# Patient Record
Sex: Female | Born: 1990 | Race: Black or African American | Marital: Single | Smoking: Never smoker
Health system: Southern US, Community
[De-identification: ages and names within clinical notes are randomized; demographics above are authoritative.]

## PROBLEM LIST (undated history)

## (undated) ENCOUNTER — Ambulatory Visit

## (undated) ENCOUNTER — Encounter

## (undated) ENCOUNTER — Telehealth

## (undated) ENCOUNTER — Encounter: Attending: Nephrology | Primary: Nephrology

## (undated) ENCOUNTER — Ambulatory Visit: Payer: PRIVATE HEALTH INSURANCE | Attending: Nephrology | Primary: Nephrology

## (undated) ENCOUNTER — Ambulatory Visit: Payer: PRIVATE HEALTH INSURANCE

## (undated) ENCOUNTER — Encounter
Attending: Student in an Organized Health Care Education/Training Program | Primary: Student in an Organized Health Care Education/Training Program

## (undated) ENCOUNTER — Ambulatory Visit: Payer: PRIVATE HEALTH INSURANCE | Attending: Surgery | Primary: Surgery

## (undated) ENCOUNTER — Telehealth: Attending: Nutritionist | Primary: Nutritionist

## (undated) ENCOUNTER — Encounter: Attending: Surgery | Primary: Surgery

## (undated) ENCOUNTER — Ambulatory Visit
Attending: Student in an Organized Health Care Education/Training Program | Primary: Student in an Organized Health Care Education/Training Program

## (undated) ENCOUNTER — Encounter: Attending: Dermatology | Primary: Dermatology

## (undated) ENCOUNTER — Ambulatory Visit: Payer: PRIVATE HEALTH INSURANCE | Attending: Dermatology | Primary: Dermatology

## (undated) ENCOUNTER — Telehealth
Attending: Student in an Organized Health Care Education/Training Program | Primary: Student in an Organized Health Care Education/Training Program

## (undated) ENCOUNTER — Encounter: Attending: Physician Assistant | Primary: Physician Assistant

## (undated) DIAGNOSIS — N289 Disorder of kidney and ureter, unspecified: Secondary | ICD-10-CM

## (undated) DIAGNOSIS — S069XAA Unspecified intracranial injury with loss of consciousness status unknown, initial encounter: Secondary | ICD-10-CM

## (undated) DIAGNOSIS — I10 Essential (primary) hypertension: Secondary | ICD-10-CM

## (undated) DIAGNOSIS — K529 Noninfective gastroenteritis and colitis, unspecified: Secondary | ICD-10-CM

## (undated) DIAGNOSIS — I509 Heart failure, unspecified: Secondary | ICD-10-CM

## (undated) DIAGNOSIS — R269 Unspecified abnormalities of gait and mobility: Secondary | ICD-10-CM

## (undated) DIAGNOSIS — Z9289 Personal history of other medical treatment: Secondary | ICD-10-CM

## (undated) DIAGNOSIS — F32A Depression, unspecified: Secondary | ICD-10-CM

## (undated) DIAGNOSIS — D649 Anemia, unspecified: Secondary | ICD-10-CM

## (undated) DIAGNOSIS — N056 Unspecified nephritic syndrome with dense deposit disease: Secondary | ICD-10-CM

## (undated) DIAGNOSIS — I469 Cardiac arrest, cause unspecified: Secondary | ICD-10-CM

## (undated) DIAGNOSIS — S069X9A Unspecified intracranial injury with loss of consciousness of unspecified duration, initial encounter: Secondary | ICD-10-CM

## (undated) DIAGNOSIS — R569 Unspecified convulsions: Secondary | ICD-10-CM

## (undated) DIAGNOSIS — J189 Pneumonia, unspecified organism: Secondary | ICD-10-CM

## (undated) DIAGNOSIS — Z8489 Family history of other specified conditions: Secondary | ICD-10-CM

## (undated) DIAGNOSIS — F329 Major depressive disorder, single episode, unspecified: Secondary | ICD-10-CM

## (undated) DIAGNOSIS — N189 Chronic kidney disease, unspecified: Secondary | ICD-10-CM

## (undated) HISTORY — PX: RENAL BIOPSY: SHX156

## (undated) HISTORY — DX: Unspecified abnormalities of gait and mobility: R26.9

## (undated) MED ORDER — CLONAZEPAM 0.5 MG TABLET: 1 mg | tablet | Freq: Two times a day (BID) | 5 refills | 30 days

## (undated) MED ORDER — CLONAZEPAM 0.5 MG TABLET: tablet | 3 refills | 0 days

---
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
## 2011-03-26 ENCOUNTER — Inpatient Hospital Stay (HOSPITAL_COMMUNITY)
Admission: EM | Admit: 2011-03-26 | Discharge: 2011-04-13 | DRG: 207 | Disposition: A | Discharge status: Rehabilitation Facility | Payer: Medicaid Other | Attending: Internal Medicine | Admitting: Internal Medicine

## 2011-03-26 ENCOUNTER — Emergency Department (HOSPITAL_COMMUNITY): Payer: Medicaid Other

## 2011-03-26 ENCOUNTER — Encounter: Payer: Self-pay | Admitting: *Deleted

## 2011-03-26 ENCOUNTER — Other Ambulatory Visit: Payer: Self-pay

## 2011-03-26 DIAGNOSIS — I501 Left ventricular failure: Secondary | ICD-10-CM

## 2011-03-26 DIAGNOSIS — I469 Cardiac arrest, cause unspecified: Secondary | ICD-10-CM | POA: Diagnosis present

## 2011-03-26 DIAGNOSIS — I428 Other cardiomyopathies: Secondary | ICD-10-CM | POA: Diagnosis present

## 2011-03-26 DIAGNOSIS — J9690 Respiratory failure, unspecified, unspecified whether with hypoxia or hypercapnia: Secondary | ICD-10-CM | POA: Diagnosis present

## 2011-03-26 DIAGNOSIS — N179 Acute kidney failure, unspecified: Secondary | ICD-10-CM | POA: Diagnosis present

## 2011-03-26 DIAGNOSIS — J9600 Acute respiratory failure, unspecified whether with hypoxia or hypercapnia: Secondary | ICD-10-CM

## 2011-03-26 DIAGNOSIS — I509 Heart failure, unspecified: Secondary | ICD-10-CM | POA: Diagnosis not present

## 2011-03-26 DIAGNOSIS — G931 Anoxic brain damage, not elsewhere classified: Secondary | ICD-10-CM | POA: Diagnosis present

## 2011-03-26 DIAGNOSIS — R0902 Hypoxemia: Secondary | ICD-10-CM | POA: Diagnosis present

## 2011-03-26 DIAGNOSIS — Z23 Encounter for immunization: Secondary | ICD-10-CM

## 2011-03-26 DIAGNOSIS — R066 Hiccough: Secondary | ICD-10-CM | POA: Diagnosis not present

## 2011-03-26 DIAGNOSIS — J810 Acute pulmonary edema: Secondary | ICD-10-CM | POA: Diagnosis present

## 2011-03-26 DIAGNOSIS — E872 Acidosis, unspecified: Secondary | ICD-10-CM | POA: Diagnosis present

## 2011-03-26 DIAGNOSIS — I110 Hypertensive heart disease with heart failure: Secondary | ICD-10-CM | POA: Diagnosis present

## 2011-03-26 DIAGNOSIS — R569 Unspecified convulsions: Secondary | ICD-10-CM | POA: Diagnosis present

## 2011-03-26 DIAGNOSIS — I10 Essential (primary) hypertension: Secondary | ICD-10-CM

## 2011-03-26 DIAGNOSIS — E876 Hypokalemia: Secondary | ICD-10-CM | POA: Diagnosis not present

## 2011-03-26 DIAGNOSIS — J189 Pneumonia, unspecified organism: Secondary | ICD-10-CM | POA: Diagnosis present

## 2011-03-26 DIAGNOSIS — D62 Acute posthemorrhagic anemia: Secondary | ICD-10-CM | POA: Diagnosis not present

## 2011-03-26 DIAGNOSIS — I429 Cardiomyopathy, unspecified: Secondary | ICD-10-CM | POA: Diagnosis not present

## 2011-03-26 DIAGNOSIS — G253 Myoclonus: Secondary | ICD-10-CM | POA: Diagnosis not present

## 2011-03-26 DIAGNOSIS — D649 Anemia, unspecified: Secondary | ICD-10-CM | POA: Diagnosis present

## 2011-03-26 HISTORY — DX: Cardiac arrest, cause unspecified: I46.9

## 2011-03-26 HISTORY — DX: Pneumonia, unspecified organism: J18.9

## 2011-03-26 LAB — COMPREHENSIVE METABOLIC PANEL
ALT: 21 U/L (ref 0–35)
AST: 47 U/L — ABNORMAL HIGH (ref 0–37)
Albumin: 2 g/dL — ABNORMAL LOW (ref 3.5–5.2)
Albumin: 1.8 g/dL — ABNORMAL LOW (ref 3.5–5.2)
Alkaline Phosphatase: 77 U/L (ref 39–117)
BUN: 19 mg/dL (ref 6–23)
BUN: 18 mg/dL (ref 6–23)
CO2: 13 mEq/L — ABNORMAL LOW (ref 19–32)
Calcium: 8.1 mg/dL — ABNORMAL LOW (ref 8.4–10.5)
Calcium: 7 mg/dL — ABNORMAL LOW (ref 8.4–10.5)
Chloride: 105 mEq/L (ref 96–112)
Chloride: 115 mEq/L — ABNORMAL HIGH (ref 96–112)
Creatinine, Ser: 2.31 mg/dL — ABNORMAL HIGH (ref 0.50–1.10)
Creatinine, Ser: 2.12 mg/dL — ABNORMAL HIGH (ref 0.50–1.10)
GFR calc Af Amer: 34 mL/min — ABNORMAL LOW (ref >90)
GFR calc non Af Amer: 29 mL/min — ABNORMAL LOW (ref >90)
GFR calc non Af Amer: 32 mL/min — ABNORMAL LOW (ref >90)
Glucose, Bld: 208 mg/dL — ABNORMAL HIGH (ref 70–99)
Potassium: 5.3 mEq/L — ABNORMAL HIGH (ref 3.5–5.1)
Sodium: 136 mEq/L (ref 135–145)
Total Bilirubin: 0.2 mg/dL — ABNORMAL LOW (ref 0.3–1.2)
Total Bilirubin: 0.2 mg/dL — ABNORMAL LOW (ref 0.3–1.2)
Total Protein: 5.4 g/dL — ABNORMAL LOW (ref 6.0–8.3)

## 2011-03-26 LAB — LACTIC ACID, PLASMA: Lactic Acid, Venous: 9.1 mmol/L — ABNORMAL HIGH (ref 0.5–2.2)

## 2011-03-26 LAB — URINE MICROSCOPIC-ADD ON

## 2011-03-26 LAB — URINALYSIS, ROUTINE W REFLEX MICROSCOPIC
Bilirubin Urine: NEGATIVE
Glucose, UA: 100 mg/dL — AB
Ketones, ur: NEGATIVE mg/dL
Leukocytes, UA: NEGATIVE
Nitrite: NEGATIVE
Protein, ur: >300 mg/dL — AB
Specific Gravity, Urine: 1.014 (ref 1.005–1.030)
Urobilinogen, UA: 0.2 mg/dL (ref 0.0–1.0)
pH: 5.5 (ref 5.0–8.0)

## 2011-03-26 LAB — RAPID URINE DRUG SCREEN, HOSP PERFORMED
Amphetamines: NOT DETECTED
Barbiturates: NOT DETECTED
Benzodiazepines: NOT DETECTED
Cocaine: NOT DETECTED
Opiates: NOT DETECTED
Tetrahydrocannabinol: NOT DETECTED

## 2011-03-26 LAB — CULTURE, BLOOD (ROUTINE X 2)
Culture  Setup Time: 201301040239
Culture  Setup Time: 201301040239
Culture: NO GROWTH
Culture: NO GROWTH

## 2011-03-26 LAB — POCT I-STAT 3, ART BLOOD GAS (G3+)
Acid-base deficit: 10 mmol/L — ABNORMAL HIGH (ref 0.0–2.0)
Bicarbonate: 17.9 mEq/L — ABNORMAL LOW (ref 20.0–24.0)
O2 Saturation: 100 %
TCO2: 19 mmol/L (ref 0–100)
pCO2 arterial: 50.6 mmHg — ABNORMAL HIGH (ref 35.0–45.0)
pH, Arterial: 7.158 — CL (ref 7.350–7.400)
pO2, Arterial: 311 mmHg — ABNORMAL HIGH (ref 80.0–100.0)

## 2011-03-26 LAB — POCT I-STAT, CHEM 8
Creatinine, Ser: 2.2 mg/dL — ABNORMAL HIGH (ref 0.50–1.10)
HCT: 26 % — ABNORMAL LOW (ref 36.0–46.0)
Hemoglobin: 8.8 g/dL — ABNORMAL LOW (ref 12.0–15.0)
Potassium: 4.9 mEq/L (ref 3.5–5.1)
Sodium: 140 mEq/L (ref 135–145)
TCO2: 15 mmol/L (ref 0–100)

## 2011-03-26 LAB — POCT PREGNANCY, URINE: Preg Test, Ur: NEGATIVE

## 2011-03-26 LAB — PROTIME-INR: Prothrombin Time: 14.9 seconds (ref 11.6–15.2)

## 2011-03-26 LAB — APTT: aPTT: 34 seconds (ref 24–37)

## 2011-03-26 LAB — POCT I-STAT TROPONIN I: Troponin i, poc: 0.05 ng/mL (ref 0.00–0.08)

## 2011-03-26 LAB — AMYLASE: Amylase: 74 U/L (ref 0–105)

## 2011-03-26 LAB — PROCALCITONIN: Procalcitonin: <0.1 ng/mL

## 2011-03-26 MED ORDER — LORAZEPAM 2 MG/ML IJ SOLN
2.0000 mg | Freq: Once | INTRAMUSCULAR | Status: AC
  Administered 2011-03-26: 2 mg via INTRAVENOUS

## 2011-03-26 MED ORDER — CISATRACURIUM BESYLATE 2 MG/ML IV SOLN
0.1000 mg/kg | Freq: Once | INTRAVENOUS | Status: AC | PRN
  Filled 2011-03-26: qty 3

## 2011-03-26 MED ORDER — ASPIRIN 81 MG PO CHEW: 324.0000 mg | CHEWABLE_TABLET | ORAL | Status: AC

## 2011-03-26 MED ORDER — CISATRACURIUM BESYLATE 2 MG/ML IV SOLN
0.1000 mg/kg | Freq: Once | INTRAVENOUS | Status: AC
  Administered 2011-03-26: 6 mg via INTRAVENOUS
  Filled 2011-03-26: qty 3

## 2011-03-26 MED ORDER — FAMOTIDINE IN NACL 20-0.9 MG/50ML-% IV SOLN
20.0000 mg | Freq: Every day | INTRAVENOUS | Status: DC
  Administered 2011-03-27 – 2011-03-30 (×4): 20 mg via INTRAVENOUS
  Filled 2011-03-26 (×5): qty 50

## 2011-03-26 MED ORDER — FENTANYL CITRATE 0.05 MG/ML IJ SOLN
100.0000 ug | Freq: Once | INTRAMUSCULAR | Status: AC
  Administered 2011-03-26: 100 ug via INTRAVENOUS

## 2011-03-26 MED ORDER — FENTANYL CITRATE 0.05 MG/ML IJ SOLN
50.0000 ug/kg | INTRAMUSCULAR | Status: DC
  Administered 2011-03-26: 50 ug via INTRAVENOUS

## 2011-03-26 MED ORDER — LABETALOL HCL 5 MG/ML IV SOLN
1.0000 mg/min | INTRAVENOUS | Status: DC
  Administered 2011-03-27: 1 mg/min via INTRAVENOUS
  Administered 2011-03-27: 20 mg/min via INTRAVENOUS
  Filled 2011-03-26: qty 100

## 2011-03-26 MED ORDER — SODIUM CHLORIDE 0.9 % IV SOLN
250.0000 mL | INTRAVENOUS | Status: DC | PRN
  Administered 2011-03-27: 500 mL via INTRAVENOUS
  Administered 2011-03-29: 250 mL via INTRAVENOUS

## 2011-03-26 MED ORDER — SODIUM CHLORIDE 0.9 % IV BOLUS (SEPSIS)
1000.0000 mL | Freq: Once | INTRAVENOUS | Status: AC
  Administered 2011-03-26: 1000 mL via INTRAVENOUS

## 2011-03-26 MED ORDER — ASPIRIN 300 MG RE SUPP
300.0000 mg | RECTAL | Status: AC
  Administered 2011-03-27: 300 mg via RECTAL
  Filled 2011-03-26: qty 1

## 2011-03-26 MED ORDER — DEXTROSE 5 % IV SOLN
0.5000 ug/min | INTRAVENOUS | Status: DC
  Administered 2011-03-27: 5 ug/min via INTRAVENOUS
  Administered 2011-03-27: 2 ug/min via INTRAVENOUS
  Administered 2011-03-28: 8 ug/min via INTRAVENOUS
  Administered 2011-03-28 (×2): 16 ug/min via INTRAVENOUS
  Administered 2011-03-28: 12 ug/min via INTRAVENOUS
  Administered 2011-03-28 – 2011-03-29 (×2): 10 ug/min via INTRAVENOUS
  Filled 2011-03-26 (×7): qty 4

## 2011-03-26 MED ORDER — MOXIFLOXACIN HCL IN NACL 400 MG/250ML IV SOLN: 400.0000 mg | INTRAVENOUS | Status: DC

## 2011-03-26 MED ORDER — PROPOFOL 10 MG/ML IV EMUL
5.0000 ug/kg/min | Freq: Once | INTRAVENOUS | Status: AC
  Administered 2011-03-26 (×2): 20 ug/kg/min via INTRAVENOUS

## 2011-03-26 MED ORDER — DEXTROSE 5 % IV SOLN
1.0000 g | INTRAVENOUS | Status: DC
  Administered 2011-03-27 – 2011-04-02 (×7): 1 g via INTRAVENOUS
  Filled 2011-03-26 (×7): qty 10

## 2011-03-26 MED ORDER — SODIUM CHLORIDE 0.9 % IV SOLN
120.0000 ug/h | INTRAVENOUS | Status: DC
  Administered 2011-03-27: 120 ug/h via INTRAVENOUS
  Filled 2011-03-26 (×2): qty 50

## 2011-03-26 MED ORDER — MOXIFLOXACIN HCL IN NACL 400 MG/250ML IV SOLN
400.0000 mg | INTRAVENOUS | Status: DC
  Administered 2011-03-27 (×2): 400 mg via INTRAVENOUS
  Filled 2011-03-26 (×2): qty 250

## 2011-03-26 MED ORDER — VANCOMYCIN HCL IN DEXTROSE 1-5 GM/200ML-% IV SOLN
1000.0000 mg | INTRAVENOUS | Status: DC
  Administered 2011-03-27: 1000 mg via INTRAVENOUS
  Filled 2011-03-26 (×2): qty 200

## 2011-03-26 MED ORDER — LABETALOL HCL 5 MG/ML IV SOLN
20.0000 mg | INTRAVENOUS | Status: DC | PRN
  Administered 2011-03-26 (×2): 20 mg via INTRAVENOUS
  Filled 2011-03-26 (×2): qty 4

## 2011-03-26 MED ORDER — METRONIDAZOLE IN NACL 5-0.79 MG/ML-% IV SOLN
500.0000 mg | Freq: Three times a day (TID) | INTRAVENOUS | Status: DC
  Administered 2011-03-26: 500 mg via INTRAVENOUS

## 2011-03-26 MED ORDER — OSELTAMIVIR PHOSPHATE 6 MG/ML PO SUSR
75.0000 mg | Freq: Two times a day (BID) | ORAL | Status: DC
  Filled 2011-03-26: qty 12.5

## 2011-03-26 MED ORDER — DEXTROSE 5 % IV SOLN
1.0000 g | Freq: Two times a day (BID) | INTRAVENOUS | Status: DC
  Administered 2011-03-26: 1 g via INTRAVENOUS

## 2011-03-26 MED ORDER — OSELTAMIVIR PHOSPHATE 6 MG/ML PO SUSR
75.0000 mg | Freq: Two times a day (BID) | ORAL | Status: DC
  Administered 2011-03-26: 75 mg
  Filled 2011-03-26 (×3): qty 12.5

## 2011-03-26 MED ORDER — ASPIRIN 300 MG RE SUPP: 300.0000 mg | RECTAL | Status: AC

## 2011-03-26 MED ORDER — HEPARIN SODIUM (PORCINE) 5000 UNIT/ML IJ SOLN
5000.0000 [IU] | Freq: Three times a day (TID) | INTRAMUSCULAR | Status: AC
  Administered 2011-03-27 – 2011-04-09 (×43): 5000 [IU] via SUBCUTANEOUS
  Filled 2011-03-26 (×44): qty 1

## 2011-03-26 MED ORDER — SODIUM CHLORIDE 0.9 % IV SOLN
1.0000 ug/kg/min | INTRAVENOUS | Status: DC
  Administered 2011-03-26: 1 ug/kg/min via INTRAVENOUS
  Administered 2011-03-28: 1.5 ug/kg/min via INTRAVENOUS
  Filled 2011-03-26 (×2): qty 20

## 2011-03-26 MED ORDER — ARTIFICIAL TEARS OP OINT
1.0000 | TOPICAL_OINTMENT | Freq: Three times a day (TID) | OPHTHALMIC | Status: DC
  Administered 2011-03-27 – 2011-03-31 (×14): 1 via OPHTHALMIC
  Filled 2011-03-26: qty 3.5

## 2011-03-26 MED ORDER — VANCOMYCIN HCL IN DEXTROSE 1-5 GM/200ML-% IV SOLN
1000.0000 mg | Freq: Once | INTRAVENOUS | Status: AC
  Administered 2011-03-26: 1000 mg via INTRAVENOUS

## 2011-03-26 MED ORDER — OSELTAMIVIR PHOSPHATE 75 MG PO CAPS: 75.0000 mg | ORAL_CAPSULE | Freq: Two times a day (BID) | ORAL | Status: DC

## 2011-03-26 MED ORDER — CISATRACURIUM BOLUS VIA INFUSION
0.0500 mg/kg | Freq: Once | INTRAVENOUS | Status: AC | PRN
  Filled 2011-03-26: qty 3

## 2011-03-26 NOTE — Consult Note (Signed)
Reason for Consult:PEA cardiac arrest/decompensated heart failure Referring Physician: CCM  Heather Sims is an 21 y.o. female.  HPI: Patient is 21 year old female with no significant past medical history except for recently diagnosed hypertension history of Viral syndrome/pneumonia treated with Avelox approximately to 3 weeks ago [facility] without improvement complained of progressive increasing shortness of breath and today while going to cookout fast food parking lot suddenly collapsed requiring CPR patient was noted to be in PEA cardiac arrest and spontaneously regained pulse without any IV epinephrine. Patient was noted to be hypertensive with blood pressure about 200 systolic and about 1 20 diastolic and was noted to be in acute respiratory failure requiring intubation in ER patient presently is intubated unresponsive on paralytics and on artic sun. As per family prior to this episode patient the was in fairly good health except for viral syndrome/pneumonia per family patient was noted to have enlarged heart on chest x-ray had [facility] and was noted to have very high blood pressures last week and was started on antihypertensive medications there is no history of exertional chest pain nausea vomiting diaphoresis and recent past no history of palpitation lightheadedness or syncope in the past no history of PND orthopnea or leg swelling in the past EKG done in the ER showed a sinus tach with LVH and nonspecific ST-T wave changes No past medical history on file.  No past surgical history on file.  No family history on file.  Social History:  does not have a smoking history on file. She does not have any smokeless tobacco history on file. Her alcohol and drug histories not on file.  Allergies: No Known Allergies  Medications: I have reviewed the patient's current medications.  Results for orders placed during the hospital encounter of 03/26/11 (from the past 48 hour(s))  TYPE AND SCREEN      Status: Normal   Collection Time   03/26/11  8:05 PM      Component Value Range Comment   ABO/RH(D) O POS      Antibody Screen NEG      Sample Expiration 03/29/2011     ABO/RH     Status: Normal (Preliminary result)   Collection Time   03/26/11  8:05 PM      Component Value Range Comment   ABO/RH(D) O POS     COMPREHENSIVE METABOLIC PANEL     Status: Abnormal   Collection Time   03/26/11  8:22 PM      Component Value Range Comment   Sodium 136  135 - 145 (mEq/L)    Potassium 5.3 (*) 3.5 - 5.1 (mEq/L) HEMOLYSIS AT THIS LEVEL MAY AFFECT RESULT   Chloride 105  96 - 112 (mEq/L)    CO2 13 (*) 19 - 32 (mEq/L)    Glucose, Bld 208 (*) 70 - 99 (mg/dL)    BUN 19  6 - 23 (mg/dL)    Creatinine, Ser 1.61 (*) 0.50 - 1.10 (mg/dL)    Calcium 8.1 (*) 8.4 - 10.5 (mg/dL)    Total Protein 5.4 (*) 6.0 - 8.3 (g/dL)    Albumin 2.0 (*) 3.5 - 5.2 (g/dL)    AST 47 (*) 0 - 37 (U/L) HEMOLYSIS AT THIS LEVEL MAY AFFECT RESULT   ALT 21  0 - 35 (U/L)    Alkaline Phosphatase 77  39 - 117 (U/L)    Total Bilirubin 0.2 (*) 0.3 - 1.2 (mg/dL)    GFR calc non Af Amer 29 (*) >90 (mL/min)    GFR  calc Af Amer 34 (*) >90 (mL/min)   LACTIC ACID, PLASMA     Status: Abnormal   Collection Time   03/26/11  8:23 PM      Component Value Range Comment   Lactic Acid, Venous 9.1 (*) 0.5 - 2.2 (mmol/L)   URINALYSIS, ROUTINE W REFLEX MICROSCOPIC     Status: Abnormal   Collection Time   03/26/11  8:41 PM      Component Value Range Comment   Color, Urine YELLOW  YELLOW     APPearance CLOUDY (*) CLEAR     Specific Gravity, Urine 1.014  1.005 - 1.030     pH 5.5  5.0 - 8.0     Glucose, UA 100 (*) NEGATIVE (mg/dL)    Hgb urine dipstick LARGE (*) NEGATIVE     Bilirubin Urine NEGATIVE  NEGATIVE     Ketones, ur NEGATIVE  NEGATIVE (mg/dL)    Protein, ur >409 (*) NEGATIVE (mg/dL)    Urobilinogen, UA 0.2  0.0 - 1.0 (mg/dL)    Nitrite NEGATIVE  NEGATIVE     Leukocytes, UA NEGATIVE  NEGATIVE    URINE RAPID DRUG SCREEN (HOSP PERFORMED)      Status: Normal   Collection Time   03/26/11  8:41 PM      Component Value Range Comment   Opiates NONE DETECTED  NONE DETECTED     Cocaine NONE DETECTED  NONE DETECTED     Benzodiazepines NONE DETECTED  NONE DETECTED     Amphetamines NONE DETECTED  NONE DETECTED     Tetrahydrocannabinol NONE DETECTED  NONE DETECTED     Barbiturates NONE DETECTED  NONE DETECTED    URINE MICROSCOPIC-ADD ON     Status: Abnormal   Collection Time   03/26/11  8:41 PM      Component Value Range Comment   Squamous Epithelial / LPF MANY (*) RARE     WBC, UA 11-20  <3 (WBC/hpf)    RBC / HPF TOO NUMEROUS TO COUNT  <3 (RBC/hpf)    Bacteria, UA FEW (*) RARE     Casts HYALINE CASTS (*) NEGATIVE     Urine-Other AMORPHOUS URATES/PHOSPHATES     POCT I-STAT 3, BLOOD GAS (G3+)     Status: Abnormal   Collection Time   03/26/11  9:03 PM      Component Value Range Comment   pH, Arterial 7.158 (*) 7.350 - 7.400     pCO2 arterial 50.6 (*) 35.0 - 45.0 (mmHg)    pO2, Arterial 311.0 (*) 80.0 - 100.0 (mmHg)    Bicarbonate 17.9 (*) 20.0 - 24.0 (mEq/L)    TCO2 19  0 - 100 (mmol/L)    O2 Saturation 100.0      Acid-base deficit 10.0 (*) 0.0 - 2.0 (mmol/L)    Collection site RADIAL, ALLEN'S TEST ACCEPTABLE      Drawn by RT      Sample type ARTERIAL      Comment NOTIFIED PHYSICIAN     POCT PREGNANCY, URINE     Status: Normal   Collection Time   03/26/11  9:04 PM      Component Value Range Comment   Preg Test, Ur NEGATIVE     POCT I-STAT TROPONIN I     Status: Normal   Collection Time   03/26/11  9:30 PM      Component Value Range Comment   Troponin i, poc 0.05  0.00 - 0.08 (ng/mL)    Comment 3  PROTIME-INR     Status: Normal   Collection Time   03/26/11  9:31 PM      Component Value Range Comment   Prothrombin Time 14.9  11.6 - 15.2 (seconds)    INR 1.15  0.00 - 1.49    APTT     Status: Normal   Collection Time   03/26/11  9:31 PM      Component Value Range Comment   aPTT 34  24 - 37 (seconds)   POCT I-STAT, CHEM  8     Status: Abnormal   Collection Time   03/26/11  9:32 PM      Component Value Range Comment   Sodium 140  135 - 145 (mEq/L)    Potassium 4.9  3.5 - 5.1 (mEq/L)    Chloride 113 (*) 96 - 112 (mEq/L)    BUN 23  6 - 23 (mg/dL)    Creatinine, Ser 4.09 (*) 0.50 - 1.10 (mg/dL)    Glucose, Bld 811 (*) 70 - 99 (mg/dL)    Calcium, Ion 9.14 (*) 1.12 - 1.32 (mmol/L)    TCO2 15  0 - 100 (mmol/L)    Hemoglobin 8.8 (*) 12.0 - 15.0 (g/dL)    HCT 78.2 (*) 95.6 - 46.0 (%)     Ct Head Wo Contrast  03/26/2011  *RADIOLOGY REPORT*  Clinical Data: Sudden cardiac arrest of unknown etiology.  CT HEAD WITHOUT CONTRAST  Technique:  Contiguous axial images were obtained from the base of the skull through the vertex without contrast.  Comparison: None.  Findings: The ventricles and sulci are symmetrical.  No mass effect or midline shift.  No abnormal extra-axial fluid collections. Ventricles are not dilated.  Gray-white matter junctions are distinct.  Basal cisterns are not effaced.  No evidence of acute intracranial hemorrhage.  No depressed skull fractures.  Paranasal sinuses are not opacified.  IMPRESSION: No evidence of acute intracranial hemorrhage, mass lesion, or acute infarct.  Original Report Authenticated By: Marlon Pel, M.D.   Dg Chest Port 1 View  03/26/2011  *RADIOLOGY REPORT*  Clinical Data: Intubated.  PORTABLE CHEST - 1 VIEW  Comparison: None.  Findings: Endotracheal tube is 1.6 cm above the carina.  There is cardiomegaly.  Diffuse bilateral airspace disease.  Probable small right effusion.  No acute bony abnormality.  NG tube enters the stomach.  IMPRESSION: Endotracheal tube 1.6 cm above the carina.  Cardiomegaly with diffuse bilateral airspace disease, edema versus infection.  Small right effusion.  Original Report Authenticated By: Cyndie Chime, M.D.   Dg Chest Port 1v Same Day  03/26/2011  *RADIOLOGY REPORT*  Clinical Data: Central line placement.  PORTABLE CHEST - 1 VIEW SAME DAY   Comparison: 03/26/2011  Findings: Right central line has been placed with the tip in the SVC.  No pneumothorax.  Endotracheal tube remains less than 2 cm above the carina.  Diffuse bilateral airspace disease again noted with cardiomegaly and small right effusion.  No real change in the appearance of the lungs.  IMPRESSION: Right central line tip in the SVC.  No pneumothorax.  Otherwise no change.  Original Report Authenticated By: Cyndie Chime, M.D.    Review of Systems  : intubated   Blood pressure 169/127, pulse 105, temperature 95.5 F (35.3 C), temperature source Other (Comment), resp. rate 22, height 5\' 1"  (1.549 m), weight 60 kg (132 lb 4.4 oz), SpO2 97.00%. Physical Exam  HENT:  Head: Normocephalic.  Eyes: Conjunctivae are normal. Pupils are equal, round,  and reactive to light. No scleral icterus.  Neck: Normal range of motion. Neck supple. No JVD present.  Cardiovascular: Normal rate and regular rhythm.   Murmur (soft systolic murmur noted) heard. Respiratory:       Bilateral occasional rhonchi or with basilar rales   GI: Soft. Bowel sounds are normal. She exhibits no distension.  Musculoskeletal: She exhibits no edema and no tenderness.  Lymphadenopathy:    She has no cervical adenopathy.    Assessment/Plan: Status post PEA cardiac arrest probably secondary to acute respiratory hypoxic failure/ARDS Probable acute pulmonary edema in view of recent viral syndrome Hypertensive emergency with acute renal injury/anemia rule out a goodpauster syndrome sign Acute renal failure Anemia Plan Check serial enzymes and EKG Start IV nitro drip Check 2-D echo in a.m. Start hydralazine afterload reducer per orders Check labs in a.m.  Sharlot Sturkey N 03/26/2011, 11:03 PM

## 2011-03-26 NOTE — ED Notes (Signed)
Propofol increased to rate of from per EDP Kohut.

## 2011-03-26 NOTE — Progress Notes (Signed)
ANTIBIOTIC CONSULT NOTE - INITIAL  Pharmacy Consult for ceftriaxone, vancomycin, moxifloxacin Indication: rule out sepsis  No Known Allergies  Patient Measurements: Height: 5\' 1"  (154.9 cm) Weight: 132 lb 4.4 oz (60 kg) IBW/kg (Calculated) : 47.8  Adjusted Body Weight:   Vital Signs: Temp: 95.5 F (35.3 C) (01/03 2230) Temp src: Other (Comment) (01/03 2041) BP: 169/127 mmHg (01/03 2230) Pulse Rate: 105  (01/03 2230) Intake/Output from previous day:   Intake/Output from this shift:    Labs:  Basename 03/26/11 2132 03/26/11 2022  WBC -- --  HGB 8.8* --  PLT -- --  LABCREA -- --  CREATININE 2.20* 2.31*   Estimated Creatinine Clearance: 33.7 ml/min (by C-G formula based on Cr of 2.2). No results found for this basename: VANCOTROUGH:2,VANCOPEAK:2,VANCORANDOM:2,GENTTROUGH:2,GENTPEAK:2,GENTRANDOM:2,TOBRATROUGH:2,TOBRAPEAK:2,TOBRARND:2,AMIKACINPEAK:2,AMIKACINTROU:2,AMIKACIN:2, in the last 72 hours   Microbiology: No results found for this or any previous visit (from the past 720 hour(s)).  Medical History: No past medical history on file.  Medications:  Scheduled:    . artificial tears  1 application Both Eyes Q8H  . aspirin  324 mg Oral NOW   Or  . aspirin  300 mg Rectal NOW  . aspirin  300 mg Rectal NOW  . cisatracurium  0.1 mg/kg Intravenous Once  . famotidine (PEPCID) IV  20 mg Intravenous Q12H  . fentaNYL  100 mcg Intravenous Once  . heparin  5,000 Units Subcutaneous Q8H  . LORazepam  2 mg Intravenous Once  . moxifloxacin  400 mg Intravenous Q24H  . oseltamivir  75 mg Oral BID  . propofol  5-70 mcg/kg/min Intravenous Once  . sodium chloride  1,000 mL Intravenous Once  . sodium chloride  1,000 mL Intravenous Once  . vancomycin  1,000 mg Intravenous Once  . DISCONTD: ceFEPime (MAXIPIME) IV  1 g Intravenous Q12H  . DISCONTD: metronidazole  500 mg Intravenous Q8H   Assessment: 21 yr old female who has been treated for "walking pneumonia" for a couple of weeks  went into respiratory arrest and PEA arrest when EMS arrived. Received cefepime 1 Gm, Vancomycin 1 Gm and metronidazole 500 mg in ED.  Goal of Therapy:  Vancomycin trough level 15-20 mcg/ml  Plan:  Ceftriaxone 1 GM q24 hrs. Moxifloxacin 400 mg IV q24 hrs. Vancomycin 1 Gm q24 hrs. Will follow renal function and adjust doses as necessary. Vanc levels when appropriate.  Eugene Garnet 03/26/2011,11:01 PM

## 2011-03-26 NOTE — ED Notes (Signed)
Per EMS: pt was at cookout walked toward EMS and collasped. Pt was in PEA rate of 40, CPR started with Lucus, pt intubated and after a few rounds of CPR without any medications pt pulses returned in a sinus tach. Pt has unknown history except for recent pneumonia.

## 2011-03-26 NOTE — H&P (Signed)
Name: Heather Sims MRN: [id number] DOB: 09-Feb-1991  LOS: 0  CRITICAL CARE ADMISSION NOTE  History of Present Illness: 21 y/o female with no past medical history presented to the [facility] ED after respiratory arrest this evening.  Her mother states that she has had URI symptoms for the past month including cough and shortness of breath.  She was treated with one week of avelox for "walking pneumonia" two weeks ago.  Her symptoms did not improve so she was seen again in urgent care and given an albuterol inhaler and an antihypertensive because she had profoundly elevated blood pressure.  She continued to have orthopnea, pnd, and shortness of breath in the week prior to this admission.  On the night of admission she had the sudden onset of shortness of breath while out with a friend getting food.  He says that he called 911 and she was still talking when EMS arrived, but suddenly developed respiratory arrest after their arrival.  EMS performed CPR for PEA arrest for less than five minutes and she regained a pulse with no meds.  Upon arrival to the [facility] ED she was intubated and started on the arctic sun protocol.  She was hypertensive after arrival.  PCCM consulted for admission.  Lines / Drains: 03/26/11 ETT >> 03/26/11 R IJ CVL >> 03/26/11 R fem CVL >> 03/26/11 L radial a-line >>  Cultures / Sepsis markers: 03/26/11 blood cx x2 >> 03/26/11 sputum cx >> 03/26/11 flu >> 03/26/11 strep/leg ur ag >>  Antibiotics: 03/26/11 vanc >> 03/26/11 ceftriaxone >> 03/26/11 moxi >> 03/26/11 tamiflu >>  Tests / Events: 03/26/11 CT Head >>     No past medical history on file. No past surgical history on file. Prior to Admission medications   Not on File   Allergies not on file No family history on file. Social History  does not have a smoking history on file. She does not have any smokeless tobacco history on file. Her alcohol and drug histories not on file.  Review Of Systems   Cannot obtain  Vital Signs:   Filed Vitals:     03/26/11 2130  BP: 137/81  Pulse: 94  Temp: 94.3 F (34.6 C)  Resp: 17    Physical Examination: Gen: no purposeful movements on vent HEENT: NCAT, PERRL, EOMi, OP clear, ETT in place Neck: supple without masses PULM: coarse breath sounds bilaterally CV: tachy, no mgr, no clear JVD AB: BS infrequent, soft, nontender, no hsm Ext: cool, no edema, no clubbing, no cyanosis Derm: no rash or skin breakdown Neuro: sedated on vent, perrl Psyche: cannot assess  Labs and Imaging:    CBC    Component Value Date/Time   HGB 8.8* 03/26/2011 2132   HCT 26.0* 03/26/2011 2132    BMET    Component Value Date/Time   NA 140 03/26/2011 2132   K 4.9 03/26/2011 2132   CL 113* 03/26/2011 2132   CO2 13* 03/26/2011 2022   GLUCOSE 211* 03/26/2011 2132   BUN 23 03/26/2011 2132   CREATININE 2.20* 03/26/2011 2132   CALCIUM 8.1* 03/26/2011 2022   GFRNONAA 29* 03/26/2011 2022   GFRAA 34* 03/26/2011 2022     Assessment and Plan:  21 y/o female with no past medical history who presented with the acute onset of shortness of breath and then respiratory failure this evening after one month of URI symptoms and progressive shortness of breath.  Objectively she has pulmonary edema and a large heart on CXR and is profoundly hypertensive  with acute renal failure.  DDx includes acute LV failure vs. ARDS from pneumonia (bacterial or viral) vs. a less likely etiology such as a vasculitis.  Given CXR findings less likely to be PE.    Respiratory failure (03/26/2011)   Assessment: Due to pulmonary edema (most likely) vs. Pneumonia.   Plan:  -full vent support -Cardiology consult now -coox now -2D TTE per cardiology -may need nitroglycerine gtt or hydralazine tonight for afterload reduction -consider lasix tonight based on BP/renal function  Cardiac arrest   Assessment: unresponsive on arrival here   Plan: -arctic sun protocol  AKI (acute kidney injury) (03/26/2011)   Assessment: due to lack of forward flow?   Plan:   -follow uop -u/a and urine lytes now -follow bmet  Acute heart failure (03/26/2011)   Assessment: non-ischaemic, viral? EKG without clear ischaemia   Plan:  -follow up cards recs -cardiac enzymes  Pneumonia (03/26/2011)   Assessment: community acquired, unclear if aspirated as part of code   Plan:  -vanc/ceftriaxone/moxi -tamiflu until rapid flu back -sputum culture  Anemia (03/26/2011)   Assessment: uncertain etiology, no clear evidence of bleed   Plan:  -serial hct now  Family updated at bedside.  Best practices / Disposition: ICU status on PCCM service  Feeding/protein malnutrition: npo Analgesia: fent Sedation: propofol Thromboprophylaxis: sub q hep HOB >30 degrees Ulcer prophylaxis: famotidine Glucose control/hyperglycemia: follow cbg  The patient is critically ill with multiple organ systems failure and requires high complexity decision making for assessment and support, frequent evaluation and titration of therapies, application of advanced monitoring technologies and extensive interpretation of multiple databases. Critical Care Time devoted to patient care services described in this note is 90 minutes.  Heber [place], M.D. Pulmonary and Critical Care Medicine [facility] Pager: [phone]  03/26/2011, 9:56 PM

## 2011-03-26 NOTE — ED Notes (Signed)
[id number] Ready

## 2011-03-26 NOTE — Progress Notes (Signed)
Paged to ED for CPR in progress. Assiisted in getting family to family room. Had prayer with family and offered pastoral support. Took mother and step-father back to see patient before patient was moved. Patient was moved to 2913. Assisted in getting family to 2900 waiting room and informed staff that family was in waiting room.

## 2011-03-26 NOTE — ED Notes (Signed)
Pt emeis through OG tube .

## 2011-03-26 NOTE — ED Notes (Signed)
Pt family singed consent for [employer] placement EJ, belongings taken to family.

## 2011-03-26 NOTE — ED Notes (Signed)
Bolus of 32mcg/kg/min prior to administration of propofol 63mcg/kg/min.

## 2011-03-26 NOTE — ED Notes (Addendum)
Pt transported to CT with RT, RN and NT. CT aware pt being transported.

## 2011-03-26 NOTE — Procedures (Signed)
Central Venous Catheter Insertion Procedure Note Heather Sims [id number] [birth year]  Procedure: Insertion of Central Venous Catheter Indications: Assessment of intravascular volume and Drug and/or fluid administration  Procedure Details Consent: Risks of procedure as well as the alternatives and risks of each were explained to the (patient/caregiver).  Consent for procedure obtained. Time Out: Verified patient identification, verified procedure, site/side was marked, verified correct patient position, special equipment/implants available, medications/allergies/relevent history reviewed, required imaging and test results available.  Performed  Maximum sterile technique was used including antiseptics, cap, gloves, gown, hand hygiene, mask and sheet. Skin prep: Chlorhexidine; local anesthetic administered A antimicrobial bonded/coated triple lumen catheter was placed in the right internal jugular vein using the Seldinger technique. Ultrasound used for site verification.  Evaluation Blood flow good Complications: No apparent complications Patient did tolerate procedure well. Chest X-ray ordered to verify placement.  CXR: pending.  MCQUAID, DOUGLAS 03/26/2011, 10:12 PM

## 2011-03-26 NOTE — ED Notes (Signed)
Paged Critical Care and spoke with [facility]

## 2011-03-26 NOTE — ED Provider Notes (Signed)
History     CSN: [id number]  Arrival date & time 03/26/11  1308   First MD Initiated Contact with Patient 03/26/11 2029      Chief Complaint  Patient presents with  . Cardiac Arrest    (Consider location/radiation/quality/duration/timing/severity/associated sxs/prior treatment) HPI history from EMS Level V caveat for intubated Patient is a 21 year old female who presents with respiratory failure and CPR in the field. Per EMS report, they were called to scene (cookout parking lot) for respiratory distress. As patient was walking towards them, she collapsed. She was pulseless and received ACLS. An airway was established. There is poor about 5 minutes of CPR. Patient regained spontaneous circulation and maintained her circulation in route.  People at scene report that patient reportedly recently had a pneumonia. No reported drug ingestion.  Past Medical History  Diagnosis Date  . Pneumonia last 2 weeks    'walking pneumonia'    No past surgical history on file.  No family history on file.  History  Substance Use Topics  . Smoking status: Never Smoker   . Smokeless tobacco: Not on file  . Alcohol Use: No    OB History    Grav Para Term Preterm Abortions TAB SAB Ect Mult Living                  Review of Systems  Unable to perform ROS   Allergies  Review of patient's allergies indicates no known allergies.  Home Medications  No current outpatient prescriptions on file.  BP 148/102  Pulse 61  Temp(Src) 91.8 F (33.2 C) (Other (Comment))  Resp 24  Ht 5\' 2"  (1.575 m)  Wt 147 lb 14.9 oz (67.1 kg)  BMI 27.06 kg/m2  SpO2 100%  LMP 03/02/2011  Physical Exam  Nursing note and vitals reviewed. Constitutional:       Well-developed female Intubated Unresponsive  HENT:  Head: Normocephalic and atraumatic.  Eyes: Pupils are equal, round, and reactive to light.       Pupils equal and reactive to light. Mild disconjugate gaze.  Neck:       Atraumatic    Cardiovascular: Regular rhythm.        Tachycardia  Pulmonary/Chest:       Status post intubation. Good breath sounds bilaterally with no breath sounds over the epigastrium.  Abdominal: Soft. She exhibits no distension.  Musculoskeletal: She exhibits no edema.  Neurological:       Unresponsive Not moving extremities  Skin: Skin is warm.    ED Course  CENTRAL LINE Performed by: Milus Glazier Authorized by: Milus Glazier Consent: The procedure was performed in an emergent situation. Indications: vascular access Patient sedated: Unresponsive. Preparation: skin prepped with 2% chlorhexidine Skin prep agent dried: skin prep agent completely dried prior to procedure Sterile barriers: all five maximum sterile barriers used - cap, mask, sterile gown, sterile gloves, and large sterile sheet Hand hygiene: hand hygiene performed prior to central venous catheter insertion Location details: right femoral Catheter type: triple lumen Pre-procedure: landmarks identified Ultrasound guidance: no Number of attempts: 2 Successful placement: yes Post-procedure: line sutured and dressing applied Assessment: blood return through all parts and free fluid flow Patient tolerance: Patient tolerated the procedure well with no immediate complications.   (including critical care time)    Labs Reviewed  COMPREHENSIVE METABOLIC PANEL - Abnormal; Notable for the following:    Potassium 5.3 (*) HEMOLYSIS AT THIS LEVEL MAY AFFECT RESULT   CO2 13 (*)    Glucose, Bld 208 (*)  Creatinine, Ser 2.31 (*)    Calcium 8.1 (*)    Total Protein 5.4 (*)    Albumin 2.0 (*)    AST 47 (*) HEMOLYSIS AT THIS LEVEL MAY AFFECT RESULT   Total Bilirubin 0.2 (*)    GFR calc non Af Amer 29 (*)    GFR calc Af Amer 34 (*)    All other components within normal limits  LACTIC ACID, PLASMA - Abnormal; Notable for the following:    Lactic Acid, Venous 9.1 (*)    All other components within normal limits  URINALYSIS,  ROUTINE W REFLEX MICROSCOPIC - Abnormal; Notable for the following:    APPearance CLOUDY (*)    Glucose, UA 100 (*)    Hgb urine dipstick LARGE (*)    Protein, ur >300 (*)    All other components within normal limits  POCT I-STAT 3, BLOOD GAS (G3+) - Abnormal; Notable for the following:    pH, Arterial 7.158 (*)    pCO2 arterial 50.6 (*)    pO2, Arterial 311.0 (*)    Bicarbonate 17.9 (*)    Acid-base deficit 10.0 (*)    All other components within normal limits  URINE MICROSCOPIC-ADD ON - Abnormal; Notable for the following:    Squamous Epithelial / LPF MANY (*)    Bacteria, UA FEW (*)    Casts HYALINE CASTS (*)    All other components within normal limits  BASIC METABOLIC PANEL - Abnormal; Notable for the following:    Chloride 113 (*)    CO2 18 (*)    Glucose, Bld 130 (*)    Creatinine, Ser 1.95 (*)    Calcium 7.1 (*)    GFR calc non Af Amer 36 (*)    GFR calc Af Amer 41 (*)    All other components within normal limits  POCT I-STAT, CHEM 8 - Abnormal; Notable for the following:    Chloride 113 (*)    Creatinine, Ser 2.20 (*)    Glucose, Bld 211 (*)    Calcium, Ion 1.09 (*)    Hemoglobin 8.8 (*)    HCT 26.0 (*)    All other components within normal limits  CARDIAC PANEL(CRET KIN+CKTOT+MB+TROPI) - Abnormal; Notable for the following:    Total CK 2085 (*)    CK, MB 14.5 (*)    Troponin I 0.35 (*)    All other components within normal limits  COMPREHENSIVE METABOLIC PANEL - Abnormal; Notable for the following:    Potassium 5.4 (*)    Chloride 115 (*)    Glucose, Bld 125 (*)    Creatinine, Ser 2.12 (*)    Calcium 7.0 (*)    Total Protein 4.5 (*)    Albumin 1.8 (*)    AST 53 (*)    Total Bilirubin 0.2 (*)    GFR calc non Af Amer 32 (*)    GFR calc Af Amer 37 (*)    All other components within normal limits  PRO B NATRIURETIC PEPTIDE - Abnormal; Notable for the following:    Pro B Natriuretic peptide (BNP) 11557.0 (*)    All other components within normal limits    CARBOXYHEMOGLOBIN - Abnormal; Notable for the following:    Total hemoglobin 8.6 (*)    All other components within normal limits  URINALYSIS, ROUTINE W REFLEX MICROSCOPIC - Abnormal; Notable for the following:    Hgb urine dipstick LARGE (*)    Protein, ur >300 (*)    All other components within  normal limits  LIPID PANEL - Abnormal; Notable for the following:    Cholesterol 296 (*)    LDL Cholesterol 213 (*)    All other components within normal limits  CBC - Abnormal; Notable for the following:    WBC 14.8 (*)    RBC 2.71 (*)    Hemoglobin 8.1 (*)    HCT 23.9 (*)    All other components within normal limits  GLUCOSE, CAPILLARY - Abnormal; Notable for the following:    Glucose-Capillary 104 (*)    All other components within normal limits  GLUCOSE, CAPILLARY - Abnormal; Notable for the following:    Glucose-Capillary 116 (*)    All other components within normal limits  GLUCOSE, CAPILLARY - Abnormal; Notable for the following:    Glucose-Capillary 107 (*)    All other components within normal limits  TYPE AND SCREEN  URINE RAPID DRUG SCREEN (HOSP PERFORMED)  POCT PREGNANCY, URINE  ABO/RH  PROTIME-INR  APTT  POCT I-STAT TROPONIN I  AMYLASE  LIPASE, BLOOD  PROCALCITONIN  STREP PNEUMONIAE URINARY ANTIGEN  SODIUM, URINE, RANDOM  CREATININE, URINE, RANDOM  MRSA PCR SCREENING  URINE MICROSCOPIC-ADD ON  I-STAT, CHEM 8  CULTURE, BLOOD (ROUTINE X 2)  CULTURE, BLOOD (ROUTINE X 2)  I-STAT TROPONIN I  BLOOD GAS, ARTERIAL  POCT PREGNANCY, URINE  BLOOD GAS, ARTERIAL  BASIC METABOLIC PANEL  BASIC METABOLIC PANEL  BASIC METABOLIC PANEL  BASIC METABOLIC PANEL  POCT CBG MONITORING  CARDIAC PANEL(CRET KIN+CKTOT+MB+TROPI)  CORTISOL  CBC  BLOOD GAS, ARTERIAL  DRUGS OF ABUSE SCREEN W/O ALC, ROUTINE URINE  BASIC METABOLIC PANEL  LEGIONELLA ANTIGEN, URINE  HEMOGLOBIN AND HEMATOCRIT, BLOOD  MPO/PR-3 (ANCA) ANTIBODIES  CARBOXYHEMOGLOBIN  CARBOXYHEMOGLOBIN  CULTURE,  RESPIRATORY  POCT RAPID INFLUENZA A&B  ANA  INFLUENZA PANEL BY PCR  BASIC METABOLIC PANEL  HEMOGLOBIN AND HEMATOCRIT, BLOOD  BASIC METABOLIC PANEL  BASIC METABOLIC PANEL  BASIC METABOLIC PANEL  BASIC METABOLIC PANEL  BASIC METABOLIC PANEL  BASIC METABOLIC PANEL  CARDIAC PANEL(CRET KIN+CKTOT+MB+TROPI)  HEMOGLOBIN AND HEMATOCRIT, BLOOD   Ct Head Wo Contrast  03/26/2011  *RADIOLOGY REPORT*  Clinical Data: Sudden cardiac arrest of unknown etiology.  CT HEAD WITHOUT CONTRAST  Technique:  Contiguous axial images were obtained from the base of the skull through the vertex without contrast.  Comparison: None.  Findings: The ventricles and sulci are symmetrical.  No mass effect or midline shift.  No abnormal extra-axial fluid collections. Ventricles are not dilated.  Gray-white matter junctions are distinct.  Basal cisterns are not effaced.  No evidence of acute intracranial hemorrhage.  No depressed skull fractures.  Paranasal sinuses are not opacified.  IMPRESSION: No evidence of acute intracranial hemorrhage, mass lesion, or acute infarct.  Original Report Authenticated By: Marlon Pel, M.D.   Dg Chest Port 1 View  03/26/2011  *RADIOLOGY REPORT*  Clinical Data: Intubated.  PORTABLE CHEST - 1 VIEW  Comparison: None.  Findings: Endotracheal tube is 1.6 cm above the carina.  There is cardiomegaly.  Diffuse bilateral airspace disease.  Probable small right effusion.  No acute bony abnormality.  NG tube enters the stomach.  IMPRESSION: Endotracheal tube 1.6 cm above the carina.  Cardiomegaly with diffuse bilateral airspace disease, edema versus infection.  Small right effusion.  Original Report Authenticated By: Cyndie Chime, M.D.   Dg Chest Port 1v Same Day  03/26/2011  *RADIOLOGY REPORT*  Clinical Data: Central line placement.  PORTABLE CHEST - 1 VIEW SAME DAY  Comparison: 03/26/2011  Findings:  Right central line has been placed with the tip in the SVC.  No pneumothorax.  Endotracheal tube remains  less than 2 cm above the carina.  Diffuse bilateral airspace disease again noted with cardiomegaly and small right effusion.  No real change in the appearance of the lungs.  IMPRESSION: Right central line tip in the SVC.  No pneumothorax.  Otherwise no change.  Original Report Authenticated By: Cyndie Chime, M.D.     No diagnosis found.  Diagnosis: Cardiac arrest Hypoxic respiratory failure Pneumonia Lactic acidosis Cardiomegaly   MDM   Patient was observed to collapse and arrest in front of EMS. They quickly labeled establish airway and obtain return of spontaneous circulation. On arrival, patient with pulse and tachycardia. Hypotensive. IV fluids initiated. Central line access obtained. Airway confirmed with bilateral breath sounds, end-tidal color change, equal chest rise and good oxygenation. Antibiotics empirically given with concern for respiratory secondary to recent pneumonia. Critical care involved in case early on. After some fluids, patient began to be hypertensive. This is likely secondary to agitation and patient sedated with propofol. Chest x-ray shows adequate tube placement. It also shows enlarged cardiac shadow. Bedside ultrasound performed. There is very small pericardial effusion but nothing significant to note Physiology. Vigorous LV Squeeze. No obvious RV dilation. Patient admitted to ICU. She remained with pulse in her blood pressure upon emergency department. Active cooling initiated per critical care recommendation.        Milus Glazier 03/27/11 0255

## 2011-03-27 ENCOUNTER — Inpatient Hospital Stay (HOSPITAL_COMMUNITY): Payer: Medicaid Other

## 2011-03-27 DIAGNOSIS — R0602 Shortness of breath: Secondary | ICD-10-CM

## 2011-03-27 LAB — CARBOXYHEMOGLOBIN
Methemoglobin: 0.5 % (ref 0.0–1.5)
Methemoglobin: 0.9 % (ref 0.0–1.5)
O2 Saturation: 68.1 %
O2 Saturation: 68.2 %
Total hemoglobin: 7.1 g/dL — ABNORMAL LOW (ref 12.5–16.0)
Total hemoglobin: 8.6 g/dL — ABNORMAL LOW (ref 12.5–16.0)
Total hemoglobin: 8.8 g/dL — ABNORMAL LOW (ref 12.5–16.0)

## 2011-03-27 LAB — BASIC METABOLIC PANEL
BUN: 19 mg/dL (ref 6–23)
BUN: 20 mg/dL (ref 6–23)
CO2: 18 mEq/L — ABNORMAL LOW (ref 19–32)
CO2: 18 mEq/L — ABNORMAL LOW (ref 19–32)
CO2: 18 mEq/L — ABNORMAL LOW (ref 19–32)
Calcium: 7.1 mg/dL — ABNORMAL LOW (ref 8.4–10.5)
Calcium: 7 mg/dL — ABNORMAL LOW (ref 8.4–10.5)
Chloride: 112 mEq/L (ref 96–112)
Chloride: 111 mEq/L (ref 96–112)
Chloride: 115 mEq/L — ABNORMAL HIGH (ref 96–112)
Creatinine, Ser: 2.05 mg/dL — ABNORMAL HIGH (ref 0.50–1.10)
Creatinine, Ser: 2.33 mg/dL — ABNORMAL HIGH (ref 0.50–1.10)
GFR calc Af Amer: 40 mL/min — ABNORMAL LOW (ref >90)
GFR calc Af Amer: 39 mL/min — ABNORMAL LOW (ref >90)
GFR calc Af Amer: 36 mL/min — ABNORMAL LOW (ref >90)
GFR calc Af Amer: 36 mL/min — ABNORMAL LOW (ref >90)
GFR calc Af Amer: 33 mL/min — ABNORMAL LOW (ref >90)
GFR calc Af Amer: 31 mL/min — ABNORMAL LOW (ref >90)
GFR calc Af Amer: 28 mL/min — ABNORMAL LOW (ref >90)
GFR calc non Af Amer: 36 mL/min — ABNORMAL LOW (ref >90)
GFR calc non Af Amer: 35 mL/min — ABNORMAL LOW (ref >90)
GFR calc non Af Amer: 31 mL/min — ABNORMAL LOW (ref >90)
GFR calc non Af Amer: 31 mL/min — ABNORMAL LOW (ref >90)
GFR calc non Af Amer: 27 mL/min — ABNORMAL LOW (ref >90)
GFR calc non Af Amer: 24 mL/min — ABNORMAL LOW (ref >90)
Glucose, Bld: 130 mg/dL — ABNORMAL HIGH (ref 70–99)
Glucose, Bld: 121 mg/dL — ABNORMAL HIGH (ref 70–99)
Glucose, Bld: 85 mg/dL (ref 70–99)
Potassium: 5.1 mEq/L (ref 3.5–5.1)
Potassium: 5 mEq/L (ref 3.5–5.1)
Potassium: 4.8 mEq/L (ref 3.5–5.1)
Potassium: 4.8 mEq/L (ref 3.5–5.1)
Potassium: 4.3 mEq/L (ref 3.5–5.1)
Potassium: 4.3 mEq/L (ref 3.5–5.1)
Sodium: 139 mEq/L (ref 135–145)
Sodium: 137 mEq/L (ref 135–145)
Sodium: 138 mEq/L (ref 135–145)
Sodium: 141 mEq/L (ref 135–145)
Sodium: 141 mEq/L (ref 135–145)
Sodium: 140 mEq/L (ref 135–145)

## 2011-03-27 LAB — GLUCOSE, CAPILLARY
Glucose-Capillary: 104 mg/dL — ABNORMAL HIGH (ref 70–99)
Glucose-Capillary: 109 mg/dL — ABNORMAL HIGH (ref 70–99)
Glucose-Capillary: 129 mg/dL — ABNORMAL HIGH (ref 70–99)
Glucose-Capillary: 106 mg/dL — ABNORMAL HIGH (ref 70–99)
Glucose-Capillary: 90 mg/dL (ref 70–99)
Glucose-Capillary: 93 mg/dL (ref 70–99)
Glucose-Capillary: 79 mg/dL (ref 70–99)
Glucose-Capillary: 77 mg/dL (ref 70–99)
Glucose-Capillary: 70 mg/dL (ref 70–99)
Glucose-Capillary: 91 mg/dL (ref 70–99)

## 2011-03-27 LAB — LIPID PANEL
Cholesterol: 296 mg/dL — ABNORMAL HIGH (ref 0–200)
HDL: 54 mg/dL (ref >39)
Total CHOL/HDL Ratio: 5.5 RATIO
VLDL: 29 mg/dL (ref 0–40)

## 2011-03-27 LAB — HEMOGLOBIN AND HEMATOCRIT, BLOOD
HCT: 22.1 % — ABNORMAL LOW (ref 36.0–46.0)
HCT: 21.8 % — ABNORMAL LOW (ref 36.0–46.0)
Hemoglobin: 7.4 g/dL — ABNORMAL LOW (ref 12.0–15.0)
Hemoglobin: 7.5 g/dL — ABNORMAL LOW (ref 12.0–15.0)

## 2011-03-27 LAB — CBC
MCH: 29.9 pg (ref 26.0–34.0)
MCH: 29.8 pg (ref 26.0–34.0)
MCHC: 33.9 g/dL (ref 30.0–36.0)
MCHC: 34.3 g/dL (ref 30.0–36.0)
Platelets: 253 10*3/uL (ref 150–400)
Platelets: 217 10*3/uL (ref 150–400)
RBC: 2.42 MIL/uL — ABNORMAL LOW (ref 3.87–5.11)

## 2011-03-27 LAB — URINE MICROSCOPIC-ADD ON

## 2011-03-27 LAB — DRUGS OF ABUSE SCREEN W/O ALC, ROUTINE URINE
Amphetamine Screen, Ur: NEGATIVE
Barbiturate Quant, Ur: NEGATIVE
Cocaine Metabolites: NEGATIVE
Creatinine,U: 114.9 mg/dL
Phencyclidine (PCP): NEGATIVE
Propoxyphene: NEGATIVE

## 2011-03-27 LAB — CARDIAC PANEL(CRET KIN+CKTOT+MB+TROPI)
CK, MB: 14.5 ng/mL (ref 0.3–4.0)
CK, MB: 13.6 ng/mL (ref 0.3–4.0)
CK, MB: 12.1 ng/mL (ref 0.3–4.0)
Relative Index: 0.7 (ref 0.0–2.5)
Relative Index: 0.7 (ref 0.0–2.5)
Total CK: 2085 U/L — ABNORMAL HIGH (ref 7–177)
Total CK: 2318 U/L — ABNORMAL HIGH (ref 7–177)
Troponin I: 0.35 ng/mL (ref <0.30)
Troponin I: <0.3 ng/mL (ref <0.30)
Troponin I: <0.3 ng/mL (ref <0.30)

## 2011-03-27 LAB — ABO/RH: ABO/RH(D): O POS

## 2011-03-27 LAB — CREATININE, URINE, RANDOM: Creatinine, Urine: 50.14 mg/dL

## 2011-03-27 LAB — STREP PNEUMONIAE URINARY ANTIGEN: Strep Pneumo Urinary Antigen: NEGATIVE

## 2011-03-27 LAB — URINALYSIS, ROUTINE W REFLEX MICROSCOPIC
Leukocytes, UA: NEGATIVE
Nitrite: NEGATIVE
Protein, ur: >300 mg/dL — AB
Specific Gravity, Urine: 1.012 (ref 1.005–1.030)
Urobilinogen, UA: 0.2 mg/dL (ref 0.0–1.0)

## 2011-03-27 LAB — SODIUM, URINE, RANDOM: Sodium, Ur: 116 mEq/L

## 2011-03-27 LAB — PRO B NATRIURETIC PEPTIDE: Pro B Natriuretic peptide (BNP): 11557 pg/mL — ABNORMAL HIGH (ref 0–125)

## 2011-03-27 LAB — CORTISOL: Cortisol, Plasma: 25.6 ug/dL

## 2011-03-27 LAB — MRSA PCR SCREENING: MRSA by PCR: NEGATIVE

## 2011-03-27 LAB — HIV ANTIBODY (ROUTINE TESTING W REFLEX): HIV: NONREACTIVE

## 2011-03-27 LAB — PROTIME-INR: Prothrombin Time: 15.6 seconds — ABNORMAL HIGH (ref 11.6–15.2)

## 2011-03-27 LAB — LEGIONELLA ANTIGEN, URINE: Legionella Antigen, Urine: NEGATIVE

## 2011-03-27 MED ORDER — HYDRALAZINE HCL 20 MG/ML IJ SOLN
10.0000 mg | INTRAMUSCULAR | Status: DC | PRN
  Administered 2011-03-27: 10 mg via INTRAVENOUS
  Filled 2011-03-27: qty 1

## 2011-03-27 MED ORDER — BIOTENE DRY MOUTH MT LIQD
15.0000 mL | Freq: Four times a day (QID) | OROMUCOSAL | Status: DC
  Administered 2011-03-27 – 2011-04-03 (×31): 15 mL via OROMUCOSAL

## 2011-03-27 MED ORDER — SODIUM CHLORIDE 0.9 % IJ SOLN
10.0000 mL | INTRAMUSCULAR | Status: DC | PRN
  Administered 2011-04-08: 10 mL via INTRAVENOUS

## 2011-03-27 MED ORDER — MIDAZOLAM BOLUS VIA INFUSION: 1.0000 mg | Freq: Once | INTRAVENOUS | Status: DC

## 2011-03-27 MED ORDER — SODIUM CHLORIDE 0.9 % IV SOLN
4.0000 mg/h | INTRAVENOUS | Status: DC
  Administered 2011-03-27 (×3): 4 mg/h via INTRAVENOUS
  Filled 2011-03-27 (×4): qty 10

## 2011-03-27 MED ORDER — FENTANYL BOLUS VIA INFUSION
50.0000 ug | INTRAVENOUS | Status: AC
  Administered 2011-03-27: 50 ug via INTRAVENOUS

## 2011-03-27 MED ORDER — PROPOFOL 10 MG/ML IV EMUL
INTRAVENOUS | Status: AC
  Administered 2011-03-27: 1000 mg
  Filled 2011-03-27: qty 100

## 2011-03-27 MED ORDER — LABETALOL HCL 5 MG/ML IV SOLN
20.0000 mg | INTRAVENOUS | Status: AC
  Administered 2011-03-27: 20 mg via INTRAVENOUS

## 2011-03-27 MED ORDER — MIDAZOLAM BOLUS VIA INFUSION
1.0000 mg | INTRAVENOUS | Status: AC
  Administered 2011-03-27: 1 mg via INTRAVENOUS

## 2011-03-27 MED ORDER — FENTANYL BOLUS VIA INFUSION: 50.0000 ug | INTRAVENOUS | Status: DC

## 2011-03-27 MED ORDER — FENTANYL BOLUS VIA INFUSION
25.0000 ug | INTRAVENOUS | Status: AC
  Administered 2011-03-27: 25 ug via INTRAVENOUS

## 2011-03-27 MED ORDER — FENTANYL BOLUS VIA INFUSION: 25.0000 ug | Freq: Once | INTRAVENOUS | Status: DC

## 2011-03-27 MED ORDER — INFLUENZA VIRUS VACC SPLIT PF IM SUSP
0.5000 mL | INTRAMUSCULAR | Status: AC
  Filled 2011-03-27: qty 0.5

## 2011-03-27 MED ORDER — LABETALOL HCL 5 MG/ML IV SOLN: 20.0000 mg | INTRAVENOUS | Status: DC

## 2011-03-27 MED ORDER — CHLORHEXIDINE GLUCONATE 0.12 % MT SOLN
15.0000 mL | Freq: Two times a day (BID) | OROMUCOSAL | Status: DC
  Administered 2011-03-27 – 2011-04-02 (×15): 15 mL via OROMUCOSAL
  Filled 2011-03-27 (×15): qty 15

## 2011-03-27 MED ORDER — SODIUM CHLORIDE 0.9 % IJ SOLN
3.0000 mL | Freq: Two times a day (BID) | INTRAMUSCULAR | Status: DC
  Administered 2011-03-27 – 2011-03-29 (×5): 3 mL via INTRAVENOUS
  Administered 2011-03-31: 10 mL via INTRAVENOUS
  Administered 2011-04-01 – 2011-04-03 (×5): 3 mL via INTRAVENOUS
  Administered 2011-04-04 – 2011-04-05 (×2): via INTRAVENOUS
  Administered 2011-04-06: 3 mL via INTRAVENOUS
  Administered 2011-04-06: 22:00:00 via INTRAVENOUS
  Administered 2011-04-10: 3 mL via INTRAVENOUS

## 2011-03-27 MED ORDER — SODIUM CHLORIDE 0.9 % IV SOLN
INTRAVENOUS | Status: DC | PRN
  Administered 2011-03-27 – 2011-03-28 (×4): via INTRAVENOUS
  Administered 2011-03-31: 500 mL via INTRAVENOUS
  Administered 2011-04-02: 01:00:00 via INTRAVENOUS

## 2011-03-27 NOTE — Progress Notes (Signed)
Subjective:  Patient remains intubated sedated unresponsive on vent. 2-D echo being done and shows moderate LVH with global hypokinesia EF approximately 35-40% there was small circumferential pericardial effusion and moderate pleural effusion Was question of a membranous VSD will arrange for TEE  Objective:  Vital Signs in the last 24 hours: Temp:  [90.3 F (32.4 C)-96.3 F (35.7 C)] 91.8 F (33.2 C) (01/04 1800) Pulse Rate:  [58-116] 71  (01/04 1800) Resp:  [14-41] 24  (01/04 1800) BP: (99-200)/(66-137) 104/81 mmHg (01/04 1800) SpO2:  [95 %-100 %] 100 % (01/04 1800) Arterial Line BP: (117-225)/(67-132) 118/76 mmHg (01/04 1800) FiO2 (%):  [29.8 %-100 %] 30.1 % (01/04 1800) Weight:  [60 kg (132 lb 4.4 oz)-69.2 kg (152 lb 8.9 oz)] 152 lb 8.9 oz (69.2 kg) (01/04 0500)  Intake/Output from previous day: 01/03 0701 - 01/04 0700 In: 685.7 [I.V.:355.7; NG/GT:30; IV Piggyback:300] Out: 445 [Urine:415; Emesis/NG output:30] Intake/Output from this shift: Total I/O In: 514.5 [I.V.:514.5] Out: 50 [Urine:50]  Physical Exam: Neck: no carotid bruit, no JVD and supple, symmetrical, trachea midline Lungs: Bilateral rhonchi and Rales noted Heart: regular rate and rhythm and S1-S2 soft there is soft systolic murmur and S3 gallop Abdomen: soft, non-tender; bowel sounds normal; no masses,  no organomegaly Extremities: extremities normal, atraumatic, no cyanosis or edema  Lab Results:  Basename 03/27/11 1800 03/27/11 1500 03/27/11 0556 03/26/11 2300  WBC -- -- 11.8* 14.8*  HGB 7.5* 7.4* -- --  PLT -- -- 217 253    Basename 03/27/11 1800 03/27/11 1440  NA 141 140  K 4.3 4.2  CL 116* 115*  CO2 18* 18*  GLUCOSE 84 82  BUN 21 21  CREATININE 2.49* 2.33*    Basename 03/27/11 1440 03/27/11 0800  TROPONINI <0.30 <0.30   Hepatic Function Panel  Basename 03/26/11 2131  PROT 4.5*  ALBUMIN 1.8*  AST 53*  ALT 21  ALKPHOS 66  BILITOT 0.2*  BILIDIR --  IBILI --    Basename 03/26/11 2300   CHOL 296*   No results found for this basename: PROTIME in the last 72 hours  Imaging: Ct Head Wo Contrast  03/26/2011  *RADIOLOGY REPORT*  Clinical Data: Sudden cardiac arrest of unknown etiology.  CT HEAD WITHOUT CONTRAST  Technique:  Contiguous axial images were obtained from the base of the skull through the vertex without contrast.  Comparison: None.  Findings: The ventricles and sulci are symmetrical.  No mass effect or midline shift.  No abnormal extra-axial fluid collections. Ventricles are not dilated.  Gray-white matter junctions are distinct.  Basal cisterns are not effaced.  No evidence of acute intracranial hemorrhage.  No depressed skull fractures.  Paranasal sinuses are not opacified.  IMPRESSION: No evidence of acute intracranial hemorrhage, mass lesion, or acute infarct.  Original Report Authenticated By: Marlon Pel, M.D.   Dg Chest Port 1 View  03/27/2011  *RADIOLOGY REPORT*  Clinical Data: Right IJ central line, check endotracheal tube position  PORTABLE CHEST - 1 VIEW  Comparison: 03/26/2011  Findings: Cardiomegaly again noted.  Endotracheal tube unchanged in position with tip 1.7 cm above the carina.  NG tube in place. Stable right IJ central line position with tip in distal SVC. Diffuse bilateral airspace disease again noted with slight improvement in aeration of the upper lobes.  Probable bilateral small pleural effusion.  IMPRESSION: Endotracheal tube unchanged in position with tip 1.7 cm above the carina.  NG tube in place.  Stable right IJ central line position with tip  in distal SVC.  Diffuse bilateral airspace disease again noted with slight improvement in aeration of the upper lobes. Probable bilateral small pleural effusion.  Original Report Authenticated By: Natasha Mead, M.D.   Dg Chest Port 1 View  03/26/2011  *RADIOLOGY REPORT*  Clinical Data: Intubated.  PORTABLE CHEST - 1 VIEW  Comparison: None.  Findings: Endotracheal tube is 1.6 cm above the carina.  There is  cardiomegaly.  Diffuse bilateral airspace disease.  Probable small right effusion.  No acute bony abnormality.  NG tube enters the stomach.  IMPRESSION: Endotracheal tube 1.6 cm above the carina.  Cardiomegaly with diffuse bilateral airspace disease, edema versus infection.  Small right effusion.  Original Report Authenticated By: Cyndie Chime, M.D.   Dg Chest Port 1v Same Day  03/26/2011  *RADIOLOGY REPORT*  Clinical Data: Central line placement.  PORTABLE CHEST - 1 VIEW SAME DAY  Comparison: 03/26/2011  Findings: Right central line has been placed with the tip in the SVC.  No pneumothorax.  Endotracheal tube remains less than 2 cm above the carina.  Diffuse bilateral airspace disease again noted with cardiomegaly and small right effusion.  No real change in the appearance of the lungs.  IMPRESSION: Right central line tip in the SVC.  No pneumothorax.  Otherwise no change.  Original Report Authenticated By: Cyndie Chime, M.D.    Cardiac Studies:  Assessment/Plan:  Status post PE a cardiac arrest Vent dependent respiratory failure Hypertensive heart disease with systolic dysfunction Status post viral syndrome rule out viral myocarditis Acute pulmonary edema Pneumonia/ARDS  Acute renal injury Anemia Questionable membranous VSD Plan Continue present management per CCM Will arrange for transesophageal echocardiogram to rule out VSD  LOS: 1 day    Dempsey Knotek N 03/27/2011, 6:56 PM

## 2011-03-27 NOTE — Progress Notes (Signed)
CRITICAL VALUE ALERT  Critical value received:  CKMB=14.5; Troponin = 0.35  Date of notification: 03/27/11  Time of notification:  0135  Critical value read back:yes  Nurse who received alert:  Ruffin Pyo   MD notified (1st page):  Dr Sung Amabile  Time of first page:  0145  MD notified (2nd page):  Time of second page:  Responding MD:  Dr Sung Amabile  Time MD responded:  [id number]

## 2011-03-27 NOTE — H&P (Addendum)
Name: Heather Sims MRN: [id number] DOB: September 15, 1990  LOS: 1  CRITICAL CARE ADMISSION NOTE  History of Present Illness: 21 y/o female with no past medical history presented to the [facility] ED after respiratory arrest this evening.  Her mother states that she has had URI symptoms for the past month including cough and shortness of breath.  She was treated with one week of avelox for "walking pneumonia" two weeks ago.  Her symptoms did not improve so she was seen again in urgent care and given an albuterol inhaler and an antihypertensive because she had profoundly elevated blood pressure.  She continued to have orthopnea, pnd, and shortness of breath in the week prior to this admission.  On the night of admission she had the sudden onset of shortness of breath while out with a friend getting food.  He says that he called 911 and she was still talking when EMS arrived, but suddenly developed respiratory arrest after their arrival.  EMS performed CPR for PEA arrest for less than five minutes and she regained a pulse with no meds.  Upon arrival to the [facility] ED she was intubated and started on the arctic sun protocol.  She was hypertensive after arrival.  PCCM consulted for admission.  Lines / Drains: 03/26/11 ETT >> 03/26/11 R IJ CVL >> 03/26/11 R fem CVL >> 03/26/11 L radial a-line >>  Cultures / Sepsis markers: 03/26/11 blood cx x2 >> 03/26/11 sputum cx >> 03/26/11 flu >> NEG 03/26/11 strep/leg ur ag >> 03/26/11 - Urine tox >> 03/27/11 HIV test >>  Antibiotics: 03/26/11 vanc >> 03/26/11 ceftriaxone >> 03/26/11 moxi >> 03/26/11 tamiflu >>03/27/11  Tests / Events: 03/26/11 CT Head >>    INTERVAL HX/OVERNIGHT/SUBJECTIVE   Still under arctic sun. Mom at bedside - no questions. Flu negative  Vital Signs:   Filed Vitals:   03/27/11 1100  BP: 125/95  Pulse: 65  Temp: 91.6 F (33.1 C)  Resp: 24    Physical Examination: Gen: no purposeful movements on vent HEENT: NCAT, PERRL, EOMi, OP clear, ETT in place Neck: supple  without masses PULM: coarse breath sounds bilaterally CV: tachy, no mgr, no clear JVD AB: BS infrequent, soft, nontender, no hsm Ext: cool, no edema, no clubbing, no cyanosis Derm: no rash or skin breakdown Neuro: sedated on vent, perrl Psyche: cannot assess  Labs and Imaging:    Ct Head Wo Contrast  03/26/2011  *RADIOLOGY REPORT*  Clinical Data: Sudden cardiac arrest of unknown etiology.  CT HEAD WITHOUT CONTRAST  Technique:  Contiguous axial images were obtained from the base of the skull through the vertex without contrast.  Comparison: None.  Findings: The ventricles and sulci are symmetrical.  No mass effect or midline shift.  No abnormal extra-axial fluid collections. Ventricles are not dilated.  Gray-white matter junctions are distinct.  Basal cisterns are not effaced.  No evidence of acute intracranial hemorrhage.  No depressed skull fractures.  Paranasal sinuses are not opacified.  IMPRESSION: No evidence of acute intracranial hemorrhage, mass lesion, or acute infarct.  Original Report Authenticated By: Marlon Pel, M.D.   Dg Chest Port 1 View  03/27/2011  *RADIOLOGY REPORT*  Clinical Data: Right IJ central line, check endotracheal tube position  PORTABLE CHEST - 1 VIEW  Comparison: 03/26/2011  Findings: Cardiomegaly again noted.  Endotracheal tube unchanged in position with tip 1.7 cm above the carina.  NG tube in place. Stable right IJ central line position with tip in distal SVC. Diffuse bilateral airspace disease again  noted with slight improvement in aeration of the upper lobes.  Probable bilateral small pleural effusion.  IMPRESSION: Endotracheal tube unchanged in position with tip 1.7 cm above the carina.  NG tube in place.  Stable right IJ central line position with tip in distal SVC.  Diffuse bilateral airspace disease again noted with slight improvement in aeration of the upper lobes. Probable bilateral small pleural effusion.  Original Report Authenticated By: Natasha Mead, M.D.     Dg Chest Port 1 View  03/26/2011  *RADIOLOGY REPORT*  Clinical Data: Intubated.  PORTABLE CHEST - 1 VIEW  Comparison: None.  Findings: Endotracheal tube is 1.6 cm above the carina.  There is cardiomegaly.  Diffuse bilateral airspace disease.  Probable small right effusion.  No acute bony abnormality.  NG tube enters the stomach.  IMPRESSION: Endotracheal tube 1.6 cm above the carina.  Cardiomegaly with diffuse bilateral airspace disease, edema versus infection.  Small right effusion.  Original Report Authenticated By: Cyndie Chime, M.D.   Dg Chest Port 1v Same Day  03/26/2011  *RADIOLOGY REPORT*  Clinical Data: Central line placement.  PORTABLE CHEST - 1 VIEW SAME DAY  Comparison: 03/26/2011  Findings: Right central line has been placed with the tip in the SVC.  No pneumothorax.  Endotracheal tube remains less than 2 cm above the carina.  Diffuse bilateral airspace disease again noted with cardiomegaly and small right effusion.  No real change in the appearance of the lungs.  IMPRESSION: Right central line tip in the SVC.  No pneumothorax.  Otherwise no change.  Original Report Authenticated By: Cyndie Chime, M.D.     CBC    Component Value Date/Time   WBC 11.8* 03/27/2011 0556   RBC 2.42* 03/27/2011 0556   HGB 7.2* 03/27/2011 0556   HCT 21.0* 03/27/2011 0556   PLT 217 03/27/2011 0556   MCV 86.8 03/27/2011 0556   MCH 29.8 03/27/2011 0556   MCHC 34.3 03/27/2011 0556   RDW 13.8 03/27/2011 0556    BMET    Component Value Date/Time   NA 141 03/27/2011 1000   K 4.3 03/27/2011 1000   CL 115* 03/27/2011 1000   CO2 20 03/27/2011 1000   GLUCOSE 104* 03/27/2011 1000   BUN 20 03/27/2011 1000   CREATININE 2.21* 03/27/2011 1000   CALCIUM 6.9* 03/27/2011 1000   GFRNONAA 31* 03/27/2011 1000   GFRAA 36* 03/27/2011 1000   Lab Results  Component Value Date   CREATININE 2.21* 03/27/2011   BUN 20 03/27/2011   NA 141 03/27/2011   K 4.3 03/27/2011   CL 115* 03/27/2011   CO2 20 03/27/2011   No components found with this basename:  RENAL:3]  Recent Labs  Basename 03/27/11 1000 03/27/11 0556 03/27/11 0400 03/27/11 0200   BUN 20 20 20 19   ]  Assessment and Plan:  21 y/o female with no past medical history who presented with the acute onset of shortness of breath and then respiratory failure this evening after one month of URI symptoms and progressive shortness of breath.  Objectively she has pulmonary edema and a large heart on CXR and is profoundly hypertensive with acute renal failure.  DDx includes acute LV failure vs. ARDS from pneumonia (bacterial or viral) vs. a less likely etiology such as a vasculitis.  Given CXR findings less likely to be PE.    Respiratory failure (03/26/2011)   Assessment: Due to pulmonary edema (most likely) vs. Pneumonia.   Plan:  -full vent support -  Cardiac arrest  Assessment: unresponsive on arrival here   Plan: -arctic sun protocol  AKI (acute kidney injury) (03/26/2011)   Assessment: due to lack of forward flow? Creatinine 2.0 and unchanged   Plan:  -follow  - await urine lytes  Acute heart failure (03/26/2011)   Assessment: non-ischaemic, viral? EKG without clear ischaemia   Plan:  -follow up cards recs -  -cardiac enzymes  Pneumonia (03/26/2011)   Assessment: community acquired, unclear if aspirated as part of code. Flu negative   Plan:  -vanc/ceftriaxone/moxi -dc tamiflu -sputum culture - check HIV 03/27/11 due to diffuse infiltrates and unclear risk  Anemia (03/26/2011)   Assessment: uncertain etiology, no clear evidence of bleed   Plan:  -serial hct now -prbc for hgb < 8gm%  Family updated at bedside.  Best practices / Disposition: ICU status on PCCM service  Feeding/protein malnutrition: npo Analgesia: fent Sedation: propofol Thromboprophylaxis: sub q hep HOB >30 degrees Ulcer prophylaxis: famotidine Glucose control/hyperglycemia: follow cbg  The patient is critically ill with multiple organ systems failure and requires high complexity decision making for  assessment and support, frequent evaluation and titration of therapies, application of advanced monitoring technologies and extensive interpretation of multiple databases. Critical Care Time devoted to patient care services described in this note is 30 minutes.  Dr. Kalman Shan, M.D., [facility].C.P Pulmonary and Critical Care Medicine Staff Physician [facility] [place] [facility] Pager: [phone], If no answer or between  15:00h - 7:00h: call [phone]    03/27/2011, 11:27 AM

## 2011-03-27 NOTE — Progress Notes (Signed)
  Echocardiogram 2D Echocardiogram has been performed.  Heather Sims Nira Retort 03/27/2011, 3:21 PM

## 2011-03-27 NOTE — Progress Notes (Signed)
*  PRELIMINARY RESULTS*  Lower Extremity Venous has been performed. Bilateral:  No evidence of DVT or Baker's Cyst.    Farrel Demark RDMS 03/27/2011, 2:58 PM

## 2011-03-28 ENCOUNTER — Inpatient Hospital Stay (HOSPITAL_COMMUNITY): Payer: Medicaid Other

## 2011-03-28 DIAGNOSIS — I501 Left ventricular failure: Secondary | ICD-10-CM

## 2011-03-28 DIAGNOSIS — N179 Acute kidney failure, unspecified: Secondary | ICD-10-CM

## 2011-03-28 DIAGNOSIS — J9600 Acute respiratory failure, unspecified whether with hypoxia or hypercapnia: Secondary | ICD-10-CM

## 2011-03-28 DIAGNOSIS — I469 Cardiac arrest, cause unspecified: Secondary | ICD-10-CM

## 2011-03-28 LAB — GLUCOSE, CAPILLARY
Glucose-Capillary: 78 mg/dL (ref 70–99)
Glucose-Capillary: 71 mg/dL (ref 70–99)
Glucose-Capillary: 69 mg/dL — ABNORMAL LOW (ref 70–99)
Glucose-Capillary: 104 mg/dL — ABNORMAL HIGH (ref 70–99)
Glucose-Capillary: 91 mg/dL (ref 70–99)

## 2011-03-28 LAB — BASIC METABOLIC PANEL
BUN: 22 mg/dL (ref 6–23)
CO2: 17 mEq/L — ABNORMAL LOW (ref 19–32)
CO2: 17 mEq/L — ABNORMAL LOW (ref 19–32)
Calcium: 7 mg/dL — ABNORMAL LOW (ref 8.4–10.5)
Calcium: 6.9 mg/dL — ABNORMAL LOW (ref 8.4–10.5)
Chloride: 115 mEq/L — ABNORMAL HIGH (ref 96–112)
Chloride: 113 mEq/L — ABNORMAL HIGH (ref 96–112)
Chloride: 112 mEq/L (ref 96–112)
Creatinine, Ser: 2.69 mg/dL — ABNORMAL HIGH (ref 0.50–1.10)
Creatinine, Ser: 2.93 mg/dL — ABNORMAL HIGH (ref 0.50–1.10)
Creatinine, Ser: 3.39 mg/dL — ABNORMAL HIGH (ref 0.50–1.10)
GFR calc Af Amer: 28 mL/min — ABNORMAL LOW (ref >90)
GFR calc Af Amer: 23 mL/min — ABNORMAL LOW (ref >90)
GFR calc Af Amer: 21 mL/min — ABNORMAL LOW (ref >90)
GFR calc non Af Amer: 24 mL/min — ABNORMAL LOW (ref >90)
GFR calc non Af Amer: 22 mL/min — ABNORMAL LOW (ref >90)
GFR calc non Af Amer: 18 mL/min — ABNORMAL LOW (ref >90)
Sodium: 140 mEq/L (ref 135–145)
Sodium: 138 mEq/L (ref 135–145)

## 2011-03-28 LAB — LACTIC ACID, PLASMA: Lactic Acid, Venous: 0.7 mmol/L (ref 0.5–2.2)

## 2011-03-28 LAB — POCT I-STAT 3, ART BLOOD GAS (G3+)
Bicarbonate: 15.6 mEq/L — ABNORMAL LOW (ref 20.0–24.0)
Patient temperature: 98.6
pCO2 arterial: 24.7 mmHg — ABNORMAL LOW (ref 35.0–45.0)
pH, Arterial: 7.409 — ABNORMAL HIGH (ref 7.350–7.400)

## 2011-03-28 LAB — CBC
MCV: 87.7 fL (ref 78.0–100.0)
Platelets: 262 10*3/uL (ref 150–400)
RBC: 2.19 MIL/uL — ABNORMAL LOW (ref 3.87–5.11)
RDW: 14.7 % (ref 11.5–15.5)
WBC: 10.2 10*3/uL (ref 4.0–10.5)

## 2011-03-28 LAB — DIFFERENTIAL
Basophils Absolute: 0.1 10*3/uL (ref 0.0–0.1)
Eosinophils Relative: 3 % (ref 0–5)
Lymphocytes Relative: 20 % (ref 12–46)
Lymphs Abs: 2.1 10*3/uL (ref 0.7–4.0)
Neutro Abs: 7 10*3/uL (ref 1.7–7.7)
Neutrophils Relative %: 69 % (ref 43–77)

## 2011-03-28 LAB — HEPATIC FUNCTION PANEL
Albumin: 1.3 g/dL — ABNORMAL LOW (ref 3.5–5.2)
Alkaline Phosphatase: 50 U/L (ref 39–117)

## 2011-03-28 LAB — RENAL FUNCTION PANEL
BUN: 22 mg/dL (ref 6–23)
Calcium: 6.9 mg/dL — ABNORMAL LOW (ref 8.4–10.5)
GFR calc Af Amer: 20 mL/min — ABNORMAL LOW (ref >90)
Glucose, Bld: 107 mg/dL — ABNORMAL HIGH (ref 70–99)
Phosphorus: 5.7 mg/dL — ABNORMAL HIGH (ref 2.3–4.6)
Sodium: 137 mEq/L (ref 135–145)

## 2011-03-28 LAB — DIRECT ANTIGLOBULIN TEST (NOT AT ARMC): DAT, IgG: NEGATIVE

## 2011-03-28 LAB — HEMOGLOBIN AND HEMATOCRIT, BLOOD
HCT: 20.8 % — ABNORMAL LOW (ref 36.0–46.0)
Hemoglobin: 7.1 g/dL — ABNORMAL LOW (ref 12.0–15.0)

## 2011-03-28 LAB — PREPARE RBC (CROSSMATCH)

## 2011-03-28 MED ORDER — DEXTROSE 50 % IV SOLN
INTRAVENOUS | Status: AC
  Administered 2011-03-28: 25 mL
  Filled 2011-03-28: qty 50

## 2011-03-28 MED ORDER — LORAZEPAM 2 MG/ML IJ SOLN
2.0000 mg | INTRAMUSCULAR | Status: DC | PRN
  Administered 2011-04-01 – 2011-04-03 (×6): 2 mg via INTRAVENOUS
  Filled 2011-03-28 (×6): qty 1

## 2011-03-28 MED ORDER — PHENYTOIN SODIUM 50 MG/ML IJ SOLN
100.0000 mg | Freq: Three times a day (TID) | INTRAMUSCULAR | Status: DC
  Administered 2011-03-28 – 2011-03-31 (×8): 100 mg via INTRAVENOUS
  Filled 2011-03-28 (×11): qty 2

## 2011-03-28 MED ORDER — FUROSEMIDE 10 MG/ML IJ SOLN
80.0000 mg | Freq: Once | INTRAMUSCULAR | Status: AC
  Administered 2011-03-28: 80 mg via INTRAVENOUS
  Filled 2011-03-28: qty 8

## 2011-03-28 MED ORDER — SODIUM CHLORIDE 0.9 % IV SOLN
1000.0000 mg | Freq: Once | INTRAVENOUS | Status: AC
  Administered 2011-03-28: 1000 mg via INTRAVENOUS
  Filled 2011-03-28 (×2): qty 20

## 2011-03-28 MED ORDER — PROPOFOL 10 MG/ML IV EMUL
5.0000 ug/kg/min | INTRAVENOUS | Status: DC
  Administered 2011-03-28: 60 ug/kg/min via INTRAVENOUS
  Administered 2011-03-28: 30.066 ug/kg/min via INTRAVENOUS
  Administered 2011-03-28 – 2011-03-29 (×2): 50 ug/kg/min via INTRAVENOUS
  Administered 2011-03-29: 30 ug/kg/min via INTRAVENOUS
  Administered 2011-03-29: 40 ug/kg/min via INTRAVENOUS
  Administered 2011-03-29: 30 ug/kg/min via INTRAVENOUS
  Administered 2011-03-29: 40 ug/kg/min via INTRAVENOUS
  Administered 2011-03-30: 10 ug/kg/min via INTRAVENOUS
  Administered 2011-03-30 (×2): 40 ug/kg/min via INTRAVENOUS
  Filled 2011-03-28 (×10): qty 100

## 2011-03-28 MED ORDER — PROPOFOL 10 MG/ML IV EMUL
INTRAVENOUS | Status: AC
  Administered 2011-03-28: 30.066 ug/kg/min via INTRAVENOUS
  Filled 2011-03-28: qty 100

## 2011-03-28 MED ORDER — SODIUM CHLORIDE 0.9 % IV SOLN
INTRAVENOUS | Status: DC
  Administered 2011-03-28 – 2011-03-30 (×2): via INTRAVENOUS
  Administered 2011-03-31: 15 mL via INTRAVENOUS

## 2011-03-28 MED ORDER — LORAZEPAM 2 MG/ML IJ SOLN
INTRAMUSCULAR | Status: AC
  Administered 2011-03-28: 12:00:00
  Filled 2011-03-28: qty 1

## 2011-03-28 MED ORDER — SODIUM CHLORIDE 0.9 % IV SOLN
500.0000 mg | Freq: Every day | INTRAVENOUS | Status: AC
  Administered 2011-03-28 – 2011-03-30 (×3): 500 mg via INTRAVENOUS
  Filled 2011-03-28 (×4): qty 4

## 2011-03-28 MED ORDER — VANCOMYCIN HCL 1000 MG IV SOLR
750.0000 mg | INTRAVENOUS | Status: DC
  Filled 2011-03-28: qty 750

## 2011-03-28 MED ORDER — PHENYTOIN SODIUM 50 MG/ML IJ SOLN
100.0000 mg | Freq: Three times a day (TID) | INTRAMUSCULAR | Status: DC
  Filled 2011-03-28 (×2): qty 2

## 2011-03-28 MED ORDER — OSMOLITE 1.2 CAL PO LIQD
1000.0000 mL | ORAL | Status: DC
  Administered 2011-03-28: 1000 mL via ORAL
  Filled 2011-03-28 (×2): qty 1000

## 2011-03-28 NOTE — Consult Note (Signed)
Renal Consult Note  Reason for Consult:Acute Renal Failure Referring Physician: PCCM  Heather Sims is an 21 y.o. female.  HPI: Heather Sims is a previously health 21 year old girl who had a cardiac arrest after respiratory arrest in the field.  She had reported pneumonia symptoms prior to the respiratory arrest, and had extremely elevated BP when she was seen at Urgent care.  She was placed on the hypothermia protocol, and rewarming was completed today.  She has cardiomegaly with a low ejection fraction without wall motion abnormalities.  She has ARDS vs. PNA.  This morning she began having generalized tonic clonic seizures.    During hospitalization, her creatinine has slowly been increasing, and she is not making much urine.   PMH:   Past Medical History  Diagnosis Date  . Pneumonia last 2 weeks    'walking pneumonia'    PSH:  No past surgical history on file.  Allergies: No Known Allergies  Medications:   Prior to Admission medications   Medication Sig Start Date End Date Taking? Authorizing Provider  albuterol (PROVENTIL HFA;VENTOLIN HFA) 108 (90 BASE) MCG/ACT inhaler Inhale 1 puff into the lungs every 4 (four) hours as needed. For shortness of breath.    Yes Historical Provider, MD   Current Medications:     . sodium chloride 10 mL/hr at 03/28/11 0115  . feeding supplement (OSMOLITE 1.2 CAL)    . norepinephrine (LEVOPHED) Adult infusion 14 mcg/min (03/28/11 1556)  . propofol 40 mcg/kg/min (03/28/11 1220)  . DISCONTD: cisatracurium (NIMBEX) infusion Stopped (03/28/11 1102)  . DISCONTD: fentaNYL infusion INTRAVENOUS Stopped (03/28/11 1153)  . DISCONTD: labetalol (NORMODYNE) infusion 20 mg/min (03/27/11 1330)  . DISCONTD: midazolam (VERSED) infusion Stopped (03/28/11 1153)      . antiseptic oral rinse  15 mL Mouth Rinse QID  . artificial tears  1 application Both Eyes Q8H  . aspirin  300 mg Rectal NOW  . cefTRIAXone (ROCEPHIN) IVPB 1 gram/50 mL D5W  1 g Intravenous Q24H  .  chlorhexidine  15 mL Mouth Rinse BID  . dextrose      . famotidine (PEPCID) IV  20 mg Intravenous QHS  . heparin  5,000 Units Subcutaneous Q8H  . influenza  inactive virus vaccine  0.5 mL Intramuscular Tomorrow-1000  . LORazepam      . phenytoin (DILANTIN) IV  1,000 mg Intravenous Once  . phenytoin (DILANTIN) IV  100 mg Intravenous Q8H  . sodium chloride  3 mL Intravenous Q12H  . DISCONTD: moxifloxacin  400 mg Intravenous Q24H  . DISCONTD: phenytoin (DILANTIN) IV  100 mg Intravenous Q8H  . DISCONTD: vancomycin  750 mg Intravenous Q24H  . DISCONTD: vancomycin  1,000 mg Intravenous Q24H     Social History:  reports that she has never smoked. She does not have any smokeless tobacco history on file. She reports that she does not drink alcohol or use illicit drugs.  Family History:  No family history on file. unable to obtain due to patient intubated and sedated.  Creatinine, Ser  Date/Time Value Range Status  03/28/2011  1:50 PM 3.39* 0.50-1.10 (mg/dL) Final  03/23/9145  8:29 AM 3.18* 0.50-1.10 (mg/dL) Final  07/26/2128  8:65 AM 2.93* 0.50-1.10 (mg/dL) Final  09/28/4694 29:52 AM 2.69* 0.50-1.10 (mg/dL) Final  10/25/1322 40:10 PM 2.69* 0.50-1.10 (mg/dL) Final  04/29/2534  6:44 PM 2.49* 0.50-1.10 (mg/dL) Final  0/05/4740  5:95 PM 2.33* 0.50-1.10 (mg/dL) Final  08/24/8754 43:32 AM 2.21* 0.50-1.10 (mg/dL) Final  11/26/1882  1:66 AM 2.19* 0.50-1.10 (mg/dL)  Final  03/27/2011  4:00 AM 2.05* 0.50-1.10 (mg/dL) Final  08/25/7844  9:62 AM 2.00* 0.50-1.10 (mg/dL) Final  11/26/2839 32:44 PM 1.95* 0.50-1.10 (mg/dL) Final  0/03/270  5:36 PM 2.20* 0.50-1.10 (mg/dL) Final  08/24/4032  7:42 PM 2.12* 0.50-1.10 (mg/dL) Final  07/29/5636  7:56 PM 2.31* 0.50-1.10 (mg/dL) Final    Lab 43/32/95 1350 03/28/11 0950 03/28/11 0600 03/28/11 0054 03/27/11 2200 03/27/11 1800 03/27/11 1440  NA 137 138 140 140 140 141 140  K 4.4 4.4 4.2 4.2 4.3 4.3 4.2  CL 112 113* 115* 115* 115* 116* 115*  CO2 17* 17* 16* 17* 18* 18* 18*  GLUCOSE 86  78 81 84 85 84 82  BUN 23 22 22 21 22 21 21   CREATININE 3.39* 3.18* 2.93* 2.69* 2.69* 2.49* 2.33*  ALB -- -- -- -- -- -- --  CALCIUM 6.9* 6.9* 6.9* 7.0* 7.0* 7.0* 6.8*  PHOS -- -- -- -- -- -- --    Lab 03/27/11 2355 03/27/11 1800 03/27/11 1500 03/27/11 0556 03/26/11 2300  WBC -- -- -- 11.8* 14.8*  NEUTROABS -- -- -- -- --  HGB 7.1* 7.5* 7.4* 7.2* --  HCT 20.8* 21.8* 22.1* 21.0* --  MCV -- -- -- 86.8 88.2  PLT -- -- -- 217 253   Liver Function Tests:  Lab 03/26/11 2131 03/26/11 2022  AST 53* 47*  ALT 21 21  ALKPHOS 66 77  BILITOT 0.2* 0.2*  PROT 4.5* 5.4*  ALBUMIN 1.8* 2.0*    Lab 03/26/11 2131  LIPASE 29  AMYLASE 74   No results found for this basename: AMMONIA:3 in the last 168 hours Cardiac Enzymes:  Lab 03/27/11 2200 03/27/11 1440 03/27/11 0800 03/26/11 2300  CKTOTAL 1822* 2257* 2318* 2085*  CKMB 12.1* 13.4* 13.6* 14.5*  CKMBINDEX -- -- -- --  TROPONINI <0.30 <0.30 <0.30 0.35*   Iron Studies: No results found for this basename: IRON,TIBC,TRANSFERRIN,FERRITIN in the last 72 hours  Results for orders placed during the hospital encounter of 03/26/11 (from the past 48 hour(s))  TYPE AND SCREEN     Status: Normal   Collection Time   03/26/11  8:05 PM      Component Value Range Comment   ABO/RH(D) O POS      Antibody Screen NEG      Sample Expiration 03/29/2011     ABO/RH     Status: Normal   Collection Time   03/26/11  8:05 PM      Component Value Range Comment   ABO/RH(D) O POS     CULTURE, BLOOD (ROUTINE X 2)     Status: Normal (Preliminary result)   Collection Time   03/26/11  8:20 PM      Component Value Range Comment   Specimen Description BLOOD ARM RIGHT      Special Requests BOTTLES DRAWN AEROBIC AND ANAEROBIC 10CC      Setup Time [id number]      Culture        Value:        BLOOD CULTURE RECEIVED NO GROWTH TO DATE CULTURE WILL BE HELD FOR 5 DAYS BEFORE ISSUING A FINAL NEGATIVE REPORT   Report Status PENDING     COMPREHENSIVE METABOLIC PANEL      Status: Abnormal   Collection Time   03/26/11  8:22 PM      Component Value Range Comment   Sodium 136  135 - 145 (mEq/L)    Potassium 5.3 (*) 3.5 - 5.1 (mEq/L) HEMOLYSIS AT THIS LEVEL MAY  AFFECT RESULT   Chloride 105  96 - 112 (mEq/L)    CO2 13 (*) 19 - 32 (mEq/L)    Glucose, Bld 208 (*) 70 - 99 (mg/dL)    BUN 19  6 - 23 (mg/dL)    Creatinine, Ser 2.95 (*) 0.50 - 1.10 (mg/dL)    Calcium 8.1 (*) 8.4 - 10.5 (mg/dL)    Total Protein 5.4 (*) 6.0 - 8.3 (g/dL)    Albumin 2.0 (*) 3.5 - 5.2 (g/dL)    AST 47 (*) 0 - 37 (U/L) HEMOLYSIS AT THIS LEVEL MAY AFFECT RESULT   ALT 21  0 - 35 (U/L)    Alkaline Phosphatase 77  39 - 117 (U/L)    Total Bilirubin 0.2 (*) 0.3 - 1.2 (mg/dL)    GFR calc non Af Amer 29 (*) >90 (mL/min)    GFR calc Af Amer 34 (*) >90 (mL/min)   LACTIC ACID, PLASMA     Status: Abnormal   Collection Time   03/26/11  8:23 PM      Component Value Range Comment   Lactic Acid, Venous 9.1 (*) 0.5 - 2.2 (mmol/L)   CULTURE, BLOOD (ROUTINE X 2)     Status: Normal (Preliminary result)   Collection Time   03/26/11  8:27 PM      Component Value Range Comment   Specimen Description BLOOD CENTRAL LINE      Special Requests        Value: BOTTLES DRAWN AEROBIC AND ANAEROBIC AERO 8CC, ANAE 5CC   Setup Time [id number]      Culture        Value:        BLOOD CULTURE RECEIVED NO GROWTH TO DATE CULTURE WILL BE HELD FOR 5 DAYS BEFORE ISSUING A FINAL NEGATIVE REPORT   Report Status PENDING     URINALYSIS, ROUTINE W REFLEX MICROSCOPIC     Status: Abnormal   Collection Time   03/26/11  8:41 PM      Component Value Range Comment   Color, Urine YELLOW  YELLOW     APPearance CLOUDY (*) CLEAR     Specific Gravity, Urine 1.014  1.005 - 1.030     pH 5.5  5.0 - 8.0     Glucose, UA 100 (*) NEGATIVE (mg/dL)    Hgb urine dipstick LARGE (*) NEGATIVE     Bilirubin Urine NEGATIVE  NEGATIVE     Ketones, ur NEGATIVE  NEGATIVE (mg/dL)    Protein, ur >962 (*) NEGATIVE (mg/dL)    Urobilinogen, UA 0.2  0.0 -  1.0 (mg/dL)    Nitrite NEGATIVE  NEGATIVE     Leukocytes, UA NEGATIVE  NEGATIVE    URINE RAPID DRUG SCREEN (HOSP PERFORMED)     Status: Normal   Collection Time   03/26/11  8:41 PM      Component Value Range Comment   Opiates NONE DETECTED  NONE DETECTED     Cocaine NONE DETECTED  NONE DETECTED     Benzodiazepines NONE DETECTED  NONE DETECTED     Amphetamines NONE DETECTED  NONE DETECTED     Tetrahydrocannabinol NONE DETECTED  NONE DETECTED     Barbiturates NONE DETECTED  NONE DETECTED    URINE MICROSCOPIC-ADD ON     Status: Abnormal   Collection Time   03/26/11  8:41 PM      Component Value Range Comment   Squamous Epithelial / LPF MANY (*) RARE     WBC, UA 11-20  <3 (WBC/hpf)  RBC / HPF TOO NUMEROUS TO COUNT  <3 (RBC/hpf)    Bacteria, UA FEW (*) RARE     Casts HYALINE CASTS (*) NEGATIVE     Urine-Other AMORPHOUS URATES/PHOSPHATES     POCT I-STAT 3, BLOOD GAS (G3+)     Status: Abnormal   Collection Time   03/26/11  9:03 PM      Component Value Range Comment   pH, Arterial 7.158 (*) 7.350 - 7.400     pCO2 arterial 50.6 (*) 35.0 - 45.0 (mmHg)    pO2, Arterial 311.0 (*) 80.0 - 100.0 (mmHg)    Bicarbonate 17.9 (*) 20.0 - 24.0 (mEq/L)    TCO2 19  0 - 100 (mmol/L)    O2 Saturation 100.0      Acid-base deficit 10.0 (*) 0.0 - 2.0 (mmol/L)    Collection site RADIAL, ALLEN'S TEST ACCEPTABLE      Drawn by RT      Sample type ARTERIAL      Comment NOTIFIED PHYSICIAN     POCT PREGNANCY, URINE     Status: Normal   Collection Time   03/26/11  9:04 PM      Component Value Range Comment   Preg Test, Ur NEGATIVE     POCT I-STAT TROPONIN I     Status: Normal   Collection Time   03/26/11  9:30 PM      Component Value Range Comment   Troponin i, poc 0.05  0.00 - 0.08 (ng/mL)    Comment 3            PROTIME-INR     Status: Normal   Collection Time   03/26/11  9:31 PM      Component Value Range Comment   Prothrombin Time 14.9  11.6 - 15.2 (seconds)    INR 1.15  0.00 - 1.49    APTT     Status:  Normal   Collection Time   03/26/11  9:31 PM      Component Value Range Comment   aPTT 34  24 - 37 (seconds)   AMYLASE     Status: Normal   Collection Time   03/26/11  9:31 PM      Component Value Range Comment   Amylase 74  0 - 105 (U/L)   LIPASE, BLOOD     Status: Normal   Collection Time   03/26/11  9:31 PM      Component Value Range Comment   Lipase 29  11 - 59 (U/L)   COMPREHENSIVE METABOLIC PANEL     Status: Abnormal   Collection Time   03/26/11  9:31 PM      Component Value Range Comment   Sodium 140  135 - 145 (mEq/L)    Potassium 5.4 (*) 3.5 - 5.1 (mEq/L)    Chloride 115 (*) 96 - 112 (mEq/L)    CO2 20  19 - 32 (mEq/L)    Glucose, Bld 125 (*) 70 - 99 (mg/dL)    BUN 18  6 - 23 (mg/dL)    Creatinine, Ser 4.01 (*) 0.50 - 1.10 (mg/dL)    Calcium 7.0 (*) 8.4 - 10.5 (mg/dL)    Total Protein 4.5 (*) 6.0 - 8.3 (g/dL)    Albumin 1.8 (*) 3.5 - 5.2 (g/dL)    AST 53 (*) 0 - 37 (U/L)    ALT 21  0 - 35 (U/L)    Alkaline Phosphatase 66  39 - 117 (U/L)    Total Bilirubin 0.2 (*)  0.3 - 1.2 (mg/dL)    GFR calc non Af Amer 32 (*) >90 (mL/min)    GFR calc Af Amer 37 (*) >90 (mL/min)   POCT I-STAT, CHEM 8     Status: Abnormal   Collection Time   03/26/11  9:32 PM      Component Value Range Comment   Sodium 140  135 - 145 (mEq/L)    Potassium 4.9  3.5 - 5.1 (mEq/L)    Chloride 113 (*) 96 - 112 (mEq/L)    BUN 23  6 - 23 (mg/dL)    Creatinine, Ser 2.13 (*) 0.50 - 1.10 (mg/dL)    Glucose, Bld 086 (*) 70 - 99 (mg/dL)    Calcium, Ion 5.78 (*) 1.12 - 1.32 (mmol/L)    TCO2 15  0 - 100 (mmol/L)    Hemoglobin 8.8 (*) 12.0 - 15.0 (g/dL)    HCT 46.9 (*) 62.9 - 46.0 (%)   PROCALCITONIN     Status: Normal   Collection Time   03/26/11  9:40 PM      Component Value Range Comment   Procalcitonin <0.10     CORTISOL     Status: Normal   Collection Time   03/26/11  9:40 PM      Component Value Range Comment   Cortisol, Plasma 25.6     PRO B NATRIURETIC PEPTIDE     Status: Abnormal   Collection Time    03/26/11  9:40 PM      Component Value Range Comment   Pro B Natriuretic peptide (BNP) 11557.0 (*) 0 - 125 (pg/mL)   BASIC METABOLIC PANEL     Status: Abnormal   Collection Time   03/26/11 11:00 PM      Component Value Range Comment   Sodium 139  135 - 145 (mEq/L)    Potassium 5.1  3.5 - 5.1 (mEq/L)    Chloride 113 (*) 96 - 112 (mEq/L)    CO2 18 (*) 19 - 32 (mEq/L)    Glucose, Bld 130 (*) 70 - 99 (mg/dL)    BUN 18  6 - 23 (mg/dL)    Creatinine, Ser 5.28 (*) 0.50 - 1.10 (mg/dL)    Calcium 7.1 (*) 8.4 - 10.5 (mg/dL)    GFR calc non Af Amer 36 (*) >90 (mL/min)    GFR calc Af Amer 41 (*) >90 (mL/min)   CARDIAC PANEL(CRET KIN+CKTOT+MB+TROPI)     Status: Abnormal   Collection Time   03/26/11 11:00 PM      Component Value Range Comment   Total CK 2085 (*) 7 - 177 (U/L)    CK, MB 14.5 (*) 0.3 - 4.0 (ng/mL)    Troponin I 0.35 (*) <0.30 (ng/mL)    Relative Index 0.7  0.0 - 2.5    LIPID PANEL     Status: Abnormal   Collection Time   03/26/11 11:00 PM      Component Value Range Comment   Cholesterol 296 (*) 0 - 200 (mg/dL)    Triglycerides 413  <150 (mg/dL)    HDL 54  >24 (mg/dL)    Total CHOL/HDL Ratio 5.5      VLDL 29  0 - 40 (mg/dL)    LDL Cholesterol 401 (*) 0 - 99 (mg/dL)   CBC     Status: Abnormal   Collection Time   03/26/11 11:00 PM      Component Value Range Comment   WBC 14.8 (*) 4.0 - 10.5 (K/uL)    RBC  2.71 (*) 3.87 - 5.11 (MIL/uL)    Hemoglobin 8.1 (*) 12.0 - 15.0 (g/dL)    HCT 40.9 (*) 81.1 - 46.0 (%)    MCV 88.2  78.0 - 100.0 (fL)    MCH 29.9  26.0 - 34.0 (pg)    MCHC 33.9  30.0 - 36.0 (g/dL)    RDW 91.4  78.2 - 95.6 (%)    Platelets 253  150 - 400 (K/uL)   GLUCOSE, CAPILLARY     Status: Abnormal   Collection Time   03/26/11 11:13 PM      Component Value Range Comment   Glucose-Capillary 104 (*) 70 - 99 (mg/dL)   MRSA PCR SCREENING     Status: Normal   Collection Time   03/26/11 11:33 PM      Component Value Range Comment   MRSA by PCR NEGATIVE  NEGATIVE    URINALYSIS,  ROUTINE W REFLEX MICROSCOPIC     Status: Abnormal   Collection Time   03/26/11 11:49 PM      Component Value Range Comment   Color, Urine YELLOW  YELLOW     APPearance CLEAR  CLEAR     Specific Gravity, Urine 1.012  1.005 - 1.030     pH 5.5  5.0 - 8.0     Glucose, UA NEGATIVE  NEGATIVE (mg/dL)    Hgb urine dipstick LARGE (*) NEGATIVE     Bilirubin Urine NEGATIVE  NEGATIVE     Ketones, ur NEGATIVE  NEGATIVE (mg/dL)    Protein, ur >213 (*) NEGATIVE (mg/dL)    Urobilinogen, UA 0.2  0.0 - 1.0 (mg/dL)    Nitrite NEGATIVE  NEGATIVE     Leukocytes, UA NEGATIVE  NEGATIVE    URINE MICROSCOPIC-ADD ON     Status: Normal   Collection Time   03/26/11 11:49 PM      Component Value Range Comment   Squamous Epithelial / LPF RARE  RARE     WBC, UA 0-2  <3 (WBC/hpf)    RBC / HPF 7-10  <3 (RBC/hpf)    Bacteria, UA RARE  RARE    INFLUENZA PANEL BY PCR     Status: Normal   Collection Time   03/26/11 11:50 PM      Component Value Range Comment   Influenza A By PCR NEGATIVE  NEGATIVE     Influenza B By PCR NEGATIVE  NEGATIVE     H1N1 flu by pcr NOT DETECTED  NOT DETECTED    STREP PNEUMONIAE URINARY ANTIGEN     Status: Normal   Collection Time   03/26/11 11:52 PM      Component Value Range Comment   Strep Pneumo Urinary Antigen NEGATIVE  NEGATIVE    LEGIONELLA ANTIGEN, URINE     Status: Normal   Collection Time   03/26/11 11:52 PM      Component Value Range Comment   Specimen Description URINE, CATHETERIZED      Special Requests NONE      Legionella Antigen, Urine Negative for Legionella pneumophilia serogroup 1      Report Status 03/27/2011 FINAL     SODIUM, URINE, RANDOM     Status: Normal   Collection Time   03/26/11 11:52 PM      Component Value Range Comment   Sodium, Ur 116     CREATININE, URINE, RANDOM     Status: Normal   Collection Time   03/26/11 11:52 PM      Component Value Range Comment  Creatinine, Urine 50.14     CARBOXYHEMOGLOBIN     Status: Abnormal   Collection Time   03/27/11  12:00 AM      Component Value Range Comment   Total hemoglobin 8.6 (*) 12.5 - 16.0 (g/dL)    O2 Saturation 40.9      Carboxyhemoglobin 0.8  0.5 - 1.5 (%)    Methemoglobin 0.5  0.0 - 1.5 (%)   GLUCOSE, CAPILLARY     Status: Abnormal   Collection Time   03/27/11 12:00 AM      Component Value Range Comment   Glucose-Capillary 116 (*) 70 - 99 (mg/dL)   GLUCOSE, CAPILLARY     Status: Abnormal   Collection Time   03/27/11  1:06 AM      Component Value Range Comment   Glucose-Capillary 107 (*) 70 - 99 (mg/dL)   CULTURE, RESPIRATORY     Status: Normal (Preliminary result)   Collection Time   03/27/11  1:11 AM      Component Value Range Comment   Specimen Description ENDOTRACHEAL ASPIRATE      Special Requests NONE      Gram Stain        Value: FEW WBC PRESENT, PREDOMINANTLY PMN     FEW SQUAMOUS EPITHELIAL CELLS PRESENT     RARE GRAM POSITIVE COCCI     IN PAIRS   Culture NORMAL OROPHARYNGEAL FLORA      Report Status PENDING     BASIC METABOLIC PANEL     Status: Abnormal   Collection Time   03/27/11  2:00 AM      Component Value Range Comment   Sodium 137  135 - 145 (mEq/L)    Potassium 5.0  3.5 - 5.1 (mEq/L)    Chloride 112  96 - 112 (mEq/L)    CO2 17 (*) 19 - 32 (mEq/L)    Glucose, Bld 121 (*) 70 - 99 (mg/dL)    BUN 19  6 - 23 (mg/dL)    Creatinine, Ser 8.11 (*) 0.50 - 1.10 (mg/dL)    Calcium 7.0 (*) 8.4 - 10.5 (mg/dL)    GFR calc non Af Amer 35 (*) >90 (mL/min)    GFR calc Af Amer 40 (*) >90 (mL/min)   GLUCOSE, CAPILLARY     Status: Abnormal   Collection Time   03/27/11  2:02 AM      Component Value Range Comment   Glucose-Capillary 109 (*) 70 - 99 (mg/dL)   DRUGS OF ABUSE SCREEN W/O ALC, ROUTINE URINE     Status: Normal   Collection Time   03/27/11  3:00 AM      Component Value Range Comment   Marijuana Metabolite NEGATIVE  Negative     Amphetamine Screen, Ur NEGATIVE  Negative     Barbiturate Quant, Ur NEGATIVE  Negative     Methadone NEGATIVE  Negative     Benzodiazepines.  NEGATIVE  Negative     Phencyclidine (PCP) NEGATIVE  Negative     Cocaine Metabolites NEGATIVE  Negative     Opiate Screen, Urine NEGATIVE  Negative     Propoxyphene NEGATIVE  Negative     Creatinine,U 114.9     GLUCOSE, CAPILLARY     Status: Abnormal   Collection Time   03/27/11  3:11 AM      Component Value Range Comment   Glucose-Capillary 129 (*) 70 - 99 (mg/dL)   BASIC METABOLIC PANEL     Status: Abnormal   Collection Time  03/27/11  4:00 AM      Component Value Range Comment   Sodium 137  135 - 145 (mEq/L)    Potassium 4.8  3.5 - 5.1 (mEq/L)    Chloride 111  96 - 112 (mEq/L)    CO2 18 (*) 19 - 32 (mEq/L)    Glucose, Bld 116 (*) 70 - 99 (mg/dL)    BUN 20  6 - 23 (mg/dL)    Creatinine, Ser 1.19 (*) 0.50 - 1.10 (mg/dL)    Calcium 6.9 (*) 8.4 - 10.5 (mg/dL)    GFR calc non Af Amer 34 (*) >90 (mL/min)    GFR calc Af Amer 39 (*) >90 (mL/min)   GLUCOSE, CAPILLARY     Status: Abnormal   Collection Time   03/27/11  4:05 AM      Component Value Range Comment   Glucose-Capillary 122 (*) 70 - 99 (mg/dL)   CARBOXYHEMOGLOBIN     Status: Abnormal   Collection Time   03/27/11  4:22 AM      Component Value Range Comment   Total hemoglobin 8.8 (*) 12.5 - 16.0 (g/dL)    O2 Saturation 14.7      Carboxyhemoglobin 1.3  0.5 - 1.5 (%)    Methemoglobin 1.0  0.0 - 1.5 (%)   GLUCOSE, CAPILLARY     Status: Abnormal   Collection Time   03/27/11  5:07 AM      Component Value Range Comment   Glucose-Capillary 122 (*) 70 - 99 (mg/dL)   CBC     Status: Abnormal   Collection Time   03/27/11  5:56 AM      Component Value Range Comment   WBC 11.8 (*) 4.0 - 10.5 (K/uL)    RBC 2.42 (*) 3.87 - 5.11 (MIL/uL)    Hemoglobin 7.2 (*) 12.0 - 15.0 (g/dL)    HCT 82.9 (*) 56.2 - 46.0 (%)    MCV 86.8  78.0 - 100.0 (fL)    MCH 29.8  26.0 - 34.0 (pg)    MCHC 34.3  30.0 - 36.0 (g/dL)    RDW 13.0  86.5 - 78.4 (%)    Platelets 217  150 - 400 (K/uL)   BASIC METABOLIC PANEL     Status: Abnormal   Collection Time    03/27/11  5:56 AM      Component Value Range Comment   Sodium 138  135 - 145 (mEq/L)    Potassium 4.8  3.5 - 5.1 (mEq/L)    Chloride 113 (*) 96 - 112 (mEq/L)    CO2 18 (*) 19 - 32 (mEq/L)    Glucose, Bld 124 (*) 70 - 99 (mg/dL)    BUN 20  6 - 23 (mg/dL)    Creatinine, Ser 6.96 (*) 0.50 - 1.10 (mg/dL)    Calcium 7.1 (*) 8.4 - 10.5 (mg/dL)    GFR calc non Af Amer 31 (*) >90 (mL/min)    GFR calc Af Amer 36 (*) >90 (mL/min)   GLUCOSE, CAPILLARY     Status: Abnormal   Collection Time   03/27/11  5:57 AM      Component Value Range Comment   Glucose-Capillary 106 (*) 70 - 99 (mg/dL)   PROTIME-INR     Status: Abnormal   Collection Time   03/27/11  6:20 AM      Component Value Range Comment   Prothrombin Time 15.6 (*) 11.6 - 15.2 (seconds)    INR 1.21  0.00 - 1.49    APTT  Status: Normal   Collection Time   03/27/11  6:20 AM      Component Value Range Comment   aPTT 37  24 - 37 (seconds)   CARDIAC PANEL(CRET KIN+CKTOT+MB+TROPI)     Status: Abnormal   Collection Time   03/27/11  8:00 AM      Component Value Range Comment   Total CK 2318 (*) 7 - 177 (U/L)    CK, MB 13.6 (*) 0.3 - 4.0 (ng/mL) CRITICAL VALUE NOTED.  VALUE IS CONSISTENT WITH PREVIOUSLY REPORTED AND CALLED VALUE.   Troponin I <0.30  <0.30 (ng/mL)    Relative Index 0.6  0.0 - 2.5    CARBOXYHEMOGLOBIN     Status: Abnormal   Collection Time   03/27/11  8:25 AM      Component Value Range Comment   Total hemoglobin 7.1 (*) 12.5 - 16.0 (g/dL)    O2 Saturation 01.0      Carboxyhemoglobin 1.1  0.5 - 1.5 (%)    Methemoglobin 0.9  0.0 - 1.5 (%)   GLUCOSE, CAPILLARY     Status: Abnormal   Collection Time   03/27/11  8:41 AM      Component Value Range Comment   Glucose-Capillary 113 (*) 70 - 99 (mg/dL)   BASIC METABOLIC PANEL     Status: Abnormal   Collection Time   03/27/11 10:00 AM      Component Value Range Comment   Sodium 141  135 - 145 (mEq/L)    Potassium 4.3  3.5 - 5.1 (mEq/L)    Chloride 115 (*) 96 - 112 (mEq/L)    CO2 20   19 - 32 (mEq/L)    Glucose, Bld 104 (*) 70 - 99 (mg/dL)    BUN 20  6 - 23 (mg/dL)    Creatinine, Ser 2.72 (*) 0.50 - 1.10 (mg/dL)    Calcium 6.9 (*) 8.4 - 10.5 (mg/dL)    GFR calc non Af Amer 31 (*) >90 (mL/min)    GFR calc Af Amer 36 (*) >90 (mL/min)   GLUCOSE, CAPILLARY     Status: Normal   Collection Time   03/27/11 10:07 AM      Component Value Range Comment   Glucose-Capillary 90  70 - 99 (mg/dL)   GLUCOSE, CAPILLARY     Status: Normal   Collection Time   03/27/11 12:01 PM      Component Value Range Comment   Glucose-Capillary 93  70 - 99 (mg/dL)   HIV ANTIBODY (ROUTINE TESTING)     Status: Normal   Collection Time   03/27/11 12:20 PM      Component Value Range Comment   HIV NON REACTIVE  NON REACTIVE    GLUCOSE, CAPILLARY     Status: Normal   Collection Time   03/27/11  2:39 PM      Component Value Range Comment   Glucose-Capillary 79  70 - 99 (mg/dL)   BASIC METABOLIC PANEL     Status: Abnormal   Collection Time   03/27/11  2:40 PM      Component Value Range Comment   Sodium 140  135 - 145 (mEq/L)    Potassium 4.2  3.5 - 5.1 (mEq/L)    Chloride 115 (*) 96 - 112 (mEq/L)    CO2 18 (*) 19 - 32 (mEq/L)    Glucose, Bld 82  70 - 99 (mg/dL)    BUN 21  6 - 23 (mg/dL)    Creatinine, Ser 5.36 (*) 0.50 -  1.10 (mg/dL)    Calcium 6.8 (*) 8.4 - 10.5 (mg/dL)    GFR calc non Af Amer 29 (*) >90 (mL/min)    GFR calc Af Amer 33 (*) >90 (mL/min)   CARDIAC PANEL(CRET KIN+CKTOT+MB+TROPI)     Status: Abnormal   Collection Time   03/27/11  2:40 PM      Component Value Range Comment   Total CK 2257 (*) 7 - 177 (U/L)    CK, MB 13.4 (*) 0.3 - 4.0 (ng/mL) CRITICAL VALUE NOTED.  VALUE IS CONSISTENT WITH PREVIOUSLY REPORTED AND CALLED VALUE.   Troponin I <0.30  <0.30 (ng/mL)    Relative Index 0.6  0.0 - 2.5    HEMOGLOBIN AND HEMATOCRIT, BLOOD     Status: Abnormal   Collection Time   03/27/11  3:00 PM      Component Value Range Comment   Hemoglobin 7.4 (*) 12.0 - 15.0 (g/dL)    HCT 91.4 (*) 78.2 -  46.0 (%)   GLUCOSE, CAPILLARY     Status: Normal   Collection Time   03/27/11  3:56 PM      Component Value Range Comment   Glucose-Capillary 77  70 - 99 (mg/dL)   HEMOGLOBIN AND HEMATOCRIT, BLOOD     Status: Abnormal   Collection Time   03/27/11  6:00 PM      Component Value Range Comment   Hemoglobin 7.5 (*) 12.0 - 15.0 (g/dL)    HCT 95.6 (*) 21.3 - 46.0 (%)   BASIC METABOLIC PANEL     Status: Abnormal   Collection Time   03/27/11  6:00 PM      Component Value Range Comment   Sodium 141  135 - 145 (mEq/L)    Potassium 4.3  3.5 - 5.1 (mEq/L)    Chloride 116 (*) 96 - 112 (mEq/L)    CO2 18 (*) 19 - 32 (mEq/L)    Glucose, Bld 84  70 - 99 (mg/dL)    BUN 21  6 - 23 (mg/dL)    Creatinine, Ser 0.86 (*) 0.50 - 1.10 (mg/dL)    Calcium 7.0 (*) 8.4 - 10.5 (mg/dL)    GFR calc non Af Amer 27 (*) >90 (mL/min)    GFR calc Af Amer 31 (*) >90 (mL/min)   GLUCOSE, CAPILLARY     Status: Normal   Collection Time   03/27/11  6:18 PM      Component Value Range Comment   Glucose-Capillary 70  70 - 99 (mg/dL)   GLUCOSE, CAPILLARY     Status: Normal   Collection Time   03/27/11  7:52 PM      Component Value Range Comment   Glucose-Capillary 91  70 - 99 (mg/dL)   GLUCOSE, CAPILLARY     Status: Normal   Collection Time   03/27/11  9:56 PM      Component Value Range Comment   Glucose-Capillary 83  70 - 99 (mg/dL)   BASIC METABOLIC PANEL     Status: Abnormal   Collection Time   03/27/11 10:00 PM      Component Value Range Comment   Sodium 140  135 - 145 (mEq/L)    Potassium 4.3  3.5 - 5.1 (mEq/L)    Chloride 115 (*) 96 - 112 (mEq/L)    CO2 18 (*) 19 - 32 (mEq/L)    Glucose, Bld 85  70 - 99 (mg/dL)    BUN 22  6 - 23 (mg/dL)    Creatinine, Ser 5.78 (*)  0.50 - 1.10 (mg/dL)    Calcium 7.0 (*) 8.4 - 10.5 (mg/dL)    GFR calc non Af Amer 24 (*) >90 (mL/min)    GFR calc Af Amer 28 (*) >90 (mL/min)   CARDIAC PANEL(CRET KIN+CKTOT+MB+TROPI)     Status: Abnormal   Collection Time   03/27/11 10:00 PM      Component  Value Range Comment   Total CK 1822 (*) 7 - 177 (U/L)    CK, MB 12.1 (*) 0.3 - 4.0 (ng/mL) CRITICAL VALUE NOTED.  VALUE IS CONSISTENT WITH PREVIOUSLY REPORTED AND CALLED VALUE.   Troponin I <0.30  <0.30 (ng/mL)    Relative Index 0.7  0.0 - 2.5    GLUCOSE, CAPILLARY     Status: Normal   Collection Time   03/27/11 11:48 PM      Component Value Range Comment   Glucose-Capillary 77  70 - 99 (mg/dL)   HEMOGLOBIN AND HEMATOCRIT, BLOOD     Status: Abnormal   Collection Time   03/27/11 11:55 PM      Component Value Range Comment   Hemoglobin 7.1 (*) 12.0 - 15.0 (g/dL)    HCT 47.8 (*) 29.5 - 46.0 (%)   BASIC METABOLIC PANEL     Status: Abnormal   Collection Time   03/28/11 12:54 AM      Component Value Range Comment   Sodium 140  135 - 145 (mEq/L)    Potassium 4.2  3.5 - 5.1 (mEq/L)    Chloride 115 (*) 96 - 112 (mEq/L)    CO2 17 (*) 19 - 32 (mEq/L)    Glucose, Bld 84  70 - 99 (mg/dL)    BUN 21  6 - 23 (mg/dL)    Creatinine, Ser 6.21 (*) 0.50 - 1.10 (mg/dL)    Calcium 7.0 (*) 8.4 - 10.5 (mg/dL)    GFR calc non Af Amer 24 (*) >90 (mL/min)    GFR calc Af Amer 28 (*) >90 (mL/min)   LACTIC ACID, PLASMA     Status: Normal   Collection Time   03/28/11 12:54 AM      Component Value Range Comment   Lactic Acid, Venous 0.7  0.5 - 2.2 (mmol/L)   GLUCOSE, CAPILLARY     Status: Normal   Collection Time   03/28/11  4:10 AM      Component Value Range Comment   Glucose-Capillary 78  70 - 99 (mg/dL)   LACTIC ACID, PLASMA     Status: Normal   Collection Time   03/28/11  5:00 AM      Component Value Range Comment   Lactic Acid, Venous 0.7  0.5 - 2.2 (mmol/L)   BASIC METABOLIC PANEL     Status: Abnormal   Collection Time   03/28/11  6:00 AM      Component Value Range Comment   Sodium 140  135 - 145 (mEq/L)    Potassium 4.2  3.5 - 5.1 (mEq/L)    Chloride 115 (*) 96 - 112 (mEq/L)    CO2 16 (*) 19 - 32 (mEq/L)    Glucose, Bld 81  70 - 99 (mg/dL)    BUN 22  6 - 23 (mg/dL)    Creatinine, Ser 3.08 (*) 0.50 -  1.10 (mg/dL)    Calcium 6.9 (*) 8.4 - 10.5 (mg/dL)    GFR calc non Af Amer 22 (*) >90 (mL/min)    GFR calc Af Amer 25 (*) >90 (mL/min)   GLUCOSE, CAPILLARY  Status: Normal   Collection Time   03/28/11  7:44 AM      Component Value Range Comment   Glucose-Capillary 71  70 - 99 (mg/dL)   LACTIC ACID, PLASMA     Status: Normal   Collection Time   03/28/11  9:00 AM      Component Value Range Comment   Lactic Acid, Venous 0.6  0.5 - 2.2 (mmol/L)   BASIC METABOLIC PANEL     Status: Abnormal   Collection Time   03/28/11  9:50 AM      Component Value Range Comment   Sodium 138  135 - 145 (mEq/L)    Potassium 4.4  3.5 - 5.1 (mEq/L)    Chloride 113 (*) 96 - 112 (mEq/L)    CO2 17 (*) 19 - 32 (mEq/L)    Glucose, Bld 78  70 - 99 (mg/dL)    BUN 22  6 - 23 (mg/dL)    Creatinine, Ser 1.61 (*) 0.50 - 1.10 (mg/dL)    Calcium 6.9 (*) 8.4 - 10.5 (mg/dL)    GFR calc non Af Amer 20 (*) >90 (mL/min)    GFR calc Af Amer 23 (*) >90 (mL/min)   GLUCOSE, CAPILLARY     Status: Abnormal   Collection Time   03/28/11 10:27 AM      Component Value Range Comment   Glucose-Capillary 69 (*) 70 - 99 (mg/dL)   GLUCOSE, CAPILLARY     Status: Abnormal   Collection Time   03/28/11 11:13 AM      Component Value Range Comment   Glucose-Capillary 104 (*) 70 - 99 (mg/dL)   BASIC METABOLIC PANEL     Status: Abnormal   Collection Time   03/28/11  1:50 PM      Component Value Range Comment   Sodium 137  135 - 145 (mEq/L)    Potassium 4.4  3.5 - 5.1 (mEq/L)    Chloride 112  96 - 112 (mEq/L)    CO2 17 (*) 19 - 32 (mEq/L)    Glucose, Bld 86  70 - 99 (mg/dL)    BUN 23  6 - 23 (mg/dL)    Creatinine, Ser 0.96 (*) 0.50 - 1.10 (mg/dL)    Calcium 6.9 (*) 8.4 - 10.5 (mg/dL)    GFR calc non Af Amer 18 (*) >90 (mL/min)    GFR calc Af Amer 21 (*) >90 (mL/min)   POCT I-STAT 3, BLOOD GAS (G3+)     Status: Abnormal   Collection Time   03/28/11  3:54 PM      Component Value Range Comment   pH, Arterial 7.409 (*) 7.350 - 7.400     pCO2  arterial 24.7 (*) 35.0 - 45.0 (mmHg)    pO2, Arterial 106.0 (*) 80.0 - 100.0 (mmHg)    Bicarbonate 15.6 (*) 20.0 - 24.0 (mEq/L)    TCO2 16  0 - 100 (mmol/L)    O2 Saturation 98.0      Acid-base deficit 8.0 (*) 0.0 - 2.0 (mmol/L)    Patient temperature 98.6 F      Collection site RADIAL, ALLEN'S TEST ACCEPTABLE      Sample type ARTERIAL     GLUCOSE, CAPILLARY     Status: Normal   Collection Time   03/28/11  4:05 PM      Component Value Range Comment   Glucose-Capillary 91  70 - 99 (mg/dL)     Ct Head Wo Contrast  03/28/2011  *RADIOLOGY REPORT*  Clinical Data:  Altered mental status.  Seizures.  Recent cardiopulmonary resuscitation.  CT HEAD WITHOUT CONTRAST  Technique:  Contiguous axial images were obtained from the base of the skull through the vertex without contrast.  Comparison: 03/26/2011.  Findings: The cerebral hemispheres and posterior fossa structures continue to have normal appearances.  Hemorrhage, mass lesion or CT evidence of acute infarction.  The ventricles remain normal in size and position.  Interval mucosal thickening and probable retained secretions in the posterior sphenoid sinus on the left.  IMPRESSION:  Interval left sphenoid sinusitis.  Otherwise, unremarkable examination.  Original Report Authenticated By: Darrol Angel, M.D.   Ct Head Wo Contrast  03/26/2011  *RADIOLOGY REPORT*  Clinical Data: Sudden cardiac arrest of unknown etiology.  CT HEAD WITHOUT CONTRAST  Technique:  Contiguous axial images were obtained from the base of the skull through the vertex without contrast.  Comparison: None.  Findings: The ventricles and sulci are symmetrical.  No mass effect or midline shift.  No abnormal extra-axial fluid collections. Ventricles are not dilated.  Gray-white matter junctions are distinct.  Basal cisterns are not effaced.  No evidence of acute intracranial hemorrhage.  No depressed skull fractures.  Paranasal sinuses are not opacified.  IMPRESSION: No evidence of acute  intracranial hemorrhage, mass lesion, or acute infarct.  Original Report Authenticated By: Marlon Pel, M.D.   Dg Chest Port 1 View  03/27/2011  *RADIOLOGY REPORT*  Clinical Data: Right IJ central line, check endotracheal tube position  PORTABLE CHEST - 1 VIEW  Comparison: 03/26/2011  Findings: Cardiomegaly again noted.  Endotracheal tube unchanged in position with tip 1.7 cm above the carina.  NG tube in place. Stable right IJ central line position with tip in distal SVC. Diffuse bilateral airspace disease again noted with slight improvement in aeration of the upper lobes.  Probable bilateral small pleural effusion.  IMPRESSION: Endotracheal tube unchanged in position with tip 1.7 cm above the carina.  NG tube in place.  Stable right IJ central line position with tip in distal SVC.  Diffuse bilateral airspace disease again noted with slight improvement in aeration of the upper lobes. Probable bilateral small pleural effusion.  Original Report Authenticated By: Natasha Mead, M.D.   Dg Chest Port 1 View  03/26/2011  *RADIOLOGY REPORT*  Clinical Data: Intubated.  PORTABLE CHEST - 1 VIEW  Comparison: None.  Findings: Endotracheal tube is 1.6 cm above the carina.  There is cardiomegaly.  Diffuse bilateral airspace disease.  Probable small right effusion.  No acute bony abnormality.  NG tube enters the stomach.  IMPRESSION: Endotracheal tube 1.6 cm above the carina.  Cardiomegaly with diffuse bilateral airspace disease, edema versus infection.  Small right effusion.  Original Report Authenticated By: Cyndie Chime, M.D.   Dg Chest Port 1v Same Day  03/26/2011  *RADIOLOGY REPORT*  Clinical Data: Central line placement.  PORTABLE CHEST - 1 VIEW SAME DAY  Comparison: 03/26/2011  Findings: Right central line has been placed with the tip in the SVC.  No pneumothorax.  Endotracheal tube remains less than 2 cm above the carina.  Diffuse bilateral airspace disease again noted with cardiomegaly and small right  effusion.  No real change in the appearance of the lungs.  IMPRESSION: Right central line tip in the SVC.  No pneumothorax.  Otherwise no change.  Original Report Authenticated By: Cyndie Chime, M.D.     Blood pressure 76/49, pulse 89, temperature 98.6 F (37 C), temperature source Core (Comment), resp. rate 24, height 5\' 2"  (  1.575 m), weight 155 lb 3.3 oz (70.4 kg), last menstrual period 03/02/2011, SpO2 98.00%. General appearance: intubated and sedated Neck: no adenopathy, no JVD and supple, symmetrical, trachea midline Resp: course mechanically ventiated breath sounds Cardio: RRR, soft systolic murmur GI: soft, non-tender; bowel sounds normal; no masses,  no organomegaly Extremities: extremities normal, atraumatic, no cyanosis or edema Pulses: 2+ and symmetric Neurologic: Mental status: Sedated, not currenlty with tonic-clonic movements.   Assessment/Plan:  21 year old female with cardiac arrest due to respiratory arrest, possible PNA/ARDS, vs. Vasculitis, who has significant cardiomyopathy and HTN, with acute, worsening renal failure:  1) Renal- Acute Renal Failure- this may be due to ATN due to hypotension.  However, she did have a large amount of protein in her urine and considering other disease (heart and lung), patient may have a systemic vasculitis, post-strep glomerulonephritis causing renal failure.  She will need work up for autoimmune disease. At this time no acute indication for HD. Her CVP is 7, she may need more volume, she may benefit from bicarb ggt. 2) CV- cards following,  Levophed per CCM.  TEE per Cards.   3) Neuro- Seizure activity- concerning for anoxic brain injury, now on dilantin and ativan as needed 4) Pulm-  Antibiotics, ARDS protocol and vent settings per CCM 5) Heme- hemoglobin dropping, no evidence of acute bleed, may be due to critical illness 6) Disposition- per CCM  Yosiah Jasmin 03/28/2011, 4:14 PM

## 2011-03-28 NOTE — Progress Notes (Signed)
INITIAL ADULT NUTRITION ASSESSMENT Date: 03/28/2011   Time: 10:59 AM  Reason for Assessment: VDRF  ASSESSMENT: Female 21 y.o.  Dx: Respiratory failure  Hx:  Past Medical History  Diagnosis Date  . Pneumonia last 2 weeks    'walking pneumonia'    Related Meds:     . antiseptic oral rinse  15 mL Mouth Rinse QID  . artificial tears  1 application Both Eyes Q8H  . aspirin  300 mg Rectal NOW  . cefTRIAXone (ROCEPHIN) IVPB 1 gram/50 mL D5W  1 g Intravenous Q24H  . chlorhexidine  15 mL Mouth Rinse BID  . dextrose      . famotidine (PEPCID) IV  20 mg Intravenous QHS  . fentaNYL  25 mcg Intravenous STAT  . fentaNYL  50 mcg Intravenous STAT  . heparin  5,000 Units Subcutaneous Q8H  . influenza  inactive virus vaccine  0.5 mL Intramuscular Tomorrow-1000  . labetalol  20 mg Intravenous STAT  . midazolam  1 mg Intravenous STAT  . midazolam  1 mg Intravenous STAT  . moxifloxacin  400 mg Intravenous Q24H  . sodium chloride  3 mL Intravenous Q12H  . vancomycin  750 mg Intravenous Q24H  . DISCONTD: fentaNYL  25 mcg Intravenous Once  . DISCONTD: fentaNYL  50 mcg Intravenous STAT  . DISCONTD: labetalol  20 mg Intravenous STAT  . DISCONTD: midazolam  1 mg Intravenous Once  . DISCONTD: midazolam  1 mg Intravenous Once  . DISCONTD: vancomycin  1,000 mg Intravenous Q24H    Ht: 5\' 2"  (157.5 cm)  Wt: 155 lb 3.3 oz (70.4 kg)  Ideal Wt: 50 kg % Ideal Wt: 140%  Usual Wt: unable to obtain % Usual Wt: ---  Body mass index is 28.39 kg/(m^2).  Food/Nutrition Related Hx: no nutrition problems per nutrition screen  Labs:  CMP     Component Value Date/Time   NA 140 03/28/2011 0600   K 4.2 03/28/2011 0600   CL 115* 03/28/2011 0600   CO2 16* 03/28/2011 0600   GLUCOSE 81 03/28/2011 0600   BUN 22 03/28/2011 0600   CREATININE 2.93* 03/28/2011 0600   CALCIUM 6.9* 03/28/2011 0600   PROT 4.5* 03/26/2011 2131   ALBUMIN 1.8* 03/26/2011 2131   AST 53* 03/26/2011 2131   ALT 21 03/26/2011 2131   ALKPHOS 66  03/26/2011 2131   BILITOT 0.2* 03/26/2011 2131   GFRNONAA 22* 03/28/2011 0600   GFRAA 25* 03/28/2011 0600    I/O last 3 completed shifts: In: 2468.8 [I.V.:1588.8; NG/GT:30; IV Piggyback:850] Out: 713 [Urine:483; Emesis/NG output:230] Total I/O In: 244.2 [I.V.:214.2; NG/GT:30] Out: 0   Diet Order: NPO  Supplements/Tube Feeding: N/A  IVF:    sodium chloride Last Rate: 10 mL/hr at 03/28/11 0115  cisatracurium (NIMBEX) infusion Last Rate: 1.5 mcg/kg/min (03/28/11 0021)  fentaNYL infusion INTRAVENOUS Last Rate: 120 mcg/hr (03/27/11 1900)  labetalol (NORMODYNE) infusion Last Rate: 20 mg/min (03/27/11 1330)  midazolam (VERSED) infusion Last Rate: 4 mg/hr (03/27/11 2236)  norepinephrine (LEVOPHED) Adult infusion Last Rate: 8 mcg/min (03/28/11 1007)    Estimated Nutritional Needs:   Kcal: 1400-1500 Protein: 85-95 gm protein Fluid: 1.5 L  RD unable to obtain nutrition hx -- pt intubated & sedated; presented to ED after respiratory arrest; started on arctic sun protocol; suspect pt well nourished PTA; OGT in place; recommend initiating nutrition support as medically appropriate -- please see EN regimen recommendations below  NUTRITION DIAGNOSIS: -Inadequate oral intake (NI-2.1).  Status: Ongoing  RELATED TO: inability to eat,  VDRF  AS EVIDENCE BY: NPO status  MONITORING/EVALUATION(Goals): Goal: Initiate nutrition support in next 24-48 hours if extubation not expected to meet >90% of estimated nutrition needs Monitor: Nutrition support initiation, weight, labs I/O's  EDUCATION NEEDS: -Education not appropriate at this time  INTERVENTION:  Initiate EN as medically appropriate (given arctic sun protocol) -- recommend Promote formula -- initiate at 20 ml/hr, advance 10 ml every 4 hours as tolerated until goal rate of 60 ml/hr reached to provide 1440 kcals, 90 gm protein, 1209 ml of free water  RD to follow for nutrition care plan  Dietitian #: [phone]  DOCUMENTATION CODES Per  approved criteria  -Not Applicable    Alger Memos 03/28/2011, 10:59 AM

## 2011-03-28 NOTE — Progress Notes (Signed)
ANTIBIOTIC CONSULT NOTE - FOLLOW UP  Pharmacy Consult for ceftriaxone, vancomycin, moxifloxacin Indication: PNA, r/o sepsis  Assessment: 21 year old female s/p respiratory arrest and PEA on Day 3 of vancomycin/moxifloxacin/ceftriaxone for PNA, r/o sepsis. GPC in respiratory cultures and blood cx pending. Pt in ARF and scr up to 2.93 (crcl ~27).   Goal of Therapy:  Vancomycin trough level 15-20 mcg/ml Eradication of infection   Plan:  1. Decrease vancomycin to 750mg  IV q 24h 2. Continue Moxifloxacin at 400mg  daily (no change) 3. Continue ceftriaxone at 1g daily (no change) 4. Continue to monitor renal function, blood cultures, and pt progression   Thank you,   Brett Fairy, PharmD Pager: [phone]  03/28/2011 9:31 AM   No Known Allergies  Patient Measurements: Height: 5\' 2"  (157.5 cm) Weight: 155 lb 3.3 oz (70.4 kg) IBW/kg (Calculated) : 50.1   Vital Signs: Temp: 95.7 F (35.4 C) (01/05 0900) Temp src: Oral (01/05 0900) BP: 118/77 mmHg (01/05 0900) Pulse Rate: 79  (01/05 0900) Intake/Output from previous day: 01/04 0701 - 01/05 0700 In: 1783.1 [I.V.:1233.1; IV Piggyback:550] Out: 268 [Urine:68; Emesis/NG output:200] Intake/Output from this shift: Total I/O In: 172.8 [I.V.:142.8; NG/GT:30] Out: 0   Labs:  Basename 03/28/11 0600 03/28/11 0054 03/27/11 2355 03/27/11 2200 03/27/11 1800 03/27/11 1500 03/27/11 0556 03/26/11 2352 03/26/11 2300  WBC -- -- -- -- -- -- 11.8* -- 14.8*  HGB -- -- 7.1* -- 7.5* 7.4* -- -- --  PLT -- -- -- -- -- -- 217 -- 253  LABCREA -- -- -- -- -- -- -- 50.14 --  CREATININE 2.93* 2.69* -- 2.69* -- -- -- -- --   Estimated Creatinine Clearance: 27.9 ml/min (by C-G formula based on Cr of 2.93). No results found for this basename: VANCOTROUGH:2,VANCOPEAK:2,VANCORANDOM:2,GENTTROUGH:2,GENTPEAK:2,GENTRANDOM:2,TOBRATROUGH:2,TOBRAPEAK:2,TOBRARND:2,AMIKACINPEAK:2,AMIKACINTROU:2,AMIKACIN:2, in the last 72 hours   Microbiology: Recent Results (from  the past 720 hour(s))  MRSA PCR SCREENING     Status: Normal   Collection Time   03/26/11 11:33 PM      Component Value Range Status Comment   MRSA by PCR NEGATIVE  NEGATIVE  Final   CULTURE, RESPIRATORY     Status: Normal (Preliminary result)   Collection Time   03/27/11  1:11 AM      Component Value Range Status Comment   Specimen Description ENDOTRACHEAL ASPIRATE   Final    Special Requests NONE   Final    Gram Stain     Final    Value: FEW WBC PRESENT, PREDOMINANTLY PMN     FEW SQUAMOUS EPITHELIAL CELLS PRESENT     RARE GRAM POSITIVE COCCI     IN PAIRS   Culture NO GROWTH   Final    Report Status PENDING   Incomplete     Anti-infectives     Start     Dose/Rate Route Frequency Ordered Stop   03/27/11 2000   vancomycin (VANCOCIN) IVPB 1000 mg/200 mL premix        1,000 mg 200 mL/hr over 60 Minutes Intravenous Every 24 hours 03/26/11 2318     03/27/11 0100   cefTRIAXone (ROCEPHIN) 1 g in dextrose 5 % 50 mL IVPB        1 g 100 mL/hr over 30 Minutes Intravenous Every 24 hours 03/26/11 2318     03/26/11 2330   oseltamivir (TAMIFLU) 6 MG/ML suspension 75 mg  Status:  Discontinued        75 mg Oral 2 times daily 03/26/11 2314 03/26/11 2321  03/26/11 2330   moxifloxacin (AVELOX) IVPB 400 mg        400 mg 250 mL/hr over 60 Minutes Intravenous Every 24 hours 03/26/11 2318     03/26/11 2330   oseltamivir (TAMIFLU) 6 MG/ML suspension 75 mg  Status:  Discontinued        75 mg Per Tube 2 times daily 03/26/11 2321 03/27/11 1048   03/26/11 2245   moxifloxacin (AVELOX) IVPB 400 mg  Status:  Discontinued        400 mg 250 mL/hr over 60 Minutes Intravenous Every 24 hours 03/26/11 2243 03/26/11 2319   03/26/11 2245   oseltamivir (TAMIFLU) capsule 75 mg  Status:  Discontinued        75 mg Oral 2 times daily 03/26/11 2243 03/26/11 2313   03/26/11 2200   ceFEPIme (MAXIPIME) 1 g in dextrose 5 % 50 mL IVPB  Status:  Discontinued        1 g 100 mL/hr over 30 Minutes Intravenous Every 12  hours 03/26/11 2030 03/26/11 2243   03/26/11 2045   metroNIDAZOLE (FLAGYL) IVPB 500 mg  Status:  Discontinued        500 mg 100 mL/hr over 60 Minutes Intravenous Every 8 hours 03/26/11 2030 03/26/11 2146   03/26/11 2030   vancomycin (VANCOCIN) IVPB 1000 mg/200 mL premix        1,000 mg 200 mL/hr over 60 Minutes Intravenous  Once 03/26/11 2029 03/26/11 2135          Bufford Buttner, Brenn Deziel N 03/28/2011,9:22 AM

## 2011-03-28 NOTE — Consult Note (Signed)
Reason for Consult:Seizures Referring Physician: Sung Amabile  CC: Seizure activity s/p PEA arrest   HPI: Heather Sims is an 21 y.o. female who had recent complaints of SOB.  Was at a cookout on the day of admission and experienced acute onset of SOB.  EMS was called and after their arrival patient developed PEA arrest.  was noted to arrest. CPR was started in the field and patient regained a pulse without the need for medications,  Was felt to be in PEA for about 5 minutes.  On presentation to the ED was intubated and started on arctic sun protocol.  ut had regained a rhythm.  Today on rewarming the patient was noted to have generalized clonic activity     Past Medical History  Diagnosis Date  . Pneumonia last 2 weeks    'walking pneumonia'    No past surgical history on file.  No family history on file.  Social History:  reports that she has never smoked. She does not have any smokeless tobacco history on file. She reports that she does not drink alcohol or use illicit drugs.  No Known Allergies  Medications:  I have reviewed the patient's current medications. Scheduled:   . antiseptic oral rinse  15 mL Mouth Rinse QID  . artificial tears  1 application Both Eyes Q8H  . aspirin  300 mg Rectal NOW  . cefTRIAXone (ROCEPHIN) IVPB 1 gram/50 mL D5W  1 g Intravenous Q24H  . chlorhexidine  15 mL Mouth Rinse BID  . dextrose      . famotidine (PEPCID) IV  20 mg Intravenous QHS  . fentaNYL  25 mcg Intravenous STAT  . fentaNYL  50 mcg Intravenous STAT  . heparin  5,000 Units Subcutaneous Q8H  . influenza  inactive virus vaccine  0.5 mL Intramuscular Tomorrow-1000  . labetalol  20 mg Intravenous STAT  . LORazepam      . midazolam  1 mg Intravenous STAT  . midazolam  1 mg Intravenous STAT  . moxifloxacin  400 mg Intravenous Q24H  . phenytoin (DILANTIN) IV  1,000 mg Intravenous Once  . phenytoin (DILANTIN) IV  100 mg Intravenous Q8H  . sodium chloride  3 mL Intravenous Q12H  .  vancomycin  750 mg Intravenous Q24H  . DISCONTD: fentaNYL  25 mcg Intravenous Once  . DISCONTD: fentaNYL  50 mcg Intravenous STAT  . DISCONTD: labetalol  20 mg Intravenous STAT  . DISCONTD: midazolam  1 mg Intravenous Once  . DISCONTD: midazolam  1 mg Intravenous Once  . DISCONTD: vancomycin  1,000 mg Intravenous Q24H   Continuous:   . sodium chloride 10 mL/hr at 03/28/11 0115  . labetalol (NORMODYNE) infusion 20 mg/min (03/27/11 1330)  . norepinephrine (LEVOPHED) Adult infusion 12 mcg/min (03/28/11 1128)  . propofol 40 mcg/kg/min (03/28/11 1220)  . DISCONTD: cisatracurium (NIMBEX) infusion Stopped (03/28/11 1102)  . DISCONTD: fentaNYL infusion INTRAVENOUS Stopped (03/28/11 1153)  . DISCONTD: midazolam (VERSED) infusion Stopped (03/28/11 1153)    ROS: Unable to obtain  Blood pressure 106/58, pulse 81, temperature 97.2 F (36.2 C), temperature source Core (Comment), resp. rate 24, height 5\' 2"  (1.575 m), weight 70.4 kg (155 lb 3.3 oz), last menstrual period 03/02/2011, SpO2 100.00%.  Neurologic Examination: Mental Status: Patient does not respond to verbal stimuli.  Does not respond to light sternal rub.  Does not follow commands.  No verbalizations noted.  Cranial Nerves: II: patient does not respond confrontation bilaterally, pupils right 4 mm, left 4 mm,and reactive bilaterally III,IV,VI:  doll's response absent bilaterally.  V,VII: corneal reflex reduced bilaterally  VIII: patient does not respond to verbal stimuli IX,X: gag reflex unable to test, XI: trapezius strength unable to test bilaterally XII: tongue strength unable to test Motor: Extremities flaccid throughout.  Clonic activity noted of the feet bilaterally.  No purposeful movements noted. Sensory: Does not respond to noxious stimuli in any extremity. Deep Tendon Reflexes:  1+ throughout. Plantars: mute bilaterally Cerebellar: Unable to perform   Results for orders placed during the hospital encounter of  03/26/11 (from the past 48 hour(s))  TYPE AND SCREEN     Status: Normal   Collection Time   03/26/11  8:05 PM      Component Value Range Comment   ABO/RH(D) O POS      Antibody Screen NEG      Sample Expiration 03/29/2011     ABO/RH     Status: Normal   Collection Time   03/26/11  8:05 PM      Component Value Range Comment   ABO/RH(D) O POS     CULTURE, BLOOD (ROUTINE X 2)     Status: Normal (Preliminary result)   Collection Time   03/26/11  8:20 PM      Component Value Range Comment   Specimen Description BLOOD ARM RIGHT      Special Requests BOTTLES DRAWN AEROBIC AND ANAEROBIC 10CC      Setup Time [id number]      Culture        Value:        BLOOD CULTURE RECEIVED NO GROWTH TO DATE CULTURE WILL BE HELD FOR 5 DAYS BEFORE ISSUING A FINAL NEGATIVE REPORT   Report Status PENDING     COMPREHENSIVE METABOLIC PANEL     Status: Abnormal   Collection Time   03/26/11  8:22 PM      Component Value Range Comment   Sodium 136  135 - 145 (mEq/L)    Potassium 5.3 (*) 3.5 - 5.1 (mEq/L) HEMOLYSIS AT THIS LEVEL MAY AFFECT RESULT   Chloride 105  96 - 112 (mEq/L)    CO2 13 (*) 19 - 32 (mEq/L)    Glucose, Bld 208 (*) 70 - 99 (mg/dL)    BUN 19  6 - 23 (mg/dL)    Creatinine, Ser 8.11 (*) 0.50 - 1.10 (mg/dL)    Calcium 8.1 (*) 8.4 - 10.5 (mg/dL)    Total Protein 5.4 (*) 6.0 - 8.3 (g/dL)    Albumin 2.0 (*) 3.5 - 5.2 (g/dL)    AST 47 (*) 0 - 37 (U/L) HEMOLYSIS AT THIS LEVEL MAY AFFECT RESULT   ALT 21  0 - 35 (U/L)    Alkaline Phosphatase 77  39 - 117 (U/L)    Total Bilirubin 0.2 (*) 0.3 - 1.2 (mg/dL)    GFR calc non Af Amer 29 (*) >90 (mL/min)    GFR calc Af Amer 34 (*) >90 (mL/min)   LACTIC ACID, PLASMA     Status: Abnormal   Collection Time   03/26/11  8:23 PM      Component Value Range Comment   Lactic Acid, Venous 9.1 (*) 0.5 - 2.2 (mmol/L)   CULTURE, BLOOD (ROUTINE X 2)     Status: Normal (Preliminary result)   Collection Time   03/26/11  8:27 PM      Component Value Range Comment   Specimen  Description BLOOD CENTRAL LINE      Special Requests        Value:  BOTTLES DRAWN AEROBIC AND ANAEROBIC AERO 8CC, ANAE 5CC   Setup Time [id number]      Culture        Value:        BLOOD CULTURE RECEIVED NO GROWTH TO DATE CULTURE WILL BE HELD FOR 5 DAYS BEFORE ISSUING A FINAL NEGATIVE REPORT   Report Status PENDING     URINALYSIS, ROUTINE W REFLEX MICROSCOPIC     Status: Abnormal   Collection Time   03/26/11  8:41 PM      Component Value Range Comment   Color, Urine YELLOW  YELLOW     APPearance CLOUDY (*) CLEAR     Specific Gravity, Urine 1.014  1.005 - 1.030     pH 5.5  5.0 - 8.0     Glucose, UA 100 (*) NEGATIVE (mg/dL)    Hgb urine dipstick LARGE (*) NEGATIVE     Bilirubin Urine NEGATIVE  NEGATIVE     Ketones, ur NEGATIVE  NEGATIVE (mg/dL)    Protein, ur >782 (*) NEGATIVE (mg/dL)    Urobilinogen, UA 0.2  0.0 - 1.0 (mg/dL)    Nitrite NEGATIVE  NEGATIVE     Leukocytes, UA NEGATIVE  NEGATIVE    URINE RAPID DRUG SCREEN (HOSP PERFORMED)     Status: Normal   Collection Time   03/26/11  8:41 PM      Component Value Range Comment   Opiates NONE DETECTED  NONE DETECTED     Cocaine NONE DETECTED  NONE DETECTED     Benzodiazepines NONE DETECTED  NONE DETECTED     Amphetamines NONE DETECTED  NONE DETECTED     Tetrahydrocannabinol NONE DETECTED  NONE DETECTED     Barbiturates NONE DETECTED  NONE DETECTED    URINE MICROSCOPIC-ADD ON     Status: Abnormal   Collection Time   03/26/11  8:41 PM      Component Value Range Comment   Squamous Epithelial / LPF MANY (*) RARE     WBC, UA 11-20  <3 (WBC/hpf)    RBC / HPF TOO NUMEROUS TO COUNT  <3 (RBC/hpf)    Bacteria, UA FEW (*) RARE     Casts HYALINE CASTS (*) NEGATIVE     Urine-Other AMORPHOUS URATES/PHOSPHATES     POCT I-STAT 3, BLOOD GAS (G3+)     Status: Abnormal   Collection Time   03/26/11  9:03 PM      Component Value Range Comment   pH, Arterial 7.158 (*) 7.350 - 7.400     pCO2 arterial 50.6 (*) 35.0 - 45.0 (mmHg)    pO2, Arterial 311.0  (*) 80.0 - 100.0 (mmHg)    Bicarbonate 17.9 (*) 20.0 - 24.0 (mEq/L)    TCO2 19  0 - 100 (mmol/L)    O2 Saturation 100.0      Acid-base deficit 10.0 (*) 0.0 - 2.0 (mmol/L)    Collection site RADIAL, ALLEN'S TEST ACCEPTABLE      Drawn by RT      Sample type ARTERIAL      Comment NOTIFIED PHYSICIAN     POCT PREGNANCY, URINE     Status: Normal   Collection Time   03/26/11  9:04 PM      Component Value Range Comment   Preg Test, Ur NEGATIVE     POCT I-STAT TROPONIN I     Status: Normal   Collection Time   03/26/11  9:30 PM      Component Value Range Comment   Troponin i, poc  0.05  0.00 - 0.08 (ng/mL)    Comment 3            PROTIME-INR     Status: Normal   Collection Time   03/26/11  9:31 PM      Component Value Range Comment   Prothrombin Time 14.9  11.6 - 15.2 (seconds)    INR 1.15  0.00 - 1.49    APTT     Status: Normal   Collection Time   03/26/11  9:31 PM      Component Value Range Comment   aPTT 34  24 - 37 (seconds)   AMYLASE     Status: Normal   Collection Time   03/26/11  9:31 PM      Component Value Range Comment   Amylase 74  0 - 105 (U/L)   LIPASE, BLOOD     Status: Normal   Collection Time   03/26/11  9:31 PM      Component Value Range Comment   Lipase 29  11 - 59 (U/L)   COMPREHENSIVE METABOLIC PANEL     Status: Abnormal   Collection Time   03/26/11  9:31 PM      Component Value Range Comment   Sodium 140  135 - 145 (mEq/L)    Potassium 5.4 (*) 3.5 - 5.1 (mEq/L)    Chloride 115 (*) 96 - 112 (mEq/L)    CO2 20  19 - 32 (mEq/L)    Glucose, Bld 125 (*) 70 - 99 (mg/dL)    BUN 18  6 - 23 (mg/dL)    Creatinine, Ser 1.19 (*) 0.50 - 1.10 (mg/dL)    Calcium 7.0 (*) 8.4 - 10.5 (mg/dL)    Total Protein 4.5 (*) 6.0 - 8.3 (g/dL)    Albumin 1.8 (*) 3.5 - 5.2 (g/dL)    AST 53 (*) 0 - 37 (U/L)    ALT 21  0 - 35 (U/L)    Alkaline Phosphatase 66  39 - 117 (U/L)    Total Bilirubin 0.2 (*) 0.3 - 1.2 (mg/dL)    GFR calc non Af Amer 32 (*) >90 (mL/min)    GFR calc Af Amer 37 (*) >90  (mL/min)   POCT I-STAT, CHEM 8     Status: Abnormal   Collection Time   03/26/11  9:32 PM      Component Value Range Comment   Sodium 140  135 - 145 (mEq/L)    Potassium 4.9  3.5 - 5.1 (mEq/L)    Chloride 113 (*) 96 - 112 (mEq/L)    BUN 23  6 - 23 (mg/dL)    Creatinine, Ser 1.47 (*) 0.50 - 1.10 (mg/dL)    Glucose, Bld 829 (*) 70 - 99 (mg/dL)    Calcium, Ion 5.62 (*) 1.12 - 1.32 (mmol/L)    TCO2 15  0 - 100 (mmol/L)    Hemoglobin 8.8 (*) 12.0 - 15.0 (g/dL)    HCT 13.0 (*) 86.5 - 46.0 (%)   PROCALCITONIN     Status: Normal   Collection Time   03/26/11  9:40 PM      Component Value Range Comment   Procalcitonin <0.10     CORTISOL     Status: Normal   Collection Time   03/26/11  9:40 PM      Component Value Range Comment   Cortisol, Plasma 25.6     PRO B NATRIURETIC PEPTIDE     Status: Abnormal   Collection Time   03/26/11  9:40 PM      Component Value Range Comment   Pro B Natriuretic peptide (BNP) 11557.0 (*) 0 - 125 (pg/mL)   BASIC METABOLIC PANEL     Status: Abnormal   Collection Time   03/26/11 11:00 PM      Component Value Range Comment   Sodium 139  135 - 145 (mEq/L)    Potassium 5.1  3.5 - 5.1 (mEq/L)    Chloride 113 (*) 96 - 112 (mEq/L)    CO2 18 (*) 19 - 32 (mEq/L)    Glucose, Bld 130 (*) 70 - 99 (mg/dL)    BUN 18  6 - 23 (mg/dL)    Creatinine, Ser 1.61 (*) 0.50 - 1.10 (mg/dL)    Calcium 7.1 (*) 8.4 - 10.5 (mg/dL)    GFR calc non Af Amer 36 (*) >90 (mL/min)    GFR calc Af Amer 41 (*) >90 (mL/min)   CARDIAC PANEL(CRET KIN+CKTOT+MB+TROPI)     Status: Abnormal   Collection Time   03/26/11 11:00 PM      Component Value Range Comment   Total CK 2085 (*) 7 - 177 (U/L)    CK, MB 14.5 (*) 0.3 - 4.0 (ng/mL)    Troponin I 0.35 (*) <0.30 (ng/mL)    Relative Index 0.7  0.0 - 2.5    LIPID PANEL     Status: Abnormal   Collection Time   03/26/11 11:00 PM      Component Value Range Comment   Cholesterol 296 (*) 0 - 200 (mg/dL)    Triglycerides 096  <150 (mg/dL)    HDL 54  >04  (mg/dL)    Total CHOL/HDL Ratio 5.5      VLDL 29  0 - 40 (mg/dL)    LDL Cholesterol 540 (*) 0 - 99 (mg/dL)   CBC     Status: Abnormal   Collection Time   03/26/11 11:00 PM      Component Value Range Comment   WBC 14.8 (*) 4.0 - 10.5 (K/uL)    RBC 2.71 (*) 3.87 - 5.11 (MIL/uL)    Hemoglobin 8.1 (*) 12.0 - 15.0 (g/dL)    HCT 98.1 (*) 19.1 - 46.0 (%)    MCV 88.2  78.0 - 100.0 (fL)    MCH 29.9  26.0 - 34.0 (pg)    MCHC 33.9  30.0 - 36.0 (g/dL)    RDW 47.8  29.5 - 62.1 (%)    Platelets 253  150 - 400 (K/uL)   GLUCOSE, CAPILLARY     Status: Abnormal   Collection Time   03/26/11 11:13 PM      Component Value Range Comment   Glucose-Capillary 104 (*) 70 - 99 (mg/dL)   MRSA PCR SCREENING     Status: Normal   Collection Time   03/26/11 11:33 PM      Component Value Range Comment   MRSA by PCR NEGATIVE  NEGATIVE    URINALYSIS, ROUTINE W REFLEX MICROSCOPIC     Status: Abnormal   Collection Time   03/26/11 11:49 PM      Component Value Range Comment   Color, Urine YELLOW  YELLOW     APPearance CLEAR  CLEAR     Specific Gravity, Urine 1.012  1.005 - 1.030     pH 5.5  5.0 - 8.0     Glucose, UA NEGATIVE  NEGATIVE (mg/dL)    Hgb urine dipstick LARGE (*) NEGATIVE     Bilirubin Urine NEGATIVE  NEGATIVE  Ketones, ur NEGATIVE  NEGATIVE (mg/dL)    Protein, ur >960 (*) NEGATIVE (mg/dL)    Urobilinogen, UA 0.2  0.0 - 1.0 (mg/dL)    Nitrite NEGATIVE  NEGATIVE     Leukocytes, UA NEGATIVE  NEGATIVE    URINE MICROSCOPIC-ADD ON     Status: Normal   Collection Time   03/26/11 11:49 PM      Component Value Range Comment   Squamous Epithelial / LPF RARE  RARE     WBC, UA 0-2  <3 (WBC/hpf)    RBC / HPF 7-10  <3 (RBC/hpf)    Bacteria, UA RARE  RARE    INFLUENZA PANEL BY PCR     Status: Normal   Collection Time   03/26/11 11:50 PM      Component Value Range Comment   Influenza A By PCR NEGATIVE  NEGATIVE     Influenza B By PCR NEGATIVE  NEGATIVE     H1N1 flu by pcr NOT DETECTED  NOT DETECTED    STREP  PNEUMONIAE URINARY ANTIGEN     Status: Normal   Collection Time   03/26/11 11:52 PM      Component Value Range Comment   Strep Pneumo Urinary Antigen NEGATIVE  NEGATIVE    LEGIONELLA ANTIGEN, URINE     Status: Normal   Collection Time   03/26/11 11:52 PM      Component Value Range Comment   Specimen Description URINE, CATHETERIZED      Special Requests NONE      Legionella Antigen, Urine Negative for Legionella pneumophilia serogroup 1      Report Status 03/27/2011 FINAL     SODIUM, URINE, RANDOM     Status: Normal   Collection Time   03/26/11 11:52 PM      Component Value Range Comment   Sodium, Ur 116     CREATININE, URINE, RANDOM     Status: Normal   Collection Time   03/26/11 11:52 PM      Component Value Range Comment   Creatinine, Urine 50.14     CARBOXYHEMOGLOBIN     Status: Abnormal   Collection Time   03/27/11 12:00 AM      Component Value Range Comment   Total hemoglobin 8.6 (*) 12.5 - 16.0 (g/dL)    O2 Saturation 45.4      Carboxyhemoglobin 0.8  0.5 - 1.5 (%)    Methemoglobin 0.5  0.0 - 1.5 (%)   GLUCOSE, CAPILLARY     Status: Abnormal   Collection Time   03/27/11 12:00 AM      Component Value Range Comment   Glucose-Capillary 116 (*) 70 - 99 (mg/dL)   GLUCOSE, CAPILLARY     Status: Abnormal   Collection Time   03/27/11  1:06 AM      Component Value Range Comment   Glucose-Capillary 107 (*) 70 - 99 (mg/dL)   CULTURE, RESPIRATORY     Status: Normal (Preliminary result)   Collection Time   03/27/11  1:11 AM      Component Value Range Comment   Specimen Description ENDOTRACHEAL ASPIRATE      Special Requests NONE      Gram Stain        Value: FEW WBC PRESENT, PREDOMINANTLY PMN     FEW SQUAMOUS EPITHELIAL CELLS PRESENT     RARE GRAM POSITIVE COCCI     IN PAIRS   Culture NORMAL OROPHARYNGEAL FLORA      Report Status PENDING  BASIC METABOLIC PANEL     Status: Abnormal   Collection Time   03/27/11  2:00 AM      Component Value Range Comment   Sodium 137  135 - 145  (mEq/L)    Potassium 5.0  3.5 - 5.1 (mEq/L)    Chloride 112  96 - 112 (mEq/L)    CO2 17 (*) 19 - 32 (mEq/L)    Glucose, Bld 121 (*) 70 - 99 (mg/dL)    BUN 19  6 - 23 (mg/dL)    Creatinine, Ser 1.61 (*) 0.50 - 1.10 (mg/dL)    Calcium 7.0 (*) 8.4 - 10.5 (mg/dL)    GFR calc non Af Amer 35 (*) >90 (mL/min)    GFR calc Af Amer 40 (*) >90 (mL/min)   GLUCOSE, CAPILLARY     Status: Abnormal   Collection Time   03/27/11  2:02 AM      Component Value Range Comment   Glucose-Capillary 109 (*) 70 - 99 (mg/dL)   DRUGS OF ABUSE SCREEN W/O ALC, ROUTINE URINE     Status: Normal   Collection Time   03/27/11  3:00 AM      Component Value Range Comment   Marijuana Metabolite NEGATIVE  Negative     Amphetamine Screen, Ur NEGATIVE  Negative     Barbiturate Quant, Ur NEGATIVE  Negative     Methadone NEGATIVE  Negative     Benzodiazepines. NEGATIVE  Negative     Phencyclidine (PCP) NEGATIVE  Negative     Cocaine Metabolites NEGATIVE  Negative     Opiate Screen, Urine NEGATIVE  Negative     Propoxyphene NEGATIVE  Negative     Creatinine,U 114.9     GLUCOSE, CAPILLARY     Status: Abnormal   Collection Time   03/27/11  3:11 AM      Component Value Range Comment   Glucose-Capillary 129 (*) 70 - 99 (mg/dL)   BASIC METABOLIC PANEL     Status: Abnormal   Collection Time   03/27/11  4:00 AM      Component Value Range Comment   Sodium 137  135 - 145 (mEq/L)    Potassium 4.8  3.5 - 5.1 (mEq/L)    Chloride 111  96 - 112 (mEq/L)    CO2 18 (*) 19 - 32 (mEq/L)    Glucose, Bld 116 (*) 70 - 99 (mg/dL)    BUN 20  6 - 23 (mg/dL)    Creatinine, Ser 0.96 (*) 0.50 - 1.10 (mg/dL)    Calcium 6.9 (*) 8.4 - 10.5 (mg/dL)    GFR calc non Af Amer 34 (*) >90 (mL/min)    GFR calc Af Amer 39 (*) >90 (mL/min)   GLUCOSE, CAPILLARY     Status: Abnormal   Collection Time   03/27/11  4:05 AM      Component Value Range Comment   Glucose-Capillary 122 (*) 70 - 99 (mg/dL)   CARBOXYHEMOGLOBIN     Status: Abnormal   Collection Time    03/27/11  4:22 AM      Component Value Range Comment   Total hemoglobin 8.8 (*) 12.5 - 16.0 (g/dL)    O2 Saturation 04.5      Carboxyhemoglobin 1.3  0.5 - 1.5 (%)    Methemoglobin 1.0  0.0 - 1.5 (%)   GLUCOSE, CAPILLARY     Status: Abnormal   Collection Time   03/27/11  5:07 AM      Component Value Range Comment  Glucose-Capillary 122 (*) 70 - 99 (mg/dL)   CBC     Status: Abnormal   Collection Time   03/27/11  5:56 AM      Component Value Range Comment   WBC 11.8 (*) 4.0 - 10.5 (K/uL)    RBC 2.42 (*) 3.87 - 5.11 (MIL/uL)    Hemoglobin 7.2 (*) 12.0 - 15.0 (g/dL)    HCT 54.0 (*) 98.1 - 46.0 (%)    MCV 86.8  78.0 - 100.0 (fL)    MCH 29.8  26.0 - 34.0 (pg)    MCHC 34.3  30.0 - 36.0 (g/dL)    RDW 19.1  47.8 - 29.5 (%)    Platelets 217  150 - 400 (K/uL)   BASIC METABOLIC PANEL     Status: Abnormal   Collection Time   03/27/11  5:56 AM      Component Value Range Comment   Sodium 138  135 - 145 (mEq/L)    Potassium 4.8  3.5 - 5.1 (mEq/L)    Chloride 113 (*) 96 - 112 (mEq/L)    CO2 18 (*) 19 - 32 (mEq/L)    Glucose, Bld 124 (*) 70 - 99 (mg/dL)    BUN 20  6 - 23 (mg/dL)    Creatinine, Ser 6.21 (*) 0.50 - 1.10 (mg/dL)    Calcium 7.1 (*) 8.4 - 10.5 (mg/dL)    GFR calc non Af Amer 31 (*) >90 (mL/min)    GFR calc Af Amer 36 (*) >90 (mL/min)   GLUCOSE, CAPILLARY     Status: Abnormal   Collection Time   03/27/11  5:57 AM      Component Value Range Comment   Glucose-Capillary 106 (*) 70 - 99 (mg/dL)   PROTIME-INR     Status: Abnormal   Collection Time   03/27/11  6:20 AM      Component Value Range Comment   Prothrombin Time 15.6 (*) 11.6 - 15.2 (seconds)    INR 1.21  0.00 - 1.49    APTT     Status: Normal   Collection Time   03/27/11  6:20 AM      Component Value Range Comment   aPTT 37  24 - 37 (seconds)   CARDIAC PANEL(CRET KIN+CKTOT+MB+TROPI)     Status: Abnormal   Collection Time   03/27/11  8:00 AM      Component Value Range Comment   Total CK 2318 (*) 7 - 177 (U/L)    CK, MB 13.6 (*)  0.3 - 4.0 (ng/mL) CRITICAL VALUE NOTED.  VALUE IS CONSISTENT WITH PREVIOUSLY REPORTED AND CALLED VALUE.   Troponin I <0.30  <0.30 (ng/mL)    Relative Index 0.6  0.0 - 2.5    CARBOXYHEMOGLOBIN     Status: Abnormal   Collection Time   03/27/11  8:25 AM      Component Value Range Comment   Total hemoglobin 7.1 (*) 12.5 - 16.0 (g/dL)    O2 Saturation 30.8      Carboxyhemoglobin 1.1  0.5 - 1.5 (%)    Methemoglobin 0.9  0.0 - 1.5 (%)   GLUCOSE, CAPILLARY     Status: Abnormal   Collection Time   03/27/11  8:41 AM      Component Value Range Comment   Glucose-Capillary 113 (*) 70 - 99 (mg/dL)   BASIC METABOLIC PANEL     Status: Abnormal   Collection Time   03/27/11 10:00 AM      Component Value Range Comment  Sodium 141  135 - 145 (mEq/L)    Potassium 4.3  3.5 - 5.1 (mEq/L)    Chloride 115 (*) 96 - 112 (mEq/L)    CO2 20  19 - 32 (mEq/L)    Glucose, Bld 104 (*) 70 - 99 (mg/dL)    BUN 20  6 - 23 (mg/dL)    Creatinine, Ser 1.61 (*) 0.50 - 1.10 (mg/dL)    Calcium 6.9 (*) 8.4 - 10.5 (mg/dL)    GFR calc non Af Amer 31 (*) >90 (mL/min)    GFR calc Af Amer 36 (*) >90 (mL/min)   GLUCOSE, CAPILLARY     Status: Normal   Collection Time   03/27/11 10:07 AM      Component Value Range Comment   Glucose-Capillary 90  70 - 99 (mg/dL)   GLUCOSE, CAPILLARY     Status: Normal   Collection Time   03/27/11 12:01 PM      Component Value Range Comment   Glucose-Capillary 93  70 - 99 (mg/dL)   HIV ANTIBODY (ROUTINE TESTING)     Status: Normal   Collection Time   03/27/11 12:20 PM      Component Value Range Comment   HIV NON REACTIVE  NON REACTIVE    GLUCOSE, CAPILLARY     Status: Normal   Collection Time   03/27/11  2:39 PM      Component Value Range Comment   Glucose-Capillary 79  70 - 99 (mg/dL)   BASIC METABOLIC PANEL     Status: Abnormal   Collection Time   03/27/11  2:40 PM      Component Value Range Comment   Sodium 140  135 - 145 (mEq/L)    Potassium 4.2  3.5 - 5.1 (mEq/L)    Chloride 115 (*) 96 - 112  (mEq/L)    CO2 18 (*) 19 - 32 (mEq/L)    Glucose, Bld 82  70 - 99 (mg/dL)    BUN 21  6 - 23 (mg/dL)    Creatinine, Ser 0.96 (*) 0.50 - 1.10 (mg/dL)    Calcium 6.8 (*) 8.4 - 10.5 (mg/dL)    GFR calc non Af Amer 29 (*) >90 (mL/min)    GFR calc Af Amer 33 (*) >90 (mL/min)   CARDIAC PANEL(CRET KIN+CKTOT+MB+TROPI)     Status: Abnormal   Collection Time   03/27/11  2:40 PM      Component Value Range Comment   Total CK 2257 (*) 7 - 177 (U/L)    CK, MB 13.4 (*) 0.3 - 4.0 (ng/mL) CRITICAL VALUE NOTED.  VALUE IS CONSISTENT WITH PREVIOUSLY REPORTED AND CALLED VALUE.   Troponin I <0.30  <0.30 (ng/mL)    Relative Index 0.6  0.0 - 2.5    HEMOGLOBIN AND HEMATOCRIT, BLOOD     Status: Abnormal   Collection Time   03/27/11  3:00 PM      Component Value Range Comment   Hemoglobin 7.4 (*) 12.0 - 15.0 (g/dL)    HCT 04.5 (*) 40.9 - 46.0 (%)   GLUCOSE, CAPILLARY     Status: Normal   Collection Time   03/27/11  3:56 PM      Component Value Range Comment   Glucose-Capillary 77  70 - 99 (mg/dL)   HEMOGLOBIN AND HEMATOCRIT, BLOOD     Status: Abnormal   Collection Time   03/27/11  6:00 PM      Component Value Range Comment   Hemoglobin 7.5 (*) 12.0 - 15.0 (g/dL)  HCT 21.8 (*) 36.0 - 46.0 (%)   BASIC METABOLIC PANEL     Status: Abnormal   Collection Time   03/27/11  6:00 PM      Component Value Range Comment   Sodium 141  135 - 145 (mEq/L)    Potassium 4.3  3.5 - 5.1 (mEq/L)    Chloride 116 (*) 96 - 112 (mEq/L)    CO2 18 (*) 19 - 32 (mEq/L)    Glucose, Bld 84  70 - 99 (mg/dL)    BUN 21  6 - 23 (mg/dL)    Creatinine, Ser 1.61 (*) 0.50 - 1.10 (mg/dL)    Calcium 7.0 (*) 8.4 - 10.5 (mg/dL)    GFR calc non Af Amer 27 (*) >90 (mL/min)    GFR calc Af Amer 31 (*) >90 (mL/min)   GLUCOSE, CAPILLARY     Status: Normal   Collection Time   03/27/11  6:18 PM      Component Value Range Comment   Glucose-Capillary 70  70 - 99 (mg/dL)   GLUCOSE, CAPILLARY     Status: Normal   Collection Time   03/27/11  7:52 PM       Component Value Range Comment   Glucose-Capillary 91  70 - 99 (mg/dL)   GLUCOSE, CAPILLARY     Status: Normal   Collection Time   03/27/11  9:56 PM      Component Value Range Comment   Glucose-Capillary 83  70 - 99 (mg/dL)   BASIC METABOLIC PANEL     Status: Abnormal   Collection Time   03/27/11 10:00 PM      Component Value Range Comment   Sodium 140  135 - 145 (mEq/L)    Potassium 4.3  3.5 - 5.1 (mEq/L)    Chloride 115 (*) 96 - 112 (mEq/L)    CO2 18 (*) 19 - 32 (mEq/L)    Glucose, Bld 85  70 - 99 (mg/dL)    BUN 22  6 - 23 (mg/dL)    Creatinine, Ser 0.96 (*) 0.50 - 1.10 (mg/dL)    Calcium 7.0 (*) 8.4 - 10.5 (mg/dL)    GFR calc non Af Amer 24 (*) >90 (mL/min)    GFR calc Af Amer 28 (*) >90 (mL/min)   CARDIAC PANEL(CRET KIN+CKTOT+MB+TROPI)     Status: Abnormal   Collection Time   03/27/11 10:00 PM      Component Value Range Comment   Total CK 1822 (*) 7 - 177 (U/L)    CK, MB 12.1 (*) 0.3 - 4.0 (ng/mL) CRITICAL VALUE NOTED.  VALUE IS CONSISTENT WITH PREVIOUSLY REPORTED AND CALLED VALUE.   Troponin I <0.30  <0.30 (ng/mL)    Relative Index 0.7  0.0 - 2.5    GLUCOSE, CAPILLARY     Status: Normal   Collection Time   03/27/11 11:48 PM      Component Value Range Comment   Glucose-Capillary 77  70 - 99 (mg/dL)   HEMOGLOBIN AND HEMATOCRIT, BLOOD     Status: Abnormal   Collection Time   03/27/11 11:55 PM      Component Value Range Comment   Hemoglobin 7.1 (*) 12.0 - 15.0 (g/dL)    HCT 04.5 (*) 40.9 - 46.0 (%)   BASIC METABOLIC PANEL     Status: Abnormal   Collection Time   03/28/11 12:54 AM      Component Value Range Comment   Sodium 140  135 - 145 (mEq/L)    Potassium 4.2  3.5 - 5.1 (mEq/L)    Chloride 115 (*) 96 - 112 (mEq/L)    CO2 17 (*) 19 - 32 (mEq/L)    Glucose, Bld 84  70 - 99 (mg/dL)    BUN 21  6 - 23 (mg/dL)    Creatinine, Ser 2.13 (*) 0.50 - 1.10 (mg/dL)    Calcium 7.0 (*) 8.4 - 10.5 (mg/dL)    GFR calc non Af Amer 24 (*) >90 (mL/min)    GFR calc Af Amer 28 (*) >90 (mL/min)     LACTIC ACID, PLASMA     Status: Normal   Collection Time   03/28/11 12:54 AM      Component Value Range Comment   Lactic Acid, Venous 0.7  0.5 - 2.2 (mmol/L)   GLUCOSE, CAPILLARY     Status: Normal   Collection Time   03/28/11  4:10 AM      Component Value Range Comment   Glucose-Capillary 78  70 - 99 (mg/dL)   LACTIC ACID, PLASMA     Status: Normal   Collection Time   03/28/11  5:00 AM      Component Value Range Comment   Lactic Acid, Venous 0.7  0.5 - 2.2 (mmol/L)   BASIC METABOLIC PANEL     Status: Abnormal   Collection Time   03/28/11  6:00 AM      Component Value Range Comment   Sodium 140  135 - 145 (mEq/L)    Potassium 4.2  3.5 - 5.1 (mEq/L)    Chloride 115 (*) 96 - 112 (mEq/L)    CO2 16 (*) 19 - 32 (mEq/L)    Glucose, Bld 81  70 - 99 (mg/dL)    BUN 22  6 - 23 (mg/dL)    Creatinine, Ser 0.86 (*) 0.50 - 1.10 (mg/dL)    Calcium 6.9 (*) 8.4 - 10.5 (mg/dL)    GFR calc non Af Amer 22 (*) >90 (mL/min)    GFR calc Af Amer 25 (*) >90 (mL/min)   GLUCOSE, CAPILLARY     Status: Normal   Collection Time   03/28/11  7:44 AM      Component Value Range Comment   Glucose-Capillary 71  70 - 99 (mg/dL)   LACTIC ACID, PLASMA     Status: Normal   Collection Time   03/28/11  9:00 AM      Component Value Range Comment   Lactic Acid, Venous 0.6  0.5 - 2.2 (mmol/L)   BASIC METABOLIC PANEL     Status: Abnormal   Collection Time   03/28/11  9:50 AM      Component Value Range Comment   Sodium 138  135 - 145 (mEq/L)    Potassium 4.4  3.5 - 5.1 (mEq/L)    Chloride 113 (*) 96 - 112 (mEq/L)    CO2 17 (*) 19 - 32 (mEq/L)    Glucose, Bld 78  70 - 99 (mg/dL)    BUN 22  6 - 23 (mg/dL)    Creatinine, Ser 5.78 (*) 0.50 - 1.10 (mg/dL)    Calcium 6.9 (*) 8.4 - 10.5 (mg/dL)    GFR calc non Af Amer 20 (*) >90 (mL/min)    GFR calc Af Amer 23 (*) >90 (mL/min)   GLUCOSE, CAPILLARY     Status: Abnormal   Collection Time   03/28/11 10:27 AM      Component Value Range Comment   Glucose-Capillary 69 (*) 70 - 99  (mg/dL)   GLUCOSE, CAPILLARY  Status: Abnormal   Collection Time   03/28/11 11:13 AM      Component Value Range Comment   Glucose-Capillary 104 (*) 70 - 99 (mg/dL)     Ct Head Wo Contrast  03/26/2011  *RADIOLOGY REPORT*  Clinical Data: Sudden cardiac arrest of unknown etiology.  CT HEAD WITHOUT CONTRAST  Technique:  Contiguous axial images were obtained from the base of the skull through the vertex without contrast.  Comparison: None.  Findings: The ventricles and sulci are symmetrical.  No mass effect or midline shift.  No abnormal extra-axial fluid collections. Ventricles are not dilated.  Gray-white matter junctions are distinct.  Basal cisterns are not effaced.  No evidence of acute intracranial hemorrhage.  No depressed skull fractures.  Paranasal sinuses are not opacified.  IMPRESSION: No evidence of acute intracranial hemorrhage, mass lesion, or acute infarct.  Original Report Authenticated By: Marlon Pel, M.D.   Dg Chest Port 1 View  03/27/2011  *RADIOLOGY REPORT*  Clinical Data: Right IJ central line, check endotracheal tube position  PORTABLE CHEST - 1 VIEW  Comparison: 03/26/2011  Findings: Cardiomegaly again noted.  Endotracheal tube unchanged in position with tip 1.7 cm above the carina.  NG tube in place. Stable right IJ central line position with tip in distal SVC. Diffuse bilateral airspace disease again noted with slight improvement in aeration of the upper lobes.  Probable bilateral small pleural effusion.  IMPRESSION: Endotracheal tube unchanged in position with tip 1.7 cm above the carina.  NG tube in place.  Stable right IJ central line position with tip in distal SVC.  Diffuse bilateral airspace disease again noted with slight improvement in aeration of the upper lobes. Probable bilateral small pleural effusion.  Original Report Authenticated By: Natasha Mead, M.D.   Dg Chest Port 1 View  03/26/2011  *RADIOLOGY REPORT*  Clinical Data: Intubated.  PORTABLE CHEST - 1 VIEW   Comparison: None.  Findings: Endotracheal tube is 1.6 cm above the carina.  There is cardiomegaly.  Diffuse bilateral airspace disease.  Probable small right effusion.  No acute bony abnormality.  NG tube enters the stomach.  IMPRESSION: Endotracheal tube 1.6 cm above the carina.  Cardiomegaly with diffuse bilateral airspace disease, edema versus infection.  Small right effusion.  Original Report Authenticated By: Cyndie Chime, M.D.   Dg Chest Port 1v Same Day  03/26/2011  *RADIOLOGY REPORT*  Clinical Data: Central line placement.  PORTABLE CHEST - 1 VIEW SAME DAY  Comparison: 03/26/2011  Findings: Right central line has been placed with the tip in the SVC.  No pneumothorax.  Endotracheal tube remains less than 2 cm above the carina.  Diffuse bilateral airspace disease again noted with cardiomegaly and small right effusion.  No real change in the appearance of the lungs.  IMPRESSION: Right central line tip in the SVC.  No pneumothorax.  Otherwise no change.  Original Report Authenticated By: Cyndie Chime, M.D.     Assessment/Plan:  Patient Active Hospital Problem List: Seizure (03/28/2011)   Assessment: Patient with the onset of seizure activity with rewarming from arctic sun protocol after PEA arrest.  Patient now on Propofol.  Dilantin ordered and being dosed by pharmacy.  Currently seizure activity continues.  Concerned about the possibility of brain injury secondary to hypoperfusion with the onset of this seizure activity (questionable renal involvement as well).  Initial head CT was unremarkable but was performed early in the course of this patient's presentation.     Plan: 1.  Seizure control  not yet achieved.  Propofol to be dosed to seizure activity since Dilantin has not yet been hung.  Would increase at this time.  Nursing made aware.               2.  Agree with Dilantin             3.  EEG on Monday             4.  Repeat head CT without contrast.  *Case discussed with mother, father  and grandmother-questions addressed.   Thana Farr, MD [facility] [phone] 03/28/2011, 12:52 PM

## 2011-03-28 NOTE — Consult Note (Signed)
Patient seen and examined and agree with assessment and plan as above.  Patient is a young female who's apprarently been sick for a few weeks, possibly a month or two.  Pieces of the history obtained from family comment on recent problems with severe fatigue, swollen legs and abdomen, and very high blood pressure noted on a couple of occassions within the last couple of months.  Patient presented with out-of-hospital arrest and creatinine on admission was mid 2's.  She has heavy dipstick proteinuria, very low albumin, hematuria and pyuria with some dysmorphic RBC's but no obvious RBC casts.  Suspect renal disease may be a primary problem here.  Secondarily this may be complications of unknown severe (malignant) HTN.  Lupus nephritis is a real possibility.  Recommend serologic work-up, renal US and empiric bolus steroids IV.  No need for RRT at this point yet.  Will follow.   Vinson Moselle, MD [employer] [phone] cell 03/28/2011, 6:16 PM

## 2011-03-28 NOTE — Progress Notes (Signed)
Subjective:  Patient remains intubated developed seizures after rewarming and back on propofol and will be started on Dilantin Urine output remains poor with progressive elevation of creatinine  Objective:  Vital Signs in the last 24 hours: Temp:  [91 F (32.8 C)-97.7 F (36.5 C)] 97.7 F (36.5 C) (01/05 1300) Pulse Rate:  [55-86] 81  (01/05 1315) Resp:  [23-24] 24  (01/05 1315) BP: (96-139)/(57-99) 108/57 mmHg (01/05 1300) SpO2:  [97 %-100 %] 99 % (01/05 1315) Arterial Line BP: (106-184)/(54-119) 106/58 mmHg (01/05 1315) FiO2 (%):  [29.8 %-100 %] 29.8 % (01/05 1315) Weight:  [70.4 kg (155 lb 3.3 oz)] 155 lb 3.3 oz (70.4 kg) (01/05 0600)  Intake/Output from previous day: 01/04 0701 - 01/05 0700 In: 1783.1 [I.V.:1233.1; IV Piggyback:550] Out: 268 [Urine:68; Emesis/NG output:200] Intake/Output from this shift: Total I/O In: 486.2 [I.V.:426.2; Other:30; NG/GT:30] Out: 40 [Urine:40]  Physical Exam: Neck: no carotid bruit, no JVD and supple, symmetrical, trachea midline Lungs: Bilateral rhonchi and basilar Rales noted Heart: regular rate and rhythm, S1, S2 normal and Soft systolic murmur and S3 gallop Abdomen: soft, non-tender; bowel sounds normal; no masses,  no organomegaly Extremities: extremities normal, atraumatic, no cyanosis or edema  Lab Results:  Basename 03/27/11 2355 03/27/11 1800 03/27/11 0556 03/26/11 2300  WBC -- -- 11.8* 14.8*  HGB 7.1* 7.5* -- --  PLT -- -- 217 253    Basename 03/28/11 0950 03/28/11 0600  NA 138 140  K 4.4 4.2  CL 113* 115*  CO2 17* 16*  GLUCOSE 78 81  BUN 22 22  CREATININE 3.18* 2.93*    Basename 03/27/11 2200 03/27/11 1440  TROPONINI <0.30 <0.30   Hepatic Function Panel  Basename 03/26/11 2131  PROT 4.5*  ALBUMIN 1.8*  AST 53*  ALT 21  ALKPHOS 66  BILITOT 0.2*  BILIDIR --  IBILI --    Basename 03/26/11 2300  CHOL 296*   No results found for this basename: PROTIME in the last 72 hours  Imaging: Ct Head Wo  Contrast  03/26/2011  *RADIOLOGY REPORT*  Clinical Data: Sudden cardiac arrest of unknown etiology.  CT HEAD WITHOUT CONTRAST  Technique:  Contiguous axial images were obtained from the base of the skull through the vertex without contrast.  Comparison: None.  Findings: The ventricles and sulci are symmetrical.  No mass effect or midline shift.  No abnormal extra-axial fluid collections. Ventricles are not dilated.  Gray-white matter junctions are distinct.  Basal cisterns are not effaced.  No evidence of acute intracranial hemorrhage.  No depressed skull fractures.  Paranasal sinuses are not opacified.  IMPRESSION: No evidence of acute intracranial hemorrhage, mass lesion, or acute infarct.  Original Report Authenticated By: Marlon Pel, M.D.   Dg Chest Port 1 View  03/27/2011  *RADIOLOGY REPORT*  Clinical Data: Right IJ central line, check endotracheal tube position  PORTABLE CHEST - 1 VIEW  Comparison: 03/26/2011  Findings: Cardiomegaly again noted.  Endotracheal tube unchanged in position with tip 1.7 cm above the carina.  NG tube in place. Stable right IJ central line position with tip in distal SVC. Diffuse bilateral airspace disease again noted with slight improvement in aeration of the upper lobes.  Probable bilateral small pleural effusion.  IMPRESSION: Endotracheal tube unchanged in position with tip 1.7 cm above the carina.  NG tube in place.  Stable right IJ central line position with tip in distal SVC.  Diffuse bilateral airspace disease again noted with slight improvement in aeration of the upper lobes.  Probable bilateral small pleural effusion.  Original Report Authenticated By: Natasha Mead, M.D.   Dg Chest Port 1 View  03/26/2011  *RADIOLOGY REPORT*  Clinical Data: Intubated.  PORTABLE CHEST - 1 VIEW  Comparison: None.  Findings: Endotracheal tube is 1.6 cm above the carina.  There is cardiomegaly.  Diffuse bilateral airspace disease.  Probable small right effusion.  No acute bony  abnormality.  NG tube enters the stomach.  IMPRESSION: Endotracheal tube 1.6 cm above the carina.  Cardiomegaly with diffuse bilateral airspace disease, edema versus infection.  Small right effusion.  Original Report Authenticated By: Cyndie Chime, M.D.   Dg Chest Port 1v Same Day  03/26/2011  *RADIOLOGY REPORT*  Clinical Data: Central line placement.  PORTABLE CHEST - 1 VIEW SAME DAY  Comparison: 03/26/2011  Findings: Right central line has been placed with the tip in the SVC.  No pneumothorax.  Endotracheal tube remains less than 2 cm above the carina.  Diffuse bilateral airspace disease again noted with cardiomegaly and small right effusion.  No real change in the appearance of the lungs.  IMPRESSION: Right central line tip in the SVC.  No pneumothorax.  Otherwise no change.  Original Report Authenticated By: Cyndie Chime, M.D.    Cardiac Studies:  Assessment/Plan:  Status post PEA cardiac arrest Hypertensive heart disease with systolic dysfunction with global hypokinesis with EF of 35-40% Acute pulmonary edema Status post viral syndrome rule out myocarditis Acute respiratory failure Pneumonia/ARDS Progressive acute renal failure Anemia New-onset seizures rule out anoxic brain injury Plan Continue present management per CCM Been off Levophed as BP tolerates Prognosis guarded discussed with family  LOS: 2 days    [name] 03/28/2011, 1:26 PM

## 2011-03-28 NOTE — Progress Notes (Addendum)
Name: Myrtis Maille MRN: [id number] DOB: 17-Jul-1990  LOS: 2  CRITICAL CARE ADMISSION NOTE  History of Present Illness: 21 y/o female with no past medical history presented to the [facility] ED after respiratory arrest this evening.  Her mother states that she has had URI symptoms for the past month including cough and shortness of breath.  She was treated with one week of avelox for "walking pneumonia" two weeks ago.  Her symptoms did not improve so she was seen again in urgent care and given an albuterol inhaler and an antihypertensive because she had profoundly elevated blood pressure.  She continued to have orthopnea, pnd, and shortness of breath in the week prior to this admission.  On the night of admission she had the sudden onset of shortness of breath while out with a friend getting food.  He says that he called 911 and she was still talking when EMS arrived, but suddenly developed respiratory arrest after their arrival.  EMS performed CPR for PEA arrest for less than five minutes and she regained a pulse with no meds.  Upon arrival to the [facility] ED she was intubated and started on the arctic sun protocol.  She was hypertensive after arrival.  PCCM consulted for admission.  Lines / Drains: 03/26/11 ETT >> 03/26/11 R IJ CVL >> 03/26/11 R fem CVL >> 03/26/11 L radial a-line >>  Cultures / Sepsis markers: 03/26/11 blood cx x2 >> 03/26/11 sputum cx >> 03/26/11 flu >> NEG 03/26/11 strep/leg ur ag >>neg  03/26/11 - Urine tox >>NEG  03/27/11 HIV test >>NR  Antibiotics: 03/26/11 vanc >>1/5 03/26/11 ceftriaxone >> 03/26/11 moxi >>1/5 03/26/11 tamiflu >>03/27/11  Tests / Events: 03/26/11 CT Head >>No evidence of acute intracranial hemorrhage, mass lesion, or acute  Infarct. 1/4 2 D echo >The cavity size was normal. Wall thickness was increased in a pattern of moderate LVH. Systolic function was moderately reduced. The estimated ejection fraction was in the range of 35% to 40%. Wall motion was normal;  1/4 TEE >> 1/4 venous  doppler bilaterally -NEG for VT  1/5 CT Head >>    INTERVAL HX/OVERNIGHT/SUBJECTIVE   1/5: 2 d echo showed LVH with decreased EF at 35-40%, TEE final results pending to r/o VSD  V. dopp neg for dvt  CT head 1/3 w/ No acute findings Rewarming near completion this am , pt w/ new onset of seizure activity. Family at bedside , updated. Meds adjusted w/ F/V stopped changed to Propofol. Dilantin load /maintence ordered.  Dr. Thad Ranger with neuro consulted. - evaluated pt- pt to cont on Propofol /Dilantin  CT head ordered- concern for anoxic event at initial arrest. Family updated by Dr. Thad Ranger.  Comfort provided.    Vital Signs:   Filed Vitals:   03/28/11 1430  BP:   Pulse: 87  Temp: 98.6 F (37 C)  Resp: 24    Physical Examination: Gen: no purposeful movements on vent, generalized clonic acitivity of extremities  HEENT:   OP clear, ETT in place Neck: supple without masses PULM: coarse breath sounds bilaterally CV: tachy, no mgr, no clear JVD AB: BS infrequent, soft, nontender, no hsm Ext: cool, no edema, no clubbing, no cyanosis Derm: no rash or skin breakdown Neuro: sedated on vent, seizure act  Psyche: cannot assess  Labs and Imaging:    Ct Head Wo Contrast  03/26/2011  *RADIOLOGY REPORT*  Clinical Data: Sudden cardiac arrest of unknown etiology.  CT HEAD WITHOUT CONTRAST  Technique:  Contiguous axial images were obtained  from the base of the skull through the vertex without contrast.  Comparison: None.  Findings: The ventricles and sulci are symmetrical.  No mass effect or midline shift.  No abnormal extra-axial fluid collections. Ventricles are not dilated.  Gray-white matter junctions are distinct.  Basal cisterns are not effaced.  No evidence of acute intracranial hemorrhage.  No depressed skull fractures.  Paranasal sinuses are not opacified.  IMPRESSION: No evidence of acute intracranial hemorrhage, mass lesion, or acute infarct.  Original Report Authenticated By: Marlon Pel, M.D.   Dg Chest Port 1 View  03/27/2011  *RADIOLOGY REPORT*  Clinical Data: Right IJ central line, check endotracheal tube position  PORTABLE CHEST - 1 VIEW  Comparison: 03/26/2011  Findings: Cardiomegaly again noted.  Endotracheal tube unchanged in position with tip 1.7 cm above the carina.  NG tube in place. Stable right IJ central line position with tip in distal SVC. Diffuse bilateral airspace disease again noted with slight improvement in aeration of the upper lobes.  Probable bilateral small pleural effusion.  IMPRESSION: Endotracheal tube unchanged in position with tip 1.7 cm above the carina.  NG tube in place.  Stable right IJ central line position with tip in distal SVC.  Diffuse bilateral airspace disease again noted with slight improvement in aeration of the upper lobes. Probable bilateral small pleural effusion.  Original Report Authenticated By: Natasha Mead, M.D.   Dg Chest Port 1 View  03/26/2011  *RADIOLOGY REPORT*  Clinical Data: Intubated.  PORTABLE CHEST - 1 VIEW  Comparison: None.  Findings: Endotracheal tube is 1.6 cm above the carina.  There is cardiomegaly.  Diffuse bilateral airspace disease.  Probable small right effusion.  No acute bony abnormality.  NG tube enters the stomach.  IMPRESSION: Endotracheal tube 1.6 cm above the carina.  Cardiomegaly with diffuse bilateral airspace disease, edema versus infection.  Small right effusion.  Original Report Authenticated By: Cyndie Chime, M.D.   Dg Chest Port 1v Same Day  03/26/2011  *RADIOLOGY REPORT*  Clinical Data: Central line placement.  PORTABLE CHEST - 1 VIEW SAME DAY  Comparison: 03/26/2011  Findings: Right central line has been placed with the tip in the SVC.  No pneumothorax.  Endotracheal tube remains less than 2 cm above the carina.  Diffuse bilateral airspace disease again noted with cardiomegaly and small right effusion.  No real change in the appearance of the lungs.  IMPRESSION: Right central line tip in the SVC.  No  pneumothorax.  Otherwise no change.  Original Report Authenticated By: Cyndie Chime, M.D.     CBC    Component Value Date/Time   WBC 11.8* 03/27/2011 0556   RBC 2.42* 03/27/2011 0556   HGB 7.1* 03/27/2011 2355   HCT 20.8* 03/27/2011 2355   PLT 217 03/27/2011 0556   MCV 86.8 03/27/2011 0556   MCH 29.8 03/27/2011 0556   MCHC 34.3 03/27/2011 0556   RDW 13.8 03/27/2011 0556    BMET    Component Value Date/Time   NA 137 03/28/2011 1350   K 4.4 03/28/2011 1350   CL 112 03/28/2011 1350   CO2 17* 03/28/2011 1350   GLUCOSE 86 03/28/2011 1350   BUN 23 03/28/2011 1350   CREATININE 3.39* 03/28/2011 1350   CALCIUM 6.9* 03/28/2011 1350   GFRNONAA 18* 03/28/2011 1350   GFRAA 21* 03/28/2011 1350   Lab Results  Component Value Date   CREATININE 3.39* 03/28/2011   BUN 23 03/28/2011   NA 137 03/28/2011   K 4.4  03/28/2011   CL 112 03/28/2011   CO2 17* 03/28/2011   No components found with this basename: RENAL:3]  Recent Labs  Basename 03/28/11 1350 03/28/11 0950 03/28/11 0600 03/28/11 0054   BUN 23 22 22 21   ]  Assessment and Plan:  21 y/o female with no past medical history who presented with the acute onset of shortness of breath and then respiratory failure 1/3 after one month of URI symptoms and progressive shortness of breath.  Objectively she has pulmonary edema and a large heart on CXR and is profoundly hypertensive with acute renal failure on admission 1/3   DDx includes acute LV failure vs. ARDS from pneumonia (bacterial or viral) vs. a less likely etiology such as a vasculitis.  Given CXR findings less likely to be PE.    Respiratory failure (03/26/2011)   Assessment: Due to pulmonary edema (most likely) vs. Pneumonia.   Plan:  -full vent support -follow cx data  -cxr today  -ABG today   Cardiac arrest   Assessment: unresponsive on arrival here 2D echo 1/4 shows LVH with EF 35% , TEE final pending to r/o VSD     Plan: -arctic sun protocol-rewarming completed 1/5  -cards on board  -  AKI (acute kidney  injury) (03/26/2011)  Lab 03/28/11 1350 03/28/11 0950 03/28/11 0600 03/28/11 0054 03/27/11 2200  CREATININE 3.39* 3.18* 2.93* 2.69* 2.69*   Scr tr up with decreasing UOP     Assessment:     Plan:  -consult nephrology -spoke with Dr. Arta Silence   Acute heart failure (03/26/2011)   Assessment: non-ischaemic, viral? EKG without clear ischaemia   Plan:  -follow up cards recs -  -TEE final results pending.   Pneumonia (03/26/2011)   Assessment: community acquired, unclear if aspirated as part of code. Flu negative HIV neg .    Plan:  -d/c vanc and avelox - cx ngtd   -follow cx data  -cont rocephin   Anemia (03/26/2011)   Assessment: uncertain etiology, no clear evidence of bleed   Plan:  -check hct now -prbc for hgb < 8gm%  New Onset Seizure Activity (03/28/2011)  - change to propofol -d/c fent/versed  -begin dilantin w/ load and maint with pharm consult - appreciate neuro consult -for CT head now  -As needed  Ativan   Shock:  Remains on pressor support w/ Levo  -titrate for MAP >60   Family updated at bedside.  Best practices / Disposition: ICU status on PCCM service  Feeding/protein malnutrition: npo Analgesia:  Sedation: propofol Thromboprophylaxis: sub q hep HOB >30 degrees Ulcer prophylaxis: famotidine Glucose control/hyperglycemia: follow cbg Diet : trickle feeds 1/5      PARRETT,TAMMY NP-C    03/28/2011, 2:42 PM   Pt seen and examined and database reviewed. I agree with above findings, assessment and plan  Billy Fischer, MD;  PCCM service; Mobile [phone]

## 2011-03-28 NOTE — Progress Notes (Signed)
MEDICATION RELATED CONSULT NOTE - INITIAL   Pharmacy Consult for Phenytoin  Indication: Seizures  No Known Allergies  Patient Measurements: Height: 5\' 2"  (157.5 cm) Weight: 155 lb 3.3 oz (70.4 kg) IBW/kg (Calculated) : 50.1   Vital Signs: Temp: 97.2 F (36.2 C) (01/05 1200) Temp src: Core (Comment) (01/05 1200) BP: 106/58 mmHg (01/05 1200) Pulse Rate: 81  (01/05 1200) Intake/Output from previous day: 01/04 0701 - 01/05 0700 In: 1783.1 [I.V.:1233.1; IV Piggyback:550] Out: 268 [Urine:68; Emesis/NG output:200] Intake/Output from this shift: Total I/O In: 335.6 [I.V.:305.6; NG/GT:30] Out: 0   Labs:  Basename 03/28/11 0950 03/28/11 0600 03/28/11 0054 03/27/11 2355 03/27/11 1800 03/27/11 1500 03/27/11 0620 03/27/11 0556 03/26/11 2352 03/26/11 2300 03/26/11 2131 03/26/11 2022  WBC -- -- -- -- -- -- -- 11.8* -- 14.8* -- --  HGB -- -- -- 7.1* 7.5* 7.4* -- -- -- -- -- --  HCT -- -- -- 20.8* 21.8* 22.1* -- -- -- -- -- --  PLT -- -- -- -- -- -- -- 217 -- 253 -- --  APTT -- -- -- -- -- -- 37 -- -- -- 34 --  CREATININE 3.18* 2.93* 2.69* -- -- -- -- -- -- -- -- --  LABCREA -- -- -- -- -- -- -- -- 50.14 -- -- --  CREATININE 3.18* 2.93* 2.69* -- -- -- -- -- -- -- -- --  CREAT24HRUR -- -- -- -- -- -- -- -- -- -- -- --  MG -- -- -- -- -- -- -- -- -- -- -- --  PHOS -- -- -- -- -- -- -- -- -- -- -- --  ALBUMIN -- -- -- -- -- -- -- -- -- -- 1.8* 2.0*  PROT -- -- -- -- -- -- -- -- -- -- 4.5* 5.4*  ALBUMIN -- -- -- -- -- -- -- -- -- -- 1.8* 2.0*  AST -- -- -- -- -- -- -- -- -- -- 53* 47*  ALT -- -- -- -- -- -- -- -- -- -- 21 21  ALKPHOS -- -- -- -- -- -- -- -- -- -- 66 77  BILITOT -- -- -- -- -- -- -- -- -- -- 0.2* 0.2*  BILIDIR -- -- -- -- -- -- -- -- -- -- -- --  IBILI -- -- -- -- -- -- -- -- -- -- -- --   Estimated Creatinine Clearance: 25.7 ml/min (by C-G formula based on Cr of 3.18).   Microbiology: Recent Results (from the past 720 hour(s))  CULTURE, BLOOD (ROUTINE X 2)      Status: Normal (Preliminary result)   Collection Time   03/26/11  8:20 PM      Component Value Range Status Comment   Specimen Description BLOOD ARM RIGHT   Final    Special Requests BOTTLES DRAWN AEROBIC AND ANAEROBIC 10CC   Final    Setup Time [id number]   Final    Culture     Final    Value:        BLOOD CULTURE RECEIVED NO GROWTH TO DATE CULTURE WILL BE HELD FOR 5 DAYS BEFORE ISSUING A FINAL NEGATIVE REPORT   Report Status PENDING   Incomplete   CULTURE, BLOOD (ROUTINE X 2)     Status: Normal (Preliminary result)   Collection Time   03/26/11  8:27 PM      Component Value Range Status Comment   Specimen Description BLOOD CENTRAL LINE   Final    Special Requests  Final    Value: BOTTLES DRAWN AEROBIC AND ANAEROBIC AERO 8CC, ANAE 5CC   Setup Time [id number]   Final    Culture     Final    Value:        BLOOD CULTURE RECEIVED NO GROWTH TO DATE CULTURE WILL BE HELD FOR 5 DAYS BEFORE ISSUING A FINAL NEGATIVE REPORT   Report Status PENDING   Incomplete   MRSA PCR SCREENING     Status: Normal   Collection Time   03/26/11 11:33 PM      Component Value Range Status Comment   MRSA by PCR NEGATIVE  NEGATIVE  Final   CULTURE, RESPIRATORY     Status: Normal (Preliminary result)   Collection Time   03/27/11  1:11 AM      Component Value Range Status Comment   Specimen Description ENDOTRACHEAL ASPIRATE   Final    Special Requests NONE   Final    Gram Stain     Final    Value: FEW WBC PRESENT, PREDOMINANTLY PMN     FEW SQUAMOUS EPITHELIAL CELLS PRESENT     RARE GRAM POSITIVE COCCI     IN PAIRS   Culture NORMAL OROPHARYNGEAL FLORA   Final    Report Status PENDING   Incomplete     Medical History: Past Medical History  Diagnosis Date  . Pneumonia last 2 weeks    'walking pneumonia'    Medications:  Scheduled:    . antiseptic oral rinse  15 mL Mouth Rinse QID  . artificial tears  1 application Both Eyes Q8H  . aspirin  300 mg Rectal NOW  . cefTRIAXone (ROCEPHIN) IVPB 1 gram/50 mL  D5W  1 g Intravenous Q24H  . chlorhexidine  15 mL Mouth Rinse BID  . dextrose      . famotidine (PEPCID) IV  20 mg Intravenous QHS  . fentaNYL  25 mcg Intravenous STAT  . fentaNYL  50 mcg Intravenous STAT  . heparin  5,000 Units Subcutaneous Q8H  . influenza  inactive virus vaccine  0.5 mL Intramuscular Tomorrow-1000  . labetalol  20 mg Intravenous STAT  . LORazepam      . midazolam  1 mg Intravenous STAT  . midazolam  1 mg Intravenous STAT  . moxifloxacin  400 mg Intravenous Q24H  . sodium chloride  3 mL Intravenous Q12H  . vancomycin  750 mg Intravenous Q24H  . DISCONTD: fentaNYL  25 mcg Intravenous Once  . DISCONTD: fentaNYL  50 mcg Intravenous STAT  . DISCONTD: labetalol  20 mg Intravenous STAT  . DISCONTD: midazolam  1 mg Intravenous Once  . DISCONTD: midazolam  1 mg Intravenous Once  . DISCONTD: vancomycin  1,000 mg Intravenous Q24H   Infusions:    . sodium chloride 10 mL/hr at 03/28/11 0115  . labetalol (NORMODYNE) infusion 20 mg/min (03/27/11 1330)  . norepinephrine (LEVOPHED) Adult infusion 12 mcg/min (03/28/11 1128)  . propofol 30 mcg/kg/min (03/28/11 1203)  . DISCONTD: cisatracurium (NIMBEX) infusion Stopped (03/28/11 1102)  . DISCONTD: fentaNYL infusion INTRAVENOUS Stopped (03/28/11 1153)  . DISCONTD: midazolam (VERSED) infusion Stopped (03/28/11 1153)   PRN: sodium chloride, sodium chloride, hydrALAZINE, labetalol, LORazepam, sodium chloride  Assessment: Miss. Bazin is a 12 yof s/p respiratory arrest and PEA now off of fent/versed/nimbex and on propofol and experiencing seizures. Phenytoin per pharmacy ordered per NP requesting loading dose as well. Pts LFTs from 03/26/11 ok. Albumin 1.8. Pt renal function progressively declining.   Goal of Therapy:  Phenytoin level  10-20 mg/L  Plan:  1. Phenytoin load of 1000mg  IV x 1  2. Then maintenance dose of 100 mg IV q8h 3. F/u phenytoin level 03/29/11 to ensure adequate load then phenytoin level in 5 days.   Thank you,    Juliann Pulse 03/28/2011,12:31 PM

## 2011-03-29 ENCOUNTER — Inpatient Hospital Stay (HOSPITAL_COMMUNITY): Payer: Medicaid Other

## 2011-03-29 LAB — POCT I-STAT 3, ART BLOOD GAS (G3+)
Bicarbonate: 15.9 mEq/L — ABNORMAL LOW (ref 20.0–24.0)
pH, Arterial: 7.34 — ABNORMAL LOW (ref 7.350–7.400)
pO2, Arterial: 89 mmHg (ref 80.0–100.0)

## 2011-03-29 LAB — HEPATITIS PANEL, ACUTE
Hep A IgM: NEGATIVE
Hep B C IgM: NEGATIVE
Hepatitis B Surface Ag: NEGATIVE

## 2011-03-29 LAB — CBC
MCV: 85.9 fL (ref 78.0–100.0)
Platelets: 259 10*3/uL (ref 150–400)
RDW: 15.2 % (ref 11.5–15.5)
WBC: 13.4 10*3/uL — ABNORMAL HIGH (ref 4.0–10.5)

## 2011-03-29 LAB — RENAL FUNCTION PANEL
Albumin: 1.6 g/dL — ABNORMAL LOW (ref 3.5–5.2)
Chloride: 107 mEq/L (ref 96–112)
Creatinine, Ser: 3.81 mg/dL — ABNORMAL HIGH (ref 0.50–1.10)
GFR calc Af Amer: 18 mL/min — ABNORMAL LOW (ref >90)
GFR calc non Af Amer: 16 mL/min — ABNORMAL LOW (ref >90)
Potassium: 5.6 mEq/L — ABNORMAL HIGH (ref 3.5–5.1)
Sodium: 136 mEq/L (ref 135–145)

## 2011-03-29 LAB — CULTURE, RESPIRATORY W GRAM STAIN

## 2011-03-29 LAB — PROTEIN / CREATININE RATIO, URINE
Protein Creatinine Ratio: 0.4 — ABNORMAL HIGH (ref 0.00–0.15)
Total Protein, Urine: 50 mg/dL

## 2011-03-29 LAB — GLUCOSE, CAPILLARY
Glucose-Capillary: 165 mg/dL — ABNORMAL HIGH (ref 70–99)
Glucose-Capillary: 137 mg/dL — ABNORMAL HIGH (ref 70–99)

## 2011-03-29 MED ORDER — INSULIN ASPART 100 UNIT/ML ~~LOC~~ SOLN
0.0000 [IU] | SUBCUTANEOUS | Status: DC
  Administered 2011-03-29 (×3): 2 [IU] via SUBCUTANEOUS
  Administered 2011-03-29 – 2011-03-30 (×2): 3 [IU] via SUBCUTANEOUS
  Administered 2011-03-30 – 2011-03-31 (×4): 2 [IU] via SUBCUTANEOUS
  Filled 2011-03-29: qty 3

## 2011-03-29 MED ORDER — SODIUM POLYSTYRENE SULFONATE 15 GM/60ML PO SUSP
30.0000 g | Freq: Once | ORAL | Status: AC
  Administered 2011-03-29: 30 g via ORAL
  Filled 2011-03-29: qty 120

## 2011-03-29 MED ORDER — FUROSEMIDE 10 MG/ML IJ SOLN
80.0000 mg | Freq: Once | INTRAMUSCULAR | Status: AC
  Administered 2011-03-29: 80 mg via INTRAVENOUS
  Filled 2011-03-29: qty 8

## 2011-03-29 MED ORDER — NEPRO/CARBSTEADY PO LIQD
1000.0000 mL | ORAL | Status: DC
  Administered 2011-03-29 – 2011-03-31 (×3): 1000 mL via ORAL
  Filled 2011-03-29 (×5): qty 1000

## 2011-03-29 NOTE — ED Provider Notes (Signed)
History     I saw and evaluated the patient, reviewed the resident's note and I agree with the findings and plan.  20yF with respiratory Failure. Witnessed arrest by EMS just prior to arrival. PEA with ACLS for 5 minutes. ROSC shortly after intubation prehospital Per mother, recently treated for "walking pneumonia" and just finished a course of avelox a few days ago. Has been having cough and wheezing. No significant pmhx.   CSN: [id number]  Arrival date & time 03/26/11  7829   First MD Initiated Contact with Patient 03/26/11 2029      Chief Complaint  Patient presents with  . Cardiac Arrest    (Consider location/radiation/quality/duration/timing/severity/associated sxs/prior treatment) HPI  Past Medical History  Diagnosis Date  . Pneumonia last 2 weeks    'walking pneumonia'    No past surgical history on file.  No family history on file.  History  Substance Use Topics  . Smoking status: Never Smoker   . Smokeless tobacco: Not on file  . Alcohol Use: No    OB History    Grav Para Term Preterm Abortions TAB SAB Ect Mult Living                  Review of Systems  Level 5 caveat applies because pt intubated.  Allergies  Review of patient's allergies indicates no known allergies.  Home Medications  No current outpatient prescriptions on file.  BP 102/68  Pulse 88  Temp(Src) 98.1 F (36.7 C) (Oral)  Resp 24  Ht 5\' 2"  (1.575 m)  Wt 159 lb 6.3 oz (72.3 kg)  BMI 29.15 kg/m2  SpO2 99%  LMP 03/02/2011  Physical Exam  Nursing note and vitals reviewed. Constitutional: She appears well-developed and well-nourished. She appears distressed.       No external signs of trauma  HENT:  Head: Normocephalic and atraumatic.  Right Ear: External ear normal.  Left Ear: External ear normal.  Eyes: Conjunctivae are normal.       4mm a sluggish to react bl  Neck: Neck supple.  Cardiovascular: Normal heart sounds.        tachycardic  Pulmonary/Chest: She is in  respiratory distress.       Intubated. Airway confirmed with direct visualization and quantitative capnography. equal chest rise. Coarse breath sounds b/l.  Abdominal: Soft. She exhibits distension. She exhibits no mass. There is no tenderness.  Musculoskeletal: She exhibits no edema.  Lymphadenopathy:    She has no cervical adenopathy.  Neurological:       GCS 6T E1, M4, V1T. Some spontaneous respiratory effort.  Skin: Skin is warm and dry. No rash noted. She is not diaphoretic. No erythema.    ED Course  OG placement Date/Time: 03/26/2011 10:34 AM Performed by: Raeford Razor Authorized by: Raeford Razor Consent: Verbal consent not obtained. Written consent not obtained. The procedure was performed in an emergent situation. Required items: required blood products, implants, devices, and special equipment available Patient identity confirmed: provided demographic data and arm band Local anesthesia used: no Patient tolerance: Patient tolerated the procedure well with no immediate complications.   CRITICAL CARE Performed by: Raeford Razor  ?  Total critical care time: 35 minutes  Critical care time was exclusive of separately billable procedures and treating other patients.  Critical care was necessary to treat or prevent imminent or life-threatening deterioration.  Critical care was time spent personally by me on the following activities: development of treatment plan with patient and/or surrogate as well as nursing,  discussions with consultants, evaluation of patient's response to treatment, examination of patient, obtaining history from patient or surrogate, ordering and performing treatments and interventions, ordering and review of laboratory studies, ordering and review of radiographic studies, pulse oximetry and re-evaluation of patient's condition.   (including critical care time)  Labs Reviewed  COMPREHENSIVE METABOLIC PANEL - Abnormal; Notable for the following:     Potassium 5.3 (*) HEMOLYSIS AT THIS LEVEL MAY AFFECT RESULT   CO2 13 (*)    Glucose, Bld 208 (*)    Creatinine, Ser 2.31 (*)    Calcium 8.1 (*)    Total Protein 5.4 (*)    Albumin 2.0 (*)    AST 47 (*) HEMOLYSIS AT THIS LEVEL MAY AFFECT RESULT   Total Bilirubin 0.2 (*)    GFR calc non Af Amer 29 (*)    GFR calc Af Amer 34 (*)    All other components within normal limits  LACTIC ACID, PLASMA - Abnormal; Notable for the following:    Lactic Acid, Venous 9.1 (*)    All other components within normal limits  URINALYSIS, ROUTINE W REFLEX MICROSCOPIC - Abnormal; Notable for the following:    APPearance CLOUDY (*)    Glucose, UA 100 (*)    Hgb urine dipstick LARGE (*)    Protein, ur >300 (*)    All other components within normal limits  POCT I-STAT 3, BLOOD GAS (G3+) - Abnormal; Notable for the following:    pH, Arterial 7.158 (*)    pCO2 arterial 50.6 (*)    pO2, Arterial 311.0 (*)    Bicarbonate 17.9 (*)    Acid-base deficit 10.0 (*)    All other components within normal limits  URINE MICROSCOPIC-ADD ON - Abnormal; Notable for the following:    Squamous Epithelial / LPF MANY (*)    Bacteria, UA FEW (*)    Casts HYALINE CASTS (*)    All other components within normal limits  BASIC METABOLIC PANEL - Abnormal; Notable for the following:    Chloride 113 (*)    CO2 18 (*)    Glucose, Bld 130 (*)    Creatinine, Ser 1.95 (*)    Calcium 7.1 (*)    GFR calc non Af Amer 36 (*)    GFR calc Af Amer 41 (*)    All other components within normal limits  BASIC METABOLIC PANEL - Abnormal; Notable for the following:    CO2 17 (*)    Glucose, Bld 121 (*)    Creatinine, Ser 2.00 (*)    Calcium 7.0 (*)    GFR calc non Af Amer 35 (*)    GFR calc Af Amer 40 (*)    All other components within normal limits  BASIC METABOLIC PANEL - Abnormal; Notable for the following:    CO2 18 (*)    Glucose, Bld 116 (*)    Creatinine, Ser 2.05 (*)    Calcium 6.9 (*)    GFR calc non Af Amer 34 (*)    GFR  calc Af Amer 39 (*)    All other components within normal limits  POCT I-STAT, CHEM 8 - Abnormal; Notable for the following:    Chloride 113 (*)    Creatinine, Ser 2.20 (*)    Glucose, Bld 211 (*)    Calcium, Ion 1.09 (*)    Hemoglobin 8.8 (*)    HCT 26.0 (*)    All other components within normal limits  CARDIAC PANEL(CRET KIN+CKTOT+MB+TROPI) - Abnormal; Notable  for the following:    Total CK 2085 (*)    CK, MB 14.5 (*)    Troponin I 0.35 (*)    All other components within normal limits  COMPREHENSIVE METABOLIC PANEL - Abnormal; Notable for the following:    Potassium 5.4 (*)    Chloride 115 (*)    Glucose, Bld 125 (*)    Creatinine, Ser 2.12 (*)    Calcium 7.0 (*)    Total Protein 4.5 (*)    Albumin 1.8 (*)    AST 53 (*)    Total Bilirubin 0.2 (*)    GFR calc non Af Amer 32 (*)    GFR calc Af Amer 37 (*)    All other components within normal limits  PRO B NATRIURETIC PEPTIDE - Abnormal; Notable for the following:    Pro B Natriuretic peptide (BNP) 11557.0 (*)    All other components within normal limits  CBC - Abnormal; Notable for the following:    WBC 11.8 (*)    RBC 2.42 (*)    Hemoglobin 7.2 (*)    HCT 21.0 (*)    All other components within normal limits  CARBOXYHEMOGLOBIN - Abnormal; Notable for the following:    Total hemoglobin 8.6 (*)    All other components within normal limits  CARBOXYHEMOGLOBIN - Abnormal; Notable for the following:    Total hemoglobin 8.8 (*)    All other components within normal limits  CARBOXYHEMOGLOBIN - Abnormal; Notable for the following:    Total hemoglobin 7.1 (*)    All other components within normal limits  URINALYSIS, ROUTINE W REFLEX MICROSCOPIC - Abnormal; Notable for the following:    Hgb urine dipstick LARGE (*)    Protein, ur >300 (*)    All other components within normal limits  LIPID PANEL - Abnormal; Notable for the following:    Cholesterol 296 (*)    LDL Cholesterol 213 (*)    All other components within normal  limits  HEMOGLOBIN AND HEMATOCRIT, BLOOD - Abnormal; Notable for the following:    Hemoglobin 7.5 (*)    HCT 21.8 (*)    All other components within normal limits  CBC - Abnormal; Notable for the following:    WBC 14.8 (*)    RBC 2.71 (*)    Hemoglobin 8.1 (*)    HCT 23.9 (*)    All other components within normal limits  GLUCOSE, CAPILLARY - Abnormal; Notable for the following:    Glucose-Capillary 104 (*)    All other components within normal limits  GLUCOSE, CAPILLARY - Abnormal; Notable for the following:    Glucose-Capillary 116 (*)    All other components within normal limits  GLUCOSE, CAPILLARY - Abnormal; Notable for the following:    Glucose-Capillary 107 (*)    All other components within normal limits  GLUCOSE, CAPILLARY - Abnormal; Notable for the following:    Glucose-Capillary 109 (*)    All other components within normal limits  GLUCOSE, CAPILLARY - Abnormal; Notable for the following:    Glucose-Capillary 129 (*)    All other components within normal limits  GLUCOSE, CAPILLARY - Abnormal; Notable for the following:    Glucose-Capillary 122 (*)    All other components within normal limits  BASIC METABOLIC PANEL - Abnormal; Notable for the following:    Chloride 115 (*)    Glucose, Bld 104 (*)    Creatinine, Ser 2.21 (*)    Calcium 6.9 (*)    GFR calc non Af  Amer 31 (*)    GFR calc Af Amer 36 (*)    All other components within normal limits  BASIC METABOLIC PANEL - Abnormal; Notable for the following:    Chloride 115 (*)    CO2 18 (*)    Creatinine, Ser 2.33 (*)    Calcium 6.8 (*)    GFR calc non Af Amer 29 (*)    GFR calc Af Amer 33 (*)    All other components within normal limits  BASIC METABOLIC PANEL - Abnormal; Notable for the following:    Chloride 116 (*)    CO2 18 (*)    Creatinine, Ser 2.49 (*)    Calcium 7.0 (*)    GFR calc non Af Amer 27 (*)    GFR calc Af Amer 31 (*)    All other components within normal limits  BASIC METABOLIC PANEL -  Abnormal; Notable for the following:    Chloride 115 (*)    CO2 18 (*)    Creatinine, Ser 2.69 (*)    Calcium 7.0 (*)    GFR calc non Af Amer 24 (*)    GFR calc Af Amer 28 (*)    All other components within normal limits  CARDIAC PANEL(CRET KIN+CKTOT+MB+TROPI) - Abnormal; Notable for the following:    Total CK 2318 (*)    CK, MB 13.6 (*) CRITICAL VALUE NOTED.  VALUE IS CONSISTENT WITH PREVIOUSLY REPORTED AND CALLED VALUE.   All other components within normal limits  CARDIAC PANEL(CRET KIN+CKTOT+MB+TROPI) - Abnormal; Notable for the following:    Total CK 2257 (*)    CK, MB 13.4 (*) CRITICAL VALUE NOTED.  VALUE IS CONSISTENT WITH PREVIOUSLY REPORTED AND CALLED VALUE.   All other components within normal limits  CARDIAC PANEL(CRET KIN+CKTOT+MB+TROPI) - Abnormal; Notable for the following:    Total CK 1822 (*)    CK, MB 12.1 (*) CRITICAL VALUE NOTED.  VALUE IS CONSISTENT WITH PREVIOUSLY REPORTED AND CALLED VALUE.   All other components within normal limits  BASIC METABOLIC PANEL - Abnormal; Notable for the following:    Chloride 113 (*)    CO2 18 (*)    Glucose, Bld 124 (*)    Creatinine, Ser 2.19 (*)    Calcium 7.1 (*)    GFR calc non Af Amer 31 (*)    GFR calc Af Amer 36 (*)    All other components within normal limits  GLUCOSE, CAPILLARY - Abnormal; Notable for the following:    Glucose-Capillary 122 (*)    All other components within normal limits  GLUCOSE, CAPILLARY - Abnormal; Notable for the following:    Glucose-Capillary 106 (*)    All other components within normal limits  PROTIME-INR - Abnormal; Notable for the following:    Prothrombin Time 15.6 (*)    All other components within normal limits  GLUCOSE, CAPILLARY - Abnormal; Notable for the following:    Glucose-Capillary 113 (*)    All other components within normal limits  HEMOGLOBIN AND HEMATOCRIT, BLOOD - Abnormal; Notable for the following:    Hemoglobin 7.4 (*)    HCT 22.1 (*)    All other components within  normal limits  BASIC METABOLIC PANEL - Abnormal; Notable for the following:    Chloride 115 (*)    CO2 17 (*)    Creatinine, Ser 2.69 (*)    Calcium 7.0 (*)    GFR calc non Af Amer 24 (*)    GFR calc Af Amer 28 (*)  All other components within normal limits  BASIC METABOLIC PANEL - Abnormal; Notable for the following:    Chloride 115 (*)    CO2 16 (*)    Creatinine, Ser 2.93 (*)    Calcium 6.9 (*)    GFR calc non Af Amer 22 (*)    GFR calc Af Amer 25 (*)    All other components within normal limits  BASIC METABOLIC PANEL - Abnormal; Notable for the following:    Chloride 113 (*)    CO2 17 (*)    Creatinine, Ser 3.18 (*)    Calcium 6.9 (*)    GFR calc non Af Amer 20 (*)    GFR calc Af Amer 23 (*)    All other components within normal limits  HEMOGLOBIN AND HEMATOCRIT, BLOOD - Abnormal; Notable for the following:    Hemoglobin 7.1 (*)    HCT 20.8 (*)    All other components within normal limits  BASIC METABOLIC PANEL - Abnormal; Notable for the following:    CO2 17 (*)    Creatinine, Ser 3.39 (*)    Calcium 6.9 (*)    GFR calc non Af Amer 18 (*)    GFR calc Af Amer 21 (*)    All other components within normal limits  GLUCOSE, CAPILLARY - Abnormal; Notable for the following:    Glucose-Capillary 69 (*)    All other components within normal limits  GLUCOSE, CAPILLARY - Abnormal; Notable for the following:    Glucose-Capillary 104 (*)    All other components within normal limits  CBC - Abnormal; Notable for the following:    RBC 2.19 (*)    Hemoglobin 6.6 (*)    HCT 19.2 (*)    All other components within normal limits  POCT I-STAT 3, BLOOD GAS (G3+) - Abnormal; Notable for the following:    pH, Arterial 7.409 (*)    pCO2 arterial 24.7 (*)    pO2, Arterial 106.0 (*)    Bicarbonate 15.6 (*)    Acid-base deficit 8.0 (*)    All other components within normal limits  HEPATIC FUNCTION PANEL - Abnormal; Notable for the following:    Total Protein 3.8 (*)    Albumin 1.3  (*)    AST 40 (*)    Total Bilirubin 0.1 (*)    All other components within normal limits  LACTATE DEHYDROGENASE - Abnormal; Notable for the following:    LD 282 (*)    All other components within normal limits  RENAL FUNCTION PANEL - Abnormal; Notable for the following:    CO2 16 (*)    Glucose, Bld 107 (*)    Creatinine, Ser 3.54 (*)    Calcium 6.9 (*)    Phosphorus 5.7 (*)    Albumin 1.3 (*)    GFR calc non Af Amer 17 (*)    GFR calc Af Amer 20 (*)    All other components within normal limits  URIC ACID - Abnormal; Notable for the following:    Uric Acid, Serum 7.1 (*)    All other components within normal limits  PROTEIN / CREATININE RATIO, URINE - Abnormal; Notable for the following:    PROTEIN CREATININE RATIO 0.40 (*)    All other components within normal limits  RENAL FUNCTION PANEL - Abnormal; Notable for the following:    Potassium 5.6 (*) HEMOLYSIS AT THIS LEVEL MAY AFFECT RESULT   CO2 15 (*)    Glucose, Bld 134 (*)    BUN  24 (*)    Creatinine, Ser 3.81 (*)    Calcium 7.5 (*)    Phosphorus 7.1 (*)    Albumin 1.6 (*)    GFR calc non Af Amer 16 (*)    GFR calc Af Amer 18 (*)    All other components within normal limits  GLUCOSE, CAPILLARY - Abnormal; Notable for the following:    Glucose-Capillary 114 (*)    All other components within normal limits  CBC - Abnormal; Notable for the following:    WBC 13.4 (*)    Hemoglobin 11.7 (*) POST TRANSFUSION SPECIMEN   HCT 34.7 (*)    All other components within normal limits  GLUCOSE, CAPILLARY - Abnormal; Notable for the following:    Glucose-Capillary 165 (*)    All other components within normal limits  POCT I-STAT 3, BLOOD GAS (G3+) - Abnormal; Notable for the following:    pH, Arterial 7.340 (*)    pCO2 arterial 29.3 (*)    Bicarbonate 15.9 (*)    Acid-base deficit 9.0 (*)    All other components within normal limits  GLUCOSE, CAPILLARY - Abnormal; Notable for the following:    Glucose-Capillary 137 (*)     All other components within normal limits  TYPE AND SCREEN  CULTURE, BLOOD (ROUTINE X 2)  CULTURE, BLOOD (ROUTINE X 2)  URINE RAPID DRUG SCREEN (HOSP PERFORMED)  POCT PREGNANCY, URINE  ABO/RH  PROTIME-INR  APTT  POCT I-STAT TROPONIN I  AMYLASE  LIPASE, BLOOD  PROCALCITONIN  CORTISOL  DRUGS OF ABUSE SCREEN W/O ALC, ROUTINE URINE  STREP PNEUMONIAE URINARY ANTIGEN  LEGIONELLA ANTIGEN, URINE  SODIUM, URINE, RANDOM  CREATININE, URINE, RANDOM  CULTURE, RESPIRATORY  MRSA PCR SCREENING  INFLUENZA PANEL BY PCR  URINE MICROSCOPIC-ADD ON  APTT  GLUCOSE, CAPILLARY  HIV ANTIBODY (ROUTINE TESTING)  GLUCOSE, CAPILLARY  GLUCOSE, CAPILLARY  GLUCOSE, CAPILLARY  GLUCOSE, CAPILLARY  GLUCOSE, CAPILLARY  LACTIC ACID, PLASMA  LACTIC ACID, PLASMA  LACTIC ACID, PLASMA  GLUCOSE, CAPILLARY  GLUCOSE, CAPILLARY  GLUCOSE, CAPILLARY  GLUCOSE, CAPILLARY  DIFFERENTIAL  GLUCOSE, CAPILLARY  MAGNESIUM  CORTISOL  HEPATITIS PANEL, ACUTE  DIRECT ANTIGLOBULIN TEST  PREPARE RBC (CROSSMATCH)  MAGNESIUM  PREPARE RBC (CROSSMATCH)  MPO/PR-3 (ANCA) ANTIBODIES  POCT RAPID INFLUENZA A&B  ANA  HAPTOGLOBIN  PATHOLOGIST SMEAR REVIEW  PROTEIN ELECTROPHORESIS, SERUM  KAPPA/LAMBDA LIGHT CHAINS  ANA  MPO/PR-3 (ANCA) ANTIBODIES  C3 COMPLEMENT  C4 COMPLEMENT  GLOMERULAR BASEMENT MEMBRANE ANTIBODIES  ANTISTREPTOLYSIN O TITER  ANTI-DNA ANTIBODY, DOUBLE-STRANDED  CRYOGLOBULIN  PARATHYROID HORMONE, INTACT (NO CA)  ANTI-NEUTROPHIL ANTIBODY  PHENYTOIN LEVEL, TOTAL  PROTEIN ELECTROPHORESIS, URINE  POCT CBG MONITORING   Ct Head Wo Contrast  03/28/2011  *RADIOLOGY REPORT*  Clinical Data: Altered mental status.  Seizures.  Recent cardiopulmonary resuscitation.  CT HEAD WITHOUT CONTRAST  Technique:  Contiguous axial images were obtained from the base of the skull through the vertex without contrast.  Comparison: 03/26/2011.  Findings: The cerebral hemispheres and posterior fossa structures continue to have normal  appearances.  Hemorrhage, mass lesion or CT evidence of acute infarction.  The ventricles remain normal in size and position.  Interval mucosal thickening and probable retained secretions in the posterior sphenoid sinus on the left.  IMPRESSION:  Interval left sphenoid sinusitis.  Otherwise, unremarkable examination.  Original Report Authenticated By: Darrol Angel, M.D.   Dg Chest Port 1 View  03/29/2011  *RADIOLOGY REPORT*  Clinical Data: Antegrade, pneumonia  PORTABLE CHEST - 1 VIEW  Comparison: 03/28/2011  Findings:  Endotracheal tube at the carina directed to the right main stem.  This should be retracted 3 cm.  NG tube in the stomach. Right IJ central line in the SVC region.  Heart remains enlarged with dense left lower lobe consolidation/collapse.  No significant interval change.  No pneumothorax.  IMPRESSION: Low endotracheal tube at the carina directed to the right main stem should be retracted 3 cm.  Otherwise stable chest exam.  Findings called to [facility], patient's nurse on 2900.  Original Report Authenticated By: Judie Petit. Ruel Favors, M.D.   Dg Chest Port 1 View  03/28/2011  *RADIOLOGY REPORT*  Clinical Data: Ventilator dependent  PORTABLE CHEST - 1 VIEW  Comparison: 03/26/2010  Findings: Endotracheal tube terminates 2.5 cm above the carina.  Stable right IJ venous catheter and enteric tube.  Mild interstitial edema with probable bilateral pleural effusions, improved.  Stable moderate cardiomegaly.  IMPRESSION: Endotracheal tube terminates 2.5 cm above the carina.  Stable moderate cardiomegaly.  Mild interstitial edema with probable bilateral pleural effusions, improved.  Original Report Authenticated By: Charline Bills, M.D.     No diagnosis found.    MDM  20yF with cardiac arrest likely 2/2 respiratory arrest. Intubated pre-hospital and airway confirmed. Discussed with CCM and hypothermic protocol initiated. Empiric broad spectrum abx. Cardiomegaly on CXR and cards consulted per ccm request.  ICU admit.        Raeford Razor, MD 03/29/11 1043

## 2011-03-29 NOTE — ED Provider Notes (Signed)
I saw and evaluated the patient, reviewed the resident's note and I agree with the findings and plan.  Please see completed note for this visit.  Raeford Razor, MD 03/29/11 1046

## 2011-03-29 NOTE — Progress Notes (Signed)
Subjective: Patient remains intubated and unresponsive.  On Dilantin and Propofol.  No further seizure activity noted.    Objective: Current vital signs: BP 119/78  Pulse 100  Temp(Src) 99.1 F (37.3 C) (Oral)  Resp 28  Ht 5\' 2"  (1.575 m)  Wt 72.3 kg (159 lb 6.3 oz)  BMI 29.15 kg/m2  SpO2 97%  LMP 03/02/2011 Vital signs in last 24 hours: Temp:  [97.5 F (36.4 C)-99.1 F (37.3 C)] 99.1 F (37.3 C) (01/06 1200) Pulse Rate:  [77-101] 100  (01/06 1400) Resp:  [16-28] 28  (01/06 1400) BP: (76-131)/(49-94) 119/78 mmHg (01/06 1400) SpO2:  [97 %-100 %] 97 % (01/06 1400) Arterial Line BP: (74-158)/(46-97) 140/81 mmHg (01/06 1400) FiO2 (%):  [29.6 %-30.3 %] 29.9 % (01/06 1400) Weight:  [72.3 kg (159 lb 6.3 oz)] 159 lb 6.3 oz (72.3 kg) (01/06 0330)  Intake/Output from previous day: 01/05 0701 - 01/06 0700 In: 4036.4 [I.V.:2254.4; Blood:1060; NG/GT:310; IV Piggyback:382] Out: 405 [Urine:405] Intake/Output this shift: Total I/O In: 290.9 [I.V.:158.9; NG/GT:130; IV Piggyback:2] Out: 125 [Urine:125] Nutritional status:    Neurologic Exam: Mental Status:  Patient does not respond to verbal stimuli. Does not respond to deep sternal rub. Does not follow commands. No verbalizations noted.  Cranial Nerves:  II: patient does not respond confrontation bilaterally, pupils right 4 mm, left 4 mm,and reactive bilaterally  III,IV,VI: doll's response present bilaterally.  V,VII: corneal reflex present bilaterally  VIII: patient does not respond to verbal stimuli  IX,X: gag reflex unable to test, XI: trapezius strength unable to test bilaterally  XII: tongue strength unable to test  Motor:  Extremities flaccid throughout.  No purposeful or spontaneous movements noted.  Sensory:  Does not respond to noxious stimuli in any extremity.  Deep Tendon Reflexes:  1+ throughout.  Plantars:  mute bilaterally  Cerebellar:  Unable to perform   Lab Results: Results for orders placed during the  hospital encounter of 03/26/11 (from the past 48 hour(s))  GLUCOSE, CAPILLARY     Status: Normal   Collection Time   03/27/11  2:39 PM      Component Value Range Comment   Glucose-Capillary 79  70 - 99 (mg/dL)   BASIC METABOLIC PANEL     Status: Abnormal   Collection Time   03/27/11  2:40 PM      Component Value Range Comment   Sodium 140  135 - 145 (mEq/L)    Potassium 4.2  3.5 - 5.1 (mEq/L)    Chloride 115 (*) 96 - 112 (mEq/L)    CO2 18 (*) 19 - 32 (mEq/L)    Glucose, Bld 82  70 - 99 (mg/dL)    BUN 21  6 - 23 (mg/dL)    Creatinine, Ser 4.09 (*) 0.50 - 1.10 (mg/dL)    Calcium 6.8 (*) 8.4 - 10.5 (mg/dL)    GFR calc non Af Amer 29 (*) >90 (mL/min)    GFR calc Af Amer 33 (*) >90 (mL/min)   CARDIAC PANEL(CRET KIN+CKTOT+MB+TROPI)     Status: Abnormal   Collection Time   03/27/11  2:40 PM      Component Value Range Comment   Total CK 2257 (*) 7 - 177 (U/L)    CK, MB 13.4 (*) 0.3 - 4.0 (ng/mL) CRITICAL VALUE NOTED.  VALUE IS CONSISTENT WITH PREVIOUSLY REPORTED AND CALLED VALUE.   Troponin I <0.30  <0.30 (ng/mL)    Relative Index 0.6  0.0 - 2.5    HEMOGLOBIN AND HEMATOCRIT, BLOOD  Status: Abnormal   Collection Time   03/27/11  3:00 PM      Component Value Range Comment   Hemoglobin 7.4 (*) 12.0 - 15.0 (g/dL)    HCT 16.1 (*) 09.6 - 46.0 (%)   GLUCOSE, CAPILLARY     Status: Normal   Collection Time   03/27/11  3:56 PM      Component Value Range Comment   Glucose-Capillary 77  70 - 99 (mg/dL)   HEMOGLOBIN AND HEMATOCRIT, BLOOD     Status: Abnormal   Collection Time   03/27/11  6:00 PM      Component Value Range Comment   Hemoglobin 7.5 (*) 12.0 - 15.0 (g/dL)    HCT 04.5 (*) 40.9 - 46.0 (%)   BASIC METABOLIC PANEL     Status: Abnormal   Collection Time   03/27/11  6:00 PM      Component Value Range Comment   Sodium 141  135 - 145 (mEq/L)    Potassium 4.3  3.5 - 5.1 (mEq/L)    Chloride 116 (*) 96 - 112 (mEq/L)    CO2 18 (*) 19 - 32 (mEq/L)    Glucose, Bld 84  70 - 99 (mg/dL)    BUN 21   6 - 23 (mg/dL)    Creatinine, Ser 8.11 (*) 0.50 - 1.10 (mg/dL)    Calcium 7.0 (*) 8.4 - 10.5 (mg/dL)    GFR calc non Af Amer 27 (*) >90 (mL/min)    GFR calc Af Amer 31 (*) >90 (mL/min)   GLUCOSE, CAPILLARY     Status: Normal   Collection Time   03/27/11  6:18 PM      Component Value Range Comment   Glucose-Capillary 70  70 - 99 (mg/dL)   GLUCOSE, CAPILLARY     Status: Normal   Collection Time   03/27/11  7:52 PM      Component Value Range Comment   Glucose-Capillary 91  70 - 99 (mg/dL)   GLUCOSE, CAPILLARY     Status: Normal   Collection Time   03/27/11  9:56 PM      Component Value Range Comment   Glucose-Capillary 83  70 - 99 (mg/dL)   BASIC METABOLIC PANEL     Status: Abnormal   Collection Time   03/27/11 10:00 PM      Component Value Range Comment   Sodium 140  135 - 145 (mEq/L)    Potassium 4.3  3.5 - 5.1 (mEq/L)    Chloride 115 (*) 96 - 112 (mEq/L)    CO2 18 (*) 19 - 32 (mEq/L)    Glucose, Bld 85  70 - 99 (mg/dL)    BUN 22  6 - 23 (mg/dL)    Creatinine, Ser 9.14 (*) 0.50 - 1.10 (mg/dL)    Calcium 7.0 (*) 8.4 - 10.5 (mg/dL)    GFR calc non Af Amer 24 (*) >90 (mL/min)    GFR calc Af Amer 28 (*) >90 (mL/min)   CARDIAC PANEL(CRET KIN+CKTOT+MB+TROPI)     Status: Abnormal   Collection Time   03/27/11 10:00 PM      Component Value Range Comment   Total CK 1822 (*) 7 - 177 (U/L)    CK, MB 12.1 (*) 0.3 - 4.0 (ng/mL) CRITICAL VALUE NOTED.  VALUE IS CONSISTENT WITH PREVIOUSLY REPORTED AND CALLED VALUE.   Troponin I <0.30  <0.30 (ng/mL)    Relative Index 0.7  0.0 - 2.5    GLUCOSE, CAPILLARY  Status: Normal   Collection Time   03/27/11 11:48 PM      Component Value Range Comment   Glucose-Capillary 77  70 - 99 (mg/dL)   HEMOGLOBIN AND HEMATOCRIT, BLOOD     Status: Abnormal   Collection Time   03/27/11 11:55 PM      Component Value Range Comment   Hemoglobin 7.1 (*) 12.0 - 15.0 (g/dL)    HCT 91.4 (*) 78.2 - 46.0 (%)   BASIC METABOLIC PANEL     Status: Abnormal   Collection Time    03/28/11 12:54 AM      Component Value Range Comment   Sodium 140  135 - 145 (mEq/L)    Potassium 4.2  3.5 - 5.1 (mEq/L)    Chloride 115 (*) 96 - 112 (mEq/L)    CO2 17 (*) 19 - 32 (mEq/L)    Glucose, Bld 84  70 - 99 (mg/dL)    BUN 21  6 - 23 (mg/dL)    Creatinine, Ser 9.56 (*) 0.50 - 1.10 (mg/dL)    Calcium 7.0 (*) 8.4 - 10.5 (mg/dL)    GFR calc non Af Amer 24 (*) >90 (mL/min)    GFR calc Af Amer 28 (*) >90 (mL/min)   LACTIC ACID, PLASMA     Status: Normal   Collection Time   03/28/11 12:54 AM      Component Value Range Comment   Lactic Acid, Venous 0.7  0.5 - 2.2 (mmol/L)   GLUCOSE, CAPILLARY     Status: Normal   Collection Time   03/28/11  4:10 AM      Component Value Range Comment   Glucose-Capillary 78  70 - 99 (mg/dL)   LACTIC ACID, PLASMA     Status: Normal   Collection Time   03/28/11  5:00 AM      Component Value Range Comment   Lactic Acid, Venous 0.7  0.5 - 2.2 (mmol/L)   BASIC METABOLIC PANEL     Status: Abnormal   Collection Time   03/28/11  6:00 AM      Component Value Range Comment   Sodium 140  135 - 145 (mEq/L)    Potassium 4.2  3.5 - 5.1 (mEq/L)    Chloride 115 (*) 96 - 112 (mEq/L)    CO2 16 (*) 19 - 32 (mEq/L)    Glucose, Bld 81  70 - 99 (mg/dL)    BUN 22  6 - 23 (mg/dL)    Creatinine, Ser 2.13 (*) 0.50 - 1.10 (mg/dL)    Calcium 6.9 (*) 8.4 - 10.5 (mg/dL)    GFR calc non Af Amer 22 (*) >90 (mL/min)    GFR calc Af Amer 25 (*) >90 (mL/min)   GLUCOSE, CAPILLARY     Status: Normal   Collection Time   03/28/11  7:44 AM      Component Value Range Comment   Glucose-Capillary 71  70 - 99 (mg/dL)   LACTIC ACID, PLASMA     Status: Normal   Collection Time   03/28/11  9:00 AM      Component Value Range Comment   Lactic Acid, Venous 0.6  0.5 - 2.2 (mmol/L)   BASIC METABOLIC PANEL     Status: Abnormal   Collection Time   03/28/11  9:50 AM      Component Value Range Comment   Sodium 138  135 - 145 (mEq/L)    Potassium 4.4  3.5 - 5.1 (mEq/L)    Chloride 113 (*) 96 -  112  (mEq/L)    CO2 17 (*) 19 - 32 (mEq/L)    Glucose, Bld 78  70 - 99 (mg/dL)    BUN 22  6 - 23 (mg/dL)    Creatinine, Ser 1.61 (*) 0.50 - 1.10 (mg/dL)    Calcium 6.9 (*) 8.4 - 10.5 (mg/dL)    GFR calc non Af Amer 20 (*) >90 (mL/min)    GFR calc Af Amer 23 (*) >90 (mL/min)   GLUCOSE, CAPILLARY     Status: Abnormal   Collection Time   03/28/11 10:27 AM      Component Value Range Comment   Glucose-Capillary 69 (*) 70 - 99 (mg/dL)   GLUCOSE, CAPILLARY     Status: Abnormal   Collection Time   03/28/11 11:13 AM      Component Value Range Comment   Glucose-Capillary 104 (*) 70 - 99 (mg/dL)   BASIC METABOLIC PANEL     Status: Abnormal   Collection Time   03/28/11  1:50 PM      Component Value Range Comment   Sodium 137  135 - 145 (mEq/L)    Potassium 4.4  3.5 - 5.1 (mEq/L)    Chloride 112  96 - 112 (mEq/L)    CO2 17 (*) 19 - 32 (mEq/L)    Glucose, Bld 86  70 - 99 (mg/dL)    BUN 23  6 - 23 (mg/dL)    Creatinine, Ser 0.96 (*) 0.50 - 1.10 (mg/dL)    Calcium 6.9 (*) 8.4 - 10.5 (mg/dL)    GFR calc non Af Amer 18 (*) >90 (mL/min)    GFR calc Af Amer 21 (*) >90 (mL/min)   CBC     Status: Abnormal   Collection Time   03/28/11  3:45 PM      Component Value Range Comment   WBC 10.2  4.0 - 10.5 (K/uL)    RBC 2.19 (*) 3.87 - 5.11 (MIL/uL)    Hemoglobin 6.6 (*) 12.0 - 15.0 (g/dL)    HCT 04.5 (*) 40.9 - 46.0 (%)    MCV 87.7  78.0 - 100.0 (fL)    MCH 30.1  26.0 - 34.0 (pg)    MCHC 34.4  30.0 - 36.0 (g/dL)    RDW 81.1  91.4 - 78.2 (%)    Platelets 262  150 - 400 (K/uL)   DIFFERENTIAL     Status: Normal   Collection Time   03/28/11  3:45 PM      Component Value Range Comment   Neutrophils Relative 69  43 - 77 (%)    Neutro Abs 7.0  1.7 - 7.7 (K/uL)    Lymphocytes Relative 20  12 - 46 (%)    Lymphs Abs 2.1  0.7 - 4.0 (K/uL)    Monocytes Relative 7  3 - 12 (%)    Monocytes Absolute 0.7  0.1 - 1.0 (K/uL)    Eosinophils Relative 3  0 - 5 (%)    Eosinophils Absolute 0.3  0.0 - 0.7 (K/uL)    Basophils  Relative 1  0 - 1 (%)    Basophils Absolute 0.1  0.0 - 0.1 (K/uL)   POCT I-STAT 3, BLOOD GAS (G3+)     Status: Abnormal   Collection Time   03/28/11  3:54 PM      Component Value Range Comment   pH, Arterial 7.409 (*) 7.350 - 7.400     pCO2 arterial 24.7 (*) 35.0 - 45.0 (mmHg)    pO2, Arterial  106.0 (*) 80.0 - 100.0 (mmHg)    Bicarbonate 15.6 (*) 20.0 - 24.0 (mEq/L)    TCO2 16  0 - 100 (mmol/L)    O2 Saturation 98.0      Acid-base deficit 8.0 (*) 0.0 - 2.0 (mmol/L)    Patient temperature 98.6 F      Collection site RADIAL, ALLEN'S TEST ACCEPTABLE      Sample type ARTERIAL     GLUCOSE, CAPILLARY     Status: Normal   Collection Time   03/28/11  4:05 PM      Component Value Range Comment   Glucose-Capillary 91  70 - 99 (mg/dL)   HEPATIC FUNCTION PANEL     Status: Abnormal   Collection Time   03/28/11  4:48 PM      Component Value Range Comment   Total Protein 3.8 (*) 6.0 - 8.3 (g/dL)    Albumin 1.3 (*) 3.5 - 5.2 (g/dL)    AST 40 (*) 0 - 37 (U/L)    ALT 16  0 - 35 (U/L)    Alkaline Phosphatase 50  39 - 117 (U/L)    Total Bilirubin 0.1 (*) 0.3 - 1.2 (mg/dL)    Bilirubin, Direct <1.6  0.0 - 0.3 (mg/dL)    Indirect Bilirubin NOT CALCULATED  0.3 - 0.9 (mg/dL)   LACTATE DEHYDROGENASE     Status: Abnormal   Collection Time   03/28/11  5:30 PM      Component Value Range Comment   LD 282 (*) 94 - 250 (U/L)   RENAL FUNCTION PANEL     Status: Abnormal   Collection Time   03/28/11  5:30 PM      Component Value Range Comment   Sodium 137  135 - 145 (mEq/L)    Potassium 4.5  3.5 - 5.1 (mEq/L)    Chloride 112  96 - 112 (mEq/L)    CO2 16 (*) 19 - 32 (mEq/L)    Glucose, Bld 107 (*) 70 - 99 (mg/dL)    BUN 22  6 - 23 (mg/dL)    Creatinine, Ser 1.09 (*) 0.50 - 1.10 (mg/dL)    Calcium 6.9 (*) 8.4 - 10.5 (mg/dL)    Phosphorus 5.7 (*) 2.3 - 4.6 (mg/dL)    Albumin 1.3 (*) 3.5 - 5.2 (g/dL)    GFR calc non Af Amer 17 (*) >90 (mL/min)    GFR calc Af Amer 20 (*) >90 (mL/min)   MAGNESIUM     Status:  Normal   Collection Time   03/28/11  5:30 PM      Component Value Range Comment   Magnesium 1.6  1.5 - 2.5 (mg/dL)   URIC ACID     Status: Abnormal   Collection Time   03/28/11  5:30 PM      Component Value Range Comment   Uric Acid, Serum 7.1 (*) 2.4 - 7.0 (mg/dL)   CORTISOL     Status: Normal   Collection Time   03/28/11  5:30 PM      Component Value Range Comment   Cortisol, Plasma 19.8     HEPATITIS PANEL, ACUTE     Status: Normal (Preliminary result)   Collection Time   03/28/11  5:30 PM      Component Value Range Comment   Hepatitis B Surface Ag NEGATIVE  NEGATIVE     HCV Ab NEGATIVE  NEGATIVE     Hep A IgM PENDING  NEGATIVE     Hep B C IgM PENDING  NEGATIVE    DIRECT ANTIGLOBULIN TEST     Status: Normal   Collection Time   03/28/11  6:20 PM      Component Value Range Comment   DAT, complement NEG      DAT, IgG NEG     PREPARE RBC (CROSSMATCH)     Status: Normal   Collection Time   03/28/11  6:20 PM      Component Value Range Comment   Order Confirmation ORDER PROCESSED BY BLOOD BANK     GLUCOSE, CAPILLARY     Status: Abnormal   Collection Time   03/28/11  8:38 PM      Component Value Range Comment   Glucose-Capillary 114 (*) 70 - 99 (mg/dL)   PROTEIN / CREATININE RATIO, URINE     Status: Abnormal   Collection Time   03/29/11 12:55 AM      Component Value Range Comment   Creatinine, Urine 125.87      Total Protein, Urine 50   NO NORMAL RANGE ESTABLISHED FOR THIS TEST   PROTEIN CREATININE RATIO 0.40 (*) 0.00 - 0.15    PREPARE RBC (CROSSMATCH)     Status: Normal   Collection Time   03/29/11  2:52 AM      Component Value Range Comment   Order Confirmation ORDER PROCESSED BY BLOOD BANK     GLUCOSE, CAPILLARY     Status: Abnormal   Collection Time   03/29/11  3:35 AM      Component Value Range Comment   Glucose-Capillary 165 (*) 70 - 99 (mg/dL)   POCT I-STAT 3, BLOOD GAS (G3+)     Status: Abnormal   Collection Time   03/29/11  4:43 AM      Component Value Range Comment   pH,  Arterial 7.340 (*) 7.350 - 7.400     pCO2 arterial 29.3 (*) 35.0 - 45.0 (mmHg)    pO2, Arterial 89.0  80.0 - 100.0 (mmHg)    Bicarbonate 15.9 (*) 20.0 - 24.0 (mEq/L)    TCO2 17  0 - 100 (mmol/L)    O2 Saturation 97.0      Acid-base deficit 9.0 (*) 0.0 - 2.0 (mmol/L)    Patient temperature 97.9 F      Collection site ARTERIAL LINE      Drawn by RT      Sample type ARTERIAL     RENAL FUNCTION PANEL     Status: Abnormal   Collection Time   03/29/11  6:45 AM      Component Value Range Comment   Sodium 136  135 - 145 (mEq/L)    Potassium 5.6 (*) 3.5 - 5.1 (mEq/L) HEMOLYSIS AT THIS LEVEL MAY AFFECT RESULT   Chloride 107  96 - 112 (mEq/L)    CO2 15 (*) 19 - 32 (mEq/L)    Glucose, Bld 134 (*) 70 - 99 (mg/dL)    BUN 24 (*) 6 - 23 (mg/dL)    Creatinine, Ser 1.61 (*) 0.50 - 1.10 (mg/dL)    Calcium 7.5 (*) 8.4 - 10.5 (mg/dL)    Phosphorus 7.1 (*) 2.3 - 4.6 (mg/dL)    Albumin 1.6 (*) 3.5 - 5.2 (g/dL)    GFR calc non Af Amer 16 (*) >90 (mL/min)    GFR calc Af Amer 18 (*) >90 (mL/min)   CBC     Status: Abnormal   Collection Time   03/29/11  6:45 AM      Component Value Range Comment   WBC  13.4 (*) 4.0 - 10.5 (K/uL)    RBC 4.04  3.87 - 5.11 (MIL/uL)    Hemoglobin 11.7 (*) 12.0 - 15.0 (g/dL) POST TRANSFUSION SPECIMEN   HCT 34.7 (*) 36.0 - 46.0 (%)    MCV 85.9  78.0 - 100.0 (fL)    MCH 29.0  26.0 - 34.0 (pg)    MCHC 33.7  30.0 - 36.0 (g/dL)    RDW 16.1  09.6 - 04.5 (%)    Platelets 259  150 - 400 (K/uL)   MAGNESIUM     Status: Normal   Collection Time   03/29/11  6:45 AM      Component Value Range Comment   Magnesium 1.9  1.5 - 2.5 (mg/dL)   GLUCOSE, CAPILLARY     Status: Abnormal   Collection Time   03/29/11  8:04 AM      Component Value Range Comment   Glucose-Capillary 137 (*) 70 - 99 (mg/dL)   GLUCOSE, CAPILLARY     Status: Abnormal   Collection Time   03/29/11 11:58 AM      Component Value Range Comment   Glucose-Capillary 118 (*) 70 - 99 (mg/dL)     Recent Results (from the past  240 hour(s))  CULTURE, BLOOD (ROUTINE X 2)     Status: Normal (Preliminary result)   Collection Time   03/26/11  8:20 PM      Component Value Range Status Comment   Specimen Description BLOOD ARM RIGHT   Final    Special Requests BOTTLES DRAWN AEROBIC AND ANAEROBIC 10CC   Final    Setup Time [id number]   Final    Culture     Final    Value:        BLOOD CULTURE RECEIVED NO GROWTH TO DATE CULTURE WILL BE HELD FOR 5 DAYS BEFORE ISSUING A FINAL NEGATIVE REPORT   Report Status PENDING   Incomplete   CULTURE, BLOOD (ROUTINE X 2)     Status: Normal (Preliminary result)   Collection Time   03/26/11  8:27 PM      Component Value Range Status Comment   Specimen Description BLOOD CENTRAL LINE   Final    Special Requests     Final    Value: BOTTLES DRAWN AEROBIC AND ANAEROBIC AERO 8CC, ANAE 5CC   Setup Time [id number]   Final    Culture     Final    Value:        BLOOD CULTURE RECEIVED NO GROWTH TO DATE CULTURE WILL BE HELD FOR 5 DAYS BEFORE ISSUING A FINAL NEGATIVE REPORT   Report Status PENDING   Incomplete   MRSA PCR SCREENING     Status: Normal   Collection Time   03/26/11 11:33 PM      Component Value Range Status Comment   MRSA by PCR NEGATIVE  NEGATIVE  Final   CULTURE, RESPIRATORY     Status: Normal   Collection Time   03/27/11  1:11 AM      Component Value Range Status Comment   Specimen Description ENDOTRACHEAL ASPIRATE   Final    Special Requests NONE   Final    Gram Stain     Final    Value: FEW WBC PRESENT, PREDOMINANTLY PMN     FEW SQUAMOUS EPITHELIAL CELLS PRESENT     RARE GRAM POSITIVE COCCI     IN PAIRS   Culture Non-Pathogenic Oropharyngeal-type Flora Isolated.   Final    Report Status 03/29/2011 FINAL  Final     Lipid Panel  Basename 03/26/11 2300  CHOL 296*  TRIG 146  HDL 54  CHOLHDL 5.5  VLDL 29  LDLCALC 409*    Studies/Results: Ct Head Wo Contrast  03/28/2011  *RADIOLOGY REPORT*  Clinical Data: Altered mental status.  Seizures.  Recent cardiopulmonary  resuscitation.  CT HEAD WITHOUT CONTRAST  Technique:  Contiguous axial images were obtained from the base of the skull through the vertex without contrast.  Comparison: 03/26/2011.  Findings: The cerebral hemispheres and posterior fossa structures continue to have normal appearances.  Hemorrhage, mass lesion or CT evidence of acute infarction.  The ventricles remain normal in size and position.  Interval mucosal thickening and probable retained secretions in the posterior sphenoid sinus on the left.  IMPRESSION:  Interval left sphenoid sinusitis.  Otherwise, unremarkable examination.  Original Report Authenticated By: Darrol Angel, M.D.   US Renal  03/29/2011  *RADIOLOGY REPORT*  Clinical Data: Acute on chronic renal failure  RENAL/URINARY TRACT ULTRASOUND COMPLETE  Comparison:  None.  Findings:  Right Kidney:  Measures 11.9 cm.  Echogenic renal parenchyma, likely reflecting medical renal disease.  No hydronephrosis.  Left Kidney:  Measures 12.6 cm.  Echogenic renal parenchyma, likely reflecting medical renal disease.  No hydronephrosis.  Bladder:  Decompressed by indwelling Foley catheter.  IMPRESSION: No hydronephrosis.  Echogenic renal parenchyma, likely reflecting medical renal disease.  Original Report Authenticated By: Charline Bills, M.D.   Dg Chest Port 1 View  03/29/2011  *RADIOLOGY REPORT*  Clinical Data: Antegrade, pneumonia  PORTABLE CHEST - 1 VIEW  Comparison: 03/28/2011  Findings: Endotracheal tube at the carina directed to the right main stem.  This should be retracted 3 cm.  NG tube in the stomach. Right IJ central line in the SVC region.  Heart remains enlarged with dense left lower lobe consolidation/collapse.  No significant interval change.  No pneumothorax.  IMPRESSION: Low endotracheal tube at the carina directed to the right main stem should be retracted 3 cm.  Otherwise stable chest exam.  Findings called to [facility], patient's nurse on 2900.  Original Report Authenticated By: Judie Petit. Ruel Favors, M.D.   Dg Chest Port 1 View  03/28/2011  *RADIOLOGY REPORT*  Clinical Data: Ventilator dependent  PORTABLE CHEST - 1 VIEW  Comparison: 03/26/2010  Findings: Endotracheal tube terminates 2.5 cm above the carina.  Stable right IJ venous catheter and enteric tube.  Mild interstitial edema with probable bilateral pleural effusions, improved.  Stable moderate cardiomegaly.  IMPRESSION: Endotracheal tube terminates 2.5 cm above the carina.  Stable moderate cardiomegaly.  Mild interstitial edema with probable bilateral pleural effusions, improved.  Original Report Authenticated By: Charline Bills, M.D.    Medications:  I have reviewed the patient's current medications. Scheduled:   . antiseptic oral rinse  15 mL Mouth Rinse QID  . artificial tears  1 application Both Eyes Q8H  . cefTRIAXone (ROCEPHIN) IVPB 1 gram/50 mL D5W  1 g Intravenous Q24H  . chlorhexidine  15 mL Mouth Rinse BID  . famotidine (PEPCID) IV  20 mg Intravenous QHS  . furosemide  80 mg Intravenous Once  . furosemide  80 mg Intravenous Once  . heparin  5,000 Units Subcutaneous Q8H  . influenza  inactive virus vaccine  0.5 mL Intramuscular Tomorrow-1000  . insulin aspart  0-15 Units Subcutaneous Q4H  . methylPREDNISolone (SOLU-MEDROL) injection  500 mg Intravenous Daily  . phenytoin (DILANTIN) IV  1,000 mg Intravenous Once  . phenytoin (DILANTIN) IV  100 mg Intravenous  Q8H  . sodium chloride  3 mL Intravenous Q12H  . sodium polystyrene  30 g Oral Once   Continuous:   . sodium chloride 10 mL/hr at 03/28/11 2059  . sodium chloride 10 mL/hr at 03/29/11 1400  . feeding supplement (NEPRO CARB STEADY) 1,000 mL (03/29/11 1304)  . norepinephrine (LEVOPHED) Adult infusion Stopped (03/29/11 0545)  . propofol 40 mcg/kg/min (03/29/11 1430)  . DISCONTD: feeding supplement (OSMOLITE 1.2 CAL) 1,000 mL (03/28/11 1628)    Assessment/Plan:  Patient Active Hospital Problem List: Seizure (03/28/2011)   Assessment: Patient with the  onset of seizure activity with rewarming from arctic sun protocol after PEA arrest. Patient now on Propofol and Dilantin with clinical control of seizure activity.  Repeat head CT performed and shows no evidence of anoxic brain injury at this time.    Plan: 1. Continue Propofol and Dilantin.  Will perform EEG in AM.  Based on results will determine whether can start taper of Propofol or continued use is necessary  2. Once stable enough, MRI of the brain  *Case discussed with father and Dr. Sung Amabile    LOS: 3 days   Thana Farr, MD [facility] [phone] 03/29/2011  2:38 PM

## 2011-03-29 NOTE — Progress Notes (Signed)
eLink Physician-Brief Progress Note Patient Name: Heather Sims DOB: 04-Nov-1990 MRN: [id number]  Date of Service  03/29/2011   HPI/Events of Note   Hyperglycemia with blood sugar 165  eICU Interventions  Placed on q4 hour SSI regimen   Intervention Category Intermediate Interventions: Hyperglycemia - evaluation and treatment  DETERDING,ELIZABETH 03/29/2011, 4:41 AM

## 2011-03-29 NOTE — Progress Notes (Signed)
Subjective: On vent.  Seizures under better control on precedex.  Made some urine after IV lasix last night, but not much.  CXR gradually improving.    Objective Vital signs in last 24 hours: Filed Vitals:   03/29/11 1100 03/29/11 1200 03/29/11 1300 03/29/11 1400  BP: 116/74 119/81 130/91 119/78  Pulse: 94 99 101 100  Temp:  99.1 F (37.3 C)    TempSrc:  Oral    Resp: 27 27 25 28   Height:      Weight:      SpO2: 100% 99% 99% 97%   Weight change: 1.9 kg (4 lb 3 oz)  Intake/Output Summary (Last 24 hours) at 03/29/11 1547 Last data filed at 03/29/11 1430  Gross per 24 hour  Intake 3417.87 ml  Output    490 ml  Net 2927.87 ml   Labs: Basic Metabolic Panel:  Lab 03/29/11 1610 03/28/11 1730 03/28/11 1350 03/28/11 0950 03/28/11 0600 03/28/11 0054 03/27/11 2200  NA 136 137 137 138 140 140 140  K 5.6* 4.5 4.4 4.4 4.2 4.2 4.3  CL 107 112 112 113* 115* 115* 115*  CO2 15* 16* 17* 17* 16* 17* 18*  GLUCOSE 134* 107* 86 78 81 84 85  BUN 24* 22 23 22 22 21 22   CREATININE 3.81* 3.54* 3.39* 3.18* 2.93* 2.69* 2.69*  ALB -- -- -- -- -- -- --  CALCIUM 7.5* 6.9* 6.9* 6.9* 6.9* 7.0* 7.0*  PHOS 7.1* 5.7* -- -- -- -- --   Liver Function Tests:  Lab 03/29/11 0645 03/28/11 1730 03/28/11 1648 03/26/11 2131 03/26/11 2022  AST -- -- 40* 53* 47*  ALT -- -- 16 21 21   ALKPHOS -- -- 50 66 77  BILITOT -- -- 0.1* 0.2* 0.2*  PROT -- -- 3.8* 4.5* 5.4*  ALBUMIN 1.6* 1.3* 1.3* -- --    Lab 03/26/11 2131  LIPASE 29  AMYLASE 74   No results found for this basename: AMMONIA:3 in the last 168 hours CBC:  Lab 03/29/11 0645 03/28/11 1545 03/27/11 2355 03/27/11 1800 03/27/11 0556 03/26/11 2300  WBC 13.4* 10.2 -- -- 11.8* 14.8*  NEUTROABS -- 7.0 -- -- -- --  HGB 11.7* 6.6* 7.1* 7.5* -- --  HCT 34.7* 19.2* 20.8* 21.8* -- --  MCV 85.9 87.7 -- -- 86.8 88.2  PLT 259 262 -- -- 217 253   PT/INR: @labrcntip (inr:5) Cardiac Enzymes:  Lab 03/27/11 2200 03/27/11 1440 03/27/11 0800 03/26/11 2300  CKTOTAL  1822* 2257* 2318* 2085*  CKMB 12.1* 13.4* 13.6* 14.5*  CKMBINDEX -- -- -- --  TROPONINI <0.30 <0.30 <0.30 0.35*   CBG:  Lab 03/29/11 1158 03/29/11 0804 03/29/11 0335 03/28/11 2038 03/28/11 1605  GLUCAP 118* 137* 165* 114* 91    Iron Studies: No results found for this basename: IRON:30,TIBC:30,TRANSFERRIN:30,FERRITIN:30 in the last 168 hours Studies/Results: Ct Head Wo Contrast  03/28/2011  *RADIOLOGY REPORT*  Clinical Data: Altered mental status.  Seizures.  Recent cardiopulmonary resuscitation.  CT HEAD WITHOUT CONTRAST  Technique:  Contiguous axial images were obtained from the base of the skull through the vertex without contrast.  Comparison: 03/26/2011.  Findings: The cerebral hemispheres and posterior fossa structures continue to have normal appearances.  Hemorrhage, mass lesion or CT evidence of acute infarction.  The ventricles remain normal in size and position.  Interval mucosal thickening and probable retained secretions in the posterior sphenoid sinus on the left.  IMPRESSION:  Interval left sphenoid sinusitis.  Otherwise, unremarkable examination.  Original Report Authenticated By: Londell Moh  Azucena Kuba, M.D.   US Renal  03/29/2011  *RADIOLOGY REPORT*  Clinical Data: Acute on chronic renal failure  RENAL/URINARY TRACT ULTRASOUND COMPLETE  Comparison:  None.  Findings:  Right Kidney:  Measures 11.9 cm.  Echogenic renal parenchyma, likely reflecting medical renal disease.  No hydronephrosis.  Left Kidney:  Measures 12.6 cm.  Echogenic renal parenchyma, likely reflecting medical renal disease.  No hydronephrosis.  Bladder:  Decompressed by indwelling Foley catheter.  IMPRESSION: No hydronephrosis.  Echogenic renal parenchyma, likely reflecting medical renal disease.  Original Report Authenticated By: Charline Bills, M.D.   Dg Chest Port 1 View  03/29/2011  *RADIOLOGY REPORT*  Clinical Data: Antegrade, pneumonia  PORTABLE CHEST - 1 VIEW  Comparison: 03/28/2011  Findings: Endotracheal tube at the  carina directed to the right main stem.  This should be retracted 3 cm.  NG tube in the stomach. Right IJ central line in the SVC region.  Heart remains enlarged with dense left lower lobe consolidation/collapse.  No significant interval change.  No pneumothorax.  IMPRESSION: Low endotracheal tube at the carina directed to the right main stem should be retracted 3 cm.  Otherwise stable chest exam.  Findings called to [facility], patient's nurse on 2900.  Original Report Authenticated By: Judie Petit. Ruel Favors, M.D.   Dg Chest Port 1 View  03/28/2011  *RADIOLOGY REPORT*  Clinical Data: Ventilator dependent  PORTABLE CHEST - 1 VIEW  Comparison: 03/26/2010  Findings: Endotracheal tube terminates 2.5 cm above the carina.  Stable right IJ venous catheter and enteric tube.  Mild interstitial edema with probable bilateral pleural effusions, improved.  Stable moderate cardiomegaly.  IMPRESSION: Endotracheal tube terminates 2.5 cm above the carina.  Stable moderate cardiomegaly.  Mild interstitial edema with probable bilateral pleural effusions, improved.  Original Report Authenticated By: Charline Bills, M.D.   Medications:    . sodium chloride 10 mL/hr at 03/28/11 2059  . sodium chloride 10 mL/hr at 03/29/11 1400  . feeding supplement (NEPRO CARB STEADY) 1,000 mL (03/29/11 1518)  . norepinephrine (LEVOPHED) Adult infusion Stopped (03/29/11 0545)  . propofol 40 mcg/kg/min (03/29/11 1430)  . DISCONTD: feeding supplement (OSMOLITE 1.2 CAL) 1,000 mL (03/28/11 1628)      . antiseptic oral rinse  15 mL Mouth Rinse QID  . artificial tears  1 application Both Eyes Q8H  . cefTRIAXone (ROCEPHIN) IVPB 1 gram/50 mL D5W  1 g Intravenous Q24H  . chlorhexidine  15 mL Mouth Rinse BID  . famotidine (PEPCID) IV  20 mg Intravenous QHS  . furosemide  80 mg Intravenous Once  . furosemide  80 mg Intravenous Once  . heparin  5,000 Units Subcutaneous Q8H  . influenza  inactive virus vaccine  0.5 mL Intramuscular Tomorrow-1000  .  insulin aspart  0-15 Units Subcutaneous Q4H  . methylPREDNISolone (SOLU-MEDROL) injection  500 mg Intravenous Daily  . phenytoin (DILANTIN) IV  100 mg Intravenous Q8H  . sodium chloride  3 mL Intravenous Q12H  . sodium polystyrene  30 g Oral Once    I  have reviewed scheduled and prn medications.  Physical Exam: General: on vent Heart: reg no rub Lungs: cleat ant bilat Abdomen: soft mild ascites prob Extremities: 2+ diffuse edema Neuro: sedated on vent  Problem/Plan: 21 year old female with cardiac arrest due to respiratory arrest, who has significant cardiomyopathy and HTN, with worsening renal failure.Heather KitchenMarland Sims  1) Renal Failure- creat 2.8 to 3.8 in hospital, poor UOP, proteinuria with microhematuria, very low albumin and anemia.  Suggestive of systemic disease, autoimmune  top of the list.  Serologic work-up pending, giving bolus steroids. No thrombocytopenia to suggest TTP-HUS. Start lasix IV, she seemed to respond last night after 80 mg IV.  No indication for RRT yet.   2) Pulm - had white-out pulm infiltrates on presentation which is improving without diuresis or dialysis.  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  BP dec'd prob secondary to meds, and was on pressors briefly, but now off pressors and BP coming up.   CXR improving.  Follow.    3) Neuro- seizure activity- concerning for anoxic brain injury, now on dilantin and ativan as needed.   4) Pulm- Antibiotics, ARDS protocol and vent settings per CCM  5) Heme- hemoglobin dropping, no evidence of acute bleed, may be due to critical illness. Ordered Coomb's and hemolysis workup. Transfused yest 3 units PRBC's.  No thrombocytopenia.  6)  CV-  Low EF in 35% range on ECHO, no focal WMA.  If BP meds needed, would use IV labetalol or nicardipine gtt as a couple suggestions. Don't shoot for aggressive BP control, given likelihood of chronic untreated HTN.   Vinson Moselle, MD [facility] [phone] pager   [phone] cell 03/29/2011, 3:47 PM

## 2011-03-29 NOTE — Progress Notes (Signed)
Renal Daily Progress Note  S:Pt intubated and sedated O:BP 107/69  Pulse 81  Temp(Src) 97.5 F (36.4 C) (Oral)  Resp 24  Ht 5\' 2"  (1.575 m)  Wt 159 lb 6.3 oz (72.3 kg)  BMI 29.15 kg/m2  SpO2 98%  LMP 03/02/2011  Intake/Output Summary (Last 24 hours) at 03/29/11 0703 Last data filed at 03/29/11 0600  Gross per 24 hour  Intake 3749.59 ml  Output    405 ml  Net 3344.59 ml   Weight change: 4 lb 3 oz (1.9 kg) ZOX:[id number] CVS:RRR, systolic murmur Resp:course mechanically ventilated breath sounds Abd:+bs, some mild distention, ? fluid WJX:BJYNW edema in distal extremities, 2+ pulses   Lab 03/28/11 1730 03/28/11 1648 03/28/11 1350 03/28/11 0950 03/28/11 0600 03/28/11 0054 03/27/11 2200 03/27/11 1800 03/26/11 2131 03/26/11 2022  NA 137 -- 137 138 140 140 140 141 -- --  K 4.5 -- 4.4 4.4 4.2 4.2 4.3 4.3 -- --  CL 112 -- 112 113* 115* 115* 115* 116* -- --  CO2 16* -- 17* 17* 16* 17* 18* 18* -- --  GLUCOSE 107* -- 86 78 81 84 85 84 -- --  BUN 22 -- 23 22 22 21 22 21  -- --  CREATININE 3.54* -- 3.39* 3.18* 2.93* 2.69* 2.69* 2.49* -- --  ALB -- -- -- -- -- -- -- -- -- --  CALCIUM 6.9* -- 6.9* 6.9* 6.9* 7.0* 7.0* 7.0* -- --  PHOS 5.7* -- -- -- -- -- -- -- -- --  AST -- 40* -- -- -- -- -- -- 53* 47*  ALT -- 16 -- -- -- -- -- -- 21 21   Liver Function Tests:  Lab 03/28/11 1730 03/28/11 1648 03/26/11 2131 03/26/11 2022  AST -- 40* 53* 47*  ALT -- 16 21 21   ALKPHOS -- 50 66 77  BILITOT -- 0.1* 0.2* 0.2*  PROT -- 3.8* 4.5* 5.4*  ALBUMIN 1.3* 1.3* 1.8* --    Lab 03/26/11 2131  LIPASE 29  AMYLASE 74   CBC:  Lab 03/28/11 1545 03/27/11 2355 03/27/11 1800 03/27/11 0556 03/26/11 2300  WBC 10.2 -- -- 11.8* 14.8*  NEUTROABS 7.0 -- -- -- --  HGB 6.6* 7.1* 7.5* -- --  HCT 19.2* 20.8* 21.8* -- --  MCV 87.7 -- -- 86.8 88.2  PLT 262 -- -- 217 253   Cardiac Enzymes:  Lab 03/27/11 2200 03/27/11 1440 03/27/11 0800 03/26/11 2300  CKTOTAL 1822* 2257* 2318* 2085*  CKMB 12.1* 13.4*  13.6* 14.5*  CKMBINDEX -- -- -- --  TROPONINI <0.30 <0.30 <0.30 0.35*   CBG:  Lab 03/29/11 0335 03/28/11 2038 03/28/11 1605 03/28/11 1113 03/28/11 1027  GLUCAP 165* 114* 91 104* 69*    Studies/Results: No New imaging  Medications:    . sodium chloride 10 mL/hr at 03/28/11 2059  . sodium chloride 10 mL/hr at 03/29/11 0600  . feeding supplement (OSMOLITE 1.2 CAL) 1,000 mL (03/28/11 1628)  . norepinephrine (LEVOPHED) Adult infusion Stopped (03/29/11 0545)  . propofol 30 mcg/kg/min (03/29/11 2956)  . DISCONTD: cisatracurium (NIMBEX) infusion Stopped (03/28/11 1102)  . DISCONTD: fentaNYL infusion INTRAVENOUS Stopped (03/28/11 1153)  . DISCONTD: labetalol (NORMODYNE) infusion 20 mg/min (03/27/11 1330)  . DISCONTD: midazolam (VERSED) infusion Stopped (03/28/11 1153)      . antiseptic oral rinse  15 mL Mouth Rinse QID  . artificial tears  1 application Both Eyes Q8H  . cefTRIAXone (ROCEPHIN) IVPB 1 gram/50 mL D5W  1 g Intravenous Q24H  . chlorhexidine  15  mL Mouth Rinse BID  . dextrose      . famotidine (PEPCID) IV  20 mg Intravenous QHS  . furosemide  80 mg Intravenous Once  . heparin  5,000 Units Subcutaneous Q8H  . influenza  inactive virus vaccine  0.5 mL Intramuscular Tomorrow-1000  . insulin aspart  0-15 Units Subcutaneous Q4H  . LORazepam      . methylPREDNISolone (SOLU-MEDROL) injection  500 mg Intravenous Daily  . phenytoin (DILANTIN) IV  1,000 mg Intravenous Once  . phenytoin (DILANTIN) IV  100 mg Intravenous Q8H  . sodium chloride  3 mL Intravenous Q12H  . DISCONTD: moxifloxacin  400 mg Intravenous Q24H  . DISCONTD: phenytoin (DILANTIN) IV  100 mg Intravenous Q8H  . DISCONTD: vancomycin  750 mg Intravenous Q24H  . DISCONTD: vancomycin  1,000 mg Intravenous Q24H      Assessment/Plan:  21 year old female with cardiac arrest due to respiratory arrest, possible PNA/ARDS, vs. Vasculitis, who has significant cardiomyopathy and HTN, with acute, worsening renal failure:    1) Renal- Acute Renal Failure- patient with improved urine output after dose of lasix.  Cr and BUN stable.  Would consider more lasix.  Awaiting Renal US and serologies, kidney disease or systemic inflammatory disease may be primary cause of respiratory arrest.  2) CV- cards following, Levophed per CCM. TEE per Cards. Lasix may help EF.  3) Neuro- Seizure activity- concerning for anoxic brain injury, now improved on dilantin and ativan as needed  4) Pulm- Antibiotics, ARDS protocol and vent settings per CCM  5) Heme- s/p transfusion after hemoglobin dropped below 7.0.  May be from critical illness as no sign of bleeding on exam.   6) Disposition- per CCM  [facility] 03/29/2011 8:31 AM

## 2011-03-29 NOTE — Progress Notes (Signed)
Subjective:  Patient remains intubated sedated and on paralytics Patient is off Levophed now remains in sinus rhythm Workup for autoimmune disease in progress per renal service As per her step father patient head rash over the face last month and seen dermatologist Objective:  Vital Signs in the last 24 hours: Temp:  [97.5 F (36.4 C)-99.1 F (37.3 C)] 99.1 F (37.3 C) (01/06 1200) Pulse Rate:  [77-99] 99  (01/06 1200) Resp:  [16-27] 27  (01/06 1200) BP: (76-131)/(49-94) 119/81 mmHg (01/06 1200) SpO2:  [97 %-100 %] 99 % (01/06 1200) Arterial Line BP: (74-158)/(46-97) 143/84 mmHg (01/06 1200) FiO2 (%):  [29.6 %-30.3 %] 29.9 % (01/06 1203) Weight:  [72.3 kg (159 lb 6.3 oz)] 159 lb 6.3 oz (72.3 kg) (01/06 0330)  Intake/Output from previous day: 01/05 0701 - 01/06 0700 In: 4036.4 [I.V.:2254.4; Blood:1060; NG/GT:310; IV Piggyback:382] Out: 405 [Urine:405] Intake/Output from this shift: Total I/O In: 200.8 [I.V.:90.8; NG/GT:110] Out: 65 [Urine:65]  Physical Exam: Neck: no carotid bruit, no JVD and supple, symmetrical, trachea midline Lungs: diminished breath sounds bibasilar and Occasional rhonchi and rales noted Heart: regular rate and rhythm, S1, S2 normal and Soft systolic murmur and S3 gallop noted Abdomen: Soft mildly distended bowel sounds present Extremities: extremities normal, atraumatic, no cyanosis or edema  Lab Results:  Basename 03/29/11 0645 03/28/11 1545  WBC 13.4* 10.2  HGB 11.7* 6.6*  PLT 259 262    Basename 03/29/11 0645 03/28/11 1730  NA 136 137  K 5.6* 4.5  CL 107 112  CO2 15* 16*  GLUCOSE 134* 107*  BUN 24* 22  CREATININE 3.81* 3.54*    Basename 03/27/11 2200 03/27/11 1440  TROPONINI <0.30 <0.30   Hepatic Function Panel  Basename 03/29/11 0645 03/28/11 1648  PROT -- 3.8*  ALBUMIN 1.6* --  AST -- 40*  ALT -- 16  ALKPHOS -- 50  BILITOT -- 0.1*  BILIDIR -- <0.1  IBILI -- NOT CALCULATED    Basename 03/26/11 2300  CHOL 296*   No  results found for this basename: PROTIME in the last 72 hours  Imaging: Ct Head Wo Contrast  03/28/2011  *RADIOLOGY REPORT*  Clinical Data: Altered mental status.  Seizures.  Recent cardiopulmonary resuscitation.  CT HEAD WITHOUT CONTRAST  Technique:  Contiguous axial images were obtained from the base of the skull through the vertex without contrast.  Comparison: 03/26/2011.  Findings: The cerebral hemispheres and posterior fossa structures continue to have normal appearances.  Hemorrhage, mass lesion or CT evidence of acute infarction.  The ventricles remain normal in size and position.  Interval mucosal thickening and probable retained secretions in the posterior sphenoid sinus on the left.  IMPRESSION:  Interval left sphenoid sinusitis.  Otherwise, unremarkable examination.  Original Report Authenticated By: Darrol Angel, M.D.   Dg Chest Port 1 View  03/29/2011  *RADIOLOGY REPORT*  Clinical Data: Antegrade, pneumonia  PORTABLE CHEST - 1 VIEW  Comparison: 03/28/2011  Findings: Endotracheal tube at the carina directed to the right main stem.  This should be retracted 3 cm.  NG tube in the stomach. Right IJ central line in the SVC region.  Heart remains enlarged with dense left lower lobe consolidation/collapse.  No significant interval change.  No pneumothorax.  IMPRESSION: Low endotracheal tube at the carina directed to the right main stem should be retracted 3 cm.  Otherwise stable chest exam.  Findings called to [facility], patient's nurse on 2900.  Original Report Authenticated By: Judie Petit. Ruel Favors, M.D.   Dg Chest  Port 1 View  03/28/2011  *RADIOLOGY REPORT*  Clinical Data: Ventilator dependent  PORTABLE CHEST - 1 VIEW  Comparison: 03/26/2010  Findings: Endotracheal tube terminates 2.5 cm above the carina.  Stable right IJ venous catheter and enteric tube.  Mild interstitial edema with probable bilateral pleural effusions, improved.  Stable moderate cardiomegaly.  IMPRESSION: Endotracheal tube terminates  2.5 cm above the carina.  Stable moderate cardiomegaly.  Mild interstitial edema with probable bilateral pleural effusions, improved.  Original Report Authenticated By: Charline Bills, M.D.    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

## 2011-03-29 NOTE — Progress Notes (Addendum)
Name: Heather Sims MRN: [id number] DOB: 1990/03/26  LOS: 3  CRITICAL CARE PROGRESS NOTE  History of Present Illness: 21 y/o female with no past medical history presented to the [facility] ED after respiratory arrest this evening.  Her mother states that she has had URI symptoms for the past month including cough and shortness of breath.  She was treated with one week of avelox for "walking pneumonia" two weeks ago.  Her symptoms did not improve so she was seen again in urgent care and given an albuterol inhaler and an antihypertensive because she had profoundly elevated blood pressure.  She continued to have orthopnea, pnd, and shortness of breath in the week prior to this admission.  On the night of admission she had the sudden onset of shortness of breath while out with a friend getting food.  He says that he called 911 and she was still talking when EMS arrived, but suddenly developed respiratory arrest after their arrival.  EMS performed CPR for PEA arrest for less than five minutes and she regained a pulse with no meds.  Upon arrival to the [facility] ED she was intubated and started on the arctic sun protocol.  She was hypertensive after arrival.  PCCM consulted for admission.  Lines / Drains: 03/26/11 ETT >> 03/26/11 R IJ CVL >> 03/26/11 R fem CVL >> 03/26/11 L radial a-line >>  Cultures / Sepsis markers: 03/26/11 blood cx x2 >> 03/26/11 sputum cx >> 03/26/11 flu >> NEG 03/26/11 strep/leg ur ag >>neg  03/26/11 - Urine tox >>NEG  03/27/11 HIV test >>NR  Antibiotics: 03/26/11 vanc >>1/5 03/26/11 ceftriaxone >> 03/26/11 moxi >>1/5 03/26/11 tamiflu >>03/27/11  Tests / Events: 03/26/11 CT Head >>No evidence of acute intracranial hemorrhage, mass lesion, or acute Infarct. 1/4 2 D echo >The cavity size was normal. Wall thickness was increased in a pattern of moderate LVH. Systolic function was moderately reduced. The estimated ejection fraction was in the range of 35% to 40%. Wall motion was normal; 1/4 TEE >> 1/4 venous doppler  bilaterally -NEG for VT  1/5 CT Head >>Interval left sphenoid sinusitis. Otherwise, unremarkable examination. 1/5 Renal consult for worsening renal failure -Dr. Melvia Heaps 1/5 Neuro consult >new onset Seizures   1/5 EEG >> 1/6 weaned off pressors , tranx 3 u PRBC   INTERVAL HX/OVERNIGHT/SUBJECTIVE   1/6 Near end re-warming-new onset seizure>neuro consult Dr. Doy Mince. Repeat CT head  With no acute changes. EEG pending.  Anuria and worsening renal failure, renal consult. Dr. Melvia Heaps -extensive autoimmune workup concerned for possible lupus nephritis.  Decreased seizures-none since Midnight 1/6 on dilantin /propofol  Weaned off pressors this am.  hgb improved with 3 u PRBC    Vital Signs:   Filed Vitals:   03/29/11 0700  BP: 107/69  Pulse: 81  Temp:   Resp: 24    Physical Examination: Gen: sedated on vent  HEENT:   OP clear, ETT in place Neck: supple without masses PULM: coarse breath sounds bilaterally CV: tachy, no mgr, no clear JVD AB: BS infrequent, soft, nontender, no hsm Ext: cool, no edema, no clubbing, no cyanosis Derm: no rash or skin breakdown Neuro: sedated on vent, seizure act  Psyche: cannot assess  Labs and Imaging:    Ct Head Wo Contrast  03/28/2011  *RADIOLOGY REPORT*  Clinical Data: Altered mental status.  Seizures.  Recent cardiopulmonary resuscitation.  CT HEAD WITHOUT CONTRAST  Technique:  Contiguous axial images were obtained from the base of the skull through the vertex without contrast.  Comparison: 03/26/2011.  Findings: The cerebral hemispheres and posterior fossa structures continue to have normal appearances.  Hemorrhage, mass lesion or CT evidence of acute infarction.  The ventricles remain normal in size and position.  Interval mucosal thickening and probable retained secretions in the posterior sphenoid sinus on the left.  IMPRESSION:  Interval left sphenoid sinusitis.  Otherwise, unremarkable examination.  Original Report Authenticated By: Gerald Stabs,  M.D.   Dg Chest Port 1 View  03/28/2011  *RADIOLOGY REPORT*  Clinical Data: Ventilator dependent  PORTABLE CHEST - 1 VIEW  Comparison: 03/26/2010  Findings: Endotracheal tube terminates 2.5 cm above the carina.  Stable right IJ venous catheter and enteric tube.  Mild interstitial edema with probable bilateral pleural effusions, improved.  Stable moderate cardiomegaly.  IMPRESSION: Endotracheal tube terminates 2.5 cm above the carina.  Stable moderate cardiomegaly.  Mild interstitial edema with probable bilateral pleural effusions, improved.  Original Report Authenticated By: Julian Hy, M.D.     CBC    Component Value Date/Time   WBC 13.4* 03/29/2011 0645   RBC 4.04 03/29/2011 0645   HGB 11.7* 03/29/2011 0645   HCT 34.7* 03/29/2011 0645   PLT 259 03/29/2011 0645   MCV 85.9 03/29/2011 0645   MCH 29.0 03/29/2011 0645   MCHC 33.7 03/29/2011 0645   RDW 15.2 03/29/2011 0645   LYMPHSABS 2.1 03/28/2011 1545   MONOABS 0.7 03/28/2011 1545   EOSABS 0.3 03/28/2011 1545   BASOSABS 0.1 03/28/2011 1545    BMET    Component Value Date/Time   NA 136 03/29/2011 0645   K 5.6* 03/29/2011 0645   CL 107 03/29/2011 0645   CO2 15* 03/29/2011 0645   GLUCOSE 134* 03/29/2011 0645   BUN 24* 03/29/2011 0645   CREATININE 3.81* 03/29/2011 0645   CALCIUM 7.5* 03/29/2011 0645   GFRNONAA 16* 03/29/2011 0645   GFRAA 18* 03/29/2011 0645   Lab Results  Component Value Date   CREATININE 3.81* 03/29/2011   BUN 24* 03/29/2011   NA 136 03/29/2011   K 5.6* 03/29/2011   CL 107 03/29/2011   CO2 15* 03/29/2011   No components found with this basename: RENAL:3]  Recent Labs  Basename 03/29/11 0645 03/28/11 1730 03/28/11 1350 03/28/11 0950   BUN 24* 22 23 22   ]  Assessment and Plan:  21 y/o female with no past medical history with witnessed arrest w/ immediate healthcare provided CPR. Prodrome of Resp symptoms x 1 month prior to admission w/ associated severe HTN, swelling. On admission with  pulmonary edema and significant cardiomegaly with  acute renal  failure , anemia  on admission 1/3       Respiratory failure (03/26/2011)   Assessment: Due to pulmonary edema on admission 1/3 +/- PNA -Asp.  1/6 : possible autoimmune disorder including anti-gbm ab , w/up in progress per renal    Plan:  -full vent support -follow cx data  -cxr today  -ABG today  -hold on Bicarb drip for now   Cardiac arrest   Assessment: unresponsive on arrival here 2D echo 1/4 shows LVH with EF 35% , TEE final pending to r/o VSD     Plan: -arctic sun protocol-rewarming completed 1/5  -cards on board    AKI (acute kidney injury) (03/26/2011)  Lab 03/29/11 0645 03/28/11 1730 03/28/11 1350 03/28/11 0950 03/28/11 0600  CREATININE 3.81* 3.54* 3.39* 3.18* 2.93*   1/6 : Appreciate renal consult, autoimmune w/up in progress. Steroid and Lasix trial Increased UOP . Renal US pending.  Assessment:     Plan:  -renal  On board  - follow autoimmune w/up -renal US pending.    Acute heart failure (03/26/2011)   Assessment: non-ischaemic, viral? Vs autoimmune  EKG without clear ischaemia   Plan:  -follow up cards recs -  -TEE final results pending.   Pneumonia (03/26/2011)   Assessment: community acquired, unclear if aspirated as part of code. Flu negative HIV neg .    Plan:    -follow cx data  -cont rocephin   Anemia (03/26/2011)   Assessment: uncertain etiology, no clear evidence of bleed Anemic on admission 1/3. ? Autoimmune process  1/6: s/p 3 u PRBC 1/5 , hbg improved s/p tranx   Lab 03/29/11 0645 03/28/11 1545 03/27/11 2355 03/27/11 0556  HGB 11.7* 6.6* 7.1* --  HCT 34.7* 19.2* 20.8* --  WBC 13.4* 10.2 -- 11.8*  PLT 259 262 -- 217      Plan:  -check hct now -prbc for hgb < 8gm% -will cont on sq hep for now as if autoimmune-higher risk for dvt/pe. Monitor hbg closely   New Onset Seizure Activity (03/28/2011)  Repeat CT head 1/5 no acute process.  EEG pending. Neuro consult 1/5  1/6: decreased seizure act on propofol and Dilantin   Plan:  -Cont  dilantin/profolol  -As needed  Ativan  -EEG pending.   Shock:  1/6: weaned off pressors this am.   -goal for  MAP >60  -monitor cvp   Family updated at bedside.-father   Best practices / Disposition: ICU status on PCCM service  Feeding/protein malnutrition: npo Analgesia:  Sedation: propofol Thromboprophylaxis: sub q hep HOB >30 degrees Ulcer prophylaxis: famotidine Glucose control/hyperglycemia: follow cbg Diet : trickle feeds 1/5      PARRETT,TAMMY NP-C    03/29/2011, 7:58 AM   Patient seen and examined and database was reviewed with ACNP Parrett. The above note reflects the plan as established on morning rounds.  Merton Border, MD;  PCCM service; Mobile [phone]

## 2011-03-30 ENCOUNTER — Inpatient Hospital Stay (HOSPITAL_COMMUNITY): Payer: Medicaid Other

## 2011-03-30 DIAGNOSIS — G9349 Other encephalopathy: Secondary | ICD-10-CM

## 2011-03-30 DIAGNOSIS — G40401 Other generalized epilepsy and epileptic syndromes, not intractable, with status epilepticus: Secondary | ICD-10-CM

## 2011-03-30 LAB — TYPE AND SCREEN
ABO/RH(D): O POS
Antibody Screen: NEGATIVE
Unit division: 0
Unit division: 0
Unit division: 0

## 2011-03-30 LAB — GLUCOSE, CAPILLARY
Glucose-Capillary: 117 mg/dL — ABNORMAL HIGH (ref 70–99)
Glucose-Capillary: 163 mg/dL — ABNORMAL HIGH (ref 70–99)

## 2011-03-30 LAB — POCT I-STAT 3, ART BLOOD GAS (G3+)
Acid-base deficit: 8 mmol/L — ABNORMAL HIGH (ref 0.0–2.0)
Bicarbonate: 17.2 mEq/L — ABNORMAL LOW (ref 20.0–24.0)
O2 Saturation: 97 %
pO2, Arterial: 98 mmHg (ref 80.0–100.0)

## 2011-03-30 LAB — RENAL FUNCTION PANEL
Albumin: 1.8 g/dL — ABNORMAL LOW (ref 3.5–5.2)
BUN: 37 mg/dL — ABNORMAL HIGH (ref 6–23)
Creatinine, Ser: 4.73 mg/dL — ABNORMAL HIGH (ref 0.50–1.10)
Phosphorus: 8.9 mg/dL — ABNORMAL HIGH (ref 2.3–4.6)

## 2011-03-30 LAB — CBC
HCT: 33.3 % — ABNORMAL LOW (ref 36.0–46.0)
MCHC: 33.6 g/dL (ref 30.0–36.0)
MCV: 86.3 fL (ref 78.0–100.0)
RDW: 16 % — ABNORMAL HIGH (ref 11.5–15.5)

## 2011-03-30 LAB — KAPPA/LAMBDA LIGHT CHAINS
Kappa, lambda light chain ratio: 1.46 (ref 0.26–1.65)
Lambda free light chains: 5.89 mg/dL — ABNORMAL HIGH (ref 0.57–2.63)

## 2011-03-30 LAB — ANTI-DNA ANTIBODY, DOUBLE-STRANDED: ds DNA Ab: 22 IU/mL (ref <30)

## 2011-03-30 MED ORDER — HYDRALAZINE HCL 20 MG/ML IJ SOLN
10.0000 mg | INTRAMUSCULAR | Status: DC | PRN
  Administered 2011-03-30: 40 mg via INTRAVENOUS
  Administered 2011-03-30 (×2): 20 mg via INTRAVENOUS
  Filled 2011-03-30 (×2): qty 1
  Filled 2011-03-30: qty 2

## 2011-03-30 MED ORDER — MIDAZOLAM HCL 2 MG/2ML IJ SOLN
1.0000 mg | INTRAMUSCULAR | Status: DC | PRN
  Administered 2011-03-30 (×2): 2 mg via INTRAVENOUS
  Filled 2011-03-30 (×2): qty 2

## 2011-03-30 MED ORDER — LABETALOL HCL 200 MG PO TABS
200.0000 mg | ORAL_TABLET | Freq: Two times a day (BID) | ORAL | Status: DC
  Administered 2011-03-30 – 2011-03-31 (×4): 200 mg
  Filled 2011-03-30 (×7): qty 1

## 2011-03-30 MED ORDER — LABETALOL HCL 5 MG/ML IV SOLN
20.0000 mg | INTRAVENOUS | Status: DC | PRN
  Administered 2011-03-30 – 2011-04-01 (×3): 20 mg via INTRAVENOUS
  Filled 2011-03-30 (×4): qty 4

## 2011-03-30 MED ORDER — INFLUENZA VIRUS VACC SPLIT PF IM SUSP
0.5000 mL | INTRAMUSCULAR | Status: AC
  Administered 2011-03-31: 0.5 mL via INTRAMUSCULAR
  Filled 2011-03-30: qty 0.5

## 2011-03-30 NOTE — Progress Notes (Signed)
eLink Physician-Brief Progress Note Patient Name: Heather Sims DOB: 1990-04-02 MRN: [id number]  Date of Service  03/30/2011   HPI/Events of Note   High BP  eICU Interventions  labetolol IV with parameters prn   Intervention Category Intermediate Interventions: Hypertension - evaluation and management  Stephan Nelis V. 03/30/2011, 5:29 PM

## 2011-03-30 NOTE — Progress Notes (Signed)
Subjective:  Remains intubated unresponsive off propofol now Renal function continues to worsen Patient is off pressors now blood pressure stable  Objective:  Vital Signs in the last 24 hours: Temp:  [98.4 F (36.9 C)-99.8 F (37.7 C)] 99 F (37.2 C) (01/07 1200) Pulse Rate:  [92-108] 93  (01/07 1200) Resp:  [24-31] 26  (01/07 1200) BP: (109-150)/(60-98) 114/67 mmHg (01/07 1200) SpO2:  [96 %-100 %] 98 % (01/07 1200) Arterial Line BP: (117-201)/(71-111) 142/80 mmHg (01/07 1200) FiO2 (%):  [29.7 %-30.3 %] 30 % (01/07 1200) Weight:  [72.5 kg (159 lb 13.3 oz)] 159 lb 13.3 oz (72.5 kg) (01/07 0400)  Intake/Output from previous day: 01/06 0701 - 01/07 0700 In: 1121.2 [I.V.:597.2; NG/GT:470; IV Piggyback:54] Out: 285 [Urine:285] Intake/Output from this shift: Total I/O In: 252.9 [I.V.:102.9; Other:50; NG/GT:100] Out: 75 [Urine:75]  Physical Exam: Neck: no JVD and supple, symmetrical, trachea midline Lungs: Decreased breath sounds at bases with occasional rhonchi Heart: regular rate and rhythm, S1, S2 normal and Soft systolic murmur noted Abdomen: Soft bowel sounds present mildly distended Extremities: No clubbing cyanosis 1+ edema  Lab Results:  Basename 03/30/11 0415 03/29/11 0645  WBC 26.3* 13.4*  HGB 11.2* 11.7*  PLT 293 259    Basename 03/30/11 0415 03/29/11 0645  NA 137 136  K 4.6 5.6*  CL 108 107  CO2 16* 15*  GLUCOSE 146* 134*  BUN 37* 24*  CREATININE 4.73* 3.81*    Basename 03/27/11 2200 03/27/11 1440  TROPONINI <0.30 <0.30   Hepatic Function Panel  Basename 03/30/11 0415 03/28/11 1648  PROT -- 3.8*  ALBUMIN 1.8* --  AST -- 40*  ALT -- 16  ALKPHOS -- 50  BILITOT -- 0.1*  BILIDIR -- <0.1  IBILI -- NOT CALCULATED   No results found for this basename: CHOL in the last 72 hours No results found for this basename: PROTIME in the last 72 hours  Imaging: Ct Head Wo Contrast  03/28/2011  *RADIOLOGY REPORT*  Clinical Data: Altered mental status.   Seizures.  Recent cardiopulmonary resuscitation.  CT HEAD WITHOUT CONTRAST  Technique:  Contiguous axial images were obtained from the base of the skull through the vertex without contrast.  Comparison: 03/26/2011.  Findings: The cerebral hemispheres and posterior fossa structures continue to have normal appearances.  Hemorrhage, mass lesion or CT evidence of acute infarction.  The ventricles remain normal in size and position.  Interval mucosal thickening and probable retained secretions in the posterior sphenoid sinus on the left.  IMPRESSION:  Interval left sphenoid sinusitis.  Otherwise, unremarkable examination.  Original Report Authenticated By: Darrol Angel, M.D.   US Renal  03/29/2011  *RADIOLOGY REPORT*  Clinical Data: Acute on chronic renal failure  RENAL/URINARY TRACT ULTRASOUND COMPLETE  Comparison:  None.  Findings:  Right Kidney:  Measures 11.9 cm.  Echogenic renal parenchyma, likely reflecting medical renal disease.  No hydronephrosis.  Left Kidney:  Measures 12.6 cm.  Echogenic renal parenchyma, likely reflecting medical renal disease.  No hydronephrosis.  Bladder:  Decompressed by indwelling Foley catheter.  IMPRESSION: No hydronephrosis.  Echogenic renal parenchyma, likely reflecting medical renal disease.  Original Report Authenticated By: Charline Bills, M.D.   Dg Chest Port 1 View  03/30/2011  *RADIOLOGY REPORT*  Clinical Data: Intubated.  PORTABLE CHEST - 1 VIEW  Comparison: 03/29/2011  Findings: Support devices are unchanged.  There is cardiomegaly. Bilateral lower lobe airspace opacities, left greater than right suspected small bilateral effusions.  No real change since prior study.  IMPRESSION:  No interval change.  Original Report Authenticated By: Cyndie Chime, M.D.   Dg Chest Port 1 View  03/29/2011  *RADIOLOGY REPORT*  Clinical Data: Antegrade, pneumonia  PORTABLE CHEST - 1 VIEW  Comparison: 03/28/2011  Findings: Endotracheal tube at the carina directed to the right main stem.   This should be retracted 3 cm.  NG tube in the stomach. Right IJ central line in the SVC region.  Heart remains enlarged with dense left lower lobe consolidation/collapse.  No significant interval change.  No pneumothorax.  IMPRESSION: Low endotracheal tube at the carina directed to the right main stem should be retracted 3 cm.  Otherwise stable chest exam.  Findings called to [facility], patient's nurse on 2900.  Original Report Authenticated By: Judie Petit. Ruel Favors, M.D.   Dg Chest Port 1 View  03/28/2011  *RADIOLOGY REPORT*  Clinical Data: Ventilator dependent  PORTABLE CHEST - 1 VIEW  Comparison: 03/26/2010  Findings: Endotracheal tube terminates 2.5 cm above the carina.  Stable right IJ venous catheter and enteric tube.  Mild interstitial edema with probable bilateral pleural effusions, improved.  Stable moderate cardiomegaly.  IMPRESSION: Endotracheal tube terminates 2.5 cm above the carina.  Stable moderate cardiomegaly.  Mild interstitial edema with probable bilateral pleural effusions, improved.  Original Report Authenticated By: Charline Bills, M.D.    Cardiac Studies:  Assessment/Plan:  Status post PE A cardiac arrest Resolving acute pulmonary edema Hypertensive heart disease with systolic dysfunction Acute respiratory failure secondary to pneumonia/ARDS Progressive acute renal failure workup in progress Anemia Seizure disorder rule out anoxic encephalopathy Plan Continue present management per CCM and renal service Agree with labetalol I will follow when necessary as needed  LOS: 4 days    Hulbert Branscome N 03/30/2011, 12:26 PM

## 2011-03-30 NOTE — Progress Notes (Signed)
Pt. Opened eyes spontaneously and looked around room. Moved L hand to ETT. No following commands or indicating recognition of family members.  Gaging against vent. Med w/2mg  Versed IV.

## 2011-03-30 NOTE — Progress Notes (Signed)
Nutrition Follow-up  Remains intubated at this time. Trickle TF started 1/5 by MD. Currently ordered for Nepro at 20 ml/hr. This provides: 864 kcal, 39 g protein, 349 ml free water. Current Propofol at 8.4 mL/hr, providing: 222 kcal/d. Nepro and Propofol provide: 1086 kcal, 39 g protein. This provides: 63% re-estimated kcal needs, 46% protein needs. Per nephrologist note, no indication for RRT yet. RN reports pt tolerating TF well at this time.  Diet Order:  NPO  Re-estimated Nutritional Needs:  Kcal: 1718 kcal  Protein: 85-95 gm protein  Fluid: 1.5 L  Meds: Scheduled Meds:   . antiseptic oral rinse  15 mL Mouth Rinse QID  . artificial tears  1 application Both Eyes Q8H  . cefTRIAXone (ROCEPHIN) IVPB 1 gram/50 mL D5W  1 g Intravenous Q24H  . chlorhexidine  15 mL Mouth Rinse BID  . famotidine (PEPCID) IV  20 mg Intravenous QHS  . furosemide  80 mg Intravenous Once  . heparin  5,000 Units Subcutaneous Q8H  . influenza  inactive virus vaccine  0.5 mL Intramuscular Tomorrow-1000  . insulin aspart  0-15 Units Subcutaneous Q4H  . labetalol  200 mg Per Tube BID  . methylPREDNISolone (SOLU-MEDROL) injection  500 mg Intravenous Daily  . phenytoin (DILANTIN) IV  100 mg Intravenous Q8H  . sodium chloride  3 mL Intravenous Q12H  . sodium polystyrene  30 g Oral Once   Continuous Infusions:   . sodium chloride 10 mL/hr at 03/28/11 2059  . sodium chloride 10 mL/hr at 03/30/11 0700  . feeding supplement (NEPRO CARB STEADY) 1,000 mL (03/29/11 1518)  . propofol 20 mcg/kg/min (03/30/11 0927)  . DISCONTD: feeding supplement (OSMOLITE 1.2 CAL) 1,000 mL (03/28/11 1628)  . DISCONTD: norepinephrine (LEVOPHED) Adult infusion Stopped (03/29/11 0545)   PRN Meds:.sodium chloride, sodium chloride, hydrALAZINE, LORazepam, sodium chloride  Labs:  CMP     Component Value Date/Time   NA 137 03/30/2011 0415   K 4.6 03/30/2011 0415   CL 108 03/30/2011 0415   CO2 16* 03/30/2011 0415   GLUCOSE 146* 03/30/2011 0415   BUN 37* 03/30/2011 0415   CREATININE 4.73* 03/30/2011 0415   CALCIUM 7.4* 03/30/2011 0415   PROT 3.8* 03/28/2011 1648   ALBUMIN 1.8* 03/30/2011 0415   AST 40* 03/28/2011 1648   ALT 16 03/28/2011 1648   ALKPHOS 50 03/28/2011 1648   BILITOT 0.1* 03/28/2011 1648   GFRNONAA 12* 03/30/2011 0415   GFRAA 14* 03/30/2011 0415  Phoshphorus 8.9 H Magnesium 2.2  CBG (last 3)   Basename 03/30/11 0807 03/30/11 0422 03/29/11 2348  GLUCAP 126* 137* 118*    No results found for this basename: HGBA1C    Intake/Output Summary (Last 24 hours) at 03/30/11 1035 Last data filed at 03/30/11 0927  Gross per 24 hour  Intake 1061.41 ml  Output    335 ml  Net 726.41 ml    Weight Status:  72.5 kg, wt stable  Nutrition Dx:  Inadequate oral intake - persists.  Goal: Initiate nutrition support in next 24-48 hours if extubation not expected to meet >90% of estimated nutrition needs. Met. Monitor: Nutrition support initiation, weight, labs I/O's  Intervention:   1. If propofol discontinued, recommend Nepro at 35 ml/hr, with 30 ml Prostat via tube BID. This TF regimen will provide: 1656 kcal, 98 g protein, 611 ml free water. 2. RD to follow nutrition care plan.  Adair Laundry Pager #:  [phone]

## 2011-03-30 NOTE — Progress Notes (Signed)
Name: Heather Sims MRN: [id number] DOB: [birth year]  LOS: 4  CRITICAL CARE PROGRESS NOTE  History of Present Illness: 21 y/o W with no PMH presented to the [facility] ED after respiratory arrest. Had URI symptoms for the past month for which she was treated with one week of avelox two weeks prior without improvement. Also given antihypertensive for severe HTN in UC. On the night of admission she had the sudden onset of shortness of breath while out with a friend getting food. She was still talking when EMS arrived, but suddenly developed respiratory arrest after their arrival.  EMS performed CPR for PEA arrest for less than five minutes and she regained a pulse with no meds.  Upon arrival to the [facility] ED she was intubated and started on the arctic sun protocol.  She was hypertensive after arrival.  PCCM consulted for admission.  Lines / Drains: 03/26/11 ETT >> 03/26/11 R IJ CVL  03/26/11 R fem CVL >>1/4 03/26/11 L radial a-line >>  Cultures / Sepsis markers: 03/26/11 blood cx x2 >>ngtd 1/7>> 03/26/11 sputum cx >>neg 03/26/11 flu >> NEG 03/26/11 strep/leg ur ag >>neg  03/26/11 - Urine tox >>NEG  03/27/11 HIV test >>NR  Antibiotics: 03/26/11 vanc >>1/5 03/26/11 ceftriaxone >> 03/26/11 moxi >>1/5 03/26/11 tamiflu >>03/27/11  Tests / Events: 03/26/11 CT Head >>No evidence of acute intracranial hemorrhage, mass lesion, or acute Infarct. 1/4 2 D echo >The cavity size was normal. Wall thickness was increased in a pattern of moderate LVH. Systolic function was moderately reduced. The estimated ejection fraction was in the range of 35% to 40%. Wall motion was normal; 1/4 TEE >>diffuse hypokinesis, EF 40-45% 1/4 venous doppler bilaterally -NEG for VT  1/5 CT Head >>Interval left sphenoid sinusitis. Otherwise, unremarkable examination. 1/5 Renal consult for worsening renal failure -Dr. Melvia Heaps 1/5 Neuro consult >new onset Seizures   1/5 EEG >> 1/6 weaned off pressors , tranx 3 u PRBC 1/7-  Steroid bolus for possible autoimmune  cause of renal failure   Vital Signs:    Filed Vitals:   03/30/11 1200  BP: 114/67  Pulse: 93  Temp: 99 F (37.2 C)  Resp: 26   Vent Mode:  [-] PRVC FiO2 (%):  [29.7 %-30.3 %] 30 % Set Rate:  [24 bmp] 24 bmp Vt Set:  [380 mL] 380 mL PEEP:  [4.6 cmH20-5 cmH20] 5 cmH20 Plateau Pressure:  [15 cmH20-19 cmH20] 15 cmH20   Physical Examination: Gen: sedated on vent  HEENT:   OP clear, ETT in place Neck: supple without masses PULM: coarse breath sounds bilaterally CV: tachy, no mgr, no clear JVD AB: BS infrequent, soft, nontender, no hsm Ext: no edema, no clubbing, no cyanosis Derm: no rash or skin breakdown Neuro: sedated on vent  Psyche: cannot assess  Labs and Imaging:    PCXR: no interval change  CBC    Component Value Date/Time   WBC 26.3* 03/30/2011 0415   RBC 3.86* 03/30/2011 0415   HGB 11.2* 03/30/2011 0415   HCT 33.3* 03/30/2011 0415   PLT 293 03/30/2011 0415   MCV 86.3 03/30/2011 0415   MCH 29.0 03/30/2011 0415   MCHC 33.6 03/30/2011 0415   RDW 16.0* 03/30/2011 0415   LYMPHSABS 2.1 03/28/2011 1545   MONOABS 0.7 03/28/2011 1545   EOSABS 0.3 03/28/2011 1545   BASOSABS 0.1 03/28/2011 1545    BMET    Component Value Date/Time   NA 137 03/30/2011 0415   K 4.6 03/30/2011 0415   CL 108 03/30/2011 0415  CO2 16* 03/30/2011 0415   GLUCOSE 146* 03/30/2011 0415   BUN 37* 03/30/2011 0415   CREATININE 4.73* 03/30/2011 0415   CALCIUM 7.4* 03/30/2011 0415   GFRNONAA 12* 03/30/2011 0415   GFRAA 14* 03/30/2011 0415    Assessment and Plan:  21 y/o female with no past medical history with witnessed arrest w/ immediate healthcare provided CPR. Prodrome of Resp symptoms x 1 month prior to admission w/ associated severe HTN, swelling. On admission with  pulmonary edema and significant cardiomegaly with  acute renal failure , anemia  on admission 1/3       1. VDRF    Due to pulmonary edema on admission  +/- PNA -Asp. , possible autoimmune disorder including anti-gbm ab   -full vent support - Daily ABG,  PCXR - no effort to wean given mental function    2. Cardiac PEA arrest, unresponsive on arrival here 2D echo 1/4 shows LVH with EF 35%, TEE - no VSD  Pulmonary edema  -poor response to lasx, hold for now -cards on board  - HTN- chronic, labetolol vs nicardipine gtt if needed  3. Renal:  AKI, cause unknown. precipitous rise in creatinine. May need CVVHD. Autoimmune w/up per renal in progress. Steroid trial  -holding lasix -CVVHD in 48-72 hrs   4. ID : community acquired pneumonia, unclear if aspirated as part of code. Flu negative HIV neg .   - culture neg, afebrile, WBC going up -cont rocephin for now, may de-escalate    5. Neuro  New Onset Seizure Activity (03/28/2011)   - neuro following  -Cont dilantin/profolol, prn Ativan  -EEG pending.   6. Heme Anemia (03/26/2011)  Hb improved s/p 3 u PRBC.  -prbc for hgb < 8gm% -will cont on sq hep for now as if autoimmune-higher risk for dvt/pe. Monitor hbg closely   7. Other Family updated at bedside.-mother and  Biological father  Best practices / Disposition: ICU status on PCCM service Analgesia:  Sedation: propofol Thromboprophylaxis: sub q hep HOB >30 degrees Ulcer prophylaxis: famotidine Glucose control/hyperglycemia: follow cbg Diet : tube feed     CCM time: 40 minutes  Merton Border, MD;  PCCM service; Mobile [phone]

## 2011-03-30 NOTE — Progress Notes (Signed)
With comparison to 0400 assessment when applying ointment I noticed pupils bilaterally had increased with noted response bilaterally with response of left greater than right

## 2011-03-30 NOTE — Procedures (Signed)
HISTORY:  [name], status post PE arrest with noted seizure activity.  MEDICATIONS:  Versed, Dilantin, Diprivan, Apresoline and NovoLog.  CONDITIONS OF RECORDING:  This is a 16-channel EEG carried out with the patient in the unresponsive state.  DESCRIPTION:  The background activity is poorly organized and consists of a mixture of theta and delta rhythms.  There is occasional superimposed beta activity that is noted over both hemispheres and of low voltage.  The poorly organized theta and delta activity is persistent throughout.  Activity is continuous.  No epileptiform activity is noted.  Hypoventilation was not performed.  Intermittent photic stimulation failed to elicit any change in the tracing.  IMPRESSION:  This is an abnormal EEG secondary to general background slowing.  No epileptiform activity is noted.          ______________________________ Thana Farr, MD    QI:ONGE D:  03/30/2011 17:53:37  T:  03/30/2011 20:57:48  Job #:  [id number]

## 2011-03-30 NOTE — Progress Notes (Signed)
Subjective: Patient remains on Diprovan and Dilantin.  No further seizure activity noted.  Mother reports that when staff attempting to clean patient and when she was attempting to do her hair she turned her head and moved as if agitated.  Objective: Current vital signs: BP 121/76  Pulse 94  Temp(Src) 99.1 F (37.3 C) (Core (Comment))  Resp 25  Ht 5\' 2"  (1.575 m)  Wt 72.5 kg (159 lb 13.3 oz)  BMI 29.23 kg/m2  SpO2 98%  LMP 03/02/2011 Vital signs in last 24 hours: Temp:  [98.4 F (36.9 C)-99.8 F (37.7 C)] 99.1 F (37.3 C) (01/07 1540) Pulse Rate:  [92-108] 94  (01/07 1540) Resp:  [24-31] 25  (01/07 1540) BP: (109-150)/(60-98) 121/76 mmHg (01/07 1400) SpO2:  [96 %-100 %] 98 % (01/07 1540) Arterial Line BP: (117-201)/(71-111) 158/86 mmHg (01/07 1400) FiO2 (%):  [29.7 %-30.3 %] 30 % (01/07 1540) Weight:  [72.5 kg (159 lb 13.3 oz)] 159 lb 13.3 oz (72.5 kg) (01/07 0400)  Intake/Output from previous day: 01/06 0701 - 01/07 0700 In: 1121.2 [I.V.:597.2; NG/GT:470; IV Piggyback:54] Out: 285 [Urine:285] Intake/Output this shift: Total I/O In: 316.9 [I.V.:122.9; Other:50; NG/GT:140; IV Piggyback:4] Out: 180 [Urine:150; Emesis/NG output:30] Nutritional status:    Neurologic Exam: Mental Status:  Patient does not respond to verbal stimuli. Does not respond to deep sternal rub. Does not follow commands. No verbalizations noted.  Cranial Nerves:  II: patient does not respond confrontation bilaterally, pupils right 4 mm, left 4 mm,and reactive bilaterally  III,IV,VI: doll's response present bilaterally.  V,VII: corneal reflex present bilaterally  VIII: patient does not respond to verbal stimuli  IX,X: gag reflex unable to test, XI: trapezius strength unable to test bilaterally  XII: tongue strength unable to test  Motor:  Extremities flaccid throughout. No purposeful or spontaneous movements noted.  Sensory:  Does not respond to noxious stimuli in any extremity.  Deep Tendon  Reflexes:  1+ throughout.  Plantars:  mute bilaterally  Cerebellar:  Unable to perform   Lab Results: Results for orders placed during the hospital encounter of 03/26/11 (from the past 48 hour(s))  HEPATIC FUNCTION PANEL     Status: Abnormal   Collection Time   03/28/11  4:48 PM      Component Value Range Comment   Total Protein 3.8 (*) 6.0 - 8.3 (g/dL)    Albumin 1.3 (*) 3.5 - 5.2 (g/dL)    AST 40 (*) 0 - 37 (U/L)    ALT 16  0 - 35 (U/L)    Alkaline Phosphatase 50  39 - 117 (U/L)    Total Bilirubin 0.1 (*) 0.3 - 1.2 (mg/dL)    Bilirubin, Direct <8.2  0.0 - 0.3 (mg/dL)    Indirect Bilirubin NOT CALCULATED  0.3 - 0.9 (mg/dL)   LACTATE DEHYDROGENASE     Status: Abnormal   Collection Time   03/28/11  5:30 PM      Component Value Range Comment   LD 282 (*) 94 - 250 (U/L)   PATHOLOGIST SMEAR REVIEW     Status: Normal   Collection Time   03/28/11  5:30 PM      Component Value Range Comment   Tech Review MARKED NORMOCHROMIC     RENAL FUNCTION PANEL     Status: Abnormal   Collection Time   03/28/11  5:30 PM      Component Value Range Comment   Sodium 137  135 - 145 (mEq/L)    Potassium 4.5  3.5 - 5.1 (  mEq/L)    Chloride 112  96 - 112 (mEq/L)    CO2 16 (*) 19 - 32 (mEq/L)    Glucose, Bld 107 (*) 70 - 99 (mg/dL)    BUN 22  6 - 23 (mg/dL)    Creatinine, Ser 3.08 (*) 0.50 - 1.10 (mg/dL)    Calcium 6.9 (*) 8.4 - 10.5 (mg/dL)    Phosphorus 5.7 (*) 2.3 - 4.6 (mg/dL)    Albumin 1.3 (*) 3.5 - 5.2 (g/dL)    GFR calc non Af Amer 17 (*) >90 (mL/min)    GFR calc Af Amer 20 (*) >90 (mL/min)   MAGNESIUM     Status: Normal   Collection Time   03/28/11  5:30 PM      Component Value Range Comment   Magnesium 1.6  1.5 - 2.5 (mg/dL)   URIC ACID     Status: Abnormal   Collection Time   03/28/11  5:30 PM      Component Value Range Comment   Uric Acid, Serum 7.1 (*) 2.4 - 7.0 (mg/dL)   CORTISOL     Status: Normal   Collection Time   03/28/11  5:30 PM      Component Value Range Comment   Cortisol,  Plasma 19.8     KAPPA/LAMBDA LIGHT CHAINS     Status: Abnormal   Collection Time   03/28/11  5:30 PM      Component Value Range Comment   KAPPA FREE LGHT CHN 8.61 (*) 0.33 - 1.94 (mg/dL)    Lamda free light chains 5.89 (*) 0.57 - 2.63 (mg/dL)    Kappa, lamda light chain ratio 1.46  0.26 - 1.65    ANA     Status: Normal   Collection Time   03/28/11  5:30 PM      Component Value Range Comment   ANA NEGATIVE  NEGATIVE    ANTI-DNA ANTIBODY, DOUBLE-STRANDED     Status: Normal   Collection Time   03/28/11  5:30 PM      Component Value Range Comment   ds DNA Ab 22  <30 (IU/mL)   HEPATITIS PANEL, ACUTE     Status: Normal   Collection Time   03/28/11  5:30 PM      Component Value Range Comment   Hepatitis B Surface Ag NEGATIVE  NEGATIVE     HCV Ab NEGATIVE  NEGATIVE     Hep A IgM NEGATIVE  NEGATIVE     Hep B C IgM NEGATIVE  NEGATIVE    PARATHYROID HORMONE, INTACT (NO CA)     Status: Abnormal   Collection Time   03/28/11  5:30 PM      Component Value Range Comment   PTH 250.2 (*) 14.0 - 72.0 (pg/mL)   DIRECT ANTIGLOBULIN TEST     Status: Normal   Collection Time   03/28/11  6:20 PM      Component Value Range Comment   DAT, complement NEG      DAT, IgG NEG     PREPARE RBC (CROSSMATCH)     Status: Normal   Collection Time   03/28/11  6:20 PM      Component Value Range Comment   Order Confirmation ORDER PROCESSED BY BLOOD BANK     GLUCOSE, CAPILLARY     Status: Abnormal   Collection Time   03/28/11  8:38 PM      Component Value Range Comment   Glucose-Capillary 114 (*) 70 - 99 (mg/dL)   PROTEIN /  CREATININE RATIO, URINE     Status: Abnormal   Collection Time   03/29/11 12:55 AM      Component Value Range Comment   Creatinine, Urine 125.87      Total Protein, Urine 50   NO NORMAL RANGE ESTABLISHED FOR THIS TEST   PROTEIN CREATININE RATIO 0.40 (*) 0.00 - 0.15    PROTEIN ELECTROPHORESIS, URINE     Status: Abnormal   Collection Time   03/29/11 12:55 AM      Component Value Range Comment   Time  RANDOM   CORRECTED ON 01/07 AT 1439: PREVIOUSLY REPORTED AS URINE, CATHETERIZED   Volume, Urine RANDOM   CORRECTED ON 01/07 AT 1439: PREVIOUSLY REPORTED AS URINE, CATHETERIZED   Total Protein, Urine 33.4   No established reference range.   Total Protein, Urine-Ur/day NOT CALC  10 - 140 (mg/day)    Albumin, U PENDING      Alpha 1, Urine PENDING      Alpha 2, Urine PENDING      Beta, Urine PENDING      Gamma Globulin, Urine PENDING      Free Kappa Lt Chains,Ur 14.00 (*) 0.14 - 2.42 (mg/dL)    Free Lt Chn Excr Rate NOT CALC      Free Lambda Lt Chains,Ur 2.02 (*) 0.02 - 0.67 (mg/dL)    Free Lambda Excretion/Day NOT CALC      Free Kappa/Lambda Ratio 6.93  2.04 - 10.37 (ratio) (NOTE)   Immunofixation, Urine PENDING     PREPARE RBC (CROSSMATCH)     Status: Normal   Collection Time   03/29/11  2:52 AM      Component Value Range Comment   Order Confirmation ORDER PROCESSED BY BLOOD BANK     GLUCOSE, CAPILLARY     Status: Abnormal   Collection Time   03/29/11  3:35 AM      Component Value Range Comment   Glucose-Capillary 165 (*) 70 - 99 (mg/dL)   POCT I-STAT 3, BLOOD GAS (G3+)     Status: Abnormal   Collection Time   03/29/11  4:43 AM      Component Value Range Comment   pH, Arterial 7.340 (*) 7.350 - 7.400     pCO2 arterial 29.3 (*) 35.0 - 45.0 (mmHg)    pO2, Arterial 89.0  80.0 - 100.0 (mmHg)    Bicarbonate 15.9 (*) 20.0 - 24.0 (mEq/L)    TCO2 17  0 - 100 (mmol/L)    O2 Saturation 97.0      Acid-base deficit 9.0 (*) 0.0 - 2.0 (mmol/L)    Patient temperature 97.9 F      Collection site ARTERIAL LINE      Drawn by RT      Sample type ARTERIAL     RENAL FUNCTION PANEL     Status: Abnormal   Collection Time   03/29/11  6:45 AM      Component Value Range Comment   Sodium 136  135 - 145 (mEq/L)    Potassium 5.6 (*) 3.5 - 5.1 (mEq/L) HEMOLYSIS AT THIS LEVEL MAY AFFECT RESULT   Chloride 107  96 - 112 (mEq/L)    CO2 15 (*) 19 - 32 (mEq/L)    Glucose, Bld 134 (*) 70 - 99 (mg/dL)    BUN 24 (*)  6 - 23 (mg/dL)    Creatinine, Ser 3.24 (*) 0.50 - 1.10 (mg/dL)    Calcium 7.5 (*) 8.4 - 10.5 (mg/dL)    Phosphorus 7.1 (*)  2.3 - 4.6 (mg/dL)    Albumin 1.6 (*) 3.5 - 5.2 (g/dL)    GFR calc non Af Amer 16 (*) >90 (mL/min)    GFR calc Af Amer 18 (*) >90 (mL/min)   CBC     Status: Abnormal   Collection Time   03/29/11  6:45 AM      Component Value Range Comment   WBC 13.4 (*) 4.0 - 10.5 (K/uL)    RBC 4.04  3.87 - 5.11 (MIL/uL)    Hemoglobin 11.7 (*) 12.0 - 15.0 (g/dL) POST TRANSFUSION SPECIMEN   HCT 34.7 (*) 36.0 - 46.0 (%)    MCV 85.9  78.0 - 100.0 (fL)    MCH 29.0  26.0 - 34.0 (pg)    MCHC 33.7  30.0 - 36.0 (g/dL)    RDW 40.9  81.1 - 91.4 (%)    Platelets 259  150 - 400 (K/uL)   MAGNESIUM     Status: Normal   Collection Time   03/29/11  6:45 AM      Component Value Range Comment   Magnesium 1.9  1.5 - 2.5 (mg/dL)   GLUCOSE, CAPILLARY     Status: Abnormal   Collection Time   03/29/11  8:04 AM      Component Value Range Comment   Glucose-Capillary 137 (*) 70 - 99 (mg/dL)   GLUCOSE, CAPILLARY     Status: Abnormal   Collection Time   03/29/11 11:58 AM      Component Value Range Comment   Glucose-Capillary 118 (*) 70 - 99 (mg/dL)   GLUCOSE, CAPILLARY     Status: Abnormal   Collection Time   03/29/11  3:54 PM      Component Value Range Comment   Glucose-Capillary 129 (*) 70 - 99 (mg/dL)   GLUCOSE, CAPILLARY     Status: Abnormal   Collection Time   03/29/11  7:35 PM      Component Value Range Comment   Glucose-Capillary 149 (*) 70 - 99 (mg/dL)   GLUCOSE, CAPILLARY     Status: Abnormal   Collection Time   03/29/11 11:48 PM      Component Value Range Comment   Glucose-Capillary 118 (*) 70 - 99 (mg/dL)   RENAL FUNCTION PANEL     Status: Abnormal   Collection Time   03/30/11  4:15 AM      Component Value Range Comment   Sodium 137  135 - 145 (mEq/L)    Potassium 4.6  3.5 - 5.1 (mEq/L)    Chloride 108  96 - 112 (mEq/L)    CO2 16 (*) 19 - 32 (mEq/L)    Glucose, Bld 146 (*) 70 - 99 (mg/dL)     BUN 37 (*) 6 - 23 (mg/dL) DELTA CHECK NOTED   Creatinine, Ser 4.73 (*) 0.50 - 1.10 (mg/dL)    Calcium 7.4 (*) 8.4 - 10.5 (mg/dL)    Phosphorus 8.9 (*) 2.3 - 4.6 (mg/dL)    Albumin 1.8 (*) 3.5 - 5.2 (g/dL)    GFR calc non Af Amer 12 (*) >90 (mL/min)    GFR calc Af Amer 14 (*) >90 (mL/min)   CBC     Status: Abnormal   Collection Time   03/30/11  4:15 AM      Component Value Range Comment   WBC 26.3 (*) 4.0 - 10.5 (K/uL)    RBC 3.86 (*) 3.87 - 5.11 (MIL/uL)    Hemoglobin 11.2 (*) 12.0 - 15.0 (g/dL)    HCT 78.2 (*)  36.0 - 46.0 (%)    MCV 86.3  78.0 - 100.0 (fL)    MCH 29.0  26.0 - 34.0 (pg)    MCHC 33.6  30.0 - 36.0 (g/dL)    RDW 13.0 (*) 86.5 - 15.5 (%)    Platelets 293  150 - 400 (K/uL)   MAGNESIUM     Status: Normal   Collection Time   03/30/11  4:15 AM      Component Value Range Comment   Magnesium 2.2  1.5 - 2.5 (mg/dL)   GLUCOSE, CAPILLARY     Status: Abnormal   Collection Time   03/30/11  4:22 AM      Component Value Range Comment   Glucose-Capillary 137 (*) 70 - 99 (mg/dL)   POCT I-STAT 3, BLOOD GAS (G3+)     Status: Abnormal   Collection Time   03/30/11  4:23 AM      Component Value Range Comment   pH, Arterial 7.327 (*) 7.350 - 7.400     pCO2 arterial 33.1 (*) 35.0 - 45.0 (mmHg)    pO2, Arterial 98.0  80.0 - 100.0 (mmHg)    Bicarbonate 17.2 (*) 20.0 - 24.0 (mEq/L)    TCO2 18  0 - 100 (mmol/L)    O2 Saturation 97.0      Acid-base deficit 8.0 (*) 0.0 - 2.0 (mmol/L)    Patient temperature 99.8 F      Collection site ARTERIAL LINE      Drawn by RT      Sample type ARTERIAL     GLUCOSE, CAPILLARY     Status: Abnormal   Collection Time   03/30/11  8:07 AM      Component Value Range Comment   Glucose-Capillary 126 (*) 70 - 99 (mg/dL)   GLUCOSE, CAPILLARY     Status: Abnormal   Collection Time   03/30/11 11:41 AM      Component Value Range Comment   Glucose-Capillary 117 (*) 70 - 99 (mg/dL)     Recent Results (from the past 240 hour(s))  CULTURE, BLOOD (ROUTINE X 2)      Status: Normal (Preliminary result)   Collection Time   03/26/11  8:20 PM      Component Value Range Status Comment   Specimen Description BLOOD ARM RIGHT   Final    Special Requests BOTTLES DRAWN AEROBIC AND ANAEROBIC 10CC   Final    Setup Time [id number]   Final    Culture     Final    Value:        BLOOD CULTURE RECEIVED NO GROWTH TO DATE CULTURE WILL BE HELD FOR 5 DAYS BEFORE ISSUING A FINAL NEGATIVE REPORT   Report Status PENDING   Incomplete   CULTURE, BLOOD (ROUTINE X 2)     Status: Normal (Preliminary result)   Collection Time   03/26/11  8:27 PM      Component Value Range Status Comment   Specimen Description BLOOD CENTRAL LINE   Final    Special Requests     Final    Value: BOTTLES DRAWN AEROBIC AND ANAEROBIC AERO 8CC, ANAE 5CC   Setup Time [id number]   Final    Culture     Final    Value:        BLOOD CULTURE RECEIVED NO GROWTH TO DATE CULTURE WILL BE HELD FOR 5 DAYS BEFORE ISSUING A FINAL NEGATIVE REPORT   Report Status PENDING   Incomplete   MRSA PCR SCREENING  Status: Normal   Collection Time   03/26/11 11:33 PM      Component Value Range Status Comment   MRSA by PCR NEGATIVE  NEGATIVE  Final   CULTURE, RESPIRATORY     Status: Normal   Collection Time   03/27/11  1:11 AM      Component Value Range Status Comment   Specimen Description ENDOTRACHEAL ASPIRATE   Final    Special Requests NONE   Final    Gram Stain     Final    Value: FEW WBC PRESENT, PREDOMINANTLY PMN     FEW SQUAMOUS EPITHELIAL CELLS PRESENT     RARE GRAM POSITIVE COCCI     IN PAIRS   Culture Non-Pathogenic Oropharyngeal-type Flora Isolated.   Final    Report Status 03/29/2011 FINAL   Final   PATHOLOGIST SMEAR REVIEW     Status: Normal   Collection Time   03/28/11  5:30 PM      Component Value Range Status Comment   Tech Review MARKED NORMOCHROMIC   Final     Lipid Panel No results found for this basename: CHOL,TRIG,HDL,CHOLHDL,VLDL,LDLCALC in the last 72 hours  Studies/Results: US  Renal  03/29/2011  *RADIOLOGY REPORT*  Clinical Data: Acute on chronic renal failure  RENAL/URINARY TRACT ULTRASOUND COMPLETE  Comparison:  None.  Findings:  Right Kidney:  Measures 11.9 cm.  Echogenic renal parenchyma, likely reflecting medical renal disease.  No hydronephrosis.  Left Kidney:  Measures 12.6 cm.  Echogenic renal parenchyma, likely reflecting medical renal disease.  No hydronephrosis.  Bladder:  Decompressed by indwelling Foley catheter.  IMPRESSION: No hydronephrosis.  Echogenic renal parenchyma, likely reflecting medical renal disease.  Original Report Authenticated By: Charline Bills, M.D.   Dg Chest Port 1 View  03/30/2011  *RADIOLOGY REPORT*  Clinical Data: Intubated.  PORTABLE CHEST - 1 VIEW  Comparison: 03/29/2011  Findings: Support devices are unchanged.  There is cardiomegaly. Bilateral lower lobe airspace opacities, left greater than right suspected small bilateral effusions.  No real change since prior study.  IMPRESSION: No interval change.  Original Report Authenticated By: Cyndie Chime, M.D.   Dg Chest Port 1 View  03/29/2011  *RADIOLOGY REPORT*  Clinical Data: Antegrade, pneumonia  PORTABLE CHEST - 1 VIEW  Comparison: 03/28/2011  Findings: Endotracheal tube at the carina directed to the right main stem.  This should be retracted 3 cm.  NG tube in the stomach. Right IJ central line in the SVC region.  Heart remains enlarged with dense left lower lobe consolidation/collapse.  No significant interval change.  No pneumothorax.  IMPRESSION: Low endotracheal tube at the carina directed to the right main stem should be retracted 3 cm.  Otherwise stable chest exam.  Findings called to [facility], patient's nurse on 2900.  Original Report Authenticated By: Judie Petit. Ruel Favors, M.D.   Dg Chest Port 1 View  03/28/2011  *RADIOLOGY REPORT*  Clinical Data: Ventilator dependent  PORTABLE CHEST - 1 VIEW  Comparison: 03/26/2010  Findings: Endotracheal tube terminates 2.5 cm above the carina.  Stable  right IJ venous catheter and enteric tube.  Mild interstitial edema with probable bilateral pleural effusions, improved.  Stable moderate cardiomegaly.  IMPRESSION: Endotracheal tube terminates 2.5 cm above the carina.  Stable moderate cardiomegaly.  Mild interstitial edema with probable bilateral pleural effusions, improved.  Original Report Authenticated By: Charline Bills, M.D.    Medications:  I have reviewed the patient's current medications. Scheduled:   . antiseptic oral rinse  15 mL Mouth Rinse  QID  . artificial tears  1 application Both Eyes Q8H  . cefTRIAXone (ROCEPHIN) IVPB 1 gram/50 mL D5W  1 g Intravenous Q24H  . chlorhexidine  15 mL Mouth Rinse BID  . famotidine (PEPCID) IV  20 mg Intravenous QHS  . heparin  5,000 Units Subcutaneous Q8H  . influenza  inactive virus vaccine  0.5 mL Intramuscular Tomorrow-1000  . insulin aspart  0-15 Units Subcutaneous Q4H  . labetalol  200 mg Per Tube BID  . methylPREDNISolone (SOLU-MEDROL) injection  500 mg Intravenous Daily  . phenytoin (DILANTIN) IV  100 mg Intravenous Q8H  . sodium chloride  3 mL Intravenous Q12H    Assessment/Plan:  Patient Active Hospital Problem List: Seizure (03/28/2011)   Assessment: No further clinical seizure activity noted.  Patient remains sedated.  Further testing necessary to determine the possibility of continued electrographic seizure activity.  CT unremarkable.  Remain concerned about cerebral damage.     Plan: 1. EEG pending            2. MRI of the brain pending   LOS: 4 days   Thana Farr, MD [facility] [phone] 03/30/2011  8:09 AM

## 2011-03-30 NOTE — Progress Notes (Addendum)
Renal Daily Progress Note  S:Pt intubated, sedated O:BP 150/98  Pulse 103  Temp(Src) 99.8 F (37.7 C) (Oral)  Resp 26  Ht 5\' 2"  (1.575 m)  Wt 159 lb 13.3 oz (72.5 kg)  BMI 29.23 kg/m2  SpO2 100%  LMP 03/02/2011  Intake/Output Summary (Last 24 hours) at 03/30/11 0644 Last data filed at 03/30/11 0600  Gross per 24 hour  Intake 1219.45 ml  Output    285 ml  Net 934.45 ml   Weight change: 7.1 oz (0.2 kg) ZOX:WRUEAVW CVS:RRR, systolic murmur Resp:course mechanically ventilated breath sounds UJW:JXBJ fullness or distention.  Hypoactive bowel sounds YNW:GNFAO edema in all 4 extremities, 2+ pulses CVP 7-10  Lab 03/30/11 0415 03/29/11 0645 03/28/11 1730 03/28/11 1648 03/28/11 1350 03/28/11 0950 03/28/11 0600 03/28/11 0054 03/26/11 2131 03/26/11 2022  NA 137 136 137 -- 137 138 140 140 -- --  K 4.6 5.6* 4.5 -- 4.4 4.4 4.2 4.2 -- --  CL 108 107 112 -- 112 113* 115* 115* -- --  CO2 16* 15* 16* -- 17* 17* 16* 17* -- --  GLUCOSE 146* 134* 107* -- 86 78 81 84 -- --  BUN 37* 24* 22 -- 23 22 22 21  -- --  CREATININE 4.73* 3.81* 3.54* -- 3.39* 3.18* 2.93* 2.69* -- --  ALB -- -- -- -- -- -- -- -- -- --  CALCIUM 7.4* 7.5* 6.9* -- 6.9* 6.9* 6.9* 7.0* -- --  PHOS 8.9* 7.1* 5.7* -- -- -- -- -- -- --  AST -- -- -- 40* -- -- -- -- 53* 47*  ALT -- -- -- 16 -- -- -- -- 21 21   Liver Function Tests:  Lab 03/30/11 0415 03/29/11 0645 03/28/11 1730 03/28/11 1648 03/26/11 2131 03/26/11 2022  AST -- -- -- 40* 53* 47*  ALT -- -- -- 16 21 21   ALKPHOS -- -- -- 50 66 77  BILITOT -- -- -- 0.1* 0.2* 0.2*  PROT -- -- -- 3.8* 4.5* 5.4*  ALBUMIN 1.8* 1.6* 1.3* -- -- --    Lab 03/26/11 2131  LIPASE 29  AMYLASE 74    CBC:  Lab 03/30/11 0415 03/29/11 0645 03/28/11 1545 03/27/11 0556 03/26/11 2300  WBC 26.3* 13.4* 10.2 -- --  NEUTROABS -- -- 7.0 -- --  HGB 11.2* 11.7* 6.6* -- --  HCT 33.3* 34.7* 19.2* -- --  MCV 86.3 85.9 87.7 86.8 88.2  PLT 293 259 262 -- --   Cardiac Enzymes:  Lab 03/27/11  2200 03/27/11 1440 03/27/11 0800 03/26/11 2300  CKTOTAL 1822* 2257* 2318* 2085*  CKMB 12.1* 13.4* 13.6* 14.5*  CKMBINDEX -- -- -- --  TROPONINI <0.30 <0.30 <0.30 0.35*   CBG:  Lab 03/30/11 0422 03/29/11 2348 03/29/11 1935 03/29/11 1554 03/29/11 1158  GLUCAP 137* 118* 149* 129* 118*   Studies/Results: PCXR 03/30/2011: Improved aeration, pleural effusions   Current Medications:    . antiseptic oral rinse  15 mL Mouth Rinse QID  . artificial tears  1 application Both Eyes Q8H  . cefTRIAXone (ROCEPHIN) IVPB 1 gram/50 mL D5W  1 g Intravenous Q24H  . chlorhexidine  15 mL Mouth Rinse BID  . famotidine (PEPCID) IV  20 mg Intravenous QHS  . furosemide  80 mg Intravenous Once  . heparin  5,000 Units Subcutaneous Q8H  . influenza  inactive virus vaccine  0.5 mL Intramuscular Tomorrow-1000  . insulin aspart  0-15 Units Subcutaneous Q4H  . methylPREDNISolone (SOLU-MEDROL) injection  500 mg Intravenous Daily  . phenytoin (  DILANTIN) IV  100 mg Intravenous Q8H  . sodium chloride  3 mL Intravenous Q12H  . sodium polystyrene  30 g Oral Once      . sodium chloride 10 mL/hr at 03/28/11 2059  . sodium chloride 10 mL/hr at 03/30/11 0600  . feeding supplement (NEPRO CARB STEADY) 1,000 mL (03/29/11 1518)  . norepinephrine (LEVOPHED) Adult infusion Stopped (03/29/11 0545)  . propofol 40 mcg/kg/min (03/30/11 0644)  . DISCONTD: feeding supplement (OSMOLITE 1.2 CAL) 1,000 mL (03/28/11 1628)       Assessment/Plan:  21 year old female with cardiac arrest due to respiratory arrest, who has significant cardiomyopathy and HTN, with worsening renal failure.Marland KitchenMarland Kitchen  1) Renal Failure- creatinine increased to 4.7 today, poor UOP, which did not have robust response to Lasix yesterday.  CVP ranges from 7-11, but peripherally edematous.  Pt has proteinuria with microhematuria, very low albumin and anemia. Suggestive of systemic disease, autoimmune top of the list. Serologic work-up pending, giving bolus steroids.  No thrombocytopenia to suggest TTP-HUS. Didn't have much of a lasix response and with CVP of 7, even with edema (3rd spacing) will hold off on further lasix today.  No indication for RRT yet, but with accelerated rate of rise in creatinine over past 24 hours - if continues at this rate - will likely required HD next couple of days. 2) Pulm - CXR continues to improve.  Had white-out pulm infiltrates on presentation which is improving without diuresis or dialysis. 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 BP dec'd prob secondary to meds, and was on pressors briefly, but now off pressors and BP coming up. Antibiotics, ARDS protocol and vent settings per CCM  3) Neuro- seizure activity- concerning for anoxic brain injury, now on propofol and dilantin.  4) Heme- hemoglobin droped, improved after 3 units PRBC's, no evidence of acute bleed, may be due to critical illness. Ordered Coomb's and hemolysis workup. No thrombocytopenia.  5) CV- Low EF in 35% range on ECHO, no focal WMA. Pt off pressors, now BP's mildly elevated, which is acceptable considering she has likely had chronic HTN for several months.  Consider labetolol vs. Nicardipine depending on pulse for BP control if needed.  6) Disposition- Per CCM, patient critically ill on ventilator and concern for anoxic brain injury  CHAMBERLAIN,RACHEL 03/30/2011 6:44 AM I have seen and examined this patient and agree with plan  As outlined above.  Suspicion for underlying renal disease high but curiously not nephrotic by ZOX:WRUEA ratio of 0.4 (= 400 mg/gm creat).  Multiple pending studies; kidneys normal in size but very echogenic. HIV negative.  May require HD next couple of days. Add labetolol via tube unless cardiology has other preference.  Ivorie Uplinger B,MD 03/30/2011 9:59 AM

## 2011-03-31 ENCOUNTER — Inpatient Hospital Stay (HOSPITAL_COMMUNITY): Payer: Medicaid Other

## 2011-03-31 DIAGNOSIS — I501 Left ventricular failure: Secondary | ICD-10-CM

## 2011-03-31 DIAGNOSIS — I10 Essential (primary) hypertension: Secondary | ICD-10-CM

## 2011-03-31 DIAGNOSIS — J9600 Acute respiratory failure, unspecified whether with hypoxia or hypercapnia: Secondary | ICD-10-CM

## 2011-03-31 DIAGNOSIS — N179 Acute kidney failure, unspecified: Secondary | ICD-10-CM

## 2011-03-31 LAB — BLOOD GAS, ARTERIAL
Bicarbonate: 16.1 mEq/L — ABNORMAL LOW (ref 20.0–24.0)
O2 Saturation: 99.6 %
PEEP: 5 cmH2O
Patient temperature: 98.6
RATE: 24 resp/min

## 2011-03-31 LAB — UIFE/LIGHT CHAINS/TP QN, 24-HR UR
Alpha 1, Urine: DETECTED — AB
Alpha 2, Urine: DETECTED — AB
Free Kappa Lt Chains,Ur: 14 mg/dL — ABNORMAL HIGH (ref 0.14–2.42)
Gamma Globulin, Urine: DETECTED — AB

## 2011-03-31 LAB — POCT I-STAT 3, ART BLOOD GAS (G3+)
Bicarbonate: 17.4 mEq/L — ABNORMAL LOW (ref 20.0–24.0)
O2 Saturation: 99 %
TCO2: 18 mmol/L (ref 0–100)
pCO2 arterial: 34.2 mmHg — ABNORMAL LOW (ref 35.0–45.0)
pO2, Arterial: 145 mmHg — ABNORMAL HIGH (ref 80.0–100.0)

## 2011-03-31 LAB — GLUCOSE, CAPILLARY
Glucose-Capillary: 107 mg/dL — ABNORMAL HIGH (ref 70–99)
Glucose-Capillary: 137 mg/dL — ABNORMAL HIGH (ref 70–99)

## 2011-03-31 LAB — CBC
HCT: 32.8 % — ABNORMAL LOW (ref 36.0–46.0)
Hemoglobin: 10.8 g/dL — ABNORMAL LOW (ref 12.0–15.0)
MCHC: 33.8 g/dL (ref 30.0–36.0)
MCHC: 32.9 g/dL (ref 30.0–36.0)
Platelets: 300 10*3/uL (ref 150–400)
RDW: 16 % — ABNORMAL HIGH (ref 11.5–15.5)
RDW: 16.4 % — ABNORMAL HIGH (ref 11.5–15.5)

## 2011-03-31 LAB — RENAL FUNCTION PANEL
Albumin: 1.7 g/dL — ABNORMAL LOW (ref 3.5–5.2)
CO2: 16 mEq/L — ABNORMAL LOW (ref 19–32)
Calcium: 7.4 mg/dL — ABNORMAL LOW (ref 8.4–10.5)
GFR calc Af Amer: 14 mL/min — ABNORMAL LOW (ref >90)
GFR calc non Af Amer: 12 mL/min — ABNORMAL LOW (ref >90)
Sodium: 142 mEq/L (ref 135–145)

## 2011-03-31 LAB — PROTEIN ELECTROPHORESIS, SERUM
Albumin ELP: 45.1 % — ABNORMAL LOW (ref 55.8–66.1)
Alpha-1-Globulin: 10.8 % — ABNORMAL HIGH (ref 2.9–4.9)
Beta 2: 5.1 % (ref 3.2–6.5)
Beta Globulin: 5.8 % (ref 4.7–7.2)

## 2011-03-31 LAB — HAPTOGLOBIN: Haptoglobin: 165 mg/dL (ref 30–200)

## 2011-03-31 MED ORDER — PHENYTOIN SODIUM 50 MG/ML IJ SOLN
100.0000 mg | Freq: Two times a day (BID) | INTRAMUSCULAR | Status: DC
  Filled 2011-03-31 (×2): qty 2

## 2011-03-31 MED ORDER — FENTANYL BOLUS VIA INFUSION
50.0000 ug | Freq: Four times a day (QID) | INTRAVENOUS | Status: DC | PRN
  Administered 2011-04-01: 100 ug via INTRAVENOUS
  Filled 2011-03-31: qty 100

## 2011-03-31 MED ORDER — FAMOTIDINE 40 MG/5ML PO SUSR
20.0000 mg | Freq: Every day | ORAL | Status: DC
  Administered 2011-03-31 – 2011-04-01 (×2): 20 mg
  Filled 2011-03-31 (×4): qty 2.5

## 2011-03-31 MED ORDER — SODIUM BICARBONATE 650 MG PO TABS
1300.0000 mg | ORAL_TABLET | Freq: Three times a day (TID) | ORAL | Status: DC
  Administered 2011-03-31 – 2011-04-02 (×8): 1300 mg via ORAL
  Filled 2011-03-31 (×13): qty 2

## 2011-03-31 MED ORDER — SODIUM CHLORIDE 0.9 % IV SOLN
50.0000 ug/h | INTRAVENOUS | Status: DC
  Administered 2011-03-31: 50 ug/h via INTRAVENOUS
  Administered 2011-04-01: 100 ug/h via INTRAVENOUS
  Administered 2011-04-01: 50 ug/h via INTRAVENOUS
  Filled 2011-03-31 (×2): qty 50

## 2011-03-31 MED ORDER — SODIUM CHLORIDE 0.9 % IV SOLN
INTRAVENOUS | Status: DC
  Administered 2011-03-31: 19:00:00 via INTRAVENOUS
  Administered 2011-04-03: 10 mL/h via INTRAVENOUS
  Administered 2011-04-06: 08:00:00 via INTRAVENOUS
  Administered 2011-04-08: 20 mL/h via INTRAVENOUS
  Administered 2011-04-10 – 2011-04-13 (×3): via INTRAVENOUS

## 2011-03-31 NOTE — Progress Notes (Signed)
MEDICATION RELATED CONSULT NOTE - FOLLOW UP   Pharmacy Consult for dilantin Indication: seizures  No Known Allergies  Patient Measurements: Height: 5\' 2"  (157.5 cm) Weight: 158 lb 15.2 oz (72.1 kg) IBW/kg (Calculated) : 50.1    Vital Signs: Temp: 98.5 F (36.9 C) (01/08 1153) Temp src: Oral (01/08 1153) BP: 173/85 mmHg (01/08 1000) Pulse Rate: 90  (01/08 1119) Intake/Output from previous day: 01/07 0701 - 01/08 0700 In: 1014 [I.V.:338; NG/GT:470; IV Piggyback:156] Out: 476 [Urine:445; Emesis/NG output:30; Stool:1] Intake/Output from this shift:    Labs:  Basename 03/31/11 1116 03/31/11 0530 03/30/11 0415 03/29/11 0645 03/29/11 0055 03/28/11 1730 03/28/11 1648  WBC 23.1* 24.4* 26.3* -- -- -- --  HGB 10.8* 10.8* 11.2* -- -- -- --  HCT 32.8* 32.0* 33.3* -- -- -- --  PLT 289 300 293 -- -- -- --  APTT -- -- -- -- -- -- --  CREATININE -- 4.86* 4.73* 3.81* -- -- --  LABCREA -- -- -- -- 125.87 -- --  CREATININE -- 4.86* 4.73* 3.81* -- -- --  CREAT24HRUR -- -- -- -- -- -- --  MG -- -- 2.2 1.9 -- 1.6 --  PHOS -- 10.6* 8.9* 7.1* -- -- --  ALBUMIN -- 1.7* 1.8* 1.6* -- -- --  PROT -- -- -- -- -- -- 3.8*  ALBUMIN -- 1.7* 1.8* 1.6* -- -- --  AST -- -- -- -- -- -- 40*  ALT -- -- -- -- -- -- 16  ALKPHOS -- -- -- -- -- -- 50  BILITOT -- -- -- -- -- -- 0.1*  BILIDIR -- -- -- -- -- -- <0.1  IBILI -- -- -- -- -- -- NOT CALCULATED   Estimated Creatinine Clearance: 17 ml/min (by C-G formula based on Cr of 4.86).   Microbiology: Recent Results (from the past 720 hour(s))  CULTURE, BLOOD (ROUTINE X 2)     Status: Normal (Preliminary result)   Collection Time   03/26/11  8:20 PM      Component Value Range Status Comment   Specimen Description BLOOD ARM RIGHT   Final    Special Requests BOTTLES DRAWN AEROBIC AND ANAEROBIC 10CC   Final    Setup Time [id number]   Final    Culture     Final    Value:        BLOOD CULTURE RECEIVED NO GROWTH TO DATE CULTURE WILL BE HELD FOR 5 DAYS  BEFORE ISSUING A FINAL NEGATIVE REPORT   Report Status PENDING   Incomplete   CULTURE, BLOOD (ROUTINE X 2)     Status: Normal (Preliminary result)   Collection Time   03/26/11  8:27 PM      Component Value Range Status Comment   Specimen Description BLOOD CENTRAL LINE   Final    Special Requests     Final    Value: BOTTLES DRAWN AEROBIC AND ANAEROBIC AERO 8CC, ANAE 5CC   Setup Time [id number]   Final    Culture     Final    Value:        BLOOD CULTURE RECEIVED NO GROWTH TO DATE CULTURE WILL BE HELD FOR 5 DAYS BEFORE ISSUING A FINAL NEGATIVE REPORT   Report Status PENDING   Incomplete   MRSA PCR SCREENING     Status: Normal   Collection Time   03/26/11 11:33 PM      Component Value Range Status Comment   MRSA by PCR NEGATIVE  NEGATIVE  Final   CULTURE,  RESPIRATORY     Status: Normal   Collection Time   03/27/11  1:11 AM      Component Value Range Status Comment   Specimen Description ENDOTRACHEAL ASPIRATE   Final    Special Requests NONE   Final    Gram Stain     Final    Value: FEW WBC PRESENT, PREDOMINANTLY PMN     FEW SQUAMOUS EPITHELIAL CELLS PRESENT     RARE GRAM POSITIVE COCCI     IN PAIRS   Culture Non-Pathogenic Oropharyngeal-type Flora Isolated.   Final    Report Status 03/29/2011 FINAL   Final   PATHOLOGIST SMEAR REVIEW     Status: Normal   Collection Time   03/28/11  5:30 PM      Component Value Range Status Comment   Tech Review MARKED NORMOCHROMIC   Final     Medications:  Scheduled:    . antiseptic oral rinse  15 mL Mouth Rinse QID  . artificial tears  1 application Both Eyes Q8H  . cefTRIAXone (ROCEPHIN) IVPB 1 gram/50 mL D5W  1 g Intravenous Q24H  . chlorhexidine  15 mL Mouth Rinse BID  . famotidine (PEPCID) IV  20 mg Intravenous QHS  . heparin  5,000 Units Subcutaneous Q8H  . influenza  inactive virus vaccine  0.5 mL Intramuscular Tomorrow-1000  . insulin aspart  0-15 Units Subcutaneous Q4H  . labetalol  200 mg Per Tube BID  . phenytoin (DILANTIN) IV  100  mg Intravenous BID  . sodium bicarbonate  1,300 mg Oral TID  . sodium chloride  3 mL Intravenous Q12H  . DISCONTD: phenytoin (DILANTIN) IV  100 mg Intravenous Q8H    Assessment: 21 y/o W with no PMH presented to the [facility] ED after respiratory PE arrest. Patient had seizure 03/28/11, no further seizures have been noted. Most recent EEG was reviewed and showed no epileptiform activity and in fact showed some faster frequencies. Dilantin level this am was 12.8 which corrects to 29.1 after taking into account renal function and low albumin. Dose was drawn ~6 hours post am dose.   Goal of Therapy:  Dilantin level 10-20  Plan:  Will hold doses today Check dilantin level/albumin with am labs  Severiano Gilbert 03/31/2011,1:22 PM

## 2011-03-31 NOTE — Progress Notes (Signed)
Renal Daily Progress Note  S:Patient intububated but moving, gagging/ fighting vent some this morning.  O:BP 146/90  Pulse 97  Temp(Src) 99 F (37.2 C) (Core (Comment))  Resp 23  Ht 5\' 2"  (1.575 m)  Wt 158 lb 15.2 oz (72.1 kg)  BMI 29.07 kg/m2  SpO2 99%  LMP 03/02/2011  Intake/Output Summary (Last 24 hours) at 03/31/11 0753 Last data filed at 03/31/11 0500  Gross per 24 hour  Intake 979.75 ml  Output    456 ml  Net 523.75 ml  CVP's 8-12 Weight change: -14.1 oz (-0.4 kg) ZOX:WRUEAVWUJ but some increased movement, off sedation CVS:RRR, no murmur heard today Resp:improvement in breath sounds, still course throughout WJX:BJYNWGNFAO bs, some fullness of abdomen, no organomegaly ZHY:QMVHQ to 1+ edema, warm and 2+ pulses.    Lab 03/31/11 0530 03/30/11 0415 03/29/11 0645 03/28/11 1730 03/28/11 1648 03/28/11 1350 03/28/11 0950 03/28/11 0600 03/26/11 2131 03/26/11 2022  NA 142 137 136 137 -- 137 138 140 -- --  K 4.5 4.6 5.6* 4.5 -- 4.4 4.4 4.2 -- --  CL 111 108 107 112 -- 112 113* 115* -- --  CO2 16* 16* 15* 16* -- 17* 17* 16* -- --  GLUCOSE 112* 146* 134* 107* -- 86 78 81 -- --  BUN 56* 37* 24* 22 -- 23 22 22  -- --  CREATININE 4.86* 4.73* 3.81* 3.54* -- 3.39* 3.18* 2.93* -- --  ALB -- -- -- -- -- -- -- -- -- --  CALCIUM 7.4* 7.4* 7.5* 6.9* -- 6.9* 6.9* 6.9* -- --  PHOS 10.6* 8.9* 7.1* 5.7* -- -- -- -- -- --  AST -- -- -- -- 40* -- -- -- 53* 47*  ALT -- -- -- -- 16 -- -- -- 21 21   Liver Function Tests:  Lab 03/31/11 0530 03/30/11 0415 03/29/11 0645 03/28/11 1648 03/26/11 2131 03/26/11 2022  AST -- -- -- 40* 53* 47*  ALT -- -- -- 16 21 21   ALKPHOS -- -- -- 50 66 77  BILITOT -- -- -- 0.1* 0.2* 0.2*  PROT -- -- -- 3.8* 4.5* 5.4*  ALBUMIN 1.7* 1.8* 1.6* -- -- --    Lab 03/26/11 2131  LIPASE 29  AMYLASE 74   CBC:  Lab 03/31/11 0530 03/30/11 0415 03/29/11 0645 03/28/11 1545 03/27/11 0556  WBC 24.4* 26.3* 13.4* -- --  NEUTROABS -- -- -- 7.0 --  HGB 10.8* 11.2* 11.7* --  --  HCT 32.0* 33.3* 34.7* -- --  MCV 87.7 86.3 85.9 87.7 86.8  PLT 300 293 259 -- --   Cardiac Enzymes:  Lab 03/27/11 2200 03/27/11 1440 03/27/11 0800 03/26/11 2300  CKTOTAL 1822* 2257* 2318* 2085*  CKMB 12.1* 13.4* 13.6* 14.5*  CKMBINDEX -- -- -- --  TROPONINI <0.30 <0.30 <0.30 0.35*   CBG:  Lab 03/31/11 0327 03/30/11 2356 03/30/11 1932 03/30/11 1638 03/30/11 1141  GLUCAP 119* 137* 163* 137* 117*   ABG    Component Value Date/Time   PHART 7.313* 03/31/2011 0915   PCO2ART 34.2* 03/31/2011 0915   PO2ART 145.0* 03/31/2011 0915   HCO3 17.4* 03/31/2011 0915   TCO2 18 03/31/2011 0915   ACIDBASEDEF 8.0* 03/31/2011 0915   O2SAT 99.0 03/31/2011 0915    Renal and Serologic Work up: HIV Neg ANA Neg, DSDNA ab 22 (WNL) ASO 87 (WNL) Diract Antiglobulin complement and IgG Neg Total complement high, C3, C4 Normal PTH 250.2 (H) Kappa Light Chains 8.6 (H) Lamda Light Chains 5.8(H), Ratio 1.46 (WNL)  Pending:  SPEP/UPEP Glomerular Basement membrane Cryoglobulin  Studies/Results: PCXR 03/31/2011: Improved aeration of bilateral pulmonary edema, stable cardiomegaly  Current Medications:    . antiseptic oral rinse  15 mL Mouth Rinse QID  . artificial tears  1 application Both Eyes Q8H  . cefTRIAXone (ROCEPHIN) IVPB 1 gram/50 mL D5W  1 g Intravenous Q24H  . chlorhexidine  15 mL Mouth Rinse BID  . famotidine (PEPCID) IV  20 mg Intravenous QHS  . heparin  5,000 Units Subcutaneous Q8H  . influenza  inactive virus vaccine  0.5 mL Intramuscular Tomorrow-1000  . insulin aspart  0-15 Units Subcutaneous Q4H  . labetalol  200 mg Per Tube BID  . methylPREDNISolone (SOLU-MEDROL) injection  500 mg Intravenous Daily  . phenytoin (DILANTIN) IV  100 mg Intravenous Q8H  . sodium chloride  3 mL Intravenous Q12H      . sodium chloride 10 mL/hr at 03/28/11 2059  . sodium chloride 20 mL/hr at 03/31/11 0500  . feeding supplement (NEPRO CARB STEADY) 1,000 mL (03/29/11 1518)  . propofol 10 mcg/kg/min (03/30/11  1854)  . DISCONTD: norepinephrine (LEVOPHED) Adult infusion Stopped (03/29/11 3829)   21 year old female with cardiac arrest due to respiratory arrest, who has significant cardiomyopathy and HTN, with worsening renal failure.Marland KitchenMarland Kitchen  1) Renal Failure- creatinine slight increased to 4.8 today with slower rate of rise; abrupt bump may have been due to large BP swings from quite high (malignant hypertension) to low requiring transient pressors, with very impaired autoregulation; but some increase in UOP, nearly 500 cc/s. CVP ranges from 8-12, but peripherally edematous. Pt has proteinuria with microhematuria, very low albumin and anemia. Suggestive of systemic disease, autoimmune top of the list, but nothing in serologic workup so far to point to a specific etiology. Giving bolus steroids empirically but would not escalate therapy beyond that in abscence of a renal tissue dx.. No thrombocytopenia to suggest TTP-HUS.  No indication for RRT yet, and rate of rise much less today.  May need dialysis in next few days. Will add small dose bicarb via tube, no other new interventions renal wise. 2) Pulm - CXR continues to improve. Had white-out pulm infiltrates on presentation which is improving without diuresis or dialysis. 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  4) Heme- hemoglobin dropped, improved after 3 units PRBC's, no evidence of acute bleed, may be due to critical illness. Ordered Coomb's and hemolysis workup. No thrombocytopenia.  5) CV- Low EF in 35% range on ECHO, no focal WMA. Pt off pressors, now BP's significantly elevated, which is acceptable considering she has likely had chronic HTN for several months. Continue scheduled labetolol  per tube and IV PRN.  6) Disposition- Per CCM, patient critically ill on ventilator and concern for anoxic brain injury  Heather Sims,Heather Sims 03/31/2011 8:02 AM I have seen and examined this patient and agree with plan as outlined above with highlighted additions. Kirin Pastorino B,MD 03/31/2011 9:53 AM

## 2011-03-31 NOTE — Progress Notes (Signed)
Name: Heather Sims MRN: [id number] DOB: Nov 12, 1990  LOS: 5  CRITICAL CARE PROGRESS NOTE  History of Present Illness: 21 y/o W with no PMH presented to the [facility] ED after respiratory arrest. Had URI symptoms for the past month for which she was treated with one week of avelox two weeks prior without improvement. Also given antihypertensive for severe HTN in UC. On the night of admission she had the sudden onset of shortness of breath while out with a friend getting food. She was still talking when EMS arrived, but suddenly developed respiratory arrest after their arrival.  EMS performed CPR for PEA arrest for less than five minutes and she regained a pulse with no meds.  Upon arrival to the [facility] ED she was intubated and started on the arctic sun protocol.  She was hypertensive after arrival.  PCCM consulted for admission.  Lines / Drains: 03/26/11 ETT >> 03/26/11 R IJ CVL  03/26/11 R fem CVL >>1/4 03/26/11 L radial a-line >>  Cultures / Sepsis markers: 03/26/11 blood cx x2 >>ngtd 1/8>> 03/26/11 sputum cx >>neg 03/26/11 flu >> NEG 03/26/11 strep/leg ur ag >>neg  03/26/11 - Urine tox >>NEG  03/27/11 HIV test >>NR  Antibiotics: 03/26/11 vanc >>1/5 03/26/11 ceftriaxone >> 03/26/11 moxi >>1/5 03/26/11 tamiflu >>03/27/11  Tests / Events: 03/26/11 CT Head >>No evidence of acute intracranial hemorrhage, mass lesion, or acute Infarct. 1/4 2 D echo >The cavity size was normal. Wall thickness was increased in a pattern of moderate LVH. Systolic function was moderately reduced. The estimated ejection fraction was in the range of 35% to 40%. Wall motion was normal; 1/4 TEE >>diffuse hypokinesis, EF 40-45% 1/4 venous doppler bilaterally -NEG for VT  1/5 CT Head >>Interval left sphenoid sinusitis. Otherwise, unremarkable examination. 1/5 Renal consult for worsening renal failure -Dr. Melvia Heaps 1/5 Neuro consult >new onset Seizures   1/5 EEG >> 1/6 weaned off pressors , tranx 3 u PRBC 1/7-  Steroid bolus for possible autoimmune  cause of renal failure, neuro consulted for seizures 1/8- HTN overnight, prn labetolol started  Vital Signs:    Filed Vitals:   03/31/11 0500  BP:   Pulse: 88  Temp: 98.6 F (37 C)  Resp: 24   Vent Mode:  [-] PRVC FiO2 (%):  [29.8 %-30.3 %] 30.1 % Set Rate:  [24 bmp] 24 bmp Vt Set:  [380 mL] 380 mL PEEP:  [5 cmH20] 5 cmH20 Plateau Pressure:  [15 cmH20-25 cmH20] 15 cmH20  ABG    Component Value Date/Time   PHART 7.324* 03/31/2011 0605   PCO2ART 31.9* 03/31/2011 0605   PO2ART 201.0* 03/31/2011 0605   HCO3 16.1* 03/31/2011 0605   TCO2 17.1 03/31/2011 0605   ACIDBASEDEF 8.7* 03/31/2011 0605   O2SAT 99.6 03/31/2011 0605    Physical Examination: Gen: sedated on vent, moves all four ext, opened eyes overnight per nursing HEENT:   OP clear, ETT in place Neck: supple without masses PULM: clear bilaterally CV: RRR, no mgr, no clear JVD AB: distended, BS infrequent, soft, nontender, no hsm Ext: 1-2+ edema b/l, no clubbing, no cyanosis Derm: no rash or skin breakdown Neuro: sedated on vent  Psyche: cannot assess  Labs and Imaging:    PCXR: improved aeration  CBC    Component Value Date/Time   WBC 24.4* 03/31/2011 0530   RBC 3.65* 03/31/2011 0530   HGB 10.8* 03/31/2011 0530   HCT 32.0* 03/31/2011 0530   PLT 300 03/31/2011 0530   MCV 87.7 03/31/2011 0530   MCH 29.6  03/31/2011 0530   MCHC 33.8 03/31/2011 0530   RDW 16.0* 03/31/2011 0530   LYMPHSABS 2.1 03/28/2011 1545   MONOABS 0.7 03/28/2011 1545   EOSABS 0.3 03/28/2011 1545   BASOSABS 0.1 03/28/2011 1545    BMET    Component Value Date/Time   NA 142 03/31/2011 0530   K 4.5 03/31/2011 0530   CL 111 03/31/2011 0530   CO2 16* 03/31/2011 0530   GLUCOSE 112* 03/31/2011 0530   BUN 56* 03/31/2011 0530   CREATININE 4.86* 03/31/2011 0530   CALCIUM 7.4* 03/31/2011 0530   GFRNONAA 12* 03/31/2011 0530   GFRAA 14* 03/31/2011 0530    Assessment and Plan:  21 y/o female with no past medical history with witnessed arrest w/ immediate healthcare provided CPR. Prodrome of  Resp symptoms x 1 month prior to admission w/ associated severe HTN, swelling. On admission with  pulmonary edema and significant cardiomegaly with  acute renal failure , anemia  on admission 1/3       1. VDRF    Due to pulmonary edema on admission  +/- PNA -Asp. , possible autoimmune disorder including anti-gbm ab   - full vent support - advance ETT 3cm - repeat abg this morning - Daily ABG, - Daily SBT/WUA    2. Cardiac PEA arrest, unresponsive on arrival here 2D echo 1/4 shows LVH with EF 35%, TEE - no VSD  Pulmonary edema, anasarca  -cards signed off- no further intervention at this time - HTN- chronic, labetolol prn  3. Renal:  AKI, cause unknown. precipitous rise in creatinine. May need CVVHD. Autoimmune w/up per renal in progress. Steroid trial ends 1/8 (three days of solumedrol 500mg )  -holding lasix - steroid bolus since 1/5. Last day today -creatinine relatively stable today, no indication for dialysis today -CVVHD in 48-72 hrs ?  - renal following  4. ID : community acquired pneumonia, unclear if aspirated as part of code. Flu negative HIV neg .   - culture neg, afebrile,  -cont rocephin for now , will do 7 day course  5. Neuro  New Onset Seizure Activity (03/28/2011)   - neuro following  -On profolol, prn Ativan after dilantin load  -EEG - pending, per Dr. Doy Mince- no major seizure activity noted,   - d/c propofol today to ensure no underlying seizures - fentanyl gtt if needed after WUA - MRI brain pending  6. Heme Anemia (03/26/2011)  stable after 3 U PRBC -prbc for hgb < 8gm% -will cont on sq hep for now as if autoimmune-higher risk for dvt/pe. Monitor hbg closely   7. Other Mother updated at bedside by me today  Best practices / Disposition: ICU status on PCCM service Analgesia: fentanyl Sedation: ativan prn Thromboprophylaxis: sub q hep HOB >30 degrees Ulcer prophylaxis: famotidine Glucose control/hyperglycemia: follow cbg Diet : tube feed      CCM time: 35 minutes  Elih Mooney Pager [phone]

## 2011-03-31 NOTE — Progress Notes (Signed)
Subjective: Patient has remained on Propofol and Dilantin.  No further seizure activity has been noted.  EEG was reviewed and showed no epileptiform activity and in fact showed some faster frequencies.  Has been noted to move upper extremities by nursing.  They have also noted her to open her eyes.  When Propofol decreased yesterday seemed to localize to the vent.  Objective: Current vital signs: BP 146/90  Pulse 97  Temp(Src) 99 F (37.2 C) (Core (Comment))  Resp 23  Ht 5\' 2"  (1.575 m)  Wt 72.1 kg (158 lb 15.2 oz)  BMI 29.07 kg/m2  SpO2 99%  LMP 03/02/2011 Vital signs in last 24 hours: Temp:  [97 F (36.1 C)-99.3 F (37.4 C)] 99 F (37.2 C) (01/08 0742) Pulse Rate:  [86-107] 97  (01/08 0742) Resp:  [22-31] 23  (01/08 0742) BP: (114-177)/(66-103) 146/90 mmHg (01/08 0742) SpO2:  [57 %-100 %] 99 % (01/08 0742) Arterial Line BP: (141-213)/(71-124) 145/81 mmHg (01/08 0500) FiO2 (%):  [29.8 %-30.3 %] 30 % (01/08 0742) Weight:  [72.1 kg (158 lb 15.2 oz)] 158 lb 15.2 oz (72.1 kg) (01/08 0302)  Intake/Output from previous day: 01/07 0701 - 01/08 0700 In: 979.8 [I.V.:323.8; NG/GT:450; IV Piggyback:156] Out: 456 [Urine:425; Emesis/NG output:30; Stool:1] Intake/Output this shift:   Nutritional status:    Neurologic Exam: Mental Status:  Patient does not respond to verbal stimuli. Does not respond to deep sternal rub. Does not follow commands. No verbalizations noted.  Cranial Nerves:  II: patient does not respond confrontation bilaterally, pupils right 4 mm, left 4 mm,and reactive bilaterally  III,IV,VI: doll's response present bilaterally.  V,VII: corneal reflex present bilaterally  VIII: patient does not respond to verbal stimuli  IX,X: gag reflex unable to test, XI: trapezius strength unable to test bilaterally  XII: tongue strength unable to test  Motor:  Extremities flaccid throughout. No purposeful or spontaneous movements noted.  Sensory:  Does not respond to noxious  stimuli in any extremity.  Deep Tendon Reflexes:  1+ throughout.  Plantars:  mute bilaterally  Cerebellar:  Unable to perform    Lab Results: Results for orders placed during the hospital encounter of 03/26/11 (from the past 48 hour(s))  GLUCOSE, CAPILLARY     Status: Abnormal   Collection Time   03/29/11  8:04 AM      Component Value Range Comment   Glucose-Capillary 137 (*) 70 - 99 (mg/dL)   GLUCOSE, CAPILLARY     Status: Abnormal   Collection Time   03/29/11 11:58 AM      Component Value Range Comment   Glucose-Capillary 118 (*) 70 - 99 (mg/dL)   GLUCOSE, CAPILLARY     Status: Abnormal   Collection Time   03/29/11  3:54 PM      Component Value Range Comment   Glucose-Capillary 129 (*) 70 - 99 (mg/dL)   GLUCOSE, CAPILLARY     Status: Abnormal   Collection Time   03/29/11  7:35 PM      Component Value Range Comment   Glucose-Capillary 149 (*) 70 - 99 (mg/dL)   GLUCOSE, CAPILLARY     Status: Abnormal   Collection Time   03/29/11 11:48 PM      Component Value Range Comment   Glucose-Capillary 118 (*) 70 - 99 (mg/dL)   RENAL FUNCTION PANEL     Status: Abnormal   Collection Time   03/30/11  4:15 AM      Component Value Range Comment   Sodium 137  135 -  145 (mEq/L)    Potassium 4.6  3.5 - 5.1 (mEq/L)    Chloride 108  96 - 112 (mEq/L)    CO2 16 (*) 19 - 32 (mEq/L)    Glucose, Bld 146 (*) 70 - 99 (mg/dL)    BUN 37 (*) 6 - 23 (mg/dL) DELTA CHECK NOTED   Creatinine, Ser 4.73 (*) 0.50 - 1.10 (mg/dL)    Calcium 7.4 (*) 8.4 - 10.5 (mg/dL)    Phosphorus 8.9 (*) 2.3 - 4.6 (mg/dL)    Albumin 1.8 (*) 3.5 - 5.2 (g/dL)    GFR calc non Af Amer 12 (*) >90 (mL/min)    GFR calc Af Amer 14 (*) >90 (mL/min)   CBC     Status: Abnormal   Collection Time   03/30/11  4:15 AM      Component Value Range Comment   WBC 26.3 (*) 4.0 - 10.5 (K/uL)    RBC 3.86 (*) 3.87 - 5.11 (MIL/uL)    Hemoglobin 11.2 (*) 12.0 - 15.0 (g/dL)    HCT 16.1 (*) 09.6 - 46.0 (%)    MCV 86.3  78.0 - 100.0 (fL)    MCH 29.0   26.0 - 34.0 (pg)    MCHC 33.6  30.0 - 36.0 (g/dL)    RDW 04.5 (*) 40.9 - 15.5 (%)    Platelets 293  150 - 400 (K/uL)   MAGNESIUM     Status: Normal   Collection Time   03/30/11  4:15 AM      Component Value Range Comment   Magnesium 2.2  1.5 - 2.5 (mg/dL)   GLUCOSE, CAPILLARY     Status: Abnormal   Collection Time   03/30/11  4:22 AM      Component Value Range Comment   Glucose-Capillary 137 (*) 70 - 99 (mg/dL)   POCT I-STAT 3, BLOOD GAS (G3+)     Status: Abnormal   Collection Time   03/30/11  4:23 AM      Component Value Range Comment   pH, Arterial 7.327 (*) 7.350 - 7.400     pCO2 arterial 33.1 (*) 35.0 - 45.0 (mmHg)    pO2, Arterial 98.0  80.0 - 100.0 (mmHg)    Bicarbonate 17.2 (*) 20.0 - 24.0 (mEq/L)    TCO2 18  0 - 100 (mmol/L)    O2 Saturation 97.0      Acid-base deficit 8.0 (*) 0.0 - 2.0 (mmol/L)    Patient temperature 99.8 F      Collection site ARTERIAL LINE      Drawn by RT      Sample type ARTERIAL     GLUCOSE, CAPILLARY     Status: Abnormal   Collection Time   03/30/11  8:07 AM      Component Value Range Comment   Glucose-Capillary 126 (*) 70 - 99 (mg/dL)   GLUCOSE, CAPILLARY     Status: Abnormal   Collection Time   03/30/11 11:41 AM      Component Value Range Comment   Glucose-Capillary 117 (*) 70 - 99 (mg/dL)   GLUCOSE, CAPILLARY     Status: Abnormal   Collection Time   03/30/11  4:38 PM      Component Value Range Comment   Glucose-Capillary 137 (*) 70 - 99 (mg/dL)   GLUCOSE, CAPILLARY     Status: Abnormal   Collection Time   03/30/11  7:32 PM      Component Value Range Comment   Glucose-Capillary 163 (*) 70 - 99 (  mg/dL)    Comment 1 Documented in Chart      Comment 2 Notify RN     GLUCOSE, CAPILLARY     Status: Abnormal   Collection Time   03/30/11 11:56 PM      Component Value Range Comment   Glucose-Capillary 137 (*) 70 - 99 (mg/dL)    Comment 1 Documented in Chart      Comment 2 Notify RN     GLUCOSE, CAPILLARY     Status: Abnormal   Collection Time    03/31/11  3:27 AM      Component Value Range Comment   Glucose-Capillary 119 (*) 70 - 99 (mg/dL)    Comment 1 Documented in Chart      Comment 2 Notify RN     RENAL FUNCTION PANEL     Status: Abnormal   Collection Time   03/31/11  5:30 AM      Component Value Range Comment   Sodium 142  135 - 145 (mEq/L)    Potassium 4.5  3.5 - 5.1 (mEq/L)    Chloride 111  96 - 112 (mEq/L)    CO2 16 (*) 19 - 32 (mEq/L)    Glucose, Bld 112 (*) 70 - 99 (mg/dL)    BUN 56 (*) 6 - 23 (mg/dL) DELTA CHECK NOTED   Creatinine, Ser 4.86 (*) 0.50 - 1.10 (mg/dL)    Calcium 7.4 (*) 8.4 - 10.5 (mg/dL)    Phosphorus 16.1 (*) 2.3 - 4.6 (mg/dL)    Albumin 1.7 (*) 3.5 - 5.2 (g/dL)    GFR calc non Af Amer 12 (*) >90 (mL/min)    GFR calc Af Amer 14 (*) >90 (mL/min)   CBC     Status: Abnormal   Collection Time   03/31/11  5:30 AM      Component Value Range Comment   WBC 24.4 (*) 4.0 - 10.5 (K/uL)    RBC 3.65 (*) 3.87 - 5.11 (MIL/uL)    Hemoglobin 10.8 (*) 12.0 - 15.0 (g/dL)    HCT 09.6 (*) 04.5 - 46.0 (%)    MCV 87.7  78.0 - 100.0 (fL)    MCH 29.6  26.0 - 34.0 (pg)    MCHC 33.8  30.0 - 36.0 (g/dL)    RDW 40.9 (*) 81.1 - 15.5 (%)    Platelets 300  150 - 400 (K/uL)   BLOOD GAS, ARTERIAL     Status: Abnormal   Collection Time   03/31/11  6:05 AM      Component Value Range Comment   FIO2 0.30      Delivery systems VENTILATOR      Mode PRESSURE REGULATED VOLUME CONTROL      VT 380      Rate 24.0      Peep/cpap 5.0      pH, Arterial 7.324 (*) 7.350 - 7.400     pCO2 arterial 31.9 (*) 35.0 - 45.0 (mmHg)    pO2, Arterial 201.0 (*) 80.0 - 100.0 (mmHg)    Bicarbonate 16.1 (*) 20.0 - 24.0 (mEq/L)    TCO2 17.1  0 - 100 (mmol/L)    Acid-base deficit 8.7 (*) 0.0 - 2.0 (mmol/L)    O2 Saturation 99.6      Patient temperature 98.6      Collection site A-LINE      Drawn by COLLECTED BY NURSE      Sample type ARTERIAL DRAW       Recent Results (from the past 240 hour(s))  CULTURE, BLOOD (ROUTINE X 2)     Status: Normal  (Preliminary result)   Collection Time   03/26/11  8:20 PM      Component Value Range Status Comment   Specimen Description BLOOD ARM RIGHT   Final    Special Requests BOTTLES DRAWN AEROBIC AND ANAEROBIC 10CC   Final    Setup Time [id number]   Final    Culture     Final    Value:        BLOOD CULTURE RECEIVED NO GROWTH TO DATE CULTURE WILL BE HELD FOR 5 DAYS BEFORE ISSUING A FINAL NEGATIVE REPORT   Report Status PENDING   Incomplete   CULTURE, BLOOD (ROUTINE X 2)     Status: Normal (Preliminary result)   Collection Time   03/26/11  8:27 PM      Component Value Range Status Comment   Specimen Description BLOOD CENTRAL LINE   Final    Special Requests     Final    Value: BOTTLES DRAWN AEROBIC AND ANAEROBIC AERO 8CC, ANAE 5CC   Setup Time [id number]   Final    Culture     Final    Value:        BLOOD CULTURE RECEIVED NO GROWTH TO DATE CULTURE WILL BE HELD FOR 5 DAYS BEFORE ISSUING A FINAL NEGATIVE REPORT   Report Status PENDING   Incomplete   MRSA PCR SCREENING     Status: Normal   Collection Time   03/26/11 11:33 PM      Component Value Range Status Comment   MRSA by PCR NEGATIVE  NEGATIVE  Final   CULTURE, RESPIRATORY     Status: Normal   Collection Time   03/27/11  1:11 AM      Component Value Range Status Comment   Specimen Description ENDOTRACHEAL ASPIRATE   Final    Special Requests NONE   Final    Gram Stain     Final    Value: FEW WBC PRESENT, PREDOMINANTLY PMN     FEW SQUAMOUS EPITHELIAL CELLS PRESENT     RARE GRAM POSITIVE COCCI     IN PAIRS   Culture Non-Pathogenic Oropharyngeal-type Flora Isolated.   Final    Report Status 03/29/2011 FINAL   Final   PATHOLOGIST SMEAR REVIEW     Status: Normal   Collection Time   03/28/11  5:30 PM      Component Value Range Status Comment   Tech Review MARKED NORMOCHROMIC   Final     Lipid Panel No results found for this basename: CHOL,TRIG,HDL,CHOLHDL,VLDL,LDLCALC in the last 72 hours  Studies/Results: US Renal  03/29/2011   *RADIOLOGY REPORT*  Clinical Data: Acute on chronic renal failure  RENAL/URINARY TRACT ULTRASOUND COMPLETE  Comparison:  None.  Findings:  Right Kidney:  Measures 11.9 cm.  Echogenic renal parenchyma, likely reflecting medical renal disease.  No hydronephrosis.  Left Kidney:  Measures 12.6 cm.  Echogenic renal parenchyma, likely reflecting medical renal disease.  No hydronephrosis.  Bladder:  Decompressed by indwelling Foley catheter.  IMPRESSION: No hydronephrosis.  Echogenic renal parenchyma, likely reflecting medical renal disease.  Original Report Authenticated By: Charline Bills, M.D.   Dg Chest Port 1 View  03/31/2011  *RADIOLOGY REPORT*  Clinical Data: Endotracheal tube placement.  Intubated patient.  PORTABLE CHEST - 1 VIEW  Comparison: 03/30/2011.  Findings: Endotracheal tube 54 mm from the carina.  Right IJ central line appears unchanged.  Enteric tube is present with the tip not visualized.  Cardiomegaly.  Patient rotated to the left. Basilar atelectasis remains present.  Improved pulmonary aeration with less pulmonary edema than was present on yesterday's examination.  IMPRESSION:  1.  Stable support apparatus. 2.  Improving pulmonary aeration. 3.  Unchanged cardiomegaly.  Original Report Authenticated By: Andreas Newport, M.D.   Dg Chest Port 1 View  03/30/2011  *RADIOLOGY REPORT*  Clinical Data: Intubated.  PORTABLE CHEST - 1 VIEW  Comparison: 03/29/2011  Findings: Support devices are unchanged.  There is cardiomegaly. Bilateral lower lobe airspace opacities, left greater than right suspected small bilateral effusions.  No real change since prior study.  IMPRESSION: No interval change.  Original Report Authenticated By: Cyndie Chime, M.D.    Medications:  I have reviewed the patient's current medications. Scheduled:   . antiseptic oral rinse  15 mL Mouth Rinse QID  . artificial tears  1 application Both Eyes Q8H  . cefTRIAXone (ROCEPHIN) IVPB 1 gram/50 mL D5W  1 g Intravenous Q24H  .  chlorhexidine  15 mL Mouth Rinse BID  . famotidine (PEPCID) IV  20 mg Intravenous QHS  . heparin  5,000 Units Subcutaneous Q8H  . influenza  inactive virus vaccine  0.5 mL Intramuscular Tomorrow-1000  . insulin aspart  0-15 Units Subcutaneous Q4H  . labetalol  200 mg Per Tube BID  . methylPREDNISolone (SOLU-MEDROL) injection  500 mg Intravenous Daily  . phenytoin (DILANTIN) IV  100 mg Intravenous Q8H  . sodium chloride  3 mL Intravenous Q12H    Assessment/Plan:  Patient Active Hospital Problem List: Seizure (03/28/2011)   Assessment: No further seizure activity noted.  Remains on Dilantin.  Pharmacy managing.  EEG shows no evidence of seizure activity.     Plan: 1. Would recommend tapering off of Propofol and continuation of Dilantin.  Once off sedation can get a better idea of what her true neuro exam is like.              2. Dilantin level  *Case discussed with mother and Dr. Toy Baker   LOS: 5 days   Thana Farr, MD [facility] [phone] 03/31/2011  7:54 AM

## 2011-04-01 ENCOUNTER — Inpatient Hospital Stay (HOSPITAL_COMMUNITY): Payer: Medicaid Other

## 2011-04-01 LAB — RENAL FUNCTION PANEL
Albumin: 1.5 g/dL — ABNORMAL LOW (ref 3.5–5.2)
BUN: 59 mg/dL — ABNORMAL HIGH (ref 6–23)
CO2: 19 meq/L (ref 19–32)
Calcium: 7.7 mg/dL — ABNORMAL LOW (ref 8.4–10.5)
Chloride: 116 meq/L — ABNORMAL HIGH (ref 96–112)
Creatinine, Ser: 4.1 mg/dL — ABNORMAL HIGH (ref 0.50–1.10)
GFR calc Af Amer: 17 mL/min — ABNORMAL LOW
GFR calc non Af Amer: 15 mL/min — ABNORMAL LOW
Glucose, Bld: 86 mg/dL (ref 70–99)
Phosphorus: 8.6 mg/dL — ABNORMAL HIGH (ref 2.3–4.6)
Potassium: 4.3 meq/L (ref 3.5–5.1)
Sodium: 146 meq/L — ABNORMAL HIGH (ref 135–145)

## 2011-04-01 LAB — BLOOD GAS, ARTERIAL
Acid-base deficit: 7.5 mmol/L — ABNORMAL HIGH (ref 0.0–2.0)
Drawn by: 33176
FIO2: 0.3 %
O2 Saturation: 99.1 %
PEEP: 5 cmH2O
Patient temperature: 98.4
RATE: 18 resp/min
pO2, Arterial: 147 mmHg — ABNORMAL HIGH (ref 80.0–100.0)

## 2011-04-01 LAB — CBC
Hemoglobin: 9.8 g/dL — ABNORMAL LOW (ref 12.0–15.0)
MCV: 90.6 fL (ref 78.0–100.0)
Platelets: 270 10*3/uL (ref 150–400)
RBC: 3.39 MIL/uL — ABNORMAL LOW (ref 3.87–5.11)
WBC: 14 10*3/uL — ABNORMAL HIGH (ref 4.0–10.5)

## 2011-04-01 LAB — GLUCOSE, CAPILLARY
Glucose-Capillary: 84 mg/dL (ref 70–99)
Glucose-Capillary: 112 mg/dL — ABNORMAL HIGH (ref 70–99)
Glucose-Capillary: 93 mg/dL (ref 70–99)
Glucose-Capillary: 99 mg/dL (ref 70–99)

## 2011-04-01 LAB — PHENYTOIN LEVEL, TOTAL: Phenytoin Lvl: 9.3 ug/mL — ABNORMAL LOW (ref 10.0–20.0)

## 2011-04-01 MED ORDER — FUROSEMIDE 10 MG/ML IJ SOLN: 40.0000 mg | Freq: Four times a day (QID) | INTRAMUSCULAR | Status: DC

## 2011-04-01 MED ORDER — PHENYTOIN SODIUM 50 MG/ML IJ SOLN
100.0000 mg | Freq: Every day | INTRAMUSCULAR | Status: DC
  Administered 2011-04-01 – 2011-04-02 (×2): 100 mg via INTRAVENOUS
  Filled 2011-04-01 (×3): qty 2

## 2011-04-01 MED ORDER — FUROSEMIDE 10 MG/ML IJ SOLN
160.0000 mg | Freq: Four times a day (QID) | INTRAVENOUS | Status: AC
  Administered 2011-04-01 – 2011-04-02 (×4): 160 mg via INTRAVENOUS
  Filled 2011-04-01 (×4): qty 16

## 2011-04-01 MED ORDER — LABETALOL HCL 300 MG PO TABS
300.0000 mg | ORAL_TABLET | Freq: Two times a day (BID) | ORAL | Status: DC
  Administered 2011-04-01 (×2): 300 mg
  Filled 2011-04-01 (×4): qty 1

## 2011-04-01 NOTE — Progress Notes (Signed)
Name: Heather Sims MRN: [id number] DOB: March 22, 1991  LOS: 6  CRITICAL CARE PROGRESS NOTE  History of Present Illness: 21 y/o W with no PMH presented to the [facility] ED after respiratory arrest. Had URI symptoms for the past month for which she was treated with one week of avelox two weeks prior without improvement. Also given antihypertensive for severe HTN in UC. On the night of admission she had the sudden onset of shortness of breath while out with a friend getting food. She was still talking when EMS arrived, but suddenly developed respiratory arrest after their arrival.  EMS performed CPR for PEA arrest for less than five minutes and she regained a pulse with no meds.  Upon arrival to the [facility] ED she was intubated and started on the arctic sun protocol.  She was hypertensive after arrival.  PCCM consulted for admission.  Lines / Drains: 03/26/11 ETT >> 03/26/11 R IJ CVL  03/26/11 R fem CVL >>1/4 03/26/11 L radial a-line >>1/8  Cultures / Sepsis markers: 03/26/11 blood cx x2 >>ngtd 1/8>> 03/26/11 sputum cx >>neg 03/26/11 flu >> NEG 03/26/11 strep/leg ur ag >>neg  03/26/11 - Urine tox >>NEG  03/27/11 HIV test >>NR  Antibiotics: 03/26/11 vanc >>1/5 03/26/11 ceftriaxone >> 03/26/11 moxi >>1/5 03/26/11 tamiflu >>03/27/11  Tests / Events: 03/26/11 CT Head >>No evidence of acute intracranial hemorrhage, mass lesion, or acute Infarct. 1/4 2 D echo >The cavity size was normal. Wall thickness was increased in a pattern of moderate LVH. Systolic function was moderately reduced. The estimated ejection fraction was in the range of 35% to 40%. Wall motion was normal; 1/4 TEE >>diffuse hypokinesis, EF 40-45% 1/4 venous doppler bilaterally -NEG for VT  1/5 CT Head >>Interval left sphenoid sinusitis. Otherwise, unremarkable examination. 1/5 Renal consult for worsening renal failure -Dr. Arta Silence 1/5 Neuro consult >new onset Seizures   1/5 EEG >> 1/6 weaned off pressors , tranx 3 u PRBC 1/7-  Steroid bolus for possible autoimmune  cause of renal failure, neuro consulted for seizures 1/8- HTN overnight, prn labetolol started 1/9- Mental status much improved  Vital Signs:   Filed Vitals:   04/01/11 0500 04/01/11 0600 04/01/11 0739 04/01/11 0755  BP: 140/90 135/83    Pulse: 90 89  97  Temp: 98.2 F (36.8 C) 98.2 F (36.8 C) 98.3 F (36.8 C) 98.1 F (36.7 C)  TempSrc:   Oral   Resp: 18 18  13   Height:      Weight:      SpO2: 98% 97%  96%    Vent Mode:  [-] PRVC FiO2 (%):  [29.8 %-30.3 %] 30 % Set Rate:  [18 bmp] 18 bmp Vt Set:  [380 mL] 380 mL PEEP:  [5 cmH20] 5 cmH20 Plateau Pressure:  [17 cmH20-19 cmH20] 19 cmH20  ABG    Component Value Date/Time   PHART 7.286* 04/01/2011 0336   PCO2ART 38.9 04/01/2011 0336   PO2ART 147.0* 04/01/2011 0336   HCO3 18.0* 04/01/2011 0336   TCO2 19.2 04/01/2011 0336   ACIDBASEDEF 7.5* 04/01/2011 0336   O2SAT 99.1 04/01/2011 0336    Physical Examination: Gen: sedated, comfortable, following commands HEENT:   OP clear, ETT in place Neck: supple without masses PULM: clear bilaterally CV: RRR, no mgr, no clear JVD AB: distended, BS infrequent, soft, nontender, no hsm Ext: 1-2+ edema b/l, no clubbing, no cyanosis Derm: no rash or skin breakdown Neuro: sedated on vent, following commands Psyche: cannot assess  Labs and Imaging:    PCXR: improved  aeration  CBC    Component Value Date/Time   WBC 14.0* 04/01/2011 0540   RBC 3.39* 04/01/2011 0540   HGB 9.8* 04/01/2011 0540   HCT 30.7* 04/01/2011 0540   PLT 270 04/01/2011 0540   MCV 90.6 04/01/2011 0540   MCH 28.9 04/01/2011 0540   MCHC 31.9 04/01/2011 0540   RDW 16.4* 04/01/2011 0540   LYMPHSABS 2.1 03/28/2011 1545   MONOABS 0.7 03/28/2011 1545   EOSABS 0.3 03/28/2011 1545   BASOSABS 0.1 03/28/2011 1545    BMET    Component Value Date/Time   NA 146* 04/01/2011 0540   K 4.3 04/01/2011 0540   CL 116* 04/01/2011 0540   CO2 19 04/01/2011 0540   GLUCOSE 86 04/01/2011 0540   BUN 59* 04/01/2011 0540   CREATININE 4.10* 04/01/2011 0540   CALCIUM 7.7*  04/01/2011 0540   GFRNONAA 15* 04/01/2011 0540   GFRAA 17* 04/01/2011 0540    Assessment and Plan:  21 y/o female with no past medical history with witnessed arrest w/ immediate healthcare provided CPR. Prodrome of Resp symptoms x 1 month prior to admission w/ associated severe HTN, swelling. On admission with  pulmonary edema and significant cardiomegaly with  acute renal failure , anemia  on admission 1/3       1. VDRF    Due to pulmonary edema on admission  +/- PNA -Asp. , unclear if auto-immune mediated process in lungs or not, I doubt this is the case as CXR improved and no serologic evidence; 1/9 AM: resp mechanics are good, poor cough, still volume up, overall weak  - No extubation today - Increase tidal volume (not in ARDS), decrease sedation today - Try to improve afterload reduction and discuss diuresis with renal (would prefer to diurese some prior to extubation) - Daily ABG, - Daily SBT/WUA    2. Cardiac PEA arrest, unresponsive on arrival here 2D echo 1/4 shows LVH with EF 35%, TEE - no VSD  Pulmonary edema, anasarca  - cards signed off- no further intervention at this time - HTN- push labetalol for better afterload reduction - discuss diuresis with renal  3. Renal:  AKI, cause unknown. precipitous rise in creatinine but now improving.  Unclear if steroids mediated rise in Cr or not.  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 May need CVVHD. Autoimmune w/up per renal in progress. Steroid trial ended 1/8  - discuss diuresis with renal -creatinine improved today  - mixed acidosis: push tidal volume up (spontaneous breaths in 400-500 range) and cont bicarb via tube  - renal following  4. ID : community acquired pneumonia, unclear if aspirated as part of code. Flu negative HIV neg .   - culture neg, afebrile,  -cont rocephin for now , will do 7 day course  5. Neuro  New Onset Seizure Activity (03/28/2011); as of 1/8 no further seizure  activity Encephalopathy: much improving as of 1/8  - neuro following  - dilantin - fentanyl gtt if needed after WUA - MRI brain pending... Discuss with neurology if still needed  6. Heme Anemia (03/26/2011)  stable after 3 U PRBC -prbc for hgb < 8gm% -will cont on sq hep for now as if autoimmune-higher risk for dvt/pe. Monitor hbg closely   7. Other  Family updated at bedside by me today  Best practices / Disposition: ICU status on PCCM service Analgesia: fentanyl Sedation: ativan prn Thromboprophylaxis: sub q hep HOB >30 degrees Ulcer prophylaxis: famotidine Glucose control/hyperglycemia: follow cbg Diet :  tube feed     CCM time: 40 minutes  Max Fickle Pager [phone]

## 2011-04-01 NOTE — Progress Notes (Signed)
Pt taken to MRI with respiratory.  Pt premed with ativan 2mg  prior to MRI.   Pt's BP's 170's/100-110's during study.  Spoke with Dr. Vassie Loll 20mg  Labetolol given for BP.  Pt fentanyl drip increased during procedure to 131mcg/hr also.  Pt agitated but able to make it through study.  Returned to floor without incident.  Pt in room and calmer now, but BP's still relatively high.  Night RN given report.  Eliane Decree, RN, 04/01/2011 7:52 PM

## 2011-04-01 NOTE — Progress Notes (Signed)
MEDICATION RELATED CONSULT NOTE - FOLLOW UP   Pharmacy Consult for Dilantin Indication: Seizures   Assessment: 21 y/o W with no PMH presented to the [facility] ED after respiratory PE arrest. Patient had seizure 03/28/11, no further seizures have been noted as of 04/01/11. Most recent EEG was reviewed and showed no epileptiform activity and in fact showed some faster frequencies. MRI of brain still pending. Dilantin level this am was 9.3 which corrects to 23.3 after taking into account renal function(crcl~20) and low albumin(1.5). Last dose of dilantin was 03/31/11 ~0600. Will reduce dose according and recheck level after a steady state is achieved. ~3-5days.   Goal of Therapy:  Phenytoin level 10-20  Plan:  1.Restart phenytoin 100mg  IV QHS 2.May change to per tube next 1-2 days if patient stable 3.Recheck phenytoin level in 3-5days unless new seizures occur.   No Known Allergies   Medications:  Scheduled:    . antiseptic oral rinse  15 mL Mouth Rinse QID  . cefTRIAXone (ROCEPHIN) IVPB 1 gram/50 mL D5W  1 g Intravenous Q24H  . chlorhexidine  15 mL Mouth Rinse BID  . famotidine  20 mg Per Tube QHS  . heparin  5,000 Units Subcutaneous Q8H  . influenza  inactive virus vaccine  0.5 mL Intramuscular Tomorrow-1000  . insulin aspart  0-15 Units Subcutaneous Q4H  . labetalol  300 mg Per Tube BID  . phenytoin (DILANTIN) IV  100 mg Intravenous QHS  . sodium bicarbonate  1,300 mg Oral TID  . sodium chloride  3 mL Intravenous Q12H  . DISCONTD: artificial tears  1 application Both Eyes Q8H  . DISCONTD: famotidine (PEPCID) IV  20 mg Intravenous QHS  . DISCONTD: labetalol  200 mg Per Tube BID  . DISCONTD: phenytoin (DILANTIN) IV  100 mg Intravenous Q8H  . DISCONTD: phenytoin (DILANTIN) IV  100 mg Intravenous BID    Patient Measurements: Height: 5\' 2"  (157.5 cm) Weight: 157 lb 6.5 oz (71.4 kg) IBW/kg (Calculated) : 50.1    Vital Signs: Temp: 98.2 F (36.8 C) (01/09 0811) Temp src: Oral (01/09  0739) BP: 135/83 mmHg (01/09 0600) Pulse Rate: 95  (01/09 0811) Intake/Output from previous day: 01/08 0701 - 01/09 0700 In: 1410 [I.V.:730; NG/GT:630; IV Piggyback:50] Out: 455 [Urine:455] Intake/Output from this shift:    Labs:  Basename 04/01/11 0540 03/31/11 1116 03/31/11 0530 03/30/11 0415  WBC 14.0* 23.1* 24.4* --  HGB 9.8* 10.8* 10.8* --  HCT 30.7* 32.8* 32.0* --  PLT 270 289 300 --  APTT -- -- -- --  CREATININE 4.10* -- 4.86* 4.73*  LABCREA -- -- -- --  CREATININE 4.10* -- 4.86* 4.73*  CREAT24HRUR -- -- -- --  MG -- -- -- 2.2  PHOS 8.6* -- 10.6* 8.9*  ALBUMIN 1.5* -- 1.7* 1.8*  PROT -- -- -- --  ALBUMIN 1.5* -- 1.7* 1.8*  AST -- -- -- --  ALT -- -- -- --  ALKPHOS -- -- -- --  BILITOT -- -- -- --  BILIDIR -- -- -- --  IBILI -- -- -- --   Estimated Creatinine Clearance: 20.1 ml/min (by C-G formula based on Cr of 4.1).   Microbiology: Recent Results (from the past 720 hour(s))  CULTURE, BLOOD (ROUTINE X 2)     Status: Normal (Preliminary result)   Collection Time   03/26/11  8:20 PM      Component Value Range Status Comment   Specimen Description BLOOD ARM RIGHT   Final    Special Requests  BOTTLES DRAWN AEROBIC AND ANAEROBIC 10CC   Final    Setup Time [id number]   Final    Culture     Final    Value:        BLOOD CULTURE RECEIVED NO GROWTH TO DATE CULTURE WILL BE HELD FOR 5 DAYS BEFORE ISSUING A FINAL NEGATIVE REPORT   Report Status PENDING   Incomplete   CULTURE, BLOOD (ROUTINE X 2)     Status: Normal (Preliminary result)   Collection Time   03/26/11  8:27 PM      Component Value Range Status Comment   Specimen Description BLOOD CENTRAL LINE   Final    Special Requests     Final    Value: BOTTLES DRAWN AEROBIC AND ANAEROBIC AERO 8CC, ANAE 5CC   Setup Time [id number]   Final    Culture     Final    Value:        BLOOD CULTURE RECEIVED NO GROWTH TO DATE CULTURE WILL BE HELD FOR 5 DAYS BEFORE ISSUING A FINAL NEGATIVE REPORT   Report Status PENDING    Incomplete   MRSA PCR SCREENING     Status: Normal   Collection Time   03/26/11 11:33 PM      Component Value Range Status Comment   MRSA by PCR NEGATIVE  NEGATIVE  Final   CULTURE, RESPIRATORY     Status: Normal   Collection Time   03/27/11  1:11 AM      Component Value Range Status Comment   Specimen Description ENDOTRACHEAL ASPIRATE   Final    Special Requests NONE   Final    Gram Stain     Final    Value: FEW WBC PRESENT, PREDOMINANTLY PMN     FEW SQUAMOUS EPITHELIAL CELLS PRESENT     RARE GRAM POSITIVE COCCI     IN PAIRS   Culture Non-Pathogenic Oropharyngeal-type Flora Isolated.   Final    Report Status 03/29/2011 FINAL   Final   PATHOLOGIST SMEAR REVIEW     Status: Normal   Collection Time   03/28/11  5:30 PM      Component Value Range Status Comment   Tech Review MARKED NORMOCHROMIC   Final     Severiano Gilbert 04/01/2011,8:30 AM

## 2011-04-01 NOTE — Progress Notes (Signed)
CSW provided support and encouragement to pt and pt family at bedside today. Pt alert and interactive, wanting to drink water. Will continue to follow.   Baxter Flattery, MSW [phone]

## 2011-04-01 NOTE — Progress Notes (Signed)
Renal Daily Progress Note  S:Patient intububated but opens eyes to voice, follows commands O:BP 135/83  Pulse 89  Temp(Src) 98.2 F (36.8 C) (Core (Comment))  Resp 18  Ht 5\' 2"  (1.575 m)  Wt 157 lb 6.5 oz (71.4 kg)  BMI 28.79 kg/m2  SpO2 97%  LMP 03/02/2011  Intake/Output Summary (Last 24 hours) at 04/01/11 0700 Last data filed at 04/01/11 0600  Gross per 24 hour  Intake   1410 ml  Output    455 ml  Net    955 ml  CVP's 6-7 Weight change: -1 lb 8.7 oz (-0.7 kg) ZOX:[id number] but much more alert CVS:RRR, no m/r/g Resp:improvement in breath sounds, still course mechanically ventilated sounds Abd: normoactive bs, some fullness of abdomen, no organomegaly Ext:1+ edema, warm and 2+ pulses, warm well perfused Neuro: Pt opens eyes to voice, shakes head no to pain, turns head to look at father, squeezes fingers on commands with both hands, wiggles toes with both feet on command   Lab 04/01/11 0540 03/31/11 0530 03/30/11 0415 03/29/11 0645 03/28/11 1730 03/28/11 1648 03/28/11 1350 03/28/11 0950 03/26/11 2131 03/26/11 2022  NA 146* 142 137 136 137 -- 137 138 -- --  K 4.3 4.5 4.6 5.6* 4.5 -- 4.4 4.4 -- --  CL 116* 111 108 107 112 -- 112 113* -- --  CO2 19 16* 16* 15* 16* -- 17* 17* -- --  GLUCOSE 86 112* 146* 134* 107* -- 86 78 -- --  BUN 59* 56* 37* 24* 22 -- 23 22 -- --  CREATININE 4.10* 4.86* 4.73* 3.81* 3.54* -- 3.39* 3.18* -- --  ALB -- -- -- -- -- -- -- -- -- --  CALCIUM 7.7* 7.4* 7.4* 7.5* 6.9* -- 6.9* 6.9* -- --  PHOS 8.6* 10.6* 8.9* 7.1* 5.7* -- -- -- -- --  AST -- -- -- -- -- 40* -- -- 53* 47*  ALT -- -- -- -- -- 16 -- -- 21 21   Liver Function Tests:  Lab 04/01/11 0540 03/31/11 0530 03/30/11 0415 03/28/11 1648 03/26/11 2131 03/26/11 2022  AST -- -- -- 40* 53* 47*  ALT -- -- -- 16 21 21   ALKPHOS -- -- -- 50 66 77  BILITOT -- -- -- 0.1* 0.2* 0.2*  PROT -- -- -- 3.8* 4.5* 5.4*  ALBUMIN 1.5* 1.7* 1.8* -- -- --    Lab 03/26/11 2131  LIPASE 29  AMYLASE 74    CBC:  Lab 04/01/11 0540 03/31/11 1116 03/31/11 0530 03/30/11 0415 03/29/11 0645 03/28/11 1545  WBC 14.0* 23.1* 24.4* -- -- --  NEUTROABS -- -- -- -- -- 7.0  HGB 9.8* 10.8* 10.8* -- -- --  HCT 30.7* 32.8* 32.0* -- -- --  MCV 90.6 89.1 87.7 86.3 85.9 --  PLT 270 289 300 -- -- --   Cardiac Enzymes:  Lab 03/27/11 2200 03/27/11 1440 03/27/11 0800 03/26/11 2300  CKTOTAL 1822* 2257* 2318* 2085*  CKMB 12.1* 13.4* 13.6* 14.5*  CKMBINDEX -- -- -- --  TROPONINI <0.30 <0.30 <0.30 0.35*   CBG:  Lab 04/01/11 0335 03/31/11 2332 03/31/11 1953 03/31/11 1724 03/31/11 1200  GLUCAP 84 88 99 106* 105*   ABG    Component Value Date/Time   PHART 7.286* 04/01/2011 0336   PCO2ART 38.9 04/01/2011 0336   PO2ART 147.0* 04/01/2011 0336   HCO3 18.0* 04/01/2011 0336   TCO2 19.2 04/01/2011 0336   ACIDBASEDEF 7.5* 04/01/2011 0336   O2SAT 99.1 04/01/2011 0336    Renal and Serologic  Work up: HIV Neg ANA Neg, DSDNA ab 22 (WNL) ASO 87 (WNL) Diract Antiglobulin complement and IgG Neg Total complement high, C3, C4 Normal PTH 250.2 (H) Kappa Light Chains 8.6 (H) Lamda Light Chains 5.8(H), Ratio 1.46 (WNL) SPEP:  Total Protien 3.5 (L) Albumin 45.1 (L) Alpha 1 globulin 10.8 (H) Alpha 2 globulin 19.2 (H) Beta Globulin 5.8 Beta 2 5.1 Gamma Globulin 14.0 M Spike not detected UPEP: Tpro 33.4 Albumin Detected Alpha 1 Detected Alpha 2 Detected Beta Detected Gamma globulin Detected  Pending:  Glomerular Basement membrane Cryoglobulin  Studies/Results: PCXR 04/01/2011: Cardiomegaly, Improved aeration.   Current Medications:    . antiseptic oral rinse  15 mL Mouth Rinse QID  . cefTRIAXone (ROCEPHIN) IVPB 1 gram/50 mL D5W  1 g Intravenous Q24H  . chlorhexidine  15 mL Mouth Rinse BID  . famotidine  20 mg Per Tube QHS  . heparin  5,000 Units Subcutaneous Q8H  . influenza  inactive virus vaccine  0.5 mL Intramuscular Tomorrow-1000  . insulin aspart  0-15 Units Subcutaneous Q4H  . labetalol  200 mg Per Tube  BID  . phenytoin (DILANTIN) IV  100 mg Intravenous BID  . sodium bicarbonate  1,300 mg Oral TID  . sodium chloride  3 mL Intravenous Q12H  . DISCONTD: artificial tears  1 application Both Eyes Q8H  . DISCONTD: famotidine (PEPCID) IV  20 mg Intravenous QHS  . DISCONTD: phenytoin (DILANTIN) IV  100 mg Intravenous Q8H      . sodium chloride 20 mL/hr at 04/01/11 0130  . sodium chloride 15 mL/hr at 03/31/11 1800  . sodium chloride 10 mL/hr at 03/31/11 1900  . feeding supplement (NEPRO CARB STEADY) 1,000 mL (03/31/11 2208)  . fentaNYL infusion INTRAVENOUS 125 mcg/hr (03/31/11 1900)  . DISCONTD: propofol Stopped (03/31/11 2974)   21 year old female with cardiac arrest due to respiratory arrest, who has significant cardiomyopathy and HTN, with ?acute renal failure.Heather KitchenMarland Sims  1) Renal Failure- creatinine improved today, and UOP 450 cc's.  Encouraging that patient may recover kidney function.  At this time she does not yet need dialysis as her acidosis is stable and electrolyte abnormalities manageable with medications at this point.  Continue sodium bicarb per tube.   Abrupt bump in creatinine on 1/7 may have been due to large BP swings from quite high (malignant hypertension) to low requiring transient pressors, with very impaired autoregulation. Concern for systemic disease, autoimmune top of the list, but nothing in serologic workup so far to point to a specific etiology. Giving bolus steroids empirically but would not escalate therapy beyond that in abscence of a renal tissue dx.. No thrombocytopenia to suggest TTP-HUS.  No indication for RRT .  Will give lasix to see if we can effect an increase in UOP to help with edema. 2) Pulm - ? PNA vs non cardiogenic pulmonary edema- CXR and clinical exam continue to improve, pt now weaning on Vent, Antibiotics, ARDS protocol and vent settings per CCM  3) Neuro- drastic improvement in Neuro exam, unclear if patient suffered anoxic brain injury but today's  improvement encouraging that she may not be neurologic stable since transfusion on 03/29/11. Ordered Coomb's and hemolysis workup. No thrombocytopenia.  5) CV- BP with improved control on PO and IV labetalol.  Low EF in 35% range on ECHO, no focal WMA.  6) Disposition- Per CCM  CHAMBERLAIN,RACHEL 04/01/2011 8:24 AM I have seen and examined this patient and agree with plan as outlined by Dr. Erlene Senters with highlighted additions.Heather Sims  Yanilen Adamik B,MD 04/01/2011 9:28 AM

## 2011-04-01 NOTE — Progress Notes (Signed)
                                       [facility] PROGRESS NOTE    SUBJECTIVE   Propofol has been turned off and patient is now awake and following commands.  She is able to follow simple commands such as squeezing both hands, touching her nose, looking in all directions.  No seizure activity noted.     OBJECTIVE     Past Medical History  Diagnosis Date  . Pneumonia last 2 weeks    'walking pneumonia'    Neurologic Exam:  Mental Status: Alert,intubated Able to follow 3 step commands without difficulty. Cranial Nerves: II-Visual fields grossly intact. III/IV/VI-Extraocular movements intact.  Pupils reactive bilaterally. Nystagmus when looking horizontally (dilantin level corrected is 23) V/VII-Smile symmetric IX/X-normal gag XI-bilateral shoulder shrug Motor: bilateral UE show 4/5 strength -it does take a moment for her to initiat the movement,  As for her LE--she is able to slightly wiggle her left big toe.  When she tries to lift her legs or inittiate any leg movement, she shows quick,jerky , single myoclonic type movement.  She has no fine or sustained controle of he lower extremities.  Sensory: Pinprick and light touch intact throughout, bilaterally Deep Tendon Reflexes: 2+ and symmetric throughout Plantars: mute bilaterally Cerebellar: Normal finger-to-nose,    Medications:     Scheduled:   . antiseptic oral rinse  15 mL Mouth Rinse QID  . cefTRIAXone (ROCEPHIN) IVPB 1 gram/50 mL D5W  1 g Intravenous Q24H  . chlorhexidine  15 mL Mouth Rinse BID  . famotidine  20 mg Per Tube QHS  . furosemide  160 mg Intravenous Q6H  . heparin  5,000 Units Subcutaneous Q8H  . insulin aspart  0-15 Units Subcutaneous Q4H  . labetalol  300 mg Per Tube BID  . phenytoin (DILANTIN) IV  100 mg Intravenous QHS  . sodium bicarbonate  1,300 mg Oral TID  . sodium chloride  3 mL Intravenous Q12H  . DISCONTD: artificial tears  1 application Both Eyes Q8H  . DISCONTD: famotidine  (PEPCID) IV  20 mg Intravenous QHS  . DISCONTD: furosemide  40 mg Intravenous Q6H  . DISCONTD: labetalol  200 mg Per Tube BID  . DISCONTD: phenytoin (DILANTIN) IV  100 mg Intravenous BID    Assessment/Plan:    Patient Active Hospital Problem List:  Seizures are controlled at this time.  Her dilantin level is 23.  Am concerned that she is unable to move her LE other than quick myoclonic type movements.  We will continue to have pharmacy follow dilantin level (they have held dilantin for now given her supra therapeutic level) and order MRI brain, C-T-L spine when able.    I have discussed the following with Dr. Roseanne Reno and he is in agreement   Neuro will continue to follow.   Felicie Morn PA-C [facility] [phone]  04/01/2011, 12:07 PM

## 2011-04-02 ENCOUNTER — Inpatient Hospital Stay (HOSPITAL_COMMUNITY): Payer: Medicaid Other

## 2011-04-02 DIAGNOSIS — J9600 Acute respiratory failure, unspecified whether with hypoxia or hypercapnia: Secondary | ICD-10-CM

## 2011-04-02 DIAGNOSIS — I501 Left ventricular failure: Secondary | ICD-10-CM

## 2011-04-02 DIAGNOSIS — I10 Essential (primary) hypertension: Secondary | ICD-10-CM

## 2011-04-02 DIAGNOSIS — N179 Acute kidney failure, unspecified: Secondary | ICD-10-CM

## 2011-04-02 LAB — GLUCOSE, CAPILLARY
Glucose-Capillary: 102 mg/dL — ABNORMAL HIGH (ref 70–99)
Glucose-Capillary: 105 mg/dL — ABNORMAL HIGH (ref 70–99)
Glucose-Capillary: 119 mg/dL — ABNORMAL HIGH (ref 70–99)

## 2011-04-02 LAB — RENAL FUNCTION PANEL
Albumin: 1.7 g/dL — ABNORMAL LOW (ref 3.5–5.2)
Calcium: 8 mg/dL — ABNORMAL LOW (ref 8.4–10.5)
Creatinine, Ser: 3.43 mg/dL — ABNORMAL HIGH (ref 0.50–1.10)
GFR calc non Af Amer: 18 mL/min — ABNORMAL LOW (ref >90)
Phosphorus: 7.3 mg/dL — ABNORMAL HIGH (ref 2.3–4.6)
Sodium: 147 mEq/L — ABNORMAL HIGH (ref 135–145)

## 2011-04-02 LAB — URINE CULTURE
Colony Count: NO GROWTH
Culture  Setup Time: 201301101704

## 2011-04-02 LAB — CBC
MCH: 29.6 pg (ref 26.0–34.0)
MCHC: 32.7 g/dL (ref 30.0–36.0)
Platelets: 264 10*3/uL (ref 150–400)
RDW: 15.7 % — ABNORMAL HIGH (ref 11.5–15.5)

## 2011-04-02 LAB — POCT I-STAT 3, ART BLOOD GAS (G3+)
Acid-base deficit: 4 mmol/L — ABNORMAL HIGH (ref 0.0–2.0)
Bicarbonate: 22 mEq/L (ref 20.0–24.0)
pO2, Arterial: 125 mmHg — ABNORMAL HIGH (ref 80.0–100.0)

## 2011-04-02 LAB — CULTURE, BLOOD (ROUTINE X 2): Culture  Setup Time: 201301101707

## 2011-04-02 MED ORDER — NITROGLYCERIN 2 % TD OINT
1.0000 [in_us] | TOPICAL_OINTMENT | Freq: Four times a day (QID) | TRANSDERMAL | Status: DC | PRN
  Administered 2011-04-02 (×2): 1 [in_us] via TOPICAL
  Filled 2011-04-02: qty 30

## 2011-04-02 MED ORDER — LABETALOL HCL 200 MG PO TABS
400.0000 mg | ORAL_TABLET | Freq: Two times a day (BID) | ORAL | Status: DC
  Administered 2011-04-02: 400 mg
  Filled 2011-04-02 (×4): qty 2

## 2011-04-02 MED ORDER — HYDRALAZINE HCL 20 MG/ML IJ SOLN
INTRAMUSCULAR | Status: AC
  Filled 2011-04-02: qty 1

## 2011-04-02 MED ORDER — FUROSEMIDE 10 MG/ML IJ SOLN
40.0000 mg | Freq: Four times a day (QID) | INTRAMUSCULAR | Status: DC
  Administered 2011-04-02: 40 mg via INTRAVENOUS
  Filled 2011-04-02 (×3): qty 4

## 2011-04-02 MED ORDER — LABETALOL HCL 5 MG/ML IV SOLN
20.0000 mg | INTRAVENOUS | Status: DC | PRN
  Administered 2011-04-02 – 2011-04-07 (×25): 20 mg via INTRAVENOUS
  Filled 2011-04-02 (×25): qty 4

## 2011-04-02 MED ORDER — HYDRALAZINE HCL 20 MG/ML IJ SOLN
10.0000 mg | Freq: Four times a day (QID) | INTRAMUSCULAR | Status: DC | PRN
  Administered 2011-04-02 – 2011-04-04 (×5): 10 mg via INTRAVENOUS
  Filled 2011-04-02 (×6): qty 1

## 2011-04-02 MED ORDER — FUROSEMIDE 10 MG/ML IJ SOLN
160.0000 mg | Freq: Three times a day (TID) | INTRAVENOUS | Status: AC
  Administered 2011-04-02 – 2011-04-03 (×3): 160 mg via INTRAVENOUS
  Filled 2011-04-02 (×3): qty 16

## 2011-04-02 MED ORDER — SODIUM CHLORIDE 0.9 % IV SOLN
25.0000 mg | Freq: Once | INTRAVENOUS | Status: AC
  Administered 2011-04-02: 25 mg via INTRAVENOUS
  Filled 2011-04-02: qty 1

## 2011-04-02 MED ORDER — POTASSIUM CHLORIDE 10 MEQ/50ML IV SOLN
10.0000 meq | INTRAVENOUS | Status: AC
  Administered 2011-04-02 (×4): 10 meq via INTRAVENOUS
  Filled 2011-04-02: qty 150

## 2011-04-02 NOTE — Progress Notes (Signed)
Name: Heather Sims MRN: [id number] DOB: 1990/06/17  LOS: 7  CRITICAL CARE PROGRESS NOTE  History of Present Illness: 21 y/o W with no PMH presented to the [facility] ED after respiratory arrest. Had URI symptoms for the past month for which she was treated with one week of avelox two weeks prior without improvement. Also given antihypertensive for severe HTN in UC. On the night of admission she had the sudden onset of shortness of breath while out with a friend getting food. She was still talking when EMS arrived, but suddenly developed respiratory arrest after their arrival.  EMS performed CPR for PEA arrest for less than five minutes and she regained a pulse with no meds.  Upon arrival to the [facility] ED she was intubated and started on the arctic sun protocol.  She was hypertensive after arrival.  PCCM consulted for admission.  Lines / Drains: 03/26/11 ETT >> 03/26/11 R IJ CVL  03/26/11 R fem CVL >>1/4 03/26/11 L radial a-line >>1/8  Cultures / Sepsis markers: 03/26/11 blood cx x2 >>ngtd 1/8>> 03/26/11 sputum cx >>neg 03/26/11 flu >> NEG 03/26/11 strep/leg ur ag >>neg  03/26/11 - Urine tox >>NEG  03/27/11 HIV test >>NR  Antibiotics: 03/26/11 vanc >>1/5 03/26/11 ceftriaxone >> 04/02/11 03/26/11 moxi >>1/5 03/26/11 tamiflu >>03/27/11  Tests / Events: 03/26/11 CT Head >>No evidence of acute intracranial hemorrhage, mass lesion, or acute Infarct. 1/4 2 D echo >The cavity size was normal. Wall thickness was increased in a pattern of moderate LVH. Systolic function was moderately reduced. The estimated ejection fraction was in the range of 35% to 40%. Wall motion was normal; 1/4 TEE >>diffuse hypokinesis, EF 40-45% 1/4 venous doppler bilaterally -NEG for VT  1/5 CT Head >>Interval left sphenoid sinusitis. Otherwise, unremarkable examination. 1/5 Renal consult for worsening renal failure -Dr. Melvia Heaps 1/5 Neuro consult >new onset Seizures   1/5 EEG >> minimal seizure activity 1/5 MPO Ab neg, PR-3 Ab neg, C-ANCA neg, P-ANCA neg,  ANA negative 1/6 weaned off pressors , tranx 3 u PRBC 1/7-  Steroid bolus for possible autoimmune cause of renal failure, neuro consulted for seizures 1/8- HTN overnight, prn labetolol started 1/9- Mental status much improved 1/9- MRI Brain  Vital Signs:   Filed Vitals:   04/02/11 0500 04/02/11 0530 04/02/11 0600 04/02/11 0700  BP: 174/107 171/101 157/90 157/91  Pulse: 109 107 99 99  Temp: 100.8 F (38.2 C) 100.9 F (38.3 C) 100.8 F (38.2 C) 100.2 F (37.9 C)  TempSrc:      Resp: 16 15 15 15   Height:      Weight: 70.2 kg (154 lb 12.2 oz)     SpO2: 99% 99% 98% 98%    Vent Mode:  [-] CPAP FiO2 (%):  [29.9 %-100 %] 30 % Set Rate:  [12 bmp] 12 bmp Vt Set:  [450 mL] 450 mL PEEP:  [5 cmH20] 5 cmH20 Pressure Support:  [5 cmH20-15 cmH20] 10 cmH20 Plateau Pressure:  [15 cmH20-22 cmH20] 19 cmH20  ABG    Component Value Date/Time   PHART 7.311* 04/02/2011 0431   PCO2ART 44.1 04/02/2011 0431   PO2ART 125.0* 04/02/2011 0431   HCO3 22.0 04/02/2011 0431   TCO2 23 04/02/2011 0431   ACIDBASEDEF 4.0* 04/02/2011 0431   O2SAT 98.0 04/02/2011 0431    Physical Examination: Gen: sedated, comfortable, following commands HEENT:   OP clear, ETT in place Neck: supple without masses PULM: clear bilaterally CV: RRR, no mgr, no clear JVD AB: distended, BS infrequent, soft, nontender, no  hsm Ext: 1-2+ edema b/l, no clubbing, no cyanosis Derm: no rash or skin breakdown Neuro: sedated on vent, following commands Psyche: cannot assess  Labs and Imaging:    PCXR: improved aeration  CBC    Component Value Date/Time   WBC 13.4* 04/02/2011 0515   RBC 3.41* 04/02/2011 0515   HGB 10.1* 04/02/2011 0515   HCT 30.9* 04/02/2011 0515   PLT 264 04/02/2011 0515   MCV 90.6 04/02/2011 0515   MCH 29.6 04/02/2011 0515   MCHC 32.7 04/02/2011 0515   RDW 15.7* 04/02/2011 0515   LYMPHSABS 2.1 03/28/2011 1545   MONOABS 0.7 03/28/2011 1545   EOSABS 0.3 03/28/2011 1545   BASOSABS 0.1 03/28/2011 1545    BMET      Component Value Date/Time   NA 147* 04/02/2011 0515   K 4.1 04/02/2011 0515   CL 114* 04/02/2011 0515   CO2 21 04/02/2011 0515   GLUCOSE 94 04/02/2011 0515   BUN 58* 04/02/2011 0515   CREATININE 3.43* 04/02/2011 0515   CALCIUM 8.0* 04/02/2011 0515   GFRNONAA 18* 04/02/2011 0515   GFRAA 21* 04/02/2011 0515    Assessment and Plan:  21 y/o female with no past medical history with witnessed arrest w/ immediate healthcare provided CPR. Prodrome of Resp symptoms x 1 month prior to admission w/ associated severe HTN, swelling. On admission with  pulmonary edema and significant cardiomegaly with  acute renal failure , anemia  on admission 1/3       1. VDRF    Due to pulmonary edema on admission  +/- PNA -Asp. ; unclear if auto-immune mediated process in lungs or not, I doubt this is the case as CXR improved and no serologic evidence;  1/10 AM: minimal secretions, CXR and ABG improved, weak cough  - diurese now, better bp control now, then likely extubation later this AM - add nitropaste for better BP control, may need hydralazine depending on HR - Daily ABG - Daily SBT/WUA    2. Cardiac PEA arrest, unresponsive on arrival here 2D echo 1/4 shows LVH with EF 35%, TEE - no VSD  Pulmonary edema, anasarca,  - cards signed off- no further intervention at this time - HTN- push labetalol for better afterload reduction - diurese again today - add nitroglycerine paste for bp control today - consider hydralazline  3. Renal:  AKI, cause unknown. precipitous rise in creatinine but now improving.  Unclear if steroids mediated rise in Cr or not.  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 Autoimmune w/up per renal in progress (so far all negative, but haven't seen anti-GBM result yet). Steroid trial ended 1/8  - discuss diuresis with renal -creatinine improved today  - mixed acidosis: push tidal volume up (spontaneous breaths in 400-500 range) and cont bicarb via tube   - renal following  4. ID : community acquired pneumonia, unclear if aspirated as part of code. Flu negative HIV neg .   - culture neg, afebrile  - d/c ceftriaxone today  5. Neuro  New Onset Seizure Activity (03/28/2011); as of 1/8 no further seizure activity Encephalopathy: much improving as of 1/8  - neuro following  - dilantin - fentanyl gtt prn - MRI brain pending... Discuss with neurology if still needed  6. Heme Anemia (03/26/2011)  stable after 3 U PRBC on 03/28/11 -prbc for hgb < 8gm% -will cont on sq hep for now as if autoimmune-higher risk for dvt/pe. Monitor hbg closely   7. Other  Family (mom) updated  at bedside by me today  Best practices / Disposition: ICU status on PCCM service Analgesia: fentanyl Sedation: ativan prn Thromboprophylaxis: sub q hep HOB >30 degrees Ulcer prophylaxis: famotidine Glucose control/hyperglycemia: follow cbg Diet : tube feed     CCM time: 35 minutes  Simonne Maffucci Pager [phone]

## 2011-04-02 NOTE — Progress Notes (Signed)
Nutrition Follow-up  Propofol off. Pt extubated this morning. Remains NPO. Per RN, TF on hold to assess swallow function soon.  Diet Order:  NPO  Re-estimated Nutritional Needs: Kcal: 1700 - 1900 kcal Protein: 80 - 95 grams  Meds: Scheduled Meds:   . antiseptic oral rinse  15 mL Mouth Rinse QID  . chlorhexidine  15 mL Mouth Rinse BID  . famotidine  20 mg Per Tube QHS  . furosemide  160 mg Intravenous Q6H  . furosemide  160 mg Intravenous Q8H  . heparin  5,000 Units Subcutaneous Q8H  . insulin aspart  0-15 Units Subcutaneous Q4H  . labetalol  400 mg Per Tube BID  . phenytoin (DILANTIN) IV  100 mg Intravenous QHS  . potassium chloride  10 mEq Intravenous Q1 Hr x 4  . sodium bicarbonate  1,300 mg Oral TID  . sodium chloride  3 mL Intravenous Q12H  . DISCONTD: cefTRIAXone (ROCEPHIN) IVPB 1 gram/50 mL D5W  1 g Intravenous Q24H  . DISCONTD: furosemide  40 mg Intravenous Q6H  . DISCONTD: labetalol  300 mg Per Tube BID   Continuous Infusions:   . sodium chloride 10 mL/hr at 04/02/11 0039  . sodium chloride 15 mL/hr at 03/31/11 1800  . sodium chloride Stopped (04/02/11 0039)  . feeding supplement (NEPRO CARB STEADY) 1,000 mL (03/31/11 2208)  . fentaNYL infusion INTRAVENOUS Stopped (04/02/11 1037)   PRN Meds:.sodium chloride, sodium chloride, fentaNYL, hydrALAZINE, labetalol, LORazepam, nitroGLYCERIN, sodium chloride, DISCONTD: labetalol  Labs:  CMP     Component Value Date/Time   NA 147* 04/02/2011 0515   K 4.1 04/02/2011 0515   CL 114* 04/02/2011 0515   CO2 21 04/02/2011 0515   GLUCOSE 94 04/02/2011 0515   BUN 58* 04/02/2011 0515   CREATININE 3.43* 04/02/2011 0515   CALCIUM 8.0* 04/02/2011 0515   PROT 3.8* 03/28/2011 1648   ALBUMIN 1.7* 04/02/2011 0515   AST 40* 03/28/2011 1648   ALT 16 03/28/2011 1648   ALKPHOS 50 03/28/2011 1648   BILITOT 0.1* 03/28/2011 1648   GFRNONAA 18* 04/02/2011 0515   GFRAA 21* 04/02/2011 0515  Phosphorus 7.3H   Intake/Output Summary (Last 24 hours) at  04/02/11 1142 Last data filed at 04/02/11 1142  Gross per 24 hour  Intake 1197.39 ml  Output   3330 ml  Net -2132.61 ml   Weight Status:  70.2 kg, wt down 2.3 kg x 3 days  Nutrition Dx:  Inadequate oral intake - persists.  Goal:  EN to meet >90% estimated nutrition needs. D/c this goal. New goal: Pt to transition to oral diet. Pt to consume >/= 75% of meals.  Intervention:   1. Diet per MD and SLP. 2. RD to follow nutrition care plan.   Monitor: weights, labs, advancement to diet, I/O's  Adair Laundry Pager #:  [phone]

## 2011-04-02 NOTE — Progress Notes (Signed)
                                       [facility] PROGRESS [facility]    SUBJECTIVE   Patient remains intubated.  Again can follow commands but has a delayed response when trying to move her upper limbs.  Continues to have inability to move her LE's other than quick, myoclonic type jerking movement. After she attempts to move her legs, she continues to have myoclonic twitching of her lower extremities which is not voluntary. MRI  C-T-L sine all show normal.  MRI brain shows no infarct.   OBJECTIVE     Past Medical History  Diagnosis Date  . Pneumonia last 2 weeks    'walking pneumonia'    Neurologic Exam:  Mental Status:  Alert,intubated Able to follow 3 step commands without difficulty.  Cranial Nerves:  II-Visual fields grossly intact.  III/IV/VI-Extraocular movements intact. Pupils reactive bilaterally. V/VII-Smile symmetric  IX/X-normal gag  XI-bilateral shoulder shrug  Motor: bilateral UE show 4/5 strength -it does take a moment for her to initiat the movement.  She sems to [facility] more proximal weakness in the upper extremities as she has a hard time with shoulder abd and FF. As for her LE--she is able to slightly wiggle her left/right toes minimally. When she tries to lift her legs or inittiate any leg movement, she continues to show quick,jerky , brisk low amplitude myoclonic type movement. She has no fine or sustained controle of he lower extremities.  Sensory: Pinprick and light touch intact throughout, bilaterally  Deep Tendon Reflexes: 3+ in the lower extremity with 3-4 beats clonus noted in the left ankle after ankle jerk. symmetric throughout  Plantars: mute bilaterally  Cerebellar: Normal finger-to-nose.   Lab Results: Lab Results  Component Value Date/Time   CHOL 296* 03/26/2011 11:00 PM   Studies/Results:   Medications:     Scheduled:   . antiseptic oral rinse  15 mL Mouth Rinse QID  . chlorhexidine  15 mL Mouth Rinse BID  . famotidine  20 mg Per  Tube QHS  . furosemide  160 mg Intravenous Q6H  . furosemide  40 mg Intravenous Q6H  . heparin  5,000 Units Subcutaneous Q8H  . insulin aspart  0-15 Units Subcutaneous Q4H  . labetalol  400 mg Per Tube BID  . phenytoin (DILANTIN) IV  100 mg Intravenous QHS  . potassium chloride  10 mEq Intravenous Q1 Hr x 4  . sodium bicarbonate  1,300 mg Oral TID  . sodium chloride  3 mL Intravenous Q12H  . DISCONTD: cefTRIAXone (ROCEPHIN) IVPB 1 gram/50 mL D5W  1 g Intravenous Q24H  . DISCONTD: labetalol  300 mg Per Tube BID    Assessment/Plan:   Patient Active Hospital Problem List:  Seizures are controlled at this time. We will continue to have pharmacy follow dilantin level   MRI brain, C-T-L spine show no abnormality.   Recommend: PT/OT for continued therapy while in the hospital.  At this time I have discussed the following with Dr. Roseanne Reno.  We have no further recommendations.  Please feel free to call with any questions.       Felicie Morn PA-C [facility] [phone]  04/02/2011, 9:47 AM

## 2011-04-02 NOTE — Progress Notes (Signed)
eLink Physician-Brief Progress Note Patient Name: Heather Sims DOB: 08-24-1990 MRN: [id number]  Date of Service  04/02/2011   HPI/Events of Note     eICU Interventions  Thorazine IV x 1 fro hiccups x 1 hr   Intervention Category Minor Interventions: Routine modifications to care plan (e.g. PRN medications for pain, fever)  ALVA,RAKESH V. 04/02/2011, 9:05 PM

## 2011-04-02 NOTE — Procedures (Signed)
Extubation Procedure Note  Patient Details:   Name: Heather Sims DOB: [birth year] MRN: [id number]   Airway Documentation:  AIRWAYS 8 mm (Active)  Secured at (cm) 22 cm 04/01/2011  8:00 PM  Measured From Lips 04/01/2011  8:00 PM  Secured Location Right 04/01/2011  8:00 PM  Secured By [employer] 04/01/2011  8:00 PM  Site Condition Dry 04/01/2011  8:00 PM     Airway 7.5 mm (Active)  Secured at (cm) 22 cm 04/02/2011  7:35 AM  Measured From Lips 04/02/2011  7:35 AM  Secured Location Right 04/02/2011  7:35 AM  Secured By [employer] 04/02/2011  7:35 AM  Tube Holder Repositioned Yes 04/02/2011  7:35 AM  Cuff Pressure (cm H2O) 22 cm H2O 04/02/2011  7:35 AM  Site Condition Dry 04/02/2011  4:09 AM    Evaluation  O2 sats: stable throughout Complications: No apparent complications Patient did tolerate procedure well. Bilateral Breath Sounds: Clear Suctioning: Airway No: pt can't vocalize, but can answer appropriately. MD aware.  Devra Dopp [facility] 04/02/2011, 11:08 AM

## 2011-04-02 NOTE — Progress Notes (Signed)
Renal Daily Progress Note  S:Patient intububated but awake and following commands O:BP 157/90  Pulse 99  Temp(Src) 100.8 F (38.2 C) (Core (Comment))  Resp 15  Ht 5\' 2"  (1.575 m)  Wt 154 lb 12.2 oz (70.2 kg)  BMI 28.31 kg/m2  SpO2 98%  LMP 03/02/2011  Intake/Output Summary (Last 24 hours) at 04/02/11 0648 Last data filed at 04/02/11 0600  Gross per 24 hour  Intake 1477.02 ml  Output   2690 ml  Net -1212.98 ml  CVP's 8-13 Weight change: -2 lb 10.3 oz (-1.2 kg) ZOX:[id number] but awake CVS:RRR, no m/r/g Resp:good breath sounds, still course mechanically ventilated sounds Abd: normoactive bs, some fullness of abdomen, no organomegaly Ext:1+ edema, warm and 2+ pulses, warm well perfused Neuro: Pt opens eyes to voice, follows commands, is interactive  Lab 04/02/11 0515 04/01/11 0540 03/31/11 0530 03/30/11 0415 03/29/11 0645 03/28/11 1730 03/28/11 1648 03/28/11 1350 03/26/11 2131 03/26/11 2022  NA 147* 146* 142 137 136 137 -- 137 -- --  K 4.1 4.3 4.5 4.6 5.6* 4.5 -- 4.4 -- --  CL 114* 116* 111 108 107 112 -- 112 -- --  CO2 21 19 16* 16* 15* 16* -- 17* -- --  GLUCOSE 94 86 112* 146* 134* 107* -- 86 -- --  BUN 58* 59* 56* 37* 24* 22 -- 23 -- --  CREATININE 3.43* 4.10* 4.86* 4.73* 3.81* 3.54* -- 3.39* -- --  ALB -- -- -- -- -- -- -- -- -- --  CALCIUM 8.0* 7.7* 7.4* 7.4* 7.5* 6.9* -- 6.9* -- --  PHOS 7.3* 8.6* 10.6* 8.9* 7.1* 5.7* -- -- -- --  AST -- -- -- -- -- -- 40* -- 53* 47*  ALT -- -- -- -- -- -- 16 -- 21 21   Liver Function Tests:  Lab 04/02/11 0515 04/01/11 0540 03/31/11 0530 03/28/11 1648 03/26/11 2131 03/26/11 2022  AST -- -- -- 40* 53* 47*  ALT -- -- -- 16 21 21   ALKPHOS -- -- -- 50 66 77  BILITOT -- -- -- 0.1* 0.2* 0.2*  PROT -- -- -- 3.8* 4.5* 5.4*  ALBUMIN 1.7* 1.5* 1.7* -- -- --    Lab 03/26/11 2131  LIPASE 29  AMYLASE 74   CBC:  Lab 04/02/11 0515 04/01/11 0540 03/31/11 1116 03/31/11 0530 03/30/11 0415 03/28/11 1545  WBC 13.4* 14.0* 23.1* -- -- --    NEUTROABS -- -- -- -- -- 7.0  HGB 10.1* 9.8* 10.8* -- -- --  HCT 30.9* 30.7* 32.8* -- -- --  MCV 90.6 90.6 89.1 87.7 86.3 --  PLT 264 270 289 -- -- --   Cardiac Enzymes:  Lab 03/27/11 2200 03/27/11 1440 03/27/11 0800 03/26/11 2300  CKTOTAL 1822* 2257* 2318* 2085*  CKMB 12.1* 13.4* 13.6* 14.5*  CKMBINDEX -- -- -- --  TROPONINI <0.30 <0.30 <0.30 0.35*   CBG:  Lab 04/02/11 0326 04/01/11 2304 04/01/11 1926 04/01/11 1546 04/01/11 1208  GLUCAP 102* 105* 99 93 112*   ABG    Component Value Date/Time   PHART 7.311* 04/02/2011 0431   PCO2ART 44.1 04/02/2011 0431   PO2ART 125.0* 04/02/2011 0431   HCO3 22.0 04/02/2011 0431   TCO2 23 04/02/2011 0431   ACIDBASEDEF 4.0* 04/02/2011 0431   O2SAT 98.0 04/02/2011 0431    Renal and Serologic Work up: HIV Neg ANA Neg, DSDNA ab 22 (WNL) ASO 87 (WNL) Diract Antiglobulin complement and IgG Neg Total complement high, C3, C4 Normal PTH 250.2 (H) Kappa Light Chains 8.6 (  H) Lamda Light Chains 5.8(H), Ratio 1.46 (WNL) ANCA Negative SPEP:  Total Protien 3.5 (L) Albumin 45.1 (L) Alpha 1 globulin 10.8 (H) Alpha 2 globulin 19.2 (H) Beta Globulin 5.8 Beta 2 5.1 Gamma Globulin 14.0 M Spike not detected UPEP: Tpro 33.4 Albumin Detected Alpha 1 Detected Alpha 2 Detected Beta Detected Gamma globulin Detected  Pending:  Glomerular Basement membrane Cryoglobulin  Studies/Results: PCXR 04/02/2011: Cardiomegaly, No significant airspace opacities.   Current Medications:    . antiseptic oral rinse  15 mL Mouth Rinse QID  . cefTRIAXone (ROCEPHIN) IVPB 1 gram/50 mL D5W  1 g Intravenous Q24H  . chlorhexidine  15 mL Mouth Rinse BID  . famotidine  20 mg Per Tube QHS  . furosemide  160 mg Intravenous Q6H  . heparin  5,000 Units Subcutaneous Q8H  . insulin aspart  0-15 Units Subcutaneous Q4H  . labetalol  300 mg Per Tube BID  . phenytoin (DILANTIN) IV  100 mg Intravenous QHS  . sodium bicarbonate  1,300 mg Oral TID  . sodium chloride  3 mL  Intravenous Q12H  . DISCONTD: furosemide  40 mg Intravenous Q6H  . DISCONTD: labetalol  200 mg Per Tube BID  . DISCONTD: phenytoin (DILANTIN) IV  100 mg Intravenous BID      . sodium chloride 10 mL/hr at 04/02/11 0039  . sodium chloride 15 mL/hr at 03/31/11 1800  . sodium chloride Stopped (04/02/11 0039)  . feeding supplement (NEPRO CARB STEADY) 1,000 mL (03/31/11 2208)  . fentaNYL infusion INTRAVENOUS 50 mcg/hr (04/02/11 254)   21 year old female with cardiac arrest due to respiratory arrest, who has significant cardiomyopathy and HTN, with ?acute renal failure.Marland KitchenMarland Kitchen   1) Renal Failure- creatinine decreased and robust lasix response, UOP nearly 2700 cc's with 160 Q6H of Lasix yesterday - will decrease some to 160 tid and continue to assess  Encouraging that patient may recover kidney function.  Acidosis is also improving, and I do not expect she will need dialysis.  Continue sodium bicarb per tube.  Abrupt bump in creatinine on 1/7 may have been due to large BP swings from quite high (malignant hypertension) to low requiring transient pressors, with very impaired autoregulation. There was  concern for systemic disease, autoimmune top of the list, but nothing in serologic workup so far to point to a specific etiology. Steroid trial now d/c'd. No thrombocytopenia to suggest TTP-HUS.  No indication for RRT. Can consider renal biopsy when stable as patient continues to improve if persistent proteinuria, evidince of nephrotic proteinuria or active sediment, or failure to return to normal renal function.  2) Pulm - ? PNA vs non cardiogenic pulmonary edema- CXR clear, possibility of vent wean now, Antibiotics, ARDS protocol and vent settings per CCM  3) Neuro- drastic improvement in Neuro exam, unclear if patient suffered anoxic brain injury but today's improvement encouraging.  Neuro has ordered MRI, awaiting results.  4) Heme- hemoglobinstable since transfusion on 03/29/11. Ordered Coomb's and hemolysis  workup. No thrombocytopenia.  5) CV- HTN and tachycardia is acceptably controlled on PO and IV labetalol.  Low EF in 35% range on ECHO, no focal WMA. 6) Disposition- Per CCM  CHAMBERLAIN,RACHEL 04/02/2011 I have seen and examined this patient and agree with plan as outlined by Dr. Erlene Senters with highlighted additions. Zane Samson B,MD 04/02/2011 10:01 AM  6:48 AM

## 2011-04-03 ENCOUNTER — Inpatient Hospital Stay (HOSPITAL_COMMUNITY): Payer: Medicaid Other

## 2011-04-03 LAB — GLUCOSE, CAPILLARY
Glucose-Capillary: 102 mg/dL — ABNORMAL HIGH (ref 70–99)
Glucose-Capillary: 108 mg/dL — ABNORMAL HIGH (ref 70–99)
Glucose-Capillary: 97 mg/dL (ref 70–99)
Glucose-Capillary: 97 mg/dL (ref 70–99)

## 2011-04-03 LAB — RENAL FUNCTION PANEL
BUN: 57 mg/dL — ABNORMAL HIGH (ref 6–23)
CO2: 22 mEq/L (ref 19–32)
Chloride: 112 mEq/L (ref 96–112)
GFR calc Af Amer: 27 mL/min — ABNORMAL LOW (ref >90)
Glucose, Bld: 97 mg/dL (ref 70–99)
Potassium: 4 mEq/L (ref 3.5–5.1)
Sodium: 144 mEq/L (ref 135–145)

## 2011-04-03 LAB — CBC
Hemoglobin: 9.7 g/dL — ABNORMAL LOW (ref 12.0–15.0)
MCH: 29.6 pg (ref 26.0–34.0)
Platelets: 234 10*3/uL (ref 150–400)
RBC: 3.28 MIL/uL — ABNORMAL LOW (ref 3.87–5.11)
WBC: 15.1 10*3/uL — ABNORMAL HIGH (ref 4.0–10.5)

## 2011-04-03 LAB — POCT I-STAT 3, ART BLOOD GAS (G3+)
Acid-base deficit: 3 mmol/L — ABNORMAL HIGH (ref 0.0–2.0)
Bicarbonate: 22.4 mEq/L (ref 20.0–24.0)
Bicarbonate: 24.2 mEq/L — ABNORMAL HIGH (ref 20.0–24.0)
Patient temperature: 37.3
pCO2 arterial: 40.4 mmHg (ref 35.0–45.0)
pH, Arterial: 7.352 (ref 7.350–7.400)
pH, Arterial: 7.42 — ABNORMAL HIGH (ref 7.350–7.400)
pO2, Arterial: 129 mmHg — ABNORMAL HIGH (ref 80.0–100.0)

## 2011-04-03 MED ORDER — VALPROATE SODIUM 500 MG/5ML IV SOLN
500.0000 mg | Freq: Two times a day (BID) | INTRAVENOUS | Status: DC
  Administered 2011-04-03 – 2011-04-04 (×2): 500 mg via INTRAVENOUS
  Filled 2011-04-03 (×5): qty 5

## 2011-04-03 MED ORDER — HYDRALAZINE HCL 20 MG/ML IJ SOLN
10.0000 mg | Freq: Three times a day (TID) | INTRAMUSCULAR | Status: DC
  Administered 2011-04-03 – 2011-04-04 (×3): 10 mg via INTRAVENOUS
  Filled 2011-04-03 (×2): qty 1
  Filled 2011-04-03 (×3): qty 0.5

## 2011-04-03 MED ORDER — SODIUM CHLORIDE 0.9 % IJ SOLN
10.0000 mL | INTRAMUSCULAR | Status: DC | PRN
  Administered 2011-04-10 – 2011-04-13 (×7): 10 mL

## 2011-04-03 MED ORDER — CLONAZEPAM 0.5 MG PO TABS
0.5000 mg | ORAL_TABLET | Freq: Four times a day (QID) | ORAL | Status: DC
  Administered 2011-04-03 (×3): 0.5 mg via ORAL
  Filled 2011-04-03 (×3): qty 1

## 2011-04-03 MED ORDER — SODIUM CHLORIDE 0.9 % IV SOLN
25.0000 mg | Freq: Once | INTRAVENOUS | Status: AC
  Administered 2011-04-03: 25 mg via INTRAVENOUS
  Filled 2011-04-03: qty 1

## 2011-04-03 MED ORDER — METOPROLOL TARTRATE 1 MG/ML IV SOLN
5.0000 mg | Freq: Four times a day (QID) | INTRAVENOUS | Status: DC
  Administered 2011-04-03 – 2011-04-04 (×4): 5 mg via INTRAVENOUS
  Filled 2011-04-03 (×8): qty 5

## 2011-04-03 MED ORDER — DEXTROSE 5 % IV SOLN
1000.0000 mg | Freq: Once | INTRAVENOUS | Status: AC
  Administered 2011-04-03: 1000 mg via INTRAVENOUS
  Filled 2011-04-03 (×2): qty 10

## 2011-04-03 MED ORDER — BIOTENE DRY MOUTH MT LIQD
15.0000 mL | Freq: Two times a day (BID) | OROMUCOSAL | Status: DC
  Administered 2011-04-04 – 2011-04-13 (×18): 15 mL via OROMUCOSAL

## 2011-04-03 MED ORDER — FUROSEMIDE 10 MG/ML IJ SOLN
160.0000 mg | Freq: Two times a day (BID) | INTRAVENOUS | Status: AC
  Administered 2011-04-03 – 2011-04-04 (×3): 160 mg via INTRAVENOUS
  Filled 2011-04-03 (×3): qty 16

## 2011-04-03 MED ORDER — SODIUM CHLORIDE 0.9 % IJ SOLN
10.0000 mL | Freq: Two times a day (BID) | INTRAMUSCULAR | Status: DC
  Administered 2011-04-03 – 2011-04-10 (×9): 10 mL

## 2011-04-03 MED ORDER — LORAZEPAM 2 MG/ML IJ SOLN
2.0000 mg | INTRAMUSCULAR | Status: DC | PRN
  Administered 2011-04-03 – 2011-04-04 (×2): 2 mg via INTRAVENOUS
  Filled 2011-04-03 (×2): qty 1

## 2011-04-03 MED ORDER — NITROGLYCERIN 2 % TD OINT
1.0000 [in_us] | TOPICAL_OINTMENT | Freq: Four times a day (QID) | TRANSDERMAL | Status: DC
  Administered 2011-04-03 – 2011-04-04 (×4): 1 [in_us] via TOPICAL

## 2011-04-03 NOTE — Progress Notes (Signed)
UR Completed.  Shataria Crist Jane [phone] 04/03/2011  

## 2011-04-03 NOTE — Progress Notes (Signed)
Name: Heather Sims MRN: [id number] DOB: 04-30-1990  LOS: 8  CRITICAL CARE PROGRESS NOTE  History of Present Illness: 21 y/o W with no PMH presented to the [facility] ED after respiratory arrest. Had URI symptoms for the past month for which she was treated with one week of avelox two weeks prior without improvement. Also given antihypertensive for severe HTN in UC. On the night of admission she had the sudden onset of shortness of breath while out with a friend getting food. She was still talking when EMS arrived, but suddenly developed respiratory arrest after their arrival.  EMS performed CPR for PEA arrest for less than five minutes and she regained a pulse with no meds.  Upon arrival to the [facility] ED she was intubated and started on the arctic sun protocol.  She was hypertensive after arrival.  PCCM consulted for admission.  Lines / Drains: 03/26/11 ETT >> 03/26/11 R IJ CVL  03/26/11 R fem CVL >>1/4 03/26/11 L radial a-line >>1/8  Cultures / Sepsis markers: 03/26/11 blood cx x2 >>ngtd 1/8>> 03/26/11 sputum cx >>neg 03/26/11 flu >> NEG 03/26/11 strep/leg ur ag >>neg  03/26/11 - Urine tox >>NEG  03/27/11 HIV test >>NR 1/10 blood >> 1/10 urine >>  Antibiotics: 03/26/11 vanc >>1/5 03/26/11 ceftriaxone >> 04/02/11 03/26/11 moxi >>1/5 03/26/11 tamiflu >>03/27/11  Tests / Events: 03/26/11 CT Head >>No evidence of acute intracranial hemorrhage, mass lesion, or acute Infarct. 1/4 2 D echo >The cavity size was normal. Wall thickness was increased in a pattern of moderate LVH. Systolic function was moderately reduced. The estimated ejection fraction was in the range of 35% to 40%. Wall motion was normal; 1/4 TEE >>diffuse hypokinesis, EF 40-45% 1/4 venous doppler bilaterally -NEG for VT  1/5 CT Head >>Interval left sphenoid sinusitis. Otherwise, unremarkable examination. 1/5 Renal consult for worsening renal failure -Dr. Melvia Heaps 1/5 Neuro consult >new onset Seizures   1/5 EEG >> minimal seizure activity 1/5 MPO Ab neg, PR-3 Ab  neg, C-ANCA neg, P-ANCA neg, ANA negative 1/6 weaned off pressors , tranx 3 u PRBC 1/7-  Steroid bolus for possible autoimmune cause of renal failure, neuro consulted for seizures 1/8- HTN overnight, prn labetolol started 1/9- Mental status much improved 1/9- MRI Brain and C/T/L spine: normal neuro structures, some maxillary sinus fluid 1/10- lots of hiccups vs. Myoclonic jerking overnight 1/11- witness generalized seizure in AM < 73minute  Vital Signs:   Filed Vitals:   04/03/11 0700 04/03/11 0800 04/03/11 0806 04/03/11 0900  BP: 162/92 153/97    Pulse: 108 94  101  Temp: 99 F (37.2 C) 98.8 F (37.1 C) 98.7 F (37.1 C) 99 F (37.2 C)  TempSrc:   Oral   Resp: 25 29  27   Height:      Weight:      SpO2: 98% 95%  97%    Vent Mode:  [-] CPAP FiO2 (%):  [30 %] 30 % PEEP:  [5 cmH20] 5 cmH20 Pressure Support:  [5 cmH20] 5 cmH20  ABG    Component Value Date/Time   PHART 7.352 04/03/2011 0335   PCO2ART 40.4 04/03/2011 0335   PO2ART 129.0* 04/03/2011 0335   HCO3 22.4 04/03/2011 0335   TCO2 24 04/03/2011 0335   ACIDBASEDEF 3.0* 04/03/2011 0335   O2SAT 99.0 04/03/2011 0335    Physical Examination: Gen: lots of jerking activity followed by brief generalized seizure HEENT:   OP clear, perrl Neck: supple without masses PULM: crackles in bases CV: RRR, no mgr, no clear JVD  AB: distended, BS infrequent, soft, nontender, no hsm Ext: 1-2+ edema b/l, no clubbing, no cyanosis Derm: no rash or skin breakdown Neuro: post ictal, prior was following commands Psyche: cannot assess  Labs and Imaging:    PCXR: improved aeration  CBC    Component Value Date/Time   WBC 15.1* 04/03/2011 0450   RBC 3.28* 04/03/2011 0450   HGB 9.7* 04/03/2011 0450   HCT 29.5* 04/03/2011 0450   PLT 234 04/03/2011 0450   MCV 89.9 04/03/2011 0450   MCH 29.6 04/03/2011 0450   MCHC 32.9 04/03/2011 0450   RDW 15.2 04/03/2011 0450   LYMPHSABS 2.1 03/28/2011 1545   MONOABS 0.7 03/28/2011 1545   EOSABS 0.3 03/28/2011 1545     BASOSABS 0.1 03/28/2011 1545    BMET    Component Value Date/Time   NA 144 04/03/2011 0450   K 4.0 04/03/2011 0450   CL 112 04/03/2011 0450   CO2 22 04/03/2011 0450   GLUCOSE 97 04/03/2011 0450   BUN 57* 04/03/2011 0450   CREATININE 2.80* 04/03/2011 0450   CALCIUM 8.1* 04/03/2011 0450   GFRNONAA 23* 04/03/2011 0450   GFRAA 27* 04/03/2011 0450    Assessment and Plan:  21 y/o female with no past medical history with witnessed arrest w/ immediate healthcare provided CPR. Prodrome of Resp symptoms x 1 month prior to admission w/ associated severe HTN, swelling. On admission with  pulmonary edema and significant cardiomegaly with  acute renal failure , anemia  on admission 1/3       1. VDRF    Most likely due to pulmonary edema related to hypertensive nephropathy and cardiomyopathy  - diurese again today, will schedule IV BP meds given seizure this morning - nitropaste scheduled for better BP control, scheduled hydralazine   2. Cardiac PEA arrest likely due to pulmonary edema 2D echo 1/4 shows LVH with EF 35%, TEE - no VSD  Pulmonary edema, anasarca, slowly improving  - cards signed off- no further intervention at this time - HTN- scheduled IV metoprolol, hydralazine and nitropaste  - diurese again today   3. Renal:  AKI, cause unknown. precipitous rise in creatinine but now improving.  Unclear if steroids mediated rise in Cr or not.  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 Autoimmune w/up per renal in progress (so far all negative, but haven't seen anti-GBM result yet). Steroid trial ended 1/8  - diurese  -creatinine improved today  - renal following  4. ID : community acquired pneumonia, unclear if aspirated as part of code. Flu negative HIV neg .   - s/p 7 days of ceftriaxone  - change line to PICC today given elevated WBC  5. Neuro  New Onset Seizure Activity (03/28/2011); as of 1/8 no further seizure activity Encephalopathy: much  improving as of 1/8 1/11: worsening jerks overnight, presumed to be myoclonic vs. Hiccups: developed generalized seizure witnessed by me on rounds, favor jerks are seizure activity  - neuro to re-evaluate seizure activity today - dilantin, check level today (discuss with pharmacy) - MRI brain normal  6. Heme Anemia (03/26/2011)  stable after 3 U PRBC on 03/28/11 -prbc for hgb < 8gm% -will cont on sq hep for now as if autoimmune-higher risk for dvt/pe. Monitor hbg closely   7. Diet:  Given seizures, make npo until neurology sees her, but order speech eval for later today  Family (dad) updated at bedside by me today  Best practices / Disposition: ICU status on PCCM service Analgesia:  off  Sedation: ativan prn Thromboprophylaxis: sub q hep HOB >30 degrees Ulcer prophylaxis: famotidine Glucose control/hyperglycemia: follow cbg Diet : tube feed     CCM time: 45 minutes  Heather Sims Pager [phone]

## 2011-04-03 NOTE — Progress Notes (Signed)
Subjective: Grand mal seizure, preceded by focal, and generalized mild clonus. Has been on Dilantin 100 mg at bedtime.  Objective: Current vital signs: BP 184/116  Pulse 99  Temp(Src) 99.1 F (37.3 C) (Oral)  Resp 24  Ht 5\' 2"  (1.575 m)  Wt 69.4 kg (153 lb)  BMI 27.98 kg/m2  SpO2 100%  LMP 03/02/2011  I Neurologic Exam: Patient is currently sedated and minimally responsive to verbal and tactile stimulation. Pupils were equal and reacted normally to light. Extraocular movements were intact to oculocephalic maneuvers. Face was symmetric. Muscle tone was flaccid throughout. She had no spontaneous movements. Withdrawal movements to noxious stimuli were equal. Deep tendon reflexes were normal and symmetrical.  Studies/Results:   Medications:  Scheduled:   . antiseptic oral rinse  15 mL Mouth Rinse QID  . chlorhexidine  15 mL Mouth Rinse BID  . chlorproMAZINE (THORAZINE) IV  25 mg Intravenous Once  . chlorproMAZINE (THORAZINE) IV  25 mg Intravenous Once  . furosemide  160 mg Intravenous Q8H  . furosemide  160 mg Intravenous BID  . heparin  5,000 Units Subcutaneous Q8H  . hydrALAZINE  10 mg Intravenous Q8H  . insulin aspart  0-15 Units Subcutaneous Q4H  . metoprolol  5 mg Intravenous Q6H  . nitroGLYCERIN  1 inch Topical Q6H  . potassium chloride  10 mEq Intravenous Q1 Hr x 4  . sodium chloride  3 mL Intravenous Q12H  . valproate sodium  1,000 mg Intravenous Once  . valproate sodium  500 mg Intravenous Q12H  . DISCONTD: famotidine  20 mg Per Tube QHS  . DISCONTD: labetalol  400 mg Per Tube BID  . DISCONTD: phenytoin (DILANTIN) IV  100 mg Intravenous QHS  . DISCONTD: sodium bicarbonate  1,300 mg Oral TID    Assessment/Plan: 1. Recurrent generalized seizure with associated focal as well as generalized myoclonic seizure activity. 2. Bilateral lower extremity weakness of unclear etiology, with no indications of cervical or thoracic myelopathy per MRI studies. He was also no  evidence of intracranial etiology for bilateral lower extremity weakness per her MRI study.  Plan: 1 discontinue Dilantin 2 Depacon 1000 mg IV loading dose followed by maintenance dose of Depacon 500 mg every 12 hours. 3. Depakote level in the a.m.  C.R. Roseanne Reno, MD [facility]   04/03/2011  10:34 AM

## 2011-04-03 NOTE — Progress Notes (Signed)
Renal Daily Progress Note  S:Patient is sleeping, her dad says she just fell asleep, hiccups have been keeping her up all night.  O:BP 167/97  Pulse 100  Temp(Src) 99.3 F (37.4 C) (Core (Comment))  Resp 26  Ht 5\' 2"  (1.575 m)  Wt 153 lb (69.4 kg)  BMI 27.98 kg/m2  SpO2 99%  LMP 03/02/2011  Intake/Output Summary (Last 24 hours) at 04/03/11 0657 Last data filed at 04/03/11 0600  Gross per 24 hour  Intake 1085.5 ml  Output   3465 ml  Net -2379.5 ml  CVP's 4-8 Weight change: -1 lb 12.2 oz (-0.8 kg) Gen: extubated, asleep, but hiccupping  CVS:RRR, no m/r/g Resp:clear breat sounds throughout.  Abd: normoactive bs, soft Ext: trace to 1+ edema with slight improvement from yesterday, warm and 2+ pulses, warm well perfused Neuro: I did not wake patient to do neuro exam due to her father's request to let her sleep.    Lab 04/03/11 0450 04/02/11 0515 04/01/11 0540 03/31/11 0530 03/30/11 0415 03/29/11 0645 03/28/11 1730 03/28/11 1648  NA 144 147* 146* 142 137 136 137 --  K 4.0 4.1 4.3 4.5 4.6 5.6* 4.5 --  CL 112 114* 116* 111 108 107 112 --  CO2 22 21 19  16* 16* 15* 16* --  GLUCOSE 97 94 86 112* 146* 134* 107* --  BUN 57* 58* 59* 56* 37* 24* 22 --  CREATININE 2.80* 3.43* 4.10* 4.86* 4.73* 3.81* 3.54* --  ALB -- -- -- -- -- -- -- --  CALCIUM 8.1* 8.0* 7.7* 7.4* 7.4* 7.5* 6.9* --  PHOS 5.4* 7.3* 8.6* 10.6* 8.9* 7.1* 5.7* --  AST -- -- -- -- -- -- -- 40*  ALT -- -- -- -- -- -- -- 16   Liver Function Tests:  Lab 04/03/11 0450 04/02/11 0515 04/01/11 0540 03/28/11 1648  AST -- -- -- 40*  ALT -- -- -- 16  ALKPHOS -- -- -- 50  BILITOT -- -- -- 0.1*  PROT -- -- -- 3.8*  ALBUMIN 1.7* 1.7* 1.5* --   No results found for this basename: LIPASE:3,AMYLASE:3 in the last 168 hours CBC:  Lab 04/03/11 0450 04/02/11 0515 04/01/11 0540 03/31/11 1116 03/31/11 0530 03/28/11 1545  WBC 15.1* 13.4* 14.0* -- -- --  NEUTROABS -- -- -- -- -- 7.0  HGB 9.7* 10.1* 9.8* -- -- --  HCT 29.5* 30.9* 30.7*  -- -- --  MCV 89.9 90.6 90.6 89.1 87.7 --  PLT 234 264 270 -- -- --   Cardiac Enzymes:  Lab 03/27/11 2200 03/27/11 1440 03/27/11 0800  CKTOTAL 1822* 2257* 2318*  CKMB 12.1* 13.4* 13.6*  CKMBINDEX -- -- --  TROPONINI <0.30 <0.30 <0.30   CBG:  Lab 04/03/11 0350 04/03/11 0015 04/02/11 1915 04/02/11 1631 04/02/11 1209  GLUCAP 108* 102* 119* 105* 100*   ABG    Component Value Date/Time   PHART 7.352 04/03/2011 0335   PCO2ART 40.4 04/03/2011 0335   PO2ART 129.0* 04/03/2011 0335   HCO3 22.4 04/03/2011 0335   TCO2 24 04/03/2011 0335   ACIDBASEDEF 3.0* 04/03/2011 0335   O2SAT 99.0 04/03/2011 0335    Renal and Serologic Work up: (negative to date) HIV Neg ANA Neg, DSDNA ab 22 (WNL) ASO 29 (WNL) Diract Antiglobulin complement and IgG Neg Total complement high, C3, C4 Normal PTH 250.2 (H) Kappa Light Chains 8.6 (H) Lamda Light Chains 5.8(H), Ratio 1.46 (WNL) ANCA Negative SPEP:  Total Protien 3.5 (L) Albumin 45.1 (L) Alpha 1 globulin 10.8 (H)  Alpha 2 globulin 19.2 (H) Beta Globulin 5.8 Beta 2 5.1 Gamma Globulin 14.0 M Spike not detected UPEP: Tpro 33.4 Albumin Detected Alpha 1 Detected Alpha 2 Detected Beta Detected Gamma globulin Detected  Pending:  Glomerular Basement membrane Cryoglobulin  Studies/Results: PCXR 04/03/2011: Cardiomegaly, No significant airspace opacities.   Current Medications:    . antiseptic oral rinse  15 mL Mouth Rinse QID  . chlorhexidine  15 mL Mouth Rinse BID  . chlorproMAZINE (THORAZINE) IV  25 mg Intravenous Once  . chlorproMAZINE (THORAZINE) IV  25 mg Intravenous Once  . famotidine  20 mg Per Tube QHS  . furosemide  160 mg Intravenous Q8H  . heparin  5,000 Units Subcutaneous Q8H  . insulin aspart  0-15 Units Subcutaneous Q4H  . labetalol  400 mg Per Tube BID  . phenytoin (DILANTIN) IV  100 mg Intravenous QHS  . potassium chloride  10 mEq Intravenous Q1 Hr x 4  . sodium bicarbonate  1,300 mg Oral TID  . sodium chloride  3 mL  Intravenous Q12H  . DISCONTD: cefTRIAXone (ROCEPHIN) IVPB 1 gram/50 mL D5W  1 g Intravenous Q24H  . DISCONTD: furosemide  40 mg Intravenous Q6H  . DISCONTD: labetalol  300 mg Per Tube BID      . sodium chloride 10 mL/hr at 04/02/11 0039  . sodium chloride 15 mL/hr at 03/31/11 1800  . sodium chloride Stopped (04/02/11 0039)  . feeding supplement (NEPRO CARB STEADY) 1,000 mL (03/31/11 2208)  . DISCONTD: fentaNYL infusion INTRAVENOUS Stopped (04/02/11 1054)   21 year old female with cardiac arrest due to respiratory arrest, who has significant cardiomyopathy and HTN, with ?acute renal failure, now extubated, reportedly good neuro status when awake, and recovering renal function:   1) Renal Failure- creatinine decreased to 2.8, continues to have good response to lasix response, UOP nearly 3400 cc's with 160 TID of Lasix yesterday - will decrease some to 160 BID and continue to assess  Encouraging that patient will most likely recover kidney function.  Acidosis is also improving, and I do not expect she will need dialysis.  Continue sodium bicarb per tube, but CO2 coming up slowly, may be able to d/c as Cr. normalizes.   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  As blood pressure is controlled, proteinuria may resolve.  There was  concern for systemic disease, autoimmune top of the list, but nothing in serologic workup so far to point to a specific etiology. Steroid trial now d/c'd. No thrombocytopenia to suggest TTP-HUS.  No indication for RRT. Can consider renal biopsy when stable  if persistent proteinuria, evidince of nephrotic proteinuria or active sediment, or failure to return to normal renal function in setting of controlled HTN.  2) Pulm - ? PNA vs non cardiogenic pulmonary edema- CXR clear, extubated yesterday.  3) Neuro- continue dilantin.  4) Heme- hemoglobinstable since transfusion on 03/29/11. Ordered Coomb's and  hemolysis workup. No thrombocytopenia.  5) CV- HTN and tachycardia improved with PO and IV labetalol, but still significant.  Low EF in 35% range on ECHO, no focal WMA, may normalize with diuresis, bp control. 6)  Hiccups- patient now with significant jerking hiccups, has been given 2 doses of IV thorazine, but still hiccupping.  She is now able to rest.  Father and nursing deny any mental status changes, gaze abnormalities with hiccups. Continue Thorazine as needed, monitor for seizure activity. Since this note written has had generalized  tonic clonic seizure per RN that lasted a minute and then subsided.  On Dilantin - should get free dilantin level; neuro to followup. 6) Disposition- Per CCM  [facility] 04/03/2011 6:57 AM I have seen and examined this patient and agree with plan  As outlined by Dr. Erlene Senters.  Renal wise she continues to slowly improve, is very diuretic responsive.  Will back down a bit on the lasix today and continue to follow.  Criteria for further "invasive" renal workup as defined in note.  Serologic evaluation all negative to date. Gleason Ardoin B,MD 04/03/2011 9:52 AM

## 2011-04-03 NOTE — Progress Notes (Signed)
Attempted to get pt. To chair.  Sat on edge of bed, weak head and torso support. Continued w/notable clonic jerking movements of extremeties. Upon attempt to stand, pt. Could not bear weight on legs and began full body seizure. Dr. Kendrick Fries present. Seizure lasted approx. 1-1.5 minutes.

## 2011-04-03 NOTE — Plan of Care (Signed)
Problem: Phase III Progression Outcomes Goal: OOB with assistance Outcome: Not Progressing Attempted to get pt. OOB. She could not bear any weight, nor maintain upright posture. Had seizure during attempt.

## 2011-04-03 NOTE — Progress Notes (Signed)
Dr. Craige Cotta made aware of elevated RR w/decreased SpO2 along w/ con't issue trying to keep SBP less than 160 inspite of receiving scheduled and PRN meds.

## 2011-04-03 NOTE — Progress Notes (Signed)
eLink Physician-Brief Progress Note Patient Name: Heather Sims DOB: Feb 20, 1991 MRN: [id number]  Date of Service  04/03/2011   HPI/Events of Note  Patient with recurrent hiccups - thorazine 25 mg administered earlier with some relief  eICU Interventions  Plan: 25 mg thorazine IV times now dose now   Intervention Category Minor Interventions: Routine modifications to care plan (e.g. PRN medications for pain, fever)  Hang Ammon 04/03/2011, 3:56 AM

## 2011-04-03 NOTE — Progress Notes (Signed)
Left arm that PICC was inserted in is swollen entire length including hand prior to insertion.  Nathaneil Canary, RN, [facility],  IV Team.

## 2011-04-03 NOTE — Progress Notes (Signed)
Pupils at 5mm and reactive bilaterally. Pt. Able to weakly open eyes to name call, but closes back.  No further jerking motions noted.

## 2011-04-03 NOTE — Progress Notes (Signed)
SLP Cancellation Note  Unable to complete SLP evaluation/treatment secondary to patient lethargic and continues to have intermittent mild seizures per RN. Plan to f/u in am 1/12 if patient stable.   Therapist:  Ferdinand Lango MA, CCC-SLP [phone]

## 2011-04-04 ENCOUNTER — Inpatient Hospital Stay (HOSPITAL_COMMUNITY): Payer: Medicaid Other

## 2011-04-04 LAB — RENAL FUNCTION PANEL
Albumin: 1.7 g/dL — ABNORMAL LOW (ref 3.5–5.2)
Calcium: 8.6 mg/dL (ref 8.4–10.5)
GFR calc Af Amer: 30 mL/min — ABNORMAL LOW (ref >90)
Glucose, Bld: 80 mg/dL (ref 70–99)
Phosphorus: 5.8 mg/dL — ABNORMAL HIGH (ref 2.3–4.6)
Potassium: 4.5 mEq/L (ref 3.5–5.1)
Sodium: 140 mEq/L (ref 135–145)

## 2011-04-04 LAB — POCT I-STAT 3, ART BLOOD GAS (G3+)
Acid-base deficit: 2 mmol/L (ref 0.0–2.0)
O2 Saturation: 96 %

## 2011-04-04 LAB — CBC
MCH: 29.5 pg (ref 26.0–34.0)
MCV: 90.2 fL (ref 78.0–100.0)
Platelets: 290 10*3/uL (ref 150–400)
RDW: 14.7 % (ref 11.5–15.5)
WBC: 21.3 10*3/uL — ABNORMAL HIGH (ref 4.0–10.5)

## 2011-04-04 LAB — VALPROIC ACID LEVEL: Valproic Acid Lvl: 13.4 ug/mL — ABNORMAL LOW (ref 50.0–100.0)

## 2011-04-04 MED ORDER — VALPROATE SODIUM 500 MG/5ML IV SOLN
750.0000 mg | Freq: Two times a day (BID) | INTRAVENOUS | Status: DC
  Administered 2011-04-04 (×2): 750 mg via INTRAVENOUS
  Filled 2011-04-04 (×4): qty 7.5

## 2011-04-04 MED ORDER — HYDRALAZINE HCL 20 MG/ML IJ SOLN
10.0000 mg | INTRAMUSCULAR | Status: DC | PRN
  Administered 2011-04-04: 20 mg via INTRAVENOUS
  Administered 2011-04-04: 10 mg via INTRAVENOUS
  Administered 2011-04-05: 30 mg via INTRAVENOUS
  Administered 2011-04-05: 20 mg via INTRAVENOUS
  Administered 2011-04-05: 40 mg via INTRAVENOUS
  Administered 2011-04-06 – 2011-04-08 (×4): 20 mg via INTRAVENOUS
  Filled 2011-04-04 (×5): qty 1
  Filled 2011-04-04: qty 2

## 2011-04-04 MED ORDER — METOPROLOL TARTRATE 25 MG PO TABS
25.0000 mg | ORAL_TABLET | Freq: Two times a day (BID) | ORAL | Status: DC
  Administered 2011-04-04 (×2): 25 mg via ORAL
  Filled 2011-04-04 (×2): qty 1

## 2011-04-04 MED ORDER — METOPROLOL TARTRATE 50 MG PO TABS
50.0000 mg | ORAL_TABLET | Freq: Two times a day (BID) | ORAL | Status: DC
  Administered 2011-04-04 – 2011-04-06 (×4): 50 mg via ORAL
  Filled 2011-04-04 (×5): qty 1

## 2011-04-04 MED ORDER — SODIUM CHLORIDE 0.9 % IV SOLN
25.0000 mg | Freq: Once | INTRAVENOUS | Status: AC
  Administered 2011-04-04: 25 mg via INTRAVENOUS
  Filled 2011-04-04: qty 1

## 2011-04-04 MED ORDER — VALPROATE SODIUM 500 MG/5ML IV SOLN
500.0000 mg | Freq: Once | INTRAVENOUS | Status: AC
  Administered 2011-04-04: 500 mg via INTRAVENOUS
  Filled 2011-04-04: qty 5

## 2011-04-04 MED ORDER — CLONAZEPAM 0.5 MG PO TABS
0.5000 mg | ORAL_TABLET | Freq: Four times a day (QID) | ORAL | Status: DC
  Administered 2011-04-04 – 2011-04-06 (×8): 0.5 mg via ORAL
  Filled 2011-04-04 (×8): qty 1

## 2011-04-04 MED ORDER — HYDRALAZINE HCL 20 MG/ML IJ SOLN
10.0000 mg | Freq: Four times a day (QID) | INTRAMUSCULAR | Status: DC
  Administered 2011-04-04 – 2011-04-05 (×5): 10 mg via INTRAVENOUS
  Filled 2011-04-04 (×3): qty 0.5
  Filled 2011-04-04: qty 1
  Filled 2011-04-04: qty 2
  Filled 2011-04-04: qty 0.5

## 2011-04-04 MED ORDER — LORAZEPAM 2 MG/ML IJ SOLN: 2.0000 mg | INTRAMUSCULAR | Status: DC | PRN

## 2011-04-04 NOTE — Progress Notes (Signed)
Name: Heather Sims MRN: [id number] DOB: 07/24/1990  LOS: 9  CRITICAL CARE PROGRESS NOTE  PT PROFILE: 21 y/o W with no PMH presented via EMS to the [facility] ED 1/03 after cardiorespiratory arrest - sudden onset of SOB followed by PEA arrest and 5 minutes of pulselessness. Had recently been diagnosed with hypertension in the weeks prior to admission.  Underwent hypothermia protocol. Suffered seizures upon rewarming  Lines / Drains: 03/26/11 ETT >> 1/10 03/26/11 R IJ CVL >> 1/11 03/26/11 R fem CVL >>1/4 03/26/11 L radial a-line >>1/8 04/03/11 PICC >>   Cultures / Sepsis markers: 03/26/11 blood cx x2 >>neg 03/26/11 sputum cx >>neg 03/26/11 flu >> NEG 03/26/11 strep/leg ur ag >>neg  03/26/11 - Urine tox >>NEG  03/27/11 HIV test >>NR 1/10 blood >> ngtd 1/12 >> 1/10 urine >> NEG  Antibiotics: 03/26/11 vanc >>1/5 03/26/11 ceftriaxone >> 04/02/11 03/26/11 moxi >>1/5 03/26/11 tamiflu >>03/27/11  Tests / Events: 03/26/11 CT Head >>No evidence of acute intracranial hemorrhage, mass lesion, or acute Infarct. 1/4 2 D echo >The cavity size was normal. Wall thickness was increased in a pattern of moderate LVH. Systolic function was moderately reduced. The estimated ejection fraction was in the range of 35% to 40%. Wall motion was normal; 1/4 TEE >>diffuse hypokinesis, EF 40-45% 1/4 venous doppler bilaterally -NEG for VT  1/5 CT Head >>Interval left sphenoid sinusitis. Otherwise, unremarkable examination. 1/5 Renal consult for worsening renal failure -Dr. Arta Silence 1/5 Neuro consult >new onset Seizures   1/5 EEG >> minimal seizure activity 1/5 MPO Ab neg, PR-3 Ab neg, C-ANCA neg, P-ANCA neg, ANA negative 1/6 weaned off pressors , tranx 3 u PRBC 1/7-  Steroid bolus for possible autoimmune cause of renal failure, neuro consulted for seizures 1/9- MRI Brain and C/T/L spine: normal neuro structures 1/11- witness generalized seizure in AM <  Vital Signs:   Filed Vitals:   04/04/11 1152 04/04/11 1200 04/04/11 1300 04/04/11  1400  BP:  154/88 152/77 148/90  Pulse:  99 99 101  Temp: 98.2 F (36.8 C) 98.6 F (37 C) 98.6 F (37 C) 98.6 F (37 C)  TempSrc: Oral     Resp:  26 26 27   Height:      Weight:      SpO2:  100% 99% 98%       ABG    Component Value Date/Time   PHART 7.392 04/04/2011 0457   PCO2ART 36.6 04/04/2011 0457   PO2ART 85.0 04/04/2011 0457   HCO3 22.2 04/04/2011 0457   TCO2 23 04/04/2011 0457   ACIDBASEDEF 2.0 04/04/2011 0457   O2SAT 96.0 04/04/2011 0457    Physical Examination: Gen: lots of jerking activity. Cognition appears intact HEENT:   OP clear, perrl Neck: supple without masses PULM: crackles in bases CV: RRR, no mgr, no clear JVD AB: distended, BS infrequent, soft, nontender, no hsm Ext: 1+ BUE edema Derm: no rash or skin breakdown Neuro: diffusely weak, no focal deficits, frequent myoclonus   CBC    Component Value Date/Time   WBC 21.3* 04/04/2011 0501   RBC 3.86* 04/04/2011 0501   HGB 11.4* 04/04/2011 0501   HCT 34.8* 04/04/2011 0501   PLT 290 04/04/2011 0501   MCV 90.2 04/04/2011 0501   MCH 29.5 04/04/2011 0501   MCHC 32.8 04/04/2011 0501   RDW 14.7 04/04/2011 0501   LYMPHSABS 2.1 03/28/2011 1545   MONOABS 0.7 03/28/2011 1545   EOSABS 0.3 03/28/2011 1545   BASOSABS 0.1 03/28/2011 1545    BMET  Component Value Date/Time   NA 140 04/04/2011 0501   K 4.5 04/04/2011 0501   CL 105 04/04/2011 0501   CO2 22 04/04/2011 0501   GLUCOSE 80 04/04/2011 0501   BUN 69* 04/04/2011 0501   CREATININE 2.54* 04/04/2011 0501   CALCIUM 8.6 04/04/2011 0501   GFRNONAA 26* 04/04/2011 0501   GFRAA 30* 04/04/2011 0501    Assessment and Plan:  1. VDRF - post arrest, resolved.   2. Cardiac PEA arrest likely due to pulmonary edema Suspect hypertensive cardiomyopathy Cont to monitor Cont antihypertensives - meds adjusted   3. Renal:  AKI, cause unknown. precipitous rise in creatinine but now improving.  Unclear if steroids mediated rise in Cr or not.    - Cont diurese  -creatinine  improved today  - renal following  4. ID - off all abx  - monitor  5. Neuro - seizures, myoclonus - neuro following. Anticonvulsants adjusted  6. Heme Anemia (03/26/2011) No indication for PRBCs presently  7. Diet:  Given seizures, make npo until neurology sees her, but order speech eval for later today  Mother updated @ bedside  30 min CCM time  Bethanne Ginger, MD;  PCCM service; Mobile [phone]

## 2011-04-04 NOTE — Progress Notes (Signed)
Subjective: Sleeping; sedated (Compazine and Ativan earlier). No recurrent seizures. Myoclonic-like movements seen this a.m.  Objective: Current vital signs: BP 127/68  Pulse 95  Temp(Src) 98.4 F (36.9 C) (Oral)  Resp 32  Ht 5\' 2"  (1.575 m)  Wt 67.3 kg (148 lb 5.9 oz)  BMI 27.14 kg/m2  SpO2 99%  LMP 03/02/2011  Neurologic Exam: Regular breathing pattern; Pupils and EOM's normal; no spontaneous movements; flaccid muscle tone.  Lab Results: Results for orders placed during the hospital encounter of 03/26/11 (from the past 48 hour(s))  URINE CULTURE     Status: Normal   Collection Time   04/02/11 11:14 AM      Component Value Range Comment   Specimen Description URINE, CATHETERIZED      Special Requests NONE      Setup Time [id number]      Colony Count NO GROWTH      Culture NO GROWTH      Report Status 04/03/2011 FINAL     CULTURE, BLOOD (ROUTINE X 2)     Status: Normal (Preliminary result)   Collection Time   04/02/11 12:00 PM      Component Value Range Comment   Specimen Description BLOOD RIGHT ANTECUBITAL      Special Requests BOTTLES DRAWN AEROBIC AND ANAEROBIC 10CC      Setup Time [id number]      Culture        Value:        BLOOD CULTURE RECEIVED NO GROWTH TO DATE CULTURE WILL BE HELD FOR 5 DAYS BEFORE ISSUING A FINAL NEGATIVE REPORT   Report Status PENDING     GLUCOSE, CAPILLARY     Status: Abnormal   Collection Time   04/02/11 12:09 PM      Component Value Range Comment   Glucose-Capillary 100 (*) 70 - 99 (mg/dL)   CULTURE, BLOOD (ROUTINE X 2)     Status: Normal (Preliminary result)   Collection Time   04/02/11 12:15 PM      Component Value Range Comment   Specimen Description BLOOD RIGHT HAND      Special Requests BOTTLES DRAWN AEROBIC ONLY 10CC      Setup Time [id number]      Culture        Value:        BLOOD CULTURE RECEIVED NO GROWTH TO DATE CULTURE WILL BE HELD FOR 5 DAYS BEFORE ISSUING A FINAL NEGATIVE REPORT   Report Status PENDING     GLUCOSE,  CAPILLARY     Status: Abnormal   Collection Time   04/02/11  4:31 PM      Component Value Range Comment   Glucose-Capillary 105 (*) 70 - 99 (mg/dL)   GLUCOSE, CAPILLARY     Status: Abnormal   Collection Time   04/02/11  7:15 PM      Component Value Range Comment   Glucose-Capillary 119 (*) 70 - 99 (mg/dL)   GLUCOSE, CAPILLARY     Status: Abnormal   Collection Time   04/03/11 12:15 AM      Component Value Range Comment   Glucose-Capillary 102 (*) 70 - 99 (mg/dL)   POCT I-STAT 3, BLOOD GAS (G3+)     Status: Abnormal   Collection Time   04/03/11  3:35 AM      Component Value Range Comment   pH, Arterial 7.352  7.350 - 7.400     pCO2 arterial 40.4  35.0 - 45.0 (mmHg)    pO2, Arterial 129.0 (*) 80.0 -  100.0 (mmHg)    Bicarbonate 22.4  20.0 - 24.0 (mEq/L)    TCO2 24  0 - 100 (mmol/L)    O2 Saturation 99.0      Acid-base deficit 3.0 (*) 0.0 - 2.0 (mmol/L)    Patient temperature 37.1 C      Collection site RADIAL, ALLEN'S TEST ACCEPTABLE      Drawn by Operator      Sample type ARTERIAL     GLUCOSE, CAPILLARY     Status: Abnormal   Collection Time   04/03/11  3:50 AM      Component Value Range Comment   Glucose-Capillary 108 (*) 70 - 99 (mg/dL)   RENAL FUNCTION PANEL     Status: Abnormal   Collection Time   04/03/11  4:50 AM      Component Value Range Comment   Sodium 144  135 - 145 (mEq/L)    Potassium 4.0  3.5 - 5.1 (mEq/L)    Chloride 112  96 - 112 (mEq/L)    CO2 22  19 - 32 (mEq/L)    Glucose, Bld 97  70 - 99 (mg/dL)    BUN 57 (*) 6 - 23 (mg/dL)    Creatinine, Ser 4.09 (*) 0.50 - 1.10 (mg/dL)    Calcium 8.1 (*) 8.4 - 10.5 (mg/dL)    Phosphorus 5.4 (*) 2.3 - 4.6 (mg/dL)    Albumin 1.7 (*) 3.5 - 5.2 (g/dL)    GFR calc non Af Amer 23 (*) >90 (mL/min)    GFR calc Af Amer 27 (*) >90 (mL/min)   CBC     Status: Abnormal   Collection Time   04/03/11  4:50 AM      Component Value Range Comment   WBC 15.1 (*) 4.0 - 10.5 (K/uL)    RBC 3.28 (*) 3.87 - 5.11 (MIL/uL)    Hemoglobin 9.7  (*) 12.0 - 15.0 (g/dL)    HCT 81.1 (*) 91.4 - 46.0 (%)    MCV 89.9  78.0 - 100.0 (fL)    MCH 29.6  26.0 - 34.0 (pg)    MCHC 32.9  30.0 - 36.0 (g/dL)    RDW 78.2  95.6 - 21.3 (%)    Platelets 234  150 - 400 (K/uL)   GLUCOSE, CAPILLARY     Status: Normal   Collection Time   04/03/11  8:09 AM      Component Value Range Comment   Glucose-Capillary 98  70 - 99 (mg/dL)    Comment 1 Notify RN      Comment 2 Documented in Chart     GLUCOSE, CAPILLARY     Status: Normal   Collection Time   04/03/11  2:35 PM      Component Value Range Comment   Glucose-Capillary 97  70 - 99 (mg/dL)    Comment 1 Notify RN      Comment 2 Documented in Chart     GLUCOSE, CAPILLARY     Status: Normal   Collection Time   04/03/11  3:28 PM      Component Value Range Comment   Glucose-Capillary 97  70 - 99 (mg/dL)   POCT I-STAT 3, BLOOD GAS (G3+)     Status: Abnormal   Collection Time   04/03/11  7:34 PM      Component Value Range Comment   pH, Arterial 7.420 (*) 7.350 - 7.400     pCO2 arterial 37.5  35.0 - 45.0 (mmHg)    pO2, Arterial 137.0 (*)  80.0 - 100.0 (mmHg)    Bicarbonate 24.2 (*) 20.0 - 24.0 (mEq/L)    TCO2 25  0 - 100 (mmol/L)    O2 Saturation 99.0      Patient temperature 37.3 C      Collection site RADIAL, ALLEN'S TEST ACCEPTABLE      Drawn by Operator      Sample type ARTERIAL     POCT I-STAT 3, BLOOD GAS (G3+)     Status: Normal   Collection Time   04/04/11  4:57 AM      Component Value Range Comment   pH, Arterial 7.392  7.350 - 7.400     pCO2 arterial 36.6  35.0 - 45.0 (mmHg)    pO2, Arterial 85.0  80.0 - 100.0 (mmHg)    Bicarbonate 22.2  20.0 - 24.0 (mEq/L)    TCO2 23  0 - 100 (mmol/L)    O2 Saturation 96.0      Acid-base deficit 2.0  0.0 - 2.0 (mmol/L)    Patient temperature 37.3 C      Collection site RADIAL, ALLEN'S TEST ACCEPTABLE      Drawn by Operator      Sample type ARTERIAL     RENAL FUNCTION PANEL     Status: Abnormal   Collection Time   04/04/11  5:01 AM      Component  Value Range Comment   Sodium 140  135 - 145 (mEq/L)    Potassium 4.5  3.5 - 5.1 (mEq/L)    Chloride 105  96 - 112 (mEq/L)    CO2 22  19 - 32 (mEq/L)    Glucose, Bld 80  70 - 99 (mg/dL)    BUN 69 (*) 6 - 23 (mg/dL)    Creatinine, Ser 1.61 (*) 0.50 - 1.10 (mg/dL)    Calcium 8.6  8.4 - 10.5 (mg/dL)    Phosphorus 5.8 (*) 2.3 - 4.6 (mg/dL)    Albumin 1.7 (*) 3.5 - 5.2 (g/dL)    GFR calc non Af Amer 26 (*) >90 (mL/min)    GFR calc Af Amer 30 (*) >90 (mL/min)   CBC     Status: Abnormal   Collection Time   04/04/11  5:01 AM      Component Value Range Comment   WBC 21.3 (*) 4.0 - 10.5 (K/uL)    RBC 3.86 (*) 3.87 - 5.11 (MIL/uL)    Hemoglobin 11.4 (*) 12.0 - 15.0 (g/dL)    HCT 09.6 (*) 04.5 - 46.0 (%)    MCV 90.2  78.0 - 100.0 (fL)    MCH 29.5  26.0 - 34.0 (pg)    MCHC 32.8  30.0 - 36.0 (g/dL)    RDW 40.9  81.1 - 91.4 (%)    Platelets 290  150 - 400 (K/uL)   VALPROIC ACID LEVEL     Status: Abnormal   Collection Time   04/04/11  5:01 AM      Component Value Range Comment   Valproic Acid Lvl 13.4 (*) 50.0 - 100.0 (ug/mL)        Medications:  Scheduled:   . antiseptic oral rinse  15 mL Mouth Rinse BID  . chlorproMAZINE (THORAZINE) IV  25 mg Intravenous Once  . clonazePAM  0.5 mg Oral Q6H  . furosemide  160 mg Intravenous BID  . heparin  5,000 Units Subcutaneous Q8H  . hydrALAZINE  10 mg Intravenous Q8H  . insulin aspart  0-15 Units Subcutaneous Q4H  . metoprolol  5 mg  Intravenous Q6H  . nitroGLYCERIN  1 inch Topical Q6H  . sodium chloride  10 mL Intracatheter Q12H  . sodium chloride  3 mL Intravenous Q12H  . valproate sodium  1,000 mg Intravenous Once  . valproate sodium  500 mg Intravenous Q12H  . DISCONTD: antiseptic oral rinse  15 mL Mouth Rinse QID  . DISCONTD: chlorhexidine  15 mL Mouth Rinse BID  . DISCONTD: clonazePAM  0.5 mg Oral QID  . DISCONTD: famotidine  20 mg Per Tube QHS  . DISCONTD: labetalol  400 mg Per Tube BID  . DISCONTD: phenytoin (DILANTIN) IV  100 mg  Intravenous QHS  . 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 Low Depakote level. Bilateral LE weakness, improving; unclear etiology.  Plan: 1. Depacon 500 mg bolus 2. Increase Depacon maintenance to 750 mg Q12H 3. Change Klonopin 0.5 mg to Q6H 4. Depakote level in A.M.  C.R. Roseanne Reno, MD   04/04/2011  8:01 AM

## 2011-04-04 NOTE — Progress Notes (Signed)
Speech Language/Pathology Clinical/Bedside Swallow Evaluation Patient Details  Name: Heather Sims MRN: [id number] DOB: March 18, 1991 Today's Date: 04/04/2011  Past Medical History:  Past Medical History  Diagnosis Date  . Pneumonia last 2 weeks    'walking pneumonia'   Past Surgical History: No past surgical history on file. HPI:  21 y/o female with no prior history of dysphagia referred for BSE following recent extubation. BSE attempted x1 on 04-03-11 but not completed secondary to lethargy  and continued seizure activity. Patient with reported URI for two weeks pior to admission to [facility] ED.   Assessment/Recommendations/Treatment Plan    SLP Assessment Clinical Impression Statement: Oropharyngeal dysphagia present marked by delay in initiation paired with decreased laryngeal elevation mainly due to weakness. Evaluation limited secondary to lethargy.  No outward s/s of aspiration observed s/p PO trials of puree and thin water by cup/straw  but due to overall weakness and mentation recommend to proceed with conservative diet  of  puree ( small amounts  throughout day vs. Meals when patient is alert) and cup sips of water only. Education concerning POC provided to patient's mother who was present at completion of evaluation.  Per nursing patient tolerating cup sips of thin water for two days with medication w/o outward s/s of aspiration.  Recommed full supervision as aspiration risk remains high .  ST to follow on 04-05-11 for diet tolerance and possible advancement.   Risk for Aspiration: Moderate Other Related Risk Factors: Lethargy  Swallow Recommendations Recommended Consults: Other (Comment) (FEES to be determined) Solid Consistency: Dysphagia 1 (Puree) Liquid Consistency: Thin (cup sips of water only) Liquid Administration via: Cup;No straw Medication Administration: Whole meds with liquid Supervision: Full supervision/cueing for compensatory strategies Compensations: Small  sips/bites;Slow rate;Clear throat intermittently;Effortful swallow;Multiple dry swallows after each bite/sip Postural Changes and/or Swallow Maneuvers: Seated upright 90 degrees;Upright 30-60 min after meal;Out of bed for meals Oral Care Recommendations: Oral care before and after PO Follow up Recommendations: Inpatient Rehab  Treatment Plan Speech Therapy Frequency: min 2x/week Treatment Duration: 2 weeks Interventions: Aspiration precaution training;Trials of upgraded texture/liquids;Diet toleration management by SLP;Compensatory techniques;Patient/family education  Prognosis Prognosis for Safe Diet Advancement: Good  Individuals Consulted Consulted and Agree with Results and Recommendations: Patient;Family member/caregiver  Swallowing Goals  SLP Swallowing Goals Patient will consume recommended diet without observed clinical signs of aspiration with: Moderate assistance Patient will utilize recommended strategies during swallow to increase swallowing safety with: Moderate assistance   General  Date of Onset: 03/26/11 HPI: 21 y/o female with no prior history of dysphagia referred for BSE following recent extubation. BSE attempted x1 on 04-03-11 but not completed secondary to lethargy  and continued seizure activity. Patient with reported URI for two weeks pior to admission to [facility] ED. Type of Study: Bedside swallow evaluation Diet Prior to this Study: IV;NPO;Other (Comment) (medication whole with thin liquid) Temperature Spikes Noted: No Respiratory Status: Supplemental O2 delivered via (comment) (nasal cannula) History of Intubation: Yes Length of Intubations (days): 7 days Date extubated: 04/02/11 Behavior/Cognition: Cooperative;Confused;Lethargic;Requires cueing Oral Cavity - Dentition: Adequate natural dentition Patient Positioning: Upright in bed Baseline Vocal Quality: Breathy;Hoarse;Low vocal intensity Volitional Cough: Strong Volitional Swallow: Able to elicit Ice chips:  Tested (comment)  Oral Motor/Sensory Function  Labial ROM: Within Functional Limits Labial Symmetry: Within Functional Limits Labial Strength: Within Functional Limits Labial Sensation: Reduced Lingual ROM: Reduced right;Reduced left Lingual Symmetry: Within Functional Limits Lingual Strength: Reduced Lingual Sensation: Reduced Facial ROM: Within Functional Limits Facial Symmetry: Within Functional Limits Facial Strength: Within Functional  Limits Facial Sensation: Within Functional Limits Velum: Within Functional Limits Mandible: Within Functional Limits  Consistency Results  Ice Chips Ice chips: Impaired Pharyngeal Phase Impairments: Delayed Swallow;Decreased hyoid-laryngeal movement Other Comments: Verbal cues to complete swallow  Thin Liquid Thin Liquid: Impaired Pharyngeal  Phase Impairments: Delayed Swallow;Decreased hyoid-laryngeal movement  Nectar Thick Liquid Nectar Thick Liquid: Not tested  Honey Thick Liquid Honey Thick Liquid: Not tested  Puree Puree: Impaired Pharyngeal Phase Impairments: Delayed Swallow;Decreased hyoid-laryngeal movement  Solid Solid: Not tested Moreen Fowler, M.S., CCC-SLP [phone]  [facility] 04/04/2011,2:52 PM

## 2011-04-04 NOTE — Progress Notes (Signed)
S: Has had Ativan, thorazine, compazine last several hours Very sleepy Has continued to have some myoclonus O: BP 140/85  Pulse 97  Temp(Src) 98.4 F (36.9 C) (Core (Comment))  Resp 28  Ht 5\' 2"  (1.575 m)  Wt 67.3 kg (148 lb 5.9 oz)  BMI 27.14 kg/m2  SpO2 99%  LMP 03/02/2011 I/O last 3 completed shifts: In: 1221 [P.O.:290; I.V.:470; IV Piggyback:461] Out: 3190 [Urine:3190] Total I/O In: 80.5 [I.V.:14.5; IV Piggyback:66] Out: 125 [Urine:125]  Medications: Infusions:    . sodium chloride 15 mL/hr at 03/31/11 1800  . sodium chloride 10 mL/hr at 04/04/11 0927  . feeding supplement (NEPRO CARB STEADY) 1,000 mL (03/31/11 2208)   Scheduled Medications:    . antiseptic oral rinse  15 mL Mouth Rinse BID  . chlorproMAZINE (THORAZINE) IV  25 mg Intravenous Once  . clonazePAM  0.5 mg Oral Q6H  . furosemide  160 mg Intravenous BID  . heparin  5,000 Units Subcutaneous Q8H  . hydrALAZINE  10 mg Intravenous Q8H  . insulin aspart  0-15 Units Subcutaneous Q4H  . metoprolol  5 mg Intravenous Q6H  . nitroGLYCERIN  1 inch Topical Q6H  . sodium chloride  10 mL Intracatheter Q12H  . sodium chloride  3 mL Intravenous Q12H  . valproate sodium  1,000 mg Intravenous Once  . valproate sodium  500 mg Intravenous Once  . valproate sodium  750 mg Intravenous Q12H  . DISCONTD: antiseptic oral rinse  15 mL Mouth Rinse QID  . DISCONTD: chlorhexidine  15 mL Mouth Rinse BID  . DISCONTD: clonazePAM  0.5 mg Oral QID  . DISCONTD: phenytoin (DILANTIN) IV  100 mg Intravenous QHS  . DISCONTD: sodium bicarbonate  1,300 mg Oral TID  . DISCONTD: valproate sodium  500 mg Intravenous Q12H  PRN Meds:.hydrALAZINE, labetalol, LORazepam, sodium chloride, sodium chloride  BP 140/85  Pulse 97  Temp(Src) 98.4 F (36.9 C) (Core (Comment))  Resp 28  Ht 5\' 2"  (1.575 m)  Wt 67.3 kg (148 lb 5.9 oz)  BMI 27.14 kg/m2  SpO2 99%  LMP 03/02/2011   Intake/Output Summary (Last 24 hours) at 04/04/11 0937 Last data  filed at 04/04/11 0927  Gross per 24 hour  Intake  942.5 ml  Output   1700 ml  Net -757.5 ml    Weight change: -2.1 kg (-4 lb 10.1 oz)  EXAM: Gen: extubated, asleep, restless but hot hiccuping CVS:RRR, no m/r/g  Resp:clear breat sounds throughout.  Abd: normoactive bs, soft  Ext: trace to 1+ edema; warm and 2+ pulses, warm well perfused; both upper and lower extremity edema Neuro: Very sleepy from meds; could not adequately assess  Labs: Basic Metabolic Panel:  Lab 04/04/11 1610 04/03/11 0450 04/02/11 0515 03/30/11 0415 03/29/11 0645 03/28/11 1730  NA 140 144 147* -- -- --  K 4.5 4.0 4.1 -- -- --  CL 105 112 114* -- -- --  CO2 22 22 21  -- -- --  GLUCOSE 80 97 94 -- -- --  BUN 69* 57* 58* -- -- --  CREATININE 2.54* 2.80* 3.43* -- -- --  CALCIUM 8.6 8.1* 8.0* -- -- --  MG -- -- -- 2.2 1.9 1.6  PHOS 5.8* 5.4* 7.3* -- -- --    Liver Function Tests:  Lab 04/04/11 0501 04/03/11 0450 04/02/11 0515 03/28/11 1648  AST -- -- -- 40*  ALT -- -- -- 16  ALKPHOS -- -- -- 50  BILITOT -- -- -- 0.1*  PROT -- -- -- 3.8*  ALBUMIN 1.7* 1.7* 1.7* --   No results found for this basename: LIPASE:3,AMYLASE:3 in the last 168 hours No results found for this basename: AMMONIA:3 in the last 168 hours  CBC:  Lab 04/04/11 0501 04/03/11 0450 04/02/11 0515 04/01/11 0540 03/31/11 1116 03/28/11 1545  WBC 21.3* 15.1* 13.4* -- -- --  NEUTROABS -- -- -- -- -- 7.0  HGB 11.4* 9.7* 10.1* -- -- --  HCT 34.8* 29.5* 30.9* -- -- --  MCV 90.2 89.9 90.6 90.6 89.1 --  PLT 290 234 264 -- -- --   CBG:  Lab 04/04/11 0812 04/04/11 0356 04/03/11 2345 04/03/11 2124 04/03/11 1528  GLUCAP 89 88 80 85 97   ABG    Component Value Date/Time   PHART 7.392 04/04/2011 0457   PCO2ART 36.6 04/04/2011 0457   PO2ART 85.0 04/04/2011 0457   HCO3 22.2 04/04/2011 0457   TCO2 23 04/04/2011 0457   ACIDBASEDEF 2.0 04/04/2011 0457   O2SAT 96.0 04/04/2011 0457   Renal and Serologic Work up: (negative to date)  HIV Neg  ANA  Neg, DSDNA ab 22 (WNL) ASO 87 (WNL)  Diract Antiglobulin complement and IgG Neg  Total complement high, C3, C4 Normal  PTH 250.2 (H)  Kappa Light Chains 8.6 (H) Lamda Light Chains 5.8(H), Ratio 1.46 (WNL)  ANCA Negative  SPEP:  Total Protien 3.5 (L)  Albumin 45.1 (L)  Alpha 1 globulin 10.8 (H)  Alpha 2 globulin 19.2 (H)  Beta Globulin 5.8  Beta 2 5.1  Gamma Globulin 14.0  M Spike not detected  UPEP:  Tpro 33.4  Albumin Detected  Alpha 1 Detected  Alpha 2 Detected  Beta Detected  Gamma globulin Detected  Pending:  Glomerular Basement membrane  Cryoglobulin Dg Chest Port 1 View  04/04/2011  *RADIOLOGY REPORT*  Clinical Data: Pulmonary edema  PORTABLE CHEST - 1 VIEW  Comparison: Yesterday  Findings: Moderate cardiomegaly.  Left PICC stable.  Low volumes. No pneumothorax.  Left basilar consolidation worse.  IMPRESSION: Worsening left basilar consolidation.  Original Report Authenticated By: Donavan Burnet, M.D.   Dg Chest Port 1 View  04/03/2011  *RADIOLOGY REPORT*  Clinical Data: Dyspnea.  PORTABLE CHEST - 1 VIEW  Comparison: 04/03/2011  Findings: Interval removal of right IJ central line.  Moderate cardiomegaly.  Possible small left pleural effusion. No pneumothorax.  Improved interstitial edema, with mild venous congestion remaining.  Improved right base aeration.  Persistent left base air space disease.  IMPRESSION: 1.  Cardiomegaly with low lung volumes and improved interstitial edema/pulmonary venous congestion. 2.  Probable small left pleural effusion with persistent left base atelectasis versus infection.  Original Report Authenticated By: Consuello Bossier, M.D.   Dg Chest Port 1 View  04/03/2011  *RADIOLOGY REPORT*  Clinical Data: Respiratory failure.  PORTABLE CHEST - 1 VIEW  Comparison: 04/02/2011  Findings: Interval extubation and removal of NG tube.  Right central line is unchanged.  Stable cardiomegaly.  Increasing bibasilar opacities, likely atelectasis.  Possible small  bilateral effusions.  Mild vascular congestion and interstitial prominence throughout the lungs, likely mild edema.  IMPRESSION: Interval extubation.  Continued mild edema.  Increasing bibasilar atelectasis following extubation.  Original Report Authenticated By: Cyndie Chime, M.D.   Assessment/Plan:  21 year old female with cardiac arrest due to respiratory arrest, who has significant cardiomyopathy and HTN, with presumed acute renal failure, now extubated, and recovering renal function:   1) Renal Failure- Renal function continues to slowly improve; good UOP with decrease in Lasix to  160 BID yesterday  Will continue current dose for now, reassess in AM.  Hopeful for continued functional recovery. Patient's renal failure and heart failure/plm edema and respiratory arrest may have all been due to malignant HTN. As blood pressure is controlled, proteinuria may resolve. There was concern for systemic disease, autoimmune top of the list, but nothing in serologic workup so far to point to a specific etiology. Steroid trial now d/c'd. No indication for RRT. Can consider renal biopsy when stable if persistent proteinuria, evidence of nephrotic proteinuria or active sediment, or failure to return to normal renal function in setting of controlled HTN.  2) Pulm - ? PNA vs non cardiogenic pulmonary edema- CXR with some worsening left base atelectasis; pulm edema has resolved; WBC is increasing; ? Aspiration - per primary service 3) Neuro-  Now on Klonopin and Depakote for seizures/myoclonus; neuro following  4) Heme- hemoglobinstable since transfusion on 03/29/11. Ordered Coomb's and hemolysis workup. No thrombocytopenia.  5) CV- IV lasix, prn hydralazine, prn metoprolol  Aleka Twitty B

## 2011-04-04 NOTE — Progress Notes (Signed)
eLink Physician-Brief Progress Note Patient Name: Heather Sims DOB: 1990-06-30 MRN: [id number]  Date of Service  04/04/2011   HPI/Events of Note  Request from nurse for medication for hiccups.  Patient treated with thorazine 25 mg IV yesterday times two for recurrent hiccups.  Later in day seen by neurology concerned about ongoing seizure activity and changed from dilantin to Depakote.  Unclear whether the movements I observed were considered ongoing seizures but the thorazine helped.  Patient has the same motor features this AM and is alert and interactive.   eICU Interventions  A: looks like hiccups  P: One time dose of thorazine 25 mg IV   Intervention Category Intermediate Interventions: Other:  Deretha Ertle 04/04/2011, 4:09 AM

## 2011-04-05 ENCOUNTER — Inpatient Hospital Stay (HOSPITAL_COMMUNITY): Payer: Medicaid Other

## 2011-04-05 DIAGNOSIS — N179 Acute kidney failure, unspecified: Secondary | ICD-10-CM

## 2011-04-05 DIAGNOSIS — I10 Essential (primary) hypertension: Secondary | ICD-10-CM

## 2011-04-05 DIAGNOSIS — G40309 Generalized idiopathic epilepsy and epileptic syndromes, not intractable, without status epilepticus: Secondary | ICD-10-CM

## 2011-04-05 DIAGNOSIS — I501 Left ventricular failure: Secondary | ICD-10-CM

## 2011-04-05 LAB — RENAL FUNCTION PANEL
CO2: 26 mEq/L (ref 19–32)
GFR calc Af Amer: 30 mL/min — ABNORMAL LOW (ref >90)
Glucose, Bld: 110 mg/dL — ABNORMAL HIGH (ref 70–99)
Potassium: 4.3 mEq/L (ref 3.5–5.1)
Sodium: 143 mEq/L (ref 135–145)

## 2011-04-05 LAB — CBC
Hemoglobin: 12.2 g/dL (ref 12.0–15.0)
RBC: 4.07 MIL/uL (ref 3.87–5.11)

## 2011-04-05 LAB — VALPROIC ACID LEVEL: Valproic Acid Lvl: 25.1 ug/mL — ABNORMAL LOW (ref 50.0–100.0)

## 2011-04-05 MED ORDER — HYDRALAZINE HCL 20 MG/ML IJ SOLN
20.0000 mg | Freq: Four times a day (QID) | INTRAMUSCULAR | Status: DC
  Administered 2011-04-05 – 2011-04-06 (×4): 20 mg via INTRAVENOUS
  Filled 2011-04-05 (×5): qty 1

## 2011-04-05 MED ORDER — PROMETHAZINE HCL 25 MG/ML IJ SOLN
12.5000 mg | Freq: Four times a day (QID) | INTRAMUSCULAR | Status: DC | PRN
  Administered 2011-04-05: 23:00:00 via INTRAVENOUS
  Administered 2011-04-07: 12.5 mg via INTRAVENOUS
  Filled 2011-04-05 (×3): qty 1

## 2011-04-05 MED ORDER — ONDANSETRON HCL 4 MG/2ML IJ SOLN
4.0000 mg | Freq: Three times a day (TID) | INTRAMUSCULAR | Status: DC | PRN
  Administered 2011-04-05 – 2011-04-08 (×2): 4 mg via INTRAVENOUS
  Filled 2011-04-05 (×3): qty 2

## 2011-04-05 MED ORDER — PANTOPRAZOLE SODIUM 40 MG IV SOLR
40.0000 mg | Freq: Two times a day (BID) | INTRAVENOUS | Status: DC
  Administered 2011-04-05 – 2011-04-06 (×3): 40 mg via INTRAVENOUS
  Filled 2011-04-05 (×4): qty 40

## 2011-04-05 MED ORDER — CLONIDINE HCL 0.1 MG/24HR TD PTWK
0.1000 mg | MEDICATED_PATCH | TRANSDERMAL | Status: DC
  Administered 2011-04-05: 0.1 mg via TRANSDERMAL
  Filled 2011-04-05: qty 1

## 2011-04-05 MED ORDER — PROMETHAZINE HCL 25 MG/ML IJ SOLN
25.0000 mg | Freq: Once | INTRAMUSCULAR | Status: AC
  Administered 2011-04-05: 25 mg via INTRAVENOUS
  Filled 2011-04-05: qty 1

## 2011-04-05 MED ORDER — VALPROATE SODIUM 500 MG/5ML IV SOLN
1000.0000 mg | Freq: Two times a day (BID) | INTRAVENOUS | Status: DC
  Administered 2011-04-05 – 2011-04-13 (×17): 1000 mg via INTRAVENOUS
  Filled 2011-04-05 (×19): qty 10

## 2011-04-05 MED ORDER — WHITE PETROLATUM GEL
Status: AC
  Administered 2011-04-05: 0.2
  Filled 2011-04-05: qty 5

## 2011-04-05 MED ORDER — FUROSEMIDE 80 MG PO TABS
120.0000 mg | ORAL_TABLET | Freq: Two times a day (BID) | ORAL | Status: DC
  Administered 2011-04-05 – 2011-04-12 (×13): 120 mg via ORAL
  Filled 2011-04-05 (×18): qty 1

## 2011-04-05 NOTE — Progress Notes (Signed)
Name: Heather Sims MRN: [id number] DOB: 19-Sep-1990  LOS: 10  CRITICAL CARE PROGRESS NOTE  PT PROFILE: 21 y/o W with no PMH presented via EMS to the [facility] ED 1/03 after cardiorespiratory arrest - sudden onset of SOB followed by PEA arrest and 5 minutes of pulselessness. Had recently been diagnosed with hypertension in the weeks prior to admission.  Underwent hypothermia protocol. Suffered seizures upon rewarming  Lines / Drains: 03/26/11 ETT >> 1/10 03/26/11 R IJ CVL >> 1/11 03/26/11 R fem CVL >>1/4 03/26/11 L radial a-line >>1/8 04/03/11 LUE PICC >>   Cultures / Sepsis markers: 03/26/11 blood cx x2 >>neg 03/26/11 sputum cx >>neg 03/26/11 flu >> NEG 03/26/11 strep/leg ur ag >>neg  03/26/11 - Urine tox >>NEG  03/27/11 HIV test >>NR 1/10 blood >> ngtd 1/13 >> 1/10 urine >> NEG  Antibiotics: 03/26/11 vanc >>1/5 03/26/11 ceftriaxone >> 04/02/11 03/26/11 moxi >>1/5 03/26/11 tamiflu >>03/27/11  Tests / Events: 03/26/11 CT Head >>No evidence of acute intracranial hemorrhage, mass lesion, or acute Infarct. 1/4 2 D echo >The cavity size was normal. Wall thickness was increased in a pattern of moderate LVH. Systolic function was moderately reduced. The estimated ejection fraction was in the range of 35% to 40%. Wall motion was normal; 1/4 TEE >>diffuse hypokinesis, EF 40-45% 1/4 venous doppler bilaterally -NEG for VT  1/5 CT Head >>Interval left sphenoid sinusitis. Otherwise, unremarkable examination. 1/5 Renal consult for worsening renal failure -Dr. Arta Silence 1/5 Neuro consult >new onset Seizures   1/5 EEG >> minimal seizure activity 1/5 MPO Ab neg, PR-3 Ab neg, C-ANCA neg, P-ANCA neg, ANA negative 1/6 weaned off pressors , tranx 3 u PRBC 1/7-  Steroid bolus for possible autoimmune cause of renal failure, neuro consulted for seizures 1/9- MRI Brain and C/T/L spine: normal neuro structures 1/11- witness generalized seizure in AM <  Vital Signs:   Filed Vitals:   04/05/11 0900 04/05/11 1000 04/05/11 1100  04/05/11 1200  BP: 178/113 155/107 168/107 156/99  Pulse: 96 90 92 88  Temp: 99.5 F (37.5 C) 99.3 F (37.4 C) 99.1 F (37.3 C) 99.1 F (37.3 C)  TempSrc:      Resp: 24 26 28 20   Height:      Weight:      SpO2: 96% 98% 95% 96%     Physical Examination: Gen: decreased myoclonus. Cognition appears intact HEENT:   OP clear, perrl Neck: supple without masses PULM: crackles in bases CV: RRR, no mgr, no clear JVD AB: distended, BS infrequent, soft, nontender, no hsm Ext: 1+ BUE edema Derm: no rash or skin breakdown Neuro: diffusely weak, no focal deficits, frequent myoclonus   CBC    Component Value Date/Time   WBC 22.1* 04/05/2011 0503   RBC 4.07 04/05/2011 0503   HGB 12.2 04/05/2011 0503   HCT 36.4 04/05/2011 0503   PLT 298 04/05/2011 0503   MCV 89.4 04/05/2011 0503   MCH 30.0 04/05/2011 0503   MCHC 33.5 04/05/2011 0503   RDW 14.4 04/05/2011 0503   LYMPHSABS 2.1 03/28/2011 1545   MONOABS 0.7 03/28/2011 1545   EOSABS 0.3 03/28/2011 1545   BASOSABS 0.1 03/28/2011 1545    BMET    Component Value Date/Time   NA 143 04/05/2011 0503   K 4.3 04/05/2011 0503   CL 104 04/05/2011 0503   CO2 26 04/05/2011 0503   GLUCOSE 110* 04/05/2011 0503   BUN 82* 04/05/2011 0503   CREATININE 2.56* 04/05/2011 0503   CALCIUM 8.8 04/05/2011 0503  GFRNONAA 26* 04/05/2011 0503   GFRAA 30* 04/05/2011 0503    Assessment and Plan:  1. VDRF - post arrest, resolved.  2. Cardiac PEA arrest likely due to pulmonary edema Suspect hypertensive cardiomyopathy Cont to monitor Cont antihypertensives - meds adjusted  3. Renal:  AKI, cause unknown. precipitous rise in creatinine but now improving.  Unclear if steroids mediated rise in Cr or not.   - Cont diurese  -creatinine improved today  - renal following  4. ID - off all abx  - monitor  5. Neuro - seizures, myoclonus - neuro following. Anticonvulsants adjusted  6. Heme Anemia (03/26/2011) No indication for PRBCs presently  7. Diet:  Dysphagia diet  started 1/13  Father updated @ bedside  30 min CCM time. Transfer to SDU  Bethanne Ginger, MD;  PCCM service; Mobile [phone]

## 2011-04-05 NOTE — Progress Notes (Signed)
eLink Physician-Brief Progress Note Patient Name: Heather Sims DOB: [birth year] MRN: [id number]  Date of Service  04/05/2011   HPI/Events of Note  Persistent nausea Elevated BP  eICU Interventions  1. One time order for phenergan 25 mg IV 2. Use prn meds for hypertension   Intervention Category Minor Interventions: Routine modifications to care plan (e.g. PRN medications for pain, fever)  DETERDING,ELIZABETH 04/05/2011, 2:15 AM

## 2011-04-05 NOTE — Progress Notes (Signed)
eLink Physician-Brief Progress Note Patient Name: Heather Sims DOB: 09-Jun-1990 MRN: [id number]  Date of Service  04/05/2011   HPI/Events of Note  Episodes of vomiting  eICU Interventions  Zofran 4 mg IV q8 hours prn N/V   Intervention Category Minor Interventions: Routine modifications to care plan (e.g. PRN medications for pain, fever)  Danial Sisley 04/05/2011, 12:46 AM

## 2011-04-05 NOTE — Progress Notes (Signed)
E-Link Dr aware of VS and all meds given this shift .last dose    labetolol 20 mg IV  given @ 02:40 am and last dose of hydralazine 40 mg IV given @ 02:10 am  dose .Current b/p = 162/101 .map = 115 .HR = 93 .Order received from Dr Deterding for another dose labetolol 20 mg IV now . Pt sleeping .resps even and unlabored.

## 2011-04-05 NOTE — Progress Notes (Signed)
Subjective: Interval History: Up out of bed into the chair with family visiting Myoclonus is better and no more seizures are reported. She had some nausea and vomiting during the night She is able to tell me today that she know of hypertension about 6 months PTA but did not go back for followup, and the swelliing in her legs was of about the same duration, but she "controlled" that by "working out more"  Foley has been removed and she cannot tell when she has to void - just had liquid stool +/- urine  Objective:  Vital signs in last 24 hours:  Temp:  [97.7 F (36.5 C)-100.2 F (37.9 C)] 99.1 F (37.3 C) (01/13 1100) Pulse Rate:  [90-106] 92  (01/13 1100) Resp:  [17-29] 28  (01/13 1100) BP: (148-186)/(89-115) 168/107 mmHg (01/13 1100) SpO2:  [94 %-100 %] 95 % (01/13 1100) Weight:  [67.3 kg (148 lb 5.9 oz)] 67.3 kg (148 lb 5.9 oz) (01/13 0630) Weight change: 0 kg (0 lb)  Intake/Output: I/O last 3 completed shifts: In: 1401.5 [P.O.:390; I.V.:360; Other:360; IV Piggyback:291.5] Out: 2395 [Urine:1945; Emesis/NG output:450]  EXAM: Slow to speak bu very appropriate, oriented to person and place, doesn't know what has happened to her; answers questions appropriately CVS-S1S2 RS- Grossly clear lungs ABD-Protuberant, NT EXT- 1+ edema Liquid stool (getting cleaned up)  Lab Results:  Basename 04/05/11 0503 04/04/11 0501 04/03/11 0450  WBC 22.1* 21.3* 15.1*  HGB 12.2 11.4* 9.7*  HCT 36.4 34.8* 29.5*  PLT 298 290 234   BMET  Basename 04/05/11 0503 04/04/11 0501 04/03/11 0450  NA 143 140 144  K 4.3 4.5 4.0  CL 104 105 112  CO2 26 22 22   GLUCOSE 110* 80 97  BUN 82* 69* 57*  CREATININE 2.56* 2.54* 2.80*  CALCIUM 8.8 8.6 8.1*  PHOS 7.1* 5.8* 5.4*   LFT  Basename 04/05/11 0503  PROT --  ALBUMIN 1.8*  AST --  ALT --  ALKPHOS --  BILITOT --  BILIDIR --  IBILI --   PTH: Lab Results  Component Value Date   PTH 250.2* 03/28/2011   CALCIUM 8.8 04/05/2011   CAION 1.09*  03/26/2011   PHOS 7.1* 04/05/2011  Renal and Serologic Work up: (negative to date except for proteinuria)  HIV Neg  ANA Neg, DSDNA ab 22 (WNL) ASO 87 (WNL)  Diract Antiglobulin complement and IgG Neg  Total complement high, C3, C4 Normal  PTH 250.2 (H)  Kappa Light Chains 8.6 (H) Lamda Light Chains 5.8(H), Ratio 1.46 (WNL)  ANCA Negative  Urine ZOX:WRUEA 870 mg/gm creat 04/04/11; last UA >300 pro, 7-10 RBC (vs TNTC RBC on initial UA)  SPEP:  Total Protien 3.5 (L)  Albumin 45.1 (L)  Alpha 1 globulin 10.8 (H)  Alpha 2 globulin 19.2 (H)  Beta Globulin 5.8  Beta 2 5.1  Gamma Globulin 14.0  M Spike not detected  UPEP:  Tpro 33.4  Albumin Detected  Alpha 1 Detected  Alpha 2 Detected  Beta Detected  Gamma globulin Detected  Cryoglobulin REPORT Comments: (NOTE) Cryoglobulin, QL Analysis None Detected  Pending:  Glomerular Basement membrane  Studies/Results: Dg Abd 1 View  04/05/2011  **ADDENDUM** CREATED: 04/05/2011 10:00:38  The "rectal device" described on plain film is likely a Foley catheter, upon discussion with the patient's nurse.  **END ADDENDUM** SIGNED BY: Karn Cassis. Reche Dixon, M.D.    04/05/2011  *RADIOLOGY REPORT*  Clinical Data: Projectile vomiting.  ABDOMEN - 1 VIEW  Comparison: None.  Findings: Single supine view  of the abdomen and pelvis.  Mild to moderate gaseous distention of the stomach.  The far right abdomen is excluded.  Paucity of small bowel gas.  Distal rectal gas, with rectal device in place.  No pneumatosis or free intraperitoneal air.  IMPRESSION: Nonspecific bowel gas pattern, with mild to moderate gaseous distention of the stomach.  Partial exclusion of the far right side of the abdomen.  Original Report Authenticated By: Consuello Bossier, M.D.   Dg Chest Port 1 View  04/04/2011  *RADIOLOGY REPORT*  Clinical Data: Pulmonary edema  PORTABLE CHEST - 1 VIEW  Comparison: Yesterday  Findings: Moderate cardiomegaly.  Left PICC stable.  Low volumes. No pneumothorax.  Left  basilar consolidation worse.  IMPRESSION: Worsening left basilar consolidation.  Original Report Authenticated By: Donavan Burnet, M.D.   Dg Chest Port 1 View   Assessment/Plan: 21 year old female with cardiac arrest due to respiratory arrest, who has significant cardiomyopathy and HTN, with presumed acute renal failure, now extubated, and recovering renal function (unknown baseline creatinine; 2.31 on admission; nadir 1.95; peak 4.86) Illness preceded by edema and leg/abd swelling for a month or 2 PTA)  1) Renal Failure- Renal function essentially unchanged from yesterday, with decrease in Lasix to 160 BID and about 2 liters of urine out. Hopeful for continued functional recovery. Patient's renal failure and heart failure/plm edema and respiratory arrest may have all been due to malignant HTN, but primary renal disease has not been ruled out and remain suspicious of same. As blood pressure is controlled for a period of time, proteinuria may resolve. There was concern for systemic disease, autoimmune top of the list, but nothing in serologic workup so far to point to a specific etiology. She has proteinuria, but is not nephrotic at least by ZOX:WRUEA ratio. Still has some microhematuria   Steroid trial was d/c'd (got 3 days of solumedrol earlier in the week) . No indication for RRT. Can consider renal biopsy when stable if persistent proteinuria, evidence of nephrotic proteinuria or active sediment, or failure to return to normal renal function in setting of controlled HTN. Not stable enough at this time to pursue. Will start some po lasix for her edema. (has been getting IV)  UOP will be hard to gauge with foley out d/t urinary incontinence 2) Pulm - ? PNA vs non cardiogenic pulmonary edema- CXR with some worsening left base atelectasis; pulm edema has resolved; WBC is increasing; ? Aspiration - per primary service  3) Neuro- Now on Klonopin and Depakote for seizures/myoclonus; neuro following  4) Heme-  hemoglobinstable since transfusion on 03/29/11. Ordered Coomb's and hemolysis workup. No thrombocytopenia.  5) CV- CCM is adjusting BP meds

## 2011-04-05 NOTE — Progress Notes (Signed)
Subjective:  Apparent difficulty with phonation, but able to communicate well with head movements. No complaints. No recurrent seizure activity. Mild clonus is markedly diminished.  Objective: Current vital signs: BP 170/106  Pulse 92  Temp(Src) 99.1 F (37.3 C) (Core (Comment))  Resp 22  Ht 5\' 2"  (1.575 m)  Wt 67.3 kg (148 lb 5.9 oz)  BMI 27.14 kg/m2  SpO2 98%  LMP 03/02/2011  Neurologic Exam: Awake and alert. Follows commands well. No apparent distress. Cranial nerves were normal. Strength and coordination of upper extremities was normal. She had no voluntary movements of her lower extremities. Efforts to move lower extremities resulted in myoclonic jerks of both lower extremities as well as her trunk. Otherwise, no mild clonus was seen.  Lab Results: Valproic acid level today was 25.1.   Medications:  Scheduled:   . antiseptic oral rinse  15 mL Mouth Rinse BID  . clonazePAM  0.5 mg Oral Q6H  . furosemide  160 mg Intravenous BID  . heparin  5,000 Units Subcutaneous Q8H  . hydrALAZINE  10 mg Intravenous Q6H  . metoprolol tartrate  50 mg Oral BID  . promethazine  25 mg Intravenous Once  . sodium chloride  10 mL Intracatheter Q12H  . sodium chloride  3 mL Intravenous Q12H  . valproate sodium  1,000 mg Intravenous Q12H  . valproate sodium  500 mg Intravenous Once  . white petrolatum      . DISCONTD: hydrALAZINE  10 mg Intravenous Q8H  . DISCONTD: insulin aspart  0-15 Units Subcutaneous Q4H  . DISCONTD: metoprolol  5 mg Intravenous Q6H  . DISCONTD: metoprolol tartrate  25 mg Oral BID  . DISCONTD: nitroGLYCERIN  1 inch Topical Q6H  . DISCONTD: valproate sodium  750 mg Intravenous Q12H    Assessment/Plan: 1. Hypoxic encephalopathy, markedly improved clinically. Patient is awake and alert and follows commands well. 2. Mild clonus, improved with valproic acid and clonazepam. 3. Generalized seizures, controlled with valproic acid. The progress that level, however, is  still subtherapeutic. 4. 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  Plan: 1. We'll increase the Percocet to 1000 mg every 12 hours (from 750 mg every 12 hours). 2. No change in clonazepam 0.5 mg every 6 hours for mild clonus. 3. Physical therapy intervention to begin.  C.R. Roseanne Reno, MD [facility]  04/05/2011  8:55 AM

## 2011-04-06 DIAGNOSIS — N179 Acute kidney failure, unspecified: Secondary | ICD-10-CM

## 2011-04-06 DIAGNOSIS — I501 Left ventricular failure: Secondary | ICD-10-CM

## 2011-04-06 DIAGNOSIS — I10 Essential (primary) hypertension: Secondary | ICD-10-CM

## 2011-04-06 DIAGNOSIS — G40309 Generalized idiopathic epilepsy and epileptic syndromes, not intractable, without status epilepticus: Secondary | ICD-10-CM

## 2011-04-06 DIAGNOSIS — N19 Unspecified kidney failure: Secondary | ICD-10-CM

## 2011-04-06 LAB — URINE CULTURE
Colony Count: 80000
Culture  Setup Time: 201301141407

## 2011-04-06 LAB — URINALYSIS, ROUTINE W REFLEX MICROSCOPIC
Glucose, UA: NEGATIVE mg/dL
Protein, ur: 100 mg/dL — AB
pH: 5 (ref 5.0–8.0)

## 2011-04-06 LAB — COMPREHENSIVE METABOLIC PANEL
ALT: 14 U/L (ref 0–35)
Albumin: 1.7 g/dL — ABNORMAL LOW (ref 3.5–5.2)
Alkaline Phosphatase: 63 U/L (ref 39–117)
Potassium: 3.9 mEq/L (ref 3.5–5.1)
Sodium: 141 mEq/L (ref 135–145)
Total Protein: 5.2 g/dL — ABNORMAL LOW (ref 6.0–8.3)

## 2011-04-06 LAB — PROCALCITONIN: Procalcitonin: 1 ng/mL

## 2011-04-06 LAB — URINE MICROSCOPIC-ADD ON

## 2011-04-06 LAB — CULTURE, BLOOD (ROUTINE X 2)
Culture  Setup Time: 201301141354
Culture: NO GROWTH

## 2011-04-06 LAB — CBC
HCT: 35 % — ABNORMAL LOW (ref 36.0–46.0)
MCHC: 33.1 g/dL (ref 30.0–36.0)
MCV: 89.3 fL (ref 78.0–100.0)
RDW: 14.4 % (ref 11.5–15.5)
WBC: 18.2 10*3/uL — ABNORMAL HIGH (ref 4.0–10.5)

## 2011-04-06 LAB — MAGNESIUM: Magnesium: 2.4 mg/dL (ref 1.5–2.5)

## 2011-04-06 MED ORDER — DIPHENOXYLATE-ATROPINE 2.5-0.025 MG PO TABS
2.0000 | ORAL_TABLET | Freq: Once | ORAL | Status: AC
  Administered 2011-04-06: 2 via ORAL
  Filled 2011-04-06: qty 2

## 2011-04-06 MED ORDER — PANTOPRAZOLE SODIUM 40 MG PO TBEC
40.0000 mg | DELAYED_RELEASE_TABLET | Freq: Every day | ORAL | Status: DC
  Administered 2011-04-07 – 2011-04-08 (×2): 40 mg via ORAL
  Filled 2011-04-06 (×2): qty 1

## 2011-04-06 MED ORDER — METOPROLOL TARTRATE 100 MG PO TABS
100.0000 mg | ORAL_TABLET | Freq: Two times a day (BID) | ORAL | Status: DC
  Administered 2011-04-06 – 2011-04-13 (×14): 100 mg via ORAL
  Filled 2011-04-06 (×16): qty 1

## 2011-04-06 MED ORDER — HYDRALAZINE HCL 25 MG PO TABS
25.0000 mg | ORAL_TABLET | Freq: Four times a day (QID) | ORAL | Status: DC
  Administered 2011-04-06 – 2011-04-13 (×28): 25 mg via ORAL
  Filled 2011-04-06 (×32): qty 1

## 2011-04-06 MED ORDER — CLONIDINE HCL 0.3 MG/24HR TD PTWK: 0.3000 mg | MEDICATED_PATCH | TRANSDERMAL | Status: DC

## 2011-04-06 MED ORDER — DIPHENOXYLATE-ATROPINE 2.5-0.025 MG PO TABS
1.0000 | ORAL_TABLET | ORAL | Status: DC | PRN
  Administered 2011-04-06 – 2011-04-07 (×3): 1 via ORAL
  Filled 2011-04-06 (×3): qty 1

## 2011-04-06 NOTE — Progress Notes (Signed)
Name: Heather Sims MRN: [id number] DOB: 08-14-1990  LOS: 11  CRITICAL CARE PROGRESS NOTE  PT PROFILE: 21 y/o W with no PMH presented via EMS to the [facility] ED 1/03 after cardiorespiratory arrest - sudden onset of SOB followed by PEA arrest and 5 minutes of pulselessness. Had recently been diagnosed with hypertension in the weeks prior to admission.  Underwent hypothermia protocol. Suffered seizures upon rewarming  Overnight Events: -fever, and cloudy urine, U/A with large leukocytes and 21-50 WBC, no abx started, foley removed  Lines / Drains: 03/26/11 ETT >> 1/10 03/26/11 R IJ CVL >> 1/11 03/26/11 R fem CVL >>1/4 03/26/11 L radial a-line >>1/8 04/03/11 LUE PICC >>   Cultures / Sepsis markers: 1/14 blood cx >>> 1/14 urine cx >>> 03/26/11 blood cx x2 >>neg 03/26/11 sputum cx >>neg 03/26/11 flu >> NEG 03/26/11 strep/leg ur ag >>neg  03/26/11 - Urine tox >>NEG  03/27/11 HIV test >>NR 1/10 blood >> ngtd 1/13 >> 1/10 urine >> NEG  Antibiotics: 03/26/11 vanc >>1/5 03/26/11 ceftriaxone >> 04/02/11 03/26/11 moxi >>1/5 03/26/11 tamiflu >>03/27/11  Tests / Events: 03/26/11 CT Head >>No evidence of acute intracranial hemorrhage, mass lesion, or acute Infarct. 1/4 2 D echo >The cavity size was normal. Wall thickness was increased in a pattern of moderate LVH. Systolic function was moderately reduced. The estimated ejection fraction was in the range of 35% to 40%. Wall motion was normal; 1/4 TEE >>diffuse hypokinesis, EF 40-45% 1/4 venous doppler bilaterally -NEG for VT  1/5 CT Head >>Interval left sphenoid sinusitis. Otherwise, unremarkable examination. 1/5 Renal consult for worsening renal failure -Dr. Arta Silence 1/5 Neuro consult >new onset Seizures   1/5 EEG >> minimal seizure activity 1/5 MPO Ab neg, PR-3 Ab neg, C-ANCA neg, P-ANCA neg, ANA negative 1/6 weaned off pressors , tranx 3 u PRBC 1/7-  Steroid bolus for possible autoimmune cause of renal failure, neuro consulted for seizures 1/9- MRI Brain and C/T/L  spine: normal neuro structures 1/10 - extubated 1/11- witness generalized seizure in AM < 1/14- fever, cloudy urine, large leukocytes, 21-50 WBC, blood and urine cx sent off, no abx restarted  Vital Signs:   Temp:  [98.3 F (36.8 C)-99.7 F (37.6 C)] 98.3 F (36.8 C) (01/14 0800) Pulse Rate:  [88-101] 95  (01/14 0705) Resp:  [20-28] 28  (01/14 0400) BP: (144-181)/(90-108) 180/93 mmHg (01/14 0705) SpO2:  [94 %-98 %] 96 % (01/14 0800)   Physical Examination: Gen: decreased myoclonus. Cognition appears intact HEENT:   OP clear, perrl Neck: supple without masses PULM: crackles in bases CV: RRR, no mgr, no clear JVD AB: distended, BS infrequent, soft, nontender, no hsm Ext: 1+ BUE edema Derm: no rash or skin breakdown Neuro: diffusely weak, no focal deficits, frequent myoclonus   CBC    Component Value Date/Time   WBC 18.2* 04/06/2011 0530   RBC 3.92 04/06/2011 0530   HGB 11.6* 04/06/2011 0530   HCT 35.0* 04/06/2011 0530   PLT 274 04/06/2011 0530   MCV 89.3 04/06/2011 0530   MCH 29.6 04/06/2011 0530   MCHC 33.1 04/06/2011 0530   RDW 14.4 04/06/2011 0530   LYMPHSABS 2.1 03/28/2011 1545   MONOABS 0.7 03/28/2011 1545   EOSABS 0.3 03/28/2011 1545   BASOSABS 0.1 03/28/2011 1545    Comprehensive Metabolic Panel:    Component Value Date/Time   NA 141 04/06/2011 0530   K 3.9 04/06/2011 0530   CL 105 04/06/2011 0530   CO2 23 04/06/2011 0530   BUN 83* 04/06/2011 0530  CREATININE 2.57* 04/06/2011 0530   GLUCOSE 91 04/06/2011 0530   CALCIUM 8.4 04/06/2011 0530   AST 30 04/06/2011 0530   ALT 14 04/06/2011 0530   ALKPHOS 63 04/06/2011 0530   BILITOT 0.2* 04/06/2011 0530   PROT 5.2* 04/06/2011 0530   ALBUMIN 1.7* 04/06/2011 0530    PCT and Lactic Acid wnl  C Diff PCR pending  Assessment and Plan:  21 year old female with cardiac arrest due to respiratory arrest, who has significant cardiomyopathy and HTN, with presumed acute renal failure, now extubated, and recovering renal function  (unknown baseline creatinine). Illness preceded by edema and leg/abd swelling for a month or 2 PTA)  1. VDRF - post arrest, resolved.  2. Cardiac - Cardiomyopathy with reduced systolic function, EF 40-45% (Echo 03/27/2011). Suspect HTN-cardiomyopathy. Was flu negative, ? 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 Renal:  AKI, cause unknown. precipitous rise in creatinine but now improving.  Unclear if steroids mediated rise in Cr or not. Possible HTN injury vs primary renal disease (has non-nephrotic range proteinuria)  Lab 04/06/11 0530 04/05/11 0503 04/04/11 0501 04/03/11 0450 04/02/11 0515  CREATININE 2.57* 2.56* 2.54* 2.80* 3.43*  - Cont diurese as per renal service - creatinine unchanged and has reached plateau, proteinuria but in non-nephrotic range - appreciate renal input, considering renal biopsy when medically stable  4. ID - febrile overnight, with U/A suggesting UTI, though with foley. Urine and Blood cx sent. Afebrile overnight and WBC improved.   Lab 04/06/11 0530 04/05/11 0503 04/04/11 0501 04/03/11 0450 04/02/11 0515  WBC 18.2* 22.1* 21.3* 15.1* 13.4*  -follow up cultures, and hold off abx -foley removed but had to be replaced for retention  5. Neuro - seizures, myoclonus. Also with severe B LE weakness.  - neuro following. Anticonvulsants adjusted (Depacon 1000 mg q12 h) -spasticity improved but remains very weak   6. Heme Anemia (03/26/2011) No indication for PRBCs presently  7. Diet:  Dysphagia diet started 1/13   [facility]  Attending Addendum:  I have examined patient, discussed data, plans with Resident Physician. I agree with the note above.  Levy Pupa, MD, PhD 04/06/2011, 11:55 AM [facility] [phone] or if no answer [phone]

## 2011-04-06 NOTE — Progress Notes (Signed)
eLink Physician-Brief Progress Note Patient Name: Heather Sims DOB: [birth year] MRN: [id number]  Date of Service  04/06/2011   HPI/Events of Note  Fever, cloudy urine   eICU Interventions  Pan culture Sepsis biomarkers Renal function Will hold off starting antibiotics   Intervention Category Major Interventions: Other:  Graciella Arment 04/06/2011, 4:46 AM

## 2011-04-06 NOTE — Progress Notes (Signed)
*  PRELIMINARY RESULTS*  Renal Artery Doppler has been performed. No significant Renal Artery stenosis seen. Right kidney measures 11 cm. Left kidney measures 11.7 cm. Bilateral kidneys appears hyperechoic.  Farrel Demark RDMS 04/06/2011, 3:45 PM

## 2011-04-06 NOTE — Progress Notes (Signed)
Renal Daily Progress Note  Subjective: Patient in bed sleeping but awakes to voice.  She complains of belly pain.  Says she feels like her leg edema is a little better Overnight she had low grade fever and urine sent for UA and culture.   Foley has been removed and she cannot tell when she has to void Objective:  Vital signs in last 24 hours:  Temp:  [98.6 F (37 C)-99.7 F (37.6 C)] 99.6 F (37.6 C) (01/14 0400) Pulse Rate:  [88-101] 94  (01/14 0600) Resp:  [20-28] 28  (01/14 0400) BP: (144-181)/(90-113) 180/101 mmHg (01/14 0600) SpO2:  [94 %-98 %] 95 % (01/14 0600) Weight change:   Intake/Output: I/O last 3 completed shifts: In: 778 [P.O.:30; I.V.:200; Other:360; IV Piggyback:188] Out: 1735 [Urine:1285; Emesis/NG output:450]  EXAM: Slow to speak bu very appropriate, oriented to person and place.  CVS: RRR, 2/6 systolic murmur RS- Grossly clear lungs ABD: +BS, soft, but diffusely tender to palpation EXT- 1+ edema Neuro: awake and alert, still some myoclonus in feet.   Lab Results:  Basename 04/06/11 0530 04/05/11 0503 04/04/11 0501  WBC 18.2* 22.1* 21.3*  HGB 11.6* 12.2 11.4*  HCT 35.0* 36.4 34.8*  PLT 274 298 290   BMET  Basename 04/06/11 0530 04/05/11 0503 04/04/11 0501  NA 141 143 140  K 3.9 4.3 4.5  CL 105 104 105  CO2 23 26 22   GLUCOSE 91 110* 80  BUN 83* 82* 69*  CREATININE 2.57* 2.56* 2.54*  CALCIUM 8.4 8.8 8.6  PHOS 5.9* 7.1* 5.8*   LFT  Basename 04/06/11 0530  PROT 5.2*  ALBUMIN 1.7*  AST 30  ALT 14  ALKPHOS 63  BILITOT 0.2*  BILIDIR --  IBILI --   PTH: Lab Results  Component Value Date   PTH 250.2* 03/28/2011   CALCIUM 8.4 04/06/2011   CAION 1.09* 03/26/2011   PHOS 5.9* 04/06/2011  Renal and Serologic Work up: (negative to date except for proteinuria)  HIV Neg  ANA Neg, DSDNA ab 22 (WNL) ASO 87 (WNL)  Diract Antiglobulin complement and IgG Neg  Total complement high, C3, C4 Normal  PTH 250.2 (H)  Kappa Light Chains 8.6 (H) Lamda  Light Chains 5.8(H), Ratio 1.46 (WNL)  ANCA Negative  Urine ZOX:WRUEA 870 mg/gm creat 04/04/11; last UA >300 pro, 7-10 RBC (vs TNTC RBC on initial UA)  SPEP:  Total Protien 3.5 (L)  Albumin 45.1 (L)  Alpha 1 globulin 10.8 (H)  Alpha 2 globulin 19.2 (H)  Beta Globulin 5.8  Beta 2 5.1  Gamma Globulin 14.0  M Spike not detected  UPEP:  Tpro 33.4  Albumin Detected  Alpha 1 Detected  Alpha 2 Detected  Beta Detected  Gamma globulin Detected  Cryoglobulin REPORT Comments: (NOTE) Cryoglobulin, QL Analysis None Detected  Pending:  Glomerular Basement membrane   Studies/Results: No new imaging.    Assessment/Plan: 21 year old female with cardiac arrest due to respiratory arrest, who has significant cardiomyopathy and HTN, with presumed acute renal failure, now extubated, and recovering renal function (unknown baseline creatinine; 2.31 on admission; nadir 1.95; peak 4.86) Illness preceded by edema and leg/abd swelling for a month or 2 PTA)  1) Renal Failure- Renal function essentially unchanged from yesterday, with >1L UOP. Renal function basically the same past 3 days. Patient's renal failure and heart failure/plm edema and respiratory arrest may have all been due to malignant HTN, but primary renal disease has not been ruled out and remain suspicious of same. As  blood pressure is controlled for a period of time, proteinuria may resolve. There was concern for systemic disease, autoimmune top of the list, but nothing in serologic workup so far to point to a specific etiology. She has proteinuria, but is not nephrotic at least by QMV:HQION ratio. Still has some microhematuria   Steroid trial was d/c'd (got 3 days of solumedrol earlier in the week) . No indication for RRT. Can consider renal biopsy when stable if persistent proteinuria, evidence of nephrotic proteinuria or active sediment, or failure to return to normal renal function in setting of controlled HTN. Not stable enough at this time to  pursue. Now on po lasix for her edema. UOP will be hard to gauge with foley out d/t urinary incontinence 2) ID- pt with low grade fever, but has had low grade multiple times through hospital course.  UA sent and + for protein and leuks, but micro showing many epithelial cells, likely not clean catch.  Sent for culture, will monitor. Blood cultures obtained too.  3) Abdominal pain- pt is having diarrhea and abdominal pain, concern for c-diff, PCR pending.  2) Pulm - ? PNA vs non cardiogenic pulmonary edema- CXR with some worsening left base atelectasis; pulm edema has resolved; WBC is increasing; ? Aspiration - per primary service  3) Neuro- Now on Klonopin and Depakote for seizures/myoclonus; neuro following  4) Heme- hemoglobinstable since transfusion on 03/29/11. Ordered Coomb's and hemolysis workup. No thrombocytopenia.  5) CV- CCM is adjusting BP meds  CHAMBERLAIN,RACHEL 04/06/2011 8:04 AM  I have seen and examined this patient and agree with the assessment/plan as outlined above by Ardyth Gal M.D. (PGY 2). Plan to continue following her renal function test we engage with blood pressure control. Creatinine appears to have established a new baseline and multiple serologies are back including negative ANCA. Renal biopsy will be likely undertaken following clinical stability at this time appears probably as an outpatient. No acute electrolyte concerns or renal replacement therapy indications. Volume status appears to be fair with some edema. Mental status significantly better from prior notes. Abbigail Anstey K.,MD 04/06/2011 10:48 AM

## 2011-04-06 NOTE — Progress Notes (Signed)
Subjective: No complaints. No overnight events.  Objective: Current vital signs: BP 180/93  Pulse 95  Temp(Src) 99.6 F (37.6 C) (Oral)  Resp 28  Ht 5\' 2"  (1.575 m)  Wt 67.3 kg (148 lb 5.9 oz)  BMI 27.14 kg/m2  SpO2 96%  LMP 03/02/2011  Neurologic Exam: Alert. No distress. Able to follow commands well. Verbal speech is starting to return including ability to phonate with 1-2 syllable responses. No facial weakness Patient is exhibiting a return of voluntary movement of her lower extremities. She is able to dorsiflex as well as plantar flex both feet voluntarily. She also has voluntary proximal movements with withdrawal-type movements. There is mild clonus with proximal lower extremity voluntary movements.  Medications:  Scheduled:   . antiseptic oral rinse  15 mL Mouth Rinse BID  . clonazePAM  0.5 mg Oral Q6H  . cloNIDine  0.1 mg Transdermal Weekly  . furosemide  120 mg Oral BID  . heparin  5,000 Units Subcutaneous Q8H  . hydrALAZINE  20 mg Intravenous Q6H  . metoprolol tartrate  50 mg Oral BID  . pantoprazole (PROTONIX) IV  40 mg Intravenous Q12H  . sodium chloride  10 mL Intracatheter Q12H  . sodium chloride  3 mL Intravenous Q12H  . valproate sodium  1,000 mg Intravenous Q12H  . DISCONTD: hydrALAZINE  10 mg Intravenous Q6H  . DISCONTD: valproate sodium  750 mg Intravenous Q12H    Assessment/Plan: 1. Hypoxic encephalopathy, improving, including mental status with return of speech. 2. Generalized seizures, controlled with Depacon 1000 mg every 12 hours. 3. Lower extremity spastic weakness, improving.  Plan: No change in current medications for control of seizures and mild clonus. Physical therapy intervention for lower extremity weakness. Continued SLP intervention for dysphagia and speech difficulty. We will continue to follow as well.  C.R. Roseanne Reno, MD [facility]  04/06/2011  8:19 AM

## 2011-04-06 NOTE — Progress Notes (Signed)
Physical Therapy Evaluation Patient Details Name: Heather Sims MRN: [id number] DOB: 02-13-1991 Today's Date: 04/06/2011  Problem List:  Patient Active Problem List  Diagnoses  . Respiratory failure  . AKI (acute kidney injury)  . Acute heart failure  . Hypoxemia  . Pneumonia  . Anemia    Past Medical History:  Past Medical History  Diagnosis Date  . Pneumonia last 2 weeks    'walking pneumonia'   Past Surgical History: No past surgical history on file.  PT Assessment/Plan/Recommendation PT Assessment Clinical Impression Statement: Pt tolerated treatment today. She has myoclonus throughout entire body which can be seen during movement. Pt is very weak and unable to move against gravity in most of her muscles. Her coordination is very impaired partially because of the myoclonus but her cognition is good and can understand what we are trying to have her do during treatment. Her mother will be with her when she goes home to help her daily. Recommending pt go to inpatient rehab prior to d/c. PT Recommendation/Assessment: Patient will need skilled PT in the acute care venue PT Problem List: Decreased strength;Decreased range of motion;Decreased activity tolerance;Decreased balance;Decreased mobility;Decreased coordination Problem List Comments: myoclonus throughout body affecting movements  PT Therapy Diagnosis : Difficulty walking;Generalized weakness PT Plan PT Frequency: Min 3X/week PT Treatment/Interventions: Gait training;Functional mobility training;Therapeutic activities;Therapeutic exercise;Balance training;Neuromuscular re-education;Patient/family education;Wheelchair mobility training PT Recommendation Recommendations for Other Services: OT consult;Speech consult; Rehab c/s Follow Up Recommendations: Inpatient Rehab Equipment Recommended: Defer to next venue PT Goals  Acute Rehab PT Goals PT Goal Formulation: With patient/family Time For Goal Achievement: 2 weeks Pt  will Roll Supine to Right Side: with rail;with cues (comment type and amount);with supervision PT Goal: Rolling Supine to Right Side - Progress: Other (comment) Pt will Roll Supine to Left Side: with rail;with supervision;with cues (comment type and amount) PT Goal: Rolling Supine to Left Side - Progress: Other (comment) Pt will go Supine/Side to Sit: with mod assist;with HOB 0 degrees;with rail;with cues (comment type and amount) PT Goal: Supine/Side to Sit - Progress: Other (comment) Pt will Sit at [facility] of Bed: with min assist;3-5 min;with supervision PT Goal: Sit at Edge Of Bed - Progress: Other (comment) Pt will go Sit to Supine/Side: with mod assist;with HOB 0 degrees;with rail;with cues (comment type and amount) PT Goal: Sit to Supine/Side - Progress: Other (comment) Pt will go Sit to Stand: with mod assist;with cues (comment type and amount) PT Goal: Sit to Stand - Progress: Other (comment) Pt will Transfer Bed to Chair/Chair to Bed: with mod assist;with cues (comment type and amount) PT Transfer Goal: Bed to Chair/Chair to Bed - Progress: Other (comment)  PT Evaluation Precautions/Restrictions   Fall  Prior Functioning  Home Living Lives With: Family Receives Help From: Family, mother will be at home to help 24/7 Type of Home: House Home Layout: One level Home Access: Stairs to enter Entrance Stairs-Rails: None Entrance Stairs-Number of Steps: 2 Bathroom Shower/Tub: Engineer, manufacturing systems: Standard Bathroom Accessibility: Yes How Accessible: Accessible via wheelchair Home Adaptive Equipment: None Prior Function Level of Independence: Independent with basic ADLs;Independent with gait;Independent with transfers Driving: Yes Vocation: Student Cognition Cognition Arousal/Alertness: Awake/alert Overall Cognitive Status: Appears within functional limits for tasks assessed Orientation Level: Oriented X4 Sensation/Coordination Sensation Light Touch: Not  tested Coordination Gross Motor Movements are Fluid and Coordinated: No Fine Motor Movements are Fluid and Coordinated: No Coordination and Movement Description: myoclonus with movement over entire body, trouble initiating movement and then controlling movements velocity  and direction after initiation  Extremity Assessment RUE Strength Right Shoulder Flexion: 3+/5 Right Shoulder Extension: 3+/5 LUE Strength Left Shoulder Flexion: 3+/5 Left Shoulder Extension: 3+/5 RLE Strength Right Hip Flexion: 2+/5 Right Hip Extension: 2+/5 Right Knee Flexion: 3-/5 Right Knee Extension: 3-/5 Right Ankle Dorsiflexion: 3/5 Right Ankle Plantar Flexion: 3/5 LLE Strength Left Hip Flexion: 2+/5 Left Hip Extension: 2+/5 Left Knee Flexion: 3-/5 Left Knee Extension: 3-/5 Left Ankle Dorsiflexion: 3/5 Left Ankle Plantar Flexion: 3/5 Mobility (including Balance) Bed Mobility Bed Mobility: Yes Rolling Right: 3: Mod assist;With rail;Patient percentage (comment) Rolling Right Details (indicate cue type and reason): patient able to reach across body to grab onto rail to help with roll, pt has myoclonus throughout entire motion; pt = 60%  Rolling Left: 3: Mod assist;With rail;Patient percentage (comment) Rolling Left Details (indicate cue type and reason): very similar to R rolling, pt = 60% Supine to Sit: Patient percentage (comment);1: +2 Total assist Supine to Sit Details (indicate cue type and reason): pt does not have much trunk musculature to help with sitting up, pt = 50% Transfers Transfers: Yes Sit to Stand: Patient percentage (comment);1: +2 Total assist Sit to Stand Details (indicate cue type and reason): pt = 20%, having myoclonus in legs and body when starting to stand  Stand to Sit: 1: +2 Total assist Stand to Sit Details: pt = 20%, having the same myoclonus in legs and body during transfer  Squat Pivot Transfers: 1: +2 Total assist;Patient percentage (comment) Squat Pivot Transfer Details  (indicate cue type and reason): pt = 20% with transfer, has myoclonus during movement.  Ambulation/Gait Ambulation/Gait: No Stairs: No Wheelchair Mobility Wheelchair Mobility: No  Posture/Postural Control Posture/Postural Control: No significant limitations Balance Balance Assessed: Yes Static Sitting Balance Static Sitting - Balance Support: Bilateral upper extremity supported;Feet supported Static Sitting - Level of Assistance: 3: Mod assist End of Session PT - End of Session Equipment Utilized During Treatment: Gait belt Activity Tolerance: Patient tolerated treatment well Patient left: in chair;with call bell in reach;with family/visitor present Nurse Communication: Mobility status for transfers;Need for lift equipment (stated would need 2 people or could use a lift ) General Behavior During Session: [facility] for tasks performed Cognition: [facility] for tasks performed  Elvera Bicker 04/06/2011, 1:52 PM [facility] [phone] (pager)

## 2011-04-07 ENCOUNTER — Inpatient Hospital Stay (HOSPITAL_COMMUNITY): Payer: Medicaid Other

## 2011-04-07 DIAGNOSIS — G931 Anoxic brain damage, not elsewhere classified: Secondary | ICD-10-CM

## 2011-04-07 DIAGNOSIS — I10 Essential (primary) hypertension: Secondary | ICD-10-CM | POA: Diagnosis not present

## 2011-04-07 DIAGNOSIS — N179 Acute kidney failure, unspecified: Secondary | ICD-10-CM

## 2011-04-07 DIAGNOSIS — I429 Cardiomyopathy, unspecified: Secondary | ICD-10-CM | POA: Diagnosis not present

## 2011-04-07 DIAGNOSIS — I501 Left ventricular failure: Secondary | ICD-10-CM

## 2011-04-07 DIAGNOSIS — G40309 Generalized idiopathic epilepsy and epileptic syndromes, not intractable, without status epilepticus: Secondary | ICD-10-CM

## 2011-04-07 LAB — CBC
HCT: 32.5 % — ABNORMAL LOW (ref 36.0–46.0)
MCH: 29.2 pg (ref 26.0–34.0)
MCHC: 32.9 g/dL (ref 30.0–36.0)
MCV: 88.8 fL (ref 78.0–100.0)
RDW: 14.3 % (ref 11.5–15.5)

## 2011-04-07 LAB — BASIC METABOLIC PANEL
BUN: 85 mg/dL — ABNORMAL HIGH (ref 6–23)
Calcium: 7.9 mg/dL — ABNORMAL LOW (ref 8.4–10.5)
Creatinine, Ser: 2.54 mg/dL — ABNORMAL HIGH (ref 0.50–1.10)
GFR calc non Af Amer: 26 mL/min — ABNORMAL LOW (ref >90)
Glucose, Bld: 89 mg/dL (ref 70–99)
Sodium: 139 mEq/L (ref 135–145)

## 2011-04-07 NOTE — Progress Notes (Addendum)
Renal Daily Progress Note  Subjective: Patient in bed but awake.  Denies belly pain.  Foley was replaced, and she is unaware of this, thinks her diarrhea is slowing down (this is contrary to nursing report) Objective:  Vital signs in last 24 hours:  Temp:  [97.4 F (36.3 C)-99.5 F (37.5 C)] 98.1 F (36.7 C) (01/15 0410) Pulse Rate:  [92-101] 92  (01/14 1600) Resp:  [25] 25  (01/14 1200) BP: (128-197)/(74-120) 132/78 mmHg (01/15 0500) SpO2:  [95 %-98 %] 95 % (01/15 0410) Weight:  [146 lb 2.6 oz (66.3 kg)] 146 lb 2.6 oz (66.3 kg) (01/15 0200) Weight change: -2 lb 13.9 oz (-1.3 kg)  Intake/Output: I/O last 3 completed shifts: In: 710 [P.O.:60; I.V.:460; IV Piggyback:190] Out: 1420 [Urine:1420]  EXAM: Slow to speak but very appropriate, oriented to person and place.  CVS: RRR, 2/6 systolic murmur RS- Grossly clear lungs ABD: +BS, soft, but non-tender to palpation EXT: trace edema Neuro: awake and alert, still some myoclonus in feet.   Lab Results:  Basename 04/07/11 0500 04/06/11 0530 04/05/11 0503  WBC 18.6* 18.2* 22.1*  HGB 10.7* 11.6* 12.2  HCT 32.5* 35.0* 36.4  PLT 248 274 298   BMET  Basename 04/07/11 0500 04/06/11 0530 04/05/11 0503  NA 139 141 143  K 3.6 3.9 4.3  CL 106 105 104  CO2 21 23 26   GLUCOSE 89 91 110*  BUN 85* 83* 82*  CREATININE 2.54* 2.57* 2.56*  CALCIUM 7.9* 8.4 8.8  PHOS 6.6* 5.9* 7.1*   LFT  Basename 04/06/11 0530  PROT 5.2*  ALBUMIN 1.7*  AST 30  ALT 14  ALKPHOS 63  BILITOT 0.2*  BILIDIR --  IBILI --   PTH: Lab Results  Component Value Date   PTH 250.2* 03/28/2011   CALCIUM 7.9* 04/07/2011   CAION 1.09* 03/26/2011   PHOS 6.6* 04/07/2011  Renal and Serologic Work up: (negative to date except for proteinuria)  HIV Neg  ANA Neg, DSDNA ab 22 (WNL) ASO 87 (WNL)  Diract Antiglobulin complement and IgG Neg  Total complement high, C3, C4 Normal  PTH 250.2 (H)  Kappa Light Chains 8.6 (H) Lamda Light Chains 5.8(H), Ratio 1.46 (WNL)    ANCA Negative  Urine OZH:YQMVH 870 mg/gm creat 04/04/11; last UA >300 pro, 7-10 RBC (vs TNTC RBC on initial UA)  SPEP:  Total Protien 3.5 (L)  Albumin 45.1 (L)  Alpha 1 globulin 10.8 (H)  Alpha 2 globulin 19.2 (H)  Beta Globulin 5.8  Beta 2 5.1  Gamma Globulin 14.0  M Spike not detected  UPEP:  Tpro 33.4  Albumin Detected  Alpha 1 Detected  Alpha 2 Detected  Beta Detected  Gamma globulin Detected  Cryoglobulin REPORT Comments: (NOTE) Cryoglobulin, QL Analysis None Detected  Pending:  Glomerular Basement membrane   Studies/Results: Renal Artery Doppler (prelim): No significant RAS, bilateral hyperechoic appearance of kidneys.    Assessment/Plan: 21 year old female with cardiac arrest due to respiratory arrest, who has significant cardiomyopathy and HTN, with presumed acute renal failure, now extubated, and recovering renal function (unknown baseline creatinine; 2.31 on admission; nadir 1.95; peak 4.86) Illness preceded by edema and leg/abd swelling for a month or 2 PTA)  1) Renal Failure- Renal function essentially unchanged from yesterday, with 1400 cc's UOP. Renal function basically the same past 3 days, may be reaching a baseline. Patient's renal failure and heart failure/plm edema and respiratory arrest may have all been due to malignant HTN, but primary renal disease has  not been ruled out and remain suspicious of same. As blood pressure is controlled for a period of time, proteinuria may resolve. There was concern for systemic disease, autoimmune top of the list, but nothing in serologic workup so far to point to a specific etiology. She has proteinuria, but is not nephrotic at least by WUJ:WJXBJ ratio. Still has some microhematuria.    Can consider renal biopsy when stable if persistent proteinuria, evidence of nephrotic proteinuria or active sediment, or failure to return to normal renal function in setting of controlled HTN. Not stable enough at this time to pursue. Now on po  lasix for her edema, foley back in, will continue to monitor strict in's and out's and daily labs 2) ID- UA sent and + for protein and leuks.  Sent for culture, will monitor. Blood cultures obtained too.  3) Abdominal pain- pt is still having diarrhea, c- diff is negative and pain improving.  4) Pulm - ? PNA vs non cardiogenic pulmonary edema- CXR with some worsening left base atelectasis; pulm edema has resolved; WBC is increasing; ? Aspiration - per primary service  5) Neuro- Now on Klonopin and Depakote for seizures/myoclonus; neuro following  6) Heme- hemoglobinstable since transfusion on 03/29/11. Ordered Coomb's and hemolysis workup. No thrombocytopenia.  7) CV- CCM is adjusting BP meds 8) Disposition- PT recs in patient rehab prior to going home, seems very appropriate in this patient.   CHAMBERLAIN,RACHEL 04/07/2011 7:08 AM  I have seen and examined this patient and agree with the assessment/plan as outlined above by Lula Olszewski MD (PGY 2). Renal function has seemingly plateaued and no indications noted for HD. UOP is fair. Plan to undertake diagnostic renal biopsy in a week or so to establish diagnosis if creatinine unimproved or worsens. Krina Mraz K.,MD 04/07/2011 9:37 AM

## 2011-04-07 NOTE — Progress Notes (Signed)
Name: Heather Sims MRN: [id number] DOB: [birth year]  LOS: 12  CRITICAL CARE PROGRESS NOTE  PT PROFILE: 21 y/o W with no PMH presented via EMS to the [facility] ED 1/03 after cardiorespiratory arrest - sudden onset of SOB followed by PEA arrest and 5 minutes of pulselessness. Had recently been diagnosed with hypertension in the weeks prior to admission.  Underwent hypothermia protocol. Suffered seizures upon rewarming  Overnight Events: - No events overnight, BP improved  Lines / Drains: 03/26/11 ETT >> 1/10 03/26/11 R IJ CVL >> 1/11 03/26/11 R fem CVL >>1/4 03/26/11 L radial a-line >>1/8 04/03/11 LUE PICC >>   Cultures / Sepsis markers: 1/14 blood cx >>>NGTD 1/14 urine cx >>> 1/14 C diff PCR >>neg 03/26/11 blood cx x2 >>neg 03/26/11 sputum cx >>neg 03/26/11 flu >> NEG 03/26/11 strep/leg ur ag >>neg  03/26/11 - Urine tox >>NEG  03/27/11 HIV test >>NR 1/10 blood >> ngtd 1/13  1/10 urine >> NEG  Antibiotics: 03/26/11 vanc >>1/5 03/26/11 ceftriaxone >> 04/02/11 03/26/11 moxi >>1/5 03/26/11 tamiflu >>03/27/11  Tests / Events: 03/26/11 CT Head >>No evidence of acute intracranial hemorrhage, mass lesion, or acute Infarct. 1/4 2 D echo >The cavity size was normal. Wall thickness was increased in a pattern of moderate LVH. Systolic function was moderately reduced. The estimated ejection fraction was in the range of 35% to 40%. Wall motion was normal; 1/4 TEE >>diffuse hypokinesis, EF 40-45% 1/4 venous doppler bilaterally -NEG for VT  1/5 CT Head >>Interval left sphenoid sinusitis. Otherwise, unremarkable examination. 1/5 Renal consult for worsening renal failure -Dr. Arta Silence 1/5 Neuro consult >new onset Seizures   1/5 EEG >> minimal seizure activity 1/5 MPO Ab neg, PR-3 Ab neg, C-ANCA neg, P-ANCA neg, ANA negative 1/6 weaned off pressors , tranx 3 u PRBC 1/7-  Steroid bolus for possible autoimmune cause of renal failure, neuro consulted for seizures 1/9- MRI Brain and C/T/L spine: normal neuro structures 1/10 -  extubated 1/11- witness generalized seizure in AM < 1/14- fever, cloudy urine, large leukocytes, 21-50 WBC, blood and urine cx sent off, no abx restarted  Vital Signs:   Temp:  [97.4 F (36.3 C)-99.5 F (37.5 C)] 98.4 F (36.9 C) (01/15 0738) Pulse Rate:  [85-101] 85  (01/15 0738) Resp:  [25] 25  (01/14 1200) BP: (128-197)/(74-120) 154/90 mmHg (01/15 0700) SpO2:  [95 %-98 %] 96 % (01/15 0738) Weight:  [146 lb 2.6 oz (66.3 kg)] 146 lb 2.6 oz (66.3 kg) (01/15 0200)   Physical Examination: Gen: Cognition appears intact HEENT:   [place]/AT, PERRL Neck: supple without masses PULM: crackles in bases CV: RRR, no mgr, no clear JVD AB: distended, BS infrequent, soft, nontender, no hsm Ext: 1+ BUE edema Derm: no rash or skin breakdown Neuro: diffusely weak, no focal deficits, A&O x3   CBC    Component Value Date/Time   WBC 18.6* 04/07/2011 0500   RBC 3.66* 04/07/2011 0500   HGB 10.7* 04/07/2011 0500   HCT 32.5* 04/07/2011 0500   PLT 248 04/07/2011 0500   MCV 88.8 04/07/2011 0500   MCH 29.2 04/07/2011 0500   MCHC 32.9 04/07/2011 0500   RDW 14.3 04/07/2011 0500   LYMPHSABS 2.1 03/28/2011 1545   MONOABS 0.7 03/28/2011 1545   EOSABS 0.3 03/28/2011 1545   BASOSABS 0.1 03/28/2011 1545    Comprehensive Metabolic Panel:    Component Value Date/Time   NA 139 04/07/2011 0500   K 3.6 04/07/2011 0500   CL 106 04/07/2011 0500   CO2 21  04/07/2011 0500   BUN 85* 04/07/2011 0500   CREATININE 2.54* 04/07/2011 0500   GLUCOSE 89 04/07/2011 0500   CALCIUM 7.9* 04/07/2011 0500   AST 30 04/06/2011 0530   ALT 14 04/06/2011 0530   ALKPHOS 63 04/06/2011 0530   BILITOT 0.2* 04/06/2011 0530   PROT 5.2* 04/06/2011 0530   ALBUMIN 1.7* 04/06/2011 0530    PCT and Lactic Acid wnl    Assessment and Plan:  21 year old female with cardiac arrest due to respiratory arrest, who has significant cardiomyopathy and HTN, with acute renal failure, now extubated 1/10, and recovering renal function (unknown baseline  creatinine). Illness preceded by edema and leg/abd swelling for a month or 2 PTA)  1. VDRF - post arrest, resolved.  2. Cardiac - Cardiomyopathy with reduced systolic function, EF 40-45% (Echo 03/27/2011). Suspect HTN-cardiomyopathy. Was flu negative, ? 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 = hydralazine + metop + clonidine - Renal artery stenosis U/S negative on 1/14 - Transfer to tele and to [facility] as of 1/16  3. Renal:  AKI, cause unknown. precipitous rise in creatinine but now improving.  Unclear if steroids mediated rise in Cr or not. Possible HTN injury vs primary renal disease (has non-nephrotic range proteinuria)  Lab 04/07/11 0500 04/06/11 0530 04/05/11 0503 04/04/11 0501 04/03/11 0450  CREATININE 2.54* 2.57* 2.56* 2.54* 2.80*  - Cont diurese as per renal service - creatinine unchanged and has reached plateau - appreciate renal input, considering renal biopsy when medically stable to r/o intrinsic renal dz  4. ID - febrile overnight 1/13. U/A suggesting UTI, though with foley. Urine and Blood cx sent. Afebrile overnight and WBC improved.   Lab 04/07/11 0500 04/06/11 0530 04/05/11 0503 04/04/11 0501 04/03/11 0450  WBC 18.6* 18.2* 22.1* 21.3* 15.1*  -follow up cultures, and hold off abx -foley removed but had to be replaced for retention; will attempt to remove again 1/15  5. Neuro - seizures, myoclonus. Also with severe B LE weakness.  - neuro following. Anticonvulsants adjusted (Depacon 1000 mg q12 h) -spasticity improved but remains very weak  -no role for benzos at this time  6. Heme Anemia (03/26/2011)  Lab 04/07/11 0500 04/06/11 0530 04/05/11 0503 04/04/11 0501 04/03/11 0450  HGB 10.7* 11.6* 12.2 11.4* 9.7*  HCT 32.5* 35.0* 36.4 34.8* 29.5*  ]-Patient currently menstruating likely explaining drop in Hgb -Continue to monitor. No indication for PRBCs presently  7. Diet:  Dysphagia  diet started 1/13   [facility]  Attending Addendum:  I have examined patient, discussed data, plans with Resident Physician. I agree with the note above.  Levy Pupa, MD, PhD 04/07/2011, 10:01 AM [facility] [phone] or if no answer [phone]

## 2011-04-07 NOTE — Progress Notes (Addendum)
Speech Pathology: Dysphagia Treatment Note  Patient was observed with : Pureed, mechanical soft and Thin liquids.  Patient was noted to have s/s of aspiration : No  Lung Sounds:  Clear per RN shift assessment Temperature: 98.3  Patient required: moderate-max verbal, visual, and tactile cues to consistently follow precautions/strategies  Clinical Impression: Patient seen for skilled clinical observation with pos to evaluate for diet tolerance and assess potential to advance. Patient and mother eager to advance solids from puree. Patient more alert than 1/14 however continues to require moderate verbal cueing to maintain appropriate level of alertness. Myoclonus present however not appearing to affect swallow. Clinician provided po trials above. Patient without s/s of aspiration or pharyngeal dysphagia however presents with a moderate primary oral phase dysphagia characterized by spasticity resulting in munch-like mastication with decreased bolus cohesion, delayed A-P transit, and moderate oral residuals which patient required mod-max cues to clear using lingual sweep. Oral care complete by SLP to clear remaining mild residuals following po trials. Education provided by patient and mother regarding diet upgrade and aspiration precautions.   *Patient presents with moderate confusion today, decreased attention. GIven risk of anoxic injury s/p cardiac arrest, patient would benefit from cognitive-linguistic evaluation. MD please order if agree.   Recommendations:  1.  Dys 2 (chopped), thin liquids 2.  Small bites and sips-straws ok 3. Ensure patient has swallowed solids before offering liquids 4. Oral care after pos.  5. meds crushed in puree 6. Full supervision  Pain:   none Intervention Required:   No   Goals: Progressing  Ferdinand Lango MA, CCC-SLP [phone]

## 2011-04-07 NOTE — Consult Note (Signed)
Physical Medicine and Rehabilitation Consult Reason for Consult: Deconditioning/cardiorespiratory arrest Referring Phsyician: Critical care medicine Heather Sims is an 21 y.o. female.   HPI: 21 year old right-handed female with recent diagnosis of hypertension as well as upper respiratory infection which she had been placed on Avelox. Admitted January 3 with sudden onset shortness of breath and cardiorespiratory failure with CPR in the field. She was pulseless for approximately 5 minutes and intubated in the emergency department. Underwent  hypothermia protocol and suffered seizures upon rewarming. Cranial CT scan and MRI of the brain showed no acute changes. Echocardiogram with ejection fraction 40% and systolic function moderately reduced. TEE with diffuse hypokinesis ejection fraction 45%. Venous Doppler studies lower tremor his were negative. Placed on subcutaneous heparin for deep vein thrombosis prophylaxis. EEG was minimal seizure activity. MRI of cervical thoracic lumbar spine unremarkable. Noted hemoglobin 8.1 and creatinine 2.31 upon admission she was transfused 3 units of packed red blood cells. Renal ultrasound was negative for hydronephrosis. Nephrology consulted felt renal insufficiency due to hypertension. Latest creatinine 2.54 felt to be stabilized. Placed on valproate 1000 mg every 12 hours per neurology services for seizure disorder. No further seizure activity has been reported. 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 Physical therapy evaluation completed January 14 as patient was +2 total assist for sit to stand as well as +2 total assist for squat pivot transfers(20%). Occupational therapy evaluations pending. Patient with profound deconditioning thus inpatient rehabilitation services was consulted to consider inpatient rehabilitation  Review of Systems  Constitutional: Positive for malaise/fatigue.  Eyes: Negative for  double vision.  Respiratory: Positive for cough and shortness of breath.   Cardiovascular: Negative for chest pain.  Gastrointestinal: Positive for nausea and constipation.  Genitourinary: Negative for dysuria.  Musculoskeletal: Negative.   Neurological: Positive for seizures and weakness. Negative for dizziness and headaches.  Psychiatric/Behavioral: Negative.    Past Medical History  Diagnosis Date  . Pneumonia last 2 weeks    'walking pneumonia'   No past surgical history on file. No family history on file. Social History:  reports that she has never smoked. She does not have any smokeless tobacco history on file. She reports that she does not drink alcohol or use illicit drugs. Allergies: No Known Allergies Medications Prior to Admission  Medication Dose Route Frequency Provider Last Rate Last Dose  . 0.9 %  sodium chloride infusion   Intravenous Continuous Billy Fischer, MD 15 mL/hr at 03/31/11 1800    . 0.9 %  sodium chloride infusion   Intravenous Continuous Max Fickle, MD 20 mL/hr at 04/06/11 [id number]    . antiseptic oral rinse (BIOTENE) solution 15 mL  15 mL Mouth Rinse BID Max Fickle, MD   15 mL at 04/07/11 0902  . aspirin chewable tablet 324 mg  324 mg Oral NOW Max Fickle, MD       Or  . aspirin suppository 300 mg  300 mg Rectal NOW Max Fickle, MD   300 mg at 03/27/11 0007  . aspirin suppository 300 mg  300 mg Rectal NOW Max Fickle, MD      . chlorproMAZINE (THORAZINE) 25 mg in sodium chloride 0.9 % 25 mL IVPB  25 mg Intravenous Once Rakesh V. Vassie Loll, MD   25 mg at 04/02/11 2140  . chlorproMAZINE (THORAZINE) 25 mg in sodium chloride 0.9 % 25 mL IVPB  25 mg Intravenous Once Elizabeth Deterding   25 mg at 04/03/11 0413  . chlorproMAZINE (THORAZINE) 25  mg in sodium chloride 0.9 % 25 mL IVPB  25 mg Intravenous Once Elizabeth Deterding   25 mg at 04/04/11 0507  . cisatracurium (NIMBEX) bolus via infusion 3 mg  0.05 mg/kg Intravenous Once PRN Max Fickle, MD       . cisatracurium (NIMBEX) injection 6 mg  0.1 mg/kg Intravenous Once Max Fickle, MD   6 mg at 03/26/11 2222  . cisatracurium (NIMBEX) injection 6 mg  0.1 mg/kg Intravenous Once PRN Max Fickle, MD      . cloNIDine (CATAPRES - Dosed in mg/24 hr) patch 0.3 mg  0.3 mg Transdermal Weekly Leslye Peer, MD      . dextrose 50 % solution        25 mL at 03/28/11 1029  . diphenoxylate-atropine (LOMOTIL) 2.5-0.025 MG per tablet 1 tablet  1 tablet Oral Q4H PRN Leslye Peer, MD   1 tablet at 04/07/11 0929  . diphenoxylate-atropine (LOMOTIL) 2.5-0.025 MG per tablet 2 tablet  2 tablet Oral Once Leslye Peer, MD   2 tablet at 04/06/11 1438  . fentaNYL (SUBLIMAZE) bolus via infusion 25 mcg  25 mcg Intravenous STAT Ricki Rodriguez, MD   25 mcg at 03/27/11 1322  . fentaNYL (SUBLIMAZE) bolus via infusion 50 mcg  50 mcg Intravenous STAT Ricki Rodriguez, MD   50 mcg at 03/27/11 1324  . fentaNYL (SUBLIMAZE) injection 100 mcg  100 mcg Intravenous Once Raeford Razor, MD   100 mcg at 03/26/11 2020  . furosemide (LASIX) 160 mg in dextrose 5 % 50 mL IVPB  160 mg Intravenous Q6H Sadie Haber, MD   160 mg at 04/02/11 0515  . furosemide (LASIX) 160 mg in dextrose 5 % 50 mL IVPB  160 mg Intravenous Q8H Sadie Haber, MD   160 mg at 04/03/11 0259  . furosemide (LASIX) 160 mg in dextrose 5 % 50 mL IVPB  160 mg Intravenous BID Ardyth Gal, MD   160 mg at 04/04/11 0827  . furosemide (LASIX) injection 80 mg  80 mg Intravenous Once Maree Krabbe, MD   80 mg at 03/28/11 2355  . furosemide (LASIX) injection 80 mg  80 mg Intravenous Once Ardyth Gal, MD   80 mg at 03/29/11 1247  . furosemide (LASIX) tablet 120 mg  120 mg Oral BID Sadie Haber, MD   120 mg at 04/07/11 0754  . heparin injection 5,000 Units  5,000 Units Subcutaneous Q8H Max Fickle, MD   5,000 Units at 04/07/11 0505  . hydrALAZINE (APRESOLINE) injection 10-40 mg  10-40 mg Intravenous Q4H PRN Billy Fischer, MD   20 mg at  04/06/11 1956  . hydrALAZINE (APRESOLINE) tablet 25 mg  25 mg Oral QID Leslye Peer, MD   25 mg at 04/07/11 0902  . influenza  inactive virus vaccine (FLUZONE/FLUARIX) injection 0.5 mL  0.5 mL Intramuscular Tomorrow-1000 Max Fickle, MD      . influenza  inactive virus vaccine (FLUZONE/FLUARIX) injection 0.5 mL  0.5 mL Intramuscular Tomorrow-1000 Lora Poteet Seay, PHARMD   0.5 mL at 03/31/11 0911  . labetalol (NORMODYNE,TRANDATE) injection 20 mg  20 mg Intravenous STAT Ricki Rodriguez, MD   20 mg at 03/27/11 1330  . labetalol (NORMODYNE,TRANDATE) injection 20 mg  20 mg Intravenous Q2H PRN Madhav Devani   20 mg at 04/06/11 1955  . LORazepam (ATIVAN) 2 MG/ML injection           . LORazepam (ATIVAN) injection 2 mg  2 mg Intravenous  Once Raeford Razor, MD   2 mg at 03/26/11 2020  . LORazepam (ATIVAN) injection 2 mg  2 mg Intravenous Q4H PRN Billy Fischer, MD      . methylPREDNISolone sodium succinate (SOLU-MEDROL) 500 mg in sodium chloride 0.9 % 50 mL IVPB  500 mg Intravenous Daily Maree Krabbe, MD   500 mg at 03/30/11 1017  . metoprolol (LOPRESSOR) tablet 100 mg  100 mg Oral BID Leslye Peer, MD   100 mg at 04/07/11 0902  . midazolam (VERSED) bolus via infusion 1 mg  1 mg Intravenous STAT Ricki Rodriguez, MD   1 mg at 03/27/11 1322  . midazolam (VERSED) bolus via infusion 1 mg  1 mg Intravenous STAT Ricki Rodriguez, MD   1 mg at 03/27/11 1336  . ondansetron (ZOFRAN) injection 4 mg  4 mg Intravenous Q8H PRN Elizabeth Deterding   4 mg at 04/05/11 0108  . pantoprazole (PROTONIX) EC tablet 40 mg  40 mg Oral Q1200 Leslye Peer, MD   40 mg at 04/07/11 1154  . phenytoin (DILANTIN) 1,000 mg in sodium chloride 0.9 % 250 mL IVPB  1,000 mg Intravenous Once Juliann Pulse, PHARMD   1,000 mg at 03/28/11 1424  . potassium chloride 10 mEq in 50 mL *CENTRAL LINE* IVPB  10 mEq Intravenous Q1 Hr x 4 Max Fickle, MD   10 mEq at 04/02/11 1816  . promethazine (PHENERGAN) injection 12.5 mg  12.5 mg  Intravenous Q6H PRN Mcarthur Rossetti. Tyson Alias      . promethazine (PHENERGAN) injection 25 mg  25 mg Intravenous Once Elizabeth Deterding   25 mg at 04/05/11 0221  . propofol (DIPRIVAN) 10 MG/ML infusion  5-70 mcg/kg/min Intravenous Once Raeford Razor, MD 7.2 mL/hr at 03/26/11 2126 20 mcg/kg/min at 03/26/11 2126  . propofol (DIPRIVAN) 10 MG/ML infusion      14.4 mL/hr at 03/27/11 0013 1,000 mg at 03/27/11 0013  . sodium chloride 0.9 % bolus 1,000 mL  1,000 mL Intravenous Once Raeford Razor, MD   1,000 mL at 03/26/11 2010  . sodium chloride 0.9 % bolus 1,000 mL  1,000 mL Intravenous Once Raeford Razor, MD   1,000 mL at 03/26/11 2108  . sodium chloride 0.9 % injection 10 mL  10 mL Intravenous PRN Max Fickle, MD      . sodium chloride 0.9 % injection 10 mL  10 mL Intracatheter Q12H Max Fickle, MD   10 mL at 04/07/11 0902  . sodium chloride 0.9 % injection 10 mL  10 mL Intracatheter PRN Max Fickle, MD      . sodium chloride 0.9 % injection 3 mL  3 mL Intravenous Q12H Max Fickle, MD      . sodium polystyrene (KAYEXALATE) 15 GM/60ML suspension 30 g  30 g Oral Once Ardyth Gal, MD   30 g at 03/29/11 1303  . valproate (DEPACON) 1,000 mg in dextrose 5 % 50 mL IVPB  1,000 mg Intravenous Once Aline Brochure   1,000 mg at 04/03/11 1106  . valproate (DEPACON) 1,000 mg in dextrose 5 % 50 mL IVPB  1,000 mg Intravenous Q12H Carin Hock Stewart   1,000 mg at 04/07/11 0902  . valproate (DEPACON) 500 mg in dextrose 5 % 50 mL IVPB  500 mg Intravenous Once Aline Brochure   500 mg at 04/04/11 1111  . vancomycin (VANCOCIN) IVPB 1000 mg/200 mL premix  1,000 mg Intravenous Once Raeford Razor, MD   1,000 mg at 03/26/11 2035  .  white petrolatum (VASELINE) gel        0.2 application at 04/05/11 0034  . DISCONTD: 0.9 %  sodium chloride infusion  250 mL Intravenous PRN Max Fickle, MD 10 mL/hr at 03/29/11 0307 250 mL at 03/29/11 0307  . DISCONTD: 0.9 %  sodium chloride infusion   Intravenous  Continuous PRN Max Fickle, MD 10 mL/hr at 04/02/11 0039    . DISCONTD: antiseptic oral rinse (BIOTENE) solution 15 mL  15 mL Mouth Rinse QID Max Fickle, MD   15 mL at 04/03/11 1600  . DISCONTD: artificial tears (LACRILUBE) ophthalmic ointment 1 application  1 application Both Eyes Q8H Max Fickle, MD   1 application at 03/31/11 0631  . DISCONTD: ceFEPIme (MAXIPIME) 1 g in dextrose 5 % 50 mL IVPB  1 g Intravenous Q12H Raeford Razor, MD   1 g at 03/26/11 2047  . DISCONTD: cefTRIAXone (ROCEPHIN) 1 g in dextrose 5 % 50 mL IVPB  1 g Intravenous Q24H Eugene Garnet, PHARMD   1 g at 04/02/11 0031  . DISCONTD: chlorhexidine (PERIDEX) 0.12 % solution 15 mL  15 mL Mouth Rinse BID Max Fickle, MD   15 mL at 04/02/11 2030  . DISCONTD: cisatracurium (NIMBEX) 200 mg in sodium chloride 0.9 % 200 mL infusion  1-1.5 mcg/kg/min Intravenous Continuous Max Fickle, MD   1.5 mcg/kg/min at 03/28/11 0021  . DISCONTD: clonazePAM (KLONOPIN) tablet 0.5 mg  0.5 mg Oral QID Carin Hock Stewart   0.5 mg at 04/03/11 2127  . DISCONTD: clonazePAM (KLONOPIN) tablet 0.5 mg  0.5 mg Oral Q6H Charles R Stewart   0.5 mg at 04/06/11 1142  . DISCONTD: cloNIDine (CATAPRES - Dosed in mg/24 hr) patch 0.1 mg  0.1 mg Transdermal Weekly Billy Fischer, MD   0.1 mg at 04/05/11 1200  . DISCONTD: famotidine (PEPCID) 40 MG/5ML suspension 20 mg  20 mg Per Tube QHS Severiano Gilbert, PHARMD   20 mg at 04/01/11 2128  . DISCONTD: famotidine (PEPCID) IVPB 20 mg  20 mg Intravenous QHS Max Fickle, MD   20 mg at 03/30/11 2230  . DISCONTD: feeding supplement (NEPRO CARB STEADY) liquid 1,000 mL  1,000 mL Oral Continuous Ardyth Gal, MD 20 mL/hr at 03/31/11 2208 1,000 mL at 03/31/11 2208  . DISCONTD: feeding supplement (OSMOLITE 1.2 CAL) liquid 1,000 mL  1,000 mL Oral Continuous Billy Fischer, MD 20 mL/hr at 03/28/11 1628 1,000 mL at 03/28/11 1628  . DISCONTD: fentaNYL (SUBLIMAZE) 10 mcg/mL in sodium chloride 0.9 % 250 mL  infusion  120 mcg/hr Intravenous Continuous Colleen Can, PHARMD   120 mcg/hr at 03/27/11 1900  . DISCONTD: fentaNYL (SUBLIMAZE) 10 mcg/mL in sodium chloride 0.9 % 250 mL infusion  50-400 mcg/hr Intravenous Titrated Max Fickle, MD   25 mcg/hr at 04/02/11 1000  . DISCONTD: fentaNYL (SUBLIMAZE) bolus via infusion 25 mcg  25 mcg Intravenous Once Robynn Pane, MD      . DISCONTD: fentaNYL (SUBLIMAZE) bolus via infusion 50 mcg  50 mcg Intravenous STAT Robynn Pane, MD      . DISCONTD: fentaNYL (SUBLIMAZE) bolus via infusion 50-100 mcg  50-100 mcg Intravenous Q6H PRN Max Fickle, MD   100 mcg at 04/01/11 2031  . DISCONTD: fentaNYL (SUBLIMAZE) injection 3,000-6,000 mcg  50-100 mcg/kg Intravenous Continuous Max Fickle, MD 12 mL/hr at 03/26/11 2219 120 mcg at 03/26/11 2219  . DISCONTD: furosemide (LASIX) injection 40 mg  40 mg Intravenous Q6H Max Fickle, MD      . DISCONTD:  furosemide (LASIX) injection 40 mg  40 mg Intravenous Q6H Max Fickle, MD   40 mg at 04/02/11 0928  . DISCONTD: hydrALAZINE (APRESOLINE) 20 MG/ML injection           . DISCONTD: hydrALAZINE (APRESOLINE) injection 10 mg  10 mg Intravenous Q6H PRN Max Fickle, MD   10 mg at 04/04/11 0207  . DISCONTD: hydrALAZINE (APRESOLINE) injection 10 mg  10 mg Intravenous Q8H Max Fickle, MD   10 mg at 04/04/11 0541  . DISCONTD: hydrALAZINE (APRESOLINE) injection 10 mg  10 mg Intravenous Q6H Billy Fischer, MD   10 mg at 04/05/11 1202  . DISCONTD: hydrALAZINE (APRESOLINE) injection 10-20 mg  10-20 mg Intravenous Q4H PRN Billy Fischer, MD   10 mg at 03/27/11 0251  . DISCONTD: hydrALAZINE (APRESOLINE) injection 10-40 mg  10-40 mg Intravenous Q4H PRN Billy Fischer, MD   20 mg at 03/30/11 2102  . DISCONTD: hydrALAZINE (APRESOLINE) injection 20 mg  20 mg Intravenous Q6H Billy Fischer, MD   20 mg at 04/06/11 1142  . DISCONTD: insulin aspart (novoLOG) injection 0-15 Units  0-15 Units Subcutaneous Q4H Elizabeth  Deterding   2 Units at 03/31/11 0035  . DISCONTD: labetalol (NORMODYNE) tablet 200 mg  200 mg Per Tube BID Sadie Haber, MD   200 mg at 03/31/11 2224  . DISCONTD: labetalol (NORMODYNE) tablet 300 mg  300 mg Per Tube BID Max Fickle, MD   300 mg at 04/01/11 2128  . DISCONTD: labetalol (NORMODYNE) tablet 400 mg  400 mg Per Tube BID Max Fickle, MD   400 mg at 04/02/11 0926  . DISCONTD: labetalol (NORMODYNE,TRANDATE) 4 mg/mL in dextrose 5 % 125 mL infusion  1 mg/min Intravenous Titrated Billy Fischer, MD 300 mL/hr at 03/27/11 1330 20 mg/min at 03/27/11 1330  . DISCONTD: labetalol (NORMODYNE,TRANDATE) injection 20 mg  20 mg Intravenous Q10 min PRN Billy Fischer, MD   20 mg at 03/26/11 2354  . DISCONTD: labetalol (NORMODYNE,TRANDATE) injection 20 mg  20 mg Intravenous STAT Robynn Pane, MD      . DISCONTD: labetalol (NORMODYNE,TRANDATE) injection 20 mg  20 mg Intravenous Q2H PRN Max Fickle, MD   20 mg at 04/01/11 1730  . DISCONTD: LORazepam (ATIVAN) injection 2 mg  2 mg Intravenous Q2H PRN Tammy Parrett, NP   2 mg at 04/03/11 0859  . DISCONTD: LORazepam (ATIVAN) injection 2-4 mg  2-4 mg Intravenous Q4H PRN Max Fickle, MD   2 mg at 04/04/11 0450  . DISCONTD: metoprolol (LOPRESSOR) injection 5 mg  5 mg Intravenous Q6H Max Fickle, MD   5 mg at 04/04/11 0541  . DISCONTD: metoprolol (LOPRESSOR) tablet 50 mg  50 mg Oral BID Rakesh V. Vassie Loll, MD   50 mg at 04/06/11 0915  . DISCONTD: metoprolol tartrate (LOPRESSOR) tablet 25 mg  25 mg Oral BID Billy Fischer, MD   25 mg at 04/04/11 2154  . DISCONTD: metroNIDAZOLE (FLAGYL) IVPB 500 mg  500 mg Intravenous Q8H Raeford Razor, MD   500 mg at 03/26/11 2049  . DISCONTD: midazolam (VERSED) 1 mg/mL in sodium chloride 0.9 % 50 mL infusion  4 mg/hr Intravenous Continuous Billy Fischer, MD   4 mg/hr at 03/27/11 2236  . DISCONTD: midazolam (VERSED) bolus via infusion 1 mg  1 mg Intravenous Once Robynn Pane, MD      . DISCONTD: midazolam (VERSED)  bolus via infusion 1 mg  1 mg Intravenous Once Robynn Pane, MD      . DISCONTD: midazolam (  VERSED) injection 1-2 mg  1-2 mg Intravenous Q2H PRN Madhav Devani   2 mg at 03/30/11 1839  . DISCONTD: moxifloxacin (AVELOX) IVPB 400 mg  400 mg Intravenous Q24H Max Fickle, MD      . DISCONTD: moxifloxacin (AVELOX) IVPB 400 mg  400 mg Intravenous Q24H Eugene Garnet, PHARMD   400 mg at 03/27/11 2304  . DISCONTD: nitroGLYCERIN (NITROGLYN) 2 % ointment 1 inch  1 inch Topical Q6H PRN Max Fickle, MD   1 inch at 04/02/11 1705  . DISCONTD: nitroGLYCERIN (NITROGLYN) 2 % ointment 1 inch  1 inch Topical Q6H Max Fickle, MD   1 inch at 04/04/11 0542  . DISCONTD: norepinephrine (LEVOPHED) 4,000 mcg in dextrose 5 % 250 mL infusion  0.5-10 mcg/min Intravenous Titrated Billy Fischer, MD   2 mcg/min at 03/29/11 0530  . DISCONTD: oseltamivir (TAMIFLU) 6 MG/ML suspension 75 mg  75 mg Oral BID Eugene Garnet, PHARMD      . DISCONTD: oseltamivir (TAMIFLU) 6 MG/ML suspension 75 mg  75 mg Per Tube BID Max Fickle, MD   75 mg at 03/26/11 2330  . DISCONTD: oseltamivir (TAMIFLU) capsule 75 mg  75 mg Oral BID Max Fickle, MD      . DISCONTD: pantoprazole (PROTONIX) injection 40 mg  40 mg Intravenous Q12H Billy Fischer, MD   40 mg at 04/06/11 0915  . DISCONTD: phenytoin (DILANTIN) injection 100 mg  100 mg Intravenous Q8H Juliann Pulse, PHARMD      . DISCONTD: phenytoin (DILANTIN) injection 100 mg  100 mg Intravenous Q8H Juliann Pulse, PHARMD   100 mg at 03/31/11 9147  . DISCONTD: phenytoin (DILANTIN) injection 100 mg  100 mg Intravenous BID Severiano Gilbert, PHARMD      . DISCONTD: phenytoin (DILANTIN) injection 100 mg  100 mg Intravenous QHS Severiano Gilbert, PHARMD   100 mg at 04/02/11 2305  . DISCONTD: propofol (DIPRIVAN) 10 MG/ML infusion  5-70 mcg/kg/min Intravenous Titrated Tammy Parrett, NP   10 mcg/kg/min at 03/30/11 1854  . DISCONTD: sodium bicarbonate tablet 1,300 mg  1,300 mg Oral TID  Ardyth Gal, MD   1,300 mg at 04/02/11 1705  . DISCONTD: valproate (DEPACON) 500 mg in dextrose 5 % 50 mL IVPB  500 mg Intravenous Q12H Charles R Stewart   500 mg at 04/04/11 0541  . DISCONTD: valproate (DEPACON) 750 mg in dextrose 5 % 50 mL IVPB  750 mg Intravenous Q12H Charles R Stewart   750 mg at 04/04/11 2156  . DISCONTD: vancomycin (VANCOCIN) 750 mg in sodium chloride 0.9 % 150 mL IVPB  750 mg Intravenous Q24H Juliann Pulse, PHARMD      . DISCONTD: vancomycin (VANCOCIN) IVPB 1000 mg/200 mL premix  1,000 mg Intravenous Q24H Eugene Garnet, PHARMD   1,000 mg at 03/27/11 1925   No current outpatient prescriptions on file as of 04/07/2011.    Home: Home Living Lives With: Family Receives Help From: Family Type of Home: House Home Layout: One level Home Access: Stairs to enter Entrance Stairs-Rails: None Entrance Stairs-Number of Steps: 2 Bathroom Shower/Tub: Associate Professor: Yes How Accessible: Accessible via wheelchair Home Adaptive Equipment: None  Functional History: Prior Function Level of Independence: Independent with basic ADLs;Independent with gait;Independent with transfers Driving: Yes Vocation: Student Functional Status:  Mobility: Bed Mobility Bed Mobility: Yes Rolling Right: 3: Mod assist;With rail;Patient percentage (comment) Rolling Right Details (indicate cue type and reason): patient able to reach across body to  grab onto rail to help with roll, pt has myoclonus throughout entire motion; pt = 60%  Rolling Left: 3: Mod assist;With rail;Patient percentage (comment) Rolling Left Details (indicate cue type and reason): very similar to R rolling, pt = 60% Supine to Sit: Patient percentage (comment);1: +2 Total assist Supine to Sit Details (indicate cue type and reason): pt does not have much trunk musculature to help with sitting up, pt = 50% Transfers Transfers: Yes Sit to Stand: Patient percentage  (comment);1: +2 Total assist Sit to Stand Details (indicate cue type and reason): pt = 20%, having myoclonus in legs and body when starting to stand  Stand to Sit: 1: +2 Total assist Stand to Sit Details: pt = 20%, having the same myoclonus in legs and body during transfer  Squat Pivot Transfers: 1: +2 Total assist;Patient percentage (comment) Squat Pivot Transfer Details (indicate cue type and reason): pt = 20% with transfer, has myoclonus during movement.  Ambulation/Gait Ambulation/Gait: No Stairs: No Wheelchair Mobility Wheelchair Mobility: No  ADL:    Cognition: Cognition Arousal/Alertness: Awake/alert Orientation Level: Oriented to person;Oriented to place Cognition Arousal/Alertness: Awake/alert Overall Cognitive Status: Appears within functional limits for tasks assessed Orientation Level: Oriented to person;Oriented to place  Blood pressure 160/88, pulse 85, temperature 98.4 F (36.9 C), temperature source Oral, resp. rate 25, height 5\' 2"  (1.575 m), weight 66.3 kg (146 lb 2.6 oz), last menstrual period 03/02/2011, SpO2 96.00%. Physical Exam  Constitutional: She appears well-developed.  HENT:  Head: Normocephalic.  Neck: Normal range of motion. Neck supple. No thyromegaly present.  Cardiovascular: Normal rate and regular rhythm.   Pulmonary/Chest: Effort normal. She has no wheezes.  Abdominal: Soft. She exhibits no distension. There is no tenderness.  Lymphadenopathy:       2+ pretibial edema  Neurological: She is alert.       Oriented to person place date of birth. Cannot recall the date even with visual cues. Very slow in processing simple commands. Limited awareness and insight. Affect very flat.  No gross CN deficits. Myoclonic jerks noted right greater than left. DTR's 3+ right greater than left.  Multi beat clonus bilateral LE's.  Sensory 1/2 in legs below knees. UES grossly 2+ to 3/5 proximally to 4/5 distally.  LES 2/5 proximal to 3/5 distally.    Psychiatric:         Affect is flat    Results for orders placed during the hospital encounter of 03/26/11 (from the past 24 hour(s))  VALPROIC ACID LEVEL     Status: Abnormal   Collection Time   04/07/11  5:00 AM      Component Value Range   Valproic Acid Lvl 42.6 (*) 50.0 - 100.0 (ug/mL)  BASIC METABOLIC PANEL     Status: Abnormal   Collection Time   04/07/11  5:00 AM      Component Value Range   Sodium 139  135 - 145 (mEq/L)   Potassium 3.6  3.5 - 5.1 (mEq/L)   Chloride 106  96 - 112 (mEq/L)   CO2 21  19 - 32 (mEq/L)   Glucose, Bld 89  70 - 99 (mg/dL)   BUN 85 (*) 6 - 23 (mg/dL)   Creatinine, Ser 2.95 (*) 0.50 - 1.10 (mg/dL)   Calcium 7.9 (*) 8.4 - 10.5 (mg/dL)   GFR calc non Af Amer 26 (*) >90 (mL/min)   GFR calc Af Amer 30 (*) >90 (mL/min)  MAGNESIUM     Status: Normal   Collection Time  04/07/11  5:00 AM      Component Value Range   Magnesium 2.5  1.5 - 2.5 (mg/dL)  PHOSPHORUS     Status: Abnormal   Collection Time   04/07/11  5:00 AM      Component Value Range   Phosphorus 6.6 (*) 2.3 - 4.6 (mg/dL)  CBC     Status: Abnormal   Collection Time   04/07/11  5:00 AM      Component Value Range   WBC 18.6 (*) 4.0 - 10.5 (K/uL)   RBC 3.66 (*) 3.87 - 5.11 (MIL/uL)   Hemoglobin 10.7 (*) 12.0 - 15.0 (g/dL)   HCT 40.9 (*) 81.1 - 46.0 (%)   MCV 88.8  78.0 - 100.0 (fL)   MCH 29.2  26.0 - 34.0 (pg)   MCHC 32.9  30.0 - 36.0 (g/dL)   RDW 91.4  78.2 - 95.6 (%)   Platelets 248  150 - 400 (K/uL)  TSH     Status: Normal   Collection Time   04/07/11  5:00 AM      Component Value Range   TSH 3.003  0.350 - 4.500 (uIU/mL)  GLUCOSE, CAPILLARY     Status: Normal   Collection Time   04/07/11  7:37 AM      Component Value Range   Glucose-Capillary 86  70 - 99 (mg/dL)   Dg Chest Port 1 View  04/07/2011  *RADIOLOGY REPORT*  Clinical Data: Left lower lobe consolidation.  PORTABLE CHEST - 1 VIEW  Comparison: 04/04/2011  Findings: Left PICC appears in good position.  There is slight improvement in the  consolidation at the left lung base.  The right lung is clear.  Persistent cardiomegaly.  Vascularity is normal.  IMPRESSION: Slight improvement in the consolidation in the left lower lobe.  Original Report Authenticated By: Gwynn Burly, M.D.    Assessment/Plan: 1. Diagnosis: Anoxic brain injury, deconditioning 2. Does the need for close, 24 hr/day medical supervision in concert with the patient's rehab needs make it unreasonable for this patient to be served in a less intensive setting? Yes 3. Co-Morbidities requiring supervision/potential complications: AKI, Heart failure, pneumonia, HTN 4. Due to bladder management, bowel management, safety, skin/wound care, medication administration, pain management and patient education, does the patient require 24 hr/day rehab nursing? Yes 5. Does the patient require coordinated care of a physician, rehab nurse, PT (1-2 hrs/day, 5 days/week), OT (1-2 hrs/day, 5 days/week) and SLP (1-2 hrs/day, 5 days/week) to address physical and functional deficits in the context of the above medical diagnosis(es)? Yes Addressing deficits in the following areas: balance, bathing, bowel/bladder control, cognition, dressing, endurance, feeding, grooming, locomotion, psychosocial adjustment, speech, strength, swallowing, toileting and transferring 6. Can the patient actively participate in an intensive therapy program of at least 3 hrs of therapy per day at least 5 days per week? Yes and Potentially 7. The potential for patient to make measurable gains while on inpatient rehab is excellent 8. Anticipated functional outcomes upon discharge from inpatients are supervision to minimal assist PT, supervision to minimal assist OT, supervision to minimal assist SLP 9. Estimated rehab length of stay to reach the above functional goals is: 3 weeks 10. Does the patient have adequate social supports to accommodate these discharge functional goals? Yes 11. Anticipated D/C setting:  Home 12. Anticipated post D/C treatments: HH therapy 13. Overall Rehab/Functional Prognosis: excellent  RECOMMENDATIONS: This patient's condition is appropriate for continued rehabilitative care in the following setting: CIR Patient has agreed to participate  in recommended program. Yes Note that insurance prior authorization may be required for reimbursement for recommended care.  Comment: Will follow along for medical stability and for increased activity tolerance.   Ivory Broad, MD 04/07/2011

## 2011-04-07 NOTE — Progress Notes (Signed)
CSW provided requested documentation to pt mother, at bedside. CSW provided support. CSW will continue to follow.  Baxter Flattery, MSW [phone]

## 2011-04-07 NOTE — Progress Notes (Signed)
   CARE MANAGEMENT NOTE 04/07/2011  Patient:  Heather Sims, Heather Sims   Account Number:  [id number]  Date Initiated:  03/27/2011  Documentation initiated by:  [facility]  Subjective/Objective Assessment:   Resp failure - post CPR.     Action/Plan:   PTA, PT INDEPENDENT OF ADLS.   Anticipated DC Date:  04/10/2011   Anticipated DC Plan:  HOME W HOME HEALTH SERVICES      DC Planning Services  CM consult      Choice offered to / List presented to:             Status of service:  In process, will continue to follow Medicare Important Message given?   (If response is "NO", the following Medicare IM given date fields will be blank) Date Medicare IM given:   Date Additional Medicare IM given:    Discharge Disposition:    Per UR Regulation:  Reviewed for med. necessity/level of care/duration of stay  Comments:  04/06/10 Heather Guess,RN,[name] 1156 PT EXTUBATED ON 1/10; PHYSICAL THERAPIST RECOMMENDING OT CONSULT AND REHAB CONSULT.  SPOKE WITH BEDSIDE NURSE WHO STATES SHE WILL OBTAIN ORDERS FOR THESE CONSULTS FROM MD. WILL FOLLOW. Phone #[phone]   04-06-11 2:30pm Heather Sims, RNBSN - [phone] UR completed.  04-03-11 1:54pm Heather Sims, RNBSN [phone] UR completed. now extubated - but seizures - neuro consulting.   03-30-11 2:20pm Heather Sims - [phone] UR completed.  03-27-11 7:35am Heather Sims, RNBSWSN [phone] UR Completed.

## 2011-04-08 ENCOUNTER — Inpatient Hospital Stay (HOSPITAL_COMMUNITY): Payer: Medicaid Other

## 2011-04-08 LAB — PROTEIN / CREATININE RATIO, URINE
Creatinine, Urine: 77.94 mg/dL
Protein Creatinine Ratio: 1.05 — ABNORMAL HIGH (ref 0.00–0.15)
Total Protein, Urine: 82.2 mg/dL

## 2011-04-08 LAB — BASIC METABOLIC PANEL
BUN: 83 mg/dL — ABNORMAL HIGH (ref 6–23)
CO2: 21 mEq/L (ref 19–32)
Chloride: 106 mEq/L (ref 96–112)
Creatinine, Ser: 2.31 mg/dL — ABNORMAL HIGH (ref 0.50–1.10)
Potassium: 3.2 mEq/L — ABNORMAL LOW (ref 3.5–5.1)

## 2011-04-08 LAB — CBC
HCT: 31.7 % — ABNORMAL LOW (ref 36.0–46.0)
Hemoglobin: 10.5 g/dL — ABNORMAL LOW (ref 12.0–15.0)
MCHC: 33.1 g/dL (ref 30.0–36.0)
MCV: 89.3 fL (ref 78.0–100.0)
RDW: 14.1 % (ref 11.5–15.5)

## 2011-04-08 MED ORDER — FAMOTIDINE 20 MG PO TABS
20.0000 mg | ORAL_TABLET | Freq: Every day | ORAL | Status: DC
  Administered 2011-04-09 – 2011-04-13 (×5): 20 mg via ORAL
  Filled 2011-04-08 (×5): qty 1

## 2011-04-08 MED ORDER — POTASSIUM CHLORIDE 10 MEQ/100ML IV SOLN
10.0000 meq | INTRAVENOUS | Status: AC
  Administered 2011-04-08 (×2): 10 meq via INTRAVENOUS
  Filled 2011-04-08 (×2): qty 100

## 2011-04-08 MED ORDER — CLONIDINE HCL 0.3 MG/24HR TD PTWK
0.3000 mg | MEDICATED_PATCH | TRANSDERMAL | Status: DC
  Administered 2011-04-08: 0.3 mg via TRANSDERMAL
  Filled 2011-04-08: qty 1

## 2011-04-08 MED ORDER — AMLODIPINE BESYLATE 2.5 MG PO TABS
2.5000 mg | ORAL_TABLET | Freq: Every day | ORAL | Status: DC
  Administered 2011-04-08 – 2011-04-13 (×6): 2.5 mg via ORAL
  Filled 2011-04-08 (×6): qty 1

## 2011-04-08 MED ORDER — CLONAZEPAM 0.5 MG PO TABS
0.5000 mg | ORAL_TABLET | Freq: Every day | ORAL | Status: DC
  Administered 2011-04-08 – 2011-04-13 (×6): 0.5 mg via ORAL
  Filled 2011-04-08 (×6): qty 1

## 2011-04-08 MED ORDER — POTASSIUM CHLORIDE 20 MEQ/15ML (10%) PO LIQD
40.0000 meq | Freq: Once | ORAL | Status: AC
  Administered 2011-04-08: 40 meq via ORAL
  Filled 2011-04-08 (×2): qty 30

## 2011-04-08 NOTE — Progress Notes (Signed)
04/08/2011  [place]way Bing, PT [phone] (pager)

## 2011-04-08 NOTE — Progress Notes (Signed)
[facility] PROGRESS NOTE    SUBJECTIVE   Continues to have lower extremity myoclonus when she tries to move her lower extremities.  She states that when she concentrates on something (such as TV) the myoclonus will stop.    OBJECTIVE   Vital signs in last 24 hours: Temp:  [97.9 F (36.6 C)-98.6 F (37 C)] 98.6 F (37 C) (01/16 0500) Pulse Rate:  [86-97] 86  (01/16 0500) Resp:  [18-19] 19  (01/16 0500) BP: (152-198)/(87-127) 163/87 mmHg (01/16 0500) SpO2:  [93 %-96 %] 93 % (01/16 0500)  Intake/Output from previous day: 01/15 0701 - 01/16 0700 In: 660 [P.O.:480; I.V.:120; IV Piggyback:60] Out: 1052 [Urine:1050; Stool:2] Intake/Output this shift:   Nutritional status: Dysphagia  Past Medical History  Diagnosis Date  . Pneumonia last 2 weeks    'walking pneumonia'    Neurologic Exam:  Mental Status: Alert, oriented, thought content appropriate.  Speech fluent without evidence of aphasia. Able to follow 3 step commands without difficulty. Cranial Nerves: II-Visual fields grossly intact. III/IV/VI-Extraocular movements intact.  Pupils reactive bilaterally. V/VII-Smile symmetric VIII-grossly intact IX/X-normal gag XI-bilateral shoulder shrug XII-midline tongue extension Motor: 5/5 bilaterally with normal tone and bulk upper extremities.  She has increased tone in the lower extremities, able to wiggle her toes but when she tries to hip flex or flex her knees she will start to show trunk, bilateral myoclonic jerks.  Sensory: Pinprick and light touch intact throughout, bilaterally Deep Tendon Reflexes: 2+ and symmetric throughout with minimal achilles reflex Plantars: mute bilaterally  Cerebellar: Normal finger-to-nose,   Lab Results: Lab Results  Component Value Date/Time   CHOL 296* 03/26/2011 11:00 PM   Lipid Panel No results found for this basename: CHOL,TRIG,HDL,CHOLHDL,VLDL,LDLCALC in the last 72  hours  Studies/Results: Dg Chest Port 1 View  04/07/2011  *RADIOLOGY REPORT*  Clinical Data: Left lower lobe consolidation.  PORTABLE CHEST - 1 VIEW  Comparison: 04/04/2011  Findings: Left PICC appears in good position.  There is slight improvement in the consolidation at the left lung base.  The right lung is clear.  Persistent cardiomegaly.  Vascularity is normal.  IMPRESSION: Slight improvement in the consolidation in the left lower lobe.  Original Report Authenticated By: Gwynn Burly, M.D.    Medications:     Scheduled:   . antiseptic oral rinse  15 mL Mouth Rinse BID  . cloNIDine  0.3 mg Transdermal Weekly  . furosemide  120 mg Oral BID  . heparin  5,000 Units Subcutaneous Q8H  . hydrALAZINE  25 mg Oral QID  . metoprolol tartrate  100 mg Oral BID  . pantoprazole  40 mg Oral Q1200  . sodium chloride  10 mL Intracatheter Q12H  . sodium chloride  3 mL Intravenous Q12H  . 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 Generalized seizures, controlled with Depacon 1000 mg every 12 hours. 3. Continues to have myoclonic jerking when attempts to move LE or stimulated.  May consider low dose clonazepam starting at 0.5 mg daily with slow increase to total of 1.5-3 mg divided TID    Onalee Hua  Ula Lingo [facility] [phone]  04/08/2011, 8:51 AM

## 2011-04-08 NOTE — Progress Notes (Signed)
OT Cancellation Note  Treatment cancelled today due to:  Pt just finishing with a PT session and fatigued. OT to evaluate in 04-09-11 in AM.   Lucile Shutters   OTR/L Pager: [phone] Office: [phone] .

## 2011-04-08 NOTE — Progress Notes (Signed)
Rehab admissions - Evaluated for possible admission.  I spoke with patient and mom.  Mom would like inpatient rehab and then plans to take patient home with her.  Once MD feels patient is medically stable, I can consider her for inpatient rehab admission.  Please call me for questions.  Pager [phone]

## 2011-04-08 NOTE — Progress Notes (Signed)
Subjective: Patient with left side abdominal pain, has some diarrhea. C diff negative. Mon feels myoclonus worse.  Objective: Filed Vitals:   04/07/11 1826 04/07/11 2100 04/08/11 0500 04/08/11 1336  BP: 178/110 160/98 163/87 179/108  Pulse:  87 86 91  Temp:  97.9 F (36.6 C) 98.6 F (37 C) 98.2 F (36.8 C)  TempSrc:  Oral Oral Oral  Resp:  18 19 20   Height:      Weight:      SpO2:  94% 93% 95%   Weight change:   Intake/Output Summary (Last 24 hours) at 04/08/11 1458 Last data filed at 04/08/11 0500  Gross per 24 hour  Intake      0 ml  Output    802 ml  Net   -802 ml    General: Alert, awake, oriented x3, in no acute distress.  HEENT: No bruits, no goiter.  Heart: Regular rate and rhythm, without murmurs, rubs, gallops.  Lungs: Crackles left side, bilateral air movement.  Abdomen: Soft, mild left lower quadrant pain, nondistended, positive bowel sounds.  Neuro:myoclonus.  Extremities: trace edema.   Lab Results:  Basename 04/08/11 0550 04/07/11 0500 04/06/11 0530  NA 139 139 --  K 3.2* 3.6 --  CL 106 106 --  CO2 21 21 --  GLUCOSE 91 89 --  BUN 83* 85* --  CREATININE 2.31* 2.54* --  CALCIUM 7.9* 7.9* --  MG -- 2.5 2.4  PHOS -- 6.6* 5.9*    Basename 04/06/11 0530  AST 30  ALT 14  ALKPHOS 63  BILITOT 0.2*  PROT 5.2*  ALBUMIN 1.7*   No results found for this basename: LIPASE:2,AMYLASE:2 in the last 72 hours  Basename 04/08/11 0550 04/07/11 0500  WBC 14.7* 18.6*  NEUTROABS -- --  HGB 10.5* 10.7*  HCT 31.7* 32.5*  MCV 89.3 88.8  PLT 279 248    Basename 04/07/11 0500  TSH 3.003  T4TOTAL --  T3FREE --  THYROIDAB --   Micro Results: Recent Results (from the past 240 hour(s))  URINE CULTURE     Status: Normal   Collection Time   04/02/11 11:14 AM      Component Value Range Status Comment   Specimen Description URINE, CATHETERIZED   Final    Special Requests NONE   Final    Setup Time [id number]   Final    Colony Count NO GROWTH   Final    Culture NO GROWTH   Final    Report Status 04/03/2011 FINAL   Final   CULTURE, BLOOD (ROUTINE X 2)     Status: Normal   Collection Time   04/02/11 12:00 PM      Component Value Range Status Comment   Specimen Description BLOOD RIGHT ANTECUBITAL   Final    Special Requests BOTTLES DRAWN AEROBIC AND ANAEROBIC 10CC   Final    Setup Time [id number]   Final    Culture NO GROWTH 5 DAYS   Final    Report Status 04/08/2011 FINAL   Final   CULTURE, BLOOD (ROUTINE X 2)     Status: Normal   Collection Time   04/02/11 12:15 PM      Component Value Range Status Comment   Specimen Description BLOOD RIGHT HAND   Final    Special Requests BOTTLES DRAWN AEROBIC ONLY 10CC   Final    Setup Time [id number]   Final    Culture NO GROWTH 5 DAYS   Final    Report Status 04/08/2011 FINAL  Final   URINE CULTURE     Status: Normal   Collection Time   04/06/11  5:30 AM      Component Value Range Status Comment   Specimen Description URINE, CATHETERIZED   Final    Special Requests NONE   Final    Setup Time [id number]   Final    Colony Count 80,000 COLONIES/ML   Final    Culture YEAST   Final    Report Status 04/07/2011 FINAL   Final   CLOSTRIDIUM DIFFICILE BY PCR     Status: Normal   Collection Time   04/06/11  5:40 AM      Component Value Range Status Comment   C difficile by pcr NEGATIVE  NEGATIVE  Final   CULTURE, BLOOD (ROUTINE X 2)     Status: Normal (Preliminary result)   Collection Time   04/06/11  9:47 AM      Component Value Range Status Comment   Specimen Description BLOOD RIGHT ANTECUBITAL   Final    Special Requests BOTTLES DRAWN AEROBIC AND ANAEROBIC 10CC   Final    Setup Time [id number]   Final    Culture     Final    Value:        BLOOD CULTURE RECEIVED NO GROWTH TO DATE CULTURE WILL BE HELD FOR 5 DAYS BEFORE ISSUING A FINAL NEGATIVE REPORT   Report Status PENDING   Incomplete   CULTURE, BLOOD (ROUTINE X 2)     Status: Normal (Preliminary result)   Collection Time   04/06/11  10:00 AM      Component Value Range Status Comment   Specimen Description BLOOD RIGHT HAND   Final    Special Requests BOTTLES DRAWN AEROBIC AND ANAEROBIC 10CC   Final    Setup Time [id number]   Final    Culture     Final    Value:        BLOOD CULTURE RECEIVED NO GROWTH TO DATE CULTURE WILL BE HELD FOR 5 DAYS BEFORE ISSUING A FINAL NEGATIVE REPORT   Report Status PENDING   Incomplete     Studies/Results: Dg Chest Port 1 View  04/07/2011  *RADIOLOGY REPORT*  Clinical Data: Left lower lobe consolidation.  PORTABLE CHEST - 1 VIEW  Comparison: 04/04/2011  Findings: Left PICC appears in good position.  There is slight improvement in the consolidation at the left lung base.  The right lung is clear.  Persistent cardiomegaly.  Vascularity is normal.  IMPRESSION: Slight improvement in the consolidation in the left lower lobe.  Original Report Authenticated By: Gwynn Burly, M.D.    Medications: I have reviewed the patient's current medications.  21 year old female with cardiac arrest due to respiratory arrest, who has significant cardiomyopathy and HTN, with acute renal failure, now extubated 1/10, and recovering renal function (unknown baseline creatinine). Illness preceded by edema and leg/abd swelling for a month or 2 PTA)  1. VDRF - post arrest, resolved.  2. Cardiac - Cardiomyopathy with reduced systolic function, EF 40-45% (Echo 03/27/2011). Suspect HTN-cardiomyopathy. Was flu negative, ? 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 - Renal artery stenosis U/S negative on 1/14   3. Renal: AKI, Continue with lasix. Renal following. I will follow renal recommendation for biopsy. Depending on renal recommendation regarding biopsy will consider rehab.   4. ID - : WBC trending down.  Received treatment for PNA.  Will follow urine culture. Antibiotics:  03/26/11 vanc >>1/5  03/26/11  ceftriaxone >> 04/02/11  03/26/11 moxi >>1/5  03/26/11 tamiflu >>03/27/11    5. Neuro -  seizures, myoclonus. Also with severe B LE weakness.  - neuro following. Anticonvulsants adjusted (Depacon 1000 mg q12 h)  -spasticity improved but remains very weak  I will stop phenergan. I will start Clonazepam per neuro recommendation.   6. Heme Anemia (03/26/2011). -Continue to monitor. No indication for PRBCs presently 7. Diet:  Dysphagia diet started 1/13 8-Hypertension:  Continue with hydralazine, clonidine, metoprolol. I will add low dose Norvasc.  Hypokalemia: vomit Po potassium. I will replete IV. 2 runs only due to renal function. Abdominal pain, diarrhea: c diff negative. I will check KUB. Will consider Ct.     LOS: 13 days   Cormac Wint M.D.  [facility] 04/08/2011, 2:58 PM

## 2011-04-08 NOTE — Progress Notes (Signed)
Pt had 2:00 medications (Potassium and hydralazine). Pt vomitted 300cc immediately after receiving medications. MD paged. Pt states that she "feels better now". Zofran 4mg  given. Will continue to monitor.

## 2011-04-08 NOTE — Progress Notes (Addendum)
Physical Therapy Treatment Patient Details Name: Lesia Monica MRN: [id number] DOB: [birth year] Today's Date: 04/08/2011  PT Assessment/Plan  PT - Assessment/Plan Comments on Treatment Session: 21 y/o female with no past medical history presented to the [facility] ED after respiratory arrest. Pt was able to assist more so with supine to sit transfers then with sit to stand. The more complex the task the more the myoclonus is apparent. Able to help stand but as soon as is off bed myoclonus starts up more and is unable to stand. Pt is cognitively aware of everything PT wants to do but is limited by myoclonus during activiities.  Pt agreed to sit in chair tomorrow.  PT Plan: Discharge plan remains appropriate PT Frequency: Min 3X/week Follow Up Recommendations: Inpatient Rehab Equipment Recommended: Defer to next venue PT Goals  Acute Rehab PT Goals PT Goal Formulation: With patient/family Time For Goal Achievement: 2 weeks PT Goal: Rolling Supine to Right Side - Progress: Progressing toward goal PT Goal: Rolling Supine to Left Side - Progress: Progressing toward goal PT Goal: Supine/Side to Sit - Progress: Progressing toward goal PT Goal: Sit at Edge Of Bed - Progress: Progressing toward goal PT Goal: Sit to Supine/Side - Progress: Progressing toward goal PT Goal: Sit to Stand - Progress: Progressing toward goal PT Transfer Goal: Bed to Chair/Chair to Bed - Progress: Other (comment)  PT Treatment Precautions/Restrictions  Precautions Precautions: Fall Restrictions Weight Bearing Restrictions: No Mobility (including Balance) Bed Mobility Bed Mobility: Yes Rolling Right: 3: Mod assist Rolling Right Details (indicate cue type and reason): pt able to reach across body to grab onto rail to help with roll, pt has myoclonus throughout entire motion; pt = 70% Rolling Left: 3: Mod assist Rolling Left Details (indicate cue type and reason): pt = 70% Supine to Sit: 1: +2 Total assist Supine to Sit  Details (indicate cue type and reason): pt = 50%, pt does'nt have much trunk musculaturecontrol and again has that same myoclonus  Sitting - Scoot to Edge of Bed: 3: Mod assist Sitting - Scoot to Edge of Bed Details (indicate cue type and reason): pt = 50%  Transfers Transfers: Yes Sit to Stand: 1: +2 Total assist Sit to Stand Details (indicate cue type and reason): pt = 20%; was able to help with initial stand but then myoclonus takes over and pt is unable to continue the progression to standing  up Stand to Sit: 1: +2 Total assist Stand to Sit Details: pt = 20% Ambulation/Gait Ambulation/Gait: No Stairs: No Wheelchair Mobility Wheelchair Mobility: No  Posture/Postural Control Posture/Postural Control: No significant limitations Balance Balance Assessed: Yes Static Sitting Balance Static Sitting - Balance Support: Bilateral upper extremity supported (stable unless myoclonus kicks in then has limited control ) Static Sitting - Level of Assistance: 3: Mod assist Exercise  General Exercises - Lower Extremity Heel Slides: AAROM;Both;5 reps;Supine (myoclonus present more so in R LE then L) Toe Raises: AAROM;Both;5 reps;Supine Heel Raises: AAROM;Both;5 reps;Supine End of Session PT - End of Session Activity Tolerance: Patient limited by fatigue Patient left: in bed;with call bell in reach General Behavior During Session: [facility] for tasks performed Cognition: [facility] for tasks performed  Elvera Bicker 04/08/2011, 4:28 PM

## 2011-04-08 NOTE — Progress Notes (Signed)
Renal Daily Progress Note  Subjective: Patient in bed but awake.  Denies pain.  Appetite ok. Objective:  Vital signs in last 24 hours:  Temp:  [97.9 F (36.6 C)-98.6 F (37 C)] 98.6 F (37 C) (01/16 0500) Pulse Rate:  [86-97] 86  (01/16 0500) Resp:  [18-25] 19  (01/16 0500) BP: (152-198)/(87-127) 163/87 mmHg (01/16 0500) SpO2:  [93 %-96 %] 93 % (01/16 0500) Weight change:   Intake/Output: I/O last 3 completed shifts: In: 1120 [P.O.:660; I.V.:340; IV Piggyback:120] Out: 1517 [Urine:1515; Stool:2]  EXAM: Slow to speak but very appropriate, oriented to person and place.  CVS: RRR, 2/6 systolic murmur RS- Grossly clear lungs ABD: +BS, soft,  non-tender to palpation EXT: trace edema Neuro: awake and alert, still some myoclonus in legs and feet.   Lab Results:  Basename 04/08/11 0550 04/07/11 0500 04/06/11 0530  WBC 14.7* 18.6* 18.2*  HGB 10.5* 10.7* 11.6*  HCT 31.7* 32.5* 35.0*  PLT 279 248 274   BMET  Basename 04/08/11 0550 04/07/11 0500 04/06/11 0530  NA 139 139 141  K 3.2* 3.6 3.9  CL 106 106 105  CO2 21 21 23   GLUCOSE 91 89 91  BUN 83* 85* 83*  CREATININE 2.31* 2.54* 2.57*  CALCIUM 7.9* 7.9* 8.4  PHOS -- 6.6* 5.9*   LFT  Basename 04/06/11 0530  PROT 5.2*  ALBUMIN 1.7*  AST 30  ALT 14  ALKPHOS 63  BILITOT 0.2*  BILIDIR --  IBILI --   PTH: Lab Results  Component Value Date   PTH 250.2* 03/28/2011   CALCIUM 7.9* 04/08/2011   CAION 1.09* 03/26/2011   PHOS 6.6* 04/07/2011  Renal and Serologic Work up: (negative to date except for proteinuria)  HIV Neg  ANA Neg, DSDNA ab 22 (WNL) ASO 87 (WNL)  Diract Antiglobulin complement and IgG Neg  Total complement high, C3, C4 Normal  PTH 250.2 (H)  Kappa Light Chains 8.6 (H) Lamda Light Chains 5.8(H), Ratio 1.46 (WNL)  ANCA Negative  Urine ZOX:WRUEA 870 mg/gm creat 04/04/11; last UA >300 pro, 7-10 RBC (vs TNTC RBC on initial UA)  SPEP:  Total Protien 3.5 (L)  Albumin 45.1 (L)  Alpha 1 globulin 10.8 (H)    Alpha 2 globulin 19.2 (H)  Beta Globulin 5.8  Beta 2 5.1  Gamma Globulin 14.0  M Spike not detected  UPEP:  Tpro 33.4  Albumin Detected  Alpha 1 Detected  Alpha 2 Detected  Beta Detected  Gamma globulin Detected  Cryoglobulin REPORT Comments: (NOTE) Cryoglobulin, QL Analysis None Detected  Pending:  Glomerular Basement membrane   Studies/Results: Renal Artery Doppler (prelim): No significant RAS, bilateral hyperechoic appearance of kidneys.    Assessment/Plan: 21 year old female with cardiac arrest due to respiratory arrest, who has significant cardiomyopathy and HTN, with presumed acute renal failure, now extubated, and recovering renal function (unknown baseline creatinine; 2.31 on admission; nadir 1.95; peak 4.86) Illness preceded by edema and leg/abd swelling for a month or 2 PTA)  1) Renal Failure- Renal function mildly improved from yesterday, with 1050 cc's UOP.  May be reaching a baseline. Patient's renal failure and heart failure/plm edema and respiratory arrest may have all been due to malignant HTN, but primary renal disease has not been ruled out and remain suspicious of same. As blood pressure is controlled for a period of time, proteinuria may resolve. There was concern for systemic disease, autoimmune top of the list, but nothing in serologic workup so far to point to a specific  etiology. U/A from 1/4 still shows proteinuria and microhematuria. Although She has proteinuria, she is not nephrotic at least by ONG:EXBMW ratio. Can consider renal biopsy when stable if persistent proteinuria, evidence of nephrotic proteinuria or active sediment, or failure to return to normal renal function in setting of controlled HTN. Not quite stable enough at this time to pursue. Now on po lasix for her edema, foley back in, will continue to monitor strict in's and out's and daily labs  2) ID- UA sent and + for protein and leuks.  Sent for culture, will monitor. Blood cultures obtained too.    3) Abdominal pain- pt is still having diarrhea, c- diff is negative and pain improving.   4) Pulm - ? PNA vs non cardiogenic pulmonary edema- CXR with some worsening left base atelectasis; pulm edema has resolved; WBC is increasing; ? Aspiration - per primary service   5) Neuro- Now on Klonopin and Depakote for seizures/myoclonus; neuro following   6) Heme- hemoglobinstable since transfusion on 03/29/11. Ordered Coomb's and hemolysis workup. No thrombocytopenia.   7) CV- CCM is adjusting BP meds  8) Disposition- PT recs in patient rehab prior to going home, seems very appropriate in this patient.   MATTHEWS,CODY 04/08/2011 7:53 AM  I have seen and examined this patient and agree with the assessment/plan as outlined above by Ashley Royalty MD (PGY2). Renal function somewhat better with non-oliguric urine output. No acute electrolyte concerns and will replete potassium. If renal functions continues to improve- will get renal biopsy as out-patient/electively Mannat Benedetti K.,MD 04/08/2011 10:43 AM

## 2011-04-08 NOTE — PMR Pre-admission (Signed)
PMR Admission Coordinator Pre-Admission Assessment  Patient:  Heather Sims is an 21 y.o., female MRN:  [id number] DOB:  10-28-1990 Height:  5\' 2"  (157.5 cm) Weight:  69.536 kg (153 lb 4.8 oz)  Insurance:  Self pay  Current Medical History:   Patient Admitting Diagnosis: Anoxic brain injury, deconditioning   History of Present Illness:Admitted 01/03 with sudden onset SOB and cardiorespiratory failure with CPR in field.  She was pulseless for approximately 5 minutes and intubated in the ED.  Underwent hypothermia protocol and suffered seizures upon rewarming. Echo with  ejection fraction 40% and systolic function moderately reduced.  TEE with difuse hypokinesis ejection fraction 45%.  EEG with minimal seizure activity.  Hemoglobin 8.1 and creatine 2.31 on admission.  Transfused with 3 units PRBC. Was recently diagnosed with HTN.   Nephrology consulted and felt renal insufficiency due to HTN.  On valproate for seizure disorder.  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  Renal biopsy done 01/18 with resultant hematoma.  Renal biopsy is pending.  Had a run of vtach this am and was asymptomatic.  Received call from Dr. Sunnie Nielsen that patient is stable for admit to inpatient rehab.      Patients Past Medical History:   Past Medical History  Diagnosis Date  . Pneumonia last 2 weeks    'walking pneumonia'   Family Medical History:  family history is not on file. Patients Current Diet: General  Prior Rehab/Hospitalizations: No rehab admissions  Current Medications: Current facility-administered medications:0.9 %  sodium chloride infusion, , Intravenous, Continuous, Max Fickle, MD, Last Rate: 20 mL/hr at 04/13/11 0301;  amLODipine (NORVASC) tablet 2.5 mg, 2.5 mg, Oral, Daily, Belkys Regalado, MD, 2.5 mg at 04/13/11 0937;  antiseptic oral rinse (BIOTENE) solution 15 mL, 15 mL, Mouth Rinse, BID, Max Fickle, MD, 15 mL at 04/13/11 0943 clonazePAM (KLONOPIN) tablet 0.5 mg, 0.5 mg,  Oral, Daily, Belkys Regalado, MD, 0.5 mg at 04/13/11 0936;  cloNIDine (CATAPRES - Dosed in mg/24 hr) patch 0.3 mg, 0.3 mg, Transdermal, Weekly, Christian M Buettner, PHARMD, 0.3 mg at 04/08/11 1340;  diphenoxylate-atropine (LOMOTIL) 2.5-0.025 MG per tablet 1 tablet, 1 tablet, Oral, Q4H PRN, Leslye Peer, MD, 1 tablet at 04/07/11 1329 divalproex (DEPAKOTE) DR tablet 1,000 mg, 1,000 mg, Oral, Q12H, Crystal Stillinger Robertson, PHARMD;  famotidine (PEPCID) tablet 20 mg, 20 mg, Oral, Daily, Jay K. Allena Katz, MD, 20 mg at 04/13/11 4098;  feeding supplement (ENSURE CLINICAL STRENGTH) liquid 237 mL, 237 mL, Oral, BID, Waylan Boga Lamberton, RD, 237 mL at 04/13/11 0943;  ferrous sulfate tablet 325 mg, 325 mg, Oral, TID WC, Belkys Regalado, MD, 325 mg at 04/13/11 1208 hydrALAZINE (APRESOLINE) injection 10-40 mg, 10-40 mg, Intravenous, Q4H PRN, Billy Fischer, MD, 20 mg at 04/08/11 1404;  hydrALAZINE (APRESOLINE) tablet 25 mg, 25 mg, Oral, QID, Leslye Peer, MD, 25 mg at 04/13/11 0936;  labetalol (NORMODYNE,TRANDATE) injection 20 mg, 20 mg, Intravenous, Q2H PRN, Bethel Born, MD, 20 mg at 04/07/11 1829;  LORazepam (ATIVAN) injection 2 mg, 2 mg, Intravenous, Q4H PRN, Billy Fischer, MD metoprolol (LOPRESSOR) tablet 100 mg, 100 mg, Oral, BID, Leslye Peer, MD, 100 mg at 04/13/11 0936;  ondansetron (ZOFRAN) injection 4 mg, 4 mg, Intravenous, Q8H PRN, Shelba Flake, MD, 4 mg at 04/08/11 1349;  sodium chloride 0.9 % injection 10 mL, 10 mL, Intracatheter, Q12H, Max Fickle, MD, 10 mL at 04/10/11 2200;  sodium chloride 0.9 % injection 10 mL, 10 mL, Intracatheter, PRN, Max Fickle, MD, 10 mL at 04/13/11  0539 sodium chloride 0.9 % injection 3 mL, 3 mL, Intravenous, Q12H, Max Fickle, MD, 3 mL at 04/10/11 2200;  DISCONTD: sodium chloride 0.9 % injection 10 mL, 10 mL, Intravenous, PRN, Max Fickle, MD, 10 mL at 04/08/11 1804;  DISCONTD: valproate (DEPACON) 1,000 mg in dextrose 5 % 50 mL IVPB, 1,000  mg, Intravenous, Q12H, Aline Brochure, 1,000 mg at 04/13/11 1610  Precautions/Special Needs:  Precautions/Special Needs: Swallowing Swallowing Precautions: ON dysphagia 2, thin liquids  Additional Precautions/Restrictions: Precautions Precautions: Fall Restrictions Weight Bearing Restrictions: No  Therapy Assessments Physical Therapy: Precautions Precautions: Fall Home Living Lives With: Family Receives Help From: Family Type of Home: House Home Layout: One level Home Access: Stairs to enter Entrance Stairs-Rails: None Entrance Stairs-Number of Steps: 2 Bathroom Shower/Tub: Engineer, manufacturing systems: Standard Bathroom Accessibility: Yes How Accessible: Accessible via wheelchair Home Adaptive Equipment: None Prior Function Level of Independence: Independent with basic ADLs;Independent with gait;Independent with transfers Driving: Yes Vocation: Software engineer Motor Movements are Fluid and Coordinated: No Fine Motor Movements are Fluid and Coordinated: No Coordination and Movement Description: myoclonus with movement over entire body, trouble initiating movement and then controlling movements velocity and direction after initiation   Occupational Therapy: Precautions Precautions: Fall Home Living Lives With: Family Receives Help From: Family Type of Home: House Home Layout: One level Home Access: Stairs to enter Entrance Stairs-Rails: None Entrance Stairs-Number of Steps: 2 Bathroom Shower/Tub: Engineer, manufacturing systems: Standard Bathroom Accessibility: Yes How Accessible: Accessible via wheelchair Home Adaptive Equipment: None Prior Function Level of Independence: Independent with basic ADLs;Independent with gait;Independent with transfers Driving: Yes Vocation: Software engineer Motor Movements are Fluid and Coordinated: No Fine Motor Movements are Fluid and Coordinated: No Coordination and Movement Description: myoclonus  with movement over entire body, trouble initiating movement and then controlling movements velocity and direction after initiation  Restrictions Weight Bearing Restrictions: No ADL Eating/Feeding: Performed;Maximal assistance Where Assessed - Eating/Feeding: Bed level Grooming: Performed;Wash/dry face;Moderate assistance Where Assessed - Grooming: Supine, head of bed up Upper Body Bathing: Simulated;+1 Total assistance Where Assessed - Upper Body Bathing: Sitting, bed Lower Body Bathing: Simulated;+1 Total assistance Where Assessed - Lower Body Bathing: Sitting, bed Upper Body Dressing: Simulated;Maximal assistance Where Assessed - Upper Body Dressing: Sitting, bed Lower Body Dressing: Simulated;+1 Total assistance Where Assessed - Lower Body Dressing: Sitting, bed Ambulation Related to ADLs: Pt is non ambulatory at this point. 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, decreased proximal strength, and myoclonus of LEs when seated   SLP Recommendations: Recommendations for Other Services: Rehab consult Follow up Recommendations: Inpatient Rehab Equipment Recommended: Defer to next venue Recommended Consults: Other (Comment) (FEES to be determined) Solid Consistency: Dysphagia 1 (Puree) Liquid Consistency: Thin (cup sips of water only) Liquid Administration via: Cup;No straw Medication Administration: Whole meds with liquid Supervision: Full supervision/cueing for compensatory strategies Compensations: Small sips/bites;Slow rate;Clear throat intermittently;Effortful swallow;Multiple dry swallows after each bite/sip Postural Changes and/or Swallow Maneuvers: Seated upright 90 degrees;Upright 30-60 min after meal;Out of bed for meals Oral Care Recommendations: Oral care before and after PO Recommendations for Other Services: Rehab consult Follow up Recommendations: Inpatient Rehab  Prior Function: Level of Independence: Independent with basic ADLs;Independent  with gait;Independent with transfers Driving: Yes Vocation: Student ADL Eating/Feeding: Performed;Maximal assistance Where Assessed - Eating/Feeding: Bed level Grooming: Performed;Wash/dry face;Moderate assistance Where Assessed - Grooming: Supine, head of bed up Upper Body Bathing: Simulated;+1 Total assistance Where Assessed - Upper Body Bathing: Sitting, bed Lower Body Bathing:  Simulated;+1 Total assistance Where Assessed - Lower Body Bathing: Sitting, bed Upper Body Dressing: Simulated;Maximal assistance Where Assessed - Upper Body Dressing: Sitting, bed Lower Body Dressing: Simulated;+1 Total assistance Where Assessed - Lower Body Dressing: Sitting, bed Ambulation Related to ADLs: Pt is non ambulatory at this point. 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, sports on break  ADLs/Mobility: ADL Eating/Feeding: Performed;Maximal assistance Where Assessed - Eating/Feeding: Bed level Grooming: Performed;Wash/dry face;Moderate assistance Where Assessed - Grooming: Supine, head of bed up Upper Body Bathing: Simulated;+1 Total assistance Where Assessed - Upper Body Bathing: Sitting, bed Lower Body Bathing: Simulated;+1 Total assistance Where Assessed - Lower Body Bathing: Sitting, bed Upper Body Dressing: Simulated;Maximal assistance Where Assessed - Upper Body Dressing: Sitting, bed Lower Body Dressing: Simulated;+1 Total assistance Where Assessed - Lower Body Dressing:  Sitting, bed Ambulation Related to ADLs: Pt is non ambulatory at this point. 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, decreased proximal strength, and myoclonus of LEs when seated   Bed Mobility Bed Mobility: Yes Rolling Right: 3: Mod assist Rolling Right Details (indicate cue type and reason): pt able to reach across body to grab onto rail to help with roll, pt has myoclonus throughout entire motion; pt = 70% Rolling Left: 3: Mod assist Rolling Left Details (indicate cue type and reason): pt = 70% Supine to Sit: 2: Max assist Supine to Sit Details (indicate cue type and reason): pt = 50%, pt doesnt have much trunk musculature and again has that same myoclonus  Sitting - Scoot to Edge of Bed: 3: Mod assist Sitting - Scoot to Edge of Bed Details (indicate cue type and reason): pt = 50%  Transfers Transfers: Yes Sit to Stand: 1: +2 Total assist Sit to Stand Details (indicate cue type and reason): pt = 20%; was able to help with initial stand but then myoclonus takes over and is unable to stand up at all  Stand to Sit: 1: +2 Total assist Stand to Sit Details: pt = 20% Squat Pivot Transfers: 1: +2 Total assist;Patient percentage (comment) Squat Pivot Transfer Details (indicate cue type and reason): pt = 20% with transfer, has myoclonus during movement.  Ambulation/Gait Ambulation/Gait: No Stairs: No Wheelchair Mobility Wheelchair Mobility: No Posture/Postural Control Posture/Postural Control: No significant limitations Balance Balance Assessed: Yes Static Sitting Balance Static Sitting - Balance Support: Bilateral upper extremity supported (stable unless myoclonus kicks in then has limited control ) Static Sitting - Level of Assistance: 3: Mod assist  Home Assistive Devices/Equipment:  Home Assistive Devices/Equipment Home Assistive Devices/Equipment: None  Discharge Planning:  Living Arrangements: Other (Comment) (dorm at college) Support Systems:  Parent;Family members;Friends/neighbors Do you have any problems obtaining your medications?: No Type of Residence: Other (Comment) (college dorm) Home Care Services: No Patient expects to be discharged to:: mother's home Case Management Consult Needed: No  Current Functional Levels:  Bladder Continence: Foley catheter Bowel Management: Last BM 04/08/11  Previous Home Environment:  Living Arrangements: Other (Comment) (dorm at college) Support Systems: Parent;Family members;Friends/neighbors Do you have any problems obtaining your medications?:  No Type of Residence: Other (Comment) (college dorm) Home Care Services: No Patient expects to be discharged to:: mother's home  Discharge Living Setting:  Plans for Discharge Living Setting: House;Lives with (comment) (Will go to mom's house) Discharge Living Setting Number of Levels: 1 Discharge Living Setting Number of Steps: 2 steps back and 5-6 steps front entry Discharge Living Setting is Bedroom on Main Floor?: Yes Discharge Living Setting is Bathroom on Main Floor?: Yes  Social/Family/Support Systems: Patient Roles: Other (Comment) (Student) Contact Information: Rogelia Boga (h) [phone] (c) [phone] [place] - Dad (c) [phone]) Anticipated Caregiver: Darreld Mclean - mom Anticipated Caregiver's Contact Information: Darreld Mclean (c) [phone] Ability/Limitations of Caregiver: Mom currently not employed Caregiver Availability: 24/7 Discharge Plan Discussed with Primary Caregiver: Yes Is Caregiver In Agreement with Plan?: Yes Does Caregiver/Family have Issues with Lodging/Transportation while Pt is in Rehab?: No  Goals/Additional Needs:  Patient/Family Goal for Rehab: PT/OT/ST S/min A goals (ELOS = 3 weeks) Cultural Considerations: none Equipment Needs: TBD Pt/Family Agrees to Admission and willing to participate: Yes Program Orientation Provided & Reviewed with Pt/Caregiver Including Roles  & Responsibilities: Yes (Provided to patient  and mom)  Preadmission Screen Completed By:  Trish Mage, 04/13/2011 12:46 PM  Patient's condition:  Please see physician update to information in consult dated 04/07/11.  Preadmission Screen Competed by: Roderic Palau, RN, Time/Date,1242/04/12/13.  Discussed status with Dr. Riley Kill on 04/13/11 at 1244 (time/date) and received telephone approval for admission today.  Admission Coordinator:  Trish Mage, time1244/Date01/21/13

## 2011-04-08 NOTE — Progress Notes (Signed)
Speech Pathology: Dysphagia Treatment Note  Patient was observed with : Mechanical Soft / Ground and Thin liquids.  Patient was noted to have s/s of aspiration : No  Lung Sounds:  diminished Temperature: 98.6  Patient required: min verbal cues to consistently follow precautions/strategies  Clinical Impression: Patient seen for f/u diet tolerance assessment and potential to advance. Patient with increased alertness, upright in bed, decreased dysarthria, and improving ROM to lips and tongue with decreased spasticity. SLP provided po trials noted above. Patient without overt s/s of aspiration, min verbal cues required to utilize straw which was successful at eliminating anterior labial spillage of liquids due to remaining mild-mod spasticity. Patient able to orally transit soft solids today with appropriate A-P transit time and independent clearance of oral cavity utilizing liquid wash. Overall, oral phase of swallowing improving with improved alertness.   Recommendations:  1. Will continue current diet as mentation has fluctuated from day to day and f/u in 1-2 days to ensure continued tolerance and improvement prior to diet advancement.  2. Continue to recommend cognitive-linguistic evaluation given hypoxic episode, although mentation is also improving.   Pain:   none Intervention Required:   No   Goals: Progressing  Ferdinand Lango MA, CCC-SLP [phone]

## 2011-04-09 ENCOUNTER — Encounter (HOSPITAL_COMMUNITY): Payer: Self-pay | Admitting: Radiology

## 2011-04-09 LAB — BASIC METABOLIC PANEL
Calcium: 7.9 mg/dL — ABNORMAL LOW (ref 8.4–10.5)
GFR calc Af Amer: 36 mL/min — ABNORMAL LOW (ref >90)
GFR calc non Af Amer: 31 mL/min — ABNORMAL LOW (ref >90)
Glucose, Bld: 91 mg/dL (ref 70–99)
Potassium: 3.2 mEq/L — ABNORMAL LOW (ref 3.5–5.1)
Sodium: 137 mEq/L (ref 135–145)

## 2011-04-09 MED ORDER — POTASSIUM CHLORIDE 20 MEQ/15ML (10%) PO LIQD
40.0000 meq | Freq: Once | ORAL | Status: DC
  Filled 2011-04-09: qty 30

## 2011-04-09 MED ORDER — POTASSIUM CHLORIDE CRYS ER 20 MEQ PO TBCR
40.0000 meq | EXTENDED_RELEASE_TABLET | Freq: Once | ORAL | Status: AC
  Administered 2011-04-09: 40 meq via ORAL

## 2011-04-09 MED ORDER — POTASSIUM CHLORIDE CRYS ER 20 MEQ PO TBCR
EXTENDED_RELEASE_TABLET | ORAL | Status: AC
  Filled 2011-04-09: qty 2

## 2011-04-09 MED ORDER — ENSURE CLINICAL ST REVIGOR PO LIQD
237.0000 mL | Freq: Two times a day (BID) | ORAL | Status: DC
  Administered 2011-04-09: 237 mL via ORAL
  Administered 2011-04-09: 14:00:00 via ORAL
  Administered 2011-04-10 – 2011-04-11 (×3): 237 mL via ORAL
  Administered 2011-04-11: 11:00:00 via ORAL
  Administered 2011-04-12: 237 mL via ORAL
  Administered 2011-04-12: 10:00:00 via ORAL
  Administered 2011-04-13: 237 mL via ORAL

## 2011-04-09 NOTE — Progress Notes (Signed)
I have seen and examined this patient and agree with the assessment/plan as outlined above by Ashley Royalty MD (PGY2). Renal function continues to show slow improvement. 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

## 2011-04-09 NOTE — Progress Notes (Signed)
Speech Pathology: Dysphagia Treatment Note  Patient was observed with : Mechanical Soft / Ground and Thin liquids.  Patient was noted to have s/s of aspiration : No  Lung Sounds:  clear Temperature: afebrile  Patient required: supervision cues to consistently follow precautions/strategies  Clinical Impression: Patient seen for f/u diet tolerance assessment including potential to advance diet. Oral skills continuing to improve. Patient now with only mild oral spasticity and mildly decreased lingual coordination. SLP provided po trials to assess for potential upgrade of solids. Patient able to consume mechanical soft solids with appropriate A-P transit time, intact rotary mastication, and full clearance of oral cavity. No s/s of aspiration observed. Patient judged safe at this time to advance to a regular diet. Education provided regarding change in texture and need to continue general safe swallowing strategies for small bites and sips to decrease risk of aspiration.    Recommendations:  1. Patient may advance to a regular diet, thin liquid 2. General safe swallowing strategies 3. No SLP f/u for dysphagia indicated at this time. Will f/u for cognitive-linguistic treatment only.   Pain:   none Intervention Required:   No   Goals: Met  Ferdinand Lango MA, CCC-SLP [phone]

## 2011-04-09 NOTE — Progress Notes (Signed)
Rehab admissions - Continuing to follow.  Noted patient to have renal biopsy tomorrow.  Once all tests and procedures are complete, will consider for inpatient rehab admission.  Call me for questions.  Pager [phone]

## 2011-04-09 NOTE — Progress Notes (Signed)
Utilization review completed.  

## 2011-04-09 NOTE — H&P (Signed)
Heather Sims is an 21 y.o. female.   Chief Complaint: Proteinuria;cardiac arrest; respiratory arrest; HTN HPI: decrease urine output Scheduled for random renal biopsy 1/18  Past Medical History  Diagnosis Date  . Pneumonia last 2 weeks    'walking pneumonia'    No past surgical history on file.  No family history on file. Social History:  reports that she has never smoked. She does not have any smokeless tobacco history on file. She reports that she does not drink alcohol or use illicit drugs.  Allergies: No Known Allergies  Medications Prior to Admission  Medication Dose Route Frequency Provider Last Rate Last Dose  . 0.9 %  sodium chloride infusion   Intravenous Continuous Max Fickle, MD 20 mL/hr at 04/08/11 1729 20 mL/hr at 04/08/11 1729  . amLODipine (NORVASC) tablet 2.5 mg  2.5 mg Oral Daily Belkys Regalado, MD   2.5 mg at 04/09/11 1058  . antiseptic oral rinse (BIOTENE) solution 15 mL  15 mL Mouth Rinse BID Max Fickle, MD   15 mL at 04/09/11 1123  . aspirin chewable tablet 324 mg  324 mg Oral NOW Max Fickle, MD       Or  . aspirin suppository 300 mg  300 mg Rectal NOW Max Fickle, MD   300 mg at 03/27/11 0007  . aspirin suppository 300 mg  300 mg Rectal NOW Max Fickle, MD      . chlorproMAZINE (THORAZINE) 25 mg in sodium chloride 0.9 % 25 mL IVPB  25 mg Intravenous Once Rakesh V. Vassie Loll, MD   25 mg at 04/02/11 2140  . chlorproMAZINE (THORAZINE) 25 mg in sodium chloride 0.9 % 25 mL IVPB  25 mg Intravenous Once Shelba Flake, MD   25 mg at 04/03/11 0413  . chlorproMAZINE (THORAZINE) 25 mg in sodium chloride 0.9 % 25 mL IVPB  25 mg Intravenous Once Shelba Flake, MD   25 mg at 04/04/11 0507  . cisatracurium (NIMBEX) bolus via infusion 3 mg  0.05 mg/kg Intravenous Once PRN Max Fickle, MD      . cisatracurium (NIMBEX) injection 6 mg  0.1 mg/kg Intravenous Once Max Fickle, MD   6 mg at 03/26/11 2222  . cisatracurium (NIMBEX) injection 6  mg  0.1 mg/kg Intravenous Once PRN Max Fickle, MD      . clonazePAM Scarlette Calico) tablet 0.5 mg  0.5 mg Oral Daily Belkys Regalado, MD   0.5 mg at 04/09/11 1058  . cloNIDine (CATAPRES - Dosed in mg/24 hr) patch 0.3 mg  0.3 mg Transdermal Weekly Christian M Buettner, PHARMD   0.3 mg at 04/08/11 1340  . dextrose 50 % solution        25 mL at 03/28/11 1029  . diphenoxylate-atropine (LOMOTIL) 2.5-0.025 MG per tablet 1 tablet  1 tablet Oral Q4H PRN Leslye Peer, MD   1 tablet at 04/07/11 1329  . diphenoxylate-atropine (LOMOTIL) 2.5-0.025 MG per tablet 2 tablet  2 tablet Oral Once Leslye Peer, MD   2 tablet at 04/06/11 1438  . famotidine (PEPCID) tablet 20 mg  20 mg Oral Daily Jay K. Allena Katz, MD   20 mg at 04/09/11 1058  . fentaNYL (SUBLIMAZE) bolus via infusion 25 mcg  25 mcg Intravenous STAT Ricki Rodriguez, MD   25 mcg at 03/27/11 1322  . fentaNYL (SUBLIMAZE) bolus via infusion 50 mcg  50 mcg Intravenous STAT Ricki Rodriguez, MD   50 mcg at 03/27/11 1324  . fentaNYL (SUBLIMAZE) injection 100 mcg  100 mcg  Intravenous Once Raeford Razor, MD   100 mcg at 03/26/11 2020  . furosemide (LASIX) 160 mg in dextrose 5 % 50 mL IVPB  160 mg Intravenous Q6H Sadie Haber, MD   160 mg at 04/02/11 0515  . furosemide (LASIX) 160 mg in dextrose 5 % 50 mL IVPB  160 mg Intravenous Q8H Sadie Haber, MD   160 mg at 04/03/11 0259  . furosemide (LASIX) 160 mg in dextrose 5 % 50 mL IVPB  160 mg Intravenous BID Ardyth Gal, MD   160 mg at 04/04/11 0827  . furosemide (LASIX) injection 80 mg  80 mg Intravenous Once Maree Krabbe, MD   80 mg at 03/28/11 2355  . furosemide (LASIX) injection 80 mg  80 mg Intravenous Once Ardyth Gal, MD   80 mg at 03/29/11 1247  . furosemide (LASIX) tablet 120 mg  120 mg Oral BID Sadie Haber, MD   120 mg at 04/09/11 0927  . heparin injection 5,000 Units  5,000 Units Subcutaneous Q8H Jay K. Allena Katz, MD   5,000 Units at 04/09/11 0544  . hydrALAZINE (APRESOLINE)  injection 10-40 mg  10-40 mg Intravenous Q4H PRN Billy Fischer, MD   20 mg at 04/08/11 1404  . hydrALAZINE (APRESOLINE) tablet 25 mg  25 mg Oral QID Leslye Peer, MD   25 mg at 04/09/11 1058  . influenza  inactive virus vaccine (FLUZONE/FLUARIX) injection 0.5 mL  0.5 mL Intramuscular Tomorrow-1000 Max Fickle, MD      . influenza  inactive virus vaccine (FLUZONE/FLUARIX) injection 0.5 mL  0.5 mL Intramuscular Tomorrow-1000 Lora Poteet Seay, PHARMD   0.5 mL at 03/31/11 0911  . labetalol (NORMODYNE,TRANDATE) injection 20 mg  20 mg Intravenous STAT Ricki Rodriguez, MD   20 mg at 03/27/11 1330  . labetalol (NORMODYNE,TRANDATE) injection 20 mg  20 mg Intravenous Q2H PRN Bethel Born, MD   20 mg at 04/07/11 1829  . LORazepam (ATIVAN) 2 MG/ML injection           . LORazepam (ATIVAN) injection 2 mg  2 mg Intravenous Once Raeford Razor, MD   2 mg at 03/26/11 2020  . LORazepam (ATIVAN) injection 2 mg  2 mg Intravenous Q4H PRN Billy Fischer, MD      . methylPREDNISolone sodium succinate (SOLU-MEDROL) 500 mg in sodium chloride 0.9 % 50 mL IVPB  500 mg Intravenous Daily Maree Krabbe, MD   500 mg at 03/30/11 1017  . metoprolol (LOPRESSOR) tablet 100 mg  100 mg Oral BID Leslye Peer, MD   100 mg at 04/09/11 1058  . midazolam (VERSED) bolus via infusion 1 mg  1 mg Intravenous STAT Ricki Rodriguez, MD   1 mg at 03/27/11 1322  . midazolam (VERSED) bolus via infusion 1 mg  1 mg Intravenous STAT Ricki Rodriguez, MD   1 mg at 03/27/11 1336  . ondansetron (ZOFRAN) injection 4 mg  4 mg Intravenous Q8H PRN Shelba Flake, MD   4 mg at 04/08/11 1349  . phenytoin (DILANTIN) 1,000 mg in sodium chloride 0.9 % 250 mL IVPB  1,000 mg Intravenous Once Juliann Pulse, PHARMD   1,000 mg at 03/28/11 1424  . potassium chloride 10 mEq in 100 mL IVPB  10 mEq Intravenous Q1 Hr x 2 Belkys Regalado, MD   10 mEq at 04/08/11 1636  . potassium chloride 10 mEq in 50 mL *CENTRAL LINE* IVPB  10 mEq Intravenous Q1 Hr x 4 Max Fickle, MD  10 mEq at 04/02/11 1816  . potassium chloride 20 MEQ/15ML (10%) liquid 40 mEq  40 mEq Oral Once Cody Matthews   40 mEq at 04/08/11 1340  . promethazine (PHENERGAN) injection 25 mg  25 mg Intravenous Once Shelba Flake, MD   25 mg at 04/05/11 0221  . propofol (DIPRIVAN) 10 MG/ML infusion  5-70 mcg/kg/min Intravenous Once Raeford Razor, MD 7.2 mL/hr at 03/26/11 2126 20 mcg/kg/min at 03/26/11 2126  . propofol (DIPRIVAN) 10 MG/ML infusion      14.4 mL/hr at 03/27/11 0013 1,000 mg at 03/27/11 0013  . sodium chloride 0.9 % bolus 1,000 mL  1,000 mL Intravenous Once Raeford Razor, MD   1,000 mL at 03/26/11 2010  . sodium chloride 0.9 % bolus 1,000 mL  1,000 mL Intravenous Once Raeford Razor, MD   1,000 mL at 03/26/11 2108  . sodium chloride 0.9 % injection 10 mL  10 mL Intravenous PRN Max Fickle, MD   10 mL at 04/08/11 1804  . sodium chloride 0.9 % injection 10 mL  10 mL Intracatheter Q12H Max Fickle, MD   10 mL at 04/07/11 0902  . sodium chloride 0.9 % injection 10 mL  10 mL Intracatheter PRN Max Fickle, MD      . sodium chloride 0.9 % injection 3 mL  3 mL Intravenous Q12H Max Fickle, MD      . sodium polystyrene (KAYEXALATE) 15 GM/60ML suspension 30 g  30 g Oral Once Ardyth Gal, MD   30 g at 03/29/11 1303  . valproate (DEPACON) 1,000 mg in dextrose 5 % 50 mL IVPB  1,000 mg Intravenous Once Aline Brochure   1,000 mg at 04/03/11 1106  . valproate (DEPACON) 1,000 mg in dextrose 5 % 50 mL IVPB  1,000 mg Intravenous Q12H Charles R Stewart   1,000 mg at 04/09/11 1058  . valproate (DEPACON) 500 mg in dextrose 5 % 50 mL IVPB  500 mg Intravenous Once Aline Brochure   500 mg at 04/04/11 1111  . vancomycin (VANCOCIN) IVPB 1000 mg/200 mL premix  1,000 mg Intravenous Once Raeford Razor, MD   1,000 mg at 03/26/11 2035  . white petrolatum (VASELINE) gel        0.2 application at 04/05/11 0034  . DISCONTD: 0.9 %  sodium chloride infusion  250 mL Intravenous PRN  Max Fickle, MD 10 mL/hr at 03/29/11 0307 250 mL at 03/29/11 0307  . DISCONTD: 0.9 %  sodium chloride infusion   Intravenous Continuous PRN Max Fickle, MD 10 mL/hr at 04/02/11 0039    . DISCONTD: 0.9 %  sodium chloride infusion   Intravenous Continuous Billy Fischer, MD 15 mL/hr at 03/31/11 1800    . DISCONTD: antiseptic oral rinse (BIOTENE) solution 15 mL  15 mL Mouth Rinse QID Max Fickle, MD   15 mL at 04/03/11 1600  . DISCONTD: artificial tears (LACRILUBE) ophthalmic ointment 1 application  1 application Both Eyes Q8H Max Fickle, MD   1 application at 03/31/11 0631  . DISCONTD: ceFEPIme (MAXIPIME) 1 g in dextrose 5 % 50 mL IVPB  1 g Intravenous Q12H Raeford Razor, MD   1 g at 03/26/11 2047  . DISCONTD: cefTRIAXone (ROCEPHIN) 1 g in dextrose 5 % 50 mL IVPB  1 g Intravenous Q24H Eugene Garnet, PHARMD   1 g at 04/02/11 0031  . DISCONTD: chlorhexidine (PERIDEX) 0.12 % solution 15 mL  15 mL Mouth Rinse BID Max Fickle, MD   15 mL at 04/02/11 2030  .  DISCONTD: cisatracurium (NIMBEX) 200 mg in sodium chloride 0.9 % 200 mL infusion  1-1.5 mcg/kg/min Intravenous Continuous Max Fickle, MD   1.5 mcg/kg/min at 03/28/11 0021  . DISCONTD: clonazePAM (KLONOPIN) tablet 0.5 mg  0.5 mg Oral QID Carin Hock Stewart   0.5 mg at 04/03/11 2127  . DISCONTD: clonazePAM (KLONOPIN) tablet 0.5 mg  0.5 mg Oral Q6H Charles R Stewart   0.5 mg at 04/06/11 1142  . DISCONTD: cloNIDine (CATAPRES - Dosed in mg/24 hr) patch 0.1 mg  0.1 mg Transdermal Weekly Billy Fischer, MD   0.1 mg at 04/05/11 1200  . DISCONTD: cloNIDine (CATAPRES - Dosed in mg/24 hr) patch 0.3 mg  0.3 mg Transdermal Weekly Leslye Peer, MD      . DISCONTD: famotidine (PEPCID) 40 MG/5ML suspension 20 mg  20 mg Per Tube QHS Severiano Gilbert, PHARMD   20 mg at 04/01/11 2128  . DISCONTD: famotidine (PEPCID) IVPB 20 mg  20 mg Intravenous QHS Max Fickle, MD   20 mg at 03/30/11 2230  . DISCONTD: feeding supplement (NEPRO CARB  STEADY) liquid 1,000 mL  1,000 mL Oral Continuous Ardyth Gal, MD 20 mL/hr at 03/31/11 2208 1,000 mL at 03/31/11 2208  . DISCONTD: feeding supplement (OSMOLITE 1.2 CAL) liquid 1,000 mL  1,000 mL Oral Continuous Billy Fischer, MD 20 mL/hr at 03/28/11 1628 1,000 mL at 03/28/11 1628  . DISCONTD: fentaNYL (SUBLIMAZE) 10 mcg/mL in sodium chloride 0.9 % 250 mL infusion  120 mcg/hr Intravenous Continuous Colleen Can, PHARMD   120 mcg/hr at 03/27/11 1900  . DISCONTD: fentaNYL (SUBLIMAZE) 10 mcg/mL in sodium chloride 0.9 % 250 mL infusion  50-400 mcg/hr Intravenous Titrated Max Fickle, MD   25 mcg/hr at 04/02/11 1000  . DISCONTD: fentaNYL (SUBLIMAZE) bolus via infusion 25 mcg  25 mcg Intravenous Once Robynn Pane, MD      . DISCONTD: fentaNYL (SUBLIMAZE) bolus via infusion 50 mcg  50 mcg Intravenous STAT Robynn Pane, MD      . DISCONTD: fentaNYL (SUBLIMAZE) bolus via infusion 50-100 mcg  50-100 mcg Intravenous Q6H PRN Max Fickle, MD   100 mcg at 04/01/11 2031  . DISCONTD: fentaNYL (SUBLIMAZE) injection 3,000-6,000 mcg  50-100 mcg/kg Intravenous Continuous Max Fickle, MD 12 mL/hr at 03/26/11 2219 120 mcg at 03/26/11 2219  . DISCONTD: furosemide (LASIX) injection 40 mg  40 mg Intravenous Q6H Max Fickle, MD      . DISCONTD: furosemide (LASIX) injection 40 mg  40 mg Intravenous Q6H Max Fickle, MD   40 mg at 04/02/11 0928  . DISCONTD: hydrALAZINE (APRESOLINE) 20 MG/ML injection           . DISCONTD: hydrALAZINE (APRESOLINE) injection 10 mg  10 mg Intravenous Q6H PRN Max Fickle, MD   10 mg at 04/04/11 0207  . DISCONTD: hydrALAZINE (APRESOLINE) injection 10 mg  10 mg Intravenous Q8H Max Fickle, MD   10 mg at 04/04/11 0541  . DISCONTD: hydrALAZINE (APRESOLINE) injection 10 mg  10 mg Intravenous Q6H Billy Fischer, MD   10 mg at 04/05/11 1202  . DISCONTD: hydrALAZINE (APRESOLINE) injection 10-20 mg  10-20 mg Intravenous Q4H PRN Billy Fischer, MD   10 mg at  03/27/11 0251  . DISCONTD: hydrALAZINE (APRESOLINE) injection 10-40 mg  10-40 mg Intravenous Q4H PRN Billy Fischer, MD   20 mg at 03/30/11 2102  . DISCONTD: hydrALAZINE (APRESOLINE) injection 20 mg  20 mg Intravenous Q6H Billy Fischer, MD   20 mg at 04/06/11 1142  .  DISCONTD: insulin aspart (novoLOG) injection 0-15 Units  0-15 Units Subcutaneous Q4H Shelba Flake, MD   2 Units at 03/31/11 0035  . DISCONTD: labetalol (NORMODYNE) tablet 200 mg  200 mg Per Tube BID Sadie Haber, MD   200 mg at 03/31/11 2224  . DISCONTD: labetalol (NORMODYNE) tablet 300 mg  300 mg Per Tube BID Max Fickle, MD   300 mg at 04/01/11 2128  . DISCONTD: labetalol (NORMODYNE) tablet 400 mg  400 mg Per Tube BID Max Fickle, MD   400 mg at 04/02/11 0926  . DISCONTD: labetalol (NORMODYNE,TRANDATE) 4 mg/mL in dextrose 5 % 125 mL infusion  1 mg/min Intravenous Titrated Billy Fischer, MD 300 mL/hr at 03/27/11 1330 20 mg/min at 03/27/11 1330  . DISCONTD: labetalol (NORMODYNE,TRANDATE) injection 20 mg  20 mg Intravenous Q10 min PRN Billy Fischer, MD   20 mg at 03/26/11 2354  . DISCONTD: labetalol (NORMODYNE,TRANDATE) injection 20 mg  20 mg Intravenous STAT Robynn Pane, MD      . DISCONTD: labetalol (NORMODYNE,TRANDATE) injection 20 mg  20 mg Intravenous Q2H PRN Max Fickle, MD   20 mg at 04/01/11 1730  . DISCONTD: LORazepam (ATIVAN) injection 2 mg  2 mg Intravenous Q2H PRN Tammy Parrett, NP   2 mg at 04/03/11 0859  . DISCONTD: LORazepam (ATIVAN) injection 2-4 mg  2-4 mg Intravenous Q4H PRN Max Fickle, MD   2 mg at 04/04/11 0450  . DISCONTD: metoprolol (LOPRESSOR) injection 5 mg  5 mg Intravenous Q6H Max Fickle, MD   5 mg at 04/04/11 0541  . DISCONTD: metoprolol (LOPRESSOR) tablet 50 mg  50 mg Oral BID Rakesh V. Vassie Loll, MD   50 mg at 04/06/11 0915  . DISCONTD: metoprolol tartrate (LOPRESSOR) tablet 25 mg  25 mg Oral BID Billy Fischer, MD   25 mg at 04/04/11 2154  . DISCONTD: metroNIDAZOLE (FLAGYL) IVPB  500 mg  500 mg Intravenous Q8H Raeford Razor, MD   500 mg at 03/26/11 2049  . DISCONTD: midazolam (VERSED) 1 mg/mL in sodium chloride 0.9 % 50 mL infusion  4 mg/hr Intravenous Continuous Billy Fischer, MD   4 mg/hr at 03/27/11 2236  . DISCONTD: midazolam (VERSED) bolus via infusion 1 mg  1 mg Intravenous Once Robynn Pane, MD      . DISCONTD: midazolam (VERSED) bolus via infusion 1 mg  1 mg Intravenous Once Robynn Pane, MD      . DISCONTD: midazolam (VERSED) injection 1-2 mg  1-2 mg Intravenous Q2H PRN Bethel Born, MD   2 mg at 03/30/11 1839  . DISCONTD: moxifloxacin (AVELOX) IVPB 400 mg  400 mg Intravenous Q24H Max Fickle, MD      . DISCONTD: moxifloxacin (AVELOX) IVPB 400 mg  400 mg Intravenous Q24H Eugene Garnet, PHARMD   400 mg at 03/27/11 2304  . DISCONTD: nitroGLYCERIN (NITROGLYN) 2 % ointment 1 inch  1 inch Topical Q6H PRN Max Fickle, MD   1 inch at 04/02/11 1705  . DISCONTD: nitroGLYCERIN (NITROGLYN) 2 % ointment 1 inch  1 inch Topical Q6H Max Fickle, MD   1 inch at 04/04/11 0542  . DISCONTD: norepinephrine (LEVOPHED) 4,000 mcg in dextrose 5 % 250 mL infusion  0.5-10 mcg/min Intravenous Titrated Billy Fischer, MD   2 mcg/min at 03/29/11 0530  . DISCONTD: oseltamivir (TAMIFLU) 6 MG/ML suspension 75 mg  75 mg Oral BID Eugene Garnet, PHARMD      . DISCONTD: oseltamivir (TAMIFLU) 6 MG/ML suspension 75 mg  75 mg  Per Tube BID Max Fickle, MD   75 mg at 03/26/11 2330  . DISCONTD: oseltamivir (TAMIFLU) capsule 75 mg  75 mg Oral BID Max Fickle, MD      . DISCONTD: pantoprazole (PROTONIX) EC tablet 40 mg  40 mg Oral Q1200 Leslye Peer, MD   40 mg at 04/08/11 0932  . DISCONTD: pantoprazole (PROTONIX) injection 40 mg  40 mg Intravenous Q12H Billy Fischer, MD   40 mg at 04/06/11 0915  . DISCONTD: phenytoin (DILANTIN) injection 100 mg  100 mg Intravenous Q8H Juliann Pulse, PHARMD      . DISCONTD: phenytoin (DILANTIN) injection 100 mg  100 mg Intravenous Q8H  Juliann Pulse, PHARMD   100 mg at 03/31/11 1610  . DISCONTD: phenytoin (DILANTIN) injection 100 mg  100 mg Intravenous BID Severiano Gilbert, PHARMD      . DISCONTD: phenytoin (DILANTIN) injection 100 mg  100 mg Intravenous QHS Severiano Gilbert, PHARMD   100 mg at 04/02/11 2305  . DISCONTD: promethazine (PHENERGAN) injection 12.5 mg  12.5 mg Intravenous Q6H PRN Mcarthur Rossetti. Tyson Alias, MD   12.5 mg at 04/07/11 1326  . DISCONTD: propofol (DIPRIVAN) 10 MG/ML infusion  5-70 mcg/kg/min Intravenous Titrated Tammy Parrett, NP   10 mcg/kg/min at 03/30/11 1854  . DISCONTD: sodium bicarbonate tablet 1,300 mg  1,300 mg Oral TID Ardyth Gal, MD   1,300 mg at 04/02/11 1705  . DISCONTD: valproate (DEPACON) 500 mg in dextrose 5 % 50 mL IVPB  500 mg Intravenous Q12H Charles R Stewart   500 mg at 04/04/11 0541  . DISCONTD: valproate (DEPACON) 750 mg in dextrose 5 % 50 mL IVPB  750 mg Intravenous Q12H Charles R Stewart   750 mg at 04/04/11 2156  . DISCONTD: vancomycin (VANCOCIN) 750 mg in sodium chloride 0.9 % 150 mL IVPB  750 mg Intravenous Q24H Juliann Pulse, PHARMD      . DISCONTD: vancomycin (VANCOCIN) IVPB 1000 mg/200 mL premix  1,000 mg Intravenous Q24H Eugene Garnet, PHARMD   1,000 mg at 03/27/11 1925   No current outpatient prescriptions on file as of 04/09/2011.    Results for orders placed during the hospital encounter of 03/26/11 (from the past 48 hour(s))  BASIC METABOLIC PANEL     Status: Abnormal   Collection Time   04/08/11  5:50 AM      Component Value Range Comment   Sodium 139  135 - 145 (mEq/L)    Potassium 3.2 (*) 3.5 - 5.1 (mEq/L)    Chloride 106  96 - 112 (mEq/L)    CO2 21  19 - 32 (mEq/L)    Glucose, Bld 91  70 - 99 (mg/dL)    BUN 83 (*) 6 - 23 (mg/dL)    Creatinine, Ser 9.60 (*) 0.50 - 1.10 (mg/dL)    Calcium 7.9 (*) 8.4 - 10.5 (mg/dL)    GFR calc non Af Amer 29 (*) >90 (mL/min)    GFR calc Af Amer 34 (*) >90 (mL/min)   CBC     Status: Abnormal   Collection Time   04/08/11   5:50 AM      Component Value Range Comment   WBC 14.7 (*) 4.0 - 10.5 (K/uL)    RBC 3.55 (*) 3.87 - 5.11 (MIL/uL)    Hemoglobin 10.5 (*) 12.0 - 15.0 (g/dL)    HCT 45.4 (*) 09.8 - 46.0 (%)    MCV 89.3  78.0 - 100.0 (fL)    MCH 29.6  26.0 - 34.0 (pg)    MCHC 33.1  30.0 - 36.0 (g/dL)    RDW 29.5  62.1 - 30.8 (%)    Platelets 279  150 - 400 (K/uL)   PROTEIN / CREATININE RATIO, URINE     Status: Abnormal   Collection Time   04/08/11  4:39 PM      Component Value Range Comment   Creatinine, Urine 77.94      Total Protein, Urine 82.2   NO NORMAL RANGE ESTABLISHED FOR THIS TEST   PROTEIN CREATININE RATIO 1.05 (*) 0.00 - 0.15    BASIC METABOLIC PANEL     Status: Abnormal   Collection Time   04/09/11  8:37 AM      Component Value Range Comment   Sodium 137  135 - 145 (mEq/L)    Potassium 3.2 (*) 3.5 - 5.1 (mEq/L)    Chloride 105  96 - 112 (mEq/L)    CO2 21  19 - 32 (mEq/L)    Glucose, Bld 91  70 - 99 (mg/dL)    BUN 81 (*) 6 - 23 (mg/dL)    Creatinine, Ser 6.57 (*) 0.50 - 1.10 (mg/dL)    Calcium 7.9 (*) 8.4 - 10.5 (mg/dL)    GFR calc non Af Amer 31 (*) >90 (mL/min)    GFR calc Af Amer 36 (*) >90 (mL/min)    Dg Abd Portable 1v  04/08/2011  *RADIOLOGY REPORT*  Clinical Data: Abdominal pain  PORTABLE ABDOMEN - 1 VIEW  Comparison: 04/05/2011  Findings: Gas in non dilated stomach.  Gas in nondilated transverse colon.  Gas is present in the rectum.  Negative for bowel obstruction.  No acute bony abnormality.  IMPRESSION: Nonobstructive bowel gas pattern.  Original Report Authenticated By: Camelia Phenes, M.D.    ROS  Blood pressure 132/78, pulse 66, temperature 97.3 F (36.3 C), temperature source Oral, resp. rate 20, height 5\' 2"  (1.575 m), weight 147 lb 9.6 oz (66.951 kg), last menstrual period 03/02/2011, SpO2 97.00%. Physical Exam   Assessment/Plan Proteinuria Cardiac arrest; respiratory arrest; HTN Pt scheduled for renal biopsy 1/18 pts mother aware of procedure benefits and risks  and agreeable to proceed. Will need [facility] renal from in chart to proceed with procedure 1/18 Check wbc 1/18 am  Carol Loftin A 04/09/2011, 11:48 AM

## 2011-04-09 NOTE — Progress Notes (Addendum)
Nutrition Follow-up  S/p bedside swallow evaluation 1/12. Dysphagia treatment note reviewed 1/17 -- pt judged safe to advance to Regular, thin liquids.  Diet Order:  Dysphagia 2, thin liquid. Per RN, pt consuming ~ 25-50% of meals.  Meds: Scheduled Meds:   . amLODipine  2.5 mg Oral Daily  . antiseptic oral rinse  15 mL Mouth Rinse BID  . clonazePAM  0.5 mg Oral Daily  . cloNIDine  0.3 mg Transdermal Weekly  . famotidine  20 mg Oral Daily  . furosemide  120 mg Oral BID  . heparin  5,000 Units Subcutaneous Q8H  . hydrALAZINE  25 mg Oral QID  . metoprolol tartrate  100 mg Oral BID  . potassium chloride  10 mEq Intravenous Q1 Hr x 2  . potassium chloride  40 mEq Oral Once  . sodium chloride  10 mL Intracatheter Q12H  . sodium chloride  3 mL Intravenous Q12H  . valproate sodium  1,000 mg Intravenous Q12H  . DISCONTD: cloNIDine  0.3 mg Transdermal Weekly   Continuous Infusions:   . sodium chloride 20 mL/hr (04/08/11 1729)  . DISCONTD: sodium chloride 15 mL/hr at 03/31/11 1800   PRN Meds:.diphenoxylate-atropine, hydrALAZINE, labetalol, LORazepam, ondansetron, sodium chloride, sodium chloride, DISCONTD: promethazine  Labs:  CMP     Component Value Date/Time   NA 137 04/09/2011 0837   K 3.2* 04/09/2011 0837   CL 105 04/09/2011 0837   CO2 21 04/09/2011 0837   GLUCOSE 91 04/09/2011 0837   BUN 81* 04/09/2011 0837   CREATININE 2.18* 04/09/2011 0837   CALCIUM 7.9* 04/09/2011 0837   PROT 5.2* 04/06/2011 0530   ALBUMIN 1.7* 04/06/2011 0530   AST 30 04/06/2011 0530   ALT 14 04/06/2011 0530   ALKPHOS 63 04/06/2011 0530   BILITOT 0.2* 04/06/2011 0530   GFRNONAA 31* 04/09/2011 0837   GFRAA 36* 04/09/2011 0837     Intake/Output Summary (Last 24 hours) at 04/09/11 1126 Last data filed at 04/09/11 0600  Gross per 24 hour  Intake    240 ml  Output    450 ml  Net   -210 ml    CBG (last 3)   Basename 04/07/11 0737  GLUCAP 86    Weight Status: 66.9 kg (1/17) -- trending down  Re-estimated  needs:  1700-1900 kcals, 80-95 gm protein  Nutrition Dx:  Inadequate Oral Intake now r/t decreased appetite, ongoing  Goal:  Pt to consistently consume >75% of meals to meet nutrition needs, unmet Monitor: PO intake, labs, weight, I/O's  Intervention/Plan:  Regular diet, thin liquid per SLP recommendation  Add Ensure Clinical Stength PO BID (350 kcals, 13 gm protein per 8 fl oz bottle)  RD to follow for nutrition care plan  Alger Memos Pager #:  [phone]

## 2011-04-09 NOTE — Progress Notes (Signed)
[facility] PROGRESS NOTE    SUBJECTIVE   Patient is sleepy but able to follow all commands.  She is slightly depressed today.  She is working with PT presently.   OBJECTIVE   Vital signs in last 24 hours: Temp:  [97.3 F (36.3 C)-98.2 F (36.8 C)] 97.3 F (36.3 C) (01/17 0500) Pulse Rate:  [80-99] 80  (01/17 0906) Resp:  [20] 20  (01/17 0500) BP: (140-179)/(90-110) 140/90 mmHg (01/17 0906) SpO2:  [95 %-97 %] 97 % (01/17 0500) Weight:  [66.951 kg (147 lb 9.6 oz)] 66.951 kg (147 lb 9.6 oz) (01/17 0500)  Intake/Output from previous day: 01/16 0701 - 01/17 0700 In: 240 [I.V.:240] Out: 450 [Urine:450] Intake/Output this shift:   Nutritional status: Dysphagia  Past Medical History  Diagnosis Date  . Pneumonia last 2 weeks    'walking pneumonia'    Neurologic Exam:  Mental Status: Alert, oriented, thought content appropriate.  Speech fluent without evidence of aphasia. Able to follow 3 step commands without difficulty. Cranial Nerves: II-Visual fields grossly intact. III/IV/VI-Extraocular movements intact.  Pupils reactive bilaterally. V/VII-Smile symmetric VIII-grossly intact IX/X-normal gag XI-bilateral shoulder shrug XII-midline tongue extension Motor: 5/5 bilaterally upper extremities with normal tone and bulk.  Bilateral hip flexion shows able to initiate movement but only sustain for a quick jerk.  She can adduct and abduct hips with 4/ strength and slightly more control but still very jerky.  Knee extension on the right shows more movement and slightly more control--able to hold it off the bed 2 inches for 1-2 seconds.  Left knee extension only shows brief jerking. She cannot DF or PF her ankles but is able to wiggle toes.  After all movements she shows continual myoclonic jerking involving bilateral legs for 1-2 movements.  Sensory: Pinprick and light touch intact throughout, bilaterally Deep Tendon Reflexes: 2+ and  symmetric throughout Plantars: mute bilaterally Cerebellar: Normal finger-to-nose,  Lab Results: Lab Results  Component Value Date/Time   CHOL 296* 03/26/2011 11:00 PM   Lipid Panel No results found for this basename: CHOL,TRIG,HDL,CHOLHDL,VLDL,LDLCALC in the last 72 hours  Studies/Results: Dg Abd Portable 1v  04/08/2011  *RADIOLOGY REPORT*  Clinical Data: Abdominal pain  PORTABLE ABDOMEN - 1 VIEW  Comparison: 04/05/2011  Findings: Gas in non dilated stomach.  Gas in nondilated transverse colon.  Gas is present in the rectum.  Negative for bowel obstruction.  No acute bony abnormality.  IMPRESSION: Nonobstructive bowel gas pattern.  Original Report Authenticated By: Camelia Phenes, M.D.    Medications:     Scheduled:   . amLODipine  2.5 mg Oral Daily  . antiseptic oral rinse  15 mL Mouth Rinse BID  . clonazePAM  0.5 mg Oral Daily  . cloNIDine  0.3 mg Transdermal Weekly  . famotidine  20 mg Oral Daily  . furosemide  120 mg Oral BID  . heparin  5,000 Units Subcutaneous Q8H  . hydrALAZINE  25 mg Oral QID  . metoprolol tartrate  100 mg Oral BID  . potassium chloride  10 mEq Intravenous Q1 Hr x 2  . potassium chloride  40 mEq Oral Once  . sodium chloride  10 mL Intracatheter Q12H  . sodium chloride  3 mL Intravenous Q12H  . valproate sodium  1,000 mg Intravenous Q12H  .  DISCONTD: cloNIDine  0.3 mg Transdermal Weekly  . DISCONTD: pantoprazole  40 mg Oral Q1200    Assessment/Plan:   Hypoxemia with myoclonus (03/26/2011) 1. Hypoxic encephalopathy, improving,speech now back to baseline and able to take part in full conversation/ shows frustration today  2. Generalized seizures, controlled with Depacon 1000 mg every 12 hours.  3. Continues to have myoclonic jerking when attempts to move LE. Clonazepam seems to be helping.   4) patient will need significant PT CIR if able.  No further recommendations.  Neurology will S/O   Felicie Morn PA-C [facility] [phone]  04/09/2011, 9:09 AM

## 2011-04-09 NOTE — Progress Notes (Signed)
Renal Daily Progress Note  Subjective: Patient sleeping in bed.  No complaints Objective:  Vital signs in last 24 hours:  Temp:  [97.3 F (36.3 C)-98.2 F (36.8 C)] 97.3 F (36.3 C) (01/17 0500) Pulse Rate:  [80-99] 80  (01/17 0500) Resp:  [20] 20  (01/17 0500) BP: (154-179)/(96-110) 154/98 mmHg (01/17 0500) SpO2:  [95 %-97 %] 97 % (01/17 0500) Weight:  [147 lb 9.6 oz (66.951 kg)] 147 lb 9.6 oz (66.951 kg) (01/17 0500) Weight change:   Intake/Output: I/O last 3 completed shifts: In: 240 [I.V.:240] Out: 1252 [Urine:1250; Stool:2]  EXAM: Slow to speak but very appropriate, oriented to person and place.  CVS: RRR, 2/6 systolic murmur RS- Grossly clear lungs ABD: +BS, soft,  non-tender to palpation EXT: trace edema Neuro: awake and alert, still some myoclonus in legs and feet.   Lab Results:  Basename 04/08/11 0550 04/07/11 0500  WBC 14.7* 18.6*  HGB 10.5* 10.7*  HCT 31.7* 32.5*  PLT 279 248   BMET  Basename 04/08/11 0550 04/07/11 0500  NA 139 139  K 3.2* 3.6  CL 106 106  CO2 21 21  GLUCOSE 91 89  BUN 83* 85*  CREATININE 2.31* 2.54*  CALCIUM 7.9* 7.9*  PHOS -- 6.6*   LFT No results found for this basename: PROT,ALBUMIN,AST,ALT,ALKPHOS,BILITOT,BILIDIR,IBILI in the last 72 hours PTH: Lab Results  Component Value Date   PTH 250.2* 03/28/2011   CALCIUM 7.9* 04/08/2011   CAION 1.09* 03/26/2011   PHOS 6.6* 04/07/2011  Renal and Serologic Work up: (negative to date except for proteinuria)  HIV Neg  ANA Neg, DSDNA ab 22 (WNL) ASO 87 (WNL)  Diract Antiglobulin complement and IgG Neg  Total complement high, C3, C4 Normal  PTH 250.2 (H)  Kappa Light Chains 8.6 (H) Lamda Light Chains 5.8(H), Ratio 1.46 (WNL)  ANCA Negative  Urine ZOX:WRUEA 870 mg/gm creat 04/04/11; last UA >300 pro, 7-10 RBC (vs TNTC RBC on initial UA)  SPEP:  Total Protien 3.5 (L)  Albumin 45.1 (L)  Alpha 1 globulin 10.8 (H)  Alpha 2 globulin 19.2 (H)  Beta Globulin 5.8  Beta 2 5.1  Gamma  Globulin 14.0  M Spike not detected  UPEP:  Tpro 33.4  Albumin Detected  Alpha 1 Detected  Alpha 2 Detected  Beta Detected  Gamma globulin Detected  Cryoglobulin REPORT Comments: (NOTE) Cryoglobulin, QL Analysis None Detected  Pending:  Glomerular Basement membrane   Studies/Results: Renal Artery Doppler (prelim): No significant RAS, bilateral hyperechoic appearance of kidneys.    Assessment/Plan: 21 year old female with cardiac arrest due to respiratory arrest, who has significant cardiomyopathy and HTN, with presumed acute renal failure, now extubated, and recovering renal function (unknown baseline creatinine; 2.31 on admission; nadir 1.95; peak 4.86) Illness preceded by edema and leg/abd swelling for a month or 2 PTA)  1) Renal Failure- Cr pending this morning, UOP worsened in past 24 hours, unsure if this is accurate as patients Cr has been improving. Patient's renal failure and heart failure/plm edema and respiratory arrest may have all been due to malignant HTN, but primary renal disease has not been ruled out and remain suspicious of same. As blood pressure is controlled for a period of time, proteinuria may resolve. Concern for autoimmune process but workup negative to date.   Although She has proteinuria, she is not nephrotic at least by VWU:JWJXB ratio which is 1.05 Can consider renal biopsy when stable if persistent proteinuria, evidence of nephrotic proteinuria or active sediment, or failure  to return to normal renal function in setting of controlled HTN. If Cr continues to improve, but would likely favor outpatient renal bx.  Now on po lasix for her edema, foley back in, will continue to monitor strict in's and out's and daily labs  2) ID- UA sent and + for protein and leuks.  Sent for culture, will monitor. Blood cultures obtained too.  UA only with yeast.  3) Abdominal pain- pt is still having diarrhea, c- diff is negative and pain improving.   4) Pulm - ? PNA vs non  cardiogenic pulmonary edema- CXR with some worsening left base atelectasis; pulm edema has resolved; WBC is increasing; ? Aspiration - per primary service   5) Neuro- Now on Klonopin and Depakote for seizures/myoclonus; neuro following   6) Heme- hemoglobinstable since transfusion on 03/29/11.  No thrombocytopenia.   7) CV- CCM is adjusting BP meds  8) Disposition- PT recs in patient rehab prior to going home, seems very appropriate in this patient.   Yazmyne Sara 04/09/2011 7:29 AM

## 2011-04-09 NOTE — Progress Notes (Signed)
OT Note:  Attempted x 2 to perform OT eval.  Pt with PA and then RN upon first attempt.  Pt sleeping upon second with mother at bedside who stated pt had just fallen asleep.  Will attempt again. 04/09/2011 Martie Round, OTR/L Pager: [phone]

## 2011-04-09 NOTE — Progress Notes (Signed)
Occupational Therapy Evaluation Patient Details Name: Heather Sims MRN: [id number] DOB: [birth year] Today's Date: 04/09/2011  Problem List:  Patient Active Problem List  Diagnoses  . AKI (acute kidney injury)  . Acute heart failure  . Hypoxemia  . Pneumonia  . Anemia  . Hypertension  . ? Viral Cardiomyopathy    Past Medical History:  Past Medical History  Diagnosis Date  . Pneumonia last 2 weeks    'walking pneumonia'   Past Surgical History: No past surgical history on file.  OT Assessment/Plan/Recommendation OT Assessment Clinical Impression Statement: Pt is a 21 year old woman who was admitted in respiratory failure and cardiac arrest with resulting hypoxemia.  She also sustained acute renal injury.  Pt was independent in all ADL and mobility PTA.  Currently, she is dependent in all aspects requiring max to total assist with ADL and bed mobility.  Pt presents with weakness, particularly proximally, with UE dystonia greater on the R than L and myclonus in LEs.  Will follow pt acutely.  Recommend inpatient rehab for intense therapy. OT Recommendation/Assessment: Patient will need skilled OT in the acute care venue OT Problem List: Decreased activity tolerance;Impaired balance (sitting and/or standing);Decreased strength;Impaired tone;Impaired UE functional use;Decreased knowledge of use of DME or AE;Decreased cognition OT Therapy Diagnosis : Generalized weakness;Cognitive deficits OT Plan OT Frequency: Min 2X/week OT Treatment/Interventions: Self-care/ADL training;Therapeutic activities;Patient/family education;Cognitive remediation/compensation;Neuromuscular education;DME and/or AE instruction OT Recommendation Recommendations for Other Services: Rehab consult Follow Up Recommendations: Inpatient Rehab Equipment Recommended: Defer to next venue Individuals Consulted Consulted and Agree with Results and Recommendations: Patient;Family member/caregiver Family Member Consulted:  mother OT Goals Acute Rehab OT Goals OT Goal Formulation: With patient Time For Goal Achievement: 2 weeks ADL Goals Pt Will Perform Eating: with set-up;Supine, head of bed up;Sitting, chair;with adaptive utensils ADL Goal: Eating - Progress: Goal set today Pt Will Perform Grooming: with set-up;with adaptive equipment;Supine, head of bed up;Sitting, chair ADL Goal: Grooming - Progress: Goal set today Pt Will Perform Upper Body Bathing: with min assist;Sitting, chair ADL Goal: Upper Body Bathing - Progress: Goal set today Pt Will Perform Upper Body Dressing: with min assist;Sitting, chair ADL Goal: Upper Body Dressing - Progress: Goal set today Miscellaneous OT Goals Miscellaneous OT Goal #1: Pt will sit EOB with supervision x 8 minutes in preparation for ADL. OT Goal: Miscellaneous Goal #1 - Progress: Goal set today Miscellaneous OT Goal #2: Pt will perform UE strengthening exercises/activities with supervision to increase proximal strength and stability. OT Goal: Miscellaneous Goal #2 - Progress: Goal set today Miscellaneous OT Goal #3: Pt will recall education provided in previous treatment session using compensatory strategies. OT Goal: Miscellaneous Goal #3 - Progress: Goal set today  OT Evaluation Precautions/Restrictions  Precautions Precautions: Fall Restrictions Weight Bearing Restrictions: No Prior Functioning Home Living Lives With: Family Receives Help From: Family Type of Home: House Home Layout: One level Home Access: Stairs to enter Entrance Stairs-Rails: None Entrance Stairs-Number of Steps: 2 Bathroom Shower/Tub: Engineer, manufacturing systems: Standard Bathroom Accessibility: Yes How Accessible: Accessible via wheelchair Home Adaptive Equipment: None Prior Function Level of Independence: Independent with basic ADLs;Independent with gait;Independent with transfers Driving: Yes Vocation: Student ADL ADL Eating/Feeding: Performed;Maximal assistance Where  Assessed - Eating/Feeding: Bed level Grooming: Performed;Wash/dry face;Moderate assistance Where Assessed - Grooming: Supine, head of bed up Upper Body Bathing: Simulated;+1 Total assistance Where Assessed - Upper Body Bathing: Sitting, bed Lower Body Bathing: Simulated;+1 Total assistance Where Assessed - Lower Body Bathing: Sitting, bed Upper Body Dressing: Simulated;Maximal assistance  Where Assessed - Upper Body Dressing: Sitting, bed Lower Body Dressing: Simulated;+1 Total assistance Where Assessed - Lower Body Dressing: Sitting, bed Ambulation Related to ADLs: Pt is non ambulatory at this point. 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, decreased proximal strength, and myoclonus of LEs when seated  Vision/Perception  Vision - History Baseline Vision: No visual deficits Patient Visual Report: No change from baseline Praxis Praxis: Not tested Cognition Cognition Arousal/Alertness: Lethargic Overall Cognitive Status: Impaired Attention: Impaired Current Attention Level: Sustained Memory: Appears impaired Memory Deficits: confuses events of the past several days Orientation Level: Oriented X4 Sensation/Coordination Sensation Light Touch: Appears Intact Hot/Cold: Appears Intact Coordination Gross Motor Movements are Fluid and Coordinated: No Fine Motor Movements are Fluid and Coordinated: No Coordination and Movement Description: myoclonus with movement over entire body, trouble initiating movement and then controlling movements velocity and direction after initiation  Extremity Assessment RUE Assessment RUE Assessment: Exceptions to [facility] (3+/5 shoulder, 4-/5 elbow, wrist, gross grasp) RUE Strength Right Shoulder Flexion: 3+/5 Right Shoulder Extension: 3+/5 LUE Assessment LUE Assessment: Exceptions to [facility] (3+/5 shoulder, 4-/5 elbow, wrist, gross grasp) Mobility  Bed Mobility Bed Mobility: Yes Rolling Left: 3: Mod assist Supine to Sit: 2: Max  assist Sitting - Scoot to Edge of Bed: 3: Mod assist Transfers Transfers: No (did not perform OOB in absence of second person) End of Session OT - End of Session Equipment Utilized During Treatment: Other (comment) (issued weighted mug, foam build ups for eating utensils) Activity Tolerance: Patient limited by fatigue Patient left: in bed;with family/visitor present Nurse Communication: Other (comment) (pts performance) General Behavior During Session: Lethargic Cognition: WFL for tasks performed   [name] 04/09/2011, 2:40 PM [phone]

## 2011-04-09 NOTE — Progress Notes (Signed)
Speech Language/Pathology Speech Language Pathology Evaluation Patient Details Name: Heather Sims MRN: [id number] DOB: [birth year] Today's Date: 04/09/2011  Problem List:  Patient Active Problem List  Diagnoses  . AKI (acute kidney injury)  . Acute heart failure  . Hypoxemia  . Pneumonia  . Anemia  . Hypertension  . ? Viral Cardiomyopathy   Past Medical History:  Past Medical History  Diagnosis Date  . Pneumonia last 2 weeks    'walking pneumonia'   Past Surgical History: No past surgical history on file.  SLP Assessment/Plan/Recommendation  Clinical Impression Statement: Patient presents with moderate cognitive-linguistic impairements effecting attention, short term memory, awareness, reasoning and problem solving skills s/p hypoxic injury following cardiac arrest. Patient will benefit from skilled SLP services to facilitate improved cognitive skills for return home with mom.   Speech Therapy Frequency: min 2x/week Duration: 2 weeks  Follow up Recommendations: Inpatient Rehab  SLP Goals  SLP Goals Potential to Achieve Goals: Good Progress/Goals/Alternative treatment plan discussed with pt/caregiver and they: Agree SLP Goal #1: Patient will sustain attention to moderately complex ADL with supervision cues to redirect or compensate SLP Goal #1 - Progress: Not met SLP Goal #2: Patient will utilize compensatory strategies/external memory aids to faciliate recall of daily information with min assist SLP Goal #2 - Progress: Not met SLP Goal #3: Patietn will demonstate intellectual awareness of both physical and mental deficits during functional ADL with min assist.  SLP Goal #3 - Progress: Not met  Ferdinand Lango MA, CCC-SLP [phone]  Heather Sims [name] 04/09/2011, 11:27 AM

## 2011-04-09 NOTE — Progress Notes (Signed)
Subjective: Patient awake following command. She denies abdominal pain. Her diet was advanced today to regular diet. Myoclonus  has decreased. Objective: Filed Vitals:   04/09/11 0906 04/09/11 1029 04/09/11 1326 04/09/11 1400  BP: 140/90 132/78 114/67 116/71  Pulse: 80 66 69 72  Temp:    98.9 F (37.2 C)  TempSrc:      Resp:      Height:      Weight:      SpO2:    97%   Weight change:   Intake/Output Summary (Last 24 hours) at 04/09/11 1646 Last data filed at 04/09/11 0600  Gross per 24 hour  Intake    240 ml  Output    450 ml  Net   -210 ml    General: Alert, awake, oriented x3, in no acute distress.  HEENT: No bruits, no goiter.  Heart: Regular rate and rhythm, without murmurs, rubs, gallops.  Lungs: CTA, bilateral air movement.  Abdomen: Soft, nontender, nondistended, positive bowel sounds.  Neuro: Grossly intact, nonfocal.   Lab Results:  Basename 04/09/11 0837 04/08/11 0550 04/07/11 0500  NA 137 139 --  K 3.2* 3.2* --  CL 105 106 --  CO2 21 21 --  GLUCOSE 91 91 --  BUN 81* 83* --  CREATININE 2.18* 2.31* --  CALCIUM 7.9* 7.9* --  MG -- -- 2.5  PHOS -- -- 6.6*    Basename 04/08/11 0550 04/07/11 0500  WBC 14.7* 18.6*  NEUTROABS -- --  HGB 10.5* 10.7*  HCT 31.7* 32.5*  MCV 89.3 88.8  PLT 279 248    Basename 04/07/11 0500  TSH 3.003  T4TOTAL --  T3FREE --  THYROIDAB --    Micro Results: Recent Results (from the past 240 hour(s))  URINE CULTURE     Status: Normal   Collection Time   04/02/11 11:14 AM      Component Value Range Status Comment   Specimen Description URINE, CATHETERIZED   Final    Special Requests NONE   Final    Setup Time [id number]   Final    Colony Count NO GROWTH   Final    Culture NO GROWTH   Final    Report Status 04/03/2011 FINAL   Final   CULTURE, BLOOD (ROUTINE X 2)     Status: Normal   Collection Time   04/02/11 12:00 PM      Component Value Range Status Comment   Specimen Description BLOOD RIGHT ANTECUBITAL    Final    Special Requests BOTTLES DRAWN AEROBIC AND ANAEROBIC 10CC   Final    Setup Time [id number]   Final    Culture NO GROWTH 5 DAYS   Final    Report Status 04/08/2011 FINAL   Final   CULTURE, BLOOD (ROUTINE X 2)     Status: Normal   Collection Time   04/02/11 12:15 PM      Component Value Range Status Comment   Specimen Description BLOOD RIGHT HAND   Final    Special Requests BOTTLES DRAWN AEROBIC ONLY 10CC   Final    Setup Time [id number]   Final    Culture NO GROWTH 5 DAYS   Final    Report Status 04/08/2011 FINAL   Final   URINE CULTURE     Status: Normal   Collection Time   04/06/11  5:30 AM      Component Value Range Status Comment   Specimen Description URINE, CATHETERIZED   Final    Special  Requests NONE   Final    Setup Time [id number]   Final    Colony Count 80,000 COLONIES/ML   Final    Culture YEAST   Final    Report Status 04/07/2011 FINAL   Final   CLOSTRIDIUM DIFFICILE BY PCR     Status: Normal   Collection Time   04/06/11  5:40 AM      Component Value Range Status Comment   C difficile by pcr NEGATIVE  NEGATIVE  Final   CULTURE, BLOOD (ROUTINE X 2)     Status: Normal (Preliminary result)   Collection Time   04/06/11  9:47 AM      Component Value Range Status Comment   Specimen Description BLOOD RIGHT ANTECUBITAL   Final    Special Requests BOTTLES DRAWN AEROBIC AND ANAEROBIC 10CC   Final    Setup Time [id number]   Final    Culture     Final    Value:        BLOOD CULTURE RECEIVED NO GROWTH TO DATE CULTURE WILL BE HELD FOR 5 DAYS BEFORE ISSUING A FINAL NEGATIVE REPORT   Report Status PENDING   Incomplete   CULTURE, BLOOD (ROUTINE X 2)     Status: Normal (Preliminary result)   Collection Time   04/06/11 10:00 AM      Component Value Range Status Comment   Specimen Description BLOOD RIGHT HAND   Final    Special Requests BOTTLES DRAWN AEROBIC AND ANAEROBIC 10CC   Final    Setup Time [id number]   Final    Culture     Final    Value:        BLOOD  CULTURE RECEIVED NO GROWTH TO DATE CULTURE WILL BE HELD FOR 5 DAYS BEFORE ISSUING A FINAL NEGATIVE REPORT   Report Status PENDING   Incomplete     Studies/Results: Dg Abd Portable 1v  04/08/2011  *RADIOLOGY REPORT*  Clinical Data: Abdominal pain  PORTABLE ABDOMEN - 1 VIEW  Comparison: 04/05/2011  Findings: Gas in non dilated stomach.  Gas in nondilated transverse colon.  Gas is present in the rectum.  Negative for bowel obstruction.  No acute bony abnormality.  IMPRESSION: Nonobstructive bowel gas pattern.  Original Report Authenticated By: Camelia Phenes, M.D.    Medications: I have reviewed the patient's current medications.  21 year old female with cardiac arrest due to respiratory arrest, who has significant cardiomyopathy and HTN, with acute renal failure, now extubated 1/10, and recovering renal function (unknown baseline creatinine). Illness preceded by edema and leg/abd swelling for a month or 2 PTA)  1. VDRF - post arrest, resolved.  2. Cardiac - Cardiomyopathy with reduced systolic function, EF 40-45% (Echo 03/27/2011). Suspect HTN-cardiomyopathy. Was flu negative, ? 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  - Renal artery stenosis U/S negative on 1/14  3. Renal: AKI,  HIV Neg  ANA Neg, DSDNA ab 22 (WNL) ASO 87 (WNL)  Diract Antiglobulin complement and IgG Neg  Total complement high, C3, C4 Normal  PTH 250.2 (H)  Kappa Light Chains 8.6 (H) Lamda Light Chains 5.8(H), Ratio 1.46 (WNL)  ANCA Negative   Continue with lasix. Renal following.  For renal biopsy 1-18.   4. ID - : WBC trending down.  Received treatment for PNA.  Will follow urine culture. Yeast urine.  Antibiotics:  03/26/11 vanc >>1/5  03/26/11 ceftriaxone >> 04/02/11  03/26/11 moxi >>1/5  03/26/11 tamiflu >>03/27/11   5. Neuro - seizures,  myoclonus. Also with severe B LE weakness.   (Depacon 1000 mg q12 h)   Phenergan stopped. Myoclonus improved on Clonazepam .  6. Heme Anemia (03/26/2011).    -Continue to monitor. No indication for PRBCs presently  7. Diet:  Diet change to regular.  8-Hypertension:  Continue with hydralazine, clonidine, metoprolol. BP improved on low dose  Norvasc.  Hypokalemia: replaced.  Abdominal pain, diarrhea: c diff negative. KUB negative. Abdominal pain resolved.         LOS: 14 days   Janvi Ammar M.D.  [facility] 04/09/2011, 4:46 PM

## 2011-04-10 ENCOUNTER — Inpatient Hospital Stay (HOSPITAL_COMMUNITY): Payer: Medicaid Other

## 2011-04-10 LAB — TYPE AND SCREEN: ABO/RH(D): O POS

## 2011-04-10 LAB — CBC
HCT: 27.2 % — ABNORMAL LOW (ref 36.0–46.0)
HCT: 27.4 % — ABNORMAL LOW (ref 36.0–46.0)
Hemoglobin: 9.1 g/dL — ABNORMAL LOW (ref 12.0–15.0)
Hemoglobin: 8.7 g/dL — ABNORMAL LOW (ref 12.0–15.0)
MCHC: 33.5 g/dL (ref 30.0–36.0)
MCV: 87.2 fL (ref 78.0–100.0)
MCV: 87.8 fL (ref 78.0–100.0)
Platelets: 271 10*3/uL (ref 150–400)
Platelets: 294 10*3/uL (ref 150–400)
RBC: 2.98 MIL/uL — ABNORMAL LOW (ref 3.87–5.11)
RBC: 3.12 MIL/uL — ABNORMAL LOW (ref 3.87–5.11)
WBC: 10.9 10*3/uL — ABNORMAL HIGH (ref 4.0–10.5)
WBC: 9.9 10*3/uL (ref 4.0–10.5)
WBC: 10.2 10*3/uL (ref 4.0–10.5)

## 2011-04-10 LAB — BASIC METABOLIC PANEL
BUN: 84 mg/dL — ABNORMAL HIGH (ref 6–23)
Chloride: 106 mEq/L (ref 96–112)
GFR calc Af Amer: 34 mL/min — ABNORMAL LOW (ref >90)
GFR calc non Af Amer: 29 mL/min — ABNORMAL LOW (ref >90)
Potassium: 4 mEq/L (ref 3.5–5.1)
Sodium: 138 mEq/L (ref 135–145)

## 2011-04-10 MED ORDER — MIDAZOLAM HCL 5 MG/5ML IJ SOLN
INTRAMUSCULAR | Status: AC | PRN
  Administered 2011-04-10: 1 mg via INTRAVENOUS

## 2011-04-10 MED ORDER — MIDAZOLAM HCL 2 MG/2ML IJ SOLN
INTRAMUSCULAR | Status: AC
  Filled 2011-04-10: qty 4

## 2011-04-10 MED ORDER — FENTANYL CITRATE 0.05 MG/ML IJ SOLN
INTRAMUSCULAR | Status: AC
  Filled 2011-04-10: qty 4

## 2011-04-10 MED ORDER — FENTANYL CITRATE 0.05 MG/ML IJ SOLN
INTRAMUSCULAR | Status: AC | PRN
  Administered 2011-04-10 (×2): 25 ug via INTRAVENOUS

## 2011-04-10 NOTE — Progress Notes (Signed)
  I have seen and examined this patient and agree with the assessment/plan as outlined above by Ashley Royalty DO ([id number]).Patient is S/P renal biopsy today and had some subcapsular bleeding which seemed stopped and stable post-procedure; follow up hemoglobin slightly lower and will continue to trend this out as we follow trends/ BP status. Renal function unchanged. Jaydrian Corpening K.,MD 04/10/2011 2:41 PM

## 2011-04-10 NOTE — Progress Notes (Signed)
Renal Daily Progress Note  Subjective: No complaints this am Objective:  Vital signs in last 24 hours:  Temp:  [98.6 F (37 C)-98.9 F (37.2 C)] 98.6 F (37 C) (01/17 2100) Pulse Rate:  [66-83] 83  (01/17 2100) Resp:  [17] 17  (01/17 2100) BP: (114-145)/(67-90) 145/76 mmHg (01/17 2100) SpO2:  [96 %-97 %] 96 % (01/17 2100) Weight change:   Intake/Output: I/O last 3 completed shifts: In: 530 [I.V.:480; IV Piggyback:50] Out: 450 [Urine:450]  EXAM: Slow to speak but very appropriate, oriented to person and place.  CVS: RRR, 2/6 systolic murmur RS- Grossly clear lungs ABD: +BS, soft,  non-tender to palpation EXT: trace edema Neuro: awake and alert,continued myoclonus in legs and feet.   Lab Results:  Basename 04/10/11 0610 04/08/11 0550  WBC 10.9* 14.7*  HGB 9.1* 10.5*  HCT 27.2* 31.7*  PLT 270 279   BMET  Basename 04/09/11 0837 04/08/11 0550  NA 137 139  K 3.2* 3.2*  CL 105 106  CO2 21 21  GLUCOSE 91 91  BUN 81* 83*  CREATININE 2.18* 2.31*  CALCIUM 7.9* 7.9*  PHOS -- --   LFT No results found for this basename: PROT,ALBUMIN,AST,ALT,ALKPHOS,BILITOT,BILIDIR,IBILI in the last 72 hours PTH: Lab Results  Component Value Date   PTH 250.2* 03/28/2011   CALCIUM 7.9* 04/09/2011   CAION 1.09* 03/26/2011   PHOS 6.6* 04/07/2011  Renal and Serologic Work up: (negative to date except for proteinuria)  HIV Neg  ANA Neg, DSDNA ab 22 (WNL) ASO 87 (WNL)  Diract Antiglobulin complement and IgG Neg  Total complement high, C3, C4 Normal  PTH 250.2 (H)  Kappa Light Chains 8.6 (H) Lamda Light Chains 5.8(H), Ratio 1.46 (WNL)  ANCA Negative  Urine ZOX:WRUEA 870 mg/gm creat 04/04/11; last UA >300 pro, 7-10 RBC (vs TNTC RBC on initial UA)  SPEP:  Total Protien 3.5 (L)  Albumin 45.1 (L)  Alpha 1 globulin 10.8 (H)  Alpha 2 globulin 19.2 (H)  Beta Globulin 5.8  Beta 2 5.1  Gamma Globulin 14.0  M Spike not detected  UPEP:  Tpro 33.4  Albumin Detected  Alpha 1 Detected  Alpha 2  Detected  Beta Detected  Gamma globulin Detected  Cryoglobulin REPORT Comments: (NOTE) Cryoglobulin, QL Analysis None Detected  Pending:  Glomerular Basement membrane   Studies/Results: Renal Artery Doppler (prelim): No significant RAS, bilateral hyperechoic appearance of kidneys.    Assessment/Plan: 21 year old female with cardiac arrest due to respiratory arrest, who has significant cardiomyopathy and HTN, with presumed acute renal failure, now extubated, and recovering renal function (unknown baseline creatinine; 2.31 on admission; nadir 1.95; peak 4.86) Illness preceded by edema and leg/abd swelling for a month or 2 PTA)  1) Renal Failure- Cr pending this morning, UOP not charted overnight. Patient's renal failure and heart failure/plm edema and respiratory arrest may have all been due to malignant HTN, but primary renal disease has not been ruled out and remain suspicious of same. As blood pressure is controlled for a period of time, proteinuria may resolve. Concern for autoimmune process, C3 low, however DSDNA and ANA negative.   Although She has proteinuria, she is not nephrotic at least by VWU:JWJXB ratio which is 1.05.  Plan for renal bx today.  Continueo on po lasix for her edema, d/c foley today, will continue to monitor strict in's and out's and daily labs  2) ID- UA sent and + for protein and leuks.  Blood cultures obtained too.  UA only with yeast.  3) Abdominal pain- pt is still having diarrhea, c- diff is negative and pain improving.   4) Pulm - ? PNA vs non cardiogenic pulmonary edema- CXR with some worsening left base atelectasis; pulm edema has resolved; WBC is increasing; ? Aspiration - per primary service   5) Neuro- Now on Klonopin and Depakote for seizures/myoclonus; neuro following   6) Heme- Mild drop in hemoglobin.  No thrombocytopenia.   7) CV- BP meds per primary  8) Disposition- PT recs inpatient rehab prior to going home, seems very appropriate in this  patient.   Heather Sims 04/10/2011 6:59 AM

## 2011-04-10 NOTE — Procedures (Signed)
Post-Procedure Note  Pre-operative Diagnosis: Proteinuria       Post-operative Diagnosis: same   Indications: Proteinuria  Procedure Details:   Ultrasound guided biopsy of right kidney.  3 cores were obtained because the first core was inadequate.    Findings: Fullness of the collecting systems, left side greater than right.  Core samples obtained from right lower pole.  After the third core, there was an immediate perinephric hematoma.  Hematoma was watched for approximately 10 minutes.  The hematoma and active bleeding stopped on its own.    Complications: Perinephric hematoma.     Condition: Stable  Plan: Monitor in Radiology for one hour prior to returning to floor.  Bedrest all day and follow CBC.

## 2011-04-10 NOTE — Progress Notes (Signed)
04/10/2011  Downing Bing, PT [phone] (pager)

## 2011-04-10 NOTE — Progress Notes (Signed)
CSW has addresses all identified social work needs and signing off at this time.  Baxter Flattery, MSW [phone]

## 2011-04-10 NOTE — Progress Notes (Signed)
Subjective: Patient lying down in bed in not distress, came from IR department after kidney biopsy. No complaints.  Objective: Filed Vitals:   04/10/11 1130 04/10/11 1146 04/10/11 1200 04/10/11 1300  BP: 142/81 139/84 141/81 149/88  Pulse: 82 82 82 83  Temp:    98.2 F (36.8 C)  TempSrc:      Resp:  24    Height:      Weight:      SpO2: 95% 94% 95% 97%   Weight change: 2.586 kg (5 lb 11.2 oz)  Intake/Output Summary (Last 24 hours) at 04/10/11 1420 Last data filed at 04/10/11 1300  Gross per 24 hour  Intake    240 ml  Output   1802 ml  Net  -1562 ml    General: Alert, awake, oriented x3, in no acute distress.  HEENT: No bruits, no goiter.  Heart: Regular rate and rhythm, without murmurs, rubs, gallops.  Lungs: CTA, bilateral air movement.  Abdomen: Soft, nontender, nondistended, positive bowel sounds.  Neuro: Grossly intact, nonfocal. Extremities: no edema.   Lab Results:  [facility] 04/10/11 0610 04/09/11 0837  NA 138 137  K 4.0 3.2*  CL 106 105  CO2 21 21  GLUCOSE 93 91  BUN 84* 81*  CREATININE 2.31* 2.18*  CALCIUM 7.9* 7.9*  MG -- --  PHOS -- --   Basename 04/10/11 1145 04/10/11 0610  WBC 9.9 10.9*  NEUTROABS -- --  HGB 8.7* 9.1*  HCT 26.0* 27.2*  MCV 87.2 86.9  PLT 271 270    Micro Results: Recent Results (from the past 240 hour(s))  URINE CULTURE     Status: Normal   Collection Time   04/02/11 11:14 AM      Component Value Range Status Comment   Specimen Description URINE, CATHETERIZED   Final    Special Requests NONE   Final    Setup Time [id number]   Final    Colony Count NO GROWTH   Final    Culture NO GROWTH   Final    Report Status 04/03/2011 FINAL   Final   CULTURE, BLOOD (ROUTINE X 2)     Status: Normal   Collection Time   04/02/11 12:00 PM      Component Value Range Status Comment   Specimen Description BLOOD RIGHT ANTECUBITAL   Final    Special Requests BOTTLES DRAWN AEROBIC AND ANAEROBIC 10CC   Final    Setup Time [id number]    Final    Culture NO GROWTH 5 DAYS   Final    Report Status 04/08/2011 FINAL   Final   CULTURE, BLOOD (ROUTINE X 2)     Status: Normal   Collection Time   04/02/11 12:15 PM      Component Value Range Status Comment   Specimen Description BLOOD RIGHT HAND   Final    Special Requests BOTTLES DRAWN AEROBIC ONLY 10CC   Final    Setup Time [id number]   Final    Culture NO GROWTH 5 DAYS   Final    Report Status 04/08/2011 FINAL   Final   URINE CULTURE     Status: Normal   Collection Time   04/06/11  5:30 AM      Component Value Range Status Comment   Specimen Description URINE, CATHETERIZED   Final    Special Requests NONE   Final    Setup Time [id number]   Final    Colony Count 80,000 COLONIES/ML   Final  Culture YEAST   Final    Report Status 04/07/2011 FINAL   Final   CLOSTRIDIUM DIFFICILE BY PCR     Status: Normal   Collection Time   04/06/11  5:40 AM      Component Value Range Status Comment   C difficile by pcr NEGATIVE  NEGATIVE  Final   CULTURE, BLOOD (ROUTINE X 2)     Status: Normal (Preliminary result)   Collection Time   04/06/11  9:47 AM      Component Value Range Status Comment   Specimen Description BLOOD RIGHT ANTECUBITAL   Final    Special Requests BOTTLES DRAWN AEROBIC AND ANAEROBIC 10CC   Final    Setup Time [id number]   Final    Culture     Final    Value:        BLOOD CULTURE RECEIVED NO GROWTH TO DATE CULTURE WILL BE HELD FOR 5 DAYS BEFORE ISSUING A FINAL NEGATIVE REPORT   Report Status PENDING   Incomplete   CULTURE, BLOOD (ROUTINE X 2)     Status: Normal (Preliminary result)   Collection Time   04/06/11 10:00 AM      Component Value Range Status Comment   Specimen Description BLOOD RIGHT HAND   Final    Special Requests BOTTLES DRAWN AEROBIC AND ANAEROBIC 10CC   Final    Setup Time [id number]   Final    Culture     Final    Value:        BLOOD CULTURE RECEIVED NO GROWTH TO DATE CULTURE WILL BE HELD FOR 5 DAYS BEFORE ISSUING A FINAL NEGATIVE REPORT    Report Status PENDING   Incomplete     Studies/Results: Dg Abd Portable 1v  04/08/2011  *RADIOLOGY REPORT*  Clinical Data: Abdominal pain  PORTABLE ABDOMEN - 1 VIEW  Comparison: 04/05/2011  Findings: Gas in non dilated stomach.  Gas in nondilated transverse colon.  Gas is present in the rectum.  Negative for bowel obstruction.  No acute bony abnormality.  IMPRESSION: Nonobstructive bowel gas pattern.  Original Report Authenticated By: Camelia Phenes, M.D.    Medications: I have reviewed the patient's current medications.   21 year old female with cardiac arrest due to respiratory arrest, who has significant cardiomyopathy and HTN, with acute renal failure, now extubated 1/10, and recovering renal function (unknown baseline creatinine). Illness preceded by edema and leg/abd swelling for a month or 2 PTA)   1. VDRF - post arrest, resolved.   2. Cardiac - Cardiomyopathy with reduced systolic function, EF 40-45% (Echo 03/27/2011). Suspect HTN-cardiomyopathy. Was flu negative, ? 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  - Renal artery stenosis U/S negative on 1/14   3. Renal: AKI,  HIV Neg  ANA Neg, DSDNA ab 22 (WNL) ASO 87 (WNL)  Diract Antiglobulin complement and IgG Neg  Total complement high, C3, C4 Normal  PTH 250.2 (H)  Kappa Light Chains 8.6 (H) Lamda Light Chains 5.8(H), Ratio 1.46 (WNL)  ANCA Negative  Continue with lasix. Renal following.   renal biopsy 1-18.   4. ID - : WBC trending down.  Received treatment for PNA.  Will follow urine culture. Yeast urine.  Antibiotics:  03/26/11 vanc >>1/5  03/26/11 ceftriaxone >> 04/02/11  03/26/11 moxi >>1/5  03/26/11 tamiflu >>03/27/11   5. Neuro - seizures, myoclonus. Also with severe B LE weakness.  (Depacon 1000 mg q12 h)  Phenergan stopped. Myoclonus improved on Clonazepam .   6.  Heme Anemia (03/26/2011).  -Continue to monitor. No indication for PRBCs presently  7. Diet:  Diet changed to regular.  8-Hypertension:   Continue with hydralazine, clonidine, metoprolol. BP improved on low dose Norvasc.  9-Hypokalemia: replaced.  10-Abdominal pain, diarrhea: c diff negative. KUB negative. Abdominal pain resolved. 11-Perinephric Hematoma: Repeat Hb later today. Transfuse as needed. Per radiology report active bleeding stopped.     LOS: 15 days   Andree Golphin M.D.  [facility] 04/10/2011, 2:20 PM

## 2011-04-10 NOTE — Progress Notes (Signed)
Rehab admissions - Continuing to follow.  Noted patient with hematoma after renal biopsy today.  On bedrest for today.  I will hold off on rehab admit for today and reconsider for inpatient rehab on Monday.  Pager [phone]

## 2011-04-10 NOTE — ED Notes (Signed)
Report called to [facility] pt nurse. Pt stable. No complaints dressing dry intact. Transported with nurse back to floor

## 2011-04-10 NOTE — Progress Notes (Signed)
PT Cancellation Note  Treatment cancelled today due to medical issues with patient which prohibited therapy. Pt on bed-rest today.   Heather Sims 04/10/2011, 1:31 PM

## 2011-04-10 NOTE — Progress Notes (Signed)
Utilization review completed.  

## 2011-04-11 LAB — CBC
Hemoglobin: 9.1 g/dL — ABNORMAL LOW (ref 12.0–15.0)
MCH: 29.4 pg (ref 26.0–34.0)
Platelets: 303 10*3/uL (ref 150–400)
RBC: 3.1 MIL/uL — ABNORMAL LOW (ref 3.87–5.11)
WBC: 10.7 10*3/uL — ABNORMAL HIGH (ref 4.0–10.5)

## 2011-04-11 LAB — RENAL FUNCTION PANEL
CO2: 24 mEq/L (ref 19–32)
Chloride: 107 mEq/L (ref 96–112)
Creatinine, Ser: 2.01 mg/dL — ABNORMAL HIGH (ref 0.50–1.10)
GFR calc non Af Amer: 34 mL/min — ABNORMAL LOW (ref >90)
Potassium: 3.8 mEq/L (ref 3.5–5.1)

## 2011-04-11 NOTE — Progress Notes (Signed)
Subjective: Lying in bed, no distress, feeling ok. No complaints. Denies abdominal pain.  Objective: Filed Vitals:   04/11/11 0630 04/11/11 0631 04/11/11 0632 04/11/11 0635  BP: 90/53 118/72 115/74 120/83  Pulse: 54 55 71 60  Temp:      TempSrc:      Resp:      Height:      Weight:      SpO2:       Weight change:   Intake/Output Summary (Last 24 hours) at 04/11/11 1227 Last data filed at 04/11/11 0130  Gross per 24 hour  Intake    790 ml  Output   1676 ml  Net   -886 ml    General: Alert, awake, oriented x3, in no acute distress.  HEENT: No bruits, no goiter.  Heart: Regular rate and rhythm, without murmurs, rubs, gallops.  Lungs: CTA, bilateral air movement.  Abdomen: Soft, nontender, nondistended, positive bowel sounds.  Extremities no edema.   Lab Results:  Basename 04/11/11 0500 04/10/11 0610  NA 141 138  K 3.8 4.0  CL 107 106  CO2 24 21  GLUCOSE 109* 93  BUN 77* 84*  CREATININE 2.01* 2.31*  CALCIUM 8.0* 7.9*  MG -- --  PHOS 4.8* --    Basename 04/11/11 0500  AST --  ALT --  ALKPHOS --  BILITOT --  PROT --  ALBUMIN 1.7*   No results found for this basename: LIPASE:2,AMYLASE:2 in the last 72 hours  Basename 04/11/11 0500 04/10/11 1932  WBC 10.7* 10.2  NEUTROABS -- --  HGB 9.1* 9.3*  HCT 27.3* 27.4*  MCV 88.1 87.8  PLT 303 294    Micro Results: Recent Results (from the past 240 hour(s))  URINE CULTURE     Status: Normal   Collection Time   04/02/11 11:14 AM      Component Value Range Status Comment   Specimen Description URINE, CATHETERIZED   Final    Special Requests NONE   Final    Setup Time [id number]   Final    Colony Count NO GROWTH   Final    Culture NO GROWTH   Final    Report Status 04/03/2011 FINAL   Final   CULTURE, BLOOD (ROUTINE X 2)     Status: Normal   Collection Time   04/02/11 12:00 PM      Component Value Range Status Comment   Specimen Description BLOOD RIGHT ANTECUBITAL   Final    Special Requests BOTTLES DRAWN  AEROBIC AND ANAEROBIC 10CC   Final    Setup Time [id number]   Final    Culture NO GROWTH 5 DAYS   Final    Report Status 04/08/2011 FINAL   Final   CULTURE, BLOOD (ROUTINE X 2)     Status: Normal   Collection Time   04/02/11 12:15 PM      Component Value Range Status Comment   Specimen Description BLOOD RIGHT HAND   Final    Special Requests BOTTLES DRAWN AEROBIC ONLY 10CC   Final    Setup Time [id number]   Final    Culture NO GROWTH 5 DAYS   Final    Report Status 04/08/2011 FINAL   Final   URINE CULTURE     Status: Normal   Collection Time   04/06/11  5:30 AM      Component Value Range Status Comment   Specimen Description URINE, CATHETERIZED   Final    Special Requests NONE   Final  Setup Time [id number]   Final    Colony Count 80,000 COLONIES/ML   Final    Culture YEAST   Final    Report Status 04/07/2011 FINAL   Final   CLOSTRIDIUM DIFFICILE BY PCR     Status: Normal   Collection Time   04/06/11  5:40 AM      Component Value Range Status Comment   C difficile by pcr NEGATIVE  NEGATIVE  Final   CULTURE, BLOOD (ROUTINE X 2)     Status: Normal (Preliminary result)   Collection Time   04/06/11  9:47 AM      Component Value Range Status Comment   Specimen Description BLOOD RIGHT ANTECUBITAL   Final    Special Requests BOTTLES DRAWN AEROBIC AND ANAEROBIC 10CC   Final    Setup Time [id number]   Final    Culture     Final    Value:        BLOOD CULTURE RECEIVED NO GROWTH TO DATE CULTURE WILL BE HELD FOR 5 DAYS BEFORE ISSUING A FINAL NEGATIVE REPORT   Report Status PENDING   Incomplete   CULTURE, BLOOD (ROUTINE X 2)     Status: Normal (Preliminary result)   Collection Time   04/06/11 10:00 AM      Component Value Range Status Comment   Specimen Description BLOOD RIGHT HAND   Final    Special Requests BOTTLES DRAWN AEROBIC AND ANAEROBIC 10CC   Final    Setup Time [id number]   Final    Culture     Final    Value:        BLOOD CULTURE RECEIVED NO GROWTH TO DATE CULTURE  WILL BE HELD FOR 5 DAYS BEFORE ISSUING A FINAL NEGATIVE REPORT   Report Status PENDING   Incomplete     Studies/Results: US Biopsy  04/10/2011  *RADIOLOGY REPORT*  Clinical history:21 year old with proteinuria.  PROCEDURE(S): ULTRASOUND GUIDED RIGHT RENAL BIOPSY  Physician: Rachelle Hora. Henn, MD  Medications:Versed 1 mg, Fentanyl 50 mcg. A radiology nurse monitored the patient for moderate sedation.  Moderate sedation time:30 minutes  Fluoroscopy time: None  Procedure:The procedure was explained to the patient.  The risks and benefits of the procedure were discussed and the patient's questions were addressed.  Informed consent was obtained from the patient.  The patient was placed prone and both kidneys were evaluated.  The right flank was prepped and draped in a sterile fashion.  The skin was anesthetized with 1% lidocaine.  A 16 gauge core needle was directed into the lower pole cortex with ultrasound guidance.  Three core biopsies were obtained.  The second and third core biopsies were adequate.  Samples were placed in saline. Following the third core biopsy, there was immediate development of a perinephric hematoma.  Active bleeding was identified within the hematoma.  The patient was observed with ultrasound for approximately 10 minutes.  Eventually, the blood flow within the perinephric hematoma stopped along with the bleeding from the kidney.  The patient was left in the prone position and monitored in the radiology nursing area for 1 hour.  The patient's vital signs were stable.  The patient was then returned to the floor.  Findings:There is fullness in the renal collecting systems bilaterally, left side greater than right.  Biopsies obtained from the right kidney lower pole.  Following the third core biopsy, there was immediate development of a perinephric hematoma.  The area of active bleeding was easily identified with ultrasound. After 10 minutes  of observation, the area of active bleeding resolved and  there was no longer blood flow within the perinephric hematoma.  Complications: Development of a right perinephric hematoma.  Impression:Ultrasound guided core biopsies of the right kidney.  Development of a right perinephric hematoma following the core biopsies.  The area of bleeding was visualized with ultrasound. The bleeding resolved within 10 minutes.  The patient will be put on bedrest for 24 hours.  Fullness of the collecting systems, left side greater than right.  Original Report Authenticated By: Richarda Overlie, M.D.    Medications: I have reviewed the patient's current medications.  21 year old female with cardiac arrest due to respiratory arrest, who has significant cardiomyopathy and HTN, with acute renal failure, now extubated 1/10, and recovering renal function (unknown baseline creatinine). Illness preceded by edema and leg/abd swelling for a month or 2 PTA)   1. VDRF - post arrest, resolved.  2. Cardiac - Cardiomyopathy with reduced systolic function, EF 40-45% (Echo 03/27/2011). Suspect HTN-cardiomyopathy. Was flu negative, ? 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  - Renal artery stenosis U/S negative on 1/14   3. Renal: AKI,  HIV Neg  ANA Neg, DSDNA ab 22 (WNL) ASO 87 (WNL)  Diract Antiglobulin complement and IgG Neg  Total complement high, C3, C4 Normal  PTH 250.2 (H)  Kappa Light Chains 8.6 (H) Lamda Light Chains 5.8(H), Ratio 1.46 (WNL)  ANCA Negative  Continue with lasix. Renal following.  renal biopsy 1-18.  Cr trending down.  4. ID - : WBC trending down.  Received treatment for PNA.  Will follow urine culture. Yeast urine.  Antibiotics:  03/26/11 vanc >>1/5  03/26/11 ceftriaxone >> 04/02/11  03/26/11 moxi >>1/5  03/26/11 tamiflu >>03/27/11   5. Neuro - seizures, myoclonus. Also with severe B LE weakness.  (Depacon 1000 mg q12 h)  Phenergan stopped. Myoclonus improved on Clonazepam .   6. Heme Anemia (03/26/2011).  -Continue to monitor. No indication  for PRBCs presently  7. Diet:  Diet changed to regular.  8-Hypertension:  Continue with hydralazine, clonidine, metoprolol. BP improved on low dose Norvasc.  9-Hypokalemia: replaced.  10-Abdominal pain, diarrhea: c diff negative. KUB negative. Abdominal pain resolved.  11-Perinephric Hematoma: Hb stable. Disposicion: transfer to rehab Monday.      LOS: 16 days   Rori Goar M.D.  [facility] 04/11/2011, 12:27 PM

## 2011-04-11 NOTE — Progress Notes (Signed)
Subjective: No complaints, good UOP.  Objective Vital signs in last 24 hours: Filed Vitals:   04/11/11 0631 04/11/11 1478 04/11/11 0635 04/11/11 1400  BP: 118/72 115/74 120/83 143/92  Pulse: 55 71 60 70  Temp:    98 F (36.7 C)  TempSrc:    Oral  Resp:    16  Height:      Weight:      SpO2:    93%   Weight change:   Intake/Output Summary (Last 24 hours) at 04/11/11 1555 Last data filed at 04/11/11 0130  Gross per 24 hour  Intake    790 ml  Output   1226 ml  Net   -436 ml   Labs: Basic Metabolic Panel:  Lab 04/11/11 2956 04/10/11 0610 04/09/11 0837 04/08/11 0550 04/07/11 0500 04/06/11 0530 04/05/11 0503  NA 141 138 137 139 139 141 143  K 3.8 4.0 3.2* 3.2* 3.6 3.9 4.3  CL 107 106 105 106 106 105 104  CO2 24 21 21 21 21 23 26   GLUCOSE 109* 93 91 91 89 91 110*  BUN 77* 84* 81* 83* 85* 83* 82*  CREATININE 2.01* 2.31* 2.18* 2.31* 2.54* 2.57* 2.56*  ALB -- -- -- -- -- -- --  CALCIUM 8.0* 7.9* 7.9* 7.9* 7.9* 8.4 8.8  PHOS 4.8* -- -- -- 6.6* 5.9* 7.1*   Liver Function Tests:  Lab 04/11/11 0500 04/06/11 0530 04/05/11 0503  AST -- 30 --  ALT -- 14 --  ALKPHOS -- 63 --  BILITOT -- 0.2* --  PROT -- 5.2* --  ALBUMIN 1.7* 1.7* 1.8*   No results found for this basename: LIPASE:3,AMYLASE:3 in the last 168 hours No results found for this basename: AMMONIA:3 in the last 168 hours CBC:  Lab 04/11/11 0500 04/10/11 1932 04/10/11 1145 04/10/11 0610  WBC 10.7* 10.2 9.9 10.9*  NEUTROABS -- -- -- --  HGB 9.1* 9.3* 8.7* 9.1*  HCT 27.3* 27.4* 26.0* 27.2*  MCV 88.1 87.8 87.2 86.9  PLT 303 294 271 270   PT/INR: @labrcntip (inr:5) Cardiac Enzymes: No results found for this basename: CKTOTAL:5,CKMB:5,CKMBINDEX:5,TROPONINI:5 in the last 168 hours CBG:  Lab 04/07/11 0737  GLUCAP 86    Iron Studies: No results found for this basename: IRON:30,TIBC:30,TRANSFERRIN:30,FERRITIN:30 in the last 168 hours Studies/Results: US Biopsy  04/10/2011  *RADIOLOGY REPORT*  Clinical  history:21 year old with proteinuria.  PROCEDURE(S): ULTRASOUND GUIDED RIGHT RENAL BIOPSY  Physician: Rachelle Hora. Henn, MD  Medications:Versed 1 mg, Fentanyl 50 mcg. A radiology nurse monitored the patient for moderate sedation.  Moderate sedation time:30 minutes  Fluoroscopy time: None  Procedure:The procedure was explained to the patient.  The risks and benefits of the procedure were discussed and the patient's questions were addressed.  Informed consent was obtained from the patient.  The patient was placed prone and both kidneys were evaluated.  The right flank was prepped and draped in a sterile fashion.  The skin was anesthetized with 1% lidocaine.  A 16 gauge core needle was directed into the lower pole cortex with ultrasound guidance.  Three core biopsies were obtained.  The second and third core biopsies were adequate.  Samples were placed in saline. Following the third core biopsy, there was immediate development of a perinephric hematoma.  Active bleeding was identified within the hematoma.  The patient was observed with ultrasound for approximately 10 minutes.  Eventually, the blood flow within the perinephric hematoma stopped along with the bleeding from the kidney.  The patient was left in the prone  position and monitored in the radiology nursing area for 1 hour.  The patient's vital signs were stable.  The patient was then returned to the floor.  Findings:There is fullness in the renal collecting systems bilaterally, left side greater than right.  Biopsies obtained from the right kidney lower pole.  Following the third core biopsy, there was immediate development of a perinephric hematoma.  The area of active bleeding was easily identified with ultrasound. After 10 minutes of observation, the area of active bleeding resolved and there was no longer blood flow within the perinephric hematoma.  Complications: Development of a right perinephric hematoma.  Impression:Ultrasound guided core biopsies of the  right kidney.  Development of a right perinephric hematoma following the core biopsies.  The area of bleeding was visualized with ultrasound. The bleeding resolved within 10 minutes.  The patient will be put on bedrest for 24 hours.  Fullness of the collecting systems, left side greater than right.  Original Report Authenticated By: Richarda Overlie, M.D.   Medications:    . sodium chloride 20 mL/hr at 04/10/11 2139      . amLODipine  2.5 mg Oral Daily  . antiseptic oral rinse  15 mL Mouth Rinse BID  . clonazePAM  0.5 mg Oral Daily  . cloNIDine  0.3 mg Transdermal Weekly  . famotidine  20 mg Oral Daily  . feeding supplement  237 mL Oral BID  . fentaNYL      . furosemide  120 mg Oral BID  . hydrALAZINE  25 mg Oral QID  . metoprolol tartrate  100 mg Oral BID  . midazolam      . sodium chloride  10 mL Intracatheter Q12H  . sodium chloride  3 mL Intravenous Q12H  . valproate sodium  1,000 mg Intravenous Q12H    I  have reviewed scheduled and prn medications.  Physical Exam:  Blood pressure 143/92, pulse 70, temperature 98 F (36.7 C), temperature source Oral, resp. rate 16, height 5\' 2"  (1.575 m), weight 69.536 kg (153 lb 4.8 oz), last menstrual period 03/02/2011, SpO2 93.00%.  Gen: alert, lying flat, no distress, calm and pleasant Skin: no rash, cyanosis Neck: no JVD, bruits or LAN Chest: fine crackles R base Heart: regular, no rub or gallop Abdomen: soft, nontender Ext: no sig edema Neuro: alert, Ox3, no focal deficit Heme/Lymph: no bruising or LAN  Problems: 1. AKI, proteinuria, low C3 - renal biopsy pending.  Creat improving, down to 2.0 today 2. Malignant HTN - better, on 3 BP meds and high dose po lasix 3. S/P arrest   Plan:  Await results of biopsy.  Will see again Monday.  Please call over W/E as needed  Vinson Moselle, MD [facility] [phone] pager   [phone] cell 04/11/2011, 3:55 PM

## 2011-04-11 NOTE — Progress Notes (Signed)
Patient seen and examined and agree with assessment and plan as above.   Vinson Moselle, MD [employer] [phone] cell 04/11/2011, 8:21 AM

## 2011-04-11 NOTE — Progress Notes (Signed)
Subjective: Denies flank pain.  Quiet, but appropriate.   Objective: Vital signs in last 24 hours: Temp:  [98.2 F (36.8 C)-98.3 F (36.8 C)] 98.2 F (36.8 C) (01/19 0600) Pulse Rate:  [54-88] 60  (01/19 0635) Resp:  [16-18] 16  (01/19 0600) BP: (90-187)/(53-107) 120/83 mmHg (01/19 0635) SpO2:  [95 %-98 %] 98 % (01/19 0600) Last BM Date: 04/10/11    Physical exam :  Biopsy site clean and dry with minimal tenderness to palpation. Abdomen : soft, positive bowel sounds.   Lab Results:   Basename 04/11/11 0500 04/10/11 1932  WBC 10.7* 10.2  HGB 9.1* 9.3*  HCT 27.3* 27.4*  PLT 303 294   BMET  Basename 04/11/11 0500 04/10/11 0610  NA 141 138  K 3.8 4.0  CL 107 106  CO2 24 21  GLUCOSE 109* 93  BUN 77* 84*  CREATININE 2.01* 2.31*  CALCIUM 8.0* 7.9*    Studies/Results: US Biopsy  04/10/2011  *RADIOLOGY REPORT*  Clinical history:21 year old with proteinuria.  PROCEDURE(S): ULTRASOUND GUIDED RIGHT RENAL BIOPSY  Physician: Rachelle Hora. Henn, MD  Medications:Versed 1 mg, Fentanyl 50 mcg. A radiology nurse monitored the patient for moderate sedation.  Moderate sedation time:30 minutes  Fluoroscopy time: None  Procedure:The procedure was explained to the patient.  The risks and benefits of the procedure were discussed and the patient's questions were addressed.  Informed consent was obtained from the patient.  The patient was placed prone and both kidneys were evaluated.  The right flank was prepped and draped in a sterile fashion.  The skin was anesthetized with 1% lidocaine.  A 16 gauge core needle was directed into the lower pole cortex with ultrasound guidance.  Three core biopsies were obtained.  The second and third core biopsies were adequate.  Samples were placed in saline. Following the third core biopsy, there was immediate development of a perinephric hematoma.  Active bleeding was identified within the hematoma.  The patient was observed with ultrasound for approximately 10  minutes.  Eventually, the blood flow within the perinephric hematoma stopped along with the bleeding from the kidney.  The patient was left in the prone position and monitored in the radiology nursing area for 1 hour.  The patient's vital signs were stable.  The patient was then returned to the floor.  Findings:There is fullness in the renal collecting systems bilaterally, left side greater than right.  Biopsies obtained from the right kidney lower pole.  Following the third core biopsy, there was immediate development of a perinephric hematoma.  The area of active bleeding was easily identified with ultrasound. After 10 minutes of observation, the area of active bleeding resolved and there was no longer blood flow within the perinephric hematoma.  Complications: Development of a right perinephric hematoma.  Impression:Ultrasound guided core biopsies of the right kidney.  Development of a right perinephric hematoma following the core biopsies.  The area of bleeding was visualized with ultrasound. The bleeding resolved within 10 minutes.  The patient will be put on bedrest for 24 hours.  Fullness of the collecting systems, left side greater than right.  Original Report Authenticated By: Richarda Overlie, M.D.    Anti-infectives: Anti-infectives     Start     Dose/Rate Route Frequency Ordered Stop   03/28/11 2000   vancomycin (VANCOCIN) 750 mg in sodium chloride 0.9 % 150 mL IVPB  Status:  Discontinued        750 mg 150 mL/hr over 60 Minutes Intravenous Every 24 hours 03/28/11  1610 03/28/11 1350   03/27/11 2000   vancomycin (VANCOCIN) IVPB 1000 mg/200 mL premix  Status:  Discontinued        1,000 mg 200 mL/hr over 60 Minutes Intravenous Every 24 hours 03/26/11 2318 03/28/11 0932   03/27/11 0100   cefTRIAXone (ROCEPHIN) 1 g in dextrose 5 % 50 mL IVPB  Status:  Discontinued        1 g 100 mL/hr over 30 Minutes Intravenous Every 24 hours 03/26/11 2318 04/02/11 0823   03/26/11 2330   oseltamivir (TAMIFLU) 6  MG/ML suspension 75 mg  Status:  Discontinued        75 mg Oral 2 times daily 03/26/11 2314 03/26/11 2321   03/26/11 2330   moxifloxacin (AVELOX) IVPB 400 mg  Status:  Discontinued        400 mg 250 mL/hr over 60 Minutes Intravenous Every 24 hours 03/26/11 2318 03/28/11 1350   03/26/11 2330   oseltamivir (TAMIFLU) 6 MG/ML suspension 75 mg  Status:  Discontinued        75 mg Per Tube 2 times daily 03/26/11 2321 03/27/11 1048   03/26/11 2245   moxifloxacin (AVELOX) IVPB 400 mg  Status:  Discontinued        400 mg 250 mL/hr over 60 Minutes Intravenous Every 24 hours 03/26/11 2243 03/26/11 2319   03/26/11 2245   oseltamivir (TAMIFLU) capsule 75 mg  Status:  Discontinued        75 mg Oral 2 times daily 03/26/11 2243 03/26/11 2313   03/26/11 2200   ceFEPIme (MAXIPIME) 1 g in dextrose 5 % 50 mL IVPB  Status:  Discontinued        1 g 100 mL/hr over 30 Minutes Intravenous Every 12 hours 03/26/11 2030 03/26/11 2243   03/26/11 2045   metroNIDAZOLE (FLAGYL) IVPB 500 mg  Status:  Discontinued        500 mg 100 mL/hr over 60 Minutes Intravenous Every 8 hours 03/26/11 2030 03/26/11 2146   03/26/11 2030   vancomycin (VANCOCIN) IVPB 1000 mg/200 mL premix        1,000 mg 200 mL/hr over 60 Minutes Intravenous  Once 03/26/11 2029 03/26/11 2135          Assessment/Plan: s/p random renal core biopsy with hematoma. H/H being followed - stable so far.  IR available for any significant increase in pain or acute changes in H/H. Pathology sent to [place] - to follow up with Dr. Allena Katz.   Mechelle Pates D 04/11/2011

## 2011-04-12 LAB — CBC
HCT: 25.4 % — ABNORMAL LOW (ref 36.0–46.0)
Hemoglobin: 8.3 g/dL — ABNORMAL LOW (ref 12.0–15.0)
MCH: 28.9 pg (ref 26.0–34.0)
MCHC: 32.7 g/dL (ref 30.0–36.0)

## 2011-04-12 LAB — RENAL FUNCTION PANEL
BUN: 73 mg/dL — ABNORMAL HIGH (ref 6–23)
Calcium: 7.8 mg/dL — ABNORMAL LOW (ref 8.4–10.5)
Glucose, Bld: 83 mg/dL (ref 70–99)
Phosphorus: 4.4 mg/dL (ref 2.3–4.6)

## 2011-04-12 MED ORDER — FERROUS SULFATE 325 (65 FE) MG PO TABS
325.0000 mg | ORAL_TABLET | Freq: Three times a day (TID) | ORAL | Status: DC
  Administered 2011-04-12 – 2011-04-13 (×4): 325 mg via ORAL
  Filled 2011-04-12 (×6): qty 1

## 2011-04-12 NOTE — Progress Notes (Addendum)
Subjective: No complaints, good UOP.  Objective Vital signs in last 24 hours: Filed Vitals:   04/11/11 0635 04/11/11 1400 04/11/11 2100 04/12/11 0500  BP: 120/83 143/92 142/78 164/85  Pulse: 60 70 82 75  Temp:  98 F (36.7 C) 99.1 F (37.3 C) 97.9 F (36.6 C)  TempSrc:  Oral Oral Oral  Resp:  16 18 18   Height:      Weight:      SpO2:  93% 97% 98%   Weight change:   Intake/Output Summary (Last 24 hours) at 04/12/11 1205 Last data filed at 04/12/11 0500  Gross per 24 hour  Intake      0 ml  Output   1700 ml  Net  -1700 ml   Labs: Basic Metabolic Panel:  Lab 04/12/11 1610 04/11/11 0500 04/10/11 0610 04/09/11 0837 04/08/11 0550 04/07/11 0500 04/06/11 0530  NA 141 141 138 137 139 139 141  K 3.8 3.8 4.0 3.2* 3.2* 3.6 3.9  CL 106 107 106 105 106 106 105  CO2 25 24 21 21 21 21 23   GLUCOSE 83 109* 93 91 91 89 91  BUN 73* 77* 84* 81* 83* 85* 83*  CREATININE 1.99* 2.01* 2.31* 2.18* 2.31* 2.54* 2.57*  ALB -- -- -- -- -- -- --  CALCIUM 7.8* 8.0* 7.9* 7.9* 7.9* 7.9* 8.4  PHOS 4.4 4.8* -- -- -- 6.6* 5.9*   Liver Function Tests:  Lab 04/12/11 0500 04/11/11 0500 04/06/11 0530  AST -- -- 30  ALT -- -- 14  ALKPHOS -- -- 63  BILITOT -- -- 0.2*  PROT -- -- 5.2*  ALBUMIN 1.6* 1.7* 1.7*   No results found for this basename: LIPASE:3,AMYLASE:3 in the last 168 hours No results found for this basename: AMMONIA:3 in the last 168 hours CBC:  Lab 04/12/11 0500 04/11/11 0500 04/10/11 1932 04/10/11 1145  WBC 10.6* 10.7* 10.2 9.9  NEUTROABS -- -- -- --  HGB 8.3* 9.1* 9.3* 8.7*  HCT 25.4* 27.3* 27.4* 26.0*  MCV 88.5 88.1 87.8 87.2  PLT 311 303 294 271   PT/INR: @labrcntip (inr:5) Cardiac Enzymes: No results found for this basename: CKTOTAL:5,CKMB:5,CKMBINDEX:5,TROPONINI:5 in the last 168 hours CBG:  Lab 04/07/11 0737  GLUCAP 86    Iron Studies: No results found for this basename: IRON:30,TIBC:30,TRANSFERRIN:30,FERRITIN:30 in the last 168 hours Studies/Results: No results  found. Medications:    . sodium chloride 20 mL/hr at 04/10/11 2139      . amLODipine  2.5 mg Oral Daily  . antiseptic oral rinse  15 mL Mouth Rinse BID  . clonazePAM  0.5 mg Oral Daily  . cloNIDine  0.3 mg Transdermal Weekly  . famotidine  20 mg Oral Daily  . feeding supplement  237 mL Oral BID  . furosemide  120 mg Oral BID  . hydrALAZINE  25 mg Oral QID  . metoprolol tartrate  100 mg Oral BID  . sodium chloride  10 mL Intracatheter Q12H  . sodium chloride  3 mL Intravenous Q12H  . valproate sodium  1,000 mg Intravenous Q12H    I  have reviewed scheduled and prn medications.  Physical Exam:  Blood pressure 164/85, pulse 75, temperature 97.9 F (36.6 C), temperature source Oral, resp. rate 18, height 5\' 2"  (1.575 m), weight 69.536 kg (153 lb 4.8 oz), last menstrual period 03/02/2011, SpO2 98.00%.  Gen: alert, lying flat, no distress, calm and pleasant Skin: no rash, cyanosis Neck: no JVD, bruits or LAN Chest: fine crackles R base Heart: regular, no  rub or gallop Abdomen: soft, nontender Ext: no sig edema Neuro: alert, Ox3, no focal deficit Heme/Lymph: no bruising or LAN  Problems: 1. AKI, proteinuria, low C3 - renal biopsy pending.  Creat stable around 2.0.  Could be due to malig HTN, or glomerular disease.   2. Malignant HTN - better, on 3 BP meds and high dose po lasix.  Volume excess resolved and making good amounts of urine.  Will d/c lasix for now.  3. S/P arrest  4. Anemia 4.  Peri-renal hematoma, s/p biopsy, seen at time of bx by [place] per IR -  Hb has not changed much, was 8.7-9.1 pre biopsy, and 8.3 today.  No hematuria, asymptomatic.  Follow CBC, re-image if drops significantly.  Plan:  Await results of biopsy, d/c po lasix.    Heather Moselle, MD [facility] [phone] pager   [phone] cell 04/12/2011, 12:05 PM

## 2011-04-12 NOTE — Progress Notes (Signed)
Subjective: Patient lying in bed in not distress. No complaints, was resting comfortable.   Objective: Filed Vitals:   04/11/11 1400 04/11/11 2100 04/12/11 0500 04/12/11 1400  BP: 143/92 142/78 164/85 162/76  Pulse: 70 82 75 82  Temp: 98 F (36.7 C) 99.1 F (37.3 C) 97.9 F (36.6 C) 98 F (36.7 C)  TempSrc: Oral Oral Oral Oral  Resp: 16 18 18 16   Height:      Weight:      SpO2: 93% 97% 98% 98%   Weight change:   Intake/Output Summary (Last 24 hours) at 04/12/11 1606 Last data filed at 04/12/11 0500  Gross per 24 hour  Intake      0 ml  Output    700 ml  Net   -700 ml    General: Alert, awake, oriented x3, in no acute distress.  HEENT: No bruits, no goiter.  Heart: Regular rate and rhythm, without murmurs, rubs, gallops.  Lungs: CTA, bilateral air movement.  Abdomen: Soft, nontender, nondistended, positive bowel sounds.  Neuro: Grossly intact, nonfocal. Extremities; trace edema.   Lab Results:  Basename 04/12/11 0500 04/11/11 0500  NA 141 141  K 3.8 3.8  CL 106 107  CO2 25 24  GLUCOSE 83 109*  BUN 73* 77*  CREATININE 1.99* 2.01*  CALCIUM 7.8* 8.0*  MG -- --  PHOS 4.4 4.8*    Basename 04/12/11 0500 04/11/11 0500  AST -- --  ALT -- --  ALKPHOS -- --  BILITOT -- --  PROT -- --  ALBUMIN 1.6* 1.7*   Basename 04/12/11 0500 04/11/11 0500  WBC 10.6* 10.7*  NEUTROABS -- --  HGB 8.3* 9.1*  HCT 25.4* 27.3*  MCV 88.5 88.1  PLT 311 303    Micro Results: Recent Results (from the past 240 hour(s))  URINE CULTURE     Status: Normal   Collection Time   04/06/11  5:30 AM      Component Value Range Status Comment   Specimen Description URINE, CATHETERIZED   Final    Special Requests NONE   Final    Setup Time [id number]   Final    Colony Count 80,000 COLONIES/ML   Final    Culture YEAST   Final    Report Status 04/07/2011 FINAL   Final   CLOSTRIDIUM DIFFICILE BY PCR     Status: Normal   Collection Time   04/06/11  5:40 AM      Component Value Range  Status Comment   C difficile by pcr NEGATIVE  NEGATIVE  Final   CULTURE, BLOOD (ROUTINE X 2)     Status: Normal   Collection Time   04/06/11  9:47 AM      Component Value Range Status Comment   Specimen Description BLOOD RIGHT ANTECUBITAL   Final    Special Requests BOTTLES DRAWN AEROBIC AND ANAEROBIC 10CC   Final    Setup Time [id number]   Final    Culture NO GROWTH 5 DAYS   Final    Report Status 04/12/2011 FINAL   Final   CULTURE, BLOOD (ROUTINE X 2)     Status: Normal   Collection Time   04/06/11 10:00 AM      Component Value Range Status Comment   Specimen Description BLOOD RIGHT HAND   Final    Special Requests BOTTLES DRAWN AEROBIC AND ANAEROBIC 10CC   Final    Setup Time [id number]   Final    Culture NO GROWTH 5 DAYS  Final    Report Status 04/12/2011 FINAL   Final     Studies/Results: No results found.  Medications: I have reviewed the patient's current medications.  21 year old female with cardiac arrest due to respiratory arrest, who has significant cardiomyopathy and HTN, with acute renal failure, now extubated 1/10, and recovering renal function (unknown baseline creatinine). Illness preceded by edema and leg/abd swelling for a month or 2 PTA)   1. VDRF - post arrest, resolved.  2. Cardiac - Cardiomyopathy with reduced systolic function, EF 40-45% (Echo 03/27/2011). Suspect HTN-cardiomyopathy. Was flu negative, ? 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  - Renal artery stenosis U/S negative on 1/14   3. Renal: AKI,  HIV Neg  ANA Neg, DSDNA ab 22 (WNL) ASO 87 (WNL)  Diract Antiglobulin complement and IgG Neg  Total complement high, C3, C4 Normal  PTH 250.2 (H)  Kappa Light Chains 8.6 (H) Lamda Light Chains 5.8(H), Ratio 1.46 (WNL)  ANCA Negative  Lasix stopped 1-19.  renal biopsy 1-18.  Cr trending down.   4. ID - : WBC trending down.  Received treatment for PNA.  Will follow urine culture. Yeast urine.  Antibiotics:  03/26/11 vanc  >>1/5  03/26/11 ceftriaxone >> 04/02/11  03/26/11 moxi >>1/5  03/26/11 tamiflu >>03/27/11   5. Neuro - seizures, myoclonus. Also with severe B LE weakness.  (Depacon 1000 mg q12 h)  Phenergan stopped. Myoclonus improved on Clonazepam .   6. Heme Anemia (03/26/2011).  -Continue to monitor. No indication for PRBCs presently  7. Diet:  Diet changed to regular.  8-Hypertension:  Continue with hydralazine, clonidine, metoprolol, Norvasc.  9-Hypokalemia: replaced.  10-Abdominal pain, diarrhea: c diff negative. KUB negative. Abdominal pain resolved.  11-Perinephric Hematoma: Hb stable. Repeat in am.  Disposicion: transfer to rehab Monday.      LOS: 17 days   Gabriell Casimir M.D.  [facility] 04/12/2011, 4:06 PM

## 2011-04-13 ENCOUNTER — Inpatient Hospital Stay (HOSPITAL_COMMUNITY)
Admission: RE | Admit: 2011-04-13 | Discharge: 2011-04-28 | DRG: 945 | Disposition: A | Discharge status: Other Acute Inpatient Hospital | Payer: Medicaid Other | Source: Ambulatory Visit | Attending: Physical Medicine & Rehabilitation | Admitting: Physical Medicine & Rehabilitation

## 2011-04-13 DIAGNOSIS — G40909 Epilepsy, unspecified, not intractable, without status epilepticus: Secondary | ICD-10-CM | POA: Diagnosis present

## 2011-04-13 DIAGNOSIS — I428 Other cardiomyopathies: Secondary | ICD-10-CM | POA: Diagnosis present

## 2011-04-13 DIAGNOSIS — I469 Cardiac arrest, cause unspecified: Secondary | ICD-10-CM | POA: Diagnosis present

## 2011-04-13 DIAGNOSIS — G931 Anoxic brain damage, not elsewhere classified: Secondary | ICD-10-CM

## 2011-04-13 DIAGNOSIS — Z5189 Encounter for other specified aftercare: Principal | ICD-10-CM

## 2011-04-13 DIAGNOSIS — I509 Heart failure, unspecified: Secondary | ICD-10-CM

## 2011-04-13 DIAGNOSIS — J189 Pneumonia, unspecified organism: Secondary | ICD-10-CM

## 2011-04-13 DIAGNOSIS — R569 Unspecified convulsions: Secondary | ICD-10-CM

## 2011-04-13 DIAGNOSIS — N059 Unspecified nephritic syndrome with unspecified morphologic changes: Secondary | ICD-10-CM | POA: Diagnosis present

## 2011-04-13 DIAGNOSIS — D649 Anemia, unspecified: Secondary | ICD-10-CM | POA: Diagnosis present

## 2011-04-13 DIAGNOSIS — R5381 Other malaise: Secondary | ICD-10-CM

## 2011-04-13 DIAGNOSIS — N179 Acute kidney failure, unspecified: Secondary | ICD-10-CM | POA: Diagnosis present

## 2011-04-13 DIAGNOSIS — N19 Unspecified kidney failure: Secondary | ICD-10-CM | POA: Diagnosis present

## 2011-04-13 DIAGNOSIS — I10 Essential (primary) hypertension: Secondary | ICD-10-CM

## 2011-04-13 LAB — DIFFERENTIAL
Basophils Relative: 0 % (ref 0–1)
Eosinophils Absolute: 0.2 10*3/uL (ref 0.0–0.7)
Eosinophils Relative: 2 % (ref 0–5)
Lymphs Abs: 1.5 10*3/uL (ref 0.7–4.0)
Monocytes Absolute: 1 10*3/uL (ref 0.1–1.0)
Monocytes Relative: 12 % (ref 3–12)
Neutrophils Relative %: 68 % (ref 43–77)

## 2011-04-13 LAB — CBC
HCT: 25.5 % — ABNORMAL LOW (ref 36.0–46.0)
HCT: 29.2 % — ABNORMAL LOW (ref 36.0–46.0)
Hemoglobin: 8.5 g/dL — ABNORMAL LOW (ref 12.0–15.0)
Hemoglobin: 9.9 g/dL — ABNORMAL LOW (ref 12.0–15.0)
MCHC: 33.3 g/dL (ref 30.0–36.0)
RBC: 2.86 MIL/uL — ABNORMAL LOW (ref 3.87–5.11)
RDW: 14.2 % (ref 11.5–15.5)
WBC: 8.4 10*3/uL (ref 4.0–10.5)

## 2011-04-13 LAB — COMPREHENSIVE METABOLIC PANEL
ALT: 12 U/L (ref 0–35)
Albumin: 1.7 g/dL — ABNORMAL LOW (ref 3.5–5.2)
Alkaline Phosphatase: 50 U/L (ref 39–117)
BUN: 65 mg/dL — ABNORMAL HIGH (ref 6–23)
Chloride: 104 mEq/L (ref 96–112)
Potassium: 4 mEq/L (ref 3.5–5.1)
Sodium: 141 mEq/L (ref 135–145)
Total Bilirubin: 0.1 mg/dL — ABNORMAL LOW (ref 0.3–1.2)
Total Protein: 5.2 g/dL — ABNORMAL LOW (ref 6.0–8.3)

## 2011-04-13 LAB — RENAL FUNCTION PANEL
CO2: 25 mEq/L (ref 19–32)
Calcium: 7.9 mg/dL — ABNORMAL LOW (ref 8.4–10.5)
Creatinine, Ser: 1.95 mg/dL — ABNORMAL HIGH (ref 0.50–1.10)
GFR calc Af Amer: 41 mL/min — ABNORMAL LOW (ref >90)
Glucose, Bld: 92 mg/dL (ref 70–99)
Sodium: 141 mEq/L (ref 135–145)

## 2011-04-13 MED ORDER — HYDRALAZINE HCL 25 MG PO TABS
25.0000 mg | ORAL_TABLET | Freq: Four times a day (QID) | ORAL | Status: DC
  Administered 2011-04-13 – 2011-04-14 (×3): 25 mg via ORAL
  Filled 2011-04-13 (×7): qty 1

## 2011-04-13 MED ORDER — DIPHENOXYLATE-ATROPINE 2.5-0.025 MG PO TABS: 1.0000 | ORAL_TABLET | ORAL | Status: DC | PRN

## 2011-04-13 MED ORDER — METOPROLOL TARTRATE 100 MG PO TABS
100.0000 mg | ORAL_TABLET | Freq: Two times a day (BID) | ORAL | Status: DC
  Administered 2011-04-13 – 2011-04-15 (×5): 100 mg via ORAL
  Filled 2011-04-13 (×8): qty 1

## 2011-04-13 MED ORDER — PROMETHAZINE HCL 12.5 MG RE SUPP: 12.5000 mg | Freq: Four times a day (QID) | RECTAL | Status: DC | PRN

## 2011-04-13 MED ORDER — PROMETHAZINE HCL 12.5 MG PO TABS: 12.5000 mg | ORAL_TABLET | Freq: Four times a day (QID) | ORAL | Status: DC | PRN

## 2011-04-13 MED ORDER — ACETAMINOPHEN 325 MG PO TABS
325.0000 mg | ORAL_TABLET | ORAL | Status: DC | PRN
  Administered 2011-04-14 – 2011-04-22 (×7): 650 mg via ORAL
  Administered 2011-04-22: 325 mg via ORAL
  Administered 2011-04-25 (×2): 650 mg via ORAL
  Administered 2011-04-25 – 2011-04-27 (×3): 325 mg via ORAL
  Filled 2011-04-13 (×3): qty 2
  Filled 2011-04-13: qty 1
  Filled 2011-04-13 (×5): qty 2
  Filled 2011-04-13 (×3): qty 1
  Filled 2011-04-13: qty 2

## 2011-04-13 MED ORDER — CLONIDINE HCL 0.3 MG/24HR TD PTWK
0.3000 mg | MEDICATED_PATCH | TRANSDERMAL | Status: DC
  Administered 2011-04-14: 0.3 mg via TRANSDERMAL
  Filled 2011-04-13: qty 1

## 2011-04-13 MED ORDER — AMLODIPINE BESYLATE 2.5 MG PO TABS
2.5000 mg | ORAL_TABLET | Freq: Every day | ORAL | Status: DC
  Administered 2011-04-14: 2.5 mg via ORAL
  Filled 2011-04-13 (×3): qty 1

## 2011-04-13 MED ORDER — SENNA 8.6 MG PO TABS
1.0000 | ORAL_TABLET | Freq: Two times a day (BID) | ORAL | Status: DC
  Administered 2011-04-13 – 2011-04-28 (×29): 8.6 mg via ORAL
  Filled 2011-04-13 (×35): qty 1

## 2011-04-13 MED ORDER — DIVALPROEX SODIUM 500 MG PO DR TAB
1000.0000 mg | DELAYED_RELEASE_TABLET | Freq: Two times a day (BID) | ORAL | Status: DC
  Administered 2011-04-13 – 2011-04-28 (×30): 1000 mg via ORAL
  Filled 2011-04-13 (×36): qty 2

## 2011-04-13 MED ORDER — ENSURE CLINICAL ST REVIGOR PO LIQD
237.0000 mL | Freq: Two times a day (BID) | ORAL | Status: DC
  Administered 2011-04-13 – 2011-04-18 (×4): 237 mL via ORAL
  Administered 2011-04-19: 09:00:00 via ORAL
  Administered 2011-04-20 – 2011-04-22 (×4): 237 mL via ORAL
  Administered 2011-04-24: 17:00:00 via ORAL
  Administered 2011-04-25 – 2011-04-27 (×6): 237 mL via ORAL
  Filled 2011-04-13: qty 237

## 2011-04-13 MED ORDER — FAMOTIDINE 20 MG PO TABS
20.0000 mg | ORAL_TABLET | Freq: Every day | ORAL | Status: DC
  Administered 2011-04-14 – 2011-04-28 (×15): 20 mg via ORAL
  Filled 2011-04-13 (×20): qty 1

## 2011-04-13 MED ORDER — FERROUS SULFATE 325 (65 FE) MG PO TABS
325.0000 mg | ORAL_TABLET | Freq: Three times a day (TID) | ORAL | Status: DC
  Administered 2011-04-13 – 2011-04-28 (×44): 325 mg via ORAL
  Filled 2011-04-13 (×48): qty 1

## 2011-04-13 MED ORDER — SENNOSIDES-DOCUSATE SODIUM 8.6-50 MG PO TABS: 1.0000 | ORAL_TABLET | Freq: Every evening | ORAL | Status: DC | PRN

## 2011-04-13 MED ORDER — PROMETHAZINE HCL 25 MG/ML IJ SOLN: 12.5000 mg | Freq: Four times a day (QID) | INTRAMUSCULAR | Status: DC | PRN

## 2011-04-13 MED ORDER — CLONAZEPAM 0.5 MG PO TABS
0.5000 mg | ORAL_TABLET | Freq: Every day | ORAL | Status: DC
  Administered 2011-04-14 – 2011-04-28 (×15): 0.5 mg via ORAL
  Filled 2011-04-13 (×15): qty 1

## 2011-04-13 MED ORDER — DIVALPROEX SODIUM 500 MG PO DR TAB
1000.0000 mg | DELAYED_RELEASE_TABLET | Freq: Two times a day (BID) | ORAL | Status: DC
  Filled 2011-04-13: qty 2

## 2011-04-13 NOTE — Progress Notes (Signed)
Physical Therapy Treatment Patient Details Name: Heather Sims MRN: [id number] DOB: [birth year] Today's Date: 04/13/2011  PT Assessment/Plan  PT - Assessment/Plan Comments on Treatment Session: 21 y/o female with no past medical history presented to the [facility] ED after respiratory arrest. Pt had much less myoclonus with movements today. She was able to assist more with transfers and bed mobility. She tolerated exercises well. Was fatigued by end of treatment.  PT Plan: Discharge plan remains appropriate PT Frequency: Min 3X/week Follow Up Recommendations: Inpatient Rehab Equipment Recommended: Defer to next venue PT Goals  Acute Rehab PT Goals PT Goal: Supine/Side to Sit - Progress: Met PT Goal: Sit at Edge Of Bed - Progress: Progressing toward goal PT Goal: Sit to Stand - Progress: Progressing toward goal PT Transfer Goal: Bed to Chair/Chair to Bed - Progress: Progressing toward goal  PT Treatment Precautions/Restrictions  Precautions Precautions: Fall Restrictions Weight Bearing Restrictions: No Mobility (including Balance) Bed Mobility Bed Mobility: Yes Rolling Right: Not tested (comment) Rolling Left: Not tested (comment) Supine to Sit: 3: Mod assist Supine to Sit Details (indicate cue type and reason): pt had less myclonus today with this transfer and was able to help more with cueing on where to place hands  Sitting - Scoot to Edge of Bed: 3: Mod assist Transfers Transfers: Yes Sit to Stand: 1: +2 Total assist;From bed;From chair/3-in-1 Sit to Stand Details (indicate cue type and reason): pt = 60%;  x3; myoclonus was much less today.  Stand to Sit: 1: +2 Total assist;To chair/3-in-1 Stand to Sit Details: pt = 60% with cueing on sitting way back to sit and where to place hands  Squat Pivot Transfers: 1: +2 Total assist;With armrests;With upper extremity assistance Squat Pivot Transfer Details (indicate cue type and reason): pt = 50%; pt had less myoclonus during moving     Ambulation/Gait Ambulation/Gait: No Stairs: No Wheelchair Mobility Wheelchair Mobility: No  Balance Balance Assessed: Yes Static Sitting Balance Static Sitting - Balance Support: Bilateral upper extremity supported;Feet unsupported Static Sitting - Level of Assistance: 4: Min assist (able to hold self up but looses balance in every direction however worse posteriorly with overall trunk/postural control in sitting fair to poor) Exercise  General Exercises - Lower Extremity Long Arc Quad: AROM;Strengthening;Both;10 reps;Seated Hip Flexion/Marching: AROM;Strengthening;Both;Seated Toe Raises: AROM;Strengthening;Both;10 reps;Seated Heel Raises: AROM;Strengthening;Both;10 reps;Seated End of Session PT - End of Session Equipment Utilized During Treatment: Gait belt Activity Tolerance: Patient limited by fatigue Patient left: in chair;with call bell in reach;with family/visitor present General Behavior During Session: [facility] for tasks performed Cognition: [facility] for tasks performed  Elvera Bicker 04/13/2011, 2:46 PM  [facility] [phone] (pager)

## 2011-04-13 NOTE — Progress Notes (Signed)
At 0410 pt had run of SVT. Pt sleeping when checked and father said pt had been moving around a little in bed at time of SVT. Strip posted and will leave note for MD. Will cont to monitor closely.

## 2011-04-13 NOTE — Progress Notes (Signed)
Subjective: No complaints, good UOP. Looks fine, ns running at 60/hour  Objective Vital signs in last 24 hours: Filed Vitals:   04/12/11 0500 04/12/11 1400 04/12/11 2100 04/13/11 0500  BP: 164/85 162/76 146/84 155/84  Pulse: 75 82 78 76  Temp: 97.9 F (36.6 C) 98 F (36.7 C) 98.6 F (37 C) 97.4 F (36.3 C)  TempSrc: Oral Oral Oral Oral  Resp: 18 16 18 18   Height:      Weight:      SpO2: 98% 98% 96% 98%   Weight change:   Intake/Output Summary (Last 24 hours) at 04/13/11 0800 Last data filed at 04/13/11 0600  Gross per 24 hour  Intake    240 ml  Output   2000 ml  Net  -1760 ml   Labs: Basic Metabolic Panel:  Lab 04/13/11 4098 04/12/11 0500 04/11/11 0500 04/10/11 0610 04/09/11 0837 04/08/11 0550 04/07/11 0500  NA 141 141 141 138 137 139 139  K 3.8 3.8 3.8 4.0 3.2* 3.2* 3.6  CL 106 106 107 106 105 106 106  CO2 25 25 24 21 21 21 21   GLUCOSE 92 83 109* 93 91 91 89  BUN 72* 73* 77* 84* 81* 83* 85*  CREATININE 1.95* 1.99* 2.01* 2.31* 2.18* 2.31* 2.54*  ALB -- -- -- -- -- -- --  CALCIUM 7.9* 7.8* 8.0* 7.9* 7.9* 7.9* 7.9*  PHOS 5.1* 4.4 4.8* -- -- -- 6.6*   Liver Function Tests:  Lab 04/13/11 0500 04/12/11 0500 04/11/11 0500  AST -- -- --  ALT -- -- --  ALKPHOS -- -- --  BILITOT -- -- --  PROT -- -- --  ALBUMIN 1.5* 1.6* 1.7*   No results found for this basename: LIPASE:3,AMYLASE:3 in the last 168 hours No results found for this basename: AMMONIA:3 in the last 168 hours CBC:  Lab 04/13/11 0500 04/12/11 0500 04/11/11 0500 04/10/11 1932  WBC 8.5 10.6* 10.7* 10.2  NEUTROABS -- -- -- --  HGB 8.5* 8.3* 9.1* 9.3*  HCT 25.5* 25.4* 27.3* 27.4*  MCV 89.2 88.5 88.1 87.8  PLT 319 311 303 294   PT/INR: @labrcntip (inr:5) Cardiac Enzymes: No results found for this basename: CKTOTAL:5,CKMB:5,CKMBINDEX:5,TROPONINI:5 in the last 168 hours CBG:  Lab 04/07/11 0737  GLUCAP 86    Iron Studies: No results found for this basename: IRON:30,TIBC:30,TRANSFERRIN:30,FERRITIN:30  in the last 168 hours Studies/Results: No results found. Medications:    . sodium chloride 20 mL/hr at 04/13/11 0301      . amLODipine  2.5 mg Oral Daily  . antiseptic oral rinse  15 mL Mouth Rinse BID  . clonazePAM  0.5 mg Oral Daily  . cloNIDine  0.3 mg Transdermal Weekly  . famotidine  20 mg Oral Daily  . feeding supplement  237 mL Oral BID  . ferrous sulfate  325 mg Oral TID WC  . hydrALAZINE  25 mg Oral QID  . metoprolol tartrate  100 mg Oral BID  . sodium chloride  10 mL Intracatheter Q12H  . sodium chloride  3 mL Intravenous Q12H  . valproate sodium  1,000 mg Intravenous Q12H  . DISCONTD: furosemide  120 mg Oral BID    I  have reviewed scheduled and prn medications.  Physical Exam:  Blood pressure 155/84, pulse 76, temperature 97.4 F (36.3 C), temperature source Oral, resp. rate 18, height 5\' 2"  (1.575 m), weight 153 lb 4.8 oz (69.536 kg), last menstrual period 03/02/2011, SpO2 98.00%.  Gen: alert,t, no distress, calm and pleasant Skin: no  rash, cyanosis Neck: no JVD, bruits or LAN Chest: fine crackles R base Heart: regular, no rub or gallop Abdomen: soft, nontender Ext: no sig edema Neuro: alert, Ox3, no focal deficit Heme/Lymph: no bruising   Problems: 1. AKI, proteinuria, low C3 - renal biopsy pending.  Creat stable around 2.0.  Could be due to malig HTN, or glomerular disease.  Has been stable for several days. 2. Malignant HTN - better, on 3 BP meds and high dose po lasix.  Volume excess resolved and making good amounts of urine.  Will d/c lasix for now. Probably doesn't need IVF, will see if can d/c  3. S/P arrest  4. Anemia 4.  Peri-renal hematoma, s/p biopsy, seen at time of bx by [place] per IR -  Hb stable.  No hematuria, asymptomatic.  Follow CBC, re-image if drops significantly.  Plan:  Await results of biopsy, d/c po lasix.  Likely to IP rehab today . Everrett Coombe, DO  Patient seen and examined, agree with above note with above modifications.    Annie Sable, MD 04/13/2011

## 2011-04-13 NOTE — H&P (Signed)
Physical Medicine and Rehabilitation Admission H&P  Heather Sims is an 21 y.o. female.  Chief Complaint   Patient presents with   .  Cardiac Arrest   :  HPI: 21 year old right-handed female with recent diagnosis of hypertension as well as upper respiratory infection which she had been placed on Avelox. Admitted January 3 with sudden onset shortness of breath and cardiorespiratory failure with CPR in the field. She was pulseless for approximately 5 minutes and intubated in the emergency department. Underwent hypothermia protocol and suffered seizures upon rewarming. Cranial CT scan and MRI of the brain showed no acute changes. Echocardiogram with ejection fraction 40% and systolic function moderately reduced. TEE with diffuse hypokinesis ejection fraction 45%. Venous Doppler studies lower tremor his were negative. EEG was minimal seizure activity. MRI of cervical thoracic lumbar spine unremarkable. Noted hemoglobin 8.1 and creatinine 2.31 upon admission she was transfused 3 units of packed red blood cells. Renal ultrasound was negative for hydronephrosis. Nephrology consulted felt renal insufficiency due to hypertension. Renal biopsy completed January 18 to complete workup of acute renal insufficiency with results pending. Post operative biopsy with perirenal hematoma and close monitoring with CBC that remained stable. Latest creatinine 1.95 felt to be stabilized. High dose Lasix had been discontinued with stabilizing renal function January 20. Patient had been on subcutaneous heparin for deep vein thrombosis prophylaxis was discontinued January 17 with  renal biopsy completed January 18. Placed on valproate 1000 mg every 12 hours per neurology services for seizure disorder. No further seizure activity has been reported. Patient continued to have bouts of myoclonus of the lower extremities improved after being placed on clonazepam. 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 Physical therapy evaluation completed January 14 as patient was +2 total assist for sit to stand as well as +2 total assist for squat pivot transfers(20%). Occupational therapy evaluations pending. Patient with profound deconditioning in addition to her logical deficits and  thus inpatient rehabilitation services was consulted to consider inpatient rehabilitation  Review of Systems  Constitutional: Positive for malaise/fatigue.  Eyes: Negative for double vision.  Respiratory: Positive for cough and shortness of breath.  Cardiovascular: Negative for chest pain.  Gastrointestinal: Positive for nausea and constipation.  Genitourinary: Negative for dysuria.  Musculoskeletal: Negative.  Neurological: Positive for seizures and weakness. Negative for dizziness and headaches.  Psychiatric/Behavioral: Negative  Past Medical History   Diagnosis  Date   .  Pneumonia  last 2 weeks     'walking pneumonia'    No past surgical history on file.  No family history on file.  Social History: reports that she has never smoked. She does not have any smokeless tobacco history on file. She reports that she does not drink alcohol or use illicit drugs.  Allergies: No Known Allergies  Medications Prior to Admission   Medication  Dose  Route  Frequency  Provider  Last Rate  Last Dose   .  0.9 % sodium chloride infusion   Intravenous  Continuous  Max Fickle, MD  20 mL/hr at 04/13/11 0301    .  amLODipine (NORVASC) tablet 2.5 mg  2.5 mg  Oral  Daily  Belkys Regalado, MD   2.5 mg at 04/13/11 0937   .  antiseptic oral rinse (BIOTENE) solution 15 mL  15 mL  Mouth Rinse  BID  Max Fickle, MD   15 mL at 04/13/11 0943   .  aspirin chewable tablet 324 mg  324 mg  Oral  NOW  Max Fickle, MD      Or   .  aspirin suppository 300 mg  300 mg  Rectal  NOW  Max Fickle, MD   300 mg at 03/27/11 0007   .  aspirin suppository 300 mg  300 mg  Rectal  NOW  Max Fickle, MD       .  chlorproMAZINE (THORAZINE) 25 mg in sodium chloride 0.9 % 25 mL IVPB  25 mg  Intravenous  Once  Rakesh V. Vassie Loll, MD   25 mg at 04/02/11 2140   .  chlorproMAZINE (THORAZINE) 25 mg in sodium chloride 0.9 % 25 mL IVPB  25 mg  Intravenous  Once  Shelba Flake, MD   25 mg at 04/03/11 0413   .  chlorproMAZINE (THORAZINE) 25 mg in sodium chloride 0.9 % 25 mL IVPB  25 mg  Intravenous  Once  Shelba Flake, MD   25 mg at 04/04/11 0507   .  cisatracurium (NIMBEX) bolus via infusion 3 mg  0.05 mg/kg  Intravenous  Once PRN  Max Fickle, MD     .  cisatracurium (NIMBEX) injection 6 mg  0.1 mg/kg  Intravenous  Once  Max Fickle, MD   6 mg at 03/26/11 2222   .  cisatracurium (NIMBEX) injection 6 mg  0.1 mg/kg  Intravenous  Once PRN  Max Fickle, MD     .  clonazePAM Scarlette Calico) tablet 0.5 mg  0.5 mg  Oral  Daily  Belkys Regalado, MD   0.5 mg at 04/13/11 0936   .  cloNIDine (CATAPRES - Dosed in mg/24 hr) patch 0.3 mg  0.3 mg  Transdermal  Weekly  Christian M Buettner, PHARMD   0.3 mg at 04/08/11 1340   .  dextrose 50 % solution       25 mL at 03/28/11 1029   .  diphenoxylate-atropine (LOMOTIL) 2.5-0.025 MG per tablet 1 tablet  1 tablet  Oral  Q4H PRN  Leslye Peer, MD   1 tablet at 04/07/11 1329   .  diphenoxylate-atropine (LOMOTIL) 2.5-0.025 MG per tablet 2 tablet  2 tablet  Oral  Once  Leslye Peer, MD   2 tablet at 04/06/11 1438   .  divalproex (DEPAKOTE) DR tablet 1,000 mg  1,000 mg  Oral  Q12H  Crystal Stillinger Robertson, PHARMD     .  famotidine (PEPCID) tablet 20 mg  20 mg  Oral  Daily  Jay K. Allena Katz, MD   20 mg at 04/13/11 0937   .  feeding supplement (ENSURE CLINICAL STRENGTH) liquid 237 mL  237 mL  Oral  BID  Waylan Boga Lamberton, RD   237 mL at 04/13/11 0943   .  fentaNYL (SUBLIMAZE) 0.05 MG/ML injection         .  fentaNYL (SUBLIMAZE) bolus via infusion 25 mcg  25 mcg  Intravenous  STAT  Ricki Rodriguez, MD   25 mcg at 03/27/11 1322   .  fentaNYL (SUBLIMAZE) bolus  via infusion 50 mcg  50 mcg  Intravenous  STAT  Ricki Rodriguez, MD   50 mcg at 03/27/11 1324   .  fentaNYL (SUBLIMAZE) injection 100 mcg  100 mcg  Intravenous  Once  Raeford Razor, MD   100 mcg at 03/26/11 2020   .  fentaNYL (SUBLIMAZE) injection   Intravenous  PRN  Abundio Miu, MD   25 mcg at 04/10/11 1021   .  ferrous sulfate tablet 325  mg  325 mg  Oral  TID WC  Belkys Regalado, MD   325 mg at 04/13/11 1208   .  furosemide (LASIX) 160 mg in dextrose 5 % 50 mL IVPB  160 mg  Intravenous  Q6H  Sadie Haber, MD   160 mg at 04/02/11 0515   .  furosemide (LASIX) 160 mg in dextrose 5 % 50 mL IVPB  160 mg  Intravenous  Q8H  Sadie Haber, MD   160 mg at 04/03/11 0259   .  furosemide (LASIX) 160 mg in dextrose 5 % 50 mL IVPB  160 mg  Intravenous  BID  Ardyth Gal, MD   160 mg at 04/04/11 0827   .  furosemide (LASIX) injection 80 mg  80 mg  Intravenous  Once  Maree Krabbe, MD   80 mg at 03/28/11 2355   .  furosemide (LASIX) injection 80 mg  80 mg  Intravenous  Once  Ardyth Gal, MD   80 mg at 03/29/11 1247   .  heparin injection 5,000 Units  5,000 Units  Subcutaneous  Q8H  Jay K. Allena Katz, MD   5,000 Units at 04/09/11 2118   .  hydrALAZINE (APRESOLINE) injection 10-40 mg  10-40 mg  Intravenous  Q4H PRN  Billy Fischer, MD   20 mg at 04/08/11 1404   .  hydrALAZINE (APRESOLINE) tablet 25 mg  25 mg  Oral  QID  Leslye Peer, MD   25 mg at 04/13/11 0936   .  influenza inactive virus vaccine (FLUZONE/FLUARIX) injection 0.5 mL  0.5 mL  Intramuscular  Tomorrow-1000  Max Fickle, MD     .  influenza inactive virus vaccine (FLUZONE/FLUARIX) injection 0.5 mL  0.5 mL  Intramuscular  Tomorrow-1000  Lora Poteet Seay, PHARMD   0.5 mL at 03/31/11 0911   .  labetalol (NORMODYNE,TRANDATE) injection 20 mg  20 mg  Intravenous  STAT  Ricki Rodriguez, MD   20 mg at 03/27/11 1330   .  labetalol (NORMODYNE,TRANDATE) injection 20 mg  20 mg  Intravenous  Q2H PRN  Bethel Born, MD   20 mg at 04/07/11 1829    .  LORazepam (ATIVAN) 2 MG/ML injection         .  LORazepam (ATIVAN) injection 2 mg  2 mg  Intravenous  Once  Raeford Razor, MD   2 mg at 03/26/11 2020   .  LORazepam (ATIVAN) injection 2 mg  2 mg  Intravenous  Q4H PRN  Billy Fischer, MD     .  methylPREDNISolone sodium succinate (SOLU-MEDROL) 500 mg in sodium chloride 0.9 % 50 mL IVPB  500 mg  Intravenous  Daily  Maree Krabbe, MD   500 mg at 03/30/11 1017   .  metoprolol (LOPRESSOR) tablet 100 mg  100 mg  Oral  BID  Leslye Peer, MD   100 mg at 04/13/11 0936   .  midazolam (VERSED) 2 MG/2ML injection         .  midazolam (VERSED) 5 MG/5ML injection   Intravenous  PRN  Abundio Miu, MD   1 mg at 04/10/11 1010   .  midazolam (VERSED) bolus via infusion 1 mg  1 mg  Intravenous  STAT  Ricki Rodriguez, MD   1 mg at 03/27/11 1322   .  midazolam (VERSED) bolus via infusion 1 mg  1 mg  Intravenous  STAT  Ricki Rodriguez, MD   1 mg  at 03/27/11 1336   .  ondansetron (ZOFRAN) injection 4 mg  4 mg  Intravenous  Q8H PRN  Shelba Flake, MD   4 mg at 04/08/11 1349   .  phenytoin (DILANTIN) 1,000 mg in sodium chloride 0.9 % 250 mL IVPB  1,000 mg  Intravenous  Once  Juliann Pulse, PHARMD   1,000 mg at 03/28/11 1424   .  potassium chloride 10 mEq in 100 mL IVPB  10 mEq  Intravenous  Q1 Hr x 2  Belkys Regalado, MD   10 mEq at 04/08/11 1636   .  potassium chloride 10 mEq in 50 mL *CENTRAL LINE* IVPB  10 mEq  Intravenous  Q1 Hr x 4  Max Fickle, MD   10 mEq at 04/02/11 1816   .  potassium chloride 20 MEQ/15ML (10%) liquid 40 mEq  40 mEq  Oral  Once  Everrett Coombe, DO   40 mEq at 04/08/11 1340   .  potassium chloride SA (K-DUR,KLOR-CON) 20 MEQ CR tablet         .  potassium chloride SA (K-DUR,KLOR-CON) CR tablet 40 mEq  40 mEq  Oral  Once  Everrett Coombe, DO   40 mEq at 04/09/11 1638   .  promethazine (PHENERGAN) injection 25 mg  25 mg  Intravenous  Once  Shelba Flake, MD   25 mg at 04/05/11 0221   .  propofol (DIPRIVAN) 10 MG/ML infusion   5-70 mcg/kg/min  Intravenous  Once  Raeford Razor, MD  7.2 mL/hr at 03/26/11 2126  20 mcg/kg/min at 03/26/11 2126   .  propofol (DIPRIVAN) 10 MG/ML infusion      14.4 mL/hr at 03/27/11 0013  1,000 mg at 03/27/11 0013   .  sodium chloride 0.9 % bolus 1,000 mL  1,000 mL  Intravenous  Once  Raeford Razor, MD   1,000 mL at 03/26/11 2010   .  sodium chloride 0.9 % bolus 1,000 mL  1,000 mL  Intravenous  Once  Raeford Razor, MD   1,000 mL at 03/26/11 2108   .  sodium chloride 0.9 % injection 10 mL  10 mL  Intracatheter  Q12H  Max Fickle, MD   10 mL at 04/10/11 2200   .  sodium chloride 0.9 % injection 10 mL  10 mL  Intracatheter  PRN  Max Fickle, MD   10 mL at 04/13/11 0539   .  sodium chloride 0.9 % injection 3 mL  3 mL  Intravenous  Q12H  Max Fickle, MD   3 mL at 04/10/11 2200   .  sodium polystyrene (KAYEXALATE) 15 GM/60ML suspension 30 g  30 g  Oral  Once  Ardyth Gal, MD   30 g at 03/29/11 1303   .  valproate (DEPACON) 1,000 mg in dextrose 5 % 50 mL IVPB  1,000 mg  Intravenous  Once  Aline Brochure   1,000 mg at 04/03/11 1106   .  valproate (DEPACON) 500 mg in dextrose 5 % 50 mL IVPB  500 mg  Intravenous  Once  Aline Brochure   500 mg at 04/04/11 1111   .  vancomycin (VANCOCIN) IVPB 1000 mg/200 mL premix  1,000 mg  Intravenous  Once  Raeford Razor, MD   1,000 mg at 03/26/11 2035   .  white petrolatum (VASELINE) gel       0.2 application at 04/05/11 0034   .  DISCONTD: 0.9 % sodium chloride infusion  250 mL  Intravenous  PRN  Max Fickle, MD  10 mL/hr at 03/29/11 0307  250 mL at 03/29/11 0307   .  DISCONTD: 0.9 % sodium chloride infusion   Intravenous  Continuous PRN  Max Fickle, MD  10 mL/hr at 04/02/11 0039    .  DISCONTD: 0.9 % sodium chloride infusion   Intravenous  Continuous  Billy Fischer, MD  15 mL/hr at 03/31/11 1800    .  DISCONTD: antiseptic oral rinse (BIOTENE) solution 15 mL  15 mL  Mouth Rinse  QID  Max Fickle, MD   15 mL at 04/03/11 1600   .   DISCONTD: artificial tears (LACRILUBE) ophthalmic ointment 1 application  1 application  Both Eyes  Q8H  Max Fickle, MD   1 application at 03/31/11 0631   .  DISCONTD: ceFEPIme (MAXIPIME) 1 g in dextrose 5 % 50 mL IVPB  1 g  Intravenous  Q12H  Raeford Razor, MD   1 g at 03/26/11 2047   .  DISCONTD: cefTRIAXone (ROCEPHIN) 1 g in dextrose 5 % 50 mL IVPB  1 g  Intravenous  Q24H  Eugene Garnet, PHARMD   1 g at 04/02/11 0031   .  DISCONTD: chlorhexidine (PERIDEX) 0.12 % solution 15 mL  15 mL  Mouth Rinse  BID  Max Fickle, MD   15 mL at 04/02/11 2030   .  DISCONTD: cisatracurium (NIMBEX) 200 mg in sodium chloride 0.9 % 200 mL infusion  1-1.5 mcg/kg/min  Intravenous  Continuous  Max Fickle, MD   1.5 mcg/kg/min at 03/28/11 0021   .  DISCONTD: clonazePAM (KLONOPIN) tablet 0.5 mg  0.5 mg  Oral  QID  Carin Hock Stewart   0.5 mg at 04/03/11 2127   .  DISCONTD: clonazePAM (KLONOPIN) tablet 0.5 mg  0.5 mg  Oral  Q6H  Charles R Stewart   0.5 mg at 04/06/11 1142   .  DISCONTD: cloNIDine (CATAPRES - Dosed in mg/24 hr) patch 0.1 mg  0.1 mg  Transdermal  Weekly  Billy Fischer, MD   0.1 mg at 04/05/11 1200   .  DISCONTD: cloNIDine (CATAPRES - Dosed in mg/24 hr) patch 0.3 mg  0.3 mg  Transdermal  Weekly  Leslye Peer, MD     .  DISCONTD: famotidine (PEPCID) 40 MG/5ML suspension 20 mg  20 mg  Per Tube  QHS  Severiano Gilbert, PHARMD   20 mg at 04/01/11 2128   .  DISCONTD: famotidine (PEPCID) IVPB 20 mg  20 mg  Intravenous  QHS  Max Fickle, MD   20 mg at 03/30/11 2230   .  DISCONTD: feeding supplement (NEPRO CARB STEADY) liquid 1,000 mL  1,000 mL  Oral  Continuous  Ardyth Gal, MD  20 mL/hr at 03/31/11 2208  1,000 mL at 03/31/11 2208   .  DISCONTD: feeding supplement (OSMOLITE 1.2 CAL) liquid 1,000 mL  1,000 mL  Oral  Continuous  Billy Fischer, MD  20 mL/hr at 03/28/11 1628  1,000 mL at 03/28/11 1628   .  DISCONTD: fentaNYL (SUBLIMAZE) 10 mcg/mL in sodium chloride 0.9 % 250 mL infusion  120  mcg/hr  Intravenous  Continuous  Colleen Can, PHARMD   120 mcg/hr at 03/27/11 1900   .  DISCONTD: fentaNYL (SUBLIMAZE) 10 mcg/mL in sodium chloride 0.9 % 250 mL infusion  50-400 mcg/hr  Intravenous  Titrated  Max Fickle, MD   25 mcg/hr at 04/02/11 1000   .  DISCONTD: fentaNYL (SUBLIMAZE) bolus  via infusion 25 mcg  25 mcg  Intravenous  Once  Robynn Pane, MD     .  DISCONTD: fentaNYL (SUBLIMAZE) bolus via infusion 50 mcg  50 mcg  Intravenous  STAT  Robynn Pane, MD     .  DISCONTD: fentaNYL (SUBLIMAZE) bolus via infusion 50-100 mcg  50-100 mcg  Intravenous  Q6H PRN  Max Fickle, MD   100 mcg at 04/01/11 2031   .  DISCONTD: fentaNYL (SUBLIMAZE) injection 3,000-6,000 mcg  50-100 mcg/kg  Intravenous  Continuous  Max Fickle, MD  12 mL/hr at 03/26/11 2219  120 mcg at 03/26/11 2219   .  DISCONTD: furosemide (LASIX) injection 40 mg  40 mg  Intravenous  Q6H  Max Fickle, MD     .  DISCONTD: furosemide (LASIX) injection 40 mg  40 mg  Intravenous  Q6H  Max Fickle, MD   40 mg at 04/02/11 0928   .  DISCONTD: furosemide (LASIX) tablet 120 mg  120 mg  Oral  BID  Sadie Haber, MD   120 mg at 04/12/11 0734   .  DISCONTD: hydrALAZINE (APRESOLINE) 20 MG/ML injection         .  DISCONTD: hydrALAZINE (APRESOLINE) injection 10 mg  10 mg  Intravenous  Q6H PRN  Max Fickle, MD   10 mg at 04/04/11 0207   .  DISCONTD: hydrALAZINE (APRESOLINE) injection 10 mg  10 mg  Intravenous  Q8H  Max Fickle, MD   10 mg at 04/04/11 0541   .  DISCONTD: hydrALAZINE (APRESOLINE) injection 10 mg  10 mg  Intravenous  Q6H  Billy Fischer, MD   10 mg at 04/05/11 1202   .  DISCONTD: hydrALAZINE (APRESOLINE) injection 10-20 mg  10-20 mg  Intravenous  Q4H PRN  Billy Fischer, MD   10 mg at 03/27/11 0251   .  DISCONTD: hydrALAZINE (APRESOLINE) injection 10-40 mg  10-40 mg  Intravenous  Q4H PRN  Billy Fischer, MD   20 mg at 03/30/11 2102   .  DISCONTD: hydrALAZINE (APRESOLINE) injection 20 mg  20 mg   Intravenous  Q6H  Billy Fischer, MD   20 mg at 04/06/11 1142   .  DISCONTD: insulin aspart (novoLOG) injection 0-15 Units  0-15 Units  Subcutaneous  Q4H  Shelba Flake, MD   2 Units at 03/31/11 0035   .  DISCONTD: labetalol (NORMODYNE) tablet 200 mg  200 mg  Per Tube  BID  Sadie Haber, MD   200 mg at 03/31/11 2224   .  DISCONTD: labetalol (NORMODYNE) tablet 300 mg  300 mg  Per Tube  BID  Max Fickle, MD   300 mg at 04/01/11 2128   .  DISCONTD: labetalol (NORMODYNE) tablet 400 mg  400 mg  Per Tube  BID  Max Fickle, MD   400 mg at 04/02/11 0926   .  DISCONTD: labetalol (NORMODYNE,TRANDATE) 4 mg/mL in dextrose 5 % 125 mL infusion  1 mg/min  Intravenous  Titrated  Billy Fischer, MD  300 mL/hr at 03/27/11 1330  20 mg/min at 03/27/11 1330   .  DISCONTD: labetalol (NORMODYNE,TRANDATE) injection 20 mg  20 mg  Intravenous  Q10 min PRN  Billy Fischer, MD   20 mg at 03/26/11 2354   .  DISCONTD: labetalol (NORMODYNE,TRANDATE) injection 20 mg  20 mg  Intravenous  STAT  Robynn Pane, MD     .  DISCONTD: labetalol (NORMODYNE,TRANDATE) injection 20 mg  20 mg  Intravenous  Q2H PRN  Max Fickle, MD   20 mg at 04/01/11 1730   .  DISCONTD: LORazepam (ATIVAN) injection 2 mg  2 mg  Intravenous  Q2H PRN  Tammy Parrett, NP   2 mg at 04/03/11 0859   .  DISCONTD: LORazepam (ATIVAN) injection 2-4 mg  2-4 mg  Intravenous  Q4H PRN  Max Fickle, MD   2 mg at 04/04/11 0450   .  DISCONTD: metoprolol (LOPRESSOR) injection 5 mg  5 mg  Intravenous  Q6H  Max Fickle, MD   5 mg at 04/04/11 0541   .  DISCONTD: metoprolol (LOPRESSOR) tablet 50 mg  50 mg  Oral  BID  Rakesh V. Vassie Loll, MD   50 mg at 04/06/11 0915   .  DISCONTD: metoprolol tartrate (LOPRESSOR) tablet 25 mg  25 mg  Oral  BID  Billy Fischer, MD   25 mg at 04/04/11 2154   .  DISCONTD: metroNIDAZOLE (FLAGYL) IVPB 500 mg  500 mg  Intravenous  Q8H  Raeford Razor, MD   500 mg at 03/26/11 2049   .  DISCONTD: midazolam (VERSED) 1 mg/mL in sodium  chloride 0.9 % 50 mL infusion  4 mg/hr  Intravenous  Continuous  Billy Fischer, MD   4 mg/hr at 03/27/11 2236   .  DISCONTD: midazolam (VERSED) bolus via infusion 1 mg  1 mg  Intravenous  Once  Robynn Pane, MD     .  DISCONTD: midazolam (VERSED) bolus via infusion 1 mg  1 mg  Intravenous  Once  Robynn Pane, MD     .  DISCONTD: midazolam (VERSED) injection 1-2 mg  1-2 mg  Intravenous  Q2H PRN  Bethel Born, MD   2 mg at 03/30/11 1839   .  DISCONTD: moxifloxacin (AVELOX) IVPB 400 mg  400 mg  Intravenous  Q24H  Max Fickle, MD     .  DISCONTD: moxifloxacin (AVELOX) IVPB 400 mg  400 mg  Intravenous  Q24H  Eugene Garnet, PHARMD   400 mg at 03/27/11 2304   .  DISCONTD: nitroGLYCERIN (NITROGLYN) 2 % ointment 1 inch  1 inch  Topical  Q6H PRN  Max Fickle, MD   1 inch at 04/02/11 1705   .  DISCONTD: nitroGLYCERIN (NITROGLYN) 2 % ointment 1 inch  1 inch  Topical  Q6H  Max Fickle, MD   1 inch at 04/04/11 0542   .  DISCONTD: norepinephrine (LEVOPHED) 4,000 mcg in dextrose 5 % 250 mL infusion  0.5-10 mcg/min  Intravenous  Titrated  Billy Fischer, MD   2 mcg/min at 03/29/11 0530   .  DISCONTD: oseltamivir (TAMIFLU) 6 MG/ML suspension 75 mg  75 mg  Oral  BID  Eugene Garnet, PHARMD     .  DISCONTD: oseltamivir (TAMIFLU) 6 MG/ML suspension 75 mg  75 mg  Per Tube  BID  Max Fickle, MD   75 mg at 03/26/11 2330   .  DISCONTD: oseltamivir (TAMIFLU) capsule 75 mg  75 mg  Oral  BID  Max Fickle, MD     .  DISCONTD: pantoprazole (PROTONIX) EC tablet 40 mg  40 mg  Oral  Q1200  Leslye Peer, MD   40 mg at 04/08/11 0932   .  DISCONTD: pantoprazole (PROTONIX) injection 40 mg  40 mg  Intravenous  Q12H  Billy Fischer, MD   40 mg at 04/06/11 0915   .  DISCONTD: phenytoin (DILANTIN) injection 100 mg  100 mg  Intravenous  Q8H  Juliann Pulse, PHARMD     .  DISCONTD: phenytoin (DILANTIN) injection 100 mg  100 mg  Intravenous  Q8H  Juliann Pulse, PHARMD   100 mg at 03/31/11 4540   .  DISCONTD:  phenytoin (DILANTIN) injection 100 mg  100 mg  Intravenous  BID  Severiano Gilbert, PHARMD     .  DISCONTD: phenytoin (DILANTIN) injection 100 mg  100 mg  Intravenous  QHS  Severiano Gilbert, PHARMD   100 mg at 04/02/11 2305   .  DISCONTD: potassium chloride 20 MEQ/15ML (10%) liquid 40 mEq  40 mEq  Per Tube  Once  Everrett Coombe, DO     .  DISCONTD: promethazine (PHENERGAN) injection 12.5 mg  12.5 mg  Intravenous  Q6H PRN  Mcarthur Rossetti. Tyson Alias, MD   12.5 mg at 04/07/11 1326   .  DISCONTD: propofol (DIPRIVAN) 10 MG/ML infusion  5-70 mcg/kg/min  Intravenous  Titrated  Tammy Parrett, NP   10 mcg/kg/min at 03/30/11 1854   .  DISCONTD: sodium bicarbonate tablet 1,300 mg  1,300 mg  Oral  TID  Ardyth Gal, MD   1,300 mg at 04/02/11 1705   .  DISCONTD: sodium chloride 0.9 % injection 10 mL  10 mL  Intravenous  PRN  Max Fickle, MD   10 mL at 04/08/11 1804   .  DISCONTD: valproate (DEPACON) 1,000 mg in dextrose 5 % 50 mL IVPB  1,000 mg  Intravenous  Q12H  Charles R Stewart   1,000 mg at 04/13/11 9811   .  DISCONTD: valproate (DEPACON) 500 mg in dextrose 5 % 50 mL IVPB  500 mg  Intravenous  Q12H  Charles R Stewart   500 mg at 04/04/11 0541   .  DISCONTD: valproate (DEPACON) 750 mg in dextrose 5 % 50 mL IVPB  750 mg  Intravenous  Q12H  Charles R Stewart   750 mg at 04/04/11 2156   .  DISCONTD: vancomycin (VANCOCIN) 750 mg in sodium chloride 0.9 % 150 mL IVPB  750 mg  Intravenous  Q24H  Juliann Pulse, PHARMD     .  DISCONTD: vancomycin (VANCOCIN) IVPB 1000 mg/200 mL premix  1,000 mg  Intravenous  Q24H  Eugene Garnet, PHARMD   1,000 mg at 03/27/11 1925    No current outpatient prescriptions on file as of 04/13/2011.    Home:  Home Living  Lives With: Family  Receives Help From: Family  Type of Home: House  Home Layout: One level  Home Access: Stairs to enter  Entrance Stairs-Rails: None  Entrance Stairs-Number of Steps: 2  Bathroom Shower/Tub: Manufacturing engineer: Yes  How Accessible: Accessible via wheelchair  Home Adaptive Equipment: None  Functional History:  Prior Function  Level of Independence: Independent with basic ADLs;Independent with gait;Independent with transfers  Driving: Yes  Vocation: Student  Functional Status:  Mobility:  Bed Mobility  Bed Mobility: Yes  Rolling Right: 3: Mod assist  Rolling Right Details (indicate cue type and reason): pt able to reach across body to grab onto rail to help with roll, pt has myoclonus throughout entire motion; pt = 70%  Rolling Left: 3: Mod assist  Rolling Left Details (indicate cue type and reason): pt = 70%  Supine to Sit: 2: Max assist  Supine to Sit Details (indicate cue type and reason): pt = 50%, pt doesnt have much trunk  musculature and again has that same myoclonus  Sitting - Scoot to Edge of Bed: 3: Mod assist  Sitting - Scoot to Edge of Bed Details (indicate cue type and reason): pt = 50%  Transfers  Transfers: Yes  Sit to Stand: 1: +2 Total assist  Sit to Stand Details (indicate cue type and reason): pt = 20%; was able to help with initial stand but then myoclonus takes over and is unable to stand up at all  Stand to Sit: 1: +2 Total assist  Stand to Sit Details: pt = 20%  Squat Pivot Transfers: 1: +2 Total assist;Patient percentage (comment)  Squat Pivot Transfer Details (indicate cue type and reason): pt = 20% with transfer, has myoclonus during movement.  Ambulation/Gait  Ambulation/Gait: No  Stairs: No  Wheelchair Mobility  Wheelchair Mobility: No  ADL:  ADL  Eating/Feeding: Performed;Maximal assistance  Where Assessed - Eating/Feeding: Bed level  Grooming: Performed;Wash/dry face;Moderate assistance  Where Assessed - Grooming: Supine, head of bed up  Upper Body Bathing: Simulated;+1 Total assistance  Where Assessed - Upper Body Bathing: Sitting, bed  Lower Body Bathing: Simulated;+1 Total assistance  Where Assessed - Lower Body Bathing:  Sitting, bed  Upper Body Dressing: Simulated;Maximal assistance  Where Assessed - Upper Body Dressing: Sitting, bed  Lower Body Dressing: Simulated;+1 Total assistance  Where Assessed - Lower Body Dressing: Sitting, bed  Ambulation Related to ADLs: Pt is non ambulatory at this point.  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, decreased proximal strength, and myoclonus of LEs when seated  Cognition:  Cognition  Overall Cognitive Status: Impaired  Arousal/Alertness: Lethargic  Orientation Level: Oriented X4  Attention: Focused;Sustained  Focused Attention: Appears intact  Sustained Attention: Impaired  Sustained Attention Impairment: Verbal complex  Memory: Impaired  Memory Impairment: Decreased short term memory;Decreased recall of new information;Retrieval deficit  Decreased Short Term Memory: Verbal basic  Awareness: Impaired  Awareness Impairment: Intellectual impairment  Problem Solving: Impaired  Problem Solving Impairment: Verbal complex;Functional complex  Executive Function: Reasoning;Self Monitoring;Self Correcting  Reasoning: Impaired  Reasoning Impairment: Verbal complex;Functional complex  Self Monitoring: Impaired  Self Monitoring Impairment: Verbal complex  Self Correcting: Impaired  Self Correcting Impairment: Verbal complex  Behaviors: (? mild flat affect)  Safety/Judgment: Impaired  Cognition  Arousal/Alertness: Lethargic  Overall Cognitive Status: Impaired  Attention: Impaired  Current Attention Level: Sustained  Memory: Appears impaired  Memory Deficits: confuses events of the past several days  Orientation Level: Oriented X4  Blood pressure 157/88, pulse 77, temperature 97.4 F (36.3 C), temperature source Oral, resp. rate 18, height 5\' 2"  (1.575 m), weight 69.536 kg (153 lb 4.8 oz), last menstrual period 03/02/2011, SpO2 98.00%.  Physical Exam  Constitutional: She appears well-developed.  HENT: Acne over the face. Dentition fair.  Extraocular eye movements intact. Pupils equally round and reactive to light Head: Normocephalic.  Neck: Normal range of motion. Neck supple. No thyromegaly present.  Cardiovascular: Normal rate and regular rhythm.  Pulmonary/Chest: Effort normal. She has no wheezes.  Abdominal: Soft. She exhibits no distension. There is no tenderness.  Lymphadenopathy:   pretibial edema is decreased to trace to 1+.  Neurological: She is alert. Oriented to person and date of birth. She could tell me she was in the hospital with cues.  She knew the name of the hospital with Dr. Time.she states she was here because of problems with her memory. Cannot recall the date even with visual cues. Very slow in processing simple commands. Limited awareness and  insight. Affect very flat. No gross CN deficits. Myoclonic jerks noted occasionally but decreased from exam last week. DTR's 3+ right greater than left.  A few beats of clonus were seen in both lower extremities. Sensory 1/2 in legs below knees. UES grossly 2+ to 3/5 proximally to 4/5 distally. LES 2-/5 proximal to 3/5 distally.  Psychiatric:  Affect is flat  Results for orders placed during the hospital encounter of 03/26/11 (from the past 48 hour(s))   CBC Status: Abnormal    Collection Time    04/12/11 5:00 AM   Component  Value  Range  Comment    WBC  10.6 (*)  4.0 - 10.5 (K/uL)     RBC  2.87 (*)  3.87 - 5.11 (MIL/uL)     Hemoglobin  8.3 (*)  12.0 - 15.0 (g/dL)     HCT  81.1 (*)  91.4 - 46.0 (%)     MCV  88.5  78.0 - 100.0 (fL)     MCH  28.9  26.0 - 34.0 (pg)     MCHC  32.7  30.0 - 36.0 (g/dL)     RDW  78.2  95.6 - 15.5 (%)     Platelets  311  150 - 400 (K/uL)    RENAL FUNCTION PANEL Status: Abnormal    Collection Time    04/12/11 5:00 AM   Component  Value  Range  Comment    Sodium  141  135 - 145 (mEq/L)     Potassium  3.8  3.5 - 5.1 (mEq/L)     Chloride  106  96 - 112 (mEq/L)     CO2  25  19 - 32 (mEq/L)     Glucose, Bld  83  70 - 99 (mg/dL)     BUN   73 (*)  6 - 23 (mg/dL)     Creatinine, Ser  2.13 (*)  0.50 - 1.10 (mg/dL)     Calcium  7.8 (*)  8.4 - 10.5 (mg/dL)     Phosphorus  4.4  2.3 - 4.6 (mg/dL)     Albumin  1.6 (*)  3.5 - 5.2 (g/dL)     GFR calc non Af Amer  35 (*)  >90 (mL/min)     GFR calc Af Amer  40 (*)  >90 (mL/min)    RENAL FUNCTION PANEL Status: Abnormal    Collection Time    04/13/11 5:00 AM   Component  Value  Range  Comment    Sodium  141  135 - 145 (mEq/L)     Potassium  3.8  3.5 - 5.1 (mEq/L)     Chloride  106  96 - 112 (mEq/L)     CO2  25  19 - 32 (mEq/L)     Glucose, Bld  92  70 - 99 (mg/dL)     BUN  72 (*)  6 - 23 (mg/dL)     Creatinine, Ser  0.86 (*)  0.50 - 1.10 (mg/dL)     Calcium  7.9 (*)  8.4 - 10.5 (mg/dL)     Phosphorus  5.1 (*)  2.3 - 4.6 (mg/dL)     Albumin  1.5 (*)  3.5 - 5.2 (g/dL)     GFR calc non Af Amer  36 (*)  >90 (mL/min)     GFR calc Af Amer  41 (*)  >90 (mL/min)    CBC Status: Abnormal    Collection Time    04/13/11 5:00 AM  Component  Value  Range  Comment    WBC  8.5  4.0 - 10.5 (K/uL)     RBC  2.86 (*)  3.87 - 5.11 (MIL/uL)     Hemoglobin  8.5 (*)  12.0 - 15.0 (g/dL)     HCT  53.6 (*)  64.4 - 46.0 (%)     MCV  89.2  78.0 - 100.0 (fL)     MCH  29.7  26.0 - 34.0 (pg)     MCHC  33.3  30.0 - 36.0 (g/dL)     RDW  03.4  74.2 - 15.5 (%)     Platelets  319  150 - 400 (K/uL)     No results found.  Post Admission Physician Evaluation:  1. Functional deficits secondary to cardiorespiratory arrest with subsequent anoxic brain injury 2. Patient is admitted to receive collaborative, interdisciplinary care between the physiatrist, rehab nursing staff, and therapy team. 3. Patient's level of medical complexity and substantial therapy needs in context of that medical necessity cannot be provided at a lesser intensity of care such as a SNF. 4. Patient has experienced substantial functional loss from his/her baseline which was documented above under the "Functional History" and "Functional Status"  headings. Judging by the patient's diagnosis, physical exam, and functional history, the patient has potential for functional progress which will result in measurable gains while on inpatient rehab. These gains will be of substantial and practical use upon discharge in facilitating mobility and self-care at the household level. 5. Physiatrist will provide 24 hour management of medical needs as well as oversight of the therapy plan/treatment and provide guidance as appropriate regarding the interaction of the two. 6. 24 hour rehab nursing will assist with bladder management, bowel management, safety, skin/wound care, disease management, medication administration, pain management and patient education and help integrate therapy concepts, techniques,education, etc. 7. PT will assess and treat for: Lower extremity strength, neuromuscular reeducation, adaptive equipment training, safety, gait and transfers, cognitive perceptual training. Goals are: Minimal assistance. 8. OT will assess and treat for: Upperextremitystrength,ADLs,neuromuscularreeducation,safety,functionalmobility, cognitive perceptual training. Goals are: Minimal to moderate assistance. 9. SLP will assess and treat for: Speech intelligibility in cognition. Goals are: Supervision to minimal assistance. 10. Case Management and Social Worker will assess and treat for psychological issues and discharge planning. 11. Team conference will be held weekly to assess progress toward goals and to determine barriers to discharge. 12. Patient will receive at least 3 hours of therapy per day at least 5 days per week. 13. 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  2. DVT Prophylaxis/Anticoagulation: SCDs. Venous Doppler studies were negative  3. Seizure disorder. Valproate 1000 mg every 12 hours. Latest valproic acid level January 15 of 42.6. Monitor closely for any increased  seizure activity. 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  4. Hypertension. Norvasc 2.5 mg daily, clonidine patch 0.3 mg change weekly, hydralazine 25 mg 4 times daily, Lopressor 100 mg twice daily. Monitor closely with increased activity  5 Renal failure. 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 Await renal biopsy completed January 18.. Strict I and O.'s 6. Nutrition: Continue to encourage intake. Consider appetite stimulant. Valproic acid should help with appetite as well.

## 2011-04-13 NOTE — Discharge Summary (Signed)
Admit date: 03/26/2011 Discharge date: 04/13/2011  Primary Care Physician:  No primary provider on file.   Discharge Diagnoses:   1. VDRF - post arrest, resolved.  2. Cardiac - Cardiomyopathy with reduced systolic function, EF 40-45% (Echo 03/27/2011). Suspect HTN-cardiomyopathy. Was flu negative, ? 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 Renal: AKI, Could be due to malig HTN, or glomerular disease, pathology report pending. 4.PNA, Resolved. 5. Seizures, myoclonus.  6.Malignant Hypertension:  7Perinephric Hematoma: 8. Anemia, multifactorial, acute illness, chronic diseases. 9.Hypoxic encephalopathy,   DISCHARGE MEDICATION: Current Discharge Medication List    CONTINUE these medications which have NOT CHANGED   Details  albuterol (PROVENTIL HFA;VENTOLIN HFA) 108 (90 BASE) MCG/ACT inhaler Inhale 1 puff into the lungs every 4 (four) hours as needed. For shortness of breath.            Consults:  Pulmonary, CCM                     Harwani, MD.    SIGNIFICANT DIAGNOSTIC STUDIES:  Dg Abd 1 View  04/05/2011  **ADDENDUM** CREATED: 04/05/2011 10:00:38  The "rectal device" described on plain film is likely a Foley catheter, upon discussion with the patient's nurse.  **END ADDENDUM** SIGNED BY: Karn Cassis. Reche Dixon, M.D.    04/05/2011  *RADIOLOGY REPORT*  Clinical Data: Projectile vomiting.  ABDOMEN - 1 VIEW  Comparison: None.  Findings: Single supine view of the abdomen and pelvis.  Mild to moderate gaseous distention of the stomach.  The far right abdomen is excluded.  Paucity of small bowel gas.  Distal rectal gas, with rectal device in place.  No pneumatosis or free intraperitoneal air.  IMPRESSION: Nonspecific bowel gas pattern, with mild to moderate gaseous distention of the stomach.  Partial exclusion of the far right side of the abdomen.  Original Report Authenticated By: Consuello Bossier, M.D.   Ct Head Wo Contrast  03/28/2011  *RADIOLOGY REPORT*  Clinical Data: Altered mental status.  Seizures.   Recent cardiopulmonary resuscitation.  CT HEAD WITHOUT CONTRAST  Technique:  Contiguous axial images were obtained from the base of the skull through the vertex without contrast.  Comparison: 03/26/2011.  Findings: The cerebral hemispheres and posterior fossa structures continue to have normal appearances.  Hemorrhage, mass lesion or CT evidence of acute infarction.  The ventricles remain normal in size and position.  Interval mucosal thickening and probable retained secretions in the posterior sphenoid sinus on the left.  IMPRESSION:  Interval left sphenoid sinusitis.  Otherwise, unremarkable examination.  Original Report Authenticated By: Darrol Angel, M.D.   Ct Head Wo Contrast  03/26/2011  *RADIOLOGY REPORT*  Clinical Data: Sudden cardiac arrest of unknown etiology.  CT HEAD WITHOUT CONTRAST  Technique:  Contiguous axial images were obtained from the base of the skull through the vertex without contrast.  Comparison: None.  Findings: The ventricles and sulci are symmetrical.  No mass effect or midline shift.  No abnormal extra-axial fluid collections. Ventricles are not dilated.  Gray-white matter junctions are distinct.  Basal cisterns are not effaced.  No evidence of acute intracranial hemorrhage.  No depressed skull fractures.  Paranasal sinuses are not opacified.  IMPRESSION: No evidence of acute intracranial hemorrhage, mass lesion, or acute infarct.  Original Report Authenticated By: Marlon Pel, M.D.   Mr Brain Wo Contrast  04/02/2011  *RADIOLOGY REPORT*  Clinical Data: Stroke.  Cardiac arrest.  MRI HEAD WITHOUT CONTRAST  Technique:  Multiplanar, multiecho pulse sequences of the brain and surrounding structures were  obtained according to standard protocol without intravenous contrast.  Comparison: CT head without contrast 03/26/2011 and 03/28/2011 and [facility].  Findings: No acute intracranial abnormalities are present. Specifically, there is no evidence for acute infarct,  hemorrhage, mass, hydrocephalus, or significant extra-axial fluid collection. Flow is present in the major intracranial arteries.  The globes and orbits are intact.  The paranasal sinuses are clear.  There is minimal fluid in the mastoid air cells bilaterally. Fluid and mucosal thickening is present in the left sphenoid sinus.  This is likely secondary to the patient's intubated status.  The study is mildly degraded by patient motion.  Marrow signal is diminished within the upper cervical spine.  IMPRESSION:  1.  Normal MRI appearance of the brain. 2.  Minimal fluid in the mastoid air cells and left sphenoid sinus, likely secondary to the patient's intubated status. 3.  Decreased marrow signal, compatible with anemia. 4.  The study is moderately degraded by patient motion.  Original Report Authenticated By: Jamesetta Orleans. MATTERN, M.D.   Mr Cervical Spine Wo Contrast  04/02/2011  *RADIOLOGY REPORT*  Clinical Data: Stroke.  MRI CERVICAL SPINE WITHOUT CONTRAST  Technique:  Multiplanar and multiecho pulse sequences of the cervical spine, to include the craniocervical junction and cervicothoracic junction, were obtained according to standard protocol without intravenous contrast.  Comparison: None  Findings: The visualized intracranial structures are normal. Cervical spinal cord is normal.  There is no mass, myelopathy, or spinal cord edema to suggest infarct.  The discs are normal throughout the cervical spine.  Osseous structures are normal.  Soft tissues are normal.  IMPRESSION: Normal MRI of the cervical spine.  Original Report Authenticated By: Gwynn Burly, M.D.   Mr Thoracic Spine Wo Contrast  04/02/2011  *RADIOLOGY REPORT*  Clinical Data: Stroke.  MRI THORACIC SPINE WITHOUT CONTRAST  Technique:  Multiplanar and multiecho pulse sequences of the thoracic spine were obtained without intravenous contrast.  Comparison: None  Findings: The thoracic spinal cord is normal with no mass lesion, myelopathy, or  evidence of infarct. Osseous structures are normal. Discs are normal.  Incidental note is made of cardiomegaly.  IMPRESSION: Normal MRI of the thoracic spine.  Cardiomegaly.  Original Report Authenticated By: Gwynn Burly, M.D.   Mr Lumbar Spine Wo Contrast  04/02/2011  *RADIOLOGY REPORT*  Clinical Data: Stroke.  MRI LUMBAR SPINE WITHOUT CONTRAST  Technique:  Multiplanar and multiecho pulse sequences of the lumbar spine were obtained without intravenous contrast.  Comparison: None.  Findings: The tip of the conus is at L2 and appears normal.  Distal spinal cord is normal.  Discs throughout the lumbar spine are normal.  No osseous abnormality.  There is a small amount of free fluid in the right side of the pelvis.  Multiple follicular cysts on both ovaries.  IMPRESSION: Normal MRI of the lumbar spine.  Original Report Authenticated By: Gwynn Burly, M.D.   US Renal  03/29/2011  *RADIOLOGY REPORT*  Clinical Data: Acute on chronic renal failure  RENAL/URINARY TRACT ULTRASOUND COMPLETE  Comparison:  None.  Findings:  Right Kidney:  Measures 11.9 cm.  Echogenic renal parenchyma, likely reflecting medical renal disease.  No hydronephrosis.  Left Kidney:  Measures 12.6 cm.  Echogenic renal parenchyma, likely reflecting medical renal disease.  No hydronephrosis.  Bladder:  Decompressed by indwelling Foley catheter.  IMPRESSION: No hydronephrosis.  Echogenic renal parenchyma, likely reflecting medical renal disease.  Original Report Authenticated By: Charline Bills, M.D.   US Biopsy  04/10/2011  *  RADIOLOGY REPORT*  Clinical history:21 year old with proteinuria.  PROCEDURE(S): ULTRASOUND GUIDED RIGHT RENAL BIOPSY  Physician: Rachelle Hora. Henn, MD  Medications:Versed 1 mg, Fentanyl 50 mcg. A radiology nurse monitored the patient for moderate sedation.  Moderate sedation time:30 minutes  Fluoroscopy time: None  Procedure:The procedure was explained to the patient.  The risks and benefits of the procedure were  discussed and the patient's questions were addressed.  Informed consent was obtained from the patient.  The patient was placed prone and both kidneys were evaluated.  The right flank was prepped and draped in a sterile fashion.  The skin was anesthetized with 1% lidocaine.  A 16 gauge core needle was directed into the lower pole cortex with ultrasound guidance.  Three core biopsies were obtained.  The second and third core biopsies were adequate.  Samples were placed in saline. Following the third core biopsy, there was immediate development of a perinephric hematoma.  Active bleeding was identified within the hematoma.  The patient was observed with ultrasound for approximately 10 minutes.  Eventually, the blood flow within the perinephric hematoma stopped along with the bleeding from the kidney.  The patient was left in the prone position and monitored in the radiology nursing area for 1 hour.  The patient's vital signs were stable.  The patient was then returned to the floor.  Findings:There is fullness in the renal collecting systems bilaterally, left side greater than right.  Biopsies obtained from the right kidney lower pole.  Following the third core biopsy, there was immediate development of a perinephric hematoma.  The area of active bleeding was easily identified with ultrasound. After 10 minutes of observation, the area of active bleeding resolved and there was no longer blood flow within the perinephric hematoma.  Complications: Development of a right perinephric hematoma.  Impression:Ultrasound guided core biopsies of the right kidney.  Development of a right perinephric hematoma following the core biopsies.  The area of bleeding was visualized with ultrasound. The bleeding resolved within 10 minutes.  The patient will be put on bedrest for 24 hours.  Fullness of the collecting systems, left side greater than right.  Original Report Authenticated By: Richarda Overlie, M.D.   Dg Chest Port 1 View  04/07/2011   *RADIOLOGY REPORT*  Clinical Data: Left lower lobe consolidation.  PORTABLE CHEST - 1 VIEW  Comparison: 04/04/2011  Findings: Left PICC appears in good position.  There is slight improvement in the consolidation at the left lung base.  The right lung is clear.  Persistent cardiomegaly.  Vascularity is normal.  IMPRESSION: Slight improvement in the consolidation in the left lower lobe.  Original Report Authenticated By: Gwynn Burly, M.D.   Dg Chest Port 1 View  04/04/2011  *RADIOLOGY REPORT*  Clinical Data: Pulmonary edema  PORTABLE CHEST - 1 VIEW  Comparison: Yesterday  Findings: Moderate cardiomegaly.  Left PICC stable.  Low volumes. No pneumothorax.  Left basilar consolidation worse.  IMPRESSION: Worsening left basilar consolidation.  Original Report Authenticated By: Donavan Burnet, M.D.   Dg Chest Port 1 View  04/03/2011  *RADIOLOGY REPORT*  Clinical Data: Dyspnea.  PORTABLE CHEST - 1 VIEW  Comparison: 04/03/2011  Findings: Interval removal of right IJ central line.  Moderate cardiomegaly.  Possible small left pleural effusion. No pneumothorax.  Improved interstitial edema, with mild venous congestion remaining.  Improved right base aeration.  Persistent left base air space disease.  IMPRESSION: 1.  Cardiomegaly with low lung volumes and improved interstitial edema/pulmonary venous congestion. 2.  Probable small left pleural effusion with persistent left base atelectasis versus infection.  Original Report Authenticated By: Consuello Bossier, M.D.   Dg Chest Port 1 View  04/03/2011  *RADIOLOGY REPORT*  Clinical Data: Respiratory failure.  PORTABLE CHEST - 1 VIEW  Comparison: 04/02/2011  Findings: Interval extubation and removal of NG tube.  Right central line is unchanged.  Stable cardiomegaly.  Increasing bibasilar opacities, likely atelectasis.  Possible small bilateral effusions.  Mild vascular congestion and interstitial prominence throughout the lungs, likely mild edema.  IMPRESSION: Interval  extubation.  Continued mild edema.  Increasing bibasilar atelectasis following extubation.  Original Report Authenticated By: Cyndie Chime, M.D.   Dg Chest Port 1 View  04/02/2011  *RADIOLOGY REPORT*  Clinical Data: Endotracheal tube.  PORTABLE CHEST - 1 VIEW  Comparison: 04/01/2011.  Findings: Endotracheal tube is in satisfactory position. Nasogastric tube is followed into the stomach.  Right IJ central line tip projects over the SVC.  Heart is enlarged, stable.  There is mild diffuse bilateral air space disease.  Left lower lobe collapse/consolidation persists. Probable left pleural effusion.  IMPRESSION:  1.  Mild diffuse bilateral air space disease is likely due to edema. 2.  Left lower lobe collapse/consolidation. 3.  Probable small left pleural effusion.  Original Report Authenticated By: Reyes Ivan, M.D.   Dg Chest Port 1 View  04/01/2011  *RADIOLOGY REPORT*  Clinical Data: 21 year old female with respiratory failure.  Heart failure, renal injury.  PORTABLE CHEST - 1 VIEW  Comparison: 03/31/2011 and earlier.  Findings: AP portable semi upright view 0554 hours.  Endotracheal tube tip is between clavicles and carina. Stable cardiomegaly and mediastinal contours.  No pneumothorax.  Probable left pleural effusion.  Pulmonary vascular congestion has improved since 03/27/2011.  Enteric tube courses into the abdomen.  Stable right IJ central line.  IMPRESSION: 1.  Satisfactory placement of endotracheal tube and visible NG tube. 2.  Cardiomegaly and vascular congestion with some improvement since 03/27/2011. 3.  Left pleural effusion.  Original Report Authenticated By: Harley Hallmark, M.D.   Dg Chest Port 1 View  03/31/2011  *RADIOLOGY REPORT*  Clinical Data: Endotracheal tube placement.  Intubated patient.  PORTABLE CHEST - 1 VIEW  Comparison: 03/30/2011.  Findings: Endotracheal tube 54 mm from the carina.  Right IJ central line appears unchanged.  Enteric tube is present with the tip not visualized.   Cardiomegaly.  Patient rotated to the left. Basilar atelectasis remains present.  Improved pulmonary aeration with less pulmonary edema than was present on yesterday's examination.  IMPRESSION:  1.  Stable support apparatus. 2.  Improving pulmonary aeration. 3.  Unchanged cardiomegaly.  Original Report Authenticated By: Andreas Newport, M.D.   Dg Chest Port 1 View  03/30/2011  *RADIOLOGY REPORT*  Clinical Data: Intubated.  PORTABLE CHEST - 1 VIEW  Comparison: 03/29/2011  Findings: Support devices are unchanged.  There is cardiomegaly. Bilateral lower lobe airspace opacities, left greater than right suspected small bilateral effusions.  No real change since prior study.  IMPRESSION: No interval change.  Original Report Authenticated By: Cyndie Chime, M.D.   Dg Chest Port 1 View  03/29/2011  *RADIOLOGY REPORT*  Clinical Data: Antegrade, pneumonia  PORTABLE CHEST - 1 VIEW  Comparison: 03/28/2011  Findings: Endotracheal tube at the carina directed to the right main stem.  This should be retracted 3 cm.  NG tube in the stomach. Right IJ central line in the SVC region.  Heart remains enlarged with dense left lower lobe consolidation/collapse.  No significant interval change.  No pneumothorax.  IMPRESSION: Low endotracheal tube at the carina directed to the right main stem should be retracted 3 cm.  Otherwise stable chest exam.  Findings called to [facility], patient's nurse on 2900.  Original Report Authenticated By: Judie Petit. Ruel Favors, M.D.   Dg Chest Port 1 View  03/28/2011  *RADIOLOGY REPORT*  Clinical Data: Ventilator dependent  PORTABLE CHEST - 1 VIEW  Comparison: 03/26/2010  Findings: Endotracheal tube terminates 2.5 cm above the carina.  Stable right IJ venous catheter and enteric tube.  Mild interstitial edema with probable bilateral pleural effusions, improved.  Stable moderate cardiomegaly.  IMPRESSION: Endotracheal tube terminates 2.5 cm above the carina.  Stable moderate cardiomegaly.  Mild interstitial edema  with probable bilateral pleural effusions, improved.  Original Report Authenticated By: Charline Bills, M.D.   Dg Chest Port 1 View  03/27/2011  *RADIOLOGY REPORT*  Clinical Data: Right IJ central line, check endotracheal tube position  PORTABLE CHEST - 1 VIEW  Comparison: 03/26/2011  Findings: Cardiomegaly again noted.  Endotracheal tube unchanged in position with tip 1.7 cm above the carina.  NG tube in place. Stable right IJ central line position with tip in distal SVC. Diffuse bilateral airspace disease again noted with slight improvement in aeration of the upper lobes.  Probable bilateral small pleural effusion.  IMPRESSION: Endotracheal tube unchanged in position with tip 1.7 cm above the carina.  NG tube in place.  Stable right IJ central line position with tip in distal SVC.  Diffuse bilateral airspace disease again noted with slight improvement in aeration of the upper lobes. Probable bilateral small pleural effusion.  Original Report Authenticated By: Natasha Mead, M.D.   Dg Chest Port 1 View  03/26/2011  *RADIOLOGY REPORT*  Clinical Data: Intubated.  PORTABLE CHEST - 1 VIEW  Comparison: None.  Findings: Endotracheal tube is 1.6 cm above the carina.  There is cardiomegaly.  Diffuse bilateral airspace disease.  Probable small right effusion.  No acute bony abnormality.  NG tube enters the stomach.  IMPRESSION: Endotracheal tube 1.6 cm above the carina.  Cardiomegaly with diffuse bilateral airspace disease, edema versus infection.  Small right effusion.  Original Report Authenticated By: Cyndie Chime, M.D.   Dg Chest Port 1v Same Day  03/26/2011  *RADIOLOGY REPORT*  Clinical Data: Central line placement.  PORTABLE CHEST - 1 VIEW SAME DAY  Comparison: 03/26/2011  Findings: Right central line has been placed with the tip in the SVC.  No pneumothorax.  Endotracheal tube remains less than 2 cm above the carina.  Diffuse bilateral airspace disease again noted with cardiomegaly and small right effusion.  No  real change in the appearance of the lungs.  IMPRESSION: Right central line tip in the SVC.  No pneumothorax.  Otherwise no change.  Original Report Authenticated By: Cyndie Chime, M.D.   Dg Abd Portable 1v  04/08/2011  *RADIOLOGY REPORT*  Clinical Data: Abdominal pain  PORTABLE ABDOMEN - 1 VIEW  Comparison: 04/05/2011  Findings: Gas in non dilated stomach.  Gas in nondilated transverse colon.  Gas is present in the rectum.  Negative for bowel obstruction.  No acute bony abnormality.  IMPRESSION: Nonobstructive bowel gas pattern.  Original Report Authenticated By: Camelia Phenes, M.D.     ECHO:Left ventricle: The cavity size was normal. Wall thickness was increased in a pattern of moderate LVH. Systolic function was moderately reduced. The estimated ejection fraction was in the range of 35% to 40%. Wall motion was normal; there  were no regional wall motion abnormalities. - Ventricular septum: A septal defect cannot be excluded. - Aortic valve: Trivial regurgitation. - Atrial septum: No defect or patent foramen ovale was identified. - Pericardium, extracardiac: A small, free-flowing pericardial effusion was identified circumferential to the heart. There was no evidence of hemodynamic compromise. Features were not consistent with tamponade physiology. There was a right pleural effusion. There was a left pleural effusion.         Recent Results (from the past 240 hour(s))  URINE CULTURE     Status: Normal   Collection Time   04/06/11  5:30 AM      Component Value Range Status Comment   Specimen Description URINE, CATHETERIZED   Final    Special Requests NONE   Final    Setup Time [id number]   Final    Colony Count 80,000 COLONIES/ML   Final    Culture YEAST   Final    Report Status 04/07/2011 FINAL   Final   CLOSTRIDIUM DIFFICILE BY PCR     Status: Normal   Collection Time   04/06/11  5:40 AM      Component Value Range Status Comment   C difficile by pcr NEGATIVE  NEGATIVE   Final   CULTURE, BLOOD (ROUTINE X 2)     Status: Normal   Collection Time   04/06/11  9:47 AM      Component Value Range Status Comment   Specimen Description BLOOD RIGHT ANTECUBITAL   Final    Special Requests BOTTLES DRAWN AEROBIC AND ANAEROBIC 10CC   Final    Setup Time [id number]   Final    Culture NO GROWTH 5 DAYS   Final    Report Status 04/12/2011 FINAL   Final   CULTURE, BLOOD (ROUTINE X 2)     Status: Normal   Collection Time   04/06/11 10:00 AM      Component Value Range Status Comment   Specimen Description BLOOD RIGHT HAND   Final    Special Requests BOTTLES DRAWN AEROBIC AND ANAEROBIC 10CC   Final    Setup Time [id number]   Final    Culture NO GROWTH 5 DAYS   Final    Report Status 04/12/2011 FINAL   Final     BRIEF ADMITTING H & P: History of Present Illness: 21 y/o female with no past medical history presented to the [facility] ED after respiratory arrest this evening. Her mother states that she has had URI symptoms for the past month including cough and shortness of breath. She was treated with one week of avelox for "walking pneumonia" two weeks ago. Her symptoms did not improve so she was seen again in urgent care and given an albuterol inhaler and an antihypertensive because she had profoundly elevated blood pressure. She continued to have orthopnea, pnd, and shortness of breath in the week prior to this admission. On the night of admission she had the sudden onset of shortness of breath while out with a friend getting food. He says that he called 911 and she was still talking when EMS arrived, but suddenly developed respiratory arrest after their arrival. EMS performed CPR for PEA arrest for less than five minutes and she regained a pulse with no meds. Upon arrival to the [facility] ED she was intubated and started on the arctic sun protocol. She was hypertensive after arrival. PCCM consulted for admission.  Hospital Course:    21 year old female with cardiac arrest due to respiratory  arrest, who has significant cardiomyopathy and HTN, with acute renal failure, now extubated 1/10, and recovering renal function (unknown baseline creatinine). Illness preceded by edema and leg/abd swelling for a month or 2 PTA)   1. VDRF - Patient was admitted after cardiac arrest due to  respiratory arrest. She was  admitted January 4 by CCM. She was extubated January 10. She received treatment also for pneumonia, pulmonary edema. Patient underwent hypothermia protocol. Suffered seizures upon rewarming. Patient respiratory failure has resolved.   2. Cardiac - Cardiomyopathy with reduced systolic function, EF 40-45% (Echo 03/27/2011). Suspect HTN-cardiomyopathy. Was flu negative, ? 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  - Renal artery stenosis U/S negative on 1/14  -Patient will need to follow up with Dr Sharyn Lull in 1 week.   3. Renal: AKI, Could be due to malig HTN, or glomerular disease. Patient had kidney biopsy January 18. Pathology report would need to be followup by nephrologist. Patient was initially treated with Lasix. Lasix was recently stopped by nephrologist. Monitor volume status now that patient is off of Lasix. Shee would need Bmet to follow renal function.  HIV Neg  ANA Neg, DSDNA ab 22 (WNL) ASO 87 (WNL)  Diract Antiglobulin complement and IgG Neg  Total complement high, C3, C4 Normal  PTH 250.2 (H)  Kappa Light Chains 8.6 (H) Lamda Light Chains 5.8(H), Ratio 1.46 (WNL)  ANCA Negative   4. ID - Received treatment for PNA. White blood cell has been normal.  Antibiotics:  03/26/11 vanc >>1/5  03/26/11 ceftriaxone >> 04/02/11  03/26/11 moxi >>1/5  03/26/11 tamiflu >>03/27/11   5. Neuro - seizures, myoclonus. Also with severe B LE weakness.  Patient was evaluated by neurologist, recommend Depacon and clonazepam for myoclonus. Patient was diagnosed with hypoxic encephalopathy. Continue with Depacon 1000 mg q12 hr. Myoclonus improved on Clonazepam .   6. Heme  Anemia (03/26/2011).  Probably anemia of acute illness and chronic disease. Monitor hemoglobin. Hemoglobin has remained stable after perinephric hematoma. 7. Diet:  Regular.  8-Hypertension:  Continue with hydralazine, clonidine, metoprolol, Norvasc. Consider increase Norvasc.  10-Abdominal pain, diarrhea: c diff negative. KUB negative. Abdominal pain resolved. Diarrhea has resolved. 11-Perinephric Hematoma: Patient developed perinephric hematoma after renal biopsy . Hb has remained stable.  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 I spoke with Dr. Sharyn Lull,  and recommend check mag level, and continue with  Metoprolol. Would need to follow with Dr. Sharyn Lull in 1 week.      Disposition and Follow-up:   Follow-up Information    Follow up with Robynn Pane, MD in 1 week.   Contact information:   [address] [place] [address] [phone]           DISCHARGE EXAM:   General: Alert, awake, oriented x3, in no acute distress.  HEENT: No bruits, no goiter.  Heart: Regular rate and rhythm, without murmurs, rubs, gallops.  Lungs: CTA, bilateral air movement.  Abdomen: Soft, nontender, nondistended, positive bowel sounds.  Neuro: Grossly intact, nonfocal.  Extremities; trace edema.    Blood pressure 157/88, pulse 77, temperature 97.4 F (36.3 C), temperature source Oral, resp. rate 18, height 5\' 2"  (1.575 m), weight 69.536 kg (153 lb 4.8 oz), last menstrual period 03/02/2011, SpO2 98.00%.   Basename 04/13/11 0500 04/12/11 0500  NA 141 141  K 3.8 3.8  CL 106 106  CO2 25 25  GLUCOSE 92 83  BUN 72* 73*  CREATININE 1.95*  1.99*  CALCIUM 7.9* 7.8*  MG -- --  PHOS 5.1* 4.4    Basename 04/13/11 0500 04/12/11 0500  AST -- --  ALT -- --  ALKPHOS -- --  BILITOT -- --  PROT -- --  ALBUMIN 1.5* 1.6*   Basename 04/13/11 0500 04/12/11 0500  WBC 8.5 10.6*  NEUTROABS -- --  HGB 8.5* 8.3*  HCT 25.5* 25.4*  MCV 89.2 88.5  PLT  319 311    Signed: Kelcey Korus M.D. 04/13/2011, 12:57 PM

## 2011-04-13 NOTE — Progress Notes (Signed)
Rehab admissions - Noted patient had run of vtach this am.  Waiting for cardiology to clear patient.  Anticipate admit to inpatient rehab today if MD clears patient.  Call me for questions.  Pager [phone]

## 2011-04-13 NOTE — Progress Notes (Signed)
D/c orders received; report called to [place], Theola Sequin on 4000; pt transported to 4025, remained in stable condition

## 2011-04-13 NOTE — Progress Notes (Signed)
Pt is alert and oriented,  Presents with word finding difficultly and child like behavior, follows simple commands. Oriented pt and mother to room,  Call light,  Safety plan, safety video.  Mother verbalized a understanding.  Pt denies pain,   Mother will stay with pt at night. Nemiah Commander

## 2011-04-14 ENCOUNTER — Other Ambulatory Visit: Payer: Self-pay | Admitting: Family Medicine

## 2011-04-14 DIAGNOSIS — Z5189 Encounter for other specified aftercare: Secondary | ICD-10-CM

## 2011-04-14 DIAGNOSIS — I509 Heart failure, unspecified: Secondary | ICD-10-CM

## 2011-04-14 DIAGNOSIS — G931 Anoxic brain damage, not elsewhere classified: Secondary | ICD-10-CM

## 2011-04-14 DIAGNOSIS — R569 Unspecified convulsions: Secondary | ICD-10-CM

## 2011-04-14 DIAGNOSIS — R5381 Other malaise: Secondary | ICD-10-CM

## 2011-04-14 LAB — RENAL FUNCTION PANEL
CO2: 25 mEq/L (ref 19–32)
GFR calc Af Amer: 45 mL/min — ABNORMAL LOW (ref >90)
GFR calc non Af Amer: 39 mL/min — ABNORMAL LOW (ref >90)
Glucose, Bld: 95 mg/dL (ref 70–99)
Phosphorus: 4.6 mg/dL (ref 2.3–4.6)
Potassium: 4 mEq/L (ref 3.5–5.1)
Sodium: 141 mEq/L (ref 135–145)

## 2011-04-14 MED ORDER — HYDRALAZINE HCL 50 MG PO TABS
50.0000 mg | ORAL_TABLET | Freq: Four times a day (QID) | ORAL | Status: DC
  Administered 2011-04-14 – 2011-04-28 (×55): 50 mg via ORAL
  Filled 2011-04-14 (×60): qty 1

## 2011-04-14 MED ORDER — AMLODIPINE BESYLATE 10 MG PO TABS
10.0000 mg | ORAL_TABLET | Freq: Every day | ORAL | Status: DC
  Administered 2011-04-15 – 2011-04-28 (×14): 10 mg via ORAL
  Filled 2011-04-14 (×17): qty 1

## 2011-04-14 MED ORDER — MEGESTROL ACETATE 400 MG/10ML PO SUSP
400.0000 mg | Freq: Every day | ORAL | Status: DC
  Administered 2011-04-14 – 2011-04-22 (×9): 400 mg via ORAL
  Filled 2011-04-14 (×11): qty 10

## 2011-04-14 MED ORDER — FUROSEMIDE 40 MG PO TABS
40.0000 mg | ORAL_TABLET | Freq: Two times a day (BID) | ORAL | Status: DC
  Administered 2011-04-15 – 2011-04-17 (×6): 40 mg via ORAL
  Filled 2011-04-14 (×11): qty 1

## 2011-04-14 MED ORDER — AMLODIPINE BESYLATE 5 MG PO TABS
5.0000 mg | ORAL_TABLET | Freq: Every day | ORAL | Status: DC
  Filled 2011-04-14: qty 1

## 2011-04-14 NOTE — Patient Care Conference (Signed)
Inpatient RehabilitationTeam Conference Note Date: 04/14/2011   Time: 5:08 PM    Patient Name: Heather Sims      Medical Record Number: [id number]  Date of Birth: 1990-10-23 Sex: Female         Room/Bed: 4025/4025-01 Payor Info: Payor:     Admitting Diagnosis: Anoxic BI, deconditioned  Admit Date/Time:  04/13/2011  5:00 PM Admission Comments: No comment available   Primary Diagnosis:  Anoxic brain injury Principal Problem: Anoxic brain injury  Patient Active Problem List  Diagnoses Date Noted  . Anoxic brain injury 04/14/2011  . Hypertension 04/07/2011  . ? Viral Cardiomyopathy 04/07/2011  . AKI (acute kidney injury) 03/26/2011  . Acute heart failure 03/26/2011  . Hypoxemia 03/26/2011  . Pneumonia 03/26/2011  . Anemia 03/26/2011    Expected Discharge Date: Expected Discharge Date: 05/01/11  Team Members Present: Physician: Dr. 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, with cm (unclear origin)  increased activity tolerance, bp control, increased po intake  see above   Bowel/Bladder   Incontinent urine at times; > at HS; LBM 1/22  continent bowel and bladder  timed toileting   Swallow/Nutrition/ Hydration             ADL's   supervision/ set up grooming/ UB dressing, mod assist transfers and bathing, total assist lower body dressing and toileting  modified independence with grooming and UB dressing, supervision with LB dressing, bathing, toileting, and ADL transfers  ADL transfers, bimanual coordination, balance activities, cognitive activities, pt/ familiy education   Mobility   mod A  bed mobility; min to max a for transfers due to myoclonic activity; pre-gait stood x 10 seconds; w/c mobility x 80' with min A  S for basic and car transfers,  S for w/c mobility x 150' ; S for ambulation x 150'  bed mobility, transfers, postural control, pre-gait activities   Communication             Safety/Cognition/ Behavioral Observations  Mod-Max A  Supervision  attention, problem solving    Pain   Denies  managed  monitor, observe for non-verbal s/s of pain   Skin   biopsy sites dry  no s/s infection  monitor      *See Interdisciplinary Assessment and Plan and progress notes for long and short-term goals  Barriers to Discharge: impaired cognition and processing    Possible Resolutions to Barriers:  cognitive perceptual training, strength and mobility training    Discharge Planning/Teaching Needs:  home with parents able to provide 24/7 assistance -       Team Discussion: Slow to process, respond.  Goals are supervision.  Will need cardio f/up after d/c.   Revisions to Treatment Plan: none    Continued Need for Acute Rehabilitation Level of Care: The patient requires daily medical management by a physician with specialized training in physical medicine and rehabilitation for the following conditions: Daily direction of a multidisciplinary physical rehabilitation program to ensure safe treatment while eliciting the highest outcome that is of practical value to the patient.: Yes Daily medical management of patient stability for increased activity during participation in an intensive rehabilitation regime.: Yes Daily analysis of laboratory  values and/or radiology reports with any subsequent need for medication adjustment of medical intervention for : Neurological problems;Cardiac problems;Pulmonary problems;Other  Brock Ra 04/14/2011, 5:08 PM

## 2011-04-14 NOTE — Progress Notes (Signed)
Patient information reviewed and entered into [facility] system by Keval Nam, RN, CRRN, PPS Coordinator.  Information including medical coding and functional independence measure will be reviewed and updated through discharge.    

## 2011-04-14 NOTE — Progress Notes (Signed)
Patient ID: Heather Sims, female   DOB: 02-Nov-1990, 21 y.o.   MRN: [id number] Subjective/Complaints: Review of Systems  Constitutional: Positive for malaise/fatigue.  Neurological: Positive for tremors.  All other systems reviewed and are negative.  stil not much appetite 1/22  Objective: Vital Signs: Blood pressure 175/91, pulse 80, temperature 98.7 F (37.1 C), temperature source Oral, resp. rate 20, last menstrual period 03/02/2011, SpO2 98.00%. No results found.  Basename 04/13/11 2025 04/13/11 0500  WBC 8.4 8.5  HGB 9.9* 8.5*  HCT 29.2* 25.5*  PLT 352 319    Basename 04/13/11 2025 04/13/11 0500  NA 141 141  K 4.0 3.8  CL 104 106  CO2 26 25  GLUCOSE 108* 92  BUN 65* 72*  CREATININE 1.94* 1.95*  CALCIUM 8.0* 7.9*   CBG (last 3)  No results found for this basename: GLUCAP:3 in the last 72 hours  Wt Readings from Last 3 Encounters:  04/10/11 69.536 kg (153 lb 4.8 oz)    Physical Exam:  General appearance: no distress and slowed mentation Head: Normocephalic, without obvious abnormality, atraumatic Eyes: conjunctivae/corneas clear. PERRL, EOM's intact. Fundi benign. Ears: normal TM's and external ear canals both ears Nose: Nares normal. Septum midline. Mucosa normal. No drainage or sinus tenderness. Throat: lips, mucosa, and tongue normal; teeth and gums normal Neck: no adenopathy, no carotid bruit, no JVD, supple, symmetrical, trachea midline and thyroid not enlarged, symmetric, no tenderness/mass/nodules Back: symmetric, no curvature. ROM normal. No CVA tenderness. Resp: clear to auscultation bilaterally Cardio: regular rate and rhythm, S1, S2 normal, no murmur, click, rub or gallop and normal apical impulse GI: soft, non-tender; bowel sounds normal; no masses,  no organomegaly Extremities: trace le edema.  pulses 2+ Pulses: 2+ and symmetric Skin: Skin color, texture, turgor normal. No rashes or lesions Neurologic: delayed processing and word finding deficits.   Oriented to name and hospital. Speech slurred and low volume.  UE's grossly 2+ to 3+/5.  LE's 2/5 prox to 3/5 distally.  Withdraws to pain. DTR's 2-3+.  Incision/Wound: n/a  exam updated 1/22  Assessment/Plan: 1. Functional deficits secondary to anoxic brain injury which require 3+ hours per day of interdisciplinary therapy in a comprehensive inpatient rehab setting. Physiatrist is providing close team supervision and 24 hour management of active medical problems listed below. Physiatrist and rehab team continue to assess barriers to discharge/monitor patient progress toward functional and medical goals. 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  2. DVT Prophylaxis/Anticoagulation: SCDs.  3. Seizure disorder. Valproate 1000 mg every 12 hours. Latest valproic acid level January 15 of 42.6. Monitor closely for any increased seizure activity. 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  Check VPA level this week. 4. Hypertension. Norvasc 2.5 mg daily, clonidine patch 0.3 mg change weekly, hydralazine 25 mg 4 times daily, Lopressor 100 mg twice daily. Monitor closely with increased activity. May need to increase norvasc.  5 Renal failure. 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 Await renal biopsy completed January 18.. Strict I and O.'s for now. 6. Nutrition: Continue to encourage intake. Consider appetite stimulant. Valproic acid should help with appetite as well.  -add megace today   LOS (Days) 1 A FACE TO FACE EVALUATION WAS PERFORMED  Sandra Tellefsen T 04/14/2011, 7:14 AM

## 2011-04-14 NOTE — Evaluation (Signed)
Physical Therapy Assessment and Plan  and Session Note  Patient Details  Name: Heather Sims MRN: [id number] Date of Birth: April 11, 1990  PT Diagnosis: Abnormal posture, Ataxia, Cognitive deficits, Contracture of joint: L ankle, Coordination disorder, Difficulty walking, Edema, Hypertonia, Muscle spasms, Muscle weakness and Pain in bil LEs Rehab Potential: Good ELOS: 2-3 weeks   Today's Date: 04/14/2011 Time: 4098-1191 and 4782-9562 Time Calculation (min): 63 min and 30 min  Assessment & Plan Clinical Impression: Patient is a 21 year old right-handed female with recent diagnosis of hypertension as well as upper respiratory infection which she had been placed on Avelox. Admitted January 3 with sudden onset shortness of breath and cardiorespiratory failure with CPR in the field. She was pulseless for approximately 5 minutes and intubated in the emergency department. Underwent hypothermia protocol and suffered seizures upon rewarming. Cranial CT scan and MRI of the brain showed no acute changes. Echocardiogram with ejection fraction 40% and systolic function moderately reduced. TEE with diffuse hypokinesis ejection fraction 45%. Venous Doppler studies lower tremor his were negative. EEG was minimal seizure activity. MRI of cervical thoracic lumbar spine unremarkable. Noted hemoglobin 8.1 and creatinine 2.31 upon admission she was transfused 3 units of packed red blood cells. Renal ultrasound was negative for hydronephrosis. Nephrology consulted felt renal insufficiency due to hypertension. Renal biopsy completed January 18 to complete workup of acute renal insufficiency with results pending. Post operative biopsy with perirenal hematoma and close monitoring with CBC that remained stable. Latest creatinine 1.95 felt to be stabilized. High dose Lasix had been discontinued with stabilizing renal function January 20. Patient had been on subcutaneous heparin for deep vein thrombosis prophylaxis was discontinued  January 17 with renal biopsy completed January 18. Placed on valproate 1000 mg every 12 hours per neurology services for seizure disorder. No further seizure activity has been reported. Patient continued to have bouts of myoclonus of the lower extremities improved after being placed on clonazepam. 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    Patient transferred to CIR on 04/13/2011 .  PMH unknown at eval.  Patient currently requires max with mobility secondary to muscle weakness, muscle joint tightness and impaired motor control,severely decreased activity tolerance and impaired muscle grading.  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   She lived in an apartment PTA, but will D/C to her mother's one level home with 1 step to enter.         Patient will benefit from skilled PT intervention to maximize safe functional mobility, minimize fall risk and decrease caregiver burden for planned discharge home with 24 hour supervision.  Anticipate patient will benefit from follow up HH at discharge.  PT - End of Session Activity Tolerance: Tolerates < 10 min activity with changes in vital signs Endurance Deficit: Yes Endurance Deficit Description: needs frequent rest breaks PT Assessment Rehab Potential: Good PT Plan PT Frequency: 1-2 X/day, 60-90 minutes Estimated Length of Stay: 2-3 weeks PT Treatment/Interventions: Ambulation/gait training;Balance/vestibular training;Cognitive remediation/compensation;DME/adaptive equipment instruction;Community reintegration;Functional mobility training;Neuromuscular re-education;Pain management;Patient/family education;Therapeutic Exercise;Therapeutic Activities;Stair training;Splinting/orthotics;UE/LE Strength taining/ROM;UE/LE Coordination activities;Wheelchair propulsion/positioning PT Recommendation Follow Up Recommendations: Home health  PT Equipment Recommended: Wheelchair (measurements);Wheelchair cushion (measurements);Rolling walker with 5" wheels (wheelchair back for postural control)  Precautions/Restrictions Precautions Precautions: Fall Restrictions Weight Bearing Restrictions: No General: myoclonic movements bil LEs increased in PM vs AM, requiring increased assistance for transfers  Vital Signs Therapy Vitals Pulse Rate: 78  BP: 220/104 mmHg (  in R calf due to difficulty getting reading in RUE); RN aware Patient Position, if appropriate: Sitting Oxygen Therapy SpO2: 97 % O2 Device: None (Room air) Pain Pain Assessment Pain Score:   4 Pain Location: R  Leg Pain Descriptors: Aching Pain Onset: With Activity Pain Intervention(s): Medication (See eMAR) Multiple Pain Sites: Yes 2nd Pain Site Pain Onset: With Activity (pain in L calf with muscle stretch) Home Living/Prior Functioning   Vision/Perception     Cognition Arousal/Alertness: Lethargic Orientation Level: Disoriented to time Attention: Sustained Focused Attention: Appears intact Sustained Attention: Impaired Awareness Impairment: Emergent impairment Sensation Sensation Light Touch: Appears Intact Proprioception: Appears Intact Coordination Gross Motor Movements are Fluid and Coordinated: No Fine Motor Movements are Fluid and Coordinated: No Coordination and Movement Description: myoclonic activity bil LEs with movement Heel Shin Test: R over L with poor coordination; unable to perform L over R due to L calf pain Motor  Motor Motor: Abnormal postural alignment and control;Ataxia (myoclonic movement bil LEs with movement)  Mobility   Locomotion     Trunk/Postural Assessment  Cervical Assessment Cervical Assessment: Within Functional Limits Thoracic Assessment Thoracic Assessment: Exceptions to [facility] (kyphotic posture) Lumbar Assessment Lumbar Assessment: Exceptions to [facility] (sits in posterior tilt, with increased R wt  bearing) Postural Control Postural Control: Deficits on evaluation (sits with R trunk shortened, limited pelvic rotation) Trunk Control: min a to maintain balance sitting EOM; trunk ataxia evident upon stand> sit ; increased posterior pelvic tilt and RLE sliding forward during transfer Balance Balance Balance Assessed: Yes Static Sitting Balance Static Sitting - Balance Support: Feet supported;Bilateral upper extremity supported Static Sitting - Level of Assistance: 4: Min assist Extremity Assessment      RLE Assessment RLE Assessment: Exceptions to [facility] (grossly 3+/4- except for ankle DF 2-/5) LLE Assessment LLE Assessment: Exceptions to [facility] (grossly 3-/5 except for ankle DF 1/5) L hamstrings and heel cords tight; L calf tender to palpation.  Recommendations for other services: None  Discharge Criteria: Patient will be discharged from PT if patient refuses treatment 3 consecutive times without medical reason, if treatment goals not met, if there is a change in medical status, if patient makes no progress towards goals or if patient is discharged from hospital.  The above assessment, treatment plan, treatment alternatives and goals were discussed and mutually agreed upon: by patient  Am Treatment: w/c mobility using both hands with VCs and manual cues to avoid getting hands caught in rims, x 80' with min a for steering.  Sitting dynamic balance activity reaching within base of support with either hand, close S and VCs for balance and posture. Neuromuscular re-ed via manual cues, VCs, demo for midline posture, trunk rotation, flexion, extension.   Pt demonstrates trunk ataxia, and sits in posterior pelvic tilt at rest, but is able to sit with = wt bearing bil LEs, neutral pelvis.  Sit> Stand with max A, increased wt bearing RLE; unable to transfer wt to LLE, x 10 seconds.  Pt exhausted, tranferred w/c> mat with SB with min A, to bed with min A.  PM treatment:Pt rated pain bil feet 2/10;  premedicated.   Pt's myoclonic muscle activity significantly increased this PM.  Bed> w/c transfers squat pivot with max A, RLE and trunk extending unsafely during transfer.  W/c> toilet transfer with +2 assist, pt performing 25%.  Additional person performed clothing management.  Sitting dynamic balance reaching out of BOS with either hand, facilitating trunk responses for balance, with close supervision.  Neuromuscular re-ed via  manual cues, visual cues.  Pt followed 2 step commands.  Kensie Susman 04/14/2011, 3:47 PM

## 2011-04-14 NOTE — Progress Notes (Signed)
Speech Language Pathology Assessment and Plan  Patient Details  Name: Heather Sims MRN: [id number] Date of Birth: [birth year]  SLP Diagnosis: Moderate cognitive impairment  Rehab Potential: Good ELOS: 3-4 weeks    Time: 4098-1191 Time Calculation (min): 55 min  Session: Administered cognitive-linguistic evaluation, please see below for details.   Assessment & Plan Clinical Impression: 21 year old right-handed female with recent diagnosis of hypertension as well as upper respiratory infection which she had been placed on Avelox. Admitted January 3 with sudden onset shortness of breath and cardiorespiratory failure with CPR in the field. She was pulseless for approximately 5 minutes and intubated in the emergency department. Underwent hypothermia protocol and suffered seizures upon rewarming. Cranial CT scan and MRI of the brain showed no acute changes. Noted hemoglobin 8.1 and creatinine 2.31 upon admission she was transfused 3 units of packed red blood cells.  No further seizure activity has been reported. Patient continued to have bouts of myoclonus of the lower extremities improved after being placed on clonazepam. Ongoing bouts of confusion that have improved felt to be hypoxic encephalopathy.Patient with profound deconditioning thus inpatient rehabilitation services was consulted. Pt presents to CIR with moderate cognitive impairments characterized by impaired sustained attention, working memory, functional problem solving and emergent awareness. Pt also demonstrates slow processing with intermittent word-finding difficulty. Pt would benefit from skilled SLP services to maximize cognitive function and overall independence.   Short Term Goals: 1. Pt will sustain attention to a functional task for 10 minutes with Mod verbal cues for redirection. 2. Pt will utilize external memory aids (schedule, calendar, etc.) to demonstrate recall of daily events with Min A verbal and question cues. 3. Pt  will demonstrate functional problem solving with basic and familiar tasks with Mod verbal and visual cues. 4. Pt will demonstrate emergent awareness of cognitive deficits during functional and familiar tasks with Mod A verbal and question cues.     SLP - End of Session Patient left: in chair (at nurses station) Assessment Rehab Potential: Good Therapy Diagnosis: Cognitive Impairments SLP Plan SLP Frequency: 1-2 X/day, 30-60 minutes Estimated Length of Stay: 3-4 weeks  SLP Treatment/Interventions: Cognitive remediation/compensation;Therapeutic Activities;Patient/family education;Speech/Language facilitation Recommendation Follow up Recommendations: Home Health SLP;Outpatient SLP;24 hour supervision/assistance Equipment Recommended: None recommended by SLP  Precautions/Restrictions  Precautions Precautions: Fall Vital Signs Therapy Vitals BP: 193/119 mmHg Patient Position, if appropriate: Sitting Pain Pain Assessment Pain Assessment: 0-10 Pain Score:   5 Pain Type: Acute pain (only has pain when getting up) Pain Location: Leg Pain Orientation: Right;Left Pain Descriptors: Aching;Heaviness Pain Onset: With Activity Patients Stated Pain Goal: 0 Pain Intervention(s): RN made aware Prior Functioning Pt lived in an apartment with friends and was a full time Consulting civil engineer at [employer].  Cognition Overall Cognitive Status: Impaired Arousal/Alertness: Lethargic Orientation Level: Disoriented to time Attention: Sustained Focused Attention: Appears intact Sustained Attention: Impaired Sustained Attention Impairment: Functional basic;Verbal basic Memory: Impaired Memory Impairment: Decreased short term memory;Decreased recall of new information Decreased Short Term Memory: Verbal basic;Functional basic Awareness: Impaired Awareness Impairment: Emergent impairment Problem Solving: Impaired Problem Solving Impairment: Functional basic Executive Function: Self Monitoring;Self  Correcting;Organizing Reasoning: Impaired Reasoning Impairment: Verbal basic;Functional basic Organizing: Impaired Organizing Impairment: Verbal basic;Functional basic Self Monitoring: Impaired Self Monitoring Impairment: Verbal basic Self Correcting: Impaired Self Correcting Impairment: Verbal basic Safety/Judgment: Impaired Comprehension Auditory Comprehension Overall Auditory Comprehension: Impaired Yes/No Questions: Within Functional Limits Commands: Impaired Multistep Basic Commands: Other (comment) (due to decreased attention ) Conversation: Simple Interfering Components: Attention;Processing speed EffectiveTechniques: Extra processing time;Repetition Visual  Recognition/Discrimination Discrimination: Within Function Limits Reading Comprehension Reading Status: Within funtional limits Expression Expression Primary Mode of Expression: Verbal Verbal Expression Overall Verbal Expression: Appears within functional limits for tasks assessed, pt reports intermittent word-finding difficulties, but none present throughout session.  Level of Generative/Spontaneous Verbalization: Gaffer Expression Dominant Hand: Right Written Expression: Not tested Oral/Motor Oral Motor/Sensory Function Overall Oral Motor/Sensory Function: Appears within functional limits for tasks assessed Motor Speech Overall Motor Speech: Appears within functional limits for tasks assessed   Recommendations for other services: None  Discharge Criteria: Patient will be discharged from SLP if patient refuses treatment 3 consecutive times without medical reason, if treatment goals not met, if there is a change in medical status, if patient makes no progress towards goals or if patient is discharged from hospital.  The above assessment, treatment plan, treatment alternatives and goals were discussed and mutually agreed upon: by patient  Veronica Guerrant 04/14/2011 10:44 AM

## 2011-04-14 NOTE — Plan of Care (Signed)
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  Primary Diagnosis:    Anoxic brain injury Co-morbidities: cardiomyopathy, seizures, myoclonus  Functional Problem List  Patient demonstrates impairments in the following areas: Balance, Cognition, Endurance, Motor, Pain and Safety  Basic ADL's: grooming, bathing, dressing and toileting Advanced ADL's: not applicable at this time  Transfers:  bed mobility, bed to chair, toilet, tub/shower and car Locomotion:  ambulation, wheelchair mobility and stairs  Additional Impairments:  Communication  comprehension and expression and Social Cognition   social interaction, problem solving, memory, attention and awareness  Anticipated Outcomes Item Anticipated Outcome  Eating/Swallowing  Supervision to mod I  Basic self-care  supervision  Tolieting  supervision  Bowel/Bladder  Supervision to mod I  Transfers  Supervision overall  Locomotion  Supervision ambulation and w/c propulsion x 150'  Communication   supervision-Mod I  Cognition  supervision  Pain    Safety/Judgment  supervision  Other     Therapy Plan:   OT Frequency: 1-2 X/day, 60-90 minutes SLP Frequency: 1-2 X/day, 30-60 minutes  PT Frequency 1-2/x/day, 60-90 minutes Team Interventions: Item RN PT OT SLP SW TR Other  Self Care/Advanced ADL Retraining   x      Neuromuscular Re-Education  x x      Therapeutic Activities  x x x     UE/LE Strength Training/ROM  x x      UE/LE Coordination Activities  x x      Visual/Perceptual Remediation/Compensation         DME/Adaptive Equipment Instruction  x x      Therapeutic Exercise  x x      Balance/Vestibular Training  x x      Patient/Family Education  x x x     Cognitive Remediation/Compensation  x x x     Functional Mobility Training  x x      Ambulation/Gait Training  xx       Engineer, structural  Propulsion/Positioning  x       Surveyor, mining Facilitation    x     Bladder Management x        Bowel Management x        Disease Management/Prevention x        Pain Management  x       Medication Management x        Skin Care/Wound Management x        Splinting/Orthotics         Discharge Planning  x   x    Psychosocial Support     x                       Team Discharge Planning: Destination:  Home Projected Follow-up:  PT, OT and SLP Projected Equipment Needs:  Tub Bench, Dan Humphreys and Wheelchair Patient/family involved in discharge planning:  Yes  MD ELOS: 3 weeks  Medical Rehab Prognosis:  Good Assessment: Patient admitted for CIR therapies.  Pt will receive 3+ hours of therapy at least 5 days per week with focus on strength, NMR, CPT, safety, ADL's and adaptive equipment training.  Family will also be involved with education.  Close observation is being paid to cardiac tolerance of increased physical activity.  The source of her CM is unclear at this point.

## 2011-04-14 NOTE — Progress Notes (Signed)
Subjective: No complaints, absolute UOP not recorded but creatinine is down.  Holidays has delayed the processing of the renal biopsy.  BP has been very high !  Objective Vital signs in last 24 hours: Filed Vitals:   04/14/11 0512 04/14/11 0904 04/14/11 1110 04/14/11 1127  BP: 175/91 193/119 222/104 194/108  Pulse: 80  80 84  Temp: 98.7 F (37.1 C)     TempSrc: Oral     Resp: 20     SpO2: 98%      Weight change:   Intake/Output Summary (Last 24 hours) at 04/14/11 1308 Last data filed at 04/14/11 0415  Gross per 24 hour  Intake      0 ml  Output      3 ml  Net     -3 ml   Labs: Basic Metabolic Panel:  Lab 04/14/11 1478 04/13/11 2025 04/13/11 0500 04/12/11 0500 04/11/11 0500 04/10/11 0610 04/09/11 0837  NA 141 141 141 141 141 138 137  K 4.0 4.0 3.8 3.8 3.8 4.0 3.2*  CL 105 104 106 106 107 106 105  CO2 25 26 25 25 24 21 21   GLUCOSE 95 108* 92 83 109* 93 91  BUN 65* 65* 72* 73* 77* 84* 81*  CREATININE 1.82* 1.94* 1.95* 1.99* 2.01* 2.31* 2.18*  ALB -- -- -- -- -- -- --  CALCIUM 8.1* 8.0* 7.9* 7.8* 8.0* 7.9* 7.9*  PHOS 4.6 -- 5.1* 4.4 4.8* -- --   Liver Function Tests:  Lab 04/14/11 0650 04/13/11 2025 04/13/11 0500  AST -- 26 --  ALT -- 12 --  ALKPHOS -- 50 --  BILITOT -- 0.1* --  PROT -- 5.2* --  ALBUMIN 1.7* 1.7* 1.5*   No results found for this basename: LIPASE:3,AMYLASE:3 in the last 168 hours No results found for this basename: AMMONIA:3 in the last 168 hours CBC:  Lab 04/13/11 2025 04/13/11 0500 04/12/11 0500 04/11/11 0500  WBC 8.4 8.5 10.6* 10.7*  NEUTROABS 5.7 -- -- --  HGB 9.9* 8.5* 8.3* 9.1*  HCT 29.2* 25.5* 25.4* 27.3*  MCV 89.0 89.2 88.5 88.1  PLT 352 319 311 303   PT/INR: @labrcntip (inr:5) Cardiac Enzymes: No results found for this basename: CKTOTAL:5,CKMB:5,CKMBINDEX:5,TROPONINI:5 in the last 168 hours CBG: No results found for this basename: GLUCAP:5 in the last 168 hours  Iron Studies: No results found for this basename:  IRON:30,TIBC:30,TRANSFERRIN:30,FERRITIN:30 in the last 168 hours Studies/Results: No results found. Medications:      . amLODipine  2.5 mg Oral Daily  . clonazePAM  0.5 mg Oral Daily  . cloNIDine  0.3 mg Transdermal Weekly  . divalproex  1,000 mg Oral Q12H  . famotidine  20 mg Oral Daily  . feeding supplement  237 mL Oral BID  . ferrous sulfate  325 mg Oral TID WC  . hydrALAZINE  25 mg Oral QID  . megestrol  400 mg Oral Daily  . metoprolol tartrate  100 mg Oral BID  . senna  1 tablet Oral BID    I  have reviewed scheduled and prn medications.  Physical Exam:  Blood pressure 194/108, pulse 84, temperature 98.7 F (37.1 C), temperature source Oral, resp. rate 20, last menstrual period 03/02/2011, SpO2 98.00%.  Gen: alert,t, no distress, calm and pleasant Skin: no rash, cyanosis Neck: no JVD, bruits or LAN Chest: fine crackles R base Heart: regular, no rub or gallop Abdomen: soft, nontender Ext: no sig edema Neuro: alert, Ox3, no focal deficit Heme/Lymph: no bruising   Problems: 1.  AKI, proteinuria, low C3 - renal biopsy pending.  Creat stable around 2.0, actually improved some today.   Could be due to malig HTN, or glomerular disease.  2. Malignant HTN - better, on 4 BP meds  Volume excess resolved and making good amounts of urine. Will titrate meds some and add back lasix 3. S/P arrest with anoxic brain injury- per rehab  4. Anemia 4.  Peri-renal hematoma, s/p biopsy, seen at time of bx by [place] per IR -  Hb stable.  No hematuria, asymptomatic.  Follow CBC,     Heather Sable, MD 04/14/2011

## 2011-04-14 NOTE — Progress Notes (Signed)
Recreational Therapy Assessment and Plan  Patient Details  Name: Heather Sims MRN: [id number] Date of Birth: 1990-09-17  Rehab Potential: Good ELOS: 2-3 weeks   Assessment Clinical Impression:  Patient is a 21 y.o. year old female with recent admission to the hospital on 03/25/10 with recent SOB. Patient transferred to CIR on 04/13/2011 . 21 year old right-handed female with recent diagnosis of hypertension as well as upper respiratory infection which she had been placed on Avelox. Admitted January 3 with sudden onset shortness of breath and cardiorespiratory failure with CPR in the field. She was pulseless for approximately 5 minutes and intubated in the emergency department. Underwent hypothermia protocol and suffered seizures upon rewarming. Cranial CT scan and MRI of the brain showed no acute changes. Echocardiogram with ejection fraction 40% and systolic function moderately reduced. TEE with diffuse hypokinesis ejection fraction 45%. Venous Doppler studies lower tremor his were negative. EEG was minimal seizure activity. MRI of cervical thoracic lumbar spine unremarkable. Noted hemoglobin 8.1 and creatinine 2.31 upon admission she was transfused 3 units of packed red blood cells. Renal ultrasound was negative for hydronephrosis. Nephrology consulted felt renal insufficiency due to hypertension. Renal biopsy completed January 18 to complete workup of acute renal insufficiency with results pending. Post operative biopsy with perirenal hematoma and close monitoring with CBC that remained stable. Latest creatinine 1.95 felt to be stabilized. High dose Lasix had been discontinued with stabilizing renal function January 20. Patient had been on subcutaneous heparin for deep vein thrombosis prophylaxis was discontinued January 17 with renal biopsy completed January 18. Placed on valproate 1000 mg every 12 hours per neurology services for seizure disorder. No further seizure activity has been reported. Patient  continued to have bouts of myoclonus of the lower extremities improved after being placed on clonazepam. 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    Patient presents with decreased activity tolerance, decreased functional mobility, decreased balance, ataxia and decreased coordination, decreased attention, decreased memory, decreased problem solving and awareness limiting pt's independence with leisure/community pursuits.     Recreational Therapy Leisure History/Participation Premorbid leisure interest/current participation: Community - Press photographer - Grocery store;Sports - Other (Comment) (horseback riding) Other Leisure Interests: Television;Movies;Videogames;Reading;Computer Leisure Participation Style: With Family/Friends Awareness of Community Resources: Multimedia programmer Appropriate for Education?: Yes Patient Agreeable to [employer]?: Yes Social interaction - Mood/Behavior: Cooperative Recreational Therapy Orientation Orientation -Reviewed with patient: Available activity resources Strengths/Weaknesses Patient Strengths/Abilities: Willingness to participate;Active premorbidly Patient weaknesses: Physical limitations  Plan Rec Therapy Plan Is patient appropriate for Therapeutic Recreation?: Yes Rehab Potential: Good Treatment times per week: min 1 time per week >20 minutes Estimated Length of Stay: 2-3 weeks Therapy Goals Achieved By:: Recreation/leisure participation;Group participation (Comment);Adaptive equipment instruction;Community reintegration/education;1:1 session;Patient/family education  Recommendations for other services: None  Discharge Criteria: Patient will be discharged from TR if patient refuses treatment 3 consecutive times without medical reason.  If treatment goals not met, if there is a change in medical status, if patient makes no progress towards goals or if patient is discharged from  hospital.  The above assessment, treatment plan, treatment alternatives and goals were discussed and mutually agreed upon: by patient  Heather Sims 04/14/2011, 4:20 PM

## 2011-04-14 NOTE — Progress Notes (Signed)
Inpatient Rehabilitation Center Individual Statement of Services  Patient Name:  Heather Sims  Date:  04/14/2011  Welcome to the [facility]patient [facility].  Our goal is to provide you with an individualized program based on your diagnosis and situation, designed to meet your specific needs.  With this comprehensive rehabilitation program, you will be expected to participate in at least 3 hours of rehabilitation therapies Monday-Friday, with modified therapy programming on the weekends.  Your rehabilitation program will include the following services:  Physical Therapy (PT), Occupational Therapy (OT), Speech Therapy (ST), 24 hour per day rehabilitation nursing, Therapeutic Recreaction (TR), Neuropsychology, Case Management (RN and Social Worker), Rehabilitation Medicine, Nutrition Services and Pharmacy Services  Weekly team conferences will be held on  Tuesday  to discuss your progress.  Your RN Case Designer, television/film set will talk with you frequently to get your input and to update you on team discussions.  Team conferences with you and your family in attendance may also be held.  Estimated Length of Stay: about  3 weeks                        Goals: Supervision-min assist  Depending on your progress and recovery, your program may change.  Your RN Case Estate agent will coordinate services and will keep you informed of any changes.  Your RN Sports coach and SW names and contact numbers are listed  below.  The following services may also be recommended but are not provided by the Inpatient [facility] Center:   Driving Evaluations  Home Health Rehabiltiation Services  Outpatient Rehabilitatation [facility]  Vocational Rehabilitation   Arrangements will be made to provide these services after discharge if needed.  Arrangements include referral to agencies that provide these services.  Your insurance has been verified to be:  none Your primary doctor is:   none  Pertinent information will be shared with your doctor and your insurance company.  Case Manager: Melanee Spry, [facility] [phone]  Social Worker:  Amada Jupiter, [place] [phone]  Information discussed with pt's mother and pt and copy given to patient by: Brock Ra, 04/14/2011, 1:01 PM

## 2011-04-14 NOTE — Progress Notes (Signed)
   CARE MANAGEMENT NOTE 04/14/2011  Patient:  Heather Sims, Heather Sims   Account Number:  [id number]  Date Initiated:  03/27/2011  Documentation initiated by:  [facility]  Subjective/Objective Assessment:   Resp failure - post CPR.     Action/Plan:   PTA, PT INDEPENDENT OF ADLS.   Anticipated DC Date:  04/10/2011   Anticipated DC Plan:  HOME W HOME HEALTH SERVICES      DC Planning Services  CM consult      Choice offered to / List presented to:             Status of service:  Completed, signed off Medicare Important Message given?   (If response is "NO", the following Medicare IM given date fields will be blank) Date Medicare IM given:   Date Additional Medicare IM given:    Discharge Disposition:  IP REHAB FACILITY  Per UR Regulation:  Reviewed for med. necessity/level of care/duration of stay  Comments:  04/13/11- 1500- Donn Pierini RN, [name] [phone] Pt to discharge to CIR today.  04/06/10 JULIE AMERSON,RN,[name] [phone] PT EXTUBATED ON 1/10; PHYSICAL THERAPIST RECOMMENDING OT CONSULT AND REHAB CONSULT.  SPOKE WITH BEDSIDE NURSE WHO STATES SHE WILL OBTAIN ORDERS FOR THESE CONSULTS FROM MD. WILL FOLLOW.  04-06-11 2:30pm [name], RNBSN [phone] UR completed.  04-03-11 1:54pm [name], RNBSN [phone] UR completed. now extubated - but seizures - neuro consulting.   03-30-11 2:20pm [name] - [phone] UR completed.  03-27-11 7:35am [name], RNBSWSN [phone] UR Completed.

## 2011-04-14 NOTE — Evaluation (Signed)
Occupational Therapy Assessment and Plan and Therapy Intervention  Patient Details  Name: Heather Sims MRN: [id number] Date of Birth: 1990/05/27  OT Diagnosis: abnormal posture, acute pain, ataxia, cognitive deficits and muscle weakness (generalized) Rehab Potential: Rehab Potential: Good ELOS: 3 weeks   Today's Date: 04/14/2011 Time: 0815-0920 Time Calculation (min): 65 min  Therapy Intervention Treatment Session:  Pt seen for initial evaluation and ADL retraining of bathing and dressing at sink level and toileting.  Pt. needs a great deal of assistance with  ADLs secondary to poor balance, trunk control, poor coordination.  Pt did participate fairly well, but needed frequent rest breaks.  Pt worked on sit to stand at sink and transfers with mod assist.   Assessment & Plan Clinical Impression: Patient is a 21 y.o. year old female with recent admission to the hospital on 03/25/10  with recent SOB.  Patient transferred to CIR on 04/13/2011 .  21 year old right-handed female with recent diagnosis of hypertension as well as upper respiratory infection which she had been placed on Avelox. Admitted January 3 with sudden onset shortness of breath and cardiorespiratory failure with CPR in the field. She was pulseless for approximately 5 minutes and intubated in the emergency department. Underwent hypothermia protocol and suffered seizures upon rewarming. Cranial CT scan and MRI of the brain showed no acute changes. Echocardiogram with ejection fraction 40% and systolic function moderately reduced. TEE with diffuse hypokinesis ejection fraction 45%. Venous Doppler studies lower tremor his were negative. EEG was minimal seizure activity. MRI of cervical thoracic lumbar spine unremarkable. Noted hemoglobin 8.1 and creatinine 2.31 upon admission she was transfused 3 units of packed red blood cells. Renal ultrasound was negative for hydronephrosis. Nephrology consulted felt renal insufficiency due to  hypertension. Renal biopsy completed January 18 to complete workup of acute renal insufficiency with results pending. Post operative biopsy with perirenal hematoma and close monitoring with CBC that remained stable. Latest creatinine 1.95 felt to be stabilized. High dose Lasix had been discontinued with stabilizing renal function January 20. Patient had been on subcutaneous heparin for deep vein thrombosis prophylaxis was discontinued January 17 with renal biopsy completed January 18. Placed on valproate 1000 mg every 12 hours per neurology services for seizure disorder. No further seizure activity has been reported. Patient continued to have bouts of myoclonus of the lower extremities improved after being placed on clonazepam. 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    Patient currently requires total with basic self-care skills secondary to muscle weakness, decreased cardiorespiratoy endurance, unbalanced muscle activation, ataxia and decreased coordination and decreased sitting balance, decreased standing balance, decreased postural control and decreased balance strategies.  Prior to hospitalization, patient could complete all ADLS independently..  Patient will benefit from skilled intervention to increase independence with basic self-care skills prior to discharge home with care partner.  Anticipate patient will require 24 hour supervision and follow up home health.  OT - End of Session Activity Tolerance: Tolerates 10 - 20 min activity with multiple rests Endurance Deficit: Yes Endurance Deficit Description: Pt becomes fatigued very quickly.  Needs frequent rest breaks OT Assessment Rehab Potential: Good Barriers to Discharge: None OT Plan OT Frequency: 1-2 X/day, 60-90 minutes Estimated Length of Stay: 3 weeks OT Treatment/Interventions: Balance/vestibular training;Cognitive  remediation/compensation;DME/adaptive equipment instruction;Functional mobility training;Neuromuscular re-education;Patient/family education;Therapeutic Activities;Self Care/advanced ADL retraining;Therapeutic Exercise;UE/LE Strength taining/ROM;UE/LE Coordination activities OT Recommendation Follow Up Recommendations: Home health OT Equipment  Recommended: Tub/shower bench  Precautions/Restrictions  Precautions Precautions: Fall General Chart Reviewed: Yes Family/Caregiver Present: Yes Vital Signs Therapy Vitals BP: 193/119 mmHg Patient Position, if appropriate: Sitting Pain Pain Assessment Pain Assessment: 0-10 Pain Score:   5 Pain Type: Acute pain (only has pain when getting up) Pain Location: Leg Pain Orientation: Right;Left Pain Descriptors: Aching;Heaviness Pain Onset: With Activity Patients Stated Pain Goal: 0 Pain Intervention(s): RN made aware Home Living/Prior Functioning Independent with all ADLs, college student.  Lives with mom in one story house with 2 stairs.  Has tub/shower combo.   ADL ADL Eating: Supervision/safety;Set up Where Assessed-Eating: Bed level Grooming: Supervision/safety;Setup Where Assessed-Grooming: Wheelchair Upper Body Bathing: Minimal cueing Where Assessed-Upper Body Bathing: Wheelchair Lower Body Bathing: Moderate assistance Where Assessed-Lower Body Bathing: Wheelchair Upper Body Dressing: Setup;Minimal cueing;Supervision/safety Where Assessed-Upper Body Dressing: Wheelchair Lower Body Dressing: Dependent Where Assessed-Lower Body Dressing: Wheelchair;Standing at sink (standing tolerance less than 20 seconds) Toileting: Dependent Where Assessed-Toileting: Teacher, adult education: Moderate assistance Toilet Transfer Method: Stand pivot Toilet Transfer Equipment: Grab bars;Bedside commode (BSC over toilet) Tub/Shower Transfer: Not assessed Film/video editor: Not assessed Vision/Perception  Vision - History Baseline Vision: No  visual deficits Patient Visual Report: No change from baseline Vision - Assessment Eye Alignment: Within Functional Limits Perception Perception: Within Functional Limits (no deficits observed during ADL evaluation) Praxis Praxis: Intact (dressing praxis intact)  Cognition Overall Cognitive Status: Impaired Arousal/Alertness: Lethargic Orientation Level: Oriented X4 Memory: Impaired Memory Impairment: Decreased short term memory;Retrieval deficit Awareness: Impaired Awareness Impairment: Intellectual impairment;Emergent impairment Problem Solving: Impaired Problem Solving Impairment: Functional basic Sensation Sensation Light Touch: Appears Intact Stereognosis: Appears Intact Hot/Cold: Appears Intact Proprioception: Impaired by gross assessment Coordination Gross Motor Movements are Fluid and Coordinated: No Fine Motor Movements are Fluid and Coordinated: No Coordination and Movement Description: ataxic, slow/ labored movement patterns Finger Nose Finger Test: 10 sec test:  5x on right, 6x on left Motor  Motor Motor: Abnormal postural alignment and control;Ataxia Motor - Skilled Clinical Observations: poor trunk control with sitting EOB, generalized weakness Mobility  Bed Mobility Supine to Sit: 3: Mod assist Supine to Sit Details: Manual facilitation for weight shifting;Verbal cues for technique Sitting - Scoot to Edge of Bed: 3: Mod assist Sitting - Scoot to [place] of Bed Details: Manual facilitation for weight bearing;Verbal cues for technique Transfers Sit to Stand: 3: Mod assist Sit to Stand Details: Manual facilitation for weight bearing;Manual facilitation for weight shifting Sit to Stand Details (indicate cue type and reason): pt can only tolerate standing for 20 seconds or less  Trunk/Postural Assessment  Thoracic Assessment Thoracic Assessment: Exceptions to [facility] (kyphotic posture) Lumbar Assessment Lumbar Assessment: Exceptions to [facility] (posterior tilt) Postural  Control Postural Control: Deficits on evaluation Trunk Control: mod assist to maintain balance on EOB  Balance Static Sitting Balance Static Sitting - Balance Support: Bilateral upper extremity supported;Feet unsupported Static Sitting - Level of Assistance: 3: Mod assist Extremity/Trunk Assessment RUE Assessment RUE Assessment: Exceptions to [facility] RUE Strength RUE Overall Strength: Deficits (3+/5) LUE Assessment LUE Assessment: Exceptions to [facility] LUE Strength LUE Overall Strength: Deficits (3+/5)  Recommendations for other services: None  Discharge Criteria: Patient will be discharged from OT if patient refuses treatment 3 consecutive times without medical reason, if treatment goals not met, if there is a change in medical status, if patient makes no progress towards goals or if patient is discharged from hospital.  The above assessment, treatment plan, treatment alternatives and goals were discussed and mutually agreed upon: by patient and  by family  Laken Lobato 04/14/2011, 9:59 AM

## 2011-04-15 DIAGNOSIS — G931 Anoxic brain damage, not elsewhere classified: Secondary | ICD-10-CM

## 2011-04-15 DIAGNOSIS — R5381 Other malaise: Secondary | ICD-10-CM

## 2011-04-15 DIAGNOSIS — R569 Unspecified convulsions: Secondary | ICD-10-CM

## 2011-04-15 DIAGNOSIS — Z5189 Encounter for other specified aftercare: Secondary | ICD-10-CM

## 2011-04-15 DIAGNOSIS — I509 Heart failure, unspecified: Secondary | ICD-10-CM

## 2011-04-15 LAB — URINALYSIS, ROUTINE W REFLEX MICROSCOPIC
Bilirubin Urine: NEGATIVE
Glucose, UA: NEGATIVE mg/dL
Ketones, ur: NEGATIVE mg/dL
Protein, ur: >300 mg/dL — AB
Urobilinogen, UA: 0.2 mg/dL (ref 0.0–1.0)

## 2011-04-15 LAB — URINE MICROSCOPIC-ADD ON

## 2011-04-15 LAB — URINE CULTURE
Colony Count: NO GROWTH
Culture: NO GROWTH

## 2011-04-15 MED ORDER — CIPROFLOXACIN HCL 500 MG PO TABS
500.0000 mg | ORAL_TABLET | Freq: Two times a day (BID) | ORAL | Status: DC
  Administered 2011-04-16: 500 mg via ORAL
  Filled 2011-04-15 (×4): qty 1

## 2011-04-15 NOTE — Progress Notes (Signed)
Patient ID: Heather Sims, female   DOB: 01/25/1991, 21 y.o.   MRN: [id number] Patient ID: Heather Sims, female   DOB: November 25, 1990, 21 y.o.   MRN: [id number] Subjective/Complaints: Review of Systems  Constitutional: Positive for malaise/fatigue.  Neurological: Positive for tremors.  All other systems reviewed and are negative.  1/23---ate better yesterday.  Low grade temop Objective: Vital Signs: Blood pressure 157/84, pulse 78, temperature 99.2 F (37.3 C), temperature source Oral, resp. rate 19, last menstrual period 03/02/2011, SpO2 99.00%. No results found.  Basename 04/13/11 2025 04/13/11 0500  WBC 8.4 8.5  HGB 9.9* 8.5*  HCT 29.2* 25.5*  PLT 352 319    Basename 04/14/11 0650 04/13/11 2025  NA 141 141  K 4.0 4.0  CL 105 104  CO2 25 26  GLUCOSE 95 108*  BUN 65* 65*  CREATININE 1.82* 1.94*  CALCIUM 8.1* 8.0*   CBG (last 3)  No results found for this basename: GLUCAP:3 in the last 72 hours  Wt Readings from Last 3 Encounters:  04/10/11 69.536 kg (153 lb 4.8 oz)    Physical Exam:  General appearance: no distress and slowed mentation Head: Normocephalic, without obvious abnormality, atraumatic Eyes: conjunctivae/corneas clear. PERRL, EOM's intact. Fundi benign. Ears: normal TM's and external ear canals both ears Nose: Nares normal. Septum midline. Mucosa normal. No drainage or sinus tenderness. Throat: lips, mucosa, and tongue normal; teeth and gums normal Neck: no adenopathy, no carotid bruit, no JVD, supple, symmetrical, trachea midline and thyroid not enlarged, symmetric, no tenderness/mass/nodules Back: symmetric, no curvature. ROM normal. No CVA tenderness. Resp: clear to auscultation bilaterally Cardio: regular rate and rhythm, S1, S2 normal, no murmur, click, rub or gallop and normal apical impulse GI: soft, non-tender; bowel sounds normal; no masses,  no organomegaly Extremities: trace le edema.  pulses 2+ Pulses: 2+ and symmetric Skin: Skin color, texture,  turgor normal. No rashes or lesions Neurologic: delayed processing and word finding deficits. A little sleepy this am  Oriented to name and hospital. Speech slurred and low volume.  UE's grossly 2+ to 3+/5.  LE's 2/5 prox to 3/5 distally.  Withdraws to pain on all 4's. DTR's 2-3+.  Incision/Wound: n/a  exam updated 1/23  Assessment/Plan: 1. Functional deficits secondary to anoxic brain injury which require 3+ hours per day of interdisciplinary therapy in a comprehensive inpatient rehab setting. Physiatrist is providing close team supervision and 24 hour management of active medical problems listed below. Physiatrist and rehab team continue to assess barriers to discharge/monitor patient progress toward functional and medical goals. Mobility: Bed Mobility Supine to Sit: 3: Mod assist Sitting - Scoot to Edge of Bed: 3: Mod assist Transfers Sit to Stand: 3: Mod assist Sit to Stand Details (indicate cue type and reason): pt can only tolerate standing for 20 seconds or less     ADL:   Cognition: Cognition Overall Cognitive Status: Impaired Arousal/Alertness: Lethargic Orientation Level: Oriented to person;Oriented to place;Oriented to time;Disoriented to situation;Other (Comment) (Orientation fluctuates) Attention: Sustained Focused Attention: Appears intact Sustained Attention: Impaired Sustained Attention Impairment: Functional basic;Verbal basic Memory: Impaired Memory Impairment: Decreased short term memory;Decreased recall of new information Decreased Short Term Memory: Verbal basic;Functional basic Awareness: Impaired Awareness Impairment: Emergent impairment Problem Solving: Impaired Problem Solving Impairment: Functional basic Executive Function: Self Monitoring;Self Correcting;Organizing Reasoning: Impaired Reasoning Impairment: Verbal basic;Functional basic Organizing: Impaired Organizing Impairment: Verbal basic;Functional basic Self Monitoring: Impaired Self  Monitoring Impairment: Verbal basic Self Correcting: Impaired Self Correcting Impairment: Verbal basic Safety/Judgment: Impaired Cognition Arousal/Alertness: Lethargic Orientation Level:  Oriented to person;Oriented to place;Oriented to time;Disoriented to situation;Other (Comment) (Orientation fluctuates)  1 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  2. DVT Prophylaxis/Anticoagulation: SCDs.  3. Seizure disorder. Valproate 1000 mg every 12 hours. Latest valproic acid level January 15 of 42.6. Monitor closely for any increased seizure activity. 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  Check VPA level tomorrow. 4. Hypertension.  Norvasc increased to 10mg . clonidine patch 0.3 mg change weekly, hydralazine 25 mg 4 times daily, Lopressor 100 mg twice daily. Monitor closely with increased activity. I'm hopeful that norvasc adjustment will stabilize bp.  5 Renal failure. 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 Await renal biopsy completed January 18.. Strict I and O.'s for now. 6. Nutrition: Continue to encourage intake. Consider appetite stimulant. Valproic acid should help with appetite as well.  -megace seems to have helped 7. Cardiomyopathy--?hypertensive vs viral  -follow up per cards 8. Low grade temp--check UA C&S. IS   LOS (Days) 2 A FACE TO FACE EVALUATION WAS PERFORMED  Masson Nalepa T 04/15/2011, 7:40 AM

## 2011-04-15 NOTE — Progress Notes (Signed)
Speech Language Pathology Therapy Note  Patient Details  Name: Heather Sims MRN: [id number] Date of Birth: 1991-02-02  Today's Date: 04/15/2011 Time: 0920-1015 Time Calculation (min): 55 min  Precautions: Precautions Precautions: Fall Restrictions Weight Bearing Restrictions: No  Skilled Therapeutic Interventions: Treatment focus on functional problem solving and organization. Pt very lethargic throughout session and required extra time to perform grooming tasks (brushing teeth, putting on lotion) with Min verbal cues to initiate tasks. Clinical observation of interaction between the food ambassador and the pt for meal selection revealed disorganized thoughts with Max A question cues to express wants/needs. Pt aware of confusion/disoganization. Max A verbal and question cues needed to navigate/locate items on menu and to organize meals appropriately. Pt frustrated by end of session.    Pain: No/denies Pain  Therapy/Group: Individual Therapy  [name] 04/15/2011 1:08 PM
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
## 2011-04-15 NOTE — Progress Notes (Signed)
Pt has been continent bowel and bladder, verbalizing needs to staff at [facility], has not used call bell for needs; LBM this am per report; Mod assist of 1-2, varies with level of fatigue, distraction; flat affect;  Fine tremors evident; myoclonic movements BLE, Lt > Rt; denies pain, poor appetite, megace started per orders. Carlean Purl

## 2011-04-15 NOTE — Progress Notes (Signed)
Physical Therapy Note  Patient Details  Name: Heather Sims MRN: [id number] Date of Birth: 1990-05-11 Today's Date: 04/15/2011  Time: 1130-1155 25 minutes  No c/o pain. Pt c/o fatigue.  Car transfer training with mod A to sedan height car.  Pt requires A and cues for safe technique and sequencing.  Able to progress to supervision to lift LE's into car after multiple attempts.  W/c mobility with B UEs with min A for steering, pt requires rest approx. Every 50'.  Seated balance with functional reaching and bending activities with min A for trunk control. Pt limited by myoclonus during all mobility, trunk extension during transfers, decreased activity tolerance.  Pt max A back to bed at end of session.  Individual therapy   Liat Mayol 04/15/2011, 1:45 PM

## 2011-04-15 NOTE — Progress Notes (Signed)
Occupational Therapy Session Note  Patient Details  Name: Heather Sims MRN: [id number] Date of Birth: Apr 03, 1990  Today's Date: 04/15/2011 Time: 1015-1130 Time Calculation (min): 75 min  Precautions: Precautions Precautions: Fall Restrictions Weight Bearing Restrictions: No  Short Term Goals: OT Short Term Goal 1: Pt will tolerate standing at sink for 45 seconds to enable her to be more independent with LB adls. OT Short Term Goal 2: Pt will bathe with min assist. OT Short Term Goal 3: Pt will don underwear and pants with min assist. OT Short Term Goal 4: Pt will transfer to toilet with min assist. OT Short Term Goal 5: Pt will demonstrate improved static sitting balance of supervision to be able to bathe safely on tub bench.  Skilled Therapeutic Interventions/Progress Updates:    self care retraining: focusing on dressing only with an emphasis on sit to stand, standing balance with UE support, standing tolerance, controlled descents, simple problem solving, answering simple familiar orientation questions with cues for processing and choices for attention, following one step commands with context cues, transfers (squat pivot), standing with decreased posturing, core strengthening, sitting EOB/ balance during a functional table top activity, activity tolerance.   Vital Signs Therapy Vitals BP: 107/72 during sitting activity edge of mat BP: 115/72 mmHg (after sit to stand activity ) Patient Position, if appropriate: Sitting Pain Pain Assessment Pain Assessment: 0-10 Pain Score: 0-No pain  Therapy/Group: Individual Therapy  [name] 04/15/2011, 11:38 AM

## 2011-04-15 NOTE — Progress Notes (Signed)
Occupational Therapy Session Note  Patient Details  Name: Heather Sims MRN: [id number] Date of Birth: February 20, 1991  Today's Date: 04/15/2011 Time: 4098-1191 Time Calculation (min): 45 min  Precautions: Precautions Precautions: Fall Restrictions Weight Bearing Restrictions: No  Short Term Goals: OT Short Term Goal 1: Pt will tolerate standing at sink for 45 seconds to enable her to be more independent with LB adls. OT Short Term Goal 2: Pt will bathe with min assist. OT Short Term Goal 3: Pt will don underwear and pants with min assist. OT Short Term Goal 4: Pt will transfer to toilet with min assist. OT Short Term Goal 5: Pt will demonstrate improved static sitting balance of supervision to be able to bathe safely on tub bench.  Skilled Therapeutic Interventions/Progress Updates:   1:1 focus on cognition: following one step directions, simple problem solving, selective attention in a min distracting environment, awareness, etc. Pt sat at the edge of her w/c (unsupported setting) to engage in making pudding at table in ADL apartment. Pt needed to lean back and take breaks frequently due to fatigue (cogntivly and physically). Pt needed max verbal and written cuing to sequence and organize the tasks, emergent awareness and anticipating our needs for completing task tomorrow. Pt needed max cuing for recalling date.  Pain Pain Assessment Pain Assessment: No/denies pain Pain Score: 0-No pain   Therapy/Group: Individual Therapy  [name] 04/15/2011, 3:05 PM

## 2011-04-15 NOTE — Progress Notes (Signed)
Patient has a temperature of 99.2 F this morning.  Deatra Ina, PA notified.  No new orders received.  Will continue to monitor.
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
## 2011-04-16 ENCOUNTER — Inpatient Hospital Stay (HOSPITAL_COMMUNITY): Payer: Medicaid Other

## 2011-04-16 DIAGNOSIS — M7989 Other specified soft tissue disorders: Secondary | ICD-10-CM

## 2011-04-16 DIAGNOSIS — M79609 Pain in unspecified limb: Secondary | ICD-10-CM

## 2011-04-16 LAB — CBC
HCT: 24.5 % — ABNORMAL LOW (ref 36.0–46.0)
Hemoglobin: 8.1 g/dL — ABNORMAL LOW (ref 12.0–15.0)
MCHC: 33.1 g/dL (ref 30.0–36.0)
MCV: 89.1 fL (ref 78.0–100.0)
WBC: 11.6 10*3/uL — ABNORMAL HIGH (ref 4.0–10.5)

## 2011-04-16 LAB — RENAL FUNCTION PANEL
BUN: 71 mg/dL — ABNORMAL HIGH (ref 6–23)
CO2: 24 mEq/L (ref 19–32)
Calcium: 7.9 mg/dL — ABNORMAL LOW (ref 8.4–10.5)
GFR calc Af Amer: 30 mL/min — ABNORMAL LOW (ref >90)
Glucose, Bld: 97 mg/dL (ref 70–99)
Phosphorus: 4.6 mg/dL (ref 2.3–4.6)
Sodium: 141 mEq/L (ref 135–145)

## 2011-04-16 LAB — VALPROIC ACID LEVEL: Valproic Acid Lvl: 79.3 ug/mL (ref 50.0–100.0)

## 2011-04-16 MED ORDER — CLONIDINE HCL 0.1 MG/24HR TD PTWK
0.1000 mg | MEDICATED_PATCH | TRANSDERMAL | Status: DC
  Administered 2011-04-21: 0.1 mg via TRANSDERMAL
  Filled 2011-04-16: qty 1

## 2011-04-16 MED ORDER — DARBEPOETIN ALFA-POLYSORBATE 100 MCG/0.5ML IJ SOLN
100.0000 ug | INTRAMUSCULAR | Status: DC
  Administered 2011-04-16 – 2011-04-23 (×2): 100 ug via SUBCUTANEOUS
  Filled 2011-04-16 (×2): qty 0.5

## 2011-04-16 MED ORDER — METOPROLOL TARTRATE 100 MG PO TABS
100.0000 mg | ORAL_TABLET | Freq: Two times a day (BID) | ORAL | Status: DC
  Administered 2011-04-16 – 2011-04-28 (×24): 100 mg via ORAL
  Filled 2011-04-16 (×29): qty 1

## 2011-04-16 MED ORDER — ACETAMINOPHEN 325 MG PO TABS: 650.0000 mg | ORAL_TABLET | Freq: Once | ORAL | Status: DC

## 2011-04-16 MED ORDER — MOXIFLOXACIN HCL 400 MG PO TABS
400.0000 mg | ORAL_TABLET | Freq: Every day | ORAL | Status: DC
  Administered 2011-04-16 – 2011-04-23 (×8): 400 mg via ORAL
  Filled 2011-04-16 (×9): qty 1

## 2011-04-16 NOTE — Progress Notes (Signed)
Preliminary  Preliminary   Preliminary   Bilateral LEV duplex performed.    Negative for DVT bilaterally.  Florestine Avers, RVT Chief Vascular Sonographer

## 2011-04-16 NOTE — Progress Notes (Signed)
Physical Therapy Note  Patient Details  Name: Heather Sims MRN: [id number] Date of Birth: Dec 30, 1990 Today's Date: 04-17-2011  Time: 1515-1540 25 minutes  No c/o pain.  Pt sleeping when therapist arrived, slow to arouse. Once awake pt agreeable to participate in therapy.  Transfer training with focus on fwd wt shift.  Pt with improved fwd wt shift and decreased extension this PM.  Sit<>stand multiple attempts with hands together to improve trunk control, focus wt on LEs.  Pt required multiple attempts to achieve with manual facilitation for fwd wt shift.  Pt able to perform 3x full stands with mod A and hold 3-5 seconds without UE support before fatigue.  Seated balance to don/doff shirt with min cues for sequencing, min A for balance due to tremors and fatigue.  Individual therapy   Shaquille Murdy 17-Apr-2011, 3:53 PM

## 2011-04-16 NOTE — Progress Notes (Signed)
Occupational Therapy Session Note  Patient Details  Name: Heather Sims MRN: [id number] Date of Birth: [birth year]  Today's Date: 04/09/2011 Time: 1100-1200 Time Calculation (min): 60 min  Precautions: Precautions Precautions: Fall Restrictions Weight Bearing Restrictions: No  Short Term Goals: OT Short Term Goal 1: Pt will tolerate standing at sink for 45 seconds to enable her to be more independent with LB adls. OT Short Term Goal 2: Pt will bathe with min assist. OT Short Term Goal 3: Pt will don underwear and pants with min assist. OT Short Term Goal 4: Pt will transfer to toilet with min assist. OT Short Term Goal 5: Pt will demonstrate improved static sitting balance of supervision to be able to bathe safely on tub bench.  Skilled Therapeutic Interventions/Progress Updates:    self care retraining at shower level: including bed mobility, transfers, controlled trunk/ LE movements with functional tasks, intellectual awareness, sustained attention, sequencing and task organization with max contextual cuing. Pt demonstrated increased clonus in bilateral LEs, noted in left UE with functional tasks. After ADL- stood in standing frame with external tactile cuing to calm systems, closed chain standing with attention to trunk control in static standing; able to stand for longer period of time -30 sec with increased control with decreased trunk compensation and posterior lean 4 times.  Pain C/o pain in calf - relief with rest     14:00-14:45 ( ) Second session: focus on functional task of completing making pudding in the kitchen with written steps.  Required max to total cuing to complete each step and sequence through steps. Also focus on core trunk stability sitting on EOB of chair with bilateral feet firmly positioned on the floor while using bilateral hands for activity. Stood in standing frame again playing tic tac toe on the mirror working to engage core muscles, upright posture,  knee extension in standing with decreased compensation in trunk/ UE.  Able to stand for 45sec to 1 min at a time with mod cuing for posture. Sit to stand performed 5x   Both sessions Therapy/Group: Individual Therapy  [name] 04/04/2011, 3:07 PM

## 2011-04-16 NOTE — Progress Notes (Signed)
Progress Notes  Speech Language Pathology Therapy Note   Patient Details  Name: Heather Sims MRN: [id number] Date of Birth: 1990/11/06  Today's Date: 03/26/2011 Time: 1:02 - 1:45   Precautions: Precautions Precautions: Fall Restrictions Weight Bearing Restrictions: No  Short Term Goals 1. Pt will sustain attention to a functional task for 10 minutes with Mod verbal cues for redirection.  2. Pt will utilize external memory aids (schedule, calendar, etc.) to demonstrate recall of daily events with Min A verbal and question cues.  3. Pt will demonstrate functional problem solving with basic and familiar tasks with Mod verbal and visual cues.  4. Pt will demonstrate emergent awareness of cognitive deficits during functional and familiar tasks with Mod A verbal and question cues.  :  Skilled Therapeutic Interventions/Progress Updates:  Precautions/Restrictions: Fall   General  Pt participated in sorting coins task. She required frequent verbal and visual cues to sustain attention to task. Redirection required for this task every 2-3 minutes. Pt counted out specific simple change amounts with frequent verbal and visual cues. Pt stated physical deficits with minimal questioning cues, she stated "I think slower" and "my mind is fuzzy" with moderate questioning cues and after discussion about memory loss and awareness after BI. Pt did ask after this discussion what would be the next venue for therapy after she leaves "Will I come back here after I go home?" demonstrating some anticipatory awareness, likely fleeting at this point. Pt required verbal cues to look at calendar. She demonstrated difficulty reading calendar and identifying day, mixing up the columns on the calendar in the ST room. She was unable to read the clock despite max cues such as we just had lunch and it is not yet dinner.  She stated "I can't read clocks like that."    Vital Signs Therapy Vitals Temp: 97.7 F (36.5 C) Temp  src: Oral Pulse Rate: 80  Resp: 17  BP: 152/85 mmHg Patient Position, if appropriate: Sitting Pain denies pain   Therapy/Group: Individual Therapy  Alice Reichert Radene Journey 04/01/2011 2:37 PM Patient Details  Name: Heather Sims MRN: [id number] Date of Birth: [birth year]  Today's Date: 03/25/2011    Precautions: Precautions Precautions: Fall Restrictions Weight Bearing Restrictions: No      Vital Signs Therapy Vitals Temp: 97.7 F (36.5 C) Temp src: Oral Pulse Rate: 80  Resp: 17  BP: 152/85 mmHg Patient Position, if appropriate: Sitting     Therapy/Group: Individual Therapy  Heather Sims, Radene Journey 04/17/2011 2:35 PM

## 2011-04-16 NOTE — Progress Notes (Signed)
Subjective/Complaints: Review of Systems  Constitutional: Positive for malaise/fatigue.  Neurological: Positive for tremors.  All other systems reviewed and are negative.  1/24---fever. Had some chills but no other sx. Some left leg swelling  Objective: Vital Signs: Blood pressure 133/71, pulse 86, temperature 101.1 F (38.4 C), temperature source Oral, resp. rate 19, weight 67.858 kg (149 lb 9.6 oz), last menstrual period 03/02/2011, SpO2 98.00%. Dg Chest Port 1 View  04/02/2011  *RADIOLOGY REPORT*  Clinical Data: Shortness of breath.  PORTABLE CHEST - 1 VIEW  Comparison: Chest radiograph performed 04/07/2011  Findings: The lungs are well-aerated.  Vascular congestion is noted, with bilateral central airspace opacities, worse on the left.  This may reflect slightly worsening pneumonia or possibly asymmetric interstitial edema.  There is no evidence of pleural effusion or pneumothorax.  The cardiomediastinal silhouette remains enlarged, somewhat globular in appearance.  No acute osseous abnormalities are seen.  IMPRESSION: Vascular congestion and cardiomegaly, with bilateral central airspace opacities, worse on the left.  This may reflect slightly worsening pneumonia or possibly asymmetric interstitial edema.  Original Report Authenticated By: Tonia Ghent, M.D.    Basename 04/15/2011 0636 04/13/11 2025  WBC 11.6* 8.4  HGB 8.1* 9.9*  HCT 24.5* 29.2*  PLT 313 352    Basename 04/14/11 0650 04/13/11 2025  NA 141 141  K 4.0 4.0  CL 105 104  CO2 25 26  GLUCOSE 95 108*  BUN 65* 65*  CREATININE 1.82* 1.94*  CALCIUM 8.1* 8.0*   CBG (last 3)  No results found for this basename: GLUCAP:3 in the last 72 hours  Wt Readings from Last 3 Encounters:  04/15/11 67.858 kg (149 lb 9.6 oz)  04/10/11 69.536 kg (153 lb 4.8 oz)    Physical Exam:  General appearance: no distress and slowed mentation Head: Normocephalic, without obvious abnormality, atraumatic Eyes: conjunctivae/corneas clear.  PERRL, EOM's intact. Fundi benign. Ears: normal TM's and external ear canals both ears Nose: Nares normal. Septum midline. Mucosa normal. No drainage or sinus tenderness. Throat: lips, mucosa, and tongue normal; teeth and gums normal Neck: no adenopathy, no carotid bruit, no JVD, supple, symmetrical, trachea midline and thyroid not enlarged, symmetric, no tenderness/mass/nodules Back: symmetric, no curvature. ROM normal. No CVA tenderness. Resp: clear to auscultation bilaterally Cardio: regular rate and rhythm, S1, S2 normal, no murmur, click, rub or gallop and normal apical impulse GI: soft, non-tender; bowel sounds normal; no masses,  no organomegaly Extremities: LLE with 1+ edema.  Trace to 1 on the RLE Pulses: 2+ and symmetric Skin: Skin color, texture, turgor normal. No rashes or lesions Neurologic: delayed processing and word finding deficits still.   Oriented to name and hospital. Speech slurred and low volume.  UE's grossly 2+ to 3+/5.  LE's 2/5 prox to 3/5 distally.  Withdraws to pain on all 4's. DTR's 2-3+.  Incision/Wound: n/a  exam updated 1/24  Assessment/Plan: 1. Functional deficits secondary to anoxic brain injury which require 3+ hours per day of interdisciplinary therapy in a comprehensive inpatient rehab setting. Physiatrist is providing close team supervision and 24 hour management of active medical problems listed below. Physiatrist and rehab team continue to assess barriers to discharge/monitor patient progress toward functional and medical goals. Mobility: Bed Mobility Supine to Sit: 3: Mod assist Sitting - Scoot to Edge of Bed: 3: Mod assist Transfers Sit to Stand: 3: Mod assist Sit to Stand Details (indicate cue type and reason): pt can only tolerate standing for 20 seconds or less     ADL:  Cognition: Cognition Overall Cognitive Status: Impaired Arousal/Alertness: Lethargic Orientation Level: Oriented X4 Attention: Sustained Focused Attention: Appears  intact Sustained Attention: Impaired Sustained Attention Impairment: Functional basic;Verbal basic Memory: Impaired Memory Impairment: Decreased short term memory;Decreased recall of new information Decreased Short Term Memory: Verbal basic;Functional basic Awareness: Impaired Awareness Impairment: Emergent impairment Problem Solving: Impaired Problem Solving Impairment: Functional basic Executive Function: Self Monitoring;Self Correcting;Organizing Reasoning: Impaired Reasoning Impairment: Verbal basic;Functional basic Organizing: Impaired Organizing Impairment: Verbal basic;Functional basic Self Monitoring: Impaired Self Monitoring Impairment: Verbal basic Self Correcting: Impaired Self Correcting Impairment: Verbal basic Safety/Judgment: Impaired Cognition Arousal/Alertness: Lethargic Orientation Level: Oriented X4  1 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  2. DVT Prophylaxis/Anticoagulation: SCDs.dopplers today. 3. Seizure disorder. Valproate 1000 mg every 12 hours. Latest valproic acid level January 15 of 42.6. Monitor closely for any increased seizure activity. 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  Check VPA level tomorrow. 4. Hypertension.  Norvasc increased to 10mg . clonidine patch 0.3 mg change weekly, hydralazine 25 mg 4 times daily, Lopressor 100 mg twice daily. Monitor closely with increased activity. I'm hopeful that norvasc adjustment will stabilize bp.  5 Renal failure. 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 Await renal biopsy completed January 18.. Strict I and O.'s for now. 6. Nutrition: Continue to encourage intake. Consider appetite stimulant. Valproic acid should help with appetite as well.  -megace seems to have helped 7. Cardiomyopathy--?hypertensive vs viral  -follow up per cards 8. Temp- (101.8)  Empiric cipro started. ucx pending  -cxr with edema vs infiltrate.  Doesn't look a lot  different to me from imaging on 1/15 --cipro  -bcx if spikes again greater than 101  -dopplers today  -recheck labs in am   LOS (Days) 3 A FACE TO FACE EVALUATION WAS PERFORMED  SWARTZ,ZACHARY T 04/09/2011, 7:28 AM

## 2011-04-16 NOTE — Progress Notes (Signed)
Physical Therapy Note  Patient Details  Name: Heather Sims MRN: [id number] Date of Birth: 03/05/1991 Today's Date: 04/17/2011  Time: 855-927 33 minutes  No c/o pain.  Pt with increased lethargy today, required increased cuing to arouse.  Repetitive sit to stands with mod A for functional dressing task.  Pt able to pull up pants with max A for standing balance.  Bed <> chair transfers mod A mulitiple repetitions with focus on fwd wt shift, pt continues to go into increased trunk extension during transfers.  Sit to stand training with hands flat on table, pt unable to pull up with mod A, max A for balance without UE support, min A with B UE support.  Attempt mini squats to increase LE control, pt unable due to "L leg is giving out".  Pt required frequent rest breaks due to c/o fatigue.  HR 84bpm during tx.  Individual therapy   DONAWERTH,KAREN 04/03/2011, 10:28 AM

## 2011-04-16 NOTE — Progress Notes (Signed)
Subjective: No complaints.  UOP only recorded as 300 out/24 hours, however states she is still urinating "normally". LE edema improved Renal biopsy pending.  Had temp, u/a not looking like UTI  Objective Vital signs in last 24 hours: Filed Vitals:   04/15/11 1300 04/15/11 1626 04/15/11 2329 04/15/2011 0128  BP:  104/64 128/68 133/71  Pulse:  77 70 86  Temp:  98.8 F (37.1 C) 101.8 F (38.8 C) 101.1 F (38.4 C)  TempSrc:  Oral Oral Oral  Resp:  22 18 19   Weight: 149 lb 9.6 oz (67.858 kg)     SpO2:  98% 98% 98%   Weight change:   Intake/Output Summary (Last 24 hours) at 03/30/2011 0702 Last data filed at 04/15/11 1400  Gross per 24 hour  Intake    340 ml  Output    300 ml  Net     40 ml   Labs: Basic Metabolic Panel:  Lab 04/06/2011 1610 04/14/11 0650 04/13/11 2025 04/13/11 0500 04/12/11 0500 04/11/11 0500 04/10/11 0610  NA 141 141 141 141 141 141 138  K 4.6 4.0 4.0 3.8 3.8 3.8 4.0  CL 107 105 104 106 106 107 106  CO2 24 25 26 25 25 24 21   GLUCOSE 97 95 108* 92 83 109* 93  BUN 71* 65* 65* 72* 73* 77* 84*  CREATININE 2.55* 1.82* 1.94* 1.95* 1.99* 2.01* 2.31*  ALB -- -- -- -- -- -- --  CALCIUM 7.9* 8.1* 8.0* 7.9* 7.8* 8.0* 7.9*  PHOS 4.6 4.6 -- 5.1* 4.4 4.8* --   Liver Function Tests:  Lab 04/14/11 0650 04/13/11 2025 04/13/11 0500  AST -- 26 --  ALT -- 12 --  ALKPHOS -- 50 --  BILITOT -- 0.1* --  PROT -- 5.2* --  ALBUMIN 1.7* 1.7* 1.5*   No results found for this basename: LIPASE:3,AMYLASE:3 in the last 168 hours No results found for this basename: AMMONIA:3 in the last 168 hours CBC:  Lab 03/25/2011 0636 04/13/11 2025 04/13/11 0500 04/12/11 0500  WBC 11.6* 8.4 8.5 10.6*  NEUTROABS -- 5.7 -- --  HGB 8.1* 9.9* 8.5* 8.3*  HCT 24.5* 29.2* 25.5* 25.4*  MCV 89.1 89.0 89.2 88.5  PLT 313 352 319 311   PT/INR: @labrcntip (inr:5) Cardiac Enzymes: No results found for this basename: CKTOTAL:5,CKMB:5,CKMBINDEX:5,TROPONINI:5 in the last 168 hours CBG: No results found for  this basename: GLUCAP:5 in the last 168 hours  Iron Studies: No results found for this basename: IRON:30,TIBC:30,TRANSFERRIN:30,FERRITIN:30 in the last 168 hours Studies/Results: Dg Chest Port 1 View  04/12/2011  *RADIOLOGY REPORT*  Clinical Data: Shortness of breath.  PORTABLE CHEST - 1 VIEW  Comparison: Chest radiograph performed 04/07/2011  Findings: The lungs are well-aerated.  Vascular congestion is noted, with bilateral central airspace opacities, worse on the left.  This may reflect slightly worsening pneumonia or possibly asymmetric interstitial edema.  There is no evidence of pleural effusion or pneumothorax.  The cardiomediastinal silhouette remains enlarged, somewhat globular in appearance.  No acute osseous abnormalities are seen.  IMPRESSION: Vascular congestion and cardiomegaly, with bilateral central airspace opacities, worse on the left.  This may reflect slightly worsening pneumonia or possibly asymmetric interstitial edema.  Original Report Authenticated By: Tonia Ghent, M.D.   Medications:      . acetaminophen  650 mg Oral Once  . amLODipine  10 mg Oral Daily  . ciprofloxacin  500 mg Oral BID  . clonazePAM  0.5 mg Oral Daily  . cloNIDine  0.3 mg Transdermal Weekly  .  divalproex  1,000 mg Oral Q12H  . famotidine  20 mg Oral Daily  . feeding supplement  237 mL Oral BID  . ferrous sulfate  325 mg Oral TID WC  . furosemide  40 mg Oral BID  . hydrALAZINE  50 mg Oral QID  . megestrol  400 mg Oral Daily  . metoprolol tartrate  100 mg Oral BID  . senna  1 tablet Oral BID    I  have reviewed scheduled and prn medications.  Physical Exam:  Blood pressure 133/71, pulse 86, temperature 101.1 F (38.4 C), temperature source Oral, resp. rate 19, weight 149 lb 9.6 oz (67.858 kg), last menstrual period 03/02/2011, SpO2 98.00%.  Gen: alert,t, no distress, calm and pleasant Neck: no JVD, bruits or LAN Chest: CTAB Heart: regular, no rub or gallop Abdomen: soft, nontender Ext: Tr  LE edema Neuro: alert, Ox3, no focal deficit   Problems: 1. AKI, proteinuria, low C3 - Awaiting results of renal biopsy.  Creat starting to go back up 1.84-->2.55, continue to follow.  Febrile but UA does not indicate UTI.  CXR with possible PNA. Initial kidney injury likely due to malig HTN, or glomerular disease. 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  I think most recent increase in creatinine is due to overcontrol of BP, will modify meds and aim for BP 130-160. 2. Malignant HTN - better, on 4 BP meds  Volume excess resolved and making good amounts of urine. Continue lasix. BP looks good today. overcontrolled 3. S/P arrest with anoxic brain injury- per rehab  4. Anemia 5.  Peri-renal hematoma, s/p biopsy, seen at time of bx by [place] per IR -  Hb stable.  No hematuria, asymptomatic.  CBC with mild drop in Hb 9.9->8.1. Add aranesp 6. Fever with CXR findings- not that suggestive of UTI.  Will change antibiotic to avelox to cover both    Everrett Coombe, DO PGY-2  Patient seen and examined, agree with above note with above modifications.  Annie Sable, MD May 16, 2011

## 2011-04-17 ENCOUNTER — Inpatient Hospital Stay (HOSPITAL_COMMUNITY): Payer: Medicaid Other

## 2011-04-17 DIAGNOSIS — I509 Heart failure, unspecified: Secondary | ICD-10-CM

## 2011-04-17 DIAGNOSIS — R569 Unspecified convulsions: Secondary | ICD-10-CM

## 2011-04-17 DIAGNOSIS — R5381 Other malaise: Secondary | ICD-10-CM

## 2011-04-17 DIAGNOSIS — G931 Anoxic brain damage, not elsewhere classified: Secondary | ICD-10-CM

## 2011-04-17 DIAGNOSIS — Z5189 Encounter for other specified aftercare: Secondary | ICD-10-CM

## 2011-04-17 LAB — BASIC METABOLIC PANEL
CO2: 23 mEq/L (ref 19–32)
Chloride: 107 mEq/L (ref 96–112)
GFR calc Af Amer: 30 mL/min — ABNORMAL LOW (ref >90)
Potassium: 4.5 mEq/L (ref 3.5–5.1)
Sodium: 141 mEq/L (ref 135–145)

## 2011-04-17 LAB — CBC
MCV: 90.1 fL (ref 78.0–100.0)
Platelets: 324 10*3/uL (ref 150–400)
RBC: 2.74 MIL/uL — ABNORMAL LOW (ref 3.87–5.11)
WBC: 12 10*3/uL — ABNORMAL HIGH (ref 4.0–10.5)

## 2011-04-17 LAB — DIFFERENTIAL
Eosinophils Relative: 1 % (ref 0–5)
Lymphocytes Relative: 17 % (ref 12–46)
Lymphs Abs: 2 10*3/uL (ref 0.7–4.0)
Monocytes Relative: 17 % — ABNORMAL HIGH (ref 3–12)
Neutrophils Relative %: 65 % (ref 43–77)

## 2011-04-17 NOTE — Progress Notes (Signed)
Physical Therapy Note  Patient Details  Name: Heather Sims MRN: [id number] Date of Birth: 1990/09/18 Today's Date: 04/17/2011  Time: 1300-1327 27 minutes  No c/o pain.  Treatment focused on closed chain activities with scooting, standing, mini squats, wt shifts with manual facilitation for trunk and hip control and coordination and stability.  Pt with improved control in closed chain activities.  Difficulty grading wt shifts and with eccentric control stand to sit.  Improved activity tolerance and decreased lethargy this pm.  Indivudal therapy   Carder Yin 04/17/2011, 1:28 PM

## 2011-04-17 NOTE — Progress Notes (Signed)
Pt is alert with a flat affect.  Pt is slow to respond.  Pt consumed 50% of meals.  Pt denies pain.  Pt requires a assist of one from wheelchair to bed.Pt requires set up for meals. Family at bedside,  Safety belt on while pt in wheelchair , pt has poor safety awareness Heather Sims

## 2011-04-17 NOTE — Progress Notes (Signed)
Physical Therapy Note  Patient Details  Name: Heather Sims MRN: [id number] Date of Birth: 07/01/1990 Today's Date: 04/17/2011  Time: 1115-1200 45 minutes  No c/o pain at start of session.  Pt c/o calves "hurting" when stretched, pain eased when out of position.  Treatment focused on NMR in quadruped, seated and standing with focus on core and hip control, grading movements, small wt shifts.  Pt requires manual facilitation at hips for stability during wt shifts.  Fatigues easily and requires frequent rests.  Pt requires increased time for motor planning and sequencing.  Min verbal and manual cues for motor control and planning.  Individual therapy   Alasdair Kleve 04/17/2011, 3:01 PM

## 2011-04-17 NOTE — Progress Notes (Addendum)
Occupational Therapy Session Note  Patient Details  Name: Heather Sims MRN: [id number] Date of Birth: 1991/01/09  Today's Date: 04/17/2011 Time: 800:-9:00 Time Calculation (min): 60 min  Precautions: Precautions Precautions: Fall Restrictions Weight Bearing Restrictions: No  Short Term Goals: OT Short Term Goal 1: Pt will tolerate standing at sink for 45 seconds to enable her to be more independent with LB adls. OT Short Term Goal 2: Pt will bathe with min assist. OT Short Term Goal 3: Pt will don underwear and pants with min assist. OT Short Term Goal 4: Pt will transfer to toilet with min assist. OT Short Term Goal 5: Pt will demonstrate improved static sitting balance of supervision to be able to bathe safely on tub bench.  Skilled Therapeutic Interventions/Progress Updates:    self care retraining at shower level. Pt was alert and making conversation when came in and was eating breakfast; however after transferring into w/c pt presented with even slower processing, decreased attention/ awareness, simple problem solving: " I dont know what to do" response to sit to stand and crossing LE. Pt with max A for mobility today. Focused on controlled movements with a flexed trunk/ core, following commands, sequencing etc.  Pain Still c/o in LEs (calfs) RN aware  Therapy/Group: Individual Therapy  [name] 04/17/2011, 12:08 PM

## 2011-04-17 NOTE — Progress Notes (Signed)
Speech Language Pathology Therapy Note  Patient Details  Name: Heather Sims MRN: [id number] Date of Birth: 26-Jan-1991  Today's Date: 04/17/2011 Time: 0935-1000 Time Calculation (min): 25 min  Precautions: Precautions Precautions: Fall Restrictions Weight Bearing Restrictions: No  Pt missed first 35 minutes of session secondary to downstairs for chest x-ray.   Skilled Therapeutic Interventions: Pt very lethargic with decreased speech intelligibility today (~50%) with Max verbal and demonstration cues to increase vocal intensity and over articulate at the phrase level. Max A verbal, question and visual cues needed to choose between two menu items. Pt able to recall earlier events of a shower and chest x-ray with Mod I.   Pain: No/Denies Pain  Therapy/Group: Individual Therapy  Heather Sims 04/17/2011 10:58 AM

## 2011-04-17 NOTE — Progress Notes (Signed)
Subjective: No complaints.  UOP recorded good.  No further fevers.  Feels like LE swelling decreased. Nothing new  Objective Vital signs in last 24 hours: Filed Vitals:   04/09/2011 0700 03/31/2011 1250 04/07/2011 1600 04/17/11 0546  BP: 100/60 152/85 134/84 126/65  Pulse: 82 80 72 91  Temp: 99.8 F (37.7 C) 97.7 F (36.5 C) 97.4 F (36.3 C) 99.4 F (37.4 C)  TempSrc: Oral Oral Oral Oral  Resp: 16 17 18 16   Weight:      SpO2: 100%  96% 97%   Weight change:   Intake/Output Summary (Last 24 hours) at 04/17/11 0854 Last data filed at 04/17/11 0500  Gross per 24 hour  Intake   1660 ml  Output   1300 ml  Net    360 ml   Labs: Basic Metabolic Panel:  Lab 04/17/11 6962 04/12/2011 0636 04/14/11 0650 04/13/11 2025 04/13/11 0500 04/12/11 0500 04/11/11 0500  NA 141 141 141 141 141 141 141  K 4.5 4.6 4.0 4.0 3.8 3.8 3.8  CL 107 107 105 104 106 106 107  CO2 23 24 25 26 25 25 24   GLUCOSE 96 97 95 108* 92 83 109*  BUN 67* 71* 65* 65* 72* 73* 77*  CREATININE 2.55* 2.55* 1.82* 1.94* 1.95* 1.99* 2.01*  ALB -- -- -- -- -- -- --  CALCIUM 8.2* 7.9* 8.1* 8.0* 7.9* 7.8* 8.0*  PHOS -- 4.6 4.6 -- 5.1* 4.4 4.8*   Liver Function Tests:  Lab 03/26/2011 0636 04/14/11 0650 04/13/11 2025  AST -- -- 26  ALT -- -- 12  ALKPHOS -- -- 50  BILITOT -- -- 0.1*  PROT -- -- 5.2*  ALBUMIN 1.5* 1.7* 1.7*   No results found for this basename: LIPASE:3,AMYLASE:3 in the last 168 hours No results found for this basename: AMMONIA:3 in the last 168 hours CBC:  Lab 04/17/11 0550 03/29/2011 0636 04/13/11 2025 04/13/11 0500  WBC 12.0* 11.6* 8.4 8.5  NEUTROABS 7.9* -- 5.7 --  HGB 8.1* 8.1* 9.9* 8.5*  HCT 24.7* 24.5* 29.2* 25.5*  MCV 90.1 89.1 89.0 89.2  PLT 324 313 352 319   PT/INR: @labrcntip (inr:5) Cardiac Enzymes: No results found for this basename: CKTOTAL:5,CKMB:5,CKMBINDEX:5,TROPONINI:5 in the last 168 hours CBG: No results found for this basename: GLUCAP:5 in the last 168 hours  Iron Studies: No results  found for this basename: IRON:30,TIBC:30,TRANSFERRIN:30,FERRITIN:30 in the last 168 hours Studies/Results: Dg Chest Port 1 View  04/15/2011  *RADIOLOGY REPORT*  Clinical Data: Shortness of breath.  PORTABLE CHEST - 1 VIEW  Comparison: Chest radiograph performed 04/07/2011  Findings: The lungs are well-aerated.  Vascular congestion is noted, with bilateral central airspace opacities, worse on the left.  This may reflect slightly worsening pneumonia or possibly asymmetric interstitial edema.  There is no evidence of pleural effusion or pneumothorax.  The cardiomediastinal silhouette remains enlarged, somewhat globular in appearance.  No acute osseous abnormalities are seen.  IMPRESSION: Vascular congestion and cardiomegaly, with bilateral central airspace opacities, worse on the left.  This may reflect slightly worsening pneumonia or possibly asymmetric interstitial edema.  Original Report Authenticated By: Tonia Ghent, M.D.   Medications:      . acetaminophen  650 mg Oral Once  . amLODipine  10 mg Oral Daily  . clonazePAM  0.5 mg Oral Daily  . cloNIDine  0.1 mg Transdermal Weekly  . darbepoetin (ARANESP) injection - NON-DIALYSIS  100 mcg Subcutaneous Q Thu-1800  . divalproex  1,000 mg Oral Q12H  . famotidine  20  mg Oral Daily  . feeding supplement  237 mL Oral BID  . ferrous sulfate  325 mg Oral TID WC  . furosemide  40 mg Oral BID  . hydrALAZINE  50 mg Oral QID  . megestrol  400 mg Oral Daily  . metoprolol tartrate  100 mg Oral BID  . moxifloxacin  400 mg Oral q1800  . senna  1 tablet Oral BID  . DISCONTD: ciprofloxacin  500 mg Oral BID  . DISCONTD: cloNIDine  0.3 mg Transdermal Weekly  . DISCONTD: metoprolol tartrate  100 mg Oral BID    I  have reviewed scheduled and prn medications.  Physical Exam:  Blood pressure 126/65, pulse 91, temperature 99.4 F (37.4 C), temperature source Oral, resp. rate 16, weight 149 lb 9.6 oz (67.858 kg), last menstrual period 03/02/2011, SpO2  97.00%.  Gen: alert,t, no distress, calm and pleasant Neck: no JVD, bruits or LAN Chest: CTAB Heart: regular, no rub or gallop Abdomen: soft, nontender Ext: Tr LE edema Neuro: alert, Ox3, no focal deficit   Problems: 1. AKI, proteinuria, low C3 - Awaiting results of renal biopsy.  Creat stable today, looks to be a plateau, recheck again in am. Continue to follow.  Initial kidney injury likely due to malig HTN, or glomerular disease. 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  I think most recent increase in creatinine is due to overcontrol of BP and mild ATN, pressures better today with decrease in clonidine.  Hopefully creatinine plateauing 2. Malignant HTN - better, on 4 BP meds  Volume excess resolved and making good amounts of urine. Continue lasix. BP looks good today. Aim for 130-150 SBP 3. S/P arrest with anoxic brain injury- per rehab  4. Anemia- on aranesp 5.  Peri-renal hematoma, s/p biopsy, seen at time of bx by [place] per IR -  Hb stable.  No hematuria, asymptomatic.  CBC with mild drop in Hb 9.9->8.1. Cont aranesp 6. Fever with CXR findings- Afebrile yesterday. A not that suggestive of UTI, ucx with no growth. Continue avelox to cover possible pna   Everrett Coombe, DO PGY-2  Patient seen and examined, agree with above note with above modifications.  Annie Sable, MD 04/17/2011

## 2011-04-17 NOTE — Progress Notes (Signed)
Patient ID: Kameria Canizares, female   DOB: 12/24/1990, 21 y.o.   MRN: [id number] Subjective/Complaints: Review of Systems  Constitutional: Positive for malaise/fatigue.  Neurological: Positive for tremors.  All other systems reviewed and are negative.  1/25----no new complaints. Slept well Objective: Vital Signs: Blood pressure 126/65, pulse 91, temperature 99.4 F (37.4 C), temperature source Oral, resp. rate 16, weight 67.858 kg (149 lb 9.6 oz), last menstrual period 03/02/2011, SpO2 97.00%. Dg Chest Port 1 View  04/09/2011  *RADIOLOGY REPORT*  Clinical Data: Shortness of breath.  PORTABLE CHEST - 1 VIEW  Comparison: Chest radiograph performed 04/07/2011  Findings: The lungs are well-aerated.  Vascular congestion is noted, with bilateral central airspace opacities, worse on the left.  This may reflect slightly worsening pneumonia or possibly asymmetric interstitial edema.  There is no evidence of pleural effusion or pneumothorax.  The cardiomediastinal silhouette remains enlarged, somewhat globular in appearance.  No acute osseous abnormalities are seen.  IMPRESSION: Vascular congestion and cardiomegaly, with bilateral central airspace opacities, worse on the left.  This may reflect slightly worsening pneumonia or possibly asymmetric interstitial edema.  Original Report Authenticated By: Tonia Ghent, M.D.    Basename 04/17/11 0550 03/29/2011 0636  WBC 12.0* 11.6*  HGB 8.1* 8.1*  HCT 24.7* 24.5*  PLT 324 313    Basename 04/17/11 0550 04/19/2011 0636  NA 141 141  K 4.5 4.6  CL 107 107  CO2 23 24  GLUCOSE 96 97  BUN 67* 71*  CREATININE 2.55* 2.55*  CALCIUM 8.2* 7.9*   CBG (last 3)  No results found for this basename: GLUCAP:3 in the last 72 hours  Wt Readings from Last 3 Encounters:  04/15/11 67.858 kg (149 lb 9.6 oz)  04/10/11 69.536 kg (153 lb 4.8 oz)    Physical Exam:  General appearance: no distress and slowed mentation Head: Normocephalic, without obvious abnormality,  atraumatic Eyes: conjunctivae/corneas clear. PERRL, EOM's intact. Fundi benign. Ears: normal TM's and external ear canals both ears Nose: Nares normal. Septum midline. Mucosa normal. No drainage or sinus tenderness. Throat: lips, mucosa, and tongue normal; teeth and gums normal Neck: no adenopathy, no carotid bruit, no JVD, supple, symmetrical, trachea midline and thyroid not enlarged, symmetric, no tenderness/mass/nodules Back: symmetric, no curvature. ROM normal. No CVA tenderness. Resp: clear to auscultation bilaterally Cardio: regular rate and rhythm, S1, S2 normal, no murmur, click, rub or gallop and normal apical impulse GI: soft, non-tender; bowel sounds normal; no masses,  no organomegaly Extremities: LLE with 1+ edema.  Trace to 1 on the RLE Pulses: 2+ and symmetric Skin: Skin color, texture, turgor normal. No rashes or lesions Neurologic: delayed processing and word finding deficits still.   Oriented to name and hospital. Speech slurred and low volume.  UE's grossly 2+ to 3+/5.  LE's 2/5 prox to 3/5 distally.  Withdraws to pain on all 4's. DTR's 2-3+.  Follows all simple 1 step commands. Incision/Wound: n/a  exam updated 1/25  Assessment/Plan: 1. Functional deficits secondary to anoxic brain injury which require 3+ hours per day of interdisciplinary therapy in a comprehensive inpatient rehab setting. Physiatrist is providing close team supervision and 24 hour management of active medical problems listed below. Physiatrist and rehab team continue to assess barriers to discharge/monitor patient progress toward functional and medical goals. Mobility: Bed Mobility Supine to Sit: 3: Mod assist Sitting - Scoot to Edge of Bed: 3: Mod assist Transfers Sit to Stand: 3: Mod assist Sit to Stand Details (indicate cue type and reason): pt can only  tolerate standing for 20 seconds or less     ADL:   Cognition: Cognition Overall Cognitive Status: Impaired Arousal/Alertness:  Lethargic Orientation Level: Oriented X4 Attention: Sustained Focused Attention: Appears intact Sustained Attention: Impaired Sustained Attention Impairment: Functional basic;Verbal basic Memory: Impaired Memory Impairment: Decreased short term memory;Decreased recall of new information Decreased Short Term Memory: Verbal basic;Functional basic Awareness: Impaired Awareness Impairment: Emergent impairment Problem Solving: Impaired Problem Solving Impairment: Functional basic Executive Function: Self Monitoring;Self Correcting;Organizing Reasoning: Impaired Reasoning Impairment: Verbal basic;Functional basic Organizing: Impaired Organizing Impairment: Verbal basic;Functional basic Self Monitoring: Impaired Self Monitoring Impairment: Verbal basic Self Correcting: Impaired Self Correcting Impairment: Verbal basic Safety/Judgment: Impaired Cognition Arousal/Alertness: Lethargic Orientation Level: Oriented X4  1 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  2. DVT Prophylaxis/Anticoagulation: SCDs.dopplers negative. 3. Seizure disorder. Valproate 1000 mg every 12 hours. Latest valproic acid level January 15 of 42.6. Monitor closely for any increased seizure activity. 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  Check VPA level tomorrow. 4. Hypertension.  Norvasc increased to 10mg . clonidine patch 0.3 mg change weekly, hydralazine 25 mg 4 times daily, Lopressor 100 mg twice daily. Monitor closely with increased activity. BP's showing improvement.  5 Renal failure. 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 Await renal biopsy completed January 18.. Strict I and O.'s for now. 6. Nutrition: Continue to encourage intake. Consider appetite stimulant. Valproic acid should help with appetite as well.  -megace seems to have helped 7. Cardiomyopathy--?hypertensive vs viral  -follow up per cards 8. Temp- (101.8)  Empiric cipro started.  ucx negatvie  -cxr with edema vs infiltrate.  Doesn't look a lot different to me from imaging on 1/15 --avelox  -bcx collected and negative.   -dopplers negative  -cbc slightly increasd this am.  -recheck cxr today   LOS (Days) 4 A FACE TO FACE EVALUATION WAS PERFORMED  SWARTZ,ZACHARY T 04/17/2011, 7:41 AM

## 2011-04-18 LAB — RENAL FUNCTION PANEL
CO2: 24 mEq/L (ref 19–32)
Chloride: 106 mEq/L (ref 96–112)
GFR calc Af Amer: 29 mL/min — ABNORMAL LOW (ref >90)
GFR calc non Af Amer: 25 mL/min — ABNORMAL LOW (ref >90)
Glucose, Bld: 94 mg/dL (ref 70–99)
Potassium: 4.4 mEq/L (ref 3.5–5.1)
Sodium: 140 mEq/L (ref 135–145)

## 2011-04-18 MED ORDER — POTASSIUM CHLORIDE CRYS ER 20 MEQ PO TBCR
20.0000 meq | EXTENDED_RELEASE_TABLET | Freq: Every day | ORAL | Status: DC
  Administered 2011-04-18 – 2011-04-19 (×2): 20 meq via ORAL
  Filled 2011-04-18 (×3): qty 1

## 2011-04-18 MED ORDER — FUROSEMIDE 40 MG PO TABS
40.0000 mg | ORAL_TABLET | Freq: Three times a day (TID) | ORAL | Status: DC
  Administered 2011-04-18 – 2011-04-20 (×6): 40 mg via ORAL
  Filled 2011-04-18 (×9): qty 1

## 2011-04-18 NOTE — Progress Notes (Signed)
Speech Language Pathology Therapy Note  Patient Details  Name: Tammi Boulier MRN: [id number] Date of Birth: 09-Sep-1990  Today's Date: 04/18/2011 Time: 0805-0905 Time Calculation (min): 60 min  Precautions: Precautions Precautions: Fall Restrictions Weight Bearing Restrictions: No  Skilled Therapeutic Interventions: Treatment focus on sustained attention and problem solving. Pt appears more awake and alert today with increased intelligibility at the word level. Pt able to recall earlier therapy events with supervision question cues. Pt able to sort colors with Mod visual and verbal cues and sort shapes from field of 3 with Max verbal and visual cues. Pt with left inattention to tasks with Max A to scan to locate color/shape. Sustained attention to task for ~30-60 secs with Max verbal cues to redirect.   PainPain Assessment Pain Assessment: No/denies pain Pain Score: 0-No pain  Therapy/Group: Individual Therapy  Yeraldine Forney 04/18/2011 12:46 PM

## 2011-04-18 NOTE — Progress Notes (Signed)
I have seen and examined this patient and agree with the plan of care . Renal function is stable and will follow.  Sybil Shrader W 04/18/2011, 10:43 AM

## 2011-04-18 NOTE — Progress Notes (Signed)
Patient ID: Heather Sims, female   DOB: [birth year], 21 y.o.   MRN: [id number] Patient ID: Heather Sims, female   DOB: 04/18/1990, 21 y.o.   MRN: [id number] Subjective/Complaints: Review of Systems  Constitutional: Positive for malaise/fatigue.  Neurological: Positive for tremors.  All other systems reviewed and are negative.  1/26-- feet more swollen.  No shortness of breath. Occasional cough  Objective Vital Signs: Blood pressure 146/86, pulse 87, temperature 98.6 F (37 C), temperature source Oral, resp. rate 18, weight 67.858 kg (149 lb 9.6 oz), last menstrual period 03/24/2011, SpO2 94.00%. Dg Chest 2 View  04/17/2011  *RADIOLOGY REPORT*  Clinical Data: Shortness of breath, fever, pneumonia.  CHEST - 2 VIEW  Comparison: 04/06/2011  Findings: There is cardiomegaly.  Vascular congestion and bilateral opacities, likely mild edema.  There are low lung volumes.  No effusions.  No acute bony abnormality.  IMPRESSION: Cardiomegaly with mild edema/CHF.  Original Report Authenticated By: Cyndie Chime, M.D.    Basename 04/17/11 0550 04/14/2011 0636  WBC 12.0* 11.6*  HGB 8.1* 8.1*  HCT 24.7* 24.5*  PLT 324 313    Basename 04/18/11 0500 04/17/11 0550  NA 140 141  K 4.4 4.5  CL 106 107  CO2 24 23  GLUCOSE 94 96  BUN 62* 67*  CREATININE 2.61* 2.55*  CALCIUM 8.2* 8.2*   CBG (last 3)  No results found for this basename: GLUCAP:3 in the last 72 hours  Wt Readings from Last 3 Encounters:  04/15/11 67.858 kg (149 lb 9.6 oz)  04/10/11 69.536 kg (153 lb 4.8 oz)    Physical Exam:  General appearance: no distress and slowed mentation Head: Normocephalic, without obvious abnormality, atraumatic Eyes: conjunctivae/corneas clear. PERRL, EOM's intact. Fundi benign. Ears: normal TM's and external ear canals both ears Nose: Nares normal. Septum midline. Mucosa normal. No drainage or sinus tenderness. Throat: lips, mucosa, and tongue normal; teeth and gums normal Neck: no adenopathy, no  carotid bruit, no JVD, supple, symmetrical, trachea midline and thyroid not enlarged, symmetric, no tenderness/mass/nodules Back: symmetric, no curvature. ROM normal. No CVA tenderness. Resp: clear to auscultation bilaterally Cardio: regular rate and rhythm, S1, S2 normal, no murmur, click, rub or gallop and normal apical impulse GI: soft, non-tender; bowel sounds normal; no masses,  no organomegaly Extremities:BLE 1+ edema  Pulses: 2+ and symmetric Skin: Skin color, texture, turgor normal. No rashes or lesions Neurologic: delayed processing and word finding deficits still.   Oriented to name and hospital. Speech slurred and low volume.  UE's grossly 2+ to 3+/5.  LE's 2/5 prox to 3/5 distally.  Withdraws to pain on all 4's. DTR's 2-3+.  Follows all simple 1 step commands. Incision/Wound: n/a  exam updated 1/26  Assessment/Plan: 1. Functional deficits secondary to anoxic brain injury which require 3+ hours per day of interdisciplinary therapy in a comprehensive inpatient rehab setting. Physiatrist is providing close team supervision and 24 hour management of active medical problems listed below. Physiatrist and rehab team continue to assess barriers to discharge/monitor patient progress toward functional and medical goals. Mobility: Bed Mobility Supine to Sit: 3: Mod assist Sitting - Scoot to Edge of Bed: 3: Mod assist Transfers Sit to Stand: 3: Mod assist Sit to Stand Details (indicate cue type and reason): pt can only tolerate standing for 20 seconds or less     ADL:   Cognition: Cognition Overall Cognitive Status: Impaired Arousal/Alertness: Lethargic Orientation Level: Oriented X4 Attention: Sustained Focused Attention: Appears intact Sustained Attention: Impaired Sustained Attention Impairment: Functional basic;Verbal  basic Memory: Impaired Memory Impairment: Decreased short term memory;Decreased recall of new information Decreased Short Term Memory: Verbal basic;Functional  basic Awareness: Impaired Awareness Impairment: Emergent impairment Problem Solving: Impaired Problem Solving Impairment: Functional basic Executive Function: Self Monitoring;Self Correcting;Organizing Reasoning: Impaired Reasoning Impairment: Verbal basic;Functional basic Organizing: Impaired Organizing Impairment: Verbal basic;Functional basic Self Monitoring: Impaired Self Monitoring Impairment: Verbal basic Self Correcting: Impaired Self Correcting Impairment: Verbal basic Safety/Judgment: Impaired Cognition Arousal/Alertness: Lethargic Orientation Level: Oriented X4  1 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  2. DVT Prophylaxis/Anticoagulation: SCDs.dopplers negative. 3. Seizure disorder. Valproate 1000 mg every 12 hours. Latest valproic acid level January 15 of 42.6. Monitor closely for any increased seizure activity. 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  Check VPA level tomorrow. 4. Hypertension.  Norvasc increased to 10mg . clonidine patch 0.3 mg change weekly, hydralazine 25 mg 4 times daily, Lopressor 100 mg twice daily. Monitor closely with increased activity. BP's showing improvement.  5 Renal failure. 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 Await renal biopsy completed January 18.. Strict I and O.'s for now. 6. Nutrition: Continue to encourage intake. Consider appetite stimulant. Valproic acid should help with appetite as well.  -megace seems to have helped 7. Cardiomyopathy--?hypertensive vs viral  -follow up per cards  -increase lasix.  Elevate legs  -recheck cxr next week 8. Temp-   -avelox for ?pneumonia  -bcx collected and negative.   -dopplers negative  -recheck cxr stable (mild chf)   LOS (Days) 5 A FACE TO FACE EVALUATION WAS PERFORMED  SWARTZ,ZACHARY T 04/18/2011, 8:13 AM

## 2011-04-18 NOTE — Progress Notes (Signed)
Occupational Therapy Session Note  Patient Details  Name: Heather Sims MRN: [id number] Date of Birth: 10-04-1990   Session 1: Today's Date: 04/18/2011 Time: 0805-0905 Time Calculation (min): 60 min  Session 2: Time: 1305 - 1350 Time Calculation (min): 45 minutes  Precautions: Precautions Precautions: Fall Restrictions Weight Bearing Restrictions: No  Short Term Goals: OT Short Term Goal 1: Pt will tolerate standing at sink for 45 seconds to enable her to be more independent with LB adls. OT Short Term Goal 2: Pt will bathe with min assist. OT Short Term Goal 3: Pt will don underwear and pants with min assist. OT Short Term Goal 4: Pt will transfer to toilet with min assist. OT Short Term Goal 5: Pt will demonstrate improved static sitting balance of supervision to be able to bathe safely on tub bench.  Skilled Therapeutic Interventions/Progress Updates:    Session 1:  Pt seen for ADL retraining of bathing and dressing at sink level with focus on sit to stand and initiation.  Pt expressed that she felt very cold and needed encouragement to engage in bathing.  Pt was more alert this am and spoke with increased clarity.  Pt demonstrated increased initiation with bathing and UB dressing. Pt stood at sink 4x for 15-20 seconds for LB bathing and dressing.  Pt had increased swelling in LEs, shoes would not fit on feet.  Knee high TED hose applied. During session, pt became very tearful and stated that she felt frustrated.  Session 2: Pt seen for therapeutic activities to increase focus, response time, speed of movement, BUE coordination and strength and sit to stand skills.  Pt used weighted dowel with mod assist to follow through on simple shoulder ROM, mod assist with arm bicycle to maintain grasp and maintain movement.  Tossing and catching lightweight ball with mod verbal cues as to when to open and close hands.  Activities to facilitate bilateral grasp.  Patient stood at table to play  checkers 6x for 15-20 sec each time.  Pt was able to attend to game well, but had difficulty with Right pinch to grasp game pieces.  Mod verbal cues and assist from stand to sit to reach back to chair.       Pain Pain Assessment Pain Assessment: No/denies pain Pain Score: 0-No pain       Therapy/Group: Individual Therapy  Jhace Fennell 04/18/2011, 12:49 PM

## 2011-04-18 NOTE — Progress Notes (Signed)
Physical Therapy Session Note  Patient Details  Name: Heather Sims MRN: [id number] Date of Birth: [birth year]  Today's Date: 04/18/2011 Time: 1404-1500 Time Calculation (min): 56 min  Precautions: Precautions Precautions: Fall Restrictions Weight Bearing Restrictions: No  Pain Pain Assessment Pain Assessment: No/denies pain but grimaced with stretching LEs  NMR:  Sitting balance: with multilevel reaching, no back support, supervision level. Stability training with UEs on therapy ball rolling it FW/BW.            Stability training and extremity weightbearing in quadruped with therapy ball for support with reaching activities, Min A. Strengthening/ stretching:  P/ROM stretching for ankle PFs, Hip IRs and hamstrings.  A/ROM stretching for trunk and pelvis in "circle sit " postion; Bridging10x Functional activities with cues for technique: Supine<>sit supervsion, prone<> quadruped MinA, Squat-pivot transfers MinA.  Therapy/Group: Individual Therapy  Hortencia Conradi, PTA 04/18/2011, 4:39 PM

## 2011-04-18 NOTE — Progress Notes (Signed)
Subjective: No complaints.  UOP not recorded overnight.  No further fevers.  Feels like LE swelling about the same as yesterday  Objective Vital signs in last 24 hours: Filed Vitals:   04/17/11 0945 04/17/11 1532 04/17/11 1744 04/18/11 0603  BP: 132/77 131/81 110/60 146/86  Pulse: 78 78 80 87  Temp:  98.6 F (37 C)  98.6 F (37 C)  TempSrc:  Oral  Oral  Resp: 16 18  18   Weight:      SpO2: 97% 99%  94%   Weight change:   Intake/Output Summary (Last 24 hours) at 04/18/11 0836 Last data filed at 04/17/11 1811  Gross per 24 hour  Intake    600 ml  Output      0 ml  Net    600 ml   Labs: Basic Metabolic Panel:  Lab 04/18/11 1610 04/17/11 0550 04/11/2011 0636 04/14/11 0650 04/13/11 2025 04/13/11 0500 04/12/11 0500  NA 140 141 141 141 141 141 141  K 4.4 4.5 4.6 4.0 4.0 3.8 3.8  CL 106 107 107 105 104 106 106  CO2 24 23 24 25 26 25 25   GLUCOSE 94 96 97 95 108* 92 83  BUN 62* 67* 71* 65* 65* 72* 73*  CREATININE 2.61* 2.55* 2.55* 1.82* 1.94* 1.95* 1.99*  ALB -- -- -- -- -- -- --  CALCIUM 8.2* 8.2* 7.9* 8.1* 8.0* 7.9* 7.8*  PHOS 5.6* -- 4.6 4.6 -- 5.1* 4.4   Liver Function Tests:  Lab 04/18/11 0500 04/23/2011 0636 04/14/11 0650 04/13/11 2025  AST -- -- -- 26  ALT -- -- -- 12  ALKPHOS -- -- -- 50  BILITOT -- -- -- 0.1*  PROT -- -- -- 5.2*  ALBUMIN 1.6* 1.5* 1.7* --   No results found for this basename: LIPASE:3,AMYLASE:3 in the last 168 hours No results found for this basename: AMMONIA:3 in the last 168 hours CBC:  Lab 04/17/11 0550 04/20/2011 0636 04/13/11 2025 04/13/11 0500  WBC 12.0* 11.6* 8.4 8.5  NEUTROABS 7.9* -- 5.7 --  HGB 8.1* 8.1* 9.9* 8.5*  HCT 24.7* 24.5* 29.2* 25.5*  MCV 90.1 89.1 89.0 89.2  PLT 324 313 352 319   PT/INR: @labrcntip (inr:5) Cardiac Enzymes: No results found for this basename: CKTOTAL:5,CKMB:5,CKMBINDEX:5,TROPONINI:5 in the last 168 hours CBG: No results found for this basename: GLUCAP:5 in the last 168 hours  Iron Studies: No results  found for this basename: IRON:30,TIBC:30,TRANSFERRIN:30,FERRITIN:30 in the last 168 hours Studies/Results: Dg Chest 2 View  04/17/2011  *RADIOLOGY REPORT*  Clinical Data: Shortness of breath, fever, pneumonia.  CHEST - 2 VIEW  Comparison: 04/11/2011  Findings: There is cardiomegaly.  Vascular congestion and bilateral opacities, likely mild edema.  There are low lung volumes.  No effusions.  No acute bony abnormality.  IMPRESSION: Cardiomegaly with mild edema/CHF.  Original Report Authenticated By: Cyndie Chime, M.D.   Medications:      . acetaminophen  650 mg Oral Once  . amLODipine  10 mg Oral Daily  . clonazePAM  0.5 mg Oral Daily  . cloNIDine  0.1 mg Transdermal Weekly  . darbepoetin (ARANESP) injection - NON-DIALYSIS  100 mcg Subcutaneous Q Thu-1800  . divalproex  1,000 mg Oral Q12H  . famotidine  20 mg Oral Daily  . feeding supplement  237 mL Oral BID  . ferrous sulfate  325 mg Oral TID WC  . furosemide  40 mg Oral BID  . hydrALAZINE  50 mg Oral QID  . megestrol  400 mg Oral  Daily  . metoprolol tartrate  100 mg Oral BID  . moxifloxacin  400 mg Oral q1800  . senna  1 tablet Oral BID    I  have reviewed scheduled and prn medications.  Physical Exam:  Blood pressure 146/86, pulse 87, temperature 98.6 F (37 C), temperature source Oral, resp. rate 18, weight 149 lb 9.6 oz (67.858 kg), last menstrual period 03/24/2011, SpO2 94.00%.  Gen: alert,t, no distress, calm and pleasant Neck: no JVD, bruits or LAN Chest: CTAB Heart: regular, no rub or gallop Abdomen: soft, nontender Ext: Tr LE edema Neuro: alert, Ox3, no focal deficit   Problems: 1. AKI, proteinuria, low C3 - Awaiting final results of renal biopsy.  Creat up slightly, hopefully plateauing, recheck again in am. Continue to follow.  Initial kidney injury likely due to malig HTN, or glomerular disease. 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  I think most recent increase in creatinine  is due to overcontrol of BP and mild ATN, pressures in better range with decrease in clonidine.   2. Malignant HTN - better, on 4 BP meds  Volume excess resolved and making good amounts of urine. Continue lasix. BP looks good today. Aim for 130-150 SBP 3. S/P arrest with anoxic brain injury- per rehab  4. Anemia- on aranesp 5.  Peri-renal hematoma, s/p biopsy, seen at time of bx by [place] per IR.Marland Kitchen  No hematuria, asymptomatic.  CBC with mild drop in Hb 9.9->8.1 on 1/25. Cont aranesp 6. Fever with CXR findings- Afebrile yesterday. A not that suggestive of UTI, ucx with no growth. Continue avelox to cover possible pna   Everrett Coombe, DO PGY-2

## 2011-04-19 LAB — RENAL FUNCTION PANEL
Albumin: 1.6 g/dL — ABNORMAL LOW (ref 3.5–5.2)
CO2: 24 mEq/L (ref 19–32)
Chloride: 106 mEq/L (ref 96–112)
GFR calc Af Amer: 29 mL/min — ABNORMAL LOW (ref >90)
GFR calc non Af Amer: 25 mL/min — ABNORMAL LOW (ref >90)
Sodium: 141 mEq/L (ref 135–145)

## 2011-04-19 NOTE — Progress Notes (Signed)
Physical Therapy Note  Patient Details  Name: Heather Sims MRN: [id number] Date of Birth: [birth year] Today's Date: 04/19/2011  1345-1420 (40 minutes) individual treatment session Pain- see below Focus of treatment : closed chain activities to decrease tremors bilateral extremities Treatment: Transfers: mod assist squat/pivot ; Nustep X 5 minutes Level 1 for closed chain bilateral activity; standing to standing frame x 2 with initial c/o of bilateral LE (calf) pain; sit to stand to EVA walker X 2 min assist for approximately 1 minute each/ no c/o of LE pain reported.   [name],JIM 04/19/2011, 2:06 PM

## 2011-04-19 NOTE — Progress Notes (Signed)
Subjective: Interval History: none.  Objective: Vital signs in last 24 hours:  Temp:  [98.5 F (36.9 C)-98.9 F (37.2 C)] 98.9 F (37.2 C) (01/27 0539) Pulse Rate:  [82-90] 90  (01/27 0539) Resp:  [18-20] 20  (01/27 0539) BP: (132-143)/(81-101) 143/101 mmHg (01/27 0539) SpO2:  [96 %-100 %] 96 % (01/27 0539)  Weight change:   Intake/Output: I/O last 3 completed shifts: In: 360 [P.O.:360] Out: 375 [Urine:375]   Intake/Output this shift:     CVS- RRR RS- CTA ABD- BS present soft non-distended EXT- no edema  Lab Results:  [facility] 04/17/11 0550  WBC 12.0*  HGB 8.1*  HCT 24.7*  PLT 324   BMET  Basename 04/19/11 0500 04/18/11 0500 04/17/11 0550  NA 141 140 141  K 5.1 4.4 4.5  CL 106 106 107  CO2 24 24 23   GLUCOSE 95 94 96  BUN 61* 62* 67*  CREATININE 2.62* 2.61* 2.55*  CALCIUM 8.1* 8.2* 8.2*  PHOS 5.6* 5.6* --   LFT  Basename 04/19/11 0500  PROT --  ALBUMIN 1.6*  AST --  ALT --  ALKPHOS --  BILITOT --  BILIDIR --  IBILI --   PT/INR No results found for this basename: LABPROT:2,INR:2 in the last 72 hours Hepatitis Panel No results found for this basename: HEPBSAG,HCVAB,HEPAIGM,HEPBIGM in the last 72 hours  Studies/Results: Dg Chest 2 View  04/17/2011  *RADIOLOGY REPORT*  Clinical Data: Shortness of breath, fever, pneumonia.  CHEST - 2 VIEW  Comparison: 2011/04/20  Findings: There is cardiomegaly.  Vascular congestion and bilateral opacities, likely mild edema.  There are low lung volumes.  No effusions.  No acute bony abnormality.  IMPRESSION: Cardiomegaly with mild edema/CHF.  Original Report Authenticated By: Cyndie Chime, M.D.    I have reviewed the patient's current medications.  Assessment/Plan: 1. AKI, proteinuria, low C3 - Awaiting final results of renal biopsy. Creat up slightly, hopefully plateauing, recheck again in am. Continue to follow. Initial kidney injury likely due to malig HTN, or glomerular disease. 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 I think most recent increase in creatinine is due to overcontrol of BP and mild ATN, pressures in better range with decrease in clonidine.  2. Malignant HTN - better, on 4 BP meds Volume excess resolved and making good amounts of urine. Continue lasix. BP looks good today. Aim for 130-150 SBP  3. S/P arrest with anoxic brain injury- per rehab  4. Anemia- on aranesp  5. Peri-renal hematoma, s/p biopsy, seen at time of bx by [place] per IR.Marland Kitchen No hematuria, asymptomatic. CBC with mild drop in Hb 9.9->8.1 on 1/25. Cont aranesp  6. Fever with CXR findings- Afebrile yesterday. A not that suggestive of UTI, ucx with no growth. Continue avelox to cover possible pna  Creatinine has stabilized will follow   LOS: 6 Tiffanny Lamarche W @TODAY @8 :14 AM

## 2011-04-19 NOTE — Progress Notes (Signed)
Patient ID: Heather Sims, female   DOB: 1991/01/30, 21 y.o.   MRN: [id number] Patient ID: Heather Sims, female   DOB: 06/20/1990, 21 y.o.   MRN: [id number] Patient ID: Heather Sims, female   DOB: Sep 11, 1990, 21 y.o.   MRN: [id number] Subjective/Complaints: Review of Systems  Constitutional: Positive for malaise/fatigue.  Neurological: Positive for tremors.  All other systems reviewed and are negative.  1/27--slept well, denies cough  Objective Vital Signs: Blood pressure 143/101, pulse 90, temperature 98.9 F (37.2 C), temperature source Oral, resp. rate 20, weight 67.858 kg (149 lb 9.6 oz), last menstrual period 03/24/2011, SpO2 96.00%. Dg Chest 2 View  04/17/2011  *RADIOLOGY REPORT*  Clinical Data: Shortness of breath, fever, pneumonia.  CHEST - 2 VIEW  Comparison: 03/24/2011  Findings: There is cardiomegaly.  Vascular congestion and bilateral opacities, likely mild edema.  There are low lung volumes.  No effusions.  No acute bony abnormality.  IMPRESSION: Cardiomegaly with mild edema/CHF.  Original Report Authenticated By: Cyndie Chime, M.D.    Basename 04/17/11 0550  WBC 12.0*  HGB 8.1*  HCT 24.7*  PLT 324    Basename 04/18/11 0500 04/17/11 0550  NA 140 141  K 4.4 4.5  CL 106 107  CO2 24 23  GLUCOSE 94 96  BUN 62* 67*  CREATININE 2.61* 2.55*  CALCIUM 8.2* 8.2*   CBG (last 3)  No results found for this basename: GLUCAP:3 in the last 72 hours  Wt Readings from Last 3 Encounters:  04/15/11 67.858 kg (149 lb 9.6 oz)  04/10/11 69.536 kg (153 lb 4.8 oz)    Physical Exam:  General appearance: no distress and slowed mentation Head: Normocephalic, without obvious abnormality, atraumatic Eyes: conjunctivae/corneas clear. PERRL, EOM's intact. Fundi benign. Ears: normal TM's and external ear canals both ears Nose: Nares normal. Septum midline. Mucosa normal. No drainage or sinus tenderness. Throat: lips, mucosa, and tongue normal; teeth and gums normal Neck: no  adenopathy, no carotid bruit, no JVD, supple, symmetrical, trachea midline and thyroid not enlarged, symmetric, no tenderness/mass/nodules Back: symmetric, no curvature. ROM normal. No CVA tenderness. Resp: clear to auscultation bilaterally Cardio: regular rate and rhythm, S1, S2 normal, no murmur, click, rub or gallop and normal apical impulse GI: soft, non-tender; bowel sounds normal; no masses,  no organomegaly Extremities:BLE 1+ edema still Pulses: 2+ and symmetric Skin: Skin color, texture, turgor normal. No rashes or lesions Neurologic: delayed processing and word finding deficits still.   Oriented to name and hospital. Speech slurred and low volume.  UE's grossly 2+ to 3+/5.  LE's 2/5 prox to 3/5 distally.  Withdraws to pain on all 4's. DTR's 2-3+.  Follows all simple 1 step commands. Incision/Wound: n/a  exam updated 1/27  Assessment/Plan: 1. Functional deficits secondary to anoxic brain injury which require 3+ hours per day of interdisciplinary therapy in a comprehensive inpatient rehab setting. Physiatrist is providing close team supervision and 24 hour management of active medical problems listed below. Physiatrist and rehab team continue to assess barriers to discharge/monitor patient progress toward functional and medical goals. Mobility: Bed Mobility Supine to Sit: 3: Mod assist Sitting - Scoot to Edge of Bed: 3: Mod assist Transfers Sit to Stand: 3: Mod assist Sit to Stand Details (indicate cue type and reason): pt can only tolerate standing for 20 seconds or less Ambulation/Gait Ambulation/Gait Assistance: Not tested (comment)   ADL:   Cognition: Cognition Overall Cognitive Status: Impaired Arousal/Alertness: Lethargic Orientation Level: Oriented X4 Attention: Sustained Focused Attention: Appears intact Sustained Attention:  Impaired Sustained Attention Impairment: Functional basic;Verbal basic Memory: Impaired Memory Impairment: Decreased short term  memory;Decreased recall of new information Decreased Short Term Memory: Verbal basic;Functional basic Awareness: Impaired Awareness Impairment: Emergent impairment Problem Solving: Impaired Problem Solving Impairment: Functional basic Executive Function: Self Monitoring;Self Correcting;Organizing Reasoning: Impaired Reasoning Impairment: Verbal basic;Functional basic Organizing: Impaired Organizing Impairment: Verbal basic;Functional basic Self Monitoring: Impaired Self Monitoring Impairment: Verbal basic Self Correcting: Impaired Self Correcting Impairment: Verbal basic Safety/Judgment: Impaired Cognition Arousal/Alertness: Lethargic Orientation Level: Oriented X4  1 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  2. DVT Prophylaxis/Anticoagulation: SCDs.dopplers negative. 3. Seizure disorder. Valproate 1000 mg every 12 hours. Latest valproic acid level January 15 of 42.6. Monitor closely for any increased seizure activity. 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   4. Hypertension.  Norvasc increased to 10mg . clonidine patch 0.3 mg change weekly, hydralazine 25 mg 4 times daily, Lopressor 100 mg twice daily. Monitor closely with increased activity. BP's showing improvement despite this AM's reading. 5 Renal failure. 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 Await renal biopsy completed January 18.. Strict I and O.'s and f/u per nephrology. 6. Nutrition: Continue to encourage intake. . Valproic acid should help with appetite as well.  -megace helping. 7. Cardiomyopathy--?hypertensive vs viral  -follow up per cards  -increased lasix.  Elevate legs  -recheck cxr next week  -denies and SOB at present and is tolerating activities with therapy 8. Temp-   -avelox for ?pneumonia  -bcx collected and negative.   -dopplers negative  -recheck cxr stable (mild chf)   LOS (Days) 6 A FACE TO FACE EVALUATION WAS  PERFORMED  Heather Sims T 04/19/2011, 7:28 AM

## 2011-04-20 MED ORDER — FUROSEMIDE 80 MG PO TABS
80.0000 mg | ORAL_TABLET | Freq: Three times a day (TID) | ORAL | Status: DC
  Administered 2011-04-20 – 2011-04-26 (×19): 80 mg via ORAL
  Filled 2011-04-20 (×22): qty 1

## 2011-04-20 NOTE — Progress Notes (Signed)
Occupational Therapy Session Note  Patient Details  Name: Heather Sims MRN: [id number] Date of Birth: 11-Apr-1990  Today's Date: 04/20/2011 Time: 0800-0900 Time Calculation (min): 60 min  Precautions: Precautions Precautions: Fall Restrictions Weight Bearing Restrictions: No  Short Term Goals: OT Short Term Goal 1: Pt will tolerate standing at sink for 45 seconds to enable her to be more independent with LB adls. OT Short Term Goal 2: Pt will bathe with min assist. OT Short Term Goal 3: Pt will don underwear and pants with min assist. OT Short Term Goal 4: Pt will transfer to toilet with min assist. OT Short Term Goal 5: Pt will demonstrate improved static sitting balance of supervision to be able to bathe safely on tub bench.  Skilled Therapeutic Interventions/Progress Updates:    1st session: ADL retraining at sink level. Pt expressed frustration about her situation and "how hard things are." educated and encouraged her on our goals and our progress with functional tasks. Focus on sitting balance (dynamically), sit to stand, task organization/ sequencing(with mod cuing), standing tolerance, controlled movement, activity tolerance, energy conservation.   2nd session: 11:15-11:45 neuromuscular reeducation (co tx with PT the first part of session). Focus on sit to stand, static and dynamic standing balance with left hip activation/ control, functional ambulation with three musketeers method, weight shifts, trunk/ core control in upright stance, activity tolerance, and standing tolerance. Pt with c/o of foot discomfort (feet are still very swollen).      Pain  pain in feet and left calf- RN aware  Therapy/Group: Individual Therapy  [name] 04/20/2011, 12:01 PM

## 2011-04-20 NOTE — Progress Notes (Addendum)
I have seen and examined this patient and agree with the plan of care , significant edema improved. Will plan diuretics. 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 I think she has significant sclerosis on biopsy a stable creatinine and <1 g proteinuria makes it less compelling.  Daleyssa Loiselle W 04/20/2011, 10:38 AM

## 2011-04-20 NOTE — Progress Notes (Signed)
Patient requires assist to set up meals and requires verbal cues to complete meal patient falls asleep at times. Bilateral lower extremities and feet edematous pitting edema noted . Bilateral lower extremities elevated on pillows in bed . Patient continent calls for assist to bathroom . Mod assist stand max assist to pivot . Patient unable to perform hygiene needs . Patient 's mother at bedside . Continue with plan of care.         Heather Sims

## 2011-04-20 NOTE — Progress Notes (Signed)
Physical Therapy Note  Patient Details  Name: Katrese Shell MRN: [id number] Date of Birth: 1990/08/17 Today's Date: 04/20/2011  Time: 1000-1115 75 minutes  Pt c/o L LE pain with standing, eased with rest, RN aware.  Treatment session focused on NMR for trunk/hip control and stability.  Quadruped with manual facilitation at hips for wt shifts all direcitons, alternating UE lifts, LE lifts.  Pt with L hip weakness greater than R requirng max A to stabilize on that side.  Standing in EVA walker multiple attempts with wt shifts, manual faciliation on L side to increase wt bearing on left.  Cues for upright posture and hip stability in stance.  Scooting laterally edge of mat with cues for forward wt shifts.  Improved with repetition.  Nu step for closed chair UE/LE strength and endurance 2 x 3 mins with assist for LEs when fatigued.  Gait training 2 x 15' with 3 musketeers, assist to place L LE and control in stance.  Manual facilitation for wt shifts B.  Stairs with B hand rails + 2 assist pt 40% with increased knee buckling with fatigue.  Pt with improved activity tolerance this session.  Showing some anticipatory awareness as she questioned the next steps after leaving the hospital.  Individual therapy   Barney Gertsch 04/20/2011, 12:20 PM

## 2011-04-20 NOTE — Progress Notes (Signed)
Subjective/Complaints: Review of Systems  Constitutional: Positive for malaise/fatigue.  Neurological: Positive for tremors.  All other systems reviewed and are negative.  1/28-  Objective Vital Signs: Blood pressure 131/77, pulse 90, temperature 98.7 F (37.1 C), temperature source Oral, resp. rate 28, weight 67.858 kg (149 lb 9.6 oz), last menstrual period 03/24/2011, SpO2 97.00%. No results found. No results found for this basename: WBC:2,HGB:2,HCT:2,PLT:2 in the last 72 hours  Basename 04/19/11 0500 04/18/11 0500  NA 141 140  K 5.1 4.4  CL 106 106  CO2 24 24  GLUCOSE 95 94  BUN 61* 62*  CREATININE 2.62* 2.61*  CALCIUM 8.1* 8.2*   CBG (last 3)  No results found for this basename: GLUCAP:3 in the last 72 hours  Wt Readings from Last 3 Encounters:  04/15/11 67.858 kg (149 lb 9.6 oz)  04/10/11 69.536 kg (153 lb 4.8 oz)    Physical Exam:  General appearance: no distress and slowed mentation Head: Normocephalic, without obvious abnormality, atraumatic Eyes: conjunctivae/corneas clear. PERRL, EOM's intact. Fundi benign. Ears: normal TM's and external ear canals both ears Nose: Nares normal. Septum midline. Mucosa normal. No drainage or sinus tenderness. Throat: lips, mucosa, and tongue normal; teeth and gums normal Neck: no adenopathy, no carotid bruit, no JVD, supple, symmetrical, trachea midline and thyroid not enlarged, symmetric, no tenderness/mass/nodules Back: symmetric, no curvature. ROM normal. No CVA tenderness. Resp: clear to auscultation bilaterally Cardio: regular rate and rhythm, S1, S2 normal, no murmur, click, rub or gallop and normal apical impulse GI: soft, non-tender; bowel sounds normal; no masses,  no organomegaly Extremities:BLE trace to 1+ edema still Pulses: 2+ and symmetric Skin: Skin color, texture, turgor normal. No rashes or lesions Neurologic: delayed processing and word finding deficits still.   Oriented to name and hospital. Speech slurred  and low volume.  UE's grossly 2+ to 3+/5.  LE's 2/5 prox to 3/5 distally.  Withdraws to pain on all 4's. DTR's 2-3+.  Follows all simple 1 step commands. Incision/Wound: n/a  exam updated 1/28  Assessment/Plan: 1. Functional deficits secondary to anoxic brain injury which require 3+ hours per day of interdisciplinary therapy in a comprehensive inpatient rehab setting. Physiatrist is providing close team supervision and 24 hour management of active medical problems listed below. Physiatrist and rehab team continue to assess barriers to discharge/monitor patient progress toward functional and medical goals. Mobility: Bed Mobility Supine to Sit: 3: Mod assist Sitting - Scoot to Edge of Bed: 3: Mod assist Transfers Sit to Stand: 3: Mod assist Sit to Stand Details (indicate cue type and reason): pt can only tolerate standing for 20 seconds or less Ambulation/Gait Ambulation/Gait Assistance: Not tested (comment)   ADL:   Cognition: Cognition Overall Cognitive Status: Impaired Arousal/Alertness: Lethargic Orientation Level: Oriented X4 Attention: Sustained Focused Attention: Appears intact Sustained Attention: Impaired Sustained Attention Impairment: Functional basic;Verbal basic Memory: Impaired Memory Impairment: Decreased short term memory;Decreased recall of new information Decreased Short Term Memory: Verbal basic;Functional basic Awareness: Impaired Awareness Impairment: Emergent impairment Problem Solving: Impaired Problem Solving Impairment: Functional basic Executive Function: Self Monitoring;Self Correcting;Organizing Reasoning: Impaired Reasoning Impairment: Verbal basic;Functional basic Organizing: Impaired Organizing Impairment: Verbal basic;Functional basic Self Monitoring: Impaired Self Monitoring Impairment: Verbal basic Self Correcting: Impaired Self Correcting Impairment: Verbal basic Safety/Judgment: Impaired Cognition Arousal/Alertness:  Lethargic Orientation Level: Oriented X4  1 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  2. DVT Prophylaxis/Anticoagulation: SCDs.dopplers negative. 3. Seizure disorder. Valproate 1000 mg every 12 hours. Latest valproic acid level January 15 of 42.6. Monitor closely for  any increased seizure activity. 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   4. Hypertension.  Norvasc increased to 10mg . clonidine patch 0.3 mg change weekly, hydralazine 25 mg 4 times daily, Lopressor 100 mg twice daily. Monitor closely with increased activity. BP's showing improvement despite this AM's reading. 5 Renal failure. 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 Await renal biopsy completed January 18.. Strict I and O.'s and f/u per nephrology. 6. Nutrition: Continue to encourage intake. . Valproic acid should help with appetite as well.  -megace helping. 7. Cardiomyopathy--?hypertensive vs viral  -follow up per cards  -increased lasix.  Elevate legs. Check weight qod  -recheck cxr tomorrow  -denies and SOB at present and is tolerating activities with therapy 8. Temp-   -avelox for ?pneumonia--i favor stopping this soon.  -bcx collected and negative.   -dopplers negative  -recheck cxr stable (mild chf)   LOS (Days) 7 A FACE TO FACE EVALUATION WAS PERFORMED  SWARTZ,ZACHARY T 04/20/2011, 7:21 AM

## 2011-04-20 NOTE — Progress Notes (Signed)
Speech Language Pathology Therapy Note  Patient Details  Name: Heather Sims MRN: [id number] Date of Birth: December 05, 1990  Today's Date: 04/20/2011 Time: 1400-1430 Time Calculation (min): 30 min  Precautions: Precautions Precautions: Fall Restrictions Weight Bearing Restrictions: No   Skilled Therapeutic Interventions: Treatment focus on sustained attention and functional communication. Max verbal and demonstration cues needed to increase vocal intensity and over articulate during phrase level utterances. Pt became emotional during session due to decreased frustration tolerance and overall increased intellectual awareness into deficits. Pt sustained attention to tasks for ~1 minute. Functional problem solving tasks with Max A verbal and visual cues.   Pain: No/Denies Pain  Therapy/Group: Individual Therapy  Bria Portales 04/20/2011 4:07 PM

## 2011-04-20 NOTE — Progress Notes (Signed)
Subjective: Doing well, no complaints this am.   Objective: Vital signs in last 24 hours:  Temp:  [98.4 F (36.9 C)-98.7 F (37.1 C)] 98.7 F (37.1 C) (01/28 0500) Pulse Rate:  [86-90] 90  (01/28 0500) Resp:  [28-32] 28  (01/28 0500) BP: (131-137)/(77-82) 131/77 mmHg (01/28 0500) SpO2:  [96 %-97 %] 97 % (01/28 0500)  Weight change:   Intake/Output: I/O last 3 completed shifts: In: 480 [P.O.:480] Out: 1051 [Urine:1050; Stool:1]   Intake/Output this shift:    Gen:  In bed, eating breakfast, nad.  Slow to answer questions.  CVS- RRR RS- CTA ABD- BS present soft, mild distention EXT- 1-2+ LE edema  Lab Results: No results found for this basename: WBC:3,HGB:3,HCT:3,PLT:3 in the last 72 hours BMET  Basename 04/19/11 0500 04/18/11 0500  NA 141 140  K 5.1 4.4  CL 106 106  CO2 24 24  GLUCOSE 95 94  BUN 61* 62*  CREATININE 2.62* 2.61*  CALCIUM 8.1* 8.2*  PHOS 5.6* 5.6*   LFT  Basename 04/19/11 0500  PROT --  ALBUMIN 1.6*  AST --  ALT --  ALKPHOS --  BILITOT --  BILIDIR --  IBILI --   PT/INR No results found for this basename: LABPROT:2,INR:2 in the last 72 hours Hepatitis Panel No results found for this basename: HEPBSAG,HCVAB,HEPAIGM,HEPBIGM in the last 72 hours  Studies/Results: No results found.  Scheduled Meds:   . acetaminophen  650 mg Oral Once  . amLODipine  10 mg Oral Daily  . clonazePAM  0.5 mg Oral Daily  . cloNIDine  0.1 mg Transdermal Weekly  . darbepoetin (ARANESP) injection - NON-DIALYSIS  100 mcg Subcutaneous Q Thu-1800  . divalproex  1,000 mg Oral Q12H  . famotidine  20 mg Oral Daily  . feeding supplement  237 mL Oral BID  . ferrous sulfate  325 mg Oral TID WC  . furosemide  40 mg Oral TID  . hydrALAZINE  50 mg Oral QID  . megestrol  400 mg Oral Daily  . metoprolol tartrate  100 mg Oral BID  . moxifloxacin  400 mg Oral q1800  . senna  1 tablet Oral BID  . DISCONTD: potassium chloride  20 mEq Oral Daily   Continuous Infusions:    PRN Meds:.acetaminophen, diphenoxylate-atropine, promethazine, promethazine, promethazine, senna-docusate   Assessment/Plan: 1. AKI, proteinuria, low C3 -Cr has been stable over past few days. Continue to follow. 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 I think most recent increase in creatinine is due to overcontrol of BP and mild ATN, pressures in better range with decrease in clonidine. Recheck Cr. Tomorrow.  2. Malignant HTN - better, on 4 BP meds Volume excess resolved and making good amounts of urine. Continue lasix. BP looks good today. Aim for 130-150 SBP   3. S/P arrest with anoxic brain injury- per rehab   4. Anemia- on aranesp   5. Peri-renal hematoma, s/p biopsy, seen at time of bx by [place] per IR.Marland Kitchen No hematuria, asymptomatic. CBC with mild drop in Hb 9.9->8.1 on 1/25. Cont aranesp   6. Fever with CXR findings- Afebrile yesterday. A not that suggestive of UTI, ucx with no growth. Continue avelox to cover possible pna, day 5/10     LOS: 7 Romani Wilbon @TODAY @7 :08 AM

## 2011-04-21 ENCOUNTER — Inpatient Hospital Stay (HOSPITAL_COMMUNITY): Payer: Medicaid Other

## 2011-04-21 DIAGNOSIS — I509 Heart failure, unspecified: Secondary | ICD-10-CM

## 2011-04-21 DIAGNOSIS — Z5189 Encounter for other specified aftercare: Secondary | ICD-10-CM

## 2011-04-21 DIAGNOSIS — R5381 Other malaise: Secondary | ICD-10-CM

## 2011-04-21 DIAGNOSIS — R569 Unspecified convulsions: Secondary | ICD-10-CM

## 2011-04-21 DIAGNOSIS — G931 Anoxic brain damage, not elsewhere classified: Secondary | ICD-10-CM

## 2011-04-21 LAB — CBC
MCH: 29.4 pg (ref 26.0–34.0)
MCV: 89.2 fL (ref 78.0–100.0)
Platelets: 297 10*3/uL (ref 150–400)
RBC: 2.86 MIL/uL — ABNORMAL LOW (ref 3.87–5.11)
RDW: 14.6 % (ref 11.5–15.5)
WBC: 10.3 10*3/uL (ref 4.0–10.5)

## 2011-04-21 LAB — RENAL FUNCTION PANEL
Albumin: 1.7 g/dL — ABNORMAL LOW (ref 3.5–5.2)
Chloride: 107 mEq/L (ref 96–112)
Creatinine, Ser: 2.8 mg/dL — ABNORMAL HIGH (ref 0.50–1.10)
GFR calc non Af Amer: 23 mL/min — ABNORMAL LOW (ref >90)
Phosphorus: 6 mg/dL — ABNORMAL HIGH (ref 2.3–4.6)
Potassium: 5.3 mEq/L — ABNORMAL HIGH (ref 3.5–5.1)

## 2011-04-21 NOTE — Progress Notes (Addendum)
Patient ID: Heather Sims, female   DOB: 04/04/1990, 21 y.o.   MRN: [id number] Subjective/Complaints: Review of Systems  Constitutional: Positive for malaise/fatigue.  Neurological: Positive for tremors.  All other systems reviewed and are negative.  1/29---no new issues  Objective Vital Signs: Blood pressure 158/84, pulse 93, temperature 99.2 F (37.3 C), temperature source Oral, resp. rate 22, weight 67.858 kg (149 lb 9.6 oz), last menstrual period 03/24/2011, SpO2 97.00%. No results found. No results found for this basename: WBC:2,HGB:2,HCT:2,PLT:2 in the last 72 hours  Basename 04/19/11 0500  NA 141  K 5.1  CL 106  CO2 24  GLUCOSE 95  BUN 61*  CREATININE 2.62*  CALCIUM 8.1*   CBG (last 3)  No results found for this basename: GLUCAP:3 in the last 72 hours  Wt Readings from Last 3 Encounters:  04/15/11 67.858 kg (149 lb 9.6 oz)  04/10/11 69.536 kg (153 lb 4.8 oz)    Physical Exam:  General appearance: no distress and slowed mentation Head: Normocephalic, without obvious abnormality, atraumatic Eyes: conjunctivae/corneas clear. PERRL, EOM's intact. Fundi benign. Ears: normal TM's and external ear canals both ears Nose: Nares normal. Septum midline. Mucosa normal. No drainage or sinus tenderness. Throat: lips, mucosa, and tongue normal; teeth and gums normal Neck: no adenopathy, no carotid bruit, no JVD, supple, symmetrical, trachea midline and thyroid not enlarged, symmetric, no tenderness/mass/nodules Back: symmetric, no curvature. ROM normal. No CVA tenderness. Resp: clear to auscultation bilaterally Cardio: regular rate and rhythm, S1, S2 normal, no murmur, click, rub or gallop and normal apical impulse GI: soft, non-tender; bowel sounds normal; no masses,  no organomegaly Extremities:BLE trace to 1+ edema still Pulses: 2+ and symmetric Skin: Skin color, texture, turgor normal. No rashes or lesions Neurologic: delayed processing and word finding deficits perhaps a  little better.l.   Oriented to name and hospital. Speech slurred and low volume.  UE's grossly 3 to 3+/5.  LE's 2+/5 prox to 3/5 distally.  Withdraws to pain on all 4's. DTR's 2-3+.  Follows all simple 1 step commands. Incision/Wound: n/a  exam updated 1/29  Assessment/Plan: 1. Functional deficits secondary to anoxic brain injury which require 3+ hours per day of interdisciplinary therapy in a comprehensive inpatient rehab setting. Physiatrist is providing close team supervision and 24 hour management of active medical problems listed below. Physiatrist and rehab team continue to assess barriers to discharge/monitor patient progress toward functional and medical goals. Mobility: Bed Mobility Supine to Sit: 3: Mod assist Sitting - Scoot to Edge of Bed: 3: Mod assist Transfers Sit to Stand: 3: Mod assist Sit to Stand Details (indicate cue type and reason): pt can only tolerate standing for 20 seconds or less Ambulation/Gait Ambulation/Gait Assistance: Not tested (comment)   ADL:   Cognition: Cognition Overall Cognitive Status: Impaired Arousal/Alertness: Lethargic Orientation Level: Oriented X4 Attention: Sustained Focused Attention: Appears intact Sustained Attention: Impaired Sustained Attention Impairment: Functional basic;Verbal basic Memory: Impaired Memory Impairment: Decreased short term memory;Decreased recall of new information Decreased Short Term Memory: Verbal basic;Functional basic Awareness: Impaired Awareness Impairment: Emergent impairment Problem Solving: Impaired Problem Solving Impairment: Functional basic Executive Function: Self Monitoring;Self Correcting;Organizing Reasoning: Impaired Reasoning Impairment: Verbal basic;Functional basic Organizing: Impaired Organizing Impairment: Verbal basic;Functional basic Self Monitoring: Impaired Self Monitoring Impairment: Verbal basic Self Correcting: Impaired Self Correcting Impairment: Verbal  basic Safety/Judgment: Impaired Cognition Arousal/Alertness: Lethargic Orientation Level: Oriented X4  1 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  2. DVT Prophylaxis/Anticoagulation: SCDs.dopplers negative. 3. Seizure disorder. Valproate 1000 mg every 12 hours. Latest  valproic acid level January 15 of 42.6. Monitor closely for any increased seizure activity. 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   4. Hypertension.  Norvasc increased to 10mg . clonidine patch 0.3 mg change weekly, hydralazine 25 mg 4 times daily, Lopressor 100 mg twice daily. Monitor closely with increased activity. BP's showing improvement despite this AM's reading. 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 Mgt per nephrology  6. Nutrition: Continue to encourage intake. . Valproic acid should help with appetite as well.  -megace helping. 7. Cardiomyopathy--?hypertensive vs viral  -follow up per cards  -increased lasix to 80 tid  Elevate legs. Check weight qod and down a bit  -recheck cxr today  -denies and SOB at present and is tolerating activities with therapy 8. Temp-   -avelox for ?pneumonia--i favor stopping this soon.  -bcx collected and negative.   -dopplers negative  -recheck cxr stable (mild chf)-    LOS (Days) 8 A FACE TO FACE EVALUATION WAS PERFORMED  Tava Peery T 04/21/2011, 6:36 AM

## 2011-04-21 NOTE — Progress Notes (Signed)
Physical Therapy Note  Patient Details  Name: Heather Sims MRN: [id number] Date of Birth: 10-Mar-1991 Today's Date: 04/21/2011  Time: [phone] 56 minutes  Pt c/o L LE pain, RN aware, rests prn.  Co-tx with rec therapy for leisure incorporation.  Seated on theraball with +2 assist for balance. Wt shifts with manual facilitation and cues at hips for wt shifts all directions, assist for trunk control.  Gait with +2 assist 15'.  Pt with increased toe drag on L today requiring increased assist to progress L LE.  Pt with increased fatigue today requiring more frequent rests.  Standing with B UE support wt shifts, mini squats, pre gait stepping with Max manual cues and facilitation at L hip. Kinetron for closed chain LE strength and coordination.  Pt required min A for kinetron due to fatigue, weakness B LEs.  Pt continues to ask what the next stages are after the hospital.  Pt educated on next steps after rehab.  Individual therapy   Heather Sims 04/21/2011, 11:57 AM

## 2011-04-21 NOTE — Progress Notes (Signed)
Speech Language Pathology Therapy Note  Patient Details  Name: Heather Sims MRN: [id number] Date of Birth: 1990-07-14  Today's Date: 04/21/2011 Time: 4098-1191 Time Calculation (min): 60 min  Precautions: Precautions Precautions: Fall Restrictions Weight Bearing Restrictions: No  Skilled Therapeutic Interventions: Pt lethargic throughout session. Treatment focus on problem solving and attention.  Max A verbal and visual cues needed for sustained attention to functional and familiar task of making pudding. Mod A question cues needed to recall directions for making pudding. Max a verbal and question cues needed for functional problem solving throughout task. Pt with increased intelligibility during functional communication at 80% during conversational speech.   Pain: No/Denies Pain    Therapy/Group: Individual Therapy  Farryn Linares 04/21/2011 5:06 PM

## 2011-04-21 NOTE — Progress Notes (Signed)
Patient called for assist to bathroom and after toileting patient lost balance and was lowered to floor  At 1240. Vital signs W.N.L. No injury sustained. D Anguilli PA notified at 1300.patient's mother notified of occurrence and staff educated to transfer in 2 steps squat pivot to and from toilet. No orders received . Patient remains mod assist to transfer to bed from chair. Patient set up for all meals feeds self with extra time . Appetite increasing . Patient 's mother bringing in food from outside source. Bilateral lower extremities remain edematous . Elevated while in chair and bed. tht intact. Continue with plan of care.                                                                               Cleotilde Neer

## 2011-04-21 NOTE — Progress Notes (Signed)
Subjective: Doing well, no complaints this am.   Objective: Vital signs in last 24 hours:  Temp:  [98.2 F (36.8 C)-99.2 F (37.3 C)] 99.2 F (37.3 C) (01/29 0530) Pulse Rate:  [77-93] 93  (01/29 0530) Resp:  [18-22] 22  (01/29 0530) BP: (130-158)/(8-86) 158/84 mmHg (01/29 0530) SpO2:  [97 %-99 %] 97 % (01/29 0530)  Weight change:   Intake/Output: I/O last 3 completed shifts: In: 1100 [P.O.:1000; Other:100] Out: 700 [Urine:700]   Intake/Output this shift:    Gen:  In bed, eating breakfast, nad.  Slow to answer questions.  CVS- RRR RS- CTA ABD- BS present soft, mild distention EXT- 1+ LE edema  Lab Results:  [facility] 04/21/11 0645  WBC 10.3  HGB 8.4*  HCT 25.5*  PLT 297   BMET  Basename 04/21/11 0645 04/19/11 0500  NA 143 141  K 5.3* 5.1  CL 107 106  CO2 25 24  GLUCOSE 96 95  BUN 54* 61*  CREATININE 2.80* 2.62*  CALCIUM 8.6 8.1*  PHOS 6.0* 5.6*   LFT  Basename 04/21/11 0645  PROT --  ALBUMIN 1.7*  AST --  ALT --  ALKPHOS --  BILITOT --  BILIDIR --  IBILI --   PT/INR No results found for this basename: LABPROT:2,INR:2 in the last 72 hours Hepatitis Panel No results found for this basename: HEPBSAG,HCVAB,HEPAIGM,HEPBIGM in the last 72 hours  Studies/Results: Dg Chest 2 View  04/21/2011  *RADIOLOGY REPORT*  Clinical Data: Cough, shortness of breath  CHEST - 2 VIEW  Comparison: 04/17/2011  Findings: There is cardiomegaly with mild vascular congestion. Focal left lower lobe airspace opacity.  Cannot exclude pneumonia. Possible small left pleural effusion.  Right lung is clear.  Edema pattern has improved since prior study.  IMPRESSION: Cardiomegaly.  Improving pulmonary edema.  Focal left lower lobe airspace opacity with small left effusion. Cannot exclude pneumonia.  Original Report Authenticated By: Cyndie Chime, M.D.    Scheduled Meds:    . acetaminophen  650 mg Oral Once  . amLODipine  10 mg Oral Daily  . clonazePAM  0.5 mg Oral Daily  .  cloNIDine  0.1 mg Transdermal Weekly  . darbepoetin (ARANESP) injection - NON-DIALYSIS  100 mcg Subcutaneous Q Thu-1800  . divalproex  1,000 mg Oral Q12H  . famotidine  20 mg Oral Daily  . feeding supplement  237 mL Oral BID  . ferrous sulfate  325 mg Oral TID WC  . furosemide  80 mg Oral TID  . hydrALAZINE  50 mg Oral QID  . megestrol  400 mg Oral Daily  . metoprolol tartrate  100 mg Oral BID  . moxifloxacin  400 mg Oral q1800  . senna  1 tablet Oral BID  . DISCONTD: furosemide  40 mg Oral TID   Continuous Infusions:  PRN Meds:.acetaminophen, diphenoxylate-atropine, promethazine, promethazine, promethazine, senna-docusate   Assessment/Plan: 1. AKI, proteinuria, low C3 -Cr up mildly today 2.6-->2.8. Continue to follow. 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 I think most recent increase in creatinine is due to overcontrol of BP and mild ATN, pressures in better range with decrease in clonidine. Continue to follow Cr, hopefully will start coming down soon.  Not a whole lot of increase in uop with increased lasix.   2. Malignant HTN - better, on 4 BP meds Volume excess resolved and making good amounts  of urine. Continue lasix. BP looks good today. Aim for 130-150 SBP   3. S/P arrest with anoxic brain injury- per rehab   4. Anemia- on aranesp   5. Peri-renal hematoma, s/p biopsy, seen at time of bx by [place] per IR.Marland Kitchen No hematuria, asymptomatic. hgb stable.  Cont aranesp   6. Fever with CXR findings- Afebrile . A not that suggestive of UTI, ucx with no growth. Avelox day 6     LOS: 8 Howell Groesbeck @TODAY @8 :44 AM

## 2011-04-21 NOTE — Progress Notes (Signed)
Physical Therapy Note  Patient Details  Name: Heather Sims MRN: [id number] Date of Birth: [birth year] Today's Date: 04/21/2011  Time In:  1500  Time Out:  1530  Patient denies pain.  Individual session.  Treatment focused on neuro-reducation techniques in kneeling with emphasis on trunk stability, alignment, weight shifting, balance, transitional movements and controlled mat to wheelchair transfers.  Patient very motivated.     Norton Pastel 04/21/2011, 4:11 PM

## 2011-04-21 NOTE — Progress Notes (Signed)
Physical Therapy Weekly Progress Note  Patient Details  Name: Heather Sims MRN: [id number] Date of Birth: 1990/04/04  Today's Date: 04/21/2011  Patient has met 0 of 4 short term goals.  Pt is currently min-mod A with transfers depending on fatigue, requires + 2 assist to gait and perform stairs, min A with w/c mobility in home environment due to difficulty with obstacle negotiation in tight spaces.  Patient continues to demonstrate the following deficits: decreased activity tolerance, myclonic muscle spasms, decreased trunk and LE control, impaired balance, decreased functional mobility, delayed processing and motor planning, decreased coordination and therefore will continue to benefit from skilled PT intervention to enhance overall performance with activity tolerance, balance, postural control, ability to compensate for deficits, awareness and coordination.  Patient not progressing toward long term goals.  See goal revision..  Continue plan of care.  PT Short Term Goals PT Short Term Goal 1: Perform bed mobility with supervision. PT Short Term Goal 1 - Progress: Progressing toward goal PT Short Term Goal 2: Pt will transfer bed<> w/c with min A 4/4 trials PT Short Term Goal 2 - Progress: Progressing toward goal PT Short Term Goal 3: Pt will propel w/c 150' in controlled environment, x 50' in home environment with supervision PT Short Term Goal 3 - Progress: Progressing toward goal PT Short Term Goal 4: Pt will tolerate standing x 2 minutes with min A. PT Short Term Goal 4 - Progress: Progressing toward goal    Heather Sims 04/21/2011, 12:47 PM

## 2011-04-21 NOTE — Progress Notes (Signed)
I have seen and examined this patient and agree with the plan of care C3 nephropathy. Will need confirmation from [facility] pathology. Not sure additional therapy indicated as may be non specific finding on biopsy. Continue diuresis. Khadar Monger W 04/21/2011, 11:04 AM

## 2011-04-21 NOTE — Progress Notes (Signed)
Occupational Therapy Session Note  Patient Details  Name: Heather Sims MRN: [id number] Date of Birth: [birth year]  Today's Date: 04/21/2011 Time: 0730-0825 Time Calculation (min): 55 min  Precautions: Precautions Precautions: Fall Restrictions Weight Bearing Restrictions: No  Short Term Goals: OT Short Term Goal 1: Pt will tolerate standing at sink for 45 seconds to enable her to be more independent with LB adls. OT Short Term Goal 2: Pt will bathe with min assist. OT Short Term Goal 3: Pt will don underwear and pants with min assist. OT Short Term Goal 4: Pt will transfer to toilet with min assist. OT Short Term Goal 5: Pt will demonstrate improved static sitting balance of supervision to be able to bathe safely on tub bench.  Skilled Therapeutic Interventions/Progress Updates:    self care retraining at sink level: focusing on tasks sequencing and organization with simple problem solving, self corrections of error, using energy conservation techniques, sit to stand, standing balance with only one UE for support while pulling up pants in dressing, control decent (stand to sit) with reaching back for arm rest for safety, self feeding with attention to bite size and managing a cup without spillage, selective attention only got distracted by internal thoughts.  Pain  no c/o this am  Occupational Therapy Weekly Progress Note   Patient has met 2 of 5 short term goals.  Pt has been bathing both at the sink and in the shower. Pt can bathe with min A at sink level and mod A in shower. Pt is max A with LB dressing sit to stand and supervision for UB dressing. Pt's transfers are mod to max A, varying throughout a 24 hr period. Pt needs max A for toileting. Pt needs occassional min A with grooming (not doing hair brushing currently secondary to dreads.   Patient continues to demonstrate the following deficits:bilateral LE clonus, decreased standing tolerance/ endurance, activity tolerance, left  LE continues to be weaker than right, decrease hip strength, decreased trunk control, control over gross or fine movements- impacting her completion of ADLs and therefore will continue to benefit from skilled OT intervention to enhance overall performance with ADL.  Patient not progressing toward long term goals.  See goal revision.. Goals have been downgraded overall to min A.  Continue plan of care.  OT Short Term Goals OT Short Term Goal 1: Pt will tolerate standing at sink for 45 seconds to enable her to be more independent with LB adls. OT Short Term Goal 2: Pt will bathe with min assist. OT Short Term Goal 3: Pt will don underwear and pants with min assist. OT Short Term Goal 4: Pt will transfer to toilet with min assist. OT Short Term Goal 5: Pt will demonstrate improved static sitting balance of supervision to be able to bathe safely on tub bench.    Therapy/Group: Individual Therapy  [name] 04/21/2011, 12:20 PM

## 2011-04-21 NOTE — Progress Notes (Signed)
Patient Details  Name: Gissel Keilman MRN: [id number] Date of Birth: December 18, 1990  Today's Date: 04/21/2011 Time: 930-10 Skilled Therapeutic Interventions/Progress Updates: Sat on physio-ball for balance activities simulating horseback riding with +2 assist for manual facilitation and cues at hips for wt shifts all directions, assist for trunk control. Gait with +2 assist 15'. Pt with increased toe drag on L today requiring increased assist to progress L LE.  No c/o pain.  Therapy/Group: Other: co-treat with PT  Activity Level: Moderate:  Level of assist: Total Assist+2 930-10 Mariadelosang Wynns 04/21/2011, 4:32 PM

## 2011-04-22 DIAGNOSIS — G931 Anoxic brain damage, not elsewhere classified: Secondary | ICD-10-CM

## 2011-04-22 DIAGNOSIS — R5381 Other malaise: Secondary | ICD-10-CM

## 2011-04-22 DIAGNOSIS — I509 Heart failure, unspecified: Secondary | ICD-10-CM

## 2011-04-22 DIAGNOSIS — R569 Unspecified convulsions: Secondary | ICD-10-CM

## 2011-04-22 DIAGNOSIS — Z5189 Encounter for other specified aftercare: Secondary | ICD-10-CM

## 2011-04-22 LAB — CULTURE, BLOOD (ROUTINE X 2)
Culture  Setup Time: 201301241424
Culture: NO GROWTH

## 2011-04-22 NOTE — Plan of Care (Signed)
Problem: RH BLADDER ELIMINATION Goal: RH STG MANAGE BLADDER WITH ASSISTANCE STG Manage Bladder With Assistance  Outcome: Progressing Timed toileting initiated q 2hours while awake.

## 2011-04-22 NOTE — Progress Notes (Signed)
Speech Language Pathology Progress & Therapy Note  Patient Details  Name: Heather Sims MRN: [id number] Date of Birth: 1990-11-09  Today's Date: 04/22/2011 Time: 0900-0955 Time Calculation (min): 55 min  Precautions: Precautions Precautions: Fall Restrictions Weight Bearing Restrictions: No  Skilled Therapeutic Interventions: Pt more alert today. Treatment focus on 4 step sequencing with picture cards with Mod A from a field of 2 choices. Functional grooming task performed with Mod A verbal and questioning cues for sequencing and problem solving.  Sustained attention throughout tasks for ~1 minute.   Short Term Goals: 1. Pt will sustain attention to a functional task for 10 minutes with Mod verbal cues for redirection. (Not Met) 2. Pt will utilize external memory aids (schedule, calendar, etc.) to demonstrate recall of daily events with Min A verbal and question cues. (Not Met) 3. Pt will demonstrate functional problem solving with basic and familiar tasks with Mod verbal and visual cues. (Not Met) 4. Pt will demonstrate emergent awareness of cognitive deficits during functional and familiar tasks with Mod A verbal and question cues. (Not Met)  Progress Updates: Pt has not met any STG's this reporting period. Currently, pt is overall Max A for problem solving, sustained attention, and emergent awareness. Pt is also overall Mod A for recall of daily information. Pt continues to demonstrate decreased intelligibility at the sentence level but has demonstrated an increase in overall intellectual awareness and alertness within the past few days. Pt would benefit from continued SLP services to maximize cognitive function. Pt/family education ongoing.   New Short Term Goals: 1. Pt will sustain attention to a functional task for 5 minutes with Mod verbal cues for redirection.  2. Pt will utilize external memory aids (schedule, calendar, etc.) to demonstrate recall of daily events with Min A verbal  and question cues.  3. Pt will demonstrate functional problem solving with basic and familiar tasks with Mod verbal and visual cues.  4. Pt will demonstrate emergent awareness of cognitive deficits during functional and familiar tasks with Mod A verbal and question cues.    Pain Pain Assessment Pain Score: 0-No pain Faces Pain Scale: No hurt PAINAD (Pain Assessment in Advanced Dementia) Breathing: normal  Oral/Motor: Oral Motor/Sensory Function Overall Oral Motor/Sensory Function: Appears within functional limits for tasks assessed Motor Speech Overall Motor Speech: Appears within functional limits for tasks assessed Comprehension: Auditory Comprehension Overall Auditory Comprehension: Impaired Yes/No Questions: Within Functional Limits Commands: Impaired Multistep Basic Commands: Other (comment) (due to decreasd attention and processing) Conversation: Simple Interfering Components: Attention;Processing speed EffectiveTechniques: Extra processing time;Repetition Visual Recognition/Discrimination Discrimination: Within Function Limits Reading Comprehension Reading Status: Within funtional limits Expression: Expression Primary Mode of Expression: Verbal Verbal Expression Overall Verbal Expression: Appears within functional limits for tasks assessed Initiation: Impaired Level of Generative/Spontaneous Verbalization: Sentence Repetition: No impairment Naming: No impairment Pragmatics: Impairment Impairments: Abnormal affect Interfering Components: Attention;Speech intelligibility Effective Techniques:  (increased vocal intensity and over articulation) Written Expression Dominant Hand: Right Written Expression: Not tested  Therapy/Group: Individual Therapy  Maclane Holloran 04/22/2011 10:41 AM

## 2011-04-22 NOTE — Progress Notes (Signed)
Patient ID: Heather Sims, female   DOB: [birth year], 21 y.o.   MRN: [id number] Pt & mother given team conference report: ELOS 05/01/11  Goals: Min Assist w/c level.  Mother disappointed that ambulation not a goal at this time.  Also, concerned about how to have a ramp [house is a rental].  She hopes to learn how to bump w/c up & down the 2 entry steps.  PT made aware of this.

## 2011-04-22 NOTE — Progress Notes (Signed)
Physical Therapy Note  Patient Details  Name: Heather Sims MRN: [id number] Date of Birth: Jul 10, 1990 Today's Date: 04/22/2011  Time: 1115-1200 45 minutes  Pt c/o increased L foot pain with wt bearing today.  Eased with rest. RN made aware.  Passive HS stretch for c/o tightness.  Standing with RW wt shifts, pregait heel lifts with min manual cues for wt shifts.  Pt with more hesitancy to wt bear on L LE today due to pain.  Quadruped NMR for core and hip stabilization with manual facilitation at hips for wt shifts fwd/bkwd, alt UE lifts, lateral wt shifts.  Tall kneeling with +2 assist for trunk and hip control with focus on trunk and hip endurance, tall posture, wt shifts, attention to task.  Pt with increased fatigue after activity today, required more frequent rest breaks.  Individual therapy   Chisa Kushner 04/22/2011, 12:22 PM

## 2011-04-22 NOTE — Progress Notes (Signed)
Patient ID: Vianney Kopecky, female   DOB: [birth year], 21 y.o.   MRN: [id number] Patient ID: Kaylanie Capili, female   DOB: Sep 21, 1990, 21 y.o.   MRN: [id number] Subjective/Complaints: Review of Systems  Constitutional: Positive for malaise/fatigue.  Neurological: Positive for tremors.  All other systems reviewed and are negative.  1/30-feet/legs sore at night after therapy. Didn't take anything for it.  Says she occasionally feels down but as a whole denies being depressed  Objective Vital Signs: Blood pressure 153/85, pulse 96, temperature 98.7 F (37.1 C), temperature source Oral, resp. rate 20, weight 67.858 kg (149 lb 9.6 oz), last menstrual period 03/24/2011, SpO2 95.00%. Dg Chest 2 View  04/21/2011  *RADIOLOGY REPORT*  Clinical Data: Cough, shortness of breath  CHEST - 2 VIEW  Comparison: 04/17/2011  Findings: There is cardiomegaly with mild vascular congestion. Focal left lower lobe airspace opacity.  Cannot exclude pneumonia. Possible small left pleural effusion.  Right lung is clear.  Edema pattern has improved since prior study.  IMPRESSION: Cardiomegaly.  Improving pulmonary edema.  Focal left lower lobe airspace opacity with small left effusion. Cannot exclude pneumonia.  Original Report Authenticated By: Cyndie Chime, M.D.    Basename 04/21/11 0645  WBC 10.3  HGB 8.4*  HCT 25.5*  PLT 297    Basename 04/21/11 0645  NA 143  K 5.3*  CL 107  CO2 25  GLUCOSE 96  BUN 54*  CREATININE 2.80*  CALCIUM 8.6   CBG (last 3)  No results found for this basename: GLUCAP:3 in the last 72 hours  Wt Readings from Last 3 Encounters:  04/15/11 67.858 kg (149 lb 9.6 oz)  04/10/11 69.536 kg (153 lb 4.8 oz)    Physical Exam:  General appearance: no distress and slowed mentation Head: Normocephalic, without obvious abnormality, atraumatic Eyes: conjunctivae/corneas clear. PERRL, EOM's intact. Fundi benign. Ears: normal TM's and external ear canals both ears Nose: Nares normal. Septum  midline. Mucosa normal. No drainage or sinus tenderness. Throat: lips, mucosa, and tongue normal; teeth and gums normal Neck: no adenopathy, no carotid bruit, no JVD, supple, symmetrical, trachea midline and thyroid not enlarged, symmetric, no tenderness/mass/nodules Back: symmetric, no curvature. ROM normal. No CVA tenderness. Resp: clear to auscultation bilaterally Cardio: regular rate and rhythm, S1, S2 normal, no murmur, click, rub or gallop and normal apical impulse GI: soft, non-tender; bowel sounds normal; no masses,  no organomegaly Extremities:BLE trace to 1+ edema decreasing Pulses: 2+ and symmetric Skin: Skin color, texture, turgor normal. No rashes or lesions Neurologic: delayed processing and word finding deficits perhaps a little better.l.   Oriented to name and hospital. Speech slurred and low volume.  UE's grossly 3 to 3+/5.  LE's 2+/5 prox to 3/5 distally (inconsistent).  Withdraws to pain on all 4's. DTR's 2-3+.  Follows all simple 1 step commands. Needs cueing for phonation and attention. Incision/Wound: n/a  exam updated 1/30  Assessment/Plan: 1. Functional deficits secondary to anoxic brain injury which require 3+ hours per day of interdisciplinary therapy in a comprehensive inpatient rehab setting. Physiatrist is providing close team supervision and 24 hour management of active medical problems listed below. Physiatrist and rehab team continue to assess barriers to discharge/monitor patient progress toward functional and medical goals. Mobility: Bed Mobility Supine to Sit: 3: Mod assist Sitting - Scoot to Edge of Bed: 3: Mod assist Transfers Sit to Stand: 3: Mod assist Sit to Stand Details (indicate cue type and reason): pt can only tolerate standing for 20 seconds or less Ambulation/Gait  Ambulation/Gait Assistance: Not tested (comment)   ADL:   Cognition: Cognition Overall Cognitive Status: Impaired Arousal/Alertness: Lethargic Orientation Level: Oriented  X4 Attention: Sustained Focused Attention: Appears intact Sustained Attention: Impaired Sustained Attention Impairment: Functional basic;Verbal basic Memory: Impaired Memory Impairment: Decreased short term memory;Decreased recall of new information Decreased Short Term Memory: Verbal basic;Functional basic Awareness: Impaired Awareness Impairment: Emergent impairment Problem Solving: Impaired Problem Solving Impairment: Functional basic Executive Function: Self Monitoring;Self Correcting;Organizing Reasoning: Impaired Reasoning Impairment: Verbal basic;Functional basic Organizing: Impaired Organizing Impairment: Verbal basic;Functional basic Self Monitoring: Impaired Self Monitoring Impairment: Verbal basic Self Correcting: Impaired Self Correcting Impairment: Verbal basic Safety/Judgment: Impaired Cognition Arousal/Alertness: Lethargic Orientation Level: Oriented X4  1 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  2. DVT Prophylaxis/Anticoagulation: SCDs.dopplers negative. 3. Seizure disorder. Valproate 1000 mg every 12 hours. Latest valproic acid level January 15 of 42.6. Monitor closely for any increased seizure activity. 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   4. Hypertension.  Norvasc increased to 10mg . clonidine patch 0.3 mg change weekly, hydralazine 25 mg 4 times daily, Lopressor 100 mg twice daily. Monitor closely with increased activity. BP's showing improvement despite this AM's reading. 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 Mgt per nephrology- watch potassium levels 6. Nutrition: Continue to encourage intake. . Valproic acid should help with appetite as well.  -megace helping. 7. Cardiomyopathy--?hypertensive vs viral  -follow up per cards  -increased lasix to 80 tid  Elevate legs. -500cc yesterday  -recheck cxr shows improving pulmonary edema  -denies and SOB at present and is  tolerating activities with therapy 8. Temp-   -avelox--d/c this week.  -bcx collected and negative.   -dopplers negative   9. Mood-discussed with patient and mother.  She denies depression and didn't express an interest in counseling  -will monitor for now and i asked mom/pt to let me know if things should change    LOS (Days) 9 A FACE TO FACE EVALUATION WAS PERFORMED  SWARTZ,ZACHARY T 04/22/2011, 8:45 AM

## 2011-04-22 NOTE — Progress Notes (Signed)
Patient Details  Name: Heather Sims MRN: [id number] Date of Birth: [birth year]  Today's Date: 04/22/2011 Time: 11:30-11:58  Skilled Therapeutic Interventions/Progress Updates: Tall kneeling with +2 assist for trunk and hip control with focus on trunk and hip endurance, tall posture, wt shifts, attention to task while looking at information about [employer] equestrian team/program on computer. Pt with increased fatigue after activity today, required more frequent rest breaks.   Therapy/Group: Other: co-treat with PT  Activity Level: Moderate:  Level of assist: Mod Assist  [name] 04/22/2011, 12:30 PM

## 2011-04-22 NOTE — Progress Notes (Signed)
Social Work  Met this afternoon with patient's mother to provide emotional support. She is briefly tearful with me when expressing her disappointment that "I hoped she'd be better than this at this point...she moved so quick through the cardiac stuff but now she doesn't seem to be getting better". Discussed the slow, sometimes limited, cognitive recovery with hypoxic injuries.  Mother understands this, yet admits it is difficult to accept.  Encouraged her to get adequate rest and to include other family and friends in the support network.  She notes that "everybody was here at the beginning... Now it's really just me".  Discussed the local BI support group in order to connect with other parents.  Will also provide written materials about coping and continue to provide emotional support through CIR stay.  Alita Waldren

## 2011-04-22 NOTE — Progress Notes (Signed)
Subjective: Doing well, no complaints this am.   Objective: Vital signs in last 24 hours:  Temp:  [98 F (36.7 C)-99.1 F (37.3 C)] 98.7 F (37.1 C) (01/30 0619) Pulse Rate:  [80-96] 96  (01/30 0619) Resp:  [17-20] 20  (01/30 0619) BP: (109-153)/(60-90) 153/85 mmHg (01/30 0619) SpO2:  [95 %-100 %] 95 % (01/30 0619)  Weight change:   Intake/Output: I/O last 3 completed shifts: In: 480 [P.O.:380; Other:100] Out: 800 [Urine:800]   Intake/Output this shift:    Gen:  In bed, eating breakfast, nad.  Slow to answer questions.  CVS- RRR RS- CTA ABD- BS present soft, mild distention EXT- 1+ LE edema  Lab Results:  [facility] 04/21/11 0645  WBC 10.3  HGB 8.4*  HCT 25.5*  PLT 297   BMET  Basename 04/21/11 0645  NA 143  K 5.3*  CL 107  CO2 25  GLUCOSE 96  BUN 54*  CREATININE 2.80*  CALCIUM 8.6  PHOS 6.0*   LFT  Basename 04/21/11 0645  PROT --  ALBUMIN 1.7*  AST --  ALT --  ALKPHOS --  BILITOT --  BILIDIR --  IBILI --   PT/INR No results found for this basename: LABPROT:2,INR:2 in the last 72 hours Hepatitis Panel No results found for this basename: HEPBSAG,HCVAB,HEPAIGM,HEPBIGM in the last 72 hours  Studies/Results: Dg Chest 2 View  04/21/2011  *RADIOLOGY REPORT*  Clinical Data: Cough, shortness of breath  CHEST - 2 VIEW  Comparison: 04/17/2011  Findings: There is cardiomegaly with mild vascular congestion. Focal left lower lobe airspace opacity.  Cannot exclude pneumonia. Possible small left pleural effusion.  Right lung is clear.  Edema pattern has improved since prior study.  IMPRESSION: Cardiomegaly.  Improving pulmonary edema.  Focal left lower lobe airspace opacity with small left effusion. Cannot exclude pneumonia.  Original Report Authenticated By: Cyndie Chime, M.D.    Scheduled Meds:    . acetaminophen  650 mg Oral Once  . amLODipine  10 mg Oral Daily  . clonazePAM  0.5 mg Oral Daily  . cloNIDine  0.1 mg Transdermal Weekly  . darbepoetin  (ARANESP) injection - NON-DIALYSIS  100 mcg Subcutaneous Q Thu-1800  . divalproex  1,000 mg Oral Q12H  . famotidine  20 mg Oral Daily  . feeding supplement  237 mL Oral BID  . ferrous sulfate  325 mg Oral TID WC  . furosemide  80 mg Oral TID  . hydrALAZINE  50 mg Oral QID  . megestrol  400 mg Oral Daily  . metoprolol tartrate  100 mg Oral BID  . moxifloxacin  400 mg Oral q1800  . senna  1 tablet Oral BID   Continuous Infusions:  PRN Meds:.acetaminophen, diphenoxylate-atropine, promethazine, promethazine, promethazine, senna-docusate   Assessment/Plan: 1. AKI, proteinuria, low C3 -Cr up mildly yesterday, recheck in am. Continue to follow. 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   I think most recent increase in creatinine is due to overcontrol of BP and mild ATN, pressures in better range with decrease in clonidine. Continue to follow Cr, hopefully will start coming down soon.  Not a whole lot of increase in uop with increased lasix.   2. Malignant HTN - better, on 4 BP meds Volume excess resolved and making good amounts of urine. Continue lasix. BP looks good today. Aim for  130-150 SBP   3. S/P arrest with anoxic brain injury- per rehab   4. Anemia- on aranesp   5. Peri-renal hematoma, s/p biopsy, seen at time of bx by [place] per IR.Marland Kitchen No hematuria, asymptomatic. hgb stable.  Cont aranesp   6. Fever with CXR findings- Afebrile . A not that suggestive of UTI, ucx with no growth. Avelox day 7     LOS: 9 Sharada Albornoz @TODAY @7 :30 AM

## 2011-04-22 NOTE — Progress Notes (Signed)
Occupational Therapy Session Note  Patient Details  Name: Heather Sims MRN: [id number] Date of Birth: 07-Jan-1991  Today's Date: 04/22/2011 Time: 1015-1115 ( ) 14:15-14:45 ( )   Precautions: Precautions Precautions: Fall Restrictions Weight Bearing Restrictions: No  Short Term Goals: OT Short Term Goal 1: Pt will tolerate standing at sink for 45 seconds to enable her to be more independent with LB adls. OT Short Term Goal 2: Pt will bathe with min assist. OT Short Term Goal 3: Pt will don underwear and pants with min assist. OT Short Term Goal 4: Pt will transfer to toilet with min assist. OT Short Term Goal 5: Pt will demonstrate improved static sitting balance of supervision to be able to bathe safely on tub bench.  Skilled Therapeutic Interventions/Progress Updates:    1st session: self care retraining at sink level (pt's choice). Focused session on forward posture to increase ability to thread pants, sit to stand, standing balance with min A in order to perform LB clothing management (to pull up pants), slow and controlled movements with self awareness of LOB/ Pt more verbal today; when discussing memory of events - she recalls bits and pieces.  2nd session: focus on tall kneeling with forward weight shift without abnormal posturing on mat while coloring a picture tacked to wall. Pt able to tolerate tall kneeling position for entire session, required mod to max tactile cuing to hold proper upright posture. Pt had a bladder accident during session; transferred to toilet in bathroom, performing toileting sit to stand with steady A with verbal cues for postioning  and changed clothes and then transferred into bed     Pain  c/o pain in hamstrings and in bilateral feet. Feet very swollen today (RN, PA and MD aware)- both sessions  Therapy/Group: Individual Therapy  [name] 04/22/2011, 3:31 PM

## 2011-04-22 NOTE — Patient Care Conference (Signed)
Inpatient RehabilitationTeam Conference Note Date: 04/21/2011   Time: 2:37 PM    Patient Name: Heather Sims      Medical Record Number: [id number]  Date of Birth: 06-23-1990 Sex: Female         Room/Bed: 4025/4025-01 Payor Info: Payor:     Admitting Diagnosis: Anoxic BI, deconditioned  Admit Date/Time:  04/13/2011  5:00 PM Admission Comments: No comment available   Primary Diagnosis:  Anoxic brain injury Principal Problem: Anoxic brain injury  Patient Active Problem List  Diagnoses Date Noted  . Anoxic brain injury 04/14/2011  . Hypertension 04/07/2011  . ? Viral Cardiomyopathy 04/07/2011  . AKI (acute kidney injury) 03/26/2011  . Acute heart failure 03/26/2011  . Methylprednisolone-induced hypoxia 03/26/2011  . Pneumonia 03/26/2011  . Anemia 03/26/2011    Expected Discharge Date: Expected Discharge Date: 05/01/11  Team Members Present: Physician: Dr. Faith Rogue Case Manager Present: Melanee Spry, RN Social Worker Present: Amada Jupiter, LCSW PT Present: Reggy Eye, PT OT Present: Roney Mans, OT;Ardis Rowan, Corky Crafts, OT SLP Present: Feliberto Gottron, SLP RN Present: Carmie End    Current Status/Progress Goal Weekly Team Focus  Medical   heart failure an issue, diuresing her to remove fluid. still profoundly weak  increased activity tolerance and stamina.  pain control  diuresis   Bowel/Bladder   Continent most of the time with some incontinence at night.  LBM-04/20/11  Continent of bowel and bladder  Awaken patient for toileting if more than 4 hours have passed.   Swallow/Nutrition/ Hydration             ADL's   mod - max A  goals were downgraded to min a  controlled movements with functional transfers/ ADLs   Mobility   mod A  supervision  motor control, gait as able  Communication   Mod A  suoervision  increase vocal intensity, comprehension   Safety/Cognition/ Behavioral Observations  Max A  supervision  attention, problem solving    Pain   Complains of pain in dorsal side of right foot.  Pain <3  Use ice pack, tylenol as needed.   Skin   No skin issues  No new skin breakdown  Monitor skin condition every shift.      *See Interdisciplinary Assessment and Plan and progress notes for long and short-term goals  Barriers to Discharge: profound weakness and continued myoclonus    Possible Resolutions to Barriers:  strength training, NMR, family ed    Discharge Planning/Teaching Needs:  home with parents; mother able to provide 24/7      Team Discussion: Slow progress.  Increased awareness.  No goal for ambulation--squat pivot transfers.   Revisions to Treatment Plan:  Mobility goals downgraded to min assist txfrs-w/c level   Continued Need for Acute Rehabilitation Level of Care: The patient requires daily medical management by a physician with specialized training in physical medicine and rehabilitation for the following conditions: Daily direction of a multidisciplinary physical rehabilitation program to ensure safe treatment while eliciting the highest outcome that is of practical value to the patient.: Yes Daily medical management of patient stability for increased activity during participation in an intensive rehabilitation regime.: Yes Daily analysis of laboratory values and/or radiology reports with any subsequent need for medication adjustment of medical intervention for : Neurological problems;Cardiac problems  Brock Ra 04/22/2011, 4:37 PM

## 2011-04-23 ENCOUNTER — Inpatient Hospital Stay (HOSPITAL_COMMUNITY): Payer: Medicaid Other

## 2011-04-23 LAB — RENAL FUNCTION PANEL
Albumin: 2 g/dL — ABNORMAL LOW (ref 3.5–5.2)
CO2: 27 mEq/L (ref 19–32)
Calcium: 9 mg/dL (ref 8.4–10.5)
Creatinine, Ser: 2.79 mg/dL — ABNORMAL HIGH (ref 0.50–1.10)
GFR calc Af Amer: 27 mL/min — ABNORMAL LOW (ref >90)
GFR calc non Af Amer: 23 mL/min — ABNORMAL LOW (ref >90)
Phosphorus: 6.6 mg/dL — ABNORMAL HIGH (ref 2.3–4.6)
Sodium: 142 mEq/L (ref 135–145)

## 2011-04-23 MED ORDER — MEGESTROL ACETATE 400 MG/10ML PO SUSP
400.0000 mg | Freq: Two times a day (BID) | ORAL | Status: DC
  Administered 2011-04-23 – 2011-04-27 (×10): 400 mg via ORAL
  Filled 2011-04-23 (×13): qty 10

## 2011-04-23 MED ORDER — CLONIDINE HCL 0.2 MG/24HR TD PTWK: 0.2000 mg | MEDICATED_PATCH | TRANSDERMAL | Status: DC

## 2011-04-23 MED ORDER — SODIUM POLYSTYRENE SULFONATE 15 GM/60ML PO SUSP
15.0000 g | Freq: Once | ORAL | Status: AC
  Administered 2011-04-23: 15 g via ORAL
  Filled 2011-04-23: qty 60

## 2011-04-23 NOTE — Progress Notes (Signed)
Patient ID: Heather Sims, female   DOB: [birth year], 21 y.o.   MRN: [id number] Patient ID: Heather Sims, female   DOB: 01/05/1991, 21 y.o.   MRN: [id number] Patient ID: Heather Sims, female   DOB: [birth year], 21 y.o.   MRN: [id number] Subjective/Complaints: Review of Systems  Constitutional: Positive for malaise/fatigue.  Neurological: Positive for tremors.  All other systems reviewed and are negative.  1/31--reports difficulty sleeping last night.  More alert today  Objective Vital Signs: Blood pressure 176/102, pulse 98, temperature 99.1 F (37.3 C), temperature source Oral, resp. rate 20, weight 68.3 kg (150 lb 9.2 oz), last menstrual period 03/24/2011, SpO2 98.00%. No results found.  Basename 04/21/11 0645  WBC 10.3  HGB 8.4*  HCT 25.5*  PLT 297    Basename 04/23/11 0645 04/21/11 0645  NA 142 143  K 5.5* 5.3*  CL 102 107  CO2 27 25  GLUCOSE 89 96  BUN 46* 54*  CREATININE 2.79* 2.80*  CALCIUM 9.0 8.6   CBG (last 3)  No results found for this basename: GLUCAP:3 in the last 72 hours  Wt Readings from Last 3 Encounters:  04/22/11 68.3 kg (150 lb 9.2 oz)  04/10/11 69.536 kg (153 lb 4.8 oz)    Physical Exam:  General appearance: no distress and slowed mentation Head: Normocephalic, without obvious abnormality, atraumatic Eyes: conjunctivae/corneas clear. PERRL, EOM's intact. Fundi benign. Ears: normal TM's and external ear canals both ears Nose: Nares normal. Septum midline. Mucosa normal. No drainage or sinus tenderness. Throat: lips, mucosa, and tongue normal; teeth and gums normal Neck: no adenopathy, no carotid bruit, no JVD, supple, symmetrical, trachea midline and thyroid not enlarged, symmetric, no tenderness/mass/nodules Back: symmetric, no curvature. ROM normal. No CVA tenderness. Resp: clear to auscultation bilaterally Cardio: regular rate and rhythm, S1, S2 normal, no murmur, click, rub or gallop and normal apical impulse GI: soft, non-tender; bowel sounds  normal; no masses,  no organomegaly Extremities:BLE trace to 1+ edema decreasing Pulses: 2+ and symmetric Skin: Skin color, texture, turgor normal. No rashes or lesions Neurologic: delayed processing and word finding deficits perhaps a little better.Much more alert today.   Oriented to name and hospital. Speech slurred and low volume.  UE's grossly 3 to 3+/5.  LE's 2+/5 prox to 3/5 distally (inconsistent).  Withdraws to pain on all 4's. DTR's 2-3+.  Follows all simple 1 step commands. Needs cueing for phonation and attention. Incision/Wound: n/a  exam updated 1/31  Assessment/Plan: 1. Functional deficits secondary to anoxic brain injury which require 3+ hours per day of interdisciplinary therapy in a comprehensive inpatient rehab setting. Physiatrist is providing close team supervision and 24 hour management of active medical problems listed below. Physiatrist and rehab team continue to assess barriers to discharge/monitor patient progress toward functional and medical goals. Mobility: Bed Mobility Supine to Sit: 3: Mod assist Sitting - Scoot to Edge of Bed: 3: Mod assist Transfers Sit to Stand: 3: Mod assist Sit to Stand Details (indicate cue type and reason): pt can only tolerate standing for 20 seconds or less Ambulation/Gait Ambulation/Gait Assistance: Not tested (comment)   ADL:   Cognition: Cognition Overall Cognitive Status: Impaired Arousal/Alertness: Lethargic Orientation Level: Oriented X4 Attention: Sustained Focused Attention: Appears intact Sustained Attention: Impaired Sustained Attention Impairment: Verbal basic;Functional basic Memory: Impaired Memory Impairment: Decreased short term memory;Decreased recall of new information Decreased Short Term Memory: Functional basic;Verbal basic Awareness: Impaired Awareness Impairment: Emergent impairment;Other (comment) Problem Solving: Impaired Problem Solving Impairment: Verbal basic;Functional basic Executive  Function: Sequencing;Organizing;Initiating;Self Monitoring;Self  Correcting Reasoning: Impaired Reasoning Impairment: Verbal basic;Functional basic Sequencing: Impaired Sequencing Impairment: Functional basic Organizing: Impaired Organizing Impairment: Verbal basic;Functional basic Initiating: Impaired Initiating Impairment: Verbal basic;Functional basic Self Monitoring: Impaired Self Monitoring Impairment: Verbal basic;Functional basic Self Correcting: Impaired Self Correcting Impairment: Verbal basic;Functional basic Behaviors: Poor frustration tolerance Safety/Judgment: Impaired Comments: Pt with incerased insight into both physical and cognitive deficits with decreased frustration tolerance and emotional response.  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  2. DVT Prophylaxis/Anticoagulation: SCDs.dopplers negative. 3. Seizure disorder. Valproate 1000 mg every 12 hours. Latest valproic acid level January 15 of 42.6. Monitor closely for any increased seizure activity. 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   4. Hypertension.  Norvasc increased to 10mg . clonidine patch 0.3 mg change weekly, hydralazine 25 mg 4 times daily, Lopressor 100 mg twice daily. Monitor closely with increased activity. BP's showing improvement despite this AM's reading. 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 Mgt per nephrology- potassium level too high. 6. Nutrition: Continue to encourage intake. . Valproic acid should help with appetite as well.  -megace helping--will increase to bid 7. Cardiomyopathy--?hypertensive vs viral  -follow up per cards  -increased lasix to 80 tid  Elevate legs. -500cc yesterday  -recheck cxr shows improving pulmonary edema  -denies and SOB at present and is tolerating activities with therapy  -increased clonidine to  patch to 0.2. Needs more consistent control. 8. Temp-   -avelox--d/c tomorrow  -bcx collected and negative.   -dopplers negative   9. Mood-discussed with patient and mother.  She denies depression and didn't express an interest in counseling  -will monitor for now and i asked mom/pt to let me know if things should change    LOS (Days) 10 A FACE TO FACE EVALUATION WAS PERFORMED  SWARTZ,ZACHARY T 04/23/2011, 8:12 AM

## 2011-04-23 NOTE — Progress Notes (Signed)
Occupational Therapy Session Note  Patient Details  Name: Heather Sims MRN: [id number] Date of Birth: 1990-09-21  Today's Date: 04/23/2011 Time: 1015-1110 Time Calculation (min): 55 min 2nd session : 13:00-13:30 ( )  Precautions: Precautions Precautions: Fall Restrictions Weight Bearing Restrictions: No  Short Term Goals: OT Short Term Goal 1: Pt will tolerate standing at sink for 45 seconds to enable her to be more independent with LB adls. OT Short Term Goal 2: Pt will bathe with min assist. OT Short Term Goal 3: Pt will don underwear and pants with min assist. OT Short Term Goal 4: Pt will transfer to toilet with min assist. OT Short Term Goal 5: Pt will demonstrate improved static sitting balance of supervision to be able to bathe safely on tub bench.  Skilled Therapeutic Interventions/Progress Updates:  1st session: self care retraining at shower level. Focus on squat pivot transfers, sit to stand with attention to feet positioning, core/ trunk control with standing to pull up and pull down pants, activity tolerance with rest breaks frequently, threading pants using method to dress left LE first, sequencing and tasks organization with min questioning cues for basic problem solving, wrote down activities from morning therapies.  2nd session: neuromuscular reeducation: focusing in transfers (to w/c, bed, toilet), standing participating in game on the mirror encouraging left Hip activation , pelvic alignment, active trunk and gluts. Pt able to stand for 6 minutes at a time, increasing her ability to stand during ADL tasks     Pain C/o pain in bilateral feet- in both sessions - RN aware  Therapy/Group: Individual Therapy  [name] 04/23/2011, 2:55 PM

## 2011-04-23 NOTE — Progress Notes (Signed)
Subjective: Doing well, no complaints this am.   Objective: Vital signs in last 24 hours:  Temp:  [98.8 F (37.1 C)-99.1 F (37.3 C)] 99.1 F (37.3 C) (01/31 1308) Pulse Rate:  [88-98] 98  (01/31 0638) Resp:  [18-20] 20  (01/31 0638) BP: (146-176)/(78-102) 176/102 mmHg (01/31 0638) SpO2:  [98 %] 98 % (01/31 6578) Weight:  [150 lb 9.2 oz (68.3 kg)] 150 lb 9.2 oz (68.3 kg) (01/30 1640)  Weight change:   Intake/Output: I/O last 3 completed shifts: In: 300 [P.O.:300] Out: -    Intake/Output this shift:    Gen:  In bed, eating breakfast, nad.  Slow to answer questions.  CVS- RRR RS- CTA ABD- BS present soft, mild distention EXT- tr le edema  Lab Results:  [facility] 04/21/11 0645  WBC 10.3  HGB 8.4*  HCT 25.5*  PLT 297   BMET  Basename 04/21/11 0645  NA 143  K 5.3*  CL 107  CO2 25  GLUCOSE 96  BUN 54*  CREATININE 2.80*  CALCIUM 8.6  PHOS 6.0*   LFT  Basename 04/21/11 0645  PROT --  ALBUMIN 1.7*  AST --  ALT --  ALKPHOS --  BILITOT --  BILIDIR --  IBILI --   PT/INR No results found for this basename: LABPROT:2,INR:2 in the last 72 hours Hepatitis Panel No results found for this basename: HEPBSAG,HCVAB,HEPAIGM,HEPBIGM in the last 72 hours  Studies/Results: No results found.  Scheduled Meds:    . acetaminophen  650 mg Oral Once  . amLODipine  10 mg Oral Daily  . clonazePAM  0.5 mg Oral Daily  . cloNIDine  0.1 mg Transdermal Weekly  . darbepoetin (ARANESP) injection - NON-DIALYSIS  100 mcg Subcutaneous Q Thu-1800  . divalproex  1,000 mg Oral Q12H  . famotidine  20 mg Oral Daily  . feeding supplement  237 mL Oral BID  . ferrous sulfate  325 mg Oral TID WC  . furosemide  80 mg Oral TID  . hydrALAZINE  50 mg Oral QID  . megestrol  400 mg Oral Daily  . metoprolol tartrate  100 mg Oral BID  . moxifloxacin  400 mg Oral q1800  . senna  1 tablet Oral BID   Continuous Infusions:  PRN Meds:.acetaminophen, diphenoxylate-atropine, promethazine,  promethazine, promethazine, senna-docusate   Assessment/Plan: 1. AKI, proteinuria, low C3 -Cr up mildly yesterday, recheck in am. Continue to follow. 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   I think most recent increase in creatinine is due to overcontrol of BP and mild ATN, pressures in better range with decrease in clonidine. Cr remains stable, edema better, but still with abdominal distention (Abd [place] pend).    2. Malignant HTN - better, on 4 BP meds Volume excess resolved and making good amounts of urine. Continue lasix. BP going back up, can consider increasing clonidine to 0.2mg .  Aim for 130-150 SBP   3. S/P arrest with anoxic brain injury- per rehab   4. Anemia- on aranesp   5. Peri-renal hematoma, s/p biopsy, seen at time of bx by [place] per IR.Marland Kitchen No hematuria, asymptomatic. hgb stable.  Cont aranesp   6. Fever with CXR findings- Afebrile . A not that suggestive of UTI, ucx with no growth. 7 days of avelox, can d/c today.  7.  Hyperkalemia:  Potassium trending up, will give  kayexalate x1, recommend changing to renal diet with lower K+     LOS: 10 Rushi Chasen @TODAY @7 :22 AM

## 2011-04-23 NOTE — Progress Notes (Signed)
I have seen and examined this patient and agree with the plan of care stable patient will place on renal diaet and give kayexalate for hyperkalemia. Edema improving Bubber Rothert W 04/23/2011, 10:13 AM

## 2011-04-23 NOTE — Progress Notes (Signed)
Physical Therapy Note  Patient Details  Name: Heather Sims MRN: [id number] Date of Birth: June 08, 1990 Today's Date: 04/23/2011  Time: 915-958 43 minutes  Pt c/o L LE pain with wt bearing, eased with rest. RN aware. Pt with increased muscle tremors today, required mod A for transfers.  Pt with increased extension of L LE during transfers today, requiring total A to correct LOB.  Gait training in maxi sky 4 x 15' with manual facilitation at hips and trunk for stability, assist to progress RW. Pt smiled and expressed excitement with gait today.  W/c mobility with min A ? Due to fatigue after gait. Pt progressed to min A for bed/chair transfers by end of session.  Individual therapy   Marcy Sookdeo 04/23/2011, 10:31 AM

## 2011-04-23 NOTE — Progress Notes (Signed)
Speech Language Pathology Daily Session Note  Patient Details  Name: Heather Sims MRN: [id number] Date of Birth: 1990/08/05  Today's Date: 04/23/2011 Time: 4098-1191 Time Calculation (min): 60 min   Skilled Therapeutic Interventions: Treatment focus on attention and problem solving with functional tasks. Pt needs Mod A semantic cueing for sequencing and problem solving with meal tray set-up. Mod verbal cues for safety (locking breaks, etc) for transfers. Mod-Max semantic cues for problem solving/sequencing for making a calender. Pt overall more alert today and initiated conversation and humor throughout session. Selective attention to tasks for ~15 minutes with Mod A visual cues for redirection.    FIM:  Comprehension Comprehension: 3-Understands basic 50 - 74% of the time/requires cueing 25 - 50%  of the time Expression Expression: 4-Expresses basic 75 - 89% of the time/requires cueing 10 - 24% of the time. Needs helper to occlude trach/needs to repeat words. Social Interaction Social Interaction: 4-Interacts appropriately 75 - 89% of the time - Needs redirection for appropriate language or to initiate interaction. Problem Solving Problem Solving: 3-Solves basic 50 - 74% of the time/requires cueing 25 - 49% of the time Memory Memory: 3-Recognizes or recalls 50 - 74% of the time/requires cueing 25 - 49% of the time  Pain: No/Denies Pain  Therapy/Group: Individual Therapy  Rodd Heft 04/23/2011, 8:44 AM

## 2011-04-24 DIAGNOSIS — Z5189 Encounter for other specified aftercare: Secondary | ICD-10-CM

## 2011-04-24 DIAGNOSIS — R569 Unspecified convulsions: Secondary | ICD-10-CM

## 2011-04-24 DIAGNOSIS — R5381 Other malaise: Secondary | ICD-10-CM

## 2011-04-24 DIAGNOSIS — G931 Anoxic brain damage, not elsewhere classified: Secondary | ICD-10-CM

## 2011-04-24 DIAGNOSIS — I509 Heart failure, unspecified: Secondary | ICD-10-CM

## 2011-04-24 LAB — RENAL FUNCTION PANEL
Albumin: 2.2 g/dL — ABNORMAL LOW (ref 3.5–5.2)
BUN: 45 mg/dL — ABNORMAL HIGH (ref 6–23)
Calcium: 9 mg/dL (ref 8.4–10.5)
Chloride: 102 mEq/L (ref 96–112)
Creatinine, Ser: 2.88 mg/dL — ABNORMAL HIGH (ref 0.50–1.10)
GFR calc non Af Amer: 22 mL/min — ABNORMAL LOW (ref >90)
Phosphorus: 7.1 mg/dL — ABNORMAL HIGH (ref 2.3–4.6)

## 2011-04-24 MED ORDER — TRAMADOL HCL 50 MG PO TABS
25.0000 mg | ORAL_TABLET | Freq: Two times a day (BID) | ORAL | Status: DC | PRN
  Administered 2011-04-24 – 2011-04-27 (×4): 50 mg via ORAL
  Filled 2011-04-24: qty 1
  Filled 2011-04-24: qty 2
  Filled 2011-04-24 (×2): qty 1

## 2011-04-24 NOTE — Plan of Care (Signed)
Problem: RH BOWEL ELIMINATION Goal: RH STG MANAGE BOWEL W/EQUIPMENT W/ASSISTANCE STG Manage Bowel With Equipment With Assistance  Outcome: Progressing Patient will be mod independent with bowel management  At discharge  Problem: RH BLADDER ELIMINATION Goal: RH STG MANAGE BLADDER WITH ASSISTANCE STG Manage Bladder With Assistance  Outcome: Progressing Patient will be mod independent with bladder management  At discharge.   Problem: RH PAIN MANAGEMENT Goal: RH STG PAIN MANAGED AT OR BELOW PT'S PAIN GOAL Outcome: Progressing Pain level will be less than 4 on scale 0-10.   Problem: RH KNOWLEDGE DEFICIT Goal: RH STG INCREASE KNOWLEDGE OF HYPERTENSION Outcome: Progressing Patient and caregiver will be able to verbalize understanding of hypertension at discharge.

## 2011-04-24 NOTE — Progress Notes (Signed)
Patient tolerated therapy today . Pain in right foot and legs managed with ultram 50mg  at 0905 . Bilateral lower extremities edematous . Elevated on pillows in bed. Blood pressure 174/115 at 1530. Scheduled hydralazine 50mg  given per order. Rechecked blood pressure at 1613 for 168/98. D. Anguilli PA aware . Continue to monitor blood pressure report given to next shift LPN  To recheck blood pressure. Patient denies s/s elevated blood pressure.  Continue with plan of care.                                       Cleotilde Neer

## 2011-04-24 NOTE — Progress Notes (Signed)
Occupational Therapy Session Note  Patient Details  Name: Heather Sims MRN: [id number] Date of Birth: [birth year]  Today's Date: 04/24/2011 Time: 1100-1200 Time Calculation (min): 60 min  Precautions: Precautions Precautions: Fall Restrictions Weight Bearing Restrictions: No  Short Term Goals: OT Short Term Goal 1: Pt will tolerate standing at sink for 45 seconds to enable her to be more independent with LB adls. OT Short Term Goal 2: Pt will bathe with min assist. OT Short Term Goal 3: Pt will don underwear and pants with min assist. OT Short Term Goal 4: Pt will transfer to toilet with min assist. OT Short Term Goal 5: Pt will demonstrate improved static sitting balance of supervision to be able to bathe safely on tub bench.  Skilled Therapeutic Interventions/Progress Updates:    self care retraining at sink level (pt's choice); focus on transitional movements with transfers, sit to stand, standing balance with equal weight -bearing through feet, still needed min questioning cues for simple problem solving and sequencing through ADL, functional ambulation 2x with +2 (with pt's arms over therapists shoulder) pt with increased left LE advancement, upright trunk posture and activity tolerance. Pt continuing to show good progress.  Pain No c/o pain  Therapy/Group: Individual Therapy  [name] 04/24/2011, 12:15 PM

## 2011-04-24 NOTE — Progress Notes (Addendum)
Subjective/Complaints: Review of Systems  Constitutional: Positive for malaise/fatigue.  Neurological: Positive for tremors.  All other systems reviewed and are negative.  2/1--foot pain at times with activity  Objective Vital Signs: Blood pressure 160/82, pulse 95, temperature 99.2 F (37.3 C), temperature source Oral, resp. rate 20, weight 68.3 kg (150 lb 9.2 oz), last menstrual period 03/24/2011, SpO2 97.00%. US Abdomen Complete  04/23/2011  *RADIOLOGY REPORT*  Clinical Data:  Abdominal distension and pneumonia history of right renal biopsy 04/10/2011  COMPLETE ABDOMINAL ULTRASOUND  Comparison:  04/10/2011  Findings:  Gallbladder:  No gallstones, gallbladder wall thickening, or pericholecystic fluid.  Common bile duct:  Normal caliber, measured diameter is 3 mm.  Liver:  No focal lesion identified.  Within normal limits in parenchymal echogenicity.  IVC:  Appears normal.  Pancreas:  No focal abnormality seen.  Spleen:  Spleen length measures 10 cm.  Normal parenchymal echotexture.  Small left pleural effusion.  Right Kidney:  The right kidney measures 12.4 cm length. No hydronephrosis. The renal parenchyma is diffusely hyperechoic. Pain next heterogeneous echotexture lesion is demonstrated measuring 5.2 x 2.9 x 3.3 cm posterior and inferior to the lower pole of the right kidney.  Mild flow is demonstrated in the area on color flow Doppler imaging. This is in the area of recent biopsy as shown on the previous study and likely represents residual subcapsular hematoma.  Recommend to follow up in 2-4 weeks unless symptoms warrant earlier intervention.  Left Kidney:  The left kidney measures 12 point centimeters length. No hydronephrosis.  Abdominal aorta:  No aneurysm identified.  IMPRESSION: Heterogeneous mixed echotexture mass about the lower pole of the right kidney likely represents post biopsy hematoma.  Follow-up recommended.  Small left pleural effusion.  Original Report Authenticated By: Marlon Pel, M.D.   No results found for this basename: WBC:2,HGB:2,HCT:2,PLT:2 in the last 72 hours  Basename 04/23/11 0645  NA 142  K 5.5*  CL 102  CO2 27  GLUCOSE 89  BUN 46*  CREATININE 2.79*  CALCIUM 9.0   CBG (last 3)  No results found for this basename: GLUCAP:3 in the last 72 hours  Wt Readings from Last 3 Encounters:  04/22/11 68.3 kg (150 lb 9.2 oz)  04/10/11 69.536 kg (153 lb 4.8 oz)    Physical Exam:  General appearance: no distress and slowed mentation Head: Normocephalic, without obvious abnormality, atraumatic Eyes: conjunctivae/corneas clear. PERRL, EOM's intact. Fundi benign. Ears: normal TM's and external ear canals both ears Nose: Nares normal. Septum midline. Mucosa normal. No drainage or sinus tenderness. Throat: lips, mucosa, and tongue normal; teeth and gums normal Neck: no adenopathy, no carotid bruit, no JVD, supple, symmetrical, trachea midline and thyroid not enlarged, symmetric, no tenderness/mass/nodules Back: symmetric, no curvature. ROM normal. No CVA tenderness. Resp: clear to auscultation bilaterally Cardio: regular rate and rhythm, S1, S2 normal, no murmur, click, rub or gallop and normal apical impulse GI: soft, non-tender; bowel sounds normal; no masses,  no organomegaly Extremities:BLE trace to 1+ edema decreasing Pulses: 2+ and symmetric Skin: Skin color, texture, turgor normal. No rashes or lesions Neurologic: delayed processing and word finding deficits perhaps a little better.Much more alert today.   Oriented to name and hospital. Speech slurred and low volume.  UE's grossly 3 to 3+/5.  LE's 2+/5 prox to 3/5 distally (inconsistent).  Withdraws to pain on all 4's. DTR's 2-3+.  Follows all simple 1 step commands. Needs cueing for phonation and attention. Neither foot impressive for pain with rom  or palpation Incision/Wound: n/a  exam updated 2/1  Assessment/Plan: 1. Functional deficits secondary to anoxic brain injury which require 3+  hours per day of interdisciplinary therapy in a comprehensive inpatient rehab setting. Physiatrist is providing close team supervision and 24 hour management of active medical problems listed below. Physiatrist and rehab team continue to assess barriers to discharge/monitor patient progress toward functional and medical goals. Mobility: Bed Mobility Supine to Sit: 3: Mod assist Sitting - Scoot to Edge of Bed: 3: Mod assist Transfers Sit to Stand: 3: Mod assist Sit to Stand Details (indicate cue type and reason): pt can only tolerate standing for 20 seconds or less Ambulation/Gait Ambulation/Gait Assistance: Not tested (comment)   ADL:   Cognition: Cognition Overall Cognitive Status: Impaired Arousal/Alertness: Lethargic Orientation Level: Oriented X4 Attention: Sustained Focused Attention: Appears intact Sustained Attention: Impaired Sustained Attention Impairment: Verbal basic;Functional basic Memory: Impaired Memory Impairment: Decreased short term memory;Decreased recall of new information Decreased Short Term Memory: Functional basic;Verbal basic Awareness: Impaired Awareness Impairment: Emergent impairment;Other (comment) Problem Solving: Impaired Problem Solving Impairment: Verbal basic;Functional basic Executive Function: Sequencing;Organizing;Initiating;Self Monitoring;Self Correcting Reasoning: Impaired Reasoning Impairment: Verbal basic;Functional basic Sequencing: Impaired Sequencing Impairment: Functional basic Organizing: Impaired Organizing Impairment: Verbal basic;Functional basic Initiating: Impaired Initiating Impairment: Verbal basic;Functional basic Self Monitoring: Impaired Self Monitoring Impairment: Verbal basic;Functional basic Self Correcting: Impaired Self Correcting Impairment: Verbal basic;Functional basic Behaviors: Poor frustration tolerance Safety/Judgment: Impaired Comments: Pt with incerased insight into both physical and cognitive  deficits with decreased frustration tolerance and emotional response.  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  2. DVT Prophylaxis/Anticoagulation: SCDs.dopplers negative. 3. Seizure disorder. Valproate 1000 mg every 12 hours. Latest valproic acid level January 15 of 42.6. Monitor closely for any increased seizure activity. 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   4. Hypertension.  Norvasc increased to 10mg . clonidine patch 0.3 mg change weekly, hydralazine 25 mg 4 times daily, Lopressor 100 mg twice daily. Monitor closely with increased activity. BP's showing improvement despite this AM's reading. 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 Mgt per nephrology- potassium level too high.  -?small hematoma on kidney per u/s  -f/u bmet today. Kayexalate given yesterday 6. Nutrition: Continue to encourage intake. . Valproic acid should help with appetite as well.  -megace helping--will increase to bid 7. Cardiomyopathy--?hypertensive vs viral  -increased lasix to 80 tid  Elevate legs. -500cc yesterday  -recheck cxr shows improving pulmonary edema  -denies and SOB at present and is tolerating activities with therapy  -increased clonidine to patch to 0.2. Needs more consistent control. 8. Temp-   -avelox--d/c today  -bcx collected and negative.   -dopplers negative   9. Mood-discussed with patient and mother.  She denies depression and didn't express an interest in counseling  -will monitor for now and i asked mom/pt to let me know if things should change    10. Pain -encourage tylenol for pain at this point due to slowed mentation.   -can use low dose ultram if pain more severe  LOS (Days) 11 A FACE TO FACE EVALUATION WAS PERFORMED  SWARTZ,ZACHARY T 04/24/2011, 7:21 AM

## 2011-04-24 NOTE — Progress Notes (Signed)
Occupational Therapy Session Note  Patient Details  Name: Heather Sims MRN: [id number] Date of Birth: Feb 28, 1991  Today's Date: 04/24/2011 Time: 4098-1191 Time Calculation (min): 30 min  Precautions: Precautions Precautions: Fall Restrictions Weight Bearing Restrictions: No  Short Term Goals: OT Short Term Goal 1: Pt will tolerate standing at sink for 45 seconds to enable her to be more independent with LB adls. OT Short Term Goal 2: Pt will bathe with min assist. OT Short Term Goal 3: Pt will don underwear and pants with min assist. OT Short Term Goal 4: Pt will transfer to toilet with min assist. OT Short Term Goal 5: Pt will demonstrate improved static sitting balance of supervision to be able to bathe safely on tub bench.  Skilled Therapeutic Interventions/Progress Updates:    Pt engaged in standing activities with emphasis on weight shifts to right and left.  Initially, pt required BUE support but later in session pt able to perform task with only assist from therapist.  Pt initially anxious with activity but gained confidence throughout.  Pt returned to room and agreed to remain in chair with Mom in room.    Pain  pt denies pain     Therapy/Group: Individual Therapy  Rich Brave 04/24/2011, 2:57 PM

## 2011-04-24 NOTE — Progress Notes (Signed)
Physical Therapy Note  Patient Details  Name: Heather Sims MRN: [id number] Date of Birth: [birth year] Today's Date: 04/24/2011  Time: 1000-1057 57 minutes  C/o L LE pain with wt bearing, eased with rest.  RN aware.  Transfer training bed <> w/c, w/c <> toilet, and car transfers all with min A with manual and verbal cues for forward wt shift and foot placement for safe transfers.  Toileting with min A for standing balance, pt able to up/down pants.  Seated balance with reaching out of BOS for functional tasks with close supervision, pt with delayed balance reactions.  Gait with maxi-sky with RW with mod A, facilitation at hips for motor control and stability.  Closed chain standing exercises with focus on trunk and hip control in stance.  Pt required min-mod A for trunk control with wt shifts, mini squats.  Pt more animated today, initiating conversation and smiling more.  Individual therapy   [name] 04/24/2011, 12:53 PM

## 2011-04-24 NOTE — Progress Notes (Signed)
Subjective: Interval History: none.  Objective: Vital signs in last 24 hours:  Temp:  [99.2 F (37.3 C)-99.7 F (37.6 C)] 99.2 F (37.3 C) (02/01 0500) Pulse Rate:  [88-101] 95  (02/01 0500) Resp:  [20] 20  (02/01 0500) BP: (152-180)/(82-102) 160/82 mmHg (02/01 0500) SpO2:  [97 %] 97 % (02/01 0500)  Weight change:   Intake/Output: I/O last 3 completed shifts: In: 240 [P.O.:240] Out: 150 [Urine:150]   Intake/Output this shift:     Gen: In bed, eating breakfast.  CVS- RRR  RS- CTA  ABD- BS present soft, mild distention  EXT- tr le edema   Lab Results: No results found for this basename: WBC:3,HGB:3,HCT:3,PLT:3 in the last 72 hours BMET  [facility] 04/23/11 0645  NA 142  K 5.5*  CL 102  CO2 27  GLUCOSE 89  BUN 46*  CREATININE 2.79*  CALCIUM 9.0  PHOS 6.6*   LFT  Basename 04/23/11 0645  PROT --  ALBUMIN 2.0*  AST --  ALT --  ALKPHOS --  BILITOT --  BILIDIR --  IBILI --   PT/INR No results found for this basename: LABPROT:2,INR:2 in the last 72 hours Hepatitis Panel No results found for this basename: HEPBSAG,HCVAB,HEPAIGM,HEPBIGM in the last 72 hours  Studies/Results: US Abdomen Complete  04/23/2011  *RADIOLOGY REPORT*  Clinical Data:  Abdominal distension and pneumonia history of right renal biopsy 04/10/2011  COMPLETE ABDOMINAL ULTRASOUND  Comparison:  04/10/2011  Findings:  Gallbladder:  No gallstones, gallbladder wall thickening, or pericholecystic fluid.  Common bile duct:  Normal caliber, measured diameter is 3 mm.  Liver:  No focal lesion identified.  Within normal limits in parenchymal echogenicity.  IVC:  Appears normal.  Pancreas:  No focal abnormality seen.  Spleen:  Spleen length measures 10 cm.  Normal parenchymal echotexture.  Small left pleural effusion.  Right Kidney:  The right kidney measures 12.4 cm length. No hydronephrosis. The renal parenchyma is diffusely hyperechoic. Pain next heterogeneous echotexture lesion is demonstrated measuring  5.2 x 2.9 x 3.3 cm posterior and inferior to the lower pole of the right kidney.  Mild flow is demonstrated in the area on color flow Doppler imaging. This is in the area of recent biopsy as shown on the previous study and likely represents residual subcapsular hematoma.  Recommend to follow up in 2-4 weeks unless symptoms warrant earlier intervention.  Left Kidney:  The left kidney measures 12 point centimeters length. No hydronephrosis.  Abdominal aorta:  No aneurysm identified.  IMPRESSION: Heterogeneous mixed echotexture mass about the lower pole of the right kidney likely represents post biopsy hematoma.  Follow-up recommended.  Small left pleural effusion.  Original Report Authenticated By: Marlon Pel, M.D.    I have reviewed the patient's current medications.  Assessment/Plan: 1. AKI, proteinuria, low C3 -Cr up mildly yesterday, recheck in am. Continue to follow. 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 Possible C3 nephropathy, 2. Malignant HTN - better, on 4 BP meds Volume excess resolved and making good amounts of urine. Continue lasix. BP going back up, can consider increasing clonidine to 0.2mg . Aim for 130-150 SBP  3. S/P arrest with anoxic brain injury- per rehab  4. Anemia- on aranesp  5. Peri-renal hematoma, s/p biopsy, seen at time of bx by [place] per IR.Marland Kitchen No hematuria, asymptomatic. hgb stable. Cont aranesp  6. Fever with CXR findings- Afebrile . A not that suggestive of UTI,  ucx with no growth. 7 days of avelox, can d/c today.  7. Hyperkalemia: continue to follow  Awaiting biopsy report from [facility] otherwise stable   LOS: 11 Deema Juncaj W @TODAY @7 :54 AM

## 2011-04-24 NOTE — Progress Notes (Signed)
Speech Language Pathology Daily Session Note  Patient Details  Name: Heather Sims MRN: [id number] Date of Birth: 1991/02/20  Today's Date: 04/24/2011 Time: 1478-2956 Time Calculation (min): 45 min  Short Term Goals: 1. Pt will sustain attention to a functional task for 5 minutes with Mod verbal cues for redirection.  2. Pt will utilize external memory aids (schedule, calendar, etc.) to demonstrate recall of daily events with Min A verbal and question cues.  3. Pt will demonstrate functional problem solving with basic and familiar tasks with Mod verbal and visual cues.  4. Pt will demonstrate emergent awareness of cognitive deficits during functional and familiar tasks with Mod A verbal and question cues.    Skilled Therapeutic Interventions: Treatment focus on intellectual awareness and expression of wants/needs. Pt is demonstrating emergent awareness independently by asking RN questions about medications and side effects. Max A verbal and semantic cues to locate call bell to increase to locate and for utilization for expression of wants/needs and to increase overall safety. Pt located/chose items for lunch/dinner with Mod A visual cues and yes/no questions.  FIM:  Comprehension Comprehension: 3-Understands basic 50 - 74% of the time/requires cueing 25 - 50%  of the time Expression Expression: 4-Expresses basic 75 - 89% of the time/requires cueing 10 - 24% of the time. Needs helper to occlude trach/needs to repeat words. Social Interaction Social Interaction: 4-Interacts appropriately 75 - 89% of the time - Needs redirection for appropriate language or to initiate interaction. Problem Solving Problem Solving: 3-Solves basic 50 - 74% of the time/requires cueing 25 - 49% of the time Memory Memory: 3-Recognizes or recalls 50 - 74% of the time/requires cueing 25 - 49% of the time  Pain Pain Assessment Pain Assessment: No/denies pain  Therapy/Group: Individual Therapy  Heather Sims,  Ricki Clack 04/24/2011, 4:06 PM

## 2011-04-24 DEATH — deceased

## 2011-04-25 LAB — URINALYSIS, MICROSCOPIC ONLY
Bilirubin Urine: NEGATIVE
Glucose, UA: NEGATIVE mg/dL
Protein, ur: >300 mg/dL — AB
Urobilinogen, UA: 0.2 mg/dL (ref 0.0–1.0)

## 2011-04-25 LAB — RENAL FUNCTION PANEL
Albumin: 2 g/dL — ABNORMAL LOW (ref 3.5–5.2)
BUN: 42 mg/dL — ABNORMAL HIGH (ref 6–23)
Calcium: 8.5 mg/dL (ref 8.4–10.5)
Creatinine, Ser: 3.18 mg/dL — ABNORMAL HIGH (ref 0.50–1.10)
GFR calc non Af Amer: 20 mL/min — ABNORMAL LOW (ref >90)
Phosphorus: 7.1 mg/dL — ABNORMAL HIGH (ref 2.3–4.6)

## 2011-04-25 NOTE — Progress Notes (Signed)
Patient ID: Heather Sims, female   DOB: Aug 20, 1990, 21 y.o.   MRN: [id number] Subjective/Complaints: Review of Systems  Constitutional: Positive for malaise/fatigue.  Neurological: Positive for tremors.  All other systems reviewed and are negative.  2/2.  They're quite well without complaints. Very positive affect. Creatinine up slightly to 3.18. Exam unchanged. Your nose and throat unremarkable. Chest clear to auscultation. Cardiac-exam revealed normal S1-S2 without tachycardia. Abdomen soft flat nontender. Extremities no edema.  Objective Vital Signs: Blood pressure 168/95, pulse 103, temperature 99.8 F (37.7 C), temperature source Oral, resp. rate 18, weight 150 lb 9.2 oz (68.3 kg), last menstrual period 03/24/2011, SpO2 97.00%. US Abdomen Complete  04/23/2011  *RADIOLOGY REPORT*  Clinical Data:  Abdominal distension and pneumonia history of right renal biopsy 04/10/2011  COMPLETE ABDOMINAL ULTRASOUND  Comparison:  04/10/2011  Findings:  Gallbladder:  No gallstones, gallbladder wall thickening, or pericholecystic fluid.  Common bile duct:  Normal caliber, measured diameter is 3 mm.  Liver:  No focal lesion identified.  Within normal limits in parenchymal echogenicity.  IVC:  Appears normal.  Pancreas:  No focal abnormality seen.  Spleen:  Spleen length measures 10 cm.  Normal parenchymal echotexture.  Small left pleural effusion.  Right Kidney:  The right kidney measures 12.4 cm length. No hydronephrosis. The renal parenchyma is diffusely hyperechoic. Pain next heterogeneous echotexture lesion is demonstrated measuring 5.2 x 2.9 x 3.3 cm posterior and inferior to the lower pole of the right kidney.  Mild flow is demonstrated in the area on color flow Doppler imaging. This is in the area of recent biopsy as shown on the previous study and likely represents residual subcapsular hematoma.  Recommend to follow up in 2-4 weeks unless symptoms warrant earlier intervention.  Left Kidney:  The left kidney  measures 12 point centimeters length. No hydronephrosis.  Abdominal aorta:  No aneurysm identified.  IMPRESSION: Heterogeneous mixed echotexture mass about the lower pole of the right kidney likely represents post biopsy hematoma.  Follow-up recommended.  Small left pleural effusion.  Original Report Authenticated By: Marlon Pel, M.D.   No results found for this basename: WBC:2,HGB:2,HCT:2,PLT:2 in the last 72 hours  Basename 04/25/11 0545 04/24/11 0624  NA 141 143  K 4.2 5.0  CL 103 102  CO2 27 24  GLUCOSE 104* 90  BUN 42* 45*  CREATININE 3.18* 2.88*  CALCIUM 8.5 9.0   CBG (last 3)  No results found for this basename: GLUCAP:3 in the last 72 hours  Wt Readings from Last 3 Encounters:  04/22/11 150 lb 9.2 oz (68.3 kg)  04/10/11 153 lb 4.8 oz (69.536 kg)    Physical Exam:  General appearance: no distress and slowed mentation Head: Normocephalic, without obvious abnormality, atraumatic Eyes: conjunctivae/corneas clear. PERRL, EOM's intact. Fundi benign. Ears: normal TM's and external ear canals both ears Nose: Nares normal. Septum midline. Mucosa normal. No drainage or sinus tenderness. Throat: lips, mucosa, and tongue normal; teeth and gums normal Neck: no adenopathy, no carotid bruit, no JVD, supple, symmetrical, trachea midline and thyroid not enlarged, symmetric, no tenderness/mass/nodules Back: symmetric, no curvature. ROM normal. No CVA tenderness. Resp: clear to auscultation bilaterally Cardio: regular rate and rhythm, S1, S2 normal, no murmur, click, rub or gallop and normal apical impulse GI: soft, non-tender; bowel sounds normal; no masses,  no organomegaly Extremities:BLE trace to 1+ edema decreasing Pulses: 2+ and symmetric Skin: Skin color, texture, turgor normal. No rashes or lesions Neurologic: delayed processing and word finding deficits perhaps a little better.Much  more alert today.   Oriented to name and hospital. Speech slurred and low volume.  UE's  grossly 3 to 3+/5.  LE's 2+/5 prox to 3/5 distally (inconsistent).  Withdraws to pain on all 4's. DTR's 2-3+.  Follows all simple 1 step commands. Needs cueing for phonation and attention. Neither foot impressive for pain with rom or palpation Incision/Wound: n/a  exam updated 2/1  Assessment/Plan: 1. Functional deficits secondary to anoxic brain injury which require 3+ hours per day of interdisciplinary therapy in a comprehensive inpatient rehab setting. Physiatrist is providing close team supervision and 24 hour management of active medical problems listed below. Physiatrist and rehab team continue to assess barriers to discharge/monitor patient progress toward functional and medical goals. Mobility: Bed Mobility Supine to Sit: 3: Mod assist Sitting - Scoot to Edge of Bed: 3: Mod assist Transfers Sit to Stand: 3: Mod assist Sit to Stand Details (indicate cue type and reason): pt can only tolerate standing for 20 seconds or less Ambulation/Gait Ambulation/Gait Assistance: 1: +2 Total assist   ADL:   Cognition: Cognition Overall Cognitive Status: Impaired Arousal/Alertness: Lethargic Orientation Level: Oriented X4 Attention: Sustained Focused Attention: Appears intact Sustained Attention: Impaired Sustained Attention Impairment: Verbal basic;Functional basic Memory: Impaired Memory Impairment: Decreased short term memory;Decreased recall of new information Decreased Short Term Memory: Functional basic;Verbal basic Awareness: Impaired Awareness Impairment: Emergent impairment;Other (comment) Problem Solving: Impaired Problem Solving Impairment: Verbal basic;Functional basic Executive Function: Sequencing;Organizing;Initiating;Self Monitoring;Self Correcting Reasoning: Impaired Reasoning Impairment: Verbal basic;Functional basic Sequencing: Impaired Sequencing Impairment: Functional basic Organizing: Impaired Organizing Impairment: Verbal basic;Functional basic Initiating:  Impaired Initiating Impairment: Verbal basic;Functional basic Self Monitoring: Impaired Self Monitoring Impairment: Verbal basic;Functional basic Self Correcting: Impaired Self Correcting Impairment: Verbal basic;Functional basic Behaviors: Poor frustration tolerance Safety/Judgment: Impaired Comments: Pt with incerased insight into both physical and cognitive deficits with decreased frustration tolerance and emotional response.  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  2. DVT Prophylaxis/Anticoagulation: SCDs.dopplers negative. 3. Seizure disorder. Valproate 1000 mg every 12 hours. Latest valproic acid level January 15 of 42.6. Monitor closely for any increased seizure activity. 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   4. Hypertension.  Norvasc increased to 10mg . clonidine patch 0.3 mg change weekly, hydralazine 25 mg 4 times daily, Lopressor 100 mg twice daily. Monitor closely with increased activity. BP's showing improvement despite this AM's reading. 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 Mgt per nephrology- potassium level too high.  -?small hematoma on kidney per u/s  -f/u bmet today. Kayexalate given yesterday 6. Nutrition: Continue to encourage intake. . Valproic acid should help with appetite as well.  -megace helping--will increase to bid 7. Cardiomyopathy--?hypertensive vs viral  -increased lasix to 80 tid  Elevate legs. -500cc yesterday  -recheck cxr shows improving pulmonary edema  -denies and SOB at present and is tolerating activities with therapy  -increased clonidine to patch to 0.2. Needs more consistent control. 8. Temp-   -avelox--d/c today  -bcx collected and negative.   -dopplers negative   9. Mood-discussed with patient and mother.  She denies depression and didn't express an interest in  counseling  -will monitor for now and i asked mom/pt to let me know if things should change    10. Pain -encourage tylenol for pain at this point due to slowed mentation.   -can use low dose ultram if pain more severe  LOS (Days) 12 A FACE TO FACE  EVALUATION WAS PERFORMED  Rogelia Boga 04/25/2011, 9:24 AM

## 2011-04-25 NOTE — Progress Notes (Signed)
Physical Therapy Note  Patient Details  Name: Heather Sims MRN: [id number] Date of Birth: 1991/01/24 Today's Date: 04/25/2011 Time: 0830-0930 (60')  Pain noted at feet bilaterally ranging from 2 to 4/10 with weight bearing  Therapeutic Exercise (15') B LE stretching and supine exercises Therapeutic Activity (45') Bed Mobility S/Mod-I, Transfer training supine<->sit with S/Mod-I                                             Transfers sit<->stand with Mod-A and weight shifting and gait inside room                                             Transfer w/c<->toilet with Mod-A handheld with patient having difficulty with advancing feet                                                    during stand-pivot  Individual Therapy Session   [name] 04/25/2011, 8:35 AM

## 2011-04-26 LAB — RENAL FUNCTION PANEL
Albumin: 2.1 g/dL — ABNORMAL LOW (ref 3.5–5.2)
CO2: 26 mEq/L (ref 19–32)
Calcium: 8.9 mg/dL (ref 8.4–10.5)
Chloride: 101 mEq/L (ref 96–112)
Creatinine, Ser: 3.38 mg/dL — ABNORMAL HIGH (ref 0.50–1.10)
GFR calc Af Amer: 21 mL/min — ABNORMAL LOW (ref >90)
GFR calc non Af Amer: 18 mL/min — ABNORMAL LOW (ref >90)
Sodium: 142 mEq/L (ref 135–145)

## 2011-04-26 MED ORDER — HYDROCODONE-ACETAMINOPHEN 5-325 MG PO TABS
1.0000 | ORAL_TABLET | Freq: Four times a day (QID) | ORAL | Status: DC | PRN
  Administered 2011-04-27 – 2011-04-28 (×3): 1 via ORAL
  Filled 2011-04-26 (×5): qty 1

## 2011-04-26 NOTE — Progress Notes (Deleted)
Occupational Therapy Session Note  Patient Details  Name: Heather Sims MRN: [id number] Date of Birth: January 04, 1991  Today's Date: 04/26/2011 Time: 1115-1230 Time Calculation (min): 75 min  Precautions: Precautions Precautions: Fall Restrictions Weight Bearing Restrictions: No  Short Term Goals: OT Short Term Goal 1: Pt will tolerate standing at sink for 45 seconds to enable her to be more independent with LB adls. OT Short Term Goal 2: Pt will bathe with min assist. OT Short Term Goal 3: Pt will don underwear and pants with min assist. OT Short Term Goal 4: Pt will transfer to toilet with min assist. OT Short Term Goal 5: Pt will demonstrate improved static sitting balance of supervision to be able to bathe safely on tub bench.  Skilled Therapeutic Interventions/Progress Updates: toiletied and then completed ADL in w/c at sink with focus on cognition to complete self care and transfers.  Pt with LE tremor type movments and grimaced and moaned with pain each time they occurred.  Patient transfers Min to Max A and dependent on her c/o LE pain/discomfort and tremor type movements.   Mom and friend present and both slept during session.     Vital Signs Therapy Vitals Temp: 99.6 F (37.6 C) Temp src: Oral Pulse Rate: 89  Resp: 19  BP: 159/98 mmHg Patient Position, if appropriate: Sitting Oxygen Therapy SpO2: 96 % O2 Device: None (Room air) Pain 10/10 RN addressed and gave meds as applicable   ADL Eating: Supervision/safety;Set up Where Assessed-Eating: Bed level Grooming: Supervision/safety;Setup Where Assessed-Grooming: Wheelchair Upper Body Bathing: Minimal cueing Where Assessed-Upper Body Bathing: Wheelchair Lower Body Bathing: Moderate assistance Where Assessed-Lower Body Bathing: Wheelchair Upper Body Dressing: Setup;Minimal cueing;Supervision/safety Where Assessed-Upper Body Dressing: Wheelchair Lower Body Dressing: Dependent Where Assessed-Lower Body Dressing:  Wheelchair;Standing at sink (standing tolerance less than 20 seconds) Toileting: Dependent Where Assessed-Toileting: Teacher, adult education: Moderate assistance Toilet Transfer Method: Stand pivot Toilet Transfer Equipment: Grab bars;Bedside commode (BSC over toilet) Tub/Shower Transfer: Not assessed Walk-In Shower Transfer: Not assessed  Therapy/Group: Individual Therapy  Bud Face [facility] 04/26/2011, 4:10 PM

## 2011-04-26 NOTE — Progress Notes (Signed)
Subjective: Interval History: none.  Objective: Vital signs in last 24 hours:  Temp:  [98.3 F (36.8 C)-99.6 F (37.6 C)] 99.6 F (37.6 C) (02/03 1506) Pulse Rate:  [89-96] 89  (02/03 1506) Resp:  [16-19] 19  (02/03 1506) BP: (146-171)/(88-98) 159/98 mmHg (02/03 1506) SpO2:  [96 %-97 %] 96 % (02/03 1506)  Weight change:   Intake/Output: I/O last 3 completed shifts: In: 720 [P.O.:720] Out: -    Intake/Output this shift:     Gen: no distress, calm CVS- RRR  RS- CTA  ABD- BS present soft, mild distention  EXT- NO edema throughout   Lab Results: No results found for this basename: WBC:3,HGB:3,HCT:3,PLT:3 in the last 72 hours BMET  [facility] 04/26/11 0810 04/25/11 0545 04/24/11 0624  NA 142 141 143  K 4.2 4.2 5.0  CL 101 103 102  CO2 26 27 24   GLUCOSE 108* 104* 90  BUN 44* 42* 45*  CREATININE 3.38* 3.18* 2.88*  CALCIUM 8.9 8.5 9.0  PHOS 6.6* 7.1* 7.1*   LFT  Basename 04/26/11 0810  PROT --  ALBUMIN 2.1*  AST --  ALT --  ALKPHOS --  BILITOT --  BILIDIR --  IBILI --   PT/INR No results found for this basename: LABPROT:2,INR:2 in the last 72 hours Hepatitis Panel No results found for this basename: HEPBSAG,HCVAB,HEPAIGM,HEPBIGM in the last 72 hours  Studies/Results: No results found.  I have reviewed the patient's current medications.  Assessment/Plan: 1. 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  Creatinine gradually worsening daily on tid po lasix 80 mg. No volume on exam.  Will stop diuretics, this may be cause of rise.   2. Malignant HTN - better, on 4 BP meds Volume excess resolved and making good amounts of urine. 3. S/P arrest with anoxic brain injury- per rehab  4. Anemia- on aranesp  5. Peri-renal hematoma, s/p biopsy, seen at time of bx by [place] per IR.Marland Kitchen No hematuria, asymptomatic. hgb  stable. Cont aranesp  6. Fever with CXR findings- Afebrile . A not that suggestive of UTI, ucx with no growth. 7 days of avelox, can d/c   LOS: 13 Caila Cirelli D @TODAY @6 :14 PM

## 2011-04-26 NOTE — Progress Notes (Signed)
Occupational Therapy Session Note  Patient Details  Name: Heather Sims MRN: [id number] Date of Birth: 31-Jul-1990  Today's Date: 04/26/2011 Time: 1115-1230 Time Calculation (min): 75 min  Precautions: Precautions Precautions: Fall Restrictions Weight Bearing Restrictions: No  Short Term Goals: OT Short Term Goal 1: Pt will tolerate standing at sink for 45 seconds to enable her to be more independent with LB adls. OT Short Term Goal 2: Pt will bathe with min assist. OT Short Term Goal 3: Pt will don underwear and pants with min assist. OT Short Term Goal 4: Pt will transfer to toilet with min assist. OT Short Term Goal 5: Pt will demonstrate improved static sitting balance of supervision to be able to bathe safely on tub bench.  Skilled Therapeutic Interventions/Progress Updates: toiletied and then completed ADL in w/c at sink with focus on cognition to complete self care and transfers.  Pt with LE tremor type movments and grimaced and moaned with pain each time they occurred.  Patient transfers Min to Max A and dependent on her c/o LE pain/discomfort and tremor type movements.   Mom and friend present and both slept during session. Therapy/Group: Individual Therapy  Rozelle Logan 04/26/2011, 5:17 PM

## 2011-04-26 NOTE — Progress Notes (Signed)
Patient ID: Heather Sims, female   DOB: 1990-07-13, 21 y.o.   MRN: [id number] Patient ID: Mily Malecki, female   DOB: [birth year], 21 y.o.   MRN: [id number] Subjective/Complaints: Review of Systems  Constitutional: Positive for malaise/fatigue.  Neurological: Positive for tremors.  All other systems reviewed and are negative.  2/3.  Feels well without complaints. Has intermittent low-grade fever. Denies any URI symptoms. Denies any dysuria;  urinalysis ordered yesterday and did reveal some pyuria. However appears to be a poor specimen with  too numerous to count RBCs proteinuria and squamous cells noted. Mother at bedside.  Creatinine continues to rise.  We'll check urine culture Examination: ENT unremarkable. Oropharynx without erythema;  neck no cervical adenopathy. Chest clear to auscultation. Cardiac-exam revealed normal S1-S2 without tachycardia. Abdomen soft flat nontender. Extremities no edema.  Objective Vital Signs: Blood pressure 171/92, pulse 96, temperature 98.3 F (36.8 C), temperature source Axillary, resp. rate 16, weight 150 lb 9.2 oz (68.3 kg), last menstrual period 03/24/2011, SpO2 97.00%. No results found. No results found for this basename: WBC:2,HGB:2,HCT:2,PLT:2 in the last 72 hours  Basename 04/26/11 0810 04/25/11 0545  NA 142 141  K 4.2 4.2  CL 101 103  CO2 26 27  GLUCOSE 108* 104*  BUN 44* 42*  CREATININE 3.38* 3.18*  CALCIUM 8.9 8.5   CBG (last 3)  No results found for this basename: GLUCAP:3 in the last 72 hours  Wt Readings from Last 3 Encounters:  04/22/11 150 lb 9.2 oz (68.3 kg)  04/10/11 153 lb 4.8 oz (69.536 kg)    Physical Exam:  General appearance: no distress and slowed mentation Head: Normocephalic, without obvious abnormality, atraumatic Eyes: conjunctivae/corneas clear. PERRL, EOM's intact. Fundi benign. Ears: normal TM's and external ear canals both ears Nose: Nares normal. Septum midline. Mucosa normal. No drainage or sinus  tenderness. Throat: lips, mucosa, and tongue normal; teeth and gums normal Neck: no adenopathy, no carotid bruit, no JVD, supple, symmetrical, trachea midline and thyroid not enlarged, symmetric, no tenderness/mass/nodules Back: symmetric, no curvature. ROM normal. No CVA tenderness. Resp: clear to auscultation bilaterally Cardio: regular rate and rhythm, S1, S2 normal, no murmur, click, rub or gallop and normal apical impulse GI: soft, non-tender; bowel sounds normal; no masses,  no organomegaly Extremities:BLE trace to 1+ edema decreasing Pulses: 2+ and symmetric Skin: Skin color, texture, turgor normal. No rashes or lesions Neurologic: delayed processing and word finding deficits perhaps a little better.Much more alert today.   Oriented to name and hospital. Speech slurred and low volume.  UE's grossly 3 to 3+/5.  LE's 2+/5 prox to 3/5 distally (inconsistent).  Withdraws to pain on all 4's. DTR's 2-3+.  Follows all simple 1 step commands. Needs cueing for phonation and attention. Neither foot impressive for pain with rom or palpation Incision/Wound: n/a  exam updated 2/1  Assessment/Plan: 1. Functional deficits secondary to anoxic brain injury which require 3+ hours per day of interdisciplinary therapy in a comprehensive inpatient rehab setting. Physiatrist is providing close team supervision and 24 hour management of active medical problems listed below. Physiatrist and rehab team continue to assess barriers to discharge/monitor patient progress toward functional and medical goals. Mobility: Bed Mobility Supine to Sit: 3: Mod assist Sitting - Scoot to Edge of Bed: 3: Mod assist Transfers Sit to Stand: 3: Mod assist Sit to Stand Details (indicate cue type and reason): pt can only tolerate standing for 20 seconds or less Ambulation/Gait Ambulation/Gait Assistance: 1: +2 Total assist   ADL:   Cognition:  Cognition Overall Cognitive Status: Impaired Arousal/Alertness:  Lethargic Orientation Level: Oriented to person;Oriented to place (orientation fluctuates) Attention: Sustained Focused Attention: Appears intact Sustained Attention: Impaired Sustained Attention Impairment: Verbal basic;Functional basic Memory: Impaired Memory Impairment: Decreased short term memory;Decreased recall of new information Decreased Short Term Memory: Functional basic;Verbal basic Awareness: Impaired Awareness Impairment: Emergent impairment;Other (comment) Problem Solving: Impaired Problem Solving Impairment: Verbal basic;Functional basic Executive Function: Sequencing;Organizing;Initiating;Self Monitoring;Self Correcting Reasoning: Impaired Reasoning Impairment: Verbal basic;Functional basic Sequencing: Impaired Sequencing Impairment: Functional basic Organizing: Impaired Organizing Impairment: Verbal basic;Functional basic Initiating: Impaired Initiating Impairment: Verbal basic;Functional basic Self Monitoring: Impaired Self Monitoring Impairment: Verbal basic;Functional basic Self Correcting: Impaired Self Correcting Impairment: Verbal basic;Functional basic Behaviors: Poor frustration tolerance Safety/Judgment: Impaired Comments: Pt with incerased insight into both physical and cognitive deficits with decreased frustration tolerance and emotional response.  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  2. DVT Prophylaxis/Anticoagulation: SCDs.dopplers negative. 3. Seizure disorder. Valproate 1000 mg every 12 hours. Latest valproic acid level January 15 of 42.6. Monitor closely for any increased seizure activity. 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   4. Hypertension.  Norvasc increased to 10mg . clonidine patch 0.3 mg change weekly, hydralazine 25 mg 4 times daily, Lopressor 100 mg  twice daily. Monitor closely with increased activity. BP's showing improvement despite this AM's reading. 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 Mgt per nephrology- potassium level too high.  -?small hematoma on kidney per u/s  -f/u bmet today. Kayexalate given yesterday 6. Nutrition: Continue to encourage intake. . Valproic acid should help with appetite as well.  -megace helping--will increase to bid 7. Cardiomyopathy--?hypertensive vs viral  -increased lasix to 80 tid  Elevate legs. -500cc yesterday  -recheck cxr shows improving pulmonary edema  -denies and SOB at present and is tolerating activities with therapy  -increased clonidine to patch to 0.2. Needs more consistent control. 8. Temp-   -avelox--d/c today  -bcx collected and negative.   -dopplers negative   9. Mood-discussed with patient and mother.  She denies depression and didn't express an interest in counseling  -will monitor for now and i asked mom/pt to let me know if things should change    10. Pain -encourage tylenol for pain at this point due to slowed mentation.   -can use low dose ultram if pain more severe  LOS (Days) 13 A FACE TO FACE EVALUATION WAS PERFORMED  Rogelia Boga 04/26/2011, 9:16 AM

## 2011-04-27 LAB — RENAL FUNCTION PANEL
CO2: 27 mEq/L (ref 19–32)
Calcium: 9 mg/dL (ref 8.4–10.5)
Chloride: 101 mEq/L (ref 96–112)
GFR calc Af Amer: 20 mL/min — ABNORMAL LOW (ref >90)
GFR calc non Af Amer: 18 mL/min — ABNORMAL LOW (ref >90)
Glucose, Bld: 92 mg/dL (ref 70–99)
Sodium: 140 mEq/L (ref 135–145)

## 2011-04-27 MED ORDER — BOOST / RESOURCE BREEZE PO LIQD
1.0000 | Freq: Every day | ORAL | Status: DC
  Administered 2011-04-28: 1 via ORAL

## 2011-04-27 MED ORDER — GABAPENTIN 100 MG PO CAPS
100.0000 mg | ORAL_CAPSULE | Freq: Every day | ORAL | Status: DC
  Administered 2011-04-27: 100 mg via ORAL
  Filled 2011-04-27 (×2): qty 1

## 2011-04-27 MED ORDER — CLONIDINE HCL 0.3 MG/24HR TD PTWK
0.3000 mg | MEDICATED_PATCH | TRANSDERMAL | Status: DC
  Administered 2011-04-28: 0.3 mg via TRANSDERMAL
  Filled 2011-04-27: qty 1

## 2011-04-27 NOTE — Progress Notes (Signed)
INITIAL ADULT NUTRITION ASSESSMENT Date: 04/27/2011   Time: 3:07 PM  Reason for Assessment: Poor PO intake  ASSESSMENT: Female 21 y.o.  Dx: Anoxic brain injury  Hx:  Past Medical History  Diagnosis Date  . Pneumonia last 2 weeks    'walking pneumonia'   Related Meds:     . acetaminophen  650 mg Oral Once  . amLODipine  10 mg Oral Daily  . clonazePAM  0.5 mg Oral Daily  . cloNIDine  0.3 mg Transdermal Weekly  . darbepoetin (ARANESP) injection - NON-DIALYSIS  100 mcg Subcutaneous Q Thu-1800  . divalproex  1,000 mg Oral Q12H  . famotidine  20 mg Oral Daily  . feeding supplement  237 mL Oral BID  . ferrous sulfate  325 mg Oral TID WC  . gabapentin  100 mg Oral Q2000  . hydrALAZINE  50 mg Oral QID  . megestrol  400 mg Oral BID  . metoprolol tartrate  100 mg Oral BID  . senna  1 tablet Oral BID  . DISCONTD: cloNIDine  0.2 mg Transdermal Weekly  . DISCONTD: furosemide  80 mg Oral TID   Ht:  5\' 2"  (157.5 cm)  Wt: 122 lb (55.339 kg) (RN NOTIFIED, ONLY BOTTOM SHEET, TOPS, PAD, BLANKET, 1 PILLOW)  Ideal Wt:    50 kg % Ideal Wt: 111%  Usual Wt: 135 lb  (61.4 kg) per family prior to hospitalization % Usual Wt: 90%  BMI is 22.3 - weight is WNL.  Food/Nutrition Related Hx: Regular diet PTA  Labs:  CMP     Component Value Date/Time   NA 140 04/27/2011 0700   K 4.2 04/27/2011 0700   CL 101 04/27/2011 0700   CO2 27 04/27/2011 0700   GLUCOSE 92 04/27/2011 0700   BUN 45* 04/27/2011 0700   CREATININE 3.51* 04/27/2011 0700   CALCIUM 9.0 04/27/2011 0700   PROT 5.2* 04/13/2011 2025   ALBUMIN 2.2* 04/27/2011 0700   AST 26 04/13/2011 2025   ALT 12 04/13/2011 2025   ALKPHOS 50 04/13/2011 2025   BILITOT 0.1* 04/13/2011 2025   GFRNONAA 18* 04/27/2011 0700   GFRAA 20* 04/27/2011 0700  Phosphorus 6.2H  Intake/Output: I/O last 3 completed shifts: In: 960 [P.O.:960] Out: -  Total I/O In: 480 [P.O.:480] Out: -    Diet Order: General  Supplements/Tube Feeding: Ensure Clinical Strength PO  BID  IVF:    Estimated Nutritional Needs:   Kcal: 1650 - 1925 kcal Protein:  55 - 66 grams Fluid:  1.6 - 1.9 L/d  RD drawn to chart 2/2 poor PO intake. Father reports intake fluctuates and pt has "good days and bad days."  Currently ordered for Ensure Clinical Strength PO BID. Noted pt on megace for appetite. Pt with renal biopsy. 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 Pt with 10% wt loss x 1 month; meets criteria for moderate malnutrition in the context of acute illness 2/2 this weight loss and likely <75% of estimated energy requirement for >7 days.   NUTRITION DIAGNOSIS: -Inadequate oral intake (NI-2.1).  Status: Ongoing  RELATED TO: fluctuating appetite  AS EVIDENCE BY: family report and documented variable PO intake  MONITORING/EVALUATION(Goals): Goal: PO intake to meet >/= 90% of kcal and protein needs. Monitor: PO intake, weights, labs, I/O's  EDUCATION NEEDS: -Education not appropriate at this time  INTERVENTION: 1. RD to address any education needs closer to d/c 2. Agreeable to [employer] daily for addition protein and kcal 3. RD to follow nutrition care  plan   Dietitian #: [phone]  DOCUMENTATION CODES Per approved criteria  -Severe malnutrition in the context of acute illness or injury    Adair Laundry 04/27/2011, 3:07 PM

## 2011-04-27 NOTE — Progress Notes (Addendum)
Subjective: Awake, father at bedside  Objective: Vital signs in last 24 hours: Blood pressure 168/112, pulse 96, temperature 99.5 F (37.5 C), temperature source Oral, resp. rate 20, weight 55.339 kg (122 lb), last menstrual period 03/24/2011, SpO2 97.00%.   Intake/Output from previous day: 02/03 0701 - 02/04 0700 In: 960 [P.O.:960] Out: -  Intake/Output this shift: Total I/O In: 480 [P.O.:480] Out: -  Wt Readings from Last 3 Encounters:  04/27/11 55.339 kg (122 lb)  04/10/11 69.536 kg (153 lb 4.8 oz)     PHYSICAL EXAM General--awake, responds to questions, but slowly Chest--clear Heart--no rub Abd--nontender Extr--no edema  Lab Results:   Lab 04/27/11 0700 04/26/11 0810 04/25/11 0545  NA 140 142 141  K 4.2 4.2 4.2  CL 101 101 103  CO2 27 26 27   BUN 45* 44* 42*  CREATININE 3.51* 3.38* 3.18*  EGFR -- -- --  GLUCOSE 92 -- --  CALCIUM 9.0 8.9 8.5  PHOS 6.2* 6.6* 7.1*    RENAL BIOPSY--MPGN with C3 nephritis  Assessment/Plan: 1. 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 Prelim results on biopsy--MPGN with C3 nephropathy. Creatinine gradually worsening daily on tid po lasix 80 mg. No volume on exam. Will stop diuretics, this may be cause of rise. I left message with Dr. Bonnell Public who's an expert in   Glomerular diseases at [facility] to find out whether to treat (options are listed as steroids or monoclonal antibodies such as rituxan) or just to try to control BP  2. Malignant HTN - Still high on 4 BP meds Volume excess resolved and making good amounts of urine. Hesitant to begin ACE inhib with Cr rising every day.  Off lasix now 3. S/P arrest with anoxic brain injury- per rehab  4. Anemia- on aranesp  5. Peri-renal hematoma, s/p biopsy, seen at time of bx by [place] per IR.Marland Kitchen No hematuria, asymptomatic. hgb stable. Cont aranesp  6. Fever with CXR findings- Afebrile . not that suggestive of UTI, ucx with no growth. 7 days  of avelox, can d/c   CBC, Fe/TIBC/ferritin, PTH with AM lab Also check Hep C     LOS: 14 days   Jourdyn Hasler F 04/27/2011,3:02 PM

## 2011-04-27 NOTE — Progress Notes (Signed)
Subjective/Complaints: Review of Systems  Constitutional: Positive for malaise/fatigue.  Neurological: Positive for tremors.  All other systems reviewed and are negative.  2/4-foot pain still, especially at night left greater than right. Objective Vital Signs: Blood pressure 171/109, pulse 95, temperature 99.6 F (37.6 C), temperature source Oral, resp. rate 16, weight 55.339 kg (122 lb), last menstrual period 03/24/2011, SpO2 97.00%. No results found. No results found for this basename: WBC:2,HGB:2,HCT:2,PLT:2 in the last 72 hours  Basename 04/26/11 0810 04/25/11 0545  NA 142 141  K 4.2 4.2  CL 101 103  CO2 26 27  GLUCOSE 108* 104*  BUN 44* 42*  CREATININE 3.38* 3.18*  CALCIUM 8.9 8.5   CBG (last 3)  No results found for this basename: GLUCAP:3 in the last 72 hours  Wt Readings from Last 3 Encounters:  04/27/11 55.339 kg (122 lb)  04/10/11 69.536 kg (153 lb 4.8 oz)    Physical Exam:  General appearance: no distress and slowed mentation Head: Normocephalic, without obvious abnormality, atraumatic Eyes: conjunctivae/corneas clear. PERRL, EOM's intact. Fundi benign. Ears: normal TM's and external ear canals both ears Nose: Nares normal. Septum midline. Mucosa normal. No drainage or sinus tenderness. Throat: lips, mucosa, and tongue normal; teeth and gums normal Neck: no adenopathy, no carotid bruit, no JVD, supple, symmetrical, trachea midline and thyroid not enlarged, symmetric, no tenderness/mass/nodules Back: symmetric, no curvature. ROM normal. No CVA tenderness. Resp: clear to auscultation bilaterally Cardio: regular rate and rhythm, S1, S2 normal, no murmur, click, rub or gallop and normal apical impulse GI: soft, non-tender; bowel sounds normal; no masses,  no organomegaly Extremities- edema trace to absent. Pulses: 2+ and symmetric Skin: Skin color, texture, turgor normal. No rashes or lesions Neurologic: delayed processing and word finding deficits perhaps a  little better.Much more alert today.   Oriented to name and hospital. Speech slurred and low volume.  UE's grossly 3 to 3+/5.  LE's 2+/5 prox to 3/5 distally (inconsistent).  Withdraws to pain on all 4's. DTR's 2-3+.  Follows all simple 1 step commands. Needs cueing for phonation and attention. Feet are a little tender to touch.  No spasms or tone seen. Incision/Wound: n/a  exam updated 2/4  Assessment/Plan: 1. Functional deficits secondary to anoxic brain injury which require 3+ hours per day of interdisciplinary therapy in a comprehensive inpatient rehab setting. Physiatrist is providing close team supervision and 24 hour management of active medical problems listed below. Physiatrist and rehab team continue to assess barriers to discharge/monitor patient progress toward functional and medical goals. Mobility: Bed Mobility Supine to Sit: 3: Mod assist Sitting - Scoot to Edge of Bed: 3: Mod assist Transfers Sit to Stand: 3: Mod assist Sit to Stand Details (indicate cue type and reason): pt can only tolerate standing for 20 seconds or less Ambulation/Gait Ambulation/Gait Assistance: 1: +2 Total assist   ADL:   Cognition: Cognition Overall Cognitive Status: Impaired Arousal/Alertness: Lethargic Orientation Level: Oriented to person;Oriented to place (orientation fluctuates) Attention: Sustained Focused Attention: Appears intact Sustained Attention: Impaired Sustained Attention Impairment: Verbal basic;Functional basic Memory: Impaired Memory Impairment: Decreased short term memory;Decreased recall of new information Decreased Short Term Memory: Functional basic;Verbal basic Awareness: Impaired Awareness Impairment: Emergent impairment;Other (comment) Problem Solving: Impaired Problem Solving Impairment: Verbal basic;Functional basic Executive Function: Sequencing;Organizing;Initiating;Self Monitoring;Self Correcting Reasoning: Impaired Reasoning Impairment: Verbal  basic;Functional basic Sequencing: Impaired Sequencing Impairment: Functional basic Organizing: Impaired Organizing Impairment: Verbal basic;Functional basic Initiating: Impaired Initiating Impairment: Verbal basic;Functional basic Self Monitoring: Impaired Self Monitoring Impairment: Verbal basic;Functional basic  Self Correcting: Impaired Self Correcting Impairment: Verbal basic;Functional basic Behaviors: Poor frustration tolerance Safety/Judgment: Impaired Comments: Pt with incerased insight into both physical and cognitive deficits with decreased frustration tolerance and emotional response.  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  2. DVT Prophylaxis/Anticoagulation: SCDs.dopplers negative. 3. Seizure disorder. Valproate 1000 mg every 12 hours. Latest valproic acid level January 15 of 42.6. Monitor closely for any increased seizure activity.  4. Hypertension.  Norvasc increased to 10mg . clonidine patch 0.3 mg change weekly, hydralazine 25 mg 4 times daily, Lopressor 100 mg twice daily. Monitor closely with increased activity. BP's showing improvement despite this AM's reading. 5 Renal failure/GN. Felt to be secondary to malignant hypertension and GN  -?small hematoma on kidney per u/s  -increased CR likely due to lasix at least in part- lasix stopped 6. Nutrition: Continue to encourage intake. . Valproic acid should help with appetite as well.  -megace helping--will increase to bid 7. Cardiomyopathy--?hypertensive vs viral  -recheck cxr shows improving pulmonary edema  -denies and SOB at present and is tolerating activities with therapy  -increase clonidine to patch to 0.3 Needs more consistent control. 8. Temp-   -urine culture pending  -only low grade at present  9. Mood-discussed with patient and mother.  She denies depression and didn't  express an interest in counseling  -will monitor for now and i asked mom/pt to let me know if things should change    10. Pain -encourage tylenol for pain at this point due to slowed mentation.   -can use low dose ultram if pain more severe\  -add evening neurontin  LOS (Days) 14 A FACE TO FACE EVALUATION WAS PERFORMED  Eliaz Fout T 04/27/2011, 8:02 AM

## 2011-04-27 NOTE — Progress Notes (Signed)
Physical Therapy Note  Patient Details  Name: Ashaki Frosch MRN: [id number] Date of Birth: June 26, 1990 Today's Date: 04/27/2011  Time: 1300-1330 30 minutes  Pt c/o LE pain with wt bearing, eases with rest.  Treatment focus on gait training with RW short household distances 15'-20' with mod A.  Pt with increased difficulty controlling RW and safely performing turns to sit with RW.  Multiple attempts turning, pt limited by anxiety and fatigue with gait training.  Pt more animated this session, increased spontaneous conversation this pm.  Individual therapy   DONAWERTH,KAREN 04/27/2011, 3:12 PM

## 2011-04-27 NOTE — Progress Notes (Signed)
Speech Language Pathology Daily Session Note  Patient Details  Name: Ahriana Gunkel MRN: [id number] Date of Birth: 11/22/1990  Today's Date: 04/27/2011 Time: 9629-5284 Time Calculation (min): 45 min   Skilled Therapeutic Interventions: Treatment focus on functional problem solving. Max verbal, visual and questioning cues needed for functional problem solving and organization with sorting and counting change and for a complex sorting task with matching cards by shape, color, or number.  Pt became emotional during session and reported the task was confusing and hard but was able to complete the task intermittently without cueing.    Daily Session FIM:  Comprehension Comprehension: 4-Understands basic 75 - 89% of the time/requires cueing 10 - 24% of the time Expression Expression Mode: Verbal Expression: 3-Expresses basic 50 - 74% of the time/requires cueing 25 - 50% of the time. Needs to repeat parts of sentences. Social Interaction Social Interaction: 3-Interacts appropriately 50 - 74% of the time - May be physically or verbally inappropriate. Problem Solving Problem Solving: 2-Solves basic 25 - 49% of the time - needs direction more than half the time to initiate, plan or complete simple activities Memory Memory: 2-Recognizes or recalls 25 - 49% of the time/requires cueing 51 - 75% of the time FIM - Eating Eating Activity: 5: Set-up assist for apply device (including dentures) Pain Pain Assessment Pain Assessment: No/denies pain Cognition:   Oral/Motor: Oral Motor/Sensory Function Overall Oral Motor/Sensory Function: Appears within functional limits for tasks assessed Motor Speech Overall Motor Speech: Appears within functional limits for tasks assessed Comprehension: Auditory Comprehension Overall Auditory Comprehension: Impaired Yes/No Questions: Within Functional Limits Commands: Impaired Multistep Basic Commands: Other (comment) (due to decreasd attention and  processing) Conversation: Simple Interfering Components: Attention;Processing speed EffectiveTechniques: Extra processing time;Repetition Visual Recognition/Discrimination Discrimination: Within Function Limits Reading Comprehension Reading Status: Within funtional limits Expression: Expression Primary Mode of Expression: Verbal Verbal Expression Overall Verbal Expression: Appears within functional limits for tasks assessed Initiation: Impaired Level of Generative/Spontaneous Verbalization: Sentence Repetition: No impairment Naming: No impairment Pragmatics: Impairment Impairments: Abnormal affect Interfering Components: Attention;Speech intelligibility Effective Techniques:  (increased vocal intensity and over articulation) Written Expression Dominant Hand: Right Written Expression: Not tested  Therapy/Group: Individual Therapy  Mckaela Howley 04/27/2011, 3:08 PM

## 2011-04-27 NOTE — Progress Notes (Signed)
Patient blood pressure remains elevated . Blood pressure at 1528 170/100. D. Anguilli PA aware.prior to  hydralazine 50mg   Given .  Patient complained of bilateral feet burning ultram 50mg  po given with mild relief . Mild tremors persist in upper extremities and lower extremities. catapres patch intact. Continue with plan of care.  Heather Sims

## 2011-04-27 NOTE — Progress Notes (Signed)
Occupational Therapy Session Note  Patient Details  Name: Tynisa Vohs MRN: [id number] Date of Birth: 1991/01/14  Today's Date: 04/27/2011 Time: 0730-0830 Time Calculation (min): 60 min  Precautions: Precautions Precautions: Fall Restrictions Weight Bearing Restrictions: No  Short Term Goals: OT Short Term Goal 1: Pt will tolerate standing at sink for 45 seconds to enable her to be more independent with LB adls. OT Short Term Goal 2: Pt will bathe with min assist. OT Short Term Goal 3: Pt will don underwear and pants with min assist. OT Short Term Goal 4: Pt will transfer to toilet with min assist. OT Short Term Goal 5: Pt will demonstrate improved static sitting balance of supervision to be able to bathe safely on tub bench.  Skilled Therapeutic Interventions/Progress Updates:    self care retraining at shower level down in ADL apartment. Focus on stand step transfers, standing tolerance for longer periods of time, sit to stand, short distance functional ambulation without device with mod A, longer distance functional ambulation with +2 three musketeers with min A. Pt with increased ability to weight shift and advance each leg at a functional pace. Pt needed max cuing in shower for initiation and follow through with washing 10/10 parts- but performed dressing with more ease requiring less cuing.     Pain  c/o feet pain bilaterally especially with weightbearing and light touch, MD aware.   Therapy/Group: Individual Therapy  [name] 04/27/2011, 11:22 AM

## 2011-04-27 NOTE — Progress Notes (Signed)
Physical Therapy Note  Patient Details  Name: Heather Sims MRN: [id number] Date of Birth: 05/05/1990 Today's Date: 04/27/2011  Time: 4098-1191 57 minutes  Pt c/o LE pain with standing, eased with rest.  Squat pivot transfers with min A continuing with focus on pt getting trunk fwd, assist for LE placement.  Gait training short distances to simulate entering bathroom at home.  Initial attempt with HHA, pt limited by anxiety, unable to gait with +1 HHA.  Gait with RW with +1 assist, mod A multiple attempts 31' with pt easing anxiety with multiple repetitions.  Pt requires mod-max A for turns with RW due to increased myclonic movements, decreased strength in LEs to step backwards to assist with turn.  Standing balance/tolerance with min A with 1 UE support for sorting, selective attention task, with min A for attention to task and for problem solving.  Pt expresses happiness at being able to complete task and increase standing tolerance.  Gait into bathroom and bathroom mobility and transfers with mod A.  Pt able to handle hygiene and pants up/down with min -mod A for balance.  Overall pt with decreased anxiety, increased activity tolerance by end of session.  Individual thearpy   Dabid Godown 04/27/2011, 3:25 PM

## 2011-04-27 NOTE — Progress Notes (Signed)
Patient Details  Name: Heather Sims MRN: [id number] Date of Birth: 1990/07/22  Today's Date: 04/27/2011 Time: 915-930  Skilled Therapeutic Interventions/Progress Updates: sat in recliner to play table-top card game working on sustained attention, problem solving, sorting with no cues-max cues.  Pt became emotional during session stating that the game was confusing, but able to complete tasks with cuing.  No c/o pain.  Therapy/Group: Other: co-treat with ST  Activity Level: Simple:    Heather Sims 04/27/2011, 3:25 PM

## 2011-04-28 LAB — RENAL FUNCTION PANEL
Albumin: 2.1 g/dL — ABNORMAL LOW (ref 3.5–5.2)
CO2: 27 mEq/L (ref 19–32)
Chloride: 103 mEq/L (ref 96–112)
GFR calc Af Amer: 19 mL/min — ABNORMAL LOW (ref >90)
GFR calc non Af Amer: 16 mL/min — ABNORMAL LOW (ref >90)
Potassium: 4.5 mEq/L (ref 3.5–5.1)
Sodium: 141 mEq/L (ref 135–145)

## 2011-04-28 LAB — HEPATITIS C ANTIBODY (REFLEX): HCV Ab: NEGATIVE

## 2011-04-28 LAB — URINE CULTURE: Culture  Setup Time: 201302040315

## 2011-04-28 MED ORDER — METOPROLOL TARTRATE 25 MG PO TABS
125.0000 mg | ORAL_TABLET | Freq: Two times a day (BID) | ORAL | Status: DC
  Filled 2011-04-28 (×2): qty 1

## 2011-04-28 NOTE — Progress Notes (Addendum)
Patient Details  Name: Mariavictoria Nottingham MRN: [id number] Date of Birth: 27-Apr-1990  Today's Date: 04/28/2011 Time: 9-930 Skilled Therapeutic Interventions/Progress Updates: Out and about on hospital grounds w/c level focusing on  utilization of strategies to increase working memory(writing things down,making lists)  Utilized a list to increase recall of 5 items pt needed to locate in the gift shop with Mod cues. Pt's mother present throughout session and educated on strategies. Pt needed frequent verbal redirection throughout task (Mod A) due to a moderately distracting environment and fatigue. No c/o pain to LRT/CTRS  Therapy/Group: Other: co-treat with ST  Activity Level: Moderate:  Level of assist: Mod Assist  12-1028 TR session cancelled to allow PT to complete Family Education with Mother.     Doral Ventrella 04/28/2011, 12:18 PM

## 2011-04-28 NOTE — Progress Notes (Signed)
Patient ID: Heather Sims, female   DOB: 07-Mar-1991, 21 y.o.   MRN: [id number] Subjective/Complaints: Review of Systems  Constitutional: Positive for malaise/fatigue.  Neurological: Positive for tremors.  All other systems reviewed and are negative.  2/5--slept fairly well.  She says feet feel about the same, but felt that she walked a little better yesterday Objective Vital Signs: Blood pressure 148/107, pulse 96, temperature 99 F (37.2 C), temperature source Axillary, resp. rate 19, weight 56.79 kg (125 lb 3.2 oz), last menstrual period 03/24/2011, SpO2 95.00%. No results found. No results found for this basename: WBC:2,HGB:2,HCT:2,PLT:2 in the last 72 hours  Basename 04/27/11 0700 04/26/11 0810  NA 140 142  K 4.2 4.2  CL 101 101  CO2 27 26  GLUCOSE 92 108*  BUN 45* 44*  CREATININE 3.51* 3.38*  CALCIUM 9.0 8.9   CBG (last 3)  No results found for this basename: GLUCAP:3 in the last 72 hours  Wt Readings from Last 3 Encounters:  04/28/11 56.79 kg (125 lb 3.2 oz)  04/10/11 69.536 kg (153 lb 4.8 oz)    Physical Exam:  General appearance: no distress and slowed mentation Head: Normocephalic, without obvious abnormality, atraumatic Eyes: conjunctivae/corneas clear. PERRL, EOM's intact. Fundi benign. Ears: normal TM's and external ear canals both ears Nose: Nares normal. Septum midline. Mucosa normal. No drainage or sinus tenderness. Throat: lips, mucosa, and tongue normal; teeth and gums normal Neck: no adenopathy, no carotid bruit, no JVD, supple, symmetrical, trachea midline and thyroid not enlarged, symmetric, no tenderness/mass/nodules Back: symmetric, no curvature. ROM normal. No CVA tenderness. Resp: clear to auscultation bilaterally Cardio: regular rate and rhythm, S1, S2 normal, no murmur, click, rub or gallop and normal apical impulse GI: soft, non-tender; bowel sounds normal; no masses,  no organomegaly Extremities- edema trace to absent. Pulses: 2+ and  symmetric Skin: Skin color, texture, turgor normal. No rashes or lesions Neurologic: more alert and faster to process..   Oriented to name and hospital. Hypophonic speech still.  UE's grossly 3 to 3+/5.  LE's 2+/5 prox to 3/5 distally (inconsistent).  Withdraws to pain on all 4's. DTR's 2-3+.  Follows all simple 1 step commands. Needs cueing for phonation and attention. Feet are less tender to touch. Pt with myoclonus of left more than right LE's. Incision/Wound: n/a  exam updated 2/5  Assessment/Plan: 1. Functional deficits secondary to anoxic brain injury which require 3+ hours per day of interdisciplinary therapy in a comprehensive inpatient rehab setting. Physiatrist is providing close team supervision and 24 hour management of active medical problems listed below. Physiatrist and rehab team continue to assess barriers to discharge/monitor patient progress toward functional and medical goals. Mobility: Bed Mobility Supine to Sit: 3: Mod assist Sitting - Scoot to Edge of Bed: 3: Mod assist Transfers Sit to Stand: 3: Mod assist Sit to Stand Details (indicate cue type and reason): pt can only tolerate standing for 20 seconds or less Ambulation/Gait Ambulation/Gait Assistance: 1: +2 Total assist   ADL:   Cognition: Cognition Overall Cognitive Status: Impaired Arousal/Alertness: Lethargic Orientation Level: Oriented to person;Oriented to place (orientation fluctuates) Attention: Sustained Focused Attention: Appears intact Sustained Attention: Impaired Sustained Attention Impairment: Verbal basic;Functional basic Memory: Impaired Memory Impairment: Decreased short term memory;Decreased recall of new information Decreased Short Term Memory: Functional basic;Verbal basic Awareness: Impaired Awareness Impairment: Emergent impairment;Other (comment) Problem Solving: Impaired Problem Solving Impairment: Verbal basic;Functional basic Executive Function:  Sequencing;Organizing;Initiating;Self Monitoring;Self Correcting Reasoning: Impaired Reasoning Impairment: Verbal basic;Functional basic Sequencing: Impaired Sequencing Impairment: Functional basic Organizing:  Impaired Organizing Impairment: Verbal basic;Functional basic Initiating: Impaired Initiating Impairment: Verbal basic;Functional basic Self Monitoring: Impaired Self Monitoring Impairment: Verbal basic;Functional basic Self Correcting: Impaired Self Correcting Impairment: Verbal basic;Functional basic Behaviors: Poor frustration tolerance Safety/Judgment: Impaired Comments: Pt with incerased insight into both physical and cognitive deficits with decreased frustration tolerance and emotional response.  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  2. DVT Prophylaxis/Anticoagulation: SCDs.dopplers negative. 3. Seizure disorder. Valproate 1000 mg every 12 hours. Latest valproic acid level January 15 of 42.6. Monitor closely for any increased seizure activity.  4. Hypertension.  Norvasc increased to 10mg . clonidine patch 0.3 mg change weekly, hydralazine 25 mg 4 times daily, Lopressor 100 mg twice daily. Monitor closely with increased activity. BP's showing improvement despite this AM's reading.  -P remains difficult to control despite adjustments.  The other factor here is financial as patient will not be able to obtain BP meds if they are costly. 5 Renal failure/GN. 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 Nutrition: Continue to encourage intake. . Valproic acid should help with appetite as well.  -megace helping--will increase to bid 7. Cardiomyopathy--?hypertensive vs viral  -recheck cxr shows improving pulmonary  edema  -denies and SOB at present and is tolerating activities with therapy  -increase clonidine to patch to 0.3 Needs more consistent control. 8. Temp-   -urine culture negative  -still low grade temp  -no respiratory sx. Off abx for ? Pneumonia. May be worth while checking another cxr.  Encourage IS  9. Mood-discussed with patient and mother.  She denies depression and didn't express an interest in counseling  -will monitor for now and i asked mom/pt to let me know if things should change    10. Pain -encourage tylenol for pain at this point due to slowed mentation.   -can use low dose ultram if pain more severe\  -added evening neurontin  LOS (Days) 15 A FACE TO FACE EVALUATION WAS PERFORMED  SWARTZ,ZACHARY T 04/28/2011, 7:25 AM

## 2011-04-28 NOTE — Progress Notes (Signed)
Speech Language Pathology Daily Session Note  Patient Details  Name: Salimatou Simone MRN: [id number] Date of Birth: 1990/09/05  Today's Date: 04/28/2011 Time: 4098-1191 Time Calculation (min): 45 min   Skilled Therapeutic Interventions: Treatment focus on utilization of strategies to increase working memory. Utilized note writing to increase recall of 5 items pt needed to locate in the gift shop. Mod semantic and questioning cues needed to utilize list to increase recall of items that need to be located and items that have already been found. Pt's mother present throughout session and educated on strategies. Pt needed frequent verbal redirection throughout task (Mod A) due to a moderately distracting environment and fatigue.    Daily Session FIM:  Comprehension Comprehension: 4-Understands basic 75 - 89% of the time/requires cueing 10 - 24% of the time Expression Expression: 4-Expresses basic 75 - 89% of the time/requires cueing 10 - 24% of the time. Needs helper to occlude trach/needs to repeat words. Social Interaction Social Interaction: 4-Interacts appropriately 75 - 89% of the time - Needs redirection for appropriate language or to initiate interaction. Problem Solving Problem Solving: 3-Solves basic 50 - 74% of the time/requires cueing 25 - 49% of the time Memory Memory: 3-Recognizes or recalls 50 - 74% of the time/requires cueing 25 - 49% of the time Pain Pain Assessment Pain Assessment: 0-10 Pain Score:   4 Pain Type: Chronic pain Pain Location: Foot Pain Orientation: Right;Left Pain Descriptors: Burning Pain Onset: On-going Pain Intervention(s): Medication (See eMAR);RN made aware Multiple Pain Sites: No  Therapy/Group: Individual Therapy  Toniyah Dilmore 04/28/2011, 10:37 AM

## 2011-04-28 NOTE — Progress Notes (Addendum)
Subjective: Awake, in bathroom with Physical therapist.  Mother at bedside  Objective: Vital signs in last 24 hours: Blood pressure 148/107, pulse 96, temperature 99 Sims (37.2 C), temperature source Axillary, resp. rate 19, weight 56.79 kg (125 lb 3.2 oz), last menstrual period 03/24/2011, SpO2 95.00%.   Intake/Output from previous day: 02/04 0701 - 02/05 0700 In: 480 [P.O.:480] Out: -  Intake/Output this shift:   Wt Readings from Last 3 Encounters:  04/28/11 56.79 kg (125 lb 3.2 oz)  04/10/11 69.536 kg (153 lb 4.8 oz)     PHYSICAL EXAM General--awake, still a little slow mentally Chest--clear Heart--no rub Abd--nontender Extr--no edema  Lab Results:   Lab 04/28/11 0645 04/27/11 0700 04/26/11 0810  NA 141 140 142  K 4.5 4.2 4.2  CL 103 101 101  CO2 27 27 26   BUN 43* 45* 44*  CREATININE 3.73* 3.51* 3.38*  EGFR -- -- --  GLUCOSE 102* -- --  CALCIUM 8.9 9.0 8.9  PHOS 6.0* 6.2* 6.6*     No results found for this basename: WBC:2,HGB:2,HCT:2,PLT:2 in the last 72 hours  No results found for this basename: IRON,TIBC,FERRITIN in the last 72 hours   No results found for this basename: PTH in the last 72 hours   Scheduled:   . acetaminophen  650 mg Oral Once  . amLODipine  10 mg Oral Daily  . clonazePAM  0.5 mg Oral Daily  . cloNIDine  0.3 mg Transdermal Weekly  . darbepoetin (ARANESP) injection - NON-DIALYSIS  100 mcg Subcutaneous Q Thu-1800  . divalproex  1,000 mg Oral Q12H  . famotidine  20 mg Oral Daily  . feeding supplement  237 mL Oral BID  . feeding supplement  1 Container Oral Daily  . ferrous sulfate  325 mg Oral TID WC  . gabapentin  100 mg Oral Q2000  . hydrALAZINE  50 mg Oral QID  . megestrol  400 mg Oral BID  . metoprolol tartrate  100 mg Oral BID  . senna  1 tablet Oral BID    Assessment/Plan:   1. 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 Creatinine gradually worsening. Lasix d/c'd 3 days ago. No volume excess on exam.. I lspoke with Dr. Bonnell Public who's an expert in Glomerular diseases at [facility]--he is trying to make arrangements for transfer to [facility]. C4 18 (10-40), C3 35 (90-180).  Cryo neg, Hep C -neg, Hep B s Ag neg, Hep B c IgM neg, HIV neg, ANA neg, ASO 87. 2. Malignant HTN - Still high on 4 BP meds. Volume excess resolved and making good amounts of urine. Hesitant to begin ACE inhib with Cr rising every day. Off lasix now.  Pulse 96 on metoprolol 100 BID.  Will increase metoprolol to 125 BID.   Still hesitant to add ACE inhib with Cr Rising each day  3. S/P arrest with anoxic brain injury- per rehab-Dr. Fortino Sic says she's making good progress 4. Anemia- on aranesp  5. Peri-renal hematoma, s/p biopsy, seen at time of bx by [place] per IR.Marland Kitchen No hematuria, asymptomatic. hgb stable. Cont aranesp   Currently afebrile off antibiotics.  Last CXR was 28 Jan   LOS: 15 days   Heather Sims 04/28/2011,8:21 AM

## 2011-04-28 NOTE — Progress Notes (Signed)
Physical Therapy Note  Patient Details  Name: Heather Sims MRN: [id number] Date of Birth: 08-Nov-1990 Today's Date: 04/28/2011  15:00-15:15 individual therapy pt denied pain. Gait training with rw 20' with min assist to go forward and mod assist to turn and max assist to back to chair. Pt with sizzoring and decreased control.   Julian Reil 04/28/2011, 3:17 PM

## 2011-04-28 NOTE — Plan of Care (Signed)
Problem: RH BOWEL ELIMINATION Goal: RH STG MANAGE BOWEL WITH ASSISTANCE Min assist with medication.  Outcome: Not Met (add Reason) Patient transferred to [facility] hospital. Goal: RH STG MANAGE BOWEL W/MEDICATION W/ASSISTANCE STG Manage Bowel with Medication with Assistance.  Outcome: Not Met (add Reason) Patient transferred to [facility] hospital .  Goal: RH STG MANAGE BOWEL W/EQUIPMENT W/ASSISTANCE STG Manage Bowel With Equipment With Assistance  Outcome: Not Met (add Reason) Patient transferred to [facility]  Goal: RH OTHER STG BOWEL ELIMINATION GOALS W/ASSIST Other STG Bowel Elimination Goals With Assistance.  Outcome: Not Met (add Reason) Patient transferred to [facility] hospital .  Problem: RH BLADDER ELIMINATION Goal: RH STG MANAGE BLADDER WITH ASSISTANCE STG Manage Bladder With Assistance  Outcome: Not Met (add Reason) Patient transferred to [facility] hospital  Problem: RH SAFETY Goal: RH STG ADHERE TO SAFETY PRECAUTIONS W/ASSISTANCE/DEVICE Min assist with squat pivot transfer 2 step transfers.  Outcome: Not Met (add Reason) Patient transferred to [facility] hospital  Goal: RH STG DECREASED RISK OF FALL WITH ASSISTANCE Min assist  Outcome: Not Met (add Reason) Patient transferred to [facility] hospital  Goal: RH OTHER STG SAFETY GOALS W/ASSIST Transfer 2 step squat pivot 2 assist .  Outcome: Not Met (add Reason) Patient transferred to [facility] .   Problem: RH COGNITION-NURSING Goal: RH STG USES MEMORY AIDS/STRATEGIES W/ASSIST TO PROBLEM SOLVE STG Uses Memory Aids/Strategies With Assistance to Problem Solve.  Outcome: Not Met (add Reason) Transferred to [facility].

## 2011-04-28 NOTE — Progress Notes (Signed)
Occupational Therapy Session Note  Patient Details  Name: Heather Sims MRN: [id number] Date of Birth: October 02, 1990  Today's Date: 04/28/2011 Time: 0730-0830 Time Calculation (min): 60 min  Precautions: Precautions Precautions: Fall Restrictions Weight Bearing Restrictions: No  Short Term Goals: OT Short Term Goal 1: Pt will tolerate standing at sink for 45 seconds to enable her to be more independent with LB adls. OT Short Term Goal 2: Pt will bathe with min assist. OT Short Term Goal 3: Pt will don underwear and pants with min assist. OT Short Term Goal 4: Pt will transfer to toilet with min assist. OT Short Term Goal 5: Pt will demonstrate improved static sitting balance of supervision to be able to bathe safely on tub bench.  Skilled Therapeutic Interventions/Progress Updates:    1:1 self care retraining at shower level: focus functional ambulation with RW for transfers, access to bathroom, sit to stand, standing balance without UE support, activity tolerance, simple problem solving, task organization/ sequencing with max cuing, frustration tolerance with self, etc. Pt continues to make gains in mobility/ ADL  however still "gets confused" At times with familiar tasks (mobility and basic ADL) requiring mod A to problem solve with extra time- continued education with her mother on cognition with functional and basic task and how to promote simple problem solving without giving her the answers.   Pain Bilateral feet pain- RN made aware   Therapy/Group: Individual Therapy  [name] 04/28/2011, 12:49 PM

## 2011-04-28 NOTE — Progress Notes (Signed)
Physical Therapy Note  Patient Details  Name: Heather Sims MRN: [id number] Date of Birth: [birth year] Today's Date: 04/28/2011  Time: [phone] 55 minutes  Pt c/o foot pain with wt bearing, eases with rest.  Session focus on family ed with pt's mother.  Bumping up stairs in w/c with +2 assist, mother needs further education.  Transfers bed<>w/c pt/mother able to perform at min A level.  Gait training with RW short distances with pt/mother able to perform at mod A level.  Attempt up/down curb step with +2 assit, pt 50% with increased anxiety with new task.  Pt/mother advised not to attempt curb step at home.  Pt improving with gait and activity tolerance, expresses comfort in having mother assist with gait.  Individual therapy   DONAWERTH,KAREN 04/28/2011, 12:13 PM

## 2011-04-28 NOTE — Discharge Summary (Signed)
NAMEJAXON, Heather Sims NO.:  [id number]  MEDICAL RECORD NO.:  [id number]  LOCATION:  4025                         FACILITY:  [facility]  PHYSICIAN:  Ranelle Oyster, M.D.DATE OF BIRTH:  09-Nov-1990  DATE OF ADMISSION:  04/13/2011 DATE OF DISCHARGE:  04/28/2011                              DISCHARGE SUMMARY   Discharge anticipated April 28, 2011, to [facility].  DISCHARGE DIAGNOSES: 1. Anoxic brain injury after cardiac arrest. 2. Acute renal failure secondary to malignant hypertension and     glomerular disease. 3. Sequential compression devices for deep vein thrombosis     prophylaxis. 4. Seizure disorder. 5. Hypertension. 6. Pain management. This is a 21 year old right-handed black female with recent diagnosis of hypertension as well as upper respiratory infection, which she had been placed on Avelox.  Admitted March 26, 2011, with sudden onset of shortness of breath and cardiorespiratory failure with CPR in the field. She was pulseless for approximately 5 minutes and intubated in the emergency department.  Underwent hypothermic protocol and suffered seizures upon rewarming.  Cranial CT scan and MRI showed no acute changes.  Echocardiogram with ejection fraction 40%, systolic function moderately reduced.  TEE with diffuse hypokinesis, ejection fraction 45%.  Venous Doppler studies, lower extremities were negative.  EEG with minimal seizure activity.  MRI of cervical, thoracic, lumbar spine unremarkable.  Noted hemoglobin 8.1, creatinine 2.31 upon admission. She was transfused 3 units of packed red blood cells.  Renal ultrasound was negative for hydronephrosis.  Nephrology consult felt renal insufficiency due to hypertension.  Renal biopsy completed April 10, 2011, to complete workup of acute renal insufficiency with results pending.  Postoperative biopsy with perirenal hematoma and close monitoring with CBC that remained stable.  Latest creatinine  1.95, felt to be stabilized.  High dose Lasix had been discontinued with stabilizing renal function January 20 and monitored closely.  The patient had been on subcutaneous heparin for deep vein thrombosis prophylaxis discontinued April 09, 2011, with renal biopsy completed April 10, 2011.  Placed on valproate 1000 mg every 12 hours per Neurology Services for seizure disorder.  No further seizure activity had been reported.  The patient continued to have bouts of myoclonus of the lower extremities, improved after being placed on Klonopin.  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  The patient was admitted for comprehensive rehab program.  PAST MEDICAL HISTORY:  Unremarkable except recent diagnosis of hypertension.  ALLERGIES:  None.  SOCIAL HISTORY:  Lives with family, 1 level home, 2 steps to entry. Functional history prior to admission was independent.  She is a Consulting civil engineer.  She does drive.  Functional status upon admission to rehab services was moderate assist to roll in the bed, max assist supine to sit, total assist for sit to stand, ambulation was not tested.  PHYSICAL EXAMINATION:  VITAL SIGNS:  Blood pressure 146/98, pulse 96, respirations 19, temperature 99. NEUROLOGIC:  This was an alert female.  She was oriented to person and date of birth.  She could tell the name of the hospital with cues.  She could recall the name of the hospital with time.  She stated she was  here because of problems with her memory.  She was very slow in processing simple commands.  Limited awareness and insight with a very flat affect.  Myoclonic jerks noted occasionally, but decreased from previous exams.  Deep tendon reflexes 3+, right greater than left.  REHABILITATION HOSPITAL COURSE:  The patient was admitted to inpatient rehab services with therapies initiated on a 3-hour daily basis consisting of physical therapy, occupational  therapy, speech therapy, and rehabilitation nursing.  The following issues were addressed during the patient's rehabilitation stay.  Pertaining to [name]'s anoxic brain injury after cardiac arrest remained stable.  She was followed closely from a cardiac standpoint.  Sequential compression devices were in place for deep vein thrombosis prophylaxis.  Recent venous Doppler studies negative.  Noted seizure disorder, maintained on valproate and monitored closely with no further seizure activity noted.  Acute renal failure, followed closely by the Renal Services with recent renal biopsy for ongoing workup of acute renal failure.  Felt renal failure secondary to malignant hypertension and glomerular disease.  Preliminary results on biopsy - MPGN with C3 nephropathy.  Her creatinine gradually continued to worsen.  Lasix recently discontinued on April 25, 2011. No volume excess on exam.  Her followup per Renal Services, spoke with Dr. Bonnell Public at [facility], who was an expert in the field on glomerular disease, it was felt by Renal Services at [facility] that the patient should be transferred to [facility] for ongoing workup.  Her blood pressures continued to be monitored on Norvasc 10 mg daily, Catapres patch 0.3 mg weekly, hydralazine 50 mg 4 times daily, Lopressor 125 mg twice daily and monitored closely.  Her latest renal panel on April 28, 2011, showed a BUN 43, creatinine 3.73, showing generous improvement from 1 week ago of 2.80 due to these changes and gradual worsening of renal failure prompted issues to transfer patient to [facility] for ongoing care.  The patient received weekly collaborative interdisciplinary team conferences during her rehab stay with mobility variable.  She continued to show impaired cognition and processing.  She was continent most of the time with routine toileting, requiring moderate to max assist for activities of living, moderate assist for overall  mobility, max assist for her safety and awareness.  DISCHARGE MEDICATIONS:  At time of dictation included: 1. Norvasc 10 mg p.o. daily. 2. Klonopin 0.5 mg p.o. daily. 3. Clonidine patch 0.3 mg weekly. 4. Aranesp 100 mcg every Thursday. 5. Depakote 1000 mg every 12 hours. 6. Pepcid 20 mg p.o. daily. 7. Ferrous sulfate 325 mg 3 times daily. 8. Neurontin 100 mg daily. 9. Hydralazine 50 mg 4 times daily. 10.Norco 1 tablet every 6 hours as needed pain. 11.Lopressor 125 mg p.o. b.i.d.  DIET:  Regular diet, however, they were considering renal restrictions.  SPECIAL INSTRUCTIONS:  Plan to transfer to [facility] for ongoing observation and care associated with acute renal failure, which was arranged by Dr. Marina Gravel of Renal Services at [facility] after discussing with Dr. Bonnell Public at [facility].  The patient's condition at time of transfer was guarded.     Mariam Dollar, P.A.   ______________________________ Ranelle Oyster, M.D.    DA/MEDQ  D:  04/28/2011  T:  04/28/2011  Job:  [id number]

## 2011-04-28 NOTE — Progress Notes (Signed)
Patient tolerated therapy today . Pain in bilateral feet treated with vicodin  1 and good relief of pain . Mild tremors noted to lower extremities.  Mod assist stand pivot  2+ assist . Patient discharged to [facility] at 1800 via care link. Report given to RN at [facility] at 1800.                                                             Cleotilde Neer

## 2011-04-28 NOTE — Patient Care Conference (Signed)
Inpatient RehabilitationTeam Conference Note Date: 04/28/2011   Time: 6:16 PM    Patient Name: Heather Sims      Medical Record Number: [id number]  Date of Birth: [birth year] Sex: Female         Room/Bed: 4025/4025-01 Payor Info: Payor:     Admitting Diagnosis: Anoxic BI, deconditioned  Admit Date/Time:  04/13/2011  5:00 PM Admission Comments: No comment available   Primary Diagnosis:  Anoxic brain injury Principal Problem: Anoxic brain injury  Patient Active Problem List  Diagnoses Date Noted  . Anoxic brain injury 04/14/2011  . Hypertension 04/07/2011  . ? Viral Cardiomyopathy 04/07/2011  . AKI (acute kidney injury) 03/26/2011  . Acute heart failure 03/26/2011  . Hypoxemia 03/26/2011  . Pneumonia 03/26/2011  . Anemia 03/26/2011    Expected Discharge Date: Expected Discharge Date: 04/28/11  Team Members Present: Physician: Dr. Faith Rogue Case Manager Present: Melanee Spry, RN Social Worker Present: Amada Jupiter, LCSW Nurse Present: Carmie End, RN PT Present: Edson Snowball, PT OT Present: Mackie Pai, OT;Ardis Rowan, COTA;Jennifer Smith, OT SLP Present: Feliberto Gottron, SLP Other (Discipline and Name): Tora Duck, PPS Coordinator RN Present: Carmie End    Current Status/Progress Goal Weekly Team Focus  Medical   renal function worsening, due to GN and diuresis  stabilize renal function  transfer to [facility] for nephrology work up and plan   Bowel/Bladder   continent bowel and bladder some urgency at hs   remain continenent   up to bathroom for toileting   Swallow/Nutrition/ Hydration             ADL's    min A overall  min A overall  family education with mom and dad   Mobility   min-mod A  min A  family ed, gait training   Communication   Min A  Min A  Family Edu   Safety/Cognition/ Behavioral Observations  Mod A  Min A  family Edu   Pain   bilateral feet pain "burning "   keep pain level <3  monitor effectiveness of pain meds   Skin   n/a              *See Interdisciplinary Assessment and Plan and progress notes for long and short-term goals  Barriers to Discharge: medical issues    Possible Resolutions to Barriers:  medical work up and rx    Discharge Planning/Teaching Needs:  transfer to [facility]- today? possible return to CIR? then home with parents      Team Discussion: Working on family ed w/ parents.  D/C plan from CIR has changed, however, to pt going to [facility] to see specialist about her renal issues.     Revisions to Treatment Plan: none    Continued Need for Acute Rehabilitation Level of Care: The patient requires daily medical management by a physician with specialized training in physical medicine and rehabilitation for the following conditions: Daily direction of a multidisciplinary physical rehabilitation program to ensure safe treatment while eliciting the highest outcome that is of practical value to the patient.: Yes Daily medical management of patient stability for increased activity during participation in an intensive rehabilitation regime.: Yes Daily analysis of laboratory values and/or radiology reports with any subsequent need for medication adjustment of medical intervention for : Neurological problems;Other  Brock Ra 04/28/2011, 6:16 PM

## 2011-04-29 NOTE — Progress Notes (Signed)
Therapeutic Recreation Discharge Summary Patient Details  Name: Heather Sims MRN: [id number] Date of Birth: 02-Nov-1990  Long term goals set: 1  Long term goals met: 0  Comments on progress toward goals: Pt has made good progress toward goal, but did not meet supervision level consistently.  Pt is limited my decreased cognition evidenced by decreased attention, decreased problem solving and decreased memory during TR tasks requiring no cues to maximum cuing to complete tasks.  Pt has an improved awareness of deficits and becomes emotional at times during challenging tasks.  Pt was transferred to [facility] for renal management.  Reasons goals not met: Community reintegration goal made n/a due to focus on leisure tasks and family education.  See above for further details.  Reasons for discharge: transferred to [facility] for renal management  Patient/family agrees with progress made and goals achieved: Yes  Heather Sims 04/29/2011, 10:08 AM

## 2011-05-05 ENCOUNTER — Other Ambulatory Visit: Payer: Self-pay | Admitting: Physician Assistant

## 2011-05-06 ENCOUNTER — Encounter (HOSPITAL_COMMUNITY): Payer: Self-pay | Admitting: Internal Medicine

## 2011-05-06 ENCOUNTER — Observation Stay (HOSPITAL_COMMUNITY)
Admission: AD | Admit: 2011-05-06 | Discharge: 2011-05-08 | Disposition: A | Discharge status: Rehabilitation Facility | Payer: Medicaid Other | Source: Other Acute Inpatient Hospital | Attending: Internal Medicine | Admitting: Internal Medicine

## 2011-05-06 DIAGNOSIS — G931 Anoxic brain damage, not elsewhere classified: Secondary | ICD-10-CM | POA: Insufficient documentation

## 2011-05-06 DIAGNOSIS — Z8674 Personal history of sudden cardiac arrest: Secondary | ICD-10-CM | POA: Insufficient documentation

## 2011-05-06 DIAGNOSIS — N055 Unspecified nephritic syndrome with diffuse mesangiocapillary glomerulonephritis: Secondary | ICD-10-CM | POA: Insufficient documentation

## 2011-05-06 DIAGNOSIS — I5022 Chronic systolic (congestive) heart failure: Secondary | ICD-10-CM | POA: Insufficient documentation

## 2011-05-06 DIAGNOSIS — D72829 Elevated white blood cell count, unspecified: Secondary | ICD-10-CM | POA: Insufficient documentation

## 2011-05-06 DIAGNOSIS — I429 Cardiomyopathy, unspecified: Secondary | ICD-10-CM | POA: Diagnosis not present

## 2011-05-06 DIAGNOSIS — N189 Chronic kidney disease, unspecified: Secondary | ICD-10-CM | POA: Insufficient documentation

## 2011-05-06 DIAGNOSIS — R569 Unspecified convulsions: Secondary | ICD-10-CM | POA: Insufficient documentation

## 2011-05-06 DIAGNOSIS — I517 Cardiomegaly: Secondary | ICD-10-CM | POA: Insufficient documentation

## 2011-05-06 DIAGNOSIS — G609 Hereditary and idiopathic neuropathy, unspecified: Secondary | ICD-10-CM | POA: Insufficient documentation

## 2011-05-06 DIAGNOSIS — I10 Essential (primary) hypertension: Secondary | ICD-10-CM | POA: Diagnosis present

## 2011-05-06 DIAGNOSIS — D638 Anemia in other chronic diseases classified elsewhere: Secondary | ICD-10-CM | POA: Insufficient documentation

## 2011-05-06 DIAGNOSIS — M7989 Other specified soft tissue disorders: Secondary | ICD-10-CM

## 2011-05-06 DIAGNOSIS — I129 Hypertensive chronic kidney disease with stage 1 through stage 4 chronic kidney disease, or unspecified chronic kidney disease: Principal | ICD-10-CM | POA: Insufficient documentation

## 2011-05-06 DIAGNOSIS — D62 Acute posthemorrhagic anemia: Secondary | ICD-10-CM | POA: Diagnosis present

## 2011-05-06 HISTORY — DX: Anemia, unspecified: D64.9

## 2011-05-06 HISTORY — DX: Unspecified convulsions: R56.9

## 2011-05-06 HISTORY — DX: Chronic kidney disease, unspecified: N18.9

## 2011-05-06 HISTORY — DX: Essential (primary) hypertension: I10

## 2011-05-06 LAB — CBC
HCT: 26.5 % — ABNORMAL LOW (ref 36.0–46.0)
MCHC: 31.3 g/dL (ref 30.0–36.0)
MCV: 92.7 fL (ref 78.0–100.0)
RDW: 18.3 % — ABNORMAL HIGH (ref 11.5–15.5)

## 2011-05-06 LAB — COMPREHENSIVE METABOLIC PANEL
ALT: <5 U/L (ref 0–35)
Alkaline Phosphatase: 42 U/L (ref 39–117)
Chloride: 107 mEq/L (ref 96–112)
GFR calc Af Amer: 22 mL/min — ABNORMAL LOW (ref >90)
Glucose, Bld: 88 mg/dL (ref 70–99)
Potassium: 4.1 mEq/L (ref 3.5–5.1)
Sodium: 141 mEq/L (ref 135–145)
Total Protein: 5.4 g/dL — ABNORMAL LOW (ref 6.0–8.3)

## 2011-05-06 MED ORDER — ONDANSETRON HCL 4 MG PO TABS: 4.0000 mg | ORAL_TABLET | Freq: Four times a day (QID) | ORAL | Status: DC | PRN

## 2011-05-06 MED ORDER — CALCIUM ACETATE 667 MG PO CAPS
1334.0000 mg | ORAL_CAPSULE | Freq: Three times a day (TID) | ORAL | Status: DC
  Administered 2011-05-07: 667 mg via ORAL
  Administered 2011-05-07 – 2011-05-08 (×4): 1334 mg via ORAL
  Filled 2011-05-06 (×7): qty 2

## 2011-05-06 MED ORDER — CHOLECALCIFEROL 10 MCG (400 UNIT) PO TABS
400.0000 [IU] | ORAL_TABLET | Freq: Two times a day (BID) | ORAL | Status: DC
  Administered 2011-05-06 – 2011-05-08 (×4): 400 [IU] via ORAL
  Filled 2011-05-06 (×6): qty 1

## 2011-05-06 MED ORDER — ACETAMINOPHEN 650 MG RE SUPP: 650.0000 mg | Freq: Four times a day (QID) | RECTAL | Status: DC | PRN

## 2011-05-06 MED ORDER — SODIUM CHLORIDE 0.9 % IJ SOLN
3.0000 mL | Freq: Two times a day (BID) | INTRAMUSCULAR | Status: DC
  Administered 2011-05-06 – 2011-05-08 (×4): 3 mL via INTRAVENOUS

## 2011-05-06 MED ORDER — PREGABALIN 25 MG PO CAPS
25.0000 mg | ORAL_CAPSULE | Freq: Every day | ORAL | Status: DC
  Administered 2011-05-06 – 2011-05-08 (×3): 25 mg via ORAL
  Filled 2011-05-06 (×3): qty 1

## 2011-05-06 MED ORDER — ISOSORBIDE DINITRATE 20 MG PO TABS
20.0000 mg | ORAL_TABLET | Freq: Three times a day (TID) | ORAL | Status: DC
  Administered 2011-05-06 – 2011-05-08 (×6): 20 mg via ORAL
  Filled 2011-05-06 (×7): qty 1

## 2011-05-06 MED ORDER — LABETALOL HCL 5 MG/ML IV SOLN
10.0000 mg | INTRAVENOUS | Status: DC | PRN
  Filled 2011-05-06: qty 4

## 2011-05-06 MED ORDER — ACETAMINOPHEN 325 MG PO TABS: 650.0000 mg | ORAL_TABLET | Freq: Four times a day (QID) | ORAL | Status: DC | PRN

## 2011-05-06 MED ORDER — DIVALPROEX SODIUM 500 MG PO DR TAB
1000.0000 mg | DELAYED_RELEASE_TABLET | Freq: Two times a day (BID) | ORAL | Status: DC
  Administered 2011-05-06 – 2011-05-08 (×4): 1000 mg via ORAL
  Filled 2011-05-06 (×5): qty 2

## 2011-05-06 MED ORDER — MINOXIDIL 10 MG PO TABS
10.0000 mg | ORAL_TABLET | Freq: Every evening | ORAL | Status: DC
  Administered 2011-05-06 – 2011-05-07 (×2): 10 mg via ORAL
  Filled 2011-05-06 (×3): qty 1

## 2011-05-06 MED ORDER — POLYETHYLENE GLYCOL 3350 17 G PO PACK
17.0000 g | PACK | Freq: Every day | ORAL | Status: DC
  Administered 2011-05-06 – 2011-05-08 (×3): 17 g via ORAL
  Filled 2011-05-06 (×3): qty 1

## 2011-05-06 MED ORDER — DARBEPOETIN ALFA-POLYSORBATE 100 MCG/0.5ML IJ SOLN
100.0000 ug | INTRAMUSCULAR | Status: DC
  Administered 2011-05-07: 100 ug via SUBCUTANEOUS
  Filled 2011-05-06 (×2): qty 0.5

## 2011-05-06 MED ORDER — CLONIDINE HCL 0.3 MG/24HR TD PTWK
0.3000 mg | MEDICATED_PATCH | TRANSDERMAL | Status: DC
  Administered 2011-05-06: 0.3 mg via TRANSDERMAL
  Filled 2011-05-06: qty 1

## 2011-05-06 MED ORDER — DOCUSATE SODIUM 100 MG PO CAPS
100.0000 mg | ORAL_CAPSULE | Freq: Two times a day (BID) | ORAL | Status: DC
  Administered 2011-05-06 – 2011-05-08 (×4): 100 mg via ORAL
  Filled 2011-05-06 (×5): qty 1

## 2011-05-06 MED ORDER — FERROUS SULFATE 325 (65 FE) MG PO TABS
325.0000 mg | ORAL_TABLET | Freq: Three times a day (TID) | ORAL | Status: DC
  Administered 2011-05-06 – 2011-05-08 (×6): 325 mg via ORAL
  Filled 2011-05-06 (×7): qty 1

## 2011-05-06 MED ORDER — HEPARIN SODIUM (PORCINE) 5000 UNIT/ML IJ SOLN
5000.0000 [IU] | Freq: Three times a day (TID) | INTRAMUSCULAR | Status: DC
  Administered 2011-05-06 – 2011-05-08 (×5): 5000 [IU] via SUBCUTANEOUS
  Filled 2011-05-06 (×8): qty 1

## 2011-05-06 MED ORDER — CLONAZEPAM 0.5 MG PO TABS
0.5000 mg | ORAL_TABLET | Freq: Every day | ORAL | Status: DC
  Administered 2011-05-06 – 2011-05-08 (×3): 0.5 mg via ORAL
  Filled 2011-05-06 (×3): qty 1

## 2011-05-06 MED ORDER — METOPROLOL TARTRATE 25 MG PO TABS
25.0000 mg | ORAL_TABLET | Freq: Two times a day (BID) | ORAL | Status: DC
  Administered 2011-05-06 – 2011-05-08 (×4): 25 mg via ORAL
  Filled 2011-05-06 (×5): qty 1

## 2011-05-06 MED ORDER — FAMOTIDINE 20 MG PO TABS
20.0000 mg | ORAL_TABLET | Freq: Every day | ORAL | Status: DC
  Administered 2011-05-06 – 2011-05-08 (×3): 20 mg via ORAL
  Filled 2011-05-06 (×3): qty 1

## 2011-05-06 MED ORDER — ALBUTEROL SULFATE HFA 108 (90 BASE) MCG/ACT IN AERS
1.0000 | INHALATION_SPRAY | RESPIRATORY_TRACT | Status: DC | PRN
  Filled 2011-05-06: qty 6.7

## 2011-05-06 MED ORDER — METOPROLOL TARTRATE 100 MG PO TABS
100.0000 mg | ORAL_TABLET | Freq: Two times a day (BID) | ORAL | Status: DC
  Administered 2011-05-07 – 2011-05-08 (×3): 100 mg via ORAL
  Filled 2011-05-06 (×7): qty 1

## 2011-05-06 MED ORDER — HEPARIN SODIUM (PORCINE) 5000 UNIT/ML IJ SOLN: 5000.0000 [IU] | Freq: Three times a day (TID) | INTRAMUSCULAR | Status: DC

## 2011-05-06 MED ORDER — AMLODIPINE BESYLATE 10 MG PO TABS
10.0000 mg | ORAL_TABLET | Freq: Every day | ORAL | Status: DC
  Administered 2011-05-06 – 2011-05-08 (×3): 10 mg via ORAL
  Filled 2011-05-06 (×3): qty 1

## 2011-05-06 MED ORDER — ONDANSETRON HCL 4 MG/2ML IJ SOLN: 4.0000 mg | Freq: Four times a day (QID) | INTRAMUSCULAR | Status: DC | PRN

## 2011-05-06 NOTE — Progress Notes (Signed)
2813/2213 patient transfer from [facility] to 6700 at 1910 via ambulance, she is alert and oriented, little slow to respond. Patient is a little weak. Have swelling in bilateral feet non-pitting. In the crack of buttock she have a dark scab area, and feet is a little dry.  Biochemist, clinical.

## 2011-05-06 NOTE — H&P (Signed)
Heather Sims is an 21 y.o. female.   PCP - None Nephrology - [facility]. Chief Complaint: Transferred from [facility] for further care. HPI: 21 year-old female who has had a cardiac arrest on the field on March 25, 2010 and had CPR for around 5 minutes was brought to [facility] was intubated and was placed on hypothermic protocol and subsequently had seizures also was found to be having renal failure. 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 Patient had extensive workup done during this stay. Eventually patient was discharged to inpatient rehabilitation. Since patient's creatinine was getting worse it went up to 4 and nephrologist transferred to [facility] for further management. At [facility] as per the discharge summary provided patient did not have much urine output despite given Lasix. Lasix was discontinued and patient was given a fluid challenge of 2 L over 24 hours. After which patient made adequate urine output. During the stay at this level no patient did have renal sonogram showed no obstructive uropathy a small right perinephric hematoma compatible with the postrenal biopsy. Patient also had a 2-D echo had used a total which showed an EF of 50 to person diastolic dysfunction normal RV contractile performance with small pericardial effusion without evidence of tamponade. And patient's antihypertensive medications had to be increased with further addition of isosorbide dinitrate and minoxidil. Patient's Neurontin which was started for neuropathy was discontinued use sedation and with the was started. Her creatinine had improved up to 2.9 discharge. Her discharge labs where showing WBC of 15.1, hemoglobin of 9.4, hematocrit of 39.4, platelets of 142, sodium of 140, potassium of 4.2, chloride of 107, bicarbonate of 21, creatinine of  2.9, calcium 8.1, magnesium 2.3, phosphorus 5.9. PTH was 44, vitamin D less than 5, D3 was 8, total vitamin D was 8, 24 hour creatinine clearance 23.2, urine protein to creatinine ratio 7.2 g complement C3 54, C4 31, anti-GBM negative, ANA speckled 1: 160 and homogenous 1: 160, hepatitis C negative, hepatitis B negative, HIV negative. Patient is transferred back to [facility] as nephrologist at [facility] felt there is no further acute needs for her renal issues.  Past Medical History  Diagnosis Date  . Pneumonia last 2 weeks    'walking pneumonia'  . Hypertension   . Seizures     Past Surgical History  Procedure Date  . Renal biopsy     History reviewed. No pertinent family history. Social History:  reports that she has never smoked. She does not have any smokeless tobacco history on file. She reports that she does not drink alcohol or use illicit drugs.  Allergies: No Known Allergies  No current facility-administered medications on file as of 05/06/2011.   Medications Prior to Admission  Medication Sig Dispense Refill  . albuterol (PROVENTIL HFA;VENTOLIN HFA) 108 (90 BASE) MCG/ACT inhaler Inhale 1 puff into the lungs every 4 (four) hours as needed. For shortness of breath.         No results found for this or any previous visit (from the past 48 hour(s)). No results found.  Review of Systems  Constitutional: Negative.   HENT: Negative.   Eyes: Negative.   Respiratory: Negative.   Cardiovascular: Negative.   Gastrointestinal: Negative.   Genitourinary: Negative.   Musculoskeletal:       Mild edema of lower  extremities.  Skin: Negative.   Neurological: Negative.   Endo/Heme/Allergies: Negative.   Psychiatric/Behavioral: Negative.     Blood pressure 109/65, pulse 79, temperature 98.5 F (36.9 C), temperature source Oral, resp. rate 21, height 5\' 2"  (1.575 m), last menstrual period 03/24/2011, SpO2 96.00%. Physical Exam  Constitutional: She is oriented to  person, place, and time. She appears well-developed and well-nourished. No distress.  HENT:  Head: Normocephalic and atraumatic.  Right Ear: External ear normal.  Left Ear: External ear normal.  Nose: Nose normal.  Mouth/Throat: Oropharynx is clear and moist. No oropharyngeal exudate.  Eyes: Conjunctivae and EOM are normal. Pupils are equal, round, and reactive to light. Right eye exhibits no discharge. Left eye exhibits no discharge. No scleral icterus.  Neck: Normal range of motion. Neck supple.  Cardiovascular: Normal rate, regular rhythm and normal heart sounds.   Respiratory: Effort normal and breath sounds normal. No respiratory distress. She has no wheezes. She has no rales.  GI: Soft. Bowel sounds are normal. She exhibits no distension. There is no tenderness. There is no rebound.  Musculoskeletal: Normal range of motion. She exhibits edema. She exhibits no tenderness.  Neurological: She is alert and oriented to person, place, and time.       Moves upper and lower extremities. But has some incoordination. Speech is slow and has been like that as per patients parents since her cardiac arrest.  Skin: Skin is warm and dry. No rash noted. She is not diaphoretic. No erythema.  Psychiatric: Her behavior is normal.     Assessment/Plan #1. 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 Lorrene Reid. They will be seeing patient in consult. For now will be getting basic labs including metabolic panel CBC phosphorus magnesium level and urinalysis. #2. Hypertension - as per [facility] notes it was very difficult to control. Her blood pressure medication was uptitrated and when necessary IV labetalol was used for systolic blood pressure more than 403 and diastolic more and wanted. Patient presently is on clonidine patch amlodipine metoprolol minoxidil isosorbide dinitrate diuretics was  discontinued. #3. Anoxic brain injury and seizure disorder - patient did not have any seizures and was continued on Depakote. We will check a Depakote level. #4. Peripheral neuropathy and mild clonus with tremors - all likely due to anoxic injury. Other causes for peripheral neuropathy like diabetes mellitus and B12 deficiency were ruled out. Her hemoglobin A1c was 5.3%, B12 levels at 959. Gabapentin was discontinued due to somnolence and limits her was started. #5. Cardiomegaly - patient had reduced EF at home her but repeat echo done at [facility] showed preserved EF with small pericardial effusion. As per the advice adherents [facility] if patient develops hypotension and tachycardia that should be low suspicion for increasing pericardial effusion since patient has been started on minoxidil for her refractory hypertension. #6. Leukocytosis - no infective source was found. Closely follow CBC. #7. Anemia - anemia panel done at [facility] was consistent with a deficiency patient is on ferrous sulfate 3 times a day and had this once weekly on Thursdays.  CODE STATUS - full code.  Rise Patience. 05/06/2011, 9:06 PM

## 2011-05-07 LAB — URINALYSIS, ROUTINE W REFLEX MICROSCOPIC
Bilirubin Urine: NEGATIVE
Protein, ur: >300 mg/dL — AB
Urobilinogen, UA: 0.2 mg/dL (ref 0.0–1.0)

## 2011-05-07 LAB — CBC
HCT: 24.7 % — ABNORMAL LOW (ref 36.0–46.0)
Hemoglobin: 7.9 g/dL — ABNORMAL LOW (ref 12.0–15.0)
MCH: 29.7 pg (ref 26.0–34.0)
MCHC: 32 g/dL (ref 30.0–36.0)

## 2011-05-07 LAB — URINE MICROSCOPIC-ADD ON

## 2011-05-07 LAB — BASIC METABOLIC PANEL
BUN: 50 mg/dL — ABNORMAL HIGH (ref 6–23)
Chloride: 107 mEq/L (ref 96–112)
Glucose, Bld: 88 mg/dL (ref 70–99)
Potassium: 3.9 mEq/L (ref 3.5–5.1)

## 2011-05-07 NOTE — Progress Notes (Signed)
Subjective: BP well controlled; no CP, no SOB and currently w/o acute distress.  Objective: Vital signs in last 24 hours: Temp:  [98 F (36.7 C)-98.5 F (36.9 C)] 98.1 F (36.7 C) (02/14 1400) Pulse Rate:  [70-86] 85  (02/14 1400) Resp:  [18-21] 18  (02/14 1400) BP: (109-135)/(60-77) 118/77 mmHg (02/14 1400) SpO2:  [94 %-99 %] 96 % (02/14 1400) Weight:  [56.3 kg (124 lb 1.9 oz)] 56.3 kg (124 lb 1.9 oz) (02/13 2322) Weight change:  Last BM Date: 05/05/11  Intake/Output from previous day: 02/13 0701 - 02/14 0700 In: 3 [I.V.:3] Out: -  Total I/O In: 600 [P.O.:600] Out: 0    Physical Exam: General: Alert, awake, oriented X2; able to follow  Simple commands; in no acute distress. HEENT: No bruits, no goiter. Heart: Regular rate and rhythm, positive systolic murmur, no rubs. Lungs: Clear to auscultation bilaterally. Abdomen: Soft, nontender, nondistended, positive bowel sounds. Extremities: No clubbing, or cyanosis; positive  for 1 + edema bilaterally. Neuro: No new deficit; slow to response but overall appropriate.   Lab Results: Basic Metabolic Panel:  Basename 05/07/11 0545 05/06/11 2208  NA 140 141  K 3.9 4.1  CL 107 107  CO2 22 22  GLUCOSE 88 88  BUN 50* 50*  CREATININE 3.20* 3.27*  CALCIUM 8.3* 8.5  MG -- 2.6*  PHOS -- 6.2*   Liver Function Tests:  Basename 05/06/11 2208  AST 14  ALT <5  ALKPHOS 42  BILITOT 0.2*  PROT 5.4*  ALBUMIN 1.9*   CBC:  Basename 05/07/11 0545 05/06/11 2208  WBC 12.8* 13.0*  NEUTROABS -- --  HGB 7.9* 8.3*  HCT 24.7* 26.5*  MCV 92.9 92.7  PLT 118* 123*   Thyroid Function Tests:  Basename 05/06/11 2208  TSH 4.224  T4TOTAL --  FREET4 --  T3FREE --  THYROIDAB --   Urine Drug Screen: Drugs of Abuse     Component Value Date/Time   LABOPIA NEGATIVE 03/27/2011 0300   LABOPIA NONE DETECTED 03/26/2011 2041   COCAINSCRNUR NEGATIVE 03/27/2011 0300   COCAINSCRNUR NONE DETECTED 03/26/2011 2041   LABBENZ NEGATIVE 03/27/2011 0300     LABBENZ NONE DETECTED 03/26/2011 2041   AMPHETMU NEGATIVE 03/27/2011 0300   AMPHETMU NONE DETECTED 03/26/2011 2041   THCU NONE DETECTED 03/26/2011 2041   LABBARB NONE DETECTED 03/26/2011 2041     Studies/Results: No results found.  Medications: Scheduled Meds:   . amLODipine  10 mg Oral Daily  . calcium acetate  1,334 mg Oral TID WC  . cholecalciferol  400 Units Oral BID  . clonazePAM  0.5 mg Oral Daily  . cloNIDine  0.3 mg Transdermal Weekly  . darbepoetin  100 mcg Subcutaneous Q7 days  . divalproex  1,000 mg Oral BID  . docusate sodium  100 mg Oral BID  . famotidine  20 mg Oral Daily  . ferrous sulfate  325 mg Oral TID  . heparin  5,000 Units Subcutaneous Q8H  . isosorbide dinitrate  20 mg Oral TID  . metoprolol  100 mg Oral BID  . metoprolol tartrate  25 mg Oral BID  . minoxidil  10 mg Oral QPM  . polyethylene glycol  17 g Oral Daily  . pregabalin  25 mg Oral Daily  . sodium chloride  3 mL Intravenous Q12H  . DISCONTD: heparin  5,000 Units Subcutaneous Q8H   Continuous Infusions:  PRN Meds:.acetaminophen, acetaminophen, albuterol, labetalol, ondansetron (ZOFRAN) IV, ondansetron  Assessment/Plan: 1-Renal failure (ARF), acute on chronic: stable; renal  on board will follow their recommendations.  2-acute heart failure: 2/2 cardiac arrest; no signs of CHF exacerbation; continue current meds.  3-Hypertension: well controlled; continue current therapy.  4-Anoxic brain injury:will ask CIR opinion to determine discharge plans. Patient otherwise stable.   5-Seizures: continue depakote  6-DVT: heparin    LOS: 1 day   Angline Schweigert [facility] [phone]  05/07/2011, 5:21 PM

## 2011-05-07 NOTE — Progress Notes (Signed)
   CARE MANAGEMENT NOTE 05/07/2011  Patient:  Heather Sims, Heather Sims   Account Number:  [id number]  Date Initiated:  05/07/2011  Documentation initiated by:  Onnie Boer  Subjective/Objective Assessment:   PT WAS ADMITTED WITH RENAL FAILURE     Action/Plan:   PROGRESSION OF CARE AND DISCHARGE PLANNING   Anticipated DC Date:  05/10/2011   Anticipated DC Plan:  IP REHAB FACILITY      DC Planning Services  CM consult      Choice offered to / List presented to:             Status of service:  In process, will continue to follow Medicare Important Message given?   (If response is "NO", the following Medicare IM given date fields will be blank) Date Medicare IM given:   Date Additional Medicare IM given:    Discharge Disposition:    Per UR Regulation:  Reviewed for med. necessity/level of care/duration of stay  Comments:  UR COMPLETED.  Onnie Boer, RN, [name] 05/07/11 PT WAS ADMITTED FROM [facility] FOR PLACEMENT INTO REHAB.  WILL F/U AFTER EVALS.

## 2011-05-07 NOTE — Evaluation (Signed)
Physical Therapy Evaluation Patient Details Name: Heather Sims MRN: [id number] DOB: [birth year] Today's Date: 05/07/2011  Problem List:  Patient Active Problem List  Diagnoses  . AKI (acute kidney injury)  . Acute heart failure  . Hypoxemia  . Pneumonia  . Anemia  . Hypertension  . ? Viral Cardiomyopathy  . Anoxic brain injury  . Renal failure (ARF), acute on chronic  . Seizures    Past Medical History:  Past Medical History  Diagnosis Date  . Pneumonia last 2 weeks    'walking pneumonia'  . Hypertension   . Seizures   . Chronic kidney disease   . Anemia    Past Surgical History:  Past Surgical History  Procedure Date  . Renal biopsy     PT Assessment/Plan/Recommendation PT Assessment Clinical Impression Statement: Pt was transferred from CIR to [facility] hospital for kidney failure. Pt has returned  and should be able to resume rehabilitation.   PT Recommendation/Assessment: Patient will need skilled PT in the acute care venue PT Problem List: Decreased strength;Decreased range of motion;Decreased activity tolerance;Decreased mobility;Decreased coordination;Decreased cognition;Decreased knowledge of use of DME;Decreased safety awareness;Decreased knowledge of precautions;Pain Barriers to Discharge: None PT Therapy Diagnosis : Difficulty walking;Abnormality of gait;Generalized weakness;Acute pain PT Plan PT Frequency: Min 3X/week PT Treatment/Interventions: Gait training;DME instruction;Functional mobility training;Therapeutic activities;Therapeutic exercise;Balance training;Patient/family education;Neuromuscular re-education PT Recommendation Follow Up Recommendations: Inpatient Rehab Equipment Recommended: Defer to next venue PT Goals  Acute Rehab PT Goals PT Goal Formulation: With patient Time For Goal Achievement: 2 weeks Pt will Sit at [facility] of Bed: with modified independence PT Goal: Sit at [facility] Of Bed - Progress: Goal set today Pt will go Sit to Stand: with  supervision PT Goal: Sit to Stand - Progress: Goal set today Pt will go Stand to Sit: with min assist;with upper extremity assist PT Goal: Stand to Sit - Progress: Goal set today Pt will Transfer Bed to Chair/Chair to Bed: with min assist PT Transfer Goal: Bed to Chair/Chair to Bed - Progress: Goal set today Pt will Ambulate: 1 - 15 feet;with mod assist;with rolling walker PT Goal: Ambulate - Progress: Goal set today  PT Evaluation Precautions/Restrictions  Precautions Precautions: Fall Restrictions Weight Bearing Restrictions: No Prior Functioning  Home Living Lives With: Family Receives Help From: Family Prior Function Level of Independence: Needs assistance with ADLs;Needs assistance with gait;Needs assistance with tranfers Able to Take Stairs?: No Driving: No Vocation: Student Cognition Cognition Arousal/Alertness: Awake/alert Overall Cognitive Status: Appears within functional limits for tasks assessed Orientation Level: Oriented to person;Oriented to place;Oriented to situation;Disoriented to time Sensation/Coordination Sensation Light Touch: Impaired Detail Light Touch Impaired Details: Impaired LLE;Impaired RLE Stereognosis: Not tested Hot/Cold: Not tested Proprioception: Impaired by gross assessment Additional Comments: bilateral feet/ankle Coordination Gross Motor Movements are Fluid and Coordinated: No Fine Motor Movements are Fluid and Coordinated: No Coordination and Movement Description: myoclonic activity bil LEs with movement Heel Shin Test: Poor coordination bilaterally unable to perform due to pain in feet. Extremity Assessment RUE Assessment RUE Assessment: Exceptions to [facility] RUE Strength RUE Overall Strength: Deficits LUE Assessment LUE Assessment: Exceptions to [facility] LUE Strength LUE Overall Strength: Deficits RLE Assessment RLE Assessment: Exceptions to [facility] LLE Assessment LLE Assessment: Exceptions to [facility] Mobility (including Balance) Bed  Mobility Bed Mobility: Yes Supine to Sit: 5: Supervision;HOB flat Supine to Sit Details (indicate cue type and reason): Pt determined to perform task independently. Required lots of extra time.  Pt uses bilateral UE to assist her Lower extremities  pulling her legs  into hip and knee flexion. Ataxic movement of bilateral LE.   Sitting - Scoot to Edge of Bed: 5: Supervision Sitting - Scoot to [place] of Bed Details (indicate cue type and reason): Again pt required extra time to complete task.  Would not allow PT to assist.   Transfers Transfers: Yes Sit to Stand: From bed;3: Mod assist;With upper extremity assist (Pt 70%) Sit to Stand Details (indicate cue type and reason): Pt insist on initiating standing without assistance.  Pt required assistance to stabilize pt  and assist  with anterior wt shift to achieve terminal knee extension in standing.      Stand to Sit: 4: Min assist;With upper extremity assist;To chair/3-in-1 Stand to Sit Details: Pt required verbal and tactile cues for technique to back up to chair, hand placement and controlled descent as pt collapsed into the chair.  Stand Pivot Transfers: 2: Max Actuary Details (indicate cue type and reason): Assist to manage walker.  Assist to stabilize pt during each LE advance.  Lots of attaxia in LEs, Knees buckled throughout the transfer and pt did not deomonstrate the Upper body strength to stabilize herself. Attaxia improved with verbal cueing for pt to breath and relax, let your legs calm down.   Ambulation/Gait Ambulation/Gait: Yes Ambulation/Gait Assistance: 1: +1 Total assist (second person with chair.  ) Ambulation/Gait Assistance Details (indicate cue type and reason): Assist to stabilze pt due to impaired general strength and impaired balance.  Gait difficulty magnified by pt's c/o severe pain in Bil[place] Lower extremities secondary to neuropathy.  Pt knee buckled throughout and pt had difficulty managing the walker.    Ambulation Distance (Feet): 4 Feet Assistive device: Rolling walker Gait Pattern: Festinating;Ataxic;Decreased dorsiflexion - right;Decreased dorsiflexion - left;Scissoring (pt presents with bilateral foot drop.) Stairs: No Wheelchair Mobility Wheelchair Mobility: No  Posture/Postural Control Posture/Postural Control: Postural limitations Postural Limitations: kyphotic posture.  Balance Balance Assessed: No Exercise    End of Session PT - End of Session Equipment Utilized During Treatment: Gait belt Activity Tolerance: Patient limited by pain;Patient limited by fatigue Patient left: in chair;with family/visitor present Nurse Communication: Mobility status for transfers;Mobility status for ambulation General Behavior During Session: [facility] for tasks performed Cognition: [facility] for tasks performed  Leilani Cespedes 05/07/2011, 4:04 PM Kaytelynn Scripter L. Jolinda Pinkstaff DPT [phone]

## 2011-05-07 NOTE — Progress Notes (Signed)
OT Cancellation Note  Treatment cancelled today due to patient receiving procedure or test: ultrasound to r/o DVT. Will check back as schedule allows. Thanks!  Glendale Chard, OTR/L Pager: [phone] 05/07/2011    Justeen Hehr 05/07/2011, 12:09 PM

## 2011-05-07 NOTE — Progress Notes (Signed)
Assessment/Plan:  1. CKD-MPGN with C3 nephropathy.  2. HTN - improved 3. S/P arrest with anoxic brain injury-   Plan-Need records from [facility]. Should be OK for rehab   HPI: I was asked to see Heather Sims who is a 21 y.o. female recently presenting to [facility] after cardiac arrest, malignant hypertension, and renal disease biopsy showing MPGN with C3 deposition.  Patient apparnetly transferred to [facility] for consideration of Rituxan therapy and felt to have to much scarring on biopsy per her mom's report.  She is transferred back to [facility] for rehab therapy and medical clearance first.  Past Medical History  Diagnosis Date  . Pneumonia last 2 weeks    'walking pneumonia'  . Hypertension   . Seizures   . Chronic kidney disease   . Anemia    Past Surgical History  Procedure Date  . Renal biopsy    Social History:  reports that she has never smoked. She does not have any smokeless tobacco history on file. She reports that she does not drink alcohol or use illicit drugs. Allergies: No Known Allergies History reviewed. No pertinent family history.  Medications: I have reviewed the patient's current medications.  ROS: noncontib Blood pressure 122/62, pulse 70, temperature 98.5 F (36.9 C), temperature source Oral, resp. rate 18, height 5\' 2"  (1.575 m), weight 56.3 kg (124 lb 1.9 oz), last menstrual period 03/24/2011, SpO2 96.00%.  General appearance: alert and cooperative somewhat flat in affect Head: Normocephalic, without obvious abnormality, atraumatic Resp: clear to auscultation bilaterally Chest wall: no tenderness Cardio: regular rate and rhythm, S1, S2 normal, no murmur, click, rub or gallop GI: soft, non-tender; bowel sounds normal; no masses,  no organomegaly Extremities: extremities normal, atraumatic, no cyanosis or edema Skin: Skin color, texture, turgor normal. No rashes or lesions Neurologic: Grossly normal affect flat responses slow Results for orders placed during the  hospital encounter of 05/06/11 (from the past 48 hour(s))  COMPREHENSIVE METABOLIC PANEL     Status: Abnormal   Collection Time   05/06/11 10:08 PM      Component Value Range Comment   Sodium 141  135 - 145 (mEq/L)    Potassium 4.1  3.5 - 5.1 (mEq/L)    Chloride 107  96 - 112 (mEq/L)    CO2 22  19 - 32 (mEq/L)    Glucose, Bld 88  70 - 99 (mg/dL)    BUN 50 (*) 6 - 23 (mg/dL)    Creatinine, Ser 1.61 (*) 0.50 - 1.10 (mg/dL)    Calcium 8.5  8.4 - 10.5 (mg/dL)    Total Protein 5.4 (*) 6.0 - 8.3 (g/dL)    Albumin 1.9 (*) 3.5 - 5.2 (g/dL)    AST 14  0 - 37 (U/L)    ALT <5  0 - 35 (U/L)    Alkaline Phosphatase 42  39 - 117 (U/L)    Total Bilirubin 0.2 (*) 0.3 - 1.2 (mg/dL)    GFR calc non Af Amer 19 (*) >90 (mL/min)    GFR calc Af Amer 22 (*) >90 (mL/min)   CBC     Status: Abnormal   Collection Time   05/06/11 10:08 PM      Component Value Range Comment   WBC 13.0 (*) 4.0 - 10.5 (K/uL)    RBC 2.86 (*) 3.87 - 5.11 (MIL/uL)    Hemoglobin 8.3 (*) 12.0 - 15.0 (g/dL)    HCT 09.6 (*) 04.5 - 46.0 (%)    MCV 92.7  78.0 -  100.0 (fL)    MCH 29.0  26.0 - 34.0 (pg)    MCHC 31.3  30.0 - 36.0 (g/dL)    RDW 16.1 (*) 09.6 - 15.5 (%)    Platelets 123 (*) 150 - 400 (K/uL)   TSH     Status: Normal   Collection Time   05/06/11 10:08 PM      Component Value Range Comment   TSH 4.224  0.350 - 4.500 (uIU/mL)   PHOSPHORUS     Status: Abnormal   Collection Time   05/06/11 10:08 PM      Component Value Range Comment   Phosphorus 6.2 (*) 2.3 - 4.6 (mg/dL)   MAGNESIUM     Status: Abnormal   Collection Time   05/06/11 10:08 PM      Component Value Range Comment   Magnesium 2.6 (*) 1.5 - 2.5 (mg/dL)   BASIC METABOLIC PANEL     Status: Abnormal   Collection Time   05/07/11  5:45 AM      Component Value Range Comment   Sodium 140  135 - 145 (mEq/L)    Potassium 3.9  3.5 - 5.1 (mEq/L)    Chloride 107  96 - 112 (mEq/L)    CO2 22  19 - 32 (mEq/L)    Glucose, Bld 88  70 - 99 (mg/dL)    BUN 50 (*) 6 - 23  (mg/dL)    Creatinine, Ser 0.45 (*) 0.50 - 1.10 (mg/dL)    Calcium 8.3 (*) 8.4 - 10.5 (mg/dL)    GFR calc non Af Amer 20 (*) >90 (mL/min)    GFR calc Af Amer 23 (*) >90 (mL/min)   CBC     Status: Abnormal   Collection Time   05/07/11  5:45 AM      Component Value Range Comment   WBC 12.8 (*) 4.0 - 10.5 (K/uL)    RBC 2.66 (*) 3.87 - 5.11 (MIL/uL)    Hemoglobin 7.9 (*) 12.0 - 15.0 (g/dL)    HCT 40.9 (*) 81.1 - 46.0 (%)    MCV 92.9  78.0 - 100.0 (fL)    MCH 29.7  26.0 - 34.0 (pg)    MCHC 32.0  30.0 - 36.0 (g/dL)    RDW 91.4 (*) 78.2 - 15.5 (%)    Platelets 118 (*) 150 - 400 (K/uL) PLATELET COUNT CONFIRMED BY SMEAR   Heather Sims C 05/07/2011, 12:34 PM

## 2011-05-07 NOTE — Progress Notes (Signed)
*  PRELIMINARY RESULTS* Vascular Ultrasound Lower Extremity Venous duplex has been completed.  Preliminary findings: Bilaterally no DVT or baker's cyst. Unchanged from venous duplex studies on 03/27/11 and 05-05-11.  Farrel Demark RDMS 05/07/2011, 9:39 AM

## 2011-05-08 ENCOUNTER — Inpatient Hospital Stay (HOSPITAL_COMMUNITY)
Admission: RE | Admit: 2011-05-08 | Discharge: 2011-05-27 | DRG: 945 | Disposition: A | Discharge status: Home / Self Care | Payer: Medicaid Other | Source: Ambulatory Visit | Attending: Physical Medicine & Rehabilitation | Admitting: Physical Medicine & Rehabilitation

## 2011-05-08 DIAGNOSIS — N184 Chronic kidney disease, stage 4 (severe): Secondary | ICD-10-CM | POA: Diagnosis present

## 2011-05-08 DIAGNOSIS — Z8669 Personal history of other diseases of the nervous system and sense organs: Secondary | ICD-10-CM

## 2011-05-08 DIAGNOSIS — N189 Chronic kidney disease, unspecified: Secondary | ICD-10-CM | POA: Diagnosis present

## 2011-05-08 DIAGNOSIS — R5381 Other malaise: Secondary | ICD-10-CM

## 2011-05-08 DIAGNOSIS — D72829 Elevated white blood cell count, unspecified: Secondary | ICD-10-CM | POA: Diagnosis present

## 2011-05-08 DIAGNOSIS — R413 Other amnesia: Secondary | ICD-10-CM | POA: Diagnosis present

## 2011-05-08 DIAGNOSIS — D696 Thrombocytopenia, unspecified: Secondary | ICD-10-CM | POA: Diagnosis present

## 2011-05-08 DIAGNOSIS — G931 Anoxic brain damage, not elsewhere classified: Secondary | ICD-10-CM | POA: Diagnosis present

## 2011-05-08 DIAGNOSIS — G40909 Epilepsy, unspecified, not intractable, without status epilepticus: Secondary | ICD-10-CM | POA: Diagnosis present

## 2011-05-08 DIAGNOSIS — I509 Heart failure, unspecified: Secondary | ICD-10-CM | POA: Diagnosis not present

## 2011-05-08 DIAGNOSIS — G4700 Insomnia, unspecified: Secondary | ICD-10-CM | POA: Diagnosis present

## 2011-05-08 DIAGNOSIS — Z5189 Encounter for other specified aftercare: Secondary | ICD-10-CM

## 2011-05-08 DIAGNOSIS — N179 Acute kidney failure, unspecified: Secondary | ICD-10-CM

## 2011-05-08 DIAGNOSIS — I129 Hypertensive chronic kidney disease with stage 1 through stage 4 chronic kidney disease, or unspecified chronic kidney disease: Secondary | ICD-10-CM | POA: Diagnosis present

## 2011-05-08 DIAGNOSIS — I469 Cardiac arrest, cause unspecified: Secondary | ICD-10-CM | POA: Diagnosis present

## 2011-05-08 DIAGNOSIS — J189 Pneumonia, unspecified organism: Secondary | ICD-10-CM | POA: Diagnosis present

## 2011-05-08 DIAGNOSIS — R0902 Hypoxemia: Secondary | ICD-10-CM | POA: Diagnosis present

## 2011-05-08 DIAGNOSIS — D649 Anemia, unspecified: Secondary | ICD-10-CM | POA: Diagnosis present

## 2011-05-08 MED ORDER — DIVALPROEX SODIUM 500 MG PO DR TAB
1000.0000 mg | DELAYED_RELEASE_TABLET | Freq: Two times a day (BID) | ORAL | Status: DC
  Administered 2011-05-08 – 2011-05-12 (×8): 1000 mg via ORAL
  Filled 2011-05-08 (×14): qty 2

## 2011-05-08 MED ORDER — ISOSORBIDE DINITRATE 20 MG PO TABS
20.0000 mg | ORAL_TABLET | Freq: Three times a day (TID) | ORAL | Status: DC
  Administered 2011-05-08 – 2011-05-26 (×50): 20 mg via ORAL
  Filled 2011-05-08 (×57): qty 1

## 2011-05-08 MED ORDER — METOPROLOL TARTRATE 100 MG PO TABS: 100.0000 mg | ORAL_TABLET | Freq: Two times a day (BID) | ORAL | Status: DC

## 2011-05-08 MED ORDER — ACETAMINOPHEN 325 MG PO TABS: 325.0000 mg | ORAL_TABLET | ORAL | Status: DC | PRN

## 2011-05-08 MED ORDER — CALCIUM ACETATE 667 MG PO CAPS
1334.0000 mg | ORAL_CAPSULE | Freq: Three times a day (TID) | ORAL | Status: DC
  Administered 2011-05-08 – 2011-05-26 (×42): 1334 mg via ORAL
  Filled 2011-05-08 (×57): qty 2

## 2011-05-08 MED ORDER — POLYETHYLENE GLYCOL 3350 17 G PO PACK
17.0000 g | PACK | Freq: Every day | ORAL | Status: DC | PRN
  Filled 2011-05-08: qty 1

## 2011-05-08 MED ORDER — SORBITOL 70 % SOLN: 30.0000 mL | Freq: Every day | Status: DC | PRN

## 2011-05-08 MED ORDER — METOPROLOL TARTRATE 25 MG PO TABS: 25.0000 mg | ORAL_TABLET | Freq: Two times a day (BID) | ORAL | Status: DC

## 2011-05-08 MED ORDER — FAMOTIDINE 20 MG PO TABS
20.0000 mg | ORAL_TABLET | Freq: Every day | ORAL | Status: DC
  Administered 2011-05-08 – 2011-05-12 (×5): 20 mg via ORAL
  Filled 2011-05-08 (×7): qty 1

## 2011-05-08 MED ORDER — DOCUSATE SODIUM 100 MG PO CAPS
100.0000 mg | ORAL_CAPSULE | Freq: Two times a day (BID) | ORAL | Status: DC
  Administered 2011-05-08 – 2011-05-15 (×11): 100 mg via ORAL
  Filled 2011-05-08 (×18): qty 1

## 2011-05-08 MED ORDER — CLONAZEPAM 0.5 MG PO TABS
0.5000 mg | ORAL_TABLET | Freq: Every day | ORAL | Status: DC
  Administered 2011-05-09 – 2011-05-15 (×5): 0.5 mg via ORAL
  Filled 2011-05-08 (×8): qty 1

## 2011-05-08 MED ORDER — METOPROLOL TARTRATE 50 MG PO TABS
100.0000 mg | ORAL_TABLET | Freq: Two times a day (BID) | ORAL | Status: DC
  Administered 2011-05-08 – 2011-05-12 (×9): 100 mg via ORAL
  Filled 2011-05-08 (×12): qty 2

## 2011-05-08 MED ORDER — AMLODIPINE BESYLATE 10 MG PO TABS
10.0000 mg | ORAL_TABLET | Freq: Every day | ORAL | Status: DC
  Administered 2011-05-08 – 2011-05-26 (×18): 10 mg via ORAL
  Filled 2011-05-08 (×20): qty 1

## 2011-05-08 MED ORDER — FERROUS SULFATE 325 (65 FE) MG PO TABS
325.0000 mg | ORAL_TABLET | Freq: Three times a day (TID) | ORAL | Status: DC
  Administered 2011-05-08 – 2011-05-12 (×12): 325 mg via ORAL
  Filled 2011-05-08 (×14): qty 1

## 2011-05-08 MED ORDER — PROMETHAZINE HCL 12.5 MG PO TABS: 12.5000 mg | ORAL_TABLET | Freq: Four times a day (QID) | ORAL | Status: DC | PRN

## 2011-05-08 MED ORDER — CLONIDINE HCL 0.3 MG/24HR TD PTWK
0.3000 mg | MEDICATED_PATCH | TRANSDERMAL | Status: DC
  Administered 2011-05-12 – 2011-05-26 (×3): 0.3 mg via TRANSDERMAL
  Filled 2011-05-08 (×3): qty 1

## 2011-05-08 MED ORDER — PREGABALIN 25 MG PO CAPS
25.0000 mg | ORAL_CAPSULE | Freq: Every day | ORAL | Status: DC
  Administered 2011-05-09 – 2011-05-12 (×4): 25 mg via ORAL
  Filled 2011-05-08 (×4): qty 1

## 2011-05-08 MED ORDER — HEPARIN SODIUM (PORCINE) 5000 UNIT/ML IJ SOLN
5000.0000 [IU] | Freq: Three times a day (TID) | INTRAMUSCULAR | Status: DC
  Administered 2011-05-08 – 2011-05-20 (×35): 5000 [IU] via SUBCUTANEOUS
  Filled 2011-05-08 (×39): qty 1

## 2011-05-08 MED ORDER — DARBEPOETIN ALFA-POLYSORBATE 100 MCG/0.5ML IJ SOLN
100.0000 ug | INTRAMUSCULAR | Status: DC
  Administered 2011-05-14 – 2011-05-21 (×2): 100 ug via SUBCUTANEOUS
  Filled 2011-05-08 (×4): qty 0.5

## 2011-05-08 MED ORDER — PROMETHAZINE HCL 12.5 MG RE SUPP: 12.5000 mg | Freq: Four times a day (QID) | RECTAL | Status: DC | PRN

## 2011-05-08 MED ORDER — CHOLECALCIFEROL 10 MCG (400 UNIT) PO TABS
400.0000 [IU] | ORAL_TABLET | Freq: Two times a day (BID) | ORAL | Status: DC
  Administered 2011-05-08 – 2011-05-26 (×32): 400 [IU] via ORAL
  Filled 2011-05-08 (×43): qty 1

## 2011-05-08 MED ORDER — METOPROLOL TARTRATE 25 MG PO TABS
25.0000 mg | ORAL_TABLET | Freq: Two times a day (BID) | ORAL | Status: DC
  Administered 2011-05-08 – 2011-05-12 (×9): 25 mg via ORAL
  Filled 2011-05-08 (×13): qty 1

## 2011-05-08 MED ORDER — MINOXIDIL 10 MG PO TABS
10.0000 mg | ORAL_TABLET | Freq: Every evening | ORAL | Status: DC
  Administered 2011-05-08 – 2011-05-11 (×4): 10 mg via ORAL
  Filled 2011-05-08 (×5): qty 1

## 2011-05-08 MED ORDER — PROMETHAZINE HCL 25 MG/ML IJ SOLN: 12.5000 mg | Freq: Four times a day (QID) | INTRAMUSCULAR | Status: DC | PRN

## 2011-05-08 MED ORDER — ALBUTEROL SULFATE HFA 108 (90 BASE) MCG/ACT IN AERS
1.0000 | INHALATION_SPRAY | RESPIRATORY_TRACT | Status: DC | PRN
  Filled 2011-05-08: qty 6.7

## 2011-05-08 MED ORDER — POLYETHYLENE GLYCOL 3350 17 G PO PACK
17.0000 g | PACK | Freq: Every day | ORAL | Status: DC
  Administered 2011-05-11 – 2011-05-27 (×14): 17 g via ORAL
  Filled 2011-05-08 (×21): qty 1

## 2011-05-08 NOTE — Progress Notes (Signed)
Assessment/Plan:  1. CKD-MPGN with C3 nephropathy.  2. HTN - improved CONTROL 3. S/P arrest with anoxic brain injury-  Plan-Need records from [facility].          -Should be OK for rehab      S:no complaints O:BP 130/83  Pulse 74  Temp(Src) 97.4 F (36.3 C) (Oral)  Resp 18  Ht 5\' 2"  (1.575 m)  Wt 58 kg (127 lb 13.9 oz)  BMI 23.39 kg/m2  SpO2 100%  LMP 03/24/2011  Intake/Output Summary (Last 24 hours) at 05/08/11 1217 Last data filed at 05/08/11 0820  Gross per 24 hour  Intake    603 ml  Output    500 ml  Net    103 ml   Weight change: 1.7 kg (3 lb 12 oz) RUE:AVWUJ CVS:RRR Resp:clear Abd:sof Ext:no edema    . amLODipine  10 mg Oral Daily  . calcium acetate  1,334 mg Oral TID WC  . cholecalciferol  400 Units Oral BID  . clonazePAM  0.5 mg Oral Daily  . cloNIDine  0.3 mg Transdermal Weekly  . darbepoetin  100 mcg Subcutaneous Q7 days  . divalproex  1,000 mg Oral BID  . docusate sodium  100 mg Oral BID  . famotidine  20 mg Oral Daily  . ferrous sulfate  325 mg Oral TID  . heparin  5,000 Units Subcutaneous Q8H  . isosorbide dinitrate  20 mg Oral TID  . metoprolol  100 mg Oral BID  . metoprolol tartrate  25 mg Oral BID  . minoxidil  10 mg Oral QPM  . polyethylene glycol  17 g Oral Daily  . pregabalin  25 mg Oral Daily  . sodium chloride  3 mL Intravenous Q12H   No results found. BMET  Lab 05/07/11 0545 05/06/11 2208  NA 140 141  K 3.9 4.1  CL 107 107  CO2 22 22  GLUCOSE 88 88  BUN 50* 50*  CREATININE 3.20* 3.27*  ALB -- --  CALCIUM 8.3* 8.5  PHOS -- 6.2*   CBC  Lab 05/07/11 0545 05/06/11 2208  WBC 12.8* 13.0*  NEUTROABS -- --  HGB 7.9* 8.3*  HCT 24.7* 26.5*  MCV 92.9 92.7  PLT 118* 123*   Heather Sims C

## 2011-05-08 NOTE — Plan of Care (Signed)
Problem: Consults Goal: RH BRAIN INJURY PATIENT EDUCATION Description: See Patient Education module for eduction specifics Patient and family will increase knowledge of brain injury.  Problem: RH BOWEL ELIMINATION Goal: RH STG MANAGE BOWEL WITH ASSISTANCE STG Manage Bowel with Assistance. Patient will have bm every 2- 3 days with medications as needed.  Problem: RH SKIN INTEGRITY Goal: RH STG SKIN FREE OF INFECTION/BREAKDOWN Patient will have skin free of infection and breakdown at discharge  Problem: RH KNOWLEDGE DEFICIT Goal: RH STG INCREASE KNOWLEDGE OF HYPERTENSION Patient and family will be able to verbalize understanding of hypertension and how to treat.

## 2011-05-08 NOTE — H&P (Signed)
Physical Medicine and Rehabilitation Admission H&P  Heather Sims is an 21 y.o. female.   No chief complaint on file. : HPI: 21-year-old right-handed female with recent diagnosis of hypertension as well as upper respiratory infection which she had been placed on Avelox. Admitted January 3 with sudden onset shortness of breath and cardiorespiratory failure with CPR in the field. She was pulseless for approximately 5 minutes and intubated in the emergency department. Underwent hypothermia protocol and suffered seizures upon rewarming. Cranial CT scan and MRI of the brain showed no acute changes. Echocardiogram with ejection fraction 40% and systolic function moderately reduced. TEE with diffuse hypokinesis ejection fraction 45%. Venous Doppler studies lower extremities were negative. EEG was minimal seizure activity. MRI of cervical thoracic lumbar spine unremarkable. Noted hemoglobin 8.1 and creatinine 2.31 upon admission she was transfused 3 units of packed red blood cells. Renal ultrasound was negative for hydronephrosis. Nephrology consulted felt renal insufficiency due to hypertension. Renal biopsy completed January 18 to complete workup of acute renal insufficiency with results pending. Post operative biopsy with perirenal hematoma and close monitoring with CBC that remained stable. Latest creatinine 1.95 felt to be stabilized. High dose Lasix had been discontinued with stabilizing renal function January 20. Patient had been on subcutaneous heparin for deep vein thrombosis prophylaxis was discontinued January 17 with renal biopsy completed January 18. Placed on valproate 1000 mg every 12 hours per neurology services for seizure disorder. No further seizure activity has been reported. Patient continued to have bouts of myoclonus of the lower extremities improved after being placed on clonazepam. 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 Patient  was ultimately admitted to inpatient rehabilitation services January 21 for comprehensive rehabilitation program. Patient has ongoing followup in regards to acute renal failure as well as her malignant hypertension. 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 Her creatinine gradually continued to worsen. Her Lasix had been discontinued on February 2. Followup renal services advised patient to be transferred to [facility] Dr.Nachman who is an expert in the field on glomerular disease. Patient was transferred to [facility] February 5 for further management regards to her renal function. At [facility] she was given a fluid challenge of 2 L over 24 hours after which patient made adequate urine output per old records. She did receive a renal sonogram that showed no structural uropathy and a small right peri-nephric hematoma compatible with post renal biopsy. A 2-D echocardiogram completed with ejection fraction of 50% and mild diastolic dysfunction. 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 Her latest creatinine at [facility] was 2.9 and followup once arrived to [facility] creatinine 3.2. No further workup was indicated this patient was transferred back to [facility] on February 13 for ongoing medical care and consideration of inpatient rehabilitation services to be resumed. She had been requiring moderate to max assist for activities of daily living and minimal assistance to ambulate 20 feet with a rolling walker for overall mobility and max assist for her safety and awareness prior to her discharge to [facility]. Physical medicine and rehabilitation was again consulted for followup and plan was to readmit back inpatient rehabilitation services for ongoing therapy and maximize her overall mobility.   Review of Systems  Constitutional: Positive for malaise/fatigue.  Respiratory: Negative for cough.     Musculoskeletal: Positive for myalgias.  Neurological: Negative for dizziness.  Psychiatric/Behavioral: Positive for memory loss.   The patient has insomnia.   All other systems reviewed and are negative.   Past Medical History  Diagnosis Date  . Pneumonia last 2 weeks    'walking pneumonia'  . Hypertension   . Seizures   . Chronic kidney disease   . Anemia    Past Surgical History  Procedure Date  . Renal biopsy    History reviewed. No pertinent family history. Social History:  reports that she has never smoked. She does not have any smokeless tobacco history on file. She reports that she does not drink alcohol or use illicit drugs. Allergies: No Known Allergies Medications Prior to Admission  Medication Dose Route Frequency Provider Last Rate Last Dose  . acetaminophen (TYLENOL) tablet 650 mg  650 mg Oral Q6H PRN Arshad N. Kakrakandy, MD       Or  . acetaminophen (TYLENOL) suppository 650 mg  650 mg Rectal Q6H PRN Arshad N. Kakrakandy, MD      . albuterol (PROVENTIL HFA;VENTOLIN HFA) 108 (90 BASE) MCG/ACT inhaler 1 puff  1 puff Inhalation Q4H PRN Arshad N. Kakrakandy, MD      . amLODipine (NORVASC) tablet 10 mg  10 mg Oral Daily Arshad N. Kakrakandy, MD   10 mg at 05/07/11 1200  . calcium acetate (PHOSLO) capsule 1,334 mg  1,334 mg Oral TID WC Arshad N. Kakrakandy, MD   1,334 mg at 05/07/11 1803  . cholecalciferol (VITAMIN D) tablet 400 Units  400 Units Oral BID Arshad N. Kakrakandy, MD   400 Units at 05/07/11 2203  . clonazePAM (KLONOPIN) tablet 0.5 mg  0.5 mg Oral Daily Arshad N. Kakrakandy, MD   0.5 mg at 05/07/11 1210  . cloNIDine (CATAPRES - Dosed in mg/24 hr) patch 0.3 mg  0.3 mg Transdermal Weekly Arshad N. Kakrakandy, MD   0.3 mg at 05/06/11 2255  . darbepoetin (ARANESP) injection 100 mcg  100 mcg Subcutaneous Q7 days Arshad N. Kakrakandy, MD   100 mcg at 05/07/11 2204  . divalproex (DEPAKOTE) DR tablet 1,000 mg  1,000 mg Oral BID Arshad N. Kakrakandy, MD   1,000 mg at  05/07/11 2204  . docusate sodium (COLACE) capsule 100 mg  100 mg Oral BID Arshad N. Kakrakandy, MD   100 mg at 05/07/11 2203  . famotidine (PEPCID) tablet 20 mg  20 mg Oral Daily Arshad N. Kakrakandy, MD   20 mg at 05/07/11 1204  . ferrous sulfate tablet 325 mg  325 mg Oral TID Arshad N. Kakrakandy, MD   325 mg at 05/07/11 2203  . heparin injection 5,000 Units  5,000 Units Subcutaneous Q8H Arshad N. Kakrakandy, MD   5,000 Units at 05/07/11 2205  . isosorbide dinitrate (ISORDIL) tablet 20 mg  20 mg Oral TID Arshad N. Kakrakandy, MD   20 mg at 05/07/11 2204  . labetalol (NORMODYNE,TRANDATE) injection 10 mg  10 mg Intravenous Q2H PRN Arshad N. Kakrakandy, MD      . metoprolol (LOPRESSOR) tablet 100 mg  100 mg Oral BID Arshad N. Kakrakandy, MD   100 mg at 05/07/11 2204  . metoprolol tartrate (LOPRESSOR) tablet 25 mg  25 mg Oral BID Arshad N. Kakrakandy, MD   25 mg at 05/07/11 2204  . minoxidil (LONITEN) tablet 10 mg  10 mg Oral QPM Arshad N. Kakrakandy, MD   10 mg at 05/07/11 1802  . ondansetron (ZOFRAN) tablet 4 mg  4 mg Oral Q6H PRN Arshad N. Kakrakandy, MD       Or  . ondansetron (ZOFRAN) injection   4 mg  4 mg Intravenous Q6H PRN Arshad N. Kakrakandy, MD      . polyethylene glycol (MIRALAX / GLYCOLAX) packet 17 g  17 g Oral Daily Arshad N. Kakrakandy, MD   17 g at 05/07/11 1155  . pregabalin (LYRICA) capsule 25 mg  25 mg Oral Daily Arshad N. Kakrakandy, MD   25 mg at 05/07/11 1205  . sodium chloride 0.9 % injection 3 mL  3 mL Intravenous Q12H Arshad N. Kakrakandy, MD   3 mL at 05/07/11 2206  . DISCONTD: heparin injection 5,000 Units  5,000 Units Subcutaneous Q8H Arshad N. Kakrakandy, MD       Medications Prior to Admission  Medication Sig Dispense Refill  . albuterol (PROVENTIL HFA;VENTOLIN HFA) 108 (90 BASE) MCG/ACT inhaler Inhale 1 puff into the lungs every 4 (four) hours as needed. For shortness of breath.         Home: Home Living Lives With: Family Receives Help From: Family     Functional History: Prior Function Level of Independence: Needs assistance with ADLs;Needs assistance with gait;Needs assistance with tranfers Able to Take Stairs?: No Driving: No Vocation: Student  Functional Status:  Mobility: Bed Mobility Bed Mobility: Yes Supine to Sit: 5: Supervision;HOB flat Supine to Sit Details (indicate cue type and reason): Pt determined to perform task independently. Required lots of extra time.  Pt uses bilateral UE to assist her Lower extremities  pulling her legs into hip and knee flexion. Ataxic movement of bilateral LE.   Sitting - Scoot to Edge of Bed: 5: Supervision Sitting - Scoot to Edge of Bed Details (indicate cue type and reason): Again pt required extra time to complete task.  Would not allow PT to assist.   Transfers Transfers: Yes Sit to Stand: From bed;3: Mod assist;With upper extremity assist (Pt 70%) Sit to Stand Details (indicate cue type and reason): Pt insist on initiating standing without assistance.  Pt required assistance to stabilize pt  and assist  with anterior wt shift to achieve terminal knee extension in standing.      Stand to Sit: 4: Min assist;With upper extremity assist;To chair/3-in-1 Stand to Sit Details: Pt required verbal and tactile cues for technique to back up to chair, hand placement and controlled descent as pt collapsed into the chair.  Stand Pivot Transfers: 2: Max assist Stand Pivot Transfer Details (indicate cue type and reason): Assist to manage walker.  Assist to stabilize pt during each LE advance.  Lots of attaxia in LEs, Knees buckled throughout the transfer and pt did not deomonstrate the Upper body strength to stabilize herself. Attaxia improved with verbal cueing for pt to breath and relax, let your legs calm down.   Ambulation/Gait Ambulation/Gait: Yes Ambulation/Gait Assistance: 1: +1 Total assist (second person with chair.  ) Ambulation/Gait Assistance Details (indicate cue type and reason): Assist to  stabilze pt due to impaired general strength and impaired balance.  Gait difficulty magnified by pt's c/o severe pain in Bilater Lower extremities secondary to neuropathy.  Pt knee buckled throughout and pt had difficulty managing the walker.  Ambulation Distance (Feet): 4 Feet Assistive device: Rolling walker Gait Pattern: Festinating;Ataxic;Decreased dorsiflexion - right;Decreased dorsiflexion - left;Scissoring (pt presents with bilateral foot drop.) Stairs: No Wheelchair Mobility Wheelchair Mobility: No  ADL:    Cognition: Cognition Arousal/Alertness: Awake/alert Orientation Level: Oriented to person;Oriented to place;Oriented to situation;Disoriented to time Cognition Arousal/Alertness: Awake/alert Overall Cognitive Status: Appears within functional limits for tasks assessed Orientation Level: Oriented to person;Oriented to place;Oriented to   situation;Disoriented to time   Blood pressure 133/81, pulse 85, temperature 98 F (36.7 C), temperature source Oral, resp. rate 18, height 5' 2" (1.575 m), weight 58 kg (127 lb 13.9 oz), last menstrual period 03/24/2011, SpO2 100.00%. Physical Exam  Vitals reviewed. Constitutional: She appears well-developed.  HENT:  Head: Normocephalic.  Neck: Normal range of motion. Neck supple. No thyromegaly present.  Cardiovascular: Normal rate and regular rhythm.   Pulmonary/Chest: Breath sounds normal. She has no wheezes.  Abdominal: She exhibits no distension. There is no tenderness.  Musculoskeletal: She exhibits no edema.  Neurological:       Lethargic but arousable. She would state her name. It is difficult to keep her attention to tasks during exam. Follow one-step commands. IPO phonic and dysarthric at times. She moves all fours but inconsistent. Strength grossly 3-4 or 5. She has 3-4 beats of clonus of either ankle. Reflexes were hyperactive at 3+.  Psychiatric:       Mood is flat and distractible    Results for orders placed during the  hospital encounter of 05/06/11 (from the past 48 hour(s))  COMPREHENSIVE METABOLIC PANEL     Status: Abnormal   Collection Time   05/06/11 10:08 PM      Component Value Range Comment   Sodium 141  135 - 145 (mEq/L)    Potassium 4.1  3.5 - 5.1 (mEq/L)    Chloride 107  96 - 112 (mEq/L)    CO2 22  19 - 32 (mEq/L)    Glucose, Bld 88  70 - 99 (mg/dL)    BUN 50 (*) 6 - 23 (mg/dL)    Creatinine, Ser 3.27 (*) 0.50 - 1.10 (mg/dL)    Calcium 8.5  8.4 - 10.5 (mg/dL)    Total Protein 5.4 (*) 6.0 - 8.3 (g/dL)    Albumin 1.9 (*) 3.5 - 5.2 (g/dL)    AST 14  0 - 37 (U/L)    ALT <5  0 - 35 (U/L)    Alkaline Phosphatase 42  39 - 117 (U/L)    Total Bilirubin 0.2 (*) 0.3 - 1.2 (mg/dL)    GFR calc non Af Amer 19 (*) >90 (mL/min)    GFR calc Af Amer 22 (*) >90 (mL/min)   CBC     Status: Abnormal   Collection Time   05/06/11 10:08 PM      Component Value Range Comment   WBC 13.0 (*) 4.0 - 10.5 (K/uL)    RBC 2.86 (*) 3.87 - 5.11 (MIL/uL)    Hemoglobin 8.3 (*) 12.0 - 15.0 (g/dL)    HCT 26.5 (*) 36.0 - 46.0 (%)    MCV 92.7  78.0 - 100.0 (fL)    MCH 29.0  26.0 - 34.0 (pg)    MCHC 31.3  30.0 - 36.0 (g/dL)    RDW 18.3 (*) 11.5 - 15.5 (%)    Platelets 123 (*) 150 - 400 (K/uL)   TSH     Status: Normal   Collection Time   05/06/11 10:08 PM      Component Value Range Comment   TSH 4.224  0.350 - 4.500 (uIU/mL)   PHOSPHORUS     Status: Abnormal   Collection Time   05/06/11 10:08 PM      Component Value Range Comment   Phosphorus 6.2 (*) 2.3 - 4.6 (mg/dL)   MAGNESIUM     Status: Abnormal   Collection Time   05/06/11 10:08 PM      Component Value Range Comment     Magnesium 2.6 (*) 1.5 - 2.5 (mg/dL)   BASIC METABOLIC PANEL     Status: Abnormal   Collection Time   05/07/11  5:45 AM      Component Value Range Comment   Sodium 140  135 - 145 (mEq/L)    Potassium 3.9  3.5 - 5.1 (mEq/L)    Chloride 107  96 - 112 (mEq/L)    CO2 22  19 - 32 (mEq/L)    Glucose, Bld 88  70 - 99 (mg/dL)    BUN 50 (*) 6 - 23  (mg/dL)    Creatinine, Ser 3.20 (*) 0.50 - 1.10 (mg/dL)    Calcium 8.3 (*) 8.4 - 10.5 (mg/dL)    GFR calc non Af Amer 20 (*) >90 (mL/min)    GFR calc Af Amer 23 (*) >90 (mL/min)   CBC     Status: Abnormal   Collection Time   05/07/11  5:45 AM      Component Value Range Comment   WBC 12.8 (*) 4.0 - 10.5 (K/uL)    RBC 2.66 (*) 3.87 - 5.11 (MIL/uL)    Hemoglobin 7.9 (*) 12.0 - 15.0 (g/dL)    HCT 24.7 (*) 36.0 - 46.0 (%)    MCV 92.9  78.0 - 100.0 (fL)    MCH 29.7  26.0 - 34.0 (pg)    MCHC 32.0  30.0 - 36.0 (g/dL)    RDW 18.2 (*) 11.5 - 15.5 (%)    Platelets 118 (*) 150 - 400 (K/uL) PLATELET COUNT CONFIRMED BY SMEAR  URINALYSIS, ROUTINE W REFLEX MICROSCOPIC     Status: Abnormal   Collection Time   05/07/11  6:23 PM      Component Value Range Comment   Color, Urine YELLOW  YELLOW     APPearance CLOUDY (*) CLEAR     Specific Gravity, Urine 1.017  1.005 - 1.030     pH 5.0  5.0 - 8.0     Glucose, UA NEGATIVE  NEGATIVE (mg/dL)    Hgb urine dipstick LARGE (*) NEGATIVE     Bilirubin Urine NEGATIVE  NEGATIVE     Ketones, ur NEGATIVE  NEGATIVE (mg/dL)    Protein, ur >300 (*) NEGATIVE (mg/dL)    Urobilinogen, UA 0.2  0.0 - 1.0 (mg/dL)    Nitrite NEGATIVE  NEGATIVE     Leukocytes, UA MODERATE (*) NEGATIVE    URINE MICROSCOPIC-ADD ON     Status: Abnormal   Collection Time   05/07/11  6:23 PM      Component Value Range Comment   Squamous Epithelial / LPF RARE  RARE     WBC, UA 7-10  <3 (WBC/hpf)    RBC / HPF 21-50  <3 (RBC/hpf)    Bacteria, UA FEW (*) RARE     Casts HYALINE CASTS (*) NEGATIVE  GRANULAR CAST   Urine-Other AMORPHOUS URATES/PHOSPHATES      No results found.  Post Admission Physician Evaluation: 1. Functional deficits secondary  to anoxic brain injury. 2. Patient is admitted to receive collaborative, interdisciplinary care between the physiatrist, rehab nursing staff, and therapy team. 3. Patient's level of medical complexity and substantial therapy needs in context of that  medical necessity cannot be provided at a lesser intensity of care such as a SNF. 4. Patient has experienced substantial functional loss from his/her baseline which was documented above under the "Functional History" and "Functional Status" headings.  Judging by the patient's diagnosis, physical exam, and functional history, the patient has potential for functional   progress which will result in measurable gains while on inpatient rehab.  These gains will be of substantial and practical use upon discharge  in facilitating mobility and self-care at the household level. 5. Physiatrist will provide 24 hour management of medical needs as well as oversight of the therapy plan/treatment and provide guidance as appropriate regarding the interaction of the two. 6. 24 hour rehab nursing will assist with bladder management, bowel management, safety, skin/wound care, disease management, medication administration, pain management and patient education  and help integrate therapy concepts, techniques,education, etc. 7. PT will assess and treat for:  Functional mobility, gait, neuromuscular reeducation, safety, family education.  Goals are: Supervision to minimal assistance with basic gait and wheelchair mobility. 8. OT will assess and treat for: Upper extremity strength, ADLs, neuromuscular reeducation, functional mobility.   Goals are: Supervision to minimal assistance/occasional moderate assistance. 9. SLP will assess and treat for: Speech intelligibility/cognition.  Goals are: Minimal to moderate assistance. 10. Case Management and Social Worker will assess and treat for psychological issues and discharge planning. 11. Team conference will be held weekly to assess progress toward goals and to determine barriers to discharge. 12.  Patient will receive at least 3 hours of therapy per day at least 5 days per week. 13. ELOS and Prognosis: 7-10 days good  Patient is well known to the rehabilitation service. She has  become generally deconditioned since her recent  admission to [facility] for renal workup and assessment. Patient needs further therapy to return to levels at which she was reaching at the end of her initial rehabilitation stay.  Medical Problem List and Plan: 1. 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 DVT Prophylaxis/Anticoagulation: Subcutaneous heparin. Monitor platelet counts and any signs of bleeding 3. seizure disorder. Depakote 1000 mg twice a day. Monitor for any seizure activity 4. Renal failure. Followup [facility]. Weigh patient daily. Followup chemistries routinely. 5. Hypertension. Norvasc 10 mg daily, clonidine patch 0.3 mg weekly, Isordil 10 mg 3 times daily, Lopressor twice a day, minoxidil 10 mg daily. Monitor closely with increased activity. Patient's blood pressures have been labile. 6. Anemia. Continue iron supplement. Followup CBC  Zach Bevin Das, MD      routinely.  5. Hypertension. Norvasc 10 mg daily, clonidine patch 0.3 mg weekly, Isordil 10 mg 3 times daily, Lopressor twice a day, minoxidil 10 mg daily. Monitor closely with increased activity. Patient's blood pressures have been labile.  6. Anemia. Continue iron supplement. Followup CBC  Ivory Broad, MD

## 2011-05-08 NOTE — Discharge Summary (Signed)
Physician Discharge Summary  Patient ID: Heather Sims MRN: [id number] DOB/AGE: Jul 28, 1990 21 y.o.  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 For shortness of breath.      amLODipine 10 MG tablet   Commonly known as: NORVASC   Take 10 mg by mouth daily.      calcium acetate 667 MG capsule   Commonly known as: PHOSLO   Take 1,334 mg by mouth 3 (three) times daily with meals.      cholecalciferol 400 UNITS Tabs   Commonly known as: VITAMIN D   Take 400 Units by mouth 2 (two) times daily.      clonazePAM 0.5 MG tablet   Commonly known as: KLONOPIN   Take 0.5 mg by mouth daily.      cloNIDine 0.3 mg/24hr   Commonly known as: CATAPRES - Dosed in mg/24 hr   Place 1 patch onto the skin once a week. On tuesdays      darbepoetin 100 MCG/0.5ML Soln   Commonly known as: ARANESP   Inject 100 mcg into the skin every 7 (seven) days. On Thursdays      divalproex 500 MG DR tablet   Commonly known as: DEPAKOTE   Take 1,000 mg by mouth 2 (two) times daily.      docusate sodium 100 MG capsule   Commonly known as: COLACE   Take 100 mg by mouth 2 (two) times daily.      famotidine 20 MG tablet   Commonly known as: PEPCID   Take 20 mg by mouth daily.      ferrous sulfate 325 (65 FE) MG tablet   Take 325 mg by mouth 3 (three) times daily.      heparin 5000 UNIT/ML injection   Inject 5,000 Units into the skin every 8 (eight) hours.      isosorbide dinitrate 20 MG  tablet   Commonly known as: ISORDIL   Take 20 mg by mouth 3 (three) times daily.      metoprolol 50 MG tablet   Commonly known as: LOPRESSOR   Take 100 mg by mouth 2 (two) times daily. With 1 tablet of the 25 mg      metoprolol tartrate 25 MG tablet   Commonly known as: LOPRESSOR   Take 25 mg by mouth 2 (two) times daily. With 2 tablets of 50 mg      minoxidil 10 MG tablet   Commonly known as: LONITEN   Take 10 mg by mouth every evening.      ondansetron 4 MG disintegrating tablet   Commonly known as: ZOFRAN-ODT   Take 4 mg by mouth every 8 (eight) hours as needed. For nausea      polyethylene glycol packet   Commonly known as: MIRALAX / GLYCOLAX   Take 17 g by mouth daily.      pregabalin 25 MG capsule   Commonly known as: LYRICA   Take 25 mg by mouth daily.  Disposition and Follow-up:  Patient stable and ready to return to inpatient rehab for further physical and cognitive rehabilitation process. VSS and BP well controlled with current regimen. Per renal no further work up needed. The rest of medical problems stable as well.  Consults:   Nephrology Inpatient rehab   Significant Diagnostic Studies:  No results found.  Brief H and P: 21 year-old female who has had a cardiac arrest on the field on March 25, 2010 and had CPR for around 5 minutes was brought to [facility] was intubated and was placed on hypothermic protocol and subsequently had seizures also was found to be having renal failure. 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 Patient had extensive workup done during this stay. Eventually patient was discharged to inpatient rehabilitation. Since patient's creatinine was getting worse it went up to 4 and nephrologist transferred to [facility] for further management. At [facility] as per the  discharge summary provided patient did not have much urine output despite given Lasix. Lasix was discontinued and patient was given a fluid challenge of 2 L over 24 hours. After which patient made adequate urine output. During the stay at this level no patient did have renal sonogram showed no obstructive uropathy a small right perinephric hematoma compatible with the postrenal biopsy. Patient also had a 2-D echo had used a total which showed an EF of 50 to person diastolic dysfunction normal RV contractile performance with small pericardial effusion without evidence of tamponade. And patient's antihypertensive medications had to be increased with further addition of isosorbide dinitrate and minoxidil. Patient's Neurontin which was started for neuropathy was discontinued use sedation and with the was started. Her creatinine had improved up to 2.9 discharge. Her discharge labs where showing WBC of 15.1, hemoglobin of 9.4, hematocrit of 39.4, platelets of 142, sodium of 140, potassium of 4.2, chloride of 107, bicarbonate of 21, creatinine of 2.9, calcium 8.1, magnesium 2.3, phosphorus 5.9. PTH was 44, vitamin D less than 5, D3 was 8, total vitamin D was 8, 24 hour creatinine clearance 23.2, urine protein to creatinine ratio 7.2 g complement C3 54, C4 31, anti-GBM negative, ANA speckled 1: 160 and homogenous 1: 160, hepatitis C negative, hepatitis B negative, HIV negative.  Patient is transferred back to [facility] as nephrologist at [facility] felt there is no further acute needs for her renal issues.    Hospital Course:  1-Renal failure (ARF), acute on chronic: stable; no fuerther work up needed per renal. Continue BP control and follow up at discharge with Dr. Lorrene Reid at discharge.  2-Hypertension: improved and better control; continue current regimen.  3-Anoxic brain injury:will transfer to inpatient rehab for further cognitive and physical rehabilitation.  4-Anemia of chronic  disease:Stable; due to CKD. No transfusion needed.  5-Seizures: continue depakote; no seizure seen during admission.  6-Physical decondition and cognitive impairment due to #3: as per #3 patient will be trasfer to inpatient rehab.  7-systolic heart failure (EF 40-45%): continue current medication regimen; no signs of acute exacerbation.  Time spent on Discharge: 35 minutes  Signed: Pate Aylward 05/08/2011, 1:25 PM

## 2011-05-08 NOTE — Consult Note (Signed)
PM&R Consult Note  Pt is well known to me from initial rehab admission.  Was sent to [facility] for renal assessment and treatment set up.  Was near goals at the time of transfer, but now requiring more assistance with transfers and mobility and appears to have taken a step backward.  I examined the patient and spoke with her and her mother this am.  I am willing to bring her back to CIR for a brief stay to maximize her functional mobility an self-care. I would expect this stay to be fairly brief, perhaps 1 week.  Ivory Broad, MD

## 2011-05-08 NOTE — Progress Notes (Signed)
Pt was transferred to 4000. Report was called to the RN. All of the pt's belongings and medications were sent with her. Pt's family was made aware prior to pt being transferred.

## 2011-05-08 NOTE — Progress Notes (Signed)
Patient received at 1640 from unit 6700 alert and oriented to self and place. Slow to respond but appropriate . Skin intact small area to sacrum discolored skin intact. Bilateral feet edematous. Bilateral foot drop noted. Patient reports she is on her menstrual cycle . Oriented patient and mother to room and call bell system . Patient and mother verbalized understanding due to recent admission on unit. Patient and mother denied need for handouts. Abdomen distended soft. Patient and mother verbalized understanding of admission process . Continue with plan of care.                                                                                                                            Heather Sims

## 2011-05-08 NOTE — Progress Notes (Signed)
Plan is for pt to dc to CIR today. Heather Sims 05/08/2011 [phone] OR [phone]

## 2011-05-08 NOTE — PMR Pre-admission (Signed)
PMR Admission Coordinator Pre-Admission Assessment  Patient:  Heather Sims is an 21 y.o., female MRN:  [id number] DOB:  February 23, 1991 Height:  5\' 2"  (157.5 cm) Weight:  58 kg (127 lb 13.9 oz)  Insurance Information: SELF PAY  Medicaid Application Date:unknown (Probably started at previous CIR admit by SW)     Disability Application Date:unknown        Current Medical History:   Patient Admitting Diagnosis:  Anoxic BI/acute renal failure History of Present Illness: 21 year old right-handed female with recent diagnosis of hypertension as well as upper respiratory infection which she had been placed on Avelox. Admitted January 3 with sudden onset shortness of breath and cardiorespiratory failure with CPR in the field. She was pulseless for approximately 5 minutes and intubated in the emergency department. Underwent hypothermia protocol and suffered seizures upon rewarming. Cranial CT scan and MRI of the brain showed no acute changes. Echocardiogram with ejection fraction 40% and systolic function moderately reduced. TEE with diffuse hypokinesis ejection fraction 45%. Venous Doppler studies lower extremities were negative. EEG was minimal seizure activity. MRI of cervical thoracic lumbar spine unremarkable. Noted hemoglobin 8.1 and creatinine 2.31 upon admission she was transfused 3 units of packed red blood cells. Renal ultrasound was negative for hydronephrosis. Nephrology consulted felt renal insufficiency due to hypertension. Renal biopsy completed January 18 to complete workup of acute renal insufficiency with results pending. Post operative biopsy with perirenal hematoma and close monitoring with CBC that remained stable. Latest creatinine 1.95 felt to be stabilized. High dose Lasix had been discontinued with stabilizing renal function January 20. Patient had been on subcutaneous heparin for deep vein thrombosis prophylaxis was discontinued January 17 with renal biopsy completed January 18. Placed on  valproate 1000 mg every 12 hours per neurology services for seizure disorder. No further seizure activity has been reported. Patient continued to have bouts of myoclonus of the lower extremities improved after being placed on clonazepam. 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 Patient was ultimately admitted to inpatient rehabilitation services January 21 for comprehensive rehabilitation program. Patient has ongoing followup in regards to acute renal failure as well as her malignant hypertension. 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 Her creatinine gradually continued to worsen. Her Lasix had been discontinued on February 2. Followup renal services advised patient to be transferred to [facility] Dr.Nachman who is an expert in the field on glomerular disease. Patient was transferred to [facility] February 5 for further management regards to her renal function. At [facility] she was given a fluid challenge of 2 L over 24 hours after which patient made adequate urine output per old records. She did receive a renal sonogram that showed no structural uropathy and a small right peri-nephric hematoma compatible with post renal biopsy. A 2-D echocardiogram completed with ejection fraction of 50% and mild diastolic dysfunction. 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 Her latest creatinine at [facility] was 2.9 and followup once arrived to [facility] creatinine 3.2. No further workup was indicated this patient was transferred back to [facility] on February 13 for ongoing medical care and consideration of inpatient rehabilitation services to be resumed. She had been requiring moderate to max assist for activities of daily living and minimal assistance to ambulate 20 feet with a rolling walker for overall mobility and max assist for her safety and awareness  prior to her discharge to [facility]ospital. Physical medicine and rehabilitation was again consulted for followup and plan was to readmit back inpatient rehabilitation services for ongoing therapy and maximize her functional ability.  Patients Past Medical History:   Past Medical History  Diagnosis Date  . Pneumonia last 2 weeks    'walking pneumonia'  . Hypertension   . Seizures   . Chronic kidney disease   . Anemia    Family Medical History:  family history is not on file. Patients Current Diet: Cardiac  Prior Rehab/Hospitalizations: CIR 04/13/11-04/28/11  Medications   Current Medications: Current facility-administered medications:acetaminophen (TYLENOL) suppository 650 mg, 650 mg, Rectal, Q6H PRN, Eduard Clos, MD;  acetaminophen (TYLENOL) tablet 650 mg, 650 mg, Oral, Q6H PRN, Eduard Clos, MD;  albuterol (PROVENTIL HFA;VENTOLIN HFA) 108 (90 BASE) MCG/ACT inhaler 1 puff, 1 puff, Inhalation, Q4H PRN, Eduard Clos, MD amLODipine (NORVASC) tablet 10 mg, 10 mg, Oral, Daily, Eduard Clos, MD, 10 mg at 05/08/11 1006;  calcium acetate (PHOSLO) capsule 1,334 mg, 1,334 mg, Oral, TID WC, Eduard Clos, MD, 1,334 mg at 05/08/11 1308;  cholecalciferol (VITAMIN D) tablet 400 Units, 400 Units, Oral, BID, Eduard Clos, MD, 400 Units at 05/08/11 1007 clonazePAM (KLONOPIN) tablet 0.5 mg, 0.5 mg, Oral, Daily, Eduard Clos, MD, 0.5 mg at 05/08/11 1032;  cloNIDine (CATAPRES - Dosed in mg/24 hr) patch 0.3 mg, 0.3 mg, Transdermal, Weekly, Eduard Clos, MD, 0.3 mg at 05/06/11 2255;  darbepoetin (ARANESP) injection 100 mcg, 100 mcg, Subcutaneous, Q7 days, Eduard Clos, MD, 100 mcg at 05/07/11 2204 divalproex (DEPAKOTE) DR tablet 1,000 mg, 1,000 mg, Oral, BID, Eduard Clos, MD, 1,000 mg at 05/08/11 1007;  docusate sodium (COLACE) capsule 100 mg, 100 mg, Oral, BID, Eduard Clos, MD, 100 mg at 05/08/11 1007;  famotidine (PEPCID) tablet  20 mg, 20 mg, Oral, Daily, Eduard Clos, MD, 20 mg at 05/08/11 1003;  ferrous sulfate tablet 325 mg, 325 mg, Oral, TID, Eduard Clos, MD, 325 mg at 05/08/11 1003 heparin injection 5,000 Units, 5,000 Units, Subcutaneous, Q8H, Eduard Clos, MD, 5,000 Units at 05/07/11 2205;  isosorbide dinitrate (ISORDIL) tablet 20 mg, 20 mg, Oral, TID, Eduard Clos, MD, 20 mg at 05/08/11 1003;  labetalol (NORMODYNE,TRANDATE) injection 10 mg, 10 mg, Intravenous, Q2H PRN, Eduard Clos, MD;  metoprolol (LOPRESSOR) tablet 100 mg, 100 mg, Oral, BID, Eduard Clos, MD, 100 mg at 05/08/11 1031 metoprolol tartrate (LOPRESSOR) tablet 25 mg, 25 mg, Oral, BID, Eduard Clos, MD, 25 mg at 05/08/11 1006;  minoxidil (LONITEN) tablet 10 mg, 10 mg, Oral, QPM, Eduard Clos, MD, 10 mg at 05/07/11 1802;  ondansetron (ZOFRAN) injection 4 mg, 4 mg, Intravenous, Q6H PRN, Eduard Clos, MD;  ondansetron (ZOFRAN) tablet 4 mg, 4 mg, Oral, Q6H PRN, Eduard Clos, MD polyethylene glycol (MIRALAX / GLYCOLAX) packet 17 g, 17 g, Oral, Daily, Eduard Clos, MD, 17 g at 05/08/11 1003;  pregabalin (LYRICA) capsule 25 mg, 25 mg, Oral, Daily, Eduard Clos, MD, 25 mg at 05/08/11 1007;  sodium chloride 0.9 % injection 3 mL, 3 mL, Intravenous, Q12H, Eduard Clos, MD, 3 mL at 05/08/11 1003  Precautions/Special Needs:  Precautions/Special Needs: Behavior Behavior Precautions: Cognitive impairments post anoxic BI Conditions/Impairments that will impact rehabilitation: Cognition;Balance (anoxic BI) Cognition Conditions/Impairments: anoxic BI  Additional Precautions/Restrictions: Precautions Precautions: Fall Restrictions Weight Bearing Restrictions: No  Therapy Assessments Physical Therapy: Precautions Precautions: Fall Home Living Lives With: Family Receives Help From:  Family Type of Home: House Home Layout: One level Home Access: Stairs to  enter [employer] of Steps: 5-6 Bathroom Shower/Tub: Engineer, manufacturing systems: Standard Bathroom Accessibility: Yes How Accessible: Accessible via wheelchair;Accessible via walker Home Adaptive Equipment: None Prior Function Level of Independence: Needs assistance with ADLs;Needs assistance with gait;Needs assistance with tranfers Able to Take Stairs?: No Driving: No Vocation: Student Comments: Patient was a student prior to original admission date Coordination Gross Motor Movements are Fluid and Coordinated: No Fine Motor Movements are Fluid and Coordinated: No Coordination and Movement Description: myoclonic movements B UEs and LEs Heel Shin Test: Poor coordination bilaterally unable to perform due to pain in feet.  Occupational Therapy: Precautions Precautions: Fall Home Living Lives With: Family Receives Help From: Family Type of Home: House Home Layout: One level Home Access: Stairs to enter Secretary/administrator of Steps: 5-6 Bathroom Shower/Tub: Engineer, manufacturing systems: Standard Bathroom Accessibility: Yes How Accessible: Accessible via wheelchair;Accessible via walker Home Adaptive Equipment: None Prior Function Level of Independence: Needs assistance with ADLs;Needs assistance with gait;Needs assistance with tranfers Able to Take Stairs?: No Driving: No Vocation: Student Comments: Patient was a student prior to original admission date Coordination Gross Motor Movements are Fluid and Coordinated: No Fine Motor Movements are Fluid and Coordinated: No Coordination and Movement Description: myoclonic movements B UEs and LEs Heel Shin Test: Poor coordination bilaterally unable to perform due to pain in feet. Restrictions Weight Bearing Restrictions: No  ADL Eating/Feeding: Performed;Minimal assistance Where Assessed - Eating/Feeding: Chair Grooming: Performed;Wash/dry face;Teeth care;Minimal assistance Where Assessed - Grooming:  Sitting, chair Upper Body Bathing: Simulated;Moderate assistance Where Assessed - Upper Body Bathing: Sitting, chair Lower Body Bathing: Simulated;Maximal assistance Where Assessed - Lower Body Bathing: Sitting, chair;Sit to stand from chair Upper Body Dressing: Performed;Moderate assistance Where Assessed - Upper Body Dressing: Sitting, chair Lower Body Dressing: Performed;Moderate assistance Where Assessed - Lower Body Dressing: Sitting, chair;Sit to stand from chair Toilet Transfer: Not assessed ADL Comments: Pt with lethargy, difficulty initially arousing pt to the point of participation.  Pt with decreased memory, asking same question multiple time ie:  why are you bothering me?  what do you want me to do?  Pt highly distractible with multiple interruptions of other staff members.  Pt noted to have good thoroughness for oral care, but decreased for face washing.  Ataxia of UEs leading to some spillage with drinking.  SLP Recommendations: Equipment Recommended: Defer to next venue    Prior Function: Level of Independence: Needs assistance with ADLs;Needs assistance with gait;Needs assistance with tranfers Able to Take Stairs?: No Driving: No Vocation: Student Comments: Patient was a student prior to original admission date ADL Eating/Feeding: Performed;Minimal assistance Where Assessed - Eating/Feeding: Chair Grooming: Performed;Wash/dry face;Teeth care;Minimal assistance Where Assessed - Grooming: Sitting, chair Upper Body Bathing: Simulated;Moderate assistance Where Assessed - Upper Body Bathing: Sitting, chair Lower Body Bathing: Simulated;Maximal assistance Where Assessed - Lower Body Bathing: Sitting, chair;Sit to stand from chair Upper Body Dressing: Performed;Moderate assistance Where Assessed - Upper Body Dressing: Sitting, chair Lower Body Dressing: Performed;Moderate assistance Where Assessed - Lower Body Dressing: Sitting, chair;Sit to stand from chair Toilet  Transfer: Not assessed ADL Comments: Pt with lethargy, difficulty initially arousing pt to the point of participation.  Pt with decreased memory, asking same question multiple time ie:  why are you bothering me?  what do you want me to do?  Pt highly distractible with multiple interruptions of other staff members.  Pt noted to have good thoroughness for  oral care, but decreased for face washing.  Ataxia of UEs leading to some spillage with drinking.   Prior Activity Level: Community (5-7x/wk): Was a student prior to admission  ADLs/Mobility: ADL Eating/Feeding: Performed;Minimal assistance Where Assessed - Eating/Feeding: Chair Grooming: Performed;Wash/dry face;Teeth care;Minimal assistance Where Assessed - Grooming: Sitting, chair Upper Body Bathing: Simulated;Moderate assistance Where Assessed - Upper Body Bathing: Sitting, chair Lower Body Bathing: Simulated;Maximal assistance Where Assessed - Lower Body Bathing: Sitting, chair;Sit to stand from chair Upper Body Dressing: Performed;Moderate assistance Where Assessed - Upper Body Dressing: Sitting, chair Lower Body Dressing: Performed;Moderate assistance Where Assessed - Lower Body Dressing: Sitting, chair;Sit to stand from chair Toilet Transfer: Not assessed ADL Comments: Pt with lethargy, difficulty initially arousing pt to the point of participation.  Pt with decreased memory, asking same question multiple time ie:  why are you bothering me?  what do you want me to do?  Pt highly distractible with multiple interruptions of other staff members.  Pt noted to have good thoroughness for oral care, but decreased for face washing.  Ataxia of UEs leading to some spillage with drinking.  Bed Mobility Bed Mobility: No Supine to Sit: 5: Supervision;HOB flat Supine to Sit Details (indicate cue type and reason): Pt determined to perform task independently. Required lots of extra time.  Pt uses bilateral UE to assist her Lower extremities  pulling  her legs into hip and knee flexion. Ataxic movement of bilateral LE.   Sitting - Scoot to Edge of Bed: 5: Supervision Sitting - Scoot to [place] of Bed Details (indicate cue type and reason): Again pt required extra time to complete task.  Would not allow PT to assist.   Transfers Transfers: Yes Sit to Stand: 3: Mod assist;From chair/3-in-1;With upper extremity assist Sit to Stand Details (indicate cue type and reason): Pt insist on initiating standing without assistance.  Pt required assistance to stabilize pt  and assist  with anterior wt shift to achieve terminal knee extension in standing.      Stand to Sit: 4: Min assist;To chair/3-in-1;With armrests Stand to Sit Details: Pt required verbal and tactile cues for technique to back up to chair, hand placement and controlled descent as pt collapsed into the chair.  Stand Pivot Transfers: 2: Max Actuary Details (indicate cue type and reason): Assist to manage walker.  Assist to stabilize pt during each LE advance.  Lots of attaxia in LEs, Knees buckled throughout the transfer and pt did not deomonstrate the Upper body strength to stabilize herself. Attaxia improved with verbal cueing for pt to breath and relax, let your legs calm down.   Ambulation/Gait Ambulation/Gait: Yes Ambulation/Gait Assistance: 1: +1 Total assist (second person with chair.  ) Ambulation/Gait Assistance Details (indicate cue type and reason): Assist to stabilze pt due to impaired general strength and impaired balance.  Gait difficulty magnified by pt's c/o severe pain in Bilater Lower extremities secondary to neuropathy.  Pt knee buckled throughout and pt had difficulty managing the walker.  Ambulation Distance (Feet): 4 Feet Assistive device: Rolling walker Gait Pattern: Festinating;Ataxic;Decreased dorsiflexion - right;Decreased dorsiflexion - left;Scissoring (pt presents with bilateral foot drop.) Stairs: No Wheelchair Mobility Wheelchair Mobility:  No Posture/Postural Control Posture/Postural Control: Postural limitations Postural Limitations: kyphotic posture.  Balance Balance Assessed: No  Home Assistive Devices/Equipment:  Home Assistive Devices/Equipment Home Assistive Devices/Equipment: None  Discharge Planning:  Living Arrangements: Family members Support Systems: Family members;Parent Assistance Needed: will need 24 hour assistance once discharged Do you have any problems  obtaining your medications?: Yes (Describe) Type of Residence: Private residence Home Care Services: No Patient expects to be discharged to:: home with mother Expected Discharge Date:  (to be determined) Case Management Consult Needed: Yes (Comment)    Previous Home Environment:  Living Arrangements: Family members Support Systems: Family members;Parent Assistance Needed: will need 24 hour assistance once discharged Do you have any problems obtaining your medications?: Yes (Describe) Type of Residence: Private residence Home Care Services: No Patient expects to be discharged to:: home with mother Expected Discharge Date:  (to be determined)  Discharge Living Setting:  Plans for Discharge Living Setting: House;Lives with (comment) (mother) Discharge Living Setting Number of Levels: 1 Discharge Living Setting Number of Steps: 5-6 Discharge Living Setting is Bedroom on Main Floor?: Yes Discharge Living Setting is Bathroom on Main Floor?: Yes  Social/Family/Support Systems:  Patient Roles:  (daughter/student) Discharge Plan Discussed with Primary Caregiver: Yes Is Caregiver In Agreement with Plan?: Yes Does Caregiver/Family have Issues with Lodging/Transportation while Pt is in Rehab?: No  Goals/Additional Needs:  Patient/Family Goal for Rehab: to be as independent as possible Cultural Considerations: none Dietary Needs: Heart Diet Equipment Needs: to be determined Pt/Family Agrees to Admission and willing to participate: Yes Program Orientation  Provided & Reviewed with Pt/Caregiver Including Roles  & Responsibilities: Yes  Preadmission Screen Completed By:  Oletta Darter, 05/08/2011 12:25 PM  Patient's condition:  Please see physician update to information in consult dated 04/07/11 and f/u note dated 05/08/11 @ 0915.  Preadmission Screen Competed ZO:XWRUE Thomas,RN on 05/08/11 @ 12:45pm .  Discussed status with Dr. Riley Kill on2/15/13 at 0915  and received telephone approval for admission today.  Admission Coordinator:  Oletta Darter, time 12:45/Date2/15/13

## 2011-05-08 NOTE — H&P (Signed)
Physical Medicine and Rehabilitation Admission H&P  Heather Sims is an 22 y.o. female.   No chief complaint on file. : HPI: 21 year old right-handed female with recent diagnosis of hypertension as well as upper respiratory infection which she had been placed on Avelox. Admitted January 3 with sudden onset shortness of breath and cardiorespiratory failure with CPR in the field. She was pulseless for approximately 5 minutes and intubated in the emergency department. Underwent hypothermia protocol and suffered seizures upon rewarming. Cranial CT scan and MRI of the brain showed no acute changes. Echocardiogram with ejection fraction 40% and systolic function moderately reduced. TEE with diffuse hypokinesis ejection fraction 45%. Venous Doppler studies lower extremities were negative. EEG was minimal seizure activity. MRI of cervical thoracic lumbar spine unremarkable. Noted hemoglobin 8.1 and creatinine 2.31 upon admission she was transfused 3 units of packed red blood cells. Renal ultrasound was negative for hydronephrosis. Nephrology consulted felt renal insufficiency due to hypertension. Renal biopsy completed January 18 to complete workup of acute renal insufficiency with results pending. Post operative biopsy with perirenal hematoma and close monitoring with CBC that remained stable. Latest creatinine 1.95 felt to be stabilized. High dose Lasix had been discontinued with stabilizing renal function January 20. Patient had been on subcutaneous heparin for deep vein thrombosis prophylaxis was discontinued January 17 with renal biopsy completed January 18. Placed on valproate 1000 mg every 12 hours per neurology services for seizure disorder. No further seizure activity has been reported. Patient continued to have bouts of myoclonus of the lower extremities improved after being placed on clonazepam. 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 Patient  was ultimately admitted to inpatient rehabilitation services January 21 for comprehensive rehabilitation program. Patient has ongoing followup in regards to acute renal failure as well as her malignant hypertension. 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 Her creatinine gradually continued to worsen. Her Lasix had been discontinued on February 2. Followup renal services advised patient to be transferred to [facility] Dr.Nachman who is an expert in the field on glomerular disease. Patient was transferred to [facility] February 5 for further management regards to her renal function. At [facility] she was given a fluid challenge of 2 L over 24 hours after which patient made adequate urine output per old records. She did receive a renal sonogram that showed no structural uropathy and a small right peri-nephric hematoma compatible with post renal biopsy. A 2-D echocardiogram completed with ejection fraction of 50% and mild diastolic dysfunction. 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 Her latest creatinine at [facility] was 2.9 and followup once arrived to [facility] creatinine 3.2. No further workup was indicated this patient was transferred back to [facility] on February 13 for ongoing medical care and consideration of inpatient rehabilitation services to be resumed. She had been requiring moderate to max assist for activities of daily living and minimal assistance to ambulate 20 feet with a rolling walker for overall mobility and max assist for her safety and awareness prior to her discharge to [facility]. Physical medicine and rehabilitation was again consulted for followup and plan was to readmit back inpatient rehabilitation services for ongoing therapy and maximize her overall mobility.   Review of Systems  Constitutional: Positive for malaise/fatigue.  Respiratory: Negative for cough.     Musculoskeletal: Positive for myalgias.  Neurological: Negative for dizziness.  Psychiatric/Behavioral: Positive for memory loss.  The patient has insomnia.   All other systems reviewed and are negative.   Past Medical History  Diagnosis Date  . Pneumonia last 2 weeks    'walking pneumonia'  . Hypertension   . Seizures   . Chronic kidney disease   . Anemia    Past Surgical History  Procedure Date  . Renal biopsy    History reviewed. No pertinent family history. Social History:  reports that she has never smoked. She does not have any smokeless tobacco history on file. She reports that she does not drink alcohol or use illicit drugs. Allergies: No Known Allergies Medications Prior to Admission  Medication Dose Route Frequency Provider Last Rate Last Dose  . acetaminophen (TYLENOL) tablet 650 mg  650 mg Oral Q6H PRN Eduard Clos, MD       Or  . acetaminophen (TYLENOL) suppository 650 mg  650 mg Rectal Q6H PRN Eduard Clos, MD      . albuterol (PROVENTIL HFA;VENTOLIN HFA) 108 (90 BASE) MCG/ACT inhaler 1 puff  1 puff Inhalation Q4H PRN Eduard Clos, MD      . amLODipine (NORVASC) tablet 10 mg  10 mg Oral Daily Eduard Clos, MD   10 mg at 05/07/11 1200  . calcium acetate (PHOSLO) capsule 1,334 mg  1,334 mg Oral TID WC Eduard Clos, MD   1,334 mg at 05/07/11 1803  . cholecalciferol (VITAMIN D) tablet 400 Units  400 Units Oral BID Eduard Clos, MD   400 Units at 05/07/11 2203  . clonazePAM (KLONOPIN) tablet 0.5 mg  0.5 mg Oral Daily Eduard Clos, MD   0.5 mg at 05/07/11 1210  . cloNIDine (CATAPRES - Dosed in mg/24 hr) patch 0.3 mg  0.3 mg Transdermal Weekly Eduard Clos, MD   0.3 mg at 05/06/11 2255  . darbepoetin (ARANESP) injection 100 mcg  100 mcg Subcutaneous Q7 days Eduard Clos, MD   100 mcg at 05/07/11 2204  . divalproex (DEPAKOTE) DR tablet 1,000 mg  1,000 mg Oral BID Eduard Clos, MD   1,000 mg at  05/07/11 2204  . docusate sodium (COLACE) capsule 100 mg  100 mg Oral BID Eduard Clos, MD   100 mg at 05/07/11 2203  . famotidine (PEPCID) tablet 20 mg  20 mg Oral Daily Eduard Clos, MD   20 mg at 05/07/11 1204  . ferrous sulfate tablet 325 mg  325 mg Oral TID Eduard Clos, MD   325 mg at 05/07/11 2203  . heparin injection 5,000 Units  5,000 Units Subcutaneous Q8H Eduard Clos, MD   5,000 Units at 05/07/11 2205  . isosorbide dinitrate (ISORDIL) tablet 20 mg  20 mg Oral TID Eduard Clos, MD   20 mg at 05/07/11 2204  . labetalol (NORMODYNE,TRANDATE) injection 10 mg  10 mg Intravenous Q2H PRN Eduard Clos, MD      . metoprolol (LOPRESSOR) tablet 100 mg  100 mg Oral BID Eduard Clos, MD   100 mg at 05/07/11 2204  . metoprolol tartrate (LOPRESSOR) tablet 25 mg  25 mg Oral BID Eduard Clos, MD   25 mg at 05/07/11 2204  . minoxidil (LONITEN) tablet 10 mg  10 mg Oral QPM Eduard Clos, MD   10 mg at 05/07/11 1802  . ondansetron (ZOFRAN) tablet 4 mg  4 mg Oral Q6H PRN Eduard Clos, MD       Or  . ondansetron [facility]) injection  4 mg  4 mg Intravenous Q6H PRN Eduard Clos, MD      . polyethylene glycol (MIRALAX / GLYCOLAX) packet 17 g  17 g Oral Daily Eduard Clos, MD   17 g at 05/07/11 1155  . pregabalin (LYRICA) capsule 25 mg  25 mg Oral Daily Eduard Clos, MD   25 mg at 05/07/11 1205  . sodium chloride 0.9 % injection 3 mL  3 mL Intravenous Q12H Eduard Clos, MD   3 mL at 05/07/11 2206  . DISCONTD: heparin injection 5,000 Units  5,000 Units Subcutaneous Q8H Eduard Clos, MD       Medications Prior to Admission  Medication Sig Dispense Refill  . albuterol (PROVENTIL HFA;VENTOLIN HFA) 108 (90 BASE) MCG/ACT inhaler Inhale 1 puff into the lungs every 4 (four) hours as needed. For shortness of breath.         Home: Home Living Lives With: Family Receives Help From: Family     Functional History: Prior Function Level of Independence: Needs assistance with ADLs;Needs assistance with gait;Needs assistance with tranfers Able to Take Stairs?: No Driving: No Vocation: Student  Functional Status:  Mobility: Bed Mobility Bed Mobility: Yes Supine to Sit: 5: Supervision;HOB flat Supine to Sit Details (indicate cue type and reason): Pt determined to perform task independently. Required lots of extra time.  Pt uses bilateral UE to assist her Lower extremities  pulling her legs into hip and knee flexion. Ataxic movement of bilateral LE.   Sitting - Scoot to Edge of Bed: 5: Supervision Sitting - Scoot to [place] of Bed Details (indicate cue type and reason): Again pt required extra time to complete task.  Would not allow PT to assist.   Transfers Transfers: Yes Sit to Stand: From bed;3: Mod assist;With upper extremity assist (Pt 70%) Sit to Stand Details (indicate cue type and reason): Pt insist on initiating standing without assistance.  Pt required assistance to stabilize pt  and assist  with anterior wt shift to achieve terminal knee extension in standing.      Stand to Sit: 4: Min assist;With upper extremity assist;To chair/3-in-1 Stand to Sit Details: Pt required verbal and tactile cues for technique to back up to chair, hand placement and controlled descent as pt collapsed into the chair.  Stand Pivot Transfers: 2: Max Actuary Details (indicate cue type and reason): Assist to manage walker.  Assist to stabilize pt during each LE advance.  Lots of attaxia in LEs, Knees buckled throughout the transfer and pt did not deomonstrate the Upper body strength to stabilize herself. Attaxia improved with verbal cueing for pt to breath and relax, let your legs calm down.   Ambulation/Gait Ambulation/Gait: Yes Ambulation/Gait Assistance: 1: +1 Total assist (second person with chair.  ) Ambulation/Gait Assistance Details (indicate cue type and reason): Assist to  stabilze pt due to impaired general strength and impaired balance.  Gait difficulty magnified by pt's c/o severe pain in Bilater Lower extremities secondary to neuropathy.  Pt knee buckled throughout and pt had difficulty managing the walker.  Ambulation Distance (Feet): 4 Feet Assistive device: Rolling walker Gait Pattern: Festinating;Ataxic;Decreased dorsiflexion - right;Decreased dorsiflexion - left;Scissoring (pt presents with bilateral foot drop.) Stairs: No Wheelchair Mobility Wheelchair Mobility: No  ADL:    Cognition: Cognition Arousal/Alertness: Awake/alert Orientation Level: Oriented to person;Oriented to place;Oriented to situation;Disoriented to time Cognition Arousal/Alertness: Awake/alert Overall Cognitive Status: Appears within functional limits for tasks assessed Orientation Level: Oriented to person;Oriented to place;Oriented to  situation;Disoriented to time   Blood pressure 133/81, pulse 85, temperature 98 F (36.7 C), temperature source Oral, resp. rate 18, height 5\' 2"  (1.575 m), weight 58 kg (127 lb 13.9 oz), last menstrual period 03/24/2011, SpO2 100.00%. Physical Exam  Vitals reviewed. Constitutional: She appears well-developed.  HENT:  Head: Normocephalic.  Neck: Normal range of motion. Neck supple. No thyromegaly present.  Cardiovascular: Normal rate and regular rhythm.   Pulmonary/Chest: Breath sounds normal. She has no wheezes.  Abdominal: She exhibits no distension. There is no tenderness.  Musculoskeletal: She exhibits no edema.  Neurological:       Lethargic but arousable. She would state her name. It is difficult to keep her attention to tasks during exam. Follow one-step commands. IPO phonic and dysarthric at times. She moves all fours but inconsistent. Strength grossly 3-4 or 5. She has 3-4 beats of clonus of either ankle. Reflexes were hyperactive at 3+.  Psychiatric:       Mood is flat and distractible    Results for orders placed during the  hospital encounter of 05/06/11 (from the past 48 hour(s))  COMPREHENSIVE METABOLIC PANEL     Status: Abnormal   Collection Time   05/06/11 10:08 PM      Component Value Range Comment   Sodium 141  135 - 145 (mEq/L)    Potassium 4.1  3.5 - 5.1 (mEq/L)    Chloride 107  96 - 112 (mEq/L)    CO2 22  19 - 32 (mEq/L)    Glucose, Bld 88  70 - 99 (mg/dL)    BUN 50 (*) 6 - 23 (mg/dL)    Creatinine, Ser 8.29 (*) 0.50 - 1.10 (mg/dL)    Calcium 8.5  8.4 - 10.5 (mg/dL)    Total Protein 5.4 (*) 6.0 - 8.3 (g/dL)    Albumin 1.9 (*) 3.5 - 5.2 (g/dL)    AST 14  0 - 37 (U/L)    ALT <5  0 - 35 (U/L)    Alkaline Phosphatase 42  39 - 117 (U/L)    Total Bilirubin 0.2 (*) 0.3 - 1.2 (mg/dL)    GFR calc non Af Amer 19 (*) >90 (mL/min)    GFR calc Af Amer 22 (*) >90 (mL/min)   CBC     Status: Abnormal   Collection Time   05/06/11 10:08 PM      Component Value Range Comment   WBC 13.0 (*) 4.0 - 10.5 (K/uL)    RBC 2.86 (*) 3.87 - 5.11 (MIL/uL)    Hemoglobin 8.3 (*) 12.0 - 15.0 (g/dL)    HCT 56.2 (*) 13.0 - 46.0 (%)    MCV 92.7  78.0 - 100.0 (fL)    MCH 29.0  26.0 - 34.0 (pg)    MCHC 31.3  30.0 - 36.0 (g/dL)    RDW 86.5 (*) 78.4 - 15.5 (%)    Platelets 123 (*) 150 - 400 (K/uL)   TSH     Status: Normal   Collection Time   05/06/11 10:08 PM      Component Value Range Comment   TSH 4.224  0.350 - 4.500 (uIU/mL)   PHOSPHORUS     Status: Abnormal   Collection Time   05/06/11 10:08 PM      Component Value Range Comment   Phosphorus 6.2 (*) 2.3 - 4.6 (mg/dL)   MAGNESIUM     Status: Abnormal   Collection Time   05/06/11 10:08 PM      Component Value Range Comment  Magnesium 2.6 (*) 1.5 - 2.5 (mg/dL)   BASIC METABOLIC PANEL     Status: Abnormal   Collection Time   05/07/11  5:45 AM      Component Value Range Comment   Sodium 140  135 - 145 (mEq/L)    Potassium 3.9  3.5 - 5.1 (mEq/L)    Chloride 107  96 - 112 (mEq/L)    CO2 22  19 - 32 (mEq/L)    Glucose, Bld 88  70 - 99 (mg/dL)    BUN 50 (*) 6 - 23  (mg/dL)    Creatinine, Ser 4.09 (*) 0.50 - 1.10 (mg/dL)    Calcium 8.3 (*) 8.4 - 10.5 (mg/dL)    GFR calc non Af Amer 20 (*) >90 (mL/min)    GFR calc Af Amer 23 (*) >90 (mL/min)   CBC     Status: Abnormal   Collection Time   05/07/11  5:45 AM      Component Value Range Comment   WBC 12.8 (*) 4.0 - 10.5 (K/uL)    RBC 2.66 (*) 3.87 - 5.11 (MIL/uL)    Hemoglobin 7.9 (*) 12.0 - 15.0 (g/dL)    HCT 81.1 (*) 91.4 - 46.0 (%)    MCV 92.9  78.0 - 100.0 (fL)    MCH 29.7  26.0 - 34.0 (pg)    MCHC 32.0  30.0 - 36.0 (g/dL)    RDW 78.2 (*) 95.6 - 15.5 (%)    Platelets 118 (*) 150 - 400 (K/uL) PLATELET COUNT CONFIRMED BY SMEAR  URINALYSIS, ROUTINE W REFLEX MICROSCOPIC     Status: Abnormal   Collection Time   05/07/11  6:23 PM      Component Value Range Comment   Color, Urine YELLOW  YELLOW     APPearance CLOUDY (*) CLEAR     Specific Gravity, Urine 1.017  1.005 - 1.030     pH 5.0  5.0 - 8.0     Glucose, UA NEGATIVE  NEGATIVE (mg/dL)    Hgb urine dipstick LARGE (*) NEGATIVE     Bilirubin Urine NEGATIVE  NEGATIVE     Ketones, ur NEGATIVE  NEGATIVE (mg/dL)    Protein, ur >213 (*) NEGATIVE (mg/dL)    Urobilinogen, UA 0.2  0.0 - 1.0 (mg/dL)    Nitrite NEGATIVE  NEGATIVE     Leukocytes, UA MODERATE (*) NEGATIVE    URINE MICROSCOPIC-ADD ON     Status: Abnormal   Collection Time   05/07/11  6:23 PM      Component Value Range Comment   Squamous Epithelial / LPF RARE  RARE     WBC, UA 7-10  <3 (WBC/hpf)    RBC / HPF 21-50  <3 (RBC/hpf)    Bacteria, UA FEW (*) RARE     Casts HYALINE CASTS (*) NEGATIVE  GRANULAR CAST   Urine-Other AMORPHOUS URATES/PHOSPHATES      No results found.  Post Admission Physician Evaluation: 1. Functional deficits secondary  to anoxic brain injury. 2. Patient is admitted to receive collaborative, interdisciplinary care between the physiatrist, rehab nursing staff, and therapy team. 3. Patient's level of medical complexity and substantial therapy needs in context of that  medical necessity cannot be provided at a lesser intensity of care such as a SNF. 4. Patient has experienced substantial functional loss from his/her baseline which was documented above under the "Functional History" and "Functional Status" headings.  Judging by the patient's diagnosis, physical exam, and functional history, the patient has potential for functional  progress which will result in measurable gains while on inpatient rehab.  These gains will be of substantial and practical use upon discharge  in facilitating mobility and self-care at the household level. 5. Physiatrist will provide 24 hour management of medical needs as well as oversight of the therapy plan/treatment and provide guidance as appropriate regarding the interaction of the two. 6. 24 hour rehab nursing will assist with bladder management, bowel management, safety, skin/wound care, disease management, medication administration, pain management and patient education  and help integrate therapy concepts, techniques,education, etc. 7. PT will assess and treat for:  Functional mobility, gait, neuromuscular reeducation, safety, family education.  Goals are: Supervision to minimal assistance with basic gait and wheelchair mobility. 8. OT will assess and treat for: Upper extremity strength, ADLs, neuromuscular reeducation, functional mobility.   Goals are: Supervision to minimal assistance/occasional moderate assistance. 9. SLP will assess and treat for: Speech intelligibility/cognition.  Goals are: Minimal to moderate assistance. 10. Case Management and Social Worker will assess and treat for psychological issues and discharge planning. 11. Team conference will be held weekly to assess progress toward goals and to determine barriers to discharge. 12.  Patient will receive at least 3 hours of therapy per day at least 5 days per week. 13. ELOS and Prognosis: 7-10 days good  Patient is well known to the rehabilitation service. She has  become generally deconditioned since her recent  admission to [facility] for renal workup and assessment. Patient needs further therapy to return to levels at which she was reaching at the end of her initial rehabilitation stay.  Medical Problem List and Plan: 1. 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 DVT Prophylaxis/Anticoagulation: Subcutaneous heparin. Monitor platelet counts and any signs of bleeding 3. seizure disorder. Depakote 1000 mg twice a day. Monitor for any seizure activity 4. Renal failure. Followup [facility]. Weigh patient daily. Followup chemistries routinely. 5. Hypertension. Norvasc 10 mg daily, clonidine patch 0.3 mg weekly, Isordil 10 mg 3 times daily, Lopressor twice a day, minoxidil 10 mg daily. Monitor closely with increased activity. Patient's blood pressures have been labile. 6. Anemia. Continue iron supplement. Followup CBC  Ivory Broad, MD

## 2011-05-08 NOTE — Evaluation (Signed)
Occupational Therapy Evaluation Patient Details Name: Heather Sims MRN: [id number] DOB: November 29, 1990 Today's Date: 05/08/2011  Problem List:  Patient Active Problem List  Diagnoses  . AKI (acute kidney injury)  . Acute heart failure  . Hypoxemia  . Pneumonia  . Anemia  . Hypertension  . ? Viral Cardiomyopathy  . Anoxic brain injury  . Renal failure (ARF), acute on chronic  . Seizures    Past Medical History:  Past Medical History  Diagnosis Date  . Pneumonia last 2 weeks    'walking pneumonia'  . Hypertension   . Seizures   . Chronic kidney disease   . Anemia    Past Surgical History:  Past Surgical History  Procedure Date  . Renal biopsy     OT Assessment/Plan/Recommendation OT Assessment Clinical Impression Statement: Pt is a 21 year old woman who returns to [facility] after stay at [facility].  Pt was orginally admitted with cardiac arrest, hypoxia, and seizures with HTN and renal failure and anemia.   Pt could benefit from return to CIR for rehab prior to return home.  Will follow acutely. OT Recommendation/Assessment: Patient will need skilled OT in the acute care venue OT Problem List: Decreased strength;Decreased activity tolerance;Impaired balance (sitting and/or standing);Decreased cognition;Decreased coordination;Decreased knowledge of use of DME or AE;Impaired UE functional use Barriers to Discharge: None OT Therapy Diagnosis : Generalized weakness;Cognitive deficits;Ataxia OT Plan OT Frequency: Min 2X/week OT Treatment/Interventions: Self-care/ADL training;Therapeutic activities;Therapeutic exercise;DME and/or AE instruction;Cognitive remediation/compensation;Patient/family education;Balance training OT Recommendation Recommendations for Other Services: Rehab consult Follow Up Recommendations: Inpatient Rehab Equipment Recommended: Defer to next venue Individuals Consulted Consulted and Agree with Results and Recommendations: Patient OT Goals Acute Rehab OT  Goals OT Goal Formulation: With patient Time For Goal Achievement: 7 days ADL Goals Pt Will Perform Grooming: with min assist;Sitting at sink ADL Goal: Grooming - Progress: Goal set today Pt Will Perform Upper Body Bathing: Sitting at sink;with min assist ADL Goal: Upper Body Bathing - Progress: Goal set today Pt Will Perform Lower Body Bathing: Sitting at sink;Sit to stand from chair;with mod assist ADL Goal: Lower Body Bathing - Progress: Goal set today Pt Will Perform Upper Body Dressing: with supervision;with set-up;Sitting, bed ADL Goal: Upper Body Dressing - Progress: Goal set today Pt Will Perform Lower Body Dressing: Sit to stand from bed;Sitting, bed;with mod assist ADL Goal: Lower Body Dressing - Progress: Goal set today Pt Will Transfer to Toilet: with min assist;Regular height toilet;Ambulation ADL Goal: Toilet Transfer - Progress: Goal set today Pt Will Perform Toileting - Clothing Manipulation: Standing;with mod assist ADL Goal: Toileting - Clothing Manipulation - Progress: Goal set today Pt Will Perform Toileting - Hygiene: with supervision;with set-up;Sitting on 3-in-1 or toilet ADL Goal: Toileting - Hygiene - Progress: Goal set today  OT Evaluation Precautions/Restrictions  Precautions Precautions: Fall Restrictions Weight Bearing Restrictions: No Prior Functioning Home Living Lives With: Family Receives Help From: Family Prior Function Level of Independence: Needs assistance with ADLs;Needs assistance with gait;Needs assistance with tranfers Vocation: Student ADL ADL Eating/Feeding: Performed;Minimal assistance Where Assessed - Eating/Feeding: Chair Grooming: Performed;Wash/dry face;Teeth care;Minimal assistance Where Assessed - Grooming: Sitting, chair Upper Body Bathing: Simulated;Moderate assistance Where Assessed - Upper Body Bathing: Sitting, chair Lower Body Bathing: Simulated;Maximal assistance Where Assessed - Lower Body Bathing: Sitting, chair;Sit  to stand from chair Upper Body Dressing: Performed;Moderate assistance Where Assessed - Upper Body Dressing: Sitting, chair Lower Body Dressing: Performed;Moderate assistance Where Assessed - Lower Body Dressing: Sitting, chair;Sit to stand from chair Toilet Transfer: Not  assessed ADL Comments: Pt with lethargy, difficulty initially arousing pt to the point of participation.  Pt with decreased memory, asking same question multiple time ie:  why are you bothering me?  what do you want me to do?  Pt highly distractible with multiple interruptions of other staff members.  Pt noted to have good thoroughness for oral care, but decreased for face washing.  Ataxia of UEs leading to some spillage with drinking. Vision/Perception  Vision - History Baseline Vision: No visual deficits Patient Visual Report: No change from baseline Cognition Cognition Arousal/Alertness: Lethargic Overall Cognitive Status: Impaired Orientation Level: Oriented to person;Oriented to place;Disoriented to situation Sensation/Coordination Sensation Light Touch: Appears Intact Hot/Cold: Appears Intact Proprioception: Impaired by gross assessment (difficult to assess due to cognition and ataxia) Coordination Gross Motor Movements are Fluid and Coordinated: No Fine Motor Movements are Fluid and Coordinated: No Coordination and Movement Description: myoclonic movements B UEs and LEs Extremity Assessment RUE Assessment RUE Assessment: Exceptions to [facility] RUE Strength RUE Overall Strength: Deficits (strength grossly 3+/5) LUE Assessment LUE Assessment: Exceptions to [facility] (strength grossly 3+/5) LUE Strength LUE Overall Strength: Deficits Mobility  Bed Mobility Bed Mobility: No Transfers Transfers: Yes Sit to Stand: 3: Mod assist;From chair/3-in-1;With upper extremity assist Stand to Sit: 4: Min assist;To chair/3-in-1;With armrests End of Session OT - End of Session Equipment Utilized During Treatment: Gait  belt Activity Tolerance: Patient tolerated treatment well Patient left: in chair;Other (comment) (student nurse present) Nurse Communication: Other (comment) (requested student nurse make sure pt has call button) General Behavior During Session: Flat affect Cognition: Impaired   [name] 05/08/2011, 10:38 AM [phone]

## 2011-05-09 DIAGNOSIS — G931 Anoxic brain damage, not elsewhere classified: Secondary | ICD-10-CM

## 2011-05-09 DIAGNOSIS — R5381 Other malaise: Secondary | ICD-10-CM

## 2011-05-09 DIAGNOSIS — N179 Acute kidney failure, unspecified: Secondary | ICD-10-CM

## 2011-05-09 DIAGNOSIS — Z5189 Encounter for other specified aftercare: Secondary | ICD-10-CM

## 2011-05-09 MED ORDER — TRAMADOL HCL 50 MG PO TABS
50.0000 mg | ORAL_TABLET | Freq: Two times a day (BID) | ORAL | Status: DC
  Administered 2011-05-09 – 2011-05-12 (×7): 50 mg via ORAL
  Filled 2011-05-09 (×7): qty 1

## 2011-05-09 NOTE — Evaluation (Addendum)
Occupational Therapy Assessment and Plan  Patient Details  Name: Heather Sims MRN: [id number] Date of Birth: 24-May-1990  OT Diagnosis: ataxia Rehab Potential: Rehab Potential: Good ELOS: 14-21 days   Today's Date: 05/09/2011 Time:  1st session:  0800-0905   2nd session:   1300-1345 Time Calculation (min):  1 session= 65 min; 2 session= 45 min  Problem List:  Patient Active Problem List  Diagnoses  . AKI (acute kidney injury)  . Acute heart failure  . Hypoxemia  . Pneumonia  . Anemia  . Hypertension  . ? Viral Cardiomyopathy  . Anoxic brain injury  . Renal failure (ARF), acute on chronic  . Seizures    Past Medical History:  Past Medical History  Diagnosis Date  . Pneumonia last 2 weeks    'walking pneumonia'  . Hypertension   . Seizures   . Chronic kidney disease   . Anemia    Past Surgical History:  Past Surgical History  Procedure Date  . Renal biopsy     Assessment & Plan Clinical Impression: Patient is a 21 y.o. year old female with recent admission to the hospital on Jan. 3, 2013 with  Upper respiratory issues and CPR administered with pt. Pulseless for 5 min.  .  Patient transferred to CIR on 05/08/2011 .    Patient currently requires mod with basic self-care skills secondary to decreased standing balance, decreased postural control and decreased balance strategies.  Prior to hospitalization, patient could complete BADL with supervision.  Patient will benefit from skilled intervention to increase independence with basic self-care skills prior to discharge home with care partner.  Anticipate patient will require 24 hour supervision and follow up home health.  OT - End of Session Endurance Deficit: Yes OT Assessment Rehab Potential: Good Barriers to Discharge: None OT Plan OT Frequency: 1-2 X/day, 60-90 minutes Estimated Length of Stay: 14-21 days OT Treatment/Interventions: Balance/vestibular training;Cognitive remediation/compensation;DME/adaptive  equipment instruction;Neuromuscular re-education;Pain management;Patient/family education;Self Care/advanced ADL retraining;Therapeutic Activities;Therapeutic Exercise;UE/LE Strength taining/ROM;UE/LE Coordination activities;Wheelchair propulsion/positioning  OT Evaluation Precautions/Restrictions  Precautions Precautions: Fall       Pain Pain Assessment Pain Score:  4:  SEE MAR Pain Type: Neuropathic pain Pain Intervention(s): Repositioned Home Living/Prior Functioning Home Living Lives With: Family Receives Help From: Family Type of Home: House Home Layout: One level Home Access: Stairs to enter Entrance Stairs-Rails: None Entrance Stairs-Number of Steps: 2 Bathroom Shower/Tub: Tub/shower unit;Walk-in shower;Curtain Bathroom Toilet: Standard Bathroom Accessibility: No How Accessible: Accessible via walker (can not get wc in bathroom) Home Adaptive Equipment: Walker - rolling Prior Function Level of Independence: Independent with basic ADLs;Independent with gait;Independent with transfers Able to Take Stairs?: Yes Driving: Yes 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) Leisure: Hobbies-yes (Comment) (rides horses, friends) ADL ADL Eating: Set up Where Assessed-Eating: Chair Grooming: Minimal assistance Upper Body Bathing: Minimal assistance Where Assessed-Upper Body Bathing: shower Lower Body Bathing: Moderate assistance Where Assessed-Lower Body Bathing:shower Upper Body Dressing: Minimal assistance Where Assessed-Upper Body Dressing: Wheelchair Lower Body Dressing: Moderate assistance Where Assessed-Lower Body Dressing: Wheelchair;Standing at sink Toileting: Maximal assistance Where Assessed-Toileting: Bedside Commode Toilet Transfer: Moderate assistance Toilet Transfer Method: Stand pivot Acupuncturist: Web designer:  Stand pivot with max assist Vision/Perception  Vision - History Baseline Vision: No visual  deficits Perception Perception: Within Functional Limits  Cognition Overall Cognitive Status: Impaired Arousal/Alertness: Lethargic Orientation Level: Oriented X4;Disoriented to time Attention: Sustained Focused Attention: Appears intact Sustained Attention: Impaired Sustained Attention Impairment: Verbal basic;Functional basic Memory: Impaired Memory Impairment: Decreased  short term memory Decreased Short Term Memory: Functional basic;Verbal basic Awareness: Impaired Awareness Impairment: Emergent impairment;Other (comment) Problem Solving: Impaired Problem Solving Impairment: Verbal basic;Functional basic Executive Function: Sequencing;Organizing;Initiating;Self Monitoring;Self Correcting Reasoning: Impaired Reasoning Impairment: Verbal basic;Functional basic Sequencing: Impaired Sequencing Impairment: Functional basic Organizing: Impaired Organizing Impairment: Verbal basic;Functional basic Initiating: Impaired Initiating Impairment: Verbal basic;Functional basic Self Monitoring: Impaired Self Monitoring Impairment: Verbal basic;Functional basic Self Correcting: Impaired Self Correcting Impairment: Verbal basic;Functional basic Behaviors: Lability Safety/Judgment: Impaired Sensation Sensation Light Touch: Appears Intact Additional Comments: bil foot pain descriptions in different "colors," increased by light touch Coordination Gross Motor Movements are Fluid and Coordinated: No Fine Motor Movements are Fluid and Coordinated: No Coordination and Movement Description:  (decreased fine motor in BUE, ataxia) Motor    Mobility  Bed Mobility Bed Mobility: Yes Rolling Left: 4: Min assist Rolling Left Details: Verbal cues for precautions/safety Transfers Transfers: Yes Sit to Stand: 2: Max assist;From chair/3-in-1 Stand to Sit: 3: Mod assist;With upper extremity assist;To chair/3-in-1;To bed Stand to Sit Details (indicate cue type and reason): Tactile cues for weight  shifting;Visual cues for safe use of DME/AE;Visual cues/gestures for precautions/safety;Verbal cues for sequencing;Verbal cues for technique;Verbal cues for precautions/safety;Manual facilitation for weight shifting;Manual facilitation for placement  Trunk/Postural Assessment  Cervical Assessment Cervical Assessment: Within Functional Limits Postural Control Postural Control: Deficits on evaluation Trunk Control:  (min assist for sitiing unsupported)  Balance Static Sitting Balance Static Sitting - Balance Support: Feet supported;Bilateral upper extremity supported Extremity/Trunk Assessment RUE Assessment RUE Assessment: Within Functional Limits RUE Strength RUE Overall Strength: Deficits LUE Assessment LUE Assessment: Exceptions to [facility] LUE Strength LUE Overall Strength: Deficits LUE Overall Strength Comments: sho=4/5; hand= 3+/5  See FIM for current functional status Refer to Care Plan for Long Term Goals STG:  1.  Pt. Will bath UB/LB at supervision level.  2. Pt will dress UB/LB self at supervision level.  3. Pt. Will transfer to toilet with minimal assist.  4.  Pt.  Will transfer to shower with minimal assist 50 % of time.  5.  Pt. Will maintain dynamic standing balance with moderate assist during bathing using grab bars.   Recommendations for other services: None  Discharge Criteria: Patient will be discharged from OT if patient refuses treatment 3 consecutive times without medical reason, if treatment goals not met, if there is a change in medical status, if patient makes no progress towards goals or if patient is discharged from hospital.  The above assessment, treatment plan, treatment alternatives and goals were discussed and mutually agreed upon: by family  Humberto Seals 05/09/2011, 1:44 PM

## 2011-05-09 NOTE — Evaluation (Signed)
Speech Language Pathology Assessment and Plan  Patient Details  Name: Heather Sims MRN: [id number] Date of Birth: [birth year]  SLP Diagnosis: severe cognitive impairments, dysarthria, language impairment  Rehab Potential: Fair ELOS: TBD   Today's Date: 05/09/2011 Time: 1435-1520 Time Calculation (min): 45 min  Therapeutic Intervention: Administered cognitive-linguistic evaluation. Please see below for details.   Problem List:  Patient Active Problem List  Diagnoses  . AKI (acute kidney injury)  . Acute heart failure  . Hypoxemia  . Pneumonia  . Anemia  . Hypertension  . ? Viral Cardiomyopathy  . Anoxic brain injury  . Renal failure (ARF), acute on chronic  . Seizures    Past Medical History:  Past Medical History  Diagnosis Date  . Pneumonia last 2 weeks    'walking pneumonia'  . Hypertension   . Seizures   . Chronic kidney disease   . Anemia    Past Surgical History:  Past Surgical History  Procedure Date  . Renal biopsy     Assessment & Plan Clinical Impression: 21 year old right-handed female with recent diagnosis of hypertension as well as upper respiratory infection which she had been placed on Avelox. Admitted January 3 with sudden onset shortness of breath and cardiorespiratory failure with CPR in the field. She was pulseless for approximately 5 minutes and intubated in the emergency department. Underwent hypothermia protocol and suffered seizures upon rewarming. Cranial CT scan and MRI of the brain showed no acute changes. Echocardiogram with ejection fraction 40% and systolic function moderately reduced. TEE with diffuse hypokinesis ejection fraction 45%. Venous Doppler studies lower extremities were negative. EEG was minimal seizure activity. MRI of cervical thoracic lumbar spine unremarkable. Noted hemoglobin 8.1 and creatinine 2.31 upon admission she was transfused 3 units of packed red blood cells. Renal ultrasound was negative for hydronephrosis.  Nephrology consulted felt renal insufficiency due to hypertension. Renal biopsy completed January 18 to complete workup of acute renal insufficiency with results pending. Post operative biopsy with perirenal hematoma and close monitoring with CBC that remained stable. Latest creatinine 1.95 felt to be stabilized. High dose Lasix had been discontinued with stabilizing renal function January 20. Patient had been on subcutaneous heparin for deep vein thrombosis prophylaxis was discontinued January 17 with renal biopsy completed January 18. Placed on valproate 1000 mg every 12 hours per neurology services for seizure disorder. No further seizure activity has been reported. Patient continued to have bouts of myoclonus of the lower extremities improved after being placed on clonazepam. 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 Patient was ultimately admitted to inpatient rehabilitation services January 21 for comprehensive rehabilitation program. Patient has ongoing followup in regards to acute renal failure as well as her malignant hypertension. 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 Her creatinine gradually continued to worsen. Her Lasix had been discontinued on February 2. Followup renal services advised patient to be transferred to [facility] Dr.Nachman who is an expert in the field on glomerular disease. Patient was transferred to [facility] February 5 for further management regards to her renal function. At [facility] she was given a fluid challenge of 2 L over 24 hours after which patient made adequate urine output per old records. She did receive a renal sonogram that showed no structural uropathy and a small right peri-nephric hematoma compatible with post renal biopsy. A 2-D echocardiogram completed with ejection fraction of 50% and mild diastolic dysfunction. Patient's Neurontin was discontinued for neuropathy  secondary to sedation and monitored. Her latest creatinine at [facility] was 2.9 and followup once arrived to [facility] creatinine 3.2. No further workup was indicated this patient was transferred back to [facility] on February 13 for ongoing medical care and consideration of inpatient rehabilitation services to be resumed. She had been requiring moderate to max assist for activities of daily living and minimal assistance to ambulate 20 feet with a rolling walker for overall mobility and max assist for her safety and awareness prior to her discharge to [facility]. Physical medicine and rehabilitation was again consulted for followup and plan was to readmit back inpatient rehabilitation services for ongoing therapy and maximize her overall mobility. Pt transferred back to CIR 05/08/11 and presents with severe cognitive impairments characterized by impaired initiation, sustained attention, intellectual awareness, working memory, functional problem solving, organization and sequencing. Pt also demonstrating language of confusion, moderate dysarthria characterized by decreased voice intensity and decreased articulation, decreased auditory processing and dysnomia. Pt would benefit from skilled SLP services to maximize cognitive function, functional communication and overall independence for discharge home.    SLP - End of Session Patient left: in chair;with call bell in reach;with family/visitor present Nurse Communication: Cognitive/Linguistic strategies reviewed Assessment SLP Recommendation/Assessment: Patient will need skilled Speech Lanaguage Pathology Services during CIR admission Rehab Potential: Fair Barriers to Discharge: None Therapy Diagnosis: Cognitive Impairments;Dysarthria;Speech and Language deficits SLP Plan SLP Frequency: 1-2 X/day, 30-60 minutes Estimated Length of Stay: TBD SLP Treatment/Interventions: Cognitive remediation/compensation;Cueing  hierarchy;Environmental controls;Functional tasks;Internal/external aids;Therapeutic Activities;Patient/family education;Speech/Language facilitation Recommendation Follow up Recommendations: Home Health SLP Equipment Recommended: None recommended by SLP  SLP Evaluation Precautions/Restrictions  Precautions Precautions: Fall Pain Pain Assessment Pain Assessment: No/denies pain Prior Functioning Type of Home: House Lives With: Family Receives Help From: Family 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) Cognition Overall Cognitive Status: Impaired Arousal/Alertness: Lethargic Orientation Level: Oriented X4 Attention: Sustained Focused Attention: Appears intact Sustained Attention: Impaired Sustained Attention Impairment: Verbal basic;Functional basic Memory: Impaired Memory Impairment: Decreased recall of new information;Decreased short term memory Decreased Short Term Memory: Verbal basic;Functional basic Awareness: Impaired Awareness Impairment: Emergent impairment;Anticipatory impairment Problem Solving: Impaired Problem Solving Impairment: Verbal basic;Functional basic Executive Function: Sequencing;Organizing;Self Monitoring;Self Correcting;Initiating Reasoning: Impaired Reasoning Impairment: Verbal basic;Functional basic Sequencing: Impaired Sequencing Impairment: Verbal basic;Functional basic Organizing: Impaired Organizing Impairment: Verbal basic;Functional basic Decision Making: Appears intact Initiating: Impaired Initiating Impairment: Verbal basic;Functional basic Self Monitoring: Impaired Self Monitoring Impairment: Verbal basic;Functional basic Self Correcting: Impaired Self Correcting Impairment: Verbal basic;Functional basic Behaviors: Lability Safety/Judgment: Impaired Comprehension Auditory Comprehension Overall Auditory Comprehension: Impaired Yes/No Questions: Within Functional Limits Commands: Impaired Two Step Basic Commands: 50-74%  accurate Multistep Basic Commands: 25-49% accurate Conversation: Simple Interfering Components: Attention;Processing speed;Working Radio broadcast assistant: Armed forces training and education officer Discrimination: Within [employer] Reading Comprehension Reading Status: Not tested Expression Expression Primary Mode of Expression: Verbal Verbal Expression Overall Verbal Expression: Impaired Initiation: Impaired Automatic Speech: Social Response;Name Level of Generative/Spontaneous Verbalization: Sentence Repetition: No impairment Naming: Impairment Responsive: 76-100% accurate Confrontation: Impaired (50%) Verbal Errors: Perseveration;Language of confusion;Not aware of errors Pragmatics: Impairment Impairments: Turn Taking;Abnormal affect Interfering Components: Attention;Speech intelligibility Effective Techniques: Semantic cues;Sentence completion Written Expression Dominant Hand: Right Written Expression: Not tested Oral/Motor Oral Motor/Sensory Function Overall Oral Motor/Sensory Function: Appears within functional limits for tasks assessed Motor Speech Overall Motor Speech: Impaired Respiration: Within functional limits Phonation: Low vocal intensity Resonance: Within functional limits Articulation: Within functional limitis Intelligibility: Intelligibility reduced Word: 75-100% accurate Phrase: 75-100% accurate Sentence: 75-100%  accurate Conversation: 75-100% accurate Motor Planning: Witnin functional limits Motor Speech Errors: Not applicable Effective Techniques: Over-articulate (slow rate, increased voice volume)  See FIM for current functional status Refer to Care Plan for Long Term Goals  Recommendations for other services: None  Discharge Criteria: Patient will be discharged from SLP if patient refuses treatment 3 consecutive times without medical reason, if treatment goals not met, if there is a  change in medical status, if patient makes no progress towards goals or if patient is discharged from hospital.  The above assessment, treatment plan, treatment alternatives and goals were discussed and mutually agreed upon: by patient  Individual Therapy   Josef Tourigny 05/09/2011, 3:24 PM  .

## 2011-05-09 NOTE — Progress Notes (Addendum)
Overall Plan of Care [facility]) Patient Details Name: Elliet Goodnow MRN: [id number] DOB: 16-Nov-1990  Diagnosis: anoxic brain injury and deconditioning    Primary Diagnosis:    <principal problem not specified> Co-morbidities: renal failure, pain  Functional Problem List  Patient demonstrates impairments in the following areas: Balance, Cognition, Edema, Endurance, Motor, Pain, Safety, Sensory  and Vision  Basic ADL's: eating, grooming, bathing, dressing and toileting Advanced ADL's: simple meal preparation  Transfers:  bed mobility, bed to chair, toilet, tub/shower, car, furniture and floor Locomotion:  ambulation, wheelchair mobility and stairs  Additional Impairments:  Functional use of upper extremity, Communication  comprehension and expression, Social Cognition   social interaction, problem solving, memory, attention and awareness and Leisure Awareness, communication, memory, problem solving, awareness, attention,   Anticipated Outcomes Item Anticipated Outcome  Eating/Swallowing  Mod I  Basic self-care  supervision  Tolieting  supervision  Bowel/Bladder    Transfers  supervision  Locomotion    Communication  Supervision  Cognition  Min A  Pain    Safety/Judgment  Min A  Other     Therapy Plan: PT Frequency: 1-2 X/day, 60-90 minutes OT Frequency: 1-2 X/day, 60-90 minutes SLP Frequency: 1-2 X/day, 30-60 minutes   Team Interventions: Item RN PT OT SLP SW TR Other  Self Care/Advanced ADL Retraining  x x      Neuromuscular Re-Education  x x      Therapeutic Activities  x x x     UE/LE Strength Training/ROM  x x      UE/LE Coordination Activities  x x      Visual/Perceptual Remediation/Compensation   x      DME/Adaptive Equipment Instruction  x x      Therapeutic Exercise  x x      Balance/Vestibular Training  x x      Patient/Family Education  x x x     Cognitive Remediation/Compensation  x x x     Functional Mobility Training  x x      Ambulation/Gait  Training  x       Stair Training  x       Wheelchair Propulsion/Positioning  x       Functional Statistician  x       Community Reintegration  x       Dysphagia/Aspiration Landscape architect Facilitation   x x     Bladder Management         Bowel Management         Disease Management/Prevention         Pain Management  x x      Medication Management         Skin Care/Wound Management  x       Splinting/Orthotics  x       Discharge Planning  x x x     Psychosocial Support  x x                         Team Discharge Planning: Destination:  Home Projected Follow-up: OT, SLP PT and Outpatient Projected Equipment Needs:  Cushion and Wheelchair Patient/family involved in discharge planning:  Yes  MD ELOS: 7-14 DAYS Medical Rehab Prognosis:  Good Assessment: pt readmitted after transfer to [facility].  Now with generalized deconditioning in the setting of her prior problems related to her anoxic bi.  Pt was at goals at time of transfer from [facility].  i find it hard to imagine that she will need a protracted stay given that no new complications have arisen.

## 2011-05-09 NOTE — Evaluation (Addendum)
Physical Therapy Assessment and Plan  Patient Details  Name: Heather Sims MRN: [id number] Date of Birth: 30-Jun-1990  Sims Diagnosis: Abnormal posture, Abnormality of gait, Ataxia, Ataxic gait, Cognitive deficits, Coordination disorder, Hypertonia, Impaired cognition and Impaired sensation Rehab Potential: Good ELOS: 3 weeks   Today's Date: 05/09/2011 Time: 0905-1000 Time Calculation (min): 55 min  Problem List:  Patient Active Problem List  Diagnoses  . AKI (acute kidney injury)  . Acute heart failure  . Hypoxemia  . Pneumonia  . Anemia  . Hypertension  . ? Viral Cardiomyopathy  . Anoxic brain injury  . Renal failure (ARF), acute on chronic  . Seizures    Past Medical History:  Past Medical History  Diagnosis Date  . Pneumonia last 2 weeks    'walking pneumonia'  . Hypertension   . Seizures   . Chronic kidney disease   . Anemia    Past Surgical History:  Past Surgical History  Procedure Date  . Renal biopsy     Assessment & Plan Clinical Impression:  21 year-old female who has had Heather cardiac arrest on the field on March 25, 2010 and had CPR for around 5 minutes was brought to Heather Sims cone was intubated and was placed on hypothermic protocol and subsequently had seizures also was found to be having renal failure. Eventually patient was discharged to inpatient rehabilitation. Since patient's creatinine was getting worse it went up to 4 and nephrologist transferred to Heather Sims for further management. At Heather Sims as per the discharge summary provided patient did not have much urine output despite given Lasix.  Patient's Neurontin which was started for neuropathy was discontinued use sedation and with the was started. Her creatinine had improved up to 2.9 discharge.Patient is transferred back to Heather Sims as nephrologist at Heather [facility]s felt there is no further acute needs for her renal issues.   At time of eval, Sims very lethargic with difficulty  keeping eyes open as she was recently given Vicoden. Patient currently requires +1 Total assist with mobility secondary to muscle weakness, muscle joint tightness and impaired motor control,severely decreased activity tolerance and impaired muscle grading. 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 She lived in an apartment PTA, but will D/C to her mother's one level Heather with 1 step to enter.  Patient will benefit from skilled Sims intervention to maximize safe functional mobility, minimize fall risk and decrease caregiver burden for planned discharge Heather with 24 hour supervision. Anticipate patient will benefit from follow up HH at discharge.   Sims - End of Session Activity Tolerance: Tolerates 10 - 20 min activity with multiple rests Endurance Deficit: Yes Endurance Deficit Description: Sims very lethargic this tx, fell asleep before therapist left the room at end of tx.  Sims Assessment Rehab Potential: Good Barriers to Discharge: None Sims Plan Sims Frequency: 1-2 X/day, 60-90 minutes Estimated Length of Stay: 3 weeks Sims Treatment/Interventions: Ambulation/gait training;Balance/vestibular training;Cognitive remediation/compensation;DME/adaptive equipment instruction;Community reintegration;Functional mobility training;Neuromuscular re-education;Pain management;Patient/family education;Therapeutic Exercise;Therapeutic Activities;Stair training;Splinting/orthotics;UE/LE Strength taining/ROM;UE/LE Coordination activities;Wheelchair propulsion/positioning Sims Recommendation Follow Up Recommendations: Heather Sims;24 hour supervision/assistance Equipment Recommended: Wheelchair (measurements);Wheelchair cushion (measurements)  Sims Evaluation Precautions/Restrictions Precautions Precautions: Fall Restrictions Weight Bearing Restrictions: No    Pain Pain Assessment Pain Score:   8 Pain Type: Neuropathic pain Pain Intervention(s):  Repositioned Heather Living/Prior Functioning Heather Living Lives With: Family (mom, dad, brother) Receives Help From: Family Type of Heather: House Heather Layout: One  level Heather Access: Stairs to enter Entrance Stairs-Rails: None Entrance Stairs-Number of Steps: 2 Bathroom Shower/Tub: Tub/shower unit;Walk-in shower Bathroom Toilet: Standard Bathroom Accessibility:  (Mom uncertain, it's small, but she thinks so. ) How Accessible: Accessible via walker (Likely sideways) Heather Adaptive Equipment: Walker - rolling Prior Function Level of Independence: Independent with basic ADLs;Independent with gait;Independent with transfers (Prior to January) Able to Take Stairs?: Yes Driving: Yes Vocation: Consulting civil engineer (Psychology) Vision/Perception  Vision - History Baseline Vision: No visual deficits Perception Perception: Within Functional Limits Praxis Praxis: Intact  Cognition Overall Cognitive Status: Impaired Arousal/Alertness: Lethargic (Given Vicoden recently for pain, difficulty keeping eyes ope) Orientation Level: Oriented X4 Attention: Sustained Focused Attention: Appears intact Sensation Sensation Light Touch: Appears Intact Additional Comments: bil foot pain descriptions in different "colors," increased by light touch Coordination Gross Motor Movements are Fluid and Coordinated: No Fine Motor Movements are Fluid and Coordinated: No Coordination and Movement Description: myoclonic movements B UEs and LEs Heel Shin Test: Poor coordination due to increasing myoclonus with movement Motor  Motor Motor: Ataxia;Abnormal postural alignment and control Motor - Skilled Clinical Observations: Poor trunk control unsupported, tending to lean posteriorly or slump forward in WC, may be due to meds this morning  Mobility Bed Mobility Bed Mobility: Yes Sit to Supine: 1: +1 Total assist Sit to Supine - Details: Visual cues/gestures for precautions/safety;Visual cues/gestures for sequencing;Verbal cues  for sequencing;Verbal cues for technique;Verbal cues for precautions/safety;Manual facilitation for weight shifting;Manual facilitation for placement Sit to Supine - Details (indicate cue type and reason): While sitting EOB, Sims was asked to scoot back, and instead leanded back with hips sliding forward, so therapist assisted Sims to supine with assist for LEs and trunk for proper placement Transfers Transfers: Yes Sit to Stand: 1: +1 Total assist;With upper extremity assist;From bed;From chair/3-in-1;With armrests Sit to Stand Details: Visual cues/gestures for precautions/safety;Verbal cues for sequencing;Verbal cues for technique;Verbal cues for precautions/safety;Verbal cues for safe use of DME/AE;Manual facilitation for weight shifting;Manual facilitation for placement Sit to Stand Details (indicate cue type and reason): Cues to scoot forward in chair, hand placement, and initiation. Assist for lifting hips and blocking knees. Sims with poor sustained UE strength sufficient to push up from chair. LEs continued to buckle throughout stand, seeming to worsen with effort to control Stand to Sit: 3: Mod assist;With upper extremity assist;To chair/3-in-1;To bed Stand to Sit Details (indicate cue type and reason): Tactile cues for weight shifting;Visual cues for safe use of DME/AE;Visual cues/gestures for precautions/safety;Verbal cues for sequencing;Verbal cues for technique;Verbal cues for precautions/safety;Manual facilitation for weight shifting;Manual facilitation for placement Stand to Sit Details: Cues for hand placement Squat Pivot Transfers: 1: +1 Total assist;With upper extremity assistance Squat Pivot Transfer Details (indicate cue type and reason): Verbal and visual cues for sequence and technique. Manual assist for lifting, turnign, and lowering Sims.  Locomotion  Ambulation Ambulation: Yes Ambulation/Gait Assistance: 1: +1 Total assist Ambulation Distance (Feet): 3 Feet Assistive device:  Parallel bars Ambulation/Gait Assistance Details (indicate cue type and reason): Manual assist to block knees and extend hips in // bars as well as physical assist to weight shift for LE advancement.  Stairs / Additional Locomotion Stairs: No Corporate treasurer: Yes Wheelchair Assistance: 2: Max Chiropodist Details: Verbal cues for sequencing;Verbal cues for technique;Verbal cues for precautions/safety;Verbal cues for safe use of DME/AE;Visual cues/gestures for precautions/safety;Visual cues for safe use of DME/AE Wheelchair Propulsion: Both upper extremities Wheelchair Parts Management: Needs assistance Distance: 30  Trunk/Postural Assessment  Postural Control Postural  Control: Deficits on evaluation Trunk Control: Mod Heather for sitting unsupported, tending to lean posteriorly  Balance Balance Balance Assessed: Yes Static Sitting Balance Static Sitting - Balance Support: Feet supported;Bilateral upper extremity supported Static Sitting - Level of Assistance: 3: Mod assist Static Standing Balance Static Standing - Balance Support: Bilateral upper extremity supported Static Standing - Level of Assistance: 2: Max assist Static Standing - Comment/# of Minutes: Stood 3 min in // bars, able to reduce myoclonic activity with relaxation and distraction  Extremity Assessment  RUE Assessment RUE Assessment: Not tested LUE Assessment LUE Assessment: Not tested RLE Assessment RLE Assessment: Exceptions to [facility] RLE Strength RLE Overall Strength Comments: Difficulty to assess due to ataxia, but <3/5 throught with attempts at MMT LLE Assessment LLE Assessment: Exceptions to [facility] LLE Strength LLE Overall Strength Comments: Difficulty to assess due to ataxia, but <3/5 throught   See FIM for current functional status Refer to Care Plan for Long Term Goals  Recommendations for other services: None  Discharge Criteria: Patient will be discharged from Sims if  patient refuses treatment 3 consecutive times without medical reason, if treatment goals not met, if there is Heather change in medical status, if patient makes no progress towards goals or if patient is discharged from Sims.  The above assessment, treatment plan, treatment alternatives and goals were discussed and mutually agreed upon: by patient and by family Tx imitated this session with TA for posture and functional balance in sitting and standing. Sims given cues for posture and hip/trunk extension in standing at // bars. Ataxia seemed to increase initially upon standing, but reduced somewhat in 10 sec with cues to breathe and relax. ( , TA)  [address] [place], [place] [phone]  05/09/2011, 10:51 AM

## 2011-05-09 NOTE — Progress Notes (Signed)
Patient ID: Heather Sims, female   DOB: 1991/01/29, 21 y.o.   MRN: [id number] Subjective/Complaints: Review of Systems  Musculoskeletal: Positive for joint pain.  Neurological: Positive for tingling and tremors.  slept fairly well. Still with foot pain which remain quite variable and vague 2/16   Objective: Vital Signs: Blood pressure 128/80, pulse 84, temperature 99.5 F (37.5 C), temperature source Oral, resp. rate 18, height 5\' 2"  (1.575 m), weight 56.155 kg (123 lb 12.8 oz), last menstrual period 03/24/2011, SpO2 94.00%. No results found.  Basename 05/07/11 0545 05/06/11 2208  WBC 12.8* 13.0*  HGB 7.9* 8.3*  HCT 24.7* 26.5*  PLT 118* 123*    Basename 05/07/11 0545 05/06/11 2208  NA 140 141  K 3.9 4.1  CL 107 107  CO2 22 22  GLUCOSE 88 88  BUN 50* 50*  CREATININE 3.20* 3.27*  CALCIUM 8.3* 8.5   CBG (last 3)  No results found for this basename: GLUCAP:3 in the last 72 hours  Wt Readings from Last 3 Encounters:  05/09/11 56.155 kg (123 lb 12.8 oz)  05/07/11 58 kg (127 lb 13.9 oz)  04/28/11 56.79 kg (125 lb 3.2 oz)    Physical Exam:  General appearance: fatigued, mild distress and slowed mentation Head: Normocephalic, without obvious abnormality, atraumatic Eyes: conjunctivae/corneas clear. PERRL, EOM's intact. Fundi benign. Ears: normal TM's and external ear canals both ears Nose: Nares normal. Septum midline. Mucosa normal. No drainage or sinus tenderness. Throat: lips, mucosa, and tongue normal; teeth and gums normal Neck: no adenopathy, no carotid bruit, no JVD, supple, symmetrical, trachea midline and thyroid not enlarged, symmetric, no tenderness/mass/nodules Back: symmetric, no curvature. ROM normal. No CVA tenderness. Resp: clear to auscultation bilaterally Cardio: regular rate and rhythm, S1, S2 normal, no murmur, click, rub or gallop GI: soft, non-tender; bowel sounds normal; no masses,  no organomegaly Extremities: extremities normal, atraumatic, no  cyanosis or edema Pulses: 2+ and symmetric Skin: Skin color, texture, turgor normal. No rashes or lesions Neurologic: continued slow mentation, poor insight and awareness, myoclonus, tremor.  Moves all 4's.  Behavioral component to movement issues also. Poor cooperation with exam today. Incision/Wound:  2/16- exam  Assessment/Plan: 1. Functional deficits secondary to anoxic BI/ deconditioning which require 3+ hours per day of interdisciplinary therapy in a comprehensive inpatient rehab setting. Physiatrist is providing close team supervision and 24 hour management of active medical problems listed below. Physiatrist and rehab team continue to assess barriers to discharge/monitor patient progress toward functional and medical goals. FIM:                   Comprehension Comprehension Mode: Auditory Comprehension: 3-Understands basic 50 - 74% of the time/requires cueing 25 - 50%  of the time  Expression Expression Mode: Verbal Expression: 3-Expresses basic 50 - 74% of the time/requires cueing 25 - 50% of the time. Needs to repeat parts of sentences.  Social Interaction Social Interaction: 3-Interacts appropriately 50 - 74% of the time - May be physically or verbally inappropriate.  Problem Solving Problem Solving: 3-Solves basic 50 - 74% of the time/requires cueing 25 - 49% of the time  Memory Memory: 1-Recognizes or recalls less than 25% of the time/requires cueing greater than 75% of the time   . 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 DVT Prophylaxis/Anticoagulation: Subcutaneous heparin. Monitor platelet counts and any signs of bleeding  3. seizure disorder. Depakote 1000 mg twice a day. Monitor for any seizure activity  4. Renal failure/MPGN. Mgt per [facility]. Weigh patient daily. Followup chemistries serially  5. Hypertension. Norvasc 10 mg daily, clonidine patch 0.3 mg weekly, Isordil 10 mg 3 times daily, Lopressor twice a day,  minoxidil 10 mg daily. Monitor closely with increased activity. Patient's blood pressures have been labile.  6. Anemia. Continue iron supplement. Followup CBC  7. Persistent vague foot pain.  Appears to neuropathic.  Also has myoclonus.  Will try scheduled tramadol.  Don't think she'll tolerate any more lyrica nor will she be able to purchase it once she goes home.   LOS (Days) 1 A FACE TO FACE EVALUATION WAS PERFORMED  Sarea Fyfe T 05/09/2011, 7:32 AM

## 2011-05-10 NOTE — Progress Notes (Signed)
Patient ID: Heather Sims, female   DOB: 03-26-1990, 21 y.o.   MRN: [id number] Patient ID: Heather Sims, female   DOB: [birth year], 22 y.o.   MRN: [id number] Subjective/Complaints: Review of Systems  Musculoskeletal: Positive for joint pain.  Neurological: Positive for tingling and tremors.  slept fairly well. 2/17   Objective: Vital Signs: Blood pressure 147/86, pulse 78, temperature 98.5 F (36.9 C), temperature source Oral, resp. rate 18, height 5\' 2"  (1.575 m), weight 56.3 kg (124 lb 1.9 oz), last menstrual period 03/24/2011, SpO2 97.00%. No results found. No results found for this basename: WBC:2,HGB:2,HCT:2,PLT:2 in the last 72 hours No results found for this basename: NA:2,K:2,CL:2,CO2:2,GLUCOSE:2,BUN:2,CREATININE:2,CALCIUM:2 in the last 72 hours CBG (last 3)  No results found for this basename: GLUCAP:3 in the last 72 hours  Wt Readings from Last 3 Encounters:  05/10/11 56.3 kg (124 lb 1.9 oz)  05/07/11 58 kg (127 lb 13.9 oz)  04/28/11 56.79 kg (125 lb 3.2 oz)    Physical Exam:  General appearance: fatigued, mild distress and slowed mentation Head: Normocephalic, without obvious abnormality, atraumatic Eyes: conjunctivae/corneas clear. PERRL, EOM's intact. Fundi benign. Ears: normal TM's and external ear canals both ears Nose: Nares normal. Septum midline. Mucosa normal. No drainage or sinus tenderness. Throat: lips, mucosa, and tongue normal; teeth and gums normal Neck: no adenopathy, no carotid bruit, no JVD, supple, symmetrical, trachea midline and thyroid not enlarged, symmetric, no tenderness/mass/nodules Back: symmetric, no curvature. ROM normal. No CVA tenderness. Resp: clear to auscultation bilaterally Cardio: regular rate and rhythm, S1, S2 normal, no murmur, click, rub or gallop GI: soft, non-tender; bowel sounds normal; no masses,  no organomegaly Extremities: extremities normal, atraumatic, no cyanosis or edema Pulses: 2+ and symmetric Skin: Skin color,  texture, turgor normal. No rashes or lesions Neurologic: continued slow mentation, poor insight and awareness, myoclonus, tremor.  Moves all 4's.  Behavioral component to movement issues also. Poor cooperation with exam today. Incision/Wound:  2/17- exam  Assessment/Plan: 1. Functional deficits secondary to anoxic BI/ deconditioning which require 3+ hours per day of interdisciplinary therapy in a comprehensive inpatient rehab setting. Physiatrist is providing close team supervision and 24 hour management of active medical problems listed below. Physiatrist and rehab team continue to assess barriers to discharge/monitor patient progress toward functional and medical goals. FIM: FIM - Bathing Bathing Steps Patient Completed: Chest;Right Arm;Right upper leg;Left upper leg;Right lower leg (including foot);Left lower leg (including foot) Bathing: 4: Min-Patient completes 8-9 89f 10 parts or 75+ percent  FIM - Upper Body Dressing/Undressing Upper body dressing/undressing steps patient completed: Thread/unthread right bra strap;Thread/unthread left bra strap;Thread/unthread right sleeve of pullover shirt/dresss;Thread/unthread left sleeve of pullover shirt/dress;Put head through opening of pull over shirt/dress;Pull shirt over trunk Upper body dressing/undressing: 4: Min-Patient completed 75 plus % of tasks FIM - Lower Body Dressing/Undressing Lower body dressing/undressing steps patient completed: Thread/unthread right underwear leg;Thread/unthread left underwear leg;Pull underwear up/down;Thread/unthread right pants leg;Thread/unthread left pants leg;Pull pants up/down Lower body dressing/undressing: 3: Mod-Patient completed 50-74% of tasks  FIM - Toileting Toileting steps completed by patient: Performs perineal hygiene Toileting Assistive Devices: Grab bar or rail for support Toileting: 2: Max-Patient completed 1 of 3 steps  FIM - Diplomatic Services operational officer Devices: Photographer Transfers: 2-To toilet/BSC: Max A (lift and lower assist)  FIM - Press photographer Assistive Devices: Bed rails Bed/Chair Transfer: 1: Sit > Supine: Total A (helper does all/Pt. < 25%);1: Bed > Chair or W/C: Total A (helper does all/Pt. < 25%);1:  Chair or W/C > Bed: Total A (helper does all/Pt. < 25%)  FIM - Locomotion: Wheelchair Distance: 30 Locomotion: Wheelchair: 1: Travels less than 50 ft with maximal assistance (Pt: 25 - 49%) FIM - Locomotion: Ambulation Locomotion: Ambulation Assistive Devices: Parallel bars Ambulation/Gait Assistance: 1: +1 Total assist Locomotion: Ambulation: 1: Travels less than 50 ft with maximal assistance (Pt: 25 - 49%)  Comprehension Comprehension Mode: Auditory Comprehension: 2-Understands basic 25 - 49% of the time/requires cueing 51 - 75% of the time  Expression Expression Mode: Verbal Expression: 2-Expresses basic 25 - 49% of the time/requires cueing 50 - 75% of the time. Uses single words/gestures.  Social Interaction Social Interaction: 2-Interacts appropriately 25 - 49% of time - Needs frequent redirection.  Problem Solving Problem Solving: 2-Solves basic 25 - 49% of the time - needs direction more than half the time to initiate, plan or complete simple activities  Memory Memory: 2-Recognizes or recalls 25 - 49% of the time/requires cueing 51 - 75% of the time   . 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 DVT Prophylaxis/Anticoagulation: Subcutaneous heparin. Monitor platelet counts and any signs of bleeding  3. seizure disorder. Depakote 1000 mg twice a day. Monitor for any seizure activity  4. Renal failure/MPGN. Mgt per [employer]. Weigh patient daily. Followup chemistries serially  5. Hypertension. Norvasc 10 mg daily, clonidine patch 0.3 mg weekly, Isordil 10 mg 3 times daily, Lopressor twice a day, minoxidil 10 mg daily. Monitor closely with increased activity.  -bp's a  little more stable during this admit 6. Anemia. Continue iron supplement. Followup CBC  7. Persistent vague foot pain.  Appears to neuropathic.  Also has myoclonus.  Tramadol trial.  Don't think she'll tolerate any more lyrica nor will she be able to purchase it once she goes home.   LOS (Days) 2 A FACE TO FACE EVALUATION WAS PERFORMED  SWARTZ,ZACHARY T 05/10/2011, 7:23 AM

## 2011-05-10 NOTE — Progress Notes (Signed)
Occupational Therapy Session Note  Patient Details  Name: Heather Sims MRN: [id number] Date of Birth: Apr 02, 1990  Today's Date: 05/10/2011 Time: 7846-9629 Time Calculation (min): 45 min  Skilled Therapeutic Interventions/Progress Updates:     Engaged in bed mobility, sitting EOB with supervision level, transferred with total assist.  Pt.'s voice is low and difficult to hear.  Sentences/conversation was disjointed.  Therapy Documentation Precautions:  Precautions Precautions: Fall Restrictions Weight Bearing Restrictions: No   Pain: none  See FIM for current functional status  Therapy/Group: Individual Therapy  Humberto Seals 05/10/2011, 5:27 PM

## 2011-05-11 LAB — CBC
MCV: 95.4 fL (ref 78.0–100.0)
Platelets: 207 10*3/uL (ref 150–400)
RBC: 3.05 MIL/uL — ABNORMAL LOW (ref 3.87–5.11)
WBC: 13.1 10*3/uL — ABNORMAL HIGH (ref 4.0–10.5)

## 2011-05-11 LAB — DIFFERENTIAL
Basophils Absolute: 0.1 10*3/uL (ref 0.0–0.1)
Basophils Relative: 1 % (ref 0–1)
Eosinophils Relative: 3 % (ref 0–5)
Monocytes Absolute: 1.6 10*3/uL — ABNORMAL HIGH (ref 0.1–1.0)

## 2011-05-11 LAB — COMPREHENSIVE METABOLIC PANEL
ALT: <5 U/L (ref 0–35)
AST: 14 U/L (ref 0–37)
Alkaline Phosphatase: 39 U/L (ref 39–117)
CO2: 25 mEq/L (ref 19–32)
Chloride: 107 mEq/L (ref 96–112)
GFR calc non Af Amer: 31 mL/min — ABNORMAL LOW (ref >90)
Sodium: 141 mEq/L (ref 135–145)
Total Bilirubin: 0.1 mg/dL — ABNORMAL LOW (ref 0.3–1.2)

## 2011-05-11 MED ORDER — FUROSEMIDE 80 MG PO TABS
160.0000 mg | ORAL_TABLET | Freq: Two times a day (BID) | ORAL | Status: DC
  Administered 2011-05-11 – 2011-05-12 (×3): 160 mg via ORAL
  Filled 2011-05-11 (×6): qty 2

## 2011-05-11 MED ORDER — LISINOPRIL 10 MG PO TABS
10.0000 mg | ORAL_TABLET | Freq: Every day | ORAL | Status: DC
  Administered 2011-05-11 – 2011-05-12 (×2): 10 mg via ORAL
  Filled 2011-05-11 (×4): qty 1

## 2011-05-11 NOTE — Progress Notes (Signed)
[facility] Individual Statement of Services  Patient Name:  Heather Sims  Date:  05/11/2011  Welcome to the [facility].  Our goal is to provide you with an individualized program based on your diagnosis and situation, designed to meet your specific needs.  With this comprehensive rehabilitation program, you will be expected to participate in at least 3 hours of rehabilitation therapies Monday-Friday, with modified therapy programming on the weekends.  Your rehabilitation program will include the following services:  Physical Therapy (PT), Occupational Therapy (OT), Speech Therapy (ST), 24 hour per day rehabilitation nursing, Therapeutic Recreaction (TR), Case Management (RN and Child psychotherapist), Rehabilitation Medicine, Nutrition Services and Pharmacy Services  Weekly team conferences will be held on  Tuesday  to discuss your progress.  Your RN Case Designer, television/film set will talk with you frequently to get your input and to update you on team discussions.  Team conferences with you and your family in attendance may also be held.  Expected length of stay: 14-21 days  Overall anticipated outcome: Min Assist  Depending on your progress and recovery, your program may change.  Your RN Case Estate agent will coordinate services and will keep you informed of any changes.  Your RN Sports coach and SW names and contact numbers are listed  below.  The following services may also be recommended but are not provided by the [facility]:   Driving Evaluations  Home Health Rehabiltiation Services  Outpatient Rehabilitatation [facility]  Vocational Rehabilitation   Arrangements will be made to provide these services after discharge if needed.  Arrangements include referral to agencies that provide these services.  Your insurance has been verified to be:  0 Your primary doctor is:  [facility]  appointments  Pertinent  information will be shared with your doctor and your insurance company.  Case Manager: Melanee Spry, [facility] [phone]  Social Worker:  Amada Jupiter, [place] [phone]  Information discussed with pt & mother & copy given to them by: Brock Ra, 05/11/2011, 10:52 AM

## 2011-05-11 NOTE — Progress Notes (Signed)
Subjective/Complaints: Review of Systems  Musculoskeletal: Positive for joint pain.  Neurological: Positive for tingling and tremors.  2/18:  Pt awake.  Says feet feel better. Slept well   Objective: Vital Signs: Blood pressure 157/88, pulse 86, temperature 98.6 F (37 C), temperature source Oral, resp. rate 17, height 5\' 2"  (1.575 m), weight 56.337 kg (124 lb 3.2 oz), last menstrual period 03/24/2011, SpO2 100.00%. No results found.  Basename 05/11/11 0640  WBC 13.1*  HGB 9.0*  HCT 29.1*  PLT 207    Basename 05/11/11 0640  NA 141  K 4.7  CL 107  CO2 25  GLUCOSE 83  BUN 44*  CREATININE 2.21*  CALCIUM 9.1   CBG (last 3)  No results found for this basename: GLUCAP:3 in the last 72 hours  Wt Readings from Last 3 Encounters:  05/11/11 56.337 kg (124 lb 3.2 oz)  05/07/11 58 kg (127 lb 13.9 oz)  04/28/11 56.79 kg (125 lb 3.2 oz)    Physical Exam:  General appearance: more alert today. Head: Normocephalic, without obvious abnormality, atraumatic Eyes: conjunctivae/corneas clear. PERRL, EOM's intact. Fundi benign. Ears: normal TM's and external ear canals both ears Nose: Nares normal. Septum midline. Mucosa normal. No drainage or sinus tenderness. Throat: lips, mucosa, and tongue normal; teeth and gums normal Neck: no adenopathy, no carotid bruit, no JVD, supple, symmetrical, trachea midline and thyroid not enlarged, symmetric, no tenderness/mass/nodules Back: symmetric, no curvature. ROM normal. No CVA tenderness. Resp: clear to auscultation bilaterally Cardio: regular rate and rhythm, S1, S2 normal, no murmur, click, rub or gallop GI: soft, non-tender; bowel sounds normal; no masses,  no organomegaly Extremities: extremities normal, atraumatic, no cyanosis or edema Pulses: 2+ and symmetric Skin: Skin color, texture, turgor normal. No rashes or lesions Neurologic: quicker to respond.  Moves all 4. Feet less sensitive. dtr's increased. Better insight and awareness.  Speech clearer. Incision/Wound:  2/18- exam  Assessment/Plan: 1. Functional deficits secondary to anoxic BI/ deconditioning which require 3+ hours per day of interdisciplinary therapy in a comprehensive inpatient rehab setting. Physiatrist is providing close team supervision and 24 hour management of active medical problems listed below. Physiatrist and rehab team continue to assess barriers to discharge/monitor patient progress toward functional and medical goals. FIM: FIM - Bathing Bathing Steps Patient Completed: Chest;Right Arm;Right upper leg;Left upper leg;Right lower leg (including foot);Left lower leg (including foot) Bathing: 4: Min-Patient completes 8-9 30f 10 parts or 75+ percent  FIM - Upper Body Dressing/Undressing Upper body dressing/undressing steps patient completed: Thread/unthread right bra strap;Thread/unthread left bra strap;Thread/unthread right sleeve of pullover shirt/dresss;Thread/unthread left sleeve of pullover shirt/dress;Put head through opening of pull over shirt/dress;Pull shirt over trunk Upper body dressing/undressing: 4: Min-Patient completed 75 plus % of tasks FIM - Lower Body Dressing/Undressing Lower body dressing/undressing steps patient completed: Thread/unthread right underwear leg;Thread/unthread left underwear leg;Pull underwear up/down;Thread/unthread right pants leg;Thread/unthread left pants leg;Pull pants up/down;Don/Doff right sock;Don/Doff left sock Lower body dressing/undressing: 3: Mod-Patient completed 50-74% of tasks  FIM - Toileting Toileting steps completed by patient: Performs perineal hygiene Toileting Assistive Devices: Grab bar or rail for support Toileting: 2: Max-Patient completed 1 of 3 steps  FIM - Diplomatic Services operational officer Devices: Psychiatrist Transfers: 3-To toilet/BSC: Mod A (lift or lower assist)  FIM - Banker Devices: Bed rails Bed/Chair Transfer: 4:  Supine > Sit: Min A (steadying Pt. > 75%/lift 1 leg);1: Bed > Chair or W/C: Total A (helper does all/Pt. < 25%)  FIM - Locomotion:  Wheelchair Distance: 30 Locomotion: Wheelchair: 1: Travels less than 50 ft with maximal assistance (Pt: 25 - 49%) FIM - Locomotion: Ambulation Locomotion: Ambulation Assistive Devices: Parallel bars Ambulation/Gait Assistance: 1: +1 Total assist Locomotion: Ambulation: 1: Travels less than 50 ft with maximal assistance (Pt: 25 - 49%)  Comprehension Comprehension Mode: Auditory Comprehension: 5-Understands complex 90% of the time/Cues < 10% of the time  Expression Expression Mode: Verbal Expression: 2-Expresses basic 25 - 49% of the time/requires cueing 50 - 75% of the time. Uses single words/gestures.  Social Interaction Social Interaction: 2-Interacts appropriately 25 - 49% of time - Needs frequent redirection.  Problem Solving Problem Solving: 4-Solves basic 75 - 89% of the time/requires cueing 10 - 24% of the time  Memory Memory: 4-Recognizes or recalls 75 - 89% of the time/requires cueing 10 - 24% of the time   . 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 DVT Prophylaxis/Anticoagulation: Subcutaneous heparin. Monitor platelet counts and any signs of bleeding  3. seizure disorder. Depakote 1000 mg twice a day. Monitor for any seizure activity  4. Renal failure/MPGN. Mgt per [employer]. Weigh patient daily. Followup chemistries today showed some improvement 5. Hypertension. Norvasc 10 mg daily, clonidine patch 0.3 mg weekly, Isordil 10 mg 3 times daily, Lopressor twice a day, minoxidil 10 mg daily.  -bp's less labile currently 6. Anemia. Continue iron supplement. Followup CBC  7. Persistent vague foot pain.  Appears to neuropathic.  Also has myoclonus.  Tramadol trial has helped.  Don't think she'll tolerate any more lyrica nor will she be able to purchase it once she goes home.   LOS (Days) 3 A FACE TO FACE EVALUATION  WAS PERFORMED  Heather Sims T 05/11/2011, 8:19 AM

## 2011-05-11 NOTE — Progress Notes (Signed)
Physical Therapy Note  Patient Details  Name: Heather Sims MRN: [id number] Date of Birth: 02/17/1991 Today's Date: 05/11/2011  0940-1010 (30 minutes) individual Pain: no complaint of pain Focus of treatment: sit to stand /standing tolerance Treatment: Sit to stand using parallel bars to assist mod assist X 2; pt able to stand X 2 for 2-3.5 minutes with mod assist for balance /blocking bilateral knees for safety.   Heather Sims,JIM 05/11/2011, 9:54 AM

## 2011-05-11 NOTE — Progress Notes (Signed)
Speech Language Pathology Daily Session Note  Patient Details  Name: Heather Sims MRN: [id number] Date of Birth: 1990-12-16  Today's Date: 05/11/2011 Time: 1308-6578 Time Calculation (min): 45 min  Short Term Goals:  SLP Short Term Goal 1 (Week 1): Pt will follow basic commands with Mod verbal cues SLP Short Term Goal 2 (Week 1): Pt will utilize call bell to express basic wants/needs with Mod A verbal and question cues SLP Short Term Goal 3 (Week 1): Pt will demonstrate functional problem solving for basic and familiar tasks with Mod A verbal cues SLP Short Term Goal 4 (Week 1): Pt will demonstrate sustained attention to tasks for ~5 mins with Mod A verbal cues for redirection SLP Short Term Goal 5 (Week 1): pt will utilzie external aids to increase recall of new, daily information with Mod verbal and semantic cues.  Skilled Therapeutic Interventions: Treatment focus on sustained attention and functional problem solving. Pt needed Min A verbal cues for functional problem solving with grooming tasks and Mod A verbal cues for thoroughness. Pt with increased social interaction and oriented to date with Mod A verbal and visual cues. Followed 1 step commands with Mod A semantic questioning cues during functional tasks and sustained attention for ~30 minutes with Mod A question cues for redirection. Recalled question to ask therapist after 30 minute delay.    Daily Session FIM:  Comprehension Comprehension: 4-Understands basic 75 - 89% of the time/requires cueing 10 - 24% of the time Expression Expression Mode: Verbal Expression: 3-Expresses basic 50 - 74% of the time/requires cueing 25 - 50% of the time. Needs to repeat parts of sentences. Social Interaction Social Interaction: 3-Interacts appropriately 50 - 74% of the time - May be physically or verbally inappropriate. Problem Solving Problem Solving: 3-Solves basic 50 - 74% of the time/requires cueing 25 - 49% of the time Memory Memory:  3-Recognizes or recalls 50 - 74% of the time/requires cueing 25 - 49% of the time  Pain Pain Assessment Pain Assessment: No/denies pain  Therapy/Group: Individual Therapy  Aleiah Mohammed 05/11/2011, 9:41 AM

## 2011-05-11 NOTE — Progress Notes (Signed)
Social Work Assessment and Plan Assessment and Plan  Patient Name: Heather Sims  Today's Date: 05/11/2011  Problem List:  Patient Active Problem List  Diagnoses  . AKI (acute kidney injury)  . Acute heart failure  . Hypoxemia  . Pneumonia  . Anemia  . Hypertension  . ? Viral Cardiomyopathy  . Anoxic brain injury  . Renal failure (ARF), acute on chronic  . Seizures    Past Medical History:  Past Medical History  Diagnosis Date  . Pneumonia last 2 weeks    'walking pneumonia'  . Hypertension   . Seizures   . Chronic kidney disease   . Anemia     Past Surgical History:  Past Surgical History  Procedure Date  . Renal biopsy     Discharge Planning  Discharge Planning Living Arrangements: Alone (was living alone PTA and attending school) Support Systems: Parent Assistance Needed: None needed PTA Do you have any problems obtaining your medications?: No Type of Residence: Private residence Home Care Services: No Patient expects to be discharged to:: Home with parents Expected Discharge Date:  (TBD) Case Management Consult Needed: Yes (Comment) (following)  Social/Family/Support Systems Social/Family/Support Systems Patient Roles: Other (Comment) (Student at [employer]) Anticipated Caregiver: mother, Rogelia Boga and father, Fayrene Fearing Anticipated Caregiver's Contact Information: Stacie Glaze, (H) [phone] (C) [phone] and Aldean Ast (Deeann Dowse Ability/Limitations of Caregiver: Mom is unemployed and available to provide 24/7 Caregiver Availability: 24/7  Employment Status Employment Status Employment 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 No noted s/s of depression or emotional distress but will monitor. Depression screen not indicated at this time. Recent Psychosocial Issues: None prior to initial admission to 03/26/11 Pyschiatric History: none  Patient/Family Perceptions, Expectations & Goals Pt/Family Perceptions, Expectations and Goals Pt/Family understanding of illness & functional limitations: Mother with very basic understanding of pt's medical issues, yet frustrated with limited information from stay at [facility]. See prior SW assessment Premorbid pt/family roles/activities: attending [employer] full-time Anticipated changes in roles/activities/participation: Pt will be d/c'ing home with parents and likely require 24/7 assistance Pt/family expectations/goals: Mother understands pt will  require 24/7 but hopes for her to regain as much independence as possible  Careers adviser: None Premorbid Home Care/DME Agencies: None Transportation available at discharge: yes Resource referrals recommended: Neuropsychology;Support group (specify)  Discharge Assessment Discharge Planning Insurance Resources:  (Medicaid and SSD apps pending) Financial Resources: Family Support (SSD apps pending) Financial Screen Referred: Previously completed Living Expenses: Lives with family Money Management: Family Home Management: family Patient/Family Preliminary Plans: pt to d/c home with parents and sibling  Clinical Impression:  Unfortunate young woman here after MI and anoxic BI as well as other medical issues.  Mother extremely supportive and staying at hospital.  Will monitor for emotional adjustment issues for both pt and family.  Roxanne Gates 05/11/2011

## 2011-05-11 NOTE — Progress Notes (Signed)
Subjective: Interval History: none.  Objective: Vital signs in last 24 hours: Temp:  [98.6 F (37 C)-98.7 F (37.1 C)] 98.6 F (37 C) (02/18 1610) Pulse Rate:  [74-86] 86  (02/18 0608) Resp:  [16-17] 17  (02/18 0608) BP: (150-157)/(88-97) 157/88 mmHg (02/18 0608) SpO2:  [96 %-100 %] 100 % (02/18 0608) Weight:  [56.337 kg (124 lb 3.2 oz)] 56.337 kg (124 lb 3.2 oz) (02/18 9604) Weight change: 0.037 kg (1.3 oz)  Intake/Output from previous day: 02/17 0701 - 02/18 0700 In: 900 [P.O.:900] Out: -  Intake/Output this shift: Total I/O In: 120 [P.O.:120] Out: -   General appearance: slowed mentation and will not answer questions, lethargic Chest wall: no tenderness Cardio: S1, S2 normal and systolic murmur: holosystolic 2/6, blowing at apex GI: soft, pos bs, liver down 4 cm Extremities: edema 2 plus  Lab Results:  [facility] 05/11/11 0640  WBC 13.1*  HGB 9.0*  HCT 29.1*  PLT 207   BMET:  Basename 05/11/11 0640  NA 141  K 4.7  CL 107  CO2 25  GLUCOSE 83  BUN 44*  CREATININE 2.21*  CALCIUM 9.1   No results found for this basename: PTH:2 in the last 72 hours Iron Studies: No results found for this basename: IRON,TIBC,TRANSFERRIN,FERRITIN in the last 72 hours  Studies/Results: No results found.  I have reviewed the patient's current medications.  Assessment/Plan: 1 CKD 4, vol xs, ? What was done in [facility] for GN 2 HTN will add diuretics and ACEI 3 Poor mentation 4 Deconditioning  P lasix, lisinopril, follow Cr    LOS: 3 days   Lam Bjorklund L 05/11/2011,1:32 PM

## 2011-05-11 NOTE — Progress Notes (Addendum)
Occupational Therapy Session Note  Patient Details  Name: Heather Sims MRN: [id number] Date of Birth: 10-09-1990  Today's Date: 05/11/2011 Time: 1100-1200 Time Calculation (min): 60 min   Skilled Therapeutic Interventions/Progress Updates:    1:1 self care retraining at sink level: focus on attention to task, sitting balance, sequencing and task organization, sit to stand, initiation, standing balance with bilateral UE support, standing tolerance with core muscle contraction, confidence in functional mobility. Pt was mod to Max to perform sit to stand, and required OT to be close in physical proximity for confidence (to decrease fear). Pt with increased humor and personality compared to last admission.  Therapy Documentation Precautions:  Precautions Precautions: Fall Restrictions Weight Bearing Restrictions: No    Pain:  c/o sensitivity in bilateral feet  See FIM for current functional status  Therapy/Group: Individual Therapy  [name] 05/11/2011, 3:19 PM

## 2011-05-11 NOTE — Progress Notes (Signed)
Physical Therapy Note  Patient Details  Name: Heather Sims MRN: [id number] Date of Birth: 05/06/1990 Today's Date: 05/11/2011  Time: 1430-1517 47 minutes  Pt c/o pain on L LE with wt bearing, eased with rest.  Transfer training bed <> w/c with max A, manual facilitation for wt shifts forward, motor planning and sequencing.  Pt fatigues easily and requires frequent rests between repetitions.  Standing in parallel bars wt shifts with mod-max manual cues for wt shifts L due to pain/discomfort.  Pt with noted increased tremors B UE and LE in standing.  Gait training 3 x 5' in parallel bars with manual cues for wt shifts laterally and anterior, pt tends to extend trunk backward due to ? Extensor tone vs anxiety.  Individual therapy   Ottie Neglia 05/11/2011, 3:19 PM

## 2011-05-12 ENCOUNTER — Inpatient Hospital Stay (HOSPITAL_COMMUNITY): Payer: Medicaid Other

## 2011-05-12 DIAGNOSIS — Z8669 Personal history of other diseases of the nervous system and sense organs: Secondary | ICD-10-CM

## 2011-05-12 DIAGNOSIS — G931 Anoxic brain damage, not elsewhere classified: Secondary | ICD-10-CM

## 2011-05-12 LAB — URINE MICROSCOPIC-ADD ON

## 2011-05-12 LAB — URINALYSIS, ROUTINE W REFLEX MICROSCOPIC
Bilirubin Urine: NEGATIVE
Nitrite: NEGATIVE
Specific Gravity, Urine: 1.025 (ref 1.005–1.030)
pH: 5.5 (ref 5.0–8.0)

## 2011-05-12 LAB — COMPREHENSIVE METABOLIC PANEL
ALT: <5 U/L (ref 0–35)
Calcium: 8.7 mg/dL (ref 8.4–10.5)
GFR calc Af Amer: 34 mL/min — ABNORMAL LOW (ref >90)
Glucose, Bld: 92 mg/dL (ref 70–99)
Sodium: 141 mEq/L (ref 135–145)
Total Protein: 5.9 g/dL — ABNORMAL LOW (ref 6.0–8.3)

## 2011-05-12 LAB — CBC
MCHC: 31.5 g/dL (ref 30.0–36.0)
RDW: 18.9 % — ABNORMAL HIGH (ref 11.5–15.5)

## 2011-05-12 LAB — PHOSPHORUS: Phosphorus: 4.8 mg/dL — ABNORMAL HIGH (ref 2.3–4.6)

## 2011-05-12 LAB — IRON AND TIBC
Saturation Ratios: 17 % — ABNORMAL LOW (ref 20–55)
TIBC: 207 ug/dL — ABNORMAL LOW (ref 250–470)
UIBC: 171 ug/dL (ref 125–400)

## 2011-05-12 MED ORDER — MINOXIDIL 2.5 MG PO TABS
5.0000 mg | ORAL_TABLET | Freq: Two times a day (BID) | ORAL | Status: DC
  Administered 2011-05-14 – 2011-05-27 (×27): 5 mg via ORAL
  Filled 2011-05-12 (×33): qty 2

## 2011-05-12 MED ORDER — DIPHENHYDRAMINE HCL 25 MG PO CAPS: 25.0000 mg | ORAL_CAPSULE | Freq: Four times a day (QID) | ORAL | Status: DC | PRN

## 2011-05-12 MED ORDER — FERUMOXYTOL INJECTION 510 MG/17 ML
510.0000 mg | Freq: Once | INTRAVENOUS | Status: AC
  Administered 2011-05-12: 510 mg via INTRAVENOUS
  Filled 2011-05-12: qty 17

## 2011-05-12 MED ORDER — NYSTATIN 100000 UNIT/ML MT SUSP
5.0000 mL | Freq: Four times a day (QID) | OROMUCOSAL | Status: DC
  Administered 2011-05-12 – 2011-05-27 (×53): 500000 [IU] via ORAL
  Filled 2011-05-12 (×65): qty 5

## 2011-05-12 MED ORDER — FLUCONAZOLE 100 MG PO TABS
100.0000 mg | ORAL_TABLET | Freq: Every day | ORAL | Status: DC
  Administered 2011-05-12: 100 mg via ORAL
  Filled 2011-05-12 (×3): qty 1

## 2011-05-12 NOTE — Progress Notes (Signed)
Speech Language Pathology Daily Session Note  Patient Details  Name: Heather Sims MRN: [id number] Date of Birth: 12-21-1990  Today's Date: 05/12/2011 Time: 4098-1191 Time Calculation (min): 45 min  Short Term Goals:  SLP Short Term Goal 1 (Week 1): Pt will follow basic commands with Mod verbal cues SLP Short Term Goal 2 (Week 1): Pt will utilize call bell to express basic wants/needs with Mod A verbal and question cues SLP Short Term Goal 3 (Week 1): Pt will demonstrate functional problem solving for basic and familiar tasks with Mod A verbal cues SLP Short Term Goal 4 (Week 1): Pt will demonstrate sustained attention to tasks for ~5 mins with Mod A verbal cues for redirection SLP Short Term Goal 5 (Week 1): pt will utilzie external aids to increase recall of new, daily information with Mod verbal and semantic cues.  Skilled Therapeutic Interventions: Pt difficult to arouse and needed Max verbal cues to attend to task of taking medications. Pt given medications and was pocketing and holding them and needed Max A verbal cues and liquid washes to clear oral residue/pureed textures. Pt reported difficulty swallowing today and could not finish taking her medications. Pt's tongue appeared swollen today and decreased her overall intelligibility throughout session. Pt's mother was expressing concerns throughout treatment about change in intelligibility and overall function. RN and PA notified.   Daily Session FIM:  Comprehension Comprehension Mode: Auditory Comprehension: 3-Understands basic 50 - 74% of the time/requires cueing 25 - 50%  of the time Expression Expression Mode: Verbal Expression: 2-Expresses basic 25 - 49% of the time/requires cueing 50 - 75% of the time. Uses single words/gestures. Social Interaction Social Interaction: 2-Interacts appropriately 25 - 49% of time - Needs frequent redirection. Problem Solving Problem Solving: 2-Solves basic 25 - 49% of the time - needs direction  more than half the time to initiate, plan or complete simple activities Memory Memory: 2-Recognizes or recalls 25 - 49% of the time/requires cueing 51 - 75% of the time FIM - Eating Eating Activity: 4: Help with managing cup/glass  Pain Pain Assessment Pain Assessment: No/denies pain  Therapy/Group: Individual Therapy  Swade Shonka 05/12/2011, 2:28 PM

## 2011-05-12 NOTE — Progress Notes (Signed)
Occupational Therapy Session Note  Patient Details  Name: Heather Sims MRN: [id number] Date of Birth: Sep 30, 1990  Today's Date: 05/12/2011 Time: 0102-7253 Time Calculation (min): 30 min  Short Term Goals: Week 1:  OT Short Term Goal 1 (Week 1): Pt will transfer to the toilet with min A consistantly OT Short Term Goal 2 (Week 1): Pt would perform sit to stand with mod A for clothing management OT Short Term Goal 3 (Week 1): Pt would bathe at shower level with mod A OT Short Term Goal 4 (Week 1): Pt would transfer into tub  with tub bench with mod A OT Short Term Goal 5 (Week 1): Pt would demonstrate day to day recall with min A and external aids  Skilled Therapeutic Interventions/Progress Updates:    Pt in recliner with eyes closed but easily aroused.  Pt required min verbal cues to keep eyes open.  Pt continues to exhibit slurred speech with deceased phonation during session.  Pt unable to keep feet raised off floor for extended period of time to allow therapist to roll recliner into day room to engage in activity.  Pt doffed socks without assistance to explain the pain in her feet.  Pt donned socks without assistance on command.  Attempted to engage pt in conversation but pt unable to attend greater than 15 secs.  Increased swelling noted in right foot.  RN notified.  Therapy Documentation Precautions:  Precautions Precautions: Fall Restrictions Weight Bearing Restrictions: No  Pain: Pain Assessment Pain Assessment: No/denies pain (pt c/o tenderness in BLE during tx)  See FIM for current functional status  Therapy/Group: Individual Therapy  Rich Brave 05/12/2011, 3:07 PM

## 2011-05-12 NOTE — Progress Notes (Signed)
Patient ID: Heather Sims, female   DOB: Nov 02, 1990, 21 y.o.   MRN: [id number] Subjective/Complaints: Review of Systems  Musculoskeletal: Positive for joint pain.  Neurological: Positive for tingling and tremors.  2/19--no new issues.  Still waxes and wanes a bit from a behavioral/arousal standpoint.  Objective: Vital Signs: Blood pressure 167/89, pulse 95, temperature 98 F (36.7 C), temperature source Oral, resp. rate 20, height 5\' 2"  (1.575 m), weight 57 kg (125 lb 10.6 oz), last menstrual period 03/24/2011, SpO2 92.00%. No results found.  Basename 05/12/11 0705 05/11/11 0640  WBC 19.5* 13.1*  HGB 9.4* 9.0*  HCT 29.8* 29.1*  PLT 169 207    Basename 05/11/11 0640  NA 141  K 4.7  CL 107  CO2 25  GLUCOSE 83  BUN 44*  CREATININE 2.21*  CALCIUM 9.1   CBG (last 3)  No results found for this basename: GLUCAP:3 in the last 72 hours  Wt Readings from Last 3 Encounters:  05/12/11 57 kg (125 lb 10.6 oz)  05/07/11 58 kg (127 lb 13.9 oz)  04/28/11 56.79 kg (125 lb 3.2 oz)    Physical Exam:  General appearance: more alert today. Head: Normocephalic, without obvious abnormality, atraumatic Eyes: conjunctivae/corneas clear. PERRL, EOM's intact. Fundi benign. Ears: normal TM's and external ear canals both ears Nose: Nares normal. Septum midline. Mucosa normal. No drainage or sinus tenderness. Throat: lips, mucosa, and tongue normal; teeth and gums normal Neck: no adenopathy, no carotid bruit, no JVD, supple, symmetrical, trachea midline and thyroid not enlarged, symmetric, no tenderness/mass/nodules Back: symmetric, no curvature. ROM normal. No CVA tenderness. Resp: clear to auscultation bilaterally Cardio: regular rate and rhythm, S1, S2 normal, no murmur, click, rub or gallop GI: soft, non-tender; bowel sounds normal; no masses,  no organomegaly Extremities: extremities normal, atraumatic, no cyanosis or edema Pulses: 2+ and symmetric Skin: Skin color, texture, turgor normal.  No rashes or lesions Neurologic: quicker to respond.  Moves all 4. Feet less sensitive. dtr's increased. Better insight and awareness. Speech clearer. Incision/Wound:  2/19- exam  Assessment/Plan: 1. Functional deficits secondary to anoxic BI/ deconditioning which require 3+ hours per day of interdisciplinary therapy in a comprehensive inpatient rehab setting. Physiatrist is providing close team supervision and 24 hour management of active medical problems listed below. Physiatrist and rehab team continue to assess barriers to discharge/monitor patient progress toward functional and medical goals. FIM: FIM - Bathing Bathing Steps Patient Completed: Chest;Right Arm;Left Arm;Abdomen;Front perineal area;Right lower leg (including foot);Left upper leg;Right upper leg;Left lower leg (including foot) Bathing: 4: Min-Patient completes 8-9 79f 10 parts or 75+ percent  FIM - Upper Body Dressing/Undressing Upper body dressing/undressing steps patient completed: Thread/unthread right sleeve of pullover shirt/dresss;Thread/unthread left sleeve of pullover shirt/dress;Put head through opening of pull over shirt/dress;Pull shirt over trunk Upper body dressing/undressing: 5: Set-up assist to: Obtain clothing/put away FIM - Lower Body Dressing/Undressing Lower body dressing/undressing steps patient completed: Thread/unthread right underwear leg;Thread/unthread left underwear leg;Thread/unthread right pants leg;Thread/unthread left pants leg Lower body dressing/undressing: 3: Mod-Patient completed 50-74% of tasks  FIM - Toileting Toileting steps completed by patient: Performs perineal hygiene Toileting Assistive Devices: Grab bar or rail for support Toileting: 2: Max-Patient completed 1 of 3 steps  FIM - Diplomatic Services operational officer Devices: Psychiatrist Transfers: 3-To toilet/BSC: Mod A (lift or lower assist)  FIM - Banker Devices: Bed  rails Bed/Chair Transfer: 2: Sit > Supine: Max A (lifting assist/Pt. 25-49%) (Simultaneous filing. User may not have seen previous  data.)  FIM - Locomotion: Wheelchair Distance: 30 Locomotion: Wheelchair: 1: Total Assistance/staff pushes wheelchair (Pt<25%) FIM - Locomotion: Ambulation Locomotion: Ambulation Assistive Devices: Parallel bars Ambulation/Gait Assistance: 1: +1 Total assist Locomotion: Ambulation: 1: Travels less than 50 ft with maximal assistance (Pt: 25 - 49%)  Comprehension Comprehension Mode: Auditory Comprehension: 4-Understands basic 75 - 89% of the time/requires cueing 10 - 24% of the time  Expression Expression Mode: Verbal Expression: 3-Expresses basic 50 - 74% of the time/requires cueing 25 - 50% of the time. Needs to repeat parts of sentences.  Social Interaction Social Interaction: 3-Interacts appropriately 50 - 74% of the time - May be physically or verbally inappropriate.  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 DVT Prophylaxis/Anticoagulation: Subcutaneous heparin. Monitor platelet counts and any signs of bleeding  3. seizure disorder. Depakote 1000 mg twice a day. Monitor for any seizure activity  4. Renal failure/MPGN. Mgt per [employer]. Weigh patient daily. Followup chemistries today showed some improvement 5. Hypertension. Norvasc 10 mg daily, clonidine patch 0.3 mg weekly, Isordil 10 mg 3 times daily, Lopressor twice a day, minoxidil 10 mg daily.  -bp's less labile currently 6. Anemia. Continue iron supplement. Followup CBC  7. Persistent vague foot pain.  Appears to neuropathic.  Also has myoclonus.  Tramadol trial has helped.  Don't think she'll tolerate any more lyrica nor will she be able to purchase it once  she goes home. 8. Leukocytosis- check ua and culture. No other signs of infection.  Has had elevation before.  Recheck in am   LOS (Days) 4 A FACE TO FACE EVALUATION WAS PERFORMED  Meliya Mcconahy T 05/12/2011, 8:01 AM

## 2011-05-12 NOTE — Evaluation (Signed)
Recreational Therapy Assessment and Plan  Patient Details  Name: Heather Sims MRN: [id number] Date of Birth: [birth year]  Rehab Potential: Good ELOS: 3 weeks Assessment Clinical Impression: 21 year old right-handed female with recent diagnosis of hypertension as well as upper respiratory infection which she had been placed on Avelox. Admitted January 3 with sudden onset shortness of breath and cardiorespiratory failure with CPR in the field. She was pulseless for approximately 5 minutes and intubated in the emergency department. Underwent hypothermia protocol and suffered seizures upon rewarming. Cranial CT scan and MRI of the brain showed no acute changes. Echocardiogram with ejection fraction 40% and systolic function moderately reduced. TEE with diffuse hypokinesis ejection fraction 45%. Venous Doppler studies lower extremities were negative. EEG was minimal seizure activity. MRI of cervical thoracic lumbar spine unremarkable. Noted hemoglobin 8.1 and creatinine 2.31 upon admission she was transfused 3 units of packed red blood cells. Renal ultrasound was negative for hydronephrosis. Nephrology consulted felt renal insufficiency due to hypertension. Renal biopsy completed January 18 to complete workup of acute renal insufficiency with results pending. Post operative biopsy with perirenal hematoma and close monitoring with CBC that remained stable. Latest creatinine 1.95 felt to be stabilized. High dose Lasix had been discontinued with stabilizing renal function January 20. Patient had been on subcutaneous heparin for deep vein thrombosis prophylaxis was discontinued January 17 with renal biopsy completed January 18. Placed on valproate 1000 mg every 12 hours per neurology services for seizure disorder. No further seizure activity has been reported. Patient continued to have bouts of myoclonus of the lower extremities improved after being placed on clonazepam. 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 Patient was ultimately admitted to inpatient rehabilitation services January 21 for comprehensive rehabilitation program. Patient has ongoing followup in regards to acute renal failure as well as her malignant hypertension. 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 Her creatinine gradually continued to worsen. Her Lasix had been discontinued on February 2. Followup renal services advised patient to be transferred to [facility] Dr.Nachman who is an expert in the field on glomerular disease. Patient was transferred to [facility] February 5 for further management regards to her renal function. At [facility] she was given a fluid challenge of 2 L over 24 hours after which patient made adequate urine output per old records. She did receive a renal sonogram that showed no structural uropathy and a small right peri-nephric hematoma compatible with post renal biopsy. A 2-D echocardiogram completed with ejection fraction of 50% and mild diastolic dysfunction. 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 Her latest creatinine at [facility] was 2.9 and followup once arrived to [facility] creatinine 3.2. No further workup was indicated this patient was transferred back to [facility] on February 13 for ongoing medical care and consideration of inpatient rehabilitation services to be resumed. She had been requiring moderate to max assist for activities of daily living and minimal assistance to ambulate 20 feet with a rolling walker for overall mobility and max assist for her safety and awareness prior to her discharge to [facility]. Physical medicine and rehabilitation was again consulted for followup and plan was to readmit back inpatient rehabilitation services for ongoing therapy and maximize her overall mobility.   Pt presents with decreased activity tolerance,  decreased functional mobility, decreased balance, decreased cognition limiting pt's independence with leisure/community pursuits.   Plan Min 1 time per  week >  Recommendations for other services: None  Discharge Criteria: Patient will be discharged from TR if patient refuses treatment 3 consecutive times without medical reason.  If treatment goals not met, if there is a change in medical status, if patient makes no progress towards goals or if patient is discharged from hospital.  The above assessment, treatment plan, treatment alternatives and goals were discussed and mutually agreed upon: by patient  Melainie Krinsky 05/12/2011, 8:17 AM

## 2011-05-12 NOTE — Progress Notes (Signed)
Occupational Therapy Session Note  Patient Details  Name: Heather Sims MRN: [id number] Date of Birth: February 23, 1991  Today's Date: 05/12/2011 Time: 1100-1155 Time Calculation (min): 55 min  Short Term Goals: Week 1:  OT Short Term Goal 1 (Week 1): Pt will transfer to the toilet with min A consistantly OT Short Term Goal 2 (Week 1): Pt would perform sit to stand with mod A for clothing management OT Short Term Goal 3 (Week 1): Pt would bathe at shower level with mod A OT Short Term Goal 4 (Week 1): Pt would transfer into tub  with tub bench with mod A OT Short Term Goal 5 (Week 1): Pt would demonstrate day to day recall with min A and external aids  Skilled Therapeutic Interventions/Progress Updates:    Pt in bed with mother at bedside.  Pt difficult to arouse requiring max verbal and tactile cues including cold wash cloth to face.  Pt exhibited slurred speech and decreased phonation.  Pt declined bathing and dressing but agreed to brush teeth seated at sink.  Pt engaged in activities in gym requiring assembling objects.  Pt partially completed tasks and then deconstructed objects.  Pt exhibited difficulty keeping eyes open throughout session.  Pt returned to room and transferred to recliner with Mom in room.  Therapy Documentation Precautions:  Precautions Precautions: Fall Restrictions Weight Bearing Restrictions: No  Pain: Pain Assessment Pain Assessment: No/denies pain  See FIM for current functional status  Therapy/Group: Individual Therapy  Rich Brave 05/12/2011, 12:02 PM

## 2011-05-12 NOTE — Progress Notes (Signed)
Physical Therapy Session Note  Patient Details  Name: Heather Sims MRN: [id number] Date of Birth: September 25, 1990  Today's Date: 05/12/2011 Time: 1300-1408 Time Calculation (min): 68 min       Skilled Therapeutic Interventions/Progress Updates: Mother present for tx; father present for part of tx, observed.  Treatment focused on mobility, sitting balance, w/c propulsion, and pre-gait activities. Recliner> w/c transfer squat/pivot to R with max A due to truncal ataxia, LLE myoclonus, difficulty with initiating/ following1 step commands.  W/C> mat transfer to L with max A.  Dynamic sitting balance with feet supported, reaching with either hand for item, placing laterally out of base of support, with close supervision. Pre-gait activities in parallel bars for LLE neuromuscular re-ed via forced use, tactile and verbal cues.  Pt unable to sustain standing > 2 seconds due to myoclonic activity LLE, x 3 trials.  Attempted standing using steps with rails, but pt's LLE myoclonus could not be inhibited, and pt was in pain due to the spasms.  Therapeutic ex sitting in w/c, using Kinetron for reciprocal marching, with tactile and verbal cues to initiate LLE extension, improved with practice, x 5 minutes with short rest breaks. W/C mobiltity using bil LEs  for reciprocal alternating movements, with mod/max A, verbal cues, demonstration.  With practice, pt exhibited functional stepping with feet. Pt more alert after w/c mobility; recognized PA and requested a Coke with ice and straw.   Pt was resistant  to transferring from w/c back to recliner.  Pt very lethargic again; left in w/c with mother in attendance.      Therapy Documentation Precautions:  Precautions Precautions: Fall Restrictions Weight Bearing Restrictions: No General: mother reported that pt was more lethargic today than yesterday, and had not been able to swallow all of her pills earlier in day. Pt oriented to self only.   Vital Signs: Therapy  Vitals Pulse Rate: 81  BP: 131/81 mmHg Pain: Pain Assessment Pain Assessment: No/denies pain (during tx, conveyed pain LLE due to myoclonus) Mobility: Transfers Sit to Stand: 2: Max assist Sit to Stand Details: Manual facilitation for weight shifting;Verbal cues for sequencing Squat Pivot Transfers: 2: Max Designer, industrial/product Details: Verbal cues for sequencing;Manual facilitation for weight shifting         See FIM for current functional status  Therapy/Group: Individual Therapy  Anela Bensman 05/12/2011, 2:43 PM

## 2011-05-12 NOTE — Progress Notes (Signed)
Subjective: Interval History: No c/o  Objective: Vital signs in last 24 hours: Temp:  [98 F (36.7 C)-99.2 F (37.3 C)] 98 F (36.7 C) (02/19 0544) Pulse Rate:  [76-95] 95  (02/19 0544) Resp:  [18-20] 20  (02/19 0544) BP: (145-167)/(89-93) 167/89 mmHg (02/19 0544) SpO2:  [92 %-99 %] 92 % (02/19 0544) Weight:  [57 kg (125 lb 10.6 oz)] 57 kg (125 lb 10.6 oz) (02/19 0544) Weight change: 0.663 kg (1 lb 7.4 oz)  Intake/Output from previous day: 02/18 0701 - 02/19 0700 In: 600 [P.O.:600] Out: -  Intake/Output this shift:    General appearance: awakens wth stimulus only and falls asleep Resp: diminished breath sounds bilaterally and rales bibasilar Cardio: S1, S2 normal and systolic murmur: holosystolic 2/6, blowing at apex GI: soft, non-tender; bowel sounds normal; no masses,  no organomegaly Extremities: edema 2+  Lab Results:  Edward [place] 05/12/11 0705 05/11/11 0640  WBC 19.5* 13.1*  HGB 9.4* 9.0*  HCT 29.8* 29.1*  PLT 169 207   BMET:  Basename 05/12/11 0705 05/11/11 0640  NA 141 141  K 4.1 4.7  CL 108 107  CO2 23 25  GLUCOSE 92 83  BUN 45* 44*  CREATININE 2.30* 2.21*  CALCIUM 8.7 9.1   No results found for this basename: PTH:2 in the last 72 hours Iron Studies:  Basename 05/12/11 0705  IRON 36*  TIBC 207*  TRANSFERRIN --  FERRITIN --    Studies/Results: No results found.  I have reviewed the patient's current medications.  Assessment/Plan: 1 CKD vol xs. ? I &O.  Bp better.  Cr stable on ACEI  2 HTN better with meds, not sure why Minox is once a day with short t 1/2. 3 Anemia  Low fe, give iv. 4 anoxic enceph per rehab. 5 HPTH P 6   P change Minoxidil.  Diuretics, Fe/   LOS: 4 days   Seva Chancy L 05/12/2011,2:22 PM

## 2011-05-13 ENCOUNTER — Inpatient Hospital Stay (HOSPITAL_COMMUNITY): Payer: Medicaid Other

## 2011-05-13 DIAGNOSIS — R5381 Other malaise: Secondary | ICD-10-CM

## 2011-05-13 DIAGNOSIS — J159 Unspecified bacterial pneumonia: Secondary | ICD-10-CM

## 2011-05-13 DIAGNOSIS — N179 Acute kidney failure, unspecified: Secondary | ICD-10-CM

## 2011-05-13 DIAGNOSIS — G931 Anoxic brain damage, not elsewhere classified: Secondary | ICD-10-CM

## 2011-05-13 DIAGNOSIS — Z5189 Encounter for other specified aftercare: Secondary | ICD-10-CM

## 2011-05-13 LAB — GLUCOSE, CAPILLARY

## 2011-05-13 LAB — CBC
HCT: 27.8 % — ABNORMAL LOW (ref 36.0–46.0)
Hemoglobin: 8.9 g/dL — ABNORMAL LOW (ref 12.0–15.0)
RDW: 19.3 % — ABNORMAL HIGH (ref 11.5–15.5)
WBC: 24.6 10*3/uL — ABNORMAL HIGH (ref 4.0–10.5)

## 2011-05-13 LAB — RENAL FUNCTION PANEL
CO2: 23 mEq/L (ref 19–32)
GFR calc Af Amer: 28 mL/min — ABNORMAL LOW (ref >90)
Glucose, Bld: 80 mg/dL (ref 70–99)
Phosphorus: 4.8 mg/dL — ABNORMAL HIGH (ref 2.3–4.6)
Potassium: 4 mEq/L (ref 3.5–5.1)
Sodium: 142 mEq/L (ref 135–145)

## 2011-05-13 LAB — URINE CULTURE

## 2011-05-13 MED ORDER — FLUCONAZOLE 100MG IVPB
100.0000 mg | INTRAVENOUS | Status: DC
  Administered 2011-05-13 – 2011-05-16 (×4): 100 mg via INTRAVENOUS
  Filled 2011-05-13 (×7): qty 50

## 2011-05-13 MED ORDER — FAMOTIDINE IN NACL 20-0.9 MG/50ML-% IV SOLN
20.0000 mg | INTRAVENOUS | Status: DC
  Administered 2011-05-13 – 2011-05-16 (×4): 20 mg via INTRAVENOUS
  Filled 2011-05-13 (×5): qty 50

## 2011-05-13 MED ORDER — SODIUM CHLORIDE 0.45 % IV SOLN
INTRAVENOUS | Status: DC
  Administered 2011-05-13 – 2011-05-15 (×3): via INTRAVENOUS

## 2011-05-13 MED ORDER — ACETAMINOPHEN 650 MG RE SUPP
650.0000 mg | Freq: Four times a day (QID) | RECTAL | Status: DC | PRN
  Filled 2011-05-13 (×2): qty 1

## 2011-05-13 MED ORDER — MOXIFLOXACIN HCL IN NACL 400 MG/250ML IV SOLN
400.0000 mg | INTRAVENOUS | Status: DC
  Administered 2011-05-13: 400 mg via INTRAVENOUS
  Filled 2011-05-13 (×2): qty 250

## 2011-05-13 MED ORDER — DIPHENHYDRAMINE HCL 50 MG/ML IJ SOLN: 25.0000 mg | Freq: Four times a day (QID) | INTRAMUSCULAR | Status: DC | PRN

## 2011-05-13 MED ORDER — FUROSEMIDE 10 MG/ML IJ SOLN
40.0000 mg | Freq: Two times a day (BID) | INTRAMUSCULAR | Status: DC
  Administered 2011-05-14: 40 mg via INTRAVENOUS
  Filled 2011-05-13 (×3): qty 4

## 2011-05-13 MED ORDER — FUROSEMIDE 10 MG/ML IJ SOLN
80.0000 mg | Freq: Two times a day (BID) | INTRAMUSCULAR | Status: DC
  Administered 2011-05-13: 80 mg via INTRAVENOUS
  Filled 2011-05-13 (×2): qty 8

## 2011-05-13 MED ORDER — VALPROATE SODIUM 500 MG/5ML IV SOLN
500.0000 mg | Freq: Four times a day (QID) | INTRAVENOUS | Status: DC
  Administered 2011-05-13 – 2011-05-17 (×15): 500 mg via INTRAVENOUS
  Filled 2011-05-13 (×18): qty 5

## 2011-05-13 NOTE — Progress Notes (Signed)
Occupational Therapy Session Note  Patient Details  Name: Heather Sims MRN: [id number] Date of Birth: 03-13-1991  Today's Date: 05/13/2011 Time: 1000-1045 Time Calculation (min): 45 min  Short Term Goals: Week 1:  OT Short Term Goal 1 (Week 1): Pt will transfer to the toilet with min A consistantly OT Short Term Goal 2 (Week 1): Pt would perform sit to stand with mod A for clothing management OT Short Term Goal 3 (Week 1): Pt would bathe at shower level with mod A OT Short Term Goal 4 (Week 1): Pt would transfer into tub  with tub bench with mod A OT Short Term Goal 5 (Week 1): Pt would demonstrate day to day recall with min A and external aids  Skilled Therapeutic Interventions/Progress Updates:    1:1 Pt very lethargic at this time resulting in her ability to fully participate in therapy. Pt continues to have an enlarged tongue and lips with difficulty to keep closed and produce quality phonation. Pt's gums bleeding and mouth with discolored gums. Perform tooth brushing with suction- dry skin, pocketed food and thrush. Pt with difficulty keeping eyes open. Changed shirt in recliner and transferred back to bed total A +2 to change pants. Pt with increased posterior lean/ pushing, difficulty holding items in her hand and following 1 step directions. RN aware  Therapy Documentation Precautions:  Precautions Precautions: Fall Precaution Comments: extends trunk at times, severe myoclonus today Restrictions Weight Bearing Restrictions: No General: General Amount of Missed OT Time (min): 15 Minutes (secondary to lethary) Pain: Pain Assessment Pain Assessment: No/denies pain  See FIM for current functional status  Therapy/Group: Individual Therapy  [name] 05/13/2011, 2:22 PM

## 2011-05-13 NOTE — Progress Notes (Addendum)
Patient's vital signs taken, 1520: T oral 101.2, BP 165/89, P 87, R 20, O2 sat 93% room air. CBG 72. Patient resting. Mother updated on plan of care. Mother questions about whether medications will be administered IV due to patient not alert and swallowing. Emotional support given to mother. Addendum: 1620 rectal temp 101.2. Tylenol 650 mg suppository given.  Hedy Camara

## 2011-05-13 NOTE — Progress Notes (Signed)
Subjective: Interval History: none.  Objective: Vital signs in last 24 hours: Temp:  [97.4 F (36.3 C)] 97.4 F (36.3 C) (02/20 0611) Pulse Rate:  [81-92] 85  (02/20 0611) Resp:  [20] 20  (02/20 0611) BP: (131-164)/(81-96) 147/92 mmHg (02/20 0611) SpO2:  [88 %] 88 % (02/20 0611) Weight:  [54 kg (119 lb 0.8 oz)] 54 kg (119 lb 0.8 oz) (02/20 0611) Weight change: -3 kg (-6 lb 9.8 oz)  Intake/Output from previous day: 02/19 0701 - 02/20 0700 In: 480 [P.O.:480] Out: 1 [Urine:1] Intake/Output this shift: Total I/O In: 60 [P.O.:60] Out: -   General appearance: slowed mentation and awakens easily  Resp: clear to auscultation bilaterally Cardio: S1, S2 normal and systolic murmur: holosystolic 2/6, blowing at apex GI: liver down 4 cm  Lab Results:  Basename 05/13/11 0533 05/12/11 0705  WBC 24.6* 19.5*  HGB 8.9* 9.4*  HCT 27.8* 29.8*  PLT 149* 169   BMET:  Basename 05/13/11 0533 05/12/11 0705  NA 142 141  K 4.0 4.1  CL 108 108  CO2 23 23  GLUCOSE 80 92  BUN 47* 45*  CREATININE 2.71* 2.30*  CALCIUM 8.5 8.7   No results found for this basename: PTH:2 in the last 72 hours Iron Studies:  Basename 05/12/11 0705  IRON 36*  TIBC 207*  TRANSFERRIN --  FERRITIN --    Studies/Results: Ct Head Wo Contrast  05/12/2011  *RADIOLOGY REPORT*  Clinical Data: 21 year old female with altered mental status.  CT HEAD WITHOUT CONTRAST  Technique:  Contiguous axial images were obtained from the base of the skull through the vertex without contrast.  Comparison: Brain MRI of 04/01/2011.  Head CT 03/28/2011.  Findings: Adenoid hypertrophy. Visualized paranasal sinuses and mastoids are clear.  No acute osseous abnormality identified. Visualized orbits and scalp soft tissues are within normal limits.  Ventricle size has mildly increased since 03/28/2011, but the temporal horns are not dilated and there is no evidence of transependymal edema. There is also subtle increased prominence of all  sulci.  No midline shift, mass effect, or evidence of mass lesion.  No acute intracranial hemorrhage identified.  Gray-white matter differentiation is within normal limits throughout the brain.  No evidence of cortically based acute infarction identified.  No suspicious intracranial vascular hyperdensity.  IMPRESSION: Negative noncontrast CT appearance of the brain; Subtle ventricle and sulci enlargement since January may reflect recent malnutrition.  Original Report Authenticated By: Harley Hallmark, M.D.    I have reviewed the patient's current medications.  Assessment/Plan: 1 CKD/CGN vol better, bp better.  Cr ^ mild, follow at this point. 2 HTN better control 3 Anemia Fe 4 Anoxic encephalopathy per rehab P cont med, follow Cr    LOS: 5 days   Wayne Brunker L 05/13/2011,1:31 PM

## 2011-05-13 NOTE — Progress Notes (Signed)
Patient ID: Heather Sims, female   DOB: [birth year], 21 y.o.   MRN: [id number] Yesterday after team conference pt's mother [pt asleep] was given conference report: ELOS 05/27/11 Supervision-min assist goals.  Currently, pt w/ medical issues & mother more concerned about those. Pt w/ lethargy, large tongue impairing speech & swallow, and, decreased therapy participation.   Pt has been started on IV antibx.

## 2011-05-13 NOTE — Progress Notes (Signed)
Coordination with Rapid Response nurse  Rapid response notified to assist in patient assessment by Laural Roes RN.  Patient with decreased LOC and decreased SPO2.  Tongue swollen, difficulty swallowing noted.  After speaking with Roda Shutters RN spoke with Jesusita Oka PA and orders received.  Communicated orders with Berline Lopes RN.

## 2011-05-13 NOTE — Significant Event (Signed)
Patient lethargic and unable to be aroused enough to take morning medication. Medications attempted to be given during speech therapy, patient could not swallow medication or any liquids or puree. IVPB Avelox started. Medications documented not given in [facility]. Durenda Guthrie, PA notified. No new orders at this time. Hedy Camara

## 2011-05-13 NOTE — Progress Notes (Signed)
Speech Language Pathology Daily Session Note  Patient Details  Name: Heather Sims MRN: [id number] Date of Birth: October 27, 1990  Today's Date: 05/13/2011 Time: 1914-7829 Time Calculation (min): 40 min  Short Term Goals:  SLP Short Term Goal 1 (Week 1): Pt will follow basic commands with Mod verbal cues SLP Short Term Goal 2 (Week 1): Pt will utilize call bell to express basic wants/needs with Mod A verbal and question cues SLP Short Term Goal 3 (Week 1): Pt will demonstrate functional problem solving for basic and familiar tasks with Mod A verbal cues SLP Short Term Goal 4 (Week 1): Pt will demonstrate sustained attention to tasks for ~5 mins with Mod A verbal cues for redirection SLP Short Term Goal 5 (Week 1): pt will utilzie external aids to increase recall of new, daily information with Mod verbal and semantic cues.  Skilled Therapeutic Interventions: Pt laying in recliner and needed Max verbal and tactile cues for arousal. Max verbal and tactile cues for focused attention throughout session. Pt's tongue and lips appear swollen, dry and cracked and gums were also bleeding. Pt unable to stay alert long enough to take medications. Oral care performed by clinician and pt had large amounts of food pocketed in her oral cavity. Pt refused puree textures and was unable to utilize a straw today with thin liquids. Pt had trials of thin liquids via cup without overt s/s of aspiration but with large amounts of anterior spillage. Pt's speech ~50% intelligible and pt was extremely slow to respond. RN and PA notified of functional changes seen throughout session.    Daily Session Precautions/Restrictions  Precautions Precautions: Fall Precaution Comments: extends trunk at times, severe myoclonus today FIM:  Comprehension Comprehension: 2-Understands basic 25 - 49% of the time/requires cueing 51 - 75% of the time Expression Expression: 1-Expresses basis less than 25% of the time/requires cueing greater  than 75% of the time. Social Interaction Social Interaction: 2-Interacts appropriately 25 - 49% of time - Needs frequent redirection. Problem Solving Problem Solving: 1-Solves basic less than 25% of the time - needs direction nearly all the time or does not effectively solve problems and may need a restraint for safety Memory Memory: 1-Recognizes or recalls less than 25% of the time/requires cueing greater than 75% of the time Pain Pain Assessment Pain Assessment: No/denies pain  Therapy/Group: Individual Therapy  Cruz Devilla 05/13/2011, 11:13 AM

## 2011-05-13 NOTE — Progress Notes (Addendum)
Physical Therapy Session Note  Patient Details  Name: Heather Sims MRN: [id number] Date of Birth: [birth year]  Today's Date: 05/13/2011 Time: 0806-0900 Time Calculation (min): 54 min  Short Term Goals: See eval  Skilled Therapeutic Interventions/Progress Updates:    rolling to don pants, pt unable to bridge but did try to assist in pulling them up. MD reports to do therapy as tolerate today, tongue very swollen and pt with increased trunk and BLE myoclonus.  Pt with decreased head control today and tending to hyperextend upper trunk, even in standing frame.  Pt asked "why arent you picking me up?" when placed in standing frame, education provided. Played tic tac toe, difficulty riting in specific blocks but successful with min cues.  Therapy Documentation Precautions:  Precautions Precautions: Fall Precaution Comments: extends trunk at times, severe myoclonus today Restrictions Weight Bearing Restrictions: No    Vital Signs: Therapy Vitals Temp: 97.4 F (36.3 C) Temp src: Oral Pulse Rate: 85  Resp: 20  BP: 147/92 mmHg Patient Position, if appropriate: Lying Oxygen Therapy SpO2: 88 % O2 Device: None (Room air) Pain: Pain Assessment Pain Assessment: No/denies pain Mobility: Bed Mobility Rolling Left: 3: Mod assist;With rail Rolling Left Details: Verbal cues for technique Supine to Sit: 2: Max assist Supine to Sit Details: Verbal cues for technique Sitting - Scoot to Edge of Bed: 2: Max Education officer, museum Transfers: 2: Max assist;1: +2 Total assist (depending on pt resistance, +2 w/c to recliner)     Trunk/Postural Assessment : Cervical Assessment Cervical Assessment: Exceptions to [facility] Cervical Strength Overall Cervical Strength Comments: decreased head control in w/c, falling R and back, positioned in recliner to support head after treatnment for ST session       Other Treatments: Treatments Therapeutic Activity: standing in standing frame for weight  bearing, writing on mirror to encourage forward trunk  See FIM for current functional status  Therapy/Group: Individual Therapy Progress note not filed on 2/20, late entry on 05/18/11 Michaelene Song 05/13/2011, 9:02 AM

## 2011-05-13 NOTE — Progress Notes (Signed)
Patient Details  Name: [name] MRN: [id number] Date of Birth: [birth year]  Today's Date: 05/13/2011 Time: 1500-1530 Skilled Therapeutic Interventions/Progress Updates: Atempted TR session with pt, unable to arouse pt other than for 3-4 seconds.  Pt's skin extremely warm when touched.  Mom had just arrived and was very concerned and tearful about pt's decline.  Emotional support provided.  Gracie, RN in to check on pt and discussed mom's concerns, check temperature.      Colby Catanese 05/13/2011, 4:52 PM

## 2011-05-13 NOTE — Significant Event (Signed)
CXR results called to D. Anguilli, PA. No new orders received at this time. Hedy Camara

## 2011-05-13 NOTE — Progress Notes (Signed)
Asked to assist with assessment of patient with decreased LOC and fever.  On arrival patient supine in bed.  Arouses to loud voice - answers some questions but very weak.  Skin hot to touch.  Rectal temp 101.4.  RR 24 - mouth breathing.  Oral mucosa extremely dry with thick coating on tongue and lips. Tongue protruding slightly - no snoring noted - resps not labored - ? Edema versus extreme dryness.   Extensive oral care given - large amounts of very thick yellow secretions from oral cavity and back of throat.  Patient has weak gag and cough.  Manual BP 178/88  HR 99 O2 sats 88% on RA.  Placed on 2 liter nasal cannula - O2 sats increased to 94-96%. Bil BS present with few scattered Rhonchi.  Abd soft - non -tender to palp.   Repositioned in bed with HOB to 90 degrees.  Patient incontinent urine - moderate wet clothes and bed -  cleaned and repositioned by NT staff.  Patient more alert at times - interacting with mother - although mother states much less responsive today. Speech is appropriate at times but slow and slightly slurred.  Follows commands.  RN states patient not able to swallow med's today - was attempted earlier.  CXR not yet done - called for PCXR instead of 2 view - PA aware.  Patient made strict NPO - suggested oral care every 2 hours - maintain HOB at least at 30 degrees.  Oral suction set up in room for use.  CXR reports called to Jesusita Oka - PA by Janeann Forehand.  Patient maintaining O2 sats - 96% - little more alert.  Will watch with Rehab staff.  UA and blood cx pending.

## 2011-05-13 NOTE — Progress Notes (Signed)
PHARMACY CONSULT FOR CHANGE MEDS FROM PO to IV  Asked by PA to try to change as many meds as possible from po to IV due to patient's current NPO status.  The following meds were changed to IV at 1:1 ratio: Diphenhydramine 25 mg IV q6 PRN Famotidine 20 mg IV daily Fluconazole 100 mg IV daily *Depakote DR 1000 mg BID po changed to valproic acid 500 mg IV q6 hrs.  The following meds were discussed with Mariam Dollar, PA and were changed as follows: Furosemide 160 mg po BID changed to furosemide 80 mg IV BID  The following meds were unable to be changed to IV - have discussed these with Mariam Dollar, PA as well.  He plans to monitor BP for now in absence of routine BP meds. Amlodipine phoslo Vitamin D Clonazepam imdur Lisinopril Minoxidil Metoprolol  Please notify pharmacy if we can be of further assistance.  Thanks!  Reece Leader, Pharm D 05/13/2011 5:33 PM

## 2011-05-13 NOTE — Progress Notes (Signed)
Patient ID: Heather Sims, female   DOB: Mar 17, 1991, 21 y.o.   MRN: [id number] Patient ID: Heather Sims, female   DOB: May 28, 1990, 21 y.o.   MRN: [id number] Subjective/Complaints: Review of Systems  Musculoskeletal: Positive for joint pain.  Neurological: Positive for tingling and tremors.  2/20- more lethargic this am then yesterday afternoon. Had mild cough overnight.  Some urine incontinence also reported.  Objective: Vital Signs: Blood pressure 147/92, pulse 85, temperature 97.4 F (36.3 C), temperature source Oral, resp. rate 20, height 5\' 2"  (1.575 m), weight 54 kg (119 lb 0.8 oz), last menstrual period 03/24/2011, SpO2 88.00%. Ct Head Wo Contrast  05/12/2011  *RADIOLOGY REPORT*  Clinical Data: 20 year old female with altered mental status.  CT HEAD WITHOUT CONTRAST  Technique:  Contiguous axial images were obtained from the base of the skull through the vertex without contrast.  Comparison: Brain MRI of 04/01/2011.  Head CT 03/28/2011.  Findings: Adenoid hypertrophy. Visualized paranasal sinuses and mastoids are clear.  No acute osseous abnormality identified. Visualized orbits and scalp soft tissues are within normal limits.  Ventricle size has mildly increased since 03/28/2011, but the temporal horns are not dilated and there is no evidence of transependymal edema. There is also subtle increased prominence of all sulci.  No midline shift, mass effect, or evidence of mass lesion.  No acute intracranial hemorrhage identified.  Gray-white matter differentiation is within normal limits throughout the brain.  No evidence of cortically based acute infarction identified.  No suspicious intracranial vascular hyperdensity.  IMPRESSION: Negative noncontrast CT appearance of the brain; Subtle ventricle and sulci enlargement since January may reflect recent malnutrition.  Original Report Authenticated By: Harley Hallmark, M.D.    Basename 05/13/11 0533 05/12/11 0705  WBC 24.6* 19.5*  HGB 8.9* 9.4*  HCT  27.8* 29.8*  PLT 149* 169    Basename 05/13/11 0533 05/12/11 0705  NA 142 141  K 4.0 4.1  CL 108 108  CO2 23 23  GLUCOSE 80 92  BUN 47* 45*  CREATININE 2.71* 2.30*  CALCIUM 8.5 8.7   CBG (last 3)  No results found for this basename: GLUCAP:3 in the last 72 hours  Wt Readings from Last 3 Encounters:  05/13/11 54 kg (119 lb 0.8 oz)  05/07/11 58 kg (127 lb 13.9 oz)  04/28/11 56.79 kg (125 lb 3.2 oz)    Physical Exam:  General appearance: more alert today. Head: Normocephalic, without obvious abnormality, atraumatic Eyes: conjunctivae/corneas clear. PERRL, EOM's intact. Fundi benign. Ears: normal TM's and external ear canals both ears Nose: Nares normal. Septum midline. Mucosa normal. No drainage or sinus tenderness. Throat: lips, mucosa, and tongue normal; teeth and gums normal tongue dry with thrush Neck: no adenopathy, no carotid bruit, no JVD, supple, symmetrical, trachea midline and thyroid not enlarged, symmetric, no tenderness/mass/nodules Back: symmetric, no curvature. ROM normal. No CVA tenderness. Resp: perhaps some basilar rales but effort poor Cardio: regular rate and rhythm, S1, S2 normal, no murmur, click, rub or gallop GI: soft, non-tender; bowel sounds normal; no masses,  no organomegaly Extremities: extremities normal, atraumatic, no cyanosis or edema Pulses: 2+ and symmetric Skin: Skin color, texture, turgor normal. No rashes or lesions Neurologic: extremely slow to respond. Feet less sensitive. dtr's increased. Speech barely intelligible. Incision/Wound:  2/20- exam  Assessment/Plan: 1. Functional deficits secondary to anoxic BI/ deconditioning which require 3+ hours per day of interdisciplinary therapy in a comprehensive inpatient rehab setting. Physiatrist is providing close team supervision and 24 hour management of active medical problems  listed below. Physiatrist and rehab team continue to assess barriers to discharge/monitor patient progress toward  functional and medical goals. FIM: FIM - Bathing Bathing Steps Patient Completed: Chest;Right Arm;Left Arm;Abdomen;Front perineal area;Right lower leg (including foot);Left upper leg;Right upper leg;Left lower leg (including foot) Bathing: 4: Min-Patient completes 8-9 18f 10 parts or 75+ percent  FIM - Upper Body Dressing/Undressing Upper body dressing/undressing steps patient completed: Thread/unthread right sleeve of pullover shirt/dresss;Thread/unthread left sleeve of pullover shirt/dress;Put head through opening of pull over shirt/dress;Pull shirt over trunk Upper body dressing/undressing: 5: Set-up assist to: Obtain clothing/put away FIM - Lower Body Dressing/Undressing Lower body dressing/undressing steps patient completed: Thread/unthread right underwear leg;Thread/unthread left underwear leg;Thread/unthread right pants leg;Thread/unthread left pants leg Lower body dressing/undressing: 3: Mod-Patient completed 50-74% of tasks  FIM - Toileting Toileting steps completed by patient: Performs perineal hygiene Toileting Assistive Devices: Grab bar or rail for support Toileting: 1: Total-Patient completed zero steps, helper did all 3  FIM - Diplomatic Services operational officer Devices: Psychiatrist Transfers: 3-From toilet/BSC: Mod A (lift or lower assist)  FIM - Banker Devices: Bed rails Bed/Chair Transfer: 3: Chair or W/C > Bed: Mod A (lift or lower assist)  FIM - Locomotion: Wheelchair Distance: 30 Locomotion: Wheelchair: 2: Travels 50 - 149 ft with maximal assistance (Pt: 25 - 49%) FIM - Locomotion: Ambulation Locomotion: Ambulation Assistive Devices: Parallel bars Ambulation/Gait Assistance: 1: +1 Total assist Locomotion: Ambulation: 0: Activity did not occur  Comprehension Comprehension Mode: Auditory Comprehension: 2-Understands basic 25 - 49% of the time/requires cueing 51 - 75% of the time  Expression Expression  Mode: Verbal Expression: 1-Expresses basis less than 25% of the time/requires cueing greater than 75% of the time.  Social Interaction Social Interaction: 2-Interacts appropriately 25 - 49% of time - Needs frequent redirection.  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 DVT Prophylaxis/Anticoagulation: Subcutaneous heparin. Monitor platelet counts and any signs of bleeding  3. seizure disorder. Depakote 1000 mg twice a day. Monitor for any seizure activity  4. Renal failure/MPGN. Mgt per [employer]. Weigh patient daily.   -no significant change in CR 5. Hypertension. Norvasc 10 mg daily, clonidine patch 0.3 mg weekly, Isordil 10 mg 3 times daily, Lopressor twice a day, minoxidil 10 mg daily.  -bp's less labile currently 6. Anemia. Continue iron supplement. Followup CBC  7. Persistent vague foot pain.  Appears to neuropathic.  Also has myoclonus.  Tramadol trial has helped.  Don't think she'll tolerate any more lyrica nor will she be able to purchase it once she goes home. 8. Leukocytosis- WBC now 24k. Will begin iv avelox to cover urine and chest.  -check cxr and blood cultures today. Urine culture still pending.  -diflucan/nystatin for thrush   LOS (Days) 5 A FACE TO FACE EVALUATION WAS PERFORMED  Fielding Mault T 05/13/2011, 8:13 AM

## 2011-05-13 NOTE — Patient Care Conference (Signed)
Inpatient RehabilitationTeam Conference Note Date: 05/12/2011   Time:  2:49 PM    Patient Name: Heather Sims      Medical Record Number: [id number]  Date of Birth: [birth year] Sex: Female         Room/Bed: 4029/4029-01 Payor Info: Payor:     Admitting Diagnosis: ANOXIC BI  Admit Date/Time:  05/08/2011  4:36 PM Admission Comments: No comment available   Primary Diagnosis:  <principal problem not specified> Principal Problem: <principal problem not specified>  Patient Active Problem List  Diagnoses Date Noted  . Anoxic brain damage 05/12/2011  . Renal failure (ARF), acute on chronic 05/06/2011  . Seizures 05/06/2011  . Anoxic brain injury 04/14/2011  . Hypertension 04/07/2011  . ? Viral Cardiomyopathy 04/07/2011  . AKI (acute kidney injury) 03/26/2011  . Acute heart failure 03/26/2011  . Hypoxemia 03/26/2011  . Pneumonia 03/26/2011  . Anemia 03/26/2011    Expected Discharge Date: Expected Discharge Date: 05/27/11  Team Members Present: Physician: Dr. Faith Rogue Case Manager Present: Melanee Spry, RN Social Worker Present: Amada Jupiter, LCSW Nurse Present: Carmie End, RN;Gracie Alessandra Bevels, RN PT Present: Elvia Collum, PT OT Present: Roney Mans, Loistine Chance, OT SLP Present: Feliberto Gottron, SLP Other (Discipline and Name): Tora Duck, PPS Coordinator     Current Status/Progress Goal Weekly Team Focus  Medical   renal function stabilizing, more alert yesterday and now more lethargic today  renal and infecitious stability  see above   Bowel/Bladder   continent bowel/bladder  remain continent  up to bathroom for toileting   Swallow/Nutrition/ Hydration   Regular textures and thin liquids, decreased PO intake, difficulty taking medications today   N/A  N/A   ADL's   total A  supervision  activity tolerance, attention   Mobility   max A  min-mod A  activity tolerance, functional movement patterns   Communication   Min A  Min A  expression of wants/needs     Safety/Cognition/ Behavioral Observations  Min-Mod A  Min A  problem solving, attention   Pain   bilateral lower extremity pain  pain < 3  monitor for new onset of pain and effectiveness of medication   Skin   n/a   no new breakdown  protective skin care products, turn q2h and prn, monitor skin condition every shift      *See Interdisciplinary Assessment and Plan and progress notes for long and short-term goals  Barriers to Discharge: cognitivion and waxing and waning mental status    Possible Resolutions to Barriers:  check for causes of cognitive fluctuation    Discharge Planning/Teaching Needs:  home with mother to provide 24/7 assistance      Team Discussion: Tongue swollen causing swallow & speech problems.  Checking for UTI.  Checking head CT.   Revisions to Treatment Plan: none    Continued Need for Acute Rehabilitation Level of Care: The patient requires daily medical management by a physician with specialized training in physical medicine and rehabilitation for the following conditions: Daily direction of a multidisciplinary physical rehabilitation program to ensure safe treatment while eliciting the highest outcome that is of practical value to the patient.: Yes Daily medical management of patient stability for increased activity during participation in an intensive rehabilitation regime.: Yes Daily analysis of laboratory values and/or radiology reports with any subsequent need for medication adjustment of medical intervention for : Neurological problems;Other  Brock Ra 05/13/2011, 2:14 PM

## 2011-05-14 LAB — COMPREHENSIVE METABOLIC PANEL
ALT: <5 U/L (ref 0–35)
AST: 15 U/L (ref 0–37)
Albumin: 1.7 g/dL — ABNORMAL LOW (ref 3.5–5.2)
Alkaline Phosphatase: 56 U/L (ref 39–117)
Glucose, Bld: 80 mg/dL (ref 70–99)
Potassium: 4.6 mEq/L (ref 3.5–5.1)
Sodium: 145 mEq/L (ref 135–145)
Total Protein: 5.9 g/dL — ABNORMAL LOW (ref 6.0–8.3)

## 2011-05-14 LAB — DIFFERENTIAL
Basophils Absolute: 0 10*3/uL (ref 0.0–0.1)
Basophils Relative: 0 % (ref 0–1)
Eosinophils Absolute: 0.3 10*3/uL (ref 0.0–0.7)
Lymphs Abs: 1.8 10*3/uL (ref 0.7–4.0)
Neutro Abs: 20.2 10*3/uL — ABNORMAL HIGH (ref 1.7–7.7)

## 2011-05-14 LAB — CBC
HCT: 30.4 % — ABNORMAL LOW (ref 36.0–46.0)
MCH: 30.6 pg (ref 26.0–34.0)
MCHC: 32.2 g/dL (ref 30.0–36.0)
MCV: 95 fL (ref 78.0–100.0)
RDW: 19.3 % — ABNORMAL HIGH (ref 11.5–15.5)

## 2011-05-14 MED ORDER — LISINOPRIL 20 MG PO TABS
20.0000 mg | ORAL_TABLET | Freq: Every day | ORAL | Status: DC
  Filled 2011-05-14: qty 1

## 2011-05-14 MED ORDER — FUROSEMIDE 80 MG PO TABS
160.0000 mg | ORAL_TABLET | Freq: Two times a day (BID) | ORAL | Status: DC
  Administered 2011-05-14 – 2011-05-27 (×26): 160 mg via ORAL
  Filled 2011-05-14 (×29): qty 2

## 2011-05-14 MED ORDER — PIPERACILLIN-TAZOBACTAM 3.375 G IVPB
3.3750 g | Freq: Three times a day (TID) | INTRAVENOUS | Status: DC
  Administered 2011-05-15 – 2011-05-19 (×12): 3.375 g via INTRAVENOUS
  Filled 2011-05-14 (×16): qty 50

## 2011-05-14 MED ORDER — LOSARTAN POTASSIUM 50 MG PO TABS
100.0000 mg | ORAL_TABLET | Freq: Two times a day (BID) | ORAL | Status: DC
  Administered 2011-05-14 – 2011-05-15 (×3): 100 mg via ORAL
  Administered 2011-05-15: 50 mg via ORAL
  Administered 2011-05-16 – 2011-05-26 (×21): 100 mg via ORAL
  Filled 2011-05-14 (×29): qty 2

## 2011-05-14 MED ORDER — METHYLPREDNISOLONE SODIUM SUCC 40 MG IJ SOLR
20.0000 mg | INTRAMUSCULAR | Status: AC
  Administered 2011-05-14: 10:00:00 via INTRAVENOUS
  Filled 2011-05-14: qty 0.5

## 2011-05-14 MED ORDER — PIPERACILLIN-TAZOBACTAM 3.375 G IVPB
3.3750 g | Freq: Three times a day (TID) | INTRAVENOUS | Status: DC
  Administered 2011-05-14: 3.375 g via INTRAVENOUS
  Filled 2011-05-14 (×3): qty 50

## 2011-05-14 MED ORDER — METOPROLOL TARTRATE 1 MG/ML IV SOLN
5.0000 mg | Freq: Four times a day (QID) | INTRAVENOUS | Status: DC
  Administered 2011-05-14 – 2011-05-17 (×14): 5 mg via INTRAVENOUS
  Filled 2011-05-14 (×18): qty 5

## 2011-05-14 NOTE — Plan of Care (Signed)
Problem: RH KNOWLEDGE DEFICIT Goal: RH STG INCREASE KNOWLEDGE OF HYPERTENSION Outcome: Progressing Educate patient and mother about hypertension

## 2011-05-14 NOTE — Progress Notes (Signed)
Physical Therapy Note  Patient Details  Name: Piper Hassebrock MRN: [id number] Date of Birth: 1990-11-20 Today's Date: 05/14/2011  Time: 1345-1430 45 minutes  No c/o pain.  L LE remains tender to touch but eases when not touched.  Bed mobility supine <> sit with max A due to muscle spasms, decreased trunk and extremity control.  Seated EOB x 30 minutes with supervision with 1 to 0 UE support to participate in [place] card game.  Pt required mod-max questioning cues to play game, matching colors and numbers.  Pt with decreased fine motor coordination for drawing cards, holding and organizing cards in hand.  Pt limited by UE muscle tremors.  Pt with sustained attention to task 1-2 minutes before requiring cues for attention.  Scooting edge of bed with max-total assist for forward wt shift, wt bearing in LEs to assist.  Pt limited by muscle tremors and anxiety about standing.  Pt expresses concern over "not being fed".  Given a drink of coke per RN with supervision, no cough noted after drinking.  Pt disoriented to situation, oriented pt with little carryover/awareness of situation noted.  Individual therapy   Josef Tourigny 05/14/2011, 2:35 PM

## 2011-05-14 NOTE — Progress Notes (Signed)
Occupational Therapy Note  Patient Details  Name: Heather Sims MRN: [id number] Date of Birth: 07/12/1990 Today's Date: 05/14/2011  Pt missed of OT today secondary to refusal to do an activity. Reports being upset about not being able to eat.   [name] 05/14/2011, 11:56 AM

## 2011-05-14 NOTE — Progress Notes (Signed)
Subjective/Complaints: Review of Systems  Musculoskeletal: Positive for joint pain.  Neurological: Positive for tingling and tremors.  2/21--more alert today.  Mouth and tongue still swollen a bit but speech a little better.  Objective: Vital Signs: Blood pressure 196/115, pulse 76, temperature 99.3 F (37.4 C), temperature source Oral, resp. rate 20, height 5\' 2"  (1.575 m), weight 54.7 kg (120 lb 9.5 oz), last menstrual period 03/24/2011, SpO2 91.00%. Ct Head Wo Contrast  05/12/2011  *RADIOLOGY REPORT*  Clinical Data: 21 year old female with altered mental status.  CT HEAD WITHOUT CONTRAST  Technique:  Contiguous axial images were obtained from the base of the skull through the vertex without contrast.  Comparison: Brain MRI of 04/01/2011.  Head CT 03/28/2011.  Findings: Adenoid hypertrophy. Visualized paranasal sinuses and mastoids are clear.  No acute osseous abnormality identified. Visualized orbits and scalp soft tissues are within normal limits.  Ventricle size has mildly increased since 03/28/2011, but the temporal horns are not dilated and there is no evidence of transependymal edema. There is also subtle increased prominence of all sulci.  No midline shift, mass effect, or evidence of mass lesion.  No acute intracranial hemorrhage identified.  Gray-white matter differentiation is within normal limits throughout the brain.  No evidence of cortically based acute infarction identified.  No suspicious intracranial vascular hyperdensity.  IMPRESSION: Negative noncontrast CT appearance of the brain; Subtle ventricle and sulci enlargement since January may reflect recent malnutrition.  Original Report Authenticated By: Harley Hallmark, M.D.   Dg Chest Port 1 View  05/13/2011  *RADIOLOGY REPORT*  Clinical Data: Shortness of breath.  Pulmonary edema.  PORTABLE CHEST - 1 VIEW  Comparison: 04/21/2011 and multiple previous  Findings: There is chronic enlargement cardiac silhouette.  There is venous  hypertension with mild diffuse pulmonary edema.  There is focal density in the left lower lobe consistent with atelectasis and/or pneumonia.  IMPRESSION: Persistent enlargement of the cardiac silhouette.  Mild diffuse edema.  Focal density in the left lower lobe consistent with atelectasis and/or pneumonia.  Original Report Authenticated By: Thomasenia Sales, M.D.    Basename 05/14/11 0557 05/13/11 0533  WBC 25.1* 24.6*  HGB 9.8* 8.9*  HCT 30.4* 27.8*  PLT 135* 149*    Basename 05/14/11 0557 05/13/11 0533  NA 145 142  K 4.6 4.0  CL 108 108  CO2 21 23  GLUCOSE 80 80  BUN 53* 47*  CREATININE 2.87* 2.71*  CALCIUM 8.6 8.5   CBG (last 3)   Basename 05/13/11 1557  GLUCAP 72    Wt Readings from Last 3 Encounters:  05/14/11 54.7 kg (120 lb 9.5 oz)  05/07/11 58 kg (127 lb 13.9 oz)  04/28/11 56.79 kg (125 lb 3.2 oz)    Physical Exam:  General appearance: more alert today. Head: Normocephalic, without obvious abnormality, atraumatic Eyes: conjunctivae/corneas clear. PERRL, EOM's intact. Fundi benign. Ears: normal TM's and external ear canals both ears Nose: Nares normal. Septum midline. Mucosa normal. No drainage or sinus tenderness. Throat: throat with yellow, thick drainage at roof/corners of mouth. Tongue still swollen with some debris on tongue itself. I had a hard time seeing deeper into pharynx with my tongue depressor  Neck: no adenopathy, no carotid bruit, no JVD, supple, symmetrical, trachea midline and thyroid not enlarged, symmetric, no tenderness/mass/nodules Back: symmetric, no curvature. ROM normal. No CVA tenderness. Resp: perhaps some basilar rales but effort poor Cardio: regular rate and rhythm, S1, S2 normal, no murmur, click, rub or gallop GI: soft, non-tender; bowel sounds  normal; no masses,  no organomegaly Extremities: extremities normal, atraumatic, no cyanosis or edema Pulses: 2+ and symmetric Skin: Skin color, texture, turgor normal. No rashes or  lesions Neurologic: extremely slow to respond. Feet less sensitive. dtr's increased. Speech barely intelligible. Incision/Wound:  2/21- exam  Assessment/Plan: 1. Functional deficits secondary to anoxic BI/ deconditioning which require 3+ hours per day of interdisciplinary therapy in a comprehensive inpatient rehab setting. Physiatrist is providing close team supervision and 24 hour management of active medical problems listed below. Physiatrist and rehab team continue to assess barriers to discharge/monitor patient progress toward functional and medical goals. FIM: FIM - Bathing Bathing Steps Patient Completed: Chest;Right Arm;Left Arm;Abdomen;Front perineal area;Right lower leg (including foot);Left upper leg;Right upper leg;Left lower leg (including foot) Bathing: 4: Min-Patient completes 8-9 64f 10 parts or 75+ percent  FIM - Upper Body Dressing/Undressing Upper body dressing/undressing steps patient completed: Thread/unthread right sleeve of pullover shirt/dresss;Thread/unthread left sleeve of pullover shirt/dress;Put head through opening of pull over shirt/dress;Pull shirt over trunk Upper body dressing/undressing: 5: Set-up assist to: Obtain clothing/put away FIM - Lower Body Dressing/Undressing Lower body dressing/undressing steps patient completed: Thread/unthread right underwear leg;Thread/unthread left underwear leg;Thread/unthread right pants leg;Thread/unthread left pants leg Lower body dressing/undressing: 3: Mod-Patient completed 50-74% of tasks  FIM - Toileting Toileting steps completed by patient: Performs perineal hygiene Toileting Assistive Devices: Grab bar or rail for support Toileting: 1: Total-Patient completed zero steps, helper did all 3  FIM - Diplomatic Services operational officer Devices: Therapist, music Transfers: 1-Two helpers Event organiser DO NOT USE: 3-From toilet/BSC: Mod A (lift or lower assist)  FIM - Landscape architect Devices: Bed rails Bed/Chair Transfer: 1: Two helpers  FIM - Locomotion: Wheelchair Distance: 30 Locomotion: Wheelchair: 2: Travels 50 - 149 ft with maximal assistance (Pt: 25 - 49%) FIM - Locomotion: Ambulation Locomotion: Ambulation Assistive Devices: Parallel bars Ambulation/Gait Assistance: 1: +1 Total assist Locomotion: Ambulation: 0: Activity did not occur  Comprehension Comprehension Mode: Auditory;Visual Comprehension: 2-Understands basic 25 - 49% of the time/requires cueing 51 - 75% of the time  Expression Expression Mode: Verbal Expression: 2-Expresses basic 25 - 49% of the time/requires cueing 50 - 75% of the time. Uses single words/gestures.  Social Interaction Social Interaction: 2-Interacts appropriately 25 - 49% of time - Needs frequent redirection.  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 DVT Prophylaxis/Anticoagulation: Subcutaneous heparin. Monitor platelet counts and any signs of bleeding  3. seizure disorder. Depakote 1000 mg twice a day. Monitor for any seizure activity  4. Renal failure/MPGN. Mgt per [employer]. Weigh patient daily.   -no significant change in CR 5. Hypertension. Norvasc 10 mg daily, clonidine patch 0.3 mg weekly, Isordil 10 mg 3 times daily, Lopressor twice a day, minoxidil 10 mg daily.  -bp's less labile currently  -change all meds to iv if she can't take any po 6. Anemia. Continue iron supplement. Followup CBC  7. Persistent vague foot pain.  Appears to neuropathic.  Also has myoclonus.  Tramadol trial has helped.  Don't think she'll tolerate any more lyrica nor will she be able to purchase it once she goes home. 8. Leukocytosis- WBC  25k.(which is somewhat stable)  -  pt feels a little better today but tongue still swollen and whole mouth/throat sore  -LLL pneumonia on CXR--avelox started yesterday  -oral exam today concerning for an epiglottiis/pharyntitis/abscess  -change abx to zosyn to help cover both areas  -steroids x1 iv to decrease swelling  -ivf to continue  -culture secretion/fluid at back of mouth  -will request ent consult   LOS (Days) 6 A FACE TO FACE EVALUATION WAS PERFORMED  Elane Peabody T 05/14/2011, 7:34 AM

## 2011-05-14 NOTE — Progress Notes (Signed)
Subjective: Interval History: none.  Objective: Vital signs in last 24 hours: Temp:  [99.3 F (37.4 C)-101.2 F (38.4 C)] 99.3 F (37.4 C) (02/21 0630) Pulse Rate:  [76-88] 76  (02/21 0630) Resp:  [20-24] 20  (02/21 0630) BP: (165-201)/(89-121) 196/115 mmHg (02/21 0630) SpO2:  [91 %-97 %] 91 % (02/21 0630) Weight:  [54.7 kg (120 lb 9.5 oz)-54.9 kg (121 lb 0.5 oz)] 54.7 kg (120 lb 9.5 oz) (02/21 0630) Weight change: 0.9 kg (1 lb 15.8 oz)  Intake/Output from previous day: 02/20 0701 - 02/21 0700 In: 60 [P.O.:60] Out: 2 [Urine:2] Intake/Output this shift:    General appearance: slowed mentation and noncommunicative Resp: rales bibasilar Cardio: regular rate and rhythm, S1, S2 normal, S4 present and systolic murmur: holosystolic 2/6, blowing at apex GI: liver down 4 cm, pos bs  Lab Results:  [facility] 05/14/11 0557 05/13/11 0533  WBC 25.1* 24.6*  HGB 9.8* 8.9*  HCT 30.4* 27.8*  PLT 135* 149*   BMET:  Basename 05/14/11 0557 05/13/11 0533  NA 145 142  K 4.6 4.0  CL 108 108  CO2 21 23  GLUCOSE 80 80  BUN 53* 47*  CREATININE 2.87* 2.71*  CALCIUM 8.6 8.5    Basename 05/12/11 0705  PTH 23.0   Iron Studies:  Basename 05/12/11 0705  IRON 36*  TIBC 207*  TRANSFERRIN --  FERRITIN --    Studies/Results: Ct Head Wo Contrast  05/12/2011  *RADIOLOGY REPORT*  Clinical Data: 21 year old female with altered mental status.  CT HEAD WITHOUT CONTRAST  Technique:  Contiguous axial images were obtained from the base of the skull through the vertex without contrast.  Comparison: Brain MRI of 04/01/2011.  Head CT 03/28/2011.  Findings: Adenoid hypertrophy. Visualized paranasal sinuses and mastoids are clear.  No acute osseous abnormality identified. Visualized orbits and scalp soft tissues are within normal limits.  Ventricle size has mildly increased since 03/28/2011, but the temporal horns are not dilated and there is no evidence of transependymal edema. There is also subtle  increased prominence of all sulci.  No midline shift, mass effect, or evidence of mass lesion.  No acute intracranial hemorrhage identified.  Gray-white matter differentiation is within normal limits throughout the brain.  No evidence of cortically based acute infarction identified.  No suspicious intracranial vascular hyperdensity.  IMPRESSION: Negative noncontrast CT appearance of the brain; Subtle ventricle and sulci enlargement since January may reflect recent malnutrition.  Original Report Authenticated By: Harley Hallmark, M.D.   Dg Chest Port 1 View  05/13/2011  *RADIOLOGY REPORT*  Clinical Data: Shortness of breath.  Pulmonary edema.  PORTABLE CHEST - 1 VIEW  Comparison: 04/21/2011 and multiple previous  Findings: There is chronic enlargement cardiac silhouette.  There is venous hypertension with mild diffuse pulmonary edema.  There is focal density in the left lower lobe consistent with atelectasis and/or pneumonia.  IMPRESSION: Persistent enlargement of the cardiac silhouette.  Mild diffuse edema.  Focal density in the left lower lobe consistent with atelectasis and/or pneumonia.  Original Report Authenticated By: Thomasenia Sales, M.D.    I have reviewed the patient's current medications.  Assessment/Plan: 1 HTN ? Swelling in throat related to Lisinopril, give Losartan  Needs better control 2 CKD 4 Cr ^ with better bp 3 Pneu per Dr Hermelinda Medicus 4 anoxic enceph 5 Anemia on epo 6 HPTH  p Losartan, epo , lasix    LOS: 6 days   Heather Sims L 05/14/2011,12:29 PM

## 2011-05-14 NOTE — Progress Notes (Signed)
Physical Therapy Note  Patient Details  Name: Heather Sims MRN: [id number] Date of Birth: [birth year] Today's Date: 05/14/2011  1015-1045 (30 minutes) individual Pain- no acute distress noted ; pt lethargic requiring vcs to participate in task Focus of treatment: Therapeutic activities to facilitate increased participation in functional activities Treatment: Donning pants- pt attempted to assist in supine but resists movement of LEs especially RT LE secondary to spasms (max assist); supine to sit mod/max assist to edge of bed. Pt able to sit edge of bed with min to close supervision for approximately 5 minutes before returning to supine secondary to fatigue. ; AA ROM bilateral LEs in supine X 10.  Jaaziah Schulke,JIM 05/14/2011, 10:46 AM

## 2011-05-14 NOTE — Consult Note (Signed)
Reason for Consult: Tongue and throat swelling Referring Physician: Dr. [name] is an 21 y.o. female.  HPI: History of anoxic brain injury. Complaining of swollen tongue and sore throat.  Past Medical History  Diagnosis Date  . Pneumonia last 2 weeks    'walking pneumonia'  . Hypertension   . Seizures   . Chronic kidney disease   . Anemia     Past Surgical History  Procedure Date  . Renal biopsy     No family history on file.  Social History:  reports that she has never smoked. She does not have any smokeless tobacco history on file. She reports that she does not drink alcohol or use illicit drugs.  Allergies: No Known Allergies  Medications: I have reviewed the patient's current medications.  Results for orders placed during the hospital encounter of 05/08/11 (from the past 48 hour(s))  URINE CULTURE     Status: Normal   Collection Time   05/12/11  4:09 PM      Component Value Range Comment   Specimen Description URINE, CLEAN CATCH      Special Requests NONE      Culture  Setup Time [id number]      Colony Count NO GROWTH      Culture NO GROWTH      Report Status 05/13/2011 FINAL     URINALYSIS, ROUTINE W REFLEX MICROSCOPIC     Status: Abnormal   Collection Time   05/12/11  4:09 PM      Component Value Range Comment   Color, Urine YELLOW  YELLOW     APPearance CLEAR  CLEAR     Specific Gravity, Urine 1.025  1.005 - 1.030     pH 5.5  5.0 - 8.0     Glucose, UA NEGATIVE  NEGATIVE (mg/dL)    Hgb urine dipstick LARGE (*) NEGATIVE     Bilirubin Urine NEGATIVE  NEGATIVE     Ketones, ur NEGATIVE  NEGATIVE (mg/dL)    Protein, ur 811 (*) NEGATIVE (mg/dL)    Urobilinogen, UA 0.2  0.0 - 1.0 (mg/dL)    Nitrite NEGATIVE  NEGATIVE     Leukocytes, UA SMALL (*) NEGATIVE    URINE MICROSCOPIC-ADD ON     Status: Abnormal   Collection Time   05/12/11  4:09 PM      Component Value Range Comment   Squamous Epithelial / LPF FEW (*) RARE     WBC, UA 11-20  <3  (WBC/hpf)    RBC / HPF 7-10  <3 (RBC/hpf)    Bacteria, UA FEW (*) RARE     Casts WBC CAST (*) NEGATIVE    RENAL FUNCTION PANEL     Status: Abnormal   Collection Time   05/13/11  5:33 AM      Component Value Range Comment   Sodium 142  135 - 145 (mEq/L)    Potassium 4.0  3.5 - 5.1 (mEq/L)    Chloride 108  96 - 112 (mEq/L)    CO2 23  19 - 32 (mEq/L)    Glucose, Bld 80  70 - 99 (mg/dL)    BUN 47 (*) 6 - 23 (mg/dL)    Creatinine, Ser 9.14 (*) 0.50 - 1.10 (mg/dL)    Calcium 8.5  8.4 - 10.5 (mg/dL)    Phosphorus 4.8 (*) 2.3 - 4.6 (mg/dL)    Albumin 1.7 (*) 3.5 - 5.2 (g/dL)    GFR calc non Af Amer 24 (*) >90 (mL/min)  GFR calc Af Amer 28 (*) >90 (mL/min)   CBC     Status: Abnormal   Collection Time   05/13/11  5:33 AM      Component Value Range Comment   WBC 24.6 (*) 4.0 - 10.5 (K/uL)    RBC 2.90 (*) 3.87 - 5.11 (MIL/uL)    Hemoglobin 8.9 (*) 12.0 - 15.0 (g/dL)    HCT 40.9 (*) 81.1 - 46.0 (%)    MCV 95.9  78.0 - 100.0 (fL)    MCH 30.7  26.0 - 34.0 (pg)    MCHC 32.0  30.0 - 36.0 (g/dL)    RDW 91.4 (*) 78.2 - 15.5 (%)    Platelets 149 (*) 150 - 400 (K/uL)   CULTURE, BLOOD (ROUTINE X 2)     Status: Normal (Preliminary result)   Collection Time   05/13/11  9:07 AM      Component Value Range Comment   Specimen Description BLOOD HAND RIGHT      Special Requests BOTTLES DRAWN AEROBIC AND ANAEROBIC 10.[facility] EACH      Culture  Setup Time [id number]      Culture        Value:        BLOOD CULTURE RECEIVED NO GROWTH TO DATE CULTURE WILL BE HELD FOR 5 DAYS BEFORE ISSUING A FINAL NEGATIVE REPORT   Report Status PENDING     CULTURE, BLOOD (ROUTINE X 2)     Status: Normal (Preliminary result)   Collection Time   05/13/11  9:18 AM      Component Value Range Comment   Specimen Description BLOOD HAND LEFT      Special Requests        Value: BOTTLES DRAWN AEROBIC AND ANAEROBIC 10.0 CC BLUE 3.0CC RED   Culture  Setup Time [id number]      Culture        Value:        BLOOD CULTURE RECEIVED NO  GROWTH TO DATE CULTURE WILL BE HELD FOR 5 DAYS BEFORE ISSUING A FINAL NEGATIVE REPORT   Report Status PENDING     GLUCOSE, CAPILLARY     Status: Normal   Collection Time   05/13/11  3:57 PM      Component Value Range Comment   Glucose-Capillary 72  70 - 99 (mg/dL)    Comment 1 Notify RN     COMPREHENSIVE METABOLIC PANEL     Status: Abnormal   Collection Time   05/14/11  5:57 AM      Component Value Range Comment   Sodium 145  135 - 145 (mEq/L)    Potassium 4.6  3.5 - 5.1 (mEq/L)    Chloride 108  96 - 112 (mEq/L)    CO2 21  19 - 32 (mEq/L)    Glucose, Bld 80  70 - 99 (mg/dL)    BUN 53 (*) 6 - 23 (mg/dL)    Creatinine, Ser 2.44 (*) 0.50 - 1.10 (mg/dL)    Calcium 8.6  8.4 - 10.5 (mg/dL)    Total Protein 5.9 (*) 6.0 - 8.3 (g/dL)    Albumin 1.7 (*) 3.5 - 5.2 (g/dL)    AST 15  0 - 37 (U/L)    ALT <5  0 - 35 (U/L) REPEATED TO VERIFY   Alkaline Phosphatase 56  39 - 117 (U/L)    Total Bilirubin 0.2 (*) 0.3 - 1.2 (mg/dL)    GFR calc non Af Amer 22 (*) >90 (mL/min)    GFR calc  Af Amer 26 (*) >90 (mL/min)   CBC     Status: Abnormal   Collection Time   05/14/11  5:57 AM      Component Value Range Comment   WBC 25.1 (*) 4.0 - 10.5 (K/uL)    RBC 3.20 (*) 3.87 - 5.11 (MIL/uL)    Hemoglobin 9.8 (*) 12.0 - 15.0 (g/dL)    HCT 40.9 (*) 81.1 - 46.0 (%)    MCV 95.0  78.0 - 100.0 (fL)    MCH 30.6  26.0 - 34.0 (pg)    MCHC 32.2  30.0 - 36.0 (g/dL)    RDW 91.4 (*) 78.2 - 15.5 (%)    Platelets 135 (*) 150 - 400 (K/uL)   DIFFERENTIAL     Status: Abnormal   Collection Time   05/14/11  5:57 AM      Component Value Range Comment   Neutrophils Relative 81 (*) 43 - 77 (%)    Lymphocytes Relative 7 (*) 12 - 46 (%)    Monocytes Relative 11  3 - 12 (%)    Eosinophils Relative 1  0 - 5 (%)    Basophils Relative 0  0 - 1 (%)    Neutro Abs 20.2 (*) 1.7 - 7.7 (K/uL)    Lymphs Abs 1.8  0.7 - 4.0 (K/uL)    Monocytes Absolute 2.8 (*) 0.1 - 1.0 (K/uL)    Eosinophils Absolute 0.3  0.0 - 0.7 (K/uL)    Basophils  Absolute 0.0  0.0 - 0.1 (K/uL)    Smear Review MORPHOLOGY UNREMARKABLE     VALPROIC ACID LEVEL     Status: Normal   Collection Time   05/14/11  5:57 AM      Component Value Range Comment   Valproic Acid Lvl 83.2  50.0 - 100.0 (ug/mL)   PHOSPHORUS     Status: Abnormal   Collection Time   05/14/11  5:57 AM      Component Value Range Comment   Phosphorus 6.0 (*) 2.3 - 4.6 (mg/dL)     Ct Head Wo Contrast  05/12/2011  *RADIOLOGY REPORT*  Clinical Data: 21 year old female with altered mental status.  CT HEAD WITHOUT CONTRAST  Technique:  Contiguous axial images were obtained from the base of the skull through the vertex without contrast.  Comparison: Brain MRI of 04/01/2011.  Head CT 03/28/2011.  Findings: Adenoid hypertrophy. Visualized paranasal sinuses and mastoids are clear.  No acute osseous abnormality identified. Visualized orbits and scalp soft tissues are within normal limits.  Ventricle size has mildly increased since 03/28/2011, but the temporal horns are not dilated and there is no evidence of transependymal edema. There is also subtle increased prominence of all sulci.  No midline shift, mass effect, or evidence of mass lesion.  No acute intracranial hemorrhage identified.  Gray-white matter differentiation is within normal limits throughout the brain.  No evidence of cortically based acute infarction identified.  No suspicious intracranial vascular hyperdensity.  IMPRESSION: Negative noncontrast CT appearance of the brain; Subtle ventricle and sulci enlargement since January may reflect recent malnutrition.  Original Report Authenticated By: Harley Hallmark, M.D.   Dg Chest Port 1 View  05/13/2011  *RADIOLOGY REPORT*  Clinical Data: Shortness of breath.  Pulmonary edema.  PORTABLE CHEST - 1 VIEW  Comparison: 04/21/2011 and multiple previous  Findings: There is chronic enlargement cardiac silhouette.  There is venous hypertension with mild diffuse pulmonary edema.  There is focal density in the  left lower lobe consistent with atelectasis  and/or pneumonia.  IMPRESSION: Persistent enlargement of the cardiac silhouette.  Mild diffuse edema.  Focal density in the left lower lobe consistent with atelectasis and/or pneumonia.  Original Report Authenticated By: Thomasenia Sales, M.D.    ROS: History limited due to neurologic status  Blood pressure 196/115, pulse 76, temperature 99.3 F (37.4 C), temperature source Oral, resp. rate 20, height 5\' 2"  (1.575 m), weight 54.7 kg (120 lb 9.5 oz), last menstrual period 03/24/2011, SpO2 91.00%.  PHYSICAL EXAM: General: Sitting in bed, breathing quietly, in no distress. She is nonverbal during my encounter. Head and neck: No palpable neck masses or swelling. External ears and nose looked normal to inspection. The tongue is slightly full and very dry. There is no visible swelling, inflammation or mass visible or palpable. The pharynx is also clear but also dry.  Assessment/Plan: Tongue and throat dryness, no pathologic swelling, signs of infection, or neoplasm. Treat symptomatically. Call me for any additional concerns.  Anadia Helmes H 05/14/2011, 10:22 AM

## 2011-05-14 NOTE — Progress Notes (Signed)
SLP Cancellation Note  Pt missed 45 minutes of skilled SLP treatment. Treatment cancelled today due to medical issues with patient which prohibited therapy.  Heather Sims 05/14/2011, 9:46 AM

## 2011-05-15 ENCOUNTER — Inpatient Hospital Stay (HOSPITAL_COMMUNITY): Payer: Medicaid Other

## 2011-05-15 LAB — CBC
Hemoglobin: 9.9 g/dL — ABNORMAL LOW (ref 12.0–15.0)
MCH: 30.3 pg (ref 26.0–34.0)
MCHC: 32.2 g/dL (ref 30.0–36.0)
Platelets: 160 10*3/uL (ref 150–400)
RBC: 3.27 MIL/uL — ABNORMAL LOW (ref 3.87–5.11)

## 2011-05-15 LAB — DIFFERENTIAL
Basophils Relative: 0 % (ref 0–1)
Eosinophils Absolute: 0.1 10*3/uL (ref 0.0–0.7)
Lymphs Abs: 2.1 10*3/uL (ref 0.7–4.0)
Monocytes Relative: 9 % (ref 3–12)
Neutro Abs: 18.2 10*3/uL — ABNORMAL HIGH (ref 1.7–7.7)
Neutrophils Relative %: 81 % — ABNORMAL HIGH (ref 43–77)

## 2011-05-15 LAB — RENAL FUNCTION PANEL
Albumin: 1.6 g/dL — ABNORMAL LOW (ref 3.5–5.2)
CO2: 21 mEq/L (ref 19–32)
Chloride: 104 mEq/L (ref 96–112)
GFR calc Af Amer: 25 mL/min — ABNORMAL LOW (ref >90)
GFR calc non Af Amer: 22 mL/min — ABNORMAL LOW (ref >90)
Sodium: 139 mEq/L (ref 135–145)

## 2011-05-15 NOTE — Progress Notes (Signed)
Patient ID: Heather Sims, female   DOB: August 25, 1990, 21 y.o.   MRN: [id number] Subjective/Complaints: Review of Systems  Musculoskeletal: Positive for joint pain.  Neurological: Positive for tingling and tremors.  2/22--iv blew out last night. Up a little bit surrounding that issue. Sleepy this am  Objective: Vital Signs: Blood pressure 132/82, pulse 74, temperature 97.9 F (36.6 C), temperature source Oral, resp. rate 19, height 5\' 2"  (1.575 m), weight 54.9 kg (121 lb 0.5 oz), last menstrual period 03/24/2011, SpO2 97.00%. Dg Chest Port 1 View  05/13/2011  *RADIOLOGY REPORT*  Clinical Data: Shortness of breath.  Pulmonary edema.  PORTABLE CHEST - 1 VIEW  Comparison: 04/21/2011 and multiple previous  Findings: There is chronic enlargement cardiac silhouette.  There is venous hypertension with mild diffuse pulmonary edema.  There is focal density in the left lower lobe consistent with atelectasis and/or pneumonia.  IMPRESSION: Persistent enlargement of the cardiac silhouette.  Mild diffuse edema.  Focal density in the left lower lobe consistent with atelectasis and/or pneumonia.  Original Report Authenticated By: Thomasenia Sales, M.D.    Basename 05/15/11 0635 05/14/11 0557  WBC 22.3* 25.1*  HGB 9.9* 9.8*  HCT 30.7* 30.4*  PLT 160 135*    Basename 05/14/11 0557 05/13/11 0533  NA 145 142  K 4.6 4.0  CL 108 108  CO2 21 23  GLUCOSE 80 80  BUN 53* 47*  CREATININE 2.87* 2.71*  CALCIUM 8.6 8.5   CBG (last 3)   Basename 05/13/11 1557  GLUCAP 72    Wt Readings from Last 3 Encounters:  05/15/11 54.9 kg (121 lb 0.5 oz)  05/07/11 58 kg (127 lb 13.9 oz)  04/28/11 56.79 kg (125 lb 3.2 oz)    Physical Exam:  General appearance: more alert today. Head: Normocephalic, without obvious abnormality, atraumatic Eyes: conjunctivae/corneas clear. PERRL, EOM's intact. Fundi benign. Ears: normal TM's and external ear canals both ears Nose: Nares normal. Septum midline. Mucosa normal. No  drainage or sinus tenderness. Throat: throat with yellow, thick drainage at roof/corners of mouth. Tongue still swollen with some debris on tongue itself. I had a hard time seeing deeper into pharynx with my tongue depressor  Neck: no adenopathy, no carotid bruit, no JVD, supple, symmetrical, trachea midline and thyroid not enlarged, symmetric, no tenderness/mass/nodules Back: symmetric, no curvature. ROM normal. No CVA tenderness. Resp: perhaps some basilar rales but effort poor Cardio: regular rate and rhythm, S1, S2 normal, no murmur, click, rub or gallop GI: soft, non-tender; bowel sounds normal; no masses,  no organomegaly Extremities: extremities normal, atraumatic, no cyanosis or edema Pulses: 2+ and symmetric Skin: Skin color, texture, turgor normal. No rashes or lesions Neurologic: extremely slow to respond. Feet less sensitive. dtr's increased. Difficult to arouse today. Incision/Wound:  2/22- exam  Assessment/Plan: 1. Functional deficits secondary to anoxic BI/ deconditioning which require 3+ hours per day of interdisciplinary therapy in a comprehensive inpatient rehab setting. Physiatrist is providing close team supervision and 24 hour management of active medical problems listed below. Physiatrist and rehab team continue to assess barriers to discharge/monitor patient progress toward functional and medical goals. FIM: FIM - Bathing Bathing Steps Patient Completed: Chest;Right Arm;Left Arm;Abdomen;Front perineal area;Right lower leg (including foot);Left upper leg;Right upper leg;Left lower leg (including foot) Bathing: 4: Min-Patient completes 8-9 42f 10 parts or 75+ percent  FIM - Upper Body Dressing/Undressing Upper body dressing/undressing steps patient completed: Thread/unthread right sleeve of pullover shirt/dresss;Thread/unthread left sleeve of pullover shirt/dress;Put head through opening of pull over shirt/dress;Pull shirt  over trunk Upper body dressing/undressing: 5:  Set-up assist to: Obtain clothing/put away FIM - Lower Body Dressing/Undressing Lower body dressing/undressing steps patient completed: Thread/unthread right underwear leg;Thread/unthread left underwear leg;Thread/unthread right pants leg;Thread/unthread left pants leg Lower body dressing/undressing: 3: Mod-Patient completed 50-74% of tasks  FIM - Toileting Toileting steps completed by patient: Performs perineal hygiene Toileting Assistive Devices: Grab bar or rail for support Toileting: 1: Two helpers  FIM - Diplomatic Services operational officer Devices: Therapist, music Transfers: 0-Activity did not Nutritional therapist Transfers DO NOT USE: 3-From toilet/BSC: Mod A (lift or lower assist)  FIM - Banker Devices: Bed rails Bed/Chair Transfer: 2: Sit > Supine: Max A (lifting assist/Pt. 25-49%);2: Supine > Sit: Max A (lifting assist/Pt. 25-49%)  FIM - Locomotion: Wheelchair Distance: 30 Locomotion: Wheelchair: 0: Activity did not occur FIM - Locomotion: Ambulation Locomotion: Ambulation Assistive Devices: Parallel bars Ambulation/Gait Assistance: 1: +1 Total assist Locomotion: Ambulation: 0: Activity did not occur  Comprehension Comprehension Mode: Auditory Comprehension: 2-Understands basic 25 - 49% of the time/requires cueing 51 - 75% of the time  Expression Expression Mode: Verbal Expression: 2-Expresses basic 25 - 49% of the time/requires cueing 50 - 75% of the time. Uses single words/gestures.  Social Interaction Social Interaction: 2-Interacts appropriately 25 - 49% of time - Needs frequent redirection.  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 DVT  Prophylaxis/Anticoagulation: Subcutaneous heparin. Monitor platelet counts and any signs of bleeding  3. seizure disorder. Depakote 1000 mg twice a day. Monitor for any seizure activity  4. Renal failure/MPGN. Mgt per [employer]. Weigh patient daily.   -no significant change in CR 5. Hypertension. Norvasc 10 mg daily, clonidine patch 0.3 mg weekly, Isordil 10 mg 3 times daily, Lopressor twice a day, minoxidil 10 mg daily.  -bp's less labile currently  -have been adjusting meds back and forth between po and iv depending on ability to swallow 6. Anemia. Continue iron supplement. Followup CBC  7. Persistent vague foot pain.  Appears to neuropathic.  Also has myoclonus.  Tramadol trial has helped.  Don't think she'll tolerate any more lyrica nor will she be able to purchase it once she goes home. 8. Leukocytosis- WBC down to 22k.  She's afebrile  -LLL pneumonia on CXR--now on zosyn  -appreciate dr. Lucky Rathke eval  -steroids given x 1  -ivf to continue until her po intake picks up.  -f/u cxr today  -blood cultures negative.  Mouth sample pending   LOS (Days) 7 A FACE TO FACE EVALUATION WAS PERFORMED  Kalani Sthilaire T 05/15/2011, 7:14 AM

## 2011-05-15 NOTE — Progress Notes (Signed)
Subjective: Interval History: has complaints iv site.  Objective: Vital signs in last 24 hours: Temp:  [97.8 F (36.6 C)-98.8 F (37.1 C)] 97.9 F (36.6 C) (02/22 0500) Pulse Rate:  [73-76] 74  (02/22 0500) Resp:  [17-19] 19  (02/22 0500) BP: (132-190)/(82-119) 132/82 mmHg (02/22 0500) SpO2:  [93 %-97 %] 97 % (02/22 0500) Weight:  [54.9 kg (121 lb 0.5 oz)] 54.9 kg (121 lb 0.5 oz) (02/22 0500) Weight change: 0 kg (0 lb)  Intake/Output from previous day:   Intake/Output this shift: Total I/O In: 300 [I.V.:300] Out: -   General appearance: slow speech, often not intelligible, and not coherent Resp: rales bibasilar Cardio: regular rate and rhythm, S1, S2 normal and systolic murmur: holosystolic 2/6, blowing at apex GI: soft, non-tender; bowel sounds normal; no masses,  no organomegaly Extremities: edema 1+ LV lift  Lab Results:  Basename 05/15/11 0635 05/14/11 0557  WBC 22.3* 25.1*  HGB 9.9* 9.8*  HCT 30.7* 30.4*  PLT 160 135*   BMET:  Basename 05/15/11 0500 05/14/11 0557  NA 139 145  K 4.2 4.6  CL 104 108  CO2 21 21  GLUCOSE 96 80  BUN 59* 53*  CREATININE 2.95* 2.87*  CALCIUM 8.3* 8.6   No results found for this basename: PTH:2 in the last 72 hours Iron Studies: No results found for this basename: IRON,TIBC,TRANSFERRIN,FERRITIN in the last 72 hours  Studies/Results: Dg Chest Port 1 View  05/13/2011  *RADIOLOGY REPORT*  Clinical Data: Shortness of breath.  Pulmonary edema.  PORTABLE CHEST - 1 VIEW  Comparison: 04/21/2011 and multiple previous  Findings: There is chronic enlargement cardiac silhouette.  There is venous hypertension with mild diffuse pulmonary edema.  There is focal density in the left lower lobe consistent with atelectasis and/or pneumonia.  IMPRESSION: Persistent enlargement of the cardiac silhouette.  Mild diffuse edema.  Focal density in the left lower lobe consistent with atelectasis and/or pneumonia.  Original Report Authenticated By: Thomasenia Sales, M.D.    I have reviewed the patient's current medications.  Assessment/Plan: 1 CKD Cr ^ mild with better bp. 2 C3 NX 3 HTN improving when takes meds. 4 Anoxic enceph will be limiting factor in care and outcome.  P meds, follow Cr., BP    LOS: 7 days   Ernestene Coover L 05/15/2011,1:03 PM   2

## 2011-05-15 NOTE — Progress Notes (Signed)
Occupational Therapy Session Note  Patient Details  Name: Shraddha Lebron MRN: [id number] Date of Birth: 11/04/1990  Today's Date: 05/15/2011 Time: 1115-1200 Time Calculation (min): 45 min   Skilled Therapeutic Interventions/Progress Updates:    1:1 self care retraining at sink level. Pt displaying confusion about process of washing up and getting dressed and that she was at the hospital. Pt had been incontinent in brief when arrived. Mod Transfer w/c to drop arm commode with decreased forward weight shift demonstrating posterior pushing. Able to perform hygiene and bathing periarea on BSC. Transferred back to w/c. Pt with delayed processing with sequencing in dressing, especially with LB dressing. Pt reports "not like to move [her] legs because they go out of control." Sit to stand with mod A demonstrating decreased trunk, hip and control  Requiring max A to continue to stand.  Therapy Documentation Precautions:  Precautions Precautions: Fall Precaution Comments: extends trunk at times, severe myoclonus today Restrictions Weight Bearing Restrictions: No Pain: No c/o during session  See FIM for current functional status  Therapy/Group: Individual Therapy  [name] 05/15/2011, 3:07 PM

## 2011-05-15 NOTE — Progress Notes (Signed)
Physical Therapy Session Note  Patient Details  Name: Heather Sims MRN: [id number] Date of Birth: 1991-03-08  Today's Date: 05/15/2011 Time: 1400-1430 Time Calculation (min): 30 min  Therapy Documentation Precautions:  Precautions Precautions: Fall Precaution Comments: extends trunk at times, severe myoclonus today Restrictions Weight Bearing Restrictions: No Pain: Pain Assessment Pain Assessment: Faces Faces Pain Scale: Hurts whole lot Pain Type: Neuropathic pain Pain Location: Leg Pain Orientation: Right;Left Pain Descriptors: Sharp Pain Onset: With Activity Pain Intervention(s): Rest   Other Treatments: Treatments Therapeutic Activity: Patient extremely sleepy and lethargic this pm, increased time to awaken to a level where patient could participate.  Performed bed mobility sup <> sit and scooting to EOB with max A and increased time and verbal, visual and tactile cues for sequence; required max A to perform and maintain anterior lean in order to scoot pelvis; attempted lateral scooting EOB with max A to maintain anterior lean and lift buttocks; increased difficulty secondary to clonus and pain in feet during WB.  Sit to supine and repositioning in bed with max A secondary to clonus and patient not wanting to bridge secondary to pain in feet; patient pulled self up in bed in sidelying.    See FIM for current functional status  Therapy/Group: Individual Therapy  [name] [facility] 05/15/2011, 4:48 PM

## 2011-05-15 NOTE — Progress Notes (Signed)
Physical Therapy Session Note  Patient Details  Name: Christianna Belmonte MRN: [id number] Date of Birth: [birth year]  Today's Date: 05/15/2011 Time: 1005-1105 Time Calculation (min): 60 min      Skilled Therapeutic Interventions/Progress Updates: Bed mobility in bed without rail, HOB raised, min A for 1LE.  Sitting on EOB x 30 minutes during drinking chocolate milk and rolling up ACE wrap using bil hands, with VCx, tactile cues for upright posture, feet on floor, continued use of L hand.  Pt assisted with bil foot placement over rail of bedside table, with resulting myoclonic jerking.  Clonus passed after a few seconds.  Attempted standing at risk to rinse and spit at sit, but pt unwilling due to myoclonus.  At w/c level, pt performed oral hygiene safely, with VCs, tactile cues to lean forward fully.  Cognitively, pt remembered therapist's name after 20 minutes, and made a humorous remark .  She sustained attention to task x 1-2 minutes before needing VCs.     Therapy Documentation Precautions:  Precautions Precautions: Fall Precaution Comments: extends trunk at times, severe myoclonus today Restrictions Weight Bearing Restrictions: No    Pain: Pain Assessment Pain Assessment: Faces (intermittent pain during LE myoclonus) Faces Pain Scale: Hurts whole lot Pain Location: Leg (L>R) Pain Intervention(s): Medication (See eMAR)    See FIM for current functional status  Therapy/Group: Individual Therapy  Gavina Dildine 05/15/2011, 2:46 PM

## 2011-05-15 NOTE — Progress Notes (Signed)
Speech Language Pathology Daily Session Note  Patient Details  Name: Heather Sims MRN: [id number] Date of Birth: Apr 13, 1990  Today's Date: 05/15/2011 Time: 0930-1000 Time Calculation (min): 30 min  Short Term Goals: SLP Short Term Goal 1 (Week 1): Pt will follow basic commands with Mod verbal cues SLP Short Term Goal 2 (Week 1): Pt will utilize call bell to express basic wants/needs with Mod A verbal and question cues SLP Short Term Goal 3 (Week 1): Pt will demonstrate functional problem solving for basic and familiar tasks with Mod A verbal cues SLP Short Term Goal 4 (Week 1): Pt will demonstrate sustained attention to tasks for ~5 mins with Mod A verbal cues for redirection SLP Short Term Goal 5 (Week 1): pt will utilzie external aids to increase recall of new, daily information with Mod verbal and semantic cues.  Skilled Therapeutic Interventions: Treatment focus on functional problem solving, sustained attention, and verbal expression. Pt missed first 30 minutes of session due to procedure. Pt more alert and talkative today. Pt also demonstrating humor and increased social interaction. Pt refused breakfast tray but did drink thin liquids via straw without overt s/s of aspiration. Pt ~75% intelligible at the sentence level and needed Mod verbal cues to increase vocal intensity. Functional problem solving for use of schedule and choosing her lunch with a menu with Mod A verbal and visual cues. Sustained attention to tasks for ~ 2 minutes.   Daily Session FIM:  Comprehension Comprehension Mode: Auditory Comprehension: 3-Understands basic 50 - 74% of the time/requires cueing 25 - 50%  of the time Expression Expression Mode: Verbal Expression: 3-Expresses basic 50 - 74% of the time/requires cueing 25 - 50% of the time. Needs to repeat parts of sentences. Social Interaction Social Interaction: 3-Interacts appropriately 50 - 74% of the time - May be physically or verbally  inappropriate. Problem Solving Problem Solving: 2-Solves basic 25 - 49% of the time - needs direction more than half the time to initiate, plan or complete simple activities Memory Memory: 3-Recognizes or recalls 50 - 74% of the time/requires cueing 25 - 49% of the time General  Amount of Missed SLP Time (min): 30 Minutes Pain Pain Assessment Pain Assessment: No/denies pain  Therapy/Group: Individual Therapy  Hazely Sealey 05/15/2011, 11:22 AM

## 2011-05-16 LAB — CBC
Hemoglobin: 9.3 g/dL — ABNORMAL LOW (ref 12.0–15.0)
MCH: 30.8 pg (ref 26.0–34.0)
Platelets: 128 10*3/uL — ABNORMAL LOW (ref 150–400)
RBC: 3.02 MIL/uL — ABNORMAL LOW (ref 3.87–5.11)
WBC: 11.6 10*3/uL — ABNORMAL HIGH (ref 4.0–10.5)

## 2011-05-16 LAB — CULTURE, ROUTINE-ABSCESS

## 2011-05-16 MED ORDER — DOCUSATE SODIUM 50 MG/5ML PO LIQD
100.0000 mg | Freq: Two times a day (BID) | ORAL | Status: DC
  Administered 2011-05-16 – 2011-05-20 (×9): 100 mg via ORAL
  Filled 2011-05-16 (×13): qty 10

## 2011-05-16 MED ORDER — CLONAZEPAM 0.5 MG PO TABS
0.2500 mg | ORAL_TABLET | Freq: Every day | ORAL | Status: DC
  Administered 2011-05-16 – 2011-05-26 (×11): 0.25 mg via ORAL
  Filled 2011-05-16 (×12): qty 1

## 2011-05-16 MED ORDER — PROMETHAZINE HCL 12.5 MG RE SUPP: 12.5000 mg | Freq: Four times a day (QID) | RECTAL | Status: DC | PRN

## 2011-05-16 MED ORDER — PROMETHAZINE HCL 12.5 MG PO TABS: 12.5000 mg | ORAL_TABLET | Freq: Two times a day (BID) | ORAL | Status: DC | PRN

## 2011-05-16 MED ORDER — DIPHENHYDRAMINE HCL 50 MG/ML IJ SOLN: 12.5000 mg | Freq: Two times a day (BID) | INTRAMUSCULAR | Status: DC | PRN

## 2011-05-16 MED ORDER — PROMETHAZINE HCL 25 MG/ML IJ SOLN: 12.5000 mg | Freq: Four times a day (QID) | INTRAMUSCULAR | Status: DC | PRN
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
## 2011-05-16 NOTE — Progress Notes (Signed)
Physical Therapy Session Note  Patient Details  Name: Heather Sims MRN: [id number] Date of Birth: January 09, 1991  Today's Date: 05/16/2011 Time: 4098-1191 Time Calculation (min): 51 min  S:  Pt awaken upon entering the room and talked with therapist, asking appropriate questions, expressing fear of falling with standing and walking.  Mom present and given pt a directed 'pep talk' that she needed to work with therapy and get better so she could take her home.  Skilled Therapeutic Interventions/Progress Updates:   Assisted pt in donning underwear, pants, and socks.  Pt determined to do the socks independently, took her approx. 15 min to donn them.  Transfer rolling to the left and sidelying to sit with mod@.  Stand/squat- pivot transfer to pull up pants and get into wheelchair with max@ with mom pulling up the pants.  Pt then needing to toilet.  Transferred w/c to [facility] with total@+2.  Sitting balance on BSC with close supervision.  Pt very distracted by pain in her feet, frequently throughout session would become shaky/spasm like throughout bilateral LEs.  Slow to perform tasks/process.  Tolerated well and was alert the whole session.   Therapy Documentation Precautions:  Precautions Precautions: Fall Vital Signs: Therapy Vitals Pulse Rate: 79  BP: 152/90 mmHg Patient Position, if appropriate: Sitting Pain: Pain Assessment Pain Assessment:  C/o pain in bilateral feet, not rated but appeared severe.   See FIM for current functional status  Therapy/Group: Individual Therapy  Georges Mouse 05/16/2011, 11:36 AM

## 2011-05-16 NOTE — Progress Notes (Signed)
Physical Therapy Weekly Progress Note  Patient Details  Name: Heather Sims MRN: [id number] Date of Birth: Jan 30, 1991  Today's Date: 05/16/2011  Patient has met 0 of 10 long term goals.  Pt's functional abilities have slightly declined during first week of stay. Pt with increased lethargy, decreased strength and decreased activity tolerance making participation and functional gains in therapy difficult.  Pt requires mod-max A for all mobility and transfers at this time.  Pt unable to tolerate standing or gait training due to myoclonus, c/o L LE pain.  Patient continues to demonstrate the following deficits: decreased strength, decreased activity tolerance, impaired balance, decreased attention and awareness  and therefore will continue to benefit from skilled PT intervention to enhance overall performance with activity tolerance, balance, postural control, ability to compensate for deficits, attention, awareness and coordination.  See Patient's Care Plan for progression toward long term goals.  Patient progressing toward long term goals..  Continue plan of care.   Lundy Cozart 05/16/2011, 9:46 AM

## 2011-05-16 NOTE — Progress Notes (Addendum)
Speech Language Pathology Daily Session Note  Patient Details  Name: Heather Sims MRN: [id number] Date of Birth: 09/22/1990  Today's Date: 05/16/2011 Time: 1033-1100 Time Calculation (min): 27 min  Short Term Goals: Week 1: SLP Short Term Goal 1 (Week 1): Pt will follow basic commands with Mod verbal cues SLP Short Term Goal 2 (Week 1): Pt will utilize call bell to express basic wants/needs with Mod A verbal and question cues SLP Short Term Goal 3 (Week 1): Pt will demonstrate functional problem solving for basic and familiar tasks with Mod A verbal cues SLP Short Term Goal 4 (Week 1): Pt will demonstrate sustained attention to tasks for ~5 mins with Mod A verbal cues for redirection SLP Short Term Goal 5 (Week 1): pt will utilzie external aids to increase recall of new, daily information with Mod verbal and semantic cues.  Skilled Therapeutic Interventions:  Pt. was very lethargic during today's session requiring max verbal stimuli for increased arousal and max-total verbal/tactile stimuli to sustain attention to speaker and task.  Mild-mod verbal cues provided to demonstrate appropriate problem solving and initiation of activities/tasks (opening coke can, tying bandana around head).  Decreased vocal intensity requiring max cues for increased volume and movement of articulators.  Pt. Independently requested results after RN took her BP (initiation & awareness).  Daily Session Precautions/Restrictions    FIM:  Comprehension Comprehension: 5-Follows basic conversation/direction: With extra time/assistive device Expression Expression: 3-Expresses basic 50 - 74% of the time/requires cueing 25 - 50% of the time. Needs to repeat parts of sentences. Social Interaction Social Interaction: 2-Interacts appropriately 25 - 49% of time - Needs frequent redirection. Problem Solving Problem Solving: 2-Solves basic 25 - 49% of the time - needs direction more than half the time to initiate, plan or  complete simple activities Memory Memory: 2-Recognizes or recalls 25 - 49% of the time/requires cueing 51 - 75% of the time FIM - Eating Eating Activity: 5: Set-up assist for open containers General    Pain:   No indications of pain    Therapy/Group: Individual Therapy Breck Coons [place].Ed [employer] [phone]  05/16/2011

## 2011-05-17 MED ORDER — FAMOTIDINE 20 MG PO TABS
20.0000 mg | ORAL_TABLET | Freq: Every day | ORAL | Status: DC
  Administered 2011-05-17 – 2011-05-26 (×9): 20 mg via ORAL
  Filled 2011-05-17 (×12): qty 1

## 2011-05-17 MED ORDER — METOPROLOL TARTRATE 25 MG PO TABS
25.0000 mg | ORAL_TABLET | Freq: Two times a day (BID) | ORAL | Status: DC
  Administered 2011-05-17 – 2011-05-27 (×21): 25 mg via ORAL
  Filled 2011-05-17 (×24): qty 1

## 2011-05-17 MED ORDER — DIVALPROEX SODIUM 125 MG PO CPSP
500.0000 mg | ORAL_CAPSULE | Freq: Four times a day (QID) | ORAL | Status: DC
  Administered 2011-05-17 – 2011-05-25 (×32): 500 mg via ORAL
  Filled 2011-05-17 (×19): qty 4
  Filled 2011-05-17: qty 1
  Filled 2011-05-17 (×17): qty 4
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
## 2011-05-17 NOTE — Progress Notes (Signed)
Subjective: Interval History: has complaints concern of tremor and spasms.  Objective: Vital signs in last 24 hours: Temp:  [97.8 F (36.6 C)-98.6 F (37 C)] 97.8 F (36.6 C) (02/24 0600) Pulse Rate:  [75-92] 85  (02/24 0920) Resp:  [18-20] 18  (02/24 0920) BP: (160-176)/(90-106) 160/90 mmHg (02/24 0920) SpO2:  [96 %-97 %] 97 % (02/24 0600) Weight change:   Intake/Output from previous day:   Intake/Output this shift:    General appearance: cooperative and slowed mentation Resp: diminished breath sounds bilaterally Cardio: impulse: LV lift, regular rate and rhythm, S1, S2 normal and systolic murmur: holosystolic 2/6, blowing at apex GI: pos bs, soft, liver down 4 cm Extremities: edema 2+  Lab Results:  Basename 05/16/11 0620 05/15/11 0635  WBC 11.6* 22.3*  HGB 9.3* 9.9*  HCT 28.0* 30.7*  PLT 128* 160   BMET:  Basename 05/15/11 0500  NA 139  K 4.2  CL 104  CO2 21  GLUCOSE 96  BUN 59*  CREATININE 2.95*  CALCIUM 8.3*   No results found for this basename: PTH:2 in the last 72 hours Iron Studies: No results found for this basename: IRON,TIBC,TRANSFERRIN,FERRITIN in the last 72 hours  Studies/Results: No results found.  I have reviewed the patient's current medications.  Assessment/Plan: 1 CKD expect Cr to rise with better bp control. Check in am 2 HTN improving 3 anemia 4 anoxic enceph per rehab  P bp meds, epo, follow cr    LOS: 9 days   Dontreal Miera L 05/17/2011,11:19 AM

## 2011-05-17 NOTE — Progress Notes (Signed)
Physical Therapy Session Note  Patient Details  Name: Heather Sims MRN: [id number] Date of Birth: Nov 04, 1990  Today's Date: 05/17/2011 Time: 4098-1191 Time Calculation (min): 37 min  Skilled Therapeutic Interventions/Progress Updates:   Assisted pt with donning clothes, max@, total @+2 to stand and pull up pants.  Stand-pivot bed to w/c with total @+1, pt's LEs in severe spasms and painful feet.  Pt needing lots of encouragement and verbal instructions to participate in session.  Fearful of spasms and pain.  Spent several minutes trying to encourage pt to stand in parallel bars, pt leaning posteriorly and resistance to any facilitation forward.  When therapist had given up on pt performing task, pt suddenly grabbed the bars and pulled up with close supervision.  Stating she didn't want therapist to think she would not try.  Unable to then coax pt to try again due to the pain.   Therapy Documentation Precautions:  Precautions Precautions: Fall Precaution Comments: extends trunk at times, severe myoclonus today Restrictions Weight Bearing Restrictions: No Vital Signs: Therapy Vitals Pulse Rate: 85  Resp: 18  BP: 160/90 mmHg Pain: Pain Assessment Pain Assessment: Faces (bilateral foot pain with mvmt and wt bearing.) Pain Score:   8 See FIM for current functional status  Therapy/Group: Individual Therapy  Georges Mouse 05/17/2011, 10:52 AM

## 2011-05-18 LAB — RENAL FUNCTION PANEL
CO2: 26 mEq/L (ref 19–32)
Chloride: 102 mEq/L (ref 96–112)
GFR calc Af Amer: 33 mL/min — ABNORMAL LOW (ref >90)
GFR calc non Af Amer: 28 mL/min — ABNORMAL LOW (ref >90)
Glucose, Bld: 87 mg/dL (ref 70–99)
Sodium: 141 mEq/L (ref 135–145)

## 2011-05-18 MED ORDER — MEGESTROL ACETATE 400 MG/10ML PO SUSP
400.0000 mg | Freq: Two times a day (BID) | ORAL | Status: DC
  Administered 2011-05-18 – 2011-05-27 (×18): 400 mg via ORAL
  Filled 2011-05-18 (×22): qty 10

## 2011-05-18 MED ORDER — ENSURE CLINICAL ST REVIGOR PO LIQD
237.0000 mL | Freq: Two times a day (BID) | ORAL | Status: DC
  Administered 2011-05-18 – 2011-05-19 (×3): 237 mL via ORAL
  Administered 2011-05-20: 09:00:00 via ORAL
  Administered 2011-05-20 – 2011-05-22 (×3): 237 mL via ORAL
  Administered 2011-05-22: 08:00:00 via ORAL
  Administered 2011-05-23: 237 mL via ORAL
  Administered 2011-05-24: 21:00:00 via ORAL
  Administered 2011-05-24 – 2011-05-27 (×5): 237 mL via ORAL

## 2011-05-18 NOTE — Progress Notes (Signed)
Patient ID: Heather Sims, female   DOB: 1990-05-20, 21 y.o.   MRN: [id number]  Patient ID: Heather Sims, female   DOB: January 18, 1991, 21 y.o.   MRN: [id number] Subjective/Complaints 2/25- : ROS Not obtainable from the patient. Objective: Vital Signs: Blood pressure 154/96, pulse 92, temperature 99.1 F (37.3 C), temperature source Oral, resp. rate 15, height 5\' 2"  (1.575 m), weight 52.7 kg (116 lb 2.9 oz), last menstrual period 04/24/2011, SpO2 96.00%. No results found.  Basename 05/16/11 0620  WBC 11.6*  HGB 9.3*  HCT 28.0*  PLT 128*    Basename 05/18/11 0640  NA 141  K 3.6  CL 102  CO2 26  GLUCOSE 87  BUN 49*  CREATININE 2.35*  CALCIUM 8.8   CBG (last 3)  No results found for this basename: GLUCAP:3 in the last 72 hours  Wt Readings from Last 3 Encounters:  05/18/11 52.7 kg (116 lb 2.9 oz)  05/07/11 58 kg (127 lb 13.9 oz)  04/28/11 56.79 kg (125 lb 3.2 oz)    Physical Exam:  W Her pupils do react to light. Neck is supple. Chest is clear to auscultation cardiac exam S1-S2 are regular. Abdominal exam active bowel sounds, soft. Extremities no edema. Remains slow to arouse.  Occasional clonus seen. Exam 2/25  Assessment/Plan: 1. Functional deficits secondary to anoxic BI/ deconditioning which require 3+ hours per day of interdisciplinary therapy in a comprehensive inpatient rehab setting. Physiatrist is providing close team supervision and 24 hour management of active medical problems listed below. Physiatrist and rehab team continue to assess barriers to discharge/monitor patient progress toward functional and medical goals. FIM: FIM - Bathing Bathing Steps Patient Completed: Right Arm;Left Arm;Front perineal area;Buttocks;Left upper leg;Right upper leg;Left lower leg (including foot);Right lower leg (including foot) Bathing: 4: Min-Patient completes 8-9 57f 10 parts or 75+ percent  FIM - Upper Body Dressing/Undressing Upper body dressing/undressing steps patient  completed: Thread/unthread right sleeve of pullover shirt/dresss;Thread/unthread left sleeve of pullover shirt/dress;Put head through opening of pull over shirt/dress;Pull shirt over trunk Upper body dressing/undressing: 3: Mod-Patient completed 50-74% of tasks FIM - Lower Body Dressing/Undressing Lower body dressing/undressing steps patient completed: Thread/unthread right underwear leg;Thread/unthread left underwear leg;Thread/unthread right pants leg;Thread/unthread left pants leg Lower body dressing/undressing: 1: Total-Patient completed less than 25% of tasks  FIM - Toileting Toileting steps completed by patient: Performs perineal hygiene (wasnt wearing any clothing during toileting) Toileting Assistive Devices: Grab bar or rail for support Toileting: 4: Steadying assist  FIM - Diplomatic Services operational officer Devices: Grab bars Toilet Transfers: 3-To toilet/BSC: Mod A (lift or lower assist);3-From toilet/BSC: Mod A (lift or lower assist) Toilet Transfers DO NOT USE: 3-From toilet/BSC: Mod A (lift or lower assist)  FIM - Bed/Chair Transfer Bed/Chair Transfer Assistive Devices: Bed rails Bed/Chair Transfer: 1: Bed > Chair or W/C: Total A (helper does all/Pt. < 25%)  FIM - Locomotion: Wheelchair Distance: 30 Locomotion: Wheelchair: 1: Total Assistance/staff pushes wheelchair (Pt<25%) FIM - Locomotion: Ambulation Locomotion: Ambulation Assistive Devices: Parallel bars Ambulation/Gait Assistance: 1: +1 Total assist Locomotion: Ambulation: 0: Activity did not occur  Comprehension Comprehension Mode: Auditory Comprehension: 5-Follows basic conversation/direction: With extra time/assistive device  Expression Expression Mode: Verbal Expression: 3-Expresses basic 50 - 74% of the time/requires cueing 25 - 50% of the time. Needs to repeat parts of sentences.  Social Interaction Social Interaction: 2-Interacts appropriately 25 - 49% of time - Needs frequent  redirection.  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 DVT Prophylaxis/Anticoagulation: Subcutaneous heparin. Monitor platelet counts and any signs of bleeding  3. seizure disorder. Depakote 1000 mg twice a day. Monitor for any seizure activity  4. Renal failure/MPGN. Mgt per [employer]. Weigh patient daily.   -no significant change in CR Lab Results  Component Value Date   CREATININE 2.35* 05/18/2011    5. Hypertension. Norvasc 10 mg daily, clonidine patch 0.3 mg weekly, Isordil 10 mg 3 times daily, Lopressor twice a day, minoxidil 10 mg daily.  -bp's less labile currently and generally improved BP Readings from Last 3 Encounters:  05/18/11 154/96  05/08/11 134/80  04/28/11 150/88   she is on multiple medications. We'll continue the same. Followed by nephrology.  6. Anemia. Continue iron supplement. Followup CBC  7. Persistent vague foot pain.  Appears to neuropathic.  Also has myoclonus.  Tramadol trial has helped.  Don't think she'll tolerate any more lyrica nor will she be able to purchase it once she goes home. 8. Leukocytosis- WBC down to 22k.  She's afebrile  -LLL pneumonia on CXR--now on zosyn-continue for now.  -ivf to continue until her po intake picks up.  -blood cultures negative. 9.- Excessive sleepiness. Seems to be some better today.  -klonopin adjusted  -consider ritalin trial 10. FEN-add megace  -food from home LOS (Days) 10 A FACE TO FACE EVALUATION WAS PERFORMED  [name] T 05/18/2011, 8:12 AM

## 2011-05-18 NOTE — Progress Notes (Signed)
Physical Therapy Note  Patient Details  Name: Heather Sims MRN: [id number] Date of Birth: 1990-12-03 Today's Date: 05/18/2011  Time: 1345-1447 62 minutes  Pt c/o L LE pain with wt bearing or light touch.  Eased with rest.  Pt lethargic at beginning of session, required increased time to arouse and orient to situation.  Transfer training bed <>w/c and w/c<>toilet with +2 assist for safety, pt = 40%.  Pt limited by LE tremors and pain with movement.  Pt requires max manual facilitaiton to shift wt forward and mod to max visual and verbal cues for hand placement for safety.  Pt able to attend to task in quiet environment x 10 minutes with min cues.  Plant watering at w/c level for activity tolerance and sustained attention.  Pt with good attention to task and able to participate in activity x 10 minutes with only 2-3 rest breaks.  Pt fatigued at end of session, required total assist back to recliner chair.  Individual therapy   Mayes Sangiovanni 05/18/2011, 3:33 PM

## 2011-05-18 NOTE — Progress Notes (Signed)
Speech Language Pathology Weekly Progress Note  Patient Details  Name: Heather Sims MRN: [id number] Date of Birth: 06-Nov-1990  Today's Date: 05/18/2011 Time: 0900-1000 Time Calculation (min): 60 min  Short Term Goals: Week 1: SLP Short Term Goal 1 (Week 1): Pt will follow basic commands with Mod verbal cues SLP Short Term Goal 1 - Progress (Week 1): Met SLP Short Term Goal 2 (Week 1): Pt will utilize call bell to express basic wants/needs with Mod A verbal and question cues SLP Short Term Goal 2 - Progress (Week 1): Not met SLP Short Term Goal 3 (Week 1): Pt will demonstrate functional problem solving for basic and familiar tasks with Mod A verbal cues SLP Short Term Goal 3 - Progress (Week 1): Not met SLP Short Term Goal 4 (Week 1): Pt will demonstrate sustained attention to tasks for ~5 mins with Mod A verbal cues for redirection SLP Short Term Goal 4 - Progress (Week 4): Not met SLP Short Term Goal 5 (Week 1): pt will utilzie external aids to increase recall of new, daily information with Mod verbal and semantic cues. SLP Short Term Goal 5 - Progress (Week 1): Not met  New Short Term Goals:  Week 2: SLP Short Term Goal 1 (Week 2): Pt will utilize increased voice intensity to increse speech intelligilbility at the sentence level with supervision verbal cues. SLP Short Term Goal 2 (Week 2): Pt will demonstrate functional problem solving for basic and familair tasks with Mod A verbal cues SLP Short Term Goal 3 (Week 2): Pt will demonstrate sustained attention to functional tasks for ~5 mins with Mod A verbal cues SLP Short Term Goal 4 (Week 2): Pt will utilize enviornmental and contextual cues to orient to time, place, and situation with Mod A.   Weekly Progress Updates: Pt has met 1 out of 5 STG's this reporting period. Pt's level of arousal continues to fluctuate and pt is overall Max A for sustained attention, functional problem solving, working memory, Animator and awareness. Pt also  continues to demonstrate decreased intelligibility and delayed processing. Pt would benefit from continued skilled SLP services to maximize cognitive functional and overall independence.   Pt's progress limited my medical issues this week. Continue current plan of care.   Daily Session Therapeutic Intervention: Treatment focus on functional problem solving, initiation and sustained attention. Pt lethargic and difficult to arouse. Pt needed Max verbal and tactile cues and extra time to initiate moving legs to sit EOB. Pt with Max A verbal and questioning cues for problem solving/reasoning during functional and familiar tasks. Sustained attention for ~2 minutes with Mod A verbal cues. Pt demonstrating tangential speech and needs Mod A questioning and nonverbal cues for self-monitoring during functional conversation. Pt consumed Dys. 3 textures and thin liquids without overt s/s of aspiration with Min verbal cues for small bites.  FIM:  Comprehension Comprehension Mode: Auditory Comprehension: 4-Understands basic 75 - 89% of the time/requires cueing 10 - 24% of the time Expression Expression Mode: Verbal Expression: 3-Expresses basic 50 - 74% of the time/requires cueing 25 - 50% of the time. Needs to repeat parts of sentences. Social Interaction Social Interaction: 2-Interacts appropriately 25 - 49% of time - Needs frequent redirection. Problem Solving Problem Solving: 1-Solves basic less than 25% of the time - needs direction nearly all the time or does not effectively solve problems and may need a restraint for safety Memory Memory: 2-Recognizes or recalls 25 - 49% of the time/requires cueing 51 - 75% of the  time FIM - Eating Eating Activity: 5: Set-up assist for open containers Pain No/Denies Pain Cognition: Overall Cognitive Status: Impaired Arousal/Alertness: Lethargic Orientation Level: Oriented to person;Oriented to place Attention: Sustained Focused Attention: Appears  intact Sustained Attention: Impaired Sustained Attention Impairment: Verbal basic;Functional basic Memory: Impaired Memory Impairment: Decreased recall of new information;Decreased short term memory Decreased Short Term Memory: Verbal basic;Functional basic Awareness: Impaired Awareness Impairment: Intellectual impairment;Emergent impairment;Anticipatory impairment Problem Solving: Impaired Problem Solving Impairment: Verbal basic;Functional basic Executive Function: Reasoning;Sequencing;Organizing;Decision Making;Initiating;Self Monitoring;Self Correcting Reasoning: Impaired Reasoning Impairment: Functional basic;Verbal basic Sequencing: Impaired Sequencing Impairment: Verbal basic;Functional basic Organizing: Impaired Organizing Impairment: Verbal basic;Functional basic Decision Making: Impaired Decision Making Impairment: Verbal basic;Functional basic Initiating: Impaired Initiating Impairment: Verbal basic;Functional basic Self Monitoring: Impaired Self Monitoring Impairment: Functional basic;Verbal basic Self Correcting: Impaired Self Correcting Impairment: Verbal basic;Functional basic Behaviors: Lability Safety/Judgment: Impaired Oral/Motor: Oral Motor/Sensory Function Overall Oral Motor/Sensory Function: Appears within functional limits for tasks assessed Motor Speech Overall Motor Speech: Impaired Respiration: Within functional limits Phonation: Low vocal intensity Resonance: Within functional limits Articulation: Within functional limitis Intelligibility: Intelligibility reduced Word: 75-100% accurate Phrase: 75-100% accurate Sentence: 50-74% accurate Conversation: 50-74% accurate Motor Planning: Witnin functional limits Motor Speech Errors: Not applicable Effective Techniques: Increased vocal intensity Comprehension: Auditory Comprehension Overall Auditory Comprehension: Impaired Yes/No Questions: Within Functional Limits Commands: Impaired Two Step Basic  Commands: 50-74% accurate Multistep Basic Commands: 50-74% accurate Conversation: Simple Interfering Components: Attention;Anxiety;Pain;Working memory;Processing speed EffectiveTechniques: Extra processing time;Visual/Gestural cues;Repetition IT trainer Discrimination: Within Function Limits Reading Comprehension Reading Status: Within funtional limits (for reading her schedule) Expression: Expression Primary Mode of Expression: Verbal Verbal Expression Overall Verbal Expression: Impaired Initiation: Impaired Automatic Speech: Name;Social Response Level of Generative/Spontaneous Verbalization: Sentence Repetition: No impairment Naming: No impairment Responsive: 76-100% accurate Confrontation: Within functional limits Verbal Errors: Language of confusion;Not aware of errors Pragmatics: Impairment Impairments: Abnormal affect;Topic maintenance Interfering Components: Attention;Speech intelligibility Effective Techniques: Open ended questions;Semantic cues Written Expression Dominant Hand: Right Written Expression: Not tested  Therapy/Group: Individual Therapy  Zuri Lascala 05/18/2011, 4:58 PM

## 2011-05-18 NOTE — Progress Notes (Signed)
INITIAL ADULT NUTRITION ASSESSMENT Date: 05/18/2011   Time: 10:06 AM  Reason for Assessment: Uninentional Wt Loss  ASSESSMENT: Female 21 y.o.  Dx: <principal problem not specified>  Hx:  Past Medical History  Diagnosis Date  . Pneumonia last 2 weeks    'walking pneumonia'  . Hypertension   . Seizures   . Chronic kidney disease   . Anemia    Related Meds:     . amLODipine  10 mg Oral QHS  . calcium acetate  1,334 mg Oral TID WC  . cholecalciferol  400 Units Oral BID  . clonazePAM  0.25 mg Oral QHS  . cloNIDine  0.3 mg Transdermal Weekly  . darbepoetin  100 mcg Subcutaneous Q7 days  . divalproex  500 mg Oral Q6H  . docusate  100 mg Oral BID  . famotidine  20 mg Oral Q supper  . furosemide  160 mg Oral BID  . heparin  5,000 Units Subcutaneous Q8H  . isosorbide dinitrate  20 mg Oral TID  . losartan  100 mg Oral BID  . megestrol  400 mg Oral BID  . metoprolol tartrate  25 mg Oral BID  . minoxidil  5 mg Oral BID  . nystatin  5 mL Oral QID  . piperacillin-tazobactam (ZOSYN)  IV  3.375 g Intravenous Q8H  . polyethylene glycol  17 g Oral Daily  . DISCONTD: fluconazole (DIFLUCAN) IV  100 mg Intravenous Q24H  . DISCONTD: metoprolol  5 mg Intravenous Q6H  . DISCONTD: valproate sodium  500 mg Intravenous Q6H   Ht: 5\' 2"  (157.5 cm)  Wt: 116 lb 2.9 oz (52.7 kg)  Ideal Wt: 50 kg % Ideal Wt: 105%  Wt Readings from Last 10 Encounters:  05/18/11 116 lb 2.9 oz (52.7 kg)  05/07/11 127 lb 13.9 oz (58 kg)  04/28/11 125 lb 3.2 oz (56.79 kg)  04/10/11 153 lb 4.8 oz (69.536 kg)  Usual Wt: 135 lb (61.4 kg) per family prior to hospitalization % Usual Wt: 86%  Body mass index is 21.25 kg/(m^2). Wt is WNL.  Food/Nutrition Related Hx: Regular diet PTA.  Labs:  CMP     Component Value Date/Time   NA 141 05/18/2011 0640   K 3.6 05/18/2011 0640   CL 102 05/18/2011 0640   CO2 26 05/18/2011 0640   GLUCOSE 87 05/18/2011 0640   BUN 49* 05/18/2011 0640   CREATININE 2.35* 05/18/2011 0640    CALCIUM 8.8 05/18/2011 0640   PROT 5.9* 05/14/2011 0557   ALBUMIN 1.8* 05/18/2011 0640   AST 15 05/14/2011 0557   ALT <5 05/14/2011 0557   ALKPHOS 56 05/14/2011 0557   BILITOT 0.2* 05/14/2011 0557   GFRNONAA 28* 05/18/2011 0640   GFRAA 33* 05/18/2011 0640  Phosphorus 6.1H  Intake/Output:      Diet Order: Dysphagia 3 with thin liquids  Supplements/Tube Feeding:  IVF:    Estimated Nutritional Needs:   Kcal:  1650 - 1925 kcal Protein:  55 - 66 grams protein Fluid:  1.6 - 1.9 L/d  Pt transferred to [facility] Feb 5 for further management for renal function. RD drawn to chart 2/2 variable PO intake and unintentional wt loss. Noted pt on megace for appetite.   Pt with 14% wt loss x 1.5 month; meets criteria for moderate malnutrition in the context of acute illness 2/2 this weight loss and <75% of estimated energy requirement for >7 days. Discussed importance of PO intake with patient. She states that she feels as though she  is getting enough to eat at each meal, however RN verifies that pt is eating poorly and attention remains a significant issue. Pt asked this RD why I was asking these questions and discussed her declining weight. Pt states she does like Ensure Clinical Strength, agreeable to adding this supplement to her regimen. Pt states family is providing food for her that helps increase her intake during the evenings.  NUTRITION DIAGNOSIS: -Inadequate oral intake (NI-2.1).  Status: Ongoing  RELATED TO: attention and poor appetite  AS EVIDENCE BY: nursing report  MONITORING/EVALUATION(Goals): Goal: Pt to consume >/= 75% of meals and supplements. Monitor: PO intake, weights, labs, I/O's, renal function  EDUCATION NEEDS: -Education not appropriate at this time  INTERVENTION: 1. Ensure Clinical Strength PO BID 2. RD to follow nutrition care plan  Dietitian #: [phone] [id number]/[id number]  DOCUMENTATION CODES Per approved criteria  -Non-severe (moderate) malnutrition in the context of  acute illness or injury    Adair Laundry 05/18/2011, 10:06 AM

## 2011-05-18 NOTE — Progress Notes (Signed)
Patient ID: Heather Sims, female   DOB: [birth year], 21 y.o.   MRN: [id number] S:lethargic and nonverbal O:BP 154/96  Pulse 92  Temp(Src) 99.1 F (37.3 C) (Oral)  Resp 15  Ht 5\' 2"  (1.575 m)  Wt 52.7 kg (116 lb 2.9 oz)  BMI 21.25 kg/m2  SpO2 96%  LMP 04/24/2011  Intake/Output Summary (Last 24 hours) at 05/18/11 1515 Last data filed at 05/18/11 1400  Gross per 24 hour  Intake    100 ml  Output      0 ml  Net    100 ml   Weight change:  Gen:WD AAF in NAD, nonverbal CVS:RRR Resp:cta WUJ:WJXBJY NWG:NFAOZ edema   Lab 05/18/11 0640 05/15/11 0500 05/14/11 0557 05/13/11 0533 05/12/11 0705  NA 141 139 145 142 141  K 3.6 4.2 4.6 4.0 4.1  CL 102 104 108 108 108  CO2 26 21 21 23 23   GLUCOSE 87 96 80 80 92  BUN 49* 59* 53* 47* 45*  CREATININE 2.35* 2.95* 2.87* 2.71* 2.30*  ALB -- -- -- -- --  CALCIUM 8.8 8.3* 8.6 8.5 8.7  PHOS 6.1* 6.6* 6.0* 4.8* 4.8*  AST -- -- 15 -- 17  ALT -- -- <5 -- <5   Liver Function Tests:  Lab 05/18/11 0640 05/15/11 0500 05/14/11 0557 05/12/11 0705  AST -- -- 15 17  ALT -- -- <5 <5  ALKPHOS -- -- 56 49  BILITOT -- -- 0.2* 0.2*  PROT -- -- 5.9* 5.9*  ALBUMIN 1.8* 1.6* 1.7* --   No results found for this basename: LIPASE:3,AMYLASE:3 in the last 168 hours No results found for this basename: AMMONIA:3 in the last 168 hours CBC:  Lab 05/16/11 0620 05/15/11 0635 05/14/11 0557 05/13/11 0533 05/12/11 0705  WBC 11.6* 22.3* 25.1* -- --  NEUTROABS -- 18.2* 20.2* -- --  HGB 9.3* 9.9* 9.8* -- --  HCT 28.0* 30.7* 30.4* -- --  MCV 92.7 93.9 95.0 95.9 95.8  PLT 128* 160 135* -- --   Cardiac Enzymes: No results found for this basename: CKTOTAL:5,CKMB:5,CKMBINDEX:5,TROPONINI:5 in the last 168 hours CBG:  Lab 05/13/11 1557  GLUCAP 72    Iron Studies: No results found for this basename: IRON,TIBC,TRANSFERRIN,FERRITIN in the last 72 hours Studies/Results: No results found.    Marland Kitchen amLODipine  10 mg Oral QHS  . calcium acetate  1,334 mg Oral TID WC  .  cholecalciferol  400 Units Oral BID  . clonazePAM  0.25 mg Oral QHS  . cloNIDine  0.3 mg Transdermal Weekly  . darbepoetin  100 mcg Subcutaneous Q7 days  . divalproex  500 mg Oral Q6H  . docusate  100 mg Oral BID  . famotidine  20 mg Oral Q supper  . feeding supplement  237 mL Oral BID  . furosemide  160 mg Oral BID  . heparin  5,000 Units Subcutaneous Q8H  . isosorbide dinitrate  20 mg Oral TID  . losartan  100 mg Oral BID  . megestrol  400 mg Oral BID  . metoprolol tartrate  25 mg Oral BID  . minoxidil  5 mg Oral BID  . nystatin  5 mL Oral QID  . piperacillin-tazobactam (ZOSYN)  IV  3.375 g Intravenous Q8H  . polyethylene glycol  17 g Oral Daily    BMET    Component Value Date/Time   NA 141 05/18/2011 0640   K 3.6 05/18/2011 0640   CL 102 05/18/2011 0640   CO2 26 05/18/2011 0640   GLUCOSE  87 05/18/2011 0640   BUN 49* 05/18/2011 0640   CREATININE 2.35* 05/18/2011 0640   CALCIUM 8.8 05/18/2011 0640   GFRNONAA 28* 05/18/2011 0640   GFRAA 33* 05/18/2011 0640   CBC    Component Value Date/Time   WBC 11.6* 05/16/2011 0620   RBC 3.02* 05/16/2011 0620   HGB 9.3* 05/16/2011 0620   HCT 28.0* 05/16/2011 0620   PLT 128* 05/16/2011 0620   MCV 92.7 05/16/2011 0620   MCH 30.8 05/16/2011 0620   MCHC 33.2 05/16/2011 0620   RDW 18.8* 05/16/2011 0620   LYMPHSABS 2.1 05/15/2011 0635   MONOABS 1.9* 05/15/2011 0635   EOSABS 0.1 05/15/2011 0635   BASOSABS 0.1 05/15/2011 0635     Assessment/Plan:  1. 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 Anoxic brain injury- difficult situation unclear if she would be an appropriate outpt dialysis candidate 3. HTN- better 4. Sz- stable 5. dispo-per primary svc  Susi Goslin A

## 2011-05-18 NOTE — Progress Notes (Signed)
Occupational Therapy Weekly Progress Note  Patient Details  Name: Heather Sims MRN: [id number] Date of Birth: [birth year]  Today's Date: 05/18/2011 Time: 1000-1055 Time Calculation (min): 55 min  Patient has met 0 of 4 short term goals.  Pt making slow progress in therapy with functional mobility and functional ADL tasks. Pt has been more resistive to stand, transfer and try ambulation secondary to fear and worsened clonus. Pt family report pt had not been ambulating when she left rehab last time resulting in decreased LE weightbearing resulting in more fear and stronger clonus with LE and decreased trunk/ postural control in functional tasks. Pt demonstrates decreased alertness at times, requires max to total A for following simple commands, sustained attention, memory, problem solving, intellectual awareness, and difficulty staying on topic. Pt requires mod to max A to stand and to maintain standing in ADL task and to transfer. Pt performs ADLs overall mod to A and therefore will continue to benefit from skilled OT intervention to enhance overall performance with BADL.  Patient progressing toward long term goals..  Continue plan of care.  OT Short Term Goals Week 1:  OT Short Term Goal 1 (Week 1): Pt will transfer to the toilet with min A consistantly OT Short Term Goal 1 - Progress (Week 1): Not met OT Short Term Goal 2 (Week 1): Pt would perform sit to stand with mod A for clothing management OT Short Term Goal 2 - Progress (Week 1): Partly met OT Short Term Goal 3 (Week 1): Pt would bathe at shower level with mod A OT Short Term Goal 3 - Progress (Week 1): Not met OT Short Term Goal 4 (Week 1): Pt would transfer into tub  with tub bench with mod A OT Short Term Goal 4 - Progress (Week 1): Not met OT Short Term Goal 5 (Week 1): Pt would demonstrate day to day recall with min A and external aids OT Short Term Goal 5 - Progress (Week 1): Not met Week 2:  OT Short Term Goal 1 (Week 2): Pt  would perform sit to stand for clothing managment with mod A OT Short Term Goal 2 (Week 2): Pt will transfer to [facility] with mod A squat pivot OT Short Term Goal 3 (Week 2): Pt will thread underwear and pants with min A OT Short Term Goal 4 (Week 2): Pt will demonstrated sustained attention for 5 min to complete grooming tasks OT Short Term Goal 5 (Week 2): Pt would maintain sitting balance with supervision at EOB during a functional task  Skilled Therapeutic Interventions/Progress Updates:    1:1 self care retraining sitting in recliner focusing on attention, following one step commands, motor planning with sit to stands and transitional movements, postural control, sequencing, simple problem solving, etc. Sit to stand mod A with decreased postural control, LB dressing with decreased able to sequence and follow through without max cues.  Therapy Documentation Precautions:  Precautions Precautions: Fall Precaution Comments: extends trunk at times, severe myoclonus today Restrictions Weight Bearing Restrictions: No Pain: sensitive feet RN aware    See FIM for current functional status  Therapy/Group: Individual Therapy  [name] 05/18/2011, 4:23 PM

## 2011-05-18 NOTE — Progress Notes (Signed)
Patient Details  Name: Heather Sims MRN: [id number] Date of Birth: 03/08/1991  Today's Date: 05/18/2011 Time: 1400-1430  Skilled Therapeutic Interventions/Progress Updates: Pt c/o L LE pain with wt bearing or light touch. Eased with rest. Pt lethargic at beginning of session, required increased time to arouse and orient to situation. Transferred recliner to w/c and w/c<>toilet with +2 assist for safety, pt = 40%. Pt limited by LE tremors and pain with movement.   Pt participated in horticultural therapy activity watering plants from w/c level working on activity tolerance and sustained attention.  Pt sustained attention 10 minutes with min cues given 2-3 rest breaks during the tasks.  Discussed shape of plant leaves and determining which plants needed water.  Therapy/Group: Other: co-treat with PT  Activity Level: Simple:  Level of assist: Min Assist  Yogesh Cominsky 05/18/2011, 4:03 PM

## 2011-05-19 ENCOUNTER — Inpatient Hospital Stay (HOSPITAL_COMMUNITY): Payer: Medicaid Other

## 2011-05-19 LAB — RENAL FUNCTION PANEL
Albumin: 1.7 g/dL — ABNORMAL LOW (ref 3.5–5.2)
CO2: 26 mEq/L (ref 19–32)
Chloride: 102 mEq/L (ref 96–112)
GFR calc Af Amer: 29 mL/min — ABNORMAL LOW (ref >90)
GFR calc non Af Amer: 25 mL/min — ABNORMAL LOW (ref >90)
Potassium: 3.9 mEq/L (ref 3.5–5.1)
Sodium: 142 mEq/L (ref 135–145)

## 2011-05-19 LAB — CULTURE, BLOOD (ROUTINE X 2): Culture  Setup Time: 201302201346

## 2011-05-19 LAB — CBC
Hemoglobin: 10.2 g/dL — ABNORMAL LOW (ref 12.0–15.0)
MCH: 30.5 pg (ref 26.0–34.0)
Platelets: 67 10*3/uL — ABNORMAL LOW (ref 150–400)
RBC: 3.34 MIL/uL — ABNORMAL LOW (ref 3.87–5.11)
WBC: 8.6 10*3/uL (ref 4.0–10.5)

## 2011-05-19 LAB — GLUCOSE, CAPILLARY: Glucose-Capillary: 99 mg/dL (ref 70–99)

## 2011-05-19 MED ORDER — MOXIFLOXACIN HCL 400 MG PO TABS
400.0000 mg | ORAL_TABLET | Freq: Every day | ORAL | Status: DC
  Administered 2011-05-19 – 2011-05-21 (×3): 400 mg via ORAL
  Filled 2011-05-19 (×4): qty 1

## 2011-05-19 MED ORDER — METHYLPHENIDATE HCL 5 MG PO TABS
5.0000 mg | ORAL_TABLET | Freq: Two times a day (BID) | ORAL | Status: DC
  Administered 2011-05-19 – 2011-05-27 (×17): 5 mg via ORAL
  Filled 2011-05-19 (×17): qty 1

## 2011-05-19 NOTE — Progress Notes (Signed)
Occupational Therapy Session Note  Patient Details  Name: Heather Sims MRN: [id number] Date of Birth: [birth year]  Today's Date: 05/19/2011 Time: 1000-1055 Time Calculation (min): 55 min  Short Term Goals: Week 2:  OT Short Term Goal 1 (Week 2): Pt would perform sit to stand for clothing managment with mod A OT Short Term Goal 2 (Week 2): Pt will transfer to [facility] with mod A squat pivot OT Short Term Goal 3 (Week 2): Pt will thread underwear and pants with min A OT Short Term Goal 4 (Week 2): Pt will demonstrated sustained attention for 5 min to complete grooming tasks OT Short Term Goal 5 (Week 2): Pt would maintain sitting balance with supervision at EOB during a functional task  Skilled Therapeutic Interventions/Progress Updates:    1:1 self care retraining sitting in w/c. Pt difficulty at first to arouse to come to EOB, resisting movement towards sitting up. Came to EOB with mod A and then urgently needed to go to BR, again resistive to transfer to [facility] secondary to feet hurting resulting in an accident with urine on the way to the toilet. Pt delayed transffering for 10 minutes. Mod transfers. Could dress UB with setup, Threading LB clothing with min A with again delayed sit to stand for pulling up pants.  Therapy Documentation Precautions:  Precautions Precautions: Fall Precaution Comments: extends trunk at times, severe myoclonus today Restrictions Weight Bearing Restrictions: No Pain:  sensitive feet   See FIM for current functional status  Therapy/Group: Individual Therapy  [name] 05/19/2011, 2:30 PM

## 2011-05-19 NOTE — Progress Notes (Signed)
Patient ID: Heather Sims, female   DOB: 04/28/1990, 21 y.o.   MRN: [id number] Patient ID: Heather Sims, female   DOB: [birth year], 21 y.o.   MRN: [id number]  Patient ID: Heather Sims, female   DOB: Apr 11, 1990, 21 y.o.   MRN: [id number] Subjective/Complaints 2/26- slept well.  By most accounts a better day yesterday.  Pt difficult to arouse again today. : ROS Not obtainable from the patient. Objective: Vital Signs: Blood pressure 124/77, pulse 84, temperature 98.6 F (37 C), temperature source Oral, resp. rate 19, height 5\' 2"  (1.575 m), weight 53.7 kg (118 lb 6.2 oz), last menstrual period 04/24/2011, SpO2 100.00%. No results found.  Basename 05/19/11 0524  WBC 8.6  HGB 10.2*  HCT 31.9*  PLT 67*    Basename 05/19/11 0524 05/18/11 0640  NA 142 141  K 3.9 3.6  CL 102 102  CO2 26 26  GLUCOSE 95 87  BUN 50* 49*  CREATININE 2.64* 2.35*  CALCIUM 8.9 8.8   CBG (last 3)  No results found for this basename: GLUCAP:3 in the last 72 hours  Wt Readings from Last 3 Encounters:  05/19/11 53.7 kg (118 lb 6.2 oz)  05/07/11 58 kg (127 lb 13.9 oz)  04/28/11 56.79 kg (125 lb 3.2 oz)    Physical Exam:  W Her pupils do react to light. Neck is supple. Chest is clear to auscultation cardiac exam S1-S2 are regular. Abdominal exam active bowel sounds, soft. Extremities no edema. Remains slow to arouse.  Occasional clonus seen in LE's. Moves all 4's Exam 2/26  Assessment/Plan: 1. Functional deficits secondary to anoxic BI/ deconditioning which require 3+ hours per day of interdisciplinary therapy in a comprehensive inpatient rehab setting. Physiatrist is providing close team supervision and 24 hour management of active medical problems listed below. Physiatrist and rehab team continue to assess barriers to discharge/monitor patient progress toward functional and medical goals. FIM: FIM - Bathing Bathing Steps Patient Completed: Chest;Right Arm;Left Arm;Abdomen;Front perineal area;Right upper  leg;Left upper leg;Right lower leg (including foot);Left lower leg (including foot) Bathing: 4: Min-Patient completes 8-9 67f 10 parts or 75+ percent  FIM - Upper Body Dressing/Undressing Upper body dressing/undressing steps patient completed: Thread/unthread right sleeve of pullover shirt/dresss;Thread/unthread left sleeve of pullover shirt/dress;Put head through opening of pull over shirt/dress;Pull shirt over trunk Upper body dressing/undressing: 5: Supervision: Safety issues/verbal cues FIM - Lower Body Dressing/Undressing Lower body dressing/undressing steps patient completed: Don/Doff right sock;Don/Doff left sock Lower body dressing/undressing: 2: Max-Patient completed 25-49% of tasks  FIM - Toileting Toileting steps completed by patient: Performs perineal hygiene (wasnt wearing any clothing during toileting) Toileting Assistive Devices: Grab bar or rail for support Toileting: 4: Steadying assist  FIM - Diplomatic Services operational officer Devices: Grab bars Toilet Transfers: 3-To toilet/BSC: Mod A (lift or lower assist);3-From toilet/BSC: Mod A (lift or lower assist) Toilet Transfers DO NOT USE: 3-From toilet/BSC: Mod A (lift or lower assist)  FIM - Banker Devices: Bed rails Bed/Chair Transfer: 1: Two helpers  FIM - Locomotion: Wheelchair Distance: 30 Locomotion: Wheelchair: 1: Total Assistance/staff pushes wheelchair (Pt<25%) FIM - Locomotion: Ambulation Locomotion: Ambulation Assistive Devices: Parallel bars Ambulation/Gait Assistance: 1: +1 Total assist Locomotion: Ambulation: 0: Activity did not occur  Comprehension Comprehension Mode: Auditory Comprehension: 4-Understands basic 75 - 89% of the time/requires cueing 10 - 24% of the time  Expression Expression Mode: Verbal Expression: 3-Expresses basic 50 - 74% of the time/requires cueing 25 - 50% of the time. Needs to repeat parts  of sentences.  Social  Interaction Social Interaction: 2-Interacts appropriately 25 - 49% of time - Needs frequent redirection.  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 DVT Prophylaxis/Anticoagulation: Subcutaneous heparin. Monitor platelet counts and any signs of bleeding  3. seizure disorder. Depakote 1000 mg twice a day. Monitor for any seizure activity  4. Renal failure/MPGN. Mgt per [employer]. Weigh patient daily.   -no significant change in CR Lab Results  Component Value Date   CREATININE 2.64* 05/19/2011    5. Hypertension. Norvasc 10 mg daily, clonidine patch 0.3 mg weekly, Isordil 10 mg 3 times daily, Lopressor twice a day, minoxidil 10 mg daily.  -bp's less labile currently and generally improved BP Readings from Last 3 Encounters:  05/19/11 124/77  05/08/11 134/80  04/28/11 150/88    6. Anemia. Continue iron supplement. Followup CBC stable 7. Persistent vague foot pain.  Appears to neuropathic.  Also has myoclonus.  Tramadol trial has helped.  Don't think she'll tolerate any more lyrica nor will she be able to purchase it once she goes home. 8. Leukocytosis- WBC down to 22k.  She's afebrile  -LLL pneumonia on CXR--switch to po avelox.  -recheck cxr on Thursday  -ivf to continue until her po intake picks up.  -blood cultures negative. 9.- Excessive sleepiness. Seems to be some better today.  -klonopin adjusted  -add ritalin today 10. FEN-add megace  -food from home  -intake still generally poor. LOS (Days) 11 A FACE TO FACE EVALUATION WAS PERFORMED  Bera Pinela T 05/19/2011, 8:12 AM

## 2011-05-19 NOTE — Plan of Care (Signed)
Problem: RH KNOWLEDGE DEFICIT Goal: RH STG INCREASE KNOWLEDGE OF HYPERTENSION Patient and mother will be able to verbalize understanding of  Hypertension at discharge

## 2011-05-19 NOTE — Progress Notes (Signed)
Physical Therapy Note  Patient Details  Name: Keelia Graybill MRN: [id number] Date of Birth: 14-Mar-1991 Today's Date: 05/19/2011  Time: 1300-1413 73 minutes  Pt c/o pain in L LE with wt bearing and light touch, eased with rest.  Pt more alert this treatment, willing to participate in attempting standing and walking.  Sit to stand in parallel bars with mod A pt pulling up on bars.  Pt able to stand 3 x 60 seconds with min A, manual facilitation for wt shifting and wt bearing on L LE.  Attempted gait multiple times in parallel bars with max A for wt shifts, pt with increased myclonus with attempting gait, unable to step.  Attempt standing at table to perform functional activity, pt unable to stand at table top even with total assist.  Standing in standing frame for cognitive shape and number recognition and drawing activity x 10 minutes.  Pt with attention to task with min-mod cues, requires visual cues to perform shape drawing, number recognition with min cues.  W/c <> recliner transfer with max-total A due to fatigue.  Pt able to attend to eat lunch, drink with max cues for attention to swallowing.   Pt with improved awareness of needing to participate today, more questions about going home and how to get better.  Individual therapy   Adaleah Forget 05/19/2011, 2:14 PM

## 2011-05-19 NOTE — Progress Notes (Signed)
Upon entrance to room at 1445; unable to awaken patient with mid sternal rub to administer medication;  MD Coladonato arrived and was also unable to awaken patient with mid sternal rub.  PA Love notified; Blood Suger  99; portable EEG order by PA Love. Paged PA [place] upon his assessment; notified RN that this was baseline.

## 2011-05-19 NOTE — Progress Notes (Signed)
Speech Language Pathology Daily Session Note  Patient Details  Name: Heather Sims MRN: [id number] Date of Birth: [birth year]  Today's Date: 05/19/2011 Time: 4098-1191 Time Calculation (min): 60 min  Short Term Goals:  SLP Short Term Goal 1 (Week 2): Pt will utilize increased voice intensity to increse speech intelligilbility at the sentence level with supervision verbal cues. SLP Short Term Goal 2 (Week 2): Pt will demonstrate functional problem solving for basic and familair tasks with Mod A verbal cues SLP Short Term Goal 3 (Week 2): Pt will demonstrate sustained attention to functional tasks for ~5 mins with Mod A verbal cues SLP Short Term Goal 4 (Week 2): Pt will utilize enviornmental and contextual cues to orient to time, place, and situation with Mod A.   Skilled Therapeutic Interventions: Treatment focus on functional problem solving, initiation and sustained attention to task. Pt slow to arouse this morning and needed extra time and multiple repititions to follow commands. Max A verbal, visual and questioning cues needed for functional problem solving and utilization of BUE to open containers and for self-feeding. Pt reported, "you're killing me" when asked to perform basic tasks independently. Max A to sustain attention to meal with falling asleep with food in oral cavity but refusing to stop eating and not letting go of food (crushing muffin in her hand).     Daily Session FIM:  Comprehension Comprehension Mode: Auditory Comprehension: 3-Understands basic 50 - 74% of the time/requires cueing 25 - 50%  of the time Expression Expression Mode: Verbal Expression: 3-Expresses basic 50 - 74% of the time/requires cueing 25 - 50% of the time. Needs to repeat parts of sentences. Social Interaction Social Interaction: 2-Interacts appropriately 25 - 49% of time - Needs frequent redirection. Problem Solving Problem Solving: 1-Solves basic less than 25% of the time - needs direction nearly  all the time or does not effectively solve problems and may need a restraint for safety Memory Memory: 1-Recognizes or recalls less than 25% of the time/requires cueing greater than 75% of the time FIM - Eating Eating Activity: 5: Set-up assist for open containers  Pain Pain Assessment Pain Assessment: 0-10 Pain Score: 0-No pain Pain Location: Leg Pain Orientation: Left;Right Pain Descriptors: Sharp Pain Onset: With Activity Patients Stated Pain Goal: 2 Pain Intervention(s): Repositioned;Relaxation 2nd Pain Site Pain Onset: With Activity  Therapy/Group: Individual Therapy  Jedidiah Demartini 05/19/2011, 11:17 AM

## 2011-05-19 NOTE — Progress Notes (Signed)
Patient Details  Name: Heather Sims MRN: [id number] Date of Birth: 01/09/1991  Today's Date: 05/19/2011 Time: 01-1124  Skilled Therapeutic Interventions/Progress Updates: Pt sitting in w/c upon entry to the room.  Was able to arouse pt with moderate verbal and tactile cues.  Pt stating she wanted to go to her apartment and then later stated she wanted to go home with her mom overnight because her brother wanted her home.  Discussed importance of medical care at this point and that we were working on getting her stronger to prepare for discharge.  Discussed potential leisure activities for participation during ELOS.  No c.o pain.   Therapy/Group: Individual Therapy  Activity Level: Simple:  Level of assist: mod cues  Maddux Vanscyoc 05/19/2011, 4:38 PM

## 2011-05-19 NOTE — Progress Notes (Signed)
Patient ID: Heather Sims, female   DOB: May 16, 1990, 21 y.o.   MRN: [id number] S:pt unarousable O:BP 124/77  Pulse 84  Temp(Src) 98.6 F (37 C) (Oral)  Resp 19  Ht 5\' 2"  (1.575 m)  Wt 53.7 kg (118 lb 6.2 oz)  BMI 21.65 kg/m2  SpO2 100%  LMP 04/24/2011  Intake/Output Summary (Last 24 hours) at 05/19/11 1532 Last data filed at 05/19/11 1500  Gross per 24 hour  Intake    290 ml  Output      0 ml  Net    290 ml   Weight change: 1 kg (2 lb 3.3 oz) WUJ:[id number] CVS:RRR Resp:CTA AOZ:HYQMVH Ext:tr edema   Lab 05/19/11 0524 05/18/11 0640 05/15/11 0500 05/14/11 0557 05/13/11 0533  NA 142 141 139 145 142  K 3.9 3.6 4.2 4.6 4.0  CL 102 102 104 108 108  CO2 26 26 21 21 23   GLUCOSE 95 87 96 80 80  BUN 50* 49* 59* 53* 47*  CREATININE 2.64* 2.35* 2.95* 2.87* 2.71*  ALB -- -- -- -- --  CALCIUM 8.9 8.8 8.3* 8.6 8.5  PHOS 5.9* 6.1* 6.6* 6.0* 4.8*  AST -- -- -- 15 --  ALT -- -- -- <5 --   Liver Function Tests:  Lab 05/19/11 0524 05/18/11 0640 05/15/11 0500 05/14/11 0557  AST -- -- -- 15  ALT -- -- -- <5  ALKPHOS -- -- -- 56  BILITOT -- -- -- 0.2*  PROT -- -- -- 5.9*  ALBUMIN 1.7* 1.8* 1.6* --   No results found for this basename: LIPASE:3,AMYLASE:3 in the last 168 hours No results found for this basename: AMMONIA:3 in the last 168 hours CBC:  Lab 05/19/11 0524 05/16/11 0620 05/15/11 0635 05/14/11 0557 05/13/11 0533  WBC 8.6 11.6* 22.3* -- --  NEUTROABS -- -- 18.2* 20.2* --  HGB 10.2* 9.3* 9.9* -- --  HCT 31.9* 28.0* 30.7* -- --  MCV 95.5 92.7 93.9 95.0 95.9  PLT 67* 128* 160 -- --   Cardiac Enzymes: No results found for this basename: CKTOTAL:5,CKMB:5,CKMBINDEX:5,TROPONINI:5 in the last 168 hours CBG:  Lab 05/19/11 1459 05/13/11 1557  GLUCAP 99 72    Iron Studies: No results found for this basename: IRON,TIBC,TRANSFERRIN,FERRITIN in the last 72 hours Studies/Results: No results found.    Marland Kitchen amLODipine  10 mg Oral QHS  . calcium acetate  1,334 mg Oral TID WC    . cholecalciferol  400 Units Oral BID  . clonazePAM  0.25 mg Oral QHS  . cloNIDine  0.3 mg Transdermal Weekly  . darbepoetin  100 mcg Subcutaneous Q7 days  . divalproex  500 mg Oral Q6H  . docusate  100 mg Oral BID  . famotidine  20 mg Oral Q supper  . feeding supplement  237 mL Oral BID  . furosemide  160 mg Oral BID  . heparin  5,000 Units Subcutaneous Q8H  . isosorbide dinitrate  20 mg Oral TID  . losartan  100 mg Oral BID  . megestrol  400 mg Oral BID  . methylphenidate  5 mg Oral BID WC  . metoprolol tartrate  25 mg Oral BID  . minoxidil  5 mg Oral BID  . moxifloxacin  400 mg Oral q1800  . nystatin  5 mL Oral QID  . polyethylene glycol  17 g Oral Daily  . DISCONTD: piperacillin-tazobactam (ZOSYN)  IV  3.375 g Intravenous Q8H    BMET    Component Value Date/Time   NA 142 05/19/2011  0524   K 3.9 05/19/2011 0524   CL 102 05/19/2011 0524   CO2 26 05/19/2011 0524   GLUCOSE 95 05/19/2011 0524   BUN 50* 05/19/2011 0524   CREATININE 2.64* 05/19/2011 0524   CALCIUM 8.9 05/19/2011 0524   GFRNONAA 25* 05/19/2011 0524   GFRAA 29* 05/19/2011 0524   CBC    Component Value Date/Time   WBC 8.6 05/19/2011 0524   RBC 3.34* 05/19/2011 0524   HGB 10.2* 05/19/2011 0524   HCT 31.9* 05/19/2011 0524   PLT 67* 05/19/2011 0524   MCV 95.5 05/19/2011 0524   MCH 30.5 05/19/2011 0524   MCHC 32.0 05/19/2011 0524   RDW 19.2* 05/19/2011 0524   LYMPHSABS 2.1 05/15/2011 0635   MONOABS 1.9* 05/15/2011 0635   EOSABS 0.1 05/15/2011 0635   BASOSABS 0.1 05/15/2011 0635     Assessment/Plan: 1. 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 Anoxic brain injury- difficult situation as she is not an appropriate outpt dialysis candidate.  Recommend palliative care consult to establish goals and limits of care 3. HTN- better 4. Sz- worrisome for break through seizures and post ictal state.  Recommend Neuro evaluation 5. dispo-per primary svc 6. Anemia of chronic disease- on  epo  Reiley Bertagnolli A

## 2011-05-20 LAB — CBC
HCT: 35.3 % — ABNORMAL LOW (ref 36.0–46.0)
Hemoglobin: 10.9 g/dL — ABNORMAL LOW (ref 12.0–15.0)
MCHC: 30.9 g/dL (ref 30.0–36.0)
WBC: 8 10*3/uL (ref 4.0–10.5)

## 2011-05-20 MED ORDER — HEPARIN SODIUM (PORCINE) 1000 UNIT/ML DIALYSIS: 20.0000 [IU]/kg | INTRAMUSCULAR | Status: DC | PRN

## 2011-05-20 NOTE — Progress Notes (Signed)
Physical Therapy Note  Patient Details  Name: Heather Sims MRN: [id number] Date of Birth: 01/03/1991 Today's Date: 05/20/2011  Time: 1300-1355 55 minutes  No c/o pain.  Gait training in parallel bars with mod assist to stand, max A for gait due to myoclonus UEs and LEs with wt bearing and walking.  Pt requires manual facilitation for wt shifting and occasionally for R LE advancement due to anxiety re: wt bearing on L LE.  Pt progressed to gait with RW with +2 assist for safety due to increased muscle tremors/myoclonus.  Pt gait 15', 20' with RW with manual facilitation for wt shifts, assist to advance RW, assist to control trunk due to muscle tremors with all movements.  Pt propelled w/c in controlled environment with min A for steering, min-mod cues for attention to task, hand over hand assist to sequence movement initially, then progressing to verbal/visual cues for sequencing.  Pt with overall improved activity tolerance this session.  Pt with increased anxiety requiring extra time and encouragement to engage in walking and standing activities.  Individual therapy   Jeraldine Primeau 05/20/2011, 3:47 PM

## 2011-05-20 NOTE — Progress Notes (Signed)
Speech Language Pathology Daily Session Note  Patient Details  Name: Heather Sims MRN: [id number] Date of Birth: Aug 13, 1990  Today's Date: 05/20/2011 Time: 0900-1000 Time Calculation (min): 60 min  Short Term Goals:  SLP Short Term Goal 1 (Week 2): Pt will utilize increased voice intensity to increse speech intelligilbility at the sentence level with supervision verbal cues. SLP Short Term Goal 2 (Week 2): Pt will demonstrate functional problem solving for basic and familair tasks with Mod A verbal cues SLP Short Term Goal 3 (Week 2): Pt will demonstrate sustained attention to functional tasks for ~5 mins with Mod A verbal cues SLP Short Term Goal 4 (Week 2): Pt will utilize enviornmental and contextual cues to orient to time, place, and situation with Mod A.   Skilled Therapeutic Interventions: Treatment focus on functional problem solving, sustained attention, and speech intelligibility. Pt awake and alert today. Max A cues needed to utilize increase vocal intensity for speech intelligibility at the sentence level.  Pt ~75% intelligible throughout the session. Mod A verbal and questioning cues for functional problem solving (opening containers, etc.) with self-feeding. Pt requested to go to the gift shop "for a change." Mod A verbal, enviornmental and questioning cues needed to scan and locate specific items and Max A verbal cues to sustain attention task and redirection.    Daily Session FIM:  Comprehension Comprehension Mode: Auditory Comprehension: 3-Understands basic 50 - 74% of the time/requires cueing 25 - 50%  of the time Expression Expression Mode: Verbal Expression: 3-Expresses basic 50 - 74% of the time/requires cueing 25 - 50% of the time. Needs to repeat parts of sentences. Social Interaction Social Interaction: 3-Interacts appropriately 50 - 74% of the time - May be physically or verbally inappropriate. Problem Solving Problem Solving: 2-Solves basic 25 - 49% of the time  - needs direction more than half the time to initiate, plan or complete simple activities Memory Memory: 2-Recognizes or recalls 25 - 49% of the time/requires cueing 51 - 75% of the time FIM - Eating Eating Activity: 5: Set-up assist for open containers  Pain Pain Assessment Pain Assessment: No/denies pain  Therapy/Group: Individual Therapy  Heather Sims 05/20/2011, 4:51 PM

## 2011-05-20 NOTE — Progress Notes (Addendum)
Patient ID: Heather Sims, female   DOB: 03-02-1991, 21 y.o.   MRN: [id number] S:pt awake alert and communicative O:BP 137/76  Pulse 92  Temp(Src) 99 F (37.2 C) (Oral)  Resp 17  Ht 5\' 2"  (1.575 m)  Wt 53.7 kg (118 lb 6.2 oz)  BMI 21.65 kg/m2  SpO2 100%  LMP 04/24/2011  Intake/Output Summary (Last 24 hours) at 05/20/11 1508 Last data filed at 05/20/11 1330  Gross per 24 hour  Intake    480 ml  Output      0 ml  Net    480 ml   Weight change: 0 kg (0 lb) Gen:WD WN AAF in NAD CVS:RRR Resp:CTA JYN:WGNFAO ZHY:QMVHQ edema   Lab 05/19/11 0524 05/18/11 0640 05/15/11 0500 05/14/11 0557  NA 142 141 139 145  K 3.9 3.6 4.2 4.6  CL 102 102 104 108  CO2 26 26 21 21   GLUCOSE 95 87 96 80  BUN 50* 49* 59* 53*  CREATININE 2.64* 2.35* 2.95* 2.87*  ALB -- -- -- --  CALCIUM 8.9 8.8 8.3* 8.6  PHOS 5.9* 6.1* 6.6* 6.0*  AST -- -- -- 15  ALT -- -- -- <5   Liver Function Tests:  Lab 05/19/11 0524 05/18/11 0640 05/15/11 0500 05/14/11 0557  AST -- -- -- 15  ALT -- -- -- <5  ALKPHOS -- -- -- 56  BILITOT -- -- -- 0.2*  PROT -- -- -- 5.9*  ALBUMIN 1.7* 1.8* 1.6* --   No results found for this basename: LIPASE:3,AMYLASE:3 in the last 168 hours No results found for this basename: AMMONIA:3 in the last 168 hours CBC:  Lab 05/20/11 1000 05/19/11 0524 05/16/11 0620 05/15/11 0635 05/14/11 0557  WBC 8.0 8.6 11.6* -- --  NEUTROABS -- -- -- 18.2* 20.2*  HGB 10.9* 10.2* 9.3* -- --  HCT 35.3* 31.9* 28.0* -- --  MCV 95.9 95.5 92.7 93.9 95.0  PLT 59* 67* 128* -- --   Cardiac Enzymes: No results found for this basename: CKTOTAL:5,CKMB:5,CKMBINDEX:5,TROPONINI:5 in the last 168 hours CBG:  Lab 05/19/11 1459 05/13/11 1557  GLUCAP 99 72    Iron Studies: No results found for this basename: IRON,TIBC,TRANSFERRIN,FERRITIN in the last 72 hours Studies/Results: No results found.    Marland Kitchen amLODipine  10 mg Oral QHS  . calcium acetate  1,334 mg Oral TID WC  . cholecalciferol  400 Units Oral BID    . clonazePAM  0.25 mg Oral QHS  . cloNIDine  0.3 mg Transdermal Weekly  . darbepoetin  100 mcg Subcutaneous Q7 days  . divalproex  500 mg Oral Q6H  . docusate  100 mg Oral BID  . famotidine  20 mg Oral Q supper  . feeding supplement  237 mL Oral BID  . furosemide  160 mg Oral BID  . isosorbide dinitrate  20 mg Oral TID  . losartan  100 mg Oral BID  . megestrol  400 mg Oral BID  . methylphenidate  5 mg Oral BID WC  . metoprolol tartrate  25 mg Oral BID  . minoxidil  5 mg Oral BID  . moxifloxacin  400 mg Oral q1800  . nystatin  5 mL Oral QID  . polyethylene glycol  17 g Oral Daily  . DISCONTD: heparin  5,000 Units Subcutaneous Q8H    BMET    Component Value Date/Time   NA 142 05/19/2011 0524   K 3.9 05/19/2011 0524   CL 102 05/19/2011 0524   CO2 26 05/19/2011 0524  GLUCOSE 95 05/19/2011 0524   BUN 50* 05/19/2011 0524   CREATININE 2.64* 05/19/2011 0524   CALCIUM 8.9 05/19/2011 0524   GFRNONAA 25* 05/19/2011 0524   GFRAA 29* 05/19/2011 0524   CBC    Component Value Date/Time   WBC 8.0 05/20/2011 1000   RBC 3.68* 05/20/2011 1000   HGB 10.9* 05/20/2011 1000   HCT 35.3* 05/20/2011 1000   PLT 59* 05/20/2011 1000   MCV 95.9 05/20/2011 1000   MCH 29.6 05/20/2011 1000   MCHC 30.9 05/20/2011 1000   RDW 19.0* 05/20/2011 1000   LYMPHSABS 2.1 05/15/2011 0635   MONOABS 1.9* 05/15/2011 0635   EOSABS 0.1 05/15/2011 0635   BASOSABS 0.1 05/15/2011 0635     Assessment/Plan: 1. 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  Recheck in am.  Cont with ARB, however pt is marginal HD candidate, especially with her behavioral issues 2. Anoxic brain injury- much more cooperative 3. HTN- better 4. Heather Sims- was worrisome for break through seizures and post ictal state, until she heard that they had to cut her hair and miraculously was awake and alert. Recommend NeuroPsych evaluation 5. dispo-per primary svc 6. Anemia of chronic disease- on epo 7. Will need to  continue with outpt f/u with Dr. Caryn Section at our office.  Will call to schedule for f/u in 4-6 weeks.  Will sign off.   If creatinine continues to rise, would stop ARB. 8. F/U with Dr. Caryn Section:  April 17 at 3pm Heather Sims A  HD orders inadvertently written for this patient and were immediately discontinued.  Pt does not require HD.  Disregard orders

## 2011-05-20 NOTE — Progress Notes (Signed)
Subjective/Complaints 2/27- PT WITH DECREASED RESPONSIVENESS YESTERDAY--EEG WAS ORDERED.  WHEN TECH CAME IN TO DO TEST AND TOLD PATIENT HE NEEDED TO CUT HER HAIR TO PERFORM TEST, THE PATIENT IMMEDIATELY BECAME AWAKE AND ALERT. SHE IS QUITE ALERT THIS AM AS WELL.  : ROS Objective: Vital Signs: Blood pressure 137/76, pulse 92, temperature 99 F (37.2 C), temperature source Oral, resp. rate 17, height 5\' 2"  (1.575 m), weight 53.7 kg (118 lb 6.2 oz), last menstrual period 04/24/2011, SpO2 100.00%. No results found.  Basename 05/19/11 0524  WBC 8.6  HGB 10.2*  HCT 31.9*  PLT 67*    Basename 05/19/11 0524 05/18/11 0640  NA 142 141  K 3.9 3.6  CL 102 102  CO2 26 26  GLUCOSE 95 87  BUN 50* 49*  CREATININE 2.64* 2.35*  CALCIUM 8.9 8.8   CBG (last 3)   Basename 05/19/11 1459  GLUCAP 99    Wt Readings from Last 3 Encounters:  05/20/11 53.7 kg (118 lb 6.2 oz)  05/07/11 58 kg (127 lb 13.9 oz)  04/28/11 56.79 kg (125 lb 3.2 oz)    Physical Exam:  W Her pupils do react to light. Neck is supple. Chest is clear to auscultation cardiac exam S1-S2 are regular. Abdominal exam active bowel sounds, soft. Extremities no edema. Very bright and alert this am.  Occasional clonus seen in LE's. Moves all 4's Exam 2/27  Assessment/Plan: 1. Functional deficits secondary to anoxic BI/ deconditioning which require 3+ hours per day of interdisciplinary therapy in a comprehensive inpatient rehab setting. Physiatrist is providing close team supervision and 24 hour management of active medical problems listed below. Physiatrist and rehab team continue to assess barriers to discharge/monitor patient progress toward functional and medical goals. FIM: FIM - Bathing Bathing Steps Patient Completed: Chest;Right Arm;Left Arm;Abdomen;Front perineal area;Left upper leg;Right upper leg Bathing: 3: Mod-Patient completes 5-7 66f 10 parts or 50-74%  FIM - Upper Body Dressing/Undressing Upper body  dressing/undressing steps patient completed: Thread/unthread right sleeve of pullover shirt/dresss;Thread/unthread left sleeve of pullover shirt/dress;Put head through opening of pull over shirt/dress;Pull shirt over trunk Upper body dressing/undressing: 5: Set-up assist to: Obtain clothing/put away FIM - Lower Body Dressing/Undressing Lower body dressing/undressing steps patient completed: Thread/unthread right underwear leg;Thread/unthread left underwear leg;Thread/unthread right pants leg;Thread/unthread left pants leg;Don/Doff left sock;Don/Doff right sock Lower body dressing/undressing: 3: Mod-Patient completed 50-74% of tasks  FIM - Toileting Toileting steps completed by patient: Performs perineal hygiene Toileting Assistive Devices: Grab bar or rail for support Toileting: 2: Max-Patient completed 1 of 3 steps  FIM - Diplomatic Services operational officer Devices: Psychiatrist Transfers: 2-From toilet/BSC: Max A (lift and lower assist) Toilet Transfers DO NOT USE: 3-From toilet/BSC: Mod A (lift or lower assist)  FIM - Banker Devices: Bed rails Bed/Chair Transfer: 2: Chair or W/C > Bed: Max A (lift and lower assist)  FIM - Locomotion: Wheelchair Distance: 30 Locomotion: Wheelchair: 1: Total Assistance/staff pushes wheelchair (Pt<25%) FIM - Locomotion: Ambulation Locomotion: Ambulation Assistive Devices: Parallel bars Ambulation/Gait Assistance: 1: +1 Total assist Locomotion: Ambulation: 0: Activity did not occur  Comprehension Comprehension Mode: Auditory Comprehension: 3-Understands basic 50 - 74% of the time/requires cueing 25 - 50%  of the time  Expression Expression Mode: Verbal Expression: 3-Expresses basic 50 - 74% of the time/requires cueing 25 - 50% of the time. Needs to repeat parts of sentences.  Social Interaction Social Interaction: 2-Interacts appropriately 25 - 49% of time - Needs frequent  redirection.  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 DVT Prophylaxis/Anticoagulation: Subcutaneous heparin. Monitor platelet counts and any signs of bleeding   -will d/c sq heparin d/t thrombocytopenia  -recheck platelets today.  3. Seizure disorder. Depakote 1000 mg twice a day. Some concern about whether she was seizing or "post-ictal".  I think yesterday's events reveal that lethargy is largely behavioral.  4. Renal failure/MPGN. Mgt per [employer]. Weigh patient daily.   -no significant change in CR Lab Results  Component Value Date   CREATININE 2.64* 05/19/2011    5. Hypertension. Norvasc 10 mg daily, clonidine patch 0.3 mg weekly, Isordil 10 mg 3 times daily, Lopressor twice a day, minoxidil 10 mg daily.  -bp's less labile currently and generally improved BP Readings from Last 3 Encounters:  05/20/11 137/76  05/08/11 134/80  04/28/11 150/88    6. Anemia. Continue iron supplement. Followup CBC stable 7. Persistent vague foot pain.  Appears to neuropathic.  Also has myoclonus.  Tramadol trial had helped, but we stopped due "tongue swelling" and fatigue  -i suspect a large part of this is behavioral as well. 8. Leukocytosis- WBC down to 22k.  She's afebrile  -LLL pneumonia on CXR--switch to po avelox.  -recheck cxr on Thursday  -ivf to continue until her po intake picks up.  -blood cultures negative. 9.- Excessive sleepiness.- THIS IS LARGELY BEHAVIORAL.  DISCUSSED AT LENGTH WITH PATIENT AND MOTHER (WHO AGREES)  -discussed motivation and working through her problems in order to get home.  -klonopin adjusted down.  -add ritalin today 10. FEN-added  megace  -food from home  -intake still generally poor. LOS (Days) 12 A FACE TO FACE EVALUATION WAS PERFORMED  [place] Spainhower T 05/20/2011, 7:40 AM

## 2011-05-20 NOTE — Progress Notes (Signed)
Occupational Therapy Session Note  Patient Details  Name: Heather Sims MRN: [id number] Date of Birth: 06/20/1990  Today's Date: 05/20/2011 Time: 0830-0900 Time Calculation (min): 30 min  Short Term Goals: Week 2:  OT Short Term Goal 1 (Week 2): Pt would perform sit to stand for clothing managment with mod A OT Short Term Goal 2 (Week 2): Pt will transfer to [facility] with mod A squat pivot OT Short Term Goal 3 (Week 2): Pt will thread underwear and pants with min A OT Short Term Goal 4 (Week 2): Pt will demonstrated sustained attention for 5 min to complete grooming tasks OT Short Term Goal 5 (Week 2): Pt would maintain sitting balance with supervision at EOB during a functional task  Skilled Therapeutic Interventions/Progress Updates:    1:1 self care retraining with focus on bed mobility, getting to EOB, sit to stand to pull up pants with mod A, transfer into w/c with mod A, grooming at sink (washing face and brushing teeth) with setup. Pt needed direct max cuing to complete familiar task with max A for simple problem solving.  1:1 2nd session: self care retraining to focus on bathing at shower level in ADL apartment. Min to Mod A transfers w/c<>toilet<tub bench to get into tub. Pt with significant jerky movements in feet with movement in LE which patient reports creates discomfort. Pt remained seated with lateral leans to wash buttocks and peri area. Dressed in shower using grab bar for sit to stand, performed sit to stand with steadying A for clothing management. Pt needs bilateral UE support for standing balance. Pt requested to try to use RW to demonstrate functional ambulation, performed with Total A +2 for about 5 steps with increased jerky movements in feet however was able to advance them with the encouragement of the therapy dog! :)  Therapy Documentation Precautions:  Precautions Precautions: Fall Precaution Comments: extends trunk at times, severe myoclonus  today Restrictions Weight Bearing Restrictions: No Pain:  bilateral feet sensitivity - RN aware  See FIM for current functional status  Therapy/Group: Individual Therapy  [name] 05/20/2011, 3:30 PM

## 2011-05-20 NOTE — Patient Care Conference (Signed)
Inpatient RehabilitationTeam Conference Note Date: 05/19/2011   Time: 2:58 PM    Patient Name: Heather Sims      Medical Record Number: [id number]  Date of Birth: [birth year] Sex: Female         Room/Bed: 4029/4029-01 Payor Info: Payor:     Admitting Diagnosis: ANOXIC BI  Admit Date/Time:  05/08/2011  4:36 PM Admission Comments: No comment available   Primary Diagnosis:  <principal problem not specified> Principal Problem: <principal problem not specified>  Patient Active Problem List  Diagnoses Date Noted  . Anoxic brain damage 05/12/2011  . Renal failure (ARF), acute on chronic 05/06/2011  . Seizures 05/06/2011  . Anoxic brain injury 04/14/2011  . Hypertension 04/07/2011  . ? Viral Cardiomyopathy 04/07/2011  . AKI (acute kidney injury) 03/26/2011  . Acute heart failure 03/26/2011  . Hypoxemia 03/26/2011  . Pneumonia 03/26/2011  . Anemia 03/26/2011    Expected Discharge Date: Expected Discharge Date: 05/27/11  Team Members Present: Physician: Dr. Faith Rogue Case Manager Present: Melanee Spry, RN Social Worker Present: Amada Jupiter, LCSW Nurse Present: Carmie End, RN PT Present: Reggy Eye, PT OT Present: Roney Mans, OT;Ardis Rowan, COTA SLP Present: Feliberto Gottron, SLP Other (Discipline and Name): Tora Duck, PPS Coordinator     Current Status/Progress Goal Weekly Team Focus  Medical   Pneumonia resolving with improved white count.  Increased activation and dietary intake      Bowel/Bladder   Continent of bowel, incontinent of urine at times  Continent of bowel and bladder      Swallow/Nutrition/ Hydration   Dys. 3 with thin, Max A for self-feeding at times due to arousal   Min A  utilization of swallowing compensatory strategies    ADL's   mod to max A  supervision to min A  activity tolerance, LE weight bearing, postural control   Mobility   total A  min-mod A  activity tolerance   Communication   Mod A  Min A  expression of wants/needs, speech  intelligilibility, comprehension    Safety/Cognition/ Behavioral Observations  Max A  Min A  problem solving, attention, awareness    Pain   Denies  </=3      Skin   Intact  Remain intact         *See Interdisciplinary Assessment and Plan and progress notes for long and short-term goals  Barriers to Discharge: Cognitive deficits and fatigue    Possible Resolutions to Barriers:  See above    Discharge Planning/Teaching Needs:  home with mother to provide 24/7 care - education ongoing      Team Discussion: Pt reluctant to put weight on her feet--anticipates pain.  This has limited txfrs/ambulation.  Therapists were able to get her to work on standing in parallel bars.  Ritalin & megace added.   Revisions to Treatment Plan: none    Continued Need for Acute Rehabilitation Level of Care: The patient requires daily medical management by a physician with specialized training in physical medicine and rehabilitation for the following conditions: Daily direction of a multidisciplinary physical rehabilitation program to ensure safe treatment while eliciting the highest outcome that is of practical value to the patient.: Yes Daily medical management of patient stability for increased activity during participation in an intensive rehabilitation regime.: Yes Daily analysis of laboratory values and/or radiology reports with any subsequent need for medication adjustment of medical intervention for : Neurological problems;Cardiac problems;Other  Brock Ra 05/20/2011, 5:58 PM

## 2011-05-20 NOTE — Progress Notes (Signed)
Patient Details  Name: Heather Sims MRN: [id number] Date of Birth: [birth year]  Today's Date: 05/20/2011 Time:  Skilled Therapeutic Interventions/Progress Updates: out and about to gift shop per pt request. Pt required mod verbal, enviornmental and questioning cues to scan and locate specific items and Max verbal cues to sustain attention to task and for redirection.   No c/o pain.   Therapy/Group: Other: co-treat with ST  Activity Level: Simple:  Level of assist: Max Assist  Heather Sims 05/20/2011, 5:11 PM

## 2011-05-21 ENCOUNTER — Inpatient Hospital Stay (HOSPITAL_COMMUNITY): Payer: Medicaid Other

## 2011-05-21 LAB — RENAL FUNCTION PANEL
BUN: 50 mg/dL — ABNORMAL HIGH (ref 6–23)
Calcium: 9.5 mg/dL (ref 8.4–10.5)
Glucose, Bld: 92 mg/dL (ref 70–99)
Phosphorus: 5.1 mg/dL — ABNORMAL HIGH (ref 2.3–4.6)

## 2011-05-21 LAB — CBC
HCT: 34.2 % — ABNORMAL LOW (ref 36.0–46.0)
MCHC: 32.5 g/dL (ref 30.0–36.0)
MCV: 94.7 fL (ref 78.0–100.0)
RDW: 19.3 % — ABNORMAL HIGH (ref 11.5–15.5)
WBC: 8.4 10*3/uL (ref 4.0–10.5)

## 2011-05-21 MED ORDER — DOCUSATE SODIUM 100 MG PO CAPS
100.0000 mg | ORAL_CAPSULE | Freq: Two times a day (BID) | ORAL | Status: DC
  Administered 2011-05-21 – 2011-05-27 (×13): 100 mg via ORAL
  Filled 2011-05-21 (×15): qty 1

## 2011-05-21 NOTE — Progress Notes (Signed)
Speech Language Pathology Daily Session Note  Patient Details  Name: Heather Sims MRN: [id number] Date of Birth: April 23, 1990  Today's Date: 05/21/2011 Time: 0900-1000 Time Calculation (min): 60 min  Short Term Goals: Week 2: SLP Short Term Goal 1 (Week 2): Pt will utilize increased voice intensity to increase speech intelligibility at the sentence level with supervision verbal cues. SLP Short Term Goal 2 (Week 2): Pt will demonstrate functional problem solving for basic and familair tasks with Mod A verbal cues SLP Short Term Goal 3 (Week 2): Pt will demonstrate sustained attention to functional tasks for ~5 mins with Mod A verbal cues SLP Short Term Goal 4 (Week 2): Pt will utilize environmental and contextual cues to orient to time, place, and situation with Mod A.   Skilled Therapeutic Interventions: Treatment focused on functional orientation with this SLP (first time treating) with total assist semantic cues for reason for hospitalization.  Use of calendar for functional knowledge of time information with initial max assist, however patient initiated and attempted to problem solve date/DOW with a 5 minute delay and moderate semantic/visual cues.  Sustained attention in social conversational turn-taking for 4 minutes with 2 prompts for redirection.  Attempted to log-in to her social networking page however patient inconsistently typed 3 different e-mail addresses and required max verbal cues to verbalize aloud so SLP could assist.  Cued patient to use 1 digit to type as multiple fingers were hitting keys and creating further errors that she was not consistently aware of or able to correct (max assist).  Daily Session Precautions/Restrictions  Restrictions Weight Bearing Restrictions: No FIM:  Comprehension Comprehension Mode: Auditory Comprehension: 3-Understands basic 50 - 74% of the time/requires cueing 25 - 50%  of the time Expression Expression Mode: Verbal Expression: 3-Expresses  basic 50 - 74% of the time/requires cueing 25 - 50% of the time. Needs to repeat parts of sentences. Social Interaction Social Interaction: 3-Interacts appropriately 50 - 74% of the time - May be physically or verbally inappropriate. Problem Solving Problem Solving: 3-Solves basic 50 - 74% of the time/requires cueing 25 - 49% of the time Memory Memory: 2-Recognizes or recalls 25 - 49% of the time/requires cueing 51 - 75% of the time General    Pain Pain Assessment Pain Assessment: No/denies pain Pain Score: 0-No pain  Therapy/Group: Individual Therapy  Myra Rude, M.S.,CCC-SLP Pager [phone] 05/21/2011, 12:24 PM

## 2011-05-21 NOTE — Progress Notes (Signed)
Physical Therapy Note  Patient Details  Name: Heather Sims MRN: [id number] Date of Birth: [birth year] Today's Date: 05/21/2011  Time: 1000-1057 57 minutes  No c/o pain.  Pt required max encouragement to participate in functional mobility tasks today.  Pt with decreased awareness of situation and need to increase strength to improve.  Attempted to have pt stand to play table top game, pt agreeable to stand 2 x 3 minutes but unwilling to play game while standing.  Pt demonstrates procrastinating behavior to get out of participating in therapy.  Pt with difficulty making decisions, talks around ideas in order to delay performing activities.  Pt participating in w/c mobility with max encouragement, min assist for steering.  Pt's behavior limited progress during this session.  Individual therapy   Lyden Redner 05/21/2011, 12:48 PM

## 2011-05-21 NOTE — Progress Notes (Signed)
Patient ID: Heather Sims, female   DOB: March 15, 1991, 21 y.o.   MRN: [id number] Mother updated after Team Conf:  ELOS remains 05/27/11.  Pt's progress has been limited.  Mother needs to begin hands on education soon.

## 2011-05-21 NOTE — Progress Notes (Addendum)
Occupational Therapy Session Note  Patient Details  Name: Heather Sims MRN: [id number] Date of Birth: 23-Dec-1990  Today's Date: 05/21/2011 Time: 8:30-9:00  Time Calculation (min): 30 min  Short Term Goals: Week 2:  OT Short Term Goal 1 (Week 2): Pt would perform sit to stand for clothing managment with mod A OT Short Term Goal 2 (Week 2): Pt will transfer to [facility] with mod A squat pivot OT Short Term Goal 3 (Week 2): Pt will thread underwear and pants with min A OT Short Term Goal 4 (Week 2): Pt will demonstrated sustained attention for 5 min to complete grooming tasks OT Short Term Goal 5 (Week 2): Pt would maintain sitting balance with supervision at EOB during a functional task  Skilled Therapeutic Interventions/Progress Updates:    1:1 1st session: focus on transfer from recliner to w/c with mod A with reaching for arm rest in the direction she is going, grooming at the sink with setup. Demonstrating decreased problem solving and awareness with brushing teeth, pt first spit in sink and then wanted to spit in paper towels, then pt said "oh was I suppose to spit in the sink."   1:1 2nd session 11:00-12:00 ( ) : self care retraining at shower level down in ADL apartment. Functional ambulation with RW from door of bathroom to toilet with total A +2, with increased LE clonus and decreased trunk control. Transferred from toilet>w/c<> tub bench for shower transfer. Bathed in sitting with lateral leans for peri hygiene. Pt used grab bar in shower for sit to stands with min A, pt resists anterior weight shifts pushing backward resulting in feet coming up from underneath her. Pt dressed with extra time with minA however pt continued to procrastinates through out session as well as requires max to total A with simple problem solving.   Therapy Documentation Precautions:  Precautions Precautions: Fall Precaution Comments: extends trunk at times, severe myoclonus today Restrictions Weight  Bearing Restrictions: No Pain: Pain Assessment Pain Assessment: No/denies pain Pain Score: 0-No pain  See FIM for current functional status  Therapy/Group: Individual Therapy  [name] 05/21/2011, 12:36 PM

## 2011-05-21 NOTE — Progress Notes (Signed)
Patient Details  Name: Sherrina Zaugg MRN: [id number] Date of Birth: 10-30-1990  Today's Date: 05/21/2011 Time:12-1028: [id number]  Skilled Therapeutic Interventions/Progress Updates: Pt requested a cooking activity or to play a game.  Attempted cooking activity in kitchen in which pt consistently declined participation once presented with cooking options.  Then attempted game per pt request in the dayroom standing EOT.  Pt required Max verbal cues and mod-max assist for standing.  Pt questioning why she needed to stand.  Dicussed importance of standing and it's carry-over in to walking as well as to progress her to a level where she could function safely at home with her mom.   Pt stood x2, once for ~2 minutes and then ~3 minutes. Pt tends to use emotions as a manipulative tool in therapy sessions   often time procrastinating tasks that she initiated.  Ambulated with RW Total A +2 for toileting with Max verbal cues/encouragement. No c/o pain.  Therapy/Group: Other: 1: co-treat with PT       2:co-treat with OT   Finas Delone 05/21/2011, 1:08 PM

## 2011-05-21 NOTE — Progress Notes (Signed)
Patient ID: Heather Sims, female   DOB: January 30, 1991, 21 y.o.   MRN: [id number] Subjective/Complaints 2/28- more alert. "i walked yesterday". Ate more : ROS Objective: Vital Signs: Blood pressure 137/87, pulse 88, temperature 98.7 F (37.1 C), temperature source Oral, resp. rate 18, height 5\' 2"  (1.575 m), weight 43.2 kg (95 lb 3.8 oz), last menstrual period 04/24/2011, SpO2 99.00%. No results found.  Basename 05/20/11 1000 05/19/11 0524  WBC 8.0 8.6  HGB 10.9* 10.2*  HCT 35.3* 31.9*  PLT 59* 67*    Basename 05/19/11 0524  NA 142  K 3.9  CL 102  CO2 26  GLUCOSE 95  BUN 50*  CREATININE 2.64*  CALCIUM 8.9   CBG (last 3)   Basename 05/19/11 1459  GLUCAP 99    Wt Readings from Last 3 Encounters:  05/21/11 43.2 kg (95 lb 3.8 oz)  05/07/11 58 kg (127 lb 13.9 oz)  04/28/11 56.79 kg (125 lb 3.2 oz)    Physical Exam:  W Her pupils do react to light. Neck is supple. Chest is clear to auscultation cardiac exam S1-S2 are regular. Abdominal exam active bowel sounds, soft. Extremities no edema. Very bright and alert this am. More communicative and aware. Occasional clonus seen in LE's. Moves all 4's fairly equally.  Still has some discomfort at ankles/feet. Exam 2/28.  Assessment/Plan: 1. Functional deficits secondary to anoxic BI/ deconditioning which require 3+ hours per day of interdisciplinary therapy in a comprehensive inpatient rehab setting. Physiatrist is providing close team supervision and 24 hour management of active medical problems listed below. Physiatrist and rehab team continue to assess barriers to discharge/monitor patient progress toward functional and medical goals. FIM: FIM - Bathing Bathing Steps Patient Completed: Chest;Right Arm;Left Arm;Abdomen;Front perineal area;Buttocks;Right lower leg (including foot);Left upper leg;Right upper leg;Left lower leg (including foot) Bathing: 4: Steadying assist  FIM - Upper Body Dressing/Undressing Upper body  dressing/undressing steps patient completed: Thread/unthread right sleeve of pullover shirt/dresss;Thread/unthread left sleeve of pullover shirt/dress;Put head through opening of pull over shirt/dress;Pull shirt over trunk Upper body dressing/undressing: 5: Set-up assist to: Obtain clothing/put away FIM - Lower Body Dressing/Undressing Lower body dressing/undressing steps patient completed: Thread/unthread right underwear leg;Thread/unthread left underwear leg;Thread/unthread right pants leg;Thread/unthread left pants leg;Don/Doff left sock;Don/Doff right sock Lower body dressing/undressing: 4: Min-Patient completed 75 plus % of tasks  FIM - Toileting Toileting steps completed by patient: Performs perineal hygiene Toileting Assistive Devices: Grab bar or rail for support Toileting: 1: Two helpers  FIM - Diplomatic Services operational officer Devices: Psychiatrist Transfers: 1-Two helpers Event organiser DO NOT USE: 3-From toilet/BSC: Mod A (lift or lower assist)  FIM - Banker Devices: Bed rails Bed/Chair Transfer: 3: Bed > Chair or W/C: Mod A (lift or lower assist);3: Chair or W/C > Bed: Mod A (lift or lower assist)  FIM - Locomotion: Wheelchair Distance: 30 Locomotion: Wheelchair: 2: Travels 50 - 149 ft with minimal assistance (Pt.>75%) FIM - Locomotion: Ambulation Locomotion: Ambulation Assistive Devices: Parallel bars Ambulation/Gait Assistance: 1: +1 Total assist Locomotion: Ambulation: 1: Two helpers  Comprehension Comprehension Mode: Auditory Comprehension: 3-Understands basic 50 - 74% of the time/requires cueing 25 - 50%  of the time  Expression Expression Mode: Verbal Expression: 3-Expresses basic 50 - 74% of the time/requires cueing 25 - 50% of the time. Needs to repeat parts of sentences.  Social Interaction Social Interaction: 3-Interacts appropriately 50 - 74% of the time - May be physically or verbally  inappropriate.  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 DVT Prophylaxis/Anticoagulation: Subcutaneous heparin. Monitor platelet counts and any signs of bleeding   -will d/c sq heparin d/t thrombocytopenia  -recheck platelets today.  3. Seizure disorder. Depakote 1000 mg twice a day. No seizure activity.  4. Renal failure/MPGN. Mgt per [employer]. Weigh patient daily.   Lab Results  Component Value Date   CREATININE 2.64* 05/19/2011    5. Hypertension. Norvasc 10 mg daily, clonidine patch 0.3 mg weekly, Isordil 10 mg 3 times daily, Lopressor twice a day, minoxidil 10 mg daily.  -bp's less labile currently and generally improved BP Readings from Last 3 Encounters:  05/21/11 137/87  05/08/11 134/80  04/28/11 150/88    6. Anemia. Continue iron supplement. Followup CBC stable 7. Persistent vague foot pain.  Appears to neuropathic.  Also has myoclonus.  Tramadol trial had helped, but we stopped due "tongue swelling" and fatigue  -i suspect a large part of this is behavioral as well. 8. Leukocytosis- WBC down to 22k.  She's afebrile  -LLL pneumonia on CXR-- po avelox.  -recheck cxr today   -ivf to continue until her po intake picks up.  -blood cultures negative. 9.- Excessive sleepiness.- THIS IS LARGELY BEHAVIORAL.  DISCUSSED AT LENGTH WITH PATIENT AND MOTHER (WHO AGREES)  -discussed motivation and working through her problems in order to get home.  -klonopin adjusted down.  -added ritalin which has really helped  -don't see value in a neuropsych consult as she doesn't have the cognitive awareness to really participate in this. Motivation seems to be improved over the last 2-3 days.  10.  FEN-added megace  -food from home  -ate 75 -100% yesterday. 11. Thrombocytopenia-  -sq heparin stopped  -cbc pending today. LOS (Days) 13 A FACE TO FACE EVALUATION WAS PERFORMED  Heather Sims T 05/21/2011, 8:02 AM

## 2011-05-22 DIAGNOSIS — Z5189 Encounter for other specified aftercare: Secondary | ICD-10-CM

## 2011-05-22 DIAGNOSIS — J159 Unspecified bacterial pneumonia: Secondary | ICD-10-CM

## 2011-05-22 DIAGNOSIS — R5381 Other malaise: Secondary | ICD-10-CM

## 2011-05-22 DIAGNOSIS — N179 Acute kidney failure, unspecified: Secondary | ICD-10-CM

## 2011-05-22 DIAGNOSIS — G931 Anoxic brain damage, not elsewhere classified: Secondary | ICD-10-CM

## 2011-05-22 LAB — CBC
Hemoglobin: 10.8 g/dL — ABNORMAL LOW (ref 12.0–15.0)
MCH: 31 pg (ref 26.0–34.0)
MCHC: 32.4 g/dL (ref 30.0–36.0)
MCV: 95.7 fL (ref 78.0–100.0)

## 2011-05-22 NOTE — Progress Notes (Signed)
Occupational Therapy Note  Patient Details  Name: [name] MRN: [id number] Date of Birth: [birth year] Today's Date: 05/22/2011 1300-1330  (30 min)  No pain: Individual therapy  Engaged in toilet transfer and toileting.  Pt. Was mod assist plus manual facilitation and verbal cues for stabililty through the pelvis region.  Pt. Had better control when cued to do isometric holding.     Humberto Seals 05/22/2011, 4:44 PM

## 2011-05-22 NOTE — Progress Notes (Signed)
Occupational Therapy Session Note  Patient Details  Name: Ashlynn Gunnels MRN: [id number] Date of Birth: [birth year]  Today's Date: 05/22/2011 Time: 0830-0930 Time Calculation (min): 60 min  Short Term Goals: Week 2:  OT Short Term Goal 1 (Week 2): Pt would perform sit to stand for clothing managment with mod A OT Short Term Goal 2 (Week 2): Pt will transfer to [facility] with mod A squat pivot OT Short Term Goal 3 (Week 2): Pt will thread underwear and pants with min A OT Short Term Goal 4 (Week 2): Pt will demonstrated sustained attention for 5 min to complete grooming tasks OT Short Term Goal 5 (Week 2): Pt would maintain sitting balance with supervision at EOB during a functional task  Skilled Therapeutic Interventions/Progress Updates:    1:1 self care retraining at sink level. Pt continues to delay therapy, procrastinating getting out of bed. Today it took 25 min for pt to transfer from bed to w/c finding reasons to delay her ADL performance. Pt with max to total A to recall method for transfer (to reach over to arm rest in the direction she is going for an increased anterior weight shift). Focused on sit to stand and activity tolerance, toilet transfer, toileting as pt allowed today.  LTG goals were downgraded to min A overall except for max A for toileting.  Therapy Documentation Precautions:  Precautions Precautions: Fall Precaution Comments: extends trunk at times, severe myoclonus today Restrictions Weight Bearing Restrictions: No Pain:  NO c/o pain other than sensitivity in feet  See FIM for current functional status  Therapy/Group: Individual Therapy  [name] 05/22/2011, 10:38 AM

## 2011-05-22 NOTE — Progress Notes (Signed)
Physical Therapy Weekly Progress Note  Patient Details  Name: Destaney Sarkis MRN: [id number] Date of Birth: 1990/10/28  Today's Date: 05/22/2011  Patient has met 0 of 8 long term goals. Pt has been progressing with activity tolerance and tolerance to standing and gait training.  Pt limited by behavioral issues decreasing participation in functional therapeutic activity.  Pt also limited by increasing myoclonus in all extremities and trunk making gait and transfers difficult.  Goals have been downgraded to mod A, family to come in next week to perform family ed.  Patient continues to demonstrate the following deficits: decreased strength, decreased balance, impaired gait, impaired attention, impaired awareness, decreased activity tolerance and therefore will continue to benefit from skilled PT intervention to enhance overall performance with activity tolerance, balance, postural control, ability to compensate for deficits, attention, awareness and coordination.  See Patient's Care Plan for progression toward long term goals.  Patient not progressing toward long term goals.  See goal revision..  Continue plan of care.   Rushi Chasen 05/22/2011, 3:27 PM

## 2011-05-22 NOTE — Progress Notes (Signed)
Physical Therapy Note  Patient Details  Name: Heather Sims MRN: [id number] Date of Birth: 04/26/1990 Today's Date: 05/22/2011  Time: 1000-1100 60 minutes  No c/o pain.  Pt given no choices at beginning of session, told she had to participate in walking therapy today. Pt attempted to procrastinate but was agreeable to participate when PT gave her goal of doing 4 walks and then ending session.   Gait training with RW with max A due to increased myoclonus this morning. Pt required 20-30 seconds of static standing with max A before myoclonus reduced enough to be safe for ambulation.  Pt gait 3 x 20', 1x 10' with max A, close follow with w/c.  After 3rd walk pt with increased fatigue, required 3 x sit to stand before myoclonus reduced enough for pt to ambulate safety.  Myoclonus increases with fatigue.  Bathroom transfers and toileting with max A with grab bar, +2 for safety due to fatigue.  Pt continues to require max encouragement to participate and demonstrates procrastinating behavior to avoid therapy tasks.  Individual therapy   Jathen Sudano 05/22/2011, 1:54 PM

## 2011-05-22 NOTE — Progress Notes (Addendum)
Patient ID: Heather Sims, female   DOB: 06-08-1990, 21 y.o.   MRN: [id number] Patient ID: Heather Sims, female   DOB: 1990-07-16, 21 y.o.   MRN: [id number] Subjective/Complaints 3/1 : ROS--no new complaints.  Seemed to have a good day yesterday Objective: Vital Signs: Blood pressure 126/79, pulse 87, temperature 98.6 F (37 C), temperature source Oral, resp. rate 18, height 5\' 2"  (1.575 m), weight 53.2 kg (117 lb 4.6 oz), last menstrual period 04/24/2011, SpO2 98.00%. Dg Chest 2 View  05/21/2011  *RADIOLOGY REPORT*  Clinical Data: Shortness of breath, weakness, pneumonia, history hypertension, seizures, chronic kidney disease  CHEST - 2 VIEW  Comparison: 05/15/2011  Findings: Enlargement of cardiac silhouette. Mediastinal contours and pulmonary vascularity normal. Improving left lower lobe consolidation. Remaining lungs clear. No pleural effusion or pneumothorax. Bones unremarkable.  IMPRESSION: Improving left lower lobe consolidation.  Original Report Authenticated By: Lollie Marrow, M.D.    Basename 05/21/11 1057 05/20/11 1000  WBC 8.4 8.0  HGB 11.1* 10.9*  HCT 34.2* 35.3*  PLT 54* 59*    Basename 05/21/11 1102  NA 140  K 3.5  CL 100  CO2 27  GLUCOSE 92  BUN 50*  CREATININE 2.51*  CALCIUM 9.5   CBG (last 3)   Basename 05/19/11 1459  GLUCAP 99    Wt Readings from Last 3 Encounters:  05/22/11 53.2 kg (117 lb 4.6 oz)  05/07/11 58 kg (127 lb 13.9 oz)  04/28/11 56.79 kg (125 lb 3.2 oz)    Physical Exam:  W Her pupils do react to light. Neck is supple. Chest is clear to auscultation cardiac exam S1-S2 are regular. Abdominal exam active bowel sounds, soft. Extremities no edema. Very bright and alert this am. More communicative and aware. Occasional clonus seen in LE's. Moves all 4's fairly equally.  Still has some discomfort at ankles/feet. Exam 3/1  Assessment/Plan: 1. Functional deficits secondary to anoxic BI/ deconditioning which require 3+ hours per day of  interdisciplinary therapy in a comprehensive inpatient rehab setting. Physiatrist is providing close team supervision and 24 hour management of active medical problems listed below. Physiatrist and rehab team continue to assess barriers to discharge/monitor patient progress toward functional and medical goals. FIM: FIM - Bathing Bathing Steps Patient Completed: Chest;Right Arm;Left Arm;Abdomen;Front perineal area;Buttocks;Left upper leg;Right upper leg;Right lower leg (including foot);Left lower leg (including foot) Bathing: 4: Steadying assist  FIM - Upper Body Dressing/Undressing Upper body dressing/undressing steps patient completed: Thread/unthread right sleeve of pullover shirt/dresss;Thread/unthread left sleeve of pullover shirt/dress;Put head through opening of pull over shirt/dress;Pull shirt over trunk Upper body dressing/undressing: 5: Set-up assist to: Obtain clothing/put away FIM - Lower Body Dressing/Undressing Lower body dressing/undressing steps patient completed: Thread/unthread right underwear leg;Thread/unthread left underwear leg;Thread/unthread right pants leg;Thread/unthread left pants leg;Don/Doff right shoe Lower body dressing/undressing: 4: Min-Patient completed 75 plus % of tasks  FIM - Toileting Toileting steps completed by patient: Performs perineal hygiene Toileting Assistive Devices: Grab bar or rail for support Toileting: 2: Max-Patient completed 1 of 3 steps  FIM - Diplomatic Services operational officer Devices: Psychiatrist Transfers: 3-To toilet/BSC: Mod A (lift or lower assist);3-From toilet/BSC: Mod A (lift or lower assist) Toilet Transfers DO NOT USE: 3-From toilet/BSC: Mod A (lift or lower assist)  FIM - Banker Devices: Bed rails Bed/Chair Transfer: 3: Chair or W/C > Bed: Mod A (lift or lower assist);3: Bed > Chair or W/C: Mod A (lift or lower assist)  FIM - Locomotion: Wheelchair Distance:  30 Locomotion: Wheelchair: 2: Travels 50 - 149 ft with minimal assistance (Pt.>75%) FIM - Locomotion: Ambulation Locomotion: Ambulation Assistive Devices: Parallel bars Ambulation/Gait Assistance: 1: +1 Total assist Locomotion: Ambulation: 0: Activity did not occur  Comprehension Comprehension Mode: Auditory Comprehension: 2-Understands basic 25 - 49% of the time/requires cueing 51 - 75% of the time  Expression Expression Mode: Verbal Expression: 2-Expresses basic 25 - 49% of the time/requires cueing 50 - 75% of the time. Uses single words/gestures.  Social Interaction Social Interaction: 2-Interacts appropriately 25 - 49% of time - Needs frequent redirection.  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 DVT Prophylaxis/Anticoagulation: Subcutaneous heparin. Monitor platelet counts and any signs of bleeding   -will d/c sq heparin d/t thrombocytopenia  -recheck platelets today.  3. Seizure disorder. Depakote 1000 mg twice a day. No seizure activity.  4. Renal failure/MPGN. Mgt per [employer]. Weigh patient daily.   Lab Results  Component Value Date   CREATININE 2.51* 05/21/2011    5. Hypertension. Norvasc 10 mg daily, clonidine patch 0.3 mg weekly, Isordil 10 mg 3 times daily, Lopressor twice a day, minoxidil 10 mg daily.  -bp's less labile currently and generally improved BP Readings from Last 3 Encounters:  05/22/11 126/79  05/08/11 134/80  04/28/11 150/88    6. Anemia. Continue iron supplement. Followup CBC stable 7. Persistent vague foot pain.  Appears to neuropathic.  Also has myoclonus.  Tramadol trial had helped, but we stopped due "tongue  swelling" and fatigue  -i suspect a large part of this is behavioral as well. 8. Leukocytosis- WBC down to 22k.  She's afebrile  -LLL pneumonia on CXR-- po avelox-will stop per below  -recheck cxr improved  -blood cultures negative. 9.- Excessive sleepiness.- THIS IS LARGELY BEHAVIORAL.  DISCUSSED AT LENGTH WITH PATIENT AND MOTHER (WHO AGREES)  -discussed motivation and working through her problems in order to get home.  -klonopin adjusted down.  -added ritalin which has really helped  -don't see value in a neuropsych consult as she doesn't have the cognitive awareness to really participate in this. Motivation seems to be improved over the last few days.  10. FEN-added megace  -food from home  -eating better as a whole  -off ivf, see how she maintains on her own 20. Thrombocytopenia-  -sq heparin stopped. Will stop avelox today  -valproic acid can cause thrombocytopenia, but she is at high risk for seizure. Will discuss with pharmacy.  Could consider change to tegretol but this can affect platelets as well.  -platelets have stabilized today.  -will recheck cbc in am and also check a vpa level as this has been trending up lately. Will hold off on adjusting the vpa dose until we see the level in the am. LOS (Days) 14 A FACE TO FACE EVALUATION WAS PERFORMED  Gleen Ripberger T 05/22/2011, 7:16 AM

## 2011-05-22 NOTE — Progress Notes (Signed)
Speech Language Pathology Daily Session Note  Patient Details  Name: Heather Sims MRN: [id number] Date of Birth: September 01, 1990  Today's Date: 05/22/2011 Time: 1100-1155 Time Calculation (min): 55 min  Short Term Goals:  SLP Short Term Goal 1 (Week 2): Pt will utilize increased voice intensity to increse speech intelligilbility at the sentence level with supervision verbal cues. SLP Short Term Goal 2 (Week 2): Pt will demonstrate functional problem solving for basic and familair tasks with Mod A verbal cues SLP Short Term Goal 3 (Week 2): Pt will demonstrate sustained attention to functional tasks for ~5 mins with Mod A verbal cues SLP Short Term Goal 4 (Week 2): Pt will utilize enviornmental and contextual cues to orient to time, place, and situation with Mod A.   Skilled Therapeutic Interventions: Treatment focus on initiation, sustained attention and functional problem solving. Pt asleep in wheelchair and needed Max-Total A verbal and tactile cues for arousal and sustained attention greater than 15 seconds that faded to Mod A verbal and visual cues by end of session. Max A verbal cues to utilize increased vocal intensity to increase speech intelligibility at the sentence level. Pt ~80% intelligible throughout session. Participated in a game of "uno" and needed increased processing time to complete tasks (dealing out cards). Mod A verbal and questioning cues to sustain attention to game and for problem solving for rules of game. (matching colors and numbers).    Daily Session FIM:  Comprehension Comprehension Mode: Auditory Comprehension: 2-Understands basic 25 - 49% of the time/requires cueing 51 - 75% of the time Expression Expression Mode: Verbal Expression: 2-Expresses basic 25 - 49% of the time/requires cueing 50 - 75% of the time. Uses single words/gestures. Social Interaction Social Interaction: 2-Interacts appropriately 25 - 49% of time - Needs frequent redirection. Problem  Solving Problem Solving: 1-Solves basic less than 25% of the time - needs direction nearly all the time or does not effectively solve problems and may need a restraint for safety Memory Memory: 1-Recognizes or recalls less than 25% of the time/requires cueing greater than 75% of the time  Pain Pain Assessment Pain Assessment: No/denies pain  Therapy/Group: Individual Therapy  Thaila Bottoms 05/22/2011, 12:16 PM

## 2011-05-22 NOTE — Progress Notes (Signed)
MEDICATION RELATED CONSULT NOTE - INITIAL   Pharmacy Consult for thrombocytopenia with divalproex Indication: seizures  No Known Allergies  Patient Measurements: Height: 5\' 2"  (157.5 cm) Weight: 117 lb 4.6 oz (53.2 kg) IBW/kg (Calculated) : 50.1    Vital Signs: Temp: 98.6 F (37 C) (03/01 0500) Temp src: Oral (03/01 0500) BP: 126/79 mmHg (03/01 0500) Pulse Rate: 87  (03/01 0500) Intake/Output from previous day: 02/28 0701 - 03/01 0700 In: 960 [P.O.:960] Out: 1 [Urine:1] Intake/Output from this shift:    Labs:  Basename 05/21/11 1102 05/21/11 1057 05/20/11 1000  WBC -- 8.4 8.0  HGB -- 11.1* 10.9*  HCT -- 34.2* 35.3*  PLT -- 54* 59*  APTT -- -- --  CREATININE 2.51* -- --  LABCREA -- -- --  CREATININE 2.51* -- --  CREAT24HRUR -- -- --  MG -- -- --  PHOS 5.1* -- --  ALBUMIN 2.0* -- --  PROT -- -- --  ALBUMIN 2.0* -- --  AST -- -- --  ALT -- -- --  ALKPHOS -- -- --  BILITOT -- -- --  BILIDIR -- -- --  IBILI -- -- --   Estimated Creatinine Clearance: 28 ml/min (by C-G formula based on Cr of 2.51).   Microbiology: Recent Results (from the past 720 hour(s))  URINE CULTURE     Status: Normal   Collection Time   04/26/11  8:28 PM      Component Value Range Status Comment   Specimen Description URINE, CLEAN CATCH   Final    Special Requests NONE   Final    Culture  Setup Time [id number]   Final    Colony Count 40,000 COLONIES/ML   Final    Culture     Final    Value: Multiple bacterial morphotypes present, none predominant. Suggest appropriate recollection if clinically indicated.   Report Status 04/28/2011 FINAL   Final   URINE CULTURE     Status: Normal   Collection Time   05/12/11  4:09 PM      Component Value Range Status Comment   Specimen Description URINE, CLEAN CATCH   Final    Special Requests NONE   Final    Culture  Setup Time [id number]   Final    Colony Count NO GROWTH   Final    Culture NO GROWTH   Final    Report Status 05/13/2011 FINAL    Final   CULTURE, BLOOD (ROUTINE X 2)     Status: Normal   Collection Time   05/13/11  9:07 AM      Component Value Range Status Comment   Specimen Description BLOOD HAND RIGHT   Final    Special Requests BOTTLES DRAWN AEROBIC AND ANAEROBIC 10.[facility] EACH   Final    Culture  Setup Time [id number]   Final    Culture NO GROWTH 5 DAYS   Final    Report Status 05/19/2011 FINAL   Final   CULTURE, BLOOD (ROUTINE X 2)     Status: Normal   Collection Time   05/13/11  9:18 AM      Component Value Range Status Comment   Specimen Description BLOOD HAND LEFT   Final    Special Requests     Final    Value: BOTTLES DRAWN AEROBIC AND ANAEROBIC 10.0 CC BLUE 3.0CC RED   Culture  Setup Time [id number]   Final    Culture NO GROWTH 5 DAYS   Final    Report Status 05/19/2011  FINAL   Final   CULTURE, ROUTINE-ABSCESS     Status: Normal   Collection Time   05/14/11  8:48 AM      Component Value Range Status Comment   Specimen Description ABSCESS MOUTH   Final    Special Requests SWAB TOP/BOTTOM OF TONGUE   Final    Gram Stain     Final    Value: NO WBC SEEN     ABUNDANT SQUAMOUS EPITHELIAL CELLS PRESENT     FEW GRAM POSITIVE COCCI     IN PAIRS RARE GRAM POSITIVE RODS     RARE GRAM NEGATIVE RODS   Culture     Final    Value: MODERATE STAPHYLOCOCCUS AUREUS     Note: RIFAMPIN AND GENTAMICIN SHOULD NOT BE USED AS SINGLE DRUGS FOR TREATMENT OF STAPH INFECTIONS. This organism is presumed to be Clindamycin resistant based on detection of inducible Clindamycin resistance.   Report Status 05/16/2011 FINAL   Final    Organism ID, Bacteria STAPHYLOCOCCUS AUREUS   Final     Medical History: Past Medical History  Diagnosis Date  . Pneumonia last 2 weeks    'walking pneumonia'  . Hypertension   . Seizures   . Chronic kidney disease   . Anemia     Medications:  Scheduled:    . amLODipine  10 mg Oral QHS  . calcium acetate  1,334 mg Oral TID WC  . cholecalciferol  400 Units Oral BID  . clonazePAM  0.25  mg Oral QHS  . cloNIDine  0.3 mg Transdermal Weekly  . darbepoetin  100 mcg Subcutaneous Q7 days  . divalproex  500 mg Oral Q6H  . docusate sodium  100 mg Oral BID  . famotidine  20 mg Oral Q supper  . feeding supplement  237 mL Oral BID  . furosemide  160 mg Oral BID  . isosorbide dinitrate  20 mg Oral TID  . losartan  100 mg Oral BID  . megestrol  400 mg Oral BID  . methylphenidate  5 mg Oral BID WC  . metoprolol tartrate  25 mg Oral BID  . minoxidil  5 mg Oral BID  . nystatin  5 mL Oral QID  . polyethylene glycol  17 g Oral Daily  . DISCONTD: docusate  100 mg Oral BID  . DISCONTD: moxifloxacin  400 mg Oral q1800    Assessment: 21 yo lady admitted Jan 3 s/p cardiac arrest.  She developed seizures after being rewarmed from hypothermia protocol.  She was started on valproate 1000 mg q12 hours and is currently on depakote 500 mg q6 hours.  Her baseline PTLC was ~300K.  PLTC today 65.  Thrombocytopenia associated with valproic acid appears to be dose dependent and ranges from 1-24% occurrence.  She is currently receiving ~37 mg/kg/day.    Goal of Therapy:  Valproic acid trough 50-100 mcg/ml  Plan:  Consider checking valproic acid level if continued therapy is desired.    Talbert Cage Poteet 05/22/2011,9:29 AM

## 2011-05-23 LAB — CBC
HCT: 32.5 % — ABNORMAL LOW (ref 36.0–46.0)
MCH: 30.9 pg (ref 26.0–34.0)
MCHC: 32.3 g/dL (ref 30.0–36.0)
RDW: 19.1 % — ABNORMAL HIGH (ref 11.5–15.5)

## 2011-05-23 LAB — VALPROIC ACID LEVEL: Valproic Acid Lvl: 93.9 ug/mL (ref 50.0–100.0)

## 2011-05-23 NOTE — Progress Notes (Signed)
Physical Therapy Note  Patient Details  Name: Kajol Crispen MRN: [id number] Date of Birth: 04/17/1990 Today's Date: 05/23/2011  1300-1355 (55 minutes) individual Pain- no complaint of pain Focus of treatment: wc mobility training; standing tolerance using appropriate assistive device to decrease clonus in weight bearing Treatment: wc mobility - (using bilateral UEs) mod assist 120  Feet level surfaces with tactile for steering around obstacles; sit to stand to EVA walker mod assist; standing to EVA walker X 3 for approximately 2-4 minutes per trial with moderate bilateral LE myoclonus.   Almee Pelphrey,JIM 05/23/2011, 1:25 PM

## 2011-05-23 NOTE — Progress Notes (Signed)
Speech Language Pathology Daily Session Note  Patient Details  Name: Heather Sims MRN: [id number] Date of Birth: 1990/04/13  Today's Date: 05/23/2011 Time: 1400-1430 Time Calculation (min): 30 min  Short Term Goals: Week 2: SLP Short Term Goal 1 (Week 2): Pt will utilize increased voice intensity to increse speech intelligilbility at the sentence level with supervision verbal cues. SLP Short Term Goal 2 (Week 2): Pt will demonstrate functional problem solving for basic and familair tasks with Mod A verbal cues SLP Short Term Goal 3 (Week 2): Pt will demonstrate sustained attention to functional tasks for ~5 mins with Mod A verbal cues SLP Short Term Goal 4 (Week 2): Pt will utilize enviornmental and contextual cues to orient to time, place, and situation with Mod A.   Skilled Therapeutic Interventions: Treatment focused on functional sustained attention and working memory during a structured turn taking card game Juanna Cao).  Patient did not require review of the rules and initiated her turn, and proceeded to follow the game rules of matching either color, number or using a wild for approximately 1 minute at a time and max semantic cues every 1-2 minutes to only place 1 card down at a time.  By 20 minutes, patient became distracted and irritable, losing attention and verbalizing that she had not been following color/number rule and wanted to stop game.  Difficult to redirect her or engage in verbal reasoning as she remained seated with her eyes closed yet verbalizing "take me back to my room."  Behavioral issues impact her overall ability to progress.  Daily Session Precautions/Restrictions    FIM:  Comprehension Comprehension Mode: Auditory Comprehension: 2-Understands basic 25 - 49% of the time/requires cueing 51 - 75% of the time Expression Expression Mode: Verbal Expression: 2-Expresses basic 25 - 49% of the time/requires cueing 50 - 75% of the time. Uses single words/gestures. Social  Interaction Social Interaction: 2-Interacts appropriately 25 - 49% of time - Needs frequent redirection. Problem Solving Problem Solving: 2-Solves basic 25 - 49% of the time - needs direction more than half the time to initiate, plan or complete simple activities Memory Memory: 1-Recognizes or recalls less than 25% of the time/requires cueing greater than 75% of the time General    Pain Pain Assessment Pain Assessment: No/denies pain  Therapy/Group: Individual Therapy  Darrol Poke 05/23/2011, 2:49 PM

## 2011-05-23 NOTE — Progress Notes (Signed)
BLE's with non-pitting edema, especially to bilateral feet and ankles. Will speak with on call Doctor in AM.[place], Alfredo Martinez

## 2011-05-23 NOTE — Progress Notes (Signed)
Patient ID: Heather Sims, female   DOB: [birth year], 21 y.o.   MRN: [id number] Subjective/Complaints 3/2- she has no complaints except that she has noted some swelling of her feet over the past several days. : ROS--no new complaints.  Seemed to have a good day yesterday Objective: Vital Signs: Blood pressure 120/75, pulse 91, temperature 98.4 F (36.9 C), temperature source Oral, resp. rate 16, height 5\' 2"  (1.575 m), weight 115 lb 15.4 oz (52.6 kg), last menstrual period 04/24/2011, SpO2 98.00%. No results found.  Basename 05/23/11 0600 05/22/11 0854  WBC 7.9 7.8  HGB 10.5* 10.8*  HCT 32.5* 33.3*  PLT 81* 65*    Basename 05/21/11 1102  NA 140  K 3.5  CL 100  CO2 27  GLUCOSE 92  BUN 50*  CREATININE 2.51*  CALCIUM 9.5   CBG (last 3)  No results found for this basename: GLUCAP:3 in the last 72 hours  Wt Readings from Last 3 Encounters:  05/23/11 115 lb 15.4 oz (52.6 kg)  05/07/11 127 lb 13.9 oz (58 kg)  04/28/11 125 lb 3.2 oz (56.79 kg)    Physical Exam:  W Her pupils do react to light. Neck is supple. Chest is clear to auscultation cardiac exam S1-S2 are regular. Abdominal exam active bowel sounds, soft. Extremities 1+ edema of feet. Very bright and alert this am. More communicative and aware. Occasional clonus seen in LE's. Moves all 4's fairly equally.  Still has some discomfort at ankles/feet. Exam 3/2  Assessment/Plan: 1. Functional deficits secondary to anoxic BI/ deconditioning which require 3+ hours per day of interdisciplinary therapy in a comprehensive inpatient rehab setting. Physiatrist is providing close team supervision and 24 hour management of active medical problems listed below. Physiatrist and rehab team continue to assess barriers to discharge/monitor patient progress toward functional and medical goals. FIM: FIM - Bathing Bathing Steps Patient Completed: Chest;Right Arm;Left Arm;Abdomen;Front perineal area;Buttocks;Left upper leg;Right upper leg;Right  lower leg (including foot);Left lower leg (including foot) Bathing: 4: Steadying assist  FIM - Upper Body Dressing/Undressing Upper body dressing/undressing steps patient completed: Thread/unthread right sleeve of pullover shirt/dresss;Thread/unthread left sleeve of pullover shirt/dress;Put head through opening of pull over shirt/dress;Pull shirt over trunk Upper body dressing/undressing: 5: Set-up assist to: Obtain clothing/put away FIM - Lower Body Dressing/Undressing Lower body dressing/undressing steps patient completed: Thread/unthread right underwear leg;Thread/unthread left underwear leg;Thread/unthread right pants leg;Thread/unthread left pants leg;Don/Doff right shoe Lower body dressing/undressing: 4: Min-Patient completed 75 plus % of tasks  FIM - Toileting Toileting steps completed by patient: Performs perineal hygiene Toileting Assistive Devices: Grab bar or rail for support Toileting: 2: Max-Patient completed 1 of 3 steps  FIM - Diplomatic Services operational officer Devices: Psychiatrist Transfers: 3-To toilet/BSC: Mod A (lift or lower assist);3-From toilet/BSC: Mod A (lift or lower assist) Toilet Transfers DO NOT USE: 3-From toilet/BSC: Mod A (lift or lower assist)  FIM - Bed/Chair Transfer Bed/Chair Transfer Assistive Devices: Bed rails Bed/Chair Transfer: 3: Chair or W/C > Bed: Mod A (lift or lower assist)  FIM - Locomotion: Wheelchair Distance: 30 Locomotion: Wheelchair: 1: Total Assistance/staff pushes wheelchair (Pt<25%) FIM - Locomotion: Ambulation Locomotion: Ambulation Assistive Devices: Parallel bars Ambulation/Gait Assistance: 1: +1 Total assist Locomotion: Ambulation: 1: Two helpers  Comprehension Comprehension Mode: Auditory Comprehension: 2-Understands basic 25 - 49% of the time/requires cueing 51 - 75% of the time  Expression Expression Mode: Verbal Expression: 2-Expresses basic 25 - 49% of the time/requires cueing 50 - 75% of the  time. Uses single words/gestures.  Social Interaction Social Interaction: 2-Interacts appropriately 25 - 49% of time - Needs frequent redirection.  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 DVT Prophylaxis/Anticoagulation: Subcutaneous heparin. Monitor platelet counts and any signs of bleeding   -will d/c sq heparin d/t thrombocytopenia     Component Value Date/Time   WBC 7.9 05/23/2011 0600   HGB 10.5* 05/23/2011 0600   HCT 32.5* 05/23/2011 0600   PLT 81* 05/23/2011 0600   MCV 95.6 05/23/2011 0600   NEUTROABS 18.2* 05/15/2011 0635   LYMPHSABS 2.1 05/15/2011 0635   MONOABS 1.9* 05/15/2011 0635   EOSABS 0.1 05/15/2011 0635   BASOSABS 0.1 05/15/2011 0635       3. Seizure disorder. Depakote 1000 mg twice a day. No seizure activity.  4. Renal failure/MPGN. Mgt per [employer]. Weigh patient daily.   Lab Results  Component Value Date   CREATININE 2.51* 05/21/2011    5. Hypertension. Norvasc 10 mg daily, clonidine patch 0.3 mg weekly, Isordil 10 mg 3 times daily, Lopressor twice a day, minoxidil 10 mg daily.  -bp's less labile currently and generally improved BP Readings from Last 3 Encounters:  05/23/11 120/75  05/08/11 134/80  04/28/11 150/88   She has some edema. I don't think it's worth trying to change medications at this time. I suspect her edema is secondary to amlodipine and inactivity. 6. Anemia. Continue iron supplement. Followup CBC stable 7. Persistent vague foot pain.  Appears to neuropathic.  Also has myoclonus.  Tramadol trial had helped, but we stopped due "tongue swelling" and fatigue  -i suspect a large part of this is behavioral as well. 8.  Leukocytosis- WBC down to 22k.  She's afebrile  -LLL pneumonia on CXR-- po avelox-will stop per below  -recheck cxr improved  -blood cultures negative. 9.- Excessive sleepiness.-She is alert this morning.  -discussed motivation and working through her problems in order to get home.  -klonopin adjusted down, previously  -added ritalin which has really helped  -don't see value in a neuropsych consult as she doesn't have the cognitive awareness to really participate in this. Motivation seems to be improved over the last few days.  10. FEN-added megace  -food from home  -eating better as a whole  -off ivf, see how she maintains on her own 70. Thrombocytopenia-  -sq heparin stopped. Will stop avelox today  -valproic acid can cause thrombocytopenia, but she is at high risk for seizure. Will discuss with pharmacy.  Could consider change to tegretol but this can affect platelets as well.  -platelets have stabilized today.  -will recheck cbc in am and also check a vpa level as this has been trending up lately. Will hold off on adjusting the vpa dose until we see the level in the am. LOS (Days) 15 A FACE TO FACE EVALUATION WAS PERFORMED  Cullen Lahaie HENRY 05/23/2011, 8:45 AM

## 2011-05-23 NOTE — Progress Notes (Signed)
MEDICATION RELATED CONSULT NOTE - INITIAL   Pharmacy Consult for thrombocytopenia with divalproex Indication: seizures  Assessment: 21 yo lady admitted Jan 3 s/p cardiac arrest.  She developed seizures after being rewarmed from hypothermia protocol.  She was started on valproate 1000 mg q12 hours and is currently on depakote 500 mg q6 hours.  Her baseline PTLC was ~300K.  PLTC today 81K.  Thrombocytopenia associated with valproic acid appears to be dose dependent and ranges from 1-24% occurrence.  Since her platelets have started to increase, I would not think that it was due to Divalproex.  It could have been a cumulative effect with the heparin.   She is currently receiving ~37 mg/kg/day of Divalproex.  Her level today is 93.9 mcg/ml which is the upper limit of normal.  Goal of Therapy:  Valproic acid trough 50-100 mcg/ml  Plan:  Consider decreasing her dose to: (500mg  every 8 hours) as she is trending higher (93.9<<83.2<<79.3).  This should provide you with a therapeutic level of ~ 70 mcg/ml.  Would recheck in 3-4 days to ensure therapeutic range.  No Known Allergies  Patient Measurements: Height: 5\' 2"  (157.5 cm) Weight: 115 lb 15.4 oz (52.6 kg) IBW/kg (Calculated) : 50.1    Vital Signs: Temp: 98.4 F (36.9 C) (03/02 0500) Temp src: Oral (03/02 0500) BP: 120/75 mmHg (03/02 0500) Pulse Rate: 91  (03/02 0500) Intake/Output from previous day: 03/01 0701 - 03/02 0700 In: 1200 [P.O.:1200] Out: -  Intake/Output from this shift: Total I/O In: 240 [P.O.:240] Out: -   Labs:  Basename 05/23/11 0600 05/22/11 0854 05/21/11 1102 05/21/11 1057  WBC 7.9 7.8 -- 8.4  HGB 10.5* 10.8* -- 11.1*  HCT 32.5* 33.3* -- 34.2*  PLT 81* 65* -- 54*  APTT -- -- -- --  CREATININE -- -- 2.51* --  LABCREA -- -- -- --  CREATININE -- -- 2.51* --  CREAT24HRUR -- -- -- --  MG -- -- -- --  PHOS -- -- 5.1* --  ALBUMIN -- -- 2.0* --  PROT -- -- -- --  ALBUMIN -- -- 2.0* --  AST -- -- -- --  ALT --  -- -- --  ALKPHOS -- -- -- --  BILITOT -- -- -- --  BILIDIR -- -- -- --  IBILI -- -- -- --   Estimated Creatinine Clearance: 28 ml/min (by C-G formula based on Cr of 2.51).   Microbiology: Recent Results (from the past 720 hour(s))  URINE CULTURE     Status: Normal   Collection Time   04/26/11  8:28 PM      Component Value Range Status Comment   Specimen Description URINE, CLEAN CATCH   Final    Special Requests NONE   Final    Culture  Setup Time [id number]   Final    Colony Count 40,000 COLONIES/ML   Final    Culture     Final    Value: Multiple bacterial morphotypes present, none predominant. Suggest appropriate recollection if clinically indicated.   Report Status 04/28/2011 FINAL   Final   URINE CULTURE     Status: Normal   Collection Time   05/12/11  4:09 PM      Component Value Range Status Comment   Specimen Description URINE, CLEAN CATCH   Final    Special Requests NONE   Final    Culture  Setup Time [id number]   Final    Colony Count NO GROWTH   Final    Culture NO  GROWTH   Final    Report Status 05/13/2011 FINAL   Final   CULTURE, BLOOD (ROUTINE X 2)     Status: Normal   Collection Time   05/13/11  9:07 AM      Component Value Range Status Comment   Specimen Description BLOOD HAND RIGHT   Final    Special Requests BOTTLES DRAWN AEROBIC AND ANAEROBIC 10.[facility] EACH   Final    Culture  Setup Time [id number]   Final    Culture NO GROWTH 5 DAYS   Final    Report Status 05/19/2011 FINAL   Final   CULTURE, BLOOD (ROUTINE X 2)     Status: Normal   Collection Time   05/13/11  9:18 AM      Component Value Range Status Comment   Specimen Description BLOOD HAND LEFT   Final    Special Requests     Final    Value: BOTTLES DRAWN AEROBIC AND ANAEROBIC 10.0 CC BLUE 3.0CC RED   Culture  Setup Time [id number]   Final    Culture NO GROWTH 5 DAYS   Final    Report Status 05/19/2011 FINAL   Final   CULTURE, ROUTINE-ABSCESS     Status: Normal   Collection Time   05/14/11   8:48 AM      Component Value Range Status Comment   Specimen Description ABSCESS MOUTH   Final    Special Requests SWAB TOP/BOTTOM OF TONGUE   Final    Gram Stain     Final    Value: NO WBC SEEN     ABUNDANT SQUAMOUS EPITHELIAL CELLS PRESENT     FEW GRAM POSITIVE COCCI     IN PAIRS RARE GRAM POSITIVE RODS     RARE GRAM NEGATIVE RODS   Culture     Final    Value: MODERATE STAPHYLOCOCCUS AUREUS     Note: RIFAMPIN AND GENTAMICIN SHOULD NOT BE USED AS SINGLE DRUGS FOR TREATMENT OF STAPH INFECTIONS. This organism is presumed to be Clindamycin resistant based on detection of inducible Clindamycin resistance.   Report Status 05/16/2011 FINAL   Final    Organism ID, Bacteria STAPHYLOCOCCUS AUREUS   Final     Medical History: Past Medical History  Diagnosis Date  . Pneumonia last 2 weeks    'walking pneumonia'  . Hypertension   . Seizures   . Chronic kidney disease   . Anemia     Medications:  Scheduled:     . amLODipine  10 mg Oral QHS  . calcium acetate  1,334 mg Oral TID WC  . cholecalciferol  400 Units Oral BID  . clonazePAM  0.25 mg Oral QHS  . cloNIDine  0.3 mg Transdermal Weekly  . darbepoetin  100 mcg Subcutaneous Q7 days  . divalproex  500 mg Oral Q6H  . docusate sodium  100 mg Oral BID  . famotidine  20 mg Oral Q supper  . feeding supplement  237 mL Oral BID  . furosemide  160 mg Oral BID  . isosorbide dinitrate  20 mg Oral TID  . losartan  100 mg Oral BID  . megestrol  400 mg Oral BID  . methylphenidate  5 mg Oral BID WC  . metoprolol tartrate  25 mg Oral BID  . minoxidil  5 mg Oral BID  . nystatin  5 mL Oral QID  . polyethylene glycol  17 g Oral Daily    Nadara Mustard, PharmD., MS Clinical Pharmacist Pager:  [phone]  05/23/2011,1:26 PM

## 2011-05-24 NOTE — Progress Notes (Signed)
Occupational Therapy Session Note  Patient Details  Name: Heather Sims MRN: [id number] Date of Birth: 1990-10-11  Today's Date: 05/24/2011 Time: 0915-1000 Time Calculation (min): 45 min  Skilled Therapeutic Interventions/Progress Updates: ADL in recliner chair at sink; patient dosed off to sleep often during the session even with back propped up with pillows and required approx 4 tactile cues to attend during the approximate 45 minute session; focus on sustained attention and standing balance given the clonic type movments in her legs.  Her legs seemed to stabilize after standing for about 30 seconds.    Pain: none voiced   See FIM for current functional status  Therapy/Group: Individual Therapy  Bud Face [facility] 05/24/2011, 12:23 PM

## 2011-05-24 NOTE — Progress Notes (Signed)
Patient ID: Heather Sims, female   DOB: 02/24/1991, 21 y.o.   MRN: [id number] Subjective/Complaints 3/2- she has no complaints except that she has noted some swelling of her feet over the past several days. Mother notes that the swelling is worse later on in the day. : ROS--no new complaints.   Objective: Vital Signs: Blood pressure 114/71, pulse 90, temperature 97.9 F (36.6 C), temperature source Axillary, resp. rate 18, height 5\' 2"  (1.575 m), weight 115 lb 15.4 oz (52.6 kg), last menstrual period 04/24/2011, SpO2 98.00%. No results found.  Basename 05/23/11 0600 05/22/11 0854  WBC 7.9 7.8  HGB 10.5* 10.8*  HCT 32.5* 33.3*  PLT 81* 65*    Basename 05/21/11 1102  NA 140  K 3.5  CL 100  CO2 27  GLUCOSE 92  BUN 50*  CREATININE 2.51*  CALCIUM 9.5   CBG (last 3)  No results found for this basename: GLUCAP:3 in the last 72 hours  Wt Readings from Last 3 Encounters:  05/23/11 115 lb 15.4 oz (52.6 kg)  05/07/11 127 lb 13.9 oz (58 kg)  04/28/11 125 lb 3.2 oz (56.79 kg)    Physical Exam:  Neck is supple. Chest is clear to auscultation cardiac exam S1-S2 are regular. Abdominal exam active bowel sounds, soft. Extremities 1+ edema of feet. Very bright and alert this am. More communicative and aware.  Moves all 4's fairly equally.  Still has some discomfort at ankles/feet. Trace edema of feet. Exam 3/3  Assessment/Plan: 1. Functional deficits secondary to anoxic BI/ deconditioning which require 3+ hours per day of interdisciplinary therapy in a comprehensive inpatient rehab setting. Physiatrist is providing close team supervision and 24 hour management of active medical problems listed below. Physiatrist and rehab team continue to assess barriers to discharge/monitor patient progress toward functional and medical goals. FIM: FIM - Bathing Bathing Steps Patient Completed: Chest;Right Arm;Left Arm;Abdomen;Right upper leg;Left upper leg Bathing: 4: Steadying assist  FIM - Upper Body  Dressing/Undressing Upper body dressing/undressing steps patient completed: Put head through opening of pull over shirt/dress;Pull shirt over trunk;Thread/unthread right sleeve of front closure shirt/dress Upper body dressing/undressing: 4: Min-Patient completed 75 plus % of tasks FIM - Lower Body Dressing/Undressing Lower body dressing/undressing steps patient completed: Thread/unthread right underwear leg (in w/c at sink.  Assisted with drsg when awake) Lower body dressing/undressing: 1: Total-Patient completed less than 25% of tasks  FIM - Toileting Toileting steps completed by patient: Performs perineal hygiene Toileting Assistive Devices: Grab bar or rail for support Toileting: 0: Activity did not occur  FIM - Diplomatic Services operational officer Devices: Psychiatrist Transfers: 0-Activity did not occur Toilet Transfers DO NOT USE: 3-From toilet/BSC: Mod A (lift or lower assist)  FIM - Banker Devices: Bed rails Bed/Chair Transfer: 3: Chair or W/C > Bed: Mod A (lift or lower assist)  FIM - Locomotion: Wheelchair Distance: 30 Locomotion: Wheelchair: 1: Travels less than 50 ft with moderate assistance (Pt: 50 - 74%) FIM - Locomotion: Ambulation Locomotion: Ambulation Assistive Devices: Parallel bars Ambulation/Gait Assistance: 1: +1 Total assist Locomotion: Ambulation: 1: Two helpers  Comprehension Comprehension Mode: Auditory Comprehension: 3-Understands basic 50 - 74% of the time/requires cueing 25 - 50%  of the time  Expression Expression Mode: Verbal Expression: 5-Expresses basic needs/ideas: With extra time/assistive device  Social Interaction Social Interaction Mode:  (required tactile prompts to wake up and participate) Social Interaction: 4-Interacts appropriately 75 - 89% of the time - Needs redirection for appropriate language or to  initiate interaction. 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 DVT Prophylaxis/Anticoagulation: Subcutaneous heparin. Monitor platelet counts and any signs of bleeding   -will d/c sq heparin d/t thrombocytopenia     Component Value Date/Time   WBC 7.9 05/23/2011 0600   HGB 10.5* 05/23/2011 0600   HCT 32.5* 05/23/2011 0600   PLT 81* 05/23/2011 0600   MCV 95.6 05/23/2011 0600   NEUTROABS 18.2* 05/15/2011 0635   LYMPHSABS 2.1 05/15/2011 0635   MONOABS 1.9* 05/15/2011 0635   EOSABS 0.1 05/15/2011 0635   BASOSABS 0.1 05/15/2011 0635       3. Seizure disorder. Depakote 1000 mg twice a day. No seizure activity.  4. Renal failure/MPGN. Mgt per [employer]. Weigh patient daily.   Lab Results  Component Value Date   CREATININE 2.51* 05/21/2011    5. Hypertension. Norvasc 10 mg daily, clonidine patch 0.3 mg weekly, Isordil 10 mg 3 times daily, Lopressor twice a day, minoxidil 10 mg daily.  -bp's less labile currently and generally improved BP Readings from Last 3 Encounters:  05/24/11 114/71  05/08/11 134/80  04/28/11 150/88   She has some edema. I don't think it's worth trying to change medications at this time. I suspect her edema is secondary to amlodipine and inactivity. Blood pressure is better controlled. 6. Anemia. Continue iron supplement. Followup CBC stable 7. Persistent vague foot pain.  Appears to neuropathic.  Also has myoclonus.  Tramadol trial had helped, but we stopped due "tongue swelling" and fatigue  -i suspect a large part of this is behavioral as well. 8. Leukocytosis- WBC down to 22k.  She's afebrile  -LLL pneumonia on CXR-- po avelox-will stop per below  -recheck cxr  improved  -blood cultures negative. 9.- Excessive sleepiness.-She is alert this morning.  -discussed motivation and working through her problems in order to get home.  -klonopin adjusted down, previously  -added ritalin which has really helped  -don't see value in a neuropsych consult as she doesn't have the cognitive awareness to really participate in this. Motivation seems to be improved over the last few days.  10. FEN-added megace  -food from home  -eating better as a whole  -off ivf, see how she maintains on her own 78. Thrombocytopenia-  -sq heparin stopped. Will stop avelox today  -valproic acid can cause thrombocytopenia, but she is at high risk for seizure. Will discuss with pharmacy.  Could consider change to tegretol but this can affect platelets as well.  -platelets have stabilized today.  -will recheck cbc in am and also check a vpa level as this has been trending up lately. Will hold off on adjusting the vpa dose until we see the level in the am. LOS (Days) 16 A FACE TO FACE EVALUATION WAS PERFORMED  Kylan Veach HENRY 05/24/2011, 10:08 AM

## 2011-05-24 NOTE — Progress Notes (Signed)
Patient reports increased swelling to BLE. Encouraged patient to elevate lower extremities while in the bed or chair. Parents at the bedside at this time and aware. Will continue to monitor for increased swelling.

## 2011-05-25 LAB — BASIC METABOLIC PANEL
BUN: 61 mg/dL — ABNORMAL HIGH (ref 6–23)
Calcium: 9.6 mg/dL (ref 8.4–10.5)
Creatinine, Ser: 2.82 mg/dL — ABNORMAL HIGH (ref 0.50–1.10)
GFR calc Af Amer: 26 mL/min — ABNORMAL LOW (ref >90)
GFR calc non Af Amer: 23 mL/min — ABNORMAL LOW (ref >90)
Potassium: 3.8 mEq/L (ref 3.5–5.1)

## 2011-05-25 LAB — CBC
Hemoglobin: 10.5 g/dL — ABNORMAL LOW (ref 12.0–15.0)
MCH: 31.2 pg (ref 26.0–34.0)
MCHC: 32.3 g/dL (ref 30.0–36.0)
Platelets: 127 10*3/uL — ABNORMAL LOW (ref 150–400)
RDW: 19.5 % — ABNORMAL HIGH (ref 11.5–15.5)

## 2011-05-25 MED ORDER — DIVALPROEX SODIUM 125 MG PO CPSP
500.0000 mg | ORAL_CAPSULE | Freq: Three times a day (TID) | ORAL | Status: DC
  Administered 2011-05-25 – 2011-05-27 (×6): 500 mg via ORAL
  Filled 2011-05-25 (×9): qty 4

## 2011-05-25 NOTE — Progress Notes (Signed)
Nutrition Follow-up  Advanced to Regular diet with thin liquids. Pt consuming 50-60% of meals. Intake has improved since last RD visit. Noted pt on megace BID. Getting food from home. Pt states that she feels as though she is eating better. RN reports attention is improving and pt is taking supplements. Does better with foods outside of hospital.  Diet Order:  Regular with thin liquids Supplements: Ensure Clinical Strength PO BID  Meds: Scheduled Meds:   . amLODipine  10 mg Oral QHS  . calcium acetate  1,334 mg Oral TID WC  . cholecalciferol  400 Units Oral BID  . clonazePAM  0.25 mg Oral QHS  . cloNIDine  0.3 mg Transdermal Weekly  . darbepoetin  100 mcg Subcutaneous Q7 days  . divalproex  500 mg Oral Q8H  . docusate sodium  100 mg Oral BID  . famotidine  20 mg Oral Q supper  . feeding supplement  237 mL Oral BID  . furosemide  160 mg Oral BID  . isosorbide dinitrate  20 mg Oral TID  . losartan  100 mg Oral BID  . megestrol  400 mg Oral BID  . methylphenidate  5 mg Oral BID WC  . metoprolol tartrate  25 mg Oral BID  . minoxidil  5 mg Oral BID  . nystatin  5 mL Oral QID  . polyethylene glycol  17 g Oral Daily  . DISCONTD: divalproex  500 mg Oral Q6H   Continuous Infusions:  PRN Meds:.acetaminophen, albuterol, diphenhydrAMINE, heparin, polyethylene glycol, promethazine, promethazine, promethazine, sorbitol  Labs:  CMP     Component Value Date/Time   NA 141 05/25/2011 0615   K 3.8 05/25/2011 0615   CL 99 05/25/2011 0615   CO2 32 05/25/2011 0615   GLUCOSE 89 05/25/2011 0615   BUN 61* 05/25/2011 0615   CREATININE 2.82* 05/25/2011 0615   CALCIUM 9.6 05/25/2011 0615   PROT 5.9* 05/14/2011 0557   ALBUMIN 2.0* 05/21/2011 1102   AST 15 05/14/2011 0557   ALT <5 05/14/2011 0557   ALKPHOS 56 05/14/2011 0557   BILITOT 0.2* 05/14/2011 0557   GFRNONAA 23* 05/25/2011 0615   GFRAA 26* 05/25/2011 0615  Phosphorus 5.1H   Intake/Output Summary (Last 24 hours) at 05/25/11 0950 Last data filed at 05/24/11  1830  Gross per 24 hour  Intake    720 ml  Output      4 ml  Net    716 ml    Weight Status:  52.6 kg, wt stable  Estimated needs:  1650 - 1925 kcal, 55 - 66 grams protein, 1.6 - 1.9 L/d  Nutrition Dx:  Inadequate oral intake r/t attention and poor appetite AEB nursing report.  Goal:  Pt to consume >/= 75% of meals and supplements. Unmet, improving.  Intervention:  Continue current interventions; intake is improving.  Monitor:  PO intake, weights, labs, I/O's, supplements  Adair Laundry Pager #:  [phone]

## 2011-05-25 NOTE — Progress Notes (Signed)
Occupational Therapy Session Note  Patient Details  Name: Heather Sims MRN: [id number] Date of Birth: 1990-12-13  Today's Date: 05/25/2011 Time: 1130-1200 Time Calculation (min): 30 min  Short Term Goals: Week 2:  OT Short Term Goal 1 (Week 2): Pt would perform sit to stand for clothing managment with mod A OT Short Term Goal 2 (Week 2): Pt will transfer to [facility] with mod A squat pivot OT Short Term Goal 3 (Week 2): Pt will thread underwear and pants with min A OT Short Term Goal 4 (Week 2): Pt will demonstrated sustained attention for 5 min to complete grooming tasks OT Short Term Goal 5 (Week 2): Pt would maintain sitting balance with supervision at EOB during a functional task  Skilled Therapeutic Interventions/Progress Updates:    1:1 8:30-9:30 family education with pt's mom and patient on self care tasks in a simulated like home environment. Focus on bathing and dressing at sink level using sink like a grab bar to assist with forward posture and bilateral UE support, sit to stands, toilet transfers, toileting sit to stand, transfers in and out of w/c, approaches for behavioral issues, home set up, recommendations for DME and therapy f/u.  1:1 11:30-12:00 2nd session continued family education with pt's mom on tub bench transfer in bathroom with a tub and a shower stall, remaining seated in shower for bathing, bed mobility in regular bed (bed<> w/c), possible for a bed rail to assist with pt's ability to transfer holding onto a rail. Pt with difficulty generalizing concepts for transfers and sit to stands in all situations.   Therapy Documentation Precautions:  Precautions Precautions: Fall Precaution Comments: extends trunk at times, severe myoclonus today Restrictions Weight Bearing Restrictions: No Pain: Pain Assessment Pain Assessment: No/denies pain  See FIM for current functional status  Therapy/Group: Individual Therapy  [name] 05/25/2011, 3:14 PM

## 2011-05-25 NOTE — Progress Notes (Signed)
Social Work   Met with patient and mother this morning to discuss d/c planning issues and mother's "readiness" to become f/t caregiver.  Mother reports that she feels comfortable with providing needed physical assistance to pt, but admits the behavioral issues are "very frustrating".  Therapies are continuing to work through and problem solve behavioral challenges with mother and provide her with suggestions to decrease her level of frustration.   Mother mostly concerned with the anticipated cost of meds for pt at d/c.  I have discussed this with PA.  Pt. Has appointments at [facility] mid March and can obtain meds there at that point.  There will be approximately 2 weeks of meds that may be "private pay", however I am continuing to investigate any other assistance programs available. This is a very unfortunate situation as pt only has a pending Medicaid app (updated notes sent to [facility] reviewer) and [facility] therapy visits will be very limited.   Anticipate the caregiver stress may be very high.  OT suggested the local BI Support group as one resource - I will be providing mother with info and encourage her to join [place] this month.  Once pt's Medicaid is approved, pt will be eligible for [facility] Aide services that will also "lighten the load" on mother a little. Will continue to investigate any other support resources for pt and mother and provide emotional support as they near pt's d/c date.  Hilding Quintanar

## 2011-05-25 NOTE — Progress Notes (Signed)
Speech Language Pathology Daily Session Note  Patient Details  Name: Heather Sims MRN: [id number] Date of Birth: [birth year]  Today's Date: 05/25/2011 Time: 4098-1191 Time Calculation (min): 45 min  Short Term Goals:  SLP Short Term Goal 1 (Week 2): Pt will utilize increased voice intensity to increse speech intelligilbility at the sentence level with supervision verbal cues. SLP Short Term Goal 2 (Week 2): Pt will demonstrate functional problem solving for basic and familair tasks with Mod A verbal cues SLP Short Term Goal 3 (Week 2): Pt will demonstrate sustained attention to functional tasks for ~5 mins with Mod A verbal cues SLP Short Term Goal 4 (Week 2): Pt will utilize enviornmental and contextual cues to orient to time, place, and situation with Mod A.   Skilled Therapeutic Interventions: Treatment focus on family education in regards to current cognitive and swallowing function and strategies to utilize at home. Pt's medication administered whole in puree today. Pt needed increased time and demonstrated increased difficulty. Pt's mother educated on crushing medications and swallowing compensatory strategies to utilize at home with regular textures and thin liquids.  Both verbalized and demonstrated understanding and reported no further questions at this time.   Daily Session FIM:  Comprehension Comprehension Mode: Auditory Comprehension: 4-Understands basic 75 - 89% of the time/requires cueing 10 - 24% of the time Expression Expression Mode: Verbal Expression: 5-Expresses basic 90% of the time/requires cueing < 10% of the time. Social Interaction Social Interaction: 4-Interacts appropriately 75 - 89% of the time - Needs redirection for appropriate language or to initiate interaction. Problem Solving Problem Solving: 3-Solves basic 50 - 74% of the time/requires cueing 25 - 49% of the time Memory Memory: 3-Recognizes or recalls 50 - 74% of the time/requires cueing 25 - 49% of the  time FIM - Eating Eating Activity: 5: Set-up assist for open containers  Pain Pain Assessment Pain Assessment: No/denies pain  Therapy/Group: Individual Therapy  Natanel Snavely 05/25/2011, 12:09 PM

## 2011-05-25 NOTE — Progress Notes (Signed)
Subjective/Complaints 3/4-no new issues : ROS--no new complaints.   Objective: Vital Signs: Blood pressure 128/85, pulse 86, temperature 98.3 F (36.8 C), temperature source Axillary, resp. rate 18, height 5\' 2"  (1.575 m), weight 52.6 kg (115 lb 15.4 oz), last menstrual period 04/24/2011, SpO2 99.00%. No results found.  Basename 05/23/11 0600 05/22/11 0854  WBC 7.9 7.8  HGB 10.5* 10.8*  HCT 32.5* 33.3*  PLT 81* 65*   No results found for this basename: NA:2,K:2,CL:2,CO2:2,GLUCOSE:2,BUN:2,CREATININE:2,CALCIUM:2 in the last 72 hours CBG (last 3)  No results found for this basename: GLUCAP:3 in the last 72 hours  Wt Readings from Last 3 Encounters:  05/23/11 52.6 kg (115 lb 15.4 oz)  05/07/11 58 kg (127 lb 13.9 oz)  04/28/11 56.79 kg (125 lb 3.2 oz)    Physical Exam:  W Her pupils do react to light. Neck is supple. Chest is clear to auscultation cardiac exam S1-S2 are regular. Abdominal exam active bowel sounds, soft. Very bright and alert this am. More communicative and aware. Occasional clonus seen in LE's. Moves all 4's fairly equally.  Still has some discomfort at ankles/feet. pehaps a little more swelling Exam 3/4  Assessment/Plan: 1. Functional deficits secondary to anoxic BI/ deconditioning which require 3+ hours per day of interdisciplinary therapy in a comprehensive inpatient rehab setting. Physiatrist is providing close team supervision and 24 hour management of active medical problems listed below. Physiatrist and rehab team continue to assess barriers to discharge/monitor patient progress toward functional and medical goals.   FIM: FIM - Bathing Bathing Steps Patient Completed: Chest;Right Arm;Left Arm;Abdomen;Right upper leg;Left upper leg;Right lower leg (including foot);Left lower leg (including foot) (patient sleeping and required tactiles cues to stay awake) Bathing: 4: Min-Patient completes 8-9 32f 10 parts or 75+ percent (clonic type movements in legs before and  initially w standin)  FIM - Upper Body Dressing/Undressing Upper body dressing/undressing steps patient completed: Put head through opening of pull over shirt/dress;Pull shirt over trunk;Thread/unthread right sleeve of front closure shirt/dress Upper body dressing/undressing: 4: Min-Patient completed 75 plus % of tasks FIM - Lower Body Dressing/Undressing Lower body dressing/undressing steps patient completed: Thread/unthread right underwear leg (in w/c at sink.  Assisted with drsg when awake) Lower body dressing/undressing: 1: Total-Patient completed less than 25% of tasks  FIM - Toileting Toileting steps completed by patient: Performs perineal hygiene Toileting Assistive Devices: Grab bar or rail for support Toileting: 0: Activity did not occur  FIM - Diplomatic Services operational officer Devices: Psychiatrist Transfers: 0-Activity did not occur Toilet Transfers DO NOT USE: 3-From toilet/BSC: Mod A (lift or lower assist)  FIM - Banker Devices: Bed rails Bed/Chair Transfer: 3: Chair or W/C > Bed: Mod A (lift or lower assist)  FIM - Locomotion: Wheelchair Distance: 30 Locomotion: Wheelchair: 1: Travels less than 50 ft with moderate assistance (Pt: 50 - 74%) FIM - Locomotion: Ambulation Locomotion: Ambulation Assistive Devices: Parallel bars Ambulation/Gait Assistance: 1: +1 Total assist Locomotion: Ambulation: 1: Two helpers  Comprehension Comprehension Mode: Auditory Comprehension: 3-Understands basic 50 - 74% of the time/requires cueing 25 - 50%  of the time  Expression Expression Mode: Verbal Expression: 5-Expresses basic needs/ideas: With extra time/assistive device  Social Interaction Social Interaction Mode:  (required tactile prompts to wake up and participate) Social Interaction: 4-Interacts appropriately 75 - 89% of the time - Needs redirection for appropriate language or to initiate interaction.  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 DVT Prophylaxis/Anticoagulation: Subcutaneous heparin. Monitor platelet counts and any signs of bleeding   -will d/c sq heparin d/t thrombocytopenia  -recheck platelets today.  3. Seizure disorder. Depakote 1000 mg twice a day. No seizure activity.  4. Renal failure/MPGN. Mgt per [employer]. Weigh patient daily.   Lab Results  Component Value Date   CREATININE 2.51* 05/21/2011    5. Hypertension. Norvasc 10 mg daily, clonidine patch 0.3 mg weekly, Isordil 10 mg 3 times daily, Lopressor twice a day, minoxidil 10 mg daily.  -bp's less labile currently and generally improved BP Readings from Last 3 Encounters:  05/25/11 128/85  05/08/11 134/80  04/28/11 150/88    6. Anemia. Continue iron supplement. Followup CBC stable 7. Persistent vague foot pain.  Appears to neuropathic.  Also has myoclonus.  Tramadol trial had helped, but we stopped due "tongue swelling" and fatigue  -i suspect a large part of this is behavioral as well. 8. Leukocytosis- WBC down to 22k.  She's afebrile  -avelox stopped  -recheck cxr improved  -blood cultures negative. 9.- Excessive sleepiness.- THIS IS LARGELY BEHAVIORAL.  DISCUSSED AT LENGTH WITH PATIENT AND MOTHER (WHO AGREES)  -discussed motivation and working through her problems in order to get home.  -klonopin adjusted down.  -added ritalin which has really helped  -don't see value in a neuropsych consult as she doesn't have the cognitive awareness to really participate in this. Motivation seems to be improved over the last few days.  10. FEN-added megace  -food from home  -eating better as a  whole  -off ivf, see how she maintains on her own 57. Thrombocytopenia-  -sq heparin stopped. Will stop avelox today  -valproic acid- will decrease to q8 hours given level from Saturday.  -platelets showing some improvement  -recheck cbc today.  LOS (Days) 17 A FACE TO FACE EVALUATION WAS PERFORMED  Alejo Beamer T 05/25/2011, 7:06 AM

## 2011-05-25 NOTE — Progress Notes (Signed)
Physical Therapy Note  Patient Details  Name: Heather Sims MRN: [id number] Date of Birth: Mar 08, 1991 Today's Date: 05/25/2011  Time: 1330-1426 56 minutes  No c/o pain.  Treatment session focused on pt/family ed with pt's mother.  W/c mobility demo with mother encouraging pt to participate with mod cuing 68' with supervision, pt with improved w/c negotiation around obstacles today.  Car transfer with pt's mother demonstrating safely to sedan height vehicle with min-mod A.  Gait training for demo to pt's family: gait in parallel bars with mod A 8'.  Pt with decreased myoclonus noted today ? Due to pt with decreased anxiety due to mother present.  Pt agreeable with max encouragement to gait with RW.  Pt performed with PT, then with mother gait with RW 2 x 10' with mod A.  Pt with decreased cadence, requires cues for wt shifts and continued encouragement to decrease anxiety.  Pt with increased myoclonus with fatigue, requires a close w/c follow for safety.  Pt/mother educated to not attempt gait at home without 2 helpers and were encouraged to wait for HHPT to attempt at first before attempting gait in their household environment.  Pt's mother expresses understanding.  Individual therapy   [name] 05/25/2011, 5:15 PM

## 2011-05-26 LAB — VALPROIC ACID LEVEL: Valproic Acid Lvl: 96.8 ug/mL (ref 50.0–100.0)

## 2011-05-26 MED ORDER — LOSARTAN POTASSIUM 100 MG PO TABS: 100.0000 mg | ORAL_TABLET | Freq: Two times a day (BID) | ORAL | Status: DC

## 2011-05-26 MED ORDER — ISOSORBIDE DINITRATE 20 MG PO TABS: 20.0000 mg | ORAL_TABLET | Freq: Three times a day (TID) | ORAL | Status: DC

## 2011-05-26 MED ORDER — CLONIDINE HCL 0.3 MG PO TABS
0.3000 mg | ORAL_TABLET | Freq: Three times a day (TID) | ORAL | Status: DC
  Filled 2011-05-26 (×2): qty 1

## 2011-05-26 MED ORDER — MINOXIDIL 10 MG PO TABS: 5.0000 mg | ORAL_TABLET | Freq: Two times a day (BID) | ORAL | Status: DC

## 2011-05-26 MED ORDER — CALCIUM ACETATE 667 MG PO CAPS: 1334.0000 mg | ORAL_CAPSULE | Freq: Three times a day (TID) | ORAL | Status: DC

## 2011-05-26 MED ORDER — CLONIDINE HCL 0.3 MG/24HR TD PTWK: 1.0000 | MEDICATED_PATCH | TRANSDERMAL | Status: DC

## 2011-05-26 MED ORDER — ISOSORBIDE MONONITRATE ER 60 MG PO TB24
60.0000 mg | ORAL_TABLET | Freq: Every day | ORAL | Status: DC
  Administered 2011-05-27: 60 mg via ORAL
  Filled 2011-05-26 (×3): qty 1

## 2011-05-26 MED ORDER — METOPROLOL TARTRATE 25 MG PO TABS: ORAL_TABLET | ORAL | Status: DC

## 2011-05-26 MED ORDER — CLONAZEPAM 0.5 MG PO TABS: 0.5000 mg | ORAL_TABLET | Freq: Every day | ORAL | Status: DC

## 2011-05-26 MED ORDER — CLONIDINE HCL 0.3 MG/24HR TD PTWK
0.3000 mg | MEDICATED_PATCH | TRANSDERMAL | Status: DC
  Filled 2011-05-26: qty 1

## 2011-05-26 MED ORDER — CLONAZEPAM 0.5 MG PO TABS: 0.2500 mg | ORAL_TABLET | Freq: Every day | ORAL | Status: DC

## 2011-05-26 MED ORDER — CHOLECALCIFEROL 10 MCG (400 UNIT) PO TABS: 400.0000 [IU] | ORAL_TABLET | Freq: Two times a day (BID) | ORAL | Status: DC

## 2011-05-26 MED ORDER — CALCIUM CARBONATE-VITAMIN D 500-200 MG-UNIT PO TABS
1.0000 | ORAL_TABLET | Freq: Two times a day (BID) | ORAL | Status: DC
  Administered 2011-05-26 – 2011-05-27 (×2): 1 via ORAL
  Filled 2011-05-26 (×4): qty 1

## 2011-05-26 MED ORDER — FERROUS SULFATE 325 (65 FE) MG PO TABS: 325.0000 mg | ORAL_TABLET | Freq: Three times a day (TID) | ORAL | Status: DC

## 2011-05-26 MED ORDER — METOPROLOL TARTRATE 25 MG PO TABS: 25.0000 mg | ORAL_TABLET | Freq: Two times a day (BID) | ORAL | Status: DC

## 2011-05-26 MED ORDER — FUROSEMIDE 80 MG PO TABS: 160.0000 mg | ORAL_TABLET | Freq: Two times a day (BID) | ORAL | Status: DC

## 2011-05-26 MED ORDER — DIVALPROEX SODIUM 500 MG PO DR TAB: 500.0000 mg | DELAYED_RELEASE_TABLET | Freq: Three times a day (TID) | ORAL | Status: DC

## 2011-05-26 MED ORDER — LISINOPRIL 10 MG PO TABS
10.0000 mg | ORAL_TABLET | Freq: Two times a day (BID) | ORAL | Status: DC
  Administered 2011-05-27: 10 mg via ORAL
  Filled 2011-05-26 (×4): qty 1

## 2011-05-26 MED ORDER — AMLODIPINE BESYLATE 10 MG PO TABS: 10.0000 mg | ORAL_TABLET | Freq: Every day | ORAL | Status: DC

## 2011-05-26 NOTE — Progress Notes (Signed)
Occupational Therapy Session Note  Patient Details  Name: Heather Sims MRN: [id number] Date of Birth: 04-04-1990  Today's Date: 05/26/2011 Time: 0830-0930 Time Calculation (min): 60 min    Skilled Therapeutic Interventions/Progress Updates:    1:1 GRAD DAY and continued education with pt and pt's mother on self care/ ADL tasks. Focused on toileting transfer with [facility], toileting sit to stand, proper and effective transfer techniques, tub bench transfers, bathing sitting on tub bench with lateral leans, dressing in shower using grab bar (family plans to put a grab bar in her room at home for sit to stands with dressing), standing tolerance and balance.  1:1 2nd session 11:30-12:00  Focus on finding a place in the triad who could come and install a grab bar in the bed room. Floor transfers in ADL apartment in case of a fall at home. (floor to couch and floor to w/c - when from sitting position on floor to tall kneel, to half kneel to transitional movement with min to mod A to put bottom in chair.  Therapy Documentation Precautions:  Precautions Precautions: Fall Precaution Comments: extends trunk at times, severe myoclonus today Restrictions Weight Bearing Restrictions: No Pain:  no c/o  See FIM for current functional status  Therapy/Group: Individual Therapy  [name] 05/26/2011, 12:13 PM

## 2011-05-26 NOTE — Progress Notes (Signed)
Patient ID: Heather Sims, female   DOB: 1990-12-23, 21 y.o.   MRN: [id number] Subjective/Complaints 3/5- eating better. Slept well.  : ROS--no new complaints.   Objective: Vital Signs: Blood pressure 120/72, pulse 82, temperature 97.3 F (36.3 C), temperature source Axillary, resp. rate 17, height 5\' 2"  (1.575 m), weight 55.4 kg (122 lb 2.2 oz), last menstrual period 04/24/2011, SpO2 97.00%. No results found.  Basename 05/25/11 1536  WBC 10.4  HGB 10.5*  HCT 32.5*  PLT 127*    Basename 05/25/11 0615  NA 141  K 3.8  CL 99  CO2 32  GLUCOSE 89  BUN 61*  CREATININE 2.82*  CALCIUM 9.6   CBG (last 3)  No results found for this basename: GLUCAP:3 in the last 72 hours  Wt Readings from Last 3 Encounters:  05/26/11 55.4 kg (122 lb 2.2 oz)  05/07/11 58 kg (127 lb 13.9 oz)  04/28/11 56.79 kg (125 lb 3.2 oz)    Physical Exam:  W Her pupils do react to light. Neck is supple. Chest is clear to auscultation cardiac exam S1-S2 are regular. Abdominal exam active bowel sounds, soft. Per usual, slower to arouse in the am. More communicative and aware. Occasional clonus seen in LE's. Moves all 4's fairly equally.  Mild edema at ankles. Some pain to touch.  Exam 3/5 Assessment/Plan: 1. Functional deficits secondary to anoxic BI/ deconditioning which require 3+ hours per day of interdisciplinary therapy in a comprehensive inpatient rehab setting. Physiatrist is providing close team supervision and 24 hour management of active medical problems listed below. Physiatrist and rehab team continue to assess barriers to discharge/monitor patient progress toward functional and medical goals.   FIM: FIM - Bathing Bathing Steps Patient Completed: Chest;Right Arm;Left Arm;Abdomen;Right upper leg;Left upper leg;Right lower leg (including foot);Left lower leg (including foot) (patient sleeping and required tactiles cues to stay awake) Bathing: 4: Min-Patient completes 8-9 49f 10 parts or 75+ percent  (clonic type movements in legs before and initially w standin)  FIM - Upper Body Dressing/Undressing Upper body dressing/undressing steps patient completed: Put head through opening of pull over shirt/dress;Pull shirt over trunk;Thread/unthread right sleeve of front closure shirt/dress Upper body dressing/undressing: 4: Min-Patient completed 75 plus % of tasks FIM - Lower Body Dressing/Undressing Lower body dressing/undressing steps patient completed: Thread/unthread right underwear leg (in w/c at sink.  Assisted with drsg when awake) Lower body dressing/undressing: 1: Total-Patient completed less than 25% of tasks  FIM - Toileting Toileting steps completed by patient: Performs perineal hygiene Toileting Assistive Devices: Grab bar or rail for support Toileting: 0: Activity did not occur  FIM - Diplomatic Services operational officer Devices: Psychiatrist Transfers: 0-Activity did not occur Toilet Transfers DO NOT USE: 3-From toilet/BSC: Mod A (lift or lower assist)  FIM - Banker Devices: Bed rails Bed/Chair Transfer: 3: Bed > Chair or W/C: Mod A (lift or lower assist);3: Chair or W/C > Bed: Mod A (lift or lower assist)  FIM - Locomotion: Wheelchair Distance: 30 Locomotion: Wheelchair: 2: Travels 50 - 149 ft with supervision, cueing or coaxing FIM - Locomotion: Ambulation Locomotion: Ambulation Assistive Devices: Parallel bars Ambulation/Gait Assistance: 1: +1 Total assist Locomotion: Ambulation: 1: Two helpers  Comprehension Comprehension Mode: Auditory Comprehension: 4-Understands basic 75 - 89% of the time/requires cueing 10 - 24% of the time  Expression Expression Mode: Verbal Expression: 5-Expresses complex 90% of the time/cues < 10% of the time  Social Interaction Social Interaction Mode:  (required tactile prompts  to wake up and participate) Social Interaction: 4-Interacts appropriately 75 - 89% of the time - Needs  redirection for appropriate language or to initiate interaction.  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 DVT Prophylaxis/Anticoagulation: Subcutaneous heparin. Monitor platelet counts and any signs of bleeding   -will d/c sq heparin d/t thrombocytopenia  -recheck platelets today.  3. Seizure disorder. Depakote decreased.  Check level again in AM 3/6  4. Renal failure/MPGN. Mgt per [employer]. Weigh patient daily.   Lab Results  Component Value Date   CREATININE 2.82* 05/25/2011    5. Hypertension. Norvasc 10 mg daily, clonidine patch 0.3 mg weekly, Isordil 10 mg 3 times daily, Lopressor twice a day, minoxidil 10 mg daily.  -bp's less labile currently and generally improved BP Readings from Last 3 Encounters:  05/26/11 120/72  05/08/11 134/80  04/28/11 150/88    6. Anemia. Continue iron supplement. Followup CBC stable 7. Persistent vague foot pain.  Appears to neuropathic.  Also has myoclonus.  Tramadol trial had helped, but we stopped due "tongue swelling" and fatigue  -i suspect a large part of this is behavioral as well. 8. Leukocytosis- WBC's improved.  She's afebrile  -avelox stopped  -recheck cxr improved  -blood cultures negative. 9.- Excessive sleepiness.- THIS IS LARGELY BEHAVIORAL.  DISCUSSED AT LENGTH WITH PATIENT AND MOTHER (WHO AGREES)  -discussed motivation and working through her problems in order to get home.  -klonopin adjusted down.  -added ritalin which has really helped  -don't see value in a neuropsych consult as she doesn't have the cognitive awareness to really participate in this. Motivation seems to be improved over the last few days.  10. FEN-added megace with benefit.  -food from home  -eating better as a  whole  -off ivf, see how she maintains on her own 89. Thrombocytopenia-  -sq heparin stopped. avelox stopped also  -valproic acid- will decreased. Will recheck level in the am.  -platelets showing some improvement  -platelets rebounding to 127 yesterday.  -recheck labs once more in am before d/c  LOS (Days) 18 A FACE TO FACE EVALUATION WAS PERFORMED  Kaleia Longhi T 05/26/2011, 7:53 AM

## 2011-05-26 NOTE — Progress Notes (Signed)
Physical Therapy Discharge Summary  Patient Details  Name: Heather Sims MRN: [id number] Date of Birth: 10-05-1990  Today's Date: 05/26/2011  Patient has met 7 of 8 long term goals due to improved activity tolerance, improved balance, improved postural control, ability to compensate for deficits, improved attention and improved awareness.  Patient to discharge at a wheelchair level Min Assist in the home, supervision in controlled environment.   Patient's care partner is independent to provide the necessary physical and cognitive assistance at discharge.  Reasons goals not met: Pt not safe to gait without 2 helpers in home environment at this time due to myoclonus and decreased activity tolerance.  Recommendation:  Patient will benefit from ongoing skilled PT services in home health setting to continue to advance safe functional mobility, address ongoing impairments in gait, postural control, balance, activity tolerance, attention, awareness, and minimize fall risk.  Equipment: RW, w/c  Reasons for discharge: treatment goals met and discharge from hospital  Patient/family agrees with progress made and goals achieved: Yes      See FIM for current functional status  [name] 05/26/2011, 3:41 PM

## 2011-05-26 NOTE — Progress Notes (Signed)
Therapeutic Recreation Discharge Summary Patient Details  Name: Heather Sims MRN: [id number] Date of Birth: 1990/10/18  Long term goals set: 1  Long term goals met: 0 Comments on progress toward goals: Pt is progressing toward goal, but does not consistently perform tasks at [place] assist level.  Pt is able to complete simple familiar tasks for short periods of time with set-up assist and min cues, but is very inconsistent with performance at this level.  Pt is limited by decreased cognition and behavioral issues.  Mom has been participatory in session and is aware of the above.  Pt is ready for discharge home tomorrow with mom.   Reasons for discharge: discharge from hospital  Patient/family agrees with progress made and goals achieved: Yes  Evette Diclemente 05/26/2011, 4:27 PM

## 2011-05-26 NOTE — Progress Notes (Signed)
Physical Therapy Note  Patient Details  Name: Heather Sims MRN: [id number] Date of Birth: 25-Nov-1990 Today's Date: 05/26/2011  Time: 1300-1410 70 minutes  No c/o pain.  Pt treatment focus on family ed with pt's mother.  Pt demo's w/c mobility in controlled environment with supervision, household environment with min A.  Pt's mother educated on and returned demo safely multiple attempts at bumping w/c up step for home entry.  Mother expresses comfort and understanding of this.  Gait training with mother providing mod A.  25', 30'.  Pt with improved tolerance for gait today, improves performance with verbal cues to focus and keep balance.  Seated balance training on dyna disc with pt playing uno game with mother and rec therapist.  Pt requires close supervision for balance on dyna disc, requires min cues for attention to game due to increased workload of keeping balance on disc.  Pt and mother express they feel comfortable with all transfers, gait and mobility for d/c.  Pt/mother agree with recommendation to only gait and bump up w/c with 2 helpers at home.  Individual therapy   Duff Pozzi 05/26/2011, 2:12 PM

## 2011-05-26 NOTE — Progress Notes (Signed)
Speech Language Pathology Daily Session Note & Discharge Summary   Patient Details  Name: Heather Sims MRN: [id number] Date of Birth: [birth year]  Today's Date: 05/26/2011 Time: 7829-5621 Time Calculation (min): 45 min  Short Term Goals:  SLP Short Term Goal 1 (Week 2): Pt will utilize increased voice intensity to increse speech intelligilbility at the sentence level with supervision verbal cues. SLP Short Term Goal 2 (Week 2): Pt will demonstrate functional problem solving for basic and familair tasks with Mod A verbal cues SLP Short Term Goal 3 (Week 2): Pt will demonstrate sustained attention to functional tasks for ~5 mins with Mod A verbal cues SLP Short Term Goal 4 (Week 2): Pt will utilize enviornmental and contextual cues to orient to time, place, and situation with Mod A.   Skilled Therapeutic Interventions: Treatment focus on completing family education in regards to current cognitive functioning and strategies to utilize at home to increase overall independence and maximize cognitive function. Pt's mother demonstrated understanding and utilized appropriate cueing throughout session.   Daily Session FIM:  Comprehension Comprehension Mode: Auditory Comprehension: 4-Understands basic 75 - 89% of the time/requires cueing 10 - 24% of the time Expression Expression Mode: Verbal Expression: 4-Expresses basic 75 - 89% of the time/requires cueing 10 - 24% of the time. Needs helper to occlude trach/needs to repeat words. Social Interaction Social Interaction: 4-Interacts appropriately 75 - 89% of the time - Needs redirection for appropriate language or to initiate interaction. Problem Solving Problem Solving: 3-Solves basic 50 - 74% of the time/requires cueing 25 - 49% of the time Memory Memory: 3-Recognizes or recalls 50 - 74% of the time/requires cueing 25 - 49% of the time FIM - Eating Eating Activity: 5: Set-up assist for open containers Pain No/Denies Pain Cognition: Overall  Cognitive Status: Impaired Arousal/Alertness: Awake/alert Orientation Level: Oriented X4 Attention: Selective Focused Attention: Appears intact Sustained Attention: Appears intact Selective Attention: Impaired Selective Attention Impairment: Verbal basic;Functional basic Memory: Impaired Memory Impairment: Decreased short term memory;Decreased recall of new information Decreased Short Term Memory: Verbal basic;Functional basic Awareness: Impaired Awareness Impairment: Intellectual impairment Problem Solving: Impaired Problem Solving Impairment: Verbal basic;Functional basic Executive Function: Reasoning;Sequencing;Organizing;Decision Making;Initiating;Self Monitoring;Self Correcting Reasoning: Impaired Reasoning Impairment: Verbal basic;Functional basic Sequencing: Impaired Sequencing Impairment: Verbal basic;Functional basic Organizing: Impaired Organizing Impairment: Verbal basic;Functional basic Decision Making: Impaired Decision Making Impairment: Verbal basic;Functional basic Initiating: Impaired Initiating Impairment: Verbal basic;Functional basic Self Monitoring: Impaired Self Monitoring Impairment: Verbal basic;Functional basic Self Correcting: Impaired Self Correcting Impairment: Verbal basic;Functional basic Behaviors: Poor frustration tolerance Safety/Judgment: Impaired Oral/Motor: Oral Motor/Sensory Function Overall Oral Motor/Sensory Function: Appears within functional limits for tasks assessed Motor Speech Overall Motor Speech: Impaired Respiration: Within functional limits Phonation: Low vocal intensity (inconsistent ) Resonance: Within functional limits Articulation: Within functional limitis Intelligibility: Intelligibility reduced Word: 75-100% accurate Phrase: 75-100% accurate Sentence: 50-74% accurate Conversation: 50-74% accurate Motor Planning: Witnin functional limits Motor Speech Errors: Not applicable Effective Techniques: Increased vocal  intensity Comprehension: Auditory Comprehension Overall Auditory Comprehension: Impaired Yes/No Questions: Impaired Complex Questions: 25-49% accurate Commands: Impaired Two Step Basic Commands: 50-74% accurate Multistep Basic Commands: 50-74% accurate Conversation: Simple Interfering Components: Attention;Working memory;Processing speed EffectiveTechniques: Buyer, retail: Within Function Limits Reading Comprehension Reading Status: Within funtional limits Expression: Expression Primary Mode of Expression: Verbal Verbal Expression Overall Verbal Expression: Impaired Initiation: Impaired Automatic Speech: Name;Social Response Level of Generative/Spontaneous Verbalization: Sentence Repetition: No impairment Naming: No impairment Responsive: 76-100% accurate Confrontation: Within functional limits Convergent: Not tested Divergent: Not tested Verbal  Errors: Not aware of errors;Language of confusion Pragmatics: Impairment Impairments: Abnormal affect;Topic maintenance;Topic appropriateness Interfering Components: Attention;Speech intelligibility Effective Techniques: Semantic cues;Open ended questions Written Expression Dominant Hand: Right Written Expression: Not tested  Therapy/Group: Individual Therapy  Gerardo Caiazzo 05/26/2011, 11:02 AM   Speech Language Pathology Discharge Summary  Patient Details  Name: Heather Sims MRN: [id number] Date of Birth: 1990-12-22  Today's Date: 05/26/2011 Time: 4098-1191 Time Calculation (min): 45 min  Patient has met 4 of 5 long term goals this admission.  Patient to discharge at overall Min-Mod Assist level.  Patient's care partner is independent to provide the necessary cognitive assistance at discharge.  Reasons goals not met: Pt continues to demonstrate poor insight/intellectual awareness of cognitive deficits.   Recommendation:  Patient will benefit  from ongoing skilled SLP services in a home health setting to continue to advance functional skills in the area of functional communication, maximize cognitive function and overall independence and reduce care partner burden.  Equipment: N/A  Reasons for discharge: treatment goals met and discharge from hospital  Patient/family agrees with progress made and goals achieved: Yes  See FIM for current functional status  Sibyl Mikula 05/26/2011, 11:03 AM

## 2011-05-26 NOTE — Progress Notes (Signed)
Occupational Therapy Discharge Summary  Patient Details  Name: Heather Sims MRN: [id number] Date of Birth: Oct 23, 1990  Today's Date: 05/26/2011 Time: 1300-1410 Time Calculation (min): 70 min  Patient has met 7 of 7 long term goals due to improved activity tolerance, improved balance, postural control, ability to compensate for deficits, improved attention, improved awareness and improved coordination.  Patient to discharge at [facility] Assist level for basic transfers, bed mobility, bathing (at sink and shower level) and dressing, pt is overall max A for toileting secondary to needing bilateral UE support in standing for clothing managment. Pt is still requiring +2 for safe ambulation with a RW - which PT did education. The pt's bathroom is not w/c accessible so until patient is performing ambulation with less A pt's father will carry pt into bathroom for shower once a week.  Pt will bathe at sink in kitchen or bedside basin at other times. Pt does display some behavioral issues with mobility and ADL tasks- Mom has been educated on strategies for home.   It has been recommended for a grab bar to be put in pt's bed room for sit to stands with dressing to decreased the burden of care. Patient's care partner has completed education and is independent to provide the necessary physical and cognitive assistance at discharge.    Reasons goals not met: n/a  Recommendation:  Patient will benefit from ongoing skilled OT services in home health setting to continue to advance functional skills in the area of BADL to decreased burden of care on mother and father.  Equipment: tub bench, w/c with cushion, drop arm commode  Reasons for discharge: treatment goals met and discharge from hospital  Patient/family agrees with progress made and goals achieved: Yes  OT Discharge Precautions/Restrictions  Precautions Precautions: Fall Required Braces or Orthoses: No Restrictions Weight Bearing Restrictions:  No General   Vital Signs   Pain   ADL ADL Eating: Set up Where Assessed-Eating: Chair Grooming: Setup Where Assessed-Grooming: Sitting at sink Upper Body Bathing: Supervision/safety Where Assessed-Upper Body Bathing: Wheelchair Lower Body Bathing: Minimal assistance Where Assessed-Lower Body Bathing: Wheelchair Upper Body Dressing: Supervision/safety Where Assessed-Upper Body Dressing: Chair Lower Body Dressing: Minimal assistance Where Assessed-Lower Body Dressing: Chair Toileting: Maximal assistance Where Assessed-Toileting: Bedside Commode Toilet Transfer: Minimal assistance Toilet Transfer Method: Ambulance person: Bedside commode Tub/Shower Transfer: Minimal assistance Tub/Shower Transfer Method: Squat pivot Tub/Shower Equipment: Emergency planning/management officer Vision/Perception  Vision - History Baseline Vision: No visual deficits Patient Visual Report: No change from baseline Vision - Assessment Eye Alignment: Within Functional Limits  Cognition Overall Cognitive Status: Impaired Arousal/Alertness: Awake/alert Orientation Level: Oriented X4 Attention: Selective Focused Attention: Appears intact Sustained Attention: Appears intact Sustained Attention Impairment: Verbal basic;Functional basic Selective Attention: Impaired Selective Attention Impairment: Verbal basic;Functional basic Memory: Impaired Memory Impairment: Decreased short term memory;Decreased recall of new information Decreased Short Term Memory: Verbal basic;Functional basic Awareness: Impaired Awareness Impairment: Intellectual impairment Problem Solving: Impaired Problem Solving Impairment: Verbal basic;Functional basic Executive Function: Reasoning;Sequencing;Organizing;Decision Making;Initiating;Self Monitoring;Self Correcting Reasoning: Impaired Reasoning Impairment: Verbal basic;Functional basic Sequencing: Impaired Sequencing Impairment: Verbal basic;Functional  basic Organizing: Impaired Organizing Impairment: Verbal basic;Functional basic Decision Making: Impaired Decision Making Impairment: Verbal basic;Functional basic Initiating: Impaired Initiating Impairment: Verbal basic;Functional basic Self Monitoring: Impaired Self Monitoring Impairment: Verbal basic;Functional basic Self Correcting: Impaired Self Correcting Impairment: Verbal basic;Functional basic Behaviors: Poor frustration tolerance Safety/Judgment: Impaired Sensation Sensation Light Touch: Impaired Detail Light Touch Impaired Details: Impaired LLE;Impaired RLE Stereognosis: Appears Intact Hot/Cold: Appears Intact Coordination Gross  Motor Movements are Fluid and Coordinated: No Fine Motor Movements are Fluid and Coordinated: No Coordination and Movement Description: myoclonus Motor  Motor Motor - Discharge Observations: myoclonic jerking bilateral LEs Mobility  Bed Mobility Sitting - Scoot to Edge of Bed: 4: Min assist Sit to Supine: 5: Set up Transfers Transfers: Yes Sit to Stand: 4: Min assist Stand to Sit: 4: Min assist  Trunk/Postural Assessment  Cervical Assessment Cervical Assessment: Within Functional Limits Thoracic Assessment Thoracic Assessment: Within Functional Limits Lumbar Assessment Lumbar Assessment: Exceptions to [facility] (posterior pelvic tilt) Postural Control Postural Control: Within Functional Limits  Balance Static Sitting Balance Static Sitting - Balance Support: Feet unsupported;No upper extremity supported Static Sitting - Level of Assistance: 5: Stand by assistance Static Standing Balance Static Standing - Balance Support: During functional activity;Bilateral upper extremity supported Static Standing - Level of Assistance: 4: Min assist Extremity/Trunk Assessment RUE Assessment RUE Assessment: Exceptions to [facility] RUE Strength RUE Overall Strength: Deficits;Due to impaired cognition (3/5) LUE Assessment LUE Assessment: Exceptions to  [facility] LUE Strength LUE Overall Strength: Deficits;Due to impaired cognition (3/5)  See FIM for current functional status  [name] 05/26/2011, 3:38 PM

## 2011-05-26 NOTE — Progress Notes (Signed)
Patient Details  Name: Clydean Posas MRN: [id number] Date of Birth: [birth year]  Today's Date: 05/26/2011 Time: 4098-1191 Skilled Therapeutic Interventions/Progress Updates: Discharge planning with mother and pt.  Demonstrated bumping a wheelchair up and down a curb/steps using 2 people and then mom returned demonstration with +2 assist. Sat on dyna- disc to play UNO (activity of pt's choice) with min-mod verbal cues. Pt requires close supervision for balance and min-mod questioning cues for attention and problem solving during UNO.  No c/o pain.  Therapy/Group: Other: co-treat with PT  Activity Level: Moderate:   Johnnisha Forton 05/26/2011, 4:18 PM

## 2011-05-27 DIAGNOSIS — R5381 Other malaise: Secondary | ICD-10-CM

## 2011-05-27 DIAGNOSIS — G931 Anoxic brain damage, not elsewhere classified: Secondary | ICD-10-CM

## 2011-05-27 DIAGNOSIS — Z5189 Encounter for other specified aftercare: Secondary | ICD-10-CM

## 2011-05-27 DIAGNOSIS — N179 Acute kidney failure, unspecified: Secondary | ICD-10-CM

## 2011-05-27 DIAGNOSIS — J159 Unspecified bacterial pneumonia: Secondary | ICD-10-CM

## 2011-05-27 LAB — CBC
MCHC: 32 g/dL (ref 30.0–36.0)
Platelets: 159 10*3/uL (ref 150–400)
RDW: 19.8 % — ABNORMAL HIGH (ref 11.5–15.5)
WBC: 10 10*3/uL (ref 4.0–10.5)

## 2011-05-27 MED ORDER — CLONIDINE HCL 0.3 MG/24HR TD PTWK: 0.3000 | MEDICATED_PATCH | TRANSDERMAL | Status: DC

## 2011-05-27 MED ORDER — ISOSORBIDE MONONITRATE ER 60 MG PO TB24: 60.0000 mg | ORAL_TABLET | Freq: Every day | ORAL | Status: DC

## 2011-05-27 MED ORDER — LISINOPRIL 10 MG PO TABS: 10.0000 mg | ORAL_TABLET | Freq: Two times a day (BID) | ORAL | Status: DC

## 2011-05-27 MED ORDER — CALCIUM CARBONATE-VITAMIN D 500-200 MG-UNIT PO TABS: 1.0000 | ORAL_TABLET | Freq: Two times a day (BID) | ORAL | Status: DC

## 2011-05-27 NOTE — Progress Notes (Signed)
No questions about dc, to front door via w/c accompanied by family

## 2011-05-27 NOTE — Progress Notes (Signed)
Patient ID: Heather Sims, female   DOB: [birth year], 21 y.o.   MRN: [id number] Patient ID: Heather Sims, female   DOB: 24-Jul-1990, 21 y.o.   MRN: [id number] Subjective/Complaints 3/6- for d/c today : ROS--no new complaints.   Objective: Vital Signs: Blood pressure 136/92, pulse 85, temperature 97.4 F (36.3 C), temperature source Oral, resp. rate 17, height 5\' 2"  (1.575 m), weight 55.5 kg (122 lb 5.7 oz), last menstrual period 04/24/2011, SpO2 96.00%. No results found.  Basename 05/27/11 0525 05/25/11 1536  WBC 10.0 10.4  HGB 11.6* 10.5*  HCT 36.3 32.5*  PLT 159 127*    Basename 05/25/11 0615  NA 141  K 3.8  CL 99  CO2 32  GLUCOSE 89  BUN 61*  CREATININE 2.82*  CALCIUM 9.6   CBG (last 3)  No results found for this basename: GLUCAP:3 in the last 72 hours  Wt Readings from Last 3 Encounters:  05/27/11 55.5 kg (122 lb 5.7 oz)  05/07/11 58 kg (127 lb 13.9 oz)  04/28/11 56.79 kg (125 lb 3.2 oz)    Physical Exam:  W Her pupils do react to light. Neck is supple. Chest is clear to auscultation cardiac exam S1-S2 are regular. Abdominal exam active bowel sounds, soft. Per usual, slower to arouse in the am. More communicative and aware. Occasional clonus seen in LE's still. Moves all 4's fairly equally.  Mild edema at ankles. Some pain to touch.  Exam 3/6 Assessment/Plan: 1. Functional deficits secondary to anoxic BI/ deconditioning which require 3+ hours per day of interdisciplinary therapy in a comprehensive inpatient rehab setting. Physiatrist is providing close team supervision and 24 hour management of active medical problems listed below. Physiatrist and rehab team continue to assess barriers to discharge/monitor patient progress toward functional and medical goals.  F/u with me in 4-6 weeks   FIM: FIM - Bathing Bathing Steps Patient Completed: Chest;Right Arm;Left Arm;Abdomen;Front perineal area;Buttocks;Right lower leg (including foot);Left upper leg;Right upper  leg;Left lower leg (including foot) Bathing: 5: Set-up assist to: Adjust water temp  FIM - Upper Body Dressing/Undressing Upper body dressing/undressing steps patient completed: Thread/unthread right sleeve of pullover shirt/dresss;Thread/unthread left sleeve of pullover shirt/dress;Put head through opening of pull over shirt/dress;Pull shirt over trunk Upper body dressing/undressing: 5: Set-up assist to: Obtain clothing/put away FIM - Lower Body Dressing/Undressing Lower body dressing/undressing steps patient completed: Pull pants up/down;Pull underwear up/down Lower body dressing/undressing: 4: Steadying Assist  FIM - Toileting Toileting steps completed by patient: Adjust clothing prior to toileting;Performs perineal hygiene;Adjust clothing after toileting Toileting Assistive Devices: Grab bar or rail for support Toileting: 5: Set-up assist to: Obtain supplies  FIM - Diplomatic Services operational officer Devices: Bedside commode Toilet Transfers: 5-To toilet/BSC: Supervision (verbal cues/safety issues) Toilet Transfers DO NOT USE: 3-From toilet/BSC: Mod A (lift or lower assist)  FIM - Banker Devices: Bed rails Bed/Chair Transfer: 4: Chair or W/C > Bed: Min A (steadying Pt. > 75%);3: Bed > Chair or W/C: Mod A (lift or lower assist)  FIM - Locomotion: Wheelchair Distance: 30 Locomotion: Wheelchair: 2: Travels 50 - 149 ft with supervision, cueing or coaxing FIM - Locomotion: Ambulation Locomotion: Ambulation Assistive Devices: Parallel bars Ambulation/Gait Assistance: 1: +1 Total assist Locomotion: Ambulation: 1: Travels less than 50 ft with moderate assistance (Pt: 50 - 74%)  Comprehension Comprehension Mode: Auditory Comprehension: 4-Understands basic 75 - 89% of the time/requires cueing 10 - 24% of the time  Expression Expression Mode: Verbal Expression: 4-Expresses basic 75 - 89%  of the time/requires cueing 10 - 24% of the time.  Needs helper to occlude trach/needs to repeat words.  Social Interaction Social Interaction Mode:  (required tactile prompts to wake up and participate) Social Interaction: 4-Interacts appropriately 75 - 89% of the time - Needs redirection for appropriate language or to initiate interaction.  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 DVT Prophylaxis/Anticoagulation: Subcutaneous heparin stopped   -- platelets are improving.  3. Seizure disorder. Depakote decreased.  Check level again in AM 3/6  4. Renal failure/MPGN. Mgt per [employer]. Weigh patient daily.   -outpt f/u per [facility]  -check labs next week  Lab Results  Component Value Date   CREATININE 2.82* 05/25/2011    5. Hypertension. Norvasc 10 mg daily, clonidine changed to q8 pill, Isordil 10 mg 3 times daily, Lopressor twice a day, minoxidil 10 mg daily.  -bp's less labile currently and generally improved BP Readings from Last 3 Encounters:  05/27/11 136/92  05/08/11 134/80  04/28/11 150/88    6. Anemia. Continue iron supplement. Followup CBC stable 7. Persistent vague foot pain.  Appears to neuropathic.  Also has myoclonus.  Tramadol trial had helped, but we stopped due "tongue swelling" and fatigue  -i suspect a large part of this is behavioral as well. 8. Leukocytosis- WBC's improved.  She's afebrile  -avelox stopped  -recheck cxr improved  -blood cultures negative. 9.- Excessive sleepiness.- THIS IS LARGELY BEHAVIORAL.  DISCUSSED AT LENGTH WITH PATIENT AND MOTHER (WHO AGREES)  -discussed motivation and working through her problems in order to get home.  -klonopin adjusted down.  -added ritalin which has really helped- i will not send her home on ritalin however  -don't see value in  a neuropsych consult as she doesn't have the cognitive awareness to really participate in this. Motivation seems to be improved over the last few days.  10. FEN-added megace with benefit.  -food from home  -eating better as a whole  -off ivf, see how she maintains on her own 31. Thrombocytopenia-  -sq heparin stopped. avelox stopped also  -valproic acid- will decreased. Will recheck level in the am.  -platelets showing some improvement  -platelets rebounding to 159.  -recheck labs at home next week.  LOS (Days) 19 A FACE TO FACE EVALUATION WAS PERFORMED  Ralyn Stlaurent T 05/27/2011, 6:41 AM

## 2011-05-27 NOTE — Patient Care Conference (Signed)
Inpatient RehabilitationTeam Conference Note Date: 05/26/2011   Time: 2:53 PM    Patient Name: Heather Sims      Medical Record Number: [id number]  Date of Birth: February 15, 1991 Sex: Female         Room/Bed: 4029/4029-01 Payor Info: Payor:     Admitting Diagnosis: ANOXIC BI  Admit Date/Time:  05/08/2011  4:36 PM Admission Comments: No comment available   Primary Diagnosis:  <principal problem not specified> Principal Problem: <principal problem not specified>  Patient Active Problem List  Diagnoses Date Noted  . Anoxic brain damage 05/12/2011  . Seizures 05/06/2011  . Hypertension 04/07/2011  . ? Viral Cardiomyopathy 04/07/2011  . AKI (acute kidney injury) 03/26/2011  . Acute heart failure 03/26/2011  . Hypoxemia 03/26/2011  . Pneumonia 03/26/2011  . Anemia 03/26/2011    Expected Discharge Date: Expected Discharge Date: 05/27/11  Team Members Present: Physician: Dr. Faith Rogue Case Manager Present: Melanee Spry, RN Social Worker Present: Amada Jupiter, LCSW Nurse Present: Carmie End, RN PT Present: Reggy Eye, PT OT Present: Roney Mans, OT;Ardis Rowan, Corky Crafts, OT SLP Present: Feliberto Gottron, SLP Other (Discipline and Name): Tora Duck, PPS Coordinator     Current Status/Progress Goal Weekly Team Focus  Medical   Improved pulmonary status. Arousal improved. A large behavioral component also noted. Patient's dietary intake better         Bowel/Bladder   continent with toileting  every 3 hrs. by patient's mother  continent of bowel   contient of bowel and bladder  up to bsc for toileting   Swallow/Nutrition/ Hydration             ADL's   min A  min A  family education   Mobility   mod A  mod A  family ed   Communication             Safety/Cognition/ Behavioral Observations            Pain   no complaints  <2  Monitor s/s of pain   Skin   Intact  Intact       Rehab Goals Rehab Goals Revised: Monitor *See Interdisciplinary Assessment and  Plan and progress notes for long and short-term goals  Barriers to Discharge: Behavioral issues    Possible Resolutions to Barriers:  It behavioral modification/Ritalin trial has been helpful    Discharge Planning/Teaching Needs:  home with parents - mother to provide 24/7 assistance - education being completed      Team Discussion:  Ready for d/c.   Revisions to Treatment Plan: none    Continued Need for Acute Rehabilitation Level of Care: The patient requires daily medical management by a physician with specialized training in physical medicine and rehabilitation for the following conditions: Daily direction of a multidisciplinary physical rehabilitation program to ensure safe treatment while eliciting the highest outcome that is of practical value to the patient.: Yes Daily medical management of patient stability for increased activity during participation in an intensive rehabilitation regime.: Yes Daily analysis of laboratory values and/or radiology reports with any subsequent need for medication adjustment of medical intervention for : Neurological problems;Other  Brock Ra 05/27/2011, 10:53 AM

## 2011-05-27 NOTE — Discharge Summary (Signed)
Heather Sims, Heather Sims NO.:  [id number]  MEDICAL RECORD NO.:  [id number]  LOCATION:  4029                         FACILITY:  [facility]  PHYSICIAN:  Ranelle Oyster, M.D.DATE OF BIRTH:  06-21-1990  DATE OF ADMISSION:  05/08/2011 DATE OF DISCHARGE:  05/27/2011                              DISCHARGE SUMMARY   DISCHARGE DIAGNOSES: 1. 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 2. Subcutaneous heparin for deep vein thrombosis prophylaxis. 3. Seizure disorder. 4. Renal failure. 5. Hypertension. 6. Anemia. 7. Thrombocytopenia - improved. 8. Mood - behavior.  This is a 21 year old right-handed [place] American female with recent diagnosis of hypertension as well as upper respiratory infection for which she had been placed on Avelox.  Admitted on March 26, 2011,  with sudden onset of shortness of breath, cardiorespiratory failure with CPR in the field.  She was pulseless for approximately 5 minutes, intubated in the emergency department.  Underwent hypothermia protocol, suffered seizures upon rewarming.  Cranial CT scan.  MRI of the brain showed no acute changes.  Echocardiogram with ejection fraction 40%.  Systolic function moderately reduced.  TEE with diffuse hypokinesis.  Ejection fraction 45%.  Venous Doppler studies in lower extremities negative. EEG with minimal seizure activity.  MRI of cervical, lumbar, thoracic spine unremarkable.  Noted hemoglobin 8.1, creatinine 2.31 upon admission.  She was transfused 3 units of packed red blood cells.  Renal ultrasound was negative for hydronephrosis.  Renal Services consulted, felt insufficiency due to hypertension.  Renal biopsy completed to complete workup for acute renal insufficiency with results pending. Postoperative biopsy with perirenal hematoma, close monitoring with CBC that remained stable.  Latest creatinine 1.95 stabilized.  High-dose Lasix had been discontinued with stabilizing renal function  April 12, 2011.  Placed on subcutaneous heparin for deep vein thrombosis prophylaxis.  Her heparin at that time was discontinued after perirenal hematoma after renal biopsy.  Placed on Depakote 1000 mg every 12 hours per Neurology Services for seizure disorder.  No further seizure activity noted, however, continued to have bouts of myoclonus of lower extremities improved after being placed on Klonopin.  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  The patient ultimately admitted to inpatient Rehab Services April 13, 2011.  Ongoing follow up in regard to her acute renal failure as well as her malignant hypertension.  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  Her creatinine gradually continued to worsen. Her Lasix had been discontinued.  With followup Renal Services, felt the patient should be transferred to [facility] for expert opinions of Dr. Bonnell Public who is an expert in the field of glomerular disease.  She was transferred to [facility] April 28, 2011.  At [facility], she was given a fluid challenge of 2 L over 24 hours, which the patient made adequate urine output per her records.  She did receive renal sonogram that showed no structural uropathy and a small right perinephric hematoma compatible with postrenal biopsy.  Echocardiogram again unremarkable. The patient's Neurontin was discontinued secondary to the sedation and monitored that she had been on for neuropathy.  Her latest creatinine at [facility] was 2.9, felt to be stabilized.  She  was transferred back to [facility].  No further workup indicated at that time.  May 06, 2011, she was transferred back to [facility] and ultimately to Rehab Services for ongoing intensive rehab program.  PAST MEDICAL HISTORY:  See discharge diagnoses.  ALLERGIES:  None.  SOCIAL HISTORY:  Lives with family.  Assistance as needed.  FUNCTIONAL STATUS UPON ADMISSION  TO REHABILITATION SERVICES:  Moderate assist sit to stand, max assist stand pivot transfers, +1 total assist ambulate 4 feet with a rolling walker.  PHYSICAL EXAMINATION:  VITAL SIGNS:  Blood pressure 133/81, pulse 85, temperature 98, respirations 18. GENERAL:  This is a well-developed female, normocephalic. LUNGS:  Clear to auscultation. CARDIAC:  Rate controlled. ABDOMEN:  Soft, nontender.  Good bowel sounds. NEUROLOGIC:  She was lethargic, but arousable.  She would state her name.  It was difficult to keep attention for tasks.  During the exam, she followed one-step commands, but was inconsistent.  Reflexes were hyperactive at 3+.  She was easily distracted.  REHABILITATION HOSPITAL COURSE:  The patient was admitted to inpatient rehab services with therapies initiated on a 3 hour daily basis consisting of physical therapy, occupational therapy, and rehabilitation nursing.  The following issues were addressed during the patient's rehabilitation stay.  Pertaining to Ms. Quesnell's anoxic brain injury after cardiopulmonary arrest she did continue to improve.  She was placed on trials of Ritalin that were later discontinued.  Her participation with therapies were somewhat variable, but did become more consistent later on during her hospital rehab stay.  She remained on Klonopin for myoclonus with good results.  Her subcutaneous heparin had been discontinued due to some thrombocytopenia as well as Avelox she had been on for suspect pneumonia with latest chest x-ray improved. Hypertension followed closely by Renal Services on multiple antihypertensive medications.  They were adjusted accordingly for cost deficiency, and these changes were addressed with Renal Services that she would remain on with plan to follow up with Dr. Marina Gravel of Renal Services.  She remained on Depakote for seizure disorder, latest valproic level 93.9.  Her Depakote was decreased to 500 mg every 8 hours.  She  showed no seizure activity during her rehab stay.  Anemia, CBC stable.  Follow up blood count to be done as an outpatient. Persistent vague foot pain appeared to be neuropathic.  She had been on Neurontin.  This had been discontinued due to sedation and excessive sleepiness, which essentially was largely felt to be behavioral in nature.  This was all discussed with her mother who did agree with this. No plan for neuropsych consult at this time due to her cognitive awareness to participate.  The patient received weekly collaborative interdisciplinary team conferences to discuss estimated length of stay, family teaching, and any barriers to her discharge.  She received routine toileting, occasional incontinence of bladder.  She is doing well with a dysphagia III thin liquid diet.  She was moderate to max assist for activities of daily living.  However, this was variable as well as with her overall mobility.  Her mother and family had been through full family teaching who could provide the necessary 24-hour care at home with ongoing therapies dictated as per [employer].  It was discussed at length again with family the need for ongoing assistance for her safety.  Her comprehension did continue to improve. She was able to understand basic questions 75% to 89% of the time, requiring some cues.  She was able to express herself, recall  50% to 74% of the time.  Again, this was variable.  Latest labs showed a hemoglobin of 11.6, hematocrit 36.3, platelets improved to 159,000, WBC of 10.  Latest valproic acid level 96.8.  Her Depakote had been decreased.  Sodium 141, potassium 3.8, BUN 61, creatinine 2.82.  This did remain relatively stable.  Throughout her rehab course, there was some discussion by Renal Services, possible need for outpatient dialysis, did not feel she was a candidate at this time, and would be acknowledged with Dr. Marina Gravel, outpatient.  DISCHARGE MEDICATIONS AT TIME OF  DICTATION: 1. Norvasc 10 mg daily. 2. Os-Cal 500 mg 1 tablet twice daily. 3. Klonopin 0.25 mg at bedtime. 4. Clonidine patch 0.3 mg, change every Tuesday. 5. Depakote 500 mg every 8 hours. 6. Lasix 160 mg twice daily. 7. Imdur 60 mg daily. 8. Lisinopril 10 mg twice daily. 9. Metoprolol 25 mg twice daily. 10.Minoxidil 5 mg twice daily. 11.Tylenol as needed.  DIET:  Mechanical soft.  SPECIAL INSTRUCTIONS:  The patient would follow up with Dr. Faith Rogue at the outpatient rehab service office as the appointment had been made at Dr. Marina Gravel, Renal Services on 04/17 at 3:00 p.m. Arrangements were made for primary care provider with [facility] on June 05, 2011, again at 3:00 p.m.  Ongoing therapies were arranged as per [employer].     Mariam Dollar, P.A.   ______________________________ Ranelle Oyster, M.D.    DA/MEDQ  D:  05/27/2011  T:  05/27/2011  Job:  [id number]  cc:   Wilber Bihari. Caryn Section, M.D.

## 2011-05-27 NOTE — Discharge Instructions (Signed)
Inpatient Rehab Discharge Instructions  Naliyah Neth Discharge date and time: No discharge date for patient encounter.   Activities/Precautions/ Functional Status: Activity: activity as tolerated Diet: regular diet Wound Care: none needed Functional status:  ___ No restrictions     ___ Walk up steps independently __x_ 24/7 supervision/assistance   _x__ Walk up steps with assistance ___ Intermittent supervision/assistance  ___ Bathe/dress independently ___ Walk with walker     ___ Bathe/dress with assistance ___ Walk Independently    ___ Shower independently _x__ Walk with assistance    __x_ Shower with assistance ___ No alcohol     ___ Return to work/school ________   COMMUNITY REFERRALS UPON DISCHARGE:    Home Health:   PT    OT     ST    RN    SW                  Agency: [facility] Phone: [phone]    Medical Equipment/Items Ordered: wheelchair, cushion, walker, drop arm commode and tub bench                                                    Agency/Supplier: [facility] [phone]  Other: Referral made to [facility] for medical follow up and medication assistance (see above for appt. Info)   GENERAL COMMUNITY RESOURCES FOR PATIENT/FAMILY:  Support Groups: [place] [facility]    Caregiver Support: Same     Special Instructions: Followup renal services Dr. Marina Gravel [phone] April 17 at 3 PM   My questions have been answered and I understand these instructions. I will adhere to these goals and the provided educational materials after my discharge from the hospital.  Patient/Caregiver Signature _______________________________ Date __________  Clinician Signature _______________________________________ Date __________  Please bring this form and your medication list with you to all your follow-up doctor's appointments.

## 2011-05-27 NOTE — Progress Notes (Signed)
Social Work  Discharge Note  The overall goal for the admission was met for:   Discharge location: Yes - home with parents and mother to be primary caregiver  Length of Stay: Yes  Discharge activity level: Yes - min assist overall  Home/community participation: Yes  Services provided included: MD, RD, PT, OT, SLP, RN, CM, TR, Pharmacy and SW  Financial Services: Other: Medicaid and SSD applications pending  Follow-up services arranged: Home Health: RN,PT,OT,ST and SW via [facility], DME: [employer] wc, cushion, drop arm commode and tub bench - [facility], Other: Referred to [facility], [facility] and local support group and Patient/Family has no preference for HH/DME agencies  Comments (or additional information): Assisted with meds via [facility] account and [employer] generic program  Patient/Family verbalized understanding of follow-up arrangements: Yes  Individual responsible for coordination of the follow-up plan:  mother  Confirmed correct DME delivered: Amada Jupiter 05/27/2011    Anel Purohit

## 2011-06-04 ENCOUNTER — Other Ambulatory Visit: Payer: Self-pay

## 2011-06-04 ENCOUNTER — Emergency Department (HOSPITAL_COMMUNITY)
Admission: EM | Admit: 2011-06-04 | Discharge: 2011-06-04 | Disposition: A | Discharge status: Home / Self Care | Payer: Medicaid Other | Attending: Emergency Medicine | Admitting: Emergency Medicine

## 2011-06-04 ENCOUNTER — Encounter (HOSPITAL_COMMUNITY): Payer: Self-pay | Admitting: Emergency Medicine

## 2011-06-04 DIAGNOSIS — R259 Unspecified abnormal involuntary movements: Secondary | ICD-10-CM | POA: Insufficient documentation

## 2011-06-04 DIAGNOSIS — G931 Anoxic brain damage, not elsewhere classified: Secondary | ICD-10-CM | POA: Insufficient documentation

## 2011-06-04 DIAGNOSIS — Z79899 Other long term (current) drug therapy: Secondary | ICD-10-CM | POA: Insufficient documentation

## 2011-06-04 DIAGNOSIS — I10 Essential (primary) hypertension: Secondary | ICD-10-CM | POA: Insufficient documentation

## 2011-06-04 DIAGNOSIS — R5381 Other malaise: Secondary | ICD-10-CM | POA: Insufficient documentation

## 2011-06-04 DIAGNOSIS — N289 Disorder of kidney and ureter, unspecified: Secondary | ICD-10-CM | POA: Insufficient documentation

## 2011-06-04 LAB — BASIC METABOLIC PANEL
CO2: 33 mEq/L — ABNORMAL HIGH (ref 19–32)
Calcium: 9.5 mg/dL (ref 8.4–10.5)
Creatinine, Ser: 2.41 mg/dL — ABNORMAL HIGH (ref 0.50–1.10)
GFR calc non Af Amer: 27 mL/min — ABNORMAL LOW (ref >90)
Glucose, Bld: 94 mg/dL (ref 70–99)

## 2011-06-04 LAB — POCT PREGNANCY, URINE: Preg Test, Ur: NEGATIVE

## 2011-06-04 LAB — CBC
MCH: 32 pg (ref 26.0–34.0)
MCHC: 33 g/dL (ref 30.0–36.0)
MCV: 97 fL (ref 78.0–100.0)
Platelets: 118 10*3/uL — ABNORMAL LOW (ref 150–400)
RDW: 17.2 % — ABNORMAL HIGH (ref 11.5–15.5)

## 2011-06-04 LAB — DIFFERENTIAL
Basophils Absolute: 0 10*3/uL (ref 0.0–0.1)
Eosinophils Absolute: 0.1 10*3/uL (ref 0.0–0.7)
Lymphs Abs: 2.4 10*3/uL (ref 0.7–4.0)
Monocytes Absolute: 0.9 10*3/uL (ref 0.1–1.0)
Neutrophils Relative %: 56 % (ref 43–77)

## 2011-06-04 MED ORDER — METOPROLOL TARTRATE 25 MG PO TABS: ORAL_TABLET | ORAL | Status: DC

## 2011-06-04 MED ORDER — LISINOPRIL 10 MG PO TABS: 20.0000 mg | ORAL_TABLET | Freq: Two times a day (BID) | ORAL | Status: DC

## 2011-06-04 NOTE — ED Notes (Signed)
Per family member, pt with high blood pressure-not able to control with current meds

## 2011-06-04 NOTE — Progress Notes (Signed)
ED CM noted Cm consult for Home health services Spoke with pt and her family. Mother states pt remains active with [facility] and she has their contact information . No interventions needed from ED CM.   ED CM updated ED RN that an order to resume home health services needed from EDP only, no other interventions needed.

## 2011-06-04 NOTE — Discharge Instructions (Signed)
Medical conditions can also worsen, so it is also important to return immediately as directed below, or if you have other serious concerns develop. RETURN IMMEDIATELY IF you develop new shortness of breath, chest pain, fever, have difficulty moving parts of your body (new weakness, numbness, or incoordination), sudden change in speech, vision, swallowing, or understanding, faint or develop new dizziness, severe headache, become poorly responsive or have an altered mental status compared to baseline for you, new rash, abdominal pain, or bloody stools,  Return sooner also if you develop new problems for which you have not talked to your caregiver but you feel may be emergency medical conditions, or are unable to be cared for safely at home.  Increase your metoprolol from 25mg  twice daily to 50mg  twice daily, and your lisinopril from 10mg  twice daily to 20mg  twice daily until recheck at Nephrology next week.

## 2011-06-04 NOTE — ED Notes (Signed)
MD at bedside. 

## 2011-06-04 NOTE — ED Provider Notes (Signed)
History     CSN: [id number]  Arrival date & time 06/04/11  1418   First MD Initiated Contact with Patient 06/04/11 1519      Chief Complaint  Patient presents with  . 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 During rehabilitation she was able to recover some ability to walk talk and follow commands. Her ability to swallow as much improved. Her ability to talk is much improved. She now understands almost all commands. She is able to walk now with minimal assistance and able to walk with a walker at times which is better than when she was discharged as well. She has no headache no change in mental status now no chest pain no shortness of breath no new focal neurologic deficits. She did have seizures and myoclonic jerks in the hospital as well. She has no recent seizures but continues to have myoclonic jerks of her lower extremities which is now baseline for her. She has generalized weakness and incoordination which is gradually improving as well. Patient and her mother are pleased with her progressive improvement but when a visiting nurse checked the patient's blood pressure today was elevated again and so the patient was sent to the ED for evaluation. The patient did have hypertension and renal insufficiency when she was hospitalized. She has not seen her primary care doctor at [facility] yet and has an intake evaluation there tomorrow to try to get a new doctor.  Her current doctor is [facility] for her BP meds. Past Medical History  Diagnosis Date  . Pneumonia last 2 weeks    'walking pneumonia'  . Seizures   . Chronic kidney disease   . Anemia   . Heart attack   . Hypertension   . Renal disorder     Past Surgical History  Procedure Date  . Renal biopsy      History reviewed. No pertinent family history.  History  Substance Use Topics  . Smoking status: Not on file  . Smokeless tobacco: Not on file  . Alcohol Use: Yes    OB History    Grav Para Term Preterm Abortions TAB SAB Ect Mult Living                  Review of Systems  Constitutional: Negative for fever.       10 Systems reviewed and are negative for acute change except as noted in the HPI.  HENT: Negative for congestion.   Eyes: Negative for discharge and redness.  Respiratory: Negative for cough and shortness of breath.   Cardiovascular: Negative for chest pain.  Gastrointestinal: Negative for vomiting and abdominal pain.  Musculoskeletal: Negative for back pain.  Skin: Negative for rash.  Neurological: Positive for weakness. Negative for syncope, numbness and headaches.  Psychiatric/Behavioral:       No behavior change.    Allergies  Review of patient's allergies indicates no known allergies.  Home Medications   Current Outpatient Rx  Name Route Sig Dispense Refill  . AMLODIPINE BESYLATE 10 MG PO TABS Oral Take 1 tablet (10 mg total) by mouth daily. 30 tablet 1  . CALCIUM CARBONATE-VITAMIN D 500-200 MG-UNIT PO TABS Oral Take 1 tablet by mouth 2 (two) times daily. 60 tablet 1  . CHOLECALCIFEROL 400 UNITS PO TABS Oral Take 1 tablet (400 Units total) by mouth 2 (two) times daily. 60 each 1  .  CLONAZEPAM 0.5 MG PO TABS Oral Take 0.5 mg by mouth at bedtime.    Marland Kitchen CLONIDINE HCL 0.3 MG/24HR TD PTWK Transdermal Place 1 patch (0.3 mg total) onto the skin once a week. On tuesdays 4 patch 1  . DIVALPROEX SODIUM 500 MG PO TBEC Oral Take 1 tablet (500 mg total) by mouth 3 (three) times daily. 90 tablet 1  . FERROUS SULFATE 325 (65 FE) MG PO TABS Oral Take 1 tablet (325 mg total) by mouth 3 (three) times daily. 90 tablet 1  . FUROSEMIDE 80 MG PO TABS Oral Take 2 tablets (160 mg total) by mouth 2 (two) times daily. 60 tablet 1  . ISOSORBIDE MONONITRATE ER 60 MG PO TB24 Oral  Take 1 tablet (60 mg total) by mouth daily. 30 tablet 1  . MINOXIDIL 10 MG PO TABS Oral Take 0.5 tablets (5 mg total) by mouth 2 (two) times daily. 60 tablet 1  . AMLODIPINE BESYLATE 10 MG PO TABS Oral Take 10 mg by mouth daily.    Marland Kitchen CALCIUM CARBONATE-VITAMIN D 500-200 MG-UNIT PO TABS Oral Take 1 tablet by mouth 2 (two) times daily.    . CHOLECALCIFEROL 400 UNITS PO TABS Oral Take 400 Units by mouth 2 (two) times daily.    Marland Kitchen CLONAZEPAM 0.5 MG PO TABS Oral Take 0.25 mg by mouth at bedtime as needed. For sleep    . CLONIDINE HCL 0.3 MG/24HR TD PTWK Transdermal Place 1 patch onto the skin once a week. tuesdays    . DIVALPROEX SODIUM 500 MG PO TBEC Oral Take 500 mg by mouth 3 (three) times daily.    Marland Kitchen FERROUS SULFATE 325 (65 FE) MG PO TABS Oral Take 325 mg by mouth 3 (three) times daily.    . FUROSEMIDE 80 MG PO TABS Oral Take 160 mg by mouth 2 (two) times daily.    . ISOSORBIDE MONONITRATE ER 60 MG PO TB24 Oral Take 60 mg by mouth daily.    Marland Kitchen LISINOPRIL 10 MG PO TABS Oral Take 2 tablets (20 mg total) by mouth 2 (two) times daily. 60 tablet 1  . LISINOPRIL 10 MG PO TABS Oral Take 20 mg by mouth 2 (two) times daily.    Marland Kitchen METOPROLOL TARTRATE 25 MG PO TABS  Two pills twice daily 60 tablet 1  . METOPROLOL TARTRATE 25 MG PO TABS Oral Take 25 mg by mouth 2 (two) times daily.    Marland Kitchen MINOXIDIL 10 MG PO TABS Oral Take 5 mg by mouth 2 (two) times daily.      BP 167/106  Pulse 108  Temp(Src) 99.4 F (37.4 C) (Oral)  Resp 20  SpO2 98%  LMP 06/02/2011  Physical Exam  Nursing note and vitals reviewed. Constitutional:       Awake, alert, nontoxic appearance with baseline speech for patient.  HENT:  Head: Atraumatic.  Mouth/Throat: No oropharyngeal exudate.  Eyes: EOM are normal. Pupils are equal, round, and reactive to light. Right eye exhibits no discharge. Left eye exhibits no discharge.  Neck: Neck supple.  Cardiovascular: Normal rate and regular rhythm.   No murmur heard. Pulmonary/Chest: Effort  normal and breath sounds normal. No stridor. No respiratory distress. She has no wheezes. She has no rales. She exhibits no tenderness.  Abdominal: Soft. Bowel sounds are normal. She exhibits no mass. There is no tenderness. There is no rebound.  Musculoskeletal: She exhibits no tenderness.       Baseline ROM, moves extremities with no obvious new focal weakness.  Lymphadenopathy:    She has no cervical adenopathy.  Neurological: She is alert.       Awake, alert, cooperative and aware of situation, oriented to person and place; motor strength bilaterally 4/5 symmetric; sensation normal to light touch bilaterally; peripheral visual fields full to confrontation; no facial asymmetry; tongue midline; major cranial nerves appear intact; no pronator drift, almost normal finger to nose bilaterally symmetric with mild incoordination, some myoclonic jerking and tremors and incoordination of her legs  Skin: No rash noted.  Psychiatric: She has a normal mood and affect.    ED Course  Procedures (including critical care time) ECG:  Sinus tachycardia, ventricular rate 111, normal axis, normal intervals, nonspecific inferolateral ST-T changes with no comparison ECG available  Renal recs increase lisinopril and metoprolol. Labs Reviewed  BASIC METABOLIC PANEL - Abnormal; Notable for the following:    CO2 33 (*)    BUN 68 (*)    Creatinine, Ser 2.41 (*)    GFR calc non Af Amer 27 (*)    GFR calc Af Amer 32 (*)    All other components within normal limits  CBC - Abnormal; Notable for the following:    RDW 17.2 (*)    Platelets 118 (*)    All other components within normal limits  DIFFERENTIAL  VALPROIC ACID LEVEL  POCT I-STAT TROPONIN I  POCT PREGNANCY, URINE  LAB REPORT - SCANNED   No results found.   1. Hypertension   2. Anoxic encephalopathy   3. Renal insufficiency       MDM  Pt stable in ED with no significant deterioration in condition.Patient / Family / Caregiver informed of  clinical course, understand medical decision-making process, and agree with plan.        Hurman Horn, MD 06/06/11 [phone]

## 2011-06-05 ENCOUNTER — Emergency Department (HOSPITAL_COMMUNITY)
Admission: EM | Admit: 2011-06-05 | Discharge: 2011-06-05 | Disposition: A | Discharge status: Home / Self Care | Payer: Medicaid Other | Attending: Emergency Medicine | Admitting: Emergency Medicine

## 2011-06-05 ENCOUNTER — Encounter (HOSPITAL_COMMUNITY): Payer: Self-pay | Admitting: Emergency Medicine

## 2011-06-05 DIAGNOSIS — Z79899 Other long term (current) drug therapy: Secondary | ICD-10-CM | POA: Insufficient documentation

## 2011-06-05 DIAGNOSIS — I10 Essential (primary) hypertension: Secondary | ICD-10-CM

## 2011-06-05 DIAGNOSIS — N289 Disorder of kidney and ureter, unspecified: Secondary | ICD-10-CM | POA: Insufficient documentation

## 2011-06-05 HISTORY — DX: Disorder of kidney and ureter, unspecified: N28.9

## 2011-06-05 MED ORDER — CLONIDINE HCL 0.1 MG PO TABS
0.1000 mg | ORAL_TABLET | Freq: Once | ORAL | Status: AC
  Administered 2011-06-05: 0.1 mg via ORAL
  Filled 2011-06-05: qty 1

## 2011-06-05 NOTE — ED Notes (Signed)
Patient's Mother very verbal about patient being discharged.  Dr. Patrecia Pace talked with both regarding patient's BP meds and time for them to work more appropriately.

## 2011-06-05 NOTE — ED Provider Notes (Signed)
History     CSN: [id number]  Arrival date & time 06/05/11  0203   First MD Initiated Contact with Patient 06/05/11 0354      Chief Complaint  Patient presents with  . Hypertension    (Consider location/radiation/quality/duration/timing/severity/associated sxs/prior treatment) HPI Comments: Patient presents today with her mother for her high blood pressure.  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  The mother notes that the patient was in the hospital for approximately 2 months after she had cardiac arrest.  Patient was found to have kidney disease and hypertension.  Patient has been receiving her medications at home but when the home health nurse came today she noted blood pressures that were significantly elevated and directed them to the emergency department.  They were initially seen in the [facility] emergency department where per the note the physician there did contact the nephrologist on call who recommended increases in the metoprolol and lisinopril dosing.  The mother states that she did give her an extra dose this evening but when she rechecked her blood pressure was still in the 180s and so she brought her here to the [facility] emergency department.  Patient is otherwise had no other new changes in behavior or medical issues at this time.  Patient is a 21 y.o. female presenting with hypertension. The history is provided by a relative. The history is limited by the condition of the patient. No language interpreter was used.  Hypertension This is a chronic problem. Pertinent negatives include no chest pain, no abdominal pain, no headaches and no shortness of breath.    Past Medical History  Diagnosis Date  . Hypertension   . Renal disorder     History reviewed. No pertinent past surgical history.  No family history on file.  History  Substance Use Topics  . Smoking status: Not on file  . Smokeless tobacco: Not on file  . Alcohol  Use: Yes    OB History    Grav Para Term Preterm Abortions TAB SAB Ect Mult Living                  Review of Systems  Constitutional: Negative.  Negative for fever and chills.  HENT: Negative.   Eyes: Negative.  Negative for discharge and redness.  Respiratory: Negative.  Negative for cough and shortness of breath.   Cardiovascular: Negative.  Negative for chest pain.  Gastrointestinal: Negative.  Negative for nausea, vomiting, abdominal pain and diarrhea.  Genitourinary: Negative.  Negative for dysuria and vaginal discharge.  Musculoskeletal: Negative.  Negative for back pain.  Skin: Negative.  Negative for color change and rash.  Neurological: Negative.  Negative for syncope and headaches.  Hematological: Negative.  Negative for adenopathy.  Psychiatric/Behavioral: Negative.  Negative for confusion.  All other systems reviewed and are negative.    Allergies  Review of patient's allergies indicates no known allergies.  Home Medications   Current Outpatient Rx  Name Route Sig Dispense Refill  . AMLODIPINE BESYLATE 10 MG PO TABS Oral Take 10 mg by mouth daily.    Marland Kitchen CALCIUM CARBONATE-VITAMIN D 500-200 MG-UNIT PO TABS Oral Take 1 tablet by mouth 2 (two) times daily.    . CHOLECALCIFEROL 400 UNITS PO TABS Oral Take 400 Units by mouth 2 (two) times daily.    Marland Kitchen CLONAZEPAM 0.5 MG PO TABS Oral Take 0.25 mg by mouth at bedtime as needed. For sleep    . CLONIDINE HCL 0.3  MG/24HR TD PTWK Transdermal Place 1 patch onto the skin once a week. tuesdays    . DIVALPROEX SODIUM 500 MG PO TBEC Oral Take 500 mg by mouth 3 (three) times daily.    Marland Kitchen FERROUS SULFATE 325 (65 FE) MG PO TABS Oral Take 325 mg by mouth 3 (three) times daily.    . FUROSEMIDE 80 MG PO TABS Oral Take 160 mg by mouth 2 (two) times daily.    . ISOSORBIDE MONONITRATE ER 60 MG PO TB24 Oral Take 60 mg by mouth daily.    Marland Kitchen LISINOPRIL 10 MG PO TABS Oral Take 20 mg by mouth 2 (two) times daily.    Marland Kitchen METOPROLOL TARTRATE 25 MG PO  TABS Oral Take 25 mg by mouth 2 (two) times daily.    Marland Kitchen MINOXIDIL 10 MG PO TABS Oral Take 5 mg by mouth 2 (two) times daily.      BP 170/123  Pulse 104  Temp(Src) 99.1 F (37.3 C) (Oral)  Resp 20  SpO2 96%  LMP 06/04/2011  Physical Exam  Nursing note and vitals reviewed. Constitutional: She appears well-developed and well-nourished.  Non-toxic appearance. She does not have a sickly appearance.  HENT:  Head: Normocephalic and atraumatic.  Eyes: Conjunctivae, EOM and lids are normal. Pupils are equal, round, and reactive to light. No scleral icterus.  Neck: Trachea normal and normal range of motion. Neck supple.  Cardiovascular: Normal rate, regular rhythm and normal heart sounds.   Pulmonary/Chest: Effort normal and breath sounds normal. No respiratory distress. She has no wheezes. She has no rales.  Abdominal: Soft. Normal appearance. There is no tenderness. There is no rebound, no guarding and no CVA tenderness.  Musculoskeletal: Normal range of motion.  Neurological: She is alert. She has normal strength.  Skin: Skin is warm, dry and intact. No rash noted.  Psychiatric:       Difficult to assess due to patient's brain injury    ED Course  Procedures (including critical care time)  Labs Reviewed - No data to display No results found.   No diagnosis found.    MDM  Patient was seen in the [facility] emergency department earlier and had increases in her blood pressure medicine completed.  Patient presents now for persistent elevated blood pressure.  I did contact the nephrologist on call who recommends the patient continue with the doubling of her metoprolol and lisinopril doses and followup next week as directed.  She does not believe the patient warrants admission for further blood pressure control this time.        Nat Christen, MD 06/05/11 [phone]

## 2011-06-05 NOTE — ED Notes (Signed)
Patient's Mother in room now.

## 2011-06-05 NOTE — ED Notes (Signed)
Patient with Stage 3 Renal--went to [facility] 3/14 for HTN and was told to increase Lisinopril.  Went home and is now here for continued HTN.  Has received sleeping medication.

## 2011-06-05 NOTE — Discharge Instructions (Signed)
Please followup with Dr. Caryn Section next week in the office for blood pressure recheck.  Please continue with your increased doses of metoprolol and lisinopril as previously directed.  Hypertension Information As your heart beats, it forces blood through your arteries. This force is your blood pressure. If the pressure is too high, it is called hypertension (HTN) or high blood pressure. HTN is dangerous because you may have it and not know it. High blood pressure may mean that your heart has to work harder to pump blood. Your arteries may be narrow or stiff. The extra work puts you at risk for heart disease, stroke, and other problems.  Blood pressure consists of two numbers, a higher number over a lower, 110/72, for example. It is stated as "110 over 72." The ideal is below 120 for the top number (systolic) and under 80 for the bottom (diastolic).  You should pay close attention to your blood pressure if you have certain conditions such as:  Heart failure.   Prior heart attack.   Diabetes   Chronic kidney disease.   Prior stroke.   Multiple risk factors for heart disease.  To see if you have HTN, your blood pressure should be measured while you are seated with your arm held at the level of the heart. It should be measured at least twice. A one-time elevated blood pressure reading (especially in the Emergency Department) does not mean that you need treatment. There may be conditions in which the blood pressure is different between your right and left arms. It is important to see your caregiver soon for a recheck. Most people have essential hypertension which means that there is not a specific cause. This type of high blood pressure may be lowered by changing lifestyle factors such as:  Stress.   Smoking.   Lack of exercise.   Excessive weight.   Drug/tobacco/alcohol use.   Eating less salt.  Most people do not have symptoms from high blood pressure until it has caused damage to the body.  Effective treatment can often prevent, delay or reduce that damage. TREATMENT  Treatment for high blood pressure, when a cause has been identified, is directed at the cause. There are a large number of medications to treat HTN. These fall into several categories, and your caregiver will help you select the medicines that are best for you. Medications may have side effects. You should review side effects with your caregiver. If your blood pressure stays high after you have made lifestyle changes or started on medicines,   Your medication(s) may need to be changed.   Other problems may need to be addressed.   Be certain you understand your prescriptions, and know how and when to take your medicine.   Be sure to follow up with your caregiver within the time frame advised (usually within two weeks) to have your blood pressure rechecked and to review your medications.   If you are taking more than one medicine to lower your blood pressure, make sure you know how and at what times they should be taken. Taking two medicines at the same time can result in blood pressure that is too low.  Document Released: 05/12/2005 Document Revised: 11/19/2010 Document Reviewed: 05/19/2007 [facility] Patient Information 2012 [place], [place].

## 2011-06-17 ENCOUNTER — Encounter: Payer: Self-pay | Admitting: Physical Medicine & Rehabilitation

## 2011-06-22 DEATH — deceased

## 2011-07-01 ENCOUNTER — Encounter: Payer: Self-pay | Admitting: *Deleted

## 2011-07-01 ENCOUNTER — Ambulatory Visit (INDEPENDENT_AMBULATORY_CARE_PROVIDER_SITE_OTHER): Payer: Medicaid Other | Admitting: Cardiovascular Disease

## 2011-07-01 VITALS — BP 167/105 | HR 89 | Ht 65.0 in | Wt 107.0 lb

## 2011-07-01 DIAGNOSIS — G931 Anoxic brain damage, not elsewhere classified: Secondary | ICD-10-CM

## 2011-07-01 DIAGNOSIS — I10 Essential (primary) hypertension: Secondary | ICD-10-CM

## 2011-07-01 DIAGNOSIS — I509 Heart failure, unspecified: Secondary | ICD-10-CM

## 2011-07-01 DIAGNOSIS — N179 Acute kidney failure, unspecified: Secondary | ICD-10-CM

## 2011-07-01 NOTE — Assessment & Plan Note (Signed)
Initial events surrounding admission in January not clear to me.  Does not appear to be primarily cardiac event.  DCM on ACE.  Needs F/U echo in 6months to a year.  F/U with [name] who saw her in hospital and did her TEE.  Volume will be an issue with her renal failure but diuretic dose adjustments should be done with renal

## 2011-07-01 NOTE — Assessment & Plan Note (Signed)
PT/OT F/U with Dr Hermelinda Medicus

## 2011-07-01 NOTE — Assessment & Plan Note (Signed)
F/U with [name] for nephritis.  Last Cr 04/28/11 still 3.73.  Patient has limited understanding of renal issues

## 2011-07-01 NOTE — Progress Notes (Signed)
Patient ID: Heather Sims, female   DOB: [birth year], 21 y.o.   MRN: [id number] 21 yo of [name].  Long hospitalization in January for "arrest"  Appears to have had respitory arrest and acute renal failure with Cr approaching 5.  Eventually recovered but has ? Anoxic injury with need for PT/OT.  Renal biopsy showed nephritis.  TEE done by [name] showed diffuse hypokinesis with EF 35-40%.  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 No clinical symptoms of CHF, palpitations or syncope.  Seeing [name] for rehab.  [name] is her nephrologist and her Cr has improved.  Denies palpitaitons SSCP, or edema.    ROS: Denies fever, malais, weight loss, blurry vision, decreased visual acuity, cough, sputum, SOB, hemoptysis, pleuritic pain, palpitaitons, heartburn, abdominal pain, melena, lower extremity edema, claudication, or rash.  All other systems reviewed and negative  General: Affect appropriate Thin black female in wheel chair HEENT: normal Neck supple with no adenopathy JVP normal no bruits no thyromegaly Lungs clear with no wheezing and good diaphragmatic motion Heart:  S1/S2 no murmur, no rub, gallop or click PMI normal Abdomen: benighn, BS positve, no tenderness, no AAA no bruit.  No HSM or HJR Distal pulses intact with no bruits No edema Neuro non-focal Skin warm and dry LE weakness   Current Outpatient Prescriptions  Medication Sig Dispense Refill  . amLODipine (NORVASC) 10 MG tablet Take 10 mg by mouth daily.      . calcium-vitamin D (OSCAL WITH D) 500-200 MG-UNIT per tablet Take 1 tablet by mouth 2 (two) times daily.  60 tablet  1  . cholecalciferol (VITAMIN D) 400 UNITS TABS Take 400 Units by mouth 2 (two) times daily.      . clonazePAM (KLONOPIN) 0.5 MG tablet Take 0.5 mg by mouth at bedtime.      . cloNIDine (CATAPRES - DOSED IN MG/24 HR) 0.3 mg/24hr Place 1 patch (0.3 mg total) onto the skin once a week. On tuesdays  4 patch  1  . divalproex (DEPAKOTE) 500 MG DR tablet Take 1  tablet (500 mg total) by mouth 3 (three) times daily.  90 tablet  1  . ferrous sulfate 325 (65 FE) MG tablet Take 325 mg by mouth 3 (three) times daily.      . furosemide (LASIX) 80 MG tablet Take 2 tablets (160 mg total) by mouth 2 (two) times daily.  60 tablet  1  . isosorbide mononitrate (IMDUR) 60 MG 24 hr tablet Take 60 mg by mouth daily.      Marland Kitchen lisinopril (PRINIVIL,ZESTRIL) 10 MG tablet Take 2 tablets (20 mg total) by mouth 2 (two) times daily.  60 tablet  1  . metoprolol tartrate (LOPRESSOR) 25 MG tablet Take 25 mg by mouth 2 (two) times daily.      . minoxidil (LONITEN) 10 MG tablet Take 0.5 tablets (5 mg total) by mouth 2 (two) times daily.  60 tablet  1    Allergies  Review of patient's allergies indicates no known allergies.  Electrocardiogram:  2011/06/12 SR rate 112 LVH no acute ischemic changes  Assessment and Plan

## 2011-07-01 NOTE — Patient Instructions (Signed)
Your physician recommends that you schedule a follow-up appointment in: FOLLOW UP WITH DR [facility]  PHONE NUMBER [phone] Your physician recommends that you continue on your current medications as directed. Please refer to the Current Medication list given to you today. Your physician has requested that you have an echocardiogram. Echocardiography is a painless test that uses sound waves to create images of your heart. It provides your doctor with information about the size and shape of your heart and how well your heart's chambers and valves are working. This procedure takes approximately one hour. There are no restrictions for this procedure. DUE TO HAVE DONE IN YEAR  DX CHF

## 2011-07-01 NOTE — Assessment & Plan Note (Signed)
Well controlled.  Continue current medications and low sodium Dash type diet.    

## 2011-07-03 ENCOUNTER — Encounter: Payer: Medicaid Other | Attending: Physical Medicine & Rehabilitation | Admitting: Physical Medicine & Rehabilitation

## 2011-07-03 ENCOUNTER — Encounter: Payer: Self-pay | Admitting: Physical Medicine & Rehabilitation

## 2011-07-03 VITALS — BP 150/83 | HR 87 | Resp 16 | Ht 62.0 in | Wt 107.0 lb

## 2011-07-03 DIAGNOSIS — D649 Anemia, unspecified: Secondary | ICD-10-CM | POA: Insufficient documentation

## 2011-07-03 DIAGNOSIS — D696 Thrombocytopenia, unspecified: Secondary | ICD-10-CM | POA: Insufficient documentation

## 2011-07-03 DIAGNOSIS — I10 Essential (primary) hypertension: Secondary | ICD-10-CM | POA: Insufficient documentation

## 2011-07-03 DIAGNOSIS — R569 Unspecified convulsions: Secondary | ICD-10-CM

## 2011-07-03 DIAGNOSIS — G40909 Epilepsy, unspecified, not intractable, without status epilepticus: Secondary | ICD-10-CM | POA: Insufficient documentation

## 2011-07-03 DIAGNOSIS — G253 Myoclonus: Secondary | ICD-10-CM | POA: Insufficient documentation

## 2011-07-03 DIAGNOSIS — G931 Anoxic brain damage, not elsewhere classified: Secondary | ICD-10-CM | POA: Insufficient documentation

## 2011-07-03 DIAGNOSIS — I428 Other cardiomyopathies: Secondary | ICD-10-CM

## 2011-07-03 DIAGNOSIS — N19 Unspecified kidney failure: Secondary | ICD-10-CM | POA: Insufficient documentation

## 2011-07-03 MED ORDER — CLONAZEPAM 0.5 MG PO TABS: 0.5000 mg | ORAL_TABLET | Freq: Two times a day (BID) | ORAL | Status: DC | PRN

## 2011-07-03 MED ORDER — MINOXIDIL 10 MG PO TABS: 5.0000 mg | ORAL_TABLET | Freq: Two times a day (BID) | ORAL | Status: DC

## 2011-07-03 MED ORDER — ISOSORBIDE MONONITRATE ER 60 MG PO TB24: 60.0000 mg | ORAL_TABLET | Freq: Every day | ORAL | Status: DC

## 2011-07-03 NOTE — Patient Instructions (Signed)
Await a call from outpt therapy for scheduling of your visits.

## 2011-07-03 NOTE — Progress Notes (Signed)
Subjective:    Patient ID: Heather Sims, female    DOB: June 13, 1990, 21 y.o.   MRN: [id number]  HPI  Heather Sims is back regarding her anoxic brain injury. She's been home about 5 weeks.  She hasn't had much PT due to her MCD pending.  HH only came out for 3 visits. Mom has been working with her at home. She's been walking with a walker for about 2 weeks.  She uses it with supervision/minimal at home.  She uses a wheelchair outside the house.  The clonus is better, but she experiences close to when she walks or fatigues or when she gets anxious.   Memory is improving as his her processing.  She feels like her day to day memory is close to what it was before. She remembers very little from her hospital stay. Sleep is good. Appetite is good, in fact she's always hungry.   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   Pain Inventory Average Pain 0 Pain Right Now 0 My pain is tingling  In the last 24 hours, has pain interfered with the following? General activity 1 Relation with others 1 Enjoyment of life 1 What TIME of day is your pain at its worst? daytime Sleep (in general) Good  Pain is worse with: inactivity Pain improves with: Nothing Relief from Meds: 0  Mobility walk with assistance use a wheelchair  Function disabled: date disabled 03/26/11  Neuro/Psych tremor tingling anxiety  Prior Studies Any changes since last visit?  no  Physicians involved in your care Any changes since last visit?  no  Review of Systems  Constitutional: Negative.   HENT: Negative.   Eyes: Negative.   Respiratory: Negative.   Cardiovascular: Negative.   Gastrointestinal: Negative.   Genitourinary: Negative.   Musculoskeletal: Negative.   Skin: Negative.   Neurological: Positive for tremors.  Psychiatric/Behavioral: The patient is nervous/anxious.        Objective:   Physical Exam  Constitutional: She is oriented to person, place,  and time. She appears well-developed and well-nourished.  HENT:  Head: Normocephalic and atraumatic.  Eyes: Conjunctivae and EOM are normal. Pupils are equal, round, and reactive to light.  Neck: Normal range of motion.  Cardiovascular: Normal rate and regular rhythm.   Pulmonary/Chest: Effort normal and breath sounds normal.  Neurological: She is alert and oriented to person, place, and time. A sensory deficit is present. No cranial nerve deficit.       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 He was brought on more so with volitional movements today. She had no resting hypertonicity. Her reflexes are hyperactive however inboth legs at 3+. She has diminished sensation over the left leg and one out of 2. She complains of paresthesias in the left leg as well. The left leg feels "heavy". From a cognitive standpoint, she has good awareness and fair insight. She knew the date today. She did tell me the date 9 days from now but had to count on her fingers. She is able spell the rib lymph node and backwards but had difficulty with serial sevens. She could remember 3 words after 5 minutes. She seemed to be up on current affairs. This did occasionally lose organization and focus during tasks but seem to be able to compensate by slowing down and reorganizing.  Strength in the legs is grossly 3+ to 4+ out of 5 on the right and 3+ on the left.  Strength was inconsistent and this was difficult to measure due to her myoclonus. Upper extremity strength was within functional limits.  Skin: Skin is warm.  Psychiatric: She has a normal mood and affect. Her behavior is normal.          Assessment & Plan:  1. 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 Myoclonus.  3. Seizure disorder- no further since discharge 4. Renal failure- she follows up with nephrology on April 17 5. Hypertension. Remains borderline. No changes today  6. Anemia.    7. Thrombocytopenia - improved.  8. Mood -much improved  Plan: 1. Will make a referral for outpt PT and SLP. She only has 30 visits, so I would recommend using most of those visits for PT. She likely will need neuropsychological testing at some point as well. 2. Will increase klonopin to 0.5mg  bid to help with myoclonus. Advised patient to go back to nighttime dosing only if the dose change causes excess fatigue. 3. Continue with depakote for seizure prophylaxis 4. I refilled her Imdur  today 5. Maintain current  bp med regimen. 6. Discussed optimizing sleep and diet.  I would try to avoid sleeping excessively during the day. 7. Follow up in 2 months

## 2011-07-09 ENCOUNTER — Ambulatory Visit: Payer: Medicaid Other | Attending: Physical Medicine & Rehabilitation | Admitting: Physical Therapy

## 2011-07-09 DIAGNOSIS — M629 Disorder of muscle, unspecified: Secondary | ICD-10-CM | POA: Insufficient documentation

## 2011-07-09 DIAGNOSIS — R279 Unspecified lack of coordination: Secondary | ICD-10-CM | POA: Insufficient documentation

## 2011-07-09 DIAGNOSIS — R269 Unspecified abnormalities of gait and mobility: Secondary | ICD-10-CM | POA: Insufficient documentation

## 2011-07-09 DIAGNOSIS — M6281 Muscle weakness (generalized): Secondary | ICD-10-CM | POA: Insufficient documentation

## 2011-07-09 DIAGNOSIS — IMO0001 Reserved for inherently not codable concepts without codable children: Secondary | ICD-10-CM | POA: Insufficient documentation

## 2011-07-09 DIAGNOSIS — M2420 Disorder of ligament, unspecified site: Secondary | ICD-10-CM | POA: Insufficient documentation

## 2011-07-16 ENCOUNTER — Ambulatory Visit: Payer: Medicaid Other | Admitting: Physical Therapy

## 2011-07-17 ENCOUNTER — Ambulatory Visit: Payer: Medicaid Other | Admitting: Physical Therapy

## 2011-07-21 ENCOUNTER — Ambulatory Visit: Payer: Medicaid Other | Admitting: Physical Therapy

## 2011-07-24 ENCOUNTER — Ambulatory Visit: Payer: Medicaid Other | Attending: Physical Medicine & Rehabilitation | Admitting: Physical Therapy

## 2011-07-24 DIAGNOSIS — R279 Unspecified lack of coordination: Secondary | ICD-10-CM | POA: Insufficient documentation

## 2011-07-24 DIAGNOSIS — M6281 Muscle weakness (generalized): Secondary | ICD-10-CM | POA: Insufficient documentation

## 2011-07-24 DIAGNOSIS — M2420 Disorder of ligament, unspecified site: Secondary | ICD-10-CM | POA: Insufficient documentation

## 2011-07-24 DIAGNOSIS — M629 Disorder of muscle, unspecified: Secondary | ICD-10-CM | POA: Insufficient documentation

## 2011-07-24 DIAGNOSIS — IMO0001 Reserved for inherently not codable concepts without codable children: Secondary | ICD-10-CM | POA: Insufficient documentation

## 2011-07-24 DIAGNOSIS — R269 Unspecified abnormalities of gait and mobility: Secondary | ICD-10-CM | POA: Insufficient documentation

## 2011-07-29 ENCOUNTER — Ambulatory Visit: Payer: Medicaid Other | Admitting: Physical Therapy

## 2011-07-31 ENCOUNTER — Ambulatory Visit: Payer: Medicaid Other | Admitting: Physical Therapy

## 2011-08-05 ENCOUNTER — Ambulatory Visit: Payer: Medicaid Other | Admitting: Physical Therapy

## 2011-08-07 ENCOUNTER — Ambulatory Visit: Payer: Medicaid Other | Admitting: Physical Therapy

## 2011-08-11 ENCOUNTER — Telehealth: Payer: Self-pay

## 2011-08-11 MED ORDER — DIVALPROEX SODIUM 500 MG PO DR TAB: 500.0000 mg | DELAYED_RELEASE_TABLET | Freq: Three times a day (TID) | ORAL | Status: DC

## 2011-08-11 NOTE — Telephone Encounter (Signed)
Rx sent in, additional refills will have to come from her PCP. Pt mother aware.

## 2011-08-11 NOTE — Telephone Encounter (Signed)
Pt needs refill on depakote

## 2011-08-24 ENCOUNTER — Other Ambulatory Visit: Payer: Self-pay

## 2011-08-24 ENCOUNTER — Encounter (HOSPITAL_COMMUNITY): Payer: Self-pay | Admitting: *Deleted

## 2011-08-24 ENCOUNTER — Inpatient Hospital Stay (HOSPITAL_COMMUNITY)
Admission: EM | Admit: 2011-08-24 | Discharge: 2011-08-28 | DRG: 372 | Disposition: A | Discharge status: Home / Self Care | Payer: Medicaid Other | Source: Ambulatory Visit | Attending: Internal Medicine | Admitting: Internal Medicine

## 2011-08-24 ENCOUNTER — Emergency Department (HOSPITAL_COMMUNITY): Payer: Medicaid Other

## 2011-08-24 DIAGNOSIS — I10 Essential (primary) hypertension: Secondary | ICD-10-CM

## 2011-08-24 DIAGNOSIS — J189 Pneumonia, unspecified organism: Secondary | ICD-10-CM

## 2011-08-24 DIAGNOSIS — D649 Anemia, unspecified: Secondary | ICD-10-CM

## 2011-08-24 DIAGNOSIS — N056 Unspecified nephritic syndrome with dense deposit disease: Secondary | ICD-10-CM

## 2011-08-24 DIAGNOSIS — G931 Anoxic brain damage, not elsewhere classified: Secondary | ICD-10-CM

## 2011-08-24 DIAGNOSIS — N189 Chronic kidney disease, unspecified: Secondary | ICD-10-CM

## 2011-08-24 DIAGNOSIS — D509 Iron deficiency anemia, unspecified: Secondary | ICD-10-CM | POA: Diagnosis present

## 2011-08-24 DIAGNOSIS — N059 Unspecified nephritic syndrome with unspecified morphologic changes: Secondary | ICD-10-CM

## 2011-08-24 DIAGNOSIS — N183 Chronic kidney disease, stage 3 unspecified: Secondary | ICD-10-CM | POA: Diagnosis present

## 2011-08-24 DIAGNOSIS — R0902 Hypoxemia: Secondary | ICD-10-CM

## 2011-08-24 DIAGNOSIS — I129 Hypertensive chronic kidney disease with stage 1 through stage 4 chronic kidney disease, or unspecified chronic kidney disease: Secondary | ICD-10-CM | POA: Diagnosis present

## 2011-08-24 DIAGNOSIS — G253 Myoclonus: Secondary | ICD-10-CM

## 2011-08-24 DIAGNOSIS — I428 Other cardiomyopathies: Secondary | ICD-10-CM | POA: Diagnosis present

## 2011-08-24 DIAGNOSIS — I509 Heart failure, unspecified: Secondary | ICD-10-CM | POA: Diagnosis present

## 2011-08-24 DIAGNOSIS — E875 Hyperkalemia: Secondary | ICD-10-CM | POA: Diagnosis not present

## 2011-08-24 DIAGNOSIS — G40909 Epilepsy, unspecified, not intractable, without status epilepticus: Secondary | ICD-10-CM | POA: Diagnosis present

## 2011-08-24 DIAGNOSIS — K5289 Other specified noninfective gastroenteritis and colitis: Secondary | ICD-10-CM | POA: Diagnosis present

## 2011-08-24 DIAGNOSIS — D62 Acute posthemorrhagic anemia: Secondary | ICD-10-CM | POA: Diagnosis present

## 2011-08-24 DIAGNOSIS — K529 Noninfective gastroenteritis and colitis, unspecified: Secondary | ICD-10-CM | POA: Diagnosis present

## 2011-08-24 DIAGNOSIS — R569 Unspecified convulsions: Secondary | ICD-10-CM

## 2011-08-24 DIAGNOSIS — K5000 Crohn's disease of small intestine without complications: Secondary | ICD-10-CM

## 2011-08-24 DIAGNOSIS — A0472 Enterocolitis due to Clostridium difficile, not specified as recurrent: Principal | ICD-10-CM | POA: Diagnosis present

## 2011-08-24 DIAGNOSIS — Z8674 Personal history of sudden cardiac arrest: Secondary | ICD-10-CM

## 2011-08-24 DIAGNOSIS — E8809 Other disorders of plasma-protein metabolism, not elsewhere classified: Secondary | ICD-10-CM | POA: Diagnosis present

## 2011-08-24 DIAGNOSIS — N055 Unspecified nephritic syndrome with diffuse mesangiocapillary glomerulonephritis: Secondary | ICD-10-CM | POA: Diagnosis present

## 2011-08-24 HISTORY — DX: Heart failure, unspecified: I50.9

## 2011-08-24 HISTORY — DX: Noninfective gastroenteritis and colitis, unspecified: K52.9

## 2011-08-24 HISTORY — DX: Unspecified nephritic syndrome with dense deposit disease: N05.6

## 2011-08-24 LAB — DIFFERENTIAL
Basophils Absolute: 0 10*3/uL (ref 0.0–0.1)
Basophils Relative: 0 % (ref 0–1)
Lymphocytes Relative: 15 % (ref 12–46)
Monocytes Absolute: 1.4 10*3/uL — ABNORMAL HIGH (ref 0.1–1.0)
Neutro Abs: 9.1 10*3/uL — ABNORMAL HIGH (ref 1.7–7.7)
Neutrophils Relative %: 71 % (ref 43–77)

## 2011-08-24 LAB — URINALYSIS, ROUTINE W REFLEX MICROSCOPIC
Nitrite: NEGATIVE
Specific Gravity, Urine: 1.017 (ref 1.005–1.030)
Urobilinogen, UA: 0.2 mg/dL (ref 0.0–1.0)
pH: 5 (ref 5.0–8.0)

## 2011-08-24 LAB — HEPATIC FUNCTION PANEL
ALT: 5 U/L (ref 0–35)
Albumin: 2.3 g/dL — ABNORMAL LOW (ref 3.5–5.2)
Alkaline Phosphatase: 43 U/L (ref 39–117)
Total Protein: 6 g/dL (ref 6.0–8.3)

## 2011-08-24 LAB — CBC
HCT: 17.3 % — ABNORMAL LOW (ref 36.0–46.0)
Hemoglobin: 6.1 g/dL — CL (ref 12.0–15.0)
MCHC: 35.3 g/dL (ref 30.0–36.0)
RDW: 19.6 % — ABNORMAL HIGH (ref 11.5–15.5)
WBC: 12.8 10*3/uL — ABNORMAL HIGH (ref 4.0–10.5)

## 2011-08-24 LAB — URINE MICROSCOPIC-ADD ON

## 2011-08-24 LAB — LIPASE, BLOOD: Lipase: 15 U/L (ref 11–59)

## 2011-08-24 LAB — BASIC METABOLIC PANEL
BUN: 59 mg/dL — ABNORMAL HIGH (ref 6–23)
Chloride: 102 mEq/L (ref 96–112)
GFR calc Af Amer: 32 mL/min — ABNORMAL LOW (ref >90)
GFR calc non Af Amer: 27 mL/min — ABNORMAL LOW (ref >90)
Potassium: 3.9 mEq/L (ref 3.5–5.1)

## 2011-08-24 LAB — VALPROIC ACID LEVEL: Valproic Acid Lvl: 86.1 ug/mL (ref 50.0–100.0)

## 2011-08-24 MED ORDER — METOPROLOL TARTRATE 50 MG PO TABS
50.0000 mg | ORAL_TABLET | Freq: Two times a day (BID) | ORAL | Status: DC
  Administered 2011-08-25 – 2011-08-26 (×5): 50 mg via ORAL
  Filled 2011-08-24 (×7): qty 1

## 2011-08-24 MED ORDER — SODIUM CHLORIDE 0.9 % IV SOLN
INTRAVENOUS | Status: AC
  Administered 2011-08-24: 23:00:00 via INTRAVENOUS

## 2011-08-24 MED ORDER — ENOXAPARIN SODIUM 30 MG/0.3ML ~~LOC~~ SOLN
30.0000 mg | Freq: Every day | SUBCUTANEOUS | Status: DC
Start: 2011-08-25 — End: 2011-08-25
  Administered 2011-08-25: 30 mg via SUBCUTANEOUS
  Filled 2011-08-24: qty 0.3

## 2011-08-24 MED ORDER — ONDANSETRON HCL 4 MG/2ML IJ SOLN
4.0000 mg | Freq: Four times a day (QID) | INTRAMUSCULAR | Status: DC | PRN
  Filled 2011-08-24: qty 2

## 2011-08-24 MED ORDER — ONDANSETRON HCL 4 MG PO TABS
4.0000 mg | ORAL_TABLET | Freq: Four times a day (QID) | ORAL | Status: DC | PRN
  Administered 2011-08-27: 4 mg via ORAL
  Filled 2011-08-24: qty 1

## 2011-08-24 MED ORDER — HYDROMORPHONE HCL PF 1 MG/ML IJ SOLN
1.0000 mg | Freq: Once | INTRAMUSCULAR | Status: AC
  Administered 2011-08-24: 1 mg via INTRAVENOUS
  Filled 2011-08-24: qty 1

## 2011-08-24 MED ORDER — SODIUM CHLORIDE 0.9 % IV BOLUS (SEPSIS)
250.0000 mL | Freq: Once | INTRAVENOUS | Status: AC
  Administered 2011-08-24: 250 mL via INTRAVENOUS

## 2011-08-24 MED ORDER — DIVALPROEX SODIUM 500 MG PO DR TAB
500.0000 mg | DELAYED_RELEASE_TABLET | Freq: Three times a day (TID) | ORAL | Status: DC
  Administered 2011-08-25 – 2011-08-27 (×8): 500 mg via ORAL
  Filled 2011-08-24 (×10): qty 1

## 2011-08-24 MED ORDER — DEXTROSE-NACL 5-0.45 % IV SOLN: INTRAVENOUS | Status: DC

## 2011-08-24 MED ORDER — METRONIDAZOLE IN NACL 5-0.79 MG/ML-% IV SOLN
500.0000 mg | Freq: Three times a day (TID) | INTRAVENOUS | Status: DC
  Administered 2011-08-25 – 2011-08-27 (×7): 500 mg via INTRAVENOUS
  Filled 2011-08-24 (×9): qty 100

## 2011-08-24 MED ORDER — ONDANSETRON HCL 4 MG/2ML IJ SOLN
4.0000 mg | Freq: Once | INTRAMUSCULAR | Status: AC
  Administered 2011-08-24: 4 mg via INTRAVENOUS
  Filled 2011-08-24: qty 2

## 2011-08-24 MED ORDER — CLONAZEPAM 0.5 MG PO TABS
0.5000 mg | ORAL_TABLET | Freq: Two times a day (BID) | ORAL | Status: DC
  Administered 2011-08-25 – 2011-08-28 (×8): 0.5 mg via ORAL
  Filled 2011-08-24 (×8): qty 1

## 2011-08-24 MED ORDER — SODIUM CHLORIDE 0.9 % IV SOLN
INTRAVENOUS | Status: DC
  Administered 2011-08-24 – 2011-08-26 (×3): via INTRAVENOUS

## 2011-08-24 MED ORDER — CIPROFLOXACIN IN D5W 400 MG/200ML IV SOLN
400.0000 mg | Freq: Every day | INTRAVENOUS | Status: DC
  Administered 2011-08-26: 400 mg via INTRAVENOUS
  Filled 2011-08-24 (×2): qty 200

## 2011-08-24 MED ORDER — IOHEXOL 300 MG/ML  SOLN
20.0000 mL | INTRAMUSCULAR | Status: AC
  Administered 2011-08-24 (×2): 20 mL via ORAL

## 2011-08-24 MED ORDER — ISOSORBIDE MONONITRATE ER 60 MG PO TB24
60.0000 mg | ORAL_TABLET | Freq: Every day | ORAL | Status: DC
  Administered 2011-08-25 – 2011-08-28 (×4): 60 mg via ORAL
  Filled 2011-08-24 (×4): qty 1

## 2011-08-24 MED ORDER — HYDROMORPHONE HCL PF 1 MG/ML IJ SOLN
1.0000 mg | INTRAMUSCULAR | Status: DC | PRN
  Administered 2011-08-25 (×2): 1 mg via INTRAVENOUS
  Filled 2011-08-24 (×2): qty 1

## 2011-08-24 MED ORDER — CLONIDINE HCL 0.3 MG/24HR TD PTWK
0.3000 mg | MEDICATED_PATCH | TRANSDERMAL | Status: DC
  Administered 2011-08-25: 0.3 mg via TRANSDERMAL
  Filled 2011-08-24: qty 1

## 2011-08-24 MED ORDER — FERROUS SULFATE 325 (65 FE) MG PO TABS
325.0000 mg | ORAL_TABLET | Freq: Three times a day (TID) | ORAL | Status: DC
  Administered 2011-08-25 – 2011-08-28 (×10): 325 mg via ORAL
  Filled 2011-08-24 (×13): qty 1

## 2011-08-24 MED ORDER — LISINOPRIL 5 MG PO TABS
5.0000 mg | ORAL_TABLET | Freq: Every day | ORAL | Status: DC
  Administered 2011-08-26: 5 mg via ORAL
  Filled 2011-08-24 (×4): qty 1

## 2011-08-24 MED ORDER — AMLODIPINE BESYLATE 10 MG PO TABS
10.0000 mg | ORAL_TABLET | Freq: Every day | ORAL | Status: DC
  Administered 2011-08-25 – 2011-08-28 (×4): 10 mg via ORAL
  Filled 2011-08-24 (×4): qty 1

## 2011-08-24 NOTE — ED Notes (Signed)
Pt finished contrast dye. CT notified

## 2011-08-24 NOTE — ED Provider Notes (Addendum)
History     CSN: [id number]  Arrival date & time 08/24/11  1536   First MD Initiated Contact with Patient 08/24/11 1621      Chief Complaint  Patient presents with  . Abdominal Pain    (Consider location/radiation/quality/duration/timing/severity/associated sxs/prior treatment) The history is provided by the patient and a relative.   patient is a 21 year old female followed [facility]. Patient presents today with abdominal pain preceded by diarrhea 2 days ago. Patient also states abdomen is distended and has swelling in her ankles. No vomiting no blood in the bowel movements the bowel movements were black in color but she was taking Pepto-Bismol. Patient was seen by her nephrologist on Friday just 3 days ago was started on Kayexalate for elevated potassium that she took that on Saturday Saturday when the diarrhea started on Sunday there was no additional diarrhea no additional diarrhea today. With abdominal pain which is generalized persisted from Saturday on. Pain is described as an achy pain is diffuse nonradiating pain is 8/10.  Past Medical History  Diagnosis Date  . Pneumonia last 2 weeks    'walking pneumonia'  . Seizures   . Chronic kidney disease   . Anemia   . Heart attack   . Hypertension   . Renal disorder   . Cardiomyopathy   . Heart failure     Past Surgical History  Procedure Date  . Renal biopsy     History reviewed. No pertinent family history.  History  Substance Use Topics  . Smoking status: Never Smoker   . Smokeless tobacco: Not on file  . Alcohol Use: No    OB History    Grav Para Term Preterm Abortions TAB SAB Ect Mult Living                  Review of Systems  Constitutional: Positive for fatigue. Negative for fever.  HENT: Negative for congestion and neck pain.   Eyes: Negative for visual disturbance.  Respiratory: Negative for shortness of breath.   Cardiovascular: Negative for chest pain.    Gastrointestinal: Positive for abdominal pain, diarrhea and abdominal distention. Negative for nausea, vomiting, blood in stool and anal bleeding.  Genitourinary: Negative for dysuria.  Musculoskeletal: Negative for back pain.  Skin: Negative for rash.  Neurological: Positive for weakness. Negative for headaches.  Hematological: Does not bruise/bleed easily.    Allergies  Review of patient's allergies indicates no known allergies.  Home Medications   Current Outpatient Rx  Name Route Sig Dispense Refill  . AMLODIPINE BESYLATE 10 MG PO TABS Oral Take 10 mg by mouth daily.    Marland Kitchen CLONAZEPAM 0.5 MG PO TABS Oral Take 0.5 mg by mouth 2 (two) times daily.     Marland Kitchen CLONIDINE HCL 0.3 MG/24HR TD PTWK Transdermal Place 1 patch onto the skin once a week. Apply on Tuesdays.    Marland Kitchen DIVALPROEX SODIUM 500 MG PO TBEC Oral Take 500 mg by mouth 3 (three) times daily.    Marland Kitchen FERROUS SULFATE 325 (65 FE) MG PO TABS Oral Take 325 mg by mouth 3 (three) times daily.    . FUROSEMIDE 80 MG PO TABS Oral Take 40 mg by mouth every evening.    . ISOSORBIDE MONONITRATE ER 60 MG PO TB24 Oral Take 60 mg by mouth daily.    Marland Kitchen LISINOPRIL 5 MG PO TABS Oral Take 5 mg by mouth every evening.    Marland Kitchen METOPROLOL TARTRATE 50 MG PO TABS  Oral Take 50 mg by mouth 2 (two) times daily.      BP 154/99  Pulse 88  Temp(Src) 99 F (37.2 C) (Oral)  Resp 16  SpO2 96%  LMP 05/24/2011  Physical Exam  Nursing note and vitals reviewed. Constitutional: She is oriented to person, place, and time. She appears well-developed and well-nourished. No distress.  HENT:  Head: Normocephalic and atraumatic.       Mucous membranes are dry.  Eyes: Conjunctivae and EOM are normal. Pupils are equal, round, and reactive to light.  Neck: Normal range of motion. Neck supple.  Cardiovascular: Normal rate, regular rhythm and normal heart sounds.   Pulmonary/Chest: Effort normal and breath sounds normal. No respiratory distress. She has no rales.  Abdominal:  Soft. Bowel sounds are normal. She exhibits distension. There is tenderness. There is no rebound and no guarding.  Musculoskeletal: Normal range of motion. She exhibits edema. She exhibits no tenderness.  Neurological: She is alert and oriented to person, place, and time. No cranial nerve deficit. She exhibits normal muscle tone. Coordination normal.  Skin: Skin is warm. No rash noted.    ED Course  Procedures (including critical care time)  Labs Reviewed  BASIC METABOLIC PANEL - Abnormal; Notable for the following:    Glucose, Bld 103 (*)    BUN 59 (*)    Creatinine, Ser 2.42 (*)    GFR calc non Af Amer 27 (*)    GFR calc Af Amer 32 (*)    All other components within normal limits  CBC - Abnormal; Notable for the following:    WBC 12.8 (*)    RBC 1.96 (*)    Hemoglobin 6.1 (*)    HCT 17.3 (*)    RDW 19.6 (*)    Platelets 93 (*) PLATELET COUNT CONFIRMED BY SMEAR   All other components within normal limits  DIFFERENTIAL - Abnormal; Notable for the following:    Neutro Abs 9.1 (*)    Monocytes Absolute 1.4 (*)    All other components within normal limits  URINALYSIS, ROUTINE W REFLEX MICROSCOPIC - Abnormal; Notable for the following:    APPearance CLOUDY (*)    Hgb urine dipstick LARGE (*)    Bilirubin Urine SMALL (*)    Protein, ur 100 (*)    Leukocytes, UA MODERATE (*)    All other components within normal limits  HEPATIC FUNCTION PANEL - Abnormal; Notable for the following:    Albumin 2.3 (*)    Total Bilirubin 0.2 (*)    All other components within normal limits  URINE MICROSCOPIC-ADD ON - Abnormal; Notable for the following:    Squamous Epithelial / LPF MANY (*)    Bacteria, UA MANY (*)    All other components within normal limits  LIPASE, BLOOD  VALPROIC ACID LEVEL  POCT PREGNANCY, URINE  URINE CULTURE  PREPARE RBC (CROSSMATCH)  ABO/RH   Ct Abdomen Pelvis Wo Contrast  08/24/2011  *RADIOLOGY REPORT*  Clinical Data: Abdominal distention and weakness.  History of  renal disease.  CT ABDOMEN AND PELVIS WITHOUT CONTRAST  Technique:  Multidetector CT imaging of the abdomen and pelvis was performed following the standard protocol without intravenous contrast.  Comparison: Report from renal ultrasound dated 03/29/2011. It is noted that the patient's prior imaging studies are not currently merged with this CT.  The CT technologist has been notified and will leave a message for IT to merge this study with prior imaging studies.  Findings: At the lung bases, the  heart size appears mildly enlarged, but is not completely included in the imaging field. A small pericardial effusion is seen anteriorly.  There is a small left pleural effusion. Left basilar atelectasis is present.  There is a moderate degree of abdominal ascites, and a large volume of pelvic ascites.  There are no prior studies of the abdomen pelvis for comparison.  The noncontrast appearance of the liver, gallbladder, spleen, adrenal glands, pancreas, and kidneys is within normal limits. Both kidneys are normal in size and parenchymal thickness.  No urinary tract stones are identified.  No evidence of ureteral dilatation; the ureters are difficult to follow in their entirety due to the ascites.  The patient was given oral contrast, which has progressed to the level of the cecum at the time of the exam. In the right aspect of the pelvis, there is circumferentially thickened of the distal ileum, with a somewhat nodular and thickened wall.  Wall thickness measures up to 10 mm. No definite thickening of the terminal ileum. The appendix is seen on images 52-55 and is normal.  Proximal small bowel loops appear normal in wall thickness.  No evidence of bowel obstruction.  Moderate amount of stool in the sigmoid colon and rectum.  Urinary bladder, uterus, and ovaries within normal limits for noncontrast CT.  No definite pathologic lymphadenopathy, although evaluation is limited without intravenous contrast, and due to obscuration  of the fat planes about the ascites.  No acute or suspicious bony abnormality.  Sacroiliac joints within normal limits.  The  IMPRESSION: 1.  Moderate to large volume of abdominal and pelvic ascites.   2.  Markedly abnormal circumferential wall thickening of the distal ileum without definite involvement of the terminal ileum. Findings could be due to inflammatory bowel disease or infectious ileitis.  Small bowel lymphoma or small bowel hemorrhage would be considered less likely. 3.  Small left pleural effusion. 4.  Small pericardial effusion. 5.  Suspect cardiomegaly, although heart is not completely included on the image.  Original Report Authenticated By: Britta Mccreedy, M.D.   Results for orders placed during the hospital encounter of 08/24/11  BASIC METABOLIC PANEL      Component Value Range   Sodium 135  135 - 145 (mEq/L)   Potassium 3.9  3.5 - 5.1 (mEq/L)   Chloride 102  96 - 112 (mEq/L)   CO2 21  19 - 32 (mEq/L)   Glucose, Bld 103 (*) 70 - 99 (mg/dL)   BUN 59 (*) 6 - 23 (mg/dL)   Creatinine, Ser 2.13 (*) 0.50 - 1.10 (mg/dL)   Calcium 8.7  8.4 - 08.6 (mg/dL)   GFR calc non Af Amer 27 (*) >90 (mL/min)   GFR calc Af Amer 32 (*) >90 (mL/min)  CBC      Component Value Range   WBC 12.8 (*) 4.0 - 10.5 (K/uL)   RBC 1.96 (*) 3.87 - 5.11 (MIL/uL)   Hemoglobin 6.1 (*) 12.0 - 15.0 (g/dL)   HCT 57.8 (*) 46.9 - 46.0 (%)   MCV 88.3  78.0 - 100.0 (fL)   MCH 31.1  26.0 - 34.0 (pg)   MCHC 35.3  30.0 - 36.0 (g/dL)   RDW 62.9 (*) 52.8 - 15.5 (%)   Platelets 93 (*) 150 - 400 (K/uL)  DIFFERENTIAL      Component Value Range   Neutrophils Relative 71  43 - 77 (%)   Neutro Abs 9.1 (*) 1.7 - 7.7 (K/uL)   Lymphocytes Relative 15  12 - 46 (%)  Lymphs Abs 1.9  0.7 - 4.0 (K/uL)   Monocytes Relative 11  3 - 12 (%)   Monocytes Absolute 1.4 (*) 0.1 - 1.0 (K/uL)   Eosinophils Relative 3  0 - 5 (%)   Eosinophils Absolute 0.4  0.0 - 0.7 (K/uL)   Basophils Relative 0  0 - 1 (%)   Basophils Absolute 0.0  0.0 -  0.1 (K/uL)  URINALYSIS, ROUTINE W REFLEX MICROSCOPIC      Component Value Range   Color, Urine YELLOW  YELLOW    APPearance CLOUDY (*) CLEAR    Specific Gravity, Urine 1.017  1.005 - 1.030    pH 5.0  5.0 - 8.0    Glucose, UA NEGATIVE  NEGATIVE (mg/dL)   Hgb urine dipstick LARGE (*) NEGATIVE    Bilirubin Urine SMALL (*) NEGATIVE    Ketones, ur NEGATIVE  NEGATIVE (mg/dL)   Protein, ur 829 (*) NEGATIVE (mg/dL)   Urobilinogen, UA 0.2  0.0 - 1.0 (mg/dL)   Nitrite NEGATIVE  NEGATIVE    Leukocytes, UA MODERATE (*) NEGATIVE   LIPASE, BLOOD      Component Value Range   Lipase 15  11 - 59 (U/L)  HEPATIC FUNCTION PANEL      Component Value Range   Total Protein 6.0  6.0 - 8.3 (g/dL)   Albumin 2.3 (*) 3.5 - 5.2 (g/dL)   AST 18  0 - 37 (U/L)   ALT 5  0 - 35 (U/L)   Alkaline Phosphatase 43  39 - 117 (U/L)   Total Bilirubin 0.2 (*) 0.3 - 1.2 (mg/dL)   Bilirubin, Direct <5.6  0.0 - 0.3 (mg/dL)   Indirect Bilirubin NOT CALCULATED  0.3 - 0.9 (mg/dL)  VALPROIC ACID LEVEL      Component Value Range   Valproic Acid Lvl 86.1  50.0 - 100.0 (ug/mL)  POCT PREGNANCY, URINE      Component Value Range   Preg Test, Ur NEGATIVE  NEGATIVE   URINE MICROSCOPIC-ADD ON      Component Value Range   Squamous Epithelial / LPF MANY (*) RARE    WBC, UA 11-20  <3 (WBC/hpf)   RBC / HPF 21-50  <3 (RBC/hpf)   Bacteria, UA MANY (*) RARE      1. Ileitis   2. Nephritis       MDM   Patient with known nephritis followed closely by [facility]. Presents today with abdominal pain preceded by diarrhea 2 days ago but that was right after having Kayexalate. Potassium is now normal evening creatinine is in her normal range denies any blood in her bowel movements. There was some darkness to the bowel movement which she was taking Pepto-Bismol. Hemoglobin and hematocrit compared to March shows a significant anemia may be of chronic kidney disease. Have ordered one unit of blood transfusion. CT scan shows an ileitis  patient would benefit from bowel rest. Patient's findings discussed with on call nephrologist and with hospitalist. Urine was sent for culture even though a large white blood cells and RBCs are most likely related to nephritis. Patient will be admitted transfused one unit bowel rest fluids and observed.  Abdomen is soft distended no significant tenderness or guarding. Not consistent with a surgical abdomen. Patient's Depakote level is normal some of her fatigue was thought maybe be due to an elevation in her Depakote level but has not such.       Shelda Jakes, MD 08/24/11 2206  Shelda Jakes, MD 08/24/11 2211

## 2011-08-24 NOTE — ED Notes (Signed)
Pt transported to CT ?

## 2011-08-24 NOTE — ED Notes (Signed)
IV team called. 

## 2011-08-24 NOTE — ED Notes (Signed)
Pt returned from CT °

## 2011-08-24 NOTE — ED Notes (Signed)
Per Mom: pt. Has been "weak, sleepy, pale. Diarrhea. Denies N/V.

## 2011-08-24 NOTE — ED Notes (Addendum)
PT went to dr. Caryn Section on Friday and was told that potassium was critical high and was given medicine and now sleepy, pale, diarrhea and abdominal pain. Pt has  Bloated abdomen and swelling in ankles.  Pt is in renal failure but not to the point of needing dialysis.

## 2011-08-24 NOTE — ED Notes (Signed)
Attempted calling report to [phone], was told RN would return call

## 2011-08-24 NOTE — ED Notes (Signed)
Per Mom: Pt has been weak, pale,sleepy, and complaining of abdominal pain x 2 days.  Pt. Is tender on palpation in in LUQ. Abdomen is distended. Swelling in feet bilaterally x 2 days. Pt denies N/V. Denies Chest pain. Denies SOB. NAD

## 2011-08-25 ENCOUNTER — Encounter (HOSPITAL_COMMUNITY): Payer: Self-pay | Admitting: Physician Assistant

## 2011-08-25 DIAGNOSIS — K5000 Crohn's disease of small intestine without complications: Secondary | ICD-10-CM

## 2011-08-25 DIAGNOSIS — N189 Chronic kidney disease, unspecified: Secondary | ICD-10-CM

## 2011-08-25 DIAGNOSIS — N056 Unspecified nephritic syndrome with dense deposit disease: Secondary | ICD-10-CM | POA: Insufficient documentation

## 2011-08-25 DIAGNOSIS — I10 Essential (primary) hypertension: Secondary | ICD-10-CM

## 2011-08-25 DIAGNOSIS — D649 Anemia, unspecified: Secondary | ICD-10-CM

## 2011-08-25 LAB — CBC
HCT: 16.7 % — ABNORMAL LOW (ref 36.0–46.0)
HCT: 23 % — ABNORMAL LOW (ref 36.0–46.0)
Hemoglobin: 5.8 g/dL — CL (ref 12.0–15.0)
MCH: 30.1 pg (ref 26.0–34.0)
MCV: 86.5 fL (ref 78.0–100.0)
MCV: 88.4 fL (ref 78.0–100.0)
RBC: 1.89 MIL/uL — ABNORMAL LOW (ref 3.87–5.11)
RBC: 2.66 MIL/uL — ABNORMAL LOW (ref 3.87–5.11)
RDW: 18.6 % — ABNORMAL HIGH (ref 11.5–15.5)
RDW: 19.6 % — ABNORMAL HIGH (ref 11.5–15.5)
WBC: 12.2 10*3/uL — ABNORMAL HIGH (ref 4.0–10.5)
WBC: 14.3 10*3/uL — ABNORMAL HIGH (ref 4.0–10.5)

## 2011-08-25 LAB — CREATININE, SERUM
GFR calc Af Amer: 34 mL/min — ABNORMAL LOW (ref >90)
GFR calc non Af Amer: 30 mL/min — ABNORMAL LOW (ref >90)

## 2011-08-25 LAB — IRON AND TIBC
Iron: 47 ug/dL (ref 42–135)
TIBC: 215 ug/dL — ABNORMAL LOW (ref 250–470)
UIBC: 168 ug/dL (ref 125–400)

## 2011-08-25 LAB — COMPREHENSIVE METABOLIC PANEL
AST: 18 U/L (ref 0–37)
Albumin: 2.4 g/dL — ABNORMAL LOW (ref 3.5–5.2)
Alkaline Phosphatase: 39 U/L (ref 39–117)
BUN: 57 mg/dL — ABNORMAL HIGH (ref 6–23)
Chloride: 102 mEq/L (ref 96–112)
Potassium: 3.9 mEq/L (ref 3.5–5.1)
Total Bilirubin: 0.3 mg/dL (ref 0.3–1.2)
Total Protein: 6.3 g/dL (ref 6.0–8.3)

## 2011-08-25 LAB — RETICULOCYTES: Retic Ct Pct: 4.4 % — ABNORMAL HIGH (ref 0.4–3.1)

## 2011-08-25 LAB — TSH: TSH: 4.57 u[IU]/mL — ABNORMAL HIGH (ref 0.350–4.500)

## 2011-08-25 LAB — FERRITIN: Ferritin: 360 ng/mL — ABNORMAL HIGH (ref 10–291)

## 2011-08-25 LAB — ABO/RH: ABO/RH(D): O POS

## 2011-08-25 NOTE — Progress Notes (Signed)
Patient ID: Veva Holes, female   DOB: [birth year], 21 y.o.   MRN: [id number]  PATIENT DETAILS Name: RANDI POULLARD Age: 21 y.o. Sex: female Date of Birth: Oct 23, 1990 Admit Date: 08/24/2011 YQM:VHQI, Odette Horns, MD, MD  Interim History: This is a 21 year old female with a complex past medical history that started in January 2013 with cardiac and respiratory arrest.  When she awoke she was found to have anoxic brain injury, seizures, severe kidney disease and CHF.  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  She presented to the ED with intermittent abdominal pain since Saturday (6/1). She was seen by her nephrologist on Friday and was diagnosed with hyperkalemia. She was given Kayexalate. By Saturday morning she started having abdominal pain and diarrhea. The abdominal pain is centrally located rated as 8/10 with no radiation, mild nausea but not vomiting. She reports black tarry stools (but was on iron). In the ED where her hemoglobin was found to be  6 and she had findings suggestive of ileitis on her CT scan. She denies fever or chills, no sick contacts, no history of inflammatory bowel disease.  She has received one unit of PRBCs overnight.  Subjective: No stools since admission.  Still with some abdominal pain.  Objective: Weight change:   Intake/Output Summary (Last 24 hours) at 08/25/11 1030 Last data filed at 08/25/11 0900  Gross per 24 hour  Intake 1210.5 ml  Output      0 ml  Net 1210.5 ml   Blood pressure 111/82, pulse 86, temperature 98.3 F (36.8 C), temperature source Oral, resp. rate 17, height 5\' 2"  (1.575 m), weight 53.3 kg (117 lb 8.1 oz), last menstrual period 05/24/2011, SpO2 100.00%. Filed Vitals:   08/25/11 0051 08/25/11 0140 08/25/11 0325 08/25/11 0500  BP: 127/84 123/84 142/91 111/82  Pulse: 79 74 72 86  Temp: 98.7 F (37.1 C) 98.3 F (36.8 C) 96.5 F (35.8 C) 98.3 F  (36.8 C)  TempSrc: Oral Oral Axillary Oral  Resp: 17 18 18 17   Height:      Weight:      SpO2:    100%    Physical Exam: General: appears youthful but chronically ill, mother provides most of the history.  A&O, NAD Lungs: Clear to auscultation bilaterally without wheezes or crackles Cardiovascular: Regular rate and rhythm without murmur gallop or rub normal S1 and S2 Abdomen: Mildly distended, soft, TTP just right of umbilicus, soft, bowel sounds positive Extremities: No significant cyanosis, clubbing, or edema bilateral lower extremities  Basic Metabolic Panel:  Lab 08/25/11 6962 08/24/11 2349 08/24/11 1601  NA 137 -- 135  K 3.9 -- 3.9  CL 102 -- 102  CO2 21 -- 21  GLUCOSE 82 -- 103*  BUN 57* -- 59*  CREATININE 2.21* 2.26* --  CALCIUM 8.8 -- 8.7  MG -- -- --  PHOS -- -- --   Liver Function Tests:  Lab 08/25/11 0605 08/24/11 1740  AST 18 18  ALT 6 5  ALKPHOS 39 43  BILITOT 0.3 0.2*  PROT 6.3 6.0  ALBUMIN 2.4* 2.3*    Lab 08/24/11 1740  LIPASE 15  AMYLASE --   CBC:  Lab 08/25/11 0605 08/24/11 2349 08/24/11 1601  WBC 14.3* 12.2* --  NEUTROABS -- -- 9.1*  HGB 8.0* 5.8* --  HCT 23.0* 16.7* --  MCV 86.5 88.4 --  PLT 104* 79* --   Anemia Panel:  Lab 08/25/11 0605  VITAMINB12 --  FOLATE --  FERRITIN --  TIBC --  IRON --  RETICCTPCT 4.4*   Urine Drug Screen: Drugs of Abuse     Component Value Date/Time   LABOPIA NEGATIVE 03/27/2011 0300   LABOPIA NONE DETECTED 03/26/2011 2041   COCAINSCRNUR NEGATIVE 03/27/2011 0300   COCAINSCRNUR NONE DETECTED 03/26/2011 2041   LABBENZ NEGATIVE 03/27/2011 0300   LABBENZ NONE DETECTED 03/26/2011 2041   AMPHETMU NEGATIVE 03/27/2011 0300   AMPHETMU NONE DETECTED 03/26/2011 2041   THCU NONE DETECTED 03/26/2011 2041   LABBARB NONE DETECTED 03/26/2011 2041     Studies/Results:  CT Abd/Pelvis IMPRESSION:  1. Moderate to large volume of abdominal and pelvic ascites.  2. Markedly abnormal circumferential wall thickening of the distal  ileum without definite involvement of the terminal ileum.  Findings could be due to inflammatory bowel disease or infectious ileitis. Small bowel lymphoma or small bowel hemorrhage would be considered less likely.  3. Small left pleural effusion.  4. Small pericardial effusion.  5. Suspect cardiomegaly, although heart is not completely included on the image.    Scheduled Meds:   . sodium chloride   Intravenous STAT  . amLODipine  10 mg Oral Daily  . ciprofloxacin  400 mg Intravenous QHS  . clonazePAM  0.5 mg Oral BID  . cloNIDine  0.3 mg Transdermal Weekly  . divalproex  500 mg Oral TID  . enoxaparin  30 mg Subcutaneous Daily  . ferrous sulfate  325 mg Oral TID WC  . HYDROmorphone  1 mg Intravenous Once  . iohexol  20 mL Oral Q1 Hr x 2  . isosorbide mononitrate  60 mg Oral Daily  . lisinopril  5 mg Oral QHS  . metoprolol  50 mg Oral BID  . metronidazole  500 mg Intravenous Q8H  . ondansetron  4 mg Intravenous Once  . sodium chloride  250 mL Intravenous Once   Continuous Infusions:   . sodium chloride 100 mL/hr at 08/25/11 0500  . DISCONTD: dextrose 5 % and 0.45% NaCl     PRN Meds:.HYDROmorphone (DILAUDID) injection, ondansetron (ZOFRAN) IV, ondansetron  Anti-infectives:  Anti-infectives     Start     Dose/Rate Route Frequency Ordered Stop   08/25/11 2359   ciprofloxacin (CIPRO) IVPB 400 mg        400 mg 200 mL/hr over 60 Minutes Intravenous Daily at bedtime 08/24/11 2250     08/24/11 2359   metroNIDAZOLE (FLAGYL) IVPB 500 mg        500 mg 100 mL/hr over 60 Minutes Intravenous 3 times per day 08/24/11 2250            Assessment/Plan: Principal Problem:  *Ileitis Active Problems:  Anemia  Hypertension  CKD (chronic kidney disease)  Hypoalbuminemia   1.  Ileitis Abdominal pain and distention with circumferential thickening of the ileum on CT.   Started on cipro and flagyl.  [facility] consulted.  2.  Microcytic anemia.   05/2011 hgb was 15.0.  Patient reports  black tarry tools but has been on iron.  She received a unit of PRBCs over night for a hgb of 5.8. Likely her severe kidney disease is contributing to her anemia, but would appreciate any GI recommendations.  3.  MPGN. Renal is on board.  Appreciate their recommendations.  4.  Hypertension.   On Amlodipine, Clonidine, Lisinopril, and metoprolol, Imdur.  Moderately controlled.  Will montor.  DVT Prophylaxis:  Will D/C lovenox and start SCDs.  LOS: 1 day   Stephani Police 08/25/2011, 10:30 AM [phone]

## 2011-08-25 NOTE — Progress Notes (Signed)
Pt arrived via stretcher to [id number].  Denied any complaints.  Pt alert and oriented x4.  Oriented patient to surroundings, discussed fall safety plan, and showed safety video.  Informed pt to use call bell for any assistance.

## 2011-08-25 NOTE — Progress Notes (Signed)
-  Agree with above, have reviewed the data. -consult GI.

## 2011-08-25 NOTE — H&P (Signed)
Heather Sims is an 21 y.o. female.   Chief Complaint: Abdominal pain HPI: [name] who is recently diagnosed with nephritis by [facility] who has been having some abdominal pain on and off since Saturday. She was seen by her nephrologist on Friday and was diagnosed with hyperkalemia. She was given Kayexalate which she took. By Saturday morning she started having abdominal pain and diarrhea. The abdominal toes centrally located rated as 8/10 with no radiation, mild nausea but not vomiting no melena no bright red blood per rectum but she was having profuse diarrhea. She was seen in the ED where her hemoglobin was found to be only 6 but also findings suggestive of ileitis on her CT scan. She denies fever or chills, no sick contacts, no history of inflammatory bowel disease.  Past Medical History  Diagnosis Date  . Pneumonia last 2 weeks    'walking pneumonia'  . Seizures   . Chronic kidney disease   . Anemia   . Heart attack   . Hypertension   . Renal disorder   . Cardiomyopathy   . Heart failure   . CHF (congestive heart failure)     Past Surgical History  Procedure Date  . Renal biopsy     History reviewed. No pertinent family history. Social History:  reports that she has never smoked. She does not have any smokeless tobacco history on file. She reports that she does not drink alcohol or use illicit drugs.  Allergies: No Known Allergies  Medications Prior to Admission  Medication Sig Dispense Refill  . amLODipine (NORVASC) 10 MG tablet Take 10 mg by mouth daily.      . clonazePAM (KLONOPIN) 0.5 MG tablet Take 0.5 mg by mouth 2 (two) times daily.       . cloNIDine (CATAPRES - DOSED IN MG/24 HR) 0.3 mg/24hr Place 1 patch onto the skin once a week. Apply on Tuesdays.      . divalproex (DEPAKOTE) 500 MG DR tablet Take 500 mg by mouth 3 (three) times daily.      . ferrous sulfate 325 (65 FE) MG tablet Take 325 mg by mouth 3 (three) times daily.      .  furosemide (LASIX) 80 MG tablet Take 40 mg by mouth every evening.      . isosorbide mononitrate (IMDUR) 60 MG 24 hr tablet Take 60 mg by mouth daily.      Marland Kitchen lisinopril (PRINIVIL,ZESTRIL) 5 MG tablet Take 5 mg by mouth every evening.      . metoprolol (LOPRESSOR) 50 MG tablet Take 50 mg by mouth 2 (two) times daily.        Results for orders placed during the hospital encounter of 08/24/11 (from the past 48 hour(s))  BASIC METABOLIC PANEL     Status: Abnormal   Collection Time   08/24/11  4:01 PM      Component Value Range Comment   Sodium 135  135 - 145 (mEq/L)    Potassium 3.9  3.5 - 5.1 (mEq/L)    Chloride 102  96 - 112 (mEq/L)    CO2 21  19 - 32 (mEq/L)    Glucose, Bld 103 (*) 70 - 99 (mg/dL)    BUN 59 (*) 6 - 23 (mg/dL)    Creatinine, Ser 1.61 (*) 0.50 - 1.10 (mg/dL)    Calcium 8.7  8.4 - 10.5 (mg/dL)    GFR calc non Af Amer 27 (*) >90 (mL/min)  GFR calc Af Amer 32 (*) >90 (mL/min)   CBC     Status: Abnormal   Collection Time   08/24/11  4:01 PM      Component Value Range Comment   WBC 12.8 (*) 4.0 - 10.5 (K/uL)    RBC 1.96 (*) 3.87 - 5.11 (MIL/uL)    Hemoglobin 6.1 (*) 12.0 - 15.0 (g/dL)    HCT 11.9 (*) 14.7 - 46.0 (%)    MCV 88.3  78.0 - 100.0 (fL)    MCH 31.1  26.0 - 34.0 (pg)    MCHC 35.3  30.0 - 36.0 (g/dL)    RDW 82.9 (*) 56.2 - 15.5 (%)    Platelets 93 (*) 150 - 400 (K/uL) PLATELET COUNT CONFIRMED BY SMEAR  DIFFERENTIAL     Status: Abnormal   Collection Time   08/24/11  4:01 PM      Component Value Range Comment   Neutrophils Relative 71  43 - 77 (%)    Neutro Abs 9.1 (*) 1.7 - 7.7 (K/uL)    Lymphocytes Relative 15  12 - 46 (%)    Lymphs Abs 1.9  0.7 - 4.0 (K/uL)    Monocytes Relative 11  3 - 12 (%)    Monocytes Absolute 1.4 (*) 0.1 - 1.0 (K/uL)    Eosinophils Relative 3  0 - 5 (%)    Eosinophils Absolute 0.4  0.0 - 0.7 (K/uL)    Basophils Relative 0  0 - 1 (%)    Basophils Absolute 0.0  0.0 - 0.1 (K/uL)   LIPASE, BLOOD     Status: Normal   Collection Time    08/24/11  5:40 PM      Component Value Range Comment   Lipase 15  11 - 59 (U/L)   HEPATIC FUNCTION PANEL     Status: Abnormal   Collection Time   08/24/11  5:40 PM      Component Value Range Comment   Total Protein 6.0  6.0 - 8.3 (g/dL)    Albumin 2.3 (*) 3.5 - 5.2 (g/dL)    AST 18  0 - 37 (U/L)    ALT 5  0 - 35 (U/L)    Alkaline Phosphatase 43  39 - 117 (U/L)    Total Bilirubin 0.2 (*) 0.3 - 1.2 (mg/dL)    Bilirubin, Direct <1.3  0.0 - 0.3 (mg/dL)    Indirect Bilirubin NOT CALCULATED  0.3 - 0.9 (mg/dL)   VALPROIC ACID LEVEL     Status: Normal   Collection Time   08/24/11  5:40 PM      Component Value Range Comment   Valproic Acid Lvl 86.1  50.0 - 100.0 (ug/mL)   URINALYSIS, ROUTINE W REFLEX MICROSCOPIC     Status: Abnormal   Collection Time   08/24/11  7:02 PM      Component Value Range Comment   Color, Urine YELLOW  YELLOW     APPearance CLOUDY (*) CLEAR     Specific Gravity, Urine 1.017  1.005 - 1.030     pH 5.0  5.0 - 8.0     Glucose, UA NEGATIVE  NEGATIVE (mg/dL)    Hgb urine dipstick LARGE (*) NEGATIVE     Bilirubin Urine SMALL (*) NEGATIVE     Ketones, ur NEGATIVE  NEGATIVE (mg/dL)    Protein, ur 086 (*) NEGATIVE (mg/dL)    Urobilinogen, UA 0.2  0.0 - 1.0 (mg/dL)    Nitrite NEGATIVE  NEGATIVE     Leukocytes,  UA MODERATE (*) NEGATIVE    URINE MICROSCOPIC-ADD ON     Status: Abnormal   Collection Time   08/24/11  7:02 PM      Component Value Range Comment   Squamous Epithelial / LPF MANY (*) RARE     WBC, UA 11-20  <3 (WBC/hpf)    RBC / HPF 21-50  <3 (RBC/hpf)    Bacteria, UA MANY (*) RARE    POCT PREGNANCY, URINE     Status: Normal   Collection Time   08/24/11  7:10 PM      Component Value Range Comment   Preg Test, Ur NEGATIVE  NEGATIVE    ABO/RH     Status: Normal (Preliminary result)   Collection Time   08/24/11  9:59 PM      Component Value Range Comment   ABO/RH(D) O POS     TYPE AND SCREEN     Status: Normal (Preliminary result)   Collection Time   08/24/11  9:59 PM       Component Value Range Comment   ABO/RH(D) O POS      Antibody Screen NEG      Sample Expiration 08/27/2011      Unit Number [id number]      Blood Component Type RED CELLS,LR      Unit division 00      Status of Unit ISSUED      Transfusion Status OK TO TRANSFUSE      Crossmatch Result Compatible     PREPARE RBC (CROSSMATCH)     Status: Normal   Collection Time   08/24/11 10:00 PM      Component Value Range Comment   Order Confirmation ORDER PROCESSED BY BLOOD BANK     CBC     Status: Abnormal   Collection Time   08/24/11 11:49 PM      Component Value Range Comment   WBC 12.2 (*) 4.0 - 10.5 (K/uL)    RBC 1.89 (*) 3.87 - 5.11 (MIL/uL)    Hemoglobin 5.8 (*) 12.0 - 15.0 (g/dL) CRITICAL VALUE NOTED.  VALUE IS CONSISTENT WITH PREVIOUSLY REPORTED AND CALLED VALUE.   HCT 16.7 (*) 36.0 - 46.0 (%)    MCV 88.4  78.0 - 100.0 (fL)    MCH 30.7  26.0 - 34.0 (pg)    MCHC 34.7  30.0 - 36.0 (g/dL)    RDW 78.2 (*) 95.6 - 15.5 (%)    Platelets 79 (*) 150 - 400 (K/uL) CONSISTENT WITH PREVIOUS RESULT  CREATININE, SERUM     Status: Abnormal   Collection Time   08/24/11 11:49 PM      Component Value Range Comment   Creatinine, Ser 2.26 (*) 0.50 - 1.10 (mg/dL)    GFR calc non Af Amer 30 (*) >90 (mL/min)    GFR calc Af Amer 34 (*) >90 (mL/min)    Ct Abdomen Pelvis Wo Contrast  08/24/2011  *RADIOLOGY REPORT*  Clinical Data: Abdominal distention and weakness.  History of renal disease.  CT ABDOMEN AND PELVIS WITHOUT CONTRAST  Technique:  Multidetector CT imaging of the abdomen and pelvis was performed following the standard protocol without intravenous contrast.  Comparison: Report from renal ultrasound dated 03/29/2011. It is noted that the patient's prior imaging studies are not currently merged with this CT.  The CT technologist has been notified and will leave a message for IT to merge this study with prior imaging studies.  Findings: At the lung bases, the heart size appears mildly enlarged,  but is not  completely included in the imaging field. A small pericardial effusion is seen anteriorly.  There is a small left pleural effusion. Left basilar atelectasis is present.  There is a moderate degree of abdominal ascites, and a large volume of pelvic ascites.  There are no prior studies of the abdomen pelvis for comparison.  The noncontrast appearance of the liver, gallbladder, spleen, adrenal glands, pancreas, and kidneys is within normal limits. Both kidneys are normal in size and parenchymal thickness.  No urinary tract stones are identified.  No evidence of ureteral dilatation; the ureters are difficult to follow in their entirety due to the ascites.  The patient was given oral contrast, which has progressed to the level of the cecum at the time of the exam. In the right aspect of the pelvis, there is circumferentially thickened of the distal ileum, with a somewhat nodular and thickened wall.  Wall thickness measures up to 10 mm. No definite thickening of the terminal ileum. The appendix is seen on images 52-55 and is normal.  Proximal small bowel loops appear normal in wall thickness.  No evidence of bowel obstruction.  Moderate amount of stool in the sigmoid colon and rectum.  Urinary bladder, uterus, and ovaries within normal limits for noncontrast CT.  No definite pathologic lymphadenopathy, although evaluation is limited without intravenous contrast, and due to obscuration of the fat planes about the ascites.  No acute or suspicious bony abnormality.  Sacroiliac joints within normal limits.  The  IMPRESSION: 1.  Moderate to large volume of abdominal and pelvic ascites.   2.  Markedly abnormal circumferential wall thickening of the distal ileum without definite involvement of the terminal ileum. Findings could be due to inflammatory bowel disease or infectious ileitis.  Small bowel lymphoma or small bowel hemorrhage would be considered less likely. 3.  Small left pleural effusion. 4.  Small pericardial  effusion. 5.  Suspect cardiomegaly, although heart is not completely included on the image.  Original Report Authenticated By: Britta Mccreedy, M.D.    Review of Systems  Constitutional: Negative for fever.  HENT: Negative.   Eyes: Negative.   Respiratory: Positive for shortness of breath. Negative for cough and wheezing.   Cardiovascular: Negative.   Gastrointestinal: Positive for nausea, abdominal pain and diarrhea. Negative for constipation, blood in stool and melena.  Genitourinary: Negative.   Musculoskeletal: Positive for myalgias.  Skin: Negative.   Neurological: Positive for weakness.  Endo/Heme/Allergies: Negative.   Psychiatric/Behavioral: Negative.     Blood pressure 142/91, pulse 72, temperature 96.5 F (35.8 C), temperature source Axillary, resp. rate 18, height 5\' 2"  (1.575 m), weight 53.3 kg (117 lb 8.1 oz), last menstrual period 05/24/2011, SpO2 98.00%. Physical Exam  Constitutional: She is oriented to person, place, and time. She appears well-developed and well-nourished.  HENT:  Head: Normocephalic and atraumatic.  Right Ear: External ear normal.  Left Ear: External ear normal.  Nose: Nose normal.  Mouth/Throat: Oropharynx is clear and moist.  Eyes: Conjunctivae and EOM are normal. Pupils are equal, round, and reactive to light.  Neck: Normal range of motion. Neck supple.  Cardiovascular: Normal rate, regular rhythm, normal heart sounds and intact distal pulses.   Respiratory: Effort normal and breath sounds normal.  GI: Soft. Bowel sounds are normal. She exhibits distension. She exhibits no mass. There is tenderness. There is no rebound and no guarding.  Musculoskeletal: Normal range of motion.  Neurological: She is alert and oriented to person, place, and time. She has normal  reflexes.  Skin: Skin is warm and dry.  Psychiatric: She has a normal mood and affect. Her behavior is normal. Judgment and thought content normal.     Assessment/Plan a 21 year old  female being admitted with ileitis, with chronic kidney disease, profound anemia but guaiac-negative. Her index is probably infectious but could also be ischemic, IBD or any other inflammatory process. Plan #1 Ileitis: Patient will be admitted place on bowel rest, get blood cultures as well as empiric Cipro and Flagyl. We will hydrate her aggressively. If symptoms do not improve we will get GI consult. #2 Chronic kidney disease: Secondary to resent Nephritis. Renal function seemed to be at baseline. Nephrology is already aware of patient's admission and will follow with [place]. #3 anemia: The is no evidence of acute blood loss. More than likely this is secondary to her renal disease. We will transfuse patient and his 1 unit of packed red blood cells. Check anemia panel and thought to nephrology for further treatment. We'll continue to check her stool guaiacs and if it is positive we'll get GI to work up patient. #4 hypertension: Continue hier home medications if tolerated #5 hypoalbuminemia: Probably from renal disease. We will encourage increased protein intake once stable   Tekia Waterbury,LAWAL 08/25/2011, 4:46 AM

## 2011-08-25 NOTE — Consult Note (Signed)
Referring Provider: Dr. David Stall Primary Care Physician:  Jaclyn Shaggy, MD, MD Primary Gastroenterologist:  Gentry Fitz  Reason for Consultation:  Heather Sims; black stools  HPI: Heather Sims is a 21 y.o. female recently diagnosed with glomerulonephritis (MPGN) who was admitted with abdominal pain that is diffuse and started over the weekend. She developed watery diarrhea Saturday per her mother, which started one day after being given Kayexalate by her nephrologist for hyperkalemia. She has had 2 watery stools today per nursing. Denies any rectal bleeding. +N without vomiting. Report of black stools but on iron pills. No hematochezia. CT showed thickening of the distal ileum without involvement seen of the terminal ileum. Mother denies that patient has had these diarrhea symptoms prior to Saturday. Denies weight loss. Patient awake but unable to give me any history and just received pain meds.    Past Medical History  Diagnosis Date  . Pneumonia last 2 weeks    'walking pneumonia'  . Seizures   . Chronic kidney disease   . Anemia   . Heart attack   . Hypertension   . Renal disorder   . Cardiomyopathy   . Heart failure   . CHF (congestive heart failure)   . MPGN (membranoproliferative glomerulonephritis), type 2   . Ileitis     Past Surgical History  Procedure Date  . Renal biopsy     Prior to Admission medications   Medication Sig Start Date End Date Taking? Authorizing Provider  amLODipine (NORVASC) 10 MG tablet Take 10 mg by mouth daily.   Yes Historical Provider, MD  clonazePAM (KLONOPIN) 0.5 MG tablet Take 0.5 mg by mouth 2 (two) times daily.  05/26/11  Yes Daniel J Angiulli, PA  cloNIDine (CATAPRES - DOSED IN MG/24 HR) 0.3 mg/24hr Place 1 patch onto the skin once a week. Apply on Tuesdays.   Yes Historical Provider, MD  divalproex (DEPAKOTE) 500 MG DR tablet Take 500 mg by mouth 3 (three) times daily. 08/11/11  Yes Ranelle Oyster, MD  ferrous sulfate 325 (65 FE) MG  tablet Take 325 mg by mouth 3 (three) times daily.   Yes Historical Provider, MD  furosemide (LASIX) 80 MG tablet Take 40 mg by mouth every evening.   Yes Historical Provider, MD  isosorbide mononitrate (IMDUR) 60 MG 24 hr tablet Take 60 mg by mouth daily. 07/03/11  Yes Ranelle Oyster, MD  lisinopril (PRINIVIL,ZESTRIL) 5 MG tablet Take 5 mg by mouth every evening.   Yes Historical Provider, MD  metoprolol (LOPRESSOR) 50 MG tablet Take 50 mg by mouth 2 (two) times daily.   Yes Historical Provider, MD    Scheduled Meds:   . sodium chloride   Intravenous STAT  . amLODipine  10 mg Oral Daily  . ciprofloxacin  400 mg Intravenous QHS  . clonazePAM  0.5 mg Oral BID  . cloNIDine  0.3 mg Transdermal Weekly  . divalproex  500 mg Oral TID  . ferrous sulfate  325 mg Oral TID WC  . HYDROmorphone  1 mg Intravenous Once  . iohexol  20 mL Oral Q1 Hr x 2  . isosorbide mononitrate  60 mg Oral Daily  . lisinopril  5 mg Oral QHS  . metoprolol  50 mg Oral BID  . metronidazole  500 mg Intravenous Q8H  . ondansetron  4 mg Intravenous Once  . sodium chloride  250 mL Intravenous Once  . DISCONTD: enoxaparin  30 mg Subcutaneous Daily   Continuous Infusions:   . sodium chloride  100 mL/hr at 08/25/11 1306  . DISCONTD: dextrose 5 % and 0.45% NaCl     PRN Meds:.HYDROmorphone (DILAUDID) injection, ondansetron (ZOFRAN) IV, ondansetron  Allergies as of 08/24/2011  . (No Known Allergies)    History reviewed. No pertinent family history.  History   Social History  . Marital Status: Single    Spouse Name: N/A    Number of Children: N/A  . Years of Education: N/A   Occupational History  . Not on file.   Social History Main Topics  . Smoking status: Never Smoker   . Smokeless tobacco: Not on file  . Alcohol Use: No  . Drug Use: No  . Sexually Active: No   Other Topics Concern  . Not on file   Social History Narrative   ** Merged History Encounter **     Review of Systems: All negative  except as stated above in HPI.  Physical Exam: Vital signs: Filed Vitals:   08/25/11 1315  BP: 158/108  Pulse: 76  Temp: 97.9 F (36.6 C)  Resp: 18   Last BM Date: 08/25/11 General:   Thin, lethargic, no acute distress  Lungs:  Clear throughout to auscultation. Heart:  Regular rate and rhythm Abdomen: diffuse tenderness with minimal guarding, distended, positive bowel sounds  Rectal:  Deferred  GI:  Lab Results:  Basename 08/25/11 0605 08/24/11 2349 08/24/11 1601  WBC 14.3* 12.2* 12.8*  HGB 8.0* 5.8* 6.1*  HCT 23.0* 16.7* 17.3*  PLT 104* 79* 93*   BMET  Basename 08/25/11 0605 08/24/11 2349 08/24/11 1601  NA 137 -- 135  K 3.9 -- 3.9  CL 102 -- 102  CO2 21 -- 21  GLUCOSE 82 -- 103*  BUN 57* -- 59*  CREATININE 2.21* 2.26* 2.42*  CALCIUM 8.8 -- 8.7   LFT  Basename 08/25/11 0605 08/24/11 1740  PROT 6.3 --  ALBUMIN 2.4* --  AST 18 --  ALT 6 --  ALKPHOS 39 --  BILITOT 0.3 --  BILIDIR -- <0.1  IBILI -- NOT CALCULATED   PT/INR No results found for this basename: LABPROT:2,INR:2 in the last 72 hours   Studies/Results: Ct Abdomen Pelvis Wo Contrast  08/24/2011  *RADIOLOGY REPORT*  Clinical Data: Abdominal distention and weakness.  History of renal disease.  CT ABDOMEN AND PELVIS WITHOUT CONTRAST  Technique:  Multidetector CT imaging of the abdomen and pelvis was performed following the standard protocol without intravenous contrast.  Comparison: Report from renal ultrasound dated 03/29/2011. It is noted that the patient's prior imaging studies are not currently merged with this CT.  The CT technologist has been notified and will leave a message for IT to merge this study with prior imaging studies.  Findings: At the lung bases, the heart size appears mildly enlarged, but is not completely included in the imaging field. A small pericardial effusion is seen anteriorly.  There is a small left pleural effusion. Left basilar atelectasis is present.  There is a moderate degree  of abdominal ascites, and a large volume of pelvic ascites.  There are no prior studies of the abdomen pelvis for comparison.  The noncontrast appearance of the liver, gallbladder, spleen, adrenal glands, pancreas, and kidneys is within normal limits. Both kidneys are normal in size and parenchymal thickness.  No urinary tract stones are identified.  No evidence of ureteral dilatation; the ureters are difficult to follow in their entirety due to the ascites.  The patient was given oral contrast, which has progressed to the level of  the cecum at the time of the exam. In the right aspect of the pelvis, there is circumferentially thickened of the distal ileum, with a somewhat nodular and thickened wall.  Wall thickness measures up to 10 mm. No definite thickening of the terminal ileum. The appendix is seen on images 52-55 and is normal.  Proximal small bowel loops appear normal in wall thickness.  No evidence of bowel obstruction.  Moderate amount of stool in the sigmoid colon and rectum.  Urinary bladder, uterus, and ovaries within normal limits for noncontrast CT.  No definite pathologic lymphadenopathy, although evaluation is limited without intravenous contrast, and due to obscuration of the fat planes about the ascites.  No acute or suspicious bony abnormality.  Sacroiliac joints within normal limits.  The  IMPRESSION: 1.  Moderate to large volume of abdominal and pelvic ascites.   2.  Markedly abnormal circumferential wall thickening of the distal ileum without definite involvement of the terminal ileum. Findings could be due to inflammatory bowel disease or infectious ileitis.  Small bowel lymphoma or small bowel hemorrhage would be considered less likely. 3.  Small left pleural effusion. 4.  Small pericardial effusion. 5.  Suspect cardiomegaly, although heart is not completely included on the image.  Original Report Authenticated By: Britta Mccreedy, M.D.    Impression/Plan: 21yo with MPGN who was treated for  hyperkalemia last Friday with Kayexalate and developed diarrhea and abdominal pain the next day. CT shows ileitis with sparing of the terminal ileum. Acute onset of her symptoms suggests this is an infectious process. Doubt relation of the kidney disease to the CT findings of the small bowel. Doubt Crohn's disease with the acute onset of the symptoms. Suspect diarrhea due to infection and also due to her recent Kayexalate. Black stools probably due to iron pills. Low Hgb likely due to renal disease. No evidence of a GI bleed. Check stool studies for infection. Hold off on any GI procedures at this time. Will follow.     LOS: 1 day   Alda Gaultney C.  08/25/2011, 1:22 PM

## 2011-08-25 NOTE — Care Management Note (Signed)
    Page 1 of 1   08/28/2011     12:00:14 PM   CARE MANAGEMENT NOTE 08/28/2011  Patient:  Heather Sims, Heather Sims   Account Number:  [id number]  Date Initiated:  08/25/2011  Documentation initiated by:  Letha Cape  Subjective/Objective Assessment:   dx ileits  admit- lives with parent.     Action/Plan:   Anticipated DC Date:  08/28/2011   Anticipated DC Plan:  HOME/SELF CARE      DC Planning Services  CM consult      Choice offered to / List presented to:             Status of service:  Completed, signed off Medicare Important Message given?   (If response is "NO", the following Medicare IM given date fields will be blank) Date Medicare IM given:   Date Additional Medicare IM given:    Discharge Disposition:  HOME/SELF CARE  Per UR Regulation:  Reviewed for med. necessity/level of care/duration of stay  If discussed at Long Length of Stay Meetings, dates discussed:    Comments:  08/28/11 11:57 Letha Cape RN, [phone] patient dc to home today,  called [facility] at [phone] , they stated they have the Coreg 25mg  for patient and they are filling her scripts now, informed the RN who is taking care of dc instructions.  08/25/11 17:04 Letha Cape RN, [name] [phone] patient lives with parents, GI consulted.  NCM will continue to follow for dc needs.

## 2011-08-26 DIAGNOSIS — N189 Chronic kidney disease, unspecified: Secondary | ICD-10-CM

## 2011-08-26 DIAGNOSIS — D649 Anemia, unspecified: Secondary | ICD-10-CM

## 2011-08-26 DIAGNOSIS — K5000 Crohn's disease of small intestine without complications: Secondary | ICD-10-CM

## 2011-08-26 DIAGNOSIS — I10 Essential (primary) hypertension: Secondary | ICD-10-CM

## 2011-08-26 LAB — CBC
MCHC: 34.2 g/dL (ref 30.0–36.0)
RDW: 19.1 % — ABNORMAL HIGH (ref 11.5–15.5)
WBC: 10.8 10*3/uL — ABNORMAL HIGH (ref 4.0–10.5)

## 2011-08-26 LAB — URINE CULTURE

## 2011-08-26 LAB — RENAL FUNCTION PANEL
Albumin: 2.2 g/dL — ABNORMAL LOW (ref 3.5–5.2)
Calcium: 8.3 mg/dL — ABNORMAL LOW (ref 8.4–10.5)
GFR calc Af Amer: 37 mL/min — ABNORMAL LOW (ref >90)
Phosphorus: 5.2 mg/dL — ABNORMAL HIGH (ref 2.3–4.6)
Potassium: 4.1 mEq/L (ref 3.5–5.1)
Sodium: 136 mEq/L (ref 135–145)

## 2011-08-26 LAB — PROTIME-INR: Prothrombin Time: 15.2 seconds (ref 11.6–15.2)

## 2011-08-26 LAB — ANA: Anti Nuclear Antibody(ANA): NEGATIVE

## 2011-08-26 LAB — CLOSTRIDIUM DIFFICILE BY PCR: Toxigenic C. Difficile by PCR: POSITIVE — AB

## 2011-08-26 MED ORDER — OXYCODONE HCL 5 MG PO TABS
5.0000 mg | ORAL_TABLET | ORAL | Status: DC | PRN
  Administered 2011-08-26 – 2011-08-27 (×3): 5 mg via ORAL
  Filled 2011-08-26 (×3): qty 1

## 2011-08-26 MED ORDER — FUROSEMIDE 40 MG PO TABS
40.0000 mg | ORAL_TABLET | Freq: Every day | ORAL | Status: DC
  Administered 2011-08-26 – 2011-08-28 (×3): 40 mg via ORAL
  Filled 2011-08-26 (×3): qty 1

## 2011-08-26 MED ORDER — LORAZEPAM 2 MG/ML IJ SOLN: 1.0000 mg | Freq: Once | INTRAMUSCULAR | Status: DC

## 2011-08-26 MED ORDER — LORAZEPAM 2 MG/ML IJ SOLN
INTRAMUSCULAR | Status: AC
  Administered 2011-08-26: 1 mg
  Filled 2011-08-26: qty 1

## 2011-08-26 MED ORDER — HYDRALAZINE HCL 20 MG/ML IJ SOLN
10.0000 mg | Freq: Four times a day (QID) | INTRAMUSCULAR | Status: DC | PRN
  Administered 2011-08-26 – 2011-08-27 (×3): 10 mg via INTRAVENOUS
  Filled 2011-08-26 (×3): qty 0.5

## 2011-08-26 MED ORDER — CLONIDINE HCL 0.1 MG PO TABS
0.1000 mg | ORAL_TABLET | ORAL | Status: DC | PRN
  Filled 2011-08-26: qty 1

## 2011-08-26 MED ORDER — DARBEPOETIN ALFA-POLYSORBATE 60 MCG/0.3ML IJ SOLN
60.0000 ug | INTRAMUSCULAR | Status: DC
  Administered 2011-08-26: 60 ug via SUBCUTANEOUS
  Filled 2011-08-26: qty 0.3

## 2011-08-26 NOTE — Progress Notes (Signed)
Patient ID: Heather Sims, female   DOB: 1990-09-16, 21 y.o.   MRN: [id number]  PATIENT DETAILS Name: Heather Sims Age: 21 y.o. Sex: female Date of Birth: [birth year] Admit Date: 08/24/2011 NWG:NFAO, Odette Horns, MD, MD  Interim History: This is a 21 year old female with a complex past medical history that started in January 2013 with cardiac and respiratory arrest.  When she awoke she was found to have anoxic brain injury, seizures, severe kidney disease and CHF.  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  She presented to the ED with intermittent abdominal pain since Saturday (6/1). She was seen by her nephrologist on Friday and was diagnosed with hyperkalemia. She was given Kayexalate. By Saturday morning she started having abdominal pain and diarrhea. The abdominal pain is centrally located rated as 8/10 with no radiation, mild nausea but not vomiting. She reports black tarry stools (but was on iron). In the ED where her hemoglobin was found to be  6 and she had findings suggestive of ileitis on her CT scan. She denies fever or chills, no sick contacts, no history of inflammatory bowel disease.  She has received one unit of PRBCs overnight.  Consultations: 1.  Gastroenterology, [facility] 2.  [facility]  Procedures: 1.  Blood transfusion.  1 unit PRBCs the night of admission, and 2 more units 08/26/11    Subjective:   Still with abdominal pain in periumbilical area.  Objective: Weight change:   Intake/Output Summary (Last 24 hours) at 08/26/11 1526 Last data filed at 08/26/11 1244  Gross per 24 hour  Intake   4638 ml  Output      0 ml  Net   4638 ml   Blood pressure 129/85, pulse 74, temperature 97.5 F (36.4 C), temperature source Oral, resp. rate 18, height 5\' 2"  (1.575 m), weight 53.3 kg (117 lb 8.1 oz), last menstrual period 05/24/2011, SpO2 98.00%. Filed Vitals:   08/26/11  1244 08/26/11 1344 08/26/11 1444 08/26/11 1517  BP: 144/98 134/94 129/88 129/85  Pulse: 75 76 73 74  Temp: 98.6 F (37 C) 97.8 F (36.6 C) 97.3 F (36.3 C) 97.5 F (36.4 C)  TempSrc: Oral Oral Oral Oral  Resp: 18 18 18 18   Height:      Weight:      SpO2:        Physical Exam: General: appears youthful but chronically ill.  Quiet.  Appears in pain. Lungs: Clear to auscultation bilaterally without wheezes or crackles Cardiovascular: Regular rate and rhythm without murmur gallop or rub normal S1 and S2 Abdomen: more distended today, soft, TTP just right and below umbilicus, soft, bowel sounds positive Extremities: No significant cyanosis, clubbing, or edema bilateral lower extremities  C-Diff PCR: Positive Other stool studies still pending. (O&P and stool culture)  Basic Metabolic Panel:  Lab 08/26/11 1308 08/25/11 0605  NA 136 137  K 4.1 3.9  CL 106 102  CO2 20 21  GLUCOSE 77 82  BUN 51* 57*  CREATININE 2.12* 2.21*  CALCIUM 8.3* 8.8  MG -- --  PHOS 5.2* --   Liver Function Tests:  Lab 08/26/11 0555 08/25/11 0605 08/24/11 1740  AST -- 18 18  ALT -- 6 5  ALKPHOS -- 39 43  BILITOT -- 0.3 0.2*  PROT -- 6.3 6.0  ALBUMIN 2.2* 2.4* --    Lab 08/24/11 1740  LIPASE 15  AMYLASE --  CBC:  Lab 08/26/11 0555 08/25/11 0605 08/24/11 1601  WBC 10.8* 14.3* --  NEUTROABS -- -- 9.1*  HGB 6.6* 8.0* --  HCT 19.3* 23.0* --  MCV 87.3 86.5 --  PLT 115* 104* --   Anemia Panel:  Lab 08/25/11 0605  VITAMINB12 1076*  FOLATE 5.9  FERRITIN 360*  TIBC 215*  IRON 47  RETICCTPCT 4.4*   Urine Drug Screen: Drugs of Abuse     Component Value Date/Time   LABOPIA NEGATIVE 03/27/2011 0300   LABOPIA NONE DETECTED 03/26/2011 2041   COCAINSCRNUR NEGATIVE 03/27/2011 0300   COCAINSCRNUR NONE DETECTED 03/26/2011 2041   LABBENZ NEGATIVE 03/27/2011 0300   LABBENZ NONE DETECTED 03/26/2011 2041   AMPHETMU NEGATIVE 03/27/2011 0300   AMPHETMU NONE DETECTED 03/26/2011 2041   THCU NONE DETECTED  03/26/2011 2041   LABBARB NONE DETECTED 03/26/2011 2041     Studies/Results:  CT Abd/Pelvis IMPRESSION:  1. Moderate to large volume of abdominal and pelvic ascites.  2. Markedly abnormal circumferential wall thickening of the distal ileum without definite involvement of the terminal ileum.  Findings could be due to inflammatory bowel disease or infectious ileitis. Small bowel lymphoma or small bowel hemorrhage would be considered less likely.  3. Small left pleural effusion.  4. Small pericardial effusion.  5. Suspect cardiomegaly, although heart is not completely included on the image.    Scheduled Meds:    . amLODipine  10 mg Oral Daily  . ciprofloxacin  400 mg Intravenous QHS  . clonazePAM  0.5 mg Oral BID  . cloNIDine  0.3 mg Transdermal Weekly  . darbepoetin (ARANESP) injection - NON-DIALYSIS  60 mcg Subcutaneous Q Wed-1800  . divalproex  500 mg Oral TID  . ferrous sulfate  325 mg Oral TID WC  . furosemide  40 mg Oral Daily  . isosorbide mononitrate  60 mg Oral Daily  . metoprolol  50 mg Oral BID  . metronidazole  500 mg Intravenous Q8H  . DISCONTD: lisinopril  5 mg Oral QHS   Continuous Infusions:    . DISCONTD: sodium chloride 100 mL/hr at 08/26/11 0512   PRN Meds:.cloNIDine, hydrALAZINE, HYDROmorphone (DILAUDID) injection, ondansetron (ZOFRAN) IV, ondansetron, oxyCODONE  Anti-infectives:  Anti-infectives     Start     Dose/Rate Route Frequency Ordered Stop   08/25/11 2359   ciprofloxacin (CIPRO) IVPB 400 mg        400 mg 200 mL/hr over 60 Minutes Intravenous Daily at bedtime 08/24/11 2250     08/24/11 2359   metroNIDAZOLE (FLAGYL) IVPB 500 mg        500 mg 100 mL/hr over 60 Minutes Intravenous 3 times per day 08/24/11 2250            Assessment/Plan: Principal Problem:  *Ileitis Active Problems:  Anemia  Hypertension  CKD (chronic kidney disease)  Hypoalbuminemia   1.  Ileitis secondary to C-diff colitis Abdominal pain and distention with  circumferential thickening of the ileum on CT.   [facility] consulted. Appreciate their recommendations.  Will discontinue cipro and continue Flagyl for C-diff.  2.  Microcytic anemia.   Per Renal records her Hgb was 8 in April and 6.4 on 5/17.   Patient reports black tarry tools but has been on iron.  She received 3 units of PRBCsfor a hgb of 5.8. Likely her severe kidney disease is contributing to her anemia.  Dr. Briant Cedar is started on Epo which will be continued outpatient.  3.  MPGN. Renal is on board.  Appreciate  their recommendations very much.  4.  Hypertension.   On Amlodipine, Clonidine, metoprolol, Imdur, lasix.  Hydralazine PRN added. Better Control.  Will montor.  Lisinopril d/c per Renal.  DVT Prophylaxis:  Will D/C lovenox and start SCDs.   LOS: 2 days   Stephani Police 08/26/2011, 3:26 PM [phone]  Attending - I have seen and examined the patient, I agree with the assessment and plan as outlined above  Windell Norfolk MD

## 2011-08-26 NOTE — Progress Notes (Signed)
Nutrition Brief Note:  RD consulted for diet education, low K+ and low phos. Pt was sleeping at time of RD visit. Pt woke to name call but quickly fell back asleep. RD left renal Food Guide Pyramid with nutrient information at bedside. RD will follow up with pt more appropriate for education.   Heather Sims

## 2011-08-26 NOTE — Progress Notes (Signed)
Called into patient room at 1925 by visitor across the hall. Pt was found having a seizure in all extremity.  Vitals:  (1925)BP: 226/159, O2 sat 99%, HR: 136, R: 24  (1930): BP: 189/123, O2 sat 97%, HR: 116, R: 22  (1940) BP: 186/133, O2 sat 99%, HR: 96, R:20  (1942) BP: 174/120, O2 sat 97%, HR 91, R: 20 Pt given Ativan 1mg  and Hydralazine 10mg .

## 2011-08-26 NOTE — Progress Notes (Signed)
CRITICAL VALUE ALERT  Critical value received:  K: 6.6  Date of notification:  08/26/2011   Time of notification:  7:14 AM   Critical value read back:yes  Nurse who received alert:  Kennyth Arnold, RN  MD notified (1st page):  Ghimire  Time of first page:  7:15 AM   MD notified (2nd page):  Time of second page:  Responding MD:  Algis Downs, PA-C  Time MD responded: [phone]

## 2011-08-26 NOTE — Consult Note (Signed)
Heather Sims is an 21 y.o. female referred by Dr Jerral Ralph   Chief Complaint: CKD3 Sec C3 nephropathy and anemia HPI: 21yo BF with CKD3 and baseline Scr 2's admitted with acute onset diarrhea and abd pain 08/24/11.  Sxs began 6/1 after taking kayexalate for hyperkalemia.  Ct abd shows signs of ileitis and ascites.  Had been on minoxidil until 2 weeks ago.  Hg was low in January and was 10-11 range at DC.  In April HG was 8.5 and on 08/07/11 Hg was 6.4.  She denies any overt blood loss (no hematochezia, no menstrual cycles).  Stool for C Diff +  Past Medical History  Diagnosis Date  . Pneumonia last 2 weeks    'walking pneumonia'  . Seizures   . Chronic kidney disease   . Anemia   . Heart attack   . Hypertension   . Renal disorder   . Cardiomyopathy   . Heart failure   . CHF (congestive heart failure)   . MPGN (membranoproliferative glomerulonephritis), type 2   . Ileitis     Past Surgical History  Procedure Date  . Renal biopsy     History reviewed. No pertinent family history. Social History:  reports that she has never smoked. She does not have any smokeless tobacco history on file. She reports that she does not drink alcohol or use illicit drugs.  Allergies: No Known Allergies  Medications Prior to Admission  Medication Sig Dispense Refill  . amLODipine (NORVASC) 10 MG tablet Take 10 mg by mouth daily.      . clonazePAM (KLONOPIN) 0.5 MG tablet Take 0.5 mg by mouth 2 (two) times daily.       . cloNIDine (CATAPRES - DOSED IN MG/24 HR) 0.3 mg/24hr Place 1 patch onto the skin once a week. Apply on Tuesdays.      . divalproex (DEPAKOTE) 500 MG DR tablet Take 500 mg by mouth 3 (three) times daily.      . ferrous sulfate 325 (65 FE) MG tablet Take 325 mg by mouth 3 (three) times daily.      . furosemide (LASIX) 80 MG tablet Take 40 mg by mouth every evening.      . isosorbide mononitrate (IMDUR) 60 MG 24 hr tablet Take 60 mg by mouth daily.      Marland Kitchen lisinopril (PRINIVIL,ZESTRIL) 5  MG tablet Take 5 mg by mouth every evening.      . metoprolol (LOPRESSOR) 50 MG tablet Take 50 mg by mouth 2 (two) times daily.         Lab Results: UA: 11-20 wbc, 21-50 rbc, + protein  Basename 08/26/11 0555 08/25/11 0605 08/24/11 2349  WBC 10.8* 14.3* 12.2*  HGB 6.6* 8.0* 5.8*  HCT 19.3* 23.0* 16.7*  PLT 115* 104* 79*   BMET  Basename 08/26/11 0555 08/25/11 0605 08/24/11 2349 08/24/11 1601  NA 136 137 -- 135  K 4.1 3.9 -- 3.9  CL 106 102 -- 102  CO2 20 21 -- 21  GLUCOSE 77 82 -- 103*  BUN 51* 57* -- 59*  CREATININE 2.12* 2.21* 2.26* --  CALCIUM 8.3* 8.8 -- 8.7  PHOS 5.2* -- -- --   LFT  Basename 08/26/11 0555 08/25/11 0605 08/24/11 1740  PROT -- 6.3 --  ALBUMIN 2.2* -- --  AST -- 18 --  ALT -- 6 --  ALKPHOS -- 39 --  BILITOT -- 0.3 --  BILIDIR -- -- <0.1  IBILI -- -- NOT CALCULATED   Ct  Abdomen Pelvis Wo Contrast  08/24/2011  *RADIOLOGY REPORT*  Clinical Data: Abdominal distention and weakness.  History of renal disease.  CT ABDOMEN AND PELVIS WITHOUT CONTRAST  Technique:  Multidetector CT imaging of the abdomen and pelvis was performed following the standard protocol without intravenous contrast.  Comparison: Report from renal ultrasound dated 03/29/2011. It is noted that the patient's prior imaging studies are not currently merged with this CT.  The CT technologist has been notified and will leave a message for IT to merge this study with prior imaging studies.  Findings: At the lung bases, the heart size appears mildly enlarged, but is not completely included in the imaging field. A small pericardial effusion is seen anteriorly.  There is a small left pleural effusion. Left basilar atelectasis is present.  There is a moderate degree of abdominal ascites, and a large volume of pelvic ascites.  There are no prior studies of the abdomen pelvis for comparison.  The noncontrast appearance of the liver, gallbladder, spleen, adrenal glands, pancreas, and kidneys is within normal  limits. Both kidneys are normal in size and parenchymal thickness.  No urinary tract stones are identified.  No evidence of ureteral dilatation; the ureters are difficult to follow in their entirety due to the ascites.  The patient was given oral contrast, which has progressed to the level of the cecum at the time of the exam. In the right aspect of the pelvis, there is circumferentially thickened of the distal ileum, with a somewhat nodular and thickened wall.  Wall thickness measures up to 10 mm. No definite thickening of the terminal ileum. The appendix is seen on images 52-55 and is normal.  Proximal small bowel loops appear normal in wall thickness.  No evidence of bowel obstruction.  Moderate amount of stool in the sigmoid colon and rectum.  Urinary bladder, uterus, and ovaries within normal limits for noncontrast CT.  No definite pathologic lymphadenopathy, although evaluation is limited without intravenous contrast, and due to obscuration of the fat planes about the ascites.  No acute or suspicious bony abnormality.  Sacroiliac joints within normal limits.  The  IMPRESSION: 1.  Moderate to large volume of abdominal and pelvic ascites.   2.  Markedly abnormal circumferential wall thickening of the distal ileum without definite involvement of the terminal ileum. Findings could be due to inflammatory bowel disease or infectious ileitis.  Small bowel lymphoma or small bowel hemorrhage would be considered less likely. 3.  Small left pleural effusion. 4.  Small pericardial effusion. 5.  Suspect cardiomegaly, although heart is not completely included on the image.  Original Report Authenticated By: Britta Mccreedy, M.D.    ROS: Appetite has been good Some fatigue No SOB NO CP Mild abd pain No dysuria CO hirsuitism from minoxidil  PHYSICAL EXAM: Blood pressure 149/88, pulse 80, temperature 98.3 F (36.8 C), temperature source Oral, resp. rate 18, height 5\' 2"  (1.575 m), weight 53.3 kg (117 lb 8.1 oz), last  menstrual period 05/24/2011, SpO2 98.00%. HEENT: PERRLA EOMI.  Excessive hair on forehead NECK:no JVD LUNGS:clear CARDIAC:RRR wo MRG ABD:+BS Mild distention, mild abd tenderness in lower abd EXT:no edema NEURO:CNI M&S intact Ox3  Assessment: 1. C. DIff colitis 2. CKD3 sec C3 nephropathy, renal fx stable 3. Anemia  ? Component EPO deficiency 4. HTN PLAN: 1 Rec DC cipro 2. Rec starting metronidazole 3. Dietitian to see for education on low K, low PO4 diet 4. Leave off lisinopril for now.  She may do better with ARB long  term from a K standpoint 4. Start Aranesp  This will need to be continued as outpt 5. Oral clonidine prn for now 6.  Might consider evaluating her for other causes of anemia ie sickle cell etc if not already done   Edris Schneck T 08/26/2011, 12:24 PM

## 2011-08-27 ENCOUNTER — Inpatient Hospital Stay (HOSPITAL_COMMUNITY): Payer: Medicaid Other

## 2011-08-27 DIAGNOSIS — D649 Anemia, unspecified: Secondary | ICD-10-CM

## 2011-08-27 DIAGNOSIS — I10 Essential (primary) hypertension: Secondary | ICD-10-CM

## 2011-08-27 DIAGNOSIS — R569 Unspecified convulsions: Secondary | ICD-10-CM

## 2011-08-27 DIAGNOSIS — N189 Chronic kidney disease, unspecified: Secondary | ICD-10-CM

## 2011-08-27 DIAGNOSIS — K5000 Crohn's disease of small intestine without complications: Secondary | ICD-10-CM

## 2011-08-27 LAB — VALPROIC ACID LEVEL: Valproic Acid Lvl: 67.4 ug/mL (ref 50.0–100.0)

## 2011-08-27 LAB — CBC
HCT: 31.4 % — ABNORMAL LOW (ref 36.0–46.0)
MCH: 30.2 pg (ref 26.0–34.0)
MCV: 87.7 fL (ref 78.0–100.0)
Platelets: 149 10*3/uL — ABNORMAL LOW (ref 150–400)
RBC: 3.58 MIL/uL — ABNORMAL LOW (ref 3.87–5.11)

## 2011-08-27 LAB — TYPE AND SCREEN
ABO/RH(D): O POS
Antibody Screen: NEGATIVE
Unit division: 0

## 2011-08-27 LAB — RENAL FUNCTION PANEL
BUN: 49 mg/dL — ABNORMAL HIGH (ref 6–23)
CO2: 18 mEq/L — ABNORMAL LOW (ref 19–32)
Calcium: 8.7 mg/dL (ref 8.4–10.5)
Creatinine, Ser: 2.07 mg/dL — ABNORMAL HIGH (ref 0.50–1.10)
GFR calc non Af Amer: 33 mL/min — ABNORMAL LOW (ref >90)

## 2011-08-27 MED ORDER — DIVALPROEX SODIUM 500 MG PO DR TAB
750.0000 mg | DELAYED_RELEASE_TABLET | Freq: Three times a day (TID) | ORAL | Status: DC
  Administered 2011-08-27 – 2011-08-28 (×2): 750 mg via ORAL
  Filled 2011-08-27 (×4): qty 1

## 2011-08-27 MED ORDER — CARVEDILOL 12.5 MG PO TABS
12.5000 mg | ORAL_TABLET | Freq: Two times a day (BID) | ORAL | Status: DC
  Administered 2011-08-27 – 2011-08-28 (×3): 12.5 mg via ORAL
  Filled 2011-08-27 (×5): qty 1

## 2011-08-27 MED ORDER — VALPROATE SODIUM 500 MG/5ML IV SOLN
750.0000 mg | Freq: Once | INTRAVENOUS | Status: AC
  Administered 2011-08-27: 750 mg via INTRAVENOUS
  Filled 2011-08-27: qty 7.5

## 2011-08-27 MED ORDER — DIVALPROEX SODIUM 250 MG PO DR TAB
250.0000 mg | DELAYED_RELEASE_TABLET | Freq: Once | ORAL | Status: AC
  Administered 2011-08-27: 250 mg via ORAL
  Filled 2011-08-27: qty 1

## 2011-08-27 MED ORDER — METRONIDAZOLE 500 MG PO TABS
500.0000 mg | ORAL_TABLET | Freq: Three times a day (TID) | ORAL | Status: DC
  Administered 2011-08-27 – 2011-08-28 (×3): 500 mg via ORAL
  Filled 2011-08-27 (×7): qty 1

## 2011-08-27 NOTE — Consult Note (Signed)
[facility] CONSULT NOTE     Reason for Consult: seizure    HPI:    Heather Sims is an 21 y.o. female  Who in January 2013 had respiratory and cardiac arrest.  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 Patient was on Depakote 500 mg TID PO at home. Per chart her kidney disease has been further specified to be MPGN2 with C3 deposits and was felt to have contributed to her initial arrest. Patient presented to the ED 08/24/11 after having some abdominal pain since Saturday. Per note in chart she was seen by her nephrologist on Friday and was diagnosed with hyperkalemia and given Kayexalate . By Saturday morning she started having abdominal pain and diarrhea.  She was seen in the ED where her hemoglobin was only 6 and CT findings suggestive of ileitis. Patient was started on flagyl for ileitis.   On 08/26/11 patient had witnessed seizure by nurse.  At that time BP 226/159, HR 136, R24. Per note RN reported" "mild" seizure like activity upon her arrival to the room. She described "mild" upper body "tremors" that were associated w/ a 1 to 2 min period of unresponsiveness and drooling. No bowel or bladder incontinence. Seizure activity resolved spontaneously and was followed by a brief postictal period." Patient states she can remember the event. She recalls both arms shaking  Right>left and inability to call out to people.   VPA level on 08/24/11 was 86.1  Recent labs BUN 49, Cr 2.07, AST/ALT wnl, WBC 15.0, TSH 4.570, UA positive for leukocytes/bacteria  Past Medical History  Diagnosis Date  . Pneumonia last 2 weeks    'walking pneumonia'  . Seizures   . Chronic kidney disease   . Anemia   . Heart attack   . Hypertension   . Renal disorder   . Cardiomyopathy   . Heart failure   . CHF (congestive heart failure)   . MPGN (membranoproliferative glomerulonephritis), type 2   . Ileitis     Past Surgical History  Procedure Date    . Renal biopsy     History reviewed. No pertinent family history.  Social History:  reports that she has never smoked. She does not have any smokeless tobacco history on file. She reports that she does not drink alcohol or use illicit drugs.  No Known Allergies  Medications:    Prior to Admission:  Prescriptions prior to admission  Medication Sig Dispense Refill  . amLODipine (NORVASC) 10 MG tablet Take 10 mg by mouth daily.      . clonazePAM (KLONOPIN) 0.5 MG tablet Take 0.5 mg by mouth 2 (two) times daily.       . cloNIDine (CATAPRES - DOSED IN MG/24 HR) 0.3 mg/24hr Place 1 patch onto the skin once a week. Apply on Tuesdays.      . divalproex (DEPAKOTE) 500 MG DR tablet Take 500 mg by mouth 3 (three) times daily.      . ferrous sulfate 325 (65 FE) MG tablet Take 325 mg by mouth 3 (three) times daily.      . furosemide (LASIX) 80 MG tablet Take 40 mg by mouth every evening.      . isosorbide mononitrate (IMDUR) 60 MG 24 hr tablet Take 60 mg by mouth daily.      Marland Kitchen lisinopril (PRINIVIL,ZESTRIL) 5 MG tablet Take 5 mg  by mouth every evening.      . metoprolol (LOPRESSOR) 50 MG tablet Take 50 mg by mouth 2 (two) times daily.       Scheduled:   . amLODipine  10 mg Oral Daily  . carvedilol  12.5 mg Oral BID WC  . clonazePAM  0.5 mg Oral BID  . cloNIDine  0.3 mg Transdermal Weekly  . darbepoetin (ARANESP) injection - NON-DIALYSIS  60 mcg Subcutaneous Q Wed-1800  . divalproex  500 mg Oral TID  . ferrous sulfate  325 mg Oral TID WC  . furosemide  40 mg Oral Daily  . isosorbide mononitrate  60 mg Oral Daily  . LORazepam      . LORazepam  1 mg Intravenous Once  . metroNIDAZOLE  500 mg Oral Q8H  . DISCONTD: ciprofloxacin  400 mg Intravenous QHS  . DISCONTD: lisinopril  5 mg Oral QHS  . DISCONTD: metoprolol  50 mg Oral BID  . DISCONTD: metronidazole  500 mg Intravenous Q8H    Review of Systems - General ROS: negative for - chills, fatigue, fever or hot flashes Hematological and  Lymphatic ROS: negative for - bruising, fatigue, jaundice or pallor Endocrine ROS: negative for - hair pattern changes, hot flashes, mood swings or skin changes Respiratory ROS: negative for - cough, hemoptysis, orthopnea or wheezing Cardiovascular ROS: negative for - dyspnea on exertion, orthopnea, palpitations or shortness of breath Gastrointestinal ROS: negative for - abdominal pain, appetite loss, blood in stools, diarrhea or hematemesis Musculoskeletal ROS: negative for - joint pain, joint stiffness, joint swelling or muscle pain Neurological ROS: positive for - seizures Dermatological ROS: negative for dry skin, pruritus and rash   Blood pressure 172/113, pulse 84, temperature 98.4 F (36.9 C), temperature source Oral, resp. rate 20, height 5\' 2"  (1.575 m), weight 53.3 kg (117 lb 8.1 oz), last menstrual period 05/24/2011, SpO2 97.00%.   Neurologic Examination:   Mental Status: Alert, oriented, thought content appropriate.  Speech fluent without evidence of aphasia. Able to follow 3 step commands without difficulty. Cranial Nerves: II-Visual fields grossly intact. III/IV/VI-Extraocular movements intact.  Pupils reactive bilaterally. V/VII-Smile symmetric VIII-grossly intact IX/X-normal gag XI-bilateral shoulder shrug XII-midline tongue extension Motor: 5/5 bilaterally with normal tone and bulk with exception of left DF ankle 3/5 and PF left ankle 4/5.  Right DF/PF 4/5. She also had a notable postural tremor when holding arms out in front of her.  Sensory: Pinprick and light touch intact throughout, bilaterally Deep Tendon Reflexes: 1+ bilateral KJ and AJ, 2+ bilateral UE.  Plantars downgoing bilaterally Cerebellar: Normal finger-to-nose,    Lab Results  Component Value Date/Time   CHOL 296* 03/26/2011 11:00 PM    Results for orders placed during the hospital encounter of 08/24/11 (from the past 48 hour(s))  ANA     Status: Normal   Collection Time   08/25/11 11:55 AM       Component Value Range Comment   ANA NEGATIVE  NEGATIVE    CBC     Status: Abnormal   Collection Time   08/26/11  5:55 AM      Component Value Range Comment   WBC 10.8 (*) 4.0 - 10.5 (K/uL)    RBC 2.21 (*) 3.87 - 5.11 (MIL/uL)    Hemoglobin 6.6 (*) 12.0 - 15.0 (g/dL)    HCT 16.1 (*) 09.6 - 46.0 (%)    MCV 87.3  78.0 - 100.0 (fL)    MCH 29.9  26.0 - 34.0 (pg)    MCHC  34.2  30.0 - 36.0 (g/dL)    RDW 16.1 (*) 09.6 - 15.5 (%)    Platelets 115 (*) 150 - 400 (K/uL) CONSISTENT WITH PREVIOUS RESULT  RENAL FUNCTION PANEL     Status: Abnormal   Collection Time   08/26/11  5:55 AM      Component Value Range Comment   Sodium 136  135 - 145 (mEq/L)    Potassium 4.1  3.5 - 5.1 (mEq/L)    Chloride 106  96 - 112 (mEq/L)    CO2 20  19 - 32 (mEq/L)    Glucose, Bld 77  70 - 99 (mg/dL)    BUN 51 (*) 6 - 23 (mg/dL)    Creatinine, Ser 0.45 (*) 0.50 - 1.10 (mg/dL)    Calcium 8.3 (*) 8.4 - 10.5 (mg/dL)    Phosphorus 5.2 (*) 2.3 - 4.6 (mg/dL)    Albumin 2.2 (*) 3.5 - 5.2 (g/dL)    GFR calc non Af Amer 32 (*) >90 (mL/min)    GFR calc Af Amer 37 (*) >90 (mL/min)   PROTIME-INR     Status: Normal   Collection Time   08/26/11  5:55 AM      Component Value Range Comment   Prothrombin Time 15.2  11.6 - 15.2 (seconds)    INR 1.18  0.00 - 1.49    PREPARE RBC (CROSSMATCH)     Status: Normal   Collection Time   08/26/11  7:14 AM      Component Value Range Comment   Order Confirmation ORDER PROCESSED BY BLOOD BANK     CLOSTRIDIUM DIFFICILE BY PCR     Status: Abnormal   Collection Time   08/26/11 11:01 AM      Component Value Range Comment   C difficile by pcr POSITIVE (*) NEGATIVE    CBC     Status: Abnormal   Collection Time   08/27/11  5:50 AM      Component Value Range Comment   WBC 15.0 (*) 4.0 - 10.5 (K/uL)    RBC 3.58 (*) 3.87 - 5.11 (MIL/uL)    Hemoglobin 10.8 (*) 12.0 - 15.0 (g/dL) POST TRANSFUSION SPECIMEN   HCT 31.4 (*) 36.0 - 46.0 (%)    MCV 87.7  78.0 - 100.0 (fL)    MCH 30.2  26.0 - 34.0 (pg)     MCHC 34.4  30.0 - 36.0 (g/dL)    RDW 40.9 (*) 81.1 - 15.5 (%)    Platelets 149 (*) 150 - 400 (K/uL)   RENAL FUNCTION PANEL     Status: Abnormal   Collection Time   08/27/11  5:50 AM      Component Value Range Comment   Sodium 137  135 - 145 (mEq/L)    Potassium 4.1  3.5 - 5.1 (mEq/L)    Chloride 106  96 - 112 (mEq/L)    CO2 18 (*) 19 - 32 (mEq/L)    Glucose, Bld 85  70 - 99 (mg/dL)    BUN 49 (*) 6 - 23 (mg/dL)    Creatinine, Ser 9.14 (*) 0.50 - 1.10 (mg/dL)    Calcium 8.7  8.4 - 10.5 (mg/dL)    Phosphorus 6.0 (*) 2.3 - 4.6 (mg/dL)    Albumin 2.2 (*) 3.5 - 5.2 (g/dL)    GFR calc non Af Amer 33 (*) >90 (mL/min)    GFR calc Af Amer 38 (*) >90 (mL/min)     No results found.   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  Patient has been seizure free on Depakote 500 TID for 6 months.  Levels upon arrival were therapeutic at 86.1.  She now presents with break through seizure in setting of C-Diff, Ileitis and anemia. Seizure event likely due to lowered seizure threshold secondary to metabolic abnormalities and infectious process.   Recommend: 1) Current VPA level is 67.4.  Would increase her Depakote to 750 mg TID .  First dose to be given IV. 2) Will check level in AM    Felicie Morn PA-C [facility] [phone]  08/27/2011, 9:48 AM    Patient seen and examined. I agree with the above.  Thana Farr, MD [facility] [phone]  08/27/2011  1:49 PM

## 2011-08-27 NOTE — Progress Notes (Signed)
S: Diarrhea a little better  Denies abd pain O:BP 172/113  Pulse 84  Temp(Src) 98.4 F (36.9 C) (Oral)  Resp 20  Ht 5\' 2"  (1.575 m)  Wt 53.3 kg (117 lb 8.1 oz)  BMI 21.49 kg/m2  SpO2 97%  LMP 05/24/2011  Intake/Output Summary (Last 24 hours) at 08/27/11 0859 Last data filed at 08/27/11 0524  Gross per 24 hour  Intake 1077.5 ml  Output      0 ml  Net 1077.5 ml   Weight change:  WUJ:WJXBJ and alert CVS:RRR Resp:clear Abd:+BS mild distention, mild diffuse tenderness Ext:no edema NEURO:CNI Ox3,  Says she was just joking with Dr Bosie Clos regarding her location      . amLODipine  10 mg Oral Daily  . clonazePAM  0.5 mg Oral BID  . cloNIDine  0.3 mg Transdermal Weekly  . darbepoetin (ARANESP) injection - NON-DIALYSIS  60 mcg Subcutaneous Q Wed-1800  . divalproex  500 mg Oral TID  . ferrous sulfate  325 mg Oral TID WC  . furosemide  40 mg Oral Daily  . isosorbide mononitrate  60 mg Oral Daily  . LORazepam      . LORazepam  1 mg Intravenous Once  . metoprolol  50 mg Oral BID  . metroNIDAZOLE  500 mg Oral Q8H  . DISCONTD: ciprofloxacin  400 mg Intravenous QHS  . DISCONTD: lisinopril  5 mg Oral QHS  . DISCONTD: metronidazole  500 mg Intravenous Q8H   No results found. BMET    Component Value Date/Time   NA 137 08/27/2011 0550   K 4.1 08/27/2011 0550   CL 106 08/27/2011 0550   CO2 18* 08/27/2011 0550   GLUCOSE 85 08/27/2011 0550   BUN 49* 08/27/2011 0550   CREATININE 2.07* 08/27/2011 0550   CALCIUM 8.7 08/27/2011 0550   GFRNONAA 33* 08/27/2011 0550   GFRAA 38* 08/27/2011 0550   CBC    Component Value Date/Time   WBC 15.0* 08/27/2011 0550   RBC 3.58* 08/27/2011 0550   HGB 10.8* 08/27/2011 0550   HCT 31.4* 08/27/2011 0550   PLT 149* 08/27/2011 0550   MCV 87.7 08/27/2011 0550   MCH 30.2 08/27/2011 0550   MCHC 34.4 08/27/2011 0550   RDW 17.8* 08/27/2011 0550   LYMPHSABS 1.9 08/24/2011 1601   MONOABS 1.4* 08/24/2011 1601   EOSABS 0.4 08/24/2011 1601   BASOSABS 0.0 08/24/2011 1601     Assessment: 1.  CKD3 sec C3 nephropathy, renal fx stable 2. C. Diff colitis 3. Anemia SP transfusion and aranesp started 4. HTN   Plan: 1. DC metoprolol and change to carvedilol 2. Follow HG 3.  Leave off Ace for now,  Plan to resume when stable or switch to ARB   Heather Sims T

## 2011-08-27 NOTE — Progress Notes (Signed)
Subjective: RN reports pt noted w/ "mild" seizure like activity upon her arrival to the room. She described "mild" upper body "tremors" that were associated w/ a 1 to 2 min period of unresponsiveness and drooling. No bowel or bladder incontinence. Seizure activity resolved spontaneously and was followed by a brief postictal period. Objective: Upon my arrival to bedside pt is awake and oriented. She is somewhat lethargic having just received IV Ativan. PERL, pt MAEx4, follows commands and currently denies any c/o's. BBS CTA.  BP160/98 P-96 W/ RRR, R-22 after IV Hydralazine. BP prior to IV Hydralazine 226/159. Most recent temp 98.3. CMP today w/o significant changes from admission. AM H&H 6.6/19.3 for which pt rec'd 2U PRBC's. Admission Valproic acid level 86.1. Assessment/Plan: 1. Likely seizure activity Pt w/ known seizure disorder s/p anoxic brain injury in Jan 2013. On Depakote 500 mg TID. Valproic acid level therapeutic. No focal neurological deficits. Will continue to closely monitor. 2. Hypertension Responded well to PRN Hydralazine. Is currently on daily Norvasc and Clonidine patch q24h. Will continue to monitor BP closely and give PRN Hydralazine as indicated. 3. Anemia No obvious signs bleeding. 2 U PRBC's today. Will follow CBC in am.

## 2011-08-27 NOTE — Plan of Care (Signed)
Problem: Phase III Progression Outcomes Goal: Activity at appropriate level-compared to baseline (UP IN CHAIR FOR HEMODIALYSIS)  Outcome: Completed/Met Date Met:  08/27/11 Patient OOB to Chair and ambulating in hallway

## 2011-08-27 NOTE — Progress Notes (Signed)
Patient ID: Veva Holes, female   DOB: 1990/05/11, 21 y.o.   MRN: [id number]  PATIENT DETAILS Name: Heather Sims Age: 21 y.o. Sex: female Date of Birth: 19-Jun-1990 Admit Date: 08/24/2011 WUJ:[id number], Odette Horns, MD, MD  Interim History: This is a 21 year old female with a complex past medical history that started in January 2013 with cardiac and respiratory arrest.  When she awoke she was found to have anoxic brain injury, seizures, severe kidney disease and CHF.  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  She presented to the ED with intermittent abdominal pain since Saturday (6/1). She was seen by her nephrologist on Friday and was diagnosed with hyperkalemia. She was given Kayexalate. By Saturday morning she started having abdominal pain and diarrhea. The abdominal pain is centrally located rated as 8/10 with no radiation, mild nausea but not vomiting. She reports black tarry stools (but was on iron). In the ED where her hemoglobin was found to be  6 and she had findings suggestive of ileitis on her CT scan. She denies fever or chills, no sick contacts, no history of inflammatory bowel disease.  She has received one unit of PRBCs overnight.  The following day she received two more units of PRBCs and was found to be C-Diff PCR positive.  During the night of 6/513 the patient had a mild seizure.  Consultations: 1.  Gastroenterology, [facility] 2.  [facility] 3.  Neurology  Procedures: 1.  Blood transfusion.  1 unit PRBCs the night of admission, and 2 more units 08/26/11  Subjective: Feels better, pain decreased.  Objective: Weight change:   Intake/Output Summary (Last 24 hours) at 08/27/11 1459 Last data filed at 08/27/11 0524  Gross per 24 hour  Intake    500 ml  Output      0 ml  Net    500 ml   Blood pressure 137/92, pulse 94, temperature 97.7 F (36.5 C), temperature source  Axillary, resp. rate 20, height 5\' 2"  (1.575 m), weight 53.3 kg (117 lb 8.1 oz), last menstrual period 05/24/2011, SpO2 98.00%. Filed Vitals:   08/26/11 2059 08/27/11 0645 08/27/11 1101 08/27/11 1331  BP: 159/98 172/113 176/124 137/92  Pulse: 96 84 88 94  Temp: 98.3 F (36.8 C) 98.4 F (36.9 C) 97.5 F (36.4 C) 97.7 F (36.5 C)  TempSrc: Oral Oral Oral Axillary  Resp: 20 20 20 20   Height:      Weight:      SpO2: 97% 97% 99% 98%    Physical Exam: General: appears youthful but chronically ill.  Smiling today, eating grits while sitting on the side of the bed. Lungs: Clear to auscultation bilaterally without wheezes or crackles Cardiovascular: Regular rate and rhythm without murmur gallop or rub normal S1 and S2 Abdomen: Feels as though it is distended with air.  soft, bowel sounds positive, tender near umbilicus Extremities: No significant cyanosis, clubbing, or edema bilateral lower extremities  C-Diff PCR: Positive  Basic Metabolic Panel:  Lab 08/27/11 1478 08/26/11 0555  NA 137 136  K 4.1 4.1  CL 106 106  CO2 18* 20  GLUCOSE 85 77  BUN 49* 51*  CREATININE 2.07* 2.12*  CALCIUM 8.7 8.3*  MG -- --  PHOS 6.0* 5.2*   Liver Function Tests:  Lab 08/27/11 0550 08/26/11 0555 08/25/11 0605 08/24/11 1740  AST -- -- 18 18  ALT -- -- 6 5  ALKPHOS -- -- 39 43  BILITOT -- -- 0.3 0.2*  PROT -- -- 6.3 6.0  ALBUMIN 2.2* 2.2* -- --    Lab 08/24/11 1740  LIPASE 15  AMYLASE --   CBC:  Lab 08/27/11 0550 08/26/11 0555 08/24/11 1601  WBC 15.0* 10.8* --  NEUTROABS -- -- 9.1*  HGB 10.8* 6.6* --  HCT 31.4* 19.3* --  MCV 87.7 87.3 --  PLT 149* 115* --   Anemia Panel:  Lab 08/25/11 0605  VITAMINB12 1076*  FOLATE 5.9  FERRITIN 360*  TIBC 215*  IRON 47  RETICCTPCT 4.4*   Urine Drug Screen: Drugs of Abuse     Component Value Date/Time   LABOPIA NEGATIVE 03/27/2011 0300   LABOPIA NONE DETECTED 03/26/2011 2041   COCAINSCRNUR NEGATIVE 03/27/2011 0300   COCAINSCRNUR NONE  DETECTED 03/26/2011 2041   LABBENZ NEGATIVE 03/27/2011 0300   LABBENZ NONE DETECTED 03/26/2011 2041   AMPHETMU NEGATIVE 03/27/2011 0300   AMPHETMU NONE DETECTED 03/26/2011 2041   THCU NONE DETECTED 03/26/2011 2041   LABBARB NONE DETECTED 03/26/2011 2041     Studies/Results:  CT Abd/Pelvis IMPRESSION:  1. Moderate to large volume of abdominal and pelvic ascites.  2. Markedly abnormal circumferential wall thickening of the distal ileum without definite involvement of the terminal ileum.  Findings could be due to inflammatory bowel disease or infectious ileitis. Small bowel lymphoma or small bowel hemorrhage would be considered less likely.  3. Small left pleural effusion.  4. Small pericardial effusion.  5. Suspect cardiomegaly, although heart is not completely included on the image.    Scheduled Meds:    . amLODipine  10 mg Oral Daily  . carvedilol  12.5 mg Oral BID WC  . clonazePAM  0.5 mg Oral BID  . cloNIDine  0.3 mg Transdermal Weekly  . darbepoetin (ARANESP) injection - NON-DIALYSIS  60 mcg Subcutaneous Q Wed-1800  . divalproex  250 mg Oral Once  . divalproex  750 mg Oral TID  . ferrous sulfate  325 mg Oral TID WC  . furosemide  40 mg Oral Daily  . isosorbide mononitrate  60 mg Oral Daily  . LORazepam      . LORazepam  1 mg Intravenous Once  . metroNIDAZOLE  500 mg Oral Q8H  . valproate sodium  750 mg Intravenous Once  . DISCONTD: ciprofloxacin  400 mg Intravenous QHS  . DISCONTD: divalproex  500 mg Oral TID  . DISCONTD: metoprolol  50 mg Oral BID  . DISCONTD: metronidazole  500 mg Intravenous Q8H   Continuous Infusions:   PRN Meds:.cloNIDine, hydrALAZINE, HYDROmorphone (DILAUDID) injection, ondansetron (ZOFRAN) IV, ondansetron, oxyCODONE  Anti-infectives:  Anti-infectives     Start     Dose/Rate Route Frequency Ordered Stop   08/27/11 0600   metroNIDAZOLE (FLAGYL) tablet 500 mg        500 mg Oral 3 times per day 08/27/11 0537     08/25/11 2359   ciprofloxacin (CIPRO) IVPB  400 mg  Status:  Discontinued        400 mg 200 mL/hr over 60 Minutes Intravenous Daily at bedtime 08/24/11 2250 08/26/11 1535   08/24/11 2359   metroNIDAZOLE (FLAGYL) IVPB 500 mg  Status:  Discontinued        500 mg 100 mL/hr over 60 Minutes Intravenous 3 times per day 08/24/11 2250 08/27/11 0537          Assessment/Plan: Principal Problem:  *Ileitis Active Problems:  Anemia  Hypertension  CKD (chronic kidney disease)  Hypoalbuminemia   1.  Ileitis  Abdominal pain and distention with circumferential thickening of the ileum on CT.   [facility] consulted. Appreciate their recommendations.    2.  C Difficile colitis On Flagyl TID.  Appears to be improving.  Will advance to soft solid diet.Slight leukocytosis today-but afebrile, repeat CBC tomorrow.  3.  Seizure (08/26/11).   Witnessed by staff.  (Mild tremor, patient non-responsive and drooling for approximately 2 minutes). BP 226/159 at the time. EEG ordered.  Appreciate Neurology consultation.  Depakote increased.  4.  Microcytic anemia.   Per Renal records her Hgb was 8 in April and 6.4 on 5/17.   Patient reports black tarry tools but has been on iron.  She received 3 units of PRBCsfor a hgb of 5.8. Would anticipate Hgb will level out at 8.8.  Likely her severe kidney disease is contributing to her anemia.  Dr. Briant Cedar is started on Epo which will be continued outpatient.  5.  MPGN. Renal is on board.  Appreciate their recommendations very much.  6.  Hypertension.   On Amlodipine, Clonidine,  Imdur, lasix.  Renal changed metoprolol to Carvedilol.  Hydralazine PRN added. Better Control.  Will montor.  Lisinopril d/c while acutely ill per Renal.  DVT Prophylaxis:  Will D/C lovenox and start SCDs. Disposition:  To home when medically ready, possibly as early as 08/28/2011.   LOS: 3 days   Stephani Police 08/27/2011, 2:59 PM [phone]  Attending -I have seen and examined the patient, I have made relevant changes to the  above documentation, I agree with assessment and plan.  Dr Windell [place]

## 2011-08-27 NOTE — Progress Notes (Signed)
Portable EEG completed

## 2011-08-27 NOTE — Procedures (Signed)
REFERRING PHYSICIAN:  Dr. Elyn Peers.  HISTORY:  [name] with breakthrough seizures.  MEDICATIONS:  Norvasc, Coreg, Klonopin, Catapres, Aranesp, Depakote, Lasix, Lopressor, Flagyl, Cipro.  CONDITIONS OF RECORDING:  This is a 16-channel EEG carried out with the patient in the awake and drowsy states.  DESCRIPTION:  The waking background activity consists of a low-voltage, symmetrical, fairly well-organized, 9 Hz alpha activity seen from the parieto-occipital and posterotemporal regions.  Low-voltage, fast activity, poorly organized was seen and during at times, superimposed on more posterior rhythms.  A mixture of theta and alpha rhythm was seen from the central and temporal regions.  The patient drowses with slowing to irregular which is theta and beta activity.  Stage 2 sleep was not obtained.  Hyperventilation was not performed.  Intermittent photic stimulation failed to elicit any change in the tracing.  IMPRESSION:  This is a normal EEG.          ______________________________ Thana Farr, MD    ZO:[id number] D:  08/27/2011 18:51:46  T:  08/27/2011 19:36:06  Job #:  [id number]

## 2011-08-27 NOTE — Progress Notes (Signed)
Patient ID: Veva Holes, female   DOB: November 27, 1990, 21 y.o.   MRN: [id number] [facility] Progress Note  ALLIA WILTSEY 21 y.o. [birth year]   Subjective: Awake; no complaints; not oriented to place: "[place]" but oriented to year: "2013" Mother reports diarrhea overnight. ?Seizure activity reported by NP last night.  Objective: Vital signs: Filed Vitals:   08/27/11 0645  BP: 172/113  Pulse: 84  Temp: 98.4 F (36.9 C)  Resp: 20    Intake/Output last 24 hrs:  Intake/Output Summary (Last 24 hours) at 08/27/11 0834 Last data filed at 08/27/11 0524  Gross per 24 hour  Intake 1132.17 ml  Output      0 ml  Net 1132.17 ml    Physical Exam: Gen: alert, no acute distress  Abd: diffusely tender with guarding, distended, decreased bowel sounds  Lab Results:  [facility] 08/27/11 0550 08/26/11 0555  NA 137 136  K 4.1 4.1  CL 106 106  CO2 18* 20  GLUCOSE 85 77  BUN 49* 51*  CREATININE 2.07* 2.12*  CALCIUM 8.7 8.3*  MG -- --  PHOS 6.0* 5.2*    Basename 08/27/11 0550 08/26/11 0555 08/25/11 0605 08/24/11 1740  AST -- -- 18 18  ALT -- -- 6 5  ALKPHOS -- -- 39 43  BILITOT -- -- 0.3 0.2*  PROT -- -- 6.3 6.0  ALBUMIN 2.2* 2.2* -- --    Basename 08/27/11 0550 08/26/11 0555 08/24/11 1601  WBC 15.0* 10.8* --  NEUTROABS -- -- 9.1*  HGB 10.8* 6.6* --  HCT 31.4* 19.3* --  MCV 87.7 87.3 --  PLT 149* 115* --     Medications: I have reviewed the patient's current medications.  Assessment/Plan: 21yo with C. Diff colitis and also CT evidence of ileitis of unclear etiology. Doubt C. Diff is source of ileitis. Abdominal pain has worsened and will do a stat abdominal Xray to look for air fluid levels. Continue Flagyl and supportive care. Defer neurologic issues to [facility] and [facility] aware of her confusion.   Rojean Ige C. 08/27/2011, 8:34 AM

## 2011-08-28 DIAGNOSIS — D649 Anemia, unspecified: Secondary | ICD-10-CM

## 2011-08-28 DIAGNOSIS — N189 Chronic kidney disease, unspecified: Secondary | ICD-10-CM

## 2011-08-28 DIAGNOSIS — R569 Unspecified convulsions: Secondary | ICD-10-CM

## 2011-08-28 DIAGNOSIS — I10 Essential (primary) hypertension: Secondary | ICD-10-CM

## 2011-08-28 DIAGNOSIS — K5000 Crohn's disease of small intestine without complications: Secondary | ICD-10-CM

## 2011-08-28 LAB — RENAL FUNCTION PANEL
Albumin: 2.2 g/dL — ABNORMAL LOW (ref 3.5–5.2)
CO2: 19 mEq/L (ref 19–32)
Calcium: 8.5 mg/dL (ref 8.4–10.5)
GFR calc Af Amer: 36 mL/min — ABNORMAL LOW (ref >90)
GFR calc non Af Amer: 31 mL/min — ABNORMAL LOW (ref >90)
Phosphorus: 5.4 mg/dL — ABNORMAL HIGH (ref 2.3–4.6)
Sodium: 140 mEq/L (ref 135–145)

## 2011-08-28 LAB — CBC
MCH: 31.2 pg (ref 26.0–34.0)
MCHC: 34.8 g/dL (ref 30.0–36.0)
Platelets: 138 10*3/uL — ABNORMAL LOW (ref 150–400)
RDW: 18.3 % — ABNORMAL HIGH (ref 11.5–15.5)

## 2011-08-28 LAB — OVA AND PARASITE EXAMINATION: Ova and parasites: NONE SEEN

## 2011-08-28 LAB — VALPROIC ACID LEVEL: Valproic Acid Lvl: 65.6 ug/mL (ref 50.0–100.0)

## 2011-08-28 MED ORDER — DIVALPROEX SODIUM 250 MG PO DR TAB: 750.0000 mg | DELAYED_RELEASE_TABLET | Freq: Three times a day (TID) | ORAL | Status: DC

## 2011-08-28 MED ORDER — CARVEDILOL 25 MG PO TABS
25.0000 mg | ORAL_TABLET | Freq: Two times a day (BID) | ORAL | Status: DC
  Filled 2011-08-28 (×2): qty 1

## 2011-08-28 MED ORDER — METRONIDAZOLE 500 MG PO TABS: 500.0000 mg | ORAL_TABLET | Freq: Three times a day (TID) | ORAL | Status: DC

## 2011-08-28 MED ORDER — DARBEPOETIN ALFA-POLYSORBATE 60 MCG/0.3ML IJ SOLN: 60.0000 ug | INTRAMUSCULAR | Status: DC

## 2011-08-28 MED ORDER — CARVEDILOL 25 MG PO TABS: 25.0000 mg | ORAL_TABLET | Freq: Two times a day (BID) | ORAL | Status: DC

## 2011-08-28 NOTE — Progress Notes (Signed)
Pt discharged to home per MD order.  IV removed, dressing and pressure applied, site unremarkable.  Discharge instructions, prescriptions, and follow-up appointments given.  All belongings sent with pt and all questions answered.  Pt transported in wheelchair by tech and traveled home with family by private vehicle.

## 2011-08-28 NOTE — Progress Notes (Signed)
Subjective: Patient without complaints.  No evidence of seizure activity overnight.  Seems to be tolerating increase in Depakote without noted side effects.  EEG was normal.  Objective: Current vital signs: BP 148/90  Pulse 95  Temp(Src) 97.9 F (36.6 C) (Oral)  Resp 20  Ht 5\' 2"  (1.575 m)  Wt 53.3 kg (117 lb 8.1 oz)  BMI 21.49 kg/m2  SpO2 97%  LMP 05/24/2011 Vital signs in last 24 hours: Temp:  [97.5 F (36.4 C)-98.2 F (36.8 C)] 97.9 F (36.6 C) (06/07 0549) Pulse Rate:  [83-97] 95  (06/07 0733) Resp:  [18-20] 20  (06/07 0549) BP: (125-176)/(82-124) 148/90 mmHg (06/07 0733) SpO2:  [97 %-100 %] 97 % (06/07 0549)  Intake/Output from previous day: 06/06 0701 - 06/07 0700 In: 1200 [P.O.:1200] Out: 202 [Urine:202] Intake/Output this shift:   Nutritional status: Dysphagia  Neurologic Exam: Mental Status: Alert, oriented, thought content appropriate.  Speech fluent without evidence of aphasia.  Able to follow 3 step commands without difficulty. Cranial Nerves: II: visual fields grossly normal, pupils equal, round, reactive to light and accommodation III,IV, VI: ptosis not present, extra-ocular motions intact bilaterally V,VII: smile symmetric, facial light touch sensation normal bilaterally VIII: hearing normal bilaterally IX,X: gag reflex present XI: trapezius strength/neck flexion strength normal bilaterally XII: tongue strength normal  Motor: Right : Upper extremity   5/5    Left:     Upper extremity   5/5 Tremor noted in the BUE's Sensory: Pinprick and light touch intact throughout, bilaterally Deep Tendon Reflexes: 2+ in the upper extremities and 1+ at the knees bilaterally.  AJ's absent bilaterally. Plantars: Right: downgoing   Left: downgoing   Lab Results: Results for orders placed during the hospital encounter of 08/24/11 (from the past 48 hour(s))  CLOSTRIDIUM DIFFICILE BY PCR     Status: Abnormal   Collection Time   08/26/11 11:01 AM      Component Value  Range Comment   C difficile by pcr POSITIVE (*) NEGATIVE    CBC     Status: Abnormal   Collection Time   08/27/11  5:50 AM      Component Value Range Comment   WBC 15.0 (*) 4.0 - 10.5 (K/uL)    RBC 3.58 (*) 3.87 - 5.11 (MIL/uL)    Hemoglobin 10.8 (*) 12.0 - 15.0 (g/dL) POST TRANSFUSION SPECIMEN   HCT 31.4 (*) 36.0 - 46.0 (%)    MCV 87.7  78.0 - 100.0 (fL)    MCH 30.2  26.0 - 34.0 (pg)    MCHC 34.4  30.0 - 36.0 (g/dL)    RDW 09.8 (*) 11.9 - 15.5 (%)    Platelets 149 (*) 150 - 400 (K/uL)   RENAL FUNCTION PANEL     Status: Abnormal   Collection Time   08/27/11  5:50 AM      Component Value Range Comment   Sodium 137  135 - 145 (mEq/L)    Potassium 4.1  3.5 - 5.1 (mEq/L)    Chloride 106  96 - 112 (mEq/L)    CO2 18 (*) 19 - 32 (mEq/L)    Glucose, Bld 85  70 - 99 (mg/dL)    BUN 49 (*) 6 - 23 (mg/dL)    Creatinine, Ser 1.47 (*) 0.50 - 1.10 (mg/dL)    Calcium 8.7  8.4 - 10.5 (mg/dL)    Phosphorus 6.0 (*) 2.3 - 4.6 (mg/dL)    Albumin 2.2 (*) 3.5 - 5.2 (g/dL)    GFR calc non Af  Amer 33 (*) >90 (mL/min)    GFR calc Af Amer 38 (*) >90 (mL/min)   VALPROIC ACID LEVEL     Status: Normal   Collection Time   08/27/11 10:20 AM      Component Value Range Comment   Valproic Acid Lvl 67.4  50.0 - 100.0 (ug/mL)   RENAL FUNCTION PANEL     Status: Abnormal   Collection Time   08/28/11  6:42 AM      Component Value Range Comment   Sodium 140  135 - 145 (mEq/L)    Potassium 4.0  3.5 - 5.1 (mEq/L)    Chloride 109  96 - 112 (mEq/L)    CO2 19  19 - 32 (mEq/L)    Glucose, Bld 89  70 - 99 (mg/dL)    BUN 46 (*) 6 - 23 (mg/dL)    Creatinine, Ser 1.61 (*) 0.50 - 1.10 (mg/dL)    Calcium 8.5  8.4 - 10.5 (mg/dL)    Phosphorus 5.4 (*) 2.3 - 4.6 (mg/dL)    Albumin 2.2 (*) 3.5 - 5.2 (g/dL)    GFR calc non Af Amer 31 (*) >90 (mL/min)    GFR calc Af Amer 36 (*) >90 (mL/min)   CBC     Status: Abnormal   Collection Time   08/28/11  6:42 AM      Component Value Range Comment   WBC 12.5 (*) 4.0 - 10.5 (K/uL)    RBC  2.85 (*) 3.87 - 5.11 (MIL/uL)    Hemoglobin 8.9 (*) 12.0 - 15.0 (g/dL)    HCT 09.6 (*) 04.5 - 46.0 (%)    MCV 89.8  78.0 - 100.0 (fL)    MCH 31.2  26.0 - 34.0 (pg)    MCHC 34.8  30.0 - 36.0 (g/dL)    RDW 40.9 (*) 81.1 - 15.5 (%)    Platelets 138 (*) 150 - 400 (K/uL)     Recent Results (from the past 240 hour(s))  URINE CULTURE     Status: Normal   Collection Time   08/24/11  7:02 PM      Component Value Range Status Comment   Specimen Description URINE, RANDOM   Final    Special Requests BJ:YNWGN ON [id number] @2119    Final    Culture  Setup Time [id number]   Final    Colony Count 50,000 COLONIES/ML   Final    Culture     Final    Value: Multiple bacterial morphotypes present, none predominant. Suggest appropriate recollection if clinically indicated.   Report Status 08/26/2011 FINAL   Final   CLOSTRIDIUM DIFFICILE BY PCR     Status: Abnormal   Collection Time   08/26/11 11:01 AM      Component Value Range Status Comment   C difficile by pcr POSITIVE (*) NEGATIVE  Final     Lipid Panel No results found for this basename: CHOL,TRIG,HDL,CHOLHDL,VLDL,LDLCALC in the last 72 hours  Studies/Results: Dg Abd Acute W/chest  08/27/2011  *RADIOLOGY REPORT*  Clinical Data: Abdominal pain  ACUTE ABDOMEN SERIES (ABDOMEN 2 VIEW & CHEST 1 VIEW)  Comparison: CT scan 08/24/2011  Findings: Cardiomegaly is noted. No acute infiltrate or pulmonary edema.  There is nonspecific nonobstructive bowel gas pattern.  No small bowel air-fluid levels are noted.  Contrast material from recent CT scan noted throughout the colon.  No free abdominal air.  IMPRESSION: No acute disease.  Cardiomegaly is noted.  Nonspecific nonobstructive bowel gas pattern.  No free abdominal air.  Contrast material from recent CT scan noted throughout the colon.  Original Report Authenticated By: Natasha Mead, M.D.    Medications:  I have reviewed the patient's current medications. Scheduled:   . amLODipine  10 mg Oral Daily  .  carvedilol  12.5 mg Oral BID WC  . clonazePAM  0.5 mg Oral BID  . cloNIDine  0.3 mg Transdermal Weekly  . darbepoetin (ARANESP) injection - NON-DIALYSIS  60 mcg Subcutaneous Q Wed-1800  . divalproex  250 mg Oral Once  . divalproex  750 mg Oral TID  . ferrous sulfate  325 mg Oral TID WC  . furosemide  40 mg Oral Daily  . isosorbide mononitrate  60 mg Oral Daily  . LORazepam  1 mg Intravenous Once  . metroNIDAZOLE  500 mg Oral Q8H  . valproate sodium  750 mg Intravenous Once  . DISCONTD: divalproex  500 mg Oral TID  . DISCONTD: metoprolol  50 mg Oral BID    Assessment/Plan:  Patient Active Hospital Problem List: Seizure disorder   Assessment:  No further breakthrough seizures noted.  Seizure threshhold likely decreased due to concurrent infection.  Today's level pending.  Trough level prior to increase in dose was 67.4.   Plan:  1.  Continue Depakote at current dosage.  Level pending.  Will follow up.    LOS: 4 days   Thana Farr, MD [facility] [phone] 08/28/2011  8:04 AM

## 2011-08-28 NOTE — Progress Notes (Signed)
S: No Abd pain.  Denies diarrhea O:BP 148/90  Pulse 95  Temp(Src) 97.9 F (36.6 C) (Oral)  Resp 20  Ht 5\' 2"  (1.575 m)  Wt 53.3 kg (117 lb 8.1 oz)  BMI 21.49 kg/m2  SpO2 97%  LMP 05/24/2011  Intake/Output Summary (Last 24 hours) at 08/28/11 0919 Last data filed at 08/28/11 0500  Gross per 24 hour  Intake    720 ml  Output    202 ml  Net    518 ml   Weight change:  VWU:[id number] and alert CVS:RRR Resp:clear Abd:+BS mild distention, no tender Ext:no edema NEURO:CNI Ox3,       . amLODipine  10 mg Oral Daily  . carvedilol  12.5 mg Oral BID WC  . clonazePAM  0.5 mg Oral BID  . cloNIDine  0.3 mg Transdermal Weekly  . darbepoetin (ARANESP) injection - NON-DIALYSIS  60 mcg Subcutaneous Q Wed-1800  . divalproex  250 mg Oral Once  . divalproex  750 mg Oral TID  . ferrous sulfate  325 mg Oral TID WC  . furosemide  40 mg Oral Daily  . isosorbide mononitrate  60 mg Oral Daily  . LORazepam  1 mg Intravenous Once  . metroNIDAZOLE  500 mg Oral Q8H  . valproate sodium  750 mg Intravenous Once  . DISCONTD: divalproex  500 mg Oral TID   Dg Abd Acute W/chest  08/27/2011  *RADIOLOGY REPORT*  Clinical Data: Abdominal pain  ACUTE ABDOMEN SERIES (ABDOMEN 2 VIEW & CHEST 1 VIEW)  Comparison: CT scan 08/24/2011  Findings: Cardiomegaly is noted. No acute infiltrate or pulmonary edema.  There is nonspecific nonobstructive bowel gas pattern.  No small bowel air-fluid levels are noted.  Contrast material from recent CT scan noted throughout the colon.  No free abdominal air.  IMPRESSION: No acute disease.  Cardiomegaly is noted.  Nonspecific nonobstructive bowel gas pattern.  No free abdominal air.  Contrast material from recent CT scan noted throughout the colon.  Original Report Authenticated By: Natasha Mead, M.D.   BMET    Component Value Date/Time   NA 140 08/28/2011 0642   K 4.0 08/28/2011 0642   CL 109 08/28/2011 0642   CO2 19 08/28/2011 0642   GLUCOSE 89 08/28/2011 0642   BUN 46* 08/28/2011 0642   CREATININE 2.18* 08/28/2011 0642   CALCIUM 8.5 08/28/2011 0642   GFRNONAA 31* 08/28/2011 0642   GFRAA 36* 08/28/2011 0642   CBC    Component Value Date/Time   WBC 12.5* 08/28/2011 0642   RBC 2.85* 08/28/2011 0642   HGB 8.9* 08/28/2011 0642   HCT 25.6* 08/28/2011 0642   PLT 138* 08/28/2011 0642   MCV 89.8 08/28/2011 0642   MCH 31.2 08/28/2011 0642   MCHC 34.8 08/28/2011 0642   RDW 18.3* 08/28/2011 0642   LYMPHSABS 1.9 08/24/2011 1601   MONOABS 1.4* 08/24/2011 1601   EOSABS 0.4 08/24/2011 1601   BASOSABS 0.0 08/24/2011 1601     Assessment: 1. CKD3 sec C3 nephropathy, renal fx stable 2. C. Diff colitis 3. Anemia SP transfusion and aranesp started 4. HTN   Plan: 1. Her pulse is in the 80's-90's and I think she should be able to tolerate 25mg  carvedilol BID 2.  Follow HG.   Myrella Fahs T

## 2011-08-28 NOTE — Discharge Summary (Signed)
Physician Discharge Summary  AVEENA BARI ZOX:[id number] DOB: 24-Jan-1991 DOA: 08/24/2011  PCP: Jaclyn Shaggy, MD, MD  Admit date: 08/24/2011 Discharge date: 08/28/2011  Recommendations for Outpatient Follow-up:  1. Follow-up with Dr. Caryn Section in 1-2 weeks 2. Pt to establish care with neurology  3. Follow-up with [facility] as needed  Discharge Diagnoses:  1. Cdiff colitis 2. Ileitis 3. Breakthrough seizures 4. Microcytic anemia s/p 3 units PRBCs this admit 5. 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 Hypertension   Discharge Condition: stable  Diet recommendation: low sodium, heart healthy  History of present illness:       This is a 21 year old female with a complex past medical history that started in January 2013 with cardiac and respiratory arrest. When she awoke she was found to have anoxic brain injury, seizures, severe kidney disease and CHF. 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        She presented to the ED with intermittent abdominal pain since Saturday (6/1). She was seen by her nephrologist on Friday prior to admit and was diagnosed with hyperkalemia. She was given Kayexalate. By Saturday morning she started having abdominal pain and diarrhea. She reports black tarry stools (but was on iron). In the ED where her hemoglobin was found to be 6 and she had findings suggestive of ileitis on her CT scan. She denies fever or chills, no sick contacts, no history of inflammatory bowel disease. She has received one unit of PRBCs overnight. The following day she received two more units of PRBCs and was found to be C-Diff PCR positive. During the night of 6/513 the patient had a mild seizure.     Hospital Course:  1. Ileitis  Abdominal pain and distention with circumferential thickening of the ileum on CT. [facility] consulted. Unclear etiology and felt unrelated to cdiff infection. Her abdominal pain has  now resolved and has continued to improve overall. She is to follow-up with GI as needed.  2. C Difficile colitis  On Flagyl TID. Continues to improve with resolution of diarrhea. Tolerating regular diet well. She has remained afebrile with improving leukocytosis. Continue Flagyl for 14 days total treatment.   3. Seizure (08/26/11).  Witnessed by staff. (Mild tremor, patient non-responsive and drooling for approximately 2 minutes). EEG ordered and unremarkable. Appreciate Neurology consultation. Depakote dose increased. She has been asked to establish care with neurologist.   4. Microcytic anemia.  Per Renal records her Hgb was 8 in April and 6.4 on 5/17. Patient reports black tarry tools but has been on iron. She received 3 units of PRBCsfor a hgb of 5.8. Would anticipate Hgb will level out at 8.8. Likely her severe kidney disease is contributing to her anemia. Dr. Briant Cedar is started on Epo which will be continued outpatient.   5. CKD3 secondary to nephropathy  Renal asked to follow throughout hospitalization. Pt's renal function has remained stable. In setting of acute illness, her ACEI has been discontinued. Can follow-up to determine when to resume vs switch to ARB.  6. Hypertension.  On Amlodipine, Clonidine, Imdur, lasix. Renal changed metoprolol to Carvedilol which pt is tolerated well.   Procedures:  S/p 3 units PRBCs during hospitalization  Consultations:  [facility]  [facility]  Neurology  Discharge Exam: Filed Vitals:   08/28/11 0946  BP: 150/100  Pulse:   Temp:   Resp:    Filed Vitals:   08/27/11 2153  08/28/11 0549 08/28/11 0733 08/28/11 0946  BP: 125/82 158/98 148/90 150/100  Pulse: 83 89 95   Temp: 98.2 F (36.8 C) 97.9 F (36.6 C)    TempSrc: Oral Oral    Resp: 18 20    Height:      Weight:      SpO2: 100% 97%     General: awake, alert in NAD Cardiovascular: S1S2 RRR, no m/r/g Respiratory: CTAB, no w/r/c, no increased work of breathing GI: abdomen is soft,  NT/ND, BS+ Neuro: no focal m/s deficits on exam Psych: AOx3, flat affect  Discharge Instructions  Discharge Orders    Future Appointments: Provider: Department: Dept Phone: Center:   09/01/2011 2:30 PM Su Monks, PA-C [facility] [phone] CPR     Future Orders Please Complete By Expires   Diet - low sodium heart healthy      Increase activity slowly      Discharge instructions      Comments:   Call your doctor or return to the ER for any worsening abdominal pain or diarrhea, or breakthrough seizures.     Medication List  As of 08/28/2011 10:13 AM   STOP taking these medications         lisinopril 5 MG tablet      metoprolol 50 MG tablet         TAKE these medications         amLODipine 10 MG tablet   Commonly known as: NORVASC   Take 10 mg by mouth daily.      carvedilol 25 MG tablet   Commonly known as: COREG   Take 1 tablet (25 mg total) by mouth 2 (two) times daily with a meal.      clonazePAM 0.5 MG tablet   Commonly known as: KLONOPIN   Take 0.5 mg by mouth 2 (two) times daily.      cloNIDine 0.3 mg/24hr   Commonly known as: CATAPRES - Dosed in mg/24 hr   Place 1 patch onto the skin once a week. Apply on Tuesdays.      darbepoetin 60 MCG/0.3ML Soln   Commonly known as: ARANESP   Inject 0.3 mLs (60 mcg total) into the skin every Wednesday at 6 PM. To be arranged by [name] at follow-up      divalproex 250 MG DR tablet   Commonly known as: DEPAKOTE   Take 3 tablets (750 mg total) by mouth 3 (three) times daily.      ferrous sulfate 325 (65 FE) MG tablet   Take 325 mg by mouth 3 (three) times daily.      furosemide 80 MG tablet   Commonly known as: LASIX   Take 40 mg by mouth every evening.      isosorbide mononitrate 60 MG 24 hr tablet   Commonly known as: IMDUR   Take 60 mg by mouth daily.      metroNIDAZOLE 500 MG tablet   Commonly known as: FLAGYL   Take 1 tablet (500 mg total) by mouth every 8 (eight) hours.           Follow-up  Information    Follow up with Zada Girt, MD. (schedule an appt for 1-2 weeks)    Contact information:   [address] [employer] [place] [address] [phone]       Follow up with Shirley Friar., MD. (follow-up as needed with [facility] )    Contact information:   [address], [address] [employer], [place]. [place] [address] [phone]       Follow up with Denton Meek, MD. (You need to establish with a neurologist to follow with. Please call to schedule an appointment.)    Contact information:   [address] [facility] [place] [address] [phone]           The results of significant diagnostics from this hospitalization (including imaging, microbiology, ancillary and laboratory) are listed below for reference.    Significant Diagnostic Studies: Ct Abdomen Pelvis Wo Contrast  08/24/2011  *RADIOLOGY REPORT*  Clinical Data: Abdominal distention and weakness.  History of renal disease.  CT ABDOMEN AND PELVIS WITHOUT CONTRAST  Technique:  Multidetector CT imaging of the abdomen and pelvis was performed following the standard protocol without intravenous contrast.  Comparison: Report from renal ultrasound dated 03/29/2011. It is noted that the patient's prior imaging studies are not currently merged with this CT.  The CT technologist has been notified and will leave a message for IT to merge this study with prior imaging studies.  Findings: At the lung bases, the heart size appears mildly enlarged, but is not completely included in the imaging field. A small pericardial effusion is seen anteriorly.  There is a small left pleural effusion. Left basilar atelectasis is present.  There is a moderate degree of abdominal ascites, and a large volume of pelvic ascites.  There are no prior studies of the abdomen pelvis for comparison.  The noncontrast appearance of the liver,  gallbladder, spleen, adrenal glands, pancreas, and kidneys is within normal limits. Both kidneys are normal in size and parenchymal thickness.  No urinary tract stones are identified.  No evidence of ureteral dilatation; the ureters are difficult to follow in their entirety due to the ascites.  The patient was given oral contrast, which has progressed to the level of the cecum at the time of the exam. In the right aspect of the pelvis, there is circumferentially thickened of the distal ileum, with a somewhat nodular and thickened wall.  Wall thickness measures up to 10 mm. No definite thickening of the terminal ileum. The appendix is seen on images 52-55 and is normal.  Proximal small bowel loops appear normal in wall thickness.  No evidence of bowel obstruction.  Moderate amount of stool in the sigmoid colon and rectum.  Urinary bladder, uterus, and ovaries within normal limits for noncontrast CT.  No definite pathologic lymphadenopathy, although evaluation is limited without intravenous contrast, and due to obscuration of the fat planes about the ascites.  No acute or suspicious bony abnormality.  Sacroiliac joints within normal limits.  The  IMPRESSION: 1.  Moderate to large volume of abdominal and pelvic ascites.   2.  Markedly abnormal circumferential wall thickening of the distal ileum without definite involvement of the terminal ileum. Findings could be due to inflammatory bowel disease or infectious ileitis.  Small bowel lymphoma or small bowel hemorrhage would be considered less likely. 3.  Small left pleural effusion. 4.  Small pericardial effusion. 5.  Suspect cardiomegaly, although heart is not completely included on the image.  Original Report Authenticated By: Britta Mccreedy, M.D.   Dg Abd Acute W/chest  08/27/2011  *RADIOLOGY REPORT*  Clinical Data: Abdominal pain  ACUTE ABDOMEN SERIES (ABDOMEN 2 VIEW & CHEST 1 VIEW)  Comparison: CT scan 08/24/2011  Findings: Cardiomegaly is noted. No acute infiltrate  or pulmonary edema.  There is nonspecific nonobstructive bowel gas pattern.  No small bowel air-fluid levels are noted.  Contrast material from recent CT scan noted throughout the colon.  No free abdominal air.  IMPRESSION: No acute disease.  Cardiomegaly is noted.  Nonspecific nonobstructive bowel gas pattern.  No free abdominal air.  Contrast material from recent CT scan noted throughout the colon.  Original Report Authenticated By: Natasha Mead, M.D.    Microbiology: Recent Results (from the past 240 hour(s))  URINE CULTURE     Status: Normal   Collection Time   08/24/11  7:02 PM      Component Value Range Status Comment   Specimen Description URINE, RANDOM   Final    Special Requests ZO:XWRUE ON [id number] @2119    Final    Culture  Setup Time [id number]   Final    Colony Count 50,000 COLONIES/ML   Final    Culture     Final    Value: Multiple bacterial morphotypes present, none predominant. Suggest appropriate recollection if clinically indicated.   Report Status 08/26/2011 FINAL   Final   CLOSTRIDIUM DIFFICILE BY PCR     Status: Abnormal   Collection Time   08/26/11 11:01 AM      Component Value Range Status Comment   C difficile by pcr POSITIVE (*) NEGATIVE  Final      Labs: Basic Metabolic Panel:  Lab 08/28/11 1308 08/27/11 0550 08/26/11 0555 08/25/11 0605 08/24/11 2349 08/24/11 1601  NA 140 137 136 137 -- 135  K 4.0 4.1 4.1 3.9 -- 3.9  CL 109 106 106 102 -- 102  CO2 19 18* 20 21 -- 21  GLUCOSE 89 85 77 82 -- 103*  BUN 46* 49* 51* 57* -- 59*  CREATININE 2.18* 2.07* 2.12* 2.21* 2.26* --  CALCIUM 8.5 8.7 8.3* 8.8 -- 8.7  MG -- -- -- -- -- --  PHOS 5.4* 6.0* 5.2* -- -- --   Liver Function Tests:  Lab 08/28/11 6578 08/27/11 0550 08/26/11 0555 08/25/11 0605 08/24/11 1740  AST -- -- -- 18 18  ALT -- -- -- 6 5  ALKPHOS -- -- -- 39 43  BILITOT -- -- -- 0.3 0.2*  PROT -- -- -- 6.3 6.0  ALBUMIN 2.2* 2.2* 2.2* 2.4* 2.3*    Lab 08/24/11 1740  LIPASE 15  AMYLASE --   No results  found for this basename: AMMONIA:5 in the last 168 hours CBC:  Lab 08/28/11 0642 08/27/11 0550 08/26/11 0555 08/25/11 0605 08/24/11 2349 08/24/11 1601  WBC 12.5* 15.0* 10.8* 14.3* 12.2* --  NEUTROABS -- -- -- -- -- 9.1*  HGB 8.9* 10.8* 6.6* 8.0* 5.8* --  HCT 25.6* 31.4* 19.3* 23.0* 16.7* --  MCV 89.8 87.7 87.3 86.5 88.4 --  PLT 138* 149* 115* 104* 79* --    Basename 03/26/11 2140  PROBNP 11557.0*   Time coordinating discharge: 35 minutes  Signed: Cordelia Pen, NP-C [facility]  pgr [phone]  Attending -I have seen and examined -she is better-tolerating a regular diet-pain free today, ok to be discharged home. Agree with assessment and plan as outlined above.  Jeoffrey Massed MD

## 2011-08-28 NOTE — Progress Notes (Signed)
Patient ID: Heather Sims, female   DOB: 09/08/1990, 21 y.o.   MRN: [id number] [facility] Progress Note  Heather Sims 21 y.o. May 10, 1990   Subjective: No diarrhea. Reports she was kidding yesterday when she said "[place]" for where she was and is oriented to place this morning. Abdominal pain better. Mother at bedside.  Objective: Vital signs: Filed Vitals:   08/28/11 0733  BP: 148/90  Pulse: 95  Temp:   Resp:     Physical Exam: Gen: alert, no acute distress  Abd: nontender, soft, nondistended  Lab Results:  [facility] 08/28/11 0642 08/27/11 0550  NA 140 137  K 4.0 4.1  CL 109 106  CO2 19 18*  GLUCOSE 89 85  BUN 46* 49*  CREATININE 2.18* 2.07*  CALCIUM 8.5 8.7  MG -- --  PHOS 5.4* 6.0*    Basename 08/28/11 0642 08/27/11 0550  AST -- --  ALT -- --  ALKPHOS -- --  BILITOT -- --  PROT -- --  ALBUMIN 2.2* 2.2*    Basename 08/28/11 0642 08/27/11 0550  WBC 12.5* 15.0*  NEUTROABS -- --  HGB 8.9* 10.8*  HCT 25.6* 31.4*  MCV 89.8 87.7  PLT 138* 149*      Assessment/Plan: 21yo with resolving C. Diff colitis and evidence of ileitis (less likely to be from C. Diff) but clinically from a GI standpoint she is better. Ok to go home from a GI standpoint on Flagyl to complete a 14 day course. F/U with [place] as needed.   Amahia Madonia C. 08/28/2011, 9:25 AM

## 2011-09-01 ENCOUNTER — Encounter: Payer: Self-pay | Admitting: Physical Medicine and Rehabilitation

## 2011-09-01 ENCOUNTER — Encounter: Payer: Medicaid Other | Attending: Physical Medicine & Rehabilitation | Admitting: Physical Medicine and Rehabilitation

## 2011-09-01 VITALS — BP 176/119 | HR 99 | Resp 16 | Ht 62.0 in | Wt 119.0 lb

## 2011-09-01 DIAGNOSIS — R292 Abnormal reflex: Secondary | ICD-10-CM | POA: Insufficient documentation

## 2011-09-01 DIAGNOSIS — I129 Hypertensive chronic kidney disease with stage 1 through stage 4 chronic kidney disease, or unspecified chronic kidney disease: Secondary | ICD-10-CM | POA: Insufficient documentation

## 2011-09-01 DIAGNOSIS — D649 Anemia, unspecified: Secondary | ICD-10-CM | POA: Insufficient documentation

## 2011-09-01 DIAGNOSIS — D696 Thrombocytopenia, unspecified: Secondary | ICD-10-CM | POA: Insufficient documentation

## 2011-09-01 DIAGNOSIS — F09 Unspecified mental disorder due to known physiological condition: Secondary | ICD-10-CM | POA: Insufficient documentation

## 2011-09-01 DIAGNOSIS — N189 Chronic kidney disease, unspecified: Secondary | ICD-10-CM | POA: Insufficient documentation

## 2011-09-01 DIAGNOSIS — G931 Anoxic brain damage, not elsewhere classified: Secondary | ICD-10-CM

## 2011-09-01 DIAGNOSIS — G253 Myoclonus: Secondary | ICD-10-CM | POA: Insufficient documentation

## 2011-09-01 MED ORDER — CLONAZEPAM 0.5 MG PO TABS: 0.5000 mg | ORAL_TABLET | Freq: Two times a day (BID) | ORAL | Status: DC

## 2011-09-01 NOTE — Progress Notes (Signed)
Subjective:    Patient ID: Heather Sims, female    DOB: 10/24/1990, 21 y.o.   MRN: [id number]  HPI  Heather Sims is back regarding her anoxic brain injury, in Jan 2013 She hasn't had much PT due to her MCD pending. HH only came out for 3 visits. Mom has been working with her at home. She is walking with a walker . She uses it with supervision at home. She uses a wheelchair outside the house.   Memory is improving as his her processing.  Sleep is good.   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 She reports that she was in the hospital last week because of stomach problems, which mostly have resolved, but she still complains about her abdomen being distended. She will follow up with her gastroenterologist soon. The patient's mother reports that she only got 3 physical therapy visits approved because the prescription for physical therapy did not say it was for rehabilitation after a brain injury.  Pain Inventory Average Pain 0 Pain Right Now 0 My pain is no pain  In the last 24 hours, has pain interfered with the following? General activity 0 Relation with others 0 Enjoyment of life 0 What TIME of day is your pain at its worst? morning Sleep (in general) Good  Pain is worse with: walking and some activites Pain improves with: no pain except when using walker sometimes Relief from Meds: 7  Mobility use a walker use a wheelchair  Function disabled: date disabled 2013 I need assistance with the following:  bathing and shopping  Neuro/Psych trouble walking spasms  Prior Studies Any changes since last visit?  no  Physicians involved in your care Any changes since last visit?  no   Family History  Problem Relation Age of Onset  . Hypertension Mother   . Diabetes Father    History   Social History  . Marital Status: Single    Spouse Name: N/A    Number of Children: N/A  . Years of Education: N/A   Social History Main  Topics  . Smoking status: Never Smoker   . Smokeless tobacco: Never Used  . Alcohol Use: No  . Drug Use: No  . Sexually Active: No   Other Topics Concern  . None   Social History Narrative   ** Merged History Encounter **    Past Surgical History  Procedure Date  . Renal biopsy    Past Medical History  Diagnosis Date  . Pneumonia last 2 weeks    'walking pneumonia'  . Seizures   . Chronic kidney disease   . Anemia   . Heart attack   . Hypertension   . Renal disorder   . Cardiomyopathy   . Heart failure   . CHF (congestive heart failure)   . MPGN (membranoproliferative glomerulonephritis), type 2   . Ileitis    BP 176/119  Pulse 99  Resp 16  Ht 5\' 2"  (1.575 m)  Wt 119 lb (53.978 kg)  BMI 21.77 kg/m2  SpO2 99%  LMP 05/24/2011   Review of Systems  Gastrointestinal: Positive for abdominal pain.  Musculoskeletal: Positive for gait problem.  All other systems reviewed and are negative.       Objective:   Physical Exam  Constitutional: She is oriented to person, place, and time. She appears well-developed and well-nourished.  HENT:  Head: Normocephalic.  Neck: Neck supple.  Musculoskeletal: Normal range of motion.  Neurological: She is  alert and oriented to person, place, and time.  Skin: Skin is warm and dry.  Psychiatric: She has a normal mood and affect.    Symmetric normal motor tone is noted throughout. Normal muscle bulk.But patient has tremors, when she is moving her extremities. Muscle testing reveals 5/5 muscle strength of the upper extremity, and 5/5 of the lower extremity, except tibialis anterior on the left 3+/5. Full range of motion in upper and lower extremities. Fine motor movements are slow in both hands.  No clonus is noted.  Patient is in a wheel chair.  Tremor noted.       Assessment & Plan:  1. 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 Myoclonus.  3. Seizure disorder- no further since  discharge  4. Renal failure- she follows up with nephrology on April 17  5. Hypertension. Remains borderline. No changes today  6. Anemia.  7. Thrombocytopenia - improved.  8. Mood -much improved  Plan:  1. Will make a referral for outpt PT for rehabilitation after brain injury.  She likely will need neuropsychological testing at some point as well.  2. refilled klonopin to 0.5mg  bid to help with myoclonus. 3. Continue with depakote for seizure prophylaxis  4. Maintain current bp med regimen.    Follow up in  Months with Dr. Riley Kill

## 2011-09-01 NOTE — Patient Instructions (Signed)
Continue with exercising, and walking.

## 2011-09-02 ENCOUNTER — Encounter: Payer: Medicaid Other | Admitting: Physical Medicine & Rehabilitation

## 2011-09-05 ENCOUNTER — Encounter (HOSPITAL_COMMUNITY): Payer: Self-pay | Admitting: *Deleted

## 2011-09-05 ENCOUNTER — Emergency Department (HOSPITAL_COMMUNITY)
Admission: EM | Admit: 2011-09-05 | Discharge: 2011-09-05 | Disposition: A | Discharge status: Home / Self Care | Payer: Medicaid Other | Attending: Emergency Medicine | Admitting: Emergency Medicine

## 2011-09-05 DIAGNOSIS — D649 Anemia, unspecified: Secondary | ICD-10-CM | POA: Insufficient documentation

## 2011-09-05 DIAGNOSIS — I509 Heart failure, unspecified: Secondary | ICD-10-CM | POA: Insufficient documentation

## 2011-09-05 DIAGNOSIS — N189 Chronic kidney disease, unspecified: Secondary | ICD-10-CM

## 2011-09-05 DIAGNOSIS — R197 Diarrhea, unspecified: Secondary | ICD-10-CM | POA: Insufficient documentation

## 2011-09-05 DIAGNOSIS — I129 Hypertensive chronic kidney disease with stage 1 through stage 4 chronic kidney disease, or unspecified chronic kidney disease: Secondary | ICD-10-CM | POA: Insufficient documentation

## 2011-09-05 DIAGNOSIS — G40909 Epilepsy, unspecified, not intractable, without status epilepticus: Secondary | ICD-10-CM | POA: Insufficient documentation

## 2011-09-05 DIAGNOSIS — R112 Nausea with vomiting, unspecified: Secondary | ICD-10-CM | POA: Insufficient documentation

## 2011-09-05 LAB — COMPREHENSIVE METABOLIC PANEL
ALT: 7 U/L (ref 0–35)
Alkaline Phosphatase: 37 U/L — ABNORMAL LOW (ref 39–117)
CO2: 18 mEq/L — ABNORMAL LOW (ref 19–32)
Calcium: 8.7 mg/dL (ref 8.4–10.5)
GFR calc Af Amer: 32 mL/min — ABNORMAL LOW (ref >90)
GFR calc non Af Amer: 27 mL/min — ABNORMAL LOW (ref >90)
Glucose, Bld: 104 mg/dL — ABNORMAL HIGH (ref 70–99)
Sodium: 135 mEq/L (ref 135–145)

## 2011-09-05 LAB — URINE MICROSCOPIC-ADD ON

## 2011-09-05 LAB — URINALYSIS, ROUTINE W REFLEX MICROSCOPIC
Ketones, ur: 15 mg/dL — AB
Protein, ur: 100 mg/dL — AB
Urobilinogen, UA: 0.2 mg/dL (ref 0.0–1.0)

## 2011-09-05 LAB — DIFFERENTIAL
Basophils Absolute: 0 10*3/uL (ref 0.0–0.1)
Basophils Relative: 0 % (ref 0–1)
Eosinophils Relative: 1 % (ref 0–5)
Lymphocytes Relative: 12 % (ref 12–46)
Neutro Abs: 14.5 10*3/uL — ABNORMAL HIGH (ref 1.7–7.7)

## 2011-09-05 LAB — CBC
MCHC: 33.9 g/dL (ref 30.0–36.0)
Platelets: 143 10*3/uL — ABNORMAL LOW (ref 150–400)
RDW: 18.2 % — ABNORMAL HIGH (ref 11.5–15.5)
WBC: 18.3 10*3/uL — ABNORMAL HIGH (ref 4.0–10.5)

## 2011-09-05 LAB — BASIC METABOLIC PANEL WITH GFR
Chloride: 105 meq/L (ref 96–112)
GFR calc Af Amer: 31 mL/min — ABNORMAL LOW (ref >90)
GFR calc non Af Amer: 26 mL/min — ABNORMAL LOW (ref >90)
Potassium: 5.7 meq/L — ABNORMAL HIGH (ref 3.5–5.1)
Sodium: 137 meq/L (ref 135–145)

## 2011-09-05 LAB — BASIC METABOLIC PANEL
BUN: 62 mg/dL — ABNORMAL HIGH (ref 6–23)
CO2: 20 mEq/L (ref 19–32)
Calcium: 8.6 mg/dL (ref 8.4–10.5)
Creatinine, Ser: 2.48 mg/dL — ABNORMAL HIGH (ref 0.50–1.10)
Glucose, Bld: 98 mg/dL (ref 70–99)

## 2011-09-05 MED ORDER — ONDANSETRON HCL 4 MG/2ML IJ SOLN
4.0000 mg | Freq: Once | INTRAMUSCULAR | Status: AC
  Administered 2011-09-05: 4 mg via INTRAVENOUS
  Filled 2011-09-05: qty 2

## 2011-09-05 MED ORDER — ONDANSETRON 8 MG PO TBDP: 8.0000 mg | ORAL_TABLET | Freq: Three times a day (TID) | ORAL | Status: AC | PRN

## 2011-09-05 MED ORDER — LABETALOL HCL 5 MG/ML IV SOLN
20.0000 mg | Freq: Once | INTRAVENOUS | Status: AC
  Administered 2011-09-05: 20 mg via INTRAVENOUS
  Filled 2011-09-05: qty 4

## 2011-09-05 MED ORDER — SODIUM CHLORIDE 0.9 % IV SOLN
Freq: Once | INTRAVENOUS | Status: AC
  Administered 2011-09-05: 12:00:00 via INTRAVENOUS

## 2011-09-05 MED ORDER — SODIUM CHLORIDE 0.9 % IV BOLUS (SEPSIS)
500.0000 mL | Freq: Once | INTRAVENOUS | Status: AC
  Administered 2011-09-05: 500 mL via INTRAVENOUS

## 2011-09-05 NOTE — ED Notes (Signed)
C/o n/v/d since last night. Was discharged week ago for colitis

## 2011-09-05 NOTE — ED Notes (Signed)
Per mother at bedside, pt having N/V/D starting last night, pt was admitted to our facility 1 week ago for colitis, pt unable to keep her medications, food, or fluid down. Pt denies any pain

## 2011-09-05 NOTE — ED Provider Notes (Signed)
History     CSN: [id number]  Arrival date & time 09/05/11  1053   First MD Initiated Contact with Patient 09/05/11 1101      Chief Complaint  Patient presents with  . Emesis  . Diarrhea    (Consider location/radiation/quality/duration/timing/severity/associated sxs/prior treatment) HPI Comments: Patient with recent history of c.diff colitis and ileitis comes in today with a chief complaint of nausea, vomiting, diarrhea, and mild diffuse abdominal pain.  Symptoms began last evening.  No blood in her emesis or blood in her stool.  She has not taken anything for her vomiting.  She was hospitalized for anemia, colitis, and ileitis on 08/25/11 and discharged on 08/28/11.  She was discharged home on Flagyl.  She is currently taking Flagyl.  She has a history of Chronic Kidney Disease, CHF, Anemia, and Seizure disorder.  She denies any fever or chills.  The history is provided by the patient and medical records.    Past Medical History  Diagnosis Date  . Pneumonia last 2 weeks    'walking pneumonia'  . Seizures   . Chronic kidney disease   . Anemia   . Heart attack   . Hypertension   . Renal disorder   . Cardiomyopathy   . Heart failure   . CHF (congestive heart failure)   . MPGN (membranoproliferative glomerulonephritis), type 2   . Ileitis     Past Surgical History  Procedure Date  . Renal biopsy     Family History  Problem Relation Age of Onset  . Hypertension Mother   . Diabetes Father     History  Substance Use Topics  . Smoking status: Never Smoker   . Smokeless tobacco: Never Used  . Alcohol Use: No    OB History    Grav Para Term Preterm Abortions TAB SAB Ect Mult Living                  Review of Systems  Constitutional: Negative for fever and chills.  Respiratory: Negative for shortness of breath.   Cardiovascular: Negative for chest pain.  Gastrointestinal: Positive for nausea, vomiting, abdominal pain, diarrhea and abdominal distention. Negative for  constipation and blood in stool.  Genitourinary: Negative for dysuria, urgency, frequency, hematuria, flank pain, decreased urine volume, vaginal discharge and pelvic pain.  Neurological: Negative for seizures, syncope and light-headedness.    Allergies  Review of patient's allergies indicates no known allergies.  Home Medications   Current Outpatient Rx  Name Route Sig Dispense Refill  . AMLODIPINE BESYLATE 10 MG PO TABS Oral Take 10 mg by mouth daily.    Marland Kitchen CARVEDILOL 25 MG PO TABS Oral Take 25 mg by mouth 2 (two) times daily with a meal.    . CLONAZEPAM 0.5 MG PO TABS Oral Take 0.5 mg by mouth 2 (two) times daily.    Marland Kitchen CLONIDINE HCL 0.3 MG/24HR TD PTWK Transdermal Place 1 patch onto the skin once a week. Apply on Tuesdays.    Marland Kitchen DARBEPOETIN ALFA-POLYSORBATE 60 MCG/0.3ML IJ SOLN Subcutaneous Inject 60 mcg into the skin every Wednesday at 6 PM.    . DIVALPROEX SODIUM 250 MG PO TBEC Oral Take 750 mg by mouth 3 (three) times daily.    Marland Kitchen FERROUS SULFATE 325 (65 FE) MG PO TABS Oral Take 325 mg by mouth 3 (three) times daily.    . FUROSEMIDE 80 MG PO TABS Oral Take 40 mg by mouth every evening.    . ISOSORBIDE MONONITRATE ER 60 MG PO  TB24 Oral Take 60 mg by mouth daily.    Marland Kitchen METOPROLOL TARTRATE 50 MG PO TABS Oral Take 50 mg by mouth Twice daily.     Marland Kitchen METRONIDAZOLE 500 MG PO TABS Oral Take 500 mg by mouth 3 (three) times daily.      BP 178/134  Pulse 93  Temp 97.9 F (36.6 C) (Oral)  Resp 14  SpO2 99%  LMP 05/24/2011  Physical Exam  Nursing note and vitals reviewed. Constitutional: She appears well-developed and well-nourished. No distress.  HENT:  Head: Normocephalic and atraumatic.  Mouth/Throat: Oropharynx is clear and moist.  Neck: Normal range of motion. Neck supple.  Cardiovascular: Normal rate, regular rhythm and normal heart sounds.   Pulmonary/Chest: Effort normal and breath sounds normal.  Abdominal: Soft. Bowel sounds are normal. She exhibits distension. She exhibits no  mass. There is no rebound and no guarding.       Mild diffuse abdominal pain  Musculoskeletal: Normal range of motion.  Neurological: She is alert.  Skin: Skin is warm and dry. She is not diaphoretic.  Psychiatric: She has a normal mood and affect.    ED Course  Procedures (including critical care time)  Labs Reviewed - No data to display No results found.   No diagnosis found.  Patient discussed with Dr. Oletta Lamas.    2:02 PM Reassessed patient.  No abdominal pain at this time.  On exam abdomen not tender to palpation.  Nausea and vomiting controlled.     Date: 09/05/2011  Rate: 91  Rhythm: normal sinus rhythm  QRS Axis: normal  Intervals: normal  ST/T Wave abnormalities: normal  Conduction Disutrbances:none  Narrative Interpretation:   Old EKG Reviewed: unchanged  3:44 PM Repeat Potassium shows a Potassium of 5.7.  Patient with elevated creatine and history of chronic kidney disease.  Discussed results with Dr. Oletta Lamas.  He recommends consulting the patient's PCP which is outpatient Family Practice.  4:15 PM Discussed with Family Practice Resident.  He reports that the patient is not a family practice patient.  Mother reports that the patient is a patient of [facility], but that she is followed closely by her Nephrologist Dr. Caryn Section.  She has an appointment scheduled with Dr. Caryn Section in 4 days.  Discussed with Dr. Jeraldine Loots.  He recommends having the patient follow up with Dr. Caryn Section as scheduled and does not feel that anything needs to be done about the hyperkalemia at this time. MDM  Patient presenting with nausea, vomiting, and diarrhea.  Patient has recent diagnosis of C diff. colitis and Ileitis.  Abdomen mildly tender on exam diffusely.  No rebound or guarding.  Patient afebrile.  Vomiting and diarrhea controlled while in ED. Patient able to tolerate po liquids prior to discharge.  No abdominal pain at discharge.  Patient also has a history of Chronic Kidney Disease.  Creatine mildly  elevated from baseline.  Patient given 500 cc bolus of IVF.  Patient also found to be hyperkalemic with a Potassium of 5.7.  No acute changes on EKG.  Patient has appointment scheduled with Nephrology in 4 days.          Pascal Lux Valencia, PA-C 09/06/11 2003  [address] [place], [place] 09/06/11 2004

## 2011-09-05 NOTE — ED Notes (Signed)
Gave old and new ECG to Dr. Oletta Lamas after I performed.2:38pm JG.

## 2011-09-05 NOTE — ED Notes (Signed)
Took pt a gingerale per RN Verlon Au. 5:22pm JG.

## 2011-09-05 NOTE — Discharge Instructions (Signed)
Follow up with your primary care doctor about your hospital visit. Also follow up with your Nephrologist on Wednesday as scheduled.  Continue to hydrate orally.Take all medications as prescribed & use Zofran as directed for nausea & vomiting.  Read the instructions below for reasons to return to the ER.   The 'BRAT' diet is suggested, then progress to diet as tolerated as symptoms abate. Call if bloody stools, persistent diarrhea, vomiting, fever or abdominal pain. Bananas.  Rice.  Applesauce.  Toast (and other simple starches such as crackers, potatoes, noodles).   SEEK IMMEDIATE MEDICAL ATTENTION IF:  You begin having localized abdominal pain that does not go away or becomes severe (The right side could  possibly be appendicitis. In an adult, the left lower portion of the abdomen could be colitis or diverticulitis)   A temperature above 101 develops  Repeated vomiting occurs (multiple uncontrollable episodes) or you are unable to keep fluids down  Blood is being passed in stools or vomit (bright red or black tarry stools).   Return also if you develop chest pain, difficulty breathing, dizziness or fainting, or become confused, poorly responsive, or inconsolable (young children).   RESOURCE GUIDE  Dental Problems  Patients with Medicaid: [facility] [phone]                                           [phone] [address] Phone:  [phone]                                                  Phone:  [phone]  If unable to pay or uninsured, contact:  [facility] or [facility] to become qualified for the adult dental clinic.  Chronic Pain Problems Contact [facility]  [phone] Patients need to be referred by their primary care doctor.  Insufficient Money for Medicine Contact [employer]:  call "211" or [facility] [phone].  No Primary Care Doctor Call [facility]   [phone] Other agencies that provide inexpensive medical care    [facility]  [phone]    [facility]  [phone]    [facility]  [phone]    [facility]  [phone]    [facility]  [phone]    [facility]  [phone]  Psychological Services [facility]  [phone] [facility] Services  [phone] [facility]   [phone] (emergency services [phone])  Substance Abuse Resources [facility]  [phone] [facility] [phone] [facility] [place] [phone] Floydene Flock [phone] Residential & Outpatient Substance Abuse Program  [phone]  Abuse/Neglect [facility] Child Abuse Hotline 5071630242 [facility] Child Abuse Hotline [phone] (After Hours)  Emergency Shelter [facility] [phone]  Maternity Homes [facility] [phone] [facility] [phone]  MRSA Hotline #:   539-426-5541    [facility] [place]  [facility] [address] [place]                       [address]      [address]  [place]                                                [place]                            [place] Phone:  [phone]                                   Phone:  [phone]                 Phone:  [phone]  [facility] Phone:  [phone]  [place] Child Abuse Hotline (336) 342-1394 [phone] (After Hours)    

## 2011-09-06 LAB — URINE CULTURE
Colony Count: NO GROWTH
Culture: NO GROWTH

## 2011-09-07 NOTE — ED Provider Notes (Signed)
Medical screening examination/treatment/procedure(s) were performed by non-physician practitioner and as supervising physician I was immediately available for consultation/collaboration.  Pt with stable vitals, abd pain and nausea improved with meds.  Discussed with PAC.  Given renal insuff and mild hyperkalemia, recommended IVF bolus and repeat BMP.  If still hyperkalemic, needs verification for close outpt follow up with PCP or nephology.  Pt's PCP reported to be [facility] and asked that they see pt.  No ECG changes concerning for cardiac irregularity from mild hyperkalemia.    Gavin Pound. Shalice Woodring, MD 09/07/11 1533

## 2011-09-11 ENCOUNTER — Inpatient Hospital Stay (HOSPITAL_COMMUNITY)
Admission: EM | Admit: 2011-09-11 | Discharge: 2011-09-24 | DRG: 372 | Disposition: A | Discharge status: Home / Self Care | Payer: Medicaid Other | Source: Ambulatory Visit | Attending: Internal Medicine | Admitting: Internal Medicine

## 2011-09-11 ENCOUNTER — Emergency Department (HOSPITAL_COMMUNITY): Payer: Medicaid Other

## 2011-09-11 ENCOUNTER — Encounter (HOSPITAL_COMMUNITY): Payer: Self-pay | Admitting: Emergency Medicine

## 2011-09-11 DIAGNOSIS — D649 Anemia, unspecified: Secondary | ICD-10-CM

## 2011-09-11 DIAGNOSIS — G253 Myoclonus: Secondary | ICD-10-CM

## 2011-09-11 DIAGNOSIS — N055 Unspecified nephritic syndrome with diffuse mesangiocapillary glomerulonephritis: Secondary | ICD-10-CM | POA: Diagnosis present

## 2011-09-11 DIAGNOSIS — I5022 Chronic systolic (congestive) heart failure: Secondary | ICD-10-CM

## 2011-09-11 DIAGNOSIS — E872 Acidosis, unspecified: Secondary | ICD-10-CM | POA: Diagnosis present

## 2011-09-11 DIAGNOSIS — E8809 Other disorders of plasma-protein metabolism, not elsewhere classified: Secondary | ICD-10-CM | POA: Diagnosis present

## 2011-09-11 DIAGNOSIS — R569 Unspecified convulsions: Secondary | ICD-10-CM

## 2011-09-11 DIAGNOSIS — D631 Anemia in chronic kidney disease: Secondary | ICD-10-CM | POA: Diagnosis present

## 2011-09-11 DIAGNOSIS — R112 Nausea with vomiting, unspecified: Secondary | ICD-10-CM

## 2011-09-11 DIAGNOSIS — N179 Acute kidney failure, unspecified: Secondary | ICD-10-CM | POA: Diagnosis present

## 2011-09-11 DIAGNOSIS — Z8669 Personal history of other diseases of the nervous system and sense organs: Secondary | ICD-10-CM | POA: Diagnosis present

## 2011-09-11 DIAGNOSIS — I252 Old myocardial infarction: Secondary | ICD-10-CM

## 2011-09-11 DIAGNOSIS — I129 Hypertensive chronic kidney disease with stage 1 through stage 4 chronic kidney disease, or unspecified chronic kidney disease: Secondary | ICD-10-CM | POA: Diagnosis present

## 2011-09-11 DIAGNOSIS — E860 Dehydration: Secondary | ICD-10-CM

## 2011-09-11 DIAGNOSIS — N184 Chronic kidney disease, stage 4 (severe): Secondary | ICD-10-CM | POA: Diagnosis present

## 2011-09-11 DIAGNOSIS — I428 Other cardiomyopathies: Secondary | ICD-10-CM | POA: Diagnosis present

## 2011-09-11 DIAGNOSIS — N189 Chronic kidney disease, unspecified: Secondary | ICD-10-CM

## 2011-09-11 DIAGNOSIS — N056 Unspecified nephritic syndrome with dense deposit disease: Secondary | ICD-10-CM

## 2011-09-11 DIAGNOSIS — G40802 Other epilepsy, not intractable, without status epilepticus: Secondary | ICD-10-CM | POA: Diagnosis present

## 2011-09-11 DIAGNOSIS — R1110 Vomiting, unspecified: Secondary | ICD-10-CM

## 2011-09-11 DIAGNOSIS — R0902 Hypoxemia: Secondary | ICD-10-CM

## 2011-09-11 DIAGNOSIS — D509 Iron deficiency anemia, unspecified: Secondary | ICD-10-CM | POA: Diagnosis present

## 2011-09-11 DIAGNOSIS — I10 Essential (primary) hypertension: Secondary | ICD-10-CM

## 2011-09-11 DIAGNOSIS — R188 Other ascites: Secondary | ICD-10-CM | POA: Diagnosis not present

## 2011-09-11 DIAGNOSIS — J189 Pneumonia, unspecified organism: Secondary | ICD-10-CM

## 2011-09-11 DIAGNOSIS — G931 Anoxic brain damage, not elsewhere classified: Secondary | ICD-10-CM | POA: Diagnosis present

## 2011-09-11 DIAGNOSIS — R197 Diarrhea, unspecified: Secondary | ICD-10-CM | POA: Diagnosis present

## 2011-09-11 DIAGNOSIS — I517 Cardiomegaly: Secondary | ICD-10-CM | POA: Diagnosis present

## 2011-09-11 DIAGNOSIS — A0472 Enterocolitis due to Clostridium difficile, not specified as recurrent: Principal | ICD-10-CM | POA: Diagnosis present

## 2011-09-11 DIAGNOSIS — I509 Heart failure, unspecified: Secondary | ICD-10-CM

## 2011-09-11 DIAGNOSIS — K529 Noninfective gastroenteritis and colitis, unspecified: Secondary | ICD-10-CM

## 2011-09-11 DIAGNOSIS — G40409 Other generalized epilepsy and epileptic syndromes, not intractable, without status epilepticus: Secondary | ICD-10-CM

## 2011-09-11 HISTORY — DX: Unspecified intracranial injury with loss of consciousness status unknown, initial encounter: S06.9XAA

## 2011-09-11 HISTORY — DX: Unspecified intracranial injury with loss of consciousness of unspecified duration, initial encounter: S06.9X9A

## 2011-09-11 LAB — DIFFERENTIAL
Basophils Relative: 0 % (ref 0–1)
Eosinophils Absolute: 0.4 10*3/uL (ref 0.0–0.7)
Eosinophils Relative: 3 % (ref 0–5)
Monocytes Relative: 10 % (ref 3–12)
Neutrophils Relative %: 73 % (ref 43–77)

## 2011-09-11 LAB — COMPREHENSIVE METABOLIC PANEL
Albumin: 2.1 g/dL — ABNORMAL LOW (ref 3.5–5.2)
Alkaline Phosphatase: 41 U/L (ref 39–117)
BUN: 83 mg/dL — ABNORMAL HIGH (ref 6–23)
Calcium: 8.4 mg/dL (ref 8.4–10.5)
GFR calc Af Amer: 23 mL/min — ABNORMAL LOW (ref >90)
Potassium: 5 mEq/L (ref 3.5–5.1)
Sodium: 137 mEq/L (ref 135–145)
Total Protein: 6.1 g/dL (ref 6.0–8.3)

## 2011-09-11 LAB — CBC
MCH: 31.7 pg (ref 26.0–34.0)
MCHC: 33.3 g/dL (ref 30.0–36.0)
MCV: 95 fL (ref 78.0–100.0)
Platelets: 172 10*3/uL (ref 150–400)

## 2011-09-11 LAB — LACTIC ACID, PLASMA: Lactic Acid, Venous: 0.7 mmol/L (ref 0.5–2.2)

## 2011-09-11 LAB — VALPROIC ACID LEVEL: Valproic Acid Lvl: 72.2 ug/mL (ref 50.0–100.0)

## 2011-09-11 MED ORDER — SODIUM CHLORIDE 0.9 % IV BOLUS (SEPSIS): 500.0000 mL | Freq: Once | INTRAVENOUS | Status: DC

## 2011-09-11 NOTE — ED Provider Notes (Signed)
History     CSN: [id number]  Arrival date & time 09/11/11  1628   First MD Initiated Contact with Patient 09/11/11 1900      Chief Complaint  Patient presents with  . Emesis  . Diarrhea    Patient is a 21 y.o. female presenting with vomiting. The history is provided by the patient and a relative.  Emesis  This is a recurrent problem. Episode onset: several days ago. The problem has been gradually worsening. The emesis has an appearance of stomach contents. There has been no fever. Associated symptoms include abdominal pain and diarrhea. Pertinent negatives include no fever.  worsened by -taking fluids Improved by - nothing  Pt presents for vomiting/diarrhea for past week.  There is also abdominal pain reported.   Mother reports patient had recent h/o cdif, has been on medications.  However she continues to have vomiting and diarrhea She also reports diffuse abdominal pain as well Pt has h/o chronic kidney disease but not on dialysis.   She also has h/o brain injury in the past as well.   Past Medical History  Diagnosis Date  . Pneumonia last 2 weeks    'walking pneumonia'  . Seizures   . Anemia   . Heart attack   . Cardiomyopathy   . Heart failure   . CHF (congestive heart failure)   . Ileitis   . Chronic kidney disease   . Renal disorder   . MPGN (membranoproliferative glomerulonephritis), type 2   . Hypertension   . Brain injury     Past Surgical History  Procedure Date  . Renal biopsy     Family History  Problem Relation Age of Onset  . Hypertension Mother   . Diabetes Father     History  Substance Use Topics  . Smoking status: Never Smoker   . Smokeless tobacco: Never Used  . Alcohol Use: No    OB History    Grav Para Term Preterm Abortions TAB SAB Ect Mult Living                  Review of Systems  Constitutional: Positive for fatigue. Negative for fever.  Gastrointestinal: Positive for vomiting, abdominal pain and diarrhea.  Neurological:  Negative for weakness.  All other systems reviewed and are negative.    Allergies  Review of patient's allergies indicates no known allergies.  Home Medications   Current Outpatient Rx  Name Route Sig Dispense Refill  . AMLODIPINE BESYLATE 10 MG PO TABS Oral Take 10 mg by mouth daily.    Marland Kitchen CARVEDILOL 25 MG PO TABS Oral Take 25 mg by mouth 2 (two) times daily with a meal.    . CLONAZEPAM 0.5 MG PO TABS Oral Take 0.5 mg by mouth 2 (two) times daily.    Marland Kitchen CLONIDINE HCL 0.3 MG/24HR TD PTWK Transdermal Place 1 patch onto the skin once a week. Apply on Tuesdays.    Marland Kitchen DARBEPOETIN ALFA-POLYSORBATE 60 MCG/0.3ML IJ SOLN Subcutaneous Inject 60 mcg into the skin every Wednesday at 6 PM.    . DIVALPROEX SODIUM 250 MG PO TBEC Oral Take 750 mg by mouth 3 (three) times daily.    Marland Kitchen FERROUS SULFATE 325 (65 FE) MG PO TABS Oral Take 325 mg by mouth 3 (three) times daily.    . FUROSEMIDE 80 MG PO TABS Oral Take 40 mg by mouth every evening.    . ISOSORBIDE MONONITRATE ER 60 MG PO TB24 Oral Take 60 mg by mouth daily.    Marland Kitchen  METOPROLOL TARTRATE 50 MG PO TABS Oral Take 50 mg by mouth Twice daily.     Marland Kitchen ONDANSETRON 8 MG PO TBDP Oral Take 1 tablet (8 mg total) by mouth every 8 (eight) hours as needed for nausea. 20 tablet 0    BP 120/88  Pulse 98  Temp 98 F (36.7 C) (Oral)  Resp 16  Ht 5\' 2"  (1.575 m)  Wt 119 lb (53.978 kg)  BMI 21.77 kg/m2  SpO2 98%  LMP 05/24/2011  Physical Exam CONSTITUTIONAL: chronically ill appearing patient but no distress HEAD AND FACE: Normocephalic/atraumatic EYES: EOMI/PERRL ENMT: Mucous membranes dry NECK: supple no meningeal signs SPINE:entire spine nontender CV: S1/S2 noted LUNGS: Lungs are clear to auscultation bilaterally, no apparent distress ABDOMEN: soft, distended but only mild tenderness noted throughout.  No rebound or guarding is noted GU:no cva tenderness NEURO: Pt is awake/alert, moves all extremitiesx4.   EXTREMITIES: pulses normal, full ROM SKIN: warm,  color normal PSYCH: no abnormalities of mood noted   ED Course  Procedures   Labs Reviewed  CBC - Abnormal; Notable for the following:    WBC 13.7 (*)     RBC 3.79 (*)     RDW 17.7 (*)     All other components within normal limits  DIFFERENTIAL - Abnormal; Notable for the following:    Neutro Abs 10.0 (*)     Monocytes Absolute 1.3 (*)     All other components within normal limits  COMPREHENSIVE METABOLIC PANEL - Abnormal; Notable for the following:    CO2 18 (*)     BUN 83 (*)     Creatinine, Ser 3.19 (*)     Albumin 2.1 (*)     Total Bilirubin 0.2 (*)     GFR calc non Af Amer 20 (*)     GFR calc Af Amer 23 (*)     All other components within normal limits  LACTIC ACID, PLASMA  VALPROIC ACID LEVEL   8:08 PM Pt with h/o recurrent vomiting/diarrhea, dehydrated by labs, not tolerating much PO at home Will check lactate and acute abd series.  If these are negative will not proceed with Ct imaging of abdomen as had CT earlier this month.  Given failure of outpatient therapy will need admitted.      MDM  Nursing notes including past medical history and social history reviewed and considered in documentation All labs/vitals reviewed and considered Previous records reviewed and considered - recent admission for anemia and cdfif that was treated with flagyll         Joya Gaskins, MD 09/11/11 2018

## 2011-09-11 NOTE — ED Notes (Signed)
Complaining of abdominal pain with nausea, vomiting, and diarrhea for 7 days. Seen here for similar symptoms last Saturday and referred to GI. Has not eaten or drank. Abdomin distended.

## 2011-09-11 NOTE — ED Notes (Signed)
Pt. Mother reports that pt has had nvd since last Friday. Was seen here on Saturday and sent home with prescription for Zofran which she is getting no relief from. NVD continues. Pt. Abdomen is distended. Bowel sounds present.

## 2011-09-11 NOTE — ED Provider Notes (Signed)
Patient was sent to the clinical decision unit by Dr. Bebe Shaggy for further evaluation and management of abdominal pain with nausea and vomiting. Patient has chronic kidney disease but has had worsening of her renal function since a few day history of persistent nausea and vomiting. She was recently admitted for similar symptoms with a negative CT scan. At this time her acute abdominal series did not show any acute findings. Her labs do show worsening kidney function. [facility] be [facility] for FOR rehydration and recheck of her kidney function.  [place], [place] 09/11/11 2227

## 2011-09-11 NOTE — H&P (Signed)
PCP:   Jaclyn Shaggy, MD  Nephrologist: Dr. Caryn Section Cardiology: Dr. Eden Emms  Chief Complaint:   Diarrhea, abdominal pain, nausea and voming  HPI: Heather Sims is a 21 y.o. female   has a past medical history of Pneumonia (last 2 weeks); Seizures; Anemia; Heart attack; Cardiomyopathy; Heart failure; CHF (congestive heart failure); Ileitis; Chronic kidney disease; Renal disorder; MPGN (membranoproliferative glomerulonephritis), type 2; Hypertension; and Brain injury.   Presented with  1 wk history of diarrhea now improving , but continues to have nausea and vomiting. She has history of C.diff colitis and she finished her course of Flagyl. Complains of abdominal pain. She has hard time keeping fluids down but abble if given zofran. Denies fever but stays chilled. No shortness of breath and no chest pain.  History obtained through mother as patient unable to provide. She has complex medical history. Apparently was in the normal state of health up until January this year when she had acute respiratory arrest due to pulmonary edema in the setting of acute kidney failure and CHF due to NICM with EF of 35-40%. This resulted in Anoxic brain injury. Renal biopsy showed nephritis.   Review of Systems:    Pertinent positives include: abdominal pain, nausea, vomiting, diarrhea,   Constitutional:  No weight loss, night sweats, Fevers, chills, fatigue, weight loss  HEENT:  No headaches, Difficulty swallowing,Tooth/dental problems,Sore throat,  No sneezing, itching, ear ache, nasal congestion, post nasal drip,  Cardio-vascular:  No chest pain, Orthopnea, PND, anasarca, dizziness, palpitations.no Bilateral lower extremity swelling  GI:  No heartburn, indigestion,change in bowel habits, loss of appetite, melena, blood in stool, hematemesis Resp:  no shortness of breath at rest. No dyspnea on exertion, No excess mucus, no productive cough, No non-productive cough, No coughing up of blood.No change in  color of mucus.No wheezing. Skin:  no rash or lesions. No jaundice GU:  no dysuria, change in color of urine, no urgency or frequency. No straining to urinate.  No flank pain.  Musculoskeletal:  No joint pain or no joint swelling. No decreased range of motion. No back pain.  Psych:  No change in mood or affect. No depression or anxiety. No memory loss.  Neuro: no localizing neurological complaints, no tingling, no weakness, no double vision, no gait abnormality, no slurred speech, no confusion  Otherwise ROS are negative except for above, 10 systems were reviewed  Past Medical History: Past Medical History  Diagnosis Date  . Pneumonia last 2 weeks    'walking pneumonia'  . Seizures   . Anemia   . Heart attack   . Cardiomyopathy   . Heart failure   . CHF (congestive heart failure)   . Ileitis   . Chronic kidney disease   . Renal disorder   . MPGN (membranoproliferative glomerulonephritis), type 2   . Hypertension   . Brain injury    Past Surgical History  Procedure Date  . Renal biopsy      Medications: Prior to Admission medications   Medication Sig Start Date End Date Taking? Authorizing Provider  amLODipine (NORVASC) 10 MG tablet Take 10 mg by mouth daily.   Yes Historical Provider, MD  carvedilol (COREG) 25 MG tablet Take 25 mg by mouth 2 (two) times daily with a meal.   Yes Historical Provider, MD  clonazePAM (KLONOPIN) 0.5 MG tablet Take 0.5 mg by mouth 2 (two) times daily.   Yes Historical Provider, MD  cloNIDine (CATAPRES - DOSED IN MG/24 HR) 0.3 mg/24hr Place 1 patch  onto the skin once a week. Apply on Tuesdays.   Yes Historical Provider, MD  darbepoetin (ARANESP) 60 MCG/0.3ML SOLN Inject 60 mcg into the skin every Wednesday at 6 PM.   Yes Historical Provider, MD  divalproex (DEPAKOTE) 250 MG DR tablet Take 750 mg by mouth 3 (three) times daily.   Yes Historical Provider, MD  ferrous sulfate 325 (65 FE) MG tablet Take 325 mg by mouth 3 (three) times daily.   Yes  Historical Provider, MD  furosemide (LASIX) 80 MG tablet Take 80 mg by mouth daily.    Yes Historical Provider, MD  isosorbide mononitrate (IMDUR) 60 MG 24 hr tablet Take 60 mg by mouth daily. 07/03/11  Yes Ranelle Oyster, MD  ondansetron (ZOFRAN ODT) 8 MG disintegrating tablet Take 1 tablet (8 mg total) by mouth every 8 (eight) hours as needed for nausea. 09/05/11 09/12/11 Yes Heather Zenaida Niece Wingen, PA-C    Allergies:  No Known Allergies  Social History:  Ambulatory with wheelchair Lives at  Home with family   reports that she has never smoked. She has never used smokeless tobacco. She reports that she does not drink alcohol or use illicit drugs.   Family History: family history includes Diabetes in her father and Hypertension in her mother.    Physical Exam: Patient Vitals for the past 24 hrs:  BP Temp Temp src Pulse Resp SpO2 Height Weight  09/11/11 1912 120/88 mmHg 98 F (36.7 C) - 98  - 98 % - -  09/11/11 1646 - - - - - - 5\' 2"  (1.575 m) 53.978 kg (119 lb)  09/11/11 1634 122/91 mmHg 98.9 F (37.2 C) Oral 83  16  96 % - -    1. General:  in No Acute distress 2. Psychological: Alert but Oriented 3. Head/ENT:    Dry Mucous Membranes                          Head Non traumatic, neck supple                          Normal  Dentition 4. SKIN: normal  Skin turgor,  Skin clean Dry and intact no rash 5. Heart: Regular rate and rhythm no Murmur, Rub or gallop 6. Lungs: Clear to auscultation bilaterally, no wheezes or crackles , poor effort 7. Abdomen: Soft, non-tender,  distended 8. Lower extremities: no clubbing, cyanosis, or edema 9. Neurologically Grossly intact, moving all 4 extremities equally 10. MSK: Normal range of motion  body mass index is 21.77 kg/(m^2).   Labs on Admission:   [facility] 09/11/11 1802  NA 137  K 5.0  CL 103  CO2 18*  GLUCOSE 89  BUN 83*  CREATININE 3.19*  CALCIUM 8.4  MG --  PHOS --    Basename 09/11/11 1802  AST 23  ALT 5  ALKPHOS 41    BILITOT 0.2*  PROT 6.1  ALBUMIN 2.1*   No results found for this basename: LIPASE:2,AMYLASE:2 in the last 72 hours  Basename 09/11/11 1802  WBC 13.7*  NEUTROABS 10.0*  HGB 12.0  HCT 36.0  MCV 95.0  PLT 172   No results found for this basename: CKTOTAL:3,CKMB:3,CKMBINDEX:3,TROPONINI:3 in the last 72 hours No results found for this basename: TSH,T4TOTAL,FREET3,T3FREE,THYROIDAB in the last 72 hours No results found for this basename: VITAMINB12:2,FOLATE:2,FERRITIN:2,TIBC:2,IRON:2,RETICCTPCT:2 in the last 72 hours No results found for this basename: HGBA1C    Estimated Creatinine  Clearance: 21.9 ml/min (by C-G formula based on Cr of 3.19). ABG    Component Value Date/Time   PHART 7.392 04/04/2011 0457   HCO3 22.2 04/04/2011 0457   TCO2 23 04/04/2011 0457   ACIDBASEDEF 2.0 04/04/2011 0457   O2SAT 96.0 04/04/2011 0457     No results found for this basename: DDIMER     Cultures:    Component Value Date/Time   SDES URINE, CATHETERIZED 09/05/2011 1454   SPECREQUEST ADDED ON 09/05/11 AT 1639 09/05/2011 1454   CULT NO GROWTH 09/05/2011 1454   REPTSTATUS 09/06/2011 FINAL 09/05/2011 1454       Radiological Exams on Admission: Dg Abd Acute W/chest  09/11/2011  *RADIOLOGY REPORT*  Clinical Data: 22 year old female with renal failure, left abdominal pain and distention.  Vomiting.  ACUTE ABDOMEN SERIES (ABDOMEN 2 VIEW & CHEST 1 VIEW)  Comparison: 08/27/2011 radiographs and 08/24/2011 ET  Findings: Cardiomegaly and pulmonary vascular congestion identified. A small left pleural effusion and left basilar atelectasis again noted. There is no evidence of pneumothorax.  There is no evidence of bowel obstruction or pneumoperitoneum. Increased hazy opacity overlying the abdomen suggests ascites. No suspicious calcifications are present. There is no evidence of acute bony abnormality.  IMPRESSION: Unremarkable bowel gas pattern - no evidence of bowel obstruction or pneumoperitoneum.  Probable ascites.   Cardiomegaly, pulmonary vascular congestion, small left pleural effusion and left basilar atelectasis.  Original Report Authenticated By: Rosendo Gros, M.D.    Assessment/Plan  21 yo F with complex medical history here with persistent diarrhea in the setting of recent c.diff and dehydration. Rsulting in acute on chronic renal failure  Present on Admission:  .Acute renal failure is most likely secondary to dehydration. Given history of CHF will get but can't tell IV fluids avoid for hydration. Patient has low albumin, she will likely retain fluids.  .Dehydration - will hold Lasix .Anoxic brain damage - patient will need to resume PT OT .CKD (chronic kidney disease) - strength in around 2.1 we'll continue to monitor consider nephrology consult .C. difficile colitis - patient finished recent Flagyl course and her diarrhea has improved although not completely resolved. Repeat C. Difficile PCR likely will come back positive the fact that she is clinically improving from a diarrhea standpoint is very assuring. But she continues to have nausea and vomiting and poor by mouth intake. Will obtain stool cultures patient had abdominal imaging in the past, consider GI consult for further  Workup of persistent nausea and vomiting .Seizure disorder, grand mal - continue Depakote therapeutic .Hypoalbuminemia - nutritional consult   Prophylaxis:Lovenox, Protonix  CODE STATUS: FULL CODE  Other plan as per orders.  I have spent a total of on this admission  Dream Nodal 09/11/2011, 10:34 PM

## 2011-09-11 NOTE — ED Notes (Signed)
Came in last Thursday and pt c/o same symptoms, pt c/o "stomach ache", diarrhea- was tx for c-diff when here last thursday, decreased appetite, n/v

## 2011-09-11 NOTE — ED Provider Notes (Signed)
Medical screening examination/treatment/procedure(s) were conducted as a shared visit with non-physician practitioner(s) and myself.  I personally evaluated the patient during the encounter   Joya Gaskins, MD 09/11/11 2322

## 2011-09-12 ENCOUNTER — Encounter (HOSPITAL_COMMUNITY): Payer: Self-pay | Admitting: *Deleted

## 2011-09-12 DIAGNOSIS — R112 Nausea with vomiting, unspecified: Secondary | ICD-10-CM

## 2011-09-12 DIAGNOSIS — I5022 Chronic systolic (congestive) heart failure: Secondary | ICD-10-CM

## 2011-09-12 DIAGNOSIS — N179 Acute kidney failure, unspecified: Secondary | ICD-10-CM

## 2011-09-12 DIAGNOSIS — E860 Dehydration: Secondary | ICD-10-CM

## 2011-09-12 LAB — COMPREHENSIVE METABOLIC PANEL
ALT: <3 U/L (ref 0–35)
AST: 18 U/L (ref 0–37)
Alkaline Phosphatase: 39 U/L (ref 39–117)
CO2: 17 mEq/L — ABNORMAL LOW (ref 19–32)
Calcium: 8.1 mg/dL — ABNORMAL LOW (ref 8.4–10.5)
Chloride: 105 mEq/L (ref 96–112)
GFR calc Af Amer: 26 mL/min — ABNORMAL LOW (ref >90)
GFR calc non Af Amer: 22 mL/min — ABNORMAL LOW (ref >90)
Glucose, Bld: 86 mg/dL (ref 70–99)
Potassium: 4.5 mEq/L (ref 3.5–5.1)
Sodium: 137 mEq/L (ref 135–145)
Total Bilirubin: 0.1 mg/dL — ABNORMAL LOW (ref 0.3–1.2)

## 2011-09-12 LAB — URINALYSIS, ROUTINE W REFLEX MICROSCOPIC
Bilirubin Urine: NEGATIVE
Ketones, ur: NEGATIVE mg/dL
Nitrite: NEGATIVE
pH: 5 (ref 5.0–8.0)

## 2011-09-12 LAB — CLOSTRIDIUM DIFFICILE BY PCR: Toxigenic C. Difficile by PCR: POSITIVE — AB

## 2011-09-12 LAB — CBC
MCH: 31.9 pg (ref 26.0–34.0)
MCHC: 33.6 g/dL (ref 30.0–36.0)
Platelets: 186 10*3/uL (ref 150–400)
RDW: 17.8 % — ABNORMAL HIGH (ref 11.5–15.5)

## 2011-09-12 LAB — URINE MICROSCOPIC-ADD ON

## 2011-09-12 LAB — TSH: TSH: 7.226 u[IU]/mL — ABNORMAL HIGH (ref 0.350–4.500)

## 2011-09-12 MED ORDER — AMLODIPINE BESYLATE 10 MG PO TABS
10.0000 mg | ORAL_TABLET | Freq: Every day | ORAL | Status: DC
  Administered 2011-09-12 – 2011-09-24 (×13): 10 mg via ORAL
  Filled 2011-09-12 (×13): qty 1

## 2011-09-12 MED ORDER — ACETAMINOPHEN 325 MG PO TABS: 650.0000 mg | ORAL_TABLET | Freq: Four times a day (QID) | ORAL | Status: DC | PRN

## 2011-09-12 MED ORDER — ACETAMINOPHEN 650 MG RE SUPP: 650.0000 mg | Freq: Four times a day (QID) | RECTAL | Status: DC | PRN

## 2011-09-12 MED ORDER — VANCOMYCIN 50 MG/ML ORAL SOLUTION: 125.0000 mg | ORAL | Status: DC

## 2011-09-12 MED ORDER — ENOXAPARIN SODIUM 30 MG/0.3ML ~~LOC~~ SOLN
30.0000 mg | SUBCUTANEOUS | Status: DC
  Administered 2011-09-12 – 2011-09-14 (×3): 30 mg via SUBCUTANEOUS
  Filled 2011-09-12 (×4): qty 0.3

## 2011-09-12 MED ORDER — DIVALPROEX SODIUM 500 MG PO DR TAB
750.0000 mg | DELAYED_RELEASE_TABLET | Freq: Three times a day (TID) | ORAL | Status: DC
  Administered 2011-09-12 – 2011-09-16 (×14): 750 mg via ORAL
  Filled 2011-09-12 (×17): qty 1

## 2011-09-12 MED ORDER — VANCOMYCIN 50 MG/ML ORAL SOLUTION
125.0000 mg | Freq: Two times a day (BID) | ORAL | Status: DC
  Administered 2011-09-19 – 2011-09-24 (×11): 125 mg via ORAL
  Filled 2011-09-12 (×13): qty 2.5

## 2011-09-12 MED ORDER — ALBUTEROL SULFATE (5 MG/ML) 0.5% IN NEBU: 2.5000 mg | INHALATION_SOLUTION | RESPIRATORY_TRACT | Status: DC | PRN

## 2011-09-12 MED ORDER — SODIUM CHLORIDE 0.9 % IV SOLN
INTRAVENOUS | Status: DC
  Administered 2011-09-12 – 2011-09-14 (×4): via INTRAVENOUS

## 2011-09-12 MED ORDER — SODIUM CHLORIDE 0.9 % IJ SOLN
3.0000 mL | Freq: Two times a day (BID) | INTRAMUSCULAR | Status: DC
  Administered 2011-09-12 – 2011-09-24 (×22): 3 mL via INTRAVENOUS

## 2011-09-12 MED ORDER — FERROUS SULFATE 325 (65 FE) MG PO TABS
325.0000 mg | ORAL_TABLET | Freq: Three times a day (TID) | ORAL | Status: DC
  Administered 2011-09-12 – 2011-09-16 (×13): 325 mg via ORAL
  Filled 2011-09-12 (×15): qty 1

## 2011-09-12 MED ORDER — VANCOMYCIN 50 MG/ML ORAL SOLUTION
125.0000 mg | Freq: Four times a day (QID) | ORAL | Status: AC
  Administered 2011-09-12 – 2011-09-18 (×28): 125 mg via ORAL
  Filled 2011-09-12 (×28): qty 2.5

## 2011-09-12 MED ORDER — BOOST / RESOURCE BREEZE PO LIQD
1.0000 | Freq: Three times a day (TID) | ORAL | Status: DC
  Administered 2011-09-12 – 2011-09-16 (×8): 1 via ORAL

## 2011-09-12 MED ORDER — MORPHINE SULFATE 2 MG/ML IJ SOLN: 2.0000 mg | INTRAMUSCULAR | Status: DC | PRN

## 2011-09-12 MED ORDER — ISOSORBIDE MONONITRATE ER 60 MG PO TB24
60.0000 mg | ORAL_TABLET | Freq: Every day | ORAL | Status: DC
  Administered 2011-09-12 – 2011-09-14 (×3): 60 mg via ORAL
  Filled 2011-09-12 (×3): qty 1

## 2011-09-12 MED ORDER — CARVEDILOL 25 MG PO TABS
25.0000 mg | ORAL_TABLET | Freq: Two times a day (BID) | ORAL | Status: DC
  Administered 2011-09-12 – 2011-09-24 (×25): 25 mg via ORAL
  Filled 2011-09-12 (×28): qty 1

## 2011-09-12 MED ORDER — ONDANSETRON HCL 4 MG PO TABS: 4.0000 mg | ORAL_TABLET | Freq: Four times a day (QID) | ORAL | Status: DC | PRN

## 2011-09-12 MED ORDER — ONDANSETRON HCL 4 MG/2ML IJ SOLN: 4.0000 mg | Freq: Three times a day (TID) | INTRAMUSCULAR | Status: DC | PRN

## 2011-09-12 MED ORDER — CLONAZEPAM 0.5 MG PO TABS
0.5000 mg | ORAL_TABLET | Freq: Two times a day (BID) | ORAL | Status: DC
  Administered 2011-09-12 – 2011-09-23 (×25): 0.5 mg via ORAL
  Filled 2011-09-12 (×26): qty 1

## 2011-09-12 MED ORDER — VANCOMYCIN 50 MG/ML ORAL SOLUTION: 125.0000 mg | Freq: Every day | ORAL | Status: DC

## 2011-09-12 MED ORDER — ENOXAPARIN SODIUM 40 MG/0.4ML ~~LOC~~ SOLN: 40.0000 mg | SUBCUTANEOUS | Status: DC

## 2011-09-12 MED ORDER — SODIUM CHLORIDE 0.9 % IV SOLN
INTRAVENOUS | Status: AC
  Administered 2011-09-12: 02:00:00 via INTRAVENOUS

## 2011-09-12 MED ORDER — HYDROCODONE-ACETAMINOPHEN 5-325 MG PO TABS: 1.0000 | ORAL_TABLET | ORAL | Status: DC | PRN

## 2011-09-12 MED ORDER — GUAIFENESIN-DM 100-10 MG/5ML PO SYRP: 5.0000 mL | ORAL_SOLUTION | ORAL | Status: DC | PRN

## 2011-09-12 MED ORDER — ONDANSETRON 8 MG PO TBDP: 8.0000 mg | ORAL_TABLET | Freq: Three times a day (TID) | ORAL | Status: DC | PRN

## 2011-09-12 MED ORDER — ONDANSETRON HCL 4 MG/2ML IJ SOLN: 4.0000 mg | Freq: Four times a day (QID) | INTRAMUSCULAR | Status: DC | PRN

## 2011-09-12 NOTE — Progress Notes (Signed)
Subjective: Diarrhea x2 today, denies abd pain or further vomiting  Objective: Vital signs in last 24 hours: Temp:  [97.5 F (36.4 C)-98.9 F (37.2 C)] 97.5 F (36.4 C) (06/22 0533) Pulse Rate:  [83-98] 90  (06/22 0533) Resp:  [16-18] 18  (06/22 0533) BP: (120-147)/(72-96) 147/72 mmHg (06/22 0533) SpO2:  [96 %-99 %] 98 % (06/22 0533) Weight:  [53.978 kg (119 lb)-57.8 kg (127 lb 6.8 oz)] 57.8 kg (127 lb 6.8 oz) (06/22 0533) Weight change:     Intake/Output from previous day: 06/21 0701 - 06/22 0700 In: 360 [P.O.:360] Out: -      Physical Exam: General: Alert, awake, oriented x to self , in no acute distress. HEENT: No bruits, no goiter. Heart: Regular rate and rhythm, without murmurs, rubs, gallops. Lungs: Clear to auscultation bilaterally. Abdomen: Soft, nontender, distended, positive bowel sounds. Extremities: No clubbing cyanosis or edema with positive pedal pulses. Neuro: Grossly intact, nonfocal.    Lab Results: Basic Metabolic Panel:  Basename 09/12/11 0728 09/11/11 1802  NA 137 137  K 4.5 5.0  CL 105 103  CO2 17* 18*  GLUCOSE 86 89  BUN 83* 83*  CREATININE 2.86* 3.19*  CALCIUM 8.1* 8.4  MG 2.3 --  PHOS 7.0* --   Liver Function Tests:  Basename 09/12/11 0728 09/11/11 1802  AST 18 23  ALT <3 5  ALKPHOS 39 41  BILITOT 0.1* 0.2*  PROT 5.1* 6.1  ALBUMIN 1.8* 2.1*   No results found for this basename: LIPASE:2,AMYLASE:2 in the last 72 hours No results found for this basename: AMMONIA:2 in the last 72 hours CBC:  Basename 09/12/11 0728 09/11/11 1802  WBC 13.4* 13.7*  NEUTROABS -- 10.0*  HGB 10.0* 12.0  HCT 29.8* 36.0  MCV 95.2 95.0  PLT 186 172   Cardiac Enzymes: No results found for this basename: CKTOTAL:3,CKMB:3,CKMBINDEX:3,TROPONINI:3 in the last 72 hours BNP: No results found for this basename: PROBNP:3 in the last 72 hours D-Dimer: No results found for this basename: DDIMER:2 in the last 72 hours CBG: No results found for this  basename: GLUCAP:6 in the last 72 hours Hemoglobin A1C: No results found for this basename: HGBA1C in the last 72 hours Fasting Lipid Panel: No results found for this basename: CHOL,HDL,LDLCALC,TRIG,CHOLHDL,LDLDIRECT in the last 72 hours Thyroid Function Tests: No results found for this basename: TSH,T4TOTAL,FREET4,T3FREE,THYROIDAB in the last 72 hours Anemia Panel: No results found for this basename: VITAMINB12,FOLATE,FERRITIN,TIBC,IRON,RETICCTPCT in the last 72 hours Coagulation: No results found for this basename: LABPROT:2,INR:2 in the last 72 hours Urine Drug Screen: Drugs of Abuse     Component Value Date/Time   LABOPIA NEGATIVE 03/27/2011 0300   LABOPIA NONE DETECTED 03/26/2011 2041   COCAINSCRNUR NEGATIVE 03/27/2011 0300   COCAINSCRNUR NONE DETECTED 03/26/2011 2041   LABBENZ NEGATIVE 03/27/2011 0300   LABBENZ NONE DETECTED 03/26/2011 2041   AMPHETMU NEGATIVE 03/27/2011 0300   AMPHETMU NONE DETECTED 03/26/2011 2041   THCU NONE DETECTED 03/26/2011 2041   LABBARB NONE DETECTED 03/26/2011 2041    Alcohol Level: No results found for this basename: ETH:2 in the last 72 hours Urinalysis:  Basename 09/12/11 0005  COLORURINE YELLOW  LABSPEC 1.017  PHURINE 5.0  GLUCOSEU NEGATIVE  HGBUR LARGE*  BILIRUBINUR NEGATIVE  KETONESUR NEGATIVE  PROTEINUR 30*  UROBILINOGEN 0.2  NITRITE NEGATIVE  LEUKOCYTESUR MODERATE*    Recent Results (from the past 240 hour(s))  URINE CULTURE     Status: Normal   Collection Time   09/05/11  2:54 PM  Component Value Range Status Comment   Specimen Description URINE, CATHETERIZED   Final    Special Requests ADDED ON 09/05/11 AT 1639   Final    Culture  Setup Time [id number]   Final    Colony Count NO GROWTH   Final    Culture NO GROWTH   Final    Report Status 09/06/2011 FINAL   Final   CLOSTRIDIUM DIFFICILE BY PCR     Status: Abnormal   Collection Time   09/12/11  6:10 AM      Component Value Range Status Comment   C difficile by pcr POSITIVE (*)  NEGATIVE Final     Studies/Results: Dg Abd Acute W/chest  09/11/2011  *RADIOLOGY REPORT*  Clinical Data: 21 year old female with renal failure, left abdominal pain and distention.  Vomiting.  ACUTE ABDOMEN SERIES (ABDOMEN 2 VIEW & CHEST 1 VIEW)  Comparison: 08/27/2011 radiographs and 08/24/2011 ET  Findings: Cardiomegaly and pulmonary vascular congestion identified. A small left pleural effusion and left basilar atelectasis again noted. There is no evidence of pneumothorax.  There is no evidence of bowel obstruction or pneumoperitoneum. Increased hazy opacity overlying the abdomen suggests ascites. No suspicious calcifications are present. There is no evidence of acute bony abnormality.  IMPRESSION: Unremarkable bowel gas pattern - no evidence of bowel obstruction or pneumoperitoneum.  Probable ascites.  Cardiomegaly, pulmonary vascular congestion, small left pleural effusion and left basilar atelectasis.  Original Report Authenticated By: Rosendo Gros, M.D.    Medications: Scheduled Meds:   . sodium chloride   Intravenous STAT  . amLODipine  10 mg Oral Daily  . carvedilol  25 mg Oral BID WC  . clonazePAM  0.5 mg Oral BID  . divalproex  750 mg Oral TID  . enoxaparin  30 mg Subcutaneous Q24H  . ferrous sulfate  325 mg Oral TID  . isosorbide mononitrate  60 mg Oral Daily  . sodium chloride  500 mL Intravenous Once  . sodium chloride  3 mL Intravenous Q12H  . vancomycin  125 mg Oral QID   Followed by  . vancomycin  125 mg Oral BID   Followed by  . vancomycin  125 mg Oral Daily   Followed by  . vancomycin  125 mg Oral QODAY   Followed by  . vancomycin  125 mg Oral Q3 days  . DISCONTD: enoxaparin  40 mg Subcutaneous Q24H   Continuous Infusions:  PRN Meds:.acetaminophen, acetaminophen, albuterol, guaiFENesin-dextromethorphan, HYDROcodone-acetaminophen, morphine injection, ondansetron (ZOFRAN) IV, ondansetron, ondansetron, DISCONTD: ondansetron (ZOFRAN) IV  Assessment/Plan: 1. Recurrent  Cdiff colitis: Start Oral Vancomycin per protocol Contact isolation Clear liq diet Mother reports compliance with previous course of Flagyl DC PPI 2. 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  Anoxic brain damage 4. H/o  Seizure disorder, continue depakote 5. H/o NICM-stable, continue coreg, lasix on hold Last echo 1/13, EF of 35-40% Full code   LOS: 1 day   [facility] Pager: [phone] 09/12/2011, 9:46 AM

## 2011-09-12 NOTE — Progress Notes (Signed)
INITIAL ADULT NUTRITION ASSESSMENT Date: 09/12/2011   Time: 4:54 PM Reason for Assessment: MD Consult- poor po  ASSESSMENT: Female 21 y.o.  Dx: Dehydration  Hx:  Past Medical History  Diagnosis Date  . Pneumonia last 2 weeks    'walking pneumonia'  . Seizures   . Anemia   . Heart attack   . Cardiomyopathy   . Heart failure   . CHF (congestive heart failure)   . Ileitis   . Chronic kidney disease   . Renal disorder   . MPGN (membranoproliferative glomerulonephritis), type 2   . Hypertension   . Brain injury    Related Meds:     . sodium chloride   Intravenous STAT  . amLODipine  10 mg Oral Daily  . carvedilol  25 mg Oral BID WC  . clonazePAM  0.5 mg Oral BID  . divalproex  750 mg Oral TID  . enoxaparin  30 mg Subcutaneous Q24H  . ferrous sulfate  325 mg Oral TID  . isosorbide mononitrate  60 mg Oral Daily  . sodium chloride  500 mL Intravenous Once  . sodium chloride  3 mL Intravenous Q12H  . vancomycin  125 mg Oral QID   Followed by  . vancomycin  125 mg Oral BID   Followed by  . vancomycin  125 mg Oral Daily   Followed by  . vancomycin  125 mg Oral QODAY   Followed by  . vancomycin  125 mg Oral Q3 days  . DISCONTD: enoxaparin  40 mg Subcutaneous Q24H   Ht: 5\' 2"  (157.5 cm)  Wt: 99.2 lbs / 45 kg  Ideal Wt: 50 kg % Ideal Wt: 90%  Usual Wt: 130 lbs 1/13 Wt Readings from Last 10 Encounters:  09/12/11 127 lb 6.8 oz (57.8 kg)  09/01/11 119 lb (53.978 kg)  08/25/11 117 lb 8.1 oz (53.3 kg)  07/03/11 107 lb (48.535 kg)  07/01/11 107 lb (48.535 kg)  05/27/11 122 lb 5.7 oz (55.5 kg)  05/07/11 127 lb 13.9 oz (58 kg)  04/28/11 125 lb 3.2 oz (56.79 kg)  04/10/11 153 lb 4.8 oz (69.536 kg)   % Usual Wt: 76%  BMI: 18.1 - Underweight  Food/Nutrition Related Hx: Pt and dad provide hx and report that pt has had 24% wt loss x 6 months due to persistent diarrhea (cdiff) and poor appetite. They describe very poor intake of only some soups, broth, juice, etc PTA. Pt  only drank some juice for lunch today.  Pt meets criteria for severe malnutrition in the context of chronic illness as evidenced by 24% wt loss x 6 months and very poor intake of </= 75% of her estimated needs.   Labs:  CMP     Component Value Date/Time   NA 137 09/12/2011 0728   K 4.5 09/12/2011 0728   CL 105 09/12/2011 0728   CO2 17* 09/12/2011 0728   GLUCOSE 86 09/12/2011 0728   BUN 83* 09/12/2011 0728   CREATININE 2.86* 09/12/2011 0728   CALCIUM 8.1* 09/12/2011 0728   PROT 5.1* 09/12/2011 0728   ALBUMIN 1.8* 09/12/2011 0728   AST 18 09/12/2011 0728   ALT <3 09/12/2011 0728   ALKPHOS 39 09/12/2011 0728   BILITOT 0.1* 09/12/2011 0728   GFRNONAA 22* 09/12/2011 0728   GFRAA 26* 09/12/2011 0728  CBG (last 3)  No results found for this basename: GLUCAP:3 in the last 72 hours   Intake/Output Summary (Last 24 hours) at 09/12/11 1654 Last data filed at  09/12/11 1022  Gross per 24 hour  Intake    360 ml  Output      1 ml  Net    359 ml    Diet Order: Clear Liquid  Supplements/Tube Feeding: none  IVF:     sodium chloride Last Rate: 100 mL/hr at 09/12/11 1536    Estimated Nutritional Needs:   Kcal:  1800-2000 Protein:  75-90 grams Fluid:  >1.8 L/day  NUTRITION DIAGNOSIS: -Malnutrition (NI-5.2).  Status: Ongoing  RELATED TO: chronic diarrhea and poor appetite  AS EVIDENCE BY: 24 % wt loss x 6 months and very poor intake of </= 75% of her estimated needs.  MONITORING/EVALUATION(Goals): Goal: Pt to meet >/= 90% of their estimated nutrition needs Monitor: po intake, weight, I&O, labs, diet advancement  EDUCATION NEEDS: -No education needs identified at this time  INTERVENTION:  Resource TID   DOCUMENTATION CODES Per approved criteria  -Severe malnutrition in the context of chronic illness    Kendell Bane RD, LDN, CNSC [phone] weekend pager  09/12/2011, 4:54 PM

## 2011-09-13 DIAGNOSIS — A0472 Enterocolitis due to Clostridium difficile, not specified as recurrent: Secondary | ICD-10-CM

## 2011-09-13 DIAGNOSIS — I5022 Chronic systolic (congestive) heart failure: Secondary | ICD-10-CM

## 2011-09-13 DIAGNOSIS — R112 Nausea with vomiting, unspecified: Secondary | ICD-10-CM

## 2011-09-13 DIAGNOSIS — N179 Acute kidney failure, unspecified: Secondary | ICD-10-CM

## 2011-09-13 LAB — BASIC METABOLIC PANEL
BUN: 77 mg/dL — ABNORMAL HIGH (ref 6–23)
CO2: 14 mEq/L — ABNORMAL LOW (ref 19–32)
Chloride: 108 mEq/L (ref 96–112)
Creatinine, Ser: 2.49 mg/dL — ABNORMAL HIGH (ref 0.50–1.10)

## 2011-09-13 LAB — CBC
HCT: 26.9 % — ABNORMAL LOW (ref 36.0–46.0)
MCV: 94.7 fL (ref 78.0–100.0)
RBC: 2.84 MIL/uL — ABNORMAL LOW (ref 3.87–5.11)
WBC: 10.9 10*3/uL — ABNORMAL HIGH (ref 4.0–10.5)

## 2011-09-13 LAB — URINE CULTURE: Culture  Setup Time: 201306221222

## 2011-09-13 MED ORDER — CLONIDINE HCL 0.3 MG/24HR TD PTWK
0.3000 mg | MEDICATED_PATCH | TRANSDERMAL | Status: DC
  Administered 2011-09-15: 0.3 mg via TRANSDERMAL
  Filled 2011-09-13: qty 1

## 2011-09-13 NOTE — Progress Notes (Signed)
Subjective: Appears better, diarrhea improved  Objective: Vital signs in last 24 hours: Temp:  [98 F (36.7 C)-98.4 F (36.9 C)] 98.4 F (36.9 C) (06/23 0504) Pulse Rate:  [74-82] 82  (06/23 0504) Resp:  [18] 18  (06/23 0504) BP: (113-134)/(79-87) 134/87 mmHg (06/23 0504) SpO2:  [98 %-99 %] 98 % (06/23 0504) Weight:  [44.997 kg (99 lb 3.2 oz)-59.2 kg (130 lb 8.2 oz)] 59.2 kg (130 lb 8.2 oz) (06/23 0504) Weight change: -8.981 kg (-19 lb 12.8 oz) Last BM Date: 09/12/11  Intake/Output from previous day: 06/22 0701 - 06/23 0700 In: 1520 [P.O.:120; I.V.:1400] Out: 201 [Urine:200; Stool:1]     Physical Exam: General: Alert, awake, oriented x to self , in no acute distress. HEENT: No bruits, no goiter. Heart: Regular rate and rhythm, without murmurs, rubs, gallops. Lungs: Clear to auscultation bilaterally. Abdomen: Soft, nontender, distended, positive bowel sounds. Extremities: No clubbing cyanosis or edema with positive pedal pulses. Neuro: Grossly intact, nonfocal.    Lab Results: Basic Metabolic Panel:  Basename 09/13/11 0637 09/12/11 0728  NA 137 137  K 4.4 4.5  CL 108 105  CO2 14* 17*  GLUCOSE 82 86  BUN 77* 83*  CREATININE 2.49* 2.86*  CALCIUM 7.8* 8.1*  MG -- 2.3  PHOS -- 7.0*   Liver Function Tests:  Basename 09/12/11 0728 09/11/11 1802  AST 18 23  ALT <3 5  ALKPHOS 39 41  BILITOT 0.1* 0.2*  PROT 5.1* 6.1  ALBUMIN 1.8* 2.1*   No results found for this basename: LIPASE:2,AMYLASE:2 in the last 72 hours No results found for this basename: AMMONIA:2 in the last 72 hours CBC:  Basename 09/13/11 0637 09/12/11 0728 09/11/11 1802  WBC 10.9* 13.4* --  NEUTROABS -- -- 10.0*  HGB 9.0* 10.0* --  HCT 26.9* 29.8* --  MCV 94.7 95.2 --  PLT 157 186 --   Cardiac Enzymes: No results found for this basename: CKTOTAL:3,CKMB:3,CKMBINDEX:3,TROPONINI:3 in the last 72 hours BNP: No results found for this basename: PROBNP:3 in the last 72 hours D-Dimer: No results  found for this basename: DDIMER:2 in the last 72 hours CBG: No results found for this basename: GLUCAP:6 in the last 72 hours Hemoglobin A1C: No results found for this basename: HGBA1C in the last 72 hours Fasting Lipid Panel: No results found for this basename: CHOL,HDL,LDLCALC,TRIG,CHOLHDL,LDLDIRECT in the last 72 hours Thyroid Function Tests:  Basename 09/12/11 0728  TSH 7.226*  T4TOTAL --  FREET4 --  T3FREE --  THYROIDAB --   Anemia Panel: No results found for this basename: VITAMINB12,FOLATE,FERRITIN,TIBC,IRON,RETICCTPCT in the last 72 hours Coagulation: No results found for this basename: LABPROT:2,INR:2 in the last 72 hours Urine Drug Screen: Drugs of Abuse     Component Value Date/Time   LABOPIA NEGATIVE 03/27/2011 0300   LABOPIA NONE DETECTED 03/26/2011 2041   COCAINSCRNUR NEGATIVE 03/27/2011 0300   COCAINSCRNUR NONE DETECTED 03/26/2011 2041   LABBENZ NEGATIVE 03/27/2011 0300   LABBENZ NONE DETECTED 03/26/2011 2041   AMPHETMU NEGATIVE 03/27/2011 0300   AMPHETMU NONE DETECTED 03/26/2011 2041   THCU NONE DETECTED 03/26/2011 2041   LABBARB NONE DETECTED 03/26/2011 2041    Alcohol Level: No results found for this basename: ETH:2 in the last 72 hours Urinalysis:  Basename 09/12/11 0005  COLORURINE YELLOW  LABSPEC 1.017  PHURINE 5.0  GLUCOSEU NEGATIVE  HGBUR LARGE*  BILIRUBINUR NEGATIVE  KETONESUR NEGATIVE  PROTEINUR 30*  UROBILINOGEN 0.2  NITRITE NEGATIVE  LEUKOCYTESUR MODERATE*    Recent Results (from the past 240  hour(s))  URINE CULTURE     Status: Normal   Collection Time   09/05/11  2:54 PM      Component Value Range Status Comment   Specimen Description URINE, CATHETERIZED   Final    Special Requests ADDED ON 09/05/11 AT 1639   Final    Culture  Setup Time [id number]   Final    Colony Count NO GROWTH   Final    Culture NO GROWTH   Final    Report Status 09/06/2011 FINAL   Final   CLOSTRIDIUM DIFFICILE BY PCR     Status: Abnormal   Collection Time   09/12/11   6:10 AM      Component Value Range Status Comment   C difficile by pcr POSITIVE (*) NEGATIVE Final     Studies/Results: Dg Abd Acute W/chest  09/11/2011  *RADIOLOGY REPORT*  Clinical Data: 21 year old female with renal failure, left abdominal pain and distention.  Vomiting.  ACUTE ABDOMEN SERIES (ABDOMEN 2 VIEW & CHEST 1 VIEW)  Comparison: 08/27/2011 radiographs and 08/24/2011 ET  Findings: Cardiomegaly and pulmonary vascular congestion identified. A small left pleural effusion and left basilar atelectasis again noted. There is no evidence of pneumothorax.  There is no evidence of bowel obstruction or pneumoperitoneum. Increased hazy opacity overlying the abdomen suggests ascites. No suspicious calcifications are present. There is no evidence of acute bony abnormality.  IMPRESSION: Unremarkable bowel gas pattern - no evidence of bowel obstruction or pneumoperitoneum.  Probable ascites.  Cardiomegaly, pulmonary vascular congestion, small left pleural effusion and left basilar atelectasis.  Original Report Authenticated By: Rosendo Gros, M.D.    Medications: Scheduled Meds:    . sodium chloride   Intravenous STAT  . amLODipine  10 mg Oral Daily  . carvedilol  25 mg Oral BID WC  . clonazePAM  0.5 mg Oral BID  . cloNIDine  0.3 mg Transdermal Weekly  . divalproex  750 mg Oral TID  . enoxaparin  30 mg Subcutaneous Q24H  . feeding supplement  1 Container Oral TID BM  . ferrous sulfate  325 mg Oral TID  . isosorbide mononitrate  60 mg Oral Daily  . sodium chloride  500 mL Intravenous Once  . sodium chloride  3 mL Intravenous Q12H  . vancomycin  125 mg Oral QID   Followed by  . vancomycin  125 mg Oral BID   Followed by  . vancomycin  125 mg Oral Daily   Followed by  . vancomycin  125 mg Oral QODAY   Followed by  . vancomycin  125 mg Oral Q3 days   Continuous Infusions:    . sodium chloride 75 mL/hr at 09/13/11 0659   PRN Meds:.acetaminophen, acetaminophen, albuterol,  guaiFENesin-dextromethorphan, HYDROcodone-acetaminophen, morphine injection, ondansetron (ZOFRAN) IV, ondansetron, ondansetron  Assessment/Plan: 1. Recurrent Cdiff colitis: Continue Oral Vancomycin per protocol Contact isolation Advance to full liquids Mother reports compliance with previous course of Flagyl DC PPI 2. 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  Anoxic brain damage 4. H/o  Seizure disorder, continue depakote 5. H/o NICM-stable, continue coreg, lasix on hold 6. HTN: stable, continue clonidine patch Last echo 1/13, EF of 35-40% Full code   LOS: 2 days   [facility] Pager: [phone] 09/13/2011, 9:51 AM

## 2011-09-13 NOTE — Progress Notes (Signed)
It was noted on assessment patient has a catapress patch to right chest.  Per patient's mother, this patch is changed every Tuesday.  Notified M.Lynch, PA of patch-received orders to continue patch.  Will continue to monitor.   [name]

## 2011-09-14 ENCOUNTER — Inpatient Hospital Stay (HOSPITAL_COMMUNITY): Payer: Medicaid Other

## 2011-09-14 DIAGNOSIS — N179 Acute kidney failure, unspecified: Secondary | ICD-10-CM

## 2011-09-14 DIAGNOSIS — R112 Nausea with vomiting, unspecified: Secondary | ICD-10-CM

## 2011-09-14 DIAGNOSIS — A0472 Enterocolitis due to Clostridium difficile, not specified as recurrent: Secondary | ICD-10-CM

## 2011-09-14 DIAGNOSIS — I5022 Chronic systolic (congestive) heart failure: Secondary | ICD-10-CM

## 2011-09-14 LAB — CBC
HCT: 25 % — ABNORMAL LOW (ref 36.0–46.0)
MCHC: 33.2 g/dL (ref 30.0–36.0)
MCV: 95.1 fL (ref 78.0–100.0)
RDW: 17.7 % — ABNORMAL HIGH (ref 11.5–15.5)
WBC: 10.4 10*3/uL (ref 4.0–10.5)

## 2011-09-14 LAB — BASIC METABOLIC PANEL
BUN: 72 mg/dL — ABNORMAL HIGH (ref 6–23)
Chloride: 107 mEq/L (ref 96–112)
Creatinine, Ser: 2.16 mg/dL — ABNORMAL HIGH (ref 0.50–1.10)
GFR calc Af Amer: 36 mL/min — ABNORMAL LOW (ref >90)

## 2011-09-14 NOTE — Progress Notes (Signed)
Subjective:  diarrhea resolved, abd still distended, tolerating full liq  Objective: Vital signs in last 24 hours: Temp:  [97.5 F (36.4 C)-99.7 F (37.6 C)] 99.2 F (37.3 C) (06/24 0900) Pulse Rate:  [86-90] 90  (06/24 0900) Resp:  [18-20] 18  (06/24 0900) BP: (111-129)/(78-93) 126/93 mmHg (06/24 0900) SpO2:  [96 %-98 %] 97 % (06/24 0900) Weight:  [58.469 kg (128 lb 14.4 oz)] 58.469 kg (128 lb 14.4 oz) (06/24 0437) Weight change: 13.472 kg (29 lb 11.2 oz) Last BM Date: 09/12/11  Intake/Output from previous day: 06/23 0701 - 06/24 0700 In: 1374.2 [P.O.:220; I.V.:1154.2] Out: 201 [Urine:200; Stool:1] Total I/O In: 243 [P.O.:240; I.V.:3] Out: -    Physical Exam: General: Alert, awake, oriented x to self , in no acute distress. HEENT: No bruits, no goiter. Heart: Regular rate and rhythm, without murmurs, rubs, gallops. Lungs: Clear to auscultation bilaterally. Abdomen: Soft, nontender, distended, positive bowel sounds. Extremities: No clubbing cyanosis or edema with positive pedal pulses. Neuro: Grossly intact, nonfocal.    Lab Results: Basic Metabolic Panel:  Basename 09/14/11 0545 09/13/11 0637 09/12/11 0728  NA 136 137 --  K 4.3 4.4 --  CL 107 108 --  CO2 14* 14* --  GLUCOSE 85 82 --  BUN 72* 77* --  CREATININE 2.16* 2.49* --  CALCIUM 8.1* 7.8* --  MG -- -- 2.3  PHOS -- -- 7.0*   Liver Function Tests:  Basename 09/12/11 0728 09/11/11 1802  AST 18 23  ALT <3 5  ALKPHOS 39 41  BILITOT 0.1* 0.2*  PROT 5.1* 6.1  ALBUMIN 1.8* 2.1*   No results found for this basename: LIPASE:2,AMYLASE:2 in the last 72 hours No results found for this basename: AMMONIA:2 in the last 72 hours CBC:  Basename 09/14/11 0545 09/13/11 0637 09/11/11 1802  WBC 10.4 10.9* --  NEUTROABS -- -- 10.0*  HGB 8.3* 9.0* --  HCT 25.0* 26.9* --  MCV 95.1 94.7 --  PLT 126* 157 --   Cardiac Enzymes: No results found for this basename: CKTOTAL:3,CKMB:3,CKMBINDEX:3,TROPONINI:3 in the last  72 hours BNP: No results found for this basename: PROBNP:3 in the last 72 hours D-Dimer: No results found for this basename: DDIMER:2 in the last 72 hours CBG: No results found for this basename: GLUCAP:6 in the last 72 hours Hemoglobin A1C: No results found for this basename: HGBA1C in the last 72 hours Fasting Lipid Panel: No results found for this basename: CHOL,HDL,LDLCALC,TRIG,CHOLHDL,LDLDIRECT in the last 72 hours Thyroid Function Tests:  Basename 09/12/11 0728  TSH 7.226*  T4TOTAL --  FREET4 --  T3FREE --  THYROIDAB --   Anemia Panel: No results found for this basename: VITAMINB12,FOLATE,FERRITIN,TIBC,IRON,RETICCTPCT in the last 72 hours Coagulation: No results found for this basename: LABPROT:2,INR:2 in the last 72 hours Urine Drug Screen: Drugs of Abuse     Component Value Date/Time   LABOPIA NEGATIVE 03/27/2011 0300   LABOPIA NONE DETECTED 03/26/2011 2041   COCAINSCRNUR NEGATIVE 03/27/2011 0300   COCAINSCRNUR NONE DETECTED 03/26/2011 2041   LABBENZ NEGATIVE 03/27/2011 0300   LABBENZ NONE DETECTED 03/26/2011 2041   AMPHETMU NEGATIVE 03/27/2011 0300   AMPHETMU NONE DETECTED 03/26/2011 2041   THCU NONE DETECTED 03/26/2011 2041   LABBARB NONE DETECTED 03/26/2011 2041    Alcohol Level: No results found for this basename: ETH:2 in the last 72 hours Urinalysis:  Basename 09/12/11 0005  COLORURINE YELLOW  LABSPEC 1.017  PHURINE 5.0  GLUCOSEU NEGATIVE  HGBUR LARGE*  BILIRUBINUR NEGATIVE  KETONESUR NEGATIVE  PROTEINUR  30*  UROBILINOGEN 0.2  NITRITE NEGATIVE  LEUKOCYTESUR MODERATE*    Recent Results (from the past 240 hour(s))  URINE CULTURE     Status: Normal   Collection Time   09/05/11  2:54 PM      Component Value Range Status Comment   Specimen Description URINE, CATHETERIZED   Final    Special Requests ADDED ON 09/05/11 AT 1639   Final    Culture  Setup Time [id number]   Final    Colony Count NO GROWTH   Final    Culture NO GROWTH   Final    Report Status  09/06/2011 FINAL   Final   URINE CULTURE     Status: Normal   Collection Time   09/12/11 12:05 AM      Component Value Range Status Comment   Specimen Description URINE, CLEAN CATCH   Final    Special Requests Normal   Final    Culture  Setup Time [id number]   Final    Colony Count 80,000 COLONIES/ML   Final    Culture     Final    Value: Multiple bacterial morphotypes present, none predominant. Suggest appropriate recollection if clinically indicated.   Report Status 09/13/2011 FINAL   Final   CLOSTRIDIUM DIFFICILE BY PCR     Status: Abnormal   Collection Time   09/12/11  6:10 AM      Component Value Range Status Comment   C difficile by pcr POSITIVE (*) NEGATIVE Final     Studies/Results: No results found.  Medications: Scheduled Meds:    . amLODipine  10 mg Oral Daily  . carvedilol  25 mg Oral BID WC  . clonazePAM  0.5 mg Oral BID  . cloNIDine  0.3 mg Transdermal Weekly  . divalproex  750 mg Oral TID  . enoxaparin  30 mg Subcutaneous Q24H  . feeding supplement  1 Container Oral TID BM  . ferrous sulfate  325 mg Oral TID  . sodium chloride  500 mL Intravenous Once  . sodium chloride  3 mL Intravenous Q12H  . vancomycin  125 mg Oral QID   Followed by  . vancomycin  125 mg Oral BID   Followed by  . vancomycin  125 mg Oral Daily   Followed by  . vancomycin  125 mg Oral QODAY   Followed by  . vancomycin  125 mg Oral Q3 days  . DISCONTD: isosorbide mononitrate  60 mg Oral Daily   Continuous Infusions:    . sodium chloride 50 mL/hr at 09/13/11 1659   PRN Meds:.acetaminophen, acetaminophen, albuterol, guaiFENesin-dextromethorphan, HYDROcodone-acetaminophen, morphine injection, ondansetron (ZOFRAN) IV, ondansetron, ondansetron  Assessment/Plan: 1. Recurrent Cdiff colitis: Continue Oral Vancomycin per protocol Contact isolation Continue  full liquids Check Kub since persistent distension Mother reports compliance with previous course of Flagyl DC PPI 2. ARF on  CKD4 Prerenal from diarrhea/vomiting, fluid losses Improved back to baseline, stop IVF Bmet in am 3.  Anoxic brain damage 4. H/o  Seizure disorder, continue depakote 5. H/o NICM-stable, continue coreg, lasix on hold 6. HTN: stable, continue clonidine patch Last echo 1/13, EF of 35-40% Full code   LOS: 3 days   [facility] Pager: [phone] 09/14/2011, 10:41 AM

## 2011-09-15 DIAGNOSIS — R112 Nausea with vomiting, unspecified: Secondary | ICD-10-CM

## 2011-09-15 DIAGNOSIS — N179 Acute kidney failure, unspecified: Secondary | ICD-10-CM

## 2011-09-15 DIAGNOSIS — I5022 Chronic systolic (congestive) heart failure: Secondary | ICD-10-CM

## 2011-09-15 DIAGNOSIS — A0472 Enterocolitis due to Clostridium difficile, not specified as recurrent: Secondary | ICD-10-CM

## 2011-09-15 LAB — CBC
HCT: 25.4 % — ABNORMAL LOW (ref 36.0–46.0)
MCH: 32.5 pg (ref 26.0–34.0)
MCHC: 33.9 g/dL (ref 30.0–36.0)
RDW: 17.6 % — ABNORMAL HIGH (ref 11.5–15.5)

## 2011-09-15 LAB — BASIC METABOLIC PANEL
BUN: 66 mg/dL — ABNORMAL HIGH (ref 6–23)
Calcium: 8.2 mg/dL — ABNORMAL LOW (ref 8.4–10.5)
Creatinine, Ser: 1.96 mg/dL — ABNORMAL HIGH (ref 0.50–1.10)
GFR calc Af Amer: 41 mL/min — ABNORMAL LOW (ref >90)
GFR calc non Af Amer: 35 mL/min — ABNORMAL LOW (ref >90)
Potassium: 4.2 mEq/L (ref 3.5–5.1)

## 2011-09-15 MED ORDER — POLYETHYLENE GLYCOL 3350 17 G PO PACK: 17.0000 g | PACK | Freq: Every day | ORAL | Status: AC | PRN

## 2011-09-15 MED ORDER — VANCOMYCIN 50 MG/ML ORAL SOLUTION: 125.0000 mg | Freq: Four times a day (QID) | ORAL | Status: AC

## 2011-09-15 MED ORDER — POLYETHYLENE GLYCOL 3350 17 G PO PACK
17.0000 g | PACK | Freq: Two times a day (BID) | ORAL | Status: DC
  Administered 2011-09-15 – 2011-09-16 (×3): 17 g via ORAL
  Filled 2011-09-15 (×4): qty 1

## 2011-09-15 MED ORDER — ENOXAPARIN SODIUM 40 MG/0.4ML ~~LOC~~ SOLN
40.0000 mg | SUBCUTANEOUS | Status: DC
  Administered 2011-09-15 – 2011-09-23 (×9): 40 mg via SUBCUTANEOUS
  Filled 2011-09-15 (×10): qty 0.4

## 2011-09-15 NOTE — Progress Notes (Signed)
Subjective:  diarrhea resolved, Abd still distended, no BM in 2 days, tolerating reg diet since yesterday,   Objective: Vital signs in last 24 hours: Temp:  [97.7 F (36.5 C)-99.2 F (37.3 C)] 97.7 F (36.5 C) (06/25 0500) Pulse Rate:  [85-90] 86  (06/25 0500) Resp:  [16-18] 16  (06/25 0500) BP: (115-130)/(82-93) 119/85 mmHg (06/25 0500) SpO2:  [96 %-100 %] 96 % (06/25 0500) Weight:  [59.285 kg (130 lb 11.2 oz)] 59.285 kg (130 lb 11.2 oz) (06/25 0500) Weight change: 0.816 kg (1 lb 12.8 oz) Last BM Date: 09/12/11  Intake/Output from previous day: 06/24 0701 - 06/25 0700 In: 789.2 [P.O.:360; I.V.:429.2] Out: 500 [Urine:500]     Physical Exam: General: Alert, awake, oriented x to self , in no acute distress. HEENT: No bruits, no goiter. Heart: Regular rate and rhythm, without murmurs, rubs, gallops. Lungs: Clear to auscultation bilaterally. Abdomen: Soft, nontender, distended, positive bowel sounds. Extremities: No clubbing cyanosis or edema with positive pedal pulses. Neuro: Grossly intact, nonfocal.    Lab Results: Basic Metabolic Panel:  Basename 09/15/11 0625 09/14/11 0545  NA 136 136  K 4.2 4.3  CL 109 107  CO2 16* 14*  GLUCOSE 91 85  BUN 66* 72*  CREATININE 1.96* 2.16*  CALCIUM 8.2* 8.1*  MG -- --  PHOS -- --   Liver Function Tests: No results found for this basename: AST:2,ALT:2,ALKPHOS:2,BILITOT:2,PROT:2,ALBUMIN:2 in the last 72 hours No results found for this basename: LIPASE:2,AMYLASE:2 in the last 72 hours No results found for this basename: AMMONIA:2 in the last 72 hours CBC:  Basename 09/15/11 0625 09/14/11 0545  WBC 11.0* 10.4  NEUTROABS -- --  HGB 8.6* 8.3*  HCT 25.4* 25.0*  MCV 95.8 95.1  PLT 143* 126*   Cardiac Enzymes: No results found for this basename: CKTOTAL:3,CKMB:3,CKMBINDEX:3,TROPONINI:3 in the last 72 hours BNP: No results found for this basename: PROBNP:3 in the last 72 hours D-Dimer: No results found for this basename:  DDIMER:2 in the last 72 hours CBG: No results found for this basename: GLUCAP:6 in the last 72 hours Hemoglobin A1C: No results found for this basename: HGBA1C in the last 72 hours Fasting Lipid Panel: No results found for this basename: CHOL,HDL,LDLCALC,TRIG,CHOLHDL,LDLDIRECT in the last 72 hours Thyroid Function Tests: No results found for this basename: TSH,T4TOTAL,FREET4,T3FREE,THYROIDAB in the last 72 hours Anemia Panel: No results found for this basename: VITAMINB12,FOLATE,FERRITIN,TIBC,IRON,RETICCTPCT in the last 72 hours Coagulation: No results found for this basename: LABPROT:2,INR:2 in the last 72 hours Urine Drug Screen: Drugs of Abuse     Component Value Date/Time   LABOPIA NEGATIVE 03/27/2011 0300   LABOPIA NONE DETECTED 03/26/2011 2041   COCAINSCRNUR NEGATIVE 03/27/2011 0300   COCAINSCRNUR NONE DETECTED 03/26/2011 2041   LABBENZ NEGATIVE 03/27/2011 0300   LABBENZ NONE DETECTED 03/26/2011 2041   AMPHETMU NEGATIVE 03/27/2011 0300   AMPHETMU NONE DETECTED 03/26/2011 2041   THCU NONE DETECTED 03/26/2011 2041   LABBARB NONE DETECTED 03/26/2011 2041    Alcohol Level: No results found for this basename: ETH:2 in the last 72 hours Urinalysis: No results found for this basename: COLORURINE:2,APPERANCEUR:2,LABSPEC:2,PHURINE:2,GLUCOSEU:2,HGBUR:2,BILIRUBINUR:2,KETONESUR:2,PROTEINUR:2,UROBILINOGEN:2,NITRITE:2,LEUKOCYTESUR:2 in the last 72 hours  Recent Results (from the past 240 hour(s))  URINE CULTURE     Status: Normal   Collection Time   09/05/11  2:54 PM      Component Value Range Status Comment   Specimen Description URINE, CATHETERIZED   Final    Special Requests ADDED ON 09/05/11 AT 1639   Final    Culture  Setup Time [id number]   Final    Colony Count NO GROWTH   Final    Culture NO GROWTH   Final    Report Status 09/06/2011 FINAL   Final   URINE CULTURE     Status: Normal   Collection Time   09/12/11 12:05 AM      Component Value Range Status Comment   Specimen Description  URINE, CLEAN CATCH   Final    Special Requests Normal   Final    Culture  Setup Time [id number]   Final    Colony Count 80,000 COLONIES/ML   Final    Culture     Final    Value: Multiple bacterial morphotypes present, none predominant. Suggest appropriate recollection if clinically indicated.   Report Status 09/13/2011 FINAL   Final   CLOSTRIDIUM DIFFICILE BY PCR     Status: Abnormal   Collection Time   09/12/11  6:10 AM      Component Value Range Status Comment   C difficile by pcr POSITIVE (*) NEGATIVE Final     Studies/Results: Dg Abd 1 View  09/14/2011  *RADIOLOGY REPORT*  Clinical Data: Abdominal distension.  ABDOMEN - 1 VIEW  Comparison: Abdominal radiograph 09/11/2011.  Findings: There is a small amount of gas and stool scattered throughout the colon extending to the rectum.  No pathologic distension of small bowel is noted.  No gross evidence of pneumoperitoneum is appreciated on this single supine view of the abdomen.  IMPRESSION: 1.  Nonobstructive bowel gas pattern.  No pneumoperitoneum.  Original Report Authenticated By: Florencia Reasons, M.D.    Medications: Scheduled Meds:    . amLODipine  10 mg Oral Daily  . carvedilol  25 mg Oral BID WC  . clonazePAM  0.5 mg Oral BID  . cloNIDine  0.3 mg Transdermal Weekly  . divalproex  750 mg Oral TID  . enoxaparin  30 mg Subcutaneous Q24H  . feeding supplement  1 Container Oral TID BM  . ferrous sulfate  325 mg Oral TID  . polyethylene glycol  17 g Oral BID  . sodium chloride  500 mL Intravenous Once  . sodium chloride  3 mL Intravenous Q12H  . vancomycin  125 mg Oral QID   Followed by  . vancomycin  125 mg Oral BID   Followed by  . vancomycin  125 mg Oral Daily   Followed by  . vancomycin  125 mg Oral QODAY   Followed by  . vancomycin  125 mg Oral Q3 days  . DISCONTD: isosorbide mononitrate  60 mg Oral Daily   Continuous Infusions:    . DISCONTD: sodium chloride 20 mL/hr at 09/14/11 1204   PRN  Meds:.acetaminophen, acetaminophen, albuterol, guaiFENesin-dextromethorphan, HYDROcodone-acetaminophen, morphine injection, ondansetron (ZOFRAN) IV, ondansetron, ondansetron  Assessment/Plan: 1. Recurrent Cdiff colitis:diarrhea resolved Continue Oral Vancomycin per protocol Contact isolation Diet advanced to regular Persistent Distension, constipated now,  KuB yesterday unremarkable-will add Miralax Mother reports compliance with previous course of Flagyl DC PPI 2. 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 Fluctuations from hemo-concentrated state then hemodilution 4. Metabolic acidosis: from ARF and Initial GI losses: improving 5.  Anoxic brain damage  6. H/o  Seizure disorder, continue depakote 7. H/o NICM-stable, continue coreg, lasix on hold, Last echo 1/13, EF of 35-40% 8. HTN: stable, continue clonidine patch Full code Dispo: home in 1-2 days   LOS: 4 days   [facility] Pager: [phone] 09/15/2011, 8:27 AM

## 2011-09-15 NOTE — Discharge Summary (Addendum)
Physician Discharge Summary  Patient ID: Heather Sims MRN: [id number] DOB/AGE: Mar 16, 1991 21 y.o.  Admit date: 09/11/2011 Discharge date: 09/15/2011  Primary Care Physician:  Jaclyn Shaggy, MD   Discharge Diagnoses:   1. Recurrent Cdiff colitis 2. H/o Anoxic brain damage 3. Anemia from Iron deficiency/chronic kidney disease 4. 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 Abdominal distension 6.  Hypoalbuminemia 7. Seizure disorder, grand mal 8. Metabolic acidosis    Medication List  As of 09/15/2011 10:01 PM   STOP taking these medications         isosorbide mononitrate 60 MG 24 hr tablet      ondansetron 8 MG disintegrating tablet         TAKE these medications         amLODipine 10 MG tablet   Commonly known as: NORVASC   Take 10 mg by mouth daily.      carvedilol 25 MG tablet   Commonly known as: COREG   Take 25 mg by mouth 2 (two) times daily with a meal.      clonazePAM 0.5 MG tablet   Commonly known as: KLONOPIN   Take 0.5 mg by mouth 2 (two) times daily.      cloNIDine 0.3 mg/24hr   Commonly known as: CATAPRES - Dosed in mg/24 hr   Place 1 patch onto the skin once a week. Apply on Tuesdays.      darbepoetin 60 MCG/0.3ML Soln   Commonly known as: ARANESP   Inject 60 mcg into the skin every Wednesday at 6 PM.      divalproex 250 MG DR tablet   Commonly known as: DEPAKOTE   Take 750 mg by mouth 3 (three) times daily.      ferrous sulfate 325 (65 FE) MG tablet   Take 325 mg by mouth 3 (three) times daily.      furosemide 80 MG tablet   Commonly known as: LASIX   Take 80 mg by mouth daily.      polyethylene glycol packet   Commonly known as: MIRALAX / GLYCOLAX   Take 17 g by mouth daily as needed (constipation).      vancomycin 50 mg/mL oral solution   Commonly known as: VANCOCIN   Take 2.5 mLs (125 mg total) by mouth 4 (four) times daily. For 11 days           Disposition and Follow-up:  PCP in 1 week  Consults:  none  Significant Diagnostic Studies:  Dg Abd 1 View  09/14/2011  *RADIOLOGY REPORT*  Clinical Data: Abdominal distension.  ABDOMEN - 1 VIEW  Comparison: Abdominal radiograph 09/11/2011.  Findings: There is a small amount of gas and stool scattered throughout the colon extending to the rectum.  No pathologic distension of small bowel is noted.  No gross evidence of pneumoperitoneum is appreciated on this single supine view of the abdomen.  IMPRESSION: 1.  Nonobstructive bowel gas pattern.  No pneumoperitoneum.  Original Report Authenticated By: Florencia Reasons, M.D.    Brief H and P: Heather Sims is a 21 y.o. female has a past medical history of Pneumonia (last 2 weeks); Seizures; Anemia; Heart attack; Cardiomyopathy; Heart failure; CHF (congestive heart failure); Ileitis; Chronic kidney disease; Renal disorder; MPGN (membranoproliferative glomerulonephritis), type 2; Hypertension; and Brain injury.  Presented with 1 wk history of diarrhea now improving , but continues to have nausea and vomiting. She has history of C.diff colitis and she finished her course  of Flagyl. Complains of abdominal pain. She has hard time keeping fluids down but abble if given zofran.  Denies fever  Hospital Course:  1. Recurrent Cdiff colitis:diarrhea resolved  Continue Oral Vancomycin for 11 more days for a 14 day course Contact isolation  Diet advanced to regular  Persistent Distension, constipated now, KuB yesterday unremarkable- Miralax added today, but clinically improving Mother reports compliance with previous course of Flagyl  Discontinued PPI  2. ARF on CKD4  Prerenal from diarrhea/vomiting, fluid losses  Improved back to baseline, treated with IVF, discontinue today 3. Anemia from Iron deficiency/CKD: Hb last admission in early June, Hb ranged from 5.8-6.6, was transfused 3 units and Hb was 8.9 at DC, was also seen by [facility] this month, advised to FU as outpatient, Hb stable compared to Hb at DC  earlier this month.  Fluctuations from hemo-concentrated state then hemodilution  4. Metabolic acidosis: from ARF and Initial GI losses: improving  5. Anoxic brain damage  6. H/o Seizure disorder, continue depakote  7. H/o NICM-stable, continue coreg, lasix on hold, Last echo 1/13, EF of 35-40%, Lasix can be resumed at discharge 8. HTN: stable, continue clonidine patch    Time spent on Discharge:  Signed: Ilean Spradlin [facility]  09/15/2011, 10:01 PM

## 2011-09-15 NOTE — Progress Notes (Signed)
I cosign for Ninetta Lights RN on assessments, med administration, and notes.

## 2011-09-16 DIAGNOSIS — A0472 Enterocolitis due to Clostridium difficile, not specified as recurrent: Secondary | ICD-10-CM

## 2011-09-16 DIAGNOSIS — E860 Dehydration: Secondary | ICD-10-CM

## 2011-09-16 DIAGNOSIS — I10 Essential (primary) hypertension: Secondary | ICD-10-CM

## 2011-09-16 DIAGNOSIS — N179 Acute kidney failure, unspecified: Secondary | ICD-10-CM

## 2011-09-16 LAB — CBC
Hemoglobin: 9.5 g/dL — ABNORMAL LOW (ref 12.0–15.0)
MCHC: 33.7 g/dL (ref 30.0–36.0)
Platelets: 162 10*3/uL (ref 150–400)
RDW: 17.1 % — ABNORMAL HIGH (ref 11.5–15.5)

## 2011-09-16 LAB — BASIC METABOLIC PANEL
Calcium: 8.4 mg/dL (ref 8.4–10.5)
GFR calc Af Amer: 44 mL/min — ABNORMAL LOW (ref >90)
GFR calc non Af Amer: 38 mL/min — ABNORMAL LOW (ref >90)
Potassium: 4.5 mEq/L (ref 3.5–5.1)
Sodium: 135 mEq/L (ref 135–145)

## 2011-09-16 MED ORDER — METRONIDAZOLE IN NACL 5-0.79 MG/ML-% IV SOLN
500.0000 mg | Freq: Three times a day (TID) | INTRAVENOUS | Status: DC
  Administered 2011-09-16 – 2011-09-21 (×15): 500 mg via INTRAVENOUS
  Filled 2011-09-16 (×18): qty 100

## 2011-09-16 MED ORDER — LOSARTAN POTASSIUM 25 MG PO TABS
25.0000 mg | ORAL_TABLET | Freq: Every day | ORAL | Status: DC
  Administered 2011-09-16 – 2011-09-17 (×2): 25 mg via ORAL
  Filled 2011-09-16 (×3): qty 1

## 2011-09-16 MED ORDER — NEPRO/CARBSTEADY PO LIQD
237.0000 mL | Freq: Every day | ORAL | Status: DC | PRN
  Filled 2011-09-16: qty 237

## 2011-09-16 MED ORDER — SODIUM BICARBONATE 650 MG PO TABS
650.0000 mg | ORAL_TABLET | Freq: Two times a day (BID) | ORAL | Status: DC
  Administered 2011-09-16 – 2011-09-17 (×4): 650 mg via ORAL
  Filled 2011-09-16 (×6): qty 1

## 2011-09-16 MED ORDER — DIVALPROEX SODIUM 500 MG PO DR TAB
750.0000 mg | DELAYED_RELEASE_TABLET | Freq: Two times a day (BID) | ORAL | Status: DC
  Administered 2011-09-16 – 2011-09-23 (×15): 750 mg via ORAL
  Filled 2011-09-16 (×18): qty 1

## 2011-09-16 NOTE — Progress Notes (Signed)
Assessment/Plan: 1. Recurrent Cdiff colitis:diarrhea resolved, but now patient is developing an ileus - I am concerned she may be having ileus and po meds are not reaching the target. Start iv flagyl and c/w po Vancomycin Continue Oral Vancomycin per protocol DCed PPI. Will change antihypertensive from Clonidine which may be contributing to the ileus   2. 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 Prerenal from diarrhea/vomiting, fluid losses. Improved back to baseline, after iv fluids. D/w with Dr. Caryn Section and we will attempt to substitute clonidine with losartan. We will monitor BMET closely.   3. 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 Fluctuations from hemo-concentrated state then hemodilution  4. Metabolic acidosis: - due to GI loses . Patient does not have acidosis at baseline from CKD. Start po bicarbonate and follow closely.   5.  Anoxic brain injury -  6. H/o  Seizure disorder, continue depakote, but decrease dose on 09/16/11 due to sedation  7. H/o NICM-stable, continue coreg, lasix on hold, Last echo 1/13, EF of 35-40%  8. HTN: see above   Full code  LOS:  5   Heather Sims [facility] Pager: [phone]  09/16/2011, 1:08 PM      Subjective: Abdomen very distended, patient is lethargic    Objective: Vital signs in last 24 hours: Temp:  [97.7 F (36.5 C)-98.1 F (36.7 C)] 98 F (36.7 C) (06/26 0412) Pulse Rate:  [87-90] 89  (06/26 0645) Resp:  [16-20] 20  (06/26 0412) BP: (124-145)/(82-105) 125/85 mmHg (06/26 0645) SpO2:  [94 %-96 %] 95 % (06/26 0412) Weight:  [58.5 kg (128 lb 15.5 oz)] 58.5 kg (128 lb 15.5 oz) (06/26 0412) Weight change: -0.785 kg (-1 lb 11.7 oz) Last BM Date: 09/12/11 Patient Vitals for the past 24 hrs:  BP Temp Temp src Pulse Resp SpO2 Weight  09/16/11 0645 125/85  mmHg - - 89  - - -  09/16/11 0412 145/105 mmHg 98 F (36.7 C) Oral 90  20  95 % 58.5 kg (128 lb 15.5 oz)  09/15/11 2158 135/94 mmHg 98.1 F (36.7 C) Oral 87  18  94 % -  09/15/11 1500 124/82 mmHg 97.7 F (36.5 C) Axillary 88  16  96 % -    Intake/Output from previous day: 06/25 0701 - 06/26 0700 In: 603 [P.O.:600; I.V.:3] Out: 300 [Urine:300]     Physical Exam: General: Lethargic, oriented x to self , in no acute distress. Heart: Regular rate and rhythm, without murmurs, rubs, gallops. Lungs: Clear to auscultation bilaterally. Abdomen: Soft, minimal difusse tenderness, distended, no bowel sounds. Hirsutism    Lab Results: Basic Metabolic Panel:  Basename 09/16/11 0536 09/15/11 0625  NA 135 136  K 4.5 4.2  CL 108 109  CO2 16* 16*  GLUCOSE 88 91  BUN 62* 66*  CREATININE 1.84* 1.96*  CALCIUM 8.4 8.2*  MG -- --  PHOS -- --   Liver Function Tests: No results found for this basename: AST:2,ALT:2,ALKPHOS:2,BILITOT:2,PROT:2,ALBUMIN:2 in the last 72 hours No results found for this basename: LIPASE:2,AMYLASE:2 in the last 72 hours No results found for this basename: AMMONIA:2 in the last 72 hours CBC:  Basename 09/16/11 0536 09/15/11 0625  WBC 12.4* 11.0*  NEUTROABS -- --  HGB 9.5* 8.6*  HCT 28.2* 25.4*  MCV 94.6 95.8  PLT 162  143*  Urine Drug Screen: Drugs of Abuse     Component Value Date/Time   LABOPIA NEGATIVE 03/27/2011 0300   LABOPIA NONE DETECTED 03/26/2011 2041   COCAINSCRNUR NEGATIVE 03/27/2011 0300   COCAINSCRNUR NONE DETECTED 03/26/2011 2041   LABBENZ NEGATIVE 03/27/2011 0300   LABBENZ NONE DETECTED 03/26/2011 2041   AMPHETMU NEGATIVE 03/27/2011 0300   AMPHETMU NONE DETECTED 03/26/2011 2041   THCU NONE DETECTED 03/26/2011 2041   LABBARB NONE DETECTED 03/26/2011 2041    Alcohol Level: No results found for this basename: ETH:2 in the last 72 hours Urinalysis: No results found for this basename:  COLORURINE:2,APPERANCEUR:2,LABSPEC:2,PHURINE:2,GLUCOSEU:2,HGBUR:2,BILIRUBINUR:2,KETONESUR:2,PROTEINUR:2,UROBILINOGEN:2,NITRITE:2,LEUKOCYTESUR:2 in the last 72 hours  Recent Results (from the past 240 hour(s))  URINE CULTURE     Status: Normal   Collection Time   09/12/11 12:05 AM      Component Value Range Status Comment   Specimen Description URINE, CLEAN CATCH   Final    Special Requests Normal   Final    Culture  Setup Time [id number]   Final    Colony Count 80,000 COLONIES/ML   Final    Culture     Final    Value: Multiple bacterial morphotypes present, none predominant. Suggest appropriate recollection if clinically indicated.   Report Status 09/13/2011 FINAL   Final   CLOSTRIDIUM DIFFICILE BY PCR     Status: Abnormal   Collection Time   09/12/11  6:10 AM      Component Value Range Status Comment   C difficile by pcr POSITIVE (*) NEGATIVE Final     Studies/Results: No results found.  Medications: Scheduled Meds:    . amLODipine  10 mg Oral Daily  . carvedilol  25 mg Oral BID WC  . clonazePAM  0.5 mg Oral BID  . divalproex  750 mg Oral Q12H  . enoxaparin  40 mg Subcutaneous Q24H  . losartan  25 mg Oral Daily  . metronidazole  500 mg Intravenous Q8H  . sodium bicarbonate  650 mg Oral BID  . sodium chloride  3 mL Intravenous Q12H  . vancomycin  125 mg Oral QID   Followed by  . vancomycin  125 mg Oral BID   Followed by  . vancomycin  125 mg Oral Daily   Followed by  . vancomycin  125 mg Oral QODAY   Followed by  . vancomycin  125 mg Oral Q3 days  . DISCONTD: cloNIDine  0.3 mg Transdermal Weekly  . DISCONTD: divalproex  750 mg Oral TID  . DISCONTD: feeding supplement  1 Container Oral TID BM  . DISCONTD: ferrous sulfate  325 mg Oral TID  . DISCONTD: polyethylene glycol  17 g Oral BID  . DISCONTD: sodium chloride  500 mL Intravenous Once   Continuous Infusions:   PRN Meds:.acetaminophen, acetaminophen, albuterol, feeding supplement (NEPRO CARB STEADY),  ondansetron (ZOFRAN) IV, ondansetron, ondansetron, DISCONTD: guaiFENesin-dextromethorphan, DISCONTD: HYDROcodone-acetaminophen, DISCONTD:  morphine injection

## 2011-09-16 NOTE — Progress Notes (Signed)
Nutrition Follow-up  Pt advanced to Regular diet on 6/24. Abdomen distended. Tolerating Regular diet. No BM in 2 days. Pt does not like [employer].  RD educated pt on more appropriate renal diet choices, noted dietary recall reveals dark sodas, potatoes, and chocolate. Mother verbalizes that daughter needs to watch her potassium intake, but does not adhere to restrictions. Discussed current renal labs.  Diet Order:  Regular Supplement: Resource Breeze PO TID  Meds: Scheduled Meds:   . amLODipine  10 mg Oral Daily  . carvedilol  25 mg Oral BID WC  . clonazePAM  0.5 mg Oral BID  . cloNIDine  0.3 mg Transdermal Weekly  . divalproex  750 mg Oral TID  . enoxaparin  40 mg Subcutaneous Q24H  . feeding supplement  1 Container Oral TID BM  . ferrous sulfate  325 mg Oral TID  . polyethylene glycol  17 g Oral BID  . sodium chloride  500 mL Intravenous Once  . sodium chloride  3 mL Intravenous Q12H  . vancomycin  125 mg Oral QID   Followed by  . vancomycin  125 mg Oral BID   Followed by  . vancomycin  125 mg Oral Daily   Followed by  . vancomycin  125 mg Oral QODAY   Followed by  . vancomycin  125 mg Oral Q3 days   Continuous Infusions:  PRN Meds:.acetaminophen, acetaminophen, albuterol, guaiFENesin-dextromethorphan, HYDROcodone-acetaminophen, morphine injection, ondansetron (ZOFRAN) IV, ondansetron, ondansetron  Labs:  CMP     Component Value Date/Time   NA 135 09/16/2011 0536   K 4.5 09/16/2011 0536   CL 108 09/16/2011 0536   CO2 16* 09/16/2011 0536   GLUCOSE 88 09/16/2011 0536   BUN 62* 09/16/2011 0536   CREATININE 1.84* 09/16/2011 0536   CALCIUM 8.4 09/16/2011 0536   PROT 5.1* 09/12/2011 0728   ALBUMIN 1.8* 09/12/2011 0728   AST 18 09/12/2011 0728   ALT <3 09/12/2011 0728   ALKPHOS 39 09/12/2011 0728   BILITOT 0.1* 09/12/2011 0728   GFRNONAA 38* 09/16/2011 0536   GFRAA 44* 09/16/2011 0536   Phosphorus  Date/Time Value Range Status  09/12/2011  7:28 AM 7.0* 2.3 - 4.6 mg/dL Final    11/26/452  0:98 AM 5.4* 2.3 - 4.6 mg/dL Final  03/23/9145  8:29 AM 6.0* 2.3 - 4.6 mg/dL Final    Intake/Output Summary (Last 24 hours) at 09/16/11 1152 Last data filed at 09/15/11 2235  Gross per 24 hour  Intake    483 ml  Output    300 ml  Net    183 ml    Weight Status:  58.5 kg - wt up 13.5 kg x 4 days  Re-estimated needs:  1800 - 2000 kcal, 60 - 70 grams protein  Nutrition Dx:  Malnutrition r/t chronic diarrhea and poor appetite AEB 24% wt loss x 6 months and very poor intake of </= 75% of her estimated needs. Ongoing.  Goal:  Pt to meet >/= 90% of their estimated nutrition needs. Unmet.  Intervention:   1. RD provided Nepro Shakes for pt to try  2. Will discontinue [employer], pt does not like 3. Add Nepro PRN 4. Education on renal-friendly foods completed 5. RD to continue to follow nutrition care plan  Monitor:  po intake, weight, I&O, labs  Jarold Motto MS, RD, LDN Pager#: [phone]

## 2011-09-16 NOTE — Progress Notes (Signed)
   CARE MANAGEMENT NOTE 09/16/2011  Patient:  ANAY, WALTER   Account Number:  [id number]  Date Initiated:  09/16/2011  Documentation initiated by:  Tera Mater  Subjective/Objective Assessment:   21yo female admitted with dehydration. Pt. lives with mother at home     Action/Plan:   Discharge planning   Anticipated DC Date:  09/17/2011   Anticipated DC Plan:  HOME/SELF CARE      DC Planning Services  CM consult      Choice offered to / List presented to:             Status of service:  In process, will continue to follow Medicare Important Message given?   (If response is "NO", the following Medicare IM given date fields will be blank) Date Medicare IM given:   Date Additional Medicare IM given:    Discharge Disposition:    Per UR Regulation:  Reviewed for med. necessity/level of care/duration of stay  If discussed at Long Length of Stay Meetings, dates discussed:    Comments:  09/16/11 1500 NCM placed a benefits check for Vancomycin 125mg  po QID sent today to inquire how much Medicaid will pay for.  Anticipated discharge is 1-2 days.  Will follow pt. for further discharge needs. Tera Mater, RN, [name] NCM [phone]

## 2011-09-17 ENCOUNTER — Ambulatory Visit: Payer: Medicaid Other | Admitting: Physical Therapy

## 2011-09-17 DIAGNOSIS — N179 Acute kidney failure, unspecified: Secondary | ICD-10-CM

## 2011-09-17 DIAGNOSIS — E860 Dehydration: Secondary | ICD-10-CM

## 2011-09-17 DIAGNOSIS — A0472 Enterocolitis due to Clostridium difficile, not specified as recurrent: Secondary | ICD-10-CM

## 2011-09-17 LAB — CBC
HCT: 26.4 % — ABNORMAL LOW (ref 36.0–46.0)
Hemoglobin: 8.8 g/dL — ABNORMAL LOW (ref 12.0–15.0)
RDW: 17.1 % — ABNORMAL HIGH (ref 11.5–15.5)
WBC: 10.9 10*3/uL — ABNORMAL HIGH (ref 4.0–10.5)

## 2011-09-17 LAB — BASIC METABOLIC PANEL
Chloride: 111 mEq/L (ref 96–112)
GFR calc Af Amer: 48 mL/min — ABNORMAL LOW (ref >90)
GFR calc non Af Amer: 42 mL/min — ABNORMAL LOW (ref >90)
Potassium: 4.6 mEq/L (ref 3.5–5.1)

## 2011-09-17 NOTE — Progress Notes (Signed)
   CARE MANAGEMENT NOTE 09/17/2011  Patient:  Heather Sims, Heather Sims   Account Number:  [id number]  Date Initiated:  09/16/2011  Documentation initiated by:  Tera Mater  Subjective/Objective Assessment:   21yo female admitted with dehydration. Pt. lives with mother at home     Action/Plan:   Discharge planning   Anticipated DC Date:  09/17/2011   Anticipated DC Plan:  HOME/SELF CARE      DC Planning Services  CM consult      Choice offered to / List presented to:             Status of service:  In process, will continue to follow Medicare Important Message given?   (If response is "NO", the following Medicare IM given date fields will be blank) Date Medicare IM given:   Date Additional Medicare IM given:    Discharge Disposition:    Per UR Regulation:  Reviewed for med. necessity/level of care/duration of stay  If discussed at Long Length of Stay Meetings, dates discussed:    Comments:  09/17/11 1000 Noted that Medicaid will pay for Vancomycin po in capsule form.  Made Dr. Lavera Guise aware of this information. Tera Mater, RN, [name] NCM [phone]   09/16/11 1500 NCM placed a benefits check for Vancomycin 125mg  po QID sent today to inquire how much Medicaid will pay for.  Anticipated discharge is 1-2 days.  Will follow pt. for further discharge needs. Tera Mater, RN, [name] NCM [phone]

## 2011-09-17 NOTE — Progress Notes (Signed)
Assessment/Plan: 1. Recurrent Cdiff colitis:diarrhea resolved, but now patient is developing abdominal distension  - I am concerned she may be having ileus and po meds are not reaching the target. Started iv flagyl on 09/16/11. Patient was on po Vancomycin since admission  DCed PPI. Will change antihypertensive from Clonidine which may be contributing to the ileus   2. 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 Prerenal from diarrhea/vomiting, fluid losses. Improved back to baseline, after iv fluids. D/w with Dr. Caryn Section and we substituted clonidine with losartan on 09/16/11. Monitor BMET closely.   3. 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 Fluctuations from hemo-concentrated state then hemodilution  4. Metabolic acidosis: - due to GI loses . Patient does not have acidosis at baseline from CKD. Started po bicarbonate 09/16/11 and follow closely.   5.  Anoxic brain injury - recovered some functionality after event 03/2011  6. H/o  Seizure disorder, continue depakote, but decreased dose on 09/16/11 due to sedation  7. H/o NICM-stable, continue coreg, lasix on hold, Last echo 1/13, EF of 35-40%  8. HTN: see above   Full code  LOS:  6   Cerra Eisenhower [facility] Pager: [phone]  09/17/2011,      Subjective: Patient looks better today   Objective: Vital signs in last 24 hours: Temp:  [97.3 F (36.3 C)-98.5 F (36.9 C)] 98.4 F (36.9 C) (06/27 0645) Pulse Rate:  [83-86] 86  (06/27 0645) Resp:  [18-20] 18  (06/27 0645) BP: (121-145)/(85-98) 140/96 mmHg (06/27 0645) SpO2:  [95 %-99 %] 99 % (06/27 0645) Weight:  [58.6 kg (129 lb 3 oz)] 58.6 kg (129 lb 3 oz) (06/27 0700) Weight change: 0.1 kg (3.5 oz) Last BM Date: 09/16/11 Patient Vitals for the past 24 hrs:  BP Temp Temp src Pulse Resp SpO2 Weight    09/17/11 0700 - - - - - - 58.6 kg (129 lb 3 oz)  09/17/11 0645 140/96 mmHg 98.4 F (36.9 C) Oral 86  18  99 % -  09/16/11 2037 145/98 mmHg 97.3 F (36.3 C) Oral 84  18  98 % -  09/16/11 1400 121/85 mmHg 98.5 F (36.9 C) Oral 83  20  95 % -    Intake/Output from previous day: 06/26 0701 - 06/27 0700 In: 3 [I.V.:3] Out: 400 [Urine:400] Total I/O In: 460 [P.O.:460] Out: 150 [Urine:150]   Physical Exam: Abdomen distended tympanitic, NT, BS hyperactive  Alert , oriented to self Cvs: rrr Rs: ctab     Lab Results: Basic Metabolic Panel:  Basename 09/17/11 0500 09/16/11 0536  NA 139 135  K 4.6 4.5  CL 111 108  CO2 18* 16*  GLUCOSE 89 88  BUN 60* 62*  CREATININE 1.70* 1.84*  CALCIUM 8.6 8.4  MG -- --  PHOS -- --   Liver Function Tests: No results found for this basename: AST:2,ALT:2,ALKPHOS:2,BILITOT:2,PROT:2,ALBUMIN:2 in the last 72 hours No results found for this basename: LIPASE:2,AMYLASE:2 in the last 72 hours No results found for this basename: AMMONIA:2 in the last 72 hours CBC:  Basename 09/17/11 0500 09/16/11 0536  WBC 10.9* 12.4*  NEUTROABS -- --  HGB 8.8* 9.5*  HCT 26.4* 28.2*  MCV 95.7 94.6  PLT 146* 162  Urine Drug Screen: Drugs of Abuse     Component Value Date/Time  LABOPIA NEGATIVE 03/27/2011 0300   LABOPIA NONE DETECTED 03/26/2011 2041   COCAINSCRNUR NEGATIVE 03/27/2011 0300   COCAINSCRNUR NONE DETECTED 03/26/2011 2041   LABBENZ NEGATIVE 03/27/2011 0300   LABBENZ NONE DETECTED 03/26/2011 2041   AMPHETMU NEGATIVE 03/27/2011 0300   AMPHETMU NONE DETECTED 03/26/2011 2041   THCU NONE DETECTED 03/26/2011 2041   LABBARB NONE DETECTED 03/26/2011 2041    Alcohol Level: No results found for this basename: ETH:2 in the last 72 hours Urinalysis: No results found for this basename: COLORURINE:2,APPERANCEUR:2,LABSPEC:2,PHURINE:2,GLUCOSEU:2,HGBUR:2,BILIRUBINUR:2,KETONESUR:2,PROTEINUR:2,UROBILINOGEN:2,NITRITE:2,LEUKOCYTESUR:2 in the last 72 hours  Recent Results (from  the past 240 hour(s))  URINE CULTURE     Status: Normal   Collection Time   09/12/11 12:05 AM      Component Value Range Status Comment   Specimen Description URINE, CLEAN CATCH   Final    Special Requests Normal   Final    Culture  Setup Time [id number]   Final    Colony Count 80,000 COLONIES/ML   Final    Culture     Final    Value: Multiple bacterial morphotypes present, none predominant. Suggest appropriate recollection if clinically indicated.   Report Status 09/13/2011 FINAL   Final   CLOSTRIDIUM DIFFICILE BY PCR     Status: Abnormal   Collection Time   09/12/11  6:10 AM      Component Value Range Status Comment   C difficile by pcr POSITIVE (*) NEGATIVE Final     Studies/Results: No results found.  Medications: Scheduled Meds:    . amLODipine  10 mg Oral Daily  . carvedilol  25 mg Oral BID WC  . clonazePAM  0.5 mg Oral BID  . divalproex  750 mg Oral Q12H  . enoxaparin  40 mg Subcutaneous Q24H  . losartan  25 mg Oral Daily  . metronidazole  500 mg Intravenous Q8H  . sodium bicarbonate  650 mg Oral BID  . sodium chloride  3 mL Intravenous Q12H  . vancomycin  125 mg Oral QID   Followed by  . vancomycin  125 mg Oral BID   Followed by  . vancomycin  125 mg Oral Daily   Followed by  . vancomycin  125 mg Oral QODAY   Followed by  . vancomycin  125 mg Oral Q3 days  . DISCONTD: cloNIDine  0.3 mg Transdermal Weekly  . DISCONTD: divalproex  750 mg Oral TID  . DISCONTD: feeding supplement  1 Container Oral TID BM  . DISCONTD: ferrous sulfate  325 mg Oral TID  . DISCONTD: polyethylene glycol  17 g Oral BID  . DISCONTD: sodium chloride  500 mL Intravenous Once   Continuous Infusions:   PRN Meds:.acetaminophen, acetaminophen, albuterol, feeding supplement (NEPRO CARB STEADY), ondansetron (ZOFRAN) IV, ondansetron, ondansetron, DISCONTD: guaiFENesin-dextromethorphan, DISCONTD: HYDROcodone-acetaminophen, DISCONTD:  morphine injection

## 2011-09-18 DIAGNOSIS — I10 Essential (primary) hypertension: Secondary | ICD-10-CM

## 2011-09-18 DIAGNOSIS — A0472 Enterocolitis due to Clostridium difficile, not specified as recurrent: Secondary | ICD-10-CM

## 2011-09-18 DIAGNOSIS — N179 Acute kidney failure, unspecified: Secondary | ICD-10-CM

## 2011-09-18 DIAGNOSIS — E860 Dehydration: Secondary | ICD-10-CM

## 2011-09-18 LAB — BASIC METABOLIC PANEL
BUN: 57 mg/dL — ABNORMAL HIGH (ref 6–23)
Chloride: 109 mEq/L (ref 96–112)
GFR calc Af Amer: 50 mL/min — ABNORMAL LOW (ref >90)
GFR calc non Af Amer: 43 mL/min — ABNORMAL LOW (ref >90)
Glucose, Bld: 82 mg/dL (ref 70–99)
Potassium: 4.6 mEq/L (ref 3.5–5.1)
Sodium: 137 mEq/L (ref 135–145)

## 2011-09-18 LAB — CBC
HCT: 26.6 % — ABNORMAL LOW (ref 36.0–46.0)
Hemoglobin: 8.9 g/dL — ABNORMAL LOW (ref 12.0–15.0)
MCHC: 33.5 g/dL (ref 30.0–36.0)
RBC: 2.76 MIL/uL — ABNORMAL LOW (ref 3.87–5.11)

## 2011-09-18 MED ORDER — SODIUM BICARBONATE 650 MG PO TABS
650.0000 mg | ORAL_TABLET | Freq: Three times a day (TID) | ORAL | Status: DC
  Administered 2011-09-18 – 2011-09-24 (×19): 650 mg via ORAL
  Filled 2011-09-18 (×21): qty 1

## 2011-09-18 MED ORDER — LOSARTAN POTASSIUM 50 MG PO TABS
50.0000 mg | ORAL_TABLET | Freq: Every day | ORAL | Status: DC
  Administered 2011-09-18 – 2011-09-23 (×6): 50 mg via ORAL
  Filled 2011-09-18 (×6): qty 1

## 2011-09-18 NOTE — Progress Notes (Signed)
Assessment/Plan: 1. Recurrent Cdiff colitis:diarrhea resolved, but now patient is developing abdominal distension  - I am concerned she may be having ileus and po meds are not reaching the target. Started iv flagyl on 09/16/11. Patient was on po Vancomycin since admission  DCed PPI. Changed antihypertensive from Clonidine which may be contributing to the ileus on 6/26  2. 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 Initially it was felt that the patient was volume depleted from diarrhea/vomiting, fluid losses.She did improve back to baseline, after iv fluids. D/w with Dr. Caryn Section on 09/16/11 and we substituted clonidine with losartan. Monitor BMET closely. So far so good.   3. 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 Hb stable compared to Hb at prior DC   4. Metabolic acidosis: - due to GI loses . Patient does not have acidosis at baseline from CKD. Started po bicarbonate 650 mg BID on 09/16/11 and increase to 650 mg po TID on 09/18/11. F/U Bmet closely.   5.  Anoxic brain injury - recovered some functionality after event 03/2011  6. H/o  Seizure disorder, continue depakote, but decreased dose on 09/16/11 to 750 BID due to sedation  7. H/o NICM-stable, continue coreg, lasix on hold, Last echo 1/13, EF of 35-40%. Started ARB on 09/16/11   8. HTN: see above   Full code  LOS:  7   Heather Sims [facility] Pager: [phone]        Subjective: Mother says that she thinks her daughter is improving  Objective: Vital signs in last 24 hours: Temp:  [97.6 F (36.4 C)-98.5 F (36.9 C)] 98.5 F (36.9 C) (06/28 0535) Pulse Rate:  [85-89] 85  (06/28 0535) Resp:  [18] 18  (06/28 0535) BP: (120-135)/(86-96) 135/96 mmHg (06/28 0535) SpO2:  [96 %-97 %] 96 % (06/28 0535) Weight:  [60 kg (132 lb 4.4 oz)] 60 kg (132 lb 4.4 oz) (06/28 0535) Weight change:  1.4 kg (3 lb 1.4 oz) Last BM Date: 08/27/11 Patient Vitals for the past 24 hrs:  BP Temp Temp src Pulse Resp SpO2 Weight  09/18/11 0535 135/96 mmHg 98.5 F (36.9 C) Oral 85  18  96 % 60 kg (132 lb 4.4 oz)  09/17/11 2113 122/90 mmHg 97.6 F (36.4 C) Axillary 88  18  96 % -  09/17/11 1358 120/86 mmHg 97.6 F (36.4 C) Oral 89  18  97 % -    Intake/Output from previous day: 06/27 0701 - 06/28 0700 In: 623 [P.O.:520; I.V.:3; IV Piggyback:100] Out: 150 [Urine:150]     Physical Exam: Abdomen distended tympanitic, NT, BS hyperactive  Alert , oriented to self Cvs: rrr Rs: ctab     Lab Results: Basic Metabolic Panel:  Basename 09/18/11 0553 09/17/11 0500  NA 137 139  K 4.6 4.6  CL 109 111  CO2 18* 18*  GLUCOSE 82 89  BUN 57* 60*  CREATININE 1.65* 1.70*  CALCIUM 8.7 8.6  MG -- --  PHOS -- --   Liver Function Tests: No results found for this basename: AST:2,ALT:2,ALKPHOS:2,BILITOT:2,PROT:2,ALBUMIN:2 in the last 72 hours No results found for this basename: LIPASE:2,AMYLASE:2 in the last 72 hours No results found for this basename: AMMONIA:2 in the last 72 hours CBC:  Basename 09/18/11 0553 09/17/11 0500  WBC 10.7* 10.9*  NEUTROABS -- --  HGB 8.9* 8.8*  HCT  26.6* 26.4*  MCV 96.4 95.7  PLT 151 146*  Urine Drug Screen: Drugs of Abuse     Component Value Date/Time   LABOPIA NEGATIVE 03/27/2011 0300   LABOPIA NONE DETECTED 03/26/2011 2041   COCAINSCRNUR NEGATIVE 03/27/2011 0300   COCAINSCRNUR NONE DETECTED 03/26/2011 2041   LABBENZ NEGATIVE 03/27/2011 0300   LABBENZ NONE DETECTED 03/26/2011 2041   AMPHETMU NEGATIVE 03/27/2011 0300   AMPHETMU NONE DETECTED 03/26/2011 2041   THCU NONE DETECTED 03/26/2011 2041   LABBARB NONE DETECTED 03/26/2011 2041    Alcohol Level: No results found for this basename: ETH:2 in the last 72 hours Urinalysis: No results found for this basename:  COLORURINE:2,APPERANCEUR:2,LABSPEC:2,PHURINE:2,GLUCOSEU:2,HGBUR:2,BILIRUBINUR:2,KETONESUR:2,PROTEINUR:2,UROBILINOGEN:2,NITRITE:2,LEUKOCYTESUR:2 in the last 72 hours  Recent Results (from the past 240 hour(s))  URINE CULTURE     Status: Normal   Collection Time   09/12/11 12:05 AM      Component Value Range Status Comment   Specimen Description URINE, CLEAN CATCH   Final    Special Requests Normal   Final    Culture  Setup Time [id number]   Final    Colony Count 80,000 COLONIES/ML   Final    Culture     Final    Value: Multiple bacterial morphotypes present, none predominant. Suggest appropriate recollection if clinically indicated.   Report Status 09/13/2011 FINAL   Final   CLOSTRIDIUM DIFFICILE BY PCR     Status: Abnormal   Collection Time   09/12/11  6:10 AM      Component Value Range Status Comment   C difficile by pcr POSITIVE (*) NEGATIVE Final     Studies/Results: No results found.  Medications: Scheduled Meds:    . amLODipine  10 mg Oral Daily  . carvedilol  25 mg Oral BID WC  . clonazePAM  0.5 mg Oral BID  . divalproex  750 mg Oral Q12H  . enoxaparin  40 mg Subcutaneous Q24H  . losartan  50 mg Oral Daily  . metronidazole  500 mg Intravenous Q8H  . sodium bicarbonate  650 mg Oral TID  . sodium chloride  3 mL Intravenous Q12H  . vancomycin  125 mg Oral QID   Followed by  . vancomycin  125 mg Oral BID   Followed by  . vancomycin  125 mg Oral Daily   Followed by  . vancomycin  125 mg Oral QODAY   Followed by  . vancomycin  125 mg Oral Q3 days  . DISCONTD: losartan  25 mg Oral Daily  . DISCONTD: sodium bicarbonate  650 mg Oral BID   Continuous Infusions:   PRN Meds:.acetaminophen, acetaminophen, albuterol, feeding supplement (NEPRO CARB STEADY), ondansetron (ZOFRAN) IV, ondansetron, ondansetron

## 2011-09-18 NOTE — Progress Notes (Signed)
Cosign for Alan Tripp RN on assessment and meds.  

## 2011-09-19 ENCOUNTER — Inpatient Hospital Stay (HOSPITAL_COMMUNITY): Payer: Medicaid Other

## 2011-09-19 DIAGNOSIS — E860 Dehydration: Secondary | ICD-10-CM

## 2011-09-19 DIAGNOSIS — A0472 Enterocolitis due to Clostridium difficile, not specified as recurrent: Secondary | ICD-10-CM

## 2011-09-19 DIAGNOSIS — I10 Essential (primary) hypertension: Secondary | ICD-10-CM

## 2011-09-19 DIAGNOSIS — N179 Acute kidney failure, unspecified: Secondary | ICD-10-CM

## 2011-09-19 LAB — CBC
HCT: 28.1 % — ABNORMAL LOW (ref 36.0–46.0)
Hemoglobin: 9.3 g/dL — ABNORMAL LOW (ref 12.0–15.0)
MCH: 31.8 pg (ref 26.0–34.0)
MCHC: 33.1 g/dL (ref 30.0–36.0)
MCV: 96.2 fL (ref 78.0–100.0)
RBC: 2.92 MIL/uL — ABNORMAL LOW (ref 3.87–5.11)

## 2011-09-19 LAB — BASIC METABOLIC PANEL
BUN: 52 mg/dL — ABNORMAL HIGH (ref 6–23)
CO2: 18 mEq/L — ABNORMAL LOW (ref 19–32)
GFR calc non Af Amer: 47 mL/min — ABNORMAL LOW (ref >90)
Glucose, Bld: 85 mg/dL (ref 70–99)
Potassium: 4.8 mEq/L (ref 3.5–5.1)
Sodium: 138 mEq/L (ref 135–145)

## 2011-09-19 MED ORDER — HYDRALAZINE HCL 20 MG/ML IJ SOLN
10.0000 mg | INTRAMUSCULAR | Status: DC | PRN
  Administered 2011-09-19 – 2011-09-23 (×4): 10 mg via INTRAVENOUS
  Filled 2011-09-19 (×5): qty 0.5

## 2011-09-19 NOTE — Progress Notes (Signed)
Assessment/Plan: 1. Recurrent Cdiff colitis:diarrhea resolved, but now patient is developing abdominal distension  - patient's belly still distended today even though it is nontender. Ordered KUB. 2. 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 Initially it was felt that the patient was volume depleted from diarrhea/vomiting, fluid losses.She did improve back to baseline, after iv fluids. D/w with Dr. Caryn Section on 09/16/11 and we substituted clonidine with losartan. Monitor BMET closely. Creatinine is improving.   3. 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 Hb stable compared to Hb at prior DC   4. Metabolic acidosis: - due to GI loses . Patient does not have acidosis at baseline from CKD. Started po bicarbonate 650 mg BID on 09/16/11 and increase to 650 mg po TID on 09/18/11. Bicarbonate is stable   5.  Anoxic brain injury - recovered some functionality after event 03/2011  6. H/o  Seizure disorder, continue depakote, but decreased dose on 09/16/11 to 750 BID due to sedation  7. H/o NICM-stable, continue coreg, lasix on hold, Last echo 1/13, EF of 35-40%. Started ARB on 09/16/11   8. HTN: Add when necessary hydralazine  Full code  LOS:  8   Molli Posey MD [facility] Pager: [phone]   Subjective: Patient is without any complaints. Discussed her progress with her mother.  Objective: Vital signs in last 24 hours: Temp:  [99.2 F (37.3 C)] 99.2 F (37.3 C) (06/29 1443) Pulse Rate:  [83-98] 83  (06/29 1443) Resp:  [18] 18  (06/29 1443) BP: (135-140)/(95-104) 140/104 mmHg (06/29 1443) SpO2:  [98 %-99 %] 98 % (06/29 1443) Weight change:  Last BM Date: 09/18/11 Patient Vitals for the past 24 hrs:  BP Temp Temp src Pulse Resp SpO2  09/19/11 1443 140/104 mmHg 99.2 F (37.3 C) Oral 83  18  98 %  09/18/11 2329 135/95 mmHg - Oral 98  18  99 %     Intake/Output from previous day: 06/28 0701 - 06/29 0700 In: 1143 [P.O.:940; I.V.:3; IV Piggyback:200] Out: 300 [Urine:300] Total I/O In: 820 [P.O.:820] Out: -    Physical Exam: Abdomen distended tympanitic, NT, BS  , no guarding no rebound Alert , oriented to self Cvs: rrr Rs: ctab     Lab Results: Basic Metabolic Panel:  Basename 09/19/11 0700 09/18/11 0553  NA 138 137  K 4.8 4.6  CL 110 109  CO2 18* 18*  GLUCOSE 85 82  BUN 52* 57*  CREATININE 1.53* 1.65*  CALCIUM 8.5 8.7  MG -- --  PHOS -- --   CBC:  Basename 09/19/11 0700 09/18/11 0553  WBC 10.3 10.7*  NEUTROABS -- --  HGB 9.3* 8.9*  HCT 28.1* 26.6*  MCV 96.2 96.4  PLT 142* 151  Urine Drug Screen: Drugs of Abuse     Component Value Date/Time   LABOPIA NEGATIVE 03/27/2011 0300   LABOPIA NONE DETECTED 03/26/2011 2041   COCAINSCRNUR NEGATIVE 03/27/2011 0300   COCAINSCRNUR NONE DETECTED 03/26/2011 2041   LABBENZ NEGATIVE 03/27/2011 0300   LABBENZ NONE DETECTED 03/26/2011 2041   AMPHETMU NEGATIVE 03/27/2011 0300   AMPHETMU NONE DETECTED 03/26/2011 2041   THCU NONE DETECTED 03/26/2011 2041   LABBARB NONE DETECTED 03/26/2011 2041      Recent Results (from the past 240 hour(s))  URINE CULTURE     Status: Normal   Collection Time  09/12/11 12:05 AM      Component Value Range Status Comment   Specimen Description URINE, CLEAN CATCH   Final    Special Requests Normal   Final    Culture  Setup Time [id number]   Final    Colony Count 80,000 COLONIES/ML   Final    Culture     Final    Value: Multiple bacterial morphotypes present, none predominant. Suggest appropriate recollection if clinically indicated.   Report Status 09/13/2011 FINAL   Final   CLOSTRIDIUM DIFFICILE BY PCR     Status: Abnormal   Collection Time   09/12/11  6:10 AM      Component Value Range Status Comment   C difficile by pcr POSITIVE (*) NEGATIVE Final     Medications: Scheduled Meds:    . amLODipine  10 mg Oral Daily  . carvedilol  25  mg Oral BID WC  . clonazePAM  0.5 mg Oral BID  . divalproex  750 mg Oral Q12H  . enoxaparin  40 mg Subcutaneous Q24H  . losartan  50 mg Oral Daily  . metronidazole  500 mg Intravenous Q8H  . sodium bicarbonate  650 mg Oral TID  . sodium chloride  3 mL Intravenous Q12H  . vancomycin  125 mg Oral QID   Followed by  . vancomycin  125 mg Oral BID   Followed by  . vancomycin  125 mg Oral Daily   Followed by  . vancomycin  125 mg Oral QODAY   Followed by  . vancomycin  125 mg Oral Q3 days   Continuous Infusions:   PRN Meds:.acetaminophen, acetaminophen, albuterol, feeding supplement (NEPRO CARB STEADY), ondansetron (ZOFRAN) IV, ondansetron, ondansetron

## 2011-09-20 DIAGNOSIS — I10 Essential (primary) hypertension: Secondary | ICD-10-CM

## 2011-09-20 DIAGNOSIS — N179 Acute kidney failure, unspecified: Secondary | ICD-10-CM

## 2011-09-20 DIAGNOSIS — A0472 Enterocolitis due to Clostridium difficile, not specified as recurrent: Secondary | ICD-10-CM

## 2011-09-20 DIAGNOSIS — E860 Dehydration: Secondary | ICD-10-CM

## 2011-09-20 LAB — CBC
MCH: 32.1 pg (ref 26.0–34.0)
MCHC: 33.2 g/dL (ref 30.0–36.0)
MCV: 96.7 fL (ref 78.0–100.0)
Platelets: 152 10*3/uL (ref 150–400)
RBC: 2.4 MIL/uL — ABNORMAL LOW (ref 3.87–5.11)
RDW: 17 % — ABNORMAL HIGH (ref 11.5–15.5)

## 2011-09-20 LAB — BASIC METABOLIC PANEL
BUN: 49 mg/dL — ABNORMAL HIGH (ref 6–23)
CO2: 18 mEq/L — ABNORMAL LOW (ref 19–32)
Calcium: 8.5 mg/dL (ref 8.4–10.5)
Creatinine, Ser: 1.43 mg/dL — ABNORMAL HIGH (ref 0.50–1.10)
GFR calc non Af Amer: 51 mL/min — ABNORMAL LOW (ref >90)
Glucose, Bld: 83 mg/dL (ref 70–99)

## 2011-09-20 LAB — HEMOGLOBIN AND HEMATOCRIT, BLOOD: Hemoglobin: 8.7 g/dL — ABNORMAL LOW (ref 12.0–15.0)

## 2011-09-20 MED ORDER — FUROSEMIDE 40 MG PO TABS
40.0000 mg | ORAL_TABLET | Freq: Two times a day (BID) | ORAL | Status: DC
  Filled 2011-09-20 (×3): qty 1

## 2011-09-20 MED ORDER — FUROSEMIDE 40 MG PO TABS
40.0000 mg | ORAL_TABLET | Freq: Every day | ORAL | Status: DC
  Administered 2011-09-20: 40 mg via ORAL
  Filled 2011-09-20: qty 1

## 2011-09-20 NOTE — Progress Notes (Signed)
Pt's hgb=7.7 today from 9.3 yesterday. Dr. Earlene Plater was notified, awaiting orders at this time, reported to Rashida RN day nurse.

## 2011-09-20 NOTE — Progress Notes (Signed)
Assessment/Plan: 1. Recurrent Cdiff colitis:diarrhea resolved, but now patient is developing abdominal distension  - patient's belly still distended today even though it is nontender. Ordered KUB. 2. 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 Initially it was felt that the patient was volume depleted from diarrhea/vomiting, fluid losses.She did improve back to baseline, after iv fluids. D/w with Dr. Caryn Section on 09/16/11 and we substituted clonidine with losartan. Monitor BMET closely. Creatinine is improving.   3. 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 Hemoglobin came back at 7.7 but repeat was over 8.   4. Metabolic acidosis: - due to GI loses . Patient does not have acidosis at baseline from CKD. Started po bicarbonate 650 mg BID on 09/16/11 and increase to 650 mg po TID on 09/18/11. Bicarbonate is stable   5.  Anoxic brain injury - recovered some functionality after event 03/2011  6. H/o  Seizure disorder, continue depakote, but decreased dose on 09/16/11 to 750 BID due to sedation  7. H/o NICM-stable, continue coreg, restart lasixld, Last echo 1/13, EF of 35-40%. Started ARB on 09/16/11,    8. HTN: Add when necessary hydralazine  9. Ascites-patient had a psyche seen on the x-ray of the abdomen. Will resume her Lasix. Per mother patient wasn't on 9 of Lasix twice daily at home.  Full code  LOS:  9   Molli Posey MD [facility] Pager: [phone]   Subjective: Patient is without any complaints. Discussed her progress with her mother who is at the bedside today.  Objective: Vital signs in last 24 hours: Temp:  [99.2 F (37.3 C)] 99.2 F (37.3 C) (06/29 1443) Pulse Rate:  [83-98] 83  (06/29 1443) Resp:  [18] 18  (06/29 1443) BP: (135-140)/(95-104) 140/104 mmHg (06/29 1443) SpO2:  [98 %-99 %] 98 % (06/29 1443) Weight change:  Last BM  Date: 09/20/11 Patient Vitals for the past 24 hrs:  BP Temp Temp src Pulse Resp SpO2  09/19/11 1443 140/104 mmHg 99.2 F (37.3 C) Oral 83  18  98 %  09/18/11 2329 135/95 mmHg - Oral 98  18  99 %    Intake/Output from previous day: 06/28 0701 - 06/29 0700 In: 1143 [P.O.:940; I.V.:3; IV Piggyback:200] Out: 300 [Urine:300] Total I/O In: 440 [P.O.:440] Out: 3 [Urine:2; Stool:1]   Physical Exam: Abdomen distended tympanitic, NT, BS  , no guarding no rebound Alert , oriented to self Cvs: rrr Rs: ctab     Lab Results: Basic Metabolic Panel:  Basename 09/19/11 0700 09/18/11 0553  NA 138 137  K 4.8 4.6  CL 110 109  CO2 18* 18*  GLUCOSE 85 82  BUN 52* 57*  CREATININE 1.53* 1.65*  CALCIUM 8.5 8.7  MG -- --  PHOS -- --   CBC:  Basename 09/19/11 0700 09/18/11 0553  WBC 10.3 10.7*  NEUTROABS -- --  HGB 9.3* 8.9*  HCT 28.1* 26.6*  MCV 96.2 96.4  PLT 142* 151  Urine Drug Screen: Drugs of Abuse     Component Value Date/Time   LABOPIA NEGATIVE 03/27/2011 0300   LABOPIA NONE DETECTED 03/26/2011 2041   COCAINSCRNUR NEGATIVE 03/27/2011 0300   COCAINSCRNUR NONE DETECTED 03/26/2011 2041   LABBENZ NEGATIVE 03/27/2011 0300   LABBENZ NONE DETECTED 03/26/2011 2041   AMPHETMU NEGATIVE 03/27/2011 0300   AMPHETMU NONE DETECTED 03/26/2011 2041  THCU NONE DETECTED 03/26/2011 2041   LABBARB NONE DETECTED 03/26/2011 2041      Recent Results (from the past 240 hour(s))  URINE CULTURE     Status: Normal   Collection Time   09/12/11 12:05 AM      Component Value Range Status Comment   Specimen Description URINE, CLEAN CATCH   Final    Special Requests Normal   Final    Culture  Setup Time [id number]   Final    Colony Count 80,000 COLONIES/ML   Final    Culture     Final    Value: Multiple bacterial morphotypes present, none predominant. Suggest appropriate recollection if clinically indicated.   Report Status 09/13/2011 FINAL   Final   CLOSTRIDIUM DIFFICILE BY PCR     Status: Abnormal    Collection Time   09/12/11  6:10 AM      Component Value Range Status Comment   C difficile by pcr POSITIVE (*) NEGATIVE Final     Medications: Scheduled Meds:    . amLODipine  10 mg Oral Daily  . carvedilol  25 mg Oral BID WC  . clonazePAM  0.5 mg Oral BID  . divalproex  750 mg Oral Q12H  . enoxaparin  40 mg Subcutaneous Q24H  . losartan  50 mg Oral Daily  . metronidazole  500 mg Intravenous Q8H  . sodium bicarbonate  650 mg Oral TID  . sodium chloride  3 mL Intravenous Q12H  . vancomycin  125 mg Oral QID   Followed by  . vancomycin  125 mg Oral BID   Followed by  . vancomycin  125 mg Oral Daily   Followed by  . vancomycin  125 mg Oral QODAY   Followed by  . vancomycin  125 mg Oral Q3 days   Continuous Infusions:   PRN Meds:.acetaminophen, acetaminophen, albuterol, feeding supplement (NEPRO CARB STEADY), ondansetron (ZOFRAN) IV, ondansetron, ondansetron

## 2011-09-21 DIAGNOSIS — N189 Chronic kidney disease, unspecified: Secondary | ICD-10-CM

## 2011-09-21 LAB — CBC
HCT: 24.9 % — ABNORMAL LOW (ref 36.0–46.0)
Hemoglobin: 8.3 g/dL — ABNORMAL LOW (ref 12.0–15.0)
RBC: 2.55 MIL/uL — ABNORMAL LOW (ref 3.87–5.11)

## 2011-09-21 LAB — BASIC METABOLIC PANEL
Chloride: 109 mEq/L (ref 96–112)
GFR calc Af Amer: 60 mL/min — ABNORMAL LOW (ref >90)
GFR calc non Af Amer: 51 mL/min — ABNORMAL LOW (ref >90)
Potassium: 4.9 mEq/L (ref 3.5–5.1)
Sodium: 137 mEq/L (ref 135–145)

## 2011-09-21 MED ORDER — FUROSEMIDE 80 MG PO TABS: 80.0000 mg | ORAL_TABLET | Freq: Every day | ORAL | Status: DC

## 2011-09-21 MED ORDER — FUROSEMIDE 80 MG PO TABS
80.0000 mg | ORAL_TABLET | Freq: Every day | ORAL | Status: DC
  Administered 2011-09-21 – 2011-09-23 (×3): 80 mg via ORAL
  Filled 2011-09-21 (×4): qty 1

## 2011-09-21 NOTE — Progress Notes (Signed)
Assessment/Plan: 1. Recurrent Cdiff colitis:diarrhea resolved, stools are more formed per discussion with patient's mother.  - patient's belly still distended today even though it is nontender. X-ray showed some a ascites. Her Lasix was restarted. Will monitor ins and outs. Will recheck again tomorrow. 2. 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 Initially it was felt that the patient was volume depleted from diarrhea/vomiting, fluid losses.She did improve back to baseline, after iv fluids. D/w with Dr. Caryn Section on 09/16/11 and we substituted clonidine with losartan. Monitor BMET closely. Creatinine is improving.   3. 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 Hemoglobin came back at 7.7 but repeat was over 8. Hemoglobin is stable  4. Metabolic acidosis: - due to GI loses . Patient does not have acidosis at baseline from CKD. Started po bicarbonate 650 mg BID on 09/16/11 and increase to 650 mg po TID on 09/18/11. Bicarbonate is stable   5.  Anoxic brain injury - recovered some functionality after event 03/2011  6. H/o  Seizure disorder, continue depakote, but decreased dose on 09/16/11 to 750 BID due to sedation  7. H/o NICM-stable, continue coreg, restart lasix on 6/30, Last echo 1/13, EF of 35-40%. Started ARB on 09/16/11,    8. HTN: when necessary hydralazine  9. Ascites-patient had a ascites seen on the x-ray of the abdomen. Restarted her Lasix yesterday. Per mother patient was on 42 of Lasix twice daily at home.  Full code Disposition: In 1 to 2 days.  LOS:  10   Molli Posey MD [facility] Pager: [phone]   Subjective: Patient is without any complaints. Discussed her progress with her mother who is at the bedside today.  Objective: Vital signs in last 24 hours: Temp:  [99.2 F (37.3 C)] 99.2 F (37.3 C) (06/29 1443) Pulse Rate:  [83-98]  83  (06/29 1443) Resp:  [18] 18  (06/29 1443) BP: (135-140)/(95-104) 140/104 mmHg (06/29 1443) SpO2:  [98 %-99 %] 98 % (06/29 1443) Weight change:  Last BM Date: 09/20/11 Patient Vitals for the past 24 hrs:  BP Temp Temp src Pulse Resp SpO2  09/19/11 1443 140/104 mmHg 99.2 F (37.3 C) Oral 83  18  98 %  09/18/11 2329 135/95 mmHg - Oral 98  18  99 %    Intake/Output from previous day: 06/28 0701 - 06/29 0700 In: 1143 [P.O.:940; I.V.:3; IV Piggyback:200] Out: 300 [Urine:300] Total I/O In: 460 [P.O.:460] Out: -    Physical Exam: Abdomen distended tympanitic, NT, BS  , no guarding no rebound Alert , oriented to self Cvs: rrr Rs: ctab  EXT: no edema    Lab Results: Basic Metabolic Panel:  Basename 09/19/11 0700 09/18/11 0553  NA 138 137  K 4.8 4.6  CL 110 109  CO2 18* 18*  GLUCOSE 85 82  BUN 52* 57*  CREATININE 1.53* 1.65*  CALCIUM 8.5 8.7  MG -- --  PHOS -- --   CBC:  Basename 09/19/11 0700 09/18/11 0553  WBC 10.3 10.7*  NEUTROABS -- --  HGB 9.3* 8.9*  HCT 28.1* 26.6*  MCV 96.2 96.4  PLT 142* 151  Urine Drug Screen: Drugs of Abuse     Component Value Date/Time   LABOPIA NEGATIVE 03/27/2011 0300   LABOPIA NONE DETECTED 03/26/2011 2041   COCAINSCRNUR NEGATIVE 03/27/2011 0300   COCAINSCRNUR NONE DETECTED  03/26/2011 2041   LABBENZ NEGATIVE 03/27/2011 0300   LABBENZ NONE DETECTED 03/26/2011 2041   AMPHETMU NEGATIVE 03/27/2011 0300   AMPHETMU NONE DETECTED 03/26/2011 2041   THCU NONE DETECTED 03/26/2011 2041   LABBARB NONE DETECTED 03/26/2011 2041      Recent Results (from the past 240 hour(s))  URINE CULTURE     Status: Normal   Collection Time   09/12/11 12:05 AM      Component Value Range Status Comment   Specimen Description URINE, CLEAN CATCH   Final    Special Requests Normal   Final    Culture  Setup Time [id number]   Final    Colony Count 80,000 COLONIES/ML   Final    Culture     Final    Value: Multiple bacterial morphotypes present, none predominant.  Suggest appropriate recollection if clinically indicated.   Report Status 09/13/2011 FINAL   Final   CLOSTRIDIUM DIFFICILE BY PCR     Status: Abnormal   Collection Time   09/12/11  6:10 AM      Component Value Range Status Comment   C difficile by pcr POSITIVE (*) NEGATIVE Final     Medications: Scheduled Meds:    . amLODipine  10 mg Oral Daily  . carvedilol  25 mg Oral BID WC  . clonazePAM  0.5 mg Oral BID  . divalproex  750 mg Oral Q12H  . enoxaparin  40 mg Subcutaneous Q24H  . losartan  50 mg Oral Daily  . metronidazole  500 mg Intravenous Q8H  . sodium bicarbonate  650 mg Oral TID  . sodium chloride  3 mL Intravenous Q12H  . vancomycin  125 mg Oral QID   Followed by  . vancomycin  125 mg Oral BID   Followed by  . vancomycin  125 mg Oral Daily   Followed by  . vancomycin  125 mg Oral QODAY   Followed by  . vancomycin  125 mg Oral Q3 days   Continuous Infusions:   PRN Meds:.acetaminophen, acetaminophen, albuterol, feeding supplement (NEPRO CARB STEADY), ondansetron (ZOFRAN) IV, ondansetron, ondansetron

## 2011-09-22 DIAGNOSIS — A0472 Enterocolitis due to Clostridium difficile, not specified as recurrent: Principal | ICD-10-CM

## 2011-09-22 DIAGNOSIS — N179 Acute kidney failure, unspecified: Secondary | ICD-10-CM

## 2011-09-22 DIAGNOSIS — I428 Other cardiomyopathies: Secondary | ICD-10-CM

## 2011-09-22 DIAGNOSIS — R1110 Vomiting, unspecified: Secondary | ICD-10-CM

## 2011-09-22 DIAGNOSIS — D649 Anemia, unspecified: Secondary | ICD-10-CM

## 2011-09-22 LAB — BASIC METABOLIC PANEL
CO2: 20 mEq/L (ref 19–32)
Chloride: 111 mEq/L (ref 96–112)
Glucose, Bld: 86 mg/dL (ref 70–99)
Potassium: 5.3 mEq/L — ABNORMAL HIGH (ref 3.5–5.1)
Sodium: 142 mEq/L (ref 135–145)

## 2011-09-22 LAB — CBC
HCT: 24.2 % — ABNORMAL LOW (ref 36.0–46.0)
Hemoglobin: 7.9 g/dL — ABNORMAL LOW (ref 12.0–15.0)
MCH: 31.6 pg (ref 26.0–34.0)
MCV: 96.8 fL (ref 78.0–100.0)
RBC: 2.5 MIL/uL — ABNORMAL LOW (ref 3.87–5.11)

## 2011-09-22 LAB — POTASSIUM: Potassium: 4.9 mEq/L (ref 3.5–5.1)

## 2011-09-22 NOTE — Progress Notes (Signed)
Sent text page to MD regarding pt's Hgb and potassium level today. No orders given as of right now. Will continue to monitor pt closely and follow MD orders.  Juliane Lack, RN
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
## 2011-09-22 NOTE — Progress Notes (Signed)
Nutrition Follow-up  Intervention:   No new nutrition interventions  Continue Nepro PRN RD will continue to follow  Assessment:   Pt with intake. Continues to drink dark sodas despite education. Pt with Nepro cans in room, untouched. Pt denies any needs at this time. Again, RD encouraged pt to avoid dark sodas.   Diet Order:  Regular  Meds: Scheduled Meds:   . amLODipine  10 mg Oral Daily  . carvedilol  25 mg Oral BID WC  . clonazePAM  0.5 mg Oral BID  . divalproex  750 mg Oral Q12H  . enoxaparin  40 mg Subcutaneous Q24H  . furosemide  80 mg Oral Daily  . losartan  50 mg Oral Daily  . sodium bicarbonate  650 mg Oral TID  . sodium chloride  3 mL Intravenous Q12H  . vancomycin  125 mg Oral BID   Followed by  . vancomycin  125 mg Oral Daily   Followed by  . vancomycin  125 mg Oral QODAY   Followed by  . vancomycin  125 mg Oral Q3 days   Continuous Infusions:  PRN Meds:.acetaminophen, acetaminophen, albuterol, feeding supplement (NEPRO CARB STEADY), hydrALAZINE, ondansetron (ZOFRAN) IV, ondansetron, ondansetron  Labs:  CMP     Component Value Date/Time   NA 142 09/22/2011 0625   K 5.3* 09/22/2011 0625   CL 111 09/22/2011 0625   CO2 20 09/22/2011 0625   GLUCOSE 86 09/22/2011 0625   BUN 44* 09/22/2011 0625   CREATININE 1.42* 09/22/2011 0625   CALCIUM 8.6 09/22/2011 0625   PROT 5.1* 09/12/2011 0728   ALBUMIN 1.8* 09/12/2011 0728   AST 18 09/12/2011 0728   ALT <3 09/12/2011 0728   ALKPHOS 39 09/12/2011 0728   BILITOT 0.1* 09/12/2011 0728   GFRNONAA 52* 09/22/2011 0625   GFRAA 60* 09/22/2011 0625     Intake/Output Summary (Last 24 hours) at 09/22/11 1406 Last data filed at 09/22/11 0700  Gross per 24 hour  Intake    520 ml  Output    401 ml  Net    119 ml    Weight Status:  138 lbs, weight continues to be variable   Re-estimated needs:  1800-2000 kcal, 60-70 gm protein  Nutrition Dx:  Malnutrition, ongoing   Goal:  Pt to meet >/= 90% of estimated nutrition needs  Monitor:  PO  intake, education needs   Clarene Duke RD, LDN Pager [phone] After Hours pager [phone]

## 2011-09-23 DIAGNOSIS — G931 Anoxic brain damage, not elsewhere classified: Secondary | ICD-10-CM

## 2011-09-23 DIAGNOSIS — I10 Essential (primary) hypertension: Secondary | ICD-10-CM

## 2011-09-23 LAB — BASIC METABOLIC PANEL
BUN: 41 mg/dL — ABNORMAL HIGH (ref 6–23)
CO2: 22 mEq/L (ref 19–32)
Chloride: 110 mEq/L (ref 96–112)
Creatinine, Ser: 1.43 mg/dL — ABNORMAL HIGH (ref 0.50–1.10)
Glucose, Bld: 91 mg/dL (ref 70–99)

## 2011-09-23 LAB — CBC
HCT: 26.6 % — ABNORMAL LOW (ref 36.0–46.0)
MCV: 97.4 fL (ref 78.0–100.0)
RDW: 16.6 % — ABNORMAL HIGH (ref 11.5–15.5)
WBC: 13.1 10*3/uL — ABNORMAL HIGH (ref 4.0–10.5)

## 2011-09-23 MED ORDER — LOSARTAN POTASSIUM 50 MG PO TABS
100.0000 mg | ORAL_TABLET | Freq: Every day | ORAL | Status: DC
  Administered 2011-09-24: 100 mg via ORAL
  Filled 2011-09-23: qty 2

## 2011-09-23 MED ORDER — SACCHAROMYCES BOULARDII 250 MG PO CAPS
250.0000 mg | ORAL_CAPSULE | Freq: Two times a day (BID) | ORAL | Status: DC
  Administered 2011-09-23 – 2011-09-24 (×3): 250 mg via ORAL
  Filled 2011-09-23 (×4): qty 1

## 2011-09-23 NOTE — Progress Notes (Addendum)
Patient ID: Heather Sims  female  WUJ:[id number]    DOB: 12-15-1990    DOA: 09/11/2011  PCP: Jaclyn Shaggy, MD  Subjective: Overnight events noted, BP extremely high, patient ? Unresponsive vs seizure, per mother at bed side, was like seizure episode. Depakote was decreased to 750mg BID (<- 750mg  TID) on 09/16/11 due to sedation. Per Neurology, depakote was increased to 750mg  TID on 08/27/11.  Objective: Weight change: 0.694 kg (1 lb 8.5 oz)  Intake/Output Summary (Last 24 hours) at 09/23/11 1302 Last data filed at 09/23/11 0824  Gross per 24 hour  Intake    843 ml  Output    301 ml  Net    542 ml   Blood pressure 158/108, pulse 101, temperature 98 F (36.7 C), temperature source Oral, resp. rate 22, height 5\' 2"  (1.575 m), weight 63.594 kg (140 lb 3.2 oz), last menstrual period 05/24/2011, SpO2 100.00%.  Physical Exam: General: Alert and awake, oriented x3, not in any acute distress. HEENT: anicteric sclera, pupils reactive to light and accommodation, EOMI CVS: S1-S2 clear, no murmur rubs or gallops Chest: clear to auscultation bilaterally, no wheezing, rales or rhonchi Abdomen: soft nontender, nondistended, normal bowel sounds, no organomegaly Extremities: no cyanosis, clubbing or edema noted bilaterally Neuro: Cranial nerves II-XII intact, no focal neurological deficits  Lab Results: Basic Metabolic Panel:  Lab 09/23/11 9562 09/22/11 1351 09/22/11 0625  NA 139 -- 142  K 5.1 4.9 --  CL 110 -- 111  CO2 22 -- 20  GLUCOSE 91 -- 86  BUN 41* -- 44*  CREATININE 1.43* -- 1.42*  CALCIUM 8.8 -- 8.6  MG -- -- --  PHOS -- -- --   CBC:  Lab 09/23/11 0620 09/22/11 0625  WBC 13.1* 11.5*  NEUTROABS -- --  HGB 8.9* 7.9*  HCT 26.6* 24.2*  MCV 97.4 96.8  PLT 172 156    Studies/Results: Ct Abdomen Pelvis Wo Contrast  08/24/2011  *RADIOLOGY REPORT*  Clinical Data: Abdominal distention and weakness.  History of renal disease.  CT ABDOMEN AND PELVIS WITHOUT CONTRAST  Technique:   Multidetector CT imaging of the abdomen and pelvis was performed following the standard protocol without intravenous contrast.  Comparison: Report from renal ultrasound dated 03/29/2011. It is noted that the patient's prior imaging studies are not currently merged with this CT.  The CT technologist has been notified and will leave a message for IT to merge this study with prior imaging studies.  Findings: At the lung bases, the heart size appears mildly enlarged, but is not completely included in the imaging field. A small pericardial effusion is seen anteriorly.  There is a small left pleural effusion. Left basilar atelectasis is present.  There is a moderate degree of abdominal ascites, and a large volume of pelvic ascites.  There are no prior studies of the abdomen pelvis for comparison.  The noncontrast appearance of the liver, gallbladder, spleen, adrenal glands, pancreas, and kidneys is within normal limits. Both kidneys are normal in size and parenchymal thickness.  No urinary tract stones are identified.  No evidence of ureteral dilatation; the ureters are difficult to follow in their entirety due to the ascites.  The patient was given oral contrast, which has progressed to the level of the cecum at the time of the exam. In the right aspect of the pelvis, there is circumferentially thickened of the distal ileum, with a somewhat nodular and thickened wall.  Wall thickness measures up to 10 mm. No definite thickening of the  terminal ileum. The appendix is seen on images 52-55 and is normal.  Proximal small bowel loops appear normal in wall thickness.  No evidence of bowel obstruction.  Moderate amount of stool in the sigmoid colon and rectum.  Urinary bladder, uterus, and ovaries within normal limits for noncontrast CT.  No definite pathologic lymphadenopathy, although evaluation is limited without intravenous contrast, and due to obscuration of the fat planes about the ascites.  No acute or suspicious bony  abnormality.  Sacroiliac joints within normal limits.  The  IMPRESSION: 1.  Moderate to large volume of abdominal and pelvic ascites.   2.  Markedly abnormal circumferential wall thickening of the distal ileum without definite involvement of the terminal ileum. Findings could be due to inflammatory bowel disease or infectious ileitis.  Small bowel lymphoma or small bowel hemorrhage would be considered less likely. 3.  Small left pleural effusion. 4.  Small pericardial effusion. 5.  Suspect cardiomegaly, although heart is not completely included on the image.  Original Report Authenticated By: Britta Mccreedy, M.D.   Dg Abd 1 View  09/19/2011  *RADIOLOGY REPORT*  Clinical Data:  Abdominal distention  ABDOMEN - 1 VIEW  Comparison: 09/14/2011  Findings: Slightly prominent stool throughout colon. No definite bowel dilatation or bowel wall thickening. Increased attenuation of the abdomen question ascites. Bones unremarkable. No urinary tract calcifications.  IMPRESSION: Nonobstructive bowel gas pattern. Question ascites.  Original Report Authenticated By: Lollie Marrow, M.D.   Dg Abd 1 View  09/14/2011  *RADIOLOGY REPORT*  Clinical Data: Abdominal distension.  ABDOMEN - 1 VIEW  Comparison: Abdominal radiograph 09/11/2011.  Findings: There is a small amount of gas and stool scattered throughout the colon extending to the rectum.  No pathologic distension of small bowel is noted.  No gross evidence of pneumoperitoneum is appreciated on this single supine view of the abdomen.  IMPRESSION: 1.  Nonobstructive bowel gas pattern.  No pneumoperitoneum.  Original Report Authenticated By: Florencia Reasons, M.D.   Dg Abd Acute W/chest  09/11/2011  *RADIOLOGY REPORT*  Clinical Data: 21 year old female with renal failure, left abdominal pain and distention.  Vomiting.  ACUTE ABDOMEN SERIES (ABDOMEN 2 VIEW & CHEST 1 VIEW)  Comparison: 08/27/2011 radiographs and 08/24/2011 ET  Findings: Cardiomegaly and pulmonary vascular  congestion identified. A small left pleural effusion and left basilar atelectasis again noted. There is no evidence of pneumothorax.  There is no evidence of bowel obstruction or pneumoperitoneum. Increased hazy opacity overlying the abdomen suggests ascites. No suspicious calcifications are present. There is no evidence of acute bony abnormality.  IMPRESSION: Unremarkable bowel gas pattern - no evidence of bowel obstruction or pneumoperitoneum.  Probable ascites.  Cardiomegaly, pulmonary vascular congestion, small left pleural effusion and left basilar atelectasis.  Original Report Authenticated By: Rosendo Gros, M.D.   Dg Abd Acute W/chest  08/27/2011  *RADIOLOGY REPORT*  Clinical Data: Abdominal pain  ACUTE ABDOMEN SERIES (ABDOMEN 2 VIEW & CHEST 1 VIEW)  Comparison: CT scan 08/24/2011  Findings: Cardiomegaly is noted. No acute infiltrate or pulmonary edema.  There is nonspecific nonobstructive bowel gas pattern.  No small bowel air-fluid levels are noted.  Contrast material from recent CT scan noted throughout the colon.  No free abdominal air.  IMPRESSION: No acute disease.  Cardiomegaly is noted.  Nonspecific nonobstructive bowel gas pattern.  No free abdominal air.  Contrast material from recent CT scan noted throughout the colon.  Original Report Authenticated By: Natasha Mead, M.D.    Medications: Scheduled Meds:   .  amLODipine  10 mg Oral Daily  . carvedilol  25 mg Oral BID WC  . clonazePAM  0.5 mg Oral BID  . divalproex  750 mg Oral Q12H  . enoxaparin  40 mg Subcutaneous Q24H  . furosemide  80 mg Oral Daily  . losartan  100 mg Oral Daily  . sodium bicarbonate  650 mg Oral TID  . sodium chloride  3 mL Intravenous Q12H  . vancomycin  125 mg Oral BID   Followed by  . vancomycin  125 mg Oral Daily   Followed by  . vancomycin  125 mg Oral QODAY   Followed by  . vancomycin  125 mg Oral Q3 days  . DISCONTD: losartan  50 mg Oral Daily   Continuous Infusions:     Assessment/Plan:  Unresponsive episode versus seizure this AM:  - Depakote was recently increased by neurology on 08/27/2011 to 750mg  TID but was decreased to 750mg  BID for increased sedation. I will obtain stat Depakote level, if lower, increase Depakote to the recommended dose by neurology. Will recheck Depakote level in a.m. as well.  Recurrent Cdiff colitis: diarrhea resolved, stools are more formed - Continue vancomycin taper per protocol, florastor  Hypertension uncontrolled:  - Clonidine was discontinued. Patient is currently on Coreg, clonidine, Lasix, losartan which is increased 200 mg daily - Continue PRN hydralazine   ARF on CKD4. Initially it was felt that the patient was volume depleted from diarrhea/vomiting, fluid losses.She did improve back to baseline, after iv fluids. Dr Earlene Plater discussed with Dr. Caryn Section on 09/16/11 and substituted clonidine with losartan. - Monitor BMET closely. Creatinine is stable.    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 - H&H stable for now    Metabolic acidosis: Improved likely secondary to GI loses.  Anoxic brain injury - recovered some functionality after event 03/2011   H/o NICM-stable, continue coreg, restart lasix on 6/30, Last echo 1/13, EF of 35-40%. Started ARB on 09/16/11    Ascites-patient had questionable ascites seen on the x-ray of the abdomen, currently on lasix. Per mother patient was on 7 of Lasix twice daily at home. Then decreased to 80 mg daily. Will continue the same dose.   DVT Prophylaxis: lovenox  Code Status: full code  Disposition: Monitor 24 hours,  Rechecking Depakote level and if the dose needs to be increased we'll recheck the level in a.m.   LOS: 12 days   Quintella Mura M.D. [facility] 09/23/2011, 1:02 PM Pager:  [phone]  If 7PM-7AM, please contact night-coverage [email or web address] Password [email or web address]  Addendum: Recheck Depakote level still within lower normal range 56.7, recheck in a.m. if it is trending down, may need to go up on the dose   Aryonna Gunnerson M.D. [facility] 09/23/2011, 3:42 PM  Pager: [phone]

## 2011-09-23 NOTE — Evaluation (Signed)
Clinical/Bedside Swallow Evaluation Patient Details  Name: Heather Sims MRN: [id number] Date of Birth: 1990-07-13  Today's Date: 09/23/2011 Time: 9147-8295 SLP Time Calculation (min): 15 min  Past Medical History:  Past Medical History  Diagnosis Date  . Pneumonia last 2 weeks    'walking pneumonia'  . Seizures   . Anemia   . Heart attack   . Cardiomyopathy   . Heart failure   . CHF (congestive heart failure)   . Ileitis   . Chronic kidney disease   . Renal disorder   . MPGN (membranoproliferative glomerulonephritis), type 2   . Hypertension   . Brain injury    Past Surgical History:  Past Surgical History  Procedure Date  . Renal biopsy    HPI:  21 y/o female with h/o anoxic brain injury with CIR admission re-admitted with C-diff, acute renal failure, metabolic acidosis, ascites, and non-ischemic cardiomyopathy. Patient was previously consuming a regular diet however per RN report, began pocketing food yesterday evening. Was also noted to have a seizure early this am. Patient known to this SLP from previous admission and previously, once fully alert, was without dysphagia.    Assessment / Plan / Recommendation Clinical Impression  Patient presents with what appears to be baseline swallowing function characterized by full appearing airway protection despite mildly decreased bolus cohesion with soft solids which was also noted during previous admission s/p anoxic BI. Patient safe to continue on a regular diet at this time, eating only when fully alert, and utilizing general safe swallowing precautions. Mom in room for education. No further SLP needs indicated at this time. Please reconsult as needed.     Aspiration Risk  Mild    Diet Recommendation Regular;Thin liquid   Liquid Administration via: Cup;Straw Medication Administration: Whole meds with liquid (or puree per patient preference) Supervision: Patient able to self feed;Intermittent supervision to cue for  compensatory strategies Compensations: Slow rate;Small sips/bites Postural Changes and/or Swallow Maneuvers: Seated upright 90 degrees    Other  Recommendations Oral Care Recommendations: Oral care BID   Follow Up Recommendations  None       Pertinent Vitals/Pain n/a     Swallow Study    General Date of Onset: 09/22/11 HPI: 21 y/o female with h/o anoxic brain injury with CIR admission re-admitted with C-diff, acute renal failure, metabolic acidosis, ascites, and non-ischemic cardiomyopathy. Patient was previously consuming a regular diet however per RN report, began pocketing food yesterday evening. Was also noted to have a seizure early this am. Patient known to this SLP from previous admission and previously, once fully alert, was without dysphagia.  Type of Study: Bedside swallow evaluation Diet Prior to this Study: Regular;Thin liquids Temperature Spikes Noted: No Respiratory Status: Supplemental O2 delivered via (comment) (2 L nasal cannula) History of Recent Intubation: No Behavior/Cognition: Alert;Cooperative;Pleasant mood Oral Cavity - Dentition: Adequate natural dentition Self-Feeding Abilities: Able to feed self Patient Positioning: Upright in bed Baseline Vocal Quality: Clear Volitional Cough: Strong Volitional Swallow: Able to elicit    Oral/Motor/Sensory Function Overall Oral Motor/Sensory Function: Impaired at baseline (slow oral movements however ROM WFL)   Ice Chips Ice chips: Not tested   Thin Liquid Thin Liquid: Within functional limits Presentation: Cup;Self Fed;Straw    Nectar Thick Nectar Thick Liquid: Not tested   Honey Thick Honey Thick Liquid: Not tested   Puree Puree: Within functional limits Presentation: Spoon   Solid Solid: Within functional limits (mildly decreased bolus cohesion; functional and baseline) Presentation: Self Fed   Tacey Ruiz  Jessey Stehlin MA, CCC-SLP [phone]  Ambra Haverstick [name] 09/23/2011,10:11 AM

## 2011-09-23 NOTE — Progress Notes (Addendum)
1610 was notified by secretary to go into pt. Room. While walking down the hall towards the room the nurse tech and charge nurse followed. The patient's mother who had been at the patient's bedside said she noticed that the patient had began to make sounds as if she was "snoring really hard" so she approach the patient and tried to wake her but she appeared unresponsive. However when I entered the room the patient's eyes were now opening but she still appeared lethargic. Rapid response was called around 0248.I noticed that the patient had small amount of clear emesis on her gown. Oxygen was applied at 2 L [place]. Suction was used. More staff came in to assist. Vital signs were taken and documented. The first blood pressure taken was on the automatic machine and reading was elevated and a manual blood pressure was taken to compare. Manual blood pressure was taken and was elevated but less than previous reading. Rapid response came into room to assist and MD was paged and notified. Patient became increasingly responsive and level consciousness improved she is currently alert and oriented which is her baseline. Was told by MD to reassess BP in about 30 minutes and inform them of results. Was also notified by monitor tech while in patient's room that the patient had brief increase in heart rate 120's to 130 non-sustained.

## 2011-09-23 NOTE — Progress Notes (Signed)
0410 MD on call was paged and notified of patient's blood pressure of 175/116 manually. Patient currently has prn hydralazine ordered on the [facility]. Medication was not on hand so pharmacy was notified through epic as well as called and medication was sent up. Patient received prn medication and MD on call returned page. MD on call said to reassess BP again in another 30 minutes and notify of results. Patient bp came down to 154/100 manually. MD on call was paged and notified. No new orders were written. Patient is still A/O x 3 and responsive.

## 2011-09-23 NOTE — Progress Notes (Signed)
Called by bedside RN to assess patient who had questionable episode of unresponsiveness. Upon arrival to the floor, patient responsive to voice, [place] 2L applied to patient, suction setup in room. According to patient's mom, patient began to snore heavy and then started to stare off in space and drool more heavily. NP notified. Patient has history of seizures, and was assessed several weeks ago with the same symptoms. No ativan given, will continue to monitor, advised bedside RN to call with any other issues.

## 2011-09-23 NOTE — Progress Notes (Signed)
[name]  Subjective:  I just stopped by to see Heather Sims (courtesy visit).  She has marked periorbital and pretibial edema. BP is still very high Her weight has increased from 54 kg on 21 June to 63.5 kg today!   Assessment/Plan: I'd recommend increasing dose of furosemide so that periorbital and pretibial edema decrease and see if this helps to decrease BP.  Cr has improved since admission, but she's also volume expanded    LOS: 12 days   Heather Sims F 09/23/2011,6:42 PM   .labalb

## 2011-09-24 DIAGNOSIS — G40309 Generalized idiopathic epilepsy and epileptic syndromes, not intractable, without status epilepticus: Secondary | ICD-10-CM

## 2011-09-24 LAB — BASIC METABOLIC PANEL
BUN: 40 mg/dL — ABNORMAL HIGH (ref 6–23)
GFR calc non Af Amer: 46 mL/min — ABNORMAL LOW (ref >90)
Glucose, Bld: 84 mg/dL (ref 70–99)
Potassium: 5.3 mEq/L — ABNORMAL HIGH (ref 3.5–5.1)

## 2011-09-24 LAB — CBC
HCT: 25.5 % — ABNORMAL LOW (ref 36.0–46.0)
Hemoglobin: 8.3 g/dL — ABNORMAL LOW (ref 12.0–15.0)
MCH: 31.8 pg (ref 26.0–34.0)
MCHC: 32.5 g/dL (ref 30.0–36.0)

## 2011-09-24 MED ORDER — LOSARTAN POTASSIUM 100 MG PO TABS: 100.0000 mg | ORAL_TABLET | Freq: Every day | ORAL | Status: DC

## 2011-09-24 MED ORDER — VANCOMYCIN 50 MG/ML ORAL SOLUTION: 125.0000 mg | Freq: Two times a day (BID) | ORAL | Status: DC

## 2011-09-24 MED ORDER — DIVALPROEX SODIUM 500 MG PO DR TAB
750.0000 mg | DELAYED_RELEASE_TABLET | Freq: Three times a day (TID) | ORAL | Status: DC
  Administered 2011-09-24: 750 mg via ORAL
  Filled 2011-09-24 (×3): qty 1

## 2011-09-24 MED ORDER — FUROSEMIDE 80 MG PO TABS: 80.0000 mg | ORAL_TABLET | Freq: Every day | ORAL | Status: DC

## 2011-09-24 MED ORDER — FUROSEMIDE 80 MG PO TABS
80.0000 mg | ORAL_TABLET | Freq: Two times a day (BID) | ORAL | Status: DC
  Administered 2011-09-24: 80 mg via ORAL
  Filled 2011-09-24 (×3): qty 1

## 2011-09-24 MED ORDER — SODIUM BICARBONATE 650 MG PO TABS: 650.0000 mg | ORAL_TABLET | Freq: Three times a day (TID) | ORAL | Status: DC

## 2011-09-24 MED ORDER — SACCHAROMYCES BOULARDII 250 MG PO CAPS: 250.0000 mg | ORAL_CAPSULE | Freq: Two times a day (BID) | ORAL | Status: AC

## 2011-09-24 NOTE — Discharge Summary (Signed)
Physician Discharge Summary  Patient ID: Heather Sims MRN: [id number] DOB/AGE: May 30, 1990 21 y.o.  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 recurrent C. difficile colitis .Seizure disorder, grand mal .Hypoalbuminemia Uncontrolled HTN Anemia from iron deficiency/CKD Metabolic acidosis stable Brief Unresponsive episode versus seizure  Consults:  None   Discharge Medications: Medication List  As of 09/24/2011  9:44 AM   STOP taking these medications         cloNIDine 0.3 mg/24hr      isosorbide mononitrate 60 MG 24 hr tablet      ondansetron 8 MG disintegrating tablet         TAKE these medications         amLODipine 10 MG tablet   Commonly known as: NORVASC   Take 10 mg by mouth daily.      carvedilol 25 MG tablet   Commonly known as: COREG   Take 25 mg by mouth 2 (two) times daily with a meal.      clonazePAM 0.5 MG tablet   Commonly known as: KLONOPIN   Take 0.5 mg by mouth 2 (two) times daily.      darbepoetin 60 MCG/0.3ML Soln   Commonly known as: ARANESP   Inject 60 mcg into the skin every Wednesday at 6 PM.      divalproex 250 MG DR tablet   Commonly known as: DEPAKOTE   Take 750 mg by mouth 3 (three) times daily.      ferrous sulfate 325 (65 FE) MG tablet   Take 325 mg by mouth 3 (three) times daily.      furosemide 80 MG tablet   Commonly known as: LASIX   Take 1 tablet (80 mg total) by mouth daily.      losartan 100 MG tablet   Commonly known as: COZAAR   Take 1 tablet (100 mg total) by mouth daily.      saccharomyces boulardii 250 MG capsule   Commonly known as: FLORASTOR   Take 1 capsule (250 mg total) by mouth 2 (two) times daily.      sodium bicarbonate 650 MG tablet   Take 1 tablet (650 mg total) by mouth 3 (three) times daily.      vancomycin 50 mg/mL oral  solution   Commonly known as: VANCOCIN   Take 2.5 mLs (125 mg total) by mouth 2 (two) times daily. For another 3 days, then taper to 125 mg daily x 7 days, then taper to 125mg  every other day x 7days, then taper to 125mg  every 3 days for 7 days, then OFF             Brief H and P: For complete details please refer to admission H and P, but in brief patient is a 21 year old female with past medical history of pneumonia, seizures, anemia, cardiomyopathy, CHF, brain injury with anoxic brain damage, chronic kidney disease, MPGN, hypertension presented on 09/11/2011 with one-week history of diarrhea, nausea and vomiting. Patient has a history of C. difficile colitis and had finished her course of Flagyl. She also complained of abdominal pain and hard time keeping fluids down. She otherwise denied any fevers, shortness of breath or chest pain. Patient also has complex medical history, apparently was in normal state of health until January of 2013 when she had acute respiratory arrest due to  pulmonary edema in the setting of acute kidney failure and CHF, NICM EF 35-40%, which resulted in anoxic brain injury. Renal biopsy had shown nephritis.  Hospital Course:   The patient was admitted with recurrent C. difficile colitis, acute renal failure, metabolic acidosis, ascites and a nonischemic cardiomyopathy. Patient's diarrhea improved to over the course of time during hospitalization. Patient was started on oral vancomycin with taper for recurrent C. difficile colitis. C. difficile PCR assay was initially positive on 08/26/2011 and then subsequently on 09/12/2011. The metabolic acidosis and acute renal failure also improved significantly with improvement of the diarrhea. He should was noticed to have abdominal distention however the abdominal x-ray did not show any bowel obstruction but some ascites. Patient was started on home dose of Lasix. However prior to the day of discharge, the patient's nephrologist, Dr.  Caryn Section noticed patient having increasing periorbital edema hence recommended to increase his Lasix. Patient was advised to continue Lasix 80 mg twice a day for next 3 days and followup with Dr. Caryn Section early next week  The nurses noted that patient may be pocketing food in her mouth and may be having some difficulty swallowing. Speech evaluation was done and noted no significant aspiration and recommended regular diet. On 09/23/2011 a.m. patient had a rapid response with a brief unresponsive episode with possible more drooling. Patient was also noticed to have uncontrolled hypertension, patient's alertness improved within brief period of time. There was a question if patient had a seizure episode. Depakote was decreased to 750mg BID (<- 750mg  TID) on 09/16/11 due to sedation. Per Neurology recommendations, depakote was increased to 750mg  TID on 08/27/11. Depakote level was checked and was borderline low at 56 on 09/23/2011 however was lower than therapeutic at 48.4 on a.m. of 09/24/2011. Patient was placed back on Depakote 750 mg 3 times a day which was initially recommended by nephrology service. I recommended the patient and her mother to obtain BMET, Depakote level on Monday. Patient was also noticed to have leukocytosis on the day of discharge at 26.3. Patient is symptomatic improving, having no diarrhea or any clinical symptoms of infection, afebrile. I also discussed in detail with Dr. Cliffton Asters who also felt that this is likely an aberrant result and nonspecific given her overall improvement in patient's condition. Home health PT, RN followup was set up.   Day of Discharge BP 149/115  Pulse 104  Temp 99.7 F (37.6 C) (Oral)  Resp 18  Ht 5\' 2"  (1.575 m)  Wt 63.912 kg (140 lb 14.4 oz)  BMI 25.77 kg/m2  SpO2 98%  LMP 05/24/2011  Physical Exam: General: Alert and awake oriented x3 not in any acute distress. HEENT: anicteric sclera, pupils reactive to light and accommodation CVS: S1-S2 clear no murmur  rubs or gallops Chest: clear to auscultation bilaterally, no wheezing rales or rhonchi Abdomen: soft nontender, nondistended, normal bowel sounds, no organomegaly Extremities: no cyanosis, clubbing or edema noted bilaterally Neuro: Cranial nerves II-XII intact, no focal neurological deficits   The results of significant diagnostics from this hospitalization (including imaging, microbiology, ancillary and laboratory) are listed below for reference.    LAB RESULTS: Basic Metabolic Panel:  Lab 09/24/11 1610 09/23/11 0620  NA 138 139  K 5.3* 5.1  CL 109 110  CO2 21 22  GLUCOSE 84 91  BUN 40* 41*  CREATININE 1.56* 1.43*  CALCIUM 8.7 8.8  MG -- --  PHOS -- --   CBC:  Lab 09/24/11 0615 09/23/11 0620  WBC 26.3* 13.1*  NEUTROABS -- --  HGB 8.3* 8.9*  HCT 25.5* 26.6*  MCV 97.7 --  PLT 180 172   Significant Diagnostic Studies:  Dg Abd Acute W/chest  09/11/2011  *RADIOLOGY REPORT*  Clinical Data: 21 year old female with renal failure, left abdominal pain and distention.  Vomiting.  ACUTE ABDOMEN SERIES (ABDOMEN 2 VIEW & CHEST 1 VIEW)  Comparison: 08/27/2011 radiographs and 08/24/2011 ET  Findings: Cardiomegaly and pulmonary vascular congestion identified. A small left pleural effusion and left basilar atelectasis again noted. There is no evidence of pneumothorax.  There is no evidence of bowel obstruction or pneumoperitoneum. Increased hazy opacity overlying the abdomen suggests ascites. No suspicious calcifications are present. There is no evidence of acute bony abnormality.  IMPRESSION: Unremarkable bowel gas pattern - no evidence of bowel obstruction or pneumoperitoneum.  Probable ascites.  Cardiomegaly, pulmonary vascular congestion, small left pleural effusion and left basilar atelectasis.  Original Report Authenticated By: Rosendo Gros, M.D.     Disposition and Follow-up: Discharge Orders    Future Appointments: Provider: Department: Dept Phone: Center:   09/28/2011 12:00 PM  Ranelle Oyster, MD [facility] [phone] [facility]   09/29/2011 11:15 AM Berneice Heinrich, PT [facility] [phone] [facility]     Future Orders Please Complete By Expires   Diet - low sodium heart healthy      Increase activity slowly      Discharge instructions      Comments:   Please take Lasix 80mg  twice/ day for 3 days then continue daily, obtain BMET (renal function), Depakote level on Monday 09/28/11. Follow up with [name] early next week.       DISPOSITION: Home with home health, PT, RN  DIET: Heart healthy diet  ACTIVITY: As tolerated  TESTS THAT NEED FOLLOW-UP BMET, Depakote level on Monday 09/28/11  DISCHARGE FOLLOW-UP Follow-up Information    Follow up with Jaclyn Shaggy, MD in 1 week.   Contact information:   [address]  Pediatric Medicine [place] [address] [phone]       Follow up with Shirley Friar., MD on 10/23/2011. (Please be there at 1pm. If [facility] does not approve appointment, self-pay rates will apply,. Sharonda  in office made this appointment and is supposed to notify you if self pay rates will apply,.)    Contact information:   [address], [address] [employer], [place]. [place] [address] [phone]       Follow up with Zada Girt, MD. Schedule an appointment as soon as possible for a visit in 1 week. (you need BMET (kidney function), depakote level checked on Monday)    Contact information:   [address] [employer] [place] [address] [phone]          Time spent on Discharge: 45 minutes  Signed:   Kearia Yin M.D. [facility] 09/24/2011, 9:44 AM Pager: [phone]  If 7PM-7AM, please contact night-coverage [email or web address] Password [email or web address]

## 2011-09-24 NOTE — Progress Notes (Signed)
   CARE MANAGEMENT NOTE 09/24/2011  Patient:  Heather Sims, Heather Sims   Account Number:  [id number]  Date Initiated:  09/16/2011  Documentation initiated by:  Tera Mater  Subjective/Objective Assessment:   21yo female admitted with dehydration. Pt. lives with mother at home     Action/Plan:   Discharge planning   Anticipated DC Date:  09/17/2011   Anticipated DC Plan:  HOME/SELF CARE      DC Planning Services  CM consult      Choice offered to / List presented to:             Status of service:  In process, will continue to follow Medicare Important Message given?   (If response is "NO", the following Medicare IM given date fields will be blank) Date Medicare IM given:   Date Additional Medicare IM given:    Discharge Disposition:    Per UR Regulation:  Reviewed for med. necessity/level of care/duration of stay  If discussed at Long Length of Stay Meetings, dates discussed:    Comments:  09/24/2011 [address] RN, [place] [phone] Met with patient's mother to discuss discharge planning and home health services.  She had home health [facility] in the past and had been discharged. She has an appointment scheduled for outpatient PT on Tuesday. Explained if she is able to go for outpatient PT, she would not meet criteria of homebound status for home health services. CM will follow up with MD regarding home health services. Discussed with Dr Isidoro Donning patient going for outpatient PT and would not meet criteria for home health services if able to go outpatient services. She will cancel the home health services. Advised patient's mothe. She would like to change PCP's. Suggested she contact [facility] for the change. She wants a recommendation for a new PCP. CM cannot make recommendations for a PCP.  09/17/11 1000 Noted that Medicaid will pay for Vancomycin po in capsule form.  Made Dr. Lavera Guise aware of this information. Tera Mater, RN, [name] NCM [phone]  09/16/11 1500 NCM placed a  benefits check for Vancomycin 125mg  po QID sent today to inquire how much Medicaid will pay for.  Anticipated discharge is 1-2 days.  Will follow pt. for further discharge needs. Tera Mater, RN, [name] NCM [phone]

## 2011-09-24 NOTE — Progress Notes (Signed)
Pt d/c to home with Mom. Discharge instructions and medication reviewed with PT and mom. Mom and Pt questions answered. Mom and Pt state understanding

## 2011-09-28 ENCOUNTER — Encounter: Payer: Medicaid Other | Admitting: Physical Medicine & Rehabilitation

## 2011-09-29 ENCOUNTER — Ambulatory Visit: Payer: Medicaid Other | Admitting: Rehabilitative and Restorative Service Providers"

## 2011-10-09 ENCOUNTER — Inpatient Hospital Stay (HOSPITAL_COMMUNITY)
Admission: EM | Admit: 2011-10-09 | Discharge: 2011-10-16 | DRG: 391 | Disposition: A | Discharge status: Home / Self Care | Payer: Medicaid Other | Attending: Family Medicine | Admitting: Family Medicine

## 2011-10-09 ENCOUNTER — Encounter (HOSPITAL_COMMUNITY): Payer: Self-pay | Admitting: *Deleted

## 2011-10-09 DIAGNOSIS — R188 Other ascites: Secondary | ICD-10-CM | POA: Diagnosis not present

## 2011-10-09 DIAGNOSIS — R112 Nausea with vomiting, unspecified: Secondary | ICD-10-CM | POA: Diagnosis present

## 2011-10-09 DIAGNOSIS — Z8249 Family history of ischemic heart disease and other diseases of the circulatory system: Secondary | ICD-10-CM

## 2011-10-09 DIAGNOSIS — Z8669 Personal history of other diseases of the nervous system and sense organs: Secondary | ICD-10-CM | POA: Diagnosis present

## 2011-10-09 DIAGNOSIS — N189 Chronic kidney disease, unspecified: Secondary | ICD-10-CM | POA: Diagnosis present

## 2011-10-09 DIAGNOSIS — G40909 Epilepsy, unspecified, not intractable, without status epilepticus: Secondary | ICD-10-CM | POA: Diagnosis present

## 2011-10-09 DIAGNOSIS — G931 Anoxic brain damage, not elsewhere classified: Secondary | ICD-10-CM | POA: Diagnosis present

## 2011-10-09 DIAGNOSIS — I120 Hypertensive chronic kidney disease with stage 5 chronic kidney disease or end stage renal disease: Secondary | ICD-10-CM | POA: Diagnosis present

## 2011-10-09 DIAGNOSIS — R197 Diarrhea, unspecified: Secondary | ICD-10-CM

## 2011-10-09 DIAGNOSIS — N179 Acute kidney failure, unspecified: Secondary | ICD-10-CM | POA: Diagnosis present

## 2011-10-09 DIAGNOSIS — A0472 Enterocolitis due to Clostridium difficile, not specified as recurrent: Secondary | ICD-10-CM | POA: Diagnosis present

## 2011-10-09 DIAGNOSIS — I10 Essential (primary) hypertension: Secondary | ICD-10-CM | POA: Diagnosis present

## 2011-10-09 DIAGNOSIS — E46 Unspecified protein-calorie malnutrition: Secondary | ICD-10-CM | POA: Diagnosis present

## 2011-10-09 DIAGNOSIS — N186 End stage renal disease: Secondary | ICD-10-CM | POA: Diagnosis present

## 2011-10-09 DIAGNOSIS — Z833 Family history of diabetes mellitus: Secondary | ICD-10-CM

## 2011-10-09 DIAGNOSIS — R569 Unspecified convulsions: Secondary | ICD-10-CM | POA: Diagnosis present

## 2011-10-09 DIAGNOSIS — I509 Heart failure, unspecified: Secondary | ICD-10-CM

## 2011-10-09 DIAGNOSIS — E871 Hypo-osmolality and hyponatremia: Secondary | ICD-10-CM | POA: Diagnosis present

## 2011-10-09 DIAGNOSIS — N056 Unspecified nephritic syndrome with dense deposit disease: Secondary | ICD-10-CM | POA: Diagnosis present

## 2011-10-09 DIAGNOSIS — E860 Dehydration: Secondary | ICD-10-CM | POA: Diagnosis present

## 2011-10-09 DIAGNOSIS — I428 Other cardiomyopathies: Secondary | ICD-10-CM | POA: Diagnosis present

## 2011-10-09 DIAGNOSIS — E876 Hypokalemia: Secondary | ICD-10-CM | POA: Diagnosis not present

## 2011-10-09 DIAGNOSIS — N059 Unspecified nephritic syndrome with unspecified morphologic changes: Secondary | ICD-10-CM | POA: Diagnosis present

## 2011-10-09 LAB — COMPREHENSIVE METABOLIC PANEL
ALT: <5 U/L (ref 0–35)
BUN: 51 mg/dL — ABNORMAL HIGH (ref 6–23)
Calcium: 8.2 mg/dL — ABNORMAL LOW (ref 8.4–10.5)
Creatinine, Ser: 3.93 mg/dL — ABNORMAL HIGH (ref 0.50–1.10)
GFR calc Af Amer: 18 mL/min — ABNORMAL LOW (ref >90)
Glucose, Bld: 88 mg/dL (ref 70–99)
Sodium: 146 mEq/L — ABNORMAL HIGH (ref 135–145)
Total Protein: 6.5 g/dL (ref 6.0–8.3)

## 2011-10-09 LAB — CBC WITH DIFFERENTIAL/PLATELET
Eosinophils Absolute: 0.1 10*3/uL (ref 0.0–0.7)
Eosinophils Relative: 1 % (ref 0–5)
Lymphs Abs: 1 10*3/uL (ref 0.7–4.0)
MCH: 32.4 pg (ref 26.0–34.0)
MCV: 95.6 fL (ref 78.0–100.0)
Monocytes Absolute: 1.2 10*3/uL — ABNORMAL HIGH (ref 0.1–1.0)
Platelets: 157 10*3/uL (ref 150–400)
RBC: 2.93 MIL/uL — ABNORMAL LOW (ref 3.87–5.11)

## 2011-10-09 MED ORDER — SODIUM CHLORIDE 0.9 % IV BOLUS (SEPSIS)
1000.0000 mL | Freq: Once | INTRAVENOUS | Status: AC
Start: 2011-10-09 — End: 2011-10-10
  Administered 2011-10-09: 1000 mL via INTRAVENOUS

## 2011-10-09 MED ORDER — ACETAMINOPHEN 325 MG PO TABS
650.0000 mg | ORAL_TABLET | Freq: Once | ORAL | Status: AC
  Administered 2011-10-09: 650 mg via ORAL
  Filled 2011-10-09: qty 2

## 2011-10-09 NOTE — ED Notes (Signed)
abd pain with nv and diarrhea since last Saturday.  She has c diff and was sent here for admission.  This has been an ongoing problem lmp today

## 2011-10-09 NOTE — ED Provider Notes (Signed)
History     CSN: [id number]  Arrival date & time 10/09/11  1757   First MD Initiated Contact with Patient 10/09/11 2303      Chief Complaint  Patient presents with  . Abdominal Pain     Patient is a 21 y.o. female presenting with abdominal pain. The history is provided by the patient.  Abdominal Pain The primary symptoms of the illness include fever, nausea and vomiting. The current episode started more than 2 days ago. The onset of the illness was gradual. The problem has been gradually worsening.  Additional symptoms associated with the illness include chills.  pt presents from home for vomiting/diarrhea She has had cdif previously, currently on vancomycin but in past week has had recurrence of diarrhea Fever has been noted She also reports abdominal pain   Past Medical History  Diagnosis Date  . Pneumonia last 2 weeks    'walking pneumonia'  . Seizures   . Anemia   . Heart attack   . Cardiomyopathy   . Heart failure   . CHF (congestive heart failure)   . Ileitis   . Chronic kidney disease   . Renal disorder   . MPGN (membranoproliferative glomerulonephritis), type 2   . Hypertension   . Brain injury     Past Surgical History  Procedure Date  . Renal biopsy     Family History  Problem Relation Age of Onset  . Hypertension Mother   . Diabetes Father     History  Substance Use Topics  . Smoking status: Never Smoker   . Smokeless tobacco: Never Used  . Alcohol Use: No    OB History    Grav Para Term Preterm Abortions TAB SAB Ect Mult Living                  Review of Systems  Constitutional: Positive for fever and chills.  Gastrointestinal: Positive for nausea and vomiting.  All other systems reviewed and are negative.    Allergies  Review of patient's allergies indicates no known allergies.  Home Medications   Current Outpatient Rx  Name Route Sig Dispense Refill  . AMLODIPINE BESYLATE 10 MG PO TABS Oral Take 10 mg by mouth daily.    Marland Kitchen  CARVEDILOL 25 MG PO TABS Oral Take 25 mg by mouth 2 (two) times daily with a meal.    . CLONAZEPAM 0.5 MG PO TABS Oral Take 0.5 mg by mouth 2 (two) times daily.    Marland Kitchen DARBEPOETIN ALFA-POLYSORBATE 60 MCG/0.3ML IJ SOLN Subcutaneous Inject 60 mcg into the skin every Wednesday at 6 PM.    . DIVALPROEX SODIUM 250 MG PO TBEC Oral Take 750 mg by mouth 3 (three) times daily.    Marland Kitchen FERROUS SULFATE 325 (65 FE) MG PO TABS Oral Take 325 mg by mouth 3 (three) times daily.    . FUROSEMIDE 80 MG PO TABS Oral Take 1 tablet (80 mg total) by mouth daily. 60 tablet 3  . LOSARTAN POTASSIUM 100 MG PO TABS Oral Take 1 tablet (100 mg total) by mouth daily. 60 tablet 3  . SODIUM BICARBONATE 650 MG PO TABS Oral Take 1 tablet (650 mg total) by mouth 3 (three) times daily. 90 tablet 0  . VANCOMYCIN 50 MG/ML ORAL SOLUTION Oral Take 2.5 mLs (125 mg total) by mouth 2 (two) times daily. For another 3 days, then taper to 125 mg daily x 7 days, then taper to 125mg  every other day x 7days, then taper to  125mg  every 3 days for 7 days, then OFF 30 mL 3    BP 119/84  Pulse 91  Temp 100.4 F (38 C) (Oral)  Resp 18  SpO2 96%  LMP 10/09/2011  Physical Exam CONSTITUTIONAL: thin, ill appearing HEAD AND FACE: Normocephalic/atraumatic EYES: EOMI/PERRL, no icterus ENMT: Mucous membranes dry NECK: supple no meningeal signs CV: S1/S2 noted, no murmurs/rubs/gallops noted LUNGS: Lungs are clear to auscultation bilaterally, no apparent distress ABDOMEN: soft, distended but no tenderness, no rebound or guarding NEURO: Pt is awake/alert, moves all extremitiesx4.  Answers most questions appropriately EXTREMITIES: pulses normal, full ROM SKIN: warm, color normal PSYCH: no abnormalities of mood noted  ED Course  Procedures  Labs Reviewed  CBC WITH DIFFERENTIAL - Abnormal; Notable for the following:    RBC 2.93 (*)     Hemoglobin 9.5 (*)     HCT 28.0 (*)     RDW 15.6 (*)     Lymphocytes Relative 11 (*)     Monocytes Relative 13  (*)     Monocytes Absolute 1.2 (*)     All other components within normal limits  COMPREHENSIVE METABOLIC PANEL - Abnormal; Notable for the following:    Sodium 146 (*)     BUN 51 (*)     Creatinine, Ser 3.93 (*)     Calcium 8.2 (*)     Albumin 2.1 (*)     Alkaline Phosphatase 37 (*)     Total Bilirubin 0.1 (*)     GFR calc non Af Amer 15 (*)     GFR calc Af Amer 18 (*)     All other components within normal limits  CULTURE, BLOOD (ROUTINE X 2)  CULTURE, BLOOD (ROUTINE X 2)  LACTIC ACID, PLASMA   11:46 PM Pt with h/o cdif, now having recurrent diarrhea, she is dehydrated with acute kidney injury will likely require admission for IV fluids  D/w FPC, will admit for rehydration Likely has cdif recurrence Pt stable in the ED, will defer imaging at this time    MDM  Nursing notes including past medical history and social history reviewed and considered in documentation Previous records reviewed and considered - h/o cdif All labs/vitals reviewed and considered         Joya Gaskins, MD 10/10/11 0001

## 2011-10-10 ENCOUNTER — Encounter (HOSPITAL_COMMUNITY): Payer: Self-pay | Admitting: Family Medicine

## 2011-10-10 LAB — CBC
HCT: 23.4 % — ABNORMAL LOW (ref 36.0–46.0)
HCT: 28 % — ABNORMAL LOW (ref 36.0–46.0)
Hemoglobin: 7.7 g/dL — ABNORMAL LOW (ref 12.0–15.0)
MCH: 31.6 pg (ref 26.0–34.0)
MCHC: 32.9 g/dL (ref 30.0–36.0)
MCV: 95.9 fL (ref 78.0–100.0)
MCV: 95.2 fL (ref 78.0–100.0)
Platelets: 124 10*3/uL — ABNORMAL LOW (ref 150–400)
RBC: 2.44 MIL/uL — ABNORMAL LOW (ref 3.87–5.11)
RBC: 2.94 MIL/uL — ABNORMAL LOW (ref 3.87–5.11)
WBC: 11.4 10*3/uL — ABNORMAL HIGH (ref 4.0–10.5)

## 2011-10-10 LAB — URINALYSIS, ROUTINE W REFLEX MICROSCOPIC
Bilirubin Urine: NEGATIVE
Glucose, UA: NEGATIVE mg/dL
Ketones, ur: NEGATIVE mg/dL
Protein, ur: 30 mg/dL — AB
pH: 5 (ref 5.0–8.0)

## 2011-10-10 LAB — URINE MICROSCOPIC-ADD ON

## 2011-10-10 LAB — BASIC METABOLIC PANEL
BUN: 52 mg/dL — ABNORMAL HIGH (ref 6–23)
CO2: 23 mEq/L (ref 19–32)
CO2: 25 mEq/L (ref 19–32)
Calcium: 7.6 mg/dL — ABNORMAL LOW (ref 8.4–10.5)
Chloride: 100 mEq/L (ref 96–112)
Chloride: 101 mEq/L (ref 96–112)
Creatinine, Ser: 3.91 mg/dL — ABNORMAL HIGH (ref 0.50–1.10)
Creatinine, Ser: 3.69 mg/dL — ABNORMAL HIGH (ref 0.50–1.10)
Glucose, Bld: 82 mg/dL (ref 70–99)
Potassium: 3.2 mEq/L — ABNORMAL LOW (ref 3.5–5.1)

## 2011-10-10 LAB — OCCULT BLOOD X 1 CARD TO LAB, STOOL: Fecal Occult Bld: NEGATIVE

## 2011-10-10 LAB — CLOSTRIDIUM DIFFICILE BY PCR: Toxigenic C. Difficile by PCR: NEGATIVE

## 2011-10-10 LAB — LACTIC ACID, PLASMA: Lactic Acid, Venous: 0.7 mmol/L (ref 0.5–2.2)

## 2011-10-10 MED ORDER — SODIUM CHLORIDE 0.9 % IV SOLN: 250.0000 mL | INTRAVENOUS | Status: DC | PRN

## 2011-10-10 MED ORDER — AMLODIPINE BESYLATE 10 MG PO TABS
10.0000 mg | ORAL_TABLET | Freq: Every day | ORAL | Status: DC
  Administered 2011-10-10 – 2011-10-16 (×7): 10 mg via ORAL
  Filled 2011-10-10 (×7): qty 1

## 2011-10-10 MED ORDER — SODIUM CHLORIDE 0.9 % IV SOLN
INTRAVENOUS | Status: DC
  Administered 2011-10-10 (×3): via INTRAVENOUS

## 2011-10-10 MED ORDER — DIVALPROEX SODIUM 500 MG PO DR TAB
750.0000 mg | DELAYED_RELEASE_TABLET | Freq: Three times a day (TID) | ORAL | Status: DC
  Administered 2011-10-10 (×3): 750 mg via ORAL
  Filled 2011-10-10 (×4): qty 1

## 2011-10-10 MED ORDER — FERROUS SULFATE 325 (65 FE) MG PO TABS
325.0000 mg | ORAL_TABLET | Freq: Three times a day (TID) | ORAL | Status: DC
  Administered 2011-10-10 – 2011-10-16 (×17): 325 mg via ORAL
  Filled 2011-10-10 (×21): qty 1

## 2011-10-10 MED ORDER — DARBEPOETIN ALFA-POLYSORBATE 60 MCG/0.3ML IJ SOLN
60.0000 ug | INTRAMUSCULAR | Status: DC
  Administered 2011-10-14: 60 ug via SUBCUTANEOUS
  Filled 2011-10-10: qty 0.3

## 2011-10-10 MED ORDER — ACETAMINOPHEN 650 MG RE SUPP: 650.0000 mg | Freq: Four times a day (QID) | RECTAL | Status: DC | PRN

## 2011-10-10 MED ORDER — DIVALPROEX SODIUM 125 MG PO CPSP
750.0000 mg | ORAL_CAPSULE | Freq: Three times a day (TID) | ORAL | Status: DC
  Administered 2011-10-10 – 2011-10-16 (×17): 750 mg via ORAL
  Filled 2011-10-10 (×20): qty 6

## 2011-10-10 MED ORDER — ACETAMINOPHEN 325 MG PO TABS: 650.0000 mg | ORAL_TABLET | Freq: Four times a day (QID) | ORAL | Status: DC | PRN

## 2011-10-10 MED ORDER — SODIUM CHLORIDE 0.9 % IJ SOLN: 3.0000 mL | INTRAMUSCULAR | Status: DC | PRN

## 2011-10-10 MED ORDER — CLONAZEPAM 0.5 MG PO TABS
0.5000 mg | ORAL_TABLET | Freq: Two times a day (BID) | ORAL | Status: DC
  Administered 2011-10-10 – 2011-10-16 (×12): 0.5 mg via ORAL
  Filled 2011-10-10 (×13): qty 1

## 2011-10-10 MED ORDER — WHITE PETROLATUM GEL
Status: AC
  Administered 2011-10-10: 17:00:00
  Filled 2011-10-10: qty 5

## 2011-10-10 MED ORDER — POTASSIUM CHLORIDE CRYS ER 20 MEQ PO TBCR
40.0000 meq | EXTENDED_RELEASE_TABLET | Freq: Once | ORAL | Status: AC
  Administered 2011-10-10: 40 meq via ORAL
  Filled 2011-10-10: qty 2

## 2011-10-10 MED ORDER — ONDANSETRON HCL 4 MG/2ML IJ SOLN: 4.0000 mg | INTRAMUSCULAR | Status: DC | PRN

## 2011-10-10 MED ORDER — VANCOMYCIN 50 MG/ML ORAL SOLUTION
125.0000 mg | Freq: Four times a day (QID) | ORAL | Status: DC
  Administered 2011-10-10 – 2011-10-14 (×13): 125 mg via ORAL
  Filled 2011-10-10 (×22): qty 2.5

## 2011-10-10 MED ORDER — SODIUM CHLORIDE 0.9 % IJ SOLN
3.0000 mL | Freq: Two times a day (BID) | INTRAMUSCULAR | Status: DC
  Administered 2011-10-10 – 2011-10-16 (×9): 3 mL via INTRAVENOUS

## 2011-10-10 MED ORDER — VANCOMYCIN 50 MG/ML ORAL SOLUTION
125.0000 mg | Freq: Two times a day (BID) | ORAL | Status: DC
  Administered 2011-10-10: 125 mg via ORAL
  Filled 2011-10-10 (×3): qty 2.5

## 2011-10-10 MED ORDER — CARVEDILOL 25 MG PO TABS
25.0000 mg | ORAL_TABLET | Freq: Two times a day (BID) | ORAL | Status: DC
  Administered 2011-10-10 – 2011-10-16 (×12): 25 mg via ORAL
  Filled 2011-10-10 (×16): qty 1

## 2011-10-10 MED ORDER — ONDANSETRON HCL 4 MG PO TABS: 4.0000 mg | ORAL_TABLET | ORAL | Status: DC | PRN

## 2011-10-10 MED ORDER — CHLORHEXIDINE GLUCONATE 0.12 % MT SOLN
15.0000 mL | Freq: Two times a day (BID) | OROMUCOSAL | Status: DC
  Administered 2011-10-10 – 2011-10-13 (×4): 15 mL via OROMUCOSAL
  Filled 2011-10-10 (×5): qty 15

## 2011-10-10 MED ORDER — SODIUM BICARBONATE 650 MG PO TABS
650.0000 mg | ORAL_TABLET | Freq: Three times a day (TID) | ORAL | Status: DC
  Administered 2011-10-10 – 2011-10-11 (×4): 650 mg via ORAL
  Filled 2011-10-10 (×6): qty 1

## 2011-10-10 MED ORDER — BIOTENE DRY MOUTH MT LIQD
15.0000 mL | Freq: Two times a day (BID) | OROMUCOSAL | Status: DC
  Administered 2011-10-10 – 2011-10-12 (×5): 15 mL via OROMUCOSAL

## 2011-10-10 MED ORDER — HEPARIN SODIUM (PORCINE) 5000 UNIT/ML IJ SOLN
5000.0000 [IU] | Freq: Three times a day (TID) | INTRAMUSCULAR | Status: DC
  Administered 2011-10-10 – 2011-10-16 (×18): 5000 [IU] via SUBCUTANEOUS
  Filled 2011-10-10 (×22): qty 1

## 2011-10-10 MED ORDER — DIVALPROEX SODIUM 500 MG PO DR TAB
750.0000 mg | DELAYED_RELEASE_TABLET | Freq: Three times a day (TID) | ORAL | Status: DC
  Filled 2011-10-10 (×3): qty 1

## 2011-10-10 MED ORDER — POTASSIUM CHLORIDE CRYS ER 20 MEQ PO TBCR: 40.0000 meq | EXTENDED_RELEASE_TABLET | Freq: Two times a day (BID) | ORAL | Status: DC

## 2011-10-10 NOTE — Consult Note (Signed)
Heather Sims 10/10/2011 Heather Sims D Requesting Physician:  Dr. Leveda Anna  Reason for Consult:  Acute on CKD HPI: The patient is a 21 y.o. year-old w hx of out-of -hospital cardiac arrest in Jan 2013 at which time she presented with severe pulm edema, anasarca and renal disease which was subsequently diagnosed as stage IV CKD w C3 nephropathy.  Baseline creat improved to 1.5-2 and she is followed for CKD by Dr. Caryn Section at [facility]. She was here w Cdif and vol depletion with acute on CRF in June and creat peaked at 2.8 and improved to 1.56 at discharge. Admitted now with diarrhea and creat of 3.93.    Patient is without complaints. 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 UA show 3-6 rbc and 0-2 WBC, 30 protein.   Creatinine, Ser  Date/Time Value Range Status  10/10/2011 12:05 PM 3.69* 0.50 - 1.10 mg/dL Final  10/31/9145  8:29 AM 3.91* 0.50 - 1.10 mg/dL Final  5/62/1308  6:57 PM 3.93* 0.50 - 1.10 mg/dL Final  10/24/6960  9:52 AM 1.56* 0.50 - 1.10 mg/dL Final  10/25/1322  4:01 AM 1.43* 0.50 - 1.10 mg/dL Final  0/04/7251  6:64 AM 1.42* 0.50 - 1.10 mg/dL Final  4/0/3474  2:59 AM 1.43* 0.50 - 1.10 mg/dL Final  5/63/8756  4:33 AM 1.43* 0.50 - 1.10 mg/dL Final  2/95/1884  1:66 AM 1.53* 0.50 - 1.10 mg/dL Final  0/63/0160  1:09 AM 1.65* 0.50 - 1.10 mg/dL Final  06/13/5571  2:20 AM 1.70* 0.50 - 1.10 mg/dL Final  2/54/2706  2:37 AM 1.84* 0.50 - 1.10 mg/dL Final  09/17/3149  7:61 AM 1.96* 0.50 - 1.10 mg/dL Final  08/27/3708  6:26 AM 2.16* 0.50 - 1.10 mg/dL Final  9/48/5462  7:03 AM 2.49* 0.50 - 1.10 mg/dL Final  5/00/9381  8:29 AM 2.86* 0.50 - 1.10 mg/dL Final  9/37/1696  7:89 PM 3.19* 0.50 - 1.10 mg/dL Final  3/81/0175  1:02 PM 2.48* 0.50 - 1.10 mg/dL Final  5/85/2778 24:23 AM 2.42* 0.50 - 1.10 mg/dL Final  07/24/6142  3:15 AM 2.18* 0.50 - 1.10 mg/dL Final  4/0/0867  6:19 AM 2.07* 0.50 - 1.10 mg/dL Final  5/0/9326  7:12 AM 2.12* 0.50 - 1.10 mg/dL Final  06/25/8097  8:33 AM 2.21* 0.50 - 1.10 mg/dL Final    10/23/5051 97:67 PM 2.26* 0.50 - 1.10 mg/dL Final  05/25/1935  9:02 PM 2.42* 0.50 - 1.10 mg/dL Final  06/30/7351  2:99 PM 2.41* 0.50 - 1.10 mg/dL Final  04/26/2681  4:19 AM 2.82* 0.50 - 1.10 mg/dL Final  09/11/2977 89:21 AM 2.51* 0.50 - 1.10 mg/dL Final  1/94/1740  8:14 AM 2.64* 0.50 - 1.10 mg/dL Final  4/81/8563  1:49 AM 2.35* 0.50 - 1.10 mg/dL Final  09/21/6376  5:88 AM 2.95* 0.50 - 1.10 mg/dL Final  07/23/7739  2:87 AM 2.87* 0.50 - 1.10 mg/dL Final  8/67/6720  9:47 AM 2.71* 0.50 - 1.10 mg/dL Final  0/96/2836  6:29 AM 2.30* 0.50 - 1.10 mg/dL Final  4/76/5465  0:35 AM 2.21* 0.50 - 1.10 mg/dL Final  4/65/6812  7:51 AM 3.20* 0.50 - 1.10 mg/dL Final  7/00/1749 44:96 PM 3.27* 0.50 - 1.10 mg/dL Final  09/24/9161  8:46 AM 3.73* 0.50 - 1.10 mg/dL Final  08/25/9933  7:01 AM 3.51* 0.50 - 1.10 mg/dL Final  09/26/9388  3:00 AM 3.38* 0.50 - 1.10 mg/dL Final  11/22/3298  7:62 AM 3.18* 0.50 - 1.10 mg/dL Final  04/28/3333  4:56 AM 2.88* 0.50 -  1.10 mg/dL Final  1/61/0960  4:54 AM 2.79* 0.50 - 1.10 mg/dL Final  0/98/1191  4:78 AM 2.80* 0.50 - 1.10 mg/dL Final  2/95/6213  0:86 AM 2.62* 0.50 - 1.10 mg/dL Final  5/78/4696  2:95 AM 2.61* 0.50 - 1.10 mg/dL Final  2/84/1324  4:01 AM 2.55* 0.50 - 1.10 mg/dL Final  0/27/2536  6:44 AM 2.55* 0.50 - 1.10 mg/dL Final  0/34/7425  9:56 AM 1.82* 0.50 - 1.10 mg/dL Final  3/87/5643  3:29 PM 1.94* 0.50 - 1.10 mg/dL Final  08/08/8414  6:06 AM 1.95* 0.50 - 1.10 mg/dL Final  05/21/6008  9:32 AM 1.99* 0.50 - 1.10 mg/dL Final    Past Medical History:  Past Medical History  Diagnosis Date  . Pneumonia     'walking pneumonia'  . Seizures   . Anemia   . Heart attack   . Cardiomyopathy   . Heart failure   . CHF (congestive heart failure)   . Ileitis   . Chronic kidney disease   . Renal disorder   . MPGN (membranoproliferative glomerulonephritis), type 2   . Hypertension   . Brain injury     Past Surgical History:  Past Surgical History  Procedure Date  . Renal biopsy      Family History:  Family History  Problem Relation Age of Onset  . Hypertension Mother   . Diabetes Father    Social History:  reports that she has never smoked. She has never used smokeless tobacco. She reports that she does not drink alcohol or use illicit drugs.  Allergies: No Known Allergies  Home medications: Prior to Admission medications   Medication Sig Start Date End Date Taking? Authorizing Provider  amLODipine (NORVASC) 10 MG tablet Take 10 mg by mouth daily.   Yes Historical Provider, MD  carvedilol (COREG) 25 MG tablet Take 25 mg by mouth 2 (two) times daily with a meal.   Yes Historical Provider, MD  clonazePAM (KLONOPIN) 0.5 MG tablet Take 0.5 mg by mouth 2 (two) times daily.   Yes Historical Provider, MD  darbepoetin (ARANESP) 60 MCG/0.3ML SOLN Inject 60 mcg into the skin every Wednesday at 6 PM.   Yes Historical Provider, MD  divalproex (DEPAKOTE) 250 MG DR tablet Take 750 mg by mouth 3 (three) times daily.   Yes Historical Provider, MD  ferrous sulfate 325 (65 FE) MG tablet Take 325 mg by mouth 3 (three) times daily.   Yes Historical Provider, MD  furosemide (LASIX) 80 MG tablet Take 1 tablet (80 mg total) by mouth daily. 09/24/11  Yes Ripudeep Jenna Luo, MD  losartan (COZAAR) 100 MG tablet Take 1 tablet (100 mg total) by mouth daily. 09/24/11 09/23/12 Yes Ripudeep Jenna Luo, MD  sodium bicarbonate 650 MG tablet Take 1 tablet (650 mg total) by mouth 3 (three) times daily. 09/24/11 09/23/12 Yes Ripudeep Jenna Luo, MD  vancomycin (VANCOCIN) 50 mg/mL oral solution Take 2.5 mLs (125 mg total) by mouth 2 (two) times daily. For another 3 days, then taper to 125 mg daily x 7 days, then taper to 125mg  every other day x 7days, then taper to 125mg  every 3 days for 7 days, then OFF 09/24/11  Yes Ripudeep Jenna Luo, MD    Inpatient medications:    . acetaminophen  650 mg Oral Once  . amLODipine  10 mg Oral Daily  . antiseptic oral rinse  15 mL Mouth Rinse q12n4p  . carvedilol  25 mg Oral BID WC  .  chlorhexidine  15  mL Mouth Rinse BID  . clonazePAM  0.5 mg Oral BID  . darbepoetin  60 mcg Subcutaneous Q Wed-1800  . divalproex  750 mg Oral TID  . ferrous sulfate  325 mg Oral TID  . heparin  5,000 Units Subcutaneous Q8H  . potassium chloride  40 mEq Oral Once  . sodium bicarbonate  650 mg Oral TID  . sodium chloride  1,000 mL Intravenous Once  . sodium chloride  3 mL Intravenous Q12H  . vancomycin  125 mg Oral QID  . DISCONTD: divalproex  750 mg Oral TID  . DISCONTD: potassium chloride  40 mEq Oral BID  . DISCONTD: vancomycin  125 mg Oral BID    Review of Systems Gen:  Denies headache, fever, chills, sweats.  No weight loss. HEENT:  No visual change, sore throat, difficulty swallowing. Resp:  No difficulty breathing, DOE.  No cough or hemoptysis. Cardiac:  No chest pain, orthopnea, PND.  Denies edema. GI:   Denies abdominal pain.   No nausea, vomiting, diarrhea.  No constipation. GU:  Denies difficulty or change in voiding.  No change in urine color.     MS:  Denies joint pain or swelling.   Derm:  Denies skin rash or itching.  No chronic skin conditions.  Neuro:   Denies focal weakness, memory problems, hx stroke or TIA.   Psych:  Denies symptoms of depression of anxiety.  No hallucination.    Labs: Basic Metabolic Panel:  Lab 10/10/11 4098 10/10/11 0500 10/09/11 1918  NA 142 143 146*  K 3.2* 3.2* 3.6  CL 101 100 99  CO2 25 23 26   GLUCOSE 89 82 88  BUN 52* 52* 51*  CREATININE 3.69* 3.91* 3.93*  ALB -- -- --  CALCIUM 7.5* 7.6* 8.2*  PHOS -- -- --   Liver Function Tests:  Lab 10/09/11 1918  AST 22  ALT <5  ALKPHOS 37*  BILITOT 0.1*  PROT 6.5  ALBUMIN 2.1*   No results found for this basename: LIPASE:3,AMYLASE:3 in the last 168 hours No results found for this basename: AMMONIA:3 in the last 168 hours CBC:  Lab 10/10/11 1426 10/10/11 0930 10/09/11 1918  WBC 11.4* 12.2* 9.3  NEUTROABS -- -- 7.0  HGB 9.4* 7.7* 9.5*  HCT 28.0* 23.4* 28.0*  MCV 95.2 95.9 95.6   PLT 124* 129* 157   PT/INR: @labrcntip (inr:5) Cardiac Enzymes: No results found for this basename: CKTOTAL:5,CKMB:5,CKMBINDEX:5,TROPONINI:5 in the last 168 hours CBG: No results found for this basename: GLUCAP:5 in the last 168 hours  Iron Studies: No results found for this basename: IRON:30,TIBC:30,TRANSFERRIN:30,FERRITIN:30 in the last 168 hours  Xrays/Other Studies: Ct Abdomen Pelvis Wo Contrast  10/10/2011  Avril L Hines     10/10/2011  9:18 AM  Pt is a 21yo with PMH significant for anoxic brain injury,  seizures, cardiomyopathy, CKD IV, HTN, and recurrent C. Diff who  presents with diarrhea, n/v for several days. Pt's mother at  bedside, assists with interview. Pt with diarrhea described as  "soft but formed" with foul odor started 5-6 days ago. Pt was  recently admitted for similar symptoms (discharged 7/4). Pt saw  Dr. Mayford Knife (primary doctor) on Monday and Dr. Caryn Section (Renal) on  Thursday and was told then to see GI doctor on Friday (today),  drink gatorade and stay hydrated, etc. Vomiting started Monday,  continues today. Saw Dr. Bosie Clos (GI), who suggested she come to  the ED. Pt has been on oral vancomycin for C. Diff which was  started during admission in June; pt has been on Flagyl as well  but not currently. Pt's mother states she has been on no other  abx and "we've never heard a reason for why she has C. Diff."    Per mother, pt has been sleeping more than usual, hiccuping, with  generally poor PO intake. N/V as above. Denies any bloody stools.   Otherwise has no complaints.   Pt has a history of anoxic brain injury; she had a  cardiopulmonary arrest in January of this year. Prior to that,  she was healthy and per mother she had no unusual OB history.  Kidney disease, recurrent C. Diff, etc has all been new since the  beginning of this year.    Past Medical History   Diagnosis  Date   .  Pneumonia         'walking pneumonia'   .  Seizures     .  Anemia     .  Heart attack     .   Cardiomyopathy     .  Heart failure     .  CHF (congestive heart failure)     .  Ileitis     .  Chronic kidney disease     .  Renal disorder     .  MPGN (membranoproliferative glomerulonephritis), type 2     .  Hypertension     .  Brain injury           Past Surgical History   Procedure  Date   .  Renal biopsy            Physical Exam:  Blood pressure 126/88, pulse 83, temperature 98.4 F (36.9 C), temperature source Oral, resp. rate 20, height 5\' 2"  (1.575 m), weight 56.019 kg (123 lb 8 oz), last menstrual period 10/09/2011, SpO2 98.00%.  Gen: frail thin young female, eyes close, answers apporapriately, knows she is in "the hospital" Skin: no rash, cyanosis HEENT:  EOMI, sclera anicteric, throat clear Neck: JVP is 10-12 cm , no bruits or LAN Chest: lungs are clear bilat Heart: regular, no rub or gallop Abdomen: soft, + 2 ascites, no HSM Ext: 1+ edema of legs bilat, symmetric, no rash or erythema, no ulcer Neuro: alert, Ox3, no focal deficit, moderate gen weakness   Impression/Plan 1. Acute on CRF- not certain of cause. Getting IVF for possible vol depletion. However, she does have significant ascites and mild LE edema on exam.  Watch closely for signs of vol overload (hx EF 40% in Jan).  UNa not low. UA is benign, doubt active GN. Decrease IVF's to 75 cc/hr.    2. CKD stage III-IV due to C3 nephropathy- looks like she had a lot of chronic changes on biopsy, but need to get that information.  3. HTN- takes norvasc 10/d, coreg 25 bid, lasix 80/d, cozaar 100/d at home.  BP 139/88. No evidence of hypotension. Getting only norvasc and coreg here appropriately. Cont to hold diuretic and ARB for now. 4. Hx arrest with ABI 5. CM EF 40% in Jan 6. CDif by hx 7. Diarrhea- per primary.    Vinson Moselle  MD [employer] [phone] pgr    [phone] cell 10/10/2011, 3:34 PM

## 2011-10-10 NOTE — Procedures (Signed)
Pt is a 21yo with PMH significant for anoxic brain injury, seizures, cardiomyopathy, CKD IV, HTN, and recurrent C. Diff who presents with diarrhea, n/v for several days. Pt's mother at bedside, assists with interview. Pt with diarrhea described as "soft but formed" with foul odor started 5-6 days ago. Pt was recently admitted for similar symptoms (discharged 7/4). Pt saw Dr. Mayford Knife (primary doctor) on Monday and Dr. Caryn Section (Renal) on Thursday and was told then to see GI doctor on Friday (today), drink gatorade and stay hydrated, etc. Vomiting started Monday, continues today. Saw Dr. Bosie Clos (GI), who suggested she come to the ED. Pt has been on oral vancomycin for C. Diff which was started during admission in June; pt has been on Flagyl as well but not currently. Pt's mother states she has been on no other abx and "we've never heard a reason for why she has C. Diff."    Per mother, pt has been sleeping more than usual, hiccuping, with generally poor PO intake. N/V as above. Denies any bloody stools.  Otherwise has no complaints.   Pt has a history of anoxic brain injury; she had a cardiopulmonary arrest in January of this year. Prior to that, she was healthy and per mother she had no unusual OB history. Kidney disease, recurrent C. Diff, etc has all been new since the beginning of this year.    Past Medical History   Diagnosis  Date   .  Pneumonia         'walking pneumonia'   .  Seizures     .  Anemia     .  Heart attack     .  Cardiomyopathy     .  Heart failure     .  CHF (congestive heart failure)     .  Ileitis     .  Chronic kidney disease     .  Renal disorder     .  MPGN (membranoproliferative glomerulonephritis), type 2     .  Hypertension     .  Brain injury           Past Surgical History   Procedure  Date   .  Renal biopsy

## 2011-10-10 NOTE — ED Notes (Signed)
Admission MD at bedside, will transport patient upon assessment completion

## 2011-10-10 NOTE — H&P (Signed)
Heather Sims is an 21 y.o. female.    Chief Complaint: diarrhea, nausea/vomiting  HPI: Pt is a 21yo with PMH significant for anoxic brain injury in January secondary to cardio-respiratory failure from pulmonary edema secondary to acute renal failure from C3 Nephritis, hx seizures, cardiomyopathy, CKD IV, HTN, and recurrent C. Diff who presents with diarrhea, n/v for several days. Pt's mother at bedside, assists with interview. Pt with diarrhea described as "soft but formed" with foul odor started 5-6 days ago. Pt was recently admitted for similar symptoms (discharged 7/4). Pt saw Dr. Mayford Knife (primary doctor) on Monday and Dr. Caryn Section (Renal) on Thursday and was told then to see GI doctor on Friday (today), drink gatorade and stay hydrated, etc. Vomiting started Monday, continues today. Saw Dr. Bosie Clos (GI), who suggested she come to the ED. Pt has been on oral vancomycin for C. Diff which was started during admission in June; pt has been on Flagyl as well but not currently. Pt's mother states she has been on no other abx and "we've never heard a reason for why she has C. Diff."   Per mother, pt has been sleeping more than usual, hiccuping, with generally poor PO intake. N/V as above. Denies any bloody stools.  Otherwise has no complaints.  Pt has a history of anoxic brain injury; she had a cardiopulmonary arrest in January of this year. Prior to that, she was healthy and per mother she had no unusual OB history. Kidney disease, recurrent C. Diff, etc has all been new since the beginning of this year.  Past Medical History  Diagnosis Date  . Pneumonia     'walking pneumonia'  . Seizures   . Anemia   . Heart attack   . Cardiomyopathy   . Heart failure   . CHF (congestive heart failure)   . Ileitis   . Chronic kidney disease   . Renal disorder   . MPGN (membranoproliferative glomerulonephritis), type 2   . Hypertension   . Brain injury     Past Surgical History  Procedure Date  . Renal  biopsy     Family History  Problem Relation Age of Onset  . Hypertension Mother   . Diabetes Father    Social History:  reports that she has never smoked. She has never used smokeless tobacco. She reports that she does not drink alcohol or use illicit drugs. Pt lives with mother and brother at home. She moved back home after her arrest and subsequent needs due to sequelae of cardiopulmonary arrest requiring respirator therapy.  Allergies: No Known Allergies   (Not in a hospital admission)  Results for orders placed during the hospital encounter of 10/09/11 (from the past 48 hour(s))  CBC WITH DIFFERENTIAL     Status: Abnormal   Collection Time   10/09/11  7:18 PM      Component Value Range Comment   WBC 9.3  4.0 - 10.5 K/uL    RBC 2.93 (*) 3.87 - 5.11 MIL/uL    Hemoglobin 9.5 (*) 12.0 - 15.0 g/dL    HCT 40.9 (*) 81.1 - 46.0 %    MCV 95.6  78.0 - 100.0 fL    MCH 32.4  26.0 - 34.0 pg    MCHC 33.9  30.0 - 36.0 g/dL    RDW 91.4 (*) 78.2 - 15.5 %    Platelets 157  150 - 400 K/uL    Neutrophils Relative 75  43 - 77 %    Neutro Abs 7.0  1.7 - 7.7 K/uL    Lymphocytes Relative 11 (*) 12 - 46 %    Lymphs Abs 1.0  0.7 - 4.0 K/uL    Monocytes Relative 13 (*) 3 - 12 %    Monocytes Absolute 1.2 (*) 0.1 - 1.0 K/uL    Eosinophils Relative 1  0 - 5 %    Eosinophils Absolute 0.1  0.0 - 0.7 K/uL    Basophils Relative 0  0 - 1 %    Basophils Absolute 0.0  0.0 - 0.1 K/uL   COMPREHENSIVE METABOLIC PANEL     Status: Abnormal   Collection Time   10/09/11  7:18 PM      Component Value Range Comment   Sodium 146 (*) 135 - 145 mEq/L    Potassium 3.6  3.5 - 5.1 mEq/L    Chloride 99  96 - 112 mEq/L    CO2 26  19 - 32 mEq/L    Glucose, Bld 88  70 - 99 mg/dL    BUN 51 (*) 6 - 23 mg/dL    Creatinine, Ser 1.61 (*) 0.50 - 1.10 mg/dL    Calcium 8.2 (*) 8.4 - 10.5 mg/dL    Total Protein 6.5  6.0 - 8.3 g/dL    Albumin 2.1 (*) 3.5 - 5.2 g/dL    AST 22  0 - 37 U/L    ALT <5  0 - 35 U/L REPEATED TO  VERIFY   Alkaline Phosphatase 37 (*) 39 - 117 U/L    Total Bilirubin 0.1 (*) 0.3 - 1.2 mg/dL    GFR calc non Af Amer 15 (*) >90 mL/min    GFR calc Af Amer 18 (*) >90 mL/min    No results found.  Review of Systems  Unable to perform ROS: patient nonverbal  Otherwise see HPI; primary history provider is mother.  Blood pressure 119/84, pulse 91, temperature 100.4 F (38 C), temperature source Oral, resp. rate 18, last menstrual period 10/09/2011, SpO2 96.00%. Physical Exam  Constitutional: No distress.       Lying in bed, minimally responsive to questions but not combative  HENT:       Eyes slightly sunken, mucous membranes dry  Cardiovascular: Normal rate, regular rhythm and normal heart sounds.   No murmur heard. Respiratory: Effort normal. No respiratory distress. She has no wheezes.  GI: She exhibits distension. There is tenderness.       Pt did not vocalize pain on palpation but did have some muscle contraction. Markedly distended but soft. Bowel sounds present, but hypoactive.  Neurological:       Unable to assess secondary to pt status, chronic (h/x anoxic brain damage) and acute (decreased responsiveness per mother). Occasional myoclonic spasms in extremities, which are baseline for pt, per mother.  Skin: Skin is warm and dry. No rash noted. She is not diaphoretic. No erythema.     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 Diff who presents with nausea/vomiting and diarrhea for approx one week, with frank dehydration. Admitting on 7/20.  1. Diarrhea with N/V - Pt with history of recurrent C.diff with recent admission for very similar symptoms as recently as 2 weeks ago. Pt on oral vancomycin at time of discharge, previously had been treated with Flagyl. Seen by Dr. Bosie Clos in clinic today, instructed to come to the ED. N/V seems less extensive than previous  days, at time  of admission. Abdomen distended, more than usual per pt's mother. PLAN - Continue oral vancomycin for now per home dosing. C.diff PCR of stool. Hemoccult stools. Zofran PRN. Given fevers, known C.diff, distended abdomen, will obtain CT abd/pelvis. Will consider GI consult.  2. Dehydration - Most likely secondary to diarrhea/recurrent C.diff with nausea and vomiting, as described above. Also a component of poor PO intake 2/2 to mental status. PLAN - IVF, encourage PO intake once N/V under better control. Monitor clinically.  3. AMS/anoxic brain injury - Pt with cardiorespiratory arrest in January 2013 requiring intubation and ventilator therapy. Per mother, multiple other chronic medical conditions have developed and/or have been being managed since that time. Pt is less responsive than baseline at time of admission. PLAN - Rehydrate/treat for C.diff, blood cx as above. Monitor for s/s of sepsis or other infection, seizures.  4. CKD IV - Followed by Dr. Caryn Section, pt with history of membranoproliferative glomerulonephritis. Now with acute on chronic renal insufficiency. Elevated Cr on admission at 3.9, likely pre-renal due to dehydration/poor PO intake/etc. PLAN - IVF, holding home losartan. Will check FENa, renal labs in AM. Will also consult Renal service in the AM.  5. HTN - Per pt history. Some episodes of hypertension during previous admission. Appears well controlled on admission, this time. PLAN - Continue home meds; home losartan not started at admission due to elevated Cr, evidence of acute on chronic renal failure.  6. Seizure disorder - Per pt history. Continue home depakote.  Street, Christopher 10/10/2011, 1:13 AM   Patient seen and examined by myself and Dr. Casper Harrison.  Available data reviewed.  Agree with findings, assessment, and plan as outlined by Dr Casper Harrison.  My additional findings are documented above.  Ivy [employer], DO 10/10/2011 2:32 AM

## 2011-10-10 NOTE — H&P (Signed)
Seen and examined.  Agree with Dr. Tye Savoy.  Briefly, 21 yo female with multiple sequelli from prolonged cardiac arrest including anoxic brain injury and cardiomyopathy.  Also has chronic kidney disease stage 3 with acute on chronic renal failure.  If that were not enough, she has been battling C diff colitis.  Our investigation reveals the acute renal failure perhaps with a post renal cause (neurogenic bladder with post void residual of at least 1000 cc)  Fluid status is also tough to determine with poor PO intake and increased losses from the C diff but also CHF.  Fortunately, her altered mental status is much clearer today (At her baseline?)  We will follow this complicated patient and adjust as clinically indicated.

## 2011-10-11 ENCOUNTER — Inpatient Hospital Stay (HOSPITAL_COMMUNITY): Payer: Medicaid Other

## 2011-10-11 LAB — URINE CULTURE: Culture: NO GROWTH

## 2011-10-11 LAB — RENAL FUNCTION PANEL
Albumin: 1.4 g/dL — ABNORMAL LOW (ref 3.5–5.2)
CO2: 24 mEq/L (ref 19–32)
Chloride: 102 mEq/L (ref 96–112)
Creatinine, Ser: 3.43 mg/dL — ABNORMAL HIGH (ref 0.50–1.10)
GFR calc Af Amer: 21 mL/min — ABNORMAL LOW (ref >90)
GFR calc non Af Amer: 18 mL/min — ABNORMAL LOW (ref >90)
Potassium: 3.1 mEq/L — ABNORMAL LOW (ref 3.5–5.1)
Sodium: 141 mEq/L (ref 135–145)

## 2011-10-11 MED ORDER — ENSURE PUDDING PO PUDG
1.0000 | Freq: Three times a day (TID) | ORAL | Status: DC
  Administered 2011-10-12: 1 via ORAL

## 2011-10-11 MED ORDER — HEPARIN SODIUM (PORCINE) 1000 UNIT/ML DIALYSIS
20.0000 [IU]/kg | INTRAMUSCULAR | Status: DC | PRN
  Filled 2011-10-11: qty 2

## 2011-10-11 MED ORDER — FUROSEMIDE 10 MG/ML IJ SOLN
160.0000 mg | Freq: Three times a day (TID) | INTRAVENOUS | Status: DC
  Administered 2011-10-11 – 2011-10-14 (×10): 160 mg via INTRAVENOUS
  Filled 2011-10-11 (×16): qty 16

## 2011-10-11 MED ORDER — POTASSIUM CHLORIDE CRYS ER 20 MEQ PO TBCR
20.0000 meq | EXTENDED_RELEASE_TABLET | Freq: Once | ORAL | Status: AC
  Administered 2011-10-11: 20 meq via ORAL
  Filled 2011-10-11: qty 1

## 2011-10-11 NOTE — Progress Notes (Signed)
Seen and examined.  Discussed with both renal and Dr. Louanne Belton.  Unfortunately, her renal function did not improve significantly with the foley (neurogenic bladder) and hydration.  Renal believes this may be progression of her disease and that she may be headed toward dialysis.  We will also repeat her echo to see if she really has systolic dysfunction CHF or whether the echo back in January with EF=35-45% was due to acute injury (stunned myocardium.)    It seems her diarrhea has improved and her C diff is neg.

## 2011-10-11 NOTE — Progress Notes (Signed)
Family Medicine Teaching Service Daily Progress Note Service Page: [phone]  Patient Assessment: 21yo F with recent anoxic brain injury and c.diff who presents with diarrhea, dehydration, and acute on chronic renal failure.  Subjective: Doing better this morning.  Mother feels is looking better and is more hungry.  CT scan of abd/pelvis ordered for yesterday was not done for multiple reasons.  Objective: Temp:  [97.5 F (36.4 C)-98.7 F (37.1 C)] 98.7 F (37.1 C) (07/21 0702) Pulse Rate:  [76-84] 84  (07/21 0702) Resp:  [18-20] 18  (07/21 0702) BP: (103-126)/(74-89) 126/89 mmHg (07/21 0702) SpO2:  [93 %-98 %] 93 % (07/21 0702) Weight:  [132 lb 4.4 oz (60 kg)] 132 lb 4.4 oz (60 kg) (07/21 1914) Exam: General: NAD, lying in bed, answers questions appropriately Cardiovascular: RRR, 2/6 systolic murmur Respiratory: CTABL Abdomen: Distended but non-tender.  BS present Extremities: No edema, 2+ pulses.  I have reviewed the patient's medications, labs, imaging, and diagnostic testing.  Notable results are summarized below.  CBC BMET   Lab 10/10/11 1426 10/10/11 0930 10/09/11 1918  WBC 11.4* 12.2* 9.3  HGB 9.4* 7.7* 9.5*  HCT 28.0* 23.4* 28.0*  PLT 124* 129* 157    Lab 10/11/11 0535 10/10/11 1205 10/10/11 0500  NA 141 142 143  K 3.1* 3.2* 3.2*  CL 102 101 100  CO2 24 25 23   BUN 50* 52* 52*  CREATININE 3.43* 3.69* 3.91*  GLUCOSE 83 89 82  CALCIUM 7.4* 7.5* 7.6*     Imaging/Diagnostic Tests: None  Plan: 1. Diarrhea: Still with loose stools but C.Diff PCR negative. 1. Vanc taper has been restarted from highest dose.  Plan for standard taper at this point. 2. With patient afebrile and no abdominal pain at this time, will hold off on CT abd/pelvis but will obtain abd x-ray to eval for any obstruction 3. Advance diet gradually today 2. Altered mental status: This appears completely resolved today.  Unclear on etiology. 3. CKD with AKI: Gradually resolving.  Unclear what  baseline will be following this acute insult. 1. Will continue gentle IV hydration and recheck BMET tomorrow 2. Appreciate renal input.  Depending on how her renal function does over the next day or two we may need to consider vein mapping in the near future. 4. HTN: normotensive overnight.  On home meds. 5. Seizure Disorder: On Depakote.  No seizures at this time. 6. Cardiomyopathy: Not an active issue but does have history of acute systolic heart failure.  This was in the setting of being acutely post-arrest.  Is not in heart failure at this time.  Will obtain echo during this admission to determine function. 7. Protein Calorie Malnutrition: Will advance diet today and will likely add ensure shakes as tolerated. 8. Hypocalcemia/hyperphosphatemia: Management per renal but may benefit from binders in the near future. 9. Hyponatremia: Will give KCL orally today.  Will likely need more but is asymptomatic and with changing renal function do not wish to cause hyperkalemia. 10. FEN/GI: ADAT 11. PPx: SHQ 12. Dispo: floor status, will need to further monitor renal status before ready for d/c  Rolf Fells, MD 10/11/2011, 8:31 AM

## 2011-10-11 NOTE — Progress Notes (Signed)
Subjective: No complaints, still having loose stools. Listless, not speaking much. R -handed.   Objective Vital signs in last 24 hours: Filed Vitals:   10/10/11 1812 10/10/11 2225 10/11/11 0238 10/11/11 0702  BP: 105/74 114/82 103/75 126/89  Pulse: 76 79 78 84  Temp: 97.8 F (36.6 C) 98.1 F (36.7 C) 97.5 F (36.4 C) 98.7 F (37.1 C)  TempSrc: Oral Oral Oral Oral  Resp: 20 18 18 18   Height:      Weight:    60 kg (132 lb 4.4 oz)  SpO2: 97% 93% 96% 93%   Weight change:   Intake/Output Summary (Last 24 hours) at 10/11/11 1126 Last data filed at 10/11/11 0711  Gross per 24 hour  Intake 1716.67 ml  Output    400 ml  Net 1316.67 ml   Labs: Basic Metabolic Panel:  Lab 10/11/11 7829 10/10/11 1205 10/10/11 0500 10/09/11 1918  NA 141 142 143 146*  K 3.1* 3.2* 3.2* 3.6  CL 102 101 100 99  CO2 24 25 23 26   GLUCOSE 83 89 82 88  BUN 50* 52* 52* 51*  CREATININE 3.43* 3.69* 3.91* 3.93*  ALB -- -- -- --  CALCIUM 7.4* 7.5* 7.6* 8.2*  PHOS 6.6* -- -- --   Liver Function Tests:  Lab 10/11/11 0535 10/09/11 1918  AST -- 22  ALT -- <5  ALKPHOS -- 37*  BILITOT -- 0.1*  PROT -- 6.5  ALBUMIN 1.4* 2.1*   No results found for this basename: LIPASE:3,AMYLASE:3 in the last 168 hours No results found for this basename: AMMONIA:3 in the last 168 hours CBC:  Lab 10/10/11 1426 10/10/11 0930 10/09/11 1918  WBC 11.4* 12.2* 9.3  NEUTROABS -- -- 7.0  HGB 9.4* 7.7* 9.5*  HCT 28.0* 23.4* 28.0*  MCV 95.2 95.9 95.6  PLT 124* 129* 157   PT/INR: @labrcntip (inr:5) Cardiac Enzymes: No results found for this basename: CKTOTAL:5,CKMB:5,CKMBINDEX:5,TROPONINI:5 in the last 168 hours CBG: No results found for this basename: GLUCAP:5 in the last 168 hours  Iron Studies: No results found for this basename: IRON:30,TIBC:30,TRANSFERRIN:30,FERRITIN:30 in the last 168 hours  Physical Exam:  Blood pressure 126/89, pulse 84, temperature 98.7 F (37.1 C), temperature source Oral, resp. rate 18, height  5\' 2"  (1.575 m), weight 60 kg (132 lb 4.4 oz), last menstrual period 10/09/2011, SpO2 93.00%.  Gen: frail thin young female, eyes close, answers apporapriately, knows she is in "the hospital"  Skin: no rash, cyanosis, +periorbital edema HEENT: EOMI, sclera anicteric, throat clear  Neck: JVP is 10-12 cm , no bruits or LAN  Chest: decreased L base o/w clear  Heart: regular, no rub or gallop  Abdomen: soft, 3+ ascites today, worse than yesterday Ext: 1+ edema of legs bilat, symmetric, no rash or erythema, no ulcer  Neuro: alert, Ox3, no focal deficit, moderate gen weakness, listless and fatigued, responds verbally, lethargic  Impression/Plan  1. 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 Clincally this looks like progression of CKD, pt vol overloaded and making little urine. She could have worsening of CM causing cardiorenal syndrome w loss of function, or progression of underlying glomerular disease. I think that a lot of her symptoms may be due to uremia, she is extremely weak and having lots of nausea and diarrhea.  She has poor muscle mass and creat of 3.5 likely overestimating her true renal function. Not sure that 24 hr urine creat clearance would help much, am leaning towards going ahead  with dialysis.  I will talk with mother and if agreeable ask IR to place tunneled HD cath tomorrow and start HD tomorrow. In meantime will stop fluids, start highdose IV lasix to try and get some fluid off. Renal diet, fluid restrict.  2. Ascites- this could be all due to vol overload but is worse and more localized than usually seen w ESRD alone. Other possibilities- liver disease/portal HTN ?, ?  worsening of cardiomyopathy. ECHO ordered by primary svc. 3. HTN- takes norvasc 10/d, coreg 25 bid, lasix 80/d, cozaar 100/d at home. BP 139/88. No evidence of hypotension. Getting norvasc and coreg here and BP controlled.  4. Hx arrest with ABI 5. CM EF 40% in  Jan 6. CDif- recent admission 7. Diarrhea- per primary. Cdif negative.   Vinson Moselle  MD [facility] [phone] pgr    [phone] cell 10/11/2011, 11:26 AM

## 2011-10-12 ENCOUNTER — Inpatient Hospital Stay (HOSPITAL_COMMUNITY): Payer: Medicaid Other

## 2011-10-12 ENCOUNTER — Encounter (HOSPITAL_COMMUNITY): Payer: Self-pay | Admitting: Radiology

## 2011-10-12 DIAGNOSIS — N19 Unspecified kidney failure: Secondary | ICD-10-CM

## 2011-10-12 LAB — CBC
Hemoglobin: 7.9 g/dL — ABNORMAL LOW (ref 12.0–15.0)
MCHC: 33.5 g/dL (ref 30.0–36.0)
RBC: 2.5 MIL/uL — ABNORMAL LOW (ref 3.87–5.11)
WBC: 9.6 10*3/uL (ref 4.0–10.5)

## 2011-10-12 LAB — RENAL FUNCTION PANEL
CO2: 23 mEq/L (ref 19–32)
Calcium: 7.8 mg/dL — ABNORMAL LOW (ref 8.4–10.5)
Chloride: 102 mEq/L (ref 96–112)
GFR calc Af Amer: 24 mL/min — ABNORMAL LOW (ref >90)
GFR calc non Af Amer: 21 mL/min — ABNORMAL LOW (ref >90)
Potassium: 3.1 mEq/L — ABNORMAL LOW (ref 3.5–5.1)
Sodium: 140 mEq/L (ref 135–145)

## 2011-10-12 LAB — PROTIME-INR
INR: 1.33 (ref 0.00–1.49)
Prothrombin Time: 16.7 seconds — ABNORMAL HIGH (ref 11.6–15.2)

## 2011-10-12 MED ORDER — IOHEXOL 300 MG/ML  SOLN
50.0000 mL | Freq: Once | INTRAMUSCULAR | Status: AC | PRN
  Administered 2011-10-12: 10 mL via INTRAVENOUS

## 2011-10-12 MED ORDER — POTASSIUM CHLORIDE CRYS ER 20 MEQ PO TBCR
20.0000 meq | EXTENDED_RELEASE_TABLET | Freq: Once | ORAL | Status: AC
  Administered 2011-10-12: 20 meq via ORAL
  Filled 2011-10-12: qty 1

## 2011-10-12 MED ORDER — FENTANYL CITRATE 0.05 MG/ML IJ SOLN
INTRAMUSCULAR | Status: AC
  Filled 2011-10-12: qty 2

## 2011-10-12 MED ORDER — MIDAZOLAM HCL 5 MG/5ML IJ SOLN
INTRAMUSCULAR | Status: AC | PRN
  Administered 2011-10-12: 2 mg via INTRAVENOUS

## 2011-10-12 MED ORDER — CEFAZOLIN SODIUM 1-5 GM-% IV SOLN
1.0000 g | Freq: Once | INTRAVENOUS | Status: AC
  Administered 2011-10-12: 1 g via INTRAVENOUS
  Filled 2011-10-12 (×2): qty 50

## 2011-10-12 MED ORDER — FENTANYL CITRATE 0.05 MG/ML IJ SOLN
INTRAMUSCULAR | Status: AC | PRN
  Administered 2011-10-12: 25 ug via INTRAVENOUS

## 2011-10-12 NOTE — Progress Notes (Signed)
Family Medicine Teaching Service Daily Progress Note Service Page: [phone]  Subjective: Pt sleeping while entering room.  Talked to mom who is concerned about her diarrhea, but is the same as it was upon presentation.  Still on Vanc 125 mg qid PO for this and will continue to follow.  Dr. Arlean Hopping recommended Cath placement for HD inpt before following up outpt.  Tx plan is for her to go for cath today and HD tomorrow.  Objective: Temp:  [99 F (37.2 C)] 99 F (37.2 C) (07/22 0523) Pulse Rate:  [85] 85  (07/22 0523) Resp:  [18] 18  (07/22 0523) BP: (125)/(95) 125/95 mmHg (07/22 0523) SpO2:  [98 %] 98 % (07/22 0523) Weight:  [131 lb 13.4 oz (59.8 kg)] 131 lb 13.4 oz (59.8 kg) (07/22 0523) Exam: General: NAD, lying in bed, answers questions appropriately Cardiovascular: RRR, 2/6 systolic murmur Respiratory: CTABL Abdomen: Distended but non-tender.  BS present Extremities: No edema, 2+ pulses.  I have reviewed the patient's medications, labs, imaging, and diagnostic testing.  Notable results are summarized below.  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 Diff who presents with nausea/vomiting and diarrhea for approx one week, with frank dehydration.    1. Diarrhea: Still with loose stools but C.Diff PCR negative. 1. Vanc taper has been restarted from highest dose.  Plan for standard taper at this point. 2. With patient afebrile and no abdominal pain at  this time, will hold off on CT abd/pelvis but will obtain abd x-ray to eval for any obstruction 3. Advance diet gradually  2. Altered mental status: This appears completely resolved today.  Unclear on etiology as renal believes this is secondary to her renal fxn causing uremia. 3. 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 1. Pt is volume overloaded with poor UOP.  Started on IV lasix per renal 2. Appreciate renal input.  Going for IR Cath today so she can receive HD tomorrow and will most likely need long term dialysis in the future. 3. Will continue to follow renal recs 4. HTN: normotensive overnight.  On home meds. 5. Seizure Disorder: On Depakote.  No seizures at this time. 6. Cardiomyopathy: Not an active issue but does have history of acute systolic heart failure.  This was in the setting of being acutely post-arrest.  Is not in heart failure at this time.  ECHO ordered on 7/21 to reassess EF as could be contributing to her renal fxn from cardiorenal synrome. 7. Protein Calorie Malnutrition: Will advance diet and will likely add ensure shakes as tolerated. 8. Hypocalcemia/hyperphosphatemia: Management per renal but may benefit from binders in the near future. 9. Hypokalemia: Reordered 20 meq KCl today as will be careful with this management due to her kidney fxn 10. FEN/GI: ADAT 11. PPx: SHQ 12. Dispo: floor status, will  need to further monitor renal status before ready for d/c  Gildardo Cranker, DO 10/12/2011, 7:24 AM

## 2011-10-12 NOTE — Progress Notes (Signed)
FMTS Attending Daily Note: Heather Levy MD [phone] pager office [phone] I  have seen and examined this patient, reviewed their chart. I have discussed this patient with the resident. I agree with the resident's findings, assessment and care plan. A very unfortunate woman with anoxic brain injury and with worsening renal failure as above. She has received her tunneled catheter placement and ECHO is pending. I have discussed her hospitalization with her Mother at bedside. Her only question is about the C. Diff treatment ---unclear to me that she currently has C . Diff as culture was negative. Mom quite worried about this so I see no reason to abruptly stop the oral vancomycin today.. We did agree that if diarrhea continues, we may need to re-evaluate--possibly repeat culture, seek other sources.

## 2011-10-12 NOTE — Progress Notes (Signed)
VASCULAR LAB PRELIMINARY  PRELIMINARY  PRELIMINARY  PRELIMINARY  Right  Upper Extremity Vein Map    Cephalic  Patent with mild wall thickening.  Moderate wall thickening in the [facility]  Segment Diameter Depth Comment  1. Axilla 1.43mm mm   2. Mid upper arm 0.13mm mm   3. Above AC mm mm   4. In AC mm mm   5. Below AC mm mm   6. Mid forearm 1.1mm 1.45mm mm  small branches in forearm  7. Wrist mm mm    mm mm    mm mm    mm mm    Basilic  Patent with mild wall thickening  Segment Diameter Depth Comment  1. Axilla mm mm   2. Mid upper arm mm mm   3. Above AC 2.36mm 0.71mm  low origin  4. In [facility] 2.91mm 0.810mm   5. Below AC 1.39mm 4.19mm   6. Mid forearm mm mm   7. Wrist mm mm    mm mm    mm mm    mm mm    Left Upper Extremity Vein Map    Cephalic  Patent with no significant wall thickening  Segment Diameter Depth Comment  1. Axilla 1.3mm mm   2. Mid upper arm 1.61mm mm  branch  3. Above AC 1.2mm mm   4. In AC mm mm   5. Below AC mm mm   6. Mid forearm 0.64mm 0.59mm mm Small branches in the forearm  7. Wrist mm mm    mm mm    mm mm    mm mm    Basilic  Patent with no significant wall thickening  Segment Diameter Depth Comment  1. Axilla mm mm   2. Mid upper arm 2.19mm 7.3mm   3. Above AC 2.85mm 7.60mm   4. In AC mm mm   5. Below AC 1.89mm 4.75mm   6. Mid forearm 1.65mm 2.37mm   7. Wrist mm mm    mm mm    mm mm    mm mm     Keiandre Cygan, RVT 10/12/2011, 4:26 PM

## 2011-10-12 NOTE — Procedures (Signed)
Successful placement of right IJ approach dialysis catheter with tip within the  superior aspect of the right atrium. The catheter is ready for immediate use. No immediate post procedural complications.

## 2011-10-12 NOTE — ED Notes (Signed)
O2 d/c'd 

## 2011-10-12 NOTE — ED Notes (Signed)
Transporters here to take pt back to her room

## 2011-10-12 NOTE — H&P (Signed)
Heather Sims is an 21 y.o. female.   Chief Complaint: CKD; Renal failure: nephropathy Worsening renal fxn Scheduled for dialysis catheter placement in IR HPI: Sz; MI/cardiomyopathy; CHF; HTN; GMN; anoxic brain injury - cardio-resp failure  Past Medical History  Diagnosis Date  . Pneumonia     'walking pneumonia'  . Seizures   . Anemia   . Heart attack   . Cardiomyopathy   . Heart failure   . CHF (congestive heart failure)   . Ileitis   . Chronic kidney disease   . Renal disorder   . MPGN (membranoproliferative glomerulonephritis), type 2   . Hypertension   . Brain injury     Past Surgical History  Procedure Date  . Renal biopsy     Family History  Problem Relation Age of Onset  . Hypertension Mother   . Diabetes Father    Social History:  reports that she has never smoked. She has never used smokeless tobacco. She reports that she does not drink alcohol or use illicit drugs.  Allergies: No Known Allergies  Medications Prior to Admission  Medication Sig Dispense Refill  . amLODipine (NORVASC) 10 MG tablet Take 10 mg by mouth daily.      . carvedilol (COREG) 25 MG tablet Take 25 mg by mouth 2 (two) times daily with a meal.      . clonazePAM (KLONOPIN) 0.5 MG tablet Take 0.5 mg by mouth 2 (two) times daily.      . darbepoetin (ARANESP) 60 MCG/0.3ML SOLN Inject 60 mcg into the skin every Wednesday at 6 PM.      . divalproex (DEPAKOTE) 250 MG DR tablet Take 750 mg by mouth 3 (three) times daily.      . ferrous sulfate 325 (65 FE) MG tablet Take 325 mg by mouth 3 (three) times daily.      . furosemide (LASIX) 80 MG tablet Take 1 tablet (80 mg total) by mouth daily.  60 tablet  3  . losartan (COZAAR) 100 MG tablet Take 1 tablet (100 mg total) by mouth daily.  60 tablet  3  . sodium bicarbonate 650 MG tablet Take 1 tablet (650 mg total) by mouth 3 (three) times daily.  90 tablet  0  . vancomycin (VANCOCIN) 50 mg/mL oral solution Take 2.5 mLs (125 mg total) by mouth 2  (two) times daily. For another 3 days, then taper to 125 mg daily x 7 days, then taper to 125mg  every other day x 7days, then taper to 125mg  every 3 days for 7 days, then OFF  30 mL  3    Results for orders placed during the hospital encounter of 10/09/11 (from the past 48 hour(s))  CBC     Status: Abnormal   Collection Time   10/10/11  9:30 AM      Component Value Range Comment   WBC 12.2 (*) 4.0 - 10.5 K/uL    RBC 2.44 (*) 3.87 - 5.11 MIL/uL    Hemoglobin 7.7 (*) 12.0 - 15.0 g/dL    HCT 16.1 (*) 09.6 - 46.0 %    MCV 95.9  78.0 - 100.0 fL    MCH 31.6  26.0 - 34.0 pg    MCHC 32.9  30.0 - 36.0 g/dL    RDW 04.5 (*) 40.9 - 15.5 %    Platelets 129 (*) 150 - 400 K/uL   BASIC METABOLIC PANEL     Status: Abnormal   Collection Time   10/10/11 12:05 PM  Component Value Range Comment   Sodium 142  135 - 145 mEq/L    Potassium 3.2 (*) 3.5 - 5.1 mEq/L    Chloride 101  96 - 112 mEq/L    CO2 25  19 - 32 mEq/L    Glucose, Bld 89  70 - 99 mg/dL    BUN 52 (*) 6 - 23 mg/dL    Creatinine, Ser 8.29 (*) 0.50 - 1.10 mg/dL    Calcium 7.5 (*) 8.4 - 10.5 mg/dL    GFR calc non Af Amer 16 (*) >90 mL/min    GFR calc Af Amer 19 (*) >90 mL/min   CLOSTRIDIUM DIFFICILE BY PCR     Status: Normal   Collection Time   10/10/11 12:06 PM      Component Value Range Comment   C difficile by pcr NEGATIVE  NEGATIVE   URINALYSIS, ROUTINE W REFLEX MICROSCOPIC     Status: Abnormal   Collection Time   10/10/11 12:07 PM      Component Value Range Comment   Color, Urine YELLOW  YELLOW    APPearance TURBID (*) CLEAR    Specific Gravity, Urine 1.017  1.005 - 1.030    pH 5.0  5.0 - 8.0    Glucose, UA NEGATIVE  NEGATIVE mg/dL    Hgb urine dipstick LARGE (*) NEGATIVE    Bilirubin Urine NEGATIVE  NEGATIVE    Ketones, ur NEGATIVE  NEGATIVE mg/dL    Protein, ur 30 (*) NEGATIVE mg/dL    Urobilinogen, UA 0.2  0.0 - 1.0 mg/dL    Nitrite NEGATIVE  NEGATIVE    Leukocytes, UA SMALL (*) NEGATIVE   SODIUM, URINE, RANDOM      Status: Normal   Collection Time   10/10/11 12:07 PM      Component Value Range Comment   Sodium, Ur 35     CREATININE, URINE, RANDOM     Status: Normal   Collection Time   10/10/11 12:07 PM      Component Value Range Comment   Creatinine, Urine 166.75     URINE MICROSCOPIC-ADD ON     Status: Abnormal   Collection Time   10/10/11 12:07 PM      Component Value Range Comment   Squamous Epithelial / LPF RARE  RARE    WBC, UA 0-2  <3 WBC/hpf    RBC / HPF 3-6  <3 RBC/hpf    Bacteria, UA FEW (*) RARE    Urine-Other MUCOUS PRESENT     URINE CULTURE     Status: Normal   Collection Time   10/10/11 12:07 PM      Component Value Range Comment   Specimen Description URINE, CATHETERIZED      Special Requests ADDED AT 1600      Culture  Setup Time 10/10/2011 20:28      Colony Count NO GROWTH      Culture NO GROWTH      Report Status 10/11/2011 FINAL     OCCULT BLOOD X 1 CARD TO LAB, STOOL     Status: Normal   Collection Time   10/10/11 12:43 PM      Component Value Range Comment   Fecal Occult Bld NEGATIVE     CBC     Status: Abnormal   Collection Time   10/10/11  2:26 PM      Component Value Range Comment   WBC 11.4 (*) 4.0 - 10.5 K/uL    RBC 2.94 (*) 3.87 - 5.11 MIL/uL  Hemoglobin 9.4 (*) 12.0 - 15.0 g/dL DELTA CHECK NOTED   HCT 28.0 (*) 36.0 - 46.0 %    MCV 95.2  78.0 - 100.0 fL    MCH 32.0  26.0 - 34.0 pg    MCHC 33.6  30.0 - 36.0 g/dL    RDW 72.5 (*) 36.6 - 15.5 %    Platelets 124 (*) 150 - 400 K/uL   RENAL FUNCTION PANEL     Status: Abnormal   Collection Time   10/11/11  5:35 AM      Component Value Range Comment   Sodium 141  135 - 145 mEq/L    Potassium 3.1 (*) 3.5 - 5.1 mEq/L    Chloride 102  96 - 112 mEq/L    CO2 24  19 - 32 mEq/L    Glucose, Bld 83  70 - 99 mg/dL    BUN 50 (*) 6 - 23 mg/dL    Creatinine, Ser 4.40 (*) 0.50 - 1.10 mg/dL    Calcium 7.4 (*) 8.4 - 10.5 mg/dL    Phosphorus 6.6 (*) 2.3 - 4.6 mg/dL    Albumin 1.4 (*) 3.5 - 5.2 g/dL    GFR calc non Af Amer  18 (*) >90 mL/min    GFR calc Af Amer 21 (*) >90 mL/min   RENAL FUNCTION PANEL     Status: Abnormal   Collection Time   10/12/11  5:35 AM      Component Value Range Comment   Sodium 140  135 - 145 mEq/L    Potassium 3.1 (*) 3.5 - 5.1 mEq/L    Chloride 102  96 - 112 mEq/L    CO2 23  19 - 32 mEq/L    Glucose, Bld 94  70 - 99 mg/dL    BUN 47 (*) 6 - 23 mg/dL    Creatinine, Ser 3.47 (*) 0.50 - 1.10 mg/dL    Calcium 7.8 (*) 8.4 - 10.5 mg/dL    Phosphorus 6.1 (*) 2.3 - 4.6 mg/dL    Albumin 1.5 (*) 3.5 - 5.2 g/dL    GFR calc non Af Amer 21 (*) >90 mL/min    GFR calc Af Amer 24 (*) >90 mL/min   CBC     Status: Abnormal   Collection Time   10/12/11  5:35 AM      Component Value Range Comment   WBC 9.6  4.0 - 10.5 K/uL    RBC 2.50 (*) 3.87 - 5.11 MIL/uL    Hemoglobin 7.9 (*) 12.0 - 15.0 g/dL    HCT 42.5 (*) 95.6 - 46.0 %    MCV 94.4  78.0 - 100.0 fL    MCH 31.6  26.0 - 34.0 pg    MCHC 33.5  30.0 - 36.0 g/dL    RDW 38.7  56.4 - 33.2 %    Platelets 135 (*) 150 - 400 K/uL    Ct Abdomen Pelvis Wo Contrast  10/10/2011  Avril L Hines     10/10/2011  9:18 AM  Pt is a 21yo with PMH significant for anoxic brain injury,  seizures, cardiomyopathy, CKD IV, HTN, and recurrent C. Diff who  presents with diarrhea, n/v for several days. Pt's mother at  bedside, assists with interview. Pt with diarrhea described as  "soft but formed" with foul odor started 5-6 days ago. Pt was  recently admitted for similar symptoms (discharged 7/4). Pt saw  Dr. Mayford Knife (primary doctor) on Monday and Dr. Caryn Section (Renal) on  Thursday and was  told then to see GI doctor on Friday (today),  drink gatorade and stay hydrated, etc. Vomiting started Monday,  continues today. Saw Dr. Bosie Clos (GI), who suggested she come to  the ED. Pt has been on oral vancomycin for C. Diff which was  started during admission in June; pt has been on Flagyl as well  but not currently. Pt's mother states she has been on no other  abx and "we've never heard a  reason for why she has C. Diff."    Per mother, pt has been sleeping more than usual, hiccuping, with  generally poor PO intake. N/V as above. Denies any bloody stools.   Otherwise has no complaints.   Pt has a history of anoxic brain injury; she had a  cardiopulmonary arrest in January of this year. Prior to that,  she was healthy and per mother she had no unusual OB history.  Kidney disease, recurrent C. Diff, etc has all been new since the  beginning of this year.    Past Medical History   Diagnosis  Date   .  Pneumonia         'walking pneumonia'   .  Seizures     .  Anemia     .  Heart attack     .  Cardiomyopathy     .  Heart failure     .  CHF (congestive heart failure)     .  Ileitis     .  Chronic kidney disease     .  Renal disorder     .  MPGN (membranoproliferative glomerulonephritis), type 2     .  Hypertension     .  Brain injury           Past Surgical History   Procedure  Date   .  Renal biopsy           Dg Abd 2 Views  10/11/2011  *RADIOLOGY REPORT*  Clinical Data: Abdominal distention.  Nausea.  ABDOMEN - 2 VIEW  Comparison: 09/19/2011  Findings: There is prominent ascites.  No dilated loops of bowel. Decubitus view demonstrates no free air.  IMPRESSION:  1.  Extensive ascites. 2.  No evidence of bowel obstruction or other acute abnormality.  Original Report Authenticated By: Gwynn Burly, M.D.    Review of Systems  Constitutional: Positive for fever, weight loss and malaise/fatigue.       Low grade 99.  Respiratory: Negative for cough.   Cardiovascular: Negative for chest pain.  Gastrointestinal: Positive for nausea, vomiting and abdominal pain.  Musculoskeletal: Positive for myalgias. Negative for falls.  Neurological: Positive for weakness.    Blood pressure 125/95, pulse 85, temperature 99 F (37.2 C), temperature source Oral, resp. rate 18, height 5\' 2"  (1.575 m), weight 131 lb 13.4 oz (59.8 kg), last menstrual period 10/09/2011, SpO2 98.00%. Physical Exam    Constitutional: She is oriented to person, place, and time.  Cardiovascular: Normal rate and regular rhythm.   No murmur heard. Respiratory: Effort normal. She has wheezes.       Few wheezes Bilat  GI: Soft. Bowel sounds are normal. She exhibits distension.       ascites  Musculoskeletal: Normal range of motion.       Weak!  Neurological: She is alert and oriented to person, place, and time.       listless  Skin: Skin is warm and dry.  Psychiatric: She has a normal mood and affect. Judgment and  thought content normal.     Assessment/Plan Renal failure; nephropathy Worsening renal fxn Scheduled for HD cath placement Pt and mother aware of procedure benefits and risks and agreeable to proceed. Consent signed and in chart Stacy Sailer A 10/12/2011, 8:59 AM

## 2011-10-12 NOTE — ED Notes (Signed)
O2 2L [place] 

## 2011-10-12 NOTE — Progress Notes (Signed)
[facility] ROUNDING NOTE   Subjective:   Interval History: stable  Objective:  Vital signs in last 24 hours:  Temp:  [99 F (37.2 C)] 99 F (37.2 C) (07/22 0523) Pulse Rate:  [85] 85  (07/22 0523) Resp:  [18] 18  (07/22 0523) BP: (125)/(95) 125/95 mmHg (07/22 0523) SpO2:  [98 %] 98 % (07/22 0523) Weight:  [59.8 kg (131 lb 13.4 oz)] 59.8 kg (131 lb 13.4 oz) (07/22 0523)  Weight change:  Filed Weights   10/10/11 0156 10/11/11 0702 10/12/11 0523  Weight: 56.019 kg (123 lb 8 oz) 60 kg (132 lb 4.4 oz) 59.8 kg (131 lb 13.4 oz)    Intake/Output: I/O last 3 completed shifts: In: -  Out: 550 [Urine:550]   Intake/Output this shift:     CVS- RRR RS- CTA ABD- distended EXT- no edema   Basic Metabolic Panel:  Lab 10/12/11 1610 10/11/11 0535 10/10/11 1205 10/10/11 0500 10/09/11 1918  NA 140 141 142 143 146*  K 3.1* 3.1* 3.2* 3.2* 3.6  CL 102 102 101 100 99  CO2 23 24 25 23 26   GLUCOSE 94 83 89 82 88  BUN 47* 50* 52* 52* 51*  CREATININE 3.00* 3.43* 3.69* 3.91* 3.93*  CALCIUM 7.8* 7.4* 7.5* -- --  MG -- -- -- -- --  PHOS 6.1* 6.6* -- -- --    Liver Function Tests:  Lab 10/12/11 0535 10/11/11 0535 10/09/11 1918  AST -- -- 22  ALT -- -- <5  ALKPHOS -- -- 37*  BILITOT -- -- 0.1*  PROT -- -- 6.5  ALBUMIN 1.5* 1.4* 2.1*   No results found for this basename: LIPASE:5,AMYLASE:5 in the last 168 hours No results found for this basename: AMMONIA:3 in the last 168 hours  CBC:  Lab 10/12/11 0535 10/10/11 1426 10/10/11 0930 10/09/11 1918  WBC 9.6 11.4* 12.2* 9.3  NEUTROABS -- -- -- 7.0  HGB 7.9* 9.4* 7.7* 9.5*  HCT 23.6* 28.0* 23.4* 28.0*  MCV 94.4 95.2 95.9 95.6  PLT 135* 124* 129* 157    Cardiac Enzymes: No results found for this basename: CKTOTAL:5,CKMB:5,CKMBINDEX:5,TROPONINI:5 in the last 168 hours  BNP: No components found with this basename: POCBNP:5  CBG: No results found for this basename: GLUCAP:5 in the last 168  hours  Microbiology: Results for orders placed during the hospital encounter of 10/09/11  CULTURE, BLOOD (ROUTINE X 2)     Status: Normal (Preliminary result)   Collection Time   10/10/11 12:05 AM      Component Value Range Status Comment   Specimen Description BLOOD HAND RIGHT   Final    Special Requests BOTTLES DRAWN AEROBIC ONLY 1CC   Final    Culture  Setup Time 10/10/2011 05:08   Final    Culture     Final    Value:        BLOOD CULTURE RECEIVED NO GROWTH TO DATE CULTURE WILL BE HELD FOR 5 DAYS BEFORE ISSUING A FINAL NEGATIVE REPORT   Report Status PENDING   Incomplete   CULTURE, BLOOD (ROUTINE X 2)     Status: Normal (Preliminary result)   Collection Time   10/10/11  1:05 AM      Component Value Range Status Comment   Specimen Description BLOOD RIGHT ARM   Final    Special Requests BOTTLES DRAWN AEROBIC ONLY 1CC   Final    Culture  Setup Time 10/10/2011 11:34   Final    Culture     Final  Value:        BLOOD CULTURE RECEIVED NO GROWTH TO DATE CULTURE WILL BE HELD FOR 5 DAYS BEFORE ISSUING A FINAL NEGATIVE REPORT   Report Status PENDING   Incomplete   CLOSTRIDIUM DIFFICILE BY PCR     Status: Normal   Collection Time   10/10/11 12:06 PM      Component Value Range Status Comment   C difficile by pcr NEGATIVE  NEGATIVE Final   URINE CULTURE     Status: Normal   Collection Time   10/10/11 12:07 PM      Component Value Range Status Comment   Specimen Description URINE, CATHETERIZED   Final    Special Requests ADDED AT 1600   Final    Culture  Setup Time 10/10/2011 20:28   Final    Colony Count NO GROWTH   Final    Culture NO GROWTH   Final    Report Status 10/11/2011 FINAL   Final     Coagulation Studies: No results found for this basename: LABPROT:5,INR:5 in the last 72 hours  Urinalysis:  Basename 10/10/11 1207  COLORURINE YELLOW  LABSPEC 1.017  PHURINE 5.0  GLUCOSEU NEGATIVE  HGBUR LARGE*  BILIRUBINUR NEGATIVE  KETONESUR NEGATIVE  PROTEINUR 30*  UROBILINOGEN  0.2  NITRITE NEGATIVE  LEUKOCYTESUR SMALL*      Imaging: Dg Abd 2 Views  10/11/2011  *RADIOLOGY REPORT*  Clinical Data: Abdominal distention.  Nausea.  ABDOMEN - 2 VIEW  Comparison: 09/19/2011  Findings: There is prominent ascites.  No dilated loops of bowel. Decubitus view demonstrates no free air.  IMPRESSION:  1.  Extensive ascites. 2.  No evidence of bowel obstruction or other acute abnormality.  Original Report Authenticated By: Gwynn Burly, M.D.     Medications:      . DISCONTD: sodium chloride 75 mL/hr at 10/10/11 2313      . amLODipine  10 mg Oral Daily  . antiseptic oral rinse  15 mL Mouth Rinse q12n4p  . carvedilol  25 mg Oral BID WC  .  ceFAZolin (ANCEF) IV  1 g Intravenous Once  . chlorhexidine  15 mL Mouth Rinse BID  . clonazePAM  0.5 mg Oral BID  . darbepoetin  60 mcg Subcutaneous Q Wed-1800  . divalproex  750 mg Oral Q8H  . feeding supplement  1 Container Oral TID BM  . ferrous sulfate  325 mg Oral TID  . furosemide  160 mg Intravenous TID  . heparin  5,000 Units Subcutaneous Q8H  . potassium chloride  20 mEq Oral Once  . potassium chloride  20 mEq Oral Once  . sodium chloride  3 mL Intravenous Q12H  . vancomycin  125 mg Oral QID  . 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 Creatinine at baseline. I would suggest a creatinine clearance study prior to commiting patient to dialysis. Particularly as symptoms appear to have resolved. Still go ahead with perm cath placement.   LOS: 3 Herta Hink W @TODAY @10 :13 AM

## 2011-10-13 ENCOUNTER — Other Ambulatory Visit (HOSPITAL_COMMUNITY): Payer: Self-pay | Admitting: *Deleted

## 2011-10-13 ENCOUNTER — Encounter: Payer: Medicaid Other | Attending: Physical Medicine & Rehabilitation | Admitting: Physical Medicine & Rehabilitation

## 2011-10-13 DIAGNOSIS — I10 Essential (primary) hypertension: Secondary | ICD-10-CM | POA: Insufficient documentation

## 2011-10-13 DIAGNOSIS — D649 Anemia, unspecified: Secondary | ICD-10-CM | POA: Insufficient documentation

## 2011-10-13 DIAGNOSIS — G253 Myoclonus: Secondary | ICD-10-CM | POA: Insufficient documentation

## 2011-10-13 DIAGNOSIS — N19 Unspecified kidney failure: Secondary | ICD-10-CM | POA: Insufficient documentation

## 2011-10-13 DIAGNOSIS — G931 Anoxic brain damage, not elsewhere classified: Secondary | ICD-10-CM | POA: Insufficient documentation

## 2011-10-13 DIAGNOSIS — D696 Thrombocytopenia, unspecified: Secondary | ICD-10-CM | POA: Insufficient documentation

## 2011-10-13 DIAGNOSIS — G40909 Epilepsy, unspecified, not intractable, without status epilepticus: Secondary | ICD-10-CM | POA: Insufficient documentation

## 2011-10-13 LAB — RENAL FUNCTION PANEL
CO2: 22 mEq/L (ref 19–32)
Chloride: 102 mEq/L (ref 96–112)
Creatinine, Ser: 2.63 mg/dL — ABNORMAL HIGH (ref 0.50–1.10)
GFR calc non Af Amer: 25 mL/min — ABNORMAL LOW (ref >90)

## 2011-10-13 LAB — CREATININE CLEARANCE, URINE, 24 HOUR
Creatinine, Urine: 84.88 mg/dL
Urine Total Volume-CRCL: 800 mL

## 2011-10-13 MED ORDER — BOOST / RESOURCE BREEZE PO LIQD
1.0000 | Freq: Three times a day (TID) | ORAL | Status: DC
  Administered 2011-10-14 – 2011-10-16 (×6): 1 via ORAL

## 2011-10-13 MED ORDER — POTASSIUM CHLORIDE CRYS ER 20 MEQ PO TBCR
20.0000 meq | EXTENDED_RELEASE_TABLET | Freq: Once | ORAL | Status: AC
  Administered 2011-10-13: 20 meq via ORAL
  Filled 2011-10-13: qty 1

## 2011-10-13 NOTE — Progress Notes (Signed)
  Echocardiogram 2D Echocardiogram has been performed.  Heather Sims 10/13/2011, 4:33 PM

## 2011-10-13 NOTE — Progress Notes (Signed)
Family Medicine Teaching Service Daily Progress Note Service Page: [phone]  Subjective: Pt sleeping while entering room.  Talked to mom who is concerned about her diarrhea, but is the same as it was upon presentation.  Still on Vanc 125 mg qid PO and sent for a second stool PCR for C Diff.  Tunnel Catether placed yesterday and pt is afebrile. Dr. Hyman Hopes discussed with mom about holding off on Dialysis at this point as it is very time consuming and medically exhausting to pt and family.   Objective: Temp:  [97.6 F (36.4 C)-99.1 F (37.3 C)] 99.1 F (37.3 C) (07/23 1434) Pulse Rate:  [77-82] 77  (07/23 1434) Resp:  [18] 18  (07/23 1434) BP: (119-131)/(83-94) 119/83 mmHg (07/23 1434) SpO2:  [93 %-97 %] 95 % (07/23 1434) Weight:  [132 lb 11.5 oz (60.2 kg)] 132 lb 11.5 oz (60.2 kg) (07/23 0433) Exam: General: NAD, lying in bed, sleeping pleasantly Cardiovascular: RRR, 2/6 systolic murmur Respiratory: CTABL Abdomen: Distended but non-tender.  BS present Extremities: No edema, 2+ pulses.   CBC BMET   Lab 10/12/11 0535 10/10/11 1426 10/10/11 0930  WBC 9.6 11.4* 12.2*  HGB 7.9* 9.4* 7.7*  HCT 23.6* 28.0* 23.4*  PLT 135* 124* 129*    Lab 10/13/11 0555 10/12/11 1230 10/12/11 0535 10/11/11 0535  NA 139 -- 140 141  K 3.4* -- 3.1* 3.1*  CL 102 -- 102 102  CO2 22 -- 23 24  BUN 47* -- 47* 50*  CREATININE 2.63* 2.63* 3.00* --  GLUCOSE 85 -- 94 83  CALCIUM 8.0* -- 7.8* 7.4*     STOOL PCR C. DIFF - 1st sent negative.  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 Diff who presents with nausea/vomiting and diarrhea for approx one week, with frank dehydration.    1. Diarrhea: Still with loose stools but C.Diff PCR negative 1. Vanc taper has been restarted from  highest dose.  Plan for standard taper at this point. 2. With patient afebrile and no abdominal pain at this time. We are continuing to monitor.  Will get a second C Diff PCR of Stool to help reassure ourselves and mother that there is not a reoccurrence of C Diff 2. Altered mental status: This appears completely resolved today.  Unclear on etiology as renal believes this is secondary to her renal fxn causing uremia. 3. 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 1. Pt is volume overloaded with poor UOP.  Started on IV lasix per renal 2. Appreciate renal input.   3. Pt went for Tunnel Cath yesterday with no complications 4. AV fistula will be placed during hospital stay 5. Talked at length with Dr. Hyman Hopes and are in agreement that dialysis may not be best option at this point.  Pt is 21 y/o and the grueling schedule and medical complications from dialysis is great.  Risks explained to mother at length.  In the meantime, are collecting 24 Hr Creatinine Clearance on pt to get better estimate of kidney function 6. Will reassess tomorrow in accordance with Dr. Hyman Hopes and have a better plan for long term management for pt and will discuss with mother 7. CSW saw pt and recommends she have PT/OT consult for home management of multiple medical  conditions 4. HTN: normotensive overnight.  On home meds. 5. Seizure Disorder: On Depakote.  No seizures at this time. 6. Cardiomyopathy: Not an active issue but does have history of acute systolic heart failure.  This was in the setting of being acutely post-arrest.  Is not in heart failure at this time.  ECHO from 7/21 still pending to reassess EF 7. Protein Calorie Malnutrition: Will advance diet and will likely add ensure shakes as tolerated.  Nutrition consult placed to help with diet and further goals 8. Hypocalcemia/hyperphosphatemia: Management per renal but may benefit from binders in the near future. 9. Hypokalemia:  Reordered 20 meq KCl today as will be careful with this management due to her kidney fxn.  K+ today was 3.4   10. FEN/GI: ADAT 11. PPx: SHQ 12. Dispo: floor status, will need to further monitor renal status before ready for d/c  Gildardo Cranker, DO 10/13/2011, 9:00 AM

## 2011-10-13 NOTE — Progress Notes (Signed)
[facility] ROUNDING NOTE   Subjective:   Interval History: none had diateck placed yesterday  Objective:  Vital signs in last 24 hours:  Temp:  [97.6 F (36.4 C)-98.7 F (37.1 C)] 98.7 F (37.1 C) (07/23 0433) Pulse Rate:  [75-82] 82  (07/23 0433) Resp:  [15-29] 18  (07/23 0433) BP: (117-135)/(81-96) 131/94 mmHg (07/23 0433) SpO2:  [92 %-100 %] 93 % (07/23 0433) Weight:  [60.2 kg (132 lb 11.5 oz)] 60.2 kg (132 lb 11.5 oz) (07/23 0433)  Weight change: 0.2 kg (7.1 oz) Filed Weights   10/11/11 0702 10/12/11 0523 10/13/11 0433  Weight: 60 kg (132 lb 4.4 oz) 59.8 kg (131 lb 13.4 oz) 60.2 kg (132 lb 11.5 oz)    Intake/Output: I/O last 3 completed shifts: In: 372 [P.O.:120; I.V.:120; IV Piggyback:132] Out: 901 [Urine:900; Stool:1]   Intake/Output this shift:     CVS- RRR RS- CTA ABD- BS present soft non-distended EXT- no edema   Basic Metabolic Panel:  Lab 10/13/11 6213 10/12/11 0535 10/11/11 0535 10/10/11 1205 10/10/11 0500  NA 139 140 141 142 143  K 3.4* 3.1* 3.1* 3.2* 3.2*  CL 102 102 102 101 100  CO2 22 23 24 25 23   GLUCOSE 85 94 83 89 82  BUN 47* 47* 50* 52* 52*  CREATININE 2.63* 3.00* 3.43* 3.69* 3.91*  CALCIUM 8.0* 7.8* 7.4* -- --  MG -- -- -- -- --  PHOS 5.9* 6.1* 6.6* -- --    Liver Function Tests:  Lab 10/13/11 0555 10/12/11 0535 10/11/11 0535 10/09/11 1918  AST -- -- -- 22  ALT -- -- -- <5  ALKPHOS -- -- -- 37*  BILITOT -- -- -- 0.1*  PROT -- -- -- 6.5  ALBUMIN 1.5* 1.5* 1.4* 2.1*   No results found for this basename: LIPASE:5,AMYLASE:5 in the last 168 hours No results found for this basename: AMMONIA:3 in the last 168 hours  CBC:  Lab 10/12/11 0535 10/10/11 1426 10/10/11 0930 10/09/11 1918  WBC 9.6 11.4* 12.2* 9.3  NEUTROABS -- -- -- 7.0  HGB 7.9* 9.4* 7.7* 9.5*  HCT 23.6* 28.0* 23.4* 28.0*  MCV 94.4 95.2 95.9 95.6  PLT 135* 124* 129* 157    Cardiac Enzymes: No results found for this basename:  CKTOTAL:5,CKMB:5,CKMBINDEX:5,TROPONINI:5 in the last 168 hours  BNP: No components found with this basename: POCBNP:5  CBG: No results found for this basename: GLUCAP:5 in the last 168 hours  Microbiology: Results for orders placed during the hospital encounter of 10/09/11  CULTURE, BLOOD (ROUTINE X 2)     Status: Normal (Preliminary result)   Collection Time   10/10/11 12:05 AM      Component Value Range Status Comment   Specimen Description BLOOD HAND RIGHT   Final    Special Requests BOTTLES DRAWN AEROBIC ONLY 1CC   Final    Culture  Setup Time 10/10/2011 05:08   Final    Culture     Final    Value:        BLOOD CULTURE RECEIVED NO GROWTH TO DATE CULTURE WILL BE HELD FOR 5 DAYS BEFORE ISSUING A FINAL NEGATIVE REPORT   Report Status PENDING   Incomplete   CULTURE, BLOOD (ROUTINE X 2)     Status: Normal (Preliminary result)   Collection Time   10/10/11  1:05 AM      Component Value Range Status Comment   Specimen Description BLOOD RIGHT ARM   Final    Special Requests BOTTLES DRAWN AEROBIC  ONLY 1CC   Final    Culture  Setup Time 10/10/2011 11:34   Final    Culture     Final    Value:        BLOOD CULTURE RECEIVED NO GROWTH TO DATE CULTURE WILL BE HELD FOR 5 DAYS BEFORE ISSUING A FINAL NEGATIVE REPORT   Report Status PENDING   Incomplete   CLOSTRIDIUM DIFFICILE BY PCR     Status: Normal   Collection Time   10/10/11 12:06 PM      Component Value Range Status Comment   C difficile by pcr NEGATIVE  NEGATIVE Final   URINE CULTURE     Status: Normal   Collection Time   10/10/11 12:07 PM      Component Value Range Status Comment   Specimen Description URINE, CATHETERIZED   Final    Special Requests ADDED AT 1600   Final    Culture  Setup Time 10/10/2011 20:28   Final    Colony Count NO GROWTH   Final    Culture NO GROWTH   Final    Report Status 10/11/2011 FINAL   Final     Coagulation Studies:  Basename 10/12/11 0910  LABPROT 16.7*  INR 1.33    Urinalysis:  Basename  10/10/11 1207  COLORURINE YELLOW  LABSPEC 1.017  PHURINE 5.0  GLUCOSEU NEGATIVE  HGBUR LARGE*  BILIRUBINUR NEGATIVE  KETONESUR NEGATIVE  PROTEINUR 30*  UROBILINOGEN 0.2  NITRITE NEGATIVE  LEUKOCYTESUR SMALL*      Imaging: Ir Fluoro Guide Cv Line Right  10/12/2011  *RADIOLOGY REPORT*  Indication: In need of intravenous access for hemodialysis.  TUNNELED CENTRAL VENOUS HEMODIALYSIS CATHETER PLACEMENT WITH ULTRASOUND AND FLUOROSCOPIC GUIDANCE  Medications: Versed 2 mg IV; Fentanyl 25 mcg IV; Ancef 1 gm IV; The IV antibiotic was given in an appropriate time interval prior to skin puncture.  Contrast: 10 ml Omnipaque-300.  Total Moderate Sedation Time: 32 minutes.  Fluoroscopy Time: 4.7 minutes.  Complications:  None immediate  Findings / Procedure:  Informed written consent was obtained from the patient after a discussion of the risks, benefits, and alternatives to treatment. Questions regarding the procedure were encouraged and answered. The right neck and chest were prepped with chlorhexidine in a sterile fashion, and a sterile drape was applied covering the operative field.  Maximum barrier sterile technique with sterile gowns and gloves were used for the procedure.  A timeout was performed prior to the initiation of the procedure.  After creating a small venotomy incision, a micropuncture kit was utilized to access the right internal jugular vein under direct, real-time ultrasound guidance after the overlying soft tissues were anesthetized with 1% lidocaine with epinephrine.  Ultrasound image documentation was performed.  The microwire was kinked to measure appropriate catheter length. With the use of a Kumpe catheter, a stiff glidewire was advanced to the level of the IVC. Limited contrast injection confirmed appropriate positioning within the IVC.  Under fluoroscopic guidance, the venotomy was serially dilated ultimately allowing placement of a peel-away sheath.  A Hemosplit tunneled hemodialysis  catheter measuring 19 cm from tip to cuff was tunneled in a retrograde fashion from the anterior chest wall to the venotomy incision.  The catheter was then placed through the peel-away sheath with tips ultimately positioned within the superior aspect of the right atrium.  Final catheter positioning was confirmed and documented with a spot radiographic image.  The catheter aspirates and flushes normally.  The catheter was flushed with appropriate volume heparin dwells.  The catheter exit site was secured with a 0-Prolene retention suture.  The venotomy incision was closed with an interrupted 4-0 Vicryl, Dermabond and Steri-strips.  Dressings were applied.  The patient tolerated the procedure well without immediate post procedural complication.  IMPRESSION:  Successful placement of 19 cm tip to cuff tunneled hemodialysis catheter via the right internal jugular vein with tips terminating within the superior aspect of the right atrium.  The catheter is ready for immediate use.  Original Report Authenticated By: Waynard Reeds, M.D.   Ir US Guide Vasc Access Right  10/12/2011  *RADIOLOGY REPORT*  Indication: In need of intravenous access for hemodialysis.  TUNNELED CENTRAL VENOUS HEMODIALYSIS CATHETER PLACEMENT WITH ULTRASOUND AND FLUOROSCOPIC GUIDANCE  Medications: Versed 2 mg IV; Fentanyl 25 mcg IV; Ancef 1 gm IV; The IV antibiotic was given in an appropriate time interval prior to skin puncture.  Contrast: 10 ml Omnipaque-300.  Total Moderate Sedation Time: 32 minutes.  Fluoroscopy Time: 4.7 minutes.  Complications:  None immediate  Findings / Procedure:  Informed written consent was obtained from the patient after a discussion of the risks, benefits, and alternatives to treatment. Questions regarding the procedure were encouraged and answered. The right neck and chest were prepped with chlorhexidine in a sterile fashion, and a sterile drape was applied covering the operative field.  Maximum barrier sterile  technique with sterile gowns and gloves were used for the procedure.  A timeout was performed prior to the initiation of the procedure.  After creating a small venotomy incision, a micropuncture kit was utilized to access the right internal jugular vein under direct, real-time ultrasound guidance after the overlying soft tissues were anesthetized with 1% lidocaine with epinephrine.  Ultrasound image documentation was performed.  The microwire was kinked to measure appropriate catheter length. With the use of a Kumpe catheter, a stiff glidewire was advanced to the level of the IVC. Limited contrast injection confirmed appropriate positioning within the IVC.  Under fluoroscopic guidance, the venotomy was serially dilated ultimately allowing placement of a peel-away sheath.  A Hemosplit tunneled hemodialysis catheter measuring 19 cm from tip to cuff was tunneled in a retrograde fashion from the anterior chest wall to the venotomy incision.  The catheter was then placed through the peel-away sheath with tips ultimately positioned within the superior aspect of the right atrium.  Final catheter positioning was confirmed and documented with a spot radiographic image.  The catheter aspirates and flushes normally.  The catheter was flushed with appropriate volume heparin dwells.  The catheter exit site was secured with a 0-Prolene retention suture.  The venotomy incision was closed with an interrupted 4-0 Vicryl, Dermabond and Steri-strips.  Dressings were applied.  The patient tolerated the procedure well without immediate post procedural complication.  IMPRESSION:  Successful placement of 19 cm tip to cuff tunneled hemodialysis catheter via the right internal jugular vein with tips terminating within the superior aspect of the right atrium.  The catheter is ready for immediate use.  Original Report Authenticated By: Waynard Reeds, M.D.   Dg Abd 2 Views  10/11/2011  *RADIOLOGY REPORT*  Clinical Data: Abdominal  distention.  Nausea.  ABDOMEN - 2 VIEW  Comparison: 09/19/2011  Findings: There is prominent ascites.  No dilated loops of bowel. Decubitus view demonstrates no free air.  IMPRESSION:  1.  Extensive ascites. 2.  No evidence of bowel obstruction or other acute abnormality.  Original Report Authenticated By: Gwynn Burly, M.D.     Medications:        .  amLODipine  10 mg Oral Daily  . antiseptic oral rinse  15 mL Mouth Rinse q12n4p  . carvedilol  25 mg Oral BID WC  .  ceFAZolin (ANCEF) IV  1 g Intravenous Once  . chlorhexidine  15 mL Mouth Rinse BID  . clonazePAM  0.5 mg Oral BID  . darbepoetin  60 mcg Subcutaneous Q Wed-1800  . divalproex  750 mg Oral Q8H  . feeding supplement  1 Container Oral TID BM  . fentaNYL      . ferrous sulfate  325 mg Oral TID  . furosemide  160 mg Intravenous TID  . heparin  5,000 Units Subcutaneous Q8H  . potassium chloride  20 mEq Oral Once  . sodium chloride  3 mL Intravenous Q12H  . 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 Creatinine at baseline. I would suggest a creatinine clearance study prior to commiting patient to dialysis. Particularly as symptoms appear to have resolved. Still go ahead with perm cath placement.  Long discussion with mother today. I am not sure that dialysis is going to really help Maleka and am concerned that with the anoxic brain injury and starting dialysis therapy at this time would be stressful and negatively impact her quality of life. I suggest a detailed plan of care with her mother and the entire health care team prior to initiating long term treatments such as dialysis.   LOS: 4 Benn Tarver W @TODAY @10 :15 AM

## 2011-10-13 NOTE — Progress Notes (Signed)
[facility] Attending Daily Note: Antowan Samford MD [phone] pager office [phone] I  have seen and examined this patient, reviewed their chart. I have discussed this patient with the resident. I agree with the resident's findings, assessment and care plan. 

## 2011-10-13 NOTE — Clinical Social Work Psychosocial (Signed)
     Clinical Social Work Department BRIEF PSYCHOSOCIAL ASSESSMENT 10/13/2011  Patient:  Heather Sims, Heather Sims     Account Number:  [id number]     Admit date:  10/09/2011  Clinical Social Worker:  Lourdes Sledge  Date/Time:  10/13/2011 03:31 PM  Referred by:  Physician  Date Referred:  10/13/2011 Referred for  Other - See comment   Other Referral:   Interview type:  Patient Other interview type:   CSW completed assessment with pt mother Rogelia Boga [phone]    PSYCHOSOCIAL DATA Living Status:  PARENTS Admitted from facility:   Level of care:   Primary support name:  Rogelia Boga [phone] Primary support relationship to patient:  PARENT Degree of support available:   Pt lives with mother Rogelia Boga [phone] who is pt main caregiver.    CURRENT CONCERNS Current Concerns  Other - See comment   Other Concerns:   Referral :"Pt with extensive medical history, all since Jan 2013 h/x of anoxic brain injury, recurrent admissions for c.diff/dehydration; pt lives with mother. Question of level of need at home, given history and recent admissions for similar problems"    SOCIAL WORK ASSESSMENT / PLAN CSW received referral with concerns with pt multiple admissions, and medical problems. CSW visited pt room and observed that pt had very little energy and shortly after fell asleep. CSW spoke to pt mother who stated she is pt main caregiver when pt not able to care for self. CSW explored whether pt is connected with resources and if pt mother feels comfortable/able to provide care for pt.    Pt mother stated pt has a [facility] Kid[facility] MD and was receiving outpatient PT. Pt mother also stated that she has no problem providing pt with necessary care. CSW informed pt that it would be helpful to see if what PT/OT recommended to see if pt could potentially benefit from [facility] services and if it would be covered by her insurance. Pt mother appreciative of CSW visit and agrees with  PT/OT working with pt.   Assessment/plan status:  Psychosocial Support/Ongoing Assessment of Needs Other assessment/ plan:   Information/referral to community resources:   CSW provided her contact information to pt and mother and informed pt mother that CSW would inform MD of need for PT/OT to evaluate pt to determine level of care she needs.    PATIENTS/FAMILYS RESPONSE TO PLAN OF CARE: Pt alert, oriented however laying in bed appearing tired and weak. Mother very receptive of CSW questions and appreciative of visit. Pt mother agreeable to [facility] services if recommended by PT. CSW to follow and explore whether pt could qualify for any other services.

## 2011-10-13 NOTE — Progress Notes (Addendum)
INITIAL ADULT NUTRITION ASSESSMENT Date: 10/13/2011   Time: 3:03 PM  Reason for Assessment: MD Consult for Nutrition Assessment  ASSESSMENT: Female 21 y.o.  Dx: CKD, renal failure, nephropathy  Hx:  Past Medical History  Diagnosis Date  . Pneumonia     'walking pneumonia'  . Seizures   . Anemia   . Heart attack   . Cardiomyopathy   . Heart failure   . CHF (congestive heart failure)   . Ileitis   . Chronic kidney disease   . Renal disorder   . MPGN (membranoproliferative glomerulonephritis), type 2   . Hypertension   . Brain injury    Past Surgical History  Procedure Date  . Renal biopsy    Related Meds:     . amLODipine  10 mg Oral Daily  . carvedilol  25 mg Oral BID WC  . clonazePAM  0.5 mg Oral BID  . darbepoetin  60 mcg Subcutaneous Q Wed-1800  . divalproex  750 mg Oral Q8H  . feeding supplement  1 Container Oral TID BM  . fentaNYL      . ferrous sulfate  325 mg Oral TID  . furosemide  160 mg Intravenous TID  . heparin  5,000 Units Subcutaneous Q8H  . potassium chloride  20 mEq Oral Once  . sodium chloride  3 mL Intravenous Q12H  . vancomycin  125 mg Oral QID  . DISCONTD: antiseptic oral rinse  15 mL Mouth Rinse q12n4p  . DISCONTD: chlorhexidine  15 mL Mouth Rinse BID   Ht: 5\' 2"  (157.5 cm)  Wt: 132 lb 11.5 oz (60.2 kg) - suspect artificially high given "markedly distended abdomen"  Ideal Wt: 50 kg % Ideal Wt: 120%  Wt Readings from Last 15 Encounters:  10/13/11 132 lb 11.5 oz (60.2 kg)  09/24/11 140 lb 14.4 oz (63.912 kg)  09/01/11 119 lb (53.978 kg)  08/25/11 117 lb 8.1 oz (53.3 kg)  07/03/11 107 lb (48.535 kg)  07/01/11 107 lb (48.535 kg)  05/27/11 122 lb 5.7 oz (55.5 kg)  05/07/11 127 lb 13.9 oz (58 kg)  04/28/11 125 lb 3.2 oz (56.79 kg)  04/10/11 153 lb 4.8 oz (69.536 kg)  Usual Wt: 153 lb % Usual Wt: 86%  Body mass index is 24.27 kg/(m^2). Weight is WNL.  Food/Nutrition Related Hx: pt with variable intake associated with multiple  hospital readmissions; recent abdominal fluid collection; diarrhea  Labs:  CMP     Component Value Date/Time   NA 139 10/13/2011 0555   K 3.4* 10/13/2011 0555   CL 102 10/13/2011 0555   CO2 22 10/13/2011 0555   GLUCOSE 85 10/13/2011 0555   BUN 47* 10/13/2011 0555   CREATININE 2.63* 10/13/2011 0555   CREATININE 2.63* 10/12/2011 1230   CALCIUM 8.0* 10/13/2011 0555   PROT 6.5 10/09/2011 1918   ALBUMIN 1.5* 10/13/2011 0555   AST 22 10/09/2011 1918   ALT <5 10/09/2011 1918   ALKPHOS 37* 10/09/2011 1918   BILITOT 0.1* 10/09/2011 1918   GFRNONAA 25* 10/13/2011 0555   GFRAA 29* 10/13/2011 0555   Phosphorus  Date/Time Value Range Status  10/13/2011  5:55 AM 5.9* 2.3 - 4.6 mg/dL Final     Intake/Output Summary (Last 24 hours) at 10/13/11 1504 Last data filed at 10/13/11 0554  Gross per 24 hour  Intake    120 ml  Output    501 ml  Net   -381 ml   Diet Order: General  Supplements/Tube Feeding: Ensure Pudding PO TID  IVF:    Estimated Nutritional Needs:   Kcal:  1800 - 2000 kcal Protein: 60 - 70 grams Fluid:  1.8 liters daily  Pt seen by RD during recent acute hospitalizations. Pt with significant past medical hx of C3 Nephritis, seizures, cardiomyopathy, CKD IV, HTN, and recurrent C. Diff.   Admitted with d/n/v. Per nephrologist, pt with significant ascites and mild LE edema on admission. Nephrologist suggests her symptoms may be due to uremia. Team is collecting 24-hour Creatinine Clearance to get better estimate of kidney function. Noted per nephrology "I am not sure that dialysis is going to really help Camdyn and am concerned that with the anoxic brain injury and starting dialysis therapy at this time would be stressful and negatively impact her quality of life. I suggest a detailed plan of care with her mother and the entire health care team prior to initiating long term treatments such as dialysis." R IJ dialysis catheter placed on 7/22.   C diff is negative. RN reports pt continues to  have diarrhea at this time. Per RN, pt consumed approximately 1/3 of breakfast tray and did not each lunch. RN reports pt is refusing the Ensure Supplements.  Noted on weight hx significant fluctuations. At the beginning of the year, pt's weight was around 150 lb. Weight dropped down to 107 lb within 3 months. Since that time, wt has increased, however noted pt has been admitted with abdominal distention and wt gain is likely fluid related.  Pt meets criteria for severe malnutrition in the context of chronic illness as evidenced by 14% wt loss x 6 months, intake of less than 75% of estimated energy requirement for at least 1 month, severe fluid accumulation and likely muscle/fat wasting.  NUTRITION DIAGNOSIS: -Inadequate oral intake (NI-2.1).  Status: Ongoing  RELATED TO: multiple medical comorbidities and recent GI distress   AS EVIDENCE BY: 14% wt loss x 6 months, intake of <75% of estimated energy intake for at least 1 month.  MONITORING/EVALUATION(Goals): Goal: Pt to meet >/= 90% of their estimated nutrition needs Monitor: weights, labs, PO intake, I/O's  EDUCATION NEEDS: -Education not appropriate at this time  INTERVENTION: 1. Agree with liberalized diet of Regular - continue least restrictive diet at this time 2. Discontinue Ensure Supplements, RN reports pt is refusing 3. Resource Breeze po TID, each supplement provides 250 kcal and 9 grams of protein. 4. Monitor magnesium, potassium, and phosphorus daily for at least 3 days, MD to replete as needed, as pt is at risk for refeeding syndrome given dx of severe malnutrition. 5. If within goals of care recommend initiation of nutrition support to help meet nutrition needs as pt remains nutritionally compromised given ongoing diarrhea, prolonged suboptimal PO intake, and  dehydration.  DOCUMENTATION CODES Per approved criteria  -Severe malnutrition in the context of chronic illness   Jarold Motto MS, RD, LDN Pager:  [phone] After-hours pager: [phone]

## 2011-10-14 ENCOUNTER — Encounter (HOSPITAL_COMMUNITY): Payer: Medicaid Other

## 2011-10-14 LAB — RENAL FUNCTION PANEL
Albumin: 1.4 g/dL — ABNORMAL LOW (ref 3.5–5.2)
GFR calc Af Amer: 31 mL/min — ABNORMAL LOW (ref >90)
GFR calc non Af Amer: 27 mL/min — ABNORMAL LOW (ref >90)
Glucose, Bld: 84 mg/dL (ref 70–99)
Phosphorus: 6.1 mg/dL — ABNORMAL HIGH (ref 2.3–4.6)
Potassium: 3.7 mEq/L (ref 3.5–5.1)
Sodium: 137 mEq/L (ref 135–145)

## 2011-10-14 MED ORDER — LOPERAMIDE HCL 1 MG/5ML PO LIQD
2.0000 mg | Freq: Two times a day (BID) | ORAL | Status: DC
  Administered 2011-10-14 – 2011-10-16 (×4): 2 mg via ORAL
  Filled 2011-10-14 (×6): qty 10

## 2011-10-14 NOTE — Evaluation (Signed)
Physical Therapy Evaluation Patient Details Name: Heather Sims MRN: [id number] DOB: 10-14-1990 Today's Date: 10/14/2011 Time: 7829-5621 PT Time Calculation (min): 28 min  PT Assessment / Plan / Recommendation Clinical Impression  Pt is a 21 y/o female admitted with CKD and renal failure with a history of anoxic brain injury along with the below PT problem list.  Pt would benefit from acute PT to maximize independence and facilitate d/c home with HHPT as well as 24 hour assist from mom.    PT Assessment  Patient needs continued PT services    Follow Up Recommendations  Home health PT;Supervision/Assistance - 24 hour    Barriers to Discharge None      Equipment Recommendations  None recommended by PT    Recommendations for Other Services     Frequency Min 3X/week    Precautions / Restrictions Precautions Precautions: Fall Restrictions Weight Bearing Restrictions: No   Pertinent Vitals/Pain None      Mobility  Bed Mobility Bed Mobility: Rolling Right;Right Sidelying to Sit;Sit to Sidelying Right;Rolling Left Rolling Right: 4: Min assist;With rail Rolling Left: 4: Min assist;With rail Right Sidelying to Sit: 1: +2 Total assist;HOB flat Right Sidelying to Sit: Patient Percentage: 60% Sit to Sidelying Right: 1: +2 Total assist;HOB flat Sit to Sidelying Right: Patient Percentage: 60% Details for Bed Mobility Assistance: Assist to facilitate rotation as well as trunk translation with cues for sequence.  Assist due to weakness and fatigue. Transfers Transfers: Sit to Stand;Stand to Sit Sit to Stand: 1: +2 Total assist;From bed Sit to Stand: Patient Percentage: 60% Stand to Sit: 1: +2 Total assist;To bed Stand to Sit: Patient Percentage: 60% Details for Transfer Assistance: Assist to block bilateral LEs at knees due to weakness as well as to extend at hips/trunk while assisting with balance.  Cues for sequence and safety.  Limited by  fatigue. Ambulation/Gait Ambulation/Gait Assistance: Not tested (comment) Stairs: No Wheelchair Mobility Wheelchair Mobility: No    Exercises     PT Diagnosis: Difficulty walking;Generalized weakness  PT Problem List: Decreased strength;Decreased activity tolerance;Decreased balance;Decreased mobility;Decreased cognition;Decreased knowledge of use of DME PT Treatment Interventions: DME instruction;Gait training;Functional mobility training;Therapeutic activities;Balance training;Cognitive remediation;Patient/family education   PT Goals Acute Rehab PT Goals PT Goal Formulation: With patient/family Time For Goal Achievement: 10/21/11 Potential to Achieve Goals: Good Pt will Roll Supine to Right Side: with supervision PT Goal: Rolling Supine to Right Side - Progress: Goal set today Pt will Roll Supine to Left Side: with supervision PT Goal: Rolling Supine to Left Side - Progress: Goal set today Pt will go Supine/Side to Sit: with supervision PT Goal: Supine/Side to Sit - Progress: Goal set today Pt will go Sit to Supine/Side: with supervision PT Goal: Sit to Supine/Side - Progress: Goal set today Pt will go Sit to Stand: with min assist PT Goal: Sit to Stand - Progress: Goal set today Pt will go Stand to Sit: with min assist PT Goal: Stand to Sit - Progress: Goal set today Pt will Ambulate: 51 - 150 feet;with min assist;with least restrictive assistive device PT Goal: Ambulate - Progress: Goal set today  Visit Information  Last PT Received On: 10/14/11 Assistance Needed: +2    Subjective Data  Subjective: "Yes.  We can do it." Patient Stated Goal: Mom states goal is to d/c home.   Prior Functioning  Home Living Lives With: Family (Mom and brother.) Available Help at Discharge: Available 24 hours/day;Family Type of Home: House Home Access: Stairs to enter [employer]  of Steps: 2 (Mom bumps w/c up stairs.) Home Layout: One level Bathroom Shower/Tub: Tub/shower  unit;Curtain Bathroom Toilet: Standard Home Adaptive Equipment: Bedside commode/3-in-1;Tub transfer bench;Walker - rolling;Wheelchair - manual Prior Function Level of Independence: Needs assistance (Uses RW.) Needs Assistance: Gait;Transfers Gait Assistance: Supervision with RW. Transfer Assistance: Min assist. Able to Take Stairs?: No (Mom uses w/c and bumps pt up stairs.) Driving: No Communication Communication: No difficulties    Cognition  Overall Cognitive Status: History of cognitive impairments - at baseline Arousal/Alertness: Awake/alert Orientation Level: Appears intact for tasks assessed Behavior During Session: [facility] for tasks performed    Extremity/Trunk Assessment Right Upper Extremity Assessment RUE ROM/Strength/Tone:  (Defer to OT evaluation.) Left Upper Extremity Assessment LUE ROM/Strength/Tone:  (Defer to OT evaluation.) Right Lower Extremity Assessment RLE ROM/Strength/Tone: Deficits RLE ROM/Strength/Tone Deficits: 4-/5 RLE Coordination: Deficits Left Lower Extremity Assessment LLE ROM/Strength/Tone: Deficits LLE ROM/Strength/Tone Deficits: 4-/5 LLE Coordination: Deficits Trunk Assessment Trunk Assessment: Normal   Balance Balance Balance Assessed: Yes Static Sitting Balance Static Sitting - Balance Support: Bilateral upper extremity supported;Feet supported Static Sitting - Level of Assistance: 4: Min assist Static Sitting - Comment/# of Minutes: Pt sat EOB for 5 minutes with min assist due to posterior lean and fatigue.  Cues to shift weight anterior to minimize posterior lean.  End of Session PT - End of Session Equipment Utilized During Treatment: Gait belt Activity Tolerance: Patient limited by fatigue Patient left: in bed;with call bell/phone within reach;with family/visitor present Nurse Communication: Mobility status  GP     Cephus Shelling 10/14/2011, 12:41 PM  10/14/2011 Cephus Shelling, PT, DPT [phone]

## 2011-10-14 NOTE — Progress Notes (Signed)
Family Medicine Teaching Service Daily Progress Note Service Page: [phone]  Subjective: Pt sleeping while entering room.  Long discussion yesterday AM with mother about dialysis, risks vs benefits.  Spoke with Dr. Hyman Hopes yesterday as well who went over the same objectives with pt's mother.  At this time, they would like to hold off on dialysis as long as possible.  Pt still have diarrhea but a little better than yesterday, mother still concerned.  Mom also concerned about pt appetite, and would like further management on increaing her appetite.   Objective: Temp:  [98.1 F (36.7 C)-99.1 F (37.3 C)] 98.1 F (36.7 C) (07/24 0536) Pulse Rate:  [77-78] 78  (07/24 0536) Resp:  [18] 18  (07/24 0536) BP: (119-127)/(81-83) 127/81 mmHg (07/24 0536) SpO2:  [95 %-96 %] 96 % (07/24 0536) Exam: General: NAD, lying in bed, sleeping pleasantly Cardiovascular: RRR, 2/6 systolic murmur Respiratory: CTABL Abdomen: Distended but non-tender.  BS present Extremities: No edema, 2+ pulses. Neuro: Pt has trouble with conversation. Is oriented to person but limited dialect to further assess.   CBC BMET   Lab 10/12/11 0535 10/10/11 1426 10/10/11 0930  WBC 9.6 11.4* 12.2*  HGB 7.9* 9.4* 7.7*  HCT 23.6* 28.0* 23.4*  PLT 135* 124* 129*    24 HR CREATININE - 679 (Normal [phone])  Calculated CREA CLEARANCE - 18 (Normal 90-125)  Lab 10/14/11 0720 10/13/11 0555 10/12/11 1230 10/12/11 0535  NA 137 139 -- 140  K 3.7 3.4* -- 3.1*  CL 101 102 -- 102  CO2 22 22 -- 23  BUN 45* 47* -- 47*  CREATININE 2.45* 2.63* 2.63* --  GLUCOSE 84 85 -- 94  CALCIUM 8.2* 8.0* -- 7.8*     STOOL PCR C. DIFF - 1st sent negative.  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 Diff who presents with nausea/vomiting and diarrhea  for approx one week, with frank dehydration.    1. Diarrhea: Still with loose stools but C.Diff PCR negative 1. Vanc taper has been restarted from highest dose.  Plan for standard taper at this point. 2. No Clinical S/Sx of ongoing C. Diff.  Will continue vanc taper and give loperamide 2 gm bid to help control diarrhea  2. Altered mental status: This appears completely resolved today.  Unclear on etiology as renal believes this is secondary to her renal fxn causing uremia.  Per mom, about back at baseline from the past couple of months 3. 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 1. Pt is volume overloaded with poor UOP.  Started on IV lasix per renal 2. Appreciate renal input.   3. Pt had tunnel cath placed and will have AV fistula for possible future dialysis. 4. Talked at length with Dr. Hyman Hopes and are in agreement that dialysis may not be best option at this point.  Pt is 21 y/o and the grueling schedule and medical complications from dialysis is great.  Risks explained to mother at length.  Crea clearance is 18 (Good estimation of GFR but does tend to overestimate by 10-20 % due to tubular secretion of crea).  Will f/u with renal about what to do with this and at what point they would recommend dialysis for patient. 5. Will discuss with Dr. Hyman Hopes and pt's mom  best option from this point. 6. Consult to OT/PT for home health needs 4. HTN: normotensive overnight.  On home meds. 5. Seizure Disorder: On Depakote.  No seizures at this time. 6. Cardiomyopathy: Not an active issue but does have history of acute systolic heart failure.  This was in the setting of being acutely post-arrest.  Is not in heart failure at this time.  ECHO from 7/21 still pending to reassess EF 7. Protein Calorie Malnutrition: Will advance diet and will likely add ensure shakes as tolerated.  Nutrition consult placed to help with diet and further goals.  Pt may need appetite stimulator  on d/c. 8. Hypocalcemia/hyperphosphatemia: Management per renal but may benefit from binders in the near future. 9. Hypokalemia: K+ 3.7 today.  Will continue to monitor.   10. FEN/GI: ADAT 11. PPx: SHQ 12. Dispo: floor status, will need to further monitor renal status before ready for d/c  Gildardo Cranker, DO 10/14/2011, 8:56 AM

## 2011-10-14 NOTE — Progress Notes (Signed)
FMTS Attending Daily Note: Heather Levy MD [phone] pager office [phone] I  have seen and examined this patient, reviewed their chart. I have discussed this patient with the resident. I agree with the resident's findings, assessment and care plan. Renal has nothing further they want done at this time. Mom is still concerned about diarrhea---I suspect it is related to poor nutrition and previous C Diff infection. We have continued her on oral vanc --now we have two negative C. Diff cultures from stool. I think reasonable to d/c vanc--it may actually be contributing to diarrhea at thispooint. Mom not very comfortable with that. Will aask GI to weigh in and to also give Mom some initial information about PEG tub feeds as she has questions about this. Unless GI has further inpatient work up---we can likely d/c home tomorrow. ECHO done and results P
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
## 2011-10-14 NOTE — Progress Notes (Signed)
Clinical Child psychotherapist (CSW) contacted pt mother and CSW was informed that pt mother had questions pertaining to becoming pt HCPOA. Pt mother stated she thought she had completed HCPOA paperwork when pt was in the hospital in January. CSW informed pt mother that CSW does not see record of it in the system however she would have been given the original copy. Pt mother stated she was unsure whether she had a copy or if indeed pt had completed the paperwork. CSW informed pt mother that unfortunately a HCPOA packet cannot be done during this admission as pt has not been communicating very much during this admission. Pt has been asleep during most staff visits including this CSW's. CSW did inform mother that it is recommended that she contact an attorney who can facilitate with mother becoming guardian/Dual POA. Pt mother agreeable and appreciative of CSW intervention. Mother did not have any additional concerns. CSW signing off as pt will dc home with [facility] services.  Theresia Bough, MSW, Theresia Majors [phone]

## 2011-10-14 NOTE — Progress Notes (Signed)
[facility] ROUNDING NOTE   Subjective:   Interval History:poor appetite  Objective:  Vital signs in last 24 hours:  Temp:  [98.1 F (36.7 C)-99.1 F (37.3 C)] 98.1 F (36.7 C) (07/24 0536) Pulse Rate:  [77-78] 78  (07/24 0536) Resp:  [18] 18  (07/24 0536) BP: (119-127)/(81-83) 127/81 mmHg (07/24 0536) SpO2:  [95 %-96 %] 96 % (07/24 0536)  Weight change:  Filed Weights   10/11/11 0702 10/12/11 0523 10/13/11 0433  Weight: 60 kg (132 lb 4.4 oz) 59.8 kg (131 lb 13.4 oz) 60.2 kg (132 lb 11.5 oz)    Intake/Output: I/O last 3 completed shifts: In: 170 [I.V.:120; IV Piggyback:50] Out: 876 [Urine:875; Stool:1]   Intake/Output this shift:     CVS- RRR RS- CTA ABD- distended bowels sounds active EXT- no edema   Basic Metabolic Panel:  Lab 10/14/11 1610 10/13/11 0555 10/12/11 1230 10/12/11 0535 10/11/11 0535  NA 137 139 -- 140 141  K 3.7 3.4* -- 3.1* 3.1*  CL 101 102 -- 102 102  CO2 22 22 -- 23 24  GLUCOSE 84 85 -- 94 83  BUN 45* 47* -- 47* 50*  CREATININE 2.45* 2.63* 2.63* 3.00* 3.43*  CALCIUM 8.2* 8.0* -- 7.8* --  MG -- -- -- -- --  PHOS 6.1* 5.9* -- 6.1* 6.6*    Liver Function Tests:  Lab 10/14/11 0720 10/13/11 0555 10/12/11 0535 10/11/11 0535 10/09/11 1918  AST -- -- -- -- 22  ALT -- -- -- -- <5  ALKPHOS -- -- -- -- 37*  BILITOT -- -- -- -- 0.1*  PROT -- -- -- -- 6.5  ALBUMIN 1.4* 1.5* 1.5* 1.4* 2.1*   No results found for this basename: LIPASE:5,AMYLASE:5 in the last 168 hours No results found for this basename: AMMONIA:3 in the last 168 hours  CBC:  Lab 10/12/11 0535 10/10/11 1426 10/10/11 0930 10/09/11 1918  WBC 9.6 11.4* 12.2* 9.3  NEUTROABS -- -- -- 7.0  HGB 7.9* 9.4* 7.7* 9.5*  HCT 23.6* 28.0* 23.4* 28.0*  MCV 94.4 95.2 95.9 95.6  PLT 135* 124* 129* 157    Cardiac Enzymes: No results found for this basename: CKTOTAL:5,CKMB:5,CKMBINDEX:5,TROPONINI:5 in the last 168 hours  BNP: No components found with this basename:  POCBNP:5  CBG: No results found for this basename: GLUCAP:5 in the last 168 hours  Microbiology: Results for orders placed during the hospital encounter of 10/09/11  CULTURE, BLOOD (ROUTINE X 2)     Status: Normal (Preliminary result)   Collection Time   10/10/11 12:05 AM      Component Value Range Status Comment   Specimen Description BLOOD HAND RIGHT   Final    Special Requests BOTTLES DRAWN AEROBIC ONLY 1CC   Final    Culture  Setup Time 10/10/2011 05:08   Final    Culture     Final    Value:        BLOOD CULTURE RECEIVED NO GROWTH TO DATE CULTURE WILL BE HELD FOR 5 DAYS BEFORE ISSUING A FINAL NEGATIVE REPORT   Report Status PENDING   Incomplete   CULTURE, BLOOD (ROUTINE X 2)     Status: Normal (Preliminary result)   Collection Time   10/10/11  1:05 AM      Component Value Range Status Comment   Specimen Description BLOOD RIGHT ARM   Final    Special Requests BOTTLES DRAWN AEROBIC ONLY 1CC   Final    Culture  Setup Time 10/10/2011 11:34  Final    Culture     Final    Value:        BLOOD CULTURE RECEIVED NO GROWTH TO DATE CULTURE WILL BE HELD FOR 5 DAYS BEFORE ISSUING A FINAL NEGATIVE REPORT   Report Status PENDING   Incomplete   CLOSTRIDIUM DIFFICILE BY PCR     Status: Normal   Collection Time   10/10/11 12:06 PM      Component Value Range Status Comment   C difficile by pcr NEGATIVE  NEGATIVE Final   URINE CULTURE     Status: Normal   Collection Time   10/10/11 12:07 PM      Component Value Range Status Comment   Specimen Description URINE, CATHETERIZED   Final    Special Requests ADDED AT 1600   Final    Culture  Setup Time 10/10/2011 20:28   Final    Colony Count NO GROWTH   Final    Culture NO GROWTH   Final    Report Status 10/11/2011 FINAL   Final   CLOSTRIDIUM DIFFICILE BY PCR     Status: Normal   Collection Time   10/13/11  2:00 PM      Component Value Range Status Comment   C difficile by pcr NEGATIVE  NEGATIVE Final     Coagulation Studies:  Basename  10/12/11 0910  LABPROT 16.7*  INR 1.33    Urinalysis: No results found for this basename: COLORURINE:2,APPERANCEUR:2,LABSPEC:2,PHURINE:2,GLUCOSEU:2,HGBUR:2,BILIRUBINUR:2,KETONESUR:2,PROTEINUR:2,UROBILINOGEN:2,NITRITE:2,LEUKOCYTESUR:2 in the last 72 hours    Imaging: Ir Fluoro Guide Cv Line Right  10/12/2011  *RADIOLOGY REPORT*  Indication: In need of intravenous access for hemodialysis.  TUNNELED CENTRAL VENOUS HEMODIALYSIS CATHETER PLACEMENT WITH ULTRASOUND AND FLUOROSCOPIC GUIDANCE  Medications: Versed 2 mg IV; Fentanyl 25 mcg IV; Ancef 1 gm IV; The IV antibiotic was given in an appropriate time interval prior to skin puncture.  Contrast: 10 ml Omnipaque-300.  Total Moderate Sedation Time: 32 minutes.  Fluoroscopy Time: 4.7 minutes.  Complications:  None immediate  Findings / Procedure:  Informed written consent was obtained from the patient after a discussion of the risks, benefits, and alternatives to treatment. Questions regarding the procedure were encouraged and answered. The right neck and chest were prepped with chlorhexidine in a sterile fashion, and a sterile drape was applied covering the operative field.  Maximum barrier sterile technique with sterile gowns and gloves were used for the procedure.  A timeout was performed prior to the initiation of the procedure.  After creating a small venotomy incision, a micropuncture kit was utilized to access the right internal jugular vein under direct, real-time ultrasound guidance after the overlying soft tissues were anesthetized with 1% lidocaine with epinephrine.  Ultrasound image documentation was performed.  The microwire was kinked to measure appropriate catheter length. With the use of a Kumpe catheter, a stiff glidewire was advanced to the level of the IVC. Limited contrast injection confirmed appropriate positioning within the IVC.  Under fluoroscopic guidance, the venotomy was serially dilated ultimately allowing placement of a peel-away  sheath.  A Hemosplit tunneled hemodialysis catheter measuring 19 cm from tip to cuff was tunneled in a retrograde fashion from the anterior chest wall to the venotomy incision.  The catheter was then placed through the peel-away sheath with tips ultimately positioned within the superior aspect of the right atrium.  Final catheter positioning was confirmed and documented with a spot radiographic image.  The catheter aspirates and flushes normally.  The catheter was flushed with appropriate volume heparin dwells.  The catheter exit site was secured with a 0-Prolene retention suture.  The venotomy incision was closed with an interrupted 4-0 Vicryl, Dermabond and Steri-strips.  Dressings were applied.  The patient tolerated the procedure well without immediate post procedural complication.  IMPRESSION:  Successful placement of 19 cm tip to cuff tunneled hemodialysis catheter via the right internal jugular vein with tips terminating within the superior aspect of the right atrium.  The catheter is ready for immediate use.  Original Report Authenticated By: Waynard Reeds, M.D.   Ir US Guide Vasc Access Right  10/12/2011  *RADIOLOGY REPORT*  Indication: In need of intravenous access for hemodialysis.  TUNNELED CENTRAL VENOUS HEMODIALYSIS CATHETER PLACEMENT WITH ULTRASOUND AND FLUOROSCOPIC GUIDANCE  Medications: Versed 2 mg IV; Fentanyl 25 mcg IV; Ancef 1 gm IV; The IV antibiotic was given in an appropriate time interval prior to skin puncture.  Contrast: 10 ml Omnipaque-300.  Total Moderate Sedation Time: 32 minutes.  Fluoroscopy Time: 4.7 minutes.  Complications:  None immediate  Findings / Procedure:  Informed written consent was obtained from the patient after a discussion of the risks, benefits, and alternatives to treatment. Questions regarding the procedure were encouraged and answered. The right neck and chest were prepped with chlorhexidine in a sterile fashion, and a sterile drape was applied covering the  operative field.  Maximum barrier sterile technique with sterile gowns and gloves were used for the procedure.  A timeout was performed prior to the initiation of the procedure.  After creating a small venotomy incision, a micropuncture kit was utilized to access the right internal jugular vein under direct, real-time ultrasound guidance after the overlying soft tissues were anesthetized with 1% lidocaine with epinephrine.  Ultrasound image documentation was performed.  The microwire was kinked to measure appropriate catheter length. With the use of a Kumpe catheter, a stiff glidewire was advanced to the level of the IVC. Limited contrast injection confirmed appropriate positioning within the IVC.  Under fluoroscopic guidance, the venotomy was serially dilated ultimately allowing placement of a peel-away sheath.  A Hemosplit tunneled hemodialysis catheter measuring 19 cm from tip to cuff was tunneled in a retrograde fashion from the anterior chest wall to the venotomy incision.  The catheter was then placed through the peel-away sheath with tips ultimately positioned within the superior aspect of the right atrium.  Final catheter positioning was confirmed and documented with a spot radiographic image.  The catheter aspirates and flushes normally.  The catheter was flushed with appropriate volume heparin dwells.  The catheter exit site was secured with a 0-Prolene retention suture.  The venotomy incision was closed with an interrupted 4-0 Vicryl, Dermabond and Steri-strips.  Dressings were applied.  The patient tolerated the procedure well without immediate post procedural complication.  IMPRESSION:  Successful placement of 19 cm tip to cuff tunneled hemodialysis catheter via the right internal jugular vein with tips terminating within the superior aspect of the right atrium.  The catheter is ready for immediate use.  Original Report Authenticated By: Waynard Reeds, M.D.     Medications:        .  amLODipine  10 mg Oral Daily  . carvedilol  25 mg Oral BID WC  . clonazePAM  0.5 mg Oral BID  . darbepoetin  60 mcg Subcutaneous Q Wed-1800  . divalproex  750 mg Oral Q8H  . feeding supplement  1 Container Oral TID BM  . ferrous sulfate  325 mg Oral TID  . furosemide  160 mg Intravenous TID  . heparin  5,000 Units Subcutaneous Q8H  . loperamide  2 mg Oral BID  . sodium chloride  3 mL Intravenous Q12H  . vancomycin  125 mg Oral QID  . DISCONTD: antiseptic oral rinse  15 mL Mouth Rinse q12n4p  . DISCONTD: chlorhexidine  15 mL Mouth Rinse BID  . 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 Creatinine at baseline.   Most issues center around GI complaints that appear to be diarrhea stools and diminished appetite. Cr CL is 18. Creatinine is decreasing to 2.4 this morning. Agree with maximizing nutritional support. She appears to have significant protein calorie malnutrition. My bias would be to institute enteral feeding and / or TNA.    LOS: 5 Heather Sims W @TODAY @11 :06 AM

## 2011-10-14 NOTE — Care Management Note (Signed)
  Page 1 of 1   10/16/2011     10:04:29 AM   CARE MANAGEMENT NOTE 10/16/2011  Patient:  Heather Sims, Heather Sims   Account Number:  [id number]  Date Initiated:  10/14/2011  Documentation initiated by:  Ronny Flurry  Subjective/Objective Assessment:   Jan 2013 h/x of anoxic brain injury    HD access placed this admission . Patient has not started HD yet     Action/Plan:   Pt lives with mother Heather Sims [phone] who is pt main caregiver   Anticipated DC Date:  10/16/2011   Anticipated DC Plan:  HOME W HOME HEALTH SERVICES      DC Planning Services  CM consult      Choice offered to / List presented to:  C-6 Parent        HH arranged  HH-1 RN  HH-2 PT      [facility] agency  [facility]   Status of service:  Completed, signed off Medicare Important Message given?   (If response is "NO", the following Medicare IM given date fields will be blank) Date Medicare IM given:   Date Additional Medicare IM given:    Discharge Disposition:    Per UR Regulation:  Reviewed for med. necessity/level of care/duration of stay  If discussed at Long Length of Stay Meetings, dates discussed:    Comments:  10-14-11 Spoke with patient's mother , HHPT was recommended. Medicaid does not cover HHPT with patient's dx, however, does cover HHRN. Patient's mom Heather Sims agreeable with HHRN.   Darreld Mclean would like [facility] if [facility] is ordered. Spoke with DR Street. He will discuss with team and decide on home health. Ronny Flurry RN [name] [phone]

## 2011-10-15 LAB — RENAL FUNCTION PANEL
Albumin: 1.4 g/dL — ABNORMAL LOW (ref 3.5–5.2)
CO2: 22 mEq/L (ref 19–32)
Calcium: 8.2 mg/dL — ABNORMAL LOW (ref 8.4–10.5)
GFR calc Af Amer: 32 mL/min — ABNORMAL LOW (ref >90)
GFR calc non Af Amer: 28 mL/min — ABNORMAL LOW (ref >90)
Phosphorus: 5.9 mg/dL — ABNORMAL HIGH (ref 2.3–4.6)
Sodium: 136 mEq/L (ref 135–145)

## 2011-10-15 MED ORDER — FUROSEMIDE 80 MG PO TABS
160.0000 mg | ORAL_TABLET | Freq: Two times a day (BID) | ORAL | Status: DC
  Administered 2011-10-15 – 2011-10-16 (×3): 160 mg via ORAL
  Filled 2011-10-15 (×5): qty 2

## 2011-10-15 NOTE — Progress Notes (Signed)
[facility] ROUNDING NOTE   Subjective:   Eating breakfast and drinking coffee by herself  Objective:  Vital signs in last 24 hours:  Temp:  [97.5 F (36.4 C)-98.7 F (37.1 C)] 97.5 F (36.4 C) (07/25 0606) Pulse Rate:  [73-82] 82  (07/25 0606) Resp:  [16-18] 18  (07/25 0606) BP: (102-130)/(73-95) 130/95 mmHg (07/25 0606) SpO2:  [93 %-97 %] 93 % (07/25 0606)  Weight change:  Filed Weights   10/11/11 0702 10/12/11 0523 10/13/11 0433  Weight: 60 kg (132 lb 4.4 oz) 59.8 kg (131 lb 13.4 oz) 60.2 kg (132 lb 11.5 oz)    Intake/Output: I/O last 3 completed shifts: In: 1138 [P.O.:480; I.V.:328; IV Piggyback:330] Out: 875 [Urine:875]   Intake/Output this shift:     CVS- RRR Right IJ RS- CTA ABD- Distended EXT- no edema   Basic Metabolic Panel:  Lab 10/15/11 0865 10/14/11 0720 10/13/11 0555 10/12/11 1230 10/12/11 0535 10/11/11 0535  NA 136 137 139 -- 140 141  K 3.8 3.7 3.4* -- 3.1* 3.1*  CL 101 101 102 -- 102 102  CO2 22 22 22  -- 23 24  GLUCOSE 79 84 85 -- 94 83  BUN 45* 45* 47* -- 47* 50*  CREATININE 2.37* 2.45* 2.63* 2.63* 3.00* --  CALCIUM 8.2* 8.2* 8.0* -- -- --  MG -- -- -- -- -- --  PHOS 5.9* 6.1* 5.9* -- 6.1* 6.6*    Liver Function Tests:  Lab 10/15/11 0524 10/14/11 0720 10/13/11 0555 10/12/11 0535 10/11/11 0535 10/09/11 1918  AST -- -- -- -- -- 22  ALT -- -- -- -- -- <5  ALKPHOS -- -- -- -- -- 37*  BILITOT -- -- -- -- -- 0.1*  PROT -- -- -- -- -- 6.5  ALBUMIN 1.4* 1.4* 1.5* 1.5* 1.4* --   No results found for this basename: LIPASE:5,AMYLASE:5 in the last 168 hours No results found for this basename: AMMONIA:3 in the last 168 hours  CBC:  Lab 10/12/11 0535 10/10/11 1426 10/10/11 0930 10/09/11 1918  WBC 9.6 11.4* 12.2* 9.3  NEUTROABS -- -- -- 7.0  HGB 7.9* 9.4* 7.7* 9.5*  HCT 23.6* 28.0* 23.4* 28.0*  MCV 94.4 95.2 95.9 95.6  PLT 135* 124* 129* 157    Cardiac Enzymes: No results found for this basename:  CKTOTAL:5,CKMB:5,CKMBINDEX:5,TROPONINI:5 in the last 168 hours  BNP: No components found with this basename: POCBNP:5  CBG: No results found for this basename: GLUCAP:5 in the last 168 hours  Microbiology: Results for orders placed during the hospital encounter of 10/09/11  CULTURE, BLOOD (ROUTINE X 2)     Status: Normal (Preliminary result)   Collection Time   10/10/11 12:05 AM      Component Value Range Status Comment   Specimen Description BLOOD HAND RIGHT   Final    Special Requests BOTTLES DRAWN AEROBIC ONLY 1CC   Final    Culture  Setup Time 10/10/2011 05:08   Final    Culture     Final    Value:        BLOOD CULTURE RECEIVED NO GROWTH TO DATE CULTURE WILL BE HELD FOR 5 DAYS BEFORE ISSUING A FINAL NEGATIVE REPORT   Report Status PENDING   Incomplete   CULTURE, BLOOD (ROUTINE X 2)     Status: Normal (Preliminary result)   Collection Time   10/10/11  1:05 AM      Component Value Range Status Comment   Specimen Description BLOOD RIGHT ARM   Final  Special Requests BOTTLES DRAWN AEROBIC ONLY 1CC   Final    Culture  Setup Time 10/10/2011 11:34   Final    Culture     Final    Value:        BLOOD CULTURE RECEIVED NO GROWTH TO DATE CULTURE WILL BE HELD FOR 5 DAYS BEFORE ISSUING A FINAL NEGATIVE REPORT   Report Status PENDING   Incomplete   CLOSTRIDIUM DIFFICILE BY PCR     Status: Normal   Collection Time   10/10/11 12:06 PM      Component Value Range Status Comment   C difficile by pcr NEGATIVE  NEGATIVE Final   URINE CULTURE     Status: Normal   Collection Time   10/10/11 12:07 PM      Component Value Range Status Comment   Specimen Description URINE, CATHETERIZED   Final    Special Requests ADDED AT 1600   Final    Culture  Setup Time 10/10/2011 20:28   Final    Colony Count NO GROWTH   Final    Culture NO GROWTH   Final    Report Status 10/11/2011 FINAL   Final   CLOSTRIDIUM DIFFICILE BY PCR     Status: Normal   Collection Time   10/13/11  2:00 PM      Component Value  Range Status Comment   C difficile by pcr NEGATIVE  NEGATIVE Final     Coagulation Studies: No results found for this basename: LABPROT:5,INR:5 in the last 72 hours  Urinalysis: No results found for this basename: COLORURINE:2,APPERANCEUR:2,LABSPEC:2,PHURINE:2,GLUCOSEU:2,HGBUR:2,BILIRUBINUR:2,KETONESUR:2,PROTEINUR:2,UROBILINOGEN:2,NITRITE:2,LEUKOCYTESUR:2 in the last 72 hours    Imaging: No results found.   Medications:        . amLODipine  10 mg Oral Daily  . carvedilol  25 mg Oral BID WC  . clonazePAM  0.5 mg Oral BID  . darbepoetin  60 mcg Subcutaneous Q Wed-1800  . divalproex  750 mg Oral Q8H  . feeding supplement  1 Container Oral TID BM  . ferrous sulfate  325 mg Oral TID  . furosemide  160 mg Oral BID  . heparin  5,000 Units Subcutaneous Q8H  . loperamide  2 mg Oral BID  . sodium chloride  3 mL Intravenous Q12H  . DISCONTD: furosemide  160 mg Intravenous TID  . 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 Creatinine baseline 2.3    Most issues center around GI complaints that appear to be diarrhea stools and diminished appetite. Her diarrhea is improving. Cr CL is 18. Creatinine continues decreasing . Agree with maximizing nutritional support. She appears to have significant protein calorie malnutrition.  Transition to oral lasix and will need permcath removed prior to discharge.   LOS: 6 Sharlynn Seckinger W @TODAY @10 :46 AM

## 2011-10-15 NOTE — Progress Notes (Signed)
Family Medicine Teaching Service Daily Progress Note Service Page: [phone]  Subjective: Pt sleeping while entering room.  Long discussion with mom about where to go from here regarding SNF vs home health.  Also, GI consulted to help evaluate her malnutrition for future management as well as diarrheal causes/tx.  Mother would feel better staying one more day in hospital to have everything ironed out.  Objective: Temp:  [97.5 F (36.4 C)-98.7 F (37.1 C)] 97.5 F (36.4 C) (07/25 0606) Pulse Rate:  [73-82] 82  (07/25 0606) Resp:  [16-18] 18  (07/25 0606) BP: (102-130)/(73-95) 130/95 mmHg (07/25 0606) SpO2:  [93 %-97 %] 93 % (07/25 0606) Exam: General: NAD, lying in bed, sleeping pleasantly Cardiovascular: RRR, 2/6 systolic murmur Respiratory: CTABL Abdomen: Distended but non-tender.  BS present Extremities: No edema, 2+ pulses. Neuro: Pt has trouble with conversation. Was eating by herself today upon walking into the room   CBC BMET   Lab 10/12/11 0535 10/10/11 1426 10/10/11 0930  WBC 9.6 11.4* 12.2*  HGB 7.9* 9.4* 7.7*  HCT 23.6* 28.0* 23.4*  PLT 135* 124* 129*    24 HR CREATININE - 679 (Normal [phone])  Calculated CREA CLEARANCE - 18 (Normal 90-125)  Lab 10/15/11 0524 10/14/11 0720 10/13/11 0555  NA 136 137 139  K 3.8 3.7 3.4*  CL 101 101 102  CO2 22 22 22   BUN 45* 45* 47*  CREATININE 2.37* 2.45* 2.63*  GLUCOSE 79 84 85  CALCIUM 8.2* 8.2* 8.0*     STOOL PCR C. DIFF - 1st sent negative.  10/13/11 - as well   Imaging/Diagnostic Tests: 2D Echo 10/12/11 Left ventricle: The cavity size was normal. There was moderate concentric hypertrophy.   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 Diff who presents with nausea/vomiting and diarrhea for approx one week, with frank dehydration.    1. Diarrhea: Still with loose stools but C.Diff PCR  negative 1. Vanc taper has been restarted from highest dose.  Plan for standard taper at this point. 2. No Clinical S/Sx of ongoing C. Diff.  2 x PCR negative for C Diff stool, d/c Vanc at this time.  On loperamide bid 4 mg which helped decrease her diarrhea yesterday 3. GI to evaluate today for further recommendations and to discuss with mother 2. Altered mental status: At baseline, starting to improve 3. 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 1. Will transition to oral lasix per renal along with removing permcath 2. Appreciate renal input.   3. Crea clearance is 18 (Good estimation of GFR but does tend to overestimate by 10-20 % due to tubular secretion of crea).  In agreement with Dr. Hyman Hopes that Dialysis to be held off as Crea has trended down to baseline of around 2.3. 4. PT recommends 24 hour supervision.  OT no recommendations 4. HTN: normotensive overnight.  On home meds. 5. Seizure Disorder: On Depakote.  No seizures at this time. 6. Cardiomyopathy: Not an active issue but does have history of acute systolic heart failure.  This was in the setting of being acutely post-arrest.  Is not in heart failure at this time.  ECHO shows some concentric hypertrophy but no evidence of Heart Failure at this time as EF > 60% and LVEDV only slightly elevated. 7. Protein Calorie Malnutrition: Will advance diet and will likely add ensure shakes as tolerated.  Will have GI assess today for any further recommendations regarding nutrition in the future as well as possible PEG tube in future for nutrtion.  However, pt was eating on own today including eggs and potatoes. 1. Discussed with mom at length today about possible SNF vs home health with RN.  Mom at this point would like to have RN at home to help with taking care of her.  I think this is reasonable as pt and mother seem to mirror each other in regard to their health.  Mother's expectations at this point are to  have pt be able to dress herself, cook for herself at times, get around in her wheelchair, which she was able to do about 2 months ago before the C Diff infection.  The most ideal situation is be able to have help getting pt back to this baseline so that mom can continue to take good care of her when she is stable.  Will continue to discuss with Dr. Hyman Hopes and Dr. Jennette Kettle in regards to where we go from here for d/c  8. Hypocalcemia/hyperphosphatemia: Per renal 9. Hypokalemia: K+ 3.8 today.  Will continue to monitor.   10. FEN/GI: ADAT 11. PPx: SHQ 12. Dispo: floor status, will need to further monitor renal status before ready for d/c  Gildardo Cranker, DO 10/15/2011, 11:50 AM

## 2011-10-15 NOTE — Discharge Summary (Signed)
Physician Discharge Summary  Patient ID: Heather Sims MRN: [id number] DOB: [birth year] Age: 21 y.o.  Admit date: 10/09/2011 Discharge date: 10/15/2011 Admitting Physician: Sanjuana Letters, MD  PCP: Jaclyn Shaggy, MD  Consultants:Gastroenterology, Nephrology, Interventional Radiology     Discharge Diagnosis: Active Problems:  Hypertension  Seizures  Anoxic brain damage  CKD (chronic kidney disease)  MPGN (membranoproliferative glomerulonephritis), type 2  Acute renal failure  Dehydration  C. difficile colitis    Hospital Course Pt is a 21yo with PMH significant for anoxic brain injury in January secondary to cardio-respiratory failure from pulmonary edema secondary to acute renal failure from C3 Nephritis, hx seizures, cardiomyopathy, CKD IV, HTN, and recurrent C. Diff who presents with diarrhea, n/v for several days.  1) Diarrhea: Pt has a Hx of C Diff infection in May 2013.  She was treated with oral Flagyl and the infection cleared.  She started to have Sx very similar to her previous episode around 2 weeks ago, in which she was treated in another hospital stay with Oral Vanc at this point.  At the time of admission, a PCR stool C Diff was obtained and was negative.  1)Vanc taper was restarted from highest dose.   2) Pt continued to have around 3 episodes of loose stools per day during her stay, so another PCR stool C Diff was sent on HD #5, which was also negative  3) At this time, pt was stopped on the oral vancomycin and was given Imodium bid which helped improve her episodes  4)GI was also consulted at this time to evaluate her diarrhea and her overall status for possible PEG tube placement.  They recommend outpt f/u and no further management by them at this time  2)AKI on CKD from C3 nephropathy - AKI secondary to dehydration.  Pt BUN on admission was 51 and Crea was 3.93 (Baseline 2.3). A UA was negative for acute processes, a FENa was 0.5% signaling pre-renal  azotemia.  Bx previously done this year showed C3 nephoropathy in which Dr. Caryn Section has been following as an outpt. 1. Pt was started on IVF, was watched, and renal was consulted for further management 2. Renal recommended IV lasix as pt had Sx of ascites and mild LE edema.  They also recommended a tunnel cath placement for emergent HD.  Further, they thought she may need an AV fistula placed for permanent dialysis. Upon further investigation, a creatinine clearance was done showing it to be 18 (Normal 90-125) but at this point, they want to hold off on permanent HD due to the fact she is 21 years old and most likely will need it in the future. 3. Her Crea continued to trend down and was around 2.3 on d/c.  3) HTN: Pt had episodes of HTN prior to admission.  She was normotensive on admission and was continued on her home medications except for losartan due to her elevated Cr.  Her BP was stable throughout her stay and was restarted on her home meds before d/c.  4) Seizure Disorder: On Depakote. No seizures at this time.  5)Cardiomyopathy: Not an active issue but does have history of acute systolic heart failure. This was in the setting of being acutely post-arrest. Was not in heart failure at this time. An echo was performed on 7/21 showing concentric hypertrophy but no active heart failure.  6)Protein Calorie Malnutrition- Diet was advanced throughout the stay.  1) Nutrition consult was placed due to chronic wasting and recommended ensure be added  to the diet  2)GI was consulted to discuss with mother the placement of a PEG tube for further nutrition in the outpt setting   3) Her diet improved throughout her stay, but the possibility for this happening again was a concern.  4) Discussion with mom about possible home health with RN vs SNF for help of taking care of her and to prevent this decreased oral intake from happening again which could have contributed to her kidney injury and diarrhea/infection.   Will be d/c with home health RN and PT.  7)Hypocalcemia/hyperphosphatemia: Started to trend down in the hospital course and may need binders in the future per renal.  8)Hypokalemia: Pt found to be hypokalemic on day #4 with K+ of 3.1.  Was given two doses of Kcl PO 20 meq and improved to 3.7 before d/c.        Discharge PE   Filed Vitals:   10/16/11 1358  BP: 122/87  Pulse: 80  Temp: 98.4 F (36.9 C)  Resp: 16   Physical Exam General: NAD, lying in bed, sleeping pleasantly  HEENT: [place]/AT, MMM Cardiovascular: RRR, 2/6 systolic murmur  Respiratory: CTABL  Abdomen: Distended but non-tender. BS present  Extremities: No edema, 2+ pulses.  Neuro: Pt has trouble with conversation. Was eating by herself today upon walking into the room    Procedures/Imaging:      Dg Abd 1 View  09/19/2011    IMPRESSION: Nonobstructive bowel gas pattern. Question ascites.  Original Report Authenticated By: Lollie Marrow, M.D.   Ir Fluoro Guide Cv Line Right  10/12/2011 IMPRESSION:  Successful placement of 19 cm tip to cuff tunneled hemodialysis catheter via the right internal jugular vein with tips terminating within the superior aspect of the right atrium.  The catheter is ready for immediate use.  Original Report Authenticated By: Waynard Reeds, M.D.   Ir US Guide Vasc Access Right  10/12/2011    IMPRESSION:  Successful placement of 19 cm tip to cuff tunneled hemodialysis catheter via the right internal jugular vein with tips terminating within the superior aspect of the right atrium.  The catheter is ready for immediate use.  Original Report Authenticated By: Waynard Reeds, M.D.   Dg Abd 2 Views  10/11/2011   IMPRESSION:  1.  Extensive ascites. 2.  No evidence of bowel obstruction or other acute abnormality.  Original Report Authenticated By: Gwynn Burly, M.D.    Labs  CBC  Lab 10/12/11 0535 10/10/11 1426 10/10/11 0930  WBC 9.6 11.4* 12.2*  HGB 7.9* 9.4* 7.7*  HCT 23.6* 28.0* 23.4*    PLT 135* 124* 129*   BMET  Lab 10/16/11 0645 10/15/11 0524 10/14/11 0720 10/09/11 1918  NA 136 136 137 --  K 4.0 3.8 3.7 --  CL 101 101 101 --  CO2 23 22 22  --  BUN 44* 45* 45* --  CREATININE 2.29* 2.37* 2.45* --  CALCIUM 8.4 8.2* 8.2* --  PROT -- -- -- 6.5  BILITOT -- -- -- 0.1*  ALKPHOS -- -- -- 37*  ALT -- -- -- <5  AST -- -- -- 22  GLUCOSE 85 79 84 --   Results for orders placed during the hospital encounter of 10/09/11 (from the past 72 hour(s))  CLOSTRIDIUM DIFFICILE BY PCR     Status: Normal   Collection Time   10/13/11  2:00 PM      Component Value Range Comment   C difficile by pcr NEGATIVE  NEGATIVE  RENAL FUNCTION PANEL     Status: Abnormal   Collection Time   10/14/11  7:20 AM      Component Value Range Comment   Sodium 137  135 - 145 mEq/L    Potassium 3.7  3.5 - 5.1 mEq/L    Chloride 101  96 - 112 mEq/L    CO2 22  19 - 32 mEq/L    Glucose, Bld 84  70 - 99 mg/dL    BUN 45 (*) 6 - 23 mg/dL    Creatinine, Ser 0.34 (*) 0.50 - 1.10 mg/dL    Calcium 8.2 (*) 8.4 - 10.5 mg/dL    Phosphorus 6.1 (*) 2.3 - 4.6 mg/dL    Albumin 1.4 (*) 3.5 - 5.2 g/dL    GFR calc non Af Amer 27 (*) >90 mL/min    GFR calc Af Amer 31 (*) >90 mL/min   RENAL FUNCTION PANEL     Status: Abnormal   Collection Time   10/15/11  5:24 AM      Component Value Range Comment   Sodium 136  135 - 145 mEq/L    Potassium 3.8  3.5 - 5.1 mEq/L    Chloride 101  96 - 112 mEq/L    CO2 22  19 - 32 mEq/L    Glucose, Bld 79  70 - 99 mg/dL    BUN 45 (*) 6 - 23 mg/dL    Creatinine, Ser 7.42 (*) 0.50 - 1.10 mg/dL    Calcium 8.2 (*) 8.4 - 10.5 mg/dL    Phosphorus 5.9 (*) 2.3 - 4.6 mg/dL    Albumin 1.4 (*) 3.5 - 5.2 g/dL    GFR calc non Af Amer 28 (*) >90 mL/min    GFR calc Af Amer 32 (*) >90 mL/min   RENAL FUNCTION PANEL     Status: Abnormal   Collection Time   10/16/11  6:45 AM      Component Value Range Comment   Sodium 136  135 - 145 mEq/L    Potassium 4.0  3.5 - 5.1 mEq/L    Chloride 101  96 -  112 mEq/L    CO2 23  19 - 32 mEq/L    Glucose, Bld 85  70 - 99 mg/dL    BUN 44 (*) 6 - 23 mg/dL    Creatinine, Ser 5.95 (*) 0.50 - 1.10 mg/dL    Calcium 8.4  8.4 - 63.8 mg/dL    Phosphorus 5.5 (*) 2.3 - 4.6 mg/dL    Albumin 1.5 (*) 3.5 - 5.2 g/dL    GFR calc non Af Amer 29 (*) >90 mL/min    GFR calc Af Amer 34 (*) >90 mL/min        Patient condition at time of discharge/disposition: stable  Disposition-home   Follow up issues: 1. Please follow up with patient on her oral intake.  This is a chronic concern due to her chronic protein malnutrition and most likely dehydration causing her AKI.    2.  Please see how patient is doing regarding her diarrhea.  She was given imodium PRN on d/c.  3. Please follow up with pt on following renal for future recommendations regarding her CKD.  4. Please see how mom and pt are doing with home PT/RN.  Discharge follow up:  Follow-up Information    Follow up with [name], MD. (Please call [phone] to make an apointment with Dr. Mayford Knife in the next week)    Contact information:   [phone]  [address]  [place] [address] [phone]          Discharge Instructions: Please refer to Patient Instructions section of EMR for full details.  Patient was counseled important signs and symptoms that should prompt return to medical care, changes in medications, dietary instructions, activity restrictions, and follow up appointments.  Significant instructions noted below:     Discharge Medications Medication List  As of 10/16/2011  2:15 PM   START taking these medications         loperamide 1 MG/5ML solution   Commonly known as: IMODIUM   Take 10 mLs (2 mg total) by mouth 2 (two) times daily.         CHANGE how you take these medications         furosemide 80 MG tablet   Commonly known as: LASIX   Take 2 tablets (160 mg total) by mouth 2 (two) times daily.   What changed: - dose - how often to take the med          CONTINUE taking these medications         amLODipine 10 MG tablet   Commonly known as: NORVASC      carvedilol 25 MG tablet   Commonly known as: COREG      clonazePAM 0.5 MG tablet   Commonly known as: KLONOPIN      darbepoetin 60 MCG/0.3ML Soln   Commonly known as: ARANESP      divalproex 250 MG DR tablet   Commonly known as: DEPAKOTE      ferrous sulfate 325 (65 FE) MG tablet      losartan 100 MG tablet   Commonly known as: COZAAR   Take 1 tablet (100 mg total) by mouth daily.      sodium bicarbonate 650 MG tablet   Take 1 tablet (650 mg total) by mouth 3 (three) times daily.         STOP taking these medications         vancomycin 50 mg/mL oral solution          Where to get your medications    These are the prescriptions that you need to pick up. We sent them to a specific pharmacy, so you will need to go there to get them.   [id number] - [place] ([place]), [place] - [address]    [address] [place] ([place]) [place] [address]    Phone: [phone]        furosemide 80 MG tablet   loperamide 1 MG/5ML solution                Gildardo Cranker, DO of [facility] 10/15/2011 12:06 PM

## 2011-10-15 NOTE — Progress Notes (Signed)
Physical Therapy Treatment Patient Details Name: Heather Sims MRN: [id number] DOB: [birth year] Today's Date: 10/15/2011 Time: 6213-0865 PT Time Calculation (min): 17 min  PT Assessment / Plan / Recommendation Comments on Treatment Session  Patient agreeable to therapy however fatiqies quickly. Patient lying in "froglike" position with LEs upon arrival and LEs were tight into flexion. Encouraged patient to stretch out legs into extension to prevent contractures and assist in standing. Patient and family in agreeance.     Follow Up Recommendations  Home health PT;Supervision/Assistance - 24 hour    Barriers to Discharge        Equipment Recommendations  None recommended by OT;None recommended by PT    Recommendations for Other Services    Frequency Min 3X/week   Plan Discharge plan remains appropriate;Frequency remains appropriate    Precautions / Restrictions Precautions Precautions: Fall Restrictions Weight Bearing Restrictions: No   Pertinent Vitals/Pain     Mobility  Bed Mobility Rolling Right: 4: Min assist;With rail Right Sidelying to Sit: 3: Mod assist;With rails Sit to Sidelying Right: 1: +2 Total assist Sit to Sidelying Right: Patient Percentage: 60% Details for Bed Mobility Assistance: A to facilitate positioning into sitting and to ensure correct technique. Cues for hip placement. A back into bed by guiding LEs and assisting shouders back into bed. Cues throughout for positioning Transfers Sit to Stand: 1: +2 Total assist;From bed;With upper extremity assist Sit to Stand: Patient Percentage: 60% Stand to Sit: To bed;With upper extremity assist;1: +2 Total assist Stand to Sit: Patient Percentage: 60% Details for Transfer Assistance: A for initiation of stand. Patient utilizes rocking technique with stands. First stand patient with widen BOS and posterior lean, Second stand patient able to narrow BOS and stand more upright. Patient with flexed knees and hips in  standing Ambulation/Gait Ambulation/Gait Assistance: Not tested (comment)    Exercises     PT Diagnosis:    PT Problem List:   PT Treatment Interventions:     PT Goals Acute Rehab PT Goals PT Goal: Rolling Supine to Right Side - Progress: Progressing toward goal PT Goal: Rolling Supine to Left Side - Progress: Progressing toward goal PT Goal: Supine/Side to Sit - Progress: Progressing toward goal PT Goal: Sit to Supine/Side - Progress: Progressing toward goal PT Goal: Sit to Stand - Progress: Progressing toward goal PT Goal: Stand to Sit - Progress: Progressing toward goal  Visit Information  Last PT Received On: 10/15/11 Assistance Needed: +2    Subjective Data      Cognition  Overall Cognitive Status: History of cognitive impairments - at baseline Arousal/Alertness: Awake/alert Orientation Level: Appears intact for tasks assessed Behavior During Session: [facility] for tasks performed Cognition - Other Comments: slow processing    Balance  Static Sitting Balance Static Sitting - Balance Support: Bilateral upper extremity supported;Feet supported Static Sitting - Level of Assistance: 4: Min assist Static Sitting - Comment/# of Minutes: 5  End of Session PT - End of Session Equipment Utilized During Treatment: Gait belt Activity Tolerance: Patient limited by fatigue Patient left: in bed;with call bell/phone within reach;with family/visitor present   GP     Robinette, Adline Potter 10/15/2011, 2:29 PM 10/15/2011 Fredrich Birks PTA [phone] pager [phone] office

## 2011-10-15 NOTE — Consult Note (Signed)
Subjective:   HPI  The patient is a 21 year old female with a history of anoxic brain damage. She recently suffered an episode of Clostridium difficile colitis and has undergone treatment for this and now it appears there is resolution of the Clostridium difficile based on most recent PCR testing. She was still having some ongoing diarrhea but according to the mother who is with the patient today and is providing the history, she tells me that Imodium does seem to be stopping the diarrhea now. The mother states she's not really concerned about the diarrhea at this point since the Imodium is helping.  Another concern was raised was about the patient's nutritional status. According to the mother she ate all of her breakfast today, and between 50-75% of her lunch. She does not like the hospital food according to her mother and eats better when she goes home. There was some concern by the resident in charge of her case here in the hospital as to whether or not she might need a feeding tube when I spoke to him.  Review of Systems Unable to obtain  Past Medical History  Diagnosis Date  . Pneumonia     'walking pneumonia'  . Seizures   . Anemia   . Heart attack   . Cardiomyopathy   . Heart failure   . CHF (congestive heart failure)   . Ileitis   . Chronic kidney disease   . Renal disorder   . MPGN (membranoproliferative glomerulonephritis), type 2   . Hypertension   . Brain injury    Past Surgical History  Procedure Date  . Renal biopsy    History   Social History  . Marital Status: Single    Spouse Name: N/A    Number of Children: N/A  . Years of Education: N/A   Occupational History  . Not on file.   Social History Main Topics  . Smoking status: Never Smoker   . Smokeless tobacco: Never Used  . Alcohol Use: No  . Drug Use: No  . Sexually Active: No   Other Topics Concern  . Not on file   Social History Narrative   ** Merged History Encounter **    family history  includes Diabetes in her father and Hypertension in her mother. Current facility-administered medications:0.9 %  sodium chloride infusion, 250 mL, Intravenous, PRN, Stephanie Coup Street, MD;  acetaminophen (TYLENOL) suppository 650 mg, 650 mg, Rectal, Q6H PRN, Stephanie Coup Street, MD;  acetaminophen (TYLENOL) tablet 650 mg, 650 mg, Oral, Q6H PRN, Stephanie Coup Street, MD;  amLODipine (NORVASC) tablet 10 mg, 10 mg, Oral, Daily, Stephanie Coup [address], MD, 10 mg at 10/15/11 1017 carvedilol (COREG) tablet 25 mg, 25 mg, Oral, BID WC, Stephanie Coup Street, MD, 25 mg at 10/15/11 1610;  clonazePAM [facility]) tablet 0.5 mg, 0.5 mg, Oral, BID, Stephanie Coup Street, MD, 0.5 mg at 10/15/11 1017;  darbepoetin (ARANESP) injection 60 mcg, 60 mcg, Subcutaneous, Q Wed-1800, Stephanie Coup Street, MD, 60 mcg at 10/14/11 1746 divalproex (DEPAKOTE SPRINKLE) capsule 750 mg, 750 mg, Oral, Q8H, [address] PHARMD, 750 mg at 10/15/11 1331;  feeding supplement (RESOURCE BREEZE) liquid 1 Container, 1 Container, Oral, TID BM, Haynes Bast, RD, 1 Container at 10/15/11 1017;  ferrous sulfate tablet 325 mg, 325 mg, Oral, TID, Stephanie Coup Street, MD, 325 mg at 10/15/11 1017 furosemide (LASIX) tablet 160 mg, 160 mg, Oral, BID, Garnetta Buddy, MD, 160 mg at 10/15/11 1330;  heparin injection 1,200 Units, 20 Units/kg,  Dialysis, PRN, Maree Krabbe, MD;  heparin injection 5,000 Units, 5,000 Units, Subcutaneous, Q8H, Stephanie Coup Street, MD, 5,000 Units at 10/15/11 1331;  loperamide (IMODIUM) 1 MG/5ML solution 2 mg, 2 mg, Oral, BID, Bryan R Hess, DO, 2 mg at 10/15/11 1017 ondansetron (ZOFRAN) injection 4 mg, 4 mg, Intravenous, Q4H PRN, Stephanie Coup Street, MD;  ondansetron [facility]) tablet 4 mg, 4 mg, Oral, Q4H PRN, Stephanie Coup Street, MD;  sodium chloride 0.9 % injection 3 mL, 3 mL, Intravenous, Q12H, Stephanie Coup Street, MD, 3 mL at 10/15/11 1017;  sodium chloride 0.9 % injection 3 mL, 3 mL, Intravenous, PRN,  Stephanie Coup Street, MD DISCONTD: furosemide (LASIX) 160 mg in dextrose 5 % 50 mL IVPB, 160 mg, Intravenous, TID, Maree Krabbe, MD, 160 mg at 10/14/11 2212 No Known Allergies   Objective:     BP 123/88  Pulse 80  Temp 98.5 F (36.9 C) (Oral)  Resp 18  Ht 5\' 2"  (1.575 m)  Wt 60.2 kg (132 lb 11.5 oz)  BMI 24.27 kg/m2  SpO2 97%  LMP 10/09/2011  Unable to provide any history  Does not appear in any distress.  Heart regular rhythm  Lungs clear  Abdomen with bowel sounds present, soft, nontender  Laboratory No components found with this basename: d1      Assessment:     #1 anoxic brain injury  #2 recent diarrhea with history of recent Clostridium difficile colitis which has subsequently resolved and recent PCR testing negative. Diarrhea now seems to be controlled with Imodium according to the mother.  #3 question on nutritional assessment/the patient's mother reported that she ate all of her breakfast today in between 50 and 75% of her lunch. She reports that she doesn't like the hospital food and needs better when she goes home.      Plan:     Imodium for diarrhea when necessary  It does not appear at this time from my initial assessment that she needs anything in regards to nutrition. Based on my conversation with the mother I see no indication for placement of a feeding tube.  Further assessment can be made after discharge in regards to whether or not she has recurrence of diarrhea or worsening of her caloric intake.

## 2011-10-15 NOTE — Progress Notes (Signed)
FMTS Attending Daily Note: Bryonna Sundby MD [phone] pager office [phone] I  have seen and examined this patient, reviewed their chart. I have discussed this patient with the resident. I agree with the resident's findings, assessment and care plan. 

## 2011-10-16 ENCOUNTER — Inpatient Hospital Stay (HOSPITAL_COMMUNITY): Payer: Medicaid Other

## 2011-10-16 LAB — CULTURE, BLOOD (ROUTINE X 2): Culture: NO GROWTH

## 2011-10-16 LAB — RENAL FUNCTION PANEL
Albumin: 1.5 g/dL — ABNORMAL LOW (ref 3.5–5.2)
Calcium: 8.4 mg/dL (ref 8.4–10.5)
Creatinine, Ser: 2.29 mg/dL — ABNORMAL HIGH (ref 0.50–1.10)
GFR calc non Af Amer: 29 mL/min — ABNORMAL LOW (ref >90)

## 2011-10-16 MED ORDER — LOPERAMIDE HCL 1 MG/5ML PO LIQD: 2.0000 mg | Freq: Two times a day (BID) | ORAL | Status: AC

## 2011-10-16 MED ORDER — FUROSEMIDE 80 MG PO TABS: 160.0000 mg | ORAL_TABLET | Freq: Two times a day (BID) | ORAL | Status: DC

## 2011-10-16 MED FILL — Dextrose Inj 5%: INTRAVENOUS | Qty: 50 | Status: AC

## 2011-10-16 MED FILL — Furosemide Inj 10 MG/ML: INTRAMUSCULAR | Qty: 16 | Status: AC

## 2011-10-16 MED FILL — Clonazepam Tab 0.5 MG: ORAL | Qty: 1 | Status: AC

## 2011-10-16 MED FILL — Heparin Sodium (Porcine) Inj 5000 Unit/ML: INTRAMUSCULAR | Qty: 1 | Status: AC

## 2011-10-16 MED FILL — Carvedilol Tab 25 MG: ORAL | Qty: 1 | Status: AC

## 2011-10-16 MED FILL — Divalproex Sodium Cap Sprinkle 125 MG: ORAL | Qty: 6 | Status: AC

## 2011-10-16 MED FILL — Firvanq - vanc oral solution 50mg/mL: ORAL | Qty: 2.5 | Status: AC

## 2011-10-16 MED FILL — Ferrous Fumarate Tab 324 MG (106 MG Elemental Fe): ORAL | Qty: 1 | Status: AC

## 2011-10-16 NOTE — Progress Notes (Signed)
Tunneled HD catheter removed at bedside today. Proper protocol and sterile technique utilized. No complications and pt tolerated well. Sterile occlusive dressing intact.  See Radiology report for full procedure details.  Brayton El PA-C .10/16/2011 11:25 AM

## 2011-10-16 NOTE — Progress Notes (Signed)
Patient ID: Heather Sims, female   DOB: 03-21-1991, 21 y.o.   MRN: [id number]   Request per Dr Hyman Hopes To remove dialysis catheter just placed 4 days ago No need for dialysis at this time  Pt and mother aware of procedure Consent in chart

## 2011-10-16 NOTE — Discharge Summary (Signed)
Family Medicine Teaching Service  Discharge Note : Attending Denny Levy MD Pager [phone] Office [phone] I have seen and examined this patient, reviewed their chart and discussed discharge planning wit the resident at the time of discharge. I agree with the discharge plan as above. Dr Paulina Fusi spent a long time discussing her nutritional status and future needs with Mom. Mom still plans to care for her at home and we will try to support that with additional services---appreciate SW help (Thanks [place] Ultimately Kamalei may need peg tube placement but GI does not think she needs it now. They recommended PRN loperamide for diarrhea and I would add considering addition of florastar.  Renal will follow outpatient.

## 2011-10-16 NOTE — Progress Notes (Signed)
[facility] ROUNDING NOTE   Subjective:   Interval History: appears better  Objective:  Vital signs in last 24 hours:  Temp:  [97.5 F (36.4 C)-98.5 F (36.9 C)] 97.5 F (36.4 C) (07/25 2150) Pulse Rate:  [80-88] 85  (07/26 0520) Resp:  [16-18] 16  (07/26 0520) BP: (121-123)/(75-92) 122/92 mmHg (07/26 0520) SpO2:  [93 %-97 %] 95 % (07/26 0520) Weight:  [59.2 kg (130 lb 8.2 oz)] 59.2 kg (130 lb 8.2 oz) (07/26 0520)  Weight change:  Filed Weights   10/12/11 0523 10/13/11 0433 10/16/11 0520  Weight: 59.8 kg (131 lb 13.4 oz) 60.2 kg (132 lb 11.5 oz) 59.2 kg (130 lb 8.2 oz)    Intake/Output: I/O last 3 completed shifts: In: 874 [P.O.:600; I.V.:208; IV Piggyback:66] Out: 1800 [Urine:1800]   Intake/Output this shift:     CVS- RRR Right IJ  RS- CTA  ABD- Distended  EXT- no edema    Basic Metabolic Panel:  Lab 10/16/11 1610 10/15/11 0524 10/14/11 0720 10/13/11 0555 10/12/11 1230 10/12/11 0535  NA 136 136 137 139 -- 140  K 4.0 3.8 3.7 3.4* -- 3.1*  CL 101 101 101 102 -- 102  CO2 23 22 22 22  -- 23  GLUCOSE 85 79 84 85 -- 94  BUN 44* 45* 45* 47* -- 47*  CREATININE 2.29* 2.37* 2.45* 2.63* 2.63* --  CALCIUM 8.4 8.2* 8.2* -- -- --  MG -- -- -- -- -- --  PHOS 5.5* 5.9* 6.1* 5.9* -- 6.1*    Liver Function Tests:  Lab 10/16/11 0645 10/15/11 0524 10/14/11 0720 10/13/11 0555 10/12/11 0535 10/09/11 1918  AST -- -- -- -- -- 22  ALT -- -- -- -- -- <5  ALKPHOS -- -- -- -- -- 37*  BILITOT -- -- -- -- -- 0.1*  PROT -- -- -- -- -- 6.5  ALBUMIN 1.5* 1.4* 1.4* 1.5* 1.5* --   No results found for this basename: LIPASE:5,AMYLASE:5 in the last 168 hours No results found for this basename: AMMONIA:3 in the last 168 hours  CBC:  Lab 10/12/11 0535 10/10/11 1426 10/10/11 0930 10/09/11 1918  WBC 9.6 11.4* 12.2* 9.3  NEUTROABS -- -- -- 7.0  HGB 7.9* 9.4* 7.7* 9.5*  HCT 23.6* 28.0* 23.4* 28.0*  MCV 94.4 95.2 95.9 95.6  PLT 135* 124* 129* 157    Cardiac  Enzymes: No results found for this basename: CKTOTAL:5,CKMB:5,CKMBINDEX:5,TROPONINI:5 in the last 168 hours  BNP: No components found with this basename: POCBNP:5  CBG: No results found for this basename: GLUCAP:5 in the last 168 hours  Microbiology: Results for orders placed during the hospital encounter of 10/09/11  CULTURE, BLOOD (ROUTINE X 2)     Status: Normal   Collection Time   10/10/11 12:05 AM      Component Value Range Status Comment   Specimen Description BLOOD HAND RIGHT   Final    Special Requests BOTTLES DRAWN AEROBIC ONLY 1CC   Final    Culture  Setup Time 10/10/2011 05:08   Final    Culture NO GROWTH 5 DAYS   Final    Report Status 10/16/2011 FINAL   Final   CULTURE, BLOOD (ROUTINE X 2)     Status: Normal   Collection Time   10/10/11  1:05 AM      Component Value Range Status Comment   Specimen Description BLOOD RIGHT ARM   Final    Special Requests BOTTLES DRAWN AEROBIC ONLY 1CC   Final  Culture  Setup Time 10/10/2011 11:34   Final    Culture NO GROWTH 5 DAYS   Final    Report Status 10/16/2011 FINAL   Final   CLOSTRIDIUM DIFFICILE BY PCR     Status: Normal   Collection Time   10/10/11 12:06 PM      Component Value Range Status Comment   C difficile by pcr NEGATIVE  NEGATIVE Final   URINE CULTURE     Status: Normal   Collection Time   10/10/11 12:07 PM      Component Value Range Status Comment   Specimen Description URINE, CATHETERIZED   Final    Special Requests ADDED AT 1600   Final    Culture  Setup Time 10/10/2011 20:28   Final    Colony Count NO GROWTH   Final    Culture NO GROWTH   Final    Report Status 10/11/2011 FINAL   Final   CLOSTRIDIUM DIFFICILE BY PCR     Status: Normal   Collection Time   10/13/11  2:00 PM      Component Value Range Status Comment   C difficile by pcr NEGATIVE  NEGATIVE Final     Coagulation Studies: No results found for this basename: LABPROT:5,INR:5 in the last 72 hours  Urinalysis: No results found for this  basename: COLORURINE:2,APPERANCEUR:2,LABSPEC:2,PHURINE:2,GLUCOSEU:2,HGBUR:2,BILIRUBINUR:2,KETONESUR:2,PROTEINUR:2,UROBILINOGEN:2,NITRITE:2,LEUKOCYTESUR:2 in the last 72 hours    Imaging: No results found.   Medications:        . amLODipine  10 mg Oral Daily  . carvedilol  25 mg Oral BID WC  . clonazePAM  0.5 mg Oral BID  . darbepoetin  60 mcg Subcutaneous Q Wed-1800  . divalproex  750 mg Oral Q8H  . feeding supplement  1 Container Oral TID BM  . ferrous sulfate  325 mg Oral TID  . furosemide  160 mg Oral BID  . heparin  5,000 Units Subcutaneous Q8H  . loperamide  2 mg Oral BID  . 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 Creatinine at baseline  Most issues center around GI complaints that appear to be diarrhea stools and diminished appetite. Her diarrhea is improving. Cr CL is 18. Creatinine lower . Agree with maximizing nutritional support. She appears to have significant protein calorie malnutrition.   Remove catheter today   LOS: 7 Khori Underberg W @TODAY @9 :25 AM

## 2011-10-19 MED FILL — Chlorhexidine Gluconate Liquid 4%: CUTANEOUS | Qty: 30 | Status: AC

## 2011-10-27 ENCOUNTER — Other Ambulatory Visit (HOSPITAL_COMMUNITY): Payer: Self-pay | Admitting: *Deleted

## 2011-10-28 ENCOUNTER — Encounter (HOSPITAL_COMMUNITY)
Admission: RE | Admit: 2011-10-28 | Discharge: 2011-10-28 | Disposition: A | Discharge status: Home / Self Care | Payer: Medicaid Other | Source: Ambulatory Visit | Attending: Nephrology | Admitting: Nephrology

## 2011-10-28 DIAGNOSIS — N189 Chronic kidney disease, unspecified: Secondary | ICD-10-CM | POA: Insufficient documentation

## 2011-10-28 DIAGNOSIS — D631 Anemia in chronic kidney disease: Secondary | ICD-10-CM | POA: Insufficient documentation

## 2011-10-28 DIAGNOSIS — I129 Hypertensive chronic kidney disease with stage 1 through stage 4 chronic kidney disease, or unspecified chronic kidney disease: Secondary | ICD-10-CM | POA: Insufficient documentation

## 2011-10-28 LAB — RENAL FUNCTION PANEL
CO2: 30 mEq/L (ref 19–32)
Calcium: 8.8 mg/dL (ref 8.4–10.5)
GFR calc Af Amer: 37 mL/min — ABNORMAL LOW (ref >90)
GFR calc non Af Amer: 32 mL/min — ABNORMAL LOW (ref >90)
Phosphorus: 3.4 mg/dL (ref 2.3–4.6)
Sodium: 142 mEq/L (ref 135–145)

## 2011-10-28 MED ORDER — DARBEPOETIN ALFA-POLYSORBATE 100 MCG/0.5ML IJ SOLN
100.0000 ug | INTRAMUSCULAR | Status: DC
  Administered 2011-10-28: 100 ug via SUBCUTANEOUS
  Filled 2011-10-28: qty 0.5

## 2011-10-28 NOTE — Progress Notes (Signed)
Dr. Caryn Section paged per request of office;

## 2011-10-28 NOTE — Progress Notes (Signed)
Hemocue 7.3; [facility] notified, spoke with "Chyrl Civatte", will talk with MD and return call

## 2011-10-29 LAB — PTH, INTACT AND CALCIUM: Calcium, Total (PTH): 8.4 mg/dL (ref 8.4–10.5)

## 2011-11-10 ENCOUNTER — Other Ambulatory Visit (HOSPITAL_COMMUNITY): Payer: Self-pay | Admitting: *Deleted

## 2011-11-11 ENCOUNTER — Encounter (HOSPITAL_COMMUNITY)
Admission: RE | Admit: 2011-11-11 | Discharge: 2011-11-11 | Disposition: A | Discharge status: Home / Self Care | Payer: Medicaid Other | Source: Ambulatory Visit | Attending: Nephrology | Admitting: Nephrology

## 2011-11-11 MED ORDER — FERUMOXYTOL INJECTION 510 MG/17 ML
510.0000 mg | INTRAVENOUS | Status: DC
  Administered 2011-11-11: 510 mg via INTRAVENOUS
  Filled 2011-11-11: qty 17

## 2011-11-11 MED ORDER — SODIUM CHLORIDE 0.9 % IV SOLN
INTRAVENOUS | Status: DC
  Administered 2011-11-11: 15:00:00 via INTRAVENOUS

## 2011-11-11 MED ORDER — DARBEPOETIN ALFA-POLYSORBATE 100 MCG/0.5ML IJ SOLN
100.0000 ug | INTRAMUSCULAR | Status: DC
  Administered 2011-11-11: 100 ug via SUBCUTANEOUS
  Filled 2011-11-11: qty 0.5

## 2011-11-16 ENCOUNTER — Encounter (HOSPITAL_COMMUNITY)
Admission: RE | Admit: 2011-11-16 | Discharge: 2011-11-16 | Disposition: A | Discharge status: Home / Self Care | Payer: Medicaid Other | Source: Ambulatory Visit | Attending: Nephrology | Admitting: Nephrology

## 2011-11-16 LAB — FERRITIN: Ferritin: 525 ng/mL — ABNORMAL HIGH (ref 10–291)

## 2011-11-16 LAB — IRON AND TIBC
Iron: 136 ug/dL — ABNORMAL HIGH (ref 42–135)
UIBC: 127 ug/dL (ref 125–400)

## 2011-11-16 LAB — POCT HEMOGLOBIN-HEMACUE: Hemoglobin: 8.3 g/dL — ABNORMAL LOW (ref 12.0–15.0)

## 2011-11-16 MED ORDER — DARBEPOETIN ALFA-POLYSORBATE 100 MCG/0.5ML IJ SOLN
INTRAMUSCULAR | Status: AC
  Filled 2011-11-16: qty 0.5

## 2011-11-16 MED ORDER — DARBEPOETIN ALFA-POLYSORBATE 100 MCG/0.5ML IJ SOLN
100.0000 ug | INTRAMUSCULAR | Status: DC
  Administered 2011-11-16: 100 ug via SUBCUTANEOUS

## 2011-11-16 MED ORDER — SODIUM CHLORIDE 0.9 % IV SOLN: INTRAVENOUS | Status: DC

## 2011-11-16 MED ORDER — FERUMOXYTOL INJECTION 510 MG/17 ML
510.0000 mg | INTRAVENOUS | Status: AC
  Administered 2011-11-16: 510 mg via INTRAVENOUS
  Filled 2011-11-16: qty 17

## 2011-11-22 DIAGNOSIS — Z9289 Personal history of other medical treatment: Secondary | ICD-10-CM

## 2011-11-22 HISTORY — DX: Personal history of other medical treatment: Z92.89

## 2011-11-25 ENCOUNTER — Other Ambulatory Visit: Payer: Self-pay | Admitting: *Deleted

## 2011-11-25 ENCOUNTER — Encounter (HOSPITAL_COMMUNITY): Payer: Medicaid Other

## 2011-11-25 MED ORDER — CLONAZEPAM 0.5 MG PO TABS: 0.5000 mg | ORAL_TABLET | Freq: Two times a day (BID) | ORAL | Status: DC

## 2011-11-26 ENCOUNTER — Emergency Department (HOSPITAL_COMMUNITY): Payer: Medicaid Other

## 2011-11-26 ENCOUNTER — Inpatient Hospital Stay (HOSPITAL_COMMUNITY)
Admission: EM | Admit: 2011-11-26 | Discharge: 2011-12-09 | DRG: 091 | Payer: Medicaid Other | Attending: Internal Medicine | Admitting: Internal Medicine

## 2011-11-26 ENCOUNTER — Encounter (HOSPITAL_COMMUNITY): Payer: Self-pay | Admitting: *Deleted

## 2011-11-26 DIAGNOSIS — N170 Acute kidney failure with tubular necrosis: Secondary | ICD-10-CM | POA: Diagnosis present

## 2011-11-26 DIAGNOSIS — R627 Adult failure to thrive: Secondary | ICD-10-CM

## 2011-11-26 DIAGNOSIS — D638 Anemia in other chronic diseases classified elsewhere: Secondary | ICD-10-CM | POA: Diagnosis present

## 2011-11-26 DIAGNOSIS — E8809 Other disorders of plasma-protein metabolism, not elsewhere classified: Secondary | ICD-10-CM

## 2011-11-26 DIAGNOSIS — R578 Other shock: Secondary | ICD-10-CM

## 2011-11-26 DIAGNOSIS — N056 Unspecified nephritic syndrome with dense deposit disease: Secondary | ICD-10-CM

## 2011-11-26 DIAGNOSIS — D696 Thrombocytopenia, unspecified: Secondary | ICD-10-CM | POA: Diagnosis present

## 2011-11-26 DIAGNOSIS — I10 Essential (primary) hypertension: Secondary | ICD-10-CM

## 2011-11-26 DIAGNOSIS — Z8249 Family history of ischemic heart disease and other diseases of the circulatory system: Secondary | ICD-10-CM

## 2011-11-26 DIAGNOSIS — E876 Hypokalemia: Secondary | ICD-10-CM | POA: Diagnosis present

## 2011-11-26 DIAGNOSIS — J189 Pneumonia, unspecified organism: Secondary | ICD-10-CM

## 2011-11-26 DIAGNOSIS — E8770 Fluid overload, unspecified: Secondary | ICD-10-CM | POA: Diagnosis present

## 2011-11-26 DIAGNOSIS — G253 Myoclonus: Secondary | ICD-10-CM

## 2011-11-26 DIAGNOSIS — R4182 Altered mental status, unspecified: Secondary | ICD-10-CM

## 2011-11-26 DIAGNOSIS — I120 Hypertensive chronic kidney disease with stage 5 chronic kidney disease or end stage renal disease: Secondary | ICD-10-CM | POA: Diagnosis present

## 2011-11-26 DIAGNOSIS — T426X1A Poisoning by other antiepileptic and sedative-hypnotic drugs, accidental (unintentional), initial encounter: Secondary | ICD-10-CM

## 2011-11-26 DIAGNOSIS — R0902 Hypoxemia: Secondary | ICD-10-CM

## 2011-11-26 DIAGNOSIS — Z833 Family history of diabetes mellitus: Secondary | ICD-10-CM

## 2011-11-26 DIAGNOSIS — G40409 Other generalized epilepsy and epileptic syndromes, not intractable, without status epilepticus: Secondary | ICD-10-CM

## 2011-11-26 DIAGNOSIS — Y9210 Unspecified residential institution as the place of occurrence of the external cause: Secondary | ICD-10-CM | POA: Diagnosis not present

## 2011-11-26 DIAGNOSIS — G931 Anoxic brain damage, not elsewhere classified: Principal | ICD-10-CM

## 2011-11-26 DIAGNOSIS — J811 Chronic pulmonary edema: Secondary | ICD-10-CM

## 2011-11-26 DIAGNOSIS — A0472 Enterocolitis due to Clostridium difficile, not specified as recurrent: Secondary | ICD-10-CM

## 2011-11-26 DIAGNOSIS — N189 Chronic kidney disease, unspecified: Secondary | ICD-10-CM | POA: Diagnosis present

## 2011-11-26 DIAGNOSIS — D649 Anemia, unspecified: Secondary | ICD-10-CM

## 2011-11-26 DIAGNOSIS — I429 Cardiomyopathy, unspecified: Secondary | ICD-10-CM

## 2011-11-26 DIAGNOSIS — R188 Other ascites: Secondary | ICD-10-CM

## 2011-11-26 DIAGNOSIS — I428 Other cardiomyopathies: Secondary | ICD-10-CM | POA: Diagnosis present

## 2011-11-26 DIAGNOSIS — R569 Unspecified convulsions: Secondary | ICD-10-CM | POA: Diagnosis present

## 2011-11-26 DIAGNOSIS — G9340 Encephalopathy, unspecified: Secondary | ICD-10-CM | POA: Diagnosis present

## 2011-11-26 DIAGNOSIS — D62 Acute posthemorrhagic anemia: Secondary | ICD-10-CM

## 2011-11-26 DIAGNOSIS — T8119XA Other postprocedural shock, initial encounter: Secondary | ICD-10-CM | POA: Diagnosis not present

## 2011-11-26 DIAGNOSIS — I498 Other specified cardiac arrhythmias: Secondary | ICD-10-CM | POA: Diagnosis not present

## 2011-11-26 DIAGNOSIS — G40909 Epilepsy, unspecified, not intractable, without status epilepticus: Secondary | ICD-10-CM | POA: Diagnosis present

## 2011-11-26 DIAGNOSIS — I509 Heart failure, unspecified: Secondary | ICD-10-CM

## 2011-11-26 DIAGNOSIS — J9600 Acute respiratory failure, unspecified whether with hypoxia or hypercapnia: Secondary | ICD-10-CM

## 2011-11-26 DIAGNOSIS — E860 Dehydration: Secondary | ICD-10-CM

## 2011-11-26 DIAGNOSIS — E41 Nutritional marasmus: Secondary | ICD-10-CM | POA: Diagnosis present

## 2011-11-26 DIAGNOSIS — N186 End stage renal disease: Secondary | ICD-10-CM | POA: Diagnosis present

## 2011-11-26 DIAGNOSIS — Z681 Body mass index (BMI) 19 or less, adult: Secondary | ICD-10-CM

## 2011-11-26 DIAGNOSIS — K529 Noninfective gastroenteritis and colitis, unspecified: Secondary | ICD-10-CM

## 2011-11-26 DIAGNOSIS — N179 Acute kidney failure, unspecified: Secondary | ICD-10-CM

## 2011-11-26 DIAGNOSIS — Z8669 Personal history of other diseases of the nervous system and sense organs: Secondary | ICD-10-CM | POA: Diagnosis present

## 2011-11-26 DIAGNOSIS — Y844 Aspiration of fluid as the cause of abnormal reaction of the patient, or of later complication, without mention of misadventure at the time of the procedure: Secondary | ICD-10-CM | POA: Diagnosis not present

## 2011-11-26 LAB — POCT I-STAT 3, ART BLOOD GAS (G3+)
Acid-Base Excess: 10 mmol/L — ABNORMAL HIGH (ref 0.0–2.0)
pCO2 arterial: 55.9 mmHg — ABNORMAL HIGH (ref 35.0–45.0)
pO2, Arterial: 67 mmHg — ABNORMAL LOW (ref 80.0–100.0)

## 2011-11-26 LAB — URINALYSIS, ROUTINE W REFLEX MICROSCOPIC
Nitrite: NEGATIVE
Protein, ur: NEGATIVE mg/dL
Specific Gravity, Urine: 1.017 (ref 1.005–1.030)
Urobilinogen, UA: 0.2 mg/dL (ref 0.0–1.0)

## 2011-11-26 LAB — CBC
MCH: 31.7 pg (ref 26.0–34.0)
MCV: 102.7 fL — ABNORMAL HIGH (ref 78.0–100.0)
Platelets: 84 10*3/uL — ABNORMAL LOW (ref 150–400)
RBC: 2.93 MIL/uL — ABNORMAL LOW (ref 3.87–5.11)
RDW: 14.8 % (ref 11.5–15.5)
WBC: 6.3 10*3/uL (ref 4.0–10.5)

## 2011-11-26 LAB — HEPATIC FUNCTION PANEL
Bilirubin, Direct: 0.1 mg/dL (ref 0.0–0.3)
Indirect Bilirubin: 0.1 mg/dL — ABNORMAL LOW (ref 0.3–0.9)
Total Protein: 6.5 g/dL (ref 6.0–8.3)

## 2011-11-26 LAB — BASIC METABOLIC PANEL
BUN: 60 mg/dL — ABNORMAL HIGH (ref 6–23)
Chloride: 95 mEq/L — ABNORMAL LOW (ref 96–112)
Creatinine, Ser: 3.78 mg/dL — ABNORMAL HIGH (ref 0.50–1.10)
GFR calc Af Amer: 18 mL/min — ABNORMAL LOW (ref >90)
GFR calc non Af Amer: 16 mL/min — ABNORMAL LOW (ref >90)
Potassium: 3.3 mEq/L — ABNORMAL LOW (ref 3.5–5.1)

## 2011-11-26 LAB — POCT PREGNANCY, URINE: Preg Test, Ur: NEGATIVE

## 2011-11-26 LAB — URINE MICROSCOPIC-ADD ON

## 2011-11-26 LAB — AMMONIA: Ammonia: 42 umol/L (ref 11–60)

## 2011-11-26 MED ORDER — DEXTROSE 5 % IV SOLN
1.0000 g | Freq: Once | INTRAVENOUS | Status: AC
  Administered 2011-11-26: 1 g via INTRAVENOUS
  Filled 2011-11-26: qty 10

## 2011-11-26 MED ORDER — VANCOMYCIN HCL IN DEXTROSE 1-5 GM/200ML-% IV SOLN
1000.0000 mg | Freq: Once | INTRAVENOUS | Status: AC
  Administered 2011-11-27: 1000 mg via INTRAVENOUS
  Filled 2011-11-26: qty 200

## 2011-11-26 MED ORDER — FUROSEMIDE 80 MG PO TABS
240.0000 mg | ORAL_TABLET | Freq: Two times a day (BID) | ORAL | Status: DC
  Filled 2011-11-26 (×2): qty 3

## 2011-11-26 MED ORDER — LEVOFLOXACIN IN D5W 750 MG/150ML IV SOLN
750.0000 mg | INTRAVENOUS | Status: DC
  Administered 2011-11-26: 750 mg via INTRAVENOUS
  Filled 2011-11-26: qty 150

## 2011-11-26 MED ORDER — DEXTROSE 5 % IV SOLN
1.0000 g | Freq: Once | INTRAVENOUS | Status: AC
  Administered 2011-11-27: 1 g via INTRAVENOUS
  Filled 2011-11-26: qty 1

## 2011-11-26 MED ORDER — SODIUM CHLORIDE 0.9 % IV SOLN
Freq: Once | INTRAVENOUS | Status: AC
  Administered 2011-11-26: 22:00:00 via INTRAVENOUS

## 2011-11-26 NOTE — ED Notes (Signed)
Family states "increased fluid in abdomen and lower legs over the past week". - Not voided since yesterday-denies pain at present.

## 2011-11-26 NOTE — ED Provider Notes (Addendum)
History     CSN: [id number]  Arrival date & time 11/26/11  1602   First MD Initiated Contact with Patient 11/26/11 1603      Chief Complaint  Patient presents with  . Edema    (Consider location/radiation/quality/duration/timing/severity/associated sxs/prior treatment) HPI Comments: [name] presents via EMS for evaluation with her mother.  She has a known history of TBI, CHF, renal failure and seizure do.  Her mother reports that she has experienced severe abdominal distention over the last week and has been increasingly less active.  She reports that she has had 3 loose bowel movements of=ver the last 3 days.  She denies any fever, nausea, or vomiting.  She became concerned and requested a visit from her home nurse.  The nurse noted the abdominal distention and a low O2 sat prompting a call to EMS.  She reports that she has also had no urine output in the last 24 hours.  The history is provided by a parent. No language interpreter was used.    Past Medical History  Diagnosis Date  . Pneumonia     'walking pneumonia'  . Seizures   . Anemia   . Heart attack   . Cardiomyopathy   . Heart failure   . CHF (congestive heart failure)   . Ileitis   . Chronic kidney disease   . Renal disorder   . MPGN (membranoproliferative glomerulonephritis), type 2   . Hypertension   . Brain injury     Past Surgical History  Procedure Date  . Renal biopsy     Family History  Problem Relation Age of Onset  . Hypertension Mother   . Diabetes Father     History  Substance Use Topics  . Smoking status: Never Smoker   . Smokeless tobacco: Never Used  . Alcohol Use: No    OB History    Grav Para Term Preterm Abortions TAB SAB Ect Mult Living                  Review of Systems  Unable to perform ROS   Allergies  Review of patient's allergies indicates no known allergies.  Home Medications   Current Outpatient Rx  Name Route Sig Dispense Refill  . AMLODIPINE BESYLATE 10  MG PO TABS Oral Take 10 mg by mouth daily.    Marland Kitchen CARVEDILOL 25 MG PO TABS Oral Take 25 mg by mouth 2 (two) times daily with a meal.    . CLONAZEPAM 0.5 MG PO TABS Oral Take 1 tablet (0.5 mg total) by mouth 2 (two) times daily. 60 tablet 0  . DARBEPOETIN ALFA-POLYSORBATE 60 MCG/0.3ML IJ SOLN Subcutaneous Inject 60 mcg into the skin every Wednesday at 6 PM.    . DIVALPROEX SODIUM 250 MG PO TBEC Oral Take 750 mg by mouth 3 (three) times daily.    Marland Kitchen FERROUS SULFATE 325 (65 FE) MG PO TABS Oral Take 325 mg by mouth 3 (three) times daily.    . FUROSEMIDE 80 MG PO TABS Oral Take 2 tablets (160 mg total) by mouth 2 (two) times daily. 120 tablet 3  . LOSARTAN POTASSIUM 100 MG PO TABS Oral Take 1 tablet (100 mg total) by mouth daily. 60 tablet 3  . SODIUM BICARBONATE 650 MG PO TABS Oral Take 1 tablet (650 mg total) by mouth 3 (three) times daily. 90 tablet 0    There were no vitals taken for this visit.  Physical Exam  Nursing note and vitals reviewed. Constitutional:  She appears lethargic.  Non-toxic appearance. She has a sickly appearance. She appears ill. No distress.  HENT:  Head: Normocephalic and atraumatic. No trismus in the jaw.  Right Ear: External ear normal.  Left Ear: External ear normal.  Nose: Nose normal.  Mouth/Throat: Mucous membranes are dry and not cyanotic. No uvula swelling. No oropharyngeal exudate, posterior oropharyngeal edema, posterior oropharyngeal erythema or tonsillar abscesses.  Eyes: Conjunctivae are normal. Pupils are equal, round, and reactive to light. Right eye exhibits no discharge. Left eye exhibits no discharge. No scleral icterus.  Neck: Normal range of motion. Neck supple. No JVD present. No tracheal deviation present. No thyromegaly present.  Cardiovascular: Normal rate, regular rhythm and intact distal pulses.  Exam reveals no gallop and no friction rub.   No murmur heard. Pulmonary/Chest: Effort normal and breath sounds normal. No stridor. No respiratory  distress. She has no wheezes. She has no rales. She exhibits no tenderness.  Abdominal: She exhibits distension. She exhibits no mass. There is generalized tenderness. There is rebound and guarding. There is no rigidity, no CVA tenderness, no tenderness at McBurney's point and negative Murphy's sign. No hernia.  Musculoskeletal: Normal range of motion. She exhibits edema. She exhibits no tenderness.       Note bilat lower extremitiy trophy.  Lymphadenopathy:    She has no cervical adenopathy.  Neurological: She appears lethargic.       No facial asymmetry, pt not following commands.  Skin: Skin is warm, dry and intact. No abrasion and no rash noted. She is not diaphoretic. No cyanosis. No pallor. Nails show no clubbing.    ED Course  Procedures (including critical care time)   Labs Reviewed  LACTIC ACID, PLASMA  CBC  AMMONIA  BASIC METABOLIC PANEL  HEPATIC FUNCTION PANEL   No results found.   No diagnosis found.   Date: 11/26/2011  Rate: 73 bpm  Rhythm: sinus  QRS Axis: right  Intervals: normal  ST/T Wave abnormalities: nonspecific ST changes - flattening  Conduction Disutrbances:none  Narrative Interpretation:   Old EKG Reviewed: St/T wave changes and low precordial voltage not seen on 09/05/11 study      MDM  Pt presents for evaluation of abdominal distention, altered mentals status, and low O2 sat.  She currently appears altered, note elevated BP, nl HR, nl resp effort and rate, nl O2 sat on nrb - pt stable, NAD.  Significant abdominal distention and tenderness on exam.  Plan basic labs + hepatic panel, chest and abd xrays, serum lactic acid and ammonia levels.  2150.  Pt stable, NAD.  Cont depressed mental status.  CXR consistent with bilat infiltrates.  Blood cultures and abx ordered.  Pt covered as a potential candidate for ICU placement.  Abd film and exam consistent with ascites.  Note hypoalbuminemia that is unchanged from recent labs.  Also note supratherapeutic  depakote level.  This may be responsible for her mental status depression.  IVF initiated also secondary to acute renal failure.  Discussed with the on-call hospitalist.  ABG and EKG ordered.  Plan admit to stepdown bed.      Tobin Chad, MD 11/26/11 7829  Tobin Chad, MD 11/26/11 5621  Tobin Chad, MD 11/26/11 3086

## 2011-11-26 NOTE — ED Notes (Signed)
PHLEBOTOMIST AT BEDSIDE COLLECTING BLOOD SPECIMEN FOR BLOOD CULTURES.

## 2011-11-26 NOTE — H&P (Signed)
Heather Sims is an 21 y.o. female. Patient was seen and examined on November 26, 2011 at 10:45 PM. PCP - Dr. Lerry Liner.   Chief Complaint: Mental status changes and hypoxia. HPI: 21 year-old female with history of C3 nephropathy, anoxic brain injury, seizure, cardiomyopathy, hypertension was found to be hypoxic as noticed by patient's home health care nurse. In addition patient's mother also noticed that patient is increasingly somnolent over the last 4-5 days. Patient's ammonia level was found to be high week ago by patient's neurologist and was started on Keppra and the plan was to taper off Depakote. Today in the ER patient's Depakote levels are found to be high more than therapeutic. Patient's chest x-ray shows infiltrates concerning for pneumonia. Patient also has tense ascites with increased lower extremity fluid. Patient has been admitted for further management.  Past Medical History  Diagnosis Date  . Pneumonia     'walking pneumonia'  . Seizures   . Anemia   . Heart attack   . Cardiomyopathy   . Heart failure   . CHF (congestive heart failure)   . Ileitis   . Chronic kidney disease   . Renal disorder   . MPGN (membranoproliferative glomerulonephritis), type 2   . Hypertension   . Brain injury     Past Surgical History  Procedure Date  . Renal biopsy     Family History  Problem Relation Age of Onset  . Hypertension Mother   . Diabetes Father    Social History:  reports that she has never smoked. She has never used smokeless tobacco. She reports that she does not drink alcohol or use illicit drugs.  Allergies: No Known Allergies   (Not in a hospital admission)  Results for orders placed during the hospital encounter of 11/26/11 (from the past 48 hour(s))  LACTIC ACID, PLASMA     Status: Normal   Collection Time   11/26/11  5:16 PM      Component Value Range Comment   Lactic Acid, Venous 0.7  0.5 - 2.2 mmol/L   CBC     Status: Abnormal   Collection Time     11/26/11  5:18 PM      Component Value Range Comment   WBC 6.3  4.0 - 10.5 K/uL    RBC 2.93 (*) 3.87 - 5.11 MIL/uL    Hemoglobin 9.3 (*) 12.0 - 15.0 g/dL    HCT 16.1 (*) 09.6 - 46.0 %    MCV 102.7 (*) 78.0 - 100.0 fL    MCH 31.7  26.0 - 34.0 pg    MCHC 30.9  30.0 - 36.0 g/dL    RDW 04.5  40.9 - 81.1 %    Platelets 84 (*) 150 - 400 K/uL PLATELET COUNT CONFIRMED BY SMEAR  AMMONIA     Status: Normal   Collection Time   11/26/11  5:18 PM      Component Value Range Comment   Ammonia 42  11 - 60 umol/L   BASIC METABOLIC PANEL     Status: Abnormal   Collection Time   11/26/11  5:18 PM      Component Value Range Comment   Sodium 142  135 - 145 mEq/L    Potassium 3.3 (*) 3.5 - 5.1 mEq/L    Chloride 95 (*) 96 - 112 mEq/L    CO2 33 (*) 19 - 32 mEq/L    Glucose, Bld 81  70 - 99 mg/dL    BUN 60 (*) 6 -  23 mg/dL    Creatinine, Ser 3.08 (*) 0.50 - 1.10 mg/dL    Calcium 8.5  8.4 - 65.7 mg/dL    GFR calc non Af Amer 16 (*) >90 mL/min    GFR calc Af Amer 18 (*) >90 mL/min   HEPATIC FUNCTION PANEL     Status: Abnormal   Collection Time   11/26/11  5:18 PM      Component Value Range Comment   Total Protein 6.5  6.0 - 8.3 g/dL    Albumin 1.5 (*) 3.5 - 5.2 g/dL    AST 23  0 - 37 U/L    ALT <5  0 - 35 U/L    Alkaline Phosphatase 39  39 - 117 U/L    Total Bilirubin 0.2 (*) 0.3 - 1.2 mg/dL    Bilirubin, Direct 0.1  0.0 - 0.3 mg/dL    Indirect Bilirubin 0.1 (*) 0.3 - 0.9 mg/dL   VALPROIC ACID LEVEL     Status: Abnormal   Collection Time   11/26/11  5:18 PM      Component Value Range Comment   Valproic Acid Lvl 111.6 (*) 50.0 - 100.0 ug/mL   URINALYSIS, ROUTINE W REFLEX MICROSCOPIC     Status: Abnormal   Collection Time   11/26/11  5:36 PM      Component Value Range Comment   Color, Urine YELLOW  YELLOW    APPearance HAZY (*) CLEAR    Specific Gravity, Urine 1.017  1.005 - 1.030    pH 5.0  5.0 - 8.0    Glucose, UA NEGATIVE  NEGATIVE mg/dL    Hgb urine dipstick LARGE (*) NEGATIVE    Bilirubin  Urine NEGATIVE  NEGATIVE    Ketones, ur NEGATIVE  NEGATIVE mg/dL    Protein, ur NEGATIVE  NEGATIVE mg/dL    Urobilinogen, UA 0.2  0.0 - 1.0 mg/dL    Nitrite NEGATIVE  NEGATIVE    Leukocytes, UA TRACE (*) NEGATIVE   URINE MICROSCOPIC-ADD ON     Status: Abnormal   Collection Time   11/26/11  5:36 PM      Component Value Range Comment   Squamous Epithelial / LPF RARE  RARE    WBC, UA 0-2  <3 WBC/hpf    RBC / HPF 11-20  <3 RBC/hpf    Bacteria, UA RARE  RARE    Casts HYALINE CASTS (*) NEGATIVE RED CELL CAST   Urine-Other AMORPHOUS URATES/PHOSPHATES     POCT PREGNANCY, URINE     Status: Normal   Collection Time   11/26/11  5:43 PM      Component Value Range Comment   Preg Test, Ur NEGATIVE  NEGATIVE   POCT I-STAT 3, BLOOD GAS (G3+)     Status: Abnormal   Collection Time   11/26/11 10:57 PM      Component Value Range Comment   pH, Arterial 7.417  7.350 - 7.450    pCO2 arterial 55.9 (*) 35.0 - 45.0 mmHg    pO2, Arterial 67.0 (*) 80.0 - 100.0 mmHg    Bicarbonate 36.0 (*) 20.0 - 24.0 mEq/L    TCO2 38  0 - 100 mmol/L    O2 Saturation 93.0      Acid-Base Excess 10.0 (*) 0.0 - 2.0 mmol/L    Collection site RADIAL, ALLEN'S TEST ACCEPTABLE      Drawn by RT      Sample type ARTERIAL      Dg Chest 2 View  11/26/2011  *RADIOLOGY  REPORT*  Clinical Data: Altered mental status.  Low oxygen saturations.  CHEST - 2 VIEW  Comparison: Chest x-ray 09/11/2011 (part of the acute abdominal series).  Findings: Lung volumes are very low.  There is extensive airspace consolidation and air bronchograms throughout the entire left mid and lower lung, as well as the right lower lung.  This completely obscures the cardiac silhouette.  Pulmonary vascular crowding without frank pulmonary edema.  Probable left pleural effusion (likely moderate).  No definite right pleural effusion. Mediastinal contours are distorted and obscured.  IMPRESSION: 1.  Extensive airspace consolidation throughout the lung bases and the left mid  lung, concerning for acute infection.  Given the predominant basilar distribution, this may represent sequelae of large volume aspiration. 2.  Left pleural effusion likely moderate. 3.  Low lung volumes.   Original Report Authenticated By: Florencia Reasons, M.D.    Dg Abd 2 Views  11/26/2011  *RADIOLOGY REPORT*  Clinical Data: Abdominal distension.  Shortness of breath.  History of congestive heart failure.  ABDOMEN - 2 VIEW  Comparison: 10/11/2011 radiographs; CT 08/24/2011.  Findings: The abdomen is nearly gasless.  There is diffuse soft tissue density throughout the abdomen consistent with worsening ascites.  There is no evidence of bowel obstruction or pneumoperitoneum.  There are no suspicious calcifications.  Osseous structures appear normal.  IMPRESSION: Worsening ascites.   Original Report Authenticated By: Gerrianne Scale, M.D.     Review of Systems  Constitutional: Negative.   HENT: Negative.   Eyes: Negative.   Respiratory:       Hypoxia.  Gastrointestinal: Negative.   Genitourinary: Negative.   Musculoskeletal: Negative.   Skin: Negative.   Neurological:       Somnolence.  Psychiatric/Behavioral: Negative.     Blood pressure 124/95, pulse 75, temperature 98.2 F (36.8 C), temperature source Rectal, resp. rate 16, last menstrual period 10/08/2011, SpO2 98.00%. Physical Exam  Constitutional: She appears well-developed.       Moderately nourished.  HENT:  Head: Normocephalic and atraumatic.  Right Ear: External ear normal.  Left Ear: External ear normal.  Nose: Nose normal.  Mouth/Throat: Oropharynx is clear and moist. No oropharyngeal exudate.  Eyes: Conjunctivae are normal. Pupils are equal, round, and reactive to light. Right eye exhibits no discharge. Left eye exhibits no discharge. No scleral icterus.  Neck: Normal range of motion. Neck supple.  Cardiovascular: Normal rate.   Respiratory: Effort normal and breath sounds normal. No respiratory distress. She has no  wheezes. She has no rales.  GI: Soft. She exhibits distension. There is no tenderness. There is no rebound.  Musculoskeletal: She exhibits edema. She exhibits no tenderness.  Neurological:       Drowsy but arousable. Follows commands.  Skin: Skin is warm and dry.     Assessment/Plan #1. Acute encephalopathy - most likely secondary to valproic acid toxic levels. Discontinue valproic acid. Recheck levels in a.m. Monitor shows normal sinus rhythm closely monitor cardiac rhythm. Continue Keppra. Recheck LFTs in a.m. along with ammonia. CT head is pending. #2. Acute renal failure on chronic kidney disease with history of C3 nephropathy - I have discussed with Dr. Lacy Duverney nephrologist on-call. At this time we are going to change her by mouth Lasix to IV Lasix 160 mg every 6 hourly. Hold off Cozaar. Check UA. Closely follow metabolic panel and intake output and further recommendations per nephrology consult. #3. Hypoxia possibly secondary to fluid overload/ascites/possible pneumonia  - patient has been started on antibiotics empirically. Continue IV  Lasix as mentioned in #2. #4. Ascites - patient's Lasix dose has been changed to IV dose. Closely follow intake output may need therapeutic paracentesis if still continues to be tense ascites which may also be coughing hypoxia. #5. Thrombocytopenia - may be from Depakote toxicity. But since patient's renal function is worsening we will check peripheral smear study. #6. History of anoxic brain injury. #7. History of hypertension - continue home medications except for ARB due to worsening renal failure.  CODE STATUS - full code.   Desera Graffeo N. 11/26/2011, 11:03 PM

## 2011-11-26 NOTE — Progress Notes (Addendum)
ANTIBIOTIC CONSULT NOTE - INITIAL  Pharmacy Consult for vancomycin, cefepime, levofloxacin Indication: rule out pneumonia  No Known Allergies  Patient Measurements: Height: 5\' 2"  (157.5 cm) Weight: 120 lb (54.432 kg) (Per 11/11/11 documentation) IBW/kg (Calculated) : 50.1   Vital Signs: Temp: 98.2 F (36.8 C) (09/05 1731) Temp src: Rectal (09/05 1731) BP: 124/95 mmHg (09/05 2123) Pulse Rate: 75  (09/05 2123) Intake/Output from previous day:   Intake/Output from this shift:    Labs:  Basename 11/26/11 1718  WBC 6.3  HGB 9.3*  PLT 84*  LABCREA --  CREATININE 3.78*   Estimated Creatinine Clearance: 18.5 ml/min (by C-G formula based on Cr of 3.78). No results found for this basename: VANCOTROUGH:2,VANCOPEAK:2,VANCORANDOM:2,GENTTROUGH:2,GENTPEAK:2,GENTRANDOM:2,TOBRATROUGH:2,TOBRAPEAK:2,TOBRARND:2,AMIKACINPEAK:2,AMIKACINTROU:2,AMIKACIN:2, in the last 72 hours   Microbiology: No results found for this or any previous visit (from the past 720 hour(s)).  Medical History: Past Medical History  Diagnosis Date  . Pneumonia     'walking pneumonia'  . Seizures   . Anemia   . Heart attack   . Cardiomyopathy   . Heart failure   . CHF (congestive heart failure)   . Ileitis   . Chronic kidney disease   . Renal disorder   . MPGN (membranoproliferative glomerulonephritis), type 2   . Hypertension   . Brain injury     Medications:  Scheduled:    . sodium chloride   Intravenous Once  . cefTRIAXone (ROCEPHIN)  IV  1 g Intravenous Once  . DISCONTD: furosemide  240 mg Oral BID  . DISCONTD: levofloxacin (LEVAQUIN) IV  750 mg Intravenous Q24H   Assessment: 21 yo female with h/o TBI and CKD presents with abdominal distension, AMS, and possible pneumonia. Pharmacy consulted to manage vancomycin, levofloxacin, and cefepime. Patient has already received levofloxacin 750mg  IV x 1.   Goal of Therapy:  Vancomycin trough level 15-20 mcg/ml  Plan:  1. Cefepime 1gm IV Q24H.  2.  Levofloxacin 500mg  IV Q48H. 3. Vancomycin 1gm x 1, then 750mg  IV Q48H.  Emeline Gins 11/26/2011,11:36 PM

## 2011-11-26 NOTE — ED Notes (Signed)
02 decreased to 4l via N/C with Sat's maintaining 97%

## 2011-11-26 NOTE — ED Notes (Signed)
Old and new EKG handed to Dr. Powers.  Extra copies of both placed in pt chart 

## 2011-11-27 ENCOUNTER — Inpatient Hospital Stay (HOSPITAL_COMMUNITY): Payer: Medicaid Other

## 2011-11-27 ENCOUNTER — Encounter (HOSPITAL_COMMUNITY): Payer: Self-pay | Admitting: Radiology

## 2011-11-27 ENCOUNTER — Other Ambulatory Visit (HOSPITAL_COMMUNITY): Payer: Self-pay | Admitting: *Deleted

## 2011-11-27 DIAGNOSIS — G931 Anoxic brain damage, not elsewhere classified: Principal | ICD-10-CM

## 2011-11-27 DIAGNOSIS — R188 Other ascites: Secondary | ICD-10-CM

## 2011-11-27 DIAGNOSIS — R4182 Altered mental status, unspecified: Secondary | ICD-10-CM

## 2011-11-27 LAB — CBC WITH DIFFERENTIAL/PLATELET
Eosinophils Relative: 3 % (ref 0–5)
Lymphocytes Relative: 25 % (ref 12–46)
Lymphs Abs: 1.6 10*3/uL (ref 0.7–4.0)
MCV: 103.3 fL — ABNORMAL HIGH (ref 78.0–100.0)
Neutrophils Relative %: 61 % (ref 43–77)
Platelets: 81 10*3/uL — ABNORMAL LOW (ref 150–400)
RBC: 2.75 MIL/uL — ABNORMAL LOW (ref 3.87–5.11)
WBC: 6.2 10*3/uL (ref 4.0–10.5)

## 2011-11-27 LAB — COMPREHENSIVE METABOLIC PANEL
Albumin: 1.4 g/dL — ABNORMAL LOW (ref 3.5–5.2)
BUN: 59 mg/dL — ABNORMAL HIGH (ref 6–23)
Chloride: 96 mEq/L (ref 96–112)
Creatinine, Ser: 3.63 mg/dL — ABNORMAL HIGH (ref 0.50–1.10)
Total Bilirubin: 0.1 mg/dL — ABNORMAL LOW (ref 0.3–1.2)

## 2011-11-27 LAB — VALPROIC ACID LEVEL: Valproic Acid Lvl: 89.7 ug/mL (ref 50.0–100.0)

## 2011-11-27 LAB — GLUCOSE, CAPILLARY
Glucose-Capillary: 80 mg/dL (ref 70–99)
Glucose-Capillary: 92 mg/dL (ref 70–99)

## 2011-11-27 LAB — MRSA PCR SCREENING: MRSA by PCR: NEGATIVE

## 2011-11-27 MED ORDER — AMLODIPINE BESYLATE 5 MG PO TABS
5.0000 mg | ORAL_TABLET | Freq: Every day | ORAL | Status: DC
  Administered 2011-11-28: 5 mg via ORAL
  Filled 2011-11-27: qty 1

## 2011-11-27 MED ORDER — POTASSIUM CHLORIDE 10 MEQ/100ML IV SOLN
10.0000 meq | INTRAVENOUS | Status: AC
  Administered 2011-11-27 (×4): 10 meq via INTRAVENOUS
  Filled 2011-11-27 (×5): qty 100

## 2011-11-27 MED ORDER — ONDANSETRON HCL 4 MG PO TABS: 4.0000 mg | ORAL_TABLET | Freq: Four times a day (QID) | ORAL | Status: DC | PRN

## 2011-11-27 MED ORDER — SODIUM BICARBONATE 650 MG PO TABS
650.0000 mg | ORAL_TABLET | Freq: Three times a day (TID) | ORAL | Status: DC
  Administered 2011-11-27 – 2011-11-28 (×5): 650 mg via ORAL
  Filled 2011-11-27 (×6): qty 1

## 2011-11-27 MED ORDER — ACETAMINOPHEN 325 MG PO TABS: 650.0000 mg | ORAL_TABLET | Freq: Four times a day (QID) | ORAL | Status: DC | PRN

## 2011-11-27 MED ORDER — CARVEDILOL 25 MG PO TABS
25.0000 mg | ORAL_TABLET | Freq: Two times a day (BID) | ORAL | Status: DC
  Administered 2011-11-27 – 2011-11-28 (×3): 25 mg via ORAL
  Filled 2011-11-27 (×6): qty 1

## 2011-11-27 MED ORDER — DEXTROSE 5 % IV SOLN
1.0000 g | INTRAVENOUS | Status: DC
  Filled 2011-11-27: qty 1

## 2011-11-27 MED ORDER — SODIUM CHLORIDE 0.9 % IV SOLN: 750.0000 mg | INTRAVENOUS | Status: DC

## 2011-11-27 MED ORDER — SODIUM CHLORIDE 0.9 % IJ SOLN
3.0000 mL | Freq: Two times a day (BID) | INTRAMUSCULAR | Status: DC
  Administered 2011-11-27 (×2): 3 mL via INTRAVENOUS

## 2011-11-27 MED ORDER — DARBEPOETIN ALFA-POLYSORBATE 60 MCG/0.3ML IJ SOLN
60.0000 ug | INTRAMUSCULAR | Status: DC
  Filled 2011-11-27: qty 0.3

## 2011-11-27 MED ORDER — ONDANSETRON HCL 4 MG/2ML IJ SOLN: 4.0000 mg | Freq: Four times a day (QID) | INTRAMUSCULAR | Status: DC | PRN

## 2011-11-27 MED ORDER — FUROSEMIDE 10 MG/ML IJ SOLN
160.0000 mg | Freq: Four times a day (QID) | INTRAMUSCULAR | Status: DC
  Administered 2011-11-27 – 2011-11-28 (×6): 160 mg via INTRAVENOUS
  Filled 2011-11-27 (×8): qty 16

## 2011-11-27 MED ORDER — SODIUM CHLORIDE 0.9 % IJ SOLN
3.0000 mL | Freq: Two times a day (BID) | INTRAMUSCULAR | Status: DC
  Administered 2011-11-27: 3 mL via INTRAVENOUS

## 2011-11-27 MED ORDER — LEVOFLOXACIN IN D5W 500 MG/100ML IV SOLN: 500.0000 mg | INTRAVENOUS | Status: DC

## 2011-11-27 MED ORDER — LEVETIRACETAM 250 MG PO TABS
250.0000 mg | ORAL_TABLET | Freq: Two times a day (BID) | ORAL | Status: DC
  Administered 2011-11-27 – 2011-11-29 (×6): 250 mg via ORAL
  Filled 2011-11-27 (×7): qty 1

## 2011-11-27 MED ORDER — FERROUS SULFATE 325 (65 FE) MG PO TABS
325.0000 mg | ORAL_TABLET | Freq: Three times a day (TID) | ORAL | Status: DC
  Administered 2011-11-27 – 2011-11-29 (×6): 325 mg via ORAL
  Filled 2011-11-27 (×11): qty 1

## 2011-11-27 MED ORDER — ACETAMINOPHEN 650 MG RE SUPP: 650.0000 mg | Freq: Four times a day (QID) | RECTAL | Status: DC | PRN

## 2011-11-27 MED ORDER — AMLODIPINE BESYLATE 10 MG PO TABS
10.0000 mg | ORAL_TABLET | Freq: Every day | ORAL | Status: DC
  Administered 2011-11-27: 10 mg via ORAL
  Filled 2011-11-27: qty 1

## 2011-11-27 NOTE — Progress Notes (Signed)
[facility] Progress Note [place] TEAM 1 - Stepdown/ICU TEAM   Heather Sims ZOX:[id number] DOB: [birth year] DOA: 11/26/2011 PCP: Jaclyn Shaggy, MD  Brief narrative: 21 year-old female with history of nephropathy, anoxic brain injury, seizure, cardiomyopathy, hypertension was found to be hypoxic as noticed by patient's home health care nurse. In addition patient's mother also noticed that patient is increasingly somnolent over the last 4-5 days. Patient's ammonia level was found to be high week ago by patient's neurologist and was started on Keppra and the plan was to taper off Depakote. In the ER patient's Depakote levels were found to be high more than therapeutic. Patient's chest x-ray shows infiltrates concerning for pneumonia. Patient also has tense ascites with increased lower extremity fluid. Patient has been admitted for further management  Assessment/Plan:  Acute encephalopathy CT scan of head without acute findings - ammonia level is within the normal range - urinalysis is without suggestion of urinary tract infection - Depakote level was markedly elevated at presentation - perhaps this is primarily Depakote induced lethargy plus or minus uremia - check B12 and folic acid levels  Acute on chronic renal failure - MPGN (membranoproliferative glomerulonephritis), type 2  Baseline Cr 2.3 - nephrology is following and is considering hemodialysis for treatment of refractory volume overload  Hypoxia The patient is afebrile - her hypoxia is improving - her white blood cell count is not significantly elevated - at this time I find no overwhelming evidence for an infectious pulmonary process - I will discontinue antibiotics and follow clinically - I suspect her hypoxia is due to simple pulmonary edema, or possibly aspiration pneumonitis - followup chest x-ray in a.m.  Ascites Possibly simply due to volume overload in the setting of low oncotic pressure (albumin is 1.5) - we'll follow  with initiation of hemodialysis - of note LFTs are unremarkable - will obtain viral hepatitis panel as a precaution - a CT scan of the abdomen and pelvis in June 2013 revealed ascites at that time with normal liver architecture  Seizure disorder with Depakote toxicity Depakote has been discontinued and the patient is receiving Keppra  Cardiomyopathy Ejection fraction 40-45% via echocardiogram January 2013 - previously felt to be combination of hypertensive heart disease as well as sequelae of PEA arrest with 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  Mild hypokalemia Follow without treatment in setting of acute renal failure  Macrocytic anemia Check B12 and folic acid levels - is likely multifactorial with contributions from chronic renal disease, Depakote toxicity, and possible B12 or folic acid deficiencies  Thrombocytopenia Likely related to Depakote toxicity - no evidence of acute blood loss - follow trend  History of anoxic brain injury s/p PEA arrest January 2013 secondary to cardio-respiratory failure from pulmonary edema secondary to acute renal failure   History of hypertension Blood pressure is currently well-controlled  Code Status: Full Disposition Plan: Remain in step down unit  Consultants: Nephrology  Procedures: None  Antibiotics: Levaquin 11/26/2011>>11/27/2011 Maxipime 11/26/2011>>11/27/2011 Vancomycin 11/26/2011>>11/27/2011  DVT prophylaxis: SCDs only at this time as patient is to have hemodialysis catheter placed  HPI/Subjective: The patient opens her eyes to stimulation but does not attempt verbalization.  She does not follow simple commands.  Her mother is at the bedside and states that she is normally more interactive than this.   Objective: Blood pressure 117/88, pulse 71, temperature 98.7 F (37.1 C), temperature source Oral, resp. rate 15, height 5\' 2"  (1.575 m), weight 66.4 kg (146 lb 6.2 oz), last menstrual  period 10/08/2011,  SpO2 94.00%.  Intake/Output Summary (Last 24 hours) at 11/27/11 1453 Last data filed at 11/27/11 1200  Gross per 24 hour  Intake    132 ml  Output    400 ml  Net   -268 ml    Exam: General: No acute respiratory distress at present Lungs: Fine crackles appreciated diffusely with no wheeze Cardiovascular: Regular rate and rhythm without murmur gallop or rub normal S1 and S2 Abdomen: Very protuberant consistent with severe ascites, no rebound no appreciable focal mass bowel sounds are hypoactive but Extremities: 1+ bilateral lower extremity edema without cyanosis or clubbing  Data Reviewed: Basic Metabolic Panel:  Lab 11/27/11 2130 11/26/11 1718  NA 143 142  K 3.1* 3.3*  CL 96 95*  CO2 33* 33*  GLUCOSE 76 81  BUN 59* 60*  CREATININE 3.63* 3.78*  CALCIUM 8.5 8.5  MG -- --  PHOS -- --   Liver Function Tests:  Lab 11/27/11 0330 11/26/11 1718  AST 24 23  ALT <5 <5  ALKPHOS 38* 39  BILITOT 0.1* 0.2*  PROT 6.3 6.5  ALBUMIN 1.4* 1.5*    Lab 11/26/11 1718  AMMONIA 42   CBC:  Lab 11/27/11 0249 11/26/11 1718  WBC 6.2 6.3  NEUTROABS 3.8 --  HGB 8.9* 9.3*  HCT 28.4* 30.1*  MCV 103.3* 102.7*  PLT 81* 84*   BNP (last 3 results)  Basename 03/26/11 2140  PROBNP 11557.0*   CBG:  Lab 11/27/11 1047 11/27/11 0734 11/27/11 0404 11/27/11 0208  GLUCAP 99 85 92 80    Recent Results (from the past 240 hour(s))  MRSA PCR SCREENING     Status: Normal   Collection Time   11/27/11  1:56 AM      Component Value Range Status Comment   MRSA by PCR NEGATIVE  NEGATIVE Final      Studies:  Recent x-ray studies have been reviewed in detail by the Attending Physician  Scheduled Meds:  Reviewed in detail by the Attending Physician   Lonia Blood, MD [facility] Office  [phone] Pager [phone]  On-Call/Text Page:      [email or web address]      password [email or web address]  If 7PM-7AM, please contact night-coverage [email or web address] Password [email or web address] 11/27/2011, 2:53 PM   LOS: 1  day

## 2011-11-27 NOTE — Progress Notes (Signed)
Patient ID: Heather Sims, female   DOB: Mar 17, 1991, 21 y.o.   MRN: [id number] Request received for placement of HD catheter in pt with hx of anoxic brain injury s/p PEA arrest , acute on chronic renal failure from MPGN, ascites and hypoxia. Exam- chest - decreased BS bases, heart- RRR, abd dist/tense secondary to ascites, hypoactive BS, lower ext with 1+ edema.    Filed Vitals:   11/27/11 0404 11/27/11 0800 11/27/11 1200 11/27/11 1455  BP:  117/88 111/83   Pulse:  71 70   Temp: 97.5 F (36.4 C) 97.8 F (36.6 C) 98.7 F (37.1 C) 97.4 F (36.3 C)  TempSrc: Axillary Oral Oral Oral  Resp:  15 11   Height:      Weight:      SpO2:  94% 96%    Past Medical History  Diagnosis Date  . Pneumonia     'walking pneumonia'  . Seizures   . Anemia   . Heart attack   . Cardiomyopathy   . Heart failure   . CHF (congestive heart failure)   . Ileitis   . Chronic kidney disease   . Renal disorder   . MPGN (membranoproliferative glomerulonephritis), type 2   . Hypertension   . Brain injury    Past Surgical History  Procedure Date  . Renal biopsy    Dg Chest 2 View  11/26/2011  *RADIOLOGY REPORT*  Clinical Data: Altered mental status.  Low oxygen saturations.  CHEST - 2 VIEW  Comparison: Chest x-ray 09/11/2011 (part of the acute abdominal series).  Findings: Lung volumes are very low.  There is extensive airspace consolidation and air bronchograms throughout the entire left mid and lower lung, as well as the right lower lung.  This completely obscures the cardiac silhouette.  Pulmonary vascular crowding without frank pulmonary edema.  Probable left pleural effusion (likely moderate).  No definite right pleural effusion. Mediastinal contours are distorted and obscured.  IMPRESSION: 1.  Extensive airspace consolidation throughout the lung bases and the left mid lung, concerning for acute infection.  Given the predominant basilar distribution, this may represent sequelae of large volume aspiration. 2.   Left pleural effusion likely moderate. 3.  Low lung volumes.   Original Report Authenticated By: Florencia Reasons, M.D.    Ct Head Wo Contrast  11/27/2011  *RADIOLOGY REPORT*  Clinical Data: Drowsiness.  CT HEAD WITHOUT CONTRAST  Technique:  Contiguous axial images were obtained from the base of the skull through the vertex without contrast.  Comparison: None.  Findings: The ventricles and subarachnoid spaces are prominent for age consistent with atrophy.  There is no evidence of acute intracranial hemorrhage, mass lesion, brain edema or extra-axial fluid collection.  There is no evidence of acute infarction.  The visualized paranasal sinuses are clear.  The calvarium is intact.  IMPRESSION:  1.  Moderate atrophy for age. 2.  No acute intracranial findings.   Original Report Authenticated By: Gerrianne Scale, M.D.    Dg Chest Port 1 View  11/27/2011  *RADIOLOGY REPORT*  Clinical Data: Follow up infiltrate  PORTABLE CHEST - 1 VIEW  Comparison: 11/26/2011  Findings: Low lung volumes.  Patchy left mid/lower lobe opacities, unchanged.  The heart is top norma in size for inspiration.  No pneumothorax is seen.  IMPRESSION: Patchy left mid/lower lobe opacities, unchanged.   Original Report Authenticated By: Charline Bills, M.D.    Dg Abd 2 Views  11/26/2011  *RADIOLOGY REPORT*  Clinical Data: Abdominal distension.  Shortness  of breath.  History of congestive heart failure.  ABDOMEN - 2 VIEW  Comparison: 10/11/2011 radiographs; CT 08/24/2011.  Findings: The abdomen is nearly gasless.  There is diffuse soft tissue density throughout the abdomen consistent with worsening ascites.  There is no evidence of bowel obstruction or pneumoperitoneum.  There are no suspicious calcifications.  Osseous structures appear normal.  IMPRESSION: Worsening ascites.   Original Report Authenticated By: Gerrianne Scale, M.D.   Results for orders placed during the hospital encounter of 11/26/11  LACTIC ACID, PLASMA      Component  Value Range   Lactic Acid, Venous 0.7  0.5 - 2.2 mmol/L  CBC      Component Value Range   WBC 6.3  4.0 - 10.5 K/uL   RBC 2.93 (*) 3.87 - 5.11 MIL/uL   Hemoglobin 9.3 (*) 12.0 - 15.0 g/dL   HCT 16.1 (*) 09.6 - 04.5 %   MCV 102.7 (*) 78.0 - 100.0 fL   MCH 31.7  26.0 - 34.0 pg   MCHC 30.9  30.0 - 36.0 g/dL   RDW 40.9  81.1 - 91.4 %   Platelets 84 (*) 150 - 400 K/uL  AMMONIA      Component Value Range   Ammonia 42  11 - 60 umol/L  BASIC METABOLIC PANEL      Component Value Range   Sodium 142  135 - 145 mEq/L   Potassium 3.3 (*) 3.5 - 5.1 mEq/L   Chloride 95 (*) 96 - 112 mEq/L   CO2 33 (*) 19 - 32 mEq/L   Glucose, Bld 81  70 - 99 mg/dL   BUN 60 (*) 6 - 23 mg/dL   Creatinine, Ser 7.82 (*) 0.50 - 1.10 mg/dL   Calcium 8.5  8.4 - 95.6 mg/dL   GFR calc non Af Amer 16 (*) >90 mL/min   GFR calc Af Amer 18 (*) >90 mL/min  HEPATIC FUNCTION PANEL      Component Value Range   Total Protein 6.5  6.0 - 8.3 g/dL   Albumin 1.5 (*) 3.5 - 5.2 g/dL   AST 23  0 - 37 U/L   ALT <5  0 - 35 U/L   Alkaline Phosphatase 39  39 - 117 U/L   Total Bilirubin 0.2 (*) 0.3 - 1.2 mg/dL   Bilirubin, Direct 0.1  0.0 - 0.3 mg/dL   Indirect Bilirubin 0.1 (*) 0.3 - 0.9 mg/dL  VALPROIC ACID LEVEL      Component Value Range   Valproic Acid Lvl 111.6 (*) 50.0 - 100.0 ug/mL  URINALYSIS, ROUTINE W REFLEX MICROSCOPIC      Component Value Range   Color, Urine YELLOW  YELLOW   APPearance HAZY (*) CLEAR   Specific Gravity, Urine 1.017  1.005 - 1.030   pH 5.0  5.0 - 8.0   Glucose, UA NEGATIVE  NEGATIVE mg/dL   Hgb urine dipstick LARGE (*) NEGATIVE   Bilirubin Urine NEGATIVE  NEGATIVE   Ketones, ur NEGATIVE  NEGATIVE mg/dL   Protein, ur NEGATIVE  NEGATIVE mg/dL   Urobilinogen, UA 0.2  0.0 - 1.0 mg/dL   Nitrite NEGATIVE  NEGATIVE   Leukocytes, UA TRACE (*) NEGATIVE  POCT PREGNANCY, URINE      Component Value Range   Preg Test, Ur NEGATIVE  NEGATIVE  URINE MICROSCOPIC-ADD ON      Component Value Range   Squamous  Epithelial / LPF RARE  RARE   WBC, UA 0-2  <3 WBC/hpf   RBC /  HPF 11-20  <3 RBC/hpf   Bacteria, UA RARE  RARE   Casts HYALINE CASTS (*) NEGATIVE   Urine-Other AMORPHOUS URATES/PHOSPHATES    POCT I-STAT 3, BLOOD GAS (G3+)      Component Value Range   pH, Arterial 7.417  7.350 - 7.450   pCO2 arterial 55.9 (*) 35.0 - 45.0 mmHg   pO2, Arterial 67.0 (*) 80.0 - 100.0 mmHg   Bicarbonate 36.0 (*) 20.0 - 24.0 mEq/L   TCO2 38  0 - 100 mmol/L   O2 Saturation 93.0     Acid-Base Excess 10.0 (*) 0.0 - 2.0 mmol/L   Collection site RADIAL, ALLEN'S TEST ACCEPTABLE     Drawn by RT     Sample type ARTERIAL    MRSA PCR SCREENING      Component Value Range   MRSA by PCR NEGATIVE  NEGATIVE  PATHOLOGIST SMEAR REVIEW      Component Value Range   Path Review SMEAR STAINED AND AVAILABLE FOR REVIEW    COMPREHENSIVE METABOLIC PANEL      Component Value Range   Sodium 143  135 - 145 mEq/L   Potassium 3.1 (*) 3.5 - 5.1 mEq/L   Chloride 96  96 - 112 mEq/L   CO2 33 (*) 19 - 32 mEq/L   Glucose, Bld 76  70 - 99 mg/dL   BUN 59 (*) 6 - 23 mg/dL   Creatinine, Ser 1.61 (*) 0.50 - 1.10 mg/dL   Calcium 8.5  8.4 - 09.6 mg/dL   Total Protein 6.3  6.0 - 8.3 g/dL   Albumin 1.4 (*) 3.5 - 5.2 g/dL   AST 24  0 - 37 U/L   ALT <5  0 - 35 U/L   Alkaline Phosphatase 38 (*) 39 - 117 U/L   Total Bilirubin 0.1 (*) 0.3 - 1.2 mg/dL   GFR calc non Af Amer 17 (*) >90 mL/min   GFR calc Af Amer 19 (*) >90 mL/min  CBC WITH DIFFERENTIAL      Component Value Range   WBC 6.2  4.0 - 10.5 K/uL   RBC 2.75 (*) 3.87 - 5.11 MIL/uL   Hemoglobin 8.9 (*) 12.0 - 15.0 g/dL   HCT 04.5 (*) 40.9 - 81.1 %   MCV 103.3 (*) 78.0 - 100.0 fL   MCH 32.4  26.0 - 34.0 pg   MCHC 31.3  30.0 - 36.0 g/dL   RDW 91.4  78.2 - 95.6 %   Platelets 81 (*) 150 - 400 K/uL   Neutrophils Relative 61  43 - 77 %   Neutro Abs 3.8  1.7 - 7.7 K/uL   Lymphocytes Relative 25  12 - 46 %   Lymphs Abs 1.6  0.7 - 4.0 K/uL   Monocytes Relative 12  3 - 12 %   Monocytes  Absolute 0.7  0.1 - 1.0 K/uL   Eosinophils Relative 3  0 - 5 %   Eosinophils Absolute 0.2  0.0 - 0.7 K/uL   Basophils Relative 0  0 - 1 %   Basophils Absolute 0.0  0.0 - 0.1 K/uL  VALPROIC ACID LEVEL      Component Value Range   Valproic Acid Lvl 89.7  50.0 - 100.0 ug/mL  GLUCOSE, CAPILLARY      Component Value Range   Glucose-Capillary 80  70 - 99 mg/dL   Comment 1 Documented in Chart     Comment 2 Notify RN    GLUCOSE, CAPILLARY      Component  Value Range   Glucose-Capillary 92  70 - 99 mg/dL  GLUCOSE, CAPILLARY      Component Value Range   Glucose-Capillary 85  70 - 99 mg/dL   Comment 1 Notify RN     Comment 2 Documented in Chart    GLUCOSE, CAPILLARY      Component Value Range   Glucose-Capillary 99  70 - 99 mg/dL   Comment 1 Notify RN     Comment 2 Documented in Chart     A/P: Pt with hx of acute on chronic renal failure, ascites, anoxic brain injury, refractory volume overload; plan is for placement of HD catheter on 9/7. Details of above d/w pt's mother with her understanding and consent. Pt has had HD cath placed in past.

## 2011-11-27 NOTE — Progress Notes (Signed)
Utilization review completed.  

## 2011-11-27 NOTE — Progress Notes (Signed)
Pt arrived from ED.  Lethargic, but arousable.  Oriented X4.  Pt mother at bedside, able to answer admission history questions.  Will continue to monitor.    Maximino Greenland RN
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
## 2011-11-27 NOTE — Consult Note (Signed)
I have seen and examined this patient and agree with the plan of care , very difficult situation.   Have discussed with [name] who feels that Heather Sims has very difficult to manage blood pressures and volume: however mother feels that Heather Sims has done well for 7 weeks ( longest period without hospitalization all year!).   Unfortunately in last 1 week, ascites has worsened and is oliguric despite lasix. We had a long discussion about pros and cons of dialysis and mother feels like she would like Heather Sims to try. Heather Sims is tearful about the prospect but agrees and we shall have interventional place a catheter and start dialysis.  Heather Sims 11/27/2011, 3:04 PM

## 2011-11-28 ENCOUNTER — Inpatient Hospital Stay (HOSPITAL_COMMUNITY): Payer: Medicaid Other

## 2011-11-28 DIAGNOSIS — R578 Other shock: Secondary | ICD-10-CM

## 2011-11-28 DIAGNOSIS — J9600 Acute respiratory failure, unspecified whether with hypoxia or hypercapnia: Secondary | ICD-10-CM | POA: Diagnosis present

## 2011-11-28 DIAGNOSIS — I428 Other cardiomyopathies: Secondary | ICD-10-CM

## 2011-11-28 DIAGNOSIS — I959 Hypotension, unspecified: Secondary | ICD-10-CM | POA: Insufficient documentation

## 2011-11-28 LAB — PREPARE RBC (CROSSMATCH)

## 2011-11-28 LAB — PROTEIN, BODY FLUID: Total protein, fluid: 3.4 g/dL

## 2011-11-28 LAB — CK TOTAL AND CKMB (NOT AT ARMC): Total CK: 54 U/L (ref 7–177)

## 2011-11-28 LAB — CBC
MCH: 31.9 pg (ref 26.0–34.0)
MCV: 102.8 fL — ABNORMAL HIGH (ref 78.0–100.0)
Platelets: 69 10*3/uL — ABNORMAL LOW (ref 150–400)
RDW: 14.4 % (ref 11.5–15.5)

## 2011-11-28 LAB — BODY FLUID CELL COUNT WITH DIFFERENTIAL: Monocyte-Macrophage-Serous Fluid: 91 % — ABNORMAL HIGH (ref 50–90)

## 2011-11-28 LAB — RENAL FUNCTION PANEL
Albumin: 1.4 g/dL — ABNORMAL LOW (ref 3.5–5.2)
BUN: 60 mg/dL — ABNORMAL HIGH (ref 6–23)
Creatinine, Ser: 3.58 mg/dL — ABNORMAL HIGH (ref 0.50–1.10)
Phosphorus: 6.4 mg/dL — ABNORMAL HIGH (ref 2.3–4.6)
Potassium: 3.9 mEq/L (ref 3.5–5.1)

## 2011-11-28 LAB — TROPONIN I: Troponin I: <0.3 ng/mL (ref <0.30)

## 2011-11-28 LAB — LACTATE DEHYDROGENASE, PLEURAL OR PERITONEAL FLUID: LD, Fluid: 138 U/L — ABNORMAL HIGH (ref 3–23)

## 2011-11-28 LAB — ALBUMIN, FLUID (OTHER): Albumin, Fluid: 1.1 g/dL

## 2011-11-28 LAB — VITAMIN B12: Vitamin B-12: 1354 pg/mL — ABNORMAL HIGH (ref 211–911)

## 2011-11-28 MED ORDER — SODIUM CHLORIDE 0.9 % IV BOLUS (SEPSIS)
500.0000 mL | Freq: Once | INTRAVENOUS | Status: AC
  Administered 2011-11-28 (×2): 500 mL via INTRAVENOUS

## 2011-11-28 MED ORDER — SODIUM BICARBONATE 8.4 % IV SOLN: 50.0000 meq | Freq: Once | INTRAVENOUS | Status: AC

## 2011-11-28 MED ORDER — ALBUMIN HUMAN 25 % IV SOLN
25.0000 g | Freq: Once | INTRAVENOUS | Status: AC
  Administered 2011-11-28: 25 g via INTRAVENOUS
  Filled 2011-11-28: qty 100

## 2011-11-28 MED ORDER — PRISMASOL BGK 4/2.5 32-4-2.5 MEQ/L IV SOLN
INTRAVENOUS | Status: DC
  Administered 2011-11-29 – 2011-11-30 (×4): via INTRAVENOUS_CENTRAL
  Filled 2011-11-28 (×6): qty 5000

## 2011-11-28 MED ORDER — SODIUM CHLORIDE 0.9 % IV BOLUS (SEPSIS): 500.0000 mL | Freq: Once | INTRAVENOUS | Status: AC

## 2011-11-28 MED ORDER — VASOPRESSIN 20 UNIT/ML IJ SOLN
0.0300 [IU]/min | INTRAVENOUS | Status: DC
  Administered 2011-11-28: 0.03 [IU]/min via INTRAVENOUS
  Filled 2011-11-28: qty 2.5

## 2011-11-28 MED ORDER — SODIUM CHLORIDE 0.9 % IV BOLUS (SEPSIS)
500.0000 mL | Freq: Once | INTRAVENOUS | Status: AC
  Administered 2011-11-28: 500 mL via INTRAVENOUS

## 2011-11-28 MED ORDER — SODIUM BICARBONATE 8.4 % IV SOLN
INTRAVENOUS | Status: AC
  Administered 2011-11-28: 50 meq
  Filled 2011-11-28: qty 50

## 2011-11-28 MED ORDER — DOPAMINE-DEXTROSE 3.2-5 MG/ML-% IV SOLN
2.0000 ug/kg/min | INTRAVENOUS | Status: DC
  Administered 2011-11-28: 800000 ug via INTRAVENOUS

## 2011-11-28 MED ORDER — FENTANYL CITRATE 0.05 MG/ML IJ SOLN
INTRAMUSCULAR | Status: AC | PRN
  Administered 2011-11-28: 25 ug via INTRAVENOUS

## 2011-11-28 MED ORDER — CHLORHEXIDINE GLUCONATE 0.12 % MT SOLN
15.0000 mL | Freq: Two times a day (BID) | OROMUCOSAL | Status: DC
  Administered 2011-11-28: 15 mL via OROMUCOSAL
  Filled 2011-11-28: qty 15

## 2011-11-28 MED ORDER — NOREPINEPHRINE BITARTRATE 1 MG/ML IJ SOLN: 2.0000 ug/min | INTRAVENOUS | Status: DC

## 2011-11-28 MED ORDER — DIPHENHYDRAMINE HCL 50 MG/ML IJ SOLN: 25.0000 mg | Freq: Four times a day (QID) | INTRAMUSCULAR | Status: DC | PRN

## 2011-11-28 MED ORDER — PHENYLEPHRINE HCL 10 MG/ML IJ SOLN
30.0000 ug/min | INTRAVENOUS | Status: DC
  Administered 2011-11-28: 30 ug/min via INTRAVENOUS
  Administered 2011-11-28: 200 ug/min via INTRAVENOUS
  Filled 2011-11-28 (×2): qty 1

## 2011-11-28 MED ORDER — NOREPINEPHRINE BITARTRATE 1 MG/ML IJ SOLN
2.0000 ug/min | INTRAVENOUS | Status: DC
  Administered 2011-11-28: 20 ug/min via INTRAVENOUS
  Administered 2011-11-29: 60 ug/min via INTRAVENOUS
  Filled 2011-11-28 (×2): qty 16

## 2011-11-28 MED ORDER — PRISMASOL BGK 4/2.5 32-4-2.5 MEQ/L IV SOLN
INTRAVENOUS | Status: DC
  Administered 2011-11-29 – 2011-11-30 (×2): via INTRAVENOUS_CENTRAL
  Filled 2011-11-28 (×3): qty 5000

## 2011-11-28 MED ORDER — SODIUM CHLORIDE 0.9 % IV BOLUS (SEPSIS)
1000.0000 mL | Freq: Once | INTRAVENOUS | Status: AC
  Administered 2011-11-28: 1000 mL via INTRAVENOUS

## 2011-11-28 MED ORDER — DOPAMINE-DEXTROSE 3.2-5 MG/ML-% IV SOLN
INTRAVENOUS | Status: AC
  Administered 2011-11-28: 800000 ug via INTRAVENOUS
  Filled 2011-11-28: qty 250

## 2011-11-28 MED ORDER — PIPERACILLIN-TAZOBACTAM 3.375 G IVPB 30 MIN
3.3750 g | Freq: Four times a day (QID) | INTRAVENOUS | Status: DC
  Filled 2011-11-28: qty 50

## 2011-11-28 MED ORDER — PRISMASOL BGK 4/2.5 32-4-2.5 MEQ/L IV SOLN
INTRAVENOUS | Status: DC
  Administered 2011-11-29 – 2011-12-01 (×15): via INTRAVENOUS_CENTRAL
  Filled 2011-11-28 (×25): qty 5000

## 2011-11-28 MED ORDER — MIDAZOLAM HCL 5 MG/ML IJ SOLN: 1.0000 mg | INTRAMUSCULAR | Status: DC | PRN

## 2011-11-28 MED ORDER — ALTEPLASE 2 MG IJ SOLR
2.0000 mg | Freq: Once | INTRAMUSCULAR | Status: AC | PRN
  Filled 2011-11-28: qty 2

## 2011-11-28 MED ORDER — VANCOMYCIN HCL 1000 MG IV SOLR
1250.0000 mg | Freq: Once | INTRAVENOUS | Status: AC
  Administered 2011-11-28: 1250 mg via INTRAVENOUS
  Filled 2011-11-28: qty 1250

## 2011-11-28 MED ORDER — PANTOPRAZOLE SODIUM 40 MG IV SOLR
40.0000 mg | INTRAVENOUS | Status: DC
  Administered 2011-11-28 – 2011-12-01 (×4): 40 mg via INTRAVENOUS
  Filled 2011-11-28 (×6): qty 40

## 2011-11-28 MED ORDER — INSULIN ASPART 100 UNIT/ML ~~LOC~~ SOLN
0.0000 [IU] | SUBCUTANEOUS | Status: DC
  Administered 2011-11-28: 5 [IU] via SUBCUTANEOUS
  Administered 2011-11-29: 3 [IU] via SUBCUTANEOUS

## 2011-11-28 MED ORDER — HEPARIN SODIUM (PORCINE) 5000 UNIT/ML IJ SOLN
5000.0000 [IU] | Freq: Three times a day (TID) | INTRAMUSCULAR | Status: DC
  Filled 2011-11-28 (×2): qty 1

## 2011-11-28 MED ORDER — FENTANYL CITRATE 0.05 MG/ML IJ SOLN
100.0000 ug | INTRAMUSCULAR | Status: DC | PRN
  Administered 2011-11-29 (×3): 100 ug via INTRAVENOUS
  Filled 2011-11-28 (×3): qty 2

## 2011-11-28 MED ORDER — ALBUMIN HUMAN 5 % IV SOLN
INTRAVENOUS | Status: AC
  Filled 2011-11-28: qty 250

## 2011-11-28 MED ORDER — BIOTENE DRY MOUTH MT LIQD
1.0000 "application " | Freq: Four times a day (QID) | OROMUCOSAL | Status: DC
  Administered 2011-11-29 (×2): 15 mL via OROMUCOSAL

## 2011-11-28 MED ORDER — HEPARIN SODIUM (PORCINE) 1000 UNIT/ML DIALYSIS
1000.0000 [IU] | INTRAMUSCULAR | Status: DC | PRN
  Administered 2011-12-01: 3700 [IU] via INTRAVENOUS_CENTRAL
  Filled 2011-11-28: qty 6
  Filled 2011-11-28: qty 1

## 2011-11-28 MED ORDER — ALBUMIN HUMAN 25 % IV SOLN
12.5000 g | Freq: Once | INTRAVENOUS | Status: AC
  Administered 2011-11-28: 12.5 g via INTRAVENOUS
  Filled 2011-11-28: qty 50

## 2011-11-28 MED ORDER — MIDAZOLAM HCL 5 MG/5ML IJ SOLN
INTRAMUSCULAR | Status: AC | PRN
  Administered 2011-11-28: 0.5 mg via INTRAVENOUS

## 2011-11-28 MED ORDER — SODIUM CHLORIDE 0.9 % IV SOLN
28.0000 ug | Freq: Once | INTRAVENOUS | Status: AC
  Administered 2011-11-29: 28 ug via INTRAVENOUS
  Filled 2011-11-28: qty 7

## 2011-11-28 MED ORDER — VANCOMYCIN HCL 1000 MG IV SOLR
750.0000 mg | INTRAVENOUS | Status: DC
  Filled 2011-11-28: qty 750

## 2011-11-28 MED ORDER — CEFAZOLIN SODIUM 1-5 GM-% IV SOLN
1.0000 g | Freq: Once | INTRAVENOUS | Status: AC
  Administered 2011-11-28: 1 g via INTRAVENOUS

## 2011-11-28 MED ORDER — SODIUM CHLORIDE 0.9 % FOR CRRT
INTRAVENOUS_CENTRAL | Status: DC | PRN
  Filled 2011-11-28: qty 1000

## 2011-11-28 NOTE — Progress Notes (Signed)
[facility] Progress Note [place] TEAM 1 - Stepdown/ICU TEAM   JIMESHA RISING ZOX:[id number] DOB: August 25, 1990 DOA: 11/26/2011 PCP: Jaclyn Shaggy, MD  Brief narrative: 21 year-old female with history of nephropathy, anoxic brain injury, seizure, cardiomyopathy, hypertension was found to be hypoxic as noticed by patient's home health care nurse. In addition patient's mother also noticed that patient is increasingly somnolent over the last 4-5 days. Patient's ammonia level was found to be high week ago by patient's neurologist and was started on Keppra and the plan was to taper off Depakote. In the ER patient's Depakote levels were found to be high more than therapeutic. Patient's chest x-ray showed infiltrates concerning for pneumonia. Patient also has tense ascites with increased lower extremity fluid. Patient has been admitted for further management  Assessment/Plan:  Acute encephalopathy CT scan of head without acute findings - ammonia level is within normal range - urinalysis is without suggestion of urinary tract infection - Depakote level was markedly elevated at presentation - perhaps this is primarily Depakote induced lethargy plus or minus uremia - B12 and folic acid levels pending  Acute on chronic renal failure - MPGN (membranoproliferative glomerulonephritis), type 2  Baseline Cr 2.3 - nephrology is following, and today is concerned that the patient is intravascularly volume depleted - creatinine remains essentially stable today  Hypoxia The patient is afebrile - her hypoxia has essentially resolved - her white blood cell count is not significantly elevated - at this time I find no overwhelming evidence for an infectious pulmonary process - I will discontinue antibiotics and follow clinically - I suspect her hypoxia is due to simple pulmonary edema, or possibly aspiration pneumonitis - followup chest x-ray in a.m.  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 - will obtain followup echocardiogram to rule out significant pericardial effusion and also to reassess for recovery of left ventricular function  Mild hypokalemia Follow without treatment in setting of acute renal failure - improved today  Macrocytic anemia Check B12 and folic acid levels - is likely multifactorial with contributions from chronic renal disease, Depakote toxicity, and possible B12 or folic acid deficiencies  Thrombocytopenia Likely related to Depakote toxicity - no evidence of acute blood loss - follow trend  History of anoxic brain injury s/p PEA arrest January 2013 secondary to cardio-respiratory failure from pulmonary edema secondary to acute renal failure   History of hypertension Blood pressure is currently well-controlled  Code Status: Full Disposition Plan: Remain in step down unit  Consultants: Nephrology Interventional Radiology  Procedures: 11/28/2011 placement of temporary hemodialysis catheter 11/28/2011 therapeutic and diagnostic paracentesis  Antibiotics: Levaquin 11/26/2011>>11/27/2011 Maxipime 11/26/2011>>11/27/2011 Vancomycin 11/26/2011>>11/27/2011  DVT prophylaxis: SCDs only at this time as patient is to have hemodialysis catheter placed  HPI/Subjective: The patient is seen status post paracentesis.  She is more alert and interactive at this time.  She appears  comfortable.  She denies complaints.  She has informed her mother  that she is hungry and would like to eat.   Objective: Blood pressure 113/83, pulse 75, temperature 98.1 F (36.7 C), temperature source Oral, resp. rate 19, height 5\' 2"  (1.575 m), weight 71.3 kg (157 lb 3 oz), last menstrual period 10/08/2011, SpO2 97.00%.  Intake/Output Summary (Last 24 hours) at 11/28/11 1334 Last data filed at 11/28/11 1207  Gross per 24 hour  Intake    926 ml  Output    560 ml  Net    366 ml    Exam: General: No acute respiratory distress at present Lungs: Fine crackles appreciated diffusely with no wheeze Cardiovascular: Regular rate and rhythm without murmur gallop or rub normal S1 and S2 Abdomen: Ascites has been drained and abdomen is now flat, no rebound no appreciable focal mass - bowel sounds positive Extremities: 1+ bilateral lower extremity edema without cyanosis or clubbing  Data Reviewed: Basic Metabolic Panel:  Lab 11/28/11 1610 11/27/11 0330 11/26/11 1718  NA 140 143 142  K 3.9 3.1* 3.3*  CL 94* 96 95*  CO2 32 33* 33*  GLUCOSE 86 76 81  BUN 60* 59* 60*  CREATININE 3.58* 3.63* 3.78*  CALCIUM 8.5 8.5 8.5  MG -- -- --  PHOS 6.4* -- --   Liver Function Tests:  Lab 11/28/11 0415 11/27/11 0330 11/26/11 1718  AST -- 24 23  ALT -- <5 <5  ALKPHOS -- 38* 39  BILITOT -- 0.1* 0.2*  PROT -- 6.3 6.5  ALBUMIN 1.4* 1.4* 1.5*    Lab 11/26/11 1718  AMMONIA 42   CBC:  Lab 11/28/11 0400 11/27/11 0249 11/26/11 1718  WBC 6.6 6.2 6.3  NEUTROABS -- 3.8 --  HGB 9.0* 8.9* 9.3*  HCT 29.0* 28.4* 30.1*  MCV 102.8* 103.3* 102.7*  PLT 69* 81* 84*   BNP (last 3 results)  Basename 03/26/11 2140  PROBNP 11557.0*   CBG:  Lab 11/27/11 1047 11/27/11 0734 11/27/11 0404 11/27/11 0208  GLUCAP 99 85 92 80    Recent Results (from the past 240 hour(s))  CULTURE, BLOOD (ROUTINE X 2)     Status: Normal (Preliminary result)   Collection Time   11/26/11  9:25 PM      Component Value Range  Status Comment   Specimen Description BLOOD ARM LEFT   Final    Special Requests BOTTLES DRAWN AEROBIC AND ANAEROBIC 10CC   Final    Culture  Setup Time 11/27/2011 04:16   Final    Culture     Final    Value:        BLOOD CULTURE RECEIVED NO GROWTH TO DATE CULTURE WILL BE HELD FOR 5 DAYS BEFORE ISSUING A FINAL NEGATIVE REPORT   Report Status PENDING   Incomplete   CULTURE, BLOOD (ROUTINE X 2)     Status: Normal (Preliminary result)   Collection Time   11/26/11  9:35 PM      Component Value Range Status Comment   Specimen Description BLOOD ARM LEFT   Final    Special Requests BOTTLES DRAWN AEROBIC AND ANAEROBIC 10CC   Final    Culture  Setup Time 11/27/2011 04:16   Final    Culture     Final    Value:        BLOOD CULTURE RECEIVED NO GROWTH TO DATE CULTURE WILL BE HELD FOR 5 DAYS BEFORE ISSUING A FINAL NEGATIVE REPORT   Report Status PENDING   Incomplete   MRSA PCR SCREENING     Status: Normal  Collection Time   11/27/11  1:56 AM      Component Value Range Status Comment   MRSA by PCR NEGATIVE  NEGATIVE Final      Studies:  Recent x-ray studies have been reviewed in detail by the Attending Physician  Scheduled Meds:  Reviewed in detail by the Attending Physician   Lonia Blood, MD [facility] Office  [phone] Pager [phone]  On-Call/Text Page:      [email or web address]      password [email or web address]  If 7PM-7AM, please contact night-coverage [email or web address] Password [email or web address] 11/28/2011, 1:34 PM   LOS: 2 days

## 2011-11-28 NOTE — Progress Notes (Signed)
[facility] ROUNDING NOTE   Subjective:   Interval History: had a good night, ate fair  Objective:  Vital signs in last 24 hours:  Temp:  [97.4 F (36.3 C)-98.7 F (37.1 C)] 98.7 F (37.1 C) (09/07 0800) Pulse Rate:  [69-76] 76  (09/07 0800) Resp:  [11-24] 21  (09/07 0800) BP: (109-119)/(79-89) 116/79 mmHg (09/07 0800) SpO2:  [96 %-98 %] 98 % (09/07 0800) Weight:  [71.3 kg (157 lb 3 oz)] 71.3 kg (157 lb 3 oz) (09/07 0400)  Weight change: 16.868 kg (37 lb 3 oz) Filed Weights   11/26/11 2300 11/27/11 0200 11/28/11 0400  Weight: 54.432 kg (120 lb) 66.4 kg (146 lb 6.2 oz) 71.3 kg (157 lb 3 oz)    Intake/Output: I/O last 3 completed shifts: In: 1192 [P.O.:760; IV Piggyback:432] Out: 710 [Urine:710]   Intake/Output this shift:  Total I/O In: -  Out: 100 [Urine:100]  Mucus membranes dry CVS- RRR RS- CTA ABD- Distend and tense with ascites EXT- no edema   Basic Metabolic Panel:  Lab 11/28/11 1610 11/27/11 0330 11/26/11 1718  NA 140 143 142  K 3.9 3.1* 3.3*  CL 94* 96 95*  CO2 32 33* 33*  GLUCOSE 86 76 81  BUN 60* 59* 60*  CREATININE 3.58* 3.63* 3.78*  CALCIUM 8.5 8.5 8.5  MG -- -- --  PHOS 6.4* -- --    Liver Function Tests:  Lab 11/28/11 0415 11/27/11 0330 11/26/11 1718  AST -- 24 23  ALT -- <5 <5  ALKPHOS -- 38* 39  BILITOT -- 0.1* 0.2*  PROT -- 6.3 6.5  ALBUMIN 1.4* 1.4* 1.5*   No results found for this basename: LIPASE:5,AMYLASE:5 in the last 168 hours  Lab 11/26/11 1718  AMMONIA 42    CBC:  Lab 11/28/11 0400 11/27/11 0249 11/26/11 1718  WBC 6.6 6.2 6.3  NEUTROABS -- 3.8 --  HGB 9.0* 8.9* 9.3*  HCT 29.0* 28.4* 30.1*  MCV 102.8* 103.3* 102.7*  PLT 69* 81* 84*    Cardiac Enzymes: No results found for this basename: CKTOTAL:5,CKMB:5,CKMBINDEX:5,TROPONINI:5 in the last 168 hours  BNP: No components found with this basename: POCBNP:5  CBG:  Lab 11/27/11 1047 11/27/11 0734 11/27/11 0404 11/27/11 0208  GLUCAP 99 85 92 80     Microbiology: Results for orders placed during the hospital encounter of 11/26/11  MRSA PCR SCREENING     Status: Normal   Collection Time   11/27/11  1:56 AM      Component Value Range Status Comment   MRSA by PCR NEGATIVE  NEGATIVE Final     Coagulation Studies:  Basename 11/28/11 0400  LABPROT 15.8*  INR 1.23    Urinalysis:  Basename 11/26/11 1736  COLORURINE YELLOW  LABSPEC 1.017  PHURINE 5.0  GLUCOSEU NEGATIVE  HGBUR LARGE*  BILIRUBINUR NEGATIVE  KETONESUR NEGATIVE  PROTEINUR NEGATIVE  UROBILINOGEN 0.2  NITRITE NEGATIVE  LEUKOCYTESUR TRACE*      Imaging: Dg Chest 2 View  11/26/2011  *RADIOLOGY REPORT*  Clinical Data: Altered mental status.  Low oxygen saturations.  CHEST - 2 VIEW  Comparison: Chest x-ray 09/11/2011 (part of the acute abdominal series).  Findings: Lung volumes are very low.  There is extensive airspace consolidation and air bronchograms throughout the entire left mid and lower lung, as well as the right lower lung.  This completely obscures the cardiac silhouette.  Pulmonary vascular crowding without frank pulmonary edema.  Probable left pleural effusion (likely moderate).  No definite right pleural effusion. Mediastinal  contours are distorted and obscured.  IMPRESSION: 1.  Extensive airspace consolidation throughout the lung bases and the left mid lung, concerning for acute infection.  Given the predominant basilar distribution, this may represent sequelae of large volume aspiration. 2.  Left pleural effusion likely moderate. 3.  Low lung volumes.   Original Report Authenticated By: Florencia Reasons, M.D.    Ct Head Wo Contrast  11/27/2011  *RADIOLOGY REPORT*  Clinical Data: Drowsiness.  CT HEAD WITHOUT CONTRAST  Technique:  Contiguous axial images were obtained from the base of the skull through the vertex without contrast.  Comparison: None.  Findings: The ventricles and subarachnoid spaces are prominent for age consistent with atrophy.  There is no  evidence of acute intracranial hemorrhage, mass lesion, brain edema or extra-axial fluid collection.  There is no evidence of acute infarction.  The visualized paranasal sinuses are clear.  The calvarium is intact.  IMPRESSION:  1.  Moderate atrophy for age. 2.  No acute intracranial findings.   Original Report Authenticated By: Gerrianne Scale, M.D.    Dg Chest Port 1 View  11/27/2011  *RADIOLOGY REPORT*  Clinical Data: Follow up infiltrate  PORTABLE CHEST - 1 VIEW  Comparison: 11/26/2011  Findings: Low lung volumes.  Patchy left mid/lower lobe opacities, unchanged.  The heart is top norma in size for inspiration.  No pneumothorax is seen.  IMPRESSION: Patchy left mid/lower lobe opacities, unchanged.   Original Report Authenticated By: Charline Bills, M.D.    Dg Abd 2 Views  11/26/2011  *RADIOLOGY REPORT*  Clinical Data: Abdominal distension.  Shortness of breath.  History of congestive heart failure.  ABDOMEN - 2 VIEW  Comparison: 10/11/2011 radiographs; CT 08/24/2011.  Findings: The abdomen is nearly gasless.  There is diffuse soft tissue density throughout the abdomen consistent with worsening ascites.  There is no evidence of bowel obstruction or pneumoperitoneum.  There are no suspicious calcifications.  Osseous structures appear normal.  IMPRESSION: Worsening ascites.   Original Report Authenticated By: Gerrianne Scale, M.D.      Medications:        . amLODipine  5 mg Oral Daily  . carvedilol  25 mg Oral BID WC  . darbepoetin  60 mcg Subcutaneous Q Wed-1800  . ferrous sulfate  325 mg Oral TID WC  . furosemide  160 mg Intravenous Q6H  . levETIRAcetam  250 mg Oral BID  . potassium chloride  10 mEq Intravenous Q1 Hr x 4  . sodium bicarbonate  650 mg Oral TID  . DISCONTD: amLODipine  10 mg Oral Daily  . DISCONTD: ceFEPime (MAXIPIME) IV  1 g Intravenous Q24H  . DISCONTD: levofloxacin (LEVAQUIN) IV  500 mg Intravenous Q48H  . DISCONTD: sodium chloride  3 mL Intravenous Q12H  .  DISCONTD: sodium chloride  3 mL Intravenous Q12H  . DISCONTD: vancomycin  750 mg Intravenous Q48H   acetaminophen, acetaminophen, ondansetron (ZOFRAN) IV, ondansetron  Assessment/ Plan:   Acute on Chronic renal failure. I suspect patient is intravascularly volume depleted and massive ascites. I would stop lasix for now. Catheter placed should dialysis be required. This was exacerbated by ARB.  Electrolytes and acid base stable  Ascites I would think a large volume paracentesis as well as diagnostic studies should be performed  Anemia stable  Very tricky situation. I think the most effective way of managing the ascites would be paracentesis. Avoid heparin with declining platelet count. Consider underlying liver disease.   LOS: 2 Heather Sims @TODAY @9 :50 AM

## 2011-11-28 NOTE — Progress Notes (Signed)
Pt's SBP 60s-70s.  HR 70s. Pt mental status has not changed from earlier today.  Dr. Sharon Seller notified.  Orders received.  Mom at bedside.  Roselie Awkward, RN

## 2011-11-28 NOTE — Progress Notes (Addendum)
ANTIBIOTIC CONSULT NOTE - INITIAL  Pharmacy Consult for zosyn, vancomycin Indication: septic shock  No Known Allergies  Patient Measurements: Height: 5\' 2"  (157.5 cm) Weight: 157 lb 3 oz (71.3 kg) IBW/kg (Calculated) : 50.1   Vital Signs: Temp: 98.5 F (36.9 C) (09/07 2000) Temp src: Oral (09/07 2000) BP: 66/27 mmHg (09/07 1945) Pulse Rate: 65  (09/07 1945) Intake/Output from previous day: 09/06 0701 - 09/07 0700 In: 1192 [P.O.:760; IV Piggyback:432] Out: 460 [Urine:460] Intake/Output from this shift:    Labs:  Basename 11/28/11 0415 11/28/11 0400 11/27/11 0330 11/27/11 0249 11/26/11 1718  WBC -- 6.6 -- 6.2 6.3  HGB -- 9.0* -- 8.9* 9.3*  PLT -- 69* -- 81* 84*  LABCREA -- -- -- -- --  CREATININE 3.58* -- 3.63* -- 3.78*   Estimated Creatinine Clearance: 22.8 ml/min (by C-G formula based on Cr of 3.58). No results found for this basename: VANCOTROUGH:2,VANCOPEAK:2,VANCORANDOM:2,GENTTROUGH:2,GENTPEAK:2,GENTRANDOM:2,TOBRATROUGH:2,TOBRAPEAK:2,TOBRARND:2,AMIKACINPEAK:2,AMIKACINTROU:2,AMIKACIN:2, in the last 72 hours    Medical History: Past Medical History  Diagnosis Date  . Pneumonia     'walking pneumonia'  . Seizures   . Anemia   . Heart attack   . Cardiomyopathy   . Heart failure   . CHF (congestive heart failure)   . Ileitis   . Chronic kidney disease   . Renal disorder   . MPGN (membranoproliferative glomerulonephritis), type 2   . Hypertension   . Brain injury     Medications:  Prescriptions prior to admission  Medication Sig Dispense Refill  . amLODipine (NORVASC) 10 MG tablet Take 10 mg by mouth daily.      . carvedilol (COREG) 25 MG tablet Take 25 mg by mouth 2 (two) times daily with a meal.      . clonazePAM (KLONOPIN) 0.5 MG tablet Take 0.5 mg by mouth 2 (two) times daily.      . darbepoetin (ARANESP) 60 MCG/0.3ML SOLN Inject 60 mcg into the skin every Wednesday at 6 PM.      . divalproex (DEPAKOTE) 250 MG DR tablet Take 750 mg by mouth 3 (three)  times daily.      . ferrous sulfate 325 (65 FE) MG tablet Take 325 mg by mouth 3 (three) times daily.      . furosemide (LASIX) 80 MG tablet Take 240 mg by mouth 2 (two) times daily.      Marland Kitchen levETIRAcetam (KEPPRA) 250 MG tablet Take 250 mg by mouth 2 (two) times daily.      Marland Kitchen losartan (COZAAR) 100 MG tablet Take 100 mg by mouth daily.      . sodium bicarbonate 650 MG tablet Take 650 mg by mouth 3 (three) times daily.       Assessment: 21 yo female with hypotension after paracentesis and starting pressors. Patient to start empiric antibiotic coverage with zosyn/vancomycin. She is noted with acute on chronic renal failure on CRRT. New cultures are pending   Plan:  -Zosyn 3.375gm IV q6h -Vancomycin 1250 mg x1 followed by 750mg  IV q24hr for CVVH dosing -Will follow cultures and renal status  Thank you for asking pharmacy to be involved in the care of this patient.  Harland German, Pharm D 11/28/2011 9:11 PM

## 2011-11-28 NOTE — Progress Notes (Signed)
Pt transferred to 2110 via bed with Rapid Response RN.  Report given bedside to [facility].  Family in waiting room.  Roselie Awkward, RN

## 2011-11-28 NOTE — Consult Note (Addendum)
Name: Heather Sims MRN: [id number] DOB: 05-03-1990    LOS: 2  Referring Provider:  [employer] Reason for Referral:  Hypotension / altered mental status  PULMONARY / CRITICAL CARE MEDICINE  The patient is encephalopathic and unable to provide history, which was obtained for available medical records.  HPI:  21 yo with C3 nephropathy, anoxic brain injury, seizure, cardiomyopathy, hypertension admitted on 9/5 with hypoxia and encephalopathy on 9/5 who developed hypotension / worsening of altered mental status after large volume (10 L) paracentesis. Shortly after arrival to ICU she was noted to have worsening hypotension and poor mentals status.  She was intubated.  During central line placement blood was noted to be dilute.  Labs obtained showing profound anemia.  Past Medical History  Diagnosis Date  . Pneumonia     'walking pneumonia'  . Seizures   . Anemia   . Heart attack   . Cardiomyopathy   . Heart failure   . CHF (congestive heart failure)   . Ileitis   . Chronic kidney disease   . Renal disorder   . MPGN (membranoproliferative glomerulonephritis), type 2   . Hypertension   . Brain injury    Past Surgical History  Procedure Date  . Renal biopsy    Prior to Admission medications   Medication Sig Start Date End Date Taking? Authorizing Provider  amLODipine (NORVASC) 10 MG tablet Take 10 mg by mouth daily.   Yes Historical Provider, MD  carvedilol (COREG) 25 MG tablet Take 25 mg by mouth 2 (two) times daily with a meal.   Yes Historical Provider, MD  clonazePAM (KLONOPIN) 0.5 MG tablet Take 0.5 mg by mouth 2 (two) times daily.   Yes Historical Provider, MD  darbepoetin (ARANESP) 60 MCG/0.3ML SOLN Inject 60 mcg into the skin every Wednesday at 6 PM.   Yes Historical Provider, MD  divalproex (DEPAKOTE) 250 MG DR tablet Take 750 mg by mouth 3 (three) times daily.   Yes Historical Provider, MD  ferrous sulfate 325 (65 FE) MG tablet Take 325 mg by mouth 3 (three) times daily.   Yes  Historical Provider, MD  furosemide (LASIX) 80 MG tablet Take 240 mg by mouth 2 (two) times daily.   Yes Historical Provider, MD  levETIRAcetam (KEPPRA) 250 MG tablet Take 250 mg by mouth 2 (two) times daily.   Yes Historical Provider, MD  losartan (COZAAR) 100 MG tablet Take 100 mg by mouth daily.   Yes Historical Provider, MD  sodium bicarbonate 650 MG tablet Take 650 mg by mouth 3 (three) times daily.   Yes Historical Provider, MD   Allergies No Known Allergies  Family History Family History  Problem Relation Age of Onset  . Hypertension Mother   . Diabetes Father    Social History  reports that she has never smoked. She has never used smokeless tobacco. She reports that she does not drink alcohol or use illicit drugs.  Review Of Systems:  Unable to provide.  Brief patient description:  21 yo with C3 nephropathy, anoxic brain injury, seizure, cardiomyopathy, hypertension admitted on 9/5 with hypoxia and encephalopathy on 9/5 who developed hypotension / worsening of altered mental status after large volume (10 L) paracentesis.   Events Since Admission: 9/5  Admitted for hypoxia / encephalopathy 9/7  Large volume paracentesis.  Hypotensive requiring pressors.  Encephalopathic requiring intubation.  Transferred to ICU. Profound anemia requiring massive transfusion.  Current Status:  Vital Signs: Temp:  [97.8 F (36.6 C)-98.7 F (37.1 C)] 98.5 F (  36.9 C) (09/07 2000) Pulse Rate:  [65-81] 65  (09/07 1945) Resp:  [0-24] 18  (09/07 1945) BP: (51-135)/(22-100) 66/27 mmHg (09/07 1945) SpO2:  [95 %-100 %] 99 % (09/07 1945) Weight:  [71.3 kg (157 lb 3 oz)] 71.3 kg (157 lb 3 oz) (09/07 0400)  Physical Examination: General:  Obtunded, intubated Neuro:  Spontaneously moving extremities after intubation. Not following commands. HEENT:  PERRL, ETT in place Neck:  Supple Cardiovascular:  RRR Lungs:  Coarse bilateral breath sounds. Abdomen:  Increasing distention, minimal  BS Musculoskeletal:  Joints wnl Skin:  No rash  Active Problems:  Seizures  Encephalopathy acute  Renal failure (ARF), acute on chronic  Healthcare-associated pneumonia  Cardiomyopathy  Acute respiratory failure  Hypotension  ASSESSMENT AND PLAN  PULMONARY  Lab 11/26/11 2257  PHART 7.417  PCO2ART 55.9*  PO2ART 67.0*  HCO3 36.0*  O2SAT 93.0   Ventilator Settings:   CXR:   9/7 Left internal jugular venous catheter tip is projected over the  confluence of the brachial cephalic vein and SVC. Appliances are  otherwise stable. Persistent cardiac enlargement with pulmonary  vascular congestion. Basilar atelectasis or consolidation.  ETT:  9/7 >>>  A:  Acute omn chronic respiratory failure.  Intubated secondary to inability to protect airway.  Concern for developing ARDS or pulmonary edema. P:   Full mechanical support ABG PCXR [place] SBT  CARDIOVASCULAR  Lab 11/26/11 1716  TROPONINI --  LATICACIDVEN 0.7  PROBNP --   ECG:  9/5 >>> Sinus rhythm, diffuse St-T changes Lines: l L IJ 9/7 >>> Femoral a-line 9/7 >>>  A: Hypotension likely secondary to hemorraghic shock given profound drop in HGB.  Hypovolemia related to large volume paracentesis / hypoalbuminemia possibly contributing.  Cardiomyopathy. "Haert attack" by history.  S/p PEA arrest. P:  Goal MAP 60-65 Place CVL Fluids as below Transfused pRBC, FFP and platelets Albumin 25% 25 g x 1 + 50g x1 Levophed, dopamine, and vaso gtt TTE  RENAL  Lab 11/28/11 0415 11/27/11 0330 11/26/11 1718  NA 140 143 142  K 3.9 3.1* --  CL 94* 96 95*  CO2 32 33* 33*  BUN 60* 59* 60*  CREATININE 3.58* 3.63* 3.78*  CALCIUM 8.5 8.5 8.5  MG -- -- --  PHOS 6.4* -- --   Intake/Output      09/07 0701 - 09/08 0700   P.O.    IV Piggyback    Total Intake(mL/kg)    Urine (mL/kg/hr) 375 (0.4)   Total Output 375   Net -375         A:  C3 nephropathy.  Chronic renal failure.  Uremia. P:   CVVHD per renal when more  stable NS boluses to goal CVP of 10-12 Trend BMP  GASTROINTESTINAL  Lab 11/28/11 0415 11/27/11 0330 11/26/11 1718  AST -- 24 23  ALT -- <5 <5  ALKPHOS -- 38* 39  BILITOT -- 0.1* 0.2*  PROT -- 6.3 6.5  ALBUMIN 1.4* 1.4* 1.5*   A:  Ascites, likely secondary to hypoalbuminemia.  Severe malnutrition.  Normal Ammonia (42). P:   NPO as intubated Holding TF for now  HEMATOLOGIC  Lab 11/29/11 0224 11/28/11 2230 11/28/11 0400 11/27/11 0249 11/26/11 1718  HGB 14.6 1.3* 9.0* 8.9* 9.3*  HCT 41.8 4.3* 29.0* 28.4* 30.1*  PLT 46* 25* 69* 81* 84*  INR -- -- 1.23 -- --  APTT 41* -- 40* -- --   A:  Chronic anemia.  Thrombocytopenia.  Likely hemorrhage into abdomen P:  Repeat CBC following transfusion and monitor for resolution of bleeding. Cycle CBCs Q4 Surgery contacted, unlikely surgical candidate VIR aware, if continued bleeding and stabilized would consider angiogram for possible embolization Aranesp Ferrous sulfate  INFECTIOUS  Lab 11/28/11 0400 11/27/11 0249 11/26/11 1718  WBC 6.6 6.2 6.3  PROCALCITON -- -- --   Cultures: 9/5  Blood >>> 9/7  Blood >>>  Antibiotics: Cefazolin 9/7 x1 Zosyn (empirical, intraabdominal sepsis / line sepsis) 9/7 >>> Vancomycin (empirical, line sepsis) 9/7 >>>  A:  Possible intraabdominal sepsis s/p paracentesis.  Possible line sepsis s/p HD catheter placement. P:   Antibiotics cultures as above PCT level  ENDOCRINE  Lab 11/28/11 1902 11/27/11 1047 11/27/11 0734 11/27/11 0404 11/27/11 0208  GLUCAP 127* 99 85 92 80   A:  Hyperglycemia.  Unknown adrenal function. P:   SSI/CBG Cortisol level  NEUROLOGIC  A:  Acute on chronic encephalopathy (anoxia, uremia).  History of seizures. P:   Fentanyl / Versed for synchrony Keppra  BEST PRACTICE / DISPOSITION Level of Care:  ICU Primary Service:  PCCM, back to [facility] when downgraded  Consultants:  Renal Code Status:  Full Diet:  NPO DVT Px:  SCDs GI Px:  Protonix Skin Integrity:  No  issues Social / Family:  Parent updated multiple times overnight.  The patient is critically ill with multiple organ systems failure and requires high complexity decision making for assessment and support, frequent evaluation and titration of therapies, application of advanced monitoring technologies and extensive interpretation of multiple databases. Critical Care Time devoted to patient care services described in this note is 105 minutes.   Osmara Drummonds, M.D. Pulmonary and Critical Care Medicine [facility] Pager: [phone]  11/28/2011, 8:19 PM

## 2011-11-28 NOTE — Progress Notes (Signed)
BP dropped after paracentesis and HD catheter placement.  BP was in 50's, now up to 70's after NS boluses and IV pressors.  10 L clear fluid removed at paracentesis. Pt is lethargic, listless, 2+ thigh edema on exam, crackles L lung base, R is clear, no obvious JVD.  Abdomen soft.    Plan is to proceed with CRRT once BP recovers.  See orders.  Vinson Moselle  MD [facility] [phone] pgr    [phone] cell 11/28/2011, 8:16 PM

## 2011-11-28 NOTE — Progress Notes (Signed)
Agree with above.  Proceed with Permacatheter placement.   Signed,  Sterling Big, MD Vascular & Interventional Radiologist [facility] Radiology

## 2011-11-28 NOTE — Progress Notes (Signed)
BP 57/31. Pt becoming more lethargic and more difficult to arouse.  Dr. Sharon Seller notified.  CCM to be consulted.  Dr. Herma Carson called with new orders.  Pt to be transferred to 2100.  Roselie Awkward, RN

## 2011-11-28 NOTE — Procedures (Signed)
Interventional Radiology Procedure Note  Procedure: 1.) Successful placement of a right IJ Permacatheter.  Tip in the RA and ready for dialysis use. 2.) Therapeutic large volume paracentesis. 10L clear yellow ascites aspirated  Complications: None Recommendations: - May dialyze as needed Signed,  Sterling Big, MD Vascular & Interventional Radiologist [facility] Radiology

## 2011-11-28 NOTE — Progress Notes (Signed)
Called by bedside RN to come and see patient for low BP in the 60's.  Upon arrival to patient's room RN at bedside and family.  Patient is very lethargic, however is arousable and communicating. Patient is on nasal cannula, pale.  HR 70's Spo2 97%, 500cc NS Bolus started.  MD paged and notified and orders received.  Lab attempted  To obtain labs, unsuccessful.  HD catheter accessed by IV-RN, as per MD order.   Dr. Herma Carson on phone--Patient given a total of 2 l NS bolus.  Neo drip started at 75mcg/min at 1830 SBP 67, increased to 40 mcg at 1845.  Patient transported to 2110, via bed with monitor and oxygen in place.  RRT report given at bedside

## 2011-11-29 ENCOUNTER — Inpatient Hospital Stay (HOSPITAL_COMMUNITY): Payer: Medicaid Other

## 2011-11-29 DIAGNOSIS — R578 Other shock: Secondary | ICD-10-CM | POA: Diagnosis not present

## 2011-11-29 DIAGNOSIS — I319 Disease of pericardium, unspecified: Secondary | ICD-10-CM

## 2011-11-29 DIAGNOSIS — IMO0002 Reserved for concepts with insufficient information to code with codable children: Secondary | ICD-10-CM

## 2011-11-29 DIAGNOSIS — N189 Chronic kidney disease, unspecified: Secondary | ICD-10-CM

## 2011-11-29 DIAGNOSIS — J811 Chronic pulmonary edema: Secondary | ICD-10-CM | POA: Diagnosis not present

## 2011-11-29 DIAGNOSIS — R188 Other ascites: Secondary | ICD-10-CM | POA: Diagnosis present

## 2011-11-29 DIAGNOSIS — E8770 Fluid overload, unspecified: Secondary | ICD-10-CM | POA: Diagnosis present

## 2011-11-29 LAB — RENAL FUNCTION PANEL
BUN: 44 mg/dL — ABNORMAL HIGH (ref 6–23)
BUN: 29 mg/dL — ABNORMAL HIGH (ref 6–23)
CO2: 21 mEq/L (ref 19–32)
CO2: 27 mEq/L (ref 19–32)
Calcium: 7.4 mg/dL — ABNORMAL LOW (ref 8.4–10.5)
Creatinine, Ser: 2.45 mg/dL — ABNORMAL HIGH (ref 0.50–1.10)
GFR calc Af Amer: 31 mL/min — ABNORMAL LOW (ref >90)
GFR calc Af Amer: 39 mL/min — ABNORMAL LOW (ref >90)
Glucose, Bld: 151 mg/dL — ABNORMAL HIGH (ref 70–99)
Glucose, Bld: 201 mg/dL — ABNORMAL HIGH (ref 70–99)
Phosphorus: 7.9 mg/dL — ABNORMAL HIGH (ref 2.3–4.6)
Potassium: 3.2 mEq/L — ABNORMAL LOW (ref 3.5–5.1)
Sodium: 144 mEq/L (ref 135–145)
Sodium: 140 mEq/L (ref 135–145)

## 2011-11-29 LAB — POCT I-STAT 3, ART BLOOD GAS (G3+)
Acid-base deficit: 5 mmol/L — ABNORMAL HIGH (ref 0.0–2.0)
Bicarbonate: 17.8 mEq/L — ABNORMAL LOW (ref 20.0–24.0)
Bicarbonate: 21.2 mEq/L (ref 20.0–24.0)
Bicarbonate: 24.5 mEq/L — ABNORMAL HIGH (ref 20.0–24.0)
O2 Saturation: 87 %
O2 Saturation: 95 %
Patient temperature: 94.7
Patient temperature: 95.1
TCO2: 26 mmol/L (ref 0–100)
pCO2 arterial: 42.1 mmHg (ref 35.0–45.0)
pH, Arterial: 7.397 (ref 7.350–7.450)
pO2, Arterial: 256 mmHg — ABNORMAL HIGH (ref 80.0–100.0)
pO2, Arterial: 51 mmHg — ABNORMAL LOW (ref 80.0–100.0)
pO2, Arterial: 63 mmHg — ABNORMAL LOW (ref 80.0–100.0)

## 2011-11-29 LAB — CBC
HCT: 34.4 % — ABNORMAL LOW (ref 36.0–46.0)
Hemoglobin: 12 g/dL (ref 12.0–15.0)
MCH: 29.1 pg (ref 26.0–34.0)
MCH: 29.1 pg (ref 26.0–34.0)
MCHC: 34.8 g/dL (ref 30.0–36.0)
MCHC: 34.9 g/dL (ref 30.0–36.0)
Platelets: 46 10*3/uL — ABNORMAL LOW (ref 150–400)
RBC: 4.26 MIL/uL (ref 3.87–5.11)
RDW: 14.1 % (ref 11.5–15.5)
RDW: 14.8 % (ref 11.5–15.5)
WBC: 1.9 10*3/uL — ABNORMAL LOW (ref 4.0–10.5)
WBC: 4.8 10*3/uL (ref 4.0–10.5)

## 2011-11-29 LAB — GLUCOSE, CAPILLARY
Glucose-Capillary: 191 mg/dL — ABNORMAL HIGH (ref 70–99)
Glucose-Capillary: 196 mg/dL — ABNORMAL HIGH (ref 70–99)
Glucose-Capillary: 207 mg/dL — ABNORMAL HIGH (ref 70–99)
Glucose-Capillary: 45 mg/dL — ABNORMAL LOW (ref 70–99)
Glucose-Capillary: 72 mg/dL (ref 70–99)
Glucose-Capillary: 78 mg/dL (ref 70–99)
Glucose-Capillary: 80 mg/dL (ref 70–99)

## 2011-11-29 LAB — COMPREHENSIVE METABOLIC PANEL
ALT: <5 U/L (ref 0–35)
Calcium: 6.4 mg/dL — CL (ref 8.4–10.5)
GFR calc Af Amer: 22 mL/min — ABNORMAL LOW (ref >90)
Glucose, Bld: 199 mg/dL — ABNORMAL HIGH (ref 70–99)
Sodium: 140 mEq/L (ref 135–145)
Total Protein: 2.7 g/dL — ABNORMAL LOW (ref 6.0–8.3)

## 2011-11-29 LAB — POCT I-STAT 3, VENOUS BLOOD GAS (G3P V)
Acid-base deficit: 4 mmol/L — ABNORMAL HIGH (ref 0.0–2.0)
pCO2, Ven: 44.8 mmHg — ABNORMAL LOW (ref 45.0–50.0)
pO2, Ven: 32 mmHg (ref 30.0–45.0)

## 2011-11-29 LAB — CORTISOL: Cortisol, Plasma: 8.6 ug/dL

## 2011-11-29 LAB — CBC WITH DIFFERENTIAL/PLATELET
Basophils Absolute: 0 10*3/uL (ref 0.0–0.1)
Lymphocytes Relative: 28 % (ref 12–46)
Lymphs Abs: 1.5 10*3/uL (ref 0.7–4.0)
Monocytes Relative: 7 % (ref 3–12)
Neutrophils Relative %: 64 % (ref 43–77)
Platelets: 25 10*3/uL — CL (ref 150–400)
RDW: 14.7 % (ref 11.5–15.5)
WBC: 5.5 10*3/uL (ref 4.0–10.5)

## 2011-11-29 LAB — PROCALCITONIN: Procalcitonin: 0.15 ng/mL

## 2011-11-29 LAB — PREPARE RBC (CROSSMATCH)

## 2011-11-29 LAB — HEPATITIS PANEL, ACUTE
Hep A IgM: NEGATIVE
Hep B C IgM: NEGATIVE

## 2011-11-29 LAB — PROTIME-INR
INR: 2.81 — ABNORMAL HIGH (ref 0.00–1.49)
INR: 1.58 — ABNORMAL HIGH (ref 0.00–1.49)
Prothrombin Time: 16.3 seconds — ABNORMAL HIGH (ref 11.6–15.2)

## 2011-11-29 LAB — HEPATIC FUNCTION PANEL
ALT: 15 U/L (ref 0–35)
AST: 56 U/L — ABNORMAL HIGH (ref 0–37)
Alkaline Phosphatase: 62 U/L (ref 39–117)
Bilirubin, Direct: 0.2 mg/dL (ref 0.0–0.3)

## 2011-11-29 LAB — LACTIC ACID, PLASMA: Lactic Acid, Venous: 2.2 mmol/L (ref 0.5–2.2)

## 2011-11-29 LAB — APTT
aPTT: 57 seconds — ABNORMAL HIGH (ref 24–37)
aPTT: 41 seconds — ABNORMAL HIGH (ref 24–37)

## 2011-11-29 LAB — TROPONIN I: Troponin I: <0.3 ng/mL (ref <0.30)

## 2011-11-29 MED ORDER — SODIUM CHLORIDE 0.9 % IV SOLN
100.0000 ug/h | INTRAVENOUS | Status: DC
  Administered 2011-11-29: 100 ug/h via INTRAVENOUS
  Administered 2011-11-30: 50 ug/h via INTRAVENOUS
  Filled 2011-11-29 (×2): qty 50

## 2011-11-29 MED ORDER — ACETAMINOPHEN 325 MG PO TABS
650.0000 mg | ORAL_TABLET | Freq: Four times a day (QID) | ORAL | Status: DC | PRN
  Administered 2011-12-02 – 2011-12-09 (×3): 650 mg
  Filled 2011-11-29 (×3): qty 2

## 2011-11-29 MED ORDER — DIPHENHYDRAMINE HCL 50 MG/ML IJ SOLN
25.0000 mg | Freq: Four times a day (QID) | INTRAMUSCULAR | Status: DC | PRN
  Administered 2011-11-29 – 2011-12-01 (×2): 25 mg via INTRAVENOUS
  Filled 2011-11-29 (×2): qty 1

## 2011-11-29 MED ORDER — DEXTROSE 50 % IV SOLN
INTRAVENOUS | Status: AC
  Administered 2011-11-29: 50 mL
  Filled 2011-11-29: qty 50

## 2011-11-29 MED ORDER — SODIUM CHLORIDE 0.9 % IV SOLN
2.0000 g | Freq: Once | INTRAVENOUS | Status: AC
  Administered 2011-11-29: 2 g via INTRAVENOUS
  Filled 2011-11-29 (×2): qty 20

## 2011-11-29 MED ORDER — SODIUM CHLORIDE 0.9 % IV SOLN
250.0000 mg | Freq: Two times a day (BID) | INTRAVENOUS | Status: DC
  Administered 2011-11-29 – 2011-12-01 (×5): 250 mg via INTRAVENOUS
  Filled 2011-11-29 (×7): qty 2.5

## 2011-11-29 MED ORDER — DEXTROSE 10 % IV SOLN
INTRAVENOUS | Status: DC
Start: 2011-11-29 — End: 2011-12-02
  Administered 2011-11-29 – 2011-12-02 (×4): via INTRAVENOUS

## 2011-11-29 MED ORDER — DOPAMINE-DEXTROSE 3.2-5 MG/ML-% IV SOLN
2.0000 ug/kg/min | INTRAVENOUS | Status: DC
  Administered 2011-11-29: 20 ug/kg/min via INTRAVENOUS

## 2011-11-29 MED ORDER — SODIUM CHLORIDE 0.9 % IV SOLN
250.0000 mg | Freq: Two times a day (BID) | INTRAVENOUS | Status: DC
  Filled 2011-11-29: qty 2.5

## 2011-11-29 MED ORDER — SODIUM CHLORIDE 0.9 % IV SOLN: INTRAVENOUS | Status: DC

## 2011-11-29 MED ORDER — MIDAZOLAM HCL 2 MG/2ML IJ SOLN
1.0000 mg | INTRAMUSCULAR | Status: DC | PRN
  Administered 2011-11-30 – 2011-12-01 (×4): 2 mg via INTRAVENOUS
  Filled 2011-11-29 (×4): qty 2

## 2011-11-29 MED ORDER — CHLORHEXIDINE GLUCONATE 0.12 % MT SOLN
15.0000 mL | Freq: Two times a day (BID) | OROMUCOSAL | Status: DC
  Administered 2011-11-29 – 2011-12-01 (×6): 15 mL via OROMUCOSAL
  Filled 2011-11-29 (×6): qty 15

## 2011-11-29 MED ORDER — SODIUM CHLORIDE 0.9 % IJ SOLN: 10.0000 mL | INTRAMUSCULAR | Status: DC | PRN

## 2011-11-29 MED ORDER — BIOTENE DRY MOUTH MT LIQD
15.0000 mL | Freq: Four times a day (QID) | OROMUCOSAL | Status: DC
  Administered 2011-11-29 – 2011-12-01 (×9): 15 mL via OROMUCOSAL

## 2011-11-29 NOTE — Progress Notes (Signed)
eLink Physician-Brief Progress Note Patient Name: Heather Sims DOB: Jun 19, 1990 MRN: [id number]  Date of Service  11/29/2011   HPI/Events of Note   Actively bleeding Severe shock  eICU Interventions  Massive transfusion protocol: 4 U PRBC 4 U FFP 4 U PLT 2 gm Ca Gluconate now Keep 4 U PRBC ahead Gen Surgery consulted      MCQUAID, DOUGLAS 11/29/2011, 12:04 AM

## 2011-11-29 NOTE — Progress Notes (Signed)
Pt profile: 21 yobf with PMH of cardiomyopathy, chronic renal insuff, seizure d/o, hypertension adm 9/5 by TRH with encephalopathy and dyspnea due to severe ascites. Underwent large volume paracentesis Dec 16, 2022, initially uncomplicated. Later developed severe intra-abd bleeding, profound anemia and hemorrhagic shock. Successfully resuscitated with IVFs and blood products. Intubated in course of resuscitation. CVVH initiated.  Active problems: Active Problems:  Renal failure (ARF), acute on chronic  Acute respiratory failure  Pulmonary edema  Hemorrhagic shock, due to intraabdominal bleeding following paracentesis  Hypervolemia  Ascites  Cardiomyopathy  Seizures disorder  Encephalopathy acute  Lines, Tubes, etc: R [place] Permcath 9/6 >>  L IJ CVL 12-16-22 >>  ETT 12/16/22 >>  R femoral A-line 12-16-22 >>   Microbiology: MRSA PCR 9/6 >> NEG Blood 9/5 >> ngtd 9/8 >> Peritoneal December 16, 2022 >> ngtd 9/8 >>  Blood December 16, 2022 >> ngtd 9/8 >>    Antibiotics:  Vanc Dec 16, 2022 >> 9/8 Zosyn 12-16-22 >> 9/8  Studies/Events: 9/6 Head CT: NAD December 16, 2022 US paracentesis >> 10 liters clear yellow fluid removed 2022/12/16 Hemorrhagic shock with intra-abd bleeding. Hgb down to 1.3 (?)  Consults:  CCS IR Renal   Best Practice: DVT: SCDs SUP: PPI Nutrition: none Glycemic control: n/i Sedation/analgesia: sedation protocol   Subj: RASS 0. + F/C. Intubated and on CVVH. Intermittent sedation  Obj: Filed Vitals:   11/29/11 1800  BP: 122/103  Pulse: 85  Temp: 96.9 F (36.1 C)  Resp: 23    Gen: acutely ill, intubated HEENT: NAD Neck: JVP not visualized Chest: coarse B BS, no wheezes Cardiac: RRR s M Abd: Markedly distended and firm, diminished BS, mildly tender diffusely Ext: symmetric edema  BMET    Component Value Date/Time   NA 140 11/29/2011 1610   K 3.2* 11/29/2011 1610   CL 104 11/29/2011 1610   CO2 27 11/29/2011 1610   GLUCOSE 201* 11/29/2011 1610   BUN 29* 11/29/2011 1610   CREATININE 2.02* 11/29/2011 1610   CREATININE 2.63* 10/12/2011  1230   CALCIUM 7.1* 11/29/2011 1610   CALCIUM 8.4 10/28/2011 1327   GFRNONAA 34* 11/29/2011 1610   GFRAA 39* 11/29/2011 1610    CBC    Component Value Date/Time   WBC 6.7 11/29/2011 1600   RBC 4.12 11/29/2011 1600   HGB 12.0 11/29/2011 1600   HCT 34.4* 11/29/2011 1600   PLT 36* 11/29/2011 1600   MCV 83.5 11/29/2011 1600   MCH 29.1 11/29/2011 1600   MCHC 34.9 11/29/2011 1600   RDW 14.8 11/29/2011 1600   LYMPHSABS 1.5 12-16-11 2230   MONOABS 0.4 12/16/2011 2230   EOSABS 0.1 Dec 16, 2011 2230   BASOSABS 0.0 Dec 16, 2011 2230    CXR: low volumes, CM, edema pattern   IMPRESSION: Active Problems:  Renal failure (ARF), acute on chronic  Acute respiratory failure  Pulmonary edema  Hemorrhagic shock, due to intraabdominal bleeding following paracentesis  Hypervolemia  Ascites  Cardiomyopathy  Seizures disorder  Encephalopathy acute No evidence of active infectious process    PLAN/RECS:  Discussed with Dr Arlean Hopping Discussed with Dr Archer Asa Vent changes made Begin volume removal with CVVH Dr Arlean Hopping would prefer to not perform CTA abd/pelvis unless appears to be actively bleeding - repeat CBC this PM D/C abx   45 mins CCM time   Billy Fischer, MD ; [facility] service Mobile [phone].  After 5:30 PM or weekends, call [phone]

## 2011-11-29 NOTE — Progress Notes (Signed)
eLink Physician-Brief Progress Note Patient Name: Heather Sims DOB: 1990-06-27 MRN: [id number]  Date of Service  11/29/2011   HPI/Events of Note   Hemorrhagic shock continues Gen surgery recommends no surgery given coagulopathy, instability   eICU Interventions  Have discussed with IR physician who performed the procedure. At this point she is too unstable for travel to IR suite for diagnostic or therapeutic angiogram; will re-assess after aggressive transfusion.      Jsean Taussig 11/29/2011, 12:46 AM

## 2011-11-29 NOTE — Progress Notes (Signed)
eLink Physician-Brief Progress Note Patient Name: Heather Sims DOB: 03-31-1990 MRN: [id number]  Date of Service  11/29/2011   HPI/Events of Note   Worsening hypoxemia  eICU Interventions  Volume removal as tolerated by HD ARDS protocol   Intervention Category Major Interventions: Hypoxemia - evaluation and management  Chaitanya Amedee 11/29/2011, 4:38 AM

## 2011-11-29 NOTE — Progress Notes (Signed)
Pt.'s CBG was 27 at 0730. I gave 1 amp of D50 and rechecked CBG in 15 min and it was 45. I gave a 2nd amp of D50, and it came up to 72.

## 2011-11-29 NOTE — Progress Notes (Signed)
Agree with above.  No acute surgical indications.  Wilmon Arms. Corliss Skains, MD, [facility]  11/29/2011 9:48 AM

## 2011-11-29 NOTE — Progress Notes (Signed)
Pt. Blood sugar was 61 at 1519. I gave 1 amp D50. The blood sugar came up to 196.

## 2011-11-29 NOTE — Procedures (Signed)
Intubation Procedure Note KESHONDA MONSOUR [id number] 17-Apr-1990  Procedure: Intubation Indications: Airway protection and maintenance  Procedure Details Consent: Unable to obtain consent because of emergent medical necessity. Time Out: Verified patient identification, verified procedure, site/side was marked, verified correct patient position, special equipment/implants available, medications/allergies/relevent history reviewed, required imaging and test results available.  Performed  Maximum sterile technique was used including gloves and mask.  MAC and 3    Evaluation Hemodynamic Status: Persistent hypotension treated with pressors; O2 sats: stable throughout Patient's Current Condition: unstable Complications: Complications of failed attempt with Mac 3 blade.  Glide scope used with correct placement confirmed. Patient did tolerate procedure well. Chest X-ray ordered to verify placement.  CXR: tube position low-repostitioned.   Aleem Elza 11/29/2011

## 2011-11-29 NOTE — Consult Note (Addendum)
Reason for Consult:bleeding Referring Physician: Dr. Antony Haste Heather Sims is an 21 y.o. female.  HPI: 30 yof with multiple medical problems as listed who underwent placement of right ij dialysis catheter today and a 10 L paracentesis.  There is not a report available for this but a note in the chart that doesn't appear this was complicated.  Post procedure she became hypotensive.  Heather was called after she is being resuscitated in the ICU with a hct of 4 to evaluate for surgical solution.  Past Medical History  Diagnosis Date  . Pneumonia     'walking pneumonia'  . Seizures   . Anemia   . Heart attack   . Cardiomyopathy   . Heart failure   . CHF (congestive heart failure)   . Ileitis   . Chronic kidney disease   . Renal disorder   . MPGN (membranoproliferative glomerulonephritis), type 2   . Hypertension   . Brain injury     Past Surgical History  Procedure Date  . Renal biopsy     Family History  Problem Relation Age of Onset  . Hypertension Mother   . Diabetes Father     Social History:  reports that she has never smoked. She has never used smokeless tobacco. She reports that she does not drink alcohol or use illicit drugs.  Allergies: No Known Allergies  meds reviewed  Results for orders placed during the hospital encounter of 11/26/11 (from the past 48 hour(s))  MRSA PCR SCREENING     Status: Normal   Collection Time   11/27/11  1:56 AM      Component Value Range Comment   MRSA by PCR NEGATIVE  NEGATIVE   GLUCOSE, CAPILLARY     Status: Normal   Collection Time   11/27/11  2:08 AM      Component Value Range Comment   Glucose-Capillary 80  70 - 99 mg/dL    Comment 1 Documented in Chart      Comment 2 Notify RN     PATHOLOGIST SMEAR REVIEW     Status: Normal   Collection Time   11/27/11  2:49 AM      Component Value Range Comment   Path Review SMEAR STAINED AND AVAILABLE FOR REVIEW     CBC WITH DIFFERENTIAL     Status: Abnormal   Collection Time   11/27/11   2:49 AM      Component Value Range Comment   WBC 6.2  4.0 - 10.5 K/uL    RBC 2.75 (*) 3.87 - 5.11 MIL/uL    Hemoglobin 8.9 (*) 12.0 - 15.0 g/dL    HCT 13.0 (*) 86.5 - 46.0 %    MCV 103.3 (*) 78.0 - 100.0 fL    MCH 32.4  26.0 - 34.0 pg    MCHC 31.3  30.0 - 36.0 g/dL    RDW 78.4  69.6 - 29.5 %    Platelets 81 (*) 150 - 400 K/uL CONSISTENT WITH PREVIOUS RESULT   Neutrophils Relative 61  43 - 77 %    Neutro Abs 3.8  1.7 - 7.7 K/uL    Lymphocytes Relative 25  12 - 46 %    Lymphs Abs 1.6  0.7 - 4.0 K/uL    Monocytes Relative 12  3 - 12 %    Monocytes Absolute 0.7  0.1 - 1.0 K/uL    Eosinophils Relative 3  0 - 5 %    Eosinophils Absolute 0.2  0.0 -  0.7 K/uL    Basophils Relative 0  0 - 1 %    Basophils Absolute 0.0  0.0 - 0.1 K/uL   COMPREHENSIVE METABOLIC PANEL     Status: Abnormal   Collection Time   11/27/11  3:30 AM      Component Value Range Comment   Sodium 143  135 - 145 mEq/L    Potassium 3.1 (*) 3.5 - 5.1 mEq/L    Chloride 96  96 - 112 mEq/L    CO2 33 (*) 19 - 32 mEq/L    Glucose, Bld 76  70 - 99 mg/dL    BUN 59 (*) 6 - 23 mg/dL    Creatinine, Ser 6.96 (*) 0.50 - 1.10 mg/dL    Calcium 8.5  8.4 - 29.5 mg/dL    Total Protein 6.3  6.0 - 8.3 g/dL    Albumin 1.4 (*) 3.5 - 5.2 g/dL    AST 24  0 - 37 U/L    ALT <5  0 - 35 U/L    Alkaline Phosphatase 38 (*) 39 - 117 U/L    Total Bilirubin 0.1 (*) 0.3 - 1.2 mg/dL    GFR calc non Af Amer 17 (*) >90 mL/min    GFR calc Af Amer 19 (*) >90 mL/min   VALPROIC ACID LEVEL     Status: Normal   Collection Time   11/27/11  3:30 AM      Component Value Range Comment   Valproic Acid Lvl 89.7  50.0 - 100.0 ug/mL   GLUCOSE, CAPILLARY     Status: Normal   Collection Time   11/27/11  4:04 AM      Component Value Range Comment   Glucose-Capillary 92  70 - 99 mg/dL   GLUCOSE, CAPILLARY     Status: Normal   Collection Time   11/27/11  7:34 AM      Component Value Range Comment   Glucose-Capillary 85  70 - 99 mg/dL    Comment 1 Notify RN       Comment 2 Documented in Chart     GLUCOSE, CAPILLARY     Status: Normal   Collection Time   11/27/11 10:47 AM      Component Value Range Comment   Glucose-Capillary 99  70 - 99 mg/dL    Comment 1 Notify RN      Comment 2 Documented in Chart     VITAMIN B12     Status: Abnormal   Collection Time   11/28/11  4:00 AM      Component Value Range Comment   Vitamin B-12 1354 (*) 211 - 911 pg/mL   FOLATE     Status: Normal   Collection Time   11/28/11  4:00 AM      Component Value Range Comment   Folate 4.1     CBC     Status: Abnormal   Collection Time   11/28/11  4:00 AM      Component Value Range Comment   WBC 6.6  4.0 - 10.5 K/uL    RBC 2.82 (*) 3.87 - 5.11 MIL/uL    Hemoglobin 9.0 (*) 12.0 - 15.0 g/dL    HCT 28.4 (*) 13.2 - 46.0 %    MCV 102.8 (*) 78.0 - 100.0 fL    MCH 31.9  26.0 - 34.0 pg    MCHC 31.0  30.0 - 36.0 g/dL    RDW 44.0  10.2 - 72.5 %    Platelets 69 (*)  150 - 400 K/uL CONSISTENT WITH PREVIOUS RESULT  PROTIME-INR     Status: Abnormal   Collection Time   11/28/11  4:00 AM      Component Value Range Comment   Prothrombin Time 15.8 (*) 11.6 - 15.2 seconds    INR 1.23  0.00 - 1.49   APTT     Status: Abnormal   Collection Time   11/28/11  4:00 AM      Component Value Range Comment   aPTT 40 (*) 24 - 37 seconds   RENAL FUNCTION PANEL     Status: Abnormal   Collection Time   11/28/11  4:15 AM      Component Value Range Comment   Sodium 140  135 - 145 mEq/L    Potassium 3.9  3.5 - 5.1 mEq/L DELTA CHECK NOTED   Chloride 94 (*) 96 - 112 mEq/L    CO2 32  19 - 32 mEq/L    Glucose, Bld 86  70 - 99 mg/dL    BUN 60 (*) 6 - 23 mg/dL    Creatinine, Ser 0.98 (*) 0.50 - 1.10 mg/dL    Calcium 8.5  8.4 - 11.9 mg/dL    Phosphorus 6.4 (*) 2.3 - 4.6 mg/dL    Albumin 1.4 (*) 3.5 - 5.2 g/dL    GFR calc non Af Amer 17 (*) >90 mL/min    GFR calc Af Amer 20 (*) >90 mL/min   ALBUMIN, FLUID     Status: Normal   Collection Time   11/28/11  4:55 PM      Component Value Range Comment   Albumin,  Fluid 1.1   NO NORMAL RANGE ESTABLISHED FOR THIS TEST   Fluid Type-FALB PERITONEAL   CORRECTED ON 09/07 AT 1703: PREVIOUSLY REPORTED AS Body Fluid  BODY FLUID CELL COUNT WITH DIFFERENTIAL     Status: Abnormal   Collection Time   11/28/11  4:55 PM      Component Value Range Comment   Fluid Type-FCT PERITONEAL   CORRECTED ON 09/07 AT 1704: PREVIOUSLY REPORTED AS Body Fluid   Color, Fluid YELLOW      Appearance, Fluid CLEAR  CLEAR    WBC, Fluid 102  0 - 1000 cu mm    Neutrophil Count, Fluid 0  0 - 25 %    Lymphs, Fluid 8      Monocyte-Macrophage-Serous Fluid 91 (*) 50 - 90 %    Eos, Fluid 1     LACTATE DEHYDROGENASE, BODY FLUID     Status: Abnormal   Collection Time   11/28/11  4:55 PM      Component Value Range Comment   LD, Fluid 138 (*) 3 - 23 U/L    Fluid Type-FLDH PERITONEAL   CORRECTED ON 09/07 AT 1704: PREVIOUSLY REPORTED AS Body Fluid  GLUCOSE, PERITONEAL FLUID     Status: Normal   Collection Time   11/28/11  4:55 PM      Component Value Range Comment   Glucose, Peritoneal Fluid 92   NO NORMAL RANGE ESTABLISHED FOR THIS TEST  PROTEIN, BODY FLUID     Status: Normal   Collection Time   11/28/11  4:55 PM      Component Value Range Comment   Total protein, fluid 3.4   NO NORMAL RANGE ESTABLISHED FOR THIS TEST   Fluid Type-FTP PERITONEAL     GLUCOSE, CAPILLARY     Status: Abnormal   Collection Time   11/28/11  7:02 PM  Component Value Range Comment   Glucose-Capillary 127 (*) 70 - 99 mg/dL   LACTIC ACID, PLASMA     Status: Abnormal   Collection Time   11/28/11  8:50 PM      Component Value Range Comment   Lactic Acid, Venous 10.5 (*) 0.5 - 2.2 mmol/L   TROPONIN Heather     Status: Normal   Collection Time   11/28/11  8:51 PM      Component Value Range Comment   Troponin Heather <0.30  <0.30 ng/mL   CK TOTAL AND CKMB     Status: Normal   Collection Time   11/28/11  8:51 PM      Component Value Range Comment   Total CK 54  7 - 177 U/L    CK, MB 1.9  0.3 - 4.0 ng/mL    Relative Index  RELATIVE INDEX IS INVALID  0.0 - 2.5   CBC WITH DIFFERENTIAL     Status: Abnormal (Preliminary result)   Collection Time   11/28/11 10:30 PM      Component Value Range Comment   WBC 5.5  4.0 - 10.5 K/uL    RBC <1.00 (*) 3.87 - 5.11 MIL/uL    Hemoglobin 1.3 (*) 12.0 - 15.0 g/dL    HCT 4.3 (*) 16.1 - 09.6 %    MCV 102.4 (*) 78.0 - 100.0 fL    MCH 31.0  26.0 - 34.0 pg    MCHC 30.2  30.0 - 36.0 g/dL    RDW 04.5  40.9 - 81.1 %    Platelets PENDING  150 - 400 K/uL    Neutrophils Relative PENDING  43 - 77 %    Neutro Abs PENDING  1.7 - 7.7 K/uL    Band Neutrophils PENDING  0 - 10 %    Lymphocytes Relative PENDING  12 - 46 %    Lymphs Abs PENDING  0.7 - 4.0 K/uL    Monocytes Relative PENDING  3 - 12 %    Monocytes Absolute PENDING  0.1 - 1.0 K/uL    Eosinophils Relative PENDING  0 - 5 %    Eosinophils Absolute PENDING  0.0 - 0.7 K/uL    Basophils Relative PENDING  0 - 1 %    Basophils Absolute PENDING  0.0 - 0.1 K/uL    WBC Morphology PENDING      RBC Morphology PENDING      Smear Review PENDING      nRBC PENDING  0 /100 WBC    Metamyelocytes Relative PENDING      Myelocytes PENDING      Promyelocytes Absolute PENDING      Blasts PENDING     COMPREHENSIVE METABOLIC PANEL     Status: Abnormal (Preliminary result)   Collection Time   11/28/11 10:30 PM      Component Value Range Comment   Sodium 140  135 - 145 mEq/L    Potassium 4.4  3.5 - 5.1 mEq/L    Chloride 101  96 - 112 mEq/L    CO2 21  19 - 32 mEq/L    Glucose, Bld 199 (*) 70 - 99 mg/dL    BUN 52 (*) 6 - 23 mg/dL    Creatinine, Ser 9.14 (*) 0.50 - 1.10 mg/dL    Calcium 6.4 (*) 8.4 - 10.5 mg/dL    Total Protein 2.7 (*) 6.0 - 8.3 g/dL    Albumin 1.1 (*) 3.5 - 5.2 g/dL    AST 13  0 - 37 U/L    ALT PENDING  0 - 35 U/L    Alkaline Phosphatase 17 (*) 39 - 117 U/L    Total Bilirubin 0.1 (*) 0.3 - 1.2 mg/dL    GFR calc non Af Amer 19 (*) >90 mL/min    GFR calc Af Amer 22 (*) >90 mL/min   TYPE AND SCREEN     Status: Normal  (Preliminary result)   Collection Time   11/28/11 10:30 PM      Component Value Range Comment   ABO/RH(D) O POS      Antibody Screen NEG      Sample Expiration 12/01/2011      Unit Number [id number]      Blood Component Type RED CELLS,LR      Unit division 00      Status of Unit ISSUED      Transfusion Status OK TO TRANSFUSE      Crossmatch Result Compatible      Unit Number [id number]      Blood Component Type RBC LR PHER2      Unit division 00      Status of Unit ISSUED      Transfusion Status OK TO TRANSFUSE      Crossmatch Result Compatible      Unit Number [id number]      Blood Component Type RED CELLS,LR      Unit division 00      Status of Unit ISSUED      Transfusion Status OK TO TRANSFUSE      Crossmatch Result Compatible      Unit Number [id number]      Blood Component Type RED CELLS,LR      Unit division 00      Status of Unit ISSUED      Transfusion Status OK TO TRANSFUSE      Crossmatch Result Compatible      Unit Number [id number]      Blood Component Type RED CELLS,LR      Unit division 00      Status of Unit ISSUED      Transfusion Status OK TO TRANSFUSE      Crossmatch Result Compatible      Unit Number [id number]      Blood Component Type RED CELLS,LR      Unit division 00      Status of Unit ALLOCATED      Transfusion Status OK TO TRANSFUSE      Crossmatch Result Compatible      Unit Number [id number]      Blood Component Type RED CELLS,LR      Unit division 00      Status of Unit ALLOCATED      Transfusion Status OK TO TRANSFUSE      Crossmatch Result Compatible     PREPARE RBC (CROSSMATCH)     Status: Normal   Collection Time   11/28/11 11:30 PM      Component Value Range Comment   Order Confirmation ORDER PROCESSED BY BLOOD BANK     PREPARE FRESH FROZEN PLASMA     Status: Normal (Preliminary result)   Collection Time   11/29/11 12:02 AM      Component Value Range Comment   Unit Number [id number]      Blood Component  Type THAWED PLASMA      Unit division 00      Status of Unit ISSUED      Transfusion Status OK TO  TRANSFUSE      Unit tag comment VERBAL ORDERS PER DR [facility]      Unit Number [id number]      Blood Component Type THAWED PLASMA      Unit division 00      Status of Unit ISSUED      Transfusion Status OK TO TRANSFUSE      Unit tag comment VERBAL ORDERS PER DR MCCLUNG     PREPARE PLATELET PHERESIS     Status: Normal (Preliminary result)   Collection Time   11/29/11 12:02 AM      Component Value Range Comment   Unit Number [id number]      Blood Component Type PLTPHER LR1      Unit division 00      Status of Unit ISSUED      Transfusion Status OK TO TRANSFUSE     POCT Heather-STAT 3, BLOOD GAS (G3+)     Status: Abnormal   Collection Time   11/29/11 12:08 AM      Component Value Range Comment   pH, Arterial 7.397  7.350 - 7.450    pCO2 arterial 27.9 (*) 35.0 - 45.0 mmHg    pO2, Arterial 256.0 (*) 80.0 - 100.0 mmHg    Bicarbonate 17.8 (*) 20.0 - 24.0 mEq/L    TCO2 19  0 - 100 mmol/L    O2 Saturation 100.0      Acid-base deficit 7.0 (*) 0.0 - 2.0 mmol/L    Patient temperature 92.3 F      Collection site RADIAL, ALLEN'S TEST ACCEPTABLE      Drawn by Operator      Sample type ARTERIAL       Dg Chest Portable 1 View  11/29/2011  *RADIOLOGY REPORT*  Clinical Data:  PORTABLE CHEST - 1 VIEW  Comparison: 11/28/2011 at 2210 hours  Findings: Endotracheal tube tip measures 1.4 cm above the carina. Enteric tube tip is not visualized but is below the left hemidiaphragm consistent with location at least in the stomach. There appear to be two left central venous catheters, one via internal jugular and one via subclavian approach.  The catheter tips are both located across the midline in the right axillary vein region.  No pneumothorax.  Cardiac enlargement with prominent pulmonary vascularity.  Perihilar airspace disease and air bronchograms may represent edema or pneumonia.  IMPRESSION: Left central  venous catheter tips are projected over the right axillary vein and should be pulled back.  Otherwise stable appearance of the chest.   Original Report Authenticated By: Marlon Pel, M.D.    Dg Chest Port 1 View  11/28/2011  *RADIOLOGY REPORT*  Clinical Data: Repositioning of endotracheal tube. Ventilator dependent respiratory failure.  Acute renal failure.  PORTABLE CHEST - 1 VIEW  Comparison: Prior today  Findings: The endotracheal tube has now been pulled back with the tip approximately 2 cm above the trachea.  Right jugular dual lumen center venous dialysis catheter and nasogastric remain in appropriate position.  Worsening consolidation or collapse is seen involving the retrocardiac left lung.  Right basilar atelectasis is stable.  IMPRESSION:  1.  Endotracheal tube tip now approximately 2 cm above carina. 2.  Increased left retrocardiac consolidation versus collapse. 3.  Stable mild right basilar atelectasis.   Original Report Authenticated By: Danae Orleans, M.D.    Dg Chest Port 1 View  11/28/2011  *RADIOLOGY REPORT*  Clinical Data: Status post endotracheal tube placement  PORTABLE CHEST - 1 VIEW  Comparison: 11/27/2011  Findings: There is been interval placement of an endotracheal tube. It is directed into the right mainstem bronchus and should be withdrawn 1-2 cm. Decreased aeration of the left lung base is again noted.  A nasogastric catheter is noted within the stomach.  A dialysis catheter is seen and stable.  IMPRESSION: Persistent changes in the left lung base.  Placement of endotracheal tube in to the right mainstem bronchus. This should be withdrawn 1-2 cm.  Nasogastric catheter the stomach.   Original Report Authenticated By: Phillips Odor, M.D.    Dg Chest Port 1 View  11/27/2011  *RADIOLOGY REPORT*  Clinical Data: Follow up infiltrate  PORTABLE CHEST - 1 VIEW  Comparison: 11/26/2011  Findings: Low lung volumes.  Patchy left mid/lower lobe opacities, unchanged.  The heart is top  norma in size for inspiration.  No pneumothorax is seen.  IMPRESSION: Patchy left mid/lower lobe opacities, unchanged.   Original Report Authenticated By: Charline Bills, M.D.     ROS Blood pressure 108/69, pulse 75, temperature 92.1 F (33.4 C), temperature source Rectal, resp. rate 24, height 5\' 2"  (1.575 m), weight 157 lb 3 oz (71.3 kg), last menstrual period 10/08/2011, SpO2 100.00%. Physical Exam  Vitals reviewed. Constitutional: She is intubated (coarse bilateral breath sounds).  Cardiovascular: Tachycardia present.   Respiratory: She is intubated (coarse bilateral breath sounds).    GI: She exhibits distension (she is distended ). There is Tenderness: unable to assess due to mental status, intubated..  She does not have any ecchymosis around her paracentesis site or bleeding from site, I do not see flank ecchymosis or any real ecchymosis or findings that would make it likely this was a rectus sheath hematoma from an inferior epigastric artery laceration  Assessment/Plan: Hemorrhage, recent large volume paracentesis, catheter placement  She has bled to a hct of 4 from 29.  No obvious source of bleeding on exam except possibly her abdomen given procedure and her exam.  The tap appearance was yellow and didn't appear bloody according to notes.  Certainly you can get hypotensive from the paracentesis but she clearly has lost her nearly her entire blood volume. Her temperature is currently 88 degrees.  I do think she has bled ? Retroperitoneal or intra-abdominal.  This was from paracentesis needle so you would think this would stop on own.  There is not reliable way to figure this out and she cannot get imaged and u/s is going to show fluid.  Heather don't think there is really a surgical solution to this right now either.  Heather can do laparotomy and pack her but Heather think with her profound hypothermia and blood loss resuscitation for now is what she needs.  Heather don't think she will tolerate surgery and Heather  don't know that it will even help quite honestly right now for what she likely has.  Will follow with you.  Heather discussed this with ccm. Would strongly consider some imaging if she is able when resuscitated.  She does not have liver failure or abnl protime prior to this it appears so has no real reason to have mesenteric varices.  If she has inferior epigastric artery injury (although the puncture site looks well lateral) and rectus hematoma this is usually nonoperative.  If this has ruptured into space around bladder then hopefully would still tamponade and stop but therapy if she is going to undergo surgery would really be based on what the source is.  Heather think proceeding again to laparotomy right now would  likely resulting in her death given the hypothermia and rest of her current condition right now and her best chance for survival is resuscitation and then attempt to figure out source.  Micaiah Litle 11/29/2011, 12:20 AM

## 2011-11-29 NOTE — Procedures (Signed)
Central Venous Catheter Insertion Procedure Note Heather Sims [id number] 01-24-1991  Procedure: Insertion of Central Venous Catheter Indications: Drug and/or fluid administration  Procedure Details Consent: Risks of procedure as well as the alternatives and risks of each were explained to the (patient/caregiver).  Consent for procedure obtained. Time Out: Verified patient identification, verified procedure, site/side was marked, verified correct patient position, special equipment/implants available, medications/allergies/relevent history reviewed, required imaging and test results available.  Performed  Maximum sterile technique was used including antiseptics, cap, gloves, gown, hand hygiene and mask. Skin prep: Chlorhexidine; local anesthetic administered A antimicrobial bonded/coated triple lumen catheter was placed in the left subclavian vein using the Seldinger technique.  Evaluation Blood flow good Complications: Complications of catheter placed to right axillary vein. Patient did tolerate procedure well. Chest X-ray ordered to verify placement.  CXR: right axillary vein.Marland Kitchen  Heather Sims 11/29/2011, 3:18 AM

## 2011-11-29 NOTE — Progress Notes (Signed)
Echocardiogram 2D Echocardiogram has been performed.  Heather Sims 11/29/2011, 11:07 AM

## 2011-11-29 NOTE — Progress Notes (Signed)
INTERVENTIONAL RADIOLOGY PROGRESS NOTE   HPI: Heather Sims is an 21 y.o. female with a complex medical history including C3 nephropathy, anoxic brain injury, seizure, cardiomyopathy, hypertension and acute on chronic renal failure with large volume ascites.  She was treated in interventional radiology yesterday afternoon - a right IJ permacatheter was placed and she underwent an US guided large volume paracentesis. 10L of clear yellow ascites was aspirated aver a period of about an hour with no evidence of bleeding complication at the time of the procedure.  However, upon returning to the ICU, she developed hypotension and was ultimately found to have a massive intraperitoneal vs retroperitoneal hemorrhage and hypotensive shock.  She required massive volume and blood product resuscitation and pressor support.  I discussed her care early this morning with the on-call ICU physician and 0100 and again at 0330.  She responded well to resuscitation and this morning had recovered her Hct and no longer required pressor support. She does remain intubated.  I discussed the procedure and unexpected complication with her mother this morning.    Past Medical History:  Past Medical History  Diagnosis Date  . Pneumonia     'walking pneumonia'  . Seizures   . Anemia   . Heart attack   . Cardiomyopathy   . Heart failure   . CHF (congestive heart failure)   . Ileitis   . Chronic kidney disease   . Renal disorder   . MPGN (membranoproliferative glomerulonephritis), type 2   . Hypertension   . Brain injury     Surgical History:  Past Surgical History  Procedure Date  . Renal biopsy     Family History:  Family History  Problem Relation Age of Onset  . Hypertension Mother   . Diabetes Father     Social History:  reports that she has never smoked. She has never used smokeless tobacco. She reports that she does not drink alcohol or use illicit drugs.  Allergies: No Known  Allergies  Medications: I have reviewed the patient's current medications.  ROS: See HPI for pertinent findings, otherwise complete 10 system review negative.  Physical Exam: Blood pressure 102/75, pulse 91, temperature 96.6 F (35.9 C), temperature source Core (Comment), resp. rate 20, height 5\' 2"  (1.575 m), weight 167 lb 8.8 oz (76 kg), last menstrual period 10/08/2011, SpO2 99.00%.  Abdomen: Distended and tense.  This is an acute change from the completely decompressed abdomen that I saw at the end of her paracentesis procedure.  Her exam is most consistent with an intraperitoneal bleed.  No blood staining the bandage site. Neck: Right IJ permacatheter, bandage clean and dry.   Labs: CBC  Basename 11/29/11 0224 11/28/11 2230  WBC 1.9* 5.5  HGB 14.6 1.3*  HCT 41.8 4.3*  PLT 46* 25*   MET  Basename 11/29/11 0224 11/28/11 2230  NA 144 140  K 3.6 4.4  CL 100 101  CO2 21 21  GLUCOSE 151* 199*  BUN 44* 52*  CREATININE 2.45* 3.32*  CALCIUM 7.4* 6.4*    Basename 11/29/11 0224  PROT 6.1  ALBUMIN 2.8*2.7*  AST 56*  ALT 15  ALKPHOS 62  BILITOT 0.4  BILIDIR 0.2  IBILI 0.2*  LIPASE --   PT/INR  Basename 11/29/11 0250 11/28/11 2230  LABPROT 16.3* 30.0*  INR 1.29 2.81*   ABG  Basename 11/29/11 0656 11/29/11 0406  PHART 7.458* 7.353  HCO3 24.5* 24.0      Dg Chest Port 1 View  11/29/2011  *  RADIOLOGY REPORT*  Clinical Data: CVP placement.  PORTABLE CHEST - 1 VIEW  Comparison: 11/28/2011  Findings: Since the previous study, the left subclavian catheter has been removed.  The left internal jugular catheter has been pulled back.  The tip is now projected over the mid mediastinum consistent with location in the confluence of the brachycephalic vein and superior vena cava.  Right central venous catheter, endotracheal tube, and enteric tubes are unchanged in position. Again, there is shallow inspiration with cardiac enlargement and pulmonary vascular congestion.   Atelectasis or consolidation in the lung bases.  IMPRESSION: Left internal jugular venous catheter tip is projected over the confluence of the brachial cephalic vein and SVC.  Appliances are otherwise stable.  Persistent cardiac enlargement with pulmonary vascular congestion.  Basilar atelectasis or consolidation.   Original Report Authenticated By: Marlon Pel, M.D.    Dg Chest Portable 1 View  11/29/2011  *RADIOLOGY REPORT*  Clinical Data:  PORTABLE CHEST - 1 VIEW  Comparison: 11/28/2011 at 2210 hours  Findings: Endotracheal tube tip measures 1.4 cm above the carina. Enteric tube tip is not visualized but is below the left hemidiaphragm consistent with location at least in the stomach. There appear to be two left central venous catheters, one via internal jugular and one via subclavian approach.  The catheter tips are both located across the midline in the right axillary vein region.  No pneumothorax.  Cardiac enlargement with prominent pulmonary vascularity.  Perihilar airspace disease and air bronchograms may represent edema or pneumonia.  IMPRESSION: Left central venous catheter tips are projected over the right axillary vein and should be pulled back.  Otherwise stable appearance of the chest.   Original Report Authenticated By: Marlon Pel, M.D.    Dg Chest Port 1 View  11/28/2011  *RADIOLOGY REPORT*  Clinical Data: Repositioning of endotracheal tube. Ventilator dependent respiratory failure.  Acute renal failure.  PORTABLE CHEST - 1 VIEW  Comparison: Prior today  Findings: The endotracheal tube has now been pulled back with the tip approximately 2 cm above the trachea.  Right jugular dual lumen center venous dialysis catheter and nasogastric remain in appropriate position.  Worsening consolidation or collapse is seen involving the retrocardiac left lung.  Right basilar atelectasis is stable.  IMPRESSION:  1.  Endotracheal tube tip now approximately 2 cm above carina. 2.  Increased left  retrocardiac consolidation versus collapse. 3.  Stable mild right basilar atelectasis.   Original Report Authenticated By: Danae Orleans, M.D.    Dg Chest Port 1 View  11/28/2011  *RADIOLOGY REPORT*  Clinical Data: Status post endotracheal tube placement  PORTABLE CHEST - 1 VIEW  Comparison: 11/27/2011  Findings: There is been interval placement of an endotracheal tube. It is directed into the right mainstem bronchus and should be withdrawn 1-2 cm. Decreased aeration of the left lung base is again noted.  A nasogastric catheter is noted within the stomach.  A dialysis catheter is seen and stable.  IMPRESSION: Persistent changes in the left lung base.  Placement of endotracheal tube in to the right mainstem bronchus. This should be withdrawn 1-2 cm.  Nasogastric catheter the stomach.   Original Report Authenticated By: Phillips Odor, M.D.    Dg Chest Port 1 View  11/27/2011  *RADIOLOGY REPORT*  Clinical Data: Follow up infiltrate  PORTABLE CHEST - 1 VIEW  Comparison: 11/26/2011  Findings: Low lung volumes.  Patchy left mid/lower lobe opacities, unchanged.  The heart is top norma in size for inspiration.  No pneumothorax is seen.  IMPRESSION: Patchy left mid/lower lobe opacities, unchanged.   Original Report Authenticated By: Charline Bills, M.D.     Assessment/Plan: 21 yo female with multiple medical problems and recent massive intraperitoneal hemorrhage and hypotensive shock.  She has stabilized overnight and while still intubated, is no longer requiring pressor support.   I am uncertain as to the etiology of her hemorrhage, but it seems certain that it is related to her paracentesis. The puncture was performed under sonographic guidance and the needle exchanged for a blunt 28F drainage catheter.  I saw no signs of bleeding during an hour drainage of 10L of clear yellow fluid and so I think an arterial source is very unlikely.  I suspect a venous injury, perhaps even a venous varix either from the  presence of the drainage tube, or perhaps related to the large change in her abdominal contour during the removal of the fluid.  Additionally, she was quite uremic and mildly thrombocytopenic at the time of the procedure.  It's possible that she had uremia induced platelet dysfunction and bled into a recently decompressed space with no ability to tamponade.   Regardless, her abdomen is now again distended with blood and appears to have tamponaded the bleeding.  - Repeat CBC to assess for any continued bleeding - When stable enough to travel to CT, recommend CTA abdomen/pelvis including delayed imaging to assess for bleeding source (arterial injury, pseudoaneurysm, venous abnormalities) - If hemorrhage recurs or continues, we can proceed with angiography in an attempt to identify a treatable arterial source - Continue excellent critical care and supportive measures - Appreciate surgical consultation     Signed,  Sterling Big, MD Vascular & Interventional Radiologist [facility]

## 2011-11-29 NOTE — Progress Notes (Signed)
CRITICAL VALUE ALERT  Critical value received: Platelet 25  Date of notification:  11/29/11  Time of notification:  0035  Critical value read back:yes  Nurse who received alert:  Leonia Reeves   MD notified (1st page):  Dr. Kendrick Fries  Time of first page: 0040  MD notified (2nd page):  Time of second page:  Responding MD:  Dr. Kendrick Fries  Time MD responded:  [phone]

## 2011-11-29 NOTE — Progress Notes (Signed)
Pt.s blood sugar was 27 at 1126; I gave 1 amp D50 and it was 80 after the recheck. I notified Dr. Sung Amabile and he wrote orders for D10 at 50. Will continue to monitor

## 2011-11-29 NOTE — Progress Notes (Signed)
[facility] ROUNDING NOTE   Subjective:   Interval History:events noted from last night. Hemodynamic instability following paracentesis. Now with massive volume resuscitation. Patient hypothermic and now on bear hugger warmer. Blood pressure improved and off pressors.  Objective:  Vital signs in last 24 hours:  Temp:  [88.6 F (31.4 C)-98.7 F (37.1 C)] 95.4 F (35.2 C) (09/08 0700) Pulse Rate:  [62-139] 81  (09/08 0645) Resp:  [0-26] 24  (09/08 0700) BP: (51-164)/(22-110) 98/72 mmHg (09/08 0700) SpO2:  [84 %-100 %] 100 % (09/08 0700) Arterial Line BP: (94-164)/(60-115) 105/71 mmHg (09/08 0700) FiO2 (%):  [60 %-100 %] 90 % (09/08 0700) Weight:  [64.6 kg (142 lb 6.7 oz)-76 kg (167 lb 8.8 oz)] 76 kg (167 lb 8.8 oz) (09/08 0600)  Weight change: -6.7 kg (-14 lb 12.3 oz) Filed Weights   11/28/11 0400 11/28/11 2000 11/29/11 0600  Weight: 71.3 kg (157 lb 3 oz) 64.6 kg (142 lb 6.7 oz) 76 kg (167 lb 8.8 oz)    Intake/Output: I/O last 3 completed shifts: In: 13018.7 [I.V.:4278.7; Blood:4200; NG/GT:60; IV Piggyback:4480] Out: 801 [Urine:689; Other:112]   Intake/Output this shift:    Sedated and Intubated CVS- RRR RS- intubated and rhoncherous ABD- ascites tense EXT-marked periorbital and peripheral edema   Basic Metabolic Panel:  Lab 11/29/11 1610 11/28/11 2230 11/28/11 0415 11/27/11 0330 11/26/11 1718  NA 144 140 140 143 142  K 3.6 4.4 3.9 3.1* 3.3*  CL 100 101 94* 96 95*  CO2 21 21 32 33* 33*  GLUCOSE 151* 199* 86 76 81  BUN 44* 52* 60* 59* 60*  CREATININE 2.45* 3.32* 3.58* 3.63* 3.78*  CALCIUM 7.4* 6.4* 8.5 -- --  MG 1.6 -- -- -- --  PHOS 7.9* -- 6.4* -- --    Liver Function Tests:  Lab 11/29/11 0224 11/28/11 2230 11/28/11 0415 11/27/11 0330 11/26/11 1718  AST 56* 13 -- 24 23  ALT 15 <5 -- <5 <5  ALKPHOS 62 17* -- 38* 39  BILITOT 0.4 0.1* -- 0.1* 0.2*  PROT 6.1 2.7* -- 6.3 6.5  ALBUMIN 2.8*2.7* 1.1* 1.4* 1.4* 1.5*   No results found for this  basename: LIPASE:5,AMYLASE:5 in the last 168 hours  Lab 11/26/11 1718  AMMONIA 42    CBC:  Lab 11/29/11 0224 11/28/11 2230 11/28/11 0400 11/27/11 0249 11/26/11 1718  WBC 1.9* 5.5 6.6 6.2 6.3  NEUTROABS -- 3.5 -- 3.8 --  HGB 14.6 1.3* 9.0* 8.9* 9.3*  HCT 41.8 4.3* 29.0* 28.4* 30.1*  MCV 86.0 102.4* 102.8* 103.3* 102.7*  PLT 46* 25* 69* 81* 84*    Cardiac Enzymes:  Lab 11/29/11 0251 11/28/11 2051  CKTOTAL -- 54  CKMB -- 1.9  CKMBINDEX -- --  TROPONINI <0.30 <0.30    BNP: No components found with this basename: POCBNP:5  CBG:  Lab 11/29/11 0355 11/29/11 0046 11/28/11 2120 11/28/11 1902 11/27/11 1047  GLUCAP 87 191* 207* 127* 99    Microbiology: Results for orders placed during the hospital encounter of 11/26/11  CULTURE, BLOOD (ROUTINE X 2)     Status: Normal (Preliminary result)   Collection Time   11/26/11  9:25 PM      Component Value Range Status Comment   Specimen Description BLOOD ARM LEFT   Final    Special Requests BOTTLES DRAWN AEROBIC AND ANAEROBIC 10CC   Final    Culture  Setup Time 11/27/2011 04:16   Final    Culture     Final    Value:  BLOOD CULTURE RECEIVED NO GROWTH TO DATE CULTURE WILL BE HELD FOR 5 DAYS BEFORE ISSUING A FINAL NEGATIVE REPORT   Report Status PENDING   Incomplete   CULTURE, BLOOD (ROUTINE X 2)     Status: Normal (Preliminary result)   Collection Time   11/26/11  9:35 PM      Component Value Range Status Comment   Specimen Description BLOOD ARM LEFT   Final    Special Requests BOTTLES DRAWN AEROBIC AND ANAEROBIC 10CC   Final    Culture  Setup Time 11/27/2011 04:16   Final    Culture     Final    Value:        BLOOD CULTURE RECEIVED NO GROWTH TO DATE CULTURE WILL BE HELD FOR 5 DAYS BEFORE ISSUING A FINAL NEGATIVE REPORT   Report Status PENDING   Incomplete   MRSA PCR SCREENING     Status: Normal   Collection Time   11/27/11  1:56 AM      Component Value Range Status Comment   MRSA by PCR NEGATIVE  NEGATIVE Final      Coagulation Studies:  Basename 11/29/11 0250 12-13-2011 2230 12/13/2011 0400  LABPROT 16.3* 30.0* 15.8*  INR 1.29 2.81* 1.23    Urinalysis:  Basename 11/26/11 1736  COLORURINE YELLOW  LABSPEC 1.017  PHURINE 5.0  GLUCOSEU NEGATIVE  HGBUR LARGE*  BILIRUBINUR NEGATIVE  KETONESUR NEGATIVE  PROTEINUR NEGATIVE  UROBILINOGEN 0.2  NITRITE NEGATIVE  LEUKOCYTESUR TRACE*      Imaging: Dg Chest Port 1 View  11/29/2011  *RADIOLOGY REPORT*  Clinical Data: CVP placement.  PORTABLE CHEST - 1 VIEW  Comparison: 12-13-2011  Findings: Since the previous study, the left subclavian catheter has been removed.  The left internal jugular catheter has been pulled back.  The tip is now projected over the mid mediastinum consistent with location in the confluence of the brachycephalic vein and superior vena cava.  Right central venous catheter, endotracheal tube, and enteric tubes are unchanged in position. Again, there is shallow inspiration with cardiac enlargement and pulmonary vascular congestion.  Atelectasis or consolidation in the lung bases.  IMPRESSION: Left internal jugular venous catheter tip is projected over the confluence of the brachial cephalic vein and SVC.  Appliances are otherwise stable.  Persistent cardiac enlargement with pulmonary vascular congestion.  Basilar atelectasis or consolidation.   Original Report Authenticated By: Marlon Pel, M.D.    Dg Chest Portable 1 View  11/29/2011  *RADIOLOGY REPORT*  Clinical Data:  PORTABLE CHEST - 1 VIEW  Comparison: Dec 13, 2011 at 2210 hours  Findings: Endotracheal tube tip measures 1.4 cm above the carina. Enteric tube tip is not visualized but is below the left hemidiaphragm consistent with location at least in the stomach. There appear to be two left central venous catheters, one via internal jugular and one via subclavian approach.  The catheter tips are both located across the midline in the right axillary vein region.  No pneumothorax.   Cardiac enlargement with prominent pulmonary vascularity.  Perihilar airspace disease and air bronchograms may represent edema or pneumonia.  IMPRESSION: Left central venous catheter tips are projected over the right axillary vein and should be pulled back.  Otherwise stable appearance of the chest.   Original Report Authenticated By: Marlon Pel, M.D.    Dg Chest Port 1 View  12-13-2011  *RADIOLOGY REPORT*  Clinical Data: Repositioning of endotracheal tube. Ventilator dependent respiratory failure.  Acute renal failure.  PORTABLE CHEST - 1 VIEW  Comparison:  Prior today  Findings: The endotracheal tube has now been pulled back with the tip approximately 2 cm above the trachea.  Right jugular dual lumen center venous dialysis catheter and nasogastric remain in appropriate position.  Worsening consolidation or collapse is seen involving the retrocardiac left lung.  Right basilar atelectasis is stable.  IMPRESSION:  1.  Endotracheal tube tip now approximately 2 cm above carina. 2.  Increased left retrocardiac consolidation versus collapse. 3.  Stable mild right basilar atelectasis.   Original Report Authenticated By: Danae Orleans, M.D.    Dg Chest Port 1 View  11/28/2011  *RADIOLOGY REPORT*  Clinical Data: Status post endotracheal tube placement  PORTABLE CHEST - 1 VIEW  Comparison: 11/27/2011  Findings: There is been interval placement of an endotracheal tube. It is directed into the right mainstem bronchus and should be withdrawn 1-2 cm. Decreased aeration of the left lung base is again noted.  A nasogastric catheter is noted within the stomach.  A dialysis catheter is seen and stable.  IMPRESSION: Persistent changes in the left lung base.  Placement of endotracheal tube in to the right mainstem bronchus. This should be withdrawn 1-2 cm.  Nasogastric catheter the stomach.   Original Report Authenticated By: Phillips Odor, M.D.    Dg Chest Port 1 View  11/27/2011  *RADIOLOGY REPORT*  Clinical Data:  Follow up infiltrate  PORTABLE CHEST - 1 VIEW  Comparison: 11/26/2011  Findings: Low lung volumes.  Patchy left mid/lower lobe opacities, unchanged.  The heart is top norma in size for inspiration.  No pneumothorax is seen.  IMPRESSION: Patchy left mid/lower lobe opacities, unchanged.   Original Report Authenticated By: Charline Bills, M.D.      Medications:      . DOPamine Stopped (11/29/11 0252)  . norepinephrine (LEVOPHED) Adult infusion Stopped (11/29/11 0444)  . dialysis replacement fluid (prismasate) 400 mL/hr at 11/29/11 0358  . dialysis replacement fluid (prismasate) 200 mL/hr at 11/29/11 0359  . dialysate (PRISMASATE) 1,500 mL/hr at 11/29/11 0358  . vasopressin (PITRESSIN) infusion - *FOR SHOCK* Stopped (11/29/11 0419)  . DISCONTD: DOPamine Stopped (11/28/11 2232)  . DISCONTD: norepinephrine (LEVOPHED) Adult infusion    . DISCONTD: phenylephrine (NEO-SYNEPHRINE) Adult infusion Stopped (11/28/11 2112)      . albumin human  12.5 g Intravenous Once  . albumin human  25 g Intravenous Once  . antiseptic oral rinse  1 application Mouth Rinse QID  . calcium gluconate  2 g Intravenous Once  .  ceFAZolin (ANCEF) IV  1 g Intravenous Once  . chlorhexidine  15 mL Mouth/Throat BID  . darbepoetin  60 mcg Subcutaneous Q Wed-1800  . desmopressin (DDAVP) IV  28 mcg Intravenous Once  . DOPamine      . ferrous sulfate  325 mg Oral TID WC  . insulin aspart  0-15 Units Subcutaneous Q4H  . levETIRAcetam  250 mg Oral BID  . pantoprazole (PROTONIX) IV  40 mg Intravenous Q24H  . piperacillin-tazobactam  3.375 g Intravenous Q6H  . sodium bicarbonate      . sodium bicarbonate  50 mEq Intravenous Once  . sodium chloride  1,000 mL Intravenous Once  . sodium chloride  1,000 mL Intravenous Once  . sodium chloride  500 mL Intravenous Once  . sodium chloride  500 mL Intravenous Once  . sodium chloride  500 mL Intravenous Once  . sodium chloride  500 mL Intravenous Once  . vancomycin  1,250 mg  Intravenous Once  . vancomycin  750 mg  Intravenous Q24H  . DISCONTD: amLODipine  5 mg Oral Daily  . DISCONTD: carvedilol  25 mg Oral BID WC  . DISCONTD: furosemide  160 mg Intravenous Q6H  . DISCONTD: heparin subcutaneous  5,000 Units Subcutaneous Q8H  . DISCONTD: sodium bicarbonate  650 mg Oral TID   acetaminophen, acetaminophen, alteplase, diphenhydrAMINE, fentaNYL, fentaNYL, heparin, midazolam, midazolam, ondansetron (ZOFRAN) IV, ondansetron, sodium chloride, sodium chloride  Assessment/ Plan:   1.Acute oliguric renal failure on background of Chronic renal failure. Now started on CVVHD 2. HTN/Vol now off pressors but very edematous and return of ascites, with hypotension fluid removal is going to be a challenge 3. Ventilator dependent respiratory failure 4. Anoxic Brain Injury following PEA arrest 5. Tense ascites. re accumulation after volume removal 5. Cardiomyopathy. Repeat 2D Echo ordered 6. MPGN with C3 nephropathy  Very unfortunate situation with multiorgan dysfunction and failure, now on ventilator support and CVVHDF.    LOS: 3 Heather Sims @TODAY @7 :06 AM

## 2011-11-29 NOTE — Progress Notes (Signed)
Subjective: Remains intubated , on CVVH, awake. Mother at bedside.  Objective: Vital signs in last 24 hours: Temp:  [88.6 F (31.4 C)-98.5 F (36.9 C)] 96.6 F (35.9 C) (09/08 0900) Pulse Rate:  [62-139] 91  (09/08 0900) Resp:  [0-26] 20  (09/08 0900) BP: (51-164)/(22-110) 102/75 mmHg (09/08 0800) SpO2:  [84 %-100 %] 99 % (09/08 0900) Arterial Line BP: (94-164)/(60-115) 147/93 mmHg (09/08 0900) FiO2 (%):  [60 %-100 %] 90 % (09/08 0900) Weight:  [142 lb 6.7 oz (64.6 kg)-167 lb 8.8 oz (76 kg)] 167 lb 8.8 oz (76 kg) (09/08 0600) Last BM Date: 11/27/11  Intake/Output from previous day: 09/07 0701 - 09/08 0700 In: 13018.7 [I.V.:4278.7; Blood:4200; NG/GT:60; IV Piggyback:4480] Out: 591 [Urine:479] Intake/Output this shift: Total I/O In: 20 [I.V.:20] Out: 55 [Urine:15; Other:40]  General appearance: Eyes open to voice, remains on vent, on CVVH. Chest: coarse BS bilaterally Cardiac: RRR, no M/R/G Hr 90's   Abdomen: distended, firm, no BS,  No ecchymosis.  Maintaining BP off pressors BP; 153/90 currently On CVVH "keeping even" 20/ml/hr pull off. Temp 96.6 (on bear hugger) WBC trending down, BUN and creat slowly improving  Lab Results:   Basename 11/29/11 0224 11/28/11 2230  WBC 1.9* 5.5  HGB 14.6 1.3*  HCT 41.8 4.3*  PLT 46* 25*   BMET  Basename 11/29/11 0224 11/28/11 2230  NA 144 140  K 3.6 4.4  CL 100 101  CO2 21 21  GLUCOSE 151* 199*  BUN 44* 52*  CREATININE 2.45* 3.32*  CALCIUM 7.4* 6.4*   PT/INR  Basename 11/29/11 0250 11/28/11 2230  LABPROT 16.3* 30.0*  INR 1.29 2.81*   ABG  Basename 11/29/11 0656 11/29/11 0406  PHART 7.458* 7.353  HCO3 24.5* 24.0    Studies/Results: Dg Chest Port 1 View  11/29/2011  *RADIOLOGY REPORT*  Clinical Data: CVP placement.  PORTABLE CHEST - 1 VIEW  Comparison: 11/28/2011  Findings: Since the previous study, the left subclavian catheter has been removed.  The left internal jugular catheter has been pulled back.  The tip  is now projected over the mid mediastinum consistent with location in the confluence of the brachycephalic vein and superior vena cava.  Right central venous catheter, endotracheal tube, and enteric tubes are unchanged in position. Again, there is shallow inspiration with cardiac enlargement and pulmonary vascular congestion.  Atelectasis or consolidation in the lung bases.  IMPRESSION: Left internal jugular venous catheter tip is projected over the confluence of the brachial cephalic vein and SVC.  Appliances are otherwise stable.  Persistent cardiac enlargement with pulmonary vascular congestion.  Basilar atelectasis or consolidation.   Original Report Authenticated By: Marlon Pel, M.D.    Dg Chest Portable 1 View  11/29/2011  *RADIOLOGY REPORT*  Clinical Data:  PORTABLE CHEST - 1 VIEW  Comparison: 11/28/2011 at 2210 hours  Findings: Endotracheal tube tip measures 1.4 cm above the carina. Enteric tube tip is not visualized but is below the left hemidiaphragm consistent with location at least in the stomach. There appear to be two left central venous catheters, one via internal jugular and one via subclavian approach.  The catheter tips are both located across the midline in the right axillary vein region.  No pneumothorax.  Cardiac enlargement with prominent pulmonary vascularity.  Perihilar airspace disease and air bronchograms may represent edema or pneumonia.  IMPRESSION: Left central venous catheter tips are projected over the right axillary vein and should be pulled back.  Otherwise stable appearance of the chest.  Original Report Authenticated By: Marlon Pel, M.D.    Dg Chest Port 1 View  11/28/2011  *RADIOLOGY REPORT*  Clinical Data: Repositioning of endotracheal tube. Ventilator dependent respiratory failure.  Acute renal failure.  PORTABLE CHEST - 1 VIEW  Comparison: Prior today  Findings: The endotracheal tube has now been pulled back with the tip approximately 2 cm above the  trachea.  Right jugular dual lumen center venous dialysis catheter and nasogastric remain in appropriate position.  Worsening consolidation or collapse is seen involving the retrocardiac left lung.  Right basilar atelectasis is stable.  IMPRESSION:  1.  Endotracheal tube tip now approximately 2 cm above carina. 2.  Increased left retrocardiac consolidation versus collapse. 3.  Stable mild right basilar atelectasis.   Original Report Authenticated By: Danae Orleans, M.D.    Dg Chest Port 1 View  11/28/2011  *RADIOLOGY REPORT*  Clinical Data: Status post endotracheal tube placement  PORTABLE CHEST - 1 VIEW  Comparison: 11/27/2011  Findings: There is been interval placement of an endotracheal tube. It is directed into the right mainstem bronchus and should be withdrawn 1-2 cm. Decreased aeration of the left lung base is again noted.  A nasogastric catheter is noted within the stomach.  A dialysis catheter is seen and stable.  IMPRESSION: Persistent changes in the left lung base.  Placement of endotracheal tube in to the right mainstem bronchus. This should be withdrawn 1-2 cm.  Nasogastric catheter the stomach.   Original Report Authenticated By: Phillips Odor, M.D.    Dg Chest Port 1 View  11/27/2011  *RADIOLOGY REPORT*  Clinical Data: Follow up infiltrate  PORTABLE CHEST - 1 VIEW  Comparison: 11/26/2011  Findings: Low lung volumes.  Patchy left mid/lower lobe opacities, unchanged.  The heart is top norma in size for inspiration.  No pneumothorax is seen.  IMPRESSION: Patchy left mid/lower lobe opacities, unchanged.   Original Report Authenticated By: Charline Bills, M.D.     Anti-infectives: Anti-infectives     Start     Dose/Rate Route Frequency Ordered Stop   11/29/11 2200  piperacillin-tazobactam (ZOSYN) IVPB 3.375 g       3.375 g 100 mL/hr over 30 Minutes Intravenous 4 times per day 11/28/11 2116     11/29/11 2200   vancomycin (VANCOCIN) 750 mg in sodium chloride 0.9 % 150 mL IVPB        750  mg 150 mL/hr over 60 Minutes Intravenous Every 24 hours 11/28/11 2119     11/28/11 2200   levofloxacin (LEVAQUIN) IVPB 500 mg  Status:  Discontinued        500 mg 100 mL/hr over 60 Minutes Intravenous Every 48 hours 11/27/11 0207 11/27/11 1518   11/28/11 2200   vancomycin (VANCOCIN) 750 mg in sodium chloride 0.9 % 150 mL IVPB  Status:  Discontinued        750 mg 150 mL/hr over 60 Minutes Intravenous Every 48 hours 11/27/11 0207 11/27/11 1518   11/28/11 2200   vancomycin (VANCOCIN) 1,250 mg in sodium chloride 0.9 % 250 mL IVPB        1,250 mg 166.7 mL/hr over 90 Minutes Intravenous  Once 11/28/11 2119 11/29/11 0031   11/28/11 1400   ceFAZolin (ANCEF) IVPB 1 g/50 mL premix        1 g 100 mL/hr over 30 Minutes Intravenous  Once 11/28/11 1352 11/28/11 1422   11/27/11 2100   ceFEPIme (MAXIPIME) 1 g in dextrose 5 % 50 mL IVPB  Status:  Discontinued        1 g 100 mL/hr over 30 Minutes Intravenous Every 24 hours 11/27/11 0207 11/27/11 1518   11/27/11 0100   ceFEPIme (MAXIPIME) 1 g in dextrose 5 % 50 mL IVPB        1 g 100 mL/hr over 30 Minutes Intravenous  Once 11/26/11 2348 11/27/11 0321   11/27/11 0000   vancomycin (VANCOCIN) IVPB 1000 mg/200 mL premix        1,000 mg 200 mL/hr over 60 Minutes Intravenous  Once 11/26/11 2345 11/27/11 0158   11/26/11 2115   cefTRIAXone (ROCEPHIN) 1 g in dextrose 5 % 50 mL IVPB        1 g 100 mL/hr over 30 Minutes Intravenous  Once 11/26/11 2105 11/26/11 2215   11/26/11 2115   levofloxacin (LEVAQUIN) IVPB 750 mg  Status:  Discontinued        750 mg 100 mL/hr over 90 Minutes Intravenous Every 24 hours 11/26/11 2105 11/26/11 2311          Assessment/Plan: Patient Active Problem List  Diagnosis  . Acute heart failure  . Hypoxemia  . Pneumonia  . Anemia  . Hypertension  . ? Viral Cardiomyopathy  . Seizures  . Anoxic brain damage  . Myoclonus  . Ileitis  . CKD (chronic kidney disease)  . Hypoalbuminemia  . MPGN (membranoproliferative  glomerulonephritis), type 2  . Acute renal failure  . Dehydration  . C. difficile colitis  . Seizure disorder, grand mal  . Encephalopathy acute  . Renal failure (ARF), acute on chronic  . Healthcare-associated pneumonia  . Cardiomyopathy  . Acute respiratory failure  . Hypotension    s/p * No surgery found * Continue supportive care No surgical intervention at this time  Management as per CCM Imaging of abdomen and pelvis when stable to seek source of ? bleed   LOS: 3 days    Shawnetta Lein 11/29/2011

## 2011-11-29 NOTE — Progress Notes (Signed)
eLink Physician-Brief Progress Note Patient Name: Heather Sims DOB: [birth year] MRN: [id number]  Date of Service  11/29/2011   HPI/Events of Note   Hypoxemic respiratory failure in setting of severe hemorrhagic shock;  Plateau 36 on PEEP 10  eICU Interventions  PEEP 10, FiO2 100% Decrease TVol 350 Start CVVHD Repeat ABG in 30 minutes   Intervention Category Major Interventions: Hypotension - evaluation and management  MCQUAID, DOUGLAS 11/29/2011, 3:08 AM

## 2011-11-29 NOTE — Progress Notes (Signed)
CRITICAL VALUE ALERT  Critical value received: Calcium 6.4  Date of notification: 11/29/11  Time of notification:  0005  Critical value read back:yes  Nurse who received alert:  Leonia Reeves  MD notified (1st page):  Dr. Kendrick Fries  Time of first page:  0015  MD notified (2nd page):  Time of second page:  Responding MD:  Dr. Kendrick Fries  Time MD responded:  [phone]

## 2011-11-29 NOTE — Progress Notes (Signed)
Changed from 10 to 14 per [facility] protocol

## 2011-11-29 NOTE — Procedures (Signed)
Central Venous Catheter Insertion Procedure Note Heather Sims [id number] 08/05/1990  Procedure: Insertion of Central Venous Catheter Indications: Assessment of intravascular volume, Drug and/or fluid administration and Frequent blood sampling  Procedure Details Consent: Risks of procedure as well as the alternatives and risks of each were explained to the (patient/caregiver).  Consent for procedure obtained. Time Out: Verified patient identification, verified procedure, site/side was marked, verified correct patient position, special equipment/implants available, medications/allergies/relevent history reviewed, required imaging and test results available.  Performed  Maximum sterile technique was used including antiseptics, cap, gloves, gown, hand hygiene, mask and sheet. Skin prep: Chlorhexidine; local anesthetic administered A antimicrobial bonded/coated triple lumen catheter was placed in the left internal jugular vein using the Seldinger technique.  Evaluation Blood flow good Complications: No apparent complications Patient did tolerate procedure well. Chest X-ray ordered to verify placement.  CXR: cather directed to right axillary vein.  Cather pulled back 6 cm with improved position.Marland Kitchen  Heather Sims 11/29/2011, 3:19 AM

## 2011-11-30 ENCOUNTER — Inpatient Hospital Stay (HOSPITAL_COMMUNITY): Payer: Medicaid Other

## 2011-11-30 ENCOUNTER — Encounter (HOSPITAL_COMMUNITY): Payer: Medicaid Other

## 2011-11-30 DIAGNOSIS — D62 Acute posthemorrhagic anemia: Secondary | ICD-10-CM

## 2011-11-30 LAB — TYPE AND SCREEN
Unit division: 0
Unit division: 0
Unit division: 0
Unit division: 0
Unit division: 0
Unit division: 0

## 2011-11-30 LAB — RENAL FUNCTION PANEL
Albumin: 1.8 g/dL — ABNORMAL LOW (ref 3.5–5.2)
Albumin: 1.6 g/dL — ABNORMAL LOW (ref 3.5–5.2)
BUN: 17 mg/dL (ref 6–23)
Calcium: 7.9 mg/dL — ABNORMAL LOW (ref 8.4–10.5)
Creatinine, Ser: 1.51 mg/dL — ABNORMAL HIGH (ref 0.50–1.10)
GFR calc Af Amer: 46 mL/min — ABNORMAL LOW (ref >90)
GFR calc non Af Amer: 40 mL/min — ABNORMAL LOW (ref >90)
Glucose, Bld: 87 mg/dL (ref 70–99)
Phosphorus: 4.1 mg/dL (ref 2.3–4.6)
Phosphorus: 3.5 mg/dL (ref 2.3–4.6)
Potassium: 3.9 mEq/L (ref 3.5–5.1)
Potassium: 4.1 mEq/L (ref 3.5–5.1)
Sodium: 137 mEq/L (ref 135–145)

## 2011-11-30 LAB — PREPARE FRESH FROZEN PLASMA
Unit division: 0
Unit division: 0
Unit division: 0

## 2011-11-30 LAB — CBC
MCHC: 34.8 g/dL (ref 30.0–36.0)
Platelets: 41 10*3/uL — ABNORMAL LOW (ref 150–400)
RDW: 15.6 % — ABNORMAL HIGH (ref 11.5–15.5)
WBC: 14.2 10*3/uL — ABNORMAL HIGH (ref 4.0–10.5)

## 2011-11-30 LAB — MAGNESIUM: Magnesium: 1.9 mg/dL (ref 1.5–2.5)

## 2011-11-30 LAB — PREPARE PLATELET PHERESIS

## 2011-11-30 LAB — HAPTOGLOBIN: Haptoglobin: 122 mg/dL (ref 45–215)

## 2011-11-30 LAB — GLUCOSE, CAPILLARY
Glucose-Capillary: 95 mg/dL (ref 70–99)
Glucose-Capillary: 67 mg/dL — ABNORMAL LOW (ref 70–99)

## 2011-11-30 LAB — APTT: aPTT: 39 seconds — ABNORMAL HIGH (ref 24–37)

## 2011-11-30 MED ORDER — NEPRO/CARBSTEADY PO LIQD
237.0000 mL | ORAL | Status: DC
  Administered 2011-11-30: 237 mL
  Filled 2011-11-30 (×2): qty 237

## 2011-11-30 MED ORDER — PRO-STAT SUGAR FREE PO LIQD: 30.0000 mL | Freq: Two times a day (BID) | ORAL | Status: DC

## 2011-11-30 MED ORDER — FENTANYL CITRATE 0.05 MG/ML IJ SOLN
100.0000 ug/h | INTRAMUSCULAR | Status: DC
  Filled 2011-11-30: qty 50

## 2011-11-30 MED ORDER — NEPRO/CARBSTEADY PO LIQD
237.0000 mL | ORAL | Status: DC
  Filled 2011-11-30: qty 237

## 2011-11-30 MED ORDER — OSMOLITE 1.5 CAL PO LIQD: 1000.0000 mL | ORAL | Status: DC

## 2011-11-30 MED ORDER — NEPRO/CARBSTEADY PO LIQD
1000.0000 mL | ORAL | Status: DC
  Administered 2011-11-30: 1000 mL
  Filled 2011-11-30 (×3): qty 1000

## 2011-11-30 NOTE — Progress Notes (Signed)
[facility] ROUNDING NOTE   Subjective:   Interval History: 9/8 Paracentesis removed 10 L, hemodynamic instability and bleeding, intubated with bear hugger 9/9 CVVH started, pressors off, BP stable  The patient is awake still intubated. Seems to understand conversation and states the only pain she is having is in her throat from the tube. Still off pressors, with bear hugger. BP stable 100s/60s. CVVH going with 150-200 cc/hr removal. She is having less edema. No other complaints. Follows basic commands. Spoke with mother in the room today and answered questions.   Objective:  Vital signs in last 24 hours:  Temp:  [86.4 F (30.2 C)-99.7 F (37.6 C)] 97.1 F (36.2 C) (09/09 0800) Pulse Rate:  [63-98] 95  (09/09 1000) Resp:  [8-26] 19  (09/09 1000) BP: (83-161)/(64-119) 126/94 mmHg (09/09 1000) SpO2:  [90 %-100 %] 100 % (09/09 1000) Arterial Line BP: (97-182)/(64-111) 150/92 mmHg (09/09 1000) FiO2 (%):  [50 %-90 %] 50 % (09/09 0750) Weight:  [160 lb 4.4 oz (72.7 kg)] 160 lb 4.4 oz (72.7 kg) (09/09 0400)  Weight change: 17 lb 13.7 oz (8.1 kg) Filed Weights   11/28/11 2000 11/29/11 0600 11/30/11 0400  Weight: 142 lb 6.7 oz (64.6 kg) 167 lb 8.8 oz (76 kg) 160 lb 4.4 oz (72.7 kg)    Intake/Output: I/O last 3 completed shifts: In: 14338.8 [I.V.:5366.3; Blood:4200; NG/GT:180; IV Piggyback:4592.5] Out: 5306 [Urine:194; Emesis/NG output:300; ZOXWR:6045; Stool:1]   Intake/Output this shift:  Total I/O In: 330 [I.V.:300; NG/GT:30] Out: 468 [Other:468]  Vent Mode:  [-] PRVC FiO2 (%):  [50 %-90 %] 50 % Set Rate:  [20 bmp-24 bmp] 20 bmp Vt Set:  [350 mL] 350 mL PEEP:  [8 cmH20-14 cmH20] 8 cmH20 Plateau Pressure:  [20 cmH20-29 cmH20] 23 cmH20  PE: Intubated, awake, follows basic commands CVS- RRR RS- intubated and mildly rhoncherous ABD- ascites tense EXT-some periorbital and peripheral edema   Basic Metabolic Panel:  Lab 11/30/11 4098 11/29/11 1610 11/29/11  0224 11/28/11 2230 11/28/11 0415  NA 137 140 144 140 140  K 3.9 3.2* 3.6 4.4 3.9  CL 102 104 100 101 94*  CO2 27 27 21 21  32  GLUCOSE 87 201* 151* 199* 86  BUN 22 29* 44* 52* 60*  CREATININE 1.77* 2.02* 2.45* 3.32* 3.58*  CALCIUM 7.9* 7.1* 7.4* -- --  MG 1.9 -- 1.6 -- --  PHOS 4.1 4.2 7.9* -- 6.4*    Liver Function Tests:  Lab 11/30/11 0510 11/29/11 1610 11/29/11 0224 11/28/11 2230 11/28/11 0415 11/27/11 0330 11/26/11 1718  AST -- -- 56* 13 -- 24 23  ALT -- -- 15 <5 -- <5 <5  ALKPHOS -- -- 62 17* -- 38* 39  BILITOT -- -- 0.4 0.1* -- 0.1* 0.2*  PROT -- -- 6.1 2.7* -- 6.3 6.5  ALBUMIN 1.8* 1.7* 2.8*2.7* 1.1* 1.4* -- --    Lab 11/26/11 1718  AMMONIA 42    CBC:  Lab 11/30/11 0510 11/29/11 1600 11/29/11 1200 11/29/11 0224 11/28/11 2230 11/27/11 0249  WBC 14.2* 6.7 4.8 1.9* 5.5 --  NEUTROABS -- -- -- -- 3.5 3.8  HGB 13.6 12.0 12.4 14.6 1.3* --  HCT 39.1 34.4* 35.6* 41.8 4.3* --  MCV 84.8 83.5 83.6 86.0 102.4* --  PLT 41* 36* 36* 46* 25* --    Cardiac Enzymes:  Lab 11/29/11 1200 11/29/11 0251 11/28/11 2051  CKTOTAL -- -- 54  CKMB -- -- 1.9  CKMBINDEX -- -- --  TROPONINI <0.30 <0.30 <0.30  CBG:  Lab 11/30/11 0803 11/30/11 0348 12-21-2011 2354 12/21/2011 2004 12/21/11 1614  GLUCAP 68* 70 74 78 196*   Coagulation Studies:  Basename 12-21-2011 1200 Dec 21, 2011 0250 11/28/11 2230 11/28/11 0400  LABPROT 19.2* 16.3* 30.0* 15.8*  INR 1.58* 1.29 2.81* 1.23   Imaging: Ir Fluoro Guide Cv Line Right  12/21/2011  *RADIOLOGY REPORT*  IR PLACEMENT OF A TUNNELED HEMODIALYSIS CATHETER UNDER FLUOROSCOPIC AND ULTRASOUND GUIDANCE; IR ULTRASOUND PARACENTESIS  Date: 11/28/2011  Clinical History: 21 year old female with a complex medical history including anoxic brain injury, nephritis, acute on chronic renal failure.  Additionally, she has massive abdominal ascites.  She requires central venous access for hemodialysis.  A paracentesis has also been requested for both diagnostic, and  therapeutic purposes.  Procedures Performed: 1. Ultrasound-guided puncture of right internal jugular vein 2.  Placement of a tunneled hemodialysis catheter under fluoroscopic guidance 3.  Ultrasound-guided large volume paracentesis  Interventional Radiologist:  Sterling Big, MD  Sedation: Moderate (conscious) sedation was used.  0.5 mg Versed, 25 mcg Fentanyl were administered intravenously.  The patient's vital signs were monitored continuously by radiology nursing throughout the procedure.  Sedation Time: 99 minutes  Fluoroscopy time: 0.5 minutes  PROCEDURE/FINDINGS:   Informed consent was obtained from the patient's mother following explanation of the procedure, risks, benefits and alternatives. She understands, agrees and consents for the procedure.  All questions were addressed. A time out was performed.  Maximal barrier sterile technique utilized including caps, mask, sterile gowns, sterile gloves, large sterile drape, hand hygiene, and betadine skin prep.  The right neck was interrogated with ultrasound.  The right IJ was found to be widely patent.  Local anesthesia was achieved with infiltration of 1% lidocaine. A small dermatotomy is made with an 11 blade.  Under direct sonographic guidance, the vessel was punctured with a 21-gauge micropuncture needle and a 5-French transitional sheath advanced into the vein.  Measurements to the mid right atrium were then performed with a J-wire.  A 19 cm tip to cuff HemoSplit hemodialysis catheter was selected.  An appropriate skin entry site was selected several centimeters below the clavicle.  Local anesthesia was again achieved with infiltration of 1% lidocaine.  A small incision was made at the skin exit site. The hemodialysis catheter was then tunneled from the skin exit site under the skin, over the clavicle and out to the dermatotomy overlying the venous access site.  The transitional sheath was then exchanged for a peel-away sheath.  The catheter was  advanced through the peel-away sheath and the tip positioned at the superior cavoatrial junction/high right atrium under fluoroscopic guidance.  The catheter aspirates and flushes well.  The catheter was then flushed, and locked with 1000 unit/ml heparinized saline.  The catheter was secured to the skin with 2-0 Prolene suture. The dermatotomy overlying the venous access site was closed with Dermabond.  A sterile bandage was placed.  Attention was then turned to the right lower quadrant.  The right lower quadrant was interrogated with ultrasound.  There is a large volume of sonographically simple-appearing ascites.  An appropriate skin entry site on the lateral right abdominal wall was selected and marked.  Local anesthesia was achieved with infiltration of 1% lidocaine.  A small skin nick was made with an 11 blade scalpel. Under direct sonographic guidance, a 5-French Yueh centesis catheter was advanced to the skin and soft tissues and the peritoneal lining was penetrated.  The tip of the needle was confirmed with and a large volume  of ascites, far from bowel or other critical structures.  The needle was then removed and a short Amplatz wire advanced through the UV centesis catheter.  The centesis catheter was then exchanged for an 8-French pigtail drainage catheter.  The pigtail was formed and the catheter connected to low wall suction.  Approximately 10,000 ml of clear yellow ascitic fluid was then successfully aspirated without complication.  No evidence of bleeding during the procedure.  While flow through the catheter ceased, the abdomen was again interrogated with ultrasound.  No evidence of residual fluid in the left or right lower quadrant.  Therefore, suction was removed, the probe cut from the pigtail catheter and a pigtail catheter removed with gentle traction.  A sterile bandage was applied.  There is no immediate complication.  The patient tolerated the procedure well and was returned to the intensive  care unit in unchanged condition.  IMPRESSION:  1.  Successful placement of a right IJ approach 19 cm tip-to-cuff tunneled hemodialysis catheter. The catheter tip is positioned at the superior cavoatrial junction/high right atrium and is ready for immediate use.  2.  Successful ultrasound guided large volume paracentesis.  10 liters of clear yellow ascitic fluid was aspirated without evidence of immediate complication.  A sample of fluid was sent to the lab for further analysis.  25 mg of 25% albumin was ordered for on chronic replacement.  Signed,  Sterling Big, MD Vascular & Interventional Radiologist [facility] Radiology   Original Report Authenticated By: Vilma Prader    Ir US Guide Vasc Access Right  11/29/2011  *RADIOLOGY REPORT*  IR PLACEMENT OF A TUNNELED HEMODIALYSIS CATHETER UNDER FLUOROSCOPIC AND ULTRASOUND GUIDANCE; IR ULTRASOUND PARACENTESIS  Date: 11/28/2011  Clinical History: 21 year old female with a complex medical history including anoxic brain injury, nephritis, acute on chronic renal failure.  Additionally, she has massive abdominal ascites.  She requires central venous access for hemodialysis.  A paracentesis has also been requested for both diagnostic, and therapeutic purposes.  Procedures Performed: 1. Ultrasound-guided puncture of right internal jugular vein 2.  Placement of a tunneled hemodialysis catheter under fluoroscopic guidance 3.  Ultrasound-guided large volume paracentesis  Interventional Radiologist:  Sterling Big, MD  Sedation: Moderate (conscious) sedation was used.  0.5 mg Versed, 25 mcg Fentanyl were administered intravenously.  The patient's vital signs were monitored continuously by radiology nursing throughout the procedure.  Sedation Time: 99 minutes  Fluoroscopy time: 0.5 minutes  PROCEDURE/FINDINGS:   Informed consent was obtained from the patient's mother following explanation of the procedure, risks, benefits and alternatives. She understands, agrees and  consents for the procedure.  All questions were addressed. A time out was performed.  Maximal barrier sterile technique utilized including caps, mask, sterile gowns, sterile gloves, large sterile drape, hand hygiene, and betadine skin prep.  The right neck was interrogated with ultrasound.  The right IJ was found to be widely patent.  Local anesthesia was achieved with infiltration of 1% lidocaine. A small dermatotomy is made with an 11 blade.  Under direct sonographic guidance, the vessel was punctured with a 21-gauge micropuncture needle and a 5-French transitional sheath advanced into the vein.  Measurements to the mid right atrium were then performed with a J-wire.  A 19 cm tip to cuff HemoSplit hemodialysis catheter was selected.  An appropriate skin entry site was selected several centimeters below the clavicle.  Local anesthesia was again achieved with infiltration of 1% lidocaine.  A small incision was made at the skin exit site. The hemodialysis  catheter was then tunneled from the skin exit site under the skin, over the clavicle and out to the dermatotomy overlying the venous access site.  The transitional sheath was then exchanged for a peel-away sheath.  The catheter was advanced through the peel-away sheath and the tip positioned at the superior cavoatrial junction/high right atrium under fluoroscopic guidance.  The catheter aspirates and flushes well.  The catheter was then flushed, and locked with 1000 unit/ml heparinized saline.  The catheter was secured to the skin with 2-0 Prolene suture. The dermatotomy overlying the venous access site was closed with Dermabond.  A sterile bandage was placed.  Attention was then turned to the right lower quadrant.  The right lower quadrant was interrogated with ultrasound.  There is a large volume of sonographically simple-appearing ascites.  An appropriate skin entry site on the lateral right abdominal wall was selected and marked.  Local anesthesia was achieved  with infiltration of 1% lidocaine.  A small skin nick was made with an 11 blade scalpel. Under direct sonographic guidance, a 5-French Yueh centesis catheter was advanced to the skin and soft tissues and the peritoneal lining was penetrated.  The tip of the needle was confirmed with and a large volume of ascites, far from bowel or other critical structures.  The needle was then removed and a short Amplatz wire advanced through the UV centesis catheter.  The centesis catheter was then exchanged for an 8-French pigtail drainage catheter.  The pigtail was formed and the catheter connected to low wall suction.  Approximately 10,000 ml of clear yellow ascitic fluid was then successfully aspirated without complication.  No evidence of bleeding during the procedure.  While flow through the catheter ceased, the abdomen was again interrogated with ultrasound.  No evidence of residual fluid in the left or right lower quadrant.  Therefore, suction was removed, the probe cut from the pigtail catheter and a pigtail catheter removed with gentle traction.  A sterile bandage was applied.  There is no immediate complication.  The patient tolerated the procedure well and was returned to the intensive care unit in unchanged condition.  IMPRESSION:  1.  Successful placement of a right IJ approach 19 cm tip-to-cuff tunneled hemodialysis catheter. The catheter tip is positioned at the superior cavoatrial junction/high right atrium and is ready for immediate use.  2.  Successful ultrasound guided large volume paracentesis.  10 liters of clear yellow ascitic fluid was aspirated without evidence of immediate complication.  A sample of fluid was sent to the lab for further analysis.  25 mg of 25% albumin was ordered for on chronic replacement.  Signed,  Sterling Big, MD Vascular & Interventional Radiologist [facility] Radiology   Original Report Authenticated By: Vilma Prader    Ir US Guide Bx Asp/drain  11/29/2011  *RADIOLOGY REPORT*  IR  PLACEMENT OF A TUNNELED HEMODIALYSIS CATHETER UNDER FLUOROSCOPIC AND ULTRASOUND GUIDANCE; IR ULTRASOUND PARACENTESIS  Date: 11/28/2011  Clinical History: 21 year old female with a complex medical history including anoxic brain injury, nephritis, acute on chronic renal failure.  Additionally, she has massive abdominal ascites.  She requires central venous access for hemodialysis.  A paracentesis has also been requested for both diagnostic, and therapeutic purposes.  Procedures Performed: 1. Ultrasound-guided puncture of right internal jugular vein 2.  Placement of a tunneled hemodialysis catheter under fluoroscopic guidance 3.  Ultrasound-guided large volume paracentesis  Interventional Radiologist:  Sterling Big, MD  Sedation: Moderate (conscious) sedation was used.  0.5 mg Versed, 25 mcg Fentanyl  were administered intravenously.  The patient's vital signs were monitored continuously by radiology nursing throughout the procedure.  Sedation Time: 99 minutes  Fluoroscopy time: 0.5 minutes  PROCEDURE/FINDINGS:   Informed consent was obtained from the patient's mother following explanation of the procedure, risks, benefits and alternatives. She understands, agrees and consents for the procedure.  All questions were addressed. A time out was performed.  Maximal barrier sterile technique utilized including caps, mask, sterile gowns, sterile gloves, large sterile drape, hand hygiene, and betadine skin prep.  The right neck was interrogated with ultrasound.  The right IJ was found to be widely patent.  Local anesthesia was achieved with infiltration of 1% lidocaine. A small dermatotomy is made with an 11 blade.  Under direct sonographic guidance, the vessel was punctured with a 21-gauge micropuncture needle and a 5-French transitional sheath advanced into the vein.  Measurements to the mid right atrium were then performed with a J-wire.  A 19 cm tip to cuff HemoSplit hemodialysis catheter was selected.  An  appropriate skin entry site was selected several centimeters below the clavicle.  Local anesthesia was again achieved with infiltration of 1% lidocaine.  A small incision was made at the skin exit site. The hemodialysis catheter was then tunneled from the skin exit site under the skin, over the clavicle and out to the dermatotomy overlying the venous access site.  The transitional sheath was then exchanged for a peel-away sheath.  The catheter was advanced through the peel-away sheath and the tip positioned at the superior cavoatrial junction/high right atrium under fluoroscopic guidance.  The catheter aspirates and flushes well.  The catheter was then flushed, and locked with 1000 unit/ml heparinized saline.  The catheter was secured to the skin with 2-0 Prolene suture. The dermatotomy overlying the venous access site was closed with Dermabond.  A sterile bandage was placed.  Attention was then turned to the right lower quadrant.  The right lower quadrant was interrogated with ultrasound.  There is a large volume of sonographically simple-appearing ascites.  An appropriate skin entry site on the lateral right abdominal wall was selected and marked.  Local anesthesia was achieved with infiltration of 1% lidocaine.  A small skin nick was made with an 11 blade scalpel. Under direct sonographic guidance, a 5-French Yueh centesis catheter was advanced to the skin and soft tissues and the peritoneal lining was penetrated.  The tip of the needle was confirmed with and a large volume of ascites, far from bowel or other critical structures.  The needle was then removed and a short Amplatz wire advanced through the UV centesis catheter.  The centesis catheter was then exchanged for an 8-French pigtail drainage catheter.  The pigtail was formed and the catheter connected to low wall suction.  Approximately 10,000 ml of clear yellow ascitic fluid was then successfully aspirated without complication.  No evidence of bleeding  during the procedure.  While flow through the catheter ceased, the abdomen was again interrogated with ultrasound.  No evidence of residual fluid in the left or right lower quadrant.  Therefore, suction was removed, the probe cut from the pigtail catheter and a pigtail catheter removed with gentle traction.  A sterile bandage was applied.  There is no immediate complication.  The patient tolerated the procedure well and was returned to the intensive care unit in unchanged condition.  IMPRESSION:  1.  Successful placement of a right IJ approach 19 cm tip-to-cuff tunneled hemodialysis catheter. The catheter tip is positioned at the  superior cavoatrial junction/high right atrium and is ready for immediate use.  2.  Successful ultrasound guided large volume paracentesis.  10 liters of clear yellow ascitic fluid was aspirated without evidence of immediate complication.  A sample of fluid was sent to the lab for further analysis.  25 mg of 25% albumin was ordered for on chronic replacement.  Signed,  Sterling Big, MD Vascular & Interventional Radiologist [facility] Radiology   Original Report Authenticated By: Alvino Blood Chest Port 1 View  11/30/2011  *RADIOLOGY REPORT*  Clinical Data: Follow-up respiratory failure.  Ventilator support.  PORTABLE CHEST - 1 VIEW  Comparison: [id number]  Findings: Endotracheal tube has its tip 1 cm above the carina. Nasogastric tube enters the abdomen.  Right internal jugular central line has both tips in the SVC.  There is persistent pulmonary volume loss at the bases, most advanced in the left lower lobe.  Little change of the last several films.  No developing finding.  IMPRESSION: Persistent volume loss in the lower lungs, particularly in the left lower lobe.  Lines and tubes well positioned.   Original Report Authenticated By: Thomasenia Sales, M.D.    Dg Chest Port 1 View  11/29/2011  *RADIOLOGY REPORT*  Clinical Data: CVP placement.  PORTABLE CHEST - 1 VIEW  Comparison:  11/28/2011  Findings: Since the previous study, the left subclavian catheter has been removed.  The left internal jugular catheter has been pulled back.  The tip is now projected over the mid mediastinum consistent with location in the confluence of the brachycephalic vein and superior vena cava.  Right central venous catheter, endotracheal tube, and enteric tubes are unchanged in position. Again, there is shallow inspiration with cardiac enlargement and pulmonary vascular congestion.  Atelectasis or consolidation in the lung bases.  IMPRESSION: Left internal jugular venous catheter tip is projected over the confluence of the brachial cephalic vein and SVC.  Appliances are otherwise stable.  Persistent cardiac enlargement with pulmonary vascular congestion.  Basilar atelectasis or consolidation.   Original Report Authenticated By: Marlon Pel, M.D.    Dg Chest Portable 1 View  11/29/2011  *RADIOLOGY REPORT*  Clinical Data:  PORTABLE CHEST - 1 VIEW  Comparison: 11/28/2011 at 2210 hours  Findings: Endotracheal tube tip measures 1.4 cm above the carina. Enteric tube tip is not visualized but is below the left hemidiaphragm consistent with location at least in the stomach. There appear to be two left central venous catheters, one via internal jugular and one via subclavian approach.  The catheter tips are both located across the midline in the right axillary vein region.  No pneumothorax.  Cardiac enlargement with prominent pulmonary vascularity.  Perihilar airspace disease and air bronchograms may represent edema or pneumonia.  IMPRESSION: Left central venous catheter tips are projected over the right axillary vein and should be pulled back.  Otherwise stable appearance of the chest.   Original Report Authenticated By: Marlon Pel, M.D.    Dg Chest Port 1 View  11/28/2011  *RADIOLOGY REPORT*  Clinical Data: Repositioning of endotracheal tube. Ventilator dependent respiratory failure.  Acute renal  failure.  PORTABLE CHEST - 1 VIEW  Comparison: Prior today  Findings: The endotracheal tube has now been pulled back with the tip approximately 2 cm above the trachea.  Right jugular dual lumen center venous dialysis catheter and nasogastric remain in appropriate position.  Worsening consolidation or collapse is seen involving the retrocardiac left lung.  Right basilar atelectasis is stable.  IMPRESSION:  1.  Endotracheal tube tip now approximately 2 cm above carina. 2.  Increased left retrocardiac consolidation versus collapse. 3.  Stable mild right basilar atelectasis.   Original Report Authenticated By: Danae Orleans, M.D.    Dg Chest Port 1 View  11/28/2011  *RADIOLOGY REPORT*  Clinical Data: Status post endotracheal tube placement  PORTABLE CHEST - 1 VIEW  Comparison: 11/27/2011  Findings: There is been interval placement of an endotracheal tube. It is directed into the right mainstem bronchus and should be withdrawn 1-2 cm. Decreased aeration of the left lung base is again noted.  A nasogastric catheter is noted within the stomach.  A dialysis catheter is seen and stable.  IMPRESSION: Persistent changes in the left lung base.  Placement of endotracheal tube in to the right mainstem bronchus. This should be withdrawn 1-2 cm.  Nasogastric catheter the stomach.   Original Report Authenticated By: Phillips Odor, M.D.    Ir Paracentesis  11/29/2011  *RADIOLOGY REPORT*  IR PLACEMENT OF A TUNNELED HEMODIALYSIS CATHETER UNDER FLUOROSCOPIC AND ULTRASOUND GUIDANCE; IR ULTRASOUND PARACENTESIS  Date: 11/28/2011  Clinical History: 21 year old female with a complex medical history including anoxic brain injury, nephritis, acute on chronic renal failure.  Additionally, she has massive abdominal ascites.  She requires central venous access for hemodialysis.  A paracentesis has also been requested for both diagnostic, and therapeutic purposes.  Procedures Performed: 1. Ultrasound-guided puncture of right internal jugular  vein 2.  Placement of a tunneled hemodialysis catheter under fluoroscopic guidance 3.  Ultrasound-guided large volume paracentesis  Interventional Radiologist:  Sterling Big, MD  Sedation: Moderate (conscious) sedation was used.  0.5 mg Versed, 25 mcg Fentanyl were administered intravenously.  The patient's vital signs were monitored continuously by radiology nursing throughout the procedure.  Sedation Time: 99 minutes  Fluoroscopy time: 0.5 minutes  PROCEDURE/FINDINGS:   Informed consent was obtained from the patient's mother following explanation of the procedure, risks, benefits and alternatives. She understands, agrees and consents for the procedure.  All questions were addressed. A time out was performed.  Maximal barrier sterile technique utilized including caps, mask, sterile gowns, sterile gloves, large sterile drape, hand hygiene, and betadine skin prep.  The right neck was interrogated with ultrasound.  The right IJ was found to be widely patent.  Local anesthesia was achieved with infiltration of 1% lidocaine. A small dermatotomy is made with an 11 blade.  Under direct sonographic guidance, the vessel was punctured with a 21-gauge micropuncture needle and a 5-French transitional sheath advanced into the vein.  Measurements to the mid right atrium were then performed with a J-wire.  A 19 cm tip to cuff HemoSplit hemodialysis catheter was selected.  An appropriate skin entry site was selected several centimeters below the clavicle.  Local anesthesia was again achieved with infiltration of 1% lidocaine.  A small incision was made at the skin exit site. The hemodialysis catheter was then tunneled from the skin exit site under the skin, over the clavicle and out to the dermatotomy overlying the venous access site.  The transitional sheath was then exchanged for a peel-away sheath.  The catheter was advanced through the peel-away sheath and the tip positioned at the superior cavoatrial junction/high  right atrium under fluoroscopic guidance.  The catheter aspirates and flushes well.  The catheter was then flushed, and locked with 1000 unit/ml heparinized saline.  The catheter was secured to the skin with 2-0 Prolene suture. The dermatotomy overlying the venous access site was closed  with Dermabond.  A sterile bandage was placed.  Attention was then turned to the right lower quadrant.  The right lower quadrant was interrogated with ultrasound.  There is a large volume of sonographically simple-appearing ascites.  An appropriate skin entry site on the lateral right abdominal wall was selected and marked.  Local anesthesia was achieved with infiltration of 1% lidocaine.  A small skin nick was made with an 11 blade scalpel. Under direct sonographic guidance, a 5-French Yueh centesis catheter was advanced to the skin and soft tissues and the peritoneal lining was penetrated.  The tip of the needle was confirmed with and a large volume of ascites, far from bowel or other critical structures.  The needle was then removed and a short Amplatz wire advanced through the UV centesis catheter.  The centesis catheter was then exchanged for an 8-French pigtail drainage catheter.  The pigtail was formed and the catheter connected to low wall suction.  Approximately 10,000 ml of clear yellow ascitic fluid was then successfully aspirated without complication.  No evidence of bleeding during the procedure.  While flow through the catheter ceased, the abdomen was again interrogated with ultrasound.  No evidence of residual fluid in the left or right lower quadrant.  Therefore, suction was removed, the probe cut from the pigtail catheter and a pigtail catheter removed with gentle traction.  A sterile bandage was applied.  There is no immediate complication.  The patient tolerated the procedure well and was returned to the intensive care unit in unchanged condition.  IMPRESSION:  1.  Successful placement of a right IJ approach 19 cm  tip-to-cuff tunneled hemodialysis catheter. The catheter tip is positioned at the superior cavoatrial junction/high right atrium and is ready for immediate use.  2.  Successful ultrasound guided large volume paracentesis.  10 liters of clear yellow ascitic fluid was aspirated without evidence of immediate complication.  A sample of fluid was sent to the lab for further analysis.  25 mg of 25% albumin was ordered for on chronic replacement.  Signed,  Sterling Big, MD Vascular & Interventional Radiologist [facility]iology   Original Report Authenticated By: Vilma Prader      Medications:      . sodium chloride Stopped (11/29/11 1317)  . dextrose 50 mL/hr at 11/29/11 2000  . feeding supplement (NEPRO CARB STEADY)    . fentaNYL infusion INTRAVENOUS 100 mcg/hr (11/29/11 2000)  . dialysis replacement fluid (prismasate) 400 mL/hr at 11/30/11 0602  . dialysis replacement fluid (prismasate) 200 mL/hr at 11/30/11 0553  . dialysate (PRISMASATE) 1,500 mL/hr at 11/30/11 0810  . DISCONTD: DOPamine Stopped (11/29/11 0252)  . DISCONTD: norepinephrine (LEVOPHED) Adult infusion Stopped (11/29/11 0444)  . DISCONTD: vasopressin (PITRESSIN) infusion - *FOR SHOCK* Stopped (11/29/11 0419)      . antiseptic oral rinse  15 mL Mouth Rinse QID  . chlorhexidine  15 mL Mouth Rinse BID  . darbepoetin  60 mcg Subcutaneous Q Wed-1800  . dextrose      . dextrose      . levetiracetam  250 mg Intravenous Q12H  . pantoprazole (PROTONIX) IV  40 mg Intravenous Q24H  . DISCONTD: ferrous sulfate  325 mg Oral TID WC  . DISCONTD: insulin aspart  0-15 Units Subcutaneous Q4H  . DISCONTD: levetiracetam  250 mg Intravenous Q12H  . DISCONTD: levETIRAcetam  250 mg Oral BID  . DISCONTD: piperacillin-tazobactam  3.375 g Intravenous Q6H  . DISCONTD: vancomycin  750 mg Intravenous Q24H   acetaminophen, diphenhydrAMINE, fentaNYL, heparin,  midazolam, ondansetron (ZOFRAN) IV, ondansetron, sodium chloride, sodium chloride, DISCONTD:  acetaminophen, DISCONTD: acetaminophen, DISCONTD: diphenhydrAMINE, DISCONTD: midazolam  Assessment/ Plan:   1.Acute oliguric renal failure on background of Chronic renal failure - On CVVHD, less volume overloaded than yesterday and will slow the CVVH to 50-100 cc/hr to allow the kidneys to resume some of their normal function. Electrolytes okay today.  2. HTN - Having hypotension before but has tolerated fluid removal well with BP in 100s/60s. Will follow.  3. Ventilator dependent respiratory failure - Still intubated, wake up she follows basic commands and per PCCM extubate when able.   4. Anoxic Brain Injury following PEA arrest - Stable, chronic.  5. Tense ascites. re accumulation after volume removal - Likely this tamponaded the bleeding and non-painful today. However will follow. Hg stable today and no signs of re-bleeding.   5. Cardiomyopathy. Repeat 2D Echo ordered  6. MPGN with C3 nephropathy - Will watch Cr but expect jump with this episode. Aranesp continue. Holding losartan.  7. Seizure disorder - Keppra IV.   PCCM primary and will follow closely.    LOS: 4 Genella Mech, MD 11/30/2011,10:07 AM

## 2011-11-30 NOTE — Progress Notes (Signed)
No need to continue to follow.  No active surgical problem.  Heather Sims. Gae Bon, MD, FACS [phone] [facility]

## 2011-11-30 NOTE — Progress Notes (Signed)
I have seen and examined this patient and agree with plan as outlined above.  Continue with CVVHD for now.  Decrease UF due to improved edema and hopefully can discontinue CVVHD tomorrow if BP remains stable.  Agree with PCCM not to pursue source of bleed as her hgb has remained stable and likely resolved due to FFP/and tamponade.  Extubate per PCCM and will continue to follow UOP.  She will require AVG/AVF once stable. Lakita Sahlin A,MD 11/30/2011 11:28 AM

## 2011-11-30 NOTE — Progress Notes (Signed)
INITIAL ADULT NUTRITION ASSESSMENT Date: 11/30/2011   Time: 10:47 AM  Reason for Assessment: New TF; VDRF  INTERVENTION:  While receiving CVVHD, recommend change TF to Osmolite 1.5 at 10 ml/h, increase by 10 ml every 4 hours to goal rate of 35 ml/h with Prostat 30 ml BID to provide 1460 kcals, 83 grams protein, 640 ml free water daily.  DOCUMENTATION CODES Per approved criteria  -Not Applicable   ASSESSMENT: Female 21 y.o.  Dx: Mental status changes; hypoxia  Hx:  Past Medical History  Diagnosis Date  . Pneumonia     'walking pneumonia'  . Seizures   . Anemia   . Heart attack   . Cardiomyopathy   . Heart failure   . CHF (congestive heart failure)   . Ileitis   . Chronic kidney disease   . Renal disorder   . MPGN (membranoproliferative glomerulonephritis), type 2   . Hypertension   . Brain injury    Past Surgical History  Procedure Date  . Renal biopsy     Related Meds:  Scheduled Meds:   . antiseptic oral rinse  15 mL Mouth Rinse QID  . chlorhexidine  15 mL Mouth Rinse BID  . darbepoetin  60 mcg Subcutaneous Q Wed-1800  . dextrose      . dextrose      . levetiracetam  250 mg Intravenous Q12H  . pantoprazole (PROTONIX) IV  40 mg Intravenous Q24H  . DISCONTD: ferrous sulfate  325 mg Oral TID WC  . DISCONTD: insulin aspart  0-15 Units Subcutaneous Q4H  . DISCONTD: levetiracetam  250 mg Intravenous Q12H  . DISCONTD: levETIRAcetam  250 mg Oral BID  . DISCONTD: piperacillin-tazobactam  3.375 g Intravenous Q6H  . DISCONTD: vancomycin  750 mg Intravenous Q24H   Continuous Infusions:   . sodium chloride Stopped (11/29/11 1317)  . dextrose 50 mL/hr at 11/29/11 2000  . feeding supplement (NEPRO CARB STEADY) 237 mL (11/30/11 1200)  . fentaNYL infusion INTRAVENOUS 100 mcg/hr (11/29/11 2000)  . dialysis replacement fluid (prismasate) 400 mL/hr at 11/30/11 0602  . dialysis replacement fluid (prismasate) 200 mL/hr at 11/30/11 0553  . dialysate (PRISMASATE) 1,500 mL/hr  at 11/30/11 0810  . DISCONTD: DOPamine Stopped (11/29/11 0252)  . DISCONTD: norepinephrine (LEVOPHED) Adult infusion Stopped (11/29/11 0444)  . DISCONTD: vasopressin (PITRESSIN) infusion - *FOR SHOCK* Stopped (11/29/11 0419)   PRN Meds:.acetaminophen, diphenhydrAMINE, fentaNYL, heparin, midazolam, ondansetron (ZOFRAN) IV, ondansetron, sodium chloride, sodium chloride, DISCONTD: acetaminophen, DISCONTD: acetaminophen, DISCONTD: diphenhydrAMINE, DISCONTD: midazolam   Ht: 5\' 2"  (157.5 cm)  Wt: 160 lb 4.4 oz (72.7 kg)  Ideal Wt: 50 kg % Ideal Wt: 145%  Wt Readings from Last 10 Encounters:  11/30/11 160 lb 4.4 oz (72.7 kg)  11/11/11 120 lb (54.432 kg)  10/16/11 130 lb 8.2 oz (59.2 kg)  09/24/11 140 lb 14.4 oz (63.912 kg)  09/01/11 119 lb (53.978 kg)  08/25/11 117 lb 8.1 oz (53.3 kg)  07/03/11 107 lb (48.535 kg)  07/01/11 107 lb (48.535 kg)  05/27/11 122 lb 5.7 oz (55.5 kg)  05/07/11 127 lb 13.9 oz (58 kg)    Usual Wt: 106-110 lb % Usual Wt: 148%  Body mass index is 29.31 kg/(m^2).  BMI using usual weight of 110 lb is 20.  Food/Nutrition Related Hx: good PO intake PTA per Mom unless she was feeling bad.  Weight stable at 106-110 lb PTA.  Labs:  CMP     Component Value Date/Time   NA 137 11/30/2011 0510  K 3.9 11/30/2011 0510   CL 102 11/30/2011 0510   CO2 27 11/30/2011 0510   GLUCOSE 87 11/30/2011 0510   BUN 22 11/30/2011 0510   CREATININE 1.77* 11/30/2011 0510   CREATININE 2.63* 10/12/2011 1230   CALCIUM 7.9* 11/30/2011 0510   CALCIUM 8.4 10/28/2011 1327   PROT 6.1 11/29/2011 0224   ALBUMIN 1.8* 11/30/2011 0510   AST 56* 11/29/2011 0224   ALT 15 11/29/2011 0224   ALKPHOS 62 11/29/2011 0224   BILITOT 0.4 11/29/2011 0224   GFRNONAA 40* 11/30/2011 0510   GFRAA 46* 11/30/2011 0510    CBG (last 3)   Basename 11/30/11 0803 11/30/11 0348 11/29/11 2354  GLUCAP 68* 70 74    Phosphorus  Date/Time Value Range Status  11/30/2011  5:10 AM 4.1  2.3 - 4.6 mg/dL Final  06/27/9627  5:28 PM 4.2  2.3 - 4.6  mg/dL Final  06/22/3242  0:10 AM 7.9* 2.3 - 4.6 mg/dL Final     Intake/Output Summary (Last 24 hours) at 11/30/11 1108 Last data filed at 11/30/11 1100  Gross per 24 hour  Intake 1764.67 ml  Output   5583 ml  Net -3818.33 ml     Diet Order: NPO  Tube Feeding order: Nepro at 10 ml/h to provide 432 kcals, 19 grams protein, 174 ml free water daily  IVF:    sodium chloride Last Rate: Stopped (11/29/11 1317)  dextrose Last Rate: 50 mL/hr at 11/29/11 2000  feeding supplement (NEPRO CARB STEADY) Last Rate: 237 mL (11/30/11 1200)  fentaNYL infusion INTRAVENOUS Last Rate: 100 mcg/hr (11/29/11 2000)  dialysis replacement fluid (prismasate) Last Rate: 400 mL/hr at 11/30/11 0602  dialysis replacement fluid (prismasate) Last Rate: 200 mL/hr at 11/30/11 0553  dialysate (PRISMASATE) Last Rate: 1,500 mL/hr at 11/30/11 0810  DISCONTD: DOPamine Last Rate: Stopped (11/29/11 0252)  DISCONTD: norepinephrine (LEVOPHED) Adult infusion Last Rate: Stopped (11/29/11 0444)  DISCONTD: vasopressin (PITRESSIN) infusion - *FOR SHOCK* Last Rate: Stopped (11/29/11 0419)    Estimated Nutritional Needs:   Kcal: 1450 Protein: 80-100 grams Fluid: > 1.5 liters  Weight is above usual weight due to fluid overload.  Patient is receiving CVVHD.  Per RN, may be able to extubate patient tomorrow.  TF has been ordered at a low rate, but has not been initiated yet.  OG tube is in place.    Patient is currently intubated on ventilator support.  MV: 6.3 Temp:Temp (24hrs), Avg:97 F (36.1 C), Min:86.4 F (30.2 C), Max:99.7 F (37.6 C)   Serum potassium and phosphorus are WNL.  While receiving CVVHD, patient does not need a renal restricted TF formula that is restricted in potassium and phosphorus.   NUTRITION DIAGNOSIS: -Inadequate oral intake (NI-2.1).  Status: Ongoing  RELATED TO: inability to eat  AS EVIDENCED BY: NPO status  MONITORING/EVALUATION(Goals): Goal:  Intake to meet >90% of estimated nutrition  needs. Monitor:  TF tolerance/adequacy, weight trend, labs, I/O  EDUCATION NEEDS: -Education not appropriate at this time   Joaquin Courts, RD, LDN, CNSC Pager# [phone] After Hours Pager# [phone] 11/30/2011, 10:47 AM

## 2011-11-30 NOTE — Progress Notes (Signed)
Name: Heather Sims MRN: [id number] DOB: [birth year]    LOS: 4  PCCM PROGRESS NOTE  History of Present Illness: The patient is a 21 yo woman, history of CHF (EF 30-35%), CKD (MPGN), presenting 9/5 with encephalopathy, found to have severe ascites, s/p large-volume paracentesis 9/7, and later developed intra-abdominal bleeding, anemia, and hemorrhagic shock, requiring intubation, volume resuscitation, and CVVHD.  Overnight Events:  Patient remains on CVVHD, oliguric yesterday.  BP stable after discontinuing pressors yesterday.  Hemoglobin stable.  Lines / Drains: R Ellsworth Permcath 9/6 >> L IJ TLC 9/8 >> ETT 9/7>> R femoral Aline 9/7>>9/9  Cultures: MRSA PCR 9/6 >> neg Blood culture 9/5 >> NGTD Peritoneal culture 9/7 >> moderate PMN, no organisms Blood culture 9/7 >> NGTD  Antibiotics: Ceftriaxone 9/5 >> 9/5 Cefepime 9/6 >> 9/6 Cefazolin 9/7 >> 9/7 Vanc 9/6 >> 9/7  Tests / Events: 9/6 Head CT: NAD  9/7 US paracentesis >> 10 liters clear yellow fluid removed  9/7 Hemorrhagic shock with intra-abd bleeding. Hgb down to 1.3 (?).  Pressors started (norepi, phenylephrine, vasopressin, dopamine) 9/8 Pressors discontinued, CVVHD started 9/9 Continued CVVHD  Vital Signs: Temp:  [86.4 F (30.2 C)-99.2 F (37.3 C)] 95.9 F (35.5 C) (09/09 1500) Pulse Rate:  [63-108] 108  (09/09 1541) Resp:  [8-26] 20  (09/09 1541) BP: (83-161)/(62-119) 135/95 mmHg (09/09 1541) SpO2:  [90 %-100 %] 94 % (09/09 1541) Arterial Line BP: (97-169)/(64-111) 139/86 mmHg (09/09 1400) FiO2 (%):  [40 %-70 %] 40 % (09/09 1541) Weight:  [160 lb 4.4 oz (72.7 kg)] 160 lb 4.4 oz (72.7 kg) (09/09 0400) I/O last 3 completed shifts: In: 14338.8 [I.V.:5366.3; Blood:4200; NG/GT:180; IV Piggyback:4592.5] Out: 5306 [Urine:194; Emesis/NG output:300; WNUUV:2536; Stool:1]  Physical Examination: General:  Lying in bed, does not appear uncomfortable Neuro:  Sedated but arouseable, nods responses to questions HEENT:   intubated Neck:  No JVD Cardiovascular: RRR Lungs: Mechanical breath sounds, no wheezes Abdomen:  Firm, mildly diffusely tender, distended Musculoskeletal: mild bilateral edema Skin: intact  Ventilator settings: Vent Mode:  [-] PRVC FiO2 (%):  [40 %-70 %] 40 % Set Rate:  [16 bmp-20 bmp] 16 bmp Vt Set:  [350 mL] 350 mL PEEP:  [8 cmH20-10 cmH20] 8 cmH20 Plateau Pressure:  [20 cmH20-29 cmH20] 24 cmH20   Assessment and Plan: The patient is a 21 yo woman, history of CHF and CKD, presenting with ascites, with hospital course complicated by acute blood loss anemia, hypotension, and acute respiratory failure.  # Acute Blood Loss Anemia on Anemia of Chronic Disease -   Lab 11/30/11 0510 11/29/11 1600 11/29/11 1200 11/29/11 0224 11/28/11 2230  HGB 13.6 12.0 12.4 14.6 1.3*  secondary to intra-abdominal bleeding, c/b hemorrhagic shock, with Hb reaching a nadir of 1.3 on 9/7 (?real).  S/p transfusion of 8 pRBC's, 4 platelets, 4 FFP.  Hb currently stable, bleeding appears to have stopped.  Transferrin saturation ratio 52 prior to transfusions. -daily cbc's -continue aranesp  # Acute Respiratory Failure - intubated due to hemorrhagic shock, with respiratory status improving daily -continue full mechanical ventilation -pcxr in AM -daily SBT, may be able to extubate tomorrow  # Ascites - the patient presented with tense ascites, with 10 L removed by paracenetsis, though now with tense abdomen likely representing reaccumulation of fluid.  Ascites is likely secondary to hypoalbuminemia secondary to severe malnutrition.  No known history of cirrhosis, LFT's normal (except low albumin). -starting tube feeds today for malnutrition -continue to monitor albumin  # Acute on Chronic Kidney  Disease - Patient with AKI secondary to ATN from hemorrhagic shock, with a known history of MPGN.  Patient currently oliguric. -appreciate renal consult -continue CVVHD  # Hypotension - Resolved. secondary to  hemorrhagic shock (now improved) vs hypovolemia (given ascites, s/p large volume paracentesis).  Patient also has a history of CHF, with EF 30-35%.  Patient on pressors 9/7-9/8. -continue to monitor CVP  # Hypothermia - likely secondary to anemia -bear hugger in place  # History of seizures -continue keppra  Best practices / Disposition: -->ICU status under PCCM -->full code -->Heparin for DVT Px -->Protonix for GI Px -->ventilator bundle -->Tube feeds - started nepro today -->family updated at bedside  Signed, Janalyn Harder, MD PGY-2, Internal Medicine Pgr: [phone] 11/30/2011, 5:11 PM   Seen on CCM rounds this morning with resident MD or ACNP above.  Pt examined and database reviewed. I agree with above findings, assessment and plan as reflected in the note above. 40 mins CCM time  Billy Fischer, MD;  PCCM service; Mobile [phone]

## 2011-11-30 NOTE — Progress Notes (Signed)
Subjective: Rt IJ perm cath placed 9/7 10 liter paracentesis 9/7 Post procedure hypovolemic and retro bleed Pt stable; h/h stable Hypothermic 95.6   Objective: Vital signs in last 24 hours: Temp:  [86.4 F (30.2 C)-99.7 F (37.6 C)] 95.6 F (35.3 C) (09/09 1100) Pulse Rate:  [63-98] 98  (09/09 1100) Resp:  [8-26] 15  (09/09 1100) BP: (83-161)/(64-119) 119/77 mmHg (09/09 1100) SpO2:  [90 %-100 %] 99 % (09/09 1100) Arterial Line BP: (97-182)/(64-111) 142/84 mmHg (09/09 1100) FiO2 (%):  [40 %-80 %] 40 % (09/09 1023) Weight:  [160 lb 4.4 oz (72.7 kg)] 160 lb 4.4 oz (72.7 kg) (09/09 0400) Last BM Date: 11/29/11  Intake/Output from previous day: 09/08 0701 - 09/09 0700 In: 1442.7 [I.V.:1210.2; NG/GT:120; IV Piggyback:112.5] Out: 5090 [Urine:90; Emesis/NG output:300; Stool:1] Intake/Output this shift: Total I/O In: 432 [I.V.:300; NG/GT:30; IV Piggyback:102] Out: 623 [Urine:15; Other:608]  PE:  On dialysis Hypothermic; BP stable H/h stable abd distended   Lab Results:   Basename 11/30/11 0510 11/29/11 1600  WBC 14.2* 6.7  HGB 13.6 12.0  HCT 39.1 34.4*  PLT 41* 36*   BMET  Basename 11/30/11 0510 11/29/11 1610  NA 137 140  K 3.9 3.2*  CL 102 104  CO2 27 27  GLUCOSE 87 201*  BUN 22 29*  CREATININE 1.77* 2.02*  CALCIUM 7.9* 7.1*   PT/INR  Basename 11/29/11 1200 11/29/11 0250  LABPROT 19.2* 16.3*  INR 1.58* 1.29   ABG  Basename 11/29/11 0656 11/29/11 0406  PHART 7.458* 7.353  HCO3 24.5* 24.0    Studies/Results: Ir Fluoro Guide Cv Line Right  11/29/2011  *RADIOLOGY REPORT*  IR PLACEMENT OF A TUNNELED HEMODIALYSIS CATHETER UNDER FLUOROSCOPIC AND ULTRASOUND GUIDANCE; IR ULTRASOUND PARACENTESIS  Date: 11/28/2011  Clinical History: 21 year old female with a complex medical history including anoxic brain injury, nephritis, acute on chronic renal failure.  Additionally, she has massive abdominal ascites.  She requires central venous access for hemodialysis.  A  paracentesis has also been requested for both diagnostic, and therapeutic purposes.  Procedures Performed: 1. Ultrasound-guided puncture of right internal jugular vein 2.  Placement of a tunneled hemodialysis catheter under fluoroscopic guidance 3.  Ultrasound-guided large volume paracentesis  Interventional Radiologist:  Sterling Big, MD  Sedation: Moderate (conscious) sedation was used.  0.5 mg Versed, 25 mcg Fentanyl were administered intravenously.  The patient's vital signs were monitored continuously by radiology nursing throughout the procedure.  Sedation Time: 99 minutes  Fluoroscopy time: 0.5 minutes  PROCEDURE/FINDINGS:   Informed consent was obtained from the patient's mother following explanation of the procedure, risks, benefits and alternatives. She understands, agrees and consents for the procedure.  All questions were addressed. A time out was performed.  Maximal barrier sterile technique utilized including caps, mask, sterile gowns, sterile gloves, large sterile drape, hand hygiene, and betadine skin prep.  The right neck was interrogated with ultrasound.  The right IJ was found to be widely patent.  Local anesthesia was achieved with infiltration of 1% lidocaine. A small dermatotomy is made with an 11 blade.  Under direct sonographic guidance, the vessel was punctured with a 21-gauge micropuncture needle and a 5-French transitional sheath advanced into the vein.  Measurements to the mid right atrium were then performed with a J-wire.  A 19 cm tip to cuff HemoSplit hemodialysis catheter was selected.  An appropriate skin entry site was selected several centimeters below the clavicle.  Local anesthesia was again achieved with infiltration of 1% lidocaine.  A small incision  was made at the skin exit site. The hemodialysis catheter was then tunneled from the skin exit site under the skin, over the clavicle and out to the dermatotomy overlying the venous access site.  The transitional sheath was  then exchanged for a peel-away sheath.  The catheter was advanced through the peel-away sheath and the tip positioned at the superior cavoatrial junction/high right atrium under fluoroscopic guidance.  The catheter aspirates and flushes well.  The catheter was then flushed, and locked with 1000 unit/ml heparinized saline.  The catheter was secured to the skin with 2-0 Prolene suture. The dermatotomy overlying the venous access site was closed with Dermabond.  A sterile bandage was placed.  Attention was then turned to the right lower quadrant.  The right lower quadrant was interrogated with ultrasound.  There is a large volume of sonographically simple-appearing ascites.  An appropriate skin entry site on the lateral right abdominal wall was selected and marked.  Local anesthesia was achieved with infiltration of 1% lidocaine.  A small skin nick was made with an 11 blade scalpel. Under direct sonographic guidance, a 5-French Yueh centesis catheter was advanced to the skin and soft tissues and the peritoneal lining was penetrated.  The tip of the needle was confirmed with and a large volume of ascites, far from bowel or other critical structures.  The needle was then removed and a short Amplatz wire advanced through the UV centesis catheter.  The centesis catheter was then exchanged for an 8-French pigtail drainage catheter.  The pigtail was formed and the catheter connected to low wall suction.  Approximately 10,000 ml of clear yellow ascitic fluid was then successfully aspirated without complication.  No evidence of bleeding during the procedure.  While flow through the catheter ceased, the abdomen was again interrogated with ultrasound.  No evidence of residual fluid in the left or right lower quadrant.  Therefore, suction was removed, the probe cut from the pigtail catheter and a pigtail catheter removed with gentle traction.  A sterile bandage was applied.  There is no immediate complication.  The patient  tolerated the procedure well and was returned to the intensive care unit in unchanged condition.  IMPRESSION:  1.  Successful placement of a right IJ approach 19 cm tip-to-cuff tunneled hemodialysis catheter. The catheter tip is positioned at the superior cavoatrial junction/high right atrium and is ready for immediate use.  2.  Successful ultrasound guided large volume paracentesis.  10 liters of clear yellow ascitic fluid was aspirated without evidence of immediate complication.  A sample of fluid was sent to the lab for further analysis.  25 mg of 25% albumin was ordered for on chronic replacement.  Signed,  Sterling Big, MD Vascular & Interventional Radiologist [facility] Radiology   Original Report Authenticated By: Vilma Prader    Ir US Guide Vasc Access Right  11/29/2011  *RADIOLOGY REPORT*  IR PLACEMENT OF A TUNNELED HEMODIALYSIS CATHETER UNDER FLUOROSCOPIC AND ULTRASOUND GUIDANCE; IR ULTRASOUND PARACENTESIS  Date: 11/28/2011  Clinical History: 21 year old female with a complex medical history including anoxic brain injury, nephritis, acute on chronic renal failure.  Additionally, she has massive abdominal ascites.  She requires central venous access for hemodialysis.  A paracentesis has also been requested for both diagnostic, and therapeutic purposes.  Procedures Performed: 1. Ultrasound-guided puncture of right internal jugular vein 2.  Placement of a tunneled hemodialysis catheter under fluoroscopic guidance 3.  Ultrasound-guided large volume paracentesis  Interventional Radiologist:  Sterling Big, MD  Sedation: Moderate (conscious)  sedation was used.  0.5 mg Versed, 25 mcg Fentanyl were administered intravenously.  The patient's vital signs were monitored continuously by radiology nursing throughout the procedure.  Sedation Time: 99 minutes  Fluoroscopy time: 0.5 minutes  PROCEDURE/FINDINGS:   Informed consent was obtained from the patient's mother following explanation of the procedure, risks,  benefits and alternatives. She understands, agrees and consents for the procedure.  All questions were addressed. A time out was performed.  Maximal barrier sterile technique utilized including caps, mask, sterile gowns, sterile gloves, large sterile drape, hand hygiene, and betadine skin prep.  The right neck was interrogated with ultrasound.  The right IJ was found to be widely patent.  Local anesthesia was achieved with infiltration of 1% lidocaine. A small dermatotomy is made with an 11 blade.  Under direct sonographic guidance, the vessel was punctured with a 21-gauge micropuncture needle and a 5-French transitional sheath advanced into the vein.  Measurements to the mid right atrium were then performed with a J-wire.  A 19 cm tip to cuff HemoSplit hemodialysis catheter was selected.  An appropriate skin entry site was selected several centimeters below the clavicle.  Local anesthesia was again achieved with infiltration of 1% lidocaine.  A small incision was made at the skin exit site. The hemodialysis catheter was then tunneled from the skin exit site under the skin, over the clavicle and out to the dermatotomy overlying the venous access site.  The transitional sheath was then exchanged for a peel-away sheath.  The catheter was advanced through the peel-away sheath and the tip positioned at the superior cavoatrial junction/high right atrium under fluoroscopic guidance.  The catheter aspirates and flushes well.  The catheter was then flushed, and locked with 1000 unit/ml heparinized saline.  The catheter was secured to the skin with 2-0 Prolene suture. The dermatotomy overlying the venous access site was closed with Dermabond.  A sterile bandage was placed.  Attention was then turned to the right lower quadrant.  The right lower quadrant was interrogated with ultrasound.  There is a large volume of sonographically simple-appearing ascites.  An appropriate skin entry site on the lateral right abdominal wall  was selected and marked.  Local anesthesia was achieved with infiltration of 1% lidocaine.  A small skin nick was made with an 11 blade scalpel. Under direct sonographic guidance, a 5-French Yueh centesis catheter was advanced to the skin and soft tissues and the peritoneal lining was penetrated.  The tip of the needle was confirmed with and a large volume of ascites, far from bowel or other critical structures.  The needle was then removed and a short Amplatz wire advanced through the UV centesis catheter.  The centesis catheter was then exchanged for an 8-French pigtail drainage catheter.  The pigtail was formed and the catheter connected to low wall suction.  Approximately 10,000 ml of clear yellow ascitic fluid was then successfully aspirated without complication.  No evidence of bleeding during the procedure.  While flow through the catheter ceased, the abdomen was again interrogated with ultrasound.  No evidence of residual fluid in the left or right lower quadrant.  Therefore, suction was removed, the probe cut from the pigtail catheter and a pigtail catheter removed with gentle traction.  A sterile bandage was applied.  There is no immediate complication.  The patient tolerated the procedure well and was returned to the intensive care unit in unchanged condition.  IMPRESSION:  1.  Successful placement of a right IJ approach 19 cm tip-to-cuff  tunneled hemodialysis catheter. The catheter tip is positioned at the superior cavoatrial junction/high right atrium and is ready for immediate use.  2.  Successful ultrasound guided large volume paracentesis.  10 liters of clear yellow ascitic fluid was aspirated without evidence of immediate complication.  A sample of fluid was sent to the lab for further analysis.  25 mg of 25% albumin was ordered for on chronic replacement.  Signed,  Sterling Big, MD Vascular & Interventional Radiologist [facility] Radiology   Original Report Authenticated By: Alvino Blood  Chest Port 1 View  11/30/2011  *RADIOLOGY REPORT*  Clinical Data: Follow-up respiratory failure.  Ventilator support.  PORTABLE CHEST - 1 VIEW  Comparison: 98-1013  Findings: Endotracheal tube has its tip 1 cm above the carina. Nasogastric tube enters the abdomen.  Right internal jugular central line has both tips in the SVC.  There is persistent pulmonary volume loss at the bases, most advanced in the left lower lobe.  Little change of the last several films.  No developing finding.  IMPRESSION: Persistent volume loss in the lower lungs, particularly in the left lower lobe.  Lines and tubes well positioned.   Original Report Authenticated By: Thomasenia Sales, M.D.    Dg Chest Port 1 View  11/29/2011  *RADIOLOGY REPORT*  Clinical Data: CVP placement.  PORTABLE CHEST - 1 VIEW  Comparison: 11/28/2011  Findings: Since the previous study, the left subclavian catheter has been removed.  The left internal jugular catheter has been pulled back.  The tip is now projected over the mid mediastinum consistent with location in the confluence of the brachycephalic vein and superior vena cava.  Right central venous catheter, endotracheal tube, and enteric tubes are unchanged in position. Again, there is shallow inspiration with cardiac enlargement and pulmonary vascular congestion.  Atelectasis or consolidation in the lung bases.  IMPRESSION: Left internal jugular venous catheter tip is projected over the confluence of the brachial cephalic vein and SVC.  Appliances are otherwise stable.  Persistent cardiac enlargement with pulmonary vascular congestion.  Basilar atelectasis or consolidation.   Original Report Authenticated By: Marlon Pel, M.D.    Dg Chest Portable 1 View  11/29/2011  *RADIOLOGY REPORT*  Clinical Data:  PORTABLE CHEST - 1 VIEW  Comparison: 11/28/2011 at 2210 hours  Findings: Endotracheal tube tip measures 1.4 cm above the carina. Enteric tube tip is not visualized but is below the left hemidiaphragm  consistent with location at least in the stomach. There appear to be two left central venous catheters, one via internal jugular and one via subclavian approach.  The catheter tips are both located across the midline in the right axillary vein region.  No pneumothorax.  Cardiac enlargement with prominent pulmonary vascularity.  Perihilar airspace disease and air bronchograms may represent edema or pneumonia.  IMPRESSION: Left central venous catheter tips are projected over the right axillary vein and should be pulled back.  Otherwise stable appearance of the chest.   Original Report Authenticated By: Marlon Pel, M.D.    Dg Chest Port 1 View  11/28/2011  *RADIOLOGY REPORT*  Clinical Data: Repositioning of endotracheal tube. Ventilator dependent respiratory failure.  Acute renal failure.  PORTABLE CHEST - 1 VIEW  Comparison: Prior today  Findings: The endotracheal tube has now been pulled back with the tip approximately 2 cm above the trachea.  Right jugular dual lumen center venous dialysis catheter and nasogastric remain in appropriate position.  Worsening consolidation or collapse is seen involving the  retrocardiac left lung.  Right basilar atelectasis is stable.  IMPRESSION:  1.  Endotracheal tube tip now approximately 2 cm above carina. 2.  Increased left retrocardiac consolidation versus collapse. 3.  Stable mild right basilar atelectasis.   Original Report Authenticated By: Danae Orleans, M.D.    Dg Chest Port 1 View  11/28/2011  *RADIOLOGY REPORT*  Clinical Data: Status post endotracheal tube placement  PORTABLE CHEST - 1 VIEW  Comparison: 11/27/2011  Findings: There is been interval placement of an endotracheal tube. It is directed into the right mainstem bronchus and should be withdrawn 1-2 cm. Decreased aeration of the left lung base is again noted.  A nasogastric catheter is noted within the stomach.  A dialysis catheter is seen and stable.  IMPRESSION: Persistent changes in the left lung base.   Placement of endotracheal tube in to the right mainstem bronchus. This should be withdrawn 1-2 cm.  Nasogastric catheter the stomach.   Original Report Authenticated By: Phillips Odor, M.D.    Ir Paracentesis  11/29/2011  *RADIOLOGY REPORT*  IR PLACEMENT OF A TUNNELED HEMODIALYSIS CATHETER UNDER FLUOROSCOPIC AND ULTRASOUND GUIDANCE; IR ULTRASOUND PARACENTESIS  Date: 11/28/2011  Clinical History: 21 year old female with a complex medical history including anoxic brain injury, nephritis, acute on chronic renal failure.  Additionally, she has massive abdominal ascites.  She requires central venous access for hemodialysis.  A paracentesis has also been requested for both diagnostic, and therapeutic purposes.  Procedures Performed: 1. Ultrasound-guided puncture of right internal jugular vein 2.  Placement of a tunneled hemodialysis catheter under fluoroscopic guidance 3.  Ultrasound-guided large volume paracentesis  Interventional Radiologist:  Sterling Big, MD  Sedation: Moderate (conscious) sedation was used.  0.5 mg Versed, 25 mcg Fentanyl were administered intravenously.  The patient's vital signs were monitored continuously by radiology nursing throughout the procedure.  Sedation Time: 99 minutes  Fluoroscopy time: 0.5 minutes  PROCEDURE/FINDINGS:   Informed consent was obtained from the patient's mother following explanation of the procedure, risks, benefits and alternatives. She understands, agrees and consents for the procedure.  All questions were addressed. A time out was performed.  Maximal barrier sterile technique utilized including caps, mask, sterile gowns, sterile gloves, large sterile drape, hand hygiene, and betadine skin prep.  The right neck was interrogated with ultrasound.  The right IJ was found to be widely patent.  Local anesthesia was achieved with infiltration of 1% lidocaine. A small dermatotomy is made with an 11 blade.  Under direct sonographic guidance, the vessel was punctured  with a 21-gauge micropuncture needle and a 5-French transitional sheath advanced into the vein.  Measurements to the mid right atrium were then performed with a J-wire.  A 19 cm tip to cuff HemoSplit hemodialysis catheter was selected.  An appropriate skin entry site was selected several centimeters below the clavicle.  Local anesthesia was again achieved with infiltration of 1% lidocaine.  A small incision was made at the skin exit site. The hemodialysis catheter was then tunneled from the skin exit site under the skin, over the clavicle and out to the dermatotomy overlying the venous access site.  The transitional sheath was then exchanged for a peel-away sheath.  The catheter was advanced through the peel-away sheath and the tip positioned at the superior cavoatrial junction/high right atrium under fluoroscopic guidance.  The catheter aspirates and flushes well.  The catheter was then flushed, and locked with 1000 unit/ml heparinized saline.  The catheter was secured to the skin with 2-0 Prolene  suture. The dermatotomy overlying the venous access site was closed with Dermabond.  A sterile bandage was placed.  Attention was then turned to the right lower quadrant.  The right lower quadrant was interrogated with ultrasound.  There is a large volume of sonographically simple-appearing ascites.  An appropriate skin entry site on the lateral right abdominal wall was selected and marked.  Local anesthesia was achieved with infiltration of 1% lidocaine.  A small skin nick was made with an 11 blade scalpel. Under direct sonographic guidance, a 5-French Yueh centesis catheter was advanced to the skin and soft tissues and the peritoneal lining was penetrated.  The tip of the needle was confirmed with and a large volume of ascites, far from bowel or other critical structures.  The needle was then removed and a short Amplatz wire advanced through the UV centesis catheter.  The centesis catheter was then exchanged for an  8-French pigtail drainage catheter.  The pigtail was formed and the catheter connected to low wall suction.  Approximately 10,000 ml of clear yellow ascitic fluid was then successfully aspirated without complication.  No evidence of bleeding during the procedure.  While flow through the catheter ceased, the abdomen was again interrogated with ultrasound.  No evidence of residual fluid in the left or right lower quadrant.  Therefore, suction was removed, the probe cut from the pigtail catheter and a pigtail catheter removed with gentle traction.  A sterile bandage was applied.  There is no immediate complication.  The patient tolerated the procedure well and was returned to the intensive care unit in unchanged condition.  IMPRESSION:  1.  Successful placement of a right IJ approach 19 cm tip-to-cuff tunneled hemodialysis catheter. The catheter tip is positioned at the superior cavoatrial junction/high right atrium and is ready for immediate use.  2.  Successful ultrasound guided large volume paracentesis.  10 liters of clear yellow ascitic fluid was aspirated without evidence of immediate complication.  A sample of fluid was sent to the lab for further analysis.  25 mg of 25% albumin was ordered for on chronic replacement.  Signed,  Sterling Big, MD Vascular & Interventional Radiologist [facility] Radiology   Original Report Authenticated By: Vilma Prader     Anti-infectives:   Assessment/Plan: s/p Rt IJ permcath placed 9/7 US guided para 9/7  Post retro bleed Hemo stable now PC in use  Carlen Fils A 11/30/2011

## 2011-11-30 NOTE — Progress Notes (Signed)
Patient ID: Heather Sims, female   DOB: [birth year], 21 y.o.   MRN: [id number]    Subjective: Remains intubated , on CVVH, awake. Mother at bedside.  Objective: Vital signs in last 24 hours: Temp:  [86.4 F (30.2 C)-99.7 F (37.6 C)] 97.5 F (36.4 C) (09/09 0750) Pulse Rate:  [63-98] 92  (09/09 0750) Resp:  [8-28] 19  (09/09 0750) BP: (83-161)/(64-119) 111/80 mmHg (09/09 0750) SpO2:  [90 %-100 %] 98 % (09/09 0750) Arterial Line BP: (97-182)/(64-111) 103/64 mmHg (09/09 0700) FiO2 (%):  [50 %-90 %] 50 % (09/09 0750) Weight:  [160 lb 4.4 oz (72.7 kg)] 160 lb 4.4 oz (72.7 kg) (09/09 0400) Last BM Date: 11/29/11  Intake/Output from previous day: 09/08 0701 - 09/09 0700 In: 1442.7 [I.V.:1210.2; NG/GT:120; IV Piggyback:112.5] Out: 5090 [Urine:90; Emesis/NG output:300; Stool:1] Intake/Output this shift: Total I/O In: -  Out: 213 [Other:213]  General appearance: Eyes open to voice, remains on vent, on CVVH. Chest:coarse bilaterally  Cardiac: RRR, no M/R/G Hr 90's   Abdomen: distended, firm, no BS,  No ecchymosis.  Maintaining BP off pressors BP; 111/80 currently On CVVH now at 150-200 pull/hr Temp 97.5 (on bear hugger) WBC trending up, BUN and creat continue to slowly improve.   Lab Results:   Basename 11/30/11 0510 11/29/11 1600  WBC 14.2* 6.7  HGB 13.6 12.0  HCT 39.1 34.4*  PLT 41* 36*   BMET  Basename 11/30/11 0510 11/29/11 1610  NA 137 140  K 3.9 3.2*  CL 102 104  CO2 27 27  GLUCOSE 87 201*  BUN 22 29*  CREATININE 1.77* 2.02*  CALCIUM 7.9* 7.1*   PT/INR  Basename 11/29/11 1200 11/29/11 0250  LABPROT 19.2* 16.3*  INR 1.58* 1.29   ABG  Basename 11/29/11 0656 11/29/11 0406  PHART 7.458* 7.353  HCO3 24.5* 24.0    Studies/Results: Ir Fluoro Guide Cv Line Right  11/29/2011  *RADIOLOGY REPORT*  IR PLACEMENT OF A TUNNELED HEMODIALYSIS CATHETER UNDER FLUOROSCOPIC AND ULTRASOUND GUIDANCE; IR ULTRASOUND PARACENTESIS  Date: 11/28/2011  Clinical History:  21 year old female with a complex medical history including anoxic brain injury, nephritis, acute on chronic renal failure.  Additionally, she has massive abdominal ascites.  She requires central venous access for hemodialysis.  A paracentesis has also been requested for both diagnostic, and therapeutic purposes.  Procedures Performed: 1. Ultrasound-guided puncture of right internal jugular vein 2.  Placement of a tunneled hemodialysis catheter under fluoroscopic guidance 3.  Ultrasound-guided large volume paracentesis  Interventional Radiologist:  Sterling Big, MD  Sedation: Moderate (conscious) sedation was used.  0.5 mg Versed, 25 mcg Fentanyl were administered intravenously.  The patient's vital signs were monitored continuously by radiology nursing throughout the procedure.  Sedation Time: 99 minutes  Fluoroscopy time: 0.5 minutes  PROCEDURE/FINDINGS:   Informed consent was obtained from the patient's mother following explanation of the procedure, risks, benefits and alternatives. She understands, agrees and consents for the procedure.  All questions were addressed. A time out was performed.  Maximal barrier sterile technique utilized including caps, mask, sterile gowns, sterile gloves, large sterile drape, hand hygiene, and betadine skin prep.  The right neck was interrogated with ultrasound.  The right IJ was found to be widely patent.  Local anesthesia was achieved with infiltration of 1% lidocaine. A small dermatotomy is made with an 11 blade.  Under direct sonographic guidance, the vessel was punctured with a 21-gauge micropuncture needle and a 5-French transitional sheath advanced into the vein.  Measurements to the  mid right atrium were then performed with a J-wire.  A 19 cm tip to cuff HemoSplit hemodialysis catheter was selected.  An appropriate skin entry site was selected several centimeters below the clavicle.  Local anesthesia was again achieved with infiltration of 1% lidocaine.  A small  incision was made at the skin exit site. The hemodialysis catheter was then tunneled from the skin exit site under the skin, over the clavicle and out to the dermatotomy overlying the venous access site.  The transitional sheath was then exchanged for a peel-away sheath.  The catheter was advanced through the peel-away sheath and the tip positioned at the superior cavoatrial junction/high right atrium under fluoroscopic guidance.  The catheter aspirates and flushes well.  The catheter was then flushed, and locked with 1000 unit/ml heparinized saline.  The catheter was secured to the skin with 2-0 Prolene suture. The dermatotomy overlying the venous access site was closed with Dermabond.  A sterile bandage was placed.  Attention was then turned to the right lower quadrant.  The right lower quadrant was interrogated with ultrasound.  There is a large volume of sonographically simple-appearing ascites.  An appropriate skin entry site on the lateral right abdominal wall was selected and marked.  Local anesthesia was achieved with infiltration of 1% lidocaine.  A small skin nick was made with an 11 blade scalpel. Under direct sonographic guidance, a 5-French Yueh centesis catheter was advanced to the skin and soft tissues and the peritoneal lining was penetrated.  The tip of the needle was confirmed with and a large volume of ascites, far from bowel or other critical structures.  The needle was then removed and a short Amplatz wire advanced through the UV centesis catheter.  The centesis catheter was then exchanged for an 8-French pigtail drainage catheter.  The pigtail was formed and the catheter connected to low wall suction.  Approximately 10,000 ml of clear yellow ascitic fluid was then successfully aspirated without complication.  No evidence of bleeding during the procedure.  While flow through the catheter ceased, the abdomen was again interrogated with ultrasound.  No evidence of residual fluid in the left or  right lower quadrant.  Therefore, suction was removed, the probe cut from the pigtail catheter and a pigtail catheter removed with gentle traction.  A sterile bandage was applied.  There is no immediate complication.  The patient tolerated the procedure well and was returned to the intensive care unit in unchanged condition.  IMPRESSION:  1.  Successful placement of a right IJ approach 19 cm tip-to-cuff tunneled hemodialysis catheter. The catheter tip is positioned at the superior cavoatrial junction/high right atrium and is ready for immediate use.  2.  Successful ultrasound guided large volume paracentesis.  10 liters of clear yellow ascitic fluid was aspirated without evidence of immediate complication.  A sample of fluid was sent to the lab for further analysis.  25 mg of 25% albumin was ordered for on chronic replacement.  Signed,  Sterling Big, MD Vascular & Interventional Radiologist [facility]   Original Report Authenticated By: Vilma Prader    Ir US Guide Vasc Access Right  11/29/2011  *RADIOLOGY REPORT*  IR PLACEMENT OF A TUNNELED HEMODIALYSIS CATHETER UNDER FLUOROSCOPIC AND ULTRASOUND GUIDANCE; IR ULTRASOUND PARACENTESIS  Date: 11/28/2011  Clinical History: 21 year old female with a complex medical history including anoxic brain injury, nephritis, acute on chronic renal failure.  Additionally, she has massive abdominal ascites.  She requires central venous access for hemodialysis.  A paracentesis has  also been requested for both diagnostic, and therapeutic purposes.  Procedures Performed: 1. Ultrasound-guided puncture of right internal jugular vein 2.  Placement of a tunneled hemodialysis catheter under fluoroscopic guidance 3.  Ultrasound-guided large volume paracentesis  Interventional Radiologist:  Sterling Big, MD  Sedation: Moderate (conscious) sedation was used.  0.5 mg Versed, 25 mcg Fentanyl were administered intravenously.  The patient's vital signs were monitored continuously  by radiology nursing throughout the procedure.  Sedation Time: 99 minutes  Fluoroscopy time: 0.5 minutes  PROCEDURE/FINDINGS:   Informed consent was obtained from the patient's mother following explanation of the procedure, risks, benefits and alternatives. She understands, agrees and consents for the procedure.  All questions were addressed. A time out was performed.  Maximal barrier sterile technique utilized including caps, mask, sterile gowns, sterile gloves, large sterile drape, hand hygiene, and betadine skin prep.  The right neck was interrogated with ultrasound.  The right IJ was found to be widely patent.  Local anesthesia was achieved with infiltration of 1% lidocaine. A small dermatotomy is made with an 11 blade.  Under direct sonographic guidance, the vessel was punctured with a 21-gauge micropuncture needle and a 5-French transitional sheath advanced into the vein.  Measurements to the mid right atrium were then performed with a J-wire.  A 19 cm tip to cuff HemoSplit hemodialysis catheter was selected.  An appropriate skin entry site was selected several centimeters below the clavicle.  Local anesthesia was again achieved with infiltration of 1% lidocaine.  A small incision was made at the skin exit site. The hemodialysis catheter was then tunneled from the skin exit site under the skin, over the clavicle and out to the dermatotomy overlying the venous access site.  The transitional sheath was then exchanged for a peel-away sheath.  The catheter was advanced through the peel-away sheath and the tip positioned at the superior cavoatrial junction/high right atrium under fluoroscopic guidance.  The catheter aspirates and flushes well.  The catheter was then flushed, and locked with 1000 unit/ml heparinized saline.  The catheter was secured to the skin with 2-0 Prolene suture. The dermatotomy overlying the venous access site was closed with Dermabond.  A sterile bandage was placed.  Attention was then  turned to the right lower quadrant.  The right lower quadrant was interrogated with ultrasound.  There is a large volume of sonographically simple-appearing ascites.  An appropriate skin entry site on the lateral right abdominal wall was selected and marked.  Local anesthesia was achieved with infiltration of 1% lidocaine.  A small skin nick was made with an 11 blade scalpel. Under direct sonographic guidance, a 5-French Yueh centesis catheter was advanced to the skin and soft tissues and the peritoneal lining was penetrated.  The tip of the needle was confirmed with and a large volume of ascites, far from bowel or other critical structures.  The needle was then removed and a short Amplatz wire advanced through the UV centesis catheter.  The centesis catheter was then exchanged for an 8-French pigtail drainage catheter.  The pigtail was formed and the catheter connected to low wall suction.  Approximately 10,000 ml of clear yellow ascitic fluid was then successfully aspirated without complication.  No evidence of bleeding during the procedure.  While flow through the catheter ceased, the abdomen was again interrogated with ultrasound.  No evidence of residual fluid in the left or right lower quadrant.  Therefore, suction was removed, the probe cut from the pigtail catheter and a pigtail catheter  removed with gentle traction.  A sterile bandage was applied.  There is no immediate complication.  The patient tolerated the procedure well and was returned to the intensive care unit in unchanged condition.  IMPRESSION:  1.  Successful placement of a right IJ approach 19 cm tip-to-cuff tunneled hemodialysis catheter. The catheter tip is positioned at the superior cavoatrial junction/high right atrium and is ready for immediate use.  2.  Successful ultrasound guided large volume paracentesis.  10 liters of clear yellow ascitic fluid was aspirated without evidence of immediate complication.  A sample of fluid was sent to the  lab for further analysis.  25 mg of 25% albumin was ordered for on chronic replacement.  Signed,  Sterling Big, MD Vascular & Interventional Radiologist [facility]   Original Report Authenticated By: Vilma Prader    Ir US Guide Bx Asp/drain  11/29/2011  *RADIOLOGY REPORT*  IR PLACEMENT OF A TUNNELED HEMODIALYSIS CATHETER UNDER FLUOROSCOPIC AND ULTRASOUND GUIDANCE; IR ULTRASOUND PARACENTESIS  Date: 11/28/2011  Clinical History: 21 year old female with a complex medical history including anoxic brain injury, nephritis, acute on chronic renal failure.  Additionally, she has massive abdominal ascites.  She requires central venous access for hemodialysis.  A paracentesis has also been requested for both diagnostic, and therapeutic purposes.  Procedures Performed: 1. Ultrasound-guided puncture of right internal jugular vein 2.  Placement of a tunneled hemodialysis catheter under fluoroscopic guidance 3.  Ultrasound-guided large volume paracentesis  Interventional Radiologist:  Sterling Big, MD  Sedation: Moderate (conscious) sedation was used.  0.5 mg Versed, 25 mcg Fentanyl were administered intravenously.  The patient's vital signs were monitored continuously by radiology nursing throughout the procedure.  Sedation Time: 99 minutes  Fluoroscopy time: 0.5 minutes  PROCEDURE/FINDINGS:   Informed consent was obtained from the patient's mother following explanation of the procedure, risks, benefits and alternatives. She understands, agrees and consents for the procedure.  All questions were addressed. A time out was performed.  Maximal barrier sterile technique utilized including caps, mask, sterile gowns, sterile gloves, large sterile drape, hand hygiene, and betadine skin prep.  The right neck was interrogated with ultrasound.  The right IJ was found to be widely patent.  Local anesthesia was achieved with infiltration of 1% lidocaine. A small dermatotomy is made with an 11 blade.  Under direct  sonographic guidance, the vessel was punctured with a 21-gauge micropuncture needle and a 5-French transitional sheath advanced into the vein.  Measurements to the mid right atrium were then performed with a J-wire.  A 19 cm tip to cuff HemoSplit hemodialysis catheter was selected.  An appropriate skin entry site was selected several centimeters below the clavicle.  Local anesthesia was again achieved with infiltration of 1% lidocaine.  A small incision was made at the skin exit site. The hemodialysis catheter was then tunneled from the skin exit site under the skin, over the clavicle and out to the dermatotomy overlying the venous access site.  The transitional sheath was then exchanged for a peel-away sheath.  The catheter was advanced through the peel-away sheath and the tip positioned at the superior cavoatrial junction/high right atrium under fluoroscopic guidance.  The catheter aspirates and flushes well.  The catheter was then flushed, and locked with 1000 unit/ml heparinized saline.  The catheter was secured to the skin with 2-0 Prolene suture. The dermatotomy overlying the venous access site was closed with Dermabond.  A sterile bandage was placed.  Attention was then turned to the right lower quadrant.  The right lower quadrant was interrogated with ultrasound.  There is a large volume of sonographically simple-appearing ascites.  An appropriate skin entry site on the lateral right abdominal wall was selected and marked.  Local anesthesia was achieved with infiltration of 1% lidocaine.  A small skin nick was made with an 11 blade scalpel. Under direct sonographic guidance, a 5-French Yueh centesis catheter was advanced to the skin and soft tissues and the peritoneal lining was penetrated.  The tip of the needle was confirmed with and a large volume of ascites, far from bowel or other critical structures.  The needle was then removed and a short Amplatz wire advanced through the UV centesis catheter.  The  centesis catheter was then exchanged for an 8-French pigtail drainage catheter.  The pigtail was formed and the catheter connected to low wall suction.  Approximately 10,000 ml of clear yellow ascitic fluid was then successfully aspirated without complication.  No evidence of bleeding during the procedure.  While flow through the catheter ceased, the abdomen was again interrogated with ultrasound.  No evidence of residual fluid in the left or right lower quadrant.  Therefore, suction was removed, the probe cut from the pigtail catheter and a pigtail catheter removed with gentle traction.  A sterile bandage was applied.  There is no immediate complication.  The patient tolerated the procedure well and was returned to the intensive care unit in unchanged condition.  IMPRESSION:  1.  Successful placement of a right IJ approach 19 cm tip-to-cuff tunneled hemodialysis catheter. The catheter tip is positioned at the superior cavoatrial junction/high right atrium and is ready for immediate use.  2.  Successful ultrasound guided large volume paracentesis.  10 liters of clear yellow ascitic fluid was aspirated without evidence of immediate complication.  A sample of fluid was sent to the lab for further analysis.  25 mg of 25% albumin was ordered for on chronic replacement.  Signed,  Sterling Big, MD Vascular & Interventional Radiologist [facility]   Original Report Authenticated By: Alvino Blood Chest Port 1 View  11/30/2011  *RADIOLOGY REPORT*  Clinical Data: Follow-up respiratory failure.  Ventilator support.  PORTABLE CHEST - 1 VIEW  Comparison: [id number]  Findings: Endotracheal tube has its tip 1 cm above the carina. Nasogastric tube enters the abdomen.  Right internal jugular central line has both tips in the SVC.  There is persistent pulmonary volume loss at the bases, most advanced in the left lower lobe.  Little change of the last several films.  No developing finding.  IMPRESSION: Persistent volume  loss in the lower lungs, particularly in the left lower lobe.  Lines and tubes well positioned.   Original Report Authenticated By: Thomasenia Sales, M.D.    Dg Chest Port 1 View  11/29/2011  *RADIOLOGY REPORT*  Clinical Data: CVP placement.  PORTABLE CHEST - 1 VIEW  Comparison: 11/28/2011  Findings: Since the previous study, the left subclavian catheter has been removed.  The left internal jugular catheter has been pulled back.  The tip is now projected over the mid mediastinum consistent with location in the confluence of the brachycephalic vein and superior vena cava.  Right central venous catheter, endotracheal tube, and enteric tubes are unchanged in position. Again, there is shallow inspiration with cardiac enlargement and pulmonary vascular congestion.  Atelectasis or consolidation in the lung bases.  IMPRESSION: Left internal jugular venous catheter tip is projected over the confluence of the brachial cephalic vein and SVC.  Appliances are otherwise stable.  Persistent cardiac enlargement with pulmonary vascular congestion.  Basilar atelectasis or consolidation.   Original Report Authenticated By: Marlon Pel, M.D.    Dg Chest Portable 1 View  11/29/2011  *RADIOLOGY REPORT*  Clinical Data:  PORTABLE CHEST - 1 VIEW  Comparison: 11/28/2011 at 2210 hours  Findings: Endotracheal tube tip measures 1.4 cm above the carina. Enteric tube tip is not visualized but is below the left hemidiaphragm consistent with location at least in the stomach. There appear to be two left central venous catheters, one via internal jugular and one via subclavian approach.  The catheter tips are both located across the midline in the right axillary vein region.  No pneumothorax.  Cardiac enlargement with prominent pulmonary vascularity.  Perihilar airspace disease and air bronchograms may represent edema or pneumonia.  IMPRESSION: Left central venous catheter tips are projected over the right axillary vein and should be pulled  back.  Otherwise stable appearance of the chest.   Original Report Authenticated By: Marlon Pel, M.D.    Dg Chest Port 1 View  11/28/2011  *RADIOLOGY REPORT*  Clinical Data: Repositioning of endotracheal tube. Ventilator dependent respiratory failure.  Acute renal failure.  PORTABLE CHEST - 1 VIEW  Comparison: Prior today  Findings: The endotracheal tube has now been pulled back with the tip approximately 2 cm above the trachea.  Right jugular dual lumen center venous dialysis catheter and nasogastric remain in appropriate position.  Worsening consolidation or collapse is seen involving the retrocardiac left lung.  Right basilar atelectasis is stable.  IMPRESSION:  1.  Endotracheal tube tip now approximately 2 cm above carina. 2.  Increased left retrocardiac consolidation versus collapse. 3.  Stable mild right basilar atelectasis.   Original Report Authenticated By: Danae Orleans, M.D.    Dg Chest Port 1 View  11/28/2011  *RADIOLOGY REPORT*  Clinical Data: Status post endotracheal tube placement  PORTABLE CHEST - 1 VIEW  Comparison: 11/27/2011  Findings: There is been interval placement of an endotracheal tube. It is directed into the right mainstem bronchus and should be withdrawn 1-2 cm. Decreased aeration of the left lung base is again noted.  A nasogastric catheter is noted within the stomach.  A dialysis catheter is seen and stable.  IMPRESSION: Persistent changes in the left lung base.  Placement of endotracheal tube in to the right mainstem bronchus. This should be withdrawn 1-2 cm.  Nasogastric catheter the stomach.   Original Report Authenticated By: Phillips Odor, M.D.    Ir Paracentesis  11/29/2011  *RADIOLOGY REPORT*  IR PLACEMENT OF A TUNNELED HEMODIALYSIS CATHETER UNDER FLUOROSCOPIC AND ULTRASOUND GUIDANCE; IR ULTRASOUND PARACENTESIS  Date: 11/28/2011  Clinical History: 21 year old female with a complex medical history including anoxic brain injury, nephritis, acute on chronic renal  failure.  Additionally, she has massive abdominal ascites.  She requires central venous access for hemodialysis.  A paracentesis has also been requested for both diagnostic, and therapeutic purposes.  Procedures Performed: 1. Ultrasound-guided puncture of right internal jugular vein 2.  Placement of a tunneled hemodialysis catheter under fluoroscopic guidance 3.  Ultrasound-guided large volume paracentesis  Interventional Radiologist:  Sterling Big, MD  Sedation: Moderate (conscious) sedation was used.  0.5 mg Versed, 25 mcg Fentanyl were administered intravenously.  The patient's vital signs were monitored continuously by radiology nursing throughout the procedure.  Sedation Time: 99 minutes  Fluoroscopy time: 0.5 minutes  PROCEDURE/FINDINGS:   Informed consent was obtained from the patient's mother following explanation  of the procedure, risks, benefits and alternatives. She understands, agrees and consents for the procedure.  All questions were addressed. A time out was performed.  Maximal barrier sterile technique utilized including caps, mask, sterile gowns, sterile gloves, large sterile drape, hand hygiene, and betadine skin prep.  The right neck was interrogated with ultrasound.  The right IJ was found to be widely patent.  Local anesthesia was achieved with infiltration of 1% lidocaine. A small dermatotomy is made with an 11 blade.  Under direct sonographic guidance, the vessel was punctured with a 21-gauge micropuncture needle and a 5-French transitional sheath advanced into the vein.  Measurements to the mid right atrium were then performed with a J-wire.  A 19 cm tip to cuff HemoSplit hemodialysis catheter was selected.  An appropriate skin entry site was selected several centimeters below the clavicle.  Local anesthesia was again achieved with infiltration of 1% lidocaine.  A small incision was made at the skin exit site. The hemodialysis catheter was then tunneled from the skin exit site under  the skin, over the clavicle and out to the dermatotomy overlying the venous access site.  The transitional sheath was then exchanged for a peel-away sheath.  The catheter was advanced through the peel-away sheath and the tip positioned at the superior cavoatrial junction/high right atrium under fluoroscopic guidance.  The catheter aspirates and flushes well.  The catheter was then flushed, and locked with 1000 unit/ml heparinized saline.  The catheter was secured to the skin with 2-0 Prolene suture. The dermatotomy overlying the venous access site was closed with Dermabond.  A sterile bandage was placed.  Attention was then turned to the right lower quadrant.  The right lower quadrant was interrogated with ultrasound.  There is a large volume of sonographically simple-appearing ascites.  An appropriate skin entry site on the lateral right abdominal wall was selected and marked.  Local anesthesia was achieved with infiltration of 1% lidocaine.  A small skin nick was made with an 11 blade scalpel. Under direct sonographic guidance, a 5-French Yueh centesis catheter was advanced to the skin and soft tissues and the peritoneal lining was penetrated.  The tip of the needle was confirmed with and a large volume of ascites, far from bowel or other critical structures.  The needle was then removed and a short Amplatz wire advanced through the UV centesis catheter.  The centesis catheter was then exchanged for an 8-French pigtail drainage catheter.  The pigtail was formed and the catheter connected to low wall suction.  Approximately 10,000 ml of clear yellow ascitic fluid was then successfully aspirated without complication.  No evidence of bleeding during the procedure.  While flow through the catheter ceased, the abdomen was again interrogated with ultrasound.  No evidence of residual fluid in the left or right lower quadrant.  Therefore, suction was removed, the probe cut from the pigtail catheter and a pigtail catheter  removed with gentle traction.  A sterile bandage was applied.  There is no immediate complication.  The patient tolerated the procedure well and was returned to the intensive care unit in unchanged condition.  IMPRESSION:  1.  Successful placement of a right IJ approach 19 cm tip-to-cuff tunneled hemodialysis catheter. The catheter tip is positioned at the superior cavoatrial junction/high right atrium and is ready for immediate use.  2.  Successful ultrasound guided large volume paracentesis.  10 liters of clear yellow ascitic fluid was aspirated without evidence of immediate complication.  A sample of fluid was  sent to the lab for further analysis.  25 mg of 25% albumin was ordered for on chronic replacement.  Signed,  Sterling Big, MD Vascular & Interventional Radiologist [facility]   Original Report Authenticated By: Vilma Prader     Anti-infectives: Anti-infectives     Start     Dose/Rate Route Frequency Ordered Stop   11/29/11 2200   piperacillin-tazobactam (ZOSYN) IVPB 3.375 g  Status:  Discontinued        3.375 g 100 mL/hr over 30 Minutes Intravenous 4 times per day 11/28/11 2116 11/29/11 1303   11/29/11 2200   vancomycin (VANCOCIN) 750 mg in sodium chloride 0.9 % 150 mL IVPB  Status:  Discontinued        750 mg 150 mL/hr over 60 Minutes Intravenous Every 24 hours 11/28/11 2119 11/29/11 1303   11/28/11 2200   levofloxacin (LEVAQUIN) IVPB 500 mg  Status:  Discontinued        500 mg 100 mL/hr over 60 Minutes Intravenous Every 48 hours 11/27/11 0207 11/27/11 1518   11/28/11 2200   vancomycin (VANCOCIN) 750 mg in sodium chloride 0.9 % 150 mL IVPB  Status:  Discontinued        750 mg 150 mL/hr over 60 Minutes Intravenous Every 48 hours 11/27/11 0207 11/27/11 1518   11/28/11 2200   vancomycin (VANCOCIN) 1,250 mg in sodium chloride 0.9 % 250 mL IVPB        1,250 mg 166.7 mL/hr over 90 Minutes Intravenous  Once 11/28/11 2119 11/29/11 0031   11/28/11 1400   ceFAZolin (ANCEF) IVPB 1  g/50 mL premix        1 g 100 mL/hr over 30 Minutes Intravenous  Once 11/28/11 1352 11/28/11 1422   11/27/11 2100   ceFEPIme (MAXIPIME) 1 g in dextrose 5 % 50 mL IVPB  Status:  Discontinued        1 g 100 mL/hr over 30 Minutes Intravenous Every 24 hours 11/27/11 0207 11/27/11 1518   11/27/11 0100   ceFEPIme (MAXIPIME) 1 g in dextrose 5 % 50 mL IVPB        1 g 100 mL/hr over 30 Minutes Intravenous  Once 11/26/11 2348 11/27/11 0321   11/27/11 0000   vancomycin (VANCOCIN) IVPB 1000 mg/200 mL premix        1,000 mg 200 mL/hr over 60 Minutes Intravenous  Once 11/26/11 2345 11/27/11 0158   11/26/11 2115   cefTRIAXone (ROCEPHIN) 1 g in dextrose 5 % 50 mL IVPB        1 g 100 mL/hr over 30 Minutes Intravenous  Once 11/26/11 2105 11/26/11 2215   11/26/11 2115   levofloxacin (LEVAQUIN) IVPB 750 mg  Status:  Discontinued        750 mg 100 mL/hr over 90 Minutes Intravenous Every 24 hours 11/26/11 2105 11/26/11 2311          Assessment/Plan: Patient Active Problem List  Diagnosis  . Acute heart failure  . Hypoxemia  . Pneumonia  . Anemia  . Hypertension  . ? Viral Cardiomyopathy  . Seizures disorder  . Anoxic brain damage  . Myoclonus  . Ileitis  . CKD (chronic kidney disease)  . Hypoalbuminemia  . MPGN (membranoproliferative glomerulonephritis), type 2  . Acute renal failure  . Dehydration  . C. difficile colitis  . Seizure disorder, grand mal  . Encephalopathy acute  . Renal failure (ARF), acute on chronic  . Cardiomyopathy  . Acute respiratory failure  . Pulmonary edema  .  Hemorrhagic shock, due to intraabdominal bleeding following paracentesis  . Hypervolemia  . Ascites    s/p * No surgery found * Continue supportive care No surgical intervention at this time, so we will sign off at this time. Management as per CCM    LOS: 4 days    Bethany Hirt 11/30/2011

## 2011-12-01 ENCOUNTER — Inpatient Hospital Stay (HOSPITAL_COMMUNITY): Payer: Medicaid Other

## 2011-12-01 DIAGNOSIS — J811 Chronic pulmonary edema: Secondary | ICD-10-CM

## 2011-12-01 LAB — GLUCOSE, CAPILLARY
Glucose-Capillary: 82 mg/dL (ref 70–99)
Glucose-Capillary: 83 mg/dL (ref 70–99)

## 2011-12-01 LAB — CBC
Hemoglobin: 10.8 g/dL — ABNORMAL LOW (ref 12.0–15.0)
MCH: 29.5 pg (ref 26.0–34.0)
MCV: 88.8 fL (ref 78.0–100.0)
RBC: 3.66 MIL/uL — ABNORMAL LOW (ref 3.87–5.11)

## 2011-12-01 LAB — RENAL FUNCTION PANEL
BUN: 13 mg/dL (ref 6–23)
CO2: 29 mEq/L (ref 19–32)
Calcium: 7.5 mg/dL — ABNORMAL LOW (ref 8.4–10.5)
Creatinine, Ser: 1.28 mg/dL — ABNORMAL HIGH (ref 0.50–1.10)
Glucose, Bld: 92 mg/dL (ref 70–99)

## 2011-12-01 MED ORDER — LIDOCAINE HCL (PF) 1 % IJ SOLN: 5.0000 mL | INTRAMUSCULAR | Status: DC | PRN

## 2011-12-01 MED ORDER — HYDROXYZINE HCL 25 MG PO TABS
25.0000 mg | ORAL_TABLET | Freq: Three times a day (TID) | ORAL | Status: DC | PRN
  Filled 2011-12-01 (×4): qty 1

## 2011-12-01 MED ORDER — ALTEPLASE 2 MG IJ SOLR: 2.0000 mg | Freq: Once | INTRAMUSCULAR | Status: AC | PRN

## 2011-12-01 MED ORDER — SODIUM CHLORIDE 0.9 % IV SOLN: 100.0000 mL | INTRAVENOUS | Status: DC | PRN

## 2011-12-01 MED ORDER — LIDOCAINE-PRILOCAINE 2.5-2.5 % EX CREA
1.0000 "application " | TOPICAL_CREAM | CUTANEOUS | Status: DC | PRN
  Filled 2011-12-01: qty 5

## 2011-12-01 MED ORDER — PENTAFLUOROPROP-TETRAFLUOROETH EX AERO: 1.0000 "application " | INHALATION_SPRAY | CUTANEOUS | Status: DC | PRN

## 2011-12-01 MED ORDER — HEPARIN SODIUM (PORCINE) 1000 UNIT/ML DIALYSIS
1000.0000 [IU] | INTRAMUSCULAR | Status: DC | PRN
  Administered 2011-12-07: 1000 [IU] via INTRAVENOUS_CENTRAL
  Filled 2011-12-01: qty 1

## 2011-12-01 MED ORDER — NEPRO/CARBSTEADY PO LIQD
237.0000 mL | ORAL | Status: DC | PRN
  Administered 2011-12-02: 237 mL via ORAL
  Filled 2011-12-01: qty 237

## 2011-12-01 MED ORDER — CAMPHOR-MENTHOL 0.5-0.5 % EX LOTN
TOPICAL_LOTION | CUTANEOUS | Status: DC | PRN
  Filled 2011-12-01: qty 222

## 2011-12-01 NOTE — Progress Notes (Signed)
I have seen and examined this patient and agree with plan as outlined by Dr. Dorise Hiss except for CVVHD.  Unfortunately there was a complication with CVVHD and the filter this morning, however she has been off pressors for >24hours and will transition to IHD as her ascites appears to have worsened.  She will require tunneled HD cath and AVF/AVG once her platelets have improved.  Cont with no heparin HD. Garion Wempe A,MD 12/01/2011 10:56 AM

## 2011-12-01 NOTE — Progress Notes (Signed)
CVVH machine didn't detect the PBP bag was empty and air got into the filter. I paged Dr. Arrie Aran, and we discussed stopping CVVH and switching to intermittent HD. I could not return the blood because of too much air in the filter set. Will continue to monitor and wait for MDs to round for further orders.

## 2011-12-01 NOTE — Progress Notes (Signed)
[facility] ROUNDING NOTE   Subjective:   Interval History: 9/8 Paracentesis removed 10 L, hemodynamic instability and bleeding, intubated with bear hugger 9/9 CVVH started, pressors off, BP stable  The patient is awake still intubated. Seems to understand conversation and states the only pain she is having is in her throat from the tube. Still off pressors, with bear hugger. BP stable 120s/100s. CVVH going with 50-100 cc/hr removal. She is having less edema. No other complaints. Follows basic commands. PCCM may try to extubate today based on results of SBTs  Objective:  Vital signs in last 24 hours:  Temp:  [93.2 F (34 C)-99.3 F (37.4 C)] 99.3 F (37.4 C) (09/10 0700) Pulse Rate:  [74-121] 121  (09/10 0700) Resp:  [0-27] 18  (09/10 0700) BP: (80-155)/(52-141) 124/106 mmHg (09/10 0700) SpO2:  [91 %-100 %] 99 % (09/10 0700) Arterial Line BP: (138-154)/(84-93) 139/86 mmHg (09/09 1400) FiO2 (%):  [40 %] 40 % (09/10 0700)  Weight change:  Filed Weights   11/28/11 2000 11/29/11 0600 11/30/11 0400  Weight: 142 lb 6.7 oz (64.6 kg) 167 lb 8.8 oz (76 kg) 160 lb 4.4 oz (72.7 kg)    Intake/Output: I/O last 3 completed shifts: In: 2917 [I.V.:2150; NG/GT:440; IV Piggyback:327] Out: 7089 [Urine:193; Emesis/NG output:425; Other:6471]   Intake/Output this shift:     Vent Mode:  [-] PRVC FiO2 (%):  [40 %] 40 % Set Rate:  [16 bmp] 16 bmp Vt Set:  [350 mL] 350 mL PEEP:  [5 cmH20-8 cmH20] 5 cmH20 Plateau Pressure:  [19 cmH20-24 cmH20] 19 cmH20  PE: Intubated, awake, follows basic commands CVS- RRR RS- intubated and mildly rhoncherous ABD- ascites present, BS + EXT-mild periorbital and peripheral edema   Basic Metabolic Panel:  Lab 12/01/11 1610 11/30/11 1538 11/30/11 0510 11/29/11 1610 11/29/11 0224  NA 136 137 137 140 144  K 3.6 4.1 3.9 3.2* 3.6  CL 103 102 102 104 100  CO2 29 30 27 27 21   GLUCOSE 92 91 87 201* 151*  BUN 13 17 22  29* 44*  CREATININE 1.28*  1.51* 1.77* 2.02* 2.45*  CALCIUM 7.5* 7.9* 7.9* -- --  MG 2.0 -- 1.9 -- 1.6  PHOS 2.8 3.5 4.1 4.2 7.9*    Liver Function Tests:  Lab 12/01/11 0500 11/30/11 1538 11/30/11 0510 11/29/11 1610 11/29/11 0224 11/28/11 2230 11/27/11 0330 11/26/11 1718  AST -- -- -- -- 56* 13 24 23   ALT -- -- -- -- 15 <5 <5 <5  ALKPHOS -- -- -- -- 62 17* 38* 39  BILITOT -- -- -- -- 0.4 0.1* 0.1* 0.2*  PROT -- -- -- -- 6.1 2.7* 6.3 6.5  ALBUMIN 1.4* 1.6* 1.8* 1.7* 2.8*2.7* -- -- --    Lab 11/26/11 1718  AMMONIA 42    CBC:  Lab 12/01/11 0500 11/30/11 0510 11/29/11 1600 11/29/11 1200 11/29/11 0224 11/28/11 2230 11/27/11 0249  WBC 12.2* 14.2* 6.7 4.8 1.9* -- --  NEUTROABS -- -- -- -- -- 3.5 3.8  HGB 10.8* 13.6 12.0 12.4 14.6 -- --  HCT 32.5* 39.1 34.4* 35.6* 41.8 -- --  MCV 88.8 84.8 83.5 83.6 86.0 -- --  PLT 32* 41* 36* 36* 46* -- --    Cardiac Enzymes:  Lab 11/29/11 1200 11/29/11 0251 11/28/11 2051  CKTOTAL -- -- 54  CKMB -- -- 1.9  CKMBINDEX -- -- --  TROPONINI <0.30 <0.30 <0.30   CBG:  Lab 12/01/11 0022 11/30/11 2013 11/30/11 1549 11/30/11 1206 11/30/11 9604  GLUCAP 79 82 67* 100* 68*   Coagulation Studies:  Basename 12/16/2011 1200 2011-12-16 0250 11/28/11 2230  LABPROT 19.2* 16.3* 30.0*  INR 1.58* 1.29 2.81*   Imaging: Dg Chest Port 1 View  11/30/2011  *RADIOLOGY REPORT*  Clinical Data: Follow-up respiratory failure.  Ventilator support.  PORTABLE CHEST - 1 VIEW  Comparison: 98-1013  Findings: Endotracheal tube has its tip 1 cm above the carina. Nasogastric tube enters the abdomen.  Right internal jugular central line has both tips in the SVC.  There is persistent pulmonary volume loss at the bases, most advanced in the left lower lobe.  Little change of the last several films.  No developing finding.  IMPRESSION: Persistent volume loss in the lower lungs, particularly in the left lower lobe.  Lines and tubes well positioned.   Original Report Authenticated By: Thomasenia Sales, M.D.       Medications:      . sodium chloride Stopped (16-Dec-2011 1317)  . dextrose 50 mL/hr at 11/30/11 1149  . fentaNYL infusion INTRAVENOUS 100 mcg/hr (11/30/11 2000)  . dialysis replacement fluid (prismasate) 400 mL/hr at 11/30/11 1945  . dialysis replacement fluid (prismasate) 200 mL/hr at 11/30/11 0553  . dialysate (PRISMASATE) 1,500 mL/hr at 12/01/11 0532  . DISCONTD: feeding supplement (NEPRO CARB STEADY) 237 mL (11/30/11 1200)  . DISCONTD: feeding supplement (OSMOLITE 1.5 CAL)    . DISCONTD: fentaNYL infusion INTRAVENOUS 100 mcg/hr (11/30/11 1500)      . antiseptic oral rinse  15 mL Mouth Rinse QID  . chlorhexidine  15 mL Mouth Rinse BID  . darbepoetin  60 mcg Subcutaneous Q Wed-1800  . feeding supplement (NEPRO CARB STEADY)  1,000 mL Per Tube Q24H  . levetiracetam  250 mg Intravenous Q12H  . pantoprazole (PROTONIX) IV  40 mg Intravenous Q24H  . DISCONTD: feeding supplement (NEPRO CARB STEADY)  237 mL Oral Q24H  . DISCONTD: feeding supplement  30 mL Oral BID   acetaminophen, diphenhydrAMINE, fentaNYL, heparin, midazolam, ondansetron (ZOFRAN) IV, ondansetron, sodium chloride, sodium chloride  Assessment/ Plan:   1.Acute oliguric renal failure on background of Chronic renal failure - On CVVHD, less volume overloaded than yesterday and will continue the CVVH to 50-100 cc/hr to allow the kidneys to resume some of their normal function. Electrolytes okay today. Urine output some yesterday but still very poor.   2. HTN - BP in 120s/100s. Will follow.  3. Ventilator dependent respiratory failure - Still intubated, wake up she follows basic commands and per PCCM extubate when able.   4. Anoxic Brain Injury following PEA arrest - Stable, chronic.  5. Tense ascites. re accumulation after volume removal - Likely this tamponaded the bleeding and non-painful today. However will follow. Hg stable dropped slightly but no signs of re-bleeding.   5. Cardiomyopathy. Repeat 2D Echo ordered  with no change from baseline.  6. MPGN with C3 nephropathy - Will watch Cr but expect jump with this episode. Aranesp continue. Holding losartan.  7. Seizure disorder - Keppra IV.   PCCM primary and will follow closely.    LOS: 5 Genella Mech, MD 12/01/2011,7:52 AM

## 2011-12-01 NOTE — Progress Notes (Signed)
Name: NARYIAH SCHLEY MRN: [id number] DOB: 1990/12/03    LOS: 5  PCCM PROGRESS NOTE  History of Present Illness: The patient is a 21 yo woman, history of CHF (EF 30-35%), CKD (MPGN), presenting 9/5 with encephalopathy, found to have severe ascites, s/p large-volume paracentesis 9/7, and later developed intra-abdominal bleeding, anemia, and hemorrhagic shock, requiring intubation, volume resuscitation, and CVVHD.  Overnight Events:  Patient remains on CVVHD, and remains oliguric.  BP somewhat improved today, but remains labile, from SBP 90's-140's.  Patient tachypneic on vent.  Patient complains of itching this morning.  On WUA, patient can fully respond to commands, appears alert, nods head to questions.  Patient undergoing SBT now.  Lines / Drains: R [place] Permcath 9/6 >> L IJ TLC 9/8 >> ETT 9/7>> R femoral Aline 9/7>>9/9  Cultures: MRSA PCR 9/6 >> neg Blood culture 9/5 >> NGTD Peritoneal culture 9/7 >> NGTD Blood culture 9/7 >> NGTD  Antibiotics: Ceftriaxone 9/5 >> 9/5 Cefepime 9/6 >> 9/6 Cefazolin 9/7 >> 9/7 Vanc 9/6 >> 9/7  Tests / Events: 9/6 Head CT: NAD  9/7 US paracentesis >> 10 liters clear yellow fluid removed  9/7 Hemorrhagic shock with intra-abd bleeding. Hgb down to 1.3 (?).  Pressors started (norepi, phenylephrine, vasopressin, dopamine) 9/8 Pressors discontinued, CVVHD started 9/9 Continued CVVHD  Vital Signs: Temp:  [93.2 F (34 C)-100.2 F (37.9 C)] 100.2 F (37.9 C) (09/10 0800) Pulse Rate:  [74-121] 117  (09/10 0800) Resp:  [0-27] 23  (09/10 0800) BP: (80-155)/(52-141) 142/111 mmHg (09/10 0800) SpO2:  [91 %-100 %] 98 % (09/10 0800) Arterial Line BP: (138-154)/(84-93) 139/86 mmHg (09/09 1400) FiO2 (%):  [40 %] 40 % (09/10 0800) I/O last 3 completed shifts: In: 2917 [I.V.:2150; NG/GT:440; IV Piggyback:327] Out: 7089 [Urine:193; Emesis/NG output:425; Other:6471]  Physical Examination: General:  Lying in bed, "squirming" somewhat, appears  uncomfortable Neuro:  Sedated but arouseable, nods responses to questions, RASS +1 HEENT:  intubated Neck:  No JVD Cardiovascular: RRR Lungs: Mechanical breath sounds, no wheezes Abdomen:  Firm, non-tender, distended Musculoskeletal: mild bilateral edema Skin: intact  Ventilator settings: Vent Mode:  [-] PRVC FiO2 (%):  [40 %] 40 % Set Rate:  [16 bmp] 16 bmp Vt Set:  [350 mL] 350 mL PEEP:  [5 cmH20-8 cmH20] 5 cmH20 Plateau Pressure:  [19 cmH20-24 cmH20] 19 cmH20   Assessment and Plan: The patient is a 21 yo woman, history of CHF and CKD, presenting with ascites, with hospital course complicated by acute blood loss anemia, hypotension, and acute respiratory failure.  # Acute Blood Loss Anemia on Anemia of Chronic Disease - secondary to intra-abdominal bleeding, c/b hemorrhagic shock, with Hb reaching a nadir of 1.3 on 9/7 (?real).  S/p transfusion of 8 pRBC's, 4 platelets, 4 FFP.  Hb stable over the last few days presumably representing resolution of bleed, though Hb has dropped today.  Transferrin saturation ratio 52 prior to transfusions.  Lab 11/30/20 0500 11/29/20 0510 11/29/11 1600 11/29/11 1200 11/29/11 0224  HGB 10.8* 13.6 12.0 12.4 14.6  -daily cbc's -repeat CBC this PM -continue aranesp -transfuse if Hb <7  # Acute Respiratory Failure - intubated due to hemorrhagic shock, with respiratory status improving daily -continue full mechanical ventilation -pcxr in AM -daily SBT, may be able to extubate today depending on SBT today  # Ascites - the patient presented with tense ascites, with 10 L removed by paracenetsis, though now with tense abdomen likely representing reaccumulation of fluid.  Ascites is likely secondary to hypoalbuminemia secondary  to severe malnutrition.  No known history of cirrhosis, LFT's normal (except low albumin). -will slowly increase tube feeds per nutrition recs -continue to monitor albumin  # Acute on Chronic Kidney Disease - Patient with AKI  secondary to ATN from hemorrhagic shock, with a known history of MPGN.  Patient currently oliguric. -appreciate renal consult -stop CVVHD per renal, transition to intermittent HD  # Hypotension - Resolved. secondary to hemorrhagic shock (now improved) vs hypovolemia (given ascites, s/p large volume paracentesis).  Patient also has a history of CHF, with EF 30-35%.  Patient on pressors 9/7-9/8. -continue to monitor CVP  # Hypothermia - likely secondary to anemia -bear hugger in place  # History of seizures -continue keppra  Best practices / Disposition: -->ICU status under PCCM -->full code -->Heparin for DVT Px -->Protonix for GI Px -->ventilator bundle -->Tube feeds - started nepro today -->family updated at bedside  Signed, Janalyn Harder, MD PGY-2, Internal Medicine Pgr: [phone] 12/01/2011, 8:18 AM  Seen on CCM rounds this morning with resident MD or ACNP above.  Pt examined and database reviewed. I agree with above findings, assessment and plan as reflected in the note above. 35 mins CCM time  Billy Fischer, MD;  PCCM service; Mobile [phone]

## 2011-12-01 NOTE — Progress Notes (Deleted)
Dr. Briant Cedar called and informed of pts BP dropping in the 60's despite being maxed out on vasopressin and levophed. Hemodialysis Tx stopped 26 minutes early. BP improved with rinsing pt back from Tx.

## 2011-12-01 NOTE — Progress Notes (Deleted)
I stat readings called to Dr. Briant Cedar. States to change hemodialysis bath to 1k till the end of Tx or pt is picked up for surgery.

## 2011-12-01 NOTE — Evaluation (Signed)
Clinical/Bedside Swallow Evaluation Patient Details  Name: Heather Sims MRN: [id number] Date of Birth: [birth year]  Today's Date: 12/01/2011 Time: 3086-5784 SLP Time Calculation (min): 18 min  Past Medical History:  Past Medical History  Diagnosis Date  . Pneumonia     'walking pneumonia'  . Seizures   . Anemia   . Heart attack   . Cardiomyopathy   . Heart failure   . CHF (congestive heart failure)   . Ileitis   . Chronic kidney disease   . Renal disorder   . MPGN (membranoproliferative glomerulonephritis), type 2   . Hypertension   . Brain injury    Past Surgical History:  Past Surgical History  Procedure Date  . Renal biopsy    HPI:  21 year-old female with history of C3 nephropathy, anoxic brain injury, seizure, cardiomyopathy, hypertension was found to be hypoxic as noticed by patient's home health care nurse. In addition patient's mother also noticed that patient is increasingly somnolent over the last 4-5 days. Patient's ammonia level was found to be high week ago by patient's neurologist and was started on Keppra and the plan was to taper off Depakote. Today in the ER patient's Depakote levels are found to be high more than therapeutic. Patient's chest x-ray shows infiltrates concerning for pneumonia. Patient also has tense ascites with increased lower extremity fluid. S/p large-volume paracentesis 9/7, and later developed intra-abdominal bleeding, anemia, and hemorrhagic shock, requiring intubation, volume resuscitation, and CVVHD.   Assessment / Plan / Recommendation Clinical Impression  Patient presents with a functional oropharyngeal swallow without overt s/s of aspiration observed.  Oral transit of bolus noted across consistencies, greatest with soft solids with decreased rotary chewing which is baseline from previous admissions. Recommend initiation of mechanical soft diet, thin liquids with close f/u. Aspiration risk increased at this time due to recent intubation  and general deconditioning.     Aspiration Risk  Moderate    Diet Recommendation Dysphagia 3 (Mechanical Soft);Thin liquid   Liquid Administration via: Cup;Straw Medication Administration: Whole meds with liquid Supervision: Patient able to self feed;Full supervision/cueing for compensatory strategies Compensations: Slow rate;Small sips/bites Postural Changes and/or Swallow Maneuvers: Seated upright 90 degrees    Other  Recommendations Oral Care Recommendations: Oral care BID   Follow Up Recommendations  None    Frequency and Duration min 2x/week  2 weeks   Pertinent Vitals/Pain n/a    SLP Swallow Goals Patient will consume recommended diet without observed clinical signs of aspiration with: Modified independent assistance Swallow Study Goal #1 - Progress: Not Met Patient will utilize recommended strategies during swallow to increase swallowing safety with: Modified independent assistance Swallow Study Goal #2 - Progress: Not met   Swallow Study    General HPI: 21 year-old female with history of C3 nephropathy, anoxic brain injury, seizure, cardiomyopathy, hypertension was found to be hypoxic as noticed by patient's home health care nurse. In addition patient's mother also noticed that patient is increasingly somnolent over the last 4-5 days. Patient's ammonia level was found to be high week ago by patient's neurologist and was started on Keppra and the plan was to taper off Depakote. Today in the ER patient's Depakote levels are found to be high more than therapeutic. Patient's chest x-ray shows infiltrates concerning for pneumonia. Patient also has tense ascites with increased lower extremity fluid. S/p large-volume paracentesis 9/7, and later developed intra-abdominal bleeding, anemia, and hemorrhagic shock, requiring intubation, volume resuscitation, and CVVHD. Type of Study: Bedside swallow evaluation Previous Swallow Assessment:  patient known to this SLP from previous  admissions which have cleared patient for either a regular or mechanical soft diet.  Diet Prior to this Study: NPO;IV Temperature Spikes Noted: No Respiratory Status: Supplemental O2 delivered via (comment) (2 L nasal cannula) History of Recent Intubation: Yes Length of Intubations (days): 3 days Date extubated: 12/01/11 Behavior/Cognition: Alert;Cooperative;Pleasant mood Oral Cavity - Dentition: Adequate natural dentition Self-Feeding Abilities: Able to feed self;Needs assist (due to UE tremors) Patient Positioning: Upright in bed Baseline Vocal Quality: Low vocal intensity;Clear Volitional Cough: Strong Volitional Swallow: Able to elicit    Oral/Motor/Sensory Function Overall Oral Motor/Sensory Function: Impaired (generalized weakness)   Ice Chips Ice chips: Not tested   Thin Liquid Thin Liquid: Within functional limits (except mild oral holding) Presentation: Cup;Straw;Self Fed    Nectar Thick Nectar Thick Liquid: Not tested   Honey Thick Honey Thick Liquid: Not tested   Puree Puree: Impaired Presentation: Spoon Oral Phase Impairments: Impaired anterior to posterior transit Oral Phase Functional Implications: Prolonged oral transit   Solid   GO   Heather Wimer MA, CCC-SLP [phone]  Solid: Impaired Presentation: Self Fed Oral Phase Impairments: Impaired anterior to posterior transit Oral Phase Functional Implications: Oral residue (trace, moderately prolongued A-P transit)       Heather Sims [name] 12/01/2011,2:42 PM

## 2011-12-01 NOTE — Care Management Note (Signed)
    Page 1 of 1   12/01/2011     11:36:17 AM   CARE MANAGEMENT NOTE 12/01/2011  Patient:  Heather Sims, Heather Sims   Account Number:  [id number]  Date Initiated:  11/30/2011  Documentation initiated by:  [facility]  Subjective/Objective Assessment:   hypotensive post thoracentsis - intubated andon pressors.  Lives with mother.     Action/Plan:   Anticipated DC Date:  12/07/2011   Anticipated DC Plan:  IP REHAB FACILITY      DC Planning Services  CM consult      Choice offered to / List presented to:             Status of service:  In process, will continue to follow Medicare Important Message given?   (If response is "NO", the following Medicare IM given date fields will be blank) Date Medicare IM given:   Date Additional Medicare IM given:    Discharge Disposition:    Per UR Regulation:  Reviewed for med. necessity/level of care/duration of stay  If discussed at Long Length of Stay Meetings, dates discussed:    Comments:  Contact:  Rogelia Boga Mother [phone]  12-01-11 11:30am [name] - [phone] Spoke with patient who was extubated today and mom in room. Have been home with [facility] nurse prior to this admission.  Interested in higher level of rehab.  PT/OT ordered.  Would like CIR is able.  11-30-11 11:45am [name], RNBSN [phone] hope to extubated today - post thoracentesis on pressors and intubated.  PT/OT post extubatation - ?? CIR or SNF. SW already following.

## 2011-12-01 NOTE — Procedures (Signed)
Extubation Procedure Note  Patient Details:   Name: Heather Sims DOB: 1990/06/22 MRN: [id number]   Airway Documentation:     Evaluation  O2 sats: stable throughout and currently acceptable Complications: No apparent complications Patient did tolerate procedure well. Bilateral Breath Sounds: Clear;Diminished Suctioning: Oral Yes    Kandra Nicolas 12/01/2011, 9:41 AM

## 2011-12-02 ENCOUNTER — Inpatient Hospital Stay (HOSPITAL_COMMUNITY): Payer: Medicaid Other

## 2011-12-02 DIAGNOSIS — N186 End stage renal disease: Secondary | ICD-10-CM | POA: Diagnosis present

## 2011-12-02 DIAGNOSIS — E8809 Other disorders of plasma-protein metabolism, not elsewhere classified: Secondary | ICD-10-CM

## 2011-12-02 DIAGNOSIS — I10 Essential (primary) hypertension: Secondary | ICD-10-CM

## 2011-12-02 LAB — GLUCOSE, CAPILLARY
Glucose-Capillary: 102 mg/dL — ABNORMAL HIGH (ref 70–99)
Glucose-Capillary: 84 mg/dL (ref 70–99)
Glucose-Capillary: 86 mg/dL (ref 70–99)
Glucose-Capillary: 108 mg/dL — ABNORMAL HIGH (ref 70–99)

## 2011-12-02 LAB — CBC
MCH: 29.2 pg (ref 26.0–34.0)
MCHC: 33.4 g/dL (ref 30.0–36.0)
MCV: 87.4 fL (ref 78.0–100.0)
Platelets: 42 10*3/uL — ABNORMAL LOW (ref 150–400)

## 2011-12-02 LAB — RENAL FUNCTION PANEL
Calcium: 8.1 mg/dL — ABNORMAL LOW (ref 8.4–10.5)
Creatinine, Ser: 1.18 mg/dL — ABNORMAL HIGH (ref 0.50–1.10)
GFR calc Af Amer: 75 mL/min — ABNORMAL LOW (ref >90)
GFR calc non Af Amer: 65 mL/min — ABNORMAL LOW (ref >90)
Glucose, Bld: 105 mg/dL — ABNORMAL HIGH (ref 70–99)
Phosphorus: 1.5 mg/dL — ABNORMAL LOW (ref 2.3–4.6)
Sodium: 137 mEq/L (ref 135–145)

## 2011-12-02 LAB — BODY FLUID CULTURE

## 2011-12-02 LAB — HEPATITIS B CORE ANTIBODY, TOTAL: Hep B Core Total Ab: NEGATIVE

## 2011-12-02 MED ORDER — CARVEDILOL 25 MG PO TABS
25.0000 mg | ORAL_TABLET | Freq: Two times a day (BID) | ORAL | Status: DC
  Administered 2011-12-02 – 2011-12-03 (×4): 25 mg via ORAL
  Filled 2011-12-02 (×8): qty 1

## 2011-12-02 MED ORDER — LEVETIRACETAM 250 MG PO TABS
250.0000 mg | ORAL_TABLET | Freq: Two times a day (BID) | ORAL | Status: DC
  Administered 2011-12-02 – 2011-12-09 (×15): 250 mg via ORAL
  Filled 2011-12-02 (×17): qty 1

## 2011-12-02 MED ORDER — HYDRALAZINE HCL 10 MG PO TABS
10.0000 mg | ORAL_TABLET | ORAL | Status: DC | PRN
  Filled 2011-12-02: qty 4

## 2011-12-02 MED ORDER — DARBEPOETIN ALFA-POLYSORBATE 60 MCG/0.3ML IJ SOLN
INTRAMUSCULAR | Status: AC
  Administered 2011-12-02: 60 ug
  Filled 2011-12-02: qty 0.3

## 2011-12-02 MED ORDER — DARBEPOETIN ALFA-POLYSORBATE 60 MCG/0.3ML IJ SOLN: 60.0000 ug | INTRAMUSCULAR | Status: DC

## 2011-12-02 MED ORDER — ACETAMINOPHEN 325 MG PO TABS
ORAL_TABLET | ORAL | Status: AC
  Administered 2011-12-02: 650 mg
  Filled 2011-12-02: qty 2

## 2011-12-02 MED ORDER — HYDRALAZINE HCL 20 MG/ML IJ SOLN
10.0000 mg | INTRAMUSCULAR | Status: DC | PRN
  Administered 2011-12-02 – 2011-12-04 (×3): 10 mg via INTRAVENOUS
  Administered 2011-12-05: 5 mg via INTRAVENOUS
  Administered 2011-12-05 – 2011-12-07 (×4): 10 mg via INTRAVENOUS
  Filled 2011-12-02 (×4): qty 2

## 2011-12-02 NOTE — Progress Notes (Signed)
I have seen and examined this patient and agree with plan as outlined by Dr. Dorise Hiss.  The documentation of low BP was erroneous.  She tolerated HD well and will plan to cont with IHD.  Agree with transfer to SDU.  Will need permanent access once platelets improve. Anyah Swallow A,MD 12/02/2011 11:43 AM

## 2011-12-02 NOTE — Progress Notes (Signed)
[facility] ROUNDING NOTE   Subjective:   Interval History: 9/8 Paracentesis removed 10 L, hemodynamic instability and bleeding, intubated with bear hugger 9/9 CVVH started, pressors off, BP stable 9/10 Extubated, CVVH stopped, intermittent dialysis started  The patient is awake and not intubated this morning. She is sleepy but able to tell me that she is not having any pain. No problems breathing. She states she was really tired yesterday in dialysis and does not remember much of it. Otherwise no complaints.   Objective:  Vital signs in last 24 hours:  Temp:  [92.1 F (33.4 C)-100.8 F (38.2 C)] 99.1 F (37.3 C) (09/11 0400) Pulse Rate:  [79-121] 79  (09/11 0700) Resp:  [4-29] 6  (09/11 0700) BP: (93-188)/(75-131) 161/118 mmHg (09/11 0700) SpO2:  [93 %-100 %] 98 % (09/11 0700) FiO2 (%):  [40 %] 40 % (09/10 0915) Weight:  [150 lb 2.1 oz (68.1 kg)-156 lb 1.4 oz (70.8 kg)] 150 lb 2.1 oz (68.1 kg) (09/11 0331)  Weight change:  Filed Weights   11/30/11 0400 12/02/11 0003 12/02/11 0331  Weight: 160 lb 4.4 oz (72.7 kg) 156 lb 1.4 oz (70.8 kg) 150 lb 2.1 oz (68.1 kg)    Intake/Output: I/O last 3 completed shifts: In: 2410 [P.O.:90; I.V.:1875; NG/GT:230; IV Piggyback:215] Out: 5033 [Urine:101; Other:4932]    PE: General: resting in bed, no acute distress HEENT: PERRL, EOMI, no scleral icterus Cardiac: RRR, no rubs, murmurs or gallops Pulm: moving normal volumes of air, no wheezes or rales appreciated Abd: soft, nontender, distended, BS present Ext: warm and well perfused, no pedal edema Neuro: alert and oriented X2 (not to date), cranial nerves II-XII grossly intact  Basic Metabolic Panel:  Lab 12/02/11 8295 12/01/11 0500 11/30/11 1538 11/30/11 0510 11/29/11 1610 11/29/11 0224  NA 137 136 137 137 140 --  K 3.9 3.6 4.1 3.9 3.2* --  CL 103 103 102 102 104 --  CO2 28 29 30 27 27  --  GLUCOSE 105* 92 91 87 201* --  BUN 8 13 17 22  29* --  CREATININE 1.18* 1.28*  1.51* 1.77* 2.02* --  CALCIUM 8.1* 7.5* 7.9* -- -- --  MG -- 2.0 -- 1.9 -- 1.6  PHOS 1.5* 2.8 3.5 4.1 4.2 --    Liver Function Tests:  Lab 12/02/11 0500 12/01/11 2330 12/01/11 0500 11/30/11 1538 11/30/11 0510 11/29/11 1610 11/29/11 0224 11/28/11 2230 11/27/11 0330 11/26/11 1718  AST -- -- -- -- -- -- 56* 13 24 23   ALT -- 6 -- -- -- -- 15 <5 <5 <5  ALKPHOS -- -- -- -- -- -- 62 17* 38* 39  BILITOT -- -- -- -- -- -- 0.4 0.1* 0.1* 0.2*  PROT -- -- -- -- -- -- 6.1 2.7* 6.3 6.5  ALBUMIN 1.5* -- 1.4* 1.6* 1.8* 1.7* -- -- -- --    Lab 11/26/11 1718  AMMONIA 42    CBC:  Lab 12/02/11 0500 12/01/11 0500 11/30/11 0510 11/29/11 1600 11/29/11 1200 11/28/11 2230 11/27/11 0249  WBC 12.1* 12.2* 14.2* 6.7 4.8 -- --  NEUTROABS -- -- -- -- -- 3.5 3.8  HGB 12.1 10.8* 13.6 12.0 12.4 -- --  HCT 36.2 32.5* 39.1 34.4* 35.6* -- --  MCV 87.4 88.8 84.8 83.5 83.6 -- --  PLT 42* 32* 41* 36* 36* -- --    Cardiac Enzymes:  Lab 11/29/11 1200 11/29/11 0251 11/28/11 2051  CKTOTAL -- -- 54  CKMB -- -- 1.9  CKMBINDEX -- -- --  TROPONINI <  0.30 <0.30 <0.30   CBG:  Lab 12/02/11 0357 12/01/11 1941 12/01/11 1543 12/01/11 1153 12/01/11 0828  GLUCAP 102* 99 90 83 80   Coagulation Studies:  Basename 11/29/11 1200  LABPROT 19.2*  INR 1.58*   Imaging: Dg Chest Port 1 View  12/02/2011  *RADIOLOGY REPORT*  Clinical Data: Dyspnea.  Status post extubation.  PORTABLE CHEST - 1 VIEW  Comparison: Chest x-ray 12/01/2011.  Findings: Previously noted endotracheal and nasogastric tubes have been removed. There is a left-sided internal jugular central venous catheter with tip terminating in the proximal superior vena cava. A right internal jugular PermCath with the tips terminating at the superior cavoatrial junction and in the distal superior vena cava. Lung volumes are low with persistent bibasilar opacities (left much greater than right), which may represent areas of atelectasis and/or consolidation.  Pulmonary venous  congestion, without frank pulmonary edema.  Small left pleural effusion.  Mild enlargement of the cardiopericardial silhouette is again noted. The patient is rotated to the right on today's exam, resulting in distortion of the mediastinal contours and reduced diagnostic sensitivity and specificity for mediastinal pathology.  IMPRESSION: 1.  Support apparatus, as above. 2.  Persistent low lung volumes with persistent bibasilar opacities (left much greater than right).  Left lower lobe consolidation (from infection or aspiration) is suspected, while there is also likely some bibasilar subsegmental atelectasis, and a small left pleural effusion. 3.  Mild pulmonary venous congestion, without frank pulmonary edema.   Original Report Authenticated By: Florencia Reasons, M.D.    Dg Chest Port 1 View  12/01/2011  *RADIOLOGY REPORT*  Clinical Data: Respiratory failure, evaluate endotracheal tube position  PORTABLE CHEST - 1 VIEW  Comparison: Portable chest x-ray of 11/30/2011  Findings: The tip of the endotracheal tube is approximally 3.7 cm above the carina.  The lungs are poorly aerated with bibasilar opacities left greater than right.  Cardiomegaly is stable.  Right central venous line and left central venous line are unchanged.  IMPRESSION: Endotracheal tube tip 3.7 cm above carina.  Little change in poor aeration, and airspace disease particularly in the lung bases.   Original Report Authenticated By: Juline Patch, M.D.      Medications:      . sodium chloride Stopped (11/29/11 1317)  . dextrose 50 mL/hr at 12/02/11 0631  . fentaNYL infusion INTRAVENOUS Stopped (12/01/11 0900)  . DISCONTD: dialysis replacement fluid (prismasate) 400 mL/hr at 11/30/11 1945  . DISCONTD: dialysis replacement fluid (prismasate) 200 mL/hr at 11/30/11 0553  . DISCONTD: dialysate (PRISMASATE) 1,500 mL/hr at 12/01/11 0532      . acetaminophen      . darbepoetin      . darbepoetin  60 mcg Subcutaneous Q Wed-1800  .  feeding supplement (NEPRO CARB STEADY)  1,000 mL Per Tube Q24H  . levetiracetam  250 mg Intravenous Q12H  . pantoprazole (PROTONIX) IV  40 mg Intravenous Q24H  . DISCONTD: antiseptic oral rinse  15 mL Mouth Rinse QID  . DISCONTD: chlorhexidine  15 mL Mouth Rinse BID   sodium chloride, sodium chloride, acetaminophen, alteplase, camphor-menthol, diphenhydrAMINE, feeding supplement (NEPRO CARB STEADY), fentaNYL, heparin, hydrOXYzine, lidocaine, lidocaine-prilocaine, midazolam, ondansetron (ZOFRAN) IV, ondansetron, pentafluoroprop-tetrafluoroeth, sodium chloride, DISCONTD: heparin, DISCONTD: sodium chloride  Assessment/ Plan:   1.Acute oliguric renal failure on background of Chronic renal failure - Switched to intermittent dialysis yesterday and some question of hypotension in dialysis (see nursing note, does not agree with charted vitals on HD doc flowsheet). Electrolytes are fine today and  will monitor but likely will be HD bound for life.   2. HTN - BP in 160s/100s. Will follow.   3. Ventilator dependent respiratory failure - Resolved.  4. Anoxic Brain Injury following PEA arrest - Stable, chronic.   5. Cardiomyopathy. Repeat 2D Echo ordered with no change from baseline.  6. MPGN with C3 nephropathy - Aranesp continue. Holding losartan but would recommend restart today.  7. Seizure disorder - Keppra IV and could change to PO given passed swallow study.   PCCM primary and will follow closely.    LOS: 6 Genella Mech, MD 12/02/2011,7:42 AM

## 2011-12-02 NOTE — Evaluation (Signed)
Occupational Therapy Evaluation Patient Details Name: ALVERNA FAWLEY MRN: [id number] DOB: 11-07-1990 Today's Date: 12/02/2011 Time: 4098-1191 OT Time Calculation (min): 36 min  OT Assessment / Plan / Recommendation Clinical Impression  Pt presents with VDRF, CKD, Ascites, and Encephalopathy.  Pt with Hx of Anoxic BI and PEA in January.  pt had been living with mother PTA. Pt will benefit from skilled OT in the acute setting to maximize I with ADL and ADL mobility prior to d/c.     OT Assessment  Patient needs continued OT Services    Follow Up Recommendations  Inpatient Rehab    Barriers to Discharge      Equipment Recommendations  Defer to next venue    Recommendations for Other Services Rehab consult  Frequency  Min 2X/week    Precautions / Restrictions Precautions Precautions: Fall Restrictions Weight Bearing Restrictions: No   Pertinent Vitals/Pain Pt with no c/o pain. BP remained high, but stable with OOB activity.      ADL  Grooming: Performed;Wash/dry face;Supervision/safety;Set up Where Assessed - Grooming: Supported sitting Lower Body Bathing: Simulated;+2 Total assistance Lower Body Bathing: Patient Percentage: 30% Where Assessed - Lower Body Bathing: Supported sit to stand Upper Body Dressing: Performed;Moderate assistance Where Assessed - Upper Body Dressing: Supported sitting Lower Body Dressing: Performed;+2 Total assistance Lower Body Dressing: Patient Percentage: 20% Where Assessed - Lower Body Dressing: Supported sit to stand Toilet Transfer: Simulated;+2 Total assistance Toilet Transfer: Patient Percentage: 30% Statistician Method: Surveyor, minerals:  (bed to chair) Toileting - Architect and Hygiene: Simulated;+2 Total assistance Toileting - Clothing Manipulation and Hygiene: Patient Percentage: 20% Equipment Used: Gait belt Transfers/Ambulation Related to ADLs: need for bil knee blocking.     OT Diagnosis:  Generalized weakness  OT Problem List: Decreased strength;Decreased range of motion;Decreased activity tolerance;Impaired balance (sitting and/or standing);Decreased coordination;Decreased knowledge of use of DME or AE;Decreased knowledge of precautions;Cardiopulmonary status limiting activity;Impaired UE functional use;Increased edema OT Treatment Interventions: Self-care/ADL training;DME and/or AE instruction;Therapeutic activities;Balance training;Patient/family education   OT Goals Acute Rehab OT Goals OT Goal Formulation: With patient Time For Goal Achievement: 12/16/11 Potential to Achieve Goals: Good ADL Goals Pt Will Perform Grooming: Standing at sink;with supervision (with necessary number of seated rest breaks) ADL Goal: Grooming - Progress: Goal set today Pt Will Perform Upper Body Bathing: with supervision;with set-up;Sitting, edge of bed;Sitting, chair ADL Goal: Upper Body Bathing - Progress: Goal set today Pt Will Perform Lower Body Bathing: Sit to stand from bed;Sit to stand from chair;with supervision ADL Goal: Lower Body Bathing - Progress: Goal set today Pt Will Perform Upper Body Dressing: with set-up;with supervision;Sitting, bed;Sitting, chair ADL Goal: Upper Body Dressing - Progress: Goal set today Pt Will Transfer to Toilet: Ambulation;with DME;with supervision ADL Goal: Toilet Transfer - Progress: Goal set today Pt Will Perform Toileting - Hygiene: with set-up;with supervision;Sit to stand from 3-in-1/toilet ADL Goal: Toileting - Hygiene - Progress: Goal set today Additional ADL Goal #1: Pt will perform dynamic/static standing balance activities for greater than or equal to with less than 3 rest breaks in prep for standing ADLs ADL Goal: Additional Goal #1 - Progress: Goal set today  Visit Information  Last OT Received On: 12/02/11 Assistance Needed: +2 PT/OT Co-Evaluation/Treatment: Yes    Subjective Data  Subjective: I'll watch this (referring to  cartoons) Patient Stated Goal: Be able to walk again   Prior Functioning  Vision/Perception  Home Living Lives With: Family (mother) Available Help at Discharge: Family;Available 24 hours/day  Type of Home: House Home Access: Stairs to enter [employer] of Steps: 2 Home Layout: One level Bathroom Shower/Tub: Tub/shower unit;Curtain Bathroom Toilet: Standard Home Adaptive Equipment: Bedside commode/3-in-1;Tub transfer bench;Walker - rolling;Wheelchair - manual Prior Function Level of Independence: Needs assistance Needs Assistance: Bathing;Dressing;Toileting;Meal Prep;Light Housekeeping;Gait Bath: Minimal Dressing: Minimal Toileting: Minimal Meal Prep: Total Light Housekeeping: Total Gait Assistance: with RW needs S around home.   Able to Take Stairs?: No Driving: No Vocation: On disability Communication Communication: No difficulties (However pt slow to process.  ) Dominant Hand: Right      Cognition  Overall Cognitive Status: Impaired Area of Impairment: Attention;Memory;Following commands;Safety/judgement;Awareness of deficits;Problem solving Arousal/Alertness: Awake/alert Orientation Level: Appears intact for tasks assessed Behavior During Session: Flat affect Current Attention Level: Sustained Following Commands: Follows one step commands consistently;Follows one step commands with increased time Safety/Judgement: Decreased awareness of need for assistance    Extremity/Trunk Assessment Right Upper Extremity Assessment RUE ROM/Strength/Tone: Deficits RUE ROM/Strength/Tone Deficits: Shoulder 3+; elbow and wrist 4-; grip 3+ RUE Coordination: Deficits RUE Coordination Deficits: slow, purposeful movements with poor motor control Left Upper Extremity Assessment LUE ROM/Strength/Tone: Deficits LUE ROM/Strength/Tone Deficits: Shoulder 3+; elbow and wrist 4; grip 3+ LUE Coordination: Deficits LUE Coordination Deficits: slow, purposeful movements with poor  motor control Right Lower Extremity Assessment RLE ROM/Strength/Tone: Deficits RLE ROM/Strength/Tone Deficits: pt with increased tone and tremors.  AAROM WFL and strength grossly 3+/5 Left Lower Extremity Assessment LLE ROM/Strength/Tone: Deficits LLE ROM/Strength/Tone Deficits: pt with increased tone and tremors.  AAROM WFL and strength 3+/5   Mobility  Shoulder Instructions  Bed Mobility Bed Mobility: Rolling Left;Left Sidelying to Sit;Sitting - Scoot to [employer] of Bed Rolling Left: 4: Min assist Left Sidelying to Sit: 3: Mod assist Sitting - Scoot to Edge of Bed: 2: Max assist Details for Bed Mobility Assistance: cues for sequencing, safe technique, increased time to process and initiate tasks. Incr tone when attempting to bring bil LE to EOB Transfers Transfers: Sit to Stand;Stand to Sit Sit to Stand: 1: +2 Total assist;With upper extremity assist;From bed Sit to Stand: Patient Percentage: 50% Stand to Sit: 1: +2 Total assist;With upper extremity assist;To chair/3-in-1 Stand to Sit: Patient Percentage: 30% Details for Transfer Assistance: cues for safe technique, sequencing, and encouragement.  pt needs blocked at Bil LEs as knees will buckle and unable to step LEs during SPT.         Exercise     Balance Balance Balance Assessed: Yes Static Sitting Balance Static Sitting - Balance Support: Bilateral upper extremity supported;Feet supported Static Sitting - Level of Assistance: 5: Stand by assistance;3: Mod assist Static Sitting - Comment/# of Minutes: pt initially requiring ModA to maintain balance and prevent posterior lean, but with cueing and time pt begins to self-correct balance and becomes S.     End of Session OT - End of Session Equipment Utilized During Treatment: Gait belt Activity Tolerance: Patient limited by fatigue Patient left: in chair;with call bell/phone within reach Nurse Communication: Mobility status  GO     Trenae Brunke 12/02/2011, 9:15 AM

## 2011-12-02 NOTE — Evaluation (Signed)
Physical Therapy Evaluation Patient Details Name: Heather Sims MRN: [id number] DOB: [birth year] Today's Date: 12/02/2011 Time: 4098-1191 PT Time Calculation (min): 25 min  PT Assessment / Plan / Recommendation Clinical Impression  pt presents with VDRF, CKD, Ascites, and Encephalopathy.  pt with Hx of Anoxic BI and PEA in January.  pt had been living with mother and would benefit from continued rehab to Max I to return home.      PT Assessment  Patient needs continued PT services    Follow Up Recommendations  Inpatient Rehab    Barriers to Discharge None      Equipment Recommendations  Defer to next venue    Recommendations for Other Services Rehab consult   Frequency Min 3X/week    Precautions / Restrictions Precautions Precautions: Fall Restrictions Weight Bearing Restrictions: No   Pertinent Vitals/Pain Denies pain.        Mobility  Bed Mobility Bed Mobility: Rolling Left;Left Sidelying to Sit;Sitting - Scoot to [place] of Bed Rolling Left: 4: Min assist Left Sidelying to Sit: 3: Mod assist Sitting - Scoot to Edge of Bed: 2: Max assist Details for Bed Mobility Assistance: cues for sequencing, safe technique, increased time to process and initiate tasks. Incr tone when attempting to bring bil LE to EOB Transfers Transfers: Sit to Stand;Stand to Sit;Stand Pivot Transfers Sit to Stand: 1: +2 Total assist;With upper extremity assist;From bed Sit to Stand: Patient Percentage: 50% Stand to Sit: 1: +2 Total assist;With upper extremity assist;To chair/3-in-1 Stand to Sit: Patient Percentage: 30% Stand Pivot Transfers: 1: +2 Total assist Stand Pivot Transfers: Patient Percentage: 30% Details for Transfer Assistance: cues for safe technique, sequencing, and encouragement.  pt needs blocked at Bil LEs as knees will buckle and unable to step LEs during SPT.   Ambulation/Gait Ambulation/Gait Assistance: Not tested (comment) Stairs: No Wheelchair Mobility Wheelchair  Mobility: No    Exercises     PT Diagnosis: Difficulty walking;Generalized weakness  PT Problem List: Decreased strength;Decreased activity tolerance;Decreased balance;Decreased mobility;Decreased coordination;Decreased cognition;Decreased knowledge of use of DME;Decreased safety awareness;Impaired tone PT Treatment Interventions: DME instruction;Gait training;Functional mobility training;Therapeutic activities;Therapeutic exercise;Balance training;Neuromuscular re-education;Cognitive remediation;Patient/family education   PT Goals Acute Rehab PT Goals PT Goal Formulation: With patient Time For Goal Achievement: 12/16/11 Potential to Achieve Goals: Good Pt will go Supine/Side to Sit: with modified independence PT Goal: Supine/Side to Sit - Progress: Goal set today Pt will go Sit to Supine/Side: with modified independence PT Goal: Sit to Supine/Side - Progress: Goal set today Pt will go Sit to Stand: with modified independence PT Goal: Sit to Stand - Progress: Goal set today Pt will go Stand to Sit: with modified independence PT Goal: Stand to Sit - Progress: Goal set today Pt will Stand: with supervision;3 - 5 min;with unilateral upper extremity support PT Goal: Stand - Progress: Goal set today Pt will Ambulate: 51 - 150 feet;with supervision;with rolling walker PT Goal: Ambulate - Progress: Goal set today  Visit Information  Last PT Received On: 12/02/11 Assistance Needed: +2 PT/OT Co-Evaluation/Treatment: Yes    Subjective Data  Subjective: Ok.   Patient Stated Goal: I want to walk.     Prior Functioning  Home Living Lives With: Family (mother) Available Help at Discharge: Family;Available 24 hours/day Type of Home: House Home Access: Stairs to enter [employer] of Steps: 2 Home Layout: One level Bathroom Shower/Tub: Tub/shower unit;Curtain Bathroom Toilet: Standard Home Adaptive Equipment: Bedside commode/3-in-1;Tub transfer bench;Walker - rolling;Wheelchair  - manual Prior Function Level of Independence: Needs  assistance Needs Assistance: Bathing;Dressing;Toileting;Meal Prep;Light Housekeeping;Gait Bath: Minimal Dressing: Minimal Toileting: Minimal Meal Prep: Total Light Housekeeping: Total Gait Assistance: with RW needs S around home.   Able to Take Stairs?: No Driving: No Vocation: On disability Communication Communication: No difficulties (However pt slow to process.  ) Dominant Hand: Right    Cognition  Overall Cognitive Status: Impaired Area of Impairment: Attention;Memory;Following commands;Safety/judgement;Awareness of deficits;Problem solving Arousal/Alertness: Awake/alert Orientation Level: Appears intact for tasks assessed Behavior During Session: Flat affect Current Attention Level: Sustained Following Commands: Follows one step commands consistently;Follows one step commands with increased time Safety/Judgement: Decreased awareness of need for assistance    Extremity/Trunk Assessment Right Upper Extremity Assessment RUE ROM/Strength/Tone: Deficits RUE ROM/Strength/Tone Deficits: Shoulder 3+; elbow and wrist 4-; grip 3+ RUE Coordination: Deficits RUE Coordination Deficits: slow, purposeful movements with poor motor control Left Upper Extremity Assessment LUE ROM/Strength/Tone: Deficits LUE ROM/Strength/Tone Deficits: Shoulder 3+; elbow and wrist 4; grip 3+ LUE Coordination: Deficits LUE Coordination Deficits: slow, purposeful movements with poor motor control Right Lower Extremity Assessment RLE ROM/Strength/Tone: Deficits RLE ROM/Strength/Tone Deficits: pt with increased tone and tremors.  AAROM WFL and strength grossly 3+/5 Left Lower Extremity Assessment LLE ROM/Strength/Tone: Deficits LLE ROM/Strength/Tone Deficits: pt with increased tone and tremors.  AAROM WFL and strength 3+/5   Balance Balance Balance Assessed: Yes Static Sitting Balance Static Sitting - Balance Support: Bilateral upper extremity  supported;Feet supported Static Sitting - Level of Assistance: 5: Stand by assistance;3: Mod assist Static Sitting - Comment/# of Minutes: pt initially requiring ModA to maintain balance and prevent posterior lean, but with cueing and time pt begins to self-correct balance and becomes S.    End of Session PT - End of Session Equipment Utilized During Treatment: Gait belt Activity Tolerance: Patient limited by fatigue Patient left: in chair;with call bell/phone within reach Nurse Communication: Mobility status  GP     Sunny Schlein, Brown [phone] 12/02/2011, 9:10 AM

## 2011-12-02 NOTE — Progress Notes (Signed)
Speech Language Pathology Dysphagia Treatment Patient Details Name: Heather Sims MRN: [id number] DOB: 1990-07-26 Today's Date: 12/02/2011 Time: 1345-1400 SLP Time Calculation (min): 15 min  Assessment / Plan / Recommendation Clinical Impression  Treatment focused on diet tolerance and family education. Patient refused pos today, reporting significant lethargy following therapies today. Mother in room, present and supportive. Noted most recetn CXR indicating LLL consolidation (infection vs aspiration) suspected. Additionally, CXR on admission indicated extensive airspace consolidation throughout lung base and LLM, question sequelae of large volume aspiration. Doubt dysphagia is cause as no overt indication of aspiration noted on initial evaluation during this admission or during previous admissions when seen by this SLP.  Per mother report, patient very lethargic for four to five days PTA, holding food in mouth, falling asleep which eating and drinking. ? aspiration event during this period of decreased alertness and mentation. Regardless of baseline function, aspiration risk remains moderate at this time given fluctuating levels of alertness. Education complete with patient and mother regarding need to ensure fully alert state prior to po intake. SLP will continue to f/u closely.     Diet Recommendation  Continue with Current Diet: Dysphagia 3 (mechanical soft);Thin liquid    SLP Plan Continue with current plan of care   Pertinent Vitals/Pain n/a   Swallowing Goals  SLP Swallowing Goals Patient will consume recommended diet without observed clinical signs of aspiration with: Modified independent assistance Swallow Study Goal #1 - Progress: Progressing toward goal Patient will utilize recommended strategies during swallow to increase swallowing safety with: Modified independent assistance Swallow Study Goal #2 - Progress: Progressing toward goal  General Temperature Spikes Noted: Yes (low  grade fever overnight) Respiratory Status: Room air Behavior/Cognition: Lethargic Oral Cavity - Dentition: Adequate natural dentition Patient Positioning: Upright in bed  Oral Cavity - Oral Hygiene     Dysphagia Treatment Treatment focused on: Patient/family/caregiver education;Skilled observation of diet tolerance Family/Caregiver Educated: mother Patient observed directly with PO's: No Reason PO's not observed: Lethargic;Refused   GO   [employer] MA, CCC-SLP [phone]   Heather Sims 12/02/2011, 3:00 PM

## 2011-12-02 NOTE — Progress Notes (Signed)
Name: Heather Sims MRN: [id number] DOB: Aug 06, 1990    LOS: 6  PCCM PROGRESS NOTE  History of Present Illness: The patient is a 21 yo woman, history of CHF (EF 30-35%), CKD (MPGN), presenting 9/5 with encephalopathy, found to have severe ascites, s/p large-volume paracentesis 9/7, and later developed intra-abdominal bleeding, anemia, and hemorrhagic shock, requiring intubation, volume resuscitation, and CVVHD.  Overnight Events:  Patient remains oliguric/anuric, with decline in UOP yesterday.  The patient tolerated dialysis well this morning.  Low-grade fever to 100.8 overnight.  CXR appears unchanged this morning, with persistent low lung volumes.  The patient tolerated a dysphagia 3 diet yesterday evening.  Lines / Drains: R [place] Permcath 9/6 >> L IJ TLC 9/8 >> 9/11 ETT 9/7>>9/10 R femoral Aline 9/7>>9/9  Cultures: MRSA PCR 9/6 >> neg Blood culture 9/5 >> NGTD Peritoneal culture 9/7 >> NGTD Blood culture 9/7 >> NGTD  Antibiotics: Ceftriaxone 9/5 >> 9/5 Cefepime 9/6 >> 9/6 Cefazolin 9/7 >> 9/7 Vanc 9/6 >> 9/7  Tests / Events: 9/6 Head CT: NAD  9/7 US paracentesis >> 10 liters clear yellow fluid removed  9/7 Hemorrhagic shock with intra-abd bleeding. Hgb down to 1.3 (?).  Pressors started (norepi, phenylephrine, vasopressin, dopamine) 9/8 Pressors discontinued, CVVHD started 9/9 Continued CVVHD 9/10 Extubated, CVVHD discontinued, intermittent HD started 9/11 BP stable (pt actually hypertensive now), restarted coreg, respiratory stable post-extubation, transfer to stepdown  Vital Signs: Temp:  [92.1 F (33.4 C)-100.8 F (38.2 C)] 99.1 F (37.3 C) (09/11 0400) Pulse Rate:  [79-121] 79  (09/11 0700) Resp:  [4-29] 6  (09/11 0700) BP: (93-188)/(75-131) 161/118 mmHg (09/11 0700) SpO2:  [93 %-100 %] 98 % (09/11 0700) FiO2 (%):  [40 %] 40 % (09/10 0915) Weight:  [150 lb 2.1 oz (68.1 kg)-156 lb 1.4 oz (70.8 kg)] 150 lb 2.1 oz (68.1 kg) (09/11 0331) I/O last 3 completed  shifts: In: 2410 [P.O.:90; I.V.:1875; NG/GT:230; IV Piggyback:215] Out: 5033 [Urine:101; Other:4932]  Physical Examination: General:  Lying in bed, appears comfortable Neuro:  A&O x3, moves all 4 extremities, appropriately conversant  HEENT:  PERRL, EOMI Neck:  No JVD Cardiovascular: RRR Lungs: distant breath sounds in bilateral bases, mild crackles heard Abdomen:  Firm, non-tender, distended Musculoskeletal: no edema Skin: intact  Assessment and Plan: The patient is a 21 yo woman, history of CHF and CKD, presenting with ascites, with hospital course complicated by acute blood loss anemia, hypotension, and acute respiratory failure.  # Acute Blood Loss Anemia on Anemia of Chronic Disease - secondary to intra-abdominal bleeding, c/b hemorrhagic shock, with Hb reaching a nadir of 1.3 on 9/7 (?real).  S/p transfusion of 8 pRBC's, 4 platelets, 4 FFP.  Hb stable over the last few days presumably representing resolution of bleed (?erroneous value 9/10).  Transferrin saturation ratio 52 prior to transfusions.  Lab 12/02/11 0500 12/01/11 0500 11/30/11 0510 11/29/11 1600 11/29/11 1200  HGB 12.1 10.8* 13.6 12.0 12.4  -follow cbc's -continue aranesp -transfuse if Hb <7  # Ascites - the patient presented with tense ascites, with 10 L removed by paracentesis, though now with tense abdomen likely representing reaccumulation of fluid due to intr-abd bleeding (presumed).  Ascites is likely secondary to hypoalbuminemia/CRI/volume overload.  No known history of cirrhosis, LFT's normal (except low albumin). -patient tolerating dysphagia 3 diet -hepatitis panel in progress  # Acute on Chronic Kidney Disease - Patient with AKI secondary to ATN from hemorrhagic shock, with a known history of MPGN.  Patient currently oliguric/anuric. Now likely ESRD -appreciate  renal consult -continue intermittent HD per renal  # Thrombocytopenia - may represent consumption of platelets from large bleed vs HIT -HIT panel  pending - No heparins - Monitor counts  # Hypertension - the patient initially presented with hypotension due to hemorrhagic shock and hypovolemia, on pressors 9/7-9/8, but now has consistently elevated BP's.  On home amlodipine, coreg, lasix, losartan. -restart coreg today -start hydralazine prn to maintain SBP < 170 mmHg and MAP < 120 mmHg  # Hypothermia -resolved  # History of seizures -continue keppra  # Acute Respiratory Failure - Resolved.  Patient now breathing without difficulty  Best practices / Disposition: -->Transfer to [employer] to [facility] (Spoke with Junious Silk) -->full code -->SCD for DVT Px -->no GI Px indicated -->tolerating dys 3 diet -->family updated at bedside -->discontinue foley and central line 9/11  Signed, Heather Harder, MD PGY-2, Internal Medicine Pgr: [phone] 12/02/2011, 7:38 AM   Seen on CCM rounds this morning with resident MD or ACNP above.  Pt examined and database reviewed. I agree with above findings, assessment and plan as reflected in the note above.   Heather Fischer, MD;  PCCM service; [place] [phone]

## 2011-12-03 ENCOUNTER — Inpatient Hospital Stay (HOSPITAL_COMMUNITY): Payer: Medicaid Other

## 2011-12-03 DIAGNOSIS — R5381 Other malaise: Secondary | ICD-10-CM

## 2011-12-03 DIAGNOSIS — G931 Anoxic brain damage, not elsewhere classified: Secondary | ICD-10-CM

## 2011-12-03 DIAGNOSIS — J9600 Acute respiratory failure, unspecified whether with hypoxia or hypercapnia: Secondary | ICD-10-CM

## 2011-12-03 DIAGNOSIS — I423 Endomyocardial (eosinophilic) disease: Secondary | ICD-10-CM

## 2011-12-03 DIAGNOSIS — N186 End stage renal disease: Secondary | ICD-10-CM

## 2011-12-03 LAB — RENAL FUNCTION PANEL
Albumin: 1.4 g/dL — ABNORMAL LOW (ref 3.5–5.2)
Calcium: 8.2 mg/dL — ABNORMAL LOW (ref 8.4–10.5)
GFR calc Af Amer: 40 mL/min — ABNORMAL LOW (ref >90)
GFR calc non Af Amer: 34 mL/min — ABNORMAL LOW (ref >90)
Glucose, Bld: 81 mg/dL (ref 70–99)
Phosphorus: 2 mg/dL — ABNORMAL LOW (ref 2.3–4.6)
Potassium: 3.9 mEq/L (ref 3.5–5.1)
Sodium: 138 mEq/L (ref 135–145)

## 2011-12-03 LAB — CBC
Hemoglobin: 13.2 g/dL (ref 12.0–15.0)
MCH: 29.3 pg (ref 26.0–34.0)
MCHC: 33.3 g/dL (ref 30.0–36.0)
Platelets: 76 10*3/uL — ABNORMAL LOW (ref 150–400)
RDW: 15.1 % (ref 11.5–15.5)

## 2011-12-03 LAB — CULTURE, BLOOD (ROUTINE X 2): Culture: NO GROWTH

## 2011-12-03 LAB — HEPARIN INDUCED THROMBOCYTOPENIA PNL
Patient O.D.: 0.16
UFH High Dose UFH H: 0 % Release
UFH Low Dose 0.1 IU/mL: 0 % Release
UFH SRA Result: NEGATIVE

## 2011-12-03 LAB — GLUCOSE, CAPILLARY: Glucose-Capillary: 76 mg/dL (ref 70–99)

## 2011-12-03 MED ORDER — DEXTROSE 5 % IV SOLN
1.5000 g | INTRAVENOUS | Status: AC
  Filled 2011-12-03: qty 1.5

## 2011-12-03 MED ORDER — LISINOPRIL 10 MG PO TABS
10.0000 mg | ORAL_TABLET | Freq: Every day | ORAL | Status: DC
  Administered 2011-12-03 – 2011-12-04 (×2): 10 mg via ORAL
  Filled 2011-12-03 (×2): qty 1

## 2011-12-03 MED ORDER — NEPRO/CARBSTEADY PO LIQD
237.0000 mL | Freq: Two times a day (BID) | ORAL | Status: DC
  Administered 2011-12-03 – 2011-12-05 (×4): 237 mL via ORAL
  Filled 2011-12-03 (×9): qty 237

## 2011-12-03 NOTE — Consult Note (Signed)
[facility] CONSULT NOTE 12/03/2011 DOB: [birth year] MRN : [id number]  BJ:[id number] Referring Physician:Joseph A Coladonato, MD   History of Present Illness: [birth year] 1[birth year]20 y/o female with no past medical history presented to the [facility] ED after respiratory arrest this evening. Her mother states that she has had URI symptoms for the past month including cough and shortness of breath. She was treated with one week of avelox for "walking pneumonia" two weeks ago. Her symptoms did not improve so she was seen again in urgent care and given an albuterol inhaler and an antihypertensive because she had profoundly elevated blood pressure. She continued to have orthopnea, pnd, and shortness of breath in the week prior to this admission. On the night of admission she had the sudden onset of shortness of breath while out with a friend getting food. He says that he called 911 and she was still talking when EMS arrived, but suddenly developed respiratory arrest after their arrival. EMS performed CPR for PEA arrest for less than five minutes and she regained a pulse with no meds. Upon arrival to the [facility] ED she was intubated and started on the arctic sun protocol. She was hypertensive after arrival. PCCM consulted for admission  This admission 11-26-2011 21 year-old female with history of C3 nephropathy, anoxic brain injury, seizure, cardiomyopathy, hypertension was found to be hypoxic as noticed by patient's home health care nurse. In addition patient's mother also noticed that patient is increasingly somnolent over the last 4-5 days. Patient's ammonia level was found to be high week ago by patient's neurologist and was started on Keppra and the plan was to taper off Depakote. Today in the ER patient's Depakote levels are found to be high more than therapeutic. Patient's chest x-ray shows infiltrates concerning for pneumonia. Patient also has tense ascites with increased lower extremity fluid. Patient has been  admitted for further management.  We are being consulted for AV fistula and diatek placement.      Past Medical History  Diagnosis Date  . Pneumonia     'walking pneumonia'  . Seizures   . Anemia   . Heart attack   . Cardiomyopathy   . Heart failure   . CHF (congestive heart failure)   . Ileitis   . Chronic kidney disease   . Renal disorder   . MPGN (membranoproliferative glomerulonephritis), type 2   . Hypertension   . Brain injury     Past Surgical History  Procedure Date  . Renal biopsy      ROS: [x]  Positive  [ ]  Denies    General: [ ]  Weight loss, [ ]  Fever, [ ]  chills Neurologic: [ ]  Dizziness, [ ]  Blackouts, [ ]  Seizure [ ]  Stroke, [ ]  "Mini stroke", [ ]  Slurred speech, [ ]  Temporary blindness; [x ] weakness in arms or legs, [ ]  Hoarseness Cardiac: [ ]  Chest pain/pressure, [x ] Shortness of breath at rest [ ]  Shortness of breath with exertion, [ ]  Atrial fibrillation or irregular heartbeat Vascular: [ ]  Pain in legs with walking, [ ]  Pain in legs at rest, [ ]  Pain in legs at night,  [ ]  Non-healing ulcer, [ ]  Blood clot in vein/DVT,   Pulmonary: [ ]  Home oxygen, [ ]  Productive cough, [ ]  Coughing up blood, [ ]  Asthma,  [ ]  Wheezing Musculoskeletal:  [ ]  Arthritis, [ ]  Low back pain, [ ]  Joint pain Hematologic: [ ]  Easy Bruising, [ ]  Anemia; [ ]  Hepatitis Gastrointestinal: [ ]   Blood in stool, [ ]  Gastroesophageal Reflux/heartburn, [ ]  Trouble swallowing Urinary: [x ] chronic Kidney disease, [ ]  on HD - [ ]  MWF or [ ]  TTHS, [ ]  Burning with urination, [ ]  Difficulty urinating Skin: [ ]  Rashes, [ ]  Wounds Psychological: [ ]  Anxiety, [ ]  Depression Poor appetite.  Social History History  Substance Use Topics  . Smoking status: Never Smoker   . Smokeless tobacco: Never Used  . Alcohol Use: No    Family History Family History  Problem Relation Age of Onset  . Hypertension Mother   . Diabetes Father     No Known Allergies  Current  Facility-Administered Medications  Medication Dose Route Frequency Provider Last Rate Last Dose  . 0.9 %  sodium chloride infusion   Intravenous Continuous Lonia Farber, MD   20 mL at 11/29/11 0830  . 0.9 %  sodium chloride infusion  100 mL Intravenous PRN Irena Cords, MD      . 0.9 %  sodium chloride infusion  100 mL Intravenous PRN Irena Cords, MD      . acetaminophen (TYLENOL) tablet 650 mg  650 mg Per Tube Q6H PRN Merwyn Katos, MD   650 mg at 12/02/11 2232  . camphor-menthol (SARNA) lotion   Topical PRN Linward Headland, MD      . carvedilol (COREG) tablet 25 mg  25 mg Oral BID WC Linward Headland, MD   25 mg at 12/02/11 1812  . darbepoetin (ARANESP) injection 60 mcg  60 mcg Subcutaneous Q Wed-1800 Konstantin Zubelevitskiy, MD      . diphenhydrAMINE (BENADRYL) injection 25 mg  25 mg Intravenous Q6H PRN Lonia Farber, MD   25 mg at 12/01/11 0331  . heparin injection 1,000 Units  1,000 Units Dialysis PRN Irena Cords, MD      . hydrALAZINE (APRESOLINE) injection 10-40 mg  10-40 mg Intravenous Q4H PRN Merwyn Katos, MD   10 mg at 12/02/11 1506  . hydrOXYzine (ATARAX/VISTARIL) tablet 25 mg  25 mg Oral TID PRN Linward Headland, MD      . levETIRAcetam (KEPPRA) tablet 250 mg  250 mg Oral BID Linward Headland, MD   250 mg at 12/02/11 2232  . lidocaine (XYLOCAINE) 1 % injection 5 mL  5 mL Intradermal PRN Irena Cords, MD      . lidocaine-prilocaine (EMLA) cream 1 application  1 application Topical PRN Irena Cords, MD      . ondansetron (ZOFRAN) tablet 4 mg  4 mg Oral Q6H PRN Eduard Clos, MD       Or  . ondansetron (ZOFRAN) injection 4 mg  4 mg Intravenous Q6H PRN Eduard Clos, MD      . pentafluoroprop-tetrafluoroeth (GEBAUERS) aerosol 1 application  1 application Topical PRN Irena Cords, MD      . sodium chloride 0.9 % injection 10 mL  10 mL Intravenous PRN Domenick Roma, MD      . DISCONTD: feeding supplement (NEPRO CARB  STEADY) liquid 237 mL  237 mL Oral PRN Irena Cords, MD   237 mL at 12/02/11 1400     Imaging: Dg Chest Port 1 View  12/02/2011  *RADIOLOGY REPORT*  Clinical Data: Dyspnea.  Status post extubation.  PORTABLE CHEST - 1 VIEW  Comparison: Chest x-ray 12/01/2011.  Findings: Previously noted endotracheal and nasogastric tubes have been removed. There is a left-sided internal jugular central venous catheter with tip terminating in the proximal  superior vena cava. A right internal jugular PermCath with the tips terminating at the superior cavoatrial junction and in the distal superior vena cava. Lung volumes are low with persistent bibasilar opacities (left much greater than right), which may represent areas of atelectasis and/or consolidation.  Pulmonary venous congestion, without frank pulmonary edema.  Small left pleural effusion.  Mild enlargement of the cardiopericardial silhouette is again noted. The patient is rotated to the right on today's exam, resulting in distortion of the mediastinal contours and reduced diagnostic sensitivity and specificity for mediastinal pathology.  IMPRESSION: 1.  Support apparatus, as above. 2.  Persistent low lung volumes with persistent bibasilar opacities (left much greater than right).  Left lower lobe consolidation (from infection or aspiration) is suspected, while there is also likely some bibasilar subsegmental atelectasis, and a small left pleural effusion. 3.  Mild pulmonary venous congestion, without frank pulmonary edema.   Original Report Authenticated By: Florencia Reasons, M.D.     Significant Diagnostic Studies: CBC Lab Results  Component Value Date   WBC 11.9* 12/03/2011   HGB 13.2 12/03/2011   HCT 39.6 12/03/2011   MCV 87.8 12/03/2011   PLT 76* 12/03/2011    BMET    Component Value Date/Time   NA 138 12/03/2011 0525   K 3.9 12/03/2011 0525   CL 104 12/03/2011 0525   CO2 27 12/03/2011 0525   GLUCOSE 81 12/03/2011 0525   BUN 16 12/03/2011 0525    CREATININE 2.01* 12/03/2011 0525   CREATININE 2.63* 10/12/2011 1230   CALCIUM 8.2* 12/03/2011 0525   CALCIUM 8.4 10/28/2011 1327   GFRNONAA 34* 12/03/2011 0525   GFRAA 40* 12/03/2011 0525    COAG Lab Results  Component Value Date   INR 1.58* 11/29/2011   INR 1.29 11/29/2011   INR 2.81* 11/28/2011   No results found for this basename: PTT     Physical Examination BP Readings from Last 3 Encounters:  12/03/11 132/99  11/11/11 146/99  10/28/11 134/94   Temp Readings from Last 3 Encounters:  12/03/11 98 F (36.7 C) Oral  11/11/11 97.3 F (36.3 C) Oral  10/16/11 98.4 F (36.9 C) Oral   SpO2 Readings from Last 3 Encounters:  12/03/11 98%  11/11/11 96%  10/16/11 95%   Pulse Readings from Last 3 Encounters:  12/03/11 104  11/11/11 80  10/28/11 94    General:  WDWN in NAD Gait: Non ambulatory HENT: WNL Eyes: Pupils equal Pulmonary: normal non-labored breathing , without Rales, rhonchi,  wheezing Cardiac: RRR, without  Murmurs, rubs or gallops; No carotid bruits Abdomen: soft, NT, no masses Skin: no rashes, ulcers noted Vascular Exam/Pulses:Palpable radial pulses bilateral.  Right hand dominant. Bilateral LE palpable DP/PT pulses.  Extremities without ischemic changes, no Gangrene , no cellulitis; no open wounds;  Musculoskeletal: there is  muscle wasting or atrophy  Neurologic: A&O X 3; Appropriate Affect ;  SENSATION: normal; MOTOR FUNCTION: Pt has poor and not equal strength in all extremities - 3/5 Speech is fluent/normal  Non-Invasive Vascular Imaging: Pending Vein mapping  ASSESSMENT/PLAN: Pending vein mapping we plan on AV fistula and diatek placement By Dr. Edilia Bo 12-04-2011 for dialysis access.  COLLINS, EMMA MAUREEN PA-C  As above we were asked to place a cuffed dialysis catheter tomorrow and evaluate her for an AVF/AVG. The patient currently has a cuffed dialysis catheter which has reportedly been working well. I have reviewed her vein map from July and she  has very small veins. She will likely require an AVG  and will not be a candidate for an AVF. On exam, she is markedly debilitated and weak. She does have a radial pulse on the left. I would favor placing an AVG next week when she is a little stronger. Also, CXR yesterday shows LLL consolidation, possibly infection. As she will likely require placement of a prosthetic graft it would be best to wait until there is no concern for infection.  Di Kindle. Edilia Bo, MD, FACS Beeper [phone] 12/03/2011

## 2011-12-03 NOTE — Procedures (Signed)
Patient was seen on dialysis and the procedure was supervised. BFR 300 Via temp cath BP is 141/100.  Patient appears to be tolerating treatment well

## 2011-12-03 NOTE — Progress Notes (Signed)
[facility] PROGRESS NOTE  Heather Sims NGE:[id number] DOB: 02-04-1991 DOA: 11/26/2011 PCP: Jaclyn Shaggy, MD  Assessment/Plan: Active Problems:  Anemia presumed to be due to acute intra-abdominal blood loss as a complication of paracentesis S/p transfusion of 8 U prbc, Hgb was noted to jump from 1.3 to 14.6 in a matter of 4 hrs- I am highly suspicious of the accuracy of the Hgb of 1.3. Prior to this her Hgb was 9.0 - noted to have increased in a matter of hours  to 12-13. Hgb has since been stable.   Hypotension/ shock Likely due to fluid shifts from large volume paracentesis- resolved with volume resusitation  Acute Oliguric Renal failure on chronic renal failure- MPGN Type 2 dialysis per nephrology via dialysis catheter - Per Dr Edilia Bo, she will need a graft in left arm rather than a fistula   Ascites Re-accumulation after large volume paracentesis- no signs of cirrhosis and therefore likely due to progression of renal failure and hypoalbuminemia  Seizure disorder with Depakote toxicity  Depakote has been discontinued and the patient is receiving Keppra   Cardiomyopathy  Ejection fraction 40-45% via echocardiogram January 2013 - repeat ECHO reveals progression of LV and RV systolic function-  EF of 30-35% and moderate concentric hypertrophy of LV, mild to moderately reduced RV function- B blocker ordered- cont Coreg at current dose Add ACE I  Congested cough/ infiltrate in LLL? Obtain PA/Lat CXR and sputum culture  Acute encephalopathy  CT scan of head without acute findings - ammonia level is within normal range - urinalysis is without suggestion of urinary tract infection - Depakote level was markedly elevated at presentation - perhaps this was primarily Depakote induced lethargy plus or minus uremia - B12 and folic acid levels pending   Anoxic brain damage s/p PEA arrest  January 2013 secondary to cardio-respiratory failure from pulmonary edema secondary to acute  renal failure   Thrombocytopenia Suspected to be Depakote induced compounded by acute blood loss- will need to cont to follow for improvement which could take weeks   HTN  currently receiving Coreg - as mentioned above, adding ACE I.   Code Status: full code Family Communication: no family in room Disposition Plan: follow in SDU DVT prophylaxis: SCDs   Brief narrative: The patient is a 21 yo woman, history of CHF (EF 30-35%), CKD (MPGN), presenting 9/5 with encephalopathy, found to have severe ascites, s/p large-volume paracentesis 9/7, and later developed intra-abdominal bleeding, anemia, and hemorrhagic shock, requiring intubation, volume resuscitation, and CVVHD.   Consultants:  Nephrology  General surgery  Procedures: R [place] Permcath 9/6 L IJ TLC 9/8  ETT 9/7 R femoral Aline 9/7>>9/9 Paracentesis 9/7  HPI/Subjective: Pt alert, speech slow and deliberate- no current complaints. She is noted to be coughing and sounds congested- she is unable to tell me how long she has had this cough. No complaints of dyspnea or chest pain.   Objective: Filed Vitals:   12/03/11 1100 12/03/11 1130 12/03/11 1159 12/03/11 1205  BP: 140/86 121/97 119/89 132/99  Pulse: 100 100 102 104  Temp:    98 F (36.7 C)  TempSrc:    Oral  Resp: 12 21 11 18   Height:      Weight:    60.6 kg (133 lb 9.6 oz)  SpO2:    98%    Intake/Output Summary (Last 24 hours) at 12/03/11 1535 Last data filed at 12/03/11 1205  Gross per 24 hour  Intake      0 ml  Output   4000 ml  Net  -4000 ml    Exam:   General:  Alert, appears fatigued, no distress  Cardiovascular: RRR, no murmurs  Respiratory: CTA b/l   Abdomen: soft, moderate distension, BS+, nontender  Ext: no c/c/e  Data Reviewed: Basic Metabolic Panel:  Lab 12/03/11 1610 12/02/11 0500 12/01/11 0500 11/30/11 1538 11/30/11 0510 11/29/11 0224  NA 138 137 136 137 137 --  K 3.9 3.9 3.6 4.1 3.9 --  CL 104 103 103 102 102 --  CO2 27 28 29 30  27  --  GLUCOSE 81 105* 92 91 87 --  BUN 16 8 13 17 22  --  CREATININE 2.01* 1.18* 1.28* 1.51* 1.77* --  CALCIUM 8.2* 8.1* 7.5* 7.9* 7.9* --  MG -- -- 2.0 -- 1.9 1.6  PHOS 2.0* 1.5* 2.8 3.5 4.1 --   Liver Function Tests:  Lab 12/03/11 0525 12/02/11 0500 12/01/11 2330 12/01/11 0500 11/30/11 1538 11/30/11 0510 11/29/11 0224 11/28/11 2230 11/27/11 0330 11/26/11 1718  AST -- -- -- -- -- -- 56* 13 24 23   ALT -- -- 6 -- -- -- 15 <5 <5 <5  ALKPHOS -- -- -- -- -- -- 62 17* 38* 39  BILITOT -- -- -- -- -- -- 0.4 0.1* 0.1* 0.2*  PROT -- -- -- -- -- -- 6.1 2.7* 6.3 6.5  ALBUMIN 1.4* 1.5* -- 1.4* 1.6* 1.8* -- -- -- --   No results found for this basename: LIPASE:5,AMYLASE:5 in the last 168 hours  Lab 11/26/11 1718  AMMONIA 42   CBC:  Lab 12/03/11 0525 12/02/11 0500 12/01/11 0500 11/30/11 0510 11/29/11 1600 11/28/11 2230 11/27/11 0249  WBC 11.9* 12.1* 12.2* 14.2* 6.7 -- --  NEUTROABS -- -- -- -- -- 3.5 3.8  HGB 13.2 12.1 10.8* 13.6 12.0 -- --  HCT 39.6 36.2 32.5* 39.1 34.4* -- --  MCV 87.8 87.4 88.8 84.8 83.5 -- --  PLT 76* 42* 32* 41* 36* -- --   Cardiac Enzymes:  Lab 11/29/11 1200 11/29/11 0251 11/28/11 2051  CKTOTAL -- -- 54  CKMB -- -- 1.9  CKMBINDEX -- -- --  TROPONINI <0.30 <0.30 <0.30   BNP (last 3 results)  Basename 03/26/11 2140  PROBNP 11557.0*   CBG:  Lab 12/03/11 1239 12/02/11 2140 12/02/11 1638 12/02/11 1155 12/02/11 0805  GLUCAP 76 91 108* 86 84       Studies: reviewed  Scheduled Meds:   . carvedilol  25 mg Oral BID WC  . cefUROXime (ZINACEF)  IV  1.5 g Intravenous On Call to OR  . darbepoetin  60 mcg Subcutaneous Q Wed-1800  . feeding supplement (NEPRO CARB STEADY)  237 mL Oral BID BM  . levETIRAcetam  250 mg Oral BID   Continuous Infusions:   . sodium chloride Stopped (11/29/11 1317)    ________________________________________________________________________  Time spent: 30 min    [facility]  [facility] Pager [phone] If  8PM-8AM, please contact night-coverage at [email or web address], password [facility] 12/03/2011, 3:35 PM  LOS: 7 days

## 2011-12-03 NOTE — Progress Notes (Signed)
Nutrition Follow-up  Intervention:   1. Recommend nutrition support given prolonged suboptimal PO intake and ongoing malnutrition 2. Add Nepro Shake po BID, each supplement provides 425 kcal and 19 grams protein. 3. Recommend renal MVI while on HD 4. RD to continue to follow nutrition care plan  DOCUMENTATION CODES  Per approved criteria   -Severe malnutrition in the context of chronic illness    Assessment:   Pt underwent 10 L paracentesis, followed by tense abdomen and re-accumulation of fluid. Per MD, ascites is likely 2/2 hypoalbuminemia/CRI/volume overload. Pt is now receiving intermittent HD, per nephrology "likely will be HD bound for life."  Noted SLP discussed intake with mom, and discovered patient was very lethargic for four to five days PTA per mother, holding food in mouth, falling asleep which eating and drinking.   Extubated on 9/10. BSE completed on the same day, recommending Dysphagia 3 with thin liquids. RN reports that pt is consuming very little, <25% of meals.  Noted sodium, potassium, and magnesium WNL. Current phosphorus low.  Pt meets criteria for severe malnutrition in the context of chronic illness as evidenced by severe fluid accumulation, intake of <75% of estimated energy requirement for at least 1 month, and likely muscle mass loss given inadequate intake/lethargy.  Diet Order:  Dysphagia 3 with thin liquids Supplements: none  Meds: Scheduled Meds:   . carvedilol  25 mg Oral BID WC  . darbepoetin  60 mcg Subcutaneous Q Wed-1800  . levETIRAcetam  250 mg Oral BID   Continuous Infusions:   . sodium chloride Stopped (11/29/11 1317)   PRN Meds:.sodium chloride, sodium chloride, acetaminophen, camphor-menthol, diphenhydrAMINE, heparin, hydrALAZINE, hydrOXYzine, lidocaine, lidocaine-prilocaine, ondansetron (ZOFRAN) IV, ondansetron, pentafluoroprop-tetrafluoroeth, sodium chloride, DISCONTD: feeding supplement (NEPRO CARB STEADY)  Labs:  CMP     Component  Value Date/Time   NA 138 12/03/2011 0525   K 3.9 12/03/2011 0525   CL 104 12/03/2011 0525   CO2 27 12/03/2011 0525   GLUCOSE 81 12/03/2011 0525   BUN 16 12/03/2011 0525   CREATININE 2.01* 12/03/2011 0525   CREATININE 2.63* 10/12/2011 1230   CALCIUM 8.2* 12/03/2011 0525   CALCIUM 8.4 10/28/2011 1327   PROT 6.1 11/29/2011 0224   ALBUMIN 1.4* 12/03/2011 0525   AST 56* 11/29/2011 0224   ALT 6 12/01/2011 2330   ALKPHOS 62 11/29/2011 0224   BILITOT 0.4 11/29/2011 0224   GFRNONAA 34* 12/03/2011 0525   GFRAA 40* 12/03/2011 0525   Sodium  Date/Time Value Range Status  12/03/2011  5:25 AM 138  135 - 145 mEq/L Final  12/02/2011  5:00 AM 137  135 - 145 mEq/L Final  12/01/2011  5:00 AM 136  135 - 145 mEq/L Final    Potassium  Date/Time Value Range Status  12/03/2011  5:25 AM 3.9  3.5 - 5.1 mEq/L Final  12/02/2011  5:00 AM 3.9  3.5 - 5.1 mEq/L Final  12/01/2011  5:00 AM 3.6  3.5 - 5.1 mEq/L Final    Phosphorus  Date/Time Value Range Status  12/03/2011  5:25 AM 2.0* 2.3 - 4.6 mg/dL Final  1/61/0960  4:54 AM 1.5* 2.3 - 4.6 mg/dL Final  0/98/1191  4:78 AM 2.8  2.3 - 4.6 mg/dL Final    Magnesium  Date/Time Value Range Status  12/01/2011  5:00 AM 2.0  1.5 - 2.5 mg/dL Final  05/02/5619  3:08 AM 1.9  1.5 - 2.5 mg/dL Final  08/25/7844  9:62 AM 1.6  1.5 - 2.5 mg/dL Final     Intake/Output  Summary (Last 24 hours) at 12/03/11 1215 Last data filed at 12/02/11 1400  Gross per 24 hour  Intake    120 ml  Output      0 ml  Net    120 ml  BM 9/11  Weight Status:  133 lb s/p HD on 9/12 (arrived at 160 lb)  Re-estimated needs (based on need for HD):  1650 - 1900 kcal, 70 - 85 grams protein, 1.2 liters/d  Nutrition Dx:  Inadequate oral intake now r/t decreased attention and lethargy AEB very poor PO intake.  Goal:  Intake to meet >90% of estimated nutrition needs - unmet  Monitor:  Weights, labs, PO intake, I/O's  Jarold Motto MS, RD, LDN Pager: [phone] After-hours pager: [phone]

## 2011-12-03 NOTE — Progress Notes (Signed)
OT Cancellation Note  Treatment cancelled today due to fatigue and sleepiness.  Pt requesting deferral today.  Will continue to follow.  [name] 12/03/2011, 2:44 PM

## 2011-12-03 NOTE — Consult Note (Signed)
Physical Medicine and Rehabilitation Consult Reason for Consult: Deconditioned Referring Physician:  Dr. Sung Amabile   HPI: Heather Sims is a 21 y.o. female withhistory of C3 nephropathy, anoxic brain injury ( well known to CIR from prior stay), seizure, cardiomyopathy, hypertension with history of somnolence 4-5 days PTA and  found to be hypoxic by [facility]. Admitted for workup on 09/05/.  Patient's ammonia level was found to be high one week PTA by patient's neurologist and was started on Keppra and the plan was to taper off Depakote. Work up revealed elevated Depakote, chest x-ray shows infiltrates concerning for pneumonia and tense ascites with increased lower extremity fluid. HCAP treated with IV antibiotics. Renal consulted for input and recommended dialysis for acute on chronic renal failure and volume overload.  Patient underwent 10 L paracentesis but developed worsening of hypotension with decreased MS and required intubation on 09/07. She developed ABLA due to intra-abdominal bleed with hgb 1.3 with thrombocytopenia and transfused with 8 U pRBC, 4  plts, and 4 FFP. Extubated on 09/10 and CVVH stopped with plans for intermittent HD. Therapy evaluations initiated yesterday and  MD, OT, ST recommending CIR.    Review of Systems  Respiratory: Positive for shortness of breath (with activity).   Cardiovascular: Negative for chest pain and palpitations.  Gastrointestinal: Positive for nausea. Negative for abdominal pain.       Sore mouth affecting appetite  Neurological: Positive for seizures.  Psychiatric/Behavioral: The patient is nervous/anxious and has insomnia.   All other systems reviewed and are negative.   Past Medical History  Diagnosis Date  . Pneumonia     'walking pneumonia'  . Seizures   . Anemia   . Heart attack   . Cardiomyopathy   . Heart failure   . CHF (congestive heart failure)   . Ileitis   . Chronic kidney disease   . Renal disorder   . MPGN (membranoproliferative  glomerulonephritis), type 2   . Hypertension   . Brain injury    Past Surgical History  Procedure Date  . Renal biopsy    Family History  Problem Relation Age of Onset  . Hypertension Mother   . Diabetes Father    Social History: Single. Lives with mother and required supervision PTA. She reports she was ambulating with HHPT.  Per reports that she has never smoked. She has never used smokeless tobacco. She reports that she does not drink alcohol or use illicit drugs.   Allergies: No Known Allergies  Medications Prior to Admission  Medication Sig Dispense Refill  . amLODipine (NORVASC) 10 MG tablet Take 10 mg by mouth daily.      . carvedilol (COREG) 25 MG tablet Take 25 mg by mouth 2 (two) times daily with a meal.      . clonazePAM (KLONOPIN) 0.5 MG tablet Take 0.5 mg by mouth 2 (two) times daily.      . darbepoetin (ARANESP) 60 MCG/0.3ML SOLN Inject 60 mcg into the skin every Wednesday at 6 PM.      . divalproex (DEPAKOTE) 250 MG DR tablet Take 750 mg by mouth 3 (three) times daily.      . ferrous sulfate 325 (65 FE) MG tablet Take 325 mg by mouth 3 (three) times daily.      . furosemide (LASIX) 80 MG tablet Take 240 mg by mouth 2 (two) times daily.      Marland Kitchen levETIRAcetam (KEPPRA) 250 MG tablet Take 250 mg by mouth 2 (two) times daily.      Marland Kitchen  losartan (COZAAR) 100 MG tablet Take 100 mg by mouth daily.      . sodium bicarbonate 650 MG tablet Take 650 mg by mouth 3 (three) times daily.        Home: Home Living Lives With: Family (mother) Available Help at Discharge: Family;Available 24 hours/day Type of Home: House Home Access: Stairs to enter [employer] of Steps: 2 Home Layout: One level Bathroom Shower/Tub: Tub/shower unit;Curtain Bathroom Toilet: Standard Home Adaptive Equipment: Bedside commode/3-in-1;Tub transfer bench;Walker - rolling;Wheelchair - manual  Functional History: Prior Function Bath: Minimal Toileting: Minimal Dressing: Minimal Meal Prep:  Total Light Housekeeping: Total Able to Take Stairs?: No Driving: No Vocation: On disability Functional Status:  Mobility: Bed Mobility Bed Mobility: Rolling Left;Left Sidelying to Sit;Sitting - Scoot to [employer] of Bed Rolling Left: 4: Min assist Left Sidelying to Sit: 3: Mod assist Sitting - Scoot to Edge of Bed: 2: Max assist Transfers Transfers: Sit to Stand;Stand to [employer] Transfers Sit to Stand: 1: +2 Total assist;With upper extremity assist;From bed Sit to Stand: Patient Percentage: 50% Stand to Sit: 1: +2 Total assist;With upper extremity assist;To chair/3-in-1 Stand to Sit: Patient Percentage: 30% Stand Pivot Transfers: 1: +2 Total assist Stand Pivot Transfers: Patient Percentage: 30% Ambulation/Gait Ambulation/Gait Assistance: Not tested (comment) Stairs: No Wheelchair Mobility Wheelchair Mobility: No  ADL: ADL Grooming: Performed;Wash/dry face;Supervision/safety;Set up Where Assessed - Grooming: Supported sitting Lower Body Bathing: Simulated;+2 Total assistance Where Assessed - Lower Body Bathing: Supported sit to stand Upper Body Dressing: Performed;Moderate assistance Where Assessed - Upper Body Dressing: Supported sitting Lower Body Dressing: Performed;+2 Total assistance Where Assessed - Lower Body Dressing: Supported sit to Pharmacist, hospital: Simulated;+2 Total assistance Toilet Transfer Method: Surveyor, minerals:  (bed to chair) Equipment Used: Gait belt Transfers/Ambulation Related to ADLs: need for bil knee blocking.   Cognition: Cognition Arousal/Alertness: Awake/alert Orientation Level: Oriented X4 Cognition Overall Cognitive Status: Impaired Area of Impairment: Attention;Memory;Following commands;Safety/judgement;Awareness of deficits;Problem solving Arousal/Alertness: Awake/alert Orientation Level: Appears intact for tasks assessed Behavior During Session: Flat affect Current Attention Level: Sustained Following  Commands: Follows one step commands consistently;Follows one step commands with increased time Safety/Judgement: Decreased awareness of need for assistance  Blood pressure 143/98, pulse 100, temperature 98.2 F (36.8 C), temperature source Oral, resp. rate 12, height 5\' 2"  (1.575 m), weight 64.9 kg (143 lb 1.3 oz), last menstrual period 11/30/2011, SpO2 96.00%. Physical Exam  Nursing note and vitals reviewed. Constitutional: She is oriented to person, place, and time. She appears well-developed. She has a sickly appearance.  HENT:  Head: Normocephalic and atraumatic.  Eyes: Pupils are equal, round, and reactive to light.  Neck: Normal range of motion.  Cardiovascular: Normal rate and regular rhythm.   Pulmonary/Chest: Effort normal. She has rales.  Abdominal: Soft. Bowel sounds are normal. There is no tenderness.  Musculoskeletal: She exhibits no edema.  Neurological: She is alert and oriented to person, place, and time. She displays abnormal reflex. Coordination abnormal.       Flat affect with slow hesitant  simple speech.  Follows basic commands without difficulty. Limited insight and awareness.  BLE spastic with flexor withdrawal and few beats clonus bilaterally. Strength grossly 2-3/5.     Results for orders placed during the hospital encounter of 11/26/11 (from the past 24 hour(s))  GLUCOSE, CAPILLARY     Status: Normal   Collection Time   12/02/11 11:55 AM      Component Value Range   Glucose-Capillary 86  70 - 99  mg/dL  GLUCOSE, CAPILLARY     Status: Abnormal   Collection Time   12/02/11  4:38 PM      Component Value Range   Glucose-Capillary 108 (*) 70 - 99 mg/dL  GLUCOSE, CAPILLARY     Status: Normal   Collection Time   12/02/11  9:40 PM      Component Value Range   Glucose-Capillary 91  70 - 99 mg/dL   Comment 1 Documented in Chart     Comment 2 Notify RN    CBC     Status: Abnormal   Collection Time   12/03/11  5:25 AM      Component Value Range   WBC 11.9 (*) 4.0 -  10.5 K/uL   RBC 4.51  3.87 - 5.11 MIL/uL   Hemoglobin 13.2  12.0 - 15.0 g/dL   HCT 16.1  09.6 - 04.5 %   MCV 87.8  78.0 - 100.0 fL   MCH 29.3  26.0 - 34.0 pg   MCHC 33.3  30.0 - 36.0 g/dL   RDW 40.9  81.1 - 91.4 %   Platelets 76 (*) 150 - 400 K/uL  RENAL FUNCTION PANEL     Status: Abnormal   Collection Time   12/03/11  5:25 AM      Component Value Range   Sodium 138  135 - 145 mEq/L   Potassium 3.9  3.5 - 5.1 mEq/L   Chloride 104  96 - 112 mEq/L   CO2 27  19 - 32 mEq/L   Glucose, Bld 81  70 - 99 mg/dL   BUN 16  6 - 23 mg/dL   Creatinine, Ser 7.82 (*) 0.50 - 1.10 mg/dL   Calcium 8.2 (*) 8.4 - 10.5 mg/dL   Phosphorus 2.0 (*) 2.3 - 4.6 mg/dL   Albumin 1.4 (*) 3.5 - 5.2 g/dL   GFR calc non Af Amer 34 (*) >90 mL/min   GFR calc Af Amer 40 (*) >90 mL/min   Dg Chest Port 1 View  12/02/2011  *RADIOLOGY REPORT*  Clinical Data: Dyspnea.  Status post extubation.  PORTABLE CHEST - 1 VIEW  Comparison: Chest x-ray 12/01/2011.  Findings: Previously noted endotracheal and nasogastric tubes have been removed. There is a left-sided internal jugular central venous catheter with tip terminating in the proximal superior vena cava. A right internal jugular PermCath with the tips terminating at the superior cavoatrial junction and in the distal superior vena cava. Lung volumes are low with persistent bibasilar opacities (left much greater than right), which may represent areas of atelectasis and/or consolidation.  Pulmonary venous congestion, without frank pulmonary edema.  Small left pleural effusion.  Mild enlargement of the cardiopericardial silhouette is again noted. The patient is rotated to the right on today's exam, resulting in distortion of the mediastinal contours and reduced diagnostic sensitivity and specificity for mediastinal pathology.  IMPRESSION: 1.  Support apparatus, as above. 2.  Persistent low lung volumes with persistent bibasilar opacities (left much greater than right).  Left lower lobe  consolidation (from infection or aspiration) is suspected, while there is also likely some bibasilar subsegmental atelectasis, and a small left pleural effusion. 3.  Mild pulmonary venous congestion, without frank pulmonary edema.   Original Report Authenticated By: Florencia Reasons, M.D.     Assessment/Plan: Diagnosis: hx of anoxic encephalopathy, metabolic encephalopathy, severe deconditioning related to multiple medical  1. Does the need for close, 24 hr/day medical supervision in concert with the patient's rehab needs make it unreasonable for  this patient to be served in a less intensive setting? Yes 2. Co-Morbidities requiring supervision/potential complications: see above 3. Due to bladder management, bowel management, safety, skin/wound care, disease management, medication administration, pain management and patient education, does the patient require 24 hr/day rehab nursing? Potentially 4. Does the patient require coordinated care of a physician, rehab nurse, PT (1-2 hrs/day, 5 days/week), OT (1-2 hrs/day, 5 days/week) and SLP (1-2 hrs/day, 5 days/week) to address physical and functional deficits in the context of the above medical diagnosis(es)? Potentially Addressing deficits in the following areas: balance, endurance, locomotion, strength, transferring, bowel/bladder control, bathing, dressing, feeding, grooming, toileting, cognition, speech, language, swallowing and psychosocial support 5. Can the patient actively participate in an intensive therapy program of at least 3 hrs of therapy per day at least 5 days per week? Potentially 6. The potential for patient to make measurable gains while on inpatient rehab is good and fair 7. Anticipated functional outcomes upon discharge from inpatient rehab are ?min assist to supervision with PT, min assist to mod asssit with OT, supervision to min assist with SLP. 8. Estimated rehab length of stay to reach the above functional goals is: TBD 9. Does  the patient have adequate social supports to accommodate these discharge functional goals? Yes 10. Anticipated D/C setting: Home 11. Anticipated post D/C treatments: HH therapy 12. Overall Rehab/Functional Prognosis: good and fair  RECOMMENDATIONS: This patient's condition is appropriate for continued rehabilitative care in the following setting: CIR vs SNF Patient has agreed to participate in recommended program. Potentially Note that insurance prior authorization may be required for reimbursement for recommended care.  Comment: Pt is well know to our service. She has declined significantly since i saw her in the office earlier this year. She did not recall seeing me before which is an indicator of her substantial cognitive dysfunction. Will follow along for now for medical and functional progress.  Ivory Broad, MD    12/03/2011

## 2011-12-03 NOTE — Progress Notes (Signed)
SLP Cancellation Note  Treatment cancelled today due to patient receiving procedure or test. Patient in HD. Will f/u.   [name] 12/03/2011, 11:24 AM

## 2011-12-03 NOTE — Progress Notes (Signed)
[facility] ROUNDING NOTE   Subjective:   Interval History: 9/8 Paracentesis removed 10 L, hemodynamic instability and bleeding, intubated with bear hugger 9/9 CVVH started, pressors off, BP stable 9/10 Extubated, CVVH stopped, intermittent dialysis started  The patient is awake this morning and not having any complaints. She states her breathing is fine and her belly is not tender. She has not been up to the chair since arriving on 2500. She will get dialysis later this morning. No complaints and no needs at this time.   Objective:  Vital signs in last 24 hours:  Temp:  [98.7 F (37.1 C)-99.4 F (37.4 C)] 98.9 F (37.2 C) (09/12 0445) Pulse Rate:  [82-100] 87  (09/12 0445) Resp:  [10-29] 16  (09/12 0445) BP: (126-200)/(91-138) 142/97 mmHg (09/12 0445) SpO2:  [92 %-100 %] 97 % (09/12 0445) Weight:  [147 lb 7.8 oz (66.9 kg)] 147 lb 7.8 oz (66.9 kg) (09/12 0000)  Weight change: -8 lb 9.6 oz (-3.9 kg) Filed Weights   12/02/11 0003 12/02/11 0331 12/03/11 0000  Weight: 156 lb 1.4 oz (70.8 kg) 150 lb 2.1 oz (68.1 kg) 147 lb 7.8 oz (66.9 kg)    Intake/Output: I/O last 3 completed shifts: In: 670 [P.O.:120; I.V.:550] Out: 3195 [Urine:95; Other:3100]    PE: General: resting in bed, no acute distress HEENT: PERRL, EOMI, no scleral icterus Cardiac: RRR, no rubs, murmurs or gallops Pulm: moving normal volumes of air, no wheezes or rales appreciated Abd: soft, nontender, distended, BS present Ext: warm and well perfused, no pedal edema Neuro: alert and oriented X2 (not to date), cranial nerves II-XII grossly intact  Basic Metabolic Panel:  Lab 12/03/11 1610 12/02/11 0500 12/01/11 0500 11/30/11 1538 11/30/11 0510 11/29/11 0224  NA 138 137 136 137 137 --  K 3.9 3.9 3.6 4.1 3.9 --  CL 104 103 103 102 102 --  CO2 27 28 29 30 27  --  GLUCOSE 81 105* 92 91 87 --  BUN 16 8 13 17 22  --  CREATININE 2.01* 1.18* 1.28* 1.51* 1.77* --  CALCIUM 8.2* 8.1* 7.5* -- -- --  MG  -- -- 2.0 -- 1.9 1.6  PHOS 2.0* 1.5* 2.8 3.5 4.1 --    Liver Function Tests:  Lab 12/03/11 0525 12/02/11 0500 12/01/11 2330 12/01/11 0500 11/30/11 1538 11/30/11 0510 11/29/11 0224 11/28/11 2230 11/27/11 0330 11/26/11 1718  AST -- -- -- -- -- -- 56* 13 24 23   ALT -- -- 6 -- -- -- 15 <5 <5 <5  ALKPHOS -- -- -- -- -- -- 62 17* 38* 39  BILITOT -- -- -- -- -- -- 0.4 0.1* 0.1* 0.2*  PROT -- -- -- -- -- -- 6.1 2.7* 6.3 6.5  ALBUMIN 1.4* 1.5* -- 1.4* 1.6* 1.8* -- -- -- --   CBC:  Lab 12/03/11 0525 12/02/11 0500 12/01/11 0500 11/30/11 0510 11/29/11 1600 11/28/11 2230 11/27/11 0249  WBC 11.9* 12.1* 12.2* 14.2* 6.7 -- --  NEUTROABS -- -- -- -- -- 3.5 3.8  HGB 13.2 12.1 10.8* 13.6 12.0 -- --  HCT 39.6 36.2 32.5* 39.1 34.4* -- --  MCV 87.8 87.4 88.8 84.8 83.5 -- --  PLT 76* 42* 32* 41* 36* -- --    Cardiac Enzymes:  Lab 11/29/11 1200 11/29/11 0251 11/28/11 2051  CKTOTAL -- -- 54  CKMB -- -- 1.9  CKMBINDEX -- -- --  TROPONINI <0.30 <0.30 <0.30   CBG:  Lab 12/02/11 2140 12/02/11 1638 12/02/11 1155 12/02/11 0805 12/02/11 0357  GLUCAP 91 108* 86 84 102*   Coagulation Studies: No results found for this basename: LABPROT:5,INR:5 in the last 72 hours Imaging: Dg Chest Port 1 View  12/02/2011  *RADIOLOGY REPORT*  Clinical Data: Dyspnea.  Status post extubation.  PORTABLE CHEST - 1 VIEW  Comparison: Chest x-ray 12/01/2011.  Findings: Previously noted endotracheal and nasogastric tubes have been removed. There is a left-sided internal jugular central venous catheter with tip terminating in the proximal superior vena cava. A right internal jugular PermCath with the tips terminating at the superior cavoatrial junction and in the distal superior vena cava. Lung volumes are low with persistent bibasilar opacities (left much greater than right), which may represent areas of atelectasis and/or consolidation.  Pulmonary venous congestion, without frank pulmonary edema.  Small left pleural effusion.  Mild  enlargement of the cardiopericardial silhouette is again noted. The patient is rotated to the right on today's exam, resulting in distortion of the mediastinal contours and reduced diagnostic sensitivity and specificity for mediastinal pathology.  IMPRESSION: 1.  Support apparatus, as above. 2.  Persistent low lung volumes with persistent bibasilar opacities (left much greater than right).  Left lower lobe consolidation (from infection or aspiration) is suspected, while there is also likely some bibasilar subsegmental atelectasis, and a small left pleural effusion. 3.  Mild pulmonary venous congestion, without frank pulmonary edema.   Original Report Authenticated By: Florencia Reasons, M.D.      Medications:      . sodium chloride Stopped (11/29/11 1317)  . DISCONTD: dextrose 50 mL/hr at 12/02/11 0631  . DISCONTD: fentaNYL infusion INTRAVENOUS Stopped (12/01/11 0900)      . carvedilol  25 mg Oral BID WC  . darbepoetin  60 mcg Subcutaneous Q Wed-1800  . levETIRAcetam  250 mg Oral BID  . DISCONTD: darbepoetin  60 mcg Subcutaneous Q Wed-1800  . DISCONTD: feeding supplement (NEPRO CARB STEADY)  1,000 mL Per Tube Q24H  . DISCONTD: levetiracetam  250 mg Intravenous Q12H  . DISCONTD: pantoprazole (PROTONIX) IV  40 mg Intravenous Q24H   sodium chloride, sodium chloride, acetaminophen, camphor-menthol, diphenhydrAMINE, heparin, hydrALAZINE, hydrOXYzine, lidocaine, lidocaine-prilocaine, ondansetron (ZOFRAN) IV, ondansetron, pentafluoroprop-tetrafluoroeth, sodium chloride, DISCONTD: feeding supplement (NEPRO CARB STEADY), DISCONTD: fentaNYL, DISCONTD: hydrALAZINE, DISCONTD: midazolam  Assessment/ Plan:   1.Acute oliguric renal failure on background of Chronic renal failure - Switched to intermittent dialysis and will be dialyzed today. Electrolytes are fine today, Cr has jumped to 2 and urine output was 25 cc yesterday and likely will be HD bound for life.  -HD today  2. HTN - BP in 140s/90s.  Will follow but likely needs better control.   3. Ventilator dependent respiratory failure - Resolved.  4. Anoxic Brain Injury following PEA arrest - Stable, chronic.   5. Cardiomyopathy. Repeat 2D Echo ordered with no change from baseline.  6. MPGN with C3 nephropathy - Aranesp continue. Holding losartan.   7. Seizure disorder - Keppra PO.   [facility] hospitalist primary and will follow closely.    LOS: 7 Genella Mech, MD 12/03/2011,7:28 AM

## 2011-12-03 NOTE — Progress Notes (Signed)
I have seen and examined this patient and agree with plan as outlined by Dr. Dorise Hiss.  Will ask [facility] to eval for PC and AVF/AVG placement, now that her platelets are improving. Barbette Mcglaun A,MD 12/03/2011 8:50 AM

## 2011-12-03 NOTE — Progress Notes (Signed)
SLP Cancellation Note  Treatment cancelled today due to patient lethargic, politely refused treatment this pm. Will f/u 9/13. Ferdinand Lango MA, CCC-SLP [phone]   Emrie Gayle [name] 12/03/2011, 2:39 PM

## 2011-12-04 ENCOUNTER — Encounter (HOSPITAL_COMMUNITY)
Admission: EM | Disposition: A | Discharge status: Other Acute Inpatient Hospital | Payer: Self-pay | Source: Home / Self Care | Attending: Pulmonary Disease

## 2011-12-04 LAB — RENAL FUNCTION PANEL
Albumin: 1.6 g/dL — ABNORMAL LOW (ref 3.5–5.2)
Chloride: 106 mEq/L (ref 96–112)
GFR calc non Af Amer: 32 mL/min — ABNORMAL LOW (ref >90)
Potassium: 3.8 mEq/L (ref 3.5–5.1)

## 2011-12-04 LAB — CBC
Platelets: 118 10*3/uL — ABNORMAL LOW (ref 150–400)
RBC: 4 MIL/uL (ref 3.87–5.11)
WBC: 12.4 10*3/uL — ABNORMAL HIGH (ref 4.0–10.5)

## 2011-12-04 LAB — EXPECTORATED SPUTUM ASSESSMENT W GRAM STAIN, RFLX TO RESP C

## 2011-12-04 LAB — GLUCOSE, CAPILLARY
Glucose-Capillary: 85 mg/dL (ref 70–99)
Glucose-Capillary: 83 mg/dL (ref 70–99)
Glucose-Capillary: 82 mg/dL (ref 70–99)
Glucose-Capillary: 84 mg/dL (ref 70–99)

## 2011-12-04 SURGERY — INSERTION OF DIALYSIS CATHETER
Anesthesia: Monitor Anesthesia Care

## 2011-12-04 MED ORDER — LISINOPRIL 20 MG PO TABS
20.0000 mg | ORAL_TABLET | Freq: Every day | ORAL | Status: DC
  Administered 2011-12-05: 20 mg via ORAL
  Filled 2011-12-04 (×2): qty 1

## 2011-12-04 MED ORDER — CARVEDILOL 12.5 MG PO TABS
12.5000 mg | ORAL_TABLET | Freq: Two times a day (BID) | ORAL | Status: DC
  Administered 2011-12-04 – 2011-12-09 (×9): 12.5 mg via ORAL
  Filled 2011-12-04 (×12): qty 1

## 2011-12-04 NOTE — Progress Notes (Signed)
Physical Therapy Treatment Patient Details Name: Heather Sims MRN: [id number] DOB: 01/26/1991 Today's Date: 12/04/2011 Time: 4098-1191 PT Time Calculation (min): 17 min  PT Assessment / Plan / Recommendation Comments on Treatment Session  PAtient s/p encephalopathy and multiple medical problems with decr mobility secondary to decr endurance for activity as well as decr safety due to lack of safety awareness.  Will benefit from  continued PT to address mobility issues.      Follow Up Recommendations  Inpatient Rehab    Barriers to Discharge        Equipment Recommendations  Defer to next venue    Recommendations for Other Services Rehab consult  Frequency Min 3X/week   Plan Discharge plan remains appropriate;Frequency remains appropriate    Precautions / Restrictions Precautions Precautions: Fall Restrictions Weight Bearing Restrictions: No   Pertinent Vitals/Pain VSS, No pain    Mobility  Bed Mobility Bed Mobility: Rolling Right;Right Sidelying to Sit;Sitting - Scoot to [place] of Bed;Sit to Supine Rolling Right: 4: Min assist;With rail Rolling Left: Not tested (comment) Right Sidelying to Sit: 3: Mod assist;With rails;HOB flat Left Sidelying to Sit: Not tested (comment) Sitting - Scoot to Edge of Bed: 2: Max assist Sit to Supine: 1: +2 Total assist Sit to Supine: Patient Percentage: 60% Details for Bed Mobility Assistance: cues for sequencing and safe technique.  PAtient needed incr time to process and initiate tasks.  Incr tone in LEs when bringing them off and on bed.   Transfers Transfers: Not assessed Details for Transfer Assistance: Patient declined standing Ambulation/Gait Ambulation/Gait Assistance: Not tested (comment) Stairs: No Wheelchair Mobility Wheelchair Mobility: No    Exercises General Exercises - Lower Extremity Long Arc Quad: AAROM;Both;5 reps;Seated   PT Goals Acute Rehab PT Goals PT Goal: Supine/Side to Sit - Progress: Progressing toward  goal Pt will Sit at [facility] of Bed: with modified independence;6-10 min;with no upper extremity support PT Goal: Sit at Edge Of Bed - Progress: Goal set today PT Goal: Sit to Supine/Side - Progress: Progressing toward goal  Visit Information  Last PT Received On: 12/04/11 Assistance Needed: +2    Subjective Data  Subjective: "I will try."   Cognition  Overall Cognitive Status: Impaired Area of Impairment: Attention;Memory;Following commands;Safety/judgement;Awareness of deficits Arousal/Alertness: Awake/alert Orientation Level: Appears intact for tasks assessed Behavior During Session: Flat affect Current Attention Level: Sustained Following Commands: Follows one step commands consistently;Follows one step commands with increased time Safety/Judgement: Decreased awareness of need for assistance    Balance  Static Sitting Balance Static Sitting - Balance Support: Bilateral upper extremity supported;Feet supported Static Sitting - Level of Assistance: 5: Stand by assistance;3: Mod assist Static Sitting - Comment/# of Minutes: Initially needed mod assist to maintain balance and prevent posterior lean, but with cuing and time, patient self corrects and becomes supervision.   Dynamic Sitting Balance Dynamic Sitting - Balance Support: Feet supported;During functional activity Dynamic Sitting - Level of Assistance: 5: Stand by assistance Reach (Patient is able to reach ___ inches to right, left, forward, back): able to reach 4-5 inches all directions with easier time reaching with right UE all directions  Dynamic Sitting - Balance Activities: Lateral lean/weight shifting;Forward lean/weight shifting;Reaching for objects;Reaching across midline;Trunk control activities Dynamic Sitting - Comments: Overall sat a total of 8 minutes at EOB performing reaching activities and some LE exercise.  End of Session PT - End of Session Equipment Utilized During Treatment: Gait belt Activity Tolerance:  Patient limited by fatigue Patient left: in bed;with call bell/phone  within reach;with family/visitor present Nurse Communication: Mobility status       INGOLD,Casimiro Lienhard 12/04/2011, 11:54 AM  [facility] [phone] (pager)

## 2011-12-04 NOTE — Progress Notes (Signed)
Dr Butler Denmark paged regarding sb heart rate as low as 49.

## 2011-12-04 NOTE — Progress Notes (Signed)
Paged Rubie Maid, NP regarding low heart rate; page returned and instructed to hold Coreg for this morning.

## 2011-12-04 NOTE — Progress Notes (Signed)
SLP Cancellation Note  Treatment cancelled today due to patient receiving procedure or test.  Patient NPO after midnight for tunneled HD cath later today. Will continue to f/u.   Ferdinand Lango MA, CCC-SLP [phone]    Ferdinand Lango [name] 12/04/2011, 8:32 AM

## 2011-12-04 NOTE — Progress Notes (Signed)
[facility] ROUNDING NOTE   Subjective:   Interval History: 9/8 Paracentesis removed 10 L, hemodynamic instability and bleeding, intubated with bear hugger 9/9 CVVH started, pressors off, BP stable 9/10 Extubated, CVVH stopped, intermittent dialysis started  The patient is sleeping this morning and NPO after midnight for tunneled HD cath later today. She is not having any pain and is not having any problems breathing. She did not have any therapy yesterday given sleepiness after HD. She had more fluid taken off yesterday in HD. She was evaluated for CIR yesterday and may qualify.   Objective:  Vital signs in last 24 hours:  Temp:  [98 F (36.7 C)-100.7 F (38.2 C)] 99.8 F (37.7 C) (09/13 0429) Pulse Rate:  [90-115] 97  (09/13 0429) Resp:  [10-46] 19  (09/13 0429) BP: (119-162)/(79-111) 148/109 mmHg (09/13 0642) SpO2:  [95 %-99 %] 95 % (09/13 0429) Weight:  [133 lb 9.6 oz (60.6 kg)-143 lb 1.3 oz (64.9 kg)] 134 lb 4.2 oz (60.9 kg) (09/13 0000)  Weight change: -4 lb 6.5 oz (-2 kg) Filed Weights   12/03/11 0800 12/03/11 1205 12/04/11 0000  Weight: 143 lb 1.3 oz (64.9 kg) 133 lb 9.6 oz (60.6 kg) 134 lb 4.2 oz (60.9 kg)   PE: General: resting in bed, no acute distress HEENT: PERRL, EOMI, no scleral icterus Cardiac: RRR, no rubs, murmurs or gallops Pulm: moving normal volumes of air, no wheezes or rales appreciated Abd: soft, nontender, mildly distended, BS present Ext: warm and well perfused, no pedal edema Neuro: alert and oriented X2 (not to date), cranial nerves II-XII grossly intact  Basic Metabolic Panel:  Lab 12/04/11 9604 12/03/11 0525 12/02/11 0500 12/01/11 0500 11/30/11 1538 11/30/11 0510 11/29/11 0224  NA 141 138 137 136 137 -- --  K 3.8 3.9 3.9 3.6 4.1 -- --  CL 106 104 103 103 102 -- --  CO2 28 27 28 29 30  -- --  GLUCOSE 79 81 105* 92 91 -- --  BUN 14 16 8 13 17  -- --  CREATININE 2.10* 2.01* 1.18* 1.28* 1.51* -- --  CALCIUM 8.5 8.2* 8.1* -- -- --  --  MG -- -- -- 2.0 -- 1.9 1.6  PHOS 1.9* 2.0* 1.5* 2.8 3.5 -- --    Liver Function Tests:  Lab 12/04/11 0420 12/03/11 0525 12/02/11 0500 12/01/11 2330 12/01/11 0500 11/30/11 1538 11/29/11 0224 11/28/11 2230  AST -- -- -- -- -- -- 56* 13  ALT -- -- -- 6 -- -- 15 <5  ALKPHOS -- -- -- -- -- -- 62 17*  BILITOT -- -- -- -- -- -- 0.4 0.1*  PROT -- -- -- -- -- -- 6.1 2.7*  ALBUMIN 1.6* 1.4* 1.5* -- 1.4* 1.6* -- --   CBC:  Lab 12/04/11 0420 12/03/11 0525 12/02/11 0500 12/01/11 0500 11/30/11 0510 11/28/11 2230  WBC 12.4* 11.9* 12.1* 12.2* 14.2* --  NEUTROABS -- -- -- -- -- 3.5  HGB 11.7* 13.2 12.1 10.8* 13.6 --  HCT 36.0 39.6 36.2 32.5* 39.1 --  MCV 90.0 87.8 87.4 88.8 84.8 --  PLT 118* 76* 42* 32* 41* --    Cardiac Enzymes:  Lab 11/29/11 1200 11/29/11 0251 11/28/11 2051  CKTOTAL -- -- 54  CKMB -- -- 1.9  CKMBINDEX -- -- --  TROPONINI <0.30 <0.30 <0.30   CBG:  Lab 12/03/11 2053 12/03/11 1633 12/03/11 1239 12/02/11 2140 12/02/11 1638  GLUCAP 85 88 76 91 108*  Imaging: Dg Chest 2 View  12/03/2011  *  RADIOLOGY REPORT*  Clinical Data: Cough  CHEST - 2 VIEW  Comparison: Yesterday  Findings: Left internal jugular vein vascular catheter has been removed.  Right internal jugular dialysis catheters stable. Cardiomegaly.  Bibasilar atelectasis and low lung volumes.  Small left pleural effusion.  Normal vascularity.  Bibasilar atelectasis has improved compared with yesterday.  IMPRESSION: Improved bibasilar atelectasis.  Small left pleural effusion.   Original Report Authenticated By: Donavan Burnet, M.D.      Medications:      . sodium chloride Stopped (11/29/11 1317)      . carvedilol  25 mg Oral BID WC  . cefUROXime (ZINACEF)  IV  1.5 g Intravenous On Call to OR  . darbepoetin  60 mcg Subcutaneous Q Wed-1800  . feeding supplement (NEPRO CARB STEADY)  237 mL Oral BID BM  . levETIRAcetam  250 mg Oral BID  . lisinopril  10 mg Oral Daily   sodium chloride, sodium chloride,  acetaminophen, camphor-menthol, diphenhydrAMINE, heparin, hydrALAZINE, hydrOXYzine, lidocaine, lidocaine-prilocaine, ondansetron (ZOFRAN) IV, ondansetron, pentafluoroprop-tetrafluoroeth, sodium chloride  Assessment/ Plan:   1.Acute oliguric renal failure on background of Chronic renal failure - Switched to intermittent dialysis and got dialysis yesterday. Electrolytes are fine today, Cr has stabilized at 2 and urine output was not charted yesterday but had been declining. Likely will be HD bound for life. Vascular did evaluate yesterday and would like to wait until next week when she is stronger to start AVG as her veins are very small (vein study in July) and could not have AVF.  -no HD today -Tunneled HD cath today  2. HTN - BP in 140s/90s. Will follow but likely needs better control. ACE-I was added by primary team although patient was on losartan at home.   3. Ventilator dependent respiratory failure - Resolved.  4. Anoxic Brain Injury following PEA arrest - Stable, chronic.   5. Cardiomyopathy. Repeat 2D Echo ordered with no change from baseline.  6. MPGN with C3 nephropathy - Aranesp continue.   7. Seizure disorder - Keppra PO.   [facility] hospitalist primary and will follow closely.    LOS: 8 Genella Mech, MD 12/04/2011,7:47 AM

## 2011-12-04 NOTE — Progress Notes (Signed)
VASCULAR SURGERY  Will try to schedule Left AVF/AVG early next week when she is medically ready. Based on vein map, she will likely require an AVG.  Di Kindle. Edilia Bo, MD, FACS Beeper [phone] 12/04/2011

## 2011-12-04 NOTE — Progress Notes (Signed)
I have seen and examined this patient and agree with plan as oultined by Dr. Dorise Hiss.  Pt's ascites is improved but still present.  Continue with IHD and plan for another HD session tomorrow.  Awaiting AVG placement per [facility].  Agree with ACE-I and increase dose as pt is now ESRD.  Await CIR decision. Atalia Litzinger A,MD 12/04/2011 11:45 AM

## 2011-12-04 NOTE — Progress Notes (Addendum)
[facility] PROGRESS NOTE  Heather Sims WUJ:[id number] DOB: [birth year] DOA: 11/26/2011 PCP: Jaclyn Shaggy, MD  Assessment/Plan: Active Problems:  Anemia presumed to be due to acute intra-abdominal blood loss as a complication of paracentesis S/p transfusion of 8 U prbc, Hgb was noted to jump from 1.3 to 14.6 in a matter of 4 hrs- I am highly suspicious of the accuracy of the Hgb of 1.3. Prior to this her Hgb was 9.0 - Hgb has since been stable.   Hypotension/ shock Likely due to fluid shifts from large volume paracentesis- resolved with volume resusitation  Acute Oliguric Renal failure on chronic renal failure- MPGN Type 2 dialysis per nephrology via dialysis catheter - Per Dr Edilia Bo, she will need a graft in left arm rather than a fistula   Fever 100.7 on evening of 9/12- follow fever curve- WBC count increased slightly from yesterday - will follow trend- was given Cefuroxime today prior to placement of tunnelled dialysis cath  Bradycardia Short lived episode earlier today- will cut back on Coreg and cont to monitor on tele for further bradycardia  Ascites Re-accumulation after large volume paracentesis- no signs of cirrhosis and therefore likely due to progression of renal failure and hypoalbuminemia  Seizure disorder with Depakote toxicity  Depakote has been discontinued and the patient is receiving Keppra   Cardiomyopathy  Ejection fraction 40-45% via echocardiogram January 2013 - repeat ECHO reveals progression of LV and RV systolic function-  EF of 30-35% and moderate concentric hypertrophy of LV, mild to moderately reduced RV function- B blocker ordered- due to bradycardic episode,  Coreg has been decreased for now- cont to follow.  Added ACE I  Congested cough/ infiltrate in LLL? Obtain PA/Lat CXR and sputum culture  Acute encephalopathy  CT scan of head without acute findings - ammonia level is within normal range - urinalysis is without suggestion of urinary  tract infection - Depakote level was markedly elevated at presentation - perhaps this was primarily Depakote induced lethargy plus or minus uremia - B12 and folic acid levels pending   Anoxic brain damage s/p PEA arrest  January 2013 secondary to cardio-respiratory failure from pulmonary edema secondary to acute renal failure   Thrombocytopenia Suspected to be Depakote induced compounded by acute blood loss- will need to cont to follow for improvement which could take weeks   HTN  currently receiving Coreg - as mentioned above, added ACE I- dose increased to 20 mg today- PRN Hydralazine is also ordered.    Code Status: full code Family Communication: discussed with patient's mother Disposition Plan: follow in SDU DVT prophylaxis: SCDs   Brief narrative: The patient is a 21 yo woman, history of CHF (EF 30-35%), CKD (MPGN), presenting 9/5 with encephalopathy, found to have severe ascites, s/p large-volume paracentesis 9/7, and later developed intra-abdominal bleeding, anemia, and hemorrhagic shock, requiring intubation, volume resuscitation, and CVVHD.   Consultants:  Nephrology  General surgery  Procedures: R Porter Permcath 9/6 L IJ TLC 9/8  ETT 9/7 R femoral Aline 9/7>>9/9 Paracentesis 9/7  HPI/Subjective: Pt alert, speech slow and deliberate- no current complaints. She is noted to be coughing and sounds congested- she is unable to tell me how long she has had this cough. No complaints of dyspnea or chest pain.   Objective: Filed Vitals:   12/04/11 1200 12/04/11 1431 12/04/11 1441 12/04/11 1556  BP: 168/119 165/11 165/111 172/115  Pulse: 90   92  Temp: 99 F (37.2 C)   98.8 F (37.1 C)  TempSrc:  Oral   Oral  Resp: 16   19  Height:      Weight:      SpO2: 96%   98%   No intake or output data in the 24 hours ending 12/04/11 1652  Exam:   General:  Alert, appears fatigued, no distress  Cardiovascular: RRR, no murmurs  Respiratory: CTA b/l   Abdomen: soft,  moderate distension, BS+, nontender  Ext: no c/c/e  Data Reviewed: Basic Metabolic Panel:  Lab 12/04/11 4098 12/03/11 0525 12/02/11 0500 12/01/11 0500 11/30/11 1538 11/30/11 0510 11/29/11 0224  NA 141 138 137 136 137 -- --  K 3.8 3.9 3.9 3.6 4.1 -- --  CL 106 104 103 103 102 -- --  CO2 28 27 28 29 30  -- --  GLUCOSE 79 81 105* 92 91 -- --  BUN 14 16 8 13 17  -- --  CREATININE 2.10* 2.01* 1.18* 1.28* 1.51* -- --  CALCIUM 8.5 8.2* 8.1* 7.5* 7.9* -- --  MG -- -- -- 2.0 -- 1.9 1.6  PHOS 1.9* 2.0* 1.5* 2.8 3.5 -- --   Liver Function Tests:  Lab 12/04/11 0420 12/03/11 0525 12/02/11 0500 12/01/11 2330 12/01/11 0500 11/30/11 1538 11/29/11 0224 11/28/11 2230  AST -- -- -- -- -- -- 56* 13  ALT -- -- -- 6 -- -- 15 <5  ALKPHOS -- -- -- -- -- -- 62 17*  BILITOT -- -- -- -- -- -- 0.4 0.1*  PROT -- -- -- -- -- -- 6.1 2.7*  ALBUMIN 1.6* 1.4* 1.5* -- 1.4* 1.6* -- --   No results found for this basename: LIPASE:5,AMYLASE:5 in the last 168 hours No results found for this basename: AMMONIA:5 in the last 168 hours CBC:  Lab 12/04/11 0420 12/03/11 0525 12/02/11 0500 12/01/11 0500 11/30/11 0510 11/28/11 2230  WBC 12.4* 11.9* 12.1* 12.2* 14.2* --  NEUTROABS -- -- -- -- -- 3.5  HGB 11.7* 13.2 12.1 10.8* 13.6 --  HCT 36.0 39.6 36.2 32.5* 39.1 --  MCV 90.0 87.8 87.4 88.8 84.8 --  PLT 118* 76* 42* 32* 41* --   Cardiac Enzymes:  Lab 11/29/11 1200 11/29/11 0251 11/28/11 2051  CKTOTAL -- -- 54  CKMB -- -- 1.9  CKMBINDEX -- -- --  TROPONINI <0.30 <0.30 <0.30   BNP (last 3 results)  Basename 03/26/11 2140  PROBNP 11557.0*   CBG:  Lab 12/04/11 1555 12/04/11 1222 12/04/11 0806 12/03/11 2053 12/03/11 1633  GLUCAP 82 75 83 85 88       Studies: reviewed  Scheduled Meds:    . carvedilol  25 mg Oral BID WC  . cefUROXime (ZINACEF)  IV  1.5 g Intravenous On Call to OR  . darbepoetin  60 mcg Subcutaneous Q Wed-1800  . feeding supplement (NEPRO CARB STEADY)  237 mL Oral BID BM  .  levETIRAcetam  250 mg Oral BID  . lisinopril  20 mg Oral Daily  . DISCONTD: lisinopril  10 mg Oral Daily   Continuous Infusions:    . sodium chloride Stopped (11/29/11 1317)    ________________________________________________________________________  Time spent: 30 min    [facility]  [facility] Pager [phone] If 8PM-8AM, please contact night-coverage at [email or web address], password [facility] 12/04/2011, 4:52 PM  LOS: 8 days

## 2011-12-05 ENCOUNTER — Inpatient Hospital Stay (HOSPITAL_COMMUNITY): Payer: Medicaid Other

## 2011-12-05 LAB — CULTURE, BLOOD (ROUTINE X 2): Culture: NO GROWTH

## 2011-12-05 LAB — BASIC METABOLIC PANEL
CO2: 28 mEq/L (ref 19–32)
Calcium: 8.5 mg/dL (ref 8.4–10.5)
GFR calc Af Amer: 33 mL/min — ABNORMAL LOW (ref >90)
GFR calc non Af Amer: 29 mL/min — ABNORMAL LOW (ref >90)
Sodium: 139 mEq/L (ref 135–145)

## 2011-12-05 LAB — CBC
HCT: 39.6 % (ref 36.0–46.0)
Hemoglobin: 12.9 g/dL (ref 12.0–15.0)
MCH: 29.5 pg (ref 26.0–34.0)
MCHC: 32.6 g/dL (ref 30.0–36.0)

## 2011-12-05 LAB — GLUCOSE, CAPILLARY: Glucose-Capillary: 84 mg/dL (ref 70–99)

## 2011-12-05 LAB — RENAL FUNCTION PANEL
BUN: 24 mg/dL — ABNORMAL HIGH (ref 6–23)
Glucose, Bld: 80 mg/dL (ref 70–99)
Phosphorus: 2.1 mg/dL — ABNORMAL LOW (ref 2.3–4.6)
Potassium: 3.8 mEq/L (ref 3.5–5.1)

## 2011-12-05 NOTE — Progress Notes (Signed)
[facility] ROUNDING NOTE   Subjective:   Interval History: 9/8 Paracentesis removed 10 L, hemodynamic instability and bleeding, intubated with bear hugger 9/9 CVVH started, pressors off, BP stable 9/10 Extubated, CVVH stopped, intermittent dialysis started  The patient is sleeping this morning and had tunneled HD cath yesterday. She is not having any pain and is not having any problems breathing. She did have therapy yesterday and recommending CIR. She will have HD today. She was evaluated for CIR and may qualify.   Objective:  Vital signs in last 24 hours:  Temp:  [98.4 F (36.9 C)-99 F (37.2 C)] 98.7 F (37.1 C) (09/14 0456) Pulse Rate:  [86-106] 95  (09/14 0456) Resp:  [15-28] 17  (09/14 0456) BP: (141-175)/(11-119) 174/109 mmHg (09/14 0618) SpO2:  [93 %-98 %] 96 % (09/14 0456)  Weight change:  Filed Weights   12/03/11 0800 12/03/11 1205 12/04/11 0000  Weight: 143 lb 1.3 oz (64.9 kg) 133 lb 9.6 oz (60.6 kg) 134 lb 4.2 oz (60.9 kg)   PE: General: resting in bed, no acute distress HEENT: PERRL, EOMI, no scleral icterus Cardiac: RRR, no rubs, murmurs or gallops Pulm: moving normal volumes of air, no wheezes or rales appreciated Abd: soft, nontender, mildly distended, BS present Ext: warm and well perfused, no pedal edema Neuro: alert and oriented X2 (not to date), cranial nerves II-XII grossly intact  Basic Metabolic Panel:  Lab 12/05/11 4098 12/04/11 0420 12/03/11 0525 12/02/11 0500 12/01/11 0500 11/30/11 0510 11/29/11 0224  NA 137 141 138 137 136 -- --  K 3.8 3.8 3.9 3.9 3.6 -- --  CL 103 106 104 103 103 -- --  CO2 27 28 27 28 29  -- --  GLUCOSE 80 79 81 105* 92 -- --  BUN 24* 14 16 8 13  -- --  CREATININE 2.29* 2.10* 2.01* 1.18* 1.28* -- --  CALCIUM 8.7 8.5 8.2* -- -- -- --  MG -- -- -- -- 2.0 1.9 1.6  PHOS 2.1* 1.9* 2.0* 1.5* 2.8 -- --    Liver Function Tests:  Lab 12/05/11 0630 12/04/11 0420 12/03/11 0525 12/02/11 0500 12/01/11 2330 12/01/11  0500 11/29/11 0224 11/28/11 2230  AST -- -- -- -- -- -- 56* 13  ALT -- -- -- -- 6 -- 15 <5  ALKPHOS -- -- -- -- -- -- 62 17*  BILITOT -- -- -- -- -- -- 0.4 0.1*  PROT -- -- -- -- -- -- 6.1 2.7*  ALBUMIN 1.7* 1.6* 1.4* 1.5* -- 1.4* -- --   CBC:  Lab 12/05/11 0630 12/04/11 0420 12/03/11 0525 12/02/11 0500 12/01/11 0500 11/28/11 2230  WBC 11.1* 12.4* 11.9* 12.1* 12.2* --  NEUTROABS -- -- -- -- -- 3.5  HGB 12.9 11.7* 13.2 12.1 10.8* --  HCT 39.6 36.0 39.6 36.2 32.5* --  MCV 90.4 90.0 87.8 87.4 88.8 --  PLT 178 118* 76* 42* 32* --    Cardiac Enzymes:  Lab 11/29/11 1200 11/29/11 0251 11/28/11 2051  CKTOTAL -- -- 54  CKMB -- -- 1.9  CKMBINDEX -- -- --  TROPONINI <0.30 <0.30 <0.30   CBG:  Lab 12/04/11 2144 12/04/11 1555 12/04/11 1222 12/04/11 0806 12/03/11 2053  GLUCAP 84 82 75 83 85  Imaging: Dg Chest 2 View  12/03/2011  *RADIOLOGY REPORT*  Clinical Data: Cough  CHEST - 2 VIEW  Comparison: Yesterday  Findings: Left internal jugular vein vascular catheter has been removed.  Right internal jugular dialysis catheters stable. Cardiomegaly.  Bibasilar atelectasis and low  lung volumes.  Small left pleural effusion.  Normal vascularity.  Bibasilar atelectasis has improved compared with yesterday.  IMPRESSION: Improved bibasilar atelectasis.  Small left pleural effusion.   Original Report Authenticated By: Donavan Burnet, M.D.      Medications:      . sodium chloride Stopped (11/29/11 1317)      . carvedilol  12.5 mg Oral BID WC  . cefUROXime (ZINACEF)  IV  1.5 g Intravenous On Call to OR  . darbepoetin  60 mcg Subcutaneous Q Wed-1800  . feeding supplement (NEPRO CARB STEADY)  237 mL Oral BID BM  . levETIRAcetam  250 mg Oral BID  . lisinopril  20 mg Oral Daily  . DISCONTD: carvedilol  25 mg Oral BID WC  . DISCONTD: lisinopril  10 mg Oral Daily   sodium chloride, sodium chloride, acetaminophen, camphor-menthol, diphenhydrAMINE, heparin, hydrALAZINE, hydrOXYzine, lidocaine,  lidocaine-prilocaine, ondansetron (ZOFRAN) IV, ondansetron, pentafluoroprop-tetrafluoroeth, sodium chloride  Assessment/ Plan:   1.Acute oliguric renal failure on background of Chronic renal failure - Switched to intermittent dialysis and got dialysis yesterday. Electrolytes are fine today, Cr has stabilized at 2 and urine output was not charted yesterday but had been declining. Likely will be HD bound for life. Vascular did evaluate yesterday and would like to wait until next week when she is stronger to start AVG as her veins are very small (vein study in July) and could not have AVF.  -HD today -Tunneled HD cath placed, AVG next week  2. HTN - BP in 170s/90s. Will follow but likely needs better control. ACE-I was added by primary team and dose increased. Likely needs HD today.  3. Anoxic Brain Injury following PEA arrest - Stable, chronic.   4. Cardiomyopathy. Repeat 2D Echo ordered with no change from baseline.  5. MPGN with C3 nephropathy - Aranesp continue.   6. Seizure disorder - Keppra PO.   [facility] primary and will follow closely. Will likely need  HD 4 days per week for fluid control.   LOS: 9 Genella Mech, MD 12/05/2011,8:23 AM

## 2011-12-05 NOTE — Progress Notes (Signed)
I have seen and examined this patient and agree with plan as outlined by Dr. Dorise Hiss.  [name] is now ESRD and will have HD today.  Cont with UF and await AVG placement on 12/07/11. [name] A,MD 12/05/2011 9:52 AM

## 2011-12-06 LAB — RENAL FUNCTION PANEL
Albumin: 1.7 g/dL — ABNORMAL LOW (ref 3.5–5.2)
CO2: 28 mEq/L (ref 19–32)
Chloride: 102 mEq/L (ref 96–112)
GFR calc Af Amer: 39 mL/min — ABNORMAL LOW (ref >90)
GFR calc non Af Amer: 34 mL/min — ABNORMAL LOW (ref >90)
Potassium: 3.9 mEq/L (ref 3.5–5.1)
Sodium: 136 mEq/L (ref 135–145)

## 2011-12-06 LAB — CBC
Platelets: 186 10*3/uL (ref 150–400)
RBC: 4.16 MIL/uL (ref 3.87–5.11)
WBC: 10.1 10*3/uL (ref 4.0–10.5)

## 2011-12-06 MED ORDER — AMLODIPINE BESYLATE 10 MG PO TABS
10.0000 mg | ORAL_TABLET | Freq: Every day | ORAL | Status: DC
  Administered 2011-12-06 – 2011-12-09 (×4): 10 mg via ORAL
  Filled 2011-12-06 (×4): qty 1

## 2011-12-06 MED ORDER — LISINOPRIL 40 MG PO TABS
40.0000 mg | ORAL_TABLET | Freq: Every day | ORAL | Status: DC
  Administered 2011-12-06 – 2011-12-08 (×3): 40 mg via ORAL
  Filled 2011-12-06 (×3): qty 1

## 2011-12-06 MED ORDER — MEGESTROL ACETATE 40 MG/ML PO SUSP
400.0000 mg | Freq: Two times a day (BID) | ORAL | Status: DC
  Administered 2011-12-06 – 2011-12-09 (×7): 400 mg via ORAL
  Filled 2011-12-06 (×8): qty 10

## 2011-12-06 NOTE — Progress Notes (Signed)
  [name] Progress Note    Subjective:   Interval History:  9/8 Paracentesis removed 10 L, hemodynamic instability and bleeding, intubated with bear hugger  9/9 CVVH started, pressors off, BP stable  9/10 Extubated, CVVH stopped, intermittent dialysis started  HD yesterday. No acute events overnight.   Objective:   BP 143/90  Pulse 103  Temp 99.3 F (37.4 C) (Oral)  Resp 15  Ht 5\' 2"  (1.575 m)  Wt 118 lb 6.2 oz (53.7 kg)  BMI 21.65 kg/m2  SpO2 95%  LMP 11/30/2011  Intake/Output Summary (Last 24 hours) at 12/06/11 0732 Last data filed at 12/05/11 1800  Gross per 24 hour  Intake      0 ml  Output   5001 ml  Net  -5001 ml   Weight change:   Physical Exam: ZOX:WRUEAVW in bed, no acute distress UJW:JXBJYN tachycardic, no r/m/g Resp:CTA b/l without wheezing/rales/rhonchi Abd: soft, nontender but mildly distended, normoactive BS Ext: warm, well perfused, no pedal edema Neuro: AAO x person & place  Imaging: No results found.  Labs: BMET  Lab 12/05/11 1300 12/05/11 0630 12/04/11 0420 12/03/11 0525 12/02/11 0500 12/01/11 0500 11/30/11 1538 11/30/11 0510  NA 139 137 141 138 137 136 137 --  K 3.9 3.8 3.8 3.9 3.9 3.6 4.1 --  CL 105 103 106 104 103 103 102 --  CO2 28 27 28 27 28 29 30  --  GLUCOSE 75 80 79 81 105* 92 91 --  BUN 27* 24* 14 16 8 13 17  --  CREATININE 2.32* 2.29* 2.10* 2.01* 1.18* 1.28* 1.51* --  ALB -- -- -- -- -- -- -- --  CALCIUM 8.5 8.7 8.5 8.2* 8.1* 7.5* 7.9* --  PHOS -- 2.1* 1.9* 2.0* 1.5* 2.8 3.5 4.1   CBC  Lab 12/05/11 0630 12/04/11 0420 12/03/11 0525 12/02/11 0500  WBC 11.1* 12.4* 11.9* 12.1*  NEUTROABS -- -- -- --  HGB 12.9 11.7* 13.2 12.1  HCT 39.6 36.0 39.6 36.2  MCV 90.4 90.0 87.8 87.4  PLT 178 118* 76* 42*    Medications:      . carvedilol  12.5 mg Oral BID WC  . darbepoetin  60 mcg Subcutaneous Q Wed-1800  . feeding supplement (NEPRO CARB STEADY)  237 mL Oral BID BM  . levETIRAcetam  250 mg Oral BID  .  lisinopril  20 mg Oral Daily     Assessment/ Plan:    # Acute oliguric renal failure on background of Chronic renal failure - Intermittent dialysis - last HD 12/05/11. AM labs pending, no recorded UO. Likely will be HD bound for life, with HD 4 times/week for fluid mgmt.  Tunneled HD cath placed on 12/04/11.   Vein study in July & plan for AVG placement this week.  -HD tomorrow   # HTN - BP moderately elevated (ABP 150-160s). Mgmt with carvedilol, lisinopril & HD  # Anoxic Brain Injury following PEA arrest - Stable, chronic.   # Cardiomyopathy. Repeat 2D Echo ordered with no change from baseline.   # MPGN with C3 nephropathy - Aranesp continue.   # Seizure disorder - Keppra PO.    Stacy Gardner, MD 12/06/2011, 7:32 AM

## 2011-12-06 NOTE — Progress Notes (Signed)
SLP Cancellation Note  Attempt x1 for ST treatment and not completed as patient sleep.  Patient's mother present in room requested for ST to attempt at later time. ST to follow in acute care setting for diet tolerance. Moreen Fowler MS, CCC-SLP [phone] [facility] 12/06/2011, 11:44 AM

## 2011-12-06 NOTE — Progress Notes (Signed)
I have seen and examined this patient and agree with plan as outlined by Dr. Everardo Beals. Will plan HD tomorrow and agree with increase in lisinopril and addition of amlodipine 5mg  daily.  Awaiting access placement per [facility]. Gwin Eagon A,MD 12/06/2011 10:06 AM

## 2011-12-06 NOTE — Progress Notes (Signed)
Plan for Access this week.  Date unknown currently  Durene Cal,

## 2011-12-06 NOTE — Progress Notes (Addendum)
[facility] PROGRESS NOTE  Heather Sims ZOX:[id number] DOB: 03-20-1991 DOA: 11/26/2011 PCP: Jaclyn Shaggy, MD  Assessment/Plan: Active Problems:   Anemia presumed to be due to acute intra-abdominal blood loss as a complication of paracentesis S/p transfusion of 8 U prbc, Hgb was noted to jump from 1.3 to 14.6 in a matter of 4 hrs- I am highly suspicious of the accuracy of the Hgb of 1.3. Prior to this her Hgb was 9.0 - Hgb has since been stable.   Hypotension/ shock Likely due to fluid shifts from large volume paracentesis in addition to hemorrhagic shock- resolved with volume resuscitation and now HTN is more of a problem  Acute Oliguric Renal failure on chronic renal failure- MPGN Type 2 dialysis per nephrology via tunneled dialysis catheter - Per Dr Edilia Bo, she will need a graft in left arm rather than a fistula - this is planned for later this week.   Fever 100.7 on evening of 9/12- follow for further fevers-may be aspirating- WBC has improved to 10.   Diarrhea with multiple episodes of C. Diff in the past 4 months Likely due to ongoing intake of liquids and not solids-Nephrocarb may be resulting in some of this therefore, I have held it for now- will obtain a fecal lactoferrin- doubt a recurrence of C.diff colitis as WBC count has been stable and per RN, stool does not have the appearance of C. Diff infection- If stool study is negative for infectious causes, will give Lomotil - for now, due to the frequency of watery stool, will need to insert a rectal tube.   Bradycardia Short lived episodes on 9/13- decreased Coreg - no further episodes of bradycardia noted   Ascites Re-accumulation after large volume paracentesis- no signs of cirrhosis and therefore likely due to progression of renal failure and hypoalbuminemia  Seizure disorder with Depakote toxicity  Depakote has been discontinued due to toxicity and thrombocytopenia- the patient is currently stable on Keppra    Cardiomyopathy  Ejection fraction 40-45% via echocardiogram January 2013 - repeat ECHO reveals progression of LV and RV systolic function-  EF of 30-35% and moderate concentric hypertrophy of LV, mild to moderately reduced RV function- B blocker ordered- due to bradycardic episode,  Coreg has been decreased for now- cont to follow.  Added ACE I on 9/13 and dose increased yesterday- will increase to 40 mg today-   Congested cough/ infiltrate in LLL?  PA/Lat CXR  performed on 9/12 reveals improving infiltrate/atelectasis- Patient no longer complaining of cough but still high risk of aspiration  Dysphagia  Has been closely followed by SLP since her anoxic brain injury in Jan, 2013- currently on mechanical soft with thin liquids- Per patient's mother, she aspirated once (that she knows of) due to being fed when lethargic and this occurred just prior to this hospital admission  Acute encephalopathy  CT scan of head without acute findings - ammonia level is within normal range - urinalysis is without suggestion of urinary tract infection - Depakote level was markedly elevated at presentation - perhaps this was primarily Depakote induced lethargy plus  uremia -    Anoxic brain damage s/p PEA arrest  March 26, 2011 secondary to cardio-respiratory failure from pulmonary edema as a result of acute renal failure   Thrombocytopenia Suspected to be Depakote induced compounded by acute blood loss- her counts have now recovered.   HTN  currently receiving Coreg - as mentioned above dose was need to be decreased due to episodes of bradycardia, added  ACE I- increasing dose of Lisinopril to 40 mg today - She continue to have a significantly elevated diastolic component as well- will add Norvasc today.     Protein-Calorie malnutrition- severe Patient unable to tolerate solids due to lack of appetite- I suspect depression is playing a large part in this as well.    Code Status: full code Family  Communication: discussed with patient's mother Disposition Plan: plans for CIR to be further discussed with Case managers/ Social workers in AM DVT prophylaxis: SCDs   Brief narrative: The patient is a 21 yo woman, history of CHF (EF 30-35%), CKD (MPGN), presenting 9/5 with encephalopathy, found to have severe ascites and Depakote toxicity- she is s/p large-volume paracentesis 9/7- subsequently developed intra-abdominal bleeding and hemorrhagic shock, requiring intubation, volume resuscitation, and CVVHD. Pt has an extensive history- she developed cardio-pulmonary arrest on Jan 3 of 2013 and has since been declining- she has had a long stay at [facility] where she developed C.diff colitis in June and and then had a recurrence later this summer. She was diagnosed with MPGN and has been followed closely by [facility]. Her renal function has declined to a point where she now needs permanent dialysis. As noted above, she presented with significant fluid overload this admission.    Consultants:  Nephrology  General surgery  Procedures: R [place] Permcath 9/6 L IJ TLC 9/8  ETT 9/7 R femoral Aline 9/7>>9/9 Paracentesis 9/7  HPI/Subjective: Pt is sleepy this AM- she has had a rough night due to ongoing diarrhea - per RN, she has had about 6 watery stools which are orange in color- Patient's mother is concerned about her continued inability to take in solid food and is requesting an appetite stimulant- per patient's mother, she was taking a "liquid" stimulant back in January of this year which apparently was effective in increasing her appetite- her Mother is requesting that this be initiated.   Objective: Filed Vitals:   12/06/11 0415 12/06/11 0500 12/06/11 0600 12/06/11 0833  BP: 157/105 161/109 143/90 165/112  Pulse: 86 91 103 72  Temp: 99.3 F (37.4 C)   98.2 F (36.8 C)  TempSrc: Oral   Axillary  Resp: 20 19 15 21   Height:      Weight:      SpO2: 95% 92% 95% 96%    Intake/Output Summary  (Last 24 hours) at 12/06/11 0907 Last data filed at 12/06/11 0800  Gross per 24 hour  Intake      0 ml  Output   5002 ml  Net  -5002 ml    Exam:   General:  Alert, appears fatigued, no distress  Cardiovascular: RRR, no murmurs  Respiratory: CTA b/l   Abdomen: soft, moderate distension, BS+, nontender  Ext: no c/c/e  Data Reviewed: Basic Metabolic Panel:  Lab 12/06/11 1914 12/05/11 1300 12/05/11 0630 12/04/11 0420 12/03/11 0525 12/02/11 0500 12/01/11 0500 11/30/11 0510  NA 136 139 137 141 138 -- -- --  K 3.9 3.9 3.8 3.8 3.9 -- -- --  CL 102 105 103 106 104 -- -- --  CO2 28 28 27 28 27  -- -- --  GLUCOSE 83 75 80 79 81 -- -- --  BUN 16 27* 24* 14 16 -- -- --  CREATININE 2.02* 2.32* 2.29* 2.10* 2.01* -- -- --  CALCIUM 8.6 8.5 8.7 8.5 8.2* -- -- --  MG -- -- -- -- -- -- 2.0 1.9  PHOS 2.0* -- 2.1* 1.9* 2.0* 1.5* -- --  Liver Function Tests:  Lab 12/06/11 0801 12/05/11 0630 12/04/11 0420 12/03/11 0525 12/02/11 0500 12/01/11 2330  AST -- -- -- -- -- --  ALT -- -- -- -- -- 6  ALKPHOS -- -- -- -- -- --  BILITOT -- -- -- -- -- --  PROT -- -- -- -- -- --  ALBUMIN 1.7* 1.7* 1.6* 1.4* 1.5* --   No results found for this basename: LIPASE:5,AMYLASE:5 in the last 168 hours No results found for this basename: AMMONIA:5 in the last 168 hours CBC:  Lab 12/06/11 0801 12/05/11 0630 12/04/11 0420 12/03/11 0525 12/02/11 0500  WBC 10.1 11.1* 12.4* 11.9* 12.1*  NEUTROABS -- -- -- -- --  HGB 12.4 12.9 11.7* 13.2 12.1  HCT 37.6 39.6 36.0 39.6 36.2  MCV 90.4 90.4 90.0 87.8 87.4  PLT 186 178 118* 76* 42*   Cardiac Enzymes:  Lab 11/29/11 1200  CKTOTAL --  CKMB --  CKMBINDEX --  TROPONINI <0.30   BNP (last 3 results)  Basename 03/26/11 2140  PROBNP 11557.0*   CBG:  Lab 12/05/11 0840 12/04/11 2144 12/04/11 1555 12/04/11 1222 12/04/11 0806  GLUCAP 84 84 82 75 83       Studies: reviewed  Scheduled Meds:    . carvedilol  12.5 mg Oral BID WC  . darbepoetin  60 mcg  Subcutaneous Q Wed-1800  . levETIRAcetam  250 mg Oral BID  . lisinopril  20 mg Oral Daily  . megestrol  400 mg Oral BID  . DISCONTD: feeding supplement (NEPRO CARB STEADY)  237 mL Oral BID BM   Continuous Infusions:    . sodium chloride Stopped (11/29/11 1317)    ________________________________________________________________________  Time spent: 30 min    [facility]  [facility] Pager [phone] If 8PM-8AM, please contact night-coverage at [email or web address], password [facility] 12/06/2011, 9:07 AM  LOS: 10 days

## 2011-12-06 NOTE — Progress Notes (Signed)
[facility] PROGRESS NOTE  SPECIAL RANES NWG:[id number] DOB: 09/28/1990 DOA: 11/26/2011 PCP: Jaclyn Shaggy, MD  Assessment/Plan: Active Problems:  Anemia presumed to be due to acute intra-abdominal blood loss as a complication of paracentesis S/p transfusion of 8 U prbc, Hgb was noted to jump from 1.3 to 14.6 in a matter of 4 hrs- I am highly suspicious of the accuracy of the Hgb of 1.3. Prior to this her Hgb was 9.0 - Hgb has since been stable.   Hypotension/ shock Likely due to fluid shifts from large volume paracentesis- resolved with volume resusitation  Acute Oliguric Renal failure on chronic renal failure- MPGN Type 2 dialysis per nephrology via dialysis catheter - Per Dr Edilia Bo, she will need a graft in left arm rather than a fistula   Fever 100.7 on evening of 9/12- follow for further fevers- WBC is holding at 11-12 range.   Diarrhea Likely due to ongoing intake of liquids and not solids- will follow  Bradycardia Short lived episode on 9/13- decreased Coreg - no further episodes of bradycardia noted today  Ascites Re-accumulation after large volume paracentesis- no signs of cirrhosis and therefore likely due to progression of renal failure and hypoalbuminemia  Seizure disorder with Depakote toxicity  Depakote has been discontinued and the patient is receiving Keppra   Cardiomyopathy  Ejection fraction 40-45% via echocardiogram January 2013 - repeat ECHO reveals progression of LV and RV systolic function-  EF of 30-35% and moderate concentric hypertrophy of LV, mild to moderately reduced RV function- B blocker ordered- due to bradycardic episode,  Coreg has been decreased for now- cont to follow.  Added ACE I  Congested cough/ infiltrate in LLL?  PA/Lat CXR  performed on 9/12 reveals improving infiltrate/atelectasis.   Acute encephalopathy  CT scan of head without acute findings - ammonia level is within normal range - urinalysis is without suggestion of urinary  tract infection - Depakote level was markedly elevated at presentation - perhaps this was primarily Depakote induced lethargy plus or minus uremia - B12 and folic acid levels pending   Anoxic brain damage s/p PEA arrest  January 2013 secondary to cardio-respiratory failure from pulmonary edema secondary to acute renal failure   Thrombocytopenia Suspected to be Depakote induced compounded by acute blood loss- will need to cont to follow for improvement which could take weeks   HTN  currently receiving Coreg - as mentioned above, added ACE I- PRN Hydralazine is also ordered.    Code Status: full code Family Communication: discussed with patient's mother Disposition Plan: follow in SDU DVT prophylaxis: SCDs   Brief narrative: The patient is a 21 yo woman, history of CHF (EF 30-35%), CKD (MPGN), presenting 9/5 with encephalopathy, found to have severe ascites, s/p large-volume paracentesis 9/7, and later developed intra-abdominal bleeding, anemia, and hemorrhagic shock, requiring intubation, volume resuscitation, and CVVHD.   Consultants:  Nephrology  General surgery  Procedures: R [place] Permcath 9/6 L IJ TLC 9/8  ETT 9/7 R femoral Aline 9/7>>9/9 Paracentesis 9/7  HPI/Subjective: Pt alert, has had another loose stool this AM. She states that she continues to have a poor appetite and has not taken much solid food.  Objective: Filed Vitals:   12/05/11 2100 12/05/11 2200 12/05/11 2300 12/06/11 0000  BP: 129/97 150/106  159/104  Pulse: 85 96 90 86  Temp:      TempSrc:    Oral  Resp: 16 14  17   Height:      Weight:  SpO2: 97% 97% 96% 97%    Intake/Output Summary (Last 24 hours) at 12/06/11 0029 Last data filed at 12/05/11 1800  Gross per 24 hour  Intake      0 ml  Output   5002 ml  Net  -5002 ml    Exam:   General:  Alert, appears fatigued, no distress  Cardiovascular: RRR, no murmurs  Respiratory: CTA b/l   Abdomen: soft, moderate distension, BS+,  nontender  Ext: no c/c/e  Data Reviewed: Basic Metabolic Panel:  Lab 12/05/11 1610 12/05/11 0630 12/04/11 0420 12/03/11 0525 12/02/11 0500 12/01/11 0500 11/30/11 0510 11/29/11 0224  NA 139 137 141 138 137 -- -- --  K 3.9 3.8 3.8 3.9 3.9 -- -- --  CL 105 103 106 104 103 -- -- --  CO2 28 27 28 27 28  -- -- --  GLUCOSE 75 80 79 81 105* -- -- --  BUN 27* 24* 14 16 8  -- -- --  CREATININE 2.32* 2.29* 2.10* 2.01* 1.18* -- -- --  CALCIUM 8.5 8.7 8.5 8.2* 8.1* -- -- --  MG -- -- -- -- -- 2.0 1.9 1.6  PHOS -- 2.1* 1.9* 2.0* 1.5* 2.8 -- --   Liver Function Tests:  Lab 12/05/11 0630 12/04/11 0420 12/03/11 0525 12/02/11 0500 12/01/11 2330 12/01/11 0500 11/29/11 0224  AST -- -- -- -- -- -- 56*  ALT -- -- -- -- 6 -- 15  ALKPHOS -- -- -- -- -- -- 62  BILITOT -- -- -- -- -- -- 0.4  PROT -- -- -- -- -- -- 6.1  ALBUMIN 1.7* 1.6* 1.4* 1.5* -- 1.4* --   No results found for this basename: LIPASE:5,AMYLASE:5 in the last 168 hours No results found for this basename: AMMONIA:5 in the last 168 hours CBC:  Lab 12/05/11 0630 12/04/11 0420 12/03/11 0525 12/02/11 0500 12/01/11 0500  WBC 11.1* 12.4* 11.9* 12.1* 12.2*  NEUTROABS -- -- -- -- --  HGB 12.9 11.7* 13.2 12.1 10.8*  HCT 39.6 36.0 39.6 36.2 32.5*  MCV 90.4 90.0 87.8 87.4 88.8  PLT 178 118* 76* 42* 32*   Cardiac Enzymes:  Lab 11/29/11 1200 11/29/11 0251  CKTOTAL -- --  CKMB -- --  CKMBINDEX -- --  TROPONINI <0.30 <0.30   BNP (last 3 results)  Basename 03/26/11 2140  PROBNP 11557.0*   CBG:  Lab 12/05/11 0840 12/04/11 2144 12/04/11 1555 12/04/11 1222 12/04/11 0806  GLUCAP 84 84 82 75 83       Studies: reviewed  Scheduled Meds:    . carvedilol  12.5 mg Oral BID WC  . cefUROXime (ZINACEF)  IV  1.5 g Intravenous On Call to OR  . darbepoetin  60 mcg Subcutaneous Q Wed-1800  . feeding supplement (NEPRO CARB STEADY)  237 mL Oral BID BM  . levETIRAcetam  250 mg Oral BID  . lisinopril  20 mg Oral Daily   Continuous  Infusions:    . sodium chloride Stopped (11/29/11 1317)    ________________________________________________________________________  Time spent: 30 min    [facility]  [facility] Pager [phone] If 8PM-8AM, please contact night-coverage at [email or web address], password [facility] 12/06/2011, 12:29 AM  LOS: 10 days

## 2011-12-07 ENCOUNTER — Inpatient Hospital Stay (HOSPITAL_COMMUNITY): Payer: Medicaid Other

## 2011-12-07 DIAGNOSIS — D62 Acute posthemorrhagic anemia: Secondary | ICD-10-CM

## 2011-12-07 DIAGNOSIS — I509 Heart failure, unspecified: Secondary | ICD-10-CM

## 2011-12-07 LAB — CBC
HCT: 37.2 % (ref 36.0–46.0)
Hemoglobin: 12.3 g/dL (ref 12.0–15.0)
MCV: 90.5 fL (ref 78.0–100.0)
RDW: 15.2 % (ref 11.5–15.5)
WBC: 11.3 10*3/uL — ABNORMAL HIGH (ref 4.0–10.5)

## 2011-12-07 LAB — RENAL FUNCTION PANEL
Albumin: 1.7 g/dL — ABNORMAL LOW (ref 3.5–5.2)
BUN: 24 mg/dL — ABNORMAL HIGH (ref 6–23)
Chloride: 100 mEq/L (ref 96–112)
Creatinine, Ser: 2.43 mg/dL — ABNORMAL HIGH (ref 0.50–1.10)
Glucose, Bld: 84 mg/dL (ref 70–99)

## 2011-12-07 MED ORDER — HYDRALAZINE HCL 25 MG PO TABS
25.0000 mg | ORAL_TABLET | Freq: Three times a day (TID) | ORAL | Status: DC
  Administered 2011-12-07 – 2011-12-08 (×3): 25 mg via ORAL
  Filled 2011-12-07 (×7): qty 1

## 2011-12-07 NOTE — Progress Notes (Signed)
[facility] PROGRESS NOTE  Heather Sims UJW:[id number] DOB: Dec 05, 1990 DOA: 11/26/2011 PCP: Jaclyn Shaggy, MD  Brief narrative: 21 yo woman, history of CHF (EF 30-35%), CKD (MPGN), presenting 9/5 with encephalopathy, found to have severe ascites and Depakote toxicity- s/p large-volume paracentesis 9/7- subsequently developed ?intra-abdominal bleeding and hemorrhagic vs/ hypovolemic shock, requiring intubation, volume resuscitation, and CVVHD. Pt has an extensive history- she developed cardio-pulmonary arrest on Jan 3 of 2013 and has since been declining- she has had a long stay at [facility] where she developed C.diff colitis in June and and then had a recurrence later this summer. She was diagnosed with MPGN and has been followed closely by [facility]. Her renal function has declined to a point where she now needs permanent dialysis. As noted above, she presented with significant fluid overload this admission.   Assessment/Plan:  Anemia presumed to be due to acute intra-abdominal blood loss as a complication of paracentesis S/p transfusion of 8 U prbc, Hgb was noted to jump from 1.3 to 14.6 in a matter of 4 hrs -we are highly suspicious of the accuracy of the Hgb of 1.3. Prior to this her Hgb was 9.0 - Hgb has since been stable.   Hypotension / hypovolemic shock RESOLVED Likely due to fluid shifts from large volume paracentesis in addition to ?hemorrhagic shock- resolved with volume resuscitation and now HTN is more of a problem  Acute Oliguric Renal failure on chronic renal failure- MPGN Type 2 dialysis per nephrology via tunneled dialysis catheter - Per Dr Edilia Bo, she will need a graft in left arm rather than a fistula - this is planned for later this week.   Fever 100.7 on evening of 9/12 - follow for further fevers - may be aspirating - WBC has improved to 10.   Diarrhea with multiple episodes of C. Diff in the past 4 months Likely due to ongoing intake of only liquids and not  solids - Nephrocarb may be resulting in some of this therefore, we have held it for now - fecal lactoferrin is in process- doubt a recurrence of C.diff colitis as WBC count has been stable - If stool study is negative for infectious causes, will give Lomotil - due to the frequency of watery stool patient currently has a rectal tube  Bradycardia Short lived episodes on 9/13 - decreased Coreg - no further episodes of bradycardia noted   Ascites Re-accumulation after large volume paracentesis - no signs of cirrhosis and therefore likely due to progression of renal failure and hypoalbuminemia  Seizure disorder with Depakote toxicity  Depakote has been discontinued due to toxicity and thrombocytopenia - the patient is currently stable on Keppra   Cardiomyopathy  Ejection fraction 40-45% via echocardiogram January 2013 - repeat ECHO reveals progression of LV and RV systolic dysfunction -  EF of 56-21% and moderate concentric hypertrophy of LV, mild to moderately reduced RV function -B blocker ordered - due to bradycardic episode, Coreg has been decreased - Added ACE I on 9/13   Congested cough/ infiltrate in LLL? PA/Lat CXR performed 9/12 revealed improving infiltrate/atelectasis - Patient no longer complaining of cough but still high risk of aspiration  Dysphagia  Has been closely followed by [facility] since her anoxic brain injury in Jan, 2013 - currently on mechanical soft with thin liquids  Acute encephalopathy  CT scan of head without acute findings - ammonia level is within normal range - urinalysis is without suggestion of urinary tract infection - Depakote level was markedly elevated at  presentation - perhaps this was primarily Depakote induced lethargy plus uremia - mental status is much improved today   Anoxic brain damage s/p PEA arrest  March 26, 2011 secondary to cardio-respiratory failure from pulmonary edema as a result of acute renal failure   Thrombocytopenia Suspected to be Depakote  induced compounded by acute blood loss - her counts have now recovered  HTN currently receiving Coreg - as mentioned above dose was decreased due to episodes of bradycardia, added Lisinopril this admission - She continue to have a significantly elevated diastolic component as well - added Norvasc 9/15 - begin hydralazine 9/16.     Protein-Calorie malnutrition- severe Patient unable to tolerate solids due to lack of appetite - I suspect depression is playing a large part in this as well.   Code Status: full code Family Communication: discussed with patient's mother Disposition Plan: plans for CIR to be further discussed with Case managers/ Social workers in AM. Transfer to floor DVT prophylaxis: SCDs  Consultants:  Nephrology  General surgery  Procedures: R Purvis Permcath 9/6 L IJ TLC 9/8  ETT 9/7 R femoral Aline 9/7>>9/9 Paracentesis 9/7  HPI/Subjective: Pt is sleepy this AM- she has had a rough night due to ongoing diarrhea - per RN, she has had about 6 watery stools which are orange in color- Patient's mother is concerned about her continued inability to take in solid food and is requesting an appetite stimulant- per patient's mother, she was taking a "liquid" stimulant back in January of this year which apparently was effective in increasing her appetite- her Mother is requesting that this be initiated.   Objective: Filed Vitals:   12/07/11 0855 12/07/11 0930 12/07/11 1018 12/07/11 1019  BP: 138/100 139/94 160/111 160/111  Pulse: 101     Temp:      TempSrc:      Resp:      Height:      Weight:      SpO2:        Intake/Output Summary (Last 24 hours) at 12/07/11 1039 Last data filed at 12/07/11 0900  Gross per 24 hour  Intake    240 ml  Output      0 ml  Net    240 ml    Exam:   General:  Alert, conversant and quite interactive  Cardiovascular: RRR, no murmurs  Respiratory: CTA b/l w/o wheeze or focal crackles appreciable  Abdomen: soft but protuberent w/  obvious ascites - no focal mass - no rebound  Ext: 1+ B LE edema w/o cyanosis or clubbing   Data Reviewed: Basic Metabolic Panel:  Lab 12/07/11 1610 12/06/11 0801 12/05/11 1300 12/05/11 0630 12/04/11 0420 12/03/11 0525 12/01/11 0500  NA 135 136 139 137 141 -- --  K 3.8 3.9 3.9 3.8 3.8 -- --  CL 100 102 105 103 106 -- --  CO2 27 28 28 27 28  -- --  GLUCOSE 84 83 75 80 79 -- --  BUN 24* 16 27* 24* 14 -- --  CREATININE 2.43* 2.02* 2.32* 2.29* 2.10* -- --  CALCIUM 8.7 8.6 8.5 8.7 8.5 -- --  MG -- -- -- -- -- -- 2.0  PHOS 2.6 2.0* -- 2.1* 1.9* 2.0* --   Liver Function Tests:  Lab 12/07/11 0433 12/06/11 0801 12/05/11 0630 12/04/11 0420 12/03/11 0525 12/01/11 2330  AST -- -- -- -- -- --  ALT -- -- -- -- -- 6  ALKPHOS -- -- -- -- -- --  BILITOT -- -- -- -- -- --  PROT -- -- -- -- -- --  ALBUMIN 1.7* 1.7* 1.7* 1.6* 1.4* --   CBC:  Lab 12/07/11 0433 12/06/11 0801 12/05/11 0630 12/04/11 0420 12/03/11 0525  WBC 11.3* 10.1 11.1* 12.4* 11.9*  NEUTROABS -- -- -- -- --  HGB 12.3 12.4 12.9 11.7* 13.2  HCT 37.2 37.6 39.6 36.0 39.6  MCV 90.5 90.4 90.4 90.0 87.8  PLT 204 186 178 118* 76*    Basename 03/26/11 2140  PROBNP 11557.0*   CBG:  Lab 12/05/11 0840 12/04/11 2144 12/04/11 1555 12/04/11 1222 12/04/11 0806  GLUCAP 84 84 82 75 83    Studies: reviewed ________________________________________________________________________  Time spent: 30 min  ELLIS,ALLISON L.  [facility] Pager [phone] If 8PM-8AM, please contact night-coverage at [email or web address], password [facility] 12/07/2011, 10:39 AM  LOS: 11 days    I have personally examined this patient and reviewed the entire database. I have reviewed the above note, made any necessary editorial changes, and agree with its content.  Lonia Blood, MD [facility]

## 2011-12-07 NOTE — Progress Notes (Signed)
Physical Therapy Treatment Patient Details Name: Heather Sims MRN: [id number] DOB: [birth year] Today's Date: 12/07/2011 Time: 4098-1191 PT Time Calculation (min): 27 min  PT Assessment / Plan / Recommendation Comments on Treatment Session  Patient s/p encephalopathy and multiple medical problems with decr mobility secondary to decr endurance for activity as well as decr safety awareness.  Improved sitting balance today.  Needs to continue PT.      Follow Up Recommendations  Inpatient Rehab    Barriers to Discharge        Equipment Recommendations  Defer to next venue    Recommendations for Other Services    Frequency Min 3X/week   Plan Discharge plan remains appropriate;Frequency remains appropriate    Precautions / Restrictions Precautions Precautions: Fall Restrictions Weight Bearing Restrictions: No   Pertinent Vitals/Pain VSS, No pain    Mobility  Bed Mobility Bed Mobility: Rolling Left;Left Sidelying to Sit;Sitting - Scoot to Edge of Bed Rolling Right: Not tested (comment) Rolling Left: 4: Min guard;With rail Right Sidelying to Sit: Not tested (comment) Left Sidelying to Sit: 4: Min guard;HOB elevated;With rails Sitting - Scoot to Edge of Bed: 4: Min guard;With rail Sit to Supine: Not Tested (comment) Details for Bed Mobility Assistance: Patient able to come to EOB with rail with cues only.  Incr tone in LEs when bringing them off bed.  Transfers Transfers: Sit to Stand;Stand to [employer] Transfers Sit to Stand: With upper extremity assist;2: Max assist;From bed Stand to Sit: 2: Max assist;With upper extremity assist;With armrests;To chair/3-in-1 Stand Pivot Transfers: 2: Max assist Squat Pivot Transfers: Not tested (comment) Details for Transfer Assistance: Patient was lying in bed on arrival and pad under her was wet with urine.  Assisted sit to stand with min-mod assist on first stand attempt and patient stood to be cleaned with mod assist with LEs very  weak needing her knees blocked and patient holding around PT's shoulders.  Second attempt, patient needed max assist secondary to patient slightly dizzy and anxious.  Partial stand pivot transfer into chair after second stand with bil knees blocked and use of gait belt.   Ambulation/Gait Ambulation/Gait Assistance: Not tested (comment) Stairs: No Wheelchair Mobility Wheelchair Mobility: No    PT Goals Acute Rehab PT Goals PT Goal: Supine/Side to Sit - Progress: Progressing toward goal PT Goal: Sit at Edge Of Bed - Progress: Progressing toward goal PT Goal: Sit to Stand - Progress: Progressing toward goal PT Goal: Stand to Sit - Progress: Progressing toward goal PT Goal: Stand - Progress: Progressing toward goal  Visit Information  Last PT Received On: 12/07/11 Assistance Needed: +2    Subjective Data  Subjective: "I want to get up."   Cognition  Overall Cognitive Status: Impaired Area of Impairment: Attention;Memory;Following commands;Safety/judgement;Awareness of deficits Arousal/Alertness: Awake/alert Orientation Level: Appears intact for tasks assessed Behavior During Session: Flat affect Current Attention Level: Sustained Following Commands: Follows one step commands consistently;Follows one step commands with increased time Safety/Judgement: Decreased awareness of need for assistance    Balance  Static Sitting Balance Static Sitting - Balance Support: Feet supported;Bilateral upper extremity supported Static Sitting - Level of Assistance: 5: Stand by assistance Static Sitting - Comment/# of Minutes: Able to sit unsupported for 5 minutes and maintain balance. Dynamic Sitting Balance Dynamic Sitting - Level of Assistance: Not tested (comment)  End of Session PT - End of Session Equipment Utilized During Treatment: Gait belt Activity Tolerance: Patient limited by fatigue Patient left: in chair;with call bell/phone within reach;with family/visitor  present Nurse  Communication: Mobility status;Need for lift equipment       INGOLD,Athira Janowicz 12/07/2011, 11:36 AM  [facility] [phone] (pager)

## 2011-12-07 NOTE — Progress Notes (Signed)
[facility] Progress Note    Subjective:   Interval History:  9/8 Paracentesis removed 10 L, hemodynamic instability and bleeding, intubated with bear hugger  9/9 CVVH started, pressors off, BP stable  9/10 Extubated, CVVH stopped, intermittent dialysis started  HD Saturday. No acute events overnight. Says she feels good and is bored    Objective:   BP 145/103  Pulse 99  Temp 99.1 F (37.3 C) (Oral)  Resp 14  Ht 5\' 2"  (1.575 m)  Wt 53.7 kg (118 lb 6.2 oz)  BMI 21.65 kg/m2  SpO2 96%  LMP 11/30/2011 No intake or output data in the 24 hours ending 12/07/11 0813 Weight change:   Physical Exam: RUE:AVWUJWJ in bed, no acute distress XBJ:YNWGNF tachycardic, no r/m/g Resp:CTA b/l without wheezing/rales/rhonchi Abd: soft, nontender but mildly distended, normoactive BS Ext: warm, well perfused, no pedal edema Neuro: AAO x person & place  Imaging: No results found.  Labs: BMET  Lab 12/07/11 0433 12/06/11 0801 12/05/11 1300 12/05/11 0630 12/04/11 0420 12/03/11 0525 12/02/11 0500 12/01/11 0500  NA 135 136 139 137 141 138 137 --  K 3.8 3.9 3.9 3.8 3.8 3.9 3.9 --  CL 100 102 105 103 106 104 103 --  CO2 27 28 28 27 28 27 28  --  GLUCOSE 84 83 75 80 79 81 105* --  BUN 24* 16 27* 24* 14 16 8  --  CREATININE 2.43* 2.02* 2.32* 2.29* 2.10* 2.01* 1.18* --  ALB -- -- -- -- -- -- -- --  CALCIUM 8.7 8.6 8.5 8.7 8.5 8.2* 8.1* --  PHOS 2.6 2.0* -- 2.1* 1.9* 2.0* 1.5* 2.8   CBC  Lab 12/07/11 0433 12/06/11 0801 12/05/11 0630 12/04/11 0420  WBC 11.3* 10.1 11.1* 12.4*  NEUTROABS -- -- -- --  HGB 12.3 12.4 12.9 11.7*  HCT 37.2 37.6 39.6 36.0  MCV 90.5 90.4 90.4 90.0  PLT 204 186 178 118*    Medications:       . amLODipine  10 mg Oral Daily  . carvedilol  12.5 mg Oral BID WC  . darbepoetin  60 mcg Subcutaneous Q Wed-1800  . levETIRAcetam  250 mg Oral BID  . lisinopril  40 mg Oral Daily  . megestrol  400 mg Oral BID  . DISCONTD: feeding supplement (NEPRO CARB  STEADY)  237 mL Oral BID BM  . DISCONTD: lisinopril  20 mg Oral Daily     Assessment/ Plan:   21 year old BF with multiple medical issues, now new start to HD   # New ESRD -   last HD 12/05/11. AM labs pending, no recorded UO. Originally thought to need HD 4 times/week for fluid mgmt, however looks good today.  Will not commit her to that unless she fails other  Tunneled HD cath placed on 12/04/11.   Vein study in July & plan for AVG placement this week.  -HD today.  Will try MWF this week  # HTN - BP moderately elevated (ABP 150-160s). Mgmt with carvedilol, lisinopril & HD.  Much better than baseline but still over goal  # Anoxic Brain Injury following PEA arrest - Stable, chronic.   # Cardiomyopathy. Repeat 2D Echo ordered with no change from baseline.   # MPGN with C3 nephropathy - Aranesp continue.   # Seizure disorder - Keppra PO.   #Anemia- not an issue, no ESA needed  # Bones- last PTH in double digits,no vitamin D needed, will recheck.  Phos low, no binders needed  #  Dispo- PT and eventually home with mother who takes good care of her   Adlyn Fife A  12/07/2011, 8:13 AM

## 2011-12-07 NOTE — Progress Notes (Signed)
Nutrition Follow-up  Intervention:   1. Recommend nutrition support given prolonged suboptimal PO intake and ongoing malnutrition 2. Recommend renal MVI while on HD 3. Recommend resuming oral nutrition supplements (Nepro vs Ensure) per team 4. RD to continue to follow nutrition care plan  DOCUMENTATION CODES  Per approved criteria   -Severe malnutrition in the context of chronic illness    Assessment:   Intake has slightly improved since last RD visit. Mom at bedside reports that pt was able to drink some of the Nepro when it was ordered. Noted that Nepro was held as of yesterday 2/2 MD suspicion that it was causing diarrhea.  Noted sodium, potassium, phosphorus and magnesium WNL. Continues on Megace  Diet Order:  Dysphagia 3 with thin liquids Supplements: none  Meds: Scheduled Meds:    . amLODipine  10 mg Oral Daily  . carvedilol  12.5 mg Oral BID WC  . hydrALAZINE  25 mg Oral Q8H  . levETIRAcetam  250 mg Oral BID  . lisinopril  40 mg Oral Daily  . megestrol  400 mg Oral BID  . DISCONTD: darbepoetin  60 mcg Subcutaneous Q Wed-1800   Continuous Infusions:    . sodium chloride Stopped (11/29/11 1317)   PRN Meds:.sodium chloride, sodium chloride, acetaminophen, camphor-menthol, diphenhydrAMINE, heparin, hydrALAZINE, hydrOXYzine, lidocaine, lidocaine-prilocaine, ondansetron (ZOFRAN) IV, ondansetron, pentafluoroprop-tetrafluoroeth, sodium chloride  Labs:  CMP     Component Value Date/Time   NA 135 12/07/2011 0433   K 3.8 12/07/2011 0433   CL 100 12/07/2011 0433   CO2 27 12/07/2011 0433   GLUCOSE 84 12/07/2011 0433   BUN 24* 12/07/2011 0433   CREATININE 2.43* 12/07/2011 0433   CREATININE 2.63* 10/12/2011 1230   CALCIUM 8.7 12/07/2011 0433   CALCIUM 8.4 10/28/2011 1327   PROT 6.1 11/29/2011 0224   ALBUMIN 1.7* 12/07/2011 0433   AST 56* 11/29/2011 0224   ALT 6 12/01/2011 2330   ALKPHOS 62 11/29/2011 0224   BILITOT 0.4 11/29/2011 0224   GFRNONAA 27* 12/07/2011 0433   GFRAA 31* 12/07/2011  0433   Sodium  Date/Time Value Range Status  12/07/2011  4:33 AM 135  135 - 145 mEq/L Final  12/06/2011  8:01 AM 136  135 - 145 mEq/L Final  12/05/2011  1:00 PM 139  135 - 145 mEq/L Final    Potassium  Date/Time Value Range Status  12/07/2011  4:33 AM 3.8  3.5 - 5.1 mEq/L Final  12/06/2011  8:01 AM 3.9  3.5 - 5.1 mEq/L Final  12/05/2011  1:00 PM 3.9  3.5 - 5.1 mEq/L Final    Phosphorus  Date/Time Value Range Status  12/07/2011  4:33 AM 2.6  2.3 - 4.6 mg/dL Final  1/61/0960  4:54 AM 2.0* 2.3 - 4.6 mg/dL Final  0/98/1191  4:78 AM 2.1* 2.3 - 4.6 mg/dL Final    Magnesium  Date/Time Value Range Status  12/01/2011  5:00 AM 2.0  1.5 - 2.5 mg/dL Final  05/02/5619  3:08 AM 1.9  1.5 - 2.5 mg/dL Final  08/25/7844  9:62 AM 1.6  1.5 - 2.5 mg/dL Final     Intake/Output Summary (Last 24 hours) at 12/07/11 1347 Last data filed at 12/07/11 0900  Gross per 24 hour  Intake    240 ml  Output      0 ml  Net    240 ml  BM 9/16  Weight Status:  118 lb s/p HD on 9/16 - wt continues to trend down with HD (arrived at 160 lb)  Re-estimated needs (based on need for HD):  1650 - 1900 kcal, 70 - 85 grams protein, 1.2 liters/d  Nutrition Dx:  Inadequate oral intake now r/t decreased attention and lethargy AEB very poor PO intake.  Goal:  Intake to meet >90% of estimated nutrition needs - unmet  Monitor:  Weights, labs, PO intake, I/O's  Jarold Motto MS, RD, LDN Pager: [phone] After-hours pager: [phone]

## 2011-12-07 NOTE — Progress Notes (Signed)
Occupational Therapy Treatment Patient Details Name: Heather Sims MRN: [id number] DOB: 11-13-1990 Today's Date: 12/07/2011 Time: 4098-1191 OT Time Calculation (min): 39 min  OT Assessment / Plan / Recommendation Comments on Treatment Session Pt able to tolerate OT and PT in same morning.  Much more alert today and able to perform UE exercises, trunk activities, and grooming tasks in sitting.    Follow Up Recommendations  Inpatient Rehab    Barriers to Discharge       Equipment Recommendations  Defer to next venue    Recommendations for Other Services Rehab consult  Frequency Min 2X/week   Plan Discharge plan remains appropriate    Precautions / Restrictions Precautions Precautions: Fall Precaution Comments: rectocele Restrictions Weight Bearing Restrictions: No   Pertinent Vitals/Pain No pain    ADL  Grooming: Performed;Teeth care;Wash/dry face;Minimal assistance Where Assessed - Grooming: Supported sitting ADL Comments: Pt up in chair upon OTs entry.  Alert and willing to work with OT even though she had had PT earlier today.  Issued red foam build ups for eating utensil and toothbrush as pt tends to drop items frequently.  Also worked on sitting balance with feet supported while engage in reaching activities and self care. Pt tearful early in session and by end smiling and teasing OT.      OT Diagnosis:    OT Problem List:   OT Treatment Interventions:     OT Goals Acute Rehab OT Goals Time For Goal Achievement: 12/16/11 ADL Goals Pt Will Perform Grooming: Standing at sink;with supervision ADL Goal: Grooming - Progress: Progressing toward goals Pt Will Perform Upper Body Bathing: with supervision;with set-up;Sitting, edge of bed;Sitting, chair Pt Will Perform Lower Body Bathing: Sit to stand from bed;Sit to stand from chair;with supervision Pt Will Perform Upper Body Dressing: with set-up;with supervision;Sitting, bed;Sitting, chair Pt Will Transfer to Toilet:  Ambulation;with DME;with supervision Pt Will Perform Toileting - Hygiene: with set-up;with supervision;Sit to stand from 3-in-1/toilet Additional ADL Goal #1: Pt will perform dynamic/static standing balance activities for greater than or equal to with less than 3 rest breaks in prep for standing ADLs Miscellaneous OT Goals Miscellaneous OT Goal #1:  Pt will perform AROM with 1# wt B UEs x 10 reps all areas. OT Goal: Miscellaneous Goal #1 - Progress: Goal set today  Visit Information  Last OT Received On: 12/07/11 Assistance Needed: +2    Subjective Data      Prior Functioning       Cognition  Overall Cognitive Status: Impaired Area of Impairment: Attention;Memory;Following commands;Safety/judgement;Awareness of deficits Arousal/Alertness: Awake/alert Orientation Level: Appears intact for tasks assessed Behavior During Session: Flat affect Current Attention Level: Sustained Following Commands: Follows one step commands consistently;Follows one step commands with increased time Safety/Judgement: Decreased awareness of need for assistance    Mobility  Shoulder Instructions Bed Mobility Bed Mobility: Not assessed      Exercises  General Exercises - Upper Extremity Shoulder Flexion: 10 reps;Seated;Both (1/2 lb) Shoulder ABduction: 10 reps;Seated;Other (comment);Both (1/2 lb) Elbow Flexion: Both;10 reps;Seated;Other (comment) (1/2 lb) Elbow Extension: Both;10 reps;Seated;Other (comment) (1/2 lb) Wrist Flexion: Both;10 reps;Seated;Other (comment) (1/2 lb)   Balance Balance Balance Assessed: Yes Static Sitting Balance Static Sitting - Balance Support: Feet supported Static Sitting - Level of Assistance: 5: Stand by assistance Static Sitting - Comment/# of Minutes: 5 Dynamic Sitting Balance Dynamic Sitting - Balance Support: Feet supported;During functional activity Dynamic Sitting - Level of Assistance: 5: Stand by assistance Dynamic Sitting - Balance Activities:  Lateral lean/weight  shifting;Forward lean/weight shifting;Reaching for objects   End of Session OT - End of Session Activity Tolerance: Patient tolerated treatment well Patient left: in bed;with call bell/phone within reach;with family/visitor present  GO     [name] 12/07/2011, 12:14 PM [phone]

## 2011-12-07 NOTE — Progress Notes (Signed)
CIR Admissions: Met with pt and her mother to discuss possible admission to CIR. Pt has been pt at CIR in past. Pt was non-verbal with flat affect, responding appropriately with nods. Pt continues with multiple medical issues which have limited her ability to participate fully in therapies/refusing therapies at times.  Encouraged pt to participate in therapies as much as possible while on acute in order to build endurance for inpatient rehab. Pt would not be able to tolerate CIR / 3 hrs of therapy per day at this time. Will continue to follow.  Please call with questions: [phone]

## 2011-12-07 NOTE — Procedures (Signed)
Patient was seen on dialysis and the procedure was supervised.  BFR 400  Via PC BP is  125/86.   Patient appears to be tolerating treatment well  Debra Colon A 12/07/2011

## 2011-12-07 NOTE — Progress Notes (Signed)
Pt still having dialysis treatment, report given to RN in 6700 pt will go to room 6739.

## 2011-12-08 DIAGNOSIS — R627 Adult failure to thrive: Secondary | ICD-10-CM

## 2011-12-08 DIAGNOSIS — R578 Other shock: Secondary | ICD-10-CM

## 2011-12-08 LAB — CBC
HCT: 37.2 % (ref 36.0–46.0)
Hemoglobin: 12.1 g/dL (ref 12.0–15.0)
MCH: 30 pg (ref 26.0–34.0)
MCV: 92.1 fL (ref 78.0–100.0)
Platelets: 196 10*3/uL (ref 150–400)
RBC: 4.04 MIL/uL (ref 3.87–5.11)

## 2011-12-08 LAB — RENAL FUNCTION PANEL
BUN: 10 mg/dL (ref 6–23)
CO2: 31 mEq/L (ref 19–32)
Calcium: 8.7 mg/dL (ref 8.4–10.5)
Chloride: 102 mEq/L (ref 96–112)
Creatinine, Ser: 1.88 mg/dL — ABNORMAL HIGH (ref 0.50–1.10)
Glucose, Bld: 85 mg/dL (ref 70–99)

## 2011-12-08 LAB — FECAL LACTOFERRIN, QUANT

## 2011-12-08 MED ORDER — LISINOPRIL 20 MG PO TABS
20.0000 mg | ORAL_TABLET | Freq: Every day | ORAL | Status: DC
  Administered 2011-12-09: 20 mg via ORAL
  Filled 2011-12-08: qty 1

## 2011-12-08 MED ORDER — SODIUM PHOSPHATE 3 MMOLE/ML IV SOLN
30.0000 mmol | Freq: Once | INTRAVENOUS | Status: AC
  Administered 2011-12-08: 30 mmol via INTRAVENOUS
  Filled 2011-12-08 (×2): qty 10

## 2011-12-08 MED ORDER — HEPARIN SODIUM (PORCINE) 1000 UNIT/ML DIALYSIS
20.0000 [IU]/kg | INTRAMUSCULAR | Status: DC | PRN
  Filled 2011-12-08: qty 1

## 2011-12-08 MED ORDER — DEXTROSE 5 % IV SOLN
1.5000 g | INTRAVENOUS | Status: DC
  Filled 2011-12-08: qty 1.5

## 2011-12-08 NOTE — Progress Notes (Signed)
Physical Therapy Treatment Patient Details Name: Heather Sims MRN: [id number] DOB: Sep 01, 1990 Today's Date: 12/08/2011 Time: 5188-4166 PT Time Calculation (min): 25 min  PT Assessment / Plan / Recommendation Comments on Treatment Session  Patient s/p encephalopathy and multiple medical problems with decr mobility secondary to decr endurance for activity as well as decr safety awareness.  Improved sitting balance today.  Needs to continue PT.   I wonder how much anxiety is a component in decr activity tolerance.  Pt's mother has addressed this concern with Dr. Ardyth Harps;   Continue to believe CIR is a good option for dc -- pt will have consistent 24 hour assist from mother, and if progress with walking is slow, home at mostly wheelchair transfer level after rehab is a viable option, too    Follow Up Recommendations  Inpatient Rehab    Barriers to Discharge        Equipment Recommendations  Defer to next venue    Recommendations for Other Services    Frequency Min 3X/week   Plan Discharge plan remains appropriate;Frequency remains appropriate    Precautions / Restrictions Precautions Precautions: Fall   Pertinent Vitals/Pain "hurts all over" did not rate; repositioned as comfortably as possible in recliner    Mobility  Bed Mobility Bed Mobility: Rolling Left;Left Sidelying to Sit;Sitting - Scoot to Edge of Bed Rolling Left: 4: Min guard;With rail Left Sidelying to Sit: 4: Min guard;HOB elevated;With rails Sitting - Scoot to Edge of Bed: 4: Min guard;With rail Details for Bed Mobility Assistance: Patient able to come to EOB with rail with cues only.  Incr tone in LEs when bringing them off bed. Pt with tremor-like movement of LEs with bed therex and bed mobility, which could be attributed to incr tone, or possibly (perhaps more likely) anxiety Transfers Transfers: Sit to Stand;Stand to Sit Sit to Stand: 1: +2 Total assist;From bed;From chair/3-in-1 Sit to Stand: Patient  Percentage: 30% Stand to Sit: 1: +2 Total assist;To chair/3-in-1 Stand to Sit: Patient Percentage: 20% Details for Transfer Assistance: continued cues for technique, sequencing, and encouragement; Cues and facilitation of initiating sit to stand with anterior weight shift; continues to require bil knee blocking Ambulation/Gait Ambulation/Gait Assistance: 1: +2 Total assist (3rd person present for safety) Ambulation/Gait: Patient Percentage:  (40% declining to 20% with incr time upright on feet) Ambulation Distance (Feet): 3 Feet Assistive device: 2 person hand held assist (3rd person to push chair behind) Ambulation/Gait Assistance Details: Limited by anxiety, weakness; Initially accepting some, though minimal weight onto bil LEs, but with inability to fully extend right and Left knees; stance knee flexion increased with more time on feet; Able to take about 2 steps at a time before needing to sit Gait Pattern: Right flexed knee in stance;Left flexed knee in stance;Shuffle    Exercises Total Joint Exercises Bridges: AROM;10 reps;Supine (Cues for knee control)   PT Diagnosis:    PT Problem List:   PT Treatment Interventions:     PT Goals Acute Rehab PT Goals Time For Goal Achievement: 12/16/11 Potential to Achieve Goals: Good Pt will go Supine/Side to Sit: with modified independence PT Goal: Supine/Side to Sit - Progress: Progressing toward goal Pt will Sit at Edge of Bed: with modified independence;6-10 min;with no upper extremity support PT Goal: Sit at Edge Of Bed - Progress: Progressing toward goal Pt will go Sit to Stand: with modified independence PT Goal: Sit to Stand - Progress: Progressing toward goal Pt will go Stand to Sit: with modified independence  PT Goal: Stand to Sit - Progress: Progressing toward goal Pt will Ambulate: 51 - 150 feet;with supervision;with rolling walker PT Goal: Ambulate - Progress: Progressing toward goal  Extremely slow progress with upright  activity  Visit Information  Last PT Received On: 12/08/11 Assistance Needed:  (+3 to try amb; 3rd person pushing chair)    Subjective Data  Subjective: Agreeable to getting up; Quite anxious re: standing and attempting amb   Cognition  Overall Cognitive Status: Impaired Arousal/Alertness: Awake/alert Orientation Level: Appears intact for tasks assessed Behavior During Session: Flat affect Current Attention Level: Sustained Following Commands: Follows one step commands consistently    Balance  Static Sitting Balance Static Sitting - Balance Support: Feet supported;Left upper extremity supported;Right upper extremity supported Static Sitting - Level of Assistance: 5: Stand by assistance Static Sitting - Comment/# of Minutes: 5 minute EOB and Edge of chair; Worked on forward lean (to help with transfers); Pt leaned forward towards mother sitting in front of her (and gave hugs)  End of Session PT - End of Session Equipment Utilized During Treatment: Gait belt Activity Tolerance: Patient limited by fatigue (anxiety is likely a limiting facotr) Patient left: in chair;with call bell/phone within reach;with family/visitor present Nurse Communication: Mobility status   GP     Olen Pel [place], [place] [phone]  12/08/2011, 11:40 AM

## 2011-12-08 NOTE — Progress Notes (Signed)
Speech Language Pathology Dysphagia Treatment Patient Details Name: Heather Sims MRN: [id number] DOB: 01-06-1991 Today's Date: 12/08/2011 Time: 4098-1191 SLP Time Calculation (min): 25 min  Assessment / Plan / Recommendation Clinical Impression  Treatment focused on diet tolerance assessment. Patient alert, pleasant, and cooperative, intereactive with clincian and agreeable to po trials. Patient verbalizing dislike of current meals and reported that things she requests are being declined given current dietary restrictions. Agreeable to consume po trials for differential diagnosis of abilitites to determine potention to advance. Patient able to consume clinician provided textures without mastication difficulty or overt indication of aspiration. As long as patient alert, may consume a regular diet, thin liquids. Noted most recent CXR noted to be improving. SLP will f/u briefly to ensure that patient is tolerating advanced diet. RN educated on need to ensure that patient is fully alert prior to meal consumption.     Diet Recommendation  Initiate / Change Diet: Regular;Thin liquid    SLP Plan Continue with current plan of care   Pertinent Vitals/Pain n/a   Swallowing Goals  SLP Swallowing Goals Patient will consume recommended diet without observed clinical signs of aspiration with: Modified independent assistance Swallow Study Goal #1 - Progress: Not Met Patient will utilize recommended strategies during swallow to increase swallowing safety with: Modified independent assistance Swallow Study Goal #2 - Progress: Not met  General Temperature Spikes Noted: No Respiratory Status: Room air Behavior/Cognition: Alert;Cooperative;Pleasant mood Oral Cavity - Dentition: Adequate natural dentition Patient Positioning: Upright in bed   Dysphagia Treatment Treatment focused on: Skilled observation of diet tolerance Treatment Methods/Modalities: Skilled observation;Differential  diagnosis Patient observed directly with PO's: Yes Type of PO's observed: Dysphagia 3 (soft);Thin liquids Feeding: Able to feed self Liquids provided via: [employer] MA, CCC-SLP [phone]    Karlisha Mathena [name] 12/08/2011, 4:19 PM

## 2011-12-08 NOTE — Progress Notes (Signed)
[facility] PROGRESS NOTE  Heather Sims ZOX:[id number] DOB: 07-21-1990 DOA: 11/26/2011 PCP: Jaclyn Shaggy, MD  Brief narrative: 21 yo woman, history of CHF (EF 30-35%), CKD (MPGN), presenting 9/5 with encephalopathy, found to have severe ascites and Depakote toxicity- s/p large-volume paracentesis 9/7- subsequently developed ?intra-abdominal bleeding and hemorrhagic vs/ hypovolemic shock, requiring intubation, volume resuscitation, and CVVHD. 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 She was diagnosed with MPGN and has been followed closely by [facility]. Her renal function has declined to a point where she now needs permanent dialysis. As noted above, she presented with significant fluid overload this admission. We are now waiting for CIR admission.  Assessment/Plan:  Anemia presumed to be due to acute intra-abdominal blood loss as a complication of paracentesis S/p transfusion of 8 U prbc, Hgb was noted to jump from 1.3 to 14.6 in a matter of 4 hrs -we are highly suspicious of the accuracy of the Hgb of 1.3. Prior to this her Hgb was 9.0 - Hgb has since been stable.   Hypotension / hypovolemic shock RESOLVED Likely due to fluid shifts from large volume paracentesis in addition to ?hemorrhagic shock- resolved with volume resuscitation/transfusion and now HTN is more of a problem  Acute Oliguric Renal failure on chronic renal failure- MPGN Type 2 dialysis per nephrology via tunneled dialysis catheter - Permanent access is planned for later this week as per Dr. Estanislado Spire last note.  Fever Now afebrile for >72 hours.   Bradycardia Short lived episodes on 9/13 - decreased Coreg - no further episodes of bradycardia noted   Ascites Re-accumulation after large volume paracentesis - no  signs of cirrhosis and therefore likely due to progression of renal failure and hypoalbuminemia  Seizure disorder with Depakote toxicity  Depakote has been discontinued due to toxicity and thrombocytopenia - the patient is currently stable on Keppra   Cardiomyopathy  Ejection fraction 40-45% via echocardiogram January 2013 - repeat ECHO reveals progression of LV and RV systolic dysfunction -  EF of 81-19% and moderate concentric hypertrophy of LV, mild to moderately reduced RV function -B blocker ordered - due to bradycardic episode, Coreg has been decreased - Added ACE I on 9/13   Congested cough/ infiltrate in LLL? PA/Lat CXR performed 9/12 revealed improving infiltrate/atelectasis - Patient no longer complaining of cough but still high risk of aspiration  Dysphagia  Has been closely followed by SLP since her anoxic brain injury in Jan, 2013 - currently on mechanical soft with thin liquids  Acute encephalopathy  CT scan of head without acute findings - ammonia level is within normal range - urinalysis is without suggestion of urinary tract infection - Depakote level was markedly elevated at presentation - perhaps this was primarily Depakote induced lethargy plus uremia - mental status is much improved today   Anoxic brain damage s/p PEA arrest  March 26, 2011 secondary to cardio-respiratory failure from pulmonary edema as a result of acute renal failure   Thrombocytopenia Suspected to be Depakote induced compounded by acute blood loss - her counts have now recovered  HTN currently receiving Coreg - as mentioned above dose was decreased due to episodes of bradycardia, added Lisinopril this admission - She continue to have a significantly elevated diastolic component as well - added Norvasc 9/15 - begin hydralazine  9/16.     Protein-Calorie malnutrition- severe Patient unable to tolerate solids due to lack of appetite - I suspect depression is playing a large part in this as well.    Disposition We are currently pursuing a CIR bed. Possibly for tomorrow 9/18.  Code Status: full code Family Communication: discussed with patient's mother  DVT prophylaxis: SCDs  Consultants:  Nephrology  General surgery  Procedures: R [place] Permcath 9/6 L IJ TLC 9/8  ETT 9/7 R femoral Aline 9/7>>9/9 Paracentesis 9/7  HPI/Subjective: Alert and oriented. Patient's mother is present and has been updated on Heather Sims's plan of care.  Objective: Filed Vitals:   12/07/11 1830 12/07/11 2034 12/08/11 0549 12/08/11 1003  BP: 117/73 130/86 139/91 126/88  Pulse: 87 90 88 98  Temp: 99.1 F (37.3 C) 98.8 F (37.1 C) 98.1 F (36.7 C) 99.9 F (37.7 C)  TempSrc: Oral Oral Oral   Resp: 18 18 18 15   Height:  5\' 2"  (1.575 m)    Weight: 50.2 kg (110 lb 10.7 oz) 50.3 kg (110 lb 14.3 oz)    SpO2:  97% 97% 97%    Intake/Output Summary (Last 24 hours) at 12/08/11 1428 Last data filed at 12/08/11 1003  Gross per 24 hour  Intake    580 ml  Output   4000 ml  Net  -3420 ml    Exam:   General:  Alert, conversant, very fatigued.  Cardiovascular: RRR, no murmurs  Respiratory: CTA b/l w/o wheeze or focal crackles appreciable  Abdomen: soft but protuberent w/ obvious ascites - no focal mass - no rebound  Ext: 1+ B LE edema w/o cyanosis or clubbing   Data Reviewed: Basic Metabolic Panel:  Lab 12/08/11 4098 12/07/11 0433 12/06/11 0801 12/05/11 1300 12/05/11 0630 12/04/11 0420  NA 137 135 136 139 137 --  K 3.6 3.8 3.9 3.9 3.8 --  CL 102 100 102 105 103 --  CO2 31 27 28 28 27  --  GLUCOSE 85 84 83 75 80 --  BUN 10 24* 16 27* 24* --  CREATININE 1.88* 2.43* 2.02* 2.32* 2.29* --  CALCIUM 8.7 8.7 8.6 8.5 8.7 --  MG -- -- -- -- -- --  PHOS 1.6* 2.6 2.0* -- 2.1* 1.9*   Liver Function Tests:  Lab 12/08/11 0540 12/07/11 0433 12/06/11 0801 12/05/11 0630 12/04/11 0420 12/01/11 2330  AST -- -- -- -- -- --  ALT -- -- -- -- -- 6  ALKPHOS -- -- -- -- -- --  BILITOT -- -- -- -- -- --   PROT -- -- -- -- -- --  ALBUMIN 1.9* 1.7* 1.7* 1.7* 1.6* --   CBC:  Lab 12/08/11 0540 12/07/11 0433 12/06/11 0801 12/05/11 0630 12/04/11 0420  WBC 10.2 11.3* 10.1 11.1* 12.4*  NEUTROABS -- -- -- -- --  HGB 12.1 12.3 12.4 12.9 11.7*  HCT 37.2 37.2 37.6 39.6 36.0  MCV 92.1 90.5 90.4 90.4 90.0  PLT 196 204 186 178 118*    Basename 03/26/11 2140  PROBNP 11557.0*   CBG:  Lab 12/05/11 0840 12/04/11 2144 12/04/11 1555 12/04/11 1222 12/04/11 0806  GLUCAP 84 84 82 75 83    Studies: reviewed ________________________________________________________________________  Time spent: 30 min  HERNANDEZ ACOSTA,Lekeshia Kram  [facility] Pager [phone] If 8PM-8AM, please contact night-coverage at [email or web address], password [facility] 12/08/2011, 2:28 PM  LOS: 12 days

## 2011-12-08 NOTE — Progress Notes (Signed)
RN attempted to get surgical consent from patient's Mom for left avfistula vs avgraft placement in the am but Mom is concerned because patient is supposedly going for inpatient rehab and if she has the fistula/graft in the arm,she won't be able to participate fully using arm.Called on call MD,Fields and informed of predicament and was informed to call Dr Imogene Burn in am. Epifania Gore, Tavie Haseman Joselita,RN

## 2011-12-08 NOTE — Progress Notes (Signed)
PA Student Daily Progress Note [facility] Subjective: Pt feels good this morning.  She reports no problems with dialysis yesterday saying that it was "boring."  She says her appetite is a little better.  Her mother reports that her PO intake is starting to pick up a little bit.  Pt denies any problems breathing.   She looks good today  Objective:  Filed Vitals:   12/07/11 1800 12/07/11 1830 12/07/11 2034 12/08/11 0549  BP: 106/77 117/73 130/86 139/91  Pulse: 93 87 90 88  Temp:  99.1 F (37.3 C) 98.8 F (37.1 C) 98.1 F (36.7 C)  TempSrc:  Oral Oral Oral  Resp:  18 18 18   Height:   5\' 2"  (1.575 m)   Weight:  50.2 kg (110 lb 10.7 oz) 50.3 kg (110 lb 14.3 oz)   SpO2:   97% 97%   Physical Exam: General:Pt is alert sitting in bed, no acute distress eating breakfast Cardio:mildly tachycardic, S1, S2, systolic murmer loudest at left 2nd ICS Respiratory:pt breathing normally, without use of accessory muscles.  lungs clear to auscultation bilaterally Abdomen: soft, nontender, still mildly distended but significantly reduced from last week, normoactive BS  Extremities: warm, well perfused, no lower extremity edema  Neurologic: Pt oriented to person and place.  Pt is alert and answers "yes" and "no" questions appropriately.  Pt will verbalize short phrases and sentences.     Assessment/Plan: 1. New ESRD - last HD 12/07/11. Cr continues to trend down at 1.88 today. No urine output recorded.  Originally thought to need HD 4 times/week for fluid mgmt, however looks good today. Will not commit her to that unless she fails other Tunneled HD cath placed on 12/04/11. Vein study in July & plan for AVG placement this week.   Plan for HD MWF this week  2. HTN - BP moderately elevated. Reduced to 139/91 today.  Pt on carvedilol, lisinopril & HD. Much better than baseline but still over goal.  Will stop hydralazine and decrease lisinopril as BP is better. Continue with UF with HD 3. Anoxic  Brain Injury following PEA arrest - Stable, chronic.  Pt's mother reports pt is much improved but not quite back to baseline yet. 4. Cardiomyopathy. Repeat 2D Echo ordered with no change from baseline.  5. MPGN with C3 nephropathy - Aranesp continued.  6. Seizure disorder - Keppra PO.  7.Anemia- not an issue, no ESA needed 8. Bones- last PTH in double digits,no vitamin D needed, will recheck. Phos low, no binders needed.   9. Dispo- PT and eventually home with mother who takes good care of her  10. Hypophosphatemia- Megace started yesterday.  Phos low at 1.6.   Will replete Encouraging PO intake.  Consider replacement if phos remains low    Labs: Basic Metabolic Panel:  Lab 12/08/11 1610 12/07/11 0433 12/06/11 0801  NA 137 135 136  K 3.6 3.8 3.9  CL 102 100 102  CO2 31 27 28   GLUCOSE 85 84 83  BUN 10 24* 16  CREATININE 1.88* 2.43* 2.02*  CALCIUM 8.7 8.7 8.6  ALB -- -- --  PHOS 1.6* 2.6 2.0*   Liver Function Tests:  Lab 12/08/11 0540 12/07/11 0433 12/06/11 0801 12/01/11 2330  AST -- -- -- --  ALT -- -- -- 6  ALKPHOS -- -- -- --  BILITOT -- -- -- --  PROT -- -- -- --  ALBUMIN 1.9* 1.7* 1.7* --   CBC:  Lab 12/08/11 0540 12/07/11 0433 12/06/11  0801 12/05/11 0630 12/04/11 0420  WBC 10.2 11.3* 10.1 -- --  NEUTROABS -- -- -- -- --  HGB 12.1 12.3 12.4 -- --  HCT 37.2 37.2 37.6 -- --  MCV 92.1 90.5 90.4 90.4 90.0  PLT 196 204 186 -- --   Blood Culture    Component Value Date/Time   SDES SPUTUM 12/03/2011 1733   SPECREQUEST NONE 12/03/2011 1733   CULT NO GROWTH 5 DAYS 11/28/2011 2245   REPTSTATUS 12/04/2011 FINAL 12/03/2011 1733   CBG:  Lab 12/05/11 0840 12/04/11 2144 12/04/11 1555 12/04/11 1222 12/04/11 0806  GLUCAP 84 84 82 75 83   Micro Results: Recent Results (from the past 240 hour(s))  BODY FLUID CULTURE     Status: Normal   Collection Time   11/28/11  4:55 PM      Component Value Range Status Comment   Specimen Description     Final    Value: PERITONEAL              Lymphocytosis. If a     lymphoproliferative disorder is     in the clinical differential     diagnosis, fresh unrefrigerated   Special Requests NONE   Final    Gram Stain     Final    Value: MODERATE WBC PRESENT, PREDOMINANTLY MONONUCLEAR     NO ORGANISMS SEEN   Culture NO GROWTH 3 DAYS   Final    Report Status 12/02/2011 FINAL   Final   CULTURE, BLOOD (ROUTINE X 2)     Status: Normal   Collection Time   11/28/11 10:15 PM      Component Value Range Status Comment   Specimen Description BLOOD   Final    Special Requests     Final    Value: BOTTLES DRAWN AEROBIC ONLY 10CC DRAWN FROM LEFT Long,CVC   Culture  Setup Time 11/29/2011 17:11   Final    Culture NO GROWTH 5 DAYS   Final    Report Status 12/05/2011 FINAL   Final   CULTURE, BLOOD (ROUTINE X 2)     Status: Normal   Collection Time   11/28/11 10:45 PM      Component Value Range Status Comment   Specimen Description BLOOD RIGHT HEMODIALYSIS CATHETER   Final    Special Requests BOTTLES DRAWN AEROBIC ONLY 5CC   Final    Culture  Setup Time 11/29/2011 17:11   Final    Culture NO GROWTH 5 DAYS   Final    Report Status 12/05/2011 FINAL   Final   CULTURE, EXPECTORATED SPUTUM-ASSESSMENT     Status: Normal   Collection Time   12/03/11  5:33 PM      Component Value Range Status Comment   Specimen Description SPUTUM   Final    Special Requests NONE   Final    Sputum evaluation     Final    Value: MICROSCOPIC FINDINGS SUGGEST THAT THIS SPECIMEN IS NOT REPRESENTATIVE OF LOWER RESPIRATORY SECRETIONS. PLEASE RECOLLECT.     CALLED TO RN K.PORTER AT 2300 BY L.PITT 12/03/11   Report Status 12/04/2011 FINAL   Final    Studies/Results: No results found. Medications: Scheduled Meds:   . amLODipine  10 mg Oral Daily  . carvedilol  12.5 mg Oral BID WC  . hydrALAZINE  25 mg Oral Q8H  . levETIRAcetam  250 mg Oral BID  . lisinopril  40 mg Oral Daily  . megestrol  400 mg Oral BID  . DISCONTD:  darbepoetin  60 mcg Subcutaneous Q Wed-1800    Continuous Infusions:   . sodium chloride Stopped (11/29/11 1317)   PRN Meds:.sodium chloride, sodium chloride, acetaminophen, camphor-menthol, diphenhydrAMINE, heparin, hydrALAZINE, hydrOXYzine, lidocaine, lidocaine-prilocaine, ondansetron (ZOFRAN) IV, ondansetron, pentafluoroprop-tetrafluoroeth, sodium chloride   This is a Psychologist, occupational Note.  The care of the patient was discussed with Dr. Kathrene Bongo and the assessment and plan formulated with their assistance.  Please see their attached note for official documentation of the daily encounter.  Delmer Islam PA-S2 12/08/2011, 8:07 AM  Patient seen and examined, agree with above note with above modifications.  New start to HD, doing well clinically, plan for next HD tomorrow- plan for permanent access prior to discharge- check with [facility] for plan. .  Anemia is not an issue, phos is low, will replete.  Annie Sable, MD 12/08/2011

## 2011-12-09 ENCOUNTER — Inpatient Hospital Stay (HOSPITAL_COMMUNITY)
Admission: RE | Admit: 2011-12-09 | Discharge: 2011-12-19 | DRG: 939 | Disposition: A | Discharge status: Home / Self Care | Payer: Medicaid Other | Source: Ambulatory Visit | Attending: Physical Medicine & Rehabilitation | Admitting: Physical Medicine & Rehabilitation

## 2011-12-09 ENCOUNTER — Encounter (HOSPITAL_COMMUNITY): Payer: Self-pay | Admitting: *Deleted

## 2011-12-09 ENCOUNTER — Encounter (HOSPITAL_COMMUNITY)
Admission: EM | Disposition: A | Discharge status: Other Acute Inpatient Hospital | Payer: Self-pay | Source: Home / Self Care | Attending: Pulmonary Disease

## 2011-12-09 ENCOUNTER — Inpatient Hospital Stay (HOSPITAL_COMMUNITY): Payer: Medicaid Other

## 2011-12-09 ENCOUNTER — Encounter (HOSPITAL_COMMUNITY): Payer: Self-pay | Admitting: Anesthesiology

## 2011-12-09 DIAGNOSIS — D649 Anemia, unspecified: Secondary | ICD-10-CM | POA: Diagnosis present

## 2011-12-09 DIAGNOSIS — I120 Hypertensive chronic kidney disease with stage 5 chronic kidney disease or end stage renal disease: Secondary | ICD-10-CM | POA: Diagnosis present

## 2011-12-09 DIAGNOSIS — Z5189 Encounter for other specified aftercare: Principal | ICD-10-CM

## 2011-12-09 DIAGNOSIS — I252 Old myocardial infarction: Secondary | ICD-10-CM

## 2011-12-09 DIAGNOSIS — Z8669 Personal history of other diseases of the nervous system and sense organs: Secondary | ICD-10-CM | POA: Diagnosis present

## 2011-12-09 DIAGNOSIS — N189 Chronic kidney disease, unspecified: Secondary | ICD-10-CM | POA: Diagnosis present

## 2011-12-09 DIAGNOSIS — G931 Anoxic brain damage, not elsewhere classified: Secondary | ICD-10-CM

## 2011-12-09 DIAGNOSIS — R630 Anorexia: Secondary | ICD-10-CM | POA: Diagnosis present

## 2011-12-09 DIAGNOSIS — I428 Other cardiomyopathies: Secondary | ICD-10-CM | POA: Diagnosis present

## 2011-12-09 DIAGNOSIS — Z992 Dependence on renal dialysis: Secondary | ICD-10-CM

## 2011-12-09 DIAGNOSIS — R5381 Other malaise: Secondary | ICD-10-CM | POA: Diagnosis present

## 2011-12-09 DIAGNOSIS — G9340 Encephalopathy, unspecified: Secondary | ICD-10-CM | POA: Diagnosis present

## 2011-12-09 DIAGNOSIS — N186 End stage renal disease: Secondary | ICD-10-CM

## 2011-12-09 DIAGNOSIS — G40309 Generalized idiopathic epilepsy and epileptic syndromes, not intractable, without status epilepticus: Secondary | ICD-10-CM | POA: Diagnosis present

## 2011-12-09 DIAGNOSIS — N179 Acute kidney failure, unspecified: Secondary | ICD-10-CM | POA: Diagnosis present

## 2011-12-09 DIAGNOSIS — R000 Tachycardia, unspecified: Secondary | ICD-10-CM | POA: Diagnosis present

## 2011-12-09 DIAGNOSIS — E213 Hyperparathyroidism, unspecified: Secondary | ICD-10-CM | POA: Diagnosis present

## 2011-12-09 DIAGNOSIS — G40909 Epilepsy, unspecified, not intractable, without status epilepticus: Secondary | ICD-10-CM | POA: Diagnosis present

## 2011-12-09 DIAGNOSIS — N059 Unspecified nephritic syndrome with unspecified morphologic changes: Secondary | ICD-10-CM | POA: Diagnosis present

## 2011-12-09 LAB — CBC
HCT: 39.7 % (ref 36.0–46.0)
Hemoglobin: 12.9 g/dL (ref 12.0–15.0)
MCH: 29.7 pg (ref 26.0–34.0)
MCV: 91.3 fL (ref 78.0–100.0)
RBC: 4.35 MIL/uL (ref 3.87–5.11)

## 2011-12-09 LAB — RENAL FUNCTION PANEL
Albumin: 1.9 g/dL — ABNORMAL LOW (ref 3.5–5.2)
BUN: 16 mg/dL (ref 6–23)
Chloride: 100 mEq/L (ref 96–112)
Creatinine, Ser: 2.36 mg/dL — ABNORMAL HIGH (ref 0.50–1.10)
Glucose, Bld: 81 mg/dL (ref 70–99)

## 2011-12-09 LAB — SURGICAL PCR SCREEN: Staphylococcus aureus: POSITIVE — AB

## 2011-12-09 SURGERY — INSERTION OF ARTERIOVENOUS (AV) GORE-TEX GRAFT ARM
Anesthesia: Monitor Anesthesia Care | Site: Arm Lower | Laterality: Left

## 2011-12-09 MED ORDER — PENTAFLUOROPROP-TETRAFLUOROETH EX AERO: 1.0000 "application " | INHALATION_SPRAY | CUTANEOUS | Status: DC | PRN

## 2011-12-09 MED ORDER — ONDANSETRON HCL 4 MG/2ML IJ SOLN
INTRAMUSCULAR | Status: AC
  Administered 2011-12-09: 4 mg
  Filled 2011-12-09: qty 2

## 2011-12-09 MED ORDER — PROCHLORPERAZINE EDISYLATE 5 MG/ML IJ SOLN
5.0000 mg | Freq: Four times a day (QID) | INTRAMUSCULAR | Status: DC | PRN
  Filled 2011-12-09: qty 2

## 2011-12-09 MED ORDER — PROCHLORPERAZINE MALEATE 5 MG PO TABS
5.0000 mg | ORAL_TABLET | Freq: Four times a day (QID) | ORAL | Status: DC | PRN
  Filled 2011-12-09: qty 2

## 2011-12-09 MED ORDER — HYDROXYZINE HCL 25 MG PO TABS
25.0000 mg | ORAL_TABLET | Freq: Three times a day (TID) | ORAL | Status: DC | PRN
  Filled 2011-12-09: qty 1

## 2011-12-09 MED ORDER — ONDANSETRON HCL 4 MG PO TABS
4.0000 mg | ORAL_TABLET | Freq: Four times a day (QID) | ORAL | Status: DC | PRN
  Administered 2011-12-17: 4 mg via ORAL
  Filled 2011-12-09: qty 1

## 2011-12-09 MED ORDER — HEPARIN SODIUM (PORCINE) 1000 UNIT/ML DIALYSIS
1000.0000 [IU] | INTRAMUSCULAR | Status: DC | PRN
  Filled 2011-12-09: qty 1

## 2011-12-09 MED ORDER — MEGESTROL ACETATE 40 MG/ML PO SUSP
400.0000 mg | Freq: Two times a day (BID) | ORAL | Status: DC
  Administered 2011-12-09 – 2011-12-13 (×7): 400 mg via ORAL
  Filled 2011-12-09 (×14): qty 10

## 2011-12-09 MED ORDER — LIDOCAINE HCL (PF) 1 % IJ SOLN
5.0000 mL | INTRAMUSCULAR | Status: DC | PRN
  Filled 2011-12-09: qty 5

## 2011-12-09 MED ORDER — BISACODYL 10 MG RE SUPP: 10.0000 mg | Freq: Every day | RECTAL | Status: DC | PRN

## 2011-12-09 MED ORDER — FLEET ENEMA 7-19 GM/118ML RE ENEM: 1.0000 | ENEMA | Freq: Once | RECTAL | Status: AC | PRN

## 2011-12-09 MED ORDER — DIPHENHYDRAMINE HCL 50 MG/ML IJ SOLN: 25.0000 mg | Freq: Four times a day (QID) | INTRAMUSCULAR | Status: DC | PRN

## 2011-12-09 MED ORDER — PROCHLORPERAZINE 25 MG RE SUPP
12.5000 mg | Freq: Four times a day (QID) | RECTAL | Status: DC | PRN
  Filled 2011-12-09: qty 1

## 2011-12-09 MED ORDER — ONDANSETRON HCL 4 MG/2ML IJ SOLN: 4.0000 mg | Freq: Four times a day (QID) | INTRAMUSCULAR | Status: DC | PRN

## 2011-12-09 MED ORDER — HYDROXYZINE HCL 25 MG PO TABS: 25.0000 mg | ORAL_TABLET | Freq: Three times a day (TID) | ORAL | Status: DC | PRN

## 2011-12-09 MED ORDER — METHOCARBAMOL 500 MG PO TABS: 500.0000 mg | ORAL_TABLET | Freq: Four times a day (QID) | ORAL | Status: DC | PRN

## 2011-12-09 MED ORDER — CARVEDILOL 12.5 MG PO TABS
12.5000 mg | ORAL_TABLET | Freq: Two times a day (BID) | ORAL | Status: DC
  Administered 2011-12-10 – 2011-12-17 (×15): 12.5 mg via ORAL
  Filled 2011-12-09 (×20): qty 1

## 2011-12-09 MED ORDER — MEGESTROL ACETATE 40 MG/ML PO SUSP: 400.0000 mg | Freq: Two times a day (BID) | ORAL | Status: DC

## 2011-12-09 MED ORDER — POLYETHYLENE GLYCOL 3350 17 G PO PACK
17.0000 g | PACK | Freq: Every day | ORAL | Status: DC | PRN
  Filled 2011-12-09: qty 1

## 2011-12-09 MED ORDER — LIDOCAINE-PRILOCAINE 2.5-2.5 % EX CREA
1.0000 "application " | TOPICAL_CREAM | CUTANEOUS | Status: DC | PRN
  Filled 2011-12-09: qty 5

## 2011-12-09 MED ORDER — ENSURE COMPLETE PO LIQD
237.0000 mL | Freq: Two times a day (BID) | ORAL | Status: DC
  Administered 2011-12-10 – 2011-12-14 (×7): 237 mL via ORAL

## 2011-12-09 MED ORDER — CARVEDILOL 12.5 MG PO TABS: 12.5000 mg | ORAL_TABLET | Freq: Two times a day (BID) | ORAL | Status: DC

## 2011-12-09 MED ORDER — LISINOPRIL 20 MG PO TABS
20.0000 mg | ORAL_TABLET | Freq: Every day | ORAL | Status: DC
  Administered 2011-12-10 – 2011-12-13 (×4): 20 mg via ORAL
  Filled 2011-12-09 (×7): qty 1

## 2011-12-09 MED ORDER — GUAIFENESIN-DM 100-10 MG/5ML PO SYRP: 5.0000 mL | ORAL_SOLUTION | Freq: Four times a day (QID) | ORAL | Status: DC | PRN

## 2011-12-09 MED ORDER — LEVETIRACETAM 250 MG PO TABS
250.0000 mg | ORAL_TABLET | Freq: Two times a day (BID) | ORAL | Status: DC
  Administered 2011-12-09 – 2011-12-19 (×20): 250 mg via ORAL
  Filled 2011-12-09 (×22): qty 1

## 2011-12-09 MED ORDER — HEPARIN SODIUM (PORCINE) 1000 UNIT/ML DIALYSIS
20.0000 [IU]/kg | INTRAMUSCULAR | Status: DC | PRN
  Filled 2011-12-09: qty 1

## 2011-12-09 MED ORDER — CAMPHOR-MENTHOL 0.5-0.5 % EX LOTN
TOPICAL_LOTION | CUTANEOUS | Status: DC | PRN
  Administered 2011-12-12: 09:00:00 via TOPICAL
  Filled 2011-12-09: qty 222

## 2011-12-09 MED ORDER — ALUM & MAG HYDROXIDE-SIMETH 200-200-20 MG/5ML PO SUSP
30.0000 mL | ORAL | Status: DC | PRN
  Administered 2011-12-16: 30 mL via ORAL
  Filled 2011-12-09: qty 30

## 2011-12-09 MED ORDER — AMLODIPINE BESYLATE 10 MG PO TABS
10.0000 mg | ORAL_TABLET | Freq: Every day | ORAL | Status: DC
  Administered 2011-12-10 – 2011-12-11 (×2): 10 mg via ORAL
  Filled 2011-12-09 (×5): qty 1

## 2011-12-09 NOTE — Plan of Care (Signed)
Overall Plan of Care [facility]) Patient Details Name: Heather Sims MRN: [id number] DOB: [birth year]  Diagnosis:  Acute encephalopathy secondary to dep. Toxicity on keppra  Primary Diagnosis:    Encephalopathy acute Co-morbidities: ESRD, myoclonus  Functional Problem List  Patient demonstrates impairments in the following areas: Balance, Cognition, Endurance, Motor, Perception and Safety  Basic ADL's: eating, grooming, bathing, dressing and toileting Advanced ADL's: simple meal preparation  Transfers:  bed mobility, bed to chair, toilet, tub/shower, car and furniture Locomotion:  ambulation, wheelchair mobility and stairs  Additional Impairments:  Functional use of upper extremity, Communication  comprehension and expression, Social Cognition   social interaction, problem solving, memory, attention and awareness, Leisure Awareness and Discharge Disposition  Anticipated Outcomes Item Anticipated Outcome  Eating/Swallowing    Basic self-care  Min A  Tolieting  Min A  Bowel/Bladder  Continent bowel/bladder  Transfers  min assist  Locomotion  Min A  Communication  Min A  Cognition  Min A  Pain  Pain less than 2  Safety/Judgment  mininal assistance  Other     Therapy Plan: PT Frequency: 1-2 X/day, 60-90 minutes OT Frequency: 1-2 X/day, 60-90 minutes SLP Frequency: 1-2 X/day, 30-60 minutes   Team Interventions: Item RN PT OT SLP SW TR Other  Self Care/Advanced ADL Retraining   x      Neuromuscular Re-Education  x x      Therapeutic Activities  x x x     UE/LE Strength Training/ROM  x x      UE/LE Coordination Activities  x x      Visual/Perceptual Remediation/Compensation         DME/Adaptive Equipment Instruction  x x      Therapeutic Exercise  x x      Balance/Vestibular Training  x x      Patient/Family Education  x x x     Cognitive Remediation/Compensation  x x x     Functional Mobility Training  x x      Ambulation/Gait Training  x x      Stair Training   x       Wheelchair Propulsion/Positioning  x       Functional Tourist information centre manager Reintegration  x x      Dysphagia/Aspiration Landscape architect Facilitation    x     Bladder Management x        Bowel Management x        Disease Management/Prevention         Pain Management x x       Medication Management x        Skin Care/Wound Management         Splinting/Orthotics  x       Discharge Planning  x x x     Psychosocial Support   x x                        Team Discharge Planning: Destination:  Home Projected Follow-up:  PT, OT, SLP and Home Health Projected Equipment Needs:  Walker Patient/family involved in discharge planning:  Yes  MD ELOS: 2-3 weeks Medical Rehab Prognosis:  Excellent Assessment: Pt admitted for CIR therapies. The team will be addressing self-care, fxnl mobility, CPT, NMR, balance, safety, cognition. Goals are min assist

## 2011-12-09 NOTE — Progress Notes (Signed)
OR staff called again to ask RN if patient is ready to go to OR.Informed OR RN that consent is still not signed and that Dr Imogene Burn has been paged 4x but didn't get a call back yet.OR RN to try to contact Dr. Imogene Burn.Spoke again to patient's Mom,according to her,she is not refusing to sign consent ,she is just concerned how she will do rehab if she has a new avgraft/avfistula.Patient won't be able to have maximal use of affected arm.States "I hope they listen". Incoming RN made aware. Ruperto Kiernan Joselita,RN

## 2011-12-09 NOTE — Progress Notes (Signed)
Received notice from Dr Ashley Royalty that pt to be discharged today for admission to inpatient rehab. Call for questions: [phone]

## 2011-12-09 NOTE — Progress Notes (Signed)
Report called to receiving RN. Pt transferred to inpt rehab. Pt remains stable. Belongings sent with pt. [place], Rosanna Randy

## 2011-12-09 NOTE — Progress Notes (Signed)
PA Student Daily Progress Note [facility] Subjective: Pt has a headache this morning and feels "sick".  Pt is lethargic, has not gotten up for breakfast today.  Seen on HD  Objective:  Filed Vitals:   12/08/11 1003 12/08/11 1657 12/08/11 2129 12/09/11 0526  BP: 126/88 127/83 129/83 151/80  Pulse: 98 89 89 99  Temp: 99.9 F (37.7 C) 99 F (37.2 C) 99.5 F (37.5 C) 99 F (37.2 C)  TempSrc:   Oral Oral  Resp: 15 16 16 16   Height:   5\' 2"  (1.575 m)   Weight:   50.2 kg (110 lb 10.7 oz)   SpO2: 97% 98% 99% 99%   Physical Exam: General:Pt is lethargic, laying in bed, no acute distress.   Cardio: tachycardic, S1, S2, systolic murmer loudest at left 2nd ICS  Respiratory:pt breathing normally, without use of accessory muscles. lungs clear to auscultation bilaterally  Abdomen: soft, nontender, still mildly distended but significantly reduced from last week, noted active BS  Extremities: warm, well perfused, no lower extremity edema  Neurologic: Pt oriented to person and place. Pt is less alert this am. Pt will verbalize short phrases and sentences.   Assessment/Plan: 1. New ESRD - last HD 12/07/11. Scheduled for HD today.  Cr slightly increased at 2.36 today. Sodium trending down at 134, will watch after HD today.  No urine output recorded. Originally thought to need HD 4 times/week for fluid mgmt, however will try Monday Wednesday, Friday this week.  Tunneled HD cath placed on 12/04/11. Vein study in July & plan for AVG placement now being put off since patient will be going to rehab today.  PT was scheduled for AV fistula today but her mother refused to sign the consent because she was under the impression that Heather Sims would be going to rehab and would need full use of her arm.  Discussed this with her mother.  PT needs fistula placement just  before discharge if going home. So plan is for her to go to rehab today and will look to place permanent access just prior to discharge from  rehab.  2. HTN - BP moderately elevated. 151/88 today. Will check after HD.  Pt on carvedilol, lisinopril & HD. Much better than baseline but still over goal. Stopped hydralazine yesterday and decreased lisinopril to make room for continued volume removal. Continue with UF with HD  3. Anoxic Brain Injury following PEA arrest - Stable, chronic. Pt's mother reports pt is much improved but not quite back to baseline yet.  4. Cardiomyopathy. Repeat 2D Echo ordered with no change from baseline.  5. MPGN with C3 nephropathy - Aranesp continued.  6. Seizure disorder - Keppra PO.  7.Anemia- not an issue, no ESA needed  8. Bones- last PTH in double digits,no vitamin D needed. Will recheck PTH.  Phos 3, no binders needed.  9. Dispo- PT and eventually home with mother who takes good care of her  10. Hypophosphatemia- Megace started yesterday. Phos increased to 3 today. Will monitor phos after HD. Encouraging PO intake. No binder needed  Labs: Basic Metabolic Panel:  Lab 12/08/11 1610 12/07/11 0433 12/06/11 0801  NA 137 135 136  K 3.6 3.8 3.9  CL 102 100 102  CO2 31 27 28   GLUCOSE 85 84 83  BUN 10 24* 16  CREATININE 1.88* 2.43* 2.02*  CALCIUM 8.7 8.7 8.6  ALB -- -- --  PHOS 1.6* 2.6 2.0*   Liver Function Tests:  Lab 12/08/11 0540 12/07/11  1610 12/06/11 0801  AST -- -- --  ALT -- -- --  ALKPHOS -- -- --  BILITOT -- -- --  PROT -- -- --  ALBUMIN 1.9* 1.7* 1.7*   CBC:  Lab 12/08/11 0540 12/07/11 0433 12/06/11 0801 12/05/11 0630 12/04/11 0420  WBC 10.2 11.3* 10.1 -- --  NEUTROABS -- -- -- -- --  HGB 12.1 12.3 12.4 -- --  HCT 37.2 37.2 37.6 -- --  MCV 92.1 90.5 90.4 90.4 90.0  PLT 196 204 186 -- --   Blood Culture    Component Value Date/Time   SDES STOOL 12/06/2011 1135   SPECREQUEST NONE 12/06/2011 1135   CULT NO GROWTH 5 DAYS 11/28/2011 2245   REPTSTATUS 12/08/2011 FINAL 12/06/2011 1135   CBG:  Lab 12/05/11 0840 12/04/11 2144 12/04/11 1555 12/04/11 1222 12/04/11 0806  GLUCAP 84  84 82 75 83   Micro Results: Recent Results (from the past 240 hour(s))  CULTURE, EXPECTORATED SPUTUM-ASSESSMENT     Status: Normal   Collection Time   12/03/11  5:33 PM      Component Value Range Status Comment   Specimen Description SPUTUM   Final    Special Requests NONE   Final    Sputum evaluation     Final    Value: MICROSCOPIC FINDINGS SUGGEST THAT THIS SPECIMEN IS NOT REPRESENTATIVE OF LOWER RESPIRATORY SECRETIONS. PLEASE RECOLLECT.     CALLED TO RN K.PORTER AT 2300 BY L.PITT 12/03/11   Report Status 12/04/2011 FINAL   Final    Medications: Scheduled Meds:   . amLODipine  10 mg Oral Daily  . carvedilol  12.5 mg Oral BID WC  . cefUROXime (ZINACEF)  IV  1.5 g Intravenous On Call to OR  . levETIRAcetam  250 mg Oral BID  . lisinopril  20 mg Oral Daily  . megestrol  400 mg Oral BID  . sodium phosphate  Dextrose 5% IVPB  30 mmol Intravenous Once  . DISCONTD: hydrALAZINE  25 mg Oral Q8H  . DISCONTD: lisinopril  40 mg Oral Daily   Continuous Infusions:   . sodium chloride Stopped (11/29/11 1317)   PRN Meds:.sodium chloride, sodium chloride, acetaminophen, camphor-menthol, diphenhydrAMINE, heparin, heparin, hydrALAZINE, hydrOXYzine, lidocaine, lidocaine-prilocaine, ondansetron (ZOFRAN) IV, ondansetron, pentafluoroprop-tetrafluoroeth, sodium chloride   This is a Psychologist, occupational Note.  The care of the patient was discussed with Dr. Kathrene Bongo and the assessment and plan formulated with their assistance.  Please see their attached note for official documentation of the daily encounter.  Delmer Islam PA-S2 12/09/2011, 8:03 AM  Patient seen and examined, agree with above note with above modifications. Tolerating dialysis well and in fact has perked up quite a bit.  Continue with HD on MWF via PC.  Plan is for her to go to rehab and will get a permanent access just prior to discharge.  We will contact [facility] when appropriate sorry for the inconvenience.  Annie Sable,  MD 12/09/2011

## 2011-12-09 NOTE — Progress Notes (Signed)
Pt received to room 4004 via bed; VSS. Alert, denies pain. Report to eve shift. Carlean Purl

## 2011-12-09 NOTE — Procedures (Signed)
Patient was seen on dialysis and the procedure was supervised.  BFR 400  Via PC BP is  148/97.   Patient appears to be tolerating treatment well  Diangelo Radel A 12/09/2011

## 2011-12-09 NOTE — H&P (Signed)
Physical Medicine and Rehabilitation Admission H&P  Chief Complaint   Patient presents with   .  Deconditioned due to recent metabolic encephalopathy superimposed on anoxic encephalopathy.   :  HPI: Heather Sims is a 21 y.o. female withhistory of C3 nephropathy, anoxic brain injury ( well known to CIR from prior stay), seizure, cardiomyopathy, hypertension with history of somnolence 4-5 days PTA and found to be hypoxic by [facility]. Admitted for workup on 09/05/. Patient's ammonia level was found to be high one week PTA by patient's neurologist and was started on Keppra and the plan was to taper off Depakote. Work up revealed elevated Depakote, chest x-ray shows infiltrates concerning for pneumonia and tense ascites with increased lower extremity fluid. HCAP treated with IV antibiotics. Renal consulted for input and recommended dialysis for acute on chronic renal failure and volume overload. Patient underwent with tense ascites and underwent 10 Liters paracentesis but developed worsening of hypotension with decreased MS and required intubation on 09/07. She developed ABLA due to intra-abdominal bleed with hgb 1.3 with thrombocytopenia and transfused with 8 U pRBC, 4 plts, and 4 FFP. Extubated on 09/10. Required CVVH and tunneled HD cath placed due to HD needs. Creatinine on downward trend but requiring HD MWF. Family has refuse dialysis cath for now--plans for placement in the future once family ready to proceed. needs encouragement for po Intake. Patient noted to be deconditioned with continued cognitive deficits. Therapy team recommending CIR.  Review of Systems  Respiratory: Negative for cough.  Cardiovascular: Negative for chest pain and palpitations.  Gastrointestinal: Negative for abdominal pain.  Musculoskeletal: Negative for myalgias.   Past Medical History   Diagnosis  Date   .  Pneumonia      'walking pneumonia'   .  Seizures    .  Anemia    .  Heart attack    .  Cardiomyopathy    .   Heart failure    .  CHF (congestive heart failure)    .  Ileitis    .  Chronic kidney disease    .  Renal disorder    .  MPGN (membranoproliferative glomerulonephritis), type 2    .  Hypertension    .  Brain injury     Past Surgical History   Procedure  Date   .  Renal biopsy     Family History   Problem  Relation  Age of Onset   .  Hypertension  Mother    .  Diabetes  Father     Social History: lives mother and required 24 hours supervision PTA. Per reports that she has never smoked. She has never used smokeless tobacco. She reports that she does not drink alcohol or use illicit drugs.  Allergies: No Known Allergies  Scheduled Meds:  .  amLODipine  10 mg  Oral  Daily   .  carvedilol  12.5 mg  Oral  BID WC   .  cefUROXime (ZINACEF) IV  1.5 g  Intravenous  On Call to OR   .  levETIRAcetam  250 mg  Oral  BID   .  lisinopril  20 mg  Oral  Daily   .  megestrol  400 mg  Oral  BID   .  ondansetron      .  sodium phosphate Dextrose 5% IVPB  30 mmol  Intravenous  Once    Medications Prior to Admission   Medication  Sig  Dispense  Refill   .  amLODipine (NORVASC) 10 MG tablet  Take 10 mg by mouth daily.     .  carvedilol (COREG) 25 MG tablet  Take 25 mg by mouth 2 (two) times daily with a meal.     .  clonazePAM (KLONOPIN) 0.5 MG tablet  Take 0.5 mg by mouth 2 (two) times daily.     .  darbepoetin (ARANESP) 60 MCG/0.3ML SOLN  Inject 60 mcg into the skin every Wednesday at 6 PM.     .  divalproex (DEPAKOTE) 250 MG DR tablet  Take 750 mg by mouth 3 (three) times daily.     .  ferrous sulfate 325 (65 FE) MG tablet  Take 325 mg by mouth 3 (three) times daily.     .  furosemide (LASIX) 80 MG tablet  Take 240 mg by mouth 2 (two) times daily.     Marland Kitchen  levETIRAcetam (KEPPRA) 250 MG tablet  Take 250 mg by mouth 2 (two) times daily.     Marland Kitchen  losartan (COZAAR) 100 MG tablet  Take 100 mg by mouth daily.     .  sodium bicarbonate 650 MG tablet  Take 650 mg by mouth 3 (three) times daily.      Home:    Home Living  Lives With: Family (mother)  Available Help at Discharge: Family;Available 24 hours/day  Type of Home: House  Home Access: Stairs to enter  [employer] of Steps: 2  Home Layout: One level  Bathroom Shower/Tub: Tub/shower unit;Curtain  Bathroom Toilet: Standard  Home Adaptive Equipment: Bedside commode/3-in-1;Tub transfer bench;Walker - rolling;Wheelchair - manual  Functional History:  Prior Function  Bath: Minimal  Toileting: Minimal  Dressing: Minimal  Meal Prep: Total  Light Housekeeping: Total  Able to Take Stairs?: No  Driving: No  Vocation: On disability  Functional Status:  Mobility:  Bed Mobility  Bed Mobility: Rolling Left;Left Sidelying to Sit;Sitting - Scoot to Edge of Bed  Rolling Right: Not tested (comment)  Rolling Left: 4: Min guard;With rail  Right Sidelying to Sit: Not tested (comment)  Left Sidelying to Sit: 4: Min guard;HOB elevated;With rails  Sitting - Scoot to Edge of Bed: 4: Min guard;With rail  Sit to Supine: Not Tested (comment)  Sit to Supine: Patient Percentage: 60%  Transfers  Transfers: Sit to Stand;Stand to Sit  Sit to Stand: 1: +2 Total assist;From bed;From chair/3-in-1  Sit to Stand: Patient Percentage: 30%  Stand to Sit: 1: +2 Total assist;To chair/3-in-1  Stand to Sit: Patient Percentage: 20%  Stand Pivot Transfers: 2: [employer] manager Transfers: Patient Percentage: 30%  Squat Pivot Transfers: Not tested (comment)  Ambulation/Gait  Ambulation/Gait Assistance: 1: +2 Total assist (3rd person present for safety)  Ambulation/Gait: Patient Percentage: (40% declining to 20% with incr time upright on feet)  Ambulation Distance (Feet): 3 Feet  Assistive device: 2 person hand held assist (3rd person to push chair behind)  Ambulation/Gait Assistance Details: Limited by anxiety, weakness; Initially accepting some, though minimal weight onto bil LEs, but with inability to fully extend right and Left knees; stance knee  flexion increased with more time on feet; Able to take about 2 steps at a time before needing to sit  Gait Pattern: Right flexed knee in stance;Left flexed knee in stance;Shuffle  Stairs: No  Wheelchair Mobility  Wheelchair Mobility: No  ADL:  ADL  Grooming: Performed;Teeth care;Wash/dry face;Minimal assistance  Where Assessed - Grooming: Supported sitting  Lower Body Bathing: Simulated;+2 Total assistance  Where Assessed - Lower  Body Bathing: Supported sit to stand  Upper Body Dressing: Performed;Moderate assistance  Where Assessed - Upper Body Dressing: Supported sitting  Lower Body Dressing: Performed;+2 Total assistance  Where Assessed - Lower Body Dressing: Supported sit to Scientist, research (life sciences): Simulated;+2 Total assistance  Toilet Transfer Method: Archivist: (bed to chair)  Equipment Used: Gait belt  Transfers/Ambulation Related to ADLs: need for bil knee blocking.  ADL Comments: Pt up in chair upon OTs entry. Alert and willing to work with OT even though she had had PT earlier today. Issued red foam build ups for eating utensil and toothbrush as pt tends to drop items frequently. Also worked on sitting balance with feet supported while engage in reaching activities and self care. Pt tearful early in session and by end smiling and teasing OT.  Cognition:  Cognition  Arousal/Alertness: Awake/alert  Orientation Level: Oriented to person;Oriented to place;Oriented to time  Cognition  Overall Cognitive Status: Impaired  Area of Impairment: Attention;Memory;Following commands;Safety/judgement;Awareness of deficits  Arousal/Alertness: Awake/alert  Orientation Level: Appears intact for tasks assessed  Behavior During Session: Flat affect  Current Attention Level: Sustained  Following Commands: Follows one step commands consistently  Safety/Judgement: Decreased awareness of need for assistance  Blood pressure 127/87, pulse 92, temperature 98.8 F (37.1  C), temperature source Oral, resp. rate 15, height 5\' 2"  (1.575 m), weight 49.3 kg (108 lb 11 oz), last menstrual period 11/30/2011, SpO2 98.00%.  Physical Exam  Nursing note and vitals reviewed.  Constitutional: She appears well-developed.  Thin female.  HENT:  Head: Normocephalic.  Eyes: Pupils are equal, round, and reactive to light.  Cardiovascular: Regular rhythm. Tachycardia present.  Pulmonary/Chest: Effort normal. She has decreased breath sounds in the left lower field.  Abdominal: Soft. Bowel sounds are normal. She exhibits no distension. There is no tenderness.  Musculoskeletal: She exhibits no edema and no tenderness.  Spasticity BLE >BUE on testing.  Neurological: She is alert.  Follows basic commands. Speech simple and childlike but more spontaneous. BLE grossly spastic on testing extending to trunk with strength testing. BUE with intentional tremors.   RENAL FUNCTION PANEL Status: Abnormal    Collection Time    12/09/11 8:45 AM   Component  Value  Range  Comment    Sodium  134 (*)  135 - 145 mEq/L     Potassium  3.8  3.5 - 5.1 mEq/L     Chloride  100  96 - 112 mEq/L     CO2  29  19 - 32 mEq/L     Glucose, Bld  81  70 - 99 mg/dL     BUN  16  6 - 23 mg/dL     Creatinine, Ser  4.54 (*)  0.50 - 1.10 mg/dL     Calcium  9.0  8.4 - 10.5 mg/dL     Phosphorus  3.0  2.3 - 4.6 mg/dL     Albumin  1.9 (*)  3.5 - 5.2 g/dL     GFR calc non Af Amer  28 (*)  >90 mL/min     GFR calc Af Amer  33 (*)  >90 mL/min    CBC Status: Normal    Collection Time    12/09/11 8:45 AM   Component  Value  Range  Comment    WBC  9.2  4.0 - 10.5 K/uL     RBC  4.35  3.87 - 5.11 MIL/uL     Hemoglobin  12.9  12.0 -  15.0 g/dL     HCT  46.9  62.9 - 52.8 %     MCV  91.3  78.0 - 100.0 fL     MCH  29.7  26.0 - 34.0 pg     MCHC  32.5  30.0 - 36.0 g/dL     RDW  41.3  24.4 - 01.0 %     Platelets  219  150 - 400 K/uL    PROTIME-INR Status: Normal    Collection Time    12/09/11 8:45 AM   Component  Value   Range  Comment    Prothrombin Time  14.3  11.6 - 15.2 seconds     INR  1.13  0.00 - 1.49    No results found.  Post Admission Physician Evaluation:  1. Functional deficits secondary to deconditioning, hx of anoxic brain injury. 2. Patient is admitted to receive collaborative, interdisciplinary care between the physiatrist, rehab nursing staff, and therapy team. 3. Patient's level of medical complexity and substantial therapy needs in context of that medical necessity cannot be provided at a lesser intensity of care such as a SNF. 4. Patient has experienced substantial functional loss from his/her baseline which was documented above under the "Functional History" and "Functional Status" headings. Judging by the patient's diagnosis, physical exam, and functional history, the patient has potential for functional progress which will result in measurable gains while on inpatient rehab. These gains will be of substantial and practical use upon discharge in facilitating mobility and self-care at the household level. 5. Physiatrist will provide 24 hour management of medical needs as well as oversight of the therapy plan/treatment and provide guidance as appropriate regarding the interaction of the two. 6. 24 hour rehab nursing will assist with bladder management, safety, skin/wound care, disease management, medication administration, pain management and patient education and help integrate therapy concepts, techniques,education, etc. 7. PT will assess and treat for: fxnl mobility, strength, NMR, safety, adaptive equipment. Goals are: supervision to min assist. 8. OT will assess and treat for: UES, ADL's, safety, fxnl mobility, adaptive equipment. Goals are: min to mod assist. 9. SLP will assess and treat for: cognition, communication, safety, . Goals are: min assist. 10. Case Management and Social Worker will assess and treat for psychological issues and discharge planning. 11. Team conference will be held  weekly to assess progress toward goals and to determine barriers to discharge. 12. Patient will receive at least 3 hours of therapy per day at least 5 days per week. 13. ELOS: 2 weeks plus Prognosis: good Medical Problem List and Plan:  1. DVT Prophylaxis/Anticoagulation: Mechanical: Sequential compression devices, below knee Bilateral lower extremities  2. Pain Management: tylenol prn.  3. Mood: will require ego support and encouragement for participation. Patient lacks insight into her deficits.  4. Neuropsych: This patient is not capable of making decisions on his/her own behalf.  5. ESRD: Continue HD M-W-F. Daily weights. Strict I/O.  6. Anorexia: Continue megace bid. Offer ensure supplements bid to help with nutritional support. Continue low salt diet. No FR noted.  7. Seizures: Continue keppra bid

## 2011-12-09 NOTE — PMR Pre-admission (Signed)
PMR Admission Coordinator Pre-Admission Assessment  Patient: Heather Sims is an 21 y.o., female MRN: [id number] DOB: [birth year] Height: 5\' 2"  (157.5 cm) Weight: 49.3 kg (108 lb 11 oz)  Insurance Information PRIMARY:Medicaid  Policy#: [id number]      Subscriber: self Eff. Date: Verified 12/08/11     Emergency Contact Information Contact Information    Name Relation Home Work Mobile   Thompson,Trina Mother [phone]       Current Medical History  Patient Admitting Diagnosis: Hx of anoxic encephalopathy, metabolic encephalopathy,severe deconditioning related to multiple medical   History of Present Illness:21 y.o. female withhistory of C3 nephropathy, anoxic brain injury ( well known to CIR from prior stay), seizure, cardiomyopathy, hypertension with history of somnolence 4-5 days PTA and found to be hypoxic by [facility]. Admitted for workup on 09/05/. Patient's ammonia level was found to be high one week PTA by patient's neurologist and was started on Keppra and the plan was to taper off Depakote. Work up revealed elevated Depakote, chest x-ray shows infiltrates concerning for pneumonia and tense ascites with increased lower extremity fluid. HCAP treated with IV antibiotics. Renal consulted for input and recommended dialysis for acute on chronic renal failure and volume overload. Patient underwent 10 L paracentesis but developed worsening of hypotension with decreased MS and required intubation on 09/07. She developed ABLA due to intra-abdominal bleed with hgb 1.3 with thrombocytopenia and transfused with 8 U pRBC, 4 plts, and 4 FFP. Extubated on 09/10 and CVVH stopped with plans for intermittent HD.   Past Medical History  Past Medical History  Diagnosis Date  . Pneumonia     'walking pneumonia'  . Seizures   . Anemia   . Heart attack   . Cardiomyopathy   . Heart failure   . CHF (congestive heart failure)   . Ileitis   . Chronic kidney disease   . Renal disorder   . MPGN  (membranoproliferative glomerulonephritis), type 2   . Hypertension   . Brain injury    Family History  Family history includes Diabetes in her father and Hypertension in her mother.  Prior Rehab/Hospitalizations: [facility] 04/13/11 - 05/08/11   Current Medications  Current facility-administered medications:0.9 %  sodium chloride infusion, , Intravenous, Continuous, Konstantin Zubelevitskiy, MD, 20 mL at 11/29/11 0830;  0.9 %  sodium chloride infusion, 100 mL, Intravenous, PRN, Irena Cords, MD;  0.9 %  sodium chloride infusion, 100 mL, Intravenous, PRN, Irena Cords, MD;  acetaminophen (TYLENOL) tablet 650 mg, 650 mg, Per Tube, Q6H PRN, Merwyn Katos, MD, 650 mg at 12/09/11 7253 amLODipine (NORVASC) tablet 10 mg, 10 mg, Oral, Daily, Calvert Cantor, MD, 10 mg at 12/08/11 1010;  camphor-menthol (SARNA) lotion, , Topical, PRN, Linward Headland, MD;  carvedilol (COREG) tablet 12.5 mg, 12.5 mg, Oral, BID WC, Saima Rizwan, MD, 12.5 mg at 12/08/11 1725;  diphenhydrAMINE (BENADRYL) injection 25 mg, 25 mg, Intravenous, Q6H PRN, Lonia Farber, MD, 25 mg at 12/01/11 0331 heparin injection 1,000 Units, 1,000 Units, Dialysis, PRN, Irena Cords, MD, 1,000 Units at 12/07/11 1449;  heparin injection 1,000 Units, 20 Units/kg, Dialysis, PRN, Cecille Aver, MD;  hydrALAZINE (APRESOLINE) injection 10-40 mg, 10-40 mg, Intravenous, Q4H PRN, Merwyn Katos, MD, 10 mg at 12/07/11 0414;  hydrOXYzine (ATARAX/VISTARIL) tablet 25 mg, 25 mg, Oral, TID PRN, Linward Headland, MD levETIRAcetam (KEPPRA) tablet 250 mg, 250 mg, Oral, BID, Linward Headland, MD, 250 mg at 12/08/11 2207;  lidocaine (XYLOCAINE) 1 % injection 5 mL,  5 mL, Intradermal, PRN, Irena Cords, MD;  lidocaine-prilocaine (EMLA) cream 1 application, 1 application, Topical, PRN, Irena Cords, MD;  lisinopril (PRINIVIL,ZESTRIL) tablet 20 mg, 20 mg, Oral, Daily, Cecille Aver, MD megestrol (MEGACE) 40 MG/ML suspension 400 mg,  400 mg, Oral, BID, Calvert Cantor, MD, 400 mg at 12/08/11 2207;  ondansetron (ZOFRAN) 4 MG/2ML injection, , , , , 4 mg at 12/09/11 1043;  ondansetron (ZOFRAN) injection 4 mg, 4 mg, Intravenous, Q6H PRN, Eduard Clos, MD;  ondansetron (ZOFRAN) tablet 4 mg, 4 mg, Oral, Q6H PRN, Eduard Clos, MD pentafluoroprop-tetrafluoroeth (GEBAUERS) aerosol 1 application, 1 application, Topical, PRN, Irena Cords, MD;  sodium chloride 0.9 % injection 10 mL, 10 mL, Intravenous, PRN, Domenick Roma, MD;  sodium phosphate 30 mmol in dextrose 5 % 250 mL infusion, 30 mmol, Intravenous, Once, Cecille Aver, MD, 30 mmol at 12/08/11 1508 DISCONTD: cefUROXime (ZINACEF) 1.5 g in dextrose 5 % 50 mL IVPB, 1.5 g, Intravenous, On Call to OR, Dara Lords, PA  Patients Current Diet: General  Precautions / Restrictions Precautions Precautions: Fall Precaution Comments: rectocele Restrictions Weight Bearing Restrictions: No   Prior Activity Level Limited Community (1-2x/wk): 1-2 x a week Home Assistive Devices / Equipment Home Assistive Devices/Equipment: Systems developer (specify type);Shower chair with back Home Adaptive Equipment: Bedside commode/3-in-1;Tub transfer bench;Walker - rolling;Wheelchair - manual  Prior Functional Level Prior Function Level of Independence: Needs assistance Needs Assistance: Bathing;Dressing;Toileting;Meal Prep;Light Housekeeping;Gait Bath: Minimal Dressing: Minimal Toileting: Minimal Meal Prep: Total Light Housekeeping: Total Gait Assistance: with RW needs S around home.   Able to Take Stairs?: No Driving: No Vocation: On disability  Current Functional Level Cognition  Arousal/Alertness: Awake/alert Overall Cognitive Status: Impaired Current Attention Level: Sustained Orientation Level: Oriented to person;Oriented to place;Oriented to time Following Commands: Follows one step commands consistently Safety/Judgement: Decreased awareness of need for  assistance    Extremity Assessment (includes Sensation/Coordination)  RUE ROM/Strength/Tone: Deficits RUE ROM/Strength/Tone Deficits: Shoulder 3+; elbow and wrist 4-; grip 3+ RUE Coordination: Deficits RUE Coordination Deficits: slow, purposeful movements with poor motor control  RLE ROM/Strength/Tone: Deficits RLE ROM/Strength/Tone Deficits: pt with increased tone and tremors.  AAROM WFL and strength grossly 3+/5    ADLs  Grooming: Performed;Teeth care;Wash/dry face;Minimal assistance Where Assessed - Grooming: Supported sitting Lower Body Bathing: Simulated;+2 Total assistance Lower Body Bathing: Patient Percentage: 30% Where Assessed - Lower Body Bathing: Supported sit to stand Upper Body Dressing: Performed;Moderate assistance Where Assessed - Upper Body Dressing: Supported sitting Lower Body Dressing: Performed;+2 Total assistance Lower Body Dressing: Patient Percentage: 20% Where Assessed - Lower Body Dressing: Supported sit to stand Toilet Transfer: Simulated;+2 Total assistance Toilet Transfer: Patient Percentage: 30% Statistician Method: Surveyor, minerals:  (bed to chair) Toileting - Architect and Hygiene: Simulated;+2 Total assistance Toileting - Clothing Manipulation and Hygiene: Patient Percentage: 20% Equipment Used: Gait belt Transfers/Ambulation Related to ADLs: need for bil knee blocking.  ADL Comments: Pt up in chair upon OTs entry.  Alert and willing to work with OT even though she had had PT earlier today.  Issued red foam build ups for eating utensil and toothbrush as pt tends to drop items frequently.  Also worked on sitting balance with feet supported while engage in reaching activities and self care. Pt tearful early in session and by end smiling and teasing OT.      Mobility  Bed Mobility: Rolling Left;Left Sidelying to Sit;Sitting - Scoot to Edge of Bed Rolling Right: Not tested (  comment) Rolling Left: 4: Min guard;With  rail Right Sidelying to Sit: Not tested (comment) Left Sidelying to Sit: 4: Min guard;HOB elevated;With rails Sitting - Scoot to Edge of Bed: 4: Min guard;With rail Sit to Supine: Not Tested (comment) Sit to Supine: Patient Percentage: 60%    Transfers  Transfers: Sit to Stand;Stand to Sit Sit to Stand: 1: +2 Total assist;From bed;From chair/3-in-1 Sit to Stand: Patient Percentage: 30% Stand to Sit: 1: +2 Total assist;To chair/3-in-1 Stand to Sit: Patient Percentage: 20% Stand Pivot Transfers: 2: Max Multimedia programmer Transfers: Patient Percentage: 30% Squat Pivot Transfers: Not tested (comment)    Ambulation / Gait / Stairs / Wheelchair Mobility  Ambulation/Gait Ambulation/Gait Assistance: 1: +2 Total assist (3rd person present for safety) Ambulation/Gait: Patient Percentage:  (40% declining to 20% with incr time upright on feet) Ambulation Distance (Feet): 3 Feet Assistive device: 2 person hand held assist (3rd person to push chair behind) Ambulation/Gait Assistance Details: Limited by anxiety, weakness; Initially accepting some, though minimal weight onto bil LEs, but with inability to fully extend right and Left knees; stance knee flexion increased with more time on feet; Able to take about 2 steps at a time before needing to sit Gait Pattern: Right flexed knee in stance;Left flexed knee in stance;Shuffle Stairs: No Wheelchair Mobility Wheelchair Mobility: No    Posture / Balance Static Sitting Balance Static Sitting - Balance Support: Feet supported;Left upper extremity supported;Right upper extremity supported Static Sitting - Level of Assistance: 5: Stand by assistance Static Sitting - Comment/# of Minutes: 5 minute EOB and Edge of chair; Worked on forward lean (to help with transfers); Pt leaned forward towards mother sitting in front of her (and gave hugs) Dynamic Sitting Balance Dynamic Sitting - Balance Support: Feet supported;During functional activity Dynamic Sitting  - Level of Assistance: 5: Stand by assistance Reach (Patient is able to reach ___ inches to right, left, forward, back): able to reach 4-5 inches all directions with easier time reaching with right UE all directions  Dynamic Sitting - Balance Activities: Lateral lean/weight shifting;Forward lean/weight shifting;Reaching for objects Dynamic Sitting - Comments: Overall sat a total of 8 minutes at EOB performing reaching activities and some LE exercise.     Previous Home Environment Living Arrangements: Parent Lives With: Family (mother) Available Help at Discharge: Family;Available 24 hours/day Type of Home: House Home Layout: One level Home Access: Stairs to enter [employer] of Steps: 2 Bathroom Shower/Tub: Forensic scientist: Standard Home Care Services: Yes Type of Home Care Services: Home RN Home Care Agency (if known): [place]  Discharge Living Setting Plans for Discharge Living Setting: Lives with (comment) (mother) Type of Home at Discharge: House Discharge Home Layout: One level Discharge Home Access: Stairs to enter Entrance Stairs-Number of Steps: 2 Do you have any problems obtaining your medications?: No  Social/Family/Support Systems Patient Roles: Daughter Contact Information: Mother Anticipated Caregiver: Mother: Rogelia Boga Anticipated Caregiver's Contact Information: Rogelia Boga: [phone] Ability/Limitations of Caregiver: none Caregiver Availability: 24/7 Discharge Plan Discussed with Primary Caregiver: Yes Is Caregiver In Agreement with Plan?: Yes Does Caregiver/Family have Issues with Lodging/Transportation while Pt is in Rehab?: No  Goals/Additional Needs Patient/Family Goal for Rehab: PT: min-mod A, OT: S-min A, ST: S-min A Expected length of stay: 3 weeks Cultural Considerations: none Dietary Needs: Dys III Equipment Needs: TBD Special Service Needs: H Dialysis Pt/Family Agrees to Admission and willing to  participate: Yes Program Orientation Provided & Reviewed with Pt/Caregiver Including Roles  & Responsibilities: Yes  Patient Condition: Please see physician update to information in consult dated 12/03/11.  Preadmission Screen Completed By:  [name] Dare, 12/09/2011 2:04 PM ______________________________________________________________________   Discussed status with Dr.Swartz on at 12/09/11 and received telephone approval for admission today.  Admission Coordinator:  [name] Dare, time2:12 PM/Date9/18/13

## 2011-12-09 NOTE — Progress Notes (Addendum)
Talked with pt's PT, Holly re: pt's participation and tolerance with therapy; she reports pt's endurance improving and pt has been participating. She continues to recommend CIR. Pt's mother has refused to allow AV graft placement prior to pt coming to CIR, concerned that the surgery will impede pt's progress in therapy. Talked with Dr Kathrene Bongo who reports that graft can be placed at end of CIR stay. Dr Riley Kill in agreement with plan to admit pt to CIR today. Await order from pt's attending. Please call with questions: [phone].

## 2011-12-09 NOTE — Progress Notes (Signed)
Physical Therapy Cancellation  Attempted to see pt earlier today, however she was in Hemodialysis.   12/09/11 1500  PT Visit Information  Last PT Received On 12/09/11  Reason Eval/Treat Not Completed Patient at procedure or test/unavailable (Hemodialysis)    Van Clines, [place] [phone]

## 2011-12-09 NOTE — Progress Notes (Signed)
Patient's Mom refused to sign surgical consent for avgraft/avfistula placement,"it ain't gonna happen",pt to have rehab and Mom concerned that patient won't be able to maximixe use of hand/arm.Paged Dr. Ander Gaster call back.OR notified of unsigned consent. Auren Valdes Joselita,RN

## 2011-12-09 NOTE — Progress Notes (Signed)
[facility]  Pt's mother would like to cancel today's procedure to allow her to complete rehab. first.  I discussed the rationale for the access placement and the risk and complications of the dialysis catheter.  The family and patient will contact us once they are ready to proceed with the access.   Leonides Sake, MD [facility] [place] Office: [phone] Pager: [phone]  12/09/2011, 7:59 AM

## 2011-12-10 ENCOUNTER — Inpatient Hospital Stay (HOSPITAL_COMMUNITY): Payer: Medicaid Other | Admitting: *Deleted

## 2011-12-10 ENCOUNTER — Inpatient Hospital Stay (HOSPITAL_COMMUNITY): Payer: Medicaid Other | Admitting: Physical Therapy

## 2011-12-10 ENCOUNTER — Inpatient Hospital Stay (HOSPITAL_COMMUNITY): Payer: Medicaid Other | Admitting: Speech Pathology

## 2011-12-10 ENCOUNTER — Inpatient Hospital Stay (HOSPITAL_COMMUNITY): Payer: Medicaid Other | Admitting: Occupational Therapy

## 2011-12-10 DIAGNOSIS — R5381 Other malaise: Secondary | ICD-10-CM

## 2011-12-10 DIAGNOSIS — N186 End stage renal disease: Secondary | ICD-10-CM

## 2011-12-10 DIAGNOSIS — G931 Anoxic brain damage, not elsewhere classified: Secondary | ICD-10-CM

## 2011-12-10 DIAGNOSIS — Z5189 Encounter for other specified aftercare: Secondary | ICD-10-CM

## 2011-12-10 MED ORDER — HEPARIN SODIUM (PORCINE) 1000 UNIT/ML DIALYSIS
20.0000 [IU]/kg | INTRAMUSCULAR | Status: DC | PRN
  Administered 2011-12-14: 900 [IU] via INTRAVENOUS_CENTRAL
  Filled 2011-12-10: qty 1

## 2011-12-10 NOTE — Progress Notes (Signed)
Physical Therapy Session Note  Patient Details  Name: ROBY SPALLA MRN: [id number] Date of Birth: 1990/11/01  Today's Date: 12/10/2011 Time: 4098-1191 Time Calculation (min): 35 min   Skilled Therapeutic Interventions/Progress Updates:   See below for details   Therapy Documentation Precautions:  Precautions Precautions: Fall Restrictions Weight Bearing Restrictions: No Pain: Pain Assessment Pain Assessment: No/denies pain Mobility: Transfer training, recliner to w/c, w/c to NuStep, w/c to bed, overall with total @+1.  Unless pt is specifically cued she stand up and immediately sit back down when performing a transfer, resulting in possible injury to pt and staff = total @.  If cued to stay up she requires mod@. Balance: Standing balance while playing checkers with mod@, pt never able to fully extend her hips and knees, performed x 3 min x 2.  Pt making up her own rules while playing the game. Exercises: NuStep x 5 min for UE and LE strengthening.  Pt needing instructional cues to push through the full ROM.   See FIM for current functional status  Therapy/Group: Individual Therapy  Georges Mouse 12/10/2011, 1:40 PM

## 2011-12-10 NOTE — Evaluation (Signed)
Speech Language Pathology Assessment and Plan  Patient Details  Name: Heather Sims MRN: [id number] Date of Birth: [birth year]  SLP Diagnosis: Cognitive Impairments  Rehab Potential: Good ELOS: 2 weeks   Today's Date: 12/10/2011 Time: 1515-1600 Time Calculation (min): 45 min  Skilled Therapeutic Intervention: Administered cognitive-lingusitic evaluation. Please see below for details.   Problem List:  Patient Active Problem List  Diagnosis  . Acute heart failure  . Hypoxemia  . Pneumonia  . Anemia due to blood loss, acute  . Hypertension  . ? Viral Cardiomyopathy  . Seizures disorder  . Anoxic brain damage  . Myoclonus  . Ileitis  . CKD (chronic kidney disease)- new chronic HD start this admit  . Hypoalbuminemia  . MPGN (membranoproliferative glomerulonephritis), type 2  . Acute renal failure  . Dehydration  . C. difficile colitis  . Seizure disorder, grand mal  . Encephalopathy acute  . Renal failure (ARF), acute on chronic  . Cardiomyopathy  . Acute respiratory failure  . Pulmonary edema  . Hemorrhagic shock, due to intraabdominal bleeding following paracentesis  . Hypervolemia  . Ascites   Past Medical History:  Past Medical History  Diagnosis Date  . Pneumonia     'walking pneumonia'  . Seizures   . Anemia   . Heart attack   . Cardiomyopathy   . Heart failure   . CHF (congestive heart failure)   . Ileitis   . Chronic kidney disease   . Renal disorder   . MPGN (membranoproliferative glomerulonephritis), type 2   . Hypertension   . Brain injury    Past Surgical History:  Past Surgical History  Procedure Date  . Renal biopsy     Assessment / Plan / Recommendation Clinical Impression  Patient is a 21 y.o. year old female with history of C3 nephropathy, anoxic brain injury ( well known to CIR from prior stay), seizure, cardiomyopathy, hypertension with history of somnolence 4-5 days PTA and found to be hypoxic by [facility]. Admitted for workup on  09/05/. Patient's ammonia level was found to be high one week PTA by patient's neurologist and was started on Keppra and the plan was to taper off Depakote. Work up revealed elevated Depakote, chest x-ray shows infiltrates concerning for pneumonia and tense ascites with increased lower extremity fluid. HCAP treated with IV antibiotics. Renal consulted for input and recommended dialysis for acute on chronic renal failure and volume overload. Patient underwent with tense ascites and underwent 10 Liters paracentesis but developed worsening of hypotension with decreased MS and required intubation on 09/07. She developed ABLA due to intra-abdominal bleed with hgb 1.3 with thrombocytopenia and transfused with 8 U pRBC, 4 plts, and 4 FFP. Extubated on 09/10. Required CVVH and tunneled HD cath placed due to HD needs. Creatinine on downward trend but requiring HD MWF. Family has refuse dialysis cath for now--plans for placement in the future once family ready to proceed and needs encouragement for po Intake. Patient noted to be deconditioned with continued cognitive deficits. Patient transferred to CIR on 12/09/2011 and presents with moderate cognitive impairments characterized by decreased awareness, decreased problem solving, decreased safety awareness, decreased memory, decreased initiation and delayed processing. Patient will benefit from skilled intervention to maximize cognitive function and increase independence prior to discharge home with care partner. Anticipate patient will require 24 hour supervision and minimal cognitive assistance and follow up home health.    SLP Assessment  Patient will need skilled Speech Lanaguage Pathology Services during CIR admission  Recommendations  Follow up Recommendations: 24 hour supervision/assistance;Home Health SLP Equipment Recommended: None recommended by SLP    SLP Frequency 1-2 X/day, 30-60 minutes   SLP Treatment/Interventions Cognitive  remediation/compensation;Cueing hierarchy;Functional tasks;Environmental controls;Internal/external aids;Patient/family education;Therapeutic Activities    Pain Pain Assessment Pain Assessment: No/denies pain Prior Functioning Type of Home: House Lives With: Family Available Help at Discharge: Family;Available 24 hours/day  Short Term Goals: Week 1: SLP Short Term Goal 1 (Week 1): Pt will express wants/needs with Mod verbal and question cues.  SLP Short Term Goal 2 (Week 1): pt will demonstrate intellectual awareness and identify 1 physical and 1 cognitive change with Mod semantic cues.  SLP Short Term Goal 3 (Week 1): Pt will utilize external memory aids to recall new, daily information with Mod visual and question cues.  SLP Short Term Goal 4 (Week 1): Pt will demonstrate functional problem solving for functional and familiar tasks with Mod verbal cues.   See FIM for current functional status Refer to Care Plan for Long Term Goals  Recommendations for other services: None  Discharge Criteria: Patient will be discharged from SLP if patient refuses treatment 3 consecutive times without medical reason, if treatment goals not met, if there is a change in medical status, if patient makes no progress towards goals or if patient is discharged from hospital.  The above assessment, treatment plan, treatment alternatives and goals were discussed and mutually agreed upon: by patient and by family  Bentlee Drier 12/10/2011, 5:38 PM

## 2011-12-10 NOTE — Evaluation (Signed)
Occupational Therapy Assessment and Plan  Patient Details  Name: Heather Sims MRN: [id number] Date of Birth: [birth year]  OT Diagnosis: cognitive deficits and muscle weakness (generalized) Rehab Potential: Rehab Potential: Good ELOS: 2-3 weeks   Today's Date: 12/10/2011 Time: 1030-1130 Time Calculation (min): 60 min  Problem List:  Patient Active Problem List  Diagnosis  . Acute heart failure  . Hypoxemia  . Pneumonia  . Anemia due to blood loss, acute  . Hypertension  . ? Viral Cardiomyopathy  . Seizures disorder  . Anoxic brain damage  . Myoclonus  . Ileitis  . CKD (chronic kidney disease)- new chronic HD start this admit  . Hypoalbuminemia  . MPGN (membranoproliferative glomerulonephritis), type 2  . Acute renal failure  . Dehydration  . C. difficile colitis  . Seizure disorder, grand mal  . Encephalopathy acute  . Renal failure (ARF), acute on chronic  . Cardiomyopathy  . Acute respiratory failure  . Pulmonary edema  . Hemorrhagic shock, due to intraabdominal bleeding following paracentesis  . Hypervolemia  . Ascites    Past Medical History:  Past Medical History  Diagnosis Date  . Pneumonia     'walking pneumonia'  . Seizures   . Anemia   . Heart attack   . Cardiomyopathy   . Heart failure   . CHF (congestive heart failure)   . Ileitis   . Chronic kidney disease   . Renal disorder   . MPGN (membranoproliferative glomerulonephritis), type 2   . Hypertension   . Brain injury    Past Surgical History:  Past Surgical History  Procedure Date  . Renal biopsy     Assessment & Plan Clinical Impression: Patient is a 21 y.o. year old female with history of C3 nephropathy, anoxic brain injury ( well known to CIR from prior stay), seizure, cardiomyopathy, hypertension with history of somnolence 4-5 days PTA and found to be hypoxic by [facility]. Admitted for workup on 09/05/. Patient's ammonia level was found to be high one week PTA by patient's neurologist  and was started on Keppra and the plan was to taper off Depakote. Work up revealed elevated Depakote, chest x-ray shows infiltrates concerning for pneumonia and tense ascites with increased lower extremity fluid. HCAP treated with IV antibiotics. Renal consulted for input and recommended dialysis for acute on chronic renal failure and volume overload. Patient underwent with tense ascites and underwent 10 Liters paracentesis but developed worsening of hypotension with decreased MS and required intubation on 09/07. She developed ABLA due to intra-abdominal bleed with hgb 1.3 with thrombocytopenia and transfused with 8 U pRBC, 4 plts, and 4 FFP. Extubated on 09/10. Required CVVH and tunneled HD cath placed due to HD needs. Creatinine on downward trend but requiring HD MWF. Family has refuse dialysis cath for now--plans for placement in the future once family ready to proceed. needs encouragement for po Intake. Patient noted to be deconditioned with continued cognitive deficits.   Patient transferred to CIR on 12/09/2011 .    Patient currently requires mod with basic self-care skills and and basic mobility secondary to muscle weakness, decreased cardiorespiratoy endurance, abnormal tone, unbalanced muscle activation and myoclonic tone, decreased awareness, decreased problem solving, decreased safety awareness, decreased memory and delayed processing and decreased sitting balance, decreased standing balance, decreased postural control and decreased balance strategies.  Prior to hospitalization, patient could complete ADLs with supervision.  Patient will benefit from skilled intervention to decrease level of assist with basic self-care skills and increase independence with basic  self-care skills prior to discharge home with care partner.  Anticipate patient will require 24 hour supervision and minimal physical assistance and follow up home health.  OT - End of Session Activity Tolerance: Tolerates 10 - 20 min  activity with multiple rests OT Assessment Rehab Potential: Good OT Plan OT Frequency: 1-2 X/day, 60-90 minutes Estimated Length of Stay: 2-3 weeks OT Treatment/Interventions: Balance/vestibular training;Community reintegration;Discharge planning;Cognitive remediation/compensation;DME/adaptive equipment instruction;Functional mobility training;Disease mangement/prevention;Neuromuscular re-education;Psychosocial support;Therapeutic Exercise;UE/LE Coordination activities;UE/LE Strength taining/ROM;Splinting/orthotics;Therapeutic Activities;Self Care/advanced ADL retraining;Patient/family education;Wheelchair propulsion/positioning OT Recommendation Follow Up Recommendations: Home health OT  OT Evaluation Precautions/Restrictions  Precautions Precautions: Fall Restrictions Weight Bearing Restrictions: No General Chart Reviewed: Yes Family/Caregiver Present: Yes Vital Signs   Pain Pain Assessment Pain Assessment: No/denies pain Home Living/Prior Functioning Home Living Lives With: Family Available Help at Discharge: Family;Available 24 hours/day Type of Home: House Home Access: Stairs to enter [employer] of Steps: 2 Home Layout: One level Bathroom Shower/Tub: Engineer, manufacturing systems: Standard ADL   Vision/Perception  Vision - History Baseline Vision: No visual deficits  Cognition Overall Cognitive Status: Impaired at baseline Arousal/Alertness: Awake/alert Orientation Level: Oriented to person;Oriented to place;Disoriented to situation Attention: Focused;Sustained Focused Attention: Appears intact Sustained Attention: Appears intact Memory: Impaired Memory Impairment: Storage deficit;Retrieval deficit;Decreased recall of new information;Decreased long term memory;Decreased short term memory Decreased Long Term Memory: Verbal basic;Functional basic Decreased Short Term Memory: Verbal basic;Functional basic Awareness: Impaired Awareness Impairment:  Intellectual impairment Problem Solving: Impaired Problem Solving Impairment: Verbal basic;Functional basic Executive Function: Reasoning;Sequencing;Organizing;Decision Making;Initiating Reasoning: Impaired Reasoning Impairment: Verbal basic;Functional basic Sequencing: Impaired Sequencing Impairment: Verbal basic;Functional basic Organizing: Impaired Organizing Impairment: Verbal basic;Functional basic Decision Making: Impaired Decision Making Impairment: Verbal basic;Functional basic Safety/Judgment: Impaired Sensation Sensation Light Touch: Impaired Detail Light Touch Impaired Details: Impaired LUE;Impaired RUE;Impaired RLE;Impaired LLE Stereognosis: Impaired by gross assessment Proprioception: Impaired Detail Proprioception Impaired Details: Impaired RLE;Impaired LLE Coordination Gross Motor Movements are Fluid and Coordinated: No Fine Motor Movements are Fluid and Coordinated: No Motor  Motor Motor: Ataxia;Motor apraxia;Abnormal tone;Abnormal postural alignment and control Motor - Skilled Clinical Observations: myoclonic jerks Mobility  Bed Mobility Sitting - Scoot to Edge of Bed: 4: Min assist Transfers Sit to Stand: 3: Mod assist Stand to Sit: 3: Mod assist  Trunk/Postural Assessment  Cervical Assessment Cervical Assessment: Within Functional Limits Thoracic Assessment Thoracic Assessment: Within Functional Limits Lumbar Assessment Lumbar Assessment: Exceptions to [facility] (posterior pelvic tilt) Postural Control Postural Control: Deficits on evaluation (decreased control)  Balance Balance Balance Assessed: Yes Static Sitting Balance Static Sitting - Balance Support: Bilateral upper extremity supported Static Sitting - Level of Assistance: 4: Min assist Dynamic Sitting Balance Dynamic Sitting - Balance Support: No upper extremity supported Dynamic Sitting - Level of Assistance: 5: Stand by assistance Static Standing Balance Static Standing - Balance Support:  During functional activity Static Standing - Level of Assistance: 3: Mod assist Extremity/Trunk Assessment RUE Assessment RUE Assessment: Within Functional Limits LUE Assessment LUE Assessment: Within Functional Limits  See FIM for current functional status Refer to Care Plan for Long Term Goals  Recommendations for other services: None  Discharge Criteria: Patient will be discharged from OT if patient refuses treatment 3 consecutive times without medical reason, if treatment goals not met, if there is a change in medical status, if patient makes no progress towards goals or if patient is discharged from hospital.  The above assessment, treatment plan, treatment alternatives and goals were discussed and mutually agreed upon: by patient and by family  1:1 OT eval with mother  present in session. Goal, purpose and role of OT discussed. This pt is known to this OT from last two admissions. Self care retraining at sink level including bathing, dressing and grooming, focusing on sit to stands, standing balance, making wants and needs known, working memory, functional problem solving.   Roney Mans [facility] 12/10/2011, 12:51 PM

## 2011-12-10 NOTE — Progress Notes (Signed)
Patient's BP elevated this morning, 124/104 hr 94. Dr. Riley Kill notfied. Norvasc and lisinopril to be given this a.m.

## 2011-12-10 NOTE — Evaluation (Signed)
Physical Therapy Assessment and Plan  Patient Details  Name: Heather Sims MRN: [id number] Date of Birth: [birth year]  PT Diagnosis: Abnormality of gait, Ataxic gait, Cognitive deficits, Coordination disorder, Difficulty walking, Impaired sensation and Muscle weakness Rehab Potential: Good ELOS: 2 weeks   Today's Date: 12/10/2011 Time: 800-855 55 minutes  Problem List:  Patient Active Problem List  Diagnosis  . Acute heart failure  . Hypoxemia  . Pneumonia  . Anemia due to blood loss, acute  . Hypertension  . ? Viral Cardiomyopathy  . Seizures disorder  . Anoxic brain damage  . Myoclonus  . Ileitis  . CKD (chronic kidney disease)- new chronic HD start this admit  . Hypoalbuminemia  . MPGN (membranoproliferative glomerulonephritis), type 2  . Acute renal failure  . Dehydration  . C. difficile colitis  . Seizure disorder, grand mal  . Encephalopathy acute  . Renal failure (ARF), acute on chronic  . Cardiomyopathy  . Acute respiratory failure  . Pulmonary edema  . Hemorrhagic shock, due to intraabdominal bleeding following paracentesis  . Hypervolemia  . Ascites    Past Medical History:  Past Medical History  Diagnosis Date  . Pneumonia     'walking pneumonia'  . Seizures   . Anemia   . Heart attack   . Cardiomyopathy   . Heart failure   . CHF (congestive heart failure)   . Ileitis   . Chronic kidney disease   . Renal disorder   . MPGN (membranoproliferative glomerulonephritis), type 2   . Hypertension   . Brain injury    Past Surgical History:  Past Surgical History  Procedure Date  . Renal biopsy     Assessment & Plan Clinical Impression: Patient is a 21 y.o. year old female with recent admission to the hospital with history of somnolence 22-5 days PTA and found to be hypoxic by [facility]. Admitted for workup on 09/05/. Patient's ammonia level was found to be high one week PTA by patient's neurologist and was started on Keppra and the plan was to taper  off Depakote. Work up revealed elevated Depakote, chest x-ray shows infiltrates concerning for pneumonia and tense ascites with increased lower extremity fluid. HCAP treated with IV antibiotics. Renal consulted for input and recommended dialysis for acute on chronic renal failure and volume overload. Patient underwent with tense ascites and underwent 10 Liters paracentesis but developed worsening of hypotension with decreased MS and required intubation on 09/07. She developed ABLA due to intra-abdominal bleed with hgb 1.3 with thrombocytopenia and transfused with 8 U pRBC, 4 plts, and 4 FFP. Extubated on 09/10. Required CVVH and tunneled HD cath placed due to HD needs. Creatinine on downward trend but requiring HD MWF. Patient transferred to CIR on 12/09/2011 .   Patient currently requires max with mobility secondary to muscle weakness, impaired timing and sequencing, abnormal tone, unbalanced muscle activation, motor apraxia, ataxia, decreased coordination and decreased motor planning, decreased awareness, decreased problem solving, decreased safety awareness and delayed processing and decreased sitting balance, decreased standing balance, decreased postural control and decreased balance strategies.  Prior to hospitalization, patient was supervision with mobility and lived with Family in a House home.  Home access is 2Stairs to enter.  Patient will benefit from skilled PT intervention to maximize safe functional mobility, minimize fall risk and decrease caregiver burden for planned discharge home with 24 hour assist.  Anticipate patient will benefit from follow up [facility] at discharge.  PT - End of Session Activity Tolerance: Tolerates 30+ min  activity with multiple rests PT Assessment Rehab Potential: Good PT Plan PT Frequency: 1-2 X/day, 60-90 minutes Estimated Length of Stay: 2 weeks PT Treatment/Interventions: Ambulation/gait training;Balance/vestibular training;Cognitive  remediation/compensation;Discharge planning;Community reintegration;DME/adaptive equipment instruction;Neuromuscular re-education;Therapeutic Exercise;UE/LE Strength taining/ROM;Splinting/orthotics;Pain management;Functional electrical stimulation;Patient/family education;Stair training;Therapeutic Activities;Functional mobility training;UE/LE Coordination activities;Wheelchair propulsion/positioning  PT Evaluation Precautions/Restrictions Precautions Precautions: Fall Restrictions Weight Bearing Restrictions: No Pain Pain Assessment Pain Assessment: No/denies pain Home Living/Prior Functioning Home Living Lives With: Family Available Help at Discharge: Family;Available 24 hours/day Type of Home: House Home Access: Stairs to enter [employer] of Steps: 2 Home Layout: One level Home Adaptive Equipment: Wheelchair - manual;Walker - rolling Prior Function Level of Independence: Needs assistance with ADLs;Needs assistance with gait;Needs assistance with tranfers (supervision-min A) Able to Take Stairs?: No Driving: No  Cognition Overall Cognitive Status: Impaired Attention: Selective Selective Attention: Appears intact Awareness: Impaired Awareness Impairment: Intellectual impairment Safety/Judgment: Impaired Sensation Sensation Light Touch: Impaired Detail Light Touch Impaired Details: Impaired RUE;Impaired LUE;Impaired RLE;Impaired LLE Proprioception: Impaired Detail Proprioception Impaired Details: Impaired RLE;Impaired LLE Coordination Gross Motor Movements are Fluid and Coordinated: No Coordination and Movement Description: myoclonic movements and tone, ataxic movements Motor  Motor Motor: Ataxia;Motor apraxia;Abnormal tone;Abnormal postural alignment and control  Mobility Bed Mobility Supine to Sit: 4: Min assist Supine to Sit Details (indicate cue type and reason): cues for sequencing, assist for wt shift Transfers Sit to Stand: 2: Max assist Sit to  Stand Details (indicate cue type and reason): pt requires cues for forward wt shift and hand placement, lifting assistance Stand Pivot Transfers: 2: Max assist Stand Pivot Transfer Details (indicate cue type and reason): cues for wt shifts and sequencing Squat Pivot Transfers: 3: Mod assist Squat Pivot Transfer Details (indicate cue type and reason): cues for UE and LE placement, assist for wt shifting Locomotion  Ambulation Ambulation: Yes Ambulation/Gait Assistance: 1: +2 Total assist (2nd person for safety) Ambulation Distance (Feet): 15 Feet Assistive device: Rolling walker Ambulation/Gait Assistance Details: pt unable to fully extend knees, requires total A for forward wt shifting and RW control, able to advance LEs without assist.  myclonic tremors throughout gait training Stairs / Additional Locomotion Stairs: No Wheelchair Mobility Wheelchair Mobility: Yes Wheelchair Assistance: 5: Investment banker, operational: Both upper extremities Wheelchair Parts Management: Needs assistance  Trunk/Postural Assessment  Cervical Assessment Cervical Assessment: Within Film/video editor Assessment: Within Functional Limits Lumbar Assessment Lumbar Assessment:  (posterior tilt) Postural Control Postural Control:  (decreased trunk control)  Balance Static Sitting Balance Static Sitting - Level of Assistance: 4: Min assist Dynamic Sitting Balance Dynamic Sitting - Level of Assistance: 4: Min assist Static Standing Balance Static Standing - Level of Assistance: 2: Max assist Dynamic Standing Balance Dynamic Standing - Level of Assistance: 1: +2 Total assist Extremity Assessment      RLE Assessment RLE Assessment:  (-15 degrees knee extension, AROM WFL hip, DF limited to 0) LLE Assessment LLE Assessment:  (-15 degrees knee extension, AROM hip WFL, DF to 0)  See FIM for current functional status Refer to Care Plan for Long Term  Goals  Recommendations for other services: None  Discharge Criteria: Patient will be discharged from PT if patient refuses treatment 3 consecutive times without medical reason, if treatment goals not met, if there is a change in medical status, if patient makes no progress towards goals or if patient is discharged from hospital.  The above assessment, treatment plan, treatment alternatives and goals were discussed and mutually agreed upon: by patient and by family  Treatment initiated during session: Sitting dynamic balance with min A for functional tasks.  Transfer training squat pivot with mod A, SPT with RW with total A.  Pt with difficulty sequencing stepping and RW control.  Standing tolerance and balance training with tactile facilitation for B knee extension, pt unable to fully extend knees despite manual facilitation.  Stretching to B hamstrings with pt unable to tolerate full range due to pain.  Valena Ivanov 12/10/2011, 5:28 PM

## 2011-12-10 NOTE — Progress Notes (Signed)
PA Student Daily Progress Note [facility] Subjective: Pt was admitted to Rehab yesterday.  She says that she feels good today.  She denies any pain or difficulty breathing today.  Pt is alert and actively working with therapy.  Tolerated HD yesterday  Objective:  Filed Vitals:   12/09/11 1815 12/10/11 0500 12/10/11 0645  BP: 114/76 145/105 124/104  Pulse: 93 94   Temp: 98.6 F (37 C) 97.7 F (36.5 C)   TempSrc: Oral Oral   Resp: 20 19   SpO2: 96% 100%    Physical Exam: General:Pt is alert,  bed, no acute distress.  Cardio: tachycardic, S1, S2, systolic murmer loudest at left 2nd ICS  Respiratory:pt breathing normally, without use of accessory muscles. lungs clear to auscultation bilaterally  Abdomen: soft, nontender, still mildly distended but significantly reduced from last week, noted active BS  Extremities: warm, well perfused, no lower extremity edema  Neurologic: Pt oriented to person and place. Pt is less alert this am. Pt will verbalize short phrases and sentences.   Assessment/Plan: 1. New ESRD - last HD 12/09/11. Scheduled for HD tomorrow.  Pt had a pre dialysis weight of 108 lb and a post HD weigh tof 98 lb yesterday- probably error.  Her net UF was 3775 ml.   No urine output recorded. Originally thought to need HD 4 times/week for fluid mgmt, however will try Monday Wednesday, Friday this week. Tunneled HD cath placed on 12/04/11. Vein study in July & plan for AV fistula placement before discharge home so that pt is able to fully participate in rehab.  2. HTN - BP reduced after fluid removal with HD yesterday. Running 114/76 - 148/97.  Pt on amlodipine, carvedilol, lisinopril & HD. Much better than baseline but still over goal. Continue with UF with HD  3. Anoxic Brain Injury following PEA arrest - Stable, chronic. Pt's mother reports pt is much improved but not quite back to baseline yet.  4. Cardiomyopathy. Repeat 2D Echo ordered with no change from baseline.    5. MPGN with C3 nephropathy - Aranesp continued.  6. Seizure disorder - Keppra PO.  7.Anemia- last Hgb 12.9, no ESA needed  8. Bones- last PTH in double digits,no vitamin D needed. Will recheck PTH. Phos 3, no binders needed.  9. Dispo- PT and eventually home with mother who takes good care of her  10. Hypophosphatemia- Megace started yesterday. Phos increased to 3 today. Will monitor phos after HD. Encouraging PO intake. No binder needed  Labs: Basic Metabolic Panel:  Lab 12/09/11 1610 12/08/11 0540 12/07/11 0433  NA 134* 137 135  K 3.8 3.6 3.8  CL 100 102 100  CO2 29 31 27   GLUCOSE 81 85 84  BUN 16 10 24*  CREATININE 2.36* 1.88* 2.43*  CALCIUM 9.0 8.7 8.7  ALB -- -- --  PHOS 3.0 1.6* 2.6   Liver Function Tests:  Lab 12/09/11 0845 12/08/11 0540 12/07/11 0433  AST -- -- --  ALT -- -- --  ALKPHOS -- -- --  BILITOT -- -- --  PROT -- -- --  ALBUMIN 1.9* 1.9* 1.7*   CBC:  Lab 12/09/11 0845 12/08/11 0540 12/07/11 0433 12/06/11 0801 12/05/11 0630  WBC 9.2 10.2 11.3* -- --  NEUTROABS -- -- -- -- --  HGB 12.9 12.1 12.3 -- --  HCT 39.7 37.2 37.2 -- --  MCV 91.3 92.1 90.5 90.4 90.4  PLT 219 196 204 -- --   Blood Culture    Component  Value Date/Time   SDES STOOL 12/06/2011 1135   SPECREQUEST NONE 12/06/2011 1135   CULT NO GROWTH 5 DAYS 11/28/2011 2245   REPTSTATUS 12/08/2011 FINAL 12/06/2011 1135   CBG:  Lab 12/05/11 0840 12/04/11 2144 12/04/11 1555 12/04/11 1222 12/04/11 0806  GLUCAP 84 84 82 75 83   Micro Results: Recent Results (from the past 240 hour(s))  CULTURE, EXPECTORATED SPUTUM-ASSESSMENT     Status: Normal   Collection Time   12/03/11  5:33 PM      Component Value Range Status Comment   Specimen Description SPUTUM   Final    Special Requests NONE   Final    Sputum evaluation     Final    Value: MICROSCOPIC FINDINGS SUGGEST THAT THIS SPECIMEN IS NOT REPRESENTATIVE OF LOWER RESPIRATORY SECRETIONS. PLEASE RECOLLECT.     CALLED TO RN K.PORTER AT 2300 BY L.PITT  12/03/11   Report Status 12/04/2011 FINAL   Final   SURGICAL PCR SCREEN     Status: Abnormal   Collection Time   12/09/11  6:45 AM      Component Value Range Status Comment   MRSA, PCR NEGATIVE  NEGATIVE Final    Staphylococcus aureus POSITIVE (*) NEGATIVE Final    Studies/Results: No results found. Medications: Scheduled Meds:   . amLODipine  10 mg Oral Daily  . carvedilol  12.5 mg Oral BID WC  . feeding supplement  237 mL Oral BID BM  . levETIRAcetam  250 mg Oral BID  . lisinopril  20 mg Oral Daily  . megestrol  400 mg Oral BID   Continuous Infusions:  PRN Meds:.alum & mag hydroxide-simeth, bisacodyl, camphor-menthol, diphenhydrAMINE, guaiFENesin-dextromethorphan, heparin, heparin, hydrOXYzine, lidocaine, lidocaine-prilocaine, methocarbamol, ondansetron (ZOFRAN) IV, ondansetron, pentafluoroprop-tetrafluoroeth, polyethylene glycol, prochlorperazine, prochlorperazine, prochlorperazine, sodium phosphate   This is a Psychologist, occupational Note.  The care of the patient was discussed with Dr. Kathrene Bongo and the assessment and plan formulated with their assistance.  Please see their attached note for official documentation of the daily encounter.   Delmer Islam PA-S2 12/10/2011, 10:32 AM  Patient seen and examined, agree with above note with above modifications. 21 year old new start to HD mostly for fluid management.  Tolerating well.  Plan for HD tomorrow.  No ESA or binder needed.   Annie Sable, MD 12/10/2011

## 2011-12-10 NOTE — Progress Notes (Signed)
INITIAL ADULT NUTRITION ASSESSMENT Date: 12/10/2011   Time: 10:52 AM  Reason for Assessment: Health History  INTERVENTION: 1. Continue least restrictive diet to encourage PO intake 2. Recommend renal vitamin while pt is on HD 3. Recommend nutrition support if pt is unable to consistently consume at least 50% of meals/supplements 4. RD to continue to follow nutrition care plan - will provide education at more appropriate time. Pt has been educated on renal restrictions in the past - will likely need reinforcement.  DOCUMENTATION CODES Per approved criteria  -Severe malnutrition in the context of chronic illness -Underweight   ASSESSMENT: Female 21 y.o.  Dx:   Hx:  Past Medical History  Diagnosis Date  . Pneumonia     'walking pneumonia'  . Seizures   . Anemia   . Heart attack   . Cardiomyopathy   . Heart failure   . CHF (congestive heart failure)   . Ileitis   . Chronic kidney disease   . Renal disorder   . MPGN (membranoproliferative glomerulonephritis), type 2   . Hypertension   . Brain injury    Past Surgical History  Procedure Date  . Renal biopsy    Related Meds:     . amLODipine  10 mg Oral Daily  . carvedilol  12.5 mg Oral BID WC  . feeding supplement  237 mL Oral BID BM  . levETIRAcetam  250 mg Oral BID  . lisinopril  20 mg Oral Daily  . megestrol  400 mg Oral BID   Ht:  5\' 2"  (157.5 cm)  Wt:  98 lb (44.7 kg) s/p HD on 9/18  Ideal Wt:    50 kg % Ideal Wt: 89%  Wt Readings from Last 25 Encounters:  12/09/11 98 lb 8.7 oz (44.7 kg)  12/09/11 98 lb 8.7 oz (44.7 kg)  12/09/11 98 lb 8.7 oz (44.7 kg)  11/11/11 120 lb (54.432 kg)  10/16/11 130 lb 8.2 oz (59.2 kg)  09/24/11 140 lb 14.4 oz (63.912 kg)  09/01/11 119 lb (53.978 kg)  08/25/11 117 lb 8.1 oz (53.3 kg)  07/03/11 107 lb (48.535 kg)  07/01/11 107 lb (48.535 kg)  05/27/11 122 lb 5.7 oz (55.5 kg)  05/07/11 127 lb 13.9 oz (58 kg)  04/28/11 125 lb 3.2 oz (56.79 kg)  04/10/11 153 lb 4.8 oz  (69.536 kg)  Usual Wt: 106 - 110 lb % Usual Wt: 91%  BMI is 18 - underweight  Food/Nutrition Related Hx: eating poorly PTA  Labs:  CMP     Component Value Date/Time   NA 134* 12/09/2011 0845   K 3.8 12/09/2011 0845   CL 100 12/09/2011 0845   CO2 29 12/09/2011 0845   GLUCOSE 81 12/09/2011 0845   BUN 16 12/09/2011 0845   CREATININE 2.36* 12/09/2011 0845   CREATININE 2.63* 10/12/2011 1230   CALCIUM 9.0 12/09/2011 0845   CALCIUM 8.4 10/28/2011 1327   PROT 6.1 11/29/2011 0224   ALBUMIN 1.9* 12/09/2011 0845   AST 56* 11/29/2011 0224   ALT 6 12/01/2011 2330   ALKPHOS 62 11/29/2011 0224   BILITOT 0.4 11/29/2011 0224   GFRNONAA 28* 12/09/2011 0845   GFRAA 33* 12/09/2011 0845   Sodium  Date/Time Value Range Status  12/09/2011  8:45 AM 134* 135 - 145 mEq/L Final  12/08/2011  5:40 AM 137  135 - 145 mEq/L Final  12/07/2011  4:33 AM 135  135 - 145 mEq/L Final    Potassium  Date/Time Value Range Status  12/09/2011  8:45 AM 3.8  3.5 - 5.1 mEq/L Final  12/08/2011  5:40 AM 3.6  3.5 - 5.1 mEq/L Final  12/07/2011  4:33 AM 3.8  3.5 - 5.1 mEq/L Final    Phosphorus  Date/Time Value Range Status  12/09/2011  8:45 AM 3.0  2.3 - 4.6 mg/dL Final  4/54/0981  1:91 AM 1.6* 2.3 - 4.6 mg/dL Final  4/78/2956  2:13 AM 2.6  2.3 - 4.6 mg/dL Final    Magnesium  Date/Time Value Range Status  12/01/2011  5:00 AM 2.0  1.5 - 2.5 mg/dL Final  0/10/6576  4:69 AM 1.9  1.5 - 2.5 mg/dL Final  08/23/9526  4:13 AM 1.6  1.5 - 2.5 mg/dL Final    Intake/Output Summary (Last 24 hours) at 12/10/11 1054 Last data filed at 12/09/11 1825  Gross per 24 hour  Intake    240 ml  Output      1 ml  Net    239 ml  BM: 9/18   Diet Order: General  Supplements/Tube Feeding: Ensure Complete PO BID  IVF:    Estimated Nutritional Needs:   Kcal: 1650 - 1900 kcal Protein:  70 - 85 grams protein Fluid:  1.2 liters daily  RD familiar with patient; pt seen by this RD during acute hospitalization. Pt admitted at weight of 160 lb. S/p HD and  paracentesis, pt is now down to 98 lb. Intake was poor during acute hospitalization, pt consuming at most 50% of meals. Intake is now slightly improved, pt consuming 75% of meals. Pt on liberalized diet and no fluid restriction per chart.  Pt meets criteria for severe malnutrition in the context of chronic illness as evidenced by severe fluid accumulation, intake of <75% of estimated energy requirement for at least 1 month, and likely muscle mass loss given inadequate intake/lethargy.  Pt now requiring HD MWF, per MD note - family has refused dialysis cath for now - plans for placement in the future once family ready to proceed.  NUTRITION DIAGNOSIS: Inadequate oral intake r/t decreased attention and lethargy AEB very poor PO intake.  MONITORING/EVALUATION(Goals): Goal: Pt to meet >/= 90% of their estimated nutrition needs Monitor: weight trends, lab trends, I/O's, PO intake, supplement tolerance  EDUCATION NEEDS: -Education not appropriate at this time  Jarold Motto MS, RD, LDN Pager: [phone] After-hours pager: [phone]

## 2011-12-10 NOTE — Progress Notes (Signed)
Patient information reviewed and entered into [facility] system by Numair Masden, RN-BC, CRRN,  covering PPS Coordinator.  Information including coding and functional independence measure will be reviewed and updated through discharge.   

## 2011-12-10 NOTE — Evaluation (Signed)
Recreational Therapy Assessment and Plan  Patient Details  Name: Heather Sims MRN: [id number] Date of Birth: 06/17/1990 Today's Date: 12/10/2011  Rehab Potential: Good ELOS:  2 weeks   Assessment Clinical Impression: Problem List:  Patient Active Problem List   Diagnosis   .  Acute heart failure   .  Hypoxemia   .  Pneumonia   .  Anemia due to blood loss, acute   .  Hypertension   .  ? Viral Cardiomyopathy   .  Seizures disorder   .  Anoxic brain damage   .  Myoclonus   .  Ileitis   .  CKD (chronic kidney disease)- new chronic HD start this admit   .  Hypoalbuminemia   .  MPGN (membranoproliferative glomerulonephritis), type 2   .  Acute renal failure   .  Dehydration   .  C. difficile colitis   .  Seizure disorder, grand mal   .  Encephalopathy acute   .  Renal failure (ARF), acute on chronic   .  Cardiomyopathy   .  Acute respiratory failure   .  Pulmonary edema   .  Hemorrhagic shock, due to intraabdominal bleeding following paracentesis   .  Hypervolemia   .  Ascites    Past Medical History:  Past Medical History   Diagnosis  Date   .  Pneumonia      'walking pneumonia'   .  Seizures    .  Anemia    .  Heart attack    .  Cardiomyopathy    .  Heart failure    .  CHF (congestive heart failure)    .  Ileitis    .  Chronic kidney disease    .  Renal disorder    .  MPGN (membranoproliferative glomerulonephritis), type 2    .  Hypertension    .  Brain injury     Past Surgical History:  Past Surgical History   Procedure  Date   .  Renal biopsy     Assessment & Plan  Clinical Impression: Patient is a 21 y.o. year old female with history of C3 nephropathy, anoxic brain injury ( well known to CIR from prior stay), seizure, cardiomyopathy, hypertension with history of somnolence 4-5 days PTA and found to be hypoxic by [facility]. Admitted for workup on 09/05/. Patient's ammonia level was found to be high one week PTA by patient's neurologist and was started on  Keppra and the plan was to taper off Depakote. Work up revealed elevated Depakote, chest x-ray shows infiltrates concerning for pneumonia and tense ascites with increased lower extremity fluid. HCAP treated with IV antibiotics. Renal consulted for input and recommended dialysis for acute on chronic renal failure and volume overload. Patient underwent with tense ascites and underwent 10 Liters paracentesis but developed worsening of hypotension with decreased MS and required intubation on 09/07. She developed ABLA due to intra-abdominal bleed with hgb 1.3 with thrombocytopenia and transfused with 8 U pRBC, 4 plts, and 4 FFP. Extubated on 09/10. Required CVVH and tunneled HD cath placed due to HD needs. Creatinine on downward trend but requiring HD MWF. Family has refuse dialysis cath for now--plans for placement in the future once family ready to proceed. needs encouragement for po Intake. Patient noted to be deconditioned with continued cognitive deficits. Patient transferred to CIR on 12/09/2011 .   Patient presents with decreased activity tolerance, decreased functional mobility, decreased balance,decreased safety/awareness, decreased cognition, and decreased  awareness of community resources pertaining to leisure interests.  Leisure History/Participation Premorbid leisure interest/current participation: Games - Jig-saw puzzles;Games - Word-search;Crafts - Painting;Crafts - Other (Comment);Community - Press photographer - Physicist, medical (coloring) Expression Interests: Music (Comment) Other Leisure Interests: Television;Movies;Computer;Cooking/Baking Leisure Participation Style: With Family/Friends Awareness of Community Resources: Fair-identify 2 post discharge leisure resources Psychosocial / Spiritual Patient agreeable to Pet Therapy: Yes Does patient have pets?: No (just recently lost dog) Social interaction - Mood/Behavior: Cooperative Firefighter Appropriate for Education?:  Yes Patient Agreeable to [employer]?: Yes Recreational Therapy Orientation Orientation -Reviewed with patient: Available activity resources Strengths/Weaknesses Patient Strengths/Abilities: Willingness to participate Patient weaknesses: Physical limitations  Plan Rec Therapy Plan Is patient appropriate for Therapeutic Recreation?: Yes Rehab Potential: Good Treatment times per week: Min 1 time per week >20 minutes Estimated Length of Stay:  2 weeks TR Treatment/Interventions: Adaptive equipment instruction;1:1 session;Cognitive remediation/compensation;Community reintegration;Functional mobility training;Patient/family education;Recreation/leisure participation;Therapeutic activities  Recommendations for other services: None  Discharge Criteria: Patient will be discharged from TR if patient refuses treatment 3 consecutive times without medical reason.  If treatment goals not met, if there is a change in medical status, if patient makes no progress towards goals or if patient is discharged from hospital.  The above assessment, treatment plan, treatment alternatives and goals were discussed and mutually agreed upon: by patient  Lizzeth Meder 12/10/2011, 3:54 PM

## 2011-12-11 ENCOUNTER — Inpatient Hospital Stay (HOSPITAL_COMMUNITY): Payer: Medicaid Other | Admitting: Occupational Therapy

## 2011-12-11 ENCOUNTER — Inpatient Hospital Stay (HOSPITAL_COMMUNITY): Payer: Medicaid Other | Admitting: Speech Pathology

## 2011-12-11 ENCOUNTER — Inpatient Hospital Stay (HOSPITAL_COMMUNITY): Payer: Medicaid Other | Admitting: Physical Therapy

## 2011-12-11 ENCOUNTER — Inpatient Hospital Stay (HOSPITAL_COMMUNITY): Payer: Medicaid Other

## 2011-12-11 MED ORDER — CLONAZEPAM 0.5 MG PO TABS
0.5000 mg | ORAL_TABLET | Freq: Two times a day (BID) | ORAL | Status: DC
  Administered 2011-12-11 – 2011-12-19 (×17): 0.5 mg via ORAL
  Filled 2011-12-11 (×17): qty 1

## 2011-12-11 NOTE — Progress Notes (Signed)
Physical Therapy Note  Patient Details  Name: SIRENIA WHITIS MRN: [id number] Date of Birth: 25-Oct-1990 Today's Date: 12/11/2011  Time 1: 900-945 45 minutes  1:1 No c/o pain.  Sit to stand training with RW with focus on full knee extension, pt better able to activate quads when performing mini squats with tactile cues for full extension.  Gait training with RW 3 x 15' with min-mod A.  Pt requires increased assist to advance RW, min A for balance.  Squat pivot transfer training with min-mod A.  Pt requires cuing for UE/LE placement each attempt, little carryover during session.  Pt continues with increased anxiety requiring much encouragement for each activity.  Time 2: 1300-1345 45 minutes  1:1 No c/o pain.  Supine stretching to B hamstrings, with contract/relax and overpressure, pt able to improve AAROM, limited by pain from stretching at end range.  Standing balance training horseshoe game with focus on forward wt shifts, 1 UE support with min A due to myoclonic jerks continuously in LEs.  Gait short distances with mod A with RW, pt able to maintain balance with min A, requires mod A to advance RW.  Standing connect four game with pt requiring min A for balance, max cuing to correctly play and attend to game.    Brylen Wagar 12/11/2011, 9:44 AM

## 2011-12-11 NOTE — Progress Notes (Signed)
Subjective/Complaints: Rested well. In good spirits. Ate most of breakfast  Objective: Vital Signs: Blood pressure 132/101, pulse 100, temperature 97.9 F (36.6 C), temperature source Oral, resp. rate 20, weight 44.9 kg (98 lb 15.8 oz), last menstrual period 11/30/2011, SpO2 100.00%. No results found.  Basename 12/09/11 0845  WBC 9.2  HGB 12.9  HCT 39.7  PLT 219    Basename 12/09/11 0845  NA 134*  K 3.8  CL 100  CO2 29  GLUCOSE 81  BUN 16  CREATININE 2.36*  CALCIUM 9.0   CBG (last 3)  No results found for this basename: GLUCAP:3 in the last 72 hours  Wt Readings from Last 3 Encounters:  12/11/11 44.9 kg (98 lb 15.8 oz)  12/09/11 44.7 kg (98 lb 8.7 oz)  12/09/11 44.7 kg (98 lb 8.7 oz)    Physical Exam:  Constitutional: She appears well-developed.  Thin female.  HENT:  Head: Normocephalic.  Eyes: Pupils are equal, round, and reactive to light.  Cardiovascular: Regular rhythm. Tachycardia present.  Pulmonary/Chest: Effort normal. She has decreased breath sounds in the left lower field.  Abdominal: Soft. Bowel sounds are normal. She exhibits no distension. There is no tenderness.  Musculoskeletal: She exhibits no edema and no tenderness.  Spasticity BLE >BUE on testing.  Neurological: She is alert.  Follows basic commands. Speech simple and childlike but more spontaneous.  Resting tremors. Myoclonus in legs.   Assessment/Plan: 1. Functional deficits secondary to deconditioning, hx of anoxic encephalopathy which require 3+ hours per day of interdisciplinary therapy in a comprehensive inpatient rehab setting. Physiatrist is providing close team supervision and 24 hour management of active medical problems listed below. Physiatrist and rehab team continue to assess barriers to discharge/monitor patient progress toward functional and medical goals. FIM: FIM - Bathing Bathing Steps Patient Completed: Chest;Right Arm;Left Arm;Abdomen;Right upper leg;Left upper leg;Left  lower leg (including foot);Right lower leg (including foot) Bathing: 4: Min-Patient completes 8-9 20f 10 parts or 75+ percent  FIM - Upper Body Dressing/Undressing Upper body dressing/undressing steps patient completed: Thread/unthread right sleeve of pullover shirt/dresss;Thread/unthread left sleeve of pullover shirt/dress;Put head through opening of pull over shirt/dress Upper body dressing/undressing: 4: Min-Patient completed 75 plus % of tasks FIM - Lower Body Dressing/Undressing Lower body dressing/undressing steps patient completed: Thread/unthread right pants leg;Thread/unthread left pants leg;Don/Doff right sock;Don/Doff left sock;Fasten/unfasten right shoe;Fasten/unfasten left shoe Lower body dressing/undressing: 3: Mod-Patient completed 50-74% of tasks        FIM - Bed/Chair Transfer Bed/Chair Transfer: 3: Sit > Supine: Mod A (lifting assist/Pt. 50-74%/lift 2 legs);1: Chair or W/C > Bed: Total A (helper does all/Pt. < 25%)  FIM - Locomotion: Wheelchair Locomotion: Wheelchair: 2: Travels 50 - 149 ft with supervision, cueing or coaxing FIM - Locomotion: Ambulation Ambulation/Gait Assistance: 1: +2 Total assist (2nd person for safety) Locomotion: Ambulation: 1: Two helpers  Comprehension Comprehension Mode: Auditory Comprehension: 5-Understands complex 90% of the time/Cues < 10% of the time  Expression Expression Mode: Verbal Expression: 2-Expresses basic 25 - 49% of the time/requires cueing 50 - 75% of the time. Uses single words/gestures.  Social Interaction Social Interaction: 2-Interacts appropriately 25 - 49% of time - Needs frequent redirection.  Problem Solving Problem Solving: 2-Solves basic 25 - 49% of the time - needs direction more than half the time to initiate, plan or complete simple activities  Memory Memory: 2-Recognizes or recalls 25 - 49% of the time/requires cueing 51 - 75% of the time  Medical Problem List and Plan:  1. DVT  Prophylaxis/Anticoagulation: Mechanical: Sequential compression devices, below knee Bilateral lower extremities  2. Pain Management: tylenol prn.  3. Mood: will require ego support and encouragement for participation. Patient lacks insight into her deficits.  4. Neuropsych: This patient is not capable of making decisions on his/her own behalf.  5. ESRD: Continue HD M-W-F. Daily weights. Strict I/O.  6. Anorexia: Continue megace bid. Offer ensure supplements bid to help with nutritional support. Continue low salt diet. No FR noted.  7. Seizures: Continue keppra bid 8. Myoclonus: resume low dose klonopin  LOS (Days) 2 A FACE TO FACE EVALUATION WAS PERFORMED  Jaquae Rieves T 12/11/2011, 8:14 AM

## 2011-12-11 NOTE — Progress Notes (Signed)
Occupational Therapy Session Note  Patient Details  Name: Heather Sims MRN: [id number] Date of Birth: 12-11-1990  Today's Date: 12/11/2011 Time: 4098-1191 Time Calculation (min): 45 min  Short Term Goals: Week 1:  OT Short Term Goal 1 (Week 1): Pt would bathe 10/10 parts with min A OT Short Term Goal 2 (Week 1): Pt will don pants with min A sit to stand OT Short Term Goal 3 (Week 1): Pt will maintain standing position for pericare/ clothing management with min A with bilateral UE support OT Short Term Goal 4 (Week 1): Pt will perform scoot transfer with min A OT Short Term Goal 5 (Week 1): Pt will transfer stand pivot with mod A  Skilled Therapeutic Interventions/Progress Updates:    1:1 self care retraining at sink level including bathing and dressing focus on memory of days' and recent events, sit to stand, stand pivot transfers, standing balance with upright trunk posture, functional mobility, w/c setup (locking and unlocking brakes), activity tolerance, endurance training, simple problem solving, toilet transfer to regular commode and toilet. Pt able to make needs and wants know today with supervision (wanting a drink and crackers, toileting needs, fatigue- rest breaks)  Therapy Documentation Precautions:  Precautions Precautions: Fall Restrictions Weight Bearing Restrictions: No Pain:  no c/o pain just c/o nausea.  See FIM for current functional status  Therapy/Group: Individual Therapy  Roney Mans [facility] 12/11/2011, 10:49 AM

## 2011-12-11 NOTE — Progress Notes (Signed)
Speech Language Pathology Daily Session Note  Patient Details  Name: Heather Sims MRN: [id number] Date of Birth: August 30, 1990  Today's Date: 12/11/2011 Time: 0805-0830 Time Calculation (min): 25 min  Short Term Goals: Week 1: SLP Short Term Goal 1 (Week 1): Pt will express wants/needs with Mod verbal and question cues.  SLP Short Term Goal 2 (Week 1): pt will demonstrate intellectual awareness and identify 1 physical and 1 cognitive change with Mod semantic cues.  SLP Short Term Goal 3 (Week 1): Pt will utilize external memory aids to recall new, daily information with Mod visual and question cues.  SLP Short Term Goal 4 (Week 1): Pt will demonstrate functional problem solving for functional and familiar tasks with Mod verbal cues.   Skilled Therapeutic Interventions: Treatment session focused on addressing cognitive goals.  SLP facilitated session with mild environmental distractions and min assist verbal cues to problem solve use of menu to order lunch and dinner as well with mod assist verbal cues to recall including things like dressing for salad and beverage with order.  Patient with supervision cues to return demonstration appropriate use of call bell and requested needs appropriately throughout session.      FIM:  Comprehension Comprehension Mode: Auditory Comprehension: 5-Follows basic conversation/direction: With extra time/assistive device Expression Expression Mode: Verbal Expression: 3-Expresses basic 50 - 74% of the time/requires cueing 25 - 50% of the time. Needs to repeat parts of sentences. Social Interaction Social Interaction: 4-Interacts appropriately 75 - 89% of the time - Needs redirection for appropriate language or to initiate interaction. Problem Solving Problem Solving: 3-Solves basic 50 - 74% of the time/requires cueing 25 - 49% of the time Memory Memory: 3-Recognizes or recalls 50 - 74% of the time/requires cueing 25 - 49% of the time  Pain Pain  Assessment Pain Assessment: No/denies pain  Therapy/Group: Individual Therapy  Charlane Ferretti., CCC-SLP [phone]  Heather Sims 12/11/2011, 11:02 AM

## 2011-12-11 NOTE — Progress Notes (Signed)
PA Student Daily Progress Note [facility] Subjective:  PT feeling well this morning.  She denies any SOB or difficulty breathing.  She reports a mild headache but no other discomfort.  PT has been urinating.  Unsure of the quantity of urine.   Objective:  Filed Vitals:   12/10/11 0645 12/10/11 1100 12/10/11 1545 12/11/11 0500  BP: 124/104  126/83 132/101  Pulse:   87 100  Temp:   98.7 F (37.1 C) 97.9 F (36.6 C)  TempSrc:   Oral Oral  Resp:   20 20  Weight:  44.7 kg (98 lb 8.7 oz)  44.9 kg (98 lb 15.8 oz)  SpO2:   95% 100%   Physical Exam: General:Pt is alert, sitting in bed in no acute distress.  Cardio: tachycardic, S1, S2, systolic murmer loudest at left 2nd ICS  Respiratory:pt breathing normally, without use of accessory muscles. lungs clear to auscultation bilaterally  Abdomen: soft, nontender, mildly distention, noted active BS  Extremities: warm, well perfused, no lower extremity edema  Neurologic: Pt oriented to person and place. Pt much more verbal than she was earlier this week.    Assessment/Plan: 1. New ESRD - last HD 12/09/11. Scheduled for HD today. No labs yet today.  Pt urinated 4 times yesterday, quantity not recorded.  Pt has in & out catch ordered every 8 hrs without voiding.  Consider d/c catheterization- yes will do Wt today 98 lbs.  Tunneled HD cath placed on 12/04/11. Vein study in July & plan for AV fistula placement before discharge home so that pt is able to fully participate in rehab. Continue HD MWF for now.  Looking that will get OP HD at [facility], schedule to be determined 2. HTN - BP much better controlled with reduced volume since starting HD 126/83 - 132/101. Pt on amlodipine, carvedilol, lisinopril & HD. Much better than baseline but still over goal.  3. Anoxic Brain Injury following PEA arrest - Stable, chronic. Pt's mother reports pt is much improved but not quite back to baseline yet.  4. Cardiomyopathy. Repeat 2D Echo showed no change from  baseline.  5. Seizure disorder - Keppra PO.  6.Anemia- last Hgb 12.9, no ESA needed  7. Bones- last PTH in double digits,no vitamin D needed. Will recheck PTH. Phos 3, no binders needed.  8. Dispo- PT and eventually home with mother who takes good care of her  55. Hypophosphatemia- Megace started this week with subsequent increase in PO intake. Last Phos within normal range, monitor need for binders.  Labs: Basic Metabolic Panel:  Lab 12/09/11 4098 12/08/11 0540 12/07/11 0433  NA 134* 137 135  K 3.8 3.6 3.8  CL 100 102 100  CO2 29 31 27   GLUCOSE 81 85 84  BUN 16 10 24*  CREATININE 2.36* 1.88* 2.43*  CALCIUM 9.0 8.7 8.7  ALB -- -- --  PHOS 3.0 1.6* 2.6   Liver Function Tests:  Lab 12/09/11 0845 12/08/11 0540 12/07/11 0433  AST -- -- --  ALT -- -- --  ALKPHOS -- -- --  BILITOT -- -- --  PROT -- -- --  ALBUMIN 1.9* 1.9* 1.7*   CBC:  Lab 12/09/11 0845 12/08/11 0540 12/07/11 0433 12/06/11 0801 12/05/11 0630  WBC 9.2 10.2 11.3* -- --  NEUTROABS -- -- -- -- --  HGB 12.9 12.1 12.3 -- --  HCT 39.7 37.2 37.2 -- --  MCV 91.3 92.1 90.5 90.4 90.4  PLT 219 196 204 -- --   Blood  Culture    Component Value Date/Time   SDES STOOL 12/06/2011 1135   SPECREQUEST NONE 12/06/2011 1135   CULT NO GROWTH 5 DAYS 11/28/2011 2245   REPTSTATUS 12/08/2011 FINAL 12/06/2011 1135   CBG:  Lab 12/05/11 0840 12/04/11 2144 12/04/11 1555 12/04/11 1222 12/04/11 0806  GLUCAP 84 84 82 75 83   Micro Results: Recent Results (from the past 240 hour(s))  CULTURE, EXPECTORATED SPUTUM-ASSESSMENT     Status: Normal   Collection Time   12/03/11  5:33 PM      Component Value Range Status Comment   Specimen Description SPUTUM   Final    Special Requests NONE   Final    Sputum evaluation     Final    Value: MICROSCOPIC FINDINGS SUGGEST THAT THIS SPECIMEN IS NOT REPRESENTATIVE OF LOWER RESPIRATORY SECRETIONS. PLEASE RECOLLECT.     CALLED TO RN K.PORTER AT 2300 BY L.PITT 12/03/11   Report Status 12/04/2011  FINAL   Final   SURGICAL PCR SCREEN     Status: Abnormal   Collection Time   12/09/11  6:45 AM      Component Value Range Status Comment   MRSA, PCR NEGATIVE  NEGATIVE Final    Staphylococcus aureus POSITIVE (*) NEGATIVE Final    Medications: Scheduled Meds:   . amLODipine  10 mg Oral Daily  . carvedilol  12.5 mg Oral BID WC  . feeding supplement  237 mL Oral BID BM  . levETIRAcetam  250 mg Oral BID  . lisinopril  20 mg Oral Daily  . megestrol  400 mg Oral BID   Continuous Infusions:  PRN Meds:.alum & mag hydroxide-simeth, bisacodyl, camphor-menthol, diphenhydrAMINE, guaiFENesin-dextromethorphan, heparin, heparin, heparin, hydrOXYzine, lidocaine, lidocaine-prilocaine, methocarbamol, ondansetron (ZOFRAN) IV, ondansetron, pentafluoroprop-tetrafluoroeth, polyethylene glycol, prochlorperazine, prochlorperazine, prochlorperazine   This is a Psychologist, occupational Note.  The care of the patient was discussed with Dr. Kathrene Bongo and the assessment and plan formulated with their assistance.  Please see their attached note for official documentation of the daily encounter.   Delmer Islam PA-S2 12/11/2011, 7:43 AM  Patient seen and examined, agree with above note with above modifications.  21 year old BF new start to HD, tolerating and thriving, continue MWF and plan for permanent access just prior to discharge.  No need for I and O cath as would not expect much UOP now that on HD Annie Sable, MD 12/11/2011

## 2011-12-12 ENCOUNTER — Inpatient Hospital Stay (HOSPITAL_COMMUNITY): Payer: Medicaid Other | Admitting: Occupational Therapy

## 2011-12-12 ENCOUNTER — Inpatient Hospital Stay (HOSPITAL_COMMUNITY): Payer: Medicaid Other

## 2011-12-12 DIAGNOSIS — R5381 Other malaise: Secondary | ICD-10-CM

## 2011-12-12 DIAGNOSIS — N186 End stage renal disease: Secondary | ICD-10-CM

## 2011-12-12 DIAGNOSIS — Z5189 Encounter for other specified aftercare: Secondary | ICD-10-CM

## 2011-12-12 DIAGNOSIS — G931 Anoxic brain damage, not elsewhere classified: Secondary | ICD-10-CM

## 2011-12-12 MED ORDER — HEPARIN SODIUM (PORCINE) 1000 UNIT/ML DIALYSIS: 20.0000 [IU]/kg | INTRAMUSCULAR | Status: DC | PRN

## 2011-12-12 MED ORDER — ACETAMINOPHEN 325 MG PO TABS
650.0000 mg | ORAL_TABLET | ORAL | Status: DC | PRN
  Administered 2011-12-15 – 2011-12-19 (×3): 650 mg via ORAL
  Filled 2011-12-12 (×4): qty 2

## 2011-12-12 MED ORDER — AMLODIPINE BESYLATE 5 MG PO TABS
5.0000 mg | ORAL_TABLET | Freq: Every day | ORAL | Status: DC
  Administered 2011-12-12 – 2011-12-13 (×2): 5 mg via ORAL
  Filled 2011-12-12 (×2): qty 1

## 2011-12-12 NOTE — Progress Notes (Signed)
Occupational Therapy Session Note  Patient Details  Name: Heather Sims MRN: [id number] Date of Birth: 05-25-1990  Today's Date: 12/12/2011 Time: 1191-4782 Time Calculation (min): 45 min  Short Term Goals: Week 1:  OT Short Term Goal 1 (Week 1): Pt would bathe 10/10 parts with min A OT Short Term Goal 2 (Week 1): Pt will don pants with min A sit to stand OT Short Term Goal 3 (Week 1): Pt will maintain standing position for pericare/ clothing management with min A with bilateral UE support OT Short Term Goal 4 (Week 1): Pt will perform scoot transfer with min A OT Short Term Goal 5 (Week 1): Pt will transfer stand pivot with mod A  Skilled Therapeutic Interventions/Progress Updates:    1:1 self care retraining to including bathing and dressing in recliner. Treatment focused on stand pivot transfers, sit to stands, standing balance, balance without UE support to pull up pants, toileting, toilet transfers, functional mobility, activity tolerance, endurance, functional ambulation from toilet to w/c outside of bathroom with min A with A to steer RW. Pt remains very close inside walker for support.  Therapy Documentation Precautions:  Precautions Precautions: Fall Restrictions Weight Bearing Restrictions: No Pain:  no c/o pain  See FIM for current functional status  Therapy/Group: Individual Therapy  Roney Mans [facility] 12/12/2011, 10:21 AM

## 2011-12-12 NOTE — Progress Notes (Signed)
Physical Therapy Session Note  Patient Details  Name: Heather Sims MRN: [id number] Date of Birth: Mar 03, 1991  Today's Date: 12/12/2011 Time: 1050-1120 Time Calculation (min): 30 min  Short Term Goals: Week 1:  PT Short Term Goal 1 (Week 1): Pt will perform functional transfers with mod A PT Short Term Goal 2 (Week 1): Pt will gait with max A 25' in controlled environment PT Short Term Goal 3 (Week 1): Pt will demo intellectual awareness in functional activity with min A  Skilled Therapeutic Interventions/Progress Updates:  Patient sit to stand with min assist and ambulated 18 feet, 24 feet, and 60 feet with rolling walker and min assist. Patient with flexed hips and knees during ambulation. Patient worked on Print production planner and coordination on Nu[employer] for 2 minutes x 2 at work load 4. Patient stood x 3 minutes to play checkers. Patient needed cueing for directions for the game.  Therapy Documentation Precautions:  Precautions Precautions: Fall Restrictions Weight Bearing Restrictions: No  Pain: Pain Assessment Pain Assessment: No/denies pain  Locomotion : Ambulation Ambulation/Gait Assistance: 4: Min guard   See FIM for current functional status  Therapy/Group: Individual Therapy  Alma Friendly 12/12/2011, 12:48 PM

## 2011-12-12 NOTE — Progress Notes (Signed)
Physical Therapy Session Note  Patient Details  Name: Heather Sims MRN: [id number] Date of Birth: Mar 14, 1991  Today's Date: 12/12/2011 Time: 5621-3086 Time Calculation (min): 40 min  Short Term Goals: Week 1:  PT Short Term Goal 1 (Week 1): Pt will perform functional transfers with mod A PT Short Term Goal 2 (Week 1): Pt will gait with max A 25' in controlled environment PT Short Term Goal 3 (Week 1): Pt will demo intellectual awareness in functional activity with min A  Skilled Therapeutic Interventions/Progress Updates:  Patient supine to sit with min assist using bed rails. Patient transferred bed to wheelchair using stand pivot with rolling walker and min assist. Patient used Wii boxing and dance for upper extremity strength and endurance training in sitting. Patient ambulated with rolling walker and min assist 60 feet.  Therapy Documentation Precautions:  Precautions Precautions: Fall Restrictions Weight Bearing Restrictions: No  Pain: Pain Assessment Pain Assessment: No/denies pain  Locomotion : Ambulation Ambulation/Gait Assistance: 4: Min guard   See FIM for current functional status  Therapy/Group: Individual Therapy  Alma Friendly 12/12/2011, 3:33 PM

## 2011-12-12 NOTE — Progress Notes (Signed)
Patient ID: Heather Sims, female   DOB: Sep 21, 1990, 21 y.o.   MRN: [id number] Subjective/Complaints: Rested well. In good spirits. Ate most of breakfast  Objective: Vital Signs: Blood pressure 147/98, pulse 95, temperature 97.7 F (36.5 C), temperature source Axillary, resp. rate 18, weight 44.5 kg (98 lb 1.7 oz), last menstrual period 11/30/2011, SpO2 100.00%. No results found.  Basename 12/09/11 0845  WBC 9.2  HGB 12.9  HCT 39.7  PLT 219    Basename 12/09/11 0845  NA 134*  K 3.8  CL 100  CO2 29  GLUCOSE 81  BUN 16  CREATININE 2.36*  CALCIUM 9.0   CBG (last 3)  No results found for this basename: GLUCAP:3 in the last 72 hours  Wt Readings from Last 3 Encounters:  12/11/11 44.5 kg (98 lb 1.7 oz)  12/09/11 44.7 kg (98 lb 8.7 oz)  12/09/11 44.7 kg (98 lb 8.7 oz)    Physical Exam:  Constitutional: She appears well-developed.  Thin female.  HENT:  Head: Normocephalic.  Eyes: Pupils are equal, round, and reactive to light.  Cardiovascular: Regular rhythm. Tachycardia present.  Pulmonary/Chest: Effort normal. She has decreased breath sounds in the left lower field.  Abdominal: Soft. Bowel sounds are normal. She exhibits no distension. There is no tenderness.  Musculoskeletal: She exhibits no edema and no tenderness.  Spasticity BLE >BUE on testing.  Neurological: She is alert.  Follows basic commands. Speech simple and childlike but more spontaneous.  Resting tremors. Myoclonus in legs.   Assessment/Plan: 1. Functional deficits secondary to deconditioning, hx of anoxic encephalopathy which require 3+ hours per day of interdisciplinary therapy in a comprehensive inpatient rehab setting. Physiatrist is providing close team supervision and 24 hour management of active medical problems listed below. Physiatrist and rehab team continue to assess barriers to discharge/monitor patient progress toward functional and medical goals. FIM: FIM - Bathing Bathing Steps Patient  Completed: Chest;Right Arm;Left Arm;Abdomen;Right upper leg;Left upper leg;Left lower leg (including foot);Right lower leg (including foot) Bathing: 4: Min-Patient completes 8-9 22f 10 parts or 75+ percent  FIM - Upper Body Dressing/Undressing Upper body dressing/undressing steps patient completed: Thread/unthread right sleeve of pullover shirt/dresss;Thread/unthread left sleeve of pullover shirt/dress;Put head through opening of pull over shirt/dress Upper body dressing/undressing: 4: Min-Patient completed 75 plus % of tasks FIM - Lower Body Dressing/Undressing Lower body dressing/undressing steps patient completed: Thread/unthread right pants leg;Thread/unthread left pants leg;Don/Doff right sock;Don/Doff left sock;Fasten/unfasten right shoe;Fasten/unfasten left shoe Lower body dressing/undressing: 3: Mod-Patient completed 50-74% of tasks        FIM - Bed/Chair Transfer Bed/Chair Transfer: 3: Chair or W/C > Bed: Mod A (lift or lower assist);3: Bed > Chair or W/C: Mod A (lift or lower assist)  FIM - Locomotion: Wheelchair Locomotion: Wheelchair: 2: Travels 50 - 149 ft with supervision, cueing or coaxing FIM - Locomotion: Ambulation Ambulation/Gait Assistance: 1: +2 Total assist (2nd person for safety) Locomotion: Ambulation: 1: Travels less than 50 ft with maximal assistance (Pt: 25 - 49%)  Comprehension Comprehension Mode: Auditory Comprehension: 5-Follows basic conversation/direction: With extra time/assistive device  Expression Expression Mode: Verbal Expression: 3-Expresses basic 50 - 74% of the time/requires cueing 25 - 50% of the time. Needs to repeat parts of sentences.  Social Interaction Social Interaction: 4-Interacts appropriately 75 - 89% of the time - Needs redirection for appropriate language or to initiate interaction.  Problem Solving Problem Solving: 3-Solves basic 50 - 74% of the time/requires cueing 25 - 49% of the time  Memory Memory: 3-Recognizes or  recalls  50 - 74% of the time/requires cueing 25 - 49% of the time  Medical Problem List and Plan:  1. DVT Prophylaxis/Anticoagulation: Mechanical: Sequential compression devices, below knee Bilateral lower extremities  2. Pain Management: tylenol prn.  3. Mood: will require ego support and encouragement for participation. Patient lacks insight into her deficits.  4. Neuropsych: This patient is not capable of making decisions on his/her own behalf.  5. ESRD: Continue HD M-W-F. Daily weights. Strict I/O.  6. Anorexia: Continue megace bid. Offer ensure supplements bid to help with nutritional support. Continue low salt diet. No FR noted.  7. Seizures: Continue keppra bid 8. Myoclonus: resume low dose klonopin  LOS (Days) 3 A FACE TO FACE EVALUATION WAS PERFORMED  KIRSTEINS,ANDREW E 12/12/2011, 6:54 AM

## 2011-12-12 NOTE — Progress Notes (Addendum)
[facility] Subjective:  Tolerated HD last night with 3 liters of volume removal.  Said dialysis was fun last night ?  Objective:  Filed Vitals:   12/11/11 2100 12/11/11 2126 12/11/11 2215 12/12/11 0550  BP: 121/37 115/77 122/79 147/98  Pulse: 101 94 88 95  Temp:  98.2 F (36.8 C) 97.9 F (36.6 C) 97.7 F (36.5 C)  TempSrc:  Oral Oral Axillary  Resp: 16 16 16 18   Weight:  44.5 kg (98 lb 1.7 oz)    SpO2:   98% 100%   Physical Exam: General:Pt is alert, sitting in bed in no acute distress.  Cardio: tachycardic, S1, S2, systolic murmer loudest at left 2nd ICS  Respiratory:pt breathing normally, without use of accessory muscles. lungs clear to auscultation bilaterally  Abdomen: soft, nontender, mildly distention, noted active BS  Extremities: warm, well perfused, no lower extremity edema  Neurologic: Pt oriented to person and place.  Assessment/Plan: 1. New ESRD - Have been dialyzing on MWF schedule while here  Tunneled HD cath placed on 12/04/11. Vein study in July & plan for AV fistula placement before discharge home so that pt is able to fully participate in rehab.  Looking that will get OP HD at [facility], schedule to be determined 2. HTN - BP much better controlled with reduced volume since starting HD 126/83 - 132/101. Pt on amlodipine, carvedilol, lisinopril & HD. Much better than baseline but still over goal. Will titrate amlodipine down and look to possibly wean off at time of dicharge 3. Anoxic Brain Injury following PEA arrest - Stable, chronic. Pt's mother reports pt is much improved but not quite back to baseline yet.  4. Cardiomyopathy. Repeat 2D Echo showed no change from baseline.  5. Seizure disorder - Keppra PO.  6.Anemia- last Hgb 12.9, no ESA needed  7. Bones- last PTH in double digits,no vitamin D needed. . Phos 3, no binders needed.  8. Dispo- PT and eventually home with mother who takes good care of her  60. Hypophosphatemia- Megace started this week with  subsequent increase in PO intake. Last Phos within normal range, monitor need for binders.  Renal will plan to not see patient tomorrow but revisit on Monday.  Call if any questions before Monday  Labs: Basic Metabolic Panel:  Lab 12/09/11 1610 12/08/11 0540 12/07/11 0433  NA 134* 137 135  K 3.8 3.6 3.8  CL 100 102 100  CO2 29 31 27   GLUCOSE 81 85 84  BUN 16 10 24*  CREATININE 2.36* 1.88* 2.43*  CALCIUM 9.0 8.7 8.7  ALB -- -- --  PHOS 3.0 1.6* 2.6   Liver Function Tests:  Lab 12/09/11 0845 12/08/11 0540 12/07/11 0433  AST -- -- --  ALT -- -- --  ALKPHOS -- -- --  BILITOT -- -- --  PROT -- -- --  ALBUMIN 1.9* 1.9* 1.7*   CBC:  Lab 12/09/11 0845 12/08/11 0540 12/07/11 0433 12/06/11 0801  WBC 9.2 10.2 11.3* --  NEUTROABS -- -- -- --  HGB 12.9 12.1 12.3 --  HCT 39.7 37.2 37.2 --  MCV 91.3 92.1 90.5 90.4  PLT 219 196 204 --   Blood Culture    Component Value Date/Time   SDES STOOL 12/06/2011 1135   SPECREQUEST NONE 12/06/2011 1135   CULT NO GROWTH 5 DAYS 11/28/2011 2245   REPTSTATUS 12/08/2011 FINAL 12/06/2011 1135   CBG:  Lab 12/05/11 0840  GLUCAP 84   Micro Results: Recent Results (from the past 240  hour(s))  CULTURE, EXPECTORATED SPUTUM-ASSESSMENT     Status: Normal   Collection Time   12/03/11  5:33 PM      Component Value Range Status Comment   Specimen Description SPUTUM   Final    Special Requests NONE   Final    Sputum evaluation     Final    Value: MICROSCOPIC FINDINGS SUGGEST THAT THIS SPECIMEN IS NOT REPRESENTATIVE OF LOWER RESPIRATORY SECRETIONS. PLEASE RECOLLECT.     CALLED TO RN K.PORTER AT 2300 BY L.PITT 12/03/11   Report Status 12/04/2011 FINAL   Final   SURGICAL PCR SCREEN     Status: Abnormal   Collection Time   12/09/11  6:45 AM      Component Value Range Status Comment   MRSA, PCR NEGATIVE  NEGATIVE Final    Staphylococcus aureus POSITIVE (*) NEGATIVE Final    Medications: Scheduled Meds:    . amLODipine  10 mg Oral Daily  .  carvedilol  12.5 mg Oral BID WC  . clonazePAM  0.5 mg Oral BID  . feeding supplement  237 mL Oral BID BM  . levETIRAcetam  250 mg Oral BID  . lisinopril  20 mg Oral Daily  . megestrol  400 mg Oral BID   Continuous Infusions:  PRN Meds:.acetaminophen, alum & mag hydroxide-simeth, bisacodyl, camphor-menthol, diphenhydrAMINE, guaiFENesin-dextromethorphan, heparin, heparin, heparin, hydrOXYzine, lidocaine, lidocaine-prilocaine, methocarbamol, ondansetron (ZOFRAN) IV, ondansetron, pentafluoroprop-tetrafluoroeth, polyethylene glycol, prochlorperazine, prochlorperazine, prochlorperazine   Annie Sable, MD 12/12/2011

## 2011-12-12 NOTE — Progress Notes (Signed)
Occupational Therapy Session Note  Patient Details  Name: Heather Sims MRN: [id number] Date of Birth: Oct 28, 1990  Today's Date: 12/12/2011 Time: 1330-1400 Time Calculation (min): 30 min  Short Term Goals: Week 1:  OT Short Term Goal 1 (Week 1): Pt would bathe 10/10 parts with min A OT Short Term Goal 2 (Week 1): Pt will don pants with min A sit to stand OT Short Term Goal 3 (Week 1): Pt will maintain standing position for pericare/ clothing management with min A with bilateral UE support OT Short Term Goal 4 (Week 1): Pt will perform scoot transfer with min A OT Short Term Goal 5 (Week 1): Pt will transfer stand pivot with mod A  Skilled Therapeutic Interventions/Progress Updates:    1:1 Therapeutic exercise: With functional ambulation around room and in hallway- pt with decreased ability to extend at the knees. Focused on stretching hamstrings and IT band in supine - pt with decrease tolerance and unable to achieve >hip flexion due to tight hamstring. Also performed 15 sit ups from slightly elevated supine position, without UE support. Pt able to ambulate back to room with RW with increased extension in bilateral knees. (left LE tighter than right.)  Therapy Documentation Precautions:  Precautions Precautions: Fall Restrictions Weight Bearing Restrictions: No Pain: Pain Assessment Pain Assessment: No/denies pain  See FIM for current functional status  Therapy/Group: Individual Therapy  Roney Mans [facility] 12/12/2011, 3:15 PM

## 2011-12-13 ENCOUNTER — Inpatient Hospital Stay (HOSPITAL_COMMUNITY): Payer: Medicaid Other | Admitting: *Deleted

## 2011-12-13 MED ORDER — AMLODIPINE BESYLATE 10 MG PO TABS
10.0000 mg | ORAL_TABLET | Freq: Every day | ORAL | Status: DC
  Filled 2011-12-13 (×2): qty 1

## 2011-12-13 NOTE — Progress Notes (Signed)
Dr Kathrene Bongo called about patient's blood pressure this morning and Dr Wynn Banker notified. New orders received from Dr Kathrene Bongo. Evening blood pressure not as elevated. Patient denies any pain. Continue with plan of care.

## 2011-12-13 NOTE — Progress Notes (Signed)
Physical Therapy Note  Patient Details  Name: Heather Sims MRN: [id number] Date of Birth: 24-Dec-1990 Today's Date: 12/13/2011  Individual therapy Pain:  None Time:  1600-1630  (30 min)  Pt. Engaged in bathing and dressing at shower level.  Pt. Performed stand pivot transfer with max assist for lifting and lowering.  She was supervision with bathing and dressing.  The Lower extremities needed manual support to maintain stability due to the tremors she was having.  Returned pt to wc bedside bed with call bell and phone close by.  Pt. Started coloring.     Humberto Seals 12/13/2011, 5:50 PM

## 2011-12-13 NOTE — Progress Notes (Signed)
Patient ID: Heather Sims, female   DOB: Aug 22, 1990, 21 y.o.   MRN: [id number] Subjective/Complaints: No problems in therapy yesterday  Objective: Vital Signs: Blood pressure 170/118, pulse 97, temperature 98.6 F (37 C), temperature source Oral, resp. rate 18, weight 45.9 kg (101 lb 3.1 oz), last menstrual period 11/30/2011, SpO2 99.00%. No results found. No results found for this basename: WBC:2,HGB:2,HCT:2,PLT:2 in the last 72 hours No results found for this basename: NA:2,K:2,CL:2,CO2:2,GLUCOSE:2,BUN:2,CREATININE:2,CALCIUM:2 in the last 72 hours CBG (last 3)  No results found for this basename: GLUCAP:3 in the last 72 hours  Wt Readings from Last 3 Encounters:  12/13/11 45.9 kg (101 lb 3.1 oz)  12/09/11 44.7 kg (98 lb 8.7 oz)  12/09/11 44.7 kg (98 lb 8.7 oz)    Physical Exam:  Constitutional: She appears well-developed.  Thin female.  HENT:  Head: Normocephalic.  Eyes: Pupils are equal, round, and reactive to light.  Cardiovascular: Regular rhythm. Tachycardia present.  Pulmonary/Chest: Effort normal. She has decreased breath sounds in the left lower field.  Abdominal: Soft. Bowel sounds are normal. She exhibits no distension. There is no tenderness.  Musculoskeletal: She exhibits no edema and no tenderness.  Spasticity BLE >BUE on testing.  Neurological: She is alert.  Follows basic commands. Speech simple and childlike but more spontaneous.  Resting tremors. Myoclonus in legs.   Assessment/Plan: 1. Functional deficits secondary to deconditioning, hx of anoxic encephalopathy which require 3+ hours per day of interdisciplinary therapy in a comprehensive inpatient rehab setting. Physiatrist is providing close team supervision and 24 hour management of active medical problems listed below. Physiatrist and rehab team continue to assess barriers to discharge/monitor patient progress toward functional and medical goals. FIM: FIM - Bathing Bathing Steps Patient Completed:  Chest;Right Arm;Left Arm;Abdomen;Right upper leg;Left upper leg;Left lower leg (including foot);Right lower leg (including foot) Bathing: 4: Min-Patient completes 8-9 7f 10 parts or 75+ percent  FIM - Upper Body Dressing/Undressing Upper body dressing/undressing steps patient completed: Thread/unthread right sleeve of pullover shirt/dresss;Thread/unthread left sleeve of pullover shirt/dress;Put head through opening of pull over shirt/dress Upper body dressing/undressing: 4: Min-Patient completed 75 plus % of tasks FIM - Lower Body Dressing/Undressing Lower body dressing/undressing steps patient completed: Thread/unthread right pants leg;Thread/unthread left pants leg;Don/Doff right sock;Don/Doff left sock;Fasten/unfasten right shoe;Fasten/unfasten left shoe Lower body dressing/undressing: 3: Mod-Patient completed 50-74% of tasks  FIM - Toileting Toileting: 0: Activity did not occur  FIM - Archivist Transfers: 0-Activity did not occur  FIM - Games developer Transfer: 3: Bed > Chair or W/C: Mod A (lift or lower assist);3: Chair or W/C > Bed: Mod A (lift or lower assist)  FIM - Locomotion: Wheelchair Locomotion: Wheelchair: 1: Total Assistance/staff pushes wheelchair (Pt<25%) FIM - Locomotion: Ambulation Locomotion: Ambulation Assistive Devices: Designer, industrial/product Ambulation/Gait Assistance: 4: Min guard Locomotion: Ambulation: 2: Travels 50 - 149 ft with minimal assistance (Pt.>75%)  Comprehension Comprehension Mode: Auditory Comprehension: 5-Follows basic conversation/direction: With no assist  Expression Expression Mode: Verbal Expression: 3-Expresses basic 50 - 74% of the time/requires cueing 25 - 50% of the time. Needs to repeat parts of sentences.  Social Interaction Social Interaction: 4-Interacts appropriately 75 - 89% of the time - Needs redirection for appropriate language or to initiate interaction.  Problem Solving Problem Solving: 3-Solves  basic 50 - 74% of the time/requires cueing 25 - 49% of the time  Memory Memory: 3-Recognizes or recalls 50 - 74% of the time/requires cueing 25 - 49% of the time  Medical Problem List and  Plan:  1. DVT Prophylaxis/Anticoagulation: Mechanical: Sequential compression devices, below knee Bilateral lower extremities  2. Pain Management: tylenol prn.  3. Mood: will require ego support and encouragement for participation. Patient lacks insight into her deficits.  4. Neuropsych: This patient is not capable of making decisions on his/her own behalf.  5. ESRD: Continue HD M-W-F. Daily weights. Strict I/O.  6. Anorexia: Continue megace bid. Offer ensure supplements bid to help with nutritional support. Continue low salt diet. No FR noted.  7. Seizures: Continue keppra bid 8. Myoclonus: resume low dose klonopin  LOS (Days) 4 A FACE TO FACE EVALUATION WAS PERFORMED  [name] 12/13/2011, 7:06 AM

## 2011-12-14 ENCOUNTER — Inpatient Hospital Stay (HOSPITAL_COMMUNITY): Payer: Medicaid Other

## 2011-12-14 ENCOUNTER — Inpatient Hospital Stay (HOSPITAL_COMMUNITY): Payer: Medicaid Other | Admitting: Occupational Therapy

## 2011-12-14 ENCOUNTER — Inpatient Hospital Stay (HOSPITAL_COMMUNITY): Payer: Medicaid Other | Admitting: Physical Therapy

## 2011-12-14 DIAGNOSIS — Z5189 Encounter for other specified aftercare: Secondary | ICD-10-CM

## 2011-12-14 DIAGNOSIS — R5381 Other malaise: Secondary | ICD-10-CM

## 2011-12-14 DIAGNOSIS — G931 Anoxic brain damage, not elsewhere classified: Secondary | ICD-10-CM

## 2011-12-14 DIAGNOSIS — N186 End stage renal disease: Secondary | ICD-10-CM

## 2011-12-14 LAB — RENAL FUNCTION PANEL
Albumin: 2.2 g/dL — ABNORMAL LOW (ref 3.5–5.2)
BUN: 43 mg/dL — ABNORMAL HIGH (ref 6–23)
Creatinine, Ser: 2.39 mg/dL — ABNORMAL HIGH (ref 0.50–1.10)
Glucose, Bld: 90 mg/dL (ref 70–99)
Phosphorus: 2 mg/dL — ABNORMAL LOW (ref 2.3–4.6)

## 2011-12-14 LAB — CBC
HCT: 34.9 % — ABNORMAL LOW (ref 36.0–46.0)
Hemoglobin: 11.1 g/dL — ABNORMAL LOW (ref 12.0–15.0)
MCH: 29.8 pg (ref 26.0–34.0)
MCHC: 31.8 g/dL (ref 30.0–36.0)
MCV: 93.6 fL (ref 78.0–100.0)
RDW: 15 % (ref 11.5–15.5)

## 2011-12-14 LAB — POTASSIUM: Potassium: 3.5 mEq/L (ref 3.5–5.1)

## 2011-12-14 MED ORDER — RENA-VITE PO TABS
1.0000 | ORAL_TABLET | Freq: Every day | ORAL | Status: DC
  Administered 2011-12-14 – 2011-12-18 (×5): 1 via ORAL
  Filled 2011-12-14 (×6): qty 1

## 2011-12-14 MED ORDER — AMLODIPINE BESYLATE 10 MG PO TABS
10.0000 mg | ORAL_TABLET | Freq: Every day | ORAL | Status: DC
  Administered 2011-12-14 – 2011-12-18 (×5): 10 mg via ORAL
  Filled 2011-12-14 (×6): qty 1

## 2011-12-14 MED ORDER — LISINOPRIL 20 MG PO TABS
20.0000 mg | ORAL_TABLET | Freq: Every day | ORAL | Status: DC
  Administered 2011-12-14 – 2011-12-18 (×5): 20 mg via ORAL
  Filled 2011-12-14 (×6): qty 1

## 2011-12-14 MED ORDER — ENSURE PUDDING PO PUDG
1.0000 | Freq: Two times a day (BID) | ORAL | Status: DC
  Administered 2011-12-14 – 2011-12-17 (×5): 1 via ORAL

## 2011-12-14 NOTE — Procedures (Signed)
I was present at this session.  I have reviewed the session itself and made appropriate changes.  HD via cath. bp ^.  Signa Cheek L 9/23/20135:01 PM

## 2011-12-14 NOTE — Plan of Care (Signed)
Problem: RH PAIN MANAGEMENT Goal: RH STG PAIN MANAGED AT OR BELOW PT'S PAIN GOAL Pain less than 2  Outcome: Progressing No report of pain this shift.

## 2011-12-14 NOTE — Progress Notes (Signed)
Social Work  Social Work Assessment and Plan  Patient Details  Name: Heather Sims MRN: [id number] Date of Birth: 04-30-1990  Today's Date: 12/14/2011  Problem List:  Patient Active Problem List  Diagnosis  . Acute heart failure  . Hypoxemia  . Pneumonia  . Anemia due to blood loss, acute  . Hypertension  . ? Viral Cardiomyopathy  . Seizures disorder  . Anoxic brain damage  . Myoclonus  . Ileitis  . CKD (chronic kidney disease)- new chronic HD start this admit  . Hypoalbuminemia  . MPGN (membranoproliferative glomerulonephritis), type 2  . Acute renal failure  . Dehydration  . C. difficile colitis  . Seizure disorder, grand mal  . Encephalopathy acute  . Renal failure (ARF), acute on chronic  . Cardiomyopathy  . Acute respiratory failure  . Pulmonary edema  . Hemorrhagic shock, due to intraabdominal bleeding following paracentesis  . Hypervolemia  . Ascites   Past Medical History:  Past Medical History  Diagnosis Date  . Pneumonia     'walking pneumonia'  . Seizures   . Anemia   . Heart attack   . Cardiomyopathy   . Heart failure   . CHF (congestive heart failure)   . Ileitis   . Chronic kidney disease   . Renal disorder   . MPGN (membranoproliferative glomerulonephritis), type 2   . Hypertension   . Brain injury    Past Surgical History:  Past Surgical History  Procedure Date  . Renal biopsy    Social History:  reports that she has never smoked. She has never used smokeless tobacco. She reports that she does not drink alcohol or use illicit drugs.  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  Coloring in child's coloring book throughout my discussion with mother.  Offers occasional one - two word answers. Flat affect, however, no significatn emotional distress noted. Recent Psychosocial Issues: chronic issues since beginning of the year Pyschiatric History: none Substance Abuse History: none  Patient / Family Perceptions, Expectations & Goals Pt/Family understanding of illness & functional limitations: Mother with good understanding of pt's multiple medical issues - becoming very health literate over the past few months since pt was on CIR. Premorbid pt/family roles/activities: Mother  has been providing 24/7 care to patient since January 2013.  Pt really only out of the home for MD appointments Anticipated changes in roles/activities/participation: Little change anticipated as pt will continue to require 24/7 assistance upon d/c Pt/family expectations/goals: "I think she looks a thousand times better!Marland Kitchen..just happy with where she is"  [facility] Premorbid Home Care/DME Agencies: Other (Comment) Genevieve Norlander following with [facility] PTA) Transportation available at discharge: yes Resource referrals recommended: Support group (specify)  Discharge Planning Living Arrangements: Parent Support Systems: Parent Type of Residence: Private residence Insurance Resources: Medicaid (specify county) Medical sales representative) Surveyor, quantity Resources: SSI  Financial  Screen Referred: No Living Expenses: Lives with family Money Management: Family Do you have any problems obtaining your medications?: No Home Management: family Patient/Family Preliminary Plans: pt to return home with mother and father - mother to resume primary caregiver role Social Work Anticipated Follow Up Needs: HH/OP Expected length of stay: 2 weeks  Clinical Impression Unfortunate young woman familiar to CIR from two prior stays.  Here in deconditioned state, however, mother very pleased with gains made so far.  Mother fully prepared to resume 24/7 care of pt upon d/c.  Darrious Youman 12/14/2011, 3:44 PM

## 2011-12-14 NOTE — Progress Notes (Signed)
Nutrition Follow-up and Consult  Intervention:   1. RD provided diet education during this visit. 2. Pt is eating very well, consuming mostly 100% of meals per mom. 3. RD to continue to follow nutrition care plan  Assessment:   Per chart review, pt's intake has improved, consuming closer to 100% of meals. Megace and Ensure Complete discontinued. Pt now on renal restrictions and 1200 ml fluid restrictions. RD consulted for diet education. Provided Choose-A-Meal Booklet to patient/family. Reviewed food groups and provided written recommended serving sizes specifically determined for patient's current nutritional status.   Explained why diet restrictions are needed and provided lists of foods to limit/avoid that are high potassium, sodium, and phosphorus. Provided specific recommendations on safer alternatives of these foods. Strongly encouraged compliance of this diet.   Discussed importance of protein intake at each meal and snack. Provided examples of how to maximize protein intake throughout the day. Discussed need for fluid restriction with dialysis, importance of minimizing weight gain between HD treatments, and renal-friendly beverage options.  Encouraged pt to discuss specific diet questions/concerns with RD at HD outpatient facility.   Diet Order:  Renal 80 - 90 Supplement:  Ensure Pudding PO TID  Meds: Scheduled Meds:   . amLODipine  10 mg Oral QHS  . carvedilol  12.5 mg Oral BID WC  . clonazePAM  0.5 mg Oral BID  . feeding supplement  1 Container Oral BID BM  . levETIRAcetam  250 mg Oral BID  . lisinopril  20 mg Oral QHS  . multivitamin  1 tablet Oral QHS  . DISCONTD: amLODipine  10 mg Oral Daily  . DISCONTD: feeding supplement  237 mL Oral BID BM  . DISCONTD: lisinopril  20 mg Oral Daily  . DISCONTD: megestrol  400 mg Oral BID   Continuous Infusions:  PRN Meds:.acetaminophen, alum & mag hydroxide-simeth, bisacodyl, camphor-menthol, diphenhydrAMINE,  guaiFENesin-dextromethorphan, heparin, heparin, heparin, heparin, hydrOXYzine, lidocaine, lidocaine-prilocaine, methocarbamol, ondansetron (ZOFRAN) IV, ondansetron, pentafluoroprop-tetrafluoroeth, polyethylene glycol, prochlorperazine, prochlorperazine, prochlorperazine  Labs:  CMP     Component Value Date/Time   NA 134* 12/09/2011 0845   K 3.8 12/09/2011 0845   CL 100 12/09/2011 0845   CO2 29 12/09/2011 0845   GLUCOSE 81 12/09/2011 0845   BUN 16 12/09/2011 0845   CREATININE 2.36* 12/09/2011 0845   CREATININE 2.63* 10/12/2011 1230   CALCIUM 9.0 12/09/2011 0845   CALCIUM 8.4 10/28/2011 1327   PROT 6.1 11/29/2011 0224   ALBUMIN 1.9* 12/09/2011 0845   AST 56* 11/29/2011 0224   ALT 6 12/01/2011 2330   ALKPHOS 62 11/29/2011 0224   BILITOT 0.4 11/29/2011 0224   GFRNONAA 28* 12/09/2011 0845   GFRAA 33* 12/09/2011 0845   Sodium  Date/Time Value Range Status  12/09/2011  8:45 AM 134* 135 - 145 mEq/L Final  12/08/2011  5:40 AM 137  135 - 145 mEq/L Final  12/07/2011  4:33 AM 135  135 - 145 mEq/L Final    Potassium  Date/Time Value Range Status  12/09/2011  8:45 AM 3.8  3.5 - 5.1 mEq/L Final  12/08/2011  5:40 AM 3.6  3.5 - 5.1 mEq/L Final  12/07/2011  4:33 AM 3.8  3.5 - 5.1 mEq/L Final    Phosphorus  Date/Time Value Range Status  12/09/2011  8:45 AM 3.0  2.3 - 4.6 mg/dL Final  1/61/0960  4:54 AM 1.6* 2.3 - 4.6 mg/dL Final  0/98/1191  4:78 AM 2.6  2.3 - 4.6 mg/dL Final    Magnesium  Date/Time Value Range Status  12/01/2011  5:00 AM 2.0  1.5 - 2.5 mg/dL Final  07/26/2128  8:65 AM 1.9  1.5 - 2.5 mg/dL Final  09/28/4694  2:95 AM 1.6  1.5 - 2.5 mg/dL Final    Intake/Output Summary (Last 24 hours) at 12/14/11 1208 Last data filed at 12/14/11 0700  Gross per 24 hour  Intake   1080 ml  Output      5 ml  Net   1075 ml  BM: 9/22  Weight Status:  106 lb s/p HD 9/23 - trending up 98 lb s/p HD on 9/18  Estimated needs:  1650 -1900 kcal, 70 - 85 grams protein  Nutrition Dx:   1. Inadequate oral intake r/t  decreased attention and lethargy AEB very poor PO intake. Resolved. 2. Food- and nutrition-related knowledge deficit r/t limited prior diet education AEB new to HD.  Goal:  1. Pt to meet >/= 90% of their estimated nutrition needs - met 2. Verbalize basic understanding of renal diet - met.  Monitor:  weight trends, lab trends, I/O's, PO intake, supplement tolerance  DOCUMENTATION CODES  Per approved criteria   -Severe malnutrition in the context of chronic illness  -Underweight    Jarold Motto MS, RD, LDN Pager: [phone] After-hours pager: [phone]

## 2011-12-14 NOTE — Progress Notes (Signed)
Patient ID: Heather Sims, female   DOB: 1990-12-30, 21 y.o.   MRN: [id number] Subjective/Complaints: No problems in therapy yesterday  Objective: Vital Signs: Blood pressure 152/97, pulse 104, temperature 98.8 F (37.1 C), temperature source Oral, resp. rate 17, weight 48.4 kg (106 lb 11.2 oz), last menstrual period 11/30/2011, SpO2 94.00%. No results found. No results found for this basename: WBC:2,HGB:2,HCT:2,PLT:2 in the last 72 hours No results found for this basename: NA:2,K:2,CL:2,CO2:2,GLUCOSE:2,BUN:2,CREATININE:2,CALCIUM:2 in the last 72 hours CBG (last 3)  No results found for this basename: GLUCAP:3 in the last 72 hours  Wt Readings from Last 3 Encounters:  12/14/11 48.4 kg (106 lb 11.2 oz)  12/09/11 44.7 kg (98 lb 8.7 oz)  12/09/11 44.7 kg (98 lb 8.7 oz)    Physical Exam:  Constitutional: She appears well-developed.  Thin female.  HENT:  Head: Normocephalic.  Eyes: Pupils are equal, round, and reactive to light.  Cardiovascular: Regular rhythm. Tachycardia present.  Pulmonary/Chest: Effort normal. She has decreased breath sounds in the left lower field.  Abdominal: Soft. Bowel sounds are normal. She exhibits no distension. There is no tenderness.  Musculoskeletal: She exhibits no edema and no tenderness.  Spasticity BLE >BUE on testing. Hamstrings tight bilaterally Neurological: She is alert.  Follows basic commands. Speech simple and childlike but more spontaneous.  Resting tremors. Myoclonus in legs seems better with OT today.   Assessment/Plan: 1. Functional deficits secondary to deconditioning, hx of anoxic encephalopathy which require 3+ hours per day of interdisciplinary therapy in a comprehensive inpatient rehab setting. Physiatrist is providing close team supervision and 24 hour management of active medical problems listed below. Physiatrist and rehab team continue to assess barriers to discharge/monitor patient progress toward functional and medical  goals. FIM: FIM - Bathing Bathing Steps Patient Completed: Chest;Right Arm;Left Arm;Abdomen;Front perineal area;Buttocks;Right upper leg;Left lower leg (including foot);Right lower leg (including foot);Left upper leg Bathing: 4: Steadying assist  FIM - Upper Body Dressing/Undressing Upper body dressing/undressing steps patient completed: Thread/unthread right sleeve of pullover shirt/dresss;Thread/unthread left sleeve of pullover shirt/dress;Put head through opening of pull over shirt/dress;Pull shirt over trunk Upper body dressing/undressing: 4: Steadying assist FIM - Lower Body Dressing/Undressing Lower body dressing/undressing steps patient completed: Thread/unthread right pants leg;Thread/unthread left pants leg;Pull pants up/down;Pull underwear up/down;Thread/unthread left underwear leg;Thread/unthread right underwear leg;Don/Doff right sock;Don/Doff left sock Lower body dressing/undressing: 4: Steadying Assist  FIM - Toileting Toileting: 0: Activity did not occur  FIM - Archivist Transfers: 0-Activity did not occur  FIM - Games developer Transfer: 3: Bed > Chair or W/C: Mod A (lift or lower assist);3: Chair or W/C > Bed: Mod A (lift or lower assist)  FIM - Locomotion: Wheelchair Locomotion: Wheelchair: 1: Total Assistance/staff pushes wheelchair (Pt<25%) FIM - Locomotion: Ambulation Locomotion: Ambulation Assistive Devices: Designer, industrial/product Ambulation/Gait Assistance: 4: Min guard Locomotion: Ambulation: 2: Travels 50 - 149 ft with minimal assistance (Pt.>75%)  Comprehension Comprehension Mode: Auditory Comprehension: 5-Understands basic 90% of the time/requires cueing < 10% of the time  Expression Expression Mode: Verbal Expression: 3-Expresses basic 50 - 74% of the time/requires cueing 25 - 50% of the time. Needs to repeat parts of sentences.  Social Interaction Social Interaction: 4-Interacts appropriately 75 - 89% of the time - Needs  redirection for appropriate language or to initiate interaction.  Problem Solving Problem Solving: 3-Solves basic 50 - 74% of the time/requires cueing 25 - 49% of the time  Memory Memory: 3-Recognizes or recalls 50 - 74% of the time/requires cueing 25 -  49% of the time  Medical Problem List and Plan:  1. DVT Prophylaxis/Anticoagulation: Mechanical: Sequential compression devices, below knee Bilateral lower extremities  2. Pain Management: tylenol prn.  3. Mood: will require ego support and encouragement for participation. Patient lacks insight into her deficits.  4. Neuropsych: This patient is not capable of making decisions on his/her own behalf.  5. ESRD: Continue HD M-W-F. Daily weights. Strict I/O.  6. Anorexia: Continue megace bid. Offer ensure supplements bid to help with nutritional support. Continue low salt diet. No FR noted.  7. Seizures: Continue keppra bid 8. Myoclonus: resume low dose klonopin--this has helped 9. HTN: norvasc and lisinopril changed to hs per [facility]  LOS (Days) 5 A FACE TO FACE EVALUATION WAS PERFORMED  Omah Dewalt T 12/14/2011, 7:55 AM

## 2011-12-14 NOTE — Progress Notes (Signed)
Occupational Therapy Session Note  Patient Details  Name: Heather Sims MRN: [id number] Date of Birth: Aug 11, 1990  Today's Date: 12/14/2011 Time: 0730-0830 Time Calculation (min): 60 min  Short Term Goals: Week 1:  OT Short Term Goal 1 (Week 1): Pt would bathe 10/10 parts with min A OT Short Term Goal 2 (Week 1): Pt will don pants with min A sit to stand OT Short Term Goal 3 (Week 1): Pt will maintain standing position for pericare/ clothing management with min A with bilateral UE support OT Short Term Goal 4 (Week 1): Pt will perform scoot transfer with min A OT Short Term Goal 5 (Week 1): Pt will transfer stand pivot with mod A  Skilled Therapeutic Interventions/Progress Updates:    1:1 self care retraining at shower level in ADL apartment. Focus on transfers, sit to stand with recall of proper hand placement, standing balance without UE support to perform clothing management, tub bench transfer into shower, simple problem solving, and functional ambulation with RW. With first ambulation trial pt with flexed trunk posture and decreased hip and knee extension due to tightness in hamstring. After bathing perform therapeutic exercise in supine, long sitting, laying on stomach and sidelying to address hamstrings (to stretch )  Therapy Documentation Precautions:  Precautions Precautions: Fall Restrictions Weight Bearing Restrictions: No Pain:  soreness in hamstrings with stretching   See FIM for current functional status  Therapy/Group: Individual Therapy  Roney Mans [facility] 12/14/2011, 8:36 AM

## 2011-12-14 NOTE — Progress Notes (Signed)
Physical Therapy Note  Patient Details  Name: Heather Sims MRN: [id number] Date of Birth: 04/18/1990 Today's Date: 12/14/2011  Time In: 14:20 Time Out: 14:50.  Individual treatment. No c/o pain.  Treatment focused on bed mobility, basic self care skills, functional ambulation, static and dynamic standing balance, sit to stand, stand to sit, increasing activity tolerance, increasing basic attention and ability to stay on task, improving frustration tolerance.  Patient requires mod vc for encouragement to participate in therapies.  Mother present for session.   Norton Pastel 12/14/2011, 5:03 PM

## 2011-12-14 NOTE — Progress Notes (Signed)
Occupational Therapy Session Note  Patient Details  Name: Heather Sims MRN: [id number] Date of Birth: Jul 22, 1990  Today's Date: 12/14/2011 Time: 0865-7846 Time Calculation (min): 45 min  Short Term Goals: Week 1:  OT Short Term Goal 1 (Week 1): Pt would bathe 10/10 parts with min A OT Short Term Goal 2 (Week 1): Pt will don pants with min A sit to stand OT Short Term Goal 3 (Week 1): Pt will maintain standing position for pericare/ clothing management with min A with bilateral UE support OT Short Term Goal 4 (Week 1): Pt will perform scoot transfer with min A OT Short Term Goal 5 (Week 1): Pt will transfer stand pivot with mod A  Skilled Therapeutic Interventions/Progress Updates:    1:1 Therapeutic activity: Engaged in therapeutic game of Uno (pt's choice) in standing to address standing balance with and without UE support, sit to stand and stand to sit with control, activity tolerance. Then transitioned to the floor to play game in long sitting in a "V" position as tolerated, and figure 4 sit to address hip and hamstring tightness. Floor to standing position with max A.   Therapy Documentation Precautions:  Precautions Precautions: Fall Restrictions Weight Bearing Restrictions: No Pain:  no c/o pain    See FIM for current functional status  Therapy/Group: Individual Therapy  Roney Mans [facility] 12/14/2011, 4:36 PM

## 2011-12-14 NOTE — Progress Notes (Signed)
Speech Language Pathology Discharge Summary  Patient Details  Name: Heather Sims MRN: [id number] Date of Birth: 08-24-1990  Today's Date: 12/14/2011  Patient has met 4 of 4 long term goals.  Patient to discharge at [facility] level.   Reasons goals not met: All goals met at this time, pt with baseline cognitive deficits, suspect pt is close to cognitive baseline.    Clinical Impression/Discharge Summary: Pt has made functional gains and has met 4 out of 4 LTG's this admission. Currently, pt is overall Min A for cognitive function in regards to basic problem solving, working memory, emergent awareness, attention and expression of wants/needs. Suspect pt is close to cognitive baseline and due to pt's admission diagnosis will defer treatment of current cognitive deficits in treatment to OT at this time. Skilled intensive SLP treatment is not warranted at this time, however, recommend home health SLP f/u to maximize overall cognitive function and independence.    Care Partner:  Caregiver Able to Provide Assistance: Yes  Type of Caregiver Assistance: Cognitive  Recommendation:  24 hour supervision/assistance;Home Health SLP  Rationale for SLP Follow Up: Maximize cognitive function and independence   Equipment: N/A   Reasons for discharge: Treatment goals met   Patient/Family Agrees with Progress Made and Goals Achieved: Yes   See FIM for current functional status  Emmett Arntz 12/14/2011, 12:14 PM

## 2011-12-14 NOTE — Progress Notes (Signed)
Subjective: Interval History: none.  Objective: Vital signs in last 24 hours: Temp:  [98.7 F (37.1 C)-98.8 F (37.1 C)] 98.8 F (37.1 C) (09/23 0520) Pulse Rate:  [99-104] 104  (09/23 0520) Resp:  [17-18] 17  (09/23 0520) BP: (145-164)/(91-98) 152/97 mmHg (09/23 0520) SpO2:  [94 %-98 %] 94 % (09/23 0520) Weight:  [48.4 kg (106 lb 11.2 oz)] 48.4 kg (106 lb 11.2 oz) (09/23 0520) Weight change: 2.5 kg (5 lb 8.2 oz)  Intake/Output from previous day: 09/22 0701 - 09/23 0700 In: 840 [P.O.:840] Out: 5 [Urine:4; Stool:1] Intake/Output this shift:    General appearance: cooperative and fatigued Resp: clear to auscultation bilaterally Chest wall: no tenderness, R IJ cath Cardio: S1, S2 normal and systolic murmur: holosystolic 3/6, blowing at apex GI: abdm pos bs, soft, liver down 5 cm  Lab Results: No results found for this basename: WBC:2,HGB:2,HCT:2,PLT:2 in the last 72 hours BMET: No results found for this basename: NA:2,K:2,CL:2,CO2:2,GLUCOSE:2,BUN:2,CREATININE:2,CALCIUM:2 in the last 72 hours No results found for this basename: PTH:2 in the last 72 hours Iron Studies: No results found for this basename: IRON,TIBC,TRANSFERRIN,FERRITIN in the last 72 hours  Studies/Results: No results found.  I have reviewed the patient's current medications.  Assessment/Plan: 1 CRF for HD today.  Get vol down and lower bp. Needs perm access 2 Access 3 Anemia not an issue 4 HTN change to hs meds 5 Sz 6 Anoxic enceph stable4 P HD, change bp meds, get access scheduled.    LOS: 5 days   Maegen Wigle L 12/14/2011,7:10 AM

## 2011-12-14 NOTE — Progress Notes (Signed)
Physical Therapy Session Note  Patient Details  Name: ESMERALDA VONNAHME MRN: [id number] Date of Birth: [birth year]  Today's Date: 12/14/2011 Time: 3086-5784 Time Calculation (min): 53 min  Short Term Goals: Week 1:  PT Short Term Goal 1 (Week 1): Pt will perform functional transfers with mod A PT Short Term Goal 2 (Week 1): Pt will gait with max A 25' in controlled environment PT Short Term Goal 3 (Week 1): Pt will demo intellectual awareness in functional activity with min A  Skilled Therapeutic Interventions/Progress Updates:    See below for details.  Therapy Documentation Precautions:  Precautions Precautions: Fall Restrictions Weight Bearing Restrictions: No Pain:  No pain Mobility:  Sit to stand with min@, to occasional mod@. Locomotion :  Gait with RW x 60' with min@, pt with knee flexion during the whole gait cycle.  Pt initiated doing steps, initial step pt with LOB, max@ to regain, then performed 5 steps with 2 rails with mod@.  Performed steps a second time with pt overshooting the last step and needing total @ to regain balance. Balance:  Wii Just Dance for dynamic balance, pt performed almost a whole song in standing with RW with min@, before sitting unexpectedly due to fatigue.  Performed two short versions with min@ after the first one Exercises:  Performed Wii Just Dance in sitting for UE exercise after being too fatigued to stand and perform.  See FIM for current functional status  Therapy/Group: Individual Therapy  Georges Mouse 12/14/2011, 11:19 AM

## 2011-12-14 NOTE — Significant Event (Signed)
CRITICAL VALUE ALERT  Critical value received:  6.5 Potassium  Date of notification:  23 Sept 2013  Time of notification:  1858  Critical value read back:yes  Nurse who received alert:  Laural Roes RN  MD notified (1st page):  Deatra Ina, PA   Time of first page:  1859  MD notified (2nd page):  Time of second page:  Responding MD:    Time MD responded:

## 2011-12-15 ENCOUNTER — Inpatient Hospital Stay (HOSPITAL_COMMUNITY): Payer: Medicaid Other | Admitting: Occupational Therapy

## 2011-12-15 ENCOUNTER — Inpatient Hospital Stay (HOSPITAL_COMMUNITY): Payer: Medicaid Other | Admitting: Physical Therapy

## 2011-12-15 ENCOUNTER — Other Ambulatory Visit: Payer: Self-pay | Admitting: *Deleted

## 2011-12-15 LAB — RENAL FUNCTION PANEL
BUN: 18 mg/dL (ref 6–23)
CO2: 26 mEq/L (ref 19–32)
Calcium: 9.2 mg/dL (ref 8.4–10.5)
GFR calc Af Amer: 46 mL/min — ABNORMAL LOW (ref >90)
Glucose, Bld: 86 mg/dL (ref 70–99)
Phosphorus: 3 mg/dL (ref 2.3–4.6)
Potassium: 4.4 mEq/L (ref 3.5–5.1)
Sodium: 133 mEq/L — ABNORMAL LOW (ref 135–145)

## 2011-12-15 MED ORDER — HEPARIN SODIUM (PORCINE) 1000 UNIT/ML DIALYSIS
100.0000 [IU]/kg | INTRAMUSCULAR | Status: DC | PRN
  Filled 2011-12-15: qty 5

## 2011-12-15 NOTE — Progress Notes (Signed)
VVS  Spoke with Mom and Patient. Discussed up-coming AVF vs Graft. Vein mapping shows small veins throughout.   Mom requests we wait till Friday which works well with our schedule.  We will post for left arm AVF vs. Graft for Friday

## 2011-12-15 NOTE — Progress Notes (Signed)
Occupational Therapy Session Note  Patient Details  Name: Heather Sims MRN: [id number] Date of Birth: 06-27-1990  Today's Date: 12/15/2011 Time: 6295-2841 Time Calculation (min): 45 min  Short Term Goals: Week 1:  OT Short Term Goal 1 (Week 1): Pt would bathe 10/10 parts with min A OT Short Term Goal 2 (Week 1): Pt will don pants with min A sit to stand OT Short Term Goal 3 (Week 1): Pt will maintain standing position for pericare/ clothing management with min A with bilateral UE support OT Short Term Goal 4 (Week 1): Pt will perform scoot transfer with min A OT Short Term Goal 5 (Week 1): Pt will transfer stand pivot with mod A  Skilled Therapeutic Interventions/Progress Updates:    Therapeutic activity: Addressing functional ambulation with increasing distance for activity tolerance, functional dynamic standing balance while engaging in "Just Dance" on the Wii, short functional ambulation without RW however pt with increased fear presenting with increased tremors in bilateral LEs. On the mat in supine pt able to achieve increased knee extension with basic LE exercises, bridging with ball between knees to address hip strength 12x and sit to stand without UE support holding a ball with min A due to fear x5 to improve normal patterns of movement with functional activity.   Therapy Documentation Precautions:  Precautions Precautions: Fall Restrictions Weight Bearing Restrictions: No Pain:  no c/o pain  See FIM for current functional status  Therapy/Group: Individual Therapy  Roney Mans [facility] 12/15/2011, 4:23 PM

## 2011-12-15 NOTE — Patient Care Conference (Signed)
Inpatient RehabilitationTeam Conference Note Date: 12/15/2011   Time: 3:00 PM    Patient Name: Heather Sims      Medical Record Number: [id number]  Date of Birth: 1991-02-25 Sex: Female         Room/Bed: 4004/4004-01 Payor Info: Payor: MEDICAID   Plan: MEDICAID [place] ACCESS  Product Type: *No Product type*     Admitting Diagnosis: Encephalopathy,ESRD  Admit Date/Time:  12/09/2011  6:31 PM Admission Comments: No comment available   Primary Diagnosis:  Encephalopathy acute Principal Problem: Encephalopathy acute  Patient Active Problem List   Diagnosis Date Noted  . Pulmonary edema 11/29/2011  . Hemorrhagic shock, due to intraabdominal bleeding following paracentesis 11/29/2011  . Hypervolemia 11/29/2011  . Ascites 11/29/2011  . Acute respiratory failure 11/28/2011  . Encephalopathy acute 11/26/2011  . Renal failure (ARF), acute on chronic 11/26/2011  . Cardiomyopathy 11/26/2011  . Acute renal failure 09/11/2011  . Dehydration 09/11/2011  . C. difficile colitis 09/11/2011  . Seizure disorder, grand mal 09/11/2011  . MPGN (membranoproliferative glomerulonephritis), type 2   . Ileitis 08/24/2011  . CKD (chronic kidney disease)- new chronic HD start this admit 08/24/2011  . Hypoalbuminemia 08/24/2011  . Myoclonus 07/03/2011  . Anoxic brain damage 05/12/2011  . Seizures disorder 05/06/2011  . Hypertension 04/07/2011  . ? Viral Cardiomyopathy 04/07/2011  . Acute heart failure 03/26/2011  . Hypoxemia 03/26/2011  . Pneumonia 03/26/2011  . Anemia due to blood loss, acute 03/26/2011    Expected Discharge Date: Expected Discharge Date: 12/17/11  Team Members Present: Physician: Dr. Faith Rogue Social Worker Present: Amada Jupiter, LCSW Nurse Present: Daryll Brod, RN PT Present: Reggy Eye, PT OT Present: Mackie Pai, Marye Round, OT;Ardis Rowan, COTA SLP Present: Feliberto Gottron, SLP Other (Discipline and Name): Tora Duck, PPS Coordinator   Current Status/Progress Goal Weekly Team Focus  Medical   encephlopathy chronic with acute deconditioning after renal failure, multiple medical  myoclonus mgt, increase ROM at knees, maintain FEN balance  see above   Bowel/Bladder   HD Mon, Wed, Fri continent of bowel, LBM 12/13/11  Min assist  Monitor   Swallow/Nutrition/ Hydration             ADL's   min A overall  min A overall  functional ambulation, transfers, stretching    Mobility   min-mod A  min A  activity tolerance, balance   Communication             Safety/Cognition/ Behavioral Observations            Pain   No c/o pain  < = 3  Monitor for nonverbal pain indicators   Skin   skin itching do to dryness  no skin breakdown  Assess skin q shift.    Rehab Goals Patient on target to meet rehab goals: Yes *See Interdisciplinary Assessment and Plan and progress notes for long and short-term goals  Barriers to Discharge: generalized weakness, cognitive deficits    Possible Resolutions to Barriers:  family ed, continued therapy as outlined    Discharge Planning/Teaching Needs:  home with mother who will resume 24/7 support      Team Discussion:  Making excellent progress and anticipate ready for d/c 9/26.  No concerns to d/c.  Revisions to Treatment Plan: None     Continued Need for Acute Rehabilitation Level of Care: The patient requires daily medical management by a physician with specialized training in physical medicine and rehabilitation for the following conditions: Daily direction of a multidisciplinary physical  rehabilitation program to ensure safe treatment while eliciting the highest outcome that is of practical value to the patient.: Yes Daily medical management of patient stability for increased activity during participation in an intensive rehabilitation regime.: Yes Daily analysis of laboratory values and/or radiology reports with any subsequent need for medication adjustment of medical intervention  for : Neurological problems;Other;Cardiac problems  Heather Sims 12/15/2011, 3:35 PM

## 2011-12-15 NOTE — Progress Notes (Signed)
Occupational Therapy Session Note  Patient Details  Name: Heather Sims MRN: [id number] Date of Birth: 1990/07/27  Today's Date: 12/15/2011 Time: 4098-1191 Time Calculation (min): 27 min   Skilled Therapeutic Interventions/Progress Updates: Treatment session focused on functional ambulation, safety, static and dynamic standing balance, toilet transfers, toileting, sustained attention, following multi step directions.  Patient making significant daily progress.     Therapy Documentation Precautions:  Precautions Precautions: Fall Restrictions Weight Bearing Restrictions: No General:   Vital Signs:   Pain: No c/o of pain   ADL:   Exercises:   Other Treatments:    See FIM for current functional status  Therapy/Group: Individual Therapy  Norton Pastel 12/15/2011, 3:52 PM

## 2011-12-15 NOTE — Progress Notes (Addendum)
Recreational Therapy Session Note  Patient Details  Name: Heather Sims MRN: [id number] Date of Birth: 11-30-1990 Today's Date: 12/15/2011 Time:  1410-1430 Pain: no c/o Skilled Therapeutic Interventions/Progress Updates: Session focused on generating questions to ask Tree surgeon at [employer] during tomorrow's scheduled outing.  Pt listed 6 appropriate questions to ask with Mod I  Darrell Hauk 12/15/2011, 3:46 PM

## 2011-12-15 NOTE — Progress Notes (Signed)
Patient ID: Heather Sims, female   DOB: [birth year], 21 y.o.   MRN: [id number] Subjective/Complaints: Complains of headache over eyes this am. Up with OT already  Objective: Vital Signs: Blood pressure 132/88, pulse 93, temperature 98.3 F (36.8 C), temperature source Oral, resp. rate 19, weight 45.7 kg (100 lb 12 oz), last menstrual period 11/30/2011, SpO2 66.00%. No results found.  Basename 12/14/11 1732  WBC 6.3  HGB 11.1*  HCT 34.9*  PLT 230    Basename 12/15/11 0600 12/14/11 2029 12/14/11 1732  NA 133* -- 138  K 4.4 3.5 --  CL 97 -- 103  CO2 26 -- 29  GLUCOSE 86 -- 90  BUN 18 -- 43*  CREATININE 1.76* -- 2.39*  CALCIUM 9.2 -- 9.6   CBG (last 3)  No results found for this basename: GLUCAP:3 in the last 72 hours  Wt Readings from Last 3 Encounters:  12/15/11 45.7 kg (100 lb 12 oz)  12/09/11 44.7 kg (98 lb 8.7 oz)  12/09/11 44.7 kg (98 lb 8.7 oz)    Physical Exam:  Constitutional: She appears well-developed.  Thin female.  HENT:  Head: Normocephalic.  Eyes: Pupils are equal, round, and reactive to light.  Cardiovascular: Regular rhythm. Tachycardia present.  Pulmonary/Chest: Effort normal. She has decreased breath sounds in the left lower field.  Abdominal: Soft. Bowel sounds are normal. She exhibits no distension. There is no tenderness.  Musculoskeletal: She exhibits no edema and no tenderness.  Spasticity BLE >BUE on testing. Hamstrings tight bilaterally. Doesn't allow stretching past-40 degrees this am. Neurological: She is alert.  Follows basic commands. Speech simple and childlike but more spontaneous.  Resting tremors. Myoclonus in legs seems better with OT today.   Assessment/Plan: 1. Functional deficits secondary to deconditioning, hx of anoxic encephalopathy which require 3+ hours per day of interdisciplinary therapy in a comprehensive inpatient rehab setting. Physiatrist is providing close team supervision and 24 hour management of active medical  problems listed below. Physiatrist and rehab team continue to assess barriers to discharge/monitor patient progress toward functional and medical goals. FIM: FIM - Bathing Bathing Steps Patient Completed: Chest;Right Arm;Left Arm;Abdomen;Front perineal area;Buttocks;Right upper leg;Left lower leg (including foot);Right lower leg (including foot);Left upper leg Bathing: 5: Set-up assist to: Adjust water temp  FIM - Upper Body Dressing/Undressing Upper body dressing/undressing steps patient completed: Thread/unthread right sleeve of pullover shirt/dresss;Thread/unthread left sleeve of pullover shirt/dress;Put head through opening of pull over shirt/dress;Pull shirt over trunk Upper body dressing/undressing: 5: Set-up assist to: Obtain clothing/put away FIM - Lower Body Dressing/Undressing Lower body dressing/undressing steps patient completed: Thread/unthread right pants leg;Thread/unthread left pants leg;Pull pants up/down;Pull underwear up/down;Thread/unthread left underwear leg;Thread/unthread right underwear leg;Don/Doff right sock;Don/Doff left sock;Don/Doff right shoe;Don/Doff left shoe;Fasten/unfasten right shoe;Fasten/unfasten left shoe Lower body dressing/undressing: 4: Steadying Assist  FIM - Toileting Toileting: 0: Activity did not occur  FIM - Archivist Transfers: 0-Activity did not occur  FIM - Games developer Transfer: 5: Supine > Sit: Supervision (verbal cues/safety issues);4: Bed > Chair or W/C: Min A (steadying Pt. > 75%)  FIM - Locomotion: Wheelchair Locomotion: Wheelchair: 1: Total Assistance/staff pushes wheelchair (Pt<25%) FIM - Locomotion: Ambulation Locomotion: Ambulation Assistive Devices: Designer, industrial/product Ambulation/Gait Assistance: 4: Min guard Locomotion: Ambulation: 2: Travels 50 - 149 ft with minimal assistance (Pt.>75%)  Comprehension Comprehension Mode: Auditory Comprehension: 6-Follows complex conversation/direction: With extra  time/assistive device  Expression Expression Mode: Verbal Expression: 5-Expresses basic needs/ideas: With no assist  Social Interaction Social Interaction: 4-Interacts appropriately 75 -  89% of the time - Needs redirection for appropriate language or to initiate interaction.  Problem Solving Problem Solving: 5-Solves complex 90% of the time/cues < 10% of the time  Memory Memory: 4-Recognizes or recalls 75 - 89% of the time/requires cueing 10 - 24% of the time  Medical Problem List and Plan:  1. DVT Prophylaxis/Anticoagulation: Mechanical: Sequential compression devices, below knee Bilateral lower extremities  2. Pain Management: tylenol prn. Should be effective for headaches 3. Mood: will require ego support and encouragement for participation. Patient lacks insight into her deficits.  4. Neuropsych: This patient is not capable of making decisions on his/her own behalf.  5. ESRD: Continue HD M-W-F. Daily weights. Strict I/O.  6. Anorexia: Continue megace bid. Offer ensure supplements bid to help with nutritional support. Continue low salt diet. No FR noted.  7. Seizures: Continue keppra bid 8. Myoclonus: resume low dose klonopin--this has helped  -therapy and staff are stretching her legs  -i discussed with the patient that she needs to stretch her legs also 9. HTN: norvasc and lisinopril changed to hs per [facility]  LOS (Days) 6 A FACE TO FACE EVALUATION WAS PERFORMED  SWARTZ,ZACHARY T 12/15/2011, 7:45 AM

## 2011-12-15 NOTE — Progress Notes (Signed)
Occupational Therapy Session Note  Patient Details  Name: Heather Sims MRN: [id number] Date of Birth: 04-11-1990  Today's Date: 12/15/2011 Time: 0730-0812 Time Calculation (min): 42 min  Short Term Goals: Week 1:  OT Short Term Goal 1 (Week 1): Pt would bathe 10/10 parts with min A OT Short Term Goal 2 (Week 1): Pt will don pants with min A sit to stand OT Short Term Goal 3 (Week 1): Pt will maintain standing position for pericare/ clothing management with min A with bilateral UE support OT Short Term Goal 4 (Week 1): Pt will perform scoot transfer with min A OT Short Term Goal 5 (Week 1): Pt will transfer stand pivot with mod A  Skilled Therapeutic Interventions/Progress Updates:    1:1 self care retraining at sink level (pt's choice) to complete bathing and dressing. Focus on functional ambulation around room including bathroom and to RN station and back with RW with steady A to close supervision, sit to stands, standing balance statically and dynamically, sequencing and simple problem solving. With first stand of the morning pt with very flexed posture and unable to extend knees with more standing and ambulation improved ability to extend knees (however not fully!) Team plan for mother to stretch patient's LEs from home exercise program.  Therapy Documentation Precautions:  Precautions Precautions: Fall Restrictions Weight Bearing Restrictions: No Pain:  no c/o pain  See FIM for current functional status  Therapy/Group: Individual Therapy  Roney Mans [facility] 12/15/2011, 8:22 AM

## 2011-12-15 NOTE — Progress Notes (Signed)
Physical Therapy Note  Patient Details  Name: Heather Sims MRN: [id number] Date of Birth: [birth year] Today's Date: 12/15/2011  Time: 900-954 54 minutes  No c/o pain.  Gait with RW controlled and household environments with supervision, close supervision in kitchen for safety with opening cabinets, refridgerator, cues for safety.  Kitchen mobility and cognitive task of making a grocery list with min-mod questioning cues for following directions and safety awareness.  Improved standing tolerance > 5 minutes before needing seated rest.  RW mobility in gift shop with close supervision, cues for safety and attention to obstacles, pt with decreased safety awareness when fatigued.  Stair training with min A 2 x 5 stairs with B handrails.  Pt with much improved activity tolerance and gait with RW today, continues to requiring cuing for safety with all tasks.  Individual therapy   Jonanthan Bolender 12/15/2011, 9:55 AM

## 2011-12-15 NOTE — Progress Notes (Signed)
Recreational Therapy Session Note  Patient Details  Name: TONIETTE STIGER MRN: [id number] Date of Birth: September 10, 1990 Today's Date: 12/15/2011  Pain: no c/o Skilled Therapeutic Interventions/Progress Updates:  Time:  9-955  Ambulated with RW from room to kitchen with close supervision, min cues for encouragement.  Pt utilized a pre-printed grocery list to determine what items were needed with mod questioning cues while ambulating around to look in cabinets.  Discussed leisure interests and ways to identify what services are offered in community.  Also discussed community reintegration/outing to an agency that provides therapeutic horseback riding.  Pt excited and agreeable, also discussed with mom who is agreeable.  Therapy/Group: Co-Treatment  Time:01-1144 Assisted pt with locating information about community based programs that provide therapeutic horseback riding using computer while seated.  Pt compared 2 programs and identified which one would best meet her needs with min cues.    Jaxx Huish 12/15/2011, 12:20 PM

## 2011-12-15 NOTE — Progress Notes (Signed)
Subjective: Interval History: none.  Objective: Vital signs in last 24 hours: Temp:  [97.5 F (36.4 C)-98.8 F (37.1 C)] 98.3 F (36.8 C) (09/24 0555) Pulse Rate:  [93-112] 93  (09/24 0555) Resp:  [14-22] 19  (09/24 0555) BP: (126-182)/(73-123) 132/88 mmHg (09/24 0555) SpO2:  [66 %-99 %] 66 % (09/24 0555) Weight:  [45.5 kg (100 lb 5 oz)-45.7 kg (100 lb 12 oz)] 45.7 kg (100 lb 12 oz) (09/24 0555) Weight change: -2.9 kg (-6 lb 6.3 oz)  Intake/Output from previous day: 09/23 0701 - 09/24 0700 In: 240 [P.O.:240] Out: 4168 [Urine:1] Intake/Output this shift: Total I/O In: 240 [P.O.:240] Out: -   General appearance: alert and slowed mentation Resp: clear to auscultation bilaterally Chest wall: no tenderness, RIJ PC Breasts: normal appearance, no masses or tenderness GI: soft pos bs,liver down 4 cm CV reg gr2/6 M, LV lift  Lab Results:  [facility] 12/14/11 1732  WBC 6.3  HGB 11.1*  HCT 34.9*  PLT 230   BMET:  Basename 12/15/11 0600 12/14/11 2029 12/14/11 1732  NA 133* -- 138  K 4.4 3.5 --  CL 97 -- 103  CO2 26 -- 29  GLUCOSE 86 -- 90  BUN 18 -- 43*  CREATININE 1.76* -- 2.39*  CALCIUM 9.2 -- 9.6   No results found for this basename: PTH:2 in the last 72 hours Iron Studies: No results found for this basename: IRON,TIBC,TRANSFERRIN,FERRITIN in the last 72 hours  Studies/Results: No results found.  I have reviewed the patient's current medications.  Assessment/Plan: 1 ESRD better vol cont to lower.  Will get perm access on Fri 2 Anemia stable 3 HTN better with  Vol control and hs meds 4 HPTH 5 Encephalopathy per primary  P HD, access, lower vol antiHTN, epo    LOS: 6 days   Mardene Lessig L 12/15/2011,12:37 PM

## 2011-12-16 ENCOUNTER — Inpatient Hospital Stay (HOSPITAL_COMMUNITY): Payer: Medicaid Other | Admitting: Occupational Therapy

## 2011-12-16 ENCOUNTER — Inpatient Hospital Stay (HOSPITAL_COMMUNITY): Payer: Medicaid Other | Admitting: *Deleted

## 2011-12-16 ENCOUNTER — Inpatient Hospital Stay (HOSPITAL_COMMUNITY): Payer: Medicaid Other | Admitting: Physical Therapy

## 2011-12-16 DIAGNOSIS — G931 Anoxic brain damage, not elsewhere classified: Secondary | ICD-10-CM

## 2011-12-16 DIAGNOSIS — Z5189 Encounter for other specified aftercare: Secondary | ICD-10-CM

## 2011-12-16 DIAGNOSIS — R5381 Other malaise: Secondary | ICD-10-CM

## 2011-12-16 DIAGNOSIS — N186 End stage renal disease: Secondary | ICD-10-CM

## 2011-12-16 NOTE — Progress Notes (Signed)
Occupational Therapy Discharge Summary  Patient Details  Name: BLONDELL LAPERLE MRN: [id number] Date of Birth: [birth year]  Today's Date: 12/16/2011 Time: 0730-0830 Time Calculation (min): 60 min  Patient has met 12 of 12 long term goals due to improved activity tolerance, improved balance, postural control, functional use of  RIGHT upper, RIGHT lower, LEFT upper and LEFT lower extremity, improved attention, improved awareness and improved coordination.  Patient to discharge at overall Supervision level with occasional steadying A with dynamic balance tasks without UE support.  Patient's care partner is independent to provide the necessary physical and cognitive assistance at discharge.  Plan is for pt to receive HD graft on Friday and d/c home with family.  Reasons goals not met: n/a  Recommendation:  Patient will benefit from ongoing skilled OT services in outpatient setting to continue to advance functional skills in the area of BADL and iADL.  Equipment: No equipment provided  Reasons for discharge: treatment goals met and discharge from hospital  Patient/family agrees with progress made and goals achieved: Yes  OT Discharge Precautions/Restrictions  Precautions Precautions: Fall    Vital Signs Therapy Vitals Temp: 98.8 F (37.1 C) Temp src: Oral Pulse Rate: 98  Resp: 19  BP: 136/96 mmHg Patient Position, if appropriate: Lying Oxygen Therapy SpO2: 98 % O2 Device: None (Room air) Pain  no c/o pain ADL ADL Eating: Supervision/safety Grooming: Supervision/safety Upper Body Bathing: Supervision/safety Where Assessed-Upper Body Bathing: Shower Lower Body Bathing: Supervision/safety Where Assessed-Lower Body Bathing: Shower (sitting) Upper Body Dressing: Supervision/safety Lower Body Dressing: Supervision/safety;Contact guard Where Assessed-Lower Body Dressing: Chair Toileting: Supervision/safety Toilet Transfer: Close supervision Toilet Transfer Method:  Ambulating Tub/Shower Transfer: Scientific laboratory technician Method: Ambulating;Stand pivot Public house manager: Emergency planning/management officer Vision/Perception  Vision - History Baseline Vision: No visual deficits Patient Visual Report: No change from baseline Vision - Assessment Eye Alignment: Within Functional Limits Perception Perception: Within Functional Limits Praxis Praxis: Intact  Cognition Overall Cognitive Status: Impaired at baseline Arousal/Alertness: Awake/alert Orientation Level: Oriented X4 Attention: Selective Focused Attention: Appears intact Sustained Attention: Appears intact Selective Attention: Appears intact Memory: Impaired Memory Impairment: Decreased long term memory;Decreased recall of new information;Decreased short term memory Decreased Long Term Memory: Verbal basic;Functional basic Decreased Short Term Memory: Verbal basic;Functional basic Awareness: Impaired Awareness Impairment: Anticipatory impairment;Emergent impairment Problem Solving: Impaired Problem Solving Impairment: Verbal basic;Functional basic Executive Function: Reasoning;Sequencing;Organizing;Decision Making Reasoning: Impaired Reasoning Impairment: Verbal basic;Functional basic Sequencing: Impaired Sequencing Impairment: Verbal basic;Functional basic Organizing: Impaired Organizing Impairment: Verbal basic;Functional basic Decision Making: Impaired Decision Making Impairment: Verbal basic;Functional basic Initiating: Impaired Initiating Impairment: Verbal basic;Functional basic Self Monitoring: Impaired Self Monitoring Impairment: Verbal basic;Functional basic Self Correcting: Impaired Self Correcting Impairment: Verbal basic;Functional basic Sensation Sensation Light Touch: Impaired Detail Light Touch Impaired Details: Impaired RLE;Impaired LLE Hot/Cold: Appears Intact Proprioception: Impaired Detail Proprioception Impaired Details: Impaired LLE;Impaired  RLE Coordination Gross Motor Movements are Fluid and Coordinated: No Fine Motor Movements are Fluid and Coordinated: No Coordination and Movement Description: pt still with ataxic movement in bilateral LEs, myoclonic movements Motor  Motor Motor: Ataxia;Abnormal postural alignment and control;Motor impersistence;Abnormal tone Mobility  Bed Mobility Supine to Sit: 5: Supervision Sitting - Scoot to Edge of Bed: 5: Supervision Sit to Supine: 5: Supervision Transfers Sit to Stand: 5: Supervision Stand to Sit: 5: Supervision  Trunk/Postural Assessment  Cervical Assessment Cervical Assessment: Within Functional Limits Thoracic Assessment Thoracic Assessment: Within Functional Limits Lumbar Assessment Lumbar Assessment:  (posterior tilt due to abdominal swelling) Postural Control Postural Control:  (decreased trunk control  and strength)  Balance Balance Balance Assessed: Yes Static Sitting Balance Static Sitting - Balance Support: No upper extremity supported Static Sitting - Level of Assistance: 5: Stand by assistance Dynamic Sitting Balance Dynamic Sitting - Balance Support: During functional activity Dynamic Sitting - Level of Assistance: 5: Stand by assistance Static Standing Balance Static Standing - Level of Assistance: 5: Stand by assistance Extremity/Trunk Assessment RUE Assessment RUE Assessment: Within Functional Limits LUE Assessment LUE Assessment: Within Functional Limits  See FIM for current functional status  Roney Mans [facility] 12/16/2011, 8:53 AM

## 2011-12-16 NOTE — Progress Notes (Signed)
Recreational Therapy Session Note  Patient Details  Name: Heather Sims MRN: [id number] Date of Birth: [birth year] Today's Date: 12/16/2011 Time:  1030-1215 Pain: no c/o Skilled Therapeutic Interventions/Progress Updates: Pt participated in community reintegration/outing to [facility] to identify if their programming met her needs and interests.  Pt ambulated up and down van steps with Min assist  x1, and Mod A x1 for decent.  Pt ambulated with hand held assist on uneven outdoor surfaces including grass, gravel, and rubber flooring with Min assist.  Pt utilized compensatory strategy of using the list of questions that she came up with in yesterdays session during site visit with independence.  Pt initiated conversation with program staff and wrote down staff responses to questions with mod I.  Pt smiling throughout outing and stated plans to participate in program post discharge if financially able.  Therapy/Group: [employer]   Sevastian Witczak 12/16/2011, 5:03 PM

## 2011-12-16 NOTE — Progress Notes (Signed)
Patient ID: Heather Sims, female   DOB: 13-Jul-1990, 21 y.o.   MRN: [id number] Subjective/Complaints: Walking in the hall with therapy. No complaints today  Objective: Vital Signs: Blood pressure 136/96, pulse 98, temperature 98.8 F (37.1 C), temperature source Oral, resp. rate 19, weight 46.7 kg (102 lb 15.3 oz), last menstrual period 11/30/2011, SpO2 98.00%. No results found.  Basename 12/14/11 1732  WBC 6.3  HGB 11.1*  HCT 34.9*  PLT 230    Basename 12/15/11 0600 12/14/11 2029 12/14/11 1732  NA 133* -- 138  K 4.4 3.5 --  CL 97 -- 103  CO2 26 -- 29  GLUCOSE 86 -- 90  BUN 18 -- 43*  CREATININE 1.76* -- 2.39*  CALCIUM 9.2 -- 9.6   CBG (last 3)  No results found for this basename: GLUCAP:3 in the last 72 hours  Wt Readings from Last 3 Encounters:  12/16/11 46.7 kg (102 lb 15.3 oz)  12/09/11 44.7 kg (98 lb 8.7 oz)  12/09/11 44.7 kg (98 lb 8.7 oz)    Physical Exam:  Constitutional: She appears well-developed.  Thin female.  HENT:  Head: Normocephalic.  Eyes: Pupils are equal, round, and reactive to light.  Cardiovascular: Regular rhythm. Tachycardia present.  Pulmonary/Chest: Effort normal. She has decreased breath sounds in the left lower field.  Abdominal: Soft. Bowel sounds are normal. She exhibits no distension. There is no tenderness.  Musculoskeletal: She exhibits no edema and no tenderness.  Spasticity BLE >BUE on testing. Hamstrings tight bilaterally. Doesn't allow stretching past-40 degrees this am. Neurological: She is alert.  Follows basic commands. Speech simple and childlike but more spontaneous.  Resting tremors. Myoclonus in legs seems better with OT today.   Assessment/Plan: 1. Functional deficits secondary to deconditioning, hx of anoxic encephalopathy which require 3+ hours per day of interdisciplinary therapy in a comprehensive inpatient rehab setting. Physiatrist is providing close team supervision and 24 hour management of active medical  problems listed below. Physiatrist and rehab team continue to assess barriers to discharge/monitor patient progress toward functional and medical goals. FIM: FIM - Bathing Bathing Steps Patient Completed: Chest;Right Arm;Left Arm;Abdomen;Front perineal area;Buttocks;Right upper leg;Left lower leg (including foot);Right lower leg (including foot);Left upper leg Bathing: 5: Set-up assist to: Adjust water temp  FIM - Upper Body Dressing/Undressing Upper body dressing/undressing steps patient completed: Thread/unthread right sleeve of pullover shirt/dresss;Thread/unthread left sleeve of pullover shirt/dress;Put head through opening of pull over shirt/dress;Pull shirt over trunk Upper body dressing/undressing: 5: Set-up assist to: Obtain clothing/put away FIM - Lower Body Dressing/Undressing Lower body dressing/undressing steps patient completed: Thread/unthread right pants leg;Thread/unthread left pants leg;Pull pants up/down;Pull underwear up/down;Thread/unthread left underwear leg;Thread/unthread right underwear leg;Don/Doff right sock;Don/Doff left sock;Don/Doff right shoe;Don/Doff left shoe;Fasten/unfasten right shoe;Fasten/unfasten left shoe Lower body dressing/undressing: 4: Steadying Assist  FIM - Toileting Toileting: 0: Activity did not occur  FIM - Archivist Transfers: 0-Activity did not occur  FIM - Games developer Transfer: 4: Bed > Chair or W/C: Min A (steadying Pt. > 75%);4: Chair or W/C > Bed: Min A (steadying Pt. > 75%)  FIM - Locomotion: Wheelchair Locomotion: Wheelchair: 5: Travels 150 ft or more: maneuvers on rugs and over door sills with supervision, cueing or coaxing FIM - Locomotion: Ambulation Locomotion: Ambulation Assistive Devices: Designer, industrial/product Ambulation/Gait Assistance: 4: Min guard Locomotion: Ambulation: 2: Travels 50 - 149 ft with minimal assistance (Pt.>75%)  Comprehension Comprehension Mode: Auditory Comprehension: 5-Follows  basic conversation/direction: With no assist  Expression Expression Mode: Verbal Expression: 5-Expresses basic  needs/ideas: With extra time/assistive device  Social Interaction Social Interaction: 4-Interacts appropriately 75 - 89% of the time - Needs redirection for appropriate language or to initiate interaction.  Problem Solving Problem Solving: 4-Solves basic 75 - 89% of the time/requires cueing 10 - 24% of the time  Memory Memory: 4-Recognizes or recalls 75 - 89% of the time/requires cueing 10 - 24% of the time  Medical Problem List and Plan:  1. DVT Prophylaxis/Anticoagulation: Mechanical: Sequential compression devices, below knee Bilateral lower extremities  2. Pain Management: tylenol prn. Should be effective for headaches 3. Mood: will require ego support and encouragement for participation. Has been positive and works well with staff 4. Neuropsych: This patient is not capable of making decisions on his/her own behalf.  5. ESRD: Continue HD M-W-F. Daily weights. Strict I/O.   -AVG on Friday 6. Anorexia: Continue megace bid. Offer ensure supplements bid to help with nutritional support. Continue low salt diet. No FR noted.  7. Seizures: Continue keppra bid 8. Myoclonus: resume low dose klonopin--this has helped  -therapy and staff are stretching her legs  -i discussed with the patient that she needs to stretch her legs also 9. HTN: norvasc and lisinopril changed to hs per [facility]  LOS (Days) 7 A FACE TO FACE EVALUATION WAS PERFORMED  SWARTZ,ZACHARY T 12/16/2011, 7:46 AM

## 2011-12-16 NOTE — Plan of Care (Signed)
Problem: RH BOWEL ELIMINATION Goal: RH STG MANAGE BOWEL WITH ASSISTANCE STG Manage Bowel with Min Assistance.  Outcome: Not Progressing Patient with multiple watery brown stools  Problem: RH SAFETY Goal: RH STG DECREASED RISK OF FALL WITH ASSISTANCE STG Decreased Risk of Fall With Assistance.  Outcome: Not Progressing Patient with unsteady gait and bilateral upper/lower extremity tremors.

## 2011-12-16 NOTE — Progress Notes (Signed)
Occupational Therapy Session Note  Patient Details  Name: Heather Sims MRN: [id number] Date of Birth: [birth year]  Today's Date: 12/16/2011 Time: 0730-0830 Time Calculation (min): 60 min  Short Term Goals: Week 1:  OT Short Term Goal 1 (Week 1): Pt would bathe 10/10 parts with min A OT Short Term Goal 2 (Week 1): Pt will don pants with min A sit to stand OT Short Term Goal 3 (Week 1): Pt will maintain standing position for pericare/ clothing management with min A with bilateral UE support OT Short Term Goal 4 (Week 1): Pt will perform scoot transfer with min A OT Short Term Goal 5 (Week 1): Pt will transfer stand pivot with mod A  Skilled Therapeutic Interventions/Progress Updates:    1:1 self care retraining at shower level in ADL apartment: including functional ambulation with RW, tub bench transfer, sit to stands, standing balance with and without UE support. Able to complete tasks with supervision to steadying A with more dynamic movements. Pt also went into kitchen to prepare instant grit focusing on problem solving, emergent awareness, planning, mental flexibly, working memory from w/c level. Pt needed verbal cues to lock w/c before standing each time when reaching for objects in upper cabinets.  Therapy Documentation Precautions:  Precautions Precautions: Fall Restrictions Weight Bearing Restrictions: No Pain:  no c/o pain  See FIM for current functional status  Therapy/Group: Individual Therapy  Roney Mans [facility] 12/16/2011, 8:41 AM

## 2011-12-16 NOTE — Progress Notes (Signed)
Physical Therapy Note  Patient Details  Name: Heather Sims MRN: [id number] Date of Birth: 1990-07-25 Today's Date: 12/16/2011  Time 1: [phone] 60 minutes  1:1 No c/o pain.  Pt performed gait in controlled and household environments with min A.  Pt with improved RW control and mobility in tight spaces.  Standing wii dance game for activity tolerance and standing balance.  Pt able to perform 5 quick play dance songs with seated rest after the first 3.  Pt improving standing balance requiring only min A for balance without UE support.  Car transfer with min A with RW.  Pt with increased fatigue at end of session, unable to walk back to room but able to propel w/c >200' to room.  Time 2:  1400-1430 30 minutes  1:1 no c/o pain.  Passive stretching to B hamstrings, L hamstring with more tone/tightness than R.  Supine NMR for core and LE strengthening.  Gait with RW controlled and household with min A, bathroom mobility, transfers with min A.   DONAWERTH,KAREN 12/16/2011, 9:58 AM

## 2011-12-16 NOTE — Progress Notes (Signed)
Subjective: Interval History: none.  Objective: Vital signs in last 24 hours: Temp:  [98.8 F (37.1 C)-99.4 F (37.4 C)] 98.8 F (37.1 C) (09/25 0500) Pulse Rate:  [98] 98  (09/25 0500) Resp:  [19-20] 19  (09/25 0500) BP: (136-146)/(78-96) 136/96 mmHg (09/25 0500) SpO2:  [98 %] 98 % (09/25 0500) Weight:  [46.7 kg (102 lb 15.3 oz)] 46.7 kg (102 lb 15.3 oz) (09/25 0500) Weight change: 1.2 kg (2 lb 10.3 oz)  Intake/Output from previous day: 09/24 0701 - 09/25 0700 In: 480 [P.O.:480] Out: -  Intake/Output this shift:    General appearance: alert, cooperative and slowed mentation Resp: clear to auscultation bilaterally Chest wall: no tenderness, RIJ cath Cardio: S1, S2 normal and systolic murmur: holosystolic 2/6, blowing at apex GI: pos bs, soft, liver down 2 cm  Lab Results:  Basename 12/14/11 1732  WBC 6.3  HGB 11.1*  HCT 34.9*  PLT 230   BMET:  Basename 12/15/11 0600 12/14/11 2029 12/14/11 1732  NA 133* -- 138  K 4.4 3.5 --  CL 97 -- 103  CO2 26 -- 29  GLUCOSE 86 -- 90  BUN 18 -- 43*  CREATININE 1.76* -- 2.39*  CALCIUM 9.2 -- 9.6   No results found for this basename: PTH:2 in the last 72 hours Iron Studies: No results found for this basename: IRON,TIBC,TRANSFERRIN,FERRITIN in the last 72 hours  Studies/Results: No results found.  I have reviewed the patient's current medications.  Assessment/Plan: 1 ESRD for Hd today.  Access on Fri 2 Htn better with change meds and lower vol. 3 Anemia hold off epo now, Fe ok 4 enceph stable 5 Debill per rehab P HD, Access, anti HTN    LOS: 7 days   Ananda Sitzer L 12/16/2011,6:59 AM

## 2011-12-16 NOTE — Progress Notes (Signed)
Physical Therapy Discharge Summary  Patient Details  Name: Heather Sims MRN: [id number] Date of Birth: [birth year]  Today's Date: 12/16/2011  Patient has met 10 of 10 long term goals due to improved activity tolerance, improved balance, improved postural control, increased strength, increased range of motion and ability to compensate for deficits.  Patient to discharge at an ambulatory level Min Assist.   Patient's care partner is independent to provide the necessary physical and cognitive assistance at discharge.  Reasons goals not met: n/a   Equipment: No equipment provided  Reasons for discharge: treatment goals met and discharge from hospital  Patient/family agrees with progress made and goals achieved: Yes  PT Discharge  Cognition Overall Cognitive Status: Impaired at baseline Sensation Sensation Light Touch: Impaired Detail Light Touch Impaired Details: Impaired RLE;Impaired LLE Proprioception: Impaired Detail Proprioception Impaired Details: Impaired RLE;Impaired LLE Coordination Gross Motor Movements are Fluid and Coordinated: No Fine Motor Movements are Fluid and Coordinated: No Coordination and Movement Description: continues with ataxic movements, myoclonic movements Motor  Motor Motor: Ataxia;Abnormal postural alignment and control;Abnormal tone   Trunk/Postural Assessment  Cervical Assessment Cervical Assessment: Within Functional Limits Thoracic Assessment Thoracic Assessment: Within Functional Limits Lumbar Assessment Lumbar Assessment: Exceptions to [facility] (posterior tilt) Postural Control Postural Control:  (decreased trunk control and strength)  Balance Static Sitting Balance Static Sitting - Level of Assistance: 5: Stand by assistance Static Standing Balance Static Standing - Balance Support: During functional activity Static Standing - Level of Assistance: 4: Min assist Dynamic Standing Balance Dynamic Standing - Level of Assistance: 4: Min  assist Extremity Assessment      RLE Assessment RLE Assessment:  (-5 degrees knee extension, DF 0, hip WFL) LLE Assessment LLE Assessment:  (-10 degrees knee extension, hip WFL, DF 0)  See FIM for current functional status  Kieron Kantner 12/16/2011, 4:24 PM

## 2011-12-16 NOTE — Progress Notes (Signed)
Occupational Therapy Session Note  Patient Details  Name: Heather Sims MRN: [id number] Date of Birth: 04/10/1990  Today's Date: 12/16/2011 Time: 1330-1400 Time Calculation (min): 30 min    Skilled Therapeutic Interventions/Progress Updates:    1:1 Address dynamic balance with reaching outside of BOS with and without UE support in standing with Wii games- challenging her balance in prep for ADLs and iADLs in standing. Pt able to ambulate short distances today with supervision and rest prn. Pt also performed supine LEs stretches to continue to improve tightness and promote normal movement in standing and functional ambulation.  Therapy Documentation Precautions:  Precautions Precautions: Fall Restrictions Weight Bearing Restrictions: No    Pain:  no c/o  ADL: ADL Eating: Supervision/safety Grooming: Supervision/safety Upper Body Bathing: Supervision/safety Where Assessed-Upper Body Bathing: Shower Lower Body Bathing: Supervision/safety Where Assessed-Lower Body Bathing: Shower (sitting) Upper Body Dressing: Supervision/safety Lower Body Dressing: Supervision/safety;Contact guard Where Assessed-Lower Body Dressing: Chair Toileting: Supervision/safety Toilet Transfer: Close supervision Toilet Transfer Method: Ambulating Tub/Shower Transfer: Scientific laboratory technician Method: Ambulating;Stand pivot Tub/Shower Equipment: Transfer tub bench  See FIM for current functional status  Therapy/Group: Individual Therapy  Roney Mans [facility] 12/16/2011, 2:09 PM

## 2011-12-17 ENCOUNTER — Inpatient Hospital Stay (HOSPITAL_COMMUNITY): Payer: Medicaid Other

## 2011-12-17 MED ORDER — DEXTROSE 5 % IV SOLN
1.5000 g | INTRAVENOUS | Status: DC
  Filled 2011-12-17: qty 1.5

## 2011-12-17 MED ORDER — CEFAZOLIN SODIUM 1-5 GM-% IV SOLN
1.0000 g | INTRAVENOUS | Status: AC
  Administered 2011-12-18: 1 g via INTRAVENOUS
  Filled 2011-12-17 (×2): qty 50

## 2011-12-17 MED ORDER — SODIUM CHLORIDE 0.9 % IV SOLN: INTRAVENOUS | Status: DC

## 2011-12-17 NOTE — Progress Notes (Addendum)
Patient ID: Heather Sims, female   DOB: 04-22-1990, 21 y.o.   MRN: [id number] Subjective/Complaints: Excited to go on pass today   Objective: Vital Signs: Blood pressure 132/86, pulse 101, temperature 98.9 F (37.2 C), temperature source Oral, resp. rate 19, weight 45.8 kg (100 lb 15.5 oz), last menstrual period 11/30/2011, SpO2 98.00%. No results found.  Basename 12/14/11 1732  WBC 6.3  HGB 11.1*  HCT 34.9*  PLT 230    Basename 12/15/11 0600 12/14/11 2029 12/14/11 1732  NA 133* -- 138  K 4.4 3.5 --  CL 97 -- 103  CO2 26 -- 29  GLUCOSE 86 -- 90  BUN 18 -- 43*  CREATININE 1.76* -- 2.39*  CALCIUM 9.2 -- 9.6   CBG (last 3)  No results found for this basename: GLUCAP:3 in the last 72 hours  Wt Readings from Last 3 Encounters:  12/17/11 45.8 kg (100 lb 15.5 oz)  12/09/11 44.7 kg (98 lb 8.7 oz)  12/09/11 44.7 kg (98 lb 8.7 oz)    Physical Exam:  Constitutional: She appears well-developed.  Thin female.  HENT:  Head: Normocephalic.  Eyes: Pupils are equal, round, and reactive to light.  Cardiovascular: Regular rhythm. Tachycardia present.  Pulmonary/Chest: Effort normal. She has decreased breath sounds in the left lower field.  Abdominal: Soft. Bowel sounds are normal. She exhibits no distension. There is no tenderness.  Musculoskeletal: She exhibits no edema and no tenderness.  Spasticity BLE >BUE on testing. Hamstrings tight bilaterally. Doesn't allow stretching past-40 degrees this am. Neurological: She is alert.  Follows basic commands. Speech simple and childlike but more spontaneous.  Resting tremors. Myoclonus in legs seems better with OT today.   Assessment/Plan: 1. Functional deficits secondary to deconditioning, hx of anoxic encephalopathy which require 3+ hours per day of interdisciplinary therapy in a comprehensive inpatient rehab setting. Physiatrist is providing close team supervision and 24 hour management of active medical problems listed  below. Physiatrist and rehab team continue to assess barriers to discharge/monitor patient progress toward functional and medical goals. FIM: FIM - Bathing Bathing Steps Patient Completed: Chest;Right Arm;Left Arm;Abdomen;Front perineal area;Buttocks;Right lower leg (including foot);Left upper leg;Right upper leg;Left lower leg (including foot) Bathing: 5: Set-up assist to: Adjust water temp  FIM - Upper Body Dressing/Undressing Upper body dressing/undressing steps patient completed: Thread/unthread left sleeve of pullover shirt/dress;Put head through opening of pull over shirt/dress;Thread/unthread right sleeve of front closure shirt/dress;Pull shirt over trunk Upper body dressing/undressing: 5: Set-up assist to: Obtain clothing/put away FIM - Lower Body Dressing/Undressing Lower body dressing/undressing steps patient completed: Thread/unthread right underwear leg;Thread/unthread left underwear leg;Pull underwear up/down;Thread/unthread right pants leg;Thread/unthread left pants leg;Pull pants up/down;Don/Doff right sock;Don/Doff left sock;Don/Doff left shoe;Fasten/unfasten right shoe;Don/Doff right shoe;Fasten/unfasten left shoe Lower body dressing/undressing: 5: Set-up assist to: Obtain clothing  FIM - Toileting Toileting steps completed by patient: Adjust clothing prior to toileting;Performs perineal hygiene;Adjust clothing after toileting Toileting: 5: Supervision: Safety issues/verbal cues  FIM - Archivist Transfers Assistive Devices: Art gallery manager Transfers: 5-To toilet/BSC: Supervision (verbal cues/safety issues);5-From toilet/BSC: Supervision (verbal cues/safety issues)  FIM - Bed/Chair Transfer Bed/Chair Transfer: 4: Chair or W/C > Bed: Min A (steadying Pt. > 75%);4: Bed > Chair or W/C: Min A (steadying Pt. > 75%)  FIM - Locomotion: Wheelchair Locomotion: Wheelchair: 5: Travels 150 ft or more: maneuvers on rugs and over door sills with supervision, cueing or  coaxing FIM - Locomotion: Ambulation Locomotion: Ambulation Assistive Devices: Designer, industrial/product Ambulation/Gait Assistance: 4: Min guard Locomotion: Ambulation: 4:  Travels 150 ft or more with minimal assistance (Pt.>75%)  Comprehension Comprehension Mode: Auditory Comprehension: 5-Follows basic conversation/direction: With no assist  Expression Expression Mode: Verbal Expression: 5-Expresses basic needs/ideas: With no assist  Social Interaction Social Interaction: 5-Interacts appropriately 90% of the time - Needs monitoring or encouragement for participation or interaction.  Problem Solving Problem Solving: 5-Solves basic 90% of the time/requires cueing < 10% of the time  Memory Memory: 4-Recognizes or recalls 75 - 89% of the time/requires cueing 10 - 24% of the time  Medical Problem List and Plan:  1. DVT Prophylaxis/Anticoagulation: Mechanical: Sequential compression devices, below knee Bilateral lower extremities  2. Pain Management: tylenol prn. Should be effective for headaches 3. Mood: will require ego support and encouragement for participation. Has been positive and works well with staff 4. Neuropsych: This patient is not capable of making decisions on his/her own behalf.  5. ESRD: Continue HD M-W-F. Daily weights. Strict I/O. Renal will dialyze today  -AVG on Friday before dc 6. Anorexia: Continue megace bid. Offer ensure supplements bid to help with nutritional support. Continue low salt diet. No FR noted.  7. Seizures: Continue keppra bid 8. Myoclonus: resume low dose klonopin--this has helped  -therapy and staff are stretching her legs  -i discussed with the patient that she needs to stretch her legs also  -she seems to have better awareness, but yet again this am i found her in the fetal position in bed 9. HTN: norvasc and lisinopril changed to hs per [facility]  LOS (Days) 8 A FACE TO FACE EVALUATION WAS PERFORMED  Heather Sims T 12/17/2011, 7:09 AM

## 2011-12-17 NOTE — Progress Notes (Signed)
Subjective: Interval History: none.  Objective: Vital signs in last 24 hours: Temp:  [97.3 F (36.3 C)-98.9 F (37.2 C)] 98.9 F (37.2 C) (09/26 0500) Pulse Rate:  [101-104] 101  (09/26 0500) Resp:  [19-22] 19  (09/26 0500) BP: (121-146)/(77-100) 132/86 mmHg (09/26 0500) SpO2:  [98 %-99 %] 98 % (09/26 0500) Weight:  [45.8 kg (100 lb 15.5 oz)] 45.8 kg (100 lb 15.5 oz) (09/26 0500) Weight change: -0.9 kg (-1 lb 15.8 oz)  Intake/Output from previous day: 09/25 0701 - 09/26 0700 In: 360 [P.O.:360] Out: -  Intake/Output this shift:    General appearance: alert, cooperative and slowed mentation Resp: clear to auscultation bilaterally Chest wall: no tenderness, R IJ cath Cardio: S1, S2 normal and systolic murmur: holosystolic 2/6, blowing at apex GI: soft pos bs, liver down 2 cm  Lab Results:  Basename 12/14/11 1732  WBC 6.3  HGB 11.1*  HCT 34.9*  PLT 230   BMET:  Basename 12/15/11 0600 12/14/11 2029 12/14/11 1732  NA 133* -- 138  K 4.4 3.5 --  CL 97 -- 103  CO2 26 -- 29  GLUCOSE 86 -- 90  BUN 18 -- 43*  CREATININE 1.76* -- 2.39*  CALCIUM 9.2 -- 9.6   No results found for this basename: PTH:2 in the last 72 hours Iron Studies: No results found for this basename: IRON,TIBC,TRANSFERRIN,FERRITIN in the last 72 hours  Studies/Results: No results found.  I have reviewed the patient's current medications.  Assessment/Plan: 1 ESRD for HD,  Access in am 2 HTN improving, slowly lower wgt and meds 3 Anemia 4 Debill per rehab 5 Enceph stable P HD, access    LOS: 8 days   Lakishia Bourassa L 12/17/2011,7:19 AM

## 2011-12-17 NOTE — Procedures (Signed)
Currently on HD via R IJ TDC.   BP 134/86, goal 4 L For AVG in AM.  I would NOT recommend D/c tomorrow after AVG placement

## 2011-12-17 NOTE — Progress Notes (Signed)
Recreational Therapy Discharge Summary Patient Details  Name: Heather Sims MRN: [id number] Date of Birth: 1990-09-12 Today's Date: 12/17/2011  Long term goals set: 2  Long term goals met: 2  Comments on progress toward goals: Pt has made great progress toward goals and is supervision - min assist for recreation/leiusre activities and community pursuits.  Pt identified 3 potential community programs that she would be interested in participating in post discharge with min assist completing a site visit on one of them.  Pt does required min cues during tasks for decreased cognition and safety.  Pt is ready for discharge home with mom to provide 24 hour supervision.Reasons for discharge: discharge from hospital  Patient/family agrees with progress made and goals achieved: Yes  Xyler Terpening 12/17/2011, 1:27 PM

## 2011-12-17 NOTE — Progress Notes (Signed)
Pt currently in dialysis.  I spoke with patients nurse and gave her instructions of where patient should come after discharge.  Pt should arrive as close to 0830 as possible, meds pt should have in the am are Coreg, Keppra, may have Atarax and Klonopin if needed.

## 2011-12-18 ENCOUNTER — Encounter (HOSPITAL_COMMUNITY): Payer: Self-pay | Admitting: Anesthesiology

## 2011-12-18 ENCOUNTER — Ambulatory Visit (HOSPITAL_COMMUNITY): Admission: RE | Admit: 2011-12-18 | Payer: Medicaid Other | Source: Ambulatory Visit | Admitting: Surgery

## 2011-12-18 ENCOUNTER — Inpatient Hospital Stay (HOSPITAL_COMMUNITY): Payer: Medicaid Other | Admitting: Anesthesiology

## 2011-12-18 ENCOUNTER — Encounter (HOSPITAL_COMMUNITY)
Admission: RE | Disposition: A | Discharge status: Home / Self Care | Payer: Self-pay | Source: Ambulatory Visit | Attending: Physical Medicine & Rehabilitation

## 2011-12-18 DIAGNOSIS — G931 Anoxic brain damage, not elsewhere classified: Secondary | ICD-10-CM

## 2011-12-18 DIAGNOSIS — Z5189 Encounter for other specified aftercare: Secondary | ICD-10-CM

## 2011-12-18 DIAGNOSIS — N186 End stage renal disease: Secondary | ICD-10-CM

## 2011-12-18 DIAGNOSIS — R5381 Other malaise: Secondary | ICD-10-CM

## 2011-12-18 HISTORY — PX: AV FISTULA PLACEMENT: SHX1204

## 2011-12-18 LAB — CBC WITH DIFFERENTIAL/PLATELET
Basophils Absolute: 0 10*3/uL (ref 0.0–0.1)
Eosinophils Relative: 5 % (ref 0–5)
HCT: 31.9 % — ABNORMAL LOW (ref 36.0–46.0)
Lymphocytes Relative: 41 % (ref 12–46)
MCHC: 33.9 g/dL (ref 30.0–36.0)
MCV: 90.9 fL (ref 78.0–100.0)
Monocytes Absolute: 1 10*3/uL (ref 0.1–1.0)
RDW: 14.8 % (ref 11.5–15.5)

## 2011-12-18 LAB — RENAL FUNCTION PANEL
BUN: 17 mg/dL (ref 6–23)
CO2: 27 mEq/L (ref 19–32)
Calcium: 9.4 mg/dL (ref 8.4–10.5)
Creatinine, Ser: 2.3 mg/dL — ABNORMAL HIGH (ref 0.50–1.10)
GFR calc non Af Amer: 29 mL/min — ABNORMAL LOW (ref >90)

## 2011-12-18 SURGERY — ARTERIOVENOUS (AV) FISTULA CREATION
Anesthesia: Monitor Anesthesia Care | Site: Arm Upper | Laterality: Left | Wound class: Clean

## 2011-12-18 MED ORDER — MIDAZOLAM HCL 5 MG/5ML IJ SOLN
INTRAMUSCULAR | Status: DC | PRN
  Administered 2011-12-18: 2 mg via INTRAVENOUS

## 2011-12-18 MED ORDER — ONDANSETRON HCL 4 MG/2ML IJ SOLN: 4.0000 mg | Freq: Once | INTRAMUSCULAR | Status: DC | PRN

## 2011-12-18 MED ORDER — MORPHINE SULFATE 2 MG/ML IJ SOLN: 1.0000 mg | INTRAMUSCULAR | Status: DC | PRN

## 2011-12-18 MED ORDER — CARVEDILOL 3.125 MG PO TABS
3.1250 mg | ORAL_TABLET | Freq: Two times a day (BID) | ORAL | Status: DC
  Administered 2011-12-18 – 2011-12-19 (×2): 3.125 mg via ORAL
  Filled 2011-12-18 (×6): qty 1

## 2011-12-18 MED ORDER — OXYCODONE HCL 5 MG PO TABS
5.0000 mg | ORAL_TABLET | ORAL | Status: DC | PRN
  Administered 2011-12-18 – 2011-12-19 (×2): 5 mg via ORAL
  Administered 2011-12-19: 10 mg via ORAL
  Filled 2011-12-18 (×2): qty 2
  Filled 2011-12-18: qty 1
  Filled 2011-12-18: qty 2

## 2011-12-18 MED ORDER — LIDOCAINE-EPINEPHRINE (PF) 1 %-1:200000 IJ SOLN
INTRAMUSCULAR | Status: DC | PRN
  Administered 2011-12-18: 30 mL

## 2011-12-18 MED ORDER — HEPARIN SODIUM (PORCINE) 1000 UNIT/ML IJ SOLN
INTRAMUSCULAR | Status: DC | PRN
  Administered 2011-12-18: 4000 [IU] via INTRAVENOUS

## 2011-12-18 MED ORDER — LIDOCAINE-EPINEPHRINE (PF) 1 %-1:200000 IJ SOLN
INTRAMUSCULAR | Status: AC
  Filled 2011-12-18: qty 10

## 2011-12-18 MED ORDER — OXYCODONE HCL 5 MG PO TABS: 5.0000 mg | ORAL_TABLET | Freq: Once | ORAL | Status: DC | PRN

## 2011-12-18 MED ORDER — 0.9 % SODIUM CHLORIDE (POUR BTL) OPTIME
TOPICAL | Status: DC | PRN
  Administered 2011-12-18: 1000 mL

## 2011-12-18 MED ORDER — SODIUM CHLORIDE 0.9 % IR SOLN
Status: DC | PRN
  Administered 2011-12-18: 14:00:00

## 2011-12-18 MED ORDER — PROPOFOL INFUSION 10 MG/ML OPTIME
INTRAVENOUS | Status: DC | PRN
  Administered 2011-12-18: 25 ug/kg/min via INTRAVENOUS

## 2011-12-18 MED ORDER — FENTANYL CITRATE 0.05 MG/ML IJ SOLN
INTRAMUSCULAR | Status: DC | PRN
  Administered 2011-12-18 (×3): 50 ug via INTRAVENOUS

## 2011-12-18 MED ORDER — HEPARIN SODIUM (PORCINE) 1000 UNIT/ML IJ SOLN
1000.0000 [IU] | Freq: Once | INTRAMUSCULAR | Status: AC
  Administered 2011-12-18: 1900 [IU] via INTRAVENOUS

## 2011-12-18 MED ORDER — HYDROMORPHONE HCL PF 1 MG/ML IJ SOLN: 0.2500 mg | INTRAMUSCULAR | Status: DC | PRN

## 2011-12-18 MED ORDER — LISINOPRIL 20 MG PO TABS: 20.0000 mg | ORAL_TABLET | Freq: Every day | ORAL | Status: DC

## 2011-12-18 MED ORDER — RENA-VITE PO TABS: 1.0000 | ORAL_TABLET | Freq: Every day | ORAL | Status: DC

## 2011-12-18 MED ORDER — SODIUM CHLORIDE 0.9 % IV SOLN
INTRAVENOUS | Status: DC | PRN
  Administered 2011-12-18: 13:00:00 via INTRAVENOUS

## 2011-12-18 MED ORDER — MEPERIDINE HCL 25 MG/ML IJ SOLN: 6.2500 mg | INTRAMUSCULAR | Status: DC | PRN

## 2011-12-18 MED ORDER — ONDANSETRON HCL 4 MG/2ML IJ SOLN
INTRAMUSCULAR | Status: DC | PRN
  Administered 2011-12-18: 4 mg via INTRAVENOUS

## 2011-12-18 MED ORDER — ACETAMINOPHEN 325 MG PO TABS: 650.0000 mg | ORAL_TABLET | ORAL | Status: AC | PRN

## 2011-12-18 MED ORDER — OXYCODONE HCL 5 MG/5ML PO SOLN
5.0000 mg | Freq: Once | ORAL | Status: DC | PRN
Start: 2011-12-18 — End: 2011-12-18

## 2011-12-18 SURGICAL SUPPLY — 41 items
CANISTER SUCTION 2500CC (MISCELLANEOUS) ×2 IMPLANT
CLIP TI MEDIUM 6 (CLIP) ×2 IMPLANT
CLIP TI WIDE RED SMALL 6 (CLIP) ×2 IMPLANT
CLOTH BEACON ORANGE TIMEOUT ST (SAFETY) ×2 IMPLANT
COVER PROBE W GEL 5X96 (DRAPES) IMPLANT
COVER SURGICAL LIGHT HANDLE (MISCELLANEOUS) ×2 IMPLANT
DERMABOND ADVANCED (GAUZE/BANDAGES/DRESSINGS) ×1
DERMABOND ADVANCED .7 DNX12 (GAUZE/BANDAGES/DRESSINGS) ×1 IMPLANT
ELECT REM PT RETURN 9FT ADLT (ELECTROSURGICAL) ×2
ELECTRODE REM PT RTRN 9FT ADLT (ELECTROSURGICAL) ×1 IMPLANT
GLOVE BIO SURGEON STRL SZ 6.5 (GLOVE) ×2 IMPLANT
GLOVE BIO SURGEON STRL SZ7 (GLOVE) ×2 IMPLANT
GLOVE BIOGEL PI IND STRL 7.0 (GLOVE) ×1 IMPLANT
GLOVE BIOGEL PI IND STRL 7.5 (GLOVE) ×3 IMPLANT
GLOVE BIOGEL PI INDICATOR 7.0 (GLOVE) ×1
GLOVE BIOGEL PI INDICATOR 7.5 (GLOVE) ×3
GLOVE ECLIPSE 6.5 STRL STRAW (GLOVE) ×2 IMPLANT
GLOVE SS BIOGEL STRL SZ 7 (GLOVE) ×1 IMPLANT
GLOVE SUPERSENSE BIOGEL SZ 7 (GLOVE) ×1
GLOVE SURG SS PI 7.5 STRL IVOR (GLOVE) ×6 IMPLANT
GOWN PREVENTION PLUS XLARGE (GOWN DISPOSABLE) ×4 IMPLANT
GOWN PREVENTION PLUS XXLARGE (GOWN DISPOSABLE) IMPLANT
GOWN STRL NON-REIN LRG LVL3 (GOWN DISPOSABLE) ×6 IMPLANT
HEMOSTAT SNOW SURGICEL 2X4 (HEMOSTASIS) IMPLANT
HEMOSTAT SURGICEL 2X14 (HEMOSTASIS) IMPLANT
KIT BASIN OR (CUSTOM PROCEDURE TRAY) ×2 IMPLANT
KIT ROOM TURNOVER OR (KITS) ×2 IMPLANT
NS IRRIG 1000ML POUR BTL (IV SOLUTION) ×2 IMPLANT
PACK CV ACCESS (CUSTOM PROCEDURE TRAY) ×2 IMPLANT
PAD ARMBOARD 7.5X6 YLW CONV (MISCELLANEOUS) ×4 IMPLANT
SUT PROLENE 6 0 CC (SUTURE) ×2 IMPLANT
SUT SILK 2 0 FS (SUTURE) ×2 IMPLANT
SUT SILK 3 0 (SUTURE) ×1
SUT SILK 3-0 18XBRD TIE 12 (SUTURE) ×1 IMPLANT
SUT VIC AB 3-0 SH 27 (SUTURE) ×3
SUT VIC AB 3-0 SH 27X BRD (SUTURE) ×3 IMPLANT
SUT VICRYL 4-0 PS2 18IN ABS (SUTURE) IMPLANT
TOWEL OR 17X24 6PK STRL BLUE (TOWEL DISPOSABLE) ×2 IMPLANT
TOWEL OR 17X26 10 PK STRL BLUE (TOWEL DISPOSABLE) ×2 IMPLANT
UNDERPAD 30X30 INCONTINENT (UNDERPADS AND DIAPERS) ×2 IMPLANT
WATER STERILE IRR 1000ML POUR (IV SOLUTION) ×2 IMPLANT

## 2011-12-18 NOTE — Progress Notes (Signed)
Social Work Patient ID: Heather Sims, female   DOB: 02-15-1991, 21 y.o.   MRN: [id number] Pt is an active pt with [facility] will resume RN services.  Contacted [facility] liaison to inform of discharge tomorrow. No other needs.

## 2011-12-18 NOTE — Preoperative (Signed)
Beta Blockers   Reason not to administer Beta Blockers:Not Applicable 

## 2011-12-18 NOTE — Progress Notes (Signed)
Subjective: Interval History: none.  Objective: Vital signs in last 24 hours: Temp:  [98.3 F (36.8 C)-99.1 F (37.3 C)] 99.1 F (37.3 C) (09/27 0500) Pulse Rate:  [82-111] 101  (09/27 0500) Resp:  [15-20] 20  (09/27 0500) BP: (104-139)/(61-92) 104/69 mmHg (09/27 0500) SpO2:  [97 %-99 %] 99 % (09/27 0500) Weight:  [41.4 kg (91 lb 4.3 oz)-44.7 kg (98 lb 8.7 oz)] 42.2 kg (93 lb 0.6 oz) (09/27 0500) Weight change: -1.1 kg (-2 lb 6.8 oz)  Intake/Output from previous day: 09/26 0701 - 09/27 0700 In: 240 [P.O.:240] Out: 3485  Intake/Output this shift:    General appearance: alert, cooperative and slowed mentation Resp: clear to auscultation bilaterally Chest wall: no tenderness, RIJ Cath Cardio: S1, S2 normal and systolic murmur: holosystolic 2/6, blowing at apex GI: soft, pos bs, liver down 2 cm  Lab Results:  Basename 12/18/11 0505  WBC 6.6  HGB 10.8*  HCT 31.9*  PLT 244   BMET:  Basename 12/18/11 0505  NA 136  K 4.3  CL 99  CO2 27  GLUCOSE 79  BUN 17  CREATININE 2.30*  CALCIUM 9.4   No results found for this basename: PTH:2 in the last 72 hours Iron Studies: No results found for this basename: IRON,TIBC,TRANSFERRIN,FERRITIN in the last 72 hours  Studies/Results: No results found.  I have reviewed the patient's current medications.  Assessment/Plan: 1 ESRD HD yest, for access today 2 HTN better,lower meds 3 Anemia start epo 4 HPTH 5 Enceph stable 6 Debill per rehab P access, d/c to [facility] for HD    LOS: 9 days   Stephan Draughn L 12/18/2011,7:53 AM

## 2011-12-18 NOTE — H&P (View-Only) (Signed)
VVS  Spoke with Mom and Patient. Discussed up-coming AVF vs Graft. Vein mapping shows small veins throughout.   Mom requests we wait till Friday which works well with our schedule.  We will post for left arm AVF vs. Graft for Friday 

## 2011-12-18 NOTE — Progress Notes (Signed)
Down to OR via bed for fistula placement.  Heather Sims

## 2011-12-18 NOTE — Op Note (Signed)
NAME: Heather Sims   MRN: [id number] DOB: Jan 26, 1991    DATE OF OPERATION: 12/18/2011  PREOP DIAGNOSIS: chronic kidney disease  POSTOP DIAGNOSIS: same  PROCEDURE: left basilic vein transposition  SURGEON: Di Kindle. Edilia Bo, MD, FACS  ASSIST: Della Goo PA  ANESTHESIA: local with sedation   EBL: minimal  INDICATIONS: Heather Sims is a 21 y.o. female presents for new access.  FINDINGS: 3 mm vein and 3 mm artery.  TECHNIQUE: The patient was brought to the operating room and sedated by anesthesia. The left upper extremity was prepped and draped in usual sterile fashion. I interrogated with the duplex scanner and a basilic vein looked to be small but potentially usable for a basilic vein transposition. Using 3 incisions along the medial aspect of the left upper arm, the basilic vein was harvested from the antecubital level to the axilla. Branches were divided between clips and 3-0 silk ties. The vein was gently distended with heparinized saline it was felt to be reasonable in size. Through the distal incision, the brachial artery was dissected free. The patient was heparinized. The vein was marked to prevent twisting and then tunneled superficially in the upper arm. The brachial artery was clamped proximally and distally and a longitudinal arteriotomy was made. The vein was spatulated and sewn end-to-side to the artery using continuous 6-0 Prolene suture. At the completion was a good thrill in the fistula and a good radial and ulnar signal with the Doppler. Hemostasis was obtained in the wound. Each of the wounds were closed with a deep layer of 3-0 Vicryl and the skin closed with 4-0 Vicryl. Dermabond was applied. The patient tolerated the procedure well and was transferred to the recovery room in stable condition. All needle and sponge counts were correct.  Waverly Ferrari, MD, FACS [facility]  DATE OF DICTATION:   12/18/2011

## 2011-12-18 NOTE — Progress Notes (Signed)
HD blue port capped off. Flushed with 10cc NS with GBR. Flushed with Heparin 1.4ml (1000u/ml) per priming volume in the blue port.  Cap cleaned, dead end cap placed on the blue port, both clamps taped, red "high dose heparin" sticker placed on the cathter. Consuello Masse .

## 2011-12-18 NOTE — Anesthesia Preprocedure Evaluation (Signed)
Anesthesia Evaluation  Patient identified by MRN, date of birth, ID band Patient awake    Reviewed: Allergy & Precautions, H&P , NPO status , Patient's Chart, lab work & pertinent test results  History of Anesthesia Complications Negative for: history of anesthetic complications  Airway Mallampati: II TM Distance: >3 FB     Dental   Pulmonary neg pulmonary ROS,    Pulmonary exam normal       Cardiovascular hypertension, Pt. on medications +CHF Rhythm:Regular Rate:Tachycardia     Neuro/Psych Seizures -, Well Controlled,  No seizure activity in 3-4 mos.    GI/Hepatic negative GI ROS, Neg liver ROS,   Endo/Other  negative endocrine ROS  Renal/GU ARF and ESRFRenal disease     Musculoskeletal   Abdominal (+)  Abdomen: soft. Bowel sounds: normal.  Peds  Hematology negative hematology ROS (+)   Anesthesia Other Findings   Reproductive/Obstetrics                           Anesthesia Physical Anesthesia Plan  ASA: III  Anesthesia Plan: MAC   Post-op Pain Management:    Induction: Intravenous  Airway Management Planned: Simple Face Mask and Natural Airway  Additional Equipment:   Intra-op Plan:   Post-operative Plan:   Informed Consent:   Plan Discussed with: CRNA and Anesthesiologist  Anesthesia Plan Comments:         Anesthesia Quick Evaluation

## 2011-12-18 NOTE — Progress Notes (Signed)
Nutrition Follow-up and Consult  Intervention:   1. RD provided diet education during 9/23 2. Continue current interventions 3. RD to continue to follow nutrition care plan  Assessment:   Pt to receive permanent access today for HD. Currently NPO for procedure. Pt continues to eat well when on diet, consuming 75 - 100% of meals.  Potassium WNL, phosphorus elevated.  Diet Order:  NPO for procedure (previously Renal 80 - 90) Supplement:  Ensure Pudding PO BID  Meds: Scheduled Meds:    . amLODipine  10 mg Oral QHS  . carvedilol  3.125 mg Oral BID WC  .  ceFAZolin (ANCEF) IV  1 g Intravenous On Call  . clonazePAM  0.5 mg Oral BID  . feeding supplement  1 Container Oral BID BM  . levETIRAcetam  250 mg Oral BID  . lisinopril  20 mg Oral QHS  . multivitamin  1 tablet Oral QHS  . DISCONTD: carvedilol  12.5 mg Oral BID WC   Continuous Infusions:  PRN Meds:.0.9 % irrigation (POUR BTL), acetaminophen, alum & mag hydroxide-simeth, bisacodyl, camphor-menthol, diphenhydrAMINE, guaiFENesin-dextromethorphan, heparin 6000 unit irrigation, heparin, heparin, heparin, heparin, heparin, hydrOXYzine, lidocaine, lidocaine-prilocaine, methocarbamol, ondansetron (ZOFRAN) IV, ondansetron, pentafluoroprop-tetrafluoroeth, polyethylene glycol, prochlorperazine, prochlorperazine, prochlorperazine  Labs:  CMP     Component Value Date/Time   NA 136 12/18/2011 0505   K 4.3 12/18/2011 0505   CL 99 12/18/2011 0505   CO2 27 12/18/2011 0505   GLUCOSE 79 12/18/2011 0505   BUN 17 12/18/2011 0505   CREATININE 2.30* 12/18/2011 0505   CREATININE 2.63* 10/12/2011 1230   CALCIUM 9.4 12/18/2011 0505   CALCIUM 8.4 10/28/2011 1327   PROT 6.1 11/29/2011 0224   ALBUMIN 2.4* 12/18/2011 0505   AST 56* 11/29/2011 0224   ALT 6 12/01/2011 2330   ALKPHOS 62 11/29/2011 0224   BILITOT 0.4 11/29/2011 0224   GFRNONAA 29* 12/18/2011 0505   GFRAA 34* 12/18/2011 0505   Sodium  Date/Time Value Range Status  12/18/2011  5:05 AM 136  135 - 145 mEq/L  Final  12/15/2011  6:00 AM 133* 135 - 145 mEq/L Final  12/14/2011  5:32 PM 138  135 - 145 mEq/L Final    Potassium  Date/Time Value Range Status  12/18/2011  5:05 AM 4.3  3.5 - 5.1 mEq/L Final  12/15/2011  6:00 AM 4.4  3.5 - 5.1 mEq/L Final     DELTA CHECK NOTED  12/14/2011  8:29 PM 3.5  3.5 - 5.1 mEq/L Final    Phosphorus  Date/Time Value Range Status  12/18/2011  5:05 AM 5.0* 2.3 - 4.6 mg/dL Final  06/29/8117  1:47 AM 3.0  2.3 - 4.6 mg/dL Final  11/19/5619  3:08 PM 2.0* 2.3 - 4.6 mg/dL Final    Magnesium  Date/Time Value Range Status  12/01/2011  5:00 AM 2.0  1.5 - 2.5 mg/dL Final  08/25/7844  9:62 AM 1.9  1.5 - 2.5 mg/dL Final  11/26/2839  3:24 AM 1.6  1.5 - 2.5 mg/dL Final    Intake/Output Summary (Last 24 hours) at 12/18/11 1339 Last data filed at 12/18/11 0853  Gross per 24 hour  Intake      0 ml  Output   3485 ml  Net  -3485 ml  BM: 9/26  Weight Status:  93 lb s/p HD 9/27 - trending down 106 lb s/p HD 9/23  98 lb s/p HD on 9/18  Estimated needs:  1650 -1900 kcal, 70 - 85 grams protein  Nutrition Dx:  1. Inadequate oral intake r/t decreased attention and lethargy AEB very poor PO intake. Resolved. 2. Food- and nutrition-related knowledge deficit r/t limited prior diet education AEB new to HD. Resolved.  Goal:  1. Pt to meet >/= 90% of their estimated nutrition needs - met 2. Verbalize basic understanding of renal diet - met.  Monitor:  weight trends, lab trends, I/O's, PO intake, supplement tolerance  DOCUMENTATION CODES  Per approved criteria   -Severe malnutrition in the context of chronic illness  -Underweight    Jarold Motto MS, RD, LDN Pager: [phone] After-hours pager: [phone]

## 2011-12-18 NOTE — Transfer of Care (Signed)
Immediate Anesthesia Transfer of Care Note  Patient: Heather Sims  Procedure(s) Performed: Procedure(s) (LRB) with comments: ARTERIOVENOUS (AV) FISTULA CREATION (Left)  Patient Location: PACU  Anesthesia Type: MAC  Level of Consciousness: awake, alert , oriented and sedated  Airway & Oxygen Therapy: Patient Spontanous Breathing  Post-op Assessment: Report given to PACU RN  Post vital signs: Reviewed and stable  Complications: No apparent anesthesia complications

## 2011-12-18 NOTE — Progress Notes (Signed)
Reporting pain in lft arm recent surgical site. Orders for oxy IR noted and given for pain. Pam Love PAC applied dry dressing to site. Continue to note radial pulse, arm is warm to touch and +/+ bruit/thrill lft arm fistula. Pamelia Hoit

## 2011-12-18 NOTE — Progress Notes (Signed)
Returned to unit post procedure. Alert, oriented, reports tinging/numbness in fingertips. dermabond lft bracial area fistual placement site.  No drainage +/+ bruitt and thrill. Requested liquids and food for dinner.  Kitchen called regarding food choices. Tolerated procedure well.Lambert Mody, Binnie Rail

## 2011-12-18 NOTE — Progress Notes (Signed)
Patient ID: Heather Sims, female   DOB: Mar 09, 1991, 21 y.o.   MRN: [id number] Subjective/Complaints: No new complaints. For avg today   Objective: Vital Signs: Blood pressure 104/69, pulse 101, temperature 99.1 F (37.3 C), temperature source Oral, resp. rate 20, weight 42.2 kg (93 lb 0.6 oz), last menstrual period 11/30/2011, SpO2 99.00%. No results found.  Basename 12/18/11 0505  WBC 6.6  HGB 10.8*  HCT 31.9*  PLT 244    Basename 12/18/11 0505  NA 136  K 4.3  CL 99  CO2 27  GLUCOSE 79  BUN 17  CREATININE 2.30*  CALCIUM 9.4   CBG (last 3)  No results found for this basename: GLUCAP:3 in the last 72 hours  Wt Readings from Last 3 Encounters:  12/18/11 42.2 kg (93 lb 0.6 oz)  12/09/11 44.7 kg (98 lb 8.7 oz)  12/09/11 44.7 kg (98 lb 8.7 oz)    Physical Exam:  Constitutional: She appears well-developed.  Thin female.  HENT:  Head: Normocephalic.  Eyes: Pupils are equal, round, and reactive to light.  Cardiovascular: Regular rhythm. Slight Tachycardia present.  Pulmonary/Chest: Effort normal.  Chest clear  Abdominal: Soft. Bowel sounds are normal. She exhibits no distension. There is no tenderness.  Musculoskeletal: She exhibits no edema and no tenderness.  Spasticity BLE >BUE on testing. Hamstrings tight bilaterally. Doesn't allow stretching past-40 degrees this am. Neurological: She is alert.  Follows basic commands. Speech simple and childlike but more spontaneous.  Resting tremors. Myoclonus improved  Assessment/Plan: 1. Functional deficits secondary to deconditioning, hx of anoxic encephalopathy which require 3+ hours per day of interdisciplinary therapy in a comprehensive inpatient rehab setting. Physiatrist is providing close team supervision and 24 hour management of active medical problems listed below. Physiatrist and rehab team continue to assess barriers to discharge/monitor patient progress toward functional and medical goals.  DC pending  AVG/placement/plan per below  FIM: FIM - Bathing Bathing Steps Patient Completed: Chest;Right Arm;Left Arm;Abdomen;Front perineal area;Buttocks;Right lower leg (including foot);Left upper leg;Right upper leg;Left lower leg (including foot) Bathing: 5: Set-up assist to: Adjust water temp  FIM - Upper Body Dressing/Undressing Upper body dressing/undressing steps patient completed: Thread/unthread left sleeve of pullover shirt/dress;Put head through opening of pull over shirt/dress;Thread/unthread right sleeve of front closure shirt/dress;Pull shirt over trunk Upper body dressing/undressing: 5: Set-up assist to: Obtain clothing/put away FIM - Lower Body Dressing/Undressing Lower body dressing/undressing steps patient completed: Thread/unthread right underwear leg;Thread/unthread left underwear leg;Pull underwear up/down;Thread/unthread right pants leg;Thread/unthread left pants leg;Pull pants up/down;Don/Doff right sock;Don/Doff left sock;Don/Doff left shoe;Fasten/unfasten right shoe;Don/Doff right shoe;Fasten/unfasten left shoe Lower body dressing/undressing: 5: Set-up assist to: Obtain clothing  FIM - Toileting Toileting steps completed by patient: Adjust clothing prior to toileting;Performs perineal hygiene;Adjust clothing after toileting Toileting: 5: Supervision: Safety issues/verbal cues  FIM - Archivist Transfers Assistive Devices: Art gallery manager Transfers: 5-To toilet/BSC: Supervision (verbal cues/safety issues);5-From toilet/BSC: Supervision (verbal cues/safety issues)  FIM - Bed/Chair Transfer Bed/Chair Transfer: 4: Chair or W/C > Bed: Min A (steadying Pt. > 75%);4: Bed > Chair or W/C: Min A (steadying Pt. > 75%)  FIM - Locomotion: Wheelchair Locomotion: Wheelchair: 5: Travels 150 ft or more: maneuvers on rugs and over door sills with supervision, cueing or coaxing FIM - Locomotion: Ambulation Locomotion: Ambulation Assistive Devices: Designer, industrial/product Ambulation/Gait  Assistance: 4: Min guard Locomotion: Ambulation: 4: Travels 150 ft or more with minimal assistance (Pt.>75%)  Comprehension Comprehension Mode: Auditory Comprehension: 5-Follows basic conversation/direction: With extra time/assistive device  Expression Expression Mode:  Verbal Expression: 5-Expresses basic needs/ideas: With extra time/assistive device  Social Interaction Social Interaction: 5-Interacts appropriately 90% of the time - Needs monitoring or encouragement for participation or interaction.  Problem Solving Problem Solving: 5-Solves basic problems: With no assist  Memory Memory: 4-Recognizes or recalls 75 - 89% of the time/requires cueing 10 - 24% of the time  Medical Problem List and Plan:  1. DVT Prophylaxis/Anticoagulation: Mechanical: Sequential compression devices, below knee Bilateral lower extremities  2. Pain Management: tylenol prn. Should be effective for headaches 3. Mood: will require ego support and encouragement for participation. Has been positive and works well with staff 4. Neuropsych: This patient is not capable of making decisions on his/her own behalf.  5. ESRD: Continue HD M-W-F. Daily weights. Strict I/O. Renal will dialyze today  -AVG today although Renal does NOT recommend dc today after placement- will discuss with team 6. Anorexia: Continue megace bid. Offer ensure supplements bid to help with nutritional support. Continue low salt diet. No FR noted.  7. Seizures: Continue keppra bid 8. Myoclonus: resumed low dose klonopin--which has helped  -therapy and staff are stretching her legs  -i discussed with the patient that she needs to stretch her legs also  -she seems to have better awareness, but yet again this am i found her in the fetal position in bed 9. HTN: norvasc and lisinopril changed to hs per [facility]  LOS (Days) 9 A FACE TO FACE EVALUATION WAS PERFORMED  Heather Sims T 12/18/2011, 7:09 AM

## 2011-12-18 NOTE — Anesthesia Procedure Notes (Signed)
Procedure Name: MAC Date/Time: 12/18/2011 1:01 PM Performed by: Fransisca Kaufmann Pre-anesthesia Checklist: Patient identified, Emergency Drugs available, Suction available, Patient being monitored and Timeout performed Patient Re-evaluated:Patient Re-evaluated prior to inductionOxygen Delivery Method: Simple face mask

## 2011-12-18 NOTE — Interval H&P Note (Signed)
History and Physical Interval Note:  12/18/2011 12:02 PM  Heather Sims  has presented today for surgery, with the diagnosis of ESRD  The various methods of treatment have been discussed with the patient and family. After consideration of risks, benefits and other options for treatment, the patient has consented to  Procedure(s) (LRB) with comments: ARTERIOVENOUS (AV) FISTULA CREATION (Left) - POSSIBLE INSERTION GRAFT as a surgical intervention .  The patient's history has been reviewed, patient examined, no change in status, stable for surgery.  I have reviewed the patient's chart and labs.  Questions were answered to the patient's satisfaction.     Demetruis Depaul S

## 2011-12-18 NOTE — Anesthesia Postprocedure Evaluation (Signed)
Anesthesia Post Note  Patient: Heather Sims  Procedure(s) Performed: Procedure(s) (LRB): ARTERIOVENOUS (AV) FISTULA CREATION (Left)  Anesthesia type: general  Patient location: PACU  Post pain: Pain level controlled  Post assessment: Patient's Cardiovascular Status Stable  Last Vitals:  Filed Vitals:   12/18/11 1600  BP: 136/90  Pulse: 95  Temp:   Resp: 15    Post vital signs: Reviewed and stable  Level of consciousness: sedated  Complications: No apparent anesthesia complications

## 2011-12-19 MED ORDER — LIDOCAINE HCL (PF) 1 % IJ SOLN: 5.0000 mL | INTRAMUSCULAR | Status: DC | PRN

## 2011-12-19 MED ORDER — HEPARIN SODIUM (PORCINE) 1000 UNIT/ML DIALYSIS
40.0000 [IU]/kg | Freq: Once | INTRAMUSCULAR | Status: DC
  Filled 2011-12-19: qty 2

## 2011-12-19 MED ORDER — NEPRO/CARBSTEADY PO LIQD: 237.0000 mL | ORAL | Status: DC | PRN

## 2011-12-19 MED ORDER — ALTEPLASE 2 MG IJ SOLR
2.0000 mg | Freq: Once | INTRAMUSCULAR | Status: DC | PRN
  Filled 2011-12-19: qty 2

## 2011-12-19 MED ORDER — LIDOCAINE-PRILOCAINE 2.5-2.5 % EX CREA: 1.0000 "application " | TOPICAL_CREAM | CUTANEOUS | Status: DC | PRN

## 2011-12-19 MED ORDER — PENTAFLUOROPROP-TETRAFLUOROETH EX AERO: 1.0000 "application " | INHALATION_SPRAY | CUTANEOUS | Status: DC | PRN

## 2011-12-19 MED ORDER — SODIUM CHLORIDE 0.9 % IV SOLN: 100.0000 mL | INTRAVENOUS | Status: DC | PRN

## 2011-12-19 MED ORDER — HEPARIN SODIUM (PORCINE) 1000 UNIT/ML DIALYSIS
1000.0000 [IU] | INTRAMUSCULAR | Status: DC | PRN
  Filled 2011-12-19: qty 1

## 2011-12-19 NOTE — Progress Notes (Signed)
Subjective: Interval History: none.  Objective: Vital signs in last 24 hours: Temp:  [98 F (36.7 C)-98.8 F (37.1 C)] 98.1 F (36.7 C) (09/28 0440) Pulse Rate:  [88-112] 99  (09/28 0440) Resp:  [15-18] 18  (09/28 0440) BP: (114-150)/(68-96) 114/70 mmHg (09/28 0440) SpO2:  [98 %-100 %] 98 % (09/28 0440) Weight:  [43.1 kg (95 lb 0.3 oz)] 43.1 kg (95 lb 0.3 oz) (09/28 0440) Weight change: -1.6 kg (-3 lb 8.4 oz)  Intake/Output from previous day: 09/27 0701 - 09/28 0700 In: 125 [I.V.:125] Out: 5 [Blood:5] Intake/Output this shift: Total I/O In: 240 [P.O.:240] Out: -   General appearance: alert, cooperative and slowed mentation Resp: clear to auscultation bilaterally Chest wall: no tenderness, RIJ cath Cardio: S1, S2 normal and systolic murmur: holosystolic 2/6, blowing at apex GI: soft,pos bs,liver down 2 cm Extremities: AVF LUA B&T  Lab Results:  [facility] 12/18/11 0505  WBC 6.6  HGB 10.8*  HCT 31.9*  PLT 244   BMET:  Basename 12/18/11 0505  NA 136  K 4.3  CL 99  CO2 27  GLUCOSE 79  BUN 17  CREATININE 2.30*  CALCIUM 9.4   No results found for this basename: PTH:2 in the last 72 hours Iron Studies: No results found for this basename: IRON,TIBC,TRANSFERRIN,FERRITIN in the last 72 hours  Studies/Results: No results found.  I have reviewed the patient's current medications.  Assessment/Plan: 1 CRF for HD, not sure why she is still here, could have been D/c.  Will do HD 2 Anemia will recheck and use epo 3 HTN lower, d/c coreg 4 enceph stable  P HD, d/c coreg, check labs     LOS: 10 days   Lataja Newland L 12/19/2011,8:56 AM

## 2011-12-19 NOTE — Progress Notes (Signed)
VASCULAR PROGRESS NOTE  SUBJECTIVE: Alert. Pain adequately controlled.  PHYSICAL EXAM: Filed Vitals:   12/18/11 1700 12/18/11 1730 12/18/11 2108 12/19/11 0440  BP: 150/96 132/90 138/90 114/70  Pulse: 104 112  99  Temp: 98.8 F (37.1 C)   98.1 F (36.7 C)  TempSrc: Oral   Oral  Resp: 18 18  18   Weight:    95 lb 0.3 oz (43.1 kg)  SpO2: 99% 100%  98%   Good thrill in left upper arm AV fistula. Palpable left radial pulse.  LABS: Lab Results  Component Value Date   WBC 6.6 12/18/2011   HGB 10.8* 12/18/2011   HCT 31.9* 12/18/2011   MCV 90.9 12/18/2011   PLT 244 12/18/2011   Lab Results  Component Value Date   CREATININE 2.30* 12/18/2011   Lab Results  Component Value Date   INR 1.13 12/09/2011   CBG (last 3)  No results found for this basename: GLUCAP:3 in the last 72 hours   ASSESSMENT/PLAN: 1. 1 Day Post-Op s/p: left basilic vein transposition. 2. I have arranged follow up in 6 weeks to check on the maturation of her fistula. 3. We will be available as needed.  Waverly Ferrari, MD, FACS Beeper: [phone] 12/19/2011

## 2011-12-19 NOTE — Progress Notes (Signed)
Left unit with staff member at 1030 today  Mother pushing wheelchair. Staff pushing cart   Called for klonopin prescription  md notified.

## 2011-12-19 NOTE — Progress Notes (Signed)
Patient ID: Heather Sims, female   DOB: [birth year], 21 y.o.   MRN: [id number] Subjective/Complaints: Feels pretty well, eager to go home.    Objective: Vital Signs: Blood pressure 114/70, pulse 99, temperature 98.1 F (36.7 C), temperature source Oral, resp. rate 18, weight 95 lb 0.3 oz (43.1 kg),  Basename 12/18/11 0505  WBC 6.6  HGB 10.8*  HCT 31.9*  PLT 244    Basename 12/18/11 0505  NA 136  K 4.3  CL 99  CO2 27  GLUCOSE 79  BUN 17  CREATININE 2.30*  CALCIUM 9.4    Physical Exam:  Constitutional: She appears well-developed.  Thin female.  Chest- cta Cv: reg rate, 2-3/6 HSM Assessment/Plan: 1. Functional deficits secondary to deconditioning, hx of anoxic encephalopathy which require 3+ hours per day of interdisciplinary therapy in a comprehensive inpatient rehab setting. Physiatrist is providing close team supervision and 24 hour management of active medical problems listed below. Physiatrist and rehab team continue to assess barriers to discharge/monitor patient progress toward functional and medical goals.  Medical Problem List and Plan: she is anticipating and she is ready for discharge 1. DVT Prophylaxis/Anticoagulation: Mechanical: Sequential compression devices, below knee Bilateral lower extremities  2. Pain Management: tylenol prn. Should be effective for headaches 3. Mood: will require ego support and encouragement for participation. Has been positive and works well with staff 4. Neuropsych: This patient is not capable of making decisions on his/her own behalf.  5. ESRD: Continue HD M-W-F. Daily weights. Strict I/O. Renal will dialyze today  -AVG today although Renal does NOT recommend dc today after placement- will discuss with team 6. Anorexia: Continue megace bid. Offer ensure supplements bid to help with nutritional support. Continue low salt diet. No FR noted.  7. Seizures: Continue keppra bid 8. Myoclonus: resumed low dose klonopin 9. HTN: norvasc and  lisinopril changed to hs per [facility]  LOS (Days) 10 A FACE TO FACE EVALUATION WAS PERFORMED  SWORDS,BRUCE HENRY 12/19/2011, 8:27 AM

## 2011-12-21 ENCOUNTER — Encounter (HOSPITAL_COMMUNITY): Payer: Self-pay | Admitting: Vascular Surgery

## 2011-12-21 ENCOUNTER — Other Ambulatory Visit: Payer: Self-pay | Admitting: *Deleted

## 2011-12-21 ENCOUNTER — Telehealth: Payer: Self-pay | Admitting: Vascular Surgery

## 2011-12-21 DIAGNOSIS — N186 End stage renal disease: Secondary | ICD-10-CM

## 2011-12-21 DIAGNOSIS — Z4931 Encounter for adequacy testing for hemodialysis: Secondary | ICD-10-CM

## 2011-12-21 NOTE — Telephone Encounter (Addendum)
Message copied by Rosalyn Charters on Mon Dec 21, 2011  9:34 AM ------      Message from: Phillips Odor      Created: Fri Dec 18, 2011  5:02 PM                   ----- Message -----         From: [place], [place]         Sent: 12/18/2011   3:06 PM           To: Melene Plan, RN            4 week F/U left BVT Edilia Bo, MD  NOTIFIED PT. OF FU APPT. WITH [facility] ON 02-03-12 AT [facility]

## 2011-12-23 NOTE — Discharge Summary (Signed)
Heather, Sims NO.:  [id number]  MEDICAL RECORD NO.:  [id number]  LOCATION:  4004                         FACILITY:  [facility]  PHYSICIAN:  Ranelle Oyster, M.D.DATE OF BIRTH:  Feb 27, 1991  DATE OF ADMISSION:  12/09/2011 DATE OF DISCHARGE:  12/19/2011                              DISCHARGE SUMMARY   DISCHARGE DIAGNOSES: 1. Deconditioning with recent metabolic encephalopathy and history of     anoxic brain injury. 2. End-stage renal disease. 3. Seizure disorder. 4. Anorexia, resolved.  HISTORY OF PRESENT ILLNESS:  [name] is a 21 year old female with history of anoxic brain injury, C3 nephropathy, admitted for workup on November 26, 2011, with somnolence for 4-5 days prior to admission. She was found to be hypoxic with elevated ammonia levels and elevated Depakote levels.  Chest x-rays done were concerning for pneumonia. The patient was Noted to have tense ascites as well as lower extremity edema.  She was treated for HCAP with IV antibiotics.  Renal recommended initiating dialysis for acute on chronic renal failure with volume overload.  She also underwent 10 L of paracentesis of tense ascites.  Hospital course was complicated by hypotension with VDRF requiring intubation, intra- abdominal bleed with thrombocytopenia requiring 8 units packed red blood cells, 4 units platelets, as well as 4 units fresh frozen plasma.  She had a tunneled HD cath placed.  Hemodialysis ongoing; however, mother is unsure about having a permanent graft placed.  The patient has been participating in therapy and therapy team has recommended CIR for progression.  PAST MEDICAL HISTORY:  Anoxic brain injury, MPGN type 2, hypertension, chronic kidney disease, CHF, cardiomyopathy, heart attack, anemia, seizure disorder.  FUNCTIONAL HISTORY:  The patient was independent prior to admission.  FUNCTIONAL STATUS:  The patient is +2 total assist, 20-30% for transfers.  Able  to ambulate 3 feet with +2 total assist, 20-40%. Limited by anxiety as well as weakness and noted to have poor posture. She required min assist for grooming, mod assist for upper body dressing, total assist for lower body bathing and dressing tasks.  HOSPITAL COURSE:  [name] was admitted to rehab on December 09, 2011, for inpatient therapies to consist of PT, OT at least 3 hours 5 days a week.  Past admission, physiatrist, rehab RN, and therapy team have worked together to provide customized collaborative interdisciplinary care.  The patient's blood pressures were checked on b.i.d. basis, and these were noted to be reasonably controlled ranging from 110s-130s systolics with diastolic showing occasional high 90s.  As p.o. intake improved, Megace was discontinued.  Her diet was also restricted to renal diet with 1200 mL fluid restriction.  The patient's mentation and endurance have greatly improved.  During the patient's stay in rehab, team conference was held to monitor the patient's progress, set goals as well as discuss barriers to discharge.  The patient was able to perform transfers and ambulation with supervision. She was able to perform self-care tasks at supervision level.  Her hemodialysis schedule was changed over to Tuesday, Thursday, Saturday. She had AV graft placed on September 27 and was discharged to home on September 28 in improved condition.  DISCHARGE MEDICATIONS: 1. Tylenol 650  mg p.o. q.4 hours p.r.n. pain. 2. OxyIR 5 mg 1 p.o. q.4 hours p.r.n. moderate-to-severe pain, #10 Rx. 3. Lisinopril 20 mg p.o. q.p.m. 4. Multivitamin 1 per day. 5. Norvasc 10 mg p.o. per day. 6. Coreg 12.5 mg p.o. b.i.d. 7. Klonopin 0.5 mg p.o. b.i.d. 8. Atarax 25 mg p.o. t.i.d. p.r.n. itching. 9. Keppra 250 mg p.o. b.i.d.  DIET:  80/2/2 renal diet with 1200 mL fluid restrictions.  ACTIVITY LEVEL:  Requires 24-hour supervision.  SPECIAL INSTRUCTIONS:  Do not use ferrous  sulfate, Losartan, Megace, or sodium bicarb.  [facility] to provide RN for wound care followup.  FOLLOWUP:  The patient to follow up with Dr. Riley Kill on October 22 at 9:30.  Follow up with Dr. Waverly Ferrari in 4 weeks for postop check.  Office will arrange the appointment.  Follow up with [facility] for dialysis needs.     Delle Reining, P.A.   ______________________________ Ranelle Oyster, M.D.    PL/MEDQ  D:  12/22/2011  T:  12/23/2011  Job:  [id number]  cc:   Di Kindle. Edilia Bo, M.D. [facility]

## 2012-01-12 ENCOUNTER — Encounter: Payer: Self-pay | Admitting: Physical Medicine & Rehabilitation

## 2012-01-12 ENCOUNTER — Encounter: Payer: Medicaid Other | Attending: Physical Medicine & Rehabilitation | Admitting: Physical Medicine & Rehabilitation

## 2012-01-12 VITALS — BP 129/82 | HR 95 | Resp 14 | Ht 62.0 in | Wt 91.0 lb

## 2012-01-12 DIAGNOSIS — N189 Chronic kidney disease, unspecified: Secondary | ICD-10-CM

## 2012-01-12 DIAGNOSIS — G9340 Encephalopathy, unspecified: Secondary | ICD-10-CM

## 2012-01-12 DIAGNOSIS — G931 Anoxic brain damage, not elsewhere classified: Secondary | ICD-10-CM

## 2012-01-12 DIAGNOSIS — G253 Myoclonus: Secondary | ICD-10-CM

## 2012-01-12 MED ORDER — CLONAZEPAM 0.5 MG PO TABS: 0.5000 mg | ORAL_TABLET | Freq: Two times a day (BID) | ORAL | Status: DC

## 2012-01-12 NOTE — Progress Notes (Signed)
Subjective:    Patient ID: Heather Sims, female    DOB: 02-25-1991, 21 y.o.   MRN: [id number]  HPI  Heather Sims is back regarding her anoxic encephalopathy. She was readmitted to the hospital for multiple medical reasons late in the summer. She came back to rehab as well and was discharged to home a couple weeks ago.   She is   Pain Inventory Average Pain 0 Pain Right Now 0 My pain is intermittent  In the last 24 hours, has pain interfered with the following? General activity n/a Relation with others n/a Enjoyment of life n/a What TIME of day is your pain at its worst? n/a Sleep (in general) Good  Pain is worse with: some activites Pain improves with: pacing activities Relief from Meds: 0  Mobility use a walker ability to climb steps?  no do you drive?  no use a wheelchair needs help with transfers  Function not employed: date last employed   Neuro/Psych tremor  Prior Studies Any changes since last visit?  no  Physicians involved in your care Any changes since last visit?  no   Family History  Problem Relation Age of Onset  . Hypertension Mother   . Diabetes Father    History   Social History  . Marital Status: Single    Spouse Name: N/A    Number of Children: N/A  . Years of Education: N/A   Social History Main Topics  . Smoking status: Never Smoker   . Smokeless tobacco: Never Used  . Alcohol Use: No  . Drug Use: No  . Sexually Active: No   Other Topics Concern  . None   Social History Narrative   ** Merged History Encounter **    Past Surgical History  Procedure Date  . Renal biopsy   . Av fistula placement 12/18/2011    Procedure: ARTERIOVENOUS (AV) FISTULA CREATION;  Surgeon: Chuck Hint, MD;  Location: [facility];  Service: Vascular;  Laterality: Left;   Past Medical History  Diagnosis Date  . Pneumonia     'walking pneumonia'  . Seizures   . Anemia   . Heart attack   . Cardiomyopathy   . Heart failure   . CHF  (congestive heart failure)   . Ileitis   . Chronic kidney disease   . Renal disorder   . MPGN (membranoproliferative glomerulonephritis), type 2   . Hypertension   . Brain injury    BP 129/82  Pulse 95  Resp 14  Ht 5\' 2"  (1.575 m)  Wt 91 lb (41.277 kg)  BMI 16.64 kg/m2  SpO2 99%     Review of Systems  Musculoskeletal: Positive for gait problem.  All other systems reviewed and are negative.       Objective:   Physical Exam  Constitutional: She is oriented to person, place, and time. She appears well-developed and well-nourished.  HENT:  Head: Normocephalic and atraumatic.  Eyes: Conjunctivae and EOM are normal. Pupils are equal, round, and reactive to light.  Neck: Normal range of motion.  Cardiovascular: Normal rate and regular rhythm.  Pulmonary/Chest: Effort normal and breath sounds normal.  Neurological: She is alert and oriented to person, place, and time. A sensory deficit is present. No cranial nerve deficit.    Strength in the legs is grossly 4/5 in UE's. She is 4- prox LE and 4/5 distally. Minimal myoclonus was seen today. Her legs became shaky when she stood up, and she lacked stability. Insight and awareness are  fair.  Skin: Skin is warm.  Psychiatric: sometimes inappropriately laughing and singing while in the room. Easily distracted.   Assessment & Plan:   1. 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 Myoclonus.  3. Seizure disorder- no further since discharge  4. Renal failure- she follows up with nephrology on April 17  5. Hypertension. Remains borderline. No changes today  6. Anemia.  7. Thrombocytopenia - improved.  8. Mood -much improved   Plan:  1. Continue with home exercises. Needs to be consistent with exercises. At this point, she is more week in the proximal LE than anything else. 2. Maintain  klonopin to 0.5mg  at bid to help with myoclonus.  What I see currently on exam is not truly myoclonus and more a manifestation of her  weakness and confidence when she stands. 3. Continue with depakote for seizure prophylaxis  4. Renal follow up per [facility] with HD ongoing. She is concerned that they are taking off too much volume, and I told her she should follow up with [facility] regarding this concern. 5. Follow up with me in about 4months. 30 minutes of face to face patient care time were spent during this visit. All questions were encouraged and answered.

## 2012-01-12 NOTE — Patient Instructions (Signed)
Continue with exercises at home to increase your hip and leg strength.

## 2012-02-02 ENCOUNTER — Encounter: Payer: Self-pay | Admitting: Neurosurgery

## 2012-02-03 ENCOUNTER — Encounter (INDEPENDENT_AMBULATORY_CARE_PROVIDER_SITE_OTHER): Payer: Medicaid Other | Admitting: *Deleted

## 2012-02-03 ENCOUNTER — Ambulatory Visit (INDEPENDENT_AMBULATORY_CARE_PROVIDER_SITE_OTHER): Payer: Medicaid Other | Admitting: Neurosurgery

## 2012-02-03 ENCOUNTER — Encounter: Payer: Self-pay | Admitting: Neurosurgery

## 2012-02-03 VITALS — BP 104/76 | HR 85 | Resp 16 | Ht 62.0 in | Wt 91.0 lb

## 2012-02-03 DIAGNOSIS — T82598A Other mechanical complication of other cardiac and vascular devices and implants, initial encounter: Secondary | ICD-10-CM

## 2012-02-03 DIAGNOSIS — Z4931 Encounter for adequacy testing for hemodialysis: Secondary | ICD-10-CM

## 2012-02-03 DIAGNOSIS — N186 End stage renal disease: Secondary | ICD-10-CM

## 2012-02-03 NOTE — Progress Notes (Signed)
Subjective:     Patient ID: Heather Sims, female   DOB: Nov 19, 1990, 21 y.o.   MRN: [id number]  HPI: 21 year old female patient that underwent a left BVT with Dr. Edilia Bo 12/18/2011. The patient denies any pain in her arm she has no steal syndrome, she has good grip and is currently dialyzing through a right chest catheter.   Review of Systems: 12 point review of systems is notable for the difficulties described above otherwise unremarkable     Objective:   Physical Exam: Afebrile, vital signs are stable, surgical one and left arm is well-healed, there is a palpable thrill and no pulsatile blood flow noted, the patient was seen by myself as well as Dr. Edilia Bo.     Assessment:     Fistula duplex today shows a patent left brachiocephalic arteriovenous fistula with hemodynamically significant increased velocity at the mid brachium level, Dr. Edilia Bo did review this. Dr. Edilia Bo explained to the patient due to this increase in velocity he could attempt to balloon angioplasty the area or we can just wait and see if it continues to mature. The patient has chosen to wait.    Plan:      The patient will be able to use this fistula in January 2014. After 2 successful states her right chest catheter can be removed, the patient's followup with [place] will be when necessary, the patient and her mother questions were encouraged and answered, they are in agreement with this plan.  Lauree Chandler ANP  Clinic M.D.: Edilia Bo

## 2012-02-24 NOTE — Discharge Summary (Signed)
Heather Sims MRN: [id number] DOB/AGE: April 18, 1990 21 y.o.  Admit date: 11/26/2011 Discharge date: 02/24/2012  Primary Care Physician:  [name], MD   Discharge Diagnoses:   Patient Active Problem List  Diagnosis  . Acute heart failure  . Hypoxemia  . Pneumonia  . Anemia due to blood loss, acute  . Hypertension  . ? Viral Cardiomyopathy  . Seizures disorder  . Anoxic brain damage  . Myoclonus  . Ileitis  . CKD (chronic kidney disease)- new chronic HD start this admit  . Hypoalbuminemia  . MPGN (membranoproliferative glomerulonephritis), type 2  . Acute renal failure  . Dehydration  . C. difficile colitis  . Seizure disorder, grand mal  . Encephalopathy acute  . Renal failure (ARF), acute on chronic  . Cardiomyopathy  . Acute respiratory failure  . Pulmonary edema  . Hemorrhagic shock, due to intraabdominal bleeding following paracentesis  . Hypervolemia  . Ascites  . End stage renal disease    DISCHARGE MEDICATION:   Medication List     As of 02/24/2012  6:25 PM    STOP taking these medications         darbepoetin 60 MCG/0.3ML Soln   Commonly known as: ARANESP      divalproex 250 MG DR tablet   Commonly known as: DEPAKOTE      ferrous sulfate 325 (65 FE) MG tablet      furosemide 80 MG tablet   Commonly known as: LASIX      losartan 100 MG tablet   Commonly known as: COZAAR      sodium bicarbonate 650 MG tablet      TAKE these medications         carvedilol 12.5 MG tablet   Commonly known as: COREG   Take 1 tablet (12.5 mg total) by mouth 2 (two) times daily with a meal.      levETIRAcetam 250 MG tablet   Commonly known as: KEPPRA   Take 250 mg by mouth 2 (two) times daily.          Consults: Treatment Team:  Garnetta Buddy, MD   SIGNIFICANT DIAGNOSTIC STUDIES:  No results found.   ECHO:   Left ventricle: The cavity size was normal. There was mild to moderateconcentric hypertrophy. Systolic function was moderately to  severely reduced. The estimated ejection fraction was in the range of 30% to 35%. No regional wall motion abnormalities. - Mitral valve: Moderately calcified annulus. Mildly thickened leaflets . - Right ventricle: The cavity size was normal. Wall thickness was mildly increased. Systolic function was mildly to moderately reduced. - Atrial septum: No defect or patent foramen ovale was identified. - Pulmonary arteries: PA peak pressure: 32mm Hg (S). - Pericardium, extracardiac: A small pericardial effusion was identified circumferential to the heart. There was a left pleural effusion. Ascites was noted. Impressions:  - Compared to the previous study performed 10/13/11, there has been further impairment of LV and RV systolic function; pericardial effusion is somewhat larger, but remains small without evidence for hemodynamic compromise   CARDIAC CATH & OTHER PROCEDURES: -R [place] Permcath 9/6 >>  -L IJ TLC 9/8 >> 9/11  -ETT 9/7>>9/10  -R femoral Aline 9/7>>9/9   No results found for this or any previous visit (from the past 240 hour(s)).  BRIEF ADMITTING H & P: 21 yo woman, history of CHF (EF 30-35%), CKD (MPGN), presenting 9/5 with encephalopathy, found to have severe ascites and Depakote toxicity- s/p large-volume paracentesis 9/7- subsequently developed ?intra-abdominal bleeding  and hemorrhagic vs/ hypovolemic shock, requiring intubation, volume resuscitation, and CVVHD.  Pt has an extensive history- she developed cardio-pulmonary arrest on Jan 3 of 2013 and has since been declining- she has had a long stay at [facility] where she developed C.diff colitis in June and and then had a recurrence later this summer. She was diagnosed with MPGN and has been followed closely by [facility]. Her renal function has declined to a point where she now needs permanent dialysis. As noted above, she presented with significant fluid overload this admission. We are now waiting for CIR  admission.    Hospital Course:  Present on Admission:  . Encephalopathy acute: Felt to be multifactorial. CT scan of head without acute findings - ammonia level is within normal range - urinalysis is without suggestion of urinary tract infection - Depakote level was markedly elevated at presentation - perhaps this was primarily Depakote induced lethargy plus uremia - mental status is much improved the time of discharge.  . Renal failure (ARF), acute on chronic:  MPGN Type 2. Patient received dialysis per nephrology via tunneled dialysis catheter - Permanent access was planned during this hospitalization however the patient's mother declined PermCath placement to allow for her to complete rehabilitation. This will be further explored after the patient has completed her inpatient rehabilitation.  . Acute respiratory failure: intubated due to hemorrhagic shock, with respiratory status improved. Her sons the patient had a pneumonia. However all cultures were negative and radiologic studies were not consistent with pneumonia. The patient was successfully extubated and antibiotics were discontinued. Presently she is afebrile without any need for oxygen support.  . Cardiomyopathy: Ejection fraction 40-45% via echocardiogram January 2013 - repeat ECHO reveals progression of LV and RV systolic dysfunction - EF of 30-35% and moderate concentric hypertrophy of LV, mild to moderately reduced RV function -B blocker ordered - due to bradycardic episode, Coreg has been decreased - Added ACE I on 9/13   . Seizures disorder: Depakote has been discontinued due to toxicity and thrombocytopenia - the patient is currently stable on Keppra   . Ascites: Patient was found to have significant ascites and underwent a large volume paracentesis on 97 and subsequently developed intra-abdominal bleeding, anemia and hemorrhagic shock requiring intubation. Re-accumulation after large volume paracentesis - no signs of cirrhosis and  therefore likely due to progression of renal failure and hypoalbuminemia  .Hypovolemic Shock/Hypotension: Resolved. Likely due to fluid shifts from large volume paracentesis in addition to ?hemorrhagic shock- resolved with volume resuscitation/transfusion and now HTN is more of a problem  .Hypertension: Blood pressures currently controlled on carvedilol 12.5 mg.  . Anoxic brain damage s/p PEA arrest:March 26, 2011 secondary to cardio-respiratory failure from pulmonary edema as a result of acute renal failure   . CKD (chronic kidney disease)- new chronic HD start this admit  . Dysphagia: Patient has been closely followed by [facility] since her anoxic brain injury in January of 2013. Currently she is on mechanical soft diet with thin liquids.  . Thrombocytopenia: Suspected to be Depakote induced compounded by acute blood loss - her counts have now recovered  .Protein-calorie malnutrition-severe:Patient not tolerating solids due to lack of appetite - Suspect depression is playing a large part in this as well.    Disposition and Follow-up:  Patient is being discharged to inpatient rehabilitation.     Discharge Orders    Future Appointments: Provider: Department: Dept Phone: Center:   05/11/2012 10:20 AM Ranelle Oyster, MD [facility]  [phone] CPR     Future Orders Please Complete By Expires   Diet - low sodium heart healthy      Scheduling Instructions:   Renal diet   Activity as tolerated - No restrictions         DISCHARGE EXAM:  Patient has refused the physical examination this morning. However on observation the patient is well-appearing without any evidence of stress.  BP 126/84, HR 93, T 8.6 F (37 C), temperature source Oral,RR 16, height 5\' 2"  (1.575 m), weight 44.7 kg (98 lb 8.7 oz), last menstrual period 11/30/2011, SpO2 90.00%.  Total discharge process less than 30 minutes  Signed: Thedora Rings A. 02/24/2012, 6:25 PM

## 2012-04-05 ENCOUNTER — Other Ambulatory Visit (HOSPITAL_COMMUNITY): Payer: Self-pay | Admitting: Nephrology

## 2012-04-05 DIAGNOSIS — N186 End stage renal disease: Secondary | ICD-10-CM

## 2012-04-08 ENCOUNTER — Ambulatory Visit (HOSPITAL_COMMUNITY)
Admission: RE | Admit: 2012-04-08 | Discharge: 2012-04-08 | Disposition: A | Discharge status: Home / Self Care | Payer: Medicaid Other | Source: Ambulatory Visit | Attending: Nephrology | Admitting: Nephrology

## 2012-04-08 DIAGNOSIS — N186 End stage renal disease: Secondary | ICD-10-CM

## 2012-04-08 DIAGNOSIS — Z452 Encounter for adjustment and management of vascular access device: Secondary | ICD-10-CM | POA: Insufficient documentation

## 2012-04-08 MED ORDER — CHLORHEXIDINE GLUCONATE 4 % EX LIQD
CUTANEOUS | Status: AC
  Filled 2012-04-08: qty 30

## 2012-04-08 NOTE — Procedures (Signed)
Successful removal of Rt IJ tunneled HD catheter No complications.  Yoshiharu Brassell PA-C 

## 2012-04-12 ENCOUNTER — Other Ambulatory Visit: Payer: Self-pay | Admitting: Physical Medicine & Rehabilitation

## 2012-05-11 ENCOUNTER — Encounter: Payer: Self-pay | Admitting: Physical Medicine & Rehabilitation

## 2012-05-11 ENCOUNTER — Telehealth: Payer: Self-pay | Admitting: Physical Medicine & Rehabilitation

## 2012-05-11 ENCOUNTER — Encounter: Payer: Medicaid Other | Attending: Physical Medicine & Rehabilitation | Admitting: Physical Medicine & Rehabilitation

## 2012-05-11 VITALS — BP 100/64 | HR 96 | Resp 15 | Ht 62.0 in | Wt 109.2 lb

## 2012-05-11 DIAGNOSIS — N19 Unspecified kidney failure: Secondary | ICD-10-CM | POA: Insufficient documentation

## 2012-05-11 DIAGNOSIS — R269 Unspecified abnormalities of gait and mobility: Secondary | ICD-10-CM | POA: Insufficient documentation

## 2012-05-11 DIAGNOSIS — D696 Thrombocytopenia, unspecified: Secondary | ICD-10-CM | POA: Insufficient documentation

## 2012-05-11 DIAGNOSIS — G931 Anoxic brain damage, not elsewhere classified: Secondary | ICD-10-CM

## 2012-05-11 DIAGNOSIS — R569 Unspecified convulsions: Secondary | ICD-10-CM | POA: Insufficient documentation

## 2012-05-11 DIAGNOSIS — D649 Anemia, unspecified: Secondary | ICD-10-CM | POA: Insufficient documentation

## 2012-05-11 DIAGNOSIS — G253 Myoclonus: Secondary | ICD-10-CM | POA: Insufficient documentation

## 2012-05-11 DIAGNOSIS — X58XXXA Exposure to other specified factors, initial encounter: Secondary | ICD-10-CM | POA: Insufficient documentation

## 2012-05-11 DIAGNOSIS — N186 End stage renal disease: Secondary | ICD-10-CM

## 2012-05-11 DIAGNOSIS — I10 Essential (primary) hypertension: Secondary | ICD-10-CM | POA: Insufficient documentation

## 2012-05-11 DIAGNOSIS — Z992 Dependence on renal dialysis: Secondary | ICD-10-CM

## 2012-05-11 MED ORDER — CLONAZEPAM 0.5 MG PO TABS: 0.5000 mg | ORAL_TABLET | Freq: Two times a day (BID) | ORAL | Status: DC

## 2012-05-11 NOTE — Telephone Encounter (Signed)
She can go to [facility] neuro rehab for land therapy. Ultimately, i'm not sure how many visits she has regardless.

## 2012-05-11 NOTE — Progress Notes (Signed)
Subjective:    Patient ID: Heather Sims, female    DOB: 08/09/1990, 22 y.o.   MRN: [id number]  HPI   Heather Sims is back regarding her gait disorder. She continues to have myoclonus/tremors but they are controlled with her klonopin. She has problems on HD days when her klonopin is washed out.   She is still using her walker. She has increased her mobility and independence at home. She would like to come off the walker. They are doing better with balancing the volume they are taking off with HD session.  She remains on keppra for seizure prophylaxis. She has had no further seizures.    Pain Inventory Average Pain 0 Pain Right Now 0 My pain is no pain  In the last 24 hours, has pain interfered with the following? General activity 4 Relation with others 5 Enjoyment of life 3 What TIME of day is your pain at its worst? no pain Sleep (in general) Good  Pain is worse with: no pain Pain improves with: no pain Relief from Meds: no pain meds  Mobility use a walker ability to climb steps?  yes do you drive?  no  Function disabled: date disabled see chart I need assistance with the following:  household duties and shopping  Neuro/Psych tremor  Prior Studies Any changes since last visit?  no  Physicians involved in your care Any changes since last visit?  no   Family History  Problem Relation Age of Onset  . Hypertension Mother   . Diabetes Father   . Hyperlipidemia Father    History   Social History  . Marital Status: Single    Spouse Name: N/A    Number of Children: N/A  . Years of Education: N/A   Social History Main Topics  . Smoking status: Never Smoker   . Smokeless tobacco: Never Used  . Alcohol Use: No  . Drug Use: No  . Sexually Active: No   Other Topics Concern  . None   Social History Narrative   ** Merged History Encounter **       Past Surgical History  Procedure Laterality Date  . Renal biopsy    . Av fistula placement  12/18/2011     Procedure: ARTERIOVENOUS (AV) FISTULA CREATION;  Surgeon: Chuck Hint, MD;  Location: [facility] OR;  Service: Vascular;  Laterality: Left;   Past Medical History  Diagnosis Date  . Pneumonia     'walking pneumonia'  . Seizures   . Anemia   . Heart attack   . Cardiomyopathy   . Heart failure   . CHF (congestive heart failure)   . Ileitis   . Chronic kidney disease   . Renal disorder   . MPGN (membranoproliferative glomerulonephritis), type 2   . Hypertension   . Brain injury    BP 100/64  Pulse 96  Resp 15  Ht 5\' 2"  (1.575 m)  Wt 109 lb 3.2 oz (49.533 kg)  BMI 19.97 kg/m2  SpO2 100%     Review of Systems  All other systems reviewed and are negative.       Objective:   Physical Exam Constitutional: She is oriented to person, place, and time. She appears well-developed and well-nourished.  HENT:  Head: Normocephalic and atraumatic.  Eyes: Conjunctivae and EOM are normal. Pupils are equal, round, and reactive to light.  Neck: Normal range of motion.  Cardiovascular: Normal rate and regular rhythm.  Pulmonary/Chest: Effort normal and breath sounds normal.  Neurological: She  is alert and oriented to person, place, and time. A sensory deficit is present. No cranial nerve deficit.    Strength in the legs is grossly 4+/5 in UE's. She is 4/5 LE'S. No myoclonus was seen today at rest but it did present with MMT and gait. Her legs became shaky when she walked. She had difficulty walking more than a few steps when i removed the walker and held on to her.. Insight and awareness are much im;proved..  Skin: Skin is warm.  Psychiatric: sometimes inappropriately laughing and singing while in the room. Easily distracted.    Assessment & Plan:   1. 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 Myoclonus.  3. Seizure disorder- no further since discharge  4. Renal failure- she follows up with nephrology on April 17  5. Hypertension. Remains borderline. No changes today   6. Anemia.  7. Thrombocytopenia - improved.  8. Mood -much improved    Plan:  1. Will make a referral to aquatic therapy to further work on gait. She may be limited by her MCD in the total amount of visits 2. Maintain klonopin to 0.5mg  at bid to help with myoclonus. She may want to split her AM dose on HD days or perhaps take the 0.5mg  tid on those days  3. Continue with depakote for seizure prophylaxis  4. Renal follow up per [facility] with HD ongoing. She is concerned that they are taking off too much volume, and I told her she should follow up with [facility] regarding this concern.  5. Follow up with me in about 4 months. 30 minutes of face to face patient care time were spent during this visit. All questions were encouraged and answered.

## 2012-05-11 NOTE — Patient Instructions (Signed)
TRY SPLITTING YOUR KLONOPIN TO 0.25 MG BEFORE AND AFTER HEMODIALYSIS ON DIALYSIS DAYS. CONTINUE WITH THE 0.5MG  AT NIGHT TIME

## 2012-05-11 NOTE — Telephone Encounter (Signed)
[facility] does not take medicaid either.

## 2012-05-11 NOTE — Telephone Encounter (Signed)
I have checked [facility] for aquatic therapy.  They do not accept Medicaid patients.  What would you suggest?

## 2012-05-17 ENCOUNTER — Telehealth: Payer: Self-pay

## 2012-05-17 NOTE — Telephone Encounter (Signed)
Patients mother called and requests clonazepam refill.  She said this was supposed to be done at last visit but pharmacy does not have record of this.  This medication was called in 05/11/12 with 4 refills.  Pharmacy did not put refills on it but it has 3 refills at this point.  Left message informing patients mother of this.

## 2012-06-22 ENCOUNTER — Ambulatory Visit: Payer: Medicaid Other | Attending: Physical Medicine & Rehabilitation | Admitting: Physical Therapy

## 2012-06-22 DIAGNOSIS — R269 Unspecified abnormalities of gait and mobility: Secondary | ICD-10-CM | POA: Insufficient documentation

## 2012-06-22 DIAGNOSIS — IMO0001 Reserved for inherently not codable concepts without codable children: Secondary | ICD-10-CM | POA: Insufficient documentation

## 2012-07-01 ENCOUNTER — Ambulatory Visit: Payer: Medicaid Other | Admitting: Physical Therapy

## 2012-07-04 ENCOUNTER — Ambulatory Visit: Payer: Medicaid Other | Admitting: Physical Therapy

## 2012-07-20 ENCOUNTER — Ambulatory Visit: Payer: Medicaid Other | Admitting: Physical Therapy

## 2012-07-27 ENCOUNTER — Ambulatory Visit: Payer: Medicaid Other | Attending: Physical Medicine & Rehabilitation | Admitting: Physical Therapy

## 2012-07-27 DIAGNOSIS — IMO0001 Reserved for inherently not codable concepts without codable children: Secondary | ICD-10-CM | POA: Insufficient documentation

## 2012-07-27 DIAGNOSIS — R269 Unspecified abnormalities of gait and mobility: Secondary | ICD-10-CM | POA: Insufficient documentation

## 2012-08-01 ENCOUNTER — Telehealth: Payer: Self-pay | Admitting: Nurse Practitioner

## 2012-08-05 ENCOUNTER — Encounter: Payer: Medicaid Other | Attending: Physical Medicine & Rehabilitation | Admitting: Physical Medicine & Rehabilitation

## 2012-08-05 ENCOUNTER — Encounter: Payer: Self-pay | Admitting: Physical Medicine & Rehabilitation

## 2012-08-05 VITALS — BP 110/73 | HR 101 | Resp 14 | Ht 62.0 in | Wt 121.0 lb

## 2012-08-05 DIAGNOSIS — X58XXXA Exposure to other specified factors, initial encounter: Secondary | ICD-10-CM | POA: Insufficient documentation

## 2012-08-05 DIAGNOSIS — G253 Myoclonus: Secondary | ICD-10-CM | POA: Insufficient documentation

## 2012-08-05 DIAGNOSIS — N186 End stage renal disease: Secondary | ICD-10-CM

## 2012-08-05 DIAGNOSIS — G931 Anoxic brain damage, not elsewhere classified: Secondary | ICD-10-CM | POA: Insufficient documentation

## 2012-08-05 NOTE — Progress Notes (Signed)
Subjective:    Patient ID: Heather Sims, female    DOB: 04/20/1990, 22 y.o.   MRN: [id number]  HPI  Ms Priore is back regarding her gait disorder. She was unable to get to aquatic therapies because of her medicaid limitations. Myoclonus is generally under control with medications. She has done some walking at home.   She often takes her medicines right before HD, and on those days her myoclonus is worse.   Pain Inventory Average Pain 0 Pain Right Now 0 My pain is n/a  In the last 24 hours, has pain interfered with the following? General activity 0 Relation with others 0 Enjoyment of life 0 What TIME of day is your pain at its worst? n/a Sleep (in general) Fair  Pain is worse with: n/a Pain improves with: n/a Relief from Meds: n/a  Mobility walk with assistance use a walker how many minutes can you walk? long ability to climb steps?  yes do you drive?  yes Do you have any goals in this area?  yes  Function disabled: date disabled 2013 I need assistance with the following:  household duties and shopping Do you have any goals in this area?  yes  Neuro/Psych tremor  Prior Studies Any changes since last visit?  no  Physicians involved in your care Any changes since last visit?  no   Family History  Problem Relation Age of Onset  . Hypertension Mother   . Diabetes Father   . Hyperlipidemia Father    History   Social History  . Marital Status: Single    Spouse Name: N/A    Number of Children: N/A  . Years of Education: N/A   Social History Main Topics  . Smoking status: Never Smoker   . Smokeless tobacco: Never Used  . Alcohol Use: No  . Drug Use: No  . Sexually Active: No   Other Topics Concern  . None   Social History Narrative   ** Merged History Encounter **       Past Surgical History  Procedure Laterality Date  . Renal biopsy    . Av fistula placement  12/18/2011    Procedure: ARTERIOVENOUS (AV) FISTULA CREATION;  Surgeon:  Chuck Hint, MD;  Location: [facility] OR;  Service: Vascular;  Laterality: Left;   Past Medical History  Diagnosis Date  . Pneumonia     'walking pneumonia'  . Seizures   . Anemia   . Heart attack   . Cardiomyopathy   . Heart failure   . CHF (congestive heart failure)   . Ileitis   . Chronic kidney disease   . Renal disorder   . MPGN (membranoproliferative glomerulonephritis), type 2   . Hypertension   . Brain injury    BP 110/73  Pulse 101  Resp 14  Ht 5\' 2"  (1.575 m)  Wt 121 lb (54.885 kg)  BMI 22.13 kg/m2  SpO2 98%  LMP 01/06/2012     Review of Systems  Neurological: Positive for tremors.  All other systems reviewed and are negative.       Objective:   Physical Exam Constitutional: She is oriented to person, place, and time. She appears well-developed and well-nourished.  HENT:  Head: Normocephalic and atraumatic.  Eyes: Conjunctivae and EOM are normal. Pupils are equal, round, and reactive to light.  Neck: Normal range of motion.  Cardiovascular: Normal rate and regular rhythm.  Pulmonary/Chest: Effort normal and breath sounds normal.  Neurological: She is alert and oriented to  person, place, and time. A sensory deficit is present. No cranial nerve deficit.    Strength in the legs is grossly 4+/5 in UE's. She is 4/5 LE'S. No myoclonus was seen today at rest but it did present with MMT and gait. Gait much improved. Slightly wide based. Turns too quickly. Stands from sitting easily.  Insight and awareness are much im;proved..  Skin: Skin is warm.  Psychiatric: sometimes inappropriately laughing and singing while in the room. Easily distracted.  Assessment & Plan:   1. 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 Myoclonus.  3. Seizure disorder- no further since discharge  4. Renal failure- she follows up with nephrology on April 17  5. Hypertension. Remains borderline. No changes today  6. Anemia.  7. Thrombocytopenia - improved.  8. Mood  -much improved    Plan:  1. Will make a referral to outpt neurorehab to further improve balance. We also discussed the possibility of self-directed walking or activities in the pool as we head to summer.  2. Maintain klonopin to 0.5mg  at bid to help with myoclonus. We discussed the fact that she shouldn't take ANY of her meds right before HD as they will be lost essentially. Discussed 2 ways she could better organize her meds.  3. Continue with keppra for seizure prophylaxis  4. Renal follow up per [facility] with HD ongoing.   5. Follow up with me in about 6 months. 30 minutes of face to face patient care time were spent during this visit. All questions were encouraged and answered.

## 2012-08-05 NOTE — Patient Instructions (Addendum)
DIRECTIONS FOR KEPPRA, KLONOPIN AND OTHER MEDS:  IF YOU WAKE UP AND GO RIGHT TO HEMODIALYSIS, TAKE YOUR MEDS AFTER YOU'RE DONE.  IF YOU WAKE UP SEVERAL HOURS BEFORE HEMODIALYSIS THEN TAKE YOUR MEDS BEFORE HEMO   CALL ME WITH ANY PROBLEMS OR QUESTIONS (#[phone]).  HAVE A GOOD DAY

## 2012-09-05 ENCOUNTER — Ambulatory Visit: Payer: Self-pay | Admitting: Nurse Practitioner

## 2012-09-20 ENCOUNTER — Other Ambulatory Visit: Payer: Self-pay | Admitting: Physical Medicine & Rehabilitation

## 2012-09-21 ENCOUNTER — Telehealth: Payer: Self-pay

## 2012-09-21 NOTE — Telephone Encounter (Signed)
Patient request clonazepam refill.  Please advise.

## 2012-09-21 NOTE — Telephone Encounter (Signed)
May refill x 3. thx

## 2012-09-22 MED ORDER — CLONAZEPAM 0.5 MG PO TABS: ORAL_TABLET | ORAL | Status: DC

## 2012-09-22 NOTE — Telephone Encounter (Signed)
Clonazepam called into [facility].  Patient mother informed.

## 2012-12-05 ENCOUNTER — Other Ambulatory Visit: Payer: Self-pay | Admitting: Neurology

## 2013-01-10 ENCOUNTER — Encounter: Payer: Self-pay | Admitting: Neurology

## 2013-01-11 ENCOUNTER — Encounter (INDEPENDENT_AMBULATORY_CARE_PROVIDER_SITE_OTHER): Payer: Self-pay

## 2013-01-11 ENCOUNTER — Ambulatory Visit (INDEPENDENT_AMBULATORY_CARE_PROVIDER_SITE_OTHER): Payer: Medicaid Other | Admitting: Neurology

## 2013-01-11 ENCOUNTER — Encounter: Payer: Self-pay | Admitting: Neurology

## 2013-01-11 VITALS — BP 108/73 | HR 108 | Ht 62.0 in | Wt 129.0 lb

## 2013-01-11 DIAGNOSIS — R269 Unspecified abnormalities of gait and mobility: Secondary | ICD-10-CM

## 2013-01-11 DIAGNOSIS — R569 Unspecified convulsions: Secondary | ICD-10-CM

## 2013-01-11 HISTORY — DX: Unspecified abnormalities of gait and mobility: R26.9

## 2013-01-11 MED ORDER — LEVETIRACETAM 250 MG PO TABS: 250.0000 mg | ORAL_TABLET | Freq: Two times a day (BID) | ORAL | Status: DC

## 2013-01-11 NOTE — Progress Notes (Signed)
Reason for visit: Seizures  Heather Sims is an 22 y.o. female  History of present illness:  Heather Sims is a 22 year old right-handed black female with a history of a cardiac arrest and an anoxic brain injury. The patient has been on Keppra for seizures, and she does quite well with this. The patient has a chronic gait disorder, and she has end-stage renal disease on hemodialysis. The patient uses a walker for ambulation, and she has not had any falls. The patient reports no further seizures since last seen in December 2013. The patient is tolerating the medication fairly well. The patient will use clonazepam for myoclonus. The patient has significant ataxia with the lower extremities.  Past Medical History  Diagnosis Date  . Pneumonia     'walking pneumonia'  . Seizures   . Anemia   . Heart attack   . Cardiomyopathy   . Heart failure   . CHF (congestive heart failure)   . Ileitis   . Chronic kidney disease   . Renal disorder   . MPGN (membranoproliferative glomerulonephritis), type 2   . Hypertension   . Brain injury   . Gait disorder 01/11/2013    Past Surgical History  Procedure Laterality Date  . Renal biopsy    . Av fistula placement  12/18/2011    Procedure: ARTERIOVENOUS (AV) FISTULA CREATION;  Surgeon: Chuck Hint, MD;  Location: [facility] OR;  Service: Vascular;  Laterality: Left;    Family History  Problem Relation Age of Onset  . Hypertension Mother   . Diabetes Father   . Hyperlipidemia Father     Social history:  reports that she has never smoked. She has never used smokeless tobacco. She reports that she does not drink alcohol or use illicit drugs.    Allergies  Allergen Reactions  . Pollen Extract Swelling    Medications:  Current Outpatient Prescriptions on File Prior to Visit  Medication Sig Dispense Refill  . acetaminophen (TYLENOL) 325 MG tablet Take 2 tablets (650 mg total) by mouth every 4 (four) hours as needed (pain).      .  carvedilol (COREG) 12.5 MG tablet Take 1 tablet (12.5 mg total) by mouth 2 (two) times daily with a meal.      . clonazePAM (KLONOPIN) 0.5 MG tablet TAKE ONE TABLET BY MOUTH TWICE DAILY  60 tablet  3  . multivitamin (RENA-VIT) TABS tablet Take 1 tablet by mouth at bedtime.  30 tablet  1  . sevelamer carbonate (RENVELA) 800 MG tablet Take 800 mg by mouth 3 (three) times daily with meals.       No current facility-administered medications on file prior to visit.    ROS:  Out of a complete 14 system review of symptoms, the patient complains only of the following symptoms, and all other reviewed systems are negative.  Headache History of seizures Depression, anxiety  Blood pressure 108/73, pulse 108, height 5\' 2"  (1.575 m), weight 129 lb (58.514 kg).  Physical Exam  General: The patient is alert and cooperative at the time of the examination. Affect is somewhat flat.  Skin: No significant peripheral edema is noted.   Neurologic Exam  Mental status: The patient is oriented x 3.  Cranial nerves: Facial symmetry is present. Speech is normal, no aphasia or dysarthria is noted. Extraocular movements are full. Visual fields are full.  Motor: The patient has good strength in all 4 extremities.  Sensory examination: Soft touch sensation is symmetric on the face, arms,  and legs.  Coordination: The patient has good finger-nose-finger bilaterally, but the patient has dysmetria with heel-to-shin bilaterally.  Gait and station: The patient has a slightly wide-based, very unsteady gait. The patient ambulates with good stride by using a walker. Tandem gait was not attempted. Orthostatic type tremor was seen with the legs while standing. Romberg is negative. No drift is seen.  Reflexes: Deep tendon reflexes are symmetric.   Assessment/Plan:  1. Anoxic encephalopathy  2. History seizures  3. Gait disturbance  4. End-stage renal disease on hemodialysis  The patient is on low-dose  Keppra, and she is doing well with this. The patient will continue the medication for now. The patient will followup in one year. A prescription was given for the Keppra.  Marlan Palau MD 01/11/2013 7:35 PM  [facility] [address] [place], [place] [address]  Phone [phone] Fax [phone]

## 2013-01-19 IMAGING — CR DG CHEST 1V PORT
1 series · 1 of 1 positions shown · non-contrast
Comparison: 03/26/2010

CLINICAL DATA: Ventilator dependent

PORTABLE CHEST - 1 VIEW

[AP]
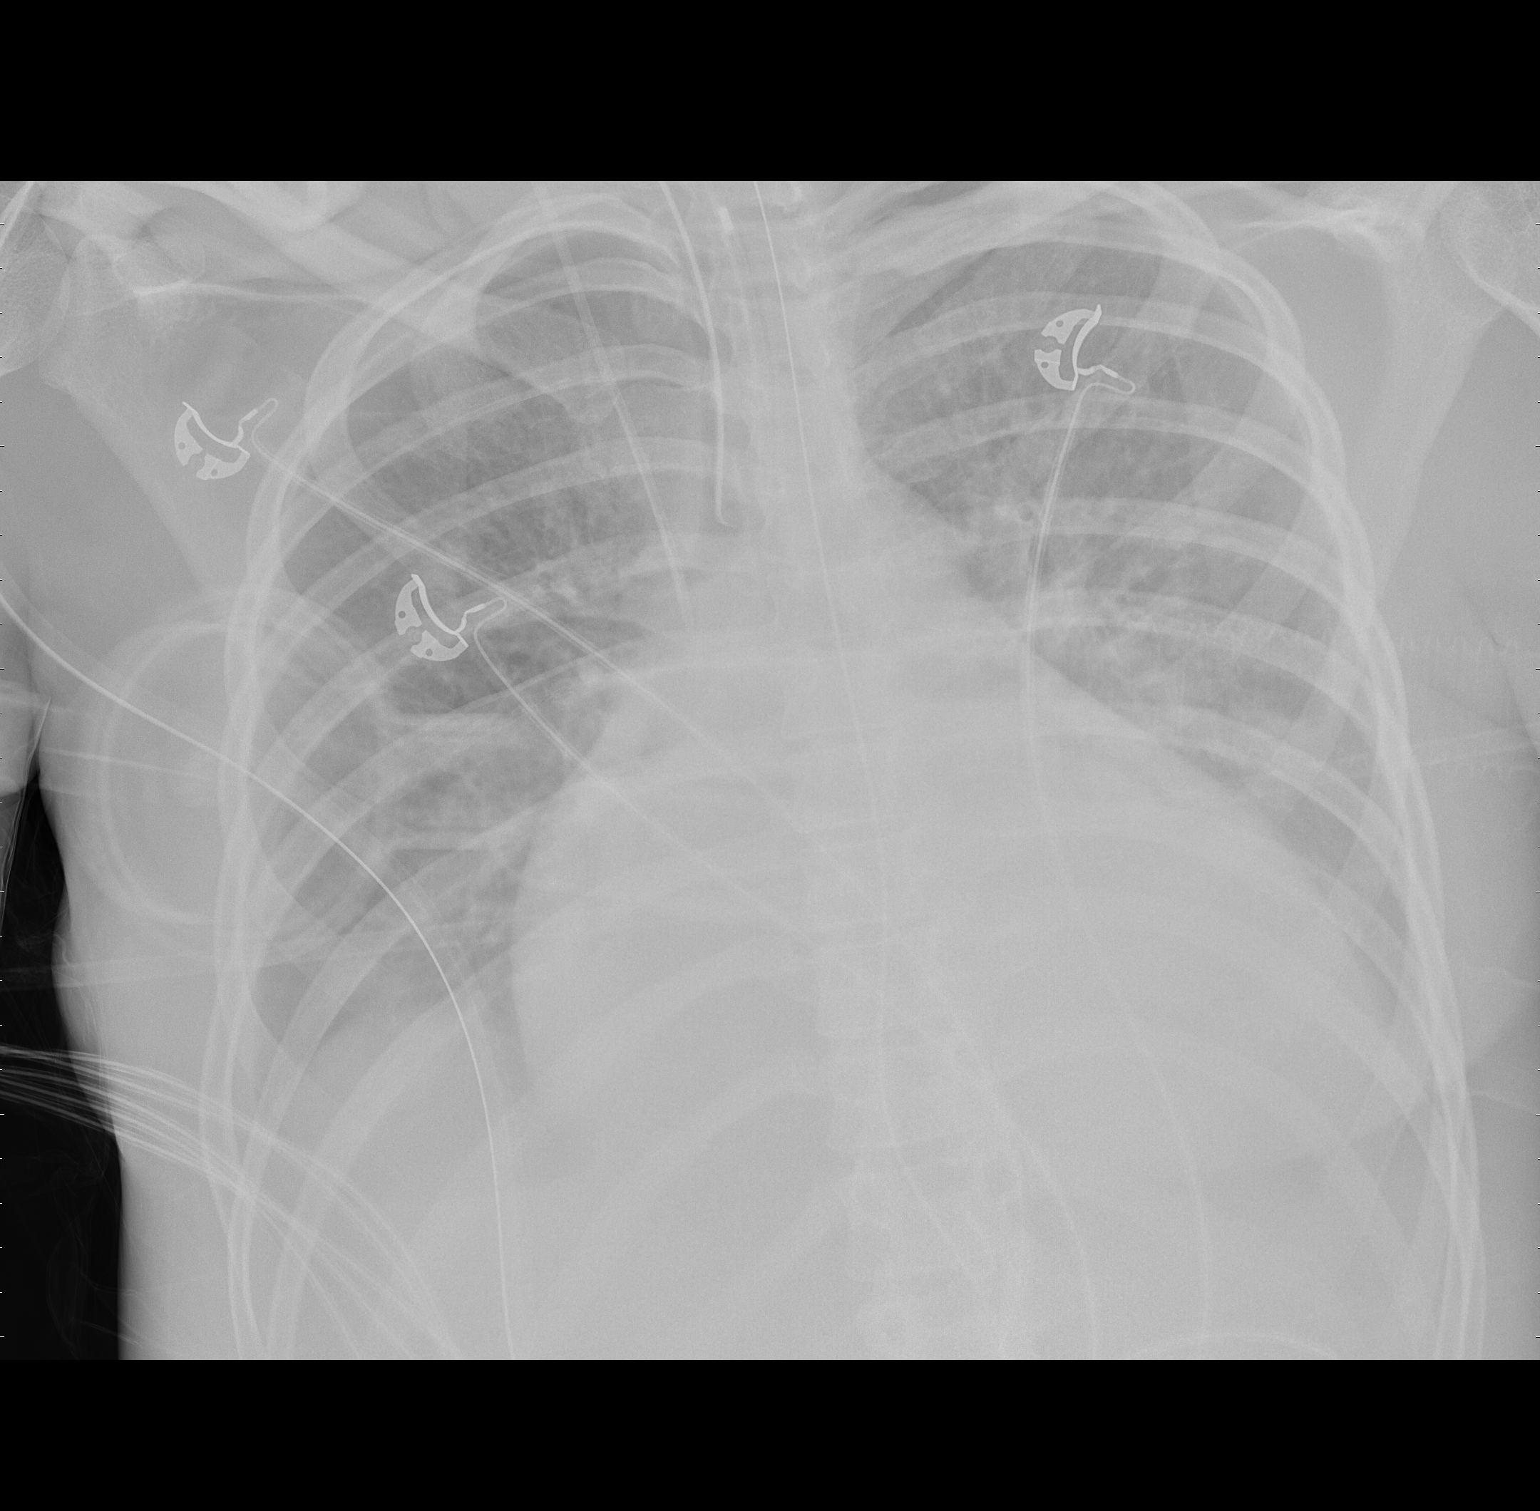

[1 of 1 positions shown; findings below may reference images not displayed]

FINDINGS: Endotracheal tube terminates 2.5 cm above the carina.

Stable right IJ venous catheter and enteric tube.

Mild interstitial edema with probable bilateral pleural effusions,
improved.

Stable moderate cardiomegaly.
IMPRESSION: Endotracheal tube terminates 2.5 cm above the carina.

Stable moderate cardiomegaly.

Mild interstitial edema with probable bilateral pleural effusions,
improved.

## 2013-01-20 IMAGING — CR DG CHEST 1V PORT
1 series · 1 of 1 positions shown · non-contrast
Comparison: 03/28/2011

CLINICAL DATA: Antegrade, pneumonia

PORTABLE CHEST - 1 VIEW

[AP]
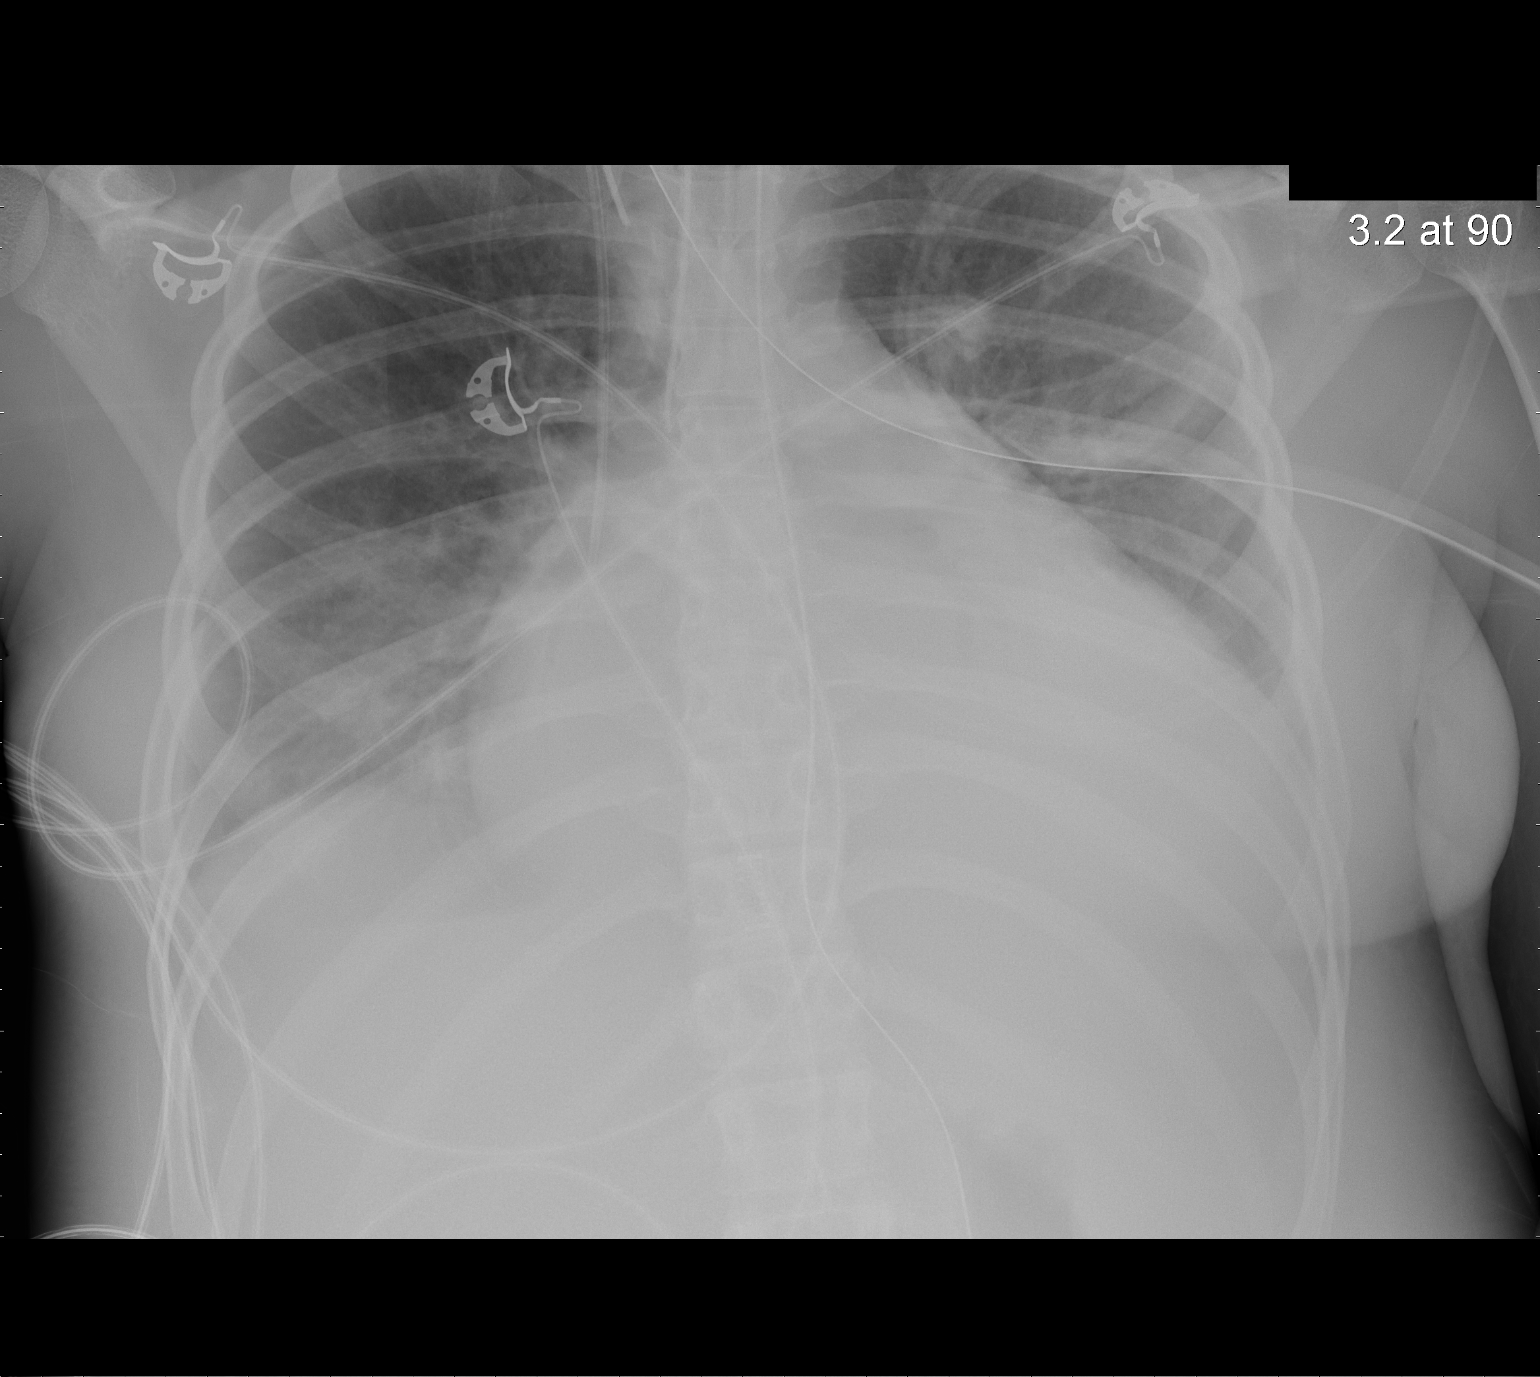

[1 of 1 positions shown; findings below may reference images not displayed]

FINDINGS: Endotracheal tube at the carina directed to the right
main stem.  This should be retracted 3 cm.  NG tube in the stomach.
Right IJ central line in the SVC region.  Heart remains enlarged
with dense left lower lobe consolidation/collapse.  No significant
interval change.  No pneumothorax.
IMPRESSION: Low endotracheal tube at the carina directed to the right main stem
should be retracted 3 cm.  Otherwise stable chest exam.

Findings called to [facility], patient's nurse on [phone].

## 2013-01-21 IMAGING — CR DG CHEST 1V PORT
1 series · 1 of 1 positions shown · non-contrast
Comparison: 03/29/2011

CLINICAL DATA: Intubated.

PORTABLE CHEST - 1 VIEW

[AP]
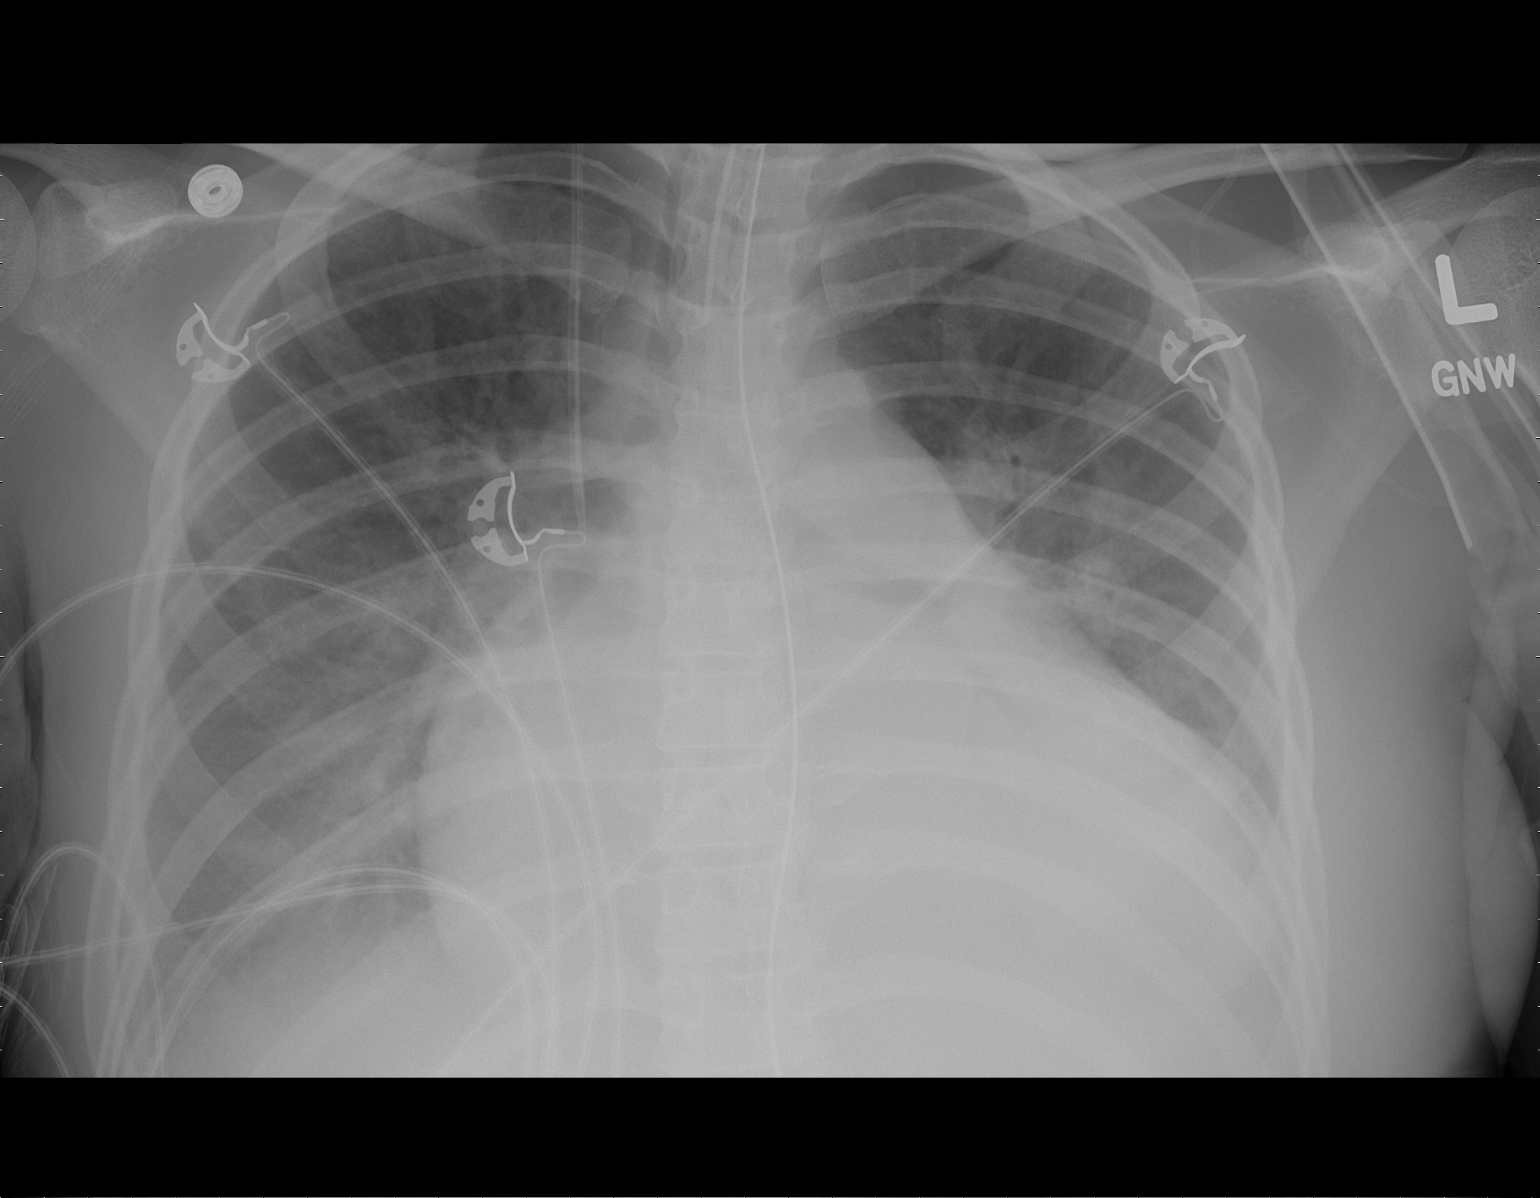

[1 of 1 positions shown; findings below may reference images not displayed]

FINDINGS: Support devices are unchanged.  There is cardiomegaly.
Bilateral lower lobe airspace opacities, left greater than right
suspected small bilateral effusions.  No real change since prior
study.
IMPRESSION: No interval change.

## 2013-01-22 IMAGING — CR DG CHEST 1V PORT
1 series · 1 of 1 positions shown · non-contrast
Comparison: 03/30/2011.

CLINICAL DATA: Endotracheal tube placement.  Intubated patient.

PORTABLE CHEST - 1 VIEW

[AP]
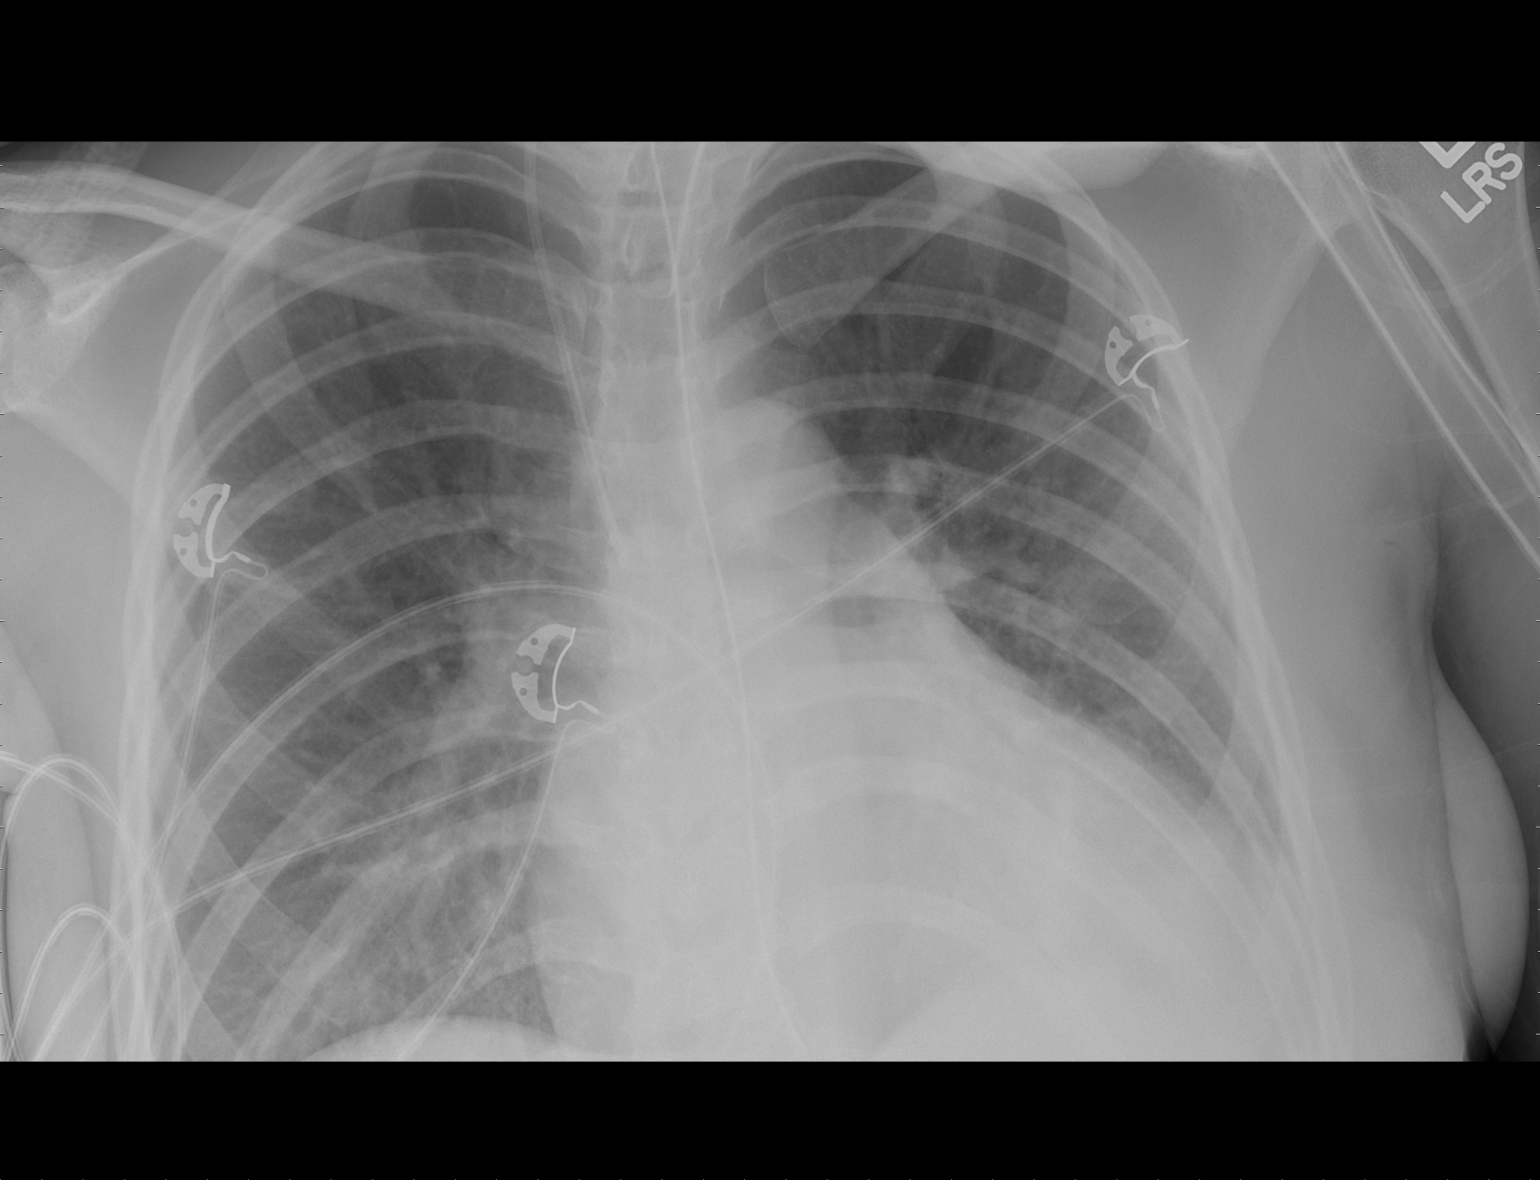

[1 of 1 positions shown; findings below may reference images not displayed]

FINDINGS: Endotracheal tube 54 mm from the carina.  Right IJ
central line appears unchanged.  Enteric tube is present with the
tip not visualized.  Cardiomegaly.  Patient rotated to the left.
Basilar atelectasis remains present.  Improved pulmonary aeration
with less pulmonary edema than was present on yesterday's
examination.
IMPRESSION: 1.  Stable support apparatus.
2.  Improving pulmonary aeration.
3.  Unchanged cardiomegaly.

## 2013-01-25 IMAGING — CR DG CHEST 1V PORT
1 series · 1 of 1 positions shown · non-contrast
Comparison: 04/02/2011

CLINICAL DATA: Respiratory failure.

PORTABLE CHEST - 1 VIEW

[AP]
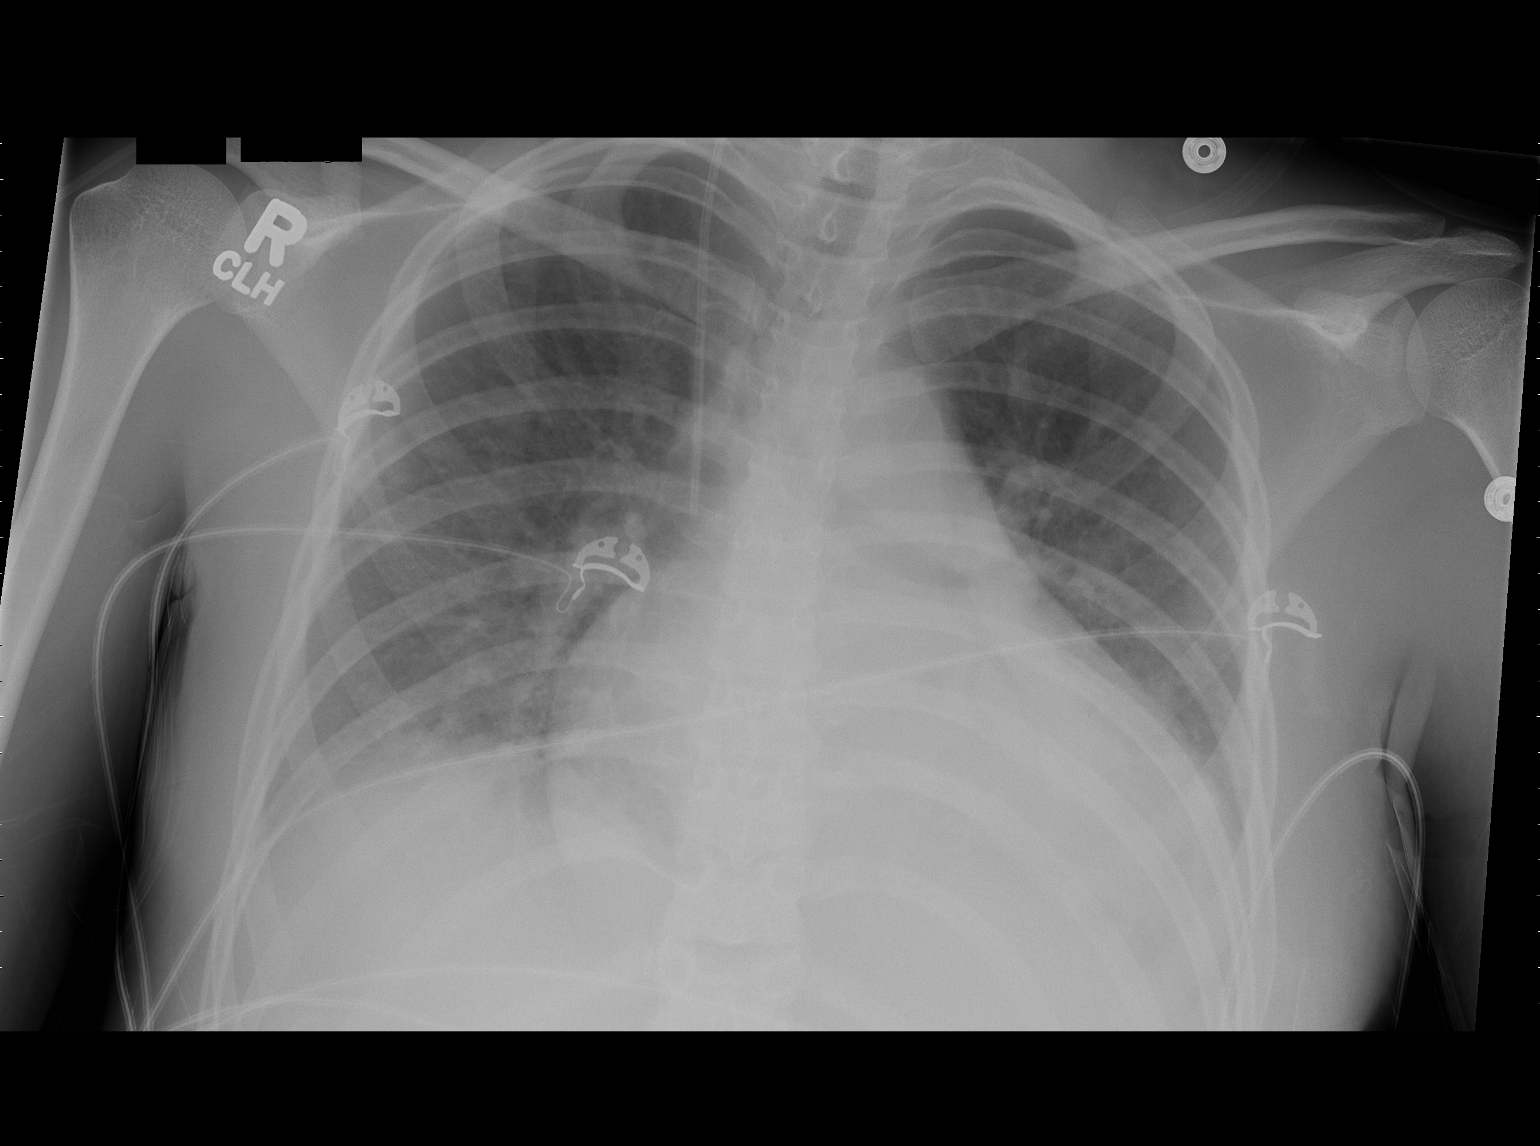

[1 of 1 positions shown; findings below may reference images not displayed]

FINDINGS: Interval extubation and removal of NG tube.  Right
central line is unchanged.  Stable cardiomegaly.  Increasing
bibasilar opacities, likely atelectasis.  Possible small bilateral
effusions.  Mild vascular congestion and interstitial prominence
throughout the lungs, likely mild edema.
IMPRESSION: Interval extubation.  Continued mild edema.  Increasing bibasilar
atelectasis following extubation.

## 2013-02-01 ENCOUNTER — Encounter: Payer: Self-pay | Admitting: Physical Medicine & Rehabilitation

## 2013-02-01 ENCOUNTER — Encounter: Payer: Medicaid Other | Attending: Physical Medicine & Rehabilitation | Admitting: Physical Medicine & Rehabilitation

## 2013-02-01 VITALS — BP 102/75 | HR 100 | Resp 14 | Ht 62.0 in | Wt 129.0 lb

## 2013-02-01 DIAGNOSIS — I129 Hypertensive chronic kidney disease with stage 1 through stage 4 chronic kidney disease, or unspecified chronic kidney disease: Secondary | ICD-10-CM | POA: Insufficient documentation

## 2013-02-01 DIAGNOSIS — R569 Unspecified convulsions: Secondary | ICD-10-CM | POA: Insufficient documentation

## 2013-02-01 DIAGNOSIS — G253 Myoclonus: Secondary | ICD-10-CM | POA: Insufficient documentation

## 2013-02-01 DIAGNOSIS — X58XXXA Exposure to other specified factors, initial encounter: Secondary | ICD-10-CM | POA: Insufficient documentation

## 2013-02-01 DIAGNOSIS — D649 Anemia, unspecified: Secondary | ICD-10-CM | POA: Insufficient documentation

## 2013-02-01 DIAGNOSIS — G931 Anoxic brain damage, not elsewhere classified: Secondary | ICD-10-CM | POA: Insufficient documentation

## 2013-02-01 DIAGNOSIS — Z8674 Personal history of sudden cardiac arrest: Secondary | ICD-10-CM | POA: Insufficient documentation

## 2013-02-01 DIAGNOSIS — D696 Thrombocytopenia, unspecified: Secondary | ICD-10-CM | POA: Insufficient documentation

## 2013-02-01 DIAGNOSIS — N189 Chronic kidney disease, unspecified: Secondary | ICD-10-CM | POA: Insufficient documentation

## 2013-02-01 MED ORDER — CLONAZEPAM 0.5 MG PO TABS: ORAL_TABLET | ORAL | Status: DC

## 2013-02-01 NOTE — Patient Instructions (Signed)
CONTINUE TO WORK ON YOUR LOWER EXTREMITY COORDINATION AND STRENGTH.

## 2013-02-01 NOTE — Progress Notes (Signed)
Subjective:    Patient ID: Heather Sims, female    DOB: 1990/06/11, 22 y.o.   MRN: [id number]  HPI  Nira is back regarding her anoxic injury and gait disorder. She has been going to the aquatic center for the last few months since I last saw her. She has been working on strength and balance.   She still uses a walker when she's outside of the house. She will use a cane at home or in smaller spaces where she can hold on to things.   From a standpoint of her myoclonus it has been fairly controlled. She still has some episodes of myoclonus after HD or early in the am.   She still undergoes HD per [facility]. There have been no changes there.   Pain Inventory Average Pain 0 Pain Right Now 0 My pain is n/a  In the last 24 hours, has pain interfered with the following? General activity 0 Relation with others 0 Enjoyment of life 0 What TIME of day is your pain at its worst? n/a Sleep (in general) Fair  Pain is worse with: some activites Pain improves with: pacing activities Relief from Meds: n/a  Mobility use a walker Do you have any goals in this area?  yes  Function disabled: date disabled . Do you have any goals in this area?  yes  Neuro/Psych tremor spasms depression  Prior Studies Any changes since last visit?  no  Physicians involved in your care Any changes since last visit?  no   Family History  Problem Relation Age of Onset  . Hypertension Mother   . Diabetes Father   . Hyperlipidemia Father    History   Social History  . Marital Status: Single    Spouse Name: N/A    Number of Children: 0  . Years of Education: college jr   Social History Main Topics  . Smoking status: Never Smoker   . Smokeless tobacco: Never Used  . Alcohol Use: No  . Drug Use: No  . Sexual Activity: No   Other Topics Concern  . None   Social History Narrative   ** Merged History Encounter **       Past Surgical History  Procedure Laterality Date  . Renal biopsy     . Av fistula placement  12/18/2011    Procedure: ARTERIOVENOUS (AV) FISTULA CREATION;  Surgeon: Chuck Hint, MD;  Location: [facility] OR;  Service: Vascular;  Laterality: Left;   Past Medical History  Diagnosis Date  . Pneumonia     'walking pneumonia'  . Seizures   . Anemia   . Heart attack   . Cardiomyopathy   . Heart failure   . CHF (congestive heart failure)   . Ileitis   . Chronic kidney disease   . Renal disorder   . MPGN (membranoproliferative glomerulonephritis), type 2   . Hypertension   . Brain injury   . Gait disorder 01/11/2013   BP 102/75  Pulse 100  Resp 14  Ht 5\' 2"  (1.575 m)  Wt 129 lb (58.514 kg)  BMI 23.59 kg/m2  SpO2 100%     Review of Systems  Neurological: Positive for tremors.       Spasms  Psychiatric/Behavioral: Positive for dysphoric mood.  All other systems reviewed and are negative.       Objective:   Physical Exam  Constitutional: She is oriented to person, place, and time. She appears well-developed and well-nourished.  HENT:  Head: Normocephalic and atraumatic.  Eyes: Conjunctivae and EOM are normal. Pupils are equal, round, and reactive to light.  Neck: Normal range of motion.  Cardiovascular: Normal rate and regular rhythm.  Pulmonary/Chest: Effort normal and breath sounds normal.  Neurological: She is alert and oriented to person, place, and time. A sensory deficit is present. No cranial nerve deficit.    Strength in the legs is grossly 4+/5 in UE's. She is 4/5 LE'S.  Gait much improved, much more fluid. Minimal give is seen with standing. She did develop myoclonus with MMT of the LE's. Safety awareness improved. Stands from sitting easily.  Insight and awareness are much improved..  Skin: Skin is warm.  Psychiatric: affect more appropriate. Less impulsive.    Assessment & Plan:   1. 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 Myoclonus.  3. Seizure disorder- no further since discharge  4. Renal failure-  she follows up with nephrology on April 17  5. Hypertension. Remains borderline. No changes today  6. Anemia.  7. Thrombocytopenia - improved.  8. Mood -much improved    Plan:  1. Continue with aquatic based exercises. The improvement in her strength and balance is noticeable. i think it would be realistic for her to advance to a cane all of the time over the next several months.  2. Maintain klonopin to 0.5mg  at bid to help with myoclonus. Continue to organize medications as she is currently doing. She has a good feel for how they work and how her HD affects them.  3. Continue with keppra for seizure prophylaxis  4. Renal follow up per [facility] with HD ongoing.  5. Follow up with me in about 7 months. 30 minutes of face to face patient care time were spent during this visit. All questions were encouraged and answered.

## 2013-03-22 ENCOUNTER — Other Ambulatory Visit: Payer: Self-pay | Admitting: Physical Medicine & Rehabilitation

## 2013-05-11 NOTE — Telephone Encounter (Signed)
Pt came in for his visit closing encounter °

## 2013-08-04 ENCOUNTER — Other Ambulatory Visit: Payer: Self-pay | Admitting: Obstetrics & Gynecology

## 2013-08-17 ENCOUNTER — Telehealth: Payer: Self-pay

## 2013-08-17 NOTE — Telephone Encounter (Signed)
Patient is requesting a refill on Clonazepam. Last fill date 03/22/13 with no add'l refills. Patient's next appt is 6/10. Please advise.

## 2013-08-18 MED ORDER — CLONAZEPAM 0.5 MG PO TABS: ORAL_TABLET | ORAL | Status: DC

## 2013-08-18 NOTE — Telephone Encounter (Signed)
done

## 2013-08-18 NOTE — Telephone Encounter (Signed)
Clonazepam called into [facility].   Patient aware.

## 2013-08-30 ENCOUNTER — Encounter: Payer: Self-pay | Admitting: Physical Medicine & Rehabilitation

## 2013-08-30 ENCOUNTER — Encounter: Payer: Medicaid Other | Attending: Physical Medicine & Rehabilitation | Admitting: Physical Medicine & Rehabilitation

## 2013-08-30 VITALS — BP 128/76 | HR 103 | Resp 14 | Ht 62.0 in | Wt 126.0 lb

## 2013-08-30 DIAGNOSIS — D649 Anemia, unspecified: Secondary | ICD-10-CM | POA: Insufficient documentation

## 2013-08-30 DIAGNOSIS — N186 End stage renal disease: Secondary | ICD-10-CM

## 2013-08-30 DIAGNOSIS — D696 Thrombocytopenia, unspecified: Secondary | ICD-10-CM | POA: Insufficient documentation

## 2013-08-30 DIAGNOSIS — R569 Unspecified convulsions: Secondary | ICD-10-CM

## 2013-08-30 DIAGNOSIS — R269 Unspecified abnormalities of gait and mobility: Secondary | ICD-10-CM

## 2013-08-30 DIAGNOSIS — G931 Anoxic brain damage, not elsewhere classified: Secondary | ICD-10-CM | POA: Insufficient documentation

## 2013-08-30 DIAGNOSIS — N189 Chronic kidney disease, unspecified: Secondary | ICD-10-CM | POA: Insufficient documentation

## 2013-08-30 DIAGNOSIS — G253 Myoclonus: Secondary | ICD-10-CM

## 2013-08-30 DIAGNOSIS — I129 Hypertensive chronic kidney disease with stage 1 through stage 4 chronic kidney disease, or unspecified chronic kidney disease: Secondary | ICD-10-CM | POA: Insufficient documentation

## 2013-08-30 NOTE — Patient Instructions (Signed)
CONTINUE TO INCREASE YOUR STAMINA AND STRENGTH WITH Elie Goody

## 2013-08-30 NOTE — Progress Notes (Signed)
Subjective:    Patient ID: Heather Sims, female    DOB: [birth year], 23 y.o.   MRN: [id number]  HPI  Heather Sims is back regarding her anoxic BI. She just got a quad cane the other day as she lost her straight cane when she moved.   She and her mother just moved nearby a pool. She plans to get in the pool regularly for exercise.    She is rarely using her wheelchair. She may use the wheelchair if she goes to [employer] for school.   Medically things have been pretty stable. She remains on klonopin and keppra for her seizures and myoclonus. She has been seizure free for over 2 years.   Pain Inventory Average Pain 0 Pain Right Now 0 My pain is no pain  In the last 24 hours, has pain interfered with the following? General activity 0 Relation with others 0 Enjoyment of life 0 What TIME of day is your pain at its worst? na Sleep (in general) Fair  Pain is worse with: na Pain improves with: na Relief from Meds: no pain meds  Mobility walk with assistance use a walker do you drive?  no  Function disabled: date disabled na I need assistance with the following:  shopping  Neuro/Psych tremor  Prior Studies Any changes since last visit?  no  Physicians involved in your care Any changes since last visit?  no   Family History  Problem Relation Age of Onset  . Hypertension Mother   . Diabetes Father   . Hyperlipidemia Father    History   Social History  . Marital Status: Single    Spouse Name: N/A    Number of Children: 0  . Years of Education: college jr   Social History Main Topics  . Smoking status: Never Smoker   . Smokeless tobacco: Never Used  . Alcohol Use: No  . Drug Use: No  . Sexual Activity: No   Other Topics Concern  . None   Social History Narrative   ** Merged History Encounter **       Past Surgical History  Procedure Laterality Date  . Renal biopsy    . Av fistula placement  12/18/2011    Procedure: ARTERIOVENOUS (AV) FISTULA CREATION;   Surgeon: Angelia Mould, MD;  Location: [facility];  Service: Vascular;  Laterality: Left;   Past Medical History  Diagnosis Date  . Pneumonia     'walking pneumonia'  . Seizures   . Anemia   . Heart attack   . Cardiomyopathy   . Heart failure   . CHF (congestive heart failure)   . Ileitis   . Chronic kidney disease   . Renal disorder   . MPGN (membranoproliferative glomerulonephritis), type 2   . Hypertension   . Brain injury   . Gait disorder 01/11/2013   BP 128/76  Pulse 103  Resp 14  Ht 5\' 2"  (1.575 m)  Wt 126 lb (57.153 kg)  BMI 23.04 kg/m2  SpO2 97%  Opioid Risk Score:   Fall Risk Score: Moderate Fall Risk (6-13 points) (pt educated on fall risk, brochure given to pt)    Review of Systems  Neurological: Positive for tremors.  All other systems reviewed and are negative.      Objective:   Physical Exam  Constitutional: She is oriented to person, place, and time. She appears well-developed and well-nourished.  HENT:  Head: Normocephalic and atraumatic.  Eyes: Conjunctivae and EOM are normal. Pupils  are equal, round, and reactive to light.  Neck: Normal range of motion.  Cardiovascular: Normal rate and regular rhythm.  Pulmonary/Chest: Effort normal and breath sounds normal.  Neurological: She is alert and oriented to person, place, and time. A sensory deficit is present. No cranial nerve deficit.    Strength in the legs is grossly 55 in UE's. She is 4/5 LE'S. Gait MUCH improved!. Uses a bit of a steppage gait pattern.  Only minimal myoclonus was seen today. Safety awareness improved. Stands from sitting easily.  Insight and awareness are much improved..  Skin: Skin is warm.  Psychiatric: affect more appropriate.still flat. Takes a while to engage, but once she opened up, did well.   Assessment & Plan:   1. 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 Myoclonus.  3. Seizure disorder  4. Chronic renal failure 5. Hypertension. Remains  borderline. No changes today  6. Anemia.  7. Thrombocytopenia - improved.  8. Mood -much improved    Plan:  1. Continue with HEP/pool based therapies. Think she can advance nicely to a quad cane and stratight cane.   2. Maintain klonopin to 0.5mg  at bid for myoclonus except for HD days which it is QD.  3. Continue with keppra for seizure prophylaxis  4. Renal follow up per [facility] with HD ongoing.  5. Follow up with me in about 9 months. 30 minutes of face to face patient care time were spent during this visit. All questions were encouraged and answered.   We discussed driving today. I would ask that she take the road portion of the driving evaluation again before she resumes driving. If appropriate, she should only drive locally and during the day time within [place]. Mother and patient were in agreement.

## 2014-01-15 ENCOUNTER — Ambulatory Visit: Payer: Self-pay | Admitting: Neurology

## 2014-01-22 ENCOUNTER — Telehealth: Payer: Self-pay | Admitting: Neurology

## 2014-01-22 MED ORDER — LEVETIRACETAM 250 MG PO TABS: 250.0000 mg | ORAL_TABLET | Freq: Two times a day (BID) | ORAL | Status: DC

## 2014-01-22 NOTE — Telephone Encounter (Signed)
Unfortunately, this medication is not sampled.  I called back.  Offered to send Rx to pharmacy.  Mom was agreeable to this.  Says they now use [facility] on [address].  Rx has been sent.  She is aware.

## 2014-01-22 NOTE — Telephone Encounter (Signed)
Patient's mother requesting a few samples for levETIRAcetam (KEPPRA) 250 MG tablet until appointment with Dr. Jannifer Franklin on 11/4.  Mother states patient has enough for am dose only.  Please call and advise

## 2014-01-24 ENCOUNTER — Ambulatory Visit (INDEPENDENT_AMBULATORY_CARE_PROVIDER_SITE_OTHER): Payer: Medicaid Other | Admitting: Neurology

## 2014-01-24 ENCOUNTER — Encounter: Payer: Self-pay | Admitting: Neurology

## 2014-01-24 VITALS — BP 130/84 | HR 104 | Ht 62.0 in | Wt 125.4 lb

## 2014-01-24 DIAGNOSIS — G40409 Other generalized epilepsy and epileptic syndromes, not intractable, without status epilepticus: Secondary | ICD-10-CM

## 2014-01-24 DIAGNOSIS — R269 Unspecified abnormalities of gait and mobility: Secondary | ICD-10-CM

## 2014-01-24 MED ORDER — LEVETIRACETAM 250 MG PO TABS: 250.0000 mg | ORAL_TABLET | Freq: Two times a day (BID) | ORAL | Status: DC

## 2014-01-24 NOTE — Progress Notes (Signed)
Reason for visit: seizures  Heather Sims is an 23 y.o. female  History of present illness:  Heather Sims is a 23 year old right-handed black female with a history of a prior cardiac arrest, and anoxia of the brain with subsequent issues with gait instability, and seizures. The patient has done quite well since last seen one year ago on very low-dose Keppra. The patient takes 250 mg twice daily. The patient is on hemodialysis. The patient currently does not drive a car, but she is starting to learn how to do this. The patient denies any other significant medical issues that have come up since last seen. The patient is followed by Dr. Naaman Plummer from rehabilitation. She comes in today for routine reevaluation.   Past Medical History  Diagnosis Date  . Pneumonia     'walking pneumonia'  . Seizures   . Anemia   . Heart attack   . Cardiomyopathy   . Heart failure   . CHF (congestive heart failure)   . Ileitis   . Chronic kidney disease   . Renal disorder   . MPGN (membranoproliferative glomerulonephritis), type 2   . Hypertension   . Brain injury   . Gait disorder 01/11/2013    Past Surgical History  Procedure Laterality Date  . Renal biopsy    . Av fistula placement  12/18/2011    Procedure: ARTERIOVENOUS (AV) FISTULA CREATION;  Surgeon: Angelia Mould, MD;  Location: [facility] OR;  Service: Vascular;  Laterality: Left;    Family History  Problem Relation Age of Onset  . Hypertension Mother   . Diabetes Father   . Hyperlipidemia Father     Social history:  reports that she has never smoked. She has never used smokeless tobacco. She reports that she does not drink alcohol or use illicit drugs.    Allergies  Allergen Reactions  . Pollen Extract Swelling    Medications:  Current Outpatient Prescriptions on File Prior to Visit  Medication Sig Dispense Refill  . acetaminophen (TYLENOL) 325 MG tablet Take 2 tablets (650 mg total) by mouth every 4 (four) hours as needed  (pain).    . clonazePAM (KLONOPIN) 0.5 MG tablet TAKE ONE TABLET BY MOUTH TWICE DAILY -MUST  LAST  30  DAYS 60 tablet 3  . multivitamin (RENA-VIT) TABS tablet Take 1 tablet by mouth at bedtime. 30 tablet 1  . sevelamer carbonate (RENVELA) 800 MG tablet Take 800 mg by mouth 3 (three) times daily with meals.     No current facility-administered medications on file prior to visit.    ROS:  Out of a complete 14 system review of symptoms, the patient complains only of the following symptoms, and all other reviewed systems are negative.  Sleep talking, acting out dreams Walking difficulty Tremors Depression, anxiety  Blood pressure 130/84, pulse 104, height 5\' 2"  (1.575 m), weight 125 lb 6.4 oz (56.881 kg).  Physical Exam  General: The patient is alert and cooperative at the time of the examination.  Skin: No significant peripheral edema is noted.   Neurologic Exam  Mental status: The patient is oriented x 3.  Cranial nerves: Facial symmetry is present. Speech is normal, no aphasia or dysarthria is noted. Extraocular movements are full. Visual fields are full.  Motor: The patient has good strength in all 4 extremities.  Sensory examination: Soft touch sensation is symmetric on the face, arms, and legs.  Coordination: The patient has good finger-nose-finger bilaterally. Heel-to-shin is ataxic bilaterally.  Gait and  station: The patient has a wide-based, ataxic gait. The patient walks with a walker. Tandem gait was not attempted. Romberg is negative, but is unsteady. No drift is seen.  Reflexes: Deep tendon reflexes are symmetric.   Assessment/Plan:  1. History seizures  2. Gait instability  3. History of cardiac arrest, anoxia  4. End-stage renal disease on hemodialysis  The patient will continue the [place] at this point, she is doing well on a very low-dose taking 250 mg twice daily. The patient will follow-up in one year, a prescription was given for the [place].  Jill Alexanders MD 01/24/2014 7:24 PM  [facility] [address] [place], [place] [address]  Phone [phone] Fax [phone]

## 2014-01-24 NOTE — Patient Instructions (Signed)

## 2014-01-25 ENCOUNTER — Telehealth: Payer: Self-pay | Admitting: *Deleted

## 2014-01-25 MED ORDER — CLONAZEPAM 0.5 MG PO TABS: ORAL_TABLET | ORAL | Status: DC

## 2014-01-25 NOTE — Telephone Encounter (Signed)
Requesting refill on her klonopin. Called to pharmacy and [place] notified

## 2014-06-01 ENCOUNTER — Encounter: Payer: Self-pay | Admitting: Physical Medicine & Rehabilitation

## 2014-06-27 ENCOUNTER — Other Ambulatory Visit: Payer: Self-pay | Admitting: Physical Medicine & Rehabilitation

## 2014-06-28 ENCOUNTER — Telehealth: Payer: Self-pay | Admitting: *Deleted

## 2014-06-28 NOTE — Telephone Encounter (Signed)
Heather Sims called multiple times about getting her clonopin refilled.  It has been called to th pharmacy (rx was on print but called in )  I have left her a message reminding her that she has an appt 07/02/14 with Dr Naaman Plummer and she needs to be sure to keep the appt.

## 2014-07-02 ENCOUNTER — Encounter: Payer: Self-pay | Admitting: Physical Medicine & Rehabilitation

## 2014-07-02 ENCOUNTER — Encounter: Payer: Medicaid Other | Attending: Physical Medicine & Rehabilitation | Admitting: Physical Medicine & Rehabilitation

## 2014-07-02 VITALS — BP 135/82 | HR 80 | Resp 14

## 2014-07-02 DIAGNOSIS — G253 Myoclonus: Secondary | ICD-10-CM | POA: Diagnosis not present

## 2014-07-02 DIAGNOSIS — G40409 Other generalized epilepsy and epileptic syndromes, not intractable, without status epilepticus: Secondary | ICD-10-CM | POA: Diagnosis not present

## 2014-07-02 DIAGNOSIS — N186 End stage renal disease: Secondary | ICD-10-CM

## 2014-07-02 NOTE — Progress Notes (Signed)
Subjective:    Patient ID: Heather Sims, female    DOB: [birth year], 24 y.o.   MRN: [id number]  HPI   Ms Zhan is back regarding her myoclonus after anoxic bi. She is "shaky" (with myoclonus) while in HD and early in the morning. Things have been fairly stable otherwise. She remains on HD  She has practiced with a pronged cane on her own and felt that she had some potential. She felt awkward and fell with the cane when she fatigued.    Pain Inventory Average Pain 0 Pain Right Now 0 My pain is no pain  In the last 24 hours, has pain interfered with the following? General activity 0 Relation with others 0 Enjoyment of life 0 What TIME of day is your pain at its worst? no pain Sleep (in general) Fair  Pain is worse with: no pain Pain improves with: no pain Relief from Meds: no pain  Mobility use a walker  Function disabled: date disabled 2013 I need assistance with the following:  household duties  Neuro/Psych tremor trouble walking  Prior Studies Any changes since last visit?  no  Physicians involved in your care Any changes since last visit?  no   Family History  Problem Relation Age of Onset  . Hypertension Mother   . Diabetes Father   . Hyperlipidemia Father    History   Social History  . Marital Status: Single    Spouse Name: N/A  . Number of Children: 0  . Years of Education: college jr   Social History Main Topics  . Smoking status: Never Smoker   . Smokeless tobacco: Never Used  . Alcohol Use: No  . Drug Use: No  . Sexual Activity: No   Other Topics Concern  . None   Social History Narrative   ** Merged History Encounter **       Past Surgical History  Procedure Laterality Date  . Renal biopsy    . Av fistula placement  12/18/2011    Procedure: ARTERIOVENOUS (AV) FISTULA CREATION;  Surgeon: Angelia Mould, MD;  Location: [facility];  Service: Vascular;  Laterality: Left;   Past Medical History  Diagnosis Date  . Pneumonia       'walking pneumonia'  . Seizures   . Anemia   . Heart attack   . Cardiomyopathy   . Heart failure   . CHF (congestive heart failure)   . Ileitis   . Chronic kidney disease   . Renal disorder   . MPGN (membranoproliferative glomerulonephritis), type 2   . Hypertension   . Brain injury   . Gait disorder 01/11/2013   BP 135/82 mmHg  Pulse 80  Resp 14  SpO2 100%  Opioid Risk Score:   Fall Risk Score: High Fall Risk (>13 points) (educated and given handout today)`1  Depression screen PHQ 2/9  Depression screen PHQ 2/9 07/02/2014  Decreased Interest 1  Down, Depressed, Hopeless 1  PHQ - 2 Score 2  Altered sleeping 1  Tired, decreased energy 1  Change in appetite 0  Feeling bad or failure about yourself  2  Trouble concentrating 0  Moving slowly or fidgety/restless 0  Suicidal thoughts 0  PHQ-9 Score 6    Review of Systems  Neurological: Positive for tremors.  All other systems reviewed and are negative.      Objective:   Physical Exam   Constitutional: She is oriented to person, place, and time. She appears well-developed and well-nourished.  HENT:  Head: Normocephalic and atraumatic.  Eyes: Conjunctivae and EOM are normal. Pupils are equal, round, and reactive to light.  Neck: Normal range of motion.  Cardiovascular: Normal rate and regular rhythm.  Pulmonary/Chest: Effort normal and breath sounds normal.  Neurological: She is alert and oriented to person, place, and time. A sensory deficit is present. No cranial nerve deficit.    Strength in the legs is grossly 55 in UE's. She is 5/5 LE'S now. Gait pattern is nearly normal. No steppage or recurvatum seen. Insight and awareness are much improved..  Skin: Skin is warm.  Psychiatric: affect more appropriate. Generally more dynamic  Assessment & Plan:   1. 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 Myoclonus.  3. Seizure disorder  4. Chronic renal failure  5. Hypertension. Remains borderline.  No changes today  6. Anemia.  7. Thrombocytopenia - improved.  8. Mood -much improved   Plan:  1. Will make a referral to neuro-rehab to advance to a quad cane. I think she is ready.   2. Maintain klonopin to 0.5mg  at bid for myoclonus. Can take after HD and before bed on HD days 3. Continue with keppra for seizure prophylaxis  4. Renal follow up per [facility] with HD ongoing.  5. Follow up with me in about 3 months. 30 minutes of face to face patient care time were spent during this visit. All questions were encouraged and answered.

## 2014-07-02 NOTE — Patient Instructions (Signed)
ON HD DAYS---TAKE YOUR KLONOPIN WHEN YOU'RE DONE WITH HD AND THEN AGAIN BEFORE YOU GO TO BED

## 2014-07-11 ENCOUNTER — Ambulatory Visit: Payer: Self-pay | Admitting: Physical Medicine & Rehabilitation

## 2014-07-17 ENCOUNTER — Telehealth: Payer: Self-pay

## 2014-07-17 NOTE — Telephone Encounter (Signed)
rec'd phone call from Ramiro Harvest, [place].  Reported pt. 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  Requested appt. For evaluation.  Appt. given for 07/19/14 @ 8:30 AM.  Pt. agrees with plan.

## 2014-07-18 ENCOUNTER — Encounter: Payer: Self-pay | Admitting: Vascular Surgery

## 2014-07-19 ENCOUNTER — Ambulatory Visit (INDEPENDENT_AMBULATORY_CARE_PROVIDER_SITE_OTHER): Payer: Medicaid Other | Admitting: Vascular Surgery

## 2014-07-19 ENCOUNTER — Other Ambulatory Visit: Payer: Self-pay

## 2014-07-19 ENCOUNTER — Encounter: Payer: Self-pay | Admitting: Vascular Surgery

## 2014-07-19 VITALS — BP 136/96 | HR 94 | Ht 62.0 in | Wt 122.6 lb

## 2014-07-19 DIAGNOSIS — N186 End stage renal disease: Secondary | ICD-10-CM

## 2014-07-19 DIAGNOSIS — Z992 Dependence on renal dialysis: Secondary | ICD-10-CM

## 2014-07-19 NOTE — Progress Notes (Signed)
[facility] [place] HISTORY AND PHYSICAL   History of Present Illness:  Patient is a 23 y.o. 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 The patient had a basilic vein transposition fistula placed by Dr. Scot Dock in 2013. She denies any bleeding episodes from the fistula. She does not take any blood thinners. She denies any numbness or tingling in her left hand.  Other medical problems include anemia, cardiomyopathy, hypertension all of which are currently stable.  Past Medical History  Diagnosis Date  . Pneumonia     'walking pneumonia'  . Seizures   . Anemia   . Heart attack   . Cardiomyopathy   . Heart failure   . CHF (congestive heart failure)   . Ileitis   . Chronic kidney disease   . Renal disorder   . MPGN (membranoproliferative glomerulonephritis), type 2   . Hypertension   . Brain injury   . Gait disorder 01/11/2013    Past Surgical History  Procedure Laterality Date  . Renal biopsy    . Av fistula placement  12/18/2011    Procedure: ARTERIOVENOUS (AV) FISTULA CREATION;  Surgeon: Angelia Mould, MD;  Location: [facility] OR;  Service: Vascular;  Laterality: Left;    Social History History  Substance Use Topics  . Smoking status: Never Smoker   . Smokeless tobacco: Never Used  . Alcohol Use: No    Family History Family History  Problem Relation Age of Onset  . Hypertension Mother   . Diabetes Father   . Hyperlipidemia Father     Allergies  Allergies  Allergen Reactions  . Pollen Extract Swelling     Current Outpatient Prescriptions  Medication Sig Dispense Refill  . acetaminophen (TYLENOL) 325 MG tablet Take 2 tablets (650 mg total) by mouth every 4 (four) hours as needed (pain).    . Cinacalcet HCl (SENSIPAR PO) Take 10 mg by mouth daily.    . clonazePAM (KLONOPIN) 0.5 MG tablet TAKE ONE TABLET BY MOUTH TWICE DAILY 60 tablet 0  . levETIRAcetam (KEPPRA) 250 MG tablet  Take 1 tablet (250 mg total) by mouth 2 (two) times daily. 60 tablet 11  . multivitamin (RENA-VIT) TABS tablet Take 1 tablet by mouth at bedtime. 30 tablet 1  . sevelamer carbonate (RENVELA) 800 MG tablet Take 800 mg by mouth 3 (three) times daily with meals.     No current facility-administered medications for this visit.    ROS:   General:  No weight loss, Fever, chills  HEENT: No recent headaches, no nasal bleeding, no visual changes, no sore throat  Neurologic: No dizziness, blackouts, seizures. No recent symptoms of stroke or mini- stroke. No recent episodes of slurred speech, or temporary blindness.  Cardiac: No recent episodes of chest pain/pressure, no shortness of breath at rest.  No shortness of breath with exertion.  Denies history of atrial fibrillation or irregular heartbeat  Vascular: No history of rest pain in feet.  No history of claudication.  No history of non-healing ulcer, No history of DVT   Pulmonary: No home oxygen, no productive cough, no hemoptysis,  No asthma or wheezing  Musculoskeletal:  [ ]  Arthritis, [ ]  Low back pain,  [ ]  Joint pain  Hematologic:No history of hypercoagulable state.  No history of easy bleeding.  No history of anemia  Gastrointestinal: No hematochezia or melena,  No gastroesophageal reflux, no trouble swallowing  Urinary: [ ]  chronic Kidney disease, [ ]   on HD - [ ]  MWF or [ ]  TTHS, [ ]  Burning with urination, [ ]  Frequent urination, [ ]  Difficulty urinating;   Skin: No rashes  Psychological: No history of anxiety,  No history of depression   Physical Examination  Filed Vitals:   07/19/14 0853  BP: 136/96  Pulse: 94  Height: 5\' 2"  (1.575 m)  Weight: 122 lb 9.6 oz (55.611 kg)  SpO2: 100%    Body mass index is 22.42 kg/(m^2).  General:  Alert and oriented, no acute distress HEENT: Normal Pulmonary: Clear to auscultation bilaterally Cardiac: Regular Rate and Rhythm Skin: No rash, 2 ulcerated segments mid fistula left arm  scan is very thinned out in this area. There is a palpable thrill in the fistula. There are 2 aneurysms each 3-1/2-4 cm diameter. 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 Risks benefits possible complications and procedure details were discussed with the patient today including but not limited to bleeding infection fistula thrombosis. I also discussed with her that I do not think they will have an opportunistic her fistula because of the significant portion of the fistula involved with aneurysmal degeneration. We will place a Diatek catheter at the time of her plication. This will be able to be removed after the plication has completely healed.  Ruta Hinds, MD [facility] [place] Office: [phone] Pager: [phone]

## 2014-07-26 ENCOUNTER — Other Ambulatory Visit: Payer: Self-pay | Admitting: Physical Medicine & Rehabilitation

## 2014-07-26 NOTE — Telephone Encounter (Signed)
Recd electronic refill request for Clonopin 0.5 mg tablets #60 #RF 2 take 1 tablet BID.  Phoned in

## 2014-08-03 ENCOUNTER — Encounter (HOSPITAL_COMMUNITY): Payer: Self-pay | Admitting: *Deleted

## 2014-08-05 MED ORDER — DEXTROSE 5 % IV SOLN
1.5000 g | INTRAVENOUS | Status: AC
  Administered 2014-08-06: 1.5 g via INTRAVENOUS
  Filled 2014-08-05 (×2): qty 1.5

## 2014-08-06 ENCOUNTER — Encounter (HOSPITAL_COMMUNITY)
Admission: RE | Disposition: A | Discharge status: Home / Self Care | Payer: Self-pay | Source: Ambulatory Visit | Attending: Vascular Surgery

## 2014-08-06 ENCOUNTER — Ambulatory Visit (HOSPITAL_COMMUNITY): Payer: Medicaid Other

## 2014-08-06 ENCOUNTER — Ambulatory Visit (HOSPITAL_COMMUNITY): Payer: Medicaid Other | Admitting: Anesthesiology

## 2014-08-06 ENCOUNTER — Ambulatory Visit (HOSPITAL_COMMUNITY)
Admission: RE | Admit: 2014-08-06 | Discharge: 2014-08-06 | Disposition: A | Discharge status: Home / Self Care | Payer: Medicaid Other | Source: Ambulatory Visit | Attending: Vascular Surgery | Admitting: Vascular Surgery

## 2014-08-06 ENCOUNTER — Encounter (HOSPITAL_COMMUNITY): Payer: Self-pay | Admitting: *Deleted

## 2014-08-06 DIAGNOSIS — Z992 Dependence on renal dialysis: Secondary | ICD-10-CM

## 2014-08-06 DIAGNOSIS — T82898A Other specified complication of vascular prosthetic devices, implants and grafts, initial encounter: Secondary | ICD-10-CM | POA: Insufficient documentation

## 2014-08-06 DIAGNOSIS — I429 Cardiomyopathy, unspecified: Secondary | ICD-10-CM | POA: Insufficient documentation

## 2014-08-06 DIAGNOSIS — I120 Hypertensive chronic kidney disease with stage 5 chronic kidney disease or end stage renal disease: Secondary | ICD-10-CM | POA: Insufficient documentation

## 2014-08-06 DIAGNOSIS — Z419 Encounter for procedure for purposes other than remedying health state, unspecified: Secondary | ICD-10-CM

## 2014-08-06 DIAGNOSIS — I509 Heart failure, unspecified: Secondary | ICD-10-CM | POA: Insufficient documentation

## 2014-08-06 DIAGNOSIS — I252 Old myocardial infarction: Secondary | ICD-10-CM | POA: Insufficient documentation

## 2014-08-06 DIAGNOSIS — N186 End stage renal disease: Secondary | ICD-10-CM | POA: Diagnosis not present

## 2014-08-06 HISTORY — DX: Family history of other specified conditions: Z84.89

## 2014-08-06 HISTORY — PX: REVISON OF ARTERIOVENOUS FISTULA: SHX6074

## 2014-08-06 HISTORY — DX: Cardiac arrest, cause unspecified: I46.9

## 2014-08-06 HISTORY — DX: Major depressive disorder, single episode, unspecified: F32.9

## 2014-08-06 HISTORY — PX: INSERTION OF DIALYSIS CATHETER: SHX1324

## 2014-08-06 HISTORY — DX: Depression, unspecified: F32.A

## 2014-08-06 HISTORY — DX: Personal history of other medical treatment: Z92.89

## 2014-08-06 LAB — POCT I-STAT 4, (NA,K, GLUC, HGB,HCT)
Glucose, Bld: 83 mg/dL (ref 65–99)
HEMATOCRIT: 31 % — AB (ref 36.0–46.0)
Hemoglobin: 10.5 g/dL — ABNORMAL LOW (ref 12.0–15.0)
POTASSIUM: 4.7 mmol/L (ref 3.5–5.1)
SODIUM: 138 mmol/L (ref 135–145)

## 2014-08-06 LAB — HCG, SERUM, QUALITATIVE: PREG SERUM: NEGATIVE

## 2014-08-06 SURGERY — REVISON OF ARTERIOVENOUS FISTULA
Anesthesia: Monitor Anesthesia Care | Site: Chest | Laterality: Right

## 2014-08-06 MED ORDER — ARTIFICIAL TEARS OP OINT
TOPICAL_OINTMENT | OPHTHALMIC | Status: AC
  Filled 2014-08-06: qty 3.5

## 2014-08-06 MED ORDER — LIDOCAINE HCL (PF) 1 % IJ SOLN
INTRAMUSCULAR | Status: DC | PRN
  Administered 2014-08-06: 30 mL

## 2014-08-06 MED ORDER — ONDANSETRON HCL 4 MG/2ML IJ SOLN
INTRAMUSCULAR | Status: DC | PRN
  Administered 2014-08-06: 4 mg via INTRAVENOUS

## 2014-08-06 MED ORDER — SODIUM CHLORIDE 0.9 % IV SOLN
INTRAVENOUS | Status: DC
  Administered 2014-08-06: 07:00:00 via INTRAVENOUS

## 2014-08-06 MED ORDER — HEPARIN SODIUM (PORCINE) 1000 UNIT/ML IJ SOLN
INTRAMUSCULAR | Status: AC
  Filled 2014-08-06: qty 1

## 2014-08-06 MED ORDER — LIDOCAINE HCL (CARDIAC) 20 MG/ML IV SOLN
INTRAVENOUS | Status: AC
  Filled 2014-08-06: qty 10

## 2014-08-06 MED ORDER — PROPOFOL 10 MG/ML IV BOLUS
INTRAVENOUS | Status: AC
  Filled 2014-08-06: qty 20

## 2014-08-06 MED ORDER — THROMBIN 20000 UNITS EX SOLR
CUTANEOUS | Status: AC
  Filled 2014-08-06: qty 20000

## 2014-08-06 MED ORDER — PHENYLEPHRINE 40 MCG/ML (10ML) SYRINGE FOR IV PUSH (FOR BLOOD PRESSURE SUPPORT)
PREFILLED_SYRINGE | INTRAVENOUS | Status: AC
  Filled 2014-08-06: qty 20

## 2014-08-06 MED ORDER — THROMBIN 20000 UNITS EX SOLR
CUTANEOUS | Status: DC | PRN
  Administered 2014-08-06: 20 mL via TOPICAL

## 2014-08-06 MED ORDER — SODIUM CHLORIDE 0.9 % IJ SOLN
INTRAMUSCULAR | Status: AC
  Filled 2014-08-06: qty 10

## 2014-08-06 MED ORDER — ONDANSETRON HCL 4 MG/2ML IJ SOLN: 4.0000 mg | Freq: Four times a day (QID) | INTRAMUSCULAR | Status: DC | PRN

## 2014-08-06 MED ORDER — MIDAZOLAM HCL 2 MG/2ML IJ SOLN
INTRAMUSCULAR | Status: AC
  Filled 2014-08-06: qty 2

## 2014-08-06 MED ORDER — CHLORHEXIDINE GLUCONATE CLOTH 2 % EX PADS: 6.0000 | MEDICATED_PAD | Freq: Once | CUTANEOUS | Status: DC

## 2014-08-06 MED ORDER — 0.9 % SODIUM CHLORIDE (POUR BTL) OPTIME
TOPICAL | Status: DC | PRN
  Administered 2014-08-06: 1000 mL

## 2014-08-06 MED ORDER — EPHEDRINE SULFATE 50 MG/ML IJ SOLN
INTRAMUSCULAR | Status: AC
  Filled 2014-08-06: qty 1

## 2014-08-06 MED ORDER — LIDOCAINE HCL (PF) 1 % IJ SOLN
INTRAMUSCULAR | Status: AC
  Filled 2014-08-06: qty 30

## 2014-08-06 MED ORDER — OXYCODONE HCL 5 MG PO TABS: 5.0000 mg | ORAL_TABLET | Freq: Once | ORAL | Status: DC | PRN

## 2014-08-06 MED ORDER — PROTAMINE SULFATE 10 MG/ML IV SOLN
INTRAVENOUS | Status: DC | PRN
  Administered 2014-08-06: 50 mg via INTRAVENOUS

## 2014-08-06 MED ORDER — FENTANYL CITRATE (PF) 250 MCG/5ML IJ SOLN
INTRAMUSCULAR | Status: AC
  Filled 2014-08-06: qty 5

## 2014-08-06 MED ORDER — HEPARIN SODIUM (PORCINE) 1000 UNIT/ML IJ SOLN
INTRAMUSCULAR | Status: AC
  Filled 2014-08-06: qty 3

## 2014-08-06 MED ORDER — ONDANSETRON HCL 4 MG/2ML IJ SOLN
INTRAMUSCULAR | Status: AC
  Filled 2014-08-06: qty 2

## 2014-08-06 MED ORDER — PROPOFOL INFUSION 10 MG/ML OPTIME
INTRAVENOUS | Status: DC | PRN
  Administered 2014-08-06: 25 ug/kg/min via INTRAVENOUS

## 2014-08-06 MED ORDER — LIDOCAINE HCL (CARDIAC) 20 MG/ML IV SOLN
INTRAVENOUS | Status: DC | PRN
  Administered 2014-08-06: 50 mg via INTRAVENOUS

## 2014-08-06 MED ORDER — OXYCODONE HCL 5 MG PO TABS: 5.0000 mg | ORAL_TABLET | Freq: Four times a day (QID) | ORAL | Status: DC | PRN

## 2014-08-06 MED ORDER — ROCURONIUM BROMIDE 50 MG/5ML IV SOLN
INTRAVENOUS | Status: AC
  Filled 2014-08-06: qty 1

## 2014-08-06 MED ORDER — HEPARIN SODIUM (PORCINE) 1000 UNIT/ML IJ SOLN
INTRAMUSCULAR | Status: DC | PRN
  Administered 2014-08-06: 4.6 mL via INTRAVENOUS

## 2014-08-06 MED ORDER — OXYCODONE HCL 5 MG/5ML PO SOLN: 5.0000 mg | Freq: Once | ORAL | Status: DC | PRN

## 2014-08-06 MED ORDER — SUCCINYLCHOLINE CHLORIDE 20 MG/ML IJ SOLN
INTRAMUSCULAR | Status: AC
  Filled 2014-08-06: qty 1

## 2014-08-06 MED ORDER — MIDAZOLAM HCL 5 MG/5ML IJ SOLN
INTRAMUSCULAR | Status: DC | PRN
  Administered 2014-08-06 (×2): 1 mg via INTRAVENOUS

## 2014-08-06 MED ORDER — HEPARIN SODIUM (PORCINE) 1000 UNIT/ML IJ SOLN
INTRAMUSCULAR | Status: DC | PRN
Start: 2014-08-06 — End: 2014-08-06
  Administered 2014-08-06: 5000 [IU] via INTRAVENOUS

## 2014-08-06 MED ORDER — SODIUM CHLORIDE 0.9 % IR SOLN
Status: DC | PRN
  Administered 2014-08-06: 500 mL

## 2014-08-06 MED ORDER — FENTANYL CITRATE (PF) 100 MCG/2ML IJ SOLN: 25.0000 ug | INTRAMUSCULAR | Status: DC | PRN

## 2014-08-06 MED ORDER — FENTANYL CITRATE (PF) 100 MCG/2ML IJ SOLN
INTRAMUSCULAR | Status: DC | PRN
  Administered 2014-08-06 (×5): 50 ug via INTRAVENOUS

## 2014-08-06 SURGICAL SUPPLY — 61 items
BAG DECANTER FOR FLEXI CONT (MISCELLANEOUS) ×3 IMPLANT
BANDAGE ELASTIC 4 VELCRO ST LF (GAUZE/BANDAGES/DRESSINGS) ×3 IMPLANT
BIOPATCH BLUE 3/4IN DISK W/1.5 (GAUZE/BANDAGES/DRESSINGS) ×3 IMPLANT
BIOPATCH RED 1 DISK 7.0 (GAUZE/BANDAGES/DRESSINGS) ×3 IMPLANT
CANISTER SUCTION 2500CC (MISCELLANEOUS) ×3 IMPLANT
CANNULA VESSEL 3MM 2 BLNT TIP (CANNULA) ×3 IMPLANT
CATH CANNON HEMO 15F 50CM (CATHETERS) IMPLANT
CATH CANNON HEMO 15FR 19 (HEMODIALYSIS SUPPLIES) IMPLANT
CATH CANNON HEMO 15FR 23CM (HEMODIALYSIS SUPPLIES) ×6 IMPLANT
CATH CANNON HEMO 15FR 31CM (HEMODIALYSIS SUPPLIES) IMPLANT
CATH CANNON HEMO 15FR 32CM (HEMODIALYSIS SUPPLIES) IMPLANT
CATH STRAIGHT 5FR 65CM (CATHETERS) IMPLANT
CHLORAPREP W/TINT 26ML (MISCELLANEOUS) ×3 IMPLANT
CLIP TI MEDIUM 6 (CLIP) ×3 IMPLANT
CLIP TI WIDE RED SMALL 6 (CLIP) ×3 IMPLANT
COVER PROBE W GEL 5X96 (DRAPES) ×3 IMPLANT
DECANTER SPIKE VIAL GLASS SM (MISCELLANEOUS) ×3 IMPLANT
DRAIN PENROSE 1/4X12 LTX STRL (WOUND CARE) ×3 IMPLANT
DRAPE C-ARM 42X72 X-RAY (DRAPES) ×3 IMPLANT
DRAPE CHEST BREAST 15X10 FENES (DRAPES) ×3 IMPLANT
DRSG TEGADERM 2-3/8X2-3/4 SM (GAUZE/BANDAGES/DRESSINGS) ×3 IMPLANT
ELECT REM PT RETURN 9FT ADLT (ELECTROSURGICAL) ×3
ELECTRODE REM PT RTRN 9FT ADLT (ELECTROSURGICAL) ×2 IMPLANT
GAUZE SPONGE 2X2 8PLY STRL LF (GAUZE/BANDAGES/DRESSINGS) ×2 IMPLANT
GAUZE SPONGE 4X4 16PLY XRAY LF (GAUZE/BANDAGES/DRESSINGS) ×3 IMPLANT
GEL ULTRASOUND 20GR AQUASONIC (MISCELLANEOUS) IMPLANT
GLOVE BIO SURGEON STRL SZ7.5 (GLOVE) ×3 IMPLANT
GLOVE BIOGEL M 6.5 STRL (GLOVE) ×6 IMPLANT
GLOVE BIOGEL PI IND STRL 6.5 (GLOVE) ×4 IMPLANT
GLOVE BIOGEL PI INDICATOR 6.5 (GLOVE) ×2
GLOVE SURG SS PI 6.0 STRL IVOR (GLOVE) ×3 IMPLANT
GOWN STRL REUS W/ TWL LRG LVL3 (GOWN DISPOSABLE) ×10 IMPLANT
GOWN STRL REUS W/ TWL XL LVL3 (GOWN DISPOSABLE) ×4 IMPLANT
GOWN STRL REUS W/TWL LRG LVL3 (GOWN DISPOSABLE) ×5
GOWN STRL REUS W/TWL XL LVL3 (GOWN DISPOSABLE) ×2
KIT BASIN OR (CUSTOM PROCEDURE TRAY) ×3 IMPLANT
KIT ROOM TURNOVER OR (KITS) ×3 IMPLANT
LIQUID BAND (GAUZE/BANDAGES/DRESSINGS) ×6 IMPLANT
LOOP VESSEL MINI RED (MISCELLANEOUS) IMPLANT
NEEDLE 18GX1X1/2 (RX/OR ONLY) (NEEDLE) ×3 IMPLANT
NEEDLE HYPO 25GX1X1/2 BEV (NEEDLE) ×3 IMPLANT
NS IRRIG 1000ML POUR BTL (IV SOLUTION) ×3 IMPLANT
PACK CV ACCESS (CUSTOM PROCEDURE TRAY) ×3 IMPLANT
PACK SURGICAL SETUP 50X90 (CUSTOM PROCEDURE TRAY) ×3 IMPLANT
PAD ARMBOARD 7.5X6 YLW CONV (MISCELLANEOUS) ×6 IMPLANT
SET MICROPUNCTURE 5F STIFF (MISCELLANEOUS) IMPLANT
SPONGE GAUZE 2X2 STER 10/PKG (GAUZE/BANDAGES/DRESSINGS) ×1
SPONGE SURGIFOAM ABS GEL 100 (HEMOSTASIS) ×3 IMPLANT
SUT ETHILON 3 0 PS 1 (SUTURE) ×3 IMPLANT
SUT PROLENE 5 0 C 1 24 (SUTURE) ×6 IMPLANT
SUT PROLENE 7 0 BV 1 (SUTURE) ×3 IMPLANT
SUT VIC AB 3-0 SH 27 (SUTURE) ×1
SUT VIC AB 3-0 SH 27X BRD (SUTURE) ×2 IMPLANT
SUT VICRYL 4-0 PS2 18IN ABS (SUTURE) ×6 IMPLANT
SYR 20CC LL (SYRINGE) ×6 IMPLANT
SYR 5ML LL (SYRINGE) ×3 IMPLANT
SYR CONTROL 10ML LL (SYRINGE) ×3 IMPLANT
SYRINGE 10CC LL (SYRINGE) ×3 IMPLANT
UNDERPAD 30X30 INCONTINENT (UNDERPADS AND DIAPERS) ×3 IMPLANT
WATER STERILE IRR 1000ML POUR (IV SOLUTION) ×3 IMPLANT
WIRE AMPLATZ SS-J .035X180CM (WIRE) IMPLANT

## 2014-08-06 NOTE — Anesthesia Procedure Notes (Signed)
Procedure Name: MAC Date/Time: 08/06/2014 7:37 AM Performed by: Jacquiline Doe A Pre-anesthesia Checklist: Patient identified, Emergency Drugs available, Suction available, Patient being monitored and Timeout performed Patient Re-evaluated:Patient Re-evaluated prior to inductionOxygen Delivery Method: Simple face mask Preoxygenation: Pre-oxygenation with 100% oxygen Intubation Type: IV induction Dental Injury: Teeth and Oropharynx as per pre-operative assessment

## 2014-08-06 NOTE — Interval H&P Note (Signed)
History and Physical Interval Note:  08/06/2014 7:18 AM  Heather Sims  has presented today for surgery, with the diagnosis of Aneurysmal degeneration of left arm arteriovenous fistula I72.9  The various methods of treatment have been discussed with the patient and family. After consideration of risks, benefits and other options for treatment, the patient has consented to  Procedure(s): PLICATION OF ARTERIOVENOUS FISTULA (Left) INSERTION OF DIALYSIS CATHETER (N/A) as a surgical intervention .  The patient's history has been reviewed, patient examined, no change in status, stable for surgery.  I have reviewed the patient's chart and labs.  Questions were answered to the patient's satisfaction.     Ruta Hinds

## 2014-08-06 NOTE — Op Note (Signed)
Procedure: #1 ultrasound neck #2 insertion of Diatek catheter #3 plication of left upper extremity AV fistula  Preoperative diagnosis: Aneurysmal degeneration left arm AV fistula, ESRD  Postoperative diagnosis: Same  Anesthesia: Local with sedation  Assistant: Leontine Locket PA-C  Operative findings: #1 plication of proximal half of left upper extremity AV fistula           #2 23 cm Diatek catheter right internal jugular vein  Operative details: After obtaining informed consent, the patient was taken to the operating room. The patient was placed in supine position on the operating room table. After adequate sedation, the patient's entire neck and chest were prepped and draped in usual sterile fashion. The patient was placed in Trendelenburg position. Ultrasound showed the right internal jugular vein to be pateent.   This had normal compressibility and respiratory variation.Using ultrasound guidance, the right internal jugular vein was successfully cannulated.  A 0.035 J-tipped guidewire was threaded into the right internal jugular vein and into the superior vena cava followed by the inferior vena cava under fluoroscopic guidance.   Next sequential 12 and 14 dilators were placed over the guidewire into the right atrium.  A 16 French dilator with a peel-away sheath was then placed over the guidewire into the right atrium.   The guidewire and dilator were removed. A 23 cm Diatek catheter was then placed through the peel away sheath into the right atrium.  The catheter was then tunneled subcutaneously, cut to length, and the hub attached. The catheter was noted to flush and draw easily. The catheter was inspected under fluoroscopy and found with its tip to be in the right atrium without any kinks throughout its course. The catheter was sutured to the skin with nylon sutures. The neck insertion site was closed with Vicryl stitch. The catheter was then loaded with concentrated Heparin solution. A dry sterile  dressing was applied.  Next attention was turned to the left upper extremity. The entire left upper extremity was prepped and draped in usual sterile fashion. A longitudinal incision was made over the proximal third of the fistula incorporating 2 areas of aneurysmal degeneration. The fistula was dissected free circumferentially throughout its course. The more proximal aneurysm was multilobulated and very thin walled. The more distal aneurysm was smaller. The patient was given 5000 units of intravenous heparin. The fistula was clamped proximally and distally above and below the aneurysms. The aneurysm was opened longitudinally and the redundant segment fistula excised. The area of resection was then reapproximated using a running 5-0 Prolene suture. The clamps were removed and flow restored to the fistula. Hemostasis was obtained with administration of 50 mg of protamine and thrombin and gelfoam. Some of the redundant skin was then resected. The subcutaneous tissues were reapproximated using a running 3-0 Vicryl suture. The skin was closed with a 4 0 Vicryl subcuticular stitch. Dermabond was applied to the skin incision.  The patient tolerated procedure well and there were no complications. Instrument sponge and needle counts were correct at the end of the case. The patient was taken to the recovery room in stable condition. Chest x-ray will be obtained in the recovery room.  Ruta Hinds, MD [facility] [place] Office: [phone] Pager: [phone]

## 2014-08-06 NOTE — Anesthesia Preprocedure Evaluation (Signed)
Anesthesia Evaluation  Patient identified by MRN, date of birth, ID band Patient awake    Reviewed: Allergy & Precautions, NPO status , Patient's Chart, lab work & pertinent test results  Airway Mallampati: II   Neck ROM: full    Dental   Pulmonary  breath sounds clear to auscultation        Cardiovascular hypertension, +CHF Rhythm:regular Rate:Normal     Neuro/Psych Seizures -,  PSYCHIATRIC DISORDERS Depression    GI/Hepatic   Endo/Other    Renal/GU ESRF and DialysisRenal disease     Musculoskeletal   Abdominal   Peds  Hematology   Anesthesia Other Findings   Reproductive/Obstetrics                             Anesthesia Physical Anesthesia Plan  ASA: III  Anesthesia Plan: MAC   Post-op Pain Management:    Induction: Intravenous  Airway Management Planned: Simple Face Mask  Additional Equipment:   Intra-op Plan:   Post-operative Plan:   Informed Consent: I have reviewed the patients History and Physical, chart, labs and discussed the procedure including the risks, benefits and alternatives for the proposed anesthesia with the patient or authorized representative who has indicated his/her understanding and acceptance.     Plan Discussed with: CRNA, Anesthesiologist and Surgeon  Anesthesia Plan Comments:         Anesthesia Quick Evaluation

## 2014-08-06 NOTE — Anesthesia Postprocedure Evaluation (Signed)
Anesthesia Post Note  Patient: Heather Sims  Procedure(s) Performed: Procedure(s) (LRB): PLICATION OF ARTERIOVENOUS FISTULA (Left) INSERTION OF DIALYSIS CATHETER (Right)  Anesthesia type: MAC  Patient location: PACU  Post pain: Pain level controlled and Adequate analgesia  Post assessment: Post-op Vital signs reviewed, Patient's Cardiovascular Status Stable and Respiratory Function Stable  Last Vitals:  Filed Vitals:   08/06/14 1023  BP: 135/93  Pulse: 93  Temp:   Resp: 12    Post vital signs: Reviewed and stable  Level of consciousness: awake, alert  and oriented  Complications: No apparent anesthesia complications

## 2014-08-06 NOTE — Discharge Instructions (Signed)
° ° °  08/06/2014 Heather Sims [id number] 12-20-1990  Surgeon(s): Elam Dutch, MD  Procedure(s): PLICATION OF ARTERIOVENOUS FISTULA-left INSERTION OF DIALYSIS CATHETER  x Do not stick fistula for 6 weeks

## 2014-08-06 NOTE — Transfer of Care (Signed)
Immediate Anesthesia Transfer of Care Note  Patient: Heather Sims  Procedure(s) Performed: Procedure(s): PLICATION OF ARTERIOVENOUS FISTULA (Left) INSERTION OF DIALYSIS CATHETER (Right)  Patient Location: PACU  Anesthesia Type:MAC  Level of Consciousness: oriented, sedated, patient cooperative and responds to stimulation  Airway & Oxygen Therapy: Patient Spontanous Breathing and Patient connected to face mask oxygen  Post-op Assessment: Report given to RN, Post -op Vital signs reviewed and stable, Patient moving all extremities and Patient moving all extremities X 4  Post vital signs: Reviewed and stable  Last Vitals:  Filed Vitals:   08/06/14 0639  BP: 170/113  Pulse:   Temp:   Resp:     Complications: No apparent anesthesia complications

## 2014-08-06 NOTE — H&P (View-Only) (Signed)
[facility] [place] HISTORY AND PHYSICAL   History of Present Illness:  Patient is a 24 y.o. 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 The patient had a basilic vein transposition fistula placed by Dr. Scot Dock in 2013. She denies any bleeding episodes from the fistula. She does not take any blood thinners. She denies any numbness or tingling in her left hand.  Other medical problems include anemia, cardiomyopathy, hypertension all of which are currently stable.  Past Medical History  Diagnosis Date  . Pneumonia     'walking pneumonia'  . Seizures   . Anemia   . Heart attack   . Cardiomyopathy   . Heart failure   . CHF (congestive heart failure)   . Ileitis   . Chronic kidney disease   . Renal disorder   . MPGN (membranoproliferative glomerulonephritis), type 2   . Hypertension   . Brain injury   . Gait disorder 01/11/2013    Past Surgical History  Procedure Laterality Date  . Renal biopsy    . Av fistula placement  12/18/2011    Procedure: ARTERIOVENOUS (AV) FISTULA CREATION;  Surgeon: Angelia Mould, MD;  Location: [facility] OR;  Service: Vascular;  Laterality: Left;    Social History History  Substance Use Topics  . Smoking status: Never Smoker   . Smokeless tobacco: Never Used  . Alcohol Use: No    Family History Family History  Problem Relation Age of Onset  . Hypertension Mother   . Diabetes Father   . Hyperlipidemia Father     Allergies  Allergies  Allergen Reactions  . Pollen Extract Swelling     Current Outpatient Prescriptions  Medication Sig Dispense Refill  . acetaminophen (TYLENOL) 325 MG tablet Take 2 tablets (650 mg total) by mouth every 4 (four) hours as needed (pain).    . Cinacalcet HCl (SENSIPAR PO) Take 10 mg by mouth daily.    . clonazePAM (KLONOPIN) 0.5 MG tablet TAKE ONE TABLET BY MOUTH TWICE DAILY 60 tablet 0  . levETIRAcetam (KEPPRA) 250 MG tablet  Take 1 tablet (250 mg total) by mouth 2 (two) times daily. 60 tablet 11  . multivitamin (RENA-VIT) TABS tablet Take 1 tablet by mouth at bedtime. 30 tablet 1  . sevelamer carbonate (RENVELA) 800 MG tablet Take 800 mg by mouth 3 (three) times daily with meals.     No current facility-administered medications for this visit.    ROS:   General:  No weight loss, Fever, chills  HEENT: No recent headaches, no nasal bleeding, no visual changes, no sore throat  Neurologic: No dizziness, blackouts, seizures. No recent symptoms of stroke or mini- stroke. No recent episodes of slurred speech, or temporary blindness.  Cardiac: No recent episodes of chest pain/pressure, no shortness of breath at rest.  No shortness of breath with exertion.  Denies history of atrial fibrillation or irregular heartbeat  Vascular: No history of rest pain in feet.  No history of claudication.  No history of non-healing ulcer, No history of DVT   Pulmonary: No home oxygen, no productive cough, no hemoptysis,  No asthma or wheezing  Musculoskeletal:  [ ]  Arthritis, [ ]  Low back pain,  [ ]  Joint pain  Hematologic:No history of hypercoagulable state.  No history of easy bleeding.  No history of anemia  Gastrointestinal: No hematochezia or melena,  No gastroesophageal reflux, no trouble swallowing  Urinary: [ ]  chronic Kidney disease, [ ]   on HD - [ ]  MWF or [ ]  TTHS, [ ]  Burning with urination, [ ]  Frequent urination, [ ]  Difficulty urinating;   Skin: No rashes  Psychological: No history of anxiety,  No history of depression   Physical Examination  Filed Vitals:   07/19/14 0853  BP: 136/96  Pulse: 94  Height: 5\' 2"  (1.575 m)  Weight: 122 lb 9.6 oz (55.611 kg)  SpO2: 100%    Body mass index is 22.42 kg/(m^2).  General:  Alert and oriented, no acute distress HEENT: Normal Pulmonary: Clear to auscultation bilaterally Cardiac: Regular Rate and Rhythm Skin: No rash, 2 ulcerated segments mid fistula left arm  scan is very thinned out in this area. There is a palpable thrill in the fistula. There are 2 aneurysms each 3-1/2-4 cm diameter. 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 Risks benefits possible complications and procedure details were discussed with the patient today including but not limited to bleeding infection fistula thrombosis. I also discussed with her that I do not think they will have an opportunistic her fistula because of the significant portion of the fistula involved with aneurysmal degeneration. We will place a Diatek catheter at the time of her plication. This will be able to be removed after the plication has completely healed.  Ruta Hinds, MD [facility] [place] Office: [phone] Pager: [phone]

## 2014-08-08 ENCOUNTER — Telehealth: Payer: Self-pay | Admitting: Vascular Surgery

## 2014-08-08 ENCOUNTER — Encounter (HOSPITAL_COMMUNITY): Payer: Self-pay | Admitting: Vascular Surgery

## 2014-08-08 NOTE — Telephone Encounter (Signed)
-----   Message from Mena Goes, RN sent at 08/06/2014  4:24 PM EDT ----- Regarding: schedule   ----- Message -----    From: Gabriel Earing, PA-C    Sent: 08/06/2014   9:47 AM      To: [facility] Charge Pool  S/p plication of left arm fistula and placement of diatek catheter 08/06/14.  F/u with Dr. Oneida Alar in 2 weeks.  Thanks, Aldona Bar

## 2014-08-08 NOTE — Telephone Encounter (Signed)
Left message for pt re appt, dpm  ° °

## 2014-08-21 ENCOUNTER — Encounter: Payer: Self-pay | Admitting: Vascular Surgery

## 2014-08-23 ENCOUNTER — Ambulatory Visit (INDEPENDENT_AMBULATORY_CARE_PROVIDER_SITE_OTHER): Payer: Self-pay | Admitting: Vascular Surgery

## 2014-08-23 ENCOUNTER — Encounter: Payer: Self-pay | Admitting: Vascular Surgery

## 2014-08-23 VITALS — BP 168/115 | HR 98 | Ht 62.0 in | Wt 124.0 lb

## 2014-08-23 DIAGNOSIS — Z992 Dependence on renal dialysis: Secondary | ICD-10-CM

## 2014-08-23 DIAGNOSIS — N186 End stage renal disease: Secondary | ICD-10-CM

## 2014-08-23 NOTE — Progress Notes (Signed)
Patient is a 24 year old female returns for postoperative follow-up today. She had plication of a left upper arm AV fistula performed on 08/06/2014. She also had a Diatek catheter placed at that time. She denies any incisional drainage. She has no fever or chills. She states her catheter is functioning well.  Physical exam:  Filed Vitals:   08/23/14 0846  BP: 168/115  Pulse: 98  Height: 5\' 2"  (1.575 m)  Weight: 56.246 kg (124 lb)  SpO2: 100%    Left upper extremity: Healing incision left upper arm palpable thrill in fistula  Assessment: Doing well status post plication left upper arm AV fistula. The patient will follow-up with me in late June to assess whether or not the incision is healed enough to start cannulating the fistula again.  Ruta Hinds, MD [facility] [place] Office: [phone] Pager: [phone]

## 2014-09-12 DIAGNOSIS — Z0289 Encounter for other administrative examinations: Secondary | ICD-10-CM

## 2014-09-19 ENCOUNTER — Encounter: Payer: Self-pay | Admitting: Vascular Surgery

## 2014-09-20 ENCOUNTER — Encounter: Payer: Self-pay | Admitting: Vascular Surgery

## 2014-09-20 ENCOUNTER — Ambulatory Visit (INDEPENDENT_AMBULATORY_CARE_PROVIDER_SITE_OTHER): Payer: Self-pay | Admitting: Vascular Surgery

## 2014-09-20 VITALS — BP 176/111 | HR 99 | Ht 62.0 in | Wt 123.2 lb

## 2014-09-20 DIAGNOSIS — Z992 Dependence on renal dialysis: Secondary | ICD-10-CM

## 2014-09-20 DIAGNOSIS — N186 End stage renal disease: Secondary | ICD-10-CM

## 2014-09-20 NOTE — Progress Notes (Signed)
Patient is a 24 year old female returns for postoperative follow-up today. She had plication of a left upper arm AV fistula performed on 08/06/2014. She also had a Diatek catheter placed at that time. She denies any incisional drainage. She has no fever or chills. She states her catheter is functioning well.  Physical exam:  Filed Vitals:   09/20/14 0913 09/20/14 0915  BP: 189/121 176/111  Pulse: 99   Height: 5\' 2"  (1.575 m)   Weight: 123 lb 3.2 oz (55.883 kg)   SpO2: 100%    Left upper extremity: Healing incision left upper arm palpable thrill in fistula  Assessment: Doing well status post plication left upper arm AV fistula. The patient will follow-up on as-needed basis. She will begin cannulation of her fistula this week. Patient was encouraged to follow up with her nephrologist regarding poorly controlled hypertension.  Ruta Hinds, MD [facility] [place] Office: [phone] Pager: [phone]

## 2014-10-01 ENCOUNTER — Encounter: Payer: Medicaid Other | Attending: Physical Medicine & Rehabilitation | Admitting: Physical Medicine & Rehabilitation

## 2014-10-01 ENCOUNTER — Encounter: Payer: Self-pay | Admitting: Physical Medicine & Rehabilitation

## 2014-10-01 VITALS — BP 182/120 | HR 92 | Resp 14

## 2014-10-01 DIAGNOSIS — G253 Myoclonus: Secondary | ICD-10-CM | POA: Insufficient documentation

## 2014-10-01 DIAGNOSIS — G40409 Other generalized epilepsy and epileptic syndromes, not intractable, without status epilepticus: Secondary | ICD-10-CM | POA: Diagnosis not present

## 2014-10-01 DIAGNOSIS — R269 Unspecified abnormalities of gait and mobility: Secondary | ICD-10-CM | POA: Diagnosis not present

## 2014-10-01 DIAGNOSIS — N186 End stage renal disease: Secondary | ICD-10-CM | POA: Diagnosis not present

## 2014-10-01 MED ORDER — CLONAZEPAM 0.5 MG PO TABS: 0.5000 mg | ORAL_TABLET | Freq: Two times a day (BID) | ORAL | Status: DC

## 2014-10-01 NOTE — Patient Instructions (Signed)
CONTACT us IF YOU DON'T HEAR FROM REHAB ABOUT YOUR APPOINTMENT

## 2014-10-01 NOTE — Progress Notes (Signed)
Subjective:    Patient ID: Heather Sims, female    DOB: December 08, 1990, 24 y.o.   MRN: [id number]  HPI   Heather Sims is back regarding her gait disorder and myoclonus. She was unable to advance to a cane because she had problems with her fistula. She couldn't do any lifting or progress with the cane as she planned.   She had questions about bp medications as her bp has been rising lately. She states that one of her providers had mentioned a particular medication that they might try   Pain Inventory Average Pain 0 Pain Right Now 0 My pain is intermittent, dull and aching  In the last 24 hours, has pain interfered with the following? General activity 0 Relation with others 0 Enjoyment of life 0 What TIME of day is your pain at its worst? VARIES Sleep (in general) Fair  Pain is worse with: no pain today Pain improves with: no pain today Relief from Meds: 0  Mobility use a walker how many minutes can you walk? 10-15 ability to climb steps?  yes do you drive?  yes  Function disabled: date disabled .  Neuro/Psych tremor  Prior Studies Any changes since last visit?  no  Physicians involved in your care Any changes since last visit?  no   Family History  Problem Relation Age of Onset  . Hypertension Mother   . Diabetes Father   . Hyperlipidemia Father    History   Social History  . Marital Status: Single    Spouse Name: N/A  . Number of Children: 0  . Years of Education: college jr   Social History Main Topics  . Smoking status: Never Smoker   . Smokeless tobacco: Never Used  . Alcohol Use: No  . Drug Use: No  . Sexual Activity: No   Other Topics Concern  . None   Social History Narrative   ** Merged History Encounter **       Past Surgical History  Procedure Laterality Date  . Renal biopsy    . Av fistula placement  12/18/2011    Procedure: ARTERIOVENOUS (AV) FISTULA CREATION;  Surgeon: Angelia Mould, MD;  Location: [facility] OR;  Service:  Vascular;  Laterality: Left;  . Revison of arteriovenous fistula Left 07/09/6220    Procedure: PLICATION OF ARTERIOVENOUS FISTULA;  Surgeon: Elam Dutch, MD;  Location: [place];  Service: Vascular;  Laterality: Left;  . Insertion of dialysis catheter Right 08/06/2014    Procedure: INSERTION OF DIALYSIS CATHETER;  Surgeon: Elam Dutch, MD;  Location: [place];  Service: Vascular;  Laterality: Right;   Past Medical History  Diagnosis Date  . Pneumonia     'walking pneumonia'  . Seizures   . Anemia   . Cardiomyopathy   . Heart failure   . CHF (congestive heart failure)   . Ileitis   . Hypertension   . Brain injury   . Gait disorder 01/11/2013  . Family history of adverse reaction to anesthesia     Mother hard to wake up.  . Cardiac arrest 03/26/2011  . Depression   . Chronic kidney disease     TThS- [place]  . Renal disorder   . MPGN (membranoproliferative glomerulonephritis), type 2   . History of blood transfusion 11/2011   BP 182/120 mmHg  Pulse 92  Resp 14  SpO2 99%  Opioid Risk Score:   Fall Risk Score: Low Fall Risk (0-5 points)`1  Depression screen [facility] 2/9  Depression screen PHQ 2/9 07/02/2014  Decreased Interest 1  Down, Depressed, Hopeless 1  PHQ - 2 Score 2  Altered sleeping 1  Tired, decreased energy 1  Change in appetite 0  Feeling bad or failure about yourself  2  Trouble concentrating 0  Moving slowly or fidgety/restless 0  Suicidal thoughts 0  PHQ-9 Score 6    Review of Systems  Constitutional: Negative.   HENT: Negative.   Eyes: Negative.   Respiratory: Negative.   Cardiovascular: Negative.   Gastrointestinal: Negative.   Endocrine: Negative.   Genitourinary: Positive for dysuria.  Musculoskeletal: Negative.   Allergic/Immunologic: Negative.   Neurological:       Tremors  Hematological: Negative.   Psychiatric/Behavioral: Positive for dysphoric mood.       Objective:   Physical Exam   Constitutional: She is oriented to person,  place, and time. She appears well-developed and well-nourished.  HENT:  Head: Normocephalic and atraumatic.  Eyes: Conjunctivae and EOM are normal. Pupils are equal, round, and reactive to light.  Neck: Normal range of motion.  Cardiovascular: Normal rate and regular rhythm.  Pulmonary/Chest: Effort normal and breath sounds normal.  Neurological: She is alert and oriented to person, place, and time.  No cranial nerve deficit.    Strength in the legs is grossly 55 in UE's. She is 5/5 LE'S now. Gait pattern has improved but she still takes short steps and shakes in stance a bit. There are some confidence issues as well. Skin: Skin is warm. Psychiatric: affect more appropriate. Generally much more dynamic and interactive.   Assessment & Plan:   1. 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 Myoclonus.  3. Seizure disorder  4. Chronic renal failure  5. Hypertension. Substantial elevation today 6. Anemia.  7. Thrombocytopenia - improved.  8. Mood -much improved     Plan:  1. Will make another referral to neuro-rehab to advance to a quad cane. I think the quad cane is a viable option.  2. Maintain klonopin to 0.5mg  at bid for myoclonus. Take after HD and before bed on HD days  3. Continue with keppra for seizure prophylaxis  4. Renal follow up per [facility] with HD ongoing. Suggested that she contact nephrology regarding options for her bp. A beta blocker would be a good choice given her movement disorder.  5. Follow up with me in about 3 months. 30 minutes of face to face patient care time were spent during this visit. All questions were encouraged and answered.

## 2014-10-22 ENCOUNTER — Encounter: Payer: Self-pay | Admitting: Physical Therapy

## 2014-10-22 ENCOUNTER — Ambulatory Visit: Payer: Medicaid Other | Attending: Physical Medicine & Rehabilitation | Admitting: Physical Therapy

## 2014-10-22 DIAGNOSIS — G253 Myoclonus: Secondary | ICD-10-CM | POA: Diagnosis present

## 2014-10-22 DIAGNOSIS — R269 Unspecified abnormalities of gait and mobility: Secondary | ICD-10-CM

## 2014-10-22 DIAGNOSIS — R2681 Unsteadiness on feet: Secondary | ICD-10-CM

## 2014-10-22 DIAGNOSIS — R531 Weakness: Secondary | ICD-10-CM | POA: Insufficient documentation

## 2014-10-23 NOTE — Therapy (Signed)
[facility] [address] [place], [place], [address] Phone: [phone]   Fax:  [phone]  Physical Therapy Evaluation  Patient Details  Name: Heather Sims MRN: [id number] Date of Birth: 1990/05/11 Referring Provider:  Meredith Staggers, MD  Encounter Date: 10/22/2014      PT End of Session - 10/22/14 1400    Visit Number 1   Number of Visits 4   Authorization Type Medicaid   PT Start Time 1400   PT Stop Time 1445   PT Time Calculation (min) 45 min   Equipment Utilized During Treatment Gait belt   Activity Tolerance Patient tolerated treatment well   Behavior During Therapy [facility] for tasks assessed/performed      Past Medical History  Diagnosis Date  . Pneumonia     'walking pneumonia'  . Seizures   . Anemia   . Cardiomyopathy   . Heart failure   . CHF (congestive heart failure)   . Ileitis   . Hypertension   . Brain injury   . Gait disorder 01/11/2013  . Family history of adverse reaction to anesthesia     Mother hard to wake up.  . Cardiac arrest 03/26/2011  . Depression   . Chronic kidney disease     TThS- [place]  . Renal disorder   . MPGN (membranoproliferative glomerulonephritis), type 2   . History of blood transfusion 11/2011    Past Surgical History  Procedure Laterality Date  . Renal biopsy    . Av fistula placement  12/18/2011    Procedure: ARTERIOVENOUS (AV) FISTULA CREATION;  Surgeon: Angelia Mould, MD;  Location: [facility] OR;  Service: Vascular;  Laterality: Left;  . Revison of arteriovenous fistula Left 3/71/0626    Procedure: PLICATION OF ARTERIOVENOUS FISTULA;  Surgeon: Elam Dutch, MD;  Location: [place];  Service: Vascular;  Laterality: Left;  . Insertion of dialysis catheter Right 08/06/2014    Procedure: INSERTION OF DIALYSIS CATHETER;  Surgeon: Elam Dutch, MD;  Location: [place];  Service: Vascular;  Laterality: Right;    There were no vitals filed for this  visit.  Visit Diagnosis:  Myoclonus  Abnormality of gait  Unsteadiness  Weakness      Subjective Assessment - 10/22/14 1400    Subjective This 24yo female had medical complications 11/24/8544 with MPGN (Membranoprolifeative Glumerulonephritis) causing kidney failure and cardiac event with no heart beat for 5 minutes. She has resulting Anoxic Brain Injury with Myoclonus causing ataxia of lower extremities. She also started hemodialysis at that time for kidney failure. She improved mobility & function with therapies and ongoing fitness /exercises. She was admitted to hospital on 08/06/2014 for aneurysm degeneration of left UE fistula. She has been limited in use of her arm since that surgery 2.5 months ago and her activity level including exercise program were very limited to non-existant. Her Myoclonus in lower extremity has increased limiting her mobility & balance so she was referred to PT for evaluation.                             Patient Stated Goals To use SBQC in home and eventually in community.    Currently in Pain? No/denies            [facility] PT Assessment - 10/22/14 1400    Assessment   Medical Diagnosis Gait Abnormality   Onset Date/Surgical Date 10/01/14  MD appt   Precautions  Precautions Fall  NO BP left UE   Precaution Comments NO BP LUE   Restrictions   Weight Bearing Restrictions No   Balance Screen   Has the patient fallen in the past 6 months Yes   How many times? 2  1x after fistula sg, 1x trying to use [facility] without training   Has the patient had a decrease in activity level because of a fear of falling?  No   Is the patient reluctant to leave their home because of a fear of falling?  No   Home Ecologist residence   Transport planner;Other relatives  parents and adult brother   Type of [place] --  inclined sidewalk, small threshold at door   Home Layout One level   [place] - 2  wheels;Cane - quad   Prior Function   Level of Independence Independent;Independent with household mobility with device;Independent with community mobility with device;Independent with gait;Independent with transfers   Leisure swimming, go out with friends   Coordination   Gross Motor Movements are Fluid and Coordinated No   Fine Motor Movements are Fluid and Coordinated Yes   Finger Nose Finger Test RUE & LUE no dysmetria or ataxia   Heel Shin Test decreased speed but able to control in plane of motion   Posture/Postural Control   Posture/Postural Control No significant limitations   ROM / Strength   AROM / PROM / Strength AROM;Strength   AROM   Overall AROM  Within functional limits for tasks performed   Strength   Overall Strength Deficits   Strength Assessment Site Hip;Knee;Ankle   Right/Left Hip Right;Left   Right Hip Flexion 4-/5   Right Hip Extension 4-/5   Right Hip ABduction 4/5   Left Hip Flexion 5/5   Left Hip Extension 4/5   Left Hip ABduction 5/5   Right/Left Knee Right;Left   Right Knee Flexion 4/5   Right Knee Extension 4/5   Left Knee Flexion 5/5   Left Knee Extension 5/5   Right/Left Ankle Right;Left   Right Ankle Dorsiflexion 5/5   Left Ankle Dorsiflexion 5/5   Transfers   Transfers Sit to Stand;Stand to Sit   Sit to Stand 6: Modified independent (Device/Increase time);Without upper extremity assist;From chair/3-in-1   Stand to Sit 6: Modified independent (Device/Increase time);Without upper extremity assist;To chair/3-in-1   Ambulation/Gait   Ambulation/Gait Yes   Ambulation/Gait Assistance 4: Min guard;6: Modified independent (Device/Increase time)  Min Guard with SBQC, Modified Indep. w/RW   Ambulation Distance (Feet) 150 Feet   Assistive device Small based quad cane;Straight cane;Rolling walker  arrived using RW, tested w/ SBQC & SPC w/ tripod tip   Gait Pattern Step-through pattern;Decreased stance time - right;Decreased hip/knee flexion - right;Wide  base of support   Ambulation Surface Indoor;Level   Gait velocity 2.81 ft/sec  2.81 ft/sec with RW & 0.32ft/sec with SBQC   Stairs Yes   Stairs Assistance 5: Supervision   Stairs Assistance Details (indicate cue type and reason) tested with 1 rail & SBQC   Stair Management Technique Alternating pattern;Step to pattern;One rail Right;With cane  ascend alternating, descend step-to with turning ~45* sidewa   Number of Stairs 4   Ramp 4: Min assist  SBQC   Curb 3: Mod assist  SBQC   Berg Balance Test   Sit to Stand Able to stand without using hands and stabilize independently   Standing Unsupported Able to stand safely  2 minutes   Sitting with Back Unsupported but Feet Supported on Floor or Stool Able to sit safely and securely 2 minutes   Stand to Sit Sits safely with minimal use of hands   Transfers Able to transfer safely, minor use of hands   Standing Unsupported with Eyes Closed Able to stand 10 seconds with supervision   Standing Ubsupported with Feet Together Able to place feet together independently and stand for 1 minute with supervision   From Standing, Reach Forward with Outstretched Arm Can reach forward >12 cm safely (5")   From Standing Position, Pick up Object from Floor Able to pick up shoe, needs supervision   From Standing Position, Turn to Look Behind Over each Shoulder Looks behind one side only/other side shows less weight shift   Turn 360 Degrees Needs close supervision or verbal cueing   Standing Unsupported, Alternately Place Feet on Step/Stool Able to complete >2 steps/needs minimal assist   Standing Unsupported, One Foot in Front Needs help to step but can hold 15 seconds   Standing on One Leg Tries to lift leg/unable to hold 3 seconds but remains standing independently   Total Score 39   Timed Up and Go Test   Normal TUG (seconds) 49.92  49.92sec with SBQC RUE, 56.08sec with SBQC LUE                                PT Long Term Goals -  10/22/14 1400    PT LONG TERM GOAL #1   Title Patient ambulates 150' around furniture to simulate household gait with cane modified independent. (Target 3rd visit after evaluation)   Baseline Patient ambulates 150' with [facility] with minimial assist / contact guard with required instruction from PT in proper technique.   Time 3   Period Weeks   Status New   PT LONG TERM GOAL #2   Title Patient negotiates ramps, curbs & stairs (1 rail) with minimal assist to guard with patient directing how family would assist.  (Target 3rd visit after evaluation)   Baseline Patient requires skilled instruction for technique on all barriers and minA on ramps & stairs with 1 rail; modA on curb   Time 3   Period Weeks   Status New   PT LONG TERM GOAL #3   Title Patient verbalizes understanding of well-rounded fitness plan / HEP.  (Target 3rd visit after evaluation)   Baseline Patient prior to May 2016 surgery was only swimming for exercise which limits benefits.   Time 3   Period Weeks   Status New               Plan - 10/22/14 1400    Clinical Impression Statement This 24yo female had medical complications 03/28/1094 with MPGN (Membranoprolifeative Glumerulonephritis) causing kidney failure and cardiac event with no heart beat for 5 minutes. She has resulting Anoxic Brain Injury with Myoclonus causing ataxia of lower extremities. She also started hemodialysis at that time for kidney failure. She improved mobility & function with therapies and ongoing fitness /exercises. She was admitted to hospital on 08/06/2014 for aneurysm degeneration of left UE fistula. She has been limited in use of her arm since that surgery 2.5 months ago and her activity level including exercise program were very limited to non-existant. Her Myoclonus in lower extremity has increased limiting her mobility & balance so she was referred to PT for evaluation. Testing indicates potential to ambulate  with cane in home initially then progress  to community with skilled training. She ambulated with [facility] with skilled instructions in technique with minimal guard contact including ramps. She required moderate assistance on curbs due to fear and lack of knowledge of proper technique. Testing indicates fall risk if she attempts to ambulate with cane currently without training. Berg Balance score 39/56.                          Pt will benefit from skilled therapeutic intervention in order to improve on the following deficits Abnormal gait;Decreased activity tolerance;Decreased balance;Decreased endurance;Decreased mobility;Decreased strength;Postural dysfunction   Rehab Potential Good   PT Frequency 1x / week   PT Duration 3 weeks   PT Treatment/Interventions ADLs/Self Care Home Management;DME Instruction;Gait training;Stair training;Functional mobility training;Therapeutic exercise;Balance training;Patient/family education;Neuromuscular re-education   PT Next Visit Plan Instruct in use of cane for household gait & initiate barriers.   PT Home Exercise Plan Standing balance    Consulted and Agree with Plan of Care Patient         Problem List Patient Active Problem List   Diagnosis Date Noted  . Gait disorder 01/11/2013  . End stage renal disease 02/03/2012  . Pulmonary edema 11/29/2011  . Hemorrhagic shock, due to intraabdominal bleeding following paracentesis 11/29/2011  . Hypervolemia 11/29/2011  . Ascites 11/29/2011  . Acute respiratory failure 11/28/2011  . Encephalopathy acute 11/26/2011  . Renal failure (ARF), acute on chronic 11/26/2011  . Cardiomyopathy 11/26/2011  . Acute renal failure 09/11/2011  . Dehydration 09/11/2011  . C. difficile colitis 09/11/2011  . Seizure disorder, grand mal 09/11/2011  . MPGN (membranoproliferative glomerulonephritis), type 2   . Ileitis 08/24/2011  . CKD (chronic kidney disease)- new chronic HD start this admit 08/24/2011  . Hypoalbuminemia 08/24/2011  . Myoclonus 07/03/2011  . Anoxic  brain damage 05/12/2011  . Seizures disorder 05/06/2011  . Hypertension 04/07/2011  . ? Viral Cardiomyopathy 04/07/2011  . Acute heart failure 03/26/2011  . Hypoxemia 03/26/2011  . Pneumonia 03/26/2011  . Anemia due to blood loss, acute 03/26/2011    Javarie Crisp PT, DPT 10/23/2014, 12:54 PM  [place] [address] [place], [place], [address] Phone: [phone]   Fax:  [phone]

## 2014-11-12 ENCOUNTER — Ambulatory Visit: Payer: Medicaid Other | Admitting: Physical Therapy

## 2014-11-19 ENCOUNTER — Ambulatory Visit: Payer: Medicaid Other | Admitting: Physical Therapy

## 2014-11-19 ENCOUNTER — Encounter: Payer: Self-pay | Admitting: Physical Therapy

## 2014-11-19 DIAGNOSIS — G253 Myoclonus: Secondary | ICD-10-CM

## 2014-11-19 DIAGNOSIS — R2681 Unsteadiness on feet: Secondary | ICD-10-CM

## 2014-11-19 DIAGNOSIS — R269 Unspecified abnormalities of gait and mobility: Secondary | ICD-10-CM

## 2014-11-19 DIAGNOSIS — R531 Weakness: Secondary | ICD-10-CM

## 2014-11-19 NOTE — Therapy (Signed)
[place] [address] [place], [place], [address] Phone: [phone]   Fax:  [phone]  Physical Therapy Treatment  Patient Details  Name: Heather Sims MRN: [id number] Date of Birth: 09-May-1990 Referring Provider:  Harvie Junior, MD  Encounter Date: 11/19/2014      PT End of Session - 11/19/14 1315    Visit Number 2   Number of Visits 4   Authorization Type Medicaid   Authorization Time Period 3 visits from 11/07/14 - 02/06/15   Authorization - Visit Number 1   Authorization - Number of Visits 3   PT Start Time 1315   PT Stop Time 1400   PT Time Calculation (min) 45 min   Equipment Utilized During Treatment Gait belt   Activity Tolerance Patient tolerated treatment well   Behavior During Therapy [facility] for tasks assessed/performed      Past Medical History  Diagnosis Date  . Pneumonia     'walking pneumonia'  . Seizures   . Anemia   . Cardiomyopathy   . Heart failure   . CHF (congestive heart failure)   . Ileitis   . Hypertension   . Brain injury   . Gait disorder 01/11/2013  . Family history of adverse reaction to anesthesia     Mother hard to wake up.  . Cardiac arrest 03/26/2011  . Depression   . Chronic kidney disease     TThS- [place]  . Renal disorder   . MPGN (membranoproliferative glomerulonephritis), type 2   . History of blood transfusion 11/2011    Past Surgical History  Procedure Laterality Date  . Renal biopsy    . Av fistula placement  12/18/2011    Procedure: ARTERIOVENOUS (AV) FISTULA CREATION;  Surgeon: Angelia Mould, MD;  Location: [facility] OR;  Service: Vascular;  Laterality: Left;  . Revison of arteriovenous fistula Left 7/35/3299    Procedure: PLICATION OF ARTERIOVENOUS FISTULA;  Surgeon: Elam Dutch, MD;  Location: [place];  Service: Vascular;  Laterality: Left;  . Insertion of dialysis catheter Right 08/06/2014    Procedure: INSERTION OF DIALYSIS CATHETER;   Surgeon: Elam Dutch, MD;  Location: [place];  Service: Vascular;  Laterality: Right;    There were no vitals filed for this visit.  Visit Diagnosis:  Myoclonus  Abnormality of gait  Unsteadiness  Weakness      Subjective Assessment - 11/19/14 1315    Subjective Patient reports she did apply for [employer] pro-bono program but they are not taking participants at this time. She denies any falls.   Currently in Pain? No/denies     Gait Training: PT demo, instructed in use of single point cane with tripod tip in non-dominant UE sequence, step length for step thru pattern & posture. Patient ambulated 100' X 2 with min Guard, verbal cues. Neuromuscular Re-ed: See patient education                            PT Education - 11/19/14 1315    Education provided Yes   Education Details HEP for balance & BIG PWR! moves in standing near counter with chair behind her for safety; difference in SBQC vs single point cane with tripod tip   Person(s) Educated Patient   Methods Explanation;Demonstration;Verbal cues;Handout   Comprehension Verbalized understanding;Returned demonstration;Verbal cues required;Tactile cues required;Need further instruction             PT Long Term  Goals - 10/22/14 1400    PT LONG TERM GOAL #1   Title Patient ambulates 150' around furniture to simulate household gait with cane modified independent. (Target 3rd visit after evaluation)   Baseline Patient ambulates 150' with [facility] with minimial assist / contact guard with required instruction from PT in proper technique.   Time 3   Period Weeks   Status New   PT LONG TERM GOAL #2   Title Patient negotiates ramps, curbs & stairs (1 rail) with minimal assist to guard with patient directing how family would assist.  (Target 3rd visit after evaluation)   Baseline Patient requires skilled instruction for technique on all barriers and minA on ramps & stairs with 1 rail; modA on curb   Time 3    Period Weeks   Status New   PT LONG TERM GOAL #3   Title Patient verbalizes understanding of well-rounded fitness plan / HEP.  (Target 3rd visit after evaluation)   Baseline Patient prior to May 2016 surgery was only swimming for exercise which limits benefits.   Time 3   Period Weeks   Status New               Plan - 11/19/14 1315    Clinical Impression Statement Patient improved gait with cane with cues on sequencing & step length. patient appears to understand initial HEP for balance & coordination and was challenged by exercises, safe near counter.    Pt will benefit from skilled therapeutic intervention in order to improve on the following deficits Abnormal gait;Decreased activity tolerance;Decreased balance;Decreased endurance;Decreased mobility;Decreased strength;Postural dysfunction   Rehab Potential Good   PT Frequency 1x / week   PT Duration 3 weeks   PT Treatment/Interventions ADLs/Self Care Home Management;DME Instruction;Gait training;Stair training;Functional mobility training;Therapeutic exercise;Balance training;Patient/family education;Neuromuscular re-education   PT Next Visit Plan Review use of cane for household gait & initiate barriers.   PT Home Exercise Plan Standing balance - alt. kicks, sidestepping /crossovers, braiding, tandem, marching, standing with one foot in lower cabinet for modified single leg stance, tandem stance; BIG PWR! moves standing posture, overhead reach with lateral wt shift, upper body rotation, lower body rotation /step with wt shift   Consulted and Agree with Plan of Care Patient        Problem List Patient Active Problem List   Diagnosis Date Noted  . Gait disorder 01/11/2013  . End stage renal disease 02/03/2012  . Pulmonary edema 11/29/2011  . Hemorrhagic shock, due to intraabdominal bleeding following paracentesis 11/29/2011  . Hypervolemia 11/29/2011  . Ascites 11/29/2011  . Acute respiratory failure 11/28/2011  .  Encephalopathy acute 11/26/2011  . Renal failure (ARF), acute on chronic 11/26/2011  . Cardiomyopathy 11/26/2011  . Acute renal failure 09/11/2011  . Dehydration 09/11/2011  . C. difficile colitis 09/11/2011  . Seizure disorder, grand mal 09/11/2011  . MPGN (membranoproliferative glomerulonephritis), type 2   . Ileitis 08/24/2011  . CKD (chronic kidney disease)- new chronic HD start this admit 08/24/2011  . Hypoalbuminemia 08/24/2011  . Myoclonus 07/03/2011  . Anoxic brain damage 05/12/2011  . Seizures disorder 05/06/2011  . Hypertension 04/07/2011  . ? Viral Cardiomyopathy 04/07/2011  . Acute heart failure 03/26/2011  . Hypoxemia 03/26/2011  . Pneumonia 03/26/2011  . Anemia due to blood loss, acute 03/26/2011    Wyllow Seigler PT, DPT 11/19/2014, 2:36 PM  [place] [address] [place], [place], [address] Phone: [phone]   Fax:  [phone]

## 2014-11-26 ENCOUNTER — Other Ambulatory Visit: Payer: Self-pay | Admitting: Physical Medicine & Rehabilitation

## 2014-11-28 ENCOUNTER — Encounter: Payer: Self-pay | Admitting: Physical Therapy

## 2014-11-28 ENCOUNTER — Ambulatory Visit: Payer: Medicaid Other | Attending: Physical Medicine & Rehabilitation | Admitting: Physical Therapy

## 2014-11-28 DIAGNOSIS — G253 Myoclonus: Secondary | ICD-10-CM | POA: Diagnosis present

## 2014-11-28 DIAGNOSIS — R531 Weakness: Secondary | ICD-10-CM | POA: Insufficient documentation

## 2014-11-28 DIAGNOSIS — R269 Unspecified abnormalities of gait and mobility: Secondary | ICD-10-CM | POA: Insufficient documentation

## 2014-11-28 DIAGNOSIS — R2681 Unsteadiness on feet: Secondary | ICD-10-CM | POA: Diagnosis present

## 2014-11-28 NOTE — Therapy (Signed)
[facility] [address] [place], [place], [address] Phone: [phone]   Fax:  [phone]  Physical Therapy Treatment  Patient Details  Name: Heather Sims MRN: [id number] Date of Birth: March 03, 1991 Referring Provider:  Harvie Junior, MD  Encounter Date: 11/28/2014      PT End of Session - 11/28/14 1230    Visit Number 3   Number of Visits 4   Authorization Type Medicaid   Authorization Time Period 3 visits from 11/07/14 - 02/06/15   Authorization - Visit Number 2   Authorization - Number of Visits 3   PT Start Time 1248   PT Stop Time 1330   PT Time Calculation (min) 42 min   Equipment Utilized During Treatment Gait belt   Activity Tolerance Patient tolerated treatment well   Behavior During Therapy [facility] for tasks assessed/performed      Past Medical History  Diagnosis Date  . Pneumonia     'walking pneumonia'  . Seizures   . Anemia   . Cardiomyopathy   . Heart failure   . CHF (congestive heart failure)   . Ileitis   . Hypertension   . Brain injury   . Gait disorder 01/11/2013  . Family history of adverse reaction to anesthesia     Mother hard to wake up.  . Cardiac arrest 03/26/2011  . Depression   . Chronic kidney disease     [facility]- [place]  . Renal disorder   . MPGN (membranoproliferative glomerulonephritis), type 2   . History of blood transfusion 11/2011    Past Surgical History  Procedure Laterality Date  . Renal biopsy    . Av fistula placement  12/18/2011    Procedure: ARTERIOVENOUS (AV) FISTULA CREATION;  Surgeon: Angelia Mould, MD;  Location: [facility];  Service: Vascular;  Laterality: Left;  . Revison of arteriovenous fistula Left 0/63/0160    Procedure: PLICATION OF ARTERIOVENOUS FISTULA;  Surgeon: Elam Dutch, MD;  Location: [facility];  Service: Vascular;  Laterality: Left;  . Insertion of dialysis catheter Right 08/06/2014    Procedure: INSERTION OF DIALYSIS CATHETER;   Surgeon: Elam Dutch, MD;  Location: [facility];  Service: Vascular;  Laterality: Right;    There were no vitals filed for this visit.  Visit Diagnosis:  Myoclonus  Abnormality of gait  Unsteadiness  Weakness      Subjective Assessment - 11/28/14 1254    Subjective (p) She has been doing her exercises. She just bought a cane on the way to PT today.   Currently in Pain? (p) No/denies                         [facility] Adult PT Treatment/Exercise - 11/28/14 1230    Ambulation/Gait   Ambulation/Gait Yes   Ambulation/Gait Assistance 4: Min guard;5: Supervision  initially min guard progressed to SBA   Ambulation/Gait Assistance Details PT cued sequence, step length, posture, carrying cup of water. Using table or counter for safe environment.  Pt purchased cane & tripod tip. PT set up.   Ambulation Distance (Feet) 150 Feet  150' X 3 with cane, 300' with RW   Assistive device Straight cane;Rolling walker   Gait Pattern Step-through pattern;Decreased stance time - right;Decreased hip/knee flexion - right;Wide base of support   Ambulation Surface Indoor;Level   Stairs Yes   Stairs Assistance 5: Supervision   Stairs Assistance Details (indicate cue type and reason) verbal cues on  technique with 1 rail & cane   Stair Management Technique One rail Right;With cane;Step to pattern;Forwards   Number of Stairs 4   Ramp 4: Min assist;5: Supervision  tripod tip single point cane MinA; RW SBA   Ramp Details (indicate cue type and reason) verbal cues on technique / sequence & posture.   Curb 4: Min assist;5: Supervision  Min A cane; SBA RW   Curb Details (indicate cue type and reason) verbal cues on sequence / technique & step length   Gait Comments worked on increaesing speed with longer steps & fluent gait with RW- manual, verbal & visual cues                PT Education - 11/28/14 1230    Education provided Yes   Education Details Use of table/counter or family  supervision for gait with cane, tripod tip alignment on cane,    Person(s) Educated Patient;Parent(s)   Methods Explanation;Demonstration;Tactile cues;Verbal cues   Comprehension Verbalized understanding;Returned demonstration;Verbal cues required;Tactile cues required;Need further instruction             PT Long Term Goals - 10/22/14 1400    PT LONG TERM GOAL #1   Title Patient ambulates 150' around furniture to simulate household gait with cane modified independent. (Target 3rd visit after evaluation)   Baseline Patient ambulates 150' with [facility] with minimial assist / contact guard with required instruction from PT in proper technique.   Time 3   Period Weeks   Status New   PT LONG TERM GOAL #2   Title Patient negotiates ramps, curbs & stairs (1 rail) with minimal assist to guard with patient directing how family would assist.  (Target 3rd visit after evaluation)   Baseline Patient requires skilled instruction for technique on all barriers and minA on ramps & stairs with 1 rail; modA on curb   Time 3   Period Weeks   Status New   PT LONG TERM GOAL #3   Title Patient verbalizes understanding of well-rounded fitness plan / HEP.  (Target 3rd visit after evaluation)   Baseline Patient prior to May 2016 surgery was only swimming for exercise which limits benefits.   Time 3   Period Weeks   Status New               Plan - 11/28/14 1230    Clinical Impression Statement Patient appears safe to ambulate with her cane on level surfaces with family supervision or near support like a table or counter. Patient was able to improve her gait velocity with RW with improved fluency& step length.   Pt will benefit from skilled therapeutic intervention in order to improve on the following deficits Abnormal gait;Decreased activity tolerance;Decreased balance;Decreased endurance;Decreased mobility;Decreased strength;Postural dysfunction   Rehab Potential Good   PT Frequency 1x / week   PT  Duration 3 weeks   PT Treatment/Interventions ADLs/Self Care Home Management;DME Instruction;Gait training;Stair training;Functional mobility training;Therapeutic exercise;Balance training;Patient/family education;Neuromuscular re-education   PT Next Visit Plan assess LTGs for discharge, check & update HEP    PT Home Exercise Plan Standing balance - alt. kicks, sidestepping /crossovers, braiding, tandem, marching, standing with one foot in lower cabinet for modified single leg stance, tandem stance; BIG PWR! moves standing posture, overhead reach with lateral wt shift, upper body rotation, lower body rotation /step with wt shift   Consulted and Agree with Plan of Care Patient        Problem List Patient Active Problem List  Diagnosis Date Noted  . Gait disorder 01/11/2013  . End stage renal disease 02/03/2012  . Pulmonary edema 11/29/2011  . Hemorrhagic shock, due to intraabdominal bleeding following paracentesis 11/29/2011  . Hypervolemia 11/29/2011  . Ascites 11/29/2011  . Acute respiratory failure 11/28/2011  . Encephalopathy acute 11/26/2011  . Renal failure (ARF), acute on chronic 11/26/2011  . Cardiomyopathy 11/26/2011  . Acute renal failure 09/11/2011  . Dehydration 09/11/2011  . C. difficile colitis 09/11/2011  . Seizure disorder, grand mal 09/11/2011  . MPGN (membranoproliferative glomerulonephritis), type 2   . Ileitis 08/24/2011  . CKD (chronic kidney disease)- new chronic HD start this admit 08/24/2011  . Hypoalbuminemia 08/24/2011  . Myoclonus 07/03/2011  . Anoxic brain damage 05/12/2011  . Seizures disorder 05/06/2011  . Hypertension 04/07/2011  . ? Viral Cardiomyopathy 04/07/2011  . Acute heart failure 03/26/2011  . Hypoxemia 03/26/2011  . Pneumonia 03/26/2011  . Anemia due to blood loss, acute 03/26/2011    Syd Newsome PT, DPT 11/28/2014, 7:29 PM  [facility] [address] [place], [place],  [address] Phone: [phone]   Fax:  [phone]

## 2014-12-12 ENCOUNTER — Ambulatory Visit: Payer: Medicaid Other | Admitting: Physical Therapy

## 2014-12-24 ENCOUNTER — Ambulatory Visit: Payer: Medicaid Other | Attending: Physical Medicine & Rehabilitation | Admitting: Physical Therapy

## 2014-12-24 DIAGNOSIS — R531 Weakness: Secondary | ICD-10-CM | POA: Insufficient documentation

## 2014-12-24 DIAGNOSIS — R2681 Unsteadiness on feet: Secondary | ICD-10-CM | POA: Insufficient documentation

## 2014-12-24 DIAGNOSIS — G253 Myoclonus: Secondary | ICD-10-CM | POA: Insufficient documentation

## 2014-12-24 DIAGNOSIS — R269 Unspecified abnormalities of gait and mobility: Secondary | ICD-10-CM | POA: Insufficient documentation

## 2014-12-31 ENCOUNTER — Encounter: Payer: Medicaid Other | Admitting: Physical Medicine & Rehabilitation

## 2014-12-31 ENCOUNTER — Ambulatory Visit: Payer: Medicaid Other | Admitting: Physical Therapy

## 2014-12-31 DIAGNOSIS — G253 Myoclonus: Secondary | ICD-10-CM

## 2014-12-31 DIAGNOSIS — R269 Unspecified abnormalities of gait and mobility: Secondary | ICD-10-CM

## 2014-12-31 DIAGNOSIS — R2681 Unsteadiness on feet: Secondary | ICD-10-CM | POA: Diagnosis present

## 2014-12-31 DIAGNOSIS — R531 Weakness: Secondary | ICD-10-CM

## 2014-12-31 NOTE — Therapy (Signed)
[facility] [address] [place], [place], [address] Phone: [phone]   Fax:  [phone]  Physical Therapy Treatment  Patient Details  Name: Heather Sims MRN: [id number] Date of Birth: 03/25/1990 Referring Provider:  Williams, Dwight M, MD  Encounter Date: 12/31/2014  PHYSICAL THERAPY DISCHARGE SUMMARY  Visits from Start of Care: 4  Current functional level related to goals / functional outcomes: See below   Remaining deficits: See below   Education / Equipment: Ongoing, well-rounded HEP / fitness plan to include strength, flexibility, balance & endurance. Plan: Patient agrees to discharge.  Patient goals were met. Patient is being discharged due to meeting the stated rehab goals.  ?????           PT End of Session - 12/31/14 0845    Visit Number 4   Number of Visits 4   Authorization Type Medicaid   Authorization Time Period 3 visits from 11/07/14 - 02/06/15   Authorization - Visit Number 3   Authorization - Number of Visits 3   PT Start Time 0938   PT Stop Time 1016   PT Time Calculation (min) 38 min   Equipment Utilized During Treatment Gait belt   Activity Tolerance Patient tolerated treatment well   Behavior During Therapy WFL for tasks assessed/performed      Past Medical History  Diagnosis Date  . Pneumonia     'walking pneumonia'  . Seizures   . Anemia   . Cardiomyopathy   . Heart failure   . CHF (congestive heart failure)   . Ileitis   . Hypertension   . Brain injury   . Gait disorder 01/11/2013  . Family history of adverse reaction to anesthesia     Mother hard to wake up.  . Cardiac arrest 03/26/2011  . Depression   . Chronic kidney disease     [facility]- [place]  . Renal disorder   . MPGN (membranoproliferative glomerulonephritis), type 2   . History of blood transfusion 11/2011    Past Surgical History  Procedure Laterality Date  . Renal biopsy    . Av fistula  placement  12/18/2011    Procedure: ARTERIOVENOUS (AV) FISTULA CREATION;  Surgeon: Christopher S Dickson, MD;  Location: [facility] OR;  Service: Vascular;  Laterality: Left;  . Revison of arteriovenous fistula Left 08/06/2014    Procedure: PLICATION OF ARTERIOVENOUS FISTULA;  Surgeon: Charles E Fields, MD;  Location: [facility] OR;  Service: Vascular;  Laterality: Left;  . Insertion of dialysis catheter Right 08/06/2014    Procedure: INSERTION OF DIALYSIS CATHETER;  Surgeon: Charles E Fields, MD;  Location: [facility] OR;  Service: Vascular;  Laterality: Right;    There were no vitals filed for this visit.  Visit Diagnosis:  Myoclonus  Abnormality of gait  Unsteadiness  Weakness      Subjective Assessment - 12/31/14 0944    Subjective (p) practicing with cane in house but uses RW to get around house still due to not cleared by PT yet.   Currently in Pain? (p) No/denies                         [facility] Adult PT Treatment/Exercise - 12/31/14 1023    Transfers   Transfers Sit to Stand;Stand to Sit   Sit to Stand 6: Modified independent (Device/Increase time);With upper extremity assist;With armrests;From chair/3-in-1   Stand to Sit 6: Modified independent (Device/Increase time);With upper extremity assist;With armrests;To chair/3-in-1     Ambulation/Gait   Ambulation/Gait Yes   Ambulation/Gait Assistance 6: Modified independent (Device/Increase time);4: Min guard  mod independent 150' house, minguard 250'    Ambulation/Gait Assistance Details Modified Independent household up to 150'; SBA or Min guard for longer step length, grass, ramps, curbs,    Ambulation Distance (Feet) 150 Feet  150' carrying cup & negotiating furniture; 300' wi/ min guar   Assistive device Straight cane   Gait Pattern Step-through pattern;Decreased stance time - right;Decreased hip/knee flexion - right;Wide base of support   Ambulation Surface Indoor;Level;Outdoor;Paved;Gravel;Grass   Stairs Yes   Stairs Assistance 5:  Supervision   Stairs Assistance Details (indicate cue type and reason) various location of single rail   Stair Management Technique One rail Right;With cane;Step to pattern;Forwards;One rail Left;Alternating pattern;Two rails  alternating requires 2 rails   Number of Stairs 4  3 reps   Door Management 5: Supervision  single point cane   Door Managment Details (indicate cue type and reason) cues on technique   Ramp 4: Min assist  tripod tip single point cane MinA;   Ramp Details (indicate cue type and reason) patient able to verbalize proper technique & assistance   Curb 4: Min assist  Min A cane;   Curb Details (indicate cue type and reason) patient able to verbalize proper technique & assistance   Gait Comments worked on increaesing speed with longer steps & fluent gait with RW- manual, verbal & visual cues                PT Education - 12/31/14 0845    Education provided Yes   Education Details Patient verbalized understanding of progressive, ongoing fitness plan   Person(s) Educated Patient;Parent(s)   Methods Explanation   Comprehension Verbalized understanding             PT Long Term Goals - 12/31/14 1015    PT LONG TERM GOAL #1   Title Patient ambulates 150' around furniture to simulate household gait with cane modified independent. (Target 3rd visit after evaluation)   Baseline MET 12/31/2014   Time 3   Period Weeks   Status Achieved   PT LONG TERM GOAL #2   Title Patient negotiates ramps, curbs & stairs (1 rail) with minimal assist to guard with patient directing how family would assist.  (Target 3rd visit after evaluation)   Baseline MET 12/31/2014   Time 3   Period Weeks   Status Achieved   PT LONG TERM GOAL #3   Title Patient verbalizes understanding of well-rounded fitness plan / HEP.  (Target 3rd visit after evaluation)   Baseline MET 12/31/2014   Time 3   Period Weeks   Status Achieved               Plan - 12/31/14 0845    Clinical  Impression Statement Patient met all LTGs. She appears safe to use a cane for household gait modified independent and community level with supervision / minimal guard.   Pt will benefit from skilled therapeutic intervention in order to improve on the following deficits Abnormal gait;Decreased activity tolerance;Decreased balance;Decreased endurance;Decreased mobility;Decreased strength;Postural dysfunction   Rehab Potential Good   PT Frequency 1x / week   PT Duration 3 weeks   PT Treatment/Interventions ADLs/Self Care Home Management;DME Instruction;Gait training;Stair training;Functional mobility training;Therapeutic exercise;Balance training;Patient/family education;Neuromuscular re-education   PT Next Visit Plan Discharge PT   Consulted and Agree with Plan of Care Patient        Problem List Patient Active   Problem List   Diagnosis Date Noted  . Gait disorder 01/11/2013  . End stage renal disease ([place]) 02/03/2012  . Pulmonary edema 11/29/2011  . Hemorrhagic shock, due to intraabdominal bleeding following paracentesis 11/29/2011  . Hypervolemia 11/29/2011  . Ascites 11/29/2011  . Acute respiratory failure ([place]) 11/28/2011  . Encephalopathy acute 11/26/2011  . Renal failure (ARF), acute on chronic ([facility]) 11/26/2011  . Cardiomyopathy ([place]) 11/26/2011  . Acute renal failure ([place]) 09/11/2011  . Dehydration 09/11/2011  . C. difficile colitis 09/11/2011  . Seizure disorder, grand mal ([place]) 09/11/2011  . MPGN (membranoproliferative glomerulonephritis), type 2   . Ileitis 08/24/2011  . CKD (chronic kidney disease)- new chronic HD start this admit 08/24/2011  . Hypoalbuminemia 08/24/2011  . Myoclonus 07/03/2011  . Anoxic brain damage ([place]) 05/12/2011  . Seizures disorder 05/06/2011  . Hypertension 04/07/2011  . ? Viral Cardiomyopathy 04/07/2011  . Acute heart failure ([place]) 03/26/2011  . Hypoxemia 03/26/2011  . Pneumonia 03/26/2011  . Anemia due to blood loss, acute 03/26/2011     Cardarius Senat PT, DPT 12/31/2014, 5:16 PM  [facility] [address] [place], [place], [address] Phone: [phone]   Fax:  [phone]

## 2015-01-24 ENCOUNTER — Ambulatory Visit: Payer: Medicaid Other | Admitting: Adult Health

## 2015-01-25 ENCOUNTER — Ambulatory Visit: Payer: Medicaid Other | Admitting: Adult Health

## 2015-01-28 ENCOUNTER — Ambulatory Visit (INDEPENDENT_AMBULATORY_CARE_PROVIDER_SITE_OTHER): Payer: Medicaid Other | Admitting: Adult Health

## 2015-01-28 ENCOUNTER — Encounter: Payer: Self-pay | Admitting: Adult Health

## 2015-01-28 VITALS — BP 138/88 | HR 88 | Resp 20 | Ht 62.0 in | Wt 118.0 lb

## 2015-01-28 DIAGNOSIS — R569 Unspecified convulsions: Secondary | ICD-10-CM

## 2015-01-28 DIAGNOSIS — R269 Unspecified abnormalities of gait and mobility: Secondary | ICD-10-CM | POA: Diagnosis not present

## 2015-01-28 NOTE — Progress Notes (Signed)
PATIENT: Heather Sims DOB: 11-Nov-1990  REASON FOR VISIT: follow up- seizures. Gait instablility HISTORY FROM: patient  HISTORY OF PRESENT ILLNESS: Heather Sims is a 24 year old female with a history of a prior cardiac arrest and anoxic brain injury resulting in gait instability and seizures. She returns today for follow-up. She is currently taking Keppra 250 mg twice a day. She is tolerating this well. Denies any seizure events. She is able to complete all ADLs independently. She will occasionally drive a car if she has a family member to ride with her otherwise she does not drive. Patient continues to use a walker when ambulating. She states that she will use a cane but only when she is at home. Denies any recent falls. She continues with hemodialysis. She is currently trying to fulfill the requirements to be on the kidney transplant list. She denies any new medical issues. She returns today for an evaluation.  HISTORY 01/24/14 (WILLIS): Heather Sims is a 24 year old right-handed black female with a history of a prior cardiac arrest, and anoxia of the brain with subsequent issues with gait instability, and seizures. The patient has done quite well since last seen one year ago on very low-dose Keppra. The patient takes 250 mg twice daily. The patient is on hemodialysis. The patient currently does not drive a car, but she is starting to learn how to do this. The patient denies any other significant medical issues that have come up since last seen. The patient is followed by Dr. Naaman Plummer from rehabilitation. She comes in today for routine reevaluation.  REVIEW OF SYSTEMS: Out of a complete 14 system review of symptoms, the patient complains only of the following symptoms, and all other reviewed systems are negative.  See history of present illness  ALLERGIES: Allergies  Allergen Reactions  . Pollen Extract Swelling    HOME MEDICATIONS: Outpatient Prescriptions Prior to Visit  Medication Sig  Dispense Refill  . acetaminophen (TYLENOL) 325 MG tablet Take 2 tablets (650 mg total) by mouth every 4 (four) hours as needed (pain).    . Cinacalcet HCl (SENSIPAR PO) Take 60 mg by mouth daily.     . clonazePAM (KLONOPIN) 0.5 MG tablet Take 1 tablet (0.5 mg total) by mouth 2 (two) times daily. 60 tablet 2  . clonazePAM (KLONOPIN) 0.5 MG tablet Take 1 tablet (0.5 mg total) by mouth 2 (two) times daily. 60 tablet 2  . levETIRAcetam (KEPPRA) 250 MG tablet Take 1 tablet (250 mg total) by mouth 2 (two) times daily. 60 tablet 11  . multivitamin (RENA-VIT) TABS tablet Take 1 tablet by mouth at bedtime. 30 tablet 1  . sevelamer carbonate (RENVELA) 800 MG tablet Take 800 mg by mouth 3 (three) times daily with meals.     No facility-administered medications prior to visit.    PAST MEDICAL HISTORY: Past Medical History  Diagnosis Date  . Pneumonia     'walking pneumonia'  . Seizures   . Anemia   . Cardiomyopathy   . Heart failure   . CHF (congestive heart failure)   . Ileitis   . Hypertension   . Brain injury   . Gait disorder 01/11/2013  . Family history of adverse reaction to anesthesia     Mother hard to wake up.  . Cardiac arrest ([place]) 03/26/2011  . Depression   . Chronic kidney disease     [facility]- [place]  . Renal disorder   . MPGN (membranoproliferative glomerulonephritis), type 2   . History  of blood transfusion 11/2011    PAST SURGICAL HISTORY: Past Surgical History  Procedure Laterality Date  . Renal biopsy    . Av fistula placement  12/18/2011    Procedure: ARTERIOVENOUS (AV) FISTULA CREATION;  Surgeon: Angelia Mould, MD;  Location: [facility] OR;  Service: Vascular;  Laterality: Left;  . Revison of arteriovenous fistula Left 07/15/9561    Procedure: PLICATION OF ARTERIOVENOUS FISTULA;  Surgeon: Elam Dutch, MD;  Location: [facility];  Service: Vascular;  Laterality: Left;  . Insertion of dialysis catheter Right 08/06/2014    Procedure: INSERTION OF DIALYSIS CATHETER;   Surgeon: Elam Dutch, MD;  Location: [facility] OR;  Service: Vascular;  Laterality: Right;    FAMILY HISTORY: Family History  Problem Relation Age of Onset  . Hypertension Mother   . Diabetes Father   . Hyperlipidemia Father     SOCIAL HISTORY: Social History   Social History  . Marital Status: Single    Spouse Name: N/A  . Number of Children: 0  . Years of Education: college jr   Occupational History  . Not on file.   Social History Main Topics  . Smoking status: Never Smoker   . Smokeless tobacco: Never Used  . Alcohol Use: No  . Drug Use: No  . Sexual Activity: No   Other Topics Concern  . Not on file   Social History Narrative   ** Merged History Encounter **          PHYSICAL EXAM  Filed Vitals:   01/28/15 1431  BP: 138/88  Pulse: 88  Resp: 20  Height: 5\' 2"  (1.575 m)  Weight: 118 lb (53.524 kg)   There is no weight on file to calculate BMI.  Generalized: Well developed, in no acute distress   Neurological examination  Mentation: Alert oriented to time, place, history taking. Follows all commands speech and language fluent Cranial nerve II-XII: Pupils were equal round reactive to light. Extraocular movements were full, visual field were full on confrontational test. Facial sensation and strength were normal. Uvula tongue midline. Head turning and shoulder shrug  were normal and symmetric. Motor: The motor testing reveals 5 over 5 strength of all 4 extremities. Good symmetric motor tone is noted throughout.  Sensory: Sensory testing is intact to soft touch on all 4 extremities. No evidence of extinction is noted.  Coordination: Cerebellar testing reveals good finger-nose-finger and heel-to-shin bilaterally.  Gait and station: Patient uses a walker when ambulate. Tandem gait not attempted. Romberg is negative. Reflexes: Deep tendon reflexes are symmetric and normal bilaterally.   DIAGNOSTIC DATA (LABS, IMAGING, TESTING) - I reviewed patient records,  labs, notes, testing and imaging myself where available  ASSESSMENT AND PLAN 24 y.o. year old female  has a past medical history of Pneumonia; Seizures; Anemia; Cardiomyopathy; Heart failure; CHF (congestive heart failure); Ileitis; Hypertension; Brain injury; Gait disorder (01/11/2013); Family history of adverse reaction to anesthesia; Cardiac arrest ([place]) (03/26/2011); Depression; Chronic kidney disease; Renal disorder; MPGN (membranoproliferative glomerulonephritis), type 2; and History of blood transfusion (11/2011). here with:  1. Seizure disorder 2. Gait disorder  Overall the patient is doing well. She'll continue on Keppra 250 mg twice a day. Patient advised that if she has any seizure events she should let us know. She will follow-up in one year or sooner if needed.  Ward Givens, MSN, NP-C 01/28/2015, 2:27 PM [facility] [address] [place], [place] [address] [phone]

## 2015-01-28 NOTE — Patient Instructions (Signed)
Continue Keppra 250 mg twice a day If your symptoms worsen or you develop new symptoms please let us know.   

## 2015-01-28 NOTE — Progress Notes (Signed)
I have read the note, and I agree with the clinical assessment and plan.  WILLIS,CHARLES KEITH   

## 2015-02-06 ENCOUNTER — Encounter: Payer: Medicaid Other | Attending: Physical Medicine & Rehabilitation | Admitting: Physical Medicine & Rehabilitation

## 2015-02-06 DIAGNOSIS — R269 Unspecified abnormalities of gait and mobility: Secondary | ICD-10-CM | POA: Insufficient documentation

## 2015-02-06 DIAGNOSIS — F329 Major depressive disorder, single episode, unspecified: Secondary | ICD-10-CM | POA: Insufficient documentation

## 2015-02-06 DIAGNOSIS — D649 Anemia, unspecified: Secondary | ICD-10-CM | POA: Insufficient documentation

## 2015-02-06 DIAGNOSIS — I509 Heart failure, unspecified: Secondary | ICD-10-CM | POA: Insufficient documentation

## 2015-02-06 DIAGNOSIS — G253 Myoclonus: Secondary | ICD-10-CM | POA: Insufficient documentation

## 2015-02-06 DIAGNOSIS — Z8674 Personal history of sudden cardiac arrest: Secondary | ICD-10-CM | POA: Insufficient documentation

## 2015-02-06 DIAGNOSIS — N186 End stage renal disease: Secondary | ICD-10-CM | POA: Insufficient documentation

## 2015-02-06 DIAGNOSIS — I469 Cardiac arrest, cause unspecified: Secondary | ICD-10-CM | POA: Insufficient documentation

## 2015-02-06 DIAGNOSIS — I429 Cardiomyopathy, unspecified: Secondary | ICD-10-CM | POA: Insufficient documentation

## 2015-02-06 DIAGNOSIS — I129 Hypertensive chronic kidney disease with stage 1 through stage 4 chronic kidney disease, or unspecified chronic kidney disease: Secondary | ICD-10-CM | POA: Insufficient documentation

## 2015-02-06 DIAGNOSIS — D696 Thrombocytopenia, unspecified: Secondary | ICD-10-CM | POA: Insufficient documentation

## 2015-02-06 DIAGNOSIS — G931 Anoxic brain damage, not elsewhere classified: Secondary | ICD-10-CM | POA: Insufficient documentation

## 2015-02-06 DIAGNOSIS — G40909 Epilepsy, unspecified, not intractable, without status epilepticus: Secondary | ICD-10-CM | POA: Insufficient documentation

## 2015-02-10 ENCOUNTER — Other Ambulatory Visit: Payer: Self-pay | Admitting: Neurology

## 2015-02-18 ENCOUNTER — Encounter (HOSPITAL_BASED_OUTPATIENT_CLINIC_OR_DEPARTMENT_OTHER): Payer: Medicaid Other | Admitting: Physical Medicine & Rehabilitation

## 2015-02-18 ENCOUNTER — Encounter: Payer: Self-pay | Admitting: Physical Medicine & Rehabilitation

## 2015-02-18 ENCOUNTER — Ambulatory Visit: Payer: Self-pay | Admitting: Physical Medicine & Rehabilitation

## 2015-02-18 VITALS — BP 139/86 | HR 77

## 2015-02-18 DIAGNOSIS — N186 End stage renal disease: Secondary | ICD-10-CM

## 2015-02-18 DIAGNOSIS — G253 Myoclonus: Secondary | ICD-10-CM | POA: Diagnosis not present

## 2015-02-18 DIAGNOSIS — I509 Heart failure, unspecified: Secondary | ICD-10-CM | POA: Diagnosis not present

## 2015-02-18 DIAGNOSIS — F329 Major depressive disorder, single episode, unspecified: Secondary | ICD-10-CM | POA: Diagnosis not present

## 2015-02-18 DIAGNOSIS — R269 Unspecified abnormalities of gait and mobility: Secondary | ICD-10-CM | POA: Diagnosis not present

## 2015-02-18 DIAGNOSIS — D696 Thrombocytopenia, unspecified: Secondary | ICD-10-CM | POA: Diagnosis not present

## 2015-02-18 DIAGNOSIS — G40909 Epilepsy, unspecified, not intractable, without status epilepticus: Secondary | ICD-10-CM | POA: Diagnosis not present

## 2015-02-18 DIAGNOSIS — D649 Anemia, unspecified: Secondary | ICD-10-CM | POA: Diagnosis not present

## 2015-02-18 DIAGNOSIS — G931 Anoxic brain damage, not elsewhere classified: Secondary | ICD-10-CM | POA: Diagnosis not present

## 2015-02-18 DIAGNOSIS — I469 Cardiac arrest, cause unspecified: Secondary | ICD-10-CM | POA: Diagnosis not present

## 2015-02-18 DIAGNOSIS — Z8674 Personal history of sudden cardiac arrest: Secondary | ICD-10-CM | POA: Diagnosis not present

## 2015-02-18 DIAGNOSIS — I129 Hypertensive chronic kidney disease with stage 1 through stage 4 chronic kidney disease, or unspecified chronic kidney disease: Secondary | ICD-10-CM | POA: Diagnosis not present

## 2015-02-18 DIAGNOSIS — I429 Cardiomyopathy, unspecified: Secondary | ICD-10-CM | POA: Diagnosis not present

## 2015-02-18 MED ORDER — CLONAZEPAM 0.5 MG PO TABS: 0.5000 mg | ORAL_TABLET | Freq: Two times a day (BID) | ORAL | Status: DC

## 2015-02-18 NOTE — Progress Notes (Signed)
Subjective:    Patient ID: Heather Sims, female    DOB: [birth year], 24 y.o.   MRN: [id number]  HPI   Heather Sims is here regarding her gait disorder and myoclonus. She went to PT who advanced her to  [facility] while in the house and advised a walker for longer dx outside the house.   She continues on HD MWF. She generally feels a little weak the day of HD but otherwise feels pretty strong.      Pain Inventory Average Pain 0 Pain Right Now 0 My pain is NA  In the last 24 hours, has pain interfered with the following? General activity 4 Relation with others 4 Enjoyment of life 6 What TIME of day is your pain at its worst? NA Sleep (in general) NA  Pain is worse with: NA Pain improves with: Na Relief from Meds: 10  Mobility walk with assistance use a cane use a walker do you drive?  no Do you have any goals in this area?  yes  Function Do you have any goals in this area?  no  Neuro/Psych No problems in this area  Prior Studies Any changes since last visit?  no  Physicians involved in your care Any changes since last visit?  no   Family History  Problem Relation Age of Onset  . Hypertension Mother   . Diabetes Father   . Hyperlipidemia Father    Social History   Social History  . Marital Status: Single    Spouse Name: N/A  . Number of Children: 0  . Years of Education: college jr   Social History Main Topics  . Smoking status: Never Smoker   . Smokeless tobacco: Never Used  . Alcohol Use: No  . Drug Use: No  . Sexual Activity: No   Other Topics Concern  . None   Social History Narrative   ** Merged History Encounter **       Past Surgical History  Procedure Laterality Date  . Renal biopsy    . Av fistula placement  12/18/2011    Procedure: ARTERIOVENOUS (AV) FISTULA CREATION;  Surgeon: Angelia Mould, MD;  Location: [facility] OR;  Service: Vascular;  Laterality: Left;  . Revison of arteriovenous fistula Left 08/06/2014    Procedure:  PLICATION OF ARTERIOVENOUS FISTULA;  Surgeon: Elam Dutch, MD;  Location: [place];  Service: Vascular;  Laterality: Left;  . Insertion of dialysis catheter Right 08/06/2014    Procedure: INSERTION OF DIALYSIS CATHETER;  Surgeon: Elam Dutch, MD;  Location: [place];  Service: Vascular;  Laterality: Right;   Past Medical History  Diagnosis Date  . Pneumonia     'walking pneumonia'  . Seizures ([place])   . Anemia   . Cardiomyopathy   . Heart failure   . CHF (congestive heart failure) ([place])   . Ileitis   . Hypertension   . Brain injury ([place])   . Gait disorder 01/11/2013  . Family history of adverse reaction to anesthesia     Mother hard to wake up.  . Cardiac arrest ([place]) 03/26/2011  . Depression   . Chronic kidney disease     TThS- [place]  . Renal disorder   . MPGN (membranoproliferative glomerulonephritis), type 2   . History of blood transfusion 11/2011   BP 139/86 mmHg  Pulse 77  SpO2 100%  Opioid Risk Score:   Fall Risk Score:  `1  Depression screen PHQ 2/9  Depression screen [facility] 2/9  07/02/2014  Decreased Interest 1  Down, Depressed, Hopeless 1  PHQ - 2 Score 2  Altered sleeping 1  Tired, decreased energy 1  Change in appetite 0  Feeling bad or failure about yourself  2  Trouble concentrating 0  Moving slowly or fidgety/restless 0  Suicidal thoughts 0  PHQ-9 Score 6     Review of Systems  All other systems reviewed and are negative.      Objective:   Physical Exam  Constitutional: She is oriented to person, place, and time. She appears well-developed and well-nourished.  HENT:  Head: Normocephalic and atraumatic.  Eyes: Conjunctivae and EOM are normal. Pupils are equal, round, and reactive to light.  Neck: Normal range of motion.  Cardiovascular: Normal rate and regular rhythm.  Pulmonary/Chest: Effort normal and breath sounds normal.  Neurological: She is alert and oriented to person, place, and time. No cranial nerve deficit.    Strength in  the legs is grossly 55 in UE's. She is 5/5 LE'S now. Gait pattern has improved but she still takes short steps and shakes in stance a bit. Gait was very choppy and steppage in quality when she used her quad cane. With the walker she takes much bigger steps and is much more fluid.  Psychiatric: affect more appropriate. Generally much more dynamic and interactive.   Assessment & Plan:   1. 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 Myoclonus.  3. Seizure disorder  4. Chronic renal failure  5. Hypertension. Substantial elevation today  6. Anemia.  7. Thrombocytopenia - improved.  8. Mood -much improved    Plan:  1. Continue with SBQC in house and walker out of the home. Encouraged water walking as well if she has access. Some of this is a confidence issue in my opinion.  2. Maintain klonopin to 0.5mg  at bid for myoclonus. Continue to take after HD and before bed on HD days  3. Continue with keppra for seizure prophylaxis  4. Renal follow up per [facility] with HD ongoing  5. Follow up with me in about 6 months. 30 minutes of face to face patient care time were spent during this visit. All questions were encouraged and answered

## 2015-03-06 ENCOUNTER — Other Ambulatory Visit: Payer: Self-pay | Admitting: Obstetrics

## 2015-03-07 LAB — CYTOLOGY - PAP

## 2015-04-30 ENCOUNTER — Ambulatory Visit: Admit: 2015-04-30 | Payer: Medicaid Other | Admitting: Obstetrics

## 2015-04-30 SURGERY — COLPOSCOPY
Anesthesia: Choice

## 2015-08-08 ENCOUNTER — Other Ambulatory Visit: Payer: Self-pay | Admitting: Physical Medicine & Rehabilitation

## 2015-08-14 ENCOUNTER — Encounter: Payer: Medicaid Other | Admitting: Physical Medicine & Rehabilitation

## 2015-08-21 ENCOUNTER — Encounter: Payer: Self-pay | Admitting: Physical Medicine & Rehabilitation

## 2015-08-21 ENCOUNTER — Encounter: Payer: Medicaid Other | Attending: Physical Medicine & Rehabilitation | Admitting: Physical Medicine & Rehabilitation

## 2015-08-21 VITALS — BP 92/49 | HR 65 | Resp 12

## 2015-08-21 DIAGNOSIS — D649 Anemia, unspecified: Secondary | ICD-10-CM | POA: Diagnosis not present

## 2015-08-21 DIAGNOSIS — Z8701 Personal history of pneumonia (recurrent): Secondary | ICD-10-CM | POA: Insufficient documentation

## 2015-08-21 DIAGNOSIS — G931 Anoxic brain damage, not elsewhere classified: Secondary | ICD-10-CM | POA: Diagnosis not present

## 2015-08-21 DIAGNOSIS — G253 Myoclonus: Secondary | ICD-10-CM | POA: Diagnosis not present

## 2015-08-21 DIAGNOSIS — Z79899 Other long term (current) drug therapy: Secondary | ICD-10-CM | POA: Diagnosis not present

## 2015-08-21 DIAGNOSIS — F329 Major depressive disorder, single episode, unspecified: Secondary | ICD-10-CM | POA: Insufficient documentation

## 2015-08-21 DIAGNOSIS — K529 Noninfective gastroenteritis and colitis, unspecified: Secondary | ICD-10-CM | POA: Insufficient documentation

## 2015-08-21 DIAGNOSIS — I509 Heart failure, unspecified: Secondary | ICD-10-CM | POA: Insufficient documentation

## 2015-08-21 DIAGNOSIS — G40909 Epilepsy, unspecified, not intractable, without status epilepticus: Secondary | ICD-10-CM | POA: Diagnosis not present

## 2015-08-21 DIAGNOSIS — Z8674 Personal history of sudden cardiac arrest: Secondary | ICD-10-CM | POA: Insufficient documentation

## 2015-08-21 DIAGNOSIS — R269 Unspecified abnormalities of gait and mobility: Secondary | ICD-10-CM | POA: Insufficient documentation

## 2015-08-21 DIAGNOSIS — I429 Cardiomyopathy, unspecified: Secondary | ICD-10-CM | POA: Insufficient documentation

## 2015-08-21 DIAGNOSIS — D696 Thrombocytopenia, unspecified: Secondary | ICD-10-CM | POA: Insufficient documentation

## 2015-08-21 DIAGNOSIS — I129 Hypertensive chronic kidney disease with stage 1 through stage 4 chronic kidney disease, or unspecified chronic kidney disease: Secondary | ICD-10-CM | POA: Insufficient documentation

## 2015-08-21 MED ORDER — CLONAZEPAM 0.5 MG PO TABS: 0.5000 mg | ORAL_TABLET | Freq: Two times a day (BID) | ORAL | Status: DC

## 2015-08-21 NOTE — Patient Instructions (Signed)
  PLEASE CALL ME WITH ANY PROBLEMS OR QUESTIONS (#[phone]).      

## 2015-08-21 NOTE — Progress Notes (Signed)
Subjective:    Patient ID: Heather Sims, female    DOB: [birth year], 25 y.o.   MRN: [id number]  HPI   Heather Sims is back regarding regarding her myoclonus and gait disorder. She hasn't had any major medical changes since I last saw her. She was placed on additional bp medication (unsure of the medication---not listed in our [facility]).   She continues with her rolling walker. She only uses her cane in the house. She hasn't noticed any improvement since last fall.   No seizures reported   Pain Inventory Average Pain 0 Pain Right Now 0 My pain is No pain  In the last 24 hours, has pain interfered with the following? General activity 0 Relation with others 0 Enjoyment of life 0 What TIME of day is your pain at its worst? no pain Sleep (in general) no pain  Pain is worse with: no pain Pain improves with: no pain Relief from Meds: 0  Mobility use a cane use a walker ability to climb steps?  yes do you drive?  no Do you have any goals in this area?  yes  Function disabled: date disabled .  Neuro/Psych tremor  Prior Studies Any changes since last visit?  no  Physicians involved in your care Any changes since last visit?  no   Family History  Problem Relation Age of Onset  . Hypertension Mother   . Diabetes Father   . Hyperlipidemia Father    Social History   Social History  . Marital Status: Single    Spouse Name: N/A  . Number of Children: 0  . Years of Education: college jr   Social History Main Topics  . Smoking status: Never Smoker   . Smokeless tobacco: Never Used  . Alcohol Use: No  . Drug Use: No  . Sexual Activity: No   Other Topics Concern  . None   Social History Narrative   ** Merged History Encounter **       Past Surgical History  Procedure Laterality Date  . Renal biopsy    . Av fistula placement  12/18/2011    Procedure: ARTERIOVENOUS (AV) FISTULA CREATION;  Surgeon: Angelia Mould, MD;  Location: [facility] OR;  Service: Vascular;   Laterality: Left;  . Revison of arteriovenous fistula Left [id number]    Procedure: PLICATION OF ARTERIOVENOUS FISTULA;  Surgeon: Elam Dutch, MD;  Location: [place];  Service: Vascular;  Laterality: Left;  . Insertion of dialysis catheter Right 08/06/2014    Procedure: INSERTION OF DIALYSIS CATHETER;  Surgeon: Elam Dutch, MD;  Location: [place];  Service: Vascular;  Laterality: Right;   Past Medical History  Diagnosis Date  . Pneumonia     'walking pneumonia'  . Seizures ([place])   . Anemia   . Cardiomyopathy   . Heart failure   . CHF (congestive heart failure) ([place])   . Ileitis   . Hypertension   . Brain injury ([place])   . Gait disorder 01/11/2013  . Family history of adverse reaction to anesthesia     Mother hard to wake up.  . Cardiac arrest ([place]) 03/26/2011  . Depression   . Chronic kidney disease     TThS- [place]  . Renal disorder   . MPGN (membranoproliferative glomerulonephritis), type 2   . History of blood transfusion 11/2011   BP 92/49 mmHg  Pulse 65  Resp 12  SpO2 99%  Opioid Risk Score:   Fall Risk Score:  `1  Depression  screen PHQ 2/9  Depression screen PHQ 2/9 07/02/2014  Decreased Interest 1  Down, Depressed, Hopeless 1  PHQ - 2 Score 2  Altered sleeping 1  Tired, decreased energy 1  Change in appetite 0  Feeling bad or failure about yourself  2  Trouble concentrating 0  Moving slowly or fidgety/restless 0  Suicidal thoughts 0  PHQ-9 Score 6     Review of Systems     Objective:   Physical Exam Constitutional: She is oriented to person, place, and time. She appears well-developed and well-nourished.  HENT:  Head: Normocephalic and atraumatic.  Eyes: Conjunctivae and EOM are normal. Pupils are equal, round, and reactive to light.  Neck: Normal range of motion.  Cardiovascular: Normal rate and regular rhythm.  Pulmonary/Chest: Effort normal and breath sounds normal.  Neurological: She is alert and oriented to person, place, and time. No  cranial nerve deficit.    Strength in the legs is grossly 55 in UE's. She is 5/5 LE'S now. Gait pattern has improved. With the walker she takes normal length steps and is very fluid. She did not walk with her cane today.  Psychiatric: affect more appropriate. Generally much more dynamic and interactive.   Assessment & Plan:   1. 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 Myoclonus.  3. Seizure disorder  4. Chronic renal failure  5. Hypertension. Substantial elevation today  6. Anemia.  7. Thrombocytopenia - improved.  8. Mood -much improved    Plan:  1. Continue with SBQC in house and walker out of the home. Encouraged water walking as well as her pool is now open. .  2. Maintain klonopin to 0.5mg  at bid for myoclonus. Continue to take after HD and before bed on HD days  3. Continue with keppra for seizure prophylaxis  4. She will follow up with renal MD regarding low BP  5. Follow up with me in about 12 months. 15 minutes of face to face patient care time were spent during this visit. All questions were encouraged and answered

## 2015-09-26 ENCOUNTER — Other Ambulatory Visit: Payer: Self-pay | Admitting: Physical Medicine & Rehabilitation

## 2015-10-22 ENCOUNTER — Telehealth: Payer: Self-pay

## 2015-10-22 NOTE — Telephone Encounter (Signed)
Pt called in regards to her Klonopin. The most recent rx was 0.5mg  once daily. In the last OV note on 08/21/15, it states 0.5mg  BID. Please advise on proper sig.

## 2015-10-23 MED ORDER — CLONAZEPAM 0.5 MG PO TABS: 0.5000 mg | ORAL_TABLET | Freq: Two times a day (BID) | ORAL | 3 refills | Status: DC

## 2015-10-23 NOTE — Telephone Encounter (Signed)
Called in Klonopin rx. Made pt aware.

## 2015-10-23 NOTE — Telephone Encounter (Signed)
I think it's supposed to be BID. May have changed by default on system and I didn't realize it. RX was for #60.  I have made change but it will need to be called in to pharmacy

## 2015-12-24 ENCOUNTER — Other Ambulatory Visit (HOSPITAL_COMMUNITY): Payer: Self-pay | Admitting: Nephrology

## 2015-12-24 DIAGNOSIS — D4100 Neoplasm of uncertain behavior of unspecified kidney: Secondary | ICD-10-CM

## 2015-12-27 ENCOUNTER — Ambulatory Visit (HOSPITAL_COMMUNITY): Payer: Medicaid Other

## 2015-12-30 ENCOUNTER — Ambulatory Visit (HOSPITAL_COMMUNITY): Payer: Medicaid Other

## 2016-01-03 ENCOUNTER — Ambulatory Visit (HOSPITAL_COMMUNITY): Payer: Medicaid Other

## 2016-01-27 ENCOUNTER — Ambulatory Visit (INDEPENDENT_AMBULATORY_CARE_PROVIDER_SITE_OTHER): Payer: Medicaid Other | Admitting: Adult Health

## 2016-01-27 ENCOUNTER — Encounter: Payer: Self-pay | Admitting: Adult Health

## 2016-01-27 VITALS — BP 95/62 | HR 83 | Resp 14 | Ht 62.0 in | Wt 118.0 lb

## 2016-01-27 DIAGNOSIS — R569 Unspecified convulsions: Secondary | ICD-10-CM

## 2016-01-27 MED ORDER — LEVETIRACETAM 250 MG PO TABS: 250.0000 mg | ORAL_TABLET | Freq: Two times a day (BID) | ORAL | 3 refills | Status: DC

## 2016-01-27 NOTE — Progress Notes (Signed)
I have read the note, and I agree with the clinical assessment and plan.  Heather Sims   

## 2016-01-27 NOTE — Progress Notes (Signed)
PATIENT: Heather Sims DOB: December 31, 1990  REASON FOR VISIT: follow up- seizures HISTORY FROM: patient  HISTORY OF PRESENT ILLNESS: Heather Sims is a 25 year old female with a history of anoxic brain injury with gait instability and seizures. She returns today for follow-up. She is currently on Keppra 250 mg twice a day. She denies any seizure events. She uses a walker when ambulating. She is able to complete all ADLs independently. She does operate a motor vehicle but only with a family member present. Denies any significant changes with her gait or balance. No change in mood or behavior. She continues on dialysis. She returns today for an evaluation.  HISTORY 01/28/15: Heather Sims is a 25 year old female with a history of a prior cardiac arrest and anoxic brain injury resulting in gait instability and seizures. She returns today for follow-up. She is currently taking Keppra 250 mg twice a day. She is tolerating this well. Denies any seizure events. She is able to complete all ADLs independently. She will occasionally drive a car if she has a family member to ride with her otherwise she does not drive. Patient continues to use a walker when ambulating. She states that she will use a cane but only when she is at home. Denies any recent falls. She continues with hemodialysis. She is currently trying to fulfill the requirements to be on the kidney transplant list. She denies any new medical issues. She returns today for an evaluation.  HISTORY 01/24/14 ([place]): Heather Sims is a 25 year old right-handed black female with a history of a prior cardiac arrest, and anoxia of the brain with subsequent issues with gait instability, and seizures. The patient has done quite well since last seen one year ago on very low-dose Keppra. The patient takes 250 mg twice daily. The patient is on hemodialysis. The patient currently does not drive a car, but she is starting to learn how to do this. The patient denies any  other significant medical issues that have come up since last seen. The patient is followed by Dr. Naaman Plummer from rehabilitation. She comes in today for routine reevaluation.   REVIEW OF SYSTEMS: Out of a complete 14 system review of symptoms, the patient complains only of the following symptoms, and all other reviewed systems are negative.  ALLERGIES: Allergies  Allergen Reactions  . Pollen Extract Swelling    HOME MEDICATIONS: Outpatient Medications Prior to Visit  Medication Sig Dispense Refill  . acetaminophen (TYLENOL) 325 MG tablet Take 2 tablets (650 mg total) by mouth every 4 (four) hours as needed (pain).    . carvedilol (COREG) 25 MG tablet Take 37.5 mg by mouth 2 (two) times daily with a meal.    . Cinacalcet HCl (SENSIPAR PO) Take 60 mg by mouth daily.     . clonazePAM (KLONOPIN) 0.5 MG tablet Take 1 tablet (0.5 mg total) by mouth 2 (two) times daily. 60 tablet 3  . levETIRAcetam (KEPPRA) 250 MG tablet TAKE ONE TABLET BY MOUTH TWICE DAILY 60 tablet 12  . multivitamin (RENA-VIT) TABS tablet Take 1 tablet by mouth at bedtime. 30 tablet 1  . sevelamer carbonate (RENVELA) 800 MG tablet Take 800 mg by mouth 3 (three) times daily with meals.     No facility-administered medications prior to visit.     PAST MEDICAL HISTORY: Past Medical History:  Diagnosis Date  . Anemia   . Brain injury ([place])   . Cardiac arrest ([place]) 03/26/2011  . Cardiomyopathy   . CHF (congestive heart failure) (  [place])   . Chronic kidney disease    TThS- [place]  . Depression   . Family history of adverse reaction to anesthesia    Mother hard to wake up.  . Gait disorder 01/11/2013  . Heart failure   . History of blood transfusion 11/2011  . Hypertension   . Ileitis   . MPGN (membranoproliferative glomerulonephritis), type 2   . Pneumonia    'walking pneumonia'  . Renal disorder   . Seizures ([place])     PAST SURGICAL HISTORY: Past Surgical History:  Procedure Laterality Date  . AV FISTULA  PLACEMENT  12/18/2011   Procedure: ARTERIOVENOUS (AV) FISTULA CREATION;  Surgeon: Angelia Mould, MD;  Location: [place];  Service: Vascular;  Laterality: Left;  . INSERTION OF DIALYSIS CATHETER Right 08/06/2014   Procedure: INSERTION OF DIALYSIS CATHETER;  Surgeon: Elam Dutch, MD;  Location: [place];  Service: Vascular;  Laterality: Right;  . RENAL BIOPSY    . REVISON OF ARTERIOVENOUS FISTULA Left [id number]   Procedure: PLICATION OF ARTERIOVENOUS FISTULA;  Surgeon: Elam Dutch, MD;  Location: [facility] OR;  Service: Vascular;  Laterality: Left;    FAMILY HISTORY: Family History  Problem Relation Age of Onset  . Hypertension Mother   . Diabetes Father   . Hyperlipidemia Father     SOCIAL HISTORY: Social History   Social History  . Marital status: Single    Spouse name: N/A  . Number of children: 0  . Years of education: college jr   Occupational History  . Not on file.   Social History Main Topics  . Smoking status: Never Smoker  . Smokeless tobacco: Never Used  . Alcohol use No  . Drug use: No  . Sexual activity: No   Other Topics Concern  . Not on file   Social History Narrative   ** Merged History Encounter **          PHYSICAL EXAM  Vitals:   01/27/16 1443  BP: 95/62  Pulse: 83  Resp: 14  Weight: 118 lb (53.5 kg)  Height: 5\' 2"  (1.575 m)   Body mass index is 21.58 kg/m.  Generalized: Well developed, in no acute distress   Neurological examination  Mentation: Alert oriented to time, place, history taking. Follows all commands speech and language fluent Cranial nerve II-XII: Pupils were equal round reactive to light. Extraocular movements were full, visual field were full on confrontational test. Facial sensation and strength were normal. Uvula tongue midline. Head turning and shoulder shrug  were normal and symmetric. Motor: The motor testing reveals 5 over 5 strength of all 4 extremities. Good symmetric motor tone is noted throughout.    Sensory: Sensory testing is intact to soft touch on all 4 extremities. No evidence of extinction is noted.  Coordination: Cerebellar testing reveals good finger-nose-finger and heel-to-shin bilaterally.  Gait and station: Gait is normal. Tandem gait is normal. Romberg is negative. No drift is seen.  Reflexes: Deep tendon reflexes are symmetric and normal bilaterally.   DIAGNOSTIC DATA (LABS, IMAGING, TESTING) - I reviewed patient records, labs, notes, testing and imaging myself where available.  Lab Results  Component Value Date   WBC 6.6 12/18/2011   HGB 10.5 (L) 08/06/2014   HCT 31.0 (L) 08/06/2014   MCV 90.9 12/18/2011   PLT 244 12/18/2011      Component Value Date/Time   NA 138 08/06/2014 0652   K 4.7 08/06/2014 0652   CL 99 12/18/2011 0505   CO2  27 12/18/2011 0505   GLUCOSE 83 08/06/2014 0652   BUN 17 12/18/2011 0505   CREATININE 2.30 (H) 12/18/2011 0505   CALCIUM 9.4 12/18/2011 0505   CALCIUM 8.4 10/28/2011 1327   PROT 6.1 11/29/2011 0224   ALBUMIN 2.4 (L) 12/18/2011 0505   AST 56 (H) 11/29/2011 0224   ALT 6 12/01/2011 2330   ALKPHOS 62 11/29/2011 0224   BILITOT 0.4 11/29/2011 0224   GFRNONAA 29 (L) 12/18/2011 0505   GFRAA 34 (L) 12/18/2011 0505   Lab Results  Component Value Date   CHOL 296 (H) 03/26/2011   HDL 54 03/26/2011   LDLCALC 213 (H) 03/26/2011   TRIG 146 03/26/2011   CHOLHDL 5.5 03/26/2011   No results found for: HGBA1C Lab Results  Component Value Date   VITAMINB12 1,354 (H) 11/28/2011   Lab Results  Component Value Date   TSH 7.226 (H) 09/12/2011      ASSESSMENT AND PLAN 25 y.o. year old female  has a past medical history of Anemia; Brain injury ([place]); Cardiac arrest ([place]) (03/26/2011); Cardiomyopathy; CHF (congestive heart failure) ([place]); Chronic kidney disease; Depression; Family history of adverse reaction to anesthesia; Gait disorder (01/11/2013); Heart failure; History of blood transfusion (11/2011); Hypertension; Ileitis; MPGN  (membranoproliferative glomerulonephritis), type 2; Pneumonia; Renal disorder; and Seizures ([place]). here with:  1. Seizures  Overall the patient has remained stable. She will continue on Keppra 250 mg twice a day. Patient advised that she has any seizure event she should let us know. She will follow-up in one year with Dr. Jannifer Franklin.     Ward Givens, MSN, NP-C 01/27/2016, 2:45 PM [facility] [address] [place], [place] [address] [phone]

## 2016-01-27 NOTE — Patient Instructions (Signed)
Continue Keppra If you have any seizure events please let us know.   

## 2016-03-27 ENCOUNTER — Telehealth: Payer: Self-pay | Admitting: Physical Medicine & Rehabilitation

## 2016-03-27 ENCOUNTER — Telehealth: Payer: Self-pay

## 2016-03-27 MED ORDER — CLONAZEPAM 0.5 MG PO TABS: 0.5000 mg | ORAL_TABLET | Freq: Two times a day (BID) | ORAL | 3 refills | Status: DC

## 2016-03-27 NOTE — Telephone Encounter (Deleted)
Mom called asking was medication Clonazepam to be taking once daily or BID. Per Dr. Naaman Plummer last note  It says 0.5mg  by mouth BID

## 2016-03-27 NOTE — Telephone Encounter (Signed)
I spoke with Heather Sims's mother.  There was an OLD issue with klonopin order being incorrect but that should have been addressed in August 2017 by [name] CMA and Dr Naaman Plummer.  I have called in new refill correct sig 0.5 mg bid #60 3 RF

## 2016-03-27 NOTE — Telephone Encounter (Signed)
ERROR

## 2016-03-27 NOTE — Telephone Encounter (Signed)
Ptn called office and states Heather Sims did RX wrong she gets Lorazepam 2x a day - he filled 1 time a day - call back please.

## 2016-08-19 ENCOUNTER — Encounter: Payer: Medicaid Other | Admitting: Physical Medicine & Rehabilitation

## 2016-09-17 ENCOUNTER — Ambulatory Visit
Admission: RE | Admit: 2016-09-17 | Discharge: 2016-09-17 | Disposition: A | Discharge status: Home / Self Care | Payer: MEDICAID | Attending: Nephrology | Admitting: Nephrology

## 2016-09-28 DIAGNOSIS — Z7682 Awaiting organ transplant status: Principal | ICD-10-CM

## 2016-09-28 DIAGNOSIS — N186 End stage renal disease: Secondary | ICD-10-CM

## 2016-09-28 DIAGNOSIS — Z01818 Encounter for other preprocedural examination: Secondary | ICD-10-CM

## 2016-09-30 ENCOUNTER — Encounter: Payer: Medicaid Other | Attending: Physical Medicine & Rehabilitation | Admitting: Physical Medicine & Rehabilitation

## 2016-09-30 ENCOUNTER — Encounter: Payer: Self-pay | Admitting: Physical Medicine & Rehabilitation

## 2016-09-30 VITALS — BP 99/61 | HR 98

## 2016-09-30 DIAGNOSIS — N189 Chronic kidney disease, unspecified: Secondary | ICD-10-CM | POA: Insufficient documentation

## 2016-09-30 DIAGNOSIS — D696 Thrombocytopenia, unspecified: Secondary | ICD-10-CM | POA: Insufficient documentation

## 2016-09-30 DIAGNOSIS — I130 Hypertensive heart and chronic kidney disease with heart failure and stage 1 through stage 4 chronic kidney disease, or unspecified chronic kidney disease: Secondary | ICD-10-CM | POA: Diagnosis not present

## 2016-09-30 DIAGNOSIS — D649 Anemia, unspecified: Secondary | ICD-10-CM | POA: Insufficient documentation

## 2016-09-30 DIAGNOSIS — I469 Cardiac arrest, cause unspecified: Secondary | ICD-10-CM | POA: Insufficient documentation

## 2016-09-30 DIAGNOSIS — I429 Cardiomyopathy, unspecified: Secondary | ICD-10-CM | POA: Insufficient documentation

## 2016-09-30 DIAGNOSIS — G253 Myoclonus: Secondary | ICD-10-CM | POA: Insufficient documentation

## 2016-09-30 DIAGNOSIS — G931 Anoxic brain damage, not elsewhere classified: Secondary | ICD-10-CM | POA: Insufficient documentation

## 2016-09-30 DIAGNOSIS — F329 Major depressive disorder, single episode, unspecified: Secondary | ICD-10-CM | POA: Diagnosis not present

## 2016-09-30 MED ORDER — CLONAZEPAM 0.5 MG PO TABS: 0.5000 mg | ORAL_TABLET | Freq: Two times a day (BID) | ORAL | 4 refills | Status: DC

## 2016-09-30 NOTE — Patient Instructions (Signed)
?   WALKER WITH BRAKES???

## 2016-09-30 NOTE — Progress Notes (Signed)
Subjective:    Patient ID: Heather Sims, female    DOB: 11-18-1990, 26 y.o.   MRN: [id number]  HPI Nitza is here in follow up of her anoxic BI. She states things are about the same. She uses her cane sometimes but more often uses her walker almost all of the time. She has had a few falls when changing surfaces at home. She attributes it to the walker getting away from her when she is in the fridge or at the sink.   She denies any other issues. She is taking her klonopin as recommended. She is going to the pool for exercise and as awhole feels stronger.       Pain Inventory Average Pain 0 Pain Right Now 0 My pain is NA  In the last 24 hours, has pain interfered with the following? General activity 0 Relation with others 0 Enjoyment of life 0 What TIME of day is your pain at its worst? . Sleep (in general) Fair  Pain is worse with: NA Pain improves with: NA Relief from Meds: NA  Mobility use a walker ability to climb steps?  yes do you drive?  no  Function not employed: date last employed .  Neuro/Psych tremor  Prior Studies Any changes since last visit?  no  Physicians involved in your care Any changes since last visit?  no   Family History  Problem Relation Age of Onset  . Hypertension Mother   . Diabetes Father   . Hyperlipidemia Father    Social History   Social History  . Marital status: Single    Spouse name: N/A  . Number of children: 0  . Years of education: college jr   Social History Main Topics  . Smoking status: Never Smoker  . Smokeless tobacco: Never Used  . Alcohol use No  . Drug use: No  . Sexual activity: No   Other Topics Concern  . Not on file   Social History Narrative   ** Merged History Encounter **       Past Surgical History:  Procedure Laterality Date  . AV FISTULA PLACEMENT  12/18/2011   Procedure: ARTERIOVENOUS (AV) FISTULA CREATION;  Surgeon: Angelia Mould, MD;  Location: [facility];  Service: Vascular;   Laterality: Left;  . INSERTION OF DIALYSIS CATHETER Right 08/06/2014   Procedure: INSERTION OF DIALYSIS CATHETER;  Surgeon: Elam Dutch, MD;  Location: [facility];  Service: Vascular;  Laterality: Right;  . RENAL BIOPSY    . REVISON OF ARTERIOVENOUS FISTULA Left 7/51/7001   Procedure: PLICATION OF ARTERIOVENOUS FISTULA;  Surgeon: Elam Dutch, MD;  Location: [facility];  Service: Vascular;  Laterality: Left;   Past Medical History:  Diagnosis Date  . Anemia   . Brain injury ([place])   . Cardiac arrest ([place]) 03/26/2011  . Cardiomyopathy   . CHF (congestive heart failure) ([place])   . Chronic kidney disease    [facility]- [place]  . Depression   . Family history of adverse reaction to anesthesia    Mother hard to wake up.  . Gait disorder 01/11/2013  . Heart failure   . History of blood transfusion 11/2011  . Hypertension   . Ileitis   . MPGN (membranoproliferative glomerulonephritis), type 2   . Pneumonia    'walking pneumonia'  . Renal disorder   . Seizures ([facility])    BP 99/61   Pulse 98   SpO2 98%   Opioid Risk Score:   Fall Risk  Score:  `1  Depression screen PHQ 2/9  Depression screen PHQ 2/9 07/02/2014  Decreased Interest 1  Down, Depressed, Hopeless 1  PHQ - 2 Score 2  Altered sleeping 1  Tired, decreased energy 1  Change in appetite 0  Feeling bad or failure about yourself  2  Trouble concentrating 0  Moving slowly or fidgety/restless 0  Suicidal thoughts 0  PHQ-9 Score 6     Review of Systems  Constitutional: Negative.   HENT: Negative.   Eyes: Negative.   Respiratory: Negative.   Cardiovascular: Negative.   Gastrointestinal: Negative.   Endocrine: Negative.   Genitourinary: Negative.   Musculoskeletal: Negative.   Skin: Negative.   Allergic/Immunologic: Negative.   Neurological: Negative.   Hematological: Negative.   Psychiatric/Behavioral: Negative.   All other systems reviewed and are negative.      Objective:   Physical Exam  Constitutional: She is  oriented to person, place, and time. She appears well-developed and well-nourished.  HENT:  Head: Normocephalic and atraumatic.  Eyes: Conjunctivae and EOM are normal. Pupils are equal, round, and reactive to light.  Neck: Normal range of motion.  Cardiovascular: RRR.  Pulmonary/Chest: normal effort.  Neurological: She is alert and oriented to person, place, and time. No cranial nerve deficit.    Strength in the legs is grossly 55 in UE's. She is 5/5 LE'S now. Gait pattern has improved. With the walker she takes normal length steps and is very fluid. A BIT IMPULSIVE. Minimal tremors or myoclonus today  Psychiatric: affect is extremely flat. Doesn't offer much.    Assessment & Plan:   1. 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 Myoclonus.  3. Seizure disorder  4. Chronic renal failure  5. Hypertension. Substantial elevation today  6. Anemia.  7. Thrombocytopenia - improved.  8. Mood -much improved    Plan:  1. Made a referral to neuro-rehab for gait/stability/options for stability at home. ?brakes for walker. .  2. Maintain klonopin to 0.5mg  at bid for myoclonus. Continue to take after HD and before bed on HD days  3. Continue with keppra for seizure prophylaxis  4. Discussed importance of safety and taking her time.  5. Follow up with me in about 12 months. 15 minutes of face to face patient care time were spent during this visit. All questions were encouraged and answered

## 2016-10-15 ENCOUNTER — Ambulatory Visit
Admission: RE | Admit: 2016-10-15 | Discharge: 2016-10-15 | Disposition: A | Discharge status: Home / Self Care | Payer: MEDICAID | Attending: Nephrology | Admitting: Nephrology

## 2016-10-20 DIAGNOSIS — Z7682 Awaiting organ transplant status: Principal | ICD-10-CM

## 2016-10-20 DIAGNOSIS — Z01818 Encounter for other preprocedural examination: Secondary | ICD-10-CM

## 2016-10-20 DIAGNOSIS — N186 End stage renal disease: Secondary | ICD-10-CM

## 2016-11-12 ENCOUNTER — Ambulatory Visit
Admission: RE | Admit: 2016-11-12 | Discharge: 2016-11-12 | Payer: MEDICAID | Attending: Nephrology | Admitting: Nephrology

## 2016-11-17 DIAGNOSIS — Z01818 Encounter for other preprocedural examination: Principal | ICD-10-CM

## 2016-11-17 DIAGNOSIS — Z7682 Awaiting organ transplant status: Secondary | ICD-10-CM

## 2016-11-17 DIAGNOSIS — N186 End stage renal disease: Secondary | ICD-10-CM

## 2016-12-17 ENCOUNTER — Ambulatory Visit
Admission: RE | Admit: 2016-12-17 | Discharge: 2016-12-17 | Payer: MEDICAID | Attending: Nephrology | Admitting: Nephrology

## 2016-12-22 DIAGNOSIS — Z7682 Awaiting organ transplant status: Principal | ICD-10-CM

## 2016-12-22 DIAGNOSIS — Z01818 Encounter for other preprocedural examination: Secondary | ICD-10-CM

## 2016-12-22 DIAGNOSIS — N186 End stage renal disease: Secondary | ICD-10-CM

## 2017-01-14 ENCOUNTER — Ambulatory Visit
Admission: RE | Admit: 2017-01-14 | Discharge: 2017-01-14 | Payer: MEDICAID | Attending: Nephrology | Admitting: Nephrology

## 2017-01-18 ENCOUNTER — Ambulatory Visit
Admission: RE | Admit: 2017-01-18 | Discharge: 2017-01-18 | Disposition: A | Discharge status: Home / Self Care | Payer: MEDICAID

## 2017-01-18 ENCOUNTER — Ambulatory Visit: Admission: RE | Admit: 2017-01-18 | Discharge: 2017-01-18 | Disposition: A | Discharge status: Home / Self Care

## 2017-01-18 ENCOUNTER — Ambulatory Visit
Admission: RE | Admit: 2017-01-18 | Discharge: 2017-01-18 | Disposition: A | Discharge status: Home / Self Care | Admitting: Family

## 2017-01-18 DIAGNOSIS — N186 End stage renal disease: Principal | ICD-10-CM

## 2017-01-18 DIAGNOSIS — N009 Acute nephritic syndrome with unspecified morphologic changes: Secondary | ICD-10-CM

## 2017-01-18 DIAGNOSIS — N289 Disorder of kidney and ureter, unspecified: Secondary | ICD-10-CM

## 2017-01-18 DIAGNOSIS — Z01810 Encounter for preprocedural cardiovascular examination: Secondary | ICD-10-CM

## 2017-01-18 DIAGNOSIS — R269 Unspecified abnormalities of gait and mobility: Secondary | ICD-10-CM

## 2017-01-18 DIAGNOSIS — G40909 Epilepsy, unspecified, not intractable, without status epilepticus: Secondary | ICD-10-CM

## 2017-01-18 DIAGNOSIS — I428 Other cardiomyopathies: Secondary | ICD-10-CM

## 2017-01-18 DIAGNOSIS — Z01818 Encounter for other preprocedural examination: Principal | ICD-10-CM

## 2017-01-18 DIAGNOSIS — Z7682 Awaiting organ transplant status: Principal | ICD-10-CM

## 2017-01-18 DIAGNOSIS — G40309 Generalized idiopathic epilepsy and epileptic syndromes, not intractable, without status epilepticus: Secondary | ICD-10-CM

## 2017-01-18 DIAGNOSIS — G253 Myoclonus: Secondary | ICD-10-CM

## 2017-01-18 DIAGNOSIS — Z992 Dependence on renal dialysis: Secondary | ICD-10-CM

## 2017-01-18 DIAGNOSIS — I469 Cardiac arrest, cause unspecified: Secondary | ICD-10-CM

## 2017-01-18 DIAGNOSIS — G931 Anoxic brain damage, not elsewhere classified: Secondary | ICD-10-CM

## 2017-01-19 DIAGNOSIS — Z01818 Encounter for other preprocedural examination: Secondary | ICD-10-CM

## 2017-01-19 DIAGNOSIS — N186 End stage renal disease: Secondary | ICD-10-CM

## 2017-01-19 DIAGNOSIS — Z7682 Awaiting organ transplant status: Principal | ICD-10-CM

## 2017-01-27 ENCOUNTER — Ambulatory Visit: Payer: Medicaid Other | Admitting: Neurology

## 2017-01-27 ENCOUNTER — Telehealth: Payer: Self-pay | Admitting: Neurology

## 2017-01-27 NOTE — Telephone Encounter (Signed)
This patient did not show for a revisit appointment today. 

## 2017-01-28 ENCOUNTER — Encounter: Payer: Self-pay | Admitting: Neurology

## 2017-02-17 ENCOUNTER — Other Ambulatory Visit: Payer: Self-pay | Admitting: Adult Health

## 2017-02-17 NOTE — Telephone Encounter (Signed)
Keppra refilled x 3 months; note to pharmacy- pt must call and schedule FU.

## 2017-03-09 ENCOUNTER — Ambulatory Visit
Admission: RE | Admit: 2017-03-09 | Discharge: 2017-03-09 | Payer: MEDICAID | Attending: Nephrology | Admitting: Nephrology

## 2017-03-10 ENCOUNTER — Other Ambulatory Visit: Payer: Self-pay | Admitting: *Deleted

## 2017-03-10 ENCOUNTER — Ambulatory Visit (INDEPENDENT_AMBULATORY_CARE_PROVIDER_SITE_OTHER): Payer: Medicaid Other | Admitting: Vascular Surgery

## 2017-03-10 ENCOUNTER — Other Ambulatory Visit: Payer: Self-pay

## 2017-03-10 ENCOUNTER — Encounter: Payer: Self-pay | Admitting: *Deleted

## 2017-03-10 ENCOUNTER — Encounter: Payer: Self-pay | Admitting: Vascular Surgery

## 2017-03-10 VITALS — BP 103/75 | HR 85 | Temp 97.9°F | Resp 14 | Ht 62.0 in | Wt 115.0 lb

## 2017-03-10 DIAGNOSIS — Z992 Dependence on renal dialysis: Secondary | ICD-10-CM

## 2017-03-10 DIAGNOSIS — N186 End stage renal disease: Secondary | ICD-10-CM | POA: Diagnosis not present

## 2017-03-10 NOTE — Progress Notes (Signed)
   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  The consult is requested by Dr. Vanetta Mulders.  HPI:   [name] is a pleasant 26 y.o. female who had a left basilic vein transposition on 12/18/2011.  She has undergone previous plication of this in May 2016.  She developed a small wound over the central portion of her fistula which is aneurysmal about 2 weeks ago and was set up for assessment here.  They have been trying to avoid this area at dialysis.  She denies any pain or paresthesias in her left arm.  Current Outpatient Medications  Medication Sig Dispense Refill  . acetaminophen (TYLENOL) 325 MG tablet Take 2 tablets (650 mg total) by mouth every 4 (four) hours as needed (pain).    . Cinacalcet HCl (SENSIPAR PO) Take 60 mg by mouth daily.     . clonazePAM (KLONOPIN) 0.5 MG tablet Take 1 tablet (0.5 mg total) by mouth 2 (two) times daily. 60 tablet 4  . levETIRAcetam (KEPPRA) 250 MG tablet TAKE ONE TABLET BY MOUTH TWICE DAILY 180 tablet 0  . sevelamer carbonate (RENVELA) 800 MG tablet Take 800 mg by mouth 3 (three) times daily with meals.     No current facility-administered medications for this visit.     REVIEW OF SYSTEMS:  [X]  denotes positive finding, [ ]  denotes negative finding Cardiac  Comments:  Chest pain or chest pressure:    Shortness of breath upon exertion:    Short of breath when lying flat:    Irregular heart rhythm:    Constitutional    Fever or chills:     PHYSICAL EXAM:   Vitals:   03/10/17 1202  BP: 103/75  Pulse: 85  Resp: 14  Temp: 97.9 F (36.6 C)  TempSrc: Oral  SpO2: 98%  Weight: 115 lb (52.2 kg)  Height: 5\' 2"  (1.575 m)    GENERAL: The patient is a well-nourished female, in no acute distress. The vital signs are documented above. CARDIOVASCULAR: There is a regular rate and rhythm. PULMONARY: There is good air exchange  bilaterally without wheezing or rales. His fistula has an excellent thrill.  There are 2 large aneurysmal areas.  The more central portion has an overlying ulcer. She has a palpable left radial pulse.  DATA:   No new data.  MEDICAL ISSUES:   ULCER OVERLYING ANEURYSMAL LEFT UPPER ARM FISTULA: Given the risk of bleeding, I have recommended plication of the aneurysm on Friday.  We have discussed the procedure and potential complications and she is agreeable to proceed.  She understands if she has any bleeding from this area to hold direct pressure over the area of concern.  She knows to not let the dialysis nurses cannulate her near this area.  She dialyzes on Tuesdays Thursdays and Saturdays.  Deitra Mayo [facility] [phone]

## 2017-03-10 NOTE — H&P (View-Only) (Signed)
   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  The consult is requested by Dr. Vanetta Mulders.  HPI:   Heather Sims is a pleasant 26 y.o. female who had a left basilic vein transposition on 12/18/2011.  She has undergone previous plication of this in May 2016.  She developed a small wound over the central portion of her fistula which is aneurysmal about 2 weeks ago and was set up for assessment here.  They have been trying to avoid this area at dialysis.  She denies any pain or paresthesias in her left arm.  Current Outpatient Medications  Medication Sig Dispense Refill  . acetaminophen (TYLENOL) 325 MG tablet Take 2 tablets (650 mg total) by mouth every 4 (four) hours as needed (pain).    . Cinacalcet HCl (SENSIPAR PO) Take 60 mg by mouth daily.     . clonazePAM (KLONOPIN) 0.5 MG tablet Take 1 tablet (0.5 mg total) by mouth 2 (two) times daily. 60 tablet 4  . levETIRAcetam (KEPPRA) 250 MG tablet TAKE ONE TABLET BY MOUTH TWICE DAILY 180 tablet 0  . sevelamer carbonate (RENVELA) 800 MG tablet Take 800 mg by mouth 3 (three) times daily with meals.     No current facility-administered medications for this visit.     REVIEW OF SYSTEMS:  [X]  denotes positive finding, [ ]  denotes negative finding Cardiac  Comments:  Chest pain or chest pressure:    Shortness of breath upon exertion:    Short of breath when lying flat:    Irregular heart rhythm:    Constitutional    Fever or chills:     PHYSICAL EXAM:   Vitals:   03/10/17 1202  BP: 103/75  Pulse: 85  Resp: 14  Temp: 97.9 F (36.6 C)  TempSrc: Oral  SpO2: 98%  Weight: 115 lb (52.2 kg)  Height: 5\' 2"  (1.575 m)    GENERAL: The patient is a well-nourished female, in no acute distress. The vital signs are documented above. CARDIOVASCULAR: There is a regular rate and rhythm. PULMONARY: There is good air exchange  bilaterally without wheezing or rales. His fistula has an excellent thrill.  There are 2 large aneurysmal areas.  The more central portion has an overlying ulcer. She has a palpable left radial pulse.  DATA:   No new data.  MEDICAL ISSUES:   ULCER OVERLYING ANEURYSMAL LEFT UPPER ARM FISTULA: Given the risk of bleeding, I have recommended plication of the aneurysm on Friday.  We have discussed the procedure and potential complications and she is agreeable to proceed.  She understands if she has any bleeding from this area to hold direct pressure over the area of concern.  She knows to not let the dialysis nurses cannulate her near this area.  She dialyzes on Tuesdays Thursdays and Saturdays.  Deitra Mayo [facility] [phone]

## 2017-03-11 ENCOUNTER — Telehealth: Payer: Self-pay | Admitting: *Deleted

## 2017-03-11 ENCOUNTER — Other Ambulatory Visit: Payer: Self-pay

## 2017-03-11 ENCOUNTER — Encounter (HOSPITAL_COMMUNITY): Payer: Self-pay | Admitting: *Deleted

## 2017-03-11 ENCOUNTER — Other Ambulatory Visit: Payer: Self-pay | Admitting: *Deleted

## 2017-03-11 NOTE — Telephone Encounter (Signed)
Left message for patient to be at [facility] admitting at 10 am on 03/12/17 for surgery and that Dr. Trula Slade will be surgeon. Requested patient call back to confirm.

## 2017-03-11 NOTE — Progress Notes (Signed)
Anesthesia Chart Review:  Pt is a same day work up  Pt is a 26 year old female scheduled for revision plication of L arm AV fistula on 03/12/2017 with Harold Barban, MD  - PCP is York Ram, MD - Nephrologist is Corliss Parish, MD - Neurologist is Margette Fast, MD. Last office visit 01/27/16 with Ward Givens, NP  PMH includes:   - Cardiac arrest (03/26/11 - she had the sudden onset of shortness of breath while out with a friend getting food. He says that he called 911 and she was still talking when EMS arrived, but suddenly developed respiratory arrest after their arrival. EMS performed CPR for PEA arrest for less than five minutes and she regained a pulse with no meds) - During hospitalization for cardiac arrest 03/2011, pt found to have cardiomyopathy (since recovered), CHF, HTN, anoxic brain injury (due to cardiac arrest), seizures (started upon rewarming after hypothermia protocol after cardiac arrest), acute kidney injury (which has since progressed to ESRD on hemodialysis), anemia.   - Pt is being evaluated for renal transplant at [facility] (notes in care everywhere)    Medications reviewed  Labs will be obtained day of surgery  EKG requested from [facility]. If it does not arrive, EKG will be obtained day of surgery.  - Report in care everywhere documents EKG 01/18/17 showed NSR.   Echo 01/19/17 (care everywhere):   Normal left ventricular systolic function, ejection fraction > 55%  Mildly elevated right atrial pressure  Normal right ventricular systolic function  No significant valvular abnormalities  If labs acceptable day of surgery, I anticipate pt can proceed with surgery as scheduled.   Willeen Cass, FNP-BC [facility]/Anesthesiology Phone: [phone] 03/11/2017 1:46 PM

## 2017-03-12 ENCOUNTER — Encounter (HOSPITAL_COMMUNITY)
Admission: RE | Disposition: A | Discharge status: Home / Self Care | Payer: Self-pay | Source: Ambulatory Visit | Attending: Surgery

## 2017-03-12 ENCOUNTER — Encounter (HOSPITAL_COMMUNITY): Payer: Self-pay | Admitting: Certified Registered Nurse Anesthetist

## 2017-03-12 ENCOUNTER — Ambulatory Visit (HOSPITAL_COMMUNITY): Payer: Medicaid Other | Admitting: Emergency Medicine

## 2017-03-12 ENCOUNTER — Ambulatory Visit (HOSPITAL_COMMUNITY)
Admission: RE | Admit: 2017-03-12 | Discharge: 2017-03-12 | Disposition: A | Discharge status: Home / Self Care | Payer: Medicaid Other | Source: Ambulatory Visit | Attending: Surgery | Admitting: Surgery

## 2017-03-12 DIAGNOSIS — Z992 Dependence on renal dialysis: Secondary | ICD-10-CM | POA: Diagnosis not present

## 2017-03-12 DIAGNOSIS — T82898A Other specified complication of vascular prosthetic devices, implants and grafts, initial encounter: Secondary | ICD-10-CM | POA: Insufficient documentation

## 2017-03-12 DIAGNOSIS — Y832 Surgical operation with anastomosis, bypass or graft as the cause of abnormal reaction of the patient, or of later complication, without mention of misadventure at the time of the procedure: Secondary | ICD-10-CM | POA: Insufficient documentation

## 2017-03-12 DIAGNOSIS — I120 Hypertensive chronic kidney disease with stage 5 chronic kidney disease or end stage renal disease: Secondary | ICD-10-CM | POA: Diagnosis not present

## 2017-03-12 DIAGNOSIS — N186 End stage renal disease: Secondary | ICD-10-CM | POA: Diagnosis not present

## 2017-03-12 DIAGNOSIS — Z79899 Other long term (current) drug therapy: Secondary | ICD-10-CM | POA: Insufficient documentation

## 2017-03-12 HISTORY — PX: REVISON OF ARTERIOVENOUS FISTULA: SHX6074

## 2017-03-12 LAB — POCT I-STAT 4, (NA,K, GLUC, HGB,HCT)
Glucose, Bld: 76 mg/dL (ref 65–99)
HCT: 41 % (ref 36.0–46.0)
HEMOGLOBIN: 13.9 g/dL (ref 12.0–15.0)
Potassium: 6.1 mmol/L — ABNORMAL HIGH (ref 3.5–5.1)
Sodium: 135 mmol/L (ref 135–145)

## 2017-03-12 LAB — I-STAT BETA HCG BLOOD, ED (NOT ORDERABLE)

## 2017-03-12 SURGERY — REVISON OF ARTERIOVENOUS FISTULA
Anesthesia: General | Site: Arm Upper | Laterality: Left

## 2017-03-12 MED ORDER — PHENYLEPHRINE 40 MCG/ML (10ML) SYRINGE FOR IV PUSH (FOR BLOOD PRESSURE SUPPORT)
PREFILLED_SYRINGE | INTRAVENOUS | Status: DC | PRN
  Administered 2017-03-12 (×2): 80 ug via INTRAVENOUS
  Administered 2017-03-12: 120 ug via INTRAVENOUS
  Administered 2017-03-12: 80 ug via INTRAVENOUS
  Administered 2017-03-12: 40 ug via INTRAVENOUS
  Administered 2017-03-12: 120 ug via INTRAVENOUS
  Administered 2017-03-12 (×2): 80 ug via INTRAVENOUS

## 2017-03-12 MED ORDER — HYDROMORPHONE HCL 1 MG/ML IJ SOLN
INTRAMUSCULAR | Status: AC
  Administered 2017-03-12: 0.5 mg via INTRAVENOUS
  Filled 2017-03-12: qty 1

## 2017-03-12 MED ORDER — MIDAZOLAM HCL 5 MG/5ML IJ SOLN
INTRAMUSCULAR | Status: DC | PRN
  Administered 2017-03-12: 2 mg via INTRAVENOUS

## 2017-03-12 MED ORDER — PROPOFOL 10 MG/ML IV BOLUS
INTRAVENOUS | Status: AC
  Filled 2017-03-12: qty 20

## 2017-03-12 MED ORDER — LIDOCAINE HCL (PF) 1 % IJ SOLN
INTRAMUSCULAR | Status: AC
  Filled 2017-03-12: qty 30

## 2017-03-12 MED ORDER — FENTANYL CITRATE (PF) 100 MCG/2ML IJ SOLN
INTRAMUSCULAR | Status: DC | PRN
  Administered 2017-03-12 (×2): 25 ug via INTRAVENOUS
  Administered 2017-03-12: 50 ug via INTRAVENOUS

## 2017-03-12 MED ORDER — HEPARIN SODIUM (PORCINE) 1000 UNIT/ML IJ SOLN
INTRAMUSCULAR | Status: DC | PRN
  Administered 2017-03-12: 3000 [IU] via INTRAVENOUS

## 2017-03-12 MED ORDER — DEXAMETHASONE SODIUM PHOSPHATE 4 MG/ML IJ SOLN
INTRAMUSCULAR | Status: DC | PRN
  Administered 2017-03-12: 10 mg via INTRAVENOUS

## 2017-03-12 MED ORDER — DEXTROSE 5 % IV SOLN
1.5000 g | INTRAVENOUS | Status: AC
  Administered 2017-03-12: 1.5 g via INTRAVENOUS
  Filled 2017-03-12 (×2): qty 1.5

## 2017-03-12 MED ORDER — SODIUM CHLORIDE 0.9 % IV SOLN
INTRAVENOUS | Status: DC
  Administered 2017-03-12 (×2): via INTRAVENOUS

## 2017-03-12 MED ORDER — OXYCODONE-ACETAMINOPHEN 5-325 MG PO TABS: 1.0000 | ORAL_TABLET | Freq: Four times a day (QID) | ORAL | 0 refills | Status: DC | PRN

## 2017-03-12 MED ORDER — ONDANSETRON HCL 4 MG/2ML IJ SOLN
INTRAMUSCULAR | Status: DC | PRN
  Administered 2017-03-12: 4 mg via INTRAVENOUS

## 2017-03-12 MED ORDER — FENTANYL CITRATE (PF) 250 MCG/5ML IJ SOLN
INTRAMUSCULAR | Status: AC
  Filled 2017-03-12: qty 5

## 2017-03-12 MED ORDER — CHLORHEXIDINE GLUCONATE 4 % EX LIQD: 60.0000 mL | Freq: Once | CUTANEOUS | Status: DC

## 2017-03-12 MED ORDER — MEPERIDINE HCL 25 MG/ML IJ SOLN: 6.2500 mg | INTRAMUSCULAR | Status: DC | PRN

## 2017-03-12 MED ORDER — LIDOCAINE 2% (20 MG/ML) 5 ML SYRINGE
INTRAMUSCULAR | Status: DC | PRN
  Administered 2017-03-12: 100 mg via INTRAVENOUS

## 2017-03-12 MED ORDER — PROPOFOL 10 MG/ML IV BOLUS
INTRAVENOUS | Status: DC | PRN
  Administered 2017-03-12: 150 mg via INTRAVENOUS
  Administered 2017-03-12: 50 mg via INTRAVENOUS

## 2017-03-12 MED ORDER — HEMOSTATIC AGENTS (NO CHARGE) OPTIME
TOPICAL | Status: DC | PRN
  Administered 2017-03-12: 1 via TOPICAL

## 2017-03-12 MED ORDER — SODIUM CHLORIDE 0.9 % IV SOLN
INTRAVENOUS | Status: DC | PRN
  Administered 2017-03-12: 500 mL

## 2017-03-12 MED ORDER — EPHEDRINE SULFATE-NACL 50-0.9 MG/10ML-% IV SOSY
PREFILLED_SYRINGE | INTRAVENOUS | Status: DC | PRN
  Administered 2017-03-12: 5 mg via INTRAVENOUS

## 2017-03-12 MED ORDER — MIDAZOLAM HCL 2 MG/2ML IJ SOLN
INTRAMUSCULAR | Status: AC
  Filled 2017-03-12: qty 2

## 2017-03-12 MED ORDER — LIDOCAINE-EPINEPHRINE (PF) 1 %-1:200000 IJ SOLN
INTRAMUSCULAR | Status: AC
  Filled 2017-03-12: qty 30

## 2017-03-12 MED ORDER — ONDANSETRON HCL 4 MG/2ML IJ SOLN: 4.0000 mg | Freq: Once | INTRAMUSCULAR | Status: DC | PRN

## 2017-03-12 MED ORDER — PROTAMINE SULFATE 10 MG/ML IV SOLN
INTRAVENOUS | Status: DC | PRN
  Administered 2017-03-12: 15 mg via INTRAVENOUS
  Administered 2017-03-12: 10 mg via INTRAVENOUS

## 2017-03-12 MED ORDER — HYDROMORPHONE HCL 1 MG/ML IJ SOLN
0.2500 mg | INTRAMUSCULAR | Status: DC | PRN
  Administered 2017-03-12: 0.5 mg via INTRAVENOUS

## 2017-03-12 MED ORDER — 0.9 % SODIUM CHLORIDE (POUR BTL) OPTIME
TOPICAL | Status: DC | PRN
  Administered 2017-03-12: 1000 mL

## 2017-03-12 SURGICAL SUPPLY — 38 items
ARMBAND PINK RESTRICT EXTREMIT (MISCELLANEOUS) ×3 IMPLANT
CANISTER SUCT 3000ML PPV (MISCELLANEOUS) ×3 IMPLANT
CLIP VESOCCLUDE MED 6/CT (CLIP) ×3 IMPLANT
CLIP VESOCCLUDE SM WIDE 6/CT (CLIP) ×3 IMPLANT
COVER PROBE W GEL 5X96 (DRAPES) IMPLANT
DERMABOND ADVANCED (GAUZE/BANDAGES/DRESSINGS) ×2
DERMABOND ADVANCED .7 DNX12 (GAUZE/BANDAGES/DRESSINGS) ×1 IMPLANT
ELECT REM PT RETURN 9FT ADLT (ELECTROSURGICAL) ×3
ELECTRODE REM PT RTRN 9FT ADLT (ELECTROSURGICAL) ×1 IMPLANT
GLOVE BIO SURGEON STRL SZ 6.5 (GLOVE) ×4 IMPLANT
GLOVE BIO SURGEONS STRL SZ 6.5 (GLOVE) ×2
GLOVE BIOGEL PI IND STRL 6.5 (GLOVE) ×2 IMPLANT
GLOVE BIOGEL PI IND STRL 7.5 (GLOVE) ×3 IMPLANT
GLOVE BIOGEL PI INDICATOR 6.5 (GLOVE) ×4
GLOVE BIOGEL PI INDICATOR 7.5 (GLOVE) ×6
GLOVE ECLIPSE 7.0 STRL STRAW (GLOVE) ×3 IMPLANT
GLOVE SURG SS PI 7.5 STRL IVOR (GLOVE) ×3 IMPLANT
GOWN STRL REUS W/ TWL LRG LVL3 (GOWN DISPOSABLE) ×2 IMPLANT
GOWN STRL REUS W/ TWL XL LVL3 (GOWN DISPOSABLE) ×1 IMPLANT
GOWN STRL REUS W/TWL LRG LVL3 (GOWN DISPOSABLE) ×4
GOWN STRL REUS W/TWL XL LVL3 (GOWN DISPOSABLE) ×2
HEMOSTAT SNOW SURGICEL 2X4 (HEMOSTASIS) ×3 IMPLANT
KIT BASIN OR (CUSTOM PROCEDURE TRAY) ×3 IMPLANT
KIT ROOM TURNOVER OR (KITS) ×3 IMPLANT
NS IRRIG 1000ML POUR BTL (IV SOLUTION) ×3 IMPLANT
PACK CV ACCESS (CUSTOM PROCEDURE TRAY) ×3 IMPLANT
PAD ARMBOARD 7.5X6 YLW CONV (MISCELLANEOUS) ×6 IMPLANT
SUT ETHILON 3 0 PS 1 (SUTURE) ×3 IMPLANT
SUT PROLENE 4 0 RB 1 (SUTURE) ×4
SUT PROLENE 4-0 RB1 .5 CRCL 36 (SUTURE) ×2 IMPLANT
SUT PROLENE 5 0 C 1 24 (SUTURE) ×12 IMPLANT
SUT PROLENE 6 0 CC (SUTURE) ×3 IMPLANT
SUT VIC AB 3-0 SH 27 (SUTURE) ×4
SUT VIC AB 3-0 SH 27X BRD (SUTURE) ×2 IMPLANT
SUT VICRYL 4-0 PS2 18IN ABS (SUTURE) ×3 IMPLANT
TOWEL GREEN STERILE (TOWEL DISPOSABLE) ×3 IMPLANT
UNDERPAD 30X30 (UNDERPADS AND DIAPERS) ×3 IMPLANT
WATER STERILE IRR 1000ML POUR (IV SOLUTION) ×3 IMPLANT

## 2017-03-12 NOTE — Anesthesia Procedure Notes (Signed)
Procedure Name: LMA Insertion Date/Time: 03/12/2017 11:02 AM Performed by: Candis Shine, CRNA Pre-anesthesia Checklist: Patient identified, Emergency Drugs available, Suction available and Patient being monitored Patient Re-evaluated:Patient Re-evaluated prior to induction Oxygen Delivery Method: Circle System Utilized Preoxygenation: Pre-oxygenation with 100% oxygen Induction Type: IV induction Ventilation: Mask ventilation without difficulty LMA: LMA inserted LMA Size: 4.0 Number of attempts: 1 Placement Confirmation: positive ETCO2 Tube secured with: Tape Dental Injury: Teeth and Oropharynx as per pre-operative assessment

## 2017-03-12 NOTE — Op Note (Signed)
    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  An elliptical incision was made incorporating the eschar.  This was fully removed.  I dissected out the aneurysm which was heavily calcified.  I resected approximately 50% of the aneurysm and closed it primarily and then closed the skin over top of this.  I did leave a more proximal aneurysm so that the patient could continue dialysis through her fistula.  Indications: Aneurysm on her left arm fistula.  She has developed a eschar with drainage and is here today for repair.  The patient has a large  Procedure:  The patient was identified in the holding area and taken to [facility]  The patient was then placed supine on the table. general anesthesia was administered.  The patient was prepped and draped in the usual sterile fashion.  A time out was called and antibiotics were administered.  An elliptical incision was made over top of the aneurysm incorporating the ulcer as well as degenerating skin.  Through this incision I circumferentially exposed the aneurysm and the fistula proximal and distal to this.  I did not disturb a more proximal aneurysm as in doing so would require a catheter placement for dialysis.  Once I had good exposure, the fistula was occluded after giving systemic heparin.  I then resected approximately 50% of the aneurysm including the overlying necrotic skin.  The fistula was heavily calcified in this area but had a healthy lumen.  I did remove some chronic thrombus here.  I had to divide the fistula in order  to remove some redundancy.  I reapproximated the posterior wall with running 5-0 Prolene.  I then closed the edges of the aneurysm with a running 5-0 Prolene in 2 layers.  After doing this, the clamps were released and there was a good thrill within the fistula.  Protamine was then given.  Hemostasis was then achieved.  I reapproximated the subcutaneous tissue anterior to the repaired aneurysm with interrupted 3-0 Vicryl and then closed the skin with 4-0 Vicryl followed by Dermabond.   Disposition: To PACU stable   V. [name], M.D. [facility] [place] Office: [phone] Pager:  [phone]

## 2017-03-12 NOTE — Interval H&P Note (Signed)
History and Physical Interval Note:  03/12/2017 9:44 AM  Heather Sims  has presented today for surgery, with the diagnosis of Left fistula aneurysm  The various methods of treatment have been discussed with the patient and family. After consideration of risks, benefits and other options for treatment, the patient has consented to  Procedure(s): [place] (Left) as a surgical intervention .  The patient's history has been reviewed, patient examined, no change in status, stable for surgery.  I have reviewed the patient's chart and labs.  Questions were answered to the patient's satisfaction.     [name]

## 2017-03-12 NOTE — Transfer of Care (Signed)
Immediate Anesthesia Transfer of Care Note  Patient: Heather Sims  Procedure(s) Performed: REVISION PLICATION OF ARTERIOVENOUS FISTULA LEFT UPPER ARM (Left Arm Upper)  Patient Location: PACU  Anesthesia Type:General  Level of Consciousness: drowsy  Airway & Oxygen Therapy: Patient Spontanous Breathing and Patient connected to nasal cannula oxygen  Post-op Assessment: Report given to RN and Post -op Vital signs reviewed and stable  Post vital signs: Reviewed and stable  Last Vitals:  Vitals:   03/12/17 0827 03/12/17 1247  BP: 101/76 109/80  Pulse: 91 97  Resp: 18 (!) 22  Temp: 36.6 C   SpO2: 99% 100%    Last Pain:  Vitals:   03/12/17 0827  TempSrc: Oral      Patients Stated Pain Goal: 2 (73/56/70 1410)  Complications: No apparent anesthesia complications

## 2017-03-12 NOTE — Progress Notes (Signed)
K+ 6.1, sending Istat tube to main lab for new K+ level.

## 2017-03-12 NOTE — Anesthesia Postprocedure Evaluation (Signed)
Anesthesia Post Note  Patient: Heather Sims  Procedure(s) Performed: REVISION PLICATION OF ARTERIOVENOUS FISTULA LEFT UPPER ARM (Left Arm Upper)     Patient location during evaluation: PACU Anesthesia Type: General Level of consciousness: awake and alert Pain management: pain level controlled Vital Signs Assessment: post-procedure vital signs reviewed and stable Respiratory status: spontaneous breathing, nonlabored ventilation, respiratory function stable and patient connected to nasal cannula oxygen Cardiovascular status: blood pressure returned to baseline and stable Postop Assessment: no apparent nausea or vomiting Anesthetic complications: no    Last Vitals:  Vitals:   03/12/17 1430 03/12/17 1440  BP:    Pulse: 95 96  Resp:    Temp:    SpO2: 96% 97%    Last Pain:  Vitals:   03/12/17 0827  TempSrc: Oral                 Feliza Diven DAVID

## 2017-03-12 NOTE — Anesthesia Preprocedure Evaluation (Signed)
Anesthesia Evaluation  Patient identified by MRN, date of birth, ID band Patient awake    Reviewed: Allergy & Precautions, NPO status , Patient's Chart, lab work & pertinent test results  Airway Mallampati: I  TM Distance: >3 FB Neck ROM: Full    Dental   Pulmonary    Pulmonary exam normal        Cardiovascular hypertension, Pt. on medications Normal cardiovascular exam     Neuro/Psych Depression    GI/Hepatic   Endo/Other    Renal/GU ESRF and DialysisRenal disease     Musculoskeletal   Abdominal   Peds  Hematology   Anesthesia Other Findings   Reproductive/Obstetrics                             Anesthesia Physical Anesthesia Plan  ASA: III  Anesthesia Plan: General   Post-op Pain Management:    Induction: Intravenous  PONV Risk Score and Plan: 3 and Ondansetron, Treatment may vary due to age or medical condition and Midazolam  Airway Management Planned: LMA  Additional Equipment:   Intra-op Plan:   Post-operative Plan: Extubation in OR  Informed Consent: I have reviewed the patients History and Physical, chart, labs and discussed the procedure including the risks, benefits and alternatives for the proposed anesthesia with the patient or authorized representative who has indicated his/her understanding and acceptance.     Plan Discussed with: CRNA and Surgeon  Anesthesia Plan Comments:         Anesthesia Quick Evaluation

## 2017-03-12 NOTE — Interval H&P Note (Signed)
History and Physical Interval Note:  03/12/2017 9:45 AM  Heather Sims  has presented today for surgery, with the diagnosis of Left fistula aneurysm  The various methods of treatment have been discussed with the patient and family. After consideration of risks, benefits and other options for treatment, the patient has consented to  Procedure(s): [place] [facility] [place] (Left) as a surgical intervention .  The patient's history has been reviewed, patient examined, no change in status, stable for surgery.  I have reviewed the patient's chart and labs.  Questions were answered to the patient's satisfaction.     [name]

## 2017-03-12 NOTE — Progress Notes (Signed)
Call fr. Verdene Lennert in the main lab  that ISTAT specimen is hemolyzed, new specimen will be obtained.[place] lab will redraw from R arm, IV clamped for 10 + minutes.

## 2017-03-12 NOTE — Progress Notes (Signed)
MD & P.A. @bedside  to assess incision site & agree there are no unexpected changes noted. No new orders.

## 2017-03-13 ENCOUNTER — Encounter (HOSPITAL_COMMUNITY): Payer: Self-pay | Admitting: Surgery

## 2017-03-15 LAB — POCT I-STAT 4, (NA,K, GLUC, HGB,HCT)
Glucose, Bld: 80 mg/dL (ref 65–99)
HCT: 39 % (ref 36.0–46.0)
HEMOGLOBIN: 13.3 g/dL (ref 12.0–15.0)
POTASSIUM: 4.8 mmol/L (ref 3.5–5.1)
SODIUM: 137 mmol/L (ref 135–145)

## 2017-03-17 DIAGNOSIS — Z7682 Awaiting organ transplant status: Principal | ICD-10-CM

## 2017-03-17 DIAGNOSIS — Z01818 Encounter for other preprocedural examination: Secondary | ICD-10-CM

## 2017-03-17 DIAGNOSIS — N186 End stage renal disease: Secondary | ICD-10-CM

## 2017-03-18 ENCOUNTER — Telehealth: Payer: Self-pay | Admitting: Surgery

## 2017-03-18 NOTE — Telephone Encounter (Signed)
LVM for pts appt 04/12/17 mailed lttr bg

## 2017-03-18 NOTE — Telephone Encounter (Signed)
-----   Message from Mena Goes, RN sent at 03/12/2017  2:47 PM EST ----- Regarding: 2-3 weeks    ----- Message ----- From: Ulyses Amor, PA-C Sent: 03/12/2017   1:03 PM To: Vvs Charge Pool  F/U with Dr. Trula Slade in 2-3 weeks s/p plication left av fistula

## 2017-04-12 ENCOUNTER — Encounter: Payer: Medicaid Other | Admitting: Surgery

## 2017-04-13 ENCOUNTER — Encounter
Admit: 2017-04-13 | Discharge: 2017-04-13 | Payer: PRIVATE HEALTH INSURANCE | Attending: Nephrology | Primary: Nephrology

## 2017-04-14 ENCOUNTER — Encounter: Payer: Self-pay | Admitting: Neurology

## 2017-04-14 ENCOUNTER — Ambulatory Visit: Payer: Medicaid Other | Admitting: Neurology

## 2017-04-14 ENCOUNTER — Other Ambulatory Visit: Payer: Self-pay

## 2017-04-14 VITALS — BP 104/70 | HR 93 | Ht 62.0 in | Wt 115.0 lb

## 2017-04-14 DIAGNOSIS — G40409 Other generalized epilepsy and epileptic syndromes, not intractable, without status epilepticus: Secondary | ICD-10-CM

## 2017-04-14 MED ORDER — LEVETIRACETAM 250 MG PO TABS: 250.0000 mg | ORAL_TABLET | Freq: Two times a day (BID) | ORAL | 3 refills | Status: DC

## 2017-04-14 NOTE — Progress Notes (Signed)
Reason for visit: Seizures  Heather Sims is an 27 y.o. female  History of present illness:  Ms. Hollett is a 27 year old right-handed black female with a history of a cardiac arrest and anoxic brain injury.  The patient has history of seizures, she is on hemodialysis with end-stage renal disease.  The patient last had a seizure about 5 years ago.  She is on Keppra taking 250 mg twice daily, she tolerates the medication well.  She is trying to learn how to drive a car.  She uses a walker for ambulation, she denies any recent falls.  Overall, she is doing quite well.  Past Medical History:  Diagnosis Date  . Anemia   . Brain injury ([place])   . Cardiac arrest ([place]) 03/26/2011  . Cardiomyopathy   . CHF (congestive heart failure) ([place])   . Chronic kidney disease    [facility]  . Depression   . Family history of adverse reaction to anesthesia    Mother hard to wake up.  . Gait disorder 01/11/2013  . Heart failure   . History of blood transfusion 11/2011  . Hypertension   . Ileitis   . MPGN (membranoproliferative glomerulonephritis), type 2   . Pneumonia    'walking pneumonia'  . Renal disorder   . Seizures ([place])    2013- last time    Past Surgical History:  Procedure Laterality Date  . AV FISTULA PLACEMENT  12/18/2011   Procedure: ARTERIOVENOUS (AV) FISTULA CREATION;  Surgeon: Angelia Mould, MD;  Location: [facility];  Service: Vascular;  Laterality: Left;  . INSERTION OF DIALYSIS CATHETER Right 08/06/2014   Procedure: INSERTION OF DIALYSIS CATHETER;  Surgeon: Elam Dutch, MD;  Location: [facility];  Service: Vascular;  Laterality: Right;  . RENAL BIOPSY    . REVISON OF ARTERIOVENOUS FISTULA Left 3/81/8299   Procedure: PLICATION OF ARTERIOVENOUS FISTULA;  Surgeon: Elam Dutch, MD;  Location: [facility];  Service: Vascular;  Laterality: Left;  . REVISON OF ARTERIOVENOUS FISTULA Left 03/12/2017   Procedure: REVISION PLICATION OF ARTERIOVENOUS FISTULA LEFT UPPER ARM;   Surgeon: Serafina Mitchell, MD;  [facility];  Service: Vascular;  Laterality: Left;    Family History  Problem Relation Age of Onset  . Hypertension Mother   . Diabetes Father   . Hyperlipidemia Father     Social history:  reports that  has never smoked. she has never used smokeless tobacco. She reports that she does not drink alcohol or use drugs.    Allergies  Allergen Reactions  . Pollen Extract Swelling    Medications:  Prior to Admission medications   Medication Sig Start Date End Date Taking? Authorizing Provider  acetaminophen (TYLENOL) 325 MG tablet Take 2 tablets (650 mg total) by mouth every 4 (four) hours as needed (pain). 12/18/11  Yes Love, Ivan Anchors, PA-C  Cinacalcet HCl (SENSIPAR PO) Take 60 mg by mouth daily.    Yes [provider]  clonazePAM (KLONOPIN) 0.5 MG tablet Take 1 tablet (0.5 mg total) by mouth 2 (two) times daily. 09/30/16  Yes Meredith Staggers, MD  levETIRAcetam (KEPPRA) 250 MG tablet TAKE ONE TABLET BY MOUTH TWICE DAILY 02/17/17  Yes Kathrynn Ducking, MD  sevelamer carbonate (RENVELA) 800 MG tablet Take 800 mg by mouth 3 (three) times daily with meals.   Yes [provider]    ROS:  Out of a complete 14 system review of symptoms, the patient complains only of the  following symptoms, and all other reviewed systems are negative.  Balance problems  Blood pressure 104/70, pulse 93, height 5\' 2"  (1.575 m), weight 115 lb (52.2 kg).  Physical Exam  General: The patient is alert and cooperative at the time of the examination.  Skin: No significant peripheral edema is noted.   Neurologic Exam  Mental status: The patient is alert and oriented x 3 at the time of the examination. The patient has apparent normal recent and remote memory, with an apparently normal attention span and concentration ability.   Cranial nerves: Facial symmetry is present. Speech is normal, no aphasia or dysarthria is noted. Extraocular movements are  full. Visual fields are full.  Motor: The patient has good strength in all 4 extremities.  Sensory examination: Soft touch sensation is symmetric on the face, arms, and legs.  Coordination: The patient has good finger-nose-finger and heel-to-shin bilaterally.  Gait and station: The patient has a wide-based gait, unsteady.  The patient normally uses a walker for ambulation.  Tandem gait was not attempted.  Romberg is negative.  Reflexes: Deep tendon reflexes are symmetric.   Assessment/Plan:  1.  Anoxic brain injury  2.  Seizures  The patient has done well with her seizure control, she will continue the current dose of Keppra, she will be given a prescription for the medication.  She will follow-up in 1 year.  Jill Alexanders MD 04/14/2017 12:18 PM  [facility] [address] [place], [place] [address]  Phone [phone] Fax [phone]

## 2017-04-16 DIAGNOSIS — N186 End stage renal disease: Secondary | ICD-10-CM

## 2017-04-16 DIAGNOSIS — Z7682 Awaiting organ transplant status: Principal | ICD-10-CM

## 2017-04-16 DIAGNOSIS — Z01818 Encounter for other preprocedural examination: Secondary | ICD-10-CM

## 2017-04-19 ENCOUNTER — Other Ambulatory Visit: Payer: Self-pay | Admitting: Physical Medicine & Rehabilitation

## 2017-04-19 DIAGNOSIS — G253 Myoclonus: Secondary | ICD-10-CM

## 2017-04-20 ENCOUNTER — Other Ambulatory Visit: Payer: Self-pay | Admitting: Physical Medicine & Rehabilitation

## 2017-04-20 DIAGNOSIS — G253 Myoclonus: Secondary | ICD-10-CM

## 2017-04-27 ENCOUNTER — Encounter
Admit: 2017-04-27 | Discharge: 2017-04-27 | Payer: PRIVATE HEALTH INSURANCE | Attending: Nephrology | Primary: Nephrology

## 2017-04-30 DIAGNOSIS — N186 End stage renal disease: Secondary | ICD-10-CM

## 2017-04-30 DIAGNOSIS — Z7682 Awaiting organ transplant status: Principal | ICD-10-CM

## 2017-04-30 DIAGNOSIS — Z01818 Encounter for other preprocedural examination: Secondary | ICD-10-CM

## 2017-05-25 ENCOUNTER — Encounter
Admit: 2017-05-25 | Discharge: 2017-05-25 | Payer: PRIVATE HEALTH INSURANCE | Attending: Nephrology | Primary: Nephrology

## 2017-05-28 DIAGNOSIS — Z7682 Awaiting organ transplant status: Principal | ICD-10-CM

## 2017-05-28 DIAGNOSIS — N186 End stage renal disease: Secondary | ICD-10-CM

## 2017-05-28 DIAGNOSIS — Z01818 Encounter for other preprocedural examination: Secondary | ICD-10-CM

## 2017-06-22 ENCOUNTER — Encounter
Admit: 2017-06-22 | Discharge: 2017-06-22 | Payer: PRIVATE HEALTH INSURANCE | Attending: Nephrology | Primary: Nephrology

## 2017-07-01 DIAGNOSIS — N186 End stage renal disease: Secondary | ICD-10-CM

## 2017-07-01 DIAGNOSIS — Z7682 Awaiting organ transplant status: Principal | ICD-10-CM

## 2017-07-01 DIAGNOSIS — Z01818 Encounter for other preprocedural examination: Secondary | ICD-10-CM

## 2017-07-27 ENCOUNTER — Encounter
Admit: 2017-07-27 | Discharge: 2017-07-27 | Payer: PRIVATE HEALTH INSURANCE | Attending: Nephrology | Primary: Nephrology

## 2017-07-29 DIAGNOSIS — Z01818 Encounter for other preprocedural examination: Secondary | ICD-10-CM

## 2017-07-29 DIAGNOSIS — N186 End stage renal disease: Secondary | ICD-10-CM

## 2017-07-29 DIAGNOSIS — Z7682 Awaiting organ transplant status: Principal | ICD-10-CM

## 2017-08-24 ENCOUNTER — Encounter
Admit: 2017-08-24 | Discharge: 2017-08-24 | Payer: PRIVATE HEALTH INSURANCE | Attending: Nephrology | Primary: Nephrology

## 2017-08-26 DIAGNOSIS — N186 End stage renal disease: Secondary | ICD-10-CM

## 2017-08-26 DIAGNOSIS — Z01818 Encounter for other preprocedural examination: Secondary | ICD-10-CM

## 2017-08-26 DIAGNOSIS — Z7682 Awaiting organ transplant status: Principal | ICD-10-CM

## 2017-09-21 ENCOUNTER — Encounter
Admit: 2017-09-21 | Discharge: 2017-09-21 | Payer: PRIVATE HEALTH INSURANCE | Attending: Nephrology | Primary: Nephrology

## 2017-09-24 DIAGNOSIS — Z7682 Awaiting organ transplant status: Principal | ICD-10-CM

## 2017-09-24 DIAGNOSIS — N186 End stage renal disease: Secondary | ICD-10-CM

## 2017-09-24 DIAGNOSIS — Z01818 Encounter for other preprocedural examination: Secondary | ICD-10-CM

## 2017-09-29 ENCOUNTER — Encounter: Payer: Medicaid Other | Admitting: Physical Medicine & Rehabilitation

## 2017-10-18 ENCOUNTER — Other Ambulatory Visit: Payer: Self-pay

## 2017-10-18 ENCOUNTER — Encounter: Payer: Medicaid Other | Attending: Physical Medicine & Rehabilitation | Admitting: Physical Medicine & Rehabilitation

## 2017-10-18 ENCOUNTER — Encounter: Payer: Self-pay | Admitting: Physical Medicine & Rehabilitation

## 2017-10-18 VITALS — BP 115/80 | HR 92 | Ht 62.0 in | Wt 118.0 lb

## 2017-10-18 DIAGNOSIS — G931 Anoxic brain damage, not elsewhere classified: Secondary | ICD-10-CM | POA: Diagnosis not present

## 2017-10-18 DIAGNOSIS — N189 Chronic kidney disease, unspecified: Secondary | ICD-10-CM | POA: Diagnosis not present

## 2017-10-18 DIAGNOSIS — G253 Myoclonus: Secondary | ICD-10-CM

## 2017-10-18 DIAGNOSIS — I509 Heart failure, unspecified: Secondary | ICD-10-CM | POA: Diagnosis not present

## 2017-10-18 DIAGNOSIS — I130 Hypertensive heart and chronic kidney disease with heart failure and stage 1 through stage 4 chronic kidney disease, or unspecified chronic kidney disease: Secondary | ICD-10-CM | POA: Insufficient documentation

## 2017-10-18 DIAGNOSIS — F329 Major depressive disorder, single episode, unspecified: Secondary | ICD-10-CM | POA: Diagnosis not present

## 2017-10-18 DIAGNOSIS — G40909 Epilepsy, unspecified, not intractable, without status epilepticus: Secondary | ICD-10-CM | POA: Insufficient documentation

## 2017-10-18 DIAGNOSIS — Z8249 Family history of ischemic heart disease and other diseases of the circulatory system: Secondary | ICD-10-CM | POA: Diagnosis not present

## 2017-10-18 DIAGNOSIS — D649 Anemia, unspecified: Secondary | ICD-10-CM | POA: Diagnosis not present

## 2017-10-18 DIAGNOSIS — D696 Thrombocytopenia, unspecified: Secondary | ICD-10-CM | POA: Insufficient documentation

## 2017-10-18 DIAGNOSIS — I469 Cardiac arrest, cause unspecified: Secondary | ICD-10-CM | POA: Diagnosis not present

## 2017-10-18 DIAGNOSIS — R269 Unspecified abnormalities of gait and mobility: Secondary | ICD-10-CM

## 2017-10-18 DIAGNOSIS — R2689 Other abnormalities of gait and mobility: Secondary | ICD-10-CM | POA: Insufficient documentation

## 2017-10-18 DIAGNOSIS — I429 Cardiomyopathy, unspecified: Secondary | ICD-10-CM | POA: Insufficient documentation

## 2017-10-18 MED ORDER — CLONAZEPAM 0.5 MG PO TABS: 0.5000 mg | ORAL_TABLET | Freq: Two times a day (BID) | ORAL | 5 refills | Status: DC

## 2017-10-18 NOTE — Progress Notes (Signed)
Subjective:    Patient ID: Heather Sims, female    DOB: 12/17/1990, 27 y.o.   MRN: [id number]  HPI   Heather Sims is here in follow up of her gait disorder and myoclonus. She is using her walker predominantly. She wants to transition to a cane but hasn't had someone who can walk with her. She does walk with the cane at home. neurorehab recommended using a straight cane as opposed to a quad cane as well.   She denies any new health issues.   Her myoclonus has been fairly well controlled. She notices it on occasion in the evening or first thing in the morning. She is on klonopin twice daily     Pain Inventory Average Pain 0 Pain Right Now 0 My pain is no pain  In the last 24 hours, has pain interfered with the following? General activity 0 Relation with others 0 Enjoyment of life 0 What TIME of day is your pain at its worst? no pain Sleep (in general) Good  Pain is worse with: no pain Pain improves with: no pain Relief from Meds: no pain  Mobility walk with assistance use a cane use a walker ability to climb steps?  yes  Function disabled: date disabled n/a  Neuro/Psych tremor  Prior Studies Any changes since last visit?  no  Physicians involved in your care Any changes since last visit?  no   Family History  Problem Relation Age of Onset  . Hypertension Mother   . Diabetes Father   . Hyperlipidemia Father    Social History   Socioeconomic History  . Marital status: Single    Spouse name: Not on file  . Number of children: 0  . Years of education: college jr  . Highest education level: Not on file  Occupational History  . Not on file  Social Needs  . Financial resource strain: Not on file  . Food insecurity:    Worry: Not on file    Inability: Not on file  . Transportation needs:    Medical: Not on file    Non-medical: Not on file  Tobacco Use  . Smoking status: Never Smoker  . Smokeless tobacco: Never Used  Substance and Sexual Activity  .  Alcohol use: No    Alcohol/week: 0.0 oz  . Drug use: No  . Sexual activity: Never    Birth control/protection: Abstinence  Lifestyle  . Physical activity:    Days per week: Not on file    Minutes per session: Not on file  . Stress: Not on file  Relationships  . Social connections:    Talks on phone: Not on file    Gets together: Not on file    Attends religious service: Not on file    Active member of club or organization: Not on file    Attends meetings of clubs or organizations: Not on file    Relationship status: Not on file  Other Topics Concern  . Not on file  Social History Narrative   ** Merged History Encounter **       Past Surgical History:  Procedure Laterality Date  . AV FISTULA PLACEMENT  12/18/2011   Procedure: ARTERIOVENOUS (AV) FISTULA CREATION;  Surgeon: Angelia Mould, MD;  Location: [facility];  Service: Vascular;  Laterality: Left;  . INSERTION OF DIALYSIS CATHETER Right 08/06/2014   Procedure: INSERTION OF DIALYSIS CATHETER;  Surgeon: Elam Dutch, MD;  Location: [facility];  Service: Vascular;  Laterality: Right;  .  RENAL BIOPSY    . REVISON OF ARTERIOVENOUS FISTULA Left 1/44/3154   Procedure: PLICATION OF ARTERIOVENOUS FISTULA;  Surgeon: Elam Dutch, MD;  Location: [facility];  Service: Vascular;  Laterality: Left;  . REVISON OF ARTERIOVENOUS FISTULA Left 03/12/2017   Procedure: REVISION PLICATION OF ARTERIOVENOUS FISTULA LEFT UPPER ARM;  Surgeon: Serafina Mitchell, MD;  [facility];  Service: Vascular;  Laterality: Left;   Past Medical History:  Diagnosis Date  . Anemia   . Brain injury ([place])   . Cardiac arrest ([place]) 03/26/2011  . Cardiomyopathy   . CHF (congestive heart failure) ([place])   . Chronic kidney disease    [facility]  . Depression   . Family history of adverse reaction to anesthesia    Mother hard to wake up.  . Gait disorder 01/11/2013  . Heart failure   . History of blood transfusion 11/2011  . Hypertension   . Ileitis   .  MPGN (membranoproliferative glomerulonephritis), type 2   . Pneumonia    'walking pneumonia'  . Renal disorder   . Seizures ([place])    2013- last time   BP 115/80   Pulse 92   Ht 5\' 2"  (1.575 m)   Wt 118 lb (53.5 kg)   SpO2 99%   BMI 21.58 kg/m   Opioid Risk Score:   Fall Risk Score:  `1  Depression screen PHQ 2/9  Depression screen [facility] 2/9 10/18/2017 07/02/2014  Decreased Interest 1 1  Down, Depressed, Hopeless 1 1  PHQ - 2 Score 2 2  Altered sleeping - 1  Tired, decreased energy - 1  Change in appetite - 0  Feeling bad or failure about yourself  - 2  Trouble concentrating - 0  Moving slowly or fidgety/restless - 0  Suicidal thoughts - 0  PHQ-9 Score - 6   Review of Systems  Constitutional: Negative.   HENT: Negative.   Eyes: Negative.   Respiratory: Negative.   Cardiovascular: Negative.   Gastrointestinal: Negative.   Endocrine: Negative.   Genitourinary: Negative.   Musculoskeletal: Negative.   Skin: Negative.   Allergic/Immunologic: Negative.   Neurological: Negative.   Hematological: Negative.   Psychiatric/Behavioral: Negative.  Negative for hallucinations.  All other systems reviewed and are negative.      Objective:   Physical Exam General: No acute distress HEENT: EOMI, oral membranes moist Cards: reg rate  Chest: normal effort Abdomen: Soft, NT, ND Skin: dry, intact Extremities: no edema Neurological: She is alert and oriented to person, place, and time. No cranial nerve deficit.    Strength in the legs is grossly 55 in UE's. She is 5/5 LE'S now. Gait pattern has improved. Less impulsice. Gait fluid with walker. Legs shake quite a bit when walking with HHA. Did not have cane with her today. No clonus. Psychiatric: more dynamic today.    Assessment & Plan:  1. 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 Myoclonus.  3. Seizure disorder  4. Chronic renal failure  5. Hypertension. Substantial elevation today  6. Anemia.  7.  Thrombocytopenia - improved.  8. Mood -much improved    Plan:  1. discussed HEP. Would benefit from walking in pool to build up balance and stamina. Also might do well on treadmill. 2. Maintain klonopin to 0.5mg  at bid for myoclonus. Continue to take after HD and before bed on HD days  3. Continue with keppra for seizure prophylaxis  4.  Follow up with me in about 12 months.  15 minutes of face to face patient care time were spent during this visit. All questions were encouraged and answered    Greater than 50% of time during this encounter was spent counseling patient/family in regard to HEP and specific exercise options.

## 2017-10-18 NOTE — Patient Instructions (Signed)
PLEASE FEEL FREE TO CALL OUR OFFICE WITH ANY PROBLEMS OR QUESTIONS ([phone])      

## 2017-10-26 ENCOUNTER — Encounter
Admit: 2017-10-26 | Discharge: 2017-10-26 | Payer: PRIVATE HEALTH INSURANCE | Attending: Nephrology | Primary: Nephrology

## 2017-10-28 DIAGNOSIS — N186 End stage renal disease: Secondary | ICD-10-CM

## 2017-10-28 DIAGNOSIS — Z7682 Awaiting organ transplant status: Principal | ICD-10-CM

## 2017-10-28 DIAGNOSIS — Z01818 Encounter for other preprocedural examination: Secondary | ICD-10-CM

## 2017-11-23 ENCOUNTER — Encounter
Admit: 2017-11-23 | Discharge: 2017-11-23 | Payer: PRIVATE HEALTH INSURANCE | Attending: Nephrology | Primary: Nephrology

## 2017-11-26 DIAGNOSIS — Z7682 Awaiting organ transplant status: Principal | ICD-10-CM

## 2017-11-26 DIAGNOSIS — N186 End stage renal disease: Secondary | ICD-10-CM

## 2017-11-26 DIAGNOSIS — Z01818 Encounter for other preprocedural examination: Secondary | ICD-10-CM

## 2017-12-21 ENCOUNTER — Encounter
Admit: 2017-12-21 | Discharge: 2017-12-21 | Payer: PRIVATE HEALTH INSURANCE | Attending: Nephrology | Primary: Nephrology

## 2017-12-29 DIAGNOSIS — N186 End stage renal disease: Secondary | ICD-10-CM

## 2017-12-29 DIAGNOSIS — Z01818 Encounter for other preprocedural examination: Secondary | ICD-10-CM

## 2017-12-29 DIAGNOSIS — Z7682 Awaiting organ transplant status: Principal | ICD-10-CM

## 2018-01-25 ENCOUNTER — Encounter
Admit: 2018-01-25 | Discharge: 2018-01-25 | Payer: PRIVATE HEALTH INSURANCE | Attending: Nephrology | Primary: Nephrology

## 2018-01-28 DIAGNOSIS — Z01818 Encounter for other preprocedural examination: Secondary | ICD-10-CM

## 2018-01-28 DIAGNOSIS — Z7682 Awaiting organ transplant status: Principal | ICD-10-CM

## 2018-01-28 DIAGNOSIS — N186 End stage renal disease: Secondary | ICD-10-CM

## 2018-02-22 ENCOUNTER — Encounter
Admit: 2018-02-22 | Discharge: 2018-02-22 | Payer: PRIVATE HEALTH INSURANCE | Attending: Nephrology | Primary: Nephrology

## 2018-02-24 ENCOUNTER — Encounter: Admit: 2018-02-24 | Discharge: 2018-02-24 | Payer: PRIVATE HEALTH INSURANCE

## 2018-02-25 ENCOUNTER — Encounter: Admit: 2018-02-25 | Discharge: 2018-02-25 | Payer: PRIVATE HEALTH INSURANCE

## 2018-02-25 ENCOUNTER — Encounter: Admit: 2018-02-25 | Discharge: 2018-03-26 | Payer: PRIVATE HEALTH INSURANCE

## 2018-02-25 ENCOUNTER — Encounter: Admit: 2018-02-25 | Discharge: 2018-03-10 | Payer: PRIVATE HEALTH INSURANCE

## 2018-02-25 DIAGNOSIS — Z7682 Awaiting organ transplant status: Principal | ICD-10-CM

## 2018-02-25 DIAGNOSIS — N186 End stage renal disease: Secondary | ICD-10-CM

## 2018-02-25 DIAGNOSIS — Z01810 Encounter for preprocedural cardiovascular examination: Secondary | ICD-10-CM

## 2018-02-25 DIAGNOSIS — Z729 Problem related to lifestyle, unspecified: Secondary | ICD-10-CM

## 2018-02-28 ENCOUNTER — Encounter: Admit: 2018-02-28 | Discharge: 2018-03-01 | Payer: PRIVATE HEALTH INSURANCE

## 2018-02-28 ENCOUNTER — Ambulatory Visit
Admit: 2018-02-28 | Discharge: 2018-03-01 | Payer: PRIVATE HEALTH INSURANCE | Attending: Health Service | Primary: Health Service

## 2018-02-28 ENCOUNTER — Ambulatory Visit: Admit: 2018-02-28 | Discharge: 2018-03-01 | Payer: PRIVATE HEALTH INSURANCE | Attending: Surgery | Primary: Surgery

## 2018-02-28 DIAGNOSIS — Z992 Dependence on renal dialysis: Secondary | ICD-10-CM

## 2018-02-28 DIAGNOSIS — N186 End stage renal disease: Principal | ICD-10-CM

## 2018-02-28 DIAGNOSIS — Z01818 Encounter for other preprocedural examination: Secondary | ICD-10-CM

## 2018-02-28 DIAGNOSIS — Z7682 Awaiting organ transplant status: Principal | ICD-10-CM

## 2018-03-29 ENCOUNTER — Encounter
Admit: 2018-03-29 | Discharge: 2018-03-29 | Payer: PRIVATE HEALTH INSURANCE | Attending: Nephrology | Primary: Nephrology

## 2018-04-05 DIAGNOSIS — Z7682 Awaiting organ transplant status: Principal | ICD-10-CM

## 2018-04-05 DIAGNOSIS — N186 End stage renal disease: Secondary | ICD-10-CM

## 2018-04-05 DIAGNOSIS — Z01818 Encounter for other preprocedural examination: Secondary | ICD-10-CM

## 2018-04-18 ENCOUNTER — Ambulatory Visit: Payer: Medicaid Other | Admitting: Adult Health

## 2018-04-18 ENCOUNTER — Telehealth: Payer: Self-pay | Admitting: *Deleted

## 2018-04-18 NOTE — Telephone Encounter (Signed)
[place]. Pt. has an appt. with Jinny Blossom this afternoon; Jinny Blossom is out of the office due to illness, so I just need to r/s this appt for pt./fim

## 2018-04-26 ENCOUNTER — Encounter
Admit: 2018-04-26 | Discharge: 2018-04-26 | Payer: PRIVATE HEALTH INSURANCE | Attending: Nephrology | Primary: Nephrology

## 2018-04-27 ENCOUNTER — Encounter
Admit: 2018-04-27 | Discharge: 2018-04-27 | Payer: PRIVATE HEALTH INSURANCE | Attending: Student in an Organized Health Care Education/Training Program | Primary: Student in an Organized Health Care Education/Training Program

## 2018-04-27 DIAGNOSIS — N186 End stage renal disease: Secondary | ICD-10-CM

## 2018-04-27 DIAGNOSIS — Z01818 Encounter for other preprocedural examination: Principal | ICD-10-CM

## 2018-04-29 DIAGNOSIS — N186 End stage renal disease: Secondary | ICD-10-CM

## 2018-04-29 DIAGNOSIS — Z01818 Encounter for other preprocedural examination: Secondary | ICD-10-CM

## 2018-04-29 DIAGNOSIS — Z7682 Awaiting organ transplant status: Principal | ICD-10-CM

## 2018-05-07 ENCOUNTER — Other Ambulatory Visit: Payer: Self-pay | Admitting: Neurology

## 2018-05-07 ENCOUNTER — Other Ambulatory Visit: Payer: Self-pay | Admitting: Physical Medicine & Rehabilitation

## 2018-05-07 DIAGNOSIS — G253 Myoclonus: Secondary | ICD-10-CM

## 2018-05-09 ENCOUNTER — Other Ambulatory Visit: Payer: Self-pay | Admitting: Physical Medicine & Rehabilitation

## 2018-05-09 DIAGNOSIS — G253 Myoclonus: Secondary | ICD-10-CM

## 2018-05-19 NOTE — Progress Notes (Signed)
PATIENT: Heather Sims DOB: October 12, 1990  REASON FOR VISIT: follow up HISTORY FROM: patient  HISTORY OF PRESENT ILLNESS: Today 05/23/18  Heather Sims is a 28 year old female who presents for yearly follow-up with history of seizures.  She is on hemodialysis with end-stage renal disease.  She is currently taking Keppra 250 mg twice daily and is tolerating medication well.  She reports her last seizure was about 5 years ago.  She is currently on the kidney transplant list at [facility].  She does not drive a car but she reports she is learning.  She lives with her mom who brought her to this appointment today.  She denies any new problems or concerns.  She is using a walker today.  She reports she handles her own medications.  She presents today for follow-up.  HISTORY 04/14/2017 Dr. Jannifer Franklin Heather Sims is a 28 year old right-handed black female with a history of a cardiac arrest and anoxic brain injury.  The patient has history of seizures, she is on hemodialysis with end-stage renal disease.  The patient last had a seizure about 5 years ago.  She is on Keppra taking 250 mg twice daily, she tolerates the medication well.  She is trying to learn how to drive a car.  She uses a walker for ambulation, she denies any recent falls.  Overall, she is doing quite well.  REVIEW OF SYSTEMS: Out of a complete 14 system review of symptoms, the patient complains only of the following symptoms, and all other reviewed systems are negative.  ALLERGIES: Allergies  Allergen Reactions  . Pollen Extract     Sneezing, congestion denies swelling.    HOME MEDICATIONS: Outpatient Medications Prior to Visit  Medication Sig Dispense Refill  . acetaminophen (TYLENOL) 325 MG tablet Take 2 tablets (650 mg total) by mouth every 4 (four) hours as needed (pain).    . Cinacalcet HCl (SENSIPAR PO) Take 60 mg by mouth 2 (two) times daily.     . clonazePAM (KLONOPIN) 0.5 MG tablet TAKE 1 TABLET BY MOUTH TWICE DAILY  60 tablet 1  . OVER THE COUNTER MEDICATION Take by mouth daily. Phosphorous    . sevelamer carbonate (RENVELA) 800 MG tablet Take 800 mg by mouth 3 (three) times daily with meals.    . levETIRAcetam (KEPPRA) 250 MG tablet TAKE 1 TABLET BY MOUTH TWICE DAILY.  YOU NEED TO CALL [phone] AND SCHEDULE FOLLOW UP APPOINTMENT. 180 tablet 0   No facility-administered medications prior to visit.     PAST MEDICAL HISTORY: Past Medical History:  Diagnosis Date  . Anemia   . Brain injury ([place])   . Cardiac arrest ([place]) 03/26/2011  . Cardiomyopathy   . CHF (congestive heart failure) ([place])   . Chronic kidney disease    [facility]  . Depression   . Family history of adverse reaction to anesthesia    Mother hard to wake up.  . Gait disorder 01/11/2013  . Heart failure   . History of blood transfusion 11/2011  . Hypertension   . Ileitis   . MPGN (membranoproliferative glomerulonephritis), type 2   . Pneumonia    'walking pneumonia'  . Renal disorder   . Seizures ([place])    2013- last time    PAST SURGICAL HISTORY: Past Surgical History:  Procedure Laterality Date  . AV FISTULA PLACEMENT  12/18/2011   Procedure: ARTERIOVENOUS (AV) FISTULA CREATION;  Surgeon: Angelia Mould, MD;  Location: [facility];  Service: Vascular;  Laterality:  Left;  . INSERTION OF DIALYSIS CATHETER Right 08/06/2014   Procedure: INSERTION OF DIALYSIS CATHETER;  Surgeon: Elam Dutch, MD;  Location: [facility];  Service: Vascular;  Laterality: Right;  . RENAL BIOPSY    . REVISON OF ARTERIOVENOUS FISTULA Left 5/85/2778   Procedure: PLICATION OF ARTERIOVENOUS FISTULA;  Surgeon: Elam Dutch, MD;  Location: [facility];  Service: Vascular;  Laterality: Left;  . REVISON OF ARTERIOVENOUS FISTULA Left 03/12/2017   Procedure: REVISION PLICATION OF ARTERIOVENOUS FISTULA LEFT UPPER ARM;  Surgeon: Serafina Mitchell, MD;  [facility];  Service: Vascular;  Laterality: Left;    FAMILY HISTORY: Family History  Problem Relation  Age of Onset  . Hypertension Mother   . Diabetes Father   . Hyperlipidemia Father     SOCIAL HISTORY: Social History   Socioeconomic History  . Marital status: Single    Spouse name: Not on file  . Number of children: 0  . Years of education: college jr  . Highest education level: Not on file  Occupational History  . Not on file  Social Needs  . Financial resource strain: Not on file  . Food insecurity:    Worry: Not on file    Inability: Not on file  . Transportation needs:    Medical: Not on file    Non-medical: Not on file  Tobacco Use  . Smoking status: Never Smoker  . Smokeless tobacco: Never Used  Substance and Sexual Activity  . Alcohol use: No    Alcohol/week: 0.0 standard drinks  . Drug use: No  . Sexual activity: Never    Birth control/protection: Abstinence  Lifestyle  . Physical activity:    Days per week: Not on file    Minutes per session: Not on file  . Stress: Not on file  Relationships  . Social connections:    Talks on phone: Not on file    Gets together: Not on file    Attends religious service: Not on file    Active member of club or organization: Not on file    Attends meetings of clubs or organizations: Not on file    Relationship status: Not on file  . Intimate partner violence:    Fear of current or ex partner: Not on file    Emotionally abused: Not on file    Physically abused: Not on file    Forced sexual activity: Not on file  Other Topics Concern  . Not on file  Social History Narrative   Lives at home with her mother & brother   Right handed      ** Merged History Encounter **          PHYSICAL EXAM  Vitals:   05/23/18 0800  BP: 131/83  Pulse: 88  Weight: 120 lb (54.4 kg)  Height: 5\' 2"  (1.575 m)   Body mass index is 21.95 kg/m.  Generalized: Well developed, in no acute distress  Using a walker Neurological examination  Mentation: Alert oriented to time, place, history taking. Follows all commands speech and  language fluent Cranial nerve II-XII: Pupils were equal round reactive to light. Extraocular movements were full, visual field were full on confrontational test. Facial sensation and strength were normal. Uvula tongue midline. Head turning and shoulder shrug  were normal and symmetric. Motor: The motor testing reveals 5 over 5 strength of all 4 extremities. Good symmetric motor tone is noted throughout.  Sensory: Sensory testing is intact to soft touch on all 4  extremities. No evidence of extinction is noted.  Coordination: Cerebellar testing reveals good finger-nose-finger and heel-to-shin bilaterally.  Gait and station: Gait is wide-based, mildly unsteady, using a walker.  She has a fairly fast speed for ambulation. Reflexes: Deep tendon reflexes are symmetric and normal bilaterally.   DIAGNOSTIC DATA (LABS, IMAGING, TESTING) - I reviewed patient records, labs, notes, testing and imaging myself where available.  Lab Results  Component Value Date   WBC 6.6 12/18/2011   HGB 13.3 03/12/2017   HCT 39.0 03/12/2017   MCV 90.9 12/18/2011   PLT 244 12/18/2011      Component Value Date/Time   NA 137 03/12/2017 1024   K 4.8 03/12/2017 1024   CL 99 12/18/2011 0505   CO2 27 12/18/2011 0505   GLUCOSE 80 03/12/2017 1024   BUN 17 12/18/2011 0505   CREATININE 2.30 (H) 12/18/2011 0505   CALCIUM 9.4 12/18/2011 0505   CALCIUM 8.4 10/28/2011 1327   PROT 6.1 11/29/2011 0224   ALBUMIN 2.4 (L) 12/18/2011 0505   AST 56 (H) 11/29/2011 0224   ALT 6 12/01/2011 2330   ALKPHOS 62 11/29/2011 0224   BILITOT 0.4 11/29/2011 0224   GFRNONAA 29 (L) 12/18/2011 0505   GFRAA 34 (L) 12/18/2011 0505   Lab Results  Component Value Date   CHOL 296 (H) 03/26/2011   HDL 54 03/26/2011   LDLCALC 213 (H) 03/26/2011   TRIG 146 03/26/2011   CHOLHDL 5.5 03/26/2011   No results found for: HGBA1C Lab Results  Component Value Date   VITAMINB12 1,354 (H) 11/28/2011   Lab Results  Component Value Date   TSH 7.226  (H) 09/12/2011      ASSESSMENT AND PLAN 28 y.o. year old female  has a past medical history of Anemia, Brain injury ([place]), Cardiac arrest ([place]) (03/26/2011), Cardiomyopathy, CHF (congestive heart failure) ([place]), Chronic kidney disease, Depression, Family history of adverse reaction to anesthesia, Gait disorder (01/11/2013), Heart failure, History of blood transfusion (11/2011), Hypertension, Ileitis, MPGN (membranoproliferative glomerulonephritis), type 2, Pneumonia, Renal disorder, and Seizures ([place]). here with:  1.  Seizures  Overall she is doing quite well and she reports she has not had a seizure in over 5 years.  She will continue taking Keppra 250 mg twice daily.  She is currently on hemodialysis and is awaiting a kidney transplant.  A refill for her Keppra was sent in today.  She will follow-up in this office in 1 year or sooner if needed.    I spent 15 minutes with the patient. 50% of this time was spent Discussing her plan of care.    Butler Denmark, AGNP-C, DNP 05/23/2018, 8:17 AM [facility] [address] [place], [place] [address] [phone]

## 2018-05-23 ENCOUNTER — Ambulatory Visit: Payer: Medicaid Other | Admitting: Neurology

## 2018-05-23 ENCOUNTER — Encounter: Payer: Self-pay | Admitting: Neurology

## 2018-05-23 VITALS — BP 131/83 | HR 88 | Ht 62.0 in | Wt 120.0 lb

## 2018-05-23 DIAGNOSIS — G40409 Other generalized epilepsy and epileptic syndromes, not intractable, without status epilepticus: Secondary | ICD-10-CM

## 2018-05-23 MED ORDER — LEVETIRACETAM 250 MG PO TABS: ORAL_TABLET | ORAL | 2 refills | Status: DC

## 2018-05-23 NOTE — Progress Notes (Signed)
I have read the note, and I agree with the clinical assessment and plan.  Heather Sims   

## 2018-05-24 ENCOUNTER — Encounter
Admit: 2018-05-24 | Discharge: 2018-05-24 | Payer: PRIVATE HEALTH INSURANCE | Attending: Nephrology | Primary: Nephrology

## 2018-05-27 DIAGNOSIS — Z01818 Encounter for other preprocedural examination: Principal | ICD-10-CM

## 2018-05-27 DIAGNOSIS — Z7682 Awaiting organ transplant status: Principal | ICD-10-CM

## 2018-05-27 DIAGNOSIS — N186 End stage renal disease: Principal | ICD-10-CM

## 2018-06-12 DIAGNOSIS — Z992 Dependence on renal dialysis: Principal | ICD-10-CM

## 2018-06-12 DIAGNOSIS — N186 End stage renal disease: Principal | ICD-10-CM

## 2018-06-12 DIAGNOSIS — Z7682 Awaiting organ transplant status: Principal | ICD-10-CM

## 2018-06-28 ENCOUNTER — Encounter
Admit: 2018-06-28 | Discharge: 2018-06-28 | Payer: PRIVATE HEALTH INSURANCE | Attending: Nephrology | Primary: Nephrology

## 2018-07-05 DIAGNOSIS — Z01818 Encounter for other preprocedural examination: Secondary | ICD-10-CM

## 2018-07-05 DIAGNOSIS — N186 End stage renal disease: Secondary | ICD-10-CM

## 2018-07-05 DIAGNOSIS — Z7682 Awaiting organ transplant status: Principal | ICD-10-CM

## 2018-07-26 ENCOUNTER — Encounter
Admit: 2018-07-26 | Discharge: 2018-07-26 | Payer: PRIVATE HEALTH INSURANCE | Attending: Nephrology | Primary: Nephrology

## 2018-08-01 DIAGNOSIS — Z7682 Awaiting organ transplant status: Principal | ICD-10-CM

## 2018-08-01 DIAGNOSIS — N186 End stage renal disease: Secondary | ICD-10-CM

## 2018-08-01 DIAGNOSIS — Z01818 Encounter for other preprocedural examination: Secondary | ICD-10-CM

## 2018-08-23 ENCOUNTER — Encounter
Admit: 2018-08-23 | Discharge: 2018-08-23 | Payer: PRIVATE HEALTH INSURANCE | Attending: Nephrology | Primary: Nephrology

## 2018-08-24 DIAGNOSIS — Z01818 Encounter for other preprocedural examination: Secondary | ICD-10-CM

## 2018-08-24 DIAGNOSIS — N186 End stage renal disease: Secondary | ICD-10-CM

## 2018-08-24 DIAGNOSIS — Z7682 Awaiting organ transplant status: Principal | ICD-10-CM

## 2018-09-12 ENCOUNTER — Encounter
Admit: 2018-09-12 | Discharge: 2018-09-12 | Payer: PRIVATE HEALTH INSURANCE | Attending: Student in an Organized Health Care Education/Training Program | Primary: Student in an Organized Health Care Education/Training Program

## 2018-09-12 DIAGNOSIS — Z7682 Awaiting organ transplant status: Principal | ICD-10-CM

## 2018-09-27 ENCOUNTER — Encounter
Admit: 2018-09-27 | Discharge: 2018-09-27 | Payer: PRIVATE HEALTH INSURANCE | Attending: Nephrology | Primary: Nephrology

## 2018-09-30 DIAGNOSIS — Z7682 Awaiting organ transplant status: Principal | ICD-10-CM

## 2018-09-30 DIAGNOSIS — Z01818 Encounter for other preprocedural examination: Secondary | ICD-10-CM

## 2018-09-30 DIAGNOSIS — N186 End stage renal disease: Secondary | ICD-10-CM

## 2018-10-07 ENCOUNTER — Encounter: Admit: 2018-10-07 | Discharge: 2018-10-07 | Payer: PRIVATE HEALTH INSURANCE | Attending: Surgery | Primary: Surgery

## 2018-10-07 DIAGNOSIS — Z7682 Awaiting organ transplant status: Secondary | ICD-10-CM

## 2018-10-07 DIAGNOSIS — Z01818 Encounter for other preprocedural examination: Principal | ICD-10-CM

## 2018-10-19 ENCOUNTER — Encounter: Payer: Medicaid Other | Attending: Physical Medicine & Rehabilitation | Admitting: Physical Medicine & Rehabilitation

## 2018-10-19 ENCOUNTER — Encounter: Payer: Self-pay | Admitting: Physical Medicine & Rehabilitation

## 2018-10-19 ENCOUNTER — Other Ambulatory Visit: Payer: Self-pay

## 2018-10-19 DIAGNOSIS — I429 Cardiomyopathy, unspecified: Secondary | ICD-10-CM | POA: Insufficient documentation

## 2018-10-19 DIAGNOSIS — I130 Hypertensive heart and chronic kidney disease with heart failure and stage 1 through stage 4 chronic kidney disease, or unspecified chronic kidney disease: Secondary | ICD-10-CM | POA: Insufficient documentation

## 2018-10-19 DIAGNOSIS — F329 Major depressive disorder, single episode, unspecified: Secondary | ICD-10-CM | POA: Insufficient documentation

## 2018-10-19 DIAGNOSIS — N189 Chronic kidney disease, unspecified: Secondary | ICD-10-CM | POA: Diagnosis not present

## 2018-10-19 DIAGNOSIS — I469 Cardiac arrest, cause unspecified: Secondary | ICD-10-CM | POA: Diagnosis not present

## 2018-10-19 DIAGNOSIS — D696 Thrombocytopenia, unspecified: Secondary | ICD-10-CM | POA: Insufficient documentation

## 2018-10-19 DIAGNOSIS — R2689 Other abnormalities of gait and mobility: Secondary | ICD-10-CM | POA: Diagnosis present

## 2018-10-19 DIAGNOSIS — G253 Myoclonus: Secondary | ICD-10-CM | POA: Diagnosis not present

## 2018-10-19 DIAGNOSIS — Z8249 Family history of ischemic heart disease and other diseases of the circulatory system: Secondary | ICD-10-CM | POA: Insufficient documentation

## 2018-10-19 DIAGNOSIS — I509 Heart failure, unspecified: Secondary | ICD-10-CM | POA: Insufficient documentation

## 2018-10-19 DIAGNOSIS — D649 Anemia, unspecified: Secondary | ICD-10-CM | POA: Diagnosis not present

## 2018-10-19 DIAGNOSIS — G931 Anoxic brain damage, not elsewhere classified: Secondary | ICD-10-CM | POA: Insufficient documentation

## 2018-10-19 DIAGNOSIS — G40909 Epilepsy, unspecified, not intractable, without status epilepticus: Secondary | ICD-10-CM | POA: Insufficient documentation

## 2018-10-19 MED ORDER — CLONAZEPAM 0.5 MG PO TABS: 0.5000 mg | ORAL_TABLET | Freq: Two times a day (BID) | ORAL | 3 refills | Status: DC

## 2018-10-19 NOTE — Patient Instructions (Signed)
IF YOU'RE PRIMARY CARE DOCTOR IS WILLING TO FILL YOUR KLONOPIN LET us KNOW AND CALL TO CANCEL YOUR FOLLOW UP APPOINTMENT. OTHERWISE WE'LL SEE YOU IN A YEAR.

## 2018-10-19 NOTE — Progress Notes (Signed)
Subjective:    Patient ID: Heather Sims, female    DOB: 02-Jan-1991, 28 y.o.   MRN: [id number]  HPI   [name] is here in follow up of her clonus. She is still using her walker at home, cane at home. She denies any falls or mishaps. Pain is minimal. She reports no new health problems. She remains on HD at [facility].  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   Pain Inventory Average Pain 0 Pain Right Now 0 My pain is no pain  In the last 24 hours, has pain interfered with the following? General activity 0 Relation with others 0 Enjoyment of life 0 What TIME of day is your pain at its worst? no pain Sleep (in general) Good  Pain is worse with: no pain Pain improves with: no pain Relief from Meds: no pain  Mobility ability to climb steps?  yes do you drive?  no  Function Do you have any goals in this area?  no  Neuro/Psych tremor  Prior Studies Any changes since last visit?  no  Physicians involved in your care Any changes since last visit?  no   Family History  Problem Relation Age of Onset  . Hypertension Mother   . Diabetes Father   . Hyperlipidemia Father    Social History   Socioeconomic History  . Marital status: Single    Spouse name: Not on file  . Number of children: 0  . Years of education: college jr  . Highest education level: Not on file  Occupational History  . Not on file  Social Needs  . Financial resource strain: Not on file  . Food insecurity    Worry: Not on file    Inability: Not on file  . Transportation needs    Medical: Not on file    Non-medical: Not on file  Tobacco Use  . Smoking status: Never Smoker  . Smokeless tobacco: Never Used  Substance and Sexual Activity  . Alcohol use: No    Alcohol/week: 0.0 standard drinks  . Drug use: No  . Sexual activity: Never    Birth control/protection: Abstinence  Lifestyle  . Physical activity    Days per week: Not on file   Minutes per session: Not on file  . Stress: Not on file  Relationships  . Social Herbalist on phone: Not on file    Gets together: Not on file    Attends religious service: Not on file    Active member of club or organization: Not on file    Attends meetings of clubs or organizations: Not on file    Relationship status: Not on file  Other Topics Concern  . Not on file  Social History Narrative   Lives at home with her mother & brother   Right handed      ** Merged History Encounter **       Past Surgical History:  Procedure Laterality Date  . AV FISTULA PLACEMENT  12/18/2011   Procedure: ARTERIOVENOUS (AV) FISTULA CREATION;  Surgeon: Angelia Mould, MD;  Location: [place];  Service: Vascular;  Laterality: Left;  . INSERTION OF DIALYSIS CATHETER Right 08/06/2014   Procedure: INSERTION OF DIALYSIS CATHETER;  Surgeon: Elam Dutch, MD;  Location: [place];  Service: Vascular;  Laterality: Right;  . RENAL BIOPSY    . REVISON OF ARTERIOVENOUS FISTULA Left 7/42/5956   Procedure: PLICATION OF  ARTERIOVENOUS FISTULA;  Surgeon: Elam Dutch, MD;  Location: [place];  Service: Vascular;  Laterality: Left;  . REVISON OF ARTERIOVENOUS FISTULA Left 03/12/2017   Procedure: REVISION PLICATION OF ARTERIOVENOUS FISTULA LEFT UPPER ARM;  Surgeon: Serafina Mitchell, MD;  Location[facility];  Service: Vascular;  Laterality: Left;   Past Medical History:  Diagnosis Date  . Anemia   . Brain injury ([place])   . Cardiac arrest ([place]) 03/26/2011  . Cardiomyopathy   . CHF (congestive heart failure) ([place])   . Chronic kidney disease    TThS- [place]  . Depression   . Family history of adverse reaction to anesthesia    Mother hard to wake up.  . Gait disorder 01/11/2013  . Heart failure   . History of blood transfusion 11/2011  . Hypertension   . Ileitis   . MPGN (membranoproliferative glomerulonephritis), type 2   . Pneumonia    'walking pneumonia'  . Renal disorder   . Seizures ([place])     2013- last time   BP 100/68   Pulse 88   Temp 98.6 F (37 C)   Ht 5\' 2"  (1.575 m)   Wt 115 lb 9.6 oz (52.4 kg)   SpO2 98%   BMI 21.14 kg/m   Opioid Risk Score:   Fall Risk Score:  `1  Depression screen PHQ 2/9  Depression screen [facility] 2/9 10/18/2017 07/02/2014  Decreased Interest 1 1  Down, Depressed, Hopeless 1 1  PHQ - 2 Score 2 2  Altered sleeping - 1  Tired, decreased energy - 1  Change in appetite - 0  Feeling bad or failure about yourself  - 2  Trouble concentrating - 0  Moving slowly or fidgety/restless - 0  Suicidal thoughts - 0  PHQ-9 Score - 6    Review of Systems  Constitutional: Negative.   HENT: Negative.   Eyes: Negative.   Respiratory: Negative.   Cardiovascular: Negative.   Gastrointestinal: Negative.   Endocrine: Negative.   Genitourinary: Negative.   Musculoskeletal: Negative.   Skin: Negative.   Allergic/Immunologic: Negative.   Neurological: Positive for tremors.  Hematological: Negative.   Psychiatric/Behavioral: Negative.   All other systems reviewed and are negative.      Objective:   Physical Exam General: No acute distress HEENT: EOMI, oral membranes moist Cards: reg rate  Chest: normal effort Abdomen: Soft, NT, ND Skin: dry, intact Extremities: no edema  UE: 5/5 and LE's.  GAIT Very fluid with walkers. Still some steppage and recurvatum right knee/leg. Much more pronounced with out AD.  Psychiatric:REMAINS FLAT  Assessment & Plan:  1. 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 Myoclonus.  3. Seizure disorder  4. Chronic renal failure  5. Hypertension. Substantial elevation today  6. Anemia.  7. Thrombocytopenia - improved.  8. Mood -much improved    Plan:  1.continue with safety and HEP.  2. Maintain klonopin to 0.5mg  at bid for myoclonus. Continue to take after HD and before bed on HD days. RF today  We will continue the controlled substance monitoring program, this consists of regular clinic  visits, examinations, routine drug screening, pill counts as well as use of [place] Controlled Substance Reporting System. [place] today.   3. Continue with keppra for seizure prophylaxis  4.  Follow up with me PRN if primary is willing to fill klonopin, otherwise with NP in about 12 months. 10 minutes of face to face patient care time were spent during this  visit. All questions were encouraged and answered

## 2018-10-25 ENCOUNTER — Encounter
Admit: 2018-10-25 | Discharge: 2018-10-25 | Payer: PRIVATE HEALTH INSURANCE | Attending: Nephrology | Primary: Nephrology

## 2018-10-27 DIAGNOSIS — Z7682 Awaiting organ transplant status: Principal | ICD-10-CM

## 2018-10-27 DIAGNOSIS — N186 End stage renal disease: Secondary | ICD-10-CM

## 2018-10-27 DIAGNOSIS — Z01818 Encounter for other preprocedural examination: Secondary | ICD-10-CM

## 2018-11-05 ENCOUNTER — Encounter: Admit: 2018-11-05 | Discharge: 2018-11-05 | Payer: PRIVATE HEALTH INSURANCE | Attending: Surgery | Primary: Surgery

## 2018-11-05 DIAGNOSIS — Z7682 Awaiting organ transplant status: Principal | ICD-10-CM

## 2018-11-28 DIAGNOSIS — Z7682 Awaiting organ transplant status: Secondary | ICD-10-CM

## 2018-11-28 DIAGNOSIS — F329 Major depressive disorder, single episode, unspecified: Secondary | ICD-10-CM

## 2018-11-28 DIAGNOSIS — Z992 Dependence on renal dialysis: Secondary | ICD-10-CM

## 2018-11-28 DIAGNOSIS — G43909 Migraine, unspecified, not intractable, without status migrainosus: Secondary | ICD-10-CM

## 2018-11-28 DIAGNOSIS — F419 Anxiety disorder, unspecified: Secondary | ICD-10-CM

## 2018-11-28 DIAGNOSIS — R87610 Atypical squamous cells of undetermined significance on cytologic smear of cervix (ASC-US): Secondary | ICD-10-CM

## 2018-11-28 DIAGNOSIS — Z20828 Contact with and (suspected) exposure to other viral communicable diseases: Secondary | ICD-10-CM

## 2018-11-28 DIAGNOSIS — Z79899 Other long term (current) drug therapy: Secondary | ICD-10-CM

## 2018-11-28 DIAGNOSIS — N186 End stage renal disease: Secondary | ICD-10-CM

## 2018-11-28 DIAGNOSIS — N032 Chronic nephritic syndrome with diffuse membranous glomerulonephritis: Secondary | ICD-10-CM

## 2018-11-28 DIAGNOSIS — D631 Anemia in chronic kidney disease: Secondary | ICD-10-CM

## 2018-11-29 ENCOUNTER — Encounter: Admit: 2018-11-29 | Discharge: 2018-11-30 | Payer: PRIVATE HEALTH INSURANCE

## 2018-11-29 DIAGNOSIS — F419 Anxiety disorder, unspecified: Secondary | ICD-10-CM

## 2018-11-29 DIAGNOSIS — G43909 Migraine, unspecified, not intractable, without status migrainosus: Secondary | ICD-10-CM

## 2018-11-29 DIAGNOSIS — N032 Chronic nephritic syndrome with diffuse membranous glomerulonephritis: Secondary | ICD-10-CM

## 2018-11-29 DIAGNOSIS — Z7682 Awaiting organ transplant status: Secondary | ICD-10-CM

## 2018-11-29 DIAGNOSIS — D631 Anemia in chronic kidney disease: Secondary | ICD-10-CM

## 2018-11-29 DIAGNOSIS — F329 Major depressive disorder, single episode, unspecified: Secondary | ICD-10-CM

## 2018-11-29 DIAGNOSIS — Z79899 Other long term (current) drug therapy: Secondary | ICD-10-CM

## 2018-11-29 DIAGNOSIS — Z992 Dependence on renal dialysis: Secondary | ICD-10-CM

## 2018-11-29 DIAGNOSIS — N186 End stage renal disease: Secondary | ICD-10-CM

## 2018-11-29 DIAGNOSIS — Z20828 Contact with and (suspected) exposure to other viral communicable diseases: Secondary | ICD-10-CM

## 2018-12-06 ENCOUNTER — Encounter
Admit: 2018-12-06 | Discharge: 2018-12-06 | Payer: PRIVATE HEALTH INSURANCE | Attending: Nephrology | Primary: Nephrology

## 2018-12-12 DIAGNOSIS — Z01818 Encounter for other preprocedural examination: Secondary | ICD-10-CM

## 2018-12-12 DIAGNOSIS — N186 End stage renal disease: Secondary | ICD-10-CM

## 2018-12-12 DIAGNOSIS — Z7682 Awaiting organ transplant status: Secondary | ICD-10-CM

## 2018-12-27 ENCOUNTER — Encounter
Admit: 2018-12-27 | Discharge: 2018-12-27 | Payer: PRIVATE HEALTH INSURANCE | Attending: Nephrology | Primary: Nephrology

## 2018-12-29 DIAGNOSIS — Z7682 Awaiting organ transplant status: Secondary | ICD-10-CM

## 2018-12-29 DIAGNOSIS — Z01818 Encounter for other preprocedural examination: Secondary | ICD-10-CM

## 2018-12-29 DIAGNOSIS — N186 End stage renal disease: Secondary | ICD-10-CM

## 2019-01-19 DIAGNOSIS — Z01818 Encounter for other preprocedural examination: Principal | ICD-10-CM

## 2019-01-20 ENCOUNTER — Encounter
Admit: 2019-01-20 | Discharge: 2019-01-20 | Payer: PRIVATE HEALTH INSURANCE | Attending: Nephrology | Primary: Nephrology

## 2019-01-24 ENCOUNTER — Encounter
Admit: 2019-01-24 | Discharge: 2019-01-24 | Payer: PRIVATE HEALTH INSURANCE | Attending: Nephrology | Primary: Nephrology

## 2019-01-26 DIAGNOSIS — N186 End stage renal disease: Principal | ICD-10-CM

## 2019-01-26 DIAGNOSIS — Z01818 Encounter for other preprocedural examination: Principal | ICD-10-CM

## 2019-01-26 DIAGNOSIS — Z7682 Awaiting organ transplant status: Principal | ICD-10-CM

## 2019-02-07 ENCOUNTER — Encounter
Admit: 2019-02-07 | Discharge: 2019-02-07 | Payer: PRIVATE HEALTH INSURANCE | Attending: Student in an Organized Health Care Education/Training Program | Primary: Student in an Organized Health Care Education/Training Program

## 2019-02-07 DIAGNOSIS — Z7682 Awaiting organ transplant status: Principal | ICD-10-CM

## 2019-02-11 ENCOUNTER — Encounter
Admit: 2019-02-11 | Discharge: 2019-02-11 | Payer: PRIVATE HEALTH INSURANCE | Attending: Student in an Organized Health Care Education/Training Program | Primary: Student in an Organized Health Care Education/Training Program

## 2019-02-11 DIAGNOSIS — N186 End stage renal disease: Principal | ICD-10-CM

## 2019-02-17 ENCOUNTER — Encounter
Admit: 2019-02-17 | Discharge: 2019-02-17 | Payer: PRIVATE HEALTH INSURANCE | Attending: Student in an Organized Health Care Education/Training Program | Primary: Student in an Organized Health Care Education/Training Program

## 2019-02-17 ENCOUNTER — Encounter: Admit: 2019-02-17 | Discharge: 2019-02-17 | Payer: PRIVATE HEALTH INSURANCE | Attending: Surgery | Primary: Surgery

## 2019-02-17 DIAGNOSIS — Z7682 Awaiting organ transplant status: Principal | ICD-10-CM

## 2019-02-18 ENCOUNTER — Encounter
Admit: 2019-02-18 | Discharge: 2019-02-18 | Disposition: A | Discharge status: Home / Self Care | Payer: PRIVATE HEALTH INSURANCE | Attending: Student in an Organized Health Care Education/Training Program

## 2019-02-27 ENCOUNTER — Encounter
Admit: 2019-02-27 | Discharge: 2019-02-27 | Payer: PRIVATE HEALTH INSURANCE | Attending: Student in an Organized Health Care Education/Training Program | Primary: Student in an Organized Health Care Education/Training Program

## 2019-02-27 DIAGNOSIS — N186 End stage renal disease: Principal | ICD-10-CM

## 2019-02-27 DIAGNOSIS — Z01818 Encounter for other preprocedural examination: Principal | ICD-10-CM

## 2019-02-28 ENCOUNTER — Encounter
Admit: 2019-02-28 | Discharge: 2019-02-28 | Payer: PRIVATE HEALTH INSURANCE | Attending: Nephrology | Primary: Nephrology

## 2019-03-06 DIAGNOSIS — Z01818 Encounter for other preprocedural examination: Principal | ICD-10-CM

## 2019-03-06 DIAGNOSIS — Z7682 Awaiting organ transplant status: Principal | ICD-10-CM

## 2019-03-06 DIAGNOSIS — N186 End stage renal disease: Principal | ICD-10-CM

## 2019-03-07 ENCOUNTER — Encounter
Admit: 2019-03-07 | Discharge: 2019-03-07 | Payer: PRIVATE HEALTH INSURANCE | Attending: Clinical Pathology/Laboratory Medicine | Primary: Clinical Pathology/Laboratory Medicine

## 2019-03-08 ENCOUNTER — Encounter: Admit: 2019-03-08 | Discharge: 2019-03-08 | Payer: PRIVATE HEALTH INSURANCE | Attending: Surgery | Primary: Surgery

## 2019-03-08 DIAGNOSIS — Z7682 Awaiting organ transplant status: Principal | ICD-10-CM

## 2019-03-08 DIAGNOSIS — N186 End stage renal disease: Principal | ICD-10-CM

## 2019-03-09 ENCOUNTER — Ambulatory Visit
Admit: 2019-03-09 | Discharge: 2019-03-13 | Disposition: A | Discharge status: Home Health | Payer: PRIVATE HEALTH INSURANCE | Admitting: Surgery

## 2019-03-09 ENCOUNTER — Encounter
Admit: 2019-03-09 | Discharge: 2019-03-13 | Disposition: A | Discharge status: Home Health | Payer: PRIVATE HEALTH INSURANCE | Attending: Student in an Organized Health Care Education/Training Program | Admitting: Surgery

## 2019-03-10 DIAGNOSIS — Z529 Donor of unspecified organ or tissue: Principal | ICD-10-CM

## 2019-03-10 NOTE — Unmapped (Addendum)
Transplant Event    Conversation with: Epic   Primary Insurance Co[place] with pharmacy benefits provided by Medicaid.    [place] Event Authorization No (Primary): NR    TFC researched and patient did not have AKF 3 months prior to Transplant. Patient is not eligible for AKF post Transplant.

## 2019-03-11 NOTE — Unmapped (Signed)
TRANSPLANT SURGERY PROGRESS NOTE    Admit Date: 03/09/2019, Hospital Day: 3  Hospital Service: Surg Transplant [facility])  Attending: Leona Carry*    Assessment     28 y.o. 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     Interval Events:  Discontinued 1:1 replacements. Started diet. Transferred to floor status.     Plan     Neuro/Pain:   - Pain well controlled on current regimen  - Continue home Keppra  Cardio:   - HDS  Pulmonary:   - SORA. OOB/IS.  FEN/GI:   -F: Discontinue 1:1 replacements. Start normal saline at 75 mL/hr  -N: CLD. ADAT. Bowel regimen.   GU/Renal:   *DDKT on 12/18: UOP appropriate. Creatinine down trending.   Heme/ID:   - Immunosuppression: Induced with Campath. Continue Prograf, cellcept, steroid taper.   Endo:  - Continue SSI  Prophylaxis: Valcyte, Bactrim, Nystatin.  Disposition: Floor status.     Please page SRF [phone] with questions or concerns.      Subjective     No acute events since arrival to SICU. Quickly transferred to step down status. Denies pain this morning. Denies N/V.     Objective     Vitals:   Temp:  [36.8 ??C-37.1 ??C] 37 ??C  Heart Rate:  [91-111] 101  SpO2 Pulse:  [90-110] 103  Resp:  [17-30] 18  BP: (89-171)/(38-114) 128/88  MAP (mmHg):  [52-131] 97  SpO2:  [95 %-99 %] 98 %  BMI (Calculated):  [20.52] 20.52    Intake/Output last 24 hours:  I/O       12/17 0701 - 12/18 0700 12/18 0701 - 12/19 0700 12/19 0701 - 12/20 0700    P.O.  75 120    I.V. (mL/kg)  5115 (92) 200 (3.6)    IV Piggyback  1315.8     Total Intake  6505.8 320    Urine (mL/kg/hr)  900 (0.7) 275 (0.8)    Drains  250 15    Other 2500      Blood  80     Total Output(mL/kg) 2500 1230 (22.1) 290 (5.2)    Net -2500 +5275.8 +30                 Physical Exam:    -General:  Appropriate, comfortable and in no apparent distress.   -Neurological: Alert and oriented x3. Moves all 4 extremities spontaneously.   -Cardiovascular: Regular rate. -Pulmonary: Normal work of breathing. No accessory muscle use.  -Abdomen: Soft, non-tender, non-distended. No rebound or guarding. Drain with serosanguinous output. Incision benign.     Data Review:  Lab Results   Component Value Date    WBC 16.5 (H) 03/11/2019    HGB 10.1 (L) 03/11/2019    HCT 32.3 (L) 03/11/2019    PLT 145 (L) 03/11/2019       Lab Results   Component Value Date    NA 135 03/11/2019    K 5.3 (H) 03/11/2019    CL 103 03/11/2019    CO2 23.0 03/11/2019    BUN 21 03/11/2019    CREATININE 3.37 (H) 03/11/2019    GLU 85 03/11/2019    CALCIUM 9.5 03/11/2019    MG 2.2 03/11/2019    PHOS 5.3 (H) 03/11/2019       Lab Results   Component Value Date    BILITOT 0.6 03/09/2019    BILIDIR 0.70 (H) 01/18/2017  PROT 6.9 03/09/2019    ALBUMIN 3.7 03/09/2019    ALT 6 03/09/2019    AST 18 03/09/2019    ALKPHOS 88 03/09/2019    GGT <10 (L) 03/09/2019       Lab Results   Component Value Date    INR 0.96 03/09/2019    APTT 31.0 03/09/2019

## 2019-03-12 NOTE — Unmapped (Signed)
TRANSPLANT SURGERY PROGRESS NOTE    Admit Date: 03/09/2019, Hospital Day: 4  Hospital Service: Surg Transplant [facility])  Attending: Leona Carry*    Assessment     28 y.o. 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       Plan     Neuro/Pain:   - Pain well controlled on current regimen  - Continue home Keppra  Cardio:   - HDS  Pulmonary:   - SORA. OOB/IS.  FEN/GI:   -F: ML  -N: Regular diet. Bowel regimen.    GU/Renal:   *DDKT on 12/18: UOP appropriate. Creatinine down trending. Foley to remain in place for 3 days.   Heme/ID:   - Immunosuppression: Induced with Campath. Continue Prograf, cellcept, steroid taper.   Endo:  - Continue SSI  Prophylaxis: Valcyte, Bactrim, Nystatin.  Disposition: Floor status.     Please page SRF [phone] with questions or concerns.      Subjective     NAEON.     Objective     Vitals:   Temp:  [36.2 ??C-36.9 ??C] 36.3 ??C  Heart Rate:  [94-108] 99  Resp:  [16-24] 16  BP: (119-155)/(76-99) 155/98  MAP (mmHg):  [91-120] 120  SpO2:  [95 %-100 %] 100 %    Intake/Output last 24 hours:  I/O       12/18 0701 - 12/19 0700 12/19 0701 - 12/20 0700 12/20 0701 - 12/21 0700    P.O. 75 660 240    I.V. (mL/kg) 5115 (92) 500 (9)     IV Piggyback 1315.8      Total Intake 6505.8 1160 240    Urine (mL/kg/hr) 900 (0.7) 850 (0.6) 105 (0.3)    Drains 250 75 25    Other       Blood 80      Total Output(mL/kg) 1230 (22.1) 925 (16.6) 130 (2.3)    Net +5275.8 +235 +110                 Physical Exam:    -General:  Appropriate, comfortable and in no apparent distress.   -Neurological: Alert and oriented x3. Moves all 4 extremities spontaneously.   -Cardiovascular: Regular rate.  -Pulmonary: Normal work of breathing. No accessory muscle use.  -Abdomen: Soft, non-tender, non-distended. No rebound or guarding. Drain with serosanguinous output. Incision benign.     Data Review:  Lab Results   Component Value Date    WBC 14.1 (H) 03/12/2019 HGB 10.1 (L) 03/12/2019    HCT 32.1 (L) 03/12/2019    PLT 147 (L) 03/12/2019       Lab Results   Component Value Date    NA 135 03/12/2019    K 5.1 (H) 03/12/2019    CL 103 03/12/2019    CO2 25.0 03/12/2019    BUN 28 (H) 03/12/2019    CREATININE 2.05 (H) 03/12/2019    GLU 100 03/12/2019    CALCIUM 9.4 03/12/2019    MG 2.3 (H) 03/12/2019    PHOS 4.8 (H) 03/12/2019       Lab Results   Component Value Date    BILITOT 0.6 03/09/2019    BILIDIR 0.70 (H) 01/18/2017    PROT 6.9 03/09/2019    ALBUMIN 3.7 03/09/2019    ALT 6 03/09/2019    AST 18 03/09/2019    ALKPHOS 88 03/09/2019    GGT <10 (L) 03/09/2019  Lab Results   Component Value Date    INR 0.96 03/09/2019    APTT 31.0 03/09/2019

## 2019-03-13 DIAGNOSIS — Z940 Kidney transplant status: Principal | ICD-10-CM

## 2019-03-13 DIAGNOSIS — Z79899 Other long term (current) drug therapy: Principal | ICD-10-CM

## 2019-03-13 DIAGNOSIS — D899 Disorder involving the immune mechanism, unspecified: Principal | ICD-10-CM

## 2019-03-13 MED ORDER — ACETAMINOPHEN 500 MG TABLET
ORAL_TABLET | Freq: Four times a day (QID) | ORAL | 0 refills | 13 days | Status: CP | PRN
Start: 2019-03-13 — End: ?
  Filled 2019-03-13: qty 100, 12d supply, fill #0
  Filled 2019-03-13: qty 30, 30d supply, fill #0

## 2019-03-13 MED ORDER — POLYETHYLENE GLYCOL 3350 17 GRAM ORAL POWDER PACKET
Freq: Every day | ORAL | 0 refills | 30 days | Status: CP | PRN
Start: 2019-03-13 — End: ?
  Filled 2019-03-13: qty 30, 30d supply, fill #0

## 2019-03-13 MED ORDER — MG-PLUS-PROTEIN 133 MG TABLET
ORAL_TABLET | Freq: Two times a day (BID) | ORAL | 11 refills | 0 days | Status: CP
Start: 2019-03-13 — End: ?
  Filled 2019-03-13: qty 100, 50d supply, fill #0

## 2019-03-13 MED ORDER — TACROLIMUS ER 1 MG TABLET,EXTENDED RELEASE 24 HR
ORAL_TABLET | Freq: Every day | ORAL | 11 refills | 30.00000 days | Status: CP
Start: 2019-03-13 — End: 2020-03-12
  Filled 2019-03-13: qty 90, 30d supply, fill #0

## 2019-03-13 MED ORDER — VALGANCICLOVIR 450 MG TABLET
ORAL_TABLET | Freq: Every day | ORAL | 2 refills | 30.00000 days | Status: CP
Start: 2019-03-13 — End: 2019-06-11
  Filled 2019-03-13: qty 30, 30d supply, fill #0

## 2019-03-13 MED ORDER — ASPIRIN 81 MG TABLET,DELAYED RELEASE
ORAL_TABLET | Freq: Every day | ORAL | 11 refills | 30.00000 days | Status: CP
Start: 2019-03-13 — End: 2020-03-12

## 2019-03-13 MED ORDER — DOCUSATE SODIUM 100 MG CAPSULE
ORAL_CAPSULE | Freq: Two times a day (BID) | ORAL | 0 refills | 30 days | Status: CP | PRN
Start: 2019-03-13 — End: ?
  Filled 2019-03-13: qty 60, 30d supply, fill #0

## 2019-03-13 MED ORDER — TRAMADOL 50 MG TABLET
ORAL_TABLET | Freq: Two times a day (BID) | ORAL | 0 refills | 8 days | Status: CP | PRN
Start: 2019-03-13 — End: ?
  Filled 2019-03-13: qty 30, 7d supply, fill #0

## 2019-03-13 MED ORDER — TACROLIMUS ER 4 MG TABLET,EXTENDED RELEASE 24 HR: 4 mg | tablet | Freq: Every day | 11 refills | 30 days | Status: AC

## 2019-03-13 MED ORDER — MYCOPHENOLATE SODIUM 180 MG TABLET,DELAYED RELEASE
ORAL_TABLET | Freq: Two times a day (BID) | ORAL | 11 refills | 30 days | Status: CP
Start: 2019-03-13 — End: 2020-03-12
  Filled 2019-03-13: qty 180, 30d supply, fill #0

## 2019-03-13 MED ORDER — TACROLIMUS ER 4 MG TABLET,EXTENDED RELEASE 24 HR
ORAL_TABLET | Freq: Every day | ORAL | 11 refills | 30.00000 days | Status: CP
Start: 2019-03-13 — End: 2020-03-12

## 2019-03-13 MED ORDER — TACROLIMUS ER 1 MG TABLET,EXTENDED RELEASE 24 HR: 1 mg | tablet | Freq: Every day | 11 refills | 30 days | Status: AC

## 2019-03-13 MED ORDER — GABAPENTIN 100 MG CAPSULE
ORAL_CAPSULE | Freq: Every evening | ORAL | 0 refills | 12 days | Status: CP
Start: 2019-03-13 — End: 2019-03-25
  Filled 2019-03-13: qty 12, 12d supply, fill #0

## 2019-03-13 MED FILL — POLYETHYLENE GLYCOL 3350 17 GRAM ORAL POWDER PACKET: 30 days supply | Qty: 30 | Fill #0 | Status: AC

## 2019-03-13 MED FILL — ENVARSUS XR 1 MG TABLET,EXTENDED RELEASE: 30 days supply | Qty: 90 | Fill #0 | Status: AC

## 2019-03-13 MED FILL — FAMOTIDINE 20 MG TABLET: 30 days supply | Qty: 30 | Fill #0 | Status: AC

## 2019-03-13 MED FILL — SULFAMETHOXAZOLE 400 MG-TRIMETHOPRIM 80 MG TABLET: 28 days supply | Qty: 12 | Fill #0 | Status: AC

## 2019-03-13 MED FILL — MYCOPHENOLATE SODIUM 180 MG TABLET,DELAYED RELEASE: 30 days supply | Qty: 180 | Fill #0 | Status: AC

## 2019-03-13 MED FILL — GABAPENTIN 100 MG CAPSULE: 12 days supply | Qty: 12 | Fill #0 | Status: AC

## 2019-03-13 MED FILL — DOK 100 MG CAPSULE: 30 days supply | Qty: 60 | Fill #0 | Status: AC

## 2019-03-13 MED FILL — ACETAMINOPHEN 500 MG TABLET: 12 days supply | Qty: 100 | Fill #0 | Status: AC

## 2019-03-13 MED FILL — ASPIRIN 81 MG TABLET,DELAYED RELEASE: 30 days supply | Qty: 30 | Fill #0 | Status: AC

## 2019-03-13 MED FILL — ENVARSUS XR 4 MG TABLET,EXTENDED RELEASE: 30 days supply | Qty: 90 | Fill #0 | Status: AC

## 2019-03-13 MED FILL — VALGANCICLOVIR 450 MG TABLET: 30 days supply | Qty: 30 | Fill #0 | Status: AC

## 2019-03-13 MED FILL — SULFAMETHOXAZOLE 400 MG-TRIMETHOPRIM 80 MG TABLET: ORAL | 28 days supply | Qty: 12 | Fill #0

## 2019-03-13 MED FILL — MG-PLUS-PROTEIN 133 MG TABLET: 50 days supply | Qty: 100 | Fill #0 | Status: AC

## 2019-03-13 MED FILL — TRAMADOL 50 MG TABLET: 7 days supply | Qty: 30 | Fill #0 | Status: AC

## 2019-03-13 MED FILL — ENVARSUS XR 1 MG TABLET,EXTENDED RELEASE: ORAL | 30 days supply | Qty: 90 | Fill #0

## 2019-03-13 NOTE — Unmapped (Signed)
Discharge Summary    Admit date: 03/09/2019    Discharge date and time: March 13, 2019 12:09 PM     Discharge to:  Home    Discharge Service: Surg Transplant [facility])    Discharge Attending Physician: Lilyan Punt [facility]*    Discharge  Diagnoses: ESRD    Secondary Diagnosis:   -none this admission     OR Procedures:    BACKBNCH STD PREP CAD DONR RENAL ALLOGFT PRIOR TO TRNSPLNT, INCL DISSEC/REM PERINEPH FAT, DIAPH/RTPER ATTAC  RENAL ALLOTRANSPLANTATION, IMPLANTATION OF GRAFT; WITHOUT RECIPIENT NEPHRECTOMY  Date  03/10/2019  -------------------     Ancillary Procedures: no procedures    Discharge Day Services:   The patient was seen and evaluated by transplant surgery and deemed appropriate for discharge.     Subjective   No acute events overnight. Pain Controlled. No fever or chills.    Objective   Patient Vitals for the past 8 hrs:   BP Temp Temp src Pulse Resp SpO2 Weight   03/13/19 0826 ??? ??? ??? ??? ??? ??? 58 kg (127 lb 14.4 oz)   03/13/19 0821 135/90 36 ??C Oral 96 16 100 % ???   03/13/19 0349 139/82 35.8 ??C Oral 88 16 98 % ???     I/O this shift:  In: 0   Out: 220 [Urine:200; Drains:20]    General Appearance:   No acute distress  Lungs:                Normal work of breathing on room air  Heart:                           Regular rate and rhythm  Abdomen:                Soft, non-tender, non-distended  Extremities:              Warm and well perfused Hospital Course:  The patient was taken to the OR on 03/10/2019 for kidney transplantation. She tolerated the procedure well, was extubated in the OR, and was taken to the SICU where she was monitored after have low blood pressures in the OR. She was then transferred to the Surgical Stepdown unit on the same day for close observation and cardiorespiratory monitoring. The allograft was evaluated with ultrasonography and found to have indices within normal limits.   She was initially placed on 1:1 UOP/IVF replacement and then transitioned to 0.9% NS at 158mL/hr. She was clinically stable postoperatively, maintaining adequate urine output, and was transferred to the floor on POD#1.     She did well thereafter and  diet was slowly advanced and at the time of discharge She was tolerating a regular diet. The patient was able to void spontaneously following discontinuation of Foley catheter POD#3, have her pain controlled with PO pain medication, and return to the preoperative ambulatory status. Anti-rejection medication levels were monitored and dosages adjusted to maintain a therapeutic regimen. The patient was seen and assessed by Physical Therapy and deemed suitable for discharge. She will be discharged home on POD#3 in stable condition without home healthcare.          Condition at Discharge: Improved  Discharge Medications:      Medication List      START taking these medications    ??? aspirin 81 MG tablet; Commonly known as: ECOTRIN; Take 1 tablet (81 mg   total) by mouth daily.  ??? docusate sodium 100  MG capsule; Commonly known as: COLACE; Take 1   capsule (100 mg total) by mouth two (2) times a day as needed for   constipation.  ??? famotidine 20 MG tablet; Commonly known as: PEPCID; Take 1 tablet (20 mg   total) by mouth daily.; Start taking on: March 14, 2019  ??? gabapentin 100 MG capsule; Commonly known as: NEURONTIN; Take 1 capsule   (100 mg total) by mouth nightly for 12 days. ??? magnesium (amino acid chelate) 133 mg Tab; Generic drug: magnesium   oxide-Mg AA chelate; Take 1 tablet by mouth Two (2) times a day. HOLD   until directed to start by your coordinator.  ??? mycophenolate 180 MG EC tablet; Commonly known as: MYFORTIC; Take 3   tablets (540 mg total) by mouth Two (2) times a day. Adjust dose per   medication card.  ??? polyethylene glycol 17 gram packet; Commonly known as: MIRALAX; Mix 1   packet (17 g) in 8oz of water, tea, coffee, or juice and drink by mouth   daily as needed (constipation).  ??? sulfamethoxazole-trimethoprim 400-80 mg per tablet; Commonly known as:   BACTRIM; Take 1 tablet (80 mg of trimethoprim total) by mouth Every   Monday, Wednesday, and Friday.; Start taking on: March 15, 2019  ??? * tacrolimus 4 mg Tb24 extended release tablet; Commonly known as:   ENVARSUS XR; Take 3 tablets (12 mg) by mouth daily. Adjust dose per   medication card.  ??? * tacrolimus 1 mg Tb24 extended release tablet; Commonly known as:   ENVARSUS XR; Take 3 tablets (3 mg) by mouth daily. Adjust dose per   medication card.  ??? traMADoL 50 mg tablet; Commonly known as: ULTRAM; Take 1-2 tablets   (50-100 mg total) by mouth every twelve (12) hours as needed for pain.  ??? valGANciclovir 450 mg tablet; Commonly known as: VALCYTE; Take 1 tablet   (450 mg total) by mouth daily.  * This list has 2 medication(s) that are the same as other medications   prescribed for you. Read the directions carefully, and ask your doctor or   other care provider to review them with you.     CHANGE how you take these medications    ??? acetaminophen 500 MG tablet; Commonly known as: TYLENOL; Take 1-2   tablets (500-1,000 mg total) by mouth every six (6) hours as needed for   pain or fever (> 38C).; What changed: medication strength, how much to   take, when to take this, reasons to take this     CONTINUE taking these medications    ??? cinacalcet 60 MG tablet; Commonly known as: SENSIPAR ??? clonazePAM 0.5 MG tablet; Commonly known as: KlonoPIN  ??? KEPPRA 250 MG tablet; Generic drug: levETIRAcetam     STOP taking these medications    ??? RENA-VITE ORAL  ??? sevelamer 800 mg tablet; Commonly known as: RENVELA       Pending Test Results: none    Discharge Instructions:  Other Instructions     Discharge instructions      Activity: Do not lift > 10-15 lbs for 1st 6 weeks, then gradually increase to 25 lbs over the following 6 weeks. Resume heavy lifting only after being cleared to do so at follow-up appointment in Transplant Surgery clinic.    Diet: Regular    Your Post-Transplant Coordinator is Philis Nettle. Contact your transplant coordinator or the Transplant Surgery Office [phone]) during business hours or page the transplant coordinator on call [phone]) after  business hours for:    - fever >100.5 degrees F by mouth, any fever with shaking chills, or other signs or symptoms of infection   - uncontrolled nausea, vomiting, or diarrhea; inability to have a bowel movement for > 3 days.   - any problem that prevents taking medications as scheduled.   - pain uncontrolled with prescribed medication or new pain or tenderness at the surgical site   - sudden weight gain or increase in blood pressure (greater than 140/85)   - shortness of breath, chest pain / discomfort   - new or increasing jaundice   - urinary symptoms including pain / difficulty / burning or tea-colored urine   - any other new or concerning symptoms   - questions regarding your medications or continuing care      Patient may shower, but should not immerse wounds in bath or pool for 2-3 weeks. Wash the surgical site with mild soap and water, but do not scrub vigorously.    You may dress wounds with dry gauze and tape to avoid soilage.    Do not drive or operate heavy machinery prior to MD clearance, or at any time while taking narcotics. Inspect surgical sites at least twice daily, contact Transplant Coordinator for spreading redness, purulent discharge, or increasing bleeding or drainage, or for separation of wounds.     Maintain a written record of daily vital signs, per Handbook instructions.     Maintain a written record of medications taken and review against the discharge medications sheet (orange paper). Periodically review your Transplant Handbook for important information regarding postoperative care and required precautions.      Labs and Other Follow-ups after Discharge:   Kidney  Labs 3x week: CBC, BMP, Mg, Phos and Tacrolimus trough  Labs every 3 months: Hepatic function panel    Transplant Coordinator:  Philis Nettle- phone: [phone] fax: [phone]             Labs and Other Follow-ups after Discharge:  Follow Up instructions and Outpatient Referrals     Discharge instructions            Future Appointments:  Appointments which have been scheduled for you    Mar 23, 2019 10:40 AM  (Arrive by 10:10 AM)  LAB ONLY with LAB PHLEB GRND UNCW  LAB PHLEB GRND FLR [employer] [facility] REGION) [address]  [place] [address]  [phone]      Mar 23, 2019 11:15 AM  (Arrive by 10:45 AM)  RETURN 15 with Leona Carry, MD  [facility] TRANSPLANT SURGERY [place] [facility] REGION) [address]  [place] [address]  [phone]      Mar 23, 2019 11:20 AM  (Arrive by 10:50 AM)  Nila Nephew W MD with [name], CPP  [facility] TRANSPLANT SURGERY [place] [facility] REGION) [address]  [place] [address]  [phone]      Apr 18, 2019  8:00 AM  (Arrive by 7:30 AM)  LAB ONLY with LAB PHLEB GRND UNCW  LAB PHLEB GRND FLR [employer] [facility] REGION) [address]  [place] [address]  [phone]      Apr 18, 2019  8:30 AM  (Arrive by 8:00 AM)  RETURN PHARMD with Fleeta Emmer, CPP [facility] KIDNEY TRANSPLANT [place] [facility] REGION) [address]  [place] [address]  [phone]      Apr 18, 2019  9:00 AM  (Arrive by 8:30 AM)  RETURN  GENERAL with Cordelia Poche, AGNP  [facility] KIDNEY TRANSPLANT [place] [facility] REGION) [address]  [place] [address]  [phone]

## 2019-03-14 DIAGNOSIS — Z940 Kidney transplant status: Principal | ICD-10-CM

## 2019-03-14 DIAGNOSIS — Z9489 Other transplanted organ and tissue status: Principal | ICD-10-CM

## 2019-03-14 DIAGNOSIS — D899 Disorder involving the immune mechanism, unspecified: Principal | ICD-10-CM

## 2019-03-14 LAB — HLA C2 AB SCR: Lab: POSITIVE

## 2019-03-14 MED ORDER — FAMOTIDINE 20 MG TABLET
ORAL_TABLET | Freq: Every day | ORAL | 11 refills | 30 days | Status: CP
Start: 2019-03-14 — End: 2020-03-13
  Filled 2019-03-13: qty 30, 30d supply, fill #0

## 2019-03-14 NOTE — Unmapped (Signed)
POST DISCHARGE FOLLOW UP CALL  Call to patient today to check on status since discharge. The following questions were asked:     How are you feeling right now? Is your pain controlled? Patient reports she is feeling good. Having pain but only taking tylenol once or twice a day. Encouraged her to take the tylenol as needed and save ultram for severe pain        Speaking of medicine, do you have all of your medications? yes   Are there medications on your medication list that are missing? no    Do you have a BP cuff, scale and thermometer?   Yes  How is your BP? Mother reports she took her BP last night and then again this morning and BP was elevated (153/99 and 156/103.  Patient was in pain at the time.  BP was 125/86 while on the phone. Asked them to call if BP >160/100    Do you have any new swelling? Swelling has gone down in feet.      Are you eating and drinking ok?  Has drank 24 ounces today and is going to keep drinking    Any nausea, vomiting or diarrhea? Some nausea with sensipar    Are you passing your urine OK? Yes. Urinating every 30 minutes about 1oomls.  Has not made urine for 3 years    No JP or foley    Wound is clean and dry. Discussed wound care and s/s of infection    Any issues getting your labs drawn: will go to [facility] tomorrow    Went over upcoming apts

## 2019-03-14 NOTE — Unmapped (Signed)
Patient was requesting bed side commode after CM hours at discharge. Called the patient this morning and started outpatient request for DME. Patient shared she bought one at [facility] and the request could be cancelled.    Patient presented in bright spirits and said she had a better night of sleep in her own bed.    No other needs identified.    Thomasene Mohair, LCSW, CCTSW  Transplant Social Worker/Case Manager  [facility]

## 2019-03-15 LAB — FSAB CLASS 2 ANTIBODY SPECIFICITY

## 2019-03-15 LAB — HLA CL1 ANTIBODY COMM: Lab: 0

## 2019-03-15 LAB — HLA CL2 AB COMMENT: Lab: 0

## 2019-03-15 MED ORDER — SULFAMETHOXAZOLE 400 MG-TRIMETHOPRIM 80 MG TABLET
ORAL_TABLET | ORAL | 5 refills | 28 days | Status: CP
Start: 2019-03-15 — End: 2019-09-11

## 2019-03-15 NOTE — Unmapped (Signed)
Enrolling patient in specialty calls at [facility].  Onboarding/welcome call set up for next week.

## 2019-03-15 NOTE — Unmapped (Signed)
Pt's family called as they had issues at [facility] this am.  They called to let us know that the [facility] did not have her insurance and I told her they need to take the insurance information to the lab.  Since they needed to get her insurance she was later taking her medicine at 1150am and I told her that was ok since she takes it at 1 and 47.  She also drank out of a water fountain without thinking when she took her medicine.  I told her I would not recommend that she do that again but that it was ok.  I told her we would decide after results come back tomorrow to see when to get labs next.

## 2019-03-16 DIAGNOSIS — D899 Disorder involving the immune mechanism, unspecified: Principal | ICD-10-CM

## 2019-03-16 DIAGNOSIS — Z940 Kidney transplant status: Principal | ICD-10-CM

## 2019-03-18 LAB — BACTERIA

## 2019-03-18 LAB — TACROLIMUS LEVEL: TACROLIMUS BLOOD: 5.1 ng/mL (ref 2.0–20.0)

## 2019-03-18 LAB — URINALYSIS
BILIRUBIN UA: NEGATIVE
NITRITE UA: NEGATIVE
PH UA: 5.5 (ref 5.0–7.5)
SPECIFIC GRAVITY UA: 1.018 (ref 1.005–1.030)

## 2019-03-18 LAB — MICROSCOPIC EXAMINATION: CASTS: NONE SEEN /LPF

## 2019-03-18 LAB — TACROLIMUS BLOOD: Tacrolimus:MCnc:Pt:Bld:Qn:LC/MS/MS: 5.1

## 2019-03-18 LAB — PROTEIN/CREAT RATIO: Protein/Creatinine:MRto:Pt:Urine:Qn:: 1108 — ABNORMAL HIGH

## 2019-03-18 LAB — PROTEIN / CREATININE RATIO, URINE: PROTEIN/CREAT RATIO: 1108 mg/g{creat} — ABNORMAL HIGH (ref 0–200)

## 2019-03-18 LAB — BLOOD UA

## 2019-03-20 DIAGNOSIS — Z940 Kidney transplant status: Principal | ICD-10-CM

## 2019-03-20 DIAGNOSIS — D899 Disorder involving the immune mechanism, unspecified: Principal | ICD-10-CM

## 2019-03-20 NOTE — Unmapped (Signed)
Called mom and went over clinic apt Thursday. Will have labs at [facility] Thursday.  Reports they had labs today and has been feeling good. Low BP reading this am 85/56 but went up after eating and drinking.    Denies any other needs

## 2019-03-21 DIAGNOSIS — Z940 Kidney transplant status: Principal | ICD-10-CM

## 2019-03-21 DIAGNOSIS — D899 Disorder involving the immune mechanism, unspecified: Principal | ICD-10-CM

## 2019-03-21 LAB — DECEASED DONOR CL I&II, LOW RES
DONOR LOW RES DRW #2: 52
DONOR LOW RES HLA A #1: 24
DONOR LOW RES HLA A #2: 24
DONOR LOW RES HLA B #1: 35
DONOR LOW RES HLA B #2: 39
DONOR LOW RES HLA BW #1: 6
DONOR LOW RES HLA C #1: 4
DONOR LOW RES HLA C #2: 7
DONOR LOW RES HLA DQ #1: 7
DONOR LOW RES HLA DR #1: 11
DONOR LOW RES HLA DR #2: 14

## 2019-03-21 LAB — DONOR LOW RES HLA C #1: Lab: 4

## 2019-03-21 NOTE — Unmapped (Signed)
This contains the following medications:  1) Mycophenolate  2) Envarsus  3) Valganciclovir  4) Cinacalcet      [facility]   Patient Onboarding/Medication Counseling    Carolyn Kelley is a 28 y.o. female with a kidney transplant who I am counseling today on continuation of therapy.  I am speaking to the patient's family member, Spoke to Carolyn Kelley's mother today about her medication..    Was a translator used for this call? No    Verified patient's date of birth / HIPAA.    Specialty medication(s) to be sent: Transplant: None- Patient currently has 3 weeks of medication on hand and does not need any at this time.      Non-specialty medications/supplies to be sent: none      Medications not needed at this time: none         The patient declined counseling on missed dose instructions, goals of therapy, side effects and monitoring parameters, warnings and precautions, drug/food interactions and storage, handling precautions, and disposal because they have taken the medication previously. The information in the declined sections below are for informational purposes only and was not discussed with patient.     Sensipar (cinacalcet)    Medication & Administration     Dosage: Take one tablet (60mg ) daily.    Administration:   ? Take with food or shortly after a meal.  ? Take whole, do not chew, crush, or divide tablets    Adherence/Missed dose instructions:  ? Take a missed dose as soon as you think about it, with food.  ? If it is close to the time for your next dose, skip the missed dose and go back to your normal time.  ? Do not take 2 doses at the same time or extra doses.    Goals of Therapy     ? Normalize serum calcium levels    Side Effects & Monitoring Parameters     ? Common side effects  ? Feeling dizzy, tired, or weak  ? Nausea, vomiting, diarrhea, and stomach pain  ? Headache  ? Back pain, muscle spasm/pain  ? Cough  ? Signs of a common cold ? The following side effects should be reported to the provider:  ? Allergic reaction  ? Signs of high blood pressure (very bad headaches or dizziness, passing out, or change in eyesight)  ? Low calcium levels (muscle cramps or spasms, numbness/tingling, or seizures)  ? Electrolyte issues (mood changes, confusion, muscle pain or weakness, abnormal heartbeat, lack of appetite, severe nausea/vomiting)  ? Dehydration (dry skin, dry mouth, dry eyes, increased thirst, fast heartbeat, dizziness, fast breathing, or confusion)  ? Heart issues (cough or shortness of breath, swelling in ankles or legs, abnormal heartbeat, weight gain > 5lbs in 24 hours, dizziness/passing out  ? Chest pain  ? Depression  ? Joint pain  ? Bone pain  ? Severe loss of strength and energy  ? Signs of stomach or bowel bleeding (black, tarry or bloddy stools, throwing up blood, throw up that looks like coffee grounds, very bad stomach pain, very upset stomach or throwing up)    ? Monitoring Parameters  ? Monitor serum calcium and phosphorus levels prior to starting and monthly   ? Monitor intact parathyroid hormone levels prior to starting and throughout therapy    Contraindications, Warnings, & Precautions     ? Adynamic bone disease - may develop if intact parathyroid hormone levels are suppressed <100pg/ml  ? Cardiovascular effects -  QT prolongation and ventricular arrhythmia secondary to hypocalcemia may occur  ? GI effects - GI bleeding  ? Hypocalcemia    Drug/Food Interactions     ? Medication list reviewed in Epic. The patient was instructed to inform the care team before taking any new medications or supplements. No drug interactions identified.     Storage, Handling Precautions, & Disposal   ? Store at room temperature  ? Keep away from children and pets The patient declined counseling on missed dose instructions, goals of therapy, side effects and monitoring parameters, warnings and precautions, drug/food interactions and storage, handling precautions, and disposal because they have taken the medication previously. The information in the declined sections below are for informational purposes only and was not discussed with patient.     Valcyte (valganciclovir)    Medication & Administration     Dosage:   ? Take one tablet daily.    Administration:   ? Take with food  ? Swallow the pills whole, do not break, crush, or chew    Adherence/Missed dose instructions:  ? Take a missed dose as soon as you think about it with food  ? If it is close to your next dose, skip the missed dose and go back to your normal time.  ? Do not take 2 doses at the same time or extra doses.  ? Report any missed doses to coordinator    Goals of Therapy     ? To prevent or treat CMV infection in setting of solid organ transplant    Side Effects & Monitoring Parameters     ? Common side effects  ? Headache  ? Diarrhea or constipation  ? Appetite or sleep disturbances  ? Back, muscle, joint, or belly pain  ? Weight loss  ? Dizziness  ? Muscle spasm  ? Upset stomach or vomiting    ? The following side effects should be reported to the provider:  ? Allergic reaction  (rash, hives, swelling, blistered or peeling skin, shortness of breath)  ? Infection (fever, chills, sore throat, ear/sinus pain, cough, sputum change, urinary pain, mouth sores, non-healing wounds)  ? Bleeding (cough ground vomit, blood in urine, black/red/tarry stools, unexplained bruising or bleeding)  ? Electrolyte problems (mood changes, confusion, weakness, abnormal heartbeat, seizures)  ? Kidney problems (urine changes, weight gain)  ? Yellowing skin or eyes  ? Swelling in arms, legs, stomach  ? Severe dizziness or passing out  ? Eye issues (eyesight changes, pain, or irritation)  ? Night sweats    ? Monitoring parameters ? Have eye exam as directed by doctor  ? CMV counts  ? CBC  ? Renal function  ? Pregnancy test prior to initiation    Contraindications, Warnings, & Precautions     ? BBW: severe leukopenia, neutropenia, anemia, thrombocytopenia, pancytopenia, and bone marrow failure, including aplastic anemia have been reported  ? BBW: may cause temporary or permanent inhibition of spermatogenesis and suppression of fertilty; has the potential to cause birth defects and cancers in humans  ? Female patients should have pregnancy test prior to initiation and use birth control for at least 30 days after discontinuation  ? Female patients should use a barrier contraceptive while on therapy and for 90 days after discontinuation  ? Acute renal failure  ? Not indicated for use in liver transplant recipients  ? Breastfeeding is not recommended    Drug/Food Interactions     ? Medication list reviewed in Epic. The patient was instructed  to inform the care team before taking any new medications or supplements. No drug interactions identified.   ? Check with your doctor before getting any vaccinations (live or inactivated)    Storage, Handling Precautions, & Disposal     ? Store at room temperature  ? Keep away from children and pets    The patient declined counseling on missed dose instructions, goals of therapy, side effects and monitoring parameters, warnings and precautions, drug/food interactions and storage, handling precautions, and disposal because they have taken the medication previously. The information in the declined sections below are for informational purposes only and was not discussed with patient.     Envarsus (tacrolimus XR)    Medication & Administration     Dosage: Take 1- 1mg  and 1-4mg  for a total dose of 5mg  daily.     Administration:   ? Take in the morning on empty stomach ??? 1 hour before or 2 hours after breakfast  ? Take with drink of water  ? Do not crush, break, or chew tablets    Adherence/Missed dose instructions: ? Take a missed dose as soon as you think about it.  ? If it has been more than 15 hours since missed dose, skip the missed dose and go back to your regular time  ? Report all missed doses to transplant coordinator    Goals of Therapy     ? To prevent organ rejection    Side Effects & Monitoring Parameters     ? Common side effects  ? Fatigue  ? Headache  ? Stuffy nose or sore throat  ? Nausea, vomiting, stomach pain, diarrhea, constipation  ? Heartburn  ? Back or joint pain  ? Increased risk of infection    ? The following side effects should be reported to the provider:  ? Allergic reaction  ? Kidney issues (change in quantity or urine passed, blood in urine, or weight gain)  ? High blood pressure (dizziness, change in eyesight, headache)  ? Electrolyte issues (change in mood, confusion, muscle pain, or weakness)  ? Abnormal breathing  ? Shakiness  ? Unexplained bleeding or bruising (gums bleeding, blood in urine, nosebleeds, any abnormal bleeding)  ? Signs of infection    ? Monitoring Parameters  ? Renal function  ? Liver function  ? Glucose levels  ? Blood pressure  ? Tacrolimus trough levels      Contraindications, Warnings, & Precautions     ? Black Box Warning: Infections - immunosuppressant agents increase the risk of infection that may lead to hospitalization or death  ? Black Box Warning: Malignancy - immunosuppressant agents may be associated with the development of malignancies that may lead to hospitalization or death.  Limit or avoid sun and ultraviolet light exposure, use appropriate sun protection  ? Myocardial hypertrophy -avoid use in patients with congenital long QT syndrome  ? Diabetes mellitus - the risk for new-onset diabetes and insulin-dependent post-transplant diabetes mellitus is increased with tacrolimus use after transplantation  ? GI perforation  ? Hyperkalemia  ? Hypertension  ? Nephrotoxicity  ? Neurotoxicity    Drug/Food Interactions ? Medication list reviewed in Epic. No drug interactions identified.   ? Due to amount and severity of drug interactions, report ALL medications starts, discontinuations, and changes to transplant coordinator prior to making the change  ? Avoid alcohol  ? Avoid grapefruit or grapefruit juice  ? Avoid live vaccines    Storage, Handling Precautions, & Disposal     ? Store  at room temperature  ? Keep away from children and pets  ?   The patient declined counseling on missed dose instructions, goals of therapy, side effects and monitoring parameters, warnings and precautions, drug/food interactions and storage, handling precautions, and disposal because they have taken the medication previously. The information in the declined sections below are for informational purposes only and was not discussed with patient.     Myfortic (mycophenolic acid)    Medication & Administration     Dosage:   ? Take 3 tablets two times a day.    Administration:   ? Take with or without food, although taking with food helps minimize GI side effects.  ? Swallow the pills whole, do not chew or crush    Adherence/Missed dose instructions:  ? Take a missed dose as soon as you think about it.  ? If it is less than 2 hours until your next dose, skip the missed dose and go back to your normal time.  ? Do not take 2 doses at the same time or extra doses.    Goals of Therapy     ? To prevent organ rejection    Side Effects & Monitoring Parameters     ? Common side effects  ? Back or joint pain  ? Constipation  ? Headache/dizziness  ? Not hungry  ? Stomach pain, diarrhea, constipation, gas, upset stomach, vomiting, nausea  ? Feeling tired or weak  ? Shakiness  ? Trouble sleeping  ? Increased risk of infection    ? The following side effects should be reported to the provider:  ? Allergic reaction ? High blood sugar (confusion, feeling sleepy, more thirst, more hungry, passing urine more often, flushing, fast breathing, or breath that smells like fruit)  ? Electrolyte issues (mood changes, confusion, muscle pain or weakness, a heartbeat that does not feel normal, seizures, not hungry, or very bad upset stomach or throwing up)  ? High or low blood pressure (bad headache or dizziness, passing out, or change in eyesight)  ? Kidney issues (unable to pass urine, change in how much urine is passed, blood in the urine, or a big weight gain)  ? Skin (oozing, heat, swelling, redness, or pain), UTI and other infections   ? Chest pain or pressure  ? Abnormal heartbeat  ? Unexplained bleeding or bruising  ? Abnormal burning, numbness, or tingling  ? Muscle cramps,  ? Yellowing of skin or eyes    ? Monitoring parameters  ? Pregnancy test initially prior to treatment and 8-10 days later then as needed)  ? CBC weekly for first month then twice monthly for next 2 months, then monthly)  ? Monitor Renal and liver functions  ? Signs of organ rejection    Contraindications, Warnings, & Precautions     ? *This is a REMS drug and an FDA-approved patient medication guide will be printed with each dispensation  ? Black Box Warning: Infections   ? Black Box Warning: Lymphoproliferative disorders - risk of development of lymphoma and skin malignancy is increased  ? Black Box Warning: Use during pregnancy is associated with increased risks of first trimester pregnancy loss and congenital malformations.   ? Black Box Warning: Females of reproductive potential should use contraception during treatment and for 6 weeks after therapy is discontinued  ? CNS depression  ? New or reactivated viral infections  ? Neutropenia  ? Female patients and/or their female partners should use effective contraception during treatment of the female  patient and for at least 3 months after last dose. ? Breastfeeding is not recommended during therapy and for 6 weeks after last dose    Drug/Food Interactions     ? Medication list reviewed in Epic. The patient was instructed to inform the care team before taking any new medications or supplements. No drug interactions identified.   ? Do not take Echinacea while on this medication  ? Check with your doctor before getting any vaccinations (live or inactivated)    Storage, Handling Precautions, & Disposal     ? Store at room temperature  ? Keep away from children and pets  ? This drug is considered hazardous and should be handled as little as possible.  Wash hands before and after touching pills. If someone else helps with medication administration, they should wear gloves.      Current Medications (including OTC/herbals), Comorbidities and Allergies     Current Outpatient Medications   Medication Sig Dispense Refill   ??? acetaminophen (TYLENOL) 500 MG tablet Take 1-2 tablets (500-1,000 mg total) by mouth every six (6) hours as needed for pain or fever (> 38C). 100 tablet 0   ??? aspirin (ECOTRIN) 81 MG tablet Take 1 tablet (81 mg total) by mouth daily. 30 tablet 11   ??? cinacalcet (SENSIPAR) 60 MG tablet Take 60 mg by mouth daily.     ??? clonazePAM (KLONOPIN) 0.5 MG tablet Take 0.5 mg by mouth Two (2) times a day.     ??? docusate sodium (COLACE) 100 MG capsule Take 1 capsule (100 mg total) by mouth two (2) times a day as needed for constipation. 60 capsule 0   ??? famotidine (PEPCID) 20 MG tablet Take 1 tablet (20 mg total) by mouth daily. 30 tablet 11   ??? gabapentin (NEURONTIN) 100 MG capsule Take 1 capsule (100 mg total) by mouth nightly for 12 days. 12 capsule 0   ??? levETIRAcetam (KEPPRA) 250 MG tablet Take 250 mg by mouth Two (2) times a day.     ??? magnesium oxide-Mg AA chelate (MAGNESIUM, AMINO ACID CHELATE,) 133 mg Tab Take 1 tablet by mouth Two (2) times a day. HOLD until directed to start by your coordinator. 100 tablet 11 ??? mycophenolate (MYFORTIC) 180 MG EC tablet Take 3 tablets (540 mg total) by mouth Two (2) times a day. Adjust dose per medication card. 180 tablet 11   ??? polyethylene glycol (MIRALAX) 17 gram packet Mix 1 packet (17 g) in 8oz of water, tea, coffee, or juice and drink by mouth daily as needed (constipation). 30 each 0   ??? sulfamethoxazole-trimethoprim (BACTRIM) 400-80 mg per tablet Take 1 tablet (80 mg of trimethoprim total) by mouth Every Monday, Wednesday, and Friday. 12 tablet 5   ??? tacrolimus (ENVARSUS XR) 1 mg Tb24 extended release tablet Take 1 mg by mouth daily. 30 tablet 11   ??? tacrolimus (ENVARSUS XR) 4 mg Tb24 extended release tablet Take 4 mg by mouth daily. 30 tablet 11   ??? traMADoL (ULTRAM) 50 mg tablet Take 1-2 tablets (50-100 mg total) by mouth every twelve (12) hours as needed for pain. 30 tablet 0   ??? valGANciclovir (VALCYTE) 450 mg tablet Take 1 tablet (450 mg total) by mouth daily. 30 tablet 2     No current facility-administered medications for this visit.        Allergies   Allergen Reactions   ??? Pollen Extracts Swelling       Patient Active Problem List   Diagnosis   ???  Acute glomerulonephritis   ??? Cardiac arrest (CMS-HCC)   ??? Iron deficiency anemia   ??? Convulsions (CMS-HCC)   ??? ESRD on hemodialysis (CMS-HCC)   ??? Ascites   ??? Cardiomyopathy (CMS-HCC)   ??? Chronic kidney disease   ??? Abnormal gait   ??? Myoclonus   ??? Generalized convulsive epilepsy (CMS-HCC)   ??? Cerebral anoxic injury (CMS-HCC)   ??? Seizures (CMS-HCC)   ??? Post hypoxic myoclonus   ??? Atypical squamous cells of undetermined significance on cytologic smear of cervix (ASC-US)   ??? Deceased-donor kidney transplant recipient   ??? Hypervolemia   ??? MPGN (membranoproliferative glomerulonephritis), type 2   ??? Pneumonia   ??? Pulmonary edema   ??? Secondary hyperparathyroidism of renal origin (CMS-HCC)   ??? Hypertension   ??? Hemorrhagic shock (CMS-HCC)   ??? C. difficile colitis   ??? Anemia in chronic kidney disease   ??? Acute respiratory failure (CMS-HCC) ??? Abnormal Pap smear of cervix   ??? Acute heart failure (CMS-HCC)   ??? End stage renal disease (CMS-HCC)   ??? Coagulation defect, unspecified (CMS-HCC)       Reviewed and up to date in Epic.    Appropriateness of Therapy     Is medication and dose appropriate based on diagnosis? Yes    Prescription has been clinically reviewed: Yes    Baseline Quality of Life Assessment      How many days over the past month did your kidney transplant keep you from your normal activities? 8    Financial Information     Medication Assistance provided: None Required    Anticipated copay of $3-Mycophenolate, $3- Envarsu1mg  and 4mg , $3-Valganciclovir reviewed with patient. Verified delivery address.    Delivery Information     Scheduled delivery date: Patient currently has 3 weeks of medicine on hand and does not need anything at this time.    Expected start date: Patient has medicine at home and is currently taking it.    Medication will be delivered via UPS to the prescription address in [facility].  This shipment will not require a signature.      Explained the services we provide at [facility] and that each month we would call to set up refills.  Stressed importance of returning phone calls so that we could ensure they receive their medications in time each month.  Informed patient that we should be setting up refills 7-10 days prior to when they will run out of medication.  A pharmacist will reach out to perform a clinical assessment periodically.  Informed patient that a welcome packet and a drug information handout will be sent.      Patient verbalized understanding of the above information as well as how to contact the pharmacy at [phone] option 4 with any questions/concerns.  The pharmacy is open Monday through Friday 8:30am-4:30pm.  A pharmacist is available 24/7 via pager to answer any clinical questions they may have.    Patient Specific Needs ? Does the patient have any physical, cognitive, or cultural barriers? No    ? Patient prefers to have medications discussed with  Family Member     ? Is the patient or caregiver able to read and understand education materials at a high school level or above? Yes    ? Patient's primary language is  English     ? Is the patient high risk? Yes, patient is taking a REMS drug. Medication is dispensed in compliance with REMS program.     ?  Does the patient require a Care Management Plan? No     ? Does the patient require physician intervention or other additional services (i.e. nutrition, smoking cessation, social work)? No      Tera Helper  [facility] Specialty Pharmacist

## 2019-03-22 DIAGNOSIS — Z940 Kidney transplant status: Principal | ICD-10-CM

## 2019-03-22 LAB — EGFR CKD-EPI NON-AA FEMALE: Lab: 66

## 2019-03-22 LAB — URINALYSIS
BILIRUBIN UA: NEGATIVE
GLUCOSE UA: NEGATIVE
NITRITE UA: NEGATIVE
SPECIFIC GRAVITY UA: 1.018
UROBILINOGEN UA: 0.2

## 2019-03-22 LAB — BASIC METABOLIC PANEL
BLOOD UREA NITROGEN: 24 mg/dL — ABNORMAL HIGH
BLOOD UREA NITROGEN: 8 mg/dL
CALCIUM: 8.7 mg/dL
CALCIUM: 7.9 mg/dL — ABNORMAL LOW
CHLORIDE: 102 mmol/L
CREATININE: 1.13 mg/dL — ABNORMAL HIGH
CREATININE: 0.8 mg/dL
EGFR CKD-EPI AA FEMALE: 76 mL/min/{1.73_m2}
EGFR CKD-EPI AA FEMALE: 115 mL/min/{1.73_m2}
EGFR CKD-EPI NON-AA FEMALE: 66 mL/min/{1.73_m2}
EGFR CKD-EPI NON-AA FEMALE: 100 mL/min/{1.73_m2}
GLUCOSE RANDOM: 49 mg/dL — ABNORMAL LOW
POTASSIUM: 4.6 mmol/L
POTASSIUM: 4.5 mmol/L
SODIUM: 138 mmol/L
SODIUM: 139 mmol/L

## 2019-03-22 LAB — CBC W/ DIFFERENTIAL
BASOPHILS ABSOLUTE COUNT: 0 10*9/L
BASOPHILS ABSOLUTE COUNT: 0 10*9/L
BASOPHILS RELATIVE PERCENT: 0 %
BASOPHILS RELATIVE PERCENT: 1 %
EOSINOPHILS ABSOLUTE COUNT: 0 10*9/L
EOSINOPHILS RELATIVE PERCENT: 0 %
EOSINOPHILS RELATIVE PERCENT: 0 %
HEMATOCRIT: 31.6 % — ABNORMAL LOW
HEMATOCRIT: 31 % — ABNORMAL LOW
HEMOGLOBIN: 10.8 g/dL — ABNORMAL LOW
HEMOGLOBIN: 10.3 g/dL — ABNORMAL LOW
LYMPHOCYTES ABSOLUTE COUNT: 0 10*9/L — ABNORMAL LOW
LYMPHOCYTES ABSOLUTE COUNT: 0.1 10*9/L — ABNORMAL LOW
LYMPHOCYTES RELATIVE PERCENT: 0 %
LYMPHOCYTES RELATIVE PERCENT: 1 %
MEAN CORPUSCULAR HEMOGLOBIN CONC: 34.2 g/dL
MEAN CORPUSCULAR HEMOGLOBIN CONC: 33.2 g/dL
MEAN CORPUSCULAR HEMOGLOBIN: 34 pg — ABNORMAL HIGH
MEAN CORPUSCULAR HEMOGLOBIN: 33.4 pg — ABNORMAL HIGH
MEAN CORPUSCULAR VOLUME: 99 fL — ABNORMAL HIGH
MEAN CORPUSCULAR VOLUME: 101 fL — ABNORMAL HIGH
MONOCYTES ABSOLUTE COUNT: 0.2 10*9/L
MONOCYTES ABSOLUTE COUNT: 0.3 10*9/L
MONOCYTES RELATIVE PERCENT: 6 %
MONOCYTES RELATIVE PERCENT: 10 %
NEUTROPHILS ABSOLUTE COUNT: 2.9 10*9/L
NEUTROPHILS ABSOLUTE COUNT: 3.1 10*9/L
NEUTROPHILS RELATIVE PERCENT: 93 %
NEUTROPHILS RELATIVE PERCENT: 87 %
PLATELET COUNT: 152 10*9/L
PLATELET COUNT: 248 10*9/L
RED BLOOD CELL COUNT: 3.18 10*12/L — ABNORMAL LOW
RED CELL DISTRIBUTION WIDTH: 14 %
RED CELL DISTRIBUTION WIDTH: 13.8 %
WBC ADJUSTED: 3.1 10*9/L — ABNORMAL LOW
WBC ADJUSTED: 3.5 10*9/L

## 2019-03-22 LAB — BILIRUBIN UA: Lab: NEGATIVE

## 2019-03-22 LAB — MAGNESIUM
Lab: 1.2 — ABNORMAL LOW
Lab: 1.7

## 2019-03-22 LAB — PROTEIN URINE: Lab: 100.9

## 2019-03-22 LAB — TACROLIMUS, TROUGH
Lab: 5.1
Lab: 8.8

## 2019-03-22 LAB — PHOSPHORUS: Lab: 3.9

## 2019-03-22 LAB — ALBUMIN
Lab: 3.6 — ABNORMAL LOW
Lab: 3.8 — ABNORMAL LOW

## 2019-03-22 LAB — PROTEIN / CREATININE RATIO, URINE: PROTEIN/CREAT RATIO, URINE: 1.108 — ABNORMAL HIGH

## 2019-03-22 LAB — EGFR CKD-EPI AA MALE: Lab: 0

## 2019-03-22 LAB — BASOPHILS ABSOLUTE COUNT: Lab: 0

## 2019-03-22 LAB — LYMPHOCYTES RELATIVE PERCENT: Lab: 1

## 2019-03-22 NOTE — Unmapped (Signed)
Called patient to review labs and apt for tomorrow    Patient reports she is doing great. Pain is getting better.  BP stable 90-100/60-70. Denies any HA, n/v/d, numbness or tingling, swelling, urinary problems, or tremor.    Denies any needs

## 2019-03-23 ENCOUNTER — Encounter
Admit: 2019-03-23 | Discharge: 2019-03-23 | Payer: PRIVATE HEALTH INSURANCE | Attending: Physician Assistant | Primary: Physician Assistant

## 2019-03-23 ENCOUNTER — Encounter: Admit: 2019-03-23 | Discharge: 2019-03-23 | Payer: PRIVATE HEALTH INSURANCE

## 2019-03-23 DIAGNOSIS — D899 Disorder involving the immune mechanism, unspecified: Principal | ICD-10-CM

## 2019-03-23 DIAGNOSIS — Z940 Kidney transplant status: Principal | ICD-10-CM

## 2019-03-23 DIAGNOSIS — Z0000 Encounter for general adult medical examination without abnormal findings: Principal | ICD-10-CM

## 2019-03-23 LAB — SLIDE REVIEW

## 2019-03-23 LAB — CBC W/ AUTO DIFF
BASOPHILS RELATIVE PERCENT: 0.5 %
EOSINOPHILS ABSOLUTE COUNT: 0.1 10*9/L (ref 0.0–0.4)
EOSINOPHILS RELATIVE PERCENT: 0.9 %
HEMATOCRIT: 35.1 % — ABNORMAL LOW (ref 36.0–46.0)
HEMOGLOBIN: 10.9 g/dL — ABNORMAL LOW (ref 12.0–16.0)
LARGE UNSTAINED CELLS: 1 % (ref 0–4)
LYMPHOCYTES ABSOLUTE COUNT: 0.1 10*9/L — ABNORMAL LOW (ref 1.5–5.0)
MEAN CORPUSCULAR HEMOGLOBIN CONC: 31.2 g/dL (ref 31.0–37.0)
MEAN CORPUSCULAR HEMOGLOBIN: 33.3 pg (ref 26.0–34.0)
MEAN CORPUSCULAR VOLUME: 106.9 fL — ABNORMAL HIGH (ref 80.0–100.0)
MEAN PLATELET VOLUME: 9 fL (ref 7.0–10.0)
MONOCYTES ABSOLUTE COUNT: 0.2 10*9/L (ref 0.2–0.8)
MONOCYTES RELATIVE PERCENT: 4.4 %
NEUTROPHILS ABSOLUTE COUNT: 4.4 10*9/L (ref 2.0–7.5)
NEUTROPHILS RELATIVE PERCENT: 91.4 %
PLATELET COUNT: 332 10*9/L (ref 150–440)
RED BLOOD CELL COUNT: 3.28 10*12/L — ABNORMAL LOW (ref 4.00–5.20)
RED CELL DISTRIBUTION WIDTH: 16.3 % — ABNORMAL HIGH (ref 12.0–15.0)
WBC ADJUSTED: 4.9 10*9/L (ref 4.5–11.0)

## 2019-03-23 LAB — URINALYSIS
BILIRUBIN UA: NEGATIVE
GLUCOSE UA: NEGATIVE
HYALINE CASTS: 1 /LPF (ref 0–1)
NITRITE UA: NEGATIVE
PH UA: 5 (ref 5.0–9.0)
SPECIFIC GRAVITY UA: 1.015 (ref 1.003–1.030)
SQUAMOUS EPITHELIAL: 2 /HPF (ref 0–5)
UROBILINOGEN UA: 0.2
WBC UA: 10 /HPF — ABNORMAL HIGH (ref 0–5)

## 2019-03-23 LAB — BASIC METABOLIC PANEL
ANION GAP: 6 mmol/L — ABNORMAL LOW (ref 7–15)
BLOOD UREA NITROGEN: 12 mg/dL (ref 7–21)
BUN / CREAT RATIO: 16
CHLORIDE: 102 mmol/L (ref 98–107)
CO2: 27 mmol/L (ref 22.0–30.0)
EGFR CKD-EPI AA FEMALE: >90 mL/min/{1.73_m2} (ref >=60)
EGFR CKD-EPI NON-AA FEMALE: >90 mL/min/{1.73_m2} (ref >=60)
GLUCOSE RANDOM: 82 mg/dL (ref 70–99)
POTASSIUM: 4.5 mmol/L (ref 3.5–5.0)
SODIUM: 135 mmol/L (ref 135–145)

## 2019-03-23 LAB — MAGNESIUM: Magnesium:MCnc:Pt:Ser/Plas:Qn:: 1 — ABNORMAL LOW

## 2019-03-23 LAB — TOXIC VACUOLATION

## 2019-03-23 LAB — PROTEIN / CREATININE RATIO, URINE
PROTEIN URINE: 16.5 mg/dL
PROTEIN/CREAT RATIO, URINE: 0.178

## 2019-03-23 LAB — EOSINOPHILS RELATIVE PERCENT: Eosinophils/100 leukocytes:NFr:Pt:Bld:Qn:Automated count: 0.9

## 2019-03-23 LAB — WBC UA: Leukocytes:Naric:Pt:Urine sed:Qn:Microscopy.light.HPF: 10 — ABNORMAL HIGH

## 2019-03-23 LAB — TACROLIMUS, TROUGH: Lab: 6.7

## 2019-03-23 LAB — CHLORIDE: Chloride:SCnc:Pt:Ser/Plas:Qn:: 102

## 2019-03-23 LAB — PROTEIN URINE: Protein:MCnc:Pt:Urine:Qn:: 16.5

## 2019-03-23 LAB — PHOSPHORUS: Phosphate:MCnc:Pt:Ser/Plas:Qn:: 4.9 — ABNORMAL HIGH

## 2019-03-23 MED ORDER — MG-PLUS-PROTEIN 133 MG TABLET
ORAL_TABLET | Freq: Two times a day (BID) | ORAL | 11 refills | 0.00000 days | Status: CP
Start: 2019-03-23 — End: 2019-03-23
  Filled 2019-04-04: qty 120, 30d supply, fill #0

## 2019-03-23 MED ORDER — TACROLIMUS ER 1 MG TABLET,EXTENDED RELEASE 24 HR
ORAL_TABLET | Freq: Every day | ORAL | 11 refills | 30 days | Status: CP
Start: 2019-03-23 — End: 2020-03-22

## 2019-03-23 MED ORDER — MG-PLUS-PROTEIN 133 MG TABLET: 2 | tablet | Freq: Two times a day (BID) | 11 refills | 0 days | Status: AC

## 2019-03-23 MED ORDER — CLONAZEPAM 0.5 MG TABLET
ORAL_TABLET | Freq: Two times a day (BID) | ORAL | 5 refills | 30 days | Status: CP
Start: 2019-03-23 — End: ?
  Filled 2019-04-17: qty 60, 30d supply, fill #0

## 2019-03-23 MED ORDER — FAMOTIDINE 20 MG TABLET
ORAL_TABLET | Freq: Every day | ORAL | 11 refills | 30.00000 days | Status: CP
Start: 2019-03-23 — End: 2020-03-22
  Filled 2019-04-04: qty 30, 30d supply, fill #0

## 2019-03-23 MED ORDER — ASPIRIN 81 MG TABLET,DELAYED RELEASE
ORAL_TABLET | Freq: Every day | ORAL | 11 refills | 30 days | Status: CP
Start: 2019-03-23 — End: 2020-03-22
  Filled 2019-04-04: qty 90, 90d supply, fill #0

## 2019-03-23 MED ORDER — LEVETIRACETAM 250 MG TABLET
ORAL_TABLET | Freq: Two times a day (BID) | ORAL | 3 refills | 90.00000 days | Status: CP
Start: 2019-03-23 — End: ?

## 2019-03-23 MED ORDER — TACROLIMUS ER 4 MG TABLET,EXTENDED RELEASE 24 HR
ORAL_TABLET | Freq: Every day | ORAL | 11 refills | 30 days | Status: CP
Start: 2019-03-23 — End: 2020-03-22

## 2019-03-23 NOTE — Unmapped (Signed)
Urine collected and sent to the lab

## 2019-03-23 NOTE — Unmapped (Signed)
TRANSPLANT SURGERY PROGRESS NOTE    Assessment and Plan  Carolyn Kelley is a 28 y.o. female with history of ESRD s/p DDKT 03/09/2009 who presents for post-operative follow-up. She is doing well clinically.    Labs reveal normal creatinine. Mg is low to 1.0. No other concerning lab findings.    - Continue hydration and adequate PO intake  - Start Mg supplement  - Further medication changes per pharmacy  - Lab frequency: 3x/ week  - Surgery follow-up in 3-4 weeks for staple removal    Subjective  Carolyn Kelley is a 28 y.o. female with history of ESRD s/p DDKT 03/09/2009 who presents for post-operative follow-up. She reports feeling well overall. She is using Tylenol QAM and Tramadol QPM for pain control, but reports needing it less and less. She denies fever, chills, nausea, vomiting, diarrhea, constipation, or dysuria. She is taking all medications as prescribed.       Objective    Vitals:    03/23/19 1100   BP: 106/69   Pulse: 96   Temp: 36.3 ??C (97.3 ??F)   TempSrc: Tympanic   Weight: 57.5 kg (126 lb 11.2 oz)   Height: 157.5 cm (5' 2)      Body mass index is 23.17 kg/m??.     Physical Exam:    General Appearance:   No acute distress  Lungs:                Normal work of breathing on room air  Heart:                           Regular rate and rhythm  Abdomen:                Soft, non-tender, non-distended, incision c/d/i with staples  Extremities:              Warm and well perfused        Data Review:  All lab results last 24 hours:    Recent Results (from the past 24 hour(s))   Basic Metabolic Panel    Collection Time: 03/23/19 10:32 AM   Result Value Ref Range    Sodium 135 135 - 145 mmol/L    Potassium 4.5 3.5 - 5.0 mmol/L    Chloride 102 98 - 107 mmol/L    CO2 27.0 22.0 - 30.0 mmol/L    Anion Gap 6 (L) 7 - 15 mmol/L    BUN 12 7 - 21 mg/dL    Creatinine 1.61 0.96 - 1.00 mg/dL    BUN/Creatinine Ratio 16     EGFR CKD-EPI Non-African American, Female >90 >=60 mL/min/1.13m2 EGFR CKD-EPI African American, Female >90 >=60 mL/min/1.56m2    Glucose 82 70 - 99 mg/dL    Calcium 7.8 (L) 8.5 - 10.2 mg/dL   Phosphorus Level    Collection Time: 03/23/19 10:32 AM   Result Value Ref Range    Phosphorus 4.9 (H) 2.9 - 4.7 mg/dL   Magnesium Level    Collection Time: 03/23/19 10:32 AM   Result Value Ref Range    Magnesium 1.0 (L) 1.6 - 2.2 mg/dL   CBC w/ Differential    Collection Time: 03/23/19 10:32 AM   Result Value Ref Range    WBC 4.9 4.5 - 11.0 10*9/L    RBC 3.28 (L) 4.00 - 5.20 10*12/L    HGB 10.9 (L) 12.0 - 16.0 g/dL    HCT 04.5 (L) 40.9 - 46.0 %  MCV 106.9 (H) 80.0 - 100.0 fL    MCH 33.3 26.0 - 34.0 pg    MCHC 31.2 31.0 - 37.0 g/dL    RDW 16.1 (H) 09.6 - 15.0 %    MPV 9.0 7.0 - 10.0 fL    Platelet 332 150 - 440 10*9/L    Neutrophils % 91.4 %    Lymphocytes % 2.0 %    Monocytes % 4.4 %    Eosinophils % 0.9 %    Basophils % 0.5 %    Absolute Neutrophils 4.4 2.0 - 7.5 10*9/L    Absolute Lymphocytes 0.1 (L) 1.5 - 5.0 10*9/L    Absolute Monocytes 0.2 0.2 - 0.8 10*9/L    Absolute Eosinophils 0.1 0.0 - 0.4 10*9/L    Absolute Basophils 0.0 0.0 - 0.1 10*9/L    Large Unstained Cells 1 0 - 4 %    Macrocytosis Marked (A) Not Present    Anisocytosis Slight (A) Not Present    Hypochromasia Marked (A) Not Present         Imaging: none

## 2019-03-23 NOTE — Unmapped (Signed)
[facility] TRANSPLANT [facility]INIC PHARMACY NOTE  03/23/2019   Carolyn Kelley  [id number]    Medication changes today:   1. Start mg plus protein 266 mg BID  2. Stop Sensipar    Education/Adherence tools provided today:  1.provided updated medication list  2. provided additional pill box education  3.  provided additional education on immunosuppression and transplant related medications including reviewing indications of medications, dosing and side effects    Follow up items:  1. goal of understanding indications and dosing of immunosuppression medications  2. Calcium off of Sensipar  3. Magnesium    Next visit with pharmacy in 1-2 weeks  ____________________________________________________________________    Carolyn Kelley is a 28 y.o. 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    Allergies   Allergen Reactions   ??? Pollen Extracts Swelling       All medications reviewed and updated.     Medication list includes revisions made during today???s encounter    Outpatient Encounter Medications as of 03/23/2019   Medication Sig Dispense Refill   ??? acetaminophen (TYLENOL) 500 MG tablet Take 1-2 tablets (500-1,000 mg total) by mouth every six (6) hours as needed for pain or fever (> 38C). 100 tablet 0   ??? aspirin (ECOTRIN) 81 MG tablet Take 1 tablet (81 mg total) by mouth daily. 30 tablet 11   ??? cinacalcet (SENSIPAR) 60 MG tablet Take 60 mg by mouth daily.     ??? clonazePAM (KLONOPIN) 0.5 MG tablet Take 0.5 mg by mouth Two (2) times a day.     ??? docusate sodium (COLACE) 100 MG capsule Take 1 capsule (100 mg total) by mouth two (2) times a day as needed for constipation. 60 capsule 0   ??? famotidine (PEPCID) 20 MG tablet Take 1 tablet (20 mg total) by mouth daily. 30 tablet 11   ??? gabapentin (NEURONTIN) 100 MG capsule Take 1 capsule (100 mg total) by mouth nightly for 12 days. 12 capsule 0   ??? levETIRAcetam (KEPPRA) 250 MG tablet Take 250 mg by mouth Two (2) times a day.     ??? magnesium oxide-Mg AA chelate (MAGNESIUM, AMINO ACID CHELATE,) 133 mg Tab Take 1 tablet by mouth Two (2) times a day. HOLD until directed to start by your coordinator. 100 tablet 11   ??? mycophenolate (MYFORTIC) 180 MG EC tablet Take 3 tablets (540 mg total) by mouth Two (2) times a day. Adjust dose per medication card. 180 tablet 11   ??? polyethylene glycol (MIRALAX) 17 gram packet Mix 1 packet (17 g) in 8oz of water, tea, coffee, or juice and drink by mouth daily as needed (constipation). 30 each 0   ??? sulfamethoxazole-trimethoprim (BACTRIM) 400-80 mg per tablet Take 1 tablet (80 mg of trimethoprim total) by mouth Every Monday, Wednesday, and Friday. 12 tablet 5   ??? tacrolimus (ENVARSUS XR) 1 mg Tb24 extended release tablet Take 1 mg by mouth daily. 30 tablet 11   ??? tacrolimus (ENVARSUS XR) 4 mg Tb24 extended release tablet Take 4 mg by mouth daily. 30 tablet 11   ???  traMADoL (ULTRAM) 50 mg tablet Take 1-2 tablets (50-100 mg total) by mouth every twelve (12) hours as needed for pain. 30 tablet 0   ??? valGANciclovir (VALCYTE) 450 mg tablet Take 1 tablet (450 mg total) by mouth daily. 30 tablet 2     No facility-administered encounter medications on file as of 03/23/2019.        Induction agent : alemtuzumab    CURRENT IMMUNOSUPPRESSION: Envarsus 5 mg PO qd  prograf/Envarsus/cyclosporine goal: 8-10   [id number]  mg PO bid    steroid free     Patient complains of occasional HA, already had tremors pre transplant    IMMUNOSUPPRESSION DRUG LEVELS:  Lab Results   Component Value Date    Tacrolimus, Trough 8.8 03/20/2019    Tacrolimus, Trough 5.1 03/15/2019    Tacrolimus, Trough 4.6 (L) 03/13/2019 Tacrolimus Lvl 5.1 03/15/2019     No results found for: CYCLO  No results found for: EVEROLIMUS  No results found for: SIROLIMUS    Envarsus level is accurate 24 hour trough    Graft function: stable  DSA: ntd  Zero hour biopsy: without diagnostic abnormalities  Biopsies to date: ntd  UPC: pending  WBC/ANC:  wnl    Plan: Envarsus level is pending. Continue to monitor.    OI Prophylaxis:   CMV Status: D+/ R+, moderate risk . CMV prophylaxis: valganciclovir 450 mg every other day x 3 months per protocol.  No results found for: CMVCP  PCP Prophylaxis: bactrim SS 1 tab MWF x 6 months.  Thrush: completed in hospital  Patient is  tolerating infectious prophylaxis well    Plan: Increase Valcyte to 450 mg daily. Continue to monitor.    CV Prophylaxis: asa 81 mg   The ASCVD Risk score Denman George DC Montez Hageman, et al., 2013) failed to calculate.  Statin therapy: Not indicated  Plan:  Continue to monitor     BP: Goal < 140/90. Clinic vitals reported above  Home BP ranges: 90-100/60-70  Current meds include: none  Plan: within goal. Continue to monitor    Anemia of CKD:  H/H:   Lab Results   Component Value Date    HGB 10.3 (L) 03/20/2019     Lab Results   Component Value Date    HCT 31.0 (L) 03/20/2019     Iron panel:  Lab Results   Component Value Date    IRON 55 12/23/2015    TIBC 213.1 (L) 12/23/2015    FERRITIN 588.0 (H) 12/23/2015     Lab Results   Component Value Date    Iron Saturation (%) 26 12/23/2015    Iron Saturation (%) 26 04/29/2011     Prior ESA use: none post transplant    Plan: stable. Continue to monitor.     DM:   Lab Results   Component Value Date    A1C 4.6 (L) 03/09/2019   . Goal A1c < 7  History of Dm? No*  Plan:  Continue to monitor    Fluid Intake: 80 oz daily  Plan: Continue adequate fluid intake    Electrolytes: mag 1.2 on 12/28, calcium 7.8  Meds currently on: Sensipar 60 mg daily  Plan: start mg plus protein 266 mg BID.  Stop Sensipar. Continue to monitor     GI/BM: pt reports food related diarrhea only  Meds currently on: docusate PRN (not using), Miralax PRN (not using), famotidine 20 mg daily  Plan: Consider stopping H2RA at next visit. Continue to monitor    Pain:  pt reports moderate pain at incision site  Meds currently on: APAP PRN (using couple times per day), tramadol PRN (using HS), gabapentin per protocol  Plan: Encouraged APAP use prior to tramadol. Continue to monitor    Bone health:   Vitamin D Level: pending. Goal > 30.   Last DEXA results:  none available  Current meds include: none  Plan: Vitamin D level  pending. Continue to monitor.     Women's/Men's Health:  Mendy Chou is a 28 y.o. Female of childbearing age. Patient reports no men's/women's health issues  Plan: Continue to monitor    Seizure history - last seizure while hospitalized in 2014  Meds currently on: Keppra 250 mg BID  Plan: continue to monitor    Pharmacy preference:  [facility]    Adherence: Patient has average understanding of medications; was able to independently identify names/doses of immunosuppressants and OI meds.  Patient  does fill their own pill box on a regular basis at home.  Patient brought medication card:yes  Pill box:was correct  Plan: provided extensive adherence counseling/intervention    Spent approximately 40 minutes on educating this patient and greater than 50% was spent in direct face to face counseling regarding post transplant medication education. Questions and concerns were address to patient's satisfaction.    Patient was reviewed with Dr. Norma Fredrickson who was agreement with the stated plan:     During this visit, the following was completed:   BG log data assessment  BP log data assessment  Labs ordered and evaluated  complex treatment plan >1 DS   Patient education was completed for 11-24 minutes     All questions/concerns were addressed to the patient's satisfaction.  __________________________________________  Cleone Slim, PHARMD, CPP  [employer] TRANSPLANT CLINICAL PHARMACIST PRACTITIONER  PAGER [phone]

## 2019-03-24 LAB — BK VIRUS QUANTITATIVE PCR, BLOOD

## 2019-03-24 LAB — CMV DNA, QUANTITATIVE, PCR

## 2019-03-24 LAB — BK BLOOD QUANT: Lab: 0

## 2019-03-24 LAB — CMV QUANT LOG10: Lab: 0

## 2019-03-24 MED ORDER — SULFAMETHOXAZOLE 400 MG-TRIMETHOPRIM 80 MG TABLET
ORAL_TABLET | ORAL | 5 refills | 28.00000 days | Status: CP
Start: 2019-03-24 — End: 2019-09-20
  Filled 2019-04-04: qty 12, 28d supply, fill #0

## 2019-03-24 NOTE — Unmapped (Signed)
Called patient and instructed her to increase Envarsus to 6 mg daily.  She verbalized understanding.

## 2019-03-26 LAB — VITAMIN D, TOTAL (25OH): Lab: 10.9 — ABNORMAL LOW

## 2019-03-27 DIAGNOSIS — D899 Disorder involving the immune mechanism, unspecified: Principal | ICD-10-CM

## 2019-03-27 DIAGNOSIS — Z940 Kidney transplant status: Principal | ICD-10-CM

## 2019-03-27 LAB — CBC W/ DIFFERENTIAL
BANDED NEUTROPHILS ABSOLUTE COUNT: 0 10*3/uL (ref 0.0–0.1)
BASOPHILS ABSOLUTE COUNT: 0 10*3/uL (ref 0.0–0.2)
BASOPHILS RELATIVE PERCENT: 1 %
EOSINOPHILS ABSOLUTE COUNT: 0 10*3/uL (ref 0.0–0.4)
EOSINOPHILS RELATIVE PERCENT: 0 %
HEMATOCRIT: 31 % — ABNORMAL LOW (ref 34.0–46.6)
HEMOGLOBIN: 10.3 g/dL — ABNORMAL LOW (ref 11.1–15.9)
IMMATURE GRANULOCYTES: 1 %
LYMPHOCYTES ABSOLUTE COUNT: 0.1 10*3/uL — ABNORMAL LOW (ref 0.7–3.1)
LYMPHOCYTES RELATIVE PERCENT: 1 %
MEAN CORPUSCULAR HEMOGLOBIN CONC: 33.2 g/dL (ref 31.5–35.7)
MEAN CORPUSCULAR HEMOGLOBIN: 33.4 pg — ABNORMAL HIGH (ref 26.6–33.0)
MEAN CORPUSCULAR VOLUME: 101 fL — ABNORMAL HIGH (ref 79–97)
MONOCYTES ABSOLUTE COUNT: 0.3 10*3/uL (ref 0.1–0.9)
MONOCYTES RELATIVE PERCENT: 10 %
NEUTROPHILS ABSOLUTE COUNT: 3.1 10*3/uL (ref 1.4–7.0)
PLATELET COUNT: 248 10*3/uL (ref 150–450)
RED BLOOD CELL COUNT: 3.08 x10E6/uL — ABNORMAL LOW (ref 3.77–5.28)
RED CELL DISTRIBUTION WIDTH: 13.8 % (ref 11.7–15.4)
WHITE BLOOD CELL COUNT: 3.5 10*3/uL (ref 3.4–10.8)

## 2019-03-27 LAB — RENAL FUNCTION PANEL
BUN / CREAT RATIO: 10 (ref 9–23)
CALCIUM: 7.9 mg/dL — ABNORMAL LOW (ref 8.7–10.2)
CHLORIDE: 102 mmol/L (ref 96–106)
CO2: 22 mmol/L (ref 20–29)
CREATININE: 0.8 mg/dL (ref 0.57–1.00)
GFR MDRD AF AMER: 115 mL/min/{1.73_m2}
GFR MDRD NON AF AMER: 100 mL/min/{1.73_m2}
GLUCOSE: 84 mg/dL (ref 65–99)
PHOSPHORUS, SERUM: 3.9 mg/dL (ref 3.0–4.3)
POTASSIUM: 4.5 mmol/L (ref 3.5–5.2)
SODIUM: 139 mmol/L (ref 134–144)

## 2019-03-27 LAB — HEMOGLOBIN: Hemoglobin:MCnc:Pt:Bld:Qn:: 10.3 — ABNORMAL LOW

## 2019-03-27 LAB — CREATININE: Creatinine:MCnc:Pt:Ser/Plas:Qn:: 0.8

## 2019-03-27 LAB — MAGNESIUM: Magnesium:MCnc:Pt:Ser/Plas:Qn:: 1.2 — ABNORMAL LOW

## 2019-03-27 LAB — TACROLIMUS BLOOD: Tacrolimus:MCnc:Pt:Bld:Qn:LC/MS/MS: 8.8

## 2019-03-27 NOTE — Unmapped (Signed)
Nutrition Telephone Call     Pt called office phone and asked several food safety questions. Pt was wondering about botulism and safety of canned foods. She also had questions about leftovers and appropriate temperatures of processed meats. Pt has read the post transplant food safety booklet several times since discharge, and wanted to clarify questions she had accumulated over the holidays. All questions were answered appropriately, and pt reported understanding.     Lanelle Bal, RD, LDN  Abdominal Transplant Dietitian   Pager: [phone]

## 2019-03-28 DIAGNOSIS — D899 Disorder involving the immune mechanism, unspecified: Principal | ICD-10-CM

## 2019-03-28 DIAGNOSIS — Z940 Kidney transplant status: Principal | ICD-10-CM

## 2019-03-28 NOTE — Unmapped (Signed)
Envarsus dose change  Last filled on 03/13/19 at [facility]  Copay = $3/each

## 2019-03-30 DIAGNOSIS — Z940 Kidney transplant status: Principal | ICD-10-CM

## 2019-03-30 DIAGNOSIS — D899 Disorder involving the immune mechanism, unspecified: Principal | ICD-10-CM

## 2019-03-30 LAB — CBC W/ DIFFERENTIAL
BANDED NEUTROPHILS ABSOLUTE COUNT: 0 10*3/uL (ref 0.0–0.1)
BASOPHILS ABSOLUTE COUNT: 0 10*3/uL (ref 0.0–0.2)
BASOPHILS RELATIVE PERCENT: 1 %
EOSINOPHILS ABSOLUTE COUNT: 0 10*3/uL (ref 0.0–0.4)
EOSINOPHILS RELATIVE PERCENT: 1 %
HEMATOCRIT: 31.6 % — ABNORMAL LOW (ref 34.0–46.6)
HEMOGLOBIN: 10.2 g/dL — ABNORMAL LOW (ref 11.1–15.9)
LYMPHOCYTES ABSOLUTE COUNT: 0.1 10*3/uL — ABNORMAL LOW (ref 0.7–3.1)
LYMPHOCYTES RELATIVE PERCENT: 2 %
MEAN CORPUSCULAR HEMOGLOBIN CONC: 32.3 g/dL (ref 31.5–35.7)
MEAN CORPUSCULAR HEMOGLOBIN: 32.6 pg (ref 26.6–33.0)
MEAN CORPUSCULAR VOLUME: 101 fL — ABNORMAL HIGH (ref 79–97)
MONOCYTES ABSOLUTE COUNT: 0.4 10*3/uL (ref 0.1–0.9)
MONOCYTES RELATIVE PERCENT: 10 %
PLATELET COUNT: 368 10*3/uL (ref 150–450)
RED BLOOD CELL COUNT: 3.13 x10E6/uL — ABNORMAL LOW (ref 3.77–5.28)
RED CELL DISTRIBUTION WIDTH: 14 % (ref 11.7–15.4)
WHITE BLOOD CELL COUNT: 4.4 10*3/uL (ref 3.4–10.8)

## 2019-03-30 LAB — BASOPHILS ABSOLUTE COUNT: Basophils:NCnc:Pt:Bld:Qn:Automated count: 0

## 2019-03-30 LAB — RENAL FUNCTION PANEL
BLOOD UREA NITROGEN: 15 mg/dL (ref 6–20)
BUN / CREAT RATIO: 19 (ref 9–23)
CALCIUM: 9.6 mg/dL (ref 8.7–10.2)
CHLORIDE: 104 mmol/L (ref 96–106)
CREATININE: 0.78 mg/dL (ref 0.57–1.00)
GFR MDRD AF AMER: 119 mL/min/{1.73_m2}
GFR MDRD NON AF AMER: 103 mL/min/{1.73_m2}
GLUCOSE: 90 mg/dL (ref 65–99)
PHOSPHORUS, SERUM: 4.3 mg/dL (ref 3.0–4.3)
POTASSIUM: 4.8 mmol/L (ref 3.5–5.2)
SODIUM: 138 mmol/L (ref 134–144)

## 2019-03-30 LAB — PHOSPHORUS, SERUM: Phosphate:MCnc:Pt:Ser/Plas:Qn:: 4.3

## 2019-03-30 LAB — MAGNESIUM: Magnesium:MCnc:Pt:Ser/Plas:Qn:: 1.6

## 2019-03-30 LAB — TACROLIMUS BLOOD: Tacrolimus:MCnc:Pt:Bld:Qn:LC/MS/MS: 6.9

## 2019-04-01 LAB — RENAL FUNCTION PANEL
ALBUMIN: 4.1 g/dL (ref 3.9–5.0)
BUN / CREAT RATIO: 26 — ABNORMAL HIGH (ref 9–23)
CALCIUM: 10.1 mg/dL (ref 8.7–10.2)
CHLORIDE: 104 mmol/L (ref 96–106)
CO2: 23 mmol/L (ref 20–29)
CREATININE: 0.82 mg/dL (ref 0.57–1.00)
GFR MDRD NON AF AMER: 97 mL/min/{1.73_m2}
GLUCOSE: 87 mg/dL (ref 65–99)
PHOSPHORUS, SERUM: 4.3 mg/dL (ref 3.0–4.3)
POTASSIUM: 5.1 mmol/L (ref 3.5–5.2)
SODIUM: 136 mmol/L (ref 134–144)

## 2019-04-01 LAB — CBC W/ DIFFERENTIAL
BANDED NEUTROPHILS ABSOLUTE COUNT: 0 10*3/uL (ref 0.0–0.1)
BASOPHILS ABSOLUTE COUNT: 0.1 10*3/uL (ref 0.0–0.2)
BASOPHILS RELATIVE PERCENT: 2 %
EOSINOPHILS ABSOLUTE COUNT: 0 10*3/uL (ref 0.0–0.4)
EOSINOPHILS RELATIVE PERCENT: 1 %
HEMATOCRIT: 32.3 % — ABNORMAL LOW (ref 34.0–46.6)
HEMOGLOBIN: 10.4 g/dL — ABNORMAL LOW (ref 11.1–15.9)
IMMATURE GRANULOCYTES: 1 %
LYMPHOCYTES ABSOLUTE COUNT: 0.1 10*3/uL — ABNORMAL LOW (ref 0.7–3.1)
LYMPHOCYTES RELATIVE PERCENT: 2 %
MEAN CORPUSCULAR HEMOGLOBIN CONC: 32.2 g/dL (ref 31.5–35.7)
MEAN CORPUSCULAR HEMOGLOBIN: 32.3 pg (ref 26.6–33.0)
MONOCYTES ABSOLUTE COUNT: 0.4 10*3/uL (ref 0.1–0.9)
MONOCYTES RELATIVE PERCENT: 12 %
NEUTROPHILS RELATIVE PERCENT: 82 %
PLATELET COUNT: 375 10*3/uL (ref 150–450)
RED CELL DISTRIBUTION WIDTH: 13.7 % (ref 11.7–15.4)
WHITE BLOOD CELL COUNT: 3 10*3/uL — ABNORMAL LOW (ref 3.4–10.8)

## 2019-04-01 LAB — ALBUMIN: Albumin:MCnc:Pt:Ser/Plas:Qn:: 4.1

## 2019-04-01 LAB — WHITE BLOOD CELL COUNT: Leukocytes:NCnc:Pt:Bld:Qn:Automated count: 3 — ABNORMAL LOW

## 2019-04-01 LAB — MAGNESIUM: Magnesium:MCnc:Pt:Ser/Plas:Qn:: 1.7

## 2019-04-02 NOTE — Unmapped (Signed)
Patient paged on call nurse at 1220 and 1346.  There was a delay in calling patient back due to other on call duties.  When I called the patient at 1356, she stated I was just looking at my labs and noticed [it] going up (referring to her Creatinine) and is there something I'm doing wrong or should be doing?  When I clarified that she was talking about her creatinine going from 0.78 to 0.82, I explained that it is not a significant change.  When asked, she stated she was drinking water and fluids, taking her medications and has NO symptoms, feels fine.  I then explained that the on call nurse if for urgent matter and that for routine questions, she can wait to speak with her primary coordinator M-F, during normal business hours.  I again asked if there was anything wrong or anything else I could help her with and she asked my name again, which I provided.

## 2019-04-03 DIAGNOSIS — Z940 Kidney transplant status: Principal | ICD-10-CM

## 2019-04-03 DIAGNOSIS — D899 Disorder involving the immune mechanism, unspecified: Principal | ICD-10-CM

## 2019-04-03 LAB — BASOPHILS ABSOLUTE COUNT: Basophils:NCnc:Pt:Bld:Qn:Automated count: 0.1

## 2019-04-03 LAB — RENAL FUNCTION PANEL
BLOOD UREA NITROGEN: 14 mg/dL (ref 6–20)
BUN / CREAT RATIO: 20 (ref 9–23)
CALCIUM: 9.8 mg/dL (ref 8.7–10.2)
CHLORIDE: 102 mmol/L (ref 96–106)
CO2: 22 mmol/L (ref 20–29)
CREATININE: 0.71 mg/dL (ref 0.57–1.00)
GFR MDRD AF AMER: 133 mL/min/{1.73_m2}
GFR MDRD NON AF AMER: 115 mL/min/{1.73_m2}
PHOSPHORUS, SERUM: 4.1 mg/dL (ref 3.0–4.3)
SODIUM: 136 mmol/L (ref 134–144)

## 2019-04-03 LAB — CBC W/ DIFFERENTIAL
BANDED NEUTROPHILS ABSOLUTE COUNT: 0 10*3/uL (ref 0.0–0.1)
BASOPHILS RELATIVE PERCENT: 2 %
EOSINOPHILS ABSOLUTE COUNT: 0 10*3/uL (ref 0.0–0.4)
EOSINOPHILS RELATIVE PERCENT: 1 %
HEMATOCRIT: 31.9 % — ABNORMAL LOW (ref 34.0–46.6)
HEMOGLOBIN: 10.5 g/dL — ABNORMAL LOW (ref 11.1–15.9)
IMMATURE GRANULOCYTES: 0 %
LYMPHOCYTES ABSOLUTE COUNT: 0.1 10*3/uL — ABNORMAL LOW (ref 0.7–3.1)
LYMPHOCYTES RELATIVE PERCENT: 2 %
MEAN CORPUSCULAR HEMOGLOBIN CONC: 32.9 g/dL (ref 31.5–35.7)
MEAN CORPUSCULAR HEMOGLOBIN: 33.3 pg — ABNORMAL HIGH (ref 26.6–33.0)
MEAN CORPUSCULAR VOLUME: 101 fL — ABNORMAL HIGH (ref 79–97)
MONOCYTES ABSOLUTE COUNT: 0.3 10*3/uL (ref 0.1–0.9)
NEUTROPHILS ABSOLUTE COUNT: 2.7 10*3/uL (ref 1.4–7.0)
NEUTROPHILS RELATIVE PERCENT: 86 %
RED BLOOD CELL COUNT: 3.15 x10E6/uL — ABNORMAL LOW (ref 3.77–5.28)
RED CELL DISTRIBUTION WIDTH: 13.6 % (ref 11.7–15.4)
WHITE BLOOD CELL COUNT: 3.1 10*3/uL — ABNORMAL LOW (ref 3.4–10.8)

## 2019-04-03 LAB — TACROLIMUS BLOOD
Tacrolimus:MCnc:Pt:Bld:Qn:LC/MS/MS: 8.1
Tacrolimus:MCnc:Pt:Bld:Qn:LC/MS/MS: 9.1

## 2019-04-03 LAB — MAGNESIUM: Magnesium:MCnc:Pt:Ser/Plas:Qn:: 1.7

## 2019-04-03 LAB — CREATININE: Creatinine:MCnc:Pt:Ser/Plas:Qn:: 0.71

## 2019-04-03 NOTE — Unmapped (Signed)
Nutrition Note     Pt called this Clinical research associate to ask nutrition questions. Pt asked if she should peel the skin off of potatoes and other vegetables. Recommended she continue to peel fruits and vegetables.   Pt also asked about magnesium content several different foods, as she is trying to get magnesium level up. Pt reports eating nuts and taking a magnesium supplement. All questions answered to pt's satisfaction.     Lanelle Bal, RD, LDN  Abdominal Transplant Dietitian   Pager: [phone]

## 2019-04-04 DIAGNOSIS — D899 Disorder involving the immune mechanism, unspecified: Principal | ICD-10-CM

## 2019-04-04 DIAGNOSIS — Z940 Kidney transplant status: Principal | ICD-10-CM

## 2019-04-04 LAB — IMMATURE GRANULOCYTES: Granulocytes.immature/100 leukocytes:NFr:Pt:Bld:Qn:Automated count: 0

## 2019-04-04 LAB — CBC W/ DIFFERENTIAL
BANDED NEUTROPHILS ABSOLUTE COUNT: 0 10*3/uL (ref 0.0–0.1)
BASOPHILS ABSOLUTE COUNT: 0 10*3/uL (ref 0.0–0.2)
BASOPHILS RELATIVE PERCENT: 1 %
EOSINOPHILS ABSOLUTE COUNT: 0 10*3/uL (ref 0.0–0.4)
HEMATOCRIT: 30.6 % — ABNORMAL LOW (ref 34.0–46.6)
IMMATURE GRANULOCYTES: 0 %
LYMPHOCYTES ABSOLUTE COUNT: 0.1 10*3/uL — ABNORMAL LOW (ref 0.7–3.1)
LYMPHOCYTES RELATIVE PERCENT: 2 %
MEAN CORPUSCULAR HEMOGLOBIN CONC: 33.7 g/dL (ref 31.5–35.7)
MEAN CORPUSCULAR HEMOGLOBIN: 33.2 pg — ABNORMAL HIGH (ref 26.6–33.0)
MEAN CORPUSCULAR VOLUME: 99 fL — ABNORMAL HIGH (ref 79–97)
MONOCYTES ABSOLUTE COUNT: 0.4 10*3/uL (ref 0.1–0.9)
MONOCYTES RELATIVE PERCENT: 12 %
NEUTROPHILS ABSOLUTE COUNT: 2.6 10*3/uL (ref 1.4–7.0)
NEUTROPHILS RELATIVE PERCENT: 84 %
PLATELET COUNT: 344 10*3/uL (ref 150–450)
RED BLOOD CELL COUNT: 3.1 x10E6/uL — ABNORMAL LOW (ref 3.77–5.28)
RED CELL DISTRIBUTION WIDTH: 13.4 % (ref 11.7–15.4)
WHITE BLOOD CELL COUNT: 3.1 10*3/uL — ABNORMAL LOW (ref 3.4–10.8)

## 2019-04-04 LAB — RENAL FUNCTION PANEL
ALBUMIN: 4 g/dL (ref 3.9–5.0)
BLOOD UREA NITROGEN: 18 mg/dL (ref 6–20)
CALCIUM: 9.7 mg/dL (ref 8.7–10.2)
CHLORIDE: 104 mmol/L (ref 96–106)
CREATININE: 0.72 mg/dL (ref 0.57–1.00)
GFR MDRD NON AF AMER: 114 mL/min/{1.73_m2}
PHOSPHORUS, SERUM: 4.2 mg/dL (ref 3.0–4.3)
POTASSIUM: 4.7 mmol/L (ref 3.5–5.2)
SODIUM: 136 mmol/L (ref 134–144)

## 2019-04-04 LAB — BLOOD UREA NITROGEN: Urea nitrogen:MCnc:Pt:Ser/Plas:Qn:: 18

## 2019-04-04 LAB — MAGNESIUM: Magnesium:MCnc:Pt:Ser/Plas:Qn:: 1.7

## 2019-04-04 MED FILL — ASPIRIN 81 MG TABLET,DELAYED RELEASE: 90 days supply | Qty: 90 | Fill #0 | Status: AC

## 2019-04-04 MED FILL — FAMOTIDINE 20 MG TABLET: 30 days supply | Qty: 30 | Fill #0 | Status: AC

## 2019-04-04 MED FILL — SULFAMETHOXAZOLE 400 MG-TRIMETHOPRIM 80 MG TABLET: 28 days supply | Qty: 12 | Fill #0 | Status: AC

## 2019-04-04 MED FILL — MG-PLUS-PROTEIN 133 MG TABLET: 30 days supply | Qty: 120 | Fill #0 | Status: AC

## 2019-04-04 NOTE — Unmapped (Signed)
Called patient to go over labs.    Let her know her labs were good. Says that she noticed some blood in her urine yesterday. Denies fever, burning, pain, frequency, or odor. States that urine has been yellow since.    Will check urine with labs tomorrow.    Denies any other needs

## 2019-04-05 LAB — TACROLIMUS BLOOD: Tacrolimus:MCnc:Pt:Bld:Qn:LC/MS/MS: 8.9

## 2019-04-05 NOTE — Unmapped (Signed)
[facility] Clinical Assessment & Refill Coordination Note    Carolyn Kelley, DOB: [birth year]  Phone: [phone] (home)     All above HIPAA information was verified with patient.     Was a Nurse, learning disability used for this call? No    Specialty Medication(s):   Transplant: Envarsus 1mg , Envarsus 4mg ,  mycophenolic acid 180mg  and valgancyclovir 450mg      Current Outpatient Medications   Medication Sig Dispense Refill   ??? acetaminophen (TYLENOL) 500 MG tablet Take 1-2 tablets (500-1,000 mg total) by mouth every six (6) hours as needed for pain or fever (> 38C). 100 tablet 0   ??? aspirin (ECOTRIN) 81 MG tablet Take 1 tablet (81 mg total) by mouth daily. 30 tablet 11   ??? clonazePAM (KLONOPIN) 0.5 MG tablet Take 1 tablet (0.5 mg total) by mouth Two (2) times a day. 60 tablet 5   ??? docusate sodium (COLACE) 100 MG capsule Take 1 capsule (100 mg total) by mouth two (2) times a day as needed for constipation. 60 capsule 0   ??? famotidine (PEPCID) 20 MG tablet Take 1 tablet (20 mg total) by mouth daily. 30 tablet 11   ??? levETIRAcetam (KEPPRA) 250 MG tablet Take 1 tablet (250 mg total) by mouth Two (2) times a day. 180 tablet 3   ??? magnesium oxide-Mg AA chelate (MAGNESIUM, AMINO ACID CHELATE,) 133 mg Tab Take 2 tablets by mouth Two (2) times a day. 120 tablet 11   ??? mycophenolate (MYFORTIC) 180 MG EC tablet Take 3 tablets (540 mg total) by mouth Two (2) times a day. Adjust dose per medication card. 180 tablet 11   ??? polyethylene glycol (MIRALAX) 17 gram packet Mix 1 packet (17 g) in 8oz of water, tea, coffee, or juice and drink by mouth daily as needed (constipation). 30 each 0   ??? sulfamethoxazole-trimethoprim (BACTRIM) 400-80 mg per tablet Take 1 tablet (80 mg of trimethoprim total) by mouth Every Monday, Wednesday, and Friday. 12 tablet 5 ??? tacrolimus (ENVARSUS XR) 1 mg Tb24 extended release tablet Take 2 tablets (2 mg total) by mouth daily. Take in addition to 4 mg tablet for total dose of 6 mg daily 60 tablet 11   ??? tacrolimus (ENVARSUS XR) 4 mg Tb24 extended release tablet Take 1 tablet (4 mg total) by mouth daily. Take in addition to 1 mg tablets for total daily dose of 6mg . 30 tablet 11   ??? traMADoL (ULTRAM) 50 mg tablet Take 1-2 tablets (50-100 mg total) by mouth every twelve (12) hours as needed for pain. 30 tablet 0   ??? valGANciclovir (VALCYTE) 450 mg tablet Take 1 tablet (450 mg total) by mouth daily. 30 tablet 2     No current facility-administered medications for this visit.         Changes to medications: Kateri reports no changes at this time.    Allergies   Allergen Reactions   ??? Pollen Extracts Swelling       Changes to allergies: No    SPECIALTY MEDICATION ADHERENCE     Envarsus Xr 1 mg: 14 days of medicine on hand   Envarsus Xr 4 mg: 20 days of medicine on hand   Mycophenolate 180 mg: 3 days of medicine on hand   Valganciclovir 450 mg: 3 days of medicine on hand       Medication Adherence    Patient reported X missed doses in the last month: 0  Specialty Medication: Envarsus Xr 1mg   Patient  is on additional specialty medications: Yes  Additional Specialty Medications: Envarsus Xr 4mg   Patient Reported Additional Medication X Missed Doses in the Last Month: 0  Patient is on more than two specialty medications: Yes  Specialty Medication: Mycophenolate 180mg   Patient Reported Additional Medication X Missed Doses in the Last Month: 0  Specialty Medication: Valganciclovir 450mg   Patient Reported Additional Medication X Missed Doses in the Last Month: 0          Specialty medication(s) dose(s) confirmed: Regimen is correct and unchanged.     Are there any concerns with adherence? No    Adherence counseling provided? Not needed    CLINICAL MANAGEMENT AND INTERVENTION      Clinical Benefit Assessment: Do you feel the medicine is effective or helping your condition? Yes    Clinical Benefit counseling provided? Not needed    Adverse Effects Assessment:    Are you experiencing any side effects? No    Are you experiencing difficulty administering your medicine? No    Quality of Life Assessment:    How many days over the past month did your kidney transplant  keep you from your normal activities? For example, brushing your teeth or getting up in the morning. 0    Have you discussed this with your provider? Not needed    Therapy Appropriateness:    Is therapy appropriate? Yes, therapy is appropriate and should be continued    DISEASE/MEDICATION-SPECIFIC INFORMATION      N/A    PATIENT SPECIFIC NEEDS     ? Does the patient have any physical, cognitive, or cultural barriers? No    ? Is the patient high risk? Yes, patient is taking a REMS drug. Medication is dispensed in compliance with REMS program.     ? Does the patient require a Care Management Plan? No     ? Does the patient require physician intervention or other additional services (i.e. nutrition, smoking cessation, social work)? No      SHIPPING     Specialty Medication(s) to be Shipped:   Transplant:  mycophenolic acid 180mg  and valgancyclovir 450mg     Other medication(s) to be shipped: NONE   Patient declined Envarsus today.    Changes to insurance: No    Delivery Scheduled: Yes, Expected medication delivery date: 04/07/2019.     Medication will be delivered via UPS to the confirmed prescription address in [facility].    The patient will receive a drug information handout for each medication shipped and additional FDA Medication Guides as required.  Verified that patient has previously received a Conservation officer, historic buildings.    All of the patient's questions and concerns have been addressed.    Tera Helper   [facility] Specialty Pharmacist

## 2019-04-06 DIAGNOSIS — D899 Disorder involving the immune mechanism, unspecified: Principal | ICD-10-CM

## 2019-04-06 DIAGNOSIS — Z940 Kidney transplant status: Principal | ICD-10-CM

## 2019-04-06 LAB — CBC W/ DIFFERENTIAL
BANDED NEUTROPHILS ABSOLUTE COUNT: 0 10*3/uL (ref 0.0–0.1)
BASOPHILS ABSOLUTE COUNT: 0 10*3/uL (ref 0.0–0.2)
BASOPHILS RELATIVE PERCENT: 1 %
EOSINOPHILS RELATIVE PERCENT: 1 %
HEMATOCRIT: 30.6 % — ABNORMAL LOW (ref 34.0–46.6)
HEMOGLOBIN: 10.3 g/dL — ABNORMAL LOW (ref 11.1–15.9)
IMMATURE GRANULOCYTES: 0 %
LYMPHOCYTES ABSOLUTE COUNT: 0.1 10*3/uL — ABNORMAL LOW (ref 0.7–3.1)
LYMPHOCYTES RELATIVE PERCENT: 2 %
MEAN CORPUSCULAR HEMOGLOBIN CONC: 33.7 g/dL (ref 31.5–35.7)
MEAN CORPUSCULAR HEMOGLOBIN: 32.9 pg (ref 26.6–33.0)
MONOCYTES ABSOLUTE COUNT: 0.4 10*3/uL (ref 0.1–0.9)
MONOCYTES RELATIVE PERCENT: 12 %
NEUTROPHILS ABSOLUTE COUNT: 2.7 10*3/uL (ref 1.4–7.0)
NEUTROPHILS RELATIVE PERCENT: 84 %
PLATELET COUNT: 321 10*3/uL (ref 150–450)
RED BLOOD CELL COUNT: 3.13 x10E6/uL — ABNORMAL LOW (ref 3.77–5.28)
RED CELL DISTRIBUTION WIDTH: 13.4 % (ref 11.7–15.4)
WHITE BLOOD CELL COUNT: 3.2 10*3/uL — ABNORMAL LOW (ref 3.4–10.8)

## 2019-04-06 LAB — RENAL FUNCTION PANEL
BLOOD UREA NITROGEN: 18 mg/dL (ref 6–20)
BUN / CREAT RATIO: 21 (ref 9–23)
CALCIUM: 10.2 mg/dL (ref 8.7–10.2)
CHLORIDE: 105 mmol/L (ref 96–106)
CO2: 20 mmol/L (ref 20–29)
CREATININE: 0.86 mg/dL (ref 0.57–1.00)
GFR MDRD AF AMER: 106 mL/min/{1.73_m2}
GFR MDRD NON AF AMER: 92 mL/min/{1.73_m2}
PHOSPHORUS, SERUM: 3.9 mg/dL (ref 3.0–4.3)
POTASSIUM: 5 mmol/L (ref 3.5–5.2)
SODIUM: 139 mmol/L (ref 134–144)

## 2019-04-06 LAB — LEUKOCYTE ESTERASE UA: Leukocyte esterase:PrThr:Pt:Urine:Ord:Test strip: NEGATIVE

## 2019-04-06 LAB — URINALYSIS
BILIRUBIN UA: NEGATIVE
GLUCOSE UA: NEGATIVE
KETONES UA: NEGATIVE
LEUKOCYTE ESTERASE UA: NEGATIVE
NITRITE UA: NEGATIVE
PH UA: 5.5 (ref 5.0–7.5)
SPECIFIC GRAVITY UA: 1.017 (ref 1.005–1.030)

## 2019-04-06 LAB — CHLORIDE: Chloride:SCnc:Pt:Ser/Plas:Qn:: 105

## 2019-04-06 LAB — MAGNESIUM
MAGNESIUM: 1.6 mg/dL (ref 1.6–2.3)
Magnesium:MCnc:Pt:Ser/Plas:Qn:: 1.6

## 2019-04-06 LAB — BACTERIA

## 2019-04-06 LAB — MICROSCOPIC EXAMINATION: CASTS: NONE SEEN /LPF

## 2019-04-06 LAB — NEUTROPHILS RELATIVE PERCENT: Neutrophils/100 leukocytes:NFr:Pt:Bld:Qn:Automated count: 84

## 2019-04-06 MED FILL — VALGANCICLOVIR 450 MG TABLET: 30 days supply | Qty: 30 | Fill #0 | Status: AC

## 2019-04-06 MED FILL — MYCOPHENOLATE SODIUM 180 MG TABLET,DELAYED RELEASE: 30 days supply | Qty: 180 | Fill #0 | Status: AC

## 2019-04-06 MED FILL — MYCOPHENOLATE SODIUM 180 MG TABLET,DELAYED RELEASE: ORAL | 30 days supply | Qty: 180 | Fill #0

## 2019-04-06 MED FILL — VALGANCICLOVIR 450 MG TABLET: ORAL | 30 days supply | Qty: 30 | Fill #0

## 2019-04-07 NOTE — Unmapped (Signed)
Nutrition Note     Pt called this writer to asked about a mixed fruit juice and the safety of the ingredients in the fruit juice. The ingredient list did not contain grapefruit or pomegranate juice, so there is low risk of interaction with medications. Pt inquired about orange juice, which is allowed. Pt requested that this writer be added to her [facility] Team list, as she finds it easier to send MyChart messages when questions arise. Writer was added to Care Team.     Lanelle Bal, RD, LDN  Abdominal Transplant Dietitian   Pager: [phone]

## 2019-04-08 LAB — RENAL FUNCTION PANEL
ALBUMIN: 4.2 g/dL (ref 3.9–5.0)
BLOOD UREA NITROGEN: 24 mg/dL — ABNORMAL HIGH (ref 6–20)
BUN / CREAT RATIO: 34 — ABNORMAL HIGH (ref 9–23)
CALCIUM: 10.2 mg/dL (ref 8.7–10.2)
CHLORIDE: 105 mmol/L (ref 96–106)
CREATININE: 0.71 mg/dL (ref 0.57–1.00)
GFR MDRD NON AF AMER: 115 mL/min/{1.73_m2}
PHOSPHORUS, SERUM: 4.2 mg/dL (ref 3.0–4.3)
POTASSIUM: 5 mmol/L (ref 3.5–5.2)
SODIUM: 137 mmol/L (ref 134–144)

## 2019-04-08 LAB — CBC W/ DIFFERENTIAL
BANDED NEUTROPHILS ABSOLUTE COUNT: 0 10*3/uL (ref 0.0–0.1)
BASOPHILS ABSOLUTE COUNT: 0 10*3/uL (ref 0.0–0.2)
BASOPHILS RELATIVE PERCENT: 1 %
EOSINOPHILS ABSOLUTE COUNT: 0 10*3/uL (ref 0.0–0.4)
EOSINOPHILS RELATIVE PERCENT: 1 %
HEMOGLOBIN: 10.6 g/dL — ABNORMAL LOW (ref 11.1–15.9)
IMMATURE GRANULOCYTES: 1 %
LYMPHOCYTES ABSOLUTE COUNT: 0.1 10*3/uL — ABNORMAL LOW (ref 0.7–3.1)
LYMPHOCYTES RELATIVE PERCENT: 3 %
MEAN CORPUSCULAR HEMOGLOBIN CONC: 32.8 g/dL (ref 31.5–35.7)
MEAN CORPUSCULAR VOLUME: 98 fL — ABNORMAL HIGH (ref 79–97)
MONOCYTES ABSOLUTE COUNT: 0.5 10*3/uL (ref 0.1–0.9)
MONOCYTES RELATIVE PERCENT: 14 %
NEUTROPHILS ABSOLUTE COUNT: 2.8 10*3/uL (ref 1.4–7.0)
NEUTROPHILS RELATIVE PERCENT: 80 %
PLATELET COUNT: 348 10*3/uL (ref 150–450)
RED BLOOD CELL COUNT: 3.29 x10E6/uL — ABNORMAL LOW (ref 3.77–5.28)
RED CELL DISTRIBUTION WIDTH: 13.3 % (ref 11.7–15.4)
WHITE BLOOD CELL COUNT: 3.5 10*3/uL (ref 3.4–10.8)

## 2019-04-08 LAB — TACROLIMUS LEVEL: TACROLIMUS BLOOD: 7 ng/mL (ref 2.0–20.0)

## 2019-04-08 LAB — TACROLIMUS BLOOD: Tacrolimus:MCnc:Pt:Bld:Qn:LC/MS/MS: 7

## 2019-04-08 LAB — BUN / CREAT RATIO: Urea nitrogen/Creatinine:MRto:Pt:Ser/Plas:Qn:: 34 — ABNORMAL HIGH

## 2019-04-08 LAB — MEAN CORPUSCULAR HEMOGLOBIN: Erythrocyte mean corpuscular hemoglobin:EntMass:Pt:RBC:Qn:Automated count: 32.2

## 2019-04-08 LAB — MAGNESIUM: Magnesium:MCnc:Pt:Ser/Plas:Qn:: 1.8

## 2019-04-09 LAB — TACROLIMUS BLOOD: Tacrolimus:MCnc:Pt:Bld:Qn:LC/MS/MS: 7.4

## 2019-04-10 DIAGNOSIS — D899 Disorder involving the immune mechanism, unspecified: Principal | ICD-10-CM

## 2019-04-10 DIAGNOSIS — Z940 Kidney transplant status: Principal | ICD-10-CM

## 2019-04-11 DIAGNOSIS — Z940 Kidney transplant status: Principal | ICD-10-CM

## 2019-04-11 DIAGNOSIS — D899 Disorder involving the immune mechanism, unspecified: Principal | ICD-10-CM

## 2019-04-11 LAB — CBC W/ DIFFERENTIAL
BANDED NEUTROPHILS ABSOLUTE COUNT: 0 10*3/uL (ref 0.0–0.1)
BASOPHILS ABSOLUTE COUNT: 0.1 10*3/uL (ref 0.0–0.2)
BASOPHILS RELATIVE PERCENT: 1 %
EOSINOPHILS ABSOLUTE COUNT: 0.1 10*3/uL (ref 0.0–0.4)
EOSINOPHILS RELATIVE PERCENT: 1 %
HEMATOCRIT: 33.7 % — ABNORMAL LOW (ref 34.0–46.6)
HEMOGLOBIN: 10.9 g/dL — ABNORMAL LOW (ref 11.1–15.9)
IMMATURE GRANULOCYTES: 1 %
LYMPHOCYTES ABSOLUTE COUNT: 0.1 10*3/uL — ABNORMAL LOW (ref 0.7–3.1)
LYMPHOCYTES RELATIVE PERCENT: 2 %
MEAN CORPUSCULAR HEMOGLOBIN CONC: 32.3 g/dL (ref 31.5–35.7)
MEAN CORPUSCULAR HEMOGLOBIN: 32.1 pg (ref 26.6–33.0)
MONOCYTES ABSOLUTE COUNT: 0.5 10*3/uL (ref 0.1–0.9)
MONOCYTES RELATIVE PERCENT: 11 %
NEUTROPHILS ABSOLUTE COUNT: 3.4 10*3/uL (ref 1.4–7.0)
NEUTROPHILS RELATIVE PERCENT: 84 %
PLATELET COUNT: 291 10*3/uL (ref 150–450)
RED BLOOD CELL COUNT: 3.4 x10E6/uL — ABNORMAL LOW (ref 3.77–5.28)
RED CELL DISTRIBUTION WIDTH: 13.2 % (ref 11.7–15.4)
WHITE BLOOD CELL COUNT: 4.1 10*3/uL (ref 3.4–10.8)

## 2019-04-11 LAB — URINALYSIS
BILIRUBIN UA: NEGATIVE
BLOOD UA: NEGATIVE
GLUCOSE UA: NEGATIVE
LEUKOCYTE ESTERASE UA: NEGATIVE
NITRITE UA: NEGATIVE
PH UA: 5.5 (ref 5.0–7.5)
PROTEIN UA: NEGATIVE
SPECIFIC GRAVITY UA: 1.017 (ref 1.005–1.030)
UROBILINOGEN UA: 0.2 mg/dL (ref 0.2–1.0)

## 2019-04-11 LAB — RED BLOOD CELL COUNT: Erythrocytes:NCnc:Pt:Bld:Qn:Automated count: 3.4 — ABNORMAL LOW

## 2019-04-11 LAB — PROTEIN / CREATININE RATIO, URINE: PROTEIN URINE: 10.3 mg/dL

## 2019-04-11 LAB — PROTEIN/CREAT RATIO: Protein/Creatinine:MRto:Pt:Urine:Qn:: 145

## 2019-04-11 LAB — RENAL FUNCTION PANEL
BUN / CREAT RATIO: 22 (ref 9–23)
CALCIUM: 10.1 mg/dL (ref 8.7–10.2)
CHLORIDE: 108 mmol/L — ABNORMAL HIGH (ref 96–106)
CO2: 22 mmol/L (ref 20–29)
CREATININE: 0.65 mg/dL (ref 0.57–1.00)
GFR MDRD AF AMER: 139 mL/min/{1.73_m2}
GFR MDRD NON AF AMER: 120 mL/min/{1.73_m2}
GLUCOSE: 86 mg/dL (ref 65–99)
PHOSPHORUS, SERUM: 3.8 mg/dL (ref 3.0–4.3)
POTASSIUM: 4.9 mmol/L (ref 3.5–5.2)
SODIUM: 139 mmol/L (ref 134–144)

## 2019-04-11 LAB — BACTERIA

## 2019-04-11 LAB — MICROSCOPIC EXAMINATION: CASTS: NONE SEEN /LPF

## 2019-04-11 LAB — MICROSCOPIC EXAM

## 2019-04-11 LAB — MAGNESIUM: Magnesium:MCnc:Pt:Ser/Plas:Qn:: 1.8

## 2019-04-11 LAB — CO2: Carbon dioxide:SCnc:Pt:Ser/Plas:Qn:: 22

## 2019-04-11 NOTE — Unmapped (Signed)
After reviewing labs from 1-15 to include Tacrolimus trough of 7.4 with [name], patient Envarsus level to   increase to 7 mg daily.     Updated Rx for [facility]. Left VM with detailed instructions of the increase, to increase by 1 mg starting 1-20.   Sent patient My chart message requesting a confirmation on the change.     Drue Flirt, RN Transplant Nurse Coordinator 04/11/2019 3:33 PM

## 2019-04-12 LAB — TACROLIMUS BLOOD: Tacrolimus:MCnc:Pt:Bld:Qn:LC/MS/MS: 6.8

## 2019-04-12 MED ORDER — TACROLIMUS ER 4 MG TABLET,EXTENDED RELEASE 24 HR
ORAL_TABLET | Freq: Every day | ORAL | 11 refills | 30 days | Status: CP
Start: 2019-04-12 — End: 2020-04-11
  Filled 2019-04-17: qty 90, 30d supply, fill #0

## 2019-04-12 MED ORDER — TACROLIMUS ER 1 MG TABLET,EXTENDED RELEASE 24 HR
ORAL_TABLET | Freq: Every day | ORAL | 11 refills | 30 days | Status: CP
Start: 2019-04-12 — End: 2020-04-11

## 2019-04-12 NOTE — Unmapped (Signed)
Change in Envarsus dosage decrease. Co-pay $3.00.

## 2019-04-12 NOTE — Unmapped (Signed)
[facility] Refill Coordination Note    Specialty Medication(s) to be Shipped:   Transplant: Envarsus 1mg  and Envarsus 4mg     Other medication(s) to be shipped: clonazepam 0.5mg      Carolyn Kelley, DOB: July 14, 1990  Phone: [phone] (home)       All above HIPAA information was verified with patient.     Was a Nurse, learning disability used for this call? No    Completed refill call assessment today to schedule patient's medication shipment from the [facility] [phone]).       Specialty medication(s) and dose(s) confirmed: Patient reports changes to the regimen as follows: Envarsus 7mg  daily **new rx's are on profile**  Changes to medications: Rylah reports no changes at this time.  Changes to insurance: No  Questions for the pharmacist: No    Confirmed patient received Welcome Packet with first shipment. The patient will receive a drug information handout for each medication shipped and additional FDA Medication Guides as required.       DISEASE/MEDICATION-SPECIFIC INFORMATION        N/A    SPECIALTY MEDICATION ADHERENCE     Medication Adherence    Patient reported X missed doses in the last month: 0  Specialty Medication: Envarsus 1mg   Patient is on additional specialty medications: Yes  Additional Specialty Medications: Envarsus 4mg   Patient Reported Additional Medication X Missed Doses in the Last Month: 0  Patient is on more than two specialty medications: No          Envarsus 1 mg: unknown days of medicine on hand   Envarsus 4 mg: unknown days of medicine on hand     SHIPPING     Shipping address confirmed in Epic.     Delivery Scheduled: Yes, Expected medication delivery date: 04/18/2019.     Medication will be delivered via UPS to the prescription address in Epic [facility].    [name] [facility] Specialty Technician

## 2019-04-13 DIAGNOSIS — D899 Disorder involving the immune mechanism, unspecified: Principal | ICD-10-CM

## 2019-04-13 DIAGNOSIS — Z940 Kidney transplant status: Principal | ICD-10-CM

## 2019-04-13 LAB — RENAL FUNCTION PANEL
ALBUMIN: 4.2 g/dL (ref 3.9–5.0)
BLOOD UREA NITROGEN: 13 mg/dL (ref 6–20)
BUN / CREAT RATIO: 18 (ref 9–23)
CALCIUM: 10 mg/dL (ref 8.7–10.2)
CHLORIDE: 104 mmol/L (ref 96–106)
CREATININE: 0.72 mg/dL (ref 0.57–1.00)
GFR MDRD NON AF AMER: 114 mL/min/{1.73_m2}
GLUCOSE: 83 mg/dL (ref 65–99)
PHOSPHORUS, SERUM: 4.4 mg/dL — ABNORMAL HIGH (ref 3.0–4.3)
POTASSIUM: 4.7 mmol/L (ref 3.5–5.2)

## 2019-04-13 LAB — MAGNESIUM: Magnesium:MCnc:Pt:Ser/Plas:Qn:: 1.7

## 2019-04-13 LAB — CBC W/ DIFFERENTIAL
BANDED NEUTROPHILS ABSOLUTE COUNT: 0 10*3/uL (ref 0.0–0.1)
BASOPHILS ABSOLUTE COUNT: 0 10*3/uL (ref 0.0–0.2)
BASOPHILS RELATIVE PERCENT: 1 %
EOSINOPHILS ABSOLUTE COUNT: 0 10*3/uL (ref 0.0–0.4)
EOSINOPHILS RELATIVE PERCENT: 1 %
HEMATOCRIT: 32.7 % — ABNORMAL LOW (ref 34.0–46.6)
HEMOGLOBIN: 10.8 g/dL — ABNORMAL LOW (ref 11.1–15.9)
LYMPHOCYTES ABSOLUTE COUNT: 0.1 10*3/uL — ABNORMAL LOW (ref 0.7–3.1)
LYMPHOCYTES RELATIVE PERCENT: 2 %
MEAN CORPUSCULAR HEMOGLOBIN CONC: 33 g/dL (ref 31.5–35.7)
MEAN CORPUSCULAR HEMOGLOBIN: 32.4 pg (ref 26.6–33.0)
MEAN CORPUSCULAR VOLUME: 98 fL — ABNORMAL HIGH (ref 79–97)
MONOCYTES RELATIVE PERCENT: 10 %
NEUTROPHILS RELATIVE PERCENT: 86 %
PLATELET COUNT: 274 10*3/uL (ref 150–450)
RED BLOOD CELL COUNT: 3.33 x10E6/uL — ABNORMAL LOW (ref 3.77–5.28)
RED CELL DISTRIBUTION WIDTH: 13.4 % (ref 11.7–15.4)
WHITE BLOOD CELL COUNT: 3.7 10*3/uL (ref 3.4–10.8)

## 2019-04-13 LAB — BASOPHILS ABSOLUTE COUNT: Basophils:NCnc:Pt:Bld:Qn:Automated count: 0

## 2019-04-13 LAB — GFR MDRD NON AF AMER
Glomerular filtration rate/1.73 sq M.predicted.non black:ArVRat:Pt:Ser/Plas/Bld:Qn:Creatinine-based formula (CKD-EPI): 114

## 2019-04-14 LAB — TACROLIMUS BLOOD: Tacrolimus:MCnc:Pt:Bld:Qn:LC/MS/MS: 10

## 2019-04-15 LAB — CBC W/ DIFFERENTIAL
BANDED NEUTROPHILS ABSOLUTE COUNT: 0 10*3/uL (ref 0.0–0.1)
BASOPHILS ABSOLUTE COUNT: 0 10*3/uL (ref 0.0–0.2)
BASOPHILS RELATIVE PERCENT: 1 %
EOSINOPHILS ABSOLUTE COUNT: 0.1 10*3/uL (ref 0.0–0.4)
EOSINOPHILS RELATIVE PERCENT: 1 %
HEMATOCRIT: 30.4 % — ABNORMAL LOW (ref 34.0–46.6)
HEMOGLOBIN: 10.2 g/dL — ABNORMAL LOW (ref 11.1–15.9)
IMMATURE GRANULOCYTES: 1 %
LYMPHOCYTES ABSOLUTE COUNT: 0.1 10*3/uL — ABNORMAL LOW (ref 0.7–3.1)
LYMPHOCYTES RELATIVE PERCENT: 3 %
MEAN CORPUSCULAR HEMOGLOBIN: 32.9 pg (ref 26.6–33.0)
MEAN CORPUSCULAR VOLUME: 98 fL — ABNORMAL HIGH (ref 79–97)
MONOCYTES ABSOLUTE COUNT: 0.4 10*3/uL (ref 0.1–0.9)
MONOCYTES RELATIVE PERCENT: 11 %
NEUTROPHILS RELATIVE PERCENT: 83 %
RED BLOOD CELL COUNT: 3.1 x10E6/uL — ABNORMAL LOW (ref 3.77–5.28)
RED CELL DISTRIBUTION WIDTH: 13.3 % (ref 11.7–15.4)
WHITE BLOOD CELL COUNT: 4 10*3/uL (ref 3.4–10.8)

## 2019-04-15 LAB — RENAL FUNCTION PANEL
BLOOD UREA NITROGEN: 13 mg/dL (ref 6–20)
BUN / CREAT RATIO: 19 (ref 9–23)
CALCIUM: 10.1 mg/dL (ref 8.7–10.2)
CHLORIDE: 107 mmol/L — ABNORMAL HIGH (ref 96–106)
CREATININE: 0.7 mg/dL (ref 0.57–1.00)
GFR MDRD AF AMER: 135 mL/min/{1.73_m2}
GFR MDRD NON AF AMER: 117 mL/min/{1.73_m2}
GLUCOSE: 88 mg/dL (ref 65–99)
SODIUM: 140 mmol/L (ref 134–144)

## 2019-04-15 LAB — PLATELET COUNT: Platelets:NCnc:Pt:Bld:Qn:Automated count: 240

## 2019-04-15 LAB — MAGNESIUM: Magnesium:MCnc:Pt:Ser/Plas:Qn:: 1.7

## 2019-04-15 LAB — CO2: Carbon dioxide:SCnc:Pt:Ser/Plas:Qn:: 21

## 2019-04-17 ENCOUNTER — Encounter: Admit: 2019-04-17 | Discharge: 2019-04-18 | Payer: PRIVATE HEALTH INSURANCE | Attending: Urology | Primary: Urology

## 2019-04-17 DIAGNOSIS — Z940 Kidney transplant status: Principal | ICD-10-CM

## 2019-04-17 DIAGNOSIS — D899 Disorder involving the immune mechanism, unspecified: Principal | ICD-10-CM

## 2019-04-17 DIAGNOSIS — Z466 Encounter for fitting and adjustment of urinary device: Principal | ICD-10-CM

## 2019-04-17 DIAGNOSIS — Z79899 Other long term (current) drug therapy: Principal | ICD-10-CM

## 2019-04-17 LAB — TACROLIMUS LEVEL: TACROLIMUS BLOOD: 9.2 ng/mL (ref 2.0–20.0)

## 2019-04-17 LAB — TACROLIMUS BLOOD: Tacrolimus:MCnc:Pt:Bld:Qn:LC/MS/MS: 9.2

## 2019-04-17 MED FILL — CLONAZEPAM 0.5 MG TABLET: 30 days supply | Qty: 60 | Fill #0 | Status: AC

## 2019-04-17 MED FILL — ENVARSUS XR 4 MG TABLET,EXTENDED RELEASE: 30 days supply | Qty: 30 | Fill #0 | Status: AC

## 2019-04-17 MED FILL — ENVARSUS XR 1 MG TABLET,EXTENDED RELEASE: 30 days supply | Qty: 90 | Fill #0 | Status: AC

## 2019-04-17 MED FILL — ENVARSUS XR 4 MG TABLET,EXTENDED RELEASE: ORAL | 30 days supply | Qty: 30 | Fill #0

## 2019-04-17 NOTE — Unmapped (Signed)
Spoke to mother about FMLA paperwork and pt needs note for school.  Let her know we can fill out tomorrow at her apt.  Verbalized udnerstanding

## 2019-04-17 NOTE — Unmapped (Signed)
[facility]:  Taking Care of Yourself After Cystoscopy Procedures    *Drink plenty of water for a day or two following your procedure.  Try to have about 8 ounces (one cup) at a time, and do this 6 times or more per day.  (If you have fluid restrictions, please ask the nurse or doctor for advice).    *AVOID alcoholic, carbonated and caffeinated drinks for a day or two, as they may cause uncomfortable symptoms.    *For the first 8 hours after the procedure, your urine may be pink or red in color.  Small clots or a few drops of blood can be a normal side effect of the instruments.  Large amounts of bleeding or difficulty urinating are not normal.  Call your doctor if this happens.    *You may experience some mild discomfort of a burning sensation with urination after having this procedure.  If it does not improve, or if other symptoms appear (fever, chills, or difficulty emptying), call your doctor.    *You may return to normal daily activities such as work, school, driving, exercising and housework.    *If your doctor gave you a prescription, take it as ordered.    *If you need a return appointment, the secretary will make it for you when you check out. To contact the Urology Clinic during business hours, call [phone].    * Hospitals Operator can be reached at [phone] if you need to get in contact with your doctor.  After the hours, the operator can page the doctor on call for urgent concerns:    Urology Patients should ask for the Urology resident “on call”.    You can get more immediate assistance at the Emergency Room or Urgent Care if necessary.

## 2019-04-17 NOTE — Unmapped (Signed)
Transplant Nephrology Clinic Visit      History of Present Illness    29 y.o. female here for follow up after kidney transplantation.  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  Patient has been on dialysis since 12/01/2011.  Patient's history includes Seizure disorder, PEA arrest with AKI in 2013, HTN, Myoclonus    Patient was admitted for kidney transplant on 03/09/2019.  Kidney transplant operation complicated by hypotension and hypoxia thought to be caused by anaphylactic reaction to Campath. She received 6 puffs of Albuterol and Epinephrine and recovered. After extubation she received another dose of Epinephrine for hypotension.  She received Albumin and Crystalloid in the OR.  Patient discharged on 03/13/19      Transplant History:    Organ Received: Left DDKT, DBD, SCD, PHS, KDPI 2%; cold ischemia 18 hrs.  Native Kidney Disease: C3 glomerulonephropathy, cPRA: 0%  Date of Transplant: 03/10/19  Post-Transplant Course: Complicated by Anaphylactic reaction to Campath in OR  Prior Transplants: None  Induction: Campath  Date of Ureteral Stent Removal: 04/17/19  CMV/EBV Status: CMV D+/R+, EBV D+/R+  Rejection Episodes: None  Donor Specific Antibodies: None  Results of Renal Imaging (pre and post):   Pre-Txp 01/18/17  Kidneys are small and echogenic bilaterally. There is a renal cyst within the right interpolar region measuring 1.4 x 1.3 x 1.1 cm, previously 0.9 x 0.9 x 0.9 cm. A second, smaller cyst in the interpolar region measures 1.1 x 1.1 x 1.1 cm. Within the left interpolar region, there is a cystic lesion with tiny peripheral echogenic focus measuring 0.7 x 0.8 x 0.7 cm. Multiple other small subcentimeter hypodensities are seen, too small to characterize. No hydronephrosis or renal calculi.     CT Renal Mass Protocol 02/25/2018  Symmetric renal enhancement. Bilateral atrophic kidneys with multiple simple cysts. Additional subcentimeter hypodense lesions are too small to characterize. No enhancing soft tissue renal mass. No hydronephrosis or calculi.    Post-Txp 03/10/19 (txp kidney only)  The renal transplant was located in the right lower quadrant. Normal size and echogenicity.  No solid masses or calculi. No perinephric collections identified. No hydronephrosis  Resistive indices in the renal transplant are stable compared with prior examination      Current Immunosuppression Regimen:   Envarsus 7 mg Daily  Myfortic 540 mg BID      Subjective/Interval:   Since discharge, patient followed up with Transplant Surgery.  Urethral stent removed 04/17/19.     Today, patient presents with her mom.  She is feeling well.  She currently uses a walker to get around and has physical therapy coming to her house to help with strength.  She is drinking about 9-10 cups of water a day along with juice and soda. Home BP running 100-115/60-70's.  She states she notices her urine may have a pink tinge to it, but that it goes away with next urine void.  She brought paperwork for our office to complete for her school. She denies headaches, dizziness/lightheadedness, chest pain, shortness of breath, fever, chills, abdominal pain, n/v/d, edema, tremors, dysuria, itching, pain/burning with urination.     Last dose of Envarsus: 11 AM    Past Medical History  1. C3 Glomerulopathy  2. Seizure Disorder  3. PEA Arrest 03/26/2011 with AKI   4. HTN  5. Myoclonus    Review of Systems    Otherwise as per HPI, all other systems reviewed and are negative.    Medications  Current Outpatient Medications   Medication  Sig Dispense Refill   ??? aspirin (ECOTRIN) 81 MG tablet Take 1 tablet (81 mg total) by mouth daily. 30 tablet 11   ??? clonazePAM (KLONOPIN) 0.5 MG tablet Take 1 tablet (0.5 mg total) by mouth Two (2) times a day. 60 tablet 5   ??? famotidine (PEPCID) 20 MG tablet Take 1 tablet (20 mg total) by mouth daily. 30 tablet 11   ??? levETIRAcetam (KEPPRA) 250 MG tablet Take 1 tablet (250 mg total) by mouth Two (2) times a day. 180 tablet 3   ??? magnesium oxide-Mg AA chelate (MAGNESIUM, AMINO ACID CHELATE,) 133 mg Tab Take 2 tablets by mouth Two (2) times a day. 120 tablet 11   ??? mycophenolate (MYFORTIC) 180 MG EC tablet Take 3 tablets (540 mg total) by mouth Two (2) times a day. Adjust dose per medication card. 180 tablet 11   ??? sulfamethoxazole-trimethoprim (BACTRIM) 400-80 mg per tablet Take 1 tablet (80 mg of trimethoprim total) by mouth Every Monday, Wednesday, and Friday. 12 tablet 5   ??? tacrolimus (ENVARSUS XR) 1 mg Tb24 extended release tablet Take 3 tablets (3 mg total) by mouth daily with 1 (4 mg) tablet for total dose of 7 mg daily 90 tablet 11   ??? tacrolimus (ENVARSUS XR) 4 mg Tb24 extended release tablet Take 1 tablet (4 mg total) by mouth daily. 30 tablet 11   ??? valGANciclovir (VALCYTE) 450 mg tablet Take 1 tablet (450 mg total) by mouth daily. 30 tablet 2   ??? acetaminophen (TYLENOL) 500 MG tablet Take 1-2 tablets (500-1,000 mg total) by mouth every six (6) hours as needed for pain or fever (> 38C). 100 tablet 0   ??? docusate sodium (COLACE) 100 MG capsule Take 1 capsule (100 mg total) by mouth two (2) times a day as needed for constipation. 60 capsule 0   ??? polyethylene glycol (MIRALAX) 17 gram packet Mix 1 packet (17 g) in 8oz of water, tea, coffee, or juice and drink by mouth daily as needed (constipation). 30 each 0   ??? traMADoL (ULTRAM) 50 mg tablet Take 1-2 tablets (50-100 mg total) by mouth every twelve (12) hours as needed for pain. 30 tablet 0     No current facility-administered medications for this visit.            Physical Exam  BP 108/76  - Pulse 96  - Temp 36.6 ??C (97.9 ??F)  - Ht 157.5 cm (5' 2)  - Wt 61.4 kg (135 lb 6.4 oz)  - LMP 03/10/2019  - BMI 24.76 kg/m??   General: no acute distress  HEENT: wearing mask, PERRL  Neck: neck supple, no cervical lymphadenopathy appreciated  CV: normal rate, normal rhythm, no murmur, no gallops, no rubs appreciated  Lungs: clear to auscultation bilaterally  Abdomen: soft, non tender, incision with staples c/d/i, without erythema, edema, or warmth  Extremities:  no edema,   Musculoskeletal: no visible deformity, normal range of motion.  Pulses: intact distally throughout  Neurologic: awake, alert, and oriented x3    Staples removed today by Transplant Nurse Coordinator.     Laboratory Data and Imaging reviewed in EPIC      Assessment:  29 y.o. 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         Recommendations/Plan:     Allograft Function: Renal function stable at 0.72 with baseline of approximately 0.6-0.8 since transplant.  No  DSA's at this time.   UPC 0.055 today.    Immunosuppression Management [High Risk Medical Decision Making For Drug Therapy Requiring Intensive Monitoring For Toxicity]: Tacrolimus trough level 6.1 today. Envarsus 7 mg daily.  No changes at this time due to stable prior levels at this dose.  Patient to get labs later this week.  Targeting tacrolimus trough levels of approximately 8-10 ng/mL.   Continue mycophenolate 540 mg BID.     Blood Pressure Management: BP 108/76 at this visit. Continue to monitor as BP stable at home without medications.      Lipid Management: Last lipid panel on 02/25/2018.  Patient is currently not on a statin.  Current The ASCVD Risk score Denman George DC Jorge Ny al., 2013) failed to calculate.    Electrolytes: Electrolyte and metabolic parameters in acceptable range.  Will continue to monitor.  Vitamin D 10.9 on 03/23/19.  Started ergocalciferol 50,000 units today.     Infectious Prophylaxis and Monitoring: CMV VL not detected 03/23/19, with today's pending.    BK VL not detected 03/23/19, with today's pending.  The patient continues on Valcyte and Bactrim prophylaxis.     Anemia of CKD: Hgb 10.5.  Last dose of Aranesp 03/07/19.     Seizures: Continue on Keppra.  Last seizure in 2014. Follows with [name], NP at [facility].  Last visit on 05/23/2018, with yearly follow ups.     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  Colposcopy on 12/21/18 showed low-grade squamous intraepithelial lesion and a small detached fragment of dysplastic epithelium, quantitatively insufficient for grading (high grade dysplasia no excluded).  Patient to repeat Pap smear in 1 year.     Myoclonus: Continue on Klonopin.  Follows with [facility].  Continue to use walker and cane. Last see on 10/19/18. F/U 1 year.      Health Maintenance:   Pap Smear: 12/08/18 showed HSIL [facility]) - Repeat 1 year  Discussed need for contraception and pregnancy prevention due to medications.  Patient informed to let us know when she is thinking about getting pregnant so we can adjust medications.     Immunizations:   Flu Shot: 12/15/18  Prevnar 13: 02/23/19  Pneumovax: 02/04/12  No immunizations till 1 year post transplant.    Counseling:  I counseled the patient on:  The need to avoid sun exposure and the use of sunblock while outdoors given the relatively higher risk of skin malignancy in an immunosuppressed state.  The need for adherence to immunosuppression medication.  Patient verbalized understanding.     Follow-Up:  Return to clinic in 4 weeks  Labs: 3 times a week  Patient will continue to follow-up with her primary care provider for non-transplant related issues and medication refills. We have ordered transplant specific labs per the center's guidelines to monitor and assess for toxicities from immunosuppressant drug therapy        Doralee Kocak L. Marina Goodell, MSN, APRN, AGNP-C - Kidney Transplant Nurse Practitioner  [facility]

## 2019-04-17 NOTE — Unmapped (Signed)
Carolyn Kelley [id number]

## 2019-04-17 NOTE — Unmapped (Signed)
Procedures Local cystoscopy with stent removal    Indications: indwelling ureteral stent following renal transplantation    Cystoscopy, ureteral stent removal  Timeout was performed immediately prior to the procedure.    The patient was prepped and draped in the usual sterile fashion.  Flexible cystoscopy was performed.  The indwelling transplant ureteral stent was visualized, grasped, and removed intact.  The patient tolerated the procedure well     Antibiotics: she will take her normal PPx this morning    Plan: Notify us or transplant coordinator with any change in GU symptoms / urine output

## 2019-04-18 ENCOUNTER — Encounter: Admit: 2019-04-18 | Discharge: 2019-04-19 | Payer: PRIVATE HEALTH INSURANCE

## 2019-04-18 ENCOUNTER — Ambulatory Visit: Admit: 2019-04-18 | Discharge: 2019-04-19 | Payer: PRIVATE HEALTH INSURANCE

## 2019-04-18 DIAGNOSIS — D899 Disorder involving the immune mechanism, unspecified: Principal | ICD-10-CM

## 2019-04-18 DIAGNOSIS — Z940 Kidney transplant status: Principal | ICD-10-CM

## 2019-04-18 LAB — BASIC METABOLIC PANEL
ANION GAP: 4 mmol/L — ABNORMAL LOW (ref 7–15)
BLOOD UREA NITROGEN: 22 mg/dL — ABNORMAL HIGH (ref 7–21)
BUN / CREAT RATIO: 31
CALCIUM: 9.9 mg/dL (ref 8.5–10.2)
CHLORIDE: 106 mmol/L (ref 98–107)
CREATININE: 0.72 mg/dL (ref 0.60–1.00)
EGFR CKD-EPI AA FEMALE: >90 mL/min/{1.73_m2} (ref >=60)
EGFR CKD-EPI NON-AA FEMALE: >90 mL/min/{1.73_m2} (ref >=60)
GLUCOSE RANDOM: 85 mg/dL (ref 70–99)
POTASSIUM: 4.9 mmol/L (ref 3.5–5.0)
SODIUM: 134 mmol/L — ABNORMAL LOW (ref 135–145)

## 2019-04-18 LAB — URINALYSIS
BACTERIA: NONE SEEN /HPF
BILIRUBIN UA: NEGATIVE
BLOOD UA: NEGATIVE
GLUCOSE UA: NEGATIVE
KETONES UA: NEGATIVE
LEUKOCYTE ESTERASE UA: NEGATIVE
PH UA: 6 (ref 5.0–9.0)
PROTEIN UA: NEGATIVE
RBC UA: <1 /HPF (ref <=4)
SPECIFIC GRAVITY UA: 1.016 (ref 1.003–1.030)
SQUAMOUS EPITHELIAL: 4 /HPF (ref 0–5)
UROBILINOGEN UA: 0.2
WBC UA: 2 /HPF (ref 0–5)

## 2019-04-18 LAB — TACROLIMUS, TROUGH: Lab: 6.1

## 2019-04-18 LAB — MEAN CORPUSCULAR VOLUME: Erythrocyte mean corpuscular volume:EntVol:Pt:RBC:Qn:Automated count: 102.5 — ABNORMAL HIGH

## 2019-04-18 LAB — CBC W/ AUTO DIFF
BASOPHILS ABSOLUTE COUNT: 0 10*9/L (ref 0.0–0.1)
BASOPHILS RELATIVE PERCENT: 0.4 %
EOSINOPHILS ABSOLUTE COUNT: 0.1 10*9/L (ref 0.0–0.4)
EOSINOPHILS RELATIVE PERCENT: 1.3 %
HEMATOCRIT: 33.4 % — ABNORMAL LOW (ref 36.0–46.0)
LARGE UNSTAINED CELLS: 1 % (ref 0–4)
LYMPHOCYTES ABSOLUTE COUNT: 0.1 10*9/L — ABNORMAL LOW (ref 1.5–5.0)
MEAN CORPUSCULAR HEMOGLOBIN CONC: 31.4 g/dL (ref 31.0–37.0)
MEAN CORPUSCULAR HEMOGLOBIN: 32.1 pg (ref 26.0–34.0)
MEAN CORPUSCULAR VOLUME: 102.5 fL — ABNORMAL HIGH (ref 80.0–100.0)
MEAN PLATELET VOLUME: 7.7 fL (ref 7.0–10.0)
MONOCYTES ABSOLUTE COUNT: 0.3 10*9/L (ref 0.2–0.8)
MONOCYTES RELATIVE PERCENT: 7.2 %
NEUTROPHILS ABSOLUTE COUNT: 4.2 10*9/L (ref 2.0–7.5)
NEUTROPHILS RELATIVE PERCENT: 87.6 %
PLATELET COUNT: 219 10*9/L (ref 150–440)
RED BLOOD CELL COUNT: 3.26 10*12/L — ABNORMAL LOW (ref 4.00–5.20)
WBC ADJUSTED: 4.7 10*9/L (ref 4.5–11.0)

## 2019-04-18 LAB — SMEAR REVIEW

## 2019-04-18 LAB — MAGNESIUM: Magnesium:MCnc:Pt:Ser/Plas:Qn:: 1.6

## 2019-04-18 LAB — PROTEIN URINE: Protein:MCnc:Pt:Urine:Qn:: 4.8

## 2019-04-18 LAB — PROTEIN / CREATININE RATIO, URINE: PROTEIN URINE: 4.8 mg/dL

## 2019-04-18 LAB — GLUCOSE UA: Glucose:MCnc:Pt:Urine:Qn:Test strip: NEGATIVE

## 2019-04-18 LAB — PHOSPHORUS
PHOSPHORUS: 4.8 mg/dL — ABNORMAL HIGH (ref 2.9–4.7)
Phosphate:MCnc:Pt:Ser/Plas:Qn:: 4.8 — ABNORMAL HIGH

## 2019-04-18 LAB — EGFR CKD-EPI NON-AA FEMALE
Glomerular filtration rate/1.73 sq M.predicted.non black:ArVRat:Pt:Ser/Plas/Bld:Qn:Creatinine-based formula (CKD-EPI): >90

## 2019-04-18 MED ORDER — ERGOCALCIFEROL (VITAMIN D2) 1,250 MCG (50,000 UNIT) CAPSULE
ORAL_CAPSULE | ORAL | 2 refills | 28.00000 days | Status: CP
Start: 2019-04-18 — End: 2020-04-17
  Filled 2019-04-20: qty 4, 28d supply, fill #0

## 2019-04-18 NOTE — Unmapped (Signed)
Urine colleted and sent to the lab.

## 2019-04-18 NOTE — Unmapped (Signed)
Transplant Coordinator, Clinic Visit   Pt seen today by transplant nephrology for follow up, reviewed medications and symptoms. Pt doing well, accompanied by her mother. Pt using walker and working with PT 1 x per week, will increased 2 x per week now that staples are out.          04/18/19 0833   BP: 108/76   Pulse: 96   Temp: 36.6 ??C (97.9 ??F)   Weight: 61.4 kg (135 lb 6.4 oz)   Height: 157.5 cm (5' 2)   PainSc: 0-No pain       Assessment  Headache: no  Hand tremors: no  Numbness/tingling: no  Fevers: no  Chills/sweats: no  Shortness of breath: no  Chest pain or pressure: no  Palpitations: no  Nausea/vomiting: no  Diarrhea/constipation: no  UTI symptoms: no  Swelling: no  Pain: denies  Incision: C/D/I, staples removed, steri-stripes applied    Good appetite; reports adequate hydration.     Any new medications? no  Immunosuppressant last taken: 10pm last night     I spent a total of 30 minutes with Carolyn Kelley reviewing medications and symptoms.

## 2019-04-19 LAB — CMV DNA, QUANTITATIVE, PCR: CMV VIRAL LD: NOT DETECTED

## 2019-04-19 LAB — CMV VIRAL LD: Lab: NOT DETECTED

## 2019-04-19 MED ORDER — FAMOTIDINE 20 MG TABLET
ORAL_TABLET | Freq: Every day | ORAL | 11 refills | 30 days | Status: CP | PRN
Start: 2019-04-19 — End: 2020-04-18
  Filled 2019-05-02: qty 30, 30d supply, fill #0

## 2019-04-19 NOTE — Unmapped (Signed)
[facility] CLINIC PHARMACY NOTE  04/18/2019   Carolyn Kelley  [id number]    Medication changes today:   1. CHANGE famotidine to PRN  2. START ergocalciferol 50,000 units weekly x 12 weeks    Education/Adherence tools provided today:  1.provided updated medication list  2. provided additional pill box education  3.  provided additional education on immunosuppression and transplant related medications including reviewing indications of medications, dosing and side effects    Follow up items:  1. goal of understanding indications and dosing of immunosuppression medications  2. Calcium off of Sensipar  3. Magnesium  4. VitD in 3 months (April 2021)  5. Contraception     Next visit with pharmacy in 1 month  ____________________________________________________________________    Carolyn Kelley is a 29 y.o. female s/p deceased kidney transplant on Mar 29, 2019 (Kidney) 2/2 C3 nephritis.     Other PMH significant for seizure disorder, PEA arrest s/p TTM in 2013 w/residual neurologic deficits    Seen by pharmacy today for: medication management and pill box fill and adherence education; last seen by pharmacy 1 months ago     CC:  Patient has no complaints today    Vitals:    04/18/19 0837   BP: 108/76   Pulse: 96   Temp: 36.6 ??C (97.9 ??F)       Allergies   Allergen Reactions   ??? Bee Pollen Swelling     Sneezing, congestion  denies swelling.  Sneezing, congestion  denies swelling.     ??? Pollen Extracts Swelling       All medications reviewed and updated.     Medication list includes revisions made during today???s encounter    Outpatient Encounter Medications as of 04/18/2019   Medication Sig Dispense Refill   ??? acetaminophen (TYLENOL) 500 MG tablet Take 1-2 tablets (500-1,000 mg total) by mouth every six (6) hours as needed for pain or fever (> 38C). 100 tablet 0   ??? aspirin (ECOTRIN) 81 MG tablet Take 1 tablet (81 mg total) by mouth daily. 30 tablet 11   ??? clonazePAM (KLONOPIN) 0.5 MG tablet Take 1 tablet (0.5 mg total) by mouth Two (2) times a day. 60 tablet 5   ??? docusate sodium (COLACE) 100 MG capsule Take 1 capsule (100 mg total) by mouth two (2) times a day as needed for constipation. 60 capsule 0   ??? ergocalciferol (DRISDOL) 1,250 mcg (50,000 unit) capsule Take 1 capsule (50,000 Units total) by mouth once a week. 4 capsule 2   ??? famotidine (PEPCID) 20 MG tablet Take 1 tablet (20 mg total) by mouth daily. 30 tablet 11   ??? levETIRAcetam (KEPPRA) 250 MG tablet Take 1 tablet (250 mg total) by mouth Two (2) times a day. 180 tablet 3   ??? magnesium oxide-Mg AA chelate (MAGNESIUM, AMINO ACID CHELATE,) 133 mg Tab Take 2 tablets by mouth Two (2) times a day. 120 tablet 11   ??? mycophenolate (MYFORTIC) 180 MG EC tablet Take 3 tablets (540 mg total) by mouth Two (2) times a day. Adjust dose per medication card. 180 tablet 11   ??? polyethylene glycol (MIRALAX) 17 gram packet Mix 1 packet (17 g) in 8oz of water, tea, coffee, or juice and drink by mouth daily as needed (constipation). 30 each 0   ??? sulfamethoxazole-trimethoprim (BACTRIM) 400-80 mg per tablet Take 1 tablet (80 mg of trimethoprim total) by mouth Every Monday, Wednesday, and Friday. 12 tablet 5   ??? tacrolimus (ENVARSUS XR) 1 mg  Tb24 extended release tablet Take 3 tablets (3 mg total) by mouth daily with 1 (4 mg) tablet for total dose of 7 mg daily 90 tablet 11   ??? tacrolimus (ENVARSUS XR) 4 mg Tb24 extended release tablet Take 1 tablet (4 mg total) by mouth daily. 30 tablet 11   ??? traMADoL (ULTRAM) 50 mg tablet Take 1-2 tablets (50-100 mg total) by mouth every twelve (12) hours as needed for pain. 30 tablet 0   ??? valGANciclovir (VALCYTE) 450 mg tablet Take 1 tablet (450 mg total) by mouth daily. 30 tablet 2   ??? [EXPIRED] lidocaine 2% gel (XYLOCAINE) jelly urojet 20 mL      ??? [EXPIRED] sodium chloride irrigation (NS) 0.9 % irrigation solution 500 mL        No facility-administered encounter medications on file as of 04/18/2019.        Induction agent : alemtuzumab CURRENT IMMUNOSUPPRESSION: Envarsus 7 mg PO qd  prograf/Envarsus/cyclosporine goal: 8-10   myfortic540  mg PO bid    steroid free     Patient is tolerating immunosuppression well. Occasional HA resolved with Tylenol, already had tremors pre transplant    IMMUNOSUPPRESSION DRUG LEVELS:  Lab Results   Component Value Date    Tacrolimus, Trough 6.1 04/18/2019    Tacrolimus, Trough 6.7 03/23/2019    Tacrolimus, Trough 8.8 03/20/2019    Tacrolimus Lvl 9.2 04/14/2019    Tacrolimus Lvl 10.0 04/12/2019    Tacrolimus Lvl 6.8 04/10/2019     No results found for: CYCLO  No results found for: EVEROLIMUS  No results found for: SIROLIMUS    Envarsus level is accurate 24 hour trough    Graft function: stable  DSA: ntd  Zero hour biopsy: without diagnostic abnormalities  Biopsies to date: ntd  UPC: 0.055 04/18/19  WBC/ANC:  wnl    Plan:Envarsus level low. Will maintain current immunosuppression. Will continue to trend and adjust if next dose is also low and patient endorses taking on an empty stomach. Continue to monitor closely.    OI Prophylaxis:   CMV Status: D+/ R+, moderate risk . CMV prophylaxis: valganciclovir 450 mg daily x 3 months per protocol. End date: June 08 2019  No results found for: CMVCP  PCP Prophylaxis: bactrim SS 1 tab MWF x 6 months. End date: September 08 2019  Thrush: completed in hospital  Patient is  tolerating infectious prophylaxis well    Plan: Continue per protocol. Continue to monitor.    CV Prophylaxis: asa 81 mg   The ASCVD Risk score Denman George DC Montez Hageman, et al., 2013) failed to calculate.  Statin therapy: Not indicated  Plan:  Continue to monitor     BP: Goal < 140/90. Clinic vitals reported above  Home BP ranges: 90-100/60-70  Current meds include: none  Plan: within goal. Continue to monitor    Anemia of CKD:  H/H:   Lab Results   Component Value Date    HGB 10.5 (L) 04/18/2019     Lab Results   Component Value Date    HCT 33.4 (L) 04/18/2019     Iron panel:  Lab Results   Component Value Date    IRON 55 12/23/2015    TIBC 213.1 (L) 12/23/2015    FERRITIN 588.0 (H) 12/23/2015     Lab Results   Component Value Date    Iron Saturation (%) 26 12/23/2015    Iron Saturation (%) 26 04/29/2011     Prior ESA use: none post transplant  Plan:  stable. Continue to monitor.     DM:   Lab Results   Component Value Date    A1C 4.6 (L) 03/09/2019   . Goal A1c < 7  FBG: mostly in 80s  History of Dm? No*  Plan:  Continue to monitor    Fluid Intake: 60-80 oz daily  Plan: Continue adequate fluid intake    Electrolytes: Mg 1.6;, calcium 9.9  Meds currently on: Mg plus protein 266 mg BID  Plan: Continue to monitor     GI/BM: pt reports 1-2BM daily  Meds currently on: docusate PRN (not using), Miralax PRN (not using), famotidine 20 mg daily  Plan: Change famotidine 20 mg daily to PRN. Continue to monitor     Pain: pt reports no pain  Meds currently on: APAP PRN (using for occasional headaches)  Plan: Continue to monitor    Bone health:   Vitamin D Level: last level is 10.9 on 03/23/19. Goal > 30.   Last DEXA results:  none available  Current meds include: none  Plan: Vitamin D level  out of goal. START ergocalciferol 50,000 units weekly x 12 weeks. Continue to monitor.     Women's/Men's Health:  Bahja Bence is a 29 y.o. Female of childbearing age. Patient reports no men's/women's health issues   Plan: Continue to monitor. At next visit, ensure the patient has a plan for contraception or will follow up with GYN    Seizure history - last seizure while hospitalized in 2014  Meds currently on: Keppra 250 mg BID  Plan: continue to monitor    Pharmacy preference:  [facility]    Adherence: Patient has average understanding of medications; was able to independently identify names/doses of immunosuppressants and OI meds.  Patient  does fill their own pill box on a regular basis at home.  Patient brought medication card:yes  Pill box:was correct  Plan: provided extensive adherence counseling/intervention    Spent approximately 30 minutes on educating this patient and greater than 50% was spent in direct face to face counseling regarding post transplant medication education. Questions and concerns were address to patient's satisfaction.    Patient was reviewed with Darolyn Rua, AGNP who was agreement with the stated plan:     During this visit, the following was completed:   BP log data assessment  Labs ordered and evaluated  complex treatment plan >1 DS   Patient education was completed for 11-24 minutes     All questions/concerns were addressed to the patient's satisfaction.  __________________________________________  Phillis Haggis, PharmD Candidate    Cleone Slim, PHARMD, CPP  [employer] TRANSPLANT CLINICAL PHARMACIST PRACTITIONER  PAGER [phone]

## 2019-04-20 DIAGNOSIS — D899 Disorder involving the immune mechanism, unspecified: Principal | ICD-10-CM

## 2019-04-20 DIAGNOSIS — Z940 Kidney transplant status: Principal | ICD-10-CM

## 2019-04-20 MED FILL — ERGOCALCIFEROL (VITAMIN D2) 1,250 MCG (50,000 UNIT) CAPSULE: 28 days supply | Qty: 4 | Fill #0 | Status: AC

## 2019-04-21 LAB — BK VIRUS QUANTITATIVE PCR, BLOOD

## 2019-04-21 LAB — BK BLOOD QUANT: Lab: 0

## 2019-04-22 LAB — RENAL FUNCTION PANEL
ALBUMIN: 4.2 g/dL (ref 3.9–5.0)
BLOOD UREA NITROGEN: 15 mg/dL (ref 6–20)
BUN / CREAT RATIO: 20 (ref 9–23)
CALCIUM: 10 mg/dL (ref 8.7–10.2)
CHLORIDE: 107 mmol/L — ABNORMAL HIGH (ref 96–106)
CO2: 19 mmol/L — ABNORMAL LOW (ref 20–29)
GFR MDRD AF AMER: 127 mL/min/{1.73_m2}
GFR MDRD NON AF AMER: 110 mL/min/{1.73_m2}
PHOSPHORUS, SERUM: 4.1 mg/dL (ref 3.0–4.3)
SODIUM: 138 mmol/L (ref 134–144)

## 2019-04-22 LAB — CBC W/ DIFFERENTIAL
BASOPHILS ABSOLUTE COUNT: 0 10*3/uL (ref 0.0–0.2)
BASOPHILS RELATIVE PERCENT: 1 %
EOSINOPHILS ABSOLUTE COUNT: 0 10*3/uL (ref 0.0–0.4)
EOSINOPHILS RELATIVE PERCENT: 1 %
HEMATOCRIT: 31.3 % — ABNORMAL LOW (ref 34.0–46.6)
HEMOGLOBIN: 10.4 g/dL — ABNORMAL LOW (ref 11.1–15.9)
IMMATURE GRANULOCYTES: 0 %
LYMPHOCYTES ABSOLUTE COUNT: 0.1 10*3/uL — ABNORMAL LOW (ref 0.7–3.1)
LYMPHOCYTES RELATIVE PERCENT: 4 %
MEAN CORPUSCULAR HEMOGLOBIN CONC: 33.2 g/dL (ref 31.5–35.7)
MEAN CORPUSCULAR HEMOGLOBIN: 32.5 pg (ref 26.6–33.0)
MEAN CORPUSCULAR VOLUME: 98 fL — ABNORMAL HIGH (ref 79–97)
MONOCYTES ABSOLUTE COUNT: 0.4 10*3/uL (ref 0.1–0.9)
MONOCYTES RELATIVE PERCENT: 12 %
NEUTROPHILS ABSOLUTE COUNT: 2.9 10*3/uL (ref 1.4–7.0)
NEUTROPHILS RELATIVE PERCENT: 82 %
PLATELET COUNT: 214 10*3/uL (ref 150–450)
RED CELL DISTRIBUTION WIDTH: 13.1 % (ref 11.7–15.4)
WHITE BLOOD CELL COUNT: 3.5 10*3/uL (ref 3.4–10.8)

## 2019-04-22 LAB — MEAN CORPUSCULAR HEMOGLOBIN: Erythrocyte mean corpuscular hemoglobin:EntMass:Pt:RBC:Qn:Automated count: 32.5

## 2019-04-22 LAB — CHLORIDE: Chloride:SCnc:Pt:Ser/Plas:Qn:: 107 — ABNORMAL HIGH

## 2019-04-22 LAB — MAGNESIUM: Magnesium:MCnc:Pt:Ser/Plas:Qn:: 1.8

## 2019-04-23 LAB — HLA DS POST TRANSPLANT
ANTI-DONOR DRW #1 MFI: 71 MFI
ANTI-DONOR HLA-A #1 MFI: 9 MFI
ANTI-DONOR HLA-A #2 MFI: 9 MFI
ANTI-DONOR HLA-B #1 MFI: 16 MFI
ANTI-DONOR HLA-B #2 MFI: 22 MFI
ANTI-DONOR HLA-C #1 MFI: 0 MFI
ANTI-DONOR HLA-C #2 MFI: 12 MFI
ANTI-DONOR HLA-DQB #1 MFI: 445 MFI
ANTI-DONOR HLA-DQB #2 MFI: 445 MFI
ANTI-DONOR HLA-DR #1 MFI: 77 MFI
ANTI-DONOR HLA-DR #2 MFI: 95 MFI

## 2019-04-23 LAB — FSAB CLASS 1 ANTIBODY SPECIFICITY: HLA CLASS 1 ANTIBODY RESULT: NEGATIVE

## 2019-04-23 LAB — DONOR HLA-A ANTIGEN #2

## 2019-04-23 LAB — HLA CL1 ANTIBODY COMM: Lab: 0

## 2019-04-23 LAB — FSAB CLASS 2 ANTIBODY SPECIFICITY: HLA CL2 AB RESULT: POSITIVE

## 2019-04-23 LAB — HLA CL2 AB COMMENT: Lab: 0

## 2019-04-23 LAB — TACROLIMUS BLOOD: Tacrolimus:MCnc:Pt:Bld:Qn:LC/MS/MS: 5.7

## 2019-04-24 DIAGNOSIS — Z940 Kidney transplant status: Principal | ICD-10-CM

## 2019-04-24 DIAGNOSIS — D899 Disorder involving the immune mechanism, unspecified: Principal | ICD-10-CM

## 2019-04-25 DIAGNOSIS — D899 Disorder involving the immune mechanism, unspecified: Principal | ICD-10-CM

## 2019-04-25 DIAGNOSIS — Z940 Kidney transplant status: Principal | ICD-10-CM

## 2019-04-25 LAB — CBC W/ DIFFERENTIAL
BANDED NEUTROPHILS ABSOLUTE COUNT: 0 10*3/uL (ref 0.0–0.1)
BASOPHILS ABSOLUTE COUNT: 0 10*3/uL (ref 0.0–0.2)
BASOPHILS RELATIVE PERCENT: 1 %
EOSINOPHILS ABSOLUTE COUNT: 0.1 10*3/uL (ref 0.0–0.4)
EOSINOPHILS RELATIVE PERCENT: 1 %
HEMATOCRIT: 32.6 % — ABNORMAL LOW (ref 34.0–46.6)
HEMOGLOBIN: 10.7 g/dL — ABNORMAL LOW (ref 11.1–15.9)
IMMATURE GRANULOCYTES: 0 %
LYMPHOCYTES ABSOLUTE COUNT: 0.1 10*3/uL — ABNORMAL LOW (ref 0.7–3.1)
LYMPHOCYTES RELATIVE PERCENT: 3 %
MEAN CORPUSCULAR HEMOGLOBIN CONC: 32.8 g/dL (ref 31.5–35.7)
MEAN CORPUSCULAR HEMOGLOBIN: 32.7 pg (ref 26.6–33.0)
MEAN CORPUSCULAR VOLUME: 100 fL — ABNORMAL HIGH (ref 79–97)
MONOCYTES ABSOLUTE COUNT: 0.6 10*3/uL (ref 0.1–0.9)
MONOCYTES RELATIVE PERCENT: 12 %
NEUTROPHILS ABSOLUTE COUNT: 4 10*3/uL (ref 1.4–7.0)
NEUTROPHILS RELATIVE PERCENT: 83 %
RED BLOOD CELL COUNT: 3.27 x10E6/uL — ABNORMAL LOW (ref 3.77–5.28)
RED CELL DISTRIBUTION WIDTH: 13 % (ref 11.7–15.4)
WHITE BLOOD CELL COUNT: 4.8 10*3/uL (ref 3.4–10.8)

## 2019-04-25 LAB — RENAL FUNCTION PANEL
ALBUMIN: 4.1 g/dL (ref 3.9–5.0)
BLOOD UREA NITROGEN: 24 mg/dL — ABNORMAL HIGH (ref 6–20)
BUN / CREAT RATIO: 23 (ref 9–23)
CHLORIDE: 107 mmol/L — ABNORMAL HIGH (ref 96–106)
CREATININE: 1.03 mg/dL — ABNORMAL HIGH (ref 0.57–1.00)
GFR MDRD AF AMER: 85 mL/min/{1.73_m2}
GFR MDRD NON AF AMER: 74 mL/min/{1.73_m2}
GLUCOSE: 86 mg/dL (ref 65–99)
PHOSPHORUS, SERUM: 4.2 mg/dL (ref 3.0–4.3)
POTASSIUM: 5 mmol/L (ref 3.5–5.2)
SODIUM: 136 mmol/L (ref 134–144)

## 2019-04-25 LAB — MAGNESIUM: Magnesium:MCnc:Pt:Ser/Plas:Qn:: 1.7

## 2019-04-25 LAB — BUN / CREAT RATIO: Urea nitrogen/Creatinine:MRto:Pt:Ser/Plas:Qn:: 23

## 2019-04-25 LAB — LYMPHOCYTES RELATIVE PERCENT: Lymphocytes/100 leukocytes:NFr:Pt:Bld:Qn:Automated count: 3

## 2019-04-26 LAB — TACROLIMUS BLOOD: Tacrolimus:MCnc:Pt:Bld:Qn:LC/MS/MS: 11.2

## 2019-04-26 NOTE — Unmapped (Signed)
Called patient after she left a VM    Patient asked if she could take a bath to get her hair washed.    Let her know that was fine but to not fully submerge her body in the water.    Denies any other needs

## 2019-04-27 DIAGNOSIS — D899 Disorder involving the immune mechanism, unspecified: Principal | ICD-10-CM

## 2019-04-27 DIAGNOSIS — Z940 Kidney transplant status: Principal | ICD-10-CM

## 2019-04-27 LAB — RENAL FUNCTION PANEL
ALBUMIN: 4.1 g/dL (ref 3.9–5.0)
BLOOD UREA NITROGEN: 14 mg/dL (ref 6–20)
CALCIUM: 9.7 mg/dL (ref 8.7–10.2)
CHLORIDE: 109 mmol/L — ABNORMAL HIGH (ref 96–106)
CO2: 17 mmol/L — ABNORMAL LOW (ref 20–29)
CREATININE: 0.74 mg/dL (ref 0.57–1.00)
GFR MDRD AF AMER: 127 mL/min/{1.73_m2}
GFR MDRD NON AF AMER: 110 mL/min/{1.73_m2}
POTASSIUM: 4.9 mmol/L (ref 3.5–5.2)
SODIUM: 138 mmol/L (ref 134–144)

## 2019-04-27 LAB — CBC W/ DIFFERENTIAL
BANDED NEUTROPHILS ABSOLUTE COUNT: 0 10*3/uL (ref 0.0–0.1)
BASOPHILS ABSOLUTE COUNT: 0 10*3/uL (ref 0.0–0.2)
BASOPHILS RELATIVE PERCENT: 1 %
EOSINOPHILS ABSOLUTE COUNT: 0.1 10*3/uL (ref 0.0–0.4)
EOSINOPHILS RELATIVE PERCENT: 2 %
HEMOGLOBIN: 10.4 g/dL — ABNORMAL LOW (ref 11.1–15.9)
IMMATURE GRANULOCYTES: 1 %
LYMPHOCYTES ABSOLUTE COUNT: 0.1 10*3/uL — ABNORMAL LOW (ref 0.7–3.1)
LYMPHOCYTES RELATIVE PERCENT: 4 %
MEAN CORPUSCULAR HEMOGLOBIN CONC: 33.3 g/dL (ref 31.5–35.7)
MEAN CORPUSCULAR HEMOGLOBIN: 33 pg (ref 26.6–33.0)
MEAN CORPUSCULAR VOLUME: 99 fL — ABNORMAL HIGH (ref 79–97)
MONOCYTES ABSOLUTE COUNT: 0.4 10*3/uL (ref 0.1–0.9)
MONOCYTES RELATIVE PERCENT: 10 %
NEUTROPHILS ABSOLUTE COUNT: 3.1 10*3/uL (ref 1.4–7.0)
NEUTROPHILS RELATIVE PERCENT: 82 %
RED BLOOD CELL COUNT: 3.15 x10E6/uL — ABNORMAL LOW (ref 3.77–5.28)
WHITE BLOOD CELL COUNT: 3.7 10*3/uL (ref 3.4–10.8)

## 2019-04-27 LAB — NEUTROPHILS RELATIVE PERCENT: Neutrophils/100 leukocytes:NFr:Pt:Bld:Qn:Automated count: 82

## 2019-04-27 LAB — POTASSIUM: Potassium:SCnc:Pt:Ser/Plas:Qn:: 4.9

## 2019-04-27 LAB — MAGNESIUM: Magnesium:MCnc:Pt:Ser/Plas:Qn:: 1.6

## 2019-04-27 NOTE — Unmapped (Signed)
[facility] Refill Coordination Note    Specialty Medication(s) to be Shipped:   Transplant: mycophenolate mofetil 180mg  and valgancyclovir 450mg     Other medication(s) to be shipped: Famotidine,magnesium 133mg ,Sulfamethoxazole-Trimethoprim     Carolyn Kelley, DOB: [birth year]  Phone: [phone] (home)       All above HIPAA information was verified with patient.     Was a Nurse, learning disability used for this call? No    Completed refill call assessment today to schedule patient's medication shipment from the [facility] [phone]).       Specialty medication(s) and dose(s) confirmed: Regimen is correct and unchanged.   Changes to medications: Hether reports no changes at this time.  Changes to insurance: No  Questions for the pharmacist: No    Confirmed patient received Welcome Packet with first shipment. The patient will receive a drug information handout for each medication shipped and additional FDA Medication Guides as required.       DISEASE/MEDICATION-SPECIFIC INFORMATION        N/A    SPECIALTY MEDICATION ADHERENCE     Medication Adherence    Patient reported X missed doses in the last month: 0  Specialty Medication: Mycophenolate 180mg   Patient is on additional specialty medications: Yes  Additional Specialty Medications: Valgancyclovir 450mg   Patient Reported Additional Medication X Missed Doses in the Last Month: 0  Patient is on more than two specialty medications: No  Informant: patient                Mycophenolate 180 mg: 10 days of medicine on hand   Valgancyclovir 450 mg: 10 days of medicine on hand         SHIPPING     Shipping address confirmed in Epic.     Delivery Scheduled: Yes, Expected medication delivery date: 02/10.     Medication will be delivered via UPS to the prescription address in Epic WAM.    Antonietta Barcelona   [facility] Specialty Technician

## 2019-04-27 NOTE — Unmapped (Signed)
Reviewed last tac level with heather perry np.  Per heather will wait for yesterday's level before making any changes

## 2019-04-28 LAB — TACROLIMUS BLOOD: Tacrolimus:MCnc:Pt:Bld:Qn:LC/MS/MS: 7.8

## 2019-04-29 LAB — RENAL FUNCTION PANEL
ALBUMIN: 4.3 g/dL (ref 3.9–5.0)
BLOOD UREA NITROGEN: 19 mg/dL (ref 6–20)
BUN / CREAT RATIO: 28 — ABNORMAL HIGH (ref 9–23)
CALCIUM: 10.2 mg/dL (ref 8.7–10.2)
CHLORIDE: 107 mmol/L — ABNORMAL HIGH (ref 96–106)
CO2: 17 mmol/L — ABNORMAL LOW (ref 20–29)
CREATININE: 0.67 mg/dL (ref 0.57–1.00)
GFR MDRD AF AMER: 137 mL/min/{1.73_m2}
GLUCOSE: 85 mg/dL (ref 65–99)
PHOSPHORUS, SERUM: 4 mg/dL (ref 3.0–4.3)
POTASSIUM: 5 mmol/L (ref 3.5–5.2)
SODIUM: 136 mmol/L (ref 134–144)

## 2019-04-29 LAB — CBC W/ DIFFERENTIAL
BANDED NEUTROPHILS ABSOLUTE COUNT: 0 10*3/uL (ref 0.0–0.1)
BASOPHILS ABSOLUTE COUNT: 0 10*3/uL (ref 0.0–0.2)
BASOPHILS RELATIVE PERCENT: 1 %
EOSINOPHILS ABSOLUTE COUNT: 0.1 10*3/uL (ref 0.0–0.4)
EOSINOPHILS RELATIVE PERCENT: 2 %
HEMATOCRIT: 31.1 % — ABNORMAL LOW (ref 34.0–46.6)
HEMOGLOBIN: 10.7 g/dL — ABNORMAL LOW (ref 11.1–15.9)
IMMATURE GRANULOCYTES: 1 %
LYMPHOCYTES ABSOLUTE COUNT: 0.1 10*3/uL — ABNORMAL LOW (ref 0.7–3.1)
LYMPHOCYTES RELATIVE PERCENT: 3 %
MEAN CORPUSCULAR HEMOGLOBIN CONC: 34.4 g/dL (ref 31.5–35.7)
MEAN CORPUSCULAR HEMOGLOBIN: 33.5 pg — ABNORMAL HIGH (ref 26.6–33.0)
MONOCYTES ABSOLUTE COUNT: 0.3 10*3/uL (ref 0.1–0.9)
MONOCYTES RELATIVE PERCENT: 9 %
NEUTROPHILS ABSOLUTE COUNT: 3.2 10*3/uL (ref 1.4–7.0)
NEUTROPHILS RELATIVE PERCENT: 84 %
PLATELET COUNT: 280 10*3/uL (ref 150–450)
RED BLOOD CELL COUNT: 3.19 x10E6/uL — ABNORMAL LOW (ref 3.77–5.28)
RED CELL DISTRIBUTION WIDTH: 13 % (ref 11.7–15.4)
WHITE BLOOD CELL COUNT: 3.7 10*3/uL (ref 3.4–10.8)

## 2019-04-29 LAB — MAGNESIUM: Magnesium:MCnc:Pt:Ser/Plas:Qn:: 1.6

## 2019-04-29 LAB — BLOOD UREA NITROGEN: Urea nitrogen:MCnc:Pt:Ser/Plas:Qn:: 19

## 2019-04-29 LAB — LYMPHOCYTES ABSOLUTE COUNT: Lymphocytes:NCnc:Pt:Bld:Qn:Automated count: 0.1 — ABNORMAL LOW

## 2019-05-01 DIAGNOSIS — D899 Disorder involving the immune mechanism, unspecified: Principal | ICD-10-CM

## 2019-05-01 DIAGNOSIS — Z940 Kidney transplant status: Principal | ICD-10-CM

## 2019-05-01 LAB — TACROLIMUS BLOOD: Tacrolimus:MCnc:Pt:Bld:Qn:LC/MS/MS: 5.7

## 2019-05-01 NOTE — Unmapped (Signed)
Patient reports she accidentally took an extra dose of all her medications Saturday. She called the on call and followed their recommendations.  Is back taking normal doses today. Repeats she is feeling fine.    Denies any other needs

## 2019-05-02 DIAGNOSIS — D899 Disorder involving the immune mechanism, unspecified: Principal | ICD-10-CM

## 2019-05-02 DIAGNOSIS — Z940 Kidney transplant status: Principal | ICD-10-CM

## 2019-05-02 LAB — RENAL FUNCTION PANEL
ALBUMIN: 4.1 g/dL (ref 3.9–5.0)
BLOOD UREA NITROGEN: 15 mg/dL (ref 6–20)
BUN / CREAT RATIO: 20 (ref 9–23)
CALCIUM: 9.7 mg/dL (ref 8.7–10.2)
CHLORIDE: 106 mmol/L (ref 96–106)
CO2: 18 mmol/L — ABNORMAL LOW (ref 20–29)
CREATININE: 0.76 mg/dL (ref 0.57–1.00)
GFR MDRD AF AMER: 123 mL/min/{1.73_m2}
GFR MDRD NON AF AMER: 106 mL/min/{1.73_m2}
PHOSPHORUS, SERUM: 3.9 mg/dL (ref 3.0–4.3)
SODIUM: 137 mmol/L (ref 134–144)

## 2019-05-02 LAB — CBC W/ DIFFERENTIAL
BANDED NEUTROPHILS ABSOLUTE COUNT: 0 10*3/uL (ref 0.0–0.1)
BASOPHILS ABSOLUTE COUNT: 0 10*3/uL (ref 0.0–0.2)
BASOPHILS RELATIVE PERCENT: 1 %
EOSINOPHILS ABSOLUTE COUNT: 0.1 10*3/uL (ref 0.0–0.4)
EOSINOPHILS RELATIVE PERCENT: 2 %
HEMATOCRIT: 29.8 % — ABNORMAL LOW (ref 34.0–46.6)
HEMOGLOBIN: 10 g/dL — ABNORMAL LOW (ref 11.1–15.9)
IMMATURE GRANULOCYTES: 1 %
LYMPHOCYTES ABSOLUTE COUNT: 0.2 10*3/uL — ABNORMAL LOW (ref 0.7–3.1)
MEAN CORPUSCULAR HEMOGLOBIN CONC: 33.6 g/dL (ref 31.5–35.7)
MEAN CORPUSCULAR HEMOGLOBIN: 32.7 pg (ref 26.6–33.0)
MEAN CORPUSCULAR VOLUME: 97 fL (ref 79–97)
MONOCYTES ABSOLUTE COUNT: 0.3 10*3/uL (ref 0.1–0.9)
NEUTROPHILS ABSOLUTE COUNT: 3.1 10*3/uL (ref 1.4–7.0)
NEUTROPHILS RELATIVE PERCENT: 85 %
RED BLOOD CELL COUNT: 3.06 x10E6/uL — ABNORMAL LOW (ref 3.77–5.28)
RED CELL DISTRIBUTION WIDTH: 12.6 % (ref 11.7–15.4)

## 2019-05-02 LAB — GFR MDRD NON AF AMER
Glomerular filtration rate/1.73 sq M.predicted.non black:ArVRat:Pt:Ser/Plas/Bld:Qn:Creatinine-based formula (CKD-EPI): 106

## 2019-05-02 LAB — HEMATOCRIT: Hematocrit:VFr:Pt:Bld:Qn:Automated count: 29.8 — ABNORMAL LOW

## 2019-05-02 LAB — MAGNESIUM: Magnesium:MCnc:Pt:Ser/Plas:Qn:: 1.8

## 2019-05-02 MED FILL — VALGANCICLOVIR 450 MG TABLET: ORAL | 30 days supply | Qty: 30 | Fill #1

## 2019-05-02 MED FILL — MYCOPHENOLATE SODIUM 180 MG TABLET,DELAYED RELEASE: ORAL | 30 days supply | Qty: 180 | Fill #1

## 2019-05-02 MED FILL — MYCOPHENOLATE SODIUM 180 MG TABLET,DELAYED RELEASE: 30 days supply | Qty: 180 | Fill #1 | Status: AC

## 2019-05-02 MED FILL — SULFAMETHOXAZOLE 400 MG-TRIMETHOPRIM 80 MG TABLET: 28 days supply | Qty: 12 | Fill #1 | Status: AC

## 2019-05-02 MED FILL — MG-PLUS-PROTEIN 133 MG TABLET: 30 days supply | Qty: 120 | Fill #1 | Status: AC

## 2019-05-02 MED FILL — FAMOTIDINE 20 MG TABLET: 30 days supply | Qty: 30 | Fill #0 | Status: AC

## 2019-05-02 MED FILL — VALGANCICLOVIR 450 MG TABLET: 30 days supply | Qty: 30 | Fill #1 | Status: AC

## 2019-05-02 MED FILL — SULFAMETHOXAZOLE 400 MG-TRIMETHOPRIM 80 MG TABLET: ORAL | 28 days supply | Qty: 12 | Fill #1

## 2019-05-02 MED FILL — MG-PLUS-PROTEIN 133 MG TABLET: ORAL | 30 days supply | Qty: 120 | Fill #1

## 2019-05-03 LAB — TACROLIMUS BLOOD: Tacrolimus:MCnc:Pt:Bld:Qn:LC/MS/MS: 7.4

## 2019-05-04 DIAGNOSIS — D899 Disorder involving the immune mechanism, unspecified: Principal | ICD-10-CM

## 2019-05-04 DIAGNOSIS — Z940 Kidney transplant status: Principal | ICD-10-CM

## 2019-05-04 LAB — CBC W/ DIFFERENTIAL
BANDED NEUTROPHILS ABSOLUTE COUNT: 0.1 10*3/uL (ref 0.0–0.1)
BASOPHILS ABSOLUTE COUNT: 0 10*3/uL (ref 0.0–0.2)
BASOPHILS RELATIVE PERCENT: 1 %
EOSINOPHILS ABSOLUTE COUNT: 0.1 10*3/uL (ref 0.0–0.4)
EOSINOPHILS RELATIVE PERCENT: 2 %
IMMATURE GRANULOCYTES: 2 %
LYMPHOCYTES ABSOLUTE COUNT: 0.2 10*3/uL — ABNORMAL LOW (ref 0.7–3.1)
LYMPHOCYTES RELATIVE PERCENT: 4 %
MEAN CORPUSCULAR HEMOGLOBIN: 33.6 pg — ABNORMAL HIGH (ref 26.6–33.0)
MEAN CORPUSCULAR VOLUME: 99 fL — ABNORMAL HIGH (ref 79–97)
MONOCYTES ABSOLUTE COUNT: 0.3 10*3/uL (ref 0.1–0.9)
NEUTROPHILS ABSOLUTE COUNT: 2.9 10*3/uL (ref 1.4–7.0)
NEUTROPHILS RELATIVE PERCENT: 84 %
PLATELET COUNT: 296 10*3/uL (ref 150–450)
RED BLOOD CELL COUNT: 3.04 x10E6/uL — ABNORMAL LOW (ref 3.77–5.28)
RED CELL DISTRIBUTION WIDTH: 12.6 % (ref 11.7–15.4)
WHITE BLOOD CELL COUNT: 3.5 10*3/uL (ref 3.4–10.8)

## 2019-05-04 LAB — RENAL FUNCTION PANEL
ALBUMIN: 4.2 g/dL (ref 3.9–5.0)
BLOOD UREA NITROGEN: 12 mg/dL (ref 6–20)
BUN / CREAT RATIO: 16 (ref 9–23)
CALCIUM: 9.8 mg/dL (ref 8.7–10.2)
CHLORIDE: 108 mmol/L — ABNORMAL HIGH (ref 96–106)
CO2: 19 mmol/L — ABNORMAL LOW (ref 20–29)
CREATININE: 0.74 mg/dL (ref 0.57–1.00)
GFR MDRD NON AF AMER: 110 mL/min/{1.73_m2}
GLUCOSE: 98 mg/dL (ref 65–99)
PHOSPHORUS, SERUM: 3.9 mg/dL (ref 3.0–4.3)
POTASSIUM: 5.1 mmol/L (ref 3.5–5.2)
SODIUM: 139 mmol/L (ref 134–144)

## 2019-05-04 LAB — CALCIUM: Calcium:MCnc:Pt:Ser/Plas:Qn:: 9.8

## 2019-05-04 LAB — NEUTROPHILS RELATIVE PERCENT: Neutrophils/100 leukocytes:NFr:Pt:Bld:Qn:Automated count: 84

## 2019-05-04 LAB — MAGNESIUM: Magnesium:MCnc:Pt:Ser/Plas:Qn:: 1.8

## 2019-05-05 DIAGNOSIS — Z79899 Other long term (current) drug therapy: Principal | ICD-10-CM

## 2019-05-05 DIAGNOSIS — Z940 Kidney transplant status: Principal | ICD-10-CM

## 2019-05-05 NOTE — Unmapped (Signed)
Patient called to let me know she is on her menstrual cycle.  Denies any other needs.

## 2019-05-06 LAB — CBC W/ DIFFERENTIAL
BANDED NEUTROPHILS ABSOLUTE COUNT: 0.1 10*3/uL (ref 0.0–0.1)
BASOPHILS ABSOLUTE COUNT: 0 10*3/uL (ref 0.0–0.2)
BASOPHILS RELATIVE PERCENT: 1 %
EOSINOPHILS ABSOLUTE COUNT: 0.1 10*3/uL (ref 0.0–0.4)
EOSINOPHILS RELATIVE PERCENT: 2 %
HEMATOCRIT: 31.1 % — ABNORMAL LOW (ref 34.0–46.6)
HEMOGLOBIN: 10.7 g/dL — ABNORMAL LOW (ref 11.1–15.9)
IMMATURE GRANULOCYTES: 3 %
LYMPHOCYTES ABSOLUTE COUNT: 0.2 10*3/uL — ABNORMAL LOW (ref 0.7–3.1)
LYMPHOCYTES RELATIVE PERCENT: 6 %
MEAN CORPUSCULAR HEMOGLOBIN CONC: 34.4 g/dL (ref 31.5–35.7)
MEAN CORPUSCULAR HEMOGLOBIN: 33 pg (ref 26.6–33.0)
MEAN CORPUSCULAR VOLUME: 96 fL (ref 79–97)
MONOCYTES RELATIVE PERCENT: 6 %
NEUTROPHILS ABSOLUTE COUNT: 2.5 10*3/uL (ref 1.4–7.0)
NEUTROPHILS RELATIVE PERCENT: 82 %
RED BLOOD CELL COUNT: 3.24 x10E6/uL — ABNORMAL LOW (ref 3.77–5.28)
RED CELL DISTRIBUTION WIDTH: 12.4 % (ref 11.7–15.4)
WHITE BLOOD CELL COUNT: 3.1 10*3/uL — ABNORMAL LOW (ref 3.4–10.8)

## 2019-05-06 LAB — MAGNESIUM: Magnesium:MCnc:Pt:Ser/Plas:Qn:: 1.8

## 2019-05-06 LAB — RENAL FUNCTION PANEL
ALBUMIN: 4.2 g/dL (ref 3.9–5.0)
BLOOD UREA NITROGEN: 17 mg/dL (ref 6–20)
BUN / CREAT RATIO: 20 (ref 9–23)
CALCIUM: 9.6 mg/dL (ref 8.7–10.2)
CHLORIDE: 108 mmol/L — ABNORMAL HIGH (ref 96–106)
CREATININE: 0.83 mg/dL (ref 0.57–1.00)
GFR MDRD AF AMER: 110 mL/min/{1.73_m2}
GFR MDRD NON AF AMER: 96 mL/min/{1.73_m2}
GLUCOSE: 92 mg/dL (ref 65–99)
POTASSIUM: 5.1 mmol/L (ref 3.5–5.2)
SODIUM: 138 mmol/L (ref 134–144)

## 2019-05-06 LAB — GLUCOSE: Glucose:MCnc:Pt:Ser/Plas:Qn:: 92

## 2019-05-06 LAB — TACROLIMUS BLOOD: Tacrolimus:MCnc:Pt:Bld:Qn:LC/MS/MS: 2.9

## 2019-05-06 LAB — NEUTROPHILS RELATIVE PERCENT: Neutrophils/100 leukocytes:NFr:Pt:Bld:Qn:Automated count: 82

## 2019-05-08 DIAGNOSIS — Z940 Kidney transplant status: Principal | ICD-10-CM

## 2019-05-08 DIAGNOSIS — D899 Disorder involving the immune mechanism, unspecified: Principal | ICD-10-CM

## 2019-05-08 LAB — TACROLIMUS BLOOD: Tacrolimus:MCnc:Pt:Bld:Qn:LC/MS/MS: 8.8

## 2019-05-09 DIAGNOSIS — Z940 Kidney transplant status: Principal | ICD-10-CM

## 2019-05-09 DIAGNOSIS — D899 Disorder involving the immune mechanism, unspecified: Principal | ICD-10-CM

## 2019-05-09 LAB — RENAL FUNCTION PANEL
ALBUMIN: 4.2 g/dL (ref 3.9–5.0)
BLOOD UREA NITROGEN: 17 mg/dL (ref 6–20)
BUN / CREAT RATIO: 21 (ref 9–23)
CALCIUM: 9.9 mg/dL (ref 8.7–10.2)
CHLORIDE: 108 mmol/L — ABNORMAL HIGH (ref 96–106)
CO2: 19 mmol/L — ABNORMAL LOW (ref 20–29)
CREATININE: 0.8 mg/dL (ref 0.57–1.00)
GFR MDRD AF AMER: 115 mL/min/{1.73_m2}
GFR MDRD NON AF AMER: 100 mL/min/{1.73_m2}
GLUCOSE: 89 mg/dL (ref 65–99)
PHOSPHORUS, SERUM: 4 mg/dL (ref 3.0–4.3)
POTASSIUM: 5.1 mmol/L (ref 3.5–5.2)

## 2019-05-09 LAB — CBC W/ DIFFERENTIAL
BANDED NEUTROPHILS ABSOLUTE COUNT: 0.1 10*3/uL (ref 0.0–0.1)
BASOPHILS ABSOLUTE COUNT: 0 10*3/uL (ref 0.0–0.2)
BASOPHILS RELATIVE PERCENT: 1 %
EOSINOPHILS ABSOLUTE COUNT: 0.1 10*3/uL (ref 0.0–0.4)
HEMATOCRIT: 32.4 % — ABNORMAL LOW (ref 34.0–46.6)
HEMOGLOBIN: 10.5 g/dL — ABNORMAL LOW (ref 11.1–15.9)
LYMPHOCYTES ABSOLUTE COUNT: 0.2 10*3/uL — ABNORMAL LOW (ref 0.7–3.1)
LYMPHOCYTES RELATIVE PERCENT: 7 %
MEAN CORPUSCULAR HEMOGLOBIN CONC: 32.4 g/dL (ref 31.5–35.7)
MEAN CORPUSCULAR HEMOGLOBIN: 32.2 pg (ref 26.6–33.0)
MEAN CORPUSCULAR VOLUME: 99 fL — ABNORMAL HIGH (ref 79–97)
MONOCYTES ABSOLUTE COUNT: 0.2 10*3/uL (ref 0.1–0.9)
MONOCYTES RELATIVE PERCENT: 6 %
NEUTROPHILS RELATIVE PERCENT: 82 %
PLATELET COUNT: 283 10*3/uL (ref 150–450)
RED BLOOD CELL COUNT: 3.26 x10E6/uL — ABNORMAL LOW (ref 3.77–5.28)
RED CELL DISTRIBUTION WIDTH: 12.4 % (ref 11.7–15.4)
WHITE BLOOD CELL COUNT: 2.9 10*3/uL — ABNORMAL LOW (ref 3.4–10.8)

## 2019-05-09 LAB — PROTEIN / CREATININE RATIO, URINE: PROTEIN/CREAT RATIO: 63 mg/g{creat} (ref 0–200)

## 2019-05-09 LAB — CREATININE URINE: Creatinine:MCnc:Pt:Urine:Qn:: 90.9

## 2019-05-09 LAB — RED BLOOD CELL COUNT: Erythrocytes:NCnc:Pt:Bld:Qn:Automated count: 3.26 — ABNORMAL LOW

## 2019-05-09 LAB — BLOOD UREA NITROGEN: Urea nitrogen:MCnc:Pt:Ser/Plas:Qn:: 17

## 2019-05-09 LAB — MAGNESIUM: Magnesium:MCnc:Pt:Ser/Plas:Qn:: 1.7

## 2019-05-11 DIAGNOSIS — D899 Disorder involving the immune mechanism, unspecified: Principal | ICD-10-CM

## 2019-05-11 DIAGNOSIS — Z940 Kidney transplant status: Principal | ICD-10-CM

## 2019-05-11 LAB — CBC W/ DIFFERENTIAL
BANDED NEUTROPHILS ABSOLUTE COUNT: 0.1 10*3/uL (ref 0.0–0.1)
BASOPHILS ABSOLUTE COUNT: 0 10*3/uL (ref 0.0–0.2)
BASOPHILS RELATIVE PERCENT: 1 %
EOSINOPHILS ABSOLUTE COUNT: 0.1 10*3/uL (ref 0.0–0.4)
EOSINOPHILS RELATIVE PERCENT: 2 %
IMMATURE GRANULOCYTES: 4 %
LYMPHOCYTES ABSOLUTE COUNT: 0.2 10*3/uL — ABNORMAL LOW (ref 0.7–3.1)
LYMPHOCYTES RELATIVE PERCENT: 6 %
MEAN CORPUSCULAR HEMOGLOBIN CONC: 32.7 g/dL (ref 31.5–35.7)
MEAN CORPUSCULAR HEMOGLOBIN: 32.3 pg (ref 26.6–33.0)
MEAN CORPUSCULAR VOLUME: 99 fL — ABNORMAL HIGH (ref 79–97)
MONOCYTES ABSOLUTE COUNT: 0.2 10*3/uL (ref 0.1–0.9)
MONOCYTES RELATIVE PERCENT: 8 %
NEUTROPHILS ABSOLUTE COUNT: 2 10*3/uL (ref 1.4–7.0)
NEUTROPHILS RELATIVE PERCENT: 79 %
PLATELET COUNT: 269 10*3/uL (ref 150–450)
RED BLOOD CELL COUNT: 3.19 x10E6/uL — ABNORMAL LOW (ref 3.77–5.28)
RED CELL DISTRIBUTION WIDTH: 12.1 % (ref 11.7–15.4)
WHITE BLOOD CELL COUNT: 2.5 10*3/uL — CL (ref 3.4–10.8)

## 2019-05-11 LAB — MAGNESIUM
MAGNESIUM: 1.7 mg/dL (ref 1.6–2.3)
Magnesium:MCnc:Pt:Ser/Plas:Qn:: 1.7

## 2019-05-11 LAB — GLUCOSE: Glucose:MCnc:Pt:Ser/Plas:Qn:: 89

## 2019-05-11 LAB — RENAL FUNCTION PANEL
ALBUMIN: 4.3 g/dL (ref 3.9–5.0)
BLOOD UREA NITROGEN: 14 mg/dL (ref 6–20)
BUN / CREAT RATIO: 18 (ref 9–23)
CALCIUM: 10.2 mg/dL (ref 8.7–10.2)
CHLORIDE: 105 mmol/L (ref 96–106)
CO2: 20 mmol/L (ref 20–29)
CREATININE: 0.8 mg/dL (ref 0.57–1.00)
GFR MDRD AF AMER: 115 mL/min/{1.73_m2}
GLUCOSE: 89 mg/dL (ref 65–99)
PHOSPHORUS, SERUM: 4 mg/dL (ref 3.0–4.3)
SODIUM: 138 mmol/L (ref 134–144)

## 2019-05-11 LAB — TACROLIMUS BLOOD: Tacrolimus:MCnc:Pt:Bld:Qn:LC/MS/MS: 11.3

## 2019-05-11 LAB — NEUTROPHILS ABSOLUTE COUNT: Neutrophils:NCnc:Pt:Bld:Qn:Automated count: 2

## 2019-05-12 DIAGNOSIS — Z940 Kidney transplant status: Principal | ICD-10-CM

## 2019-05-12 DIAGNOSIS — Z79899 Other long term (current) drug therapy: Principal | ICD-10-CM

## 2019-05-12 LAB — TACROLIMUS BLOOD: Tacrolimus:MCnc:Pt:Bld:Qn:LC/MS/MS: 6

## 2019-05-12 NOTE — Unmapped (Signed)
Called patient to check in. Reports she is doing well. No complaints. Let her know we are waiting for last tac level since the one prior was a little above her range.  She is getting labs again today and then Tuesday in clinic. Denies any HA or tremors.    Let her know I will call her if we need to make changes or I would see her in clinic Tuesday.    Denies any other needs

## 2019-05-13 LAB — RENAL FUNCTION PANEL
ALBUMIN: 4 g/dL (ref 3.9–5.0)
BLOOD UREA NITROGEN: 17 mg/dL (ref 6–20)
BUN / CREAT RATIO: 25 — ABNORMAL HIGH (ref 9–23)
CHLORIDE: 105 mmol/L (ref 96–106)
CO2: 20 mmol/L (ref 20–29)
CREATININE: 0.67 mg/dL (ref 0.57–1.00)
GFR MDRD AF AMER: 137 mL/min/{1.73_m2}
GLUCOSE: 83 mg/dL (ref 65–99)
PHOSPHORUS, SERUM: 4.1 mg/dL (ref 3.0–4.3)
POTASSIUM: 4.8 mmol/L (ref 3.5–5.2)
SODIUM: 138 mmol/L (ref 134–144)

## 2019-05-13 LAB — CBC W/ DIFFERENTIAL
BANDED NEUTROPHILS ABSOLUTE COUNT: 0 10*3/uL (ref 0.0–0.1)
BASOPHILS ABSOLUTE COUNT: 0 10*3/uL (ref 0.0–0.2)
EOSINOPHILS ABSOLUTE COUNT: 0.1 10*3/uL (ref 0.0–0.4)
EOSINOPHILS RELATIVE PERCENT: 3 %
HEMATOCRIT: 31.2 % — ABNORMAL LOW (ref 34.0–46.6)
HEMOGLOBIN: 10.3 g/dL — ABNORMAL LOW (ref 11.1–15.9)
IMMATURE GRANULOCYTES: 2 %
LYMPHOCYTES ABSOLUTE COUNT: 0.2 10*3/uL — ABNORMAL LOW (ref 0.7–3.1)
LYMPHOCYTES RELATIVE PERCENT: 9 %
MEAN CORPUSCULAR HEMOGLOBIN CONC: 33 g/dL (ref 31.5–35.7)
MEAN CORPUSCULAR HEMOGLOBIN: 32.6 pg (ref 26.6–33.0)
MEAN CORPUSCULAR VOLUME: 99 fL — ABNORMAL HIGH (ref 79–97)
MONOCYTES ABSOLUTE COUNT: 0.2 10*3/uL (ref 0.1–0.9)
NEUTROPHILS ABSOLUTE COUNT: 1.6 10*3/uL (ref 1.4–7.0)
NEUTROPHILS RELATIVE PERCENT: 75 %
PLATELET COUNT: 260 10*3/uL (ref 150–450)
RED BLOOD CELL COUNT: 3.16 x10E6/uL — ABNORMAL LOW (ref 3.77–5.28)
RED CELL DISTRIBUTION WIDTH: 12.2 % (ref 11.7–15.4)
WHITE BLOOD CELL COUNT: 2.1 10*3/uL — CL (ref 3.4–10.8)

## 2019-05-13 LAB — RED BLOOD CELL COUNT: Erythrocytes:NCnc:Pt:Bld:Qn:Automated count: 3.16 — ABNORMAL LOW

## 2019-05-13 LAB — MAGNESIUM: Magnesium:MCnc:Pt:Ser/Plas:Qn:: 1.6

## 2019-05-13 LAB — PHOSPHORUS, SERUM: Phosphate:MCnc:Pt:Ser/Plas:Qn:: 4.1

## 2019-05-14 LAB — TACROLIMUS BLOOD: Tacrolimus:MCnc:Pt:Bld:Qn:LC/MS/MS: 5.4

## 2019-05-14 LAB — TACROLIMUS LEVEL: TACROLIMUS BLOOD: 5.4 ng/mL (ref 2.0–20.0)

## 2019-05-15 DIAGNOSIS — D899 Disorder involving the immune mechanism, unspecified: Principal | ICD-10-CM

## 2019-05-15 DIAGNOSIS — Z940 Kidney transplant status: Principal | ICD-10-CM

## 2019-05-15 NOTE — Unmapped (Signed)
[facility] CLINIC PHARMACY NOTE  05/15/2019   Carolyn Kelley  [id number]    Medication changes today:   1. None    Education/Adherence tools provided today:  1.provided updated medication list  2. provided additional pill box education  3.  provided additional education on immunosuppression and transplant related medications including reviewing indications of medications, dosing and side effects    Follow up items:  1. goal of understanding indications and dosing of immunosuppression medications  2. Calcium off of Sensipar  3. VitD in 2 months (April 2021)  4. Continue to monitor WBC/ANC - if downtrends consider decreasing Myfortic dose     Next visit with pharmacy in 1 month  ____________________________________________________________________    Carolyn Kelley is a 29 y.o. 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 Bee Pollen Swelling     Sneezing, congestion  denies swelling.  Sneezing, congestion  denies swelling.     ??? Pollen Extracts Swelling       All medications reviewed and updated.     Medication list includes revisions made during today???s encounter    Outpatient Encounter Medications as of 05/16/2019   Medication Sig Dispense Refill   ??? acetaminophen (TYLENOL) 500 MG tablet Take 1-2 tablets (500-1,000 mg total) by mouth every six (6) hours as needed for pain or fever (> 38C). 100 tablet 0   ??? aspirin (ECOTRIN) 81 MG tablet Take 1 tablet (81 mg total) by mouth daily. 30 tablet 11   ??? clonazePAM (KLONOPIN) 0.5 MG tablet Take 1 tablet (0.5 mg total) by mouth Two (2) times a day. 60 tablet 5   ??? docusate sodium (COLACE) 100 MG capsule Take 1 capsule (100 mg total) by mouth two (2) times a day as needed for constipation. 60 capsule 0   ??? ergocalciferol (DRISDOL) 1,250 mcg (50,000 unit) capsule Take 1 capsule (50,000 Units total) by mouth once a week. 4 capsule 2   ??? famotidine (PEPCID) 20 MG tablet Take 1 tablet (20 mg total) by mouth daily as needed for heartburn. 30 tablet 11   ??? levETIRAcetam (KEPPRA) 250 MG tablet Take 1 tablet (250 mg total) by mouth Two (2) times a day. 180 tablet 3   ??? magnesium oxide-Mg AA chelate (MAGNESIUM, AMINO ACID CHELATE,) 133 mg Tab Take 2 tablets by mouth Two (2) times a day. 120 tablet 11   ??? mycophenolate (MYFORTIC) 180 MG EC tablet Take 3 tablets (540 mg total) by mouth Two (2) times a day. Adjust dose per medication card. 180 tablet 11   ??? polyethylene glycol (MIRALAX) 17 gram packet Mix 1 packet (17 g) in 8oz of water, tea, coffee, or juice and drink by mouth daily as needed (constipation). 30 each 0   ??? sulfamethoxazole-trimethoprim (BACTRIM) 400-80 mg per tablet Take 1 tablet (80 mg of trimethoprim total) by mouth Every Monday, Wednesday, and Friday. 12 tablet 5   ??? tacrolimus (ENVARSUS XR) 1 mg Tb24 extended  release tablet Take 3 tablets (3 mg total) by mouth daily with 1 (4 mg) tablet for total dose of 7 mg daily 90 tablet 11   ??? tacrolimus (ENVARSUS XR) 4 mg Tb24 extended release tablet Take 1 tablet (4 mg total) by mouth daily. 30 tablet 11   ??? traMADoL (ULTRAM) 50 mg tablet Take 1-2 tablets (50-100 mg total) by mouth every twelve (12) hours as needed for pain. 30 tablet 0   ??? valGANciclovir (VALCYTE) 450 mg tablet Take 1 tablet (450 mg total) by mouth daily. 30 tablet 2     No facility-administered encounter medications on file as of 05/16/2019.        Induction agent : alemtuzumab    CURRENT IMMUNOSUPPRESSION: Envarsus 7 mg PO qd  prograf/Envarsus/cyclosporine goal: 8-10   [id number]  mg PO bid    steroid free     Patient is tolerating immunosuppression well. Already had tremors pre transplant.    IMMUNOSUPPRESSION DRUG LEVELS:  Lab Results   Component Value Date    Tacrolimus, Trough 6.1 04/18/2019    Tacrolimus, Trough 6.7 03/23/2019    Tacrolimus, Trough 8.8 03/20/2019    Tacrolimus Lvl 5.4 05/12/2019    Tacrolimus Lvl 6.0 05/10/2019    Tacrolimus Lvl 11.3 05/08/2019     No results found for: CYCLO  No results found for: EVEROLIMUS  No results found for: SIROLIMUS    Envarsus level is accurate 24 hour trough, takes medication at 11am    Graft function: stable  DSA: ntd  Zero hour biopsy: without diagnostic abnormalities  Biopsies to date: ntd (04/18/19)  UPC: not detected 05/16/19  WBC/ANC:  low but stable    Plan: Will maintain current immunosuppression pending Envarsus level today. Continue to monitor closely.    OI Prophylaxis:   CMV Status: D+/ R+, moderate risk . CMV prophylaxis: valganciclovir 450 mg daily x 3 months per protocol. End date: June 08 2019  No results found for: CMVCP   Estimated Creatinine Clearance: 94.3 mL/min (based on SCr of 0.76 mg/dL).  PCP Prophylaxis: bactrim SS 1 tab MWF x 6 months. End date: September 08 2019  Thrush: completed in hospital  Patient is  leukopenic    Plan: Continue per protocol. Continue to monitor.    CV Prophylaxis: asa 81 mg   The ASCVD Risk score Denman George DC Montez Hageman, et al., 2013) failed to calculate.  Statin therapy: Not indicated  Plan:  Continue to monitor     BP: Goal < 140/90. Clinic vitals reported above  Home BP ranges: 115-120s/80s  Current meds include: none  Plan: within goal. Continue to monitor    Anemia of CKD:  H/H:   Lab Results   Component Value Date    HGB 10.3 (L) 05/12/2019     Lab Results   Component Value Date    HCT 31.2 (L) 05/12/2019     Iron panel:  Lab Results   Component Value Date    IRON 55 12/23/2015    TIBC 213.1 (L) 12/23/2015    FERRITIN 588.0 (H) 12/23/2015     Lab Results   Component Value Date    Iron Saturation (%) 26 12/23/2015    Iron Saturation (%) 26 04/29/2011     Prior ESA use: none post transplant  Plan: stable. Continue to monitor.     DM:   Lab Results   Component Value Date    A1C 4.6 (L) 03/09/2019   . Goal A1c < 7  FBG: mostly in  80s  History of Dm? No  Plan:  Continue to monitor    Fluid Intake: 64 oz daily  Plan: Continue adequate fluid intake    Electrolytes: Mag is low 1.5, K 5.2, calcium 10.2  Meds currently on: Mg plus protein 266 mg BID  Plan: Continue to monitor.    GI/BM: pt reports 1-2BM daily, occasional diarrhea (1x/week)  Meds currently on: docusate PRN (not using), Miralax PRN (not using), famotidine 20 mg daily prn (No recent use)  Plan: Continue to monitor     Pain: pt reports no pain  Meds currently on: APAP PRN (2 tabs yesterday for a HA)  Plan: Continue to monitor    Bone health:   Vitamin D Level: last level is 10.9 on 03/23/19. Goal > 30.   Last DEXA results:  none available  Current meds include: ergocalciferol 50,000 units weekly x12 weeks (started on 1/26).  Plan: Vitamin D level  out of goal. Continue supplementation. Contacted [facility] to send refill for ergocalciferol. Continue to monitor.     Women's/Men's Health:  Carolyn Kelley is a 29 y.o. Female of childbearing age. Patient reports no men's/women's health issues.   Plan: Continue to monitor. Reports she is not sexually active at this time and understands that she should inform us if she were to become sexually active given need for contraception with Myfortic.    Seizure history - last seizure while hospitalized in 2014  Meds currently on: Keppra 250 mg BID, clonazepam 0.5 mg BID  Plan: continue to monitor    Pharmacy preference:  [facility]    Adherence: Patient has average understanding of medications; was able to independently identify names/doses of immunosuppressants and OI meds.  Patient  does fill their own pill box on a regular basis at home.  Patient brought medication card:yes  Pill box:was correct  Plan: provided moderate adherence counseling/intervention    Spent approximately 25 minutes on educating this patient and greater than 50% was spent in direct face to face counseling regarding post transplant medication education. Questions and concerns were address to patient's satisfaction.    Patient was reviewed with Dr. Silvio Pate Dr. Carlene Coria who was agreement with the stated plan:     During this visit, the following was completed:   BP log data assessment  Labs ordered and evaluated  complex treatment plan >1 DS   Patient education was completed for 11-24 minutes     All questions/concerns were addressed to the patient's satisfaction.    Jonette Pesa, PharmD  PGY2 Solid Organ Transplant Pharmacy Resident  __________________________________________    Cleone Slim, PHARMD, CPP  SOLID ORGAN TRANSPLANT CLINICAL PHARMACIST PRACTITIONER  PAGER [phone]

## 2019-05-15 NOTE — Unmapped (Signed)
[facility] Refill Coordination Note    Specialty Medication(s) to be Shipped:   Transplant: Envarsus 1mg  and Envarsus 4mg     Other medication(s) to be shipped: n/a     Carolyn Kelley, DOB: 08-06-1990  Phone: [phone] (home)       All above HIPAA information was verified with patient.     Was a Nurse, learning disability used for this call? No    Completed refill call assessment today to schedule patient's medication shipment from the [facility] [phone]).       Specialty medication(s) and dose(s) confirmed: Regimen is correct and unchanged.   Changes to medications: Alis reports no changes at this time.  Changes to insurance: No  Questions for the pharmacist: No    Confirmed patient received Welcome Packet with first shipment. The patient will receive a drug information handout for each medication shipped and additional FDA Medication Guides as required.       DISEASE/MEDICATION-SPECIFIC INFORMATION        N/A    SPECIALTY MEDICATION ADHERENCE     Medication Adherence    Patient reported X missed doses in the last month: 0  Specialty Medication: Envarsus 1  Patient is on additional specialty medications: Yes  Additional Specialty Medications: Envarsus 4  Patient Reported Additional Medication X Missed Doses in the Last Month: 0  Patient is on more than two specialty medications: No  Any gaps in refill history greater than 2 weeks in the last 3 months: no  Demonstrates understanding of importance of adherence: yes  Informant: patient                Envarsus 1mg : Patient has 5 days of medication on hand    Envarsus 4mg : Patient has 5 days of medication on hand       SHIPPING     Shipping address confirmed in Epic.     Delivery Scheduled: Yes, Expected medication delivery date: 2/24.     Medication will be delivered via UPS to the prescription address in Epic WAM.    Olga Millers   [facility] Specialty Technician

## 2019-05-16 ENCOUNTER — Encounter: Admit: 2019-05-16 | Discharge: 2019-05-17 | Payer: PRIVATE HEALTH INSURANCE

## 2019-05-16 DIAGNOSIS — Z940 Kidney transplant status: Principal | ICD-10-CM

## 2019-05-16 DIAGNOSIS — D899 Disorder involving the immune mechanism, unspecified: Principal | ICD-10-CM

## 2019-05-16 LAB — URINALYSIS
BILIRUBIN UA: NEGATIVE
BLOOD UA: NEGATIVE
GLUCOSE UA: NEGATIVE
KETONES UA: NEGATIVE
LEUKOCYTE ESTERASE UA: NEGATIVE
NITRITE UA: NEGATIVE
PH UA: 5 (ref 5.0–9.0)
PROTEIN UA: NEGATIVE
RBC UA: 1 /HPF (ref <=4)
SPECIFIC GRAVITY UA: 1.01 (ref 1.003–1.030)
SQUAMOUS EPITHELIAL: 1 /HPF (ref 0–5)
UROBILINOGEN UA: 0.2

## 2019-05-16 LAB — TACROLIMUS, TROUGH: Lab: 9.4

## 2019-05-16 LAB — CBC W/ AUTO DIFF
BASOPHILS ABSOLUTE COUNT: 0 10*9/L (ref 0.0–0.1)
BASOPHILS RELATIVE PERCENT: 1.7 %
EOSINOPHILS ABSOLUTE COUNT: 0.1 10*9/L (ref 0.0–0.4)
EOSINOPHILS RELATIVE PERCENT: 3.1 %
HEMATOCRIT: 35.2 % — ABNORMAL LOW (ref 36.0–46.0)
LARGE UNSTAINED CELLS: 3 % (ref 0–4)
LYMPHOCYTES ABSOLUTE COUNT: 0.2 10*9/L — ABNORMAL LOW (ref 1.5–5.0)
LYMPHOCYTES RELATIVE PERCENT: 7.7 %
MEAN CORPUSCULAR HEMOGLOBIN CONC: 31.9 g/dL (ref 31.0–37.0)
MEAN CORPUSCULAR HEMOGLOBIN: 32.8 pg (ref 26.0–34.0)
MEAN CORPUSCULAR VOLUME: 102.9 fL — ABNORMAL HIGH (ref 80.0–100.0)
MEAN PLATELET VOLUME: 8.6 fL (ref 7.0–10.0)
MONOCYTES ABSOLUTE COUNT: 0.2 10*9/L (ref 0.2–0.8)
MONOCYTES RELATIVE PERCENT: 8.7 %
NEUTROPHILS ABSOLUTE COUNT: 1.6 10*9/L — ABNORMAL LOW (ref 2.0–7.5)
PLATELET COUNT: 245 10*9/L (ref 150–440)
RED BLOOD CELL COUNT: 3.42 10*12/L — ABNORMAL LOW (ref 4.00–5.20)
RED CELL DISTRIBUTION WIDTH: 14.3 % (ref 12.0–15.0)
WBC ADJUSTED: 2.1 10*9/L — ABNORMAL LOW (ref 4.5–11.0)

## 2019-05-16 LAB — CLARITY

## 2019-05-16 LAB — MONOCYTES RELATIVE PERCENT: Monocytes/100 leukocytes:NFr:Pt:Bld:Qn:Automated count: 8.7

## 2019-05-16 LAB — PHOSPHORUS: Phosphate:MCnc:Pt:Ser/Plas:Qn:: 4.4

## 2019-05-16 LAB — BASIC METABOLIC PANEL
ANION GAP: 14 mmol/L (ref 7–15)
BLOOD UREA NITROGEN: 21 mg/dL (ref 7–21)
BUN / CREAT RATIO: 28
CHLORIDE: 106 mmol/L (ref 98–107)
CO2: 20 mmol/L — ABNORMAL LOW (ref 22.0–30.0)
CREATININE: 0.76 mg/dL (ref 0.60–1.00)
EGFR CKD-EPI AA FEMALE: >90 mL/min/{1.73_m2} (ref >=60)
EGFR CKD-EPI NON-AA FEMALE: >90 mL/min/{1.73_m2} (ref >=60)
POTASSIUM: 5.2 mmol/L — ABNORMAL HIGH (ref 3.5–5.0)
SODIUM: 140 mmol/L (ref 135–145)

## 2019-05-16 LAB — PROTEIN/CREAT RATIO, URINE: Protein/Creatinine:MRto:Pt:Urine:Qn:: 0

## 2019-05-16 LAB — MAGNESIUM: Magnesium:MCnc:Pt:Ser/Plas:Qn:: 1.5 — ABNORMAL LOW

## 2019-05-16 LAB — BURR CELLS

## 2019-05-16 LAB — SODIUM: Sodium:SCnc:Pt:Ser/Plas:Qn:: 140

## 2019-05-16 MED FILL — ENVARSUS XR 4 MG TABLET,EXTENDED RELEASE: 30 days supply | Qty: 30 | Fill #1 | Status: AC

## 2019-05-16 MED FILL — ERGOCALCIFEROL (VITAMIN D2) 1,250 MCG (50,000 UNIT) CAPSULE: ORAL | 28 days supply | Qty: 4 | Fill #1

## 2019-05-16 MED FILL — ENVARSUS XR 1 MG TABLET,EXTENDED RELEASE: 30 days supply | Qty: 90 | Fill #1 | Status: AC

## 2019-05-16 MED FILL — ENVARSUS XR 1 MG TABLET,EXTENDED RELEASE: ORAL | 30 days supply | Qty: 90 | Fill #1

## 2019-05-16 MED FILL — CLONAZEPAM 0.5 MG TABLET: ORAL | 30 days supply | Qty: 60 | Fill #1

## 2019-05-16 MED FILL — ERGOCALCIFEROL (VITAMIN D2) 1,250 MCG (50,000 UNIT) CAPSULE: 28 days supply | Qty: 4 | Fill #1 | Status: AC

## 2019-05-16 MED FILL — CLONAZEPAM 0.5 MG TABLET: 30 days supply | Qty: 60 | Fill #1 | Status: AC

## 2019-05-16 MED FILL — ENVARSUS XR 4 MG TABLET,EXTENDED RELEASE: ORAL | 30 days supply | Qty: 30 | Fill #1

## 2019-05-16 NOTE — Unmapped (Signed)
Transplant Nephrology Clinic Visit      Subjective/Interval:   Patient doing well, no acute issues. Taking tacro at 11 am and empty stomach but lvls fluctuating. Other wise some headaches at times in evening but attribute to too much screen time from school work.  Patient not demonstrating any symptoms of drug toxicity.  As of now patient with no acute issues, no new complaints, no fever chills or sweats. no chest pain palpitations orthopnea or shortness of breath. no lightheaded. no lower extremity edema. no abdominal pain no n/v/d. no myalgias or arthralgias. no dysuria hematuria or difficulty voiding.Other ros mostly negative, no hospital admission, no ED visits, no new physicians and no new medicines.    Last dose of Envarsus: 11 AM      Assessment:  29 y.o. 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         Recommendations/Plan:     Allograft Function: DDKT  03/10/19 KDPI 2%   Renal function holding relatively stable with electrolytes and acid base balanced. No rejection episode since transplant. There was no DGF / prompt renal function and good rate of recovery.     Baseline Cr: ~ 0.7  Last Cr: 0.67 Date: 05/12/19   [place]/ BK     neg Date:  04/18/19   DSA     neg Date:  04/18/19  CMV:     neg Date:   04/18/19  UPC:     0.05 Date:    04/18/19    Immunosuppression Management [High Risk Medical Decision Making For Drug Therapy Requiring Intensive Monitoring For Toxicity]:     Tacro target: 8-10  Tacro dose: envar 4mg    Tacro last lvl /date: 5.4 // 05-12-19   Last date adjusted: 04/12/19 6>4  MMF/MPA dose: 540  Wbc/anc count: 2.1/1.6  Side effects: none     Blood Pressure Management:  BP holding well off of bp meds     Lipid Management: Last lipid panel on 02/25/2018.  Patient is currently not on a statin.  Current The ASCVD Risk score Denman George DC Jorge Ny al., 2013) failed to calculate. Electrolytes: Electrolyte and metabolic parameters in acceptable range.  continue ergocalciferol 50,000 units today.     Infectious Prophylaxis and Monitoring: CMV & EBV: D+/R+  The patient continues on Valcyte and Bactrim prophylaxis.     Anemia of CKD: Hgb holding around ~ 10 range .  Last dose of Aranesp 03/07/19.     Seizures: Continue on Keppra.  Last seizure in 2014. Follows with [name], NP at [facility].  Last visit on 05/23/2018, with yearly follow ups.     Myoclonus: Continue on Klonopin.  Follows with [facility].  Continue to use walker and cane. Last see on 10/19/18. F/U 1 year.      Health Maintenance:   Pap Smear: 12/08/18 showed HSIL [facility]) - Repeat 1 year  Discussed need for contraception and pregnancy prevention due to medications.  Patient informed to let us know when she is thinking about getting pregnant so we can adjust medications.   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  Colposcopy on 12/21/18 showed low-grade squamous intraepithelial lesion and a small detached fragment of dysplastic epithelium, quantitatively insufficient for grading (high grade dysplasia no excluded).  Patient to repeat Pap smear in 1 year.     Immunizations:   Flu Shot:  12/15/18  Prevnar 13: 02/23/19  Pneumovax: 02/04/12  No immunizations till 1 year post transplant.    History of Present Illness    29 y.o. female here for follow up after kidney transplantation.  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  Patient has been on dialysis since 12/01/2011.  Patient's history includes Seizure disorder, PEA arrest with AKI in 2013, HTN, Myoclonus. Patient was admitted for kidney transplant on 03/09/2019.  Kidney transplant operation complicated by hypotension and hypoxia thought to be caused by anaphylactic reaction to Campath. She received 6 puffs of Albuterol and Epinephrine and recovered. After extubation she received another dose of Epinephrine for hypotension.  She received Albumin and Crystalloid in the OR.  Patient discharged on 03/13/19      Transplant History:    Organ Received: Left DDKT, DBD, SCD, PHS, KDPI 2%; cold ischemia 18 hrs.  Native Kidney Disease: C3 glomerulonephropathy, cPRA: 0%  Date of Transplant: 03/10/19  Post-Transplant Course: Complicated by Anaphylactic reaction to Campath in OR  Prior Transplants: None  Induction: Campath  Date of Ureteral Stent Removal: 04/17/19  CMV/EBV Status: CMV D+/R+, EBV D+/R+  Rejection Episodes: None  Donor Specific Antibodies: None  Results of Renal Imaging (pre and post):     Pre-Txp 01/18/17  Kidneys are small and echogenic bilaterally. There is a renal cyst within the right interpolar region measuring 1.4 x 1.3 x 1.1 cm, previously 0.9 x 0.9 x 0.9 cm. A second, smaller cyst in the interpolar region measures 1.1 x 1.1 x 1.1 cm. Within the left interpolar region, there is a cystic lesion with tiny peripheral echogenic focus measuring 0.7 x 0.8 x 0.7 cm. Multiple other small subcentimeter hypodensities are seen, too small to characterize. No hydronephrosis or renal calculi.     CT Renal Mass Protocol 02/25/2018  Symmetric renal enhancement. Bilateral atrophic kidneys with multiple simple cysts. Additional subcentimeter hypodense lesions are too small to characterize. No enhancing soft tissue renal mass. No hydronephrosis or calculi.    Post-Txp 03/10/19 (txp kidney only)  The renal transplant was located in the right lower quadrant. Normal size and echogenicity.  No solid masses or calculi. No perinephric collections identified. No hydronephrosis  Resistive indices in the renal transplant are stable compared with prior examination      Past Medical History  1. C3 Glomerulopathy  2. Seizure Disorder  3. PEA Arrest 03/26/2011 with AKI   4. HTN  5. Myoclonus    Review of Systems    Otherwise as per HPI, all other systems reviewed and are negative.    Medications  Current Outpatient Medications Medication Sig Dispense Refill   ??? acetaminophen (TYLENOL) 500 MG tablet Take 1-2 tablets (500-1,000 mg total) by mouth every six (6) hours as needed for pain or fever (> 38C). 100 tablet 0   ??? aspirin (ECOTRIN) 81 MG tablet Take 1 tablet (81 mg total) by mouth daily. 30 tablet 11   ??? clonazePAM (KLONOPIN) 0.5 MG tablet Take 1 tablet (0.5 mg total) by mouth Two (2) times a day. 60 tablet 5   ??? docusate sodium (COLACE) 100 MG capsule Take 1 capsule (100 mg total) by mouth two (2) times a day as needed for constipation. 60 capsule 0   ??? ergocalciferol (DRISDOL) 1,250 mcg (50,000 unit) capsule Take 1 capsule (50,000 Units total) by mouth once a week. 4 capsule 2   ??? famotidine (PEPCID) 20 MG tablet Take 1 tablet (20 mg total) by mouth daily as needed for heartburn. 30  tablet 11   ??? levETIRAcetam (KEPPRA) 250 MG tablet Take 1 tablet (250 mg total) by mouth Two (2) times a day. 180 tablet 3   ??? magnesium oxide-Mg AA chelate (MAGNESIUM, AMINO ACID CHELATE,) 133 mg Tab Take 2 tablets by mouth Two (2) times a day. 120 tablet 11   ??? mycophenolate (MYFORTIC) 180 MG EC tablet Take 3 tablets (540 mg total) by mouth Two (2) times a day. Adjust dose per medication card. 180 tablet 11   ??? polyethylene glycol (MIRALAX) 17 gram packet Mix 1 packet (17 g) in 8oz of water, tea, coffee, or juice and drink by mouth daily as needed (constipation). 30 each 0   ??? sulfamethoxazole-trimethoprim (BACTRIM) 400-80 mg per tablet Take 1 tablet (80 mg of trimethoprim total) by mouth Every Monday, Wednesday, and Friday. 12 tablet 5   ??? tacrolimus (ENVARSUS XR) 1 mg Tb24 extended release tablet Take 3 tablets (3 mg total) by mouth daily with 1 (4 mg) tablet for total dose of 7 mg daily 90 tablet 11   ??? tacrolimus (ENVARSUS XR) 4 mg Tb24 extended release tablet Take 1 tablet (4 mg total) by mouth daily. 30 tablet 11   ??? traMADoL (ULTRAM) 50 mg tablet Take 1-2 tablets (50-100 mg total) by mouth every twelve (12) hours as needed for pain. 30 tablet 0 ??? valGANciclovir (VALCYTE) 450 mg tablet Take 1 tablet (450 mg total) by mouth daily. 30 tablet 2     No current facility-administered medications for this visit.            Physical Exam  There were no vitals taken for this visit.  General: no acute distress  HEENT: wearing mask, PERRL  Neck: neck supple, no cervical lymphadenopathy appreciated  CV: normal rate, normal rhythm, no murmur, no gallops, no rubs appreciated  Lungs: clear to auscultation bilaterally  Abdomen: soft, non tender, incision  c/d/i, without erythema, edema, or warmth  Extremities:  no edema,   Musculoskeletal: no visible deformity, normal range of motion.  Pulses: intact distally throughout  Neurologic: awake, alert, and oriented x3    Laboratory Data and Imaging reviewed in EPIC      Counseling:  I counseled the patient on:  The need to avoid sun exposure and the use of sunblock while outdoors given the relatively higher risk of skin malignancy in an immunosuppressed state.  The need for adherence to immunosuppression medication.  Patient verbalized understanding.     Follow-Up:  Return to clinic in 4 weeks  Labs: 3 times a week  Patient will continue to follow-up with her primary care provider for non-transplant related issues and medication refills. We have ordered transplant specific labs per the center's guidelines to monitor and assess for toxicities from immunosuppressant drug therapy

## 2019-05-16 NOTE — Unmapped (Signed)
Urine collected and sent to lab.

## 2019-05-16 NOTE — Unmapped (Signed)
Pt called this Clinical research associate and left VM requesting call back.     Called pt back and she did not answer, left VM for pt to call back if needed.       Lanelle Bal, RD, LDN  Abdominal Transplant Dietitian   Pager: [phone]

## 2019-05-18 DIAGNOSIS — Z940 Kidney transplant status: Principal | ICD-10-CM

## 2019-05-18 DIAGNOSIS — D899 Disorder involving the immune mechanism, unspecified: Principal | ICD-10-CM

## 2019-05-18 LAB — CMV QUANT LOG10: Lab: 2.35 — ABNORMAL HIGH

## 2019-05-18 LAB — CMV DNA, QUANTITATIVE, PCR: CMV QUANT: 224 [IU]/mL — ABNORMAL HIGH (ref <0)

## 2019-05-18 NOTE — Unmapped (Signed)
Pt called back and asked several food safety questions, which were answered to her satisfaction. Pt is ~2 months out from transplant, and she was unsure how long she was supposed to follow the food safety precautions. Recommended at least 3 months, but to always be cognizant of sanitation and expiration dates of her food long term.     Lanelle Bal, RD, LDN  Abdominal Transplant Dietitian   Pager: [phone]

## 2019-05-19 LAB — BK VIRUS QUANTITATIVE PCR, BLOOD: BK BLOOD RESULT: NOT DETECTED

## 2019-05-19 LAB — BK BLOOD LOG(10): Lab: 0

## 2019-05-20 LAB — RENAL FUNCTION PANEL
ALBUMIN: 4.5 g/dL (ref 3.9–5.0)
BUN / CREAT RATIO: 21 (ref 9–23)
CALCIUM: 9.9 mg/dL (ref 8.7–10.2)
CO2: 18 mmol/L — ABNORMAL LOW (ref 20–29)
CREATININE: 0.73 mg/dL (ref 0.57–1.00)
GFR MDRD AF AMER: 129 mL/min/{1.73_m2}
GFR MDRD NON AF AMER: 112 mL/min/{1.73_m2}
GLUCOSE: 84 mg/dL (ref 65–99)
PHOSPHORUS, SERUM: 3.8 mg/dL (ref 3.0–4.3)
POTASSIUM: 5 mmol/L (ref 3.5–5.2)
SODIUM: 138 mmol/L (ref 134–144)

## 2019-05-20 LAB — CBC W/ DIFFERENTIAL
BASOPHILS ABSOLUTE COUNT: 0 10*3/uL (ref 0.0–0.2)
BASOPHILS RELATIVE PERCENT: 1 %
EOSINOPHILS ABSOLUTE COUNT: 0.1 10*3/uL (ref 0.0–0.4)
EOSINOPHILS RELATIVE PERCENT: 4 %
HEMATOCRIT: 32.1 % — ABNORMAL LOW (ref 34.0–46.6)
HEMOGLOBIN: 10.9 g/dL — ABNORMAL LOW (ref 11.1–15.9)
LYMPHOCYTES ABSOLUTE COUNT: 0.2 10*3/uL — ABNORMAL LOW (ref 0.7–3.1)
LYMPHOCYTES RELATIVE PERCENT: 10 %
MEAN CORPUSCULAR HEMOGLOBIN CONC: 34 g/dL (ref 31.5–35.7)
MEAN CORPUSCULAR HEMOGLOBIN: 32.9 pg (ref 26.6–33.0)
MEAN CORPUSCULAR VOLUME: 97 fL (ref 79–97)
MONOCYTES ABSOLUTE COUNT: 0.2 10*3/uL (ref 0.1–0.9)
MONOCYTES RELATIVE PERCENT: 9 %
NEUTROPHILS ABSOLUTE COUNT: 1.5 10*3/uL (ref 1.4–7.0)
PLATELET COUNT: 221 10*3/uL (ref 150–450)
RED CELL DISTRIBUTION WIDTH: 11.9 % (ref 11.7–15.4)
WHITE BLOOD CELL COUNT: 2 10*3/uL — CL (ref 3.4–10.8)

## 2019-05-20 LAB — MAGNESIUM: Magnesium:MCnc:Pt:Ser/Plas:Qn:: 1.7

## 2019-05-20 LAB — MYELOCYTES: Myelocytes/100 leukocytes:NFr:Pt:Bld:Qn:: 3 — ABNORMAL HIGH

## 2019-05-20 LAB — LYMPHOCYTES RELATIVE PERCENT: Lymphocytes/100 leukocytes:NFr:Pt:Bld:Qn:Automated count: 10

## 2019-05-20 LAB — CO2: Carbon dioxide:SCnc:Pt:Ser/Plas:Qn:: 18 — ABNORMAL LOW

## 2019-05-21 LAB — TACROLIMUS BLOOD: Tacrolimus:MCnc:Pt:Bld:Qn:LC/MS/MS: 8

## 2019-05-22 DIAGNOSIS — D899 Disorder involving the immune mechanism, unspecified: Principal | ICD-10-CM

## 2019-05-22 DIAGNOSIS — Z940 Kidney transplant status: Principal | ICD-10-CM

## 2019-05-23 DIAGNOSIS — Z940 Kidney transplant status: Principal | ICD-10-CM

## 2019-05-23 DIAGNOSIS — D899 Disorder involving the immune mechanism, unspecified: Principal | ICD-10-CM

## 2019-05-23 LAB — CBC W/ DIFFERENTIAL
BASOPHILS RELATIVE PERCENT: 1 %
EOSINOPHILS ABSOLUTE COUNT: 0.1 10*3/uL (ref 0.0–0.4)
EOSINOPHILS RELATIVE PERCENT: 3 %
HEMATOCRIT: 31.9 % — ABNORMAL LOW (ref 34.0–46.6)
HEMOGLOBIN: 10.7 g/dL — ABNORMAL LOW (ref 11.1–15.9)
LYMPHOCYTES ABSOLUTE COUNT: 0.2 10*3/uL — ABNORMAL LOW (ref 0.7–3.1)
LYMPHOCYTES RELATIVE PERCENT: 7 %
MEAN CORPUSCULAR HEMOGLOBIN CONC: 33.5 g/dL (ref 31.5–35.7)
MEAN CORPUSCULAR HEMOGLOBIN: 32.8 pg (ref 26.6–33.0)
MEAN CORPUSCULAR VOLUME: 98 fL — ABNORMAL HIGH (ref 79–97)
MONOCYTES ABSOLUTE COUNT: 0.1 10*3/uL (ref 0.1–0.9)
MONOCYTES RELATIVE PERCENT: 6 %
NEUTROPHILS ABSOLUTE COUNT: 1.8 10*3/uL (ref 1.4–7.0)
NEUTROPHILS RELATIVE PERCENT: 82 %
PLATELET COUNT: 217 10*3/uL (ref 150–450)
RED BLOOD CELL COUNT: 3.26 x10E6/uL — ABNORMAL LOW (ref 3.77–5.28)
RED CELL DISTRIBUTION WIDTH: 11.7 % (ref 11.7–15.4)
WHITE BLOOD CELL COUNT: 2.2 10*3/uL — CL (ref 3.4–10.8)

## 2019-05-23 LAB — RENAL FUNCTION PANEL
ALBUMIN: 4.2 g/dL (ref 3.9–5.0)
BUN / CREAT RATIO: 23 (ref 9–23)
CALCIUM: 9.9 mg/dL (ref 8.7–10.2)
CHLORIDE: 107 mmol/L — ABNORMAL HIGH (ref 96–106)
CO2: 17 mmol/L — ABNORMAL LOW (ref 20–29)
GFR MDRD AF AMER: 119 mL/min/{1.73_m2}
GFR MDRD NON AF AMER: 103 mL/min/{1.73_m2}
GLUCOSE: 92 mg/dL (ref 65–99)
PHOSPHORUS, SERUM: 3.8 mg/dL (ref 3.0–4.3)
POTASSIUM: 5.1 mmol/L (ref 3.5–5.2)
SODIUM: 138 mmol/L (ref 134–144)

## 2019-05-23 LAB — BANDS: Neutrophils.band form/100 leukocytes:NFr:Pt:Bld:Qn:Automated count: 1

## 2019-05-23 LAB — BLOOD UREA NITROGEN: Urea nitrogen:MCnc:Pt:Ser/Plas:Qn:: 18

## 2019-05-23 LAB — NEUTROPHILS RELATIVE PERCENT: Neutrophils/100 leukocytes:NFr:Pt:Bld:Qn:Automated count: 82

## 2019-05-23 LAB — MAGNESIUM: Magnesium:MCnc:Pt:Ser/Plas:Qn:: 1.8

## 2019-05-24 ENCOUNTER — Ambulatory Visit: Payer: Medicaid Other | Admitting: Neurology

## 2019-05-24 ENCOUNTER — Other Ambulatory Visit: Payer: Self-pay

## 2019-05-24 ENCOUNTER — Encounter: Payer: Self-pay | Admitting: Neurology

## 2019-05-24 VITALS — BP 100/65 | HR 91 | Temp 97.5°F | Ht 62.0 in | Wt 139.0 lb

## 2019-05-24 DIAGNOSIS — G40409 Other generalized epilepsy and epileptic syndromes, not intractable, without status epilepticus: Secondary | ICD-10-CM

## 2019-05-24 LAB — TACROLIMUS BLOOD: Tacrolimus:MCnc:Pt:Bld:Qn:LC/MS/MS: 6.8

## 2019-05-24 MED ORDER — LEVETIRACETAM 250 MG PO TABS: ORAL_TABLET | ORAL | 3 refills | Status: DC

## 2019-05-24 MED ORDER — LEVETIRACETAM 250 MG TABLET
ORAL_TABLET | ORAL | 3 refills | 0 days
Start: 2019-05-24 — End: ?

## 2019-05-24 NOTE — Patient Instructions (Signed)
It was good to see you today Congratulations on your kidney transplant :) Continue taking Keppra as prescribed See you back in 1 year or sooner if needed, call for seizure

## 2019-05-24 NOTE — Progress Notes (Signed)
I have read the note, and I agree with the clinical assessment and plan.  Heather Sims   

## 2019-05-24 NOTE — Progress Notes (Signed)
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  She is taking Keppra 250 mg twice daily and is tolerating the medication well.  She has not had recurrent seizure.  She received a kidney transplant in December 2020.  She is back in school, finishing her degree in psychology.  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  Her rejection medications have possible side effect of tremor.  She has a walker for ambulation. She is learning to drive.  She wonders if she may be able to come off Keppra at some point she presents today for follow-up unaccompanied.  HISTORY 05/23/2018 SS: Heather Sims is a 29 year old female who presents for yearly follow-up with history of seizures.  She is on hemodialysis with end-stage renal disease.  She is currently taking Keppra 250 mg twice daily and is tolerating medication well.  She reports her last seizure was about 5 years ago.  She is currently on the kidney transplant list at [facility].  She does not drive a car but she reports she is learning.  She lives with her mom who brought her to this appointment today.  She denies any new problems or concerns.  She is using a walker today.  She reports she handles her own medications.  She presents today for follow-up.   REVIEW OF SYSTEMS: Out of a complete 14 system review of symptoms, the patient complains only of the following symptoms, and all other reviewed systems are negative.  Tremor, seizure  ALLERGIES: Allergies  Allergen Reactions  . Pollen Extract     Sneezing, congestion denies swelling.    HOME MEDICATIONS: Outpatient Medications Prior to Visit  Medication Sig Dispense Refill  . acetaminophen (TYLENOL) 325 MG tablet Take 2  tablets (650 mg total) by mouth every 4 (four) hours as needed (pain).    Marland Kitchen aspirin 81 MG EC tablet Take 81 mg by mouth daily.    . Cinacalcet HCl (SENSIPAR PO) Take 60 mg by mouth 2 (two) times daily.     . clonazePAM (KLONOPIN) 0.5 MG tablet Take 1 tablet (0.5 mg total) by mouth 2 (two) times daily. 60 tablet 3  . ergocalciferol (VITAMIN D2) 1.25 MG (50000 UT) capsule Take 50,000 Units by mouth once a week.    . famotidine (PEPCID) 20 MG tablet Take 20 mg by mouth daily as needed.    . mycophenolate (MYFORTIC) 180 MG EC tablet Take 3 tablets by mouth in the morning and at bedtime.    Marland Kitchen OVER THE COUNTER MEDICATION Take by mouth daily. Phosphorous    . sevelamer carbonate (RENVELA) 800 MG tablet Take 800 mg by mouth 3 (three) times daily with meals.    Marland Kitchen Specialty Vitamins Products (MAGNESIUM, AMINO ACID CHELATE,) 133 MG tablet Take 2 tablets by mouth in the morning and at bedtime.    . sulfamethoxazole-trimethoprim (BACTRIM) 400-80 MG tablet Take 1 tablet by mouth 3 (three) times a week. Monday, Wednesday and Friday    . Tacrolimus ER 1 MG TB24 Take 3 mg by mouth daily.    . Tacrolimus ER 4 MG TB24 Take 1 mg by mouth daily.    . valGANciclovir (VALCYTE) 450 MG tablet Take 450 mg by mouth daily.    Marland Kitchen  levETIRAcetam (KEPPRA) 250 MG tablet TAKE 1 TABLET BY MOUTH TWICE DAILY.  YOU NEED TO CALL [phone] AND SCHEDULE FOLLOW UP APPOINTMENT. 180 tablet 2  . clonazePAM (KLONOPIN PO) Take 0.5 mg by mouth in the morning and at bedtime.     No facility-administered medications prior to visit.    PAST MEDICAL HISTORY: Past Medical History:  Diagnosis Date  . Anemia   . Brain injury ([place])   . Cardiac arrest ([place]) 03/26/2011  . Cardiomyopathy   . CHF (congestive heart failure) ([place])   . Chronic kidney disease    [facility]- [place]  . Depression   . Family history of adverse reaction to anesthesia    Mother hard to wake up.  . Gait disorder 01/11/2013  . Heart failure   . History of blood transfusion  11/2011  . Hypertension   . Ileitis   . MPGN (membranoproliferative glomerulonephritis), type 2   . Pneumonia    'walking pneumonia'  . Renal disorder   . Seizures ([place])    2013- last time    PAST SURGICAL HISTORY: Past Surgical History:  Procedure Laterality Date  . AV FISTULA PLACEMENT  12/18/2011   Procedure: ARTERIOVENOUS (AV) FISTULA CREATION;  Surgeon: Angelia Mould, MD;  Location: [place];  Service: Vascular;  Laterality: Left;  . INSERTION OF DIALYSIS CATHETER Right 08/06/2014   Procedure: INSERTION OF DIALYSIS CATHETER;  Surgeon: Elam Dutch, MD;  Location: [place];  Service: Vascular;  Laterality: Right;  . RENAL BIOPSY    . REVISON OF ARTERIOVENOUS FISTULA Left [id number]   Procedure: PLICATION OF ARTERIOVENOUS FISTULA;  Surgeon: Elam Dutch, MD;  Location: [place];  Service: Vascular;  Laterality: Left;  . REVISON OF ARTERIOVENOUS FISTULA Left 03/12/2017   Procedure: REVISION PLICATION OF ARTERIOVENOUS FISTULA LEFT UPPER ARM;  Surgeon: Serafina Mitchell, MD;  Location: [facility];  Service: Vascular;  Laterality: Left;    FAMILY HISTORY: Family History  Problem Relation Age of Onset  . Hypertension Mother   . Diabetes Father   . Hyperlipidemia Father     SOCIAL HISTORY: Social History   Socioeconomic History  . Marital status: Single    Spouse name: Not on file  . Number of children: 0  . Years of education: college jr  . Highest education level: Not on file  Occupational History  . Not on file  Tobacco Use  . Smoking status: Never Smoker  . Smokeless tobacco: Never Used  Substance and Sexual Activity  . Alcohol use: No    Alcohol/week: 0.0 standard drinks  . Drug use: No  . Sexual activity: Never    Birth control/protection: Abstinence  Other Topics Concern  . Not on file  Social History Narrative   Lives at home with her mother & brother   Right handed      ** Merged History Encounter **       Social Determinants of Health   Financial  Resource Strain:   . Difficulty of Paying Living Expenses: Not on file  Food Insecurity:   . Worried About Charity fundraiser in the Last Year: Not on file  . Ran Out of Food in the Last Year: Not on file  Transportation Needs:   . Lack of Transportation (Medical): Not on file  . Lack of Transportation (Non-Medical): Not on file  Physical Activity:   . Days of Exercise per Week: Not on file  . Minutes of Exercise per Session: Not on file  Stress:   .  Feeling of Stress : Not on file  Social Connections:   . Frequency of Communication with Friends and Family: Not on file  . Frequency of Social Gatherings with Friends and Family: Not on file  . Attends Religious Services: Not on file  . Active Member of Clubs or Organizations: Not on file  . Attends Archivist Meetings: Not on file  . Marital Status: Not on file  Intimate Partner Violence:   . Fear of Current or Ex-Partner: Not on file  . Emotionally Abused: Not on file  . Physically Abused: Not on file  . Sexually Abused: Not on file   PHYSICAL EXAM  Vitals:   05/24/19 0819  BP: 100/65  Pulse: 91  Temp: (!) 97.5 F (36.4 C)  TempSrc: Oral  Weight: 139 lb (63 kg)  Height: 5\' 2"  (1.575 m)   Body mass index is 25.42 kg/m.  Generalized: Well developed, in no acute distress  Neurological examination  Mentation: Alert oriented to time, place, history taking. Follows all commands speech and language fluent Cranial nerve II-XII: Pupils were equal round reactive to light. Extraocular movements were full, visual field were full on confrontational test. Facial sensation and strength were normal. Head turning and shoulder shrug were normal and symmetric. Motor: Good strength of all extremities Sensory: Sensory testing is intact to soft touch on all 4 extremities. No evidence of extinction is noted.  Coordination: Cerebellar testing reveals good finger-nose-finger and heel-to-shin bilaterally. Mild intention tremor with  finger-nose-finger Gait and station: Gait is wide-based, unsteady, with a walker, gait is steady. Reflexes: Deep tendon reflexes are symmetric   DIAGNOSTIC DATA (LABS, IMAGING, TESTING) - I reviewed patient records, labs, notes, testing and imaging myself where available.  Lab Results  Component Value Date   WBC 6.6 12/18/2011   HGB 13.3 03/12/2017   HCT 39.0 03/12/2017   MCV 90.9 12/18/2011   PLT 244 12/18/2011      Component Value Date/Time   NA 137 03/12/2017 1024   K 4.8 03/12/2017 1024   CL 99 12/18/2011 0505   CO2 27 12/18/2011 0505   GLUCOSE 80 03/12/2017 1024   BUN 17 12/18/2011 0505   CREATININE 2.30 (H) 12/18/2011 0505   CALCIUM 9.4 12/18/2011 0505   CALCIUM 8.4 10/28/2011 1327   PROT 6.1 11/29/2011 0224   ALBUMIN 2.4 (L) 12/18/2011 0505   AST 56 (H) 11/29/2011 0224   ALT 6 12/01/2011 2330   ALKPHOS 62 11/29/2011 0224   BILITOT 0.4 11/29/2011 0224   GFRNONAA 29 (L) 12/18/2011 0505   GFRAA 34 (L) 12/18/2011 0505   Lab Results  Component Value Date   CHOL 296 (H) 03/26/2011   HDL 54 03/26/2011   LDLCALC 213 (H) 03/26/2011   TRIG 146 03/26/2011   CHOLHDL 5.5 03/26/2011   No results found for: HGBA1C Lab Results  Component Value Date   VITAMINB12 1,354 (H) 11/28/2011   Lab Results  Component Value Date   TSH 7.226 (H) 09/12/2011      ASSESSMENT AND PLAN 29 y.o. year old female  has a past medical history of Anemia, Brain injury ([place]), Cardiac arrest ([place]) (03/26/2011), Cardiomyopathy, CHF (congestive heart failure) ([place]), Chronic kidney disease, Depression, Family history of adverse reaction to anesthesia, Gait disorder (01/11/2013), Heart failure, History of blood transfusion (11/2011), Hypertension, Ileitis, MPGN (membranoproliferative glomerulonephritis), type 2, Pneumonia, Renal disorder, and Seizures ([place]). here with:  1.  Seizures  Since last seen, she has had a kidney transplant.  She has not had  recurrent seizure since 2013 or 2014. It seems her  seizures occurred following 2 episodes of cardiac arrest years ago.  She wonders whether or not she may be able to come off Keppra.  I suppose we may consider this in the future, possibly get EEG before making change, don't see one on file.  For now, she will remain on Keppra 250 mg twice a day.  She will follow-up in 1 year or sooner if needed, call for recurrent seizure.   I spent 15 minutes with the patient. 50% of this time was spent discussing her plan of care.   Butler Denmark, AGNP-C, DNP 05/24/2019, 8:55 AM [facility] [address], [place], [place] [address] [phone]

## 2019-05-25 DIAGNOSIS — D899 Disorder involving the immune mechanism, unspecified: Principal | ICD-10-CM

## 2019-05-25 DIAGNOSIS — Z940 Kidney transplant status: Principal | ICD-10-CM

## 2019-05-25 LAB — RENAL FUNCTION PANEL
BLOOD UREA NITROGEN: 18 mg/dL (ref 6–20)
BUN / CREAT RATIO: 23 (ref 9–23)
CALCIUM: 10.1 mg/dL (ref 8.7–10.2)
CO2: 18 mmol/L — ABNORMAL LOW (ref 20–29)
CREATININE: 0.77 mg/dL (ref 0.57–1.00)
GFR MDRD NON AF AMER: 105 mL/min/{1.73_m2}
GLUCOSE: 86 mg/dL (ref 65–99)
PHOSPHORUS, SERUM: 4.3 mg/dL (ref 3.0–4.3)
POTASSIUM: 5 mmol/L (ref 3.5–5.2)
SODIUM: 138 mmol/L (ref 134–144)

## 2019-05-25 LAB — MEAN CORPUSCULAR HEMOGLOBIN CONC: Erythrocyte mean corpuscular hemoglobin concentration:MCnc:Pt:RBC:Qn:Automated count: 33

## 2019-05-25 LAB — CBC W/ DIFFERENTIAL
BANDED NEUTROPHILS ABSOLUTE COUNT: 0.1 10*3/uL (ref 0.0–0.1)
BASOPHILS RELATIVE PERCENT: 2 %
EOSINOPHILS ABSOLUTE COUNT: 0.1 10*3/uL (ref 0.0–0.4)
EOSINOPHILS RELATIVE PERCENT: 5 %
HEMATOCRIT: 32.7 % — ABNORMAL LOW (ref 34.0–46.6)
HEMOGLOBIN: 10.8 g/dL — ABNORMAL LOW (ref 11.1–15.9)
IMMATURE GRANULOCYTES: 3 %
LYMPHOCYTES ABSOLUTE COUNT: 0.2 10*3/uL — ABNORMAL LOW (ref 0.7–3.1)
LYMPHOCYTES RELATIVE PERCENT: 9 %
MEAN CORPUSCULAR HEMOGLOBIN CONC: 33 g/dL (ref 31.5–35.7)
MEAN CORPUSCULAR HEMOGLOBIN: 32.5 pg (ref 26.6–33.0)
MEAN CORPUSCULAR VOLUME: 99 fL — ABNORMAL HIGH (ref 79–97)
MONOCYTES ABSOLUTE COUNT: 0.2 10*3/uL (ref 0.1–0.9)
MONOCYTES RELATIVE PERCENT: 12 %
NEUTROPHILS ABSOLUTE COUNT: 1.4 10*3/uL (ref 1.4–7.0)
NEUTROPHILS RELATIVE PERCENT: 69 %
RED BLOOD CELL COUNT: 3.32 x10E6/uL — ABNORMAL LOW (ref 3.77–5.28)
RED CELL DISTRIBUTION WIDTH: 11.8 % (ref 11.7–15.4)
WHITE BLOOD CELL COUNT: 2 10*3/uL — CL (ref 3.4–10.8)

## 2019-05-25 LAB — MAGNESIUM: Magnesium:MCnc:Pt:Ser/Plas:Qn:: 1.8

## 2019-05-25 LAB — CHLORIDE: Chloride:SCnc:Pt:Ser/Plas:Qn:: 108 — ABNORMAL HIGH

## 2019-05-27 LAB — CBC W/ DIFFERENTIAL
BANDED NEUTROPHILS ABSOLUTE COUNT: 0.1 10*3/uL (ref 0.0–0.1)
BASOPHILS ABSOLUTE COUNT: 0 10*3/uL (ref 0.0–0.2)
BASOPHILS RELATIVE PERCENT: 2 %
EOSINOPHILS RELATIVE PERCENT: 2 %
HEMATOCRIT: 35 % (ref 34.0–46.6)
HEMOGLOBIN: 11.1 g/dL (ref 11.1–15.9)
IMMATURE GRANULOCYTES: 3 %
LYMPHOCYTES ABSOLUTE COUNT: 0.2 10*3/uL — ABNORMAL LOW (ref 0.7–3.1)
LYMPHOCYTES RELATIVE PERCENT: 7 %
MEAN CORPUSCULAR HEMOGLOBIN CONC: 31.7 g/dL (ref 31.5–35.7)
MEAN CORPUSCULAR HEMOGLOBIN: 31.7 pg (ref 26.6–33.0)
MEAN CORPUSCULAR VOLUME: 100 fL — ABNORMAL HIGH (ref 79–97)
MONOCYTES ABSOLUTE COUNT: 0.3 10*3/uL (ref 0.1–0.9)
MONOCYTES RELATIVE PERCENT: 11 %
NEUTROPHILS RELATIVE PERCENT: 75 %
PLATELET COUNT: 241 10*3/uL (ref 150–450)
RED CELL DISTRIBUTION WIDTH: 11.8 % (ref 11.7–15.4)
WHITE BLOOD CELL COUNT: 2.7 10*3/uL — ABNORMAL LOW (ref 3.4–10.8)

## 2019-05-27 LAB — RENAL FUNCTION PANEL
ALBUMIN: 4.2 g/dL (ref 3.9–5.0)
BLOOD UREA NITROGEN: 24 mg/dL — ABNORMAL HIGH (ref 6–20)
BUN / CREAT RATIO: 31 — ABNORMAL HIGH (ref 9–23)
CALCIUM: 9.8 mg/dL (ref 8.7–10.2)
CHLORIDE: 108 mmol/L — ABNORMAL HIGH (ref 96–106)
CO2: 20 mmol/L (ref 20–29)
CREATININE: 0.77 mg/dL (ref 0.57–1.00)
GFR MDRD AF AMER: 121 mL/min/{1.73_m2}
GFR MDRD NON AF AMER: 105 mL/min/{1.73_m2}
GLUCOSE: 87 mg/dL (ref 65–99)
PHOSPHORUS, SERUM: 3.9 mg/dL (ref 3.0–4.3)
SODIUM: 139 mmol/L (ref 134–144)

## 2019-05-27 LAB — BLOOD UREA NITROGEN: Urea nitrogen:MCnc:Pt:Ser/Plas:Qn:: 24 — ABNORMAL HIGH

## 2019-05-27 LAB — TACROLIMUS BLOOD: Tacrolimus:MCnc:Pt:Bld:Qn:LC/MS/MS: 5.8

## 2019-05-27 LAB — RED CELL DISTRIBUTION WIDTH: Erythrocyte distribution width:Ratio:Pt:RBC:Qn:Automated count: 11.8

## 2019-05-27 LAB — MAGNESIUM: Magnesium:MCnc:Pt:Ser/Plas:Qn:: 1.7

## 2019-05-28 DIAGNOSIS — Z940 Kidney transplant status: Principal | ICD-10-CM

## 2019-05-29 DIAGNOSIS — Z940 Kidney transplant status: Principal | ICD-10-CM

## 2019-05-29 DIAGNOSIS — D899 Disorder involving the immune mechanism, unspecified: Principal | ICD-10-CM

## 2019-05-29 LAB — TACROLIMUS LEVEL: TACROLIMUS BLOOD: 6.2 ng/mL (ref 2.0–20.0)

## 2019-05-29 LAB — TACROLIMUS BLOOD: Tacrolimus:MCnc:Pt:Bld:Qn:LC/MS/MS: 6.2

## 2019-05-29 NOTE — Unmapped (Signed)
[facility] Refill Coordination Note    Specialty Medication(s) to be Shipped:   Transplant: mycophenolate mofetil 180mg   Other medication(s) to be shipped: Sulfa-Trime 400-80mg  & Mag plus Protein 133mg   **Envarsus was last filled 05/16/19, pt has enough.Carolyn Kelley, DOB: [birth year]  Phone: [phone] (home)     All above HIPAA information was verified with patient.     Was a Nurse, learning disability used for this call? No    Completed refill call assessment today to schedule patient's medication shipment from the [facility] [phone]).       Specialty medication(s) and dose(s) confirmed: Regimen is correct and unchanged.   Changes to medications: Areatha reports no changes at this time.  Changes to insurance: No  Questions for the pharmacist: No    Confirmed patient received Welcome Packet with first shipment. The patient will receive a drug information handout for each medication shipped and additional FDA Medication Guides as required.       DISEASE/MEDICATION-SPECIFIC INFORMATION        N/A    SPECIALTY MEDICATION ADHERENCE     Medication Adherence    Patient reported X missed doses in the last month: 0  Specialty Medication: Envarsus XR 1mg   Patient is on additional specialty medications: Yes  Additional Specialty Medications: Envarsus XR 4mg   Patient Reported Additional Medication X Missed Doses in the Last Month: 0  Patient is on more than two specialty medications: Yes  Specialty Medication: Valganciclovir 450mg   Patient Reported Additional Medication X Missed Doses in the Last Month: 0  Specialty Medication: Mycophenolate 180mg   Patient Reported Additional Medication X Missed Doses in the Last Month: 0  Informant: patient  Reliability of informant: reliable        Mycophenolate 180 mg: 7 days of medicine on hand     SHIPPING     Shipping address confirmed in Epic.     Delivery Scheduled: Yes, Expected medication delivery date: 06/01/2019.     Medication will be delivered via UPS to the prescription address in Epic [place].    Carolyn Kelley P [place] [facility] Specialty Technician

## 2019-05-29 NOTE — Unmapped (Signed)
Patient called to ask about sunscreen. Went over sunscreen she should wear and spf. Encouraged her to get facial lotion or makeup with sunscreen for her face so it becomes routine    States she has two dental apts coming up both on Mondays and was not sure how to do her labs those weeks. Told her to go on tues/thursday that week. And that she can go to the lab twice a week now mon/thursday    Went over upcoming apts    Reports she is doing well and denies any other complaints

## 2019-05-30 DIAGNOSIS — D899 Disorder involving the immune mechanism, unspecified: Principal | ICD-10-CM

## 2019-05-30 DIAGNOSIS — Z940 Kidney transplant status: Principal | ICD-10-CM

## 2019-05-30 LAB — RENAL FUNCTION PANEL
BLOOD UREA NITROGEN: 25 mg/dL — ABNORMAL HIGH (ref 6–20)
CALCIUM: 9.8 mg/dL (ref 8.7–10.2)
CHLORIDE: 107 mmol/L — ABNORMAL HIGH (ref 96–106)
CO2: 19 mmol/L — ABNORMAL LOW (ref 20–29)
CREATININE: 0.75 mg/dL (ref 0.57–1.00)
GFR MDRD AF AMER: 124 mL/min/{1.73_m2}
GLUCOSE: 92 mg/dL (ref 65–99)
PHOSPHORUS, SERUM: 4.1 mg/dL (ref 3.0–4.3)
POTASSIUM: 5.1 mmol/L (ref 3.5–5.2)

## 2019-05-30 LAB — MAGNESIUM
MAGNESIUM: 1.7 mg/dL (ref 1.6–2.3)
Magnesium:MCnc:Pt:Ser/Plas:Qn:: 1.7

## 2019-05-30 LAB — PLATELET COUNT: Platelets:NCnc:Pt:Bld:Qn:Automated count: 248

## 2019-05-30 LAB — CBC W/ DIFFERENTIAL
BASOPHILS ABSOLUTE COUNT: 0 10*3/uL (ref 0.0–0.2)
BASOPHILS RELATIVE PERCENT: 0 %
EOSINOPHILS ABSOLUTE COUNT: 0.1 10*3/uL (ref 0.0–0.4)
EOSINOPHILS RELATIVE PERCENT: 2 %
HEMOGLOBIN: 11.4 g/dL (ref 11.1–15.9)
LYMPHOCYTES ABSOLUTE COUNT: 0.2 10*3/uL — ABNORMAL LOW (ref 0.7–3.1)
LYMPHOCYTES RELATIVE PERCENT: 8 %
MEAN CORPUSCULAR HEMOGLOBIN CONC: 33.7 g/dL (ref 31.5–35.7)
MEAN CORPUSCULAR HEMOGLOBIN: 32.9 pg (ref 26.6–33.0)
MEAN CORPUSCULAR VOLUME: 98 fL — ABNORMAL HIGH (ref 79–97)
MONOCYTES ABSOLUTE COUNT: 0.2 10*3/uL (ref 0.1–0.9)
MONOCYTES RELATIVE PERCENT: 7 %
NEUTROPHILS ABSOLUTE COUNT: 2.6 10*3/uL (ref 1.4–7.0)
NEUTROPHILS RELATIVE PERCENT: 83 %
PLATELET COUNT: 248 10*3/uL (ref 150–450)
RED BLOOD CELL COUNT: 3.46 x10E6/uL — ABNORMAL LOW (ref 3.77–5.28)

## 2019-05-30 LAB — GFR MDRD NON AF AMER
Glomerular filtration rate/1.73 sq M.predicted.non black:ArVRat:Pt:Ser/Plas/Bld:Qn:Creatinine-based formula (CKD-EPI): 107

## 2019-05-30 MED FILL — MYCOPHENOLATE SODIUM 180 MG TABLET,DELAYED RELEASE: 30 days supply | Qty: 180 | Fill #2 | Status: AC

## 2019-05-30 MED FILL — MG-PLUS-PROTEIN 133 MG TABLET: ORAL | 30 days supply | Qty: 120 | Fill #2

## 2019-05-30 MED FILL — SULFAMETHOXAZOLE 400 MG-TRIMETHOPRIM 80 MG TABLET: ORAL | 28 days supply | Qty: 12 | Fill #2

## 2019-05-30 MED FILL — MG-PLUS-PROTEIN 133 MG TABLET: 30 days supply | Qty: 120 | Fill #2 | Status: AC

## 2019-05-30 MED FILL — MYCOPHENOLATE SODIUM 180 MG TABLET,DELAYED RELEASE: ORAL | 30 days supply | Qty: 180 | Fill #2

## 2019-05-30 MED FILL — SULFAMETHOXAZOLE 400 MG-TRIMETHOPRIM 80 MG TABLET: 28 days supply | Qty: 12 | Fill #2 | Status: AC

## 2019-05-31 LAB — TACROLIMUS BLOOD: Tacrolimus:MCnc:Pt:Bld:Qn:LC/MS/MS: 7.8

## 2019-06-01 DIAGNOSIS — Z940 Kidney transplant status: Principal | ICD-10-CM

## 2019-06-01 DIAGNOSIS — D899 Disorder involving the immune mechanism, unspecified: Principal | ICD-10-CM

## 2019-06-01 NOTE — Unmapped (Signed)
Patient called to say that she was told they would take her back for labs late today so she got nervous and took her meds at 36 and then had labs at 1109.    Let her know that she has an hour grace window and it was OK if she took her medicines a little later or they drew her labs a little before 11 or after 11.    Patient verbalized understanding

## 2019-06-02 LAB — CBC W/ DIFFERENTIAL
BANDED NEUTROPHILS ABSOLUTE COUNT: 0.1 10*3/uL (ref 0.0–0.1)
BASOPHILS ABSOLUTE COUNT: 0 10*3/uL (ref 0.0–0.2)
BASOPHILS RELATIVE PERCENT: 1 %
EOSINOPHILS ABSOLUTE COUNT: 0.1 10*3/uL (ref 0.0–0.4)
EOSINOPHILS RELATIVE PERCENT: 4 %
HEMATOCRIT: 33.6 % — ABNORMAL LOW (ref 34.0–46.6)
HEMOGLOBIN: 10.9 g/dL — ABNORMAL LOW (ref 11.1–15.9)
IMMATURE GRANULOCYTES: 3 %
LYMPHOCYTES ABSOLUTE COUNT: 0.2 10*3/uL — ABNORMAL LOW (ref 0.7–3.1)
MEAN CORPUSCULAR HEMOGLOBIN CONC: 32.4 g/dL (ref 31.5–35.7)
MEAN CORPUSCULAR HEMOGLOBIN: 32 pg (ref 26.6–33.0)
MEAN CORPUSCULAR VOLUME: 99 fL — ABNORMAL HIGH (ref 79–97)
MONOCYTES ABSOLUTE COUNT: 0.2 10*3/uL (ref 0.1–0.9)
MONOCYTES RELATIVE PERCENT: 9 %
NEUTROPHILS ABSOLUTE COUNT: 2 10*3/uL (ref 1.4–7.0)
NEUTROPHILS RELATIVE PERCENT: 75 %
PLATELET COUNT: 258 10*3/uL (ref 150–450)
RED BLOOD CELL COUNT: 3.41 x10E6/uL — ABNORMAL LOW (ref 3.77–5.28)
RED CELL DISTRIBUTION WIDTH: 11.8 % (ref 11.7–15.4)
WHITE BLOOD CELL COUNT: 2.7 10*3/uL — ABNORMAL LOW (ref 3.4–10.8)

## 2019-06-02 LAB — RENAL FUNCTION PANEL
ALBUMIN: 4.5 g/dL (ref 3.9–5.0)
BLOOD UREA NITROGEN: 12 mg/dL (ref 6–20)
BUN / CREAT RATIO: 16 (ref 9–23)
CALCIUM: 9.9 mg/dL (ref 8.7–10.2)
CHLORIDE: 107 mmol/L — ABNORMAL HIGH (ref 96–106)
CREATININE: 0.76 mg/dL (ref 0.57–1.00)
GFR MDRD AF AMER: 122 mL/min/{1.73_m2}
GLUCOSE: 88 mg/dL (ref 65–99)
PHOSPHORUS, SERUM: 3.6 mg/dL (ref 3.0–4.3)
POTASSIUM: 4.9 mmol/L (ref 3.5–5.2)
SODIUM: 139 mmol/L (ref 134–144)

## 2019-06-02 LAB — MAGNESIUM: Magnesium:MCnc:Pt:Ser/Plas:Qn:: 1.7

## 2019-06-02 LAB — BLOOD UREA NITROGEN: Urea nitrogen:MCnc:Pt:Ser/Plas:Qn:: 12

## 2019-06-02 LAB — MONOCYTES RELATIVE PERCENT: Monocytes/100 leukocytes:NFr:Pt:Bld:Qn:Automated count: 9

## 2019-06-03 LAB — TACROLIMUS BLOOD: Tacrolimus:MCnc:Pt:Bld:Qn:LC/MS/MS: 8.7

## 2019-06-05 DIAGNOSIS — Z940 Kidney transplant status: Principal | ICD-10-CM

## 2019-06-05 DIAGNOSIS — D899 Disorder involving the immune mechanism, unspecified: Principal | ICD-10-CM

## 2019-06-06 DIAGNOSIS — D899 Disorder involving the immune mechanism, unspecified: Principal | ICD-10-CM

## 2019-06-06 DIAGNOSIS — Z940 Kidney transplant status: Principal | ICD-10-CM

## 2019-06-06 LAB — CBC W/ DIFFERENTIAL
BANDED NEUTROPHILS ABSOLUTE COUNT: 0.1 10*3/uL (ref 0.0–0.1)
BASOPHILS ABSOLUTE COUNT: 0.1 10*3/uL (ref 0.0–0.2)
BASOPHILS RELATIVE PERCENT: 2 %
EOSINOPHILS ABSOLUTE COUNT: 0.1 10*3/uL (ref 0.0–0.4)
EOSINOPHILS RELATIVE PERCENT: 4 %
HEMATOCRIT: 32.4 % — ABNORMAL LOW (ref 34.0–46.6)
HEMOGLOBIN: 11.2 g/dL (ref 11.1–15.9)
IMMATURE GRANULOCYTES: 2 %
LYMPHOCYTES ABSOLUTE COUNT: 0.3 10*3/uL — ABNORMAL LOW (ref 0.7–3.1)
LYMPHOCYTES RELATIVE PERCENT: 12 %
MEAN CORPUSCULAR HEMOGLOBIN CONC: 34.6 g/dL (ref 31.5–35.7)
MEAN CORPUSCULAR HEMOGLOBIN: 32.7 pg (ref 26.6–33.0)
MEAN CORPUSCULAR VOLUME: 95 fL (ref 79–97)
MONOCYTES ABSOLUTE COUNT: 0.2 10*3/uL (ref 0.1–0.9)
NEUTROPHILS ABSOLUTE COUNT: 1.9 10*3/uL (ref 1.4–7.0)
NEUTROPHILS RELATIVE PERCENT: 71 %
PLATELET COUNT: 242 10*3/uL (ref 150–450)
RED BLOOD CELL COUNT: 3.42 x10E6/uL — ABNORMAL LOW (ref 3.77–5.28)
WHITE BLOOD CELL COUNT: 2.6 10*3/uL — ABNORMAL LOW (ref 3.4–10.8)

## 2019-06-06 LAB — RENAL FUNCTION PANEL
ALBUMIN: 4.4 g/dL (ref 3.9–5.0)
BLOOD UREA NITROGEN: 19 mg/dL (ref 6–20)
CALCIUM: 9.8 mg/dL (ref 8.7–10.2)
CHLORIDE: 106 mmol/L (ref 96–106)
CO2: 19 mmol/L — ABNORMAL LOW (ref 20–29)
CREATININE: 0.79 mg/dL (ref 0.57–1.00)
GFR MDRD AF AMER: 116 mL/min/{1.73_m2}
GFR MDRD NON AF AMER: 101 mL/min/{1.73_m2}
PHOSPHORUS, SERUM: 4.2 mg/dL (ref 3.0–4.3)
POTASSIUM: 4.9 mmol/L (ref 3.5–5.2)
SODIUM: 137 mmol/L (ref 134–144)

## 2019-06-06 LAB — CREATININE URINE: Creatinine:MCnc:Pt:Urine:Qn:: 81

## 2019-06-06 LAB — ALBUMIN: Albumin:MCnc:Pt:Ser/Plas:Qn:: 4.4

## 2019-06-06 LAB — MAGNESIUM: Magnesium:MCnc:Pt:Ser/Plas:Qn:: 1.8

## 2019-06-06 LAB — PROTEIN / CREATININE RATIO, URINE
PROTEIN URINE: 13.4 mg/dL
PROTEIN/CREAT RATIO: 165 mg/g{creat} (ref 0–200)

## 2019-06-06 LAB — RED CELL DISTRIBUTION WIDTH: Erythrocyte distribution width:Ratio:Pt:RBC:Qn:Automated count: 11.8

## 2019-06-07 LAB — TACROLIMUS BLOOD: Tacrolimus:MCnc:Pt:Bld:Qn:LC/MS/MS: 6.3

## 2019-06-08 DIAGNOSIS — Z940 Kidney transplant status: Principal | ICD-10-CM

## 2019-06-08 DIAGNOSIS — D899 Disorder involving the immune mechanism, unspecified: Principal | ICD-10-CM

## 2019-06-08 NOTE — Unmapped (Signed)
Pt's mother called this AM and left VM requesting call back as she had nutrition questions.   Called mother back, and she did not answer. Left VM for mother with free times to call back.     Lanelle Bal, RD, LDN  Abdominal Transplant Dietitian   Pager: [phone]

## 2019-06-09 LAB — RENAL FUNCTION PANEL
ALBUMIN: 4.4 g/dL (ref 3.9–5.0)
BUN / CREAT RATIO: 30 — ABNORMAL HIGH (ref 9–23)
CALCIUM: 10.1 mg/dL (ref 8.7–10.2)
CHLORIDE: 106 mmol/L (ref 96–106)
CO2: 18 mmol/L — ABNORMAL LOW (ref 20–29)
CREATININE: 0.71 mg/dL (ref 0.57–1.00)
GFR MDRD AF AMER: 132 mL/min/{1.73_m2}
GFR MDRD NON AF AMER: 115 mL/min/{1.73_m2}
GLUCOSE: 84 mg/dL (ref 65–99)
PHOSPHORUS, SERUM: 4.1 mg/dL (ref 3.0–4.3)
POTASSIUM: 4.8 mmol/L (ref 3.5–5.2)

## 2019-06-09 LAB — CBC W/ DIFFERENTIAL
BASOPHILS ABSOLUTE COUNT: 0.1 10*3/uL (ref 0.0–0.2)
BASOPHILS RELATIVE PERCENT: 2 %
EOSINOPHILS RELATIVE PERCENT: 7 %
HEMATOCRIT: 35.3 % (ref 34.0–46.6)
HEMOGLOBIN: 11.6 g/dL (ref 11.1–15.9)
LYMPHOCYTES ABSOLUTE COUNT: 0.1 10*3/uL — ABNORMAL LOW (ref 0.7–3.1)
LYMPHOCYTES RELATIVE PERCENT: 4 %
MEAN CORPUSCULAR HEMOGLOBIN CONC: 32.9 g/dL (ref 31.5–35.7)
MEAN CORPUSCULAR HEMOGLOBIN: 32 pg (ref 26.6–33.0)
MEAN CORPUSCULAR VOLUME: 97 fL (ref 79–97)
MONOCYTES ABSOLUTE COUNT: 0.2 10*3/uL (ref 0.1–0.9)
MONOCYTES RELATIVE PERCENT: 7 %
NEUTROPHILS ABSOLUTE COUNT: 2.1 10*3/uL (ref 1.4–7.0)
NEUTROPHILS RELATIVE PERCENT: 80 %
PLATELET COUNT: 260 10*3/uL (ref 150–450)
RED BLOOD CELL COUNT: 3.63 x10E6/uL — ABNORMAL LOW (ref 3.77–5.28)
RED CELL DISTRIBUTION WIDTH: 12.3 % (ref 11.7–15.4)
WHITE BLOOD CELL COUNT: 2.6 10*3/uL — ABNORMAL LOW (ref 3.4–10.8)

## 2019-06-09 LAB — NEUTROPHILS ABSOLUTE COUNT: Neutrophils:NCnc:Pt:Bld:Qn:Automated count: 2.1

## 2019-06-09 LAB — CO2: Carbon dioxide:SCnc:Pt:Ser/Plas:Qn:: 18 — ABNORMAL LOW

## 2019-06-09 LAB — MAGNESIUM: Magnesium:MCnc:Pt:Ser/Plas:Qn:: 1.9

## 2019-06-09 NOTE — Unmapped (Signed)
Mother called and requested information on whether it is safe for pt to eat food from a drive thru once she is 3 months out from transplant. Discussed food safety precaution guidelines with mother, and recommended ability to liberalize restrictions at this time given appropriate ANC levels. Pt and mother grateful for information.     Lanelle Bal, RD, LDN  Abdominal Transplant Dietitian   Pager: [phone]

## 2019-06-12 DIAGNOSIS — Z940 Kidney transplant status: Principal | ICD-10-CM

## 2019-06-12 DIAGNOSIS — D899 Disorder involving the immune mechanism, unspecified: Principal | ICD-10-CM

## 2019-06-12 LAB — TACROLIMUS BLOOD: Tacrolimus:MCnc:Pt:Bld:Qn:LC/MS/MS: 6.4

## 2019-06-12 MED ORDER — TACROLIMUS XR 1 MG TABLET,EXTENDED RELEASE 24 HR
ORAL_TABLET | Freq: Every day | ORAL | 11 refills | 30.00000 days
Start: 2019-06-12 — End: 2020-06-11

## 2019-06-12 MED ORDER — TACROLIMUS XR 4 MG TABLET,EXTENDED RELEASE 24 HR
ORAL_TABLET | Freq: Every day | ORAL | 11 refills | 30.00000 days | Status: CP
Start: 2019-06-12 — End: 2020-06-11
  Filled 2019-06-14: qty 60, 30d supply, fill #0

## 2019-06-12 NOTE — Unmapped (Signed)
Spoke to patient and she verified she got my VM about increasing envarsus to 8mg .  She will call [facility] when she needs more medication.    Denies any other needs

## 2019-06-12 NOTE — Unmapped (Signed)
Per Carolyn Kelley increase envarsus by 1mg     Called and left patient detailed VM to increase her envarsus to 8mg  daily due to a low tac level. Asked patient to call back or send mychart message that she got the dose change

## 2019-06-12 NOTE — Unmapped (Addendum)
3/22: speaking with pt today re: envarsus dose change [name]    [facility] Clinical Assessment & Refill Coordination Note    Carolyn Kelley, DOB: April 08, 1990  Phone: [phone] (home)     All above HIPAA information was verified with patient.     Was a Nurse, learning disability used for this call? No    Specialty Medication(s):   Transplant: Envarsus 1mg , Envarsus 4mg ,  mycophenolic acid 180mg  and valgancyclovir 450mg      Current Outpatient Medications   Medication Sig Dispense Refill   ??? chlorhexidine (PERIDEX) 0.12 % solution 15 mL by Mouth route Two (2) times a day.     ??? acetaminophen (TYLENOL) 500 MG tablet Take 1-2 tablets (500-1,000 mg total) by mouth every six (6) hours as needed for pain or fever (> 38C). 100 tablet 0   ??? aspirin (ECOTRIN) 81 MG tablet Take 1 tablet (81 mg total) by mouth daily. 30 tablet 11   ??? clonazePAM (KLONOPIN) 0.5 MG tablet Take 1 tablet (0.5 mg total) by mouth Two (2) times a day. 60 tablet 5   ??? docusate sodium (COLACE) 100 MG capsule Take 1 capsule (100 mg total) by mouth two (2) times a day as needed for constipation. 60 capsule 0   ??? ergocalciferol (DRISDOL) 1,250 mcg (50,000 unit) capsule Take 1 capsule (50,000 Units total) by mouth once a week. 4 capsule 2   ??? famotidine (PEPCID) 20 MG tablet Take 1 tablet (20 mg total) by mouth daily as needed for heartburn. 30 tablet 11   ??? levETIRAcetam (KEPPRA) 250 MG tablet Take 1 tablet (250 mg total) by mouth Two (2) times a day. 180 tablet 3   ??? levETIRAcetam (KEPPRA) 250 MG tablet TAKE 1 TABLET BY MOUTH TWICE DAILY. 180 tablet 3   ??? magnesium oxide-Mg AA chelate (MAGNESIUM, AMINO ACID CHELATE,) 133 mg Tab Take 2 tablets by mouth Two (2) times a day. 120 tablet 11   ??? mycophenolate (MYFORTIC) 180 MG EC tablet Take 3 tablets (540 mg total) by mouth Two (2) times a day. Adjust dose per medication card. 180 tablet 11   ??? polyethylene glycol (MIRALAX) 17 gram packet Mix 1 packet (17 g) in 8oz of water, tea, coffee, or juice and drink by mouth daily as needed (constipation). 30 each 0   ??? sulfamethoxazole-trimethoprim (BACTRIM) 400-80 mg per tablet Take 1 tablet (80 mg of trimethoprim total) by mouth Every Monday, Wednesday, and Friday. 12 tablet 5   ??? tacrolimus (ENVARSUS XR) 1 mg Tb24 extended release tablet Take 3 tablets (3 mg total) by mouth daily with 1 (4 mg) tablet for total dose of 7 mg daily 90 tablet 11   ??? tacrolimus (ENVARSUS XR) 4 mg Tb24 extended release tablet Take 2 tablets (8 mg total) by mouth daily. 60 tablet 11   ??? traMADoL (ULTRAM) 50 mg tablet Take 1-2 tablets (50-100 mg total) by mouth every twelve (12) hours as needed for pain. 30 tablet 0     No current facility-administered medications for this visit.         Changes to medications: envarsus now 8mg  daily, valcyte has been stopped    Allergies   Allergen Reactions   ??? Bee Pollen Swelling     Sneezing, congestion  denies swelling.  Sneezing, congestion  denies swelling.     ??? Pollen Extracts Swelling       Changes to allergies: No    SPECIALTY MEDICATION ADHERENCE     Mycophenolate 180mg   :  21 days of medicine on hand   envarsus 1mg   : 10 days of medicine on hand - no longer taking  envarsus 4mg   : 5 days of medicine on hand   valganciclovir 450mg   : 0 days of medicine on hand - no longer taking      Medication Adherence    Patient reported X missed doses in the last month: 0  Specialty Medication: envarsus 1mg   Patient is on additional specialty medications: Yes  Additional Specialty Medications: envarsus 4mg   Patient Reported Additional Medication X Missed Doses in the Last Month: 0  Patient is on more than two specialty medications: Yes  Specialty Medication: valganciclovir 450mg   Patient Reported Additional Medication X Missed Doses in the Last Month: 0  Specialty Medication: mycophenolate 180mg   Patient Reported Additional Medication X Missed Doses in the Last Month: 0          Specialty medication(s) dose(s) confirmed: Patient reports changes to the regimen as follows: envarsus is now 8mg  daily and valcyte has stopped     Are there any concerns with adherence? No    Adherence counseling provided? Not needed    CLINICAL MANAGEMENT AND INTERVENTION      Clinical Benefit Assessment:    Do you feel the medicine is effective or helping your condition? Yes    Clinical Benefit counseling provided? Not needed    Adverse Effects Assessment:    Are you experiencing any side effects? No    Are you experiencing difficulty administering your medicine? No    Quality of Life Assessment:    How many days over the past month did your transplant  keep you from your normal activities? For example, brushing your teeth or getting up in the morning. 0    Have you discussed this with your provider? Not needed    Therapy Appropriateness:    Is therapy appropriate? Yes, therapy is appropriate and should be continued    DISEASE/MEDICATION-SPECIFIC INFORMATION      N/A    PATIENT SPECIFIC NEEDS     ? Does the patient have any physical, cognitive, or cultural barriers? No    ? Is the patient high risk? Yes, patient is taking a REMS drug. Medication is dispensed in compliance with REMS program.     ? Does the patient require a Care Management Plan? No     ? Does the patient require physician intervention or other additional services (i.e. nutrition, smoking cessation, social work)? No      SHIPPING     Specialty Medication(s) to be Shipped:   Transplant: Envarsus 4mg     Other medication(s) to be shipped: clonazepam  Pt wants call back next week for all other meds     Changes to insurance: No    Delivery Scheduled: Yes, Expected medication delivery date: 06/15/2019.     Medication will be delivered via UPS to the confirmed prescription address in [facility].    The patient will receive a drug information handout for each medication shipped and additional FDA Medication Guides as required.  Verified that patient has previously received a Conservation officer, historic buildings.    All of the patient's questions and concerns have been addressed.    Thad Ranger   [facility] Specialty Pharmacist

## 2019-06-13 DIAGNOSIS — D899 Disorder involving the immune mechanism, unspecified: Principal | ICD-10-CM

## 2019-06-13 DIAGNOSIS — Z940 Kidney transplant status: Principal | ICD-10-CM

## 2019-06-14 LAB — CBC W/ DIFFERENTIAL
BANDED NEUTROPHILS ABSOLUTE COUNT: 0.1 10*3/uL (ref 0.0–0.1)
BASOPHILS RELATIVE PERCENT: 2 %
EOSINOPHILS ABSOLUTE COUNT: 0.1 10*3/uL (ref 0.0–0.4)
EOSINOPHILS RELATIVE PERCENT: 5 %
HEMOGLOBIN: 11.5 g/dL (ref 11.1–15.9)
IMMATURE GRANULOCYTES: 5 %
LYMPHOCYTES ABSOLUTE COUNT: 0.2 10*3/uL — ABNORMAL LOW (ref 0.7–3.1)
MEAN CORPUSCULAR HEMOGLOBIN CONC: 32.4 g/dL (ref 31.5–35.7)
MEAN CORPUSCULAR VOLUME: 97 fL (ref 79–97)
MONOCYTES ABSOLUTE COUNT: 0.2 10*3/uL (ref 0.1–0.9)
MONOCYTES RELATIVE PERCENT: 10 %
NEUTROPHILS ABSOLUTE COUNT: 1.4 10*3/uL (ref 1.4–7.0)
NEUTROPHILS RELATIVE PERCENT: 69 %
PLATELET COUNT: 250 10*3/uL (ref 150–450)
RED BLOOD CELL COUNT: 3.65 x10E6/uL — ABNORMAL LOW (ref 3.77–5.28)
RED CELL DISTRIBUTION WIDTH: 11.6 % — ABNORMAL LOW (ref 11.7–15.4)
WHITE BLOOD CELL COUNT: 2 10*3/uL — CL (ref 3.4–10.8)

## 2019-06-14 LAB — RENAL FUNCTION PANEL
ALBUMIN: 4.2 g/dL (ref 3.9–5.0)
BLOOD UREA NITROGEN: 18 mg/dL (ref 6–20)
BUN / CREAT RATIO: 22 (ref 9–23)
CALCIUM: 9.9 mg/dL (ref 8.7–10.2)
CHLORIDE: 105 mmol/L (ref 96–106)
CREATININE: 0.83 mg/dL (ref 0.57–1.00)
GFR MDRD AF AMER: 109 mL/min/{1.73_m2}
GFR MDRD NON AF AMER: 95 mL/min/{1.73_m2}
GLUCOSE: 91 mg/dL (ref 65–99)
PHOSPHORUS, SERUM: 4.1 mg/dL (ref 3.0–4.3)
POTASSIUM: 5 mmol/L (ref 3.5–5.2)
SODIUM: 136 mmol/L (ref 134–144)

## 2019-06-14 LAB — GFR MDRD NON AF AMER
Glomerular filtration rate/1.73 sq M.predicted.non black:ArVRat:Pt:Ser/Plas/Bld:Qn:Creatinine-based formula (CKD-EPI): 95

## 2019-06-14 LAB — HEMATOCRIT: Hematocrit:VFr:Pt:Bld:Qn:Automated count: 35.5

## 2019-06-14 LAB — MAGNESIUM: Magnesium:MCnc:Pt:Ser/Plas:Qn:: 1.7

## 2019-06-14 MED FILL — CLONAZEPAM 0.5 MG TABLET: 30 days supply | Qty: 60 | Fill #2 | Status: AC

## 2019-06-14 MED FILL — ENVARSUS XR 4 MG TABLET,EXTENDED RELEASE: 30 days supply | Qty: 60 | Fill #0 | Status: AC

## 2019-06-14 MED FILL — CLONAZEPAM 0.5 MG TABLET: ORAL | 30 days supply | Qty: 60 | Fill #2

## 2019-06-15 DIAGNOSIS — Z940 Kidney transplant status: Principal | ICD-10-CM

## 2019-06-15 DIAGNOSIS — D899 Disorder involving the immune mechanism, unspecified: Principal | ICD-10-CM

## 2019-06-15 LAB — TACROLIMUS BLOOD: Tacrolimus:MCnc:Pt:Bld:Qn:LC/MS/MS: 7.7

## 2019-06-15 NOTE — Unmapped (Signed)
Called patient to ask about vit D. Let her know she should finish prescription and that we would recheck her level when she comes to clinic.    Patient verbalized understanding.    Denies any other needs.

## 2019-06-16 LAB — RENAL FUNCTION PANEL
BUN / CREAT RATIO: 22 (ref 9–23)
CALCIUM: 9.9 mg/dL (ref 8.7–10.2)
CHLORIDE: 105 mmol/L (ref 96–106)
CO2: 18 mmol/L — ABNORMAL LOW (ref 20–29)
CREATININE: 0.85 mg/dL (ref 0.57–1.00)
GFR MDRD AF AMER: 106 mL/min/{1.73_m2}
GFR MDRD NON AF AMER: 92 mL/min/{1.73_m2}
PHOSPHORUS, SERUM: 4 mg/dL (ref 3.0–4.3)
SODIUM: 138 mmol/L (ref 134–144)

## 2019-06-16 LAB — MONOCYTES RELATIVE PERCENT: Monocytes/100 leukocytes:NFr:Pt:Bld:Qn:Automated count: 15

## 2019-06-16 LAB — CBC W/ DIFFERENTIAL
BANDED NEUTROPHILS ABSOLUTE COUNT: 0.1 10*3/uL (ref 0.0–0.1)
BASOPHILS ABSOLUTE COUNT: 0.1 10*3/uL (ref 0.0–0.2)
BASOPHILS RELATIVE PERCENT: 2 %
EOSINOPHILS ABSOLUTE COUNT: 0.1 10*3/uL (ref 0.0–0.4)
EOSINOPHILS RELATIVE PERCENT: 5 %
HEMOGLOBIN: 11.3 g/dL (ref 11.1–15.9)
IMMATURE GRANULOCYTES: 5 %
LYMPHOCYTES ABSOLUTE COUNT: 0.2 10*3/uL — ABNORMAL LOW (ref 0.7–3.1)
LYMPHOCYTES RELATIVE PERCENT: 10 %
MEAN CORPUSCULAR HEMOGLOBIN CONC: 32.8 g/dL (ref 31.5–35.7)
MEAN CORPUSCULAR HEMOGLOBIN: 31.6 pg (ref 26.6–33.0)
MONOCYTES ABSOLUTE COUNT: 0.3 10*3/uL (ref 0.1–0.9)
MONOCYTES RELATIVE PERCENT: 15 %
NEUTROPHILS ABSOLUTE COUNT: 1.4 10*3/uL (ref 1.4–7.0)
NEUTROPHILS RELATIVE PERCENT: 63 %
PLATELET COUNT: 245 10*3/uL (ref 150–450)
RED BLOOD CELL COUNT: 3.58 x10E6/uL — ABNORMAL LOW (ref 3.77–5.28)
WHITE BLOOD CELL COUNT: 2.2 10*3/uL — CL (ref 3.4–10.8)

## 2019-06-16 LAB — CHLORIDE: Chloride:SCnc:Pt:Ser/Plas:Qn:: 105

## 2019-06-16 LAB — MAGNESIUM: Magnesium:MCnc:Pt:Ser/Plas:Qn:: 1.6

## 2019-06-18 LAB — TACROLIMUS BLOOD: Tacrolimus:MCnc:Pt:Bld:Qn:LC/MS/MS: 8.7

## 2019-06-19 DIAGNOSIS — D899 Disorder involving the immune mechanism, unspecified: Principal | ICD-10-CM

## 2019-06-19 DIAGNOSIS — Z940 Kidney transplant status: Principal | ICD-10-CM

## 2019-06-20 DIAGNOSIS — D899 Disorder involving the immune mechanism, unspecified: Principal | ICD-10-CM

## 2019-06-20 DIAGNOSIS — Z940 Kidney transplant status: Principal | ICD-10-CM

## 2019-06-21 LAB — RENAL FUNCTION PANEL
BLOOD UREA NITROGEN: 16 mg/dL (ref 6–20)
BUN / CREAT RATIO: 19 (ref 9–23)
CALCIUM: 9.6 mg/dL (ref 8.7–10.2)
CHLORIDE: 107 mmol/L — ABNORMAL HIGH (ref 96–106)
CO2: 20 mmol/L (ref 20–29)
CREATININE: 0.84 mg/dL (ref 0.57–1.00)
GFR MDRD AF AMER: 108 mL/min/{1.73_m2}
GFR MDRD NON AF AMER: 94 mL/min/{1.73_m2}
GLUCOSE: 88 mg/dL (ref 65–99)
POTASSIUM: 4.8 mmol/L (ref 3.5–5.2)
SODIUM: 136 mmol/L (ref 134–144)

## 2019-06-21 LAB — HEMATOCRIT: Hematocrit:VFr:Pt:Bld:Qn:Automated count: 34

## 2019-06-21 LAB — CBC W/ DIFFERENTIAL
BANDED NEUTROPHILS ABSOLUTE COUNT: 0.1 10*3/uL (ref 0.0–0.1)
BASOPHILS ABSOLUTE COUNT: 0 10*3/uL (ref 0.0–0.2)
BASOPHILS RELATIVE PERCENT: 2 %
EOSINOPHILS ABSOLUTE COUNT: 0.1 10*3/uL (ref 0.0–0.4)
EOSINOPHILS RELATIVE PERCENT: 5 %
HEMOGLOBIN: 11.3 g/dL (ref 11.1–15.9)
IMMATURE GRANULOCYTES: 4 %
LYMPHOCYTES ABSOLUTE COUNT: 0.2 10*3/uL — ABNORMAL LOW (ref 0.7–3.1)
LYMPHOCYTES RELATIVE PERCENT: 10 %
MEAN CORPUSCULAR HEMOGLOBIN CONC: 33.2 g/dL (ref 31.5–35.7)
MEAN CORPUSCULAR HEMOGLOBIN: 31.6 pg (ref 26.6–33.0)
MEAN CORPUSCULAR VOLUME: 95 fL (ref 79–97)
MONOCYTES ABSOLUTE COUNT: 0.3 10*3/uL (ref 0.1–0.9)
MONOCYTES RELATIVE PERCENT: 14 %
NEUTROPHILS RELATIVE PERCENT: 65 %
PLATELET COUNT: 235 10*3/uL (ref 150–450)
RED BLOOD CELL COUNT: 3.58 x10E6/uL — ABNORMAL LOW (ref 3.77–5.28)
RED CELL DISTRIBUTION WIDTH: 11.7 % (ref 11.7–15.4)
WHITE BLOOD CELL COUNT: 2.3 10*3/uL — CL (ref 3.4–10.8)

## 2019-06-21 LAB — BLOOD UREA NITROGEN: Urea nitrogen:MCnc:Pt:Ser/Plas:Qn:: 16

## 2019-06-21 LAB — MAGNESIUM
MAGNESIUM: 1.7 mg/dL (ref 1.6–2.3)
Magnesium:MCnc:Pt:Ser/Plas:Qn:: 1.7

## 2019-06-22 DIAGNOSIS — D899 Disorder involving the immune mechanism, unspecified: Principal | ICD-10-CM

## 2019-06-22 DIAGNOSIS — Z940 Kidney transplant status: Principal | ICD-10-CM

## 2019-06-22 LAB — TACROLIMUS BLOOD: Tacrolimus:MCnc:Pt:Bld:Qn:LC/MS/MS: 4.2

## 2019-06-22 NOTE — Unmapped (Signed)
[facility] Refill Coordination Note    Specialty Medication(s) to be Shipped:   Transplant: mycophenolate mofetil 180mg     Other medication(s) to be shipped: Vitamin D     Carolyn Kelley, DOB: [birth year]  Phone: [phone] (home)       All above HIPAA information was verified with patient.     Was a Nurse, learning disability used for this call? No    Completed refill call assessment today to schedule patient's medication shipment from the [facility] [phone]).       Specialty medication(s) and dose(s) confirmed: Regimen is correct and unchanged.   Changes to medications: Carolyn Kelley Reports stopping the following medications: Valganciclovir 450  Changes to insurance: No  Questions for the pharmacist: No    Confirmed patient received Welcome Packet with first shipment. The patient will receive a drug information handout for each medication shipped and additional FDA Medication Guides as required.       DISEASE/MEDICATION-SPECIFIC INFORMATION        N/A    SPECIALTY MEDICATION ADHERENCE     Medication Adherence    Patient reported X missed doses in the last month: 0  Specialty Medication: Mycophenolate 180mg   Patient is on additional specialty medications: No  Informant: patient                Mycophenolate 180 mg: 14 days of medicine on hand       SHIPPING     Shipping address confirmed in Epic.     Delivery Scheduled: Yes, Expected medication delivery date: 07/05/19.     Medication will be delivered via UPS to the prescription address in Epic [place].    Carolyn Kelley   [facility] Specialty Technician

## 2019-06-23 DIAGNOSIS — Z940 Kidney transplant status: Principal | ICD-10-CM

## 2019-06-23 LAB — RENAL FUNCTION PANEL
BLOOD UREA NITROGEN: 14 mg/dL (ref 6–20)
CALCIUM: 10 mg/dL (ref 8.7–10.2)
CHLORIDE: 106 mmol/L (ref 96–106)
CO2: 22 mmol/L (ref 20–29)
GFR MDRD AF AMER: 105 mL/min/{1.73_m2}
GFR MDRD NON AF AMER: 91 mL/min/{1.73_m2}
GLUCOSE: 91 mg/dL (ref 65–99)
PHOSPHORUS, SERUM: 3.9 mg/dL (ref 3.0–4.3)
POTASSIUM: 5 mmol/L (ref 3.5–5.2)
SODIUM: 138 mmol/L (ref 134–144)

## 2019-06-23 LAB — CBC W/ DIFFERENTIAL
BANDED NEUTROPHILS ABSOLUTE COUNT: 0.1 10*3/uL (ref 0.0–0.1)
BASOPHILS ABSOLUTE COUNT: 0.1 10*3/uL (ref 0.0–0.2)
BASOPHILS RELATIVE PERCENT: 2 %
EOSINOPHILS ABSOLUTE COUNT: 0.1 10*3/uL (ref 0.0–0.4)
EOSINOPHILS RELATIVE PERCENT: 6 %
HEMATOCRIT: 33.7 % — ABNORMAL LOW (ref 34.0–46.6)
HEMOGLOBIN: 11.2 g/dL (ref 11.1–15.9)
IMMATURE GRANULOCYTES: 4 %
LYMPHOCYTES ABSOLUTE COUNT: 0.3 10*3/uL — ABNORMAL LOW (ref 0.7–3.1)
LYMPHOCYTES RELATIVE PERCENT: 12 %
MEAN CORPUSCULAR HEMOGLOBIN CONC: 33.2 g/dL (ref 31.5–35.7)
MEAN CORPUSCULAR VOLUME: 96 fL (ref 79–97)
MONOCYTES ABSOLUTE COUNT: 0.3 10*3/uL (ref 0.1–0.9)
MONOCYTES RELATIVE PERCENT: 13 %
NEUTROPHILS ABSOLUTE COUNT: 1.4 10*3/uL (ref 1.4–7.0)
NEUTROPHILS RELATIVE PERCENT: 63 %
PLATELET COUNT: 227 10*3/uL (ref 150–450)
RED CELL DISTRIBUTION WIDTH: 11.8 % (ref 11.7–15.4)
WHITE BLOOD CELL COUNT: 2.3 10*3/uL — CL (ref 3.4–10.8)

## 2019-06-23 LAB — CALCIUM: Calcium:MCnc:Pt:Ser/Plas:Qn:: 10

## 2019-06-23 LAB — MAGNESIUM: Magnesium:MCnc:Pt:Ser/Plas:Qn:: 1.7

## 2019-06-23 LAB — RED BLOOD CELL COUNT: Erythrocytes:NCnc:Pt:Bld:Qn:Automated count: 3.51 — ABNORMAL LOW

## 2019-06-25 LAB — TACROLIMUS BLOOD: Tacrolimus:MCnc:Pt:Bld:Qn:LC/MS/MS: 8.5

## 2019-06-26 DIAGNOSIS — D899 Disorder involving the immune mechanism, unspecified: Principal | ICD-10-CM

## 2019-06-26 DIAGNOSIS — Z940 Kidney transplant status: Principal | ICD-10-CM

## 2019-06-27 DIAGNOSIS — D899 Disorder involving the immune mechanism, unspecified: Principal | ICD-10-CM

## 2019-06-27 DIAGNOSIS — Z940 Kidney transplant status: Principal | ICD-10-CM

## 2019-06-27 LAB — CBC W/ DIFFERENTIAL
BASOPHILS ABSOLUTE COUNT: 0.1 10*3/uL (ref 0.0–0.2)
EOSINOPHILS RELATIVE PERCENT: 6 %
HEMATOCRIT: 33.1 % — ABNORMAL LOW (ref 34.0–46.6)
HEMOGLOBIN: 11.1 g/dL (ref 11.1–15.9)
LYMPHOCYTES ABSOLUTE COUNT: 0.1 10*3/uL — ABNORMAL LOW (ref 0.7–3.1)
LYMPHOCYTES RELATIVE PERCENT: 4 %
MEAN CORPUSCULAR HEMOGLOBIN CONC: 33.5 g/dL (ref 31.5–35.7)
MEAN CORPUSCULAR HEMOGLOBIN: 31.6 pg (ref 26.6–33.0)
MEAN CORPUSCULAR VOLUME: 94 fL (ref 79–97)
MONOCYTES ABSOLUTE COUNT: 0.3 10*3/uL (ref 0.1–0.9)
MONOCYTES RELATIVE PERCENT: 10 %
NEUTROPHILS ABSOLUTE COUNT: 2.1 10*3/uL (ref 1.4–7.0)
NEUTROPHILS RELATIVE PERCENT: 77 %
PLATELET COUNT: 232 10*3/uL (ref 150–450)
RED BLOOD CELL COUNT: 3.51 x10E6/uL — ABNORMAL LOW (ref 3.77–5.28)
RED CELL DISTRIBUTION WIDTH: 11.8 % (ref 11.7–15.4)
WHITE BLOOD CELL COUNT: 2.7 10*3/uL — ABNORMAL LOW (ref 3.4–10.8)

## 2019-06-27 LAB — RENAL FUNCTION PANEL
ALBUMIN: 4.4 g/dL (ref 3.9–5.0)
BLOOD UREA NITROGEN: 19 mg/dL (ref 6–20)
BUN / CREAT RATIO: 26 — ABNORMAL HIGH (ref 9–23)
CALCIUM: 9.7 mg/dL (ref 8.7–10.2)
CHLORIDE: 106 mmol/L (ref 96–106)
CO2: 17 mmol/L — ABNORMAL LOW (ref 20–29)
CREATININE: 0.73 mg/dL (ref 0.57–1.00)
GFR MDRD NON AF AMER: 111 mL/min/{1.73_m2}
GLUCOSE: 89 mg/dL (ref 65–99)
POTASSIUM: 4.8 mmol/L (ref 3.5–5.2)
SODIUM: 136 mmol/L (ref 134–144)

## 2019-06-27 LAB — GFR MDRD NON AF AMER
Glomerular filtration rate/1.73 sq M.predicted.non black:ArVRat:Pt:Ser/Plas/Bld:Qn:Creatinine-based formula (CKD-EPI): 111

## 2019-06-27 LAB — MAGNESIUM: Magnesium:MCnc:Pt:Ser/Plas:Qn:: 1.7

## 2019-06-27 LAB — HEMATOLOGY COMMENTS:

## 2019-06-27 MED FILL — SULFAMETHOXAZOLE 400 MG-TRIMETHOPRIM 80 MG TABLET: 28 days supply | Qty: 12 | Fill #3 | Status: AC

## 2019-06-27 MED FILL — LEVETIRACETAM 250 MG TABLET: ORAL | 90 days supply | Qty: 180 | Fill #0

## 2019-06-27 MED FILL — ASPIRIN 81 MG TABLET,DELAYED RELEASE: 90 days supply | Qty: 90 | Fill #1 | Status: AC

## 2019-06-27 MED FILL — ASPIRIN 81 MG TABLET,DELAYED RELEASE: ORAL | 90 days supply | Qty: 90 | Fill #1

## 2019-06-27 MED FILL — LEVETIRACETAM 250 MG TABLET: 90 days supply | Qty: 180 | Fill #0 | Status: AC

## 2019-06-27 MED FILL — SULFAMETHOXAZOLE 400 MG-TRIMETHOPRIM 80 MG TABLET: ORAL | 28 days supply | Qty: 12 | Fill #3

## 2019-06-28 LAB — TACROLIMUS BLOOD: Tacrolimus:MCnc:Pt:Bld:Qn:LC/MS/MS: 6

## 2019-06-28 LAB — TACROLIMUS LEVEL: TACROLIMUS BLOOD: 6 ng/mL (ref 2.0–20.0)

## 2019-06-28 NOTE — Unmapped (Signed)
In reviewing recent labs and tacrolimus trough with Carolyn Kelley, and [name], CPP, no changes at this time and will monitor. Patient recently in the 6-8 tac goal range.   Patient has upcoming appt on Friday.

## 2019-06-29 DIAGNOSIS — Z940 Kidney transplant status: Principal | ICD-10-CM

## 2019-06-29 DIAGNOSIS — D899 Disorder involving the immune mechanism, unspecified: Principal | ICD-10-CM

## 2019-06-30 ENCOUNTER — Ambulatory Visit
Admit: 2019-06-30 | Discharge: 2019-06-30 | Payer: PRIVATE HEALTH INSURANCE | Attending: Nephrology | Primary: Nephrology

## 2019-06-30 ENCOUNTER — Encounter: Admit: 2019-06-30 | Discharge: 2019-06-30 | Payer: PRIVATE HEALTH INSURANCE

## 2019-06-30 DIAGNOSIS — G931 Anoxic brain damage, not elsewhere classified: Principal | ICD-10-CM

## 2019-06-30 DIAGNOSIS — Z940 Kidney transplant status: Principal | ICD-10-CM

## 2019-06-30 DIAGNOSIS — G253 Myoclonus: Principal | ICD-10-CM

## 2019-06-30 DIAGNOSIS — N056 Unspecified nephritic syndrome with dense deposit disease: Principal | ICD-10-CM

## 2019-06-30 DIAGNOSIS — R269 Unspecified abnormalities of gait and mobility: Principal | ICD-10-CM

## 2019-06-30 DIAGNOSIS — D899 Disorder involving the immune mechanism, unspecified: Principal | ICD-10-CM

## 2019-06-30 DIAGNOSIS — R569 Unspecified convulsions: Principal | ICD-10-CM

## 2019-06-30 LAB — CBC W/ DIFFERENTIAL
BANDED NEUTROPHILS ABSOLUTE COUNT: 0.1 10*3/uL (ref 0.0–0.1)
BASOPHILS RELATIVE PERCENT: 1 %
EOSINOPHILS ABSOLUTE COUNT: 0.2 10*3/uL (ref 0.0–0.4)
EOSINOPHILS RELATIVE PERCENT: 6 %
HEMATOCRIT: 34.6 % (ref 34.0–46.6)
IMMATURE GRANULOCYTES: 4 %
LYMPHOCYTES ABSOLUTE COUNT: 0.2 10*3/uL — ABNORMAL LOW (ref 0.7–3.1)
LYMPHOCYTES RELATIVE PERCENT: 7 %
MEAN CORPUSCULAR HEMOGLOBIN CONC: 32.4 g/dL (ref 31.5–35.7)
MEAN CORPUSCULAR HEMOGLOBIN: 31.1 pg (ref 26.6–33.0)
MEAN CORPUSCULAR VOLUME: 96 fL (ref 79–97)
MONOCYTES ABSOLUTE COUNT: 0.4 10*3/uL (ref 0.1–0.9)
MONOCYTES RELATIVE PERCENT: 13 %
NEUTROPHILS ABSOLUTE COUNT: 2.2 10*3/uL (ref 1.4–7.0)
NEUTROPHILS RELATIVE PERCENT: 69 %
PLATELET COUNT: 237 10*3/uL (ref 150–450)
RED BLOOD CELL COUNT: 3.6 x10E6/uL — ABNORMAL LOW (ref 3.77–5.28)
RED CELL DISTRIBUTION WIDTH: 11.9 % (ref 11.7–15.4)
WHITE BLOOD CELL COUNT: 3.3 10*3/uL — ABNORMAL LOW (ref 3.4–10.8)

## 2019-06-30 LAB — URINALYSIS
BILIRUBIN UA: NEGATIVE
BLOOD UA: NEGATIVE
GLUCOSE UA: NEGATIVE
LEUKOCYTE ESTERASE UA: NEGATIVE
NITRITE UA: NEGATIVE
PH UA: 5.5 (ref 5.0–9.0)
RBC UA: <1 /HPF (ref <4)
SPECIFIC GRAVITY UA: 1.025 (ref 1.005–1.030)
UROBILINOGEN UA: 0.2
WBC UA: 2 /HPF (ref 0–5)

## 2019-06-30 LAB — RED CELL DISTRIBUTION WIDTH: Lab: 12.8

## 2019-06-30 LAB — PROTEIN/CREAT RATIO, URINE: Protein/Creatinine:MRto:Pt:Urine:Qn:: 2.644

## 2019-06-30 LAB — SMEAR REVIEW

## 2019-06-30 LAB — CBC W/ AUTO DIFF
BASOPHILS ABSOLUTE COUNT: 0 10*9/L (ref 0.0–0.1)
BASOPHILS RELATIVE PERCENT: 0.1 %
EOSINOPHILS ABSOLUTE COUNT: 0.2 10*9/L (ref 0.0–0.7)
EOSINOPHILS RELATIVE PERCENT: 4.5 %
HEMATOCRIT: 37.2 % (ref 35.0–44.0)
HEMOGLOBIN: 11.9 g/dL — ABNORMAL LOW (ref 12.0–15.5)
LYMPHOCYTES ABSOLUTE COUNT: 0.2 10*9/L — ABNORMAL LOW (ref 0.7–4.0)
LYMPHOCYTES RELATIVE PERCENT: 5.8 %
MEAN CORPUSCULAR HEMOGLOBIN CONC: 32.1 g/dL (ref 30.0–36.0)
MEAN CORPUSCULAR HEMOGLOBIN: 30.8 pg (ref 26.0–34.0)
MEAN CORPUSCULAR VOLUME: 96 fL (ref 82.0–98.0)
MEAN PLATELET VOLUME: 7.9 fL (ref 7.0–10.0)
MONOCYTES RELATIVE PERCENT: 10.7 %
NEUTROPHILS ABSOLUTE COUNT: 2.8 10*9/L (ref 1.7–7.7)
PLATELET COUNT: 268 10*9/L (ref 150–450)
RED BLOOD CELL COUNT: 3.87 10*12/L — ABNORMAL LOW (ref 3.90–5.03)
RED CELL DISTRIBUTION WIDTH: 12.8 % (ref 12.0–15.0)
WBC ADJUSTED: 3.6 10*9/L (ref 3.5–10.5)

## 2019-06-30 LAB — BASIC METABOLIC PANEL
ANION GAP: 6 mmol/L (ref 3–11)
BLOOD UREA NITROGEN: 15 mg/dL (ref 9–23)
BUN / CREAT RATIO: 23
CALCIUM: 10.7 mg/dL — ABNORMAL HIGH (ref 8.7–10.4)
CHLORIDE: 105 mmol/L (ref 98–107)
CO2: 24.2 mmol/L (ref 20.0–31.0)
CREATININE: 0.64 mg/dL (ref 0.60–1.10)
EGFR CKD-EPI AA FEMALE: 138 mL/min/{1.73_m2}
GLUCOSE RANDOM: 91 mg/dL (ref 70–179)
POTASSIUM: 4.4 mmol/L (ref 3.5–5.1)
SODIUM: 135 mmol/L (ref 135–145)

## 2019-06-30 LAB — RENAL FUNCTION PANEL
ALBUMIN: 4.3 g/dL (ref 3.9–5.0)
BLOOD UREA NITROGEN: 15 mg/dL (ref 6–20)
BUN / CREAT RATIO: 21 (ref 9–23)
CALCIUM: 9.9 mg/dL (ref 8.7–10.2)
CHLORIDE: 106 mmol/L (ref 96–106)
CREATININE: 0.73 mg/dL (ref 0.57–1.00)
GFR MDRD AF AMER: 128 mL/min/{1.73_m2}
GFR MDRD NON AF AMER: 111 mL/min/{1.73_m2}
GLUCOSE: 95 mg/dL (ref 65–99)
PHOSPHORUS, SERUM: 3.8 mg/dL (ref 3.0–4.3)
SODIUM: 136 mmol/L (ref 134–144)

## 2019-06-30 LAB — SODIUM
Sodium:SCnc:Pt:Ser/Plas:Qn:: 135
Sodium:SCnc:Pt:Ser/Plas:Qn:: 136

## 2019-06-30 LAB — SQUAMOUS EPITHELIAL: Lab: 8 — ABNORMAL HIGH

## 2019-06-30 LAB — MAGNESIUM
Magnesium:MCnc:Pt:Ser/Plas:Qn:: 1.5 — ABNORMAL LOW
Magnesium:MCnc:Pt:Ser/Plas:Qn:: 1.6

## 2019-06-30 LAB — HEMATOCRIT: Hematocrit:VFr:Pt:Bld:Qn:Automated count: 34.6

## 2019-06-30 LAB — PHOSPHORUS: Phosphate:MCnc:Pt:Ser/Plas:Qn:: 3.7

## 2019-06-30 LAB — PROTEIN / CREATININE RATIO, URINE: PROTEIN URINE: 151 mg/dL

## 2019-06-30 MED ORDER — CHOLECALCIFEROL (VITAMIN D3) 125 MCG (5,000 UNIT) TABLET
ORAL_TABLET | Freq: Every day | ORAL | 11 refills | 100 days | Status: CP
Start: 2019-06-30 — End: ?
  Filled 2019-07-05: qty 100, 100d supply, fill #0

## 2019-06-30 NOTE — Unmapped (Signed)
AOBP   Patient's blood pressure was taken on right upper arm, medium cuff.   1st reading 116/72, pulse: 102  2nd reading 113/74, pulse: 104  3rd reading 115/72, pulse: 102  Average reading 115/72, pulse: 103

## 2019-06-30 NOTE — Unmapped (Addendum)
Transplant Coordinator, Clinic Visit     This covering coordinator met with patient in clinic today. Pt seen today by transplant nephrology for follow up, reviewed medications and symptoms.          06/30/19 0901   BP: 115/72   Pulse: 103   Temp: 37.1 ??C (98.8 ??F)   Weight: 63.5 kg (140 lb)   Height: 157.5 cm (5' 2)   PainSc:   8   PainLoc: Generalized         Assessment  BP: pt. checking it every once in a while at home and unable to provide exact values at this time. She doesn't seem concerned with her BP and knows to call coordinator if so.   BG: n/a  Headache: yes, pt. Experiencing headaches  Since March, she believes it could be allergies, Tylenol helps. Pharmacist aware.   Hand tremors: yes, experiencing and noticed with last neurologist appt. Educated it is a common SE from immunosuppression medication, it doesn't interfere with daily ADLs and will continue to monitor.   Numbness/tingling: none  Fevers: none  Chills/sweats: none  Shortness of breath: none  Chest pain or pressure: none  Palpitations: none  Nausea/vomiting: nausea every once in a while when she has no food in her stomach  Diarrhea/constipation: diarrhea started a few weeks away, once every other week.   UTI symptoms: none  Swelling: none  Sleep: sleeping great  Pain: mouth pain w/ recent deep dental cleaning now using an electric tooth brusth   Incision: healed     Good appetite; reports adequate hydration.     Intake: pt. Drinking 8 cups - 4-16 oz bottles of water a day   Output: pt. Voiding well     Any new medications? None   Immunosuppressant last taken: pt. Took medication 11 am 4/8     Immunization status: needs COVID vaccine, patient aware and mother knows how to schedule locally.     Functional Score: 90     Able to carry on normal activity;  Minor signs or symptoms of disease.  Employment status is: Consulting civil engineer     I spent a total of  minutes with Carolyn Kelley reviewing medications and symptoms.      Patient to get [facility] labs drawn in clinic today, she is okay per MD to get weekly labs, she goes to [facility]. Goal range 6-8. Patient agrees she can receive her medications with no issues and has no other needs for her primary tnc at this time.     Drue Flirt, RN  Transplant Nurse Coordinator 07/03/2019 5:07 PM

## 2019-06-30 NOTE — Unmapped (Signed)
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 UP/C today, however, UP/C is 2.644 and has been previously normal. She has no history of rejection or DSAs. The differential diagnosis for proteinuria includes disease recurrence, rejection, TMA, or laboratory error. Will repeat UP/C and if results are similar will pursue kidney biopsy next week.     Immunosuppression Management [High Risk Medical Decision Making For Drug Therapy Requiring Intensive Monitoring For Toxicity]:   Tacrolimus level 6.0 (target 6-10). Will continue current dose of Envarsus and Myfortic 540 mg bid    Infectious Prophylaxis and Monitoring: CMV & EBV: D+/R+  The patient will continue Valcyte 3 months and Bactrim 6 months for prophylaxis. COVID-19 vaccination will be scheduled.    Seizures Disorder, no seizures since 2014:    Will continue Keppra.  She will follow up with [name], NP at [facility].  Last visit on 05/23/2018, with yearly follow ups.     Myoclonus:   Will continue Klonopin.  Will follow with [facility], continue to use walker and cane. Last see on 10/19/18. F/U 1 year.      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 Colposcopy 12/21/18 with LSILShe is due for repeat.   Discussed need for contraception and pregnancy prevention due to medications.   Immunizations: Flu Shot: 12/15/18; Prevnar 13: 02/23/19; Pneumovax: 02/04/12, COVID-19 to be scheduled.    History of Present Illness    She is a 29 y.o. 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 Her post-operative course was complicated by hypotension and hypoxia thought to be caused by anaphylactic reaction to Campath responsive to  Albuterol, epinephrine, and volume expansion. She has experienced no episodes of rejection, evidence of disease recurrence, or infectious complications.     She presents today with complaint of pain in her right mouth after dental cleaning last week. She is otherwise without new complaints. She specifically denies chest pain, shortness of breath, edema, fever, chills, dysuria, or allograft tenderness. She notes nausea and diarrhea 1 to 2 times per week. She denies seizures. Myoclonus is unchanged. She notes mild tremor, but denies headache.     Transplant History:  Date of Transplant: 03/10/19  Organ Received: Left DDKT, DBD, SCD, KDPI 2%; cold ischemia 18 hrs.  Native Kidney Disease: C3 glomerulonephropathy  CPRA:  0%  Post-Transplant Course: Complicated by anaphylactic reaction to Campath in OR  Induction: Campath  Date of Ureteral Stent Removal: 04/17/19  CMV/EBV Status: CMV D+/R+, EBV D+/R+  Rejection Episodes: None  Donor Specific Antibodies: Negative (most recent 04/18/19)   Results of Renal Imaging (pre and post):     Pre-Txp 01/18/17  Right interpolar cyst 1.4 x 1.3 x 1.1 cm (previously 0.9 x 0.9 x 0.9 cm), second, smaller cyst in the interpolar region measures 1.1 x 1.1 x 1.1 cm, left interpolar region cystic lesion with tiny peripheral echogenic focus measuring 0.7 x 0.8 x 0.7 cm. Multiple other small subcentimeter hypodensities are seen, too small to characterize. No hydronephrosis or renal calculi.     CT Renal Mass Protocol 02/25/2018  Bilateral atrophic kidneys with multiple simple cysts. No enhancing soft tissue renal mass. No hydronephrosis  or calculi.    Post-Txp 03/10/19 (txp kidney only)  Normal size and echogenicity.  No solid masses or calculi. No perinephric collections identified. No hydronephrosis    Other Past Medical History  1. C3 Glomerulopathy  2. Seizure Disorder  3. 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 HTN  5. 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    Medications  Current Outpatient Medications   Medication Sig Dispense Refill   ??? acetaminophen (TYLENOL) 500 MG tablet Take 1-2 tablets (500-1,000 mg total) by mouth every six (6) hours as needed for pain or fever (> 38C). 100 tablet 0   ??? aspirin (ECOTRIN) 81 MG tablet Take 1 tablet (81 mg total) by mouth daily. 30 tablet 11   ??? chlorhexidine (PERIDEX) 0.12 % solution 15 mL by Mouth route Two (2) times a day.     ??? clonazePAM (KLONOPIN) 0.5 MG tablet Take 1 tablet (0.5 mg total) by mouth Two (2) times a day. 60 tablet 5   ??? docusate sodium (COLACE) 100 MG capsule Take 1 capsule (100 mg total) by mouth two (2) times a day as needed for constipation. 60 capsule 0   ??? ergocalciferol-1,250 mcg, 50,000 unit, (DRISDOL) 1,250 mcg (50,000 unit) capsule Take 1 capsule (50,000 Units total) by mouth once a week. 4 capsule 2   ??? famotidine (PEPCID) 20 MG tablet Take 1 tablet (20 mg total) by mouth daily as needed for heartburn. 30 tablet 11   ??? levETIRAcetam (KEPPRA) 250 MG tablet Take 1 tablet (250 mg total) by mouth Two (2) times a day. 180 tablet 3   ??? levETIRAcetam (KEPPRA) 250 MG tablet TAKE 1 TABLET BY MOUTH TWICE DAILY. 180 tablet 3   ??? magnesium oxide-Mg AA chelate (MAGNESIUM, AMINO ACID CHELATE,) 133 mg Tab Take 2 tablets by mouth Two (2) times a day. 120 tablet 11   ??? mycophenolate (MYFORTIC) 180 MG EC tablet Take 3 tablets (540 mg total) by mouth Two (2) times a day. Adjust dose per medication card. 180 tablet 11   ??? polyethylene glycol (MIRALAX) 17 gram packet Mix 1 packet (17 g) in 8oz of water, tea, coffee, or juice and drink by mouth daily as needed (constipation). 30 each 0   ??? sulfamethoxazole-trimethoprim (BACTRIM) 400-80 mg per tablet Take 1 tablet (80 mg of trimethoprim total) by mouth Every Monday, Wednesday, and Friday. 12 tablet 5   ??? tacrolimus (ENVARSUS XR) 1 mg Tb24 extended release tablet Take 3 tablets (3 mg total) by mouth daily with 1 (4 mg) tablet for total dose of 7 mg daily 90 tablet 11   ??? tacrolimus (ENVARSUS XR) 4 mg Tb24 extended release tablet Take 2 tablets (8 mg total) by mouth daily. 60 tablet 11   ??? traMADoL (ULTRAM) 50 mg tablet Take 1-2 tablets (50-100 mg total) by mouth every twelve (12) hours as needed for pain. 30 tablet 0     No current facility-administered medications for this visit.      Physical Exam  BP 115/72 (BP Site: R Arm, BP Position: Sitting, BP Cuff Size: Medium)  - Pulse 103  - Temp 37.1 ??C (98.8 ??F) (Temporal)  - Ht 157.5 cm (5' 2)  - Wt 63.5 kg (140 lb)  - LMP  (LMP Unknown)  - BMI 25.61 kg/m??   General: no acute distress  HEENT: anicteric  Neck:  neck supple, no adenopathy  CV: normal rate, normal rhythm, no murmur, no gallops, no rubs appreciated  Lungs: clear to auscultation bilaterally  Abdomen: soft, non tender, incision well healed.  Extremities:  no edema,   Neurologic: awake, alert, and oriented x3. Patient uses a walker for ambulation due to myoclonus.     Laboratory Data and Imaging reviewed in EPIC

## 2019-06-30 NOTE — Unmapped (Signed)
[facility]  06/30/2019   Carolyn Kelley  [id number]    Medication changes today:   1. None    Education/Adherence tools provided today:  1.provided updated medication list  2. provided additional pill box education  3.  provided additional education on immunosuppression and transplant related medications including reviewing indications of medications, dosing and side effects    Follow up items:  1. goal of understanding indications and dosing of immunosuppression medications    Next visit with pharmacy in 1-3 months  ____________________________________________________________________    Carolyn Kelley is a 29 y.o. female s/p deceased kidney transplant on 08-Apr-2019 (Kidney) 2/2 C3 nephritis.     Other PMH significant for seizure disorder, PEA arrest s/p TTM in 2013 w/residual neurologic deficits    Seen by pharmacy today for: medication management and pill box fill and adherence education; last seen by pharmacy 2 months ago     CC:  Patient complains of mouth pain after recent dental apt    Vitals:    06/30/19 0934   BP: 115/72   Pulse: 103   Temp: 37.1 ??C (98.8 ??F)       Allergies   Allergen Reactions   ??? Bee Pollen Swelling     Sneezing, congestion  denies swelling.  Sneezing, congestion  denies swelling.     ??? Pollen Extracts Swelling       Medications reviewed in EPIC medication station and updated today by the clinical pharmacist practitioner.    Outpatient Encounter Medications as of 06/30/2019   Medication Sig Dispense Refill   ??? acetaminophen (TYLENOL) 500 MG tablet Take 1-2 tablets (500-1,000 mg total) by mouth every six (6) hours as needed for pain or fever (> 38C). 100 tablet 0   ??? aspirin (ECOTRIN) 81 MG tablet Take 1 tablet (81 mg total) by mouth daily. 30 tablet 11   ??? chlorhexidine (PERIDEX) 0.12 % solution 15 mL by Mouth route Two (2) times a day.     ??? clonazePAM (KLONOPIN) 0.5 MG tablet Take 1 tablet (0.5 mg total) by mouth Two (2) times a day. 60 tablet 5 ??? ergocalciferol-1,250 mcg, 50,000 unit, (DRISDOL) 1,250 mcg (50,000 unit) capsule Take 1 capsule (50,000 Units total) by mouth once a week. 4 capsule 2   ??? levETIRAcetam (KEPPRA) 250 MG tablet TAKE 1 TABLET BY MOUTH TWICE DAILY. 180 tablet 3   ??? magnesium oxide-Mg AA chelate (MAGNESIUM, AMINO ACID CHELATE,) 133 mg Tab Take 2 tablets by mouth Two (2) times a day. 120 tablet 11   ??? mycophenolate (MYFORTIC) 180 MG EC tablet Take 3 tablets (540 mg total) by mouth Two (2) times a day. Adjust dose per medication card. 180 tablet 11   ??? sulfamethoxazole-trimethoprim (BACTRIM) 400-80 mg per tablet Take 1 tablet (80 mg of trimethoprim total) by mouth Every Monday, Wednesday, and Friday. 12 tablet 5   ??? tacrolimus (ENVARSUS XR) 4 mg Tb24 extended release tablet Take 2 tablets (8 mg total) by mouth daily. 60 tablet 11   ??? [DISCONTINUED] docusate sodium (COLACE) 100 MG capsule Take 1 capsule (100 mg total) by mouth two (2) times a day as needed for constipation. (Patient not taking: Reported on 06/30/2019) 60 capsule 0   ??? [DISCONTINUED] famotidine (PEPCID) 20 MG tablet Take 1 tablet (20 mg total) by mouth daily as needed for heartburn. (Patient not taking: Reported on 06/30/2019) 30 tablet 11   ??? [DISCONTINUED] levETIRAcetam (KEPPRA) 250 MG tablet Take 1 tablet (250 mg total) by mouth  Two (2) times a day. 180 tablet 3   ??? [DISCONTINUED] polyethylene glycol (MIRALAX) 17 gram packet Mix 1 packet (17 g) in 8oz of water, tea, coffee, or juice and drink by mouth daily as needed (constipation). (Patient not taking: Reported on 06/30/2019) 30 each 0   ??? [DISCONTINUED] tacrolimus (ENVARSUS XR) 1 mg Tb24 extended release tablet Take 3 tablets (3 mg total) by mouth daily with 1 (4 mg) tablet for total dose of 7 mg daily 90 tablet 11   ??? [DISCONTINUED] traMADoL (ULTRAM) 50 mg tablet Take 1-2 tablets (50-100 mg total) by mouth every twelve (12) hours as needed for pain. 30 tablet 0     No facility-administered encounter medications on file as of 06/30/2019.        Induction agent : alemtuzumab    CURRENT IMMUNOSUPPRESSION: Envarsus 8 mg PO qd  prograf/Envarsus/cyclosporine goal: 6-8   myfortic540  mg PO bid    steroid free     Patient is tolerating immunosuppression well. Already had tremors pre transplant.    IMMUNOSUPPRESSION DRUG LEVELS:  Lab Results   Component Value Date    Tacrolimus, Trough 9.4 05/16/2019    Tacrolimus, Trough 6.1 04/18/2019    Tacrolimus, Trough 6.7 03/23/2019    Tacrolimus Lvl 6.0 06/26/2019    Tacrolimus Lvl 8.5 06/22/2019    Tacrolimus Lvl 4.2 06/20/2019     No results found for: CYCLO  No results found for: EVEROLIMUS  No results found for: SIROLIMUS    Envarsus level is accurate 24 hour trough, takes medication at 11am    Graft function: stable  DSA: ntd  Zero hour biopsy: without diagnostic abnormalities  Biopsies to date: ntd (04/18/19)  UPC: not detected 05/16/19  WBC/ANC:  low but stable    Plan: Will maintain current immunosuppression. Continue to monitor closely.    OI Prophylaxis:   CMV Status: D+/ R+, moderate risk . CMV prophylaxis: valganciclovir 450 mg daily x 3 months per protocol complete  Lab Results   Component Value Date    CMV Quant 224 (H) 05/16/2019      Estimated Creatinine Clearance: 98.7 mL/min (based on SCr of 0.73 mg/dL).  PCP Prophylaxis: bactrim SS 1 tab MWF x 6 months. End date: September 08 2019  Thrush: completed in hospital  Patient is  leukopenic    Plan: Continue per protocol. Continue to monitor.    CV Prophylaxis: asa 81 mg   The ASCVD Risk score Denman George DC Montez Hageman, et al., 2013) failed to calculate.  Statin therapy: Not indicated  Plan:  Continue to monitor     BP: Goal < 140/90. Clinic vitals reported above  Home BP ranges: 115-120s/80s  Current meds include: none  Plan: within goal. Continue to monitor    Anemia of CKD:  H/H:   Lab Results   Component Value Date    HGB 11.2 06/29/2019     Lab Results   Component Value Date    HCT 34.6 06/29/2019     Iron panel:  Lab Results   Component Value Date    IRON 55 12/23/2015    TIBC 213.1 (L) 12/23/2015    FERRITIN 588.0 (H) 12/23/2015     Lab Results   Component Value Date    Iron Saturation (%) 26 12/23/2015    Iron Saturation (%) 26 04/29/2011     Prior ESA use: none post transplant  Plan: stable. Continue to monitor.     DM:   Lab Results   Component Value  Date    A1C 4.6 (L) 03/09/2019   . Goal A1c < 7  FBG: mostly in 80s  History of Dm? No  Plan:  Continue to monitor    Fluid Intake: 64 oz daily  Plan: Continue adequate fluid intake    Electrolytes: wnl  Meds currently on: Mg plus protein 266 mg BID  Plan: Continue to monitor.    GI/BM: pt reports 1-2BM daily, occasional diarrhea (1x/week)  Meds currently on: none  Plan: Continue to monitor     Pain: pt reports moderate mouth pain after recent dentist visit  Meds currently on: APAP PRN (2 tabs yesterday for a HA)  Plan: Dr. Margaretmary Bayley asked pt to make follow up apt with dentist. Continue to monitor    Bone health:   Vitamin D Level: last level is 10.9 on 03/23/19. Goal > 30.   Last DEXA results:  none available  Current meds include: ergocalciferol 50,000 units weekly x12 weeks (started on 1/26).  Plan: Vitamin D level  out of goal. Continue supplementation. Once finishes ergocalciferol can transition to cholecalciferol 5,000 units daily  Continue to monitor.     Women's/Men's Health:  Criss Bartles is a 29 y.o. Female of childbearing age. Patient reports no men's/women's health issues.   Plan: Continue to monitor. Reports she is not sexually active at this time and understands that she should inform us if she were to become sexually active given need for contraception with Myfortic.    Seizure history - last seizure while hospitalized in 2014  Meds currently on: Keppra 250 mg BID, clonazepam 0.5 mg BID  Plan: continue to monitor    Pharmacy preference:  [facility]    Adherence: Patient has average understanding of medications; was able to independently identify names/doses of immunosuppressants and OI meds.  Patient does fill their own pill box on a regular basis at home.  Patient brought medication card:yes  Pill box:did not bring  Plan: provided moderate adherence counseling/intervention    I spent a total of 15 minutes face to face with the patient delivering clinical care and providing education/counseling.    Patient was reviewed with Dr. Margaretmary Bayley who was agreement with the stated plan:     During this visit, the following was completed:   BP log data assessment  Labs ordered and evaluated  complex treatment plan >1 DS     All questions/concerns were addressed to the patient's satisfaction.  __________________________________________    Cleone Slim, PHARMD, CPP  [employer] [employer] [facility] CLINICAL PHARMACIST PRACTITIONER  PAGER [phone]

## 2019-07-01 LAB — TACROLIMUS BLOOD: Tacrolimus:MCnc:Pt:Bld:Qn:LC/MS/MS: 4.8

## 2019-07-01 LAB — HEPATITIS C RNA, QUANTITATIVE, PCR

## 2019-07-01 LAB — TACROLIMUS, TROUGH: Lab: 4.1 — ABNORMAL LOW

## 2019-07-01 LAB — HCV RNA: Hepatitis C virus RNA:PrThr:Pt:Ser/Plas:Ord:Probe.amp.tar: NOT DETECTED

## 2019-07-02 LAB — HEPATITIS B SURFACE ANTIGEN: Hepatitis B virus surface Ag:PrThr:Pt:Ser:Ord:: NONREACTIVE

## 2019-07-02 LAB — HIV ANTIGEN/ANTIBODY COMBO: HIV 1+2 Ab+HIV1 p24 Ag:PrThr:Pt:Ser/Plas:Ord:IA: NONREACTIVE

## 2019-07-03 DIAGNOSIS — Z940 Kidney transplant status: Principal | ICD-10-CM

## 2019-07-03 DIAGNOSIS — D899 Disorder involving the immune mechanism, unspecified: Principal | ICD-10-CM

## 2019-07-03 LAB — CMV DNA, QUANTITATIVE, PCR

## 2019-07-03 LAB — CMV QUANT LOG10: Lab: 0

## 2019-07-03 MED ORDER — ACETAMINOPHEN 500 MG TABLET
ORAL_TABLET | Freq: Four times a day (QID) | ORAL | 0 refills | 13.00000 days | Status: CP | PRN
Start: 2019-07-03 — End: ?
  Filled 2019-07-05: qty 100, 13d supply, fill #0

## 2019-07-04 DIAGNOSIS — D899 Disorder involving the immune mechanism, unspecified: Principal | ICD-10-CM

## 2019-07-04 DIAGNOSIS — Z940 Kidney transplant status: Principal | ICD-10-CM

## 2019-07-04 LAB — RENAL FUNCTION PANEL
ALBUMIN: 4.1 g/dL (ref 3.9–5.0)
BLOOD UREA NITROGEN: 13 mg/dL (ref 6–20)
BUN / CREAT RATIO: 17 (ref 9–23)
CALCIUM: 9.6 mg/dL (ref 8.7–10.2)
CHLORIDE: 104 mmol/L (ref 96–106)
GFR MDRD AF AMER: 120 mL/min/{1.73_m2}
GFR MDRD NON AF AMER: 104 mL/min/{1.73_m2}
GLUCOSE: 88 mg/dL (ref 65–99)
PHOSPHORUS, SERUM: 3.6 mg/dL (ref 3.0–4.3)
SODIUM: 137 mmol/L (ref 134–144)

## 2019-07-04 LAB — CBC W/ DIFFERENTIAL
BANDED NEUTROPHILS ABSOLUTE COUNT: 0.2 10*3/uL — ABNORMAL HIGH (ref 0.0–0.1)
BASOPHILS ABSOLUTE COUNT: 0 10*3/uL (ref 0.0–0.2)
BASOPHILS RELATIVE PERCENT: 1 %
EOSINOPHILS ABSOLUTE COUNT: 0.2 10*3/uL (ref 0.0–0.4)
EOSINOPHILS RELATIVE PERCENT: 6 %
HEMATOCRIT: 32.7 % — ABNORMAL LOW (ref 34.0–46.6)
HEMOGLOBIN: 10.8 g/dL — ABNORMAL LOW (ref 11.1–15.9)
IMMATURE GRANULOCYTES: 5 %
LYMPHOCYTES ABSOLUTE COUNT: 0.3 10*3/uL — ABNORMAL LOW (ref 0.7–3.1)
LYMPHOCYTES RELATIVE PERCENT: 8 %
MEAN CORPUSCULAR HEMOGLOBIN CONC: 33 g/dL (ref 31.5–35.7)
MEAN CORPUSCULAR HEMOGLOBIN: 31.6 pg (ref 26.6–33.0)
MONOCYTES ABSOLUTE COUNT: 0.5 10*3/uL (ref 0.1–0.9)
NEUTROPHILS ABSOLUTE COUNT: 2.3 10*3/uL (ref 1.4–7.0)
NEUTROPHILS RELATIVE PERCENT: 66 %
PLATELET COUNT: 240 10*3/uL (ref 150–450)
RED BLOOD CELL COUNT: 3.42 x10E6/uL — ABNORMAL LOW (ref 3.77–5.28)
RED CELL DISTRIBUTION WIDTH: 12 % (ref 11.7–15.4)
WHITE BLOOD CELL COUNT: 3.5 10*3/uL (ref 3.4–10.8)

## 2019-07-04 LAB — PROTEIN/CREAT RATIO: Protein/Creatinine:MRto:Pt:Urine:Qn:: 104

## 2019-07-04 LAB — CREATININE: Creatinine:MCnc:Pt:Ser/Plas:Qn:: 0.77

## 2019-07-04 LAB — TACROLIMUS BLOOD: Tacrolimus:MCnc:Pt:Bld:Qn:LC/MS/MS: 5.6

## 2019-07-04 LAB — VITAMIN D, TOTAL (25OH): Lab: 19.9 — ABNORMAL LOW

## 2019-07-04 LAB — MAGNESIUM: Magnesium:MCnc:Pt:Ser/Plas:Qn:: 1.6

## 2019-07-04 LAB — IMMATURE GRANULOCYTES: Granulocytes.immature/100 leukocytes:NFr:Pt:Bld:Qn:Automated count: 5

## 2019-07-04 LAB — PROTEIN / CREATININE RATIO, URINE: PROTEIN URINE: 8.8 mg/dL

## 2019-07-04 MED FILL — MYCOPHENOLATE SODIUM 180 MG TABLET,DELAYED RELEASE: 30 days supply | Qty: 180 | Fill #3 | Status: AC

## 2019-07-04 MED FILL — MYCOPHENOLATE SODIUM 180 MG TABLET,DELAYED RELEASE: ORAL | 30 days supply | Qty: 180 | Fill #3

## 2019-07-05 LAB — MICROSCOPIC EXAMINATION
BACTERIA: NONE SEEN
WBC URINE: NONE SEEN /HPF (ref 0–5)

## 2019-07-05 LAB — URINALYSIS
BILIRUBIN UA: NEGATIVE
BLOOD UA: NEGATIVE
KETONES UA: NEGATIVE
LEUKOCYTE ESTERASE UA: NEGATIVE
NITRITE UA: NEGATIVE
PH UA: 6.5 (ref 5.0–7.5)
PROTEIN UA: NEGATIVE
SPECIFIC GRAVITY UA: 1.014 (ref 1.005–1.030)
UROBILINOGEN UA: 0.2 mg/dL (ref 0.2–1.0)

## 2019-07-05 LAB — BILIRUBIN UA: Bilirubin:PrThr:Pt:Urine:Ord:Test strip: NEGATIVE

## 2019-07-05 LAB — BK BLOOD RESULT: Lab: NOT DETECTED

## 2019-07-05 LAB — BK VIRUS QUANTITATIVE PCR, BLOOD

## 2019-07-05 LAB — HBV DNA QUANT (MAYO): Hepatitis B virus DNA:ACnc:Pt:Ser/Plas:Qn:Probe.amp.tar: NOT DETECTED

## 2019-07-05 LAB — RBC URINE

## 2019-07-05 MED FILL — ACETAMINOPHEN 500 MG TABLET: 13 days supply | Qty: 100 | Fill #0 | Status: AC

## 2019-07-05 MED FILL — CHOLECALCIFEROL (VITAMIN D3) 125 MCG (5,000 UNIT) CAPSULE: 100 days supply | Qty: 100 | Fill #0 | Status: AC

## 2019-07-05 NOTE — Unmapped (Signed)
[facility] Refill Coordination Note    Specialty Medication(s) to be Shipped:   Transplant: Envarsus 4mg     Other medication(s) to be shipped: clonazepam 0.5mg  and magnesium     Carolyn Kelley, DOB: 24-Apr-1990  Phone: [phone] (home)       All above HIPAA information was verified with patient.     Was a Nurse, learning disability used for this call? No    Completed refill call assessment today to schedule patient's medication shipment from the [facility] [phone]).       Specialty medication(s) and dose(s) confirmed: Regimen is correct and unchanged.   Changes to medications: Carolyn Kelley reports no changes at this time.  Changes to insurance: No  Questions for the pharmacist: No    Confirmed patient received Welcome Packet with first shipment. The patient will receive a drug information handout for each medication shipped and additional FDA Medication Guides as required.       DISEASE/MEDICATION-SPECIFIC INFORMATION        N/A    SPECIALTY MEDICATION ADHERENCE     Medication Adherence    Patient reported X missed doses in the last month: 0  Specialty Medication: Envarsus 4mg   Patient is on additional specialty medications: No          Envarsus 4 mg: 10 days of medicine on hand     SHIPPING     Shipping address confirmed in Epic.     Delivery Scheduled: Yes, Expected medication delivery date: 07/13/2019.     Medication will be delivered via UPS to the prescription address in Epic WAM.    [name] [facility] Specialty Technician

## 2019-07-06 ENCOUNTER — Ambulatory Visit: Payer: Medicaid Other | Attending: Internal Medicine

## 2019-07-06 DIAGNOSIS — Z23 Encounter for immunization: Secondary | ICD-10-CM

## 2019-07-06 DIAGNOSIS — Z940 Kidney transplant status: Principal | ICD-10-CM

## 2019-07-06 DIAGNOSIS — D849 Immunodeficiency, unspecified: Principal | ICD-10-CM

## 2019-07-06 LAB — SPECIMEN STATUS REPORT

## 2019-07-06 NOTE — Progress Notes (Signed)
   Covid-19 Vaccination Clinic  Name:  Heather Sims    MRN: [id number] DOB: 09/09/1990  07/06/2019  [name] was observed post Covid-19 immunization for 15 minutes without incident. She was provided with Vaccine Information Sheet and instruction to access the V-Safe system.   [name] was instructed to call 911 with any severe reactions post vaccine: Marland Kitchen Difficulty breathing  . Swelling of face and throat  . A fast heartbeat  . A bad rash all over body  . Dizziness and weakness   Immunizations Administered    Name Date Dose VIS Date Route   Pfizer COVID-19 Vaccine 07/06/2019  9:06 AM 0.3 mL 03/03/2019 Intramuscular   Manufacturer: [place]   Lot: [id number]   [place]: [id number]

## 2019-07-07 LAB — RENAL FUNCTION PANEL
ALBUMIN: 4.6 g/dL (ref 3.9–5.0)
BLOOD UREA NITROGEN: 18 mg/dL (ref 6–20)
BUN / CREAT RATIO: 24 — ABNORMAL HIGH (ref 9–23)
CHLORIDE: 106 mmol/L (ref 96–106)
CO2: 17 mmol/L — ABNORMAL LOW (ref 20–29)
CREATININE: 0.76 mg/dL (ref 0.57–1.00)
GFR MDRD NON AF AMER: 106 mL/min/{1.73_m2}
GLUCOSE: 91 mg/dL (ref 65–99)
PHOSPHORUS, SERUM: 4.1 mg/dL (ref 3.0–4.3)
POTASSIUM: 5.1 mmol/L (ref 3.5–5.2)

## 2019-07-07 LAB — CBC W/ DIFFERENTIAL
BANDED NEUTROPHILS ABSOLUTE COUNT: 0.1 10*3/uL (ref 0.0–0.1)
BASOPHILS ABSOLUTE COUNT: 0.1 10*3/uL (ref 0.0–0.2)
BASOPHILS RELATIVE PERCENT: 1 %
EOSINOPHILS ABSOLUTE COUNT: 0.2 10*3/uL (ref 0.0–0.4)
HEMATOCRIT: 34.5 % (ref 34.0–46.6)
HEMOGLOBIN: 11.3 g/dL (ref 11.1–15.9)
IMMATURE GRANULOCYTES: 2 %
LYMPHOCYTES ABSOLUTE COUNT: 0.4 10*3/uL — ABNORMAL LOW (ref 0.7–3.1)
LYMPHOCYTES RELATIVE PERCENT: 10 %
MEAN CORPUSCULAR HEMOGLOBIN CONC: 32.8 g/dL (ref 31.5–35.7)
MEAN CORPUSCULAR HEMOGLOBIN: 31 pg (ref 26.6–33.0)
MEAN CORPUSCULAR VOLUME: 95 fL (ref 79–97)
MONOCYTES ABSOLUTE COUNT: 0.4 10*3/uL (ref 0.1–0.9)
MONOCYTES RELATIVE PERCENT: 10 %
NEUTROPHILS ABSOLUTE COUNT: 2.5 10*3/uL (ref 1.4–7.0)
NEUTROPHILS RELATIVE PERCENT: 70 %
PLATELET COUNT: 254 10*3/uL (ref 150–450)
RED CELL DISTRIBUTION WIDTH: 12.2 % (ref 11.7–15.4)
WHITE BLOOD CELL COUNT: 3.6 10*3/uL (ref 3.4–10.8)

## 2019-07-07 LAB — ALBUMIN: Albumin:MCnc:Pt:Ser/Plas:Qn:: 4.6

## 2019-07-07 LAB — MAGNESIUM: Magnesium:MCnc:Pt:Ser/Plas:Qn:: 1.8

## 2019-07-07 LAB — BASOPHILS ABSOLUTE COUNT: Basophils:NCnc:Pt:Bld:Qn:Automated count: 0.1

## 2019-07-08 LAB — TACROLIMUS BLOOD: Tacrolimus:MCnc:Pt:Bld:Qn:LC/MS/MS: 4.7

## 2019-07-10 DIAGNOSIS — Z940 Kidney transplant status: Principal | ICD-10-CM

## 2019-07-10 DIAGNOSIS — D849 Immunodeficiency, unspecified: Principal | ICD-10-CM

## 2019-07-11 DIAGNOSIS — Z940 Kidney transplant status: Principal | ICD-10-CM

## 2019-07-11 DIAGNOSIS — D849 Immunodeficiency, unspecified: Principal | ICD-10-CM

## 2019-07-11 LAB — CBC W/ DIFFERENTIAL
BANDED NEUTROPHILS ABSOLUTE COUNT: 0.1 10*3/uL (ref 0.0–0.1)
BASOPHILS RELATIVE PERCENT: 1 %
EOSINOPHILS ABSOLUTE COUNT: 0.2 10*3/uL (ref 0.0–0.4)
HEMATOCRIT: 33.8 % — ABNORMAL LOW (ref 34.0–46.6)
HEMOGLOBIN: 11.5 g/dL (ref 11.1–15.9)
IMMATURE GRANULOCYTES: 2 %
LYMPHOCYTES ABSOLUTE COUNT: 0.4 10*3/uL — ABNORMAL LOW (ref 0.7–3.1)
LYMPHOCYTES RELATIVE PERCENT: 12 %
MEAN CORPUSCULAR HEMOGLOBIN CONC: 34 g/dL (ref 31.5–35.7)
MEAN CORPUSCULAR HEMOGLOBIN: 31.7 pg (ref 26.6–33.0)
MEAN CORPUSCULAR VOLUME: 93 fL (ref 79–97)
MONOCYTES ABSOLUTE COUNT: 0.4 10*3/uL (ref 0.1–0.9)
MONOCYTES RELATIVE PERCENT: 11 %
NEUTROPHILS ABSOLUTE COUNT: 2.2 10*3/uL (ref 1.4–7.0)
NEUTROPHILS RELATIVE PERCENT: 67 %
PLATELET COUNT: 250 10*3/uL (ref 150–450)
RED BLOOD CELL COUNT: 3.63 x10E6/uL — ABNORMAL LOW (ref 3.77–5.28)
RED CELL DISTRIBUTION WIDTH: 11.9 % (ref 11.7–15.4)
WHITE BLOOD CELL COUNT: 3.3 10*3/uL — ABNORMAL LOW (ref 3.4–10.8)

## 2019-07-11 LAB — RENAL FUNCTION PANEL
ALBUMIN: 4.2 g/dL (ref 3.9–5.0)
BLOOD UREA NITROGEN: 19 mg/dL (ref 6–20)
BUN / CREAT RATIO: 23 (ref 9–23)
CALCIUM: 9.8 mg/dL (ref 8.7–10.2)
CHLORIDE: 107 mmol/L — ABNORMAL HIGH (ref 96–106)
GFR MDRD AF AMER: 108 mL/min/{1.73_m2}
GFR MDRD NON AF AMER: 94 mL/min/{1.73_m2}
GLUCOSE: 93 mg/dL (ref 65–99)
PHOSPHORUS, SERUM: 3.8 mg/dL (ref 3.0–4.3)
POTASSIUM: 4.6 mmol/L (ref 3.5–5.2)
SODIUM: 137 mmol/L (ref 134–144)

## 2019-07-11 LAB — GLUCOSE: Glucose:MCnc:Pt:Ser/Plas:Qn:: 93

## 2019-07-11 LAB — MAGNESIUM: Magnesium:MCnc:Pt:Ser/Plas:Qn:: 1.6

## 2019-07-11 LAB — EOSINOPHILS ABSOLUTE COUNT: Eosinophils:NCnc:Pt:Bld:Qn:Automated count: 0.2

## 2019-07-11 NOTE — Unmapped (Signed)
Reviewed Friday's tac level with Dr Margaretmary Bayley and he would like to see Monday 4.19 level before making changes

## 2019-07-12 LAB — VITAMIN D 1,25-DIHYDROXY: 1,25-Dihydroxyvitamin D:MCnc:Pt:Ser/Plas:Qn:: 61

## 2019-07-12 LAB — TACROLIMUS BLOOD: Tacrolimus:MCnc:Pt:Bld:Qn:LC/MS/MS: 6.2

## 2019-07-12 MED FILL — MG-PLUS-PROTEIN 133 MG TABLET: ORAL | 30 days supply | Qty: 120 | Fill #3

## 2019-07-12 MED FILL — CLONAZEPAM 0.5 MG TABLET: 30 days supply | Qty: 60 | Fill #3 | Status: AC

## 2019-07-12 MED FILL — ENVARSUS XR 4 MG TABLET,EXTENDED RELEASE: 30 days supply | Qty: 60 | Fill #1 | Status: AC

## 2019-07-12 MED FILL — ENVARSUS XR 4 MG TABLET,EXTENDED RELEASE: ORAL | 30 days supply | Qty: 60 | Fill #1

## 2019-07-12 MED FILL — MG-PLUS-PROTEIN 133 MG TABLET: 30 days supply | Qty: 120 | Fill #3 | Status: AC

## 2019-07-12 MED FILL — CLONAZEPAM 0.5 MG TABLET: ORAL | 30 days supply | Qty: 60 | Fill #3

## 2019-07-13 DIAGNOSIS — Z940 Kidney transplant status: Principal | ICD-10-CM

## 2019-07-13 DIAGNOSIS — D849 Immunodeficiency, unspecified: Principal | ICD-10-CM

## 2019-07-14 LAB — CBC W/ DIFFERENTIAL
BASOPHILS ABSOLUTE COUNT: 0 10*3/uL (ref 0.0–0.2)
BASOPHILS RELATIVE PERCENT: 1 %
EOSINOPHILS ABSOLUTE COUNT: 0.2 10*3/uL (ref 0.0–0.4)
EOSINOPHILS RELATIVE PERCENT: 7 %
HEMATOCRIT: 32.2 % — ABNORMAL LOW (ref 34.0–46.6)
HEMOGLOBIN: 10.7 g/dL — ABNORMAL LOW (ref 11.1–15.9)
LYMPHOCYTES ABSOLUTE COUNT: 0.2 10*3/uL — ABNORMAL LOW (ref 0.7–3.1)
LYMPHOCYTES RELATIVE PERCENT: 7 %
MEAN CORPUSCULAR HEMOGLOBIN CONC: 33.2 g/dL (ref 31.5–35.7)
MEAN CORPUSCULAR VOLUME: 93 fL (ref 79–97)
MONOCYTES ABSOLUTE COUNT: 0.3 10*3/uL (ref 0.1–0.9)
MONOCYTES RELATIVE PERCENT: 9 %
NEUTROPHILS RELATIVE PERCENT: 75 %
PLATELET COUNT: 260 10*3/uL (ref 150–450)
RED BLOOD CELL COUNT: 3.46 x10E6/uL — ABNORMAL LOW (ref 3.77–5.28)
WHITE BLOOD CELL COUNT: 3.1 10*3/uL — ABNORMAL LOW (ref 3.4–10.8)

## 2019-07-14 LAB — MAGNESIUM: Magnesium:MCnc:Pt:Ser/Plas:Qn:: 1.6

## 2019-07-14 LAB — RENAL FUNCTION PANEL
BLOOD UREA NITROGEN: 17 mg/dL (ref 6–20)
BUN / CREAT RATIO: 19 (ref 9–23)
CALCIUM: 9.5 mg/dL (ref 8.7–10.2)
CHLORIDE: 109 mmol/L — ABNORMAL HIGH (ref 96–106)
CO2: 18 mmol/L — ABNORMAL LOW (ref 20–29)
CREATININE: 0.91 mg/dL (ref 0.57–1.00)
GFR MDRD NON AF AMER: 85 mL/min/{1.73_m2}
GLUCOSE: 93 mg/dL (ref 65–99)
PHOSPHORUS, SERUM: 4.1 mg/dL (ref 3.0–4.3)
POTASSIUM: 4.9 mmol/L (ref 3.5–5.2)
SODIUM: 138 mmol/L (ref 134–144)

## 2019-07-14 LAB — MONOCYTES RELATIVE PERCENT: Monocytes/100 leukocytes:NFr:Pt:Bld:Qn:Automated count: 9

## 2019-07-14 LAB — ALBUMIN: Albumin:MCnc:Pt:Ser/Plas:Qn:: 4

## 2019-07-14 LAB — METAMYLOCYTES-LABCORP: Metamyelocytes/100 leukocytes:NFr:Pt:Bld:Qn:: 1 — ABNORMAL HIGH

## 2019-07-15 LAB — TACROLIMUS LEVEL: TACROLIMUS BLOOD: 4.6 ng/mL (ref 2.0–20.0)

## 2019-07-15 LAB — TACROLIMUS BLOOD: Tacrolimus:MCnc:Pt:Bld:Qn:LC/MS/MS: 4.6

## 2019-07-17 DIAGNOSIS — D849 Immunodeficiency, unspecified: Principal | ICD-10-CM

## 2019-07-17 DIAGNOSIS — Z940 Kidney transplant status: Principal | ICD-10-CM

## 2019-07-17 MED FILL — SULFAMETHOXAZOLE 400 MG-TRIMETHOPRIM 80 MG TABLET: ORAL | 28 days supply | Qty: 12 | Fill #4

## 2019-07-17 MED FILL — SULFAMETHOXAZOLE 400 MG-TRIMETHOPRIM 80 MG TABLET: 28 days supply | Qty: 12 | Fill #4 | Status: AC

## 2019-07-18 DIAGNOSIS — D849 Immunodeficiency, unspecified: Principal | ICD-10-CM

## 2019-07-18 DIAGNOSIS — Z940 Kidney transplant status: Principal | ICD-10-CM

## 2019-07-18 LAB — CBC W/ DIFFERENTIAL
BANDED NEUTROPHILS ABSOLUTE COUNT: 0 10*3/uL (ref 0.0–0.1)
BASOPHILS ABSOLUTE COUNT: 0 10*3/uL (ref 0.0–0.2)
BASOPHILS RELATIVE PERCENT: 1 %
EOSINOPHILS ABSOLUTE COUNT: 0.2 10*3/uL (ref 0.0–0.4)
EOSINOPHILS RELATIVE PERCENT: 7 %
HEMATOCRIT: 35.2 % (ref 34.0–46.6)
HEMOGLOBIN: 11.3 g/dL (ref 11.1–15.9)
IMMATURE GRANULOCYTES: 1 %
MEAN CORPUSCULAR HEMOGLOBIN CONC: 32.1 g/dL (ref 31.5–35.7)
MEAN CORPUSCULAR HEMOGLOBIN: 31 pg (ref 26.6–33.0)
MEAN CORPUSCULAR VOLUME: 97 fL (ref 79–97)
MONOCYTES ABSOLUTE COUNT: 0.3 10*3/uL (ref 0.1–0.9)
MONOCYTES RELATIVE PERCENT: 11 %
NEUTROPHILS ABSOLUTE COUNT: 1.9 10*3/uL (ref 1.4–7.0)
NEUTROPHILS RELATIVE PERCENT: 67 %
PLATELET COUNT: 263 10*3/uL (ref 150–450)
RED BLOOD CELL COUNT: 3.64 x10E6/uL — ABNORMAL LOW (ref 3.77–5.28)
RED CELL DISTRIBUTION WIDTH: 12 % (ref 11.7–15.4)
WHITE BLOOD CELL COUNT: 2.8 10*3/uL — ABNORMAL LOW (ref 3.4–10.8)

## 2019-07-18 LAB — RENAL FUNCTION PANEL
ALBUMIN: 4.3 g/dL (ref 3.9–5.0)
BLOOD UREA NITROGEN: 23 mg/dL — ABNORMAL HIGH (ref 6–20)
BUN / CREAT RATIO: 23 (ref 9–23)
CALCIUM: 10 mg/dL (ref 8.7–10.2)
CHLORIDE: 105 mmol/L (ref 96–106)
CREATININE: 1.01 mg/dL — ABNORMAL HIGH (ref 0.57–1.00)
GFR MDRD AF AMER: 86 mL/min/{1.73_m2}
PHOSPHORUS, SERUM: 3.9 mg/dL (ref 3.0–4.3)
SODIUM: 136 mmol/L (ref 134–144)

## 2019-07-18 LAB — LYMPHOCYTES ABSOLUTE COUNT: Lymphocytes:NCnc:Pt:Bld:Qn:Automated count: 0.4 — ABNORMAL LOW

## 2019-07-18 LAB — TACROLIMUS BLOOD: Tacrolimus:MCnc:Pt:Bld:Qn:LC/MS/MS: 6.3

## 2019-07-18 LAB — MAGNESIUM: Magnesium:MCnc:Pt:Ser/Plas:Qn:: 1.7

## 2019-07-18 LAB — CO2: Carbon dioxide:SCnc:Pt:Ser/Plas:Qn:: 17 — ABNORMAL LOW

## 2019-07-20 DIAGNOSIS — D849 Immunodeficiency, unspecified: Principal | ICD-10-CM

## 2019-07-20 DIAGNOSIS — Z940 Kidney transplant status: Principal | ICD-10-CM

## 2019-07-21 LAB — RENAL FUNCTION PANEL
ALBUMIN: 4.6 g/dL (ref 3.9–5.0)
BLOOD UREA NITROGEN: 23 mg/dL — ABNORMAL HIGH (ref 6–20)
BUN / CREAT RATIO: 23 (ref 9–23)
CALCIUM: 9.9 mg/dL (ref 8.7–10.2)
CHLORIDE: 105 mmol/L (ref 96–106)
CO2: 19 mmol/L — ABNORMAL LOW (ref 20–29)
CREATININE: 1 mg/dL (ref 0.57–1.00)
GFR MDRD AF AMER: 87 mL/min/{1.73_m2}
GFR MDRD NON AF AMER: 76 mL/min/{1.73_m2}
PHOSPHORUS, SERUM: 3.7 mg/dL (ref 3.0–4.3)
POTASSIUM: 4.7 mmol/L (ref 3.5–5.2)
SODIUM: 138 mmol/L (ref 134–144)

## 2019-07-21 LAB — CBC W/ DIFFERENTIAL
BANDED NEUTROPHILS ABSOLUTE COUNT: 0.1 10*3/uL (ref 0.0–0.1)
BASOPHILS ABSOLUTE COUNT: 0.1 10*3/uL (ref 0.0–0.2)
BASOPHILS RELATIVE PERCENT: 2 %
EOSINOPHILS ABSOLUTE COUNT: 0.2 10*3/uL (ref 0.0–0.4)
EOSINOPHILS RELATIVE PERCENT: 7 %
HEMATOCRIT: 33.7 % — ABNORMAL LOW (ref 34.0–46.6)
HEMOGLOBIN: 11 g/dL — ABNORMAL LOW (ref 11.1–15.9)
IMMATURE GRANULOCYTES: 2 %
LYMPHOCYTES RELATIVE PERCENT: 14 %
MEAN CORPUSCULAR HEMOGLOBIN CONC: 32.6 g/dL (ref 31.5–35.7)
MEAN CORPUSCULAR HEMOGLOBIN: 30.6 pg (ref 26.6–33.0)
MEAN CORPUSCULAR VOLUME: 94 fL (ref 79–97)
MONOCYTES RELATIVE PERCENT: 14 %
NEUTROPHILS ABSOLUTE COUNT: 1.6 10*3/uL (ref 1.4–7.0)
NEUTROPHILS RELATIVE PERCENT: 61 %
PLATELET COUNT: 252 10*3/uL (ref 150–450)
RED BLOOD CELL COUNT: 3.6 x10E6/uL — ABNORMAL LOW (ref 3.77–5.28)
RED CELL DISTRIBUTION WIDTH: 11.8 % (ref 11.7–15.4)
WHITE BLOOD CELL COUNT: 2.6 10*3/uL — ABNORMAL LOW (ref 3.4–10.8)

## 2019-07-21 LAB — LYMPHOCYTES RELATIVE PERCENT: Lymphocytes/100 leukocytes:NFr:Pt:Bld:Qn:Automated count: 14

## 2019-07-21 LAB — MAGNESIUM: Magnesium:MCnc:Pt:Ser/Plas:Qn:: 1.8

## 2019-07-21 LAB — POTASSIUM: Potassium:SCnc:Pt:Ser/Plas:Qn:: 4.7

## 2019-07-22 LAB — TACROLIMUS BLOOD: Tacrolimus:MCnc:Pt:Bld:Qn:LC/MS/MS: 8.4

## 2019-07-24 DIAGNOSIS — Z940 Kidney transplant status: Principal | ICD-10-CM

## 2019-07-24 DIAGNOSIS — D849 Immunodeficiency, unspecified: Principal | ICD-10-CM

## 2019-07-25 DIAGNOSIS — D849 Immunodeficiency, unspecified: Principal | ICD-10-CM

## 2019-07-25 DIAGNOSIS — Z940 Kidney transplant status: Principal | ICD-10-CM

## 2019-07-25 LAB — NEUTROPHILS ABSOLUTE COUNT: Neutrophils:NCnc:Pt:Bld:Qn:Automated count: 1.6

## 2019-07-25 LAB — CBC W/ DIFFERENTIAL
BANDED NEUTROPHILS ABSOLUTE COUNT: 0.1 10*3/uL (ref 0.0–0.1)
BASOPHILS ABSOLUTE COUNT: 0.1 10*3/uL (ref 0.0–0.2)
BASOPHILS RELATIVE PERCENT: 2 %
EOSINOPHILS ABSOLUTE COUNT: 0.2 10*3/uL (ref 0.0–0.4)
EOSINOPHILS RELATIVE PERCENT: 9 %
HEMATOCRIT: 32.6 % — ABNORMAL LOW (ref 34.0–46.6)
HEMOGLOBIN: 10.7 g/dL — ABNORMAL LOW (ref 11.1–15.9)
IMMATURE GRANULOCYTES: 2 %
LYMPHOCYTES ABSOLUTE COUNT: 0.4 10*3/uL — ABNORMAL LOW (ref 0.7–3.1)
LYMPHOCYTES RELATIVE PERCENT: 13 %
MEAN CORPUSCULAR HEMOGLOBIN CONC: 32.8 g/dL (ref 31.5–35.7)
MEAN CORPUSCULAR HEMOGLOBIN: 30.4 pg (ref 26.6–33.0)
MONOCYTES ABSOLUTE COUNT: 0.4 10*3/uL (ref 0.1–0.9)
MONOCYTES RELATIVE PERCENT: 15 %
NEUTROPHILS ABSOLUTE COUNT: 1.6 10*3/uL (ref 1.4–7.0)
PLATELET COUNT: 236 10*3/uL (ref 150–450)
RED BLOOD CELL COUNT: 3.52 x10E6/uL — ABNORMAL LOW (ref 3.77–5.28)
RED CELL DISTRIBUTION WIDTH: 12 % (ref 11.7–15.4)
WHITE BLOOD CELL COUNT: 2.7 10*3/uL — ABNORMAL LOW (ref 3.4–10.8)

## 2019-07-25 LAB — RENAL FUNCTION PANEL
ALBUMIN: 4.3 g/dL (ref 3.9–5.0)
BUN / CREAT RATIO: 33 — ABNORMAL HIGH (ref 9–23)
CALCIUM: 9.9 mg/dL (ref 8.7–10.2)
CHLORIDE: 106 mmol/L (ref 96–106)
CO2: 17 mmol/L — ABNORMAL LOW (ref 20–29)
CREATININE: 0.89 mg/dL (ref 0.57–1.00)
GFR MDRD NON AF AMER: 87 mL/min/{1.73_m2}
PHOSPHORUS, SERUM: 3.8 mg/dL (ref 3.0–4.3)
POTASSIUM: 5.1 mmol/L (ref 3.5–5.2)
SODIUM: 136 mmol/L (ref 134–144)

## 2019-07-25 LAB — ALBUMIN: Albumin:MCnc:Pt:Ser/Plas:Qn:: 4.3

## 2019-07-25 LAB — MAGNESIUM: Magnesium:MCnc:Pt:Ser/Plas:Qn:: 1.8

## 2019-07-26 LAB — TACROLIMUS BLOOD: Tacrolimus:MCnc:Pt:Bld:Qn:LC/MS/MS: 7.1

## 2019-07-27 DIAGNOSIS — D849 Immunodeficiency, unspecified: Principal | ICD-10-CM

## 2019-07-27 DIAGNOSIS — Z940 Kidney transplant status: Principal | ICD-10-CM

## 2019-07-27 LAB — CMV QUANT: Cytomegalovirus DNA:ACnc:Pt:Plas:Qn:Probe.amp.tar: POSITIVE

## 2019-07-28 LAB — CBC W/ DIFFERENTIAL
BANDED NEUTROPHILS ABSOLUTE COUNT: 0 10*3/uL (ref 0.0–0.1)
BASOPHILS ABSOLUTE COUNT: 0 10*3/uL (ref 0.0–0.2)
BASOPHILS RELATIVE PERCENT: 1 %
EOSINOPHILS ABSOLUTE COUNT: 0.2 10*3/uL (ref 0.0–0.4)
EOSINOPHILS RELATIVE PERCENT: 9 %
HEMATOCRIT: 34.3 % (ref 34.0–46.6)
HEMOGLOBIN: 11.2 g/dL (ref 11.1–15.9)
IMMATURE GRANULOCYTES: 1 %
LYMPHOCYTES ABSOLUTE COUNT: 0.5 10*3/uL — ABNORMAL LOW (ref 0.7–3.1)
LYMPHOCYTES RELATIVE PERCENT: 18 %
MEAN CORPUSCULAR HEMOGLOBIN CONC: 32.7 g/dL (ref 31.5–35.7)
MEAN CORPUSCULAR HEMOGLOBIN: 30.8 pg (ref 26.6–33.0)
MEAN CORPUSCULAR VOLUME: 94 fL (ref 79–97)
MONOCYTES RELATIVE PERCENT: 13 %
NEUTROPHILS ABSOLUTE COUNT: 1.6 10*3/uL (ref 1.4–7.0)
NEUTROPHILS RELATIVE PERCENT: 58 %
RED BLOOD CELL COUNT: 3.64 x10E6/uL — ABNORMAL LOW (ref 3.77–5.28)
RED CELL DISTRIBUTION WIDTH: 11.8 % (ref 11.7–15.4)
WHITE BLOOD CELL COUNT: 2.8 10*3/uL — ABNORMAL LOW (ref 3.4–10.8)

## 2019-07-28 LAB — RENAL FUNCTION PANEL
BLOOD UREA NITROGEN: 18 mg/dL (ref 6–20)
BUN / CREAT RATIO: 20 (ref 9–23)
CHLORIDE: 107 mmol/L — ABNORMAL HIGH (ref 96–106)
CO2: 19 mmol/L — ABNORMAL LOW (ref 20–29)
CREATININE: 0.92 mg/dL (ref 0.57–1.00)
GFR MDRD NON AF AMER: 84 mL/min/{1.73_m2}
GLUCOSE: 93 mg/dL (ref 65–99)
PHOSPHORUS, SERUM: 3.9 mg/dL (ref 3.0–4.3)
POTASSIUM: 4.8 mmol/L (ref 3.5–5.2)
SODIUM: 138 mmol/L (ref 134–144)

## 2019-07-28 LAB — ALBUMIN: Albumin:MCnc:Pt:Ser/Plas:Qn:: 4.2

## 2019-07-28 LAB — NEUTROPHILS RELATIVE PERCENT: Neutrophils/100 leukocytes:NFr:Pt:Bld:Qn:Automated count: 58

## 2019-07-28 LAB — MAGNESIUM: Magnesium:MCnc:Pt:Ser/Plas:Qn:: 1.9

## 2019-07-29 LAB — TACROLIMUS BLOOD: Tacrolimus:MCnc:Pt:Bld:Qn:LC/MS/MS: 5.8

## 2019-07-31 ENCOUNTER — Ambulatory Visit: Payer: Medicaid Other | Attending: Internal Medicine

## 2019-07-31 DIAGNOSIS — Z23 Encounter for immunization: Secondary | ICD-10-CM

## 2019-07-31 DIAGNOSIS — D849 Immunodeficiency, unspecified: Principal | ICD-10-CM

## 2019-07-31 DIAGNOSIS — Z940 Kidney transplant status: Principal | ICD-10-CM

## 2019-07-31 NOTE — Progress Notes (Signed)
   Covid-19 Vaccination Clinic  Name:  Heather Sims    MRN: [id number] DOB: 01-29-1991  07/31/2019  [name] was observed post Covid-19 immunization for 15 minutes without incident. She was provided with Vaccine Information Sheet and instruction to access the V-Safe system.   [name] was instructed to call 911 with any severe reactions post vaccine: Marland Kitchen Difficulty breathing  . Swelling of face and throat  . A fast heartbeat  . A bad rash all over body  . Dizziness and weakness   Immunizations Administered    Name Date Dose VIS Date Route   Pfizer COVID-19 Vaccine 07/31/2019  9:12 AM 0.3 mL 05/17/2018 Intramuscular   Manufacturer: [place]   Lot: [id number]   [place]: [id number]

## 2019-07-31 NOTE — Unmapped (Signed)
[facility] Refill Coordination Note    Specialty Medication(s) to be Shipped:   Transplant: Envarsus 4mg  and mycophenolate mofetil 180mg     Other medication(s) to be shipped: magnesium 133 mg     Carolyn Kelley, DOB: September 17, 1990  Phone: [phone] (home)       All above HIPAA information was verified with patient.     Was a Nurse, learning disability used for this call? No    Completed refill call assessment today to schedule patient's medication shipment from the [facility] [phone]).       Specialty medication(s) and dose(s) confirmed: Regimen is correct and unchanged.   Changes to medications: Ronie reports no changes at this time.  Changes to insurance: No  Questions for the pharmacist: No    Confirmed patient received Welcome Packet with first shipment. The patient will receive a drug information handout for each medication shipped and additional FDA Medication Guides as required.       DISEASE/MEDICATION-SPECIFIC INFORMATION        N/A    SPECIALTY MEDICATION ADHERENCE     Medication Adherence    Patient reported X missed doses in the last month: 0  Specialty Medication: Envarsus xr 4mg   Patient is on additional specialty medications: Yes  Additional Specialty Medications: Mycophenolate 180 mg  Patient Reported Additional Medication X Missed Doses in the Last Month: 0                Envarsus XR 4 mg: 10 days of medicine on hand   Mycophenolate 180 mg: 10 days of medicine on hand           SHIPPING     Shipping address confirmed in Epic.     Delivery Scheduled: Yes, Expected medication delivery date: 08/04/19.     Medication will be delivered via UPS to the prescription address in Epic WAM.    Unk Lightning   [facility] Specialty Technician

## 2019-08-01 DIAGNOSIS — D849 Immunodeficiency, unspecified: Principal | ICD-10-CM

## 2019-08-01 DIAGNOSIS — Z940 Kidney transplant status: Principal | ICD-10-CM

## 2019-08-01 LAB — PHOSPHORUS, SERUM: Phosphate:MCnc:Pt:Ser/Plas:Qn:: 3.9

## 2019-08-01 LAB — CBC W/ DIFFERENTIAL
BANDED NEUTROPHILS ABSOLUTE COUNT: 0.1 10*3/uL (ref 0.0–0.1)
BASOPHILS ABSOLUTE COUNT: 0 10*3/uL (ref 0.0–0.2)
BASOPHILS RELATIVE PERCENT: 1 %
EOSINOPHILS ABSOLUTE COUNT: 0.2 10*3/uL (ref 0.0–0.4)
EOSINOPHILS RELATIVE PERCENT: 7 %
HEMATOCRIT: 33.9 % — ABNORMAL LOW (ref 34.0–46.6)
HEMOGLOBIN: 11 g/dL — ABNORMAL LOW (ref 11.1–15.9)
IMMATURE GRANULOCYTES: 2 %
LYMPHOCYTES ABSOLUTE COUNT: 0.4 10*3/uL — ABNORMAL LOW (ref 0.7–3.1)
LYMPHOCYTES RELATIVE PERCENT: 15 %
MEAN CORPUSCULAR HEMOGLOBIN CONC: 32.4 g/dL (ref 31.5–35.7)
MEAN CORPUSCULAR HEMOGLOBIN: 30.6 pg (ref 26.6–33.0)
MONOCYTES ABSOLUTE COUNT: 0.4 10*3/uL (ref 0.1–0.9)
MONOCYTES RELATIVE PERCENT: 15 %
NEUTROPHILS ABSOLUTE COUNT: 1.7 10*3/uL (ref 1.4–7.0)
NEUTROPHILS RELATIVE PERCENT: 60 %
RED BLOOD CELL COUNT: 3.6 x10E6/uL — ABNORMAL LOW (ref 3.77–5.28)
RED CELL DISTRIBUTION WIDTH: 11.9 % (ref 11.7–15.4)
WHITE BLOOD CELL COUNT: 2.8 10*3/uL — ABNORMAL LOW (ref 3.4–10.8)

## 2019-08-01 LAB — RENAL FUNCTION PANEL
ALBUMIN: 4.3 g/dL (ref 3.9–5.0)
BLOOD UREA NITROGEN: 14 mg/dL (ref 6–20)
BUN / CREAT RATIO: 18 (ref 9–23)
CALCIUM: 10 mg/dL (ref 8.7–10.2)
CHLORIDE: 108 mmol/L — ABNORMAL HIGH (ref 96–106)
CO2: 19 mmol/L — ABNORMAL LOW (ref 20–29)
CREATININE: 0.8 mg/dL (ref 0.57–1.00)
GFR MDRD NON AF AMER: 99 mL/min/{1.73_m2}
PHOSPHORUS, SERUM: 3.9 mg/dL (ref 3.0–4.3)
POTASSIUM: 5 mmol/L (ref 3.5–5.2)
SODIUM: 140 mmol/L (ref 134–144)

## 2019-08-01 LAB — PROTEIN / CREATININE RATIO, URINE
CREATININE URINE: 77.8 mg/dL
PROTEIN/CREAT RATIO: 297 mg/g{creat} — ABNORMAL HIGH (ref 0–200)

## 2019-08-01 LAB — PROTEIN URINE: Protein:MCnc:Pt:Urine:Qn:: 23.1

## 2019-08-01 LAB — MAGNESIUM: Magnesium:MCnc:Pt:Ser/Plas:Qn:: 1.7

## 2019-08-01 LAB — BASOPHILS RELATIVE PERCENT: Basophils/100 leukocytes:NFr:Pt:Bld:Qn:Automated count: 1

## 2019-08-02 LAB — TACROLIMUS BLOOD: Tacrolimus:MCnc:Pt:Bld:Qn:LC/MS/MS: 5.5

## 2019-08-03 DIAGNOSIS — D849 Immunodeficiency, unspecified: Principal | ICD-10-CM

## 2019-08-03 DIAGNOSIS — Z940 Kidney transplant status: Principal | ICD-10-CM

## 2019-08-03 MED FILL — ENVARSUS XR 4 MG TABLET,EXTENDED RELEASE: ORAL | 30 days supply | Qty: 60 | Fill #2

## 2019-08-03 MED FILL — MYCOPHENOLATE SODIUM 180 MG TABLET,DELAYED RELEASE: ORAL | 30 days supply | Qty: 180 | Fill #4

## 2019-08-03 MED FILL — MYCOPHENOLATE SODIUM 180 MG TABLET,DELAYED RELEASE: 30 days supply | Qty: 180 | Fill #4 | Status: AC

## 2019-08-03 MED FILL — ENVARSUS XR 4 MG TABLET,EXTENDED RELEASE: 30 days supply | Qty: 60 | Fill #2 | Status: AC

## 2019-08-03 MED FILL — MG-PLUS-PROTEIN 133 MG TABLET: 30 days supply | Qty: 120 | Fill #4 | Status: AC

## 2019-08-03 MED FILL — MG-PLUS-PROTEIN 133 MG TABLET: ORAL | 30 days supply | Qty: 120 | Fill #4

## 2019-08-04 LAB — RENAL FUNCTION PANEL
BLOOD UREA NITROGEN: 17 mg/dL (ref 6–20)
BUN / CREAT RATIO: 20 (ref 9–23)
CALCIUM: 10 mg/dL (ref 8.7–10.2)
CHLORIDE: 104 mmol/L (ref 96–106)
CO2: 20 mmol/L (ref 20–29)
CREATININE: 0.87 mg/dL (ref 0.57–1.00)
GFR MDRD AF AMER: 103 mL/min/{1.73_m2}
GFR MDRD NON AF AMER: 90 mL/min/{1.73_m2}
GLUCOSE: 94 mg/dL (ref 65–99)
POTASSIUM: 4.8 mmol/L (ref 3.5–5.2)
SODIUM: 139 mmol/L (ref 134–144)

## 2019-08-04 LAB — CBC W/ DIFFERENTIAL
BANDED NEUTROPHILS ABSOLUTE COUNT: 0.1 10*3/uL (ref 0.0–0.1)
EOSINOPHILS ABSOLUTE COUNT: 0.2 10*3/uL (ref 0.0–0.4)
EOSINOPHILS RELATIVE PERCENT: 6 %
HEMATOCRIT: 33 % — ABNORMAL LOW (ref 34.0–46.6)
HEMOGLOBIN: 11 g/dL — ABNORMAL LOW (ref 11.1–15.9)
IMMATURE GRANULOCYTES: 2 %
LYMPHOCYTES ABSOLUTE COUNT: 0.4 10*3/uL — ABNORMAL LOW (ref 0.7–3.1)
LYMPHOCYTES RELATIVE PERCENT: 14 %
MEAN CORPUSCULAR HEMOGLOBIN CONC: 33.3 g/dL (ref 31.5–35.7)
MEAN CORPUSCULAR HEMOGLOBIN: 30.6 pg (ref 26.6–33.0)
MEAN CORPUSCULAR VOLUME: 92 fL (ref 79–97)
MONOCYTES ABSOLUTE COUNT: 0.5 10*3/uL (ref 0.1–0.9)
MONOCYTES RELATIVE PERCENT: 17 %
NEUTROPHILS ABSOLUTE COUNT: 1.9 10*3/uL (ref 1.4–7.0)
NEUTROPHILS RELATIVE PERCENT: 60 %
PLATELET COUNT: 229 10*3/uL (ref 150–450)
RED BLOOD CELL COUNT: 3.59 x10E6/uL — ABNORMAL LOW (ref 3.77–5.28)
RED CELL DISTRIBUTION WIDTH: 11.8 % (ref 11.7–15.4)
WHITE BLOOD CELL COUNT: 3.2 10*3/uL — ABNORMAL LOW (ref 3.4–10.8)

## 2019-08-04 LAB — LYMPHOCYTES ABSOLUTE COUNT: Lymphocytes:NCnc:Pt:Bld:Qn:Automated count: 0.4 — ABNORMAL LOW

## 2019-08-04 LAB — MAGNESIUM: Magnesium:MCnc:Pt:Ser/Plas:Qn:: 1.8

## 2019-08-04 LAB — BUN / CREAT RATIO: Urea nitrogen/Creatinine:MRto:Pt:Ser/Plas:Qn:: 20

## 2019-08-05 LAB — TACROLIMUS BLOOD: Tacrolimus:MCnc:Pt:Bld:Qn:LC/MS/MS: 5.9

## 2019-08-07 DIAGNOSIS — Z940 Kidney transplant status: Principal | ICD-10-CM

## 2019-08-07 DIAGNOSIS — D849 Immunodeficiency, unspecified: Principal | ICD-10-CM

## 2019-08-08 DIAGNOSIS — D849 Immunodeficiency, unspecified: Principal | ICD-10-CM

## 2019-08-08 DIAGNOSIS — Z940 Kidney transplant status: Principal | ICD-10-CM

## 2019-08-08 LAB — RENAL FUNCTION PANEL
BLOOD UREA NITROGEN: 14 mg/dL (ref 6–20)
BUN / CREAT RATIO: 15 (ref 9–23)
CALCIUM: 9.6 mg/dL (ref 8.7–10.2)
CHLORIDE: 105 mmol/L (ref 96–106)
CO2: 19 mmol/L — ABNORMAL LOW (ref 20–29)
CREATININE: 0.92 mg/dL (ref 0.57–1.00)
GFR MDRD NON AF AMER: 84 mL/min/{1.73_m2}
GLUCOSE: 89 mg/dL (ref 65–99)
PHOSPHORUS, SERUM: 3.6 mg/dL (ref 3.0–4.3)
POTASSIUM: 4.6 mmol/L (ref 3.5–5.2)
SODIUM: 136 mmol/L (ref 134–144)

## 2019-08-08 LAB — CBC W/ DIFFERENTIAL
BANDED NEUTROPHILS ABSOLUTE COUNT: 0.1 10*3/uL (ref 0.0–0.1)
BASOPHILS RELATIVE PERCENT: 1 %
EOSINOPHILS ABSOLUTE COUNT: 0.2 10*3/uL (ref 0.0–0.4)
EOSINOPHILS RELATIVE PERCENT: 4 %
HEMATOCRIT: 31.2 % — ABNORMAL LOW (ref 34.0–46.6)
HEMOGLOBIN: 10.5 g/dL — ABNORMAL LOW (ref 11.1–15.9)
IMMATURE GRANULOCYTES: 1 %
LYMPHOCYTES ABSOLUTE COUNT: 0.4 10*3/uL — ABNORMAL LOW (ref 0.7–3.1)
LYMPHOCYTES RELATIVE PERCENT: 12 %
MEAN CORPUSCULAR HEMOGLOBIN CONC: 33.7 g/dL (ref 31.5–35.7)
MEAN CORPUSCULAR HEMOGLOBIN: 30.9 pg (ref 26.6–33.0)
MEAN CORPUSCULAR VOLUME: 92 fL (ref 79–97)
MONOCYTES ABSOLUTE COUNT: 0.5 10*3/uL (ref 0.1–0.9)
NEUTROPHILS ABSOLUTE COUNT: 2.7 10*3/uL (ref 1.4–7.0)
NEUTROPHILS RELATIVE PERCENT: 69 %
PLATELET COUNT: 193 10*3/uL (ref 150–450)
RED BLOOD CELL COUNT: 3.4 x10E6/uL — ABNORMAL LOW (ref 3.77–5.28)
RED CELL DISTRIBUTION WIDTH: 11.9 % (ref 11.7–15.4)
WHITE BLOOD CELL COUNT: 3.8 10*3/uL (ref 3.4–10.8)

## 2019-08-08 LAB — NEUTROPHILS ABSOLUTE COUNT: Neutrophils:NCnc:Pt:Bld:Qn:Automated count: 2.7

## 2019-08-08 LAB — CHLORIDE: Chloride:SCnc:Pt:Ser/Plas:Qn:: 105

## 2019-08-08 LAB — TACROLIMUS BLOOD: Tacrolimus:MCnc:Pt:Bld:Qn:LC/MS/MS: 9.1

## 2019-08-08 LAB — MAGNESIUM: Magnesium:MCnc:Pt:Ser/Plas:Qn:: 1.6

## 2019-08-10 DIAGNOSIS — D849 Immunodeficiency, unspecified: Principal | ICD-10-CM

## 2019-08-10 DIAGNOSIS — Z940 Kidney transplant status: Principal | ICD-10-CM

## 2019-08-11 LAB — RENAL FUNCTION PANEL
ALBUMIN: 4.1 g/dL (ref 3.9–5.0)
BLOOD UREA NITROGEN: 12 mg/dL (ref 6–20)
CHLORIDE: 102 mmol/L (ref 96–106)
CO2: 19 mmol/L — ABNORMAL LOW (ref 20–29)
GFR MDRD AF AMER: 99 mL/min/{1.73_m2}
GFR MDRD NON AF AMER: 86 mL/min/{1.73_m2}
GLUCOSE: 89 mg/dL (ref 65–99)
PHOSPHORUS, SERUM: 3.6 mg/dL (ref 3.0–4.3)
POTASSIUM: 5 mmol/L (ref 3.5–5.2)
SODIUM: 136 mmol/L (ref 134–144)

## 2019-08-11 LAB — CBC W/ DIFFERENTIAL
BASOPHILS ABSOLUTE COUNT: 0 10*3/uL (ref 0.0–0.2)
BASOPHILS RELATIVE PERCENT: 1 %
EOSINOPHILS ABSOLUTE COUNT: 0.2 10*3/uL (ref 0.0–0.4)
EOSINOPHILS RELATIVE PERCENT: 5 %
HEMATOCRIT: 32.8 % — ABNORMAL LOW (ref 34.0–46.6)
HEMOGLOBIN: 10.7 g/dL — ABNORMAL LOW (ref 11.1–15.9)
IMMATURE GRANULOCYTES: 1 %
LYMPHOCYTES ABSOLUTE COUNT: 0.5 10*3/uL — ABNORMAL LOW (ref 0.7–3.1)
MEAN CORPUSCULAR HEMOGLOBIN CONC: 32.6 g/dL (ref 31.5–35.7)
MEAN CORPUSCULAR HEMOGLOBIN: 30 pg (ref 26.6–33.0)
MEAN CORPUSCULAR VOLUME: 92 fL (ref 79–97)
MONOCYTES ABSOLUTE COUNT: 0.5 10*3/uL (ref 0.1–0.9)
MONOCYTES RELATIVE PERCENT: 14 %
NEUTROPHILS ABSOLUTE COUNT: 2.2 10*3/uL (ref 1.4–7.0)
NEUTROPHILS RELATIVE PERCENT: 64 %
PLATELET COUNT: 224 10*3/uL (ref 150–450)
RED BLOOD CELL COUNT: 3.57 x10E6/uL — ABNORMAL LOW (ref 3.77–5.28)
RED CELL DISTRIBUTION WIDTH: 12.1 % (ref 11.7–15.4)
WHITE BLOOD CELL COUNT: 3.3 10*3/uL — ABNORMAL LOW (ref 3.4–10.8)

## 2019-08-11 LAB — SODIUM: Sodium:SCnc:Pt:Ser/Plas:Qn:: 136

## 2019-08-11 LAB — MEAN CORPUSCULAR VOLUME: Erythrocyte mean corpuscular volume:EntVol:Pt:RBC:Qn:Automated count: 92

## 2019-08-11 LAB — MAGNESIUM
MAGNESIUM: 1.6 mg/dL (ref 1.6–2.3)
Magnesium:MCnc:Pt:Ser/Plas:Qn:: 1.6

## 2019-08-12 LAB — TACROLIMUS BLOOD: Tacrolimus:MCnc:Pt:Bld:Qn:LC/MS/MS: 6.9

## 2019-08-14 DIAGNOSIS — Z940 Kidney transplant status: Principal | ICD-10-CM

## 2019-08-14 DIAGNOSIS — D849 Immunodeficiency, unspecified: Principal | ICD-10-CM

## 2019-08-14 MED FILL — CLONAZEPAM 0.5 MG TABLET: 30 days supply | Qty: 60 | Fill #4 | Status: AC

## 2019-08-14 MED FILL — CLONAZEPAM 0.5 MG TABLET: ORAL | 30 days supply | Qty: 60 | Fill #4

## 2019-08-15 DIAGNOSIS — Z940 Kidney transplant status: Principal | ICD-10-CM

## 2019-08-15 DIAGNOSIS — D849 Immunodeficiency, unspecified: Principal | ICD-10-CM

## 2019-08-15 LAB — RENAL FUNCTION PANEL
ALBUMIN: 4.4 g/dL (ref 3.9–5.0)
BLOOD UREA NITROGEN: 32 mg/dL — ABNORMAL HIGH (ref 6–20)
CALCIUM: 9.8 mg/dL (ref 8.7–10.2)
CO2: 16 mmol/L — ABNORMAL LOW (ref 20–29)
CREATININE: 1.03 mg/dL — ABNORMAL HIGH (ref 0.57–1.00)
GFR MDRD AF AMER: 84 mL/min/{1.73_m2}
GFR MDRD NON AF AMER: 73 mL/min/{1.73_m2}
GLUCOSE: 89 mg/dL (ref 65–99)
PHOSPHORUS, SERUM: 4.8 mg/dL — ABNORMAL HIGH (ref 3.0–4.3)
POTASSIUM: 5 mmol/L (ref 3.5–5.2)
SODIUM: 138 mmol/L (ref 134–144)

## 2019-08-15 LAB — CBC W/ DIFFERENTIAL
BANDED NEUTROPHILS ABSOLUTE COUNT: 0.1 10*3/uL (ref 0.0–0.1)
BASOPHILS RELATIVE PERCENT: 1 %
EOSINOPHILS ABSOLUTE COUNT: 0.2 10*3/uL (ref 0.0–0.4)
EOSINOPHILS RELATIVE PERCENT: 4 %
HEMATOCRIT: 33.8 % — ABNORMAL LOW (ref 34.0–46.6)
HEMOGLOBIN: 11.2 g/dL (ref 11.1–15.9)
IMMATURE GRANULOCYTES: 2 %
LYMPHOCYTES ABSOLUTE COUNT: 0.5 10*3/uL — ABNORMAL LOW (ref 0.7–3.1)
MEAN CORPUSCULAR HEMOGLOBIN CONC: 33.1 g/dL (ref 31.5–35.7)
MEAN CORPUSCULAR HEMOGLOBIN: 31.2 pg (ref 26.6–33.0)
MEAN CORPUSCULAR VOLUME: 94 fL (ref 79–97)
MONOCYTES ABSOLUTE COUNT: 0.6 10*3/uL (ref 0.1–0.9)
MONOCYTES RELATIVE PERCENT: 14 %
PLATELET COUNT: 219 10*3/uL (ref 150–450)
RED BLOOD CELL COUNT: 3.59 x10E6/uL — ABNORMAL LOW (ref 3.77–5.28)
RED CELL DISTRIBUTION WIDTH: 12.2 % (ref 11.7–15.4)
WHITE BLOOD CELL COUNT: 4.1 10*3/uL (ref 3.4–10.8)

## 2019-08-15 LAB — MEAN CORPUSCULAR HEMOGLOBIN CONC: Erythrocyte mean corpuscular hemoglobin concentration:MCnc:Pt:RBC:Qn:Automated count: 33.1

## 2019-08-15 LAB — MAGNESIUM: Magnesium:MCnc:Pt:Ser/Plas:Qn:: 1.9

## 2019-08-15 LAB — BUN / CREAT RATIO: Urea nitrogen/Creatinine:MRto:Pt:Ser/Plas:Qn:: 31 — ABNORMAL HIGH

## 2019-08-16 ENCOUNTER — Encounter: Admit: 2019-08-16 | Discharge: 2019-08-16 | Payer: PRIVATE HEALTH INSURANCE

## 2019-08-16 ENCOUNTER — Ambulatory Visit
Admit: 2019-08-16 | Discharge: 2019-08-16 | Payer: PRIVATE HEALTH INSURANCE | Attending: Nephrology | Primary: Nephrology

## 2019-08-16 DIAGNOSIS — D849 Immunodeficiency, unspecified: Principal | ICD-10-CM

## 2019-08-16 DIAGNOSIS — Z940 Kidney transplant status: Principal | ICD-10-CM

## 2019-08-16 DIAGNOSIS — N058 Unspecified nephritic syndrome with other morphologic changes: Principal | ICD-10-CM

## 2019-08-16 LAB — CBC W/ AUTO DIFF
BASOPHILS ABSOLUTE COUNT: 0 10*9/L (ref 0.0–0.1)
EOSINOPHILS ABSOLUTE COUNT: 0.1 10*9/L (ref 0.0–0.7)
EOSINOPHILS RELATIVE PERCENT: 3.3 %
HEMATOCRIT: 36.6 % (ref 35.0–44.0)
HEMOGLOBIN: 11.7 g/dL — ABNORMAL LOW (ref 12.0–15.5)
LYMPHOCYTES ABSOLUTE COUNT: 0.5 10*9/L — ABNORMAL LOW (ref 0.7–4.0)
LYMPHOCYTES RELATIVE PERCENT: 11.2 %
MEAN CORPUSCULAR HEMOGLOBIN CONC: 31.9 g/dL (ref 30.0–36.0)
MEAN CORPUSCULAR HEMOGLOBIN: 29.7 pg (ref 26.0–34.0)
MEAN CORPUSCULAR VOLUME: 92.9 fL (ref 82.0–98.0)
MEAN PLATELET VOLUME: 7.7 fL (ref 7.0–10.0)
MONOCYTES ABSOLUTE COUNT: 0.4 10*9/L (ref 0.1–1.0)
MONOCYTES RELATIVE PERCENT: 9.5 %
NEUTROPHILS ABSOLUTE COUNT: 3.2 10*9/L (ref 1.7–7.7)
NEUTROPHILS RELATIVE PERCENT: 75.6 %
PLATELET COUNT: 252 10*9/L (ref 150–450)
RED CELL DISTRIBUTION WIDTH: 13.1 % (ref 12.0–15.0)
WBC ADJUSTED: 4.3 10*9/L (ref 3.5–10.5)

## 2019-08-16 LAB — URINALYSIS
BILIRUBIN UA: NEGATIVE
GLUCOSE UA: NEGATIVE
GRANULAR CASTS: 1 /LPF — ABNORMAL HIGH
KETONES UA: NEGATIVE
LEUKOCYTE ESTERASE UA: NEGATIVE
NITRITE UA: NEGATIVE
PH UA: 5.5 (ref 5.0–9.0)
PROTEIN UA: 30 — AB
RBC UA: <1 /HPF (ref <4)
SPECIFIC GRAVITY UA: 1.025 (ref 1.005–1.030)
SQUAMOUS EPITHELIAL: 1 /HPF (ref 0–5)
UROBILINOGEN UA: 0.2
WBC UA: <1 /HPF (ref 0–5)

## 2019-08-16 LAB — HEPATIC FUNCTION PANEL
ALBUMIN: 4 g/dL (ref 3.4–5.0)
ALT (SGPT): 19 U/L (ref 10–49)
AST (SGOT): 30 U/L (ref <34)
BILIRUBIN TOTAL: 0.3 mg/dL (ref 0.3–1.2)
PROTEIN TOTAL: 7.5 g/dL (ref 5.7–8.2)

## 2019-08-16 LAB — BASIC METABOLIC PANEL
ANION GAP: 9 mmol/L (ref 3–11)
BLOOD UREA NITROGEN: 32 mg/dL — ABNORMAL HIGH (ref 9–23)
CALCIUM: 10.2 mg/dL (ref 8.7–10.4)
CHLORIDE: 107 mmol/L (ref 98–107)
CO2: 18.1 mmol/L — ABNORMAL LOW (ref 20.0–31.0)
CREATININE: 1 mg/dL (ref 0.60–1.10)
EGFR CKD-EPI AA FEMALE: 87 mL/min/{1.73_m2}
EGFR CKD-EPI NON-AA FEMALE: 76 mL/min/{1.73_m2}
GLUCOSE RANDOM: 87 mg/dL (ref 70–179)
POTASSIUM: 4.7 mmol/L (ref 3.5–5.1)
SODIUM: 134 mmol/L — ABNORMAL LOW (ref 135–145)

## 2019-08-16 LAB — MAGNESIUM
MAGNESIUM: 2 mg/dL (ref 1.6–2.6)
Magnesium:MCnc:Pt:Ser/Plas:Qn:: 2

## 2019-08-16 LAB — BILIRUBIN TOTAL: Bilirubin:MCnc:Pt:Ser/Plas:Qn:: 0.3

## 2019-08-16 LAB — SQUAMOUS EPITHELIAL: Lab: 1

## 2019-08-16 LAB — C4 COMPLEMENT: Complement C4:MCnc:Pt:Ser/Plas:Qn:: 30

## 2019-08-16 LAB — PHOSPHORUS: Phosphate:MCnc:Pt:Ser/Plas:Qn:: 4.3

## 2019-08-16 LAB — EOSINOPHILS ABSOLUTE COUNT: Eosinophils:NCnc:Pt:Bld:Qn:Automated count: 0.1

## 2019-08-16 LAB — TACROLIMUS BLOOD: Tacrolimus:MCnc:Pt:Bld:Qn:LC/MS/MS: 5.5

## 2019-08-16 LAB — PROTEIN/CREAT RATIO, URINE: Protein/Creatinine:MRto:Pt:Urine:Qn:: 0.43

## 2019-08-16 LAB — CHLORIDE: Chloride:SCnc:Pt:Ser/Plas:Qn:: 107

## 2019-08-16 LAB — PROTEIN / CREATININE RATIO, URINE: PROTEIN URINE: 33.9 mg/dL

## 2019-08-16 LAB — C3 COMPLEMENT: Complement C3:MCnc:Pt:Ser/Plas:Qn:: 76 — ABNORMAL LOW

## 2019-08-16 MED ORDER — SODIUM BICARBONATE 650 MG TABLET: 650 mg | tablet | Freq: Two times a day (BID) | 11 refills | 30 days | Status: AC

## 2019-08-16 MED ORDER — SODIUM BICARBONATE 650 MG TABLET
ORAL_TABLET | Freq: Two times a day (BID) | ORAL | 11 refills | 30.00000 days | Status: CP
Start: 2019-08-16 — End: 2020-08-15
  Filled 2019-08-23: qty 180, 90d supply, fill #0

## 2019-08-16 NOTE — Unmapped (Signed)
[facility] CLINIC PHARMACY NOTE  08/16/2019   Carolyn Kelley  [id number]    Medication changes today:   1. Start bicarb 650 mg BID    Education/Adherence tools provided today:  1.provided updated medication list  2.  provided additional education on immunosuppression and transplant related medications including reviewing indications of medications, dosing and side effects    Follow up items:  1. goal of understanding indications and dosing of immunosuppression medications   2. CO2 level    Next visit with pharmacy in 1-3 months  ____________________________________________________________________    Carolyn Kelley is a 29 y.o. 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     CC:  Patient complains of  some headaches that are managed by APAP    There were no vitals filed for this visit.    Allergies   Allergen Reactions   ??? Bee Pollen Swelling     Sneezing, congestion  denies swelling.  Sneezing, congestion  denies swelling.     ??? Pollen Extracts Swelling       Medications reviewed in EPIC medication station and updated today by the clinical pharmacist practitioner.    Outpatient Encounter Medications as of 08/16/2019   Medication Sig Dispense Refill   ??? acetaminophen (TYLENOL) 500 MG tablet Take 1-2 tablets (500-1,000 mg total) by mouth every six (6) hours as needed for pain or fever (> 38C). 100 tablet 0   ??? aspirin (ECOTRIN) 81 MG tablet Take 1 tablet (81 mg total) by mouth daily. 30 tablet 11   ??? cholecalciferol, vitamin D3-125 mcg, 5,000 unit,, 125 mcg (5,000 unit) capsule Take 1 capsule (125 mcg total) by mouth daily. 100 capsule 11   ??? clonazePAM (KLONOPIN) 0.5 MG tablet Take 1 tablet (0.5 mg total) by mouth Two (2) times a day. 60 tablet 5   ??? levETIRAcetam (KEPPRA) 250 MG tablet TAKE 1 TABLET BY MOUTH TWICE DAILY. 180 tablet 3   ??? magnesium oxide-Mg AA chelate (MAGNESIUM, AMINO ACID CHELATE,) 133 mg Tab Take 2 tablets by mouth Two (2) times a day. 120 tablet 11   ??? mycophenolate (MYFORTIC) 180 MG EC tablet Take 3 tablets (540 mg total) by mouth Two (2) times a day. Adjust dose per medication card. 180 tablet 11   ??? sulfamethoxazole-trimethoprim (BACTRIM) 400-80 mg per tablet Take 1 tablet (80 mg of trimethoprim total) by mouth Every Monday, Wednesday, and Friday. 12 tablet 5   ??? tacrolimus (ENVARSUS XR) 4 mg Tb24 extended release tablet Take 2 tablets (8 mg total) by mouth daily. 60 tablet 11   ??? [DISCONTINUED] chlorhexidine (PERIDEX) 0.12 % solution 15 mL by Mouth route Two (2) times a day.       No facility-administered encounter medications on file as of 08/16/2019.       Induction agent: alemtuzumab    CURRENT IMMUNOSUPPRESSION: Envarsus 8 mg PO qd  prograf/Envarsus/cyclosporine goal: 6-8   myfortic540 mg PO bid    steroid free     Patient is tolerating immunosuppression well. Already had tremors pre transplant. States tremors are improving.    IMMUNOSUPPRESSION DRUG LEVELS:  Lab Results   Component Value Date    Tacrolimus, Trough 4.1 (L) 06/30/2019    Tacrolimus, Trough 9.4 05/16/2019  Tacrolimus, Trough 6.1 04/18/2019    Tacrolimus Lvl 6.9 08/10/2019    Tacrolimus Lvl 9.1 08/07/2019    Tacrolimus Lvl 5.9 08/03/2019     No results found for: CYCLO  No results found for: EVEROLIMUS  No results found for: SIROLIMUS    Envarsus level is accurate 24 hour trough, takes medication at 11am    Graft function: stable  DSA: ntd  Zero hour biopsy: without diagnostic abnormalities  Biopsies to date: ntd   UPC: 0.055 04/18/2019  WBC/ANC: wnl    Plan: Will maintain current immunosuppression. Continue to monitor closely.    OI Prophylaxis:   CMV Status: D+/ R+, moderate risk . CMV prophylaxis: valganciclovir 450 mg daily x 3 months per protocol complete.  Lab Results   Component Value Date    CMV Quant Positive < 200 07/24/2019    CMV Quant 224 (H) 05/16/2019      Estimated Creatinine Clearance: 72.2 mL/min (A) (based on SCr of 1.03 mg/dL (H)).  PCP Prophylaxis: bactrim SS 1 tab MWF x 6 months. End date: September 08 2019  Thrush: completed in hospital  Patient is tolerating infectious prophylaxis well    Plan: Continue per protocol. Continue to monitor.    CV Prophylaxis: asa 81 mg   The ASCVD Risk score Denman George DC Montez Hageman, et al., 2013) failed to calculate.  Statin therapy: Not indicated  Plan:  Continue to monitor     BP: Goal < 140/90. Clinic vitals reported above  Home BP ranges: 100-110s/80s  Current meds include: none  Plan: within goal. Continue to monitor    Anemia of CKD:  H/H:   Lab Results   Component Value Date    HGB 11.2 08/14/2019     Lab Results   Component Value Date    HCT 33.8 (L) 08/14/2019     Iron panel:  Lab Results   Component Value Date    IRON 55 12/23/2015    TIBC 213.1 (L) 12/23/2015    FERRITIN 588.0 (H) 12/23/2015     Lab Results   Component Value Date    Iron Saturation (%) 26 12/23/2015    Iron Saturation (%) 26 04/29/2011     Prior ESA use: none post transplant  Plan: stable. Continue to monitor.     DM:   Lab Results   Component Value Date    A1C 4.6 (L) 03/09/2019   Goal A1c < 7  FBG: mostly in 80s  History of Dm? No  Plan:  Continue to monitor    Fluid Intake: 64 oz daily  Plan: Continue adequate fluid intake    Electrolytes: Mg wnl, CO 16 on 5/24 labs  Meds currently on: Mg plus protein 266 mg BID  Plan: Start sodium bicarb 650 mg BID    GI/BM: pt reports normal BM and no GERD symptoms daily (previously having diarrhea that is now resolved).  Meds currently on: none  Plan: Continue to monitor     Pain: pt reports no pain, some headaches (occur a few times a week)  Meds currently on: APAP PRN (uses 2 tabs for HAs)  Plan: Continue to monitor.    Bone health:   Vitamin D Level: last level 19.9 on 4/9. Goal > 30.   Last DEXA results: none available  Current meds include: cholecalciferol 5000 units daily (completed 12 weeks of ergocalciferol)  Plan: Vitamin D level  out of goal. Continue supplementation. Continue to monitor.     Women's/Men's Health:  Carolyn Kelley is  a 29 y.o. Female of childbearing age. Patient reports no men's/women's health issues.   Plan: Continue to monitor. Counseled patient on the importance of using protection and birth control. Of note, mom was in the room. Has previously reported she is not sexually active. Told me today she is not interested in birth control at this time. Coordinator will follow-up on the phone to have a more private conversation with patient.     Seizure history - last seizure while hospitalized in 2014  Meds currently on: Keppra 250 mg BID, clonazepam 0.5 mg BID  Plan: continue to monitor    Pharmacy preference:  [facility]    Adherence: Patient has good understanding of medications; was able to independently identify names/doses of immunosuppressants and OI meds.  Patient does fill their own pill box on a regular basis at home.  Patient brought medication card:no  Pill box:did not bring   Plan: provided moderate adherence counseling/intervention. Counseled patient on bringing pillbox and medication card to every visit.    I spent a total of 15 minutes face to face with the patient delivering clinical care and providing education/counseling.    Patient was reviewed with Dr. Margaretmary Bayley who was agreement with the stated plan:     During this visit, the following was completed:   BP log data assessment  Labs ordered and evaluated  complex treatment plan >1 DS     All questions/concerns were addressed to the patient's satisfaction.  __________________________________________  Carole Civil, PharmD    Cleone Slim, PHARMD, CPP  [employer] CLINICAL PHARMACIST PRACTITIONER  PAGER [phone]

## 2019-08-16 NOTE — Unmapped (Signed)
AOBP:right arm   Medium cuff   Average:108/76 Pulse: 93  1st reading:108/77 Pulse: 99  2nd reading:110/75 Pulse:87  3rd reading:107/75 Pulse:93

## 2019-08-16 NOTE — Unmapped (Signed)
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  Active medical issues include:    S/P deceased donor kidney transplant with new onset proteinuria  Serum creatinine is 1.0 mg/dL (baseline <0.9 mg/dL). The UP/C elevation of 2.644 on 06/30/19 was likely a lab error as it was normal 3 days later and today is 0.430.  She has no history of rejection or DSAs (last DSA screen 04/18/19).     Immunosuppression Management [High Risk Medical Decision Making For Drug Therapy Requiring Intensive Monitoring For Toxicity]:   Tacrolimus is just below target at 5.5  (target 6-10). Will continue current dose of Envarsus 8 mg daily and Myfortic 540 mg bid.    Infectious Prophylaxis and Monitoring: CMV & EBV: D+/R+  The patient will complete Bactrim x 6 months for prophylaxis. COVID-19 vaccination will be scheduled.    Seizures Disorder, no seizures since 2014:    Will continue Keppra.  She will follow up with Carolyn Ege, NP at [facility].  Last visit on 05/23/2018, with yearly follow ups.     Myoclonus:   Will continue Klonopin.  Will follow with [facility], continue to use walker and cane. Last see on 10/19/18. F/U 1 year.    Metabolic Acidosis and Hyperkalemia  Will start sodium bicarbonate 650 mg bid today.    Pseudoaneurysm of AVF  Will refer to Carolyn Kelley.    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 Colposcopy 12/21/18 with LSILShe is due for repeat.   Discussed need for contraception and pregnancy prevention due to medications.   Immunizations: Flu Shot: 12/15/18; Prevnar 13: 02/23/19; Pneumovax: 02/04/12, COVID-19 vaccine completed 07/31/18. She is aware of limited efficacy of the vaccine.     History of Present Illness    She is a 29 y.o. 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 Her post-operative course was complicated by hypotension and hypoxia thought to be caused by anaphylactic reaction to Campath. She has experienced no episodes of rejection, evidence of disease recurrence, or infectious complications.     Since her last visit she saw the dentist about pain in her right mouth and it was determined to be wisdom tooth pain. She denies chest pain, shortness of breath, edema, fever, chills, dysuria, or allograft tenderness. Nausea and diarrhea have resolved.  She denies seizures. Myoclonus is improved. She denies hand tremors. She is having headache approximately every week.  She also complains of presence of a pseudoaneurysm in her left arm AVF. She states they were planning surgery on this prior to her transplant.     At her last clinic visit she was found to have a UP/C of 2.3.  Repeat testing 3 days later was normal.     Transplant History:  Date of Transplant: 03/10/19  Organ Received: Left DDKT, DBD, SCD, KDPI 2%; cold ischemia 18 hrs.  Native Kidney Disease: C3 glomerulonephropathy  Pre-transplant CPRA:  0%  Post-Transplant Course: Complicated by anaphylactic reaction to Campath in OR  Induction: Campath  Date of Ureteral Stent Removal: 04/17/19  CMV/EBV Status: CMV D+/R+, EBV D+/R+  Rejection Episodes: None  Donor Specific Antibodies: Negative (most recent 04/18/19)     Results of Renal Imaging (pre and post):     Pre-Txp 01/18/17  Right interpolar cyst 1.4 x 1.3 x 1.1 cm (previously 0.9  x 0.9 x 0.9 cm), second, smaller cyst in the interpolar region measures 1.1 x 1.1 x 1.1 cm, left interpolar region cystic lesion with tiny peripheral echogenic focus measuring 0.7 x 0.8 x 0.7 cm. Multiple other small subcentimeter hypodensities are seen, too small to characterize. No hydronephrosis or renal calculi.     CT Renal Mass Protocol 02/25/2018  Bilateral atrophic kidneys with multiple simple cysts. No enhancing soft tissue renal mass. No hydronephrosis or calculi.    Post-Txp 03/10/19 (txp kidney only)  Normal size and echogenicity.  No solid masses or calculi. No perinephric collections identified. No hydronephrosis    Other Past Medical History  1. C3 Glomerulopathy  2. Seizure Disorder  3. 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 HTN  5. 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    Medications  Current Outpatient Medications   Medication Sig Dispense Refill   ??? acetaminophen (TYLENOL) 500 MG tablet Take 1-2 tablets (500-1,000 mg total) by mouth every six (6) hours as needed for pain or fever (> 38C). 100 tablet 0   ??? aspirin (ECOTRIN) 81 MG tablet Take 1 tablet (81 mg total) by mouth daily. 30 tablet 11   ??? chlorhexidine (PERIDEX) 0.12 % solution 15 mL by Mouth route Two (2) times a day.     ??? cholecalciferol, vitamin D3-125 mcg, 5,000 unit,, 125 mcg (5,000 unit) capsule Take 1 capsule (125 mcg total) by mouth daily. 100 capsule 11   ??? clonazePAM (KLONOPIN) 0.5 MG tablet Take 1 tablet (0.5 mg total) by mouth Two (2) times a day. 60 tablet 5   ??? levETIRAcetam (KEPPRA) 250 MG tablet TAKE 1 TABLET BY MOUTH TWICE DAILY. 180 tablet 3   ??? magnesium oxide-Mg AA chelate (MAGNESIUM, AMINO ACID CHELATE,) 133 mg Tab Take 2 tablets by mouth Two (2) times a day. 120 tablet 11   ??? mycophenolate (MYFORTIC) 180 MG EC tablet Take 3 tablets (540 mg total) by mouth Two (2) times a day. Adjust dose per medication card. 180 tablet 11   ??? sulfamethoxazole-trimethoprim (BACTRIM) 400-80 mg per tablet Take 1 tablet (80 mg of trimethoprim total) by mouth Every Monday, Wednesday, and Friday. 12 tablet 5   ??? tacrolimus (ENVARSUS XR) 4 mg Tb24 extended release tablet Take 2 tablets (8 mg total) by mouth daily. 60 tablet 11     No current facility-administered medications for this visit.     Physical Exam  BP 108/76 (BP Site: R Arm, BP Position: Sitting, BP Cuff Size: Medium)  - Pulse 93  - Temp 36.4 ??C (97.6 ??F) (Temporal)  - Ht 157.5 cm (5' 2.01)  - Wt 68 kg (150 lb)  - LMP 07/17/2019  - BMI 27.43 kg/m??   General: no acute distress  HEENT: anicteric  Neck: neck supple, no adenopathy  CV: normal rate, normal rhythm, no murmur, no gallops, no rubs appreciated  Lungs: clear to auscultation bilaterally  Abdomen: soft, non tender, incision well healed.  Extremities:  no edema, multiple pseudoaneurysms on AVF left upper arm.  Neurologic: Patient uses a walker for ambulation due to myoclonus.     Recent Results (from the past 170 hour(s))   CBC w/ Differential    Collection Time: 08/10/19 10:35 AM   Result Value Ref Range    WBC 3.3 (L) 3.4 - 10.8 x10E3/uL    RBC 3.57 (L)  3.77 - 5.28 x10E6/uL    HGB 10.7 (L) 11.1 - 15.9 g/dL    HCT 16.1 (L) 09.6 - 46.6 %    MCV 92 79.0 - 97.0 fL    MCH 30.0 26.6 - 33.0 pg    MCHC 32.6 31.5 - 35.7 g/dL    RDW 04.5 40.9 - 81.1 %    Platelet 224 150 - 450 x10E3/uL    Neutrophils % 64 Not Estab. %    Lymphocytes % 15 Not Estab. %    Monocytes % 14 Not Estab. %    Eosinophils % 5 Not Estab. %    Basophils % 1 Not Estab. %    Absolute Neutrophils 2.2 1.4 - 7.0 x10E3/uL    Absolute Lymphocytes 0.5 (L) 0.7 - 3.1 x10E3/uL    Absolute Monocytes  0.5 0.1 - 0.9 x10E3/uL    Absolute Eosinophils 0.2 0.0 - 0.4 x10E3/uL    Absolute Basophils  0.0 0.0 - 0.2 x10E3/uL    Immature Granulocytes 1 Not Estab. %    Bands Absolute 0.0 0 - 0 x10E3/uL   Magnesium Level    Collection Time: 08/10/19 10:35 AM   Result Value Ref Range    Magnesium 1.6 1.6 - 2.3 mg/dL   Renal Function Panel    Collection Time: 08/10/19 10:35 AM   Result Value Ref Range    Glucose 89 65 - 99 mg/dL    BUN 12 6 - 20 mg/dL    Creatinine 9.14 7.82 - 1.00 mg/dL    GFR MDRD Non Af Amer 86 >59 mL/min/1.73    GFR MDRD Af Amer 99 >59 mL/min/1.73    BUN/Creatinine Ratio 13 9 - 23    Sodium 136 134 - 144 mmol/L    Potassium 5.0 3.5 - 5.2 mmol/L    Chloride 102 96 - 106 mmol/L    CO2 19 (L) 20 - 29 mmol/L    Calcium 9.7 8.7 - 10.2 mg/dL    Phosphorus, Serum 3.6 3.0 - 4.3 mg/dL    Albumin 4.1 3.9 - 5.0 g/dL   Tacrolimus level    Collection Time: 08/10/19 10:35 AM   Result Value Ref Range    Tacrolimus Lvl 6.9 2.0 - 20.0 ng/mL   CBC w/ Differential    Collection Time: 08/14/19 10:56 AM   Result Value Ref Range    WBC 4.1 3.4 - 10.8 x10E3/uL    RBC 3.59 (L) 3.77 - 5.28 x10E6/uL    HGB 11.2 11.1 - 15.9 g/dL    HCT 95.6 (L) 21.3 - 46.6 %    MCV 94 79.0 - 97.0 fL    MCH 31.2 26.6 - 33.0 pg    MCHC 33.1 31.5 - 35.7 g/dL    RDW 08.6 57.8 - 46.9 %    Platelet 219 150 - 450 x10E3/uL    Neutrophils % 66 Not Estab. %    Lymphocytes % 13 Not Estab. %    Monocytes % 14 Not Estab. %    Eosinophils % 4 Not Estab. %    Basophils % 1 Not Estab. %    Absolute Neutrophils 2.7 1.4 - 7.0 x10E3/uL    Absolute Lymphocytes 0.5 (L) 0.7 - 3.1 x10E3/uL    Absolute Monocytes  0.6 0.1 - 0.9 x10E3/uL    Absolute Eosinophils 0.2 0.0 - 0.4 x10E3/uL    Absolute Basophils  0.1 0.0 - 0.2 x10E3/uL    Immature Granulocytes 2 Not Estab. %    Bands Absolute 0.1 0 - 0  x10E3/uL   Magnesium Level    Collection Time: 08/14/19 10:56 AM   Result Value Ref Range    Magnesium 1.9 1.6 - 2.3 mg/dL   Renal Function Panel    Collection Time: 08/14/19 10:56 AM   Result Value Ref Range    Glucose 89 65 - 99 mg/dL    BUN 32 (H) 6 - 20 mg/dL    Creatinine 0.98 (H) 0.57 - 1.00 mg/dL    GFR MDRD Non Af Amer 73 >59 mL/min/1.73    GFR MDRD Af Amer 84 >59 mL/min/1.73    BUN/Creatinine Ratio 31 (H) 9 - 23    Sodium 138 134 - 144 mmol/L    Potassium 5.0 3.5 - 5.2 mmol/L    Chloride 109 (H) 96 - 106 mmol/L    CO2 16 (L) 20 - 29 mmol/L    Calcium 9.8 8.7 - 10.2 mg/dL    Phosphorus, Serum 4.8 (H) 3.0 - 4.3 mg/dL    Albumin 4.4 3.9 - 5.0 g/dL

## 2019-08-16 NOTE — Unmapped (Signed)
Assessment    Met w/ patient in [facility] today. Reviewed meds/symptoms. Any new medications? no                Pt reports no fever/cold/flu symptoms    BP: 108/76 today/ Home BP reported WNL/stable   BG: WNL   Headache/Dizziness/Lightheaded: denies   Hand tremors: intermittent   Numbness/tingling: denies   Fevers/chills/sweats: denies   CP/SOB/palpatations: denies   Nausea/vomiting/heartburn: denies   Diarrhea/constipation: denies   UTI symptoms (burn/pain/itch/frequency/urgency/odor/color/foam): denies   No visible or palpable edema    Appetite good; reports adequate hydration. 4x16oz    Pt reports being well rested and getting adequate exercise despite Covid 19 quarantine and ataxia. Taking care to mask, hand hygeine and minimal public activity. Offered support and guidance for this process given her immune suppressed state. Also discussed covid vaccine reduced coverage for transplant patients and importance of continuing to mask and practice safe distancing.    Last Envarsus taken 0900; held for this morning's labs.    No other complaints or concerns.     Referrals needed: Dr Norma Fredrickson re: fistula assessment    Immunization status: uts - covid vaxx x2 - pfizer      Functional Score: 100     Employment status: student in college    *discussed use of Myfortic w/ patient and consequences r/t birth defects. Pt refuses birth control at this time, but knows to report to provider or coordinator to discuss further if necessary.

## 2019-08-17 DIAGNOSIS — Z940 Kidney transplant status: Principal | ICD-10-CM

## 2019-08-17 DIAGNOSIS — D849 Immunodeficiency, unspecified: Principal | ICD-10-CM

## 2019-08-17 LAB — CMV DNA, QUANTITATIVE, PCR: CMV QUANT: <50 [IU]/mL — ABNORMAL HIGH (ref <0)

## 2019-08-17 LAB — TACROLIMUS, TROUGH: Lab: 6.6

## 2019-08-17 LAB — CMV QUANT: Lab: <50 — ABNORMAL HIGH

## 2019-08-17 LAB — VITAMIN D, TOTAL (25OH): Lab: 32.1

## 2019-08-17 LAB — BK BLOOD LOG(10): Lab: 0

## 2019-08-17 LAB — BK VIRUS QUANTITATIVE PCR, BLOOD: BK BLOOD RESULT: NOT DETECTED

## 2019-08-17 NOTE — Unmapped (Signed)
Dialysis Access Coordinator Note:  Carolyn [facility], DOB: Aug 06, 1990  Contact: mychart  Challenges: -  COVID-19 status: vaccinated    Issue/Concern:  Referral received, pt Evaluated by Dr Margaretmary Bayley concerns for pseudoaneurysms    Current Dialysis Access:   Left arm AV Fistula creation 11/2011 Dr Ashley Royalty at [facility], revision 02/2017 Dr Arelia Longest    Previous dialysis access history:   unknown    Vessel Mapping/Studies:   Duplex ordered  Last echo 02/25/2018    NOTES:   08/17/2019  Referral received, pt Evaluated by Dr Margaretmary Bayley and he has concerns re: thinning skin over her fistula  WQ referral placed for tracking  Request to schedule pt for duplex and in clinic visit on or about 09/21/2019 sent.     PMH: DDKidney transplant 03/10/2019; C3 Glomerulopathy; seizure disorder (last seizure 2014); PEA arrest 03/26/2011 with AKI and anoxic brain injury; HTN  PSH: DDkidney transplant 02/2019  Labs on 08/16/2019: sCr 1.00; GFR 76 post kidney txp  Anticoagulant/Antiplatelet medications: aspirin 81 mg daily; Indications: -; This medication status was reviewed on: 08/17/2019 via chart review    Nephrologist: Addison Bailey, MD    Dialysis Center:  [facility]  Dialysis Days:   -    Pt lives in: [place], [place]    [name], [place]  Dialysis Access Coordinator for Dr. Zara Chess of [facility]  [address], [place], [place] [address]  Desk: [phone]  Fax: [phone]

## 2019-08-18 LAB — ESTIMATED AVERAGE GLUCOSE: Estimated average glucose:MCnc:Pt:Bld:Qn:Estimated from glycated hemoglobin: 94

## 2019-08-21 DIAGNOSIS — D849 Immunodeficiency, unspecified: Principal | ICD-10-CM

## 2019-08-21 DIAGNOSIS — Z940 Kidney transplant status: Principal | ICD-10-CM

## 2019-08-22 DIAGNOSIS — Z940 Kidney transplant status: Principal | ICD-10-CM

## 2019-08-22 DIAGNOSIS — D849 Immunodeficiency, unspecified: Principal | ICD-10-CM

## 2019-08-23 LAB — HEMATOCRIT: Hematocrit:VFr:Pt:Bld:Qn:Automated count: 35

## 2019-08-23 LAB — CBC W/ DIFFERENTIAL
BANDED NEUTROPHILS ABSOLUTE COUNT: 0.1 10*3/uL (ref 0.0–0.1)
BASOPHILS ABSOLUTE COUNT: 0 10*3/uL (ref 0.0–0.2)
BASOPHILS RELATIVE PERCENT: 1 %
EOSINOPHILS ABSOLUTE COUNT: 0.2 10*3/uL (ref 0.0–0.4)
EOSINOPHILS RELATIVE PERCENT: 4 %
HEMATOCRIT: 35 % (ref 34.0–46.6)
HEMOGLOBIN: 11.2 g/dL (ref 11.1–15.9)
IMMATURE GRANULOCYTES: 2 %
LYMPHOCYTES RELATIVE PERCENT: 15 %
MEAN CORPUSCULAR HEMOGLOBIN CONC: 32 g/dL (ref 31.5–35.7)
MEAN CORPUSCULAR HEMOGLOBIN: 30.4 pg (ref 26.6–33.0)
MEAN CORPUSCULAR VOLUME: 95 fL (ref 79–97)
MONOCYTES ABSOLUTE COUNT: 0.5 10*3/uL (ref 0.1–0.9)
MONOCYTES RELATIVE PERCENT: 13 %
NEUTROPHILS ABSOLUTE COUNT: 2.7 10*3/uL (ref 1.4–7.0)
NEUTROPHILS RELATIVE PERCENT: 65 %
PLATELET COUNT: 269 10*3/uL (ref 150–450)
RED CELL DISTRIBUTION WIDTH: 12.5 % (ref 11.7–15.4)
WHITE BLOOD CELL COUNT: 4.1 10*3/uL (ref 3.4–10.8)

## 2019-08-23 LAB — RENAL FUNCTION PANEL
ALBUMIN: 4.2 g/dL (ref 3.9–5.0)
BLOOD UREA NITROGEN: 31 mg/dL — ABNORMAL HIGH (ref 6–20)
BUN / CREAT RATIO: 32 — ABNORMAL HIGH (ref 9–23)
CALCIUM: 9.6 mg/dL (ref 8.7–10.2)
CHLORIDE: 107 mmol/L — ABNORMAL HIGH (ref 96–106)
CO2: 17 mmol/L — ABNORMAL LOW (ref 20–29)
CREATININE: 0.98 mg/dL (ref 0.57–1.00)
GFR MDRD AF AMER: 90 mL/min/{1.73_m2}
GLUCOSE: 95 mg/dL (ref 65–99)
PHOSPHORUS, SERUM: 3.8 mg/dL (ref 3.0–4.3)

## 2019-08-23 LAB — VITAMIN D 1,25-DIHYDROXY: 1,25-Dihydroxyvitamin D:MCnc:Pt:Ser/Plas:Qn:: 49

## 2019-08-23 LAB — MAGNESIUM: Magnesium:MCnc:Pt:Ser/Plas:Qn:: 1.8

## 2019-08-23 LAB — ALBUMIN: Albumin:MCnc:Pt:Ser/Plas:Qn:: 4.2

## 2019-08-23 MED FILL — SODIUM BICARBONATE 650 MG TABLET: 90 days supply | Qty: 180 | Fill #0 | Status: AC

## 2019-08-23 MED FILL — SULFAMETHOXAZOLE 400 MG-TRIMETHOPRIM 80 MG TABLET: 28 days supply | Qty: 12 | Fill #5 | Status: AC

## 2019-08-23 MED FILL — SULFAMETHOXAZOLE 400 MG-TRIMETHOPRIM 80 MG TABLET: ORAL | 28 days supply | Qty: 12 | Fill #5

## 2019-08-24 DIAGNOSIS — D849 Immunodeficiency, unspecified: Principal | ICD-10-CM

## 2019-08-24 DIAGNOSIS — Z940 Kidney transplant status: Principal | ICD-10-CM

## 2019-08-24 LAB — TACROLIMUS BLOOD: Tacrolimus:MCnc:Pt:Bld:Qn:LC/MS/MS: 10.5

## 2019-08-24 NOTE — Unmapped (Signed)
[facility] Refill Coordination Note    Specialty Medication(s) to be Shipped:   Transplant: Envarsus 4mg  and mycophenolate mofetil 180mg     Other medication(s) to be shipped: magnesium     Carolyn [facility], DOB: Sep 09, 1990  Phone: [phone] (home)       All above HIPAA information was verified with patient.     Was a Nurse, learning disability used for this call? No    Completed refill call assessment today to schedule patient's medication shipment from the [facility] [phone]).       Specialty medication(s) and dose(s) confirmed: Regimen is correct and unchanged.   Changes to medications: Ramsey reports no changes at this time.  Changes to insurance: No  Questions for the pharmacist: No    Confirmed patient received Welcome Packet with first shipment. The patient will receive a drug information handout for each medication shipped and additional FDA Medication Guides as required.       DISEASE/MEDICATION-SPECIFIC INFORMATION        N/A    SPECIALTY MEDICATION ADHERENCE     Medication Adherence    Patient reported X missed doses in the last month: 0  Specialty Medication: Envarsus 4mg   Patient is on additional specialty medications: Yes  Additional Specialty Medications: Mycophenolate 180mg   Patient Reported Additional Medication X Missed Doses in the Last Month: 0  Patient is on more than two specialty medications: No        Envarsus 4 mg: 10 days of medicine on hand   Mycophenolate 180 mg: 10 days of medicine on hand     SHIPPING     Shipping address confirmed in Epic.     Delivery Scheduled: Yes, Expected medication delivery date: 09/01/2019.     Medication will be delivered via UPS to the prescription address in Epic WAM.    [name] [facility] Specialty Technician

## 2019-08-28 DIAGNOSIS — D849 Immunodeficiency, unspecified: Principal | ICD-10-CM

## 2019-08-28 DIAGNOSIS — Z940 Kidney transplant status: Principal | ICD-10-CM

## 2019-08-28 LAB — HLA DS POST TRANSPLANT
ANTI-DONOR DRW #2 MFI: 167 MFI
ANTI-DONOR HLA-A #1 MFI: 31 MFI
ANTI-DONOR HLA-A #2 MFI: 31 MFI
ANTI-DONOR HLA-B #1 MFI: 50 MFI
ANTI-DONOR HLA-B #2 MFI: 34 MFI
ANTI-DONOR HLA-C #1 MFI: 48 MFI
ANTI-DONOR HLA-DQB #1 MFI: 641 MFI
ANTI-DONOR HLA-DQB #2 MFI: 641 MFI
ANTI-DONOR HLA-DR #2 MFI: 253 MFI

## 2019-08-28 LAB — HLA CL2 AB RESULT: Lab: POSITIVE

## 2019-08-28 LAB — FSAB CLASS 2 ANTIBODY SPECIFICITY

## 2019-08-28 LAB — DONOR HLA-A ANTIGEN #2

## 2019-08-28 LAB — FSAB CLASS 1 ANTIBODY SPECIFICITY

## 2019-08-28 LAB — HLA CLASS 1 ANTIBODY RESULT: Lab: POSITIVE

## 2019-08-29 DIAGNOSIS — D849 Immunodeficiency, unspecified: Principal | ICD-10-CM

## 2019-08-29 DIAGNOSIS — Z940 Kidney transplant status: Principal | ICD-10-CM

## 2019-08-30 LAB — CBC W/ DIFFERENTIAL
BANDED NEUTROPHILS ABSOLUTE COUNT: 0.1 10*3/uL (ref 0.0–0.1)
BASOPHILS ABSOLUTE COUNT: 0 10*3/uL (ref 0.0–0.2)
BASOPHILS RELATIVE PERCENT: 1 %
EOSINOPHILS ABSOLUTE COUNT: 0.1 10*3/uL (ref 0.0–0.4)
EOSINOPHILS RELATIVE PERCENT: 3 %
HEMATOCRIT: 33.9 % — ABNORMAL LOW (ref 34.0–46.6)
HEMOGLOBIN: 11 g/dL — ABNORMAL LOW (ref 11.1–15.9)
IMMATURE GRANULOCYTES: 1 %
LYMPHOCYTES ABSOLUTE COUNT: 0.7 10*3/uL (ref 0.7–3.1)
LYMPHOCYTES RELATIVE PERCENT: 18 %
MEAN CORPUSCULAR HEMOGLOBIN CONC: 32.4 g/dL (ref 31.5–35.7)
MEAN CORPUSCULAR HEMOGLOBIN: 29.9 pg (ref 26.6–33.0)
MONOCYTES ABSOLUTE COUNT: 0.4 10*3/uL (ref 0.1–0.9)
MONOCYTES RELATIVE PERCENT: 10 %
NEUTROPHILS ABSOLUTE COUNT: 2.5 10*3/uL (ref 1.4–7.0)
NEUTROPHILS RELATIVE PERCENT: 67 %
PLATELET COUNT: 246 10*3/uL (ref 150–450)
RED BLOOD CELL COUNT: 3.68 x10E6/uL — ABNORMAL LOW (ref 3.77–5.28)
RED CELL DISTRIBUTION WIDTH: 12.5 % (ref 11.7–15.4)
WHITE BLOOD CELL COUNT: 3.8 10*3/uL (ref 3.4–10.8)

## 2019-08-30 LAB — RENAL FUNCTION PANEL
ALBUMIN: 4.2 g/dL (ref 3.9–5.0)
BLOOD UREA NITROGEN: 25 mg/dL — ABNORMAL HIGH (ref 6–20)
BUN / CREAT RATIO: 28 — ABNORMAL HIGH (ref 9–23)
CALCIUM: 9.7 mg/dL (ref 8.7–10.2)
CHLORIDE: 105 mmol/L (ref 96–106)
CO2: 19 mmol/L — ABNORMAL LOW (ref 20–29)
CREATININE: 0.9 mg/dL (ref 0.57–1.00)
GFR MDRD AF AMER: 99 mL/min/{1.73_m2}
GFR MDRD NON AF AMER: 86 mL/min/{1.73_m2}
POTASSIUM: 4.9 mmol/L (ref 3.5–5.2)
SODIUM: 136 mmol/L (ref 134–144)

## 2019-08-30 LAB — PROTEIN / CREATININE RATIO, URINE: PROTEIN/CREAT RATIO: 475 mg/g{creat} — ABNORMAL HIGH (ref 0–200)

## 2019-08-30 LAB — PROTEIN URINE: Protein:MCnc:Pt:Urine:Qn:: 44

## 2019-08-30 LAB — MAGNESIUM: Magnesium:MCnc:Pt:Ser/Plas:Qn:: 1.8

## 2019-08-30 LAB — CALCIUM: Calcium:MCnc:Pt:Ser/Plas:Qn:: 9.7

## 2019-08-30 LAB — EOSINOPHILS ABSOLUTE COUNT: Eosinophils:NCnc:Pt:Bld:Qn:Automated count: 0.1

## 2019-08-31 DIAGNOSIS — Z940 Kidney transplant status: Principal | ICD-10-CM

## 2019-08-31 DIAGNOSIS — D849 Immunodeficiency, unspecified: Principal | ICD-10-CM

## 2019-08-31 LAB — TACROLIMUS BLOOD: Tacrolimus:MCnc:Pt:Bld:Qn:LC/MS/MS: 4.7

## 2019-08-31 MED FILL — MYCOPHENOLATE SODIUM 180 MG TABLET,DELAYED RELEASE: ORAL | 30 days supply | Qty: 180 | Fill #5

## 2019-08-31 MED FILL — MG-PLUS-PROTEIN 133 MG TABLET: ORAL | 30 days supply | Qty: 120 | Fill #5

## 2019-08-31 MED FILL — MYCOPHENOLATE SODIUM 180 MG TABLET,DELAYED RELEASE: 30 days supply | Qty: 180 | Fill #5 | Status: AC

## 2019-08-31 MED FILL — ENVARSUS XR 4 MG TABLET,EXTENDED RELEASE: ORAL | 30 days supply | Qty: 60 | Fill #3

## 2019-08-31 MED FILL — ENVARSUS XR 4 MG TABLET,EXTENDED RELEASE: 30 days supply | Qty: 60 | Fill #3 | Status: AC

## 2019-08-31 MED FILL — MG-PLUS-PROTEIN 133 MG TABLET: 30 days supply | Qty: 120 | Fill #5 | Status: AC

## 2019-09-04 DIAGNOSIS — Z940 Kidney transplant status: Principal | ICD-10-CM

## 2019-09-04 DIAGNOSIS — D849 Immunodeficiency, unspecified: Principal | ICD-10-CM

## 2019-09-05 DIAGNOSIS — Z940 Kidney transplant status: Principal | ICD-10-CM

## 2019-09-05 DIAGNOSIS — D849 Immunodeficiency, unspecified: Principal | ICD-10-CM

## 2019-09-06 LAB — CBC W/ DIFFERENTIAL
BANDED NEUTROPHILS ABSOLUTE COUNT: 0 10*3/uL (ref 0.0–0.1)
BASOPHILS ABSOLUTE COUNT: 0 10*3/uL (ref 0.0–0.2)
EOSINOPHILS ABSOLUTE COUNT: 0.1 10*3/uL (ref 0.0–0.4)
HEMATOCRIT: 33.3 % — ABNORMAL LOW (ref 34.0–46.6)
HEMOGLOBIN: 10.9 g/dL — ABNORMAL LOW (ref 11.1–15.9)
IMMATURE GRANULOCYTES: 1 %
LYMPHOCYTES ABSOLUTE COUNT: 0.7 10*3/uL (ref 0.7–3.1)
LYMPHOCYTES RELATIVE PERCENT: 21 %
MEAN CORPUSCULAR HEMOGLOBIN: 30.3 pg (ref 26.6–33.0)
MEAN CORPUSCULAR VOLUME: 93 fL (ref 79–97)
MONOCYTES ABSOLUTE COUNT: 0.4 10*3/uL (ref 0.1–0.9)
MONOCYTES RELATIVE PERCENT: 11 %
NEUTROPHILS ABSOLUTE COUNT: 2.2 10*3/uL (ref 1.4–7.0)
NEUTROPHILS RELATIVE PERCENT: 62 %
PLATELET COUNT: 234 10*3/uL (ref 150–450)
RED CELL DISTRIBUTION WIDTH: 12.5 % (ref 11.7–15.4)
WHITE BLOOD CELL COUNT: 3.5 10*3/uL (ref 3.4–10.8)

## 2019-09-06 LAB — RENAL FUNCTION PANEL
ALBUMIN: 4.1 g/dL (ref 3.9–5.0)
BLOOD UREA NITROGEN: 23 mg/dL — ABNORMAL HIGH (ref 6–20)
BUN / CREAT RATIO: 24 — ABNORMAL HIGH (ref 9–23)
CALCIUM: 9.6 mg/dL (ref 8.7–10.2)
CHLORIDE: 105 mmol/L (ref 96–106)
CO2: 18 mmol/L — ABNORMAL LOW (ref 20–29)
CREATININE: 0.95 mg/dL (ref 0.57–1.00)
GFR MDRD AF AMER: 93 mL/min/{1.73_m2}
GFR MDRD NON AF AMER: 81 mL/min/{1.73_m2}
PHOSPHORUS, SERUM: 4.2 mg/dL (ref 3.0–4.3)
SODIUM: 137 mmol/L (ref 134–144)

## 2019-09-06 LAB — MAGNESIUM: Magnesium:MCnc:Pt:Ser/Plas:Qn:: 1.7

## 2019-09-06 LAB — HEMATOCRIT: Hematocrit:VFr:Pt:Bld:Qn:Automated count: 33.3 — ABNORMAL LOW

## 2019-09-06 LAB — ALBUMIN: Albumin:MCnc:Pt:Ser/Plas:Qn:: 4.1

## 2019-09-07 DIAGNOSIS — Z940 Kidney transplant status: Principal | ICD-10-CM

## 2019-09-07 DIAGNOSIS — D849 Immunodeficiency, unspecified: Principal | ICD-10-CM

## 2019-09-07 LAB — TACROLIMUS BLOOD: Tacrolimus:MCnc:Pt:Bld:Qn:LC/MS/MS: 9

## 2019-09-08 LAB — CMV QUANT: Cytomegalovirus DNA:ACnc:Pt:Plas:Qn:Probe.amp.tar: NEGATIVE

## 2019-09-08 LAB — CMV DNA, QUANTITATIVE, PCR: CMV QUANT: NEGATIVE [IU]/mL

## 2019-09-11 DIAGNOSIS — Z940 Kidney transplant status: Principal | ICD-10-CM

## 2019-09-11 DIAGNOSIS — D849 Immunodeficiency, unspecified: Principal | ICD-10-CM

## 2019-09-12 DIAGNOSIS — D849 Immunodeficiency, unspecified: Principal | ICD-10-CM

## 2019-09-12 DIAGNOSIS — Z940 Kidney transplant status: Principal | ICD-10-CM

## 2019-09-13 LAB — RENAL FUNCTION PANEL
ALBUMIN: 4.1 g/dL (ref 3.9–5.0)
BUN / CREAT RATIO: 32 — ABNORMAL HIGH (ref 9–23)
CALCIUM: 9.7 mg/dL (ref 8.7–10.2)
CHLORIDE: 106 mmol/L (ref 96–106)
CO2: 19 mmol/L — ABNORMAL LOW (ref 20–29)
CREATININE: 0.93 mg/dL (ref 0.57–1.00)
GFR MDRD AF AMER: 95 mL/min/{1.73_m2}
GFR MDRD NON AF AMER: 83 mL/min/{1.73_m2}
GLUCOSE: 88 mg/dL (ref 65–99)
PHOSPHORUS, SERUM: 4.3 mg/dL (ref 3.0–4.3)
SODIUM: 136 mmol/L (ref 134–144)

## 2019-09-13 LAB — CBC W/ DIFFERENTIAL
BANDED NEUTROPHILS ABSOLUTE COUNT: 0 10*3/uL (ref 0.0–0.1)
BASOPHILS ABSOLUTE COUNT: 0 10*3/uL (ref 0.0–0.2)
BASOPHILS RELATIVE PERCENT: 1 %
EOSINOPHILS ABSOLUTE COUNT: 0.1 10*3/uL (ref 0.0–0.4)
EOSINOPHILS RELATIVE PERCENT: 3 %
HEMATOCRIT: 33.1 % — ABNORMAL LOW (ref 34.0–46.6)
HEMOGLOBIN: 10.7 g/dL — ABNORMAL LOW (ref 11.1–15.9)
IMMATURE GRANULOCYTES: 1 %
LYMPHOCYTES ABSOLUTE COUNT: 0.8 10*3/uL (ref 0.7–3.1)
LYMPHOCYTES RELATIVE PERCENT: 19 %
MEAN CORPUSCULAR HEMOGLOBIN CONC: 32.3 g/dL (ref 31.5–35.7)
MEAN CORPUSCULAR HEMOGLOBIN: 29.6 pg (ref 26.6–33.0)
MEAN CORPUSCULAR VOLUME: 92 fL (ref 79–97)
MONOCYTES RELATIVE PERCENT: 12 %
NEUTROPHILS ABSOLUTE COUNT: 2.6 10*3/uL (ref 1.4–7.0)
NEUTROPHILS RELATIVE PERCENT: 64 %
PLATELET COUNT: 215 10*3/uL (ref 150–450)
RED BLOOD CELL COUNT: 3.61 x10E6/uL — ABNORMAL LOW (ref 3.77–5.28)
RED CELL DISTRIBUTION WIDTH: 12.8 % (ref 11.7–15.4)
WHITE BLOOD CELL COUNT: 4 10*3/uL (ref 3.4–10.8)

## 2019-09-13 LAB — SODIUM: Sodium:SCnc:Pt:Ser/Plas:Qn:: 136

## 2019-09-13 LAB — MEAN CORPUSCULAR HEMOGLOBIN: Erythrocyte mean corpuscular hemoglobin:EntMass:Pt:RBC:Qn:Automated count: 29.6

## 2019-09-13 LAB — MAGNESIUM: Magnesium:MCnc:Pt:Ser/Plas:Qn:: 1.7

## 2019-09-13 MED FILL — CLONAZEPAM 0.5 MG TABLET: ORAL | 30 days supply | Qty: 60 | Fill #5

## 2019-09-13 MED FILL — CLONAZEPAM 0.5 MG TABLET: 30 days supply | Qty: 60 | Fill #5 | Status: AC

## 2019-09-14 DIAGNOSIS — Z940 Kidney transplant status: Principal | ICD-10-CM

## 2019-09-14 DIAGNOSIS — D849 Immunodeficiency, unspecified: Principal | ICD-10-CM

## 2019-09-14 LAB — TACROLIMUS BLOOD: Tacrolimus:MCnc:Pt:Bld:Qn:LC/MS/MS: 4.7

## 2019-09-18 DIAGNOSIS — D849 Immunodeficiency, unspecified: Principal | ICD-10-CM

## 2019-09-18 DIAGNOSIS — Z940 Kidney transplant status: Principal | ICD-10-CM

## 2019-09-19 DIAGNOSIS — D849 Immunodeficiency, unspecified: Principal | ICD-10-CM

## 2019-09-19 DIAGNOSIS — Z940 Kidney transplant status: Principal | ICD-10-CM

## 2019-09-19 MED FILL — CHOLECALCIFEROL (VITAMIN D3) 125 MCG (5,000 UNIT) CAPSULE: ORAL | 100 days supply | Qty: 100 | Fill #1

## 2019-09-19 MED FILL — CHOLECALCIFEROL (VITAMIN D3) 125 MCG (5,000 UNIT) CAPSULE: 100 days supply | Qty: 100 | Fill #1 | Status: AC

## 2019-09-20 LAB — CBC W/ DIFFERENTIAL
BANDED NEUTROPHILS ABSOLUTE COUNT: 0.1 10*3/uL (ref 0.0–0.1)
BASOPHILS ABSOLUTE COUNT: 0 10*3/uL (ref 0.0–0.2)
BASOPHILS RELATIVE PERCENT: 1 %
EOSINOPHILS RELATIVE PERCENT: 3 %
HEMATOCRIT: 33 % — ABNORMAL LOW (ref 34.0–46.6)
HEMOGLOBIN: 10.8 g/dL — ABNORMAL LOW (ref 11.1–15.9)
IMMATURE GRANULOCYTES: 2 %
LYMPHOCYTES ABSOLUTE COUNT: 0.6 10*3/uL — ABNORMAL LOW (ref 0.7–3.1)
LYMPHOCYTES RELATIVE PERCENT: 15 %
MEAN CORPUSCULAR HEMOGLOBIN CONC: 32.7 g/dL (ref 31.5–35.7)
MONOCYTES RELATIVE PERCENT: 11 %
NEUTROPHILS ABSOLUTE COUNT: 2.9 10*3/uL (ref 1.4–7.0)
NEUTROPHILS RELATIVE PERCENT: 68 %
PLATELET COUNT: 219 10*3/uL (ref 150–450)
RED BLOOD CELL COUNT: 3.6 x10E6/uL — ABNORMAL LOW (ref 3.77–5.28)
RED CELL DISTRIBUTION WIDTH: 12.9 % (ref 11.7–15.4)
WHITE BLOOD CELL COUNT: 4.2 10*3/uL (ref 3.4–10.8)

## 2019-09-20 LAB — RENAL FUNCTION PANEL
ALBUMIN: 4.1 g/dL (ref 3.9–5.0)
BLOOD UREA NITROGEN: 19 mg/dL (ref 6–20)
BUN / CREAT RATIO: 25 — ABNORMAL HIGH (ref 9–23)
CHLORIDE: 106 mmol/L (ref 96–106)
CO2: 21 mmol/L (ref 20–29)
CREATININE: 0.75 mg/dL (ref 0.57–1.00)
GFR MDRD NON AF AMER: 107 mL/min/{1.73_m2}
GLUCOSE: 89 mg/dL (ref 65–99)
PHOSPHORUS, SERUM: 4.1 mg/dL (ref 3.0–4.3)
POTASSIUM: 4.8 mmol/L (ref 3.5–5.2)
SODIUM: 137 mmol/L (ref 134–144)

## 2019-09-20 LAB — MAGNESIUM: Magnesium:MCnc:Pt:Ser/Plas:Qn:: 1.6

## 2019-09-20 LAB — MEAN CORPUSCULAR VOLUME: Erythrocyte mean corpuscular volume:EntVol:Pt:RBC:Qn:Automated count: 92

## 2019-09-20 LAB — GFR MDRD AF AMER: Glomerular filtration rate/1.73 sq M.predicted.black:ArVRat:Pt:Ser/Plas/Bld:Qn:Creatinine-based formula (CKD-EPI): 124

## 2019-09-21 ENCOUNTER — Encounter: Admit: 2019-09-21 | Discharge: 2019-09-21 | Payer: PRIVATE HEALTH INSURANCE

## 2019-09-21 ENCOUNTER — Ambulatory Visit: Admit: 2019-09-21 | Discharge: 2019-09-21 | Payer: PRIVATE HEALTH INSURANCE

## 2019-09-21 DIAGNOSIS — R569 Unspecified convulsions: Principal | ICD-10-CM

## 2019-09-21 DIAGNOSIS — N186 End stage renal disease: Principal | ICD-10-CM

## 2019-09-21 DIAGNOSIS — Z940 Kidney transplant status: Principal | ICD-10-CM

## 2019-09-21 DIAGNOSIS — I10 Essential (primary) hypertension: Principal | ICD-10-CM

## 2019-09-21 DIAGNOSIS — I469 Cardiac arrest, cause unspecified: Principal | ICD-10-CM

## 2019-09-21 DIAGNOSIS — D849 Immunodeficiency, unspecified: Principal | ICD-10-CM

## 2019-09-21 DIAGNOSIS — G931 Anoxic brain damage, not elsewhere classified: Principal | ICD-10-CM

## 2019-09-21 LAB — TACROLIMUS BLOOD: Tacrolimus:MCnc:Pt:Bld:Qn:LC/MS/MS: 4.3

## 2019-09-21 NOTE — Unmapped (Signed)
VASCULAR ACCESS CLINIC    Assessment/Plan  Carolyn Kelley presented to clinic for vascular access consultation at the referral of kidney transplant team for fistula ligation/pseudoaneurysm management.    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 Creat 0.75.    Patient counseled on banding/revision vs. Ligation. Recommended ligation an option for this mega AVF, given high flow and kidney transplant.   She understands that she would no longer be able to dialyze with fistula if she proceeds.     ECHO to be scheduled.    She would like to defer surgery until she is 1 year post-transplant.     I personally spent 30 minutes face-to-face and non-face-to-face in the care of this patient, which includes all pre, intra, and post visit time on the date of service. Greater than 50% of the time was spent on counseling and the substance of the discussion.    Subjective  Carolyn Kelley is a 29 y.o. 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  She has previously had all vascular surgery care in [place]. She underwent aneurysm repair just prior to transplant but has a persistent area, stable in size, but large, distended, pulsatile and hypopigmented. She has good kidney function and per nephrology.      Allergies  Bee pollen and Pollen extracts    Medications    Current Outpatient Medications   Medication Sig Dispense Refill   ??? acetaminophen (TYLENOL) 500 MG tablet Take 1-2 tablets (500-1,000 mg total) by mouth every six (6) hours as needed for pain or fever (> 38C). 100 tablet 0   ??? aspirin (ECOTRIN) 81 MG tablet Take 1 tablet (81 mg total) by mouth daily. 30 tablet 11   ??? cholecalciferol, vitamin D3-125 mcg, 5,000 unit,, 125 mcg (5,000 unit) capsule Take 1 capsule (125 mcg total) by mouth daily. 100 capsule 11   ??? clonazePAM (KLONOPIN) 0.5 MG tablet Take 1 tablet (0.5 mg total) by mouth Two (2) times a day. 60 tablet 5   ??? levETIRAcetam (KEPPRA) 250 MG tablet TAKE 1 TABLET BY MOUTH TWICE DAILY. 180 tablet 3   ??? magnesium oxide-Mg AA chelate (MAGNESIUM, AMINO ACID CHELATE,) 133 mg Tab Take 2 tablets by mouth Two (2) times a day. 120 tablet 11   ??? mycophenolate (MYFORTIC) 180 MG EC tablet Take 3 tablets (540 mg total) by mouth Two (2) times a day. Adjust dose per medication card. 180 tablet 11   ??? sodium bicarbonate 650 mg tablet Take 1 tablet (650 mg total) by mouth Two (2) times a day. 60 tablet 11   ??? tacrolimus (ENVARSUS XR) 4 mg Tb24 extended release tablet Take 2 tablets (8 mg total) by mouth daily. 60 tablet 11     No current facility-administered medications for this visit.       Past Medical History  Past Medical History:   Diagnosis Date   ??? 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 Depressive disorder, not elsewhere classified    ??? Migraine with aura, without mention of intractable migraine without mention of status migrainosus    ??? Seizures (CMS-HCC)    ??? Unspecified vitamin B deficiency        Past Surgical History  Past Surgical History:   Procedure Laterality Date   ??? AV FISTULA PLACEMENT Left    ??? PR TRANSPLANT,PREP CADAVER RENAL GRAFT N/A 03/10/2019    Procedure: [facility] STD PREP CAD DONR RENAL ALLOGFT  PRIOR TO TRNSPLNT, INCL DISSEC/REM PERINEPH FAT, DIAPH/RTPER ATTAC;  Surgeon: Leona Carry, MD;  Location: MAIN OR [facility];  Service: Transplant   ??? PR TRANSPLANTATION OF KIDNEY N/A 03/10/2019    Procedure: RENAL ALLOTRANSPLANTATION, IMPLANTATION OF GRAFT; WITHOUT RECIPIENT NEPHRECTOMY;  Surgeon: Leona Carry, MD;  Location: MAIN OR [facility];  Service: Transplant       Review of Systems  A 12 system review of systems was negative except as noted in HPI    Objective  Blood pressure 109/83, pulse 96, temperature 36.5 ??C (97.7 ??F), temperature source Tympanic, height 157.5 cm (5' 2), weight 71.8 kg (158 lb 4.8 oz). Body mass index is 28.95 kg/m??.    Lungs: clear to auscultation, percussion to the bases, and unlabored breathing   Heart: euvolemic, regular rate and rhythm, normal S1 and S2, no murmur  Ext: no edema, well perfused and large aneurysmal fistula with hypopigmentation and pulsatility. prior area of revision well healed  Neuro: delayed processing, walks with rollator walker      Test Results  Lab Results   Component Value Date    WBC 4.2 09/19/2019    HGB 10.8 (L) 09/19/2019    HCT 33.0 (L) 09/19/2019    PLT 219 09/19/2019     Lab Results   Component Value Date    NA 137 09/19/2019    K 4.8 09/19/2019    CL 106 09/19/2019    CO2 21 09/19/2019    BUN 19 09/19/2019    CREATININE 0.75 09/19/2019    CALCIUM 9.6 09/19/2019    MG 1.6 09/19/2019    PHOS 4.3 08/16/2019       Imaging/Vein Mapping:        _______________________________________    Gertie Fey, DNP, APRN, FNP-C  [facility]  [address]  [place], [place]  [address]

## 2019-09-22 DIAGNOSIS — Z79899 Other long term (current) drug therapy: Principal | ICD-10-CM

## 2019-09-22 DIAGNOSIS — Z940 Kidney transplant status: Principal | ICD-10-CM

## 2019-09-22 LAB — LOG10 CMV QN DNA PL

## 2019-09-22 LAB — CMV DNA, QUANTITATIVE, PCR: CMV QUANT: POSITIVE [IU]/mL

## 2019-09-22 MED ORDER — TACROLIMUS XR 4 MG TABLET,EXTENDED RELEASE 24 HR
ORAL_TABLET | 11 refills | 0 days
Start: 2019-09-22 — End: ?

## 2019-09-22 MED ORDER — TACROLIMUS XR 1 MG TABLET,EXTENDED RELEASE 24 HR
ORAL_TABLET | 11 refills | 0 days | Status: CP
Start: 2019-09-22 — End: ?

## 2019-09-22 NOTE — Unmapped (Addendum)
[facility] Refill Coordination Note    Specialty Medication(s) to be Shipped:   Transplant: Envarsus 4mg , Envarsus 1mg  and  mycophenolic acid 180mg     Other medication(s) to be shipped: magnesium 133mg , levetiracetam 250mg      Carolyn Kelley, DOB: 1991-03-06  Phone: [phone] (home)       All above HIPAA information was verified with patient.     Was a Nurse, learning disability used for this call? No    Completed refill call assessment today to schedule patient's medication shipment from the [facility] [phone]).       Specialty medication(s) and dose(s) confirmed: Regimen is correct and unchanged.   Changes to medications: Ethyl reports no changes at this time.  Changes to insurance: No  Questions for the pharmacist: No    Confirmed patient received Welcome Packet with first shipment. The patient will receive a drug information handout for each medication shipped and additional FDA Medication Guides as required.       DISEASE/MEDICATION-SPECIFIC INFORMATION        N/A    SPECIALTY MEDICATION ADHERENCE     Medication Adherence    Patient reported X missed doses in the last month: 0  Specialty Medication: Envarsus 4mg   Patient is on additional specialty medications: Yes  Additional Specialty Medications: Mycophenolate 180mg   Patient Reported Additional Medication X Missed Doses in the Last Month: 0  Patient is on more than two specialty medications: Yes  Specialty Medication: Envarsus Xr 1mg   Patient Reported Additional Medication X Missed Doses in the Last Month: 0  Informant: patient              Envarsus Xr 1mg  2 days of medicine on hand.  Envarsus 4 mg: 10 days of medicine on hand   Mycophenolate 180 mg: 10 days of medicine on hand         SHIPPING     Shipping address confirmed in Epic.     Delivery Scheduled: Yes, Expected medication delivery date: 09/27/19.     Medication will be delivered via UPS to the prescription address in Epic WAM.    Tera Helper   [facility] Specialty Technician

## 2019-09-22 NOTE — Unmapped (Signed)
Pt's tac level low again at 4.3, despite her report of it being at 24hour trough. Reviewed w/ PharmD Mincemoyer and advised pt to inrease dose to 9mg  daily. Ordered 1mg  tabs and requested it be overnighted to her. She plans for her next lab draw to be Tuesday 7/6.

## 2019-09-22 NOTE — Unmapped (Signed)
Dialysis Access Coordinator Note:  Carolyn Kelley, DOB: Sep 05, 1990  Contact: mychart  Challenges: -  COVID-19 status: vaccinated    Issue/Concern:  Referral received, pt Evaluated by Dr Margaretmary Bayley concerns for pseudoaneurysms    Current Dialysis Access:   Left arm AV Fistula creation 11/2011 Dr Ashley Royalty at [facility], revision 02/2017 Dr Arelia Longest    Previous dialysis access history:   unknown    Vessel Mapping/Studies:   Duplex 09/21/2019  Last echo 02/25/2018    NOTES:   09/22/2019  Pt was seen and evaluated in clinic on 09/22/2019.   Pt found to have L BB AVF mega-fistula and planning for ligation in progress. Echocardiogram ordered.  Plan  Get pre op echocardiogram; duplex is complete.  Once resulted, schedule AV fistula ligation.   Consent to be signed day for surgery.  Request to schedule echocardiogram and date, time, location sent.   Will monitor.     08/17/2019  Referral received, pt Evaluated by Dr Margaretmary Bayley and he has concerns re: thinning skin over her fistula  WQ referral placed for tracking  Request to schedule pt for duplex and in clinic visit on or about 09/21/2019 sent.     PMH: DDKidney transplant 03/10/2019; C3 Glomerulopathy; seizure disorder (last seizure 2014); PEA arrest 03/26/2011 with AKI and anoxic brain injury; HTN  PSH: DDkidney transplant 02/2019  Labs on 08/16/2019: sCr 1.00; GFR 76 post kidney txp  Anticoagulant/Antiplatelet medications: aspirin 81 mg daily; Indications: -; This medication status was reviewed on: 08/17/2019 via chart review    Nephrologist: Addison Bailey, MD    Dialysis Center:  [facility]  Dialysis Days:   -    Pt lives in: [place], [place]    [name], [place]  Dialysis Access Coordinator for Dr. Zara Chess of [facility] for Transplant Care  [address] [place], [place] [address]  Desk: [phone]  Fax: [phone]

## 2019-09-25 DIAGNOSIS — Z940 Kidney transplant status: Principal | ICD-10-CM

## 2019-09-25 DIAGNOSIS — D849 Immunodeficiency, unspecified: Principal | ICD-10-CM

## 2019-09-26 DIAGNOSIS — Z940 Kidney transplant status: Principal | ICD-10-CM

## 2019-09-26 DIAGNOSIS — D849 Immunodeficiency, unspecified: Principal | ICD-10-CM

## 2019-09-26 MED ORDER — TACROLIMUS XR 4 MG TABLET,EXTENDED RELEASE 24 HR
ORAL_TABLET | 11 refills | 0 days | Status: CP
Start: 2019-09-26 — End: ?
  Filled 2019-09-26: qty 60, 30d supply, fill #0

## 2019-09-26 MED FILL — MG-PLUS-PROTEIN 133 MG TABLET: ORAL | 30 days supply | Qty: 120 | Fill #6

## 2019-09-26 MED FILL — LEVETIRACETAM 250 MG TABLET: 90 days supply | Qty: 180 | Fill #1 | Status: AC

## 2019-09-26 MED FILL — MG-PLUS-PROTEIN 133 MG TABLET: 30 days supply | Qty: 120 | Fill #6 | Status: AC

## 2019-09-26 MED FILL — LEVETIRACETAM 250 MG TABLET: ORAL | 90 days supply | Qty: 180 | Fill #1

## 2019-09-26 MED FILL — MYCOPHENOLATE SODIUM 180 MG TABLET,DELAYED RELEASE: ORAL | 30 days supply | Qty: 180 | Fill #6

## 2019-09-26 MED FILL — ENVARSUS XR 4 MG TABLET,EXTENDED RELEASE: 30 days supply | Qty: 60 | Fill #0 | Status: AC

## 2019-09-26 MED FILL — ENVARSUS XR 1 MG TABLET,EXTENDED RELEASE: 30 days supply | Qty: 30 | Fill #0 | Status: AC

## 2019-09-26 MED FILL — ENVARSUS XR 1 MG TABLET,EXTENDED RELEASE: 30 days supply | Qty: 30 | Fill #0

## 2019-09-26 MED FILL — MYCOPHENOLATE SODIUM 180 MG TABLET,DELAYED RELEASE: 30 days supply | Qty: 180 | Fill #6 | Status: AC

## 2019-09-26 NOTE — Unmapped (Signed)
Clinical Assessment Needed For: Dose Change  Medication: Envarsus  Last Fill Date: 08/31/19  Copay $0  Was previous dose already scheduled to fill: Yes -- 4mg  tablets were scheduled to fill today    Notes to Pharmacist: see routing comments

## 2019-09-27 LAB — RENAL FUNCTION PANEL
ALBUMIN: 4.3 g/dL (ref 3.9–5.0)
BLOOD UREA NITROGEN: 22 mg/dL — ABNORMAL HIGH (ref 6–20)
CALCIUM: 9.8 mg/dL (ref 8.7–10.2)
CHLORIDE: 104 mmol/L (ref 96–106)
CO2: 18 mmol/L — ABNORMAL LOW (ref 20–29)
CREATININE: 0.91 mg/dL (ref 0.57–1.00)
GFR MDRD AF AMER: 98 mL/min/{1.73_m2}
GFR MDRD NON AF AMER: 85 mL/min/{1.73_m2}
PHOSPHORUS, SERUM: 4.1 mg/dL (ref 3.0–4.3)
POTASSIUM: 5 mmol/L (ref 3.5–5.2)

## 2019-09-27 LAB — CBC W/ DIFFERENTIAL
BANDED NEUTROPHILS ABSOLUTE COUNT: 0.1 10*3/uL (ref 0.0–0.1)
BASOPHILS ABSOLUTE COUNT: 0 10*3/uL (ref 0.0–0.2)
BASOPHILS RELATIVE PERCENT: 1 %
EOSINOPHILS ABSOLUTE COUNT: 0.1 10*3/uL (ref 0.0–0.4)
EOSINOPHILS RELATIVE PERCENT: 2 %
HEMATOCRIT: 35 % (ref 34.0–46.6)
HEMOGLOBIN: 11.5 g/dL (ref 11.1–15.9)
IMMATURE GRANULOCYTES: 1 %
LYMPHOCYTES ABSOLUTE COUNT: 0.7 10*3/uL (ref 0.7–3.1)
LYMPHOCYTES RELATIVE PERCENT: 14 %
MEAN CORPUSCULAR HEMOGLOBIN: 30.4 pg (ref 26.6–33.0)
MEAN CORPUSCULAR VOLUME: 93 fL (ref 79–97)
MONOCYTES ABSOLUTE COUNT: 0.5 10*3/uL (ref 0.1–0.9)
MONOCYTES RELATIVE PERCENT: 11 %
NEUTROPHILS ABSOLUTE COUNT: 3.3 10*3/uL (ref 1.4–7.0)
NEUTROPHILS RELATIVE PERCENT: 71 %
PLATELET COUNT: 223 10*3/uL (ref 150–450)
RED BLOOD CELL COUNT: 3.78 x10E6/uL (ref 3.77–5.28)
WHITE BLOOD CELL COUNT: 4.6 10*3/uL (ref 3.4–10.8)

## 2019-09-27 LAB — HEMOGLOBIN: Hemoglobin:MCnc:Pt:Bld:Qn:: 11.5

## 2019-09-27 LAB — MAGNESIUM: Magnesium:MCnc:Pt:Ser/Plas:Qn:: 1.6

## 2019-09-27 LAB — PROTEIN URINE: Protein:MCnc:Pt:Urine:Qn:: 159.4

## 2019-09-27 LAB — GFR MDRD NON AF AMER
Glomerular filtration rate/1.73 sq M.predicted.non black:ArVRat:Pt:Ser/Plas/Bld:Qn:Creatinine-based formula (CKD-EPI): 85

## 2019-09-27 LAB — PROTEIN / CREATININE RATIO, URINE: PROTEIN URINE: 159.4 mg/dL

## 2019-09-28 DIAGNOSIS — Z940 Kidney transplant status: Principal | ICD-10-CM

## 2019-09-28 DIAGNOSIS — D849 Immunodeficiency, unspecified: Principal | ICD-10-CM

## 2019-09-28 LAB — TACROLIMUS BLOOD: Tacrolimus:MCnc:Pt:Bld:Qn:LC/MS/MS: 9

## 2019-09-29 NOTE — Unmapped (Signed)
Update unos form

## 2019-10-02 DIAGNOSIS — Z940 Kidney transplant status: Principal | ICD-10-CM

## 2019-10-02 DIAGNOSIS — D849 Immunodeficiency, unspecified: Principal | ICD-10-CM

## 2019-10-03 DIAGNOSIS — Z940 Kidney transplant status: Principal | ICD-10-CM

## 2019-10-03 DIAGNOSIS — D849 Immunodeficiency, unspecified: Principal | ICD-10-CM

## 2019-10-03 MED FILL — ASPIRIN 81 MG TABLET,DELAYED RELEASE: 90 days supply | Qty: 90 | Fill #2 | Status: AC

## 2019-10-03 MED FILL — CHOLECALCIFEROL (VITAMIN D3) 125 MCG (5,000 UNIT) CAPSULE: ORAL | 100 days supply | Qty: 100 | Fill #2

## 2019-10-03 MED FILL — CHOLECALCIFEROL (VITAMIN D3) 125 MCG (5,000 UNIT) CAPSULE: 100 days supply | Qty: 100 | Fill #2 | Status: AC

## 2019-10-03 MED FILL — ASPIRIN 81 MG TABLET,DELAYED RELEASE: ORAL | 90 days supply | Qty: 90 | Fill #2

## 2019-10-04 LAB — CBC W/ DIFFERENTIAL
BASOPHILS ABSOLUTE COUNT: 0.1 10*3/uL (ref 0.0–0.2)
EOSINOPHILS ABSOLUTE COUNT: 0.1 10*3/uL (ref 0.0–0.4)
EOSINOPHILS RELATIVE PERCENT: 2 %
HEMATOCRIT: 33 % — ABNORMAL LOW (ref 34.0–46.6)
HEMOGLOBIN: 10.6 g/dL — ABNORMAL LOW (ref 11.1–15.9)
LYMPHOCYTES ABSOLUTE COUNT: 0.7 10*3/uL (ref 0.7–3.1)
LYMPHOCYTES RELATIVE PERCENT: 17 %
MEAN CORPUSCULAR HEMOGLOBIN CONC: 32.1 g/dL (ref 31.5–35.7)
MEAN CORPUSCULAR HEMOGLOBIN: 29.7 pg (ref 26.6–33.0)
MEAN CORPUSCULAR VOLUME: 92 fL (ref 79–97)
MONOCYTES ABSOLUTE COUNT: 0.4 10*3/uL (ref 0.1–0.9)
MONOCYTES RELATIVE PERCENT: 10 %
NEUTROPHILS ABSOLUTE COUNT: 2.7 10*3/uL (ref 1.4–7.0)
NEUTROPHILS RELATIVE PERCENT: 69 %
PLATELET COUNT: 235 10*3/uL (ref 150–450)
RED BLOOD CELL COUNT: 3.57 x10E6/uL — ABNORMAL LOW (ref 3.77–5.28)
RED CELL DISTRIBUTION WIDTH: 13.2 % (ref 11.7–15.4)
WHITE BLOOD CELL COUNT: 4 10*3/uL (ref 3.4–10.8)

## 2019-10-04 LAB — RENAL FUNCTION PANEL
ALBUMIN: 4.1 g/dL (ref 3.9–5.0)
BUN / CREAT RATIO: 25 — ABNORMAL HIGH (ref 9–23)
CALCIUM: 9.5 mg/dL (ref 8.7–10.2)
CHLORIDE: 108 mmol/L — ABNORMAL HIGH (ref 96–106)
CO2: 17 mmol/L — ABNORMAL LOW (ref 20–29)
GFR MDRD AF AMER: 85 mL/min/{1.73_m2}
GFR MDRD NON AF AMER: 74 mL/min/{1.73_m2}
GLUCOSE: 93 mg/dL (ref 65–99)
PHOSPHORUS, SERUM: 3.6 mg/dL (ref 3.0–4.3)
SODIUM: 136 mmol/L (ref 134–144)

## 2019-10-04 LAB — NEUTROPHILS RELATIVE PERCENT: Neutrophils/100 leukocytes:NFr:Pt:Bld:Qn:Automated count: 69

## 2019-10-04 LAB — MAGNESIUM: Magnesium:MCnc:Pt:Ser/Plas:Qn:: 1.6

## 2019-10-04 LAB — GLUCOSE: Glucose:MCnc:Pt:Ser/Plas:Qn:: 93

## 2019-10-05 DIAGNOSIS — D849 Immunodeficiency, unspecified: Principal | ICD-10-CM

## 2019-10-05 DIAGNOSIS — Z940 Kidney transplant status: Principal | ICD-10-CM

## 2019-10-05 LAB — TACROLIMUS BLOOD: Tacrolimus:MCnc:Pt:Bld:Qn:LC/MS/MS: 5.7

## 2019-10-06 ENCOUNTER — Ambulatory Visit: Admit: 2019-10-06 | Discharge: 2019-10-07 | Payer: PRIVATE HEALTH INSURANCE

## 2019-10-06 LAB — CMV DNA, QUANTITATIVE, PCR

## 2019-10-06 LAB — LOG10 CMV QN DNA PL

## 2019-10-06 NOTE — Unmapped (Signed)
Returned VM to patient     Left her VM that I would have scheduler reach out to her to get f/u apt scheduled with Dr Margaretmary Bayley for the mid-end of August    Asked her to call with questions or concerns

## 2019-10-09 DIAGNOSIS — D849 Immunodeficiency, unspecified: Principal | ICD-10-CM

## 2019-10-09 DIAGNOSIS — Z940 Kidney transplant status: Principal | ICD-10-CM

## 2019-10-10 DIAGNOSIS — D849 Immunodeficiency, unspecified: Principal | ICD-10-CM

## 2019-10-10 DIAGNOSIS — Z940 Kidney transplant status: Principal | ICD-10-CM

## 2019-10-10 NOTE — Unmapped (Signed)
Dialysis Access Coordinator Note:  Carolyn Kelley, DOB: 01-14-1991  Contact: mychart  Challenges: -  COVID-19 status: vaccinated    Issue/Concern:  Referral received, pt Evaluated by Dr Carolyn Kelley concerns for pseudoaneurysms    Current Dialysis Access:   Left arm AV Fistula creation 11/2011 Dr Carolyn Kelley at [facility], revision 02/2017 Dr Carolyn Kelley    Previous dialysis access history:   unknown    Vessel Mapping/Studies:   Echocardiogram 10/06/2019 [place]  Duplex 09/21/2019 [place]    NOTES:   10/10/2019  Pt was seen and evaluated in clinic on 09/22/2019.   Pt found to have L BB AVF mega-fistula and planning for ligation in progress. Echocardiogram ordered.  Plan  Duplex and Echocardiogram now complete  Offered to schedule AV fistula ligation and she would like to wait till after her transplant one-year anniversary.  Consent to be signed day for surgery.  We will get in touch end of year or she will contact us.   Reminder set.     08/17/2019  Referral received, pt Evaluated by Dr Carolyn Kelley and he has concerns re: thinning skin over her fistula  WQ referral placed for tracking  Request to schedule pt for duplex and in clinic visit on or about 09/21/2019 sent.     PMH: DDKidney transplant 03/10/2019; C3 Glomerulopathy; seizure disorder (last seizure 2014); PEA arrest 03/26/2011 with AKI and anoxic brain injury; HTN  PSH: DDkidney transplant 02/2019  Labs on 08/16/2019: sCr 1.00; GFR 76 post kidney txp  Anticoagulant/Antiplatelet medications: aspirin 81 mg daily; Indications: -; This medication status was reviewed on: 08/17/2019 via chart review    Nephrologist: Carolyn Bailey, MD    Dialysis Center:  [facility] [place]  Dialysis Days:   -    Pt lives in: [place], [place]    [name], [place]  Dialysis Access Coordinator for Dr. Zara Chess of [facility] for Transplant Care  [address], [place], [place] [address]  Desk: [phone]  Fax: [phone]

## 2019-10-11 LAB — RENAL FUNCTION PANEL
ALBUMIN: 4.2 g/dL (ref 3.9–5.0)
BLOOD UREA NITROGEN: 26 mg/dL — ABNORMAL HIGH (ref 6–20)
BUN / CREAT RATIO: 27 — ABNORMAL HIGH (ref 9–23)
CALCIUM: 9.5 mg/dL (ref 8.7–10.2)
CHLORIDE: 106 mmol/L (ref 96–106)
CO2: 20 mmol/L (ref 20–29)
GFR MDRD AF AMER: 92 mL/min/{1.73_m2}
GFR MDRD NON AF AMER: 80 mL/min/{1.73_m2}
GLUCOSE: 92 mg/dL (ref 65–99)
POTASSIUM: 5.1 mmol/L (ref 3.5–5.2)
SODIUM: 136 mmol/L (ref 134–144)

## 2019-10-11 LAB — CBC W/ DIFFERENTIAL
BANDED NEUTROPHILS ABSOLUTE COUNT: 0.1 10*3/uL (ref 0.0–0.1)
BASOPHILS ABSOLUTE COUNT: 0 10*3/uL (ref 0.0–0.2)
BASOPHILS RELATIVE PERCENT: 1 %
EOSINOPHILS ABSOLUTE COUNT: 0.1 10*3/uL (ref 0.0–0.4)
HEMATOCRIT: 33.1 % — ABNORMAL LOW (ref 34.0–46.6)
HEMOGLOBIN: 11 g/dL — ABNORMAL LOW (ref 11.1–15.9)
IMMATURE GRANULOCYTES: 2 %
LYMPHOCYTES ABSOLUTE COUNT: 0.6 10*3/uL — ABNORMAL LOW (ref 0.7–3.1)
LYMPHOCYTES RELATIVE PERCENT: 17 %
MEAN CORPUSCULAR HEMOGLOBIN CONC: 33.2 g/dL (ref 31.5–35.7)
MEAN CORPUSCULAR HEMOGLOBIN: 30.6 pg (ref 26.6–33.0)
MEAN CORPUSCULAR VOLUME: 92 fL (ref 79–97)
MONOCYTES ABSOLUTE COUNT: 0.4 10*3/uL (ref 0.1–0.9)
MONOCYTES RELATIVE PERCENT: 11 %
NEUTROPHILS ABSOLUTE COUNT: 2.6 10*3/uL (ref 1.4–7.0)
NEUTROPHILS RELATIVE PERCENT: 67 %
PLATELET COUNT: 266 10*3/uL (ref 150–450)
RED CELL DISTRIBUTION WIDTH: 13 % (ref 11.7–15.4)
WHITE BLOOD CELL COUNT: 3.8 10*3/uL (ref 3.4–10.8)

## 2019-10-11 LAB — MEAN CORPUSCULAR HEMOGLOBIN CONC: Erythrocyte mean corpuscular hemoglobin concentration:MCnc:Pt:RBC:Qn:Automated count: 33.2

## 2019-10-11 LAB — MAGNESIUM
MAGNESIUM: 1.7 mg/dL (ref 1.6–2.3)
Magnesium:MCnc:Pt:Ser/Plas:Qn:: 1.7

## 2019-10-11 LAB — GFR MDRD AF AMER: Glomerular filtration rate/1.73 sq M.predicted.black:ArVRat:Pt:Ser/Plas/Bld:Qn:Creatinine-based formula (CKD-EPI): 92

## 2019-10-12 DIAGNOSIS — D849 Immunodeficiency, unspecified: Principal | ICD-10-CM

## 2019-10-12 DIAGNOSIS — Z940 Kidney transplant status: Principal | ICD-10-CM

## 2019-10-12 LAB — TACROLIMUS BLOOD: Tacrolimus:MCnc:Pt:Bld:Qn:LC/MS/MS: 11.6

## 2019-10-16 ENCOUNTER — Encounter: Payer: Medicaid Other | Attending: Registered Nurse | Admitting: Registered Nurse

## 2019-10-16 DIAGNOSIS — Z940 Kidney transplant status: Principal | ICD-10-CM

## 2019-10-16 DIAGNOSIS — D849 Immunodeficiency, unspecified: Principal | ICD-10-CM

## 2019-10-17 DIAGNOSIS — Z940 Kidney transplant status: Principal | ICD-10-CM

## 2019-10-17 DIAGNOSIS — D849 Immunodeficiency, unspecified: Principal | ICD-10-CM

## 2019-10-17 MED ORDER — CLONAZEPAM 0.5 MG TABLET
ORAL_TABLET | Freq: Two times a day (BID) | ORAL | 5 refills | 30.00000 days
Start: 2019-10-17 — End: ?

## 2019-10-18 LAB — CBC W/ DIFFERENTIAL
BANDED NEUTROPHILS ABSOLUTE COUNT: 0.1 10*3/uL (ref 0.0–0.1)
BASOPHILS ABSOLUTE COUNT: 0 10*3/uL (ref 0.0–0.2)
BASOPHILS RELATIVE PERCENT: 1 %
EOSINOPHILS ABSOLUTE COUNT: 0.1 10*3/uL (ref 0.0–0.4)
EOSINOPHILS RELATIVE PERCENT: 2 %
HEMATOCRIT: 35 % (ref 34.0–46.6)
IMMATURE GRANULOCYTES: 2 %
LYMPHOCYTES ABSOLUTE COUNT: 0.7 10*3/uL (ref 0.7–3.1)
LYMPHOCYTES RELATIVE PERCENT: 17 %
MEAN CORPUSCULAR HEMOGLOBIN CONC: 31.7 g/dL (ref 31.5–35.7)
MEAN CORPUSCULAR HEMOGLOBIN: 29.5 pg (ref 26.6–33.0)
MEAN CORPUSCULAR VOLUME: 93 fL (ref 79–97)
MONOCYTES ABSOLUTE COUNT: 0.4 10*3/uL (ref 0.1–0.9)
MONOCYTES RELATIVE PERCENT: 10 %
NEUTROPHILS ABSOLUTE COUNT: 2.5 10*3/uL (ref 1.4–7.0)
PLATELET COUNT: 252 10*3/uL (ref 150–450)
RED BLOOD CELL COUNT: 3.76 x10E6/uL — ABNORMAL LOW (ref 3.77–5.28)
RED CELL DISTRIBUTION WIDTH: 13.1 % (ref 11.7–15.4)
WHITE BLOOD CELL COUNT: 3.7 10*3/uL (ref 3.4–10.8)

## 2019-10-18 LAB — RENAL FUNCTION PANEL
ALBUMIN: 4.1 g/dL (ref 3.9–5.0)
BLOOD UREA NITROGEN: 18 mg/dL (ref 6–20)
BUN / CREAT RATIO: 19 (ref 9–23)
CHLORIDE: 104 mmol/L (ref 96–106)
CO2: 21 mmol/L (ref 20–29)
CREATININE: 0.94 mg/dL (ref 0.57–1.00)
GFR MDRD AF AMER: 94 mL/min/{1.73_m2}
GFR MDRD NON AF AMER: 82 mL/min/{1.73_m2}
PHOSPHORUS, SERUM: 3.6 mg/dL (ref 3.0–4.3)
POTASSIUM: 5 mmol/L (ref 3.5–5.2)
SODIUM: 137 mmol/L (ref 134–144)

## 2019-10-18 LAB — MAGNESIUM: Magnesium:MCnc:Pt:Ser/Plas:Qn:: 1.7

## 2019-10-18 LAB — NEUTROPHILS ABSOLUTE COUNT: Neutrophils:NCnc:Pt:Bld:Qn:Automated count: 2.5

## 2019-10-18 LAB — SODIUM: Sodium:SCnc:Pt:Ser/Plas:Qn:: 137

## 2019-10-19 ENCOUNTER — Other Ambulatory Visit: Payer: Self-pay | Admitting: Physical Medicine & Rehabilitation

## 2019-10-19 DIAGNOSIS — G253 Myoclonus: Secondary | ICD-10-CM

## 2019-10-19 DIAGNOSIS — D849 Immunodeficiency, unspecified: Principal | ICD-10-CM

## 2019-10-19 DIAGNOSIS — Z940 Kidney transplant status: Principal | ICD-10-CM

## 2019-10-19 LAB — TACROLIMUS BLOOD: Tacrolimus:MCnc:Pt:Bld:Qn:LC/MS/MS: 5.1

## 2019-10-20 ENCOUNTER — Encounter: Payer: Self-pay | Admitting: *Deleted

## 2019-10-20 LAB — CMV DNA, QUANTITATIVE, PCR: CMV QUANT: NEGATIVE [IU]/mL

## 2019-10-20 LAB — CMV QUANT: Cytomegalovirus DNA:ACnc:Pt:Plas:Qn:Probe.amp.tar: NEGATIVE

## 2019-10-20 MED ORDER — CLONAZEPAM 0.5 MG PO TABS: 0.5000 mg | ORAL_TABLET | Freq: Two times a day (BID) | ORAL | 0 refills | Status: DC

## 2019-10-20 MED ORDER — CLONAZEPAM 0.5 MG TABLET
ORAL_TABLET | Freq: Two times a day (BID) | ORAL | 0 refills | 30.00000 days
Start: 2019-10-20 — End: ?

## 2019-10-20 NOTE — Telephone Encounter (Signed)
PMP was reviewed. Dr Naaman Plummer note was reviewed. Klonopin e-scribe, she must schedule an appointment for future refills. Sybil RN will call Ms. Duchesne regarding the above.

## 2019-10-20 NOTE — Telephone Encounter (Signed)
error 

## 2019-10-20 NOTE — Addendum Note (Signed)
Addended by: Bayard Hugger on: 10/20/2019 05:35 PM   Modules accepted: Orders

## 2019-10-20 NOTE — Telephone Encounter (Signed)
PMP was reviewed. Klonopin being prescribed by Valerie Salts in [place].

## 2019-10-20 NOTE — Unmapped (Signed)
[facility] Refill Coordination Note    Specialty Medication(s) to be Shipped:   Transplant: Envarsus 1mg , Envarsus 4mg  and mycophenolate mofetil 180mg     Other medication(s) to be shipped: magnesium     Carolyn Kelley, DOB: 10-13-1990  Phone: [phone] (home)       All above HIPAA information was verified with patient.     Was a Nurse, learning disability used for this call? No    Completed refill call assessment today to schedule patient's medication shipment from the [facility] [phone]).       Specialty medication(s) and dose(s) confirmed: Regimen is correct and unchanged.   Changes to medications: Adisynn reports no changes at this time.  Changes to insurance: No  Questions for the pharmacist: No    Confirmed patient received Welcome Packet with first shipment. The patient will receive a drug information handout for each medication shipped and additional FDA Medication Guides as required.       DISEASE/MEDICATION-SPECIFIC INFORMATION        N/A    SPECIALTY MEDICATION ADHERENCE     Medication Adherence    Patient reported X missed doses in the last month: 0  Specialty Medication: Envarsus 1mg   Patient is on additional specialty medications: Yes  Additional Specialty Medications: Envarsus 4mg   Patient Reported Additional Medication X Missed Doses in the Last Month: 0  Patient is on more than two specialty medications: Yes  Specialty Medication: Mycophenolate 180mg   Patient Reported Additional Medication X Missed Doses in the Last Month: 0        Envarsus 1 mg: 6 days of medicine on hand   Envarsus 4 mg: 6 days of medicine on hand   Mycophenolate 180 mg: 6 days of medicine on hand     SHIPPING     Shipping address confirmed in Epic.     Delivery Scheduled: Yes, Expected medication delivery date: 10/26/2019.     Medication will be delivered via UPS to the prescription address in Epic WAM.    [name] [facility] Specialty Technician

## 2019-10-23 ENCOUNTER — Telehealth: Payer: Self-pay

## 2019-10-23 DIAGNOSIS — G253 Myoclonus: Secondary | ICD-10-CM

## 2019-10-23 DIAGNOSIS — D849 Immunodeficiency, unspecified: Principal | ICD-10-CM

## 2019-10-23 DIAGNOSIS — Z940 Kidney transplant status: Principal | ICD-10-CM

## 2019-10-23 MED ORDER — CLONAZEPAM 0.5 MG PO TABS: 0.5000 mg | ORAL_TABLET | Freq: Two times a day (BID) | ORAL | 0 refills | Status: DC

## 2019-10-23 MED ORDER — CLONAZEPAM 0.5 MG TABLET: 1 mg | tablet | Freq: Two times a day (BID) | 0 refills | 30 days

## 2019-10-23 NOTE — Telephone Encounter (Signed)
Clonazepam Rx sent to [facility] cancelled. Patient request Rx to be sent to local pharmacy.

## 2019-10-23 NOTE — Addendum Note (Signed)
Addended by: Bayard Hugger on: 10/23/2019 10:08 AM   Modules accepted: Orders

## 2019-10-24 DIAGNOSIS — Z940 Kidney transplant status: Principal | ICD-10-CM

## 2019-10-24 DIAGNOSIS — D849 Immunodeficiency, unspecified: Principal | ICD-10-CM

## 2019-10-25 ENCOUNTER — Encounter: Payer: Self-pay | Admitting: Registered Nurse

## 2019-10-25 ENCOUNTER — Other Ambulatory Visit: Payer: Self-pay

## 2019-10-25 ENCOUNTER — Encounter: Payer: Medicaid Other | Attending: Registered Nurse | Admitting: Registered Nurse

## 2019-10-25 VITALS — BP 101/56 | HR 88 | Temp 98.4°F | Ht 62.0 in | Wt 159.0 lb

## 2019-10-25 DIAGNOSIS — G253 Myoclonus: Secondary | ICD-10-CM

## 2019-10-25 DIAGNOSIS — R269 Unspecified abnormalities of gait and mobility: Secondary | ICD-10-CM | POA: Diagnosis not present

## 2019-10-25 LAB — CBC W/ DIFFERENTIAL
BASOPHILS ABSOLUTE COUNT: 0.1 10*3/uL (ref 0.0–0.2)
BASOPHILS RELATIVE PERCENT: 1 %
EOSINOPHILS ABSOLUTE COUNT: 0.1 10*3/uL (ref 0.0–0.4)
EOSINOPHILS RELATIVE PERCENT: 2 %
HEMOGLOBIN: 11.1 g/dL (ref 11.1–15.9)
IMMATURE GRANULOCYTES: 1 %
LYMPHOCYTES ABSOLUTE COUNT: 0.6 10*3/uL — ABNORMAL LOW (ref 0.7–3.1)
LYMPHOCYTES RELATIVE PERCENT: 16 %
MEAN CORPUSCULAR HEMOGLOBIN: 29.1 pg (ref 26.6–33.0)
MEAN CORPUSCULAR VOLUME: 91 fL (ref 79–97)
MONOCYTES ABSOLUTE COUNT: 0.4 10*3/uL (ref 0.1–0.9)
MONOCYTES RELATIVE PERCENT: 11 %
NEUTROPHILS ABSOLUTE COUNT: 2.5 10*3/uL (ref 1.4–7.0)
NEUTROPHILS RELATIVE PERCENT: 69 %
PLATELET COUNT: 234 10*3/uL (ref 150–450)
RED BLOOD CELL COUNT: 3.82 x10E6/uL (ref 3.77–5.28)
RED CELL DISTRIBUTION WIDTH: 13.2 % (ref 11.7–15.4)
WHITE BLOOD CELL COUNT: 3.7 10*3/uL (ref 3.4–10.8)

## 2019-10-25 LAB — PROTEIN / CREATININE RATIO, URINE: PROTEIN URINE: 148.9 mg/dL

## 2019-10-25 LAB — RENAL FUNCTION PANEL
ALBUMIN: 4.2 g/dL (ref 3.9–5.0)
BLOOD UREA NITROGEN: 23 mg/dL — ABNORMAL HIGH (ref 6–20)
BUN / CREAT RATIO: 23 (ref 9–23)
CALCIUM: 9.9 mg/dL (ref 8.7–10.2)
CHLORIDE: 104 mmol/L (ref 96–106)
CO2: 20 mmol/L (ref 20–29)
GFR MDRD AF AMER: 88 mL/min/{1.73_m2}
PHOSPHORUS, SERUM: 4.1 mg/dL (ref 3.0–4.3)
POTASSIUM: 4.9 mmol/L (ref 3.5–5.2)

## 2019-10-25 LAB — IMMATURE GRANULOCYTES: Granulocytes.immature/100 leukocytes:NFr:Pt:Bld:Qn:Automated count: 1

## 2019-10-25 LAB — GFR MDRD AF AMER: Glomerular filtration rate/1.73 sq M.predicted.black:ArVRat:Pt:Ser/Plas/Bld:Qn:Creatinine-based formula (CKD-EPI): 88

## 2019-10-25 LAB — MAGNESIUM: Magnesium:MCnc:Pt:Ser/Plas:Qn:: 1.8

## 2019-10-25 LAB — PROTEIN/CREAT RATIO: Protein/Creatinine:MRto:Pt:Urine:Qn:: 1756 — ABNORMAL HIGH

## 2019-10-25 MED FILL — MYCOPHENOLATE SODIUM 180 MG TABLET,DELAYED RELEASE: 30 days supply | Qty: 180 | Fill #7 | Status: AC

## 2019-10-25 MED FILL — ENVARSUS XR 1 MG TABLET,EXTENDED RELEASE: 30 days supply | Qty: 30 | Fill #1 | Status: AC

## 2019-10-25 MED FILL — MYCOPHENOLATE SODIUM 180 MG TABLET,DELAYED RELEASE: ORAL | 30 days supply | Qty: 180 | Fill #7

## 2019-10-25 MED FILL — MG-PLUS-PROTEIN 133 MG TABLET: ORAL | 30 days supply | Qty: 120 | Fill #7

## 2019-10-25 MED FILL — ENVARSUS XR 1 MG TABLET,EXTENDED RELEASE: 30 days supply | Qty: 30 | Fill #1

## 2019-10-25 MED FILL — MG-PLUS-PROTEIN 133 MG TABLET: 30 days supply | Qty: 120 | Fill #7 | Status: AC

## 2019-10-25 MED FILL — ENVARSUS XR 4 MG TABLET,EXTENDED RELEASE: 30 days supply | Qty: 60 | Fill #1

## 2019-10-25 MED FILL — ENVARSUS XR 4 MG TABLET,EXTENDED RELEASE: 30 days supply | Qty: 60 | Fill #1 | Status: AC

## 2019-10-25 NOTE — Progress Notes (Signed)
Subjective:    Patient ID: Heather Sims, female    DOB: 1991/02/20, 29 y.o.   MRN: [id number]  HPI: Heather Sims is a 29 y.o. female who returns for follow up appointment of her Anoxic Brain Injury after cardiorespiratory arrest, Myoclonus and Gait disorder and  medication refill. She states her pain is located in her right foot at times. She rates her pain 3. Her current exercise regime is walking one mile a day three times a day  ( 3 miles per day).   [name] asked for a PhysicalTherapy referral, she would like to be able to walk with a cane, referral placed for Physical Therapy. She verbalizes understanding.   Pain Inventory Average Pain 3 Pain Right Now 3 My pain is aching  In the last 24 hours, has pain interfered with the following? General activity 2 Relation with others 0 Enjoyment of life 0 What TIME of day is your pain at its worst? morning, evening, night Sleep (in general) fair to good  Pain is worse with: standing Pain improves with: rest Relief from Meds: no pain meds  Mobility walk with assistance use a walker  Function disabled: date disabled .  Neuro/Psych trouble walking  Prior Studies Any changes since last visit?  no  Physicians involved in your care Any changes since last visit?  no   Family History  Problem Relation Age of Onset  . Hypertension Mother   . Diabetes Father   . Hyperlipidemia Father    Social History   Socioeconomic History  . Marital status: Single    Spouse name: Not on file  . Number of children: 0  . Years of education: college jr  . Highest education level: Not on file  Occupational History  . Not on file  Tobacco Use  . Smoking status: Never Smoker  . Smokeless tobacco: Never Used  Vaping Use  . Vaping Use: Never used  Substance and Sexual Activity  . Alcohol use: No    Alcohol/week: 0.0 standard drinks  . Drug use: No  . Sexual activity: Never    Birth control/protection: Abstinence  Other  Topics Concern  . Not on file  Social History Narrative   Lives at home with her mother & brother   Right handed      ** Merged History Encounter **       Social Determinants of Health   Financial Resource Strain:   . Difficulty of Paying Living Expenses:   Food Insecurity:   . Worried About Charity fundraiser in the Last Year:   . Arboriculturist in the Last Year:   Transportation Needs:   . Film/video editor (Medical):   Marland Kitchen Lack of Transportation (Non-Medical):   Physical Activity:   . Days of Exercise per Week:   . Minutes of Exercise per Session:   Stress:   . Feeling of Stress :   Social Connections:   . Frequency of Communication with Friends and Family:   . Frequency of Social Gatherings with Friends and Family:   . Attends Religious Services:   . Active Member of Clubs or Organizations:   . Attends Archivist Meetings:   Marland Kitchen Marital Status:    Past Surgical History:  Procedure Laterality Date  . AV FISTULA PLACEMENT  12/18/2011   Procedure: ARTERIOVENOUS (AV) FISTULA CREATION;  Surgeon: Angelia Mould, MD;  Location: [place];  Service: Vascular;  Laterality: Left;  . INSERTION OF DIALYSIS  CATHETER Right 08/06/2014   Procedure: INSERTION OF DIALYSIS CATHETER;  Surgeon: Elam Dutch, MD;  Location: [place];  Service: Vascular;  Laterality: Right;  . RENAL BIOPSY    . REVISON OF ARTERIOVENOUS FISTULA Left 3/53/2992   Procedure: PLICATION OF ARTERIOVENOUS FISTULA;  Surgeon: Elam Dutch, MD;  Location: [place];  Service: Vascular;  Laterality: Left;  . REVISON OF ARTERIOVENOUS FISTULA Left 03/12/2017   Procedure: REVISION PLICATION OF ARTERIOVENOUS FISTULA LEFT UPPER ARM;  Surgeon: Serafina Mitchell, MD;  [facility];  Service: Vascular;  Laterality: Left;   Past Medical History:  Diagnosis Date  . Anemia   . Brain injury ([place])   . Cardiac arrest ([place]) 03/26/2011  . Cardiomyopathy   . CHF (congestive heart failure) ([place])   . Chronic kidney  disease    [facility]- [place]  . Depression   . Family history of adverse reaction to anesthesia    Mother hard to wake up.  . Gait disorder 01/11/2013  . Heart failure   . History of blood transfusion 11/2011  . Hypertension   . Ileitis   . MPGN (membranoproliferative glomerulonephritis), type 2   . Pneumonia    'walking pneumonia'  . Renal disorder   . Seizures ([place])    2013- last time   BP (!) 101/56   Pulse 88   Temp 98.4 F (36.9 C)   Ht 5\' 2"  (1.575 m)   Wt 159 lb (72.1 kg)   SpO2 97%   BMI 29.08 kg/m   Opioid Risk Score:   Fall Risk Score:  `1  Depression screen PHQ 2/9  Depression screen [facility] 2/9 10/18/2017 07/02/2014  Decreased Interest 1 1  Down, Depressed, Hopeless 1 1  PHQ - 2 Score 2 2  Altered sleeping - 1  Tired, decreased energy - 1  Change in appetite - 0  Feeling bad or failure about yourself  - 2  Trouble concentrating - 0  Moving slowly or fidgety/restless - 0  Suicidal thoughts - 0  PHQ-9 Score - 6    Review of Systems  Constitutional: Negative.   HENT: Negative.   Eyes: Negative.   Respiratory: Negative.   Cardiovascular: Negative.   Gastrointestinal: Negative.   Endocrine: Negative.   Genitourinary: Negative.   Musculoskeletal: Positive for gait problem and myalgias.       Pain left lower calf/ankle  Skin: Negative.   Allergic/Immunologic: Negative.   Hematological: Negative.   Psychiatric/Behavioral: Negative.   All other systems reviewed and are negative.      Objective:   Physical Exam Vitals and nursing note reviewed.  Constitutional:      Appearance: Normal appearance.  Cardiovascular:     Rate and Rhythm: Normal rate and regular rhythm.     Pulses: Normal pulses.     Heart sounds: Normal heart sounds.  Pulmonary:     Effort: Pulmonary effort is normal.     Breath sounds: Normal breath sounds.  Musculoskeletal:     Cervical back: Normal range of motion and neck supple.     Comments: Normal Muscle Bulk and Muscle  Testing Reveals:  Upper Extremities: Full ROM and Muscle Strength 5/5  Lower Extremities: Full ROM and Muscle Strength 5/5 Arises from chair with ease using walker for support Narrow Based Gait   Skin:    General: Skin is warm and dry.  Neurological:     Mental Status: She is alert and oriented to person, place, and time.  Psychiatric:  Mood and Affect: Mood normal.        Behavior: Behavior normal.           Assessment & Plan:  1. 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  Continue to monitor.  2. Myoclonus: Continue Klonopin 0.5 mg BID. If your Nephrologist will prescribe your Klonopin, you can come to our office prn. Instructed to call office with update in three weeks, she verbalizes understanding.  We will continue the opioid monitoring program, this consists of regular clinic visits, examinations, urine drug screen, pill counts as well as use of [place] Controlled Substance Reporting system. 3. Gait Disorder: RX: Physical Therapy: Continue walking with walker at all times.  4. Seizure Disorder: Neurology Following. Continue to Monitor.  5. ESRD: S/P Transplant: Nephrology Following.   20  minutes of face to face patient care time was spent during this visit. All questions were encouraged and answered.  F/U in 1 year

## 2019-10-25 NOTE — Patient Instructions (Signed)
If your Nephrologist take over prescribing your Klonopin, call office or send My-Chart Message with update.   You can come to our office as needed if they will take over prescribing the [place].

## 2019-10-26 DIAGNOSIS — D849 Immunodeficiency, unspecified: Principal | ICD-10-CM

## 2019-10-26 DIAGNOSIS — Z940 Kidney transplant status: Principal | ICD-10-CM

## 2019-10-26 LAB — TACROLIMUS BLOOD: Tacrolimus:MCnc:Pt:Bld:Qn:LC/MS/MS: 6.4

## 2019-10-30 DIAGNOSIS — Z940 Kidney transplant status: Principal | ICD-10-CM

## 2019-10-30 DIAGNOSIS — D849 Immunodeficiency, unspecified: Principal | ICD-10-CM

## 2019-11-01 LAB — CBC W/ DIFFERENTIAL
BANDED NEUTROPHILS ABSOLUTE COUNT: 0.1 10*3/uL (ref 0.0–0.1)
BASOPHILS ABSOLUTE COUNT: 0 10*3/uL (ref 0.0–0.2)
BASOPHILS RELATIVE PERCENT: 1 %
EOSINOPHILS ABSOLUTE COUNT: 0.1 10*3/uL (ref 0.0–0.4)
HEMATOCRIT: 34.8 % (ref 34.0–46.6)
HEMOGLOBIN: 11.1 g/dL (ref 11.1–15.9)
IMMATURE GRANULOCYTES: 2 %
LYMPHOCYTES ABSOLUTE COUNT: 0.6 10*3/uL — ABNORMAL LOW (ref 0.7–3.1)
MEAN CORPUSCULAR HEMOGLOBIN: 29.4 pg (ref 26.6–33.0)
MEAN CORPUSCULAR VOLUME: 92 fL (ref 79–97)
MONOCYTES ABSOLUTE COUNT: 0.5 10*3/uL (ref 0.1–0.9)
MONOCYTES RELATIVE PERCENT: 10 %
NEUTROPHILS ABSOLUTE COUNT: 3.3 10*3/uL (ref 1.4–7.0)
NEUTROPHILS RELATIVE PERCENT: 71 %
PLATELET COUNT: 226 10*3/uL (ref 150–450)
RED BLOOD CELL COUNT: 3.77 x10E6/uL (ref 3.77–5.28)
RED CELL DISTRIBUTION WIDTH: 13.4 % (ref 11.7–15.4)
WHITE BLOOD CELL COUNT: 4.6 10*3/uL (ref 3.4–10.8)

## 2019-11-01 LAB — RENAL FUNCTION PANEL
ALBUMIN: 3.8 g/dL — ABNORMAL LOW (ref 3.9–5.0)
BLOOD UREA NITROGEN: 28 mg/dL — ABNORMAL HIGH (ref 6–20)
BUN / CREAT RATIO: 28 — ABNORMAL HIGH (ref 9–23)
CALCIUM: 9.8 mg/dL (ref 8.7–10.2)
CHLORIDE: 103 mmol/L (ref 96–106)
CO2: 20 mmol/L (ref 20–29)
CREATININE: 0.99 mg/dL (ref 0.57–1.00)
GFR MDRD AF AMER: 88 mL/min/{1.73_m2}
GFR MDRD NON AF AMER: 77 mL/min/{1.73_m2}
GLUCOSE: 94 mg/dL (ref 65–99)
POTASSIUM: 4.9 mmol/L (ref 3.5–5.2)
SODIUM: 136 mmol/L (ref 134–144)

## 2019-11-01 LAB — NEUTROPHILS ABSOLUTE COUNT: Neutrophils:NCnc:Pt:Bld:Qn:Automated count: 3.3

## 2019-11-01 LAB — MAGNESIUM: Magnesium:MCnc:Pt:Ser/Plas:Qn:: 1.6

## 2019-11-01 LAB — ALBUMIN: Albumin:MCnc:Pt:Ser/Plas:Qn:: 3.8 — ABNORMAL LOW

## 2019-11-02 LAB — TACROLIMUS BLOOD: Tacrolimus:MCnc:Pt:Bld:Qn:LC/MS/MS: 7.5

## 2019-11-03 LAB — CMV QUANT: Cytomegalovirus DNA:ACnc:Pt:Plas:Qn:Probe.amp.tar: NEGATIVE

## 2019-11-03 LAB — CMV DNA, QUANTITATIVE, PCR

## 2019-11-03 NOTE — Unmapped (Signed)
Patient called to ask if we could manage her Klonipin.    Let her know that she should call her PCP to manage but if she is unable to get it to let me know.    Verbalized understanding and denies any other needs

## 2019-11-08 LAB — CBC W/ DIFFERENTIAL
BANDED NEUTROPHILS ABSOLUTE COUNT: 0.1 10*3/uL (ref 0.0–0.1)
BASOPHILS ABSOLUTE COUNT: 0 10*3/uL (ref 0.0–0.2)
BASOPHILS RELATIVE PERCENT: 1 %
EOSINOPHILS ABSOLUTE COUNT: 0.1 10*3/uL (ref 0.0–0.4)
EOSINOPHILS RELATIVE PERCENT: 2 %
HEMATOCRIT: 34.1 % (ref 34.0–46.6)
HEMOGLOBIN: 11.4 g/dL (ref 11.1–15.9)
IMMATURE GRANULOCYTES: 2 %
LYMPHOCYTES RELATIVE PERCENT: 16 %
MEAN CORPUSCULAR HEMOGLOBIN CONC: 33.4 g/dL (ref 31.5–35.7)
MEAN CORPUSCULAR HEMOGLOBIN: 30.2 pg (ref 26.6–33.0)
MEAN CORPUSCULAR VOLUME: 91 fL (ref 79–97)
MONOCYTES ABSOLUTE COUNT: 0.5 10*3/uL (ref 0.1–0.9)
MONOCYTES RELATIVE PERCENT: 10 %
NEUTROPHILS RELATIVE PERCENT: 69 %
PLATELET COUNT: 240 10*3/uL (ref 150–450)
RED BLOOD CELL COUNT: 3.77 x10E6/uL (ref 3.77–5.28)
RED CELL DISTRIBUTION WIDTH: 12.9 % (ref 11.7–15.4)
WHITE BLOOD CELL COUNT: 4.7 10*3/uL (ref 3.4–10.8)

## 2019-11-08 LAB — RENAL FUNCTION PANEL
ALBUMIN: 4.2 g/dL (ref 3.9–5.0)
BUN / CREAT RATIO: 26 — ABNORMAL HIGH (ref 9–23)
CALCIUM: 9.6 mg/dL (ref 8.7–10.2)
CHLORIDE: 103 mmol/L (ref 96–106)
CO2: 18 mmol/L — ABNORMAL LOW (ref 20–29)
CREATININE: 1.12 mg/dL — ABNORMAL HIGH (ref 0.57–1.00)
GFR MDRD AF AMER: 76 mL/min/{1.73_m2}
GFR MDRD NON AF AMER: 66 mL/min/{1.73_m2}
GLUCOSE: 91 mg/dL (ref 65–99)
PHOSPHORUS, SERUM: 3.7 mg/dL (ref 3.0–4.3)
POTASSIUM: 4.9 mmol/L (ref 3.5–5.2)
SODIUM: 135 mmol/L (ref 134–144)

## 2019-11-08 LAB — MAGNESIUM: Magnesium:MCnc:Pt:Ser/Plas:Qn:: 1.7

## 2019-11-08 LAB — BASOPHILS RELATIVE PERCENT: Basophils/100 leukocytes:NFr:Pt:Bld:Qn:Automated count: 1

## 2019-11-08 LAB — CREATININE: Creatinine:MCnc:Pt:Ser/Plas:Qn:: 1.12 — ABNORMAL HIGH

## 2019-11-08 MED FILL — SODIUM BICARBONATE 650 MG TABLET: ORAL | 30 days supply | Qty: 60 | Fill #1

## 2019-11-08 MED FILL — SODIUM BICARBONATE 650 MG TABLET: 30 days supply | Qty: 60 | Fill #1 | Status: AC

## 2019-11-09 LAB — TACROLIMUS BLOOD: Tacrolimus:MCnc:Pt:Bld:Qn:LC/MS/MS: 16.4

## 2019-11-13 DIAGNOSIS — Z940 Kidney transplant status: Principal | ICD-10-CM

## 2019-11-13 NOTE — Unmapped (Signed)
[facility] Clinical Assessment & Refill Coordination Note    Carolyn Kelley, DOB: September 27, 1990  Phone: [phone] (home)     All above HIPAA information was verified with patient.     Was a Nurse, learning disability used for this call? No    Specialty Medication(s):   Transplant: Envarsus 1mg , Envarsus 4mg  and  mycophenolic acid 180mg      Current Outpatient Medications   Medication Sig Dispense Refill   ??? acetaminophen (TYLENOL) 500 MG tablet Take 1-2 tablets (500-1,000 mg total) by mouth every six (6) hours as needed for pain or fever (> 38C). 100 tablet 0   ??? aspirin (ECOTRIN) 81 MG tablet Take 1 tablet (81 mg total) by mouth daily. 30 tablet 11   ??? cholecalciferol, vitamin D3-125 mcg, 5,000 unit,, 125 mcg (5,000 unit) capsule Take 1 capsule (125 mcg total) by mouth daily. 100 capsule 11   ??? clonazePAM (KLONOPIN) 0.5 MG tablet Take 1 tablet (0.5 mg total) by mouth two (2) times a day. Needs appointment for further refills. 60 tablet 0   ??? levETIRAcetam (KEPPRA) 250 MG tablet TAKE 1 TABLET BY MOUTH TWICE DAILY. 180 tablet 3   ??? magnesium oxide-Mg AA chelate (MAGNESIUM, AMINO ACID CHELATE,) 133 mg Tab Take 2 tablets by mouth Two (2) times a day. 120 tablet 11   ??? mycophenolate (MYFORTIC) 180 MG EC tablet Take 3 tablets (540 mg total) by mouth Two (2) times a day. Adjust dose per medication card. 180 tablet 11   ??? sodium bicarbonate 650 mg tablet Take 1 tablet (650 mg total) by mouth Two (2) times a day. 60 tablet 11   ??? tacrolimus (ENVARSUS XR) 1 mg Tb24 extended release tablet Take ONE (1mg ) tablet with TWO (4mg ) tablets by mouth daily for a total daily dose of 9mg . 30 tablet 11   ??? tacrolimus (ENVARSUS XR) 4 mg Tb24 extended release tablet Take TWO (4mg ) tablets with ONE (1mg ) tablet by mouth daily for a total daily dose of 9mg . 90 tablet 11     No current facility-administered medications for this visit.        Changes to medications: Cayleigh reports no changes at this time.    Allergies Allergen Reactions   ??? Bee Pollen Swelling     Sneezing, congestion  denies swelling.  Sneezing, congestion  denies swelling.     ??? Pollen Extracts Swelling       Changes to allergies: No    SPECIALTY MEDICATION ADHERENCE     envarsus 1mg   : 12 days of medicine on hand   envarsus 4mg   : 12 days of medicine on hand   Mycophenolate 180mg   : 12 days of medicine on hand       Medication Adherence    Patient reported X missed doses in the last month: 0  Specialty Medication: envarsus 1mg   Patient is on additional specialty medications: Yes  Additional Specialty Medications: envarsus 4mg   Patient Reported Additional Medication X Missed Doses in the Last Month: 0  Patient is on more than two specialty medications: Yes  Specialty Medication: mycophenolate 180mg   Patient Reported Additional Medication X Missed Doses in the Last Month: 0          Specialty medication(s) dose(s) confirmed: Regimen is correct and unchanged.     Are there any concerns with adherence? No    Adherence counseling provided? Not needed    CLINICAL MANAGEMENT AND INTERVENTION      Clinical Benefit Assessment:  Do you feel the medicine is effective or helping your condition? Yes    Clinical Benefit counseling provided? Not needed    Adverse Effects Assessment:    Are you experiencing any side effects? No    Are you experiencing difficulty administering your medicine? No    Quality of Life Assessment:    How many days over the past month did your transplant  keep you from your normal activities? For example, brushing your teeth or getting up in the morning. 0    Have you discussed this with your provider? Not needed    Therapy Appropriateness:    Is therapy appropriate? Yes, therapy is appropriate and should be continued    DISEASE/MEDICATION-SPECIFIC INFORMATION      N/A    PATIENT SPECIFIC NEEDS     - Does the patient have any physical, cognitive, or cultural barriers? No    - Is the patient high risk? Yes, patient is taking a REMS drug. Medication is dispensed in compliance with REMS program    - Does the patient require a Care Management Plan? No     - Does the patient require physician intervention or other additional services (i.e. nutrition, smoking cessation, social work)? No      SHIPPING     Specialty Medication(s) to be Shipped:   Transplant: Envarsus 1mg , Envarsus 4mg  and  mycophenolic acid 180mg     Other medication(s) to be shipped: mgplus, clonazepam     Changes to insurance: No    Delivery Scheduled: Yes, Expected medication delivery date: 8/26 for clonazepam, 8/31 for all else: envarsus 1 and 4, mycophen, and mgplus.     Medication will be delivered via UPS to the confirmed prescription address in [facility].    The patient will receive a drug information handout for each medication shipped and additional FDA Medication Guides as required.  Verified that patient has previously received a Conservation officer, historic buildings.    All of the patient's questions and concerns have been addressed.    Thad Ranger   [facility] Specialty Pharmacist

## 2019-11-15 LAB — CBC W/ DIFFERENTIAL
BANDED NEUTROPHILS ABSOLUTE COUNT: 0.1 10*3/uL (ref 0.0–0.1)
BASOPHILS ABSOLUTE COUNT: 0 10*3/uL (ref 0.0–0.2)
BASOPHILS RELATIVE PERCENT: 1 %
EOSINOPHILS ABSOLUTE COUNT: 0.1 10*3/uL (ref 0.0–0.4)
EOSINOPHILS RELATIVE PERCENT: 2 %
HEMATOCRIT: 33.9 % — ABNORMAL LOW (ref 34.0–46.6)
HEMOGLOBIN: 11 g/dL — ABNORMAL LOW (ref 11.1–15.9)
IMMATURE GRANULOCYTES: 2 %
LYMPHOCYTES ABSOLUTE COUNT: 0.8 10*3/uL (ref 0.7–3.1)
LYMPHOCYTES RELATIVE PERCENT: 18 %
MEAN CORPUSCULAR HEMOGLOBIN CONC: 32.4 g/dL (ref 31.5–35.7)
MEAN CORPUSCULAR HEMOGLOBIN: 29.4 pg (ref 26.6–33.0)
MEAN CORPUSCULAR VOLUME: 91 fL (ref 79–97)
MONOCYTES ABSOLUTE COUNT: 0.4 10*3/uL (ref 0.1–0.9)
MONOCYTES RELATIVE PERCENT: 10 %
NEUTROPHILS ABSOLUTE COUNT: 3 10*3/uL (ref 1.4–7.0)
NEUTROPHILS RELATIVE PERCENT: 67 %
PLATELET COUNT: 262 10*3/uL (ref 150–450)
RED BLOOD CELL COUNT: 3.74 x10E6/uL — ABNORMAL LOW (ref 3.77–5.28)
RED CELL DISTRIBUTION WIDTH: 13 % (ref 11.7–15.4)
WHITE BLOOD CELL COUNT: 4.4 10*3/uL (ref 3.4–10.8)

## 2019-11-15 LAB — RENAL FUNCTION PANEL
ALBUMIN: 4.3 g/dL (ref 3.9–5.0)
BLOOD UREA NITROGEN: 32 mg/dL — ABNORMAL HIGH (ref 6–20)
BUN / CREAT RATIO: 33 — ABNORMAL HIGH (ref 9–23)
CALCIUM: 9.8 mg/dL (ref 8.7–10.2)
CHLORIDE: 108 mmol/L — ABNORMAL HIGH (ref 96–106)
CO2: 17 mmol/L — ABNORMAL LOW (ref 20–29)
CREATININE: 0.98 mg/dL (ref 0.57–1.00)
GFR MDRD AF AMER: 90 mL/min/{1.73_m2}
GFR MDRD NON AF AMER: 78 mL/min/{1.73_m2}
GLUCOSE: 99 mg/dL (ref 65–99)
PHOSPHORUS, SERUM: 4.1 mg/dL (ref 3.0–4.3)
POTASSIUM: 5 mmol/L (ref 3.5–5.2)
SODIUM: 138 mmol/L (ref 134–144)

## 2019-11-15 LAB — MAGNESIUM: MAGNESIUM: 1.8 mg/dL (ref 1.6–2.3)

## 2019-11-15 MED FILL — CLONAZEPAM 0.5 MG TABLET: 30 days supply | Qty: 60 | Fill #0 | Status: AC

## 2019-11-15 MED FILL — CLONAZEPAM 0.5 MG TABLET: ORAL | 30 days supply | Qty: 60 | Fill #0

## 2019-11-16 LAB — TACROLIMUS LEVEL: TACROLIMUS BLOOD: 7.6 ng/mL (ref 2.0–20.0)

## 2019-11-17 LAB — CMV DNA, QUANTITATIVE, PCR: CMV QUANT: NEGATIVE [IU]/mL

## 2019-11-20 DIAGNOSIS — Z940 Kidney transplant status: Principal | ICD-10-CM

## 2019-11-20 MED FILL — MG-PLUS-PROTEIN 133 MG TABLET: 30 days supply | Qty: 120 | Fill #8 | Status: AC

## 2019-11-20 MED FILL — ENVARSUS XR 4 MG TABLET,EXTENDED RELEASE: 30 days supply | Qty: 60 | Fill #2 | Status: AC

## 2019-11-20 MED FILL — MG-PLUS-PROTEIN 133 MG TABLET: ORAL | 30 days supply | Qty: 120 | Fill #8

## 2019-11-20 MED FILL — ENVARSUS XR 1 MG TABLET,EXTENDED RELEASE: 30 days supply | Qty: 30 | Fill #2 | Status: AC

## 2019-11-20 MED FILL — MYCOPHENOLATE SODIUM 180 MG TABLET,DELAYED RELEASE: ORAL | 30 days supply | Qty: 180 | Fill #8

## 2019-11-20 MED FILL — MYCOPHENOLATE SODIUM 180 MG TABLET,DELAYED RELEASE: 30 days supply | Qty: 180 | Fill #8 | Status: AC

## 2019-11-20 MED FILL — ENVARSUS XR 1 MG TABLET,EXTENDED RELEASE: 30 days supply | Qty: 30 | Fill #2

## 2019-11-20 MED FILL — ENVARSUS XR 4 MG TABLET,EXTENDED RELEASE: 30 days supply | Qty: 60 | Fill #2

## 2019-11-22 LAB — RENAL FUNCTION PANEL
ALBUMIN: 4.2 g/dL (ref 3.9–5.0)
BLOOD UREA NITROGEN: 23 mg/dL — ABNORMAL HIGH (ref 6–20)
BUN / CREAT RATIO: 23 (ref 9–23)
CALCIUM: 10 mg/dL (ref 8.7–10.2)
CO2: 21 mmol/L (ref 20–29)
CREATININE: 1.01 mg/dL — ABNORMAL HIGH (ref 0.57–1.00)
GFR MDRD NON AF AMER: 75 mL/min/{1.73_m2}
GLUCOSE: 94 mg/dL (ref 65–99)
PHOSPHORUS, SERUM: 4.1 mg/dL (ref 3.0–4.3)
POTASSIUM: 5 mmol/L (ref 3.5–5.2)
SODIUM: 138 mmol/L (ref 134–144)

## 2019-11-22 LAB — CBC W/ DIFFERENTIAL
BANDED NEUTROPHILS ABSOLUTE COUNT: 0.1 10*3/uL (ref 0.0–0.1)
BASOPHILS ABSOLUTE COUNT: 0 10*3/uL (ref 0.0–0.2)
BASOPHILS RELATIVE PERCENT: 1 %
EOSINOPHILS ABSOLUTE COUNT: 0.1 10*3/uL (ref 0.0–0.4)
HEMOGLOBIN: 10.9 g/dL — ABNORMAL LOW (ref 11.1–15.9)
IMMATURE GRANULOCYTES: 2 %
LYMPHOCYTES ABSOLUTE COUNT: 0.7 10*3/uL (ref 0.7–3.1)
LYMPHOCYTES RELATIVE PERCENT: 18 %
MEAN CORPUSCULAR HEMOGLOBIN CONC: 32.4 g/dL (ref 31.5–35.7)
MEAN CORPUSCULAR HEMOGLOBIN: 30.1 pg (ref 26.6–33.0)
MEAN CORPUSCULAR VOLUME: 93 fL (ref 79–97)
MONOCYTES ABSOLUTE COUNT: 0.3 10*3/uL (ref 0.1–0.9)
MONOCYTES RELATIVE PERCENT: 9 %
NEUTROPHILS ABSOLUTE COUNT: 2.6 10*3/uL (ref 1.4–7.0)
NEUTROPHILS RELATIVE PERCENT: 67 %
PLATELET COUNT: 245 10*3/uL (ref 150–450)
RED BLOOD CELL COUNT: 3.62 x10E6/uL — ABNORMAL LOW (ref 3.77–5.28)
RED CELL DISTRIBUTION WIDTH: 13.1 % (ref 11.7–15.4)

## 2019-11-22 LAB — PROTEIN/CREAT RATIO: Protein/Creatinine:MRto:Pt:Urine:Qn:: 145

## 2019-11-22 LAB — MAGNESIUM: Magnesium:MCnc:Pt:Ser/Plas:Qn:: 1.6

## 2019-11-22 LAB — NEUTROPHILS ABSOLUTE COUNT: Neutrophils:NCnc:Pt:Bld:Qn:Automated count: 2.6

## 2019-11-22 LAB — TACROLIMUS BLOOD: Tacrolimus:MCnc:Pt:Bld:Qn:LC/MS/MS: 5.7

## 2019-11-22 LAB — PROTEIN / CREATININE RATIO, URINE
CREATININE URINE: 123.3 mg/dL
PROTEIN URINE: 17.9 mg/dL
PROTEIN/CREAT RATIO: 145 mg/g{creat} (ref 0–200)

## 2019-11-22 LAB — POTASSIUM: Potassium:SCnc:Pt:Ser/Plas:Qn:: 5

## 2019-11-27 DIAGNOSIS — Z940 Kidney transplant status: Principal | ICD-10-CM

## 2019-11-28 DIAGNOSIS — Z940 Kidney transplant status: Principal | ICD-10-CM

## 2019-11-29 ENCOUNTER — Ambulatory Visit
Admit: 2019-11-29 | Discharge: 2019-11-29 | Payer: PRIVATE HEALTH INSURANCE | Attending: Nephrology | Primary: Nephrology

## 2019-11-29 ENCOUNTER — Ambulatory Visit: Admit: 2019-11-29 | Discharge: 2019-11-29 | Payer: PRIVATE HEALTH INSURANCE

## 2019-11-29 DIAGNOSIS — N189 Chronic kidney disease, unspecified: Principal | ICD-10-CM

## 2019-11-29 DIAGNOSIS — D631 Anemia in chronic kidney disease: Secondary | ICD-10-CM

## 2019-11-29 DIAGNOSIS — Z940 Kidney transplant status: Principal | ICD-10-CM

## 2019-11-29 DIAGNOSIS — R87612 Low grade squamous intraepithelial lesion on cytologic smear of cervix (LGSIL): Principal | ICD-10-CM

## 2019-11-29 DIAGNOSIS — Z79899 Other long term (current) drug therapy: Principal | ICD-10-CM

## 2019-11-29 LAB — CBC W/ AUTO DIFF
BASOPHILS ABSOLUTE COUNT: 0 10*9/L (ref 0.0–0.1)
BASOPHILS RELATIVE PERCENT: 1 %
EOSINOPHILS ABSOLUTE COUNT: 0.1 10*9/L (ref 0.0–0.7)
EOSINOPHILS RELATIVE PERCENT: 2.6 %
HEMATOCRIT: 36.2 % (ref 35.0–44.0)
HEMOGLOBIN: 11.9 g/dL — ABNORMAL LOW (ref 12.0–15.5)
LYMPHOCYTES RELATIVE PERCENT: 18.6 %
MEAN CORPUSCULAR HEMOGLOBIN CONC: 32.8 g/dL (ref 30.0–36.0)
MEAN CORPUSCULAR HEMOGLOBIN: 30 pg (ref 26.0–34.0)
MEAN CORPUSCULAR VOLUME: 91.6 fL (ref 82.0–98.0)
MEAN PLATELET VOLUME: 7.7 fL (ref 7.0–10.0)
MONOCYTES ABSOLUTE COUNT: 0.4 10*9/L (ref 0.1–1.0)
MONOCYTES RELATIVE PERCENT: 10.1 %
NEUTROPHILS RELATIVE PERCENT: 67.7 %
PLATELET COUNT: 247 10*9/L (ref 150–450)
RED BLOOD CELL COUNT: 3.95 10*12/L (ref 3.90–5.03)
RED CELL DISTRIBUTION WIDTH: 13.5 % (ref 12.0–15.0)
WBC ADJUSTED: 3.6 10*9/L (ref 3.5–10.5)

## 2019-11-29 LAB — URINALYSIS
BILIRUBIN UA: NEGATIVE
BLOOD UA: NEGATIVE
GLUCOSE UA: NEGATIVE
KETONES UA: NEGATIVE
LEUKOCYTE ESTERASE UA: NEGATIVE
NITRITE UA: NEGATIVE
PH UA: 6 (ref 5.0–9.0)
PROTEIN UA: 100 — AB
RBC UA: 4 /HPF — ABNORMAL HIGH (ref <4)
SQUAMOUS EPITHELIAL: 20 /HPF — ABNORMAL HIGH (ref 0–5)
UROBILINOGEN UA: 0.2
WBC UA: 3 /HPF (ref 0–5)

## 2019-11-29 LAB — PROTEIN / CREATININE RATIO, URINE
PROTEIN URINE: 149.2 mg/dL
PROTEIN/CREAT RATIO, URINE: 2

## 2019-11-29 LAB — HEMOGLOBIN A1C: Hemoglobin A1c/Hemoglobin.total:MFr:Pt:Bld:Qn:: <3.8 — ABNORMAL LOW

## 2019-11-29 LAB — GLUCOSE UA: Glucose:MCnc:Pt:Urine:Qn:Test strip: NEGATIVE

## 2019-11-29 LAB — LIPID PANEL
CHOLESTEROL/HDL RATIO SCREEN: 3.6 (ref 1.0–4.5)
CHOLESTEROL: 159 mg/dL (ref <=200)
HDL CHOLESTEROL: 44 mg/dL (ref 40–60)
NON-HDL CHOLESTEROL: 115 mg/dL (ref 70–130)
TRIGLYCERIDES: 80 mg/dL (ref 0–150)

## 2019-11-29 LAB — BASIC METABOLIC PANEL
ANION GAP: 8 mmol/L (ref 5–14)
BLOOD UREA NITROGEN: 25 mg/dL — ABNORMAL HIGH (ref 9–23)
CALCIUM: 10.9 mg/dL — ABNORMAL HIGH (ref 8.7–10.4)
CHLORIDE: 106 mmol/L (ref 98–107)
CO2: 23.5 mmol/L (ref 20.0–31.0)
CREATININE: 1 mg/dL
EGFR CKD-EPI AA FEMALE: 87 mL/min/{1.73_m2} (ref >=60)
EGFR CKD-EPI NON-AA FEMALE: 76 mL/min/{1.73_m2} (ref >=60)
POTASSIUM: 4.9 mmol/L — ABNORMAL HIGH (ref 3.4–4.5)
SODIUM: 137 mmol/L (ref 135–145)

## 2019-11-29 LAB — IRON PANEL
IRON: 81 ug/dL
TOTAL IRON BINDING CAPACITY: 280 ug/dL (ref 250–425)

## 2019-11-29 LAB — BLOOD UREA NITROGEN: Urea nitrogen:MCnc:Pt:Ser/Plas:Qn:: 25 — ABNORMAL HIGH

## 2019-11-29 LAB — C3 COMPLEMENT: Complement C3:MCnc:Pt:Ser/Plas:Qn:: 95

## 2019-11-29 LAB — IRON SATURATION: Iron saturation:MFr:Pt:Ser/Plas:Qn:: 29

## 2019-11-29 LAB — RED CELL DISTRIBUTION WIDTH: Lab: 13.5

## 2019-11-29 LAB — SMEAR REVIEW

## 2019-11-29 LAB — PHOSPHORUS: Phosphate:MCnc:Pt:Ser/Plas:Qn:: 4

## 2019-11-29 LAB — MAGNESIUM: Magnesium:MCnc:Pt:Ser/Plas:Qn:: 1.7

## 2019-11-29 LAB — TACROLIMUS, TROUGH: Lab: 6.7

## 2019-11-29 LAB — PROTEIN/CREAT RATIO, URINE: Protein/Creatinine:MRto:Pt:Urine:Qn:: 2

## 2019-11-29 LAB — FASTING

## 2019-11-29 NOTE — Unmapped (Addendum)
Plan today:  - COVID booster  - we will refer you to [facility] to repeat pap test (this is a screening test to prevent cervical cancer)  - we will continue to talk with Dr. Norma Fredrickson about your fistula. If you develop a scab or a change in the appearance of the bump on your fistula, please let your coordinator know.  - if your urine protein is high, we will call you to recommend scheduling a kidney biopsy

## 2019-11-29 NOTE — Unmapped (Signed)
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  Active medical issues include:    S/P deceased donor kidney transplant  Native kidney disease was C3GN. We are alarmed about her proteinuria to 2 g/g. DDx includes recurrence of C3GN vs rejection. Serum creatinine is at her baseline around 1.0 mg/dL.  She has no history of rejection or DSAs (last DSA screen 08/16/19).   - please schedule outpatient biopsy to evaluate proteinuria     Immunosuppression Management [High Risk Medical Decision Making For Drug Therapy Requiring Intensive Monitoring For Toxicity]:   Tacrolimus target 6-10 ng/mL.   - Envarsus 9 mg daily   - Myfortic 540 mg bid  - steroid free    Infectious Prophylaxis and Monitoring: CMV & EBV: D+/R+  PJP prophylaxis: completed (s/p Bactrim x 6 months)  CMV prophylaxis: completed (s/p Valcyte x3 months).   COVID-19 vaccination: completed including booster dose today    Seizures Disorder, no seizures since 2014:    Will continue Keppra.  She will follow up with [name], NP at [facility].  Last visit on 05/23/2018, with yearly follow ups.     Myoclonus:   Will continue Klonopin, prescribed by PCP.  Will follow with [facility], continue to use walker and cane. Last see on 10/19/18. F/U 1 year.    Metabolic Acidosis and Hyperkalemia  Continue sodium bicarbonate 650 mg bid     Pseudoaneurysm of AVF  Saw Dr. Norma Fredrickson, with plan to defer any intervention until 1 year has passed from transplant. She has no high output heart failure by echo nor by ROS. We will coordinate with Dr. Norma Fredrickson. Favor a nonurgent revision to minimize any risk of rupture, but spare the AVF for future use if possible to do so safely. Patient advised to contact us with any scabbing or erosion of the skin over the pseudoaneurysm.    Health Maintenance:   Pap Smear: 12/08/18 showed [facility]). 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 She is due for repeat. Referred to GYN today.  Discussed need for contraception and pregnancy prevention due to medications.   Immunizations:   Immunization History   Administered Date(s) Administered   ??? COVID-19 VACC,MRNA,(PFIZER)(PF)(IM) 07/06/2019, 07/31/2019, 11/29/2019   ??? Hepatitis B, Adult 01/21/2012, 02/16/2012, 03/17/2012, 07/14/2012   ??? HiB, unspecified 04/29/2011   ??? INFLUENZA TIV (TRI) 74MO+ W/ PRESERV (IM) 03/31/2011   ??? Influenza Vaccine Quad (IIV4 PF) 77mo+ injectable 12/15/2018, 11/29/2019   ??? Influenza Vaccine Quad (IIV4 W/PRESERV) 74MO+ 12/16/2017   ??? Meningococcal C Conjugate 04/29/2011   ??? PNEUMOCOCCAL POLYSACCHARIDE 23 02/04/2012   ??? PPD Test 05/01/2011   ??? Pneumococcal Conjugate 13-Valent 02/23/2019     Childbearing age  Discussed the following today 11/29/19: Most pre-menopausal women experience return of fertility post-transplant, and a safe pregnancy is possible with careful planning. Recommend using abstinence or a highly effective contraception to avoid pregnancy until planning pregnancy, and if planning pregnancy please notify nephrology so that we can adjust medications to avoid teratogens.    History of Present Illness    She is a 29 y.o. 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 Her post-operative course was complicated by hypotension and hypoxia thought to be caused by anaphylactic reaction to Campath. She has experienced no episodes of rejection, evidence of disease recurrence, or infectious complications.  Since her last visit:  - states she is doing well  - has some headaches about twice a week, do not interfere with activities, sometimes takes Tylenol.  - saw Dr Norma Fredrickson for pseudoaneurysm of AVF      Transplant History:  Date of Transplant: 03/10/19  Organ Received: Left DDKT, DBD, SCD, KDPI 2%; cold ischemia 18 hrs.  Native Kidney Disease: C3 glomerulonephropathy  Pre-transplant CPRA:  0%  Post-Transplant Course: Complicated by anaphylactic reaction to Campath in OR  Induction: Campath  Date of Ureteral Stent Removal: 04/17/19  CMV/EBV Status: CMV D+/R+, EBV D+/R+  Rejection Episodes: None  Donor Specific Antibodies: Negative (most recent 04/18/19)     Results of Renal Imaging (pre and post):     Pre-Txp 01/18/17  Right interpolar cyst 1.4 x 1.3 x 1.1 cm (previously 0.9 x 0.9 x 0.9 cm), second, smaller cyst in the interpolar region measures 1.1 x 1.1 x 1.1 cm, left interpolar region cystic lesion with tiny peripheral echogenic focus measuring 0.7 x 0.8 x 0.7 cm. Multiple other small subcentimeter hypodensities are seen, too small to characterize. No hydronephrosis or renal calculi.     CT Renal Mass Protocol 02/25/2018  Bilateral atrophic kidneys with multiple simple cysts. No enhancing soft tissue renal mass. No hydronephrosis or calculi.    Post-Txp 03/10/19 (txp kidney only)  Normal size and echogenicity.  No solid masses or calculi. No perinephric collections identified. No hydronephrosis    Other Past Medical History  1. C3 Glomerulopathy  2. Seizure Disorder  3. 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 HTN  5. 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    Medications  Current Outpatient Medications   Medication Sig Dispense Refill   ??? acetaminophen (TYLENOL) 500 MG tablet Take 1-2 tablets (500-1,000 mg total) by mouth every six (6) hours as needed for pain or fever (> 38C). 100 tablet 0   ??? aspirin (ECOTRIN) 81 MG tablet Take 1 tablet (81 mg total) by mouth daily. 30 tablet 11   ??? cholecalciferol, vitamin D3-125 mcg, 5,000 unit,, 125 mcg (5,000 unit) capsule Take 1 capsule (125 mcg total) by mouth daily. 100 capsule 11   ??? clonazePAM (KLONOPIN) 0.5 MG tablet Take 1 tablet (0.5 mg total) by mouth two (2) times a day. Needs appointment for further refills. 60 tablet 0   ??? levETIRAcetam (KEPPRA) 250 MG tablet TAKE 1 TABLET BY MOUTH TWICE DAILY. 180 tablet 3   ??? magnesium oxide-Mg AA chelate (MAGNESIUM, AMINO ACID CHELATE,) 133 mg Tab Take 2 tablets by mouth Two (2) times a day. 120 tablet 11   ??? mycophenolate (MYFORTIC) 180 MG EC tablet Take 3 tablets (540 mg total) by mouth Two (2) times a day. Adjust dose per medication card. 180 tablet 11   ??? sodium bicarbonate 650 mg tablet Take 1 tablet (650 mg total) by mouth Two (2) times a day. 60 tablet 11   ??? tacrolimus (ENVARSUS XR) 1 mg Tb24 extended release tablet Take ONE (1mg ) tablet with TWO (4mg ) tablets by mouth daily for a total daily dose of 9mg . 30 tablet 11   ??? tacrolimus (ENVARSUS XR) 4 mg Tb24 extended release tablet Take TWO (4mg ) tablets with ONE (1mg ) tablet by mouth daily for a total daily dose of 9mg . 90 tablet 11     No current facility-administered medications for this visit.  Physical Exam  BP 131/87 (BP Site: R Arm, BP Position: Sitting, BP Cuff Size: Medium)  - Pulse 94  - Temp 36.5 ??C (97.7 ??F) (Temporal)  - Wt 75.2 kg (165 lb 12.8 oz)  - BMI 30.33 kg/m??   General: no acute distress  HEENT: anicteric  Neck: neck supple, no adenopathy  CV: normal rate, normal rhythm, no murmur, no gallops, no rubs appreciated  Lungs: clear to auscultation bilaterally  Abdomen: soft, non tender, incision well healed.  Extremities:  no edema, pseudoaneurysm on AVF left upper arm.  Neurologic: Patient uses a walker for ambulation due to myoclonus.     Recent Results (from the past 170 hour(s))   Protein/Creatinine Ratio, Urine    Collection Time: 11/29/19  8:18 AM   Result Value Ref Range    Creat U 74.6 Undefined mg/dL    Protein, Ur 098.1 mg/dL    Protein/Creatinine Ratio, Urine 2.000 Undefined   Urinalysis    Collection Time: 11/29/19  8:18 AM   Result Value Ref Range    Color, UA Yellow     Clarity, UA Clear     Specific Gravity, UA 1.020 1.005 - 1.030    pH, UA 6.0 5.0 - 9.0    Leukocyte Esterase, UA Negative Negative    Nitrite, UA Negative Negative    Protein, UA 100 mg/dL (A) Negative    Glucose, UA Negative Negative    Ketones, UA Negative Negative    Urobilinogen, UA 0.2 mg/dL 0.2 - 2.0 mg/dL    Bilirubin, UA Negative Negative    Blood, UA Negative Negative    RBC, UA 4 (H) <4 /HPF    WBC, UA 3 0 - 5 /HPF    Squam Epithel, UA 20 (H) 0 - 5 /HPF    Bacteria, UA Occasional (A) None Seen /HPF   Iron Panel    Collection Time: 11/29/19  9:27 AM   Result Value Ref Range    Iron 81 50 - 170 ug/dL    TIBC 191 478 - 295 ug/dL    Iron Saturation (%) 29 %   C3 complement    Collection Time: 11/29/19  9:27 AM   Result Value Ref Range    C3 Complement 95 90 - 170 mg/dL   Hemoglobin A2Z    Collection Time: 11/29/19  9:27 AM   Result Value Ref Range    Hemoglobin A1C <3.8 (L) 4.8 - 5.6 %   Lipid panel    Collection Time: 11/29/19  9:27 AM   Result Value Ref Range    Triglycerides 80 0 - 150 mg/dL    Cholesterol 308 <=657 mg/dL    HDL 44 40 - 60 mg/dL    LDL Calculated 99 40 - 99 mg/dL    VLDL Cholesterol Cal 16 8 - 32 mg/dL    Chol/HDL Ratio 3.6 1.0 - 4.5    Non-HDL Cholesterol 115 70 - 130 mg/dL    FASTING Yes    Tacrolimus, Trough   [facility])    Collection Time: 11/29/19  9:27 AM   Result Value Ref Range    Tacrolimus, Trough 6.7 5.0 - 15.0 ng/mL   Basic Metabolic Panel    Collection Time: 11/29/19  9:27 AM   Result Value Ref Range    Sodium 137 135 - 145 mmol/L    Potassium 4.9 (H) 3.4 - 4.5 mmol/L    Chloride 106 98 - 107 mmol/L    CO2 23.5 20.0 - 31.0 mmol/L  Anion Gap 8 5 - 14 mmol/L    BUN 25 (H) 9 - 23 mg/dL    Creatinine 1.61 0.96 - 1.02 mg/dL    BUN/Creatinine Ratio 25     EGFR CKD-EPI Non-African American, Female 76 >=60 mL/min/1.70m2    EGFR CKD-EPI African American, Female 12 >=60 mL/min/1.81m2    Glucose 93 70 - 99 mg/dL    Calcium 04.5 (H) 8.7 - 10.4 mg/dL   Phosphorus Level    Collection Time: 11/29/19  9:27 AM   Result Value Ref Range    Phosphorus 4.0 2.4 - 5.1 mg/dL   Magnesium Level    Collection Time: 11/29/19  9:27 AM   Result Value Ref Range    Magnesium 1.7 1.6 - 2.6 mg/dL   CBC w/ Differential    Collection Time: 11/29/19  9:27 AM   Result Value Ref Range    WBC 3.6 3.5 - 10.5 10*9/L    RBC 3.95 3.90 - 5.03 10*12/L    HGB 11.9 (L) 12.0 - 15.5 g/dL    HCT 40.9 81.1 - 91.4 %    MCV 91.6 82.0 - 98.0 fL    MCH 30.0 26.0 - 34.0 pg    MCHC 32.8 30.0 - 36.0 g/dL    RDW 78.2 95.6 - 21.3 %    MPV 7.7 7.0 - 10.0 fL    Platelet 247 150 - 450 10*9/L    Neutrophils % 67.7 %    Lymphocytes % 18.6 %    Monocytes % 10.1 %    Eosinophils % 2.6 %    Basophils % 1.0 %    Absolute Neutrophils 2.4 1.7 - 7.7 10*9/L    Absolute Lymphocytes 0.7 0.7 - 4.0 10*9/L    Absolute Monocytes 0.4 0.1 - 1.0 10*9/L    Absolute Eosinophils 0.1 0.0 - 0.7 10*9/L    Absolute Basophils 0.0 0.0 - 0.1 10*9/L   Morphology Review    Collection Time: 11/29/19  9:27 AM   Result Value Ref Range    Smear Review Comments See Comment (A) Undefined

## 2019-11-29 NOTE — Unmapped (Signed)
Assessment    [facility]. Reviewed meds/symptoms. Any new medications? no                Pt reports no fever/cold/flu symptoms    BP: 131/87 today/ Home BP not checking   BG: wnl   Headache/Dizziness/Lightheaded: reports PM HA 3-4x/week. Tylenol  1x/week is helpful. Advised she check BP in the evenings to track.   Hand tremors: denies   Numbness/tingling: denies   Fevers/chills/sweats: denies   CP/SOB/palpatations: denies   Nausea/vomiting/heartburn: denies   Diarrhea/constipation: denies   UTI symptoms (burn/pain/itch/frequency/urgency/odor/color/foam): denies   No visible or palpable edema    Appetite good; reports adequate hydration. 4x 16oz/bottles/fluid per day.    Pt reports being well rested and getting adequate exercise despite Covid 19 quarantine. Taking care to mask, hand hygeine and minimal public activity. Offered support and guidance for this process given her immune suppressed state. Also discussed reduced covid vaccine coverage for transplant patients and importance of continuing to mask and practice safe distancing. Commented that a booster vaccine may be advised in the near future. *Pt advised she can get booster today in [facility] on [facility] floor vaccine clinic      Pain: denies   Incision: c/d/i   Drain: n/a   Foley: n/a    Last Envarsus taken 1100 yesterday; held for this morning's labs.    No other complaints or concerns.     Referrals needed: none    Immunization status: UTD - Covid vaxx x2. Booster today      Functional Score: 100     Employment/work status: student

## 2019-12-01 LAB — CMV QUANT LOG10: Lab: 0

## 2019-12-01 LAB — CMV DNA, QUANTITATIVE, PCR
CMV QUANT: <50 [IU]/mL — ABNORMAL HIGH (ref <0)
CMV VIRAL LD: DETECTED — AB

## 2019-12-06 LAB — CBC W/ DIFFERENTIAL
BANDED NEUTROPHILS ABSOLUTE COUNT: 0.2 10*3/uL — ABNORMAL HIGH (ref 0.0–0.1)
BASOPHILS ABSOLUTE COUNT: 0 10*3/uL (ref 0.0–0.2)
BASOPHILS RELATIVE PERCENT: 1 %
EOSINOPHILS ABSOLUTE COUNT: 0.1 10*3/uL (ref 0.0–0.4)
EOSINOPHILS RELATIVE PERCENT: 4 %
HEMATOCRIT: 36.2 % (ref 34.0–46.6)
HEMOGLOBIN: 11.5 g/dL (ref 11.1–15.9)
IMMATURE GRANULOCYTES: 5 %
LYMPHOCYTES ABSOLUTE COUNT: 0.8 10*3/uL (ref 0.7–3.1)
LYMPHOCYTES RELATIVE PERCENT: 22 %
MEAN CORPUSCULAR HEMOGLOBIN CONC: 31.8 g/dL (ref 31.5–35.7)
MEAN CORPUSCULAR HEMOGLOBIN: 29.6 pg (ref 26.6–33.0)
MEAN CORPUSCULAR VOLUME: 93 fL (ref 79–97)
NEUTROPHILS ABSOLUTE COUNT: 2.2 10*3/uL (ref 1.4–7.0)
NEUTROPHILS RELATIVE PERCENT: 59 %
PLATELET COUNT: 220 10*3/uL (ref 150–450)
RED BLOOD CELL COUNT: 3.88 x10E6/uL (ref 3.77–5.28)
RED CELL DISTRIBUTION WIDTH: 12.7 % (ref 11.7–15.4)

## 2019-12-06 LAB — RENAL FUNCTION PANEL
ALBUMIN: 4.4 g/dL (ref 3.9–5.0)
BUN / CREAT RATIO: 25 — ABNORMAL HIGH (ref 9–23)
CALCIUM: 9.9 mg/dL (ref 8.7–10.2)
CHLORIDE: 104 mmol/L (ref 96–106)
CO2: 21 mmol/L (ref 20–29)
CREATININE: 1.15 mg/dL — ABNORMAL HIGH (ref 0.57–1.00)
GFR MDRD AF AMER: 74 mL/min/{1.73_m2}
GLUCOSE: 93 mg/dL (ref 65–99)
PHOSPHORUS, SERUM: 4.5 mg/dL — ABNORMAL HIGH (ref 3.0–4.3)
POTASSIUM: 4.8 mmol/L (ref 3.5–5.2)
SODIUM: 137 mmol/L (ref 134–144)

## 2019-12-06 LAB — PLATELET COUNT: Platelets:NCnc:Pt:Bld:Qn:Automated count: 220

## 2019-12-06 LAB — CO2: Carbon dioxide:SCnc:Pt:Ser/Plas:Qn:: 21

## 2019-12-06 LAB — MAGNESIUM: Magnesium:MCnc:Pt:Ser/Plas:Qn:: 1.7

## 2019-12-07 LAB — HLA DS POST TRANSPLANT
ANTI-DONOR DRW #1 MFI: 182 MFI
ANTI-DONOR HLA-A #1 MFI: 42 MFI
ANTI-DONOR HLA-B #1 MFI: 188 MFI
ANTI-DONOR HLA-B #2 MFI: 92 MFI
ANTI-DONOR HLA-C #1 MFI: 230 MFI
ANTI-DONOR HLA-C #2 MFI: 546 MFI
ANTI-DONOR HLA-DP AG #1 MFI: 372 MFI
ANTI-DONOR HLA-DQB #1 MFI: 241 MFI
ANTI-DONOR HLA-DR #1 MFI: 176 MFI
ANTI-DONOR HLA-DR #2 MFI: 413 MFI

## 2019-12-07 LAB — FSAB CLASS 1 ANTIBODY SPECIFICITY: HLA CLASS 1 ANTIBODY RESULT: POSITIVE

## 2019-12-07 LAB — DONOR HLA-DR ANTIGEN #2

## 2019-12-07 LAB — HLA CL1 ANTIBODY COMM: Lab: 0

## 2019-12-07 LAB — TACROLIMUS BLOOD: Tacrolimus:MCnc:Pt:Bld:Qn:LC/MS/MS: 9.2

## 2019-12-07 LAB — HLA CL2 AB RESULT: Lab: POSITIVE

## 2019-12-07 LAB — FSAB CLASS 2 ANTIBODY SPECIFICITY

## 2019-12-07 NOTE — Unmapped (Signed)
Received voicemail from patient asking for call back as she had a nutrition question.  Called patient back and she did not pick up phone. Left voicemail stating that Thursday was very busy, but Friday morning should be a good time to talk if needed.   Also stated that if patient would like an appointment with this writer that could be set up as well. Requested call back from patient when she is able.     Lanelle Bal, RD, LDN  Abdominal Transplant Dietitian   Pager: [phone]

## 2019-12-08 LAB — CMV DNA, QUANTITATIVE, PCR: CMV QUANT: NEGATIVE [IU]/mL

## 2019-12-08 LAB — LOG10 CMV QN DNA PL

## 2019-12-11 DIAGNOSIS — Z940 Kidney transplant status: Principal | ICD-10-CM

## 2019-12-12 ENCOUNTER — Other Ambulatory Visit: Payer: Self-pay | Admitting: Registered Nurse

## 2019-12-12 DIAGNOSIS — G253 Myoclonus: Secondary | ICD-10-CM

## 2019-12-12 DIAGNOSIS — Z940 Kidney transplant status: Principal | ICD-10-CM

## 2019-12-12 DIAGNOSIS — D849 Immunodeficiency, unspecified: Principal | ICD-10-CM

## 2019-12-12 DIAGNOSIS — R7989 Other specified abnormal findings of blood chemistry: Principal | ICD-10-CM

## 2019-12-12 DIAGNOSIS — Z79899 Other long term (current) drug therapy: Principal | ICD-10-CM

## 2019-12-12 MED ORDER — CLONAZEPAM 0.5 MG TABLET
ORAL_TABLET | Freq: Two times a day (BID) | ORAL | 0 refills | 30.00000 days
Start: 2019-12-12 — End: ?

## 2019-12-12 NOTE — Telephone Encounter (Signed)
Klonopin e-scribed today.  Placed a call to Heather Sims regarding the above, she verbalizes understanding. She also states she's trying to see if one of her other doctors will prescribe the Klonopin, she will call office with an update, She verbalizes understanding.

## 2019-12-12 NOTE — Unmapped (Signed)
Per Dr Margaretmary Bayley patient needs out patient biopsy for UPC.    Called and updated patient. Went over biopsy instructions (hold ASA, NPO after midnight, bring medications for after labs, needs a driver)    Messaged team for scheduling

## 2019-12-12 NOTE — Unmapped (Signed)
[facility] Refill Coordination Note    Specialty Medication(s) to be Shipped:   Transplant: Envarsus 1mg , Envarsus 4mg  and mycophenolate mofetil 180mg     Other medication(s) to be shipped: Levetiracetam 250mg  and Sodium Bicarbonate 650mg      Carolyn Kelley, DOB: [birth year]  Phone: [phone] (home)       All above HIPAA information was verified with patient.     Was a Nurse, learning disability used for this call? No    Completed refill call assessment today to schedule patient's medication shipment from the [facility] [phone]).       Specialty medication(s) and dose(s) confirmed: Regimen is correct and unchanged.   Changes to medications: Carolyn Kelley reports no changes at this time.  Changes to insurance: No  Questions for the pharmacist: No    Confirmed patient received Welcome Packet with first shipment. The patient will receive a drug information handout for each medication shipped and additional FDA Medication Guides as required.       DISEASE/MEDICATION-SPECIFIC INFORMATION        N/A    SPECIALTY MEDICATION ADHERENCE     Medication Adherence    Patient reported X missed doses in the last month: 0  Specialty Medication: Envarsus XR 1mg   Patient is on additional specialty medications: Yes  Additional Specialty Medications: Envarsus XR 4mg   Patient Reported Additional Medication X Missed Doses in the Last Month: 0  Patient is on more than two specialty medications: Yes  Specialty Medication: Mycophenolate 180  Patient Reported Additional Medication X Missed Doses in the Last Month: 0  Informant: patient                Envarsus XR 1 mg: 10 days of medicine on hand   Envarsus XR 4 mg: 10 days of medicine on hand   Mycophenolate 180 mg: 10 days of medicine on hand         SHIPPING     Shipping address confirmed in Epic.     Delivery Scheduled: Yes, Expected medication delivery date: 12/19/19.     Medication will be delivered via UPS to the prescription address in [employer] [place].    Carolyn Kelley   [facility] Specialty Technician

## 2019-12-13 LAB — RENAL FUNCTION PANEL
ALBUMIN: 4.5 g/dL (ref 3.9–5.0)
BLOOD UREA NITROGEN: 30 mg/dL — ABNORMAL HIGH (ref 6–20)
BUN / CREAT RATIO: 29 — ABNORMAL HIGH (ref 9–23)
CALCIUM: 10.1 mg/dL (ref 8.7–10.2)
CHLORIDE: 106 mmol/L (ref 96–106)
CO2: 20 mmol/L (ref 20–29)
GFR MDRD AF AMER: 85 mL/min/{1.73_m2}
GFR MDRD NON AF AMER: 74 mL/min/{1.73_m2}
GLUCOSE: 93 mg/dL (ref 65–99)
POTASSIUM: 5.2 mmol/L (ref 3.5–5.2)
SODIUM: 136 mmol/L (ref 134–144)

## 2019-12-13 LAB — CBC W/ DIFFERENTIAL
BANDED NEUTROPHILS ABSOLUTE COUNT: 0.1 10*3/uL (ref 0.0–0.1)
BASOPHILS RELATIVE PERCENT: 1 %
EOSINOPHILS ABSOLUTE COUNT: 0.1 10*3/uL (ref 0.0–0.4)
EOSINOPHILS RELATIVE PERCENT: 3 %
HEMATOCRIT: 33.6 % — ABNORMAL LOW (ref 34.0–46.6)
HEMOGLOBIN: 11 g/dL — ABNORMAL LOW (ref 11.1–15.9)
IMMATURE GRANULOCYTES: 3 %
LYMPHOCYTES ABSOLUTE COUNT: 0.7 10*3/uL (ref 0.7–3.1)
LYMPHOCYTES RELATIVE PERCENT: 19 %
MEAN CORPUSCULAR HEMOGLOBIN CONC: 32.7 g/dL (ref 31.5–35.7)
MEAN CORPUSCULAR HEMOGLOBIN: 29.5 pg (ref 26.6–33.0)
MEAN CORPUSCULAR VOLUME: 90 fL (ref 79–97)
MONOCYTES RELATIVE PERCENT: 13 %
NEUTROPHILS ABSOLUTE COUNT: 2.3 10*3/uL (ref 1.4–7.0)
NEUTROPHILS RELATIVE PERCENT: 61 %
PLATELET COUNT: 227 10*3/uL (ref 150–450)
RED BLOOD CELL COUNT: 3.73 x10E6/uL — ABNORMAL LOW (ref 3.77–5.28)
RED CELL DISTRIBUTION WIDTH: 12.8 % (ref 11.7–15.4)
WHITE BLOOD CELL COUNT: 3.7 10*3/uL (ref 3.4–10.8)

## 2019-12-13 LAB — MONOCYTES RELATIVE PERCENT: Monocytes/100 leukocytes:NFr:Pt:Bld:Qn:Automated count: 13

## 2019-12-13 LAB — MAGNESIUM
MAGNESIUM: 1.7 mg/dL (ref 1.6–2.3)
Magnesium:MCnc:Pt:Ser/Plas:Qn:: 1.7

## 2019-12-13 LAB — GFR MDRD NON AF AMER
Glomerular filtration rate/1.73 sq M.predicted.non black:ArVRat:Pt:Ser/Plas/Bld:Qn:Creatinine-based formula (CKD-EPI): 74

## 2019-12-14 LAB — TACROLIMUS BLOOD: Tacrolimus:MCnc:Pt:Bld:Qn:LC/MS/MS: 10.7

## 2019-12-14 MED ORDER — CLONAZEPAM 0.5 MG TABLET: tablet | 3 refills | 0 days

## 2019-12-15 LAB — LOG10 CMV QN DNA PL

## 2019-12-15 MED FILL — CLONAZEPAM 0.5 MG TABLET: 30 days supply | Qty: 60 | Fill #0 | Status: AC

## 2019-12-15 MED FILL — CLONAZEPAM 0.5 MG TABLET: 30 days supply | Qty: 60 | Fill #0

## 2019-12-18 DIAGNOSIS — Z940 Kidney transplant status: Principal | ICD-10-CM

## 2019-12-18 MED FILL — SODIUM BICARBONATE 650 MG TABLET: 30 days supply | Qty: 60 | Fill #2 | Status: AC

## 2019-12-18 MED FILL — SODIUM BICARBONATE 650 MG TABLET: ORAL | 30 days supply | Qty: 60 | Fill #2

## 2019-12-18 MED FILL — LEVETIRACETAM 250 MG TABLET: ORAL | 90 days supply | Qty: 180 | Fill #2

## 2019-12-18 MED FILL — ENVARSUS XR 1 MG TABLET,EXTENDED RELEASE: 30 days supply | Qty: 30 | Fill #3 | Status: AC

## 2019-12-18 MED FILL — ENVARSUS XR 1 MG TABLET,EXTENDED RELEASE: 30 days supply | Qty: 30 | Fill #3

## 2019-12-18 MED FILL — LEVETIRACETAM 250 MG TABLET: 90 days supply | Qty: 180 | Fill #2 | Status: AC

## 2019-12-18 MED FILL — ENVARSUS XR 4 MG TABLET,EXTENDED RELEASE: 30 days supply | Qty: 60 | Fill #3

## 2019-12-18 MED FILL — MYCOPHENOLATE SODIUM 180 MG TABLET,DELAYED RELEASE: 30 days supply | Qty: 180 | Fill #9 | Status: AC

## 2019-12-18 MED FILL — ENVARSUS XR 4 MG TABLET,EXTENDED RELEASE: 30 days supply | Qty: 60 | Fill #3 | Status: AC

## 2019-12-18 MED FILL — MYCOPHENOLATE SODIUM 180 MG TABLET,DELAYED RELEASE: ORAL | 30 days supply | Qty: 180 | Fill #9

## 2019-12-18 NOTE — Unmapped (Signed)
Kidney Transplant Biopsy Note    Date of Biopsy Referral: 12/18/19    Referring Transplant Provider: Jackey Loge, MD  [facility]: [phone]  Pager: [phone]  Kidney Transplant Coordinator: Carolyn Kelley      Biopsy Fellow  _____________________ at _______________________ otherwise contact referring provider.     (NAME)   (CONTACT - pager/cell phone #)    ---------------------------------------------------------------------------------------------------------------------  Carolyn Kelley?: YES  Patient Name: [name]  MR: [id number]  Age: 29 y.o.  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 Yes  Previous transplant biopsies: No  If yes, what were the previous diagnoses?   Previous kidney transplants: No If yes, this is #:     Donor Information (if available)  Age of donor: 70  Gender: Female  Race: Hispanic  Type of donor: Cadaveric  Cold Ischemia time: 18 hrs  Delayed graft function: No  If yes, how many days on dialysis:     History/Clinical Diagnosis/Indication for Biopsy: Elevated UPC/Proteinuria.  Hx: C3GN, seizure disorder on Keppra; Myoclonus on Klonopin;   Transplant course complicated by hypotension and hypoxia thought to e caused by anaphylactic reaction to campath.    ----------------------------------------------------------------------------------------------------------------------  Current Baseline Immunosuppression: tacrolimus (Prograf or Envarsus) and mycophenolate (Cellcept or Myfortic) (please select ALL drugs used)  Specific anti-rejection treatment WITHIN 2 WEEKS before biopsy: No  If yes, what was the type of treatment? None  Patient off immunosuppression?: No  Patient seems adherent to immunosuppression? Yes  Patient is currently back on dialysis? No  Has patient had any prior episodes of rejection? No   If yes, what was the treatment? None  ----------------------------------------------------------------------------------------------------------------------  Donor Specific Antibody Results:  Lab Results   Component Value Date    Donor ID [id number] 11/29/2019    Donor HLA-A Antigen #1 A24 11/29/2019    Anti-Donor HLA-A #1 MFI 42 11/29/2019    Donor HLA-A Antigen #2 A24 08/16/2019    Donor HLA-B Antigen #1 B35 11/29/2019    Anti-Donor HLA-B #1 MFI 188 11/29/2019    Donor HLA-B Antigen #2 B39 11/29/2019    Anti-Donor HLA-B #2 MFI 92 11/29/2019    Donor HLA-C Antigen #1 C4 11/29/2019    Anti-Donor HLA-C #1 MFI 230 11/29/2019    Donor HLA-C Antigen #2 C7 11/29/2019    Anti-Donor HLA-C #2 MFI 546 11/29/2019    Donor HLA-DR Antigen #1 DR11 11/29/2019    Anti-Donor HLA-DR #1 MFI 176 11/29/2019    Donor HLA-DR Antigen #2 DR14 11/29/2019    Anti-Donor HLA-DR #2 MFI 413 11/29/2019    Donor DRw Antigen #1 DR52 11/29/2019    Anti-Donor DRw #1 MFI 182 11/29/2019    Donor HLA-DQB Antigen #1 DQ7 11/29/2019    Anti-Donor HLA-DQB #1 MFI 241 11/29/2019    Donor HLA-DQB Antigen #2 DQ7 08/16/2019    Anti-Donor HLA-DQB #2 MFI 641 08/16/2019    DSA Comment  11/29/2019      Comment:      The HLA antigens listed are donor antigens and the corresponding MFI value.  MFI values >= 1000 are considered positive.  A negative result does  not exclude the presence of low level DSA.  Blank donor antigen and MFI fields indicate unavailable donor HLA   type or lack of appropriate single antigen bead to determine DSA.  As such, the presence or absence of DSA to the locus cannot be confirmed.     Donor specific antibody (DSA) testing is performed with a  solid phase multiplex single antigen bead array method.  All procedures and reagents have been validated and performance characteristics determined by the [facility].    Certain of these tests have not been cleared/approved by the U.S. Food and Drug Administration (FDA).  The FDA has determined that such approval/clearance is not necessary because this laboratory is certified under the Clinical Laboratory Improvements   Amendments to perform high complexity testing.  This test is used for clinical purposes.  It should not be regarded as investigational or for research.  All HLA typings are performed using molecular methodologies.  HLA-A, B, C, DR, and DQ results are   serological equivalents of the molecular type.           Blood Pressure (mmHg):   BP Readings from Last 3 Encounters:   11/29/19 131/87   09/21/19 109/83   08/16/19 108/76     On Anti-hypertensive Therapy: No    Urinalysis:  Lab Results   Component Value Date    Color, UA Yellow 11/29/2019    Color, UA Yellow 07/03/2019    Specific Gravity, UA 1.020 11/29/2019    Specific Gravity, UA 1.014 07/03/2019    pH, UA 6.0 11/29/2019    pH, UA 6.5 07/03/2019    Glucose, UA Negative 11/29/2019    Glucose, UA Negative 07/03/2019    Ketones, UA Negative 11/29/2019    Ketones, UA Negative 07/03/2019    Blood, UA Negative 11/29/2019    Blood, UA Negative 07/03/2019    Nitrite, UA Negative 11/29/2019    Nitrite, UA Negative 07/03/2019    Leukocyte Esterase, UA Negative 11/29/2019    Leukocyte Esterase, UA Negative 07/03/2019    Urobilinogen, UA 0.2 mg/dL 16/12/9602    Urobilinogen, UA 0.2 07/03/2019    Bilirubin, UA Negative 11/29/2019    Bilirubin, UA Negative 07/03/2019    WBC, UA None seen 07/03/2019    RBC, UA 0-2 07/03/2019     Urine protein/creatinine ratio:   Lab Results   Component Value Date    Protein/Creatinine Ratio, Urine 2.000 11/29/2019    Protein/Creatinine Ratio, Urine 7.280 04/29/2011 Protein/Creat Ratio 145 11/21/2019        Creatinine (present peak): 1.15  Creatinine (baseline): 0.9-1.1  Lab Results   Component Value Date    CREATININE 1.02 (H) 12/12/2019    CREATININE 1.15 (H) 12/05/2019    CREATININE 1.00 11/29/2019    CREATININE 1.01 (H) 11/21/2019    CREATININE 0.98 11/14/2019       Glucose:   Lab Results   Component Value Date    Glucose 93 11/29/2019     HGBA1C:   Lab Results   Component Value Date    Hemoglobin A1C <3.8 (L) 11/29/2019    Hemoglobin A1C 5.3 04/29/2011     ----------------------------------------------------------------------------------------------------------------------  Clinical signs of infections at time of current biopsy:  BK: No   BK blood viral load:   Lab Results   Component Value Date    BK Blood Result Not Detected 08/16/2019        Urine decoy cells present: No    CMV: No   CMV viral load:  Lab Results   Component Value Date    CMV Viral Ld Detected (A) 11/29/2019    CMV Quant Negative 12/12/2019    CMV Quant Log10  11/29/2019      Comment:      <1.70 log          EBV:No   EBV viral load: No results found for: EBVIU, EBVLOG, EBVBD, EBVBR, EBBLD    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

## 2019-12-20 LAB — RENAL FUNCTION PANEL
ALBUMIN: 4.4 g/dL (ref 3.9–5.0)
BLOOD UREA NITROGEN: 20 mg/dL (ref 6–20)
BUN / CREAT RATIO: 21 (ref 9–23)
CALCIUM: 10 mg/dL (ref 8.7–10.2)
CHLORIDE: 102 mmol/L (ref 96–106)
CO2: 22 mmol/L (ref 20–29)
CREATININE: 0.95 mg/dL (ref 0.57–1.00)
GFR MDRD AF AMER: 93 mL/min/{1.73_m2}
GFR MDRD NON AF AMER: 81 mL/min/{1.73_m2}
GLUCOSE: 95 mg/dL (ref 65–99)
PHOSPHORUS, SERUM: 3.7 mg/dL (ref 3.0–4.3)
POTASSIUM: 4.8 mmol/L (ref 3.5–5.2)
SODIUM: 137 mmol/L (ref 134–144)

## 2019-12-20 LAB — CBC W/ DIFFERENTIAL
BANDED NEUTROPHILS ABSOLUTE COUNT: 0.1 10*3/uL (ref 0.0–0.1)
BASOPHILS ABSOLUTE COUNT: 0 10*3/uL (ref 0.0–0.2)
BASOPHILS RELATIVE PERCENT: 1 %
EOSINOPHILS ABSOLUTE COUNT: 0.1 10*3/uL (ref 0.0–0.4)
EOSINOPHILS RELATIVE PERCENT: 2 %
HEMATOCRIT: 34.3 % (ref 34.0–46.6)
HEMOGLOBIN: 11.1 g/dL (ref 11.1–15.9)
IMMATURE GRANULOCYTES: 2 %
LYMPHOCYTES ABSOLUTE COUNT: 0.8 10*3/uL (ref 0.7–3.1)
LYMPHOCYTES RELATIVE PERCENT: 18 %
MEAN CORPUSCULAR HEMOGLOBIN CONC: 32.4 g/dL (ref 31.5–35.7)
MEAN CORPUSCULAR HEMOGLOBIN: 29.8 pg (ref 26.6–33.0)
MEAN CORPUSCULAR VOLUME: 92 fL (ref 79–97)
MONOCYTES ABSOLUTE COUNT: 0.4 10*3/uL (ref 0.1–0.9)
MONOCYTES RELATIVE PERCENT: 10 %
NEUTROPHILS ABSOLUTE COUNT: 2.9 10*3/uL (ref 1.4–7.0)
NEUTROPHILS RELATIVE PERCENT: 67 %
PLATELET COUNT: 245 10*3/uL (ref 150–450)
RED BLOOD CELL COUNT: 3.72 x10E6/uL — ABNORMAL LOW (ref 3.77–5.28)
RED CELL DISTRIBUTION WIDTH: 13.1 % (ref 11.7–15.4)
WHITE BLOOD CELL COUNT: 4.3 10*3/uL (ref 3.4–10.8)

## 2019-12-20 LAB — PROTEIN / CREATININE RATIO, URINE
CREATININE URINE: 112.9 mg/dL
PROTEIN URINE: 14 mg/dL
PROTEIN/CREAT RATIO: 124 mg/g{creat} (ref 0–200)

## 2019-12-20 LAB — MAGNESIUM: MAGNESIUM: 1.7 mg/dL (ref 1.6–2.3)

## 2019-12-20 MED ORDER — CLONAZEPAM 0.5 MG TABLET
ORAL_TABLET | Freq: Two times a day (BID) | ORAL | 0 refills | 30 days
Start: 2019-12-20 — End: ?

## 2019-12-20 NOTE — Unmapped (Signed)
Biopsy needs to be rescheduled.    Patient able to come on 10.8.    Patient reports she had her clonazepam get lost in the mail from [facility] and needs a 1 month supply sent to local pharmacy since she is out.      Update: patient spoke to [facility] and they are [employer] same day shipping clonazepam to her today. She will call if there are any issues.    Confirmed October 8th biopsy date and updated team    Denies any other needs

## 2019-12-21 LAB — TACROLIMUS BLOOD: Tacrolimus:MCnc:Pt:Bld:Qn:LC/MS/MS: 7.8

## 2019-12-21 NOTE — Unmapped (Signed)
Per [facility] pharmacy unable to send clonazepam today. Their management looking into lost prescription.  Called in one month supply to local [facility]    Called and updated patient

## 2019-12-23 ENCOUNTER — Ambulatory Visit
Admit: 2019-12-23 | Discharge: 2019-12-23 | Disposition: A | Discharge status: Home / Self Care | Payer: PRIVATE HEALTH INSURANCE

## 2019-12-25 DIAGNOSIS — Z940 Kidney transplant status: Principal | ICD-10-CM

## 2019-12-27 LAB — RENAL FUNCTION PANEL
ALBUMIN: 4.4 g/dL (ref 3.9–5.0)
BLOOD UREA NITROGEN: 30 mg/dL — ABNORMAL HIGH (ref 6–20)
BUN / CREAT RATIO: 28 — ABNORMAL HIGH (ref 9–23)
CALCIUM: 9.9 mg/dL (ref 8.7–10.2)
CHLORIDE: 104 mmol/L (ref 96–106)
CO2: 17 mmol/L — ABNORMAL LOW (ref 20–29)
CREATININE: 1.08 mg/dL — ABNORMAL HIGH (ref 0.57–1.00)
GFR MDRD AF AMER: 80 mL/min/{1.73_m2}
GFR MDRD NON AF AMER: 69 mL/min/{1.73_m2}
PHOSPHORUS, SERUM: 3.9 mg/dL (ref 3.0–4.3)
POTASSIUM: 5.2 mmol/L (ref 3.5–5.2)

## 2019-12-27 LAB — CBC W/ DIFFERENTIAL
BANDED NEUTROPHILS ABSOLUTE COUNT: 0.2 10*3/uL — ABNORMAL HIGH (ref 0.0–0.1)
BASOPHILS ABSOLUTE COUNT: 0 10*3/uL (ref 0.0–0.2)
BASOPHILS RELATIVE PERCENT: 1 %
EOSINOPHILS ABSOLUTE COUNT: 0.1 10*3/uL (ref 0.0–0.4)
EOSINOPHILS RELATIVE PERCENT: 3 %
HEMATOCRIT: 34.3 % (ref 34.0–46.6)
HEMOGLOBIN: 10.8 g/dL — ABNORMAL LOW (ref 11.1–15.9)
LYMPHOCYTES ABSOLUTE COUNT: 0.8 10*3/uL (ref 0.7–3.1)
MEAN CORPUSCULAR HEMOGLOBIN CONC: 31.5 g/dL (ref 31.5–35.7)
MEAN CORPUSCULAR HEMOGLOBIN: 29.1 pg (ref 26.6–33.0)
MEAN CORPUSCULAR VOLUME: 93 fL (ref 79–97)
MONOCYTES ABSOLUTE COUNT: 0.5 10*3/uL (ref 0.1–0.9)
MONOCYTES RELATIVE PERCENT: 10 %
NEUTROPHILS ABSOLUTE COUNT: 3.4 10*3/uL (ref 1.4–7.0)
NEUTROPHILS RELATIVE PERCENT: 65 %
PLATELET COUNT: 223 10*3/uL (ref 150–450)
RED BLOOD CELL COUNT: 3.71 x10E6/uL — ABNORMAL LOW (ref 3.77–5.28)
RED CELL DISTRIBUTION WIDTH: 13 % (ref 11.7–15.4)
WHITE BLOOD CELL COUNT: 5.1 10*3/uL (ref 3.4–10.8)

## 2019-12-27 LAB — HEMATOCRIT: Hematocrit:VFr:Pt:Bld:Qn:Automated count: 34.3

## 2019-12-27 LAB — CREATININE: Creatinine:MCnc:Pt:Ser/Plas:Qn:: 1.08 — ABNORMAL HIGH

## 2019-12-27 LAB — TACROLIMUS BLOOD: Tacrolimus:MCnc:Pt:Bld:Qn:LC/MS/MS: 6.7

## 2019-12-27 LAB — MAGNESIUM: Magnesium:MCnc:Pt:Ser/Plas:Qn:: 1.8

## 2019-12-28 NOTE — Unmapped (Signed)
Called to verify pts renal biopsy, pt asked to arrive at 0730, pt taking Aspirin last dose 10/5, NPO status reinforced, pt will have responsible party accompany to appointment, COVID screening negative, all questions answered.

## 2019-12-29 ENCOUNTER — Ambulatory Visit: Admit: 2019-12-29 | Discharge: 2019-12-30 | Payer: PRIVATE HEALTH INSURANCE

## 2019-12-29 LAB — CBC W/ AUTO DIFF
BASOPHILS ABSOLUTE COUNT: 0 10*9/L (ref 0.0–0.1)
BASOPHILS RELATIVE PERCENT: 0.7 %
EOSINOPHILS ABSOLUTE COUNT: 0.1 10*9/L (ref 0.0–0.4)
EOSINOPHILS RELATIVE PERCENT: 2.9 %
HEMATOCRIT: 33.9 % — ABNORMAL LOW (ref 36.0–46.0)
HEMOGLOBIN: 11.4 g/dL — ABNORMAL LOW (ref 12.0–16.0)
LARGE UNSTAINED CELLS: 2 % (ref 0–4)
LYMPHOCYTES ABSOLUTE COUNT: 0.9 10*9/L — ABNORMAL LOW (ref 1.5–5.0)
LYMPHOCYTES RELATIVE PERCENT: 19.1 %
MEAN CORPUSCULAR HEMOGLOBIN: 31.3 pg (ref 26.0–34.0)
MEAN CORPUSCULAR VOLUME: 93.4 fL (ref 80.0–100.0)
MEAN PLATELET VOLUME: 8.2 fL (ref 7.0–10.0)
MONOCYTES ABSOLUTE COUNT: 0.4 10*9/L (ref 0.2–0.8)
MONOCYTES RELATIVE PERCENT: 7.2 %
NEUTROPHILS ABSOLUTE COUNT: 3.3 10*9/L (ref 2.0–7.5)
NEUTROPHILS RELATIVE PERCENT: 67.8 %
PLATELET COUNT: 261 10*9/L (ref 150–440)
RED BLOOD CELL COUNT: 3.63 10*12/L — ABNORMAL LOW (ref 4.00–5.20)
RED CELL DISTRIBUTION WIDTH: 14 % (ref 12.0–15.0)

## 2019-12-29 LAB — BASIC METABOLIC PANEL
ANION GAP: 6 mmol/L (ref 5–14)
BLOOD UREA NITROGEN: 26 mg/dL — ABNORMAL HIGH (ref 9–23)
BUN / CREAT RATIO: 23
CHLORIDE: 108 mmol/L — ABNORMAL HIGH (ref 98–107)
CO2: 21 mmol/L (ref 20.0–31.0)
CREATININE: 1.13 mg/dL — ABNORMAL HIGH
EGFR CKD-EPI AA FEMALE: 75 mL/min/{1.73_m2} (ref >=60)
EGFR CKD-EPI NON-AA FEMALE: 65 mL/min/{1.73_m2} (ref >=60)
GLUCOSE RANDOM: 101 mg/dL — ABNORMAL HIGH (ref 70–99)
POTASSIUM: 4.7 mmol/L — ABNORMAL HIGH (ref 3.4–4.5)
SODIUM: 135 mmol/L (ref 135–145)

## 2019-12-29 LAB — CMV DNA, QUANTITATIVE, PCR: CMV QUANT: NEGATIVE [IU]/mL

## 2019-12-29 LAB — CBC
HEMATOCRIT: 31.6 % — ABNORMAL LOW (ref 36.0–46.0)
HEMOGLOBIN: 10.6 g/dL — ABNORMAL LOW (ref 12.0–16.0)
MEAN CORPUSCULAR HEMOGLOBIN CONC: 33.7 g/dL (ref 31.0–37.0)
MEAN CORPUSCULAR HEMOGLOBIN: 31.4 pg (ref 26.0–34.0)
MEAN CORPUSCULAR VOLUME: 93.3 fL (ref 80.0–100.0)
MEAN PLATELET VOLUME: 8.5 fL (ref 7.0–10.0)
RED BLOOD CELL COUNT: 3.38 10*12/L — ABNORMAL LOW (ref 4.00–5.20)
WBC ADJUSTED: 5.1 10*9/L (ref 4.5–11.0)

## 2019-12-29 LAB — PROTIME: Coagulation tissue factor induced:Time:Pt:PPP:Qn:Coag: 13.1

## 2019-12-29 LAB — APTT
APTT: 32.5 s (ref 24.9–36.9)
Coagulation surface induced:Time:Pt:PPP:Qn:Coag: 32.5

## 2019-12-29 LAB — MEAN CORPUSCULAR VOLUME: Erythrocyte mean corpuscular volume:EntVol:Pt:RBC:Qn:Automated count: 93.3

## 2019-12-29 LAB — RED BLOOD CELL COUNT: Lab: 3.63 — ABNORMAL LOW

## 2019-12-29 LAB — EGFR CKD-EPI NON-AA FEMALE
Glomerular filtration rate/1.73 sq M.predicted.non black:ArVRat:Pt:Ser/Plas/Bld:Qn:Creatinine-based formula (CKD-EPI): 65

## 2019-12-29 LAB — SMEAR REVIEW

## 2019-12-29 LAB — SLIDE REVIEW

## 2019-12-29 LAB — CMV QUANT: Cytomegalovirus DNA:ACnc:Pt:Plas:Qn:Probe.amp.tar: NEGATIVE

## 2019-12-29 MED ADMIN — fentaNYL (PF) (SUBLIMAZE) injection: INTRAVENOUS | @ 14:00:00 | Stop: 2019-12-29

## 2019-12-29 MED ADMIN — midazolam (VERSED) injection: INTRAVENOUS | @ 14:00:00 | Stop: 2019-12-29

## 2019-12-29 NOTE — Unmapped (Signed)
INDICATIONS:  proteinuria in Kidney transplant    CONSENT/TIME OUT:    Risks, benefits and alternatives including blood loss requiring transfusion, loss of kidney or kidney function, and death were discussed with patient.  Written informed consent was obtained prior to the procedure and is detailed in the medical record.  Prior to the start of the procedure, a time out was taken and the identity of the patient was confirmed via name, medical record number and date of birth.  The availability of the correct equipment was verified.    PROCEDURE:  Name: Transplant Kidney Biopsy  Description: Ultrasound-guided transplant kidney biopsy    Pre-Procedure blood specimens were sent for CBC, PT/aPTT, and type & screen.     The patient was given 1 mg of midazolam and 25 mcg of fentanyl during the procedure, and 10 mL lidocaine 1% were used for local anesthesia. Under ultrasound guidance, a 16cm 16G biopsy needle was inserted into the right-sided transplanted kidney for a total of 1 passes with 1 core specimens obtained for analysis.      COMPLICATIONS:   Complications: bleeding occurred with no hematoma that resolved with 15 minutes of pressure; no further bleeding visualized at the completion of the procedure   Transplant Kidney highly vascular in all poles    The patient will be closely watched in the recovery area, kept flat for 6 hours while wearing an abdominal binder, and will have a repeat CBC and ultrasound in 4 hours to insure no hemodynamically significant bleeding post-biopsy.    SPECIMEN(S):  Core #1: 1.1x 0.1 x 0.1 (IF 0.2, LM 0.8, EM 0.1)

## 2019-12-29 NOTE — Unmapped (Signed)
Assessment/Plan:    Carolyn Kelley is a 29 y.o. female who will undergo Ultrasound-guided transplant kidney biopsy    1. Indications and risks/benefits of procedure reviewed with patient.    2. Consent signed and present on patient's charge.   3. No cardiopulmonary or other medical contraindications present therefore will proceed with biopsy.       CC: Proteinuria in Kidney transplant     HPI: [name] is a 29 y.o. female who will undergo Ultrasound-guided transplant kidney biopsy with moderate sedation.    Allergies:   Allergies   Allergen Reactions   ??? Bee Pollen Swelling     Sneezing, congestion  denies swelling.  Sneezing, congestion  denies swelling.     ??? Pollen Extracts Swelling       Medications:   Current Outpatient Medications   Medication Sig Dispense Refill   ??? aspirin (ECOTRIN) 81 MG tablet Take 1 tablet (81 mg total) by mouth daily. 30 tablet 11   ??? cholecalciferol, vitamin D3-125 mcg, 5,000 unit,, 125 mcg (5,000 unit) capsule Take 1 capsule (125 mcg total) by mouth daily. 100 capsule 11   ??? clonazePAM (KLONOPIN) 0.5 MG tablet Take 1 tablet (0.5 mg total) by mouth two (2) times a day. 60 tablet 3   ??? clonazePAM (KLONOPIN) 0.5 MG tablet Take 1 tablet (0.5 mg total) by mouth two (2) times a day. 60 tablet 0   ??? levETIRAcetam (KEPPRA) 250 MG tablet TAKE 1 TABLET BY MOUTH TWICE DAILY. 180 tablet 3   ??? magnesium oxide-Mg AA chelate (MAGNESIUM, AMINO ACID CHELATE,) 133 mg Tab Take 2 tablets by mouth Two (2) times a day. 120 tablet 11   ??? mycophenolate (MYFORTIC) 180 MG EC tablet Take 3 tablets (540 mg total) by mouth Two (2) times a day. Adjust dose per medication card. 180 tablet 11   ??? tacrolimus (ENVARSUS XR) 1 mg Tb24 extended release tablet Take ONE (1mg ) tablet with TWO (4mg ) tablets by mouth daily for a total daily dose of 9mg . 30 tablet 11   ??? acetaminophen (TYLENOL) 500 MG tablet Take 1-2 tablets (500-1,000 mg total) by mouth every six (6) hours as needed for pain or fever (> 38C). (Patient not taking: Reported on 12/29/2019) 100 tablet 0   ??? sodium bicarbonate 650 mg tablet Take 1 tablet (650 mg total) by mouth Two (2) times a day. 60 tablet 11   ??? tacrolimus (ENVARSUS XR) 4 mg Tb24 extended release tablet Take TWO (4mg ) tablets with ONE (1mg ) tablet by mouth daily for a total daily dose of 9mg . 90 tablet 11     No current facility-administered medications for this encounter.       PMH:   Past Medical History:   Diagnosis Date   ??? 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 Depressive disorder, not elsewhere classified    ??? Migraine with aura, without mention of intractable migraine without mention of status migrainosus    ??? Seizures (CMS-HCC)    ??? Unspecified vitamin B deficiency        ASA Grade: ASA 2 - Patient with mild systemic disease with no functional limitations    ROS:  General: Denies fever or chills.  Cardiovascular: Denies chest pain.   Pulmonary: Denies shortness of breath, snoring, sleep apnea, or respiratory infection.    Allergies:   Allergies   Allergen Reactions   ??? Bee Pollen Swelling     Sneezing, congestion  denies swelling.  Sneezing, congestion  denies swelling.     ???  Pollen Extracts Swelling           PE:    Vitals:    12/29/19 0833   BP: 122/90   Pulse: 90   Resp: 17   Temp: 36.8 ??C     General:   female in NAD.  Airway assessment: Class 1 - Can visualize soft palate, fauces, uvula, and tonsillar pillars  Cardiovascular:  Regular rate and rhythm.  No murmurs, gallops or rubs.   Lungs: respirations nonlabored; clear to auscultation bilaterally

## 2020-01-03 LAB — RENAL FUNCTION PANEL
ALBUMIN: 4.2 g/dL (ref 3.9–5.0)
BLOOD UREA NITROGEN: 28 mg/dL — ABNORMAL HIGH (ref 6–20)
BUN / CREAT RATIO: 27 — ABNORMAL HIGH (ref 9–23)
CHLORIDE: 106 mmol/L (ref 96–106)
CO2: 19 mmol/L — ABNORMAL LOW (ref 20–29)
CREATININE: 1.04 mg/dL — ABNORMAL HIGH (ref 0.57–1.00)
GFR MDRD AF AMER: 83 mL/min/{1.73_m2}
GFR MDRD NON AF AMER: 72 mL/min/{1.73_m2}
GLUCOSE: 96 mg/dL (ref 65–99)
POTASSIUM: 5.1 mmol/L (ref 3.5–5.2)
SODIUM: 137 mmol/L (ref 134–144)

## 2020-01-03 LAB — CBC W/ DIFFERENTIAL
BANDED NEUTROPHILS ABSOLUTE COUNT: 0.2 10*3/uL — ABNORMAL HIGH (ref 0.0–0.1)
BASOPHILS ABSOLUTE COUNT: 0.1 10*3/uL (ref 0.0–0.2)
BASOPHILS RELATIVE PERCENT: 1 %
EOSINOPHILS ABSOLUTE COUNT: 0.1 10*3/uL (ref 0.0–0.4)
EOSINOPHILS RELATIVE PERCENT: 3 %
HEMOGLOBIN: 10.7 g/dL — ABNORMAL LOW (ref 11.1–15.9)
IMMATURE GRANULOCYTES: 5 %
LYMPHOCYTES ABSOLUTE COUNT: 0.7 10*3/uL (ref 0.7–3.1)
LYMPHOCYTES RELATIVE PERCENT: 16 %
MEAN CORPUSCULAR HEMOGLOBIN CONC: 32.1 g/dL (ref 31.5–35.7)
MEAN CORPUSCULAR HEMOGLOBIN: 29.6 pg (ref 26.6–33.0)
MEAN CORPUSCULAR VOLUME: 92 fL (ref 79–97)
MONOCYTES ABSOLUTE COUNT: 0.4 10*3/uL (ref 0.1–0.9)
MONOCYTES RELATIVE PERCENT: 10 %
NEUTROPHILS ABSOLUTE COUNT: 2.7 10*3/uL (ref 1.4–7.0)
NEUTROPHILS RELATIVE PERCENT: 65 %
PLATELET COUNT: 259 10*3/uL (ref 150–450)
RED BLOOD CELL COUNT: 3.62 x10E6/uL — ABNORMAL LOW (ref 3.77–5.28)
RED CELL DISTRIBUTION WIDTH: 12.9 % (ref 11.7–15.4)
WHITE BLOOD CELL COUNT: 4.2 10*3/uL (ref 3.4–10.8)

## 2020-01-03 LAB — MONOCYTES ABSOLUTE COUNT: Monocytes:NCnc:Pt:Bld:Qn:Automated count: 0.4

## 2020-01-03 LAB — MAGNESIUM: Magnesium:MCnc:Pt:Ser/Plas:Qn:: 1.9

## 2020-01-03 LAB — POTASSIUM: Potassium:SCnc:Pt:Ser/Plas:Qn:: 5.1

## 2020-01-04 LAB — TACROLIMUS BLOOD: Tacrolimus:MCnc:Pt:Bld:Qn:LC/MS/MS: 7.1

## 2020-01-05 DIAGNOSIS — Z940 Kidney transplant status: Principal | ICD-10-CM

## 2020-01-05 MED ORDER — MYCOPHENOLATE SODIUM 180 MG TABLET,DELAYED RELEASE
ORAL_TABLET | Freq: Two times a day (BID) | ORAL | 11 refills | 23.00000 days | Status: CP
Start: 2020-01-05 — End: 2021-01-04

## 2020-01-05 MED ORDER — PREDNISONE 5 MG TABLET
ORAL_TABLET | Freq: Every day | ORAL | 11 refills | 30 days | Status: CP
Start: 2020-01-05 — End: ?

## 2020-01-05 NOTE — Unmapped (Signed)
Per dr detwiler increase myfortic 720 bid and prednisone 5mg  daily    Called and updated patient. Will send prednisone to local pharmacy and myfortic to [facility]    Denies any other needs

## 2020-01-08 DIAGNOSIS — Z940 Kidney transplant status: Principal | ICD-10-CM

## 2020-01-08 MED ORDER — MYCOPHENOLATE SODIUM 180 MG TABLET,DELAYED RELEASE
ORAL_TABLET | Freq: Two times a day (BID) | ORAL | 11 refills | 30.00000 days | Status: CP
Start: 2020-01-08 — End: 2021-01-07
  Filled 2020-01-10: qty 240, 30d supply, fill #0

## 2020-01-08 NOTE — Unmapped (Signed)
Clinical Assessment Needed For: Dose Change  Medication: Mycophenolate  Last Fill Date/Day Supply: 12/18/2019 / 30 day supply  Copay $0.00  Was previous dose already scheduled to fill: No    Notes to Pharmacist:

## 2020-01-09 NOTE — Unmapped (Signed)
[facility] Refill Coordination Note    Specialty Medication(s) to be Shipped:   Transplant: Envarsus 1mg , Envarsus 4mg  and  mycophenolic acid 180mg     Other medication(s) to be shipped: aspirin 81mg , vitamin d 5,000 unit capsule, clonazepam 0.5mg , magnesium 133mg , sodium bicarb 650mg      Carolyn Kelley, DOB: 1990-08-24  Phone: [phone] (home)       All above HIPAA information was verified with patient.     Was a Nurse, learning disability used for this call? No    Completed refill call assessment today to schedule patient's medication shipment from the [facility] [phone]).       Specialty medication(s) and dose(s) confirmed: Patient reports changes to the regimen as follows: Mycophenolate 180mg  increased to 4 tabs twice daily   Changes to medications: Vanda reports no changes at this time.  Changes to insurance: No  Questions for the pharmacist: No    Confirmed patient received Welcome Packet with first shipment. The patient will receive a drug information handout for each medication shipped and additional FDA Medication Guides as required.       DISEASE/MEDICATION-SPECIFIC INFORMATION        N/A    SPECIALTY MEDICATION ADHERENCE     Medication Adherence    Patient reported X missed doses in the last month: 0  Specialty Medication: Envarsus 1mg   Patient is on additional specialty medications: Yes  Additional Specialty Medications: Envarsus 4mg   Patient Reported Additional Medication X Missed Doses in the Last Month: 0  Patient is on more than two specialty medications: Yes  Specialty Medication: Mycophenolate 180mg   Patient Reported Additional Medication X Missed Doses in the Last Month: 0  Informant: patient                Envarsus 1 mg: 10 days of medicine on hand   Envarsus 4 mg: 10 days of medicine on hand   Mycophenolate 180 mg: 10 days of medicine on hand         SHIPPING     Shipping address confirmed in Epic.     Delivery Scheduled: Yes, Expected medication delivery date: 01/11/20.     Medication will be delivered via UPS to the prescription address in Epic WAM.    Carolyn Kelley   [facility] Specialty Technician

## 2020-01-10 LAB — RENAL FUNCTION PANEL
BUN / CREAT RATIO: 30 — ABNORMAL HIGH (ref 9–23)
CALCIUM: 10 mg/dL (ref 8.7–10.2)
CHLORIDE: 107 mmol/L — ABNORMAL HIGH (ref 96–106)
CO2: 20 mmol/L (ref 20–29)
CREATININE: 1.06 mg/dL — ABNORMAL HIGH (ref 0.57–1.00)
GFR MDRD AF AMER: 81 mL/min/{1.73_m2}
GLUCOSE: 86 mg/dL (ref 65–99)
PHOSPHORUS, SERUM: 4.4 mg/dL — ABNORMAL HIGH (ref 3.0–4.3)
POTASSIUM: 5.2 mmol/L (ref 3.5–5.2)
SODIUM: 140 mmol/L (ref 134–144)

## 2020-01-10 LAB — CBC W/ DIFFERENTIAL
BANDED NEUTROPHILS ABSOLUTE COUNT: 0.1 10*3/uL (ref 0.0–0.1)
BASOPHILS ABSOLUTE COUNT: 0 10*3/uL (ref 0.0–0.2)
BASOPHILS RELATIVE PERCENT: 1 %
EOSINOPHILS ABSOLUTE COUNT: 0.1 10*3/uL (ref 0.0–0.4)
EOSINOPHILS RELATIVE PERCENT: 3 %
HEMATOCRIT: 34.2 % (ref 34.0–46.6)
HEMOGLOBIN: 10.7 g/dL — ABNORMAL LOW (ref 11.1–15.9)
IMMATURE GRANULOCYTES: 3 %
LYMPHOCYTES ABSOLUTE COUNT: 0.7 10*3/uL (ref 0.7–3.1)
LYMPHOCYTES RELATIVE PERCENT: 19 %
MEAN CORPUSCULAR HEMOGLOBIN: 28.9 pg (ref 26.6–33.0)
MONOCYTES ABSOLUTE COUNT: 0.4 10*3/uL (ref 0.1–0.9)
MONOCYTES RELATIVE PERCENT: 10 %
NEUTROPHILS ABSOLUTE COUNT: 2.5 10*3/uL (ref 1.4–7.0)
NEUTROPHILS RELATIVE PERCENT: 64 %
PLATELET COUNT: 247 10*3/uL (ref 150–450)
RED BLOOD CELL COUNT: 3.7 x10E6/uL — ABNORMAL LOW (ref 3.77–5.28)
RED CELL DISTRIBUTION WIDTH: 12.8 % (ref 11.7–15.4)
WHITE BLOOD CELL COUNT: 3.9 10*3/uL (ref 3.4–10.8)

## 2020-01-10 LAB — ALBUMIN: Albumin:MCnc:Pt:Ser/Plas:Qn:: 4.2

## 2020-01-10 LAB — BANDED NEUTROPHILS ABSOLUTE COUNT: Granulocytes.immature:NCnc:Pt:Bld:Qn:Automated count: 0.1

## 2020-01-10 LAB — MAGNESIUM: Magnesium:MCnc:Pt:Ser/Plas:Qn:: 1.9

## 2020-01-10 MED FILL — MYCOPHENOLATE SODIUM 180 MG TABLET,DELAYED RELEASE: 30 days supply | Qty: 240 | Fill #0 | Status: AC

## 2020-01-10 MED FILL — MG-PLUS-PROTEIN 133 MG TABLET: 30 days supply | Qty: 120 | Fill #9 | Status: AC

## 2020-01-10 MED FILL — MG-PLUS-PROTEIN 133 MG TABLET: ORAL | 30 days supply | Qty: 120 | Fill #9

## 2020-01-10 MED FILL — CHOLECALCIFEROL (VITAMIN D3) 125 MCG (5,000 UNIT) CAPSULE: ORAL | 100 days supply | Qty: 100 | Fill #3

## 2020-01-10 MED FILL — ENVARSUS XR 4 MG TABLET,EXTENDED RELEASE: 30 days supply | Qty: 60 | Fill #4 | Status: AC

## 2020-01-10 MED FILL — ENVARSUS XR 1 MG TABLET,EXTENDED RELEASE: 30 days supply | Qty: 30 | Fill #4

## 2020-01-10 MED FILL — CHOLECALCIFEROL (VITAMIN D3) 125 MCG (5,000 UNIT) CAPSULE: 100 days supply | Qty: 100 | Fill #3 | Status: AC

## 2020-01-10 MED FILL — ENVARSUS XR 4 MG TABLET,EXTENDED RELEASE: 30 days supply | Qty: 60 | Fill #4

## 2020-01-10 MED FILL — ASPIRIN 81 MG TABLET,DELAYED RELEASE: ORAL | 90 days supply | Qty: 90 | Fill #3

## 2020-01-10 MED FILL — ENVARSUS XR 1 MG TABLET,EXTENDED RELEASE: 30 days supply | Qty: 30 | Fill #4 | Status: AC

## 2020-01-10 MED FILL — ASPIRIN 81 MG TABLET,DELAYED RELEASE: 90 days supply | Qty: 90 | Fill #3 | Status: AC

## 2020-01-10 MED FILL — SODIUM BICARBONATE 650 MG TABLET: ORAL | 30 days supply | Qty: 60 | Fill #3

## 2020-01-10 MED FILL — SODIUM BICARBONATE 650 MG TABLET: 30 days supply | Qty: 60 | Fill #3 | Status: AC

## 2020-01-12 LAB — TACROLIMUS BLOOD: Tacrolimus:MCnc:Pt:Bld:Qn:LC/MS/MS: 7.5

## 2020-01-12 LAB — CMV QUANT: Cytomegalovirus DNA:ACnc:Pt:Plas:Qn:Probe.amp.tar: POSITIVE

## 2020-01-15 DIAGNOSIS — Z940 Kidney transplant status: Principal | ICD-10-CM

## 2020-01-17 LAB — RENAL FUNCTION PANEL
ALBUMIN: 4.4 g/dL (ref 3.9–5.0)
BLOOD UREA NITROGEN: 30 mg/dL — ABNORMAL HIGH (ref 6–20)
CHLORIDE: 105 mmol/L (ref 96–106)
CO2: 19 mmol/L — ABNORMAL LOW (ref 20–29)
CREATININE: 1.26 mg/dL — ABNORMAL HIGH (ref 0.57–1.00)
GFR MDRD AF AMER: 66 mL/min/{1.73_m2}
GFR MDRD NON AF AMER: 57 mL/min/{1.73_m2} — ABNORMAL LOW
GLUCOSE: 92 mg/dL (ref 65–99)
PHOSPHORUS, SERUM: 4.6 mg/dL — ABNORMAL HIGH (ref 3.0–4.3)
SODIUM: 139 mmol/L (ref 134–144)

## 2020-01-17 LAB — CBC W/ DIFFERENTIAL
BANDED NEUTROPHILS ABSOLUTE COUNT: 0.2 10*3/uL — ABNORMAL HIGH (ref 0.0–0.1)
BASOPHILS ABSOLUTE COUNT: 0 10*3/uL (ref 0.0–0.2)
BASOPHILS RELATIVE PERCENT: 1 %
EOSINOPHILS ABSOLUTE COUNT: 0.1 10*3/uL (ref 0.0–0.4)
EOSINOPHILS RELATIVE PERCENT: 2 %
HEMATOCRIT: 33.8 % — ABNORMAL LOW (ref 34.0–46.6)
HEMOGLOBIN: 10.7 g/dL — ABNORMAL LOW (ref 11.1–15.9)
IMMATURE GRANULOCYTES: 5 %
LYMPHOCYTES ABSOLUTE COUNT: 1.1 10*3/uL (ref 0.7–3.1)
LYMPHOCYTES RELATIVE PERCENT: 24 %
MEAN CORPUSCULAR HEMOGLOBIN CONC: 31.7 g/dL (ref 31.5–35.7)
MEAN CORPUSCULAR HEMOGLOBIN: 28.8 pg (ref 26.6–33.0)
MEAN CORPUSCULAR VOLUME: 91 fL (ref 79–97)
MONOCYTES RELATIVE PERCENT: 10 %
NEUTROPHILS ABSOLUTE COUNT: 2.8 10*3/uL (ref 1.4–7.0)
PLATELET COUNT: 263 10*3/uL (ref 150–450)
RED BLOOD CELL COUNT: 3.71 x10E6/uL — ABNORMAL LOW (ref 3.77–5.28)
RED CELL DISTRIBUTION WIDTH: 13 % (ref 11.7–15.4)

## 2020-01-17 LAB — PHOSPHORUS, SERUM: Phosphate:MCnc:Pt:Ser/Plas:Qn:: 4.6 — ABNORMAL HIGH

## 2020-01-17 LAB — PROTEIN / CREATININE RATIO, URINE
CREATININE URINE: 106.7 mg/dL
PROTEIN URINE: 74.8 mg/dL

## 2020-01-17 LAB — MAGNESIUM: Magnesium:MCnc:Pt:Ser/Plas:Qn:: 1.7

## 2020-01-17 LAB — MEAN CORPUSCULAR HEMOGLOBIN CONC: Erythrocyte mean corpuscular hemoglobin concentration:MCnc:Pt:RBC:Qn:Automated count: 31.7

## 2020-01-17 LAB — PROTEIN URINE: Protein:MCnc:Pt:Urine:Qn:: 74.8

## 2020-01-17 MED FILL — CLONAZEPAM 0.5 MG TABLET: 30 days supply | Qty: 60 | Fill #1 | Status: AC

## 2020-01-17 MED FILL — CLONAZEPAM 0.5 MG TABLET: 30 days supply | Qty: 60 | Fill #1

## 2020-01-18 LAB — TACROLIMUS LEVEL: TACROLIMUS BLOOD: 7.4 ng/mL (ref 2.0–20.0)

## 2020-01-22 DIAGNOSIS — Z940 Kidney transplant status: Principal | ICD-10-CM

## 2020-01-23 DIAGNOSIS — Z940 Kidney transplant status: Principal | ICD-10-CM

## 2020-01-25 NOTE — Unmapped (Unsigned)
[facility] TRANSPL[facility] PHARMACY NOTE  01/25/2020   Carolyn Kelley  [id number]    Questions/notes for today:  - ~10 months s/p kidney txp  - Last clinic visit 9/8 w/ [place]wiler - proteinuria to 2 g/g (Scr at [facility]) so scheduled renal bx  - Kidney bx on 10/1 - Recurrent mesangiopathic C3 glomerulopathy  - Tremors stable (had tremor pre-txp)?  - HA still controlled w/ Tylenol?  - Watch CO2 and Mg (on supplementation)?  - Contraception? Referred to GYN at last visit...    Medication changes today:   1. None    Education/Adherence tools provided today:  1. provided updated medication list  2. provided additional pill box education  3. provided additional education on immunosuppression and transplant related medications including reviewing indications of medications, dosing and side effects    Follow up items:  1. goal of understanding indications and dosing of immunosuppression medications    Next visit with pharmacy in 1-3 months  ____________________________________________________________________    Carolyn Kelley is a 29 y.o. 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 ***    Allergies   Allergen Reactions   ??? Bee Pollen Swelling     Sneezing, congestion  denies swelling.  Sneezing, congestion  denies swelling.     ??? Pollen Extracts Swelling       Medications reviewed in EPIC medication station and updated today by the clinical pharmacist practitioner.    Outpatient Encounter Medications as of 01/26/2020   Medication Sig Dispense Refill   ??? acetaminophen (TYLENOL) 500 MG tablet Take 1-2 tablets (500-1,000 mg total) by mouth every congestion  denies swelling.     ??? Pollen Extracts Swelling       Medications reviewed in EPIC medication station and updated today by the clinical pharmacist practitioner.    Outpatient Encounter Medications as of 01/26/2020   Medication Sig Dispense Refill   ??? acetaminophen (TYLENOL) 500 MG tablet Take 1-2 tablets (500-1,000 mg total) by mouth every six (6) hours as needed for pain or fever (> 38C). (Patient not taking: Reported on 12/29/2019) 100 tablet 0   ??? aspirin (ECOTRIN) 81 MG tablet Take 1 tablet (81 mg total) by mouth daily. 30 tablet 11   ??? cholecalciferol, vitamin D3-125 mcg, 5,000 unit,, 125 mcg (5,000 unit) capsule Take 1 capsule (125 mcg total) by mouth daily. 100 capsule 11   ??? clonazePAM (KLONOPIN) 0.5 MG tablet Take 1 tablet (0.5 mg total) by mouth two (2) times a day. 60 tablet 3   ??? clonazePAM (KLONOPIN) 0.5 MG tablet Take 1 tablet (0.5 mg total) by mouth two (2) times a day. 60 tablet 0   ??? levETIRAcetam (KEPPRA) 250 MG tablet TAKE 1 TABLET BY MOUTH TWICE DAILY. 180 tablet 3   ??? magnesium oxide-Mg AA chelate (MAGNESIUM, AMINO ACID CHELATE,) 133 mg Tab Take 2 tablets by mouth Two (2) times a day. 120 tablet 11   ??? mycophenolate (MYFORTIC) 180 MG EC tablet Take 4 tablets (720 mg total) by mouth Two (2) times a day. Adjust dose per medication card. 240 tablet  11   ??? predniSONE (DELTASONE) 5 MG tablet Take 1 tablet (5 mg total) by mouth daily. 30 tablet 11   ??? sodium bicarbonate 650 mg tablet Take 1 tablet (650 mg total) by mouth Two (2) times a day. 60 tablet 11   ??? tacrolimus (ENVARSUS XR) 1 mg Tb24 extended release tablet Take ONE (1mg ) tablet with TWO (4mg ) tablets by mouth daily for a total daily dose of 9mg . 30 tablet 11   ??? tacrolimus (ENVARSUS XR) 4 mg Tb24 extended release tablet Take TWO (4mg ) tablets with ONE (1mg ) tablet by mouth daily for a total daily dose of 9mg . 90 tablet 11     No facility-administered encounter medications on file as of 01/26/2020.     Immunosuppression:    Induction Trough 6.6 08/16/2019    Tacrolimus, Trough 4.1 (L) 06/30/2019    Tacrolimus Lvl 7.4 01/16/2020    Tacrolimus Lvl 7.5 01/09/2020    Tacrolimus Lvl 7.1 01/02/2020     No results found for: CYCLO  No results found for: EVEROLIMUS  No results found for: SIROLIMUS    Envarsus level is accurate 24 hour trough, *** takes medication at 11am    Graft function: stable  DSA: ntd  Zero hour biopsy: without diagnostic abnormalities  Biopsies to date:  ?? 12/29/19: recurrent mesangiopathic C3 glomerulopathy  UPC: not detected 05/16/19  WBC/ANC:  low but stable    Plan: Will maintain current immunosuppression. Continue to monitor closely.    OI Prophylaxis:   CMV Status: D+/ R+, moderate risk . CMV prophylaxis: completed valganciclovir 450 mg daily x 3 months per protocol  Lab Results   Component Value Date    CMV Quant Positive < 200 01/09/2020    CMV Quant Negative 12/26/2019    CMV Quant Negative 12/12/2019    CMV Quant Negative 12/05/2019    CMV Quant <50 (H) 11/29/2019    CMV Quant Negative 11/14/2019    CMV Quant Negative 10/31/2019      CrCl cannot be calculated (Unknown ideal weight.).  PCP Prophylaxis: completed bactrim SS 1 tab MWF x 6 months per protocol  Thrush: completed in hospital  Patient is  leukopenic      Plan: Continue per protocol. Continue to monitor.    CV Prophylaxis:  Aspirin therapy: asa 81 mg   The ASCVD Risk score Denman George DC Montez Hageman, et al., 2013) failed to calculate.  Statin therapy: Not indicated    Plan:  Continue to monitor     BP: Goal < 140/90. Clinic vitals reported above  Home BP ranges: 115-120s/80s  Current meds include: none    Plan: within goal. Continue to monitor    Anemia of CKD:  H/H:   Lab Results   Component Value Date    HGB 10.7 (L) 01/16/2020     Lab Results   Component Value Date    HCT 33.8 (L) 01/16/2020     Iron panel:  Lab Results   Component Value Date    IRON 81 11/29/2019    TIBC 280 11/29/2019    FERRITIN 588.0 (H) 12/23/2015     Lab Results   Component Value Date    Iron Saturation (%) 29 11/29/2019    Iron Saturation (%) 26 04/29/2011     Prior ESA use: none post transplant    Plan: H/H stable. Continue to monitor.     DM:   Lab Results   Component Value Date    A1C <3.8 (L) 11/29/2019   Goal  A1c < 7%  FBG: mostly in 80s  History of Dm? No    Plan:  Continue to monitor.    Fluid Intake: 64 oz daily    Plan: Continue adequate fluid intake    Electrolytes: wnl  Current meds include: Mg plus protein 266 mg BID    Plan: Continue to monitor.    GI/BM: pt reports 1-2BM daily, occasional diarrhea (1x/week)  Current meds include: none    Plan: Continue to monitor.    Pain: pt reports moderate mouth pain after recent dentist visit  Current meds include: APAP PRN (2 tabs yesterday for a HA)    Plan: Dr. Margaretmary Bayley asked pt to make follow up apt with dentist. Continue to monitor.    Bone health:   Vitamin D Level: last level is 10.9 on 03/23/19. Goal > 30.   Last DEXA results:  none available  Current meds include: ergocalciferol 50,000 units weekly x12 weeks (started on 1/26).    Plan: Vitamin D level  out of goal. Continue supplementation. Once finishes ergocalciferol can transition to cholecalciferol 5,000 units daily  Continue to monitor.     Women's/Men's Health:  Lazette Estala is a 29 y.o. Female of childbearing age. Patient reports no men's/women's health issues.     Plan: Continue to monitor. Reports she is not sexually active at this time and understands that she should inform us if she were to become sexually active given need for contraception with Myfortic.    Seizure history: last seizure while hospitalized in 2014  Current meds include: Keppra 250 mg BID, clonazepam 0.5 mg BID    Plan: continue to monitor    Pharmacy preference:  [facility]    Adherence: Patient has average understanding of medications; was able to independently identify names/doses of immunosuppressants and OI meds.  Patient  does fill their own pill box on a regular basis at home.  Patient brought medication card: yes  Pill box: did not bring    Plan: Provided moderate adherence counseling/intervention.    I spent a total of *** 15 minutes face to face with the patient delivering clinical care and providing education/counseling.    Patient was reviewed with *** Dr. Margaretmary Bayley who was agreement with the stated plan:     During this visit, the following was completed:   BP log data assessment  Labs ordered and evaluated  complex treatment plan >1 DS     All questions/concerns were addressed to the patient's satisfaction.  __________________________________________  Damita Dunnings, PharmD, CPP, [facility]

## 2020-01-26 ENCOUNTER — Ambulatory Visit
Admit: 2020-01-26 | Discharge: 2020-01-26 | Payer: PRIVATE HEALTH INSURANCE | Attending: Nephrology | Primary: Nephrology

## 2020-01-26 ENCOUNTER — Ambulatory Visit: Admit: 2020-01-26 | Discharge: 2020-01-26 | Payer: PRIVATE HEALTH INSURANCE

## 2020-01-26 ENCOUNTER — Institutional Professional Consult (permissible substitution): Admit: 2020-01-26 | Discharge: 2020-01-26 | Payer: PRIVATE HEALTH INSURANCE

## 2020-01-26 DIAGNOSIS — I10 Essential (primary) hypertension: Principal | ICD-10-CM

## 2020-01-26 DIAGNOSIS — Z7982 Long term (current) use of aspirin: Principal | ICD-10-CM

## 2020-01-26 DIAGNOSIS — N189 Chronic kidney disease, unspecified: Principal | ICD-10-CM

## 2020-01-26 DIAGNOSIS — N281 Cyst of kidney, acquired: Principal | ICD-10-CM

## 2020-01-26 DIAGNOSIS — E875 Hyperkalemia: Principal | ICD-10-CM

## 2020-01-26 DIAGNOSIS — I129 Hypertensive chronic kidney disease with stage 1 through stage 4 chronic kidney disease, or unspecified chronic kidney disease: Principal | ICD-10-CM

## 2020-01-26 DIAGNOSIS — N05A C3 glomerulonephritis of transplanted kidney: Secondary | ICD-10-CM

## 2020-01-26 DIAGNOSIS — Z23 Encounter for immunization: Principal | ICD-10-CM

## 2020-01-26 DIAGNOSIS — E872 Acidosis: Principal | ICD-10-CM

## 2020-01-26 DIAGNOSIS — G40909 Epilepsy, unspecified, not intractable, without status epilepticus: Principal | ICD-10-CM

## 2020-01-26 DIAGNOSIS — D849 Immunodeficiency, unspecified: Principal | ICD-10-CM

## 2020-01-26 DIAGNOSIS — Z4822 Encounter for aftercare following kidney transplant: Principal | ICD-10-CM

## 2020-01-26 DIAGNOSIS — Z940 Kidney transplant status: Principal | ICD-10-CM

## 2020-01-26 DIAGNOSIS — K529 Noninfective gastroenteritis and colitis, unspecified: Principal | ICD-10-CM

## 2020-01-26 DIAGNOSIS — T8619 Other complication of kidney transplant: Principal | ICD-10-CM

## 2020-01-26 DIAGNOSIS — D631 Anemia in chronic kidney disease: Principal | ICD-10-CM

## 2020-01-26 DIAGNOSIS — N058 Unspecified nephritic syndrome with other morphologic changes: Principal | ICD-10-CM

## 2020-01-26 LAB — LIPID PANEL
CHOLESTEROL/HDL RATIO SCREEN: 3.3 (ref 1.0–4.5)
CHOLESTEROL: 139 mg/dL (ref <=200)
HDL CHOLESTEROL: 42 mg/dL (ref 40–60)
NON-HDL CHOLESTEROL: 97 mg/dL (ref 70–130)
TRIGLYCERIDES: 96 mg/dL (ref 0–150)
VLDL CHOLESTEROL CAL: 19.2 mg/dL (ref 8–32)

## 2020-01-26 LAB — CBC W/ AUTO DIFF
BASOPHILS ABSOLUTE COUNT: 0 10*9/L (ref 0.0–0.1)
BASOPHILS RELATIVE PERCENT: 0.1 %
EOSINOPHILS ABSOLUTE COUNT: 0.1 10*9/L (ref 0.0–0.7)
EOSINOPHILS RELATIVE PERCENT: 2.4 %
HEMOGLOBIN: 10.8 g/dL — ABNORMAL LOW (ref 12.0–15.5)
LYMPHOCYTES ABSOLUTE COUNT: 0.8 10*9/L (ref 0.7–4.0)
LYMPHOCYTES RELATIVE PERCENT: 18.3 %
MEAN CORPUSCULAR HEMOGLOBIN CONC: 32.8 g/dL (ref 30.0–36.0)
MEAN CORPUSCULAR HEMOGLOBIN: 29.8 pg (ref 26.0–34.0)
MEAN CORPUSCULAR VOLUME: 91 fL (ref 82.0–98.0)
MEAN PLATELET VOLUME: 7.8 fL (ref 7.0–10.0)
MONOCYTES ABSOLUTE COUNT: 0.4 10*9/L (ref 0.1–1.0)
MONOCYTES RELATIVE PERCENT: 7.7 %
NEUTROPHILS ABSOLUTE COUNT: 3.3 10*9/L (ref 1.7–7.7)
NEUTROPHILS RELATIVE PERCENT: 71.5 %
NUCLEATED RED BLOOD CELLS: 0 /100{WBCs} (ref <=4)
PLATELET COUNT: 226 10*9/L (ref 150–450)
RED CELL DISTRIBUTION WIDTH: 14.1 % (ref 12.0–15.0)
WBC ADJUSTED: 4.6 10*9/L (ref 3.5–10.5)

## 2020-01-26 LAB — CLARITY

## 2020-01-26 LAB — EOSINOPHILS ABSOLUTE COUNT: Eosinophils:NCnc:Pt:Bld:Qn:Automated count: 0.1

## 2020-01-26 LAB — BASIC METABOLIC PANEL
ANION GAP: 4 mmol/L — ABNORMAL LOW (ref 5–14)
BLOOD UREA NITROGEN: 36 mg/dL — ABNORMAL HIGH (ref 9–23)
BUN / CREAT RATIO: 30
CALCIUM: 10.5 mg/dL — ABNORMAL HIGH (ref 8.7–10.4)
CO2: 24.9 mmol/L (ref 20.0–31.0)
CREATININE: 1.21 mg/dL — ABNORMAL HIGH
EGFR CKD-EPI AA FEMALE: 69 mL/min/{1.73_m2} (ref >=60)
EGFR CKD-EPI NON-AA FEMALE: 60 mL/min/{1.73_m2} (ref >=60)
POTASSIUM: 4.4 mmol/L (ref 3.4–4.5)
SODIUM: 137 mmol/L (ref 135–145)

## 2020-01-26 LAB — URINALYSIS
BILIRUBIN UA: NEGATIVE
BLOOD UA: NEGATIVE
GLUCOSE UA: NEGATIVE
KETONES UA: NEGATIVE
LEUKOCYTE ESTERASE UA: NEGATIVE
NITRITE UA: NEGATIVE
PH UA: 6 (ref 5.0–9.0)
PROTEIN UA: NEGATIVE
RBC UA: <1 /HPF (ref <4)
SPECIFIC GRAVITY UA: >=1.03 (ref 1.005–1.030)
UROBILINOGEN UA: 0.2
WBC UA: 2 /HPF (ref 0–5)

## 2020-01-26 LAB — C4 COMPLEMENT: Complement C4:MCnc:Pt:Ser/Plas:Qn:: 30.3

## 2020-01-26 LAB — PARATHYROID HORMONE INTACT: Parathyrin.intact:MCnc:Pt:Ser/Plas:Qn:: 189.6 — ABNORMAL HIGH

## 2020-01-26 LAB — SMEAR REVIEW

## 2020-01-26 LAB — BLOOD UREA NITROGEN: Urea nitrogen:MCnc:Pt:Ser/Plas:Qn:: 36 — ABNORMAL HIGH

## 2020-01-26 LAB — FASTING

## 2020-01-26 LAB — PHOSPHORUS: Phosphate:MCnc:Pt:Ser/Plas:Qn:: 3.8

## 2020-01-26 LAB — C3 COMPLEMENT: Complement C3:MCnc:Pt:Ser/Plas:Qn:: 82 — ABNORMAL LOW

## 2020-01-26 LAB — PROTEIN / CREATININE RATIO, URINE: CREATININE, URINE: 169.2 mg/dL

## 2020-01-26 LAB — CREATININE, URINE: Lab: 169.2

## 2020-01-26 LAB — MAGNESIUM: Magnesium:MCnc:Pt:Ser/Plas:Qn:: 1.8

## 2020-01-26 MED ORDER — MENACTRA (PF) 4 MCG/0.5 ML INTRAMUSCULAR SOLUTION
Freq: Once | INTRAMUSCULAR | 0 refills | 1.00000 days | Status: CP
Start: 2020-01-26 — End: 2020-01-26

## 2020-01-26 MED ORDER — SODIUM BICARBONATE 650 MG TABLET
ORAL_TABLET | Freq: Two times a day (BID) | ORAL | 11 refills | 30.00000 days | Status: CP
Start: 2020-01-26 — End: 2021-01-25
  Filled 2020-02-01: qty 120, 30d supply, fill #0

## 2020-01-26 MED ORDER — BEXSERO 50 MCG-50 MCG-50 MCG-25 MCG/0.5 ML INTRAMUSCULAR SYRINGE
Freq: Once | INTRAMUSCULAR | 0 refills | 1 days | Status: CP
Start: 2020-01-26 — End: 2020-01-26

## 2020-01-26 NOTE — Unmapped (Signed)
Saw patient in clinic with her mom. Reports she is doing well. In school full time for psychology degree.    Denies Dizziness, CP, heart palpitations, SOB, swelling, abdominal pain, reflux, n/v, urinary problems, or fevers    +tremors better. Always in her legs. Uses walker    Saw GYN next Friday    Diarrhea 1-2 times this month    HA are better    Drinks 4 16.9 oz fluids a day    Need labs today, last dose of envarsus at 11am yesterday

## 2020-01-26 NOTE — Unmapped (Signed)
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 C3, C4, and functional and genetic panels will be drawn today. We have previously increased her dose of Myfortic to 720 mg bid and started prednisone 5 mg daily. Serum creatinine is stable at 1.21 mg/dL and UPC has normalized today (0.096). If proteinuria worsens or creatinine increases will consider eculizumab. She has no history of rejection or DSAs (last DSA screen 11/29/19).     Immunosuppression Management [High Risk Medical Decision Making For Drug Therapy Requiring Intensive Monitoring For Toxicity]:   - Tacrolimus level was 7.4 on 01/16/20 (target 6-10 ng/mL).   - Envarsus 9 mg daily dose to be continued   - Higher dose Myfortic 720 mg bid to be continued due to C3 glomerulopathy recurrence  - Prednisone 5 mg daily started 01/05/20 due to C3 glomerulopathy recurrence, previously steroid free    Infectious Prophylaxis and Monitoring: CMV & EBV: D+/R+  - PJP prophylaxis: completed (s/p Bactrim x 6 months)  - CMV prophylaxis: completed (s/p Valcyte x3 months).   - COVID-19 vaccination: completed including 3rd dose 11/29/19. She is aware of the limited efficacy of this vaccine in transplant recipients.  - Meningococcal vaccine and pneumococcal vaccines will be administered today in preparation for possible eculizumab in the future.    Seizures Disorder, no seizures since 2014:    Will continue Keppra.  She will follow up with [name], NP at [facility].  Last visit on 05/23/2018, with yearly follow ups.     Myoclonus:   Will continue Klonopin, prescribed by PCP.  Will follow with [facility], continue to use walker and cane. Metabolic Acidosis and Hyperkalemia  Will increase sodium bicarbonate from 650 mg bid to 1300 mg bid today based on multiple recent serum CO2 values <22.    Pseudoaneurysm of AVF  She saw Dr. Norma Fredrickson 09/21/19 with plan to defer any intervention until 1 year has passed from transplant. She has no high output heart failure by echo or symptoms of CHF. We will coordinate with Dr. Norma Fredrickson. Favor a nonurgent revision to minimize any risk of rupture, but spare the AVF for future use if possible to do so safely. Patient advised to contact us with any scabbing or erosion of the skin over the pseudoaneurysm.    Health Maintenance:   Pap Smear: 12/08/18 showed [facility]). 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 She will see GYN next week.     Immunizations:   Immunization History   Administered Date(s) Administered   ??? COVID-19 VACC,MRNA,(PFIZER)(PF)(IM) 07/06/2019, 07/31/2019, 11/29/2019   ??? Hepatitis B, Adult 01/21/2012, 01/21/2012, 02/16/2012, 02/16/2012, 03/17/2012, 03/17/2012, 07/14/2012, 07/14/2012   ??? HiB, unspecified 04/29/2011   ??? INFLUENZA TIV (TRI) 23MO+ W/ PRESERV (IM) 03/31/2011   ??? Influenza Vaccine Quad (IIV4 PF) 39mo+ injectable 12/15/2018, 12/15/2018, 12/15/2018, 11/29/2019   ??? Influenza Vaccine Quad (IIV4 W/PRESERV) 23MO+ 12/16/2017   ??? Meningococcal C Conjugate 04/29/2011   ??? PNEUMOCOCCAL POLYSACCHARIDE 23 02/04/2012, 02/04/2012   ??? PPD Test 05/01/2011   ??? Pneumococcal Conjugate 13-Valent 02/23/2019, 02/23/2019     Childbearing age  Discussed the following on 11/29/19: Most pre-menopausal women experience return of fertility post-transplant, and a safe pregnancy is possible with careful planning.  Recommend using abstinence or a highly effective contraception to avoid pregnancy until planning pregnancy, and if planning pregnancy please notify nephrology so that we can adjust medications to avoid teratogens.    Follow up  4 weeks    History of Present Illness    She is a 29 y.o. 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 Her post-operative course was complicated by hypotension and hypoxia thought to be caused by anaphylactic reaction to Campath. She has experienced no episodes of rejection or infectious complications. Due to presence of proteinuria she underwent kidney biopsy on 12/29/19. The biopsy showed C3 glomerulopathy recurrence. In response to this diagnosis Myfortic was increased from 540 mg bid to 720 mg bid and she was started on prednisone 5 mg daily.     She presents today without new complaints. She has chronic intermittent loose stools, but this is not frequent and is not worse since increasing her Myfortic dose. She denies dysuria, allograft tenderness, edema, fever, chills, nausea or vomiting. Her tremors and myoclonus are at baseline. She denies gross hematuria after the biopsy.     Transplant History:  Date of Transplant: 03/10/19  Organ Received: Left DDKT, DBD, SCD, KDPI 2%; cold ischemia 18 hrs.  Native Kidney Disease: C3 glomerulonephropathy  Pre-transplant CPRA:  0%  Post-Transplant Course: Complicated by anaphylactic reaction to Campath in OR  Induction: Campath  Date of Ureteral Stent Removal: 04/17/19  CMV/EBV Status: CMV D+/R+, EBV D+/R+  Rejection Episodes: None  Donor Specific Antibodies: Negative (most recent 04/18/19)   Biopsy 12/29/19: C3 glomerulopathy recurrence, no rejection    Results of Renal Imaging (pre and post):     Pre-Txp 01/18/17  Right interpolar cyst 1.4 x 1.3 x 1.1 cm (previously 0.9 x 0.9 x 0.9 cm), second, smaller cyst in the interpolar region measures 1.1 x 1.1 x 1.1 cm, left interpolar region cystic lesion with tiny peripheral echogenic focus measuring 0.7 x 0.8 x 0.7 cm. Multiple other small subcentimeter hypodensities are seen, too small to characterize. No hydronephrosis or renal calculi.     CT Renal Mass Protocol 02/25/2018  Bilateral atrophic kidneys with multiple simple cysts. No enhancing soft tissue renal mass. No hydronephrosis or calculi.    Post-Txp 03/10/19 (txp kidney only)  Normal size and echogenicity.  No solid masses or calculi. No perinephric collections identified. No hydronephrosis    Other Past Medical History  1. C3 Glomerulopathy  2. Seizure Disorder  3. 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 HTN  5. 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    Medications  Current Outpatient Medications   Medication Sig Dispense Refill   ??? acetaminophen (TYLENOL) 500 MG tablet Take 1-2 tablets (500-1,000 mg total) by mouth every six (6) hours as needed for pain or fever (> 38C). (Patient not taking: Reported on 12/29/2019) 100 tablet 0   ??? aspirin (ECOTRIN) 81 MG tablet Take 1 tablet (81 mg total) by mouth daily. 30 tablet 11   ??? cholecalciferol, vitamin D3-125 mcg, 5,000 unit,, 125 mcg (5,000 unit) capsule Take 1 capsule (125 mcg total) by mouth daily. 100 capsule 11   ??? clonazePAM (KLONOPIN) 0.5 MG tablet Take 1 tablet (0.5 mg total) by mouth two (2) times a day. 60 tablet 3   ??? clonazePAM (KLONOPIN) 0.5 MG tablet Take 1 tablet (0.5 mg total)  by mouth two (2) times a day. 60 tablet 0   ??? levETIRAcetam (KEPPRA) 250 MG tablet TAKE 1 TABLET BY MOUTH TWICE DAILY. 180 tablet 3   ??? magnesium oxide-Mg AA chelate (MAGNESIUM, AMINO ACID CHELATE,) 133 mg Tab Take 2 tablets by mouth Two (2) times a day. 120 tablet 11   ??? mycophenolate (MYFORTIC) 180 MG EC tablet Take 4 tablets (720 mg total) by mouth Two (2) times a day. Adjust dose per medication card. 240 tablet 11   ??? predniSONE (DELTASONE) 5 MG tablet Take 1 tablet (5 mg total) by mouth daily. 30 tablet 11   ??? sodium bicarbonate 650 mg tablet Take 1 tablet (650 mg total) by mouth Two (2) times a day. 60 tablet 11   ??? tacrolimus (ENVARSUS XR) 1 mg Tb24 extended release tablet Take ONE (1mg ) tablet with TWO (4mg ) tablets by mouth daily for a total daily dose of 9mg . 30 tablet 11   ??? tacrolimus (ENVARSUS XR) 4 mg Tb24 extended release tablet Take TWO (4mg ) tablets with ONE (1mg ) tablet by mouth daily for a total daily dose of 9mg . 90 tablet 11     No current facility-administered medications for this visit.     Physical Exam  BP 117/80 (BP Site: R Arm, BP Position: Sitting, BP Cuff Size: Medium)  - Pulse 96  - Temp 36.4 ??C (97.5 ??F) (Temporal)  - Ht 157.5 cm (5' 2)  - Wt 76.7 kg (169 lb)  - LMP 01/10/2020 (Approximate)  - BMI 30.91 kg/m??   General: no acute distress  HEENT: anicteric  Neck: neck supple, no adenopathy  CV: normal rate, normal rhythm, no murmur, no gallops, no rubs appreciated  Lungs: clear to auscultation bilaterally  Abdomen: soft, non tender, incision well healed.  Extremities:  no edema, pseudoaneurysm on AVF left upper arm.  Neurologic: Patient uses a walker for ambulation due to myoclonus.     Laboratory Results:  No results found for this or any previous visit (from the past 170 hour(s)).

## 2020-01-27 LAB — EBV QUANTITATIVE PCR, BLOOD: EBV VIRAL LOAD RESULT: NOT DETECTED

## 2020-01-27 LAB — TACROLIMUS LEVEL, TROUGH: TACROLIMUS, TROUGH: 9.7 ng/mL (ref 5.0–15.0)

## 2020-01-28 LAB — CMV DNA, QUANTITATIVE, PCR: CMV VIRAL LD: NOT DETECTED

## 2020-01-29 LAB — VITAMIN D 25 HYDROXY: VITAMIN D, TOTAL (25OH): 52.6 ng/mL (ref 20.0–80.0)

## 2020-01-31 ENCOUNTER — Ambulatory Visit
Admit: 2020-01-31 | Discharge: 2020-02-01 | Payer: PRIVATE HEALTH INSURANCE | Attending: Nurse Practitioner | Primary: Nurse Practitioner

## 2020-01-31 DIAGNOSIS — R87612 Low grade squamous intraepithelial lesion on cytologic smear of cervix (LGSIL): Principal | ICD-10-CM

## 2020-01-31 DIAGNOSIS — N870 Mild cervical dysplasia: Principal | ICD-10-CM

## 2020-01-31 LAB — CBC W/ DIFFERENTIAL
BASOPHILS ABSOLUTE COUNT: 0.1 10*3/uL (ref 0.0–0.2)
BASOPHILS RELATIVE PERCENT: 2 %
EOSINOPHILS ABSOLUTE COUNT: 0.1 10*3/uL (ref 0.0–0.4)
EOSINOPHILS RELATIVE PERCENT: 2 %
HEMATOCRIT: 32.7 % — ABNORMAL LOW (ref 34.0–46.6)
LYMPHOCYTES ABSOLUTE COUNT: 0.8 10*3/uL (ref 0.7–3.1)
MEAN CORPUSCULAR HEMOGLOBIN CONC: 31.5 g/dL (ref 31.5–35.7)
MEAN CORPUSCULAR HEMOGLOBIN: 29.3 pg (ref 26.6–33.0)
MEAN CORPUSCULAR VOLUME: 93 fL (ref 79–97)
MONOCYTES ABSOLUTE COUNT: 0.5 10*3/uL (ref 0.1–0.9)
NEUTROPHILS ABSOLUTE COUNT: 2.7 10*3/uL (ref 1.4–7.0)
NEUTROPHILS RELATIVE PERCENT: 64 %
PLATELET COUNT: 212 10*3/uL (ref 150–450)
RED BLOOD CELL COUNT: 3.52 x10E6/uL — ABNORMAL LOW (ref 3.77–5.28)
RED CELL DISTRIBUTION WIDTH: 13.1 % (ref 11.7–15.4)
WHITE BLOOD CELL COUNT: 4.2 10*3/uL (ref 3.4–10.8)

## 2020-01-31 LAB — WHITE BLOOD CELL COUNT: Leukocytes:NCnc:Pt:Bld:Qn:Automated count: 4.2

## 2020-01-31 LAB — RENAL FUNCTION PANEL
ALBUMIN: 4.2 g/dL (ref 3.9–5.0)
BLOOD UREA NITROGEN: 35 mg/dL — ABNORMAL HIGH (ref 6–20)
BUN / CREAT RATIO: 26 — ABNORMAL HIGH (ref 9–23)
CHLORIDE: 106 mmol/L (ref 96–106)
CO2: 22 mmol/L (ref 20–29)
CREATININE: 1.34 mg/dL — ABNORMAL HIGH (ref 0.57–1.00)
GFR MDRD AF AMER: 61 mL/min/{1.73_m2}
PHOSPHORUS, SERUM: 3.7 mg/dL (ref 3.0–4.3)
POTASSIUM: 4.9 mmol/L (ref 3.5–5.2)
SODIUM: 132 mmol/L — ABNORMAL LOW (ref 134–144)

## 2020-01-31 LAB — MAGNESIUM: Magnesium:MCnc:Pt:Ser/Plas:Qn:: 1.9

## 2020-01-31 LAB — GLUCOSE: Glucose:MCnc:Pt:Ser/Plas:Qn:: 95

## 2020-01-31 MED ORDER — PREDNISONE 5 MG TABLET
ORAL_TABLET | Freq: Every day | ORAL | 11 refills | 30.00000 days | Status: CP
Start: 2020-01-31 — End: ?
  Filled 2020-02-01: qty 30, 30d supply, fill #0

## 2020-01-31 MED ORDER — PREDNISONE 5 MG TABLET: 5 mg | tablet | Freq: Every day | 11 refills | 30 days | Status: AC

## 2020-01-31 NOTE — Unmapped (Signed)
Subjective:       Carolyn Kelley is a 29 y.o. Gr 0 here for repeat Pap smea.   In 11/2018 had abnorjjal pap and had colposcoy 12/21/18 at [facility] showind CIN 1 high grade not excluded. Pt denies any sexual contact since that time.  Pt has involved medical history with hx of kidney transplant.     The following portions of the patient's history were reviewed and updated as appropriate: allergies, current medications, past family history, past medical history, past social history, past surgical history and problem list.    Review of Systems  Pertinent items are noted in HPI.      Objective:     Blood pressure 122/87, pulse 90, height 157.5 cm (5' 2), weight 76.7 kg (169 lb), last menstrual period 01/10/2020.     General: Alert, Oriented,     Abdominal: soft, non-tender, No masses, no organomegaly    Genital:   External genitalia: Well estrogenized, no lesions  Vagina:Well-rugated normal vagina, no discharge, exudate, lesion, or erythema  Cervix:Nulliparous cervix without lesions or cervical motion tenderness.  Bimanual deferred       Assessment/Plan:     Problem List Items Addressed This Visit        Unprioritized    Abnormal Pap smear of cervix      Other Visit Diagnoses     Dysplasia of cervix, low grade (CIN 1)    -  Primary    Relevant Orders    Pap Smear        Pt does not need specific followup now    Thomasene Mohair MS CNM cPNP

## 2020-01-31 NOTE — Unmapped (Signed)
Pt request for RX Refill predniSONE (DELTASONE) 5 MG tablet

## 2020-01-31 NOTE — Unmapped (Signed)
[facility] Refill Coordination Note    Specialty Medication(s) to be Shipped:   Transplant: Envarsus 1mg , Envarsus 4mg , mycophenolate mofetil 180mg  and Prednisone 5mg     Other medication(s) to be shipped: magnesium and bactrim     Carolyn Kelley, DOB: 01/08/1991  Phone: [phone] (home)       All above HIPAA information was verified with patient.     Was a Nurse, learning disability used for this call? No    Completed refill call assessment today to schedule patient's medication shipment from the [facility] [phone]).       Specialty medication(s) and dose(s) confirmed: Regimen is correct and unchanged.   Changes to medications: Carolyn Kelley reports no changes at this time.  Changes to insurance: No  Questions for the pharmacist: No    Confirmed patient received Welcome Packet with first shipment. The patient will receive a drug information handout for each medication shipped and additional FDA Medication Guides as required.       DISEASE/MEDICATION-SPECIFIC INFORMATION        N/A    SPECIALTY MEDICATION ADHERENCE     Medication Adherence    Patient reported X missed doses in the last month: 0  Specialty Medication: Prednisone 5mg   Patient is on additional specialty medications: Yes  Additional Specialty Medications: Envarsus 1mg   Patient Reported Additional Medication X Missed Doses in the Last Month: 0  Patient is on more than two specialty medications: Yes  Specialty Medication: Envarsus 4mg   Patient Reported Additional Medication X Missed Doses in the Last Month: 0  Specialty Medication: Mycophenolate 180mg   Patient Reported Additional Medication X Missed Doses in the Last Month: 0       Prednisone 5 mg: 7 days of medicine on hand   Envarsus 1 mg: 14 days of medicine on hand   Envarsus 4 mg: 14 days of medicine on hand   Mycophenolate 180 mg: 9 days of medicine on hand     SHIPPING     Shipping address confirmed in Epic.     Delivery Scheduled: Yes, Expected medication delivery date: 02/08/2020.      **Bactrim and prednisone delivery on 02/02/2020**     Medication will be delivered via UPS to the prescription address in Epic [place].    Carolyn Kelley [facility] Specialty Technician

## 2020-01-31 NOTE — Unmapped (Signed)
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 Let her know that was OK. Will call if not getting better or needs anything else

## 2020-02-01 LAB — TACROLIMUS BLOOD: Tacrolimus:MCnc:Pt:Bld:Qn:LC/MS/MS: 10

## 2020-02-01 MED FILL — SODIUM BICARBONATE 650 MG TABLET: 30 days supply | Qty: 120 | Fill #0 | Status: AC

## 2020-02-01 MED FILL — PREDNISONE 5 MG TABLET: 30 days supply | Qty: 30 | Fill #0 | Status: AC

## 2020-02-02 LAB — FSAB CLASS 2 ANTIBODY SPECIFICITY: HLA CL2 AB RESULT: POSITIVE

## 2020-02-02 LAB — FSAB CLASS 1 ANTIBODY SPECIFICITY: HLA CLASS 1 ANTIBODY RESULT: POSITIVE

## 2020-02-02 LAB — CMV DNA, QUANTITATIVE, PCR

## 2020-02-02 LAB — HLA DS POST TRANSPLANT
ANTI-DONOR DRW #1 MFI: 170 MFI
ANTI-DONOR DRW #2 MFI: 170 MFI
ANTI-DONOR HLA-A #1 MFI: 47 MFI
ANTI-DONOR HLA-A #2 MFI: 47 MFI
ANTI-DONOR HLA-B #1 MFI: 187 MFI
ANTI-DONOR HLA-B #2 MFI: 92 MFI
ANTI-DONOR HLA-C #1 MFI: 282 MFI
ANTI-DONOR HLA-C #2 MFI: 757 MFI
ANTI-DONOR HLA-DP AG #1 MFI: 353 MFI
ANTI-DONOR HLA-DQB #1 MFI: 744 MFI
ANTI-DONOR HLA-DQB #2 MFI: 744 MFI
ANTI-DONOR HLA-DR #1 MFI: 147 MFI
ANTI-DONOR HLA-DR #2 MFI: 338 MFI

## 2020-02-02 LAB — VITAMIN D 1,25 DIHYDROXY: VITAMIN D 1,25-DIHYDROXY: 46 pg/mL

## 2020-02-02 LAB — LOG10 CMV QN DNA PL

## 2020-02-05 DIAGNOSIS — Z940 Kidney transplant status: Principal | ICD-10-CM

## 2020-02-07 LAB — CBC W/ DIFFERENTIAL
BASOPHILS ABSOLUTE COUNT: 0 10*3/uL (ref 0.0–0.2)
BASOPHILS RELATIVE PERCENT: 1 %
EOSINOPHILS RELATIVE PERCENT: 5 %
HEMATOCRIT: 32.3 % — ABNORMAL LOW (ref 34.0–46.6)
HEMOGLOBIN: 10.4 g/dL — ABNORMAL LOW (ref 11.1–15.9)
LYMPHOCYTES ABSOLUTE COUNT: 0.6 10*3/uL — ABNORMAL LOW (ref 0.7–3.1)
LYMPHOCYTES RELATIVE PERCENT: 15 %
MEAN CORPUSCULAR HEMOGLOBIN CONC: 32.2 g/dL (ref 31.5–35.7)
MEAN CORPUSCULAR HEMOGLOBIN: 29.7 pg (ref 26.6–33.0)
MEAN CORPUSCULAR VOLUME: 92 fL (ref 79–97)
MONOCYTES ABSOLUTE COUNT: 0.4 10*3/uL (ref 0.1–0.9)
MONOCYTES RELATIVE PERCENT: 9 %
NEUTROPHILS RELATIVE PERCENT: 70 %
PLATELET COUNT: 230 10*3/uL (ref 150–450)
RED BLOOD CELL COUNT: 3.5 x10E6/uL — ABNORMAL LOW (ref 3.77–5.28)
RED CELL DISTRIBUTION WIDTH: 13.2 % (ref 11.7–15.4)
WHITE BLOOD CELL COUNT: 4 10*3/uL (ref 3.4–10.8)

## 2020-02-07 LAB — RENAL FUNCTION PANEL
ALBUMIN: 4.2 g/dL (ref 3.9–5.0)
BLOOD UREA NITROGEN: 28 mg/dL — ABNORMAL HIGH (ref 6–20)
CALCIUM: 10.2 mg/dL (ref 8.7–10.2)
CHLORIDE: 107 mmol/L — ABNORMAL HIGH (ref 96–106)
CREATININE: 1.04 mg/dL — ABNORMAL HIGH (ref 0.57–1.00)
GFR MDRD AF AMER: 83 mL/min/{1.73_m2}
GFR MDRD NON AF AMER: 72 mL/min/{1.73_m2}
GLUCOSE: 97 mg/dL (ref 65–99)
PHOSPHORUS, SERUM: 3.9 mg/dL (ref 3.0–4.3)
POTASSIUM: 5.1 mmol/L (ref 3.5–5.2)
SODIUM: 140 mmol/L (ref 134–144)

## 2020-02-07 LAB — RED CELL DISTRIBUTION WIDTH: Erythrocyte distribution width:Ratio:Pt:RBC:Qn:Automated count: 13.2

## 2020-02-07 LAB — MAGNESIUM: Magnesium:MCnc:Pt:Ser/Plas:Qn:: 1.8

## 2020-02-07 LAB — CREATININE: Creatinine:MCnc:Pt:Ser/Plas:Qn:: 1.04 — ABNORMAL HIGH

## 2020-02-07 MED FILL — ENVARSUS XR 4 MG TABLET,EXTENDED RELEASE: 30 days supply | Qty: 60 | Fill #5

## 2020-02-07 MED FILL — ENVARSUS XR 4 MG TABLET,EXTENDED RELEASE: 30 days supply | Qty: 60 | Fill #5 | Status: AC

## 2020-02-07 MED FILL — MG-PLUS-PROTEIN 133 MG TABLET: 30 days supply | Qty: 120 | Fill #10 | Status: AC

## 2020-02-07 MED FILL — ENVARSUS XR 1 MG TABLET,EXTENDED RELEASE: 30 days supply | Qty: 30 | Fill #5

## 2020-02-07 MED FILL — MG-PLUS-PROTEIN 133 MG TABLET: ORAL | 30 days supply | Qty: 120 | Fill #10

## 2020-02-07 MED FILL — MYCOPHENOLATE SODIUM 180 MG TABLET,DELAYED RELEASE: 30 days supply | Qty: 240 | Fill #1 | Status: AC

## 2020-02-07 MED FILL — ENVARSUS XR 1 MG TABLET,EXTENDED RELEASE: 30 days supply | Qty: 30 | Fill #5 | Status: AC

## 2020-02-07 MED FILL — MYCOPHENOLATE SODIUM 180 MG TABLET,DELAYED RELEASE: ORAL | 30 days supply | Qty: 240 | Fill #1

## 2020-02-09 LAB — CMV DNA, QUANTITATIVE, PCR: CMV QUANT: NEGATIVE [IU]/mL

## 2020-02-09 LAB — TACROLIMUS LEVEL: TACROLIMUS BLOOD: 6.9 ng/mL (ref 2.0–20.0)

## 2020-02-09 LAB — TACROLIMUS BLOOD: Tacrolimus:MCnc:Pt:Bld:Qn:LC/MS/MS: 6.9

## 2020-02-14 LAB — RENAL FUNCTION PANEL
BLOOD UREA NITROGEN: 25 mg/dL — ABNORMAL HIGH (ref 6–20)
BUN / CREAT RATIO: 21 (ref 9–23)
CALCIUM: 10.1 mg/dL (ref 8.7–10.2)
CHLORIDE: 103 mmol/L (ref 96–106)
CREATININE: 1.21 mg/dL — ABNORMAL HIGH (ref 0.57–1.00)
GFR MDRD AF AMER: 69 mL/min/{1.73_m2}
GFR MDRD NON AF AMER: 60 mL/min/{1.73_m2}
GLUCOSE: 92 mg/dL (ref 65–99)
PHOSPHORUS, SERUM: 3.7 mg/dL (ref 3.0–4.3)
POTASSIUM: 4.9 mmol/L (ref 3.5–5.2)
SODIUM: 137 mmol/L (ref 134–144)

## 2020-02-14 LAB — CBC W/ DIFFERENTIAL
BASOPHILS ABSOLUTE COUNT: 0 10*3/uL (ref 0.0–0.2)
BASOPHILS RELATIVE PERCENT: 0 %
EOSINOPHILS ABSOLUTE COUNT: 0 10*3/uL (ref 0.0–0.4)
EOSINOPHILS RELATIVE PERCENT: 1 %
HEMATOCRIT: 33.1 % — ABNORMAL LOW (ref 34.0–46.6)
HEMOGLOBIN: 10.7 g/dL — ABNORMAL LOW (ref 11.1–15.9)
LYMPHOCYTES ABSOLUTE COUNT: 0.8 10*3/uL (ref 0.7–3.1)
MEAN CORPUSCULAR HEMOGLOBIN CONC: 32.3 g/dL (ref 31.5–35.7)
MEAN CORPUSCULAR VOLUME: 92 fL (ref 79–97)
MONOCYTES ABSOLUTE COUNT: 0.2 10*3/uL (ref 0.1–0.9)
MONOCYTES RELATIVE PERCENT: 5 %
NEUTROPHILS ABSOLUTE COUNT: 3.1 10*3/uL (ref 1.4–7.0)
NEUTROPHILS RELATIVE PERCENT: 74 %
PLATELET COUNT: 241 10*3/uL (ref 150–450)
RED BLOOD CELL COUNT: 3.61 x10E6/uL — ABNORMAL LOW (ref 3.77–5.28)
RED CELL DISTRIBUTION WIDTH: 13.1 % (ref 11.7–15.4)
WHITE BLOOD CELL COUNT: 4.2 10*3/uL (ref 3.4–10.8)

## 2020-02-14 LAB — MAGNESIUM
MAGNESIUM: 1.8 mg/dL (ref 1.6–2.3)
Magnesium:MCnc:Pt:Ser/Plas:Qn:: 1.8

## 2020-02-14 LAB — MEAN CORPUSCULAR HEMOGLOBIN CONC: Erythrocyte mean corpuscular hemoglobin concentration:MCnc:Pt:RBC:Qn:Automated count: 32.3

## 2020-02-14 LAB — PHOSPHORUS, SERUM: Phosphate:MCnc:Pt:Ser/Plas:Qn:: 3.7

## 2020-02-16 LAB — TACROLIMUS BLOOD: Tacrolimus:MCnc:Pt:Bld:Qn:LC/MS/MS: 9.5

## 2020-02-19 DIAGNOSIS — Z940 Kidney transplant status: Principal | ICD-10-CM

## 2020-02-21 LAB — CBC W/ DIFFERENTIAL
BASOPHILS ABSOLUTE COUNT: 0 10*3/uL (ref 0.0–0.2)
EOSINOPHILS ABSOLUTE COUNT: 0.1 10*3/uL (ref 0.0–0.4)
EOSINOPHILS RELATIVE PERCENT: 3 %
HEMATOCRIT: 34.2 % (ref 34.0–46.6)
HEMOGLOBIN: 11.2 g/dL (ref 11.1–15.9)
LYMPHOCYTES ABSOLUTE COUNT: 0.6 10*3/uL — ABNORMAL LOW (ref 0.7–3.1)
MEAN CORPUSCULAR HEMOGLOBIN CONC: 32.7 g/dL (ref 31.5–35.7)
MEAN CORPUSCULAR HEMOGLOBIN: 30 pg (ref 26.6–33.0)
MEAN CORPUSCULAR VOLUME: 92 fL (ref 79–97)
MONOCYTES ABSOLUTE COUNT: 0.2 10*3/uL (ref 0.1–0.9)
MONOCYTES RELATIVE PERCENT: 5 %
NEUTROPHILS ABSOLUTE COUNT: 3.2 10*3/uL (ref 1.4–7.0)
NEUTROPHILS RELATIVE PERCENT: 77 %
PLATELET COUNT: 246 10*3/uL (ref 150–450)
RED BLOOD CELL COUNT: 3.73 x10E6/uL — ABNORMAL LOW (ref 3.77–5.28)
RED CELL DISTRIBUTION WIDTH: 13.3 % (ref 11.7–15.4)
WHITE BLOOD CELL COUNT: 4.1 10*3/uL (ref 3.4–10.8)

## 2020-02-21 LAB — RENAL FUNCTION PANEL
ALBUMIN: 4.5 g/dL (ref 3.9–5.0)
BLOOD UREA NITROGEN: 31 mg/dL — ABNORMAL HIGH (ref 6–20)
BUN / CREAT RATIO: 28 — ABNORMAL HIGH (ref 9–23)
CALCIUM: 10 mg/dL (ref 8.7–10.2)
CHLORIDE: 105 mmol/L (ref 96–106)
CO2: 20 mmol/L (ref 20–29)
CREATININE: 1.12 mg/dL — ABNORMAL HIGH (ref 0.57–1.00)
GLUCOSE: 91 mg/dL (ref 65–99)
PHOSPHORUS, SERUM: 4.1 mg/dL (ref 3.0–4.3)
POTASSIUM: 4.8 mmol/L (ref 3.5–5.2)

## 2020-02-21 LAB — MAGNESIUM: Magnesium:MCnc:Pt:Ser/Plas:Qn:: 1.9

## 2020-02-21 LAB — CREATININE: Creatinine:MCnc:Pt:Ser/Plas:Qn:: 1.12 — ABNORMAL HIGH

## 2020-02-21 LAB — PLATELET COUNT: Platelets:NCnc:Pt:Bld:Qn:Automated count: 246

## 2020-02-22 LAB — CMV DNA, QUANTITATIVE, PCR: CMV QUANT: POSITIVE [IU]/mL

## 2020-02-23 LAB — TACROLIMUS BLOOD: Tacrolimus:MCnc:Pt:Bld:Qn:LC/MS/MS: 7.7

## 2020-02-23 NOTE — Unmapped (Signed)
[facility] Refill Coordination Note    Specialty Medication(s) to be Shipped:   Transplant: Prednisone 5mg     Other medication(s) to be shipped: Sodium Bicarbonate 650mg      Audelia Hives, DOB: Dec 16, 1990  Phone: [phone] (home)       All above HIPAA information was verified with patient.     Was a Nurse, learning disability used for this call? No    Completed refill call assessment today to schedule patient's medication shipment from the [facility] [phone]).       Specialty medication(s) and dose(s) confirmed: Regimen is correct and unchanged.   Changes to medications: Kynsli reports no changes at this time.  Changes to insurance: No  Questions for the pharmacist: No    Confirmed patient received Welcome Packet with first shipment. The patient will receive a drug information handout for each medication shipped and additional FDA Medication Guides as required.       DISEASE/MEDICATION-SPECIFIC INFORMATION        N/A    SPECIALTY MEDICATION ADHERENCE     Medication Adherence    Patient reported X missed doses in the last month: 0  Specialty Medication: Prednisone 5mg   Patient is on additional specialty medications: No  Informant: patient                Prednisone 5 mg: 9 days of medicine on hand          SHIPPING     Shipping address confirmed in Epic.     Delivery Scheduled: Yes, Expected medication delivery date: 02/28/20.     Medication will be delivered via UPS to the prescription address in Epic [place].    Wyatt Mage M Elisabeth Cara   [facility] Specialty Technician

## 2020-02-27 MED FILL — PREDNISONE 5 MG TABLET: 30 days supply | Qty: 30 | Fill #1 | Status: AC

## 2020-02-27 MED FILL — SODIUM BICARBONATE 650 MG TABLET: ORAL | 30 days supply | Qty: 120 | Fill #1

## 2020-02-27 MED FILL — SODIUM BICARBONATE 650 MG TABLET: 30 days supply | Qty: 120 | Fill #1 | Status: AC

## 2020-02-27 MED FILL — PREDNISONE 5 MG TABLET: ORAL | 30 days supply | Qty: 30 | Fill #1

## 2020-02-27 NOTE — Unmapped (Signed)
Patient called to see if she should get labs while in clinic.  Instructed her to get labs while she was here.    Confirmed clinic time

## 2020-02-29 MED ORDER — CLONAZEPAM 0.5 MG TABLET
ORAL_TABLET | 3 refills | 0.00000 days | Status: CN
Start: 2020-02-29 — End: ?

## 2020-02-29 NOTE — Unmapped (Signed)
[facility] Refill Coordination Note    Specialty Medication(s) to be Shipped:   Transplant: Envarsus 1mg , Envarsus 4mg  and mycophenolate mofetil 180mg     Other medication(s) to be shipped: magnesium 133mg      Carolyn Kelley, DOB: 04-May-1990  Phone: [phone] (home)       All above HIPAA information was verified with patient.     Was a Nurse, learning disability used for this call? No    Completed refill call assessment today to schedule patient's medication shipment from the [facility] [phone]).       Specialty medication(s) and dose(s) confirmed: Regimen is correct and unchanged.   Changes to medications: [name] reports no changes at this time.  Changes to insurance: No  Questions for the pharmacist: No    Confirmed patient received Welcome Packet with first shipment. The patient will receive a drug information handout for each medication shipped and additional FDA Medication Guides as required.       DISEASE/MEDICATION-SPECIFIC INFORMATION        N/A    SPECIALTY MEDICATION ADHERENCE     Medication Adherence    Patient reported X missed doses in the last month: 0  Specialty Medication: envarsus 1mg   Patient is on additional specialty medications: Yes  Additional Specialty Medications: envarsus 4mg   Patient Reported Additional Medication X Missed Doses in the Last Month: 0  Patient is on more than two specialty medications: Yes  Specialty Medication: Mycophenolate 180mg   Patient Reported Additional Medication X Missed Doses in the Last Month: 0  Any gaps in refill history greater than 2 weeks in the last 3 months: no  Demonstrates understanding of importance of adherence: yes  Informant: patient  Reliability of informant: reliable  Provider-estimated medication adherence level: good  Patient is at risk for Non-Adherence: No                envarsus 1 mg: 8 days of medicine on hand   envarsus 4 mg: 8 days of medicine on hand   mycophenolate 180 mg: 8 days of medicine on hand SHIPPING     Shipping address confirmed in Epic.     Delivery Scheduled: Yes, Expected medication delivery date: 12/14.     Medication will be delivered via UPS to the prescription address in Epic WAM.    Antonietta Barcelona   [facility] Specialty Technician

## 2020-03-01 ENCOUNTER — Ambulatory Visit
Admit: 2020-03-01 | Discharge: 2020-03-01 | Payer: PRIVATE HEALTH INSURANCE | Attending: Nephrology | Primary: Nephrology

## 2020-03-01 ENCOUNTER — Ambulatory Visit: Admit: 2020-03-01 | Discharge: 2020-03-01 | Payer: PRIVATE HEALTH INSURANCE

## 2020-03-01 ENCOUNTER — Institutional Professional Consult (permissible substitution): Admit: 2020-03-01 | Discharge: 2020-03-01 | Payer: PRIVATE HEALTH INSURANCE

## 2020-03-01 DIAGNOSIS — Z23 Encounter for immunization: Principal | ICD-10-CM

## 2020-03-01 DIAGNOSIS — D849 Immunodeficiency, unspecified: Principal | ICD-10-CM

## 2020-03-01 DIAGNOSIS — Z940 Kidney transplant status: Principal | ICD-10-CM

## 2020-03-01 DIAGNOSIS — Z79899 Other long term (current) drug therapy: Principal | ICD-10-CM

## 2020-03-01 LAB — CBC W/ AUTO DIFF
BASOPHILS ABSOLUTE COUNT: 0 10*9/L (ref 0.0–0.1)
BASOPHILS RELATIVE PERCENT: 0 %
EOSINOPHILS ABSOLUTE COUNT: 0.1 10*9/L (ref 0.0–0.7)
EOSINOPHILS RELATIVE PERCENT: 2.6 %
HEMATOCRIT: 34.4 % — ABNORMAL LOW (ref 35.0–44.0)
HEMOGLOBIN: 11.2 g/dL — ABNORMAL LOW (ref 12.0–15.5)
LYMPHOCYTES ABSOLUTE COUNT: 0.9 10*9/L (ref 0.7–4.0)
LYMPHOCYTES RELATIVE PERCENT: 20.3 %
MEAN CORPUSCULAR HEMOGLOBIN CONC: 32.6 g/dL (ref 30.0–36.0)
MEAN CORPUSCULAR HEMOGLOBIN: 29.8 pg (ref 26.0–34.0)
MEAN CORPUSCULAR VOLUME: 91.5 fL (ref 82.0–98.0)
MEAN PLATELET VOLUME: 7.9 fL (ref 7.0–10.0)
MONOCYTES ABSOLUTE COUNT: 0.5 10*9/L (ref 0.1–1.0)
MONOCYTES RELATIVE PERCENT: 10.4 %
NEUTROPHILS ABSOLUTE COUNT: 3.1 10*9/L (ref 1.7–7.7)
NEUTROPHILS RELATIVE PERCENT: 66.7 %
NUCLEATED RED BLOOD CELLS: 0 /100{WBCs} (ref <=4)
PLATELET COUNT: 243 10*9/L (ref 150–450)
RED BLOOD CELL COUNT: 3.76 10*12/L — ABNORMAL LOW (ref 3.90–5.03)
RED CELL DISTRIBUTION WIDTH: 14.4 % (ref 12.0–15.0)
WBC ADJUSTED: 4.6 10*9/L (ref 3.5–10.5)

## 2020-03-01 LAB — SLIDE REVIEW

## 2020-03-01 LAB — BASIC METABOLIC PANEL
ANION GAP: 6 mmol/L (ref 5–14)
BLOOD UREA NITROGEN: 33 mg/dL — ABNORMAL HIGH (ref 9–23)
BUN / CREAT RATIO: 31
CALCIUM: 10.6 mg/dL — ABNORMAL HIGH (ref 8.7–10.4)
CHLORIDE: 108 mmol/L — ABNORMAL HIGH (ref 98–107)
CO2: 25.8 mmol/L (ref 20.0–31.0)
CREATININE: 1.07 mg/dL — ABNORMAL HIGH
EGFR CKD-EPI AA FEMALE: 80 mL/min/{1.73_m2} (ref >=60)
EGFR CKD-EPI NON-AA FEMALE: 70 mL/min/{1.73_m2} (ref >=60)
GLUCOSE RANDOM: 89 mg/dL (ref 70–99)
POTASSIUM: 4.3 mmol/L (ref 3.4–4.5)
SODIUM: 140 mmol/L (ref 135–145)

## 2020-03-01 LAB — REFERRAL LABORATORY TEST, OTHER

## 2020-03-01 LAB — TACROLIMUS LEVEL, TROUGH: TACROLIMUS, TROUGH: 8.2 ng/mL (ref 5.0–15.0)

## 2020-03-01 LAB — URINALYSIS
BILIRUBIN UA: NEGATIVE
BLOOD UA: NEGATIVE
GLUCOSE UA: NEGATIVE
KETONES UA: NEGATIVE
LEUKOCYTE ESTERASE UA: NEGATIVE
NITRITE UA: NEGATIVE
PH UA: 6 (ref 5.0–9.0)
PROTEIN UA: 100 — AB
RBC UA: <1 /HPF (ref <4)
SPECIFIC GRAVITY UA: >=1.03 (ref 1.005–1.030)
SQUAMOUS EPITHELIAL: 19 /HPF — ABNORMAL HIGH (ref 0–5)
UROBILINOGEN UA: 0.2
WBC UA: <1 /HPF (ref 0–5)

## 2020-03-01 LAB — PROTEIN / CREATININE RATIO, URINE
CREATININE, URINE: 130.3 mg/dL
PROTEIN URINE: 97.4 mg/dL
PROTEIN/CREAT RATIO, URINE: 0.748

## 2020-03-01 LAB — PHOSPHORUS: PHOSPHORUS: 4.1 mg/dL (ref 2.4–5.1)

## 2020-03-01 LAB — MAGNESIUM: MAGNESIUM: 1.8 mg/dL (ref 1.6–2.6)

## 2020-03-01 NOTE — Unmapped (Signed)
Saw patient in clinic with mom. Reports she is doing well, just finished school for the semester.    Denies HA,dizziness, CP, heart arrythmia, SOB, abdominal pain, n/v, urinary problems, swelling, or numbness/tingling    +Diarrhea for a week after last apt. Now having normal BM    +tremors    +not feeling rested after sleeping sometimes, feeling tired    4 16.9 oz bottles of water a day    Appetite is good    Labs today, took envarsus at 11am yesterday

## 2020-03-01 NOTE — Unmapped (Signed)
Meninginitis B-vaccine

## 2020-03-01 NOTE — Unmapped (Signed)
One bp taken.

## 2020-03-01 NOTE — Unmapped (Addendum)
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 C3, C4, and functional and genetic panels are pending still. We have previously increased her dose of Myfortic to 720 mg bid and started prednisone 5 mg daily. Serum creatinine is stable at 1.07 mg/dL and UPC has normalized today (0.748). If proteinuria worsens or creatinine increases will consider eculizumab. She has no history of rejection or DSAs (last DSA screen 01/26/20).     Immunosuppression Management [High Risk Medical Decision Making For Drug Therapy Requiring Intensive Monitoring For Toxicity]:   - Tacrolimus level 8.2 (target 6-10 ng/mL).   - Envarsus 9 mg daily dose to be continued   - Higher dose Myfortic 720 mg bid to be continued due to C3 glomerulopathy recurrence  - Prednisone 5 mg daily started 01/05/20 due to C3 glomerulopathy recurrence, previously steroid free    Infectious Prophylaxis and Monitoring: CMV & EBV: D+/R+  - PJP prophylaxis: completed (s/p Bactrim x 6 months)  - CMV prophylaxis: completed (s/p Valcyte x3 months).   - COVID-19 vaccination: completed including 3rd dose 11/29/19. She is aware of the limited efficacy of this vaccine in transplant recipients.  - Received Men B on 01/26/20 and 03/01/20  - Received MenACWY 01/26/20, with second dose to be given at next visit.   - Will then get booster doses every 1-5 years per CDC recommendations while on eculizumab therapy if it is initiated.    Seizures Disorder, no seizures since 2014:    Will continue Keppra.  She will follow up with Carolyn Ege, NP at [facility].  Last visit on 05/24/19, with yearly follow ups.     Myoclonus:   Will continue Klonopin, prescribed by PCP.  Saw [facility] physical therapy.  Continues to use walker.     Metabolic Acidosis and Hyperkalemia  Sodium bicarbonate 1300 mg bid.  Most recent serum CO2 25.8     Pseudoaneurysm of AVF  She saw Dr. Norma Fredrickson 09/21/19 with plan to defer any intervention until 1 year has passed from transplant. She has no high output heart failure by echo or symptoms of CHF. We will coordinate with Dr. Norma Fredrickson. Favor a nonurgent revision to minimize any risk of rupture, but spare the AVF for future use if possible to do so safely. Patient advised to contact us with any scabbing or erosion of the skin over the pseudoaneurysm.    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 Leep scheduled for 03/06/20.     Immunizations:   Immunization History   Administered Date(s) Administered   ??? COVID-19 VACC,MRNA,(PFIZER)(PF)(IM) 07/06/2019, 07/31/2019, 11/29/2019   ??? Hepatitis B, Adult 01/21/2012, 01/21/2012, 02/16/2012, 02/16/2012, 03/17/2012, 03/17/2012, 07/14/2012, 07/14/2012   ??? HiB, unspecified 04/29/2011   ??? INFLUENZA TIV (TRI) 361MO+ W/ PRESERV (IM) 03/31/2011   ??? Influenza Vaccine Quad (IIV4 PF) 65mo+ injectable 12/15/2018, 12/15/2018, 12/15/2018, 11/29/2019   ??? Influenza Vaccine Quad (IIV4 W/PRESERV) 361MO+ 12/16/2017   ??? Meningococcal B Vaccine, OMV Adjuvanted 01/26/2020   ??? Meningococcal C Conjugate 04/29/2011   ??? Meningococcal conjugate vaccine, serogroups A, C, Y and W-135 (tetravalent), for intramuscular use 01/26/2020   ???  PNEUMOCOCCAL POLYSACCHARIDE 23 02/04/2012, 02/04/2012, 01/26/2020   ??? PPD Test 05/01/2011   ??? Pneumococcal Conjugate 13-Valent 02/23/2019, 02/23/2019     Childbearing age  Discussed the following: Most pre-menopausal women experience return of fertility post-transplant, and a safe pregnancy is possible with careful planning. Recommend using abstinence or a highly effective contraception to avoid pregnancy until planning pregnancy, and if planning pregnancy please notify nephrology so that we can adjust medications to avoid teratogens.    Follow up  4 weeks with annual imaging    History of Present Illness    She is a 29 y.o. 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 Her post-operative course was complicated by hypotension and hypoxia thought to be caused by anaphylactic reaction to Campath. She has experienced no episodes of rejection or infectious complications. Due to presence of proteinuria she underwent kidney biopsy on 12/29/19. The biopsy showed C3 glomerulopathy recurrence. In response to this diagnosis Myfortic was increased from 540 mg bid to 720 mg bid and she was started on prednisone 5 mg daily.     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  She is scheduled for a leep procedure on 03/06/20.     She is here today for follow up. She presents today doing well.  She is no longer having diarrhea or constipation. She continues to have tremors, but they are manageable and have not changed. She denies headaches, dizziness, chest pain, shortness of breath, fever, chills, abdominal pain, n/v/d, edema, dysuria, hematuria, pain/burning with urination, and odor.     Last dose of Prograf: 11 AM yesterday.     Transplant History:  Date of Transplant: 03/10/19  Organ Received: Left DDKT, DBD, SCD, KDPI 2%; cold ischemia 18 hrs.  Native Kidney Disease: C3 glomerulonephropathy  Pre-transplant CPRA:  0%  Post-Transplant Course: Complicated by anaphylactic reaction to Campath in OR  Induction: Campath  Date of Ureteral Stent Removal: 04/17/19  CMV/EBV Status: CMV D+/R+, EBV D+/R+  Rejection Episodes: None  Donor Specific Antibodies: Negative (most recent 01/26/20)   Biopsy 12/29/19: C3 glomerulopathy recurrence, no rejection    Results of Renal Imaging (pre and post):     Pre-Txp 01/18/17  Right interpolar cyst 1.4 x 1.3 x 1.1 cm (previously 0.9 x 0.9 x 0.9 cm), second, smaller cyst in the interpolar region measures 1.1 x 1.1 x 1.1 cm, left interpolar region cystic lesion with tiny peripheral echogenic focus measuring 0.7 x 0.8 x 0.7 cm. Multiple other small subcentimeter hypodensities are seen, too small to characterize. No hydronephrosis or renal calculi.     CT Renal Mass Protocol 02/25/2018  Bilateral atrophic kidneys with multiple simple cysts. No enhancing soft tissue renal mass. No hydronephrosis or calculi.    Post-Txp 03/10/19 (txp kidney only)  Normal size and echogenicity.  No solid masses or calculi. No perinephric collections identified. No hydronephrosis    Other Past Medical History  1. C3 Glomerulopathy  2. Seizure Disorder  3. 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 HTN  5. 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    Medications  Current Outpatient Medications   Medication Sig Dispense Refill   ??? acetaminophen (TYLENOL) 500 MG tablet Take 1-2 tablets (500-1,000 mg total) by mouth every  six (6) hours as needed for pain or fever (> 38C). 100 tablet 0   ??? aspirin (ECOTRIN) 81 MG tablet Take 1 tablet (81 mg total) by mouth daily. 30 tablet 11   ??? cholecalciferol, vitamin D3-125 mcg, 5,000 unit,, 125 mcg (5,000 unit) capsule Take 1 capsule (125 mcg total) by mouth daily. 100 capsule 11   ??? clonazePAM (KLONOPIN) 0.5 MG tablet Take 1 tablet (0.5 mg total) by mouth two (2) times a day. 60 tablet 3   ??? levETIRAcetam (KEPPRA) 250 MG tablet TAKE 1 TABLET BY MOUTH TWICE DAILY. 180 tablet 3   ??? magnesium oxide-Mg AA chelate (MAGNESIUM, AMINO ACID CHELATE,) 133 mg Tab Take 2 tablets by mouth Two (2) times a day. 120 tablet 11   ??? mycophenolate (MYFORTIC) 180 MG EC tablet Take 4 tablets (720 mg total) by mouth Two (2) times a day. Adjust dose per medication card. 240 tablet 11 ??? predniSONE (DELTASONE) 5 MG tablet Take 1 tablet (5 mg total) by mouth daily. 30 tablet 11   ??? sodium bicarbonate 650 mg tablet Take 2 tablets (1,300 mg total) by mouth Two (2) times a day. 120 tablet 11   ??? tacrolimus (ENVARSUS XR) 1 mg Tb24 extended release tablet Take ONE (1mg ) tablet with TWO (4mg ) tablets by mouth daily for a total daily dose of 9mg . 30 tablet 11   ??? tacrolimus (ENVARSUS XR) 4 mg Tb24 extended release tablet Take TWO (4mg ) tablets with ONE (1mg ) tablet by mouth daily for a total daily dose of 9mg . 90 tablet 11     No current facility-administered medications for this visit.   PDMP reviewed today. Last Clonazepam 0.5 mg fill on 02/19/20.     Physical Exam  BP 117/86 (BP Site: R Arm, BP Position: Sitting, BP Cuff Size: Medium)  - Pulse 93  - Temp 36 ??C (96.8 ??F) (Temporal)  - Ht 157.5 cm (5' 2)  - Wt 76.7 kg (169 lb)  - LMP  (LMP Unknown)  - BMI 30.91 kg/m??    General: Patient is a pleasant female in no apparent distress.  Eyes: Sclera anicteric.  Neck: Supple without LAD/JVD/bruits.  Lungs: Clear to auscultation bilaterally, no wheezes/rales/rhonchi.  Cardiovascular: Regular rate and rhythm without murmurs, rubs or gallops.  Abdomen: Soft, notender/nondistended. Positive bowel sounds. No hepatosplenomegaly, masses or bruits appreciated.  Extremities: Without edema, joints without evidence of synovitis; pseudoaneurysm of AVF LUE  Skin: Without rash  Neurological: Grossly nonfocal; uses a walker for ambulation due to myoclonus  Psychiatric: Mood and affect appropriate.      Laboratory Results:  Recent Results (from the past 170 hour(s))   Magnesium Level    Collection Time: 03/01/20  8:52 AM   Result Value Ref Range    Magnesium 1.8 1.6 - 2.6 mg/dL   Phosphorus Level    Collection Time: 03/01/20  8:52 AM   Result Value Ref Range    Phosphorus 4.1 2.4 - 5.1 mg/dL   Basic Metabolic Panel    Collection Time: 03/01/20  8:52 AM   Result Value Ref Range    Sodium 140 135 - 145 mmol/L    Potassium 4.3 3.4 - 4.5 mmol/L    Chloride 108 (H) 98 - 107 mmol/L    CO2 25.8 20.0 - 31.0 mmol/L    Anion Gap 6 5 - 14 mmol/L    BUN 33 (H) 9 - 23 mg/dL    Creatinine 1.61 (H) 0.60 - 0.80 mg/dL    BUN/Creatinine Ratio 31  EGFR CKD-EPI Non-African American, Female 70 >=60 mL/min/1.59m2    EGFR CKD-EPI African American, Female 80 >=60 mL/min/1.58m2    Glucose 89 70 - 99 mg/dL    Calcium 21.3 (H) 8.7 - 10.4 mg/dL   CBC w/ Differential    Collection Time: 03/01/20  8:52 AM   Result Value Ref Range    WBC 4.6 3.5 - 10.5 10*9/L    RBC 3.76 (L) 3.90 - 5.03 10*12/L    HGB 11.2 (L) 12.0 - 15.5 g/dL    HCT 08.6 (L) 57.8 - 44.0 %    MCV 91.5 82.0 - 98.0 fL    MCH 29.8 26.0 - 34.0 pg    MCHC 32.6 30.0 - 36.0 g/dL    RDW 46.9 62.9 - 52.8 %    MPV 7.9 7.0 - 10.0 fL    Platelet 243 150 - 450 10*9/L    nRBC 0 <=4 /100 WBCs    Neutrophils % 66.7 %    Lymphocytes % 20.3 %    Monocytes % 10.4 %    Eosinophils % 2.6 %    Basophils % 0.0 %    Absolute Neutrophils 3.1 1.7 - 7.7 10*9/L    Absolute Lymphocytes 0.9 0.7 - 4.0 10*9/L    Absolute Monocytes 0.5 0.1 - 1.0 10*9/L    Absolute Eosinophils 0.1 0.0 - 0.7 10*9/L    Absolute Basophils 0.0 0.0 - 0.1 10*9/L

## 2020-03-04 DIAGNOSIS — Z940 Kidney transplant status: Principal | ICD-10-CM

## 2020-03-04 MED FILL — MG-PLUS-PROTEIN 133 MG TABLET: ORAL | 30 days supply | Qty: 120 | Fill #11

## 2020-03-04 MED FILL — ENVARSUS XR 4 MG TABLET,EXTENDED RELEASE: 30 days supply | Qty: 60 | Fill #6

## 2020-03-04 MED FILL — MYCOPHENOLATE SODIUM 180 MG TABLET,DELAYED RELEASE: 30 days supply | Qty: 240 | Fill #2 | Status: AC

## 2020-03-04 MED FILL — ENVARSUS XR 1 MG TABLET,EXTENDED RELEASE: 30 days supply | Qty: 30 | Fill #6

## 2020-03-04 MED FILL — MYCOPHENOLATE SODIUM 180 MG TABLET,DELAYED RELEASE: ORAL | 30 days supply | Qty: 240 | Fill #2

## 2020-03-04 MED FILL — MG-PLUS-PROTEIN 133 MG TABLET: 30 days supply | Qty: 120 | Fill #11 | Status: AC

## 2020-03-04 MED FILL — ENVARSUS XR 1 MG TABLET,EXTENDED RELEASE: 30 days supply | Qty: 30 | Fill #6 | Status: AC

## 2020-03-04 MED FILL — ENVARSUS XR 4 MG TABLET,EXTENDED RELEASE: 30 days supply | Qty: 60 | Fill #6 | Status: AC

## 2020-03-06 ENCOUNTER — Ambulatory Visit
Admit: 2020-03-06 | Discharge: 2020-03-07 | Payer: PRIVATE HEALTH INSURANCE | Attending: Student in an Organized Health Care Education/Training Program | Primary: Student in an Organized Health Care Education/Training Program

## 2020-03-06 DIAGNOSIS — G43909 Migraine, unspecified, not intractable, without status migrainosus: Principal | ICD-10-CM

## 2020-03-06 DIAGNOSIS — F419 Anxiety disorder, unspecified: Principal | ICD-10-CM

## 2020-03-06 DIAGNOSIS — R87619 Unspecified abnormal cytological findings in specimens from cervix uteri: Principal | ICD-10-CM

## 2020-03-06 DIAGNOSIS — Z720 Tobacco use: Principal | ICD-10-CM

## 2020-03-06 LAB — RENAL FUNCTION PANEL
ALBUMIN: 4.3 g/dL (ref 3.9–5.0)
BLOOD UREA NITROGEN: 23 mg/dL — ABNORMAL HIGH (ref 6–20)
BUN / CREAT RATIO: 21 (ref 9–23)
CALCIUM: 10.1 mg/dL (ref 8.7–10.2)
CHLORIDE: 104 mmol/L (ref 96–106)
CO2: 20 mmol/L (ref 20–29)
CREATININE: 1.1 mg/dL — ABNORMAL HIGH (ref 0.57–1.00)
GFR MDRD AF AMER: 78 mL/min/{1.73_m2}
GFR MDRD NON AF AMER: 68 mL/min/{1.73_m2}
GLUCOSE: 96 mg/dL (ref 65–99)
PHOSPHORUS, SERUM: 4.5 mg/dL — ABNORMAL HIGH (ref 3.0–4.3)
POTASSIUM: 4.8 mmol/L (ref 3.5–5.2)
SODIUM: 138 mmol/L (ref 134–144)

## 2020-03-06 LAB — CBC W/ DIFFERENTIAL
BANDED NEUTROPHILS ABSOLUTE COUNT: 0.2 10*3/uL — ABNORMAL HIGH (ref 0.0–0.1)
BASOPHILS ABSOLUTE COUNT: 0 10*3/uL (ref 0.0–0.2)
BASOPHILS RELATIVE PERCENT: 1 %
EOSINOPHILS ABSOLUTE COUNT: 0.1 10*3/uL (ref 0.0–0.4)
EOSINOPHILS RELATIVE PERCENT: 4 %
HEMATOCRIT: 33.7 % — ABNORMAL LOW (ref 34.0–46.6)
HEMOGLOBIN: 11.1 g/dL (ref 11.1–15.9)
IMMATURE GRANULOCYTES: 5 %
LYMPHOCYTES ABSOLUTE COUNT: 0.8 10*3/uL (ref 0.7–3.1)
LYMPHOCYTES RELATIVE PERCENT: 20 %
MEAN CORPUSCULAR HEMOGLOBIN CONC: 32.9 g/dL (ref 31.5–35.7)
MEAN CORPUSCULAR HEMOGLOBIN: 30.2 pg (ref 26.6–33.0)
MEAN CORPUSCULAR VOLUME: 92 fL (ref 79–97)
MONOCYTES ABSOLUTE COUNT: 0.4 10*3/uL (ref 0.1–0.9)
MONOCYTES RELATIVE PERCENT: 10 %
NEUTROPHILS ABSOLUTE COUNT: 2.2 10*3/uL (ref 1.4–7.0)
NEUTROPHILS RELATIVE PERCENT: 60 %
PLATELET COUNT: 224 10*3/uL (ref 150–450)
RED BLOOD CELL COUNT: 3.67 x10E6/uL — ABNORMAL LOW (ref 3.77–5.28)
RED CELL DISTRIBUTION WIDTH: 13.8 % (ref 11.7–15.4)
WHITE BLOOD CELL COUNT: 3.7 10*3/uL (ref 3.4–10.8)

## 2020-03-06 LAB — MAGNESIUM: MAGNESIUM: 1.5 mg/dL — ABNORMAL LOW (ref 1.6–2.3)

## 2020-03-06 MED ADMIN — lidocaine (XYLOCAINE) 10 mg/mL (1 %) injection 10 mL: 10 mL | INTRADERMAL | @ 15:00:00 | Stop: 2020-03-06

## 2020-03-06 MED ADMIN — vasopressin (VASOSTRICT) injection 5 Units: 5 [IU] | INTRAVENOUS | @ 15:00:00 | Stop: 2020-03-06

## 2020-03-06 NOTE — Unmapped (Signed)
Referring Provider: Referred Self  No address on file    CC: Cervical Dysplasia    HPI: [name] is a 29 y.o. G0 with LMP of No LMP recorded (lmp unknown). who presents today for evaluation and management of abnormal cervical cytology.  She denies post-coital bleeding, abnormal uterine bleeding or vaginal discharge. She is not currently pregnant.    Dysplasia History:  1.  01/2020: HSIL, HPV+    Medications:   Current Outpatient Medications:   ???  acetaminophen (TYLENOL) 500 MG tablet, Take 1-2 tablets (500-1,000 mg total) by mouth every six (6) hours as needed for pain or fever (> 38C)., Disp: 100 tablet, Rfl: 0  ???  cholecalciferol, vitamin D3-125 mcg, 5,000 unit,, 125 mcg (5,000 unit) capsule, Take 1 capsule (125 mcg total) by mouth daily., Disp: 100 capsule, Rfl: 11  ???  clonazePAM (KLONOPIN) 0.5 MG tablet, Take 1 tablet (0.5 mg total) by mouth two (2) times a day., Disp: 60 tablet, Rfl: 3  ???  levETIRAcetam (KEPPRA) 250 MG tablet, TAKE 1 TABLET BY MOUTH TWICE DAILY., Disp: 180 tablet, Rfl: 3  ???  magnesium oxide-Mg AA chelate (MAGNESIUM, AMINO ACID CHELATE,) 133 mg Tab, Take 2 tablets by mouth Two (2) times a day., Disp: 120 tablet, Rfl: 11  ???  mycophenolate (MYFORTIC) 180 MG EC tablet, Take 4 tablets (720 mg total) by mouth Two (2) times a day. Adjust dose per medication card., Disp: 240 tablet, Rfl: 11  ???  predniSONE (DELTASONE) 5 MG tablet, Take 1 tablet (5 mg total) by mouth daily., Disp: 30 tablet, Rfl: 11  ???  sodium bicarbonate 650 mg tablet, Take 2 tablets (1,300 mg total) by mouth Two (2) times a day., Disp: 120 tablet, Rfl: 11  ???  tacrolimus (ENVARSUS XR) 1 mg Tb24 extended release tablet, Take ONE (1mg ) tablet with TWO (4mg ) tablets by mouth daily for a total daily dose of 9mg ., Disp: 30 tablet, Rfl: 11  ???  tacrolimus (ENVARSUS XR) 4 mg Tb24 extended release tablet, Take TWO (4mg ) tablets with ONE (1mg ) tablet by mouth daily for a total daily dose of 9mg ., Disp: 90 tablet, Rfl: 11  ???  aspirin (ECOTRIN) 81 MG tablet, Take 1 tablet (81 mg total) by mouth daily. (Patient not taking: Reported on 03/06/2020), Disp: 30 tablet, Rfl: 11  Antiretroviral medications:  no  Immunosuppressant medications:  yes    Allergies:  Bee pollen and Pollen extracts    Past medical history:   Past Medical History:   Diagnosis Date   ??? Abnormal Pap smear of cervix    ??? 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 Depressive disorder, not elsewhere classified    ??? Migraine with aura, without mention of intractable migraine without mention of status migrainosus    ??? Seizures (CMS-HCC)    ??? Unspecified vitamin B deficiency      -Hx of HIV: No  -Hx of HIV testing: Yes, last test in 2013     Past surgical history:  Past Surgical History:   Procedure Laterality Date   ??? AV FISTULA PLACEMENT Left    ??? PR TRANSPLANT,PREP CADAVER RENAL GRAFT N/A 03/10/2019    Procedure: [facility] STD PREP CAD DONR RENAL ALLOGFT PRIOR TO TRNSPLNT, INCL DISSEC/REM PERINEPH FAT, DIAPH/RTPER ATTAC;  Surgeon: Leona Carry, MD;  Location: MAIN OR [facility];  Service: Transplant   ??? PR TRANSPLANTATION OF KIDNEY N/A 03/10/2019    Procedure: RENAL ALLOTRANSPLANTATION, IMPLANTATION OF GRAFT; WITHOUT RECIPIENT NEPHRECTOMY;  Surgeon:  Leona Carry, MD;  Location: MAIN OR [facility];  Service: Transplant        Obstetric history:  OB History    No obstetric history on file.         Gynecologic history:   Contraception:none, reports not currently sexually active  -Current condom use: no  Hx of STI: denies  Hx of HPV vaccination: reports completion of series    Social history:  -Tobacco Use:     -Current: no    -Past: no  -EtOH Use: no  -Drug Use: no  -Sexually active: no, current contraception: Not applicable  -Domestic violence concerns: no, endorses safety in the home  -Employment: Full time student    Family history:  Family History   Problem Relation Age of Onset   ??? Migraines Mother    ??? Diabetes Father    ??? Hypertension Father    ??? Seizures Brother    ??? Diabetes Maternal Grandmother    ??? Hypertension Maternal Grandmother    ??? Heart attack Maternal Grandmother        Review of systems:   Otherwise negative across the balance of 10 systems except as noted per HPI.    Physical exam:  BP 117/83 (BP Site: R Arm, BP Position: Sitting, BP Cuff Size: Medium)  - Pulse 90  - Wt 77.6 kg (171 lb 1.6 oz)  - LMP  (LMP Unknown)     -General: NAD. The patient is awake and alert.  -Psych: Appropriate patient interaction and affect. The patient is appropriately interactive.  -Abd: Soft, non-tender, non-distended.  -Genitourinary:      -EGBUS are normal.      PROCEDURE NOTE:  After informed consent was obtained and a UPT returned negative, the patient was placed in the dorsal lithotomy position and a sterile speculum exam was performed using a flame-retardant speculum revealing the cervix without difficulty.  Normal well-rugated vaginal mucosa was seen on exam.    Lugol's solution was applied to the cervix. Overall the cervix appeared to stain well with the Lugol solution. Betadine was then applied and a tenaculum was placed at the anterior lip of the cervix. A paracervical block was obtained with 20 mL of prepared parcervical block solution.    A LEEP was then performed in the standard fashion. An ECC was then performed. Hemostasis was obtained using electrocautery and Monsel's solution. The speculum was then removed. A mark was placed at the 12 o'clock position of the LEEP specimen for pathology orientation. The patient tolerated the procedure well.    Assessment:  29 y.o. G0 who presents today for LEEP, stable post procedure.    Plan:  RTC in 6 and 12 months for repeat pap with HPV cotesting. We will call the patient at  [phone] (home) to inform her of the results from today's biopsy.    Dr. Mariann Barter was present for the entire procedure and in agreement with above.

## 2020-03-07 LAB — TACROLIMUS LEVEL: TACROLIMUS BLOOD: 7.9 ng/mL (ref 2.0–20.0)

## 2020-03-08 LAB — CMV DNA, QUANTITATIVE, PCR: CMV QUANT: NEGATIVE [IU]/mL

## 2020-03-11 MED FILL — LEVETIRACETAM 250 MG TABLET: 90 days supply | Qty: 180 | Fill #3 | Status: AC

## 2020-03-11 MED FILL — LEVETIRACETAM 250 MG TABLET: ORAL | 90 days supply | Qty: 180 | Fill #3

## 2020-03-13 ENCOUNTER — Other Ambulatory Visit: Payer: Self-pay | Admitting: *Deleted

## 2020-03-13 LAB — CBC W/ DIFFERENTIAL
BASOPHILS ABSOLUTE COUNT: 0.1 10*3/uL (ref 0.0–0.2)
BASOPHILS RELATIVE PERCENT: 3 %
EOSINOPHILS ABSOLUTE COUNT: 0 10*3/uL (ref 0.0–0.4)
EOSINOPHILS RELATIVE PERCENT: 1 %
HEMATOCRIT: 33.6 % — ABNORMAL LOW (ref 34.0–46.6)
HEMOGLOBIN: 11.1 g/dL (ref 11.1–15.9)
LYMPHOCYTES ABSOLUTE COUNT: 0.3 10*3/uL — ABNORMAL LOW (ref 0.7–3.1)
LYMPHOCYTES RELATIVE PERCENT: 9 %
MEAN CORPUSCULAR HEMOGLOBIN CONC: 33 g/dL (ref 31.5–35.7)
MEAN CORPUSCULAR HEMOGLOBIN: 30.2 pg (ref 26.6–33.0)
MEAN CORPUSCULAR VOLUME: 91 fL (ref 79–97)
MONOCYTES ABSOLUTE COUNT: 0.4 10*3/uL (ref 0.1–0.9)
MONOCYTES RELATIVE PERCENT: 11 %
NEUTROPHILS ABSOLUTE COUNT: 2.9 10*3/uL (ref 1.4–7.0)
NEUTROPHILS RELATIVE PERCENT: 76 %
PLATELET COUNT: 232 10*3/uL (ref 150–450)
RED BLOOD CELL COUNT: 3.68 x10E6/uL — ABNORMAL LOW (ref 3.77–5.28)
RED CELL DISTRIBUTION WIDTH: 13.2 % (ref 11.7–15.4)
WHITE BLOOD CELL COUNT: 3.8 10*3/uL (ref 3.4–10.8)

## 2020-03-13 LAB — RENAL FUNCTION PANEL
ALBUMIN: 4.4 g/dL (ref 3.9–5.0)
BLOOD UREA NITROGEN: 24 mg/dL — ABNORMAL HIGH (ref 6–20)
BUN / CREAT RATIO: 23 (ref 9–23)
CALCIUM: 9.9 mg/dL (ref 8.7–10.2)
CHLORIDE: 104 mmol/L (ref 96–106)
CO2: 20 mmol/L (ref 20–29)
CREATININE: 1.04 mg/dL — ABNORMAL HIGH (ref 0.57–1.00)
GFR MDRD AF AMER: 83 mL/min/{1.73_m2}
GFR MDRD NON AF AMER: 72 mL/min/{1.73_m2}
GLUCOSE: 99 mg/dL (ref 65–99)
PHOSPHORUS, SERUM: 3.8 mg/dL (ref 3.0–4.3)
POTASSIUM: 4.9 mmol/L (ref 3.5–5.2)
SODIUM: 135 mmol/L (ref 134–144)

## 2020-03-13 LAB — MAGNESIUM: MAGNESIUM: 1.6 mg/dL (ref 1.6–2.3)

## 2020-03-14 NOTE — Unmapped (Signed)
Addended byVladimir Crofts on: 03/13/2020 11:28 PM     Modules accepted: Level of Service

## 2020-03-17 LAB — TACROLIMUS LEVEL: TACROLIMUS BLOOD: 9 ng/mL (ref 2.0–20.0)

## 2020-03-18 DIAGNOSIS — Z940 Kidney transplant status: Principal | ICD-10-CM

## 2020-03-20 LAB — CBC W/ DIFFERENTIAL
BANDED NEUTROPHILS ABSOLUTE COUNT: 0.2 10*3/uL — ABNORMAL HIGH (ref 0.0–0.1)
BASOPHILS ABSOLUTE COUNT: 0 10*3/uL (ref 0.0–0.2)
BASOPHILS RELATIVE PERCENT: 1 %
EOSINOPHILS ABSOLUTE COUNT: 0.2 10*3/uL (ref 0.0–0.4)
EOSINOPHILS RELATIVE PERCENT: 4 %
HEMATOCRIT: 32.8 % — ABNORMAL LOW (ref 34.0–46.6)
HEMOGLOBIN: 10.9 g/dL — ABNORMAL LOW (ref 11.1–15.9)
IMMATURE GRANULOCYTES: 4 %
LYMPHOCYTES ABSOLUTE COUNT: 0.9 10*3/uL (ref 0.7–3.1)
LYMPHOCYTES RELATIVE PERCENT: 21 %
MEAN CORPUSCULAR HEMOGLOBIN CONC: 33.2 g/dL (ref 31.5–35.7)
MEAN CORPUSCULAR HEMOGLOBIN: 30.2 pg (ref 26.6–33.0)
MEAN CORPUSCULAR VOLUME: 91 fL (ref 79–97)
MONOCYTES ABSOLUTE COUNT: 0.4 10*3/uL (ref 0.1–0.9)
MONOCYTES RELATIVE PERCENT: 9 %
NEUTROPHILS ABSOLUTE COUNT: 2.6 10*3/uL (ref 1.4–7.0)
NEUTROPHILS RELATIVE PERCENT: 61 %
PLATELET COUNT: 232 10*3/uL (ref 150–450)
RED BLOOD CELL COUNT: 3.61 x10E6/uL — ABNORMAL LOW (ref 3.77–5.28)
RED CELL DISTRIBUTION WIDTH: 13.2 % (ref 11.7–15.4)
WHITE BLOOD CELL COUNT: 4.3 10*3/uL (ref 3.4–10.8)

## 2020-03-20 LAB — RENAL FUNCTION PANEL
ALBUMIN: 4.1 g/dL (ref 3.9–5.0)
BLOOD UREA NITROGEN: 26 mg/dL — ABNORMAL HIGH (ref 6–20)
BUN / CREAT RATIO: 23 (ref 9–23)
CALCIUM: 9.9 mg/dL (ref 8.7–10.2)
CHLORIDE: 105 mmol/L (ref 96–106)
CO2: 20 mmol/L (ref 20–29)
CREATININE: 1.12 mg/dL — ABNORMAL HIGH (ref 0.57–1.00)
GFR MDRD AF AMER: 76 mL/min/{1.73_m2}
GFR MDRD NON AF AMER: 66 mL/min/{1.73_m2}
GLUCOSE: 95 mg/dL (ref 65–99)
PHOSPHORUS, SERUM: 3.8 mg/dL (ref 3.0–4.3)
POTASSIUM: 4.7 mmol/L (ref 3.5–5.2)
SODIUM: 136 mmol/L (ref 134–144)

## 2020-03-20 LAB — MAGNESIUM: MAGNESIUM: 1.6 mg/dL (ref 1.6–2.3)

## 2020-03-21 LAB — TACROLIMUS LEVEL: TACROLIMUS BLOOD: 12.6 ng/mL (ref 2.0–20.0)

## 2020-03-22 DIAGNOSIS — Z79899 Other long term (current) drug therapy: Principal | ICD-10-CM

## 2020-03-22 DIAGNOSIS — Z940 Kidney transplant status: Principal | ICD-10-CM

## 2020-03-22 LAB — CMV DNA, QUANTITATIVE, PCR: CMV QUANT: NEGATIVE [IU]/mL

## 2020-03-22 MED ORDER — TACROLIMUS XR 1 MG TABLET,EXTENDED RELEASE 24 HR
ORAL_TABLET | Freq: Every day | ORAL | 11 refills | 0.00000 days | Status: CP
Start: 2020-03-22 — End: ?

## 2020-03-22 MED ORDER — TACROLIMUS XR 4 MG TABLET,EXTENDED RELEASE 24 HR
ORAL_TABLET | Freq: Every day | ORAL | 11 refills | 30.00000 days | Status: CP
Start: 2020-03-22 — End: ?
  Filled 2020-03-29: qty 90, 30d supply, fill #0

## 2020-03-22 NOTE — Unmapped (Signed)
Tacrolimus level 12.6 reviewed with Dr Margaretmary Bayley. Patient advised to decrease envarsus dose to 7 mg daily.

## 2020-03-25 NOTE — Unmapped (Signed)
Update unos form

## 2020-03-27 NOTE — Unmapped (Signed)
Clinical Assessment Needed For: Dose Change  Medication: Envarsus XR 1mg  tablet  Last Fill Date/Day Supply: 03/04/2020 / 30 days  Copay $0  Was previous dose already scheduled to fill: No    Notes to Pharmacist: N/A    Clinical Assessment Needed For: Dose Change  Medication: Envarsus XR 4mg  tablet  Last Fill Date/Day Supply: 03/04/2020 / 30 days  Refill Too Soon until 03/27/2020  Was previous dose already scheduled to fill: No    Notes to Pharmacist: Will re-test tomorrow, 01/05

## 2020-03-27 NOTE — Unmapped (Signed)
Therapy Update Follow Up: No issues - Copay = $0

## 2020-03-28 MED ORDER — MG-PLUS-PROTEIN 133 MG TABLET
ORAL_TABLET | Freq: Two times a day (BID) | ORAL | 11 refills | 30 days | Status: CP
Start: 2020-03-28 — End: ?
  Filled 2020-04-02: qty 120, 30d supply, fill #0

## 2020-03-28 NOTE — Unmapped (Signed)
[facility] Refill Coordination Note    Specialty Medication(s) to be Shipped:   Transplant: Envarsus 1mg , Envarsus 4mg ,  mycophenolic acid 180mg  and Prednisone 5mg      Other medication(s) to be shipped:      Sodium Bircarb     Carolyn Kelley, DOB: Apr 16, 1990  Phone: [phone] (home)       All above HIPAA information was verified with patient.     Was a Nurse, learning disability used for this call? No    Completed refill call assessment today to schedule patient's medication shipment from the [facility] [phone]).       Specialty medication(s) and dose(s) confirmed: Envarsus dose change to 7mg  Daily   Changes to medications: [place] reports no changes at this time.  Changes to insurance: No  Questions for the pharmacist: No    Confirmed patient received Welcome Packet with first shipment. The patient will receive a drug information handout for each medication shipped and additional FDA Medication Guides as required.       DISEASE/MEDICATION-SPECIFIC INFORMATION        N/A    SPECIALTY MEDICATION ADHERENCE     Medication Adherence    Patient reported X missed doses in the last month: 0        envarsus 1 mg: 8 days of medicine on hand   envarsus 4 mg: 8 days of medicine on hand   mycophenolate 180 mg: 8 days of medicine on hand   Prednisone 5mg : 8 days worth of medication on hand.            SHIPPING     Shipping address confirmed in Epic.     Delivery Scheduled: Yes, Expected medication delivery date: 03/29/20.     Medication will be delivered via UPS to the prescription address in Epic WAM.    [place] A Alek Borges   [facility] Specialty Technician

## 2020-03-28 NOTE — Unmapped (Signed)
Pt request for RX Refill magnesium oxide-Mg AA chelate (MAGNESIUM, AMINO ACID CHELATE,) 133 mg Tab

## 2020-03-29 ENCOUNTER — Ambulatory Visit: Admit: 2020-03-29 | Discharge: 2020-03-30 | Payer: PRIVATE HEALTH INSURANCE

## 2020-03-29 ENCOUNTER — Ambulatory Visit
Admit: 2020-03-29 | Discharge: 2020-03-30 | Payer: PRIVATE HEALTH INSURANCE | Attending: Nephrology | Primary: Nephrology

## 2020-03-29 DIAGNOSIS — N281 Cyst of kidney, acquired: Principal | ICD-10-CM

## 2020-03-29 DIAGNOSIS — Z09 Encounter for follow-up examination after completed treatment for conditions other than malignant neoplasm: Principal | ICD-10-CM

## 2020-03-29 DIAGNOSIS — G40909 Epilepsy, unspecified, not intractable, without status epilepticus: Principal | ICD-10-CM

## 2020-03-29 DIAGNOSIS — Z940 Kidney transplant status: Principal | ICD-10-CM

## 2020-03-29 DIAGNOSIS — R197 Diarrhea, unspecified: Principal | ICD-10-CM

## 2020-03-29 DIAGNOSIS — R519 Headache, unspecified: Principal | ICD-10-CM

## 2020-03-29 DIAGNOSIS — N186 End stage renal disease: Principal | ICD-10-CM

## 2020-03-29 DIAGNOSIS — I120 Hypertensive chronic kidney disease with stage 5 chronic kidney disease or end stage renal disease: Principal | ICD-10-CM

## 2020-03-29 DIAGNOSIS — Z7982 Long term (current) use of aspirin: Principal | ICD-10-CM

## 2020-03-29 DIAGNOSIS — E875 Hyperkalemia: Principal | ICD-10-CM

## 2020-03-29 DIAGNOSIS — E872 Acidosis: Principal | ICD-10-CM

## 2020-03-29 DIAGNOSIS — I959 Hypotension, unspecified: Principal | ICD-10-CM

## 2020-03-29 DIAGNOSIS — R0902 Hypoxemia: Principal | ICD-10-CM

## 2020-03-29 DIAGNOSIS — D849 Immunodeficiency, unspecified: Principal | ICD-10-CM

## 2020-03-29 DIAGNOSIS — I10 Essential (primary) hypertension: Principal | ICD-10-CM

## 2020-03-29 DIAGNOSIS — G40309 Generalized idiopathic epilepsy and epileptic syndromes, not intractable, without status epilepticus: Principal | ICD-10-CM

## 2020-03-29 LAB — CBC W/ AUTO DIFF
BASOPHILS ABSOLUTE COUNT: 0 10*9/L (ref 0.0–0.1)
BASOPHILS RELATIVE PERCENT: 0.7 %
EOSINOPHILS ABSOLUTE COUNT: 0.1 10*9/L (ref 0.0–0.7)
EOSINOPHILS RELATIVE PERCENT: 2.8 %
HEMATOCRIT: 35.3 % (ref 35.0–44.0)
HEMOGLOBIN: 11.5 g/dL — ABNORMAL LOW (ref 12.0–15.5)
LYMPHOCYTES ABSOLUTE COUNT: 0.7 10*9/L (ref 0.7–4.0)
LYMPHOCYTES RELATIVE PERCENT: 18.9 %
MEAN CORPUSCULAR HEMOGLOBIN CONC: 32.6 g/dL (ref 30.0–36.0)
MEAN CORPUSCULAR HEMOGLOBIN: 29.9 pg (ref 26.0–34.0)
MEAN CORPUSCULAR VOLUME: 91.7 fL (ref 82.0–98.0)
MEAN PLATELET VOLUME: 7.4 fL (ref 7.0–10.0)
MONOCYTES ABSOLUTE COUNT: 0.3 10*9/L (ref 0.1–1.0)
MONOCYTES RELATIVE PERCENT: 9 %
NEUTROPHILS ABSOLUTE COUNT: 2.5 10*9/L (ref 1.7–7.7)
NEUTROPHILS RELATIVE PERCENT: 68.6 %
PLATELET COUNT: 241 10*9/L (ref 150–450)
RED BLOOD CELL COUNT: 3.85 10*12/L — ABNORMAL LOW (ref 3.90–5.03)
RED CELL DISTRIBUTION WIDTH: 14.3 % (ref 12.0–15.0)
WBC ADJUSTED: 3.6 10*9/L (ref 3.5–10.5)

## 2020-03-29 LAB — BASIC METABOLIC PANEL
ANION GAP: 4 mmol/L — ABNORMAL LOW (ref 5–14)
BLOOD UREA NITROGEN: 23 mg/dL (ref 9–23)
BUN / CREAT RATIO: 23
CALCIUM: 10.6 mg/dL — ABNORMAL HIGH (ref 8.7–10.4)
CHLORIDE: 107 mmol/L (ref 98–107)
CO2: 26.7 mmol/L (ref 20.0–31.0)
CREATININE: 1.02 mg/dL — ABNORMAL HIGH
EGFR CKD-EPI AA FEMALE: 85 mL/min/{1.73_m2} (ref >=60)
EGFR CKD-EPI NON-AA FEMALE: 74 mL/min/{1.73_m2} (ref >=60)
GLUCOSE RANDOM: 96 mg/dL (ref 70–179)
POTASSIUM: 4.5 mmol/L (ref 3.4–4.5)
SODIUM: 138 mmol/L (ref 135–145)

## 2020-03-29 LAB — HEMOGLOBIN A1C
ESTIMATED AVERAGE GLUCOSE: 97 mg/dL
HEMOGLOBIN A1C: 5 % (ref 4.8–5.6)

## 2020-03-29 LAB — MAGNESIUM: MAGNESIUM: 1.8 mg/dL (ref 1.6–2.6)

## 2020-03-29 LAB — PROTEIN / CREATININE RATIO, URINE
CREATININE, URINE: 140 mg/dL
PROTEIN URINE: 135.3 mg/dL
PROTEIN/CREAT RATIO, URINE: 0.966

## 2020-03-29 LAB — PHOSPHORUS: PHOSPHORUS: 3.8 mg/dL (ref 2.4–5.1)

## 2020-03-29 LAB — URINALYSIS
BILIRUBIN UA: NEGATIVE
GLUCOSE UA: NEGATIVE
KETONES UA: NEGATIVE
LEUKOCYTE ESTERASE UA: NEGATIVE
NITRITE UA: NEGATIVE
PH UA: 6 (ref 5.0–9.0)
PROTEIN UA: 100 — AB
RBC UA: 4 /HPF — ABNORMAL HIGH (ref <4)
SPECIFIC GRAVITY UA: >=1.03 (ref 1.005–1.030)
SQUAMOUS EPITHELIAL: 16 /HPF — ABNORMAL HIGH (ref 0–5)
TRANSITIONAL EPITHELIAL: <1 /HPF (ref 0–2)
UROBILINOGEN UA: 0.2
WBC UA: 10 /HPF — ABNORMAL HIGH (ref 0–5)

## 2020-03-29 LAB — SLIDE REVIEW

## 2020-03-29 MED FILL — ENVARSUS XR 4 MG TABLET,EXTENDED RELEASE: ORAL | 30 days supply | Qty: 30 | Fill #0

## 2020-03-29 MED FILL — MYCOPHENOLATE SODIUM 180 MG TABLET,DELAYED RELEASE: ORAL | 30 days supply | Qty: 240 | Fill #3

## 2020-03-29 MED FILL — SODIUM BICARBONATE 650 MG TABLET: ORAL | 30 days supply | Qty: 120 | Fill #2

## 2020-03-29 MED FILL — PREDNISONE 5 MG TABLET: ORAL | 30 days supply | Qty: 30 | Fill #2

## 2020-03-29 NOTE — Unmapped (Signed)
AOBP performed right arm, meduim cuff.    Average - 118/80, p 89   1st reading - 118/81, p 91   2nd reading - 116/79, p 88    3rd reading- 119/81, p 88

## 2020-03-29 NOTE — Unmapped (Signed)
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 She is seen in follow up of her transplant and to address other medical problems. 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 C3 was 82 and C4 30.3 on 01/26/20. The C3G functional panel revealed a mild elevation of uncontrolled complement driven by C4 nephritic factors. Genetic panels appear to still be pending. We increased the dose of Myfortic to 720 mg bid and started prednisone 5 mg daily after recurrence was confirmed and before the functional studies. Serum creatinine today is stable at 1.02 mg/dL though UPC has increased to 0.966 and UA reveals pyuria and hematuria with negative urine cultures.   - Plan Rituximab once the current COVID-19 spike begins to decline.   - If unresponsive to Rituximab will consider Eculizumab.   - Proceed with plans for vaccination and measures to protect against COVID-19 and meningococcus in preparation for possible future therapy (see below).    - Repeat C3, C4, C3 glomerulopathy functional panel  - Await return of genetics panel  - Continue current immunosuppression for now    Immunosuppression Management [High Risk Medical Decision Making For Drug Therapy Requiring Intensive Monitoring For Toxicity]:   - Tacrolimus level 6.9 (target 6-10 ng/mL).   - Envarsus 7 mg daily dose to be continued   - Higher dose Myfortic 720 mg bid to be continued due to C3 glomerulopathy recurrence  - Prednisone 5 mg daily started 01/05/20 due to C3 glomerulopathy recurrence, previously steroid free, will continue current dose    Infectious Prophylaxis and Monitoring: CMV & EBV: D+/R+  - PJP prophylaxis: completed (s/p Bactrim x 6 months)  - CMV prophylaxis: completed (s/p Valcyte x3 months).   - COVID-19 vaccination: completed including 3rd dose 11/29/19. She is aware of the limited efficacy of this vaccine in transplant recipients. Will plan a booster after 04/30/20.    - Received Men B on 01/26/20 and 03/01/20  - Received MenACWY 01/26/20, with second dose to be given at next visit.   - Booster doses of meningococcal vaccine every 1-5 years per CDC recommendations if chronic eculizumab therapy if is initiated.  - Consider Evusheld when available per policy guidelines. She will need a COVID-19 spike protein IgG test to see if she qualifies.     Seizures Disorder, no seizures since 2014:    Will continue Keppra.  She will follow up with [name], NP at [facility].  Last visit on 05/24/19, with yearly follow ups.     Myoclonus:   Will continue Klonopin, prescribed by PCP.  Saw [facility] physical therapy.  Continues to use walker.     Metabolic Acidosis and Hyperkalemia, resolved with therapy  CO2 is now normal (26.7) and K is 4.5  Sodium bicarbonate 1300 mg bid to be continued.    Pseudoaneurysm of AVF  She saw Dr. Norma Fredrickson 09/21/19 with plan to defer any intervention until 1 year has passed from transplant. She has no high output heart failure by echo or symptoms of CHF. We will coordinate with Dr. Norma Fredrickson. Favor a nonurgent revision to minimize any risk of rupture, but spare the AVF for future use if possible to do so safely. Patient advised to contact us with any scabbing or  erosion of the skin over the pseudoaneurysm.    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 Leep scheduled for 03/06/20.     Immunizations:   Immunization History   Administered Date(s) Administered   ??? COVID-19 VACC,MRNA,(PFIZER)(PF)(IM) 07/06/2019, 07/31/2019, 11/29/2019   ??? Hepatitis B, Adult 01/21/2012, 01/21/2012, 02/16/2012, 02/16/2012, 03/17/2012, 03/17/2012, 07/14/2012, 07/14/2012   ??? HiB, unspecified 04/29/2011   ??? INFLUENZA TIV (TRI) 73MO+ W/ PRESERV (IM) 03/31/2011   ??? Influenza Vaccine Quad (IIV4 PF) 49mo+ injectable 12/15/2018, 12/15/2018, 12/15/2018, 11/29/2019   ??? Influenza Vaccine Quad (IIV4 W/PRESERV) 73MO+ 12/16/2017   ??? Meningococcal B Vaccine, OMV Adjuvanted 01/26/2020, 03/01/2020   ??? Meningococcal C Conjugate 04/29/2011   ??? Meningococcal conjugate vaccine, serogroups A, C, Y and W-135 (tetravalent), for intramuscular use 01/26/2020   ??? PNEUMOCOCCAL POLYSACCHARIDE 23 02/04/2012, 02/04/2012, 01/26/2020   ??? PPD Test 05/01/2011   ??? Pneumococcal Conjugate 13-Valent 02/23/2019, 02/23/2019     Follow up  3 months    History of Present Illness    She is a 30 y.o. 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 Her post-operative course was complicated by hypotension and hypoxia thought to be caused by anaphylactic reaction to Campath. She has experienced no episodes of rejection or infectious complications. Due to presence of proteinuria she underwent kidney biopsy on 12/29/19. The biopsy showed C3 glomerulopathy recurrence. In response to this diagnosis Myfortic was increased from 540 mg bid to 720 mg bid and she was started on prednisone 5 mg daily while tacrolimus was continued.    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 She is scheduled for GYN follow up in 6 months.     She presents today with complaint of early morning headaches. These tend to resolve quickly and are not severe. She is otherwise doing well.  She denies chest pain, shortness of breath, fever, chills, abdominal pain, n/v/d, edema, dysuria, hematuria, pain/burning with urination, and allografttenerness.     Last dose of Prograf: 11 AM yesterday.     Transplant History:  Date of Transplant: 03/10/19  Organ Received: Left DDKT, DBD, SCD, KDPI 2%; cold ischemia 18 hrs.  Native Kidney Disease: C3 glomerulonephropathy  Pre-transplant CPRA:  0%  Post-Transplant Course: Complicated by anaphylactic reaction to Campath in OR  Induction: Campath  Date of Ureteral Stent Removal: 04/17/19  CMV/EBV Status: CMV D+/R+, EBV D+/R+  Rejection Episodes: None  Donor Specific Antibodies: Negative (most recent 01/26/20)   Biopsy 12/29/19: C3 glomerulopathy recurrence, no rejection    Results of Renal Imaging (pre and post):     Pre-Txp 01/18/17  Right interpolar cyst 1.4 x 1.3 x 1.1 cm (previously 0.9 x 0.9 x 0.9 cm), second, smaller cyst in the interpolar region measures 1.1 x 1.1 x 1.1 cm, left interpolar region cystic lesion with tiny peripheral echogenic focus measuring 0.7 x 0.8 x 0.7 cm. Multiple other small subcentimeter hypodensities are seen, too small to characterize. No hydronephrosis or renal calculi.     CT Renal Mass Protocol 02/25/2018  Bilateral atrophic kidneys with multiple simple cysts. No enhancing soft tissue renal mass. No hydronephrosis or calculi.    Post-Txp 03/10/19 (txp kidney only)  Normal size and echogenicity.  No solid masses or calculi. No perinephric collections identified. No hydronephrosis  Other Past Medical History  1. C3 Glomerulopathy  2. Seizure Disorder  3. 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 HTN  5. 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    Medications  Current Outpatient Medications   Medication Sig Dispense Refill   ??? acetaminophen (TYLENOL) 500 MG tablet Take 1-2 tablets (500-1,000 mg total) by mouth every six (6) hours as needed for pain or fever (> 38C). 100 tablet 0   ??? cholecalciferol, vitamin D3-125 mcg, 5,000 unit,, 125 mcg (5,000 unit) capsule Take 1 capsule (125 mcg total) by mouth daily. 100 capsule 11   ??? clonazePAM (KLONOPIN) 0.5 MG tablet Take 1 tablet (0.5 mg total) by mouth two (2) times a day. 60 tablet 3 ??? levETIRAcetam (KEPPRA) 250 MG tablet TAKE 1 TABLET BY MOUTH TWICE DAILY. 180 tablet 3   ??? magnesium oxide-Mg AA chelate (MAGNESIUM, AMINO ACID CHELATE,) 133 mg Tab Take 2 tablets by mouth Two (2) times a day. 120 tablet 11   ??? mycophenolate (MYFORTIC) 180 MG EC tablet Take 4 tablets (720 mg total) by mouth Two (2) times a day. Adjust dose per medication card. 240 tablet 11   ??? predniSONE (DELTASONE) 5 MG tablet Take 1 tablet (5 mg total) by mouth daily. 30 tablet 11   ??? sodium bicarbonate 650 mg tablet Take 2 tablets (1,300 mg total) by mouth Two (2) times a day. 120 tablet 11   ??? tacrolimus (ENVARSUS XR) 1 mg Tb24 extended release tablet Take three (1mg ) tablets with one (4mg ) tablet by mouth daily for a total daily dose of 7 mg. 90 tablet 11   ??? tacrolimus (ENVARSUS XR) 4 mg Tb24 extended release tablet Take one (4mg ) tablet with three (1mg ) tablets by mouth daily for a total daily dose of 7 mg. 30 tablet 11   ??? aspirin (ECOTRIN) 81 MG tablet Take 1 tablet (81 mg total) by mouth daily. (Patient not taking: Reported on 03/29/2020) 30 tablet 11     No current facility-administered medications for this visit.       Physical Exam  BP 118/80 (BP Site: R Arm, BP Position: Sitting, BP Cuff Size: Medium)  - Pulse 89  - Temp 36.6 ??C (97.9 ??F) (Temporal)  - Wt 77.2 kg (170 lb 3.2 oz)  - LMP  (LMP Unknown)  - BMI 31.13 kg/m??    General: Patient is a pleasant female in no apparent distress.  Eyes: Sclera anicteric.  Neck: Supple without LAD/JVD/bruits.  Lungs: Clear to auscultation bilaterally, no wheezes/rales/rhonchi.  Cardiovascular: Regular rate and rhythm without murmurs, rubs or gallops.  Abdomen: Soft, notender/nondistended. Positive bowel sounds. No hepatosplenomegaly, masses or bruits appreciated.  Extremities: Without edema, joints without evidence of synovitis; pseudoaneurysm of AVF LUE  Skin: Without rash  Neurological: Grossly nonfocal; uses a walker for ambulation due to myoclonus  Psychiatric: Mood and affect appropriate.      Laboratory Results:  Recent Results (from the past 170 hour(s))   Magnesium Level    Collection Time: 03/29/20 12:17 PM   Result Value Ref Range    Magnesium 1.8 1.6 - 2.6 mg/dL   Phosphorus Level    Collection Time: 03/29/20 12:17 PM   Result Value Ref Range    Phosphorus 3.8 2.4 - 5.1 mg/dL   Basic Metabolic Panel    Collection Time: 03/29/20 12:17 PM   Result  Value Ref Range    Sodium 138 135 - 145 mmol/L    Potassium 4.5 3.4 - 4.5 mmol/L    Chloride 107 98 - 107 mmol/L    CO2 26.7 20.0 - 31.0 mmol/L    Anion Gap 4 (L) 5 - 14 mmol/L    BUN 23 9 - 23 mg/dL    Creatinine 2.95 (H) 0.60 - 0.80 mg/dL    BUN/Creatinine Ratio 23     EGFR CKD-EPI Non-African American, Female 74 >=60 mL/min/1.66m2    EGFR CKD-EPI African American, Female 15 >=60 mL/min/1.69m2    Glucose 96 70 - 179 mg/dL    Calcium 62.1 (H) 8.7 - 10.4 mg/dL   CBC w/ Differential    Collection Time: 03/29/20 12:17 PM   Result Value Ref Range    WBC 3.6 3.5 - 10.5 10*9/L    RBC 3.85 (L) 3.90 - 5.03 10*12/L    HGB 11.5 (L) 12.0 - 15.5 g/dL    HCT 30.8 65.7 - 84.6 %    MCV 91.7 82.0 - 98.0 fL    MCH 29.9 26.0 - 34.0 pg    MCHC 32.6 30.0 - 36.0 g/dL    RDW 96.2 95.2 - 84.1 %    MPV 7.4 7.0 - 10.0 fL    Platelet 241 150 - 450 10*9/L    Neutrophils % 68.6 %    Lymphocytes % 18.9 %    Monocytes % 9.0 %    Eosinophils % 2.8 %    Basophils % 0.7 %    Absolute Neutrophils 2.5 1.7 - 7.7 10*9/L    Absolute Lymphocytes 0.7 0.7 - 4.0 10*9/L    Absolute Monocytes 0.3 0.1 - 1.0 10*9/L    Absolute Eosinophils 0.1 0.0 - 0.7 10*9/L    Absolute Basophils 0.0 0.0 - 0.1 10*9/L

## 2020-03-29 NOTE — Unmapped (Signed)
Saw patient in clinic, reports she is doing well. Goes back to school on 13th.    Denies CP, SOB, dizziness, abdominal pain, n/v, urinary problems, swelling, numbness/tingling, or fever    Been waking up with HA, takes tylenol and they go away    +diarrhea this week, better now    Last dose of envarsus at 11am, labs today

## 2020-03-30 LAB — TACROLIMUS LEVEL, TROUGH: TACROLIMUS, TROUGH: 6.9 ng/mL (ref 5.0–15.0)

## 2020-04-01 DIAGNOSIS — Z940 Kidney transplant status: Principal | ICD-10-CM

## 2020-04-01 LAB — CMV DNA, QUANTITATIVE, PCR: CMV VIRAL LD: NOT DETECTED

## 2020-04-04 LAB — HLA DS POST TRANSPLANT
ANTI-DONOR DRW #1 MFI: 35 MFI
ANTI-DONOR HLA-A #1 MFI: 7 MFI
ANTI-DONOR HLA-B #1 MFI: 80 MFI
ANTI-DONOR HLA-B #2 MFI: 36 MFI
ANTI-DONOR HLA-C #1 MFI: 119 MFI
ANTI-DONOR HLA-C #2 MFI: 372 MFI
ANTI-DONOR HLA-DQB #1 MFI: 126 MFI
ANTI-DONOR HLA-DQB #2 MFI: 65 MFI
ANTI-DONOR HLA-DR #1 MFI: 31 MFI
ANTI-DONOR HLA-DR #2 MFI: 125 MFI

## 2020-04-04 LAB — FSAB CLASS 1 ANTIBODY SPECIFICITY: HLA CLASS 1 ANTIBODY RESULT: POSITIVE

## 2020-04-04 LAB — FSAB CLASS 2 ANTIBODY SPECIFICITY: HLA CL2 AB RESULT: POSITIVE

## 2020-04-09 ENCOUNTER — Telehealth: Payer: Self-pay | Admitting: *Deleted

## 2020-04-09 DIAGNOSIS — G253 Myoclonus: Secondary | ICD-10-CM

## 2020-04-09 DIAGNOSIS — Z940 Kidney transplant status: Principal | ICD-10-CM

## 2020-04-09 NOTE — Telephone Encounter (Signed)
Ms Mckiver called to get her clonazepam [facility].  Your last order says "appt needed to get further refills" but your last note in August '21 says return for F/U in one year. There is not one scheduled.

## 2020-04-09 NOTE — Telephone Encounter (Signed)
Dr Naaman Plummer note was reviewed, she can be seen her yearly. It can be PRN if her PCP or nephrologist will prescribe Clonazepam. She doesn't have a F/U appointment in the system. She needs to have an appointment and we can prescribe the Klonopin.

## 2020-04-09 NOTE — Unmapped (Signed)
Per dr Margaretmary Bayley would like patient to get covid IGG spike antibody this week.    Called and  Spoke to patient asking her to get labs to check her spike protein. She states she already had labs today. Asked her to get labs again when she can so we can check her spike protein. Educated her on evusheld.    Verbalized understanding and denies any needs

## 2020-04-10 LAB — RENAL FUNCTION PANEL
ALBUMIN: 4.3 g/dL (ref 3.9–5.0)
BLOOD UREA NITROGEN: 26 mg/dL — ABNORMAL HIGH (ref 6–20)
BUN / CREAT RATIO: 27 — ABNORMAL HIGH (ref 9–23)
CHLORIDE: 100 mmol/L (ref 96–106)
CO2: 23 mmol/L (ref 20–29)
CREATININE: 0.97 mg/dL (ref 0.57–1.00)
GFR MDRD AF AMER: 91 mL/min/{1.73_m2}
GFR MDRD NON AF AMER: 79 mL/min/{1.73_m2}
GLUCOSE: 95 mg/dL (ref 65–99)
PHOSPHORUS, SERUM: 4.6 mg/dL — ABNORMAL HIGH (ref 3.0–4.3)
POTASSIUM: 4.4 mmol/L (ref 3.5–5.2)
SODIUM: 135 mmol/L (ref 134–144)

## 2020-04-10 LAB — CBC W/ DIFFERENTIAL
BASOPHILS ABSOLUTE COUNT: 0.1 10*3/uL (ref 0.0–0.2)
BASOPHILS RELATIVE PERCENT: 2 %
EOSINOPHILS ABSOLUTE COUNT: 0.1 10*3/uL (ref 0.0–0.4)
EOSINOPHILS RELATIVE PERCENT: 3 %
HEMATOCRIT: 34.2 % (ref 34.0–46.6)
HEMOGLOBIN: 11.6 g/dL (ref 11.1–15.9)
LYMPHOCYTES RELATIVE PERCENT: 19 %
MEAN CORPUSCULAR HEMOGLOBIN CONC: 33.9 g/dL (ref 31.5–35.7)
MEAN CORPUSCULAR HEMOGLOBIN: 29.7 pg (ref 26.6–33.0)
MEAN CORPUSCULAR VOLUME: 88 fL (ref 79–97)
MONOCYTES ABSOLUTE COUNT: 0.4 10*3/uL (ref 0.1–0.9)
MONOCYTES RELATIVE PERCENT: 9 %
NEUTROPHILS ABSOLUTE COUNT: 3 10*3/uL (ref 1.4–7.0)
NEUTROPHILS RELATIVE PERCENT: 67 %
PLATELET COUNT: 237 10*3/uL (ref 150–450)
RED BLOOD CELL COUNT: 3.91 x10E6/uL (ref 3.77–5.28)
RED CELL DISTRIBUTION WIDTH: 12.7 % (ref 11.7–15.4)

## 2020-04-10 LAB — BK VIRUS QUANTITATIVE PCR, BLOOD: BK BLOOD RESULT: NOT DETECTED

## 2020-04-10 LAB — MEAN CORPUSCULAR HEMOGLOBIN CONC: Erythrocyte mean corpuscular hemoglobin concentration:MCnc:Pt:RBC:Qn:Automated count: 33.9

## 2020-04-10 LAB — CHLORIDE: Chloride:SCnc:Pt:Ser/Plas:Qn:: 100

## 2020-04-10 LAB — MAGNESIUM: Magnesium:MCnc:Pt:Ser/Plas:Qn:: 2.1

## 2020-04-10 NOTE — Telephone Encounter (Signed)
Left detailed message about scheduling appt or getting her other MD's to prescribe.

## 2020-04-11 LAB — TACROLIMUS LEVEL: TACROLIMUS BLOOD: 10.8 ng/mL (ref 2.0–20.0)

## 2020-04-12 LAB — CMV DNA, QUANTITATIVE, PCR: CMV QUANT: POSITIVE [IU]/mL

## 2020-04-12 MED ORDER — CLONAZEPAM 0.5 MG PO TABS: ORAL_TABLET | ORAL | 0 refills | Status: DC

## 2020-04-12 NOTE — Telephone Encounter (Signed)
Heather Sims called back.  She has not had success with her primary or kidney doctors willing to write for the clonazepam.  She has an appt with her neurologist in February who writes for her seizure medication. She believes they will manage it for her, but until then she needs a one month supply. She has made an appt with Dr Naaman Plummer 10/23/20 for yearly visit. We will call in one month supply only for her clonazepam. She agrees.

## 2020-04-12 NOTE — Addendum Note (Signed)
Addended by: Caro Hight on: 04/12/2020 02:47 PM   Modules accepted: Orders

## 2020-04-15 DIAGNOSIS — Z940 Kidney transplant status: Principal | ICD-10-CM

## 2020-04-16 NOTE — Unmapped (Signed)
[facility] Clinical Assessment & Refill Coordination Note    Carolyn Kelley, DOB: Dec 18, 1990  Phone: [phone] (home)     All above HIPAA information was verified with patient.     Was a Nurse, learning disability used for this call? No    Specialty Medication(s):   Transplant: Envarsus 1mg , Envarsus 4mg ,  mycophenolic acid 180mg  and Prednisone 5mg      Current Outpatient Medications   Medication Sig Dispense Refill   ??? acetaminophen (TYLENOL) 500 MG tablet Take 1-2 tablets (500-1,000 mg total) by mouth every six (6) hours as needed for pain or fever (> 38C). 100 tablet 0   ??? aspirin (ECOTRIN) 81 MG tablet Take 1 tablet (81 mg total) by mouth daily. (Patient not taking: Reported on 03/29/2020) 30 tablet 11   ??? cholecalciferol, vitamin D3-125 mcg, 5,000 unit,, 125 mcg (5,000 unit) capsule Take 1 capsule (125 mcg total) by mouth daily. 100 capsule 11   ??? clonazePAM (KLONOPIN) 0.5 MG tablet Take 1 tablet (0.5 mg total) by mouth two (2) times a day. 60 tablet 3   ??? levETIRAcetam (KEPPRA) 250 MG tablet TAKE 1 TABLET BY MOUTH TWICE DAILY. 180 tablet 3   ??? magnesium oxide-Mg AA chelate (MAGNESIUM, AMINO ACID CHELATE,) 133 mg Tab Take 2 tablets by mouth Two (2) times a day. 120 tablet 11   ??? mycophenolate (MYFORTIC) 180 MG EC tablet Take 4 tablets (720 mg total) by mouth Two (2) times a day. Adjust dose per medication card. 240 tablet 11   ??? predniSONE (DELTASONE) 5 MG tablet Take 1 tablet (5 mg total) by mouth daily. 30 tablet 11   ??? sodium bicarbonate 650 mg tablet Take 2 tablets (1,300 mg total) by mouth Two (2) times a day. 120 tablet 11   ??? tacrolimus (ENVARSUS XR) 1 mg Tb24 extended release tablet Take three (1mg ) tablets with one (4mg ) tablet by mouth daily for a total daily dose of 7 mg. 90 tablet 11   ??? tacrolimus (ENVARSUS XR) 4 mg Tb24 extended release tablet Take one (4mg ) tablet with three (1mg ) tablets by mouth daily for a total daily dose of 7 mg. 30 tablet 11     No current facility-administered medications for this visit.        Changes to medications: Avelyn reports no changes at this time.    Allergies   Allergen Reactions   ??? Bee Pollen Swelling     Sneezing, congestion  denies swelling.  Sneezing, congestion  denies swelling.     ??? Pollen Extracts Swelling       Changes to allergies: No    SPECIALTY MEDICATION ADHERENCE     envarsus 1mg   : 12 days of medicine on hand   envarsus 4mg   : 12 days of medicine on hand   mycophenolate 180mg   : 12 days of medicine on hand   Prednisone 5mg  : 12 days of medicine on hand       Medication Adherence    Patient reported X missed doses in the last month: 0  Specialty Medication: envarsus 1mg   Patient is on additional specialty medications: Yes  Additional Specialty Medications: envarsus 4mg   Patient Reported Additional Medication X Missed Doses in the Last Month: 0  Patient is on more than two specialty medications: Yes  Specialty Medication: mycophenolate 180mg   Patient Reported Additional Medication X Missed Doses in the Last Month: 0  Specialty Medication: prednisone 5mg   Patient Reported Additional Medication X Missed Doses in the Last  Month: 0          Specialty medication(s) dose(s) confirmed: Regimen is correct and unchanged.     Are there any concerns with adherence? No    Adherence counseling provided? Not needed    CLINICAL MANAGEMENT AND INTERVENTION      Clinical Benefit Assessment:    Do you feel the medicine is effective or helping your condition? Yes    Clinical Benefit counseling provided? Not needed    Adverse Effects Assessment:    Are you experiencing any side effects? No    Are you experiencing difficulty administering your medicine? No    Quality of Life Assessment:    How many days over the past month did your transplant  keep you from your normal activities? For example, brushing your teeth or getting up in the morning. 0    Have you discussed this with your provider? Not needed    Therapy Appropriateness:    Is therapy appropriate? Yes, therapy is appropriate and should be continued    DISEASE/MEDICATION-SPECIFIC INFORMATION      N/A    PATIENT SPECIFIC NEEDS     - Does the patient have any physical, cognitive, or cultural barriers? No    - Is the patient high risk? Yes, patient is taking a REMS drug. Medication is dispensed in compliance with REMS program    - Does the patient require a Care Management Plan? No     - Does the patient require physician intervention or other additional services (i.e. nutrition, smoking cessation, social work)? No      SHIPPING     Specialty Medication(s) to be Shipped:   Transplant: Envarsus 1mg , Envarsus 4mg ,  mycophenolic acid 180mg  and Prednisone 5mg     Other medication(s) to be shipped: mgplus, sodium bicarb     Changes to insurance: No    Delivery Scheduled: Yes, Expected medication delivery date: 04/24/2020.     Medication will be delivered via UPS to the confirmed prescription address in [facility].    The patient will receive a drug information handout for each medication shipped and additional FDA Medication Guides as required.  Verified that patient has previously received a Conservation officer, historic buildings.    All of the patient's questions and concerns have been addressed.    Thad Ranger   [facility] Specialty Pharmacist

## 2020-04-22 DIAGNOSIS — Z940 Kidney transplant status: Principal | ICD-10-CM

## 2020-04-23 MED FILL — MYCOPHENOLATE SODIUM 180 MG TABLET,DELAYED RELEASE: ORAL | 30 days supply | Qty: 240 | Fill #4

## 2020-04-23 MED FILL — MG-PLUS-PROTEIN 133 MG TABLET: ORAL | 30 days supply | Qty: 120 | Fill #1

## 2020-04-23 MED FILL — ENVARSUS XR 4 MG TABLET,EXTENDED RELEASE: ORAL | 30 days supply | Qty: 30 | Fill #1

## 2020-04-23 MED FILL — SODIUM BICARBONATE 650 MG TABLET: ORAL | 30 days supply | Qty: 120 | Fill #3

## 2020-04-23 MED FILL — ENVARSUS XR 1 MG TABLET,EXTENDED RELEASE: ORAL | 30 days supply | Qty: 90 | Fill #1

## 2020-04-23 MED FILL — PREDNISONE 5 MG TABLET: ORAL | 30 days supply | Qty: 30 | Fill #3

## 2020-04-24 LAB — CBC W/ DIFFERENTIAL
BASOPHILS ABSOLUTE COUNT: 0.1 10*3/uL (ref 0.0–0.2)
BASOPHILS RELATIVE PERCENT: 2 %
EOSINOPHILS ABSOLUTE COUNT: 0.1 10*3/uL (ref 0.0–0.4)
EOSINOPHILS RELATIVE PERCENT: 4 %
HEMATOCRIT: 34.3 % (ref 34.0–46.6)
HEMOGLOBIN: 11.1 g/dL (ref 11.1–15.9)
LYMPHOCYTES ABSOLUTE COUNT: 0.7 10*3/uL (ref 0.7–3.1)
LYMPHOCYTES RELATIVE PERCENT: 18 %
MEAN CORPUSCULAR HEMOGLOBIN CONC: 32.4 g/dL (ref 31.5–35.7)
MEAN CORPUSCULAR HEMOGLOBIN: 29.9 pg (ref 26.6–33.0)
MEAN CORPUSCULAR VOLUME: 93 fL (ref 79–97)
MONOCYTES ABSOLUTE COUNT: 0.3 10*3/uL (ref 0.1–0.9)
MONOCYTES RELATIVE PERCENT: 8 %
NEUTROPHILS ABSOLUTE COUNT: 2.5 10*3/uL (ref 1.4–7.0)
NEUTROPHILS RELATIVE PERCENT: 68 %
PLATELET COUNT: 211 10*3/uL (ref 150–450)
RED BLOOD CELL COUNT: 3.71 x10E6/uL — ABNORMAL LOW (ref 3.77–5.28)
RED CELL DISTRIBUTION WIDTH: 13.3 % (ref 11.7–15.4)
WHITE BLOOD CELL COUNT: 3.7 10*3/uL (ref 3.4–10.8)

## 2020-04-24 LAB — BASIC METABOLIC PANEL
BLOOD UREA NITROGEN: 26 mg/dL — ABNORMAL HIGH (ref 6–20)
BUN / CREAT RATIO: 22 (ref 9–23)
CALCIUM: 10 mg/dL (ref 8.7–10.2)
CHLORIDE: 106 mmol/L (ref 96–106)
CO2: 19 mmol/L — ABNORMAL LOW (ref 20–29)
CREATININE: 1.2 mg/dL — ABNORMAL HIGH (ref 0.57–1.00)
GFR MDRD AF AMER: 70 mL/min/{1.73_m2}
GFR MDRD NON AF AMER: 61 mL/min/{1.73_m2}
GLUCOSE: 95 mg/dL (ref 65–99)
POTASSIUM: 5.3 mmol/L — ABNORMAL HIGH (ref 3.5–5.2)
SODIUM: 140 mmol/L (ref 134–144)

## 2020-04-24 LAB — PHOSPHORUS: PHOSPHORUS, SERUM: 3.7 mg/dL (ref 3.0–4.3)

## 2020-04-24 LAB — MAGNESIUM: MAGNESIUM: 1.8 mg/dL (ref 1.6–2.3)

## 2020-04-26 ENCOUNTER — Ambulatory Visit: Admit: 2020-04-26 | Discharge: 2020-04-27 | Payer: PRIVATE HEALTH INSURANCE

## 2020-04-26 DIAGNOSIS — N2889 Other specified disorders of kidney and ureter: Principal | ICD-10-CM

## 2020-04-26 DIAGNOSIS — Z940 Kidney transplant status: Principal | ICD-10-CM

## 2020-04-26 LAB — CMV DNA, QUANTITATIVE, PCR: CMV QUANT: NEGATIVE [IU]/mL

## 2020-04-26 LAB — TACROLIMUS LEVEL: TACROLIMUS BLOOD: 7.2 ng/mL (ref 2.0–20.0)

## 2020-04-29 DIAGNOSIS — Z940 Kidney transplant status: Principal | ICD-10-CM

## 2020-05-06 DIAGNOSIS — Z940 Kidney transplant status: Principal | ICD-10-CM

## 2020-05-07 DIAGNOSIS — Z940 Kidney transplant status: Principal | ICD-10-CM

## 2020-05-07 NOTE — Unmapped (Signed)
Patient called to say that she forgot to tell us that she went to [facility] and they did not see antigen order. Order placed again.     States she went to [facility] and was told she cannot have another covid booster. Explained to patient that she has not had a booster yet but has completed her 3 shot series and now needs a booster or a 4th shot as a transplant patient. She will go back next week    Reports she is doing well and denies any needs

## 2020-05-08 LAB — CBC W/ DIFFERENTIAL
BANDED NEUTROPHILS ABSOLUTE COUNT: 0.2 10*3/uL — ABNORMAL HIGH (ref 0.0–0.1)
BASOPHILS ABSOLUTE COUNT: 0.1 10*3/uL (ref 0.0–0.2)
BASOPHILS RELATIVE PERCENT: 1 %
EOSINOPHILS ABSOLUTE COUNT: 0.2 10*3/uL (ref 0.0–0.4)
EOSINOPHILS RELATIVE PERCENT: 3 %
HEMATOCRIT: 35 % (ref 34.0–46.6)
HEMOGLOBIN: 11.3 g/dL (ref 11.1–15.9)
IMMATURE GRANULOCYTES: 3 %
LYMPHOCYTES ABSOLUTE COUNT: 0.8 10*3/uL (ref 0.7–3.1)
LYMPHOCYTES RELATIVE PERCENT: 16 %
MEAN CORPUSCULAR HEMOGLOBIN CONC: 32.3 g/dL (ref 31.5–35.7)
MEAN CORPUSCULAR HEMOGLOBIN: 29.4 pg (ref 26.6–33.0)
MEAN CORPUSCULAR VOLUME: 91 fL (ref 79–97)
MONOCYTES ABSOLUTE COUNT: 0.5 10*3/uL (ref 0.1–0.9)
MONOCYTES RELATIVE PERCENT: 11 %
NEUTROPHILS ABSOLUTE COUNT: 3.3 10*3/uL (ref 1.4–7.0)
NEUTROPHILS RELATIVE PERCENT: 66 %
PLATELET COUNT: 235 10*3/uL (ref 150–450)
RED BLOOD CELL COUNT: 3.84 x10E6/uL (ref 3.77–5.28)
RED CELL DISTRIBUTION WIDTH: 13 % (ref 11.7–15.4)
WHITE BLOOD CELL COUNT: 5.1 10*3/uL (ref 3.4–10.8)

## 2020-05-08 LAB — BASIC METABOLIC PANEL
BLOOD UREA NITROGEN: 34 mg/dL — ABNORMAL HIGH (ref 6–20)
BUN / CREAT RATIO: 31 — ABNORMAL HIGH (ref 9–23)
CALCIUM: 10.3 mg/dL — ABNORMAL HIGH (ref 8.7–10.2)
CHLORIDE: 104 mmol/L (ref 96–106)
CO2: 19 mmol/L — ABNORMAL LOW (ref 20–29)
CREATININE: 1.1 mg/dL — ABNORMAL HIGH (ref 0.57–1.00)
GFR MDRD AF AMER: 78 mL/min/{1.73_m2}
GFR MDRD NON AF AMER: 68 mL/min/{1.73_m2}
GLUCOSE: 88 mg/dL (ref 65–99)
POTASSIUM: 4.5 mmol/L (ref 3.5–5.2)
SODIUM: 137 mmol/L (ref 134–144)

## 2020-05-08 LAB — MAGNESIUM: MAGNESIUM: 1.9 mg/dL (ref 1.6–2.3)

## 2020-05-08 LAB — PHOSPHORUS: PHOSPHORUS, SERUM: 4.3 mg/dL (ref 3.0–4.3)

## 2020-05-10 LAB — TACROLIMUS LEVEL: TACROLIMUS BLOOD: 5.3 ng/mL (ref 2.0–20.0)

## 2020-05-10 LAB — CMV DNA, QUANTITATIVE, PCR: CMV QUANT: POSITIVE [IU]/mL

## 2020-05-15 NOTE — Unmapped (Signed)
[facility] Refill Coordination Note    Specialty Medication(s) to be Shipped:   Transplant: Envarsus 1mg , Envarsus 4mg ,  mycophenolic acid 180mg  and Prednisone 5mg     Other medication(s) to be shipped: mag 133mg , sodium bicarb     Carolyn Kelley, DOB: [birth year]  Phone: [phone] (home)       All above HIPAA information was verified with patient.     Was a Nurse, learning disability used for this call? No    Completed refill call assessment today to schedule patient's medication shipment from the [facility] [phone]).       Specialty medication(s) and dose(s) confirmed: Regimen is correct and unchanged.   Changes to medications: [name] reports no changes at this time.  Changes to insurance: No  Questions for the pharmacist: No    Confirmed patient received Welcome Packet with first shipment. The patient will receive a drug information handout for each medication shipped and additional FDA Medication Guides as required.       DISEASE/MEDICATION-SPECIFIC INFORMATION        N/A    SPECIALTY MEDICATION ADHERENCE     Medication Adherence    Patient reported X missed doses in the last month: 0                 envarsus 1 mg: 8 days of medicine on hand   envarsus 4 mg: 8 days of medicine on hand   mycophenolate 180 mg: 8 days of medicine on hand   Prednisone 5mg : 8 days worth of medication on hand.       SHIPPING     Shipping address confirmed in Epic.     Delivery Scheduled: Yes, Expected medication delivery date: 05/21/20.     Medication will be delivered via UPS to the prescription address in [facility].    Carolyn Kelley   [facility] Specialty Technician

## 2020-05-17 ENCOUNTER — Telehealth: Payer: Self-pay

## 2020-05-17 DIAGNOSIS — G253 Myoclonus: Secondary | ICD-10-CM

## 2020-05-17 MED ORDER — CLONAZEPAM 0.5 MG PO TABS: ORAL_TABLET | ORAL | 0 refills | Status: DC

## 2020-05-17 NOTE — Telephone Encounter (Signed)
done

## 2020-05-20 DIAGNOSIS — Z940 Kidney transplant status: Principal | ICD-10-CM

## 2020-05-20 MED FILL — ENVARSUS XR 4 MG TABLET,EXTENDED RELEASE: ORAL | 30 days supply | Qty: 30 | Fill #2

## 2020-05-20 MED FILL — MG-PLUS-PROTEIN 133 MG TABLET: ORAL | 30 days supply | Qty: 120 | Fill #2

## 2020-05-20 MED FILL — ENVARSUS XR 1 MG TABLET,EXTENDED RELEASE: ORAL | 30 days supply | Qty: 90 | Fill #2

## 2020-05-20 MED FILL — PREDNISONE 5 MG TABLET: ORAL | 30 days supply | Qty: 30 | Fill #4

## 2020-05-20 MED FILL — SODIUM BICARBONATE 650 MG TABLET: ORAL | 30 days supply | Qty: 120 | Fill #4

## 2020-05-20 MED FILL — MYCOPHENOLATE SODIUM 180 MG TABLET,DELAYED RELEASE: ORAL | 30 days supply | Qty: 240 | Fill #5

## 2020-05-22 LAB — CBC W/ DIFFERENTIAL
BANDED NEUTROPHILS ABSOLUTE COUNT: 0.2 10*3/uL — ABNORMAL HIGH (ref 0.0–0.1)
BASOPHILS ABSOLUTE COUNT: 0 10*3/uL (ref 0.0–0.2)
BASOPHILS RELATIVE PERCENT: 1 %
EOSINOPHILS ABSOLUTE COUNT: 0.1 10*3/uL (ref 0.0–0.4)
EOSINOPHILS RELATIVE PERCENT: 3 %
HEMATOCRIT: 34.7 % (ref 34.0–46.6)
HEMOGLOBIN: 11.2 g/dL (ref 11.1–15.9)
IMMATURE GRANULOCYTES: 5 %
LYMPHOCYTES ABSOLUTE COUNT: 0.9 10*3/uL (ref 0.7–3.1)
LYMPHOCYTES RELATIVE PERCENT: 23 %
MEAN CORPUSCULAR HEMOGLOBIN CONC: 32.3 g/dL (ref 31.5–35.7)
MEAN CORPUSCULAR HEMOGLOBIN: 29.2 pg (ref 26.6–33.0)
MEAN CORPUSCULAR VOLUME: 90 fL (ref 79–97)
MONOCYTES ABSOLUTE COUNT: 0.3 10*3/uL (ref 0.1–0.9)
MONOCYTES RELATIVE PERCENT: 9 %
NEUTROPHILS ABSOLUTE COUNT: 2.3 10*3/uL (ref 1.4–7.0)
NEUTROPHILS RELATIVE PERCENT: 59 %
PLATELET COUNT: 210 10*3/uL (ref 150–450)
RED BLOOD CELL COUNT: 3.84 x10E6/uL (ref 3.77–5.28)
RED CELL DISTRIBUTION WIDTH: 13.1 % (ref 11.7–15.4)
WHITE BLOOD CELL COUNT: 3.8 10*3/uL (ref 3.4–10.8)

## 2020-05-22 LAB — BASIC METABOLIC PANEL
BLOOD UREA NITROGEN: 27 mg/dL — ABNORMAL HIGH (ref 6–20)
BUN / CREAT RATIO: 24 — ABNORMAL HIGH (ref 9–23)
CALCIUM: 10 mg/dL (ref 8.7–10.2)
CHLORIDE: 107 mmol/L — ABNORMAL HIGH (ref 96–106)
CO2: 17 mmol/L — ABNORMAL LOW (ref 20–29)
CREATININE: 1.14 mg/dL — ABNORMAL HIGH (ref 0.57–1.00)
EGFR: 66 mL/min/{1.73_m2}
GLUCOSE: 92 mg/dL (ref 65–99)
POTASSIUM: 4.5 mmol/L (ref 3.5–5.2)
SODIUM: 139 mmol/L (ref 134–144)

## 2020-05-22 LAB — MAGNESIUM: MAGNESIUM: 1.6 mg/dL (ref 1.6–2.3)

## 2020-05-22 LAB — PHOSPHORUS: PHOSPHORUS, SERUM: 3.9 mg/dL (ref 3.0–4.3)

## 2020-05-22 LAB — COVID SPIKE IGG
COVID-19 IGG: <0.8 U/mL
SARS-COV-2 SPIKE AB INTERP: NEGATIVE

## 2020-05-22 NOTE — Progress Notes (Signed)
PATIENT: Heather Sims DOB: [birth year]  REASON FOR VISIT: follow up HISTORY FROM: patient  HISTORY OF PRESENT ILLNESS: Today 05/23/20  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  Reportedly her seizures occurred during time of severe illness related to her ESRD, around 2014.  She received a kidney transplant in December 2020.  She is on Keppra 250 mg twice daily.  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 Requests we continue refilling this medication going forward.  She is using a walker, but learning to use a cane.  She wishes to try to come off the [place].  She is learning to drive.  She lives with her mom.  Here today for evaluation unaccompanied.  Update 05/24/2019 SS:[name] is a 30 year old female with history of prior cardiac arrest, and anoxia of the brain with subsequent issues with gait instability, and seizures.  She is taking Keppra 250 mg twice daily and is tolerating the medication well.  She has not had recurrent seizure.  She received a kidney transplant in December 2020.  She is back in school, finishing her degree in psychology.  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  Her rejection medications have possible side effect of tremor.  She has a walker for ambulation. She is learning to drive.  She wonders if she may be able to come off Keppra at some point she presents today for follow-up unaccompanied.  HISTORY 05/23/2018 SS: [name] is a 30 year old female who presents for yearly follow-up with history of seizures.  She is on hemodialysis with end-stage renal disease.  She is currently taking Keppra 250 mg twice daily and is tolerating medication well.  She reports her last seizure was about 5 years ago.  She is currently on the kidney  transplant list at [facility].  She does not drive a car but she reports she is learning.  She lives with her mom who brought her to this appointment today.  She denies any new problems or concerns.  She is using a walker today.  She reports she handles her own medications.  She presents today for follow-up.   REVIEW OF SYSTEMS: Out of a complete 14 system review of symptoms, the patient complains only of the following symptoms, and all other reviewed systems are negative.  Tremor, seizure  ALLERGIES: Allergies  Allergen Reactions  . Pollen Extract     Sneezing, congestion denies swelling.    HOME MEDICATIONS: Outpatient Medications Prior to Visit  Medication Sig Dispense Refill  . acetaminophen (TYLENOL) 325 MG tablet Take 2 tablets (650 mg total) by mouth every 4 (four) hours as needed (pain).    . Cholecalciferol 125 MCG (5000 UT) capsule Take by mouth.    . clonazePAM (KLONOPIN) 0.5 MG tablet Take 1 tablet (0.5 mg total) by mouth two (2) times a day. 14 tablet 0  . mycophenolate (MYFORTIC) 180 MG EC tablet Take by mouth.    . predniSONE (DELTASONE) 5 MG tablet Take 5 mg by mouth daily.    . sodium bicarbonate 650 MG tablet Take by mouth.    Marland Kitchen Specialty Vitamins Products (MAGNESIUM, AMINO ACID CHELATE,) 133 MG tablet Take 2 tablets by mouth in the morning and at bedtime.    . Tacrolimus ER 1  MG TB24 Take by mouth.    . levETIRAcetam (KEPPRA) 250 MG tablet TAKE 1 TABLET BY MOUTH TWICE DAILY. 180 tablet 3  . aspirin 81 MG EC tablet Take 81 mg by mouth daily.    . Cinacalcet HCl (SENSIPAR PO) Take 60 mg by mouth 2 (two) times daily.     Marland Kitchen OVER THE COUNTER MEDICATION Take by mouth daily. Phosphorous    . sevelamer carbonate (RENVELA) 800 MG tablet Take 800 mg by mouth 3 (three) times daily with meals.     No facility-administered medications prior to visit.    PAST MEDICAL HISTORY: Past Medical History:  Diagnosis Date  . Anemia   . Brain injury ([place])   . Cardiac arrest  ([place]) 03/26/2011  . Cardiomyopathy   . CHF (congestive heart failure) ([place])   . Chronic kidney disease    [facility]- [place]  . Depression   . Family history of adverse reaction to anesthesia    Mother hard to wake up.  . Gait disorder 01/11/2013  . Heart failure   . History of blood transfusion 11/2011  . Hypertension   . Ileitis   . MPGN (membranoproliferative glomerulonephritis), type 2   . Pneumonia    'walking pneumonia'  . Renal disorder   . Seizures ([place])    2013- last time    PAST SURGICAL HISTORY: Past Surgical History:  Procedure Laterality Date  . AV FISTULA PLACEMENT  12/18/2011   Procedure: ARTERIOVENOUS (AV) FISTULA CREATION;  Surgeon: Angelia Mould, MD;  Location: [facility];  Service: Vascular;  Laterality: Left;  . INSERTION OF DIALYSIS CATHETER Right 08/06/2014   Procedure: INSERTION OF DIALYSIS CATHETER;  Surgeon: Elam Dutch, MD;  Location: [facility];  Service: Vascular;  Laterality: Right;  . RENAL BIOPSY    . REVISON OF ARTERIOVENOUS FISTULA Left 0/24/0973   Procedure: PLICATION OF ARTERIOVENOUS FISTULA;  Surgeon: Elam Dutch, MD;  Location: [facility];  Service: Vascular;  Laterality: Left;  . REVISON OF ARTERIOVENOUS FISTULA Left 03/12/2017   Procedure: REVISION PLICATION OF ARTERIOVENOUS FISTULA LEFT UPPER ARM;  Surgeon: Serafina Mitchell, MD;  Location: [facility];  Service: Vascular;  Laterality: Left;    FAMILY HISTORY: Family History  Problem Relation Age of Onset  . Hypertension Mother   . Diabetes Father   . Hyperlipidemia Father     SOCIAL HISTORY: Social History   Socioeconomic History  . Marital status: Single    Spouse name: Not on file  . Number of children: 0  . Years of education: college jr  . Highest education level: Not on file  Occupational History  . Not on file  Tobacco Use  . Smoking status: Never Smoker  . Smokeless tobacco: Never Used  Vaping Use  . Vaping Use: Never used  Substance and Sexual Activity  . Alcohol use:  No    Alcohol/week: 0.0 standard drinks  . Drug use: No  . Sexual activity: Never    Birth control/protection: Abstinence  Other Topics Concern  . Not on file  Social History Narrative   Lives at home with her mother & brother   Right handed      ** Merged History Encounter **       Social Determinants of Health   Financial Resource Strain: Not on file  Food Insecurity: Not on file  Transportation Needs: Not on file  Physical Activity: Not on file  Stress: Not on file  Social Connections: Not on file  Intimate Partner  Violence: Not on file   PHYSICAL EXAM  Vitals:   05/23/20 0941  BP: 111/75  Pulse: 84  Weight: 176 lb (79.8 kg)  Height: 5\' 2"  (1.575 m)   Body mass index is 32.19 kg/m.  Generalized: Well developed, in no acute distress  Neurological examination  Mentation: Alert oriented to time, place, history taking. Follows all commands speech and language fluent Cranial nerve II-XII: Pupils were equal round reactive to light. Extraocular movements were full, visual field were full on confrontational test. Facial sensation and strength were normal. Head turning and shoulder shrug were normal and symmetric. Motor: Good strength of all extremities, mild increased spasticity in lower extremities. Sensory: Sensory testing is intact to soft touch on all 4 extremities. No evidence of extinction is noted.  Coordination: Cerebellar testing reveals good finger-nose-finger and heel-to-shin bilaterally. No tremor noted. Gait and station: Gait is wide-based, unsteady, with a walker, gait is steady. Reflexes: Deep tendon reflexes are symmetric but increased   DIAGNOSTIC DATA (LABS, IMAGING, TESTING) - I reviewed patient records, labs, notes, testing and imaging myself where available.  Lab Results  Component Value Date   WBC 6.6 12/18/2011   HGB 13.3 03/12/2017   HCT 39.0 03/12/2017   MCV 90.9 12/18/2011   PLT 244 12/18/2011      Component Value Date/Time   NA 137  03/12/2017 1024   K 4.8 03/12/2017 1024   CL 99 12/18/2011 0505   CO2 27 12/18/2011 0505   GLUCOSE 80 03/12/2017 1024   BUN 17 12/18/2011 0505   CREATININE 2.30 (H) 12/18/2011 0505   CALCIUM 9.4 12/18/2011 0505   CALCIUM 8.4 10/28/2011 1327   PROT 6.1 11/29/2011 0224   ALBUMIN 2.4 (L) 12/18/2011 0505   AST 56 (H) 11/29/2011 0224   ALT 6 12/01/2011 2330   ALKPHOS 62 11/29/2011 0224   BILITOT 0.4 11/29/2011 0224   GFRNONAA 29 (L) 12/18/2011 0505   GFRAA 34 (L) 12/18/2011 0505   Lab Results  Component Value Date   CHOL 296 (H) 03/26/2011   HDL 54 03/26/2011   LDLCALC 213 (H) 03/26/2011   TRIG 146 03/26/2011   CHOLHDL 5.5 03/26/2011   No results found for: HGBA1C Lab Results  Component Value Date   VITAMINB12 1,354 (H) 11/28/2011   Lab Results  Component Value Date   TSH 7.226 (H) 09/12/2011   ASSESSMENT AND PLAN 30 y.o. year old female  has a past medical history of Anemia, Brain injury ([place]), Cardiac arrest ([place]) (03/26/2011), Cardiomyopathy, CHF (congestive heart failure) ([place]), Chronic kidney disease, Depression, Family history of adverse reaction to anesthesia, Gait disorder (01/11/2013), Heart failure, History of blood transfusion (11/2011), Hypertension, Ileitis, MPGN (membranoproliferative glomerulonephritis), type 2, Pneumonia, Renal disorder, and Seizures ([place]). here with:  1.  Seizures 2.  Anoxic Brain Injury  -Her last seizure occurred in 2013 or 2014, during a time of severe illness and cardiac arrest with anoxic brain injury related to ESRD, she wishes to try to come off the [place], had kidney transplant in 2020  -This may be reasonable, but beforehand we will check EEG, will discuss with Dr. Jannifer Franklin before we decide anything  -For now, continue Keppra 250 mg twice daily  -She has a follow-up appointment with Dr. 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  This included previsit chart review, lab review, study review, order entry, electronic health record documentation, patient education.  Butler Denmark, AGNP-C, DNP 05/23/2020, 10:44 AM [facility] [address] [place], [place] [address] [phone]

## 2020-05-23 ENCOUNTER — Telehealth: Payer: Self-pay

## 2020-05-23 ENCOUNTER — Ambulatory Visit (INDEPENDENT_AMBULATORY_CARE_PROVIDER_SITE_OTHER): Payer: Medicaid Other | Admitting: Neurology

## 2020-05-23 ENCOUNTER — Encounter: Payer: Self-pay | Admitting: Neurology

## 2020-05-23 VITALS — BP 111/75 | HR 84 | Ht 62.0 in | Wt 176.0 lb

## 2020-05-23 DIAGNOSIS — G40409 Other generalized epilepsy and epileptic syndromes, not intractable, without status epilepticus: Secondary | ICD-10-CM

## 2020-05-23 DIAGNOSIS — G931 Anoxic brain damage, not elsewhere classified: Secondary | ICD-10-CM

## 2020-05-23 DIAGNOSIS — G253 Myoclonus: Secondary | ICD-10-CM

## 2020-05-23 LAB — TACROLIMUS LEVEL: TACROLIMUS BLOOD: 7.7 ng/mL (ref 2.0–20.0)

## 2020-05-23 MED ORDER — LEVETIRACETAM 250 MG PO TABS: ORAL_TABLET | ORAL | 3 refills | Status: DC

## 2020-05-23 MED ORDER — CLONAZEPAM 0.5 MG PO TABS: ORAL_TABLET | ORAL | 1 refills | Status: DC

## 2020-05-23 MED ORDER — CLONAZEPAM 0.5 MG TABLET
Freq: Two times a day (BID) | ORAL | 0.00000 days
Start: 2020-05-23 — End: ?

## 2020-05-23 MED ORDER — LEVETIRACETAM 250 MG TABLET
ORAL_TABLET | Freq: Two times a day (BID) | ORAL | 3 refills | 0.00000 days
Start: 2020-05-23 — End: ?

## 2020-05-23 NOTE — Progress Notes (Signed)
I have read the note, and I agree with the clinical assessment and plan.  Charles K Willis   

## 2020-05-23 NOTE — Telephone Encounter (Signed)
Med RF'ed

## 2020-05-23 NOTE — Patient Instructions (Signed)
Check EEG today  Continue Keppra for now Keep appointment with Dr. Naaman Plummer See you back in 1 year

## 2020-05-23 NOTE — Telephone Encounter (Signed)
Heather Sims was seen by Heather Denmark NP at [facility] today.   Heather Sims will not take over the Clonazepam O.5 MG right now. She would like Heather Sims to follow up with you first. To see is she needs to continue the Rx.  Then Heather Roch  NP will consider prescribing.   Next appointment with [facility] is on 05/29/2021 Next appointment here at [facility] 07/23/2020  Heather Sims only has a 7 day supply on hand. If granted, please send Rx to [facility].   Thank you.

## 2020-05-28 NOTE — Unmapped (Signed)
Called patient to see if she got her 4th COVID vaccine since her spike protein was negative. She reports she got her 4th shot on 2.25.    Updated Dr Margaretmary Bayley

## 2020-06-03 DIAGNOSIS — Z940 Kidney transplant status: Principal | ICD-10-CM

## 2020-06-05 LAB — CBC W/ DIFFERENTIAL
BANDED NEUTROPHILS ABSOLUTE COUNT: 0.2 10*3/uL — ABNORMAL HIGH (ref 0.0–0.1)
BASOPHILS ABSOLUTE COUNT: 0 10*3/uL (ref 0.0–0.2)
BASOPHILS RELATIVE PERCENT: 1 %
EOSINOPHILS ABSOLUTE COUNT: 0.1 10*3/uL (ref 0.0–0.4)
EOSINOPHILS RELATIVE PERCENT: 3 %
HEMATOCRIT: 35.5 % (ref 34.0–46.6)
HEMOGLOBIN: 11.3 g/dL (ref 11.1–15.9)
IMMATURE GRANULOCYTES: 4 %
LYMPHOCYTES ABSOLUTE COUNT: 0.9 10*3/uL (ref 0.7–3.1)
LYMPHOCYTES RELATIVE PERCENT: 20 %
MEAN CORPUSCULAR HEMOGLOBIN CONC: 31.8 g/dL (ref 31.5–35.7)
MEAN CORPUSCULAR HEMOGLOBIN: 29 pg (ref 26.6–33.0)
MEAN CORPUSCULAR VOLUME: 91 fL (ref 79–97)
MONOCYTES ABSOLUTE COUNT: 0.5 10*3/uL (ref 0.1–0.9)
MONOCYTES RELATIVE PERCENT: 11 %
NEUTROPHILS ABSOLUTE COUNT: 2.9 10*3/uL (ref 1.4–7.0)
NEUTROPHILS RELATIVE PERCENT: 61 %
PLATELET COUNT: 224 10*3/uL (ref 150–450)
RED BLOOD CELL COUNT: 3.89 x10E6/uL (ref 3.77–5.28)
RED CELL DISTRIBUTION WIDTH: 13.2 % (ref 11.7–15.4)
WHITE BLOOD CELL COUNT: 4.7 10*3/uL (ref 3.4–10.8)

## 2020-06-05 LAB — BASIC METABOLIC PANEL
BLOOD UREA NITROGEN: 32 mg/dL — ABNORMAL HIGH (ref 6–20)
BUN / CREAT RATIO: 27 — ABNORMAL HIGH (ref 9–23)
CALCIUM: 9.7 mg/dL (ref 8.7–10.2)
CHLORIDE: 102 mmol/L (ref 96–106)
CO2: 21 mmol/L (ref 20–29)
CREATININE: 1.2 mg/dL — ABNORMAL HIGH (ref 0.57–1.00)
EGFR: 62 mL/min/{1.73_m2}
GLUCOSE: 88 mg/dL (ref 65–99)
POTASSIUM: 4.5 mmol/L (ref 3.5–5.2)
SODIUM: 138 mmol/L (ref 134–144)

## 2020-06-05 LAB — MAGNESIUM: MAGNESIUM: 2.3 mg/dL (ref 1.6–2.3)

## 2020-06-05 LAB — PHOSPHORUS: PHOSPHORUS, SERUM: 3.6 mg/dL (ref 3.0–4.3)

## 2020-06-06 LAB — TACROLIMUS LEVEL: TACROLIMUS BLOOD: 7.9 ng/mL (ref 2.0–20.0)

## 2020-06-10 NOTE — Unmapped (Signed)
[facility] Refill Coordination Note    Specialty Medication(s) to be Shipped:   Transplant: Envarsus 1mg , Envarsus 4mg , Myfortic 180mg  and Prednisone 5mg     Other medication(s) to be shipped: magnesium, keppra, sodium bicarbonate     Carolyn Kelley, DOB: Sep 13, 1990  Phone: [phone] (home)       All above HIPAA information was verified with patient.     Was a Nurse, learning disability used for this call? No    Completed refill call assessment today to schedule patient's medication shipment from the [facility] [phone]).       Specialty medication(s) and dose(s) confirmed: Regimen is correct and unchanged.   Changes to medications: Carolyn Kelley reports no changes at this time.  Changes to insurance: No  Questions for the pharmacist: No    Confirmed patient received Welcome Packet with first shipment. The patient will receive a drug information handout for each medication shipped and additional FDA Medication Guides as required.       DISEASE/MEDICATION-SPECIFIC INFORMATION        N/A    SPECIALTY MEDICATION ADHERENCE     Medication Adherence    Patient reported X missed doses in the last month: 0  Specialty Medication: Envarsus 1mg   Patient is on additional specialty medications: Yes  Additional Specialty Medications: Envarsus 4mg   Patient Reported Additional Medication X Missed Doses in the Last Month: 0  Patient is on more than two specialty medications: Yes  Specialty Medication: Mycophenolate 180mg   Patient Reported Additional Medication X Missed Doses in the Last Month: 0  Specialty Medication: Prednisone 5mg   Any gaps in refill history greater than 2 weeks in the last 3 months: no  Demonstrates understanding of importance of adherence: yes  Informant: patient                Envarsus 1mg : Patient has 10 days of medication on hand  Envarus 4 mg: Patient has 10 days of medication on hand  Myfortic 180mg : Patient has 10 days of medication on hand  Prednisone 5mg : Patient has 10 days of medication on hand      SHIPPING     Shipping address confirmed in Epic.     Delivery Scheduled: Yes, Expected medication delivery date: 3/29.     Medication will be delivered via UPS to the prescription address in Epic W[facility].    Carolyn Kelley   [facility] Specialty Technician

## 2020-06-17 DIAGNOSIS — Z940 Kidney transplant status: Principal | ICD-10-CM

## 2020-06-17 MED FILL — ENVARSUS XR 4 MG TABLET,EXTENDED RELEASE: ORAL | 30 days supply | Qty: 30 | Fill #3

## 2020-06-17 MED FILL — LEVETIRACETAM 250 MG TABLET: ORAL | 90 days supply | Qty: 180 | Fill #0

## 2020-06-17 MED FILL — MG-PLUS-PROTEIN 133 MG TABLET: ORAL | 30 days supply | Qty: 120 | Fill #3

## 2020-06-17 MED FILL — MYCOPHENOLATE SODIUM 180 MG TABLET,DELAYED RELEASE: ORAL | 30 days supply | Qty: 240 | Fill #6

## 2020-06-17 MED FILL — ENVARSUS XR 1 MG TABLET,EXTENDED RELEASE: ORAL | 30 days supply | Qty: 90 | Fill #3

## 2020-06-17 MED FILL — PREDNISONE 5 MG TABLET: ORAL | 30 days supply | Qty: 30 | Fill #5

## 2020-06-17 MED FILL — SODIUM BICARBONATE 650 MG TABLET: ORAL | 30 days supply | Qty: 120 | Fill #5

## 2020-06-19 LAB — BASIC METABOLIC PANEL
BLOOD UREA NITROGEN: 28 mg/dL — ABNORMAL HIGH (ref 6–20)
BUN / CREAT RATIO: 25 — ABNORMAL HIGH (ref 9–23)
CALCIUM: 9.9 mg/dL (ref 8.7–10.2)
CHLORIDE: 102 mmol/L (ref 96–106)
CO2: 21 mmol/L (ref 20–29)
CREATININE: 1.11 mg/dL — ABNORMAL HIGH (ref 0.57–1.00)
EGFR: 68 mL/min/{1.73_m2}
GLUCOSE: 87 mg/dL (ref 65–99)
POTASSIUM: 4.7 mmol/L (ref 3.5–5.2)
SODIUM: 137 mmol/L (ref 134–144)

## 2020-06-19 LAB — CBC W/ DIFFERENTIAL
BANDED NEUTROPHILS ABSOLUTE COUNT: 0.2 10*3/uL — ABNORMAL HIGH (ref 0.0–0.1)
BASOPHILS ABSOLUTE COUNT: 0 10*3/uL (ref 0.0–0.2)
BASOPHILS RELATIVE PERCENT: 1 %
EOSINOPHILS ABSOLUTE COUNT: 0.1 10*3/uL (ref 0.0–0.4)
EOSINOPHILS RELATIVE PERCENT: 2 %
HEMATOCRIT: 35.5 % (ref 34.0–46.6)
HEMOGLOBIN: 11.5 g/dL (ref 11.1–15.9)
IMMATURE GRANULOCYTES: 4 %
LYMPHOCYTES ABSOLUTE COUNT: 1 10*3/uL (ref 0.7–3.1)
LYMPHOCYTES RELATIVE PERCENT: 22 %
MEAN CORPUSCULAR HEMOGLOBIN CONC: 32.4 g/dL (ref 31.5–35.7)
MEAN CORPUSCULAR HEMOGLOBIN: 29.5 pg (ref 26.6–33.0)
MEAN CORPUSCULAR VOLUME: 91 fL (ref 79–97)
MONOCYTES ABSOLUTE COUNT: 0.4 10*3/uL (ref 0.1–0.9)
MONOCYTES RELATIVE PERCENT: 9 %
NEUTROPHILS ABSOLUTE COUNT: 2.8 10*3/uL (ref 1.4–7.0)
NEUTROPHILS RELATIVE PERCENT: 62 %
PLATELET COUNT: 226 10*3/uL (ref 150–450)
RED BLOOD CELL COUNT: 3.9 x10E6/uL (ref 3.77–5.28)
RED CELL DISTRIBUTION WIDTH: 13.1 % (ref 11.7–15.4)
WHITE BLOOD CELL COUNT: 4.5 10*3/uL (ref 3.4–10.8)

## 2020-06-19 LAB — MAGNESIUM: MAGNESIUM: 1.8 mg/dL (ref 1.6–2.3)

## 2020-06-19 LAB — PHOSPHORUS: PHOSPHORUS, SERUM: 4 mg/dL (ref 3.0–4.3)

## 2020-06-20 DIAGNOSIS — Z940 Kidney transplant status: Principal | ICD-10-CM

## 2020-06-21 LAB — TACROLIMUS LEVEL: TACROLIMUS BLOOD: 10.6 ng/mL (ref 2.0–20.0)

## 2020-06-21 NOTE — Unmapped (Signed)
Documented 4th covid vaccine

## 2020-06-24 ENCOUNTER — Ambulatory Visit: Payer: Medicaid Other | Admitting: Neurology

## 2020-06-24 DIAGNOSIS — R569 Unspecified convulsions: Secondary | ICD-10-CM

## 2020-06-24 DIAGNOSIS — G40409 Other generalized epilepsy and epileptic syndromes, not intractable, without status epilepticus: Secondary | ICD-10-CM

## 2020-06-26 ENCOUNTER — Telehealth: Payer: Self-pay | Admitting: Neurology

## 2020-06-26 NOTE — Telephone Encounter (Signed)
Recent EEG was normal.  EEG was done to consider discontinuation of Keppra.  Her last seizure occurred during time of severe illness and cardiac arrest with anoxic brain injury related to ESRD in 2013 or 14.  She had a kidney transplant in 2020.  She desires to try to come off Keppra.  Summary  Normal electroencephalogram, awake, asleep and with activation procedures. There are no focal lateralizing or epileptiform features.

## 2020-06-27 NOTE — Telephone Encounter (Signed)
Called and LMVM home and cell for pt or mother to return call.  [facility]) was on both phones.

## 2020-06-27 NOTE — Telephone Encounter (Signed)
Recent EEG was normal.  There was no seizure activity.  She desires to taper off Keppra.  She is currently taking Keppra 250 mg twice daily. Have her take 1 tablet daily for 2 weeks then stop.  If she has any preseizure feeling she should let me know right away.

## 2020-06-28 ENCOUNTER — Ambulatory Visit
Admit: 2020-06-28 | Discharge: 2020-06-29 | Payer: PRIVATE HEALTH INSURANCE | Attending: Nephrology | Primary: Nephrology

## 2020-06-28 DIAGNOSIS — Z940 Kidney transplant status: Principal | ICD-10-CM

## 2020-06-28 DIAGNOSIS — N058 Unspecified nephritic syndrome with other morphologic changes: Principal | ICD-10-CM

## 2020-06-28 DIAGNOSIS — L709 Acne, unspecified: Principal | ICD-10-CM

## 2020-06-28 DIAGNOSIS — Z79899 Other long term (current) drug therapy: Principal | ICD-10-CM

## 2020-06-28 DIAGNOSIS — D849 Immunodeficiency, unspecified: Principal | ICD-10-CM

## 2020-06-28 DIAGNOSIS — T827XXS Infection and inflammatory reaction due to other cardiac and vascular devices, implants and grafts, sequela: Principal | ICD-10-CM

## 2020-06-28 DIAGNOSIS — G40309 Generalized idiopathic epilepsy and epileptic syndromes, not intractable, without status epilepticus: Principal | ICD-10-CM

## 2020-06-28 LAB — CBC W/ AUTO DIFF
BASOPHILS ABSOLUTE COUNT: 0 10*9/L (ref 0.0–0.1)
BASOPHILS RELATIVE PERCENT: 0.5 %
EOSINOPHILS ABSOLUTE COUNT: 0.1 10*9/L (ref 0.0–0.5)
EOSINOPHILS RELATIVE PERCENT: 2.9 %
HEMATOCRIT: 33.8 % — ABNORMAL LOW (ref 34.0–44.0)
HEMOGLOBIN: 11.3 g/dL (ref 11.3–14.9)
LYMPHOCYTES ABSOLUTE COUNT: 0.9 10*9/L — ABNORMAL LOW (ref 1.1–3.6)
LYMPHOCYTES RELATIVE PERCENT: 20.9 %
MEAN CORPUSCULAR HEMOGLOBIN CONC: 33.4 g/dL (ref 32.0–36.0)
MEAN CORPUSCULAR HEMOGLOBIN: 29.5 pg (ref 25.9–32.4)
MEAN CORPUSCULAR VOLUME: 88.3 fL (ref 77.6–95.7)
MEAN PLATELET VOLUME: 7.6 fL (ref 6.8–10.7)
MONOCYTES ABSOLUTE COUNT: 0.4 10*9/L (ref 0.3–0.8)
MONOCYTES RELATIVE PERCENT: 10.2 %
NEUTROPHILS ABSOLUTE COUNT: 2.7 10*9/L (ref 1.8–7.8)
NEUTROPHILS RELATIVE PERCENT: 65.5 %
NUCLEATED RED BLOOD CELLS: 0 /100{WBCs} (ref <=4)
PLATELET COUNT: 220 10*9/L (ref 150–450)
RED BLOOD CELL COUNT: 3.83 10*12/L — ABNORMAL LOW (ref 3.95–5.13)
RED CELL DISTRIBUTION WIDTH: 14.2 % (ref 12.2–15.2)
WBC ADJUSTED: 4.1 10*9/L (ref 3.6–11.2)

## 2020-06-28 LAB — PROTEIN / CREATININE RATIO, URINE
CREATININE, URINE: 191.9 mg/dL
PROTEIN URINE: 23.7 mg/dL
PROTEIN/CREAT RATIO, URINE: 0.124

## 2020-06-28 LAB — COVID SPIKE IGG
SARS-COV-2 IGG SEMI-QUANT: 2.04 [AU]/ml (ref <13.00)
SARS-COV-2 SPIKE IGG ANTIBODY: NEGATIVE

## 2020-06-28 LAB — BASIC METABOLIC PANEL
ANION GAP: 4 mmol/L — ABNORMAL LOW (ref 5–14)
BLOOD UREA NITROGEN: 30 mg/dL — ABNORMAL HIGH (ref 9–23)
BUN / CREAT RATIO: 27
CALCIUM: 10.8 mg/dL — ABNORMAL HIGH (ref 8.7–10.4)
CHLORIDE: 107 mmol/L (ref 98–107)
CO2: 26.8 mmol/L (ref 20.0–31.0)
CREATININE: 1.13 mg/dL — ABNORMAL HIGH
EGFR CKD-EPI AA FEMALE: 75 mL/min/{1.73_m2} (ref >=60)
EGFR CKD-EPI NON-AA FEMALE: 65 mL/min/{1.73_m2} (ref >=60)
GLUCOSE RANDOM: 94 mg/dL (ref 70–179)
POTASSIUM: 4.6 mmol/L (ref 3.4–4.8)
SODIUM: 138 mmol/L (ref 135–145)

## 2020-06-28 LAB — URINALYSIS
BILIRUBIN UA: NEGATIVE
GLUCOSE UA: NEGATIVE
KETONES UA: NEGATIVE
LEUKOCYTE ESTERASE UA: NEGATIVE
NITRITE UA: NEGATIVE
PH UA: 5.5 (ref 5.0–9.0)
PROTEIN UA: NEGATIVE
RBC UA: 1 /HPF (ref <4)
SPECIFIC GRAVITY UA: 1.025 (ref 1.005–1.030)
SQUAMOUS EPITHELIAL: 23 /HPF — ABNORMAL HIGH (ref 0–5)
UROBILINOGEN UA: 0.2
WBC UA: 2 /HPF (ref 0–5)

## 2020-06-28 LAB — SLIDE REVIEW

## 2020-06-28 LAB — MAGNESIUM: MAGNESIUM: 1.9 mg/dL (ref 1.6–2.6)

## 2020-06-28 LAB — PHOSPHORUS: PHOSPHORUS: 3.3 mg/dL (ref 2.4–5.1)

## 2020-06-28 NOTE — Unmapped (Addendum)
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 She is seen in follow up of her transplant and to address other medical problems. 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 Genetic panel has not yet been performed.    - Treatment included increase in the dose of Myfortic to 720 mg bid and initiation of prednisone 5 mg daily  - Serum creatinine has remained stable at <1.3 mg/dl (6.44 mg/dL today)  - UPC has normalized (0.124 today) with peak value of 0.966 on 03/29/20 and UA with minimal hematuria    - Rituximab was deferred at the time of diagnosis due to an active COVID-19 community spike and concern about infection risk.    - Will prepare for use of Eculizumab in case current therapy or rituximab proves ineffective in the future. This will include completion of pre-eculizumab vaccination     - Repeat C3, C4, C3 glomerulopathy functional panel  - Order genetics panel  - Continue current immunosuppression for now    Immunosuppression Management [High Risk Medical Decision Making For Drug Therapy Requiring Intensive Monitoring For Toxicity]:   - Tacrolimus level 7.6 (target 6-10 ng/mL).   - Envarsus 7 mg daily dose to be continued   - Continue Myfortic 720 mg bid due to C3 glomerulopathy recurrence  - Prednisone 5 mg daily due to C3 glomerulopathy recurrence, steroid free prior to C3G diagnosis    Infectious Prophylaxis and Monitoring: CMV & EBV: D+/R+  - PJP prophylaxis: completed (s/p Bactrim x 6 months)  - CMV prophylaxis: completed (s/p Valcyte x3 months).   - COVID-19 vaccination: completed 4 doses (dose #4 05/17/20). She is aware of the limited efficacy of this vaccine in transplant recipients. Will plan a booster (#5) after after 09/14/20.    - Evusheld if SARS-CoV-2 IgG is negative today.  - Received Men B on 01/26/20 and 03/01/20  - Received MenACWY 01/26/20, with second dose to be given at next visit.   - Booster doses of meningococcal vaccine every 1-5 years per CDC recommendations if chronic eculizumab therapy if is initiated.    Seizures Disorder, no seizures since 2014:    - EEG with no evidence of seizures on 06/25/20 .   - Keppra discontinuation being considered by Neurology team.  - She will follow up with [name], NP at [facility].     Myoclonus:   Will continue Klonopin, prescribed by PCP.  Saw [facility] physical therapy.  Continues to use walker.     Metabolic Acidosis and Hyperkalemia, resolved with therapy  CO2 is now normal (26.8) and K is 4.6  Sodium bicarbonate 1300 mg bid to be continued.    Pseudoaneurysm of AVF  She saw Dr. Norma Fredrickson 09/21/19 with plan to defer any intervention until 1 year has passed from transplant. She has no high output heart failure by echo or symptoms of CHF. She will be referred back to Dr. Norma Fredrickson. We would favor a nonurgent revision to minimize any risk of rupture, but spare the AVF for future use if possible to do so safely. Patient has been advised to contact us  with any scabbing or erosion of the skin over the pseudoaneurysm.    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  She will follow up with GYN.     Follow up  3 months    History of Present Illness    Carolyn Kelley is a 30 y.o. 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 Her post-operative course was complicated by hypotension and hypoxia thought to be caused by anaphylactic reaction to Campath. She has experienced no episodes of rejection or infectious complications. Due to presence of proteinuria she underwent kidney biopsy on 12/29/19. The biopsy showed C3 glomerulopathy recurrence. In response to this diagnosis Myfortic was increased from 540 mg bid to 720 mg bid and she was started on prednisone 5 mg daily while tacrolimus was continued.    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 She is scheduled for GYN follow up in 6 months.     She presents today without complaints. She is studying psychology and this takes much of her time. She denies chest pain, shortness of breath, edema, fever, chills, cough, tremors, headache, dysuria, or allograft tenderness. She has experienced no seizures and had a recent EEG revealing no seizure activity. BP has been normal.     Transplant History:  Date of Transplant: 03/10/19  Organ Received: Left DDKT, DBD, SCD, KDPI 2%; cold ischemia 18 hrs.  Native Kidney Disease: C3 glomerulonephropathy  Pre-transplant CPRA:  0%  Post-Transplant Course: Complicated by anaphylactic reaction to Campath in OR  Induction: Campath  Date of Ureteral Stent Removal: 04/17/19  CMV/EBV Status: CMV D+/R+, EBV D+/R+  Rejection Episodes: None  Donor Specific Antibodies: Negative (most recent 01/26/20)   Biopsy 12/29/19: C3 glomerulopathy recurrence, no rejection    Other Past Medical History  1. C3 Glomerulopathy  2. Seizure Disorder  3. 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 HTN  5. 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 ADHD    Review of Systems    Otherwise as per HPI, all other systems reviewed and are negative.    Medications  Current Outpatient Medications   Medication Sig Dispense Refill   ??? acetaminophen (TYLENOL) 500 MG tablet Take 1-2 tablets (500-1,000 mg total) by mouth every six (6) hours as needed for pain or fever (> 38C). 100 tablet 0   ??? cholecalciferol, vitamin D3-125 mcg, 5,000 unit,, 125 mcg (5,000 unit) capsule Take 1 capsule (125 mcg total) by mouth daily. 100 capsule 11   ??? clonazePAM (KLONOPIN) 0.5 MG tablet Take 1 tablet (0.5 mg total) by mouth two (2) times a day. 60 tablet 3   ??? levETIRAcetam (KEPPRA) 250 MG tablet TAKE 1 TABLET BY MOUTH TWICE DAILY. 180 tablet 3   ??? magnesium oxide-Mg AA chelate (MAGNESIUM, AMINO ACID CHELATE,) 133 mg Tab Take 2 tablets by mouth Two (2) times a day. 120 tablet 11   ??? mycophenolate (MYFORTIC) 180 MG EC tablet Take 4 tablets (720 mg total) by mouth Two (2) times a day. Adjust dose per medication card. 240 tablet 11   ??? predniSONE (DELTASONE) 5 MG tablet Take 1 tablet (5 mg total) by mouth daily. 30 tablet 11   ??? sodium bicarbonate 650  mg tablet Take 2 tablets (1,300 mg total) by mouth Two (2) times a day. 120 tablet 11   ??? tacrolimus (ENVARSUS XR) 1 mg Tb24 extended release tablet Take three (1mg ) tablets with one (4mg ) tablet by mouth daily for a total daily dose of 7 mg. 90 tablet 11   ??? tacrolimus (ENVARSUS XR) 4 mg Tb24 extended release tablet Take one (4mg ) tablet with three (1mg ) tablets by mouth daily for a total daily dose of 7 mg. 30 tablet 11   ??? aspirin (ECOTRIN) 81 MG tablet Take 1 tablet (81 mg total) by mouth daily. (Patient not taking: Reported on 03/29/2020) 30 tablet 11     No current facility-administered medications for this visit.       Physical Exam  BP 115/81 (BP Site: R Arm, BP Position: Sitting, BP Cuff Size: Medium)  - Pulse 88  - Temp 36 ??C (96.8 ??F) (Temporal)  - Wt 79.1 kg (174 lb 6.4 oz)  - BMI 31.90 kg/m??    General: Patient is a pleasant female in no apparent distress.  Eyes: Sclera anicteric.  Neck: Supple without LAD/JVD/bruits.  Lungs: Clear to auscultation bilaterally, no wheezes/rales/rhonchi.  Cardiovascular: Regular rate and rhythm without murmurs, rubs or gallops.  Abdomen: Soft, notender/nondistended. Positive bowel sounds. No hepatosplenomegaly, masses or bruits appreciated.  Extremities: Without edema, joints without evidence of synovitis; pseudoaneurysm of AVF LUE  Skin: Without rash  Neurological: Grossly nonfocal; uses a walker for ambulation due to myoclonus  Psychiatric: Mood and affect appropriate.      Laboratory Results:  Recent Results (from the past 170 hour(s))   COVID Spike IgG    Collection Time: 06/28/20 10:44 AM   Result Value Ref Range    SARS-COV-2 IgG Semi-Quant 2.04 <13.00 AU/mL    SARS-CoV-2 SPIKE IgG ANTIBODY Negative Negative   Magnesium Level    Collection Time: 06/28/20 10:44 AM   Result Value Ref Range    Magnesium 1.9 1.6 - 2.6 mg/dL   Phosphorus Level    Collection Time: 06/28/20 10:44 AM   Result Value Ref Range    Phosphorus 3.3 2.4 - 5.1 mg/dL   Tacrolimus, Trough   [facility])    Collection Time: 06/28/20 10:44 AM   Result Value Ref Range    Tacrolimus, Trough 7.6 5.0 - 15.0 ng/mL   Basic Metabolic Panel    Collection Time: 06/28/20 10:44 AM   Result Value Ref Range    Sodium 138 135 - 145 mmol/L    Potassium 4.6 3.4 - 4.8 mmol/L    Chloride 107 98 - 107 mmol/L    CO2 26.8 20.0 - 31.0 mmol/L    Anion Gap 4 (L) 5 - 14 mmol/L    BUN 30 (H) 9 - 23 mg/dL    Creatinine 1.61 (H) 0.60 - 0.80 mg/dL    BUN/Creatinine Ratio 27     EGFR CKD-EPI Non-African American, Female 65 >=60 mL/min/1.28m2    EGFR CKD-EPI African American, Female 75 >=60 mL/min/1.59m2    Glucose 94 70 - 179 mg/dL    Calcium 09.6 (H) 8.7 - 10.4 mg/dL   CBC w/ Differential    Collection Time: 06/28/20 10:44 AM   Result Value Ref Range    WBC 4.1 3.6 - 11.2 10*9/L    RBC 3.83 (L) 3.95 - 5.13 10*12/L    HGB 11.3 11.3 - 14.9 g/dL    HCT 04.5 (L) 40.9 - 44.0 %    MCV 88.3 77.6 - 95.7 fL  MCH 29.5 25.9 - 32.4 pg    MCHC 33.4 32.0 - 36.0 g/dL    RDW 16.1 09.6 - 04.5 %    MPV 7.6 6.8 - 10.7 fL    Platelet 220 150 - 450 10*9/L    nRBC 0 <=4 /100 WBCs    Neutrophils % 65.5 %    Lymphocytes % 20.9 %    Monocytes % 10.2 %    Eosinophils % 2.9 %    Basophils % 0.5 %    Absolute Neutrophils 2.7 1.8 - 7.8 10*9/L    Absolute Lymphocytes 0.9 (L) 1.1 - 3.6 10*9/L    Absolute Monocytes 0.4 0.3 - 0.8 10*9/L    Absolute Eosinophils 0.1 0.0 - 0.5 10*9/L    Absolute Basophils 0.0 0.0 - 0.1 10*9/L   Morphology Review    Collection Time: 06/28/20 10:44 AM   Result Value Ref Range    Smear Review Comments See Comment (A) Undefined   CMV DNA, quantitative, PCR    Collection Time: 06/28/20 10:46 AM   Result Value Ref Range    CMV Viral Ld Not Detected Not Detected    CMV Quant      CMV Quant Log10     Urinalysis    Collection Time: 06/28/20 12:06 PM   Result Value Ref Range    Color, UA Yellow     Clarity, UA Clear     Specific Gravity, UA 1.025 1.005 - 1.030    pH, UA 5.5 5.0 - 9.0    Leukocyte Esterase, UA Negative Negative    Nitrite, UA Negative Negative    Protein, UA Negative Negative    Glucose, UA Negative Negative    Ketones, UA Negative Negative    Urobilinogen, UA 0.2 mg/dL 0.2 - 2.0 mg/dL    Bilirubin, UA Negative Negative    Blood, UA Small (A) Negative    RBC, UA 1 <4 /HPF    WBC, UA 2 0 - 5 /HPF    Squam Epithel, UA 23 (H) 0 - 5 /HPF    Yeast, UA Rare (A) None Seen /HPF    Bacteria, UA Moderate (A) None Seen /HPF    Amorphous Crystal, UA Few /HPF   Culture, Urine    Collection Time: 06/28/20 12:06 PM    Specimen: Clean Catch; Urine   Result Value Ref Range    Urine Culture, Comprehensive Mixed Urogenital Flora    Protein/Creatinine Ratio, Urine    Collection Time: 06/28/20 12:06 PM   Result Value Ref Range    Creat U 191.9 Undefined mg/dL    Protein, Ur 40.9 mg/dL    Protein/Creatinine Ratio, Urine 0.124 Undefined   Cytology - Urine    Collection Time: 06/28/20 12:06 PM   Result Value Ref Range    Diagnosis       Urine, voided  -No malignant cells identified  -No decoy cells identified  -Predominantly squamous cells    This electronic signature is attestation that the pathologist personally reviewed the submitted material(s) and the final diagnosis reflects that evaluation.      Gross Description            Urine clean catch voided decoy    3mL clear, yellow fluid, unfixed    Materials Prepared & Examined    Smear Slides.......0  Monolayers..........1  Cytospins.............0  Cell Blocks...........0  Core Biopsy.........0  Touch Prep..........0          Microscopic Description       Microscopic examination substantiates  the above diagnosis.    Resident Physician: None Assigned      EMBEDDED IMAGES      Specimen Adequacy Satisfactory for evaluation     Disclaimer       Unless otherwise specified, specimens are preserved using 10% neutral buffered formalin. For cases in which immunohistochemical and/or in-situ hybridization stains are performed, the following statement applies: Appropriate controls for each stain (positive controls with or without negative controls) have been evaluated and stain as expected. These stains have not been separately validated for use on decalcified specimens and should be interpreted with caution in that setting. Some of the reagents used for these stains may be classified as analyte specific reagents (ASR). Tests using ASRs were developed, and their performance characteristics were determined, by the Anatomic Pathology Department [facility]). They have not been cleared or approved by the [place] Food and Drug Administration (FDA). The FDA does not require these tests to go through premarket FDA review. These tests are used for clinical purposes. They should not be regarded as investigational or for research. This laboratory is certified under the Clinical Laboratory Improvement Amendments (CLIA) as qualified to perform high complexity clinical laboratory testing.

## 2020-06-28 NOTE — Unmapped (Signed)
AOBP: Right        arm        Medium       cuff     Average :   115/81             Pulse:88    1st reading: 120/78            Pulse:86    2nd reading:111/81            Pulse:90    3rd reading:  114/84           Pulse: 89

## 2020-06-28 NOTE — Unmapped (Signed)
Assessment    Met w/ patient in [facility] today. Reviewed meds/symptoms. Any new medications? no                Pt reports no fever/cold/flu symptoms    BP: 115/81 today/ Home BP reported wnl   BG: wnl   Headache/Dizziness/Lightheaded: denies   Hand tremors: minor   Numbness/tingling: denies   Fevers/chills/sweats: denies   CP/SOB/palpatations: denies   Nausea/vomiting/heartburn: denies   Diarrhea/constipation: denies   UTI symptoms (burn/pain/itch/frequency/urgency/odor/color/foam): denies; urgency when otherwise occupied   No visible or palpable edema    Appetite good - eating healthier choices, increased vegetables; reports adequate hydration. 50-60 oz/bottles/fluid per day.    Pt reports being well rested and getting adequate exercise despite Covid 19 quarantine. Taking care to mask, hand hygeine and minimal public activity. Offered support and guidance for this process given her immune suppressed state. Also discussed reduced covid vaccine coverage for transplant patients and importance of continuing to mask and practice safe distancing. Commented that a booster vaccine may be advised in the near future.    Last Envarsus (7mg ) taken 11:00 4/7; held for this morning's labs.    No other complaints or concerns.     Referrals needed: Derm    Pt Follow up w/ dentist in May    Immunization status: Covid x4, flu, pneumovax UTD.       Functional Score: 90     Able to carry on normal activity;  Minor signs or symptoms of disease.     Employment/work status: full time student

## 2020-06-29 LAB — CMV DNA, QUANTITATIVE, PCR: CMV VIRAL LD: NOT DETECTED

## 2020-06-29 LAB — TACROLIMUS LEVEL, TROUGH: TACROLIMUS, TROUGH: 7.6 ng/mL (ref 5.0–15.0)

## 2020-07-01 DIAGNOSIS — Z940 Kidney transplant status: Principal | ICD-10-CM

## 2020-07-01 NOTE — Telephone Encounter (Signed)
LMVM for pt on mothers phone. That have message re: medication for Heather Sims. (2nd message).

## 2020-07-01 NOTE — Unmapped (Signed)
Reviewed referral and prior notes. Will ask Transplant Clinic front desk to contact for appointment with Dr. Norma Fredrickson. Focus of care will be to preserve fistula since nephrology ststes transplanted kidney may be in decline.

## 2020-07-02 NOTE — Unmapped (Signed)
Voicemail message left for patient to call clinic schedule an appointment with Dr. Norma Fredrickson for banding AVF

## 2020-07-05 ENCOUNTER — Ambulatory Visit
Admit: 2020-07-05 | Discharge: 2020-07-06 | Payer: PRIVATE HEALTH INSURANCE | Attending: Dermatology | Primary: Dermatology

## 2020-07-05 DIAGNOSIS — Z79899 Other long term (current) drug therapy: Principal | ICD-10-CM

## 2020-07-05 DIAGNOSIS — L709 Acne, unspecified: Principal | ICD-10-CM

## 2020-07-05 DIAGNOSIS — Z940 Kidney transplant status: Principal | ICD-10-CM

## 2020-07-05 DIAGNOSIS — N058 Unspecified nephritic syndrome with other morphologic changes: Principal | ICD-10-CM

## 2020-07-05 DIAGNOSIS — G40309 Generalized idiopathic epilepsy and epileptic syndromes, not intractable, without status epilepticus: Principal | ICD-10-CM

## 2020-07-05 DIAGNOSIS — L700 Acne vulgaris: Principal | ICD-10-CM

## 2020-07-05 DIAGNOSIS — D849 Immunodeficiency, unspecified: Principal | ICD-10-CM

## 2020-07-05 MED ORDER — TRETINOIN 0.025 % TOPICAL CREAM
3 refills | 0.00000 days | Status: CP
Start: 2020-07-05 — End: 2020-07-05

## 2020-07-05 NOTE — Unmapped (Addendum)
-   Start tretinoin cream. Apply nightly. If this cream dries out your skin, I recommend you decrease use to 3x/week.   - Apply moisturize after tretinoin dries.  - Start an over the counter benzoyl peroxide wash 3-5% (brand name includes Neutragena Clear Pore, CereVe Acne Foaming Cream Cleanser, Differin Cleanser) that you use in the shower. Can bleach linens (clothes, towels, etc), will NOT bleach your skin or hair). Dry off with a white towel so it doesn't take the color out of clothing and colored towels.

## 2020-07-05 NOTE — Unmapped (Unsigned)
Dermatology Note     Assessment and Plan:      Adult female acne, hx of immunosupression  - Patient had a kidney transplant in 02/2019 and is currently taking myfortic, prednisone 5 mg daily, and envarsus. Discussed that medications such as envarsus and prednisone can trigger acne to flare.  - Discussed treatment options with patient. Mutual decision made to initiate the following:  - Start tretinoin (RETIN-A) 0.025 % cream; Start every other night and increase to nightly as tolerated  - Start benzoyl peroxide wash qday to face, chest and back; SER (dryness and bleaching of linens/clothes)  - Use gentle wash at other time of day to face  - Discussed takes 3 months to assess response to plan above and encouraged compliance, instructed to call if cannot tolerate medications above    The patient was advised to call for an appointment should any new, changing, or symptomatic lesions develop.     RTC: Return in about 10 weeks (around 09/13/2020) for follow-up. or sooner as needed   _________________________________________________________________      Chief Complaint     Chief Complaint   Patient presents with   ??? Acne     referral for acne, breakouts usually around jawline, past few weeks        HPI     Carolyn Kelley is a 30 y.o. female who presents as a patient who is seen in consultation by Louann Liv, MD at the request of Randal Ewing Schlein, MD to [facility] for evaluation of acne.     Patient notes her acne is currently flared. She reports some resolve with time but overall, she is unhappy with present activity. Prior to beginning treatment with dialysis, patient never had any issues with her acne. Currently washing face with cetaphil wash.     The patient denies any other new or changing lesions or areas of concern.     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     ROS: Other than symptoms mentioned in the HPI, no fevers, chills, or other skin complaints    Physical Examination     GENERAL: Well-appearing female in no acute distress, resting comfortably.  NEURO: Alert and oriented, answers questions appropriately  PSYCH: Normal mood and affect  SKIN: Examination of the face was performed    - papules and nodule son lower half of face    All areas not commented on are within normal limits or unremarkable    Scribe's Attestation: Jacquelin Hawking, MD obtained and performed the history, physical exam and medical decision making elements that were entered into the chart. Signed by Verner Chol, Scribe, on July 05, 2020 8:57 AM.    (Approved Template 12/04/2019)

## 2020-07-09 MED ORDER — ASPIRIN 81 MG TABLET,DELAYED RELEASE
ORAL_TABLET | Freq: Every day | ORAL | 11 refills | 30 days | Status: CP
Start: 2020-07-09 — End: 2021-07-09
  Filled 2020-07-18: qty 30, 30d supply, fill #0

## 2020-07-09 NOTE — Unmapped (Signed)
Pt request for RX Refill aspirin (ECOTRIN) 81 MG tablet

## 2020-07-09 NOTE — Unmapped (Signed)
[facility] Refill Coordination Note    Specialty Medication(s) to be Shipped:   Transplant: Envarsus 1mg , Envarsus 4mg , mycophenolate mofetil 180mg  and Prednisone 5mg     Other medication(s) to be shipped: magnesium, sodium bicarb and aspirin     Carolyn Kelley, DOB: 01/02/1991  Phone: [phone] (home)       All above HIPAA information was verified with patient.     Was a Nurse, learning disability used for this call? No    Completed refill call assessment today to schedule patient's medication shipment from the [facility] [phone]).  All relevant notes have been reviewed.     Specialty medication(s) and dose(s) confirmed: Regimen is correct and unchanged.   Changes to medications: Carolyn Kelley reports no changes at this time.  Changes to insurance: No  New side effects reported not previously addressed with a pharmacist or physician: None reported  Questions for the pharmacist: No    Confirmed patient received a Conservation officer, historic buildings and a Surveyor, mining with first shipment. The patient will receive a drug information handout for each medication shipped and additional FDA Medication Guides as required.       DISEASE/MEDICATION-SPECIFIC INFORMATION        N/A    SPECIALTY MEDICATION ADHERENCE     Medication Adherence    Patient reported X missed doses in the last month: 0  Specialty Medication: Envarsus 1mg   Patient is on additional specialty medications: Yes  Additional Specialty Medications: Envarsus 4mg   Patient Reported Additional Medication X Missed Doses in the Last Month: 0  Patient is on more than two specialty medications: Yes  Specialty Medication: Mycophenolate 180mg   Patient Reported Additional Medication X Missed Doses in the Last Month: 0  Specialty Medication: Prednisone 5mg   Patient Reported Additional Medication X Missed Doses in the Last Month: 0        Were doses missed due to medication being on hold? No    Envarsus 1 mg: 12 days of medicine on hand   Envarsus 4 mg: 12 days of medicine on hand   Mycophenolate 180 mg: 12 days of medicine on hand   Prednisone 5 mg: 12 days of medicine on hand     REFERRAL TO PHARMACIST     Referral to the pharmacist: Not needed      SHIPPING     Shipping address confirmed in Epic.     Delivery Scheduled: Yes, Expected medication delivery date: 07/19/2020.     Medication will be delivered via UPS to the prescription address in Epic WAM.    [name] [facility] Specialty Technician

## 2020-07-12 DIAGNOSIS — N058 Unspecified nephritic syndrome with other morphologic changes: Principal | ICD-10-CM

## 2020-07-15 DIAGNOSIS — Z940 Kidney transplant status: Principal | ICD-10-CM

## 2020-07-17 LAB — CBC W/ DIFFERENTIAL
BASOPHILS ABSOLUTE COUNT: 0 10*3/uL (ref 0.0–0.2)
BASOPHILS RELATIVE PERCENT: 1 %
EOSINOPHILS ABSOLUTE COUNT: 0.1 10*3/uL (ref 0.0–0.4)
EOSINOPHILS RELATIVE PERCENT: 3 %
HEMATOCRIT: 34.4 % (ref 34.0–46.6)
HEMOGLOBIN: 11.2 g/dL (ref 11.1–15.9)
LYMPHOCYTES ABSOLUTE COUNT: 0.9 10*3/uL (ref 0.7–3.1)
LYMPHOCYTES RELATIVE PERCENT: 20 %
MEAN CORPUSCULAR HEMOGLOBIN CONC: 32.6 g/dL (ref 31.5–35.7)
MEAN CORPUSCULAR HEMOGLOBIN: 29.5 pg (ref 26.6–33.0)
MEAN CORPUSCULAR VOLUME: 91 fL (ref 79–97)
MONOCYTES ABSOLUTE COUNT: 0.2 10*3/uL (ref 0.1–0.9)
MONOCYTES RELATIVE PERCENT: 5 %
NEUTROPHILS ABSOLUTE COUNT: 3.2 10*3/uL (ref 1.4–7.0)
NEUTROPHILS RELATIVE PERCENT: 70 %
PLATELET COUNT: 217 10*3/uL (ref 150–450)
RED BLOOD CELL COUNT: 3.8 x10E6/uL (ref 3.77–5.28)
RED CELL DISTRIBUTION WIDTH: 13.6 % (ref 11.7–15.4)
WHITE BLOOD CELL COUNT: 4.6 10*3/uL (ref 3.4–10.8)

## 2020-07-17 LAB — BASIC METABOLIC PANEL
BLOOD UREA NITROGEN: 31 mg/dL — ABNORMAL HIGH (ref 6–20)
BUN / CREAT RATIO: 24 — ABNORMAL HIGH (ref 9–23)
CALCIUM: 10 mg/dL (ref 8.7–10.2)
CHLORIDE: 105 mmol/L (ref 96–106)
CO2: 20 mmol/L (ref 20–29)
CREATININE: 1.29 mg/dL — ABNORMAL HIGH (ref 0.57–1.00)
EGFR: 57 mL/min/{1.73_m2} — ABNORMAL LOW
GLUCOSE: 92 mg/dL (ref 65–99)
POTASSIUM: 4.9 mmol/L (ref 3.5–5.2)
SODIUM: 137 mmol/L (ref 134–144)

## 2020-07-17 LAB — MAGNESIUM: MAGNESIUM: 2.2 mg/dL (ref 1.6–2.3)

## 2020-07-17 LAB — PHOSPHORUS: PHOSPHORUS, SERUM: 4.2 mg/dL (ref 3.0–4.3)

## 2020-07-17 LAB — IMMATURE CELLS: METAMYLOCYTES-LABCORP: 1 % — ABNORMAL HIGH (ref 0–0)

## 2020-07-17 NOTE — Unmapped (Signed)
Called patient to schedule an appointment with Dr. Norma Fredrickson for banding AVF. Patient has decided not to make an appointment or care at this time. Carolyn Kelley was notified through e-mail of the patient decision.

## 2020-07-18 MED ORDER — CHOLECALCIFEROL (VITAMIN D3) 125 MCG (5,000 UNIT) CAPSULE
ORAL_CAPSULE | Freq: Every day | ORAL | 11 refills | 100 days | Status: CP
Start: 2020-07-18 — End: ?
  Filled 2020-07-18: qty 100, 100d supply, fill #0

## 2020-07-18 MED FILL — ENVARSUS XR 4 MG TABLET,EXTENDED RELEASE: ORAL | 30 days supply | Qty: 30 | Fill #4

## 2020-07-18 MED FILL — SODIUM BICARBONATE 650 MG TABLET: ORAL | 30 days supply | Qty: 120 | Fill #6

## 2020-07-18 MED FILL — PREDNISONE 5 MG TABLET: ORAL | 30 days supply | Qty: 30 | Fill #6

## 2020-07-18 MED FILL — ENVARSUS XR 1 MG TABLET,EXTENDED RELEASE: ORAL | 30 days supply | Qty: 90 | Fill #4

## 2020-07-18 MED FILL — MYCOPHENOLATE SODIUM 180 MG TABLET,DELAYED RELEASE: ORAL | 30 days supply | Qty: 240 | Fill #7

## 2020-07-18 MED FILL — MG-PLUS-PROTEIN 133 MG TABLET: ORAL | 30 days supply | Qty: 120 | Fill #4

## 2020-07-19 LAB — TACROLIMUS LEVEL: TACROLIMUS BLOOD: 8.4 ng/mL (ref 2.0–20.0)

## 2020-07-27 ENCOUNTER — Other Ambulatory Visit: Payer: Self-pay | Admitting: Physical Medicine & Rehabilitation

## 2020-07-27 DIAGNOSIS — G253 Myoclonus: Secondary | ICD-10-CM

## 2020-07-29 DIAGNOSIS — Z940 Kidney transplant status: Principal | ICD-10-CM

## 2020-07-31 LAB — CBC W/ DIFFERENTIAL
BANDED NEUTROPHILS ABSOLUTE COUNT: 0.2 10*3/uL — ABNORMAL HIGH (ref 0.0–0.1)
BASOPHILS ABSOLUTE COUNT: 0 10*3/uL (ref 0.0–0.2)
BASOPHILS RELATIVE PERCENT: 1 %
EOSINOPHILS ABSOLUTE COUNT: 0.1 10*3/uL (ref 0.0–0.4)
EOSINOPHILS RELATIVE PERCENT: 3 %
HEMATOCRIT: 34.4 % (ref 34.0–46.6)
HEMOGLOBIN: 10.9 g/dL — ABNORMAL LOW (ref 11.1–15.9)
IMMATURE GRANULOCYTES: 5 %
LYMPHOCYTES ABSOLUTE COUNT: 1.1 10*3/uL (ref 0.7–3.1)
LYMPHOCYTES RELATIVE PERCENT: 23 %
MEAN CORPUSCULAR HEMOGLOBIN CONC: 31.7 g/dL (ref 31.5–35.7)
MEAN CORPUSCULAR HEMOGLOBIN: 29.5 pg (ref 26.6–33.0)
MEAN CORPUSCULAR VOLUME: 93 fL (ref 79–97)
MONOCYTES ABSOLUTE COUNT: 0.5 10*3/uL (ref 0.1–0.9)
MONOCYTES RELATIVE PERCENT: 10 %
NEUTROPHILS ABSOLUTE COUNT: 2.7 10*3/uL (ref 1.4–7.0)
NEUTROPHILS RELATIVE PERCENT: 58 %
PLATELET COUNT: 232 10*3/uL (ref 150–450)
RED BLOOD CELL COUNT: 3.7 x10E6/uL — ABNORMAL LOW (ref 3.77–5.28)
RED CELL DISTRIBUTION WIDTH: 13.4 % (ref 11.7–15.4)
WHITE BLOOD CELL COUNT: 4.6 10*3/uL (ref 3.4–10.8)

## 2020-07-31 LAB — BASIC METABOLIC PANEL
BLOOD UREA NITROGEN: 26 mg/dL — ABNORMAL HIGH (ref 6–20)
BUN / CREAT RATIO: 23 (ref 9–23)
CALCIUM: 9.9 mg/dL (ref 8.7–10.2)
CHLORIDE: 102 mmol/L (ref 96–106)
CO2: 21 mmol/L (ref 20–29)
CREATININE: 1.12 mg/dL — ABNORMAL HIGH (ref 0.57–1.00)
EGFR: 67 mL/min/{1.73_m2}
GLUCOSE: 94 mg/dL (ref 65–99)
POTASSIUM: 4.2 mmol/L (ref 3.5–5.2)
SODIUM: 135 mmol/L (ref 134–144)

## 2020-07-31 LAB — MAGNESIUM: MAGNESIUM: 2.1 mg/dL (ref 1.6–2.3)

## 2020-07-31 LAB — PHOSPHORUS: PHOSPHORUS, SERUM: 3 mg/dL (ref 3.0–4.3)

## 2020-08-02 LAB — TACROLIMUS LEVEL: TACROLIMUS BLOOD: 7.2 ng/mL (ref 2.0–20.0)

## 2020-08-06 NOTE — Unmapped (Signed)
Patient called to state she will come in for lab draw and evusheld.   Understands schedulers will be calling to set up apt    Denies any other needs

## 2020-08-06 NOTE — Unmapped (Signed)
Called and left patient VM again to set up a time to do her genetic panel labs and evusheld    Request sent to set up evusheld apt    Asked her to call back

## 2020-08-07 NOTE — Unmapped (Signed)
Left message for patient to call to schedule nurse visit for Evusheld Injection per inbasket from Luis Abed.

## 2020-08-08 NOTE — Unmapped (Signed)
Returned patient's voicemail asking for call back. Patient answered call and was wondering about what her protein and fiber daily intake should be.     She has gained weight since transplant, and is unhappy with this. She started using MyFitnessPal app and has successfully lost ~10 lbs so far, and is now 162.4 lbs on her home scale. She reports setting her goal for 0.5- 1.0 lb loss weekly. Per the app, it was recommended that she eat 1736 calories and 120- 150 grams of protein per day. Recommended against such a high protein intake, encouraged no more than 100 grams per day. Also encouraged that she increase her fiber intake to 25 grams per day.     She has been eating a high vegetable, high protein diet but has also been snacking on popcorn, cashews, kettle corn chips, Halo Top ice cream and Fiber One brownies. Also encouraged patient to drink more water, as she is drinking ~3 bottles per day. Recommended she drink at least 1 bottle earlier in the morning before working out.    Patient amenable to and grateful for recommendations.       Lanelle Bal, RD, LDN, CCTD  Abdominal Transplant Dietitian   Pager: [phone]

## 2020-08-08 NOTE — Unmapped (Signed)
5/19 I left a vm to schedule Evushiel injection appt.

## 2020-08-12 DIAGNOSIS — Z940 Kidney transplant status: Principal | ICD-10-CM

## 2020-08-14 LAB — CBC W/ DIFFERENTIAL
BASOPHILS ABSOLUTE COUNT: 0 10*3/uL (ref 0.0–0.2)
BASOPHILS RELATIVE PERCENT: 0 %
EOSINOPHILS ABSOLUTE COUNT: 0 10*3/uL (ref 0.0–0.4)
EOSINOPHILS RELATIVE PERCENT: 1 %
HEMATOCRIT: 35.1 % (ref 34.0–46.6)
HEMOGLOBIN: 11.3 g/dL (ref 11.1–15.9)
LYMPHOCYTES ABSOLUTE COUNT: 1.1 10*3/uL (ref 0.7–3.1)
LYMPHOCYTES RELATIVE PERCENT: 24 %
MEAN CORPUSCULAR HEMOGLOBIN CONC: 32.2 g/dL (ref 31.5–35.7)
MEAN CORPUSCULAR HEMOGLOBIN: 29.3 pg (ref 26.6–33.0)
MEAN CORPUSCULAR VOLUME: 91 fL (ref 79–97)
MONOCYTES ABSOLUTE COUNT: 0.3 10*3/uL (ref 0.1–0.9)
MONOCYTES RELATIVE PERCENT: 6 %
NEUTROPHILS ABSOLUTE COUNT: 3.2 10*3/uL (ref 1.4–7.0)
NEUTROPHILS RELATIVE PERCENT: 69 %
PLATELET COUNT: 221 10*3/uL (ref 150–450)
RED BLOOD CELL COUNT: 3.86 x10E6/uL (ref 3.77–5.28)
RED CELL DISTRIBUTION WIDTH: 13.2 % (ref 11.7–15.4)
WHITE BLOOD CELL COUNT: 4.6 10*3/uL (ref 3.4–10.8)

## 2020-08-14 LAB — BASIC METABOLIC PANEL
BLOOD UREA NITROGEN: 34 mg/dL — ABNORMAL HIGH (ref 6–20)
BUN / CREAT RATIO: 28 — ABNORMAL HIGH (ref 9–23)
CALCIUM: 10.2 mg/dL (ref 8.7–10.2)
CHLORIDE: 104 mmol/L (ref 96–106)
CO2: 20 mmol/L (ref 20–29)
CREATININE: 1.22 mg/dL — ABNORMAL HIGH (ref 0.57–1.00)
EGFR: 61 mL/min/{1.73_m2}
GLUCOSE: 89 mg/dL (ref 65–99)
POTASSIUM: 5.1 mmol/L (ref 3.5–5.2)
SODIUM: 139 mmol/L (ref 134–144)

## 2020-08-14 LAB — PHOSPHORUS: PHOSPHORUS, SERUM: 3.5 mg/dL (ref 3.0–4.3)

## 2020-08-14 LAB — MAGNESIUM: MAGNESIUM: 1.7 mg/dL (ref 1.6–2.3)

## 2020-08-14 NOTE — Unmapped (Signed)
[facility] Refill Coordination Note    Specialty Medication(s) to be Shipped:   Transplant: Envarsus 1mg , Envarsus 4mg ,  mycophenolic acid 180mg  and Prednisone 5mg     Other medication(s) to be shipped: Mg, Sodium Bicarb     [facility], DOB: 1990/12/30  Phone: [phone] (home)       All above HIPAA information was verified with patient.     Was a Nurse, learning disability used for this call? No    Completed refill call assessment today to schedule patient's medication shipment from the [facility] [phone]).  All relevant notes have been reviewed.     Specialty medication(s) and dose(s) confirmed: Regimen is correct and unchanged.   Changes to medications: Korbyn reports no changes at this time.  Changes to insurance: No  New side effects reported not previously addressed with a pharmacist or physician: None reported  Questions for the pharmacist: No    Confirmed patient received a Conservation officer, historic buildings and a Surveyor, mining with first shipment. The patient will receive a drug information handout for each medication shipped and additional FDA Medication Guides as required.       DISEASE/MEDICATION-SPECIFIC INFORMATION        N/A    SPECIALTY MEDICATION ADHERENCE     Medication Adherence    Patient reported X missed doses in the last month: 0  Specialty Medication: Envarsus Xr 1mg   Patient is on additional specialty medications: Yes  Additional Specialty Medications: Envarsus Xr 4mg   Patient Reported Additional Medication X Missed Doses in the Last Month: 0  Patient is on more than two specialty medications: Yes  Specialty Medication: Mycophenolate 180mg   Patient Reported Additional Medication X Missed Doses in the Last Month: 0  Specialty Medication: Prednisone 5mg   Patient Reported Additional Medication X Missed Doses in the Last Month: 0              Were doses missed due to medication being on hold? No    Envarsus Xr 1 mg: 7 days of medicine on hand   Envarsus Xr 4 mg: 7 days of medicine on hand   Mycophenolate 180 mg: 7 days of medicine on hand   Prednisone 5 mg: 7 days of medicine on hand     REFERRAL TO PHARMACIST     Referral to the pharmacist: Not needed      SHIPPING     Shipping address confirmed in Epic.     Delivery Scheduled: Yes, Expected medication delivery date: 07/17/20.     Medication will be delivered via UPS to the prescription address in Epic WAM.    Tera Helper   [facility] Specialty Pharmacist

## 2020-08-15 LAB — TACROLIMUS LEVEL: TACROLIMUS BLOOD: 8.5 ng/mL (ref 2.0–20.0)

## 2020-08-15 MED FILL — PREDNISONE 5 MG TABLET: ORAL | 30 days supply | Qty: 30 | Fill #7

## 2020-08-15 MED FILL — SODIUM BICARBONATE 650 MG TABLET: ORAL | 30 days supply | Qty: 120 | Fill #7

## 2020-08-15 MED FILL — ENVARSUS XR 1 MG TABLET,EXTENDED RELEASE: ORAL | 30 days supply | Qty: 90 | Fill #5

## 2020-08-15 MED FILL — MG-PLUS-PROTEIN 133 MG TABLET: ORAL | 30 days supply | Qty: 120 | Fill #5

## 2020-08-15 MED FILL — MYCOPHENOLATE SODIUM 180 MG TABLET,DELAYED RELEASE: ORAL | 30 days supply | Qty: 240 | Fill #8

## 2020-08-15 MED FILL — ENVARSUS XR 4 MG TABLET,EXTENDED RELEASE: ORAL | 30 days supply | Qty: 30 | Fill #5

## 2020-08-26 DIAGNOSIS — Z940 Kidney transplant status: Principal | ICD-10-CM

## 2020-08-30 ENCOUNTER — Ambulatory Visit: Admit: 2020-08-30 | Discharge: 2020-08-31 | Payer: PRIVATE HEALTH INSURANCE

## 2020-08-30 DIAGNOSIS — Z940 Kidney transplant status: Principal | ICD-10-CM

## 2020-08-30 DIAGNOSIS — N058 Unspecified nephritic syndrome with other morphologic changes: Principal | ICD-10-CM

## 2020-08-30 LAB — CBC W/ AUTO DIFF
BASOPHILS ABSOLUTE COUNT: 0.1 10*9/L (ref 0.0–0.1)
BASOPHILS RELATIVE PERCENT: 1.2 %
EOSINOPHILS ABSOLUTE COUNT: 0.1 10*9/L (ref 0.0–0.5)
EOSINOPHILS RELATIVE PERCENT: 1.6 %
HEMATOCRIT: 33.7 % — ABNORMAL LOW (ref 34.0–44.0)
HEMOGLOBIN: 11.2 g/dL — ABNORMAL LOW (ref 11.3–14.9)
LYMPHOCYTES ABSOLUTE COUNT: 1.1 10*9/L (ref 1.1–3.6)
LYMPHOCYTES RELATIVE PERCENT: 21.1 %
MEAN CORPUSCULAR HEMOGLOBIN CONC: 33.2 g/dL (ref 32.0–36.0)
MEAN CORPUSCULAR HEMOGLOBIN: 29.9 pg (ref 25.9–32.4)
MEAN CORPUSCULAR VOLUME: 90 fL (ref 77.6–95.7)
MEAN PLATELET VOLUME: 7.9 fL (ref 6.8–10.7)
MONOCYTES ABSOLUTE COUNT: 0.5 10*9/L (ref 0.3–0.8)
MONOCYTES RELATIVE PERCENT: 10.7 %
NEUTROPHILS ABSOLUTE COUNT: 3.3 10*9/L (ref 1.8–7.8)
NEUTROPHILS RELATIVE PERCENT: 65.4 %
NUCLEATED RED BLOOD CELLS: 0 /100{WBCs} (ref <=4)
PLATELET COUNT: 218 10*9/L (ref 150–450)
RED BLOOD CELL COUNT: 3.74 10*12/L — ABNORMAL LOW (ref 3.95–5.13)
RED CELL DISTRIBUTION WIDTH: 14.6 % (ref 12.2–15.2)
WBC ADJUSTED: 5 10*9/L (ref 3.6–11.2)

## 2020-08-30 LAB — URINALYSIS
BILIRUBIN UA: NEGATIVE
GLUCOSE UA: NEGATIVE
KETONES UA: NEGATIVE
LEUKOCYTE ESTERASE UA: NEGATIVE
NITRITE UA: NEGATIVE
PH UA: 6 (ref 5.0–9.0)
PROTEIN UA: NEGATIVE
RBC UA: 1 /HPF (ref <4)
SPECIFIC GRAVITY UA: 1.025 (ref 1.005–1.030)
SQUAMOUS EPITHELIAL: 19 /HPF — ABNORMAL HIGH (ref 0–5)
TRANSITIONAL EPITHELIAL: 1 /HPF (ref 0–2)
UROBILINOGEN UA: 0.2
WBC UA: 3 /HPF (ref 0–5)

## 2020-08-30 LAB — BASIC METABOLIC PANEL
ANION GAP: 9 mmol/L (ref 5–14)
BLOOD UREA NITROGEN: 37 mg/dL — ABNORMAL HIGH (ref 9–23)
BUN / CREAT RATIO: 38
CALCIUM: 10.7 mg/dL — ABNORMAL HIGH (ref 8.7–10.4)
CHLORIDE: 105 mmol/L (ref 98–107)
CO2: 23.3 mmol/L (ref 20.0–31.0)
CREATININE: 0.97 mg/dL — ABNORMAL HIGH
EGFR CKD-EPI (2021) FEMALE: 80 mL/min/{1.73_m2} (ref >=60)
GLUCOSE RANDOM: 87 mg/dL (ref 70–99)
POTASSIUM: 4.5 mmol/L (ref 3.4–4.8)
SODIUM: 137 mmol/L (ref 135–145)

## 2020-08-30 LAB — TACROLIMUS LEVEL, TROUGH: TACROLIMUS, TROUGH: 8.8 ng/mL (ref 5.0–15.0)

## 2020-08-30 LAB — SLIDE REVIEW

## 2020-08-30 LAB — PROTEIN / CREATININE RATIO, URINE
CREATININE, URINE: 105.7 mg/dL
PROTEIN URINE: 10.4 mg/dL
PROTEIN/CREAT RATIO, URINE: 0.098

## 2020-08-30 LAB — PHOSPHORUS: PHOSPHORUS: 3.6 mg/dL (ref 2.4–5.1)

## 2020-08-30 LAB — MAGNESIUM: MAGNESIUM: 1.8 mg/dL (ref 1.6–2.6)

## 2020-08-30 MED ADMIN — tixagevimab-cilgavimab 300 mg/3 mL- 300 mg/3 mL injection 6 mL: 6 mL | INTRAMUSCULAR | @ 14:00:00 | Stop: 2020-08-30

## 2020-08-30 NOTE — Unmapped (Signed)
Pt here for Evusheld injections. Education and handout provided to pt and questions answered. Signs and symptoms gone over with patient and they verbalized understanding. First injection given to right side gluteal muscle and second given to left side. Pt tolerated without difficulty. Pt in lobby for one hour observation.     BP: 124/87  P: 89  O2: 99% RA

## 2020-09-09 DIAGNOSIS — Z940 Kidney transplant status: Principal | ICD-10-CM

## 2020-09-11 LAB — CBC W/ DIFFERENTIAL
BANDED NEUTROPHILS ABSOLUTE COUNT: 0.2 10*3/uL — ABNORMAL HIGH (ref 0.0–0.1)
BASOPHILS ABSOLUTE COUNT: 0.1 10*3/uL (ref 0.0–0.2)
BASOPHILS RELATIVE PERCENT: 1 %
EOSINOPHILS ABSOLUTE COUNT: 0.1 10*3/uL (ref 0.0–0.4)
EOSINOPHILS RELATIVE PERCENT: 2 %
HEMATOCRIT: 34.1 % (ref 34.0–46.6)
HEMOGLOBIN: 11 g/dL — ABNORMAL LOW (ref 11.1–15.9)
IMMATURE GRANULOCYTES: 5 %
LYMPHOCYTES ABSOLUTE COUNT: 1 10*3/uL (ref 0.7–3.1)
LYMPHOCYTES RELATIVE PERCENT: 22 %
MEAN CORPUSCULAR HEMOGLOBIN CONC: 32.3 g/dL (ref 31.5–35.7)
MEAN CORPUSCULAR HEMOGLOBIN: 30.2 pg (ref 26.6–33.0)
MEAN CORPUSCULAR VOLUME: 94 fL (ref 79–97)
MONOCYTES ABSOLUTE COUNT: 0.5 10*3/uL (ref 0.1–0.9)
MONOCYTES RELATIVE PERCENT: 11 %
NEUTROPHILS ABSOLUTE COUNT: 2.8 10*3/uL (ref 1.4–7.0)
NEUTROPHILS RELATIVE PERCENT: 59 %
PLATELET COUNT: 234 10*3/uL (ref 150–450)
RED BLOOD CELL COUNT: 3.64 x10E6/uL — ABNORMAL LOW (ref 3.77–5.28)
RED CELL DISTRIBUTION WIDTH: 13.4 % (ref 11.7–15.4)
WHITE BLOOD CELL COUNT: 4.7 10*3/uL (ref 3.4–10.8)

## 2020-09-11 LAB — MAGNESIUM: MAGNESIUM: 1.7 mg/dL (ref 1.6–2.3)

## 2020-09-11 LAB — BASIC METABOLIC PANEL
BLOOD UREA NITROGEN: 34 mg/dL — ABNORMAL HIGH (ref 6–20)
BUN / CREAT RATIO: 30 — ABNORMAL HIGH (ref 9–23)
CALCIUM: 10.3 mg/dL — ABNORMAL HIGH (ref 8.7–10.2)
CHLORIDE: 103 mmol/L (ref 96–106)
CO2: 20 mmol/L (ref 20–29)
CREATININE: 1.15 mg/dL — ABNORMAL HIGH (ref 0.57–1.00)
EGFR: 65 mL/min/{1.73_m2}
GLUCOSE: 88 mg/dL (ref 65–99)
POTASSIUM: 4.5 mmol/L (ref 3.5–5.2)
SODIUM: 136 mmol/L (ref 134–144)

## 2020-09-11 LAB — PHOSPHORUS: PHOSPHORUS, SERUM: 4.2 mg/dL (ref 3.0–4.3)

## 2020-09-11 NOTE — Unmapped (Signed)
[facility] Clinical Assessment & Refill Coordination Note    Carolyn Kelley, DOB: 16-Oct-1990  Phone: [phone] (home)     All above HIPAA information was verified with patient.     Was a Nurse, learning disability used for this call? No    Specialty Medication(s):   Transplant: Envarsus 1mg , Envarsus 4mg ,  mycophenolic acid 180mg  and Prednisone 5mg      Current Outpatient Medications   Medication Sig Dispense Refill   ??? acetaminophen (TYLENOL) 500 MG tablet Take 1-2 tablets (500-1,000 mg total) by mouth every six (6) hours as needed for pain or fever (> 38C). 100 tablet 0   ??? aspirin (ECOTRIN) 81 MG tablet Take 1 tablet (81 mg total) by mouth daily. 30 tablet 11   ??? cholecalciferol, vitamin D3-125 mcg, 5,000 unit,, 125 mcg (5,000 unit) capsule Take 1 capsule (125 mcg total) by mouth daily. 100 capsule 11   ??? clonazePAM (KLONOPIN) 0.5 MG tablet Take 1 tablet (0.5 mg total) by mouth two (2) times a day. 60 tablet 3   ??? clonazePAM (KLONOPIN) 0.5 MG tablet Take 1 tablet by mouth Two (2) times a day.     ??? levETIRAcetam (KEPPRA) 250 MG tablet TAKE 1 TABLET BY MOUTH TWICE DAILY. 180 tablet 3   ??? levETIRAcetam (KEPPRA) 250 MG tablet Take 1 tablet by mouth Two (2) times a day.     ??? magnesium oxide-Mg AA chelate (MAGNESIUM, AMINO ACID CHELATE,) 133 mg Tab Take 2 tablets by mouth Two (2) times a day. 120 tablet 11   ??? mycophenolate (MYFORTIC) 180 MG EC tablet Take 4 tablets (720 mg total) by mouth Two (2) times a day. Adjust dose per medication card. 240 tablet 11   ??? predniSONE (DELTASONE) 5 MG tablet Take 1 tablet (5 mg total) by mouth daily. 30 tablet 11   ??? sodium bicarbonate 650 mg tablet Take 2 tablets (1,300 mg total) by mouth Two (2) times a day. 120 tablet 11   ??? tacrolimus (ENVARSUS XR) 1 mg Tb24 extended release tablet Take three (1mg ) tablets with one (4mg ) tablet by mouth daily for a total daily dose of 7 mg. 90 tablet 11   ??? tacrolimus (ENVARSUS XR) 4 mg Tb24 extended release tablet Take one (4mg ) tablet with three (1mg ) tablets by mouth daily for a total daily dose of 7 mg. 30 tablet 11   ??? tretinoin (RETIN-A) 0.025 % cream Start every other night and increase to nightly as tolerated 45 g 3     No current facility-administered medications for this visit.        Changes to medications: [name] reports no changes at this time.    Allergies   Allergen Reactions   ??? Bee Pollen Swelling     Sneezing, congestion  denies swelling.  Sneezing, congestion  denies swelling.     ??? Pollen Extracts Swelling       Changes to allergies: No    SPECIALTY MEDICATION ADHERENCE     Envarsus Xr 1 mg: 10 days of medicine on hand   Envarsus Xr 4 mg: 10 days of medicine on hand   Mycophenolate 180 mg: 10 days of medicine on hand   Prednisone 5 mg: 10 days of medicine on hand       Medication Adherence    Patient reported X missed doses in the last month: 0  Specialty Medication: Envarsus Xr 1mg   Patient is on additional specialty medications: Yes  Additional Specialty Medications: Envarsus Xr 4mg   Patient Reported Additional Medication X Missed Doses in the Last Month: 0  Patient is on more than two specialty medications: Yes  Specialty Medication: Prednisone 5mg   Patient Reported Additional Medication X Missed Doses in the Last Month: 0  Specialty Medication: Mycophenolate 180mg   Patient Reported Additional Medication X Missed Doses in the Last Month: 0          Specialty medication(s) dose(s) confirmed: Regimen is correct and unchanged.     Are there any concerns with adherence? No    Adherence counseling provided? Not needed    CLINICAL MANAGEMENT AND INTERVENTION      Clinical Benefit Assessment:    Do you feel the medicine is effective or helping your condition? Yes    Clinical Benefit counseling provided? Not needed    Adverse Effects Assessment:    Are you experiencing any side effects? No    Are you experiencing difficulty administering your medicine? No    Quality of Life Assessment:    How many days over the past month did your kidney transplant  keep you from your normal activities? For example, brushing your teeth or getting up in the morning. 0    Have you discussed this with your provider? Not needed    Acute Infection Status:    Acute infections noted within Epic:  No active infections  Patient reported infection: None    Therapy Appropriateness:    Is therapy appropriate? Yes, therapy is appropriate and should be continued    DISEASE/MEDICATION-SPECIFIC INFORMATION      N/A    PATIENT SPECIFIC NEEDS     - Does the patient have any physical, cognitive, or cultural barriers? No    - Is the patient high risk? Yes, patient is taking a REMS drug. Medication is dispensed in compliance with REMS program    - Does the patient require a Care Management Plan? No     - Does the patient require physician intervention or other additional services (i.e. nutrition, smoking cessation, social work)? No      SHIPPING     Specialty Medication(s) to be Shipped:   Transplant: Envarsus 1mg , Envarsus 4mg ,  mycophenolic acid 180mg  and Prednisone 5mg     Other medication(s) to be shipped: Vit D, Levetiracetam, Mg     Changes to insurance: No    Delivery Scheduled: Yes, Expected medication delivery date: 09/17/20.     Medication will be delivered via UPS to the confirmed prescription address in [facility].    The patient will receive a drug information handout for each medication shipped and additional FDA Medication Guides as required.  Verified that patient has previously received a Conservation officer, historic buildings and a Surveyor, mining.    The patient or caregiver noted above participated in the development of this care plan and knows that they can request review of or adjustments to the care plan at any time.      All of the patient's questions and concerns have been addressed.    Tera Helper   [facility] Specialty Pharmacist

## 2020-09-12 LAB — TACROLIMUS LEVEL: TACROLIMUS BLOOD: 6.3 ng/mL (ref 2.0–20.0)

## 2020-09-16 MED FILL — PREDNISONE 5 MG TABLET: ORAL | 30 days supply | Qty: 30 | Fill #8

## 2020-09-16 MED FILL — ENVARSUS XR 4 MG TABLET,EXTENDED RELEASE: ORAL | 30 days supply | Qty: 30 | Fill #6

## 2020-09-16 MED FILL — MYCOPHENOLATE SODIUM 180 MG TABLET,DELAYED RELEASE: ORAL | 30 days supply | Qty: 240 | Fill #9

## 2020-09-16 MED FILL — ENVARSUS XR 1 MG TABLET,EXTENDED RELEASE: ORAL | 30 days supply | Qty: 90 | Fill #6

## 2020-09-16 MED FILL — LEVETIRACETAM 250 MG TABLET: ORAL | 90 days supply | Qty: 180 | Fill #1

## 2020-09-16 MED FILL — CHOLECALCIFEROL (VITAMIN D3) 125 MCG (5,000 UNIT) CAPSULE: ORAL | 100 days supply | Qty: 100 | Fill #1

## 2020-09-16 MED FILL — MG-PLUS-PROTEIN 133 MG TABLET: ORAL | 30 days supply | Qty: 120 | Fill #6

## 2020-09-18 NOTE — Unmapped (Signed)
Pt paged on call [facility] to ask what BC she can take instead of getting IUD. Reviewed with pharmacist and she reports pt can take any oral contraceptive that GYN provides but to please let [facility] know what she is placed on, pt states understanding.

## 2020-09-23 DIAGNOSIS — Z940 Kidney transplant status: Principal | ICD-10-CM

## 2020-09-25 LAB — BASIC METABOLIC PANEL
BLOOD UREA NITROGEN: 32 mg/dL — ABNORMAL HIGH (ref 6–20)
BUN / CREAT RATIO: 26 — ABNORMAL HIGH (ref 9–23)
CALCIUM: 10.6 mg/dL — ABNORMAL HIGH (ref 8.7–10.2)
CHLORIDE: 102 mmol/L (ref 96–106)
CO2: 20 mmol/L (ref 20–29)
CREATININE: 1.21 mg/dL — ABNORMAL HIGH (ref 0.57–1.00)
EGFR: 61 mL/min/{1.73_m2}
GLUCOSE: 88 mg/dL (ref 65–99)
POTASSIUM: 5.1 mmol/L (ref 3.5–5.2)
SODIUM: 138 mmol/L (ref 134–144)

## 2020-09-25 LAB — CBC W/ DIFFERENTIAL
BANDED NEUTROPHILS ABSOLUTE COUNT: 0.2 10*3/uL — ABNORMAL HIGH (ref 0.0–0.1)
BASOPHILS ABSOLUTE COUNT: 0 10*3/uL (ref 0.0–0.2)
BASOPHILS RELATIVE PERCENT: 1 %
EOSINOPHILS ABSOLUTE COUNT: 0.1 10*3/uL (ref 0.0–0.4)
EOSINOPHILS RELATIVE PERCENT: 2 %
HEMATOCRIT: 35.7 % (ref 34.0–46.6)
HEMOGLOBIN: 11.6 g/dL (ref 11.1–15.9)
IMMATURE GRANULOCYTES: 5 %
LYMPHOCYTES ABSOLUTE COUNT: 1 10*3/uL (ref 0.7–3.1)
LYMPHOCYTES RELATIVE PERCENT: 25 %
MEAN CORPUSCULAR HEMOGLOBIN CONC: 32.5 g/dL (ref 31.5–35.7)
MEAN CORPUSCULAR HEMOGLOBIN: 30 pg (ref 26.6–33.0)
MEAN CORPUSCULAR VOLUME: 92 fL (ref 79–97)
MONOCYTES ABSOLUTE COUNT: 0.5 10*3/uL (ref 0.1–0.9)
MONOCYTES RELATIVE PERCENT: 12 %
NEUTROPHILS ABSOLUTE COUNT: 2.3 10*3/uL (ref 1.4–7.0)
NEUTROPHILS RELATIVE PERCENT: 55 %
PLATELET COUNT: 235 10*3/uL (ref 150–450)
RED BLOOD CELL COUNT: 3.87 x10E6/uL (ref 3.77–5.28)
RED CELL DISTRIBUTION WIDTH: 13.3 % (ref 11.7–15.4)
WHITE BLOOD CELL COUNT: 4.2 10*3/uL (ref 3.4–10.8)

## 2020-09-25 LAB — MAGNESIUM: MAGNESIUM: 1.8 mg/dL (ref 1.6–2.3)

## 2020-09-25 LAB — PHOSPHORUS: PHOSPHORUS, SERUM: 3.8 mg/dL (ref 3.0–4.3)

## 2020-09-26 LAB — TACROLIMUS LEVEL: TACROLIMUS BLOOD: 6.3 ng/mL (ref 2.0–20.0)

## 2020-09-29 ENCOUNTER — Other Ambulatory Visit: Payer: Self-pay | Admitting: Physical Medicine & Rehabilitation

## 2020-09-29 DIAGNOSIS — G253 Myoclonus: Secondary | ICD-10-CM

## 2020-09-30 ENCOUNTER — Telehealth: Payer: Self-pay

## 2020-09-30 DIAGNOSIS — G253 Myoclonus: Secondary | ICD-10-CM

## 2020-09-30 MED ORDER — CLONAZEPAM 0.5 MG PO TABS: ORAL_TABLET | ORAL | 0 refills | Status: DC

## 2020-09-30 NOTE — Telephone Encounter (Signed)
Rx called to the pharmacy. 

## 2020-10-07 DIAGNOSIS — Z940 Kidney transplant status: Principal | ICD-10-CM

## 2020-10-09 NOTE — Unmapped (Signed)
[facility] Refill Coordination Note    Specialty Medication(s) to be Shipped:   Transplant: Envarsus XR 1mg , Envarsus XR 4mg , mycophenolate mofetil 180mg  and Prednisone 5mg   Other medication(s) to be shipped: Sodium Bicarb 650mg  & Mag Plus Protein 133mg      Carolyn Kelley, DOB: 02-24-1991  Phone: [phone] (home)     All above HIPAA information was verified with patient.     Was a Nurse, learning disability used for this call? No    Completed refill call assessment today to schedule patient's medication shipment from the [facility] [phone]).  All relevant notes have been reviewed.     Specialty medication(s) and dose(s) confirmed: Regimen is correct and unchanged.   Changes to medications: [name] reports no changes at this time.  Changes to insurance: No  New side effects reported not previously addressed with a pharmacist or physician: None reported  Questions for the pharmacist: No    Confirmed patient received a Conservation officer, historic buildings and a Surveyor, mining with first shipment. The patient will receive a drug information handout for each medication shipped and additional FDA Medication Guides as required.       DISEASE/MEDICATION-SPECIFIC INFORMATION        N/A    SPECIALTY MEDICATION ADHERENCE     Medication Adherence    Patient reported X missed doses in the last month: 0  Specialty Medication: tacrolimus (ENVARSUS XR) 4 mg Tb24 extended release tablet  Patient is on additional specialty medications: Yes  Additional Specialty Medications: tacrolimus (ENVARSUS XR) 1 mg Tb24 extended release tablet  Patient Reported Additional Medication X Missed Doses in the Last Month: 0  Patient is on more than two specialty medications: Yes  Specialty Medication: mycophenolate (MYFORTIC) 180 MG EC tablet  Patient Reported Additional Medication X Missed Doses in the Last Month: 0  Informant: patient  Reliability of informant: reliable  Reasons for non-adherence: no problems identified  Confirmed plan for next specialty medication refill: delivery by pharmacy  Refills needed for supportive medications: not needed        Were doses missed due to medication being on hold? No    Envarsus XR  1 MG: 7 days of medicine on hand   Envarsus XR 4 mg: 7 days of medicine on hand   Mycophenolate 180 mg: 7 days of medicine on hand   Prednisone 5 mg: 7 days of medicine on hand     REFERRAL TO PHARMACIST     Referral to the pharmacist: Not needed    SHIPPING     Shipping address confirmed in Epic.     Delivery Scheduled: Yes, Expected medication delivery date: 10/15/2020.     Medication will be delivered via UPS to the prescription address in Epic WAM.    Mahari Vankirk P [facility] Specialty Technician

## 2020-10-14 MED FILL — ENVARSUS XR 1 MG TABLET,EXTENDED RELEASE: ORAL | 30 days supply | Qty: 90 | Fill #7

## 2020-10-14 MED FILL — SODIUM BICARBONATE 650 MG TABLET: ORAL | 30 days supply | Qty: 120 | Fill #8

## 2020-10-14 MED FILL — MG-PLUS-PROTEIN 133 MG TABLET: ORAL | 30 days supply | Qty: 120 | Fill #7

## 2020-10-14 MED FILL — MYCOPHENOLATE SODIUM 180 MG TABLET,DELAYED RELEASE: ORAL | 30 days supply | Qty: 240 | Fill #10

## 2020-10-14 MED FILL — ENVARSUS XR 4 MG TABLET,EXTENDED RELEASE: ORAL | 30 days supply | Qty: 30 | Fill #7

## 2020-10-14 MED FILL — PREDNISONE 5 MG TABLET: ORAL | 30 days supply | Qty: 30 | Fill #9

## 2020-10-18 NOTE — Unmapped (Signed)
Patient going to get a pap smear by her pcp and was going to get a birth control pill or IUD. Asked if either were OK. Let her know both are fine. She had some concerns that she would not tolerate getting an IUD placed since pelvic exams were hard for her to tolerate.  Encouraged her to talk to PCP and express her concerns. She will    States her family has covid and she is the last one with out symptoms. She will test in 2 days or if she becomes symptomatic. Family is isolating in the house away form her, wearing masks, and disinfecting surfaces. She is going to go stay with her aunt.  She will call if she does test postive    Denies any other needs

## 2020-10-21 ENCOUNTER — Telehealth: Payer: Self-pay | Admitting: Physical Medicine & Rehabilitation

## 2020-10-21 DIAGNOSIS — Z940 Kidney transplant status: Principal | ICD-10-CM

## 2020-10-21 NOTE — Telephone Encounter (Signed)
Patient rescheduled apt, anyone who could bring her has COVID and she has been exposed, she is feeling alright.. She wanted you to be notified.

## 2020-10-23 ENCOUNTER — Encounter: Payer: Medicaid Other | Admitting: Physical Medicine & Rehabilitation

## 2020-10-29 NOTE — Unmapped (Signed)
Patient called to see if she has had meningococcal vaccine.  Also talked about monkeypox    Reports she did not get covid from her family and they are all now recovered and testing negative    Denies any other needs

## 2020-11-04 DIAGNOSIS — Z940 Kidney transplant status: Principal | ICD-10-CM

## 2020-11-04 NOTE — Unmapped (Signed)
[facility]ill [facility]ination [facility]    Specialty Medication(s) to be Shipped:   Transplant: Envarsus 1mg , Envarsus 4mg , mycophenolate mofetil 180mg  and Prednisone 5mg     Other medication(s) to be shipped: magnesium and sodium bicarb     Carolyn [facility], DOB: 1990/07/01  Phone: [phone] (home)       All above HIPAA information was verified with patient.     Was a Nurse, learning disability used for this call? No    Completed refill call assessment today to schedule patient's medication shipment from the [facility] [phone]).  All relevant notes have been reviewed.     Specialty medication(s) and dose(s) confirmed: Regimen is correct and unchanged.   Changes to medications: Elham reports no changes at this time.  Changes to insurance: No  New side effects reported not previously addressed with a pharmacist or physician: None reported  Questions for the pharmacist: No    Confirmed patient received a Conservation officer, historic buildings and a Surveyor, mining with first shipment. The patient will receive a drug information handout for each medication shipped and additional FDA Medication Guides as required.       DISEASE/MEDICATION-SPECIFIC INFORMATION        N/A    SPECIALTY MEDICATION ADHERENCE     Medication Adherence    Patient reported X missed doses in the last month: 0  Specialty Medication: Mycophenolate 180mg   Patient is on additional specialty medications: Yes  Additional Specialty Medications: Prednisone 5mg   Patient Reported Additional Medication X Missed Doses in the Last Month: 0  Patient is on more than two specialty medications: Yes  Specialty Medication: Envarsus 1mg   Patient Reported Additional Medication X Missed Doses in the Last Month: 0  Specialty Medication: Envarsus 4mg   Patient Reported Additional Medication X Missed Doses in the Last Month: 0        Were doses missed due to medication being on hold? No    Mycophenolate 180 mg: 10 days of medicine on hand   Prednisone 5 mg: 10 days of medicine on hand   Envarsus 1 mg: 10 days of medicine on hand   Envarsus 4 mg: 10 days of medicine on hand     REFERRAL TO PHARMACIST     Referral to the pharmacist: Not needed      SHIPPING     Shipping address confirmed in Epic.     Delivery Scheduled: Yes, Expected medication delivery date: 11/12/2020.     Medication will be delivered via UPS to the prescription address in Epic WAM.    [name] [facility] Specialty Technician

## 2020-11-06 LAB — CBC W/ DIFFERENTIAL
BANDED NEUTROPHILS ABSOLUTE COUNT: 0.2 10*3/uL — ABNORMAL HIGH (ref 0.0–0.1)
BASOPHILS ABSOLUTE COUNT: 0 10*3/uL (ref 0.0–0.2)
BASOPHILS RELATIVE PERCENT: 1 %
EOSINOPHILS ABSOLUTE COUNT: 0.2 10*3/uL (ref 0.0–0.4)
EOSINOPHILS RELATIVE PERCENT: 4 %
HEMATOCRIT: 34.1 % (ref 34.0–46.6)
HEMOGLOBIN: 11.4 g/dL (ref 11.1–15.9)
IMMATURE GRANULOCYTES: 3 %
LYMPHOCYTES ABSOLUTE COUNT: 1 10*3/uL (ref 0.7–3.1)
LYMPHOCYTES RELATIVE PERCENT: 21 %
MEAN CORPUSCULAR HEMOGLOBIN CONC: 33.4 g/dL (ref 31.5–35.7)
MEAN CORPUSCULAR HEMOGLOBIN: 30.6 pg (ref 26.6–33.0)
MEAN CORPUSCULAR VOLUME: 92 fL (ref 79–97)
MONOCYTES ABSOLUTE COUNT: 0.5 10*3/uL (ref 0.1–0.9)
MONOCYTES RELATIVE PERCENT: 11 %
NEUTROPHILS ABSOLUTE COUNT: 2.8 10*3/uL (ref 1.4–7.0)
NEUTROPHILS RELATIVE PERCENT: 60 %
PLATELET COUNT: 228 10*3/uL (ref 150–450)
RED BLOOD CELL COUNT: 3.72 x10E6/uL — ABNORMAL LOW (ref 3.77–5.28)
RED CELL DISTRIBUTION WIDTH: 13 % (ref 11.7–15.4)
WHITE BLOOD CELL COUNT: 4.7 10*3/uL (ref 3.4–10.8)

## 2020-11-06 LAB — BASIC METABOLIC PANEL
BLOOD UREA NITROGEN: 39 mg/dL — ABNORMAL HIGH (ref 6–20)
BUN / CREAT RATIO: 31 — ABNORMAL HIGH (ref 9–23)
CALCIUM: 10.5 mg/dL — ABNORMAL HIGH (ref 8.7–10.2)
CHLORIDE: 102 mmol/L (ref 96–106)
CO2: 21 mmol/L (ref 20–29)
CREATININE: 1.25 mg/dL — ABNORMAL HIGH (ref 0.57–1.00)
EGFR: 59 mL/min/{1.73_m2} — ABNORMAL LOW
GLUCOSE: 95 mg/dL (ref 65–99)
POTASSIUM: 4.5 mmol/L (ref 3.5–5.2)
SODIUM: 138 mmol/L (ref 134–144)

## 2020-11-06 LAB — PHOSPHORUS: PHOSPHORUS, SERUM: 4.1 mg/dL (ref 3.0–4.3)

## 2020-11-06 LAB — MAGNESIUM: MAGNESIUM: 1.7 mg/dL (ref 1.6–2.3)

## 2020-11-07 DIAGNOSIS — Z940 Kidney transplant status: Principal | ICD-10-CM

## 2020-11-07 LAB — TACROLIMUS LEVEL: TACROLIMUS BLOOD: 13.7 ng/mL (ref 2.0–20.0)

## 2020-11-07 NOTE — Unmapped (Signed)
PHS labs

## 2020-11-11 MED FILL — ENVARSUS XR 4 MG TABLET,EXTENDED RELEASE: ORAL | 30 days supply | Qty: 30 | Fill #8

## 2020-11-11 MED FILL — PREDNISONE 5 MG TABLET: ORAL | 30 days supply | Qty: 30 | Fill #10

## 2020-11-11 MED FILL — MG-PLUS-PROTEIN 133 MG TABLET: ORAL | 30 days supply | Qty: 120 | Fill #8

## 2020-11-11 MED FILL — MYCOPHENOLATE SODIUM 180 MG TABLET,DELAYED RELEASE: ORAL | 30 days supply | Qty: 240 | Fill #11

## 2020-11-11 MED FILL — ENVARSUS XR 1 MG TABLET,EXTENDED RELEASE: ORAL | 30 days supply | Qty: 90 | Fill #8

## 2020-11-11 MED FILL — SODIUM BICARBONATE 650 MG TABLET: ORAL | 30 days supply | Qty: 120 | Fill #9

## 2020-11-18 DIAGNOSIS — Z940 Kidney transplant status: Principal | ICD-10-CM

## 2020-11-20 LAB — IMMATURE CELLS: BANDS: 1 %

## 2020-11-20 LAB — CBC W/ DIFFERENTIAL
BASOPHILS ABSOLUTE COUNT: 0 10*3/uL (ref 0.0–0.2)
BASOPHILS RELATIVE PERCENT: 0 %
EOSINOPHILS ABSOLUTE COUNT: 0.2 10*3/uL (ref 0.0–0.4)
EOSINOPHILS RELATIVE PERCENT: 4 %
HEMATOCRIT: 33.6 % — ABNORMAL LOW (ref 34.0–46.6)
HEMOGLOBIN: 10.5 g/dL — ABNORMAL LOW (ref 11.1–15.9)
LYMPHOCYTES ABSOLUTE COUNT: 0.7 10*3/uL (ref 0.7–3.1)
LYMPHOCYTES RELATIVE PERCENT: 14 %
MEAN CORPUSCULAR HEMOGLOBIN CONC: 31.3 g/dL — ABNORMAL LOW (ref 31.5–35.7)
MEAN CORPUSCULAR HEMOGLOBIN: 29.1 pg (ref 26.6–33.0)
MEAN CORPUSCULAR VOLUME: 93 fL (ref 79–97)
MONOCYTES ABSOLUTE COUNT: 0.2 10*3/uL (ref 0.1–0.9)
MONOCYTES RELATIVE PERCENT: 4 %
NEUTROPHILS ABSOLUTE COUNT: 3.9 10*3/uL (ref 1.4–7.0)
NEUTROPHILS RELATIVE PERCENT: 77 %
PLATELET COUNT: 201 10*3/uL (ref 150–450)
RED BLOOD CELL COUNT: 3.61 x10E6/uL — ABNORMAL LOW (ref 3.77–5.28)
RED CELL DISTRIBUTION WIDTH: 13.1 % (ref 11.7–15.4)
WHITE BLOOD CELL COUNT: 5 10*3/uL (ref 3.4–10.8)

## 2020-11-20 LAB — MAGNESIUM: MAGNESIUM: 1.6 mg/dL (ref 1.6–2.3)

## 2020-11-20 LAB — BASIC METABOLIC PANEL
BLOOD UREA NITROGEN: 31 mg/dL — ABNORMAL HIGH (ref 6–20)
BUN / CREAT RATIO: 32 — ABNORMAL HIGH (ref 9–23)
CALCIUM: 10.2 mg/dL (ref 8.7–10.2)
CHLORIDE: 104 mmol/L (ref 96–106)
CO2: 20 mmol/L (ref 20–29)
CREATININE: 0.97 mg/dL (ref 0.57–1.00)
EGFR: 80 mL/min/{1.73_m2}
GLUCOSE: 85 mg/dL (ref 65–99)
POTASSIUM: 4.6 mmol/L (ref 3.5–5.2)
SODIUM: 136 mmol/L (ref 134–144)

## 2020-11-20 LAB — PHOSPHORUS: PHOSPHORUS, SERUM: 3.3 mg/dL (ref 3.0–4.3)

## 2020-11-21 DIAGNOSIS — Z940 Kidney transplant status: Principal | ICD-10-CM

## 2020-11-22 LAB — TACROLIMUS LEVEL: TACROLIMUS BLOOD: 8.5 ng/mL (ref 2.0–20.0)

## 2020-11-25 ENCOUNTER — Other Ambulatory Visit: Payer: Self-pay | Admitting: Physical Medicine & Rehabilitation

## 2020-11-25 DIAGNOSIS — G253 Myoclonus: Secondary | ICD-10-CM

## 2020-11-29 ENCOUNTER — Ambulatory Visit
Admit: 2020-11-29 | Discharge: 2020-11-30 | Payer: PRIVATE HEALTH INSURANCE | Attending: Dermatology | Primary: Dermatology

## 2020-11-29 NOTE — Unmapped (Signed)
Dermatology Note     Assessment and Plan:      Adult female acne, hx of immunosupression - well-controlled  - Patient had a kidney transplant in 02/2019 and is currently taking myfortic, prednisone 5 mg daily, and envarsus. Discussed that medications such as envarsus and prednisone can trigger acne to flare.  - Discussed treatment options with patient. Mutual decision made to initiate the following:  - cont tretinoin (RETIN-A) 0.025 % cream; Start every other night and increase to nightly as tolerated  - cont benzoyl peroxide wash qday to face, chest and back; SER (dryness and bleaching of linens/clothes)  - Use gentle wash at other time of day to face      The patient was advised to call for an appointment should any new, changing, or symptomatic lesions develop.     RTC: Return in about 1 year (around 11/29/2021). or sooner as needed   _________________________________________________________________      Chief Complaint     Follow up of acne    HPI     Carol[facility] is a 30 y.o. female who presents as a returning patient (last seen by Dr. Donny Pique on 07/05/2020) to [facility] for follow up of acne. At last visit, patient was to start tretinoin 0.025% cream and benzoyl peroxide wash daily for adult female acne.     Today the patient reports improvement with her acne. Uncertain if she is getting flares during her menstrual cycle    The patient denies any other new or changing lesions or areas of concern.     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     ROS: Other than symptoms mentioned in the HPI, no fevers, chills, or other skin complaints    Physical Examination GENERAL: Well-appearing female in no acute distress, resting comfortably.  NEURO: Alert and oriented, answers questions appropriately  PSYCH: Normal mood and affect  SKIN (Focal Skin Exam): Per patient request, examination of face was performed  A  few scattered open comedones are present on the face, PIH at previous sites      All areas not commented on are within normal limits or unremarkable      (Approved Template 12/04/2019)

## 2020-12-02 DIAGNOSIS — Z940 Kidney transplant status: Principal | ICD-10-CM

## 2020-12-03 DIAGNOSIS — Z940 Kidney transplant status: Principal | ICD-10-CM

## 2020-12-03 MED ORDER — MYCOPHENOLATE SODIUM 180 MG TABLET,DELAYED RELEASE
ORAL_TABLET | Freq: Two times a day (BID) | ORAL | 11 refills | 30 days | Status: CP
Start: 2020-12-03 — End: 2021-12-03
  Filled 2020-12-10: qty 240, 30d supply, fill #0

## 2020-12-03 NOTE — Unmapped (Signed)
[facility] Refill Coordination Note    Specialty Medication(s) to be Shipped:   Transplant: Envarsus 1mg , Envarsus 4mg , mycophenolate mofetil 180mg  and Prednisone 5mg     Other medication(s) to be shipped: levetiracetam, magnesium and sodium bicarb     Carolyn [facility], DOB: 08-May-1990  Phone: [phone] (home)       All above HIPAA information was verified with patient.     Was a Nurse, learning disability used for this call? No    Completed refill call assessment today to schedule patient's medication shipment from the [facility] [phone]).  All relevant notes have been reviewed.     Specialty medication(s) and dose(s) confirmed: Regimen is correct and unchanged.   Changes to medications: Kahlia reports no changes at this time.  Changes to insurance: No  New side effects reported not previously addressed with a pharmacist or physician: None reported  Questions for the pharmacist: No    Confirmed patient received a Conservation officer, historic buildings and a Surveyor, mining with first shipment. The patient will receive a drug information handout for each medication shipped and additional FDA Medication Guides as required.       DISEASE/MEDICATION-SPECIFIC INFORMATION        N/A    SPECIALTY MEDICATION ADHERENCE     Medication Adherence    Patient reported X missed doses in the last month: 0  Specialty Medication: Mycophenolate 180mg   Patient is on additional specialty medications: Yes  Additional Specialty Medications: Prednisone 5mg   Patient Reported Additional Medication X Missed Doses in the Last Month: 0  Patient is on more than two specialty medications: Yes  Specialty Medication: Envarsus 1mg   Patient Reported Additional Medication X Missed Doses in the Last Month: 0  Specialty Medication: Envarsus 4mg   Patient Reported Additional Medication X Missed Doses in the Last Month: 0        Were doses missed due to medication being on hold? No    Mycophenolate 180 mg: 10 days of medicine on hand Prednisone 5 mg: 10 days of medicine on hand   Envarsus 1 mg: 10 days of medicine on hand   Envarsus 4 mg: 10 days of medicine on hand     REFERRAL TO PHARMACIST     Referral to the pharmacist: Not needed      SHIPPING     Shipping address confirmed in Epic.     Delivery Scheduled: Yes, Expected medication delivery date: 12/11/2020.     Medication will be delivered via UPS to the prescription address in Epic WAM.    [name] [facility] Specialty Technician

## 2020-12-03 NOTE — Unmapped (Signed)
Rx refill request came in through the [facility] Clinical Pool

## 2020-12-04 LAB — BASIC METABOLIC PANEL
BLOOD UREA NITROGEN: 34 mg/dL — ABNORMAL HIGH (ref 6–20)
BUN / CREAT RATIO: 31 — ABNORMAL HIGH (ref 9–23)
CALCIUM: 10.2 mg/dL (ref 8.7–10.2)
CHLORIDE: 104 mmol/L (ref 96–106)
CO2: 20 mmol/L (ref 20–29)
CREATININE: 1.1 mg/dL — ABNORMAL HIGH (ref 0.57–1.00)
EGFR: 69 mL/min/{1.73_m2}
GLUCOSE: 88 mg/dL (ref 65–99)
POTASSIUM: 4.7 mmol/L (ref 3.5–5.2)
SODIUM: 136 mmol/L (ref 134–144)

## 2020-12-04 LAB — CBC W/ DIFFERENTIAL
BANDED NEUTROPHILS ABSOLUTE COUNT: 0.2 10*3/uL — ABNORMAL HIGH (ref 0.0–0.1)
BASOPHILS ABSOLUTE COUNT: 0 10*3/uL (ref 0.0–0.2)
BASOPHILS RELATIVE PERCENT: 1 %
EOSINOPHILS ABSOLUTE COUNT: 0.1 10*3/uL (ref 0.0–0.4)
EOSINOPHILS RELATIVE PERCENT: 2 %
HEMATOCRIT: 34.4 % (ref 34.0–46.6)
HEMOGLOBIN: 10.9 g/dL — ABNORMAL LOW (ref 11.1–15.9)
IMMATURE GRANULOCYTES: 4 %
LYMPHOCYTES ABSOLUTE COUNT: 1 10*3/uL (ref 0.7–3.1)
LYMPHOCYTES RELATIVE PERCENT: 21 %
MEAN CORPUSCULAR HEMOGLOBIN CONC: 31.7 g/dL (ref 31.5–35.7)
MEAN CORPUSCULAR HEMOGLOBIN: 29.5 pg (ref 26.6–33.0)
MEAN CORPUSCULAR VOLUME: 93 fL (ref 79–97)
MONOCYTES ABSOLUTE COUNT: 0.7 10*3/uL (ref 0.1–0.9)
MONOCYTES RELATIVE PERCENT: 13 %
NEUTROPHILS ABSOLUTE COUNT: 3 10*3/uL (ref 1.4–7.0)
NEUTROPHILS RELATIVE PERCENT: 59 %
PLATELET COUNT: 233 10*3/uL (ref 150–450)
RED BLOOD CELL COUNT: 3.7 x10E6/uL — ABNORMAL LOW (ref 3.77–5.28)
RED CELL DISTRIBUTION WIDTH: 13.2 % (ref 11.7–15.4)
WHITE BLOOD CELL COUNT: 5 10*3/uL (ref 3.4–10.8)

## 2020-12-04 LAB — PHOSPHORUS: PHOSPHORUS, SERUM: 3.5 mg/dL (ref 3.0–4.3)

## 2020-12-04 LAB — MAGNESIUM: MAGNESIUM: 1.7 mg/dL (ref 1.6–2.3)

## 2020-12-05 LAB — TACROLIMUS LEVEL: TACROLIMUS BLOOD: 9 ng/mL (ref 2.0–20.0)

## 2020-12-09 DIAGNOSIS — Z940 Kidney transplant status: Principal | ICD-10-CM

## 2020-12-10 MED FILL — ENVARSUS XR 4 MG TABLET,EXTENDED RELEASE: ORAL | 30 days supply | Qty: 30 | Fill #9

## 2020-12-10 MED FILL — PREDNISONE 5 MG TABLET: ORAL | 30 days supply | Qty: 30 | Fill #11

## 2020-12-10 MED FILL — ENVARSUS XR 1 MG TABLET,EXTENDED RELEASE: ORAL | 30 days supply | Qty: 90 | Fill #9

## 2020-12-10 MED FILL — LEVETIRACETAM 250 MG TABLET: ORAL | 90 days supply | Qty: 180 | Fill #2

## 2020-12-10 MED FILL — SODIUM BICARBONATE 650 MG TABLET: ORAL | 30 days supply | Qty: 120 | Fill #10

## 2020-12-10 MED FILL — MG-PLUS-PROTEIN 133 MG TABLET: ORAL | 30 days supply | Qty: 120 | Fill #9

## 2020-12-16 DIAGNOSIS — Z940 Kidney transplant status: Principal | ICD-10-CM

## 2020-12-20 ENCOUNTER — Ambulatory Visit
Admit: 2020-12-20 | Discharge: 2020-12-21 | Payer: PRIVATE HEALTH INSURANCE | Attending: Nephrology | Primary: Nephrology

## 2020-12-20 ENCOUNTER — Ambulatory Visit: Admit: 2020-12-20 | Discharge: 2020-12-20 | Payer: PRIVATE HEALTH INSURANCE

## 2020-12-20 DIAGNOSIS — Z940 Kidney transplant status: Principal | ICD-10-CM

## 2020-12-20 LAB — HEPATIC FUNCTION PANEL
ALBUMIN: 4 g/dL (ref 3.4–5.0)
ALKALINE PHOSPHATASE: 52 U/L (ref 46–116)
ALT (SGPT): 14 U/L (ref 10–49)
AST (SGOT): 25 U/L (ref <=34)
BILIRUBIN DIRECT: 0.1 mg/dL (ref 0.00–0.30)
BILIRUBIN TOTAL: 0.3 mg/dL (ref 0.3–1.2)
PROTEIN TOTAL: 7.2 g/dL (ref 5.7–8.2)

## 2020-12-20 LAB — BASIC METABOLIC PANEL
ANION GAP: 7 mmol/L (ref 5–14)
BLOOD UREA NITROGEN: 30 mg/dL — ABNORMAL HIGH (ref 9–23)
BUN / CREAT RATIO: 29
CALCIUM: 10.3 mg/dL (ref 8.7–10.4)
CHLORIDE: 105 mmol/L (ref 98–107)
CO2: 26.2 mmol/L (ref 20.0–31.0)
CREATININE: 1.04 mg/dL — ABNORMAL HIGH
EGFR CKD-EPI (2021) FEMALE: 74 mL/min/{1.73_m2} (ref >=60)
GLUCOSE RANDOM: 88 mg/dL (ref 70–99)
POTASSIUM: 4.5 mmol/L (ref 3.4–4.8)
SODIUM: 138 mmol/L (ref 135–145)

## 2020-12-20 LAB — URINALYSIS
BILIRUBIN UA: NEGATIVE
GLUCOSE UA: NEGATIVE
KETONES UA: NEGATIVE
LEUKOCYTE ESTERASE UA: NEGATIVE
NITRITE UA: NEGATIVE
PH UA: 5.5 (ref 5.0–9.0)
PROTEIN UA: NEGATIVE
RBC UA: <1 /HPF (ref <4)
SPECIFIC GRAVITY UA: 1.025 (ref 1.005–1.030)
SQUAMOUS EPITHELIAL: 30 /HPF — ABNORMAL HIGH (ref 0–5)
UROBILINOGEN UA: 0.2
WBC UA: 2 /HPF (ref 0–5)

## 2020-12-20 LAB — CBC W/ AUTO DIFF
BASOPHILS ABSOLUTE COUNT: 0 10*9/L (ref 0.0–0.1)
BASOPHILS RELATIVE PERCENT: 1 %
EOSINOPHILS ABSOLUTE COUNT: 0.1 10*9/L (ref 0.0–0.5)
EOSINOPHILS RELATIVE PERCENT: 3.8 %
HEMATOCRIT: 34.8 % (ref 34.0–44.0)
HEMOGLOBIN: 11.5 g/dL (ref 11.3–14.9)
LYMPHOCYTES ABSOLUTE COUNT: 1.1 10*9/L (ref 1.1–3.6)
LYMPHOCYTES RELATIVE PERCENT: 31.5 %
MEAN CORPUSCULAR HEMOGLOBIN CONC: 33.1 g/dL (ref 32.0–36.0)
MEAN CORPUSCULAR HEMOGLOBIN: 30.4 pg (ref 25.9–32.4)
MEAN CORPUSCULAR VOLUME: 92 fL (ref 77.6–95.7)
MEAN PLATELET VOLUME: 7.5 fL (ref 6.8–10.7)
MONOCYTES ABSOLUTE COUNT: 0.5 10*9/L (ref 0.3–0.8)
MONOCYTES RELATIVE PERCENT: 14.6 %
NEUTROPHILS ABSOLUTE COUNT: 1.8 10*9/L (ref 1.8–7.8)
NEUTROPHILS RELATIVE PERCENT: 49.1 %
NUCLEATED RED BLOOD CELLS: 0 /100{WBCs} (ref <=4)
PLATELET COUNT: 226 10*9/L (ref 150–450)
RED BLOOD CELL COUNT: 3.78 10*12/L — ABNORMAL LOW (ref 3.95–5.13)
RED CELL DISTRIBUTION WIDTH: 14.1 % (ref 12.2–15.2)
WBC ADJUSTED: 3.6 10*9/L (ref 3.6–11.2)

## 2020-12-20 LAB — SLIDE REVIEW

## 2020-12-20 LAB — LIPID PANEL
CHOLESTEROL/HDL RATIO SCREEN: 4.5 (ref 1.0–4.5)
CHOLESTEROL: 157 mg/dL (ref <=200)
HDL CHOLESTEROL: 35 mg/dL — ABNORMAL LOW (ref 40–60)
LDL CHOLESTEROL CALCULATED: 102 mg/dL — ABNORMAL HIGH (ref 40–99)
NON-HDL CHOLESTEROL: 122 mg/dL (ref 70–130)
TRIGLYCERIDES: 100 mg/dL (ref 0–150)
VLDL CHOLESTEROL CAL: 20 mg/dL (ref 8–32)

## 2020-12-20 LAB — MAGNESIUM: MAGNESIUM: 1.8 mg/dL (ref 1.6–2.6)

## 2020-12-20 LAB — PROTEIN / CREATININE RATIO, URINE
CREATININE, URINE: 127.2 mg/dL
PROTEIN URINE: 16.2 mg/dL
PROTEIN/CREAT RATIO, URINE: 0.127

## 2020-12-20 LAB — TACROLIMUS LEVEL, TROUGH: TACROLIMUS, TROUGH: 8.3 ng/mL (ref 5.0–15.0)

## 2020-12-20 LAB — HEMOGLOBIN A1C
ESTIMATED AVERAGE GLUCOSE: 103 mg/dL
HEMOGLOBIN A1C: 5.2 % (ref 4.8–5.6)

## 2020-12-20 LAB — PARATHYROID HORMONE (PTH): PARATHYROID HORMONE INTACT: 88.1 pg/mL — ABNORMAL HIGH (ref 18.4–80.1)

## 2020-12-20 LAB — PHOSPHORUS: PHOSPHORUS: 3.5 mg/dL (ref 2.4–5.1)

## 2020-12-20 NOTE — Unmapped (Signed)
AOBP:right   arm large   cuff   Average:113/81  Pulse:89  1st reading: 115/83 Pulse:92  2nd reading:115/82 Pulse:88  3rd reading:109/78  Pulse:88

## 2020-12-20 NOTE — Unmapped (Signed)
Assessment    Met w/ patient in [facility] today. Reviewed meds/symptoms. Any new medications? No - she stopped Aspirin months ago. Reports she thinks she can stop Keppra soon, but has not heard from provider to do so.                Pt reports no fever/cold/flu symptoms    BP: 113/81 today/ Home BP reported stable. Checks with concerning symptoms   BG: wnl   Headache/Dizziness/Lightheaded: occasional headaches, Tylenol use 2-3x/wk. Relates HA to Envarsus   Hand tremors: minor, but not obtrusive to ADL.   Numbness/tingling: denies   Fevers/chills/sweats: denies   CP/SOB/palpatations: denies   Nausea/vomiting/heartburn: denies   Diarrhea/constipation: denies   UTI symptoms (burn/pain/itch/frequency/urgency/odor/color/foam): denies   No visible or palpable edema    Appetite good; reports adequate hydration. 60-80 oz/fluid per day.    Pt reports being well rested and getting adequate exercise despite Covid 19 quarantine. Working out 4 days/wk alternating strength and cardio. Taking care to mask, hand hygeine and minimal public activity. Offered support and guidance for this process given her immune suppressed state. Also discussed reduced covid vaccine coverage for transplant patients and importance of continuing to mask and practice safe distancing. Commented that a booster vaccine may be advised in the near future.     Last Envarsus taken 2310. Current dose 7mg  daily    Other complaints or concerns: inquired about birth control options    Referrals needed: none    Pt Follow up w/ PCP/Gyn re: birth control    Immunization status: will get Flu vaccine today and likely Covid Bivalent      Functional Score: 90     Able to carry on normal activity;  Minor signs or symptoms of disease.     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

## 2020-12-20 NOTE — Unmapped (Signed)
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 She is seen in follow up of her transplant and to address other medical problems. 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   - Genetics panel was negative for causative variants 08/30/20.     - Treatment included an increase in the dose of Myfortic to 720 mg bid and initiation of prednisone 5 mg daily  - Serum creatinine has remained stable at <1.3 mg/dl (5.18 mg/dL today)  - UPC has normalized (0.127 today) with peak value of 0.966 on 03/29/20. UA without hematuria today.    - Rituximab was deferred at the time of diagnosis due to an active COVID-19 community spike and concern about infection risk. With improvement on current immunosuppression we have not recently considered rituximab.  - Will prepare for use of Eculizumab in case current therapy or rituximab proves ineffective in the future. This will include completion of pre-eculizumab vaccination     - Repeat C3, C4, C3 glomerulopathy functional panel periodically  - Continue current immunosuppression for now. No plans for rituximab or eculizumab.    Immunosuppression Management [High Risk Medical Decision Making For Drug Therapy Requiring Intensive Monitoring For Toxicity]:   - Tacrolimus level 8.3 though this is not a trough level. (target trough is 6-10 ng/mL).   - Envarsus 7 mg daily dose to be continued pending next true trough level  - Continue Myfortic 720 mg bid due to C3 glomerulopathy recurrence  - Prednisone 5 mg daily due to C3 glomerulopathy recurrence, steroid free prior to C3G diagnosis    Infectious Prophylaxis and Monitoring: CMV & EBV: D+/R+  - PJP prophylaxis: completed (s/p Bactrim x 6 months)  - CMV prophylaxis: completed (s/p Valcyte x3 months).   - COVID-19 vaccination: completed 4 doses (dose #4 05/17/20). She is aware of the limited efficacy of this vaccine in transplant recipients. Will plan a bivalent booster to be done locally    - Received Evusheld 08/30/20  - Received Men B on 01/26/20 and 03/01/20  - Received MenACWY 01/26/20, second dose to be given at next visit.   - Booster doses of meningococcal vaccine every 1-5 years per CDC recommendations if chronic eculizumab therapy if is initiated.    Seizures Disorder, no seizures since 2014:    - EEG with no evidence of seizures on 06/25/20 .   - Keppra discontinuation being considered by Neurology team.  - She will follow up with [name], [facility] at [facility].       Myoclonus:   Will continue Klonopin, prescribed by PCP.  Continue [facility] physical therapy.      Metabolic Acidosis and Hyperkalemia, resolved with therapy  CO2 is now normal (26.2) and K is 4.6  Sodium bicarbonate 650 mg bid to be continued.    Pseudoaneurysm of AVF  She saw Dr. Norma Fredrickson 09/21/19 with plan to defer any intervention until 1 year has passed from transplant. She has no high output heart failure by echo or symptoms of CHF. She will be referred back to Dr. Norma Fredrickson. We would favor a nonurgent revision to minimize any  risk of rupture, but spare the AVF for future use if possible to do so safely. Patient has been advised to contact us with any scabbing or erosion of the skin over the pseudoaneurysm.    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    Pap Smear: 11/11/20 was negative including negative HPV  She will follow up with GYN and PCP     Follow up  3 months    History of Present Illness  Carolyn Kelley is a 30 y.o. 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 Her post-operative course was complicated by hypotension and hypoxia thought to be caused by an anaphylactic reaction to Campath. She has experienced no episodes of rejection or infectious complications. Due to presence of proteinuria she underwent kidney biopsy on 12/29/19. The biopsy showed C3 glomerulopathy recurrence. In response to this diagnosis Myfortic was increased from 540 mg bid to 720 mg bid and she was started on prednisone 5 mg daily while tacrolimus was continued.    She presents today stating she has headaches approximately 1 to 2 times per week and mild hand tremors. The headaches are typically responsive to Tylenol. She is studying psychology and this takes much of her time. She denies chest pain, shortness of breath, edema, fever, chills, cough, dysuria, or allograft tenderness. She has experienced no seizures. BP has been <130/80. Muscle spasms are controlled with Klonopin. She has stopped taking aspirin and is now taking 650 mg bid of sodium bicarbonate rather than 1300 mg bid.  She had a negative PAP smear on 10/22/20.     Last dose of Envarsus: 11 PM    Transplant History:  Date of Transplant: 03/10/19  Organ Received: Left DDKT, DBD, SCD, KDPI 2%; cold ischemia 18 hrs.  Native Kidney Disease: C3 glomerulonephropathy  Pre-transplant CPRA:  0%  Post-Transplant Course: Complicated by anaphylactic reaction to Campath in OR  Induction: Campath  Date of Ureteral Stent Removal: 04/17/19  CMV/EBV Status: CMV D+/R+, EBV D+/R+  Rejection Episodes: None  Donor Specific Antibodies: Negative (most recent 01/26/20)   Biopsy 12/29/19: C3 glomerulopathy recurrence, no rejection    Other Past Medical History  1. C3 Glomerulopathy  2. Seizure Disorder  3. 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 HTN  5. 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 ADHD    Review of Systems    Otherwise as per HPI, all other systems reviewed and are negative.    Medications  Current Outpatient Medications   Medication Sig Dispense Refill   ??? acetaminophen (TYLENOL) 500 MG tablet Take 1-2 tablets (500-1,000 mg total) by mouth every six (6) hours as needed for pain or fever (> 38C). 100 tablet 0   ??? aspirin (ECOTRIN) 81 MG tablet Take 1 tablet (81 mg total) by mouth daily. 30 tablet 11   ??? cholecalciferol, vitamin D3-125 mcg, 5,000 unit,, 125 mcg (5,000 unit) capsule Take 1 capsule (125 mcg total) by mouth daily. 100 capsule 11   ??? clonazePAM (KLONOPIN) 0.5 MG tablet Take 1 tablet (0.5 mg total) by mouth two (2) times a day. 60 tablet 3   ??? clonazePAM (KLONOPIN) 0.5 MG tablet Take 1 tablet by mouth Two (2) times a day.     ??? levETIRAcetam (KEPPRA) 250 MG tablet TAKE 1 TABLET BY  MOUTH TWICE DAILY. 180 tablet 3   ??? levETIRAcetam (KEPPRA) 250 MG tablet Take 1 tablet by mouth Two (2) times a day.     ??? magnesium oxide-Mg AA chelate (MAGNESIUM, AMINO ACID CHELATE,) 133 mg Take 2 tablets by mouth Two (2) times a day. 120 tablet 11   ??? mycophenolate (MYFORTIC) 180 MG EC tablet Take 4 tablets (720 mg total) by mouth Two (2) times a day. Adjust dose per medication card. 240 tablet 11   ??? predniSONE (DELTASONE) 5 MG tablet Take 1 tablet (5 mg total) by mouth daily. 30 tablet 11   ??? sodium bicarbonate 650 mg tablet Take 2 tablets (1,300 mg total) by mouth Two (2) times a day. 120 tablet 11   ??? tacrolimus (ENVARSUS XR) 1 mg Tb24 extended release tablet Take three (1mg ) tablets with one (4mg ) tablet by mouth daily for a total daily dose of 7 mg. 90 tablet 11   ??? tacrolimus (ENVARSUS XR) 4 mg Tb24 extended release tablet Take one (4mg ) tablet with three (1mg ) tablets by mouth daily for a total daily dose of 7 mg. 30 tablet 11   ??? tretinoin (RETIN-A) 0.025 % cream Start every other night and increase to nightly as tolerated 45 g 3     No current facility-administered medications for this visit.       Physical Exam  BP 113/81 (BP Site: R Arm, BP Position: Sitting, BP Cuff Size: Large)  - Pulse 89  - Temp 36.6 ??C (97.9 ??F) (Temporal)  - Ht 157.5 cm (5' 2)  - Wt 71.4 kg (157 lb 6.4 oz)  - LMP  (LMP Unknown)  - BMI 28.79 kg/m??    General: Patient is a pleasant female in no apparent distress.  Eyes: Sclera anicteric.  Neck: Supple without LAD/JVD/bruits.  Lungs: Clear to auscultation bilaterally, no wheezes/rales/rhonchi.  Cardiovascular: Regular rate and rhythm without murmurs, rubs or gallops.  Abdomen: Soft, notender/nondistended. Positive bowel sounds. No hepatosplenomegaly, masses or bruits appreciated.  Extremities: Without edema, joints without evidence of synovitis; pseudoaneurysm of AVF LUE  Skin: Without rash  Neurological: Grossly nonfocal; uses a walker for ambulation due to myoclonus  Psychiatric: Mood and affect appropriate.      Laboratory Results and imaging Reviewed in EMR

## 2020-12-22 LAB — HEPATITIS B SURFACE ANTIBODY
HEPATITIS B SURFACE ANTIBODY QUANT: 578.09 m[IU]/mL — ABNORMAL HIGH (ref <8.00)
HEPATITIS B SURFACE ANTIBODY: REACTIVE — AB

## 2020-12-22 LAB — CMV DNA, QUANTITATIVE, PCR: CMV VIRAL LD: NOT DETECTED

## 2020-12-22 LAB — HEPATITIS B CORE ANTIBODY, TOTAL: HEPATITIS B CORE TOTAL ANTIBODY: NONREACTIVE

## 2020-12-23 LAB — VITAMIN D 25 HYDROXY: VITAMIN D, TOTAL (25OH): 86.5 ng/mL — ABNORMAL HIGH (ref 20.0–80.0)

## 2020-12-23 LAB — EBV QUANTITATIVE PCR, BLOOD: EBV VIRAL LOAD RESULT: NOT DETECTED

## 2020-12-25 LAB — VITAMIN D 1,25 DIHYDROXY: VITAMIN D 1,25-DIHYDROXY: 29 pg/mL

## 2020-12-26 LAB — BK VIRUS QUANTITATIVE PCR, BLOOD: BK BLOOD RESULT: NOT DETECTED

## 2020-12-26 MED ORDER — SODIUM BICARBONATE 650 MG TABLET
ORAL_TABLET | Freq: Two times a day (BID) | ORAL | 11 refills | 60.00000 days | Status: CP
Start: 2020-12-26 — End: 2021-12-26
  Filled 2021-01-15: qty 120, 60d supply, fill #0

## 2020-12-27 LAB — FSAB CLASS 1 ANTIBODY SPECIFICITY: HLA CLASS 1 ANTIBODY RESULT: NEGATIVE

## 2020-12-27 LAB — HLA DS POST TRANSPLANT
ANTI-DONOR DRW #1 MFI: 73 MFI
ANTI-DONOR DRW #2 MFI: 73 MFI
ANTI-DONOR HLA-A #1 MFI: 0 MFI
ANTI-DONOR HLA-A #2 MFI: 0 MFI
ANTI-DONOR HLA-B #1 MFI: 38 MFI
ANTI-DONOR HLA-B #2 MFI: 0 MFI
ANTI-DONOR HLA-C #1 MFI: 77 MFI
ANTI-DONOR HLA-C #2 MFI: 222 MFI
ANTI-DONOR HLA-DP #2 MFI: 162 MFI
ANTI-DONOR HLA-DQB #1 MFI: 250 MFI
ANTI-DONOR HLA-DQB #2 MFI: 250 MFI
ANTI-DONOR HLA-DR #1 MFI: 9 MFI
ANTI-DONOR HLA-DR #2 MFI: 62 MFI

## 2020-12-27 LAB — FSAB CLASS 2 ANTIBODY SPECIFICITY: HLA CL2 AB RESULT: POSITIVE

## 2020-12-30 DIAGNOSIS — Z940 Kidney transplant status: Principal | ICD-10-CM

## 2020-12-30 MED FILL — ENVARSUS XR 1 MG TABLET,EXTENDED RELEASE: ORAL | 30 days supply | Qty: 90 | Fill #10

## 2020-12-30 NOTE — Unmapped (Addendum)
10/10 update: per cpp LC, pt ok to take med once it arrives tomorrow even if late and go back to normal dosing time the next day. Pt is aware. She also said she took 8mg  instead of 7mg  today since she didn't have the 1mg  tablets. I will let cpp know to reach out to her if that changes anything for tomorrow, otherwise patient said she had also left a message at clinic and would just take 7mg  tomorrow once rx arrives -[facility] Clinical Assessment & Refill Coordination Note    Carolyn Kelley, DOB: February 11, 1991  Phone: [phone] (home)     All above HIPAA information was verified with patient.     Was a Nurse, learning disability used for this call? No    Specialty Medication(s):   Transplant: Envarsus 1mg , Envarsus 4mg ,  mycophenolic acid 180mg  and Prednisone 5mg      Current Outpatient Medications   Medication Sig Dispense Refill   ??? acetaminophen (TYLENOL) 500 MG tablet Take 1-2 tablets (500-1,000 mg total) by mouth every six (6) hours as needed for pain or fever (> 38C). 100 tablet 0   ??? cholecalciferol, vitamin D3-125 mcg, 5,000 unit,, 125 mcg (5,000 unit) capsule Take 1 capsule (125 mcg total) by mouth daily. 100 capsule 11   ??? clonazePAM (KLONOPIN) 0.5 MG tablet Take 1 tablet (0.5 mg total) by mouth two (2) times a day. 60 tablet 3   ??? levETIRAcetam (KEPPRA) 250 MG tablet TAKE 1 TABLET BY MOUTH TWICE DAILY. 180 tablet 3   ??? magnesium oxide-Mg AA chelate (MAGNESIUM, AMINO ACID CHELATE,) 133 mg Take 2 tablets by mouth Two (2) times a day. 120 tablet 11   ??? mycophenolate (MYFORTIC) 180 MG EC tablet Take 4 tablets (720 mg total) by mouth Two (2) times a day. Adjust dose per medication card. 240 tablet 11   ??? predniSONE (DELTASONE) 5 MG tablet Take 1 tablet (5 mg total) by mouth daily. 30 tablet 11   ??? sodium bicarbonate 650 mg tablet Take 1 tablet (650 mg total) by mouth Two (2) times a day. 120 tablet 11   ??? tacrolimus (ENVARSUS XR) 1 mg Tb24 extended release tablet Take three (1mg ) tablets with one (4mg ) tablet by mouth daily for a total daily dose of 7 mg. 90 tablet 11   ??? tacrolimus (ENVARSUS XR) 4 mg Tb24 extended release tablet Take one (4mg ) tablet with three (1mg ) tablets by mouth daily for a total daily dose of 7 mg. 30 tablet 11   ??? tretinoin (RETIN-A) 0.025 % cream Start every other night and increase to nightly as tolerated 45 g 3     No current facility-administered medications for this visit.        Changes to medications: Lisa reports no changes at this time.    Allergies   Allergen Reactions   ??? Bee Pollen Swelling     Sneezing, congestion  denies swelling.  Sneezing, congestion  denies swelling.     ??? Pollen Extracts Swelling       Changes to allergies: No    SPECIALTY MEDICATION ADHERENCE     envarsus 1mg   : 0 days of medicine on hand   envarsus 4mg   : 15 days of medicine on hand   Mycophenolate 180mg   : 15 days of medicine on hand   Prednisone 5mg   : 15 days of medicine on hand       Medication Adherence    Patient reported X missed  doses in the last month: 0  Specialty Medication: envarsus 1mg   Patient is on additional specialty medications: Yes  Additional Specialty Medications: Envarsus 4mg   Patient Reported Additional Medication X Missed Doses in the Last Month: 0  Patient is on more than two specialty medications: Yes  Specialty Medication: mycophenolate 180mg   Patient Reported Additional Medication X Missed Doses in the Last Month: 0  Specialty Medication: prednisone 5mg   Patient Reported Additional Medication X Missed Doses in the Last Month: 0          Specialty medication(s) dose(s) confirmed: Regimen is correct and unchanged.     Are there any concerns with adherence? No    Adherence counseling provided? Not needed    CLINICAL MANAGEMENT AND INTERVENTION      Clinical Benefit Assessment:    Do you feel the medicine is effective or helping your condition? Yes    Clinical Benefit counseling provided? Not needed    Adverse Effects Assessment:    Are you experiencing any side effects? No    Are you experiencing difficulty administering your medicine? No    Quality of Life Assessment:         How many days over the past month did your transplant  keep you from your normal activities? For example, brushing your teeth or getting up in the morning. 0    Have you discussed this with your provider? Not needed    Acute Infection Status:    Acute infections noted within Epic:  No active infections  Patient reported infection: None    Therapy Appropriateness:    Is therapy appropriate and patient progressing towards therapeutic goals? Yes, therapy is appropriate and should be continued    DISEASE/MEDICATION-SPECIFIC INFORMATION      N/A    PATIENT SPECIFIC NEEDS     - Does the patient have any physical, cognitive, or cultural barriers? No    - Is the patient high risk? Yes, patient is taking a REMS drug. Medication is dispensed in compliance with REMS program    - Does the patient require a Care Management Plan? No     - Does the patient require physician intervention or other additional services (i.e. nutrition, smoking cessation, social work)? No      SHIPPING     Specialty Medication(s) to be Shipped:   Transplant: Envarsus 1mg     Other medication(s) to be shipped: No additional medications requested for fill at this time   Pt wants call back in 1 week on other meds     Changes to insurance: No    Delivery Scheduled: Yes, Expected medication delivery date: 12/31/2020.     Medication will be delivered via UPS to the confirmed prescription address in [facility].    The patient will receive a drug information handout for each medication shipped and additional FDA Medication Guides as required.  Verified that patient has previously received a Conservation officer, historic buildings and a Surveyor, mining.    The patient or caregiver noted above participated in the development of this care plan and knows that they can request review of or adjustments to the care plan at any time.      All of the patient's questions and concerns have been addressed.    Thad Ranger   [facility] Specialty Pharmacist

## 2021-01-01 LAB — CBC W/ DIFFERENTIAL
BANDED NEUTROPHILS ABSOLUTE COUNT: 0.2 10*3/uL — ABNORMAL HIGH (ref 0.0–0.1)
BASOPHILS ABSOLUTE COUNT: 0 10*3/uL (ref 0.0–0.2)
BASOPHILS RELATIVE PERCENT: 1 %
EOSINOPHILS ABSOLUTE COUNT: 0.1 10*3/uL (ref 0.0–0.4)
EOSINOPHILS RELATIVE PERCENT: 2 %
HEMATOCRIT: 33.9 % — ABNORMAL LOW (ref 34.0–46.6)
HEMOGLOBIN: 10.9 g/dL — ABNORMAL LOW (ref 11.1–15.9)
IMMATURE GRANULOCYTES: 5 %
LYMPHOCYTES ABSOLUTE COUNT: 1.1 10*3/uL (ref 0.7–3.1)
LYMPHOCYTES RELATIVE PERCENT: 26 %
MEAN CORPUSCULAR HEMOGLOBIN CONC: 32.2 g/dL (ref 31.5–35.7)
MEAN CORPUSCULAR HEMOGLOBIN: 29.9 pg (ref 26.6–33.0)
MEAN CORPUSCULAR VOLUME: 93 fL (ref 79–97)
MONOCYTES ABSOLUTE COUNT: 0.5 10*3/uL (ref 0.1–0.9)
MONOCYTES RELATIVE PERCENT: 12 %
NEUTROPHILS ABSOLUTE COUNT: 2.2 10*3/uL (ref 1.4–7.0)
NEUTROPHILS RELATIVE PERCENT: 54 %
PLATELET COUNT: 213 10*3/uL (ref 150–450)
RED BLOOD CELL COUNT: 3.64 x10E6/uL — ABNORMAL LOW (ref 3.77–5.28)
RED CELL DISTRIBUTION WIDTH: 13 % (ref 11.7–15.4)
WHITE BLOOD CELL COUNT: 4.1 10*3/uL (ref 3.4–10.8)

## 2021-01-01 LAB — BASIC METABOLIC PANEL
BLOOD UREA NITROGEN: 30 mg/dL — ABNORMAL HIGH (ref 6–20)
BUN / CREAT RATIO: 28 — ABNORMAL HIGH (ref 9–23)
CALCIUM: 10.2 mg/dL (ref 8.7–10.2)
CHLORIDE: 104 mmol/L (ref 96–106)
CO2: 23 mmol/L (ref 20–29)
CREATININE: 1.07 mg/dL — ABNORMAL HIGH (ref 0.57–1.00)
EGFR: 71 mL/min/{1.73_m2}
GLUCOSE: 86 mg/dL (ref 70–99)
POTASSIUM: 4.9 mmol/L (ref 3.5–5.2)
SODIUM: 138 mmol/L (ref 134–144)

## 2021-01-01 LAB — MAGNESIUM: MAGNESIUM: 1.6 mg/dL (ref 1.6–2.3)

## 2021-01-01 LAB — PHOSPHORUS: PHOSPHORUS, SERUM: 3.7 mg/dL (ref 3.0–4.3)

## 2021-01-02 LAB — TACROLIMUS LEVEL: TACROLIMUS BLOOD: 8.9 ng/mL (ref 2.0–20.0)

## 2021-01-08 ENCOUNTER — Encounter: Payer: Self-pay | Admitting: Physical Medicine & Rehabilitation

## 2021-01-08 ENCOUNTER — Encounter: Payer: Medicaid Other | Attending: Physical Medicine & Rehabilitation | Admitting: Physical Medicine & Rehabilitation

## 2021-01-08 ENCOUNTER — Other Ambulatory Visit: Payer: Self-pay

## 2021-01-08 VITALS — BP 131/82 | HR 85 | Temp 97.9°F | Ht 62.0 in | Wt 156.0 lb

## 2021-01-08 DIAGNOSIS — F900 Attention-deficit hyperactivity disorder, predominantly inattentive type: Secondary | ICD-10-CM | POA: Diagnosis not present

## 2021-01-08 DIAGNOSIS — F988 Other specified behavioral and emotional disorders with onset usually occurring in childhood and adolescence: Secondary | ICD-10-CM | POA: Insufficient documentation

## 2021-01-08 DIAGNOSIS — G931 Anoxic brain damage, not elsewhere classified: Secondary | ICD-10-CM | POA: Diagnosis not present

## 2021-01-08 MED ORDER — METHYLPHENIDATE HCL 5 MG PO TABS: 2.5000 mg | ORAL_TABLET | Freq: Two times a day (BID) | ORAL | 0 refills | Status: AC

## 2021-01-08 NOTE — Progress Notes (Signed)
Subjective:    Patient ID: Heather Sims, female    DOB: 1990-07-18, 30 y.o.   MRN: [id number]  HPI  Heather Sims is here in follow up of her ABI and myoclonus. 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 She is doing on-line learning currently but will begin in person next semester. She is taking 3 classes which at times has been overwhelming for her. She's getting all A's currently! She is currently living with her parents. She feels that she struggles with short term memory and recall.  I asked her if she felt the focus was a problem while she was listening to a lecture or trying to read a book and she said it was.  Is also a problem when she was trying to write her papers.  She does drink some coffee which helps but she does feel at times it makes her jittery.  She is a wakes up around 11 AM most mornings and then does a bit of a workout on the stationary bike.  She typically have some breakfast before she exercises.  She will do her class work and do her classes usually after 2 PM.  Pain Inventory Average Pain 0 Pain Right Now 0 My pain is  no pain  In the last 24 hours, has pain interfered with the following? General activity 0 Relation with others 0 Enjoyment of life 0 What TIME of day is your pain at its worst? na Sleep (in general) Fair  Pain is worse with:  no pain Pain improves with:  no pain Relief from Meds:  no pain  Family History  Problem Relation Age of Onset   Hypertension Mother    Diabetes Father    Hyperlipidemia Father    Social History   Socioeconomic History   Marital status: Single    Spouse name: Not on file   Number of children: 0   Years of education: college jr   Highest education level: Not on file  Occupational History   Not on file  Tobacco Use   Smoking status: Never   Smokeless tobacco: Never  Vaping Use   Vaping Use: Never used  Substance and Sexual Activity   Alcohol use: No    Alcohol/week: 0.0  standard drinks   Drug use: No   Sexual activity: Never    Birth control/protection: Abstinence  Other Topics Concern   Not on file  Social History Narrative   Lives at home with her mother & brother   Right handed      ** Merged History Encounter **       Social Determinants of Health   Financial Resource Strain: Not on file  Food Insecurity: Not on file  Transportation Needs: Not on file  Physical Activity: Not on file  Stress: Not on file  Social Connections: Not on file   Past Surgical History:  Procedure Laterality Date   AV FISTULA PLACEMENT  12/18/2011   Procedure: ARTERIOVENOUS (AV) FISTULA CREATION;  Surgeon: Angelia Mould, MD;  Location: [place];  Service: Vascular;  Laterality: Left;   INSERTION OF DIALYSIS CATHETER Right 08/06/2014   Procedure: INSERTION OF DIALYSIS CATHETER;  Surgeon: Elam Dutch, MD;  Location: [place];  Service: Vascular;  Laterality: Right;   RENAL BIOPSY     REVISON OF ARTERIOVENOUS FISTULA Left 09/30/6267   Procedure: PLICATION OF ARTERIOVENOUS FISTULA;  Surgeon: Elam Dutch, MD;  Location: [place];  Service:  Vascular;  Laterality: Left;   REVISON OF ARTERIOVENOUS FISTULA Left 03/12/2017   Procedure: REVISION PLICATION OF ARTERIOVENOUS FISTULA LEFT UPPER ARM;  Surgeon: Serafina Mitchell, MD;  Location: [place];  Service: Vascular;  Laterality: Left;   Past Surgical History:  Procedure Laterality Date   AV FISTULA PLACEMENT  12/18/2011   Procedure: ARTERIOVENOUS (AV) FISTULA CREATION;  Surgeon: Angelia Mould, MD;  Location: [place];  Service: Vascular;  Laterality: Left;   INSERTION OF DIALYSIS CATHETER Right 08/06/2014   Procedure: INSERTION OF DIALYSIS CATHETER;  Surgeon: Elam Dutch, MD;  Location: [place];  Service: Vascular;  Laterality: Right;   RENAL BIOPSY     REVISON OF ARTERIOVENOUS FISTULA Left 3/82/5053   Procedure: PLICATION OF ARTERIOVENOUS FISTULA;  Surgeon: Elam Dutch, MD;  Location: [facility];  Service:  Vascular;  Laterality: Left;   REVISON OF ARTERIOVENOUS FISTULA Left 03/12/2017   Procedure: REVISION PLICATION OF ARTERIOVENOUS FISTULA LEFT UPPER ARM;  Surgeon: Serafina Mitchell, MD;  Location: [place];  Service: Vascular;  Laterality: Left;   Past Medical History:  Diagnosis Date   Anemia    Brain injury ([place])    Cardiac arrest ([place]) 03/26/2011   Cardiomyopathy    CHF (congestive heart failure) ([place])    Chronic kidney disease    TThS- [place]   Depression    Family history of adverse reaction to anesthesia    Mother hard to wake up.   Gait disorder 01/11/2013   Heart failure    History of blood transfusion 11/2011   Hypertension    Ileitis    MPGN (membranoproliferative glomerulonephritis), type 2    Pneumonia    'walking pneumonia'   Renal disorder    Seizures ([place])    2013- last time   BP 131/82   Pulse 85   Temp 97.9 F (36.6 C) (Oral)   Ht 5\' 2"  (1.575 m)   Wt 156 lb (70.8 kg)   SpO2 100%   BMI 28.53 kg/m   Opioid Risk Score:   Fall Risk Score:  `1  Depression screen PHQ 2/9  Depression screen [facility] 2/9 10/18/2017 07/02/2014  Decreased Interest 1 1  Down, Depressed, Hopeless 1 1  PHQ - 2 Score 2 2  Altered sleeping - 1  Tired, decreased energy - 1  Change in appetite - 0  Feeling bad or failure about yourself  - 2  Trouble concentrating - 0  Moving slowly or fidgety/restless - 0  Suicidal thoughts - 0  PHQ-9 Score - 6    Review of Systems  All other systems reviewed and are negative.     Objective:   Physical Exam   General: No acute distress HEENT: NCAT, EOMI, oral membranes moist Cards: reg rate  Chest: normal effort Abdomen: Soft, NT, ND Skin: dry, intact Extremities: no edema Psych: pleasant and appropriate. More engaging Musculoskeletal:     .MUSCO Skin:    General: Skin is warm and dry.  Neurological:     Mental Status: She is alert and oriented to person, place, and time.very engaging. Improved insight and awareness.  GAIT very steady  with walker. No clonus today. Balance wexcellent. Strength 5/5                   Assessment & Plan:  1. 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 2. Myoclonus: Continue Klonopin 0.5 mg BID. No RX needed  -may be able to dc  soon? Controlled Substance Reporting system. 3. Gait Disorder: RX:  : Continue walking with walker   4. Seizure Disorder: Neurology Following. Continue to Monitor.  5. ESRD: S/P Transplant: Nephrology Following.  6. Attention deficits, memory dysfunction  -trial of ritalin 2.5-5mg  qd to bid with food  -no coffee with ritalin  -discussed working on one thing at a time, avoid mutli-tasking  -adequate sleep   24  minutes of face to face patient care time was spent during this visit. All questions were encouraged and answered. SCAT paperwork was completed today. F/u with me in 2 mos

## 2021-01-08 NOTE — Patient Instructions (Addendum)
DO NOT TAKE RITALIN AND COFFEE TOGETHER.   IF YOU FEEL JITTERY WITH THE 5MG  DOSE, THEN CUT IN HALF TO 2.5MG 

## 2021-01-13 DIAGNOSIS — Z940 Kidney transplant status: Principal | ICD-10-CM

## 2021-01-13 MED ORDER — PREDNISONE 5 MG TABLET
ORAL_TABLET | Freq: Every day | ORAL | 11 refills | 30.00000 days | Status: CP
Start: 2021-01-13 — End: ?
  Filled 2021-01-15: qty 30, 30d supply, fill #0

## 2021-01-13 NOTE — Unmapped (Signed)
Pt request for RX Refill

## 2021-01-13 NOTE — Unmapped (Signed)
[facility] Refill Coordination Note    Specialty Medication(s) to be Shipped:   Transplant: Envarsus 1mg , Envarsus 4mg , mycophenolate mofetil 180mg  and Prednisone 4mg     Other medication(s) to be shipped: magnesium, sodium bicarb and vitamin D     Carolyn Kelley, DOB: 18-Jun-1990  Phone: [phone] (home)       All above HIPAA information was verified with patient.     Was a Nurse, learning disability used for this call? No    Completed refill call assessment today to schedule patient's medication shipment from the [facility] [phone]).  All relevant notes have been reviewed.     Specialty medication(s) and dose(s) confirmed: Regimen is correct and unchanged.   Changes to medications: Ronica reports no changes at this time.  Changes to insurance: No  New side effects reported not previously addressed with a pharmacist or physician: None reported  Questions for the pharmacist: No    Confirmed patient received a Conservation officer, historic buildings and a Surveyor, mining with first shipment. The patient will receive a drug information handout for each medication shipped and additional FDA Medication Guides as required.       DISEASE/MEDICATION-SPECIFIC INFORMATION        N/A    SPECIALTY MEDICATION ADHERENCE     Medication Adherence    Patient reported X missed doses in the last month: 0  Specialty Medication: ENVARSUS XR 4 mg Tb24 extended release tablet (tacrolimus)  Patient is on additional specialty medications: Yes  Additional Specialty Medications: mycophenolate 180 MG EC tablet (MYFORTIC)  Patient Reported Additional Medication X Missed Doses in the Last Month: 0  Patient is on more than two specialty medications: Yes  Specialty Medication: Envarsus 1mg   Patient Reported Additional Medication X Missed Doses in the Last Month: 0  Specialty Medication: Prednisone 5mg   Patient Reported Additional Medication X Missed Doses in the Last Month: 0        Were doses missed due to medication being on hold? No    Envarsus 1 mg: 14 days of medicine on hand   Envarsus 4 mg: 4 days of medicine on hand   Mycophenolate 180 mg: 4 days of medicine on hand   Prednisone 5 mg: 4 days of medicine on hand     REFERRAL TO PHARMACIST     Referral to the pharmacist: Not needed      SHIPPING     Shipping address confirmed in Epic.     Delivery Scheduled: Yes, Expected medication delivery date: 01/16/2021.      **Envarsus 1mg  delivery on 01/27/2021**    Medication will be delivered via UPS to the prescription address in Epic WAM.    Carolyn Kelley [facility] Specialty Technician

## 2021-01-15 LAB — BASIC METABOLIC PANEL
BLOOD UREA NITROGEN: 37 mg/dL — ABNORMAL HIGH (ref 6–20)
BUN / CREAT RATIO: 31 — ABNORMAL HIGH (ref 9–23)
CALCIUM: 10.6 mg/dL — ABNORMAL HIGH (ref 8.7–10.2)
CHLORIDE: 105 mmol/L (ref 96–106)
CO2: 21 mmol/L (ref 20–29)
CREATININE: 1.2 mg/dL — ABNORMAL HIGH (ref 0.57–1.00)
EGFR: 62 mL/min/{1.73_m2}
GLUCOSE: 90 mg/dL (ref 70–99)
POTASSIUM: 4.9 mmol/L (ref 3.5–5.2)
SODIUM: 137 mmol/L (ref 134–144)

## 2021-01-15 LAB — CBC W/ DIFFERENTIAL
BANDED NEUTROPHILS ABSOLUTE COUNT: 0.2 10*3/uL — ABNORMAL HIGH (ref 0.0–0.1)
BASOPHILS ABSOLUTE COUNT: 0 10*3/uL (ref 0.0–0.2)
BASOPHILS RELATIVE PERCENT: 1 %
EOSINOPHILS ABSOLUTE COUNT: 0 10*3/uL (ref 0.0–0.4)
EOSINOPHILS RELATIVE PERCENT: 1 %
HEMATOCRIT: 36 % (ref 34.0–46.6)
HEMOGLOBIN: 11.4 g/dL (ref 11.1–15.9)
IMMATURE GRANULOCYTES: 5 %
LYMPHOCYTES ABSOLUTE COUNT: 1 10*3/uL (ref 0.7–3.1)
LYMPHOCYTES RELATIVE PERCENT: 21 %
MEAN CORPUSCULAR HEMOGLOBIN CONC: 31.7 g/dL (ref 31.5–35.7)
MEAN CORPUSCULAR HEMOGLOBIN: 29.7 pg (ref 26.6–33.0)
MEAN CORPUSCULAR VOLUME: 94 fL (ref 79–97)
MONOCYTES ABSOLUTE COUNT: 0.6 10*3/uL (ref 0.1–0.9)
MONOCYTES RELATIVE PERCENT: 12 %
NEUTROPHILS ABSOLUTE COUNT: 2.9 10*3/uL (ref 1.4–7.0)
NEUTROPHILS RELATIVE PERCENT: 60 %
PLATELET COUNT: 220 10*3/uL (ref 150–450)
RED BLOOD CELL COUNT: 3.84 x10E6/uL (ref 3.77–5.28)
RED CELL DISTRIBUTION WIDTH: 13.2 % (ref 11.7–15.4)
WHITE BLOOD CELL COUNT: 4.8 10*3/uL (ref 3.4–10.8)

## 2021-01-15 LAB — MAGNESIUM: MAGNESIUM: 1.8 mg/dL (ref 1.6–2.3)

## 2021-01-15 LAB — PHOSPHORUS: PHOSPHORUS, SERUM: 3.7 mg/dL (ref 3.0–4.3)

## 2021-01-15 MED FILL — MG-PLUS-PROTEIN 133 MG TABLET: ORAL | 30 days supply | Qty: 120 | Fill #10

## 2021-01-15 MED FILL — CHOLECALCIFEROL (VITAMIN D3) 125 MCG (5,000 UNIT) CAPSULE: ORAL | 100 days supply | Qty: 100 | Fill #2

## 2021-01-15 MED FILL — MYCOPHENOLATE SODIUM 180 MG TABLET,DELAYED RELEASE: ORAL | 30 days supply | Qty: 240 | Fill #1

## 2021-01-15 MED FILL — ENVARSUS XR 4 MG TABLET,EXTENDED RELEASE: ORAL | 30 days supply | Qty: 30 | Fill #10

## 2021-01-16 LAB — TACROLIMUS LEVEL: TACROLIMUS BLOOD: 10.5 ng/mL (ref 2.0–20.0)

## 2021-01-22 NOTE — Unmapped (Signed)
Returned call to patient.  Per patient, her chin area is starting to break out again like before.  Using everything as prescribed .  Her concern was that her current tube of tretinoin has a small slit in it in the middle of the tube and some of the cream comes out of the slit when she squeezes tube.  Patient wants to know if that could be why her chin is starting to break out again.    Is getting new tube refilled now but it will take 3 days.  May have to change pharmacies since using goodRx.

## 2021-01-22 NOTE — Unmapped (Signed)
Tried to call patient to recommend a return visit per Dr. Carles Collet but patient was not available.  Phone kept ringing without going to voicemail.

## 2021-01-23 NOTE — Unmapped (Signed)
Sent mychart message.  Thanks.

## 2021-01-24 MED FILL — ENVARSUS XR 1 MG TABLET,EXTENDED RELEASE: ORAL | 30 days supply | Qty: 90 | Fill #11

## 2021-01-27 DIAGNOSIS — Z940 Kidney transplant status: Principal | ICD-10-CM

## 2021-01-29 LAB — CBC W/ DIFFERENTIAL
BANDED NEUTROPHILS ABSOLUTE COUNT: 0.1 10*3/uL (ref 0.0–0.1)
BASOPHILS ABSOLUTE COUNT: 0 10*3/uL (ref 0.0–0.2)
BASOPHILS RELATIVE PERCENT: 1 %
EOSINOPHILS ABSOLUTE COUNT: 0.1 10*3/uL (ref 0.0–0.4)
EOSINOPHILS RELATIVE PERCENT: 2 %
HEMATOCRIT: 33.7 % — ABNORMAL LOW (ref 34.0–46.6)
HEMOGLOBIN: 10.7 g/dL — ABNORMAL LOW (ref 11.1–15.9)
IMMATURE GRANULOCYTES: 3 %
LYMPHOCYTES ABSOLUTE COUNT: 0.9 10*3/uL (ref 0.7–3.1)
LYMPHOCYTES RELATIVE PERCENT: 24 %
MEAN CORPUSCULAR HEMOGLOBIN CONC: 31.8 g/dL (ref 31.5–35.7)
MEAN CORPUSCULAR HEMOGLOBIN: 29.3 pg (ref 26.6–33.0)
MEAN CORPUSCULAR VOLUME: 92 fL (ref 79–97)
MONOCYTES ABSOLUTE COUNT: 0.5 10*3/uL (ref 0.1–0.9)
MONOCYTES RELATIVE PERCENT: 13 %
NEUTROPHILS ABSOLUTE COUNT: 2 10*3/uL (ref 1.4–7.0)
NEUTROPHILS RELATIVE PERCENT: 57 %
PLATELET COUNT: 211 10*3/uL (ref 150–450)
RED BLOOD CELL COUNT: 3.65 x10E6/uL — ABNORMAL LOW (ref 3.77–5.28)
RED CELL DISTRIBUTION WIDTH: 13.1 % (ref 11.7–15.4)
WHITE BLOOD CELL COUNT: 3.6 10*3/uL (ref 3.4–10.8)

## 2021-01-29 LAB — BASIC METABOLIC PANEL
BLOOD UREA NITROGEN: 28 mg/dL — ABNORMAL HIGH (ref 6–20)
BUN / CREAT RATIO: 25 — ABNORMAL HIGH (ref 9–23)
CALCIUM: 10.2 mg/dL (ref 8.7–10.2)
CHLORIDE: 106 mmol/L (ref 96–106)
CO2: 20 mmol/L (ref 20–29)
CREATININE: 1.14 mg/dL — ABNORMAL HIGH (ref 0.57–1.00)
EGFR: 66 mL/min/{1.73_m2}
GLUCOSE: 96 mg/dL (ref 70–99)
POTASSIUM: 4.8 mmol/L (ref 3.5–5.2)
SODIUM: 137 mmol/L (ref 134–144)

## 2021-01-29 LAB — MAGNESIUM: MAGNESIUM: 1.7 mg/dL (ref 1.6–2.3)

## 2021-01-29 LAB — PHOSPHORUS: PHOSPHORUS, SERUM: 3.4 mg/dL (ref 3.0–4.3)

## 2021-01-30 LAB — TACROLIMUS LEVEL: TACROLIMUS BLOOD: 8.1 ng/mL (ref 2.0–20.0)

## 2021-02-06 NOTE — Unmapped (Signed)
[facility] Refill Coordination Note    Specialty Medication(s) to be Shipped:   Transplant: Envarsus 4mg , mycophenolate mofetil 180mg  and Prednisone 5mg     Other medication(s) to be shipped: magnesium     Carolyn Kelley, DOB: February 03, 1991  Phone: [phone] (home)       All above HIPAA information was verified with patient.     Was a Nurse, learning disability used for this call? No    Completed refill call assessment today to schedule patient's medication shipment from the [facility] [phone]).  All relevant notes have been reviewed.     Specialty medication(s) and dose(s) confirmed: Regimen is correct and unchanged.   Changes to medications: Shanara reports no changes at this time.  Changes to insurance: No  New side effects reported not previously addressed with a pharmacist or physician: None reported  Questions for the pharmacist: No    Confirmed patient received a Conservation officer, historic buildings and a Surveyor, mining with first shipment. The patient will receive a drug information handout for each medication shipped and additional FDA Medication Guides as required.       DISEASE/MEDICATION-SPECIFIC INFORMATION        N/A    SPECIALTY MEDICATION ADHERENCE     Medication Adherence    Patient reported X missed doses in the last month: 0  Specialty Medication: Envarsus 4mg   Patient is on additional specialty medications: Yes  Additional Specialty Medications: Mycophenolate 180mg   Patient Reported Additional Medication X Missed Doses in the Last Month: 0  Patient is on more than two specialty medications: Yes  Specialty Medication: Prednisone 5mg   Patient Reported Additional Medication X Missed Doses in the Last Month: 0        Were doses missed due to medication being on hold? No    Envarsus 4 mg: 8 days of medicine on hand   Mycophenolate 180 mg: 8 days of medicine on hand   Prednisone 5 mg: 8 days of medicine on hand     REFERRAL TO PHARMACIST     Referral to the pharmacist: Not needed      [facility]     Shipping address confirmed in Epic.     Delivery Scheduled: Yes, Expected medication delivery date: 02/12/2021.     Medication will be delivered via UPS to the prescription address in Epic WAM.    [name] [facility] Specialty Technician

## 2021-02-06 NOTE — Unmapped (Signed)
Patient called and states she was prescribed ritalin to help her focus in school and not feel overwhelmed.    Discussed with Vernona Rieger PharmD who states that is OK    Called and updated patient

## 2021-02-10 DIAGNOSIS — Z940 Kidney transplant status: Principal | ICD-10-CM

## 2021-02-10 NOTE — Unmapped (Signed)
Returned patient's voicemail requesting call back. Patient wanted to know what the best protein powder is for her post transplant now that she is strenuously exercising. She is participating in cardiovascular and strength training exercise 5 days per week.   Discussed different protein powders that were appropriate post transplant.   Patient has started drinking 2 smoothies per week to make sure she gets enough fruit as she does not like fruit on its own. She asked about cacao nibs in her smoothies, shared that those would be acceptable to eat post transplant. She also adds flax seed and chia seeds to her smoothies. Patient reports she does not drink much water on her off work out days, she does drink 76 fl oz of water when she works out. She reports weighing 148 lbs at home, so has lost ~15 lbs since last call. She has been continued to use the app, she is now eating 1693 calories per day which is a slight decrease from last time.   Encouraged patient to restrict calories when strenuously exercising so she is able to build muscle like she wishes. Her goal is to hit 140 lbs and maintain her weight in the 140's and build muscle.     Lanelle Bal, RD, LDN, CCTD  Abdominal Transplant Dietitian   Pager: [phone]

## 2021-02-11 MED FILL — PREDNISONE 5 MG TABLET: ORAL | 30 days supply | Qty: 30 | Fill #1

## 2021-02-11 MED FILL — ENVARSUS XR 4 MG TABLET,EXTENDED RELEASE: ORAL | 30 days supply | Qty: 30 | Fill #11

## 2021-02-11 MED FILL — MYCOPHENOLATE SODIUM 180 MG TABLET,DELAYED RELEASE: ORAL | 30 days supply | Qty: 240 | Fill #2

## 2021-02-11 MED FILL — MG-PLUS-PROTEIN 133 MG TABLET: ORAL | 30 days supply | Qty: 120 | Fill #11

## 2021-02-12 LAB — CBC W/ DIFFERENTIAL
BASOPHILS ABSOLUTE COUNT: 0 10*3/uL (ref 0.0–0.2)
BASOPHILS RELATIVE PERCENT: 1 %
EOSINOPHILS ABSOLUTE COUNT: 0 10*3/uL (ref 0.0–0.4)
EOSINOPHILS RELATIVE PERCENT: 1 %
HEMATOCRIT: 33.4 % — ABNORMAL LOW (ref 34.0–46.6)
HEMOGLOBIN: 10.7 g/dL — ABNORMAL LOW (ref 11.1–15.9)
LYMPHOCYTES ABSOLUTE COUNT: 0.9 10*3/uL (ref 0.7–3.1)
LYMPHOCYTES RELATIVE PERCENT: 25 %
MEAN CORPUSCULAR HEMOGLOBIN CONC: 32 g/dL (ref 31.5–35.7)
MEAN CORPUSCULAR HEMOGLOBIN: 29.8 pg (ref 26.6–33.0)
MEAN CORPUSCULAR VOLUME: 93 fL (ref 79–97)
MONOCYTES ABSOLUTE COUNT: 0.5 10*3/uL (ref 0.1–0.9)
MONOCYTES RELATIVE PERCENT: 14 %
NEUTROPHILS ABSOLUTE COUNT: 2.1 10*3/uL (ref 1.4–7.0)
NEUTROPHILS RELATIVE PERCENT: 59 %
PLATELET COUNT: 203 10*3/uL (ref 150–450)
RED BLOOD CELL COUNT: 3.59 x10E6/uL — ABNORMAL LOW (ref 3.77–5.28)
RED CELL DISTRIBUTION WIDTH: 12.9 % (ref 11.7–15.4)
WHITE BLOOD CELL COUNT: 3.5 10*3/uL (ref 3.4–10.8)

## 2021-02-12 LAB — BASIC METABOLIC PANEL
BLOOD UREA NITROGEN: 30 mg/dL — ABNORMAL HIGH (ref 6–20)
BUN / CREAT RATIO: 25 — ABNORMAL HIGH (ref 9–23)
CALCIUM: 10.1 mg/dL (ref 8.7–10.2)
CHLORIDE: 103 mmol/L (ref 96–106)
CO2: 22 mmol/L (ref 20–29)
CREATININE: 1.2 mg/dL — ABNORMAL HIGH (ref 0.57–1.00)
EGFR: 62 mL/min/{1.73_m2}
GLUCOSE: 86 mg/dL (ref 70–99)
POTASSIUM: 4.6 mmol/L (ref 3.5–5.2)
SODIUM: 137 mmol/L (ref 134–144)

## 2021-02-12 LAB — MAGNESIUM: MAGNESIUM: 1.8 mg/dL (ref 1.6–2.3)

## 2021-02-12 LAB — PHOSPHORUS: PHOSPHORUS, SERUM: 3.4 mg/dL (ref 3.0–4.3)

## 2021-02-14 LAB — TACROLIMUS LEVEL: TACROLIMUS BLOOD: 11.5 ng/mL (ref 2.0–20.0)

## 2021-02-17 DIAGNOSIS — Z940 Kidney transplant status: Principal | ICD-10-CM

## 2021-02-17 DIAGNOSIS — Z79899 Other long term (current) drug therapy: Principal | ICD-10-CM

## 2021-02-17 MED ORDER — TACROLIMUS XR 1 MG TABLET,EXTENDED RELEASE 24 HR
ORAL_TABLET | Freq: Every day | ORAL | 11 refills | 30 days | Status: CP
Start: 2021-02-17 — End: ?
  Filled 2021-02-19: qty 90, 30d supply, fill #0

## 2021-02-17 NOTE — Unmapped (Signed)
[facility] Refill Coordination Note    Specialty Medication(s) to be Shipped:   Transplant: Envarsus 1mg     Other medication(s) to be shipped: No additional medications requested for fill at this time     Carolyn Kelley, DOB: 10/26/1990  Phone: [phone] (home)       All above HIPAA information was verified with patient.     Was a Nurse, learning disability used for this call? No    Completed refill call assessment today to schedule patient's medication shipment from the [facility] [phone]).  All relevant notes have been reviewed.     Specialty medication(s) and dose(s) confirmed: Regimen is correct and unchanged.   Changes to medications: Teton reports no changes at this time.  Changes to insurance: No  New side effects reported not previously addressed with a pharmacist or physician: None reported  Questions for the pharmacist: No    Confirmed patient received a Conservation officer, historic buildings and a Surveyor, mining with first shipment. The patient will receive a drug information handout for each medication shipped and additional FDA Medication Guides as required.       DISEASE/MEDICATION-SPECIFIC INFORMATION        N/A    SPECIALTY MEDICATION ADHERENCE     Medication Adherence    Patient reported X missed doses in the last month: 0  Specialty Medication: ENVARSUS XR 1  Patient is on additional specialty medications: No  Patient is on more than two specialty medications: No  Any gaps in refill history greater than 2 weeks in the last 3 months: no  Demonstrates understanding of importance of adherence: yes  Informant: patient  Reliability of informant: reliable  Confirmed plan for next specialty medication refill: delivery by pharmacy  Refills needed for supportive medications: not needed              Were doses missed due to medication being on hold? No    ENVARSUS XR 1 mg : 6 days of medicine on hand       REFERRAL TO PHARMACIST     Referral to the pharmacist: Not needed      [facility] Shipping address confirmed in Epic.     Delivery Scheduled: Yes, Expected medication delivery date: 02/20/21.  However, Rx request for refills was sent to the provider as there are none remaining.     Medication will be delivered via UPS to the prescription address in Epic WAM.    Yolonda Kida   [facility] Specialty Technician

## 2021-02-24 DIAGNOSIS — Z940 Kidney transplant status: Principal | ICD-10-CM

## 2021-02-26 LAB — CBC W/ DIFFERENTIAL
BANDED NEUTROPHILS ABSOLUTE COUNT: 0.2 10*3/uL — ABNORMAL HIGH (ref 0.0–0.1)
BASOPHILS ABSOLUTE COUNT: 0 10*3/uL (ref 0.0–0.2)
BASOPHILS RELATIVE PERCENT: 1 %
EOSINOPHILS ABSOLUTE COUNT: 0.1 10*3/uL (ref 0.0–0.4)
EOSINOPHILS RELATIVE PERCENT: 2 %
HEMATOCRIT: 33.6 % — ABNORMAL LOW (ref 34.0–46.6)
HEMOGLOBIN: 11.2 g/dL (ref 11.1–15.9)
IMMATURE GRANULOCYTES: 5 %
LYMPHOCYTES ABSOLUTE COUNT: 1.2 10*3/uL (ref 0.7–3.1)
LYMPHOCYTES RELATIVE PERCENT: 26 %
MEAN CORPUSCULAR HEMOGLOBIN CONC: 33.3 g/dL (ref 31.5–35.7)
MEAN CORPUSCULAR HEMOGLOBIN: 30.5 pg (ref 26.6–33.0)
MEAN CORPUSCULAR VOLUME: 92 fL (ref 79–97)
MONOCYTES ABSOLUTE COUNT: 0.7 10*3/uL (ref 0.1–0.9)
MONOCYTES RELATIVE PERCENT: 15 %
NEUTROPHILS ABSOLUTE COUNT: 2.3 10*3/uL (ref 1.4–7.0)
NEUTROPHILS RELATIVE PERCENT: 51 %
PLATELET COUNT: 233 10*3/uL (ref 150–450)
RED BLOOD CELL COUNT: 3.67 x10E6/uL — ABNORMAL LOW (ref 3.77–5.28)
RED CELL DISTRIBUTION WIDTH: 12.6 % (ref 11.7–15.4)
WHITE BLOOD CELL COUNT: 4.5 10*3/uL (ref 3.4–10.8)

## 2021-02-26 LAB — BASIC METABOLIC PANEL
BLOOD UREA NITROGEN: 35 mg/dL — ABNORMAL HIGH (ref 6–20)
BUN / CREAT RATIO: 29 — ABNORMAL HIGH (ref 9–23)
CALCIUM: 10.1 mg/dL (ref 8.7–10.2)
CHLORIDE: 105 mmol/L (ref 96–106)
CO2: 19 mmol/L — ABNORMAL LOW (ref 20–29)
CREATININE: 1.21 mg/dL — ABNORMAL HIGH (ref 0.57–1.00)
EGFR: 61 mL/min/{1.73_m2}
GLUCOSE: 89 mg/dL (ref 70–99)
POTASSIUM: 4.8 mmol/L (ref 3.5–5.2)
SODIUM: 138 mmol/L (ref 134–144)

## 2021-02-26 LAB — PHOSPHORUS: PHOSPHORUS, SERUM: 3.6 mg/dL (ref 3.0–4.3)

## 2021-02-26 LAB — MAGNESIUM: MAGNESIUM: 1.8 mg/dL (ref 1.6–2.3)

## 2021-02-27 ENCOUNTER — Other Ambulatory Visit: Payer: Self-pay | Admitting: Physical Medicine & Rehabilitation

## 2021-02-27 ENCOUNTER — Other Ambulatory Visit: Payer: Self-pay | Admitting: Neurology

## 2021-02-27 DIAGNOSIS — G253 Myoclonus: Secondary | ICD-10-CM

## 2021-02-27 LAB — TACROLIMUS LEVEL: TACROLIMUS BLOOD: 7.5 ng/mL (ref 2.0–20.0)

## 2021-02-27 MED ORDER — CLONAZEPAM 0.5 MG PO TABS: 0.5000 mg | ORAL_TABLET | Freq: Two times a day (BID) | ORAL | 3 refills | Status: DC

## 2021-02-27 NOTE — Addendum Note (Signed)
Addended by: Noberto Retort C on: 02/27/2021 03:15 PM   Modules accepted: Orders

## 2021-02-27 NOTE — Telephone Encounter (Signed)
The patient was last seen 05/23/20 with the following note:  She has a follow-up appointment with Dr. Tessa Lerner in August, he prescribes Klonopin for myoclonus in the legs, I am willing to assume the refill after her appointment with him in August, if he feels needed. ____________________________________  [place] narcotic registry checked. She tends to get #60 each month. Last filled for #60 on 01/27/21. She has pending appt here on 05/29/2021.

## 2021-02-27 NOTE — Telephone Encounter (Signed)
Pt states when she last saw Sarah, NP she informed pt that she would manage her clonazePAM (KLONOPIN) 0.5 MG tablet  but that she would want pt to see Meredith Staggers, MD one more time.  Pt states that she has seen someone at his practice and was told it would be  fine to have the medication filled by another office.  Please call pt to confirm if she is able to have her  clonazePAM (KLONOPIN) 0.5 MG tablet sent  to [place] by Judson Roch, NP

## 2021-03-04 DIAGNOSIS — Z940 Kidney transplant status: Principal | ICD-10-CM

## 2021-03-04 MED ORDER — TACROLIMUS XR 4 MG TABLET,EXTENDED RELEASE 24 HR
ORAL_TABLET | Freq: Every day | ORAL | 11 refills | 30 days | Status: CP
Start: 2021-03-04 — End: ?
  Filled 2021-03-12: qty 30, 30d supply, fill #0

## 2021-03-04 MED ORDER — MG-PLUS-PROTEIN 133 MG TABLET
ORAL_TABLET | Freq: Two times a day (BID) | ORAL | 11 refills | 30.00000 days | Status: CP
Start: 2021-03-04 — End: ?
  Filled 2021-03-12: qty 120, 30d supply, fill #0

## 2021-03-04 NOTE — Unmapped (Signed)
[facility] Refill Coordination Note    Specialty Medication(s) to be Shipped:   Transplant: Envarsus 1mg , Envarsus 4mg , mycophenolate mofetil 180mg  and Prednisone 5mg     Other medication(s) to be shipped: levetiracetam and magnesium     Carolyn Kelley, DOB: 29-Apr-1990  Phone: [phone] (home)       All above HIPAA information was verified with patient.     Was a Nurse, learning disability used for this call? No    Completed refill call assessment today to schedule patient's medication shipment from the [facility] [phone]).  All relevant notes have been reviewed.     Specialty medication(s) and dose(s) confirmed: Regimen is correct and unchanged.   Changes to medications: Carolyn Kelley reports no changes at this time.  Changes to insurance: No  New side effects reported not previously addressed with a pharmacist or physician: None reported  Questions for the pharmacist: No    Confirmed patient received a Conservation officer, historic buildings and a Surveyor, mining with first shipment. The patient will receive a drug information handout for each medication shipped and additional FDA Medication Guides as required.       DISEASE/MEDICATION-SPECIFIC INFORMATION        N/A    SPECIALTY MEDICATION ADHERENCE     Medication Adherence    Patient reported X missed doses in the last month: 0  Specialty Medication: Envarsus 1mg   Patient is on additional specialty medications: Yes  Additional Specialty Medications: Envarsus 4mg   Patient Reported Additional Medication X Missed Doses in the Last Month: 0  Patient is on more than two specialty medications: Yes  Specialty Medication: Mycophenolate 180mg   Patient Reported Additional Medication X Missed Doses in the Last Month: 0  Specialty Medication: Prednisone 5mg   Patient Reported Additional Medication X Missed Doses in the Last Month: 0        Were doses missed due to medication being on hold? No    Envarsus 1 mg: 14 days of medicine on hand   Envarsus 4 mg: 10 days of medicine on hand   Mycophenolate 180 mg: 10 days of medicine on hand   Prednisone 5 mg: 10 days of medicine on hand     REFERRAL TO PHARMACIST     Referral to the pharmacist: Not needed      SHIPPING     Shipping address confirmed in Epic.     Delivery Scheduled: Yes, Expected medication delivery date: 03/13/2021.     Medication will be delivered via UPS to the prescription address in Epic WAM.    [name] [facility] Specialty Technician

## 2021-03-04 NOTE — Unmapped (Signed)
Pt request for RX Refill magnesium oxide-Mg AA chelate (MAGNESIUM, AMINO ACID CHELATE,) 133 mg

## 2021-03-11 DIAGNOSIS — Z1159 Encounter for screening for other viral diseases: Principal | ICD-10-CM

## 2021-03-11 DIAGNOSIS — Z940 Kidney transplant status: Principal | ICD-10-CM

## 2021-03-11 DIAGNOSIS — Z79899 Other long term (current) drug therapy: Principal | ICD-10-CM

## 2021-03-11 NOTE — Unmapped (Signed)
Updated standing orders for [facility] per patient request.

## 2021-03-12 LAB — PHOSPHORUS: PHOSPHORUS, SERUM: 3.9 mg/dL (ref 3.0–4.3)

## 2021-03-12 LAB — CBC W/ DIFFERENTIAL
BASOPHILS ABSOLUTE COUNT: 0.2 10*3/uL (ref 0.0–0.2)
BASOPHILS RELATIVE PERCENT: 4 %
EOSINOPHILS ABSOLUTE COUNT: 0.2 10*3/uL (ref 0.0–0.4)
EOSINOPHILS RELATIVE PERCENT: 4 %
HEMATOCRIT: 33 % — ABNORMAL LOW (ref 34.0–46.6)
HEMOGLOBIN: 10.6 g/dL — ABNORMAL LOW (ref 11.1–15.9)
LYMPHOCYTES ABSOLUTE COUNT: 1.2 10*3/uL (ref 0.7–3.1)
LYMPHOCYTES RELATIVE PERCENT: 30 %
MEAN CORPUSCULAR HEMOGLOBIN CONC: 32.1 g/dL (ref 31.5–35.7)
MEAN CORPUSCULAR HEMOGLOBIN: 29.8 pg (ref 26.6–33.0)
MEAN CORPUSCULAR VOLUME: 93 fL (ref 79–97)
MONOCYTES ABSOLUTE COUNT: 0.4 10*3/uL (ref 0.1–0.9)
MONOCYTES RELATIVE PERCENT: 10 %
NEUTROPHILS ABSOLUTE COUNT: 2.1 10*3/uL (ref 1.4–7.0)
NEUTROPHILS RELATIVE PERCENT: 52 %
PLATELET COUNT: 216 10*3/uL (ref 150–450)
RED BLOOD CELL COUNT: 3.56 x10E6/uL — ABNORMAL LOW (ref 3.77–5.28)
RED CELL DISTRIBUTION WIDTH: 13 % (ref 11.7–15.4)
WHITE BLOOD CELL COUNT: 4.1 10*3/uL (ref 3.4–10.8)

## 2021-03-12 LAB — BASIC METABOLIC PANEL
BLOOD UREA NITROGEN: 30 mg/dL — ABNORMAL HIGH (ref 6–20)
BUN / CREAT RATIO: 29 — ABNORMAL HIGH (ref 9–23)
CALCIUM: 10.1 mg/dL (ref 8.7–10.2)
CHLORIDE: 105 mmol/L (ref 96–106)
CO2: 21 mmol/L (ref 20–29)
CREATININE: 1.04 mg/dL — ABNORMAL HIGH (ref 0.57–1.00)
EGFR: 74 mL/min/{1.73_m2}
GLUCOSE: 84 mg/dL (ref 70–99)
POTASSIUM: 4.6 mmol/L (ref 3.5–5.2)
SODIUM: 139 mmol/L (ref 134–144)

## 2021-03-12 LAB — MAGNESIUM: MAGNESIUM: 1.6 mg/dL (ref 1.6–2.3)

## 2021-03-12 MED FILL — ENVARSUS XR 1 MG TABLET,EXTENDED RELEASE: ORAL | 30 days supply | Qty: 90 | Fill #1

## 2021-03-12 MED FILL — PREDNISONE 5 MG TABLET: ORAL | 30 days supply | Qty: 30 | Fill #2

## 2021-03-12 MED FILL — LEVETIRACETAM 250 MG TABLET: ORAL | 90 days supply | Qty: 180 | Fill #3

## 2021-03-12 MED FILL — MYCOPHENOLATE SODIUM 180 MG TABLET,DELAYED RELEASE: ORAL | 30 days supply | Qty: 240 | Fill #3

## 2021-03-14 LAB — TACROLIMUS LEVEL: TACROLIMUS BLOOD: 6.6 ng/mL (ref 2.0–20.0)

## 2021-03-25 DIAGNOSIS — Z940 Kidney transplant status: Principal | ICD-10-CM

## 2021-03-25 NOTE — Unmapped (Signed)
Update unos form

## 2021-03-27 ENCOUNTER — Ambulatory Visit: Admit: 2021-03-27 | Discharge: 2021-03-28 | Payer: PRIVATE HEALTH INSURANCE

## 2021-03-27 ENCOUNTER — Ambulatory Visit
Admit: 2021-03-27 | Discharge: 2021-03-28 | Payer: PRIVATE HEALTH INSURANCE | Attending: Nephrology | Primary: Nephrology

## 2021-03-27 DIAGNOSIS — Z940 Kidney transplant status: Principal | ICD-10-CM

## 2021-03-27 DIAGNOSIS — N05A C3 glomerulonephritis of transplanted kidney: Principal | ICD-10-CM

## 2021-03-27 DIAGNOSIS — T8619 Other complication of kidney transplant: Principal | ICD-10-CM

## 2021-03-27 LAB — LIPID PANEL
CHOLESTEROL/HDL RATIO SCREEN: 3.3 (ref 1.0–4.5)
CHOLESTEROL: 174 mg/dL (ref <=200)
HDL CHOLESTEROL: 52 mg/dL (ref 40–60)
LDL CHOLESTEROL CALCULATED: 107 mg/dL — ABNORMAL HIGH (ref 40–99)
NON-HDL CHOLESTEROL: 122 mg/dL (ref 70–130)
TRIGLYCERIDES: 77 mg/dL (ref 0–150)
VLDL CHOLESTEROL CAL: 15.4 mg/dL (ref 8–32)

## 2021-03-27 LAB — URINALYSIS
BILIRUBIN UA: NEGATIVE
GLUCOSE UA: NEGATIVE
KETONES UA: NEGATIVE
LEUKOCYTE ESTERASE UA: NEGATIVE
NITRITE UA: NEGATIVE
PH UA: 6.5 (ref 5.0–9.0)
RBC UA: 3 /HPF (ref <4)
SPECIFIC GRAVITY UA: 1.025 (ref 1.005–1.030)
SQUAMOUS EPITHELIAL: 23 /HPF — ABNORMAL HIGH (ref 0–5)
UROBILINOGEN UA: 0.2
WBC UA: 1 /HPF (ref 0–5)

## 2021-03-27 LAB — CBC W/ AUTO DIFF
BASOPHILS ABSOLUTE COUNT: 0.1 10*9/L (ref 0.0–0.1)
BASOPHILS RELATIVE PERCENT: 1.6 %
EOSINOPHILS ABSOLUTE COUNT: 0.1 10*9/L (ref 0.0–0.5)
EOSINOPHILS RELATIVE PERCENT: 2 %
HEMATOCRIT: 34.4 % (ref 34.0–44.0)
HEMOGLOBIN: 11.3 g/dL (ref 11.3–14.9)
LYMPHOCYTES ABSOLUTE COUNT: 1.1 10*9/L (ref 1.1–3.6)
LYMPHOCYTES RELATIVE PERCENT: 25.4 %
MEAN CORPUSCULAR HEMOGLOBIN CONC: 32.7 g/dL (ref 32.0–36.0)
MEAN CORPUSCULAR HEMOGLOBIN: 30.1 pg (ref 25.9–32.4)
MEAN CORPUSCULAR VOLUME: 92 fL (ref 77.6–95.7)
MEAN PLATELET VOLUME: 8 fL (ref 6.8–10.7)
MONOCYTES ABSOLUTE COUNT: 0.7 10*9/L (ref 0.3–0.8)
MONOCYTES RELATIVE PERCENT: 16 %
NEUTROPHILS ABSOLUTE COUNT: 2.3 10*9/L (ref 1.8–7.8)
NEUTROPHILS RELATIVE PERCENT: 55 %
PLATELET COUNT: 214 10*9/L (ref 150–450)
RED BLOOD CELL COUNT: 3.74 10*12/L — ABNORMAL LOW (ref 3.95–5.13)
RED CELL DISTRIBUTION WIDTH: 14.2 % (ref 12.2–15.2)
WBC ADJUSTED: 4.1 10*9/L (ref 3.6–11.2)

## 2021-03-27 LAB — BASIC METABOLIC PANEL
ANION GAP: 9 mmol/L (ref 5–14)
BLOOD UREA NITROGEN: 33 mg/dL — ABNORMAL HIGH (ref 9–23)
BUN / CREAT RATIO: 32
CALCIUM: 10.7 mg/dL — ABNORMAL HIGH (ref 8.7–10.4)
CHLORIDE: 104 mmol/L (ref 98–107)
CO2: 25.2 mmol/L (ref 20.0–31.0)
CREATININE: 1.02 mg/dL — ABNORMAL HIGH
EGFR CKD-EPI (2021) FEMALE: 76 mL/min/{1.73_m2} (ref >=60)
GLUCOSE RANDOM: 93 mg/dL (ref 70–99)
POTASSIUM: 4.7 mmol/L (ref 3.4–4.8)
SODIUM: 138 mmol/L (ref 135–145)

## 2021-03-27 LAB — SLIDE REVIEW

## 2021-03-27 LAB — PHOSPHORUS: PHOSPHORUS: 3.2 mg/dL (ref 2.4–5.1)

## 2021-03-27 LAB — PROTEIN / CREATININE RATIO, URINE
CREATININE, URINE: 114.3 mg/dL
PROTEIN URINE: 27.4 mg/dL
PROTEIN/CREAT RATIO, URINE: 0.24

## 2021-03-27 LAB — HEPATIC FUNCTION PANEL
ALBUMIN: 4 g/dL (ref 3.4–5.0)
ALKALINE PHOSPHATASE: 59 U/L (ref 46–116)
ALT (SGPT): 14 U/L (ref 10–49)
AST (SGOT): 26 U/L (ref <=34)
BILIRUBIN DIRECT: 0.1 mg/dL (ref 0.00–0.30)
BILIRUBIN TOTAL: 0.3 mg/dL (ref 0.3–1.2)
PROTEIN TOTAL: 7.6 g/dL (ref 5.7–8.2)

## 2021-03-27 LAB — TACROLIMUS LEVEL, TROUGH: TACROLIMUS, TROUGH: 7.9 ng/mL (ref 5.0–15.0)

## 2021-03-27 LAB — MAGNESIUM: MAGNESIUM: 1.8 mg/dL (ref 1.6–2.6)

## 2021-03-27 NOTE — Unmapped (Signed)
AOBP:  Right arm Medium  cuff     1st reading:  125/74  91  2nd reading: 114/72  89  3rd reading:  114/71  88    Average:118/72  89        97.5

## 2021-03-27 NOTE — Unmapped (Signed)
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 She is seen in follow up of her transplant and to address other medical problems. 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   - Genetics panel was negative for causative variants 08/30/20.     - Treatment included an increase in the dose of Myfortic to 720 mg bid and initiation of prednisone 5 mg daily  - Serum creatinine has remained stable at <1.3 mg/dl (8.29 mg/dL today)  - UPC has normalized (0.240g/g today) with peak value of 0.966 on 03/29/20. UA with trace blood today, stable.  - Rituximab was deferred at the time of diagnosis due to an active COVID-19 community spike and concern about infection risk. With improvement on current immunosuppression we have not recently considered rituximab.  - Will prepare for use of Eculizumab in case current therapy or rituximab proves ineffective in the future. This will include completion of pre-eculizumab vaccination     - Repeat C3, C4, C3 glomerulopathy functional panel periodically  - Continue current immunosuppression for now. No plans for rituximab or eculizumab.    Immunosuppression Management [High Risk Medical Decision Making For Drug Therapy Requiring Intensive Monitoring For Toxicity]:   - Tacrolimus level pending (though won't be a true trough level) (target trough is 6-10 ng/mL).   - Envarsus 7 mg daily dose to be continued pending next true trough level  - Continue Myfortic 720 mg bid due to C3 glomerulopathy recurrence  - Prednisone 5 mg daily due to C3 glomerulopathy recurrence, steroid free prior to C3G diagnosis    Infectious Prophylaxis and Monitoring: CMV & EBV: D+/R+  - PJP prophylaxis: completed (s/p Bactrim x 6 months)  - CMV prophylaxis: completed (s/p Valcyte x3 months).   - COVID-19 vaccination: completed 4 doses (dose #4 05/17/20). She is aware of the limited efficacy of this vaccine in transplant recipients. Will plan a bivalent booster to be done locally    - Received Evusheld 08/30/20  - Received Men B on 01/26/20,03/01/20, 03/21/20  - Received MenACWY 01/26/20, needs additional dose - pt will get locally, discussed with pt.  - Booster doses of meningococcal vaccine every 1-5 years per CDC recommendations if chronic eculizumab therapy if is initiated.    Seizures Disorder, no seizures since 2014:    - EEG with no evidence of seizures on 06/25/20 .   - Keppra discontinuation being considered by Neurology team.  - She will follow up with [name], NP at [facility].       Myoclonus:   Will continue Klonopin, prescribed by PCP.  Continue [facility] physical therapy.      Metabolic Acidosis and Hyperkalemia, resolved with therapy  CO2 is now normal (25) and K is 4.7  Sodium bicarbonate 650 mg bid to be continued.    Pseudoaneurysm of AVF  She saw Dr. Norma Fredrickson 09/21/19 with plan to defer any intervention until 1 year has passed from transplant. She has no high output heart failure by echo or symptoms of CHF. She will be referred back to Dr. Norma Fredrickson. We would favor a nonurgent revision to minimize any risk  of rupture, but spare the AVF for future use if possible to do so safely. Patient has been advised to contact us with any scabbing or erosion of the skin over the pseudoaneurysm.    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    Pap Smear: 11/11/20 was negative including negative HPV  She will follow up with GYN and PCP     Follow up  3 months    History of Present Illness  Carolyn Kelley is a 31 y.o. 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 Her post-operative course was complicated by hypotension and hypoxia thought to be caused by an anaphylactic reaction to Campath. She has experienced no episodes of rejection or infectious complications. Due to presence of proteinuria she underwent kidney biopsy on 12/29/19. The biopsy showed C3 glomerulopathy recurrence. In response to this diagnosis Myfortic was increased from 540 mg bid to 720 mg bid and she was started on prednisone 5 mg daily while tacrolimus was continued.    She presents today feeling well. Currently in college - on break right now. Tremors largely stable from prior. Poor sleep last night - mild HA today. Family with COVID in the fall- pt tested, negative. She denies chest pain, shortness of breath, edema, fever, chills, cough, or allograft pain/tenderness. BP has been <130/80. Muscle spasms are controlled with Klonopin.     Last dose of Envarsus: 11AM yest    Transplant History:  Date of Transplant: 03/10/19  Organ Received: Left DDKT, DBD, SCD, KDPI 2%; cold ischemia 18 hrs.  Native Kidney Disease: C3 glomerulonephropathy  Pre-transplant CPRA:  0%  Post-Transplant Course: Complicated by anaphylactic reaction to Campath in OR  Induction: Campath  Date of Ureteral Stent Removal: 04/17/19  CMV/EBV Status: CMV D+/R+, EBV D+/R+  Rejection Episodes: None  Donor Specific Antibodies: Negative (most recent 01/26/20)   Biopsy 12/29/19: C3 glomerulopathy recurrence, no rejection    Other Past Medical History  1. C3 Glomerulopathy  2. Seizure Disorder  3. 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 HTN  5. 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 ADHD    Review of Systems  Otherwise as per HPI, all other systems reviewed and are negative.    Medications  Current Outpatient Medications   Medication Sig Dispense Refill   ??? acetaminophen (TYLENOL) 500 MG tablet Take 1-2 tablets (500-1,000 mg total) by mouth every six (6) hours as needed for pain or fever (> 38C). 100 tablet 0   ??? cholecalciferol, vitamin D3-125 mcg, 5,000 unit,, 125 mcg (5,000 unit) capsule Take 1 capsule (125 mcg total) by mouth daily. 100 capsule 11   ??? clonazePAM (KLONOPIN) 0.5 MG tablet Take 1 tablet (0.5 mg total) by mouth two (2) times a day. 60 tablet 3   ??? levETIRAcetam (KEPPRA) 250 MG tablet TAKE 1 TABLET BY MOUTH TWICE DAILY. 180 tablet 3   ??? magnesium oxide-Mg AA chelate (MAGNESIUM, AMINO ACID CHELATE,) 133 mg Take 2 tablets by mouth Two (2) times a day. 120 tablet 11   ??? mycophenolate (MYFORTIC) 180 MG EC tablet Take 4 tablets (720 mg total) by mouth Two (2) times a day. Adjust dose per medication card. 240 tablet 11   ??? predniSONE (DELTASONE) 5 MG tablet Take 1 tablet (5 mg total) by mouth daily.  30 tablet 11   ??? sodium bicarbonate 650 mg tablet Take 1 tablet (650 mg total) by mouth Two (2) times a day. 120 tablet 11   ??? tacrolimus (ENVARSUS XR) 1 mg Tb24 extended release tablet Take three (1mg ) tablets with one (4mg ) tablet by mouth daily for a total daily dose of 7 mg. 90 tablet 11   ??? tacrolimus (ENVARSUS XR) 4 mg Tb24 extended release tablet Take one (4mg ) tablet with three (1mg ) tablets by mouth daily for a total daily dose of 7 mg. 30 tablet 11   ??? tretinoin (RETIN-A) 0.025 % cream Start every other night and increase to nightly as tolerated 45 g 3     No current facility-administered medications for this visit.       Physical Exam   BP 118/72 (BP Site: R Arm, BP Position: Sitting, BP Cuff Size: Medium)  - Pulse 89  - Temp 36.4 ??C (97.5 ??F) (Temporal)  - Ht 157.5 cm (5' 2.01)  - Wt 68.8 kg (151 lb 9.6 oz)  - BMI 27.72 kg/m??    General: Patient is a pleasant female in no apparent distress.  Eyes: Sclera anicteric.  Neck: trachea midline.  Lungs: Clear to auscultation bilaterally, no wheezes/rales/rhonchi.  Cardiovascular: Regular rate and rhythm without murmurs, rubs or gallops.  Abdomen: Soft nontender/nondistended.  Extremities: Without edema, joints without evidence of synovitis; pseudoaneurysm of AVF LUE  Skin: Without rash  Neurological: Grossly nonfocal; uses a walker for ambulation due to myoclonus  Psychiatric: Mood and affect appropriate.      Laboratory Results and imaging Reviewed in EMR

## 2021-03-27 NOTE — Unmapped (Signed)
Saw patient in clinic. Reports she is doing well, will start school Jan 9th.    HA today since she didn't sleep well    +tremors are the same    Drinking 10 cups of fluids a day    Good appetite    Not checking BP at home    Labs today, took envarsus at 11am yesterday    Denies dizziness,CP, heart arrhythmia, SOB, abdominal pain, n/v/d, urinary problems, numbness/tingling, or fevers

## 2021-03-28 LAB — CMV DNA, QUANTITATIVE, PCR: CMV VIRAL LD: NOT DETECTED

## 2021-03-28 LAB — BK VIRUS QUANTITATIVE PCR, BLOOD: BK BLOOD RESULT: NOT DETECTED

## 2021-04-02 ENCOUNTER — Encounter: Payer: Medicaid Other | Attending: Physical Medicine & Rehabilitation | Admitting: Physical Medicine & Rehabilitation

## 2021-04-02 DIAGNOSIS — F900 Attention-deficit hyperactivity disorder, predominantly inattentive type: Secondary | ICD-10-CM | POA: Insufficient documentation

## 2021-04-02 DIAGNOSIS — G931 Anoxic brain damage, not elsewhere classified: Secondary | ICD-10-CM | POA: Insufficient documentation

## 2021-04-02 LAB — HLA DS POST TRANSPLANT
ANTI-DONOR DRW #1 MFI: 67 MFI
ANTI-DONOR DRW #2 MFI: 67 MFI
ANTI-DONOR HLA-A #1 MFI: 29 MFI
ANTI-DONOR HLA-A #2 MFI: 43 MFI
ANTI-DONOR HLA-C #1 MFI: 20 MFI
ANTI-DONOR HLA-C #2 MFI: 66 MFI
ANTI-DONOR HLA-DP #2 MFI: 123 MFI
ANTI-DONOR HLA-DQB #1 MFI: 136 MFI
ANTI-DONOR HLA-DQB #2 MFI: 132 MFI
ANTI-DONOR HLA-DR #1 MFI: 0 MFI

## 2021-04-02 LAB — FSAB CLASS 2 ANTIBODY SPECIFICITY: HLA CL2 AB RESULT: POSITIVE

## 2021-04-02 LAB — FSAB CLASS 1 ANTIBODY SPECIFICITY: HLA CLASS 1 ANTIBODY RESULT: NEGATIVE

## 2021-04-07 DIAGNOSIS — Z79899 Other long term (current) drug therapy: Principal | ICD-10-CM

## 2021-04-07 DIAGNOSIS — Z1159 Encounter for screening for other viral diseases: Principal | ICD-10-CM

## 2021-04-07 DIAGNOSIS — Z940 Kidney transplant status: Principal | ICD-10-CM

## 2021-04-10 NOTE — Unmapped (Signed)
The [facility] has made a second and final attempt to reach this patient to refill the following medication: envarsus 1mg  & 4mg , mycophenolate, prednisone and maintenance meds.      We have left voicemails on the following phone numbers: [phone] and have been unable to leave messages on the following phone numbers: [phone].    Dates contacted: 01/13 & 01/19  Last scheduled delivery: 03/13/2021    The patient may be at risk of non-compliance with this medication. The patient should call the [facility] at [phone]  Option 4, then Option 2 (all other specialty patients) to refill medication.    Oretha Milch   [facility] Specialty Technician

## 2021-04-14 NOTE — Unmapped (Signed)
[facility] Refill Coordination Note    Specialty Medication(s) to be Shipped:   Transplant: Envarsus 1mg , Envarsus 4mg , mycophenolate mofetil 180mg  and Prednisone 5mg     Other medication(s) to be shipped: sodium bicarb, magnesium and vitamin D     Carolyn Kelley, DOB: 02/13/1991  Phone: [phone] (home)       All above HIPAA information was verified with patient.     Was a Nurse, learning disability used for this call? No    Completed refill call assessment today to schedule patient's medication shipment from the [facility] [phone]).  All relevant notes have been reviewed.     Specialty medication(s) and dose(s) confirmed: Regimen is correct and unchanged.   Changes to medications: Carolyn Kelley reports no changes at this time.  Changes to insurance: No  New side effects reported not previously addressed with a pharmacist or physician: None reported  Questions for the pharmacist: No    Confirmed patient received a Conservation officer, historic buildings and a Surveyor, mining with first shipment. The patient will receive a drug information handout for each medication shipped and additional FDA Medication Guides as required.       DISEASE/MEDICATION-SPECIFIC INFORMATION        N/A    SPECIALTY MEDICATION ADHERENCE     Medication Adherence    Patient reported X missed doses in the last month: 0  Specialty Medication: ENVARSUS XR 4 mg Tb24 extended release tablet (tacrolimus)  Patient is on additional specialty medications: Yes  Additional Specialty Medications: ENVARSUS XR 1 mg Tb24 extended release tablet (tacrolimus)  Patient Reported Additional Medication X Missed Doses in the Last Month: 0  Patient is on more than two specialty medications: Yes  Specialty Medication: mycophenolate 180 MG EC tablet (MYFORTIC)  Patient Reported Additional Medication X Missed Doses in the Last Month: 0  Specialty Medication: Prednisone 5mg   Patient Reported Additional Medication X Missed Doses in the Last Month: 0 Were doses missed due to medication being on hold? No    Envarsus 1 mg: 6 days of medicine on hand   Envarsus 4 mg: 6 days of medicine on hand   Mycophenolate 180 mg: 6 days of medicine on hand   Prednisone 5 mg: 6 days of medicine on hand     REFERRAL TO PHARMACIST     Referral to the pharmacist: Not needed      SHIPPING     Shipping address confirmed in Epic.     Delivery Scheduled: Yes, Expected medication delivery date: 04/16/2021.     Medication will be delivered via UPS to the prescription address in Epic WAM.    Carolyn Kelley [facility] Specialty Technician

## 2021-04-15 MED FILL — SODIUM BICARBONATE 650 MG TABLET: ORAL | 60 days supply | Qty: 120 | Fill #1

## 2021-04-15 MED FILL — MYCOPHENOLATE SODIUM 180 MG TABLET,DELAYED RELEASE: ORAL | 30 days supply | Qty: 240 | Fill #4

## 2021-04-15 MED FILL — PREDNISONE 5 MG TABLET: ORAL | 30 days supply | Qty: 30 | Fill #3

## 2021-04-15 MED FILL — ENVARSUS XR 4 MG TABLET,EXTENDED RELEASE: ORAL | 30 days supply | Qty: 30 | Fill #1

## 2021-04-15 MED FILL — ENVARSUS XR 1 MG TABLET,EXTENDED RELEASE: ORAL | 30 days supply | Qty: 90 | Fill #2

## 2021-04-15 MED FILL — CHOLECALCIFEROL (VITAMIN D3) 125 MCG (5,000 UNIT) CAPSULE: ORAL | 100 days supply | Qty: 100 | Fill #3

## 2021-04-15 MED FILL — MG-PLUS-PROTEIN 133 MG TABLET: ORAL | 30 days supply | Qty: 120 | Fill #1

## 2021-04-22 LAB — CBC W/ DIFFERENTIAL
BANDED NEUTROPHILS ABSOLUTE COUNT: 0.2 10*3/uL — ABNORMAL HIGH (ref 0.0–0.1)
BASOPHILS ABSOLUTE COUNT: 0 10*3/uL (ref 0.0–0.2)
BASOPHILS RELATIVE PERCENT: 1 %
EOSINOPHILS ABSOLUTE COUNT: 0.1 10*3/uL (ref 0.0–0.4)
EOSINOPHILS RELATIVE PERCENT: 2 %
HEMATOCRIT: 35.1 % (ref 34.0–46.6)
HEMOGLOBIN: 11.4 g/dL (ref 11.1–15.9)
IMMATURE GRANULOCYTES: 4 %
LYMPHOCYTES ABSOLUTE COUNT: 1 10*3/uL (ref 0.7–3.1)
LYMPHOCYTES RELATIVE PERCENT: 24 %
MEAN CORPUSCULAR HEMOGLOBIN CONC: 32.5 g/dL (ref 31.5–35.7)
MEAN CORPUSCULAR HEMOGLOBIN: 30.4 pg (ref 26.6–33.0)
MEAN CORPUSCULAR VOLUME: 94 fL (ref 79–97)
MONOCYTES ABSOLUTE COUNT: 0.6 10*3/uL (ref 0.1–0.9)
MONOCYTES RELATIVE PERCENT: 13 %
NEUTROPHILS ABSOLUTE COUNT: 2.3 10*3/uL (ref 1.4–7.0)
NEUTROPHILS RELATIVE PERCENT: 56 %
PLATELET COUNT: 219 10*3/uL (ref 150–450)
RED BLOOD CELL COUNT: 3.75 x10E6/uL — ABNORMAL LOW (ref 3.77–5.28)
RED CELL DISTRIBUTION WIDTH: 13 % (ref 11.7–15.4)
WHITE BLOOD CELL COUNT: 4.1 10*3/uL (ref 3.4–10.8)

## 2021-04-22 NOTE — Unmapped (Signed)
Patient left voicemail requesting information on safety of a whey protein powder.   Returned patient's call. Patient had 1 whey protein powder and 2 pea protein powders she wanted to run by this writer to make sure they were safe for use after transplant. Review all 3 powders and all do not contain any ingredients that would be problematic for transplant patients.   Discussed that patient should stick to the 1 scoop serving size and that she does not need more than 1 scoop per day given the rest of her usual diet.     Lanelle Bal, RD, LDN, CCTD  Abdominal Transplant Dietitian   Pager: [phone]

## 2021-04-23 LAB — BASIC METABOLIC PANEL
BLOOD UREA NITROGEN: 36 mg/dL — ABNORMAL HIGH (ref 6–20)
BUN / CREAT RATIO: 34 — ABNORMAL HIGH (ref 9–23)
CALCIUM: 10.4 mg/dL — ABNORMAL HIGH (ref 8.7–10.2)
CHLORIDE: 102 mmol/L (ref 96–106)
CO2: 23 mmol/L (ref 20–29)
CREATININE: 1.06 mg/dL — ABNORMAL HIGH (ref 0.57–1.00)
EGFR: 72 mL/min/{1.73_m2}
GLUCOSE: 76 mg/dL (ref 70–99)
POTASSIUM: 5 mmol/L (ref 3.5–5.2)
SODIUM: 139 mmol/L (ref 134–144)

## 2021-04-23 LAB — MAGNESIUM: MAGNESIUM: 1.9 mg/dL (ref 1.6–2.3)

## 2021-04-23 LAB — PHOSPHORUS: PHOSPHORUS, SERUM: 3.2 mg/dL (ref 3.0–4.3)

## 2021-04-24 LAB — TACROLIMUS LEVEL: TACROLIMUS BLOOD: 9.2 ng/mL (ref 2.0–20.0)

## 2021-05-05 DIAGNOSIS — Z1159 Encounter for screening for other viral diseases: Principal | ICD-10-CM

## 2021-05-05 DIAGNOSIS — Z79899 Other long term (current) drug therapy: Principal | ICD-10-CM

## 2021-05-05 DIAGNOSIS — Z940 Kidney transplant status: Principal | ICD-10-CM

## 2021-05-10 NOTE — Unmapped (Signed)
Pt called due to missing her morning IS meds. She took her myfortic and envarsus at 6 pm (normally takes them both at 11 am and then an evening dose of myfortic). She called requesting how to take her meds. I advised resuming her routine myfortic dosing tomorrow morning. I advised she should take envarsus at 4 pm tomorrow, 2 pm the next day, and then back to 11 am the next day (to get back onto her routine schedule). She expressed understanding.    Florian Buff, MD  Fellow, PGY4   p [phone]  Division of Nephrology and Hypertension  [facility]

## 2021-05-10 NOTE — Unmapped (Signed)
Recent:   What is the date of your last related visit?  Jan 2023  Related acute medications Rx'd:  n/a  Home treatment tried:  n/a      Relevant:   Allergies: Bee pollen and Pollen extracts  Medications: Envarsus XR, mycophenolate  Health History: renal failure  Weight: n/a      [place] [facility] patients only:  What was the date of your last cancer treatment (mm/dd/yy)?: n/a  Was the treatment oral or infusion?: n/a  Are you currently on TVEC (yes/no)?: n/a    Reason for Disposition  ??? [1] Caller has URGENT medicine question about med that PCP or specialist prescribed AND [2] triager unable to answer question    Answer Assessment - Initial Assessment Questions  1. NAME of MEDICATION: What medicine are you calling about?      Envarsus XR, Mycophenalate  2. QUESTION: What is your question? (e.g., medication refill, side effect)      Took meds late.  Needs to know how to take next dose  3. PRESCRIBING HCP: Who prescribed it? Reason: if prescribed by specialist, call should be referred to that group.      [facility] (Nephrology)  4. SYMPTOMS: Do you have any symptoms?      Declines triage  5. SEVERITY: If symptoms are present, ask Are they mild, moderate or severe?      Declines triage  6. PREGNANCY:  Is there any chance that you are pregnant? When was your last menstrual period?      n/a    Protocols used: MEDICATION QUESTION CALL-A-AH

## 2021-05-12 NOTE — Unmapped (Signed)
[facility] Clinical Assessment & Refill Coordination Note    Carolyn Kelley, DOB: Jul 06, 1990  Phone: [phone] (home)     All above HIPAA information was verified with patient.     Was a Nurse, learning disability used for this call? No    Specialty Medication(s):   Transplant: Envarsus 1mg , Envarsus 4mg ,  mycophenolic acid 180mg  and Prednisone 5mg      Current Outpatient Medications   Medication Sig Dispense Refill   ??? acetaminophen (TYLENOL) 500 MG tablet Take 1-2 tablets (500-1,000 mg total) by mouth every six (6) hours as needed for pain or fever (> 38C). 100 tablet 0   ??? cholecalciferol, vitamin D3-125 mcg, 5,000 unit,, 125 mcg (5,000 unit) capsule Take 1 capsule (125 mcg total) by mouth daily. 100 capsule 11   ??? clonazePAM (KLONOPIN) 0.5 MG tablet Take 1 tablet (0.5 mg total) by mouth two (2) times a day. 60 tablet 3   ??? levETIRAcetam (KEPPRA) 250 MG tablet TAKE 1 TABLET BY MOUTH TWICE DAILY. 180 tablet 3   ??? magnesium oxide-Mg AA chelate (MAGNESIUM, AMINO ACID CHELATE,) 133 mg Take 2 tablets by mouth Two (2) times a day. 120 tablet 11   ??? mycophenolate (MYFORTIC) 180 MG EC tablet Take 4 tablets (720 mg total) by mouth Two (2) times a day. Adjust dose per medication card. 240 tablet 11   ??? predniSONE (DELTASONE) 5 MG tablet Take 1 tablet (5 mg total) by mouth daily. 30 tablet 11   ??? sodium bicarbonate 650 mg tablet Take 1 tablet (650 mg total) by mouth Two (2) times a day. 120 tablet 11   ??? tacrolimus (ENVARSUS XR) 1 mg Tb24 extended release tablet Take three (1mg ) tablets with one (4mg ) tablet by mouth daily for a total daily dose of 7 mg. 90 tablet 11   ??? tacrolimus (ENVARSUS XR) 4 mg Tb24 extended release tablet Take one (4mg ) tablet with three (1mg ) tablets by mouth daily for a total daily dose of 7 mg. 30 tablet 11   ??? tretinoin (RETIN-A) 0.025 % cream Start every other night and increase to nightly as tolerated 45 g 3     No current facility-administered medications for this visit. Changes to medications: Lillyanne reports no changes at this time.    Allergies   Allergen Reactions   ??? Bee Pollen Swelling     Sneezing, congestion  denies swelling.  Sneezing, congestion  denies swelling.     ??? Pollen Extracts Swelling       Changes to allergies: No    SPECIALTY MEDICATION ADHERENCE     envarsus 1mg   : 5 days of medicine on hand   envarsus 4mg   : 5 days of medicine on hand   Mycophenolate 180mg   : 5 days of medicine on hand   Prednisone 5mg   : 5 days of medicine on hand       Medication Adherence    Patient reported X missed doses in the last month: 0  Specialty Medication: envarsus 1mg   Patient is on additional specialty medications: Yes  Additional Specialty Medications: Envarsus 4mg   Patient Reported Additional Medication X Missed Doses in the Last Month: 0  Patient is on more than two specialty medications: Yes  Specialty Medication: mycophenolate 180mg   Patient Reported Additional Medication X Missed Doses in the Last Month: 0  Specialty Medication: prednisone 5mg   Patient Reported Additional Medication X Missed Doses in the Last Month: 0          Specialty  medication(s) dose(s) confirmed: Regimen is correct and unchanged.     Are there any concerns with adherence? No    Adherence counseling provided? Not needed    CLINICAL MANAGEMENT AND INTERVENTION      Clinical Benefit Assessment:    Do you feel the medicine is effective or helping your condition? Yes    Clinical Benefit counseling provided? Not needed    Adverse Effects Assessment:    Are you experiencing any side effects? No    Are you experiencing difficulty administering your medicine? No    Quality of Life Assessment:         How many days over the past month did your transplant  keep you from your normal activities? For example, brushing your teeth or getting up in the morning. 0    Have you discussed this with your provider? Not needed    Acute Infection Status:    Acute infections noted within Epic:  No active infections  Patient reported infection: None    Therapy Appropriateness:    Is therapy appropriate and patient progressing towards therapeutic goals? Yes, therapy is appropriate and should be continued    DISEASE/MEDICATION-SPECIFIC INFORMATION      N/A    PATIENT SPECIFIC NEEDS     - Does the patient have any physical, cognitive, or cultural barriers? No    - Is the patient high risk? Yes, patient is taking a REMS drug. Medication is dispensed in compliance with REMS program    - Does the patient require a Care Management Plan? No         SHIPPING     Specialty Medication(s) to be Shipped:   Transplant: Envarsus 1mg , Envarsus 4mg ,  mycophenolic acid 180mg  and Prednisone 5mg     Other medication(s) to be shipped: mgplus     Changes to insurance: No    Delivery Scheduled: Yes, Expected medication delivery date: 05/15/2021.     Medication will be delivered via UPS to the confirmed prescription address in [facility].    The patient will receive a drug information handout for each medication shipped and additional FDA Medication Guides as required.  Verified that patient has previously received a Conservation officer, historic buildings and a Surveyor, mining.    The patient or caregiver noted above participated in the development of this care plan and knows that they can request review of or adjustments to the care plan at any time.      All of the patient's questions and concerns have been addressed.    Thad Ranger   [facility] Specialty Pharmacist

## 2021-05-14 MED FILL — MG-PLUS-PROTEIN 133 MG TABLET: ORAL | 30 days supply | Qty: 120 | Fill #2

## 2021-05-14 MED FILL — ENVARSUS XR 1 MG TABLET,EXTENDED RELEASE: ORAL | 30 days supply | Qty: 90 | Fill #3

## 2021-05-14 MED FILL — MYCOPHENOLATE SODIUM 180 MG TABLET,DELAYED RELEASE: ORAL | 30 days supply | Qty: 240 | Fill #5

## 2021-05-14 MED FILL — ENVARSUS XR 4 MG TABLET,EXTENDED RELEASE: ORAL | 30 days supply | Qty: 30 | Fill #2

## 2021-05-14 MED FILL — PREDNISONE 5 MG TABLET: ORAL | 30 days supply | Qty: 30 | Fill #4

## 2021-05-21 LAB — CBC W/ DIFFERENTIAL
BANDED NEUTROPHILS ABSOLUTE COUNT: 0.1 10*3/uL (ref 0.0–0.1)
BASOPHILS ABSOLUTE COUNT: 0 10*3/uL (ref 0.0–0.2)
BASOPHILS RELATIVE PERCENT: 1 %
EOSINOPHILS ABSOLUTE COUNT: 0.1 10*3/uL (ref 0.0–0.4)
EOSINOPHILS RELATIVE PERCENT: 4 %
HEMATOCRIT: 37 % (ref 34.0–46.6)
HEMOGLOBIN: 11.8 g/dL (ref 11.1–15.9)
IMMATURE GRANULOCYTES: 3 %
LYMPHOCYTES ABSOLUTE COUNT: 0.8 10*3/uL (ref 0.7–3.1)
LYMPHOCYTES RELATIVE PERCENT: 25 %
MEAN CORPUSCULAR HEMOGLOBIN CONC: 31.9 g/dL (ref 31.5–35.7)
MEAN CORPUSCULAR HEMOGLOBIN: 29.5 pg (ref 26.6–33.0)
MEAN CORPUSCULAR VOLUME: 93 fL (ref 79–97)
MONOCYTES ABSOLUTE COUNT: 0.6 10*3/uL (ref 0.1–0.9)
MONOCYTES RELATIVE PERCENT: 17 %
NEUTROPHILS ABSOLUTE COUNT: 1.7 10*3/uL (ref 1.4–7.0)
NEUTROPHILS RELATIVE PERCENT: 50 %
PLATELET COUNT: 218 10*3/uL (ref 150–450)
RED BLOOD CELL COUNT: 4 x10E6/uL (ref 3.77–5.28)
RED CELL DISTRIBUTION WIDTH: 12.8 % (ref 11.7–15.4)
WHITE BLOOD CELL COUNT: 3.3 10*3/uL — ABNORMAL LOW (ref 3.4–10.8)

## 2021-05-21 LAB — BASIC METABOLIC PANEL
BLOOD UREA NITROGEN: 32 mg/dL — ABNORMAL HIGH (ref 6–20)
BUN / CREAT RATIO: 31 — ABNORMAL HIGH (ref 9–23)
CALCIUM: 10.2 mg/dL (ref 8.7–10.2)
CHLORIDE: 101 mmol/L (ref 96–106)
CO2: 24 mmol/L (ref 20–29)
CREATININE: 1.04 mg/dL — ABNORMAL HIGH (ref 0.57–1.00)
EGFR: 74 mL/min/{1.73_m2}
GLUCOSE: 86 mg/dL (ref 70–99)
POTASSIUM: 4.7 mmol/L (ref 3.5–5.2)
SODIUM: 137 mmol/L (ref 134–144)

## 2021-05-21 LAB — MAGNESIUM: MAGNESIUM: 1.9 mg/dL (ref 1.6–2.3)

## 2021-05-21 LAB — PHOSPHORUS: PHOSPHORUS, SERUM: 3.4 mg/dL (ref 3.0–4.3)

## 2021-05-23 LAB — TACROLIMUS LEVEL: TACROLIMUS BLOOD: 7 ng/mL (ref 2.0–20.0)

## 2021-05-28 NOTE — Progress Notes (Incomplete)
PATIENT: Heather Sims DOB: 07-22-1990  REASON FOR VISIT: follow up for anoxia  HISTORY FROM: patient  HISTORY OF PRESENT ILLNESS: Today 05/28/21  [name]   05/23/2020 SS: 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  Reportedly her seizures occurred during time of severe illness related to her ESRD, around 2014.  She received a kidney transplant in December 2020.  She is on Keppra 250 mg twice daily.  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 Requests we continue refilling this medication going forward.  She is using a walker, but learning to use a cane.  She wishes to try to come off the [place].  She is learning to drive.  She lives with her mom.  Here today for evaluation unaccompanied.  Update 05/24/2019 SS:[name] is a 31 year old female with history of prior cardiac arrest, and anoxia of the brain with subsequent issues with gait instability, and seizures.  She is taking Keppra 250 mg twice daily and is tolerating the medication well.  She has not had recurrent seizure.  She received a kidney transplant in December 2020.  She is back in school, finishing her degree in psychology.  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  Her rejection medications have possible side effect of tremor.  She has a walker for ambulation. She is learning to drive.  She wonders if she may be able to come off Keppra at some point she presents today for follow-up unaccompanied.  HISTORY 05/23/2018 SS: [name] is a 31 year old female who presents for yearly follow-up with history of seizures.  She is on hemodialysis with end-stage renal disease.  She is currently taking Keppra 250 mg twice daily and is tolerating medication well.  She reports her last seizure was about 5 years ago.  She  is currently on the kidney transplant list at [facility].  She does not drive a car but she reports she is learning.  She lives with her mom who brought her to this appointment today.  She denies any new problems or concerns.  She is using a walker today.  She reports she handles her own medications.  She presents today for follow-up.   REVIEW OF SYSTEMS: Out of a complete 14 system review of symptoms, the patient complains only of the following symptoms, and all other reviewed systems are negative.  Tremor, seizure  ALLERGIES: Allergies  Allergen Reactions   Pollen Extract     Sneezing, congestion denies swelling.    HOME MEDICATIONS: Outpatient Medications Prior to Visit  Medication Sig Dispense Refill   acetaminophen (TYLENOL) 325 MG tablet Take 2 tablets (650 mg total) by mouth every 4 (four) hours as needed (pain).     Cholecalciferol 125 MCG (5000 UT) capsule Take by mouth.     clonazePAM (KLONOPIN) 0.5 MG tablet Take 1 tablet (0.5 mg total) by mouth 2 (two) times daily. 60 tablet 3   ENVARSUS XR 4 MG TB24 Take 1 tablet by mouth daily.     levETIRAcetam (KEPPRA) 250 MG tablet TAKE 1 TABLET BY MOUTH TWICE DAILY. 180 tablet 3   methylphenidate (RITALIN) 5 MG tablet Take 0.5-1 tablets (2.5-5 mg total) by mouth 2 (two) times daily with breakfast and lunch. 60 tablet 0   mycophenolate (MYFORTIC) 180  MG EC tablet Take 180 mg by mouth 2 (two) times daily. Take 4 mg twice a day     predniSONE (DELTASONE) 5 MG tablet Take 5 mg by mouth daily.     Specialty Vitamins Products (MAGNESIUM, AMINO ACID CHELATE,) 133 MG tablet Take by mouth.     Tacrolimus ER (ENVARSUS XR) 1 MG TB24 Take 1 mg by mouth every morning. Take 3 tablets every morning     tretinoin (RETIN-A) 0.025 % cream Apply topically at bedtime.     No facility-administered medications prior to visit.    PAST MEDICAL HISTORY: Past Medical History:  Diagnosis Date   Anemia    Brain injury    Cardiac arrest ([place]) 03/26/2011    Cardiomyopathy    CHF (congestive heart failure) (H[facility])    Chronic kidney disease    [facility]- [place]   Depression    Family history of adverse reaction to anesthesia    Mother hard to wake up.   Gait disorder 01/11/2013   Heart failure    History of blood transfusion 11/2011   Hypertension    Ileitis    MPGN (membranoproliferative glomerulonephritis), type 2    Pneumonia    'walking pneumonia'   Renal disorder    Seizures ([place])    2013- last time    PAST SURGICAL HISTORY: Past Surgical History:  Procedure Laterality Date   AV FISTULA PLACEMENT  12/18/2011   Procedure: ARTERIOVENOUS (AV) FISTULA CREATION;  Surgeon: Angelia Mould, MD;  Location: [facility] OR;  Service: Vascular;  Laterality: Left;   INSERTION OF DIALYSIS CATHETER Right 08/06/2014   Procedure: INSERTION OF DIALYSIS CATHETER;  Surgeon: Elam Dutch, MD;  Location: [facility];  Service: Vascular;  Laterality: Right;   RENAL BIOPSY     REVISON OF ARTERIOVENOUS FISTULA Left 2/97/9892   Procedure: PLICATION OF ARTERIOVENOUS FISTULA;  Surgeon: Elam Dutch, MD;  Location: [facility] OR;  Service: Vascular;  Laterality: Left;   REVISON OF ARTERIOVENOUS FISTULA Left 03/12/2017   Procedure: REVISION PLICATION OF ARTERIOVENOUS FISTULA LEFT UPPER ARM;  Surgeon: Serafina Mitchell, MD;  Location: [facility] OR;  Service: Vascular;  Laterality: Left;    FAMILY HISTORY: Family History  Problem Relation Age of Onset   Hypertension Mother    Diabetes Father    Hyperlipidemia Father     SOCIAL HISTORY: Social History   Socioeconomic History   Marital status: Single    Spouse name: Not on file   Number of children: 0   Years of education: college jr   Highest education level: Not on file  Occupational History   Not on file  Tobacco Use   Smoking status: Never   Smokeless tobacco: Never  Vaping Use   Vaping Use: Never used  Substance and Sexual Activity   Alcohol use: No    Alcohol/week: 0.0 standard drinks   Drug use: No    Sexual activity: Never    Birth control/protection: Abstinence  Other Topics Concern   Not on file  Social History Narrative   Lives at home with her mother & brother   Right handed      ** Merged History Encounter **       Social Determinants of Health   Financial Resource Strain: Not on file  Food Insecurity: Not on file  Transportation Needs: Not on file  Physical Activity: Not on file  Stress: Not on file  Social Connections: Not on file  Intimate Partner Violence: Not on file  PHYSICAL EXAM  There were no vitals filed for this visit.  There is no height or weight on file to calculate BMI.  Generalized: Well developed, in no acute distress  Neurological examination  Mentation: Alert oriented to time, place, history taking. Follows all commands speech and language fluent Cranial nerve II-XII: Pupils were equal round reactive to light. Extraocular movements were full, visual field were full on confrontational test. Facial sensation and strength were normal. Head turning and shoulder shrug were normal and symmetric. Motor: Good strength of all extremities, mild increased spasticity in lower extremities. Sensory: Sensory testing is intact to soft touch on all 4 extremities. No evidence of extinction is noted.  Coordination: Cerebellar testing reveals good finger-nose-finger and heel-to-shin bilaterally. No tremor noted. Gait and station: Gait is wide-based, unsteady, with a walker, gait is steady. Reflexes: Deep tendon reflexes are symmetric but increased   DIAGNOSTIC DATA (LABS, IMAGING, TESTING) - I reviewed patient records, labs, notes, testing and imaging myself where available.  Lab Results  Component Value Date   WBC 6.6 12/18/2011   HGB 13.3 03/12/2017   HCT 39.0 03/12/2017   MCV 90.9 12/18/2011   PLT 244 12/18/2011      Component Value Date/Time   NA 137 03/12/2017 1024   K 4.8 03/12/2017 1024   CL 99 12/18/2011 0505   CO2 27 12/18/2011 0505   GLUCOSE 80  03/12/2017 1024   BUN 17 12/18/2011 0505   CREATININE 2.30 (H) 12/18/2011 0505   CALCIUM 9.4 12/18/2011 0505   CALCIUM 8.4 10/28/2011 1327   PROT 6.1 11/29/2011 0224   ALBUMIN 2.4 (L) 12/18/2011 0505   AST 56 (H) 11/29/2011 0224   ALT 6 12/01/2011 2330   ALKPHOS 62 11/29/2011 0224   BILITOT 0.4 11/29/2011 0224   GFRNONAA 29 (L) 12/18/2011 0505   GFRAA 34 (L) 12/18/2011 0505   Lab Results  Component Value Date   CHOL 296 (H) 03/26/2011   HDL 54 03/26/2011   LDLCALC 213 (H) 03/26/2011   TRIG 146 03/26/2011   CHOLHDL 5.5 03/26/2011   No results found for: HGBA1C Lab Results  Component Value Date   VITAMINB12 1,354 (H) 11/28/2011   Lab Results  Component Value Date   TSH 7.226 (H) 09/12/2011   ASSESSMENT AND PLAN 31 y.o. year old female  has a past medical history of Anemia, Brain injury, Cardiac arrest ([place]) (03/26/2011), Cardiomyopathy, CHF (congestive heart failure) ([facility]), Chronic kidney disease, Depression, Family history of adverse reaction to anesthesia, Gait disorder (01/11/2013), Heart failure, History of blood transfusion (11/2011), Hypertension, Ileitis, MPGN (membranoproliferative glomerulonephritis), type 2, Pneumonia, Renal disorder, and Seizures ([place]). here with:  1.  Seizures 2.  Anoxic Brain Injury  -Her last seizure occurred in 2013 or 2014, during a time of severe illness and cardiac arrest with anoxic brain injury related to ESRD, she wishes to try to come off the [place], had kidney transplant in 2020  -This may be reasonable, but beforehand we will check EEG, will discuss with Dr. Jannifer Franklin before we decide anything  -For now, continue Keppra 250 mg twice daily  -She has a follow-up appointment with Dr. 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

## 2021-05-29 ENCOUNTER — Encounter: Payer: Self-pay | Admitting: Neurology

## 2021-05-29 ENCOUNTER — Ambulatory Visit (INDEPENDENT_AMBULATORY_CARE_PROVIDER_SITE_OTHER): Payer: Medicaid Other | Admitting: Neurology

## 2021-05-29 ENCOUNTER — Ambulatory Visit: Payer: Medicaid Other | Admitting: Neurology

## 2021-05-29 VITALS — BP 112/80 | HR 87 | Ht 62.0 in | Wt 155.0 lb

## 2021-05-29 DIAGNOSIS — G931 Anoxic brain damage, not elsewhere classified: Secondary | ICD-10-CM | POA: Diagnosis not present

## 2021-05-29 DIAGNOSIS — G253 Myoclonus: Secondary | ICD-10-CM | POA: Diagnosis not present

## 2021-05-29 DIAGNOSIS — G40409 Other generalized epilepsy and epileptic syndromes, not intractable, without status epilepticus: Secondary | ICD-10-CM

## 2021-05-29 MED ORDER — LEVETIRACETAM 250 MG PO TABS: ORAL_TABLET | ORAL | 3 refills | Status: DC

## 2021-05-29 NOTE — Progress Notes (Signed)
PATIENT: Heather Sims DOB: 1991/01/17  REASON FOR VISIT: follow up for seizures post cardiac arrest with gait instability  HISTORY FROM: patient PRIMARY NEUROLOGIST: Dr. April Manson  HISTORY OF PRESENT ILLNESS: Today 05/29/21  Heather Sims is here today for follow-up. Post kidney transplant in 2020. Last seizure was in 2014 during times of acute illness in the hospital. On Keppra 250 mg twice daily. Using walker, no falls, learning to use cane. Still on Klonopin twice daily, helps with myoclonic jerking in the legs. Has a drivers license driving during the day, avoid night driving, highways. Living with her mom. Dr. Naaman Plummer did give a trial of Ritalin, hasn't tried it yet. 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   05/23/2020 SS: Heather Sims is a 31 year old female with history of prior cardiac arrest and anoxia of the brain with subsequent issues with gait instability, and seizures.  Reportedly her seizures occurred during time of severe illness related to her ESRD, around 2014.  She received a kidney transplant in December 2020.  She is on Keppra 250 mg twice daily.  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 Requests we continue refilling this medication going forward.  She is using a walker, but learning to use a cane.  She wishes to try to come off the [place].  She is learning to drive.  She lives with her mom.  Here today for evaluation unaccompanied.  Update 05/24/2019 SS:Heather Sims is a 31 year old female with history of prior cardiac arrest, and anoxia of the brain with subsequent issues with gait instability, and seizures.  She is taking Keppra 250 mg twice daily and is tolerating the medication well.  She has not had recurrent seizure.  She received a kidney transplant in December 2020.  She is back in school, finishing her degree in psychology.  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   Her rejection medications have possible side effect of tremor.  She has a walker for ambulation. She is learning to drive.  She wonders if she may be able to come off Keppra at some point she presents today for follow-up unaccompanied.  HISTORY 05/23/2018 SS: Heather Sims is a 31 year old female who presents for yearly follow-up with history of seizures.  She is on hemodialysis with end-stage renal disease.  She is currently taking Keppra 250 mg twice daily and is tolerating medication well.  She reports her last seizure was about 5 years ago.  She is currently on the kidney transplant list at [facility].  She does not drive a car but she reports she is learning.  She lives with her mom who brought her to this appointment today.  She denies any new problems or concerns.  She is using a walker today.  She reports she handles her own medications.  She presents today for follow-up.   REVIEW OF SYSTEMS: Out of a complete 14 system review of symptoms, the patient complains only of the following symptoms, and all other reviewed systems are negative.  See HPI  ALLERGIES: Allergies  Allergen Reactions   Pollen Extract     Sneezing, congestion denies swelling.    HOME MEDICATIONS: Outpatient Medications Prior to Visit  Medication Sig Dispense Refill   acetaminophen (TYLENOL) 325 MG tablet Take 2 tablets (650 mg total) by mouth every 4 (four) hours as needed (pain).     Cholecalciferol  125 MCG (5000 UT) capsule Take by mouth.     clonazePAM (KLONOPIN) 0.5 MG tablet Take 1 tablet (0.5 mg total) by mouth 2 (two) times daily. 60 tablet 3   ENVARSUS XR 4 MG TB24 Take 1 tablet by mouth daily.     levETIRAcetam (KEPPRA) 250 MG tablet TAKE 1 TABLET BY MOUTH TWICE DAILY. 180 tablet 3   methylphenidate (RITALIN) 5 MG tablet Take 0.5-1 tablets (2.5-5 mg total) by mouth 2 (two) times daily with breakfast and lunch. 60 tablet 0   mycophenolate (MYFORTIC) 180 MG EC tablet Take 180 mg by mouth 2 (two) times  daily. Take 4 mg twice a day     predniSONE (DELTASONE) 5 MG tablet Take 5 mg by mouth daily.     SODIUM BICARBONATE PO Take 1 tablet by mouth daily.     Specialty Vitamins Products (MAGNESIUM, AMINO ACID CHELATE,) 133 MG tablet Take by mouth.     Tacrolimus ER (ENVARSUS XR) 1 MG TB24 Take 1 mg by mouth every morning. Take 3 tablets every morning     tretinoin (RETIN-A) 0.025 % cream Apply topically at bedtime.     No facility-administered medications prior to visit.    PAST MEDICAL HISTORY: Past Medical History:  Diagnosis Date   Anemia    Brain injury    Cardiac arrest ([place]) 03/26/2011   Cardiomyopathy    CHF (congestive heart failure) (HCC)    Chronic kidney disease    [facility]- [place]   Depression    Family history of adverse reaction to anesthesia    Mother hard to wake up.   Gait disorder 01/11/2013   Heart failure    History of blood transfusion 11/2011   Hypertension    Ileitis    MPGN (membranoproliferative glomerulonephritis), type 2    Pneumonia    'walking pneumonia'   Renal disorder    Seizures ([place])    2013- last time    PAST SURGICAL HISTORY: Past Surgical History:  Procedure Laterality Date   AV FISTULA PLACEMENT  12/18/2011   Procedure: ARTERIOVENOUS (AV) FISTULA CREATION;  Surgeon: Angelia Mould, MD;  Location: [facility] OR;  Service: Vascular;  Laterality: Left;   INSERTION OF DIALYSIS CATHETER Right 08/06/2014   Procedure: INSERTION OF DIALYSIS CATHETER;  Surgeon: Elam Dutch, MD;  Location: [facility];  Service: Vascular;  Laterality: Right;   RENAL BIOPSY     REVISON OF ARTERIOVENOUS FISTULA Left 3/79/0240   Procedure: PLICATION OF ARTERIOVENOUS FISTULA;  Surgeon: Elam Dutch, MD;  Location: [facility] OR;  Service: Vascular;  Laterality: Left;   REVISON OF ARTERIOVENOUS FISTULA Left 03/12/2017   Procedure: REVISION PLICATION OF ARTERIOVENOUS FISTULA LEFT UPPER ARM;  Surgeon: Serafina Mitchell, MD;  Location: [facility] OR;  Service: Vascular;  Laterality: Left;     FAMILY HISTORY: Family History  Problem Relation Age of Onset   Hypertension Mother    Diabetes Father    Hyperlipidemia Father     SOCIAL HISTORY: Social History   Socioeconomic History   Marital status: Single    Spouse name: Not on file   Number of children: 0   Years of education: college jr   Highest education level: Not on file  Occupational History   Not on file  Tobacco Use   Smoking status: Never   Smokeless tobacco: Never  Vaping Use   Vaping Use: Never used  Substance and Sexual Activity   Alcohol use: No    Alcohol/week: 0.0 standard drinks  Drug use: No   Sexual activity: Never    Birth control/protection: Abstinence  Other Topics Concern   Not on file  Social History Narrative   Lives at home with her mother & brother   Right handed      ** Merged History Encounter **       Social Determinants of Health   Financial Resource Strain: Not on file  Food Insecurity: Not on file  Transportation Needs: Not on file  Physical Activity: Not on file  Stress: Not on file  Social Connections: Not on file  Intimate Partner Violence: Not on file   PHYSICAL EXAM  Vitals:   05/29/21 1425  BP: 112/80  Pulse: 87  Weight: 155 lb (70.3 kg)  Height: '5\' 2"'$  (1.575 m)    Body mass index is 28.35 kg/m.  Generalized: Well developed, in no acute distress  Neurological examination  Mentation: Alert oriented to time, place, history taking. Follows all commands speech and language fluent Cranial nerve II-XII: Pupils were equal round reactive to light. Extraocular movements were full, visual field were full on confrontational test. Facial sensation and strength were normal. Head turning and shoulder shrug were normal and symmetric. Motor: Good strength of all extremities, mild increased spasticity in lower extremities. Sensory: Sensory testing is intact to soft touch on all 4 extremities. No evidence of extinction is noted.  Coordination: Cerebellar testing  reveals good finger-nose-finger and heel-to-shin bilaterally. No tremor noted. Gait and station: Gait is wide-based, steady with walker, lower extremities are somewhat spastic Reflexes: Deep tendon reflexes are symmetric but increased   DIAGNOSTIC DATA (LABS, IMAGING, TESTING) - I reviewed patient records, labs, notes, testing and imaging myself where available.  Lab Results  Component Value Date   WBC 6.6 12/18/2011   HGB 13.3 03/12/2017   HCT 39.0 03/12/2017   MCV 90.9 12/18/2011   PLT 244 12/18/2011      Component Value Date/Time   NA 137 03/12/2017 1024   K 4.8 03/12/2017 1024   CL 99 12/18/2011 0505   CO2 27 12/18/2011 0505   GLUCOSE 80 03/12/2017 1024   BUN 17 12/18/2011 0505   CREATININE 2.30 (H) 12/18/2011 0505   CALCIUM 9.4 12/18/2011 0505   CALCIUM 8.4 10/28/2011 1327   PROT 6.1 11/29/2011 0224   ALBUMIN 2.4 (L) 12/18/2011 0505   AST 56 (H) 11/29/2011 0224   ALT 6 12/01/2011 2330   ALKPHOS 62 11/29/2011 0224   BILITOT 0.4 11/29/2011 0224   GFRNONAA 29 (L) 12/18/2011 0505   GFRAA 34 (L) 12/18/2011 0505   Lab Results  Component Value Date   CHOL 296 (H) 03/26/2011   HDL 54 03/26/2011   LDLCALC 213 (H) 03/26/2011   TRIG 146 03/26/2011   CHOLHDL 5.5 03/26/2011   No results found for: HGBA1C Lab Results  Component Value Date   VITAMINB12 1,354 (H) 11/28/2011   Lab Results  Component Value Date   TSH 7.226 (H) 09/12/2011   ASSESSMENT AND PLAN 31 y.o. year old female  has a past medical history of Anemia, Brain injury, Cardiac arrest ([place]) (03/26/2011), Cardiomyopathy, CHF (congestive heart failure) ([place]), Chronic kidney disease, Depression, Family history of adverse reaction to anesthesia, Gait disorder (01/11/2013), Heart failure, History of blood transfusion (11/2011), Hypertension, Ileitis, MPGN (membranoproliferative glomerulonephritis), type 2, Pneumonia, Renal disorder, and Seizures ([place]). here with:  1.  Seizures 2.  Anoxic Brain Injury 3.   Myoclonus  -Plan to taper off Keppra, currently taking 250 mg twice daily, cut back to  250 mg daily x 2 weeks then stop, but have recommended she refrain from driving for 6 months once stopping  -EEG was normal in April 2022  -Her last seizure occurred in 2013 or 2014, during a time of severe illness and cardiac arrest with anoxic brain injury related to ESRD, kidney transplant in 2020  -On Klonopin 0.5 mg twice daily for myoclonus of the legs, started by Dr. Naaman Plummer, we discussed moving this prescription to PCP, can return here PRN once tapered off Keppra   -Follow-up 1 year or sooner if needed  Butler Denmark, Laqueta Jean, DNP 05/29/2021, 2:33 PM [facility] [address] [place], [place] [address] [phone]

## 2021-06-02 DIAGNOSIS — Z79899 Other long term (current) drug therapy: Principal | ICD-10-CM

## 2021-06-02 DIAGNOSIS — Z1159 Encounter for screening for other viral diseases: Principal | ICD-10-CM

## 2021-06-02 DIAGNOSIS — Z940 Kidney transplant status: Principal | ICD-10-CM

## 2021-06-05 ENCOUNTER — Other Ambulatory Visit: Payer: Self-pay | Admitting: Neurology

## 2021-06-05 MED ORDER — LEVETIRACETAM 250 MG TABLET
ORAL_TABLET | ORAL | 3 refills | 0 days
Start: 2021-06-05 — End: ?

## 2021-06-05 NOTE — Unmapped (Signed)
[facility] Refill Coordination Note    Specialty Medication(s) to be Shipped:   Transplant: Envarsus 1mg , Envarsus 4mg ,  mycophenolic acid 180mg  and Prednisone 5mg     Other medication(s) to be shipped:      keppra  Sodium bicarb  Mag       Carolyn Kelley, DOB: January 15, 1991  Phone: [phone] (home)       All above HIPAA information was verified with patient.     Was a Nurse, learning disability used for this call? No    Completed refill call assessment today to schedule patient's medication shipment from the [facility] [phone]).  All relevant notes have been reviewed.     Specialty medication(s) and dose(s) confirmed: Regimen is correct and unchanged.   Changes to medications: Tori reports no changes at this time.  Changes to insurance: No  New side effects reported not previously addressed with a pharmacist or physician: None reported  Questions for the pharmacist: No    Confirmed patient received a Conservation officer, historic buildings and a Surveyor, mining with first shipment. The patient will receive a drug information handout for each medication shipped and additional FDA Medication Guides as required.       DISEASE/MEDICATION-SPECIFIC INFORMATION        N/A    SPECIALTY MEDICATION ADHERENCE     Medication Adherence    Patient reported X missed doses in the last month: 0  Specialty Medication: ENVARSUS XR 1 mg Tb24 extended release tablet (tacrolimus)  Patient is on additional specialty medications: Yes  Additional Specialty Medications: ENVARSUS XR 4 mg Tb24 extended release tablet (tacrolimus)  Patient Reported Additional Medication X Missed Doses in the Last Month: 0  Patient is on more than two specialty medications: Yes  Specialty Medication: mycophenolate 180 MG EC tablet (MYFORTIC)  Patient Reported Additional Medication X Missed Doses in the Last Month: 0  Specialty Medication: predniSONE 5 MG tablet (DELTASONE)  Patient Reported Additional Medication X Missed Doses in the Last Month: 0      envarsus 1mg  : 8 days of medicine on hand   envarsus 4mg  : 8 days of medicine on hand   Mycophenolate 180mg  : 8 days of medicine on hand   Prednisone 5mg  : 8 days of medicine on hand           Were doses missed due to medication being on hold? No      REFERRAL TO PHARMACIST     Referral to the pharmacist: Not needed      [facility]     Shipping address confirmed in Epic.     Delivery Scheduled: Yes, Expected medication delivery date: 06/10/21.     Medication will be delivered via UPS to the prescription address in Epic WAM.    [place] A Diavion Labrador   [facility] Pharmacy Specialty Technician

## 2021-06-09 MED FILL — SODIUM BICARBONATE 650 MG TABLET: ORAL | 60 days supply | Qty: 120 | Fill #2

## 2021-06-09 MED FILL — PREDNISONE 5 MG TABLET: ORAL | 30 days supply | Qty: 30 | Fill #5

## 2021-06-09 MED FILL — ENVARSUS XR 4 MG TABLET,EXTENDED RELEASE: ORAL | 30 days supply | Qty: 30 | Fill #3

## 2021-06-09 MED FILL — MYCOPHENOLATE SODIUM 180 MG TABLET,DELAYED RELEASE: ORAL | 30 days supply | Qty: 240 | Fill #6

## 2021-06-09 MED FILL — MG-PLUS-PROTEIN 133 MG TABLET: ORAL | 30 days supply | Qty: 120 | Fill #3

## 2021-06-09 MED FILL — ENVARSUS XR 1 MG TABLET,EXTENDED RELEASE: ORAL | 30 days supply | Qty: 90 | Fill #4

## 2021-06-23 ENCOUNTER — Other Ambulatory Visit: Payer: Self-pay | Admitting: Neurology

## 2021-06-23 DIAGNOSIS — G253 Myoclonus: Secondary | ICD-10-CM

## 2021-06-23 DIAGNOSIS — Z940 Kidney transplant status: Principal | ICD-10-CM

## 2021-06-24 LAB — CBC W/ DIFFERENTIAL
BANDED NEUTROPHILS ABSOLUTE COUNT: 0.1 10*3/uL (ref 0.0–0.1)
BASOPHILS ABSOLUTE COUNT: 0 10*3/uL (ref 0.0–0.2)
BASOPHILS RELATIVE PERCENT: 1 %
EOSINOPHILS ABSOLUTE COUNT: 0.1 10*3/uL (ref 0.0–0.4)
EOSINOPHILS RELATIVE PERCENT: 3 %
HEMATOCRIT: 36 % (ref 34.0–46.6)
HEMOGLOBIN: 11.7 g/dL (ref 11.1–15.9)
IMMATURE GRANULOCYTES: 2 %
LYMPHOCYTES ABSOLUTE COUNT: 0.9 10*3/uL (ref 0.7–3.1)
LYMPHOCYTES RELATIVE PERCENT: 28 %
MEAN CORPUSCULAR HEMOGLOBIN CONC: 32.5 g/dL (ref 31.5–35.7)
MEAN CORPUSCULAR HEMOGLOBIN: 30.5 pg (ref 26.6–33.0)
MEAN CORPUSCULAR VOLUME: 94 fL (ref 79–97)
MONOCYTES ABSOLUTE COUNT: 0.6 10*3/uL (ref 0.1–0.9)
MONOCYTES RELATIVE PERCENT: 18 %
NEUTROPHILS ABSOLUTE COUNT: 1.6 10*3/uL (ref 1.4–7.0)
NEUTROPHILS RELATIVE PERCENT: 48 %
PLATELET COUNT: 196 10*3/uL (ref 150–450)
RED BLOOD CELL COUNT: 3.84 x10E6/uL (ref 3.77–5.28)
RED CELL DISTRIBUTION WIDTH: 12.7 % (ref 11.7–15.4)
WHITE BLOOD CELL COUNT: 3.3 10*3/uL — ABNORMAL LOW (ref 3.4–10.8)

## 2021-06-25 LAB — BASIC METABOLIC PANEL
BLOOD UREA NITROGEN: 29 mg/dL — ABNORMAL HIGH (ref 6–20)
BUN / CREAT RATIO: 32 — ABNORMAL HIGH (ref 9–23)
CALCIUM: 10.1 mg/dL (ref 8.7–10.2)
CHLORIDE: 104 mmol/L (ref 96–106)
CO2: 21 mmol/L (ref 20–29)
CREATININE: 0.9 mg/dL (ref 0.57–1.00)
EGFR: 87 mL/min/{1.73_m2}
GLUCOSE: 83 mg/dL (ref 70–99)
POTASSIUM: 4.6 mmol/L (ref 3.5–5.2)
SODIUM: 138 mmol/L (ref 134–144)

## 2021-06-25 LAB — MAGNESIUM: MAGNESIUM: 1.5 mg/dL — ABNORMAL LOW (ref 1.6–2.3)

## 2021-06-25 LAB — PHOSPHORUS: PHOSPHORUS, SERUM: 2.9 mg/dL — ABNORMAL LOW (ref 3.0–4.3)

## 2021-06-26 LAB — TACROLIMUS LEVEL: TACROLIMUS (FK506),BLOOD: 7 ng/mL (ref 2.0–20.0)

## 2021-06-26 NOTE — Unmapped (Signed)
Patient called asking if she can get a referral to a gyn for IUD placement. She would like to see local provider    Patient will research who she would like to see and let me know    Denies any other needs

## 2021-06-27 ENCOUNTER — Other Ambulatory Visit: Payer: Self-pay | Admitting: Neurology

## 2021-06-27 DIAGNOSIS — G253 Myoclonus: Secondary | ICD-10-CM

## 2021-06-27 NOTE — Telephone Encounter (Signed)
Pt is needing a refill request for her clonazePAM (KLONOPIN) 0.5 MG tablet sent in to the [facility] on [address] ?

## 2021-06-30 ENCOUNTER — Telehealth: Payer: Self-pay | Admitting: Neurology

## 2021-06-30 DIAGNOSIS — G253 Myoclonus: Secondary | ICD-10-CM

## 2021-06-30 DIAGNOSIS — Z940 Kidney transplant status: Principal | ICD-10-CM

## 2021-06-30 DIAGNOSIS — Z1159 Encounter for screening for other viral diseases: Principal | ICD-10-CM

## 2021-06-30 DIAGNOSIS — Z79899 Other long term (current) drug therapy: Principal | ICD-10-CM

## 2021-06-30 NOTE — Telephone Encounter (Signed)
Verify Drug Registry For Clonazepam 0.5 Mg Tablet ?Last Filled: 05/29/2021 ?Quantity: 60 tablets for 30 days ?Last appointment: 05/29/2021 ?Next appointment: 06/02/2022 ?

## 2021-07-01 MED ORDER — CLONAZEPAM 0.5 MG PO TABS: 0.5000 mg | ORAL_TABLET | Freq: Two times a day (BID) | ORAL | 3 refills | Status: DC

## 2021-07-01 NOTE — Telephone Encounter (Signed)
I spoke to the patient. She is aware Sarah sent in clonazepam refills for three months. The patient is no longer having to follow up with our office. Any future refills of this medication will need to be provided by her PCP office. Sarah asked for this information and notes be sent to that office. Per patient, she is seen at [facility] (ph: [phone], fax: [phone]). Request completed. ?

## 2021-07-01 NOTE — Telephone Encounter (Signed)
Pt would like a call back to know how to get medication held. Pt states she can get medication from another Dr. After March of next year. Would like a call back as soon as possible.  ?

## 2021-07-01 NOTE — Telephone Encounter (Signed)
I will refill Klonopin for 3 months, but this will need to come from PCP in future since we are weaning off Keppra, she will return here PRN basis. Can we go ahead and send our notes to PCP so they are aware of plan? Thanks.  ?

## 2021-07-01 NOTE — Unmapped (Signed)
Returned VM to patient to let her know she can just send me the GYN office name and I will send in a referral for her. Sent mychart message too

## 2021-07-04 ENCOUNTER — Ambulatory Visit
Admit: 2021-07-04 | Discharge: 2021-07-05 | Payer: PRIVATE HEALTH INSURANCE | Attending: Nephrology | Primary: Nephrology

## 2021-07-04 ENCOUNTER — Ambulatory Visit: Admit: 2021-07-04 | Discharge: 2021-07-05 | Payer: PRIVATE HEALTH INSURANCE

## 2021-07-04 DIAGNOSIS — Z940 Kidney transplant status: Principal | ICD-10-CM

## 2021-07-04 DIAGNOSIS — N05A C3 glomerulonephritis of transplanted kidney: Principal | ICD-10-CM

## 2021-07-04 DIAGNOSIS — I10 Essential (primary) hypertension: Principal | ICD-10-CM

## 2021-07-04 DIAGNOSIS — D849 Immunodeficiency, unspecified: Principal | ICD-10-CM

## 2021-07-04 DIAGNOSIS — T8619 Other complication of kidney transplant: Principal | ICD-10-CM

## 2021-07-04 LAB — URINALYSIS WITH MICROSCOPY
BILIRUBIN UA: NEGATIVE
GLUCOSE UA: NEGATIVE
KETONES UA: NEGATIVE
LEUKOCYTE ESTERASE UA: NEGATIVE
NITRITE UA: NEGATIVE
PH UA: 6 (ref 5.0–9.0)
PROTEIN UA: NEGATIVE
RBC UA: 4 /HPF — ABNORMAL HIGH (ref <4)
SPECIFIC GRAVITY UA: 1.02 (ref 1.005–1.030)
SQUAMOUS EPITHELIAL: 28 /HPF — ABNORMAL HIGH (ref 0–5)
UROBILINOGEN UA: 0.2
WBC UA: 2 /HPF (ref 0–5)

## 2021-07-04 LAB — CBC W/ AUTO DIFF
BASOPHILS ABSOLUTE COUNT: 0 10*9/L (ref 0.0–0.1)
BASOPHILS RELATIVE PERCENT: 1.1 %
EOSINOPHILS ABSOLUTE COUNT: 0.1 10*9/L (ref 0.0–0.5)
EOSINOPHILS RELATIVE PERCENT: 2.3 %
HEMATOCRIT: 33.9 % — ABNORMAL LOW (ref 34.0–44.0)
HEMOGLOBIN: 11.1 g/dL — ABNORMAL LOW (ref 11.3–14.9)
LYMPHOCYTES ABSOLUTE COUNT: 1.4 10*9/L (ref 1.1–3.6)
LYMPHOCYTES RELATIVE PERCENT: 31.9 %
MEAN CORPUSCULAR HEMOGLOBIN CONC: 32.8 g/dL (ref 32.0–36.0)
MEAN CORPUSCULAR HEMOGLOBIN: 29.9 pg (ref 25.9–32.4)
MEAN CORPUSCULAR VOLUME: 91.2 fL (ref 77.6–95.7)
MEAN PLATELET VOLUME: 8.1 fL (ref 6.8–10.7)
MONOCYTES ABSOLUTE COUNT: 0.7 10*9/L (ref 0.3–0.8)
MONOCYTES RELATIVE PERCENT: 15.3 %
NEUTROPHILS ABSOLUTE COUNT: 2.1 10*9/L (ref 1.8–7.8)
NEUTROPHILS RELATIVE PERCENT: 49.4 %
NUCLEATED RED BLOOD CELLS: 0 /100{WBCs} (ref <=4)
PLATELET COUNT: 191 10*9/L (ref 150–450)
RED BLOOD CELL COUNT: 3.72 10*12/L — ABNORMAL LOW (ref 3.95–5.13)
RED CELL DISTRIBUTION WIDTH: 13.9 % (ref 12.2–15.2)
WBC ADJUSTED: 4.3 10*9/L (ref 3.6–11.2)

## 2021-07-04 LAB — BASIC METABOLIC PANEL
ANION GAP: 6 mmol/L (ref 5–14)
BLOOD UREA NITROGEN: 36 mg/dL — ABNORMAL HIGH (ref 9–23)
BUN / CREAT RATIO: 36
CALCIUM: 10.3 mg/dL (ref 8.7–10.4)
CHLORIDE: 106 mmol/L (ref 98–107)
CO2: 24.8 mmol/L (ref 20.0–31.0)
CREATININE: 0.99 mg/dL — ABNORMAL HIGH
EGFR CKD-EPI (2021) FEMALE: 78 mL/min/{1.73_m2} (ref >=60)
GLUCOSE RANDOM: 87 mg/dL (ref 70–99)
POTASSIUM: 4.8 mmol/L (ref 3.4–4.8)
SODIUM: 137 mmol/L (ref 135–145)

## 2021-07-04 LAB — C3 COMPLEMENT: C3 COMPLEMENT: 79 mg/dL — ABNORMAL LOW (ref 90–170)

## 2021-07-04 LAB — TACROLIMUS LEVEL, TROUGH: TACROLIMUS, TROUGH: 4.3 ng/mL — ABNORMAL LOW (ref 5.0–15.0)

## 2021-07-04 LAB — PHOSPHORUS: PHOSPHORUS: 3.8 mg/dL (ref 2.4–5.1)

## 2021-07-04 LAB — PROTEIN / CREATININE RATIO, URINE
CREATININE, URINE: 99.7 mg/dL
PROTEIN URINE: 12 mg/dL
PROTEIN/CREAT RATIO, URINE: 0.12

## 2021-07-04 LAB — MAGNESIUM: MAGNESIUM: 1.6 mg/dL (ref 1.6–2.6)

## 2021-07-04 LAB — SLIDE REVIEW

## 2021-07-04 LAB — C4 COMPLEMENT: C4 COMPLEMENT: 26.4 mg/dL (ref 12.0–36.0)

## 2021-07-04 NOTE — Unmapped (Signed)
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 She is seen in follow up of her transplant and to address other medical problems. 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   - Genetics panel was negative for causative variants 08/30/20.     - Treatment included an increase in the dose of Myfortic to 720 mg bid and initiation of prednisone 5 mg daily  - Serum creatinine has remained stable at <1.3 mg/dl (2.95 mg/dL today)  - UPC has normalized (0.240g/g on 03/27/21) with peak value of 0.966 on 03/29/20. UA with trace blood today, stable.  - Rituximab was deferred at the time of recurrence diagnosis due to an active COVID-19 community spike and concern about infection risk. With improvement on current immunosuppression we have not reconsidered rituximab.  - She has been prepared for use of Eculizumab in case current therapy or rituximab proves ineffective in the future. This included completion of pre-eculizumab vaccination     - Repeat C3, C4, C3 glomerulopathy functional panel periodically. C3 and C4 are pending today.  - Continue current immunosuppression for now. No plans for rituximab or eculizumab.    Immunosuppression Management [High Risk Medical Decision Making For Drug Therapy Requiring Intensive Monitoring For Toxicity]:   - Tacrolimus level pending (target trough is 6-10 ng/mL).   - Has tremors and headaches that preceded initiation of Envarsus.  - Envarsus 7 mg daily dose to be continued pending today's trough level  - Continue Myfortic 720 mg bid due to C3 glomerulopathy recurrence  - Prednisone 5 mg daily due to C3 glomerulopathy recurrence, steroid free prior to C3G diagnosis    Infectious Prophylaxis and Monitoring: CMV & EBV: D+/R+  - PJP prophylaxis: completed (s/p Bactrim x 6 months)  - CMV prophylaxis: completed (s/p Valcyte x3 months).   - COVID-19 vaccination: completed 5 doses including bivalent booster today. She is aware of the limited efficacy of this vaccine in transplant recipients.   - Received Evusheld 08/30/20  - Received Men B on 01/26/20,03/01/20, 03/21/20  - Received MenACWY 01/26/20, needs additional dose - pt will get locally, agan discussed with pt.today  - Booster doses of meningococcal vaccine every 1-5 years per CDC recommendations if chronic eculizumab therapy is initiated.    Seizures Disorder, no seizures since 2014:    - EEG with no evidence of seizures on 06/25/20 .   - Keppra taper is being initiated by Neurology team.  - She will follow up with [name], NP at [facility].       Myoclonus:   Will continue Klonopin, prescribed by PCP.  Continue [facility] physical therapy.      Metabolic Acidosis and Hyperkalemia, resolved with therapy  CO2 is now normal (24.8) and K is 4.8  Sodium bicarbonate 650 mg bid to be continued.    Pseudoaneurysm of AVF  She saw Dr. Norma Fredrickson 09/21/19 with plan to defer any intervention until 1 year has passed from transplant. She has no high output heart failure by echo or symptoms of CHF. We would favor a nonurgent revision to minimize any risk of rupture, but spare the  AVF for future use if possible to do so safely. Patient has been advised to contact us with any scabbing or erosion of the skin over the pseudoaneurysm.    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    Pap Smear: 11/11/20 was negative including negative HPV  She will follow up with GYN on August 21, 2021 Follow up  3 months    History of Present Illness  Carolyn Kelley is a 31 y.o. 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 Her post-operative course was complicated by hypotension and hypoxia thought to be caused by an anaphylactic reaction to Campath. She has experienced no episodes of rejection or infectious complications. Due to presence of proteinuria she underwent kidney biopsy on 12/29/19. The biopsy showed C3 glomerulopathy recurrence. In response to this diagnosis Myfortic was increased from 540 mg bid to 720 mg bid and she was started on prednisone 5 mg daily while tacrolimus was continued.    She presents today without new complaints. Tremors and headaches are somewhat worse than baseline. She denies seizures and her Neurologist is planning to taper her Keppra. She denies chest pain, shortness of breath, edema, fever, chills, cough, or allograft pain/tenderness. BP has not been checked. Muscle spasms are controlled with Klonopin.     Last dose of Envarsus: 11AM yesterday    Transplant History:  Date of Transplant: 03/10/19  Organ Received: Left DDKT, DBD, SCD, KDPI 2%; cold ischemia 18 hrs.  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    Medications  Current Outpatient Medications   Medication Sig Dispense Refill    acetaminophen (TYLENOL) 500 MG tablet Take 1-2 tablets (500-1,000 mg total) by mouth every six (6) hours as needed for pain or fever (> 38C). 100 tablet 0    cholecalciferol, vitamin D3-125 mcg, 5,000 unit,, 125 mcg (5,000 unit) capsule Take 1 capsule (125 mcg total) by mouth daily. 100 capsule 11    clonazePAM (KLONOPIN) 0.5 MG tablet Take 1 tablet (0.5 mg total) by mouth two (2) times a day. 60 tablet 3    levETIRAcetam (KEPPRA) 250 MG tablet TAKE 1 TABLET BY MOUTH TWICE DAILY. 180 tablet 3    magnesium oxide-Mg AA chelate (MAGNESIUM, AMINO ACID CHELATE,) 133 mg Take 2 tablets by mouth Two (2) times a day. 120 tablet 11    mycophenolate (MYFORTIC) 180 MG EC tablet Take 4 tablets (720 mg total) by mouth Two (2) times a day. Adjust dose per medication card. 240 tablet 11    predniSONE (DELTASONE) 5 MG tablet Take 1 tablet (5 mg total) by mouth daily. 30 tablet 11    sodium bicarbonate 650 mg tablet Take 1 tablet (650 mg total) by mouth Two (2)  times a day. 120 tablet 11    tacrolimus (ENVARSUS XR) 1 mg Tb24 extended release tablet Take three (1mg ) tablets with one (4mg ) tablet by mouth daily for a total daily dose of 7 mg. 90 tablet 11    tacrolimus (ENVARSUS XR) 4 mg Tb24 extended release tablet Take one (4mg ) tablet with three (1mg ) tablets by mouth daily for a total daily dose of 7 mg. 30 tablet 11    tretinoin (RETIN-A) 0.025 % cream Start every other night and increase to nightly as tolerated 45 g 3     No current facility-administered medications for this visit.       Physical Exam   BP 117/81 (BP Site: R Arm, BP Position: Sitting, BP Cuff Size: Large)  - Pulse 81  - Temp 37.2 ??C (98.9 ??F) (Temporal)  - Ht 157.5 cm (5' 2)  - Wt 70.5 kg (155 lb 6.4 oz)  - LMP  (LMP Unknown)  - BMI 28.42 kg/m??    General: Patient is a pleasant female in no apparent distress.  Eyes: Sclera anicteric.  Neck:  trachea midline .  Lungs: Clear to auscultation bilaterally, no wheezes/rales/rhonchi.  Cardiovascular: Regular rate and rhythm without murmurs, rubs or gallops.  Abdomen:  Soft nontender/nondistended .  Extremities: Without edema, joints without evidence of synovitis; pseudoaneurysm of AVF LUE  Skin: Without rash  Neurological: Grossly nonfocal; uses a walker for ambulation due to myoclonus. Left leg myoclonus noted.  Psychiatric: Mood and affect appropriate.      Laboratory Results and imaging Reviewed in EMR

## 2021-07-04 NOTE — Unmapped (Signed)
Saw patient in clinic. Reports she is doing good.    Referral placed to [facility] for GYN visit and possible IUD placement    Drinking 8-10 cups    BP not checking at home    Sleep is OK    Appetite good    +HA couple times a week, no change since last visit. Uses tylenol    +tremors same     Denies dizziness, CP, heart palpitations, SOB, abdominal pain, n/v/d, urinary problems, swelling, numbness/tingling, or fevers    Labs today, last dose of envarsus at 11am

## 2021-07-04 NOTE — Unmapped (Signed)
Pt ID verified with Name and Date of birth. The EUA Covid-19 Fact Sheet given to patient. All screening questions answered. Verbal consent taken. Vaccine administered as ordered.  See immunization history for documentation.  Pt tolerated well with no issues noted. Recommend that patient stay in observation area for 15 minutes.

## 2021-07-05 LAB — CMV DNA, QUANTITATIVE, PCR: CMV VIRAL LD: NOT DETECTED

## 2021-07-07 LAB — BK VIRUS QUANTITATIVE PCR, BLOOD: BK BLOOD RESULT: NOT DETECTED

## 2021-07-07 NOTE — Unmapped (Signed)
[facility] Refill Coordination Note    Specialty Medication(s) to be Shipped:   Transplant: Envarsus 1mg , Envarsus 4mg ,  mycophenolic acid 180mg  and Prednisone 5mg     Other medication(s) to be shipped:    Carolyn Kelley, DOB: 1990-05-24  Phone: [phone] (home)       All above HIPAA information was verified with patient.     Was a Nurse, learning disability used for this call? No    Completed refill call assessment today to schedule patient's medication shipment from the [facility] [phone]).  All relevant notes have been reviewed.     Specialty medication(s) and dose(s) confirmed: Regimen is correct and unchanged.   Changes to medications: Ruben reports no changes at this time.  Changes to insurance: No  New side effects reported not previously addressed with a pharmacist or physician: None reported  Questions for the pharmacist: No    Confirmed patient received a Conservation officer, historic buildings and a Surveyor, mining with first shipment. The patient will receive a drug information handout for each medication shipped and additional FDA Medication Guides as required.       DISEASE/MEDICATION-SPECIFIC INFORMATION        N/A    SPECIALTY MEDICATION ADHERENCE     Medication Adherence    Patient reported X missed doses in the last month: 0  Specialty Medication: Envarsus 1mg   Patient is on additional specialty medications: Yes  Additional Specialty Medications: Envarsus 4mg   Patient Reported Additional Medication X Missed Doses in the Last Month: 0  Patient is on more than two specialty medications: Yes  Specialty Medication: Mycophenolate 180mg   Patient Reported Additional Medication X Missed Doses in the Last Month: 0  Specialty Medication: Prednisone 5mg   Patient Reported Additional Medication X Missed Doses in the Last Month: 0       Envarsus 1mg  : 6 days of medicine on hand   Envarsus 4mg  : 6 days of medicine on hand   Mycophenolate 180mg  : 6 days of medicine on hand   Prednisone 5mg  : 6 days of medicine on hand     Were doses missed due to medication being on hold? No    REFERRAL TO PHARMACIST     Referral to the pharmacist: Not needed      [facility]     Shipping address confirmed in Epic.     Delivery Scheduled: Yes, Expected medication delivery date: 07/10/2021.     Medication will be delivered via UPS to the prescription address in Epic WAM.    [name] [facility] Specialty Technician

## 2021-07-09 MED FILL — PREDNISONE 5 MG TABLET: ORAL | 30 days supply | Qty: 30 | Fill #6

## 2021-07-09 MED FILL — MG-PLUS-PROTEIN 133 MG TABLET: ORAL | 30 days supply | Qty: 120 | Fill #4

## 2021-07-09 MED FILL — MYCOPHENOLATE SODIUM 180 MG TABLET,DELAYED RELEASE: ORAL | 30 days supply | Qty: 240 | Fill #7

## 2021-07-09 MED FILL — ENVARSUS XR 4 MG TABLET,EXTENDED RELEASE: ORAL | 30 days supply | Qty: 30 | Fill #4

## 2021-07-09 MED FILL — ENVARSUS XR 1 MG TABLET,EXTENDED RELEASE: ORAL | 30 days supply | Qty: 90 | Fill #5

## 2021-07-23 ENCOUNTER — Encounter: Payer: Self-pay | Admitting: Physical Medicine & Rehabilitation

## 2021-07-23 ENCOUNTER — Encounter: Payer: Medicaid Other | Attending: Physical Medicine & Rehabilitation | Admitting: Physical Medicine & Rehabilitation

## 2021-07-23 DIAGNOSIS — G253 Myoclonus: Secondary | ICD-10-CM | POA: Diagnosis present

## 2021-07-23 DIAGNOSIS — L700 Acne vulgaris: Principal | ICD-10-CM

## 2021-07-23 DIAGNOSIS — L709 Acne, unspecified: Principal | ICD-10-CM

## 2021-07-23 LAB — CBC W/ DIFFERENTIAL
BANDED NEUTROPHILS ABSOLUTE COUNT: 0.1 10*3/uL (ref 0.0–0.1)
BASOPHILS ABSOLUTE COUNT: 0 10*3/uL (ref 0.0–0.2)
BASOPHILS RELATIVE PERCENT: 1 %
EOSINOPHILS ABSOLUTE COUNT: 0.1 10*3/uL (ref 0.0–0.4)
EOSINOPHILS RELATIVE PERCENT: 4 %
HEMATOCRIT: 35 % (ref 34.0–46.6)
HEMOGLOBIN: 11.3 g/dL (ref 11.1–15.9)
IMMATURE GRANULOCYTES: 2 %
LYMPHOCYTES ABSOLUTE COUNT: 0.9 10*3/uL (ref 0.7–3.1)
LYMPHOCYTES RELATIVE PERCENT: 25 %
MEAN CORPUSCULAR HEMOGLOBIN CONC: 32.3 g/dL (ref 31.5–35.7)
MEAN CORPUSCULAR HEMOGLOBIN: 30 pg (ref 26.6–33.0)
MEAN CORPUSCULAR VOLUME: 93 fL (ref 79–97)
MONOCYTES ABSOLUTE COUNT: 0.5 10*3/uL (ref 0.1–0.9)
MONOCYTES RELATIVE PERCENT: 13 %
NEUTROPHILS ABSOLUTE COUNT: 2 10*3/uL (ref 1.4–7.0)
NEUTROPHILS RELATIVE PERCENT: 55 %
PLATELET COUNT: 205 10*3/uL (ref 150–450)
RED BLOOD CELL COUNT: 3.77 x10E6/uL (ref 3.77–5.28)
RED CELL DISTRIBUTION WIDTH: 12.9 % (ref 11.7–15.4)
WHITE BLOOD CELL COUNT: 3.6 10*3/uL (ref 3.4–10.8)

## 2021-07-23 LAB — PHOSPHORUS: PHOSPHORUS, SERUM: 3.1 mg/dL (ref 3.0–4.3)

## 2021-07-23 LAB — BASIC METABOLIC PANEL
BLOOD UREA NITROGEN: 29 mg/dL — ABNORMAL HIGH (ref 6–20)
BUN / CREAT RATIO: 31 — ABNORMAL HIGH (ref 9–23)
CALCIUM: 10 mg/dL (ref 8.7–10.2)
CHLORIDE: 104 mmol/L (ref 96–106)
CO2: 22 mmol/L (ref 20–29)
CREATININE: 0.95 mg/dL (ref 0.57–1.00)
EGFR: 82 mL/min/{1.73_m2}
GLUCOSE: 78 mg/dL (ref 70–99)
POTASSIUM: 4.6 mmol/L (ref 3.5–5.2)
SODIUM: 137 mmol/L (ref 134–144)

## 2021-07-23 LAB — MAGNESIUM: MAGNESIUM: 1.8 mg/dL (ref 1.6–2.3)

## 2021-07-23 MED ORDER — TRETINOIN 0.025 % TOPICAL CREAM
Freq: Every evening | TOPICAL | 3 refills | 0.00000 days | Status: CP
Start: 2021-07-23 — End: 2022-07-23

## 2021-07-23 NOTE — Patient Instructions (Addendum)
ABOUT A MONTH AFTER YOU ARE OFF KEPPRA, PLEASE CUT YOUR KLONOPIN TO 0.'25MG'$  (HALF TABLET) TWICE DAILY.  ?

## 2021-07-23 NOTE — Progress Notes (Signed)
? ?Subjective:  ? ? Patient ID: Heather Sims, female    DOB: June 24, 1990, 31 y.o.   MRN: [id number] ? ?HPI ? ?Heather Sims is here in follow up of her anoxic brain injury. She is in school at [employer] and is working to finish her degree. She is taking exams now. She is about 3-4 courses from finishing. 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  ? ?We prescribed ritalin but she didn't want to take it due to concerns over addiction.  She feels that her attention and energy are reasonable while she is in class but does find that when she studies later or later in the day that there is more of an issue.  She does drink coffee which does help to extent. ? ?She maintains on keppra and klonopin as prescribed. She hasn't had any seizures. Neurology is currently weaning her off keppra.  ? ?She is only having myoclonus when she's fatigued at the end of the day or if she's stressed. She has been driving.  ? ? ? ?Pain Inventory ?Average Pain 0 ?Pain Right Now 0 ?My pain is  n/a ? ?In the last 24 hours, has pain interfered with the following? ?General activity 0 ?Relation with others 0 ?Enjoyment of life 0 ?What TIME of day is your pain at its worst? varies ?Sleep (in general) Fair ? ?Pain is worse with:  n/a ?Pain improves with:  n/a ?Relief from Meds:  no pain meds ? ?Family History  ?Problem Relation Age of Onset  ? Hypertension Mother   ? Diabetes Father   ? Hyperlipidemia Father   ? ?Social History  ? ?Socioeconomic History  ? Marital status: Single  ?  Spouse name: Not on file  ? Number of children: 0  ? Years of education: college jr  ? Highest education level: Not on file  ?Occupational History  ? Not on file  ?Tobacco Use  ? Smoking status: Never  ? Smokeless tobacco: Never  ?Vaping Use  ? Vaping Use: Never used  ?Substance and Sexual Activity  ? Alcohol use: No  ?  Alcohol/week: 0.0 standard drinks  ? Drug use: No  ? Sexual activity: Never  ?  Birth control/protection: Abstinence  ?Other Topics Concern  ? Not on file  ?Social History  Narrative  ? Lives at home with her mother & brother  ? Right handed  ?   ? ** Merged History Encounter **  ?    ? ?Social Determinants of Health  ? ?Financial Resource Strain: Not on file  ?Food Insecurity: Not on file  ?Transportation Needs: Not on file  ?Physical Activity: Not on file  ?Stress: Not on file  ?Social Connections: Not on file  ? ?Past Surgical History:  ?Procedure Laterality Date  ? AV FISTULA PLACEMENT  12/18/2011  ? Procedure: ARTERIOVENOUS (AV) FISTULA CREATION;  Surgeon: Angelia Mould, MD;  Location: [place];  Service: Vascular;  Laterality: Left;  ? INSERTION OF DIALYSIS CATHETER Right 08/06/2014  ? Procedure: INSERTION OF DIALYSIS CATHETER;  Surgeon: Elam Dutch, MD;  Location: [place];  Service: Vascular;  Laterality: Right;  ? RENAL BIOPSY    ? REVISON OF ARTERIOVENOUS FISTULA Left 08/06/2014  ? Procedure: PLICATION OF ARTERIOVENOUS FISTULA;  Surgeon: Elam Dutch, MD;  Location: [place];  Service: Vascular;  Laterality: Left;  ? REVISON OF ARTERIOVENOUS FISTULA Left 03/12/2017  ? Procedure: REVISION PLICATION OF ARTERIOVENOUS FISTULA LEFT UPPER ARM;  Surgeon: Serafina Mitchell, MD;  Location: [place];  Service: Vascular;  Laterality: Left;  ? ?Past Surgical History:  ?Procedure Laterality Date  ? AV FISTULA PLACEMENT  12/18/2011  ? Procedure: ARTERIOVENOUS (AV) FISTULA CREATION;  Surgeon: Angelia Mould, MD;  Location: [place];  Service: Vascular;  Laterality: Left;  ? INSERTION OF DIALYSIS CATHETER Right 08/06/2014  ? Procedure: INSERTION OF DIALYSIS CATHETER;  Surgeon: Elam Dutch, MD;  Location: [place];  Service: Vascular;  Laterality: Right;  ? RENAL BIOPSY    ? REVISON OF ARTERIOVENOUS FISTULA Left 08/06/2014  ? Procedure: PLICATION OF ARTERIOVENOUS FISTULA;  Surgeon: Elam Dutch, MD;  Location: [place];  Service: Vascular;  Laterality: Left;  ? REVISON OF ARTERIOVENOUS FISTULA Left 03/12/2017  ? Procedure: REVISION PLICATION OF ARTERIOVENOUS FISTULA LEFT UPPER ARM;   Surgeon: Serafina Mitchell, MD;  Location: [place];  Service: Vascular;  Laterality: Left;  ? ?Past Medical History:  ?Diagnosis Date  ? Anemia   ? Brain injury ([place])   ? Cardiac arrest ([place]) 03/26/2011  ? Cardiomyopathy   ? CHF (congestive heart failure) ([place])   ? Chronic kidney disease   ? TThS- [place]  ? Depression   ? Family history of adverse reaction to anesthesia   ? Mother hard to wake up.  ? Gait disorder 01/11/2013  ? Heart failure   ? History of blood transfusion 11/2011  ? Hypertension   ? Ileitis   ? MPGN (membranoproliferative glomerulonephritis), type 2   ? Pneumonia   ? 'walking pneumonia'  ? Renal disorder   ? Seizures ([place])   ? 2013- last time  ? ?BP 115/74   Pulse 82   Ht '5\' 2"'$  (1.575 m)   Wt 155 lb 6.4 oz (70.5 kg)   SpO2 98%   BMI 28.42 kg/m?  ? ?Opioid Risk Score:   ?Fall Risk Score:  `1 ? ?Depression screen PHQ 2/9 ? ? ?  07/23/2021  ?  9:26 AM 10/18/2017  ?  9:12 AM 07/02/2014  ? 12:33 PM  ?Depression screen PHQ 2/9  ?Decreased Interest 0 1 1  ?Down, Depressed, Hopeless 0 1 1  ?PHQ - 2 Score 0 2 2  ?Altered sleeping   1  ?Tired, decreased energy   1  ?Change in appetite   0  ?Feeling bad or failure about yourself    2  ?Trouble concentrating   0  ?Moving slowly or fidgety/restless   0  ?Suicidal thoughts   0  ?PHQ-9 Score   6  ?  ? ?Review of Systems  ?Constitutional: Negative.   ?HENT: Negative.    ?Eyes: Negative.   ?Respiratory: Negative.    ?Cardiovascular: Negative.   ?Gastrointestinal: Negative.   ?Endocrine: Negative.   ?Genitourinary: Negative.   ?Musculoskeletal: Negative.   ?Skin: Negative.   ?Allergic/Immunologic: Negative.   ?Neurological: Negative.   ?Hematological: Negative.   ?Psychiatric/Behavioral: Negative.    ? ?   ?Objective:  ? Physical Exam ? General: No acute distress ?HEENT: NCAT, EOMI, oral membranes moist ?Cards: reg rate  ?Chest: normal effort ?Abdomen: Soft, NT, ND ?Skin: dry, intact ?Extremities: no edema ?Psych: pleasant and appropriate  ?Musculoskeletal:  ? Skin:     ?   General: Skin is warm and dry.  ?Neurological:  ?   Mental Status: She is alert and oriented to person, place, and time.very engaging. Improved insight and awareness. Gait improved. Still No clonus today. Balance wexcellent. Strength 5/5   ?  ?  ?  ?  ?  ?  ?  ?  ?  Assessment & Plan:  ?1. 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 ?2. Myoclonus: Continue Klonopin 0.5 mg BID.   ?            -decrease to 0.'25mg'$  bid after keppra weaned off ?Controlled Substance Reporting system. ?3. Gait Disorder: RX:  : Continue walking with walker   ?4. Seizure Disorder: Neurology Following. Continue to Monitor.  ?5. ESRD: S/P Transplant: Nephrology Following.  ?6. Attention deficits, memory dysfunction ?            -hold ritalin for now. It's a quality life scenario for her ?            -may use coffee to help ?            -discussed working on one thing at a time, avoid mutli-tasking ?            -adequate sleep is important ? -weaning keppra and klonopin should help as well ?  ?30  minutes of face to face patient care time was spent during this visit. All questions were encouraged and answered. f/u in a year.  ? ? ? ?  ? ? ?

## 2021-07-23 NOTE — Unmapped (Signed)
Refill responded to via other means

## 2021-07-23 NOTE — Unmapped (Signed)
Refill per standing orders

## 2021-07-24 LAB — TACROLIMUS LEVEL: TACROLIMUS BLOOD: 5.5 ng/mL (ref 2.0–20.0)

## 2021-07-28 DIAGNOSIS — Z79899 Other long term (current) drug therapy: Principal | ICD-10-CM

## 2021-07-28 DIAGNOSIS — Z1159 Encounter for screening for other viral diseases: Principal | ICD-10-CM

## 2021-07-28 DIAGNOSIS — Z940 Kidney transplant status: Principal | ICD-10-CM

## 2021-07-28 NOTE — Unmapped (Signed)
Patient called and states she is planning on going to [place] in June or July.    She will ask pharmacy to send an extra month of meds    Will message Dr Margaretmary Bayley to see if she needs any additional medicine to take with her.

## 2021-07-28 NOTE — Unmapped (Signed)
[facility] Refill Coordination Note    Specialty Medication(s) to be Shipped:   Transplant: Envarsus 1mg , Envarsus 4mg ,  mycophenolic acid 180mg  and Prednisone 5mg     Other medication(s) to be shipped:    Mag  Sodium bicarb     Carolyn Kelley, DOB: [birth year]  Phone: [phone] (home)       All above HIPAA information was verified with patient.     Was a Nurse, learning disability used for this call? No    Completed refill call assessment today to schedule patient's medication shipment from the [facility] [phone]).  All relevant notes have been reviewed.     Specialty medication(s) and dose(s) confirmed: Regimen is correct and unchanged.   Changes to medications: Elmarie reports no changes at this time.  Changes to insurance: No  New side effects reported not previously addressed with a pharmacist or physician: None reported  Questions for the pharmacist: No    Confirmed patient received a Conservation officer, historic buildings and a Surveyor, mining with first shipment. The patient will receive a drug information handout for each medication shipped and additional FDA Medication Guides as required.       DISEASE/MEDICATION-SPECIFIC INFORMATION        N/A    SPECIALTY MEDICATION ADHERENCE     Medication Adherence    Patient reported X missed doses in the last month: 0  Specialty Medication: ENVARSUS XR 4 mg Tb24 extended release tablet (tacrolimus)  Patient is on additional specialty medications: Yes  Additional Specialty Medications: ENVARSUS XR 1 mg Tb24 extended release tablet (tacrolimus)  Patient Reported Additional Medication X Missed Doses in the Last Month: 0  Patient is on more than two specialty medications: Yes  Specialty Medication: mycophenolate 180 MG EC tablet (MYFORTIC)  Patient Reported Additional Medication X Missed Doses in the Last Month: 0       Envarsus 1mg  : 12 days of medicine on hand   Envarsus 4mg  : 12 days of medicine on hand   Mycophenolate 180mg  : 12 days of medicine on hand   Prednisone 5mg  : 12 days of medicine on hand     Were doses missed due to medication being on hold? No    REFERRAL TO PHARMACIST     Referral to the pharmacist: Not needed      [facility]     Shipping address confirmed in Epic.     Delivery Scheduled: Yes, Expected medication delivery date: 08/05/21.     Medication will be delivered via UPS to the prescription address in Epic WAM.    [place] A Raif Chachere   [facility] Specialty Technician

## 2021-08-05 MED FILL — PREDNISONE 5 MG TABLET: ORAL | 30 days supply | Qty: 30 | Fill #7

## 2021-08-05 MED FILL — ENVARSUS XR 4 MG TABLET,EXTENDED RELEASE: ORAL | 30 days supply | Qty: 30 | Fill #5

## 2021-08-05 MED FILL — SODIUM BICARBONATE 650 MG TABLET: ORAL | 60 days supply | Qty: 120 | Fill #3

## 2021-08-05 MED FILL — ENVARSUS XR 1 MG TABLET,EXTENDED RELEASE: ORAL | 30 days supply | Qty: 90 | Fill #6

## 2021-08-05 MED FILL — MYCOPHENOLATE SODIUM 180 MG TABLET,DELAYED RELEASE: ORAL | 30 days supply | Qty: 240 | Fill #8

## 2021-08-05 MED FILL — MG-PLUS-PROTEIN 133 MG TABLET: ORAL | 30 days supply | Qty: 120 | Fill #5

## 2021-08-12 NOTE — Unmapped (Addendum)
Per team recommendations patient should get prior to going to [place]:  Hep A and the typhoid vaccines.   If she is traveling in <2 weeks, she should get the HepA vaccine and simultaneous IG. Otherwise, if > 2 weeks until she travels, the vaccine alone should be fine     I would also consider giving her azithromycin 1 gm in a single dose or 500mg  po once daily for 3 days to take in case if she develops moderate to severe traveler's diarrhea        Left patient Vm to call back about vaccines    6.6.23: Patient called back and states she tried to get the vaccines at her PCP, health department and local pharmacy and has a high out of pocket expense.  $300 for one and 289 for the other, she cannot afford to get.    Instructed patient to:  Drink bottled water, avoid ice and drinks mixed with water ??   Avoid fruits and salads (uncooked foods)   Wear masks in public if possible, esp on the plane   Use antibacterial sanitizers or wash hands often, esp before eating   Take imodium just in case she gets a diarrheal illness, which she can try before taking azithro for severe - moderate illness   ????       Will update Dr Margaretmary Bayley and team

## 2021-08-19 DIAGNOSIS — Z940 Kidney transplant status: Principal | ICD-10-CM

## 2021-08-25 ENCOUNTER — Other Ambulatory Visit: Payer: Self-pay | Admitting: Neurology

## 2021-08-25 DIAGNOSIS — Z940 Kidney transplant status: Principal | ICD-10-CM

## 2021-08-25 DIAGNOSIS — Z1159 Encounter for screening for other viral diseases: Principal | ICD-10-CM

## 2021-08-25 DIAGNOSIS — Z79899 Other long term (current) drug therapy: Principal | ICD-10-CM

## 2021-08-25 MED ORDER — CHOLECALCIFEROL (VITAMIN D3) 125 MCG (5,000 UNIT) CAPSULE
ORAL_CAPSULE | Freq: Every day | ORAL | 11 refills | 100 days | Status: CP
Start: 2021-08-25 — End: ?
  Filled 2021-08-28: qty 100, 100d supply, fill #0

## 2021-08-25 MED ORDER — LEVETIRACETAM 250 MG TABLET
ORAL_TABLET | ORAL | 3 refills | 0 days
Start: 2021-08-25 — End: ?

## 2021-08-25 NOTE — Unmapped (Signed)
6/5: patient is going out of country and needs vacation overrides. Note added in wam of destination and dates gone to request vacation override for patient. Note also added to call patient if ANY issues on Wed with getting rxs sent in or vacation overrides obtained so we can explore discount/trial cards or transferring rxs out if needed. Pt is aware and may also call on Wed to check status -ef      [facility] Clinical Assessment & Refill Coordination Note    Carolyn Kelley, DOB: [birth year]  Phone: [phone] (home)     All above HIPAA information was verified with patient.     Was a Nurse, learning disability used for this call? No    Specialty Medication(s):   Transplant: Envarsus 1mg , Envarsus 4mg ,  mycophenolic acid 180mg  and Prednisone 5mg      Current Outpatient Medications   Medication Sig Dispense Refill   ??? acetaminophen (TYLENOL) 500 MG tablet Take 1-2 tablets (500-1,000 mg total) by mouth every six (6) hours as needed for pain or fever (> 38C). 100 tablet 0   ??? cholecalciferol, vitamin D3-125 mcg, 5,000 unit,, 125 mcg (5,000 unit) capsule Take 1 capsule (125 mcg total) by mouth daily. 100 capsule 11   ??? clonazePAM (KLONOPIN) 0.5 MG tablet Take 1 tablet (0.5 mg total) by mouth two (2) times a day. 60 tablet 3   ??? levETIRAcetam (KEPPRA) 250 MG tablet TAKE 1 TABLET BY MOUTH TWICE DAILY. 180 tablet 3   ??? magnesium oxide-Mg AA chelate (MAGNESIUM, AMINO ACID CHELATE,) 133 mg Take 2 tablets by mouth Two (2) times a day. 120 tablet 11   ??? mycophenolate (MYFORTIC) 180 MG EC tablet Take 4 tablets (720 mg total) by mouth Two (2) times a day. Adjust dose per medication card. 240 tablet 11   ??? predniSONE (DELTASONE) 5 MG tablet Take 1 tablet (5 mg total) by mouth daily. 30 tablet 11   ??? sodium bicarbonate 650 mg tablet Take 1 tablet (650 mg total) by mouth Two (2) times a day. 120 tablet 11   ??? tacrolimus (ENVARSUS XR) 1 mg Tb24 extended release tablet Take three (1mg ) tablets with one (4mg ) tablet by mouth daily for a total daily dose of 7 mg. 90 tablet 11   ??? tacrolimus (ENVARSUS XR) 4 mg Tb24 extended release tablet Take one (4mg ) tablet with three (1mg ) tablets by mouth daily for a total daily dose of 7 mg. 30 tablet 11   ??? tretinoin (RETIN-A) 0.025 % cream Start every other night and increase to nightly as tolerated 45 g 3   ??? tretinoin (RETIN-A) 0.025 % cream Apply 1 application. topically nightly. Nightly as tolerated 45 g 3     No current facility-administered medications for this visit.        Changes to medications: [name] no changes at this time.    Allergies   Allergen Reactions   ??? Bee Pollen Swelling     Sneezing, congestion  denies swelling.  Sneezing, congestion  denies swelling.     ??? Pollen Extracts Swelling       Changes to allergies: No    SPECIALTY MEDICATION ADHERENCE     envarsus 1mg   : 10 days of medicine on hand   envarsus 4mg   : 10 days of medicine on hand   Mycophenolate 180mg  :  10days of medicine on hand   Prednisone 5mg   : 10 days of medicine on hand       Medication Adherence  Patient reported X missed doses in the last month: 0  Specialty Medication: envarsus 1mg   Patient is on additional specialty medications: Yes  Additional Specialty Medications: Envarsus 4mg   Patient Reported Additional Medication X Missed Doses in the Last Month: 0  Patient is on more than two specialty medications: Yes  Specialty Medication: mycophenolate 180mg   Patient Reported Additional Medication X Missed Doses in the Last Month: 0  Specialty Medication: prednisone 5mg   Patient Reported Additional Medication X Missed Doses in the Last Month: 0          Specialty medication(s) dose(s) confirmed: Regimen is correct and unchanged.     Are there any concerns with adherence? No    Adherence counseling provided? Not needed    CLINICAL MANAGEMENT AND INTERVENTION      Clinical Benefit Assessment:    Do you feel the medicine is effective or helping your condition? Yes    Clinical Benefit counseling provided? Not needed    Adverse Effects Assessment:    Are you experiencing any side effects? No    Are you experiencing difficulty administering your medicine? No    Quality of Life Assessment:         How many days over the past month did your transplant  keep you from your normal activities? For example, brushing your teeth or getting up in the morning. 0    Have you discussed this with your provider? Not needed    Acute Infection Status:    Acute infections noted within Epic:  No active infections  Patient reported infection: None    Therapy Appropriateness:    Is therapy appropriate and patient progressing towards therapeutic goals? Yes, therapy is appropriate and should be continued    DISEASE/MEDICATION-SPECIFIC INFORMATION      N/A    PATIENT SPECIFIC NEEDS     - Does the patient have any physical, cognitive, or cultural barriers? No    - Is the patient high risk? Yes, patient is taking a REMS drug. Medication is dispensed in compliance with REMS program    - Does the patient require a Care Management Plan? No     SOCIAL DETERMINANTS OF HEALTH     At the [facility], we have learned that life circumstances - like trouble affording food, housing, utilities, or transportation can affect the health of many of our patients.   That is why we wanted to ask: are you currently experiencing any life circumstances that are negatively impacting your health and/or quality of life? No    Social Determinants of Psychologist, prison and probation services Strain: Not on file   Internet Connectivity: Not on file   Food Insecurity: Not on file   Tobacco Use: Low Risk    ??? Smoking Tobacco Use: Never   ??? Smokeless Tobacco Use: Never   ??? Passive Exposure: Not on file   Housing/Utilities: Not on file   Alcohol Use: Not on file   Transportation Needs: Not on file   Substance Use: Not on file   Health Literacy: Not on file   Physical Activity: Not on file   Interpersonal Safety: Not on file   Stress: Not on file   Intimate Partner Violence: Not on file   Depression: Not on file   Social Connections: Not on file       Would you be willing to receive help with any of the needs that you have identified today? Not applicable       SHIPPING  Specialty Medication(s) to be Shipped:   Transplant: Envarsus 1mg , Envarsus 4mg ,  mycophenolic acid 180mg  and Prednisone 5mg     Other medication(s) to be shipped: mgplus, vitamin d, keppra     Changes to insurance: No    Delivery Scheduled: Yes, Expected medication delivery date: 08/28/2021.  However, Rx request for refills was sent to the provider as there are none remaining.     Medication will be delivered via UPS next day air to the confirmed prescription address in [facility]    The patient will receive a drug information handout for each medication shipped and additional FDA Medication Guides as required.  Verified that patient has previously received a Conservation officer, historic buildings and a Surveyor, mining.    The patient or caregiver noted above participated in the development of this care plan and knows that they can request review of or adjustments to the care plan at any time.      All of the patient's questions and concerns have been addressed.    Thad Ranger   [facility] Specialty Pharmacist

## 2021-08-26 LAB — CBC W/ DIFFERENTIAL
BANDED NEUTROPHILS ABSOLUTE COUNT: 0.1 10*3/uL (ref 0.0–0.1)
BASOPHILS ABSOLUTE COUNT: 0 10*3/uL (ref 0.0–0.2)
BASOPHILS RELATIVE PERCENT: 1 %
EOSINOPHILS ABSOLUTE COUNT: 0.1 10*3/uL (ref 0.0–0.4)
EOSINOPHILS RELATIVE PERCENT: 3 %
HEMATOCRIT: 34.6 % (ref 34.0–46.6)
HEMOGLOBIN: 11.1 g/dL (ref 11.1–15.9)
IMMATURE GRANULOCYTES: 3 %
LYMPHOCYTES ABSOLUTE COUNT: 1.1 10*3/uL (ref 0.7–3.1)
LYMPHOCYTES RELATIVE PERCENT: 32 %
MEAN CORPUSCULAR HEMOGLOBIN CONC: 32.1 g/dL (ref 31.5–35.7)
MEAN CORPUSCULAR HEMOGLOBIN: 29.9 pg (ref 26.6–33.0)
MEAN CORPUSCULAR VOLUME: 93 fL (ref 79–97)
MONOCYTES ABSOLUTE COUNT: 0.5 10*3/uL (ref 0.1–0.9)
MONOCYTES RELATIVE PERCENT: 15 %
NEUTROPHILS ABSOLUTE COUNT: 1.5 10*3/uL (ref 1.4–7.0)
NEUTROPHILS RELATIVE PERCENT: 46 %
PLATELET COUNT: 207 10*3/uL (ref 150–450)
RED BLOOD CELL COUNT: 3.71 x10E6/uL — ABNORMAL LOW (ref 3.77–5.28)
RED CELL DISTRIBUTION WIDTH: 12.8 % (ref 11.7–15.4)
WHITE BLOOD CELL COUNT: 3.3 10*3/uL — ABNORMAL LOW (ref 3.4–10.8)

## 2021-08-26 MED ORDER — AZITHROMYCIN 500 MG TABLET
ORAL_TABLET | Freq: Every day | ORAL | 0 refills | 3 days | Status: CP
Start: 2021-08-26 — End: 2021-08-29

## 2021-08-27 LAB — BASIC METABOLIC PANEL
BLOOD UREA NITROGEN: 29 mg/dL — ABNORMAL HIGH (ref 6–20)
BUN / CREAT RATIO: 27 — ABNORMAL HIGH (ref 9–23)
CALCIUM: 10 mg/dL (ref 8.7–10.2)
CHLORIDE: 107 mmol/L — ABNORMAL HIGH (ref 96–106)
CO2: 20 mmol/L (ref 20–29)
CREATININE: 1.09 mg/dL — ABNORMAL HIGH (ref 0.57–1.00)
EGFR: 69 mL/min/{1.73_m2}
GLUCOSE: 98 mg/dL (ref 70–99)
POTASSIUM: 5 mmol/L (ref 3.5–5.2)
SODIUM: 138 mmol/L (ref 134–144)

## 2021-08-27 LAB — MAGNESIUM: MAGNESIUM: 1.7 mg/dL (ref 1.6–2.3)

## 2021-08-27 LAB — PHOSPHORUS: PHOSPHORUS, SERUM: 3.3 mg/dL (ref 3.0–4.3)

## 2021-08-27 NOTE — Unmapped (Signed)
Carolyn Kelley 's entire shipment will be delayed as a result of the medication is too soon to refill until 08/28/2021.     I have reached out to the patient  at [phone] and communicated the delivery change. We will reschedule the medication for the delivery date that the patient agreed upon.  We have confirmed the delivery date as 08/29/2021, via ups.

## 2021-08-28 LAB — TACROLIMUS LEVEL: TACROLIMUS BLOOD: 10.2 ng/mL (ref 2.0–20.0)

## 2021-08-28 MED FILL — MYCOPHENOLATE SODIUM 180 MG TABLET,DELAYED RELEASE: ORAL | 30 days supply | Qty: 240 | Fill #9

## 2021-08-28 MED FILL — ENVARSUS XR 1 MG TABLET,EXTENDED RELEASE: ORAL | 30 days supply | Qty: 90 | Fill #7

## 2021-08-28 MED FILL — PREDNISONE 5 MG TABLET: ORAL | 30 days supply | Qty: 30 | Fill #8

## 2021-08-28 MED FILL — MG-PLUS-PROTEIN 133 MG TABLET: ORAL | 30 days supply | Qty: 120 | Fill #6

## 2021-08-28 MED FILL — ENVARSUS XR 4 MG TABLET,EXTENDED RELEASE: ORAL | 30 days supply | Qty: 30 | Fill #6

## 2021-09-18 NOTE — Unmapped (Signed)
[facility] Refill Coordination Note    Specialty Medication(s) to be Shipped:   Transplant: Envarsus 1mg , Envarsus 4mg , mycophenolate mofetil 180mg , and Prednisone 5mg     Other medication(s) to be shipped:  mag-ox      Carolyn Kelley, DOB: 03-05-1991  Phone: [phone] (home)       All above HIPAA information was verified with patient.     Was a Nurse, learning disability used for this call? No    Completed refill call assessment today to schedule patient's medication shipment from the [facility] [phone]).  All relevant notes have been reviewed.     Specialty medication(s) and dose(s) confirmed: Regimen is correct and unchanged.   Changes to medications: Michele reports no changes at this time.  Changes to insurance: No  New side effects reported not previously addressed with a pharmacist or physician: None reported  Questions for the pharmacist: No    Confirmed patient received a Conservation officer, historic buildings and a Surveyor, mining with first shipment. The patient will receive a drug information handout for each medication shipped and additional FDA Medication Guides as required.       DISEASE/MEDICATION-SPECIFIC INFORMATION        N/A    SPECIALTY MEDICATION ADHERENCE     Medication Adherence    Patient reported X missed doses in the last month: 0  Specialty Medication: ENVARSUS XR 4 mg Tb24 extended release tablet (tacrolimus)  Patient is on additional specialty medications: Yes  Additional Specialty Medications: ENVARSUS XR 1 mg Tb24 extended release tablet (tacrolimus)  Patient Reported Additional Medication X Missed Doses in the Last Month: 0  Patient is on more than two specialty medications: Yes  Specialty Medication: mycophenolate 180 MG EC tablet (MYFORTIC)  Patient Reported Additional Medication X Missed Doses in the Last Month: 0  Specialty Medication: predniSONE 5 MG tablet (DELTASONE)  Patient Reported Additional Medication X Missed Doses in the Last Month: 0              Were doses missed due to medication being on hold? No    Envarsus 1 mg: 10 days of medicine on hand   Envarsus 4 mg: 10 days of medicine on hand   Mycophenolate  180 mg: 10 days of medicine on hand   prednisone 5 mg: 10 days of medicine on hand       REFERRAL TO PHARMACIST     Referral to the pharmacist: Not needed      SHIPPING     Shipping address confirmed in Epic.     Delivery Scheduled: Yes, Expected medication delivery date: 09/26/21.     Medication will be delivered via UPS to the prescription address in Epic WAM.    Carolyn Kelley   [facility] Specialty Technician

## 2021-09-20 NOTE — Unmapped (Signed)
Reason for Disposition  ??? [1] Fever > 100.0 F (37.8 C) AND [2] bedridden (e.g., CVA, chronic illness, recovering from surgery)    Answer Assessment - Initial Assessment Questions  1. ONSET: When did the muscle aches or body pains start?       09/19/2021  2. LOCATION: What part of your body is hurting? (e.g., entire body, arms, legs)       Legs, feet, thighs, back   3. SEVERITY: How bad is the pain? (Scale 1-10; or mild, moderate, severe)    - MILD (1-3): doesn't interfere with normal activities     - MODERATE (4-7): interferes with normal activities or awakens from sleep     - SEVERE (8-10):  excruciating pain, unable to do any normal activities       8/10  4. CAUSE: What do you think is causing the pains?       I think a flu I caught on the plane and also I walked a lot   5. FEVER: Have you been having fever?      Had a temp yesterday, she thinks 100.9 .  She is hot today and not sure of her temp.....  6. OTHER SYMPTOMS: Do you have any other symptoms? (e.g., chest pain, weakness, rash, cold or flu symptoms, weight loss)      Had 2 two loose stools on 09/19/2021 but none today,  chills on 09/19/2021  7. PREGNANCY: Is there any chance you are pregnant? When was your last menstrual period?      no  8. TRAVEL: Have you traveled out of the country in the last month? (e.g., travel history, exposures)      I went to [place], returned on 09/18/2021    Protocols used: Muscle Aches and Body Pain-A-AH

## 2021-09-20 NOTE — Unmapped (Addendum)
Upcoming Appt:  Future Appointments   Date Time Provider Department Center   09/26/2021  8:00 AM Randal Ewing Schlein, MD [facility]       Recent:   What is the date of your last related visit?  N/a  Related acute medications Rx'd:  n/a  Home treatment tried:  Tylenol @ 2300      Relevant:   Allergies: Bee pollen and Pollen extracts  Medications: prednisone   Health History: Kidney transplant  Weight: n/a      [facility] [place] patients only:  What was the date of your last cancer treatment (mm/dd/yy)?: n/a  Was the treatment oral or infusion?: n/a  Are you currently on TVEC (yes/no)?: n/a      Regarding: Fever; immunocompromised; Transplant patient  ----- Message from Lysbeth Penner sent at 09/20/2021  8:02 AM EDT -----  If you had not called [facility], what do you think you would have done?   Home Care

## 2021-09-22 DIAGNOSIS — Z1159 Encounter for screening for other viral diseases: Principal | ICD-10-CM

## 2021-09-22 DIAGNOSIS — Z79899 Other long term (current) drug therapy: Principal | ICD-10-CM

## 2021-09-22 DIAGNOSIS — Z940 Kidney transplant status: Principal | ICD-10-CM

## 2021-09-24 DIAGNOSIS — Z940 Kidney transplant status: Principal | ICD-10-CM

## 2021-09-25 NOTE — Unmapped (Signed)
Patient left voicemail and requested call back. Called patient back.   She reports feeling poorly.  She says that she had a fever over the weekend after she had come home from traveling. Patient reports the fever has resolved and she has been afebrile x3 days.  Despite this she still feels very lethargic and has had a very low appetite. Patient reports calling this Clinical research associate as she is worried that she is not eating enough as all she wants to eat are starches and ice cream. Discussed with patient to not worry so much about her low appetite as it is likely related to why she feels so poorly. Also encouraged patient to try to stay hydrated even when not feeling well. Asked patient if this writer could inform [facility] of her symptoms and she was agreeable. Patient reports plan to see MD for appointment tomorrow and encouraged her to also inform him of these symptoms.     Communicated the above to [facility].     Lanelle Bal, RD, LDN, CCTD  Abdominal Transplant Dietitian   Pager: [phone]

## 2021-09-26 ENCOUNTER — Ambulatory Visit: Admit: 2021-09-26 | Discharge: 2021-09-30 | Payer: PRIVATE HEALTH INSURANCE

## 2021-09-26 ENCOUNTER — Ambulatory Visit
Admit: 2021-09-26 | Discharge: 2021-09-30 | Disposition: A | Discharge status: Home Health | Payer: PRIVATE HEALTH INSURANCE | Source: Ambulatory Visit

## 2021-09-26 ENCOUNTER — Ambulatory Visit
Admit: 2021-09-26 | Discharge: 2021-09-30 | Payer: PRIVATE HEALTH INSURANCE | Attending: Nephrology | Primary: Nephrology

## 2021-09-26 DIAGNOSIS — D849 Immunodeficiency, unspecified: Principal | ICD-10-CM

## 2021-09-26 DIAGNOSIS — N056 Unspecified nephritic syndrome with dense deposit disease: Principal | ICD-10-CM

## 2021-09-26 DIAGNOSIS — I10 Essential (primary) hypertension: Principal | ICD-10-CM

## 2021-09-26 DIAGNOSIS — Z940 Kidney transplant status: Principal | ICD-10-CM

## 2021-09-26 LAB — URINALYSIS WITH MICROSCOPY
BILIRUBIN UA: NEGATIVE
BILIRUBIN UA: NEGATIVE
GLUCOSE UA: NEGATIVE
GLUCOSE UA: NEGATIVE
HYALINE CASTS: 7 /LPF — ABNORMAL HIGH (ref 0–1)
KETONES UA: NEGATIVE
KETONES UA: NEGATIVE
NITRITE UA: NEGATIVE
NITRITE UA: NEGATIVE
PH UA: 5 (ref 5.0–9.0)
PH UA: 5.5 (ref 5.0–9.0)
PROTEIN UA: 100 — AB
PROTEIN UA: 30 — AB
RBC UA: 100 /HPF — ABNORMAL HIGH (ref <4)
RBC UA: 3 /HPF (ref <=4)
SPECIFIC GRAVITY UA: 1.025 (ref 1.005–1.030)
SPECIFIC GRAVITY UA: 1.009 (ref 1.003–1.030)
SQUAMOUS EPITHELIAL: 50 /HPF — ABNORMAL HIGH (ref 0–5)
SQUAMOUS EPITHELIAL: 5 /HPF (ref 0–5)
UROBILINOGEN UA: 0.2
UROBILINOGEN UA: <2
WBC UA: 15 /HPF — ABNORMAL HIGH (ref 0–5)
WBC UA: 11 /HPF — ABNORMAL HIGH (ref 0–5)

## 2021-09-26 LAB — HEMOGLOBIN A1C
ESTIMATED AVERAGE GLUCOSE: 103 mg/dL
HEMOGLOBIN A1C: 5.2 % (ref 4.8–5.6)

## 2021-09-26 LAB — HEPATIC FUNCTION PANEL
ALBUMIN: 3.4 g/dL (ref 3.4–5.0)
ALKALINE PHOSPHATASE: 76 U/L (ref 46–116)
ALT (SGPT): 95 U/L — ABNORMAL HIGH (ref 10–49)
AST (SGOT): 34 U/L (ref <=34)
BILIRUBIN DIRECT: 0.3 mg/dL (ref 0.00–0.30)
BILIRUBIN TOTAL: 0.5 mg/dL (ref 0.3–1.2)
PROTEIN TOTAL: 7.5 g/dL (ref 5.7–8.2)

## 2021-09-26 LAB — URINALYSIS WITH MICROSCOPY WITH CULTURE REFLEX
BILIRUBIN UA: NEGATIVE
GLUCOSE UA: NEGATIVE
HYALINE CASTS: 4 /LPF — ABNORMAL HIGH (ref 0–1)
KETONES UA: NEGATIVE
NITRITE UA: NEGATIVE
PH UA: 5 (ref 5.0–9.0)
PROTEIN UA: 50 — AB
RBC UA: 55 /HPF — ABNORMAL HIGH (ref <=4)
SPECIFIC GRAVITY UA: 1.012 (ref 1.003–1.030)
SQUAMOUS EPITHELIAL: 21 /HPF — ABNORMAL HIGH (ref 0–5)
UROBILINOGEN UA: <2
WBC UA: 51 /HPF — ABNORMAL HIGH (ref 0–5)

## 2021-09-26 LAB — MANUAL DIFFERENTIAL
BASOPHILS - ABS (DIFF): 0 10*9/L (ref 0.0–0.1)
BASOPHILS - ABS (DIFF): 0.1 10*9/L (ref 0.0–0.1)
BASOPHILS - REL (DIFF): 2 %
BASOPHILS - REL (DIFF): 3 %
EOSINOPHILS - ABS (DIFF): 0.1 10*9/L (ref 0.0–0.5)
EOSINOPHILS - ABS (DIFF): 0.2 10*9/L (ref 0.0–0.5)
EOSINOPHILS - REL (DIFF): 5 %
EOSINOPHILS - REL (DIFF): 4 %
LYMPHOCYTES - ABS (DIFF): 0.9 10*9/L — ABNORMAL LOW (ref 1.1–3.6)
LYMPHOCYTES - ABS (DIFF): 1.4 10*9/L (ref 1.1–3.6)
LYMPHOCYTES - REL (DIFF): 39 %
LYMPHOCYTES - REL (DIFF): 32 %
MONOCYTES - ABS (DIFF): 0.5 10*9/L (ref 0.3–0.8)
MONOCYTES - ABS (DIFF): 0.7 10*9/L (ref 0.3–0.8)
MONOCYTES - REL (DIFF): 19 %
MONOCYTES - REL (DIFF): 17 %
NEUTROPHILS - ABS (DIFF): 0.8 10*9/L — ABNORMAL LOW (ref 1.8–7.8)
NEUTROPHILS - ABS (DIFF): 1.9 10*9/L (ref 1.8–7.8)
NEUTROPHILS - REL (DIFF): 35 %
NEUTROPHILS - REL (DIFF): 44 %

## 2021-09-26 LAB — RENAL FUNCTION PANEL
ALBUMIN: 2.9 g/dL — ABNORMAL LOW (ref 3.4–5.0)
ANION GAP: 16 mmol/L — ABNORMAL HIGH (ref 5–14)
BLOOD UREA NITROGEN: 96 mg/dL — ABNORMAL HIGH (ref 9–23)
BUN / CREAT RATIO: 20
CALCIUM: 9.3 mg/dL (ref 8.7–10.4)
CHLORIDE: 100 mmol/L (ref 98–107)
CO2: 15 mmol/L — ABNORMAL LOW (ref 20.0–31.0)
CREATININE: 4.78 mg/dL — ABNORMAL HIGH
EGFR CKD-EPI (2021) FEMALE: 12 mL/min/{1.73_m2} — ABNORMAL LOW (ref >=60)
GLUCOSE RANDOM: 108 mg/dL (ref 70–179)
PHOSPHORUS: 5.5 mg/dL — ABNORMAL HIGH (ref 2.4–5.1)
POTASSIUM: 3.8 mmol/L (ref 3.4–4.8)
SODIUM: 131 mmol/L — ABNORMAL LOW (ref 135–145)

## 2021-09-26 LAB — ALBUMIN / CREATININE URINE RATIO
ALBUMIN QUANT URINE: 24.9 mg/dL
ALBUMIN/CREATININE RATIO: 85.3 ug/mg — ABNORMAL HIGH (ref 0.0–30.0)
CREATININE, URINE: 291.9 mg/dL

## 2021-09-26 LAB — CBC W/ AUTO DIFF
HEMATOCRIT: 31.5 % — ABNORMAL LOW (ref 34.0–44.0)
HEMATOCRIT: 27.3 % — ABNORMAL LOW (ref 34.0–44.0)
HEMOGLOBIN: 10.4 g/dL — ABNORMAL LOW (ref 11.3–14.9)
HEMOGLOBIN: 9.4 g/dL — ABNORMAL LOW (ref 11.3–14.9)
MEAN CORPUSCULAR HEMOGLOBIN CONC: 32.9 g/dL (ref 32.0–36.0)
MEAN CORPUSCULAR HEMOGLOBIN CONC: 34.6 g/dL (ref 32.0–36.0)
MEAN CORPUSCULAR HEMOGLOBIN: 29.7 pg (ref 25.9–32.4)
MEAN CORPUSCULAR HEMOGLOBIN: 30.9 pg (ref 25.9–32.4)
MEAN CORPUSCULAR VOLUME: 90.2 fL (ref 77.6–95.7)
MEAN CORPUSCULAR VOLUME: 89.1 fL (ref 77.6–95.7)
MEAN PLATELET VOLUME: 8.3 fL (ref 6.8–10.7)
PLATELET COUNT: 283 10*9/L (ref 150–450)
PLATELET COUNT: >258 10*9/L (ref 150–450)
RED BLOOD CELL COUNT: 3.49 10*12/L — ABNORMAL LOW (ref 3.95–5.13)
RED BLOOD CELL COUNT: 3.06 10*12/L — ABNORMAL LOW (ref 3.95–5.13)
RED CELL DISTRIBUTION WIDTH: 14.1 % (ref 12.2–15.2)
RED CELL DISTRIBUTION WIDTH: 13.8 % (ref 12.2–15.2)
WBC ADJUSTED: 2.4 10*9/L — ABNORMAL LOW (ref 3.6–11.2)
WBC ADJUSTED: 4.3 10*9/L (ref 3.6–11.2)

## 2021-09-26 LAB — BLOOD GAS CRITICAL CARE PANEL, VENOUS
BASE EXCESS VENOUS: -8.7 — ABNORMAL LOW (ref -2.0–2.0)
CALCIUM IONIZED VENOUS (MG/DL): 4.98 mg/dL (ref 4.40–5.40)
GLUCOSE WHOLE BLOOD: 93 mg/dL (ref 70–179)
HCO3 VENOUS: 17 mmol/L — ABNORMAL LOW (ref 22–27)
HEMOGLOBIN BLOOD GAS: 11.4 g/dL — ABNORMAL LOW (ref 12.00–16.00)
LACTATE BLOOD VENOUS: 0.8 mmol/L (ref 0.5–1.8)
O2 SATURATION VENOUS: 53.3 % (ref 40.0–85.0)
PCO2 VENOUS: 36 mmHg — ABNORMAL LOW (ref 40–60)
PH VENOUS: 7.29 — ABNORMAL LOW (ref 7.32–7.43)
PO2 VENOUS: 33 mmHg (ref 30–55)
POTASSIUM WHOLE BLOOD: 3.9 mmol/L (ref 3.4–4.6)
SODIUM WHOLE BLOOD: 130 mmol/L — ABNORMAL LOW (ref 135–145)

## 2021-09-26 LAB — BASIC METABOLIC PANEL
ANION GAP: 16 mmol/L — ABNORMAL HIGH (ref 5–14)
BLOOD UREA NITROGEN: 101 mg/dL — ABNORMAL HIGH (ref 9–23)
BUN / CREAT RATIO: 18
CALCIUM: 10.1 mg/dL (ref 8.7–10.4)
CHLORIDE: 96 mmol/L — ABNORMAL LOW (ref 98–107)
CO2: 15.7 mmol/L — ABNORMAL LOW (ref 20.0–31.0)
CREATININE: 5.48 mg/dL — ABNORMAL HIGH
EGFR CKD-EPI (2021) FEMALE: 10 mL/min/{1.73_m2} — ABNORMAL LOW (ref >=60)
GLUCOSE RANDOM: 99 mg/dL (ref 70–99)
POTASSIUM: 3.8 mmol/L (ref 3.4–4.8)
SODIUM: 128 mmol/L — ABNORMAL LOW (ref 135–145)

## 2021-09-26 LAB — COMPREHENSIVE METABOLIC PANEL
ALBUMIN: 3.3 g/dL — ABNORMAL LOW (ref 3.4–5.0)
ALKALINE PHOSPHATASE: 73 U/L (ref 46–116)
ALT (SGPT): 87 U/L — ABNORMAL HIGH (ref 10–49)
ANION GAP: 15 mmol/L — ABNORMAL HIGH (ref 5–14)
AST (SGOT): 33 U/L (ref <=34)
BILIRUBIN TOTAL: 0.5 mg/dL (ref 0.3–1.2)
BLOOD UREA NITROGEN: 104 mg/dL — ABNORMAL HIGH (ref 9–23)
BUN / CREAT RATIO: 19
CALCIUM: 9.7 mg/dL (ref 8.7–10.4)
CHLORIDE: 96 mmol/L — ABNORMAL LOW (ref 98–107)
CO2: 16 mmol/L — ABNORMAL LOW (ref 20.0–31.0)
CREATININE: 5.54 mg/dL — ABNORMAL HIGH
EGFR CKD-EPI (2021) FEMALE: 10 mL/min/{1.73_m2} — ABNORMAL LOW (ref >=60)
GLUCOSE RANDOM: 92 mg/dL (ref 70–179)
POTASSIUM: 3.8 mmol/L (ref 3.4–4.8)
PROTEIN TOTAL: 7.3 g/dL (ref 5.7–8.2)
SODIUM: 127 mmol/L — ABNORMAL LOW (ref 135–145)

## 2021-09-26 LAB — C3 COMPLEMENT: C3 COMPLEMENT: 127 mg/dL (ref 90–170)

## 2021-09-26 LAB — MAGNESIUM
MAGNESIUM: 2.8 mg/dL — ABNORMAL HIGH (ref 1.6–2.6)
MAGNESIUM: 2.6 mg/dL (ref 1.6–2.6)

## 2021-09-26 LAB — PROTEIN / CREATININE RATIO, URINE
CREATININE, URINE: 291.9 mg/dL
PROTEIN URINE: 122.1 mg/dL
PROTEIN/CREAT RATIO, URINE: 0.418

## 2021-09-26 LAB — LACTATE SEPSIS, VENOUS
LACTATE BLOOD VENOUS: 0.8 mmol/L (ref 0.5–1.8)
LACTATE BLOOD VENOUS: 0.7 mmol/L (ref 0.5–1.8)

## 2021-09-26 LAB — TACROLIMUS LEVEL, TROUGH: TACROLIMUS, TROUGH: <1 ng/mL — ABNORMAL LOW (ref 5.0–15.0)

## 2021-09-26 LAB — LIPASE: LIPASE: 46 U/L (ref 12–53)

## 2021-09-26 LAB — PROTIME-INR
INR: 1.24
PROTIME: 14.1 s — ABNORMAL HIGH (ref 9.8–12.8)

## 2021-09-26 LAB — C4 COMPLEMENT: C4 COMPLEMENT: 38.6 mg/dL — ABNORMAL HIGH (ref 12.0–36.0)

## 2021-09-26 LAB — TSH: THYROID STIMULATING HORMONE: 0.433 u[IU]/mL — ABNORMAL LOW (ref 0.550–4.780)

## 2021-09-26 LAB — PHOSPHORUS: PHOSPHORUS: 6.8 mg/dL — ABNORMAL HIGH (ref 2.4–5.1)

## 2021-09-26 LAB — APTT
APTT: 20.3 s — ABNORMAL LOW (ref 25.1–36.5)
HEPARIN CORRELATION: <0.2

## 2021-09-26 LAB — HIGH SENSITIVITY TROPONIN I - SINGLE: HIGH SENSITIVITY TROPONIN I: 10 ng/L (ref <=34)

## 2021-09-26 LAB — PARATHYROID HORMONE (PTH): PARATHYROID HORMONE INTACT: 137.2 pg/mL — ABNORMAL HIGH (ref 18.5–88.1)

## 2021-09-26 LAB — B-TYPE NATRIURETIC PEPTIDE: B-TYPE NATRIURETIC PEPTIDE: 5 pg/mL (ref <=100)

## 2021-09-26 MED ADMIN — vancomycin (VANCOCIN) 1250 mg in sodium chloride (NS) 0.9 % 250 mL IVPB (premix): 1250 mg | INTRAVENOUS | @ 17:00:00 | Stop: 2021-09-26

## 2021-09-26 MED ADMIN — lactated ringers bolus 1,000 mL: 1000 mL | INTRAVENOUS | @ 21:00:00 | Stop: 2021-09-26

## 2021-09-26 MED ADMIN — diphenhydrAMINE (BENADRYL) injection 25 mg: 25 mg | INTRAVENOUS | @ 17:00:00 | Stop: 2021-09-26

## 2021-09-26 MED ADMIN — cefepime (MAXIPIME) 2 g in sodium chloride 0.9 % (NS) 100 mL IVPB-MBP: 2 g | INTRAVENOUS | @ 16:00:00 | Stop: 2021-09-26

## 2021-09-26 MED ADMIN — acetaminophen (TYLENOL) tablet 650 mg: 650 mg | ORAL | @ 21:00:00

## 2021-09-26 MED ADMIN — lactated ringers bolus 2,000 mL: 2000 mL | INTRAVENOUS | @ 15:00:00 | Stop: 2021-09-26

## 2021-09-26 MED FILL — PREDNISONE 5 MG TABLET: ORAL | 30 days supply | Qty: 30 | Fill #9

## 2021-09-26 MED FILL — MG-PLUS-PROTEIN 133 MG TABLET: ORAL | 30 days supply | Qty: 120 | Fill #7

## 2021-09-26 MED FILL — MYCOPHENOLATE SODIUM 180 MG TABLET,DELAYED RELEASE: ORAL | 30 days supply | Qty: 240 | Fill #10

## 2021-09-26 MED FILL — ENVARSUS XR 4 MG TABLET,EXTENDED RELEASE: ORAL | 30 days supply | Qty: 30 | Fill #7

## 2021-09-26 MED FILL — ENVARSUS XR 1 MG TABLET,EXTENDED RELEASE: ORAL | 30 days supply | Qty: 90 | Fill #8

## 2021-09-26 NOTE — Unmapped (Signed)
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 She is seen in follow up of her transplant and to address other medical problems. Active issues include:    Acute febrile illness, likely UTI with possible urosepsis  - Recently traveled to [place]   - Symptoms present for several days including dysuria, fever, chills, low back discomfort and brief period of diarrhea  - UA today reveals hematuria and pyuria.  - BP is 90's systolic   - Accompanied by AKI   - ED for volume expansion, cultures, renal US, initiation of empiric antibiotics, admission disposition.   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 The most likely etiology is ATN secondary to renal hypoperfusion versus prerenal state versus transplant pyelonephritis or some combination of these 3. It is also possible she has a flare of C3 glomerulopathy or rejection though these seem less likely.    - Kidney biopsy 12/29/19 confirmed presence of C3 glomerulopathy recurrence.   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   - Genetics panel was negative for causative variants 08/30/20.     - Treatment included an increase in the dose of Myfortic to 720 mg bid and initiation of prednisone 5 mg daily  - Serum creatinine had been  <1.3 mg/dl before developing AKI today  - UPC remains relatively low though no longer normal at 0.418.   - Rituximab was deferred at the time of recurrence diagnosis due to an active COVID-19 community spike and concern about infection risk. With improvement on current immunosuppression we have not reconsidered rituximab.  - She has been prepared for use of Eculizumab in case current therapy or rituximab proves ineffective in the future. This included completion of pre-eculizumab vaccination     -  C3 and C4 are normal today  - No plans for biopsy until she is treated for presumed urosepsis with possible pyelonephritis.    Immunosuppression Management [High Risk Medical Decision Making For Drug Therapy Requiring Intensive Monitoring For Toxicity]:   - Tacrolimus level pending (target trough is 6-10 ng/mL).   - Has tremors and headaches that preceded initiation of Envarsus.  - Envarsus 7 mg daily dose to be continued pending today's trough level  - Continue higher Myfortic dose of 720 mg bid due to C3 glomerulopathy recurrence  - Prednisone 5 mg daily due to C3 glomerulopathy recurrence, steroid free prior to C3G diagnosis    Infectious Prophylaxis and Monitoring: CMV & EBV: D+/R+  - PJP prophylaxis: completed (s/p Bactrim x 6 months)  - CMV prophylaxis: completed (s/p Valcyte x3 months).   - COVID-19 vaccination: completed 5 doses including bivalent booster 07/04/21. She is aware of the limited efficacy of this vaccine in transplant recipients.   - Received Evusheld 08/30/20  - Received Men B on 01/26/20,03/01/20, 03/21/20  - Received MenACWY 01/26/20, needs additional dose - pt will get locally, agan discussed with pt.today  - Booster doses of meningococcal vaccine every 1-5 years per CDC recommendations if chronic eculizumab therapy is initiated.    Seizures Disorder, no seizures since  2014:    - EEG with no evidence of seizures on 06/25/20 .   - Keppra taper is being initiated by Neurology team.  - She will follow up with Carolyn Ege, NP at [facility].       Myoclonus:   Will continue Klonopin, prescribed by PCP.  Continue [facility] physical therapy.      Metabolic Acidosis  CO2 is 16 today in setting of AKI. It had normalized with sodium bicarbonate 650 mg bid previously. Pseudoaneurysm of AVF  She saw Dr. Norma Fredrickson 09/21/19 with plan to defer any intervention until 1 year has passed from transplant. She has no high output heart failure by echo or symptoms of CHF. We would favor a nonurgent revision to minimize any risk of rupture, but spare the AVF for future use if possible to do so safely. Patient has been advised to contact us with any scabbing or erosion of the skin over the pseudoaneurysm.    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    Pap Smear: 11/11/20 was negative including negative HPV  She will follow up with GYN on August 21, 2021     Follow up  To ED for admission today  3 weeks for hospital follow up in transplant clinic    History of Present Illness    Carolyn Kelley is a 31 y.o. 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 Her post-operative course was complicated by hypotension and hypoxia thought to be caused by an anaphylactic reaction to Campath. She has experienced no episodes of rejection or infectious complications. Due to presence of proteinuria she underwent kidney biopsy on 12/29/19. The biopsy showed C3 glomerulopathy recurrence. In response to this diagnosis Myfortic was increased from 540 mg bid to 720 mg bid and she was started on prednisone 5 mg daily while tacrolimus was continued.    She recently traveled to [place]. She has a several day history of fevers, chills, dysuria, low back pain, gross hematuria, nausea, weakness, and drowsiness. Symptoms have been present for several days and are not improving. She notes reduced urine volume. She denies edema, chest pain or shortness of breath. Shortly after returning from [place] she had diarrhea and took a course of Azithromycin that she states has been completed. She has not checked home BP values. BP in clinic is 94 systolic.     Transplant History:  Date of Transplant: 03/10/19  Organ Received: Left DDKT, DBD, SCD, KDPI 2%; cold ischemia 18 hrs.  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    Medications  Current Outpatient Medications   Medication Sig Dispense Refill    acetaminophen (TYLENOL) 500 MG tablet Take 1-2 tablets (500-1,000 mg total) by mouth every  six (6) hours as needed for pain or fever (> 38C). 100 tablet 0    cholecalciferol, vitamin D3-125 mcg, 5,000 unit,, 125 mcg (5,000 unit) capsule Take 1 capsule (125 mcg total) by mouth daily. 100 capsule 11    clonazePAM (KLONOPIN) 0.5 MG tablet Take 1 tablet (0.5 mg total) by mouth two (2) times a day. 60 tablet 3    levETIRAcetam (KEPPRA) 250 MG tablet TAKE 1 TABLET BY MOUTH TWICE DAILY. 180 tablet 3    magnesium oxide-Mg AA chelate (MAGNESIUM, AMINO ACID CHELATE,) 133 mg Take 2 tablets by mouth Two (2) times a day. 120 tablet 11    mycophenolate (MYFORTIC) 180 MG EC tablet Take 4 tablets (720 mg total) by mouth Two (2) times a day. Adjust dose per medication card. 240 tablet 11    predniSONE (DELTASONE) 5 MG tablet Take 1 tablet (5 mg total) by mouth daily. 30 tablet 11    sodium bicarbonate 650 mg tablet Take 1 tablet (650 mg total) by mouth Two (2) times a day. 120 tablet 11    tacrolimus (ENVARSUS XR) 1 mg Tb24 extended release tablet Take three (1mg ) tablets with one (4mg ) tablet by mouth daily for a total daily dose of 7 mg. 90 tablet 11    tacrolimus (ENVARSUS XR) 4 mg Tb24 extended release tablet Take one (4mg ) tablet with three (1mg ) tablets by mouth daily for a total daily dose of 7 mg. 30 tablet 11    tretinoin (RETIN-A) 0.025 % cream Start every other night and increase to nightly as tolerated 45 g 3    tretinoin (RETIN-A) 0.025 % cream Apply 1 application. topically nightly. Nightly as tolerated 45 g 3     No current facility-administered medications for this visit.       Physical Exam   BP 94/62 (BP Site: R Arm, BP Position: Sitting, BP Cuff Size: Medium)  - Pulse 93  - Temp 36.4 ??C (97.6 ??F) (Temporal)    General: Patient is a pleasant female appears drowsy seated in wheelchair  Eyes: Sclera anicteric.  Lungs: Clear to auscultation bilaterally, no wheezes/rales/rhonchi.  Cardiovascular: Regular rate and rhythm without murmurs, rubs or gallops.  Abdomen: minimal lower abdominal tenderness  Extremities: Without edema, joints without evidence of synovitis; pseudoaneurysm of AVF LUE  Skin: Without rash  Neurological: uses a walker for ambulation due to myoclonus.       Laboratory Results and imaging Reviewed in EMR

## 2021-09-26 NOTE — Unmapped (Signed)
[facility]  Emergency Department Provider Note        ED Clinical Impression      Final diagnoses:   Generalized weakness (Primary)            HPI, Medical Decision Making & Impression, Progress Notes, and Critical Care      Impression, Differential Diagnosis and Plan of Care    September 26, 2021 10:48 AM   Carolyn Kelley is a 31 y.o. 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  Patient states that she traveled approximately 1 week ago.  When she arrived several days ago, she felt generally weak and unwell.  She felt like she had intermittent low-grade fevers and chills.  She also noted that she had some watery diarrhea that has since passed after she took a Z-Pak at home.  She now endorses some burning when she urinates as well as some blood in her urine.  She otherwise denies any abdominal pains, chest pains, upper respiratory symptoms such as cough or congestion.  She is not aware of any exposures while traveling.  No sick contacts at this time.    Outside Historian(s)  (EMS, Significant Other, Family, Parent, Caregiver, Friend, Patent examiner, etc.)  EMS.    External Records Reviewed  (Inpatient/Outpatient notes, Prior labs/imaging studies, Care Everywhere, PDMP, External ED notes, etc)  Transplant progress note from today.      BP 102/72  - Pulse 91  - Temp 37.1 ??C (98.8 ??F) (Oral)  - Resp 15  - SpO2 99%     EMS reported blood pressures in the 90s systolic, normal heart rate, afebrile, satting well on room air.  When she arrived here, she had similar vital signs.    She generally appeared very uncomfortable.  She was oriented x4 and alert, answering questions appropriately, moving all 4 extremities.  Her cardiopulmonary exam was benign. Abdominal exam showed no tenderness palpation, rebound or guarding or peritoneal signs.  Extremities are benign.    Assessment and Plan: Patient is on tacrolimus and given her immunosuppressed history, we will proceed with sepsis coverage including broad-spectrum antibiotics.  I suspect that this might be something infectious, likely cystitis/pyelonephritis.      I did review her transplant nephrology notes from today and there was a concern for UTI with possible urosepsis given that she had hematuria and pyuria as well as blood pressures in the 90s systolic during her outpatient visit today.  They also noted a significant AKI on the blood work.  I recommended volume expansion, cultures, renal ultrasound initiation of antibiotics with admission.      Sepsis  - concern for sepsis in setting of hypotension and hyperthermia  - bolused fluids: 30 ml/kg LR  - obtain lactate now and q6 hrs until < 2.0  - pancultured:        * peripheral blood cultures x2 sent, results pending       * UA ordered, results pending  - start broad-spectrum coverage with vanc/cefepime    Temp:  [37.1 ??C (98.8 ??F)] 37.1 ??C (98.8 ??F)  Recent Labs     09/26/21  1056   LACTATE 0.8 - 0.8             Diagnostic and treatment orders as below.  Orders Placed This Encounter   Procedures   ??? Blood Culture   ??? Blood Culture   ??? Respiratory Pathogen Panel with COVID-19   ??? Urine Culture   ??? XR Chest Portable   ??? US Renal Transplant W Doppler   ??? CBC w/ Differential   ??? Comprehensive Metabolic Panel   ??? Blood Gas Critical Care Panel, Venous   ??? Lactate Sepsis, Venous   ??? Urinalysis with Microscopy with Culture Reflex   ??? hsTroponin I (single, no delta)   ??? B-type natriuretic peptide (BNP)   ??? Lipase Level   ??? Magnesium Level   ??? TSH   ??? Protime-INR   ??? aPTT   ??? Misc nursing order (Sepsis Timer)   ??? Cardiac Monitor   ??? Oxygen sat continuous monitoring   ??? Notify Provider   ??? In and Out (I & O) cath   ??? Notify Provider   ??? Obtain medical records   ??? Vital signs   ??? Notify pharmacy immediately that the ED Adult Sepsis order set has been initiated.   ??? Adult Oxygen therapy   ??? ECG 12 Lead   ??? Insert peripheral IV   ??? Insert 2nd peripheral IV       Medications   cefepime (MAXIPIME) 2 g in sodium chloride 0.9 % (NS) 100 mL IVPB-MBP (0 g Intravenous Stopped 09/26/21 1242)   vancomycin (VANCOCIN) 1250 mg in sodium chloride (NS) 0.9 % 250 mL IVPB (premix) (0 mg Intravenous Stopped 09/26/21 1409)   lactated ringers bolus 2,000 mL (0 mL Intravenous Stopped 09/26/21 1313)   diphenhydrAMINE (BENADRYL) injection 25 mg (25 mg Intravenous Given 09/26/21 1248)         Any discussions with consultants that are involved in the patient's care, individual interpretations of any studies that were obtained, considerations regarding prescriptions, further escalation of care including admission, and additional progress notes are all documented in the ED course below.    ED Course as of 09/26/21 1541   Fri Sep 26, 2021   1123 Chest x-ray, which I have independently interpreted, without any acute cardiopulmonary process.  No obvious focal pulmonary infiltrates, pulmonary edema, pleural effusions, obvious pneumothorax or rib fractures.    I personally reviewed patient's EKG which shows normal sinus rhythm at a rate of 91 bpm with normal PR, QRS and QTc intervals.  Unchanged from prior EKG.   1123 VBG with a slight acidosis and a bicarb that is low at 17.  Blood work is otherwise pending.   1144 Patient's creatinine is at 5.54, bicarb low at 16 with a small anion gap.  She is hyponatremic to 127.   1414 Transplant nephrology recommended admission to medicine.    UA showing large leuk esterases with blood and WBCs as well as high squamous epithelial cells.  Will attempt to get a new, cleaner sample.    Of note, patient had a rash develop when cefepime was started.  We placed hives as a reaction to cefepime in her file.  We stopped this and give Benadryl.   1500 Signed out to oncoming resident pending conversation with admitting team.          Portions of this record have been created using [employer] dictation software. Dictation errors have been sought, but may not have been identified and corrected.    The case was discussed with Dr. Jonetta Speak, MD who is in agreement with the above assessment and plan    See chart and resident  provider documentation for details.    ____________________________________________         History      Reason for Visit  Possible Sepsis          Past Medical History:   Diagnosis Date   ??? Abnormal Pap smear of cervix    ??? Acne    ??? 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 Chronic kidney disease    ??? Depressive disorder, not elsewhere classified    ??? Migraine with aura, without mention of intractable migraine without mention of status migrainosus    ??? Seizures (CMS-HCC)    ??? Unspecified vitamin B deficiency        Patient Active Problem List   Diagnosis   ??? Cardiac arrest (CMS-HCC)   ??? Iron deficiency anemia   ??? Ascites   ??? Cardiomyopathy (CMS-HCC)   ??? Abnormal gait   ??? Myoclonus   ??? Generalized convulsive epilepsy (CMS-HCC)   ??? Cerebral anoxic injury (CMS-HCC)   ??? Seizures (CMS-HCC)   ??? Post hypoxic myoclonus   ??? Atypical squamous cells of undetermined significance on cytologic smear of cervix (ASC-US)   ??? Deceased-donor kidney transplant recipient   ??? MPGN (membranoproliferative glomerulonephritis), type 2   ??? Secondary hyperparathyroidism of renal origin (CMS-HCC)   ??? Hypertension   ??? C. difficile colitis   ??? Abnormal Pap smear of cervix   ??? Coagulation defect, unspecified (CMS-HCC)   ??? Immunosuppression (CMS-HCC)       Past Surgical History:   Procedure Laterality Date   ??? AV FISTULA PLACEMENT Left    ??? PR TRANSPLANT,PREP CADAVER RENAL GRAFT N/A 03/10/2019    Procedure: [facility] STD PREP CAD DONR RENAL ALLOGFT PRIOR TO TRNSPLNT, INCL DISSEC/REM PERINEPH FAT, DIAPH/RTPER ATTAC;  Surgeon: Leona Carry, MD;  Location: MAIN OR [facility];  Service: Transplant   ??? PR TRANSPLANTATION OF KIDNEY N/A 03/10/2019    Procedure: RENAL ALLOTRANSPLANTATION, IMPLANTATION OF GRAFT; WITHOUT RECIPIENT NEPHRECTOMY;  Surgeon: Leona Carry, MD;  Location: MAIN OR [facility];  Service: Transplant       No current facility-administered medications for this encounter.    Current Outpatient Medications:   ???  acetaminophen (TYLENOL) 500 MG tablet, Take 1-2 tablets (500-1,000 mg total) by mouth every six (6) hours as needed for pain or fever (> 38C)., Disp: 100 tablet, Rfl: 0  ???  cholecalciferol, vitamin D3-125 mcg, 5,000 unit,, 125 mcg (5,000 unit) capsule, Take 1 capsule (125 mcg total) by mouth daily., Disp: 100 capsule, Rfl: 11  ???  clonazePAM (KLONOPIN) 0.5 MG tablet, Take 1 tablet (0.5 mg total) by mouth two (2) times a day., Disp: 60 tablet, Rfl: 3  ???  levETIRAcetam (KEPPRA) 250 MG tablet, TAKE 1 TABLET BY MOUTH TWICE DAILY., Disp: 180 tablet, Rfl: 3  ???  magnesium oxide-Mg AA chelate (MAGNESIUM, AMINO ACID CHELATE,) 133 mg, Take 2 tablets by mouth Two (2) times a day., Disp: 120 tablet, Rfl: 11  ???  mycophenolate (MYFORTIC) 180 MG EC tablet, Take 4 tablets (720 mg total) by mouth Two (2) times a day. Adjust dose per medication card., Disp: 240 tablet, Rfl: 11  ???  predniSONE (DELTASONE) 5 MG tablet, Take 1 tablet (5 mg total) by mouth daily., Disp: 30 tablet, Rfl: 11  ???  sodium bicarbonate 650 mg tablet, Take 1 tablet (650 mg total) by mouth Two (2) times a day., Disp: 120 tablet, Rfl: 11  ???  tacrolimus (ENVARSUS XR) 1 mg Tb24 extended release tablet,  Take three (1mg ) tablets with one (4mg ) tablet by mouth daily for a total daily dose of 7 mg., Disp: 90 tablet, Rfl: 11  ???  tacrolimus (ENVARSUS XR) 4 mg Tb24 extended release tablet, Take one (4mg ) tablet with three (1mg ) tablets by mouth daily for a total daily dose of 7 mg., Disp: 30 tablet, Rfl: 11  ???  tretinoin (RETIN-A) 0.025 % cream, Apply 1 application. topically nightly. Nightly as tolerated, Disp: 45 g, Rfl: 3    Allergies  Bee pollen, Cefepime, and Pollen extracts    Family History   Problem Relation Age of Onset   ??? Migraines Mother    ??? Diabetes Father    ??? Hypertension Father    ??? Seizures Brother    ??? Diabetes Maternal Grandmother    ??? Hypertension Maternal Grandmother    ??? Heart attack Maternal Grandmother    ??? Melanoma Neg Hx    ??? Basal cell carcinoma Neg Hx    ??? Squamous cell carcinoma Neg Hx    ??? Cancer Neg Hx        Social History  Social History     Tobacco Use   ??? Smoking status: Never   ??? Smokeless tobacco: Never   Substance Use Topics   ??? Alcohol use: No   ??? Drug use: No          Physical Exam     This provider entered the patient's room: Yes    ??? If this provider did enter the patient room, the following was PPE worn: Surgical mask, eye protection and gloves    Vitals:    09/26/21 1025 09/26/21 1046   BP:  102/72   Pulse: 90 91   Resp:  15   Temp:  37.1 ??C (98.8 ??F)   TempSrc:  Oral   SpO2: 97% 99%         Constitutional: Alert and oriented. Appears uncomfortable.  Eyes: Conjunctivae are normal.  ENT       Head: Normocephalic and atraumatic.       Nose: No congestion.       Mouth/Throat: Mucous membranes are moist.       Neck: No stridor.  Cardiovascular: Normal rate, regular rhythm. Normal and symmetric distal pulses are present in all extremities.  Respiratory: Normal respiratory effort. Breath sounds are normal.  Gastrointestinal: Abdomen is generally soft, no obvious distention.  Genitourinary: Deferred.   Musculoskeletal: Normal range of motion in all extremities.  Neurologic: Normal speech and language. No gross focal neurologic deficits are appreciated.  Skin: Skin is warm, dry and intact. No rash noted over visible skin.  Psychiatric: Mood and affect are normal. Speech and behavior are normal.        Results (Radiology, Laboratory)     US Renal Transplant W Doppler   Final Result   No significant change compared with prior study. Grossly stable resistive indices in the renal transplant arteries, borderline at the main renal artery anastomosis.      Please see below for data measurements:      Transplant location: RLQ      Renal Transplant: Sagittal 12.8 cm; AP 5.8 cm; Transverse 5.7 cm      Segmental artery superior resistive index: 0.72   Segmental artery mid resistive index: 0.66   Segmental artery inferior resistive index: 0.61      Previous resistive indices range of segmental arteries: 0.62-0.69      Main renal artery peak systolic velocity at anastomosis: 1.21  m/s   Main renal artery hilum resistive index: 0.67   Main renal artery mid resistive index: 0.72   Main renal artery anastomosis resistive index: 0.82      Previous resistive indices range of main renal artery: 0.74-0.78      Main renal vein: patent      Iliac artery: Patent   Iliac vein: Patent      Bladder volume prevoid: 58.8  mL          XR Chest Portable   Final Result      No acute abnormalities.          Labs Reviewed   COMPREHENSIVE METABOLIC PANEL - Abnormal; Notable for the following components:       Result Value    Sodium 127 (*)     Chloride 96 (*)     CO2 16.0 (*)     Anion Gap 15 (*)     BUN 104 (*)     Creatinine 5.54 (*)     eGFR CKD-EPI (2021) Female 10 (*)     Albumin 3.3 (*)     ALT 87 (*)     All other components within normal limits   BLOOD GAS CRITICAL CARE PANEL, VENOUS - Abnormal; Notable for the following components:    pH, Venous 7.29 (*)     pCO2, Ven 36 (*)     HCO3, Ven 17 (*)     Base Excess, Ven -8.7 (*)     Sodium Whole Blood 130 (*)     Hgb, blood gas 11.40 (*)     All other components within normal limits   URINALYSIS WITH MICROSCOPY WITH CULTURE REFLEX - Abnormal; Notable for the following components:    Leukocyte Esterase, UA Large (*)     Protein, UA 50 mg/dL (*)     Blood, UA Large (*)     RBC, UA 55 (*)     WBC, UA 51 (*)     Squam Epithel, UA 21 (*)     Bacteria, UA Many (*)     Hyaline Casts, UA 4 (*)     Mucus, UA Rare (*)     All other components within normal limits   TSH - Abnormal; Notable for the following components:    TSH 0.433 (*)     All other components within normal limits   PROTIME-INR - Abnormal; Notable for the following components:    PT 14.1 (*)     All other components within normal limits   APTT - Abnormal; Notable for the following components:    APTT 20.3 (*)     All other components within normal limits   CBC W/ AUTO DIFF - Abnormal; Notable for the following components:    RBC 3.06 (*)     HGB 9.4 (*)     HCT 27.3 (*)     All other components within normal limits    Narrative:     WBC, PLT and/or Differential requires smear review. Expected TAT is 2 hours.   MANUAL DIFFERENTIAL - Abnormal; Notable for the following components:    Smear Review Comments See Comment (*)     Burr Cells Present (*)     Dohle Bodies Present (*)     Toxic Vacuolation Present (*)     All other components within normal limits   RESPIRATORY PATHOGEN PANEL WITH COVID - Normal    Narrative:     This result was obtained using the FDA-cleared  [employer] Respiratory 2.1 Panel. Performance characteristics have been established and verified by the Clinical Molecular Microbiology Laboratory, [facility]. This assay does not distinguish between rhinovirus and enterovirus. Lower respiratory specimens will not be tested for Bordetella pertussis/parapertussis. For nasopharyngeal swabs, cross-reactivity may occur between B. pertussis and non-pertussis Bordetella species. All positive B. pertussis results will be automatically confirmed using our in-house PCR assay. Ct (cycle threshold) values for SARS-CoV-2 are not available for this test.   LACTATE SEPSIS, VENOUS - Normal   HIGH SENSITIVITY TROPONIN I - SINGLE - Normal   B-TYPE NATRIURETIC PEPTIDE - Normal   LIPASE - Normal   MAGNESIUM - Normal   BLOOD CULTURE   BLOOD CULTURE   URINE CULTURE   CBC W/ DIFFERENTIAL    Narrative:     The following orders were created for panel order CBC w/ Differential.  Procedure                               Abnormality         Status ---------                               -----------         ------                     CBC w/ Differential[[id number]]         Abnormal            Final result               Manual Differential[[id number]]         Abnormal            Final result                 Please view results for these tests on the individual orders.   LACTATE SEPSIS, VENOUS       Pertinent labs & imaging results that were available during my care of the patient were reviewed by me and considered in my medical decision making (see chart for details).        Please note- This chart has been created using [employer]. Chart creation errors have been sought, but may not always be located and such creation errors, especially pronoun confusion, do NOT reflect on the standard of medical care.     Melene Plan, MD  Resident  09/26/21 [phone]

## 2021-09-26 NOTE — Unmapped (Addendum)
Carolyn Kelley [id number]. 31 F DDKT  pw fever, likely UTI after travel to [place] c/f urosepsis. Thanks! [name] [phone]      Transplant 2020?    [facility] pt    Watery diarrhea  4-5 days when she got home from    Dysuria gradually increased     Drowsy, confused     AKI   Prerenal   UTI?    CMV??     CrCl cannot be calculated (Unknown ideal weight.).        Cefepime???     Hematuria - if doesn't improve with abx and volume resu may need bx     Continue tac - tac level tomorrow   Hold mico   Hold ASA

## 2021-09-26 NOTE — Unmapped (Signed)
Nephrology ([facility]) History & Physical    Assessment & Plan:   Carolyn Kelley is a 31 y.o. 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     Principal Problem:    Acute kidney injury (CMS-HCC)  Active Problems:    UTI (urinary tract infection)    History of immunosuppression therapy    History of international travel  Resolved Problems:    * No resolved hospital problems. *        Active Problems    Acute kidney injury - Febrile illness - UTI with possible urosepsis - Metabolic acidosis   Urine microscopy notable for bacteria otherwise no significant findings. Given patient's persistent diarrhea causing dehydration and hypotension in clinic, etiology of AKI likely prerenal with concomitant infection. UA today reveals hematuria and pyuria - likely source of infection UTI with urosepsis. Would also consider disseminated gonococcal infection. Persistent diarrhea also c/f C diff given past history of this problem. Patient has acidosis of 16.0 at ED, although noted to improve with home bicarbonate. Will need to closely assess if renal status improves and restart immunosuppression if suspected prerenal etiology as there exists the question of whether patient was taking immunosuppressive medications during her trip. If AKI not improving then need to consider kidney biopsy.   - Follow-up urine and blood cultures  - CMV DNA PCR  - C diff assay  - HIV, GC NAA urine, repeat UA and UCx  - Replete fluids  - Follow-up GI pathogen panel  - Daily labs  - Daptomcyin and pip/tazo. 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 It is possible she has a flare of C3 glomerulopathy or rejection though these seem less likely. Informed by Dr. Clelia Croft patient did not take Myfortic while in [place], therefore low threshold to restart if AKI seems due to dehydration and infection and is improving tomorrow. Tacrolimus level noted to be <1.0 7/7 morning. Also Consider kidney biopsy if suspicion increases.   - Hold home mycophenolate for now, low threshold to restart 7/8  - Hold aspirin   - Continue home Envarsus  - Continue home prednisone  - Trend tacrolimus levels    Chronic Problems     Seizures Disorder, no seizures since 2014:    EEG with no evidence of seizures on 06/25/20. Keppra taper is being initiated by Neurology team. She will follow up with [name], NP at [facility].        Myoclonus:   Will continue Klonopin, prescribed by PCP. Continue [facility] physical therapy.    - Continue home clonazepam     The patient's presentation is complicated by the following clinically significant conditions requiring additional evaluation and treatment: - Chronic kidney disease POA requiring further investigation, treatment, or monitoring   - Hyponatremia POA requiring further investigation, treatment, or monitoring  - Acidosis POA requiring further investigation, treatment, or monitoring  - Dehydration POA requiring further investigation, treatment, or monitoring  - Volume depletion POA requiring further investigation, treatment, or monitoring       Issues Impacting Complexity of Management:      Checklist:  Diet: Regular Diet  DVT PPx: Heparin 5000units q8h  Code Status: Full Code  Dispo: Patient appropriate for  ??  Inpatient based on expectation of ongoing need for hospitalization greater than two midnights and severity of presentation/services including IV antibiotics and elevated nursing care in stepdown unit.    Team Contact Information:   Primary Team: Nephrology ([facility])  Primary Resident: Jeannetta Nap, MD  Resident's Pager: [phone] (Nephrology Intern - Alvester Morin)    Chief Concern: Acute kidney injury (CMS-HCC)    Subjective:   Carolyn Kelley is a 31 y.o. 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     History obtained by patient and mother.     HPI:  Regarding the patient's transplant, her post-operative course was complicated by hypotension and hypoxia thought to be caused by an anaphylactic reaction to [place]. She has experienced no episodes of rejection or infectious complications.     Patient reports that she started experiencing fever, chills, and generalized weakness on 6/29. She recently returned from a 2-week trip to [place] on 6/27. During the trip she experienced intermittent watery diarrhea - notable foods she recalls eating included various meats (beef, pork, chicken) prepared by others and self ([employer]), as well as cashews and multiple types of rice. She was prescribed azithromycin for the trip but started taking it after she returned which resolved the diarrhea. However, since 6/29 she has had progressively worsening fever (Tmax 100.9), chills, generalized weakness, dysuria, low back pain, 1x episode of gross hematuria, nausea, and drowsiness. She also notes reduced urine volume.     She presented to Transplant Nephrology clinic today with a serum Cr 5.48 up from 1.09 on 08/26/21 (baseline <1.3 mg/dL) and systolic BP in 45W and was sent to [facility] ED. In the ED, she received 2g cefepime ( over 30 min) but was discontinued after 26 minutes due to urticarial rash developing on her face. The rash has been resolving over the past few hours and is still pruritic.    She denies edema, chest pain, shortness of breath, and vomiting.       Pertinent Surgical Hx  - DDKT 03/09/19    Pertinent Social Hx   - Recent international travel (see HPI)    Allergies  Bee pollen, Cefepime, and Pollen extracts    I reviewed the Medication List. The current list is Accurate    Designated Healthcare Decision Maker:  Ms. Croy currently has decisional capacity for healthcare decision-making and is able to designate a surrogate healthcare decision maker. Ms. Caruthers designated healthcare decision maker(s) is/are Rogelia Boga (the patient's parent) as denoted by stated patient preference.    Objective:   Physical Exam:  Temp:  [36.4 ??C (97.6 ??F)-37.3 ??C (99.2 ??F)] 37.3 ??C (99.2 ??F)  Heart Rate:  [89-107] 105  Resp:  [13-47] 39  BP: (94-137)/(62-93) 136/92  SpO2:  [77 %-100 %] 99 %    Gen: NAD, converses   Eyes: Sclera anicteric, EOMI grossly normal   HENT: Atraumatic, normocephalic  Neck: Trachea midline  Heart: RRR  Lungs: CTAB, no crackles or wheezes  Abdomen: Soft, NTND  Extremities: No edema  Neuro: Grossly symmetric, non-focal    Skin:  Faint erythematous macules and urticarial papules on face and upper neck   Psych: Alert, oriented

## 2021-09-26 NOTE — Unmapped (Signed)
Pt brought in by EMS for possible sepsis. EMS reports pt comes from clinic for abnormal labs. Less responsive than normal. GCS 15, A&O x 4.

## 2021-09-26 NOTE — Unmapped (Signed)
Per MD pt needs to go to the ED via EMS. 911 called at 9:26. Pts mother in room with pt.

## 2021-09-26 NOTE — Unmapped (Signed)
I spent 15 minutes with the patient at her clinic visit. I reviewed and updated his medications. She took her envarsus yesterday morning, she is not sure what time but did not take it this am. She had a fever 100.8-99.8 Fri-Sun last week and still does not feel the best but is drinking and eating ok. She has some burning and pain with urination and has some blood in her urine. She took a zpak that was given while traveling for some loose stools that she had at the end of her trip and that loose stools resolved. She has had some tylenol off and on. She denies any swelling, NV, cough or sore throat. She has not missed any meds but has been late with this recent illness.

## 2021-09-26 NOTE — Unmapped (Signed)
IMMUNOCOMPROMISED HOST INFECTIOUS DISEASE CONSULT NOTE    Carolyn Kelley is being seen in consultation at the request of Abhijit Clinton Quant* for evaluation of hypotension with concern for sepsis with urinary source.    Assessment/Recommendations:    Carolyn Kelley is a 31 y.o. female 31 y.o. 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 ID consulted for sepsis of unclear etiology.     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  - Serologies: CMV D+/R+, EBV D+/R+, Toxo D?/R-  - Induction: Campath  - Surgical complications: hypotension and hypoxia thought to be caused by an anaphylactic reaction to Campath  - Prophylaxis prior to admission: None  - History of rejection if any: None  - Immunosuppression: Myfortic 720 mg bid (increased due to C3 glomerulonephropathy recurrence), prednisone 5 mg daily (restarted due to C3 glomerulonephropathy recurrence), Envarsus 7 mg daily (target trough is 6-10 ng/mL)    Pertinent comorbidities:  Seizure disorder  Pseudoaneurysm of AVF   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    Pertinent exposures:  Recent travel to [place] (6/15-6/29)    Summary of pertinent prior infections:  None    Active infection:  #Sepsis, 09/26/20  #Diarrhea, 09/20/21  #Dysuria, 09/20/21  - History of recent travel to [place], and presented on 7/7 with ~1 week of fatigue, dysuria, diarrhea (resolved), and lower abdominal pain, found to be hypotensive and tachycardic  - 7/7 CXR without acute abnormalities, RPP negative, UA with squams  - 7/7 BCx, UCx, CMV, BK pending    Antimicrobial allergy/intolerance:  Cefepime - facial rash       RECOMMENDATIONS    Diagnosis  Follow up C diff, GIPP, serum CMV, BK virus  Follow up 7/7 BCx  Follow up 7/7 UCx  Repeat UA and UCx  Send HIV, RPR, urine gonorrhea, urine chlamydia    Management  Stop cefepime and vancomycin  Start piperacillin-tazobactam 4.5g q6h, renal dosing per pharmacy  Start daptomycin 8 mg/kg daily, renal dosing per pharmacy    Antimicrobial prophylaxis required for host deficiency: transplant immunosuppression  None    Intensive toxicity monitoring for prescription antimicrobials   CBC w/diff at least once per week  CMP at least once per week  creatine kinase at least once per week  clinical assessments for myopathy (daptomycin)  clinical assessments for rashes or other skin changes    The [facility] ID service will continue to follow.           Please page the ID Transplant/Liquid Oncology Fellow consult at [phone] with questions.  Patient discussed with Dr. Julaine Hua.    Althia Forts, MD  [facility] Division of Infectious Diseases    History of Present Illness:      External record(s): Consultant note(s): transplant nephrology note with plan to send patient to ED .    Independent historian(s): was required due to patient status (minimally interactive due to acute illness)..       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    Most of the history is provided by her mother, who is at bedside, because the patient is minimally interactive and not able to meaningfully participate in the interview. She returned from [place] on 6/29, and was  very fatigued, but it was initially attributed to jet lag. She subsequently started to experience low grade fevers to 100.40F and diarrhea (mother is not sure how many BMs  she was having a day). She took a course of azithromycin, which she had previously been prescribed to take in the event of traveler's diarrhea. Her diarrhea improved, but she remained fatigued, and was sleeping all day, and not eating or drinking. She also noted burning on urination, lower abdominal pain, and red-tinged urine. She also had body aches and back pain. She did seem to have slightly labored breathing per her mother, but no cough.    She was seen in transplant nephrology clinic on 7/7, and was noted to have low BP (SBP 90s) and elevated Cr to 5.48. She was directed to the ED. Her temperature was 37.3, BP 90s/60s, HR 107. BCx were drawn and she was started on cefepime and vancomycin, with facial redness after administration of cefepime.    Allergies:  Allergies   Allergen Reactions    Bee Pollen Swelling     Sneezing, congestion  denies swelling.  Sneezing, congestion  denies swelling.      Cefepime Hives    Pollen Extracts Swelling       Medications:   Antimicrobials:  Anti-infectives (From admission, onward)      Start     Dose/Rate Route Frequency Ordered Stop    09/26/21 2100  vancomycin (VANCOCIN) IVPB 1000 mg (premix)        See Hyperspace for full Linked Orders Report.    1,000 mg  over 60 Minutes Intravenous Every 12 hours 09/26/21 1619 10/01/21 2059            Current/Prior immunomodulators per problem list. No change since admission.    Other medications reviewed.     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    Past Surgical History:   Procedure Laterality Date    AV FISTULA PLACEMENT Left     PR TRANSPLANT,PREP CADAVER RENAL GRAFT N/A 03/10/2019    Procedure: [facility] STD PREP CAD DONR RENAL ALLOGFT PRIOR TO TRNSPLNT, INCL DISSEC/REM PERINEPH FAT, DIAPH/RTPER ATTAC;  Surgeon: Leona Carry, MD;  Location: MAIN OR [facility];  Service: Transplant    PR TRANSPLANTATION OF KIDNEY N/A 03/10/2019    Procedure: RENAL ALLOTRANSPLANTATION, IMPLANTATION OF GRAFT; WITHOUT RECIPIENT NEPHRECTOMY;  Surgeon: Leona Carry, MD;  Location: MAIN OR [facility];  Service: Transplant     Denies prior surgeries with retained hardware.    Social History:  Tobacco use:   reports that she has never smoked. She has never used smokeless tobacco.   Alcohol use:    reports no history of alcohol use.   Drug use:    reports no history of drug use.   Living situation:  Lives with family (mom, dad, brother)   [place]idence:   [place]   [place]     [place] travel:   Has traveled to [employer] travel:   Has traveled to [place]             Pets and animal exposure:  Animal exposures include went to water zoo in [place], did not touch animals, no pets at home   Insect exposure:  No tick exposure   Hobbies:  Denies unusual environmental exposures   TB exposures:  No known TB exposure        Other significant exposures:  Notes eating a lot of fruit in [place]     Family History:  Family History   Problem Relation Age of Onset    Migraines Mother     Diabetes Father     Hypertension Father     Seizures Brother     Diabetes Maternal Grandmother     Hypertension Maternal Grandmother     Heart attack Maternal Grandmother     Melanoma Neg Hx     Basal cell carcinoma Neg Hx     Squamous cell carcinoma Neg Hx     Cancer Neg Hx             Vital Signs last 24 hours:  Temp:  [36.4 ??C (97.6 ??F)-37.3 ??C (99.2 ??F)] 37.3 ??C (99.2 ??F)  Heart Rate:  [89-107] 105  Resp:  [13-47] 39  BP: (94-137)/(62-93) 136/92  MAP (mmHg):  [100-106] 105  SpO2:  [77 %-100 %] 99 %    Physical Exam:  Patient Lines/Drains/Airways Status       Active Active Lines, Drains, & Airways       Name Placement date Placement time Site Days    Peripheral IV 09/26/21 Right Antecubital 09/26/21  1045  Antecubital  less than 1    Arteriovenous Fistula - Vein Graft  Access Arteriovenous fistula Left;Upper Arm --  --  Arm  --                  Const [x]  vital signs above    []  NAD, non-toxic appearance []  Chronically ill-appearing, non-distressed  Ill-appearing, fatigued      Eyes []  Lids normal bilaterally, conjunctiva anicteric and noninjected OU     [] PERRL  [] EOMI        ENMT [x]  Normal appearance of external nose and ears, no nasal discharge        []  MMM, no lesions on lips or gums [x]  No thrush, leukoplakia, oral lesions  [x]  Dentition good []  Edentulous []  Dental caries present  []  Hearing normal  []  TMs with good light reflexes bilaterally   Dry, cracked lips  Subtle facial swelling      Neck [x]  Neck of normal appearance and trachea midline        []  No thyromegaly, nodules, or tenderness   []  Full neck ROM        Lymph [x]  No LAD in neck     [x]  No LAD in supraclavicular area     []  No LAD in axillae   []  No LAD in epitrochlear chains     []  No LAD in inguinal areas        CV []  RRR            []  No peripheral edema     []  Pedal pulses intact   []  No abnormal heart sounds appreciated   [x]  Extremities WWP   Tachycardic, systolic murmur      Resp [x]  Normal WOB at rest    [x]  No breathlessness with speaking, no coughing  []  CTA anteriorly    [x]  CTA posteriorly  Mild wheezing anteriorly      GI [x]  Normal inspection, ND   [x]  NABS     []  No umbilical hernia on exam       []  No hepatosplenomegaly     []  Inspection of perineal and perianal areas normal  Healed surgical scars, tenderness over suprapubic region      GU []  Normal external genitalia     [x] No urinary catheter present in urethra   [x]  No CVA tenderness    [x]  No tenderness over renal allograft  Marked suprapubic tenderness      MSK []  No clubbing or cyanosis of hands       [x]  No vertebral point tenderness  [x]  No focal tenderness or abnormalities on palpation of joints in RUE, LUE, RLE, or LLE        Skin []  No rashes, lesions, or ulcers of visualized skin     [x]  Skin warm and dry to palpation   Erythematous rash on face  LUE fistula without erythema, TTP, or open wounds      Neuro []  Face expression symmetric  []  Sensation to light touch grossly intact throughout    [x]  Moves extremities equally    []  No tremor noted        [x]  CNs II-XII grossly intact     []  DTRs normal and symmetric throughout []  Gait unremarkable  Tremulous      Psych []  Appropriate affect       []  Fluent speech         []  Attentive, good eye contact  []  Oriented to person, place, time          []  Judgment and insight are appropriate   Somnolent, arousable        Data for Medical Decision Making     (09/26/21) EKG QTcF 411 ms    I discussed mgm't w/qualified health care professional(s) involved in case: antibiotic plan .    I reviewed CBC results (WBC normal at 4.3, Plts normal at >258), chemistry results (Cr elevated at 5.54), micro result(s) (BCx and UCx pending) and RPP negative .    I independently visualized/interpreted plain film images (CXR without acute abnormalities).       Recent Labs   Lab Units 09/26/21  1056 09/26/21  0806   WBC 10*9/L 4.3 2.4*   HEMOGLOBIN g/dL 9.4* 81.1*   PLATELET COUNT (1) 10*9/L >258 283   NEUTRO ABS 10*9/L 1.9 0.8*   SODIUM WHOLE BLOOD mmol/L 130*  --    SODIUM mmol/L 127* 128*   POTASSIUM WHOLE BLOOD mmol/L 3.9  --    POTASSIUM mmol/L 3.8 3.8   BUN mg/dL 914* 782*   CREATININE mg/dL 9.56* 2.13*   GLUCOSE mg/dL 92 99   CALCIUM mg/dL 9.7 08.6   MAGNESIUM mg/dL 2.6 2.8*   PHOSPHORUS mg/dL  --  6.8*   BILIRUBIN TOTAL mg/dL 0.5 0.5   AST U/L 33 34   ALT U/L 87* 95*   HEMOGLOBIN A1C %  --  5.2       Microbiology:  Microbiology Results (last day)       Procedure Component Value Date/Time Date/Time    Urine Culture [[id number]] Collected: 09/26/21 1316    Lab Status: In process Specimen: Urine from Clean Catch Updated: 09/26/21 1407    Respiratory Pathogen Panel with COVID-19 [[id number]]  (Normal) Collected: 09/26/21 1058    Lab Status: Final result Specimen: Nasopharyngeal Swab Updated: 09/26/21 1241     Adenovirus Not  Detected     Coronavirus HKU1 Not Detected     Coronavirus NL63 Not Detected Coronavirus 229E Not Detected     Coronavirus OC43 PCR Not Detected     Metapneumovirus Not Detected     Rhinovirus/Enterovirus Not Detected     Influenza A Not Detected     Influenza B Not Detected     Parainfluenza 1 Not Detected     Parainfluenza 2 Not Detected     Parainfluenza 3 Not Detected     Parainfluenza 4 Not Detected     RSV Not Detected     Bordetella pertussis Not Detected     Comment: If B. pertussis/parapertussis infection is suspected, the Bordetella pertussis/parapertussis Qualitative PCR test should be ordered.        Bordetella parapertussis Not Detected     Chlamydophila (Chlamydia) pneumoniae Not Detected     Mycoplasma pneumoniae Not Detected     SARS-CoV-2 PCR Not Detected    Narrative:      This result was obtained using the FDA-cleared [employer] Respiratory 2.1 Panel. Performance characteristics have been established and verified by the Clinical Molecular Microbiology Laboratory, [facility]. This assay does not distinguish between rhinovirus and enterovirus. Lower respiratory specimens will not be tested for Bordetella pertussis/parapertussis. For nasopharyngeal swabs, cross-reactivity may occur between B. pertussis and non-pertussis Bordetella species. All positive B. pertussis results will be automatically confirmed using our in-house PCR assay. Ct (cycle threshold) values for SARS-CoV-2 are not available for this test.    Blood Culture [[id number]] Collected: 09/26/21 1058    Lab Status: In process Specimen: Blood from 1 Peripheral Draw Updated: 09/26/21 1207    Blood Culture [[id number]] Collected: 09/26/21 1120    Lab Status: In process Specimen: Blood from 1 Peripheral Draw Updated: 09/26/21 1200            Imaging:  ECG 12 Lead    Result Date: 09/26/2021  NORMAL SINUS RHYTHM NORMAL ECG WHEN COMPARED WITH ECG OF 10-Mar-2019 05:20, NO SIGNIFICANT CHANGE WAS FOUND Confirmed by Christella Noa ([id number]) on 09/26/2021 11:14:34 AM    XR Chest Portable    Result Date: 09/26/2021  EXAM: XR CHEST PORTABLE DATE: 09/26/2021 11:02 AM ACCESSION: [id number] UN DICTATED: 09/26/2021 11:14 AM INTERPRETATION LOCATION: [facility]     CLINICAL INDICATION: 32 years old Female with FEVER      TECHNIQUE: Single View AP Chest Radiograph.     COMPARISON: Chest 03/29/2020     FINDINGS:     Low lung volumes. Lungs are clear. No pleural effusion or pneumothorax.     Unremarkable cardiomediastinal silhouette.             No acute abnormalities.    US Renal Transplant W Doppler    Result Date: 09/26/2021  EXAM: US RENAL TRANSPLANT W DOPPLER DATE: 09/26/2021 ACCESSION: [id number] UN DICTATED: 09/26/2021 11:46 AM INTERPRETATION LOCATION: [facility]     CLINICAL INDICATION: 31 years old Female with kidney transplant     COMPARISON: Renal transplant Doppler 04/26/2020     TECHNIQUE:  Ultrasound views of the renal transplant were obtained using gray scale and color and spectral Doppler imaging. Views of the urinary bladder were obtained using gray scale and limited color Doppler imaging.     FINDINGS:     TRANSPLANTED KIDNEY: The renal transplant was located in the right lower quadrant. Normal size and echogenicity.  No solid masses or calculi. No perinephric collections identified. No hydronephrosis.     VESSELS: - Perfusion: Using power Doppler,  normal perfusion was seen throughout the renal parenchyma. - Resistive indices in the renal transplant are stable compared with prior examination. - Main renal artery/iliac artery: Patent - Main renal vein/iliac vein: Patent     BLADDER: Unremarkable.         No significant change compared with prior study. Grossly stable resistive indices in the renal transplant arteries, borderline at the main renal artery anastomosis.     Please see below for data measurements:     Transplant location: RLQ     Renal Transplant: Sagittal 12.8 cm; AP 5.8 cm; Transverse 5.7 cm     Segmental artery superior resistive index: 0.72 Segmental artery mid resistive index: 0.66 Segmental artery inferior resistive index: 0.61     Previous resistive indices range of segmental arteries: 0.62-0.69     Main renal artery peak systolic velocity at anastomosis: 1.21 m/s Main renal artery hilum resistive index: 0.67 Main renal artery mid resistive index: 0.72 Main renal artery anastomosis resistive index: 0.82     Previous resistive indices range of main renal artery: 0.74-0.78     Main renal vein: patent     Iliac artery: Patent Iliac vein: Patent     Bladder volume prevoid: 58.8  mL           Serologies:  Lab Results   Component Value Date    CMV IgG POSITIVE (AB) 05/18/2012    CMV IGG Positive (A) 03/09/2019    EBV IgG POSITIVE 05/18/2012    EBV VCA IgG Antibody Positive (A) 03/09/2019    Hepatitis A IgG Nonreactive 05/18/2012    Hep A IgG Reactive (A) 02/25/2018    Hep B Surface Ag Nonreactive 06/30/2019    Hepatitis B Surface Ag Negative 05/18/2012    Hep B S Ab Reactive (A) 12/20/2020    Hep B S Ab Reactive 05/18/2012    Hep B Surf Ab Quant 578.09 (H) 12/20/2020    Hepatitis C Ab Nonreactive 03/09/2019    Hepatitis C Ab Negative 05/18/2012    RPR Nonreactive 03/09/2019    RPR NON-REACTIVE 05/18/2012    HSV 1 IgG Positive (A) 03/09/2019    HSV 1 IgG POSITIVE 05/18/2012    HSV 2 IgG Negative 03/09/2019    HSV 2 IgG NEGATIVE 05/18/2012    Varicella IgG Positive 03/09/2019    Varicella IgG POSITIVE 05/18/2012    Rubella IgG Scr Positive (A) 02/25/2018    Rubella IgG Scr POSITIVE 05/18/2012    Toxoplasma Gondii IgG Negative 02/25/2018    Toxoplasma Gondii IgG NEGATIVE 05/18/2012    Quantiferon TB Gold Plus Interpretation Negative 02/25/2018    Quantiferon Mitogen Minus Nil 8.98 02/25/2018    Quantiferon Antigen 1 minus Nil -0.06 02/25/2018       Immunizations:  Immunization History   Administered Date(s) Administered    COVID-19 VAC,BIVALENT(58YR UP),PFIZER 07/04/2021    COVID-19 VACC,MRNA,(PFIZER)(PF) 11/29/2019, 05/17/2020    DTP 07/12/1989, 09/15/1989, 11/17/1989, 05/15/1991    DTaP 08/18/1994    HEPATITIS B VACCINE ADULT,IM(ENERGIX B, RECOMBIVAX) 01/21/2012, 01/21/2012, 02/16/2012, 02/16/2012, 03/17/2012, 03/17/2012, 07/14/2012, 07/14/2012    HiB, unspecified 07/12/1989, 09/15/1989, 11/17/1989, 05/15/1991, 04/29/2011    INFLUENZA TIV (TRI) 51MO+ W/ PRESERV (IM) 03/31/2011    Influenza Vaccine Quad (IIV4 PF) 15mo+ injectable 12/15/2018, 12/15/2018, 12/15/2018, 11/29/2019, 12/20/2020    Influenza Vaccine Quad (IIV4 W/PRESERV) 51MO+ 12/16/2017    MENINGOCOCCAL VACCINE, A,C,Y, W-135(IM)(MENVEO) 01/26/2020    MMR 05/15/1991, 08/18/1994    Meningococcal ACWY, Unspecified Formulation 04/29/2011, 01/26/2020    Meningococcal B Vaccine,  OMV Adjuvanted(Bexsero) 01/26/2020, 03/01/2020, 03/21/2020    Meningococcal C Conjugate 04/29/2011    Meningococcal Conjugate MCV4P 01/26/2020    OPV 07/12/1989, 09/15/1989, 05/15/1991, 08/18/1994    PNEUMOCOCCAL POLYSACCHARIDE 23 02/04/2012, 02/04/2012, 01/26/2020    PPD Test 05/01/2011    Pneumococcal Conjugate 13-Valent 02/23/2019, 02/23/2019    Pneumococcal, Unspecified Formulation 01/26/2020    TdaP 11/17/2007

## 2021-09-27 LAB — RENAL FUNCTION PANEL
ALBUMIN: 2.9 g/dL — ABNORMAL LOW (ref 3.4–5.0)
ALBUMIN: 2.8 g/dL — ABNORMAL LOW (ref 3.4–5.0)
ANION GAP: 16 mmol/L — ABNORMAL HIGH (ref 5–14)
ANION GAP: 13 mmol/L (ref 5–14)
BLOOD UREA NITROGEN: 90 mg/dL — ABNORMAL HIGH (ref 9–23)
BLOOD UREA NITROGEN: 98 mg/dL — ABNORMAL HIGH (ref 9–23)
BUN / CREAT RATIO: 19
BUN / CREAT RATIO: 23
CALCIUM: 9.3 mg/dL (ref 8.7–10.4)
CALCIUM: 9.5 mg/dL (ref 8.7–10.4)
CHLORIDE: 99 mmol/L (ref 98–107)
CHLORIDE: 104 mmol/L (ref 98–107)
CO2: 17 mmol/L — ABNORMAL LOW (ref 20.0–31.0)
CO2: 16 mmol/L — ABNORMAL LOW (ref 20.0–31.0)
CREATININE: 4.66 mg/dL — ABNORMAL HIGH
CREATININE: 4.32 mg/dL — ABNORMAL HIGH
EGFR CKD-EPI (2021) FEMALE: 12 mL/min/{1.73_m2} — ABNORMAL LOW (ref >=60)
EGFR CKD-EPI (2021) FEMALE: 13 mL/min/{1.73_m2} — ABNORMAL LOW (ref >=60)
GLUCOSE RANDOM: 108 mg/dL (ref 70–179)
GLUCOSE RANDOM: 114 mg/dL (ref 70–179)
PHOSPHORUS: 5.6 mg/dL — ABNORMAL HIGH (ref 2.4–5.1)
PHOSPHORUS: 6.1 mg/dL — ABNORMAL HIGH (ref 2.4–5.1)
POTASSIUM: 4.4 mmol/L (ref 3.4–4.8)
SODIUM: 132 mmol/L — ABNORMAL LOW (ref 135–145)
SODIUM: 133 mmol/L — ABNORMAL LOW (ref 135–145)

## 2021-09-27 LAB — CBC W/ AUTO DIFF
BASOPHILS ABSOLUTE COUNT: 0 10*9/L (ref 0.0–0.1)
BASOPHILS RELATIVE PERCENT: 0.6 %
EOSINOPHILS ABSOLUTE COUNT: 0 10*9/L (ref 0.0–0.5)
EOSINOPHILS RELATIVE PERCENT: 0.4 %
HEMATOCRIT: 28.5 % — ABNORMAL LOW (ref 34.0–44.0)
HEMOGLOBIN: 9.8 g/dL — ABNORMAL LOW (ref 11.3–14.9)
LYMPHOCYTES ABSOLUTE COUNT: 0.4 10*9/L — ABNORMAL LOW (ref 1.1–3.6)
LYMPHOCYTES RELATIVE PERCENT: 22.1 %
MEAN CORPUSCULAR HEMOGLOBIN CONC: 34.2 g/dL (ref 32.0–36.0)
MEAN CORPUSCULAR HEMOGLOBIN: 30.4 pg (ref 25.9–32.4)
MEAN CORPUSCULAR VOLUME: 88.8 fL (ref 77.6–95.7)
MEAN PLATELET VOLUME: 8 fL (ref 6.8–10.7)
MONOCYTES ABSOLUTE COUNT: 0.4 10*9/L (ref 0.3–0.8)
MONOCYTES RELATIVE PERCENT: 21.7 %
NEUTROPHILS ABSOLUTE COUNT: 1.1 10*9/L — ABNORMAL LOW (ref 1.8–7.8)
NEUTROPHILS RELATIVE PERCENT: 55.2 %
PLATELET COUNT: 273 10*9/L (ref 150–450)
RED BLOOD CELL COUNT: 3.22 10*12/L — ABNORMAL LOW (ref 3.95–5.13)
RED CELL DISTRIBUTION WIDTH: 13.9 % (ref 12.2–15.2)
WBC ADJUSTED: 2 10*9/L — ABNORMAL LOW (ref 3.6–11.2)

## 2021-09-27 LAB — CK
CREATINE KINASE TOTAL: 23 U/L — ABNORMAL LOW
CREATINE KINASE TOTAL: 26 U/L — ABNORMAL LOW

## 2021-09-27 LAB — BLOOD GAS CRITICAL CARE PANEL, VENOUS
BASE EXCESS VENOUS: -8.3 — ABNORMAL LOW (ref -2.0–2.0)
CALCIUM IONIZED VENOUS (MG/DL): 4.67 mg/dL (ref 4.40–5.40)
GLUCOSE WHOLE BLOOD: 105 mg/dL (ref 70–179)
HCO3 VENOUS: 16 mmol/L — ABNORMAL LOW (ref 22–27)
HEMOGLOBIN BLOOD GAS: 10 g/dL — ABNORMAL LOW
LACTATE BLOOD VENOUS: 0.9 mmol/L (ref 0.5–1.8)
O2 SATURATION VENOUS: 80.8 % (ref 40.0–85.0)
PCO2 VENOUS: 31 mmHg — ABNORMAL LOW (ref 40–60)
PH VENOUS: 7.35 (ref 7.32–7.43)
PO2 VENOUS: 53 mmHg (ref 30–55)
POTASSIUM WHOLE BLOOD: 4.4 mmol/L (ref 3.4–4.6)
SODIUM WHOLE BLOOD: 124 mmol/L — ABNORMAL LOW (ref 135–145)

## 2021-09-27 LAB — MAGNESIUM: MAGNESIUM: 2.3 mg/dL (ref 1.6–2.6)

## 2021-09-27 LAB — CMV DNA, QUANTITATIVE, PCR: CMV VIRAL LD: NOT DETECTED

## 2021-09-27 LAB — SLIDE REVIEW

## 2021-09-27 LAB — VANCOMYCIN, RANDOM: VANCOMYCIN RANDOM: 19.7 ug/mL

## 2021-09-27 LAB — TACROLIMUS LEVEL, TROUGH: TACROLIMUS, TROUGH: 36 ng/mL (ref 5.0–15.0)

## 2021-09-27 MED ADMIN — clonazePAM (KlonoPIN) tablet 0.5 mg: .5 mg | ORAL | @ 01:00:00

## 2021-09-27 MED ADMIN — tacrolimus (ENVARSUS XR) extended release tablet 7 mg: 7 mg | ORAL | @ 01:00:00

## 2021-09-27 MED ADMIN — levETIRAcetam (KEPPRA) tablet 250 mg: 250 mg | ORAL | @ 12:00:00

## 2021-09-27 MED ADMIN — predniSONE (DELTASONE) tablet 5 mg: 5 mg | ORAL | @ 01:00:00

## 2021-09-27 MED ADMIN — heparin (porcine) 5,000 unit/mL injection 5,000 Units: 5000 [IU] | SUBCUTANEOUS | @ 17:00:00

## 2021-09-27 MED ADMIN — levETIRAcetam (KEPPRA) tablet 250 mg: 250 mg | ORAL | @ 01:00:00

## 2021-09-27 MED ADMIN — tacrolimus (ENVARSUS XR) extended release tablet 7 mg: 7 mg | ORAL | @ 12:00:00

## 2021-09-27 MED ADMIN — DAPTOmycin (CUBICIN) 400 mg in sodium chloride (NS) 0.9 % 50 mL IVPB: 400 mg | INTRAVENOUS | @ 01:00:00 | Stop: 2021-10-02

## 2021-09-27 MED ADMIN — heparin (porcine) 5,000 unit/mL injection 5,000 Units: 5000 [IU] | SUBCUTANEOUS | @ 01:00:00

## 2021-09-27 MED ADMIN — piperacillin-tazobactam (ZOSYN) IVPB (premix) 2.25 g: 2.25 g | INTRAVENOUS | @ 02:00:00 | Stop: 2021-10-01

## 2021-09-27 MED ADMIN — piperacillin-tazobactam (ZOSYN) IVPB (premix) 2.25 g: 2.25 g | INTRAVENOUS | @ 21:00:00 | Stop: 2021-10-01

## 2021-09-27 MED ADMIN — sodium bicarbonate tablet 650 mg: 650 mg | ORAL | @ 01:00:00

## 2021-09-27 MED ADMIN — lactated Ringers infusion: 150 mL/h | INTRAVENOUS | @ 12:00:00 | Stop: 2021-09-27

## 2021-09-27 MED ADMIN — piperacillin-tazobactam (ZOSYN) IVPB (premix) 2.25 g: 2.25 g | INTRAVENOUS | @ 14:00:00 | Stop: 2021-10-01

## 2021-09-27 MED ADMIN — sodium bicarbonate tablet 650 mg: 650 mg | ORAL | @ 12:00:00

## 2021-09-27 MED ADMIN — heparin (porcine) 5,000 unit/mL injection 5,000 Units: 5000 [IU] | SUBCUTANEOUS | @ 10:00:00

## 2021-09-27 MED ADMIN — cholecalciferol (vitamin D3-125 mcg (5,000 unit)) tablet 125 mcg: 125 ug | ORAL | @ 12:00:00

## 2021-09-27 MED ADMIN — mycophenolate (MYFORTIC) EC tablet 360 mg: 360 mg | ORAL | @ 21:00:00

## 2021-09-27 MED ADMIN — clonazePAM (KlonoPIN) tablet 0.5 mg: .5 mg | ORAL | @ 12:00:00

## 2021-09-27 MED ADMIN — piperacillin-tazobactam (ZOSYN) IVPB (premix) 2.25 g: 2.25 g | INTRAVENOUS | @ 08:00:00 | Stop: 2021-10-01

## 2021-09-27 MED ADMIN — predniSONE (DELTASONE) tablet 5 mg: 5 mg | ORAL | @ 12:00:00

## 2021-09-27 NOTE — Unmapped (Signed)
IMMUNOCOMPROMISED HOST INFECTIOUS DISEASE PROGRESS NOTE        Assessment/Plan:     Carolyn Kelley is a 31 y.o. 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 ID consulted for sepsis of unclear etiology.       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  - Serologies: CMV D+/R+, EBV D+/R+, Toxo D?/R-  - Induction: Campath  - Surgical complications: hypotension and hypoxia thought to be caused by an anaphylactic reaction to Campath  - Prophylaxis prior to admission: None  - History of rejection if any: None  - Immunosuppression: Myfortic 720 mg bid (increased due to C3 glomerulonephropathy recurrence), prednisone 5 mg daily (restarted due to C3 glomerulonephropathy recurrence), Envarsus 7 mg daily (target trough is 6-10 ng/mL)     Pertinent comorbidities:  Seizure disorder  Pseudoaneurysm of AVF   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     Pertinent exposures:  Recent travel to [place] (6/15-6/29)     Summary of pertinent prior infections:  None     Active infection:  #Sepsis 2/2 E coli UTI, 09/26/21  #Diarrhea, 09/20/21  - History of recent travel to [place], and presented on 7/7 with ~1 week of fatigue, dysuria, diarrhea (resolved), and lower abdominal pain, found to be hypotensive and tachycardic  - 7/7 CXR without acute abnormalities, RPP negative, UA with squams  - 7/7 Ucx x2, 100 k E coli, 50-100k E coli w/ 10 - 50 k UGF  - 7/7  CMV, BK pending  - 7/8 HIV, RPR pending     Antimicrobial allergy/intolerance:  Cefepime - facial rash           RECOMMENDATIONS    Diagnosis  SEND C diff, GIPP,   FU serum CMV, BK virus  Follow up 7/7 BCx  Follow up 7/7 Ucx (for susceptibilities)  FU HIV, RPR, urine gonorrhea, urine chlamydia       Management  Continue piperacillin-tazobactam 4.5g q6h, renal dosing per pharmacy  Continue daptomycin 8 mg/kg daily, renal dosing per pharmacy (can dc when blood cultures are negative at 48 h)    Antimicrobial prophylaxis required for transplant immunosuppression   none    Intensive toxicity monitoring for prescription antimicrobials   CBC w/diff at least once per week  CMP at least once per week  creatine kinase at least once per week  clinical assessments for myopathy (daptomycin)  clinical assessments for rashes or other skin changes    The [facility] ID service will continue to follow.           Please page the ID Transplant/Liquid Oncology Fellow consult at [phone] with questions.  Patient discussed with Dr. Julaine Hua.    Clista Bernhardt, MD  [facility] Infectious [facility]    Subjective:     External record(s): Primary team note: concerned for urosepsis .    Independent historian(s): no independent historian required.       Interval History:     Medications:  Current Medications as of 09/27/2021  Scheduled  PRN   cholecalciferol (vitamin D3-125 mcg (5,000 unit)), 125 mcg, Daily  clonazePAM, 0.5 mg, BID  [START ON 09/28/2021] DAPTOmycin, 550 mg, Q48H  heparin (porcine) for subcutaneous use, 5,000 Units, Q8H SCH  levETIRAcetam, 250 mg, BID  piperacillin-tazobactam (ZOSYN) IV (intermittent), 2.25 g, Q6H SCH  predniSONE, 5 mg, Daily  sodium bicarbonate, 650 mg, BID  tacrolimus, 7 mg, Daily      acetaminophen, 650 mg, Q6H PRN  melatonin, 3 mg, Nightly PRN  ondansetron, 4 mg, Q8H PRN   Or  ondansetron, 4 mg, Q8H PRN         Objective:     Vital Signs last 24 hours:  Temp:  [36.5 ??C (97.7 ??F)-37.3 ??C (99.2 ??F)] 36.8 ??C (98.2 ??F)  Heart Rate:  [77-107] 84  SpO2 Pulse:  [78-93] 84  Resp:  [13-47] 33  BP: (117-137)/(76-93) 125/76  MAP (mmHg):  [93-106] 93  SpO2:  [82 %-100 %] 97 %  BMI (Calculated):  [28.18] 28.18    Physical Exam:   Patient Lines/Drains/Airways Status       Active Active Lines, Drains, & Airways       Name Placement date Placement time Site Days Peripheral IV 09/26/21 Right Antecubital 09/26/21  1045  Antecubital  1    Peripheral IV 09/26/21 Anterior;Right Forearm 09/26/21  1045  Forearm  1    Arteriovenous Fistula - Vein Graft  Access Arteriovenous fistula Left;Upper Arm --  --  Arm  --                  Const [x]  vital signs above    []  NAD, non-toxic appearance []  Chronically ill-appearing, non-distressed  Chronically ill appearing, still fatigued      Eyes []  Lids normal bilaterally, conjunctiva anicteric and noninjected OU     [] PERRL  [] EOMI        ENMT [x]  Normal appearance of external nose and ears, no nasal discharge        []  MMM, no lesions on lips or gums []  No thrush, leukoplakia, oral lesions  []  Dentition good []  Edentulous []  Dental caries present  []  Hearing normal  []  TMs with good light reflexes bilaterally   Dry mucous membranes       Neck []  Neck of normal appearance and trachea midline        []  No thyromegaly, nodules, or tenderness   []  Full neck ROM        Lymph [x]  No LAD in neck     []  No LAD in supraclavicular area     []  No LAD in axillae   []  No LAD in epitrochlear chains     []  No LAD in inguinal areas        CV [x]  RRR            []  No peripheral edema     []  Pedal pulses intact   []  No abnormal heart sounds appreciated   []  Extremities WWP   C      Resp [x]  Normal WOB at rest    []  No breathlessness with speaking, no coughing  []  CTA anteriorly    [x]  CTA posteriorly          GI []  Normal inspection, NTND   []  NABS     []  No umbilical hernia on exam       []  No hepatosplenomegaly     []  Inspection of perineal and perianal areas normal        GU []  Normal external genitalia     [] No urinary catheter present in urethra   []  No CVA tenderness    []  No tenderness over renal allograft  Mild suprapubic TTP      MSK [x]  No clubbing or cyanosis of hands       []  No  vertebral point tenderness  []  No focal tenderness or abnormalities on palpation of joints in RUE, LUE, RLE, or LLE        Skin []  No rashes, lesions, or ulcers of visualized skin     []  Skin warm and dry to palpation         Neuro [x]  Face expression symmetric  []  Sensation to light touch grossly intact throughout    []  Moves extremities equally    []  No tremor noted        []  CNs II-XII grossly intact     []  DTRs normal and symmetric throughout []  Gait unremarkable        Psych []  Appropriate affect       []  Fluent speech         []  Attentive, good eye contact  [x]  Oriented to person, place, time          []  Judgment and insight are appropriate   Flat affect        Data for Medical Decision Making     09/26/21) EKG QTcF 411 ms     I discussed not done.    I reviewed  wbc 2.0 from 4.3, hgb 9.8 (9.4), ANC 1.1 Na 133, CO2 16, Cr 4.31 (4.66) RPP -  .    I independently visualized/interpreted not done.       Recent Labs   Lab Units 09/27/21  0550 09/26/21  1721 09/26/21  1056   WBC 10*9/L 2.0*  --  4.3   HEMOGLOBIN g/dL 9.8*  --  9.4*   PLATELET COUNT (1) 10*9/L 273  --  >258   NEUTRO ABS 10*9/L 1.1*  --  1.9   LYMPHO ABS 10*9/L 0.4*  --   --    EOSINO ABS 10*9/L 0.0  --   --    BUN mg/dL 98*   < > 096*   CREATININE mg/dL 0.45*   < > 4.09*   AST U/L  --   --  33   ALT U/L  --   --  87*   BILIRUBIN TOTAL mg/dL  --   --  0.5   ALK PHOS U/L  --   --  73   POTASSIUM WHOLE BLOOD   --    < > 3.9   POTASSIUM mmol/L 4.4   < > 3.8   MAGNESIUM mg/dL 2.3  --  2.6   PHOSPHORUS mg/dL 6.1*   < >  --    CALCIUM mg/dL 9.5   < > 9.7   CK TOTAL U/L 26.0*  --   --     < > = values in this interval not displayed.       Microbiology:  Microbiology Results (last day)       Procedure Component Value Date/Time Date/Time    Chlamydia/Gonorrhoeae NAA [[id number]]     Lab Status: No result Specimen: Urine     Urine Culture [[id number]] Collected: 09/26/21 2210    Lab Status: In process Specimen: Urine from Clean Catch Updated: 09/26/21 2224    GI Pathogen Panel [[id number]]     Lab Status: No result Specimen: Stool      C. Difficile Assay [[id number]]     Lab Status: No result Specimen: Stool      Urine Culture [[id number]] Collected: 09/26/21 1316    Lab Status: In process Specimen: Urine from Clean Catch Updated: 09/26/21 1407    Respiratory Pathogen Panel with COVID-19 [[id number]]  (Normal) Collected: 09/26/21 1058  Lab Status: Final result Specimen: Nasopharyngeal Swab Updated: 09/26/21 1241     Adenovirus Not Detected     Coronavirus HKU1 Not Detected     Coronavirus NL63 Not Detected     Coronavirus 229E Not Detected     Coronavirus OC43 PCR Not Detected     Metapneumovirus Not Detected     Rhinovirus/Enterovirus Not Detected     Influenza A Not Detected     Influenza B Not Detected     Parainfluenza 1 Not Detected     Parainfluenza 2 Not Detected     Parainfluenza 3 Not Detected     Parainfluenza 4 Not Detected     RSV Not Detected     Bordetella pertussis Not Detected     Comment: If B. pertussis/parapertussis infection is suspected, the Bordetella pertussis/parapertussis Qualitative PCR test should be ordered.        Bordetella parapertussis Not Detected     Chlamydophila (Chlamydia) pneumoniae Not Detected     Mycoplasma pneumoniae Not Detected     SARS-CoV-2 PCR Not Detected    Narrative:      This result was obtained using the FDA-cleared [employer] Respiratory 2.1 Panel. Performance characteristics have been established and verified by the Clinical Molecular Microbiology Laboratory, [facility]. This assay does not distinguish between rhinovirus and enterovirus. Lower respiratory specimens will not be tested for Bordetella pertussis/parapertussis. For nasopharyngeal swabs, cross-reactivity may occur between B. pertussis and non-pertussis Bordetella species. All positive B. pertussis results will be automatically confirmed using our in-house PCR assay. Ct (cycle threshold) values for SARS-CoV-2 are not available for this test.    Blood Culture [[id number]] Collected: 09/26/21 1058    Lab Status: In process Specimen: Blood from 1 Peripheral Draw Updated: 09/26/21 1207    Blood Culture [[id number]] Collected: 09/26/21 1120    Lab Status: In process Specimen: Blood from 1 Peripheral Draw Updated: 09/26/21 1200            Imaging:  ECG 12 Lead    Result Date: 09/26/2021  NORMAL SINUS RHYTHM NORMAL ECG WHEN COMPARED WITH ECG OF 10-Mar-2019 05:20, NO SIGNIFICANT CHANGE WAS FOUND Confirmed by Christella Noa ([id number]) on 09/26/2021 11:14:34 AM    XR Chest Portable    Result Date: 09/26/2021  EXAM: XR CHEST PORTABLE DATE: 09/26/2021 11:02 AM ACCESSION: [id number] UN DICTATED: 09/26/2021 11:14 AM INTERPRETATION LOCATION: [facility]     CLINICAL INDICATION: 31 years old Female with FEVER      TECHNIQUE: Single View AP Chest Radiograph.     COMPARISON: Chest 03/29/2020     FINDINGS:     Low lung volumes. Lungs are clear. No pleural effusion or pneumothorax.     Unremarkable cardiomediastinal silhouette.             No acute abnormalities.    US Renal Transplant W Doppler    Result Date: 09/26/2021  EXAM: US RENAL TRANSPLANT W DOPPLER DATE: 09/26/2021 ACCESSION: [id number] UN DICTATED: 09/26/2021 11:46 AM INTERPRETATION LOCATION: [facility]     CLINICAL INDICATION: 31 years old Female with kidney transplant     COMPARISON: Renal transplant Doppler 04/26/2020     TECHNIQUE:  Ultrasound views of the renal transplant were obtained using gray scale and color and spectral Doppler imaging. Views of the urinary bladder were obtained using gray scale and limited color Doppler imaging.     FINDINGS:     TRANSPLANTED KIDNEY: The renal transplant was located in the right lower quadrant. Normal size and echogenicity.  No  solid masses or calculi. No perinephric collections identified. No hydronephrosis.     VESSELS: - Perfusion: Using power Doppler, normal perfusion was seen throughout the renal parenchyma. - Resistive indices in the renal transplant are stable compared with prior examination. - Main renal artery/iliac artery: Patent - Main renal vein/iliac vein: Patent     BLADDER: Unremarkable.         No significant change compared with prior study. Grossly stable resistive indices in the renal transplant arteries, borderline at the main renal artery anastomosis.     Please see below for data measurements:     Transplant location: RLQ     Renal Transplant: Sagittal 12.8 cm; AP 5.8 cm; Transverse 5.7 cm     Segmental artery superior resistive index: 0.72 Segmental artery mid resistive index: 0.66 Segmental artery inferior resistive index: 0.61     Previous resistive indices range of segmental arteries: 0.62-0.69     Main renal artery peak systolic velocity at anastomosis: 1.21 m/s Main renal artery hilum resistive index: 0.67 Main renal artery mid resistive index: 0.72 Main renal artery anastomosis resistive index: 0.82     Previous resistive indices range of main renal artery: 0.74-0.78     Main renal vein: patent     Iliac artery: Patent Iliac vein: Patent     Bladder volume prevoid: 58.8  mL

## 2021-09-27 NOTE — Unmapped (Signed)
Pt is A&Ox4, delayed responses, VSS, O2 sats >90% on RA. Afebrile. No c/o pain. UO adequate, pt still complaining of urinary discomfort, no BM this shift. Pt's appetite has been adequate. Skin intact, pt self turns and enteric precautions maintained for cdiff rule out, no stool sample collected at this time. No falls/injuries this shift. On SQ heparin for DVT prophylaxis. Family at bedside. All monitors with appropriate alarm settings, call bell within reach, see flowsheets/MAR for further info.      Problem: Adult Inpatient Plan of Care  Goal: Plan of Care Review  Outcome: Progressing  Goal: Patient-Specific Goal (Individualized)  Outcome: Progressing  Goal: Absence of Hospital-Acquired Illness or Injury  Outcome: Progressing  Intervention: Identify and Manage Fall Risk  Recent Flowsheet Documentation  Taken 09/27/2021 0800 by Isac Caddy, RN  Safety Interventions:   bleeding precautions   assistive device   commode/urinal/bedpan at bedside   environmental modification   fall reduction program maintained   infection management   lighting adjusted for tasks/safety   isolation precautions   low bed  Intervention: Prevent and Manage VTE (Venous Thromboembolism) Risk  Recent Flowsheet Documentation  Taken 09/27/2021 1600 by Isac Caddy, RN  Activity Management:   activity adjusted per tolerance   activity encouraged  Taken 09/27/2021 1400 by Isac Caddy, RN  Activity Management:   activity adjusted per tolerance   activity encouraged  Taken 09/27/2021 1200 by Isac Caddy, RN  Activity Management:   activity adjusted per tolerance   activity encouraged  Taken 09/27/2021 1000 by Isac Caddy, RN  Activity Management:   activity adjusted per tolerance   activity encouraged  Taken 09/27/2021 0800 by Isac Caddy, RN  Activity Management:   activity adjusted per tolerance   activity encouraged   ambulated to bathroom  Intervention: Prevent Infection  Recent Flowsheet Documentation  Taken 09/27/2021 0800 by Isac Caddy, RN  Infection Prevention:   environmental surveillance performed   equipment surfaces disinfected   hand hygiene promoted   personal protective equipment utilized   single patient room provided   rest/sleep promoted  Goal: Optimal Comfort and Wellbeing  Outcome: Progressing  Goal: Readiness for Transition of Care  Outcome: Progressing  Goal: Rounds/Family Conference  Outcome: Progressing     Problem: Impaired Wound Healing  Goal: Optimal Wound Healing  Outcome: Progressing  Intervention: Promote Wound Healing  Recent Flowsheet Documentation  Taken 09/27/2021 1600 by Isac Caddy, RN  Activity Management:   activity adjusted per tolerance   activity encouraged  Taken 09/27/2021 1400 by Isac Caddy, RN  Activity Management:   activity adjusted per tolerance   activity encouraged  Taken 09/27/2021 1200 by Isac Caddy, RN  Activity Management:   activity adjusted per tolerance   activity encouraged  Taken 09/27/2021 1000 by Isac Caddy, RN  Activity Management:   activity adjusted per tolerance   activity encouraged  Taken 09/27/2021 0800 by Isac Caddy, RN  Activity Management:   activity adjusted per tolerance   activity encouraged   ambulated to bathroom     Problem: Infection  Goal: Absence of Infection Signs and Symptoms  Outcome: Progressing  Intervention: Prevent or Manage Infection  Recent Flowsheet Documentation  Taken 09/27/2021 0800 by Isac Caddy, RN  Infection Management: aseptic technique maintained  Isolation Precautions: enteric precautions maintained     Problem: Self-Care Deficit  Goal: Improved Ability to Complete Activities of Daily Living  Outcome: Progressing  Problem: Fall Injury Risk  Goal: Absence of Fall and Fall-Related Injury  Outcome: Progressing  Intervention: Promote Injury-Free Environment  Recent Flowsheet Documentation  Taken 09/27/2021 0800 by Isac Caddy, RN  Safety Interventions:   bleeding precautions   assistive device   commode/urinal/bedpan at bedside environmental modification   fall reduction program maintained   infection management   lighting adjusted for tasks/safety   isolation precautions   low bed

## 2021-09-27 NOTE — Unmapped (Signed)
Nephrology ([facility]) Progress Note    Assessment & Plan:   Carolyn Kelley is a 31 y.o. 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     Principal Problem:    Acute kidney injury (CMS-HCC)  Active Problems:    UTI (urinary tract infection)    History of immunosuppression therapy    History of international travel  Resolved Problems:    * No resolved hospital problems. *      Active Problems     Acute kidney injury - Febrile illness - UTI with possible urosepsis - Metabolic acidosis   Urine microscopy notable for bacteria otherwise no significant findings. Given patient's persistent diarrhea causing dehydration and hypotension in clinic, etiology of AKI likely prerenal with concomitant infection. UA today reveals hematuria and pyuria - likely source of infection UTI with urosepsis. Would also consider disseminated gonococcal infection. Persistent diarrhea also c/f C diff given past history of this problem. Patient has acidosis of 16.0 at ED, although noted to improve with home bicarbonate. Will need to closely assess if renal status improves and restart immunosuppression if suspected prerenal etiology as there exists the question of whether patient was taking immunosuppressive medications during her trip. If AKI not improving then need to consider kidney biopsy.   - Follow-up urine and blood cultures  - Follow-up CMV DNA PCR, C diff assay, HIV, GC NAA urine, GI pathogen panel  - Replete fluids: LR 161mL/hr over 10 hours  - Daily labs  - Daptomcyin and pip/tazo. Avoid cephalosporins  - Check CK weekly     S/P deceased donor kidney transplant with C3 glomerulopathy recurrence - Immunosuppression  Kidney biopsy 12/29/19 confirmed presence of C3 glomerulopathy recurrence. It is possible she has a flare of C3 glomerulopathy or rejection though these seem less likely. Informed by Dr. Clelia Croft patient did not take Myfortic while in [place], therefore low threshold to restart if AKI seems due to dehydration and infection and is improving tomorrow. Tacrolimus level noted to be <1.0 7/7 morning. Also Consider kidney biopsy if suspicion increases.   - Restarted home Myfortic at half dose (360mg  BID); home dose is 720mg  BID  - Trend Envarsus levels. Level today not reflective of true dose  - Hold aspirin   - Continue home Envarsus. Consider dose reduction if renal function worsens  - Continue home prednisone     Chronic Problems     Seizures Disorder, no seizures since 2014:    EEG with no evidence of seizures on 06/25/20. Keppra taper is being initiated by Neurology team. She will follow up with [name], NP at [facility].        Myoclonus:   - Continue home Klonopin    Issues Impacting Complexity of Management:  -Intensive monitoring of drug toxicity from Tacrolimus with scheduled levels    Daily Checklist:  Diet: Regular Diet  DVT PPx: Heparin 5000units q8h  Electrolytes: Replete Potassium to >/= 3.6 and Magnesium to >/= 1.8  Code Status: Full Code  Dispo: Home    Team Contact Information:   Primary Team: Nephrology ([facility])  Primary Resident: Jeannetta Nap, MD  Resident's Pager: [phone] (Nephrology Intern - Alvester Morin)    Interval History:   No acute events overnight. Patient feels significantly better today. Still endorses dysuria, weakness, and chills but improved from prior. Lying comfortably  in bed. Able to tolerate PO.    ROS: Denies headache, chest pain, shortness of breath, abdominal pain, nausea, vomiting.    Objective:   Temp:  [36.5 ??C (97.7 ??F)-37 ??C (98.6 ??F)] 36.6 ??C (97.9 ??F)  Heart Rate:  [77-103] 85  SpO2 Pulse:  [78-93] 86  Resp:  [16-33] 17  BP: (117-137)/(65-93) 118/65  SpO2:  [95 %-100 %] 97 %,   Intake/Output Summary (Last 24 hours) at 09/27/2021 1646  Last data filed at 09/27/2021 1545  Gross per 24 hour   Intake 2895 ml   Output 400 ml   Net 2495 ml       Gen: NAD, converses   HENT: atraumatic, normocephalic  Heart: RRR  Lungs: CTAB, no crackles or wheezes  Abdomen: soft, NTND. No CVA tenderness  Extremities: No edema

## 2021-09-27 NOTE — Unmapped (Signed)
Tacrolimus Therapeutic Monitoring Pharmacy Note    Carolyn Kelley is a 31 y.o. female continuing tacrolimus.     Indication: Kidney transplant     Date of Transplant:  02/2019       Prior Dosing Information: Home regimen Envarsus 7 mg daily      Goals:  Therapeutic Drug Levels  Tacrolimus trough goal:  6-10 ng/mL    Additional Clinical Monitoring/Outcomes  Monitor renal function (SCr and urine output) and liver function (LFTs)  Monitor for signs/symptoms of adverse events (e.g., hyperglycemia, hyperkalemia, hypomagnesemia, hypertension, headache, tremor)    Results:   Tacrolimus level:  <1 ng/mL, drawn appropriately    Pharmacokinetic Considerations and Significant Drug Interactions:  Concurrent hepatotoxic medications: None identified  Concurrent CYP3A4 substrates/inhibitors: None identified  Concurrent nephrotoxic medications: None identified    Assessment/Plan:  Recommendedation(s)  Continue current regimen of Envarsus 7 mg daily    Follow-up  Next level should be ordered on 07/08 at 0600 .   A pharmacist will continue to monitor and recommend levels as appropriate    Please page service pharmacist with questions/clarifications.    Kennis Carina, PharmD

## 2021-09-27 NOTE — Unmapped (Signed)
Tacrolimus Therapeutic Monitoring Pharmacy Note    Carolyn Kelley is a 31 y.o. female continuing tacrolimus.     Indication: Kidney transplant     Date of Transplant:  02/2019       Prior Dosing Information: Home regimen Envarsus 7 mg daily      Goals:  Therapeutic Drug Levels  Tacrolimus trough goal:  6-10 ng/mL    Additional Clinical Monitoring/Outcomes  Monitor renal function (SCr and urine output) and liver function (LFTs)  Monitor for signs/symptoms of adverse events (e.g., hyperglycemia, hyperkalemia, hypomagnesemia, hypertension, headache, tremor)    Results:   Tacrolimus level:  36 ng/mL, drawn inaccurately as evening dose was given @2041  (~9 hour level rather than 24 hour level)  Level yesterday was <1    Pharmacokinetic Considerations and Significant Drug Interactions:  Concurrent hepatotoxic medications: None identified  Concurrent CYP3A4 substrates/inhibitors: None identified  Concurrent nephrotoxic medications: None identified    Assessment/Plan:  Recommendedation(s)  Continue current regimen of Envarsus 7 mg daily    Follow-up  Daily tac levels ordered at this time .   A pharmacist will continue to monitor and recommend levels as appropriate    Please page service pharmacist with questions/clarifications.    Vertis Kelch, PharmD

## 2021-09-28 LAB — CBC W/ AUTO DIFF
HEMATOCRIT: 28.8 % — ABNORMAL LOW (ref 34.0–44.0)
HEMOGLOBIN: 9.8 g/dL — ABNORMAL LOW (ref 11.3–14.9)
MEAN CORPUSCULAR HEMOGLOBIN CONC: 34.2 g/dL (ref 32.0–36.0)
MEAN CORPUSCULAR HEMOGLOBIN: 30.1 pg (ref 25.9–32.4)
MEAN CORPUSCULAR VOLUME: 88.1 fL (ref 77.6–95.7)
MEAN PLATELET VOLUME: 7.8 fL (ref 6.8–10.7)
PLATELET COUNT: 322 10*9/L (ref 150–450)
RED BLOOD CELL COUNT: 3.27 10*12/L — ABNORMAL LOW (ref 3.95–5.13)
RED CELL DISTRIBUTION WIDTH: 13.8 % (ref 12.2–15.2)
WBC ADJUSTED: 2.1 10*9/L — ABNORMAL LOW (ref 3.6–11.2)

## 2021-09-28 LAB — RENAL FUNCTION PANEL
ALBUMIN: 2.7 g/dL — ABNORMAL LOW (ref 3.4–5.0)
ANION GAP: 11 mmol/L (ref 5–14)
BLOOD UREA NITROGEN: 82 mg/dL — ABNORMAL HIGH (ref 9–23)
BUN / CREAT RATIO: 24
CALCIUM: 9.6 mg/dL (ref 8.7–10.4)
CHLORIDE: 110 mmol/L — ABNORMAL HIGH (ref 98–107)
CO2: 19 mmol/L — ABNORMAL LOW (ref 20.0–31.0)
CREATININE: 3.39 mg/dL — ABNORMAL HIGH
EGFR CKD-EPI (2021) FEMALE: 18 mL/min/{1.73_m2} — ABNORMAL LOW (ref >=60)
GLUCOSE RANDOM: 141 mg/dL (ref 70–179)
PHOSPHORUS: 3.8 mg/dL (ref 2.4–5.1)
POTASSIUM: 4.6 mmol/L (ref 3.4–4.8)
SODIUM: 140 mmol/L (ref 135–145)

## 2021-09-28 LAB — TACROLIMUS LEVEL, TROUGH: TACROLIMUS, TROUGH: 18.7 ng/mL — ABNORMAL HIGH (ref 5.0–15.0)

## 2021-09-28 LAB — MANUAL DIFFERENTIAL
BASOPHILS - ABS (DIFF): 0 10*9/L (ref 0.0–0.1)
BASOPHILS - REL (DIFF): 1 %
EOSINOPHILS - ABS (DIFF): 0 10*9/L (ref 0.0–0.5)
EOSINOPHILS - REL (DIFF): 1 %
LYMPHOCYTES - ABS (DIFF): 0.9 10*9/L — ABNORMAL LOW (ref 1.1–3.6)
LYMPHOCYTES - REL (DIFF): 45 %
MONOCYTES - ABS (DIFF): 0.4 10*9/L (ref 0.3–0.8)
MONOCYTES - REL (DIFF): 18 %
NEUTROPHILS - ABS (DIFF): 0.7 10*9/L — ABNORMAL LOW (ref 1.8–7.8)
NEUTROPHILS - REL (DIFF): 35 %

## 2021-09-28 LAB — MAGNESIUM: MAGNESIUM: 2.2 mg/dL (ref 1.6–2.6)

## 2021-09-28 MED ADMIN — sodium bicarbonate tablet 650 mg: 650 mg | ORAL | @ 02:00:00

## 2021-09-28 MED ADMIN — levETIRAcetam (KEPPRA) tablet 250 mg: 250 mg | ORAL | @ 02:00:00

## 2021-09-28 MED ADMIN — sodium bicarbonate tablet 650 mg: 650 mg | ORAL | @ 13:00:00

## 2021-09-28 MED ADMIN — mycophenolate (MYFORTIC) EC tablet 360 mg: 360 mg | ORAL | @ 13:00:00

## 2021-09-28 MED ADMIN — piperacillin-tazobactam (ZOSYN) IVPB (premix) 2.25 g: 2.25 g | INTRAVENOUS | @ 13:00:00 | Stop: 2021-09-28

## 2021-09-28 MED ADMIN — cholecalciferol (vitamin D3 25 mcg (1,000 units)) tablet 125 mcg: 125 ug | ORAL | @ 13:00:00

## 2021-09-28 MED ADMIN — predniSONE (DELTASONE) tablet 5 mg: 5 mg | ORAL | @ 13:00:00

## 2021-09-28 MED ADMIN — heparin (porcine) 5,000 unit/mL injection 5,000 Units: 5000 [IU] | SUBCUTANEOUS | @ 10:00:00

## 2021-09-28 MED ADMIN — clonazePAM (KlonoPIN) tablet 0.5 mg: .5 mg | ORAL | @ 13:00:00

## 2021-09-28 MED ADMIN — piperacillin-tazobactam (ZOSYN) IVPB (premix) 2.25 g: 2.25 g | INTRAVENOUS | @ 08:00:00 | Stop: 2021-09-28

## 2021-09-28 MED ADMIN — levETIRAcetam (KEPPRA) tablet 250 mg: 250 mg | ORAL | @ 13:00:00

## 2021-09-28 MED ADMIN — clonazePAM (KlonoPIN) tablet 0.5 mg: .5 mg | ORAL | @ 02:00:00

## 2021-09-28 MED ADMIN — piperacillin-tazobactam (ZOSYN) IVPB (premix) 2.25 g: 2.25 g | INTRAVENOUS | @ 02:00:00 | Stop: 2021-10-01

## 2021-09-28 MED ADMIN — tacrolimus (ENVARSUS XR) extended release tablet 7 mg: 7 mg | ORAL | @ 13:00:00 | Stop: 2021-09-28

## 2021-09-28 MED ADMIN — heparin (porcine) 5,000 unit/mL injection 5,000 Units: 5000 [IU] | SUBCUTANEOUS | @ 18:00:00

## 2021-09-28 MED ADMIN — ciprofloxacin HCl (CIPRO) tablet 500 mg: 500 mg | ORAL | @ 18:00:00 | Stop: 2021-10-09

## 2021-09-28 MED ADMIN — heparin (porcine) 5,000 unit/mL injection 5,000 Units: 5000 [IU] | SUBCUTANEOUS | @ 02:00:00

## 2021-09-28 NOTE — Unmapped (Signed)
IMMUNOCOMPROMISED HOST INFECTIOUS DISEASE PROGRESS NOTE        Assessment/Plan:     Carolyn Kelley is a 31 y.o. 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  - Serologies: CMV D+/R+, EBV D+/R+, Toxo D?/R-  - Induction: Campath  - Surgical complications: hypotension and hypoxia thought to be caused by an anaphylactic reaction to Campath  - Prophylaxis prior to admission: None  - History of rejection if any: None  - Immunosuppression: Myfortic 720 mg bid (increased due to C3 glomerulonephropathy recurrence), prednisone 5 mg daily (restarted due to C3 glomerulonephropathy recurrence), Envarsus 7 mg daily (target trough is 6-10 ng/mL)     Pertinent comorbidities:  Seizure disorder  Pseudoaneurysm of AVF   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     Pertinent exposures:  Recent travel to [place] (6/15-6/29)     Summary of pertinent prior infections:  None     Active infection:  #Sepsis 2/2 E coli UTI, 09/26/21  #Diarrhea, 09/20/21  - History of recent travel to [place], and presented on 7/7 with ~1 week of fatigue, dysuria, diarrhea (resolved), and lower abdominal pain, found to be hypotensive and tachycardic  - 7/7 CXR without acute abnormalities, RPP negative, UA with squams  - 7/7 Ucx x2, 100 k E coli (Pan-susceptible), 50-100k E coli w/ 10 - 50 k UGF  - 7/7  CMV, BK pending  - 7/8 HIV  RPR pending  Rx Pip/tazo      Antimicrobial allergy/intolerance:  Cefepime - facial rash         RECOMMENDATIONS    Diagnosis  FU C diff, GIPP,   FU serum CMV, BK virus  Follow up 7/7 BCx  FU RPR, urine gonorrhea, urine chlamydia    Management  STOP daptomycin and STOP  pip/tazo  START cipro 500 mg BID to complete 10-14 days (start 7/7)    Antimicrobial prophylaxis required for transplant immunosuppression   none    Intensive toxicity monitoring for prescription antimicrobials   CBC w/diff at least once per week  CMP at least once per week  clinical assessments for rashes or other skin changes    The [facility] ID service will continue to follow.           Please page the ID Transplant/Liquid Oncology Fellow consult at [phone] with questions.  Patient discussed with Dr. Julaine Hua.    Clista Bernhardt, MD   Division of Infectious Diseases    Subjective:     External record(s): Primary team note: considering adjusting myfortic  .    Independent historian(s): no independent historian required.       Interval History: AFVSS on RA, bowel movements slightly more formed today, tolerating po, no urinary symptoms     Medications:  Current Medications as of 09/28/2021  Scheduled  PRN   cholecalciferol (vitamin D3-125 mcg (5,000 unit)), 125 mcg, Daily  ciprofloxacin HCl, 500 mg, daily  clonazePAM, 0.5 mg, BID  heparin (porcine) for subcutaneous use, 5,000 Units, Q8H SCH  levETIRAcetam, 250 mg, BID  mycophenolate, 360 mg, BID  predniSONE, 5 mg, Daily  sodium bicarbonate, 650 mg, BID      acetaminophen, 650 mg, Q6H PRN  melatonin, 3 mg, Nightly PRN  ondansetron, 4  mg, Q8H PRN   Or  ondansetron, 4 mg, Q8H PRN         Objective:     Vital Signs last 24 hours:  Temp:  [36.4 ??C (97.5 ??F)-36.7 ??C (98.1 ??F)] 36.7 ??C (98 ??F)  Heart Rate:  [77-88] 85  SpO2 Pulse:  [77-88] 77  Resp:  [12-25] 13  BP: (125-141)/(90-99) 133/99  MAP (mmHg):  [99-108] 108  SpO2:  [96 %-99 %] 99 %    Physical Exam:   Patient Lines/Drains/Airways Status       Active Active Lines, Drains, & Airways       Name Placement date Placement time Site Days    Peripheral IV 09/26/21 Right Antecubital 09/26/21  1045  Antecubital  2    Arteriovenous Fistula - Vein Graft  Access Arteriovenous fistula Left;Upper Arm --  --  Arm  --                  Const [x]  vital signs above    []  NAD, non-toxic appearance []  Chronically ill-appearing, non-distressed  Chronically ill appearing, less fatigued      Eyes []  Lids normal bilaterally, conjunctiva anicteric and noninjected OU     [] PERRL  [] EOMI        ENMT [x]  Normal appearance of external nose and ears, no nasal discharge        []  MMM, no lesions on lips or gums []  No thrush, leukoplakia, oral lesions  []  Dentition good []  Edentulous []  Dental caries present  []  Hearing normal  []  TMs with good light reflexes bilaterally   Dry mucous membranes       Neck []  Neck of normal appearance and trachea midline        []  No thyromegaly, nodules, or tenderness   []  Full neck ROM        Lymph [x]  No LAD in neck     []  No LAD in supraclavicular area     []  No LAD in axillae   []  No LAD in epitrochlear chains     []  No LAD in inguinal areas        CV [x]  RRR            []  No peripheral edema     []  Pedal pulses intact   []  No abnormal heart sounds appreciated   []  Extremities WWP   C      Resp [x]  Normal WOB at rest    []  No breathlessness with speaking, no coughing  []  CTA anteriorly    [x]  CTA posteriorly          GI []  Normal inspection, NTND   []  NABS     []  No umbilical hernia on exam       []  No hepatosplenomegaly     []  Inspection of perineal and perianal areas normal        GU []  Normal external genitalia     [] No urinary catheter present in urethra   []  No CVA tenderness    []  No tenderness over renal allograft  Mild suprapubic TTP has resolved      MSK [x]  No clubbing or cyanosis of hands       []  No vertebral point tenderness  []  No focal tenderness or abnormalities on palpation of joints in RUE, LUE, RLE, or LLE        Skin []  No rashes, lesions, or ulcers of visualized skin     []  Skin warm and  dry to palpation         Neuro [x]  Face expression symmetric  []  Sensation to light touch grossly intact throughout    []  Moves extremities equally    []  No tremor noted        []  CNs II-XII grossly intact     []  DTRs normal and symmetric throughout []  Gait unremarkable        Psych []  Appropriate affect       []  Fluent speech         []  Attentive, good eye contact  [x]  Oriented to person, place, time          []  Judgment and insight are appropriate   Flat affect        Data for Medical Decision Making     (09/26/21) EKG QTcF 411 ms     I discussed mgm't w/qualified health care professional(s) involved in case: primary team re transition to cipro .    I reviewed  leukopenia to 2.1, hg 9.8, plt 322, Cr 3.39 from 4.32, .    I independently visualized/interpreted not done.       Recent Labs   Lab Units 09/28/21  0556 09/27/21  0550 09/26/21  1721 09/26/21  1056   WBC 10*9/L 2.1* 2.0*  --  4.3   HEMOGLOBIN g/dL 9.8* 9.8*  --  9.4*   PLATELET COUNT (1) 10*9/L 322 273  --  >258   NEUTRO ABS 10*9/L 0.7* 1.1*  --  1.9   LYMPHO ABS 10*9/L  --  0.4*  --   --    EOSINO ABS 10*9/L  --  0.0  --   --    BUN mg/dL 82* 98*   < > 161*   CREATININE mg/dL 0.96* 0.45*   < > 4.09*   AST U/L  --   --   --  33   ALT U/L  --   --   --  87*   BILIRUBIN TOTAL mg/dL  --   --   --  0.5   ALK PHOS U/L  --   --   --  73   POTASSIUM WHOLE BLOOD   --   --    < > 3.9   POTASSIUM mmol/L 4.6 4.4   < > 3.8   MAGNESIUM mg/dL 2.2 2.3  --  2.6   PHOSPHORUS mg/dL 3.8 6.1*   < >  --    CALCIUM mg/dL 9.6 9.5   < > 9.7   CK TOTAL U/L  --  26.0*   < >  --     < > = values in this interval not displayed.       Microbiology:  Microbiology Results (last day)       Procedure Component Value Date/Time Date/Time    GI Pathogen Panel [[id number]]  (Normal) Collected: 09/28/21 0640    Lab Status: Final result Specimen: Stool  Updated: 09/28/21 1512     Giardia Negative     Cryptosporidium Negative     E. coli: O157 Negative     E. coli: Enterotoxigenic (ETEC) Negative     E. coli: Shiga-toxin (STEC) Negative     Salmonella Negative     Shigella Negative     Campylobacter Negative     Comment: Detects C. jejuni, C. coli and C. lari.        Norovirus Negative     Rotavirus Negative    Narrative:      Test  performed using Lum[employer] xTAG Gastrointestinal Pathogen Panel, an FDA-cleared qualitative molecular multiplex test. Positive results for Campylobacter, E. coli O157, shiga toxin producing E. coli (STEC), Salmonella, Shigella and Cryptosporidium must be reported by both the Physician and Laboratory to the [facility], Division of Epidemiology.    C. Difficile Assay [[id number]]  (Normal) Collected: 09/28/21 0640    Lab Status: Final result Specimen: Stool  Updated: 09/28/21 1450     C. Diff Result Negative     Comment: Clostridium difficile NOT detected       Narrative:      The methodology of this test detects C. difficile toxin A and/or toxin B, by EIA.        Blood Culture [[id number]]  (Normal) Collected: 09/26/21 1120    Lab Status: Preliminary result Specimen: Blood from 1 Peripheral Draw Updated: 09/28/21 1215     Blood Culture, Routine No Growth at 48 hours    Blood Culture [[id number]]  (Normal) Collected: 09/26/21 1058    Lab Status: Preliminary result Specimen: Blood from 1 Peripheral Draw Updated: 09/28/21 1215     Blood Culture, Routine No Growth at 48 hours    Urine Culture [[id number]]  (Abnormal) Collected: 09/26/21 1316    Lab Status: Final result Specimen: Urine from Clean Catch Updated: 09/28/21 0819     Urine Culture, Comprehensive 50,000 to 100,000 CFU/mL Escherichia coli     Comment: Susceptibility performed on Previous Isolate - 09/26/2021         10,000 to 50,000 CFU/mL Mixed Urogenital Flora    Narrative:      Specimen Source: Clean Catch    Urine Culture [[id number]]  (Normal) Collected: 09/26/21 2210    Lab Status: Final result Specimen: Urine from Clean Catch Updated: 09/28/21 0802     Urine Culture, Comprehensive NO GROWTH    Narrative:      Specimen Source: Clean Catch    Chlamydia/Gonorrhoeae NAA [[id number]] Collected: 09/28/21 0015    Lab Status: In process Specimen: Urine Updated: 09/28/21 0025            Imaging:  ECG 12 Lead    Result Date: 09/28/2021  NORMAL SINUS RHYTHM MINIMAL VOLTAGE CRITERIA FOR LVH, MAY BE NORMAL VARIANT ( R in aVL ) BORDERLINE ECG WHEN COMPARED WITH ECG OF 26-Sep-2021 10:47, NO SIGNIFICANT CHANGE WAS FOUND    ECG 12 Lead    Result Date: 09/26/2021  NORMAL SINUS RHYTHM NORMAL ECG WHEN COMPARED WITH ECG OF 10-Mar-2019 05:20, NO SIGNIFICANT CHANGE WAS FOUND Confirmed by Christella Noa ([id number]) on 09/26/2021 11:14:34 AM    XR Chest Portable    Result Date: 09/26/2021  EXAM: XR CHEST PORTABLE DATE: 09/26/2021 11:02 AM ACCESSION: [id number] UN DICTATED: 09/26/2021 11:14 AM INTERPRETATION LOCATION: [facility]     CLINICAL INDICATION: 31 years old Female with FEVER      TECHNIQUE: Single View AP Chest Radiograph.     COMPARISON: Chest 03/29/2020     FINDINGS:     Low lung volumes. Lungs are clear. No pleural effusion or pneumothorax.     Unremarkable cardiomediastinal silhouette.             No acute abnormalities.    US Renal Transplant W Doppler    Result Date: 09/26/2021  EXAM: US RENAL TRANSPLANT W DOPPLER DATE: 09/26/2021 ACCESSION: [id number] UN DICTATED: 09/26/2021 11:46 AM INTERPRETATION LOCATION: [facility]     CLINICAL INDICATION: 31 years old Female with kidney transplant     COMPARISON: Renal transplant Doppler 04/26/2020  TECHNIQUE:  Ultrasound views of the renal transplant were obtained using gray scale and color and spectral Doppler imaging. Views of the urinary bladder were obtained using gray scale and limited color Doppler imaging.     FINDINGS:     TRANSPLANTED KIDNEY: The renal transplant was located in the right lower quadrant. Normal size and echogenicity.  No solid masses or calculi. No perinephric collections identified. No hydronephrosis.     VESSELS: - Perfusion: Using power Doppler, normal perfusion was seen throughout the renal parenchyma. - Resistive indices in the renal transplant are stable compared with prior examination. - Main renal artery/iliac artery: Patent - Main renal vein/iliac vein: Patent     BLADDER: Unremarkable.         No significant change compared with prior study. Grossly stable resistive indices in the renal transplant arteries, borderline at the main renal artery anastomosis.     Please see below for data measurements:     Transplant location: RLQ     Renal Transplant: Sagittal 12.8 cm; AP 5.8 cm; Transverse 5.7 cm     Segmental artery superior resistive index: 0.72 Segmental artery mid resistive index: 0.66 Segmental artery inferior resistive index: 0.61     Previous resistive indices range of segmental arteries: 0.62-0.69     Main renal artery peak systolic velocity at anastomosis: 1.21 m/s Main renal artery hilum resistive index: 0.67 Main renal artery mid resistive index: 0.72 Main renal artery anastomosis resistive index: 0.82     Previous resistive indices range of main renal artery: 0.74-0.78     Main renal vein: patent     Iliac artery: Patent Iliac vein: Patent     Bladder volume prevoid: 58.8  mL

## 2021-09-28 NOTE — Unmapped (Signed)
VSS w/ NSR in the 70's and 80's.  Assisted to the bathroom with pt using rolling walker. Pt uses call bell. Able to send urine specimen to lab. Instructed pt not to empty collection hat when having a BM to be able to send stool sample to r/o C. Diff. No c/o pain.   Problem: Adult Inpatient Plan of Care  Goal: Plan of Care Review  Outcome: Ongoing - Unchanged  Goal: Patient-Specific Goal (Individualized)  Outcome: Ongoing - Unchanged  Goal: Absence of Hospital-Acquired Illness or Injury  Outcome: Ongoing - Unchanged  Intervention: Identify and Manage Fall Risk  Recent Flowsheet Documentation  Taken 09/27/2021 2000 by Greggory Stallion, RN  Safety Interventions:   fall reduction program maintained   lighting adjusted for tasks/safety   low bed  Intervention: Prevent and Manage VTE (Venous Thromboembolism) Risk  Recent Flowsheet Documentation  Taken 09/27/2021 2200 by Greggory Stallion, RN  Activity Management: activity adjusted per tolerance  Taken 09/27/2021 2000 by Greggory Stallion, RN  Activity Management: activity adjusted per tolerance  Intervention: Prevent Infection  Recent Flowsheet Documentation  Taken 09/27/2021 2000 by Greggory Stallion, RN  Infection Prevention:   rest/sleep promoted   single patient room provided  Goal: Optimal Comfort and Wellbeing  Outcome: Ongoing - Unchanged  Goal: Readiness for Transition of Care  Outcome: Ongoing - Unchanged  Goal: Rounds/Family Conference  Outcome: Ongoing - Unchanged     Problem: Impaired Wound Healing  Goal: Optimal Wound Healing  Outcome: Ongoing - Unchanged  Intervention: Promote Wound Healing  Recent Flowsheet Documentation  Taken 09/27/2021 2200 by Greggory Stallion, RN  Activity Management: activity adjusted per tolerance  Taken 09/27/2021 2000 by Greggory Stallion, RN  Activity Management: activity adjusted per tolerance     Problem: Infection  Goal: Absence of Infection Signs and Symptoms  Outcome: Ongoing - Unchanged  Intervention: Prevent or Manage Infection  Recent Flowsheet Documentation  Taken 09/27/2021 2000 by Greggory Stallion, RN  Infection Management: aseptic technique maintained  Isolation Precautions: enteric precautions maintained     Problem: Self-Care Deficit  Goal: Improved Ability to Complete Activities of Daily Living  Outcome: Ongoing - Unchanged     Problem: Fall Injury Risk  Goal: Absence of Fall and Fall-Related Injury  Outcome: Ongoing - Unchanged  Intervention: Promote Injury-Free Environment  Recent Flowsheet Documentation  Taken 09/27/2021 2000 by Greggory Stallion, RN  Safety Interventions:   fall reduction program maintained   lighting adjusted for tasks/safety   low bed

## 2021-09-28 NOTE — Unmapped (Signed)
Tacrolimus Therapeutic Monitoring Pharmacy Note    Carolyn Kelley is a 31 y.o. female continuing tacrolimus.     Indication: Kidney transplant     Date of Transplant:  02/2019       Prior Dosing Information: Home regimen Envarsus 7 mg daily      Goals:  Therapeutic Drug Levels  Tacrolimus trough goal:  6-10 ng/mL    Additional Clinical Monitoring/Outcomes  Monitor renal function (SCr and urine output) and liver function (LFTs)  Monitor for signs/symptoms of adverse events (e.g., hyperglycemia, hyperkalemia, hypomagnesemia, hypertension, headache, tremor)    Results:   Tacrolimus level:  18.7 ng/mL, drawn appropriately    Pharmacokinetic Considerations and Significant Drug Interactions:  Concurrent hepatotoxic medications: None identified  Concurrent CYP3A4 substrates/inhibitors: None identified  Concurrent nephrotoxic medications: None identified    Assessment/Plan:  Recommendedation(s)  Will hold Envarsus - because it is a once daily medication and patient received AM dose today already - tomorrow's level will likely be supratherapeutic as well    Follow-up  Daily tac levels .   A pharmacist will continue to monitor and recommend levels as appropriate    Please page service pharmacist with questions/clarifications.    Vertis Kelch, PharmD

## 2021-09-28 NOTE — Unmapped (Signed)
Nephrology ([facility]) Progress Note    Assessment & Plan:   Cait Locust is a 31 y.o. 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 coli urinary tract infection and AKI now improving.  Now floor status.    Principal Problem:    Acute kidney injury (CMS-HCC)  Active Problems:    UTI (urinary tract infection)    History of immunosuppression therapy    History of international travel    Leukopenia  Resolved Problems:    * No resolved hospital problems. *    Active Problems     Acute kidney injury - UTI - Metabolic acidosis  Urine microscopy notable for bacteria otherwise no significant findings. Given patient's persistent diarrhea causing dehydration and hypotension in clinic, etiology of AKI likely prerenal with concomitant infection.  Hypotension is now resolved.  Urine culture positive for E. Coli. RPP -. Previously had reported some diarrhea, now improving.  - Appreciate ICID input  - Follow-up urine and blood cultures  - Follow-up C diff assay, HIV, GC NAA urine, GI pathogen panel.  CMV PCR was canceled. Re-order if diarrhea worsens.   - Discontinue Daptomcyin and pip/tazo (7/7-7/9 ).  Transition to total 14-day course ciprofloxacin 500 qd (7/9-7/20), renal dosing  - Check CK weekly; LFTs weekly    Leukopenia  Neutropenia and lymphopenia in setting of chronic immunosuppressive use.  -Trend daily CBC with differential for now and adjust Myfortic dose as appropriate     S/P deceased donor kidney transplant with C3 glomerulopathy recurrence - Immunosuppression  Kidney biopsy 12/29/19 confirmed presence of C3 glomerulopathy recurrence. It is possible she has a flare of C3 glomerulopathy or rejection though these seem less likely.  Also Consider kidney biopsy if suspicion increases.   - Restarted home Myfortic at half dose (360mg  BID); home dose is 720mg  BID  - Trend Envarsus levels.  Holding a.m. tacrolimus dose 7/10 given elevated trough today.   - Hold aspirin   - Continue home prednisone     Chronic Problems  Seizures Disorder, no seizures since 2014:    EEG with no evidence of seizures on 06/25/20. Keppra taper is being initiated by Neurology team. She will follow up with [name], NP at [facility]        Myoclonus:   - Continue home Klonopin    Issues Impacting Complexity of Management:  -Intensive monitoring of drug toxicity from Tacrolimus with scheduled levels    Daily Checklist:  Diet: Regular Diet  DVT PPx: Heparin 5000units q8h  Electrolytes: Replete Potassium to >/= 3.6 and Magnesium to >/= 1.8  Code Status: Full Code  Dispo: Home, pending PT/OT reccs    Team Contact Information:   Primary Team: Nephrology ([facility])  Primary Resident: Barnett Abu, MD  Resident's Pager: [phone] (Nephrology Intern - Alvester Morin)    Interval History:   No acute events overnight. She had no acute complaints today. Her Cr has improved.  Remains leukopenic, Myfortic dose may need to be adjusted.  Tacrolimus level was elevated will hold tomorrow's morning dose.  Otherwise stable for floor status.  Have discontinued daptomycin and PIP Tazo per ID recommendations and started ciprofloxacin for total 14-day course.    All other systems were reviewed and are negative except as noted in the HPI    Objective:   Temp:  [36.4 ??C (97.5 ??F)-36.7 ??C (98.1 ??F)]  36.7 ??C (98 ??F)  Heart Rate:  [77-88] 85  SpO2 Pulse:  [77-88] 77  Resp:  [12-25] 13  BP: (118-141)/(65-99) 133/99  SpO2:  [96 %-99 %] 99 %,   Intake/Output Summary (Last 24 hours) at 09/28/2021 1127  Last data filed at 09/28/2021 1610  Gross per 24 hour   Intake 1890 ml   Output 400 ml   Net 1490 ml       Gen: Soft-spoken female in no acute distress lying on side. Has a purple walker by bedside.   HENT: atraumatic, normocephalic  Heart: RRR  Lungs: CTAB, no crackles or wheezes  Abdomen: Nondistended  Extremities: No edema, wwp

## 2021-09-29 ENCOUNTER — Telehealth: Payer: Self-pay | Admitting: *Deleted

## 2021-09-29 DIAGNOSIS — G253 Myoclonus: Secondary | ICD-10-CM

## 2021-09-29 LAB — HEPATIC FUNCTION PANEL
ALBUMIN: 2.8 g/dL — ABNORMAL LOW (ref 3.4–5.0)
ALKALINE PHOSPHATASE: 55 U/L (ref 46–116)
ALT (SGPT): 57 U/L — ABNORMAL HIGH (ref 10–49)
AST (SGOT): 36 U/L — ABNORMAL HIGH (ref <=34)
BILIRUBIN DIRECT: 0.2 mg/dL (ref 0.00–0.30)
BILIRUBIN TOTAL: 0.5 mg/dL (ref 0.3–1.2)
PROTEIN TOTAL: 6.5 g/dL (ref 5.7–8.2)

## 2021-09-29 LAB — RENAL FUNCTION PANEL
ALBUMIN: 2.9 g/dL — ABNORMAL LOW (ref 3.4–5.0)
ANION GAP: 10 mmol/L (ref 5–14)
BLOOD UREA NITROGEN: 65 mg/dL — ABNORMAL HIGH (ref 9–23)
BUN / CREAT RATIO: 25
CALCIUM: 9.5 mg/dL (ref 8.7–10.4)
CHLORIDE: 112 mmol/L — ABNORMAL HIGH (ref 98–107)
CO2: 18 mmol/L — ABNORMAL LOW (ref 20.0–31.0)
CREATININE: 2.6 mg/dL — ABNORMAL HIGH
EGFR CKD-EPI (2021) FEMALE: 24 mL/min/{1.73_m2} — ABNORMAL LOW (ref >=60)
GLUCOSE RANDOM: 99 mg/dL (ref 70–179)
PHOSPHORUS: 3.4 mg/dL (ref 2.4–5.1)
POTASSIUM: 4.8 mmol/L (ref 3.4–4.8)
SODIUM: 140 mmol/L (ref 135–145)

## 2021-09-29 LAB — CMV DNA, QUANTITATIVE, PCR
CMV QUANT: <50 [IU]/mL — ABNORMAL HIGH (ref <0)
CMV VIRAL LD: DETECTED — AB

## 2021-09-29 LAB — MAGNESIUM: MAGNESIUM: 1.8 mg/dL (ref 1.6–2.6)

## 2021-09-29 LAB — SYPHILIS SCREEN: SYPHILIS RPR SCREEN: NONREACTIVE

## 2021-09-29 LAB — CBC W/ AUTO DIFF
BASOPHILS ABSOLUTE COUNT: 0 10*9/L (ref 0.0–0.1)
BASOPHILS RELATIVE PERCENT: 1 %
EOSINOPHILS ABSOLUTE COUNT: 0.1 10*9/L (ref 0.0–0.5)
EOSINOPHILS RELATIVE PERCENT: 3.2 %
HEMATOCRIT: 29.1 % — ABNORMAL LOW (ref 34.0–44.0)
HEMOGLOBIN: 10.1 g/dL — ABNORMAL LOW (ref 11.3–14.9)
LYMPHOCYTES ABSOLUTE COUNT: 1 10*9/L — ABNORMAL LOW (ref 1.1–3.6)
LYMPHOCYTES RELATIVE PERCENT: 40.9 %
MEAN CORPUSCULAR HEMOGLOBIN CONC: 34.5 g/dL (ref 32.0–36.0)
MEAN CORPUSCULAR HEMOGLOBIN: 30.6 pg (ref 25.9–32.4)
MEAN CORPUSCULAR VOLUME: 88.6 fL (ref 77.6–95.7)
MEAN PLATELET VOLUME: 7.9 fL (ref 6.8–10.7)
MONOCYTES ABSOLUTE COUNT: 0.7 10*9/L (ref 0.3–0.8)
MONOCYTES RELATIVE PERCENT: 26.6 %
NEUTROPHILS ABSOLUTE COUNT: 0.7 10*9/L — ABNORMAL LOW (ref 1.8–7.8)
NEUTROPHILS RELATIVE PERCENT: 28.3 %
PLATELET COUNT: 356 10*9/L (ref 150–450)
RED BLOOD CELL COUNT: 3.29 10*12/L — ABNORMAL LOW (ref 3.95–5.13)
RED CELL DISTRIBUTION WIDTH: 13.7 % (ref 12.2–15.2)
WBC ADJUSTED: 2.5 10*9/L — ABNORMAL LOW (ref 3.6–11.2)

## 2021-09-29 LAB — HIV ANTIGEN/ANTIBODY COMBO: HIV ANTIGEN/ANTIBODY COMBO: NONREACTIVE

## 2021-09-29 LAB — SLIDE REVIEW

## 2021-09-29 LAB — TACROLIMUS LEVEL, TROUGH: TACROLIMUS, TROUGH: 16.5 ng/mL — ABNORMAL HIGH (ref 5.0–15.0)

## 2021-09-29 MED ORDER — CLONAZEPAM 0.5 MG PO TABS
0.5000 mg | ORAL_TABLET | Freq: Two times a day (BID) | ORAL | 3 refills | Status: DC
Start: 2021-09-29 — End: 2022-01-28

## 2021-09-29 MED ADMIN — clonazePAM (KlonoPIN) tablet 0.5 mg: .5 mg | ORAL | @ 01:00:00

## 2021-09-29 MED ADMIN — sodium bicarbonate tablet 650 mg: 650 mg | ORAL | @ 14:00:00

## 2021-09-29 MED ADMIN — levETIRAcetam (KEPPRA) tablet 250 mg: 250 mg | ORAL | @ 01:00:00

## 2021-09-29 MED ADMIN — sodium bicarbonate tablet 650 mg: 650 mg | ORAL | @ 01:00:00

## 2021-09-29 MED ADMIN — heparin (porcine) 5,000 unit/mL injection 5,000 Units: 5000 [IU] | SUBCUTANEOUS | @ 09:00:00

## 2021-09-29 MED ADMIN — predniSONE (DELTASONE) tablet 5 mg: 5 mg | ORAL | @ 14:00:00

## 2021-09-29 MED ADMIN — levETIRAcetam (KEPPRA) tablet 250 mg: 250 mg | ORAL | @ 14:00:00

## 2021-09-29 MED ADMIN — mycophenolate (MYFORTIC) EC tablet 360 mg: 360 mg | ORAL | @ 14:00:00 | Stop: 2021-09-29

## 2021-09-29 MED ADMIN — heparin (porcine) 5,000 unit/mL injection 5,000 Units: 5000 [IU] | SUBCUTANEOUS | @ 01:00:00

## 2021-09-29 MED ADMIN — mycophenolate (MYFORTIC) EC tablet 360 mg: 360 mg | ORAL | @ 01:00:00

## 2021-09-29 MED ADMIN — clonazePAM (KlonoPIN) tablet 0.5 mg: .5 mg | ORAL | @ 14:00:00

## 2021-09-29 MED ADMIN — cholecalciferol (vitamin D3 25 mcg (1,000 units)) tablet 125 mcg: 125 ug | ORAL | @ 14:00:00

## 2021-09-29 MED ADMIN — ciprofloxacin HCl (CIPRO) tablet 500 mg: 500 mg | ORAL | @ 09:00:00 | Stop: 2021-10-09

## 2021-09-29 MED ADMIN — heparin (porcine) 5,000 unit/mL injection 5,000 Units: 5000 [IU] | SUBCUTANEOUS | @ 18:00:00

## 2021-09-29 NOTE — Telephone Encounter (Signed)
Heather Sims is calling to ask for you to refill her clonazepam.

## 2021-09-29 NOTE — Telephone Encounter (Signed)
done

## 2021-09-29 NOTE — Unmapped (Signed)
Care Management  Initial Transition Planning Assessment              General  Care Manager assessed the patient by : In person interview with patient, Medical record review, Discussion with Clinical Care team  Orientation Level: Oriented X4  Functional level prior to admission: Independent  Who provides care at home?: Family member (Pt lives w/ her mother. 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     CM met w/ pt to complete assessment. She was A&Ox4, somewhat groggy as she was sleeping on CM arrival.      OT noted some cognitive delays.  Pt is slow to respond and has a history of an anoxic brain injury and seizures.  She did answer questions appropriately .  She was assessed by SW and transplant psychology prior to transplant and there were no significant cognitive barriers assessed as a barrier to transplant. Her mother does assist w/ appointment coordination and medication compliance.     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     Pt uses a walker for ambulation.  She was working towards using a cane only but then her PT stopped.     CM has tasked CMA to see if [facility] services can be resumed.     Contact/Decision Maker  Extended Emergency Contact Information  Primary Emergency Contact: Rogelia Boga  Address: [address]  [place], [place] [address] [place]  Home Phone: [phone]  Mobile Phone: [phone]  Relation: Mother    Legal Next of Kin / Guardian / POA / Advance Directives       Advance Directive (Medical Treatment)  Does patient have an advance directive covering medical treatment?: Patient does not have advance directive covering medical treatment.  Reason patient does not have an advance directive covering medical treatment:: Patient does not wish to complete one at this time.    Health Care Decision Maker [HCDM] (Medical & Mental Health Treatment)  Healthcare Decision Maker: Patient needs follow-up to appoint a Health Care Decision Maker.  Information offered on HCDM, Medical & Mental Health advance directives:: Patient declined information.      Readmission Information    Have you been hospitalized in the last 30 days?: No    Patient Information  Lives with: Family members (Pt lives w/ her parents and brother)    Type of Residence: Private residence  Type of Residence: Mailing Address:    [address]    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  Past Surgical History:   has a past surgical history that includes AV fistula placement (Left); pr transplant,prep cadaver renal  graft (N/A, 03/10/2019); and pr transplantation of kidney (N/A, 03/10/2019).   Previous admit date: 03/10/2019    Primary Insurance- Payor: [place] MGD CAID [employer] OF [place], [place] / Plan: [place] MGD CAID [facility] OF [place], [place] / Product Type: *No Product type* /   Secondary Insurance - None  Prescription Coverage - MGD MCAID  Preferred Pharmacy - [facility]  [facility]  [id number] - [place], [place] - [address] [address]  [facility] - [place], [place] - [address]  [id number] - [place], [place] - [address]    Transportation home: Paediatric nurse Systems/Concerns: Family Members, Parent    Responsibilities/Dependents at home?: No    Home Care services in place prior to admission?: No    Outpatient/Community Resources in place prior to admission: Clinic  Agency detail (Name/Phone #): [facility] in [place] [phone]) - also followed in transplant clinic    Equipment Currently Used at Home: walker, rolling       Currently receiving outpatient dialysis?: No       Financial Information       Need for financial assistance?: No       Social Determinants of Health  Food Insecurity: No Food Insecurity    Worried About Programme researcher, broadcasting/film/video in the Last Year: Never true    Ran Out of Food in the Last Year: Never true      Financial Resource Strain: Low Risk     Difficulty of Paying Living Expenses: Not very hard     Housing/Utilities: Low Risk     Within the past 12 months, have you ever stayed: outside, in a car, in a tent, in an overnight shelter, or temporarily in someone else's home (i.e. couch-surfing)?: No    Are you worried about losing your housing?: No    Within the past 12 months, have you been unable to get utilities (heat, electricity) when it was really needed?: No     Transportation Needs: No Transportation Needs    Lack of Transportation (Medical): No    Lack of Transportation (Non-Medical): No       Complex Discharge Information    Is patient identified as a difficult/complex discharge?: No    Discharge Needs Assessment  Concerns to be Addressed: discharge planning    Clinical Risk Factors: New Diagnosis, Multiple Diagnoses (Chronic), Functional Limitations, Other (Comment) (history of transplant)    Barriers to taking medications: No    Prior overnight hospital stay or ED visit in last 90 days: Yes      Anticipated Changes Related to Illness: none    Equipment Needed After Discharge: none    Discharge Facility/Level of Care Needs: other (see comments) (home - tasked CMA to try and secure HH for pt)    Readmission  Risk of Unplanned Readmission Score: UNPLANNED READMISSION SCORE: 15.85%  Predictive Model Details          16% (Medium)  Factor Value    Calculated 09/29/2021 12:04 19% Number of active Rx orders 24    [place] Risk of Unplanned Readmission Model 11% ECG/EKG order present in last 6 months     9% Latest BUN high (65 mg/dL)     8% Imaging order present in last 6 months     7% Latest hemoglobin low (10.1 g/dL)     7% Phosphorous result present  6% Number of ED visits in last six months 1     5% Diagnosis of deficiency anemia present     5% Active anticoagulant Rx order present     5% Active corticosteroid Rx order present     5% Latest creatinine high (2.60 mg/dL)     4% Diagnosis of renal failure present     3% Current length of stay 2.821 days     3% Age 36     3% Charlson Comorbidity Index 2     2% Future appointment scheduled      Readmitted Within the Last 30 Days? (No if blank)        Discharge Plan  Screen findings are: Discharge planning needs identified or anticipated (Comment). (possible HH)    Expected Discharge Date: 09/30/2021      Quality data for continuing care services shared with patient and/or representative?: N/A  Patient and/or family were provided with choice of facilities / services that are available and appropriate to meet post hospital care needs?: Yes   List choices in order highest to lowest preferred, if applicable. : Has used [facility] in the past - if unable to accept no preference    Initial Assessment complete?: Yes

## 2021-09-29 NOTE — Unmapped (Addendum)
OCCUPATIONAL THERAPY  Evaluation (09/29/21 0934)    Patient Name:  Carolyn Kelley       Medical Record Number: [id number]   Date of Birth: [birth year]  Sex: Female            OT Treatment Diagnosis:  Pt presents to OT with decreased STM, safety awareness, and increased brain fog.    Assessment  Problem List: Decreased cognition, Decreased safety awareness                 Assessment: Carolyn Kelley is a 31 y.o. 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 At this time the pt presents with problem list as documented above, all of which limit ADL and functional mobility.  This indicates that the pt would benefit from OT services to maximize functional independence and safety. The Short Blessed Test was administered and the patient scored a 12, indicating a cognitive impairment consistent with dementia, which is concerning for driving and home safety. I would clinically advise against driving for patient and public safety. After review of the patient's occupational profile and history, assessment of occupational performance, clinical decision making, and development of POC, the patient presents as a moderate complexity case.  Today's Interventions: ADL retraining, Functional cognition  Today's Interventions: Discussed school resources with patient regarding her school's disability office. Pt states she uses an [employer] to assist with her memory, as well as a Biomedical engineer. pt engaged in hygiene routine at the sink with supervision, toileted with distant supervision, and was setup for total body bathing at session end.    AM-PAC-Daily Activity  Lower Body Dressing assistance needs: A Little - Minimal/Contact Guard Assist/Supervision  Bathing assistance needs: A Little - Minimal/Contact Guard Assist/Supervision  Toileting assistance needs: A Little - Minimal/Contact Guard Assist/Supervision  Upper Body Dressing assistance needs: None - Modified Independent/Independent  Personal Grooming assistance needs: None - Modified Independent/Independent  Eating Meals assistance needs: None - Modified Independent/Independent    Daily Activity Score:  Daily Activity Score: 21    Score (in points): % of Functional Impairment, Limitation, Restriction  6: 100% impaired, limited, restricted  7-8: At least 80%, but less than 100% impaired, limited restricted  9-13: At least 60%, but less than 80% impaired, limited restricted  14-19: At least 40%, but less than 60% impaired, limited restricted  20-22: At least 20%, but less than 40% impaired, limited restricted  23: At least 1%, but less than 20% impaired, limited restricted  24: 0% impaired, limited restricted   Activity Tolerance During Today's Session  Tolerated treatment well    Plan  Planned Frequency of Treatment:  1-2x per day for: 1-2x week  Planned Treatment Duration: 10/20/21    Planned Interventions:  Adaptive equipment, ADL retraining, Balance activities, Compensatory tech. training, Conservation, Education - Patient, Environmental support, Functional cognition, Functional mobility, Positioning, Safety education, Transfer training    Post-Discharge Occupational Therapy Recommendations:   Skilled OT services indicated, 3x weekly, Tourist information centre manager with 24/7 supervision  OT DME Recommendations: Rolling walker -        GOALS:   Patient and Family Goals: Pt would like to return to school    IP Long Term Goal #1: Pt will score a 24/24 on the [facility] in 6 weeks       Short Term:  SHORT GOAL #1: Pt  will engage in total body bathing (showering) with MOD I and good safety awareness.   Time Frame : 2 weeks  SHORT GOAL #2: Pt will name 3 compensatory strategies to assist with STM deficits.   Time Frame : 2 weeks  SHORT GOAL #3: Pt will acurately identify 100% of unsafe scenarious in simulated home setup (either with picture cards or simulated in room).   Time Frame : 2 weeks                   Prognosis:  Good  Positive Indicators:  PLOF, CLOF, family support  Barriers to Discharge: Decreased caregiver support, Cognitive deficits (Pt states she is home alone for much of the day.)    Subjective  Current Status pt started session in bed, finished session in bathroom setup with bathing supplies, RN updated and notified of patient's position.  Prior Functional Status Pt reports IND with ADLs, going to school for psychology, drives, no fall history.    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     Chronic kidney disease     Depressive disorder, not elsewhere classified     Migraine with aura, without mention of intractable migraine without mention of status migrainosus     Seizures (CMS-HCC)     Unspecified vitamin B deficiency     Social History     Tobacco Use    Smoking status: Never    Smokeless tobacco: Never   Substance Use Topics    Alcohol use: No      Past Surgical History:   Procedure Laterality Date    AV FISTULA PLACEMENT Left     PR TRANSPLANT,PREP CADAVER RENAL GRAFT N/A 03/10/2019    Procedure: BACKBNCH STD PREP CAD DONR RENAL ALLOGFT PRIOR TO TRNSPLNT, INCL DISSEC/REM PERINEPH FAT, DIAPH/RTPER ATTAC;  Surgeon: Leona Carry, MD;  Location: MAIN OR [place];  Service: Transplant    PR TRANSPLANTATION OF KIDNEY N/A 03/10/2019    Procedure: RENAL ALLOTRANSPLANTATION, IMPLANTATION OF GRAFT; WITHOUT RECIPIENT NEPHRECTOMY;  Surgeon: Leona Carry, MD;  Location: MAIN OR [facility];  Service: Transplant    Family History   Problem Relation Age of Onset    Migraines Mother     Diabetes Father Hypertension Father     Seizures Brother     Diabetes Maternal Grandmother     Hypertension Maternal Grandmother     Heart attack Maternal Grandmother     Melanoma Neg Hx     Basal cell carcinoma Neg Hx     Squamous cell carcinoma Neg Hx     Cancer Neg Hx         Bee pollen, Cefepime, and Pollen extracts     Objective Findings  Precautions / Restrictions  Falls precautions    Weight Bearing  Non-applicable    Required Braces or Orthoses  Non-applicable    Communication Preference  Verbal    Pain  Pt denies pain    Equipment / Environment  Vascular access (PIV, TLC, Port-a-cath, PICC), Telemetry    Living Situation  Living Environment: Apartment  Lives With: Family (mother, father, brother)  Home Living: One level home,  Tub bench, Standard height toilet (steep hill to enter)  Equipment available at home: Rolling walker (needs replaced, height adjusters are rusted)     Cognition   Orientation Level:  Oriented x 4   Arousal/Alertness:  Delayed responses to stimuli   Attention Span:  Attends with cues to redirect, Appears intact   Memory:  Decreased short term memory   Following Commands:  Follows all commands and directions without difficulty   Safety Judgment:  Good awareness of safety precautions   Awareness of Errors:  Assistance required to identify errors made, Assistance required to correct errors made   Problem Solving:  Assistance required to identify errors made, Assistance required to generate solutions, Assistance required to implement solutions   Comments: Pt reports history of TBI, kidney injury, and myocardial injury, pt notes brain fog and decreased STM. When asked about symptoms of her kidney disease, she stated I don't know. Pt demonstrated difficulty with shelling a hard-boiled egg with a fork and some difficulty during ADLs with sequencing and speed. Pt also reports that herself and others in her family frequently forget to leave the stove on when she is at home.    Vision / Hearing   Vision: No deficits identified     Hearing: No deficit identified         Hand Function:  Right Hand Function: Right hand grip strength, ROM and coordination WNL  Left Hand Function: Left hand grip strength, ROM and coordination WNL  Hand Dominance: Right    Skin Inspection:  Skin Inspection: Intact where visualized    ROM / Strength:  UE ROM/Strength: Left WFL, Right WFL  LE ROM/Strength: Left WFL, Right WFL    Coordination:  Coordination: WFL    Sensation:  RUE Sensation: RUE intact  LUE Sensation: LUE intact  RLE Sensation: RLE intact  LLE Sensation: LLE intact    Balance:  Sitting EOB, IND, distant supervision standing with RW    Functional Mobility  Transfer Assistance Needed: No  Bed Mobility Assistance Needed: No  Ambulation: Pt completed functional mobility to bathroom with RW and distant supervision      ADLs  ADLs: Supervision  IADLs: NT      Vitals / Orthostatics  At Rest: VSS  With Activity: VSS  Vitals/Orthostatics: Asymptomatic      Medical Staff Made Aware: Rn updated and aware      Occupational Therapy Session Duration  OT Individual [mins]: 60         I attest that I have reviewed the above information.  Signed: Quentin Angst, OT  Filed 09/29/2021

## 2021-09-29 NOTE — Unmapped (Signed)
IMMUNOCOMPROMISED HOST INFECTIOUS DISEASE PROGRESS NOTE    Assessment/Plan:     Carolyn Kelley is a 31 y.o. 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 She is currently clinically improving on antibiotics.     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  - Serologies: CMV D+/R+, EBV D+/R+, Toxo D?/R-  - Induction: Campath  - Surgical complications: hypotension and hypoxia thought to be caused by an anaphylactic reaction to Campath  - Prophylaxis prior to admission: None  - History of rejection if any: None  - Immunosuppression: Myfortic 720 mg bid (increased due to C3 glomerulonephropathy recurrence), prednisone 5 mg daily (restarted due to C3 glomerulonephropathy recurrence), Envarsus 7 mg daily (target trough is 6-10 ng/mL)     Pertinent comorbidities:  Seizure disorder  Pseudoaneurysm of AVF   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     Pertinent exposures:  Recent travel to [place] (6/15-6/29)     Summary of pertinent prior infections:  None     Active infection:  #Sepsis 2/2 E coli UTI, 09/26/21  - History of recent travel to [place], and presented on 7/7 with ~1 week of fatigue, dysuria, diarrhea (resolved), and lower abdominal pain, found to be hypotensive and tachycardic  - 7/7 CXR without acute abnormalities, RPP negative, initial UA with squams, repeat UA (after abx) with no squams, 11 WBC, small leuk esterase, serum CMV <50, RPR nonreactive, GIPP and C diff negative  - 7/7 Ucx x2, 100 k E coli (Pan-susceptible), 50-100k E coli w/ 10 - 50K UGF  - 7/7 BK pending  - 7/8 HIV pending  Rx: 7/7 pip/tazo -> 7/9 cipro ->       Antimicrobial allergy/intolerance:  Cefepime - facial rash         RECOMMENDATIONS    Diagnosis  Follow up serum BK virus, HIV, urine gonorrhea, urine chlamydia  Follow up 7/7 BCx to completion    Management  Continue ciprofloxacin 500 mg daily to complete 14 day course (7/7-7/21), will need to adjust dosing as renal function changes    Antimicrobial prophylaxis required for transplant immunosuppression   none    Intensive toxicity monitoring for prescription antimicrobials   CBC w/diff at least once per week  CMP at least once per week  clinical assessments for rashes or other skin changes    The [facility] ID service will continue to follow.           Please page the ID Transplant/Liquid Oncology Fellow consult at [phone] with questions.  Patient discussed with Dr. Julaine Hua.    Althia Forts, MD  [facility] Division of Infectious Diseases    Subjective:     External record(s): Primary team note: considering adjusting myfortic, holding tomorrow's dose of tacro due to elevated level .    Independent historian(s): no independent historian required.       Interval History: Afebrile, no acute events overnight. Feeling well today, with no dysuria, subjective fevers, chills. Had one BM yesterday. Appetite still poor, but has been nibbling.    Medications:  Current Medications as of 09/29/2021  Scheduled  PRN   cholecalciferol (vitamin D3-125 mcg (5,000 unit)), 125 mcg, Daily  ciprofloxacin HCl, 500 mg, daily  clonazePAM, 0.5 mg, BID  heparin (porcine) for subcutaneous use, 5,000 Units, Q8H  SCH  levETIRAcetam, 250 mg, BID  mycophenolate, 360 mg, BID  predniSONE, 5 mg, Daily  sodium bicarbonate, 650 mg, BID      acetaminophen, 650 mg, Q6H PRN  melatonin, 3 mg, Nightly PRN  ondansetron, 4 mg, Q8H PRN   Or  ondansetron, 4 mg, Q8H PRN         Objective:     Vital Signs last 24 hours:  Temp:  [36.5 ??C (97.7 ??F)-36.8 ??C (98.2 ??F)] 36.8 ??C (98.2 ??F)  Heart Rate:  [79-81] 81  Resp:  [19-20] 19  BP: (133-138)/(84-96) 138/96  MAP (mmHg):  [99-110] 110  SpO2:  [96 %-100 %] 99 %    Physical Exam:   Patient Lines/Drains/Airways Status Active Active Lines, Drains, & Airways       Name Placement date Placement time Site Days    Peripheral IV 09/26/21 Right Antecubital 09/26/21  1045  Antecubital  3    Arteriovenous Fistula - Vein Graft  Access Arteriovenous fistula Left;Upper Arm --  --  Arm  --                  Const [x]  vital signs above    [x]  NAD, non-toxic appearance []  Chronically ill-appearing, non-distressed        Eyes [x]  Lids normal bilaterally, conjunctiva anicteric and noninjected OU     [] PERRL  [] EOMI        ENMT [x]  Normal appearance of external nose and ears, no nasal discharge        []  MMM, no lesions on lips or gums []  No thrush, leukoplakia, oral lesions  []  Dentition good []  Edentulous []  Dental caries present  []  Hearing normal  []  TMs with good light reflexes bilaterally   Cracked lips      Neck [x]  Neck of normal appearance and trachea midline        []  No thyromegaly, nodules, or tenderness   []  Full neck ROM        Lymph [x]  No LAD in neck     []  No LAD in supraclavicular area     []  No LAD in axillae   []  No LAD in epitrochlear chains     []  No LAD in inguinal areas        CV [x]  RRR            []  No peripheral edema     []  Pedal pulses intact   []  No abnormal heart sounds appreciated   []  Extremities WWP         Resp [x]  Normal WOB at rest    []  No breathlessness with speaking, no coughing  []  CTA anteriorly    [x]  CTA posteriorly          GI [x]  Normal inspection, NTND   [x]  NABS     []  No umbilical hernia on exam       []  No hepatosplenomegaly     []  Inspection of perineal and perianal areas normal        GU []  Normal external genitalia     [x] No urinary catheter present in urethra   []  No CVA tenderness    [x]  No tenderness over renal allograft  No suprapubic TTP      MSK [x]  No clubbing or cyanosis of hands       []  No vertebral point tenderness  []  No focal tenderness or abnormalities on palpation of joints in RUE, LUE, RLE, or LLE  Skin [x]  No rashes, lesions, or ulcers of visualized skin     [x]  Skin warm and dry to palpation         Neuro [x]  Face expression symmetric  []  Sensation to light touch grossly intact throughout    [x]  Moves extremities equally    []  No tremor noted        []  CNs II-XII grossly intact     []  DTRs normal and symmetric throughout []  Gait unremarkable        Psych [x]  Appropriate affect       []  Fluent speech         [x]  Attentive, good eye contact  []  Oriented to person, place, time          []  Judgment and insight are appropriate   Slightly slow response to questions        Data for Medical Decision Making     (09/26/21) EKG QTcF 411 ms     I discussed not done.    I reviewed CBC results (WBC low at 2.5, Plts normal at 356), chemistry results (Cr elevated but improving at 2.6 ), and C diff and GIPP negative .    I independently visualized/interpreted not done.       Recent Labs   Lab Units 09/29/21  0558 09/28/21  0556 09/27/21  0550   WBC 10*9/L 2.5*   < > 2.0*   HEMOGLOBIN g/dL 84.6*   < > 9.8*   PLATELET COUNT (1) 10*9/L 356   < > 273   NEUTRO ABS 10*9/L 0.7*   < > 1.1*   LYMPHO ABS 10*9/L 1.0*  --  0.4*   EOSINO ABS 10*9/L 0.1  --  0.0   BUN mg/dL 65*   < > 98*   CREATININE mg/dL 9.62*   < > 9.52*   AST U/L 36*  --   --    ALT U/L 57*  --   --    BILIRUBIN TOTAL mg/dL 0.5  --   --    ALK PHOS U/L 55  --   --    POTASSIUM mmol/L 4.8   < > 4.4   MAGNESIUM mg/dL 1.8   < > 2.3   PHOSPHORUS mg/dL 3.4   < > 6.1*   CALCIUM mg/dL 9.5   < > 9.5   CK TOTAL U/L  --   --  26.0*    < > = values in this interval not displayed.       Microbiology:  Microbiology Results (last day)       Procedure Component Value Date/Time Date/Time    GI Pathogen Panel [[id number]]  (Normal) Collected: 09/28/21 0640    Lab Status: Final result Specimen: Stool  Updated: 09/28/21 1512     Giardia Negative     Cryptosporidium Negative     E. coli: O157 Negative     E. coli: Enterotoxigenic (ETEC) Negative     E. coli: Shiga-toxin (STEC) Negative     Salmonella Negative     Shigella Negative     Campylobacter Negative Comment: Detects C. jejuni, C. coli and C. lari.        Norovirus Negative     Rotavirus Negative    Narrative:      Test performed using Luminex xTAG Gastrointestinal Pathogen Panel, an FDA-cleared qualitative molecular multiplex test. Positive results for Campylobacter, E. coli O157, shiga toxin producing E. coli (STEC), Salmonella, Shigella and Cryptosporidium must be reported by both the  Physician and Laboratory to the [facility], Division of Epidemiology.    C. Difficile Assay [[id number]]  (Normal) Collected: 09/28/21 0640    Lab Status: Final result Specimen: Stool  Updated: 09/28/21 1450     C. Diff Result Negative     Comment: Clostridium difficile NOT detected       Narrative:      The methodology of this test detects C. difficile toxin A and/or toxin B, by EIA.        Blood Culture [[id number]]  (Normal) Collected: 09/26/21 1120    Lab Status: Preliminary result Specimen: Blood from 1 Peripheral Draw Updated: 09/28/21 1215     Blood Culture, Routine No Growth at 48 hours    Blood Culture [[id number]]  (Normal) Collected: 09/26/21 1058    Lab Status: Preliminary result Specimen: Blood from 1 Peripheral Draw Updated: 09/28/21 1215     Blood Culture, Routine No Growth at 48 hours    Urine Culture [[id number]]  (Abnormal) Collected: 09/26/21 1316    Lab Status: Final result Specimen: Urine from Clean Catch Updated: 09/28/21 0819     Urine Culture, Comprehensive 50,000 to 100,000 CFU/mL Escherichia coli     Comment: Susceptibility performed on Previous Isolate - 09/26/2021         10,000 to 50,000 CFU/mL Mixed Urogenital Flora    Narrative:      Specimen Source: Clean Catch    Urine Culture [[id number]]  (Normal) Collected: 09/26/21 2210    Lab Status: Final result Specimen: Urine from Clean Catch Updated: 09/28/21 0802     Urine Culture, Comprehensive NO GROWTH    Narrative:      Specimen Source: Clean Catch    Chlamydia/Gonorrhoeae NAA [[id number]] Collected: 09/28/21 0015    Lab Status: In process Specimen: Urine Updated: 09/28/21 0025            Imaging:  ECG 12 Lead    Result Date: 09/28/2021  NORMAL SINUS RHYTHM MINIMAL VOLTAGE CRITERIA FOR LVH, MAY BE NORMAL VARIANT ( R in aVL ) WHEN COMPARED WITH ECG OF 26-Sep-2021 10:47, NO SIGNIFICANT CHANGE WAS FOUND Confirmed by Schuyler Amor ([id number]) on 09/28/2021 9:01:20 PM

## 2021-09-29 NOTE — Unmapped (Signed)
Tacrolimus Therapeutic Monitoring Pharmacy Note    Carolyn Kelley is a 31 y.o. female continuing tacrolimus.     Indication: Kidney transplant     Date of Transplant:  02/2019       Prior Dosing Information: Home regimen Envarsus 7 mg daily  - held dose on 7/10    Goals:  Therapeutic Drug Levels  Tacrolimus trough goal:  6-10 ng/mL    Additional Clinical Monitoring/Outcomes  Monitor renal function (SCr and urine output) and liver function (LFTs)  Monitor for signs/symptoms of adverse events (e.g., hyperglycemia, hyperkalemia, hypomagnesemia, hypertension, headache, tremor)    Results:   Tacrolimus level:  16.5 ng/mL, drawn appropriately    Pharmacokinetic Considerations and Significant Drug Interactions:  Concurrent hepatotoxic medications: None identified  Concurrent CYP3A4 substrates/inhibitors: None identified  Concurrent nephrotoxic medications: None identified    Assessment/Plan:  Recommendedation(s)  - hold Envarsus for another day, plan to restart once tac level reaches or is <10    Follow-up  Daily tac levels .   A pharmacist will continue to monitor and recommend levels as appropriate    Please page service pharmacist with questions/clarifications.    Vertis Kelch, PharmD

## 2021-09-29 NOTE — Unmapped (Cosign Needed)
Nephrology ([facility]) Progress Note    Assessment & Plan:   Carolyn Kelley is a 31 y.o. 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 coli urinary tract infection and AKI now improving.  Now floor status.    Principal Problem:    Acute kidney injury (CMS-HCC)  Active Problems:    UTI (urinary tract infection)    History of immunosuppression therapy    History of international travel    Leukopenia  Resolved Problems:    * No resolved hospital problems. *    Active Problems     Acute kidney injury - UTI - Metabolic acidosis  Urine microscopy notable for bacteria otherwise no significant findings. Given patient's persistent diarrhea causing dehydration and hypotension in clinic, etiology of AKI likely prerenal with concomitant infection.  Hypotension is now resolved.  Urine culture positive for E. Coli. RPP -. Previously had reported some diarrhea, now improving.  - Appreciate ICID input  - Follow-up urine and blood cultures  - Follow-up C diff assay, HIV, GC NAA urine, GI pathogen panel.  CMV PCR was canceled. Re-order if diarrhea worsens.   - Discontinued Daptomcyin and pip/tazo (7/7-7/9 )  - Continue 14-day course ciprofloxacin 500 qd (7/9-7/20), renal dosing  - Check CK weekly; LFTs weekly    Leukopenia  Neutropenia and lymphopenia in setting of chronic immunosuppressive use.  -Trend daily CBC with differential for now and adjust Myfortic dose as appropriate     S/P deceased donor kidney transplant with C3 glomerulopathy recurrence - Immunosuppression  Kidney biopsy 12/29/19 confirmed presence of C3 glomerulopathy recurrence. It is possible she has a flare of C3 glomerulopathy or rejection though these seem less likely.  Also Consider kidney biopsy if suspicion increases.   - Restarted home Myfortic at half dose (360mg  BID); home dose is 720mg  BID  - Trend Envarsus levels. Holding a.m. tacrolimus dose 7/10 given elevated trough today.   - Hold aspirin   - Continue home prednisone     Chronic Problems  Seizures Disorder, no seizures since 2014:    EEG with no evidence of seizures on 06/25/20. Keppra taper is being initiated by Neurology team. She will follow up with [name], NP at [facility].        Myoclonus:   - Continue home Klonopin    Issues Impacting Complexity of Management:  -Intensive monitoring of drug toxicity from Tacrolimus with scheduled levels    Daily Checklist:  Diet: Regular Diet  DVT PPx: Heparin 5000units q8h  Electrolytes: Replete Potassium to >/= 3.6 and Magnesium to >/= 1.8  Code Status: Full Code  Dispo: Home, pending PT/OT reccs    Team Contact Information:   Primary Team: Nephrology ([facility])  Primary Resident: Jeannetta Nap, MD  Resident's Pager: [phone] (Nephrology Intern - Alvester Morin)    Interval History:   No acute events overnight. She had no acute complaints today. Her Cr has improved.  Remains leukopenic, Myfortic dose may need to be adjusted.  Tacrolimus level was elevated will hold tomorrow's morning dose.  Otherwise stable for floor status.  Patient remaining on PO ciprofloxacin course per ICID req. Tolerates PO well, no bowel movements yet    All other systems were reviewed and are negative except as noted in the HPI    Objective:   Temp:  [36.5 ??C (97.7 ??F)-36.8 ??C (98.2 ??F)] 36.8 ??C (98.2 ??  F)  Heart Rate:  [79-81] 81  Resp:  [19-20] 19  BP: (133-138)/(84-96) 138/96  SpO2:  [96 %-100 %] 99 %,   Intake/Output Summary (Last 24 hours) at 09/29/2021 1406  Last data filed at 09/29/2021 1145  Gross per 24 hour   Intake --   Output 400 ml   Net -400 ml         Gen: Soft-spoken female in no acute distress lying on side. Has a purple walker by bedside.   HENT: atraumatic, normocephalic  Heart: RRR  Lungs: CTAB, no crackles or wheezes  Abdomen: Nondistended  Extremities: No edema, wwp

## 2021-09-29 NOTE — Unmapped (Signed)
PHYSICAL THERAPY  Evaluation (09/29/21 0842)          Patient Name:  Carolyn Kelley       Medical Record Number: [id number]   Date of Birth: Sep 05, 1990  Sex: Female        Treatment Diagnosis: Generalized deconditioning, decreased endurance     Activity Tolerance: Tolerated treatment well     ASSESSMENT  Problem List: Decreased endurance, Fall risk, Impaired ADLs, Decreased mobility      Carolyn Kelley is a 31 y.o. 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 coli urinary tract infection and AKI now improving.  Now floor status.    Assessment: Pt presents to acute PT with the above deficits and PMH. Pt is currently functioning near her baseline level of mobility. She is able to ambulate functional distances with no more than SBA and RW. Pt educated on continued mobility efforts with CNA/RN assist while acute. From a mobility standpoint pt currently has no skilled acute PT needs at this time. After a review of the personal factors, comorbidities, clinical presentation, and examination of the number of affected body systems, the patient presents as a low complexity case. Given CLOF, recommend 3x/weekly post acute PT services upon discharge.        Today's Interventions: PT Eval; Functional mobiltiy as performed below; Pt education re: PT POC, role of acute PT, safety awareness, OOB/ambulating with staff assist, proper use of DME and appropropriate DME for pts needs (pt declining use of rollator 2/2 not liking the way it looks), fall prevention, activity modification, and discharge recs.      AM-PAC-6 click    Difficulty turning over In bed?: None - Modified Independent/Independent  Difficulty sitting down/standing up from chair with arms? : A Little - Minimal/Contact Guard Assist/Supervision  Difficulty moving from supine to sitting on edge of bed?: None - Modified Independent/Independent  Help moving to and from bed from wheelchair?: A Little - Minimal/Contact Guard Assist/Supervision  Help currently needed walking in a hospital room?: A Little - Minimal/Contact Guard Assist/Supervision  Help currently needed climbing 3-5 steps with railing?: A Little - Minimal/Contact Guard Assist/Supervision    Basic Mobility Score:  Basic Mobility Score 6 click: 20    6 click  Score (in points): % of Functional Impairment, Limitation, Restriction  6: 100% impaired, limited, restricted  7-8: At least 80%, but less than 100% impaired, limited restricted  9-13: At least 60%, but less than 80% impaired, limited restricted  14-19: At least 40%, but less than 60% impaired, limited restricted  20-22: At least 20%, but less than 40% impaired, limited restricted  23: At least 1%, but less than 20% impaired, limited restricted  24: 0% impaired, limited restricted        PLAN  Planned Frequency of Treatment:  D/C Services for: D/C Services       Post-Discharge Physical Therapy Recommendations:  3x weekly     PT DME Recommendations: Rolling walker (pt owns own RW however needs a new one as hers is currently not adjustable)            Goals:   Patient and Family Goals: To get back to her normal work out routine        Prognosis:  Good  Positive Indicators: age, PLOF, family support  Barriers to Discharge: None     SUBJECTIVE  Patient reports: Pt agreeable to PT  Current Functional Status: Pt received semi-reclined and left in same position with all needs met, call bell in reach, and RN updated.     Prior Functional Status: Pt reported being completely IND PTA with RW and SPC. Pt reported that she is in school studying psychology. Reported being IND with all ADLs and exercising in her apartment gym sunday - thursdays.  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     Chronic kidney disease     Depressive disorder, not elsewhere classified     Migraine with aura, without mention of intractable migraine without mention of status migrainosus     Seizures (CMS-HCC)     Unspecified vitamin B deficiency             Social History     Tobacco Use    Smoking status: Never    Smokeless tobacco: Never   Substance Use Topics    Alcohol use: No       Past Surgical History:   Procedure Laterality Date    AV FISTULA PLACEMENT Left     PR TRANSPLANT,PREP CADAVER RENAL GRAFT N/A 03/10/2019    Procedure: BACKBNCH STD PREP CAD DONR RENAL ALLOGFT PRIOR TO TRNSPLNT, INCL DISSEC/REM PERINEPH FAT, DIAPH/RTPER ATTAC;  Surgeon: Leona Carry, MD;  Location: MAIN OR [place];  Service: Transplant    PR TRANSPLANTATION OF KIDNEY N/A 03/10/2019    Procedure: RENAL ALLOTRANSPLANTATION, IMPLANTATION OF GRAFT; WITHOUT RECIPIENT NEPHRECTOMY;  Surgeon: Leona Carry, MD;  Location: MAIN [facility];  Service: Transplant             Family History   Problem Relation Age of Onset    Migraines Mother     Diabetes Father     Hypertension Father     Seizures Brother     Diabetes Maternal Grandmother     Hypertension Maternal Grandmother     Heart attack Maternal Grandmother     Melanoma Neg Hx     Basal cell carcinoma Neg Hx     Squamous cell carcinoma Neg Hx     Cancer Neg Hx         Allergies: Bee pollen, Cefepime, and Pollen extracts                  Objective Findings  Precautions / Restrictions  Precautions: Falls precautions  Weight Bearing Status: Non-applicable  Required Braces or Orthoses: Non-applicable     Communication Preference: Verbal          Pain Comments: Denied pain  Medical Tests / Procedures: chart reviewed  Equipment / Environment: Vascular access (PIV, TLC, Port-a-cath, PICC), Telemetry     At Rest: VSS  With Activity: NAD  Orthostatics: asymptomatic        Living Situation  Living Environment: Apartment  Lives With: Family (mother, father, brother)  Home Living: One level home, Tub bench, Standard height toilet (steep hill to enter)      Cognition: WFL  Cognition comment: pt demos increased time needed for processing and answering questions, also demos difficulty with recall t/o session        Skin Inspection: Intact where visualized     Upper Extremities  UE ROM: Right WFL, Left [facility]  UE Strength: Right WFL, Left WFL    Lower Extremities  LE ROM: Right WFL, Left WFL  LE Strength: Left WFL, Right WFL     Coordination: Impaired  Coordination comment: BLE tremors at baseline, BUE tremors also noted t/o session  Sensation: Impaired, Tingling, Numbness  Balance: Standing balance (needs UE support), Impaired dynamic standing balance  Balance comment: IND EOB seated balance, requires BUE support on RW for standing balance      Bed Mobility: Supine to Sit  Supine to Sit assistance level: Independent  Bed Mobility: supine <> sit IND with HOB slightly elevated     Transfers: Sit to Stand  Sit to Stand assistance level: Standby assist, set-up cues, supervision of patient - no hands on  Transfer comments: sit <> stand from EOB SBA with RW      Gait Level of Assistance: Standby assist, set-up cues, supervision of patient - no hands on  Gait Assistive Device: Front wheel walker  Gait Distance Ambulated (ft): 120 ft  Gait: ambulated 120' with SBA and RW, demos slow and steady gait t/o, pt lifting RW up off ground during all turns (reported she does not like the sound of the RW when making turns), mild tremulous LE activity noted however no overt LOB or knee buckling noted                  Endurance: decreased from baseline     Physical Therapy Session Duration  PT Individual [mins]: 28     Medical Staff Made Aware: RN aware     I attest that I have reviewed the above information.  Signed: Dessa Phi, PT  [facility] 09/29/2021

## 2021-09-29 NOTE — Unmapped (Signed)
Pt arrived from [facility] end of shift. VS is done. Pt is stable staying with family.

## 2021-09-29 NOTE — Unmapped (Signed)
Patient is alert and oriented,denies pain,vital signs stable.Input and out put monitored,patient ambulates with a rolling walker with minimal assist.Continuous pulse ox in place.Patient remained free from falls and injury this shiftnbed in low locked position,call bell within reach.   Problem: Adult Inpatient Plan of Care  Goal: Plan of Care Review  Outcome: Progressing  Goal: Patient-Specific Goal (Individualized)  Outcome: Progressing  Goal: Absence of Hospital-Acquired Illness or Injury  Outcome: Progressing  Intervention: Identify and Manage Fall Risk  Recent Flowsheet Documentation  Taken 09/28/2021 2000 by Hughes Better, RN  Safety Interventions:   bed alarm   fall reduction program maintained   low bed  Intervention: Prevent Skin Injury  Recent Flowsheet Documentation  Taken 09/28/2021 2000 by Hughes Better, RN  Skin Protection: adhesive use limited  Intervention: Prevent and Manage VTE (Venous Thromboembolism) Risk  Recent Flowsheet Documentation  Taken 09/28/2021 2000 by Hughes Better, RN  VTE Prevention/Management: anticoagulant therapy  Intervention: Prevent Infection  Recent Flowsheet Documentation  Taken 09/28/2021 2000 by Hughes Better, RN  Infection Prevention: hand hygiene promoted  Goal: Optimal Comfort and Wellbeing  Outcome: Progressing  Goal: Readiness for Transition of Care  Outcome: Progressing  Goal: Rounds/Family Conference  Outcome: Progressing

## 2021-09-30 LAB — CBC W/ AUTO DIFF
BASOPHILS ABSOLUTE COUNT: 0 10*9/L (ref 0.0–0.1)
BASOPHILS RELATIVE PERCENT: 1.3 %
EOSINOPHILS ABSOLUTE COUNT: 0.1 10*9/L (ref 0.0–0.5)
EOSINOPHILS RELATIVE PERCENT: 3.5 %
HEMATOCRIT: 29.7 % — ABNORMAL LOW (ref 34.0–44.0)
HEMOGLOBIN: 10.1 g/dL — ABNORMAL LOW (ref 11.3–14.9)
LYMPHOCYTES ABSOLUTE COUNT: 1.1 10*9/L (ref 1.1–3.6)
LYMPHOCYTES RELATIVE PERCENT: 37.8 %
MEAN CORPUSCULAR HEMOGLOBIN CONC: 34.1 g/dL (ref 32.0–36.0)
MEAN CORPUSCULAR HEMOGLOBIN: 30.3 pg (ref 25.9–32.4)
MEAN CORPUSCULAR VOLUME: 88.7 fL (ref 77.6–95.7)
MEAN PLATELET VOLUME: 7.7 fL (ref 6.8–10.7)
MONOCYTES ABSOLUTE COUNT: 0.7 10*9/L (ref 0.3–0.8)
MONOCYTES RELATIVE PERCENT: 21.4 %
NEUTROPHILS ABSOLUTE COUNT: 1.1 10*9/L — ABNORMAL LOW (ref 1.8–7.8)
NEUTROPHILS RELATIVE PERCENT: 36 %
PLATELET COUNT: 386 10*9/L (ref 150–450)
RED BLOOD CELL COUNT: 3.35 10*12/L — ABNORMAL LOW (ref 3.95–5.13)
RED CELL DISTRIBUTION WIDTH: 13.6 % (ref 12.2–15.2)
WBC ADJUSTED: 3 10*9/L — ABNORMAL LOW (ref 3.6–11.2)

## 2021-09-30 LAB — RENAL FUNCTION PANEL
ALBUMIN: 3 g/dL — ABNORMAL LOW (ref 3.4–5.0)
ANION GAP: 8 mmol/L (ref 5–14)
BLOOD UREA NITROGEN: 54 mg/dL — ABNORMAL HIGH (ref 9–23)
BUN / CREAT RATIO: 26
CALCIUM: 10.2 mg/dL (ref 8.7–10.4)
CHLORIDE: 111 mmol/L — ABNORMAL HIGH (ref 98–107)
CO2: 22 mmol/L (ref 20.0–31.0)
CREATININE: 2.04 mg/dL — ABNORMAL HIGH
EGFR CKD-EPI (2021) FEMALE: 33 mL/min/{1.73_m2} — ABNORMAL LOW (ref >=60)
GLUCOSE RANDOM: 98 mg/dL (ref 70–179)
PHOSPHORUS: 3 mg/dL (ref 2.4–5.1)
POTASSIUM: 5 mmol/L — ABNORMAL HIGH (ref 3.4–4.8)
SODIUM: 141 mmol/L (ref 135–145)

## 2021-09-30 LAB — MAGNESIUM: MAGNESIUM: 1.6 mg/dL (ref 1.6–2.6)

## 2021-09-30 LAB — BK VIRUS QUANTITATIVE PCR, BLOOD: BK BLOOD RESULT: NOT DETECTED

## 2021-09-30 LAB — SLIDE REVIEW

## 2021-09-30 LAB — TACROLIMUS LEVEL, TROUGH: TACROLIMUS, TROUGH: 7 ng/mL (ref 5.0–15.0)

## 2021-09-30 MED ORDER — CIPROFLOXACIN 500 MG TABLET
ORAL_TABLET | Freq: Two times a day (BID) | ORAL | 0 refills | 10 days | Status: CP
Start: 2021-09-30 — End: 2021-10-10
  Filled 2021-09-30: qty 20, 10d supply, fill #0

## 2021-09-30 MED ADMIN — ciprofloxacin HCl (CIPRO) tablet 500 mg: 500 mg | ORAL | @ 09:00:00 | Stop: 2021-09-30

## 2021-09-30 MED ADMIN — mycophenolate (MYFORTIC) EC tablet 720 mg: 720 mg | ORAL | @ 14:00:00 | Stop: 2021-09-30

## 2021-09-30 MED ADMIN — clonazePAM (KlonoPIN) tablet 0.5 mg: .5 mg | ORAL | @ 01:00:00

## 2021-09-30 MED ADMIN — levETIRAcetam (KEPPRA) tablet 250 mg: 250 mg | ORAL | @ 14:00:00 | Stop: 2021-09-30

## 2021-09-30 MED ADMIN — predniSONE (DELTASONE) tablet 5 mg: 5 mg | ORAL | @ 14:00:00 | Stop: 2021-09-30

## 2021-09-30 MED ADMIN — sodium bicarbonate tablet 650 mg: 650 mg | ORAL | @ 01:00:00

## 2021-09-30 MED ADMIN — heparin (porcine) 5,000 unit/mL injection 5,000 Units: 5000 [IU] | SUBCUTANEOUS | @ 01:00:00

## 2021-09-30 MED ADMIN — cholecalciferol (vitamin D3 25 mcg (1,000 units)) tablet 125 mcg: 125 ug | ORAL | @ 14:00:00 | Stop: 2021-09-30

## 2021-09-30 MED ADMIN — tacrolimus (ENVARSUS XR) extended release tablet 7 mg: 7 mg | ORAL | @ 17:00:00 | Stop: 2021-09-30

## 2021-09-30 MED ADMIN — levETIRAcetam (KEPPRA) tablet 250 mg: 250 mg | ORAL | @ 01:00:00

## 2021-09-30 MED ADMIN — clonazePAM (KlonoPIN) tablet 0.5 mg: .5 mg | ORAL | @ 14:00:00 | Stop: 2021-09-30

## 2021-09-30 MED ADMIN — sodium bicarbonate tablet 650 mg: 650 mg | ORAL | @ 14:00:00 | Stop: 2021-09-30

## 2021-09-30 MED ADMIN — mycophenolate (MYFORTIC) EC tablet 720 mg: 720 mg | ORAL | @ 01:00:00

## 2021-09-30 MED ADMIN — heparin (porcine) 5,000 unit/mL injection 5,000 Units: 5000 [IU] | SUBCUTANEOUS | @ 09:00:00 | Stop: 2021-09-30

## 2021-09-30 NOTE — Unmapped (Signed)
Tacrolimus Therapeutic Monitoring Pharmacy Note    Carolyn Kelley is a 31 y.o. female continuing tacrolimus.     Indication: Kidney transplant     Date of Transplant:  02/2019       Prior Dosing Information: Home regimen Envarsus 7 mg PO daily, held since 7/9      Goals:  Therapeutic Drug Levels  Tacrolimus trough goal:  6-10 ng/mL    Additional Clinical Monitoring/Outcomes  Monitor renal function (SCr and urine output) and liver function (LFTs)  Monitor for signs/symptoms of adverse events (e.g., hyperglycemia, hyperkalemia, hypomagnesemia, hypertension, headache, tremor)    Results:   Tacrolimus level:  7.0 ng/mL, drawn appropriately    Pharmacokinetic Considerations and Significant Drug Interactions:  Concurrent hepatotoxic medications: None identified  Concurrent CYP3A4 substrates/inhibitors:  prednisone (substrate)  Concurrent nephrotoxic medications: None identified    Assessment/Plan:  Recommendedation(s)  Re-start home regimen of Envarsus XR 7 mg daily per discussion with transplant nephrology attending    Follow-up  Plan for discharge today. Plan for repeat level within 1 week.  A pharmacist will continue to monitor and recommend levels as appropriate    Please page service pharmacist with questions/clarifications.    Karmen Stabs, PharmD Candidate    Rance Muir, PharmD, BCPS  Acute Care Clinical Pharmacist

## 2021-09-30 NOTE — Unmapped (Signed)
[facility]  at [phone]     Home health physical and occupational therapy have been arranged through [facility]. They should see you within 48 hours of discharge. Please call them if you do not hear from them.

## 2021-09-30 NOTE — Unmapped (Signed)
Pt is alert and oriented x 4. Pt remains free from falls this shift. Bed lowered and locked with call bell in reach. Pt denies pain this shift. PT IV removed this shift as preparation for discharge. Pt belongings have been gathered. [facility] nurse will discharge pt.   Problem: Adult Inpatient Plan of Care  Goal: Plan of Care Review  Outcome: Progressing  Goal: Patient-Specific Goal (Individualized)  Outcome: Progressing  Goal: Absence of Hospital-Acquired Illness or Injury  Outcome: Progressing  Intervention: Identify and Manage Fall Risk  Recent Flowsheet Documentation  Taken 09/30/2021 0701 by Vianne Bulls, RN  Safety Interventions:   bed alarm   lighting adjusted for tasks/safety   low bed   nonskid shoes/slippers when out of bed  Intervention: Prevent Skin Injury  Recent Flowsheet Documentation  Taken 09/30/2021 0701 by Vianne Bulls, RN  Skin Protection: adhesive use limited  Intervention: Prevent and Manage VTE (Venous Thromboembolism) Risk  Recent Flowsheet Documentation  Taken 09/30/2021 0942 by Vianne Bulls, RN  VTE Prevention/Management: anticoagulant therapy  Taken 09/30/2021 0701 by Vianne Bulls, RN  Activity Management: activity adjusted per tolerance  Intervention: Prevent Infection  Recent Flowsheet Documentation  Taken 09/30/2021 0701 by Vianne Bulls, RN  Infection Prevention: hand hygiene promoted  Goal: Optimal Comfort and Wellbeing  Outcome: Progressing  Goal: Readiness for Transition of Care  Outcome: Progressing  Goal: Rounds/Family Conference  Outcome: Progressing

## 2021-09-30 NOTE — Unmapped (Signed)
IMMUNOCOMPROMISED HOST INFECTIOUS DISEASE PROGRESS NOTE    Assessment/Plan:     Carolyn Kelley is a 31 y.o. 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     Counseled on interventions to reduce risk of UTI, and encouraged to schedule ID clinic follow up if interested in exploring more options for reducing risk of UTI, or if she has recurrent UTI.    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  - Serologies: CMV D+/R+, EBV D+/R+, Toxo D?/R-  - Induction: Campath  - Surgical complications: hypotension and hypoxia thought to be caused by an anaphylactic reaction to Campath  - Prophylaxis prior to admission: None  - History of rejection if any: None  - Immunosuppression: Myfortic 720 mg bid (increased due to C3 glomerulonephropathy recurrence), prednisone 5 mg daily (restarted due to C3 glomerulonephropathy recurrence), Envarsus 7 mg daily (target trough is 6-10 ng/mL)     Pertinent comorbidities:  Seizure disorder  Pseudoaneurysm of AVF   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     Pertinent exposures:  Recent travel to [place] (6/15-6/29/23)     Summary of pertinent prior infections:  None     Active infection:  #Sepsis 2/2 E coli UTI, 09/26/21  - History of recent travel to [place], and presented on 7/7 with ~1 week of fatigue, dysuria, diarrhea (resolved), and lower abdominal pain, found to be hypotensive and tachycardic  - 7/7 CXR without acute abnormalities, RPP negative, UA with squams, serum CMV <50, RPR nonreactive, GIPP and C diff negative  - 7/7 Ucx x2, 100 k E coli (Pan-susceptible), 50-100k E coli w/ 10 - 50K UGF  - 7/7 BK negative  - 7/8 HIV negative  Rx: 7/7 pip/tazo -> 7/9 cipro ->       Antimicrobial allergy/intolerance:  Cefepime - facial rash         RECOMMENDATIONS    Diagnosis  Follow up 7/7 BCx to completion    Management  Continue ciprofloxacin, increase to 500 mg BID (given improvement in renal function) to complete 14 day course (7/7-7/21)    Antimicrobial prophylaxis required for transplant immunosuppression   None    Intensive toxicity monitoring for prescription antimicrobials   CBC w/diff at least once per week  CMP at least once per week  Counseled re: tendonitis    The [facility] ID service will sign off. No outpatient ID follow up anticipated.  Patient encouraged to schedule follow up with ID in the event of recurrent UTI.          Please page the ID Transplant/Liquid Oncology Fellow consult at [phone] with questions.  Patient discussed with Dr. Reynold Bowen.    Althia Forts, MD  [facility] Division of Infectious Diseases      Attending attestation    I saw and evaluated the patient, participating in the key portions of the service.  I reviewed the resident???s note.  I agree with the resident???s findings and plan.     Darlen Round, MD  Division of Infectious Diseases         Subjective:     External record(s): Primary team note: discharging to complete course of ciprofloxacin .    Independent historian(s): no independent historian required.       Interval History:   Afebrile, no acute  events overnight.  Feeling well today, with no dysuria, subjective fevers, chills, diarrhea.    Medications:  Current Medications as of 09/30/2021  Scheduled  PRN   cholecalciferol (vitamin D3-125 mcg (5,000 unit)), 125 mcg, Daily  ciprofloxacin HCl, 500 mg, daily  clonazePAM, 0.5 mg, BID  heparin (porcine) for subcutaneous use, 5,000 Units, Q8H SCH  levETIRAcetam, 250 mg, BID  mycophenolate, 720 mg, BID  predniSONE, 5 mg, Daily  sodium bicarbonate, 650 mg, BID      acetaminophen, 650 mg, Q6H PRN  melatonin, 3 mg, Nightly PRN  ondansetron, 4 mg, Q8H PRN   Or  ondansetron, 4 mg, Q8H PRN         Objective:     Vital Signs last 24 hours:  Temp:  [36.8 ??C (98.2 ??F)-36.9 ??C (98.4 ??F)] 36.9 ??C (98.4 ??F)  Heart Rate:  [74-82] 82  Resp:  [19-20] 19  BP: (128-138)/(90-96) 128/91  MAP (mmHg):  [101-110] 101  SpO2:  [96 %-100 %] 100 %    Physical Exam:   Patient Lines/Drains/Airways Status       Active Active Lines, Drains, & Airways       Name Placement date Placement time Site Days    Peripheral IV 09/26/21 Right Antecubital 09/26/21  1045  Antecubital  3    Arteriovenous Fistula - Vein Graft  Access Arteriovenous fistula Left;Upper Arm --  --  Arm  --                  Const [x]  vital signs above    [x]  NAD, non-toxic appearance []  Chronically ill-appearing, non-distressed        Eyes [x]  Lids normal bilaterally, conjunctiva anicteric and noninjected OU     [] PERRL  [] EOMI        ENMT [x]  Normal appearance of external nose and ears, no nasal discharge        []  MMM, no lesions on lips or gums []  No thrush, leukoplakia, oral lesions  []  Dentition good []  Edentulous []  Dental caries present  []  Hearing normal  []  TMs with good light reflexes bilaterally   Cracked lips      Neck [x]  Neck of normal appearance and trachea midline        []  No thyromegaly, nodules, or tenderness   []  Full neck ROM        Lymph [x]  No LAD in neck     []  No LAD in supraclavicular area     []  No LAD in axillae   []  No LAD in epitrochlear chains     []  No LAD in inguinal areas        CV [x]  RRR            []  No peripheral edema     []  Pedal pulses intact   []  No abnormal heart sounds appreciated   []  Extremities WWP         Resp [x]  Normal WOB at rest    []  No breathlessness with speaking, no coughing  []  CTA anteriorly    [x]  CTA posteriorly          GI [x]  Normal inspection, NTND   [x]  NABS     []  No umbilical hernia on exam       []  No hepatosplenomegaly     []  Inspection of perineal and perianal areas normal        GU []  Normal external genitalia     [x] No urinary catheter present in urethra   []   No CVA tenderness    [x]  No tenderness over renal allograft  No suprapubic TTP      MSK [x]  No clubbing or cyanosis of hands       []  No vertebral point tenderness  []  No focal tenderness or abnormalities on palpation of joints in RUE, LUE, RLE, or LLE        Skin [x]  No rashes, lesions, or ulcers of visualized skin     [x]  Skin warm and dry to palpation         Neuro [x]  Face expression symmetric  []  Sensation to light touch grossly intact throughout    [x]  Moves extremities equally    []  No tremor noted        []  CNs II-XII grossly intact     []  DTRs normal and symmetric throughout []  Gait unremarkable        Psych [x]  Appropriate affect       []  Fluent speech         [x]  Attentive, good eye contact  []  Oriented to person, place, time          []  Judgment and insight are appropriate   Slightly slow response to questions        Data for Medical Decision Making     (09/26/21) EKG QTcF 411 ms     I discussed mgm't w/qualified health care professional(s) involved in case: increasing ciprofloxacin dosing with primary team and drug options and/or interactions w/pharmD ciprofloxacin dosing .    I reviewed CBC results (WBC low at 3.0, Plts normal at 386), chemistry results (Cr improving at 2.04) and HIV negative, gonorrhea and chlamydia negative .    I independently visualized/interpreted not done.       Recent Labs   Lab Units 09/30/21  0445 09/29/21  0558 09/28/21  0556 09/27/21  0550   WBC 10*9/L 3.0* 2.5*   < > 2.0*   HEMOGLOBIN g/dL 16.1* 09.6*   < > 9.8*   PLATELET COUNT (1) 10*9/L 386 356   < > 273   NEUTRO ABS 10*9/L 1.1* 0.7*   < > 1.1*   LYMPHO ABS 10*9/L 1.1 1.0*  --  0.4*   EOSINO ABS 10*9/L 0.1 0.1  --  0.0   BUN mg/dL 54* 65*   < > 98*   CREATININE mg/dL 0.45* 4.09*   < > 8.11*   AST U/L  --  36*  --   --    ALT U/L  --  57*  --   --    BILIRUBIN TOTAL mg/dL  --  0.5  --   --    ALK PHOS U/L  --  55  --   --    POTASSIUM mmol/L 5.0* 4.8   < > 4.4   MAGNESIUM mg/dL 1.6 1.8   < > 2.3   PHOSPHORUS mg/dL 3.0 3.4   < > 6.1*   CALCIUM mg/dL 91.4 9.5   < > 9.5   CK TOTAL U/L  --   --   --  26.0*    < > = values in this interval not displayed.       Microbiology:  Microbiology Results (last day)       Procedure Component Value Date/Time Date/Time    GI Pathogen Panel [[id number]]  (Normal) Collected: 09/28/21 0640    Lab Status: Final result Specimen: Stool  Updated: 09/28/21 1512     Giardia Negative     Cryptosporidium Negative  E. coli: O157 Negative     E. coli: Enterotoxigenic (ETEC) Negative     E. coli: Shiga-toxin (STEC) Negative     Salmonella Negative     Shigella Negative     Campylobacter Negative     Comment: Detects C. jejuni, C. coli and C. lari.        Norovirus Negative     Rotavirus Negative    Narrative:      Test performed using Lum[employer] xTAG Gastrointestinal Pathogen Panel, an FDA-cleared qualitative molecular multiplex test. Positive results for Campylobacter, E. coli O157, shiga toxin producing E. coli (STEC), Salmonella, Shigella and Cryptosporidium must be reported by both the Physician and Laboratory to the [facility], Division of Epidemiology.    C. Difficile Assay [[id number]]  (Normal) Collected: 09/28/21 0640    Lab Status: Final result Specimen: Stool  Updated: 09/28/21 1450     C. Diff Result Negative     Comment: Clostridium difficile NOT detected       Narrative:      The methodology of this test detects C. difficile toxin A and/or toxin B, by EIA.        Blood Culture [[id number]]  (Normal) Collected: 09/26/21 1120    Lab Status: Preliminary result Specimen: Blood from 1 Peripheral Draw Updated: 09/28/21 1215     Blood Culture, Routine No Growth at 48 hours    Blood Culture [[id number]]  (Normal) Collected: 09/26/21 1058    Lab Status: Preliminary result Specimen: Blood from 1 Peripheral Draw Updated: 09/28/21 1215     Blood Culture, Routine No Growth at 48 hours    Urine Culture [[id number]]  (Abnormal) Collected: 09/26/21 1316    Lab Status: Final result Specimen: Urine from Clean Catch Updated: 09/28/21 0819     Urine Culture, Comprehensive 50,000 to 100,000 CFU/mL Escherichia coli     Comment: Susceptibility performed on Previous Isolate - 09/26/2021         10,000 to 50,000 CFU/mL Mixed Urogenital Flora    Narrative:      Specimen Source: Clean Catch    Urine Culture [[id number]]  (Normal) Collected: 09/26/21 2210    Lab Status: Final result Specimen: Urine from Clean Catch Updated: 09/28/21 0802     Urine Culture, Comprehensive NO GROWTH    Narrative:      Specimen Source: Clean Catch    Chlamydia/Gonorrhoeae NAA [[id number]] Collected: 09/28/21 0015    Lab Status: In process Specimen: Urine Updated: 09/28/21 0025            Imaging:  ECG 12 Lead    Result Date: 09/28/2021  NORMAL SINUS RHYTHM MINIMAL VOLTAGE CRITERIA FOR LVH, MAY BE NORMAL VARIANT ( R in aVL ) WHEN COMPARED WITH ECG OF 26-Sep-2021 10:47, NO SIGNIFICANT CHANGE WAS FOUND Confirmed by Schuyler Amor ([id number]) on 09/28/2021 9:01:20 PM

## 2021-09-30 NOTE — Unmapped (Cosign Needed)
Physician Discharge Summary [facility]  [address] [facility]  [address]  [place] [address]  Dept: [phone]  Loc: [phone]     Identifying Information:   Carolyn Kelley  [birth year]  [id number]    Primary Care Physician: [facility]     Code Status: Full Code    Admit Date: 09/26/2021    Discharge Date: 09/30/2021     Discharge To: Home with Home Health and/or PT/OT    Discharge Service: [facility] - Nephrology Floor Team (MED B Alvester Morin)     Discharge Attending Physician: Nelva Bush, MBBS    Discharge Diagnoses:   Principal Problem:    Acute kidney injury (CMS-HCC) POA: Unknown  Active Problems:    UTI (urinary tract infection) POA: Yes    History of immunosuppression therapy POA: Not Applicable    History of international travel POA: Unknown    Leukopenia POA: Unknown  Resolved Problems:    * No resolved hospital problems. *      Hospital Course:   Carolyn Kelley is a 31 y.o. 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 coli urinary tract infection and AKI.     Active Problems     Acute kidney injury - UTI - Metabolic acidosis  Urine microscopy on admission was notable for bacteria otherwise no significant findings. Given patient's persistent diarrhea causing dehydration and hypotension in clinic, it was felt that the etiology of her AKI was prerenal with concomitant infection. Urine culture was positive for E. Coli. Patient was initially treated with vancomycin and cefepime, however cefepime was discontinued due to allergic reaction resulting in urticarial rash on the face during infusion. The patient therefore was initially treated with pip/tazo and daptomycin, and transitioned to ciprofloxacin with plans to complete a 14-day antibiotic course (7/7-7/21) upon discharge.      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 Myfortic was initially held on admission in the setting of infection, and promptly restarted 7/8 at half home dose (360mg  BID) followed by resuming home dose 7/10. Tacrolimus was held during admission as levels were high - restarted upon discharge with plan to follow-up with her transplant nephrologist on 7/14 to recheck levels and adjust dosing. Aspirin was held. Home prednisone was continued.     Chronic Problems    Seizures Disorder, no seizures since 2014:  Home Keppra was continued during admission. Neuropsych referral was placed due to concerns from OT regarding cognitive skills     Myoclonus:   Home Klonopin was continued during admission.   The patient's hospital stay has been complicated by the following clinically significant conditions requiring additional evaluation and treatment or having a significant effect of this patient's care: - Chronic kidney disease POA requiring further investigation, treatment, or monitoring  - Hyperkalemia POA requiring further investigation, treatment, or monitoring  - Acidosis POA requiring further investigation, treatment, or monitoring  - Dehydration POA requiring further investigation, treatment, or monitoring             Outpatient Provider Follow Up Issues:   - Adjust tacrolimus dosing as necessary    Touchbase with Outpatient Provider:  Warm Handoff: Completed on 09/30/21 by Jeannetta Nap  (Intern) and Hetty Blend (Resident) via [facility] Message    Procedures:  None  ______________________________________________________________________  Discharge Medications:      Your Medication List        START taking these medications      ciprofloxacin HCl 500 MG tablet  Commonly known as: CIPRO  Take 1 tablet (500 mg total) by mouth Two (2) times a day for 10 days.            CONTINUE taking these medications acetaminophen 500 MG tablet  Commonly known as: TYLENOL  Take 1-2 tablets (500-1,000 mg total) by mouth every six (6) hours as needed for pain or fever (> 38C).     cholecalciferol (vitamin D3-125 mcg (5,000 unit)) 125 mcg (5,000 unit) capsule  Take 1 capsule (125 mcg total) by mouth daily.     clonazePAM 0.5 MG tablet  Commonly known as: KlonoPIN  Take 1 tablet (0.5 mg total) by mouth two (2) times a day.     ENVARSUS XR 1 mg Tb24 extended release tablet  Generic drug: tacrolimus  Take three (1mg ) tablets with one (4mg ) tablet by mouth daily for a total daily dose of 7 mg.     ENVARSUS XR 4 mg Tb24 extended release tablet  Generic drug: tacrolimus  Take one (4mg ) tablet with three (1mg ) tablets by mouth daily for a total daily dose of 7 mg.     levETIRAcetam 250 MG tablet  Commonly known as: KEPPRA  TAKE 1 TABLET BY MOUTH TWICE DAILY.     magnesium (amino acid chelate) 133 mg  Generic drug: magnesium oxide-Mg AA chelate  Take 2 tablets by mouth Two (2) times a day.     mycophenolate 180 MG EC tablet  Commonly known as: MYFORTIC  Take 4 tablets (720 mg total) by mouth Two (2) times a day. Adjust dose per medication card.     norethindrone-ethinyl estradiol 1 mg-20 mcg (21)/75 mg (7) per tablet  Commonly known as: JUNEL FE 1/20  Take 1 tablet by mouth daily.     predniSONE 5 MG tablet  Commonly known as: DELTASONE  Take 1 tablet (5 mg total) by mouth daily.     sodium bicarbonate 650 mg tablet  Take 1 tablet (650 mg total) by mouth Two (2) times a day.     tretinoin 0.025 % cream  Commonly known as: RETIN-A  Apply 1 application. topically nightly. Nightly as tolerated              Allergies:  Bee pollen, Cefepime, and Pollen extracts  ______________________________________________________________________  Pending Test Results:  Pending Labs       Order Current Status    Blood Culture Preliminary result    Blood Culture Preliminary result            Most Recent Labs:  All lab results last 24 hours -   Recent Results (from the past 24 hour(s))   Magnesium Level    Collection Time: 09/30/21  4:45 AM   Result Value Ref Range    Magnesium 1.6 1.6 - 2.6 mg/dL   Renal Function Panel    Collection Time: 09/30/21  4:45 AM   Result Value Ref Range    Sodium 141 135 - 145 mmol/L    Potassium 5.0 (H) 3.4 - 4.8 mmol/L    Chloride 111 (H) 98 - 107 mmol/L    CO2 22.0 20.0 - 31.0 mmol/L    Anion Gap 8 5 - 14 mmol/L    BUN 54 (H) 9 - 23 mg/dL    Creatinine 1.61 (H)  0.60 - 0.80 mg/dL    BUN/Creatinine Ratio 26     eGFR CKD-EPI (2021) Female 33 (L) >=60 mL/min/1.14m2    Glucose 98 70 - 179 mg/dL    Calcium 16.1 8.7 - 09.6 mg/dL    Phosphorus 3.0 2.4 - 5.1 mg/dL    Albumin 3.0 (L) 3.4 - 5.0 g/dL   Tacrolimus Level, Trough    Collection Time: 09/30/21  4:45 AM   Result Value Ref Range    Tacrolimus, Trough 7.0 5.0 - 15.0 ng/mL   CBC w/ Differential    Collection Time: 09/30/21  4:45 AM   Result Value Ref Range    WBC 3.0 (L) 3.6 - 11.2 10*9/L    RBC 3.35 (L) 3.95 - 5.13 10*12/L    HGB 10.1 (L) 11.3 - 14.9 g/dL    HCT 04.5 (L) 40.9 - 44.0 %    MCV 88.7 77.6 - 95.7 fL    MCH 30.3 25.9 - 32.4 pg    MCHC 34.1 32.0 - 36.0 g/dL    RDW 81.1 91.4 - 78.2 %    MPV 7.7 6.8 - 10.7 fL    Platelet 386 150 - 450 10*9/L    Neutrophils % 36.0 %    Lymphocytes % 37.8 %    Monocytes % 21.4 %    Eosinophils % 3.5 %    Basophils % 1.3 %    Absolute Neutrophils 1.1 (L) 1.8 - 7.8 10*9/L    Absolute Lymphocytes 1.1 1.1 - 3.6 10*9/L    Absolute Monocytes 0.7 0.3 - 0.8 10*9/L    Absolute Eosinophils 0.1 0.0 - 0.5 10*9/L    Absolute Basophils 0.0 0.0 - 0.1 10*9/L   Morphology Review    Collection Time: 09/30/21  4:45 AM   Result Value Ref Range    Smear Review Comments See Comment (A) Undefined    Dohle Bodies Present (A) Not Present    Toxic Granulation Present (A) Not Present       Relevant Studies/Radiology:  ECG 12 Lead    Result Date: 09/28/2021  NORMAL SINUS RHYTHM MINIMAL VOLTAGE CRITERIA FOR LVH, MAY BE NORMAL VARIANT ( R in aVL ) WHEN COMPARED WITH ECG OF 26-Sep-2021 10:47, NO SIGNIFICANT CHANGE WAS FOUND Confirmed by Schuyler Amor ([id number]) on 09/28/2021 9:01:20 PM    ECG 12 Lead    Result Date: 09/26/2021  NORMAL SINUS RHYTHM NORMAL ECG WHEN COMPARED WITH ECG OF 10-Mar-2019 05:20, NO SIGNIFICANT CHANGE WAS FOUND Confirmed by Christella Noa ([id number]) on 09/26/2021 11:14:34 AM    XR Chest Portable    Result Date: 09/26/2021  EXAM: XR CHEST PORTABLE DATE: 09/26/2021 11:02 AM ACCESSION: [id number] UN DICTATED: 09/26/2021 11:14 AM INTERPRETATION LOCATION: [facility] CLINICAL INDICATION: 31 years old Female with FEVER  TECHNIQUE: Single View AP Chest Radiograph. COMPARISON: Chest 03/29/2020 FINDINGS: Low lung volumes. Lungs are clear. No pleural effusion or pneumothorax. Unremarkable cardiomediastinal silhouette.     No acute abnormalities.    US Renal Transplant W Doppler    Result Date: 09/26/2021  EXAM: US RENAL TRANSPLANT W DOPPLER DATE: 09/26/2021 ACCESSION: [id number] UN DICTATED: 09/26/2021 11:46 AM INTERPRETATION LOCATION: [facility] CLINICAL INDICATION: 31 years old Female with kidney transplant COMPARISON: Renal transplant Doppler 04/26/2020 TECHNIQUE:  Ultrasound views of the renal transplant were obtained using gray scale and color and spectral Doppler imaging. Views of the urinary bladder were obtained using gray scale and limited color Doppler imaging. FINDINGS: TRANSPLANTED KIDNEY: The renal transplant was located in the right lower quadrant. Normal size  and echogenicity.  No solid masses or calculi. No perinephric collections identified. No hydronephrosis. VESSELS: - Perfusion: Using power Doppler, normal perfusion was seen throughout the renal parenchyma. - Resistive indices in the renal transplant are stable compared with prior examination. - Main renal artery/iliac artery: Patent - Main renal vein/iliac vein: Patent BLADDER: Unremarkable.     No significant change compared with prior study. Grossly stable resistive indices in the renal transplant arteries, borderline at the main renal artery anastomosis. Please see below for data measurements: Transplant location: RLQ Renal Transplant: Sagittal 12.8 cm; AP 5.8 cm; Transverse 5.7 cm Segmental artery superior resistive index: 0.72 Segmental artery mid resistive index: 0.66 Segmental artery inferior resistive index: 0.61 Previous resistive indices range of segmental arteries: 0.62-0.69 Main renal artery peak systolic velocity at anastomosis: 1.21 m/s Main renal artery hilum resistive index: 0.67 Main renal artery mid resistive index: 0.72 Main renal artery anastomosis resistive index: 0.82 Previous resistive indices range of main renal artery: 0.74-0.78 Main renal vein: patent Iliac artery: Patent Iliac vein: Patent Bladder volume prevoid: 58.8  mL    ______________________________________________________________________  Discharge Instructions:   Activity Instructions       Activity as tolerated            You were admitted to [facility] with acute kidney injury and urinary tract infection.   You are now ready to discharge and will be discharging to: Home with Home Health Services  Please take your medications as prescribed. Medication changes are listed below and a full list of medications is in this discharge packet. Please keep your follow-up appointments after the hospital for ongoing care. It has been a pleasure taking care of you, we wish you the best.   MEDICATION CHANGES: -- Finish taking ciprofloxacin 500mg  twice a day for 10 days, until 7/21 -- Resume all of your other home medications, and take them as previously  FOLLOW-UP: -- It is important to see your primary care provider (PCP) after being in the hospital. [facility] PCP is [facility] and the phone number of their office is [phone]. Please call your PCP's office as soon as possible to schedule an appointment with them.  -- Please follow-up with your transplant nephrologist on 7/14 at 1:30PM -- Please follow-up with Neuropsychology as recommended by Occupational Therapy. They will be in touch to schedule the appointment -- Please follow-up with [name] for potential adjustment of your seizure medications         Resources and Referrals        [facility]  at [phone]     Home health physical and occupational therapy have been arranged through [facility]. They should see you within 48 hours of discharge. Please call them if you do not hear from them.             Follow Up instructions and Outpatient Referrals     Ambulatory referral to Neuropsychology      Call MD for:  difficulty breathing, headache or visual disturbances      Call MD for:  persistent nausea or vomiting      Call MD for:  severe uncontrolled pain      Call MD for:  temperature >38.5 Celsius      Discharge instructions          Appointments which have been scheduled for you      Oct 09, 2021 11:00 AM  Appointment  [address], [place] [address]  Hospital follow up with Maud Deed, PA  --, on 10/09/21 @11 :00 AM  phone : [phone]     Oct 15, 2021  8:30 AM  (Arrive by 8:15 AM)  RETURN NEPHROLOGY POST with Randal Ewing Schlein, MD  [facility]ANS[facility] [place] [facility]) [address]  [place] [address]  [phone]             ______________________________________________________________________  Discharge Day Services:  BP 127/91  - Pulse 77  - Temp 36.7 ??C (98.1 ??F) (Oral)  - Resp 16  - Ht 157.5 cm (5' 2)  - Wt 69.9 kg (154 lb 1.6 oz)  - SpO2 100%  - BMI 28.19 kg/m??     Pt seen on the day of discharge and determined appropriate for discharge.    Condition at Discharge: fair    Length of Discharge: I spent greater than 30 mins in the discharge of this patient.

## 2021-09-30 NOTE — Unmapped (Signed)
Pt alert and oriented, VSS on RA. No c/o pain. Pt able to ambulate with walker, remained free from falls and injury this shift. Bed low and locked, call bell and side table within reach.  Problem: Adult Inpatient Plan of Care  Goal: Plan of Care Review  09/30/2021 0422 by Chauncy Passy, RN  Outcome: Progressing  09/30/2021 0422 by Chauncy Passy, RN  Outcome: Progressing  09/30/2021 0421 by Chauncy Passy, RN  Outcome: Progressing  09/30/2021 0420 by Chauncy Passy, RN  Outcome: Ongoing - Unchanged  Goal: Patient-Specific Goal (Individualized)  09/30/2021 0422 by Chauncy Passy, RN  Outcome: Progressing  09/30/2021 0422 by Chauncy Passy, RN  Outcome: Progressing  09/30/2021 0421 by Chauncy Passy, RN  Outcome: Progressing  09/30/2021 0420 by Chauncy Passy, RN  Outcome: Ongoing - Unchanged  Goal: Absence of Hospital-Acquired Illness or Injury  09/30/2021 0422 by Chauncy Passy, RN  Outcome: Progressing  09/30/2021 0422 by Chauncy Passy, RN  Outcome: Progressing  09/30/2021 0421 by Chauncy Passy, RN  Outcome: Progressing  09/30/2021 0420 by Chauncy Passy, RN  Outcome: Ongoing - Unchanged  Intervention: Identify and Manage Fall Risk  Recent Flowsheet Documentation  Taken 09/29/2021 1920 by Chauncy Passy, RN  Safety Interventions:   fall reduction program maintained   low bed  Intervention: Prevent Skin Injury  Recent Flowsheet Documentation  Taken 09/29/2021 1920 by Chauncy Passy, RN  Skin Protection: adhesive use limited  Intervention: Prevent and Manage VTE (Venous Thromboembolism) Risk  Recent Flowsheet Documentation  Taken 09/29/2021 2100 by Chauncy Passy, RN  VTE Prevention/Management: anticoagulant therapy  Taken 09/29/2021 1920 by Chauncy Passy, RN  Activity Management: activity adjusted per tolerance  Intervention: Prevent Infection  Recent Flowsheet Documentation  Taken 09/29/2021 1920 by Chauncy Passy, RN  Infection Prevention: hand hygiene promoted  Goal: Optimal Comfort and Wellbeing  09/30/2021 0422 by Chauncy Passy, RN  Outcome: Progressing  09/30/2021 0422 by Chauncy Passy, RN  Outcome: Progressing  09/30/2021 0421 by Chauncy Passy, RN  Outcome: Progressing  09/30/2021 0420 by Chauncy Passy, RN  Outcome: Ongoing - Unchanged  Goal: Readiness for Transition of Care  09/30/2021 0422 by Chauncy Passy, RN  Outcome: Progressing  09/30/2021 0422 by Chauncy Passy, RN  Outcome: Progressing  09/30/2021 0421 by Chauncy Passy, RN  Outcome: Progressing  09/30/2021 0420 by Chauncy Passy, RN  Outcome: Ongoing - Unchanged  Goal: Rounds/Family Conference  09/30/2021 0422 by Chauncy Passy, RN  Outcome: Progressing  09/30/2021 0422 by Chauncy Passy, RN  Outcome: Progressing  09/30/2021 0421 by Chauncy Passy, RN  Outcome: Progressing  09/30/2021 0420 by Chauncy Passy, RN  Outcome: Ongoing - Unchanged     Problem: Impaired Wound Healing  Goal: Optimal Wound Healing  09/30/2021 0422 by Chauncy Passy, RN  Outcome: Progressing  09/30/2021 0422 by Chauncy Passy, RN  Outcome: Progressing  09/30/2021 0421 by Chauncy Passy, RN  Outcome: Progressing  09/30/2021 0420 by Chauncy Passy, RN  Outcome: Ongoing - Unchanged  Intervention: Promote Wound Healing  Recent Flowsheet Documentation  Taken 09/29/2021 1920 by Chauncy Passy, RN  Activity Management: activity adjusted per tolerance     Problem: Infection  Goal: Absence of Infection Signs and Symptoms  09/30/2021 0422 by Chauncy Passy, RN  Outcome: Progressing  09/30/2021 0422 by Chauncy Passy, RN  Outcome: Progressing  09/30/2021 0421  by Chauncy Passy, RN  Outcome: Progressing  09/30/2021 0420 by Chauncy Passy, RN  Outcome: Ongoing - Unchanged  Intervention: Prevent or Manage Infection  Recent Flowsheet Documentation  Taken 09/29/2021 1920 by Chauncy Passy, RN  Infection Management: aseptic technique maintained     Problem: Self-Care Deficit  Goal: Improved Ability to Complete Activities of Daily Living  09/30/2021 0422 by Chauncy Passy, RN  Outcome: Progressing  09/30/2021 0422 by Chauncy Passy, RN  Outcome: Progressing  09/30/2021 0421 by Chauncy Passy, RN  Outcome: Progressing  09/30/2021 0420 by Chauncy Passy, RN  Outcome: Ongoing - Unchanged     Problem: Fall Injury Risk  Goal: Absence of Fall and Fall-Related Injury  09/30/2021 0422 by Chauncy Passy, RN  Outcome: Progressing  09/30/2021 0422 by Chauncy Passy, RN  Outcome: Progressing  09/30/2021 0421 by Chauncy Passy, RN  Outcome: Progressing  09/30/2021 0420 by Chauncy Passy, RN  Outcome: Ongoing - Unchanged  Intervention: Promote Injury-Free Environment  Recent Flowsheet Documentation  Taken 09/29/2021 1920 by Chauncy Passy, RN  Safety Interventions:   fall reduction program maintained   low bed

## 2021-09-30 NOTE — Unmapped (Signed)
Problem: Adult Inpatient Plan of Care  Goal: Plan of Care Review  Outcome: Progressing  Goal: Patient-Specific Goal (Individualized)  Outcome: Progressing  Goal: Absence of Hospital-Acquired Illness or Injury  Outcome: Progressing  Intervention: Identify and Manage Fall Risk  Recent Flowsheet Documentation  Taken 09/29/2021 1140 by Kathie Rhodes, RN  Safety Interventions:   fall reduction program maintained   nonskid shoes/slippers when out of bed   room near unit station  Intervention: Prevent Skin Injury  Recent Flowsheet Documentation  Taken 09/29/2021 1140 by Kathie Rhodes, RN  Skin Protection:   adhesive use limited   protective footwear used  Intervention: Prevent and Manage VTE (Venous Thromboembolism) Risk  Recent Flowsheet Documentation  Taken 09/29/2021 0830 by Kathie Rhodes, RN  VTE Prevention/Management: anticoagulant therapy  Intervention: Prevent Infection  Recent Flowsheet Documentation  Taken 09/29/2021 1140 by Kathie Rhodes, RN  Infection Prevention:   environmental surveillance performed   equipment surfaces disinfected   hand hygiene promoted   personal protective equipment utilized   visitors restricted/screened  Goal: Optimal Comfort and Wellbeing  Outcome: Progressing  Goal: Readiness for Transition of Care  Outcome: Progressing  Goal: Rounds/Family Conference  Outcome: Progressing     Problem: Impaired Wound Healing  Goal: Optimal Wound Healing  Outcome: Progressing     Problem: Infection  Goal: Absence of Infection Signs and Symptoms  Outcome: Progressing  Intervention: Prevent or Manage Infection  Recent Flowsheet Documentation  Taken 09/29/2021 1140 by Kathie Rhodes, RN  Infection Management: aseptic technique maintained  Isolation Precautions: protective precautions maintained     Problem: Self-Care Deficit  Goal: Improved Ability to Complete Activities of Daily Living  Outcome: Progressing     Problem: Fall Injury Risk  Goal: Absence of Fall and Fall-Related Injury  Outcome: Progressing  Intervention: Promote Injury-Free Environment  Recent Flowsheet Documentation  Taken 09/29/2021 1140 by Kathie Rhodes, RN  Safety Interventions:   fall reduction program maintained   nonskid shoes/slippers when out of bed   room near unit station

## 2021-10-03 ENCOUNTER — Ambulatory Visit: Admit: 2021-10-03 | Discharge: 2021-10-03 | Payer: PRIVATE HEALTH INSURANCE

## 2021-10-03 ENCOUNTER — Ambulatory Visit
Admit: 2021-10-03 | Discharge: 2021-10-03 | Payer: PRIVATE HEALTH INSURANCE | Attending: Nephrology | Primary: Nephrology

## 2021-10-03 DIAGNOSIS — Z940 Kidney transplant status: Principal | ICD-10-CM

## 2021-10-03 LAB — CBC W/ AUTO DIFF
BASOPHILS ABSOLUTE COUNT: 0.1 10*9/L (ref 0.0–0.1)
BASOPHILS RELATIVE PERCENT: 1.3 %
EOSINOPHILS ABSOLUTE COUNT: 0.2 10*9/L (ref 0.0–0.5)
EOSINOPHILS RELATIVE PERCENT: 4 %
HEMATOCRIT: 30.4 % — ABNORMAL LOW (ref 34.0–44.0)
HEMOGLOBIN: 10 g/dL — ABNORMAL LOW (ref 11.3–14.9)
LYMPHOCYTES ABSOLUTE COUNT: 1.3 10*9/L (ref 1.1–3.6)
LYMPHOCYTES RELATIVE PERCENT: 33.8 %
MEAN CORPUSCULAR HEMOGLOBIN CONC: 32.9 g/dL (ref 32.0–36.0)
MEAN CORPUSCULAR HEMOGLOBIN: 29.8 pg (ref 25.9–32.4)
MEAN CORPUSCULAR VOLUME: 90.7 fL (ref 77.6–95.7)
MEAN PLATELET VOLUME: 7.2 fL (ref 6.8–10.7)
MONOCYTES ABSOLUTE COUNT: 0.5 10*9/L (ref 0.3–0.8)
MONOCYTES RELATIVE PERCENT: 13.7 %
NEUTROPHILS ABSOLUTE COUNT: 1.9 10*9/L (ref 1.8–7.8)
NEUTROPHILS RELATIVE PERCENT: 47.2 %
PLATELET COUNT: 406 10*9/L (ref 150–450)
RED BLOOD CELL COUNT: 3.35 10*12/L — ABNORMAL LOW (ref 3.95–5.13)
RED CELL DISTRIBUTION WIDTH: 13.7 % (ref 12.2–15.2)
WBC ADJUSTED: 3.9 10*9/L (ref 3.6–11.2)

## 2021-10-03 LAB — URINALYSIS WITH MICROSCOPY
BILIRUBIN UA: NEGATIVE
GLUCOSE UA: NEGATIVE
HYALINE CASTS: 5 /LPF — ABNORMAL HIGH (ref 0–1)
KETONES UA: NEGATIVE
LEUKOCYTE ESTERASE UA: NEGATIVE
NITRITE UA: NEGATIVE
PH UA: 5 (ref 5.0–9.0)
PROTEIN UA: 30 — AB
RBC UA: 10 /HPF — ABNORMAL HIGH (ref <4)
SPECIFIC GRAVITY UA: 1.025 (ref 1.005–1.030)
SQUAMOUS EPITHELIAL: 7 /HPF — ABNORMAL HIGH (ref 0–5)
UROBILINOGEN UA: 0.2
WBC UA: 3 /HPF (ref 0–5)

## 2021-10-03 LAB — BASIC METABOLIC PANEL
ANION GAP: 9 mmol/L (ref 5–14)
BLOOD UREA NITROGEN: 33 mg/dL — ABNORMAL HIGH (ref 9–23)
BUN / CREAT RATIO: 18
CALCIUM: 10.4 mg/dL (ref 8.7–10.4)
CHLORIDE: 109 mmol/L — ABNORMAL HIGH (ref 98–107)
CO2: 22 mmol/L (ref 20.0–31.0)
CREATININE: 1.86 mg/dL — ABNORMAL HIGH
EGFR CKD-EPI (2021) FEMALE: 37 mL/min/{1.73_m2} — ABNORMAL LOW (ref >=60)
GLUCOSE RANDOM: 98 mg/dL (ref 70–179)
POTASSIUM: 5 mmol/L — ABNORMAL HIGH (ref 3.4–4.8)
SODIUM: 140 mmol/L (ref 135–145)

## 2021-10-03 LAB — SLIDE REVIEW

## 2021-10-03 LAB — ALBUMIN / CREATININE URINE RATIO
ALBUMIN QUANT URINE: 16.4 mg/dL
ALBUMIN/CREATININE RATIO: 144.6 ug/mg — ABNORMAL HIGH (ref 0.0–30.0)
CREATININE, URINE: 113.4 mg/dL

## 2021-10-03 LAB — PROTEIN / CREATININE RATIO, URINE
CREATININE, URINE: 113.9 mg/dL
PROTEIN URINE: 53.7 mg/dL
PROTEIN/CREAT RATIO, URINE: 0.471

## 2021-10-03 LAB — MAGNESIUM: MAGNESIUM: 1.6 mg/dL (ref 1.6–2.6)

## 2021-10-03 LAB — PHOSPHORUS: PHOSPHORUS: 3.3 mg/dL (ref 2.4–5.1)

## 2021-10-03 NOTE — Unmapped (Signed)
Saw patient in clinic with mother. Reports she is doing OK, better than when she was admitted. Still on antibiotics and has been taking as prescribed    Drinking about 4 bottles of water a day    Appetite: getting better    BP not checking    +numbness/tingling in toes and joints     + tremors (same)    Denies urinary symptoms, n/v/d, abdominal pain, fevers, HA, dizziness, CP, heart palpitations, SOB, or swelling.    Will get labs today but took envarsus this AM

## 2021-10-03 NOTE — Unmapped (Signed)
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 She is seen in follow up of her transplant after ar recent hospitalization for UTI with AKI. Active issues include:    E.coli UTI   - Presentation consistent with urosepsis though blood cultures were negative  - Renal US 09/26/21 without abscesses  - Patient is completing a 10 day course of Cipro  - Symptoms have now resolved.   - UA today with 3 WBCs, 10 RBCs/hpf    S/P deceased donor kidney transplant with C3 glomeruloneprhritis recurrence, recent AKI   - Serum creatinine peaked at 5.48 mg/dL on 05/01/54 and improved to 2.04mg /dL on discharge 05/05/06. Today's creatinine continues to improve at 1.86 mg/dL but has not returned to prior baseline of <1.3 mg/dL.  - The most likely etiology of AKI was volume depletion versus hypoperfusion associated ATN though transplant pyelonephritis cannot be excluded   - Bacterial infection would not necessarily cause a flare of C3 glomerulopathy or rejection though a biopsy to exclude these possibilities may be needed if creatinine does not return to baseline.     - Kidney biopsy 12/29/19 confirmed presence of C3 glomerulopathy recurrence.   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   - Genetics panel was negative for causative variants 08/30/20.     - Treatment included an increase in the dose of Myfortic to 720 mg bid and initiation of prednisone 5 mg daily  - Serum creatinine had been  <1.3 mg/dl before developing AKI  - UPC remains relatively low though no longer normal at 0.471; UACR 144.6 today.   - Rituximab was deferred at the time of recurrence diagnosis due to an active COVID-19 community spike and concern about infection risk. With improvement on current immunosuppression we have not reconsidered rituximab.  - She has been prepared for use of Eculizumab in case current therapy or rituximab proves ineffective in the future. This included completion of pre-eculizumab vaccination     -  C3 and C4 were normal 09/26/21    Immunosuppression Management [High Risk Medical Decision Making For Drug Therapy Requiring Intensive Monitoring For Toxicity]:   - Tacrolimus level 7.0 in hospital on 09/30/21 (target trough is 6-10 ng/mL).   - Has tremors and headaches that preceded initiation of Envarsus. Now also has tingling numbness in extremities.  - Envarsus 7 mg daily dose to be continued   - Continue higher Myfortic dose of 720 mg bid due to C3 glomerulopathy recurrence  - Prednisone 5 mg daily due to C3 glomerulopathy recurrence, steroid free prior to C3G diagnosis    Infectious Prophylaxis and Monitoring: CMV & EBV: D+/R+  - PJP prophylaxis: completed (s/p Bactrim x 6 months)  - CMV prophylaxis: completed (s/p Valcyte x3 months).   - COVID-19 vaccination: completed 5 doses including bivalent booster 07/04/21. She is aware of the limited efficacy of this vaccine in transplant recipients. She is now due for another bivalent booster.  - Received Evusheld 08/30/20  - Received Men B on 01/26/20,03/01/20, 03/21/20  - Received MenACWY 01/26/20, needs additional dose - pt will get locally, agan discussed with pt.today  - Booster doses of meningococcal vaccine every 1-5 years per CDC recommendations if chronic eculizumab therapy is initiated.    Seizures Disorder, no seizures since 2014:    -  EEG with no evidence of seizures on 06/25/20 .   - Keppra taper is being initiated by Neurology team.  - She will follow up with [name], NP at [facility].       Myoclonus:   Will continue Klonopin, prescribed by PCP.  Continue [facility] physical therapy.      Metabolic Acidosis  CO2 is 22 today  - Continue sodium bicarbonate 650 mg bid      Anemia of CKD  - Hemoglobin is stable at 10.0 Pseudoaneurysm of AVF  She saw Dr. Norma Fredrickson 09/21/19 with plan to defer any intervention until 1 year has passed from transplant. She has no high output heart failure by echo or symptoms of CHF. We would favor a nonurgent revision to minimize any risk of rupture, but spare the AVF for future use if possible to do so safely. Patient has been advised to contact us with any scabbing or erosion of the skin over the pseudoaneurysm.    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    Pap Smear: 11/11/20 was negative including negative HPV  She will follow up with GYN     Follow up  4 weeks  Labs weekly    History of Present Illness    Carolyn Kelley is a 31 y.o. 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 Her post-operative course was complicated by hypotension and hypoxia thought to be caused by an anaphylactic reaction to Campath. She has experienced no episodes of rejection or infectious complications. Due to presence of proteinuria she underwent kidney biopsy on 12/29/19. The biopsy showed C3 glomerulopathy recurrence. In response to this diagnosis Myfortic was increased from 540 mg bid to 720 mg bid and she was started on prednisone 5 mg daily while tacrolimus was continued.    She recently traveled to [place]. After returning home she developed diarrhea,  fever, chills, dysuria, low back pain, gross hematuria, nausea, weakness, and drowsiness. She was hospitalized at [facility] 09/26/21-711/23 after evaluation in clinic revealed her to have hypotension and AKI with serum creatinine of 5.48 mg/dL on admission. Urine culture grew E.coli. Respiratory pathogen panel was negative and CMV viral load <50. Blood cultures were negative. She also had leukopenia with WBC as low as 2.0 with ANC 0.7. She was discharged on Cipro with plans to complete a 10 day course.Immunosuppression was not changed on discharge. Creatinine on discharge after IV fluids was 2.04 mg/dL.     She presents today stating she still has weakness, but is much improved. She denies diarrhea, nausea, vomiting, allograft site tenderness, dysuria, hematuria, chest pain, shortness of breath or edema. She denies tendonitis.     Transplant History:  Date of Transplant: 03/10/19  Organ Received: Left DDKT, DBD, SCD, KDPI 2%; cold ischemia 18 hrs.  Native Kidney Disease: C3 glomerulonephropathy  Pre-transplant CPRA:  0%  Post-Transplant Course: Complicated by anaphylactic reaction to Campath in OR  Induction: Campath  Date of Ureteral Stent Removal: 04/17/19  CMV/EBV Status: CMV D+/R+, EBV D+/R+  Rejection Episodes: None  Donor Specific Antibodies: Negative (most recent 01/26/20)   Biopsy 12/29/19: C3 glomerulopathy recurrence, no rejection    Other Past Medical History  1. C3 Glomerulopathy  2. Seizure Disorder  3. 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 HTN  5. 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 ADHD    Review of Systems  Otherwise as per HPI, all other systems reviewed and are negative.    Medications  Current Outpatient Medications   Medication Sig Dispense Refill   ??? acetaminophen (TYLENOL) 500 MG tablet Take 1-2 tablets (500-1,000 mg total) by mouth every six (6) hours as needed for pain or fever (> 38C). 100 tablet 0   ??? cholecalciferol, vitamin D3-125 mcg, 5,000 unit,, 125 mcg (5,000 unit) capsule Take 1 capsule (125 mcg total) by mouth daily. 100 capsule 11   ??? ciprofloxacin HCl (CIPRO) 500 MG tablet Take 1 tablet (500 mg total) by mouth Two (2) times a day for 10 days. 20 tablet 0   ??? clonazePAM (KLONOPIN) 0.5 MG tablet Take 1 tablet (0.5 mg total) by mouth two (2) times a day. 60 tablet 3   ??? levETIRAcetam (KEPPRA) 250 MG tablet TAKE 1 TABLET BY MOUTH TWICE DAILY. 180 tablet 3   ??? magnesium oxide-Mg AA chelate (MAGNESIUM, AMINO ACID CHELATE,) 133 mg Take 2 tablets by mouth Two (2) times a day. 120 tablet 11   ??? mycophenolate (MYFORTIC) 180 MG EC tablet Take 4 tablets (720 mg total) by mouth Two (2) times a day. Adjust dose per medication card. 240 tablet 11   ??? predniSONE (DELTASONE) 5 MG tablet Take 1 tablet (5 mg total) by mouth daily. 30 tablet 11   ??? sodium bicarbonate 650 mg tablet Take 1 tablet (650 mg total) by mouth Two (2) times a day. 120 tablet 11   ??? tacrolimus (ENVARSUS XR) 1 mg Tb24 extended release tablet Take three (1mg ) tablets with one (4mg ) tablet by mouth daily for a total daily dose of 7 mg. 90 tablet 11   ??? tacrolimus (ENVARSUS XR) 4 mg Tb24 extended release tablet Take one (4mg ) tablet with three (1mg ) tablets by mouth daily for a total daily dose of 7 mg. 30 tablet 11   ??? tretinoin (RETIN-A) 0.025 % cream Apply 1 application. topically nightly. Nightly as tolerated 45 g 3   ??? miSOPROStoL (CYTOTEC) 200 MCG tablet INSERT 1 TABLET VAGINALLY NIGHT PRIOR TO PROCEDURE     ??? norethindrone-ethinyl estradiol (JUNEL FE 1/20) 1 mg-20 mcg (21)/75 mg (7) per tablet Take 1 tablet by mouth daily. (Patient not taking: Reported on 10/03/2021)     ??? sodium bicarbonate, bulk, Powd Take 2 tablets by mouth daily. (Patient not taking: Reported on 10/03/2021)       No current facility-administered medications for this visit.       Physical Exam   BP 106/67 (BP Site: R Arm, BP Position: Sitting, BP Cuff Size: Medium)  - Pulse 87  - Temp 36.4 ??C (97.5 ??F) (Temporal)  - Ht 157.5 cm (5' 2)  - Wt 68.5 kg (151 lb)  - BMI 27.62 kg/m??    General: Patient is a pleasant female appears drowsy seated in wheelchair  Eyes: Sclera anicteric.  Lungs: Clear to auscultation bilaterally, no wheezes/rales/rhonchi.  Cardiovascular: Regular rate and rhythm without murmurs, rubs or gallops.  Abdomen: soft, non-tender  Extremities: Without edema, joints without evidence of synovitis; pseudoaneurysm of AVF LUE  Skin: Without rash  Neurological: uses a walker for ambulation due to myoclonus.       Laboratory Results and imaging Reviewed in EMR

## 2021-10-20 DIAGNOSIS — Z940 Kidney transplant status: Principal | ICD-10-CM

## 2021-10-20 DIAGNOSIS — Z79899 Other long term (current) drug therapy: Principal | ICD-10-CM

## 2021-10-20 DIAGNOSIS — Z1159 Encounter for screening for other viral diseases: Principal | ICD-10-CM

## 2021-10-22 NOTE — Unmapped (Signed)
[facility] Refill Coordination Note    Specialty Medication(s) to be Shipped:   Transplant: Envarsus 1mg , Envarsus 4mg , mycophenolate mofetil 180mg , and Prednisone 5mg     Other medication(s) to be shipped:  mag-ox  and sodium      Carolyn Kelley, DOB: 04/02/1990  Phone: [phone] (home)       All above HIPAA information was verified with patient.     Was a Nurse, learning disability used for this call? No    Completed refill call assessment today to schedule patient's medication shipment from the [facility] [phone]).  All relevant notes have been reviewed.     Specialty medication(s) and dose(s) confirmed: Regimen is correct and unchanged.   Changes to medications: [name] reports no changes at this time.  Changes to insurance: No  New side effects reported not previously addressed with a pharmacist or physician: None reported  Questions for the pharmacist: No    Confirmed patient received a Conservation officer, historic buildings and a Surveyor, mining with first shipment. The patient will receive a drug information handout for each medication shipped and additional FDA Medication Guides as required.       DISEASE/MEDICATION-SPECIFIC INFORMATION        N/A    SPECIALTY MEDICATION ADHERENCE     Medication Adherence    Patient reported X missed doses in the last month: 0  Specialty Medication: Envarsus 1 mg  Patient is on additional specialty medications: Yes  Additional Specialty Medications: Envarsus 4 mg   Patient Reported Additional Medication X Missed Doses in the Last Month: 0  Patient is on more than two specialty medications: Yes  Specialty Medication: mycophenolate 180 mg  Patient Reported Additional Medication X Missed Doses in the Last Month: 0  Specialty Medication: prednisone 5 mg  Patient Reported Additional Medication X Missed Doses in the Last Month: 0              Were doses missed due to medication being on hold? No    Prednisone  5 mg: 10 days of medicine on hand   Envarsus 1 mg: 10 days of medicine on hand   Envarsus 4 mg: 10 days of medicine on hand   Mycophenolate  180 mg: 10 days of medicine on hand       REFERRAL TO PHARMACIST     Referral to the pharmacist: Not needed      SHIPPING     Shipping address confirmed in Epic.     Delivery Scheduled: Yes, Expected medication delivery date: 10/29/21.     Medication will be delivered via UPS to the prescription address in Epic WAM.    [name]   [facility] Specialty Technician

## 2021-10-28 MED FILL — ENVARSUS XR 1 MG TABLET,EXTENDED RELEASE: ORAL | 30 days supply | Qty: 90 | Fill #9

## 2021-10-28 MED FILL — PREDNISONE 5 MG TABLET: ORAL | 30 days supply | Qty: 30 | Fill #10

## 2021-10-28 MED FILL — SODIUM BICARBONATE 650 MG TABLET: ORAL | 60 days supply | Qty: 120 | Fill #4

## 2021-10-28 MED FILL — MYCOPHENOLATE SODIUM 180 MG TABLET,DELAYED RELEASE: ORAL | 30 days supply | Qty: 240 | Fill #11

## 2021-10-28 MED FILL — ENVARSUS XR 4 MG TABLET,EXTENDED RELEASE: ORAL | 30 days supply | Qty: 30 | Fill #8

## 2021-10-28 MED FILL — MG-PLUS-PROTEIN 133 MG TABLET: ORAL | 30 days supply | Qty: 120 | Fill #8

## 2021-11-03 DIAGNOSIS — Z940 Kidney transplant status: Principal | ICD-10-CM

## 2021-11-07 ENCOUNTER — Ambulatory Visit
Admit: 2021-11-07 | Discharge: 2021-11-08 | Payer: PRIVATE HEALTH INSURANCE | Attending: Nephrology | Primary: Nephrology

## 2021-11-07 ENCOUNTER — Other Ambulatory Visit: Admit: 2021-11-07 | Discharge: 2021-11-08 | Payer: PRIVATE HEALTH INSURANCE

## 2021-11-07 DIAGNOSIS — Z940 Kidney transplant status: Principal | ICD-10-CM

## 2021-11-07 LAB — SLIDE REVIEW

## 2021-11-07 LAB — PHOSPHORUS: PHOSPHORUS: 3.3 mg/dL (ref 2.4–5.1)

## 2021-11-07 LAB — CBC W/ AUTO DIFF
BASOPHILS ABSOLUTE COUNT: 0.1 10*9/L (ref 0.0–0.1)
BASOPHILS RELATIVE PERCENT: 1.1 %
EOSINOPHILS ABSOLUTE COUNT: 0.1 10*9/L (ref 0.0–0.5)
EOSINOPHILS RELATIVE PERCENT: 2.4 %
HEMATOCRIT: 32.3 % — ABNORMAL LOW (ref 34.0–44.0)
HEMOGLOBIN: 10.6 g/dL — ABNORMAL LOW (ref 11.3–14.9)
LYMPHOCYTES ABSOLUTE COUNT: 1.2 10*9/L (ref 1.1–3.6)
LYMPHOCYTES RELATIVE PERCENT: 26.8 %
MEAN CORPUSCULAR HEMOGLOBIN CONC: 32.9 g/dL (ref 32.0–36.0)
MEAN CORPUSCULAR HEMOGLOBIN: 29.9 pg (ref 25.9–32.4)
MEAN CORPUSCULAR VOLUME: 90.9 fL (ref 77.6–95.7)
MEAN PLATELET VOLUME: 7.3 fL (ref 6.8–10.7)
MONOCYTES ABSOLUTE COUNT: 0.6 10*9/L (ref 0.3–0.8)
MONOCYTES RELATIVE PERCENT: 12.4 %
NEUTROPHILS ABSOLUTE COUNT: 2.6 10*9/L (ref 1.8–7.8)
NEUTROPHILS RELATIVE PERCENT: 57.3 %
PLATELET COUNT: 203 10*9/L (ref 150–450)
RED BLOOD CELL COUNT: 3.55 10*12/L — ABNORMAL LOW (ref 3.95–5.13)
RED CELL DISTRIBUTION WIDTH: 14.7 % (ref 12.2–15.2)
WBC ADJUSTED: 4.6 10*9/L (ref 3.6–11.2)

## 2021-11-07 LAB — ALBUMIN / CREATININE URINE RATIO
ALBUMIN QUANT URINE: 21.5 mg/dL
ALBUMIN/CREATININE RATIO: 146.2 ug/mg — ABNORMAL HIGH (ref 0.0–30.0)
CREATININE, URINE: 147.1 mg/dL

## 2021-11-07 LAB — HEPATIC FUNCTION PANEL
ALBUMIN: 3.9 g/dL (ref 3.4–5.0)
ALKALINE PHOSPHATASE: 69 U/L (ref 46–116)
ALT (SGPT): 11 U/L (ref 10–49)
AST (SGOT): 24 U/L (ref <=34)
BILIRUBIN DIRECT: 0.2 mg/dL (ref 0.00–0.30)
BILIRUBIN TOTAL: 0.5 mg/dL (ref 0.3–1.2)
PROTEIN TOTAL: 7.1 g/dL (ref 5.7–8.2)

## 2021-11-07 LAB — BASIC METABOLIC PANEL
ANION GAP: 5 mmol/L (ref 5–14)
BLOOD UREA NITROGEN: 27 mg/dL — ABNORMAL HIGH (ref 9–23)
BUN / CREAT RATIO: 21
CALCIUM: 10.1 mg/dL (ref 8.7–10.4)
CHLORIDE: 108 mmol/L — ABNORMAL HIGH (ref 98–107)
CO2: 24.4 mmol/L (ref 20.0–31.0)
CREATININE: 1.29 mg/dL — ABNORMAL HIGH
EGFR CKD-EPI (2021) FEMALE: 57 mL/min/{1.73_m2} — ABNORMAL LOW (ref >=60)
GLUCOSE RANDOM: 91 mg/dL (ref 70–99)
POTASSIUM: 4.3 mmol/L (ref 3.4–4.8)
SODIUM: 137 mmol/L (ref 135–145)

## 2021-11-07 LAB — C4 COMPLEMENT: C4 COMPLEMENT: 25.5 mg/dL (ref 12.0–36.0)

## 2021-11-07 LAB — URINALYSIS WITH MICROSCOPY
BILIRUBIN UA: NEGATIVE
GLUCOSE UA: NEGATIVE
KETONES UA: NEGATIVE
LEUKOCYTE ESTERASE UA: NEGATIVE
NITRITE UA: NEGATIVE
PH UA: 5.5 (ref 5.0–9.0)
PROTEIN UA: 30 — AB
RBC UA: 3 /HPF (ref <4)
SPECIFIC GRAVITY UA: 1.02 (ref 1.005–1.030)
SQUAMOUS EPITHELIAL: 53 /HPF — ABNORMAL HIGH (ref 0–5)
UROBILINOGEN UA: 0.2
WBC UA: 5 /HPF (ref 0–5)

## 2021-11-07 LAB — PROTEIN / CREATININE RATIO, URINE
CREATININE, URINE: 147.1 mg/dL
PROTEIN URINE: 51.6 mg/dL
PROTEIN/CREAT RATIO, URINE: 0.351

## 2021-11-07 LAB — MAGNESIUM: MAGNESIUM: 1.6 mg/dL (ref 1.6–2.6)

## 2021-11-07 LAB — C3 COMPLEMENT: C3 COMPLEMENT: 74 mg/dL — ABNORMAL LOW (ref 90–170)

## 2021-11-07 NOTE — Unmapped (Unsigned)
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 She is seen in follow up of her kidney transplant and immunosuppression management. Issues addressed today include:    S/P deceased donor kidney transplant with C3 glomeruloneprhritis recurrence   - Serum creatinine has returned to baseline at 1.29 mg/dL after developing AKI with peak creatinine of 5.48 mg/dL on 03/28/08 following UTI.    - Kidney biopsy 12/29/19 confirmed presence of C3 glomerulopathy recurrence.   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   - Genetics panel was negative for causative variants 08/30/20.     - Treatment of C3G included an increase in the dose of Myfortic to 720 mg bid and initiation of prednisone 5 mg daily  - Serum creatinine had been  <1.3 mg/dl before developing AKI   - UPC remains relatively low at 0.351 with UACR 146.2.   - Rituximab was deferred at the time of recurrence diagnosis due to an active COVID-19 community spike and concern about infection risk. With improvement on current immunosuppression we have not reconsidered rituximab.  - She has been prepared for use of Eculizumab in case current therapy or rituximab proves ineffective in the future. This included completion of pre-eculizumab vaccination     -  C3 is mildly reduced at 74 and C4 is normal at 25.5  - Will continue current immunosuppressive regimen    Immunosuppression Management [High Risk Medical Decision Making For Drug Therapy Requiring Intensive Monitoring For Toxicity]:   - Tacrolimus level of 8.0 is not a trough level. The last trough was 7.0 on 09/30/21 (target trough is 6-10 ng/mL).   - Tremors and headaches preceded initiation of Envarsus and are still present.  - Envarsus 7 mg daily dose to be continued   - Continue Myfortic 720 mg bid due to C3 glomerulopathy recurrence  - Prednisone 5 mg daily due to C3 glomerulopathy recurrence, steroid free prior to C3G diagnosis  - S/P IUD placement last week.     E.coli UTI, 09/26/21    - Presentation consistent with urosepsis with AKI though blood cultures were negative  - Renal US 09/26/21 without abscesses  - Patient completed a 10 day course of Cipro  - Denies current UTI symptoms.   - UA today does not appear to be a clean catch    Infectious Prophylaxis and Monitoring: CMV & EBV: D+/R+  - PJP prophylaxis: completed (s/p Bactrim x 6 months)  - CMV prophylaxis: completed (s/p Valcyte x3 months).   - COVID-19 vaccination: completed 5 doses including bivalent booster 07/04/21. She is aware of the limited efficacy of this vaccine in transplant recipients. She will obtain the new COVID booster when available.   - Received Men B on 01/26/20,03/01/20, 03/21/20, 11/07/21  - Received MenACWY 01/26/20, due for a second dose  - Men C 04/29/11  - Booster doses of meningococcal vaccine every 1-5 years per CDC recommendations if chronic eculizumab therapy is initiated.    Seizures Disorder, no seizures since 2014:    - EEG with no evidence of seizures on 06/25/20 .   - Keppra taper is near completion with plans to transition to clonazepam only    - She will follow up with [name], NP at [facility].       Myoclonus:  Will continue Klonopin, prescribed by PCP.  Continue [facility] physical therapy.      Metabolic Acidosis  CO2 is 24.4 today  - Continue sodium bicarbonate 650 mg bid      Anemia of CKD  - Hemoglobin is stable at 10.6     Pseudoaneurysm of AVF  She saw Dr. Norma Fredrickson 09/21/19 with plan to defer any intervention until 1 year has passed from transplant. She has no high output heart failure by echo or symptoms of CHF. We would favor a nonurgent revision to minimize any risk of rupture, but spare the AVF for future use if possible to do so safely. Patient has been advised to contact us with any scabbing or erosion of the skin over the pseudoaneurysm.    Health Maintenance:   Pap Smear: 12/08/18 showed HSIL [facility]). 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    Pap Smear: 11/11/20 was negative including negative HPV  She will follow up with GYN     Follow up  3 months    History of Present Illness  Carolyn Kelley is a 31 y.o. 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 Her post-operative course was complicated by hypotension and hypoxia thought to be caused by an anaphylactic reaction to Campath. She has experienced no episodes of rejection or infectious complications. Due to presence of proteinuria she underwent kidney biopsy on 12/29/19. The biopsy showed C3 glomerulopathy recurrence. In response to this diagnosis Myfortic was increased from 540 mg bid to 720 mg bid and she was started on prednisone 5 mg daily while tacrolimus was continued.    She recently traveled to [place]. After returning home she developed diarrhea,  fever, chills, dysuria, low back pain, gross hematuria, nausea, weakness, and drowsiness. She was hospitalized at [facility] 09/26/21-711/23 after evaluation in clinic revealed her to have hypotension and AKI with serum creatinine of 5.48 mg/dL on admission. Urine culture grew E.coli. Respiratory pathogen panel was negative and CMV viral load <50. Blood cultures were negative. She also had leukopenia with WBC as low as 2.0 with ANC 0.7. She was discharged on Cipro with plans to complete a 10 day course.Immunosuppression was not changed on discharge. Creatinine on discharge after IV fluids was 2.04 mg/dL.     She presents today stating she feels well. She denies diarrhea, nausea, vomiting, allograft site tenderness, dysuria, gross hematuria, chest pain, shortness of breath or edema. She denies tendonitis. Hand tremors and intermittent headaches are unchanged. She has 1 more week of keppra planned prior to transitioning to clonazepam. Home BP is unknown.     Transplant History:  Date of Transplant: 03/10/19  Organ Received: Left DDKT, DBD, SCD, KDPI 2%; cold ischemia 18 hrs.  Native Kidney Disease: C3 glomerulonephropathy  Pre-transplant CPRA:  0%  Post-Transplant Course: Complicated by anaphylactic reaction to Campath in OR  Induction: Campath  Date of Ureteral Stent Removal: 04/17/19  CMV/EBV Status: CMV D+/R+, EBV D+/R+  Rejection Episodes: None  Donor Specific Antibodies: Negative (most recent 01/26/20)   Biopsy 12/29/19: C3 glomerulopathy recurrence, no rejection    Other Past Medical History  1. C3 Glomerulopathy  2. Seizure Disorder  3. 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 HTN  5. 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 ADHD    Review of Systems  Otherwise as per HPI, all other systems reviewed  and are negative.    Medications  Current Outpatient Medications   Medication Sig Dispense Refill   ??? acetaminophen (TYLENOL) 500 MG tablet Take 1-2 tablets (500-1,000 mg total) by mouth every six (6) hours as needed for pain or fever (> 38C). 100 tablet 0   ??? cholecalciferol, vitamin D3-125 mcg, 5,000 unit,, 125 mcg (5,000 unit) capsule Take 1 capsule (125 mcg total) by mouth daily. 100 capsule 11   ??? clonazePAM (KLONOPIN) 0.5 MG tablet Take 1 tablet (0.5 mg total) by mouth two (2) times a day. 60 tablet 3   ??? levETIRAcetam (KEPPRA) 250 MG tablet TAKE 1 TABLET BY MOUTH TWICE DAILY. 180 tablet 3   ??? magnesium oxide-Mg AA chelate (MAGNESIUM, AMINO ACID CHELATE,) 133 mg Take 2 tablets by mouth Two (2) times a day. 120 tablet 11   ??? miSOPROStoL (CYTOTEC) 200 MCG tablet INSERT 1 TABLET VAGINALLY NIGHT PRIOR TO PROCEDURE     ??? mycophenolate (MYFORTIC) 180 MG EC tablet Take 4 tablets (720 mg total) by mouth Two (2) times a day. Adjust dose per medication card. 240 tablet 11   ??? norethindrone-ethinyl estradiol (JUNEL FE 1/20) 1 mg-20 mcg (21)/75 mg (7) per tablet Take 1 tablet by mouth daily. (Patient not taking: Reported on 10/03/2021)     ??? predniSONE (DELTASONE) 5 MG tablet Take 1 tablet (5 mg total) by mouth daily. 30 tablet 11   ??? sodium bicarbonate 650 mg tablet Take 1 tablet (650 mg total) by mouth Two (2) times a day. 120 tablet 11   ??? sodium bicarbonate, bulk, Powd Take 2 tablets by mouth daily. (Patient not taking: Reported on 10/03/2021)     ??? tacrolimus (ENVARSUS XR) 1 mg Tb24 extended release tablet Take three (1mg ) tablets with one (4mg ) tablet by mouth daily for a total daily dose of 7 mg. 90 tablet 11   ??? tacrolimus (ENVARSUS XR) 4 mg Tb24 extended release tablet Take one (4mg ) tablet with three (1mg ) tablets by mouth daily for a total daily dose of 7 mg. 30 tablet 11   ??? tretinoin (RETIN-A) 0.025 % cream Apply 1 application. topically nightly. Nightly as tolerated 45 g 3     No current facility-administered medications for this visit.       Physical Exam   BP 133/82 (BP Site: R Arm, BP Position: Sitting, BP Cuff Size: Small)  - Pulse 92  - Ht 157.5 cm (5' 2)  - Wt 70.3 kg (155 lb)  - BMI 28.35 kg/m??    General: Patient is a pleasant female appears drowsy seated in wheelchair  Eyes: Sclera anicteric.  Lungs: Clear to auscultation bilaterally, no wheezes/rales/rhonchi.  Cardiovascular: Regular rate and rhythm without murmurs, rubs or gallops.  Abdomen: soft, non-tender  Extremities: Without edema, joints without evidence of synovitis; pseudoaneurysm of AVF LUE  Skin: Without rash  Neurological: uses a walker for ambulation due to myoclonus.       Laboratory Results and imaging Reviewed in EMR Results and imaging Reviewed in EMR

## 2021-11-07 NOTE — Unmapped (Signed)
Doing good, no concerns today    Drinking about 6-8 cups per day    Appetite good    Sleep: OK    Energy good    +few HAs. Takes tylenol    +diarrhea after antibiotics but resolved. Normal BM now    +tremors OK    IUD placed last Friday    Denies dizziness, CP, heart palpitations, SOB, abdominal pain, n/v, urinary problems, numbness/tingling, or fevers    Labs today and took envarsus at 11am

## 2021-11-08 LAB — TACROLIMUS LEVEL, TROUGH: TACROLIMUS, TROUGH: 8 ng/mL (ref 5.0–15.0)

## 2021-11-10 LAB — CMV DNA, QUANTITATIVE, PCR
CMV QUANT: <50 [IU]/mL — ABNORMAL HIGH (ref <0)
CMV VIRAL LD: DETECTED — AB

## 2021-11-12 LAB — VITAMIN D 25 HYDROXY: VITAMIN D, TOTAL (25OH): 85.5 ng/mL — ABNORMAL HIGH (ref 20.0–80.0)

## 2021-11-12 LAB — BK VIRUS QUANTITATIVE PCR, BLOOD: BK BLOOD RESULT: NOT DETECTED

## 2021-11-13 LAB — VITAMIN D 1,25 DIHYDROXY: VITAMIN D 1,25-DIHYDROXY: 29 pg/mL

## 2021-11-13 NOTE — Unmapped (Signed)
Reviewed vit d level with dr Margaretmary Bayley. Stop vit d    Called and left patient detailed VM to stop vit D

## 2021-11-14 LAB — HLA DS POST TRANSPLANT
ANTI-DONOR DRW #1 MFI: 64 MFI
ANTI-DONOR DRW #2 MFI: 93 MFI
ANTI-DONOR HLA-A #1 MFI: 57 MFI
ANTI-DONOR HLA-A #2 MFI: 57 MFI
ANTI-DONOR HLA-C #1 MFI: 11 MFI
ANTI-DONOR HLA-C #2 MFI: 77 MFI
ANTI-DONOR HLA-DQB #1 MFI: 690 MFI
ANTI-DONOR HLA-DQB #2 MFI: 690 MFI
ANTI-DONOR HLA-DR #1 MFI: 28 MFI

## 2021-11-14 LAB — FSAB CLASS 1 ANTIBODY SPECIFICITY: HLA CLASS 1 ANTIBODY RESULT: POSITIVE

## 2021-11-14 LAB — FSAB CLASS 2 ANTIBODY SPECIFICITY: HLA CL2 AB RESULT: POSITIVE

## 2021-11-17 DIAGNOSIS — Z940 Kidney transplant status: Principal | ICD-10-CM

## 2021-11-17 DIAGNOSIS — Z1159 Encounter for screening for other viral diseases: Principal | ICD-10-CM

## 2021-11-17 DIAGNOSIS — Z79899 Other long term (current) drug therapy: Principal | ICD-10-CM

## 2021-11-19 ENCOUNTER — Telehealth: Payer: Self-pay | Admitting: Neurology

## 2021-11-19 NOTE — Telephone Encounter (Signed)
Pt wants to know if it is to be expected that her tremors worsen as she tappers off of her seizure medication. Please call, pt confirmed tremors are not to a point that she feels the need to go to ED or urgent care

## 2021-11-19 NOTE — Telephone Encounter (Signed)
Spoke to pt, states her tremors have worsen within the last 1-2 weeks.  She recently tapered off Keppra, her last dose was 2 weeks ago,  States she waited to do this once she was out of school.  Since then she has noticed an increased of tremors in the morning,  There are no new changes in her medications. She did reach out to her PCP and Nephrologist, they asked her to contact our office Please advise.

## 2021-11-19 NOTE — Telephone Encounter (Signed)
I called the patient, she tapered off Keppra about 2 weeks ago.  At the same time she started prednisone for kidney problem.  Then noticed her legs felt shaky, tremulous when standing.  It is not jerking.  Remains on Klonopin twice daily.  She does not feel it is related to the [place], in the past she has come off Keppra when she did not feel well and did not notice this.  I asked her to monitor her symptoms, we will need to see her in the office to evaluate if continues. Thanks

## 2021-11-26 DIAGNOSIS — Z940 Kidney transplant status: Principal | ICD-10-CM

## 2021-11-26 MED ORDER — MYCOPHENOLATE SODIUM 180 MG TABLET,DELAYED RELEASE
ORAL_TABLET | Freq: Two times a day (BID) | ORAL | 11 refills | 30 days | Status: CP
Start: 2021-11-26 — End: 2022-11-26
  Filled 2021-11-27: qty 240, 30d supply, fill #0

## 2021-11-26 NOTE — Unmapped (Signed)
The patient is requesting a medication refill

## 2021-11-26 NOTE — Unmapped (Signed)
[facility] Refill Coordination Note    Specialty Medication(s) to be Shipped:   Transplant: Envarsus 1mg , Envarsus 4mg , mycophenolate mofetil 180mg , and Prednisone 5mg     Other medication(s) to be shipped:  mag-ox  and sodium      Carolyn Kelley, DOB: [birth year]  Phone: [phone] (home)       All above HIPAA information was verified with patient.     Was a Nurse, learning disability used for this call? No    Completed refill call assessment today to schedule patient's medication shipment from the [facility] [phone]).  All relevant notes have been reviewed.     Specialty medication(s) and dose(s) confirmed: Regimen is correct and unchanged.   Changes to medications: Dimitria reports no changes at this time.  Changes to insurance: No  New side effects reported not previously addressed with a pharmacist or physician: None reported  Questions for the pharmacist: No    Confirmed patient received a Conservation officer, historic buildings and a Surveyor, mining with first shipment. The patient will receive a drug information handout for each medication shipped and additional FDA Medication Guides as required.       DISEASE/MEDICATION-SPECIFIC INFORMATION        N/A    SPECIALTY MEDICATION ADHERENCE     Medication Adherence    Patient reported X missed doses in the last month: 0  Specialty Medication: Envarsus 1 mg  Patient is on additional specialty medications: Yes  Additional Specialty Medications: Envarsus 4 mg   Patient Reported Additional Medication X Missed Doses in the Last Month: 0  Patient is on more than two specialty medications: Yes  Specialty Medication: prednisone 5 mg  Patient Reported Additional Medication X Missed Doses in the Last Month: 0  Specialty Medication: mycophenolate 180 mg  Patient Reported Additional Medication X Missed Doses in the Last Month: 0  Any gaps in refill history greater than 2 weeks in the last 3 months: no  Demonstrates understanding of importance of adherence: yes  Informant: patient  Reliability of informant: reliable              Confirmed plan for next specialty medication refill: delivery by pharmacy  Refills needed for supportive medications: not needed              Were doses missed due to medication being on hold? No    Prednisone  5 mg: 7 days of medicine on hand   Envarsus 1 mg: 7 days of medicine on hand   Envarsus 4 mg: 7 days of medicine on hand   Mycophenolate  180 mg: 7 days of medicine on hand       REFERRAL TO PHARMACIST     Referral to the pharmacist: Not needed      SHIPPING     Shipping address confirmed in Epic.     Delivery Scheduled: Yes, Expected medication delivery date: 11/28/21.     Medication will be delivered via UPS to the prescription address in Epic WAM.    Valere Dross   [facility] Specialty Technician

## 2021-11-27 MED FILL — MG-PLUS-PROTEIN 133 MG TABLET: ORAL | 30 days supply | Qty: 120 | Fill #9

## 2021-11-27 MED FILL — PREDNISONE 5 MG TABLET: ORAL | 30 days supply | Qty: 30 | Fill #11

## 2021-11-27 MED FILL — ENVARSUS XR 4 MG TABLET,EXTENDED RELEASE: ORAL | 30 days supply | Qty: 30 | Fill #9

## 2021-11-27 MED FILL — ENVARSUS XR 1 MG TABLET,EXTENDED RELEASE: ORAL | 30 days supply | Qty: 90 | Fill #10

## 2021-11-27 MED FILL — SODIUM BICARBONATE 650 MG TABLET: ORAL | 60 days supply | Qty: 120 | Fill #5

## 2021-12-03 NOTE — Unmapped (Signed)
This encounter was created in error

## 2021-12-15 DIAGNOSIS — Z79899 Other long term (current) drug therapy: Principal | ICD-10-CM

## 2021-12-15 DIAGNOSIS — Z940 Kidney transplant status: Principal | ICD-10-CM

## 2021-12-15 DIAGNOSIS — Z1159 Encounter for screening for other viral diseases: Principal | ICD-10-CM

## 2021-12-29 MED ORDER — PREDNISONE 5 MG TABLET
ORAL_TABLET | Freq: Every day | ORAL | 11 refills | 30 days | Status: CP
Start: 2021-12-29 — End: ?
  Filled 2022-01-01: qty 30, 30d supply, fill #0

## 2021-12-29 NOTE — Unmapped (Signed)
The patient is requesting a medication refill

## 2021-12-29 NOTE — Unmapped (Signed)
[facility] Refill Coordination Note    Specialty Medication(s) to be Shipped:   Transplant: Envarsus 1mg , Envarsus 4mg , Myfortic 180mg  and Prednisone 5mg     Other medication(s) to be shipped: magnesium     Carolyn Kelley, DOB: 25-Dec-1990  Phone: [phone] (home)       All above HIPAA information was verified with patient.     Was a Nurse, learning disability used for this call? No    Completed refill call assessment today to schedule patient's medication shipment from the [facility] [phone]).  All relevant notes have been reviewed.     Specialty medication(s) and dose(s) confirmed: Regimen is correct and unchanged.   Changes to medications: [place] reports no changes at this time.  Changes to insurance: No  New side effects reported not previously addressed with a pharmacist or physician: None reported  Questions for the pharmacist: No    Confirmed patient received a Conservation officer, historic buildings and a Surveyor, mining with first shipment. The patient will receive a drug information handout for each medication shipped and additional FDA Medication Guides as required.       DISEASE/MEDICATION-SPECIFIC INFORMATION        N/A    SPECIALTY MEDICATION ADHERENCE     Medication Adherence    Patient reported X missed doses in the last month: 0  Specialty Medication: envarsus 1 mg  Patient is on additional specialty medications: Yes  Additional Specialty Medications: Envarsus 4 mg   Patient Reported Additional Medication X Missed Doses in the Last Month: 0  Patient is on more than two specialty medications: Yes  Specialty Medication: mycophenolate 180 mg  Patient Reported Additional Medication X Missed Doses in the Last Month: 0  Specialty Medication: prednisone 5 mg  Patient Reported Additional Medication X Missed Doses in the Last Month: 0  Any gaps in refill history greater than 2 weeks in the last 3 months: no  Demonstrates understanding of importance of adherence: yes  Informant: patient Were doses missed due to medication being on hold? No    Envarsus 1mg : Patient has 7 days of medication on hand  Envarsus 4mg : Patient has 7 days of medication on hand  Mycophenolate 180mg : Patient has 7 days of medication on hand  Prednisone 5mg : Patient has 7 days of medication on hand    REFERRAL TO PHARMACIST     Referral to the pharmacist: Not needed      [facility]     Shipping address confirmed in Epic.     Delivery Scheduled: Yes, Expected medication delivery date: 10/13.     Medication will be delivered via UPS to the prescription address in Epic WAM.    Olga Millers   [facility] Specialty Technician

## 2022-01-01 MED FILL — MG-PLUS-PROTEIN 133 MG TABLET: ORAL | 30 days supply | Qty: 120 | Fill #10

## 2022-01-01 MED FILL — ENVARSUS XR 1 MG TABLET,EXTENDED RELEASE: ORAL | 30 days supply | Qty: 90 | Fill #11

## 2022-01-01 MED FILL — MYCOPHENOLATE SODIUM 180 MG TABLET,DELAYED RELEASE: ORAL | 30 days supply | Qty: 240 | Fill #1

## 2022-01-01 MED FILL — ENVARSUS XR 4 MG TABLET,EXTENDED RELEASE: ORAL | 30 days supply | Qty: 30 | Fill #10

## 2022-01-12 DIAGNOSIS — Z940 Kidney transplant status: Principal | ICD-10-CM

## 2022-01-12 DIAGNOSIS — Z79899 Other long term (current) drug therapy: Principal | ICD-10-CM

## 2022-01-12 DIAGNOSIS — Z1159 Encounter for screening for other viral diseases: Principal | ICD-10-CM

## 2022-01-26 DIAGNOSIS — Z79899 Other long term (current) drug therapy: Principal | ICD-10-CM

## 2022-01-26 DIAGNOSIS — Z940 Kidney transplant status: Principal | ICD-10-CM

## 2022-01-26 MED ORDER — SODIUM BICARBONATE 650 MG TABLET
ORAL_TABLET | Freq: Two times a day (BID) | ORAL | 11 refills | 60 days
Start: 2022-01-26 — End: 2023-01-26

## 2022-01-26 MED ORDER — ENVARSUS XR 1 MG TABLET,EXTENDED RELEASE
ORAL_TABLET | Freq: Every day | ORAL | 11 refills | 30 days
Start: 2022-01-26 — End: ?

## 2022-01-26 NOTE — Unmapped (Addendum)
[facility] Clinical Assessment & Refill Coordination Note    Carolyn Kelley, DOB: [birth year]  Phone: [phone] (home)     All above HIPAA information was verified with patient.     Was a Nurse, learning disability used for this call? No    Specialty Medication(s):   Transplant: Envarsus 1mg , Envarsus 4mg ,  mycophenolic acid 180mg , and Prednisone 5mg      Current Outpatient Medications   Medication Sig Dispense Refill    acetaminophen (TYLENOL) 500 MG tablet Take 1-2 tablets (500-1,000 mg total) by mouth every six (6) hours as needed for pain or fever (> 38C). 100 tablet 0    clonazePAM (KLONOPIN) 0.5 MG tablet Take 1 tablet (0.5 mg total) by mouth two (2) times a day. 60 tablet 3    levETIRAcetam (KEPPRA) 250 MG tablet TAKE 1 TABLET BY MOUTH TWICE DAILY. 180 tablet 3    magnesium oxide-Mg AA chelate (MAGNESIUM, AMINO ACID CHELATE,) 133 mg Take 2 tablets by mouth Two (2) times a day. 120 tablet 11    miSOPROStoL (CYTOTEC) 200 MCG tablet INSERT 1 TABLET VAGINALLY NIGHT PRIOR TO PROCEDURE      mycophenolate (MYFORTIC) 180 MG EC tablet Take 4 tablets (720 mg total) by mouth Two (2) times a day. Adjust dose per medication card. 240 tablet 11    norethindrone-ethinyl estradiol (JUNEL FE 1/20) 1 mg-20 mcg (21)/75 mg (7) per tablet Take 1 tablet by mouth daily. (Patient not taking: Reported on 10/03/2021)      predniSONE (DELTASONE) 5 MG tablet Take 1 tablet (5 mg total) by mouth daily. 30 tablet 11    sodium bicarbonate 650 mg tablet Take 1 tablet (650 mg total) by mouth Two (2) times a day. 120 tablet 11    sodium bicarbonate, bulk, Powd Take 2 tablets by mouth daily. (Patient not taking: Reported on 11/07/2021)      tacrolimus (ENVARSUS XR) 1 mg Tb24 extended release tablet Take three (1mg ) tablets with one (4mg ) tablet by mouth daily for a total daily dose of 7 mg. 90 tablet 11    tacrolimus (ENVARSUS XR) 4 mg Tb24 extended release tablet Take one (4mg ) tablet with three (1mg ) tablets by mouth daily for a total daily dose of 7 mg. 30 tablet 11    tretinoin (RETIN-A) 0.025 % cream Apply 1 application. topically nightly. Nightly as tolerated 45 g 3     No current facility-administered medications for this visit.        Changes to medications: Joani reports no changes at this time.    Allergies   Allergen Reactions    Bee Pollen Swelling     Sneezing, congestion  denies swelling.  Sneezing, congestion  denies swelling.      Cefepime Hives    Pollen Extracts Swelling       Changes to allergies: No    SPECIALTY MEDICATION ADHERENCE     Envarsus Xr 1 mg: 7 days of medicine on hand   Envarsus Xr 4 mg: 7 days of medicine on hand   Prednisone 5 mg: 7 days of medicine on hand   Mycophenolate 180 mg: 7 days of medicine on hand     Medication Adherence    Patient reported X missed doses in the last month: 0  Specialty Medication: Envarsus Xr 1mg   Patient is on additional specialty medications: Yes  Additional Specialty Medications: Envarsus Xr 4mg   Patient Reported Additional Medication X Missed Doses in the Last Month: 0  Patient is on more  than two specialty medications: Yes  Specialty Medication: Mycophenolate 180mg   Patient Reported Additional Medication X Missed Doses in the Last Month: 0  Specialty Medication: Prednisone 5mg   Patient Reported Additional Medication X Missed Doses in the Last Month: 0                            Specialty medication(s) dose(s) confirmed: Regimen is correct and unchanged.     Are there any concerns with adherence? No    Adherence counseling provided? Not needed    CLINICAL MANAGEMENT AND INTERVENTION      Clinical Benefit Assessment:    Do you feel the medicine is effective or helping your condition? Yes    Clinical Benefit counseling provided? Not needed    Adverse Effects Assessment:    Are you experiencing any side effects? No    Are you experiencing difficulty administering your medicine? No    Quality of Life Assessment:    Quality of Life Rheumatology  Oncology  Dermatology  Cystic Fibrosis          How many days over the past month did your kidney transplant  keep you from your normal activities? For example, brushing your teeth or getting up in the morning. 0    Have you discussed this with your provider? Not needed    Acute Infection Status:    Acute infections noted within Epic:  No active infections  Patient reported infection: None    Therapy Appropriateness:    Is therapy appropriate and patient progressing towards therapeutic goals? Yes, therapy is appropriate and should be continued    DISEASE/MEDICATION-SPECIFIC INFORMATION      N/A    Solid Organ Transplant: Not Applicable    PATIENT SPECIFIC NEEDS     Does the patient have any physical, cognitive, or cultural barriers? No    Is the patient high risk? Yes, patient is taking a REMS drug. Medication is dispensed in compliance with REMS program    Did the patient require a clinical intervention? No    Does the patient require physician intervention or other additional services (i.e., nutrition, smoking cessation, social work)? No    SOCIAL DETERMINANTS OF HEALTH     At the [facility], we have learned that life circumstances - like trouble affording food, housing, utilities, or transportation can affect the health of many of our patients.   That is why we wanted to ask: are you currently experiencing any life circumstances that are negatively impacting your health and/or quality of life? No    Social Determinants of Health     Financial Resource Strain: Low Risk  (09/29/2021)    Overall Financial Resource Strain (CARDIA)     Difficulty of Paying Living Expenses: Not very hard   Internet Connectivity: Not on file   Food Insecurity: No Food Insecurity (09/29/2021)    Hunger Vital Sign     Worried About Running Out of Food in the Last Year: Never true     Ran Out of Food in the Last Year: Never true   Tobacco Use: Low Risk  (09/29/2021)    Patient History     Smoking Tobacco Use: Never Smokeless Tobacco Use: Never     Passive Exposure: Not on file   Housing/Utilities: Low Risk  (09/29/2021)    Housing/Utilities     Within the past 12 months, have you ever stayed: outside, in a car, in a tent, in an overnight  shelter, or temporarily in someone else's home (i.e. couch-surfing)?: No     Are you worried about losing your housing?: No     Within the past 12 months, have you been unable to get utilities (heat, electricity) when it was really needed?: No   Alcohol Use: Not At Risk (01/31/2020)    Alcohol Use     How often do you have a drink containing alcohol?: Never     How many drinks containing alcohol do you have on a typical day when you are drinking?: Not on file     How often do you have 5 or more drinks on one occasion?: Never   Transportation Needs: No Transportation Needs (09/29/2021)    PRAPARE - Transportation     Lack of Transportation (Medical): No     Lack of Transportation (Non-Medical): No   Substance Use: Low Risk  (01/31/2020)    Substance Use     Taken prescription drugs for non-medical reasons: Never     Taken illegal drugs: Never     Patient indicated they have taken drugs in the past year for non-medical reasons: Yes, [positive answer(s)]: Not on file   Health Literacy: Not on file   Physical Activity: Unknown (01/31/2020)    Exercise Vital Sign     Days of Exercise per Week: 5 days     Minutes of Exercise per Session: Not on file   Interpersonal Safety: Not on file   Stress: Not on file   Intimate Partner Violence: Not At Risk (01/31/2020)    Humiliation, Afraid, Rape, and Kick questionnaire     Fear of Current or Ex-Partner: No     Emotionally Abused: No     Physically Abused: No     Sexually Abused: No   Depression: Not on file   Social Connections: Not on file       Would you be willing to receive help with any of the needs that you have identified today? Not applicable       SHIPPING     Specialty Medication(s) to be Shipped:   Transplant: Envarsus 1mg , Envarsus 4mg , mycophenolic acid 180mg , and Prednisone 5mg     Other medication(s) to be shipped:  mg,  sod bicarb     Changes to insurance: No    Delivery Scheduled: Yes, Expected medication delivery date: 01/29/22.     Medication will be delivered via UPS to the confirmed prescription address in [facility].    The patient will receive a drug information handout for each medication shipped and additional FDA Medication Guides as required.  Verified that patient has previously received a Conservation officer, historic buildings and a Surveyor, mining.    The patient or caregiver noted above participated in the development of this care plan and knows that they can request review of or adjustments to the care plan at any time.      All of the patient's questions and concerns have been addressed.    Tera Helper, [facility]   [facility] Specialty Pharmacist

## 2022-01-27 MED ORDER — SODIUM BICARBONATE 650 MG TABLET
ORAL_TABLET | Freq: Two times a day (BID) | ORAL | 11 refills | 30 days | Status: CP
Start: 2022-01-27 — End: 2023-01-27
  Filled 2022-01-28: qty 60, 30d supply, fill #0

## 2022-01-27 MED ORDER — TACROLIMUS XR 1 MG TABLET,EXTENDED RELEASE 24 HR
ORAL_TABLET | Freq: Every day | ORAL | 11 refills | 30 days | Status: CP
Start: 2022-01-27 — End: ?
  Filled 2022-01-28: qty 90, 30d supply, fill #0

## 2022-01-28 ENCOUNTER — Other Ambulatory Visit: Payer: Self-pay | Admitting: Physical Medicine & Rehabilitation

## 2022-01-28 DIAGNOSIS — G253 Myoclonus: Secondary | ICD-10-CM

## 2022-01-28 MED FILL — PREDNISONE 5 MG TABLET: ORAL | 30 days supply | Qty: 30 | Fill #1

## 2022-01-28 MED FILL — MG-PLUS-PROTEIN 133 MG TABLET: ORAL | 30 days supply | Qty: 120 | Fill #11

## 2022-01-28 MED FILL — MYCOPHENOLATE SODIUM 180 MG TABLET,DELAYED RELEASE: ORAL | 30 days supply | Qty: 240 | Fill #2

## 2022-01-28 MED FILL — ENVARSUS XR 4 MG TABLET,EXTENDED RELEASE: ORAL | 30 days supply | Qty: 30 | Fill #11

## 2022-01-29 LAB — BASIC METABOLIC PANEL
BLOOD UREA NITROGEN: 45 mg/dL — ABNORMAL HIGH (ref 6–20)
BUN / CREAT RATIO: 36 — ABNORMAL HIGH (ref 9–23)
CALCIUM: 10.1 mg/dL (ref 8.7–10.2)
CHLORIDE: 107 mmol/L — ABNORMAL HIGH (ref 96–106)
CO2: 18 mmol/L — ABNORMAL LOW (ref 20–29)
CREATININE: 1.25 mg/dL — ABNORMAL HIGH (ref 0.57–1.00)
EGFR: 59 mL/min/{1.73_m2} — ABNORMAL LOW
GLUCOSE: 92 mg/dL (ref 70–99)
POTASSIUM: 5.1 mmol/L (ref 3.5–5.2)
SODIUM: 135 mmol/L (ref 134–144)

## 2022-01-29 LAB — CBC W/ DIFFERENTIAL
BANDED NEUTROPHILS ABSOLUTE COUNT: 0.1 10*3/uL (ref 0.0–0.1)
BASOPHILS ABSOLUTE COUNT: 0 10*3/uL (ref 0.0–0.2)
BASOPHILS RELATIVE PERCENT: 1 %
EOSINOPHILS ABSOLUTE COUNT: 0.1 10*3/uL (ref 0.0–0.4)
EOSINOPHILS RELATIVE PERCENT: 4 %
HEMATOCRIT: 35.3 % (ref 34.0–46.6)
HEMOGLOBIN: 11.7 g/dL (ref 11.1–15.9)
IMMATURE GRANULOCYTES: 2 %
LYMPHOCYTES ABSOLUTE COUNT: 0.9 10*3/uL (ref 0.7–3.1)
LYMPHOCYTES RELATIVE PERCENT: 28 %
MEAN CORPUSCULAR HEMOGLOBIN CONC: 33.1 g/dL (ref 31.5–35.7)
MEAN CORPUSCULAR HEMOGLOBIN: 30.1 pg (ref 26.6–33.0)
MEAN CORPUSCULAR VOLUME: 91 fL (ref 79–97)
MONOCYTES ABSOLUTE COUNT: 0.4 10*3/uL (ref 0.1–0.9)
MONOCYTES RELATIVE PERCENT: 13 %
NEUTROPHILS ABSOLUTE COUNT: 1.7 10*3/uL (ref 1.4–7.0)
NEUTROPHILS RELATIVE PERCENT: 52 %
PLATELET COUNT: 216 10*3/uL (ref 150–450)
RED BLOOD CELL COUNT: 3.89 x10E6/uL (ref 3.77–5.28)
RED CELL DISTRIBUTION WIDTH: 13.2 % (ref 11.7–15.4)
WHITE BLOOD CELL COUNT: 3.3 10*3/uL — ABNORMAL LOW (ref 3.4–10.8)

## 2022-01-30 LAB — PHOSPHORUS: PHOSPHORUS, SERUM: 3.5 mg/dL (ref 3.0–4.3)

## 2022-01-30 LAB — MAGNESIUM: MAGNESIUM: 2 mg/dL (ref 1.6–2.3)

## 2022-02-01 LAB — TACROLIMUS LEVEL: TACROLIMUS BLOOD: 7.7 ng/mL (ref 2.0–20.0)

## 2022-02-04 NOTE — Unmapped (Signed)
Patient called to review safe OTC meds for cold symptoms. Went over medications.    Was seen at PCP and negative for strep and covid    Eating and drinking good    Will call if there are any other concerns

## 2022-02-09 DIAGNOSIS — Z79899 Other long term (current) drug therapy: Principal | ICD-10-CM

## 2022-02-09 DIAGNOSIS — Z940 Kidney transplant status: Principal | ICD-10-CM

## 2022-02-09 DIAGNOSIS — Z1159 Encounter for screening for other viral diseases: Principal | ICD-10-CM

## 2022-02-19 DIAGNOSIS — Z940 Kidney transplant status: Principal | ICD-10-CM

## 2022-02-19 MED ORDER — ENVARSUS XR 4 MG TABLET,EXTENDED RELEASE
ORAL_TABLET | Freq: Every day | ORAL | 11 refills | 30 days
Start: 2022-02-19 — End: ?

## 2022-02-19 MED ORDER — MG-PLUS-PROTEIN 133 MG TABLET
ORAL_TABLET | Freq: Two times a day (BID) | ORAL | 11 refills | 30 days
Start: 2022-02-19 — End: ?

## 2022-02-19 NOTE — Unmapped (Signed)
Pt request for RX Refill

## 2022-02-19 NOTE — Unmapped (Signed)
[facility] Refill Coordination Note    Specialty Medication(s) to be Shipped:   Transplant: Envarsus 1mg , Envarsus 4mg , mycophenolate mofetil 180mg , and Prednisone 5mg     Other medication(s) to be shipped:  mag-ox     Carolyn Kelley, DOB: 11/28/1990  Phone: [phone] (home)       All above HIPAA information was verified with patient.     Was a Nurse, learning disability used for this call? No    Completed refill call assessment today to schedule patient's medication shipment from the [facility] [phone]).  All relevant notes have been reviewed.     Specialty medication(s) and dose(s) confirmed: Regimen is correct and unchanged.   Changes to medications: [name]  Changes to insurance: No  New side effects reported not previously addressed with a pharmacist or physician: None reported  Questions for the pharmacist: No    Confirmed patient received a Conservation officer, historic buildings and a Surveyor, mining with first shipment. The patient will receive a drug information handout for each medication shipped and additional FDA Medication Guides as required.       DISEASE/MEDICATION-SPECIFIC INFORMATION        N/A    SPECIALTY MEDICATION ADHERENCE     Medication Adherence    Patient reported X missed doses in the last month: 0  Specialty Medication: ENVARSUS XR 1 mg Tb24 extended release tablet (tacrolimus)  Patient is on additional specialty medications: Yes  Additional Specialty Medications: ENVARSUS XR 4 mg Tb24 extended release tablet (tacrolimus)  Patient Reported Additional Medication X Missed Doses in the Last Month: 0  Patient is on more than two specialty medications: Yes  Specialty Medication: mycophenolate 180 MG EC tablet (MYFORTIC)  Patient Reported Additional Medication X Missed Doses in the Last Month: 0  Specialty Medication: predniSONE 5 MG tablet (DELTASONE)  Patient Reported Additional Medication X Missed Doses in the Last Month: 0 Were doses missed due to medication being on hold? No    Envarsus 1 mg: 10 days of medicine on hand   Envarsus 4 mg: 10 days of medicine on hand   mycophenolate 180 mg: 10 days of medicine on hand   prednisone 5 mg: 10 days of medicine on hand     REFERRAL TO PHARMACIST     Referral to the pharmacist: Not needed      SHIPPING     Shipping address confirmed in Epic.     Delivery Scheduled: Yes, Expected medication delivery date: 02/25/22.     Medication will be delivered via UPS to the prescription address in Epic WAM.    [name]   [facility] Specialty Technician

## 2022-02-20 ENCOUNTER — Ambulatory Visit
Admit: 2022-02-20 | Discharge: 2022-02-21 | Payer: PRIVATE HEALTH INSURANCE | Attending: Nephrology | Primary: Nephrology

## 2022-02-20 ENCOUNTER — Ambulatory Visit: Admit: 2022-02-20 | Discharge: 2022-02-21 | Payer: PRIVATE HEALTH INSURANCE

## 2022-02-20 DIAGNOSIS — Z940 Kidney transplant status: Principal | ICD-10-CM

## 2022-02-20 DIAGNOSIS — J069 Acute upper respiratory infection, unspecified: Principal | ICD-10-CM

## 2022-02-20 LAB — URINALYSIS WITH MICROSCOPY
BILIRUBIN UA: NEGATIVE
GLUCOSE UA: NEGATIVE
KETONES UA: NEGATIVE
LEUKOCYTE ESTERASE UA: NEGATIVE
NITRITE UA: NEGATIVE
PH UA: 6 (ref 5.0–9.0)
RBC UA: 20 /HPF — ABNORMAL HIGH (ref <4)
SPECIFIC GRAVITY UA: 1.025 (ref 1.005–1.030)
SQUAMOUS EPITHELIAL: 45 /HPF — ABNORMAL HIGH (ref 0–5)
UROBILINOGEN UA: 0.2
WBC UA: 5 /HPF (ref 0–5)

## 2022-02-20 LAB — CBC W/ AUTO DIFF
BASOPHILS ABSOLUTE COUNT: 0.1 10*9/L (ref 0.0–0.1)
BASOPHILS RELATIVE PERCENT: 2.8 %
EOSINOPHILS ABSOLUTE COUNT: 0.2 10*9/L (ref 0.0–0.5)
EOSINOPHILS RELATIVE PERCENT: 7.6 %
HEMATOCRIT: 35.4 % (ref 34.0–44.0)
HEMOGLOBIN: 11.6 g/dL (ref 11.3–14.9)
LYMPHOCYTES ABSOLUTE COUNT: 0.8 10*9/L — ABNORMAL LOW (ref 1.1–3.6)
LYMPHOCYTES RELATIVE PERCENT: 30 %
MEAN CORPUSCULAR HEMOGLOBIN CONC: 32.8 g/dL (ref 32.0–36.0)
MEAN CORPUSCULAR HEMOGLOBIN: 29.1 pg (ref 25.9–32.4)
MEAN CORPUSCULAR VOLUME: 88.6 fL (ref 77.6–95.7)
MEAN PLATELET VOLUME: 8.1 fL (ref 6.8–10.7)
MONOCYTES ABSOLUTE COUNT: 0.4 10*9/L (ref 0.3–0.8)
MONOCYTES RELATIVE PERCENT: 13.7 %
NEUTROPHILS ABSOLUTE COUNT: 1.3 10*9/L — ABNORMAL LOW (ref 1.8–7.8)
NEUTROPHILS RELATIVE PERCENT: 45.9 %
NUCLEATED RED BLOOD CELLS: 0 /100{WBCs} (ref <=4)
PLATELET COUNT: 169 10*9/L (ref 150–450)
RED BLOOD CELL COUNT: 4 10*12/L (ref 3.95–5.13)
RED CELL DISTRIBUTION WIDTH: 13.5 % (ref 12.2–15.2)
WBC ADJUSTED: 2.8 10*9/L — ABNORMAL LOW (ref 3.6–11.2)

## 2022-02-20 LAB — C3 COMPLEMENT: C3 COMPLEMENT: 73 mg/dL — ABNORMAL LOW (ref 90–170)

## 2022-02-20 LAB — C4 COMPLEMENT: C4 COMPLEMENT: 31.1 mg/dL (ref 12.0–36.0)

## 2022-02-20 LAB — BASIC METABOLIC PANEL
ANION GAP: 6 mmol/L (ref 5–14)
BLOOD UREA NITROGEN: 27 mg/dL — ABNORMAL HIGH (ref 9–23)
BUN / CREAT RATIO: 20
CALCIUM: 10 mg/dL (ref 8.7–10.4)
CHLORIDE: 109 mmol/L — ABNORMAL HIGH (ref 98–107)
CO2: 24.7 mmol/L (ref 20.0–31.0)
CREATININE: 1.34 mg/dL — ABNORMAL HIGH
EGFR CKD-EPI (2021) FEMALE: 54 mL/min/{1.73_m2} — ABNORMAL LOW (ref >=60)
GLUCOSE RANDOM: 96 mg/dL (ref 70–99)
POTASSIUM: 5 mmol/L — ABNORMAL HIGH (ref 3.4–4.8)
SODIUM: 140 mmol/L (ref 135–145)

## 2022-02-20 LAB — PHOSPHORUS: PHOSPHORUS: 3.5 mg/dL (ref 2.4–5.1)

## 2022-02-20 LAB — BK VIRUS QUANTITATIVE PCR, BLOOD: BK BLOOD RESULT: NOT DETECTED

## 2022-02-20 LAB — HEPATIC FUNCTION PANEL
ALBUMIN: 4 g/dL (ref 3.4–5.0)
ALKALINE PHOSPHATASE: 84 U/L (ref 46–116)
ALT (SGPT): 9 U/L — ABNORMAL LOW (ref 10–49)
AST (SGOT): 26 U/L (ref <=34)
BILIRUBIN DIRECT: 0.1 mg/dL (ref 0.00–0.30)
BILIRUBIN TOTAL: 0.4 mg/dL (ref 0.3–1.2)
PROTEIN TOTAL: 7.6 g/dL (ref 5.7–8.2)

## 2022-02-20 LAB — MAGNESIUM: MAGNESIUM: 1.8 mg/dL (ref 1.6–2.6)

## 2022-02-20 LAB — LIPID PANEL
CHOLESTEROL/HDL RATIO SCREEN: 4.8 — ABNORMAL HIGH (ref 1.0–4.5)
CHOLESTEROL: 179 mg/dL (ref <=200)
HDL CHOLESTEROL: 37 mg/dL — ABNORMAL LOW (ref 40–60)
LDL CHOLESTEROL CALCULATED: 129 mg/dL — ABNORMAL HIGH (ref 40–99)
NON-HDL CHOLESTEROL: 142 mg/dL — ABNORMAL HIGH (ref 70–130)
TRIGLYCERIDES: 64 mg/dL (ref 0–150)
VLDL CHOLESTEROL CAL: 12.8 mg/dL (ref 8–32)

## 2022-02-20 LAB — PROTEIN / CREATININE RATIO, URINE
CREATININE, URINE: 159.1 mg/dL
PROTEIN URINE: 31.3 mg/dL
PROTEIN/CREAT RATIO, URINE: 0.197

## 2022-02-20 LAB — CMV DNA, QUANTITATIVE, PCR: CMV VIRAL LD: NOT DETECTED

## 2022-02-20 LAB — ALBUMIN / CREATININE URINE RATIO
ALBUMIN QUANT URINE: 9.1 mg/dL
ALBUMIN/CREATININE RATIO: 57.2 ug/mg — ABNORMAL HIGH (ref 0.0–30.0)
CREATININE, URINE: 159.1 mg/dL

## 2022-02-20 MED ORDER — MG-PLUS-PROTEIN 133 MG TABLET
ORAL_TABLET | Freq: Two times a day (BID) | ORAL | 11 refills | 30 days | Status: CP
Start: 2022-02-20 — End: 2023-02-20
  Filled 2022-02-24: qty 120, 30d supply, fill #0

## 2022-02-20 MED ORDER — TACROLIMUS XR 4 MG TABLET,EXTENDED RELEASE 24 HR
ORAL_TABLET | Freq: Every day | ORAL | 11 refills | 30 days | Status: CP
Start: 2022-02-20 — End: ?
  Filled 2022-02-24: qty 30, 30d supply, fill #0

## 2022-02-20 NOTE — Unmapped (Signed)
Met w/ patient in [facility] today. Reviewed meds/symptoms. Any new medications?                 Fever/cold/flu symptoms c/o migraines x3d, sleepiness, sluggishness, cough, sore/raw throat, sinus congestion, diarrhea x 2-3d  BP: 126/82 today/ Home BP reported stable  BG: wnl  Headache/Dizziness/Lightheaded: migraine symptoms  Hand tremors: worse on days w/ Prednisone  Numbness/tingling: stable  Fevers/chills/sweats: denies  CP/SOB/palpatations: denies  Nausea/vomiting/heartburn: denies  Diarrhea/constipation: stable   UTI symptoms (burn/pain/itch/frequency/urgency/odor/color/foam): denies  No visible or palpable edema     Appetite good; reports adequate hydration.      Pt reports being well rested and getting adequate exercise.     Continues to follow Covid/health safety precautions by taking care to mask, perform frequent hand hygeine and minimal public activity. Offered support and guidance for this process given their immune-suppressed state. We discussed reduced covid vaccine coverage for transplant patients and importance of continuing to mask and practice safe distancing. Commented that booster vaccines will likely be advised as an ongoing process.      Last Envarsus taken 2300; held for this morning's labs. Current dose 7mg  daily; Myfortic 720mg  bid, Pred 5mg  daily - taking sporadically bc she states it makes her feel spastic and having muscle jerks     Other complaints or concerns: tested for URI - RPP swab today    Referrals needed: none     Immunization status: UTD Flu, Will get COVID booster today in clinic, Prevnar 20 due in 2026     Functional Score: 100

## 2022-02-20 NOTE — Unmapped (Signed)
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 She is seen in follow up of her kidney transplant and immunosuppression management. Issues addressed today include:    S/P deceased donor kidney transplant with C3 glomeruloneprhritis recurrence   - Serum creatinine is stable at 1.34 mg/dL (baseline 1.6.1 mg/dL).    - Kidney biopsy 12/29/19 confirmed presence of C3 glomerulopathy recurrence.   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   - Genetics panel was negative for causative variants 08/30/20.     - Treatment of C3G included an increase in the dose of Myfortic to 720 mg bid and initiation of prednisone 5 mg daily  - UPC has normalized at 0.197 with UACR 57.2. These values represent improvement.   - Rituximab was deferred at the time of recurrence diagnosis due to an active COVID-19 community spike and concern about infection risk. With improvement on current immunosuppression we have not reconsidered rituximab.  - She has been prepared for use of Eculizumab in case current therapy or rituximab proves ineffective in the future. This included completion of pre-eculizumab vaccination   - C3 is mildly reduced at 73 consistent with recent values  - DSA screen negative for DSAs most recently on 11/07/21  - BK viral load undetectable  - Will continue current immunosuppressive regimen    Immunosuppression Management [High Risk Medical Decision Making For Drug Therapy Requiring Intensive Monitoring For Toxicity]:   - Tacrolimus level is 8.6 after she took Envarsus at 2300 last night. The last true trough was 7.0 on 09/30/21 (target trough is 6-10 ng/mL).   - Tremors and headaches preceded initiation of Envarsus and are still present.  - Envarsus 7 mg daily dose to be continued   - Continue Myfortic 720 mg bid due to C3 glomerulopathy recurrence  - Prednisone 5 mg 3 days per week due to patient intolerance of daily dosing due to myoclonus  - S/P IUD placement     History of E.coli UTI, 09/26/21    - Presentation was consistent with urosepsis with AKI though blood cultures were negative  - Renal US 09/26/21 without abscesses  - Patient completed a 10 day course of Cipro  - Denies current UTI symptoms.   - UA today does not appear to be a clean catch  - Will await urine culture results    Infectious Prophylaxis and Monitoring: CMV & EBV: D+/R+  - PJP prophylaxis: completed (s/p Bactrim x 6 months)  - CMV prophylaxis: completed (s/p Valcyte x3 months).   - COVID-19 vaccination: Spikevax booster given today 02/20/22   - Received Men B on 01/26/20,03/01/20, 03/21/20, 11/07/21  - Received MenACWY 01/26/20, 04/29/11  - Men C 04/29/11  - Booster doses of meningococcal vaccine every 1-5 years per CDC recommendations if chronic eculizumab therapy is initiated.    Seizures Disorder, no seizures since 2014:    - EEG with no evidence of seizures on 06/25/20 .   - Keppra has been stopped with no recurrence of seizures  - Follow up with [name], NP at [facility].       Myoclonus:   - Patient linked worsening myoclonus with prednisone and is now taking prednisone only 3 days per week.  - Will continue prn clonazepam  - Continue [facility] physical therapy.  Metabolic Acidosis  CO2 is 24.7  - Continue sodium bicarbonate 650 mg bid      Anemia of CKD  - Hemoglobin is stable at 11.6     Pseudoaneurysm of AVF  She saw Dr. Norma Fredrickson 09/21/19 with plan to defer any intervention until 1 year has passed from transplant. She has no high output heart failure by echo or symptoms of CHF. We would favor a nonurgent revision to minimize any risk of rupture, but spare the AVF for future use if possible to do so safely. Patient has been advised to contact us with any scabbing or erosion of the skin over the pseudoaneurysm.    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    Pap Smear: 11/11/20 was negative including negative HPV  She will follow up with GYN     Follow up  3 months    History of Present Illness  Carolyn Kelley is a 31 y.o. 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 Her post-operative course was complicated by hypotension and hypoxia thought to be caused by an anaphylactic reaction to Campath. She has experienced no episodes of rejection or infectious complications. Due to presence of proteinuria she underwent kidney biopsy on 12/29/19 revealing C3 glomerulopathy recurrence. In response to this diagnosis Myfortic was increased from 540 mg bid to 720 mg bid and she was started on prednisone 5 mg daily while tacrolimus was continued.    She presents today stating she developed sore throat, fatigue, and a productive cough 2 weeks ago. These symptoms have resolved without directed treatment other than a mild cough. She denies chest pain, shortness of breath, hemoptysis, fever or chills. She denies dysuria, allograft tenderness, or edema. She denies nausea or vomiting, but does have intermittent loose bowel movements. She continues to have headaches and mild tremors. She has also noted myoclonus but denies seizures. She feels the myoclonus is worse on days she takes prednisone. She is still off keppra and takes clonazepam prn for myoclonus.  Home BP is unknown.       Transplant History:  Date of Transplant: 03/10/19  Organ Received: Left DDKT, DBD, SCD, KDPI 2%; cold ischemia 18 hrs.  Native Kidney Disease: C3 glomerulonephropathy  Pre-transplant CPRA:  0%  Post-Transplant Course: Complicated by anaphylactic reaction to Campath in OR  Induction: Campath  Date of Ureteral Stent Removal: 04/17/19  CMV/EBV Status: CMV D+/R+, EBV D+/R+  Rejection Episodes: None  Donor Specific Antibodies: Negative (most recent 01/26/20)   Biopsy 12/29/19: C3 glomerulopathy recurrence, no rejection    Other Past Medical History  1. C3 Glomerulopathy  2. Seizure Disorder  3. 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 HTN  5. 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 ADHD    Review of Systems  Otherwise as per HPI, all other systems reviewed and are negative.    Medications  Current Outpatient Medications   Medication Sig Dispense Refill   ??? acetaminophen (TYLENOL) 500 MG tablet Take 1-2 tablets (500-1,000 mg total) by mouth every six (6) hours as needed for pain or fever (> 38C). 100 tablet 0   ??? clonazePAM (KLONOPIN) 0.5 MG tablet Take 1 tablet (0.5 mg total) by mouth two (2) times a day. 60 tablet 3   ??? levETIRAcetam (KEPPRA) 250 MG tablet  TAKE 1 TABLET BY MOUTH TWICE DAILY. 180 tablet 3   ??? magnesium oxide-Mg AA chelate (MAGNESIUM, AMINO ACID CHELATE,) 133 mg Take 2 tablets by mouth Two (2) times a day. 120 tablet 11   ??? miSOPROStoL (CYTOTEC) 200 MCG tablet INSERT 1 TABLET VAGINALLY NIGHT PRIOR TO PROCEDURE     ??? mycophenolate (MYFORTIC) 180 MG EC tablet Take 4 tablets (720 mg total) by mouth Two (2) times a day. Adjust dose per medication card. 240 tablet 11   ??? norethindrone-ethinyl estradiol (JUNEL FE 1/20) 1 mg-20 mcg (21)/75 mg (7) per tablet Take 1 tablet by mouth daily. (Patient not taking: Reported on 10/03/2021)     ??? predniSONE (DELTASONE) 5 MG tablet Take 1 tablet (5 mg total) by mouth daily. 30 tablet 11   ??? sodium bicarbonate 650 mg tablet Take 1 tablet (650 mg total) by mouth Two (2) times a day. 60 tablet 11   ??? sodium bicarbonate, bulk, Powd Take 2 tablets by mouth daily. (Patient not taking: Reported on 11/07/2021)     ??? tacrolimus (ENVARSUS XR) 1 mg Tb24 extended release tablet Take three (1mg ) tablets with one (4mg ) tablet by mouth daily for a total daily dose of 7 mg. 90 tablet 11   ??? tacrolimus (ENVARSUS XR) 4 mg Tb24 extended release tablet Take one (4mg ) tablet with three (1mg ) tablets by mouth daily for a total daily dose of 7 mg. 30 tablet 11   ??? tretinoin (RETIN-A) 0.025 % cream Apply 1 application. topically nightly. Nightly as tolerated 45 g 3     No current facility-administered medications for this visit.       Physical Exam   BP 126/82 (BP Site: R Arm, BP Position: Sitting, BP Cuff Size: Large)  - Pulse 87  - Temp 36.4 ??C (97.6 ??F)  - Resp 18  - Ht 157.5 cm (5' 2)  - Wt 72.9 kg (160 lb 12.8 oz)  - BMI 29.41 kg/m??    General: Patient is a pleasant female appears drowsy seated in wheelchair  Eyes: Sclera anicteric.  Lungs: Clear to auscultation bilaterally, no wheezes/rales/rhonchi.  Cardiovascular: Regular rate and rhythm without murmurs, rubs or gallops.  Abdomen: soft, non-tender  Extremities: Without edema, joints without evidence of synovitis; pseudoaneurysm of AVF LUE  Skin: Without rash  Neurological: uses a walker for ambulation due to myoclonus.       Laboratory Results and imaging Reviewed in EMR

## 2022-02-21 LAB — TACROLIMUS LEVEL, TROUGH: TACROLIMUS, TROUGH: 8.6 ng/mL (ref 5.0–15.0)

## 2022-02-24 MED FILL — ENVARSUS XR 1 MG TABLET,EXTENDED RELEASE: ORAL | 30 days supply | Qty: 90 | Fill #1

## 2022-02-24 MED FILL — PREDNISONE 5 MG TABLET: ORAL | 30 days supply | Qty: 30 | Fill #2

## 2022-02-24 MED FILL — MYCOPHENOLATE SODIUM 180 MG TABLET,DELAYED RELEASE: ORAL | 30 days supply | Qty: 240 | Fill #3

## 2022-02-26 DIAGNOSIS — Z940 Kidney transplant status: Principal | ICD-10-CM

## 2022-02-26 DIAGNOSIS — Z79899 Other long term (current) drug therapy: Principal | ICD-10-CM

## 2022-02-26 DIAGNOSIS — Z1159 Encounter for screening for other viral diseases: Principal | ICD-10-CM

## 2022-02-26 NOTE — Unmapped (Signed)
[facility] orders.

## 2022-03-05 DIAGNOSIS — Z940 Kidney transplant status: Principal | ICD-10-CM

## 2022-03-09 NOTE — Unmapped (Signed)
Update unos

## 2022-03-24 NOTE — Unmapped (Signed)
[facility] Pharm[facility] Refill Coordination Note    Specialty Medication(s) to be Shipped:   Transplant: Envarsus 1mg , Envarsus 4mg ,  mycophenolic acid 180mg , and Prednisone 5mg     Other medication(s) to be shipped:  magnesium, sodium bicarb     Carolyn Kelley, DOB: April 06, 1990  Phone: [phone] (home)       All above HIPAA information was verified with patient.     Was a Nurse, learning disability used for this call? No    Completed refill call assessment today to schedule patient's medication shipment from the [facility] [phone]).  All relevant notes have been reviewed.     Specialty medication(s) and dose(s) confirmed: Regimen is correct and unchanged.   Changes to medications: Carolyn Kelley reports no changes at this time.  Changes to insurance: No  New side effects reported not previously addressed with a pharmacist or physician: None reported  Questions for the pharmacist: No    Confirmed patient received a Conservation officer, historic buildings and a Surveyor, mining with first shipment. The patient will receive a drug information handout for each medication shipped and additional FDA Medication Guides as required.       DISEASE/MEDICATION-SPECIFIC INFORMATION        N/A    SPECIALTY MEDICATION ADHERENCE     Medication Adherence    Patient reported X missed doses in the last month: 0  Specialty Medication: ENVARSUS XR 1 mg Tb24 extended release tablet (tacrolimus)  Patient is on additional specialty medications: Yes  Additional Specialty Medications: ENVARSUS XR 4 mg Tb24 extended release tablet (tacrolimus)  Patient Reported Additional Medication X Missed Doses in the Last Month: 0  Patient is on more than two specialty medications: Yes  Specialty Medication: mycophenolate 180 MG EC tablet (MYFORTIC)  Patient Reported Additional Medication X Missed Doses in the Last Month: 0  Specialty Medication: predniSONE 5 MG tablet (DELTASONE)  Patient Reported Additional Medication X Missed Doses in the Last Month: 0 Were doses missed due to medication being on hold? No    Envarsus 1 mg: ~7 days of medicine on hand   Envarsus 4 mg: ~7 days of medicine on hand   Prednisone 5 mg: ~7 days of medicine on hand   mycophenolate 180 mg: ~7 days of medicine on hand     REFERRAL TO PHARMACIST     Referral to the pharmacist: Not needed      [facility]     Shipping address confirmed in Epic.     Delivery Scheduled: Yes, Expected medication delivery date: 03/27/22.     Medication will be delivered via UPS to the prescription address in Epic WAM.    Arnold Long, PharmD   [facility] Specialty Pharmacist

## 2022-03-26 MED FILL — ENVARSUS XR 4 MG TABLET,EXTENDED RELEASE: ORAL | 30 days supply | Qty: 30 | Fill #1

## 2022-03-26 MED FILL — PREDNISONE 5 MG TABLET: ORAL | 30 days supply | Qty: 30 | Fill #3

## 2022-03-26 MED FILL — MG-PLUS-PROTEIN 133 MG TABLET: ORAL | 30 days supply | Qty: 120 | Fill #1

## 2022-03-26 MED FILL — MYCOPHENOLATE SODIUM 180 MG TABLET,DELAYED RELEASE: ORAL | 30 days supply | Qty: 240 | Fill #4

## 2022-03-26 MED FILL — ENVARSUS XR 1 MG TABLET,EXTENDED RELEASE: ORAL | 30 days supply | Qty: 90 | Fill #2

## 2022-03-26 MED FILL — SODIUM BICARBONATE 650 MG TABLET: ORAL | 30 days supply | Qty: 60 | Fill #1

## 2022-03-30 DIAGNOSIS — Z940 Kidney transplant status: Principal | ICD-10-CM

## 2022-04-15 NOTE — Unmapped (Signed)
[facility] Pharm[facility] Refill Coordination Note    Specialty Medication(s) to be Shipped:   Transplant: Envarsus 1mg , Envarsus 4mg ,  mycophenolic acid 180mg , and Prednisone 5mg     Other medication(s) to be shipped:  sodium bicarb, Mg     Carolyn Kelley, DOB: 24-Jul-1990  Phone: [phone] (home)       All above HIPAA information was verified with patient.     Was a Nurse, learning disability used for this call? No    Completed refill call assessment today to schedule patient's medication shipment from the [facility] [phone]).  All relevant notes have been reviewed.     Specialty medication(s) and dose(s) confirmed: Regimen is correct and unchanged.   Changes to medications: Naliya reports no changes at this time.  Changes to insurance: No  New side effects reported not previously addressed with a pharmacist or physician: None reported  Questions for the pharmacist: No    Confirmed patient received a Conservation officer, historic buildings and a Surveyor, mining with first shipment. The patient will receive a drug information handout for each medication shipped and additional FDA Medication Guides as required.       DISEASE/MEDICATION-SPECIFIC INFORMATION        N/A    SPECIALTY MEDICATION ADHERENCE     Medication Adherence    Patient reported X missed doses in the last month: 0  Specialty Medication: Envarsus Xr 1mg   Patient is on additional specialty medications: Yes  Additional Specialty Medications: Envarsus Xr 4mg   Patient Reported Additional Medication X Missed Doses in the Last Month: 0  Patient is on more than two specialty medications: Yes  Specialty Medication: Mycophenolate 180mg   Patient Reported Additional Medication X Missed Doses in the Last Month: 0  Specialty Medication: Prednisone 5mg   Patient Reported Additional Medication X Missed Doses in the Last Month: 0                                Were doses missed due to medication being on hold? No    Envarsus Xr 1 mg: 7 days of medicine on hand Envarsus Xr 4 mg: 7 days of medicine on hand   Prednisone 5 mg: 7 days of medicine on hand   Mycophenolate 180 mg: 7 days of medicine on hand     REFERRAL TO PHARMACIST     Referral to the pharmacist: Not needed      SHIPPING     Shipping address confirmed in Epic.     Delivery Scheduled: Yes, Expected medication delivery date: 04/22/22.     Medication will be delivered via UPS to the prescription address in Epic WAM.    Tera Helper, [facility]   [facility] Specialty Pharmacist

## 2022-04-21 MED FILL — SODIUM BICARBONATE 650 MG TABLET: ORAL | 30 days supply | Qty: 60 | Fill #2

## 2022-04-21 MED FILL — ENVARSUS XR 1 MG TABLET,EXTENDED RELEASE: ORAL | 30 days supply | Qty: 90 | Fill #3

## 2022-04-21 MED FILL — ENVARSUS XR 4 MG TABLET,EXTENDED RELEASE: ORAL | 30 days supply | Qty: 30 | Fill #2

## 2022-04-21 MED FILL — MG-PLUS-PROTEIN 133 MG TABLET: ORAL | 30 days supply | Qty: 120 | Fill #2

## 2022-04-21 MED FILL — PREDNISONE 5 MG TABLET: ORAL | 30 days supply | Qty: 30 | Fill #4

## 2022-04-21 MED FILL — MYCOPHENOLATE SODIUM 180 MG TABLET,DELAYED RELEASE: ORAL | 30 days supply | Qty: 240 | Fill #5

## 2022-04-22 ENCOUNTER — Other Ambulatory Visit: Payer: Self-pay | Admitting: Physical Medicine & Rehabilitation

## 2022-04-22 ENCOUNTER — Telehealth: Payer: Self-pay | Admitting: Physical Medicine & Rehabilitation

## 2022-04-22 DIAGNOSIS — G253 Myoclonus: Secondary | ICD-10-CM

## 2022-04-22 NOTE — Telephone Encounter (Signed)
Requesting prescription refill, did not state which medication.

## 2022-04-23 NOTE — Telephone Encounter (Signed)
Medication filled.

## 2022-04-27 DIAGNOSIS — Z940 Kidney transplant status: Principal | ICD-10-CM

## 2022-05-18 NOTE — Unmapped (Signed)
[facility] Special[facility] Pharm[facility] Refill Coordination Note    Specialty Medication(s) to be Shipped:   Transplant: Envarsus XR 4mg , Envarsus XR 1mg , and mycophenolate mofetil 180 EC mg    Other medication(s) to be shipped: No additional medications requested for fill at this time     Carolyn Kelley, DOB: 23-Apr-1990  Phone: [phone] (home)       All above HIPAA information was verified with patient.     Was a Nurse, learning disability used for this call? No    Completed refill call assessment today to schedule patient's medication shipment from the [facility] [phone]).  All relevant notes have been reviewed.     Specialty medication(s) and dose(s) confirmed: Regimen is correct and unchanged.   Changes to medications:  Carolyn Kelley states prednisone dose was changed due tremors.   Changes to insurance: No  New side effects reported not previously addressed with a pharmacist or physician: None reported  Questions for the pharmacist: No    Confirmed patient received a Conservation officer, historic buildings and a Surveyor, mining with first shipment. The patient will receive a drug information handout for each medication shipped and additional FDA Medication Guides as required.       DISEASE/MEDICATION-SPECIFIC INFORMATION        N/A    SPECIALTY MEDICATION ADHERENCE     Medication Adherence    Specialty Medication: envarsus 1mg   Patient is on additional specialty medications: Yes  Additional Specialty Medications: Envarsus 4mg   Patient is on more than two specialty medications: Yes  Specialty Medication: mycophenolate 180mg   Specialty Medication: prednisone 5mg               Were doses missed due to medication being on hold? No    ENVARSUS XR 1 mg Tb24 extended release tablet (tacrolimus)  : 7 days of medicine on hand   ENVARSUS XR 4 mg Tb24 extended release tablet (tacrolimus)  : 7 days of medicine on hand   mycophenolate 180 MG EC tablet (MYFORTIC)  : 7 days of medicine on hand   predniSONE 5 MG tablet (DELTASONE)  : 21 days of medicine on hand       REFERRAL TO PHARMACIST     Referral to the pharmacist: Not needed      [facility]     Shipping address confirmed in Epic.     Patient was notified of new phone menu : No    Delivery Scheduled: Yes, Expected medication delivery date: 05/22/22.     Medication will be delivered via UPS to the prescription address in Epic WAM.    Carolyn Kelley' W Carolyn Kelley Shared [facility] Specialty Technician

## 2022-05-22 MED FILL — MYCOPHENOLATE SODIUM 180 MG TABLET,DELAYED RELEASE: ORAL | 30 days supply | Qty: 240 | Fill #6

## 2022-05-22 MED FILL — ENVARSUS XR 1 MG TABLET,EXTENDED RELEASE: ORAL | 30 days supply | Qty: 90 | Fill #4

## 2022-05-22 MED FILL — ENVARSUS XR 4 MG TABLET,EXTENDED RELEASE: ORAL | 30 days supply | Qty: 30 | Fill #3

## 2022-05-25 DIAGNOSIS — Z940 Kidney transplant status: Principal | ICD-10-CM

## 2022-05-27 LAB — DECEASED DONOR CL I&II, LOW RES
DONOR LOW RES DRW #1: 52
DONOR LOW RES DRW #2: 52
DONOR LOW RES HLA A #1: 24
DONOR LOW RES HLA A #2: 24
DONOR LOW RES HLA B #1: 35
DONOR LOW RES HLA B #2: 39
DONOR LOW RES HLA BW #1: 6
DONOR LOW RES HLA C #1: 4
DONOR LOW RES HLA C #2: 7
DONOR LOW RES HLA DQ #1: 7
DONOR LOW RES HLA DQ #2: 7
DONOR LOW RES HLA DR #1: 11
DONOR LOW RES HLA DR #2: 14

## 2022-05-29 ENCOUNTER — Ambulatory Visit: Admit: 2022-05-29 | Discharge: 2022-05-30 | Payer: PRIVATE HEALTH INSURANCE

## 2022-05-29 ENCOUNTER — Ambulatory Visit
Admit: 2022-05-29 | Discharge: 2022-05-30 | Payer: PRIVATE HEALTH INSURANCE | Attending: Nephrology | Primary: Nephrology

## 2022-05-29 DIAGNOSIS — Z940 Kidney transplant status: Principal | ICD-10-CM

## 2022-05-29 LAB — MAGNESIUM: MAGNESIUM: 1.5 mg/dL — ABNORMAL LOW (ref 1.6–2.6)

## 2022-05-29 LAB — ALBUMIN / CREATININE URINE RATIO
ALBUMIN QUANT URINE: 13.9 mg/dL
ALBUMIN/CREATININE RATIO: 113.2 ug/mg — ABNORMAL HIGH (ref 0.0–30.0)
CREATININE, URINE: 122.8 mg/dL

## 2022-05-29 LAB — CBC W/ AUTO DIFF
BASOPHILS ABSOLUTE COUNT: 0 10*9/L (ref 0.0–0.1)
BASOPHILS RELATIVE PERCENT: 1.7 %
EOSINOPHILS ABSOLUTE COUNT: 0.2 10*9/L (ref 0.0–0.5)
EOSINOPHILS RELATIVE PERCENT: 6 %
HEMATOCRIT: 32.2 % — ABNORMAL LOW (ref 34.0–44.0)
HEMOGLOBIN: 10.8 g/dL — ABNORMAL LOW (ref 11.3–14.9)
LYMPHOCYTES ABSOLUTE COUNT: 0.9 10*9/L — ABNORMAL LOW (ref 1.1–3.6)
LYMPHOCYTES RELATIVE PERCENT: 31.9 %
MEAN CORPUSCULAR HEMOGLOBIN CONC: 33.4 g/dL (ref 32.0–36.0)
MEAN CORPUSCULAR HEMOGLOBIN: 29.9 pg (ref 25.9–32.4)
MEAN CORPUSCULAR VOLUME: 89.5 fL (ref 77.6–95.7)
MEAN PLATELET VOLUME: 8 fL (ref 6.8–10.7)
MONOCYTES ABSOLUTE COUNT: 0.4 10*9/L (ref 0.3–0.8)
MONOCYTES RELATIVE PERCENT: 15 %
NEUTROPHILS ABSOLUTE COUNT: 1.3 10*9/L — ABNORMAL LOW (ref 1.8–7.8)
NEUTROPHILS RELATIVE PERCENT: 45.4 %
NUCLEATED RED BLOOD CELLS: 0 /100{WBCs} (ref <=4)
PLATELET COUNT: 199 10*9/L (ref 150–450)
RED BLOOD CELL COUNT: 3.6 10*12/L — ABNORMAL LOW (ref 3.95–5.13)
RED CELL DISTRIBUTION WIDTH: 14.9 % (ref 12.2–15.2)
WBC ADJUSTED: 2.8 10*9/L — ABNORMAL LOW (ref 3.6–11.2)

## 2022-05-29 LAB — BASIC METABOLIC PANEL
ANION GAP: 6 mmol/L (ref 5–14)
BLOOD UREA NITROGEN: 26 mg/dL — ABNORMAL HIGH (ref 9–23)
BUN / CREAT RATIO: 20
CALCIUM: 9.8 mg/dL (ref 8.7–10.4)
CHLORIDE: 109 mmol/L — ABNORMAL HIGH (ref 98–107)
CO2: 24 mmol/L (ref 20.0–31.0)
CREATININE: 1.33 mg/dL — ABNORMAL HIGH
EGFR CKD-EPI (2021) FEMALE: 54 mL/min/{1.73_m2} — ABNORMAL LOW (ref >=60)
GLUCOSE RANDOM: 95 mg/dL (ref 70–99)
POTASSIUM: 4.4 mmol/L (ref 3.4–4.8)
SODIUM: 139 mmol/L (ref 135–145)

## 2022-05-29 LAB — URINALYSIS WITH MICROSCOPY
BILIRUBIN UA: NEGATIVE
GLUCOSE UA: NEGATIVE
KETONES UA: NEGATIVE
LEUKOCYTE ESTERASE UA: NEGATIVE
NITRITE UA: NEGATIVE
PH UA: 5.5 (ref 5.0–9.0)
PROTEIN UA: 30 — AB
RBC UA: 8 /HPF — ABNORMAL HIGH (ref <4)
SPECIFIC GRAVITY UA: 1.02 (ref 1.005–1.030)
SQUAMOUS EPITHELIAL: 20 /HPF — ABNORMAL HIGH (ref 0–5)
UROBILINOGEN UA: 0.2
WBC UA: 2 /HPF (ref 0–5)

## 2022-05-29 LAB — C4 COMPLEMENT: C4 COMPLEMENT: 23.7 mg/dL (ref 12.0–36.0)

## 2022-05-29 LAB — TACROLIMUS LEVEL, TROUGH: TACROLIMUS, TROUGH: 9.6 ng/mL (ref 5.0–15.0)

## 2022-05-29 LAB — HEPATIC FUNCTION PANEL
ALBUMIN: 3.6 g/dL (ref 3.4–5.0)
ALKALINE PHOSPHATASE: 98 U/L (ref 46–116)
ALT (SGPT): 19 U/L (ref 10–49)
AST (SGOT): 31 U/L (ref <=34)
BILIRUBIN DIRECT: <0.1 mg/dL (ref 0.00–0.30)
BILIRUBIN TOTAL: 0.2 mg/dL — ABNORMAL LOW (ref 0.3–1.2)
PROTEIN TOTAL: 6.9 g/dL (ref 5.7–8.2)

## 2022-05-29 LAB — SLIDE REVIEW

## 2022-05-29 LAB — PHOSPHORUS: PHOSPHORUS: 3.7 mg/dL (ref 2.4–5.1)

## 2022-05-29 LAB — PROTEIN / CREATININE RATIO, URINE
CREATININE, URINE: 122.8 mg/dL
PROTEIN URINE: 35.5 mg/dL
PROTEIN/CREAT RATIO, URINE: 0.289

## 2022-05-29 LAB — CMV DNA, QUANTITATIVE, PCR: CMV VIRAL LD: NOT DETECTED

## 2022-05-29 LAB — C3 COMPLEMENT: C3 COMPLEMENT: 78 mg/dL — ABNORMAL LOW (ref 90–170)

## 2022-05-29 LAB — BK VIRUS QUANTITATIVE PCR, BLOOD: BK BLOOD RESULT: NOT DETECTED

## 2022-05-29 NOTE — Unmapped (Signed)
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 She is seen in follow up of her kidney transplant and immunosuppression management.     S/P deceased donor kidney transplant with C3 glomeruloneprhritis recurrence   - Serum creatinine is stable at 1.33 mg/dL (baseline 1.6-1.0 mg/dL).    - Kidney biopsy 12/29/19 confirmed presence of C3 glomerulopathy recurrence.   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   - Genetics panel was negative for causative variants 08/30/20.     - Treatment of C3G included an increase in the dose of Myfortic to 720 mg bid and initiation of prednisone 5 mg daily  - UPC has improved with changes in chronic immunosuppression with today's UPC 0.289 and UACR 113.2.    - Rituximab was deferred at the time of recurrence diagnosis due to an active COVID-19 community spike and concern about infection risk. With improvement in proteinuria on current immunosuppression we have not reconsidered rituximab.  - She has been prepared for use of Eculizumab in case current therapy or rituximab proves ineffective in the future. This included completion of pre-eculizumab vaccination     - C3 is mildly reduced at 78 consistent with recent values  - DSA screens have been historically negative for DSAs (most recently 11/07/21)  - BK viral load undetectable 05/29/22  - No changes in therapy or repeat biopsy currently warranted    Immunosuppression Management [High Risk Medical Decision Making For Drug Therapy Requiring Intensive Monitoring For Toxicity]:   - Tacrolimus level is 9.6 (target trough is 6-10 ng/mL).   - Tremors and headaches preceded initiation of Envarsus and are still present.  - Envarsus 7 mg daily dose to be continued   - Continue Myfortic 720 mg bid due to C3 glomerulopathy recurrence  - Prednisone 5 mg 3 days per week due to patient intolerance of daily dosing due to myoclonus  - S/P IUD placement     Anemia  - Hemoglobin 10.8   - Will check iron saturation with next lab draw  - Anemia may be related to Myfortic     Leukopenia, neutropenia  - WBC 2.8 with ANC 1.3   - Likely due to Myfortic  - No dose change in Myfortic is necessary at this level of leukopenia    History of E.coli UTI, 09/26/21    - Presentation was consistent with urosepsis with AKI though blood cultures were negative  - Renal US 09/26/21 without abscesses  - Patient completed a 10 day course of Cipro  - Denies current UTI symptoms.   - UA today does not appear to be a clean catch  - Will await urine culture results    Infectious Prophylaxis and Monitoring: CMV & EBV: D+/R+  - PJP prophylaxis: completed (s/p Bactrim x 6 months)  - CMV prophylaxis: completed (s/p Valcyte x3 months).   - COVID-19 vaccination: Spikevax booster given today 02/20/22   - Received Men B on 01/26/20,03/01/20, 03/21/20, 11/07/21  - Received MenACWY 01/26/20, 04/29/11  - Men C 04/29/11  - Booster doses of meningococcal vaccine every 1-5 years per CDC recommendations if chronic eculizumab therapy is initiated.    Seizures Disorder, no seizures since 2014:    - EEG with no evidence of seizures on 06/25/20 .   - Keppra was  stopped with no recurrence of seizures  - Follow up with [name], NP at [facility].       Myoclonus:   - Patient linked worsening myoclonus with prednisone and is now taking prednisone only 3 days per week.  - Will continue prn clonazepam  - Continue [facility] physical therapy.      Metabolic Acidosis  - CO2 is 24.0  - Continue sodium bicarbonate 650 mg bid      Anemia of CKD  - Hemoglobin is stable at 11.6     Pseudoaneurysm of AVF  She saw Dr. Norma Fredrickson 09/21/19 with plan to defer any intervention until 1 year had passed from transplant. She has no high output heart failure by echo or symptoms of CHF. We would favor a nonurgent revision to minimize any risk of rupture, but spare the AVF for future use if possible to do so safely. Patient has been advised to contact us with any scabbing or erosion of the skin over the pseudoaneurysm.    Health Maintenance:   Pap Smear: 12/08/18 showed HSIL [facility]). 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    Pap Smear: 11/11/20 was negative including negative HPV  She will follow up with GYN   Renal US 04/26/20 with no masses    Follow up  4 months    History of Present Illness  Carolyn Kelley is a 32 y.o. 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 Her post-operative course was complicated by hypotension and hypoxia thought to be caused by an anaphylactic reaction to Campath. She has experienced no episodes of rejection or infectious complications. Due to presence of proteinuria she underwent kidney biopsy on 12/29/19 revealing C3 glomerulopathy recurrence. In response to this diagnosis Myfortic was increased from 540 mg bid to 720 mg bid and she was started on prednisone 5 mg daily while tacrolimus was continued.    She presents today without new complaints. She continues to have headaches and mild tremors. The headaches occur approximately 1 to 2 times per week. BP at home is unknown. Myoclonus is unchanged. She denies seizures. She remains off keppra and takes clonazepam prn for myoclonus.    She denies dysuria, gross hematuria, cough, chest pain, shortness of breath, edema, nausea, vomiting, diarrhea. The pseudoaneurysm site is unchanged.     Immunosuppression remains Envarsus 7 mg daily, Myfortic 720 mg bid, and prednisone 5 mg 3 days per week.     Last dose of Envarsus: 8 AM yesterday    Transplant History:  Date of Transplant: 03/10/19  Organ Received: Left DDKT, DBD, SCD, KDPI 2%; cold ischemia 18 hrs.  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    Medications  Current Outpatient Medications   Medication Sig Dispense Refill  acetaminophen (TYLENOL) 500 MG tablet Take 1-2 tablets (500-1,000 mg total) by mouth every six (6) hours as needed for pain or fever (> 38C). 100 tablet 0    clonazePAM (KLONOPIN) 0.5 MG tablet Take 1 tablet (0.5 mg total) by mouth two (2) times a day. 60 tablet 3    levETIRAcetam (KEPPRA) 250 MG tablet TAKE 1 TABLET BY MOUTH TWICE DAILY. 180 tablet 3    magnesium oxide-Mg AA chelate (MAGNESIUM, AMINO ACID CHELATE,) 133 mg Take 2 tablets by mouth Two (2) times a day. 120 tablet 11    miSOPROStoL (CYTOTEC) 200 MCG tablet INSERT 1 TABLET VAGINALLY NIGHT PRIOR TO PROCEDURE      mycophenolate (MYFORTIC) 180 MG EC tablet Take 4 tablets (720 mg total) by mouth Two (2) times a day. Adjust dose per medication card. 240 tablet 11    norethindrone-ethinyl estradiol (JUNEL FE 1/20) 1 mg-20 mcg (21)/75 mg (7) per tablet Take 1 tablet by mouth daily. (Patient not taking: Reported on 10/03/2021)      predniSONE (DELTASONE) 5 MG tablet Take 1 tablet (5 mg total) by mouth daily. 30 tablet 11    sodium bicarbonate 650 mg tablet Take 1 tablet (650 mg total) by mouth Two (2) times a day. 60 tablet 11    sodium bicarbonate, bulk, Powd Take 2 tablets by mouth daily. (Patient not taking: Reported on 11/07/2021)      tacrolimus (ENVARSUS XR) 1 mg Tb24 extended release tablet Take three (1mg ) tablets with one (4mg ) tablet by mouth daily for a total daily dose of 7 mg. 90 tablet 11    tacrolimus (ENVARSUS XR) 4 mg Tb24 extended release tablet Take one (4mg ) tablet with three (1mg ) tablets by mouth daily for a total daily dose of 7 mg. 30 tablet 11    tretinoin (RETIN-A) 0.025 % cream Apply 1 application. topically nightly. Nightly as tolerated 45 g 3     No current facility-administered medications for this visit.       Physical Exam   BP 93/69 (BP Site: R Arm, BP Position: Sitting, BP Cuff Size: Medium)  - Pulse 96  - Temp 36.2 ??C (97.2 ??F) (Temporal)  - Ht 157.5 cm (5' 2)  - Wt 78.2 kg (172 lb 6.4 oz)  - LMP  (LMP Unknown)  - BMI 31.53 kg/m??    General: Patient is a pleasant female appears drowsy seated in wheelchair  Eyes: Sclera anicteric.  Lungs: Clear to auscultation bilaterally, no wheezes/rales/rhonchi.  Cardiovascular: Regular rate and rhythm without murmurs, rubs or gallops.  Abdomen: soft, non-tender  Extremities: Without edema, joints without evidence of synovitis; pseudoaneurysm of AVF LUE  Skin: Without rash  Neurological: uses a walker for ambulation due to myoclonus.       Laboratory Results and imaging Reviewed in EMR

## 2022-05-29 NOTE — Unmapped (Signed)
Saw patient in clinic. Just returned from a trip to [place].    Recent cold vs allergies. Feeling better now.    Drinking good    Appetite good    BP not checking    Labs today, took envarsus at 11am    +HA     +tremors    Denies dizziness, CP, heart palpitations, SOB, abdominal pain, n/v/d, urinary problems, numbness/tingling, fevers, or swelling

## 2022-06-02 ENCOUNTER — Ambulatory Visit: Payer: Medicaid Other | Admitting: Neurology

## 2022-06-12 NOTE — Unmapped (Signed)
[facility] Refill Coordination Note    Specialty Medication(s) to be Shipped:   Transplant: Envarsus XR 4mg , Envarsus XR 1mg , and mycophenolate mofetil 180 EC mg    Other medication(s) to be shipped:  sodium bicarbonate     Carolyn Kelley, DOB: [birth year]  Phone: [phone] (home)       All above HIPAA information was verified with patient.     Was a Nurse, learning disability used for this call? No    Completed refill call assessment today to schedule patient's medication shipment from the [facility] [phone]).  All relevant notes have been reviewed.     Specialty medication(s) and dose(s) confirmed: Regimen is correct and unchanged.   Changes to medications: [name] reports no changes at this time.  Changes to insurance: No  New side effects reported not previously addressed with a pharmacist or physician: None reported  Questions for the pharmacist: No    Confirmed patient received a Conservation officer, historic buildings and a Surveyor, mining with first shipment. The patient will receive a drug information handout for each medication shipped and additional FDA Medication Guides as required.       DISEASE/MEDICATION-SPECIFIC INFORMATION        N/A    SPECIALTY MEDICATION ADHERENCE     Medication Adherence    Patient reported X missed doses in the last month: 0  Specialty Medication: ENVARSUS XR 1 mg Tb24 extended release tablet (tacrolimus)  Patient is on additional specialty medications: Yes  Additional Specialty Medications: ENVARSUS XR 4 mg Tb24 extended release tablet (tacrolimus)  Patient Reported Additional Medication X Missed Doses in the Last Month: 0  Patient is on more than two specialty medications: Yes  Specialty Medication: mycophenolate 180 MG EC tablet (MYFORTIC)  Patient Reported Additional Medication X Missed Doses in the Last Month: 0              Were doses missed due to medication being on hold? No    ENVARSUS XR 1 mg Tb24 extended release tablet (tacrolimus)  : 10 days of medicine on hand   ENVARSUS XR 4 mg Tb24 extended release tablet (tacrolimus)  : 10 days of medicine on hand   mycophenolate 180 MG EC tablet (MYFORTIC)  : 10 days of medicine on hand         REFERRAL TO PHARMACIST     Referral to the pharmacist: Not needed      [facility]     Shipping address confirmed in Epic.     Patient was notified of new phone menu : Yes    Delivery Scheduled: Yes, Expected medication delivery date: 06/19/22.     Medication will be delivered via UPS to the prescription address in Epic WAM.    Ernestine Mcmurray   [facility] Specialty Technician

## 2022-06-18 MED FILL — MYCOPHENOLATE SODIUM 180 MG TABLET,DELAYED RELEASE: ORAL | 30 days supply | Qty: 240 | Fill #7

## 2022-06-18 MED FILL — ENVARSUS XR 4 MG TABLET,EXTENDED RELEASE: ORAL | 30 days supply | Qty: 30 | Fill #4

## 2022-06-18 MED FILL — ENVARSUS XR 1 MG TABLET,EXTENDED RELEASE: ORAL | 30 days supply | Qty: 90 | Fill #5

## 2022-06-18 MED FILL — SODIUM BICARBONATE 650 MG TABLET: ORAL | 30 days supply | Qty: 60 | Fill #3

## 2022-06-22 DIAGNOSIS — Z940 Kidney transplant status: Principal | ICD-10-CM

## 2022-07-16 NOTE — Unmapped (Signed)
[facility] Refill Coordination Note    Specialty Medication(s) to be Shipped:   Transplant: Envarsus 1mg , Envarsus 4mg , mycophenolate mofetil 180mg , and Prednisone 5mg     Other medication(s) to be shipped:  mag-ox and sodium bicarb     Carolyn Kelley, DOB: 06/16/1990  Phone: [phone] (home)       All above HIPAA information was verified with patient.     Was a Nurse, learning disability used for this call? No    Completed refill call assessment today to schedule patient's medication shipment from the [facility] [phone]).  All relevant notes have been reviewed.     Specialty medication(s) and dose(s) confirmed: Regimen is correct and unchanged.   Changes to medications: Carolyn Kelley reports no changes at this time.  Changes to insurance: No  New side effects reported not previously addressed with a pharmacist or physician: None reported  Questions for the pharmacist: No    Confirmed patient received a Conservation officer, historic buildings and a Surveyor, mining with first shipment. The patient will receive a drug information handout for each medication shipped and additional FDA Medication Guides as required.       DISEASE/MEDICATION-SPECIFIC INFORMATION        N/A    SPECIALTY MEDICATION ADHERENCE     Medication Adherence    Patient reported X missed doses in the last month: 0  Specialty Medication: predniSONE 5 MG tablet (DELTASONE)  Patient is on additional specialty medications: Yes  Additional Specialty Medications: ENVARSUS XR 1 mg Tb24 extended release tablet (tacrolimus)  Patient Reported Additional Medication X Missed Doses in the Last Month: 0  Patient is on more than two specialty medications: Yes  Specialty Medication: ENVARSUS XR 4 mg Tb24 extended release tablet (tacrolimus)  Patient Reported Additional Medication X Missed Doses in the Last Month: 0  Specialty Medication: mycophenolate 180 MG EC tablet (MYFORTIC)  Patient Reported Additional Medication X Missed Doses in the Last Month: 0 Were doses missed due to medication being on hold? No    Envarsus 1 mg: 4 days of medicine on hand   Envarsus 4 mg: 4 days of medicine on hand   prednisone 5 mg: 4 days of medicine on hand   mycophenolate 180 mg: 4 days of medicine on hand       REFERRAL TO PHARMACIST     Referral to the pharmacist: Not needed      SHIPPING     Shipping address confirmed in Epic.       Delivery Scheduled: Yes, Expected medication delivery date: 07/20/22.     Medication will be delivered via UPS to the prescription address in Epic WAM.    [name]   [facility] Specialty Technician

## 2022-07-17 MED FILL — PREDNISONE 5 MG TABLET: ORAL | 30 days supply | Qty: 30 | Fill #5

## 2022-07-17 MED FILL — MG-PLUS-PROTEIN 133 MG TABLET: ORAL | 30 days supply | Qty: 120 | Fill #3

## 2022-07-17 MED FILL — MYCOPHENOLATE SODIUM 180 MG TABLET,DELAYED RELEASE: ORAL | 30 days supply | Qty: 240 | Fill #8

## 2022-07-17 MED FILL — ENVARSUS XR 1 MG TABLET,EXTENDED RELEASE: ORAL | 30 days supply | Qty: 90 | Fill #6

## 2022-07-17 MED FILL — ENVARSUS XR 4 MG TABLET,EXTENDED RELEASE: ORAL | 30 days supply | Qty: 30 | Fill #5

## 2022-07-17 MED FILL — SODIUM BICARBONATE 650 MG TABLET: ORAL | 30 days supply | Qty: 60 | Fill #4

## 2022-07-20 ENCOUNTER — Other Ambulatory Visit: Payer: Self-pay | Admitting: Physical Medicine & Rehabilitation

## 2022-07-20 DIAGNOSIS — G253 Myoclonus: Secondary | ICD-10-CM

## 2022-07-20 DIAGNOSIS — Z940 Kidney transplant status: Principal | ICD-10-CM

## 2022-07-21 NOTE — Telephone Encounter (Signed)
Nees an office visit for further refills. She can see Riley Lam for this visit.

## 2022-07-22 ENCOUNTER — Encounter: Payer: Medicaid Other | Attending: Physical Medicine & Rehabilitation | Admitting: Physical Medicine & Rehabilitation

## 2022-07-22 ENCOUNTER — Encounter: Payer: Self-pay | Admitting: Physical Medicine & Rehabilitation

## 2022-07-22 VITALS — BP 129/85 | HR 96 | Ht 62.0 in | Wt 155.0 lb

## 2022-07-22 DIAGNOSIS — G253 Myoclonus: Secondary | ICD-10-CM | POA: Insufficient documentation

## 2022-07-22 MED ORDER — CLONAZEPAM 0.5 MG PO TABS
0.5000 mg | ORAL_TABLET | Freq: Two times a day (BID) | ORAL | 3 refills | Status: AC
Start: 2022-07-22 — End: ?

## 2022-07-22 NOTE — Patient Instructions (Signed)
ALWAYS FEEL FREE TO CALL OUR OFFICE WITH ANY PROBLEMS OR QUESTIONS ([phone])  **PLEASE NOTE** ALL MEDICATION REFILL REQUESTS (INCLUDING CONTROLLED SUBSTANCES) NEED TO BE MADE AT LEAST 7 DAYS PRIOR TO REFILL BEING DUE. ANY REFILL REQUESTS INSIDE THAT TIME FRAME MAY RESULT IN DELAYS IN RECEIVING YOUR PRESCRIPTION.                    

## 2022-07-22 NOTE — Progress Notes (Signed)
Subjective:    Patient ID: Heather Sims, female    DOB: [birth year], 32 y.o.   MRN: [id number]  HPI  Ms Boxley is here in follow up of her ABI and associated myoclonus. I last saw her a year ago. She remains on clonazepam for control of her symptoms. It has generally controlled things for her.  She was placed on prednisone for her kidneys which increased her myoclonus, but she stopped it, and sx returned to baseline. She uses a walker for longer distance gait. She recently visited [place] on her own and did well walking around on her own.   She is about to graduate from college with a degree in psychology. She is planning to take a break from education for a while to pursue some other ventures.     Pain Inventory Average Pain 0 Pain Right Now 0 My pain is  no pain  LOCATION OF PAIN  no pain  BOWEL Number of stools per week: 7 Oral laxative use No  Type of laxative . Enema or suppository use No  History of colostomy No  Incontinent No   BLADDER Normal In and out cath, frequency . Able to self cath  . Bladder incontinence No  Frequent urination No  Leakage with coughing No  Difficulty starting stream No  Incomplete bladder emptying No    Mobility use a walker how many minutes can you walk? 30-60 ability to climb steps?  yes do you drive?  no  Function disabled: date disabled .  Neuro/Psych tremor trouble walking anxiety  Prior Studies Any changes since last visit?  no  Physicians involved in your care Nephrologist   Family History  Problem Relation Age of Onset   Hypertension Mother    Diabetes Father    Hyperlipidemia Father    Social History   Socioeconomic History   Marital status: Single    Spouse name: Not on file   Number of children: 0   Years of education: college jr   Highest education level: Not on file  Occupational History   Not on file  Tobacco Use   Smoking status: Never   Smokeless tobacco: Never  Vaping Use   Vaping  Use: Never used  Substance and Sexual Activity   Alcohol use: No    Alcohol/week: 0.0 standard drinks of alcohol   Drug use: No   Sexual activity: Never    Birth control/protection: Abstinence  Other Topics Concern   Not on file  Social History Narrative   Lives at home with her mother & brother   Right handed      ** Merged History Encounter **       Social Determinants of Health   Financial Resource Strain: Not on file  Food Insecurity: Not on file  Transportation Needs: Not on file  Physical Activity: Not on file  Stress: Not on file  Social Connections: Not on file   Past Surgical History:  Procedure Laterality Date   AV FISTULA PLACEMENT  12/18/2011   Procedure: ARTERIOVENOUS (AV) FISTULA CREATION;  Surgeon: Chuck Hint, MD;  Location: [facility] OR;  Service: Vascular;  Laterality: Left;   INSERTION OF DIALYSIS CATHETER Right 08/06/2014   Procedure: INSERTION OF DIALYSIS CATHETER;  Surgeon: Sherren Kerns, MD;  Location: [facility] OR;  Service: Vascular;  Laterality: Right;   RENAL BIOPSY     REVISON OF ARTERIOVENOUS FISTULA Left 08/06/2014   Procedure: PLICATION OF ARTERIOVENOUS FISTULA;  Surgeon: Sherren Kerns, MD;  Location: [facility] OR;  Service: Vascular;  Laterality: Left;   REVISON OF ARTERIOVENOUS FISTULA Left 03/12/2017   Procedure: REVISION PLICATION OF ARTERIOVENOUS FISTULA LEFT UPPER ARM;  Surgeon: Nada Libman, MD;  Location: [facility] OR;  Service: Vascular;  Laterality: Left;   Past Medical History:  Diagnosis Date   Anemia    Brain injury (HCC)    Cardiac arrest (HCC) 03/26/2011   Cardiomyopathy    CHF (congestive heart failure) (HCC)    Chronic kidney disease    [facility]- [place]   Depression    Family history of adverse reaction to anesthesia    Mother hard to wake up.   Gait disorder 01/11/2013   Heart failure    History of blood transfusion 11/2011   Hypertension    Ileitis    MPGN (membranoproliferative glomerulonephritis), type 2    Pneumonia     'walking pneumonia'   Renal disorder    Seizures (HCC)    2013- last time   There were no vitals taken for this visit.  Opioid Risk Score:   Fall Risk Score:  `1  Depression screen [facility] 2/9     07/23/2021    9:26 AM 10/18/2017    9:12 AM 07/02/2014   12:33 PM  Depression screen PHQ 2/9  Decreased Interest 0 1 1  Down, Depressed, Hopeless 0 1 1  PHQ - 2 Score 0 2 2  Altered sleeping   1  Tired, decreased energy   1  Change in appetite   0  Feeling bad or failure about yourself    2  Trouble concentrating   0  Moving slowly or fidgety/restless   0  Suicidal thoughts   0  PHQ-9 Score   6     Review of Systems  Musculoskeletal:  Positive for gait problem.  Neurological:  Positive for tremors.  All other systems reviewed and are negative.     Objective:   Physical Exam   General: No acute distress HEENT: NCAT, EOMI, oral membranes moist Cards: reg rate  Chest: normal effort Abdomen: Soft, NT, ND Skin: dry, intact Extremities: no edema Psych: pleasant and appropriate  Musculoskeletal:   Skin:       General: Skin is warm and dry.  Neurological:     Mental Status: She is alert and oriented to person, place, and time.very engaging. Improved insight and awareness.   No clonus. Some steppage to gait at times but really very fluid.  Ambulated without walker for me.  Balance excellent. Strength 5/5                   Assessment & Plan:  1. 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 2. Myoclonus: Continue Klonopin                - 0.5mg  bid  --still weaning off keppra Controlled Substance Reporting system. 4. Seizure Disorder: Neurology Following. Continue to Monitor.  5. ESRD: S/P Transplant: Nephrology Following.  6. Attention deficits, memory dysfunction             -doing well , graduating from college!   22 minutes of face to face patient care time was spent during this visit. All questions were encouraged and  answered. f/u in a year.

## 2022-08-01 DIAGNOSIS — L709 Acne, unspecified: Principal | ICD-10-CM

## 2022-08-01 MED ORDER — TRETINOIN 0.025 % TOPICAL CREAM
3 refills | 0 days
Start: 2022-08-01 — End: ?

## 2022-08-03 MED ORDER — TRETINOIN 0.025 % TOPICAL CREAM
3 refills | 0 days | Status: CP
Start: 2022-08-03 — End: ?

## 2022-08-10 NOTE — Unmapped (Signed)
[facility] Refill Coordination Note    Specialty Medication(s) to be Shipped:   Transplant: Envarsus 1mg , Envarsus 4mg , mycophenolate mofetil 180mg , and Prednisone 5mg     Other medication(s) to be shipped:  sodium and mag-ox      Carolyn Kelley, DOB: [birth year]  Phone: [phone] (home)       All above HIPAA information was verified with patient.     Was a Nurse, learning disability used for this call? No    Completed refill call assessment today to schedule patient's medication shipment from the [facility] [phone]).  All relevant notes have been reviewed.     Specialty medication(s) and dose(s) confirmed: Regimen is correct and unchanged.   Changes to medications: Meital reports no changes at this time.  Changes to insurance: No  New side effects reported not previously addressed with a pharmacist or physician: None reported  Questions for the pharmacist: No    Confirmed patient received a Conservation officer, historic buildings and a Surveyor, mining with first shipment. The patient will receive a drug information handout for each medication shipped and additional FDA Medication Guides as required.       DISEASE/MEDICATION-SPECIFIC INFORMATION        N/A    SPECIALTY MEDICATION ADHERENCE     Medication Adherence    Patient reported X missed doses in the last month: 0  Specialty Medication: predniSONE 5 MG tablet (DELTASONE)  Patient is on additional specialty medications: Yes  Additional Specialty Medications: ENVARSUS XR 1 mg Tb24 extended release tablet (tacrolimus)  Patient Reported Additional Medication X Missed Doses in the Last Month: 0  Patient is on more than two specialty medications: Yes  Specialty Medication: ENVARSUS XR 4 mg Tb24 extended release tablet (tacrolimus)  Patient Reported Additional Medication X Missed Doses in the Last Month: 0  Specialty Medication: mycophenolate 180 MG EC tablet (MYFORTIC)  Patient Reported Additional Medication X Missed Doses in the Last Month: 0 Were doses missed due to medication being on hold? No    Envarsus 1 mg: 8 days of medicine on hand   Envarsus 4 mg: 8 days of medicine on hand   mycophenolate 180 mg: 8 days of medicine on hand   prednisone 5 mg: 8 days of medicine on hand       REFERRAL TO PHARMACIST     Referral to the pharmacist: Not needed      SHIPPING     Shipping address confirmed in Epic.       Delivery Scheduled: Yes, Expected medication delivery date: 08/14/22.     Medication will be delivered via UPS to the prescription address in Epic WAM.    [name]   [facility] Specialty Technician

## 2022-08-13 MED FILL — PREDNISONE 5 MG TABLET: ORAL | 30 days supply | Qty: 30 | Fill #6

## 2022-08-13 MED FILL — MG-PLUS-PROTEIN 133 MG TABLET: ORAL | 30 days supply | Qty: 120 | Fill #4

## 2022-08-13 MED FILL — ENVARSUS XR 1 MG TABLET,EXTENDED RELEASE: ORAL | 30 days supply | Qty: 90 | Fill #7

## 2022-08-13 MED FILL — ENVARSUS XR 4 MG TABLET,EXTENDED RELEASE: ORAL | 30 days supply | Qty: 30 | Fill #6

## 2022-08-13 MED FILL — MYCOPHENOLATE SODIUM 180 MG TABLET,DELAYED RELEASE: ORAL | 30 days supply | Qty: 240 | Fill #9

## 2022-08-13 MED FILL — SODIUM BICARBONATE 650 MG TABLET: ORAL | 30 days supply | Qty: 60 | Fill #5

## 2022-08-17 DIAGNOSIS — Z940 Kidney transplant status: Principal | ICD-10-CM

## 2022-09-10 NOTE — Unmapped (Signed)
[facility] Refill Coordination Note    Specialty Medication(s) to be Shipped:   Transplant: Envarsus 4mg , Envarsus 1mg , mycophenolate mofetil 180mg , and Prednisone 5mg     Other medication(s) to be shipped:  mag-ox     Carolyn Kelley, DOB: [birth year]  Phone: [phone] (home)       All above HIPAA information was verified with patient.     Was a Nurse, learning disability used for this call? No    Completed refill call assessment today to schedule patient's medication shipment from the [facility] [phone]).  All relevant notes have been reviewed.     Specialty medication(s) and dose(s) confirmed: Regimen is correct and unchanged.   Changes to medications: Lajoya reports no changes at this time.  Changes to insurance: No  New side effects reported not previously addressed with a pharmacist or physician: None reported  Questions for the pharmacist: No    Confirmed patient received a Conservation officer, historic buildings and a Surveyor, mining with first shipment. The patient will receive a drug information handout for each medication shipped and additional FDA Medication Guides as required.       DISEASE/MEDICATION-SPECIFIC INFORMATION        N/A    SPECIALTY MEDICATION ADHERENCE     Medication Adherence    Patient reported X missed doses in the last month: 0  Specialty Medication: ENVARSUS XR 1 mg Tb24 extended release tablet (tacrolimus)  Patient is on additional specialty medications: Yes  Additional Specialty Medications: ENVARSUS XR 4 mg Tb24 extended release tablet (tacrolimus)  Patient Reported Additional Medication X Missed Doses in the Last Month: 0  Patient is on more than two specialty medications: Yes  Specialty Medication: mycophenolate 180 MG EC tablet (MYFORTIC)  Patient Reported Additional Medication X Missed Doses in the Last Month: 0  Specialty Medication: predniSONE 5 MG tablet (DELTASONE)  Patient Reported Additional Medication X Missed Doses in the Last Month: 0              Were doses missed due to medication being on hold? No    Envarsus 1 mg: 5 days of medicine on hand   Envarsus 4 mg: 5 days of medicine on hand   prednisone 5 mg: 5 days of medicine on hand   mycophenolate 180 mg: 5 days of medicine on hand       REFERRAL TO PHARMACIST     Referral to the pharmacist: Not needed      [facility]     Shipping address confirmed in Epic.       Delivery Scheduled: Yes, Expected medication delivery date: 09/14/22.     Medication will be delivered via UPS to the prescription address in Epic [place].    [name]   [facility] Specialty Technician

## 2022-09-11 MED FILL — MYCOPHENOLATE SODIUM 180 MG TABLET,DELAYED RELEASE: ORAL | 30 days supply | Qty: 240 | Fill #10

## 2022-09-11 MED FILL — SODIUM BICARBONATE 650 MG TABLET: ORAL | 30 days supply | Qty: 60 | Fill #6

## 2022-09-11 MED FILL — ENVARSUS XR 1 MG TABLET,EXTENDED RELEASE: ORAL | 30 days supply | Qty: 90 | Fill #8

## 2022-09-11 MED FILL — MG-PLUS-PROTEIN 133 MG TABLET: ORAL | 30 days supply | Qty: 120 | Fill #5

## 2022-09-11 MED FILL — PREDNISONE 5 MG TABLET: ORAL | 30 days supply | Qty: 30 | Fill #7

## 2022-09-11 MED FILL — ENVARSUS XR 4 MG TABLET,EXTENDED RELEASE: ORAL | 30 days supply | Qty: 30 | Fill #7

## 2022-09-14 DIAGNOSIS — Z940 Kidney transplant status: Principal | ICD-10-CM

## 2022-10-09 DIAGNOSIS — Z940 Kidney transplant status: Principal | ICD-10-CM

## 2022-10-12 DIAGNOSIS — Z940 Kidney transplant status: Principal | ICD-10-CM

## 2022-10-12 NOTE — Unmapped (Signed)
[facility] Refill Coordination Note    Specialty Medication(s) to be Shipped:   Transplant: Envarsus 4mg , Envarsus 1mg , mycophenolate mofetil 180mg , and Prednisone 5mg     Other medication(s) to be shipped:  magnesium (AA chelate)133 mg, sodium bicarbonate 650 mg     Carolyn Kelley, DOB: [birth year]  Phone: [phone] (home)       All above HIPAA information was verified with patient.     Was a Nurse, learning disability used for this call? No    Completed refill call assessment today to schedule patient's medication shipment from the [facility] [phone]).  All relevant notes have been reviewed.     Specialty medication(s) and dose(s) confirmed: Regimen is correct and unchanged.   Changes to medications: Mahi reports no changes at this time.  Changes to insurance: No  New side effects reported not previously addressed with a pharmacist or physician: None reported  Questions for the pharmacist: No    Confirmed patient received a Conservation officer, historic buildings and a Surveyor, mining with first shipment. The patient will receive a drug information handout for each medication shipped and additional FDA Medication Guides as required.       DISEASE/MEDICATION-SPECIFIC INFORMATION        N/A    SPECIALTY MEDICATION ADHERENCE     Medication Adherence    Patient reported X missed doses in the last month: 0  Specialty Medication: ENVARSUS XR 1 mg Tb24 extended release tablet (tacrolimus)  Patient is on additional specialty medications: Yes  Additional Specialty Medications: ENVARSUS XR 4 mg Tb24 extended release tablet (tacrolimus)  Patient Reported Additional Medication X Missed Doses in the Last Month: 0  Patient is on more than two specialty medications: Yes  Specialty Medication: mycophenolate 180 MG EC tablet (MYFORTIC)  Specialty Medication: predniSONE 5 MG tablet (DELTASONE)              Were doses missed due to medication being on hold? No    Envarsus 1 mg: 5 days of medicine on hand   Envarsus 4 mg: 5 days of medicine on hand   prednisone 5 mg: 5 days of medicine on hand   mycophenolate 180 mg: 5 days of medicine on hand       REFERRAL TO PHARMACIST     Referral to the pharmacist: Not needed      [facility]     Shipping address confirmed in Epic.       Delivery Scheduled: Yes, Expected medication delivery date: 10/14/22.     Medication will be delivered via UPS to the prescription address in Epic WAM.    Carolyn Kelley   [facility] Specialty Technician

## 2022-10-13 MED FILL — ENVARSUS XR 1 MG TABLET,EXTENDED RELEASE: ORAL | 30 days supply | Qty: 90 | Fill #9

## 2022-10-13 MED FILL — MYCOPHENOLATE SODIUM 180 MG TABLET,DELAYED RELEASE: ORAL | 30 days supply | Qty: 240 | Fill #11

## 2022-10-13 MED FILL — ENVARSUS XR 4 MG TABLET,EXTENDED RELEASE: ORAL | 30 days supply | Qty: 30 | Fill #8

## 2022-10-13 MED FILL — PREDNISONE 5 MG TABLET: ORAL | 30 days supply | Qty: 30 | Fill #8

## 2022-10-13 MED FILL — SODIUM BICARBONATE 650 MG TABLET: ORAL | 30 days supply | Qty: 60 | Fill #7

## 2022-10-13 MED FILL — MG-PLUS-PROTEIN 133 MG TABLET: ORAL | 30 days supply | Qty: 120 | Fill #6

## 2022-11-05 DIAGNOSIS — Z940 Kidney transplant status: Principal | ICD-10-CM

## 2022-11-05 MED ORDER — MYCOPHENOLATE SODIUM 180 MG TABLET,DELAYED RELEASE
ORAL_TABLET | Freq: Two times a day (BID) | ORAL | 11 refills | 30 days | Status: CP
Start: 2022-11-05 — End: 2023-11-05
  Filled 2022-11-17: qty 240, 30d supply, fill #0

## 2022-11-05 NOTE — Unmapped (Signed)
[facility] Refill Coordination Note    Specialty Medication(s) to be Shipped:   Transplant: Envarsus 1mg , Envarsus 4mg , and mycophenolate mofetil 180mg     Other medication(s) to be shipped:  prednisone,magnesium, sodium bicarbonate      Carolyn Kelley, DOB: 1990-09-21  Phone: [phone] (home)       All above HIPAA information was verified with patient.     Was a Nurse, learning disability used for this call? No    Completed refill call assessment today to schedule patient's medication shipment from the [facility] [phone]).  All relevant notes have been reviewed.     Specialty medication(s) and dose(s) confirmed: Regimen is correct and unchanged.   Changes to medications: Shawntrice reports no changes at this time.  Changes to insurance: No  New side effects reported not previously addressed with a pharmacist or physician: None reported  Questions for the pharmacist: No    Confirmed patient received a Conservation officer, historic buildings and a Surveyor, mining with first shipment. The patient will receive a drug information handout for each medication shipped and additional FDA Medication Guides as required.       DISEASE/MEDICATION-SPECIFIC INFORMATION        N/A    SPECIALTY MEDICATION ADHERENCE     Medication Adherence    Patient reported X missed doses in the last month: 0  Specialty Medication: mycophenolate 180 MG EC tablet (MYFORTIC)  Patient is on additional specialty medications: Yes  Additional Specialty Medications: ENVARSUS XR 4 mg Tb24 extended release tablet (tacrolimus)  Patient Reported Additional Medication X Missed Doses in the Last Month: 0  Patient is on more than two specialty medications: Yes  Specialty Medication: ENVARSUS XR 1 mg Tb24 extended release tablet (tacrolimus)  Patient Reported Additional Medication X Missed Doses in the Last Month: 0              Were doses missed due to medication being on hold? No      ENVARSUS XR 1 mg Tb24 extended release tablet (tacrolimus): 14 days of medicine on hand     ENVARSUS XR 4 mg Tb24 extended release tablet (tacrolimus): 14 days of medicine on hand     mycophenolate 180 MG EC tablet (MYFORTIC): 14 days of medicine on hand       REFERRAL TO PHARMACIST     Referral to the pharmacist: Not needed      [facility]     Shipping address confirmed in Epic.       Delivery Scheduled: Yes, Expected medication delivery date: 11/18/2022.     Medication will be delivered via UPS to the prescription address in Epic [place].    Arsenio Schnorr J Helane Gunther   [facility] Specialty Technician

## 2022-11-05 NOTE — Unmapped (Signed)
Pt request for RX Refill

## 2022-11-09 DIAGNOSIS — Z940 Kidney transplant status: Principal | ICD-10-CM

## 2022-11-10 ENCOUNTER — Telehealth: Payer: Self-pay | Admitting: Physical Medicine & Rehabilitation

## 2022-11-10 NOTE — Telephone Encounter (Signed)
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 For example how it can be difficult for her to lift certain items. Patient states a letter has been completed for her before when she attended another school . Patient needs letter by next Friday 8/30

## 2022-11-11 ENCOUNTER — Telehealth: Payer: Self-pay | Admitting: *Deleted

## 2022-11-11 ENCOUNTER — Encounter: Payer: Self-pay | Admitting: Physical Medicine & Rehabilitation

## 2022-11-11 NOTE — Telephone Encounter (Signed)
I completed a letter for her today. She can come pick it up

## 2022-11-11 NOTE — Telephone Encounter (Signed)
LVM  letter ready for pick up ?

## 2022-11-11 NOTE — Telephone Encounter (Signed)
Letter requested has been written. I spoke with Heather Sims and she wanted to creat MyChart and have it sent to her through [employer]. That has been done.

## 2022-11-16 DIAGNOSIS — Z940 Kidney transplant status: Principal | ICD-10-CM

## 2022-11-17 MED FILL — SODIUM BICARBONATE 650 MG TABLET: ORAL | 30 days supply | Qty: 60 | Fill #8

## 2022-11-17 MED FILL — MG-PLUS-PROTEIN 133 MG TABLET: ORAL | 30 days supply | Qty: 120 | Fill #7

## 2022-11-17 MED FILL — PREDNISONE 5 MG TABLET: ORAL | 30 days supply | Qty: 30 | Fill #9

## 2022-11-17 MED FILL — ENVARSUS XR 1 MG TABLET,EXTENDED RELEASE: ORAL | 30 days supply | Qty: 90 | Fill #10

## 2022-11-17 MED FILL — ENVARSUS XR 4 MG TABLET,EXTENDED RELEASE: ORAL | 30 days supply | Qty: 30 | Fill #9

## 2022-11-20 ENCOUNTER — Other Ambulatory Visit: Admit: 2022-11-20 | Discharge: 2022-11-20 | Payer: PRIVATE HEALTH INSURANCE

## 2022-11-20 ENCOUNTER — Ambulatory Visit: Admit: 2022-11-20 | Discharge: 2022-11-20 | Payer: PRIVATE HEALTH INSURANCE

## 2022-11-20 ENCOUNTER — Ambulatory Visit
Admit: 2022-11-20 | Discharge: 2022-11-20 | Payer: PRIVATE HEALTH INSURANCE | Attending: Nephrology | Primary: Nephrology

## 2022-11-20 DIAGNOSIS — D849 Immunodeficiency, unspecified: Principal | ICD-10-CM

## 2022-11-20 DIAGNOSIS — N058 Unspecified nephritic syndrome with other morphologic changes: Principal | ICD-10-CM

## 2022-11-20 DIAGNOSIS — D500 Iron deficiency anemia secondary to blood loss (chronic): Principal | ICD-10-CM

## 2022-11-20 DIAGNOSIS — Z940 Kidney transplant status: Principal | ICD-10-CM

## 2022-11-20 DIAGNOSIS — Z9225 Personal history of immunosupression therapy: Principal | ICD-10-CM

## 2022-11-20 LAB — PROTEIN / CREATININE RATIO, URINE
CREATININE, URINE: 146.8 mg/dL
PROTEIN URINE: 45.7 mg/dL
PROTEIN/CREAT RATIO, URINE: 0.311

## 2022-11-20 LAB — BASIC METABOLIC PANEL
ANION GAP: 7 mmol/L (ref 5–14)
BLOOD UREA NITROGEN: 28 mg/dL — ABNORMAL HIGH (ref 9–23)
BUN / CREAT RATIO: 20
CALCIUM: 10.4 mg/dL (ref 8.7–10.4)
CHLORIDE: 111 mmol/L — ABNORMAL HIGH (ref 98–107)
CO2: 22 mmol/L (ref 20.0–31.0)
CREATININE: 1.41 mg/dL — ABNORMAL HIGH
EGFR CKD-EPI (2021) FEMALE: 51 mL/min/{1.73_m2} — ABNORMAL LOW (ref >=60)
GLUCOSE RANDOM: 89 mg/dL (ref 70–179)
POTASSIUM: 4.8 mmol/L (ref 3.4–4.8)
SODIUM: 140 mmol/L (ref 135–145)

## 2022-11-20 LAB — SLIDE REVIEW

## 2022-11-20 LAB — HEPATIC FUNCTION PANEL
ALBUMIN: 4.2 g/dL (ref 3.4–5.0)
ALKALINE PHOSPHATASE: 82 U/L (ref 46–116)
ALT (SGPT): 18 U/L (ref 10–49)
AST (SGOT): 36 U/L — ABNORMAL HIGH (ref <=34)
BILIRUBIN DIRECT: 0.1 mg/dL (ref 0.00–0.30)
BILIRUBIN TOTAL: 0.3 mg/dL (ref 0.3–1.2)
PROTEIN TOTAL: 7.5 g/dL (ref 5.7–8.2)

## 2022-11-20 LAB — PHOSPHORUS: PHOSPHORUS: 3.4 mg/dL (ref 2.4–5.1)

## 2022-11-20 LAB — HEMOGLOBIN A1C
ESTIMATED AVERAGE GLUCOSE: 103 mg/dL
HEMOGLOBIN A1C: 5.2 % (ref 4.8–5.6)

## 2022-11-20 LAB — CBC W/ AUTO DIFF
BASOPHILS ABSOLUTE COUNT: 0 10*9/L (ref 0.0–0.1)
BASOPHILS RELATIVE PERCENT: 1.5 %
EOSINOPHILS ABSOLUTE COUNT: 0.2 10*9/L (ref 0.0–0.5)
EOSINOPHILS RELATIVE PERCENT: 5.1 %
HEMATOCRIT: 34.4 % (ref 34.0–44.0)
HEMOGLOBIN: 11.1 g/dL — ABNORMAL LOW (ref 11.3–14.9)
LYMPHOCYTES ABSOLUTE COUNT: 1 10*9/L — ABNORMAL LOW (ref 1.1–3.6)
LYMPHOCYTES RELATIVE PERCENT: 31 %
MEAN CORPUSCULAR HEMOGLOBIN CONC: 32.2 g/dL (ref 32.0–36.0)
MEAN CORPUSCULAR HEMOGLOBIN: 29.2 pg (ref 25.9–32.4)
MEAN CORPUSCULAR VOLUME: 90.4 fL (ref 77.6–95.7)
MEAN PLATELET VOLUME: 8 fL (ref 6.8–10.7)
MONOCYTES ABSOLUTE COUNT: 0.4 10*9/L (ref 0.3–0.8)
MONOCYTES RELATIVE PERCENT: 11.7 %
NEUTROPHILS ABSOLUTE COUNT: 1.6 10*9/L — ABNORMAL LOW (ref 1.8–7.8)
NEUTROPHILS RELATIVE PERCENT: 50.7 %
PLATELET COUNT: 215 10*9/L (ref 150–450)
RED BLOOD CELL COUNT: 3.8 10*12/L — ABNORMAL LOW (ref 3.95–5.13)
RED CELL DISTRIBUTION WIDTH: 14.1 % (ref 12.2–15.2)
WBC ADJUSTED: 3.1 10*9/L — ABNORMAL LOW (ref 3.6–11.2)

## 2022-11-20 LAB — ALBUMIN / CREATININE URINE RATIO
ALBUMIN QUANT URINE: 19.9 mg/dL
ALBUMIN/CREATININE RATIO: 135.3 ug/mg — ABNORMAL HIGH (ref 0.0–30.0)
CREATININE, URINE: 147.1 mg/dL

## 2022-11-20 LAB — LIPID PANEL
CHOLESTEROL/HDL RATIO SCREEN: 4.5 (ref 1.0–4.5)
CHOLESTEROL: 177 mg/dL (ref <=200)
HDL CHOLESTEROL: 39 mg/dL — ABNORMAL LOW (ref 40–60)
LDL CHOLESTEROL CALCULATED: 122 mg/dL — ABNORMAL HIGH (ref 40–99)
NON-HDL CHOLESTEROL: 138 mg/dL — ABNORMAL HIGH (ref 70–130)
TRIGLYCERIDES: 80 mg/dL (ref 0–150)
VLDL CHOLESTEROL CAL: 16 mg/dL (ref 8–32)

## 2022-11-20 LAB — URINALYSIS WITH MICROSCOPY
BILIRUBIN UA: NEGATIVE
GLUCOSE UA: NEGATIVE
HYALINE CASTS: 2 /LPF — ABNORMAL HIGH (ref 0–1)
KETONES UA: NEGATIVE
LEUKOCYTE ESTERASE UA: NEGATIVE
NITRITE UA: NEGATIVE
PH UA: 5.5 (ref 5.0–9.0)
PROTEIN UA: 30 — AB
RBC UA: 34 /HPF — ABNORMAL HIGH (ref <=4)
SPECIFIC GRAVITY UA: 1.019 (ref 1.003–1.030)
SQUAMOUS EPITHELIAL: 14 /HPF — ABNORMAL HIGH (ref 0–5)
UROBILINOGEN UA: <2
WBC UA: 2 /HPF (ref 0–5)

## 2022-11-20 LAB — MAGNESIUM: MAGNESIUM: 1.6 mg/dL (ref 1.6–2.6)

## 2022-11-20 LAB — PARATHYROID HORMONE (PTH): PARATHYROID HORMONE INTACT: 178.4 pg/mL — ABNORMAL HIGH (ref 18.4–80.1)

## 2022-11-20 LAB — TACROLIMUS LEVEL, TROUGH: TACROLIMUS, TROUGH: 9 ng/mL (ref 5.0–15.0)

## 2022-11-20 NOTE — Unmapped (Signed)
Patient referred for ligation of AVF. Now 3 years out from transplant.

## 2022-11-20 NOTE — Unmapped (Signed)
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 She is seen in follow up of her kidney transplant and immunosuppression management.     S/P deceased donor kidney transplant with C3 glomeruloneprhritis recurrence   - Serum creatinine is stable at 1.41 mg/dL (baseline 1.6-1.0 mg/dL).    - Kidney biopsy 12/29/19 confirmed presence of C3 glomerulopathy recurrence.   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   - Genetics panel was negative for causative variants 08/30/20.     - Treatment of C3G included an increase in the dose of Myfortic to 720 mg bid and initiation of prednisone 5 mg daily  - UPC improved with changes in chronic immunosuppression. Today's UPC 0.311 and UACR 135.3.  UA with 34 RBCs, 2 WBCs  - Rituximab was deferred at the time of recurrence diagnosis due to an active COVID-19 community spike and concern about infection risk. With improvement in proteinuria on current immunosuppression we have not reconsidered rituximab.  - She has been prepared for use of Eculizumab in case current therapy or rituximab proves ineffective in the future. This included completion of pre-eculizumab vaccination     - C3 is pending. Last level low at 78 on 05/29/22  - DSA screens have been historically negative for DSAs (most recently 11/07/21)  - BK viral load undetectable 05/29/22  - No changes in therapy or repeat biopsy currently warranted    Immunosuppression Management [High Risk Medical Decision Making For Drug Therapy Requiring Intensive Monitoring For Toxicity]:   - Tacrolimus level is 9.0 (target trough is 6-10 ng/mL).   - Tremors and headaches preceded initiation of Envarsus and are still present.  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 presentation  consistent with urosepsis with AKI, renal US 09/26/21 without abscesses or stones, completed a 10 day course of Cipro  - Denies current UTI symptoms.   - UA today does not appear to be a clean catch  - Will await urine culture results    Infectious Prophylaxis and Monitoring: CMV & EBV: D+/R+  - PJP prophylaxis: completed (s/p Bactrim x 6 months)  - CMV prophylaxis: completed (s/p Valcyte x3 months).   - COVID-19 vaccination: Spikevax booster 02/20/22. Advised to get another booster this month.   - Received Men B on 01/26/20,03/01/20, 03/21/20, 11/07/21  - Received MenACWY 01/26/20, 04/29/11  - Men C 04/29/11  - Booster doses of meningococcal vaccine every 1-5 years per CDC recommendations if chronic eculizumab therapy is initiated.  - Flu vaccine recommended    Seizures Disorder, no seizures since 2014:    - EEG with no evidence of seizures on 06/25/20 .   -  Keppra stopped with no recurrence of seizures  - Follow up with [name], NP at [facility].       Myoclonus:   - Patient linked worsening myoclonus with prednisone and is now taking prednisone only 3 days per week.  - Will continue prn clonazepam  - Continue [facility] physical therapy.      Metabolic Acidosis  - CO2 is 22  - Continue sodium bicarbonate 650 mg bid      Pseudoaneurysm of AVF  She saw Dr. Norma Fredrickson 09/21/19 with plan to defer any intervention until 1 year had passed from transplant. She has no high output heart failure by echo or symptoms of CHF. We would favor a nonurgent revision to minimize any risk of rupture, but spare the AVF for future use if possible to do so safely. Patient has been advised to contact us with any scabbing or erosion of the skin over the pseudoaneurysm.    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    Pap Smear: 11/11/20 was negative including negative HPV  She was advised today to follow up with GYN   Renal US 04/26/20 with no masses in native kidneys    Follow up  4 months    History of Present Illness  Carolyn Kelley is a 32 y.o. 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 Her post-operative course was complicated by hypotension and hypoxia thought to be caused by an anaphylactic reaction to Campath. She has experienced no episodes of rejection or infectious complications. Due to presence of proteinuria she underwent kidney biopsy on 12/29/19 revealing C3 glomerulopathy recurrence. In response to this diagnosis Myfortic was increased from 540 mg bid to 720 mg bid and she was started on prednisone 5 mg daily while tacrolimus was continued. Creatinine has subsequently been stable and UPC  has remained < 0.5.     She has been weaned off Keppra. She denies seizure activity. Klonopin is still being used for myoclonus. She noted tremors and daily headaches. The headaches are mainly frontal in location with some light sensitivity. She takes Tylenol with some relief. BP at home has been in the 120's systolic.     She denies dysuria, gross hematuria, cough, chest pain, shortness of breath, edema, nausea, vomiting, diarrhea, or constipation.  The pseudoaneurysm site is unchanged.     Immunosuppression remains Envarsus 7 mg daily, Myfortic 720 mg bid, and prednisone 5 mg 3 days per week.     Last dose of Envarsus: 11 AM yesterday    Transplant History:  Date of Transplant: 03/10/19  Organ Received: Left DDKT, DBD, SCD, KDPI 2%; cold ischemia 18 hrs.  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  Medications  Current Outpatient Medications   Medication Sig Dispense Refill    acetaminophen (TYLENOL) 500 MG tablet Take 1-2 tablets (500-1,000 mg total) by mouth every six (6) hours as needed for pain or fever (> 38C). 100 tablet 0    clonazePAM (KLONOPIN) 0.5 MG tablet Take 1 tablet (0.5 mg total) by mouth two (2) times a day. 60 tablet 3    magnesium oxide-Mg AA chelate (MAGNESIUM, AMINO ACID CHELATE,) 133 mg Take 2 tablets by mouth Two (2) times a day. 120 tablet 11    mycophenolate (MYFORTIC) 180 MG EC tablet Take 4 tablets (720 mg total) by mouth Two (2) times a day. Adjust dose per medication card. 240 tablet 11    predniSONE (DELTASONE) 5 MG tablet Take 1 tablet (5 mg total) by mouth daily. 30 tablet 11    sodium bicarbonate 650 mg tablet Take 1 tablet (650 mg total) by mouth Two (2) times a day. 60 tablet 11    tacrolimus (ENVARSUS XR) 1 mg Tb24 extended release tablet Take three (1mg ) tablets with one (4mg ) tablet by mouth daily for a total daily dose of 7 mg. 90 tablet 11    tacrolimus (ENVARSUS XR) 4 mg Tb24 extended release tablet Take one (4mg ) tablet with three (1mg ) tablets by mouth daily for a total daily dose of 7 mg. 30 tablet 11    tretinoin (RETIN-A) 0.025 % cream APPLY TO AFFECTED AREA(S) EVERY NIGHT AT BEDTIME AS TOLERATED 45 g 3    levETIRAcetam (KEPPRA) 250 MG tablet TAKE 1 TABLET BY MOUTH TWICE DAILY. 180 tablet 3    miSOPROStoL (CYTOTEC) 200 MCG tablet INSERT 1 TABLET VAGINALLY NIGHT PRIOR TO PROCEDURE      norethindrone-ethinyl estradiol (JUNEL FE 1/20) 1 mg-20 mcg (21)/75 mg (7) per tablet Take 1 tablet by mouth daily.      sodium bicarbonate, bulk, Powd Take 2 tablets by mouth daily. (Patient not taking: Reported on 11/20/2022)       No current facility-administered medications for this visit.       Physical Exam   BP 129/88 (BP Site: R Arm, BP Position: Sitting, BP Cuff Size: Large)  - Pulse 86  - Temp 36.7 ??C (98.1 ??F) (Temporal)  - Ht 157.5 cm (5' 2)  - Wt 77.7 kg (171 lb 3.2 oz)  - LMP 11/03/2022 (Approximate)  - BMI 31.31 kg/m??    General: Patient is a pleasant female appears drowsy seated in wheelchair  Eyes: Sclera anicteric.  Lungs: Clear to auscultation bilaterally, no wheezes/rales/rhonchi.  Cardiovascular: Regular rate and rhythm without murmurs, rubs or gallops.  Abdomen: soft, non-tender  Extremities: Without edema, joints without evidence of synovitis; pseudoaneurysm of AVF LUE with no scabs or further thinning of skin over site.  Skin: Without rash  Neurological: uses a walker for ambulation due to myoclonus.       Laboratory Results and imaging Reviewed in EMR

## 2022-11-20 NOTE — Unmapped (Signed)
Order placed for referral to transplant surgery r/t pt's pseudoaneurysm of AVF.

## 2022-11-20 NOTE — Unmapped (Signed)
Saw patient in clinic. Reports she is doing well    Drinking at least 48 ounces.     Sleeping ok    Appetite good     BP not checking at home 129/88 in clinic    +HA about the same    +tremors about the same    Stopped Keppra, had weaned    Denies dizziness, CP< heart palpitations, SOB, abdominal pain, n/v/d, urinary problems, numbness/tingling, swelling, or fevers    Labs, needs to get today, last took envarsus at 11am yesterday

## 2022-11-21 LAB — C3 COMPLEMENT: C3 COMPLEMENT: 99 mg/dL (ref 90–170)

## 2022-11-21 LAB — BK VIRUS QUANTITATIVE PCR, BLOOD: BK BLOOD RESULT: NOT DETECTED

## 2022-11-21 LAB — IRON PANEL
IRON SATURATION: 29 % (ref 20–55)
IRON: 71 ug/dL
TOTAL IRON BINDING CAPACITY: 246 ug/dL — ABNORMAL LOW (ref 250–425)

## 2022-11-21 LAB — CMV DNA, QUANTITATIVE, PCR: CMV VIRAL LD: NOT DETECTED

## 2022-11-25 LAB — VITAMIN D 25 HYDROXY: VITAMIN D, TOTAL (25OH): 36.6 ng/mL (ref 20.0–80.0)

## 2022-11-27 LAB — FSAB CLASS 1 ANTIBODY SPECIFICITY: HLA CLASS 1 ANTIBODY RESULT: NEGATIVE

## 2022-11-27 LAB — HLA DS POST TRANSPLANT
ANTI-DONOR DRW #1 MFI: 131 MFI
ANTI-DONOR DRW #2 MFI: 235 MFI
ANTI-DONOR HLA-A #1 MFI: 9 MFI
ANTI-DONOR HLA-A #2 MFI: 9 MFI
ANTI-DONOR HLA-C #1 MFI: 45 MFI
ANTI-DONOR HLA-C #2 MFI: 131 MFI
ANTI-DONOR HLA-DP #2 MFI: 179 MFI
ANTI-DONOR HLA-DQB #1 MFI: 379 MFI
ANTI-DONOR HLA-DQB #2 MFI: 466 MFI
ANTI-DONOR HLA-DR #1 MFI: 122 MFI

## 2022-11-27 LAB — FSAB CLASS 2 ANTIBODY SPECIFICITY: HLA CL2 AB RESULT: POSITIVE

## 2022-12-07 DIAGNOSIS — Z940 Kidney transplant status: Principal | ICD-10-CM

## 2022-12-17 NOTE — Unmapped (Signed)
[facility] Clinical Assessment & Refill Coordination Note    Carolyn Kelley, DOB: 06-05-1990  Phone: [phone] (home)     All above HIPAA information was verified with patient.     Was a Nurse, learning disability used for this call? No    Specialty Medication(s):   Transplant: Envarsus 1mg , Envarsus 4mg ,  mycophenolic acid 180mg , and Prednisone 5mg      Current Outpatient Medications   Medication Sig Dispense Refill    acetaminophen (TYLENOL) 500 MG tablet Take 1-2 tablets (500-1,000 mg total) by mouth every six (6) hours as needed for pain or fever (> 38C). 100 tablet 0    clonazePAM (KLONOPIN) 0.5 MG tablet Take 1 tablet (0.5 mg total) by mouth two (2) times a day. 60 tablet 3    levETIRAcetam (KEPPRA) 250 MG tablet TAKE 1 TABLET BY MOUTH TWICE DAILY. 180 tablet 3    magnesium oxide-Mg AA chelate (MAGNESIUM, AMINO ACID CHELATE,) 133 mg Take 2 tablets by mouth Two (2) times a day. 120 tablet 11    miSOPROStoL (CYTOTEC) 200 MCG tablet INSERT 1 TABLET VAGINALLY NIGHT PRIOR TO PROCEDURE      mycophenolate (MYFORTIC) 180 MG EC tablet Take 4 tablets (720 mg total) by mouth Two (2) times a day. Adjust dose per medication card. 240 tablet 11    norethindrone-ethinyl estradiol (JUNEL FE 1/20) 1 mg-20 mcg (21)/75 mg (7) per tablet Take 1 tablet by mouth daily.      predniSONE (DELTASONE) 5 MG tablet Take 1 tablet (5 mg total) by mouth daily. 30 tablet 11    sodium bicarbonate 650 mg tablet Take 1 tablet (650 mg total) by mouth Two (2) times a day. 60 tablet 11    sodium bicarbonate, bulk, Powd Take 2 tablets by mouth daily. (Patient not taking: Reported on 11/20/2022)      tacrolimus (ENVARSUS XR) 1 mg Tb24 extended release tablet Take three (1mg ) tablets with one (4mg ) tablet by mouth daily for a total daily dose of 7 mg. 90 tablet 11    tacrolimus (ENVARSUS XR) 4 mg Tb24 extended release tablet Take one (4mg ) tablet with three (1mg ) tablets by mouth daily for a total daily dose of 7 mg. 30 tablet 11 tretinoin (RETIN-A) 0.025 % cream APPLY TO AFFECTED AREA(S) EVERY NIGHT AT BEDTIME AS TOLERATED 45 g 3     No current facility-administered medications for this visit.        Changes to medications: Alisia reports no changes at this time.    Allergies   Allergen Reactions    Bee Pollen Swelling     Sneezing, congestion  denies swelling.  Sneezing, congestion  denies swelling.      Cefepime Hives    Pollen Extracts Swelling       Changes to allergies: No    SPECIALTY MEDICATION ADHERENCE     Envarsus Xr 1 mg: 7 days of medicine on hand   Envarsus Xr 4 mg: 7 days of medicine on hand   Mycophenolate 180 mg: 7 days of medicine on hand   Prednisone 5 mg: 7 days of medicine on hand     Medication Adherence    Specialty Medication: Envarsus Xr 1mg   Patient is on additional specialty medications: Yes  Additional Specialty Medications: Envarsus Xr 4mg   Patient Reported Additional Medication X Missed Doses in the Last Month: 0  Patient is on more than two specialty medications: Yes  Specialty Medication: Prednisone 5mg   Patient Reported Additional Medication X Missed Doses  in the Last Month: 0  Specialty Medication: Myctophenolate 180mg   Patient Reported Additional Medication X Missed Doses in the Last Month: 0          Specialty medication(s) dose(s) confirmed: Regimen is correct and unchanged.     Are there any concerns with adherence? No    Adherence counseling provided? Not needed    CLINICAL MANAGEMENT AND INTERVENTION      Clinical Benefit Assessment:    Do you feel the medicine is effective or helping your condition? Yes    Clinical Benefit counseling provided? Not needed    Adverse Effects Assessment:    Are you experiencing any side effects? No    Are you experiencing difficulty administering your medicine? No    Quality of Life Assessment:    Quality of Life    Rheumatology  Oncology  Dermatology  Cystic Fibrosis          How many days over the past month did your kidney transplant  keep you from your normal activities? For example, brushing your teeth or getting up in the morning. 0    Have you discussed this with your provider? Not needed    Acute Infection Status:    Acute infections noted within Epic:  No active infections  Patient reported infection: None    Therapy Appropriateness:    Is therapy appropriate based on current medication list, adverse reactions, adherence, clinical benefit and progress toward achieving therapeutic goals? Yes, therapy is appropriate and should be continued     DISEASE/MEDICATION-SPECIFIC INFORMATION      N/A    Solid Organ Transplant: Not Applicable    PATIENT SPECIFIC NEEDS     Does the patient have any physical, cognitive, or cultural barriers? No    Is the patient high risk? Yes, patient is taking a REMS drug. Medication is dispensed in compliance with REMS program    Did the patient require a clinical intervention? No    Does the patient require physician intervention or other additional services (i.e., nutrition, smoking cessation, social work)? No    SOCIAL DETERMINANTS OF HEALTH     At the [facility], we have learned that life circumstances - like trouble affording food, housing, utilities, or transportation can affect the health of many of our patients.   That is why we wanted to ask: are you currently experiencing any life circumstances that are negatively impacting your health and/or quality of life? Patient declined to answer    Social Determinants of Health     Food Insecurity: No Food Insecurity (09/29/2021)    Hunger Vital Sign     Worried About Running Out of Food in the Last Year: Never true     Ran Out of Food in the Last Year: Never true   Internet Connectivity: Not on file   Housing/Utilities: Low Risk  (09/29/2021)    Housing/Utilities     Within the past 12 months, have you ever stayed: outside, in a car, in a tent, in an overnight shelter, or temporarily in someone else's home (i.e. couch-surfing)?: No     Are you worried about losing your housing?: No Within the past 12 months, have you been unable to get utilities (heat, electricity) when it was really needed?: No   Tobacco Use: Low Risk  (11/20/2022)    Patient History     Smoking Tobacco Use: Never     Smokeless Tobacco Use: Never     Passive Exposure: Not on file  Transportation Needs: No Transportation Needs (09/29/2021)    PRAPARE - Therapist, art (Medical): No     Lack of Transportation (Non-Medical): No   Alcohol Use: Not At Risk (08/20/2021)    Received from [facility], [facility]    AUDIT-C     Frequency of Alcohol Consumption: Never     Average Number of Drinks: Not on file     Frequency of Binge Drinking: Not on file   Interpersonal Safety: Unknown (12/17/2022)    Interpersonal Safety     Unsafe Where You Currently Live: Not on file     Physically Hurt by Anyone: Not on file     Abused by Anyone: Not on file   Physical Activity: Sufficiently Active (08/20/2021)    Received from [facility], [facility]    Exercise Vital Sign     Days of Exercise per Week: 5 days     Minutes of Exercise per Session: 60 min   Intimate Partner Violence: Unknown (08/27/2022)    Received from [facility]    HITS     Physically Hurt: Not on file     Insult or Talk Down To: Not on file     Threaten Physical Harm: Not on file     Scream or Curse: Not on file   Stress: Patient Declined (08/20/2021)    Received from [facility], [facility] of Occupational Health - Occupational Stress Questionnaire     Feeling of Stress : Patient declined   Substance Use: Low Risk  (01/31/2020)    Substance Use     Taken prescription drugs for non-medical reasons: Never     Taken illegal drugs: Never     Patient indicated they have taken drugs in the past year for non-medical reasons: Yes, [positive answer(s)]: Not on file   Social Connections: Unknown (08/27/2022)    Received from [facility]    Social Network     Social Network: Not on file   Financial Resource Strain: Low Risk (09/29/2021)    Overall Financial Resource Strain (CARDIA)     Difficulty of Paying Living Expenses: Not very hard   Recent Concern: Financial Resource Strain - High Risk (08/20/2021)    Received from [facility], [facility]    Overall Financial Resource Strain (CARDIA)     Difficulty of Paying Living Expenses: Hard   Depression: Not at risk (07/07/2021)    Received from [facility], [facility]    Depression     PHQ-2 Total Score: 0   Health Literacy: Not on file       Would you be willing to receive help with any of the needs that you have identified today? Not applicable       SHIPPING     Specialty Medication(s) to be Shipped:   Transplant: Envarsus 1mg , Envarsus 4mg ,  mycophenolic acid 180mg , and Prednisone 5mg     Other medication(s) to be shipped:  Mg, sod bicarb     Changes to insurance: No    Delivery Scheduled: Yes, Expected medication delivery date: 12/23/22.     Medication will be delivered via UPS to the confirmed prescription address in [facility].    The patient will receive a drug information handout for each medication shipped and additional FDA Medication Guides as required.  Verified that patient has previously received a Conservation officer, historic buildings and a Surveyor, mining.    The patient or caregiver noted above  participated in the development of this care plan and knows that they can request review of or adjustments to the care plan at any time.      All of the patient's questions and concerns have been addressed.    Tera Helper, [facility]   [facility] Specialty Pharmacist

## 2022-12-22 ENCOUNTER — Other Ambulatory Visit: Payer: Self-pay | Admitting: Physical Medicine & Rehabilitation

## 2022-12-22 DIAGNOSIS — G253 Myoclonus: Secondary | ICD-10-CM

## 2022-12-22 MED FILL — SODIUM BICARBONATE 650 MG TABLET: ORAL | 30 days supply | Qty: 60 | Fill #9

## 2022-12-22 MED FILL — MG-PLUS-PROTEIN 133 MG TABLET: ORAL | 25 days supply | Qty: 100 | Fill #8

## 2022-12-22 MED FILL — ENVARSUS XR 4 MG TABLET,EXTENDED RELEASE: ORAL | 30 days supply | Qty: 30 | Fill #10

## 2022-12-22 MED FILL — ENVARSUS XR 1 MG TABLET,EXTENDED RELEASE: ORAL | 30 days supply | Qty: 90 | Fill #11

## 2022-12-22 MED FILL — PREDNISONE 5 MG TABLET: ORAL | 30 days supply | Qty: 30 | Fill #10

## 2022-12-22 MED FILL — MYCOPHENOLATE SODIUM 180 MG TABLET,DELAYED RELEASE: ORAL | 30 days supply | Qty: 240 | Fill #1

## 2023-01-04 DIAGNOSIS — Z940 Kidney transplant status: Principal | ICD-10-CM

## 2023-01-15 ENCOUNTER — Ambulatory Visit: Admit: 2023-01-15 | Discharge: 2023-01-16 | Payer: PRIVATE HEALTH INSURANCE

## 2023-01-15 DIAGNOSIS — Z940 Kidney transplant status: Principal | ICD-10-CM

## 2023-01-19 DIAGNOSIS — Z789 Other specified health status: Principal | ICD-10-CM

## 2023-01-19 NOTE — Unmapped (Signed)
She saw Dr. Norma Fredrickson 09/21/19 with plan to defer any intervention until 1 year had passed from transplant. She has no high output heart failure by echo or symptoms of CHF. We would favor a nonurgent revision to minimize any risk of rupture, but spare the AVF for future use if possible to do so safely. Patient has been advised to contact us with any scabbing or erosion of the skin over the pseudoaneurysm.

## 2023-01-20 DIAGNOSIS — Z940 Kidney transplant status: Principal | ICD-10-CM

## 2023-01-20 DIAGNOSIS — Z79899 Other long term (current) drug therapy: Principal | ICD-10-CM

## 2023-01-20 MED ORDER — TACROLIMUS XR 1 MG TABLET,EXTENDED RELEASE 24 HR
ORAL_TABLET | Freq: Every day | ORAL | 11 refills | 30 days | Status: CP
Start: 2023-01-20 — End: ?
  Filled 2023-01-21: qty 90, 30d supply, fill #0

## 2023-01-20 MED ORDER — PREDNISONE 5 MG TABLET
ORAL_TABLET | Freq: Every day | ORAL | 11 refills | 30 days | Status: CP
Start: 2023-01-20 — End: ?
  Filled 2023-01-21: qty 30, 30d supply, fill #0

## 2023-01-20 NOTE — Unmapped (Signed)
 Pt request for RX Refill

## 2023-01-20 NOTE — Unmapped (Signed)
[facility] Refill Coordination Note    Specialty Medication(s) to be Shipped:   Transplant: Envarsus 1mg , Envarsus 4mg , mycophenolate mofetil 180mg , and Prednisone 5mg     Other medication(s) to be shipped:  mag-ox , sodium bicarb     Carolyn Kelley, DOB: 1990-06-18  Phone: [phone] (home)       All above HIPAA information was verified with patient.     Was a Nurse, learning disability used for this call? No    Completed refill call assessment today to schedule patient's medication shipment from the [facility]  [phone]).  All relevant notes have been reviewed.     Specialty medication(s) and dose(s) confirmed: Regimen is correct and unchanged.   Changes to medications: Keyira reports no changes at this time.  Changes to insurance: No  New side effects reported not previously addressed with a pharmacist or physician: None reported  Questions for the pharmacist: No    Confirmed patient received a Conservation officer, historic buildings and a Surveyor, mining with first shipment. The patient will receive a drug information handout for each medication shipped and additional FDA Medication Guides as required.       DISEASE/MEDICATION-SPECIFIC INFORMATION        N/A    SPECIALTY MEDICATION ADHERENCE     Medication Adherence    Patient reported X missed doses in the last month: 0  Specialty Medication: ENVARSUS XR 1 mg Tb24 extended release tablet (tacrolimus)  Patient is on additional specialty medications: Yes  Additional Specialty Medications: ENVARSUS XR 4 mg Tb24 extended release tablet (tacrolimus)  Patient Reported Additional Medication X Missed Doses in the Last Month: 0  Patient is on more than two specialty medications: Yes  Specialty Medication: mycophenolate 180 MG EC tablet (MYFORTIC)  Patient Reported Additional Medication X Missed Doses in the Last Month: 0              Were doses missed due to medication being on hold? No    Envarsus  4 mg: 7 days of medicine on hand Envarsus 1 mg: 7 days of medicine on hand   mycophenolate 180 mg: 7 days of medicine on hand   prednisone 5 mg: 7 days of medicine on hand       REFERRAL TO PHARMACIST     Referral to the pharmacist: Not needed      SHIPPING     Shipping address confirmed in Epic.       Delivery Scheduled: Yes, Expected medication delivery date: 01/22/23.     Medication will be delivered via UPS to the prescription address in Epic [facility].    [name]   [facility]  Specialty Technician

## 2023-01-21 MED FILL — SODIUM BICARBONATE 650 MG TABLET: ORAL | 30 days supply | Qty: 60 | Fill #10

## 2023-01-21 MED FILL — MYCOPHENOLATE SODIUM 180 MG TABLET,DELAYED RELEASE: ORAL | 30 days supply | Qty: 240 | Fill #2

## 2023-01-21 MED FILL — ENVARSUS XR 4 MG TABLET,EXTENDED RELEASE: ORAL | 30 days supply | Qty: 30 | Fill #11

## 2023-01-21 MED FILL — MG-PLUS-PROTEIN 133 MG TABLET: ORAL | 25 days supply | Qty: 100 | Fill #9

## 2023-02-10 NOTE — Progress Notes (Deleted)
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 Teresa Coombs  HISTORY OF PRESENT ILLNESS: Today 02/10/23    Update 05/29/21 SS: Heather Sims is here today for follow-up. Post kidney transplant in 2020. Last seizure was in 2014 during times of acute illness in the hospital. On Keppra 250 mg twice daily. Using walker, no falls, learning to use cane. Still on Klonopin twice daily, helps with myoclonic jerking in the legs. Has a drivers license driving during the day, avoid night driving, highways. Living with her mom. Dr. Riley Kill did give a trial of Ritalin, hasn't tried it yet. 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  Reportedly her seizures occurred during time of severe illness related to her ESRD, around 2014.  She received a kidney transplant in December 2020.  She is on Keppra 250 mg twice daily.  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 Requests we continue refilling this medication going forward.  She is using a walker, but learning to use a cane.  She wishes to try to come off the Keppra.  She is learning to drive.  She lives with her mom.  Here today for evaluation unaccompanied.  Update 05/24/2019 SS:Heather Sims is a 32 year old female with history of prior cardiac arrest, and anoxia of the brain with subsequent issues with gait instability, and seizures.  She is taking Keppra 250 mg twice daily and is tolerating the medication well.  She has not had recurrent seizure.  She received a kidney transplant in December 2020.  She is back in school, finishing her degree in psychology.  She has seen Dr. Riley Kill for myoclonus in her legs,  is taking Klonopin.  Her rejection medications have possible side effect of tremor.  She has a walker for ambulation. She is learning to drive.  She wonders if she may be able to come off Keppra at some point she presents today for follow-up unaccompanied.  HISTORY 05/23/2018 SS: Heather Sims is a 32 year old female who presents for yearly follow-up with history of seizures.  She is on hemodialysis with end-stage renal disease.  She is currently taking Keppra 250 mg twice daily and is tolerating medication well.  She reports her last seizure was about 5 years ago.  She is currently on the kidney transplant list at [facility]  She does not drive a car but she reports she is learning.  She lives with her mom who brought her to this appointment today.  She denies any new problems or concerns.  She is using a walker today.  She reports she handles her own medications.  She presents today for follow-up.   REVIEW OF SYSTEMS: Out of a complete 14 system review of symptoms, the patient complains only of the following symptoms, and all other reviewed systems are negative.  See HPI  ALLERGIES: Allergies  Allergen Reactions   Pollen Extract     Sneezing, congestion denies swelling.    HOME MEDICATIONS: Outpatient Medications Prior to Visit  Medication Sig Dispense Refill   acetaminophen (TYLENOL) 325 MG tablet Take 2 tablets (650 mg total) by mouth every 4 (four) hours as needed (pain).  Cholecalciferol 125 MCG (5000 UT) capsule Take by mouth.     clonazePAM (KLONOPIN) 0.5 MG tablet Take 1 tablet by mouth twice daily 60 tablet 3   ENVARSUS XR 4 MG TB24 Take 1 tablet by mouth daily.     levETIRAcetam (KEPPRA) 250 MG tablet TAKE 1 TABLET BY MOUTH TWICE DAILY. 180 tablet 3   methylphenidate (RITALIN) 5 MG tablet Take 0.5-1 tablets (2.5-5 mg total) by mouth 2 (two) times daily with breakfast and lunch. 60 tablet 0   mycophenolate (MYFORTIC) 180 MG EC tablet Take 180 mg by mouth 2 (two) times daily.  Take 4 mg twice a day     predniSONE (DELTASONE) 5 MG tablet Take 5 mg by mouth daily.     SODIUM BICARBONATE PO Take 1 tablet by mouth daily.     Specialty Vitamins Products (MAGNESIUM, AMINO ACID CHELATE,) 133 MG tablet Take by mouth.     Tacrolimus ER (ENVARSUS XR) 1 MG TB24 Take 1 mg by mouth every morning. Take 3 tablets every morning     tretinoin (RETIN-A) 0.025 % cream Apply topically at bedtime.     No facility-administered medications prior to visit.    PAST MEDICAL HISTORY: Past Medical History:  Diagnosis Date   Anemia    Brain injury (HCC)    Cardiac arrest (HCC) 03/26/2011   Cardiomyopathy    CHF (congestive heart failure) (HCC)    Chronic kidney disease    [facility] [place]   Depression    Family history of adverse reaction to anesthesia    Mother hard to wake up.   Gait disorder 01/11/2013   Heart failure    History of blood transfusion 11/2011   Hypertension    Ileitis    MPGN (membranoproliferative glomerulonephritis), type 2    Pneumonia    'walking pneumonia'   Renal disorder    Seizures (HCC)    2013- last time    PAST SURGICAL HISTORY: Past Surgical History:  Procedure Laterality Date   AV FISTULA PLACEMENT  12/18/2011   Procedure: ARTERIOVENOUS (AV) FISTULA CREATION;  Surgeon: Chuck Hint, MD;  Location: [facility] OR;  Service: Vascular;  Laterality: Left;   INSERTION OF DIALYSIS CATHETER Right 08/06/2014   Procedure: INSERTION OF DIALYSIS CATHETER;  Surgeon: Sherren Kerns, MD;  Location: [facility] OR;  Service: Vascular;  Laterality: Right;   RENAL BIOPSY     REVISON OF ARTERIOVENOUS FISTULA Left 08/06/2014   Procedure: PLICATION OF ARTERIOVENOUS FISTULA;  Surgeon: Sherren Kerns, MD;  Location: [facility] OR;  Service: Vascular;  Laterality: Left;   REVISON OF ARTERIOVENOUS FISTULA Left 03/12/2017   Procedure: REVISION PLICATION OF ARTERIOVENOUS FISTULA LEFT UPPER ARM;  Surgeon: Nada Libman, MD;  Location: [facility] OR;  Service: Vascular;  Laterality: Left;     FAMILY HISTORY: Family History  Problem Relation Age of Onset   Hypertension Mother    Diabetes Father    Hyperlipidemia Father     SOCIAL HISTORY: Social History   Socioeconomic History   Marital status: Single    Spouse name: Not on file   Number of children: 0   Years of education: college jr   Highest education level: Not on file  Occupational History   Not on file  Tobacco Use   Smoking status: Never   Smokeless tobacco: Never  Vaping Use   Vaping status: Never Used  Substance and Sexual Activity   Alcohol use: No    Alcohol/week: 0.0 standard drinks of alcohol  Drug use: No   Sexual activity: Never    Birth control/protection: Abstinence  Other Topics Concern   Not on file  Social History Narrative   Lives at home with her mother & brother   Right handed      ** Merged History Encounter **       Social Determinants of Health   Financial Resource Strain: Low Risk  (09/29/2021)   Received from [facility], [facility]   Overall Financial Resource Strain (CARDIA)    Difficulty of Paying Living Expenses: Not very hard  Recent Concern: Financial Resource Strain - High Risk (08/20/2021)   Received from [facility], [facility]   Overall Financial Resource Strain (CARDIA)    Difficulty of Paying Living Expenses: Hard  Food Insecurity: No Food Insecurity (09/29/2021)   [facility], [facility]   Hunger Vital Sign    Worried About Running Out of Food in the Last Year: Never true    Ran Out of Food in the Last Year: Never true  Transportation Needs: No Transportation Needs (09/29/2021)   Received from [facility], [facility]   [facility] - Transportation    Lack of Transportation (Medical): No    Lack of Transportation (Non-Medical): No  Physical Activity: Sufficiently Active (08/20/2021)   Received from [facility], [facility]   Exercise Vital Sign    Days of Exercise per Week: 5 days    Minutes of Exercise per  Session: 60 min  Stress: Patient Declined (08/20/2021)   Received from [facility], [facility] - Occupational Stress Questionnaire    Feeling of Stress : Patient declined  Social Connections: Unknown (08/27/2022)   Received from [facility]   Social Network    Social Network: Not on file  Intimate Partner Violence: Unknown (08/27/2022)   Received from [facility]   HITS    Physically Hurt: Not on file    Insult or Talk Down To: Not on file    Threaten Physical Harm: Not on file    Scream or Curse: Not on file   PHYSICAL EXAM  There were no vitals filed for this visit.   There is no height or weight on file to calculate BMI.  Generalized: Well developed, in no acute distress  Neurological examination  Mentation: Alert oriented to time, place, history taking. Follows all commands speech and language fluent Cranial nerve II-XII: Pupils were equal round reactive to light. Extraocular movements were full, visual field were full on confrontational test. Facial sensation and strength were normal. Head turning and shoulder shrug were normal and symmetric. Motor: Good strength of all extremities, mild increased spasticity in lower extremities. Sensory: Sensory testing is intact to soft touch on all 4 extremities. No evidence of extinction is noted.  Coordination: Cerebellar testing reveals good finger-nose-finger and heel-to-shin bilaterally. No tremor noted. Gait and station: Gait is wide-based, steady with walker, lower extremities are somewhat spastic Reflexes: Deep tendon reflexes are symmetric but increased   DIAGNOSTIC DATA (LABS, IMAGING, TESTING) - I reviewed patient records, labs, notes, testing and imaging myself where available.  Lab Results  Component Value Date   WBC 6.6 12/18/2011   HGB 13.3 03/12/2017   HCT 39.0 03/12/2017   MCV 90.9 12/18/2011   PLT 244 12/18/2011      Component Value Date/Time   NA 137 03/12/2017  1024   K 4.8 03/12/2017 1024   CL 99 12/18/2011  0505   CO2 27 12/18/2011 0505   GLUCOSE 80 03/12/2017 1024   BUN 17 12/18/2011 0505   CREATININE 2.30 (H) 12/18/2011 0505   CALCIUM 9.4 12/18/2011 0505   CALCIUM 8.4 10/28/2011 1327   PROT 6.1 11/29/2011 0224   ALBUMIN 2.4 (L) 12/18/2011 0505   AST 56 (H) 11/29/2011 0224   ALT 6 12/01/2011 2330   ALKPHOS 62 11/29/2011 0224   BILITOT 0.4 11/29/2011 0224   GFRNONAA 29 (L) 12/18/2011 0505   GFRAA 34 (L) 12/18/2011 0505   Lab Results  Component Value Date   CHOL 296 (H) 03/26/2011   HDL 54 03/26/2011   LDLCALC 213 (H) 03/26/2011   TRIG 146 03/26/2011   CHOLHDL 5.5 03/26/2011   No results found for: "HGBA1C" Lab Results  Component Value Date   VITAMINB12 1,354 (H) 11/28/2011   Lab Results  Component Value Date   TSH 7.226 (H) 09/12/2011   ASSESSMENT AND PLAN 32 y.o. year old female  has a past medical history of Anemia, Brain injury (HCC), Cardiac arrest (HCC) (03/26/2011), Cardiomyopathy, CHF (congestive heart failure) (HCC), Chronic kidney disease, Depression, Family history of adverse reaction to anesthesia, Gait disorder (01/11/2013), Heart failure, History of blood transfusion (11/2011), Hypertension, Ileitis, MPGN (membranoproliferative glomerulonephritis), type 2, Pneumonia, Renal disorder, and Seizures (HCC). here with:  1.  Seizures 2.  Anoxic Brain Injury 3.  Myoclonus  -Plan to taper off Keppra, currently taking 250 mg twice daily, cut back to 250 mg daily x 2 weeks then stop, but have recommended she refrain from driving for 6 months once stopping  -EEG was normal in April 2022  -Her last seizure occurred in 2013 or 2014, during a time of severe illness and cardiac arrest with anoxic brain injury related to ESRD, kidney transplant in 2020  -On Klonopin 0.5 mg twice daily for myoclonus of the legs, started by Dr. Riley Kill, we discussed moving this prescription to PCP, can return here PRN once tapered off Keppra    -Follow-up 1 year or sooner if needed  [name], Edrick Oh, DNP 02/10/2023, 9:07 PM [facility] [address], [address] [place], [place] [address] [phone]

## 2023-02-11 ENCOUNTER — Encounter: Payer: Self-pay | Admitting: Neurology

## 2023-02-11 ENCOUNTER — Ambulatory Visit: Payer: Medicaid Other | Admitting: Neurology

## 2023-02-25 DIAGNOSIS — Z940 Kidney transplant status: Principal | ICD-10-CM

## 2023-02-25 NOTE — Unmapped (Signed)
[facility] orders.

## 2023-03-02 DIAGNOSIS — Z940 Kidney transplant status: Principal | ICD-10-CM

## 2023-03-02 NOTE — Unmapped (Signed)
 This patient has kidney allograft and is at risk for rejection through immune system recognition of non-self-tissue. AlloSure is a noninvasive test ordered to evaluate for the probability of rejection after pretest and assist in immunosuppression management. Studies have shown that this test can help rule out rejection without subjecting the patient to the bleeding, discomfort, and structural damage risks of invasive renal biopsies. The AlloSure test will guide clinical decision making for this patient's surveillance schedule and immunosuppression adjustments.

## 2023-03-10 DIAGNOSIS — Z940 Kidney transplant status: Principal | ICD-10-CM

## 2023-03-10 MED ORDER — MG-PLUS-PROTEIN 133 MG TABLET
ORAL_TABLET | Freq: Two times a day (BID) | ORAL | 11 refills | 30.00 days | Status: CP
Start: 2023-03-10 — End: 2024-03-09
  Filled 2023-03-11: qty 100, 25d supply, fill #0

## 2023-03-10 MED ORDER — TACROLIMUS XR 4 MG TABLET,EXTENDED RELEASE 24 HR
ORAL_TABLET | Freq: Every day | ORAL | 11 refills | 30.00 days | Status: CP
Start: 2023-03-10 — End: ?
  Filled 2023-03-11: qty 30, 30d supply, fill #0

## 2023-03-10 NOTE — Unmapped (Signed)
[facility] Refill Coordination Note    Specialty Medication(s) to be Shipped:   Transplant: mycophenolate mofetil 180 mg    Other medication(s) to be shipped: No additional medications requested for fill at this time     Carolyn Kelley, DOB: December 07, 1990  Phone: [phone] (home)       All above HIPAA information was verified with patient.     Was a Nurse, learning disability used for this call? No    Completed refill call assessment today to schedule patient's medication shipment from the [facility] and Home Delivery Pharmacy  [phone]).  All relevant notes have been reviewed.     Specialty medication(s) and dose(s) confirmed: Regimen is correct and unchanged.   Changes to medications: Shakeira reports no changes at this time.  Changes to insurance: No  New side effects reported not previously addressed with a pharmacist or physician: None reported  Questions for the pharmacist: No    Confirmed patient received a Conservation officer, historic buildings and a Surveyor, mining with first shipment. The patient will receive a drug information handout for each medication shipped and additional FDA Medication Guides as required.       DISEASE/MEDICATION-SPECIFIC INFORMATION        N/A    SPECIALTY MEDICATION ADHERENCE     Medication Adherence    Patient reported X missed doses in the last month: 0  Specialty Medication: predniSONE 5 MG tablet (DELTASONE)  Patient is on additional specialty medications: Yes  Additional Specialty Medications: ENVARSUS XR 1 mg Tb24 extended release tablet (tacrolimus)  Patient Reported Additional Medication X Missed Doses in the Last Month: 0  Patient is on more than two specialty medications: Yes  Specialty Medication: ENVARSUS XR 4 mg Tb24 extended release tablet (tacrolimus)  Patient Reported Additional Medication X Missed Doses in the Last Month: 0  Any gaps in refill history greater than 2 weeks in the last 3 months: no  Demonstrates understanding of importance of adherence: yes              Were doses missed due to medication being on hold? No      mycophenolate 180 MG EC tablet (MYFORTIC): 3 days of medicine on hand       REFERRAL TO PHARMACIST     Referral to the pharmacist: Not needed      [facility]     Shipping address confirmed in Epic.       Delivery Scheduled: Yes, Expected medication delivery date: 03/12/2023. Add to order already set    Medication will be delivered via UPS to the prescription address in Epic WAM.    [facility]  Specialty Technician

## 2023-03-10 NOTE — Unmapped (Signed)
[facility] and Home Delivery Pharmacy Refill Coordination Note    Specialty Medication(s) to be Shipped:   Transplant: Envarsus XR 1mg , Envarsus XR 4mg , and Prednisone 5mg     Other medication(s) to be shipped: magnesium (amino acid chelate) 133 mg tablet (magnesium oxide-Mg AA chelate)     Carolyn Kelley, DOB: Jan 17, 1991  Phone: [phone] (home)       All above HIPAA information was verified with patient.     Was a Nurse, learning disability used for this call? No    Completed refill call assessment today to schedule patient's medication shipment from the [facility] and Home Delivery Pharmacy  [phone]).  All relevant notes have been reviewed.     Specialty medication(s) and dose(s) confirmed: Regimen is correct and unchanged.   Changes to medications: Hanae reports no changes at this time.  Changes to insurance: No  New side effects reported not previously addressed with a pharmacist or physician: None reported  Questions for the pharmacist: No    Confirmed patient received a Conservation officer, historic buildings and a Surveyor, mining with first shipment. The patient will receive a drug information handout for each medication shipped and additional FDA Medication Guides as required.       DISEASE/MEDICATION-SPECIFIC INFORMATION        N/A    SPECIALTY MEDICATION ADHERENCE     Medication Adherence    Patient reported X missed doses in the last month: 0  Specialty Medication: predniSONE 5 MG tablet (DELTASONE)  Patient is on additional specialty medications: Yes  Additional Specialty Medications: ENVARSUS XR 1 mg Tb24 extended release tablet (tacrolimus)  Patient Reported Additional Medication X Missed Doses in the Last Month: 0  Patient is on more than two specialty medications: Yes  Specialty Medication: ENVARSUS XR 4 mg Tb24 extended release tablet (tacrolimus)  Patient Reported Additional Medication X Missed Doses in the Last Month: 0  Any gaps in refill history greater than 2 weeks in the last 3 months: no  Demonstrates understanding of importance of adherence: yes              Were doses missed due to medication being on hold? No    ENVARSUS XR 1  mg: 4 days of medicine on hand   ENVARSUS XR 4  mg: 4 days of medicine on hand   predniSONE 5 mg: 4 days of medicine on hand       REFERRAL TO PHARMACIST     Referral to the pharmacist: Not needed      SHIPPING     Shipping address confirmed in Epic.       Delivery Scheduled: Yes, Expected medication delivery date: 03/12/23.  However, Rx request for refills was sent to the provider as there are none remaining.     Medication will be delivered via UPS to the prescription address in Epic WAM.    Moshe Salisbury   [facility]  Specialty Technician

## 2023-03-10 NOTE — Unmapped (Signed)
 Pt request for RX Refill

## 2023-03-11 MED FILL — ENVARSUS XR 1 MG TABLET,EXTENDED RELEASE: ORAL | 30 days supply | Qty: 90 | Fill #1

## 2023-03-11 MED FILL — MYCOPHENOLATE SODIUM 180 MG TABLET,DELAYED RELEASE: ORAL | 30 days supply | Qty: 240 | Fill #3

## 2023-03-16 NOTE — Unmapped (Signed)
 Update trf

## 2023-03-22 DIAGNOSIS — Z940 Kidney transplant status: Principal | ICD-10-CM

## 2023-03-26 ENCOUNTER — Ambulatory Visit: Admit: 2023-03-26 | Discharge: 2023-03-27 | Payer: PRIVATE HEALTH INSURANCE

## 2023-03-26 ENCOUNTER — Ambulatory Visit
Admit: 2023-03-26 | Discharge: 2023-03-27 | Payer: PRIVATE HEALTH INSURANCE | Attending: Nephrology | Primary: Nephrology

## 2023-03-26 DIAGNOSIS — Z940 Kidney transplant status: Principal | ICD-10-CM

## 2023-03-26 DIAGNOSIS — Z79899 Other long term (current) drug therapy: Principal | ICD-10-CM

## 2023-03-26 LAB — BASIC METABOLIC PANEL
ANION GAP: 11 mmol/L (ref 5–14)
BLOOD UREA NITROGEN: 31 mg/dL — ABNORMAL HIGH (ref 9–23)
BUN / CREAT RATIO: 19
CALCIUM: 10.5 mg/dL — ABNORMAL HIGH (ref 8.7–10.4)
CHLORIDE: 103 mmol/L (ref 98–107)
CO2: 24.4 mmol/L (ref 20.0–31.0)
CREATININE: 1.64 mg/dL — ABNORMAL HIGH (ref 0.55–1.02)
EGFR CKD-EPI (2021) FEMALE: 42 mL/min/{1.73_m2} — ABNORMAL LOW (ref >=60)
GLUCOSE RANDOM: 94 mg/dL (ref 70–99)
POTASSIUM: 4.9 mmol/L — ABNORMAL HIGH (ref 3.4–4.8)
SODIUM: 138 mmol/L (ref 135–145)

## 2023-03-26 LAB — CBC W/ AUTO DIFF
BASOPHILS ABSOLUTE COUNT: 0 10*9/L (ref 0.0–0.1)
BASOPHILS RELATIVE PERCENT: 0.9 %
EOSINOPHILS ABSOLUTE COUNT: 0.1 10*9/L (ref 0.0–0.5)
EOSINOPHILS RELATIVE PERCENT: 2.2 %
HEMATOCRIT: 32.9 % — ABNORMAL LOW (ref 34.0–44.0)
HEMOGLOBIN: 11 g/dL — ABNORMAL LOW (ref 11.3–14.9)
LYMPHOCYTES ABSOLUTE COUNT: 1.2 10*9/L (ref 1.1–3.6)
LYMPHOCYTES RELATIVE PERCENT: 35.3 %
MEAN CORPUSCULAR HEMOGLOBIN CONC: 33.5 g/dL (ref 32.0–36.0)
MEAN CORPUSCULAR HEMOGLOBIN: 29.9 pg (ref 25.9–32.4)
MEAN CORPUSCULAR VOLUME: 89.2 fL (ref 77.6–95.7)
MEAN PLATELET VOLUME: 7.6 fL (ref 6.8–10.7)
MONOCYTES ABSOLUTE COUNT: 0.4 10*9/L (ref 0.3–0.8)
MONOCYTES RELATIVE PERCENT: 11.2 %
NEUTROPHILS ABSOLUTE COUNT: 1.7 10*9/L — ABNORMAL LOW (ref 1.8–7.8)
NEUTROPHILS RELATIVE PERCENT: 50.4 %
PLATELET COUNT: 205 10*9/L (ref 150–450)
RED BLOOD CELL COUNT: 3.69 10*12/L — ABNORMAL LOW (ref 3.95–5.13)
RED CELL DISTRIBUTION WIDTH: 14.2 % (ref 12.2–15.2)
WBC ADJUSTED: 3.3 10*9/L — ABNORMAL LOW (ref 3.6–11.2)

## 2023-03-26 LAB — TACROLIMUS LEVEL, TROUGH: TACROLIMUS, TROUGH: 10.4 ng/mL (ref 5.0–15.0)

## 2023-03-26 LAB — SLIDE REVIEW

## 2023-03-26 LAB — URINALYSIS WITH MICROSCOPY
BILIRUBIN UA: NEGATIVE
GLUCOSE UA: NEGATIVE
HYALINE CASTS: 1 /LPF (ref 0–1)
KETONES UA: NEGATIVE
LEUKOCYTE ESTERASE UA: NEGATIVE
NITRITE UA: NEGATIVE
PH UA: 7 (ref 5.0–9.0)
RBC UA: 30 /HPF — ABNORMAL HIGH (ref <=4)
SPECIFIC GRAVITY UA: 1.019 (ref 1.003–1.030)
SQUAMOUS EPITHELIAL: 17 /HPF — ABNORMAL HIGH (ref 0–5)
UROBILINOGEN UA: <2
WBC UA: 1 /HPF (ref 0–5)

## 2023-03-26 LAB — PROTEIN / CREATININE RATIO, URINE
CREATININE, URINE: 155 mg/dL
PROTEIN URINE: 33.6 mg/dL
PROTEIN/CREAT RATIO, URINE: 0.217

## 2023-03-26 LAB — MAGNESIUM: MAGNESIUM: 2 mg/dL (ref 1.6–2.6)

## 2023-03-26 LAB — PHOSPHORUS: PHOSPHORUS: 3.9 mg/dL (ref 2.4–5.1)

## 2023-03-26 LAB — ALBUMIN / CREATININE URINE RATIO
ALBUMIN QUANT URINE: 10.1 mg/dL
ALBUMIN/CREATININE RATIO: 65.2 ug/mg — ABNORMAL HIGH (ref 0.0–30.0)
CREATININE, URINE: 154.8 mg/dL

## 2023-03-26 MED ORDER — TACROLIMUS XR 1 MG TABLET,EXTENDED RELEASE 24 HR
ORAL_TABLET | Freq: Every day | ORAL | 11 refills | 30.00 days | Status: CP
Start: 2023-03-26 — End: 2023-03-26
  Filled 2023-04-08: qty 30, 30d supply, fill #1

## 2023-03-26 NOTE — Unmapped (Signed)
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 She is seen in follow up of her kidney transplant and immunosuppression management.     S/P deceased donor kidney transplant with C3 glomeruloneprhritis recurrence   - Serum creatinine is above baseline at 1.64 mg/dL on a higher than prescribed dose of Envarsus (baseline Cr 0.9-1.4 mg/dL).    - Kidney biopsy 12/29/19 confirmed presence of C3 glomerulopathy recurrence.   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   - Genetics panel was negative for causative variants 08/30/20.     - Treatment of C3G included an increase in the dose of Myfortic to 720 mg bid and initiation of prednisone 5 mg daily  - UPC improved with changes in chronic immunosuppression. Today's UPC is 0.217 and UACR 65.2.    - UA with 30 RBCs, 17 squamous epithelial cells.  - Rituximab was deferred at the time of recurrence diagnosis due to an active COVID-19 community spike and concern about infection risk. With improvement in proteinuria on current immunosuppression we have not reconsidered rituximab.  - She has been prepared for use of Eculizumab in case current therapy or rituximab proves ineffective in the future. This included completion of pre-eculizumab vaccination     - C3 was normal 11/20/22  - DSA screens have been historically negative for DSAs (most recently 11/20/22)  - BK viral load undetectable 03/26/23  - Will plan to recheck labs after patient reduced Envarsus dose back to 7 mg daily    Immunosuppression Management [High Risk Medical Decision Making For Drug Therapy Requiring Intensive Monitoring For Toxicity]:   - Tacrolimus level is 10.4 (target trough is 6-10 ng/mL).   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 Completed a 10 day course of Cipro  - Denies current UTI symptoms.   - UA today does not appear to be a clean catch  - Will await urine culture results    Immunizations  - COVID-19 vaccination: last booster 01/22/23.   - Received Men B on 01/26/20,03/01/20, 03/21/20, 11/07/21  - Received MenACWY 01/26/20, 04/29/11  - Men C 04/29/11  - Booster doses of meningococcal vaccine every 1-5 years per CDC recommendations if chronic eculizumab therapy is initiated.  - Flu vaccine 01/22/23    Seizures Disorder, no seizures since 2014:    - EEG with no evidence of seizures on 06/25/20 .   - Keppra stopped > 1 year ago with no recurrence of seizures  - Follow up with [name],  NP at [facility].       Myoclonus:   - Patient linked worsening myoclonus with prednisone and is now taking prednisone only 3 days per week.  - Will continue prn clonazepam  - Continue [facility] physical therapy.      Metabolic Acidosis  - CO2 is 24.4  - Continue sodium bicarbonate 650 mg bid      Pseudoaneurysm of AVF  She saw Dr. Norma Fredrickson 09/21/19 with plan to defer any intervention until 1 year had passed from transplant. She has no high output heart failure by echo or symptoms of CHF. We would favor a nonurgent revision to minimize any risk of rupture, but spare the AVF for future use if possible to do so safely. Patient has been advised to contact us with any scabbing or erosion of the skin over the pseudoaneurysm.    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    Pap Smear: 11/11/20 was negative including negative HPV. She is advised today to follow up with GYN   Renal US 01/15/23 with no masses in native kidneys    Follow up  Transplant clinic in 4 months    History of Present Illness  Carolyn Kelley is a 33 y.o. 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 Her post-operative course was complicated by hypotension and hypoxia thought to be caused by an anaphylactic reaction to Campath. Due to presence of proteinuria she underwent kidney biopsy on 12/29/19 revealing C3 glomerulopathy recurrence. In response to this diagnosis Myfortic was increased from 540 mg bid to 720 mg bid and she was started on prednisone 5 mg daily while tacrolimus was continued. Creatinine has subsequently been stable and UPC  has remained < 0.5. She has experienced no episodes of rejection or infectious complications.     Her primary complaint today is that she is continuing to have headaches approximately 3 days per week without tremors. Headaches are typically responsive to acetaminophen. She denies seizure activity. Myoclonus is unchanged and treated with Klonopin.     She denies dysuria, chest pain, shortness of breath, edema, nausea, vomiting, diarrhea, or constipation.  The pseudoaneurysm site is unchanged.     She ran out of 1 mg tablets of Envarsus and has been therefore taking two 4 mg tablets rather than the prescribed 7 mg daily. She has not been checking home BP. She fell yesterday with no injury.    Immunosuppression prescribed is Envarsus 7 mg daily (actually taking 8 mg), Myfortic 720 mg bid, and prednisone 5 mg 3 days per week.     Last dose of Envarsus: 11 AM yesterday    Transplant History:  Date of Transplant: 03/10/19  Organ Received: Left DDKT, DBD, SCD, KDPI 2%; cold ischemia 18 hrs.  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    Medications  Current Outpatient Medications   Medication Sig Dispense Refill    acetaminophen (TYLENOL) 500 MG tablet Take 1-2 tablets (500-1,000 mg total) by mouth every six (6) hours as needed for pain or fever (> 38C). 100 tablet 0    clonazePAM (KLONOPIN) 0.5 MG tablet Take 1 tablet (0.5 mg total) by mouth two (2) times a day. 60 tablet 3    levETIRAcetam (KEPPRA) 250 MG tablet TAKE 1 TABLET BY MOUTH TWICE DAILY. 180 tablet 3    magnesium oxide-Mg AA chelate (MAGNESIUM, AMINO ACID CHELATE,) 133 mg Take 2 tablets by mouth Two (2) times a day. 120 tablet 11    mycophenolate (MYFORTIC) 180 MG EC tablet Take 4 tablets (720 mg total) by mouth Two (2) times a day. Adjust dose per medication card. 240 tablet 11    norethindrone-ethinyl estradiol (JUNEL FE 1/20) 1 mg-20 mcg (21)/75 mg (7) per tablet Take 1 tablet by mouth daily.      predniSONE (DELTASONE) 5 MG tablet Take 1 tablet (5 mg total) by mouth daily. 30 tablet 11    sodium bicarbonate, bulk, Powd Take 2 tablets by mouth daily.      tacrolimus (ENVARSUS XR) 1 mg Tb24 extended release tablet Take three (1mg ) tablets with one (4mg ) tablet by mouth daily for a total daily dose of 7 mg. 90 tablet 11    tacrolimus (ENVARSUS XR) 4 mg Tb24 extended release tablet Take one (4mg ) tablet with three (1mg ) tablets by mouth daily for a total daily dose of 7 mg. 30 tablet 11    tretinoin (RETIN-A) 0.025 % cream APPLY TO AFFECTED AREA(S) EVERY NIGHT AT BEDTIME AS TOLERATED 45 g 3    miSOPROStoL (CYTOTEC) 200 MCG tablet INSERT 1 TABLET VAGINALLY NIGHT PRIOR TO PROCEDURE       No current facility-administered medications for this visit.       Physical Exam   BP 119/91 (BP Site: R Arm, BP Position: Sitting, BP Cuff Size: Medium)  - Pulse 99  - Temp 36.2 ??C (97.2 ??F) (Temporal)  - Ht 157.5 cm (5' 2)  - Wt 78.5 kg (173 lb)  - BMI 31.64 kg/m??    General: Patient is a pleasant female appears drowsy seated in wheelchair  Eyes: Sclera anicteric.  Lungs: Clear to auscultation bilaterally, no wheezes/rales/rhonchi.  Cardiovascular: Regular rate and rhythm without murmurs, rubs or gallops.  Abdomen: soft, non-tender  Extremities: Without edema, joints without evidence of synovitis; pseudoaneurysm of AVF LUE with no scabs or further thinning of skin over site.  Skin: Without rash  Neurological: uses a walker for ambulation due to myoclonus.       Laboratory Results and imaging Reviewed in EMR

## 2023-03-26 NOTE — Unmapped (Signed)
Met w/ patient in [facility] today. Reviewed meds/symptoms. Any new medications?                 Fever/cold/flu symptoms denies  BP: 119/91  HR 99 today/ Home BP reported not checking - reports she just drank coffee  BG: not checking  Headache/Dizziness/Lightheaded: reports daily headaches - takes tylenol  Hand tremors: denies  Numbness/tingling: denies  Fevers/chills/sweats: denies  CP/SOB/palpatations: denies  Nausea/vomiting/heartburn: denies  Diarrhea/constipation: denies  UTI symptoms (burn/pain/itch/frequency/urgency/odor/color/foam): denies  No visible or palpable edema     Appetite good; reports adequate hydration. 24-48 oz/bottles/fluid per day.     Pt reports being well rested and getting adequate exercise.     Continues to follow Covid/health safety precautions by taking care to mask, perform frequent hand hygeine and minimal public activity. Offered support and guidance for this process given their immune-suppressed state. We discussed reduced covid vaccine coverage for transplant patients and importance of continuing to mask and practice safe distancing. Commented that booster vaccines will likely be advised as an ongoing process.        Last tacrolimus taken 1200 yesterday ; held for this morning's labs. Current dose 8 mg  ( reports she ran out of 1 mg tablet so has been taking 8 mg daily for last 2 days)  Myfortic 720 mg bid, Pred 5 mg 3 X weekly     Other complaints or concerns: still needs to schedule her fistula ligation - has transport issues    Referrals needed: n/a     Pt Follow up: pt will look for envarsus 1 mg tablets when she gets home - shipped on 12/19 for 90 tablets - not due for refill until 1/9  Sent script to local [facility] to try to fill.  Will adjust dose pending tac level today         Immunization status: declined in clinic

## 2023-03-27 LAB — BK VIRUS QUANTITATIVE PCR, BLOOD: BK BLOOD RESULT: NOT DETECTED

## 2023-03-27 LAB — CMV DNA, QUANTITATIVE, PCR: CMV VIRAL LD: NOT DETECTED

## 2023-04-08 MED FILL — MYCOPHENOLATE SODIUM 180 MG TABLET,DELAYED RELEASE: ORAL | 30 days supply | Qty: 240 | Fill #4

## 2023-04-08 NOTE — Unmapped (Signed)
[facility] Refill Coordination Note    Specialty Medication(s) to be Shipped:   Transplant: Myfortic 180 mg    Other medication(s) to be shipped: No additional medications requested for fill at this time     Carolyn Kelley, DOB: Jul 08, 1990  Phone: [phone] (home)       All above HIPAA information was verified with patient.     Was a Nurse, learning disability used for this call? No    Completed refill call assessment today to schedule patient's medication shipment from the [facility]  [phone]).  All relevant notes have been reviewed.     Specialty medication(s) and dose(s) confirmed: Regimen is correct and unchanged.   Changes to medications: [name] reports no changes at this time.  Changes to insurance: No  New side effects reported not previously addressed with a pharmacist or physician: None reported  Questions for the pharmacist: No    Confirmed patient received a Conservation officer, historic buildings and a Surveyor, mining with first shipment. The patient will receive a drug information handout for each medication shipped and additional FDA Medication Guides as required.       DISEASE/MEDICATION-SPECIFIC INFORMATION        N/A    SPECIALTY MEDICATION ADHERENCE     Medication Adherence    Specialty Medication: mycophenolate 180 MG EC tablet  Patient is on additional specialty medications: No  Patient is on more than two specialty medications: Yes  Specialty Medication: Envarsus XR 4 mg Tb24  Patient Reported Additional Medication X Missed Doses in the Last Month: 0  Specialty Medication: Envarsus XR 1 mg Tb24  Patient Reported Additional Medication X Missed Doses in the Last Month: 0              Were doses missed due to medication being on hold? No    mycophenolate 180 MG EC tablet: 5 days of medicine on hand   Envarsus XR 4 mg Tb24 : 5 days of medicine on hand   Envarsus XR 1 mg Tb24  : 5 days of medicine on hand       REFERRAL TO PHARMACIST     Referral to the pharmacist: Not needed      [facility]     Shipping address confirmed in Epic.       Delivery Scheduled: Yes, Expected medication delivery date: 04/09/23. Request txr of Envarsus XR 1 mg script    Medication will be delivered via UPS to the prescription address in Epic WAM.    Tobi Bastos, PharmD   [facility]  Specialty Pharmacist

## 2023-04-13 LAB — ALLOSURE KIDNEY: ALLOSURE: 0.13 %

## 2023-04-15 ENCOUNTER — Other Ambulatory Visit: Payer: Self-pay | Admitting: Physical Medicine & Rehabilitation

## 2023-04-15 DIAGNOSIS — G253 Myoclonus: Secondary | ICD-10-CM

## 2023-04-19 DIAGNOSIS — Z940 Kidney transplant status: Principal | ICD-10-CM

## 2023-04-23 LAB — CBC W/ DIFFERENTIAL
BANDED NEUTROPHILS ABSOLUTE COUNT: 0.1 10*3/uL (ref 0.0–0.1)
BASOPHILS ABSOLUTE COUNT: 0 10*3/uL (ref 0.0–0.2)
BASOPHILS RELATIVE PERCENT: 1 %
EOSINOPHILS ABSOLUTE COUNT: 0.1 10*3/uL (ref 0.0–0.4)
EOSINOPHILS RELATIVE PERCENT: 3 %
HEMATOCRIT: 31.8 % — ABNORMAL LOW (ref 34.0–46.6)
HEMOGLOBIN: 10.4 g/dL — ABNORMAL LOW (ref 11.1–15.9)
IMMATURE GRANULOCYTES: 2 %
LYMPHOCYTES ABSOLUTE COUNT: 1.1 10*3/uL (ref 0.7–3.1)
LYMPHOCYTES RELATIVE PERCENT: 35 %
MEAN CORPUSCULAR HEMOGLOBIN CONC: 32.7 g/dL (ref 31.5–35.7)
MEAN CORPUSCULAR HEMOGLOBIN: 29.2 pg (ref 26.6–33.0)
MEAN CORPUSCULAR VOLUME: 89 fL (ref 79–97)
MONOCYTES ABSOLUTE COUNT: 0.4 10*3/uL (ref 0.1–0.9)
MONOCYTES RELATIVE PERCENT: 12 %
NEUTROPHILS ABSOLUTE COUNT: 1.5 10*3/uL (ref 1.4–7.0)
NEUTROPHILS RELATIVE PERCENT: 47 %
PLATELET COUNT: 228 10*3/uL (ref 150–450)
RED BLOOD CELL COUNT: 3.56 x10E6/uL — ABNORMAL LOW (ref 3.77–5.28)
RED CELL DISTRIBUTION WIDTH: 13.1 % (ref 11.7–15.4)
WHITE BLOOD CELL COUNT: 3.1 10*3/uL — ABNORMAL LOW (ref 3.4–10.8)

## 2023-04-23 LAB — BASIC METABOLIC PANEL
BLOOD UREA NITROGEN: 23 mg/dL — ABNORMAL HIGH (ref 6–20)
BUN / CREAT RATIO: 17 (ref 9–23)
CALCIUM: 9.6 mg/dL (ref 8.7–10.2)
CHLORIDE: 107 mmol/L — ABNORMAL HIGH (ref 96–106)
CO2: 20 mmol/L (ref 20–29)
CREATININE: 1.33 mg/dL — ABNORMAL HIGH (ref 0.57–1.00)
EGFR: 54 mL/min/{1.73_m2} — ABNORMAL LOW
GLUCOSE: 90 mg/dL (ref 70–99)
POTASSIUM: 5 mmol/L (ref 3.5–5.2)
SODIUM: 139 mmol/L (ref 134–144)

## 2023-04-24 LAB — PHOSPHORUS: PHOSPHORUS, SERUM: 3.6 mg/dL (ref 3.0–4.3)

## 2023-04-24 LAB — MAGNESIUM: MAGNESIUM: 1.4 mg/dL — ABNORMAL LOW (ref 1.6–2.3)

## 2023-04-26 DIAGNOSIS — Z940 Kidney transplant status: Principal | ICD-10-CM

## 2023-04-26 LAB — TACROLIMUS LEVEL: TACROLIMUS BLOOD: 10.1 ng/mL (ref 2.0–20.0)

## 2023-04-26 NOTE — Unmapped (Signed)
Pt called on call nurse from the local urgent care as she tested positive for the influenza A. I talked to the provider as she wanted to know what was ok to give her. She has good kidney function so I told her full dose of tamiflu 75mg  bid would be ok to give. She will also give her some cough syrup and inhaler as well and I told her that was fine as well. I told her if she felt like she needed steroids she could give them to her. I told them she can have tylenol but no ibuprofen. The pt appreciated the call and let us know if not feeling somewhat better in a few days.

## 2023-04-26 NOTE — Unmapped (Signed)
Pt paged with a fever since yesterday as high as 102. She has only been taking tylenol. She also has body aches, sore throat and congestion. I advised her that she may have the flu. I told her she could use and over the counter test or go to urgent care and let us know the results as the treatment would be different. I told her especially if you do not have the flu we need to see where your fever is coming from.

## 2023-05-13 DIAGNOSIS — Z940 Kidney transplant status: Principal | ICD-10-CM

## 2023-05-13 MED ORDER — MYCOPHENOLATE SODIUM 180 MG TABLET,DELAYED RELEASE
ORAL_TABLET | Freq: Two times a day (BID) | ORAL | 11 refills | 30.00 days | Status: CP
Start: 2023-05-13 — End: 2024-05-12

## 2023-05-13 NOTE — Unmapped (Signed)
 Called on call coordinator to report that patient has a fever and that she is being taken to urgent care. At home covid test was negative. But reports that she had a viral illness last week and is still not well. Agreed that she needed to be seen to ensure that it is not related to kidneys. Denied any additional questions.

## 2023-05-13 NOTE — Unmapped (Signed)
 Patient paged on call T[facility] to report she went  [facility] this am and was negative for flu and COVID and diagnosed with viral illness.  Pt advised on safe OTC medications to take for both cough, congestion and fevers and advised to increase hydration.  Pt also reports she is our of her myfortic and [place] [facility] unable to ship til Monday.  Script sent to local [facility] and pt advised she needs to call to see if they have the medication.  If not, she will call her primary TNC to follow up.

## 2023-05-17 DIAGNOSIS — Z940 Kidney transplant status: Principal | ICD-10-CM

## 2023-05-27 NOTE — Unmapped (Signed)
 The [facility] has made a second and final attempt to reach this patient to refill the following medication:Envarsus 4 mg.      We have left voicemails on the following phone numbers: [phone] and [phone], have sent a text message to the following phone numbers: [phone], and have sent a Mychart questionnaire..    Dates contacted: 05/03/2023 and 05/27/2023  Last scheduled delivery:  04/08/2023    The patient may be at risk of non-compliance with this medication. The patient should call the [facility] at [phone]  Option 4, then Option 4: Infectious Disease, Transplant to refill medication.    [name]   [facility]

## 2023-06-01 NOTE — Unmapped (Signed)
 [facility] Refill Coordination Note    Specialty Medication(s) to be Shipped:   Transplant: Envarsus 4mg     Other medication(s) to be shipped: No additional medications requested for fill at this time     Carolyn Kelley, DOB: Oct 18, 1990  Phone: [phone] (home)       All above HIPAA information was verified with patient.     Was a Nurse, learning disability used for this call? No    Completed refill call assessment today to schedule patient's medication shipment from the [facility]  [phone]).  All relevant notes have been reviewed.     Specialty medication(s) and dose(s) confirmed: Regimen is correct and unchanged.   Changes to medications: Carolyn Kelley reports no changes at this time.  Changes to insurance: No  New side effects reported not previously addressed with a pharmacist or physician: None reported  Questions for the pharmacist: No    Confirmed patient received a Conservation officer, historic buildings and a Surveyor, mining with first shipment. The patient will receive a drug information handout for each medication shipped and additional FDA Medication Guides as required.       DISEASE/MEDICATION-SPECIFIC INFORMATION        N/A    SPECIALTY MEDICATION ADHERENCE     Medication Adherence    Patient reported X missed doses in the last month: 0  Specialty Medication: ENVARSUS XR 4 mg Tb24 extended release tablet (tacrolimus)  Patient is on additional specialty medications: No  Patient is on more than two specialty medications: No  Any gaps in refill history greater than 2 weeks in the last 3 months: no  Demonstrates understanding of importance of adherence: yes  Informant: patient  Confirmed plan for next specialty medication refill: delivery by pharmacy  Refills needed for supportive medications: not needed          Refill Coordination    Has the Patients' Contact Information Changed: No  Is the Shipping Address Different: No         Were doses missed due to medication being on hold? No    ENVARSUS XR 4  mg: 3 days of medicine on hand       REFERRAL TO PHARMACIST     Referral to the pharmacist: Not needed      [facility]     Shipping address confirmed in Epic.     Cost and Payment: Patient has a $0 copay, payment information is not required.    Delivery Scheduled: Yes, Expected medication delivery date: 06/03/23.     Medication will be delivered via UPS to the prescription address in Epic WAM.    Kerby Less   [facility]  Specialty Technician

## 2023-06-02 MED FILL — ENVARSUS XR 4 MG TABLET,EXTENDED RELEASE: ORAL | 30 days supply | Qty: 30 | Fill #2

## 2023-06-14 DIAGNOSIS — Z940 Kidney transplant status: Principal | ICD-10-CM

## 2023-07-11 NOTE — Unmapped (Signed)
 The [facility] has made a second and final attempt to reach this patient to refill the following medication:Envarsus  and Prednisone .      We have left voicemails on the following phone numbers: [phone], have been unable to leave messages on the following phone numbers: [phone], have sent a MyChart message, have sent a text message to the following phone numbers: [phone], and have sent a Mychart questionnaire..    Dates contacted: 07/06/2023  07/11/2023  Last scheduled delivery: 06/03/2023- tacrolimus :Envarsus  XR 4 mg    The patient may be at risk of non-compliance with this medication. The patient should call the [facility] at [phone]  Option 4, then Option 4: Infectious Disease, Transplant to refill medication.    Carolyn Kelley

## 2023-07-12 DIAGNOSIS — Z940 Kidney transplant status: Principal | ICD-10-CM

## 2023-07-21 ENCOUNTER — Encounter: Payer: Self-pay | Admitting: Physical Medicine & Rehabilitation

## 2023-07-21 ENCOUNTER — Encounter: Payer: Medicaid Other | Attending: Physical Medicine & Rehabilitation | Admitting: Physical Medicine & Rehabilitation

## 2023-07-21 VITALS — BP 147/90 | HR 96 | Ht 62.0 in | Wt 180.2 lb

## 2023-07-21 DIAGNOSIS — Z8669 Personal history of other diseases of the nervous system and sense organs: Secondary | ICD-10-CM

## 2023-07-21 DIAGNOSIS — G253 Myoclonus: Secondary | ICD-10-CM

## 2023-07-21 MED ORDER — CLONAZEPAM 0.5 MG PO TABS: 0.5000 mg | ORAL_TABLET | Freq: Two times a day (BID) | ORAL | 3 refills | Status: DC

## 2023-07-21 NOTE — Progress Notes (Signed)
 Subjective:    Patient ID: Heather Sims, female    DOB: January 20, 1991, 33 y.o.   MRN: [id number]  HPI  Heather Sims is here in follow up of her myoclonus. She got her degree in psychology form [employer] in the Fall. She is now in Engineer, maintenance (IT) school and working at [employer]. She stays busy!  It is fatiguing for her especially at work when she has to stand hours on end. The pain is typically in her feet. She also may have myoclonus as well after standing.  A stool would help as she works in the dressing room primarily, sometimes in stock room also.   She continues to use a walker for balance. She remains stable with that.   She's now off keppra . She continues on klonopin  0. 5mg  bid  Pain Inventory Average Pain 0 Pain Right Now 0 My pain is intermittent and sharp  plantar fasciitis  In the last 24 hours, has pain interfered with the following? General activity 0 Relation with others 0 Enjoyment of life 0 What TIME of day is your pain at its worst? daytime and evening Sleep (in general) Poor  Pain is worse with: standing Pain improves with: rest Relief from Meds: 1  Family History  Problem Relation Age of Onset   Hypertension Mother    Diabetes Father    Hyperlipidemia Father    Social History   Socioeconomic History   Marital status: Single    Spouse name: Not on file   Number of children: 0   Years of education: college jr   Highest education level: Not on file  Occupational History   Not on file  Tobacco Use   Smoking status: Never   Smokeless tobacco: Never  Vaping Use   Vaping status: Never Used  Substance and Sexual Activity   Alcohol use: No    Alcohol/week: 0.0 standard drinks of alcohol   Drug use: No   Sexual activity: Never    Birth control/protection: Abstinence  Other Topics Concern   Not on file  Social History Narrative   Lives at home with her mother & brother   Right handed      ** Merged History Encounter **       Social Drivers of Research scientist (physical sciences) Strain: Low Risk  (09/29/2021)   Received [facility], [facility]   Overall Financial Resource Strain (CARDIA)    Difficulty of Paying Living Expenses: Not very hard  Recent Concern: Financial Resource Strain - High Risk (08/20/2021)   Received from [facility], [facility]   Overall Financial Resource Strain (CARDIA)    Difficulty of Paying Living Expenses: Hard  Food Insecurity: No Food Insecurity (09/29/2021)   Received [facility], [facility]   Hunger Vital Sign    Worried About Running Out of Food in the Last Year: Never true    Ran Out of Food in the Last Year: Never true  Transportation Needs: No Transportation Needs (09/29/2021)   Received from [facility], [facility]   [facility] - Transportation    Lack of Transportation (Medical): No    Lack of Transportation (Non-Medical): No  Physical Activity: Sufficiently Active (08/20/2021)   Received from [facility], [facility]   Exercise Vital Sign    Days of Exercise per Week: 5 days    Minutes of Exercise per Session: 60 min  Stress: Patient Declined (08/20/2021)   [facility]   [employer] of Occupational Health - Occupational Stress Questionnaire    Feeling of Stress : Patient declined  Social Connections: Unknown (08/27/2022)   Received from [facility]   Social Network    Social Network: Not on file   Past Surgical History:  Procedure Laterality Date   AV FISTULA PLACEMENT  12/18/2011   Procedure: ARTERIOVENOUS (AV) FISTULA CREATION;  Surgeon: Dannis Dy, MD;  Location: [facility] OR;  Service: Vascular;  Laterality: Left;   INSERTION OF DIALYSIS CATHETER Right 08/06/2014   Procedure: INSERTION OF DIALYSIS CATHETER;  Surgeon: Richrd Char, MD;  Location: [facility] OR;  Service: Vascular;  Laterality: Right;   RENAL BIOPSY     REVISON OF ARTERIOVENOUS FISTULA Left 08/06/2014   Procedure: PLICATION OF ARTERIOVENOUS FISTULA;  Surgeon: Richrd Char, MD;  Location: [facility] OR;  Service: Vascular;  Laterality: Left;   REVISON OF ARTERIOVENOUS FISTULA Left 03/12/2017   Procedure: REVISION PLICATION OF ARTERIOVENOUS FISTULA LEFT UPPER ARM;  Surgeon: Margherita Shell, MD;  Location: [facility] OR;  Service: Vascular;  Laterality: Left;   Past Surgical History:  Procedure Laterality Date   AV FISTULA PLACEMENT  12/18/2011   Procedure: ARTERIOVENOUS (AV) FISTULA CREATION;  Surgeon: Dannis Dy, MD;  Location: [facility] OR;  Service: Vascular;  Laterality: Left;   INSERTION OF DIALYSIS CATHETER Right 08/06/2014   Procedure: INSERTION OF DIALYSIS CATHETER;  Surgeon: Richrd Char, MD;  Location: [facility] OR;  Service: Vascular;  Laterality: Right;   RENAL BIOPSY     REVISON OF ARTERIOVENOUS FISTULA Left 08/06/2014   Procedure: PLICATION OF ARTERIOVENOUS FISTULA;  Surgeon: Richrd Char, MD;  Location: [facility] OR;  Service: Vascular;  Laterality: Left;   REVISON OF ARTERIOVENOUS FISTULA Left 03/12/2017   Procedure: REVISION PLICATION OF ARTERIOVENOUS FISTULA LEFT UPPER ARM;  Surgeon: Margherita Shell, MD;  Location: [facility] OR;  Service: Vascular;  Laterality: Left;   Past Medical History:  Diagnosis Date   Anemia    Brain injury (HCC)    Cardiac arrest (HCC) 03/26/2011   Cardiomyopathy    CHF (congestive heart failure) (HCC)    Chronic kidney disease    [facility]- [place]   Depression    Family history of adverse reaction to anesthesia    Mother hard to wake up.   Gait disorder 01/11/2013   Heart failure    History of blood transfusion 11/2011   Hypertension    Ileitis    MPGN (membranoproliferative glomerulonephritis), type 2    Pneumonia    'walking pneumonia'   Renal disorder    Seizures (HCC)    2013- last time   BP (!) 147/90   Pulse 96   Ht 5\' 2"  (1.575 m)   Wt 180 lb 3.2 oz (81.7 kg)   SpO2 98%   BMI 32.96 kg/m   Opioid Risk Score:   Fall Risk Score:  `1  Depression screen [facility] 2/9     07/21/2023    9:14 AM 07/23/2021    9:26 AM  10/18/2017    9:12 AM 07/02/2014   12:33 PM  Depression screen PHQ 2/9  Decreased Interest 0 0 1 1  Down, Depressed, Hopeless 0 0 1 1  PHQ - 2 Score 0 0 2 2  Altered sleeping    1  Tired, decreased energy    1  Change in appetite    0  Feeling bad or failure about yourself     2  Trouble concentrating  0  Moving slowly or fidgety/restless    0  Suicidal thoughts    0  PHQ-9 Score    6     Review of Systems  Musculoskeletal:  Positive for gait problem.  All other systems reviewed and are negative.      Objective:   Physical Exam  General: No acute distress HEENT: NCAT, EOMI, oral membranes moist Cards: reg rate  Chest: normal effort Abdomen: Soft, NT, ND Skin: dry, intact Extremities: no edema Psych: pleasant and appropriate  Musculoskeletal: right medial arch discomfort with stretching of arch  Skin:       General: Skin is warm and dry.  Neurological:     Mental Status: She is alert and oriented to person, place, and time.very engaging. Improved insight and awareness.   No clonus. Some steppage to gait at times but really very fluid.  Ambulated without walker for me.  Balance excellent. Strength 5/5                   Assessment & Plan:  1. 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 2. Myoclonus: Continue Klonopin                 - 0.5mg  bid   --RF today  -needs form for work.  Controlled Substance Reporting system. 4. Seizure Disorder: Neurology Following.  5. ESRD: S/P Transplant: Nephrology Following.  6. Attention deficits, memory dysfunction             - graduated from college with psychology degree.  7. Right arch pain. Mild plantar fasciitis   -stretches were provided. Ice, appropriate arch and heel support in shoes Over 20 minutes of face to face patient care time was spent during this visit. All questions were encouraged and answered. f/u in a year.

## 2023-07-21 NOTE — Patient Instructions (Signed)
 ALWAYS FEEL FREE TO CALL OUR OFFICE WITH ANY PROBLEMS OR QUESTIONS [phone])  **PLEASE NOTE** ALL MEDICATION REFILL REQUESTS (INCLUDING CONTROLLED SUBSTANCES) NEED TO BE MADE AT LEAST 7 DAYS PRIOR TO REFILL BEING DUE. ANY REFILL REQUESTS INSIDE THAT TIME FRAME MAY RESULT IN DELAYS IN RECEIVING YOUR PRESCRIPTION.

## 2023-08-03 NOTE — Unmapped (Signed)
 [facility] Specialty and Home Delivery Pharmacy Refill Coordination Note    Specialty Medication(s) to be Shipped:   Transplant: Envarsus  XR 4 mg and Prednisone  5mg     Other medication(s) to be shipped: No additional medications requested for fill at this time     Carolyn Kelley, DOB: [birth year]  Phone: [phone] (home)       All above HIPAA information was verified with patient.     Was a Nurse, learning disability used for this call? No    Completed refill call assessment today to schedule patient's medication shipment from the [facility] and [facility] Delivery Pharmacy  [phone]).  All relevant notes have been reviewed.     Specialty medication(s) and dose(s) confirmed: Regimen is correct and unchanged.   Changes to medications: Safiyya reports no changes at this time.  Changes to insurance: No  New side effects reported not previously addressed with a pharmacist or physician: None reported  Questions for the pharmacist: No    Confirmed patient received a Conservation officer, historic buildings and a Surveyor, mining with first shipment. The patient will receive a drug information handout for each medication shipped and additional FDA Medication Guides as required.       DISEASE/MEDICATION-SPECIFIC INFORMATION        N/A    SPECIALTY MEDICATION ADHERENCE     Medication Adherence    Patient reported X missed doses in the last month: 0  Specialty Medication: ENVARSUS  XR 4 mg Tb24 extended release tablet (tacrolimus )  Patient is on additional specialty medications: Yes  Additional Specialty Medications: predniSONE  5 MG tablet (DELTASONE )  Patient Reported Additional Medication X Missed Doses in the Last Month: 0  Patient is on more than two specialty medications: No  Any gaps in refill history greater than 2 weeks in the last 3 months: no  Demonstrates understanding of importance of adherence: yes              Were doses missed due to medication being on hold? No    ENVARSUS  XR 4   mg: 2 days of medicine on hand   predniSONE  5  mg: 5 days of medicine on hand       REFERRAL TO PHARMACIST     Referral to the pharmacist: Not needed      [facility]     Shipping address confirmed in Epic.     Cost and Payment: Patient has a copay of $8.00. They are aware and have authorized the pharmacy to charge the credit card on file.    Delivery Scheduled: Yes, Expected medication delivery date: 08/05/23.     Medication will be delivered via UPS to the prescription address in Epic WAM.    Stephen Ehrlich   [facility] Specialty and Home Delivery Pharmacy  Specialty Technician

## 2023-08-04 MED FILL — ENVARSUS XR 4 MG TABLET,EXTENDED RELEASE: ORAL | 30 days supply | Qty: 30 | Fill #3

## 2023-08-04 MED FILL — PREDNISONE 5 MG TABLET: ORAL | 30 days supply | Qty: 30 | Fill #1

## 2023-08-06 DIAGNOSIS — Z940 Kidney transplant status: Principal | ICD-10-CM

## 2023-08-09 DIAGNOSIS — Z940 Kidney transplant status: Principal | ICD-10-CM

## 2023-08-20 ENCOUNTER — Ambulatory Visit: Admit: 2023-08-20 | Discharge: 2023-08-21 | Payer: Medicaid (Managed Care) | Attending: Nephrology | Primary: Nephrology

## 2023-08-20 ENCOUNTER — Other Ambulatory Visit: Admit: 2023-08-20 | Discharge: 2023-08-21 | Payer: Medicaid (Managed Care)

## 2023-08-20 DIAGNOSIS — D849 Immunodeficiency, unspecified: Principal | ICD-10-CM

## 2023-08-20 DIAGNOSIS — Z940 Kidney transplant status: Principal | ICD-10-CM

## 2023-08-20 DIAGNOSIS — I10 Essential (primary) hypertension: Principal | ICD-10-CM

## 2023-08-20 DIAGNOSIS — Z79899 Other long term (current) drug therapy: Principal | ICD-10-CM

## 2023-08-20 DIAGNOSIS — N2581 Secondary hyperparathyroidism of renal origin: Principal | ICD-10-CM

## 2023-08-20 DIAGNOSIS — G253 Myoclonus: Principal | ICD-10-CM

## 2023-08-20 DIAGNOSIS — R569 Unspecified convulsions: Principal | ICD-10-CM

## 2023-08-20 DIAGNOSIS — N058 Unspecified nephritic syndrome with other morphologic changes: Principal | ICD-10-CM

## 2023-08-20 LAB — HEPATIC FUNCTION PANEL
ALBUMIN: 4 g/dL (ref 3.4–5.0)
ALKALINE PHOSPHATASE: 82 U/L (ref 46–116)
ALT (SGPT): 36 U/L (ref 10–49)
AST (SGOT): 38 U/L — ABNORMAL HIGH (ref <=34)
BILIRUBIN DIRECT: <0.1 mg/dL (ref 0.00–0.30)
BILIRUBIN TOTAL: 0.2 mg/dL — ABNORMAL LOW (ref 0.3–1.2)
PROTEIN TOTAL: 7.6 g/dL (ref 5.7–8.2)

## 2023-08-20 LAB — PROTEIN / CREATININE RATIO, URINE
CREATININE, URINE: 139.9 mg/dL
PROTEIN URINE: 46.2 mg/dL
PROTEIN/CREAT RATIO, URINE: 0.33

## 2023-08-20 LAB — BASIC METABOLIC PANEL
ANION GAP: 9 mmol/L (ref 5–14)
BLOOD UREA NITROGEN: 25 mg/dL — ABNORMAL HIGH (ref 9–23)
BUN / CREAT RATIO: 15
CALCIUM: 10.3 mg/dL (ref 8.7–10.4)
CHLORIDE: 105 mmol/L (ref 98–107)
CO2: 26.8 mmol/L (ref 20.0–31.0)
CREATININE: 1.68 mg/dL — ABNORMAL HIGH (ref 0.55–1.02)
EGFR CKD-EPI (2021) FEMALE: 41 mL/min/1.73m2 — ABNORMAL LOW (ref >=60)
GLUCOSE RANDOM: 92 mg/dL (ref 70–99)
POTASSIUM: 5.2 mmol/L — ABNORMAL HIGH (ref 3.4–4.8)
SODIUM: 141 mmol/L (ref 135–145)

## 2023-08-20 LAB — URINALYSIS WITH MICROSCOPY
BILIRUBIN UA: NEGATIVE
GLUCOSE UA: NEGATIVE
KETONES UA: NEGATIVE
LEUKOCYTE ESTERASE UA: NEGATIVE
NITRITE UA: NEGATIVE
PH UA: 6.5 (ref 5.0–9.0)
RBC UA: 30 /HPF — ABNORMAL HIGH (ref <=4)
SPECIFIC GRAVITY UA: 1.018 (ref 1.003–1.030)
SQUAMOUS EPITHELIAL: 13 /HPF — ABNORMAL HIGH (ref 0–5)
UROBILINOGEN UA: <2
WBC UA: 2 /HPF (ref 0–5)

## 2023-08-20 LAB — ALBUMIN / CREATININE URINE RATIO
ALBUMIN QUANT URINE: 17.2 mg/dL
ALBUMIN/CREATININE RATIO: 122.9 ug/mg — ABNORMAL HIGH (ref 0.0–30.0)
CREATININE, URINE: 139.9 mg/dL

## 2023-08-20 LAB — CBC W/ AUTO DIFF
BASOPHILS ABSOLUTE COUNT: 0 10*9/L (ref 0.0–0.1)
BASOPHILS RELATIVE PERCENT: 1 %
EOSINOPHILS ABSOLUTE COUNT: 0.3 10*9/L (ref 0.0–0.5)
EOSINOPHILS RELATIVE PERCENT: 8 %
HEMATOCRIT: 34 % (ref 34.0–44.0)
HEMOGLOBIN: 10.9 g/dL — ABNORMAL LOW (ref 11.3–14.9)
LYMPHOCYTES ABSOLUTE COUNT: 1.3 10*9/L (ref 1.1–3.6)
LYMPHOCYTES RELATIVE PERCENT: 38.4 %
MEAN CORPUSCULAR HEMOGLOBIN CONC: 32.2 g/dL (ref 32.0–36.0)
MEAN CORPUSCULAR HEMOGLOBIN: 29.3 pg (ref 25.9–32.4)
MEAN CORPUSCULAR VOLUME: 90.9 fL (ref 77.6–95.7)
MEAN PLATELET VOLUME: 8 fL (ref 6.8–10.7)
MONOCYTES ABSOLUTE COUNT: 0.6 10*9/L (ref 0.3–0.8)
MONOCYTES RELATIVE PERCENT: 16.7 %
NEUTROPHILS ABSOLUTE COUNT: 1.3 10*9/L — ABNORMAL LOW (ref 1.8–7.8)
NEUTROPHILS RELATIVE PERCENT: 35.9 %
PLATELET COUNT: 223 10*9/L (ref 150–450)
RED BLOOD CELL COUNT: 3.74 10*12/L — ABNORMAL LOW (ref 3.95–5.13)
RED CELL DISTRIBUTION WIDTH: 13.9 % (ref 12.2–15.2)
WBC ADJUSTED: 3.5 10*9/L — ABNORMAL LOW (ref 3.6–11.2)

## 2023-08-20 LAB — TACROLIMUS LEVEL: TACROLIMUS BLOOD: 15.4 ng/mL

## 2023-08-20 LAB — MAGNESIUM: MAGNESIUM: 1.3 mg/dL — ABNORMAL LOW (ref 1.6–2.6)

## 2023-08-20 LAB — CMV DNA, QUANTITATIVE, PCR
CMV QUANT: <35 [IU]/mL — ABNORMAL HIGH (ref <0)
CMV VIRAL LD: DETECTED — AB

## 2023-08-20 LAB — BK VIRUS QUANTITATIVE PCR, BLOOD: BK BLOOD RESULT: NOT DETECTED

## 2023-08-20 LAB — PHOSPHORUS: PHOSPHORUS: 3.9 mg/dL (ref 2.4–5.1)

## 2023-08-20 MED ORDER — TACROLIMUS XR 4 MG TABLET,EXTENDED RELEASE 24 HR
ORAL_TABLET | Freq: Every day | ORAL | 11 refills | 30.00000 days | Status: CP
Start: 2023-08-20 — End: ?

## 2023-08-20 MED ORDER — TACROLIMUS XR 1 MG TABLET,EXTENDED RELEASE 24 HR
ORAL_TABLET | Freq: Every day | ORAL | 11 refills | 30.00000 days | Status: CP
Start: 2023-08-20 — End: ?

## 2023-08-20 MED ORDER — MYCOPHENOLATE SODIUM 180 MG TABLET,DELAYED RELEASE
ORAL_TABLET | Freq: Two times a day (BID) | ORAL | 11 refills | 30.00000 days | Status: CP
Start: 2023-08-20 — End: 2024-08-19
  Filled 2023-10-07: qty 240, 30d supply, fill #0

## 2023-08-20 NOTE — Unmapped (Signed)
 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 She is seen in follow up of her kidney transplant and immunosuppression management.     S/P deceased donor kidney transplant with C3 glomeruloneprhritis recurrence   - Kidney biopsy 12/29/19 confirmed presence of C3 glomerulopathy recurrence.   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   - Genetics panel was negative for causative variants 08/30/20.     - Treatment of C3G included an increase in the dose of Myfortic  to 720 mg bid and initiation of prednisone  5 mg daily  - Rituximab was deferred at the time of recurrence diagnosis due to an active COVID-19 community spike and concern about infection risk. With improvement in proteinuria on current immunosuppression we have not reconsidered rituximab.  - Serum creatinine is stable at 1.68 mg/dL with recent baseline in the range of 1.2-1.7 mg/dL. Baseline prior to C3 GN recurrence was 0.9-1.4 mg/dL.    - UPC is 0.33 with UACR 122.9 consistent with recent values.    - UA with 30 RBCs, 13 squamous epithelial cells.  - She has been prepared for use of Eculizumab in case current therapy or rituximab proves ineffective in the future. This included completion of pre-eculizumab vaccination     - C3 was normal 11/20/22  - DSA screens have been historically negative for DSAs (most recently 11/20/22)  - BK viral load undetectable 03/26/23    Immunosuppression Management [High Risk Medical Decision Making For Drug Therapy Requiring Intensive Monitoring For Toxicity]:   - Tacrolimus  level is pending (target trough is 6-10 ng/mL).   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 Completed a 10 day course of Cipro   - Denies current UTI symptoms.   - UA today does not appear to be a clean catch  - Will await urine culture results    Immunizations  - COVID-19 vaccination: last booster 01/22/23.   - Received Men B on 01/26/20,03/01/20, 03/21/20, 11/07/21  - Received MenACWY 01/26/20, 04/29/11  - Men C 04/29/11  - Booster doses of meningococcal vaccine every 1-5 years per CDC recommendations if chronic eculizumab therapy is initiated.  - Flu vaccine 01/22/23    Seizures Disorder, no seizures since 2014:    - EEG with no evidence of seizures on 06/25/20 .   - Keppra  stopped > 1 year ago with no recurrence of seizures  - Follow up with Jeanmarie Millet, NP at [facility].       Myoclonus:   - Patient linked worsening myoclonus with prednisone  and is now taking prednisone  only 2 days per week.  - Will  continue prn clonazepam   - Continue [facility] physical therapy.      Metabolic Acidosis  - CO2 is 26.8   - Continue sodium bicarbonate  650 mg bid      Pseudoaneurysm of AVF  She saw Dr. Corky Diener 09/21/19 with plan to defer any intervention until 1 year had passed from transplant. She has no high output heart failure by echo or symptoms of CHF. We would favor a nonurgent revision to minimize any risk of rupture, but spare the AVF for future use if possible to do so safely. Patient has been advised to contact us  with any scabbing or erosion of the skin over the pseudoaneurysm.  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    Pap Smear: 11/11/20 was negative including negative HPV.   - GYN follow up scheduled for next week  Renal US  01/15/23 with no masses in native kidneys    Follow up  Transplant clinic in 4 months    History of Present Illness  Carolyn Kelley is a 33 y.o. 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 Her post-operative course was complicated by hypotension and hypoxia thought to be caused by an anaphylactic reaction to Campath. Due to presence of proteinuria she underwent kidney biopsy on 12/29/19 revealing C3 glomerulopathy recurrence. In response to this diagnosis Myfortic  was increased from 540 mg bid to 720 mg bid and she was started on prednisone  5 mg daily while tacrolimus  was continued. Creatinine has subsequently been stable and UPC  has remained < 0.5. She has experienced no episodes of rejection or infectious complications.     She continues to have tremors and headaches. These are not worse. Headaches are typically responsive to acetaminophen . She denies seizure activity. Myoclonus is unchanged and treated with Klonopin .     She denies dysuria, allograft site tenderness, chest pain, shortness of breath, edema, nausea, vomiting, diarrhea, or constipation.  The pseudoaneurysm site is unchanged.     She has not been checking home BP values. Mg and NaHCO3 are taken intermittently. She is adherent with other medications.    Immunosuppression: Envarsus  7 mg daily, Myfortic  720 mg bid, and prednisone  5 mg 3 days per week.     Last dose of Envarsus : 11 AM yesterday    Transplant History:  Date of Transplant: 03/10/19  Organ Received: Left DDKT, DBD, SCD, KDPI 2%; cold ischemia 18 hrs.  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    Medications  Current Outpatient Medications   Medication Sig Dispense Refill    acetaminophen  (TYLENOL ) 500 MG tablet Take 1-2 tablets (500-1,000 mg total) by mouth every six (6) hours  as needed for pain or fever (> 38C). 100 tablet 0    clonazePAM  (KLONOPIN ) 0.5 MG tablet Take 1 tablet (0.5 mg total) by mouth two (2) times a day. 60 tablet 3    magnesium oxide-Mg AA chelate (MAGNESIUM, AMINO ACID CHELATE,) 133 mg Take 2 tablets by mouth Two (2) times a day. 120 tablet 11    miSOPROStoL (CYTOTEC) 200 MCG tablet INSERT 1 TABLET VAGINALLY NIGHT PRIOR TO PROCEDURE      mycophenolate  (MYFORTIC ) 180 MG EC tablet Take 4 tablets (720 mg total) by mouth Two (2) times a day. Adjust dose per medication card. 240 tablet 11    predniSONE  (DELTASONE ) 5 MG tablet Take 1 tablet (5 mg total) by mouth daily. 30 tablet 11    sodium bicarbonate , bulk, Powd Take 2 tablets by mouth daily.      tacrolimus  (ENVARSUS  XR) 1 mg Tb24 extended release tablet Take three (1mg ) tablets with one (4mg ) tablet by mouth daily for a total daily dose of 7 mg. 90 tablet 11    tacrolimus  (ENVARSUS  XR) 4 mg Tb24 extended release tablet Take one (4mg ) tablet with three (1mg ) tablets by mouth daily for a total daily dose of 7 mg. 30 tablet 11    tretinoin  (RETIN-A ) 0.025 % cream APPLY TO AFFECTED AREA(S) EVERY NIGHT AT BEDTIME AS TOLERATED 45 g 3     No current facility-administered medications for this visit. Physical Exam   BP 137/91 (BP Site: R Arm, BP Position: Sitting, BP Cuff Size: Medium)  - Pulse 94  - Temp 36.8 ??C (98.2 ??F) (Temporal)  - Wt 81.5 kg (179 lb 9.6 oz)  - LMP  (Within Months)  - BMI 32.85 kg/m??    General: Patient is a pleasant female appears drowsy seated in wheelchair  Eyes: Sclera anicteric.  Lungs: Clear to auscultation bilaterally, no wheezes/rales/rhonchi.  Cardiovascular: Regular rate and rhythm without murmurs, rubs or gallops.  Abdomen: soft, non-tender  Extremities: Without edema, joints without evidence of synovitis; pseudoaneurysm of AVF LUE with no scabs or further thinning of skin over site.  Skin: Without rash  Neurological: uses a walker for ambulation due to myoclonus.       Laboratory Results and imaging Reviewed in EMR

## 2023-08-20 NOTE — Unmapped (Signed)
 I spent 15 minutes with the patient at her clinic visit. I reviewed and updated her medications. She took her envarsus  at 1130 yesterday morning. She is not taking the NaCO2 or the  magnesium on a regular basis. I told her to try to improve that. She is only taking the prednisone  2 x week but she stated she was told that was ok at her previous visit.    Fever: No  Chills: No    Pain: no    Painful urination:No    Nausea: Yes---when she does not take the NACO2  Vomiting: No  Diarrhea: No    Chest Pain: No    Tremors: No  Headaches: No  Numbness/Tingling: No    Edema: No  SOB: No  Intake:   Output:    BP readings: not checking at home  Dizziness/Lightheadedness:No    Other issues/concerns:

## 2023-08-23 DIAGNOSIS — Z79899 Other long term (current) drug therapy: Principal | ICD-10-CM

## 2023-08-23 DIAGNOSIS — Z940 Kidney transplant status: Principal | ICD-10-CM

## 2023-08-23 MED ORDER — TACROLIMUS XR 1 MG TABLET,EXTENDED RELEASE 24 HR
ORAL_TABLET | Freq: Every day | ORAL | 11 refills | 30.00000 days | Status: CP
Start: 2023-08-23 — End: 2024-08-22
  Filled 2023-08-30: qty 30, 30d supply, fill #0

## 2023-08-23 MED ORDER — TACROLIMUS XR 4 MG TABLET,EXTENDED RELEASE 24 HR
ORAL_TABLET | Freq: Every day | ORAL | 11 refills | 30.00000 days | Status: CP
Start: 2023-08-23 — End: ?

## 2023-08-23 NOTE — Unmapped (Signed)
 Reviewed tac level with Rice Chamorro PharmD, decrease envarsus  6mg  daily      Called and updated patient via detailed VM to reduce dose. Asked her to call back with questions or concerns

## 2023-08-24 NOTE — Unmapped (Addendum)
 [facility] Pharmacist has reviewed a new prescription for Envarsus  that indicates a dose decrease.  Patient was counseled on this dosage change by SW- see epic note from 6/2.  Next refill call date adjusted if necessary.      Clinical Assessment Needed For: Dose Change  Medication: Envarsus  xr 1 mg   Last Fill Date/Day Supply: 03/11/2023 / 30  Refill Too Soon until 09/09/23  Was previous dose already scheduled to fill: No    Notes to Pharmacist: Will retest

## 2023-08-27 NOTE — Unmapped (Signed)
 [facility] Specialty and Home Delivery Pharmacy Refill Coordination Note    Specialty Medication(s) to be Shipped:   Transplant: Envarsus  4mg     Other medication(s) to be shipped: No additional medications requested for fill at this time     Carolyn Kelley, DOB: 1991/03/13  Phone: [phone] (home)       All above HIPAA information was verified with patient.     Was a Nurse, learning disability used for this call? No    Completed refill call assessment today to schedule patient's medication shipment from the [facility] and Home Delivery Pharmacy  [phone]).  All relevant notes have been reviewed.     Specialty medication(s) and dose(s) confirmed: Regimen is correct and unchanged.   Changes to medications: Carolyn Kelley reports no changes at this time.  Changes to insurance: No  New side effects reported not previously addressed with a pharmacist or physician: None reported  Questions for the pharmacist: No    Confirmed patient received a Conservation officer, historic buildings and a Surveyor, mining with first shipment. The patient will receive a drug information handout for each medication shipped and additional FDA Medication Guides as required.       DISEASE/MEDICATION-SPECIFIC INFORMATION        N/A    SPECIALTY MEDICATION ADHERENCE     Medication Adherence    Patient reported X missed doses in the last month: 0  Specialty Medication: tacrolimus : ENVARSUS  XR 4 mg Tb24 extended release tablet  Patient is on additional specialty medications: Yes  Additional Specialty Medications: tacrolimus : ENVARSUS  XR 1 mg Tb24 extended release tablet  Patient Reported Additional Medication X Missed Doses in the Last Month: 0  Patient is on more than two specialty medications: No              Were doses missed due to medication being on hold? No      tacrolimus : ENVARSUS  XR 4 mg Tb24 extended release tablet  : 7 days of medicine on hand       REFERRAL TO PHARMACIST     Referral to the pharmacist: Not needed      [facility]     Shipping address confirmed in Epic. Cost and Payment: Patient has a copay of $4. They are aware and have authorized the pharmacy to charge the credit card on file.    Delivery Scheduled: Yes, Expected medication delivery date: 08/31/23.     Medication will be delivered via UPS to the prescription address in Epic WAM.    Carolyn Kelley   [facility] Specialty and Home Delivery Pharmacy  Specialty Technician

## 2023-09-06 DIAGNOSIS — Z940 Kidney transplant status: Principal | ICD-10-CM

## 2023-09-10 NOTE — Unmapped (Signed)
 Therapy Update Follow Up: No issues - Copay = $4.00

## 2023-10-04 DIAGNOSIS — Z940 Kidney transplant status: Principal | ICD-10-CM

## 2023-10-06 NOTE — Unmapped (Signed)
 [facility] Refill Coordination Note    Specialty Medication(s) to be Shipped:   Transplant: Envarsus  XR 4 mg and mycophenolate  mofetil 180mg     Other medication(s) to be shipped: No additional medications requested for fill at this time     Carolyn Kelley, DOB: 1990-11-23  Phone: [phone] (home)       All above HIPAA information was verified with patient.     Was a Nurse, learning disability used for this call? No    Completed refill call assessment today to schedule patient's medication shipment from the [facility]  [phone]).  All relevant notes have been reviewed.     Specialty medication(s) and dose(s) confirmed: Regimen is correct and unchanged.   Changes to medications: Allyssa reports no changes at this time.  Changes to insurance: No  New side effects reported not previously addressed with a pharmacist or physician: None reported  Questions for the pharmacist: No    Confirmed patient received a Conservation officer, historic buildings and a Surveyor, mining with first shipment. The patient will receive a drug information handout for each medication shipped and additional FDA Medication Guides as required.       DISEASE/MEDICATION-SPECIFIC INFORMATION        N/A    SPECIALTY MEDICATION ADHERENCE     Medication Adherence    Patient reported X missed doses in the last month: 0  Specialty Medication: tacrolimus : ENVARSUS  XR 4 mg Tb24 extended release tablet  Patient is on additional specialty medications: Yes  Additional Specialty Medications: tacrolimus : ENVARSUS  XR 1 mg Tb24 extended release tablet  Patient Reported Additional Medication X Missed Doses in the Last Month: 0  Patient is on more than two specialty medications: Yes  Specialty Medication: mycophenolate  180 MG EC tablet (MYFORTIC )  Patient Reported Additional Medication X Missed Doses in the Last Month: 0              Were doses missed due to medication being on hold? No    tacrolimus : ENVARSUS  XR 4 mg Tb24 extended release tablet  4 days of medicine on hand   mycophenolate  180 MG EC tablet (MYFORTIC )  4 days of medicine on hand       REFERRAL TO PHARMACIST     Referral to the pharmacist: Not needed      [facility]     Shipping address confirmed in Epic.     Cost and Payment: Patient has a copay of $8.00 . They are aware and have authorized the pharmacy to charge the credit card on file.    Delivery Scheduled: Yes, Expected medication delivery date: 10/08/2023.     Medication will be delivered via UPS to the prescription address in Epic WAM.    Nelida Winfred [facility]  Specialty Technician

## 2023-10-06 NOTE — Unmapped (Signed)
 Carolyn Kelley has been contacted in regards to their refill of tacrolimus : ENVARSUS  XR 1 mg Tb24 extended release tablet. At this time, they have declined refill due to patient having 30 doses remaining. Refill assessment call date has been updated per the patient's request.

## 2023-10-06 NOTE — Unmapped (Signed)
 [facility] has made a second and final attempt to reach this patient to refill the following medication:mycophenolate , Envarsus  4mg  and 1mg .      We have left voicemails on the following phone numbers: [phone], have been unable to leave messages on the following phone numbers: [phone], and have sent a text message to the following phone numbers: [phone]. Pt has not accessed Mychart since 11/2022.    Dates contacted: 09/30/23 and 10/06/23  Last scheduled delivery: 04/08/23 (mycophenolate ), Envarsus  4mg  (08/30/23), and Envarsus  1mg  (03/11/23)    The patient may be at risk of non-compliance with this medication. The patient should call the [facility] at [phone]  Option 4, then Option 4: Infectious Disease, Transplant to refill medication.    Suzen Blood   [facility]

## 2023-10-07 MED FILL — ENVARSUS XR 4 MG TABLET,EXTENDED RELEASE: ORAL | 30 days supply | Qty: 30 | Fill #1

## 2023-11-01 DIAGNOSIS — Z940 Kidney transplant status: Principal | ICD-10-CM

## 2023-11-18 NOTE — Unmapped (Signed)
 Patient is filling Envarsus  1mg  and Mycophenolate  180mg  through a different pharmacy so declined refill of those. Only needs Envarsus  4mg  at this time.       [facility] Clinical Assessment & Refill Coordination Note    Lizzeth Meder, DOB: 1990/10/18  Phone: [phone] (home)     All above HIPAA information was verified with patient.     Was a Nurse, learning disability used for this call? No    Specialty Medication(s):   Transplant: Envarsus  1mg , Envarsus  4mg , and mycophenolic acid 180 mg     Current Medications[1]     Changes to medications: Tanice reports no changes at this time.    Medication list has been reviewed and updated in Epic: Yes    Allergies[2]    Changes to allergies: No    Allergies have been reviewed and updated in Epic: Yes    SPECIALTY MEDICATION ADHERENCE     Envarsus  XR 4 mg: 3 days of medicine on hand  Envarsus  XR 1 mg: >20 days of medicine on hand (patient is filling this through another pharmacy)  Mycophenolate  EC 180 mg: >14 days of medicine on hand (patient is filling this through another pharmacy)    Medication Adherence    Patient reported X missed doses in the last month: 1  Specialty Medication: Mycophenolate   Patient is on additional specialty medications: Yes  Additional Specialty Medications: Tacrolimus   Patient Reported Additional Medication X Missed Doses in the Last Month: 1  Patient is on more than two specialty medications: No  Informant: patient          Specialty medication(s) dose(s) confirmed: Regimen is correct and unchanged.     Are there any concerns with adherence? No    Adherence counseling provided? Not needed    CLINICAL MANAGEMENT AND INTERVENTION      Clinical Benefit Assessment:    Do you feel the medicine is effective or helping your condition? Yes    Clinical Benefit counseling provided? Not needed    Adverse Effects Assessment:    Are you experiencing any side effects? No    Are you experiencing difficulty administering your medicine? No    Quality of Life Assessment:    Quality of Life    Rheumatology  Oncology  Dermatology  Cystic Fibrosis          How many days over the past month did your condition  keep you from your normal activities? For example, brushing your teeth or getting up in the morning. Patient declined to answer    Have you discussed this with your provider? Not needed    Acute Infection Status:    Acute infections noted within Epic:  No active infections    Patient reported infection: None    Therapy Appropriateness:    Is therapy appropriate based on current medication list, adverse reactions, adherence, clinical benefit and progress toward achieving therapeutic goals? Yes, therapy is appropriate and should be continued     Clinical Intervention:    Was an intervention completed as part of this clinical assessment? No    DISEASE/MEDICATION-SPECIFIC INFORMATION      N/A    Solid Organ Transplant: Not Applicable    PATIENT SPECIFIC NEEDS     Does the patient have any physical, cognitive, or cultural barriers? No    Is the patient high risk? No    Does the patient require physician intervention or other additional services (i.e., nutrition, smoking cessation, social work)? No    Does  the patient have an additional or emergency contact listed in their chart? Yes    SOCIAL DETERMINANTS OF HEALTH     At the [facility], we have learned that life circumstances - like trouble affording food, housing, utilities, or transportation can affect the health of many of our patients.   That is why we wanted to ask: are you currently experiencing any life circumstances that are negatively impacting your health and/or quality of life? Patient declined to answer    Social Drivers of Health     Food Insecurity: No Food Insecurity (09/29/2021)    Hunger Vital Sign     Worried About Running Out of Food in the Last Year: Never true     Ran Out of Food in the Last Year: Never true   Tobacco Use: Low Risk  (08/20/2023)    Patient History     Smoking Tobacco Use: Never     Smokeless Tobacco Use: Never     Passive Exposure: Not on file   Transportation Needs: No Transportation Needs (09/29/2021)    PRAPARE - Transportation     Lack of Transportation (Medical): No     Lack of Transportation (Non-Medical): No   Alcohol Use: Not At Risk (08/20/2021)    Received from [facility]    AUDIT-C     Frequency of Alcohol Consumption: Never     Average Number of Drinks: Not on file     Frequency of Binge Drinking: Not on file   Housing: Unknown (08/20/2021)    Received from [facility] Stability Vital Sign     Unable to Pay for Housing in the Last Year: Patient refused     Number of Places Lived in the Last Year: Not on file     Unstable Housing in the Last Year: No   Physical Activity: Sufficiently Active (08/20/2021)    Received from [facility]    Exercise Vital Sign     Days of Exercise per Week: 5 days     Minutes of Exercise per Session: 60 min   Utilities: Not on file   Stress: Patient Declined (08/20/2021)    Received from [facility] of Occupational Health - Occupational Stress Questionnaire     Feeling of Stress : Patient declined   Interpersonal Safety: Not on file   Substance Use: Not on file (01/26/2023)   Intimate Partner Violence: Unknown (08/27/2022)    Received from [facility]    HITS     Physically Hurt: Not on file     Insult or Talk Down To: Not on file     Threaten Physical Harm: Not on file     Scream or Curse: Not on file   Social Connections: Unknown (08/27/2022)    Received from [facility]    Social Network     Social Network: Not on file   Financial Resource Strain: Low Risk  (09/29/2021)    Overall Financial Resource Strain (CARDIA)     Difficulty of Paying Living Expenses: Not very hard   Recent Concern: Financial Resource Strain - High Risk (08/20/2021)    Received from [facility]    Overall Financial Resource Strain (CARDIA)     Difficulty of Paying Living Expenses: Hard   Health Literacy: Not on file Internet Connectivity: Not on file       Would you be willing to receive help with any of the needs that you have identified today?  Not applicable       SHIPPING     Specialty Medication(s) to be Shipped:   Transplant: Envarsus  4mg     Other medication(s) to be shipped: No additional medications requested for fill at this time    Specialty Medications not needed at this time: Transplant: Envarsus  1mg  and mycophenolic acid 180 mg     Changes to insurance: No    Cost and Payment: Patient has a copay of $4. They are aware and have authorized the pharmacy to charge the credit card on file.    Delivery Scheduled: Yes, Expected medication delivery date: 11/29/2023.     Medication will be delivered via UPS to the confirmed prescription address in [facility].    The patient will receive a drug information handout for each medication shipped and additional FDA Medication Guides as required.  Verified that patient has previously received a Conservation officer, historic buildings and a Surveyor, mining.    The patient or caregiver noted above participated in the development of this care plan and knows that they can request review of or adjustments to the care plan at any time.      All of the patient's questions and concerns have been addressed.    Velma DELENA Eon, PharmD   [facility] Specialty Pharmacist         [1]   Current Outpatient Medications   Medication Sig Dispense Refill    acetaminophen  (TYLENOL ) 500 MG tablet Take 1-2 tablets (500-1,000 mg total) by mouth every six (6) hours as needed for pain or fever (> 38C). 100 tablet 0    clonazePAM  (KLONOPIN ) 0.5 MG tablet Take 1 tablet (0.5 mg total) by mouth two (2) times a day. 60 tablet 3    magnesium oxide-Mg AA chelate (MAGNESIUM, AMINO ACID CHELATE,) 133 mg Take 2 tablets by mouth Two (2) times a day. 120 tablet 11    miSOPROStoL (CYTOTEC) 200 MCG tablet INSERT 1 TABLET VAGINALLY NIGHT PRIOR TO PROCEDURE      mycophenolate  (MYFORTIC ) 180 MG EC tablet Take 4 tablets (720 mg total) by mouth Two (2) times a day. Adjust dose per medication card. 240 tablet 11    predniSONE  (DELTASONE ) 5 MG tablet Take 1 tablet (5 mg total) by mouth daily. 30 tablet 11    sodium bicarbonate , bulk, Powd Take 2 tablets by mouth daily.      tacrolimus  (ENVARSUS  XR) 1 mg Tb24 extended release tablet Take 2 tablets (2 mg total) by mouth daily. Take in addition to ONE 4mg  tab to equal 6mg  daily dose 60 tablet 11    tacrolimus  (ENVARSUS  XR) 4 mg Tb24 extended release tablet Take 1 tablet (4 mg total) by mouth daily. Take in addition to TWO 1mg  tabs to equal 6mg  daily dose 30 tablet 11    tretinoin  (RETIN-A ) 0.025 % cream APPLY TO AFFECTED AREA(S) EVERY NIGHT AT BEDTIME AS TOLERATED 45 g 3     No current facility-administered medications for this visit.   [2]   Allergies  Allergen Reactions    Bee Pollen Swelling     Sneezing, congestion  denies swelling.  Sneezing, congestion  denies swelling.      Cefepime  Hives    Pollen Extracts Swelling

## 2023-11-19 DIAGNOSIS — Z940 Kidney transplant status: Principal | ICD-10-CM

## 2023-11-25 MED ORDER — SODIUM BICARBONATE 650 MG TABLET
ORAL_TABLET | Freq: Two times a day (BID) | ORAL | 11 refills | 30.00000 days | Status: CP
Start: 2023-11-25 — End: 2024-11-24

## 2023-11-26 MED FILL — ENVARSUS XR 4 MG TABLET,EXTENDED RELEASE: ORAL | 30 days supply | Qty: 30 | Fill #2

## 2023-11-29 DIAGNOSIS — Z940 Kidney transplant status: Principal | ICD-10-CM

## 2023-12-13 NOTE — Unmapped (Signed)
 [facility] Refill Coordination Note    Specialty Medication(s) to be Shipped:   Transplant: Envarsus  4mg     Other medication(s) to be shipped: No additional medications requested for fill at this time    Specialty Medications not needed at this time: Transplant: mycophenolate  mofetil 180mg      Carolyn Kelley, DOB: Jul 03, 1990  Phone: [phone] (home)       All above HIPAA information was verified with patient.     Was a Nurse, learning disability used for this call? No    Completed refill call assessment today to schedule patient's medication shipment from the [facility]  [phone]).  All relevant notes have been reviewed.     Specialty medication(s) and dose(s) confirmed: Regimen is correct and unchanged.   Changes to medications: Brentney reports no changes at this time.  Changes to insurance: No  New side effects reported not previously addressed with a pharmacist or physician: None reported  Questions for the pharmacist: No    Confirmed patient received a Conservation officer, historic buildings and a Surveyor, mining with first shipment. The patient will receive a drug information handout for each medication shipped and additional FDA Medication Guides as required.       DISEASE/MEDICATION-SPECIFIC INFORMATION        N/A    SPECIALTY MEDICATION ADHERENCE     Medication Adherence    Patient reported X missed doses in the last month: 0  Specialty Medication: ENVARSUS  XR 4 mg Tb24 extended release tablet (tacrolimus )  Patient is on additional specialty medications: No              Were doses missed due to medication being on hold? No    ENVARSUS  XR 4 mg Tb24 extended release tablet (tacrolimus ): 7 days of medicine on hand       REFERRAL TO PHARMACIST     Referral to the pharmacist: Not needed      [facility]     Shipping address confirmed in Epic.     Cost and Payment: Patient has a copay of $4. They are aware and have authorized the pharmacy to charge the credit card on file.    Delivery Scheduled: Yes, Expected medication delivery date: 12/21/23.     Medication will be delivered via UPS to the prescription address in Epic WAM.    [name]   [facility] and [facility] Delivery [facility]  Specialty Technician

## 2023-12-20 ENCOUNTER — Other Ambulatory Visit: Payer: Self-pay | Admitting: Physical Medicine & Rehabilitation

## 2023-12-20 DIAGNOSIS — G253 Myoclonus: Secondary | ICD-10-CM

## 2023-12-20 MED FILL — ENVARSUS XR 4 MG TABLET,EXTENDED RELEASE: ORAL | 30 days supply | Qty: 30 | Fill #3

## 2023-12-27 DIAGNOSIS — Z940 Kidney transplant status: Principal | ICD-10-CM

## 2024-01-11 DIAGNOSIS — Z940 Kidney transplant status: Principal | ICD-10-CM

## 2024-01-20 NOTE — Progress Notes (Signed)
 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     Was a nurse, learning disability used for this call? No    Completed refill call assessment today to schedule patient's medication shipment from the [facility]  [phone]).  All relevant notes have been reviewed.     Specialty medication(s) and dose(s) confirmed: Regimen is correct and unchanged.   Changes to medications: Carolyn Kelley reports no changes at this time.  Changes to insurance: No  New side effects reported not previously addressed with a pharmacist or physician: None reported  Questions for the pharmacist: No    Confirmed patient received a Conservation Officer, Historic Buildings and a Surveyor, Mining with first shipment. The patient will receive a drug information handout for each medication shipped and additional FDA Medication Guides as required.       DISEASE/MEDICATION-SPECIFIC INFORMATION        N/A    SPECIALTY MEDICATION ADHERENCE     Medication Adherence    Patient reported X missed doses in the last month: 0  Specialty Medication: ENVARSUS  XR 4 mg Tb24 extended release tablet (tacrolimus )  Patient is on additional specialty medications: Yes  Additional Specialty Medications: mycophenolate  180 MG EC tablet (MYFORTIC )  Patient Reported Additional Medication X Missed Doses in the Last Month: 0  Patient is on more than two specialty medications: Yes  Specialty Medication: tacrolimus : ENVARSUS  XR 1 mg Tb24 extended release tablet  Patient Reported Additional Medication X Missed Doses in the Last Month: 0  Any gaps in refill history greater than 2 weeks in the last 3 months: no  Demonstrates understanding of importance of adherence: yes  Informant: patient  Confirmed plan for next specialty medication refill: delivery by pharmacy  Refills needed for supportive medications: not needed          Refill Coordination    Has the Patients' Contact Information Changed: No  Is the Shipping Address Different: No         Were doses missed due to medication being on hold? No    tacrolimus : ENVARSUS  XR 4  mg: 5 days of medicine on hand   mycophenolate  180  mg: 5 days of medicine on hand   tacrolimus : ENVARSUS  XR 1  mg: 5 days of medicine on hand       REFERRAL TO PHARMACIST     Referral to the pharmacist: Not needed      [facility]     Shipping address confirmed in Epic.     Cost and Payment: Patient has a copay of $16. They are aware and have authorized the pharmacy to charge the credit card on file.    Delivery Scheduled: Yes, Expected medication delivery date: 01/24/24.     Medication will be delivered via UPS to the prescription address in Epic WAM.    Suzen Blood   [facility]  Specialty Technician

## 2024-01-21 ENCOUNTER — Ambulatory Visit: Admit: 2024-01-21 | Discharge: 2024-01-21 | Payer: Medicaid (Managed Care)

## 2024-01-21 ENCOUNTER — Ambulatory Visit: Admit: 2024-01-21 | Discharge: 2024-01-21 | Payer: Medicaid (Managed Care) | Attending: Nephrology | Primary: Nephrology

## 2024-01-21 ENCOUNTER — Inpatient Hospital Stay: Admit: 2024-01-21 | Discharge: 2024-01-21 | Payer: Medicaid (Managed Care)

## 2024-01-21 DIAGNOSIS — Z940 Kidney transplant status: Principal | ICD-10-CM

## 2024-01-21 LAB — LIPID PANEL
CHOLESTEROL: 165 mg/dL (ref <200)
HDL CHOLESTEROL: 43 mg/dL — ABNORMAL LOW (ref >50)
LDL CHOLESTEROL CALCULATED: 109 mg/dL — ABNORMAL HIGH (ref <100)
NON-HDL CHOLESTEROL: 122 mg/dL (ref <130)
TRIGLYCERIDES: 83 mg/dL (ref <150)

## 2024-01-21 LAB — URINALYSIS WITH MICROSCOPY
BILIRUBIN UA: NEGATIVE
GLUCOSE UA: NEGATIVE
KETONES UA: NEGATIVE
LEUKOCYTE ESTERASE UA: NEGATIVE
NITRITE UA: NEGATIVE
PH UA: 6 (ref 5.0–9.0)
PROTEIN UA: 30 — AB
RBC UA: 23 /HPF — ABNORMAL HIGH (ref <=4)
RENAL TUBULAR EPITHELIAL CELLS: <1 /HPF — ABNORMAL HIGH (ref <=0)
SPECIFIC GRAVITY UA: 1.018 (ref 1.003–1.030)
SQUAMOUS EPITHELIAL: 8 /HPF — ABNORMAL HIGH (ref 0–5)
UROBILINOGEN UA: <2
WBC UA: 3 /HPF (ref 0–5)

## 2024-01-21 LAB — CBC W/ AUTO DIFF
BASOPHILS ABSOLUTE COUNT: 0 10*9/L (ref 0.0–0.1)
BASOPHILS RELATIVE PERCENT: 0.9 %
EOSINOPHILS ABSOLUTE COUNT: 0.2 10*9/L (ref 0.0–0.5)
EOSINOPHILS RELATIVE PERCENT: 4.9 %
HEMATOCRIT: 33.7 % — ABNORMAL LOW (ref 34.0–44.0)
HEMOGLOBIN: 11.3 g/dL (ref 11.3–14.9)
LYMPHOCYTES ABSOLUTE COUNT: 1.1 10*9/L (ref 1.1–3.6)
LYMPHOCYTES RELATIVE PERCENT: 32.2 %
MEAN CORPUSCULAR HEMOGLOBIN CONC: 33.5 g/dL (ref 32.0–36.0)
MEAN CORPUSCULAR HEMOGLOBIN: 29.7 pg (ref 25.9–32.4)
MEAN CORPUSCULAR VOLUME: 88.5 fL (ref 77.6–95.7)
MEAN PLATELET VOLUME: 8.2 fL (ref 6.8–10.7)
MONOCYTES ABSOLUTE COUNT: 0.5 10*9/L (ref 0.3–0.8)
MONOCYTES RELATIVE PERCENT: 14.5 %
NEUTROPHILS ABSOLUTE COUNT: 1.6 10*9/L — ABNORMAL LOW (ref 1.8–7.8)
NEUTROPHILS RELATIVE PERCENT: 47.5 %
PLATELET COUNT: 219 10*9/L (ref 150–450)
RED BLOOD CELL COUNT: 3.8 10*12/L — ABNORMAL LOW (ref 3.95–5.13)
RED CELL DISTRIBUTION WIDTH: 13.8 % (ref 12.2–15.2)
WBC ADJUSTED: 3.3 10*9/L — ABNORMAL LOW (ref 3.6–11.2)

## 2024-01-21 LAB — HEPATIC FUNCTION PANEL
ALBUMIN: 3.9 g/dL (ref 3.4–5.0)
ALKALINE PHOSPHATASE: 82 U/L (ref 46–116)
ALT (SGPT): 34 U/L (ref 10–49)
AST (SGOT): 41 U/L — ABNORMAL HIGH (ref <=34)
BILIRUBIN DIRECT: 0.2 mg/dL (ref 0.00–0.30)
BILIRUBIN TOTAL: 0.5 mg/dL (ref 0.3–1.2)
PROTEIN TOTAL: 7.3 g/dL (ref 5.7–8.2)

## 2024-01-21 LAB — BASIC METABOLIC PANEL
ANION GAP: 15 mmol/L — ABNORMAL HIGH (ref 5–14)
BLOOD UREA NITROGEN: 23 mg/dL (ref 9–23)
BUN / CREAT RATIO: 13
CALCIUM: 9.9 mg/dL (ref 8.7–10.4)
CHLORIDE: 104 mmol/L (ref 98–107)
CO2: 23.1 mmol/L (ref 20.0–31.0)
CREATININE: 1.72 mg/dL — ABNORMAL HIGH (ref 0.55–1.02)
EGFR CKD-EPI (2021) FEMALE: 40 mL/min/1.73m2 — ABNORMAL LOW (ref >=60)
GLUCOSE RANDOM: 94 mg/dL (ref 70–99)
POTASSIUM: 4.8 mmol/L (ref 3.4–4.8)
SODIUM: 142 mmol/L (ref 135–145)

## 2024-01-21 LAB — MAGNESIUM: MAGNESIUM: 1.5 mg/dL — ABNORMAL LOW (ref 1.6–2.6)

## 2024-01-21 LAB — PROTEIN / CREATININE RATIO, URINE
CREATININE, URINE: 152.6 mg/dL
PROTEIN URINE: 47.5 mg/dL
PROTEIN/CREAT RATIO, URINE: 0.311

## 2024-01-21 LAB — HEMOGLOBIN A1C
ESTIMATED AVERAGE GLUCOSE: 100 mg/dL
HEMOGLOBIN A1C: 5.1 % (ref 4.8–5.6)

## 2024-01-21 LAB — C3 COMPLEMENT: C3 COMPLEMENT: 108 mg/dL (ref 90–170)

## 2024-01-21 LAB — PHOSPHORUS: PHOSPHORUS: 3.4 mg/dL (ref 2.4–5.1)

## 2024-01-21 LAB — ALBUMIN / CREATININE URINE RATIO
ALBUMIN QUANT URINE: 19.1 mg/dL
ALBUMIN/CREATININE RATIO: 125.2 ug/mg — ABNORMAL HIGH (ref 0.0–30.0)
CREATININE, URINE: 152.6 mg/dL

## 2024-01-21 LAB — BK VIRUS QUANTITATIVE PCR, BLOOD: BK BLOOD RESULT: NOT DETECTED

## 2024-01-21 LAB — PARATHYROID HORMONE (PTH): PARATHYROID HORMONE INTACT: 168 pg/mL — ABNORMAL HIGH (ref 18.4–80.1)

## 2024-01-21 LAB — CMV DNA, QUANTITATIVE, PCR
CMV QUANT: <35 [IU]/mL — ABNORMAL HIGH (ref <0)
CMV VIRAL LD: DETECTED — AB

## 2024-01-21 LAB — TACROLIMUS LEVEL, TROUGH: TACROLIMUS, TROUGH: 11.6 ng/mL (ref 5.0–15.0)

## 2024-01-21 LAB — SLIDE REVIEW

## 2024-01-21 MED ORDER — SODIUM BICARBONATE 650 MG TABLET
ORAL_TABLET | Freq: Two times a day (BID) | ORAL | 11 refills | 30.00000 days | Status: CP
Start: 2024-01-21 — End: 2025-01-20
  Filled 2024-02-24: qty 60, 30d supply, fill #0

## 2024-01-21 MED ORDER — PREDNISONE 5 MG TABLET
ORAL_TABLET | Freq: Every day | ORAL | 11 refills | 30.00000 days | Status: CP
Start: 2024-01-21 — End: ?
  Filled 2024-01-27: qty 30, 30d supply, fill #0

## 2024-01-21 NOTE — Progress Notes (Deleted)
 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 She is seen in follow up of her kidney transplant and immunosuppression management.     S/P deceased donor kidney transplant with C3 glomeruloneprhritis recurrence   - Kidney biopsy 12/29/19 confirmed presence of C3 glomerulopathy recurrence.   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   - Genetics panel was negative for causative variants 08/30/20.     - Treatment of C3G included an increase in the dose of Myfortic  to 720 mg bid and initiation of prednisone  5 mg daily  - Rituximab was deferred at the time of recurrence diagnosis due to an active COVID-19 community spike and concern about infection risk. With improvement in proteinuria on current immunosuppression we have not reconsidered rituximab.  - Serum creatinine is stable at 1.68 mg/dL with recent baseline in the range of 1.2-1.7 mg/dL. Baseline prior to C3 GN recurrence was 0.9-1.4 mg/dL.    - UPC is 0.33 with UACR 122.9 consistent with recent values.    - UA with 30 RBCs, 13 squamous epithelial cells.  - She has been prepared for use of Eculizumab in case current therapy or rituximab proves ineffective in the future. This included completion of pre-eculizumab vaccination     - C3 was normal 11/20/22  - DSA screens have been historically negative for DSAs (most recently 11/20/22)  - BK viral load undetectable 03/26/23    Immunosuppression Management [High Risk Medical Decision Making For Drug Therapy Requiring Intensive Monitoring For Toxicity]:   - Tacrolimus  level is pending (target trough is 6-10 ng/mL).   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 Completed a 10 day course of Cipro   - Denies current UTI symptoms.   - UA today does not appear to be a clean catch  - Will await urine culture results    Immunizations  - COVID-19 vaccination: last booster 01/22/23.   - Received Men B on 01/26/20,03/01/20, 03/21/20, 11/07/21  - Received MenACWY 01/26/20, 04/29/11  - Men C 04/29/11  - Booster doses of meningococcal vaccine every 1-5 years per CDC recommendations if chronic eculizumab therapy is initiated.  - Flu vaccine 01/22/23    Seizures Disorder, no seizures since 2014:    - EEG with no evidence of seizures on 06/25/20 .   - Keppra  stopped > 1 year ago with no recurrence of seizures  - Follow up with Lauraine Born, NP at [facility].       Myoclonus:   - Patient linked worsening myoclonus with prednisone  and is now taking prednisone  only 2 days per week.  - Will  continue prn clonazepam   - Continue [facility] physical therapy.      Metabolic Acidosis  - CO2 is 26.8   - Continue sodium bicarbonate  650 mg bid      Pseudoaneurysm of AVF  She saw Dr. Aundria 09/21/19 with plan to defer any intervention until 1 year had passed from transplant. She has no high output heart failure by echo or symptoms of CHF. We would favor a nonurgent revision to minimize any risk of rupture, but spare the AVF for future use if possible to do so safely. Patient has been advised to contact us  with any scabbing or erosion of the skin over the pseudoaneurysm.  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    Pap Smear: 11/11/20 was negative including negative HPV.   - GYN follow up scheduled for next week  Renal US  01/15/23 with no masses in native kidneys    Follow up  Transplant clinic in 4 months    History of Present Illness  Carolyn Kelley is a 33 y.o. 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 Her post-operative course was complicated by hypotension and hypoxia thought to be caused by an anaphylactic reaction to Campath. Due to presence of proteinuria she underwent kidney biopsy on 12/29/19 revealing C3 glomerulopathy recurrence. In response to this diagnosis Myfortic  was increased from 540 mg bid to 720 mg bid and she was started on prednisone  5 mg daily while tacrolimus  was continued. Creatinine has subsequently been stable and UPC  has remained < 0.5. She has experienced no episodes of rejection or infectious complications.     No complaints  Wants vaccines  Klonopin  controlling cramps  Not checking BP     She continues to have tremors and headaches. These are not worse. Headaches are typically responsive to acetaminophen . She denies seizure activity. Myoclonus is unchanged and treated with Klonopin .     She denies dysuria, allograft site tenderness, chest pain, shortness of breath, edema, nausea, vomiting, diarrhea, or constipation.  The pseudoaneurysm site is unchanged.     She has not been checking home BP values. Mg and NaHCO3 are taken intermittently. She is adherent with other medications.    Immunosuppression: Envarsus  6 mg daily, Myfortic  720 mg bid, and prednisone  5 mg 3 days per week.     Last dose of Envarsus : 11 AM yesterday    Transplant History:  Date of Transplant: 03/10/19  Organ Received: Left DDKT, DBD, SCD, KDPI 2%; cold ischemia 18 hrs.  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    Medications  Current Outpatient Medications   Medication Sig Dispense Refill  acetaminophen  (TYLENOL ) 500 MG tablet Take 1-2 tablets (500-1,000 mg total) by mouth every six (6) hours as needed for pain or fever (> 38C). 100 tablet 0    clonazePAM  (KLONOPIN ) 0.5 MG tablet Take 1 tablet (0.5 mg total) by mouth two (2) times a day. 60 tablet 3    magnesium oxide-Mg AA chelate (MAGNESIUM, AMINO ACID CHELATE,) 133 mg Take 2 tablets by mouth Two (2) times a day. 120 tablet 11    mycophenolate  (MYFORTIC ) 180 MG EC tablet Take 4 tablets (720 mg total) by mouth Two (2) times a day. Adjust dose per medication card. 240 tablet 11    predniSONE  (DELTASONE ) 5 MG tablet Take 1 tablet (5 mg total) by mouth daily. 30 tablet 11    sodium bicarbonate  650 mg tablet Take 1 tablet (650 mg total) by mouth Two (2) times a day. 60 tablet 11    tacrolimus  (ENVARSUS  XR) 1 mg Tb24 extended release tablet Take 2 tablets (2 mg total) by mouth daily. Take in addition to ONE 4mg  tab to equal 6mg  daily dose 60 tablet 11    tacrolimus  (ENVARSUS  XR) 4 mg Tb24 extended release tablet Take 1 tablet (4 mg total) by mouth daily. Take in addition to TWO 1mg  tabs to equal 6mg  daily dose 30 tablet 11    tretinoin  (RETIN-A ) 0.025 % cream APPLY TO AFFECTED AREA(S) EVERY NIGHT AT BEDTIME AS TOLERATED 45 g 3     No current facility-administered medications for this visit.       Physical Exam   BP 126/84 (BP Site: R Arm, BP Position: Sitting, BP Cuff Size: Large)  - Pulse 97  - Temp 36.5 ??C (97.7 ??F) (Temporal)  - Wt 80.6 kg (177 lb 12.8 oz)  - BMI 32.52 kg/m??    General: Patient is a pleasant female appears drowsy seated in wheelchair  Eyes: Sclera anicteric.  Lungs: Clear to auscultation bilaterally, no wheezes/rales/rhonchi.  Cardiovascular: Regular rate and rhythm without murmurs, rubs or gallops.  Abdomen: soft, non-tender  Extremities: Without edema, joints without evidence of synovitis; pseudoaneurysm of AVF LUE with no scabs or further thinning of skin over site.  Skin: Without rash  Neurological: uses a walker for ambulation due to myoclonus.       Laboratory Results and imaging Reviewed in EMR

## 2024-01-21 NOTE — Progress Notes (Signed)
 Carolyn Kelley 's Envarsus  and Mycophenolate   shipment will be delayed as a result of the medication is too soon to refill until Envarsus  1 mg 11/19 and Mycophenolate  11/06 .     I have reached out to the patient  at [phone] and left a voicemail message.  We will wait for a call back from the patient to reschedule the delivery.  We have not confirmed the new delivery date.

## 2024-01-21 NOTE — Progress Notes (Signed)
 Saw patient in clinic, doing well    Labs today and took envarsus  at 11am yesterday    BP in clinic 126/84    Drinking well    Appetite good    Sleeping well    Denies HA, dizziness, CP, heart palpitations, SOB, swelling, abdominal pain, n/v/d, urinary problems, tremors, or fevers

## 2024-01-21 NOTE — Progress Notes (Signed)
 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 She is seen in follow up of her kidney transplant and immunosuppression management.     S/P deceased donor kidney transplant with C3 glomeruloneprhritis recurrence   - Kidney biopsy 12/29/19 confirmed presence of C3 glomerulopathy recurrence.  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   - Genetics panel was negative for causative variants 08/30/20.     - Treatment of C3G included an increase in the dose of Myfortic  to 720 mg bid and initiation of prednisone  5 mg daily  - Rituximab was deferred at the time of recurrence diagnosis due to an active COVID-19 community spike and concern about infection risk. With improvement in proteinuria on current immunosuppression we have not reconsidered rituximab.  - Serum creatinine is stable at 1.72 mg/dL with recent baseline in the range of 1.2-1.7 mg/dL. Baseline prior to C3 GN recurrence was 0.9-1.4 mg/dL.    - UPC is 0.311 with UACR 125.2 consistent with recent values.    - UA with 23 RBCs, 8 squamous epithelial cells.  - She has been prepared for use of Eculizumab in case current therapy or rituximab proves ineffective in the future. This included completion of pre-eculizumab vaccination     - C3 was normal 11/20/22 and is normal again today  - DSA screens have been historically negative for DSAs (most recently 11/20/22)  - BK viral load undetectable 08/20/23    Immunosuppression Management [High Risk Medical Decision Making For Drug Therapy Requiring Intensive Monitoring For Toxicity]:   - Tacrolimus  level is pending (target trough is 6-10 ng/mL). Level have been high in the past, may decrease dosage pending lab results.   - Tremors and headaches preceded initiation of Envarsus  and are still present  - Will continue Envarsus  6 mg daily   - Continue Myfortic  720 mg bid due to C3 glomerulopathy recurrence  - Prednisone  5 mg 2 days per week due to patient intolerance of daily dosing due to myoclonus    Leukopenia, neutropenia  - WBC 3.3   - Likely due to Myfortic   - No dose change in Myfortic  is necessary at this level of leukopenia    History of UTI    - Last UTI E.coli 09/26/21 with urosepsis and AKI. Completed a 10 day course of Cipro   - Denies current UTI symptoms.   - UA today does not appear to be a clean catch  - Will await urine culture results    Immunizations  - COVID-19 vaccination: last booster 01/22/23.- Plan for vaccination today  - Received Men B on 01/26/20,03/01/20, 03/21/20, 11/07/21  - Received MenACWY 01/26/20, 04/29/11  - Men C 04/29/11  - Booster doses of meningococcal vaccine every 1-5 years per CDC recommendations if chronic eculizumab therapy is initiated.  - Flu vaccine 01/22/23- Plan for vaccination today     Seizures Disorder, no seizures since 2014:    - EEG with no evidence of seizures on 06/25/20 .   - Keppra  stopped > 1 year ago with no recurrence of seizures  - Follow up with Lauraine Born, NP at [facility].       Myoclonus:   - Patient linked worsening myoclonus with prednisone  and is now taking prednisone  only 2 days per week.  - Will continue prn  clonazepam   - Continue [facility] physical therapy.      Metabolic Acidosis  - CO2 is 26.8   - Continue sodium bicarbonate  650 mg bid      Pseudoaneurysm of AVF  She saw Dr. Aundria 09/21/19 with plan to defer any intervention until 1 year had passed from transplant. She has no high output heart failure by echo or symptoms of CHF. We would favor a nonurgent revision to minimize any risk of rupture, but spare the AVF for future use if possible to do so safely. Patient has been advised to contact us  with any scabbing or erosion of the skin over the pseudoaneurysm.  - She plans to see Dr. 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    Pap Smear: 11/11/20 was negative including negative HPV.   - GYN follow up scheduled   Renal US  01/15/23 with no masses in native kidneys    Follow up  Transplant clinic in 4 months    History of Present Illness  Carolyn Kelley is a 33 y.o. 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 Her post-operative course was complicated by hypotension and hypoxia thought to be caused by an anaphylactic reaction to Campath. Due to presence of proteinuria she underwent kidney biopsy on 12/29/19 revealing C3 glomerulopathy recurrence. In response to this diagnosis Myfortic  was increased from 540 mg bid to 720 mg bid and she was started on prednisone  5 mg daily while tacrolimus  was continued. Patient was unable to tolerate prednisone  5 mg daily and is currently only able to tolerate prednisone  5 mg twice a week. Creatinine has subsequently been stable and UPC  has remained < 0.5. She has experienced no episodes of rejection or infectious complications.     She continues to have tremors which are not worse. Headaches have resolved. She denies seizure activity. Myoclonus is unchanged and treated with Klonopin .     She denies dysuria, allograft site tenderness, chest pain, shortness of breath, edema, nausea, vomiting, diarrhea, or constipation.  The pseudoaneurysm site is unchanged.     She has not been checking home BP values. Mg and NaHCO3 are taken intermittently. She is adherent with other medications.    Immunosuppression: Envarsus  6 mg daily, Myfortic  720 mg bid, and prednisone  5 mg 2 days per week.     Last dose of Envarsus : 11 AM yesterday    Transplant History:  Date of Transplant: 03/10/19  Organ Received: Left DDKT, DBD, SCD, KDPI 2%; cold ischemia 18 hrs.  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    Medications  Current Outpatient Medications   Medication Sig Dispense Refill    acetaminophen  (TYLENOL ) 500 MG tablet  Take 1-2 tablets (500-1,000 mg total) by mouth every six (6) hours as needed for pain or fever (> 38C). 100 tablet 0    clonazePAM  (KLONOPIN ) 0.5 MG tablet Take 1 tablet (0.5 mg total) by mouth two (2) times a day. 60 tablet 3    magnesium oxide-Mg AA chelate (MAGNESIUM, AMINO ACID CHELATE,) 133 mg Take 2 tablets by mouth Two (2) times a day. 120 tablet 11    mycophenolate  (MYFORTIC ) 180 MG EC tablet Take 4 tablets (720 mg total) by mouth Two (2) times a day. Adjust dose per medication card. 240 tablet 11    sodium bicarbonate  650 mg tablet Take 1 tablet (650 mg total) by mouth Two (2) times a day. 60 tablet 11    tacrolimus  (ENVARSUS  XR) 1 mg Tb24 extended release tablet Take 2 tablets (2 mg total) by mouth daily. Take in addition to ONE 4mg  tab to equal 6mg  daily dose 60 tablet 11    tacrolimus  (ENVARSUS  XR) 4 mg Tb24 extended release tablet Take 1 tablet (4 mg total) by mouth daily. Take in addition to TWO 1mg  tabs to equal 6mg  daily dose 30 tablet 11    tretinoin  (RETIN-A ) 0.025 % cream APPLY TO AFFECTED AREA(S) EVERY NIGHT AT BEDTIME AS TOLERATED 45 g 3    predniSONE  (DELTASONE ) 5 MG tablet Take 1 tablet (5 mg total) by mouth daily. 30 tablet 11     No current facility-administered medications for this visit.       Physical Exam   BP 126/84 (BP Site: R Arm, BP Position: Sitting, BP Cuff Size: Large)  - Pulse 97  - Temp 36.5 ??C (97.7 ??F) (Temporal)  - Wt 80.6 kg (177 lb 12.8 oz)  - BMI 32.52 kg/m??    General: Patient is a pleasant female appears drowsy seated in wheelchair  Eyes: Sclera anicteric.  Lungs: Clear to auscultation bilaterally, no wheezes/rales/rhonchi.  Cardiovascular: Regular rate and rhythm without murmurs, rubs or gallops.  Abdomen: soft, non-tender  Extremities: Without edema, joints without evidence of synovitis; pseudoaneurysm of AVF LUE with no scabs or further thinning of skin over site.  Skin: Without rash  Neurological: uses a walker for ambulation due to myoclonus.       Laboratory Results and imaging Reviewed in EMR

## 2024-01-21 NOTE — Telephone Encounter (Signed)
 Pt request for RX Refill

## 2024-01-24 LAB — VITAMIN D 25 HYDROXY: VITAMIN D, TOTAL (25OH): 36 ng/mL (ref 20.0–80.0)

## 2024-01-26 NOTE — Progress Notes (Signed)
 Carolyn Kelley 's tacrolimus : ENVARSUS  XR 4 mg Tb24 extended release tablet mycophenolate  180 MG EC tablet (MYFORTIC ) predniSONE  5 MG tablet (DELTASONE ) shipment will be rescheduled as a result of tacrolimus : ENVARSUS  XR 1 mg Tb24 extended release tablet is refill too soon and has been removed from order.    Carolyn Kelley 's Envarsus  1 mg  shipment will be cancelled as a result of the medication is too soon to refill until Envarsus  1 mg 11/19. Patient will pick up at local pharmacy that has filled prescription.       I have reached out to the patient  via incoming phone call and communicated the delay. We will reschedule the medication for the delivery date that the patient agreed upon.  We have confirmed the delivery date as 11/7, via ups.

## 2024-01-27 MED FILL — MYCOPHENOLATE SODIUM 180 MG TABLET,DELAYED RELEASE: ORAL | 30 days supply | Qty: 240 | Fill #1

## 2024-01-28 DIAGNOSIS — Z940 Kidney transplant status: Principal | ICD-10-CM

## 2024-01-28 DIAGNOSIS — Z79899 Other long term (current) drug therapy: Principal | ICD-10-CM

## 2024-01-28 LAB — HLA DS POST TRANSPLANT
ANTI-DONOR DRW #1 MFI: 4 MFI
ANTI-DONOR DRW #2 MFI: 0 MFI
ANTI-DONOR HLA-A #1 MFI: 0 MFI
ANTI-DONOR HLA-A #2 MFI: 0 MFI
ANTI-DONOR HLA-C #1 MFI: 0 MFI
ANTI-DONOR HLA-C #2 MFI: 32 MFI
ANTI-DONOR HLA-DP AG #1 MFI: 25 MFI
ANTI-DONOR HLA-DQB #1 MFI: 117 MFI
ANTI-DONOR HLA-DQB #2 MFI: 164 MFI
ANTI-DONOR HLA-DR #1 MFI: 0 MFI

## 2024-01-28 LAB — FSAB CLASS 1 ANTIBODY SPECIFICITY: HLA CLASS 1 ANTIBODY RESULT: NEGATIVE

## 2024-01-28 LAB — FSAB CLASS 2 ANTIBODY SPECIFICITY: HLA CL2 AB RESULT: NEGATIVE

## 2024-01-28 MED ORDER — TACROLIMUS XR 1 MG TABLET,EXTENDED RELEASE 24 HR
ORAL_TABLET | Freq: Every day | ORAL | 11 refills | 30.00000 days | Status: CP
Start: 2024-01-28 — End: 2025-01-27
  Filled 2024-01-27: qty 30, 30d supply, fill #4
  Filled 2024-02-24: qty 30, 30d supply, fill #0

## 2024-01-28 MED ORDER — TACROLIMUS XR 4 MG TABLET,EXTENDED RELEASE 24 HR
ORAL_TABLET | Freq: Every day | ORAL | 11 refills | 30.00000 days | Status: CP
Start: 2024-01-28 — End: ?

## 2024-01-28 NOTE — Progress Notes (Addendum)
 [facility] Pharmacist has reviewed a new prescription for envarsus  that indicates a dose decrease.  Patient was counseled on this dosage change by CC- see epic note from 11/7.  Next refill call date adjusted if necessary.      Clinical Assessment Needed For: Dose Change  Medication: Envarsus  xr 1 mg   Last Fill Date/Day Supply: 03/11/2023 / 30  Copay $4.00/90  Was previous dose already scheduled to fill: No    Notes to Pharmacist: n/a        Clinical Assessment Needed For: Dose Change  Medication: Envarsus  xr 4 mg   Last Fill Date/Day Supply: 01/27/2024 / 30  Refill Too Soon until 02/18/2024  Was previous dose already scheduled to fill: No    Notes to Pharmacist: Will retest

## 2024-01-28 NOTE — Telephone Encounter (Signed)
 Reviewed recent tac level 11.6, pt advised to reduce envarsus  to 5 mg daily.  She will repeat labs in 2 weeks.

## 2024-02-21 NOTE — Progress Notes (Signed)
 Therapy Update Follow Up: No issues - Copay = $4.00/90

## 2024-02-23 NOTE — Progress Notes (Signed)
 [facility] Refill Coordination Note    Specialty Medication(s) to be Shipped:   Transplant: Envarsus  4mg  and mycophenolate  mofetil 180mg     Other medication(s) to be shipped: sodium bicarbonate     Specialty Medications not needed at this time: Transplant: Envarsus  1mg      Carolyn Kelley, DOB: 07/05/1990  Phone: [phone] (home)       All above HIPAA information was verified with patient.     Was a nurse, learning disability used for this call? No    Completed refill call assessment today to schedule patient's medication shipment from the [facility]  [phone]).  All relevant notes have been reviewed.     Specialty medication(s) and dose(s) confirmed: Regimen is correct and unchanged.   Changes to medications: Rossie reports no changes at this time.  Changes to insurance: No  New side effects reported not previously addressed with a pharmacist or physician: None reported  Questions for the pharmacist: No    Confirmed patient received a Conservation Officer, Historic Buildings and a Surveyor, Mining with first shipment. The patient will receive a drug information handout for each medication shipped and additional FDA Medication Guides as required.       DISEASE/MEDICATION-SPECIFIC INFORMATION        N/A    SPECIALTY MEDICATION ADHERENCE     Medication Adherence    Patient reported X missed doses in the last month: 0  Specialty Medication: mycophenolate  180 MG EC tablet (MYFORTIC )  Patient is on additional specialty medications: Yes  Additional Specialty Medications: tacrolimus : ENVARSUS  XR 4 mg Tb24 extended release tablet  Patient is on more than two specialty medications: No  Any gaps in refill history greater than 2 weeks in the last 3 months: no  Demonstrates understanding of importance of adherence: yes  Informant: patient  Confirmed plan for next specialty medication refill: delivery by pharmacy  Refills needed for supportive medications: not needed          Refill Coordination    Has the Patients' Contact Information Changed: No  Is the Shipping Address Different: No         Were doses missed due to medication being on hold? No    mycophenolate  180  mg: 10 days of medicine on hand   tacrolimus : ENVARSUS  XR 4  mg: 7 days of medicine on hand       REFERRAL TO PHARMACIST     Referral to the pharmacist: Not needed      [facility]     Shipping address confirmed in Epic.     Cost and Payment: Patient has a copay of $12.50. They are aware and have authorized the pharmacy to charge the credit card on file.    Delivery Scheduled: Yes, Expected medication delivery date: 02/25/24.     Medication will be delivered via UPS to the prescription address in Epic WAM.    Suzen Blood   [facility]  Specialty Technician

## 2024-02-24 MED FILL — MYCOPHENOLATE SODIUM 180 MG TABLET,DELAYED RELEASE: ORAL | 30 days supply | Qty: 240 | Fill #2

## 2024-03-13 NOTE — Progress Notes (Signed)
 TRF UNOS form

## 2024-03-29 DIAGNOSIS — Z940 Kidney transplant status: Principal | ICD-10-CM

## 2024-03-29 DIAGNOSIS — Z79899 Other long term (current) drug therapy: Principal | ICD-10-CM

## 2024-03-29 MED ORDER — ENVARSUS XR 1 MG TABLET,EXTENDED RELEASE
ORAL_TABLET | 0 refills | 0.00000 days
Start: 2024-03-29 — End: ?

## 2024-03-29 NOTE — Progress Notes (Signed)
 [facility] Refill Coordination Note    Specialty Medication(s) to be Shipped:   Transplant: mycophenolate  and Envarsus  4mg     Other medication(s) to be shipped: sodium bicarbonate     Specialty Medications not needed at this time: Transplant: Envarsus  1mg  and mycophenolate  (gets from a different pharmacy)     Carolyn Kelley, DOB: 1991-02-09  Phone: [phone] (home)       All above HIPAA information was verified with patient.     Was a nurse, learning disability used for this call? No    Completed refill call assessment today to schedule patient's medication shipment from the [facility]  [phone]).  All relevant notes have been reviewed.     Specialty medication(s) and dose(s) confirmed: Regimen is correct and unchanged.   Changes to medications: Terresa reports no changes at this time.  Changes to insurance: No  New side effects reported not previously addressed with a pharmacist or physician: None reported  Questions for the pharmacist: No    Confirmed patient received a Conservation Officer, Historic Buildings and a Surveyor, Mining with first shipment. The patient will receive a drug information handout for each medication shipped and additional FDA Medication Guides as required.       DISEASE/MEDICATION-SPECIFIC INFORMATION        N/A    SPECIALTY MEDICATION ADHERENCE     Medication Adherence    Patient reported X missed doses in the last month: 1  Specialty Medication: mycophenolate  180 MG EC tablet (MYFORTIC )  Patient is on additional specialty medications: Yes  Additional Specialty Medications: tacrolimus : ENVARSUS  XR 4 mg Tb24 extended release tablet  Patient Reported Additional Medication X Missed Doses in the Last Month: 1  Patient is on more than two specialty medications: No  Informant: patient  Confirmed plan for next specialty medication refill: delivery by pharmacy  Refills needed for supportive medications: not needed          Refill Coordination    Has the Patients' Contact Information Changed: No  Is the Shipping Address Different: No         Were doses missed due to medication being on hold? No    Envarsus  4 mg: 4 days of medicine on hand   mycophenolate  180 mg: 3 doses of medicine on hand     Specialty medication is an injection or given on a cycle: No    REFERRAL TO PHARMACIST     Referral to the pharmacist: Not needed      [facility]     Shipping address confirmed in Epic.     Cost and Payment: Patient has a copay of $4 Envarsus  and $13.50 sodium bicarb. They are aware and have authorized the pharmacy to charge the credit card on file.    Delivery Scheduled: Yes, Expected medication delivery date: 03/31/24.     Medication will be delivered via UPS to the prescription address in Epic WAM.    Aneita DELENA Merck, [facility]   [facility]  Specialty Pharmacist

## 2024-03-30 MED ORDER — ENVARSUS XR 1 MG TABLET,EXTENDED RELEASE
ORAL_TABLET | ORAL | 6 refills | 0.00000 days | Status: CP
Start: 2024-03-30 — End: ?

## 2024-03-30 MED FILL — ENVARSUS XR 4 MG TABLET,EXTENDED RELEASE: ORAL | 30 days supply | Qty: 30 | Fill #1

## 2024-03-30 MED FILL — SODIUM BICARBONATE 650 MG TABLET: ORAL | 90 days supply | Qty: 180 | Fill #1

## 2024-04-28 ENCOUNTER — Other Ambulatory Visit: Payer: Self-pay | Admitting: Physical Medicine & Rehabilitation

## 2024-04-28 DIAGNOSIS — G253 Myoclonus: Secondary | ICD-10-CM

## 2024-07-19 ENCOUNTER — Ambulatory Visit: Admitting: Physical Medicine & Rehabilitation
